# Patient Record
Sex: Female | Born: 1935 | Race: White | Hispanic: No | Smoking: Former smoker
Health system: Southern US, Community
[De-identification: ages and names within clinical notes are randomized; demographics above are authoritative.]

## PROBLEM LIST (undated history)

## (undated) DIAGNOSIS — J302 Other seasonal allergic rhinitis: Secondary | ICD-10-CM

## (undated) DIAGNOSIS — Z8619 Personal history of other infectious and parasitic diseases: Secondary | ICD-10-CM

## (undated) DIAGNOSIS — J449 Chronic obstructive pulmonary disease, unspecified: Secondary | ICD-10-CM

## (undated) DIAGNOSIS — K5790 Diverticulosis of intestine, part unspecified, without perforation or abscess without bleeding: Secondary | ICD-10-CM

## (undated) DIAGNOSIS — J90 Pleural effusion, not elsewhere classified: Secondary | ICD-10-CM

## (undated) DIAGNOSIS — L4050 Arthropathic psoriasis, unspecified: Secondary | ICD-10-CM

## (undated) DIAGNOSIS — F32A Depression, unspecified: Secondary | ICD-10-CM

## (undated) DIAGNOSIS — F329 Major depressive disorder, single episode, unspecified: Secondary | ICD-10-CM

## (undated) DIAGNOSIS — Z9981 Dependence on supplemental oxygen: Secondary | ICD-10-CM

## (undated) DIAGNOSIS — K222 Esophageal obstruction: Secondary | ICD-10-CM

## (undated) DIAGNOSIS — I5032 Chronic diastolic (congestive) heart failure: Secondary | ICD-10-CM

## (undated) DIAGNOSIS — G620 Drug-induced polyneuropathy: Secondary | ICD-10-CM

## (undated) DIAGNOSIS — Z1379 Encounter for other screening for genetic and chromosomal anomalies: Secondary | ICD-10-CM

## (undated) DIAGNOSIS — T451X5A Adverse effect of antineoplastic and immunosuppressive drugs, initial encounter: Secondary | ICD-10-CM

## (undated) DIAGNOSIS — K449 Diaphragmatic hernia without obstruction or gangrene: Secondary | ICD-10-CM

## (undated) DIAGNOSIS — Z800 Family history of malignant neoplasm of digestive organs: Secondary | ICD-10-CM

## (undated) DIAGNOSIS — K52832 Lymphocytic colitis: Secondary | ICD-10-CM

## (undated) DIAGNOSIS — E039 Hypothyroidism, unspecified: Secondary | ICD-10-CM

## (undated) DIAGNOSIS — M797 Fibromyalgia: Secondary | ICD-10-CM

## (undated) DIAGNOSIS — F419 Anxiety disorder, unspecified: Secondary | ICD-10-CM

## (undated) DIAGNOSIS — M810 Age-related osteoporosis without current pathological fracture: Secondary | ICD-10-CM

## (undated) DIAGNOSIS — R180 Malignant ascites: Secondary | ICD-10-CM

## (undated) DIAGNOSIS — I480 Paroxysmal atrial fibrillation: Secondary | ICD-10-CM

## (undated) DIAGNOSIS — D500 Iron deficiency anemia secondary to blood loss (chronic): Secondary | ICD-10-CM

## (undated) DIAGNOSIS — M25559 Pain in unspecified hip: Secondary | ICD-10-CM

## (undated) DIAGNOSIS — K529 Noninfective gastroenteritis and colitis, unspecified: Secondary | ICD-10-CM

## (undated) DIAGNOSIS — K219 Gastro-esophageal reflux disease without esophagitis: Secondary | ICD-10-CM

## (undated) DIAGNOSIS — Z8719 Personal history of other diseases of the digestive system: Secondary | ICD-10-CM

## (undated) DIAGNOSIS — K589 Irritable bowel syndrome without diarrhea: Secondary | ICD-10-CM

## (undated) DIAGNOSIS — C569 Malignant neoplasm of unspecified ovary: Secondary | ICD-10-CM

## (undated) HISTORY — PX: KNEE ARTHROSCOPY W/ LATERAL RELEASE: SHX1873

## (undated) HISTORY — DX: Irritable bowel syndrome, unspecified: K58.9

## (undated) HISTORY — DX: Major depressive disorder, single episode, unspecified: F32.9

## (undated) HISTORY — DX: Encounter for other screening for genetic and chromosomal anomalies: Z13.79

## (undated) HISTORY — DX: Malignant neoplasm of unspecified ovary: C56.9

## (undated) HISTORY — PX: LAPAROSCOPIC CHOLECYSTECTOMY: SUR755

## (undated) HISTORY — DX: Depression, unspecified: F32.A

## (undated) HISTORY — PX: COLONOSCOPY: SHX174

## (undated) HISTORY — DX: Esophageal obstruction: K22.2

## (undated) HISTORY — DX: Gastro-esophageal reflux disease without esophagitis: K21.9

## (undated) HISTORY — DX: Chronic obstructive pulmonary disease, unspecified: J44.9

## (undated) HISTORY — PX: OTHER SURGICAL HISTORY: SHX169

## (undated) HISTORY — DX: Fibromyalgia: M79.7

## (undated) HISTORY — DX: Age-related osteoporosis without current pathological fracture: M81.0

## (undated) HISTORY — DX: Arthropathic psoriasis, unspecified: L40.50

## (undated) HISTORY — DX: Diverticulosis of intestine, part unspecified, without perforation or abscess without bleeding: K57.90

## (undated) HISTORY — DX: Personal history of other diseases of the digestive system: Z87.19

## (undated) HISTORY — PX: EXAM UNDER ANESTHESIA WITH MANIPULATION OF KNEE: SHX5816

## (undated) HISTORY — DX: Paroxysmal atrial fibrillation: I48.0

## (undated) HISTORY — PX: TOTAL KNEE ARTHROPLASTY: SHX125

## (undated) HISTORY — DX: Family history of malignant neoplasm of digestive organs: Z80.0

## (undated) HISTORY — DX: Hypothyroidism, unspecified: E03.9

## (undated) HISTORY — DX: Personal history of other infectious and parasitic diseases: Z86.19

## (undated) HISTORY — DX: Pain in unspecified hip: M25.559

## (undated) HISTORY — DX: Lymphocytic colitis: K52.832

## (undated) HISTORY — DX: Diaphragmatic hernia without obstruction or gangrene: K44.9

---

## 1987-06-09 ENCOUNTER — Encounter: Payer: Self-pay | Admitting: Internal Medicine

## 1998-12-20 ENCOUNTER — Encounter: Payer: Self-pay | Admitting: Internal Medicine

## 2000-08-23 ENCOUNTER — Encounter: Payer: Self-pay | Admitting: Internal Medicine

## 2000-09-27 ENCOUNTER — Encounter: Payer: Self-pay | Admitting: Internal Medicine

## 2001-11-10 ENCOUNTER — Ambulatory Visit (HOSPITAL_COMMUNITY): Admission: RE | Admit: 2001-11-10 | Discharge: 2001-11-10 | Payer: Self-pay | Admitting: Internal Medicine

## 2001-11-10 ENCOUNTER — Encounter: Payer: Self-pay | Admitting: Internal Medicine

## 2002-03-06 ENCOUNTER — Ambulatory Visit (HOSPITAL_COMMUNITY): Admission: RE | Admit: 2002-03-06 | Discharge: 2002-03-06 | Payer: Self-pay | Admitting: Internal Medicine

## 2002-03-06 ENCOUNTER — Encounter: Payer: Self-pay | Admitting: Internal Medicine

## 2002-06-18 ENCOUNTER — Encounter: Admission: RE | Admit: 2002-06-18 | Discharge: 2002-06-18 | Payer: Self-pay | Admitting: Internal Medicine

## 2002-06-18 ENCOUNTER — Encounter: Payer: Self-pay | Admitting: Internal Medicine

## 2002-10-11 ENCOUNTER — Emergency Department (HOSPITAL_COMMUNITY): Admission: EM | Admit: 2002-10-11 | Discharge: 2002-10-11 | Payer: Self-pay | Admitting: Emergency Medicine

## 2002-10-11 ENCOUNTER — Encounter: Payer: Self-pay | Admitting: Emergency Medicine

## 2002-11-26 ENCOUNTER — Ambulatory Visit (HOSPITAL_COMMUNITY): Admission: RE | Admit: 2002-11-26 | Discharge: 2002-11-26 | Payer: Self-pay | Admitting: Internal Medicine

## 2002-11-26 ENCOUNTER — Encounter: Payer: Self-pay | Admitting: Internal Medicine

## 2003-09-08 ENCOUNTER — Inpatient Hospital Stay (HOSPITAL_COMMUNITY): Admission: RE | Admit: 2003-09-08 | Discharge: 2003-09-14 | Payer: Self-pay | Admitting: Orthopedic Surgery

## 2003-09-27 ENCOUNTER — Encounter (HOSPITAL_COMMUNITY): Admission: RE | Admit: 2003-09-27 | Discharge: 2003-10-27 | Payer: Self-pay | Admitting: Orthopedic Surgery

## 2003-10-11 ENCOUNTER — Encounter (HOSPITAL_COMMUNITY): Admission: RE | Admit: 2003-10-11 | Discharge: 2003-11-10 | Payer: Self-pay | Admitting: Orthopedic Surgery

## 2003-12-10 ENCOUNTER — Ambulatory Visit (HOSPITAL_COMMUNITY): Admission: RE | Admit: 2003-12-10 | Discharge: 2003-12-10 | Payer: Self-pay | Admitting: Internal Medicine

## 2003-12-20 ENCOUNTER — Ambulatory Visit (HOSPITAL_BASED_OUTPATIENT_CLINIC_OR_DEPARTMENT_OTHER): Admission: RE | Admit: 2003-12-20 | Discharge: 2003-12-20 | Payer: Self-pay | Admitting: Orthopedic Surgery

## 2004-02-28 ENCOUNTER — Encounter (INDEPENDENT_AMBULATORY_CARE_PROVIDER_SITE_OTHER): Payer: Self-pay | Admitting: *Deleted

## 2004-02-28 ENCOUNTER — Ambulatory Visit (HOSPITAL_COMMUNITY): Admission: RE | Admit: 2004-02-28 | Discharge: 2004-02-28 | Payer: Self-pay | Admitting: Internal Medicine

## 2004-03-24 ENCOUNTER — Ambulatory Visit (HOSPITAL_COMMUNITY): Admission: RE | Admit: 2004-03-24 | Discharge: 2004-03-24 | Payer: Self-pay | Admitting: Internal Medicine

## 2004-04-03 ENCOUNTER — Encounter: Admission: RE | Admit: 2004-04-03 | Discharge: 2004-04-03 | Payer: Self-pay | Admitting: Orthopedic Surgery

## 2004-08-30 ENCOUNTER — Ambulatory Visit (HOSPITAL_COMMUNITY): Admission: RE | Admit: 2004-08-30 | Discharge: 2004-08-30 | Payer: Self-pay | Admitting: Internal Medicine

## 2004-08-30 ENCOUNTER — Ambulatory Visit: Payer: Self-pay | Admitting: Cardiology

## 2004-11-07 ENCOUNTER — Ambulatory Visit (HOSPITAL_COMMUNITY): Admission: RE | Admit: 2004-11-07 | Discharge: 2004-11-07 | Payer: Self-pay | Admitting: Internal Medicine

## 2004-12-11 ENCOUNTER — Ambulatory Visit (HOSPITAL_COMMUNITY): Admission: RE | Admit: 2004-12-11 | Discharge: 2004-12-11 | Payer: Self-pay | Admitting: Internal Medicine

## 2005-01-25 ENCOUNTER — Ambulatory Visit (HOSPITAL_COMMUNITY): Admission: RE | Admit: 2005-01-25 | Discharge: 2005-01-25 | Payer: Self-pay | Admitting: Internal Medicine

## 2005-07-25 ENCOUNTER — Encounter: Admission: RE | Admit: 2005-07-25 | Discharge: 2005-07-25 | Payer: Self-pay | Admitting: Orthopedic Surgery

## 2005-08-28 ENCOUNTER — Ambulatory Visit (HOSPITAL_COMMUNITY): Admission: RE | Admit: 2005-08-28 | Discharge: 2005-08-28 | Payer: Self-pay | Admitting: Orthopedic Surgery

## 2005-09-03 ENCOUNTER — Ambulatory Visit (HOSPITAL_COMMUNITY): Admission: RE | Admit: 2005-09-03 | Discharge: 2005-09-03 | Payer: Self-pay | Admitting: Orthopedic Surgery

## 2005-09-03 ENCOUNTER — Ambulatory Visit (HOSPITAL_BASED_OUTPATIENT_CLINIC_OR_DEPARTMENT_OTHER): Admission: RE | Admit: 2005-09-03 | Discharge: 2005-09-03 | Payer: Self-pay | Admitting: Orthopedic Surgery

## 2006-04-08 ENCOUNTER — Ambulatory Visit (HOSPITAL_COMMUNITY): Admission: RE | Admit: 2006-04-08 | Discharge: 2006-04-08 | Payer: Self-pay | Admitting: Internal Medicine

## 2006-07-03 ENCOUNTER — Ambulatory Visit (HOSPITAL_COMMUNITY): Admission: RE | Admit: 2006-07-03 | Discharge: 2006-07-03 | Payer: Self-pay | Admitting: Internal Medicine

## 2006-10-09 ENCOUNTER — Ambulatory Visit (HOSPITAL_COMMUNITY): Admission: RE | Admit: 2006-10-09 | Discharge: 2006-10-09 | Payer: Self-pay | Admitting: Internal Medicine

## 2007-02-10 ENCOUNTER — Ambulatory Visit (HOSPITAL_COMMUNITY): Admission: RE | Admit: 2007-02-10 | Discharge: 2007-02-10 | Payer: Self-pay | Admitting: Internal Medicine

## 2007-04-18 ENCOUNTER — Ambulatory Visit (HOSPITAL_COMMUNITY): Admission: RE | Admit: 2007-04-18 | Discharge: 2007-04-18 | Payer: Self-pay | Admitting: Internal Medicine

## 2007-04-29 ENCOUNTER — Encounter: Admission: RE | Admit: 2007-04-29 | Discharge: 2007-04-29 | Payer: Self-pay | Admitting: Internal Medicine

## 2008-02-11 ENCOUNTER — Ambulatory Visit (HOSPITAL_COMMUNITY): Admission: RE | Admit: 2008-02-11 | Discharge: 2008-02-11 | Payer: Self-pay | Admitting: Internal Medicine

## 2008-04-07 ENCOUNTER — Ambulatory Visit (HOSPITAL_COMMUNITY): Admission: RE | Admit: 2008-04-07 | Discharge: 2008-04-07 | Payer: Self-pay | Admitting: Internal Medicine

## 2008-04-20 ENCOUNTER — Ambulatory Visit (HOSPITAL_COMMUNITY): Admission: RE | Admit: 2008-04-20 | Discharge: 2008-04-20 | Payer: Self-pay | Admitting: Internal Medicine

## 2008-04-20 ENCOUNTER — Encounter: Payer: Self-pay | Admitting: Emergency Medicine

## 2008-04-21 ENCOUNTER — Ambulatory Visit (HOSPITAL_COMMUNITY): Admission: RE | Admit: 2008-04-21 | Discharge: 2008-04-21 | Payer: Self-pay | Admitting: Internal Medicine

## 2009-02-24 ENCOUNTER — Ambulatory Visit (HOSPITAL_COMMUNITY): Admission: RE | Admit: 2009-02-24 | Discharge: 2009-02-24 | Payer: Self-pay | Admitting: Internal Medicine

## 2009-02-24 ENCOUNTER — Encounter: Payer: Self-pay | Admitting: Internal Medicine

## 2009-05-18 ENCOUNTER — Ambulatory Visit (HOSPITAL_COMMUNITY): Admission: RE | Admit: 2009-05-18 | Discharge: 2009-05-18 | Payer: Self-pay | Admitting: Internal Medicine

## 2009-05-19 ENCOUNTER — Ambulatory Visit (HOSPITAL_COMMUNITY): Admission: RE | Admit: 2009-05-19 | Discharge: 2009-05-19 | Payer: Self-pay | Admitting: Internal Medicine

## 2009-10-13 ENCOUNTER — Encounter (INDEPENDENT_AMBULATORY_CARE_PROVIDER_SITE_OTHER): Payer: Self-pay | Admitting: *Deleted

## 2009-11-22 ENCOUNTER — Ambulatory Visit (HOSPITAL_COMMUNITY): Admission: RE | Admit: 2009-11-22 | Discharge: 2009-11-22 | Payer: Self-pay | Admitting: Internal Medicine

## 2009-12-28 ENCOUNTER — Ambulatory Visit: Payer: Self-pay | Admitting: Emergency Medicine

## 2009-12-28 DIAGNOSIS — M129 Arthropathy, unspecified: Secondary | ICD-10-CM

## 2009-12-28 DIAGNOSIS — J449 Chronic obstructive pulmonary disease, unspecified: Secondary | ICD-10-CM | POA: Insufficient documentation

## 2009-12-28 DIAGNOSIS — E039 Hypothyroidism, unspecified: Secondary | ICD-10-CM

## 2010-01-02 ENCOUNTER — Ambulatory Visit: Payer: Self-pay | Admitting: Internal Medicine

## 2010-01-02 DIAGNOSIS — R197 Diarrhea, unspecified: Secondary | ICD-10-CM

## 2010-01-02 DIAGNOSIS — R1084 Generalized abdominal pain: Secondary | ICD-10-CM | POA: Insufficient documentation

## 2010-01-02 DIAGNOSIS — Z860100 Personal history of colon polyps, unspecified: Secondary | ICD-10-CM | POA: Insufficient documentation

## 2010-01-11 ENCOUNTER — Ambulatory Visit: Payer: Self-pay | Admitting: Internal Medicine

## 2010-01-12 ENCOUNTER — Telehealth: Payer: Self-pay | Admitting: Internal Medicine

## 2010-01-16 ENCOUNTER — Encounter: Payer: Self-pay | Admitting: Internal Medicine

## 2010-01-17 ENCOUNTER — Ambulatory Visit: Payer: Self-pay | Admitting: Emergency Medicine

## 2010-01-18 ENCOUNTER — Encounter: Payer: Self-pay | Admitting: Emergency Medicine

## 2010-01-30 ENCOUNTER — Telehealth: Payer: Self-pay | Admitting: Internal Medicine

## 2010-01-31 ENCOUNTER — Ambulatory Visit: Payer: Self-pay | Admitting: Emergency Medicine

## 2010-02-20 ENCOUNTER — Ambulatory Visit: Payer: Self-pay | Admitting: Internal Medicine

## 2010-02-20 DIAGNOSIS — K5289 Other specified noninfective gastroenteritis and colitis: Secondary | ICD-10-CM

## 2010-04-17 ENCOUNTER — Ambulatory Visit: Payer: Self-pay | Admitting: Internal Medicine

## 2010-05-23 ENCOUNTER — Ambulatory Visit (HOSPITAL_COMMUNITY): Admission: RE | Admit: 2010-05-23 | Discharge: 2010-05-23 | Payer: Self-pay | Admitting: Internal Medicine

## 2010-06-19 ENCOUNTER — Telehealth: Payer: Self-pay | Admitting: Internal Medicine

## 2010-07-13 ENCOUNTER — Ambulatory Visit: Payer: Self-pay | Admitting: Internal Medicine

## 2010-08-09 ENCOUNTER — Ambulatory Visit: Payer: Self-pay | Admitting: Emergency Medicine

## 2010-08-16 ENCOUNTER — Ambulatory Visit: Payer: Self-pay | Admitting: Internal Medicine

## 2010-08-18 ENCOUNTER — Telehealth: Payer: Self-pay | Admitting: Internal Medicine

## 2010-09-19 ENCOUNTER — Ambulatory Visit (HOSPITAL_COMMUNITY)
Admission: RE | Admit: 2010-09-19 | Discharge: 2010-09-19 | Payer: Self-pay | Source: Home / Self Care | Attending: Orthopedic Surgery | Admitting: Orthopedic Surgery

## 2010-10-17 ENCOUNTER — Ambulatory Visit
Admission: RE | Admit: 2010-10-17 | Discharge: 2010-10-17 | Payer: Self-pay | Source: Home / Self Care | Attending: Internal Medicine | Admitting: Internal Medicine

## 2010-10-17 ENCOUNTER — Ambulatory Visit
Admission: RE | Admit: 2010-10-17 | Discharge: 2010-10-17 | Payer: Self-pay | Source: Home / Self Care | Attending: Emergency Medicine | Admitting: Emergency Medicine

## 2010-10-29 ENCOUNTER — Encounter: Payer: Self-pay | Admitting: Internal Medicine

## 2010-10-29 ENCOUNTER — Encounter: Payer: Self-pay | Admitting: Orthopedic Surgery

## 2010-11-09 NOTE — Assessment & Plan Note (Signed)
Summary: LOOSE STOOLS   History of Present Illness Visit Type: Initial Visit Primary GI MD: Scarlette Shorts MD Primary Provider: Asencion Noble, MD Chief Complaint: Loose stools x 2 months History of Present Illness:   77 year old white female with hypothyroidism, psoriatic arthritis, COPD, and irritable bowel syndrome. She presents herself today as a new patient regarding problems with chronic diarrhea. She has been under the GI care with Dr. Hildred Laser for over 15 years. I have reviewed greater than 30 pages of outside records. The patient reports problems with chronic diarrhea for greater than 10 years. She underwent evaluation with Dr. Laural Golden in 2001. This included flexible sigmoidoscopy with biopsies. Interestingly, biopsies were said to show "chronic colitis, nonspecific (lymphoplasmacytic) without evidence of thickened basement membrane". She was treated as IBS and apparently responded. However, she reports to me chronic stable symptoms with a 4-5 loose stools daily. Most troubling his recent difficulties with incontinence. She does have nocturnal loose stools. She reports 20 pound weight loss though this has been intentional. She denies having formed stools or going a day without stools. No mucus or blood. Some intermittent generalized lower abdominal discomfort which she has had for years. She tells me that she had tried dicyclomine for a few months without improvement. She is on no other medications. She denies using antidiarrheals. She has had multiple complete colonoscopies about every 5 years dating back to at least 1995. In March of 2000 she did have a tubular adenoma. Colonoscopies in 2005 and again in May of 2010 were negative for neoplasia. She does report having had antibiotic exposure about 6 weeks ago. However, no change in bowel symptoms thereafter. She denies new medications.   GI Review of Systems    Reports abdominal pain and  bloating.     Location of  Abdominal pain: lower abdomen.    Denies acid reflux, belching, chest pain, dysphagia with liquids, dysphagia with solids, heartburn, loss of appetite, nausea, vomiting, vomiting blood, weight loss, and  weight gain.      Reports diarrhea, fecal incontinence, and  irritable bowel syndrome.     Denies anal fissure, black tarry stools, change in bowel habit, constipation, diverticulosis, heme positive stool, hemorrhoids, jaundice, light color stool, liver problems, rectal bleeding, and  rectal pain. Preventive Screening-Counseling & Management      Drug Use:  no.      Current Medications (verified): 1)  Xopenex Hfa 45 Mcg/act Aero (Levalbuterol Tartrate) .... As Needed 2)  Spiriva Handihaler 18 Mcg Caps (Tiotropium Bromide Monohydrate) .... Once Daily 3)  Synthroid 125 Mcg Tabs (Levothyroxine Sodium) .... Once Daily 4)  Calcium 600 1500 Mg Tabs (Calcium Carbonate) .... 2 Tabs Once Daily 5)  Vitamin D .... Once Daily 6)  Multivitamins  Tabs (Multiple Vitamin) .... Once Daily  Allergies (verified): 1)  ! Codeine  Past History:  Past Medical History: Reviewed history from 12/30/2009 and no changes required. hypothyroidism psoriatic arthritis, formerly on MTX for 2 yrs, stopped in early 2010 ? irritable bowel syndrome (to see Dr Henrene Pastor) Hemorrhoids Diverticulosis Rectal Polyp GERD Family Hx of Colon Cancer Uterine Polyp Arthritis COPD  Past Surgical History: Reviewed history from 12/30/2009 and no changes required. left knee replacement 2004 Uterine Polyp Removed  Family History: Reviewed history from 12/28/2009 and no changes required. father-pacemaker colon cancer-mother lung cancer-father  Social History: Patient states former smoker.  Quit in 1982.  1ppd x 105yr divorced 1 daughter Retired  Alcohol Use - no Illicit Drug Use - no Drug Use:  no  Review of Systems       The patient complains of arthritis/joint pain.  The patient denies allergy/sinus, anemia, anxiety-new, back pain, blood in  urine, breast changes/lumps, change in vision, confusion, cough, coughing up blood, depression-new, fainting, fatigue, fever, headaches-new, hearing problems, heart murmur, heart rhythm changes, itching, menstrual pain, muscle pains/cramps, night sweats, nosebleeds, pregnancy symptoms, shortness of breath, skin rash, sleeping problems, sore throat, swelling of feet/legs, swollen lymph glands, thirst - excessive , urination - excessive , urination changes/pain, urine leakage, vision changes, and voice change.    Vital Signs:  Patient profile:   77 year old female Height:      64 inches Weight:      171.25 pounds BMI:     29.50 Pulse rate:   88 / minute Pulse rhythm:   regular BP sitting:   120 / 56  (left arm) Cuff size:   regular  Vitals Entered By: June McMurray Williams Deborra Medina) (January 02, 2010 1:40 PM)  Physical Exam  General:  Well developed, well nourished, no acute distress. Head:  Normocephalic and atraumatic. Eyes:  PERRLA, no icterus. Ears:  Normal auditory acuity. Nose:  No deformity, discharge,  or lesions. Mouth:  No deformity or lesions, Neck:  Supple; no masses or thyromegaly. Lungs:  Clear throughout to auscultation. Heart:  Regular rate and rhythm; no murmurs, rubs,  or bruits. Abdomen:  Soft, nontender and nondistended. No masses, hepatosplenomegaly or hernias noted. Normal bowel sounds. Rectal:  deferred until colonoscopy Msk:  Symmetrical with no gross deformities. Normal posture. Pulses:  Normal pulses noted. Extremities:  No clubbing, cyanosis, edema or deformities noted. Neurologic:  Alert and  oriented x4;  grossly normal neurologically. Skin:  Intact without significant lesions or rashes. Cervical Nodes:  No significant cervical adenopathy. Axillary Nodes:  No significant axillary adenopathy. Inguinal Nodes:  No significant inguinal adenopathy. Psych:  Alert and cooperative. Normal mood and affect.   Impression & Recommendations:  Problem # 1:  DIARRHEA  (ICD-787.91) Chronic diarrhea as described. The patient carries a diagnosis of irritable bowel syndrome. Of interest, biopsies in 2000 suggesting lymphocytic colitis. As well, nocturnal symptoms mitigating against irritable bowel.  Plan: #1. Colonoscopy with biopsies. The nature of the procedure as well as the risks, benefits, and alternatives have been reviewed. She understood and agreed to proceed. #2. Movi prep prescribed. The patient instructed on its use. #3. Imodium one to 2 daily p.r.n. diarrhea  Problem # 2:  ABDOMINAL PAIN -GENERALIZED (ICD-789.07) lower abdominal discomfort likely related to colonic spasm regardless of cause. Will hopefully improve one the underlying cause of diarrhea elucidated and treated  Problem # 3:  FAMILY HX COLON CANCER (ICD-V16.0) in mother, advanced age  Problem # 21:  PERSONAL HX COLONIC POLYPS (ICD-V12.72) documented tubular adenoma in 2000. No recurrent neoplasia in 2005 or 2010.Marland Kitchen Routine surveillance in 5 years.  Other Orders: Colonoscopy (Colon)  Patient Instructions: 1)  Colonoscopy [place] 01/05/10 2:30 pm arrive at 1:30 pm 2)  Colonoscopy and Flexible Sigmoidoscopy brochure given.  3)  Movi prep instructions given to patient and Rx. sent to your pharmacy for you to pick up. 4)  The medication list was reviewed and reconciled.  All changed / newly prescribed medications were explained.  A complete medication list was provided to the patient / caregiver. 5)  copy printed and given to patient.  Makenley Shimp [facility]  January 02, 2010 2:33 PM 6)  Copy: Dr. Asencion Noble Prescriptions: MOVIPREP 100 GM  SOLR (PEG-KCL-NACL-NASULF-NA ASC-C) As  per prep instructions.  #1 x 0   Entered by:   Randye Lobo NCMA   Authorized by:   [name] MD   Signed by:   Mahayla Haddaway NCMA on 01/02/2010   Method used:   Electronically to        [facility] (retail)       [address]       [place], [place]  [address]       Ph:  [phone]       Fax: [phone]   RxID:   [id number]

## 2010-11-09 NOTE — Letter (Signed)
Summary: [facility] Instructions  [facility]  [address] [place], [place] [address]   Phone: [phone]  Fax: [phone]       [name]    Jan 30, 1933    MRN: [id number]        Procedure Day /Date:THURSDAY, 01/05/10     Arrival Time:1:30 PM       Procedure Time:2:30 PM     Location of Procedure:                    [facility] (4th Floor)                        PREPARATION FOR COLONOSCOPY WITH MOVIPREP   Starting NOW do not eat nuts, seeds, popcorn, corn, beans, peas,  salads, or any raw vegetables.  Do not take any fiber supplements (e.g. Metamucil, Citrucel, and Benefiber).  THE DAY BEFORE YOUR PROCEDURE         DATE:01/04/10  ONG:[id number]  1.  Drink clear liquids the entire day-NO SOLID FOOD  2.  Do not drink anything colored red or purple.  Avoid juices with pulp.  No orange juice.  3.  Drink at least 64 oz. (8 glasses) of fluid/clear liquids during the day to prevent dehydration and help the prep work efficiently.  CLEAR LIQUIDS INCLUDE: Water Jello Ice Popsicles Tea (sugar ok, no milk/cream) Powdered fruit flavored drinks Coffee (sugar ok, no milk/cream) Gatorade Juice: apple, white grape, white cranberry  Lemonade Clear bullion, consomm, broth Carbonated beverages (any kind) Strained chicken noodle soup Hard Candy                             4.  In the morning, mix first dose of MoviPrep solution:    Empty 1 Pouch A and 1 Pouch B into the disposable container    Add lukewarm drinking water to the top line of the container. Mix to dissolve    Refrigerate (mixed solution should be used within 24 hrs)  5.  Begin drinking the prep at 5:00 p.m. The MoviPrep container is divided by 4 marks.   Every 15 minutes drink the solution down to the next mark (approximately 8 oz) until the full liter is complete.   6.  Follow completed prep with 16 oz of clear liquid of your choice (Nothing red or purple).  Continue to drink  clear liquids until bedtime.  7.  Before going to bed, mix second dose of MoviPrep solution:    Empty 1 Pouch A and 1 Pouch B into the disposable container    Add lukewarm drinking water to the top line of the container. Mix to dissolve    Refrigerate  THE DAY OF YOUR PROCEDURE      DATE: 01/05/10 GMW:[id number]  Beginning at 9:30 a.m. (5 hours before procedure):         1. Every 15 minutes, drink the solution down to the next mark (approx 8 oz) until the full liter is complete.  2. Follow completed prep with 16 oz. of clear liquid of your choice.    3. You may drink clear liquids until 12:30 PM (2 HOURS BEFORE PROCEDURE).   MEDICATION INSTRUCTIONS  Unless otherwise instructed, you should take regular prescription medications with a small sip of water   as early as possible the morning of your procedure.         OTHER INSTRUCTIONS  You will need a responsible adult at least 75 years of age to accompany you and drive you home.   This person must remain in the waiting room during your procedure.  Wear loose fitting clothing that is easily removed.  Leave jewelry and other valuables at home.  However, you may wish to bring a book to read or  an iPod/MP3 player to listen to music as you wait for your procedure to start.  Remove all body piercing jewelry and leave at home.  Total time from sign-in until discharge is approximately 2-3 hours.  You should go home directly after your procedure and rest.  You can resume normal activities the  day after your procedure.  The day of your procedure you should not:   Drive   Make legal decisions   Operate machinery   Drink alcohol   Return to work  You will receive specific instructions about eating, activities and medications before you leave.    The above instructions have been reviewed and explained to me by   _______________________    I fully understand and can verbalize these instructions  _____________________________ Date _________

## 2010-11-09 NOTE — Letter (Signed)
Summary: New Patient letter  [facility]  [address] [place], [place] [address]   Phone: [phone]  Fax: [phone]       10/13/2009 MRN: [id number]  [name] [address] [place], [place]  [address]  Dear [name],  Welcome to the Gastroenterology Division at [facility].    You are scheduled to see Dr. Marina Goodell on 11-23-09 at 9:30a.m. on the 3rd floor at [facility], [address]  We ask that you try to arrive at our office 15 minutes prior to your appointment time to allow for check-in.  We would like you to complete the enclosed self-administered evaluation form prior to your visit and bring it with you on the day of your appointment.  We will review it with you.  Also, please bring a complete list of all your medications or, if you prefer, bring the medication bottles and we will list them.  Please bring your insurance card so that we may make a copy of it.  If your insurance requires a referral to see a specialist, please bring your referral form from your primary care physician.  Co-payments are due at the time of your visit and may be paid by cash, check or credit card.     Your office visit will consist of a consult with your physician (includes a physical exam), any laboratory testing he/she may order, scheduling of any necessary diagnostic testing (e.g. x-ray, ultrasound, CT-scan), and scheduling of a procedure (e.g. Endoscopy, Colonoscopy) if required.  Please allow enough time on your schedule to allow for any/all of these possibilities.    If you cannot keep your appointment, please call [phone] to cancel or reschedule prior to your appointment date.  This allows [place] the opportunity to schedule an appointment for another patient in need of care.  If you do not cancel or reschedule by 5 p.m. the business day prior to your appointment date, you will be charged a $50.00 late cancellation/no-show fee.    Thank you for choosing  [facility] for your medical needs.  We appreciate the opportunity to care for you.  Please visit [place] at our website  to learn more about our practice.                     Sincerely,                                                             The Gastroenterology Division

## 2010-11-09 NOTE — Assessment & Plan Note (Signed)
Summary: COPD   Visit Type:  Follow-up Copy to:  n/a Primary Provider/Referring Provider:  Asencion Noble, MD  CC:  COPD...patient states her breathing has improved on Symbicort.  History of Present Illness: 77 yo former smoker (20 pk-yrs), hx of lymphocytic colitis/IBS on prednisone, hypothyroidism, psoriatic arthritis formerly on MTX, OA s/p knee replacement. Presents for evaluation of slowly progressive dyspnea. Bothers her with walking, climbing stairs. Also mentions frequent bouts of bronchitis with apparent associated bronchospasm. Had an acute bronchitis in 2/11, treated with pred + abx + SABA (xopenex due to tachycardia with albuterol). Then started on Spiriva. Now better but still SOB with exertion.   ROV 01/31/10 -- returns for f/u and after PFTs. Feels that she has benefitted from the Spiriva, her exertional symptoms and nighttime symptoms are much improved. Has had PFTs as below.   ROV 08/09/10 -- f/u for COPD (moderate AFL on spiro w no BD response). She was on Spiriva and felt that it helped her breathing, but it bothered her UA - made it dry, hoarse, throat burning. No allergy symptoms. She has been taking the xopenex. C/o some soreness on tongue, ? thrush.   ROV 10/17/10 -- moderate COPD, had been off pred for IBS/colitis since our last visit, just restarted by Dr Henrene Pastor. She doesn't think her breathing worsened off the pred. Tried Symbicort but thout the Spiriva helped her breathing more. She didn't have any side effects from the Symbicort. She is interested in retrying the Spiriva to see if she can tolerate. No flares or exacerbations since last visit. Rare xopenex use.   Current Medications (verified): 1)  Xopenex Hfa 45 Mcg/act Aero (Levalbuterol Tartrate) .... As Needed 2)  Synthroid 112 Mcg Tabs (Levothyroxine Sodium) .... Take One By Mouth Once Daily 3)  Calcium 600 1500 Mg Tabs (Calcium Carbonate) .... 2 Tabs Once Daily 4)  Vitamin D 400 Unit Tabs (Cholecalciferol) .... Take One  By Mouth Once Daily 5)  Multivitamins  Tabs (Multiple Vitamin) .... Once Daily 6)  Symbicort 80-4.5 Mcg/act Aero (Budesonide-Formoterol Fumarate) .... 2 Puffs Two Times A Day 7)  Prednisone 20 Mg Tabs (Prednisone) .... 2 By Mouth Daily For Ibs 8)  Lomotil 2.5-0.025 Mg Tabs (Diphenoxylate-Atropine) .Marland Kitchen.. 1-2 By Mouth Three Times A Day As Needed  Allergies: 1)  ! Codeine  Vital Signs:  Patient profile:   77 year old female Height:      64 inches (162.56 cm) Weight:      185.25 pounds (84.20 kg) BMI:     31.91 O2 Sat:      94 % on Room air Temp:     97.9 degrees F (36.61 degrees C) oral Pulse rate:   90 / minute BP sitting:   140 / 70  (right arm) Cuff size:   regular  Vitals Entered By: Francesca Jewett CMA (October 17, 2010 1:48 PM)  O2 Sat at Rest %:  94 O2 Flow:  Room air CC: COPD...patient states her breathing has improved on Symbicort Comments Medications reviewed with patient Francesca Jewett CMA  October 17, 2010 1:48 PM   Physical Exam  General:  normal appearance and healthy appearing.   Head:  normocephalic and atraumatic Eyes:  conjunctiva and sclera clear Nose:  no deformity, discharge, inflammation, or lesions Mouth:  no deformity or lesions, no evidence of thrush Neck:  no masses, thyromegaly, or abnormal cervical nodes Lungs:  no wheezing, clear Heart:  regular rate and rhythm, S1, S2 without murmurs, rubs, gallops, or clicks Abdomen:  not examined Msk:  no deformity or scoliosis noted with normal posture Extremities:  no edema, no clubbing Neurologic:  non-focal Skin:  intact without lesions or rashes Psych:  alert and cooperative; normal mood and affect; normal attention span and concentration   Impression & Recommendations:  Problem # 1:  COPD (ICD-496) We will stop Symbicort and retry Spiriva once daily.  Rinse and gargle after using the Spiriva.  If you have throat irritation on the Spiriva, please go back to the Symbicort 2 puffs two times a day  Use  xopenex as needed  Call our office to let us know which medication we are going to stay with Follow up with Dr Lamonte Sakai in 6 monthys or as needed   Medications Added to Medication List This Visit: 1)  Prednisone 20 Mg Tabs (Prednisone) .... 2 by mouth daily for ibs  Other Orders: Est. Patient Level IV ([address])  Patient Instructions: 1)  We will stop Symbicort and retry Spiriva once daily.  2)  Rinse and gargle after using the Spiriva.  3)  If you have throat irritation on the Spiriva, please go back to the Symbicort 2 puffs two times a day  4)  Use xopenex as needed  5)  Call our office to let us know which medication we are going to stay with 6)  Follow up with Dr Lamonte Sakai in 6 monthys or as needed

## 2010-11-09 NOTE — Letter (Signed)
Summary: [facility]  [facility]   Imported By: Maryln Gottron 01/06/2010 11:20:36  _____________________________________________________________________  External Attachment:    Type:   Image     Comment:   External Document

## 2010-11-09 NOTE — Miscellaneous (Signed)
Summary: Entocort order  Clinical Lists Changes  Medications: Added new medication of ENTOCORT EC 3 MG XR24H-CAP (BUDESONIDE) Take 3 capsules together every morning - Signed Rx of ENTOCORT EC 3 MG XR24H-CAP (BUDESONIDE) Take 3 capsules together every morning;  #90 x 11;  Signed;  Entered by: Teryl Lucy RN;  Authorized by: Hilarie Fredrickson MD;  Method used: Electronically to [facility]*, [address] [place], [place], [place], [place]  [address], Ph: [phone], Fax: [phone]    Prescriptions: ENTOCORT EC 3 MG XR24H-CAP (BUDESONIDE) Take 3 capsules together every morning  #90 x 11   Entered by:   Teryl Lucy RN   Authorized by:   Hilarie Fredrickson MD   Signed by:   Teryl Lucy RN on 01/16/2010   Method used:   Electronically to        [facility]* (retail)       [address]       [place], [place]  [address]       Ph: [phone]       Fax: [phone]   RxID:   [id number]

## 2010-11-09 NOTE — Assessment & Plan Note (Signed)
Summary: dyspnea, COPD   Visit Type:  Initial Consult Primary Provider/Referring Provider:  Dr. Asencion Noble  CC:  Pulmonary Consult for SOB with activity.  .  History of Present Illness: 77 yo former smoker (20 pk-yrs), hx of hypothyroidism, psoriatic arthritis formerly on MTX, OA s/p knee replacement. Presents for evaluation of slowly progressive dyspnea. Bothers her with walking, climbing stairs. Also mentions frequent bouts of bronchitis with apparent associated bronchospasm. Had an acute bronchitis in 2/11, treated with pred + abx + SABA (xopenex due to tachycardia with albuterol). Then started on Spiriva. Now better but still SOB with exertion.   Preventive Screening-Counseling & Management  Alcohol-Tobacco     Smoking Status: quit  Current Medications (verified): 1)  Xopenex Hfa 45 Mcg/act Aero (Levalbuterol Tartrate) .... As Needed 2)  Spiriva Handihaler 18 Mcg Caps (Tiotropium Bromide Monohydrate) .... Once Daily 3)  Synthroid 125 Mcg Tabs (Levothyroxine Sodium) .... Once Daily 4)  Calcium 600 1500 Mg Tabs (Calcium Carbonate) .... 2 Tabs Once Daily 5)  Vitamin D .... Once Daily 6)  Multivitamins  Tabs (Multiple Vitamin) .... Once Daily  Allergies (verified): 1)  ! Codeine  Past History:  Past Medical History: hypothyroidism psoriatic arthritis, formerly on MTX for 2 yrs, stopped in early 2010 ? irritable bowel syndrome (to see Dr Henrene Pastor)  Past Surgical History: left knee replacement 2004  Family History: father-pacemaker colon cancer-mother lung cancer-father  Social History: Patient states former smoker.  Quit in 1982.  1ppd x 38yr divorced 1 daughter Retired  Smoking Status:  quit  Review of Systems       The patient complains of shortness of breath with activity, non-productive cough, and joint stiffness or pain.  The patient denies shortness of breath at rest, productive cough, coughing up blood, chest pain, irregular heartbeats, acid heartburn,  indigestion, loss of appetite, weight change, abdominal pain, difficulty swallowing, sore throat, tooth/dental problems, headaches, nasal congestion/difficulty breathing through nose, sneezing, itching, ear ache, anxiety, depression, hand/feet swelling, rash, change in color of mucus, and fever.    Vital Signs:  Patient profile:   77year old female Height:      64 inches Weight:      173 pounds BMI:     29.80 O2 Sat:      99 % on Room air Temp:     97.4 degrees F oral Pulse rate:   99 / minute BP sitting:   132 / 76  (right arm) Cuff size:   regular  Vitals Entered By: CRaymondo BandRN (December 28, 2009 2:01 PM)  O2 Flow:  Room air CC: Pulmonary Consult for SOB with activity.   Comments Medications reviewed with patient Daytime contact number verified with patient. CRaymondo BandRN  December 28, 2009 2:01 PM    Physical Exam  General:  normal appearance and healthy appearing.   Head:  normocephalic and atraumatic Eyes:  conjunctiva and sclera clear Nose:  no deformity, discharge, inflammation, or lesions Mouth:  no deformity or lesions Neck:  no masses, thyromegaly, or abnormal cervical nodes Lungs:  distant, few LLL insp crackles that clear with deep insp, no wheeze during normal resp cycle but she does wheeze on a forced exp Heart:  regular rate and rhythm, S1, S2 without murmurs, rubs, gallops, or clicks Abdomen:  not examined Msk:  no deformity or scoliosis noted with normal posture Extremities:  no edema, no clubbing Neurologic:  non-focal Skin:  intact without lesions or rashes Psych:  alert and cooperative; normal mood  and affect; normal attention span and concentration   Impression & Recommendations:  Problem # 1:  COPD (ICD-496) PFT's from 2009 now available - shows moderate to severe AFL without BD response. FEV1 1.11 (51% pred). CXR is reassuring (11/22/09) and I doubt this is related to MTX.  - continue Spiriva - as needed xopenex - walking oximetry - repeat  PFT - f/u next available  Medications Added to Medication List This Visit: 1)  Xopenex Hfa 45 Mcg/act Aero (Levalbuterol tartrate) .... As needed 2)  Spiriva Handihaler 18 Mcg Caps (Tiotropium bromide monohydrate) .... Once daily 3)  Synthroid 125 Mcg Tabs (Levothyroxine sodium) .... Once daily 4)  Calcium 600 1500 Mg Tabs (Calcium carbonate) .... 2 tabs once daily 5)  Vitamin D  .... Once daily 6)  Multivitamins Tabs (Multiple vitamin) .... Once daily  Other Orders: Consultation Level IV (81859)  Patient Instructions: 1)  Continue your Spiriva once daily for now 2)  Use xopenex 2 puffs up to every 4 hours if needed for shortness of breath.  3)  We will obtain pulmonary function testing and compare this to your study from 2009. 4)  Walking oximetry today showed that you do not need to wear oxygen. 5)  Follow up with Dr Lamonte Sakai with full PFTs, next available visit.    Immunization History:  Influenza Immunization History:    Influenza:  historical (07/08/2009)  Pneumovax Immunization History:    Pneumovax:  historical (07/08/2008)   Appended Document: dyspnea, COPD Ambulatory Pulse Oximetry  Resting; HR__90___    02 Sat__95___  Lap1 (185 feet)   HR__118___   02 Sat__94___ Lap2 (185 feet)   HR__122___   02 Sat__93___    Lap3 (185 feet)   HR__125___   02 Sat__94___  _X__Test Completed without Difficulty ___Test Stopped due to:  Parke Poisson CNA  December 28, 2009 3:12 PM    Clinical Lists Changes  Orders: Added new Service order of Pulse Oximetry, Ambulatory [phone]) - Signed

## 2010-11-09 NOTE — Assessment & Plan Note (Signed)
Summary: post colon f/u - lymphocytic colitis   History of Present Illness Visit Type: Follow-up Visit Primary GI MD: [name] MD Primary Provider: [name], MD Chief Complaint: diarrhea, patient states that she has acute issues for 2-3 days then some relief and then acute diarrhea returns History of Present Illness:   77 year old female with hypothyroidism, psoriatic arthritis, COPD, and irritable bowel syndrome. She was evaluated initially on January 02, 2010 for diarrhea. She subsequently underwent complete colonoscopy with random colon biopsies April 6. Biopsies revealed lymphocytic colitis. She was subsequently started on Entocort 9 mg daily. She presents today for followup. The patient continues with intermittent loose stools. However, her problem has improved since initiating Entocort. She reports less frequency, less urgency, and no incontinence. She is tolerating the medication well. She also uses Imodium infrequently. No new problems or issues. Chronic medical problems are stable. She does report a rare episode of heartburn with dietary indiscretion.   GI Review of Systems    Reports bloating and  heartburn.      Denies abdominal pain, acid reflux, belching, chest pain, dysphagia with liquids, dysphagia with solids, loss of appetite, nausea, vomiting, vomiting blood, weight loss, and  weight gain.      Reports diarrhea.     Denies anal fissure, black tarry stools, change in bowel habit, constipation, diverticulosis, fecal incontinence, heme positive stool, hemorrhoids, irritable bowel syndrome, jaundice, light color stool, liver problems, rectal bleeding, and  rectal pain.    Current Medications (verified): 1)  Xopenex Hfa 45 Mcg/act Aero (Levalbuterol Tartrate) .... As Needed 2)  Spiriva Handihaler 18 Mcg Caps (Tiotropium Bromide Monohydrate) .... Once Daily 3)  Synthroid 125 Mcg Tabs (Levothyroxine Sodium) .... Once Daily 4)  Calcium 600 1500 Mg Tabs (Calcium Carbonate) .... 2  Tabs Once Daily 5)  Vitamin D .... Once Daily 6)  Multivitamins  Tabs (Multiple Vitamin) .... Once Daily 7)  Entocort Ec 3 Mg Xr24h-Cap (Budesonide) .... Take 3 Capsules Together Every Morning  Allergies (verified): 1)  ! Codeine  Past History:  Past Medical History: Reviewed history from 12/30/2009 and no changes required. hypothyroidism psoriatic arthritis, formerly on MTX for 2 yrs, stopped in early 2010 ? irritable bowel syndrome (to see Dr Marina Goodell) Hemorrhoids Diverticulosis Rectal Polyp GERD Family Hx of Colon Cancer Uterine Polyp Arthritis COPD  Past Surgical History: Reviewed history from 12/30/2009 and no changes required. left knee replacement 2004 Uterine Polyp Removed  Family History: Reviewed history from 12/28/2009 and no changes required. father-pacemaker colon cancer-mother lung cancer-father  Social History: Reviewed history from 01/02/2010 and no changes required. Patient states former smoker.  Quit in 1982.  1ppd x 39yrs divorced 1 daughter Retired  Alcohol Use - no Illicit Drug Use - no  Review of Systems       The patient complains of arthritis/joint pain, shortness of breath, sleeping problems, sore throat, and voice change.  The patient denies allergy/sinus, anemia, anxiety-new, back pain, blood in urine, breast changes/lumps, change in vision, confusion, cough, coughing up blood, depression-new, fainting, fatigue, fever, headaches-new, hearing problems, heart murmur, heart rhythm changes, itching, menstrual pain, muscle pains/cramps, night sweats, nosebleeds, pregnancy symptoms, skin rash, swelling of feet/legs, swollen lymph glands, thirst - excessive , urination - excessive , urination changes/pain, urine leakage, and vision changes.    Vital Signs:  Patient profile:   77 year old female Height:      64 inches Weight:      171.13 pounds BMI:     29.48 Pulse rate:  80 / minute Pulse rhythm:   regular BP sitting:   112 / 60  (left  arm) Cuff size:   regular  Vitals Entered By: June McMurray CMA Duncan Dull) (Feb 20, 2010 8:46 AM)  Physical Exam  General:  Well developed, well nourished, no acute distress. Head:  Normocephalic and atraumatic. Eyes:  PERRLA, no icterus. Lungs:  Clear throughout to auscultation. Heart:  Regular rate and rhythm; no murmurs, rubs,  or bruits. Abdomen:  Soft, nontender and nondistended. No masses, hepatosplenomegaly or hernias noted. Normal bowel sounds. Pulses:  Normal pulses noted. Extremities:  no edema Neurologic:  alert and oriented Skin:  no jaundice Psych:  Alert and cooperative. Normal mood and affect.   Impression & Recommendations:  Problem # 1:  LYMPHOCYTIC COLITIS (ICD-558.9) lymphocytic colitis as a cause for chronic diarrhea. Some improvement since initiating Entocort.  Plan: #1. Continue Entocort 9 mg daily #2. Prescribed Lomotil one p.o. t.i.d. p.r.n. #3. Routine office followup in 8 weeks  Problem # 2:  PERSONAL HX COLONIC POLYPS (ICD-V12.72) routine followup do 2016  Problem # 3:  DIARRHEA (ICD-787.91) please see #1 above  Patient Instructions: 1)  Lomotil #90 1 by mouth three times a day as needed 2)  Please schedule a follow-up appointment in 8 weeks.  3)  The medication list was reviewed and reconciled.  All changed / newly prescribed medications were explained.  A complete medication list was provided to the patient / caregiver. 4)  Copy: Dr. [name] Prescriptions: LOMOTIL 2.5-0.025 MG TABS (DIPHENOXYLATE-ATROPINE) take 1 by mouth three times a day as needed  #90 x 6   Entered by:   Milford Cage NCMA   Authorized by:   Hilarie Fredrickson MD   Signed by:   Makesha Belitz NCMA on 02/20/2010   Method used:   Print then Give to Patient   RxID:   [id number]

## 2010-11-09 NOTE — Assessment & Plan Note (Signed)
Summary: COPD   Visit Type:  Follow-up Primary Provider/Referring Provider:  Asencion Noble, MD  CC:  ROV and SOB better since started spirivia not SOB when lying down.  History of Present Illness: 77 yo former smoker (20 pk-yrs), hx of hypothyroidism, psoriatic arthritis formerly on MTX, OA s/p knee replacement. Presents for evaluation of slowly progressive dyspnea. Bothers her with walking, climbing stairs. Also mentions frequent bouts of bronchitis with apparent associated bronchospasm. Had an acute bronchitis in 2/11, treated with pred + abx + SABA (xopenex due to tachycardia with albuterol). Then started on Spiriva. Now better but still SOB with exertion.   ROV 01/31/10 -- returns for f/u and after PFTs. Feels that she has benefitted from the Spiriva, her exertional symptoms and nighttime symptoms are much improved. Has had PFTs as below.   Current Medications (verified): 1)  Xopenex Hfa 45 Mcg/act Aero (Levalbuterol Tartrate) .... As Needed 2)  Spiriva Handihaler 18 Mcg Caps (Tiotropium Bromide Monohydrate) .... Once Daily 3)  Synthroid 125 Mcg Tabs (Levothyroxine Sodium) .... Once Daily 4)  Calcium 600 1500 Mg Tabs (Calcium Carbonate) .... 2 Tabs Once Daily 5)  Vitamin D .... Once Daily 6)  Multivitamins  Tabs (Multiple Vitamin) .... Once Daily 7)  Entocort Ec 3 Mg Xr24h-Cap (Budesonide) .... Take 3 Capsules Together Every Morning  Allergies (verified): 1)  ! Codeine  Vital Signs:  Patient profile:   77 year old female Height:      64 inches Weight:      173.38 pounds O2 Sat:      95 % on Room air Temp:     97.9 degrees F oral Pulse rate:   97 / minute BP sitting:   132 / 62  (right arm) Cuff size:   regular  O2 Flow:  Room air CC: ROV, SOB better since started spirivia not SOB when lying down Comments Meds and allergies updated   Physical Exam  General:  normal appearance and healthy appearing.   Head:  normocephalic and atraumatic Eyes:  conjunctiva and sclera  clear Nose:  no deformity, discharge, inflammation, or lesions Mouth:  no deformity or lesions Neck:  no masses, thyromegaly, or abnormal cervical nodes Lungs:  distant, few LLL insp crackles that clear with deep insp, no wheeze during normal resp cycle but she does wheeze on a forced exp Heart:  regular rate and rhythm, S1, S2 without murmurs, rubs, gallops, or clicks Abdomen:  not examined Msk:  no deformity or scoliosis noted with normal posture Extremities:  no edema, no clubbing Neurologic:  non-focal Skin:  intact without lesions or rashes Psych:  alert and cooperative; normal mood and affect; normal attention span and concentration   Impression & Recommendations:  Problem # 1:  COPD (ICD-496) Much improved on Spiriva. Do not believe we need to add to the regimen at this time.   Other Orders: Est. Patient Level IV (45809) Prescription Created Electronically [phone])  Patient Instructions: 1)  Please continue your Spiriva once daily  2)  Use Xopenex as needed  3)  Follow up with Dr Lamonte Sakai in a year or as needed.  Prescriptions: SPIRIVA HANDIHALER 18 MCG CAPS (TIOTROPIUM BROMIDE MONOHYDRATE) once daily  #30 x 11   Entered and Authorized by:   Collene Gobble MD   Signed by:   Collene Gobble MD on 01/31/2010   Method used:   Electronically to        [facility] (retail)       [address]       [place], [place]  [address]       Ph: [phone]       Fax: [phone]   RxID:   [id number]

## 2010-11-09 NOTE — Letter (Signed)
Summary: Patient Notice- Colon Biospy Results  [facility]  [address] [place], [place] [address]   Phone: [phone]  Fax: [phone]        January 16, 2010 MRN: [id number]    [name] [address] [place], [place]  [address]    Dear Ms. Najarro,  The biopsies taken during your recent colonoscopy REVEALED LYMPHOCYTIC COLITIS. THIS MAY EXPLAIN YOUR PROBLEMS WITH DIARRHEA.  Additional information/recommendations:  __MY NURSE WILL CALL YOU REGARDING TREATMENT WITH ENTOCORT (BUDESONIDE)  __Please call [phone] to schedule a return visit to review      your condition.  __You should have a repeat colonoscopy,because of a history of polyps, examination in 5 years.    Please call us if you are having persistent problems or have questions about your condition that have not been fully answered at this time.  Sincerely,  Hilarie Fredrickson MD   This letter has been electronically signed by your physician.  Appended Document: Patient Notice- Colon Biospy Results letter mailed 4.11.11

## 2010-11-09 NOTE — Progress Notes (Signed)
Summary: Prednisone ?'s  Phone Note Call from Patient Call back at Home Phone [phone]   Caller: Patient Call For: Dr. Marina Goodell Reason for Call: Talk to Nurse Summary of Call: Prednisone ?'s Initial call taken by: Vallarie Mare,  August 18, 2010 9:18 AM  Follow-up for Phone Call        Pt. was confused on how to taper the Prednisone.  Directions given was 20 mg 1 by mouth x 2 weeks, then decrease to 10 mg by mouth once daily x 2 weeks, then stop.  F/U with you in 2 months.  Pt. was thinking you told her to keep taking the 10 mg until she comes back to see you in 2 months.  I told her I would ask you to be sure and give her a call back.  I went over directions you gave in note but she insisted I ask you.  Please advise.  Thanks. Follow-up by: Cyleigh Massaro NCMA,  August 18, 2010 11:16 AM  Additional Follow-up for Phone Call Additional follow up Details #1::        YES. AS YOU OUTLINED AND IS OUTLINED IN MY NOTE. THANK HER FOR CHECKING TO BE SURE  Called patient and notified.  thanked her for checking to be sure.  Advised her to call if she has any problems after stopping the Prednisone and prior to her ROV with Dr. Marina Goodell.  She agreed.  Kristiann Noyce [facility]  August 18, 2010 11:30 AM  Additional Follow-up by: Hilarie Fredrickson MD,  August 18, 2010 11:26 AM

## 2010-11-09 NOTE — Progress Notes (Signed)
Summary: Triage / diarrhea ? entocort  Phone Note Call from Patient Call back at Home Phone [phone]   Caller: Patient Call For: Dr. Marina Goodell Reason for Call: Talk to Nurse Summary of Call: pt said the Entocort is not working. Did not take it over the weekend. It is causing gas, bloating and diarrhea Initial call taken by: Karna Christmas,  January 30, 2010 10:01 AM  Follow-up for Phone Call        Had decrease in stools after starting Entocort on the 12th. but last Thursday night started having 3-4 stools in 24 hrs. with urgency and lot of gas but no pain so she stopped the Entocort this week end and started Immodium and had  2 stools Sun. and one so far today. No blood or mucus seen. Follow-up by: Teryl Lucy RN,  January 30, 2010 10:36 AM  Additional Follow-up for Phone Call Additional follow up Details #1::        Needs to stay on Entocort as prescribed. ok to use immodium as well Additional Follow-up by: Hilarie Fredrickson MD,  January 30, 2010 11:45 AM    Additional Follow-up for Phone Call Additional follow up Details #2::    Pt. notified of Dr.Ellard Nan's orders. Follow-up by: Teryl Lucy RN,  January 30, 2010 12:31 PM

## 2010-11-09 NOTE — Progress Notes (Signed)
Summary: Med ?  Phone Note Call from Patient Call back at Home Phone [phone]   Caller: Patient Call For: Dr Henrene Pastor Reason for Call: Talk to Nurse Details for Reason: Med Question Summary of Call: Pt does not want to take Entocort anymore, states it is giving her headaches and nausea. She has an appt 07/13/2010 but wants to know if she can come off of it now. Initial call taken by: Cora Daniels,  June 19, 2010 11:00 AM  Follow-up for Phone Call        Says she has had headaches, nausea and reflux for sometime but symptoms have increased  over the past month.Says Entocort has not helped loose stools as she still has 3-5/day with discomfort and urgency prior to stool.Denies seeing any blood or mucus in stool. Follow-up by: Abel Presto RN,  June 19, 2010 11:22 AM  Additional Follow-up for Phone Call Additional follow up Details #1::        okay to stop Entocort. Continue antidiarrheals p.r.n.Marland Kitchen Keep followup appointment in October Additional Follow-up by: [name] MD,  June 19, 2010 11:40 AM    Additional Follow-up for Phone Call Additional follow up Details #2::    Pt. ntfd. of Dr.Perry's orders. Follow-up by: Abel Presto RN,  June 19, 2010 11:50 AM

## 2010-11-09 NOTE — Procedures (Signed)
Summary: EGD/[facility]   Imported By: Laural Benes 01/06/2010 11:22:27  _____________________________________________________________________  External Attachment:    Type:   Image     Comment:   External Document

## 2010-11-09 NOTE — Op Note (Signed)
Summary: Colonoscopy-Dr. Karilyn Cota  NAME:  [name], [name]                        ACCOUNT NO.:  [id number]   MEDICAL RECORD NO.:  [id number]                   PATIENT TYPE:  AMB   LOCATION:  DAY                                  FACILITY:  [facility]   PHYSICIAN:  Lionel December, M.D.                 DATE OF BIRTH:  09-Apr-1933   DATE OF PROCEDURE:  02/28/2004  DATE OF DISCHARGE:                                 OPERATIVE REPORT   PROCEDURE:  Total colonoscopy.   INDICATIONS FOR PROCEDURE:  [name] is a 77 year old Caucasian American  female who is undergoing high risk screening colonoscopy.  Family history is  positive for colon carcinoma in her mother who initially had it in her 54s,  and she had a second colonic _________ in her 89s.  Her last colonoscopy was  in March of 2000 with removal of a small polyp which was hypoplastic.  She  has a chronic nonbloody diarrhea which has been worked up in the past and  felt to be due to IBS.  The procedure and risks were reviewed with the  patient, and informed consent was obtained.   PREOPERATIVE MEDICATIONS:  Demerol 50 mg IV, Versed 4 mg IV.   FINDINGS:  The procedure was performed in the endoscopy suite.  The  patient's vital signs and O2 saturations were monitored during the procedure  and remained stable.  The patient was placed in the left lateral recumbent  position and rectal examination performed.  No abnormality noted on external  or digital exam.  The Olympus videoscope was placed into the rectum and  advanced into the region of the sigmoid colon and beyond.  The preparation  was satisfactory.  The scope was passed into the cecum which was identified  by the appendiceal orifice and ileocecal valve.  There was a small  submucosal lipoma across from the ileocecal valve which was left alone.  As  the scope was withdrawn, the colonic mucosa was carefully examined and was  normal throughout.  The rectal mucosa similarly was normal.  The scope  was  retroflexed to examine the anorectal junction, and moderate-size hemorrhoids  were noted below the dentate line.  The endoscope was straightened and  withdrawn.  The patient tolerated the procedure well.   FINAL DIAGNOSIS:  Small cecal lipoma and external hemorrhoids.  Otherwise  normal colonoscopy.   RECOMMENDATIONS:  1. High fiber diet.  2. The patient was advised to take Benefiber 4 g everyday.  3. Levbid one-half to one tablet p.o. q.a.m.  Prescription given for 30 with     five refills.  4. She should consider her next screening exam in five years from now.      ___________________________________________  Hildred Laser, M.D.   NR/MEDQ  D:  02/28/2004  T:  02/28/2004  Job:  [id number]   cc:   Paula Compton. Willey Blade, M.D.  [address]  [place]  [place] [address]  Fax: [phone]

## 2010-11-09 NOTE — Assessment & Plan Note (Signed)
Summary: 8-WEEK F/U APPT...lymphocytic colitis   History of Present Illness Visit Type: Follow-up Visit Primary GI MD: Scarlette Shorts MD Primary Provider: Asencion Noble, MD Chief Complaint: Patient following up on her lymphocytic colitis, she states that she is still having 3 BM per day. She does state that her lower abdominal pain is much better.  History of Present Illness:   77 year old female with hypothyroidism, psoriatic arthritis, COPD, irritable bowel syndrome, and recently diagnosed lymphocytic colitis in April 2011. She has been on Entocort 9 mg daily as well as Lomotil one t.i.d. She states that the lower abdominal pressure or discomfort that she was having, is gone. She also reports no further problems with incontinence. However, she continues to complain her bowels are not normal. Typically, she is awoken with the urge to defecate at 3 or 4 AM. She goes 2 or 3 times. She will one other time during the day. Stools are not formed. She has been compliant with her medication. No other issues were problems.   GI Review of Systems      Denies abdominal pain, acid reflux, belching, bloating, chest pain, dysphagia with liquids, dysphagia with solids, heartburn, loss of appetite, nausea, vomiting, vomiting blood, weight loss, and  weight gain.      Reports diarrhea.     Denies anal fissure, black tarry stools, change in bowel habit, constipation, diverticulosis, fecal incontinence, heme positive stool, hemorrhoids, irritable bowel syndrome, jaundice, light color stool, liver problems, rectal bleeding, and  rectal pain.    Current Medications (verified): 1)  Xopenex Hfa 45 Mcg/act Aero (Levalbuterol Tartrate) .... As Needed 2)  Spiriva Handihaler 18 Mcg Caps (Tiotropium Bromide Monohydrate) .... Once Daily 3)  Synthroid 112 Mcg Tabs (Levothyroxine Sodium) .... Take One By Mouth Once Daily 4)  Calcium 600 1500 Mg Tabs (Calcium Carbonate) .... 2 Tabs Once Daily 5)  Vitamin D 400 Unit Tabs  (Cholecalciferol) .... Take One By Mouth Once Daily 6)  Multivitamins  Tabs (Multiple Vitamin) .... Once Daily 7)  Entocort Ec 3 Mg Xr24h-Cap (Budesonide) .... Take 3 Capsules Together Every Morning 8)  Lomotil 2.5-0.025 Mg Tabs (Diphenoxylate-Atropine) .... Take 1 By Mouth Three Times A Day As Needed  Allergies (verified): 1)  ! Codeine  Past History:  Past Medical History: hypothyroidism psoriatic arthritis, formerly on MTX for 2 yrs, stopped in early 2010 ? irritable bowel syndrome (to see Dr Henrene Pastor) Hemorrhoids Diverticulosis Rectal Polyp GERD Family Hx of Colon Cancer Uterine Polyp Arthritis COPD lymphocytic colitis  Past Surgical History: Reviewed history from 12/30/2009 and no changes required. left knee replacement 2004 Uterine Polyp Removed  Family History: Reviewed history from 12/28/2009 and no changes required. father-pacemaker colon cancer-mother lung cancer-father  Social History: Reviewed history from 01/02/2010 and no changes required. Patient states former smoker.  Quit in 1982.  1ppd x 87yr divorced 1 daughter Retired  Alcohol Use - no Illicit Drug Use - no  Review of Systems       The patient complains of shortness of breath, sleeping problems, and voice change.  The patient denies allergy/sinus, anemia, anxiety-new, arthritis/joint pain, back pain, blood in urine, breast changes/lumps, change in vision, confusion, cough, coughing up blood, depression-new, fainting, fatigue, fever, headaches-new, hearing problems, heart murmur, heart rhythm changes, itching, menstrual pain, muscle pains/cramps, night sweats, nosebleeds, pregnancy symptoms, skin rash, sore throat, swelling of feet/legs, swollen lymph glands, thirst - excessive , urination - excessive , urination changes/pain, urine leakage, and vision changes.    Vital Signs:  Patient profile:  77 year old female Height:      64 inches Weight:      175.8 pounds BMI:     30.29 Pulse rate:   80 /  minute Pulse rhythm:   regular BP sitting:   112 / 54  (right arm) Cuff size:   regular  Vitals Entered By: Bernita Buffy CMA Deborra Medina) (April 17, 2010 9:53 AM)  Physical Exam  General:  Well developed, well nourished, no acute distress. Head:  Normocephalic and atraumatic. Eyes:  anicteric Mouth:  no thrush Lungs:  clear Heart:  regular Abdomen:  Soft, nontender and nondistended. No masses, hepatosplenomegaly or hernias noted. Normal bowel sounds. Pulses:  Normal pulses noted. Extremities:  no edema Neurologic:  alert and oriented Skin:  no jaundice Psych:  Alert and cooperative. Normal mood and affect.   Impression & Recommendations:  Problem # 1:  LYMPHOCYTIC COLITIS (ICD-558.9) . The patient is better in terms of resolution of abdominal discomfort and no further incontinence. Still with loose stools as described. This point she should continue on current therapies.  Plan: #1. Continue Entocort 9 mg daily #2. Continue Lomotil one t.i.d. p.r.n. #3. Routine office followup in 3 months  Problem # 2:  DIARRHEA (ICD-787.91) see above  Problem # 3:  PERSONAL HX COLONIC POLYPS (ICD-V12.72) surveillance up-to-date. Due for followup 2016  Patient Instructions: 1)  Refill Entocort #270 3 by mouth once daily x 3 RFs. printed and given to patient. 2)  Please schedule a follow-up appointment in 3 months. 3)  The medication list was reviewed and reconciled.  All changed / newly prescribed medications were explained.  A complete medication list was provided to the patient / caregiver. 4)  Copy: Dr. Asencion Noble Prescriptions: ENTOCORT EC 3 MG XR24H-CAP (BUDESONIDE) Take 3 capsules together every morning  #270 x 3   Entered by:   Randye Lobo NCMA   Authorized by:   [name] MD   Signed by:   Neidra Girvan NCMA on 04/17/2010   Method used:   Print then Give to Patient   RxID:   [id number]

## 2010-11-09 NOTE — Assessment & Plan Note (Signed)
Summary: COPD   Visit Type:  Follow-up Copy to:  n/a Primary Provider/Referring Provider:  [name], MD  CC:  COPD...patient stopped Spiriva 3-4 weeks ago due to hoarseness...she also c/o increased SOB x2 weeks when lying down at night...occasional wheezing.  History of Present Illness: 77 yo former smoker (20 pk-yrs), hx of lymphocytic colitis/IBS on prednisone, hypothyroidism, psoriatic arthritis formerly on MTX, OA s/p knee replacement. Presents for evaluation of slowly progressive dyspnea. Bothers her with walking, climbing stairs. Also mentions frequent bouts of bronchitis with apparent associated bronchospasm. Had an acute bronchitis in 2/11, treated with pred + abx + SABA (xopenex due to tachycardia with albuterol). Then started on Spiriva. Now better but still SOB with exertion.   ROV 01/31/10 -- returns for f/u and after PFTs. Feels that she has benefitted from the Spiriva, her exertional symptoms and nighttime symptoms are much improved. Has had PFTs as below.   ROV 08/09/10 -- f/u for COPD (moderate AFL on spiro w no BD response). She was on Spiriva and felt that it helped her breathing, but it bothered her UA - made it dry, hoarse, throat burning. No allergy symptoms. She has been taking the xopenex. C/o some soreness on tongue, ? thrush.   Current Medications (verified): 1)  Xopenex Hfa 45 Mcg/act Aero (Levalbuterol Tartrate) .... As Needed 2)  Synthroid 112 Mcg Tabs (Levothyroxine Sodium) .... Take One By Mouth Once Daily 3)  Calcium 600 1500 Mg Tabs (Calcium Carbonate) .... 2 Tabs Once Daily 4)  Vitamin D 400 Unit Tabs (Cholecalciferol) .... Take One By Mouth Once Daily 5)  Multivitamins  Tabs (Multiple Vitamin) .... Once Daily 6)  Lomotil 2.5-0.025 Mg Tabs (Diphenoxylate-Atropine) .... Take 1 By Mouth Three Times A Day As Needed 7)  Prednisone 20 Mg Tabs (Prednisone) .... Once Daily For Ibs  Allergies (verified): 1)  ! Codeine  Vital Signs:  Patient profile:   77 year old  female Height:      64 inches (162.56 cm) Weight:      184 pounds (83.64 kg) BMI:     31.70 O2 Sat:      94 % on Room air Temp:     98.3 degrees F (36.83 degrees C) oral Pulse rate:   96 / minute BP sitting:   124 / 70  (right arm) Cuff size:   regular  Vitals Entered By: Michel Bickers CMA (August 09, 2010 4:28 PM)  O2 Sat at Rest %:  94 O2 Flow:  Room air CC: COPD...patient stopped Spiriva 3-4 weeks ago due to hoarseness...she also c/o increased SOB x2 weeks when lying down at night...occasional wheezing Comments Medications reviewed with patient Michel Bickers [facility]  August 09, 2010 4:29 PM   Physical Exam  General:  normal appearance and healthy appearing.   Head:  normocephalic and atraumatic Eyes:  conjunctiva and sclera clear Nose:  no deformity, discharge, inflammation, or lesions Mouth:  no deformity or lesions, no evidence of thrush Neck:  no masses, thyromegaly, or abnormal cervical nodes Lungs:  no wheezing, clear Heart:  regular rate and rhythm, S1, S2 without murmurs, rubs, gallops, or clicks Abdomen:  not examined Msk:  no deformity or scoliosis noted with normal posture Extremities:  no edema, no clubbing Neurologic:  non-focal Skin:  intact without lesions or rashes Psych:  alert and cooperative; normal mood and affect; normal attention span and concentration   Medications Added to Medication List This Visit: 1)  Prednisone 20 Mg Tabs (Prednisone) .... Once  daily for ibs 2)  Symbicort 80-4.5 Mcg/act Aero (Budesonide-formoterol fumarate) .... 2 puffs two times a day  Other Orders: Est. Patient Level IV (29528)  Patient Instructions: 1)  Stop Spiriva 2)  We will start Symbicort 80/4.55mcg, 2 puffs two times a day  3)  Rinse your mouth out after you use the Symbicort 4)  Use your xopenex as needed  5)  Follow up with Dr Delton Coombes in 3 months to assess your status on the new medication Prescriptions: SYMBICORT 80-4.5 MCG/ACT AERO (BUDESONIDE-FORMOTEROL FUMARATE)  2 puffs two times a day  #1 x 11   Entered and Authorized by:   Leslye Peer MD   Signed by:   Leslye Peer MD on 08/09/2010   Method used:   Electronically to        [facility] (retail)       [address]       [place], [place]  [address]       Ph: [phone]       Fax: [phone]   RxID:   [id number]    Immunization History:  Influenza Immunization History:    Influenza:  historical (07/08/2010)

## 2010-11-09 NOTE — Miscellaneous (Signed)
Summary: Orders Update pft charges  Clinical Lists Changes  Orders: Added new Service order of Carbon Monoxide diffusing w/capacity (94720) - Signed Added new Service order of Lung Volumes (94240) - Signed Added new Service order of Spirometry (Pre & Post) (94060) - Signed 

## 2010-11-09 NOTE — Progress Notes (Signed)
Summary: 2wk fu appt  Phone Note Call from Patient Call back at Home Phone [phone]   Call For: Dr Marina Goodell Summary of Call: Had a procedure yesterday and was told she needed to follow up in 2wks. Initial call taken by: Leanor Kail [facility],  January 12, 2010 9:44 AM  Follow-up for Phone Call        Given rov appt. Follow-up by: Teryl Lucy RN,  January 12, 2010 10:04 AM

## 2010-11-09 NOTE — Procedures (Signed)
Summary: Flexible Sigmoidoscopy/[facility]  Flexible Sigmoidoscopy/[facility]   Imported By: Maryln Gottron 01/06/2010 11:24:27  _____________________________________________________________________  External Attachment:    Type:   Image     Comment:   External Document

## 2010-11-09 NOTE — Op Note (Signed)
Summary: colonoscopy-Dr. Karilyn Cota  [name], TROSPER NO.:  [id number]      MEDICAL RECORD NO.:  [id number]          PATIENT TYPE:  AMB      LOCATION:  DAY                           FACILITY:  [facility]      PHYSICIAN:  Lionel December, M.D.    DATE OF BIRTH:  [birth year]      DATE OF PROCEDURE:  02/24/2009   DATE OF DISCHARGE:  02/24/2009                                  OPERATIVE REPORT      PROCEDURE:  Colonoscopy.      INDICATION:  Kadyn is a 76 year old Caucasian female who was   undergoing surveillance colonoscopy.  She has a history of colonic   polyps and furthermore family history is positive for colon carcinoma in   her mother who had initially around 71 and then had second primary at 17   years later and died of metastatic disease.  She is not having any GI   symptoms.      Procedure and risks were reviewed with the patient, and informed consent   was obtained.      MEDICATIONS FOR CONSCIOUS SEDATION:  Demerol 25 mg IV, Versed 4 mg IV.      FINDINGS:  Procedure performed in endoscopy suite.  The patient's vital   signs and O2 sats were monitored during the procedure and remained   stable.  The patient was placed in left lateral recumbent position and   rectal examination performed.  No abnormality noted on external or   digital exam.  Pentax videoscope was placed through the rectum and   advanced under vision into sigmoid colon beyond.  Preparation was   excellent.  Scope was passed into the cecum, which was identified by   ileocecal valve and appendiceal orifice.  As the scope withdrawn,   colonic mucosa was carefully examined.  No polyp, tumor masses, or   diverticular changes were noted.  Rectal mucosa similarly was normal.   Scope was retroflexed to examine anorectal junction, and small   hemorrhoids were noted below the dentate line.  Endoscope was   straightened and withdrawn.  The patient tolerated the procedure well.      The withdrawal time  was 7 minutes.      FINAL DIAGNOSIS:  Normal colonoscopy, except small external hemorrhoids.      RECOMMENDATION:  Standard instructions given.      She should concern next exam in 5 years from now.               Lionel December, M.D.   Electronically Signed            NR/MEDQ  D:  02/24/2009  T:  02/25/2009  Job:  [id number]      cc:   Kingsley Callander. Ouida Sills, MD   Fax: [phone]

## 2010-11-09 NOTE — Assessment & Plan Note (Signed)
Summary: Followup-lymphocytic colitis   History of Present Illness Visit Type: Follow-up Visit Primary GI MD: Scarlette Shorts MD Primary Jacques Fife: Asencion Noble, MD Requesting Yusef Lamp: n/a Chief Complaint: Patient here to discuss next step after discontinuing Entocort. She states she had headache, nausea and severe reflux while on medication but this has since subsided after d/cing the Entocort. She is still having lower abdomional cramping as well as diarrhea.  History of Present Illness:   77 year old female with hypothyroidism, psoriatic arthritis, COPD, irritable bowel syndrome, and lymphocytic colitis diagnosed April 2011. She was last evaluated in the office April 17, 2010. At that time she was using a combination of Entocort 9 mg daily and Lomotil one t.i.d. She did experience some improvement in her symptoms, though her bowel habits were not normal. She contacted the office a couple weeks ago complaining that Entocort resulted in headaches and heartburn. She has been off that drug since then. Currently describes having about 4 bowel movements per day which are watery. Generally several times in the morning and then after meals. Not so much in the evening. Continues with Lomotil one t.i.d. No incontinent episodes. No bleeding. Upon questioning, she has had prednisone in the past without any difficulties. She is interested about therapies for microscopic colitis.   GI Review of Systems    Reports abdominal pain, acid reflux, and  nausea.     Location of  Abdominal pain: lower abdomen.    Denies belching, bloating, chest pain, dysphagia with liquids, dysphagia with solids, heartburn, loss of appetite, vomiting, vomiting blood, weight loss, and  weight gain.      Reports diarrhea.     Denies anal fissure, black tarry stools, change in bowel habit, constipation, diverticulosis, fecal incontinence, heme positive stool, hemorrhoids, irritable bowel syndrome, jaundice, light color stool, liver problems,  rectal bleeding, and  rectal pain. Preventive Screening-Counseling & Management  Caffeine-Diet-Exercise     Does Patient Exercise: no    Current Medications (verified): 1)  Xopenex Hfa 45 Mcg/act Aero (Levalbuterol Tartrate) .... As Needed 2)  Spiriva Handihaler 18 Mcg Caps (Tiotropium Bromide Monohydrate) .... Once Daily 3)  Synthroid 112 Mcg Tabs (Levothyroxine Sodium) .... Take One By Mouth Once Daily 4)  Calcium 600 1500 Mg Tabs (Calcium Carbonate) .... 2 Tabs Once Daily 5)  Vitamin D 400 Unit Tabs (Cholecalciferol) .... Take One By Mouth Once Daily 6)  Multivitamins  Tabs (Multiple Vitamin) .... Once Daily 7)  Lomotil 2.5-0.025 Mg Tabs (Diphenoxylate-Atropine) .... Take 1 By Mouth Three Times A Day As Needed  Allergies (verified): 1)  ! Codeine  Past History:  Past Medical History: Reviewed history from 04/17/2010 and no changes required. hypothyroidism psoriatic arthritis, formerly on MTX for 2 yrs, stopped in early 2010 ? irritable bowel syndrome (to see Dr Henrene Pastor) Hemorrhoids Diverticulosis Rectal Polyp GERD Family Hx of Colon Cancer Uterine Polyp Arthritis COPD lymphocytic colitis  Past Surgical History: left knee replacement 2004 Uterine Polypectomy  Family History: father-pacemaker colon cancer-mother lung cancer-father Family History of Heart Disease: Father  Social History: Patient states former smoker.  Quit in 1982.  1ppd x 15yr divorced 1 daughter Retired  Alcohol Use - very rare Illicit Drug Use - no Patient does not get regular exercise.  Does Patient Exercise:  no  Review of Systems       The patient complains of fatigue and muscle pains/cramps.  The patient denies allergy/sinus, anemia, anxiety-new, arthritis/joint pain, back pain, blood in urine, breast changes/lumps, change in vision, confusion, cough, coughing up  blood, depression-new, fainting, fever, headaches-new, hearing problems, heart murmur, heart rhythm changes, itching,  menstrual pain, night sweats, nosebleeds, pregnancy symptoms, shortness of breath, skin rash, sleeping problems, sore throat, swelling of feet/legs, swollen lymph glands, thirst - excessive , urination - excessive , urination changes/pain, urine leakage, vision changes, and voice change.    Vital Signs:  Patient profile:   77 year old female Height:      64 inches Weight:      178.13 pounds BMI:     30.69 BSA:     1.86 Pulse rate:   76 / minute Pulse rhythm:   regular BP sitting:   100 / 60  (right arm)  Vitals Entered By: Madlyn Frankel CMA Deborra Medina) (July 13, 2010 10:55 AM)  Physical Exam  General:  Well developed, well nourished, no acute distress. Head:  Normocephalic and atraumatic. Eyes:  PERRLA, no icterus. Mouth:  No deformity or lesions, dentition normal. Lungs:  Clear throughout to auscultation. Heart:  Regular rate and rhythm; no murmurs, rubs,  or bruits. Abdomen:  Soft, nontender and nondistended. No masses, hepatosplenomegaly or hernias noted. Normal bowel sounds. Pulses:  Normal pulses noted. Extremities:  no edema Neurologic:  Alert and  oriented x4 Skin:  Intact without significant lesions or rashes. Psych:  Alert and cooperative. Normal mood and affect.   Impression & Recommendations:  Problem # 1:  COLITIS (ICD-558.9) lymphocytic colitis. Ongoing symptoms. Apparent intolerance to Entocort. Not clear however the that therapy was overwhelmingly helpful. Discussed multiple other treatment options. Have agreed on the following.  Plan: #1. Prednisone 40 mg daily for one week then 30 mg daily for one week then 20 mg daily until followup. She is aware of potential side effects. #2. May increase Lomotil use as much as 6 or 8 per day if needed #3. Office followup in about 4 weeks. We can consider other therapies such as Questran if the above unhelpful  Patient Instructions: 1)  Prednisone Rx. #100 sent to your pharmacy. 2)  Take as directed. 3)  Written  instructions given to patient. 4)  Please schedule a follow-up appointment in 4 to 6 weeks.  5)  Copy sent to : Asencion Noble, MD 6)  The medication list was reviewed and reconciled.  All changed / newly prescribed medications were explained.  A complete medication list was provided to the patient / caregiver. Prescriptions: PREDNISONE 20 MG TABS (PREDNISONE) take as directed  #100 x 0   Entered by:   Randye Lobo NCMA   Authorized by:   [name] MD   Signed by:   Tekla Malachowski NCMA on 07/13/2010   Method used:   Electronically to        [facility] (retail)       [address]       [place], [place]  [address]       Ph: [phone]       Fax: [phone]   RxID:   [id number]

## 2010-11-09 NOTE — Assessment & Plan Note (Signed)
Summary: Lymphocytic colitis (followup)   History of Present Illness Visit Type: follow up Primary GI MD: [name] MD Primary Provider: [name], MD Requesting Provider: n/a Chief Complaint: f/u lymphocytic colitis, pt is feeling much better since taking prednisone. Pt states she still has intermittant abd cramping with diarrhea. Pt states her bowels are not formed but are not watery loose like they were.  History of Present Illness:   77 year old female with hypothyroidism, psoriatic arthritis, COPD, irritable bowel syndrome, and lymphocytic colitis diagnosed April 2011. She was last evaluated in the office July 13, 2010 for ongoing problems with lower abdominal cramping and diarrhea. She did not tolerate Entocort. At the time of her last visit she was placed on prednisone 40 mg daily which has been tapered to his current dose of 20 mg daily. She reports marked improvement of her symptoms on prednisone. Particularly the 40 and 30 mg dosages. Maybe not quite as well at 20 mg a still 80% better than baseline. She takes about 4 Lomotil per day. No medication side effects were to report with either drug. We again discussed the potential long-term side effects of steroids. She is aware, stating that her sister needed chronic steroids for her sarcoidosis.   GI Review of Systems      Denies abdominal pain, acid reflux, belching, bloating, chest pain, dysphagia with liquids, dysphagia with solids, heartburn, loss of appetite, nausea, vomiting, vomiting blood, weight loss, and  weight gain.      Reports diarrhea.     Denies anal fissure, black tarry stools, change in bowel habit, constipation, diverticulosis, fecal incontinence, heme positive stool, hemorrhoids, irritable bowel syndrome, jaundice, light color stool, liver problems, rectal bleeding, and  rectal pain.    Current Medications (verified): 1)  Xopenex Hfa 45 Mcg/act Aero (Levalbuterol Tartrate) .... As Needed 2)  Synthroid 112 Mcg Tabs  (Levothyroxine Sodium) .... Take One By Mouth Once Daily 3)  Calcium 600 1500 Mg Tabs (Calcium Carbonate) .... 2 Tabs Once Daily 4)  Vitamin D 400 Unit Tabs (Cholecalciferol) .... Take One By Mouth Once Daily 5)  Multivitamins  Tabs (Multiple Vitamin) .... Once Daily 6)  Lomotil 2.5-0.025 Mg Tabs (Diphenoxylate-Atropine) .... Take 1 By Mouth Three Times A Day As Needed 7)  Prednisone 20 Mg Tabs (Prednisone) .... Once Daily For Ibs 8)  Symbicort 80-4.5 Mcg/act Aero (Budesonide-Formoterol Fumarate) .... 2 Puffs Two Times A Day  Allergies (verified): 1)  ! Codeine  Past History:  Past Medical History: Reviewed history from 04/17/2010 and no changes required. hypothyroidism psoriatic arthritis, formerly on MTX for 2 yrs, stopped in early 2010 ? irritable bowel syndrome (to see Dr Marina Goodell) Hemorrhoids Diverticulosis Rectal Polyp GERD Family Hx of Colon Cancer Uterine Polyp Arthritis COPD lymphocytic colitis  Past Surgical History: Reviewed history from 07/13/2010 and no changes required. left knee replacement 2004 Uterine Polypectomy  Family History: Reviewed history from 07/13/2010 and no changes required. father-pacemaker colon cancer-mother lung cancer-father Family History of Heart Disease: Father  Social History: Reviewed history from 07/13/2010 and no changes required. Patient states former smoker.  Quit in 1982.  1ppd x 8yrs divorced 1 daughter Retired  Alcohol Use - very rare Illicit Drug Use - no Patient does not get regular exercise.   Review of Systems  The patient denies allergy/sinus, anemia, anxiety-new, arthritis/joint pain, back pain, blood in urine, breast changes/lumps, change in vision, confusion, cough, coughing up blood, depression-new, fainting, fatigue, fever, headaches-new, hearing problems, heart murmur, heart rhythm changes, itching, menstrual pain, muscle  pains/cramps, night sweats, nosebleeds, pregnancy symptoms, shortness of breath, skin  rash, sleeping problems, sore throat, swelling of feet/legs, swollen lymph glands, thirst - excessive , urination - excessive , urination changes/pain, urine leakage, vision changes, and voice change.    Vital Signs:  Patient profile:   77 year old female Height:      64 inches Weight:      180.25 pounds BMI:     31.05 Pulse rate:   100 / minute Pulse rhythm:   regular BP sitting:   124 / 54  (left arm) Cuff size:   regular  Vitals Entered By: Christie Nottingham CMA Duncan Dull) (August 16, 2010 11:15 AM)  Physical Exam  General:  Well developed, well nourished, no acute distress. Head:  Normocephalic and atraumatic. Mouth:  No deformity or lesions. No thrush Abdomen:  Soft, nontender and nondistended. No masses, hepatosplenomegaly or hernias noted. Normal bowel sounds. Pulses:  Normal pulses noted. Extremities:  no edema Neurologic:  alert and oriented Skin:  Intact without significant lesions or rashes. Psych:  Alert and cooperative. Normal mood and affect.   Impression & Recommendations:  Problem # 1:  COLITIS (ICD-558.9) lymphocytic colitis. Significant improvement after initiation of prednisone.  Plan: #1. Continue prednisone 20 mg daily for an additional 2 weeks, then decrease to 10 mg daily for 2 weeks, then stop #2. Continue Lomotil p.r.n. #3. Routine office followup in 2 months. Contact the office in the interim for any questions or problems.  Patient Instructions: 1)  Taper down on prednisone 20mg  x 2 weeks, reduce to 10mg  x 2 weeks then stop. 2)  Please schedule a follow-up appointment in 2 months.  3)  Copy sent to : [name], MD 4)  The medication list was reviewed and reconciled.  All changed / newly prescribed medications were explained.  A complete medication list was provided to the patient / caregiver.

## 2010-11-09 NOTE — Assessment & Plan Note (Signed)
Summary: Followup- lymphocytic colitis   History of Present Illness Visit Type: follow up Primary GI MD: [name] MD Primary Provider: [name], MD Requesting Provider: n/a Chief Complaint: 2 month follow-up colitis, pt states she has been out of Lomotil the last several days and she has had a increase in loose diarrhea. Pt denies any abd cramping or pain. History of Present Illness:   77 year old female with hypothyroidism, psoriatic arthritis, COPD, irritable bowel syndrome, and lymphocytic colitis diagnosed April 2011. She was last evaluated in the office August 16, 2010 for ongoing management of her lymphocytic colitis. She did not respond to Entocort therapy. She was subsequently placed on prednisone. This resulted in marked improvement in symptoms. At the time of her last visit she was to continue prednisone 20 mg daily for 2 weeks then 10 mg daily for 2 weeks, then stop. As well Lomotil p.r.n. She did notice that her problems with diarrhea and urgency worsened with prednisone dosage below 20 mg. As well significant problems over the past week as she ran out of Lomotil. Currently describing multiple an urgent bowel movements, though not as bad as her initial presentation. She went to bathroom 4-5 times already this morning. She tolerated her prednisone well. They have discussed issues regarding long-term use of prednisone. She is aware.   GI Review of Systems      Denies abdominal pain, acid reflux, belching, bloating, chest pain, dysphagia with liquids, dysphagia with solids, heartburn, loss of appetite, nausea, vomiting, vomiting blood, weight loss, and  weight gain.      Reports diarrhea.     Denies anal fissure, black tarry stools, change in bowel habit, constipation, diverticulosis, fecal incontinence, heme positive stool, hemorrhoids, irritable bowel syndrome, jaundice, light color stool, liver problems, rectal bleeding, and  rectal pain.    Current Medications (verified): 1)   Xopenex Hfa 45 Mcg/act Aero (Levalbuterol Tartrate) .... As Needed 2)  Synthroid 112 Mcg Tabs (Levothyroxine Sodium) .... Take One By Mouth Once Daily 3)  Calcium 600 1500 Mg Tabs (Calcium Carbonate) .... 2 Tabs Once Daily 4)  Vitamin D 400 Unit Tabs (Cholecalciferol) .... Take One By Mouth Once Daily 5)  Multivitamins  Tabs (Multiple Vitamin) .... Once Daily 6)  Symbicort 80-4.5 Mcg/act Aero (Budesonide-Formoterol Fumarate) .... 2 Puffs Two Times A Day  Allergies (verified): 1)  ! Codeine  Past History:  Past Medical History: Reviewed history from 04/17/2010 and no changes required. hypothyroidism psoriatic arthritis, formerly on MTX for 2 yrs, stopped in early 2010 ? irritable bowel syndrome (to see Dr Marina Goodell) Hemorrhoids Diverticulosis Rectal Polyp GERD Family Hx of Colon Cancer Uterine Polyp Arthritis COPD lymphocytic colitis  Past Surgical History: Reviewed history from 07/13/2010 and no changes required. left knee replacement 2004 Uterine Polypectomy  Family History: Reviewed history from 07/13/2010 and no changes required. father-pacemaker colon cancer-mother lung cancer-father Family History of Heart Disease: Father  Social History: Reviewed history from 07/13/2010 and no changes required. Patient states former smoker.  Quit in 1982.  1ppd x 57yrs divorced 1 daughter Retired  Alcohol Use - very rare Illicit Drug Use - no Patient does not get regular exercise.   Review of Systems  The patient denies allergy/sinus, anemia, anxiety-new, arthritis/joint pain, back pain, blood in urine, breast changes/lumps, change in vision, confusion, cough, coughing up blood, depression-new, fainting, fatigue, fever, headaches-new, hearing problems, heart murmur, heart rhythm changes, itching, menstrual pain, muscle pains/cramps, night sweats, nosebleeds, pregnancy symptoms, shortness of breath, skin rash, sleeping problems, sore throat, swelling  of feet/legs, swollen lymph  glands, thirst - excessive , urination - excessive , urination changes/pain, urine leakage, vision changes, and voice change.    Vital Signs:  Patient profile:   77 year old female Height:      64 inches Weight:      183.13 pounds BMI:     31.55 Pulse rate:   100 / minute Pulse rhythm:   regular BP sitting:   126 / 64  (right arm) Cuff size:   regular  Vitals Entered By: Christie Nottingham CMA Duncan Dull) (October 17, 2010 11:16 AM)  Physical Exam  General:  Well developed, well nourished, no acute distress. Head:  Normocephalic and atraumatic. Eyes:  PERRLA, no icterus. Mouth:  No deformity or lesions. No thrush Lungs:  Clear throughout to auscultation. Heart:  Regular rate and rhythm; no murmurs, rubs,  or bruits. Abdomen:  Soft,obese, nontender and nondistended. No masses, hepatosplenomegaly or hernias noted. Normal bowel sounds. Pulses:  Normal pulses noted. Extremities:  no edema Neurologic:  alert and oriented Skin:  Intact without significant lesions or rashes. Psych:  Alert and cooperative. Normal mood and affect.   Impression & Recommendations:  Problem # 1:  COLITIS (ICD-558.9) lymphocytic colitis. Seems to have relapse off prednisone and Lomotil. Discuss treatment options.  Plan: #1. Reinitiated prednisone 40 mg daily for 2 weeks, then 30 mg daily for 2 weeks, then 20 mg daily until followup. Represcribed #2. Represcribed Lomotil 1-2 p.o. t.i.d. p.r.n. #3. Office followup in 3 months. Contact the office in the interim for any questions or problems.  Problem # 2:  PERSONAL HX COLONIC POLYPS (ICD-V12.72) Assessment: Comment Only  Problem # 3:  FAMILY HX COLON CANCER (ICD-V16.0) Assessment: Comment Only  Problem # 4:  DIARRHEA (ICD-787.91) please see #1 above  Patient Instructions: 1)  Prednisone  40 mg x 2 weeks, then 2)  30 mg x 2 weeks, then 3)  20 mg daily until you follow up with Dr. Marina Goodell in 3 months. 4)  Please schedule a follow-up appointment in 3  months. 5)  Lomotil #100 take 1-2 by mouth three times a day as needed  6)  Copy sent to : [name], MD 7)  The medication list was reviewed and reconciled.  All changed / newly prescribed medications were explained.  A complete medication list was provided to the patient / caregiver. Prescriptions: LOMOTIL 2.5-0.025 MG TABS (DIPHENOXYLATE-ATROPINE) 1-2 by mouth three times a day as needed  #100 x 3   Entered by:   Milford Cage NCMA   Authorized by:   Hilarie Fredrickson MD   Signed by:   Loella Hickle NCMA on 10/17/2010   Method used:   Print then Give to Patient   RxID:   [id number] PREDNISONE 20 MG TABS (PREDNISONE) take as directed  #100 x 1   Entered by:   Milford Cage NCMA   Authorized by:   Hilarie Fredrickson MD   Signed by:   Aurelie Dicenzo NCMA on 10/17/2010   Method used:   Electronically to        [facility] (retail)       [address]       [place], [place]  [address]       Ph: [phone]       Fax: [phone]   RxID:   [id number]

## 2010-11-09 NOTE — Miscellaneous (Signed)
Summary: PFT   Pulmonary Function Test Date: 01/17/2010 Height (in.): 47 Gender: Female  Pre-Spirometry FVC    Value: 2.54 L/min   Pred: 2.62 L/min     % Pred: 97 % FEV1    Value: 1.19 L     Pred: 1.82 L     % Pred: 65 % FEV1/FVC  Value: 47 %     Pred: 71 %    FEF 25-75  Value: 0.37 L/min   Pred: 2.11 L/min     % Pred: 18 %  Post-Spirometry FVC    Value: 2.51 L/min   Pred: 2.62 L/min     % Pred: 96 % FEV1    Value: 1.26 L     Pred: 1.82 L     % Pred: 69 % FEV1/FVC  Value: 50 %     Pred: 71 %    FEF 25-75  Value: 0.40 L/min   Pred: 2.11 L/min     % Pred: 19 %  Lung Volumes TLC    Value: 5.13 L   % Pred: 112 % RV    Value: 2.60 L   % Pred: 137 % DLCO    Value: 12.8 %   % Pred: 64 % DLCO/VA  Value: 3.88 %   % Pred: 115 %  Comments: Moderate AFL, no response to BD. Hyperinflated volumes. Decreased DLCO that corrects for alveolar volume. RSB Clinical Lists Changes  Observations: Added new observation of PFT COMMENTS: Moderate AFL, no response to BD. Hyperinflated volumes. Decreased DLCO that corrects for alveolar volume. RSB (01/18/2010 17:02) Added new observation of DLCO/VA%EXP: 115 % (01/18/2010 17:02) Added new observation of DLCO/VA: 3.88 % (01/18/2010 17:02) Added new observation of DLCO % EXPEC: 64 % (01/18/2010 17:02) Added new observation of DLCO: 12.8 % (01/18/2010 17:02) Added new observation of RV % EXPECT: 137 % (01/18/2010 17:02) Added new observation of RV: 2.60 L (01/18/2010 17:02) Added new observation of TLC % EXPECT: 112 % (01/18/2010 17:02) Added new observation of TLC: 5.13 L (01/18/2010 17:02) Added new observation of FEF2575%EXPS: 19 % (01/18/2010 17:02) Added new observation of PSTFEF25/75P: 2.11  (01/18/2010 17:02) Added new observation of PSTFEF25/75%: 0.40 L/min (01/18/2010 17:02) Added new observation of FEV1FVCPRDPS: 71 % (01/18/2010 17:02) Added new observation of PSTFEV1/FVC: 50 % (01/18/2010 17:02) Added new observation of POSTFEV1%PRD: 69 %  (01/18/2010 17:02) Added new observation of FEV1PRDPST: 1.82 L (01/18/2010 17:02) Added new observation of POST FEV1: 1.26 L/min (01/18/2010 17:02) Added new observation of POST FVC%EXP: 96 % (01/18/2010 17:02) Added new observation of FVCPRDPST: 2.62 L/min (01/18/2010 17:02) Added new observation of POST FVC: 2.51 L (01/18/2010 17:02) Added new observation of FEF % EXPEC: 18 % (01/18/2010 17:02) Added new observation of FEF25-75%PRE: 2.11 L/min (01/18/2010 17:02) Added new observation of FEF 25-75%: 0.37 L/min (01/18/2010 17:02) Added new observation of FEV1/FVC PRE: 71 % (01/18/2010 17:02) Added new observation of FEV1/FVC: 47 % (01/18/2010 17:02) Added new observation of FEV1 % EXP: 65 % (01/18/2010 17:02) Added new observation of FEV1 PREDICT: 1.82 L (01/18/2010 17:02) Added new observation of FEV1: 1.19 L (01/18/2010 17:02) Added new observation of FVC % EXPECT: 97 % (01/18/2010 17:02) Added new observation of FVC PREDICT: 2.62 L (01/18/2010 17:02) Added new observation of FVC: 2.54 L (01/18/2010 17:02) Added new observation of PFT HEIGHT: 63  (01/18/2010 17:02) Added new observation of PFT DATE: 01/17/2010  (01/18/2010 17:02)

## 2010-11-09 NOTE — Procedures (Signed)
Summary: Colonoscopy  Patient: [name] Note: All result statuses are Final unless otherwise noted.  Tests: (1) Colonoscopy (COL)   COL Colonoscopy           DONE     [facility]     [address]     [place], [place]  [address]           COLONOSCOPY PROCEDURE REPORT           PATIENT:  [name], [name]  MR#:  [id number]     BIRTHDATE:  07-17-1933, 76 yrs. old  GENDER:  female     ENDOSCOPIST:  Wilhemina Bonito. Eda Keys, MD     REF. BY:  Office - Self     PROCEDURE DATE:  01/11/2010     PROCEDURE:  Colonoscopy with biopsy     ASA CLASS:  Class II     INDICATIONS:  unexplained diarrhea... Hx adenoma in 2000 w/ neg     f/u exams 2005 and 2010 (all elsewhere)     MEDICATIONS:   Fentanyl 50 mcg IV, Versed 5 mg IV           DESCRIPTION OF PROCEDURE:   After the risks benefits and     alternatives of the procedure were thoroughly explained, informed     consent was obtained.  Digital rectal exam was performed and     revealed no abnormalities.   The LB [id number] endoscope     was introduced through the anus and advanced to the cecum, which     was identified by both the appendix and ileocecal valve, without     limitations.Time to cecum = 4:07 min. The quality of the prep was     excellent, using MoviPrep.  The instrument was then slowly     withdrawn (time = 7:20 min) as the colon was fully examined.     <<PROCEDUREIMAGES>>           FINDINGS:  Complete examination.The terminal ileum appeared     normal. The colonic mucosa was normal. Moderate diverticulosis was     found in the left colon.  This was otherwise a normal examination     of the colon. Random colon biopsies taken to r/o microscopic     colitis.  Retroflexed views in the rectum revealed internal     hemorrhoids.    The scope was then withdrawn from the patient and     the procedure completed.           COMPLICATIONS:  None     ENDOSCOPIC IMPRESSION:     1) Normal terminal ileum     2) Moderate  diverticulosis in the left colon     3) Otherwise normal examination - s/p random colon bx     4) Internal hemorrhoids           RECOMMENDATIONS:     1) Await pathology results     2) call office next 1-3 days to schedule followup visit in a few     weeks     3) Follow up colonoscopy in 5 years (due to a personal hx     adenomatous polyps)           ______________________________     Wilhemina Bonito. Eda Keys, MD           CC:  [name], MD; The Patient           n.     eSIGNED:  Wilhemina Bonito. Eda Keys at 01/11/2010 03:13 PM           Valla Leaver, [id number]  Note: An exclamation mark (!) indicates a result that was not dispersed into the flowsheet. Document Creation Date: 01/11/2010 3:14 PM _______________________________________________________________________  (1) Order result status: Final Collection or observation date-time: 01/11/2010 14:58 Requested date-time:  Receipt date-time:  Reported date-time:  Referring Physician:   Ordering Physician: Fransico Setters [phone]) Specimen Source:  Source: Launa Grill Order Number: [id number] Lab site:   Appended Document: Colonoscopy RECALL COLONOSCOPY IN 5 YEARS. WILL DISCUSS BX RESULTS WITH PATIENT IN OFFICE  Appended Document: Colonoscopy CHERYL, PLEASE CALL PATIENT A LET HER KNOW THAT SHE HAS LYMPHOCYTIC COLITIS AND START ENTCORT 9MG  DAILY. FOLLOW UP O.V. IN 4 WEEKS  Appended Document: Colonoscopy recall in 5 yrs/01-2015     Procedures Next Due Date:    Colonoscopy: 01/2015  Appended Document: Colonoscopy Pt. notified of DR.Pearly Bartosik's orders.

## 2010-12-02 ENCOUNTER — Inpatient Hospital Stay (HOSPITAL_COMMUNITY)
Admission: EM | Admit: 2010-12-02 | Discharge: 2010-12-06 | DRG: 418 | Disposition: A | Discharge status: Home / Self Care | Payer: Medicare Other | Attending: General Surgery | Admitting: General Surgery

## 2010-12-02 ENCOUNTER — Emergency Department (HOSPITAL_COMMUNITY): Payer: Medicare Other

## 2010-12-02 DIAGNOSIS — K8000 Calculus of gallbladder with acute cholecystitis without obstruction: Principal | ICD-10-CM | POA: Diagnosis present

## 2010-12-02 DIAGNOSIS — N390 Urinary tract infection, site not specified: Secondary | ICD-10-CM | POA: Diagnosis present

## 2010-12-02 DIAGNOSIS — I10 Essential (primary) hypertension: Secondary | ICD-10-CM | POA: Diagnosis present

## 2010-12-02 DIAGNOSIS — J4489 Other specified chronic obstructive pulmonary disease: Secondary | ICD-10-CM | POA: Diagnosis present

## 2010-12-02 DIAGNOSIS — J449 Chronic obstructive pulmonary disease, unspecified: Secondary | ICD-10-CM | POA: Diagnosis present

## 2010-12-02 DIAGNOSIS — E039 Hypothyroidism, unspecified: Secondary | ICD-10-CM | POA: Diagnosis present

## 2010-12-02 LAB — POCT CARDIAC MARKERS
Myoglobin, poc: 63.2 ng/mL (ref 12–200)
Troponin i, poc: <0.05 ng/mL (ref 0.00–0.09)

## 2010-12-02 LAB — CBC
MCH: 30.9 pg (ref 26.0–34.0)
MCV: 93.5 fL (ref 78.0–100.0)
Platelets: 182 10*3/uL (ref 150–400)
RBC: 4.75 MIL/uL (ref 3.87–5.11)
RDW: 13.3 % (ref 11.5–15.5)
WBC: 19.7 10*3/uL — ABNORMAL HIGH (ref 4.0–10.5)

## 2010-12-02 LAB — COMPREHENSIVE METABOLIC PANEL
AST: 16 U/L (ref 0–37)
Albumin: 3.5 g/dL (ref 3.5–5.2)
BUN: 12 mg/dL (ref 6–23)
Chloride: 99 mEq/L (ref 96–112)
Creatinine, Ser: 0.99 mg/dL (ref 0.4–1.2)
GFR calc Af Amer: >60 mL/min (ref >60)
Potassium: 4 mEq/L (ref 3.5–5.1)
Total Protein: 6.4 g/dL (ref 6.0–8.3)

## 2010-12-02 LAB — DIFFERENTIAL
Basophils Relative: 0 % (ref 0–1)
Eosinophils Absolute: 0 10*3/uL (ref 0.0–0.7)
Eosinophils Relative: 0 % (ref 0–5)
Lymphs Abs: 0.9 10*3/uL (ref 0.7–4.0)
Neutrophils Relative %: 89 % — ABNORMAL HIGH (ref 43–77)

## 2010-12-02 LAB — URINALYSIS, ROUTINE W REFLEX MICROSCOPIC
Nitrite: NEGATIVE
Protein, ur: NEGATIVE mg/dL
Urobilinogen, UA: 0.2 mg/dL (ref 0.0–1.0)

## 2010-12-02 LAB — URINE MICROSCOPIC-ADD ON

## 2010-12-02 MED ORDER — IOHEXOL 300 MG/ML  SOLN
100.0000 mL | Freq: Once | INTRAMUSCULAR | Status: AC | PRN
  Administered 2010-12-02: 100 mL via INTRAVENOUS

## 2010-12-03 LAB — CBC
Hemoglobin: 13.6 g/dL (ref 12.0–15.0)
MCHC: 33.8 g/dL (ref 30.0–36.0)
RDW: 13.5 % (ref 11.5–15.5)
WBC: 20 10*3/uL — ABNORMAL HIGH (ref 4.0–10.5)

## 2010-12-03 LAB — DIFFERENTIAL
Basophils Absolute: 0 10*3/uL (ref 0.0–0.1)
Basophils Relative: 0 % (ref 0–1)
Monocytes Absolute: 1.5 10*3/uL — ABNORMAL HIGH (ref 0.1–1.0)
Neutro Abs: 17.1 10*3/uL — ABNORMAL HIGH (ref 1.7–7.7)

## 2010-12-03 LAB — COMPREHENSIVE METABOLIC PANEL
Albumin: 2.8 g/dL — ABNORMAL LOW (ref 3.5–5.2)
Alkaline Phosphatase: 33 U/L — ABNORMAL LOW (ref 39–117)
BUN: 8 mg/dL (ref 6–23)
CO2: 24 mEq/L (ref 19–32)
Chloride: 100 mEq/L (ref 96–112)
Creatinine, Ser: 0.94 mg/dL (ref 0.4–1.2)
GFR calc non Af Amer: 58 mL/min — ABNORMAL LOW (ref >60)
Glucose, Bld: 120 mg/dL — ABNORMAL HIGH (ref 70–99)
Potassium: 3.8 mEq/L (ref 3.5–5.1)
Total Bilirubin: 2.4 mg/dL — ABNORMAL HIGH (ref 0.3–1.2)

## 2010-12-03 LAB — BILIRUBIN, DIRECT: Bilirubin, Direct: 0.4 mg/dL — ABNORMAL HIGH (ref 0.0–0.3)

## 2010-12-04 ENCOUNTER — Other Ambulatory Visit: Payer: Self-pay | Admitting: General Surgery

## 2010-12-04 LAB — DIFFERENTIAL
Basophils Relative: 0 % (ref 0–1)
Eosinophils Absolute: 0 10*3/uL (ref 0.0–0.7)
Lymphs Abs: 1 10*3/uL (ref 0.7–4.0)
Neutro Abs: 14.8 10*3/uL — ABNORMAL HIGH (ref 1.7–7.7)
Neutrophils Relative %: 87 % — ABNORMAL HIGH (ref 43–77)

## 2010-12-04 LAB — HEPATIC FUNCTION PANEL
ALT: 14 U/L (ref 0–35)
AST: 13 U/L (ref 0–37)
Albumin: 2.5 g/dL — ABNORMAL LOW (ref 3.5–5.2)
Alkaline Phosphatase: 42 U/L (ref 39–117)
Total Protein: 5.6 g/dL — ABNORMAL LOW (ref 6.0–8.3)

## 2010-12-04 LAB — CBC
Platelets: 159 10*3/uL (ref 150–400)
RBC: 4.18 MIL/uL (ref 3.87–5.11)
WBC: 17 10*3/uL — ABNORMAL HIGH (ref 4.0–10.5)

## 2010-12-04 LAB — BASIC METABOLIC PANEL
Chloride: 102 mEq/L (ref 96–112)
GFR calc Af Amer: >60 mL/min (ref >60)
Potassium: 4.1 mEq/L (ref 3.5–5.1)

## 2010-12-05 LAB — DIFFERENTIAL
Eosinophils Absolute: 0 10*3/uL (ref 0.0–0.7)
Lymphocytes Relative: 4 % — ABNORMAL LOW (ref 12–46)
Lymphs Abs: 0.5 10*3/uL — ABNORMAL LOW (ref 0.7–4.0)
Neutro Abs: 11.5 10*3/uL — ABNORMAL HIGH (ref 1.7–7.7)
Neutrophils Relative %: 93 % — ABNORMAL HIGH (ref 43–77)

## 2010-12-05 LAB — COMPREHENSIVE METABOLIC PANEL
CO2: 26 mEq/L (ref 19–32)
Calcium: 8 mg/dL — ABNORMAL LOW (ref 8.4–10.5)
Creatinine, Ser: 0.85 mg/dL (ref 0.4–1.2)
GFR calc non Af Amer: >60 mL/min (ref >60)
Glucose, Bld: 155 mg/dL — ABNORMAL HIGH (ref 70–99)
Total Protein: 5.6 g/dL — ABNORMAL LOW (ref 6.0–8.3)

## 2010-12-05 LAB — CBC
HCT: 38.7 % (ref 36.0–46.0)
Hemoglobin: 12.7 g/dL (ref 12.0–15.0)
MCV: 93.3 fL (ref 78.0–100.0)
Platelets: 160 10*3/uL (ref 150–400)
RBC: 4.15 MIL/uL (ref 3.87–5.11)
WBC: 12.4 10*3/uL — ABNORMAL HIGH (ref 4.0–10.5)

## 2010-12-06 LAB — CBC
HCT: 36.3 % (ref 36.0–46.0)
MCV: 92.8 fL (ref 78.0–100.0)
RDW: 13.3 % (ref 11.5–15.5)
WBC: 13.3 10*3/uL — ABNORMAL HIGH (ref 4.0–10.5)

## 2010-12-06 LAB — COMPREHENSIVE METABOLIC PANEL
Alkaline Phosphatase: 38 U/L — ABNORMAL LOW (ref 39–117)
BUN: 11 mg/dL (ref 6–23)
Chloride: 105 mEq/L (ref 96–112)
Glucose, Bld: 179 mg/dL — ABNORMAL HIGH (ref 70–99)
Potassium: 4.1 mEq/L (ref 3.5–5.1)
Total Bilirubin: 0.7 mg/dL (ref 0.3–1.2)
Total Protein: 5.4 g/dL — ABNORMAL LOW (ref 6.0–8.3)

## 2010-12-06 LAB — DIFFERENTIAL
Eosinophils Relative: 0 % (ref 0–5)
Lymphocytes Relative: 6 % — ABNORMAL LOW (ref 12–46)
Lymphs Abs: 0.8 10*3/uL (ref 0.7–4.0)
Monocytes Relative: 6 % (ref 3–12)

## 2010-12-15 NOTE — Op Note (Signed)
NAMEBRAILYN, KILLION NO.:  [id number]  MEDICAL RECORD NO.:  [id number]           PATIENT TYPE:  LOCATION:                                 FACILITY:  PHYSICIAN:  Tilford Pillar, MD      DATE OF BIRTH:  1933-08-21  DATE OF PROCEDURE:  12/04/2010 DATE OF DISCHARGE:                              OPERATIVE REPORT   PREOPERATIVE DIAGNOSES:  Acute cholecystitis.  POSTOPERATIVE DIAGNOSIS:  Gangrenous cholecystitis.  PROCEDURE:  Laparoscopic cholecystectomy.  SURGEON:  Tilford Pillar, MD.  ANESTHESIA:  General endotracheal, local anesthetic 0.5% Sensorcaine plain.  SPECIMEN:  Gallbladder.  ESTIMATED BLOOD LOSS:  Less than 100 mL.  INDICATIONS:  The patient is a 77 year old female who presented to [facility] with right upper abdominal pain, evaluation was consistent for acute cholecystitis.  She was started initially on IV antibiotics and bowel rest and with improvement and adequate resuscitation.  It wasdiscussed with patient to proceed to the operating room.  Risks, benefits, alternatives of the laparoscopic, possible open cholecystectomy were discussed at length with the patient including but not limited to bleeding, infection, bile leak, small bowel injury, common bile duct injury as well as possibility of intraoperative cardiac and pulmonary events were discussed at length with the patient.  Her questions and concerns were addressed.  The patient was consented for the planned procedure.  PROCEDURE IN DETAIL:  The patient was taken to the operating room, was placed in the supine position on the operating room table, at which time the general anesthetic was administered.  Once the patient was asleep, she was endotracheally intubated by Anesthesia.  At this point, her abdomen was prepped with DuraPrep solution and draped in standard fashion.  A stab incision was created supraumbilically with an 11-blade scalpel, additional dissection down to the  subcutaneous tissues was carried out using a Kocher clamp which was utilized to grasp the anterior abdominal fascia anteriorly.  A Veress needle was inserted. Saline drop test was utilized to confirm intraperitoneal placement. Then, sufficient pneumoperitoneum was obtained, the 11-mm trocar was inserted over the laparoscope allowing visualization of the trocar entering into the peritoneal cavity.  At this time, the inner cannula was removed.  The laparoscope was reinserted.  There was no evidence of any trocar or Veress needle placement injury.  At this time, the remaining trocars were placed with 11-mm trocar in the epigastrium, 5-mm trocar in the midline between the two 11-mm trocars and the 5-mm trocars placed in the right lateral abdominal wall.  The patient was placed in a reverse Trendelenburg left lateral decubitus position.  The omentum was fixed to the right anterior aspect of the right lobe of the liver.  This was sharply taken down with a combination of electrocautery and sharp surgical dissection.  The gallbladder was identified and was grasped with multiple omental adhesions were noted onto the gallbladder.  The gallbladder was tense and had to be drained with a wet needle in order to adequately grasp the gallbladder anteriorly.  At this time, I began the dissection stripping the inflammatory phlegmon off the gallbladder.  During this dissection, the body of  the gallbladder was grasped with a regular grasper and during mobilization a cholecystotomy was created.  During the dissection, some bile spillage was encountered, this was copiously controlled and irrigated.  At this time, I was able to identify the infundibulum.  The cystic duct was identified.  Two EndoClip were placed proximally, one distally and the cystic artery was divided between the two most distal clips.  Similarly the cystic artery was identified.  Two EndoClip were placed proximally, one distally and the  cystic artery was divided between the two most distal clips.  At this time, electrocautery was utilized to dissect the gallbladder free from the gallbladder fossa. Once it was free, it was placed into an EndoCatch bag, placed up and over the right lobe of the liver.  At this time, the gallbladder fossa was irrigated with copious amounts of sterile saline.  There was no evidence any trocar or any ongoing bleeding or bile leak.  I did opt to place a piece of Surgicel into the gallbladder fossa for assistance of hemostasis.  At this time, I turned my attention to closure.  Using Endoclose suture passing device, a 2-0 Vicryl suture was passed through both the 11-mm trocar sites.  With these sutures in place, the gallbladder was retrieved and removed through the umbilical trocar site in an intact EndoCatch bag.  At this time, the pneumoperitoneum was evacuated.  The trocars were removed.  The Vicryl sutures were secured. The local anesthetic was instilled.  A 4-0 Monocryl was utilized to reapproximate the skin edges of all 4 trocar sites.  The skin was washed and dried with moist dry towel.  Benzoin was applied around the incision.  Half-inch Steri-Strips were placed.  Drapes were removed. The patient was allowed to come out of the general anesthetic and the patient was transferred back to regular hospital bed in stable condition.  At the conclusion of procedure, all instrument, sponge, and needle counts were correct.  The patient tolerated the procedure extremely well.     Tilford Pillar, MD     BZ/MEDQ  D:  12/04/2010  T:  12/05/2010  Job:  [id number]  Electronically Signed by Tilford Pillar MD on 12/14/2010 09:31:58 PM

## 2010-12-15 NOTE — H&P (Signed)
NAMECARYS, [name] NO.:  [id number]  MEDICAL RECORD NO.:  [id number]           PATIENT TYPE:  I  LOCATION:  A304                          FACILITY:  [facility]  PHYSICIAN:  Tilford Pillar, MD      DATE OF BIRTH:  [birth year]  DATE OF ADMISSION:  12/02/2010 DATE OF DISCHARGE:  LH                             HISTORY & PHYSICAL   CHIEF COMPLAINT:  Right upper quadrant abdominal pain.  HISTORY OF PRESENT ILLNESS:  The patient is a 77 year old female with a history of increasing right upper quadrant abdominal pain.  This started relatively acutely with some radiation to the back.  Pain was worse with movement.  She has had similar, but not as significant symptomatology in the past.  She denied any fevers, but has had some chills.  She had associated nausea and one episode of emesis, which was nonbloody.  She has not noticed any change in bowel movements.  No melena.  No hematochezia.  She has had no history of jaundice.  PAST MEDICAL HISTORY:  Hypertension, osteoarthritis, hypothyroidism, colitis for she has been recently treated with on prednisone.  PAST SURGICAL HISTORY:  She has had previous left total knee replacement.  She has also had a apparent fibroid surgery via a vaginal approach per the patient.  She has had no additional abdominal surgeries.  MEDICATIONS:  Xopenex, prednisone which she has been on for the last several months, pantoprazole, Lomotil, Spiriva and Synthroid.  ALLERGIES:  CODEINE, DEXTRAN, COLIN, VICODIN, which cause itching.  SOCIAL HISTORY:  No tobacco.  No alcohol.  No recreational drug abuse.  FAMILY HISTORY:  Noncontributory although she does have a one family member with a history of cholecystitis.  REVIEW OF SYSTEMS:  CONSTITUTIONAL:  Unremarkable.  EYES:  Unremarkable. EARS, NOSE AND THROAT:  Unremarkable.  RESPIRATORY:  Unremarkable. CARDIOVASCULAR:  Unremarkable.  GASTROINTESTINAL:  As per HPI. GENITOURINARY:  Unremarkable.   MUSCULOSKELETAL:  Arthralgias of the joints.  SKIN, ENDOCRINE, AND NEURO:  Are all unremarkable.  PHYSICAL EXAMINATION:  VITAL SIGNS:  Temperature 99.7, heart rate 92, respirations 16, blood pressure 120/64.  She is 95% O2 saturation on room air. GENERAL:  She is not in any acute distress.  She is alert and oriented x3. HEENT:  Scalp, no deformities or masses.  Eyes:  Pupils equal, round, reactive.  Extraocular movements are intact.  No conjunctival pallor. No scleral icterus.  Oral mucosa pink.  No occlusion. NECK:  Trachea is midline.  No cervical lymphadenopathy. PULMONARY:  Unlabored respiration.  She is clear to auscultation bilaterally. CARDIOVASCULAR:  Regular rate and rhythm.  No murmurs or gallops. ABDOMEN:  Decreased bowel sounds.  Abdomen is soft, obese.  She does have significant right upper quadrant abdominal pain and positive Murphy sign.  No diffuse peritoneal signs.  No hernias or masses. EXTREMITIES:  Warm and dry.  PERTINENT LABORATORY DATA AND RADIOGRAPHIC STUDIES:  CBC white blood cell count 20, hemoglobin 13.6, hematocrit 40.2, platelets 152.  Basic metabolic panel sodium 133, potassium 3.8, chloride 100, bicarb 24, BUN 8, creatinine 0.94, blood glucose 120, total bilirubin is elevated to 0.4, direct bilirubin  is 0.4.  UA demonstrates positive bacteria, positive leukocytes and blood.  CT of the abdomen and pelvis demonstrates distended gallbladder with pericholecystic fluid and stones within the gallbladder.  No biliary tree dilatation is noted.  ASSESSMENT AND PLAN:  Acute cholecystitis and urinary tract infection. At this time, plan will be to admit the patient, continue IV fluid, continue antibiotics and continue bowel rest.  At this time the risks, benefits, alternatives of surgical intervention were discussed at length with the patient including both laparoscopic and open procedures. Risks, benefits and alternatives including, but not limited risk  of bleeding, infection, bile leak, small-bowel injury, injury as well as possibility of intraoperative pulmonary events were discussed with the patient.  At this time, the patient will be continued on the conservative management with planned surgical intervention in the next 24-48 hours pending her progression.  Additionally, I did discuss with the patient due to her utilization of prednisone and she will need a stress dose of prednisone following her operation.     Tilford Pillar, MD     BZ/MEDQ  D:  12/04/2010  T:  12/04/2010  Job:  [id number]  Electronically Signed by Tilford Pillar MD on 12/14/2010 09:31:52 PM

## 2011-01-09 ENCOUNTER — Other Ambulatory Visit (HOSPITAL_COMMUNITY): Payer: Self-pay | Admitting: Internal Medicine

## 2011-01-09 DIAGNOSIS — Z139 Encounter for screening, unspecified: Secondary | ICD-10-CM

## 2011-01-11 ENCOUNTER — Ambulatory Visit (HOSPITAL_COMMUNITY)
Admission: RE | Admit: 2011-01-11 | Discharge: 2011-01-11 | Disposition: A | Discharge status: Home / Self Care | Payer: Medicare Other | Source: Ambulatory Visit | Attending: Internal Medicine | Admitting: Internal Medicine

## 2011-01-11 DIAGNOSIS — M818 Other osteoporosis without current pathological fracture: Secondary | ICD-10-CM | POA: Insufficient documentation

## 2011-01-11 DIAGNOSIS — Z139 Encounter for screening, unspecified: Secondary | ICD-10-CM

## 2011-02-12 NOTE — Discharge Summary (Signed)
  [name], [name]              ACCOUNT NO.:  [id number]  MEDICAL RECORD NO.:  [id number]           PATIENT TYPE:  I  LOCATION:  A301                          FACILITY:  [facility]  PHYSICIAN:  Tilford Pillar, MD      DATE OF BIRTH:  Oct 03, 1933  DATE OF ADMISSION:  12/02/2010 DATE OF DISCHARGE:  02/29/2012LH                              DISCHARGE SUMMARY   ADMISSION DIAGNOSIS:  Acute cholecystitis.  DISCHARGE DIAGNOSES: 1. Status post laparoscopic cholecystectomy for gangrenous     cholecystitis. 2. Hypertension. 3. Osteoarthritis. 4. Hyperthyroidism. 5. Recent history of colitis.  DISPOSITION:  Home.  BRIEF HISTORY AND PHYSICAL:  Please see admission history and physical for complete H and P.  The patient is a 77 year old female who presented to [facility] with right upper quadrant abdominal pain and back pain.  Workup was consistent for acute cholecystitis and she was admitted for continued management and intervention.  HOSPITAL COURSE:  The patient was admitted on December 02, 2010.  She was kept in n.p.o. status and was started on antibiotics.  Her symptomatology did improved and she was resuscitated adequately, and on December 04, 2010, she was taken to the operating room for a laparoscopic cholecystectomy.  At that time, it was noted that the patient did have gangrene of the gallbladder, although the procedure was able to be done laparoscopically.  She tolerated the procedure extremely well, spent a brief period in the postanesthetic care unit and then was transferred back to a surgical floor.  She continued to progress well. Her pain was controlled.  She was tolerating regular diet, and on December 06, 2010, the patient was discharged to home.  DISCHARGE INSTRUCTIONS:  The patient was continued with diet.  She is to increase activity as tolerated.  She is not to lift anything greater than 20 pounds for the next 4 weeks.  She is return to see me in the office  in 2-3 weeks.  She is to call should she have any questions, problems, or concerns.  DISCHARGE MEDICATIONS:  Please see the discharge reconciliation sheet for all discharge medications.     Tilford Pillar, MD     BZ/MEDQ  D:  02/09/2011  T:  02/09/2011  Job:  [id number]  Electronically Signed by Tilford Pillar MD on 02/12/2011 08:54:12 AM

## 2011-02-14 ENCOUNTER — Encounter: Payer: Self-pay | Admitting: Emergency Medicine

## 2011-02-15 ENCOUNTER — Ambulatory Visit (INDEPENDENT_AMBULATORY_CARE_PROVIDER_SITE_OTHER): Payer: Medicare Other | Admitting: Internal Medicine

## 2011-02-15 ENCOUNTER — Encounter: Payer: Self-pay | Admitting: Internal Medicine

## 2011-02-15 ENCOUNTER — Encounter: Payer: Self-pay | Admitting: Emergency Medicine

## 2011-02-15 ENCOUNTER — Ambulatory Visit (INDEPENDENT_AMBULATORY_CARE_PROVIDER_SITE_OTHER): Payer: Medicare Other | Admitting: Emergency Medicine

## 2011-02-15 ENCOUNTER — Telehealth: Payer: Self-pay | Admitting: Emergency Medicine

## 2011-02-15 VITALS — BP 118/64 | HR 76 | Ht 64.0 in | Wt 182.0 lb

## 2011-02-15 DIAGNOSIS — R1084 Generalized abdominal pain: Secondary | ICD-10-CM

## 2011-02-15 DIAGNOSIS — K5289 Other specified noninfective gastroenteritis and colitis: Secondary | ICD-10-CM

## 2011-02-15 DIAGNOSIS — J309 Allergic rhinitis, unspecified: Secondary | ICD-10-CM

## 2011-02-15 DIAGNOSIS — J302 Other seasonal allergic rhinitis: Secondary | ICD-10-CM

## 2011-02-15 DIAGNOSIS — K52839 Microscopic colitis, unspecified: Secondary | ICD-10-CM

## 2011-02-15 DIAGNOSIS — J449 Chronic obstructive pulmonary disease, unspecified: Secondary | ICD-10-CM

## 2011-02-15 MED ORDER — OMEPRAZOLE 20 MG PO CPDR: 20.0000 mg | DELAYED_RELEASE_CAPSULE | Freq: Every day | ORAL | Status: DC

## 2011-02-15 MED ORDER — DIPHENOXYLATE-ATROPINE 2.5-0.025 MG PO TABS: 1.0000 | ORAL_TABLET | Freq: Three times a day (TID) | ORAL | Status: DC | PRN

## 2011-02-15 NOTE — Patient Instructions (Signed)
Rx for your Lomotil printed and given to you.  You can have refills as needed per Dr. Marina Goodell. Call us if you need Refills. Taper your Prednisone. Take 15 mg daily x 1 week Then 10 mg daily x 1 week Then 5 mg daily x 1 week. Then Stop Follow-up in 2-3 months

## 2011-02-15 NOTE — Assessment & Plan Note (Signed)
Add NSW to zyrtec

## 2011-02-15 NOTE — Progress Notes (Signed)
  Subjective:    Patient ID: [name], female    DOB: 06-Jul-1933, 77 y.o.   MRN: [id number]  HPI 77 yo former smoker (20 pk-yrs), hx of lymphocytic colitis/IBS on prednisone, hypothyroidism, psoriatic arthritis formerly on MTX, OA s/p knee replacement. Presents for evaluation of slowly progressive dyspnea. Bothers her with walking, climbing stairs. Also mentions frequent bouts of bronchitis with apparent associated bronchospasm. Had an acute bronchitis in 2/11, treated with pred + abx + SABA (xopenex due to tachycardia with albuterol). Then started on Spiriva. Now better but still SOB with exertion.   ROV 01/31/10 -- returns for f/u and after PFTs. Feels that she has benefitted from the Spiriva, her exertional symptoms and nighttime symptoms are much improved. Has had PFTs as below.   ROV 08/09/10 -- f/u for COPD (moderate AFL on spiro w no BD response). She was on Spiriva and felt that it helped her breathing, but it bothered her UA - made it dry, hoarse, throat burning. No allergy symptoms. She has been taking the xopenex. C/o some soreness on tongue, ? thrush.   ROV 10/17/10 -- moderate COPD, had been off pred for IBS/colitis since our last visit, just restarted by Dr Henrene Pastor. She doesn't think her breathing worsened off the pred. Tried Symbicort but thout the Spiriva helped her breathing more. She didn't have any side effects from the Symbicort. She is interested in retrying the Spiriva to see if she can tolerate. No flares or exacerbations since last visit. Rare xopenex use.  ROV 02/15/11 -- returns for COPD. We have tried both Symbicort and Spiriva. Last time we went back on Spiriva  - she believes that it has helped, but she is having some hoarse voice. Believes that it is due to Spiriva + a worsening of her allergies. Started on zyrtec in March with some relief but still bothering her. Since last visit Prednisone has been restarted. Had emergency sgy for cholecystitis in February. Having GERD sx  at night when she lays down.    Review of Systems As above    Objective:   Physical Exam Gen: Pleasant, overwt, in no distress,  normal affect  ENT: No lesions,  mouth clear,  oropharynx clear, no postnasal drip  Neck: No JVD, no TMG, no carotid bruits  Lungs: No use of accessory muscles, no dullness to percussion, clear without rales or rhonchi  Cardiovascular: RRR, heart sounds normal, no murmur or gallops, no peripheral edema  Musculoskeletal: No deformities, no cyanosis or clubbing  Neuro: alert, non focal  Skin: Warm, no lesions or rashes           Assessment & Plan:

## 2011-02-15 NOTE — Assessment & Plan Note (Signed)
Continue spiriva + xopenex prn

## 2011-02-15 NOTE — Patient Instructions (Signed)
Please continue your Spiriva every day Continue zyrtec daily Start doing nasal saline washes every day Start omeprazole 68m daily; discuss this medication with Dr PHenrene PastorContinue to use your Xopenex inhaler as needed.  Follow up with Dr BLamonte Sakaiin 3 months or sooner if you have any problems.

## 2011-02-15 NOTE — Telephone Encounter (Signed)
Spoke with pt and notified that rx for omeprazole was just sent to pharm.  Pt verbalized understanding.

## 2011-02-15 NOTE — Assessment & Plan Note (Signed)
Having reflux sx and hoarse voice, will try PPI and follow sx. Asked her to inform Dr Henrene Pastor that we were adding omeprazole

## 2011-02-15 NOTE — Progress Notes (Signed)
HISTORY OF PRESENT ILLNESS:  [name] is a 77 y.o. female with multiple medical problems as listed below. She is followed in this office for biopsy proven lymphocytic colitis diagnosed in April 2011. She did not respond to Entocort. She subsequently responded to course of prednisone. Lomotil has been used when necessary. She was last seen in January of 2012. At that time off steroids and off Lomotil with significant diarrhea. She was interested in being retreated with steroids. We discussed the pros and cons as well as options outside of steroids. She was instructed to start prednisone 40 mg daily for 2 weeks then taper by 10 mg every 2 weeks until reaching 20 mg dosage. She was to stay on 20 mg daily until her followup in 3 months. In the interim, she was admitted to [facility] with acute gangrenous cholecystitis for which she underwent surgery for the last week of February. Currently, she is on 20 mg of prednisone daily and Lomotil 3-4 daily. On this regimen she averages 3 bowel movements per day. These are described as loose or watery. No formed stools. No new complaints or issues. Her weight is unchanged from the last visit. She sees a pulmonary specialist, and tells me that she was placed on empiric PPI therapy for hoarseness, possibly due to GERD. No GERD symptoms classically such as pyrosis, regurgitation, or dysphagia.  REVIEW OF SYSTEMS:  All non-GI ROS negative except for sinus and allergy, arthritis, shortness of breath with exertion.  Past Medical History  Diagnosis Date  . Hypothyroidism   . Psoriatic arthritis   . IBS (irritable bowel syndrome)   . Hemorrhoids   . Diverticulosis   . Rectal polyp   . GERD (gastroesophageal reflux disease)   . Family hx of colon cancer   . Uterine polyp   . Arthritis   . COPD (chronic obstructive pulmonary disease)   . Lymphocytic colitis     Past Surgical History  Procedure Date  . Knee surgery     lt.  Marland Kitchen Uterine polypectomy    . Cholecystectomy Dec 04, 2010    Social History [name]  reports that she quit smoking about 30 years ago. Her smoking use included Cigarettes. She has a 20 pack-year smoking history. She has never used smokeless tobacco. She reports that she drinks alcohol. She reports that she does not use illicit drugs.  family history includes Colon cancer in her mother; Heart disease in her father; and Lung cancer in her father.  Allergies  Allergen Reactions  . Codeine Itching       PHYSICAL EXAMINATION:  Vital signs: BP 118/64  Pulse 76  Ht 5\' 4"  (1.626 m)  Wt 182 lb (82.555 kg)  BMI 31.24 kg/m2 General: Well-developed, well-nourished, no acute distress HEENT: Sclerae are anicteric, conjunctiva pink. Oral mucosa intact Lungs: Clear Heart: Regular Abdomen: soft, nontender, nondistended, no obvious ascites, no peritoneal signs, normal bowel sounds. No organomegaly. Extremities: No edema Psychiatric: alert and oriented x3. Cooperative    ASSESSMENT:  #1. Lymphocytic colitis. Ongoing symptoms on 20 mg of prednisone and Lomotil. She wishes to come off prednisone and try Lomotil as a solo agent. #2. Hoarseness, question GERD #3. Status post recent cholecystectomy  PLAN:  #1. Taper prednisone by 5 mg every week until off #2. Continue Lomotil when necessary #3. Okay for empiric trial of PPI regarding hoarseness. Would limit to 8 weeks #4. Routine office followup in 2-3 months. Could consider Questran therapy if bowel habits remain  problematic

## 2011-02-20 NOTE — Op Note (Signed)
NAMECALIANNA, KIM              ACCOUNT NO.:  [id number]   MEDICAL RECORD NO.:  [id number]          PATIENT TYPE:  AMB   LOCATION:  DAY                           FACILITY:  [facility]   PHYSICIAN:  Hildred Laser, M.D.    DATE OF BIRTH:  09/02/1933   DATE OF PROCEDURE:  02/24/2009  DATE OF DISCHARGE:  02/24/2009                               OPERATIVE REPORT   PROCEDURE:  Colonoscopy.   INDICATION:  [name] is a 77 year old Caucasian female who was  undergoing surveillance colonoscopy.  She has a history of colonic  polyps and furthermore family history is positive for colon carcinoma in  her mother who had initially around 57 and then had second primary at 60  years later and died of metastatic disease.  She is not having any GI  symptoms.   Procedure and risks were reviewed with the patient, and informed consent  was obtained.   MEDICATIONS FOR CONSCIOUS SEDATION:  Demerol 25 mg IV, Versed 4 mg IV.   FINDINGS:  Procedure performed in endoscopy suite.  The patient's vital  signs and O2 sats were monitored during the procedure and remained  stable.  The patient was placed in left lateral recumbent position and  rectal examination performed.  No abnormality noted on external or  digital exam.  Pentax videoscope was placed through the rectum and  advanced under vision into sigmoid colon beyond.  Preparation was  excellent.  Scope was passed into the cecum, which was identified by  ileocecal valve and appendiceal orifice.  As the scope withdrawn,  colonic mucosa was carefully examined.  No polyp, tumor masses, or  diverticular changes were noted.  Rectal mucosa similarly was normal.  Scope was retroflexed to examine anorectal junction, and small  hemorrhoids were noted below the dentate line.  Endoscope was  straightened and withdrawn.  The patient tolerated the procedure well.   The withdrawal time was 7 minutes.   FINAL DIAGNOSIS:  Normal colonoscopy, except small external  hemorrhoids.   RECOMMENDATION:  Standard instructions given.   She should concern next exam in 5 years from now.      Hildred Laser, M.D.  Electronically Signed     NR/MEDQ  D:  02/24/2009  T:  02/25/2009  Job:  [id number]   cc:   Paula Compton. Willey Blade, MD  Fax: [phone]

## 2011-02-20 NOTE — Procedures (Signed)
[name], [name]              ACCOUNT NO.:  [id number]   MEDICAL RECORD NO.:  [id number]          PATIENT TYPE:  OUT   LOCATION:  RESP                          FACILITY:  [facility]   PHYSICIAN:  Edward L. Luan Pulling, M.D.DATE OF BIRTH:  1933/02/18   DATE OF PROCEDURE:  DATE OF DISCHARGE:                            PULMONARY FUNCTION TEST   1. Spirometry shows a moderate ventilatory defect with evidence of      airflow obstruction.  2. Lung volumes are normal.  3. DLCO is mildly reduced.  4. There is no significant bronchodilator effect.  5. This is consistent with COPD for asthma.      Edward L. Luan Pulling, M.D.  Electronically Signed     ELH/MEDQ  D:  04/21/2008  T:  04/21/2008  Job:  [id number]   cc:   Paula Compton. Willey Blade, MD  Fax: [phone]

## 2011-02-23 NOTE — Op Note (Signed)
NAME:  [name], [name]                        ACCOUNT NO.:  [id number]   MEDICAL RECORD NO.:  [id number]                   PATIENT TYPE:  INP   LOCATION:  5034                                 FACILITY:  [facility]   PHYSICIAN:  Audree Camel. Noemi Chapel, M.D.              DATE OF BIRTH:  22-Nov-1932   DATE OF PROCEDURE:  09/08/2003  DATE OF DISCHARGE:                                 OPERATIVE REPORT   PREOPERATIVE DIAGNOSIS:  Left knee degenerative joint disease.   POSTOPERATIVE DIAGNOSIS:  Left knee degenerative joint disease.   OPERATION PERFORMED:  1. Left total knee replacement using [employer]ics Scorpio total knee system     with #7 cemented femoral component, #7 cemented tibial component with 12     mm polyethylene flexed tibial spacer and 26 mm polyethylene cemented     patella.  2. Left knee lateral retinacular release.   SURGEON:  Audree Camel. Noemi Chapel, M.D.   ASSISTANT:  Matthew Saras, P.A.   ANESTHESIA:  General.   OPERATIVE TIME:  One hour and 20 minutes.   COMPLICATIONS:  None.   DESCRIPTION OF PROCEDURE:  [name] was brought to the operating room on  September 08, 2003 and placed on the operating table in supine position.  After an adequate level of general anesthesia was obtained, she had a Foley  catheter placed under sterile conditions and received Ancef 1 g IV  preoperatively for prophylaxis.  The knee was examined under anesthesia.  Range of motion 0 to 130 degrees.  Knee stable to ligamentous exam with  normal patella tracking.  Left leg was prepped using Sterile DuraPrep and  draped using sterile technique.  The leg was exsanguinated and a thigh  tourniquet elevated 350 mmHg.  Initially through her previous incision, a  new 15 mm incision was made over the patella.  The underlying subcutaneous  tissues were incised with skin incision.  A median arthrotomy was performed  revealing an excessive amount of normal-appearing joint fluid.  The  articular surfaces were  inspected.  She had grade 3 and 4 changes of the  patellofemoral joint.  She had 50% grade 2 and 3 changes on the femoral  condyles both medial and lateral and because of this, I elected to perform a  total knee replacement instead of a patellofemoral replacement. The medial  and lateral meniscal remnants were removed as well as the anterior cruciate  ligament.  Osteophytes were removed off the femoral condyles and tibial  plateau.  An intramedullary drill was drilled up the femoral canal for  placement of the distal femoral cutting jig which was placed in the  appropriate amount of rotation and the distal 10 mm cut was made.  The  distal femur was incised.  A #7 was found to be the appropriate size and a  #7 cutting jig was placed and then these cuts were made.  After this was  done, the proximal  tibial was exposed.  The tibial spines were removed with  an oscillating saw.  Intramedullary drill drilled down the tibial canal for  placement of the proximal tibial cutting jig which was placed in the  appropriate amount of rotation and a proximal 6 mm cut was made.  After this  was done, the Scorpio PCL cutter was placed back on the distal femur and  these cuts were made.  At this point the #7 femoral trial was placed.  The  #7 tibial base plate trial was placed and with a 12 mm polyethylene spacer,  there was found to be adequate restoration of normal alignment, excellent  stability through a full range of motion.  Tibial base plate was then marked  for rotation and the keel cut was made.  The patella was then evaluated.  She had a large medial spur which was resected.  26 mm size was noted and  found to be the appropriate size and a recessed 10 mm x 26 mm cut was made  and three locking holes were placed.  After this was done, all of the trial  components were removed.  The knee was then jet lavage irrigated with 3 L of  saline solution.  The proximal tibia was exposed and a #7 tibial  baseplate  with cement backing was hammered into position with an excellent fit with  excess cement being removed from around the edges.  A #7 femoral component  with cement backing was hammered into position also with an excellent fit  with excess cement being removed from around the edges.  The 12 mm  polyethylene flexed tibial spacer was then locked on the tibial baseplate,  knee taken through a range of motion 0 to 120 degrees with excellent  stability.  The 26 mm cement backed patella was then locked into its  recessed hole and held there with a clamp.  After the cement hardened, the  patellofemoral tracking was evaluated.  There was still some lateral  patellofemoral tightness and thus a lateral retinacular release was carried  out decompressing the patellofemoral joint and improving patellar tracking  to normal.  At this point it was felt that all of the components were of  excellent size, fit and stability.  The arthrotomy was closed with #1  Ethibond suture over two medium Hemovac drains. The subcutaneous tissues  were closed with 0 and 2-0 Vicryl.  The skin closed with skin staples.  Sterile dressings were applied.  The Hemovac injected with 0.25% Marcaine  and epinephrine and clamped.  Tourniquet was released and the patient  awakened and taken to recovery room in stable condition.  Sponge and needle  counts were correct times two at the end of this case.                                               Robert A. Noemi Chapel, M.D.    RAW/MEDQ  D:  09/08/2003  T:  09/08/2003  Job:  [id number]

## 2011-02-23 NOTE — Discharge Summary (Signed)
NAME:  FRONA, YOST                        ACCOUNT NO.:  [id number]   MEDICAL RECORD NO.:  [id number]                   PATIENT TYPE:  INP   LOCATION:  5034                                 FACILITY:  [facility]   PHYSICIAN:  Audree Camel. Noemi Chapel, M.D.              DATE OF BIRTH:  1932/10/12   DATE OF ADMISSION:  09/08/2003  DATE OF DISCHARGE:  09/14/2003                                 DISCHARGE SUMMARY   ADMISSION DIAGNOSES:  1. End-stage degenerative joint disease, left knee.  2. Hypothyroidism.  3. Degenerative disk disease of the lumbar spine.   DISCHARGE DIAGNOSES:  1. End-stage degenerative joint disease, left knee.  2. Post-traumatic degenerative joint disease, left knee.  3. Postoperative blood loss anemia.  4. Chronic obstructive pulmonary disease.  5. Hypothyroidism.  6. Hypotension.   HISTORY OF PRESENT ILLNESS:  The patient is a 77 year old white female with  a history of patella fracture in 1997.  She had an open reduction internal  fixation done at that time.  Now she has developed post-traumatic  degenerative joint disease.  She has pain at night, pain with rest,  unrelieved by anti-inflammatories, debriding arthroscopy, or Cortisone  injections or __________ injections.  She understands the risks, benefits,  and possible complications of a left total knee replacement and is without  question.   PROCEDURES:  On September 08, 2003, the patient underwent a left total knee  replacement by Dr. Noemi Chapel.  She tolerated the procedure well.  She had a  femoral nerve block done postoperatively by anesthesia.   HOSPITAL COURSE:  She was admitted postoperatively for pain control, DVT  prophylaxis, and physical therapy.  On postoperative day #1, vital signs  were stable, she was afebrile, her hemoglobin was 10.4, her BMET was within  normal limits.  PT and OT were started.  On postoperative day #2, the  patient progressed well in physical therapy, hemoglobin was 8.9, pulse was  101.  She was afebrile, surgical wound was well approximated.  On  postoperative day #3, the patient was doing well.  She had an episode of  hypotension that she was seen by the hospitalist for.  This has since  resolved.  Her vital signs have been stable with a blood pressure of 129/55.  On postoperative day #4, hemoglobin was 8.2.  The patient had some shortness  of breath and hypotension with her hemoglobin at 8.2.  Therefore, was  transfused 2 units of packed red blood cells.  On postoperative day #5, the  patient had difficulty with shortness of breath with activity.  Her  hemoglobin was 12.5.  Her TSH was low, so her thyroxine dose was reduced.  Her EKG was normal.  On postoperative day #6, vital signs were stable.  The  patient was feeling significantly better.  She does not want to go to rehab.  She would like to be discharged to home.  She was discharged to  home in  stable condition on Percocet one to two q.4-6h. p.r.n. pain, Coumadin 7.5 mg  daily until otherwise instructed, albuterol  inhaler two puffs q.4h. as needed.  She has been instructed to keep her  wound clean and dry.  She is weightbearing as tolerated.  On a regular diet.  She will call with increased pain, increased drainage, increased redness.  She will follow up with Dr. Noemi Chapel on September 21, 2003.      Kirstin Shepperson, P.A.                  Robert A. Noemi Chapel, M.D.    KS/MEDQ  D:  10/16/2003  T:  10/17/2003  Job:  [id number]

## 2011-02-23 NOTE — Op Note (Signed)
NAMESHALAN, NEAULT              ACCOUNT NO.:  [id number]   MEDICAL RECORD NO.:  [id number]          PATIENT TYPE:  AMB   LOCATION:  [place]                          FACILITY:  [facility]   PHYSICIAN:  Robert A. Noemi Chapel, M.D. DATE OF BIRTH:  Aug 24, 1933   DATE OF PROCEDURE:  09/03/2005  DATE OF DISCHARGE:                                 OPERATIVE REPORT   PREOPERATIVE DIAGNOSIS:  Left knee arthrofibrosis with loose body, status  post total knee replacement - two and a half years postoperative.   POSTOPERATIVE DIAGNOSIS:  Left knee arthrofibrosis with loose body, status  post total knee replacement - two and a half years postoperative.   PROCEDURE:  1.  Left knee examination under anesthesia followed by manipulation.  2.  Left knee arthroscopic lysis of adhesions.  3.  Left knee loose body excision.  4.  Left knee lateral retinacular release.   SURGEON:  Audree Camel. Noemi Chapel, M.D.   ASSISTANT:  Alfredo Martinez, P.A.   ANESTHESIA:  General.   OPERATIVE TIME:  45 minutes.   COMPLICATIONS:  None.   INDICATIONS FOR PROCEDURE:  Ms. Kuwahara is a 77 year old woman who is two and  a half years status post left total knee replacement.  She has had  persistent pain in the knee despite aggressive conservative care and has had  both loosening of the component and infection completely ruled out by  preoperative testing and is now to undergo arthroscopy with lysis of  adhesions due to her persistent pain.   DESCRIPTION OF PROCEDURE:  Ms. Creegan was brought to the operating room on  September 03, 2005 and placed on the operating table in the supine position.  She received Ancef 1 g IV preoperatively for prophylaxis.   Her left knee was examined.  Initial range of motion was from 0 to 120  degrees.  The knee was stable.  The ligament was examined with slight  lateral patellar tracking.  She had the knee sterilely injected with 0.25%  Marcaine with epinephrine.  The left leg was then prepped using  sterile  DuraPrep and draped using sterile technique.   Originally through an anterolateral portal, the arthroscope with the pump  attached was placed into an anteromedial portal and the arthroscopic probe  was placed.  On initial inspection of the medial compartment, the components  were well-seated and well-fitting and well-positioned.  The femoral  component well-seated as well as the tibial component and the tibial  polyethylene.  There were significant adhesions and scar tissue in the  medial gutter which was thoroughly debrided, and synovial bleeders were  cauterized.  There were significant adhesions in the notch, and these were  thoroughly debrided as well, and small bleeders were cauterized.  There was  a 5 x 7-mm loose body in the notch, and this was removed.  The lateral  compartment also showed significant adhesions and scar tissue, and this was  thoroughly debrided.  The synovial bleeders were cauterized as well.  The  patellar component was found to be intact and well-positioned.  There was  mild lateral patellar tracking.  There was  also excessive bone formation  over the lateral aspect of the patella, and a lateral retinacular release  was carried out with an ArthroCare wand without any excessive bleeding  noted.  Also,  6-mm bur was used to remove some of the excess bone laterally  lateral to the patellar component such that it did not impinge anymore on  the lateral femoral condyle component.  The medial and lateral gutters  showed significant adhesions and scarring, and this was thoroughly debrided  as well.  After this was done, flexion improved to 135 degrees.  Extension  remained at full, with normal patellar tracking.  At this point, it was felt  that all pathology had been satisfactorily addressed.  The instruments were  removed.  The portals were closed with 3-0 nylon suture and injected with  0.25% Marcaine with epinephrine and 4 mg of morphine.  Sterile  dressings  were applied.   The patient was awakened and taken to the recovery room in stable condition.   FOLLOWUP CARE:  Ms. Douglass will be followed as an outpatient on ___mepregan  fortis and Darvocet for pain with early physical therapy.  I will see her  back in the office in a week for sutures out and followup.      Robert A. Noemi Chapel, M.D.  Electronically Signed     RAW/MEDQ  D:  09/03/2005  T:  09/03/2005  Job:  [id number]

## 2011-02-23 NOTE — Op Note (Signed)
NAME:  [name], FARAONE                        ACCOUNT NO.:  [id number]   MEDICAL RECORD NO.:  [id number]                   PATIENT TYPE:  AMB   LOCATION:  [place]                                  FACILITY:  [facility]   PHYSICIAN:  Robert A. Noemi Chapel, M.D.              DATE OF BIRTH:  1933-03-21   DATE OF PROCEDURE:  12/20/2003  DATE OF DISCHARGE:                                 OPERATIVE REPORT   PREOPERATIVE DIAGNOSIS:  Left knee arthrofibrosis, status post total knee  replacement.   POSTOPERATIVE DIAGNOSIS:  Left knee arthrofibrosis, status post total knee  replacement.   PROCEDURE:  Left knee examination under anesthesia, followed by manipulation  and injection.   SURGEON:  Audree Camel. Noemi Chapel, M.D.   ANESTHESIA:  General.   OPERATIVE TIME:  Five minutes.   COMPLICATIONS:  None.   INDICATION FOR PROCEDURE:  Ms. Yeagle is a 77 year old woman who is three  months post left total knee replacement and who has had persistent  significant losses of motion with arthrofibrosis post knee replacement.  She  is now to undergo manipulation and injection.   DESCRIPTION:  Ms. Regan was brought to the operating room on December 20, 2003, placed on the operating table in supine position.  After an adequate  level of general anesthesia was obtained, her left knee was examined.  Range  of motion -3 to 95 degrees.  A gentle manipulation was carried out, breaking  up soft adhesions and improving flexion to 130, extension to -2.  The knee  was stable and patella tracked normally.  The knee was then sterilely  injected with 40 mg of Depo-Medrol and 10 mL of 0.25% Marcaine with  epinephrine.  The patient then awakened and taken to the recovery room in  stable condition.   FOLLOW-UP CARE:  Ms. Scinto will be followed as an outpatient on Percocet  and Mobic.  See her back in the office for sutures out and for a recheck and  follow-up.  Begin early aggressive physical therapy.                       Robert A. Noemi Chapel, M.D.    RAW/MEDQ  D:  12/20/2003  T:  12/20/2003  Job:  [id number]

## 2011-02-23 NOTE — Op Note (Signed)
NAME:  Laura, [name]                        ACCOUNT NO.:  [id number]   MEDICAL RECORD NO.:  [id number]                   PATIENT TYPE:  AMB   LOCATION:  [facility]                                  FACILITY:  [facility]   PHYSICIAN:  Hildred Laser, M.D.                 DATE OF BIRTH:  11/20/1932   DATE OF PROCEDURE:  02/28/2004  DATE OF DISCHARGE:                                 OPERATIVE REPORT   PROCEDURE:  Total colonoscopy.   INDICATIONS FOR PROCEDURE:  [name] is a 77 year old Caucasian American  female who is undergoing high risk screening colonoscopy.  Family history is  positive for colon carcinoma in her mother who initially had it in her 22s,  and she had a second colonic _________ in her 55s.  Her last colonoscopy was  in March of 2000 with removal of a small polyp which was hypoplastic.  She  has a chronic nonbloody diarrhea which has been worked up in the past and  felt to be due to IBS.  The procedure and risks were reviewed with the  patient, and informed consent was obtained.   PREOPERATIVE MEDICATIONS:  Demerol 50 mg IV, Versed 4 mg IV.   FINDINGS:  The procedure was performed in the endoscopy suite.  The  patient's vital signs and O2 saturations were monitored during the procedure  and remained stable.  The patient was placed in the left lateral recumbent  position and rectal examination performed.  No abnormality noted on external  or digital exam.  The Olympus videoscope was placed into the rectum and  advanced into the region of the sigmoid colon and beyond.  The preparation  was satisfactory.  The scope was passed into the cecum which was identified  by the appendiceal orifice and ileocecal valve.  There was a small  submucosal lipoma across from the ileocecal valve which was left alone.  As  the scope was withdrawn, the colonic mucosa was carefully examined and was  normal throughout.  The rectal mucosa similarly was normal.  The scope was  retroflexed to examine the  anorectal junction, and moderate-size hemorrhoids  were noted below the dentate line.  The endoscope was straightened and  withdrawn.  The patient tolerated the procedure well.   FINAL DIAGNOSIS:  Small cecal lipoma and external hemorrhoids.  Otherwise  normal colonoscopy.   RECOMMENDATIONS:  1. High fiber diet.  2. The patient was advised to take Benefiber 4 g everyday.  3. Levbid one-half to one tablet p.o. q.a.m.  Prescription given for 30 with     five refills.  4. She should consider her next screening exam in five years from now.      ___________________________________________  Hildred Laser, M.D.   NR/MEDQ  D:  02/28/2004  T:  02/28/2004  Job:  [id number]   cc:   Paula Compton. Willey Blade, M.D.  [address]  [place]  [place] [address]  Fax: [phone]

## 2011-02-23 NOTE — Consult Note (Signed)
NAME:  [name], [name]                        ACCOUNT NO.:  [id number]   MEDICAL RECORD NO.:  [id number]                   PATIENT TYPE:  INP   LOCATION:  3570                                 FACILITY:  [facility]   PHYSICIAN:  Cherene Altes, M.D.            DATE OF BIRTH:  Oct 13, 1932   DATE OF CONSULTATION:  09/09/2003  DATE OF DISCHARGE:                                   CONSULTATION   REASON FOR CONSULTATION:  Hypotension.   ATTENDING REQUESTING CONSULTATION:  Physician assistant with Dr. Noemi Chapel.   HISTORY OF PRESENT ILLNESS:  [name] is a 77 year old female who  suffered injury to her patella on the left multiple years ago.  With that,  she has had progressive degenerative disease of the left knee.  She was  admitted with end-stage degenerative joint disease of the left knee and  ultimately underwent total knee replacement per Dr. Noemi Chapel on September 08, 2003.  In the postoperative period, she has done relatively well.  She has  been using her PCA sparingly for pain control.  On the evening following her  surgery, she began to develop some hypotension with blood pressures dropping  into the 90s. This was accompanied by heart rate in the 70s to 80s.  The  patient did not appear to be in extremis per evaluation by nurses and  physician assistant.  I was called at that time to assist with care.  After  discussing the case with nursing, I gave orders to give IV fluid and then  follow up this morning.  At this time, the patient is sedated with Phenergan  and I am unable to obtain a full history.   REVIEW OF SYSTEMS:  Unable to be obtained secondary to sedation with  Phenergan.   PAST MEDICAL HISTORY:  1. End-stage degenerative joint disease of the left knee.  2. Hypothyroidism.  3. Degenerative disk disease of the lumbosacral spine.  4. Excision of vaginal polyps in 1980.   OUTPATIENT MEDICATIONS:  1. Darvocet p.r.n.  2. Synthroid 0.125 mg p.o. daily.  3. Nexium  p.r.n.   ALLERGIES:  Chart reports allergies to:  1. BEXTRA.  2. VICODIN.  3. CODEINE.  4. TALWIN.   FAMILY HISTORY:  Noncontributory to this consultation.   SOCIAL HISTORY:  Occasional alcohol use with remote tobacco abuse that was  stopped in 1982.   PHYSICAL EXAMINATION:  VITAL SIGNS:  Temperature 99.9, blood pressure  139/49, heart rate 96, respiratory rate 18, O2 saturation 95% on room air.  GENERAL:  Well-developed, well-nourished female in no acute respiratory  distress resting comfortably in hospital bed.  LUNGS:  Clear to auscultation bilaterally without wheeze or rhonchi.  CARDIOVASCULAR:  Regular  rate and rhythm without murmurs, gallops or rub.  Normal S1, S2.  No significant jugular venous distention.  NECK:  No thyromegaly.  No appreciable lymphadenopathy.  ABDOMEN:  Mildly obese.  Bowel sounds positive.  Soft.  Nondistended.  EXTREMITIES:  No significant cyanosis, clubbing or edema appreciable at this  time with no appreciable calf erythema or enlargement.  NEUROLOGIC:  Cranial nerves II-XII grossly intact are intact bilaterally on  passive exam, but full exam is unable to be accomplished because of  significant sedation.   DATA REVIEW:  BUN 6, creatinine 0.8,  potassium 3.9, sodium 135, hemoglobin  10.4 at this time, but was 15.3 prior to admission with an MCV of 91. TSH is  low at 0.26.  Urinalysis is unremarkable.  Chest x-ray reveals no acute  distress.  PTT is 37.  INR is 1.2.   IMPRESSION:  1. Hypotension.  Per review of the old records, the patient has a history of     hypotension as a response to anesthesia of any kind with prior surgeries.     The episode that she experienced last night does not appear to have been     severe.  Blood pressures never dropped below the 90s and there was no     significant tachycardia with this.  With simple hydration, the patient's     blood pressure has returned to a normal level today with systolics in the     694-854  range.  At this time,  I favor decreasing her IV fluid back to a     maintenance of approximately 100 mL an hour.  We will continue to follow     her blood pressure, but I doubt that any further intervention will be     needed.  2. Hypothyroidism.  The patient is on Synthroid at the above listed dose.     TSH is markedly depressed at 0.26.  Target range for this patient would     be approximately 1.0.  She quite likely will need decrease of her     Synthroid, but under the period of acute stress such as having undergone     orthopedic surgery I would hesitate to decrease her dose at this point.     I would recommend recheck of TSH in approximately four weeks.  I will     explain this to her when she is more alert.  3. Postoperative anemia.  The patient presented with hemoglobin of 15.3.  In     the postoperative period rechecks have dropped to 10.4. MCV is 91.  I     feel that this likely represents a combination of IV fluid resuscitation     causing delusion as well as intraoperative loss.  We will simply follow     CBC.  There is no indication for transfusion unless the patient's     hemoglobin should drop below 8.0.  4. Prophylaxis.  I would continue DVT prophylaxis as you are.  There are no     clinical signs or symptoms at this time that would suggest that the     patient's hypotension was related to a massive pulmonary embolism.  At     this time, I do not feel that rule out of pulmonary embolism is required.     We will watch saturations and vital signs and if the patient should     experience further hypotension then scanning for pulmonary embolism would     be considered.   Thank you for your consultation on this pleasant patient.  I will be happy  to follow along with you.  Cherene Altes, M.D.    JTM/MEDQ  D:  09/09/2003  T:  09/09/2003  Job:  [id number]

## 2011-03-01 ENCOUNTER — Other Ambulatory Visit: Payer: Self-pay | Admitting: Dermatology

## 2011-04-13 ENCOUNTER — Other Ambulatory Visit: Payer: Self-pay | Admitting: Internal Medicine

## 2011-04-13 MED ORDER — DIPHENOXYLATE-ATROPINE 2.5-0.025 MG PO TABS: 1.0000 | ORAL_TABLET | Freq: Three times a day (TID) | ORAL | Status: DC | PRN

## 2011-04-13 NOTE — Telephone Encounter (Signed)
Can patient have refill of her Lomotil and she wants quantity increased.  Please advise, thanks.

## 2011-04-13 NOTE — Telephone Encounter (Signed)
Rx printed and faxed to pharmacy. 

## 2011-04-13 NOTE — Telephone Encounter (Signed)
Okay to refill? 

## 2011-04-16 ENCOUNTER — Telehealth: Payer: Self-pay | Admitting: Internal Medicine

## 2011-05-29 ENCOUNTER — Other Ambulatory Visit: Payer: Self-pay | Admitting: Internal Medicine

## 2011-05-29 NOTE — Telephone Encounter (Signed)
Left message for pt to call back  °

## 2011-05-30 NOTE — Telephone Encounter (Signed)
Lomotil prescription refilled X 2, called to [facility].

## 2011-06-19 ENCOUNTER — Ambulatory Visit (INDEPENDENT_AMBULATORY_CARE_PROVIDER_SITE_OTHER): Payer: Medicare Other | Admitting: Internal Medicine

## 2011-06-19 ENCOUNTER — Ambulatory Visit (INDEPENDENT_AMBULATORY_CARE_PROVIDER_SITE_OTHER): Payer: Medicare Other | Admitting: Emergency Medicine

## 2011-06-19 ENCOUNTER — Encounter: Payer: Self-pay | Admitting: Internal Medicine

## 2011-06-19 ENCOUNTER — Ambulatory Visit: Payer: Medicare Other | Admitting: Internal Medicine

## 2011-06-19 ENCOUNTER — Encounter: Payer: Self-pay | Admitting: Emergency Medicine

## 2011-06-19 VITALS — BP 100/62 | HR 80 | Ht 65.0 in | Wt 184.6 lb

## 2011-06-19 DIAGNOSIS — J449 Chronic obstructive pulmonary disease, unspecified: Secondary | ICD-10-CM

## 2011-06-19 DIAGNOSIS — K52832 Lymphocytic colitis: Secondary | ICD-10-CM

## 2011-06-19 DIAGNOSIS — R197 Diarrhea, unspecified: Secondary | ICD-10-CM

## 2011-06-19 DIAGNOSIS — K5289 Other specified noninfective gastroenteritis and colitis: Secondary | ICD-10-CM

## 2011-06-19 MED ORDER — DIPHENOXYLATE-ATROPINE 2.5-0.025 MG PO TABS: 1.0000 | ORAL_TABLET | Freq: Three times a day (TID) | ORAL | Status: DC | PRN

## 2011-06-19 NOTE — Patient Instructions (Signed)
Please continue your Spiriva daily Use albuterol 2 puffs as needed for shortness of breath Continue to gargle after you use the Spiriva Follow up with Dr Lamonte Sakai in 6 months

## 2011-06-19 NOTE — Patient Instructions (Signed)
Refill Lomotil/Rx. Printed and given to you to take to your pharmacy.

## 2011-06-19 NOTE — Assessment & Plan Note (Addendum)
-   continue spiriva and continue gargling after taking - SABA prn, change xopenex to albuterol - rov 6 months

## 2011-06-19 NOTE — Progress Notes (Signed)
HISTORY OF PRESENT ILLNESS:  [name] is a 77 y.o. female with multiple medical problems as listed below who presents today for followup. She is followed in this office for biopsy proven lymphocytic colitis diagnosed in April of 2011. She was last evaluated 02/15/2011 regarding management of the same. See that dictation. At that time, she also complained of hoarseness with question of GERD. Since that visit, she has successfully tapered off prednisone. She was on omeprazole transiently but has discontinued. Her hoarseness has resolved. Currently, she is taking 5-6 Lomotil daily with good control bowel habits. She is pleased. She reports 2-3 soft bowel movements daily. No abdominal pain, bleeding, or other issues. No appreciable medication side effect. She requests a 90 day refill of her medication.  REVIEW OF SYSTEMS:  All non-GI ROS negative.  Past Medical History  Diagnosis Date  . Hypothyroidism   . Psoriatic arthritis   . IBS (irritable bowel syndrome)   . Hemorrhoids   . Diverticulosis   . Rectal polyp   . GERD (gastroesophageal reflux disease)   . Family hx of colon cancer   . Uterine polyp   . Arthritis   . COPD (chronic obstructive pulmonary disease)   . Lymphocytic colitis     Past Surgical History  Procedure Date  . Knee surgery     lt.  Marland Kitchen Uterine polypectomy   . Cholecystectomy Dec 04, 2010    Social History [name]  reports that she quit smoking about 30 years ago. Her smoking use included Cigarettes. She has a 20 pack-year smoking history. She has never used smokeless tobacco. She reports that she drinks alcohol. She reports that she does not use illicit drugs.  family history includes Colon cancer in her mother; Heart disease in her father; and Lung cancer in her father.  Allergies  Allergen Reactions  . Codeine Itching       PHYSICAL EXAMINATION: Vital signs: BP 100/62  Pulse 80  Ht 5\' 5"  (1.651 m)  Wt 184 lb 9.6 oz (83.734 kg)  BMI  30.72 kg/m2 General: Well-developed, well-nourished, no acute distress HEENT: Sclerae are anicteric, conjunctiva pink. Oral mucosa intact Lungs: Clear Heart: Regular Abdomen: soft, nontender, nondistended, no obvious ascites, no peritoneal signs, normal bowel sounds. No organomegaly. Extremities: No edema Psychiatric: alert and oriented x3. Cooperative    ASSESSMENT:  #1. Lymphocytic colitis. Currently managed with Lomotil as a solo agent   PLAN:  #1. Continue with Lomotil as needed to manage diarrhea #2. Routine GI followup in one year. Sooner for questions or problems.

## 2011-06-19 NOTE — Progress Notes (Signed)
  Subjective:    Patient ID: [name], female    DOB: 15-Jun-1933, 77 y.o.   MRN: [id number]  HPI 77 yo former smoker (20 pk-yrs), hx of lymphocytic colitis/IBS on prednisone, hypothyroidism, psoriatic arthritis formerly on MTX, OA s/p knee replacement. Presents for evaluation of slowly progressive dyspnea. Bothers her with walking, climbing stairs. Also mentions frequent bouts of bronchitis with apparent associated bronchospasm. Had an acute bronchitis in 2/11, treated with pred + abx + SABA (xopenex due to tachycardia with albuterol). Then started on Spiriva. Now better but still SOB with exertion.   ROV 01/31/10 -- returns for f/u and after PFTs. Feels that she has benefitted from the Spiriva, her exertional symptoms and nighttime symptoms are much improved. Has had PFTs as below.   ROV 08/09/10 -- f/u for COPD (moderate AFL on spiro w no BD response). She was on Spiriva and felt that it helped her breathing, but it bothered her UA - made it dry, hoarse, throat burning. No allergy symptoms. She has been taking the xopenex. C/o some soreness on tongue, ? thrush.   ROV 10/17/10 -- moderate COPD, had been off pred for IBS/colitis since our last visit, just restarted by Dr Henrene Pastor. She doesn't think her breathing worsened off the pred. Tried Symbicort but thout the Spiriva helped her breathing more. She didn't have any side effects from the Symbicort. She is interested in retrying the Spiriva to see if she can tolerate. No flares or exacerbations since last visit. Rare xopenex use.  ROV 02/15/11 -- returns for COPD. We have tried both Symbicort and Spiriva. Last time we went back on Spiriva  - she believes that it has helped, but she is having some hoarse voice. Believes that it is due to Spiriva + a worsening of her allergies. Started on zyrtec in March with some relief but still bothering her. Since last visit Prednisone has been restarted. Had emergency sgy for cholecystitis in February. Having GERD sx  at night when she lays down.   ROV 06/19/11 -- follow up for COPD. She is tolerating the spiriva. Her prednisone was weaned to off in June, hasn't been restarted, she has f/u w Dr Henrene Pastor today. She rarely uses xopenex. No flares since last visit. She does still have some exertional sob.    Review of Systems As above    Objective:   Physical Exam Gen: Pleasant, overwt, in no distress,  normal affect  ENT: No lesions,  mouth clear,  oropharynx clear, no postnasal drip  Neck: No JVD, no TMG, no carotid bruits  Lungs: No use of accessory muscles, no dullness to percussion, clear without rales or rhonchi  Cardiovascular: RRR, heart sounds normal, no murmur or gallops, no peripheral edema  Musculoskeletal: No deformities, no cyanosis or clubbing  Neuro: alert, non focal  Skin: Warm, no lesions or rashes   Assessment & Plan:  COPD - continue spiriva and continue gargling after taking - SABA prn, change xopenex to albuterol - rov 6 months

## 2011-06-29 ENCOUNTER — Other Ambulatory Visit (HOSPITAL_COMMUNITY): Payer: Self-pay | Admitting: Internal Medicine

## 2011-06-29 DIAGNOSIS — Z139 Encounter for screening, unspecified: Secondary | ICD-10-CM

## 2011-07-02 ENCOUNTER — Ambulatory Visit (HOSPITAL_COMMUNITY)
Admission: RE | Admit: 2011-07-02 | Discharge: 2011-07-02 | Disposition: A | Discharge status: Home / Self Care | Payer: Medicare Other | Source: Ambulatory Visit | Attending: Internal Medicine | Admitting: Internal Medicine

## 2011-07-02 DIAGNOSIS — Z1231 Encounter for screening mammogram for malignant neoplasm of breast: Secondary | ICD-10-CM | POA: Insufficient documentation

## 2011-07-02 DIAGNOSIS — Z139 Encounter for screening, unspecified: Secondary | ICD-10-CM

## 2011-08-13 NOTE — Telephone Encounter (Signed)
done

## 2011-10-09 DIAGNOSIS — K222 Esophageal obstruction: Secondary | ICD-10-CM

## 2011-10-09 HISTORY — DX: Esophageal obstruction: K22.2

## 2011-12-18 ENCOUNTER — Ambulatory Visit (INDEPENDENT_AMBULATORY_CARE_PROVIDER_SITE_OTHER): Payer: Medicare Other | Admitting: Emergency Medicine

## 2011-12-18 ENCOUNTER — Encounter: Payer: Self-pay | Admitting: Emergency Medicine

## 2011-12-18 DIAGNOSIS — J302 Other seasonal allergic rhinitis: Secondary | ICD-10-CM

## 2011-12-18 DIAGNOSIS — J309 Allergic rhinitis, unspecified: Secondary | ICD-10-CM

## 2011-12-18 DIAGNOSIS — J449 Chronic obstructive pulmonary disease, unspecified: Secondary | ICD-10-CM

## 2011-12-18 MED ORDER — TIOTROPIUM BROMIDE MONOHYDRATE 18 MCG IN CAPS: 18.0000 ug | ORAL_CAPSULE | Freq: Every day | RESPIRATORY_TRACT | Status: DC

## 2011-12-18 NOTE — Assessment & Plan Note (Signed)
Try decongestants that contain chlorpheniramine or brompheniramine

## 2011-12-18 NOTE — Progress Notes (Signed)
  Subjective:    Patient ID: [name], female    DOB: 09/04/1933, 78 y.o.   MRN: [id number]  HPI 78 yo former smoker (20 pk-yrs), hx of lymphocytic colitis/IBS on prednisone, hypothyroidism, psoriatic arthritis formerly on MTX, OA s/p knee replacement. Presents for evaluation of slowly progressive dyspnea. Bothers her with walking, climbing stairs. Also mentions frequent bouts of bronchitis with apparent associated bronchospasm. Had an acute bronchitis in 2/11, treated with pred + abx + SABA (xopenex due to tachycardia with albuterol). Then started on Spiriva. Now better but still SOB with exertion.   ROV 01/31/10 -- returns for f/u and after PFTs. Feels that she has benefitted from the Spiriva, her exertional symptoms and nighttime symptoms are much improved. Has had PFTs as below.   ROV 08/09/10 -- f/u for COPD (moderate AFL on spiro w no BD response). She was on Spiriva and felt that it helped her breathing, but it bothered her UA - made it dry, hoarse, throat burning. No allergy symptoms. She has been taking the xopenex. C/o some soreness on tongue, ? thrush.   ROV 10/17/10 -- moderate COPD, had been off pred for IBS/colitis since our last visit, just restarted by Dr Henrene Pastor. She doesn't think her breathing worsened off the pred. Tried Symbicort but thout the Spiriva helped her breathing more. She didn't have any side effects from the Symbicort. She is interested in retrying the Spiriva to see if she can tolerate. No flares or exacerbations since last visit. Rare xopenex use.  ROV 02/15/11 -- returns for COPD. We have tried both Symbicort and Spiriva. Last time we went back on Spiriva  - she believes that it has helped, but she is having some hoarse voice. Believes that it is due to Spiriva + a worsening of her allergies. Started on zyrtec in March with some relief but still bothering her. Since last visit Prednisone has been restarted. Had emergency sgy for cholecystitis in February. Having GERD sx  at night when she lays down.   ROV 06/19/11 -- follow up for COPD. She is tolerating the spiriva. Her prednisone was weaned to off in June, hasn't been restarted, she has f/u w Dr Henrene Pastor today. She rarely uses xopenex. No flares since last visit. She does still have some exertional sob.   ROV 12/18/11 -- COPD, also w hx of IBS/colitis followed by Dr Henrene Pastor. Currently on Spiriva, has xopenex to use prn - uses rarely. Still gets SOB with walking or with stairs. She thinks Spiriva helps but it also can make her throat hoarse. Not on anti-inflammatory meds at this time.    Review of Systems As above    Objective:   Physical Exam Filed Vitals:   12/18/11 1408  BP: 114/72  Pulse: 97  Temp: 98 F (36.7 C)    Gen: Pleasant, overwt, in no distress,  normal affect  ENT: No lesions,  mouth clear,  oropharynx clear, no postnasal drip  Neck: No JVD, no TMG, no carotid bruits  Lungs: No use of accessory muscles, no dullness to percussion, clear without rales or rhonchi  Cardiovascular: RRR, heart sounds normal, no murmur or gallops, no peripheral edema  Musculoskeletal: No deformities, no cyanosis or clubbing  Neuro: alert, non focal  Skin: Warm, no lesions or rashes   Assessment & Plan:  Allergic rhinitis, seasonal Try decongestants that contain chlorpheniramine or brompheniramine  COPD Spiriva + SABA prn

## 2011-12-18 NOTE — Patient Instructions (Signed)
Please continue your Spiriva every day - we will give you a 3 month prescription to mail to the pharmacy Use xopenex 2 puffs if needed for shortness of breath Try using decongestants that contain either brompheniramine or chlorpheniramine.  Follow with Dr Lamonte Sakai in 6 months

## 2011-12-18 NOTE — Assessment & Plan Note (Signed)
Spiriva + SABA prn  

## 2012-02-03 ENCOUNTER — Other Ambulatory Visit: Payer: Self-pay | Admitting: Orthopedic Surgery

## 2012-02-03 MED ORDER — DEXAMETHASONE SODIUM PHOSPHATE 10 MG/ML IJ SOLN: 10.0000 mg | Freq: Once | INTRAMUSCULAR | Status: DC

## 2012-02-03 MED ORDER — BUPIVACAINE 0.25 % ON-Q PUMP SINGLE CATH 300ML: 300.0000 mL | INJECTION | Status: DC

## 2012-02-13 ENCOUNTER — Telehealth: Payer: Self-pay | Admitting: Internal Medicine

## 2012-02-13 NOTE — Telephone Encounter (Signed)
Pt states she has been having problems with pain in her stomach in the center right below her belly button. Pt states she wants to be seen sooner than her appt with Dr. Henrene Pastor. Pt scheduled to see Tye Savoy NP tomorrow at 3pm. Pt aware of appt date and time.

## 2012-02-14 ENCOUNTER — Encounter: Payer: Self-pay | Admitting: Nurse Practitioner

## 2012-02-14 ENCOUNTER — Ambulatory Visit (INDEPENDENT_AMBULATORY_CARE_PROVIDER_SITE_OTHER)
Admission: RE | Admit: 2012-02-14 | Discharge: 2012-02-14 | Disposition: A | Discharge status: Home / Self Care | Payer: Medicare Other | Source: Ambulatory Visit | Attending: Nurse Practitioner | Admitting: Nurse Practitioner

## 2012-02-14 ENCOUNTER — Other Ambulatory Visit (INDEPENDENT_AMBULATORY_CARE_PROVIDER_SITE_OTHER): Payer: Medicare Other

## 2012-02-14 ENCOUNTER — Ambulatory Visit (INDEPENDENT_AMBULATORY_CARE_PROVIDER_SITE_OTHER): Payer: Medicare Other | Admitting: Nurse Practitioner

## 2012-02-14 VITALS — BP 128/64 | HR 80 | Ht 65.0 in | Wt 175.0 lb

## 2012-02-14 DIAGNOSIS — R109 Unspecified abdominal pain: Secondary | ICD-10-CM

## 2012-02-14 DIAGNOSIS — R143 Flatulence: Secondary | ICD-10-CM

## 2012-02-14 DIAGNOSIS — R1010 Upper abdominal pain, unspecified: Secondary | ICD-10-CM

## 2012-02-14 DIAGNOSIS — R140 Abdominal distension (gaseous): Secondary | ICD-10-CM

## 2012-02-14 DIAGNOSIS — R141 Gas pain: Secondary | ICD-10-CM

## 2012-02-14 LAB — COMPREHENSIVE METABOLIC PANEL
ALT: 16 U/L (ref 0–35)
AST: 19 U/L (ref 0–37)
Albumin: 3.8 g/dL (ref 3.5–5.2)
CO2: 31 mEq/L (ref 19–32)
Calcium: 9 mg/dL (ref 8.4–10.5)
Chloride: 102 mEq/L (ref 96–112)
GFR: 72.91 mL/min (ref >60.00)
Potassium: 5 mEq/L (ref 3.5–5.1)
Sodium: 141 mEq/L (ref 135–145)
Total Protein: 7 g/dL (ref 6.0–8.3)

## 2012-02-14 LAB — CBC WITH DIFFERENTIAL/PLATELET
Basophils Absolute: 0.1 10*3/uL (ref 0.0–0.1)
Eosinophils Absolute: 0.2 10*3/uL (ref 0.0–0.7)
Hemoglobin: 14.9 g/dL (ref 12.0–15.0)
Lymphocytes Relative: 22.4 % (ref 12.0–46.0)
MCHC: 33.8 g/dL (ref 30.0–36.0)
Monocytes Relative: 10 % (ref 3.0–12.0)
Neutro Abs: 4.7 10*3/uL (ref 1.4–7.7)
Neutrophils Relative %: 63.4 % (ref 43.0–77.0)
RDW: 14.1 % (ref 11.5–14.6)

## 2012-02-14 MED ORDER — OMEPRAZOLE 20 MG PO CPDR: 20.0000 mg | DELAYED_RELEASE_CAPSULE | Freq: Two times a day (BID) | ORAL | Status: DC

## 2012-02-14 NOTE — Patient Instructions (Signed)
Please go to the basement level to have your labs drawn.  Also go to our Radiology X-Ray department, basement level for a Flat & Upright.  We have given you samples of Prilosec OTC 20 mg.  Take 1 capsule before breakfast and dinner.

## 2012-02-14 NOTE — Progress Notes (Signed)
  [name] [id number] Feb 07, 1933   HISTORY OR PRESENT ILLNESS :  [name] is a 78 y.o. female with multiple medical problems as listed below. She is followed by Dr. Henrene Pastor for history of GERD and also lymphocytic colitis diagnosed in April 2011. At the time of her last visit September 2012 she was doing well on Lomotil.   Patient presents today with a 2-3 week history of bloating and mid to upper abdominal pain, worse after meals. Pain doesn't radiate, she has no associated nausea. No NSAID use. Patient had a cholecystectomy Feb 2012 for acute cholecystitis.    Current Medications, Allergies, Past Medical History, Past Surgical History, Family History and Social History were reviewed in [employer] record.   PHYSICAL EXAMINATION : General:  Well developed  female in no acute distress Head: Normocephalic and atraumatic Eyes:  sclerae anicteric,conjunctive pink. Ears: Normal auditory acuity Neck: Supple, no masses.  Lungs: Clear throughout to auscultation Heart: Regular rate and rhythm; no murmurs heard Abdomen: Soft, nondistended, nontender. No masses or hepatomegaly noted. Normal bowel sounds Rectal: not done Musculoskeletal: Symmetrical with no gross deformities  Skin: No lesions on visible extremities Extremities: No edema or deformities noted Neurological: Oriented, grossly nonfocal Cervical Nodes:  No significant cervical adenopathy Psychological:  Alert and cooperative. Normal mood and affect  ASSESSMENT AND PLAN :  Two to three week history of abdominal bloating and mid to upper abdominal pain.  Her abdominal exam is benign. For further evaluation will obtain some CMET, CBC, lipase and some abdominal films. Will try a PPI. I will call her with test results. Depending on her clinical course she may need further imaging (CTscan) and / or EGD.

## 2012-02-15 ENCOUNTER — Encounter: Payer: Self-pay | Admitting: Nurse Practitioner

## 2012-02-15 NOTE — Progress Notes (Signed)
Agree with Ms. Guenther's assessment and plan. Gatha Mayer, MD, [facility] - please let her know that labs and x-ray were all ok. Ask her to call back with an update in 2 weeks - i.e. Is she better on the Prilosec as directed.  If worsening before then she should call back sooner

## 2012-02-25 NOTE — Progress Notes (Signed)
Great!

## 2012-03-06 ENCOUNTER — Other Ambulatory Visit: Payer: Self-pay

## 2012-03-12 ENCOUNTER — Ambulatory Visit (INDEPENDENT_AMBULATORY_CARE_PROVIDER_SITE_OTHER): Payer: Medicare Other | Admitting: Internal Medicine

## 2012-03-12 ENCOUNTER — Encounter: Payer: Self-pay | Admitting: Internal Medicine

## 2012-03-12 VITALS — BP 100/50 | HR 92 | Ht 65.0 in | Wt 175.5 lb

## 2012-03-12 DIAGNOSIS — R1084 Generalized abdominal pain: Secondary | ICD-10-CM

## 2012-03-12 DIAGNOSIS — K52832 Lymphocytic colitis: Secondary | ICD-10-CM

## 2012-03-12 DIAGNOSIS — K5289 Other specified noninfective gastroenteritis and colitis: Secondary | ICD-10-CM

## 2012-03-12 DIAGNOSIS — R143 Flatulence: Secondary | ICD-10-CM

## 2012-03-12 DIAGNOSIS — R140 Abdominal distension (gaseous): Secondary | ICD-10-CM

## 2012-03-12 DIAGNOSIS — Z86010 Personal history of colonic polyps: Secondary | ICD-10-CM

## 2012-03-12 NOTE — Patient Instructions (Signed)
You have been scheduled for an endoscopy with propofol. Please follow written instructions given to you at your visit today.

## 2012-03-12 NOTE — Progress Notes (Signed)
HISTORY OF PRESENT ILLNESS:  [name] is a 78 y.o. female with multiple medical problems who is followed in this office for GERD, colon cancer screening, and a history of microscopic colitis. She was seen by our nurse practitioner on 02/14/2012 regarding mid to upper abdominal discomfort and bloating. See that dictation. Abdominal films (reviewed) were unremarkable. As well, laboratories (reviewed) were normal including CBC, and comprehensive metabolic panel. She was placed on Prilosec samples which he took for a few weeks. She states that this seemed to help. She still has difficulties with abdominal "swelling" which is worse at night. As well intermittent nausea. No problems with belching, flatus, or heartburn. He reports that her appetite has been better recently. Bowel habits are without change. Rarely needing antidiarrheals. She is status post cholecystectomy in February 2012. Her abdominal discomfort is sometimes exacerbated by meals. Her last colonoscopy was performed in April 2011. Microscopic colitis diagnosed at that time. She is anticipating knee replacement surgery this summer.  REVIEW OF SYSTEMS:  All non-GI ROS negative except for arthritis and shortness of breath  Past Medical History  Diagnosis Date  . Hypothyroidism   . Psoriatic arthritis   . IBS (irritable bowel syndrome)   . Hemorrhoids   . Diverticulosis   . Rectal polyp   . GERD (gastroesophageal reflux disease)   . Family hx of colon cancer   . Uterine polyp   . Arthritis   . COPD (chronic obstructive pulmonary disease)   . Lymphocytic colitis     Past Surgical History  Procedure Date  . Knee surgery     lt.  Marland Kitchen Uterine polypectomy   . Cholecystectomy Dec 04, 2010    Social History [name]  reports that she quit smoking about 31 years ago. Her smoking use included Cigarettes. She has a 20 pack-year smoking history. She has never used smokeless tobacco. She reports that she drinks alcohol. She  reports that she does not use illicit drugs.  family history includes Colon cancer in her mother; Heart disease in her father; and Lung cancer in her father.  Allergies  Allergen Reactions  . Codeine Itching       PHYSICAL EXAMINATION: Vital signs: BP 100/50  Pulse 92  Ht 5' 5"  (1.651 m)  Wt 175 lb 8 oz (79.606 kg)  BMI 29.20 kg/m2 General: Well-developed, well-nourished, no acute distress HEENT: Sclerae are anicteric, conjunctiva pink. Oral mucosa intact Lungs: Clear Heart: Regular Abdomen: soft,obese, nontender, nondistended, no obvious ascites, no peritoneal signs, normal bowel sounds. No organomegaly. Extremities: No edema Psychiatric: alert and oriented x3. Cooperative      ASSESSMENT:  #1. Abdominal discomfort with bloating. Ongoing. Only Somewhat improved with PPI. Now off PPI. Needs further workup #2. History of adenomatous colon polyps. #3. Lymphocytic colitis. Doing well currently. She uses Lomotil when necessary     PLAN:  #1. Diagnostic upper endoscopy to evaluate pain/discomfort.The nature of the procedure, as well as the risks, benefits, and alternatives were carefully and thoroughly reviewed with the patient. Ample time for discussion and questions allowed. The patient understood, was satisfied, and agreed to proceed.  #2Faythe Ghee to use PPI on demand if hopeful and not otherwise indicated based on endoscopy results #3. Continue with Lomotil when necessary for her lymphocytic colitis #4. Surveillance colonoscopy around April 2016

## 2012-04-01 ENCOUNTER — Ambulatory Visit (AMBULATORY_SURGERY_CENTER): Payer: Medicare Other | Admitting: Internal Medicine

## 2012-04-01 ENCOUNTER — Encounter: Payer: Self-pay | Admitting: Internal Medicine

## 2012-04-01 VITALS — BP 112/60 | HR 73 | Temp 97.5°F | Resp 14 | Ht 65.0 in | Wt 175.0 lb

## 2012-04-01 DIAGNOSIS — K219 Gastro-esophageal reflux disease without esophagitis: Secondary | ICD-10-CM

## 2012-04-01 DIAGNOSIS — R1084 Generalized abdominal pain: Secondary | ICD-10-CM

## 2012-04-01 MED ORDER — SODIUM CHLORIDE 0.9 % IV SOLN: 500.0000 mL | INTRAVENOUS | Status: DC

## 2012-04-01 NOTE — Progress Notes (Signed)
Patient did not experience any of the following events: a burn prior to discharge; a fall within the facility; wrong site/side/patient/procedure/implant event; or a hospital transfer or hospital admission upon discharge from the facility. (G8907) Patient did not have preoperative order for IV antibiotic SSI prophylaxis. (G8918)  

## 2012-04-01 NOTE — Op Note (Signed)
[facility]. [place], [place]  [id number]  ENDOSCOPY PROCEDURE REPORT  PATIENT:  [name], [name]  MR#:  [id number] BIRTHDATE:  1933-06-28, 82 yrs. old  GENDER:  female  ENDOSCOPIST:  [name]. Geri Seminole, MD Referred by:  Office  PROCEDURE DATE:  04/01/2012 PROCEDURE:  EGD, diagnostic 86754 ASA CLASS:  Class II INDICATIONS:  abdominal pain  MEDICATIONS:   MAC sedation, administered by CRNA, propofol (Diprivan) 120 mg IV TOPICAL ANESTHETIC:  none  DESCRIPTION OF PROCEDURE:   After the risks benefits and alternatives of the procedure were thoroughly explained, informed consent was obtained.  The LB GIF-H180 [id number] endoscope was introduced through the mouth and advanced to the third portion of the duodenum, without limitations.  The instrument was slowly withdrawn as the mucosa was fully examined. <<PROCEDUREIMAGES>>  A large caliber Schatzki's ring was found in the distal esophagus. Otherwise normal esophagus.  The stomach was entered and closely examined. The antrum, angularis, and lesser curvature were well visualized, including a retroflexed view of the cardia and fundus. The stomach wall was normally distensable. The scope passed easily through the pylorus into the duodenum.  The duodenal bulb was normal in appearance, as was the postbulbar duodenum. Retroflexed views revealed a small hiatal hernia.    The scope was then withdrawn from the patient and the procedure completed.  COMPLICATIONS:  None  ENDOSCOPIC IMPRESSION: 1) Schatzki's ring in the distal esophagus 2) Otherwise normal esophagus 3) Normal stomach 4) Normal duodenum 5) A small hiatal hernia 6) GERD  RECOMMENDATIONS: 1) Anti-reflux regimen to be followed 2) OK TO USE PRILOSEC OTC 20 MG DAILY, ON DEMAND, AS NEEDED FOR GERD SYMPTOMS  ______________________________ [name]. Geri Seminole, MD  CC:  Asencion Noble, MD;  The Patient  n. eSIGNED:   [name]. Geri Seminole at 04/01/2012 12:08  PM  Raynelle Bring, [phone]

## 2012-04-01 NOTE — Patient Instructions (Signed)
YOU HAD AN ENDOSCOPIC PROCEDURE TODAY AT THE [facility]: Refer to the procedure report that was given to you for any specific questions about what was found during the examination.  If the procedure report does not answer your questions, please call your gastroenterologist to clarify.  If you requested that your care partner not be given the details of your procedure findings, then the procedure report has been included in a sealed envelope for you to review at your convenience later.  YOU SHOULD EXPECT: Some feelings of bloating in the abdomen. Passage of more gas than usual.  Walking can help get rid of the air that was put into your GI tract during the procedure and reduce the bloating. If you had a lower endoscopy (such as a colonoscopy or flexible sigmoidoscopy) you may notice spotting of blood in your stool or on the toilet paper. If you underwent a bowel prep for your procedure, then you may not have a normal bowel movement for a few days.  DIET: Your first meal following the procedure should be a light meal and then it is ok to progress to your normal diet.  A half-sandwich or bowl of soup is an example of a good first meal.  Heavy or fried foods are harder to digest and may make you feel nauseous or bloated.  Likewise meals heavy in dairy and vegetables can cause extra gas to form and this can also increase the bloating.  Drink plenty of fluids but you should avoid alcoholic beverages for 24 hours.  ACTIVITY: Your care partner should take you home directly after the procedure.  You should plan to take it easy, moving slowly for the rest of the day.  You can resume normal activity the day after the procedure however you should NOT DRIVE or use heavy machinery for 24 hours (because of the sedation medicines used during the test).    SYMPTOMS TO REPORT IMMEDIATELY: A gastroenterologist can be reached at any hour.  During normal business hours, 8:30 AM to 5:00 PM Monday through Friday,  call [phone].  After hours and on weekends, please call the GI answering service at [phone] who will take a message and have the physician on call contact you.   Following lower endoscopy (colonoscopy or flexible sigmoidoscopy):  Excessive amounts of blood in the stool  Significant tenderness or worsening of abdominal pains  Swelling of the abdomen that is new, acute  Fever of 100F or higher  Following upper endoscopy (EGD)  Vomiting of blood or coffee ground material  New chest pain or pain under the shoulder blades  Painful or persistently difficult swallowing  New shortness of breath  Fever of 100F or higher  Black, tarry-looking stools  FOLLOW UP: If any biopsies were taken you will be contacted by phone or by letter within the next 1-3 weeks.  Call your gastroenterologist if you have not heard about the biopsies in 3 weeks.  Our staff will call the home number listed on your records the next business day following your procedure to check on you and address any questions or concerns that you may have at that time regarding the information given to you following your procedure. This is a courtesy call and so if there is no answer at the home number and we have not heard from you through the emergency physician on call, we will assume that you have returned to your regular daily activities without incident.  SIGNATURES/CONFIDENTIALITY: You and/or your care   partner have signed paperwork which will be entered into your electronic medical record.  These signatures attest to the fact that that the information above on your After Visit Summary has been reviewed and is understood.  Full responsibility of the confidentiality of this discharge information lies with you and/or your care-partner.  

## 2012-04-02 ENCOUNTER — Telehealth: Payer: Self-pay

## 2012-04-02 NOTE — Telephone Encounter (Signed)
  Follow up Call-  Call back number 04/01/2012  Post procedure Call Back phone  # [phone]  Permission to leave phone message Yes     Patient questions:  Do you have a fever, pain , or abdominal swelling? no Pain Score  0 *  Have you tolerated food without any problems? yes  Have you been able to return to your normal activities? yes  Do you have any questions about your discharge instructions: Diet   no Medications  no Follow up visit  no  Do you have questions or concerns about your Care? no  Actions: * If pain score is 4 or above: No action needed, pain <4.

## 2012-05-05 ENCOUNTER — Other Ambulatory Visit (HOSPITAL_COMMUNITY): Payer: Medicare Other

## 2012-06-17 ENCOUNTER — Ambulatory Visit (INDEPENDENT_AMBULATORY_CARE_PROVIDER_SITE_OTHER): Payer: Medicare Other | Admitting: Emergency Medicine

## 2012-06-17 ENCOUNTER — Encounter: Payer: Self-pay | Admitting: Emergency Medicine

## 2012-06-17 VITALS — BP 116/66 | HR 100 | Ht 65.0 in | Wt 178.4 lb

## 2012-06-17 DIAGNOSIS — J302 Other seasonal allergic rhinitis: Secondary | ICD-10-CM

## 2012-06-17 DIAGNOSIS — J449 Chronic obstructive pulmonary disease, unspecified: Secondary | ICD-10-CM

## 2012-06-17 DIAGNOSIS — J4489 Other specified chronic obstructive pulmonary disease: Secondary | ICD-10-CM

## 2012-06-17 DIAGNOSIS — J309 Allergic rhinitis, unspecified: Secondary | ICD-10-CM

## 2012-06-17 MED ORDER — CETIRIZINE HCL 10 MG PO TABS: 10.0000 mg | ORAL_TABLET | Freq: Every day | ORAL | Status: DC

## 2012-06-17 NOTE — Assessment & Plan Note (Signed)
Restart zyrtec Trial qnasl and if effective call in either qnasl or fluticasone chlorpheniramine or brompheniramine

## 2012-06-17 NOTE — Progress Notes (Signed)
Subjective:    Patient ID: [name], female    DOB: 12-19-1932, 78 y.o.   MRN: [id number]  HPI 78 yo former smoker (20 pk-yrs), hx of lymphocytic colitis/IBS on prednisone, hypothyroidism, psoriatic arthritis formerly on MTX, OA s/p knee replacement. Presents for evaluation of slowly progressive dyspnea. Bothers her with walking, climbing stairs. Also mentions frequent bouts of bronchitis with apparent associated bronchospasm. Had an acute bronchitis in 2/11, treated with pred + abx + SABA (xopenex due to tachycardia with albuterol). Then started on Spiriva. Now better but still SOB with exertion.   ROV 01/31/10 -- returns for f/u and after PFTs. Feels that she has benefitted from the Spiriva, her exertional symptoms and nighttime symptoms are much improved. Has had PFTs as below.   ROV 08/09/10 -- f/u for COPD (moderate AFL on spiro w no BD response). She was on Spiriva and felt that it helped her breathing, but it bothered her UA - made it dry, hoarse, throat burning. No allergy symptoms. She has been taking the xopenex. C/o some soreness on tongue, ? thrush.   ROV 10/17/10 -- moderate COPD, had been off pred for IBS/colitis since our last visit, just restarted by Dr Henrene Pastor. She doesn't think her breathing worsened off the pred. Tried Symbicort but thout the Spiriva helped her breathing more. She didn't have any side effects from the Symbicort. She is interested in retrying the Spiriva to see if she can tolerate. No flares or exacerbations since last visit. Rare xopenex use.  ROV 02/15/11 -- returns for COPD. We have tried both Symbicort and Spiriva. Last time we went back on Spiriva  - she believes that it has helped, but she is having some hoarse voice. Believes that it is due to Spiriva + a worsening of her allergies. Started on zyrtec in March with some relief but still bothering her. Since last visit Prednisone has been restarted. Had emergency sgy for cholecystitis in February. Having GERD sx  at night when she lays down.   ROV 06/19/11 -- follow up for COPD. She is tolerating the spiriva. Her prednisone was weaned to off in June, hasn't been restarted, she has f/u w Dr Henrene Pastor today. She rarely uses xopenex. No flares since last visit. She does still have some exertional sob.   ROV 12/18/11 -- COPD, also w hx of IBS/colitis followed by Dr Henrene Pastor. Currently on Spiriva, has xopenex to use prn - uses rarely. Still gets SOB with walking or with stairs. She thinks Spiriva helps but it also can make her throat hoarse. Not on anti-inflammatory meds at this time.   ROV 06/17/12 -- COPD, also w hx of IBS/colitis followed by Dr Henrene Pastor. She is using Spiriva. She has had more trouble with allergies and associated cough over the last several months. Not currently on zyrtec. She did benefit from chlorpheniramine. Rare SABA use.    Review of Systems As above    Objective:   Physical Exam Filed Vitals:   06/17/12 1343  BP: 116/66  Pulse: 100   Gen: Pleasant, overwt, in no distress,  normal affect  ENT: No lesions,  mouth clear,  oropharynx clear, no postnasal drip  Neck: No JVD, no TMG, no carotid bruits  Lungs: No use of accessory muscles, no dullness to percussion, clear without rales or rhonchi  Cardiovascular: RRR, heart sounds normal, no murmur or gallops, no peripheral edema  Musculoskeletal: No deformities, no cyanosis or clubbing  Neuro: alert, non focal  Skin: Warm, no lesions or  rashes   Assessment & Plan:  COPD Spiriva + xopenex prn Flu shot   Allergic rhinitis, seasonal Restart zyrtec Trial qnasl and if effective call in either qnasl or fluticasone chlorpheniramine or brompheniramine

## 2012-06-17 NOTE — Patient Instructions (Addendum)
Continue your Spiriva Use xopenex 2 puffs as needed for shortness of breath Restart your zyrtec daily Start using Qnasl 2 sprays each nostril daily. If this sample helps you we can call the medication (or a generic version of this medication) in to your pharmacy Use chlorpheniramine or brompheniramine (over-the-counter) as needed for your nasal drainage.  Follow with Dr Lamonte Sakai in 6 months or sooner if you have any problems

## 2012-06-17 NOTE — Assessment & Plan Note (Signed)
Spiriva + xopenex prn Flu shot

## 2012-06-23 ENCOUNTER — Other Ambulatory Visit (HOSPITAL_COMMUNITY): Payer: Self-pay | Admitting: Internal Medicine

## 2012-06-23 DIAGNOSIS — Z139 Encounter for screening, unspecified: Secondary | ICD-10-CM

## 2012-07-03 ENCOUNTER — Ambulatory Visit (HOSPITAL_COMMUNITY)
Admission: RE | Admit: 2012-07-03 | Discharge: 2012-07-03 | Disposition: A | Discharge status: Home / Self Care | Payer: Medicare Other | Source: Ambulatory Visit | Attending: Internal Medicine | Admitting: Internal Medicine

## 2012-07-03 DIAGNOSIS — Z1231 Encounter for screening mammogram for malignant neoplasm of breast: Secondary | ICD-10-CM | POA: Insufficient documentation

## 2012-07-03 DIAGNOSIS — Z139 Encounter for screening, unspecified: Secondary | ICD-10-CM

## 2012-07-09 ENCOUNTER — Other Ambulatory Visit: Payer: Self-pay | Admitting: Orthopedic Surgery

## 2012-07-09 MED ORDER — DEXAMETHASONE SODIUM PHOSPHATE 10 MG/ML IJ SOLN: 10.0000 mg | Freq: Once | INTRAMUSCULAR | Status: DC

## 2012-07-09 MED ORDER — BUPIVACAINE 0.25 % ON-Q PUMP SINGLE CATH 300ML: 300.0000 mL | INJECTION | Status: DC

## 2012-07-09 NOTE — Progress Notes (Signed)
Preoperative surgical orders have been place into the [facility] hospital system for [name] on 07/09/2012, 4:20 PM  by Mickel Crow for surgery on 08/06/12.  Preop Total Knee orders including Bupivacaine On-Q pump, IV Tylenol, and IV Decadron as long as there are no contraindications to the above medications. Arlee Muslim, PA-C

## 2012-07-10 ENCOUNTER — Telehealth: Payer: Self-pay | Admitting: Emergency Medicine

## 2012-07-10 NOTE — Telephone Encounter (Signed)
[email or web address].Samuella Rasool, CMA  

## 2012-07-10 NOTE — Telephone Encounter (Signed)
Pt c/o runny nose and sinus drainage that is clear and thinks this is due to Spiriva. She said she read up on side effects and these things were listed. She takes zyrteC AND saline nasal spray at times. She does not feel bad and does not cough very much. She is asking for recs. RB is off for a few days so we will send to our doc of the day, Dr. Maple Hudson. Pls advise. Allergies  Allergen Reactions  . Codeine Itching

## 2012-07-10 NOTE — Telephone Encounter (Signed)
Pt returned call. Kathleen W Perdue  

## 2012-07-10 NOTE — Telephone Encounter (Signed)
[email or web address] x1 

## 2012-07-10 NOTE — Telephone Encounter (Signed)
Per CY-doubt this is Spiriva which usually drying. But okay to leave it off over the weekend to see what happens.

## 2012-07-11 NOTE — Telephone Encounter (Signed)
Pt is aware of Dr. Vicenta Dunning and will stop the Spiriva over the weekend to see if her sxs improve. She will call back next week to give an update.

## 2012-07-11 NOTE — Telephone Encounter (Signed)
Pt returned call. [name]  

## 2012-07-25 ENCOUNTER — Encounter (HOSPITAL_COMMUNITY): Payer: Self-pay | Admitting: Pharmacy Technician

## 2012-07-28 ENCOUNTER — Ambulatory Visit (INDEPENDENT_AMBULATORY_CARE_PROVIDER_SITE_OTHER): Payer: Medicare Other | Admitting: Adult Health

## 2012-07-28 ENCOUNTER — Encounter: Payer: Self-pay | Admitting: Adult Health

## 2012-07-28 VITALS — BP 116/64 | HR 79 | Temp 97.2°F | Ht 65.0 in | Wt 182.0 lb

## 2012-07-28 DIAGNOSIS — J449 Chronic obstructive pulmonary disease, unspecified: Secondary | ICD-10-CM

## 2012-07-28 MED ORDER — CEFDINIR 300 MG PO CAPS: 300.0000 mg | ORAL_CAPSULE | Freq: Two times a day (BID) | ORAL | Status: DC

## 2012-07-28 MED ORDER — LEVALBUTEROL HCL 0.63 MG/3ML IN NEBU
0.6300 mg | INHALATION_SOLUTION | Freq: Once | RESPIRATORY_TRACT | Status: AC
  Administered 2012-07-28: 0.63 mg via RESPIRATORY_TRACT

## 2012-07-28 NOTE — Assessment & Plan Note (Signed)
Mild Flare with URI  xopenex neb in office   Plan Omnicef 300mg  Twice daily  For 7 days -take w/ food  Mucinex DM Twice daily  As needed  Cough/congestion  Fluids and rest  Saline nasal rinses As needed   Please contact office for sooner follow up if symptoms do not improve or worsen or seek emergency care

## 2012-07-28 NOTE — Addendum Note (Signed)
Addended by: Boone Master E on: 07/28/2012 05:33 PM   Modules accepted: Orders

## 2012-07-28 NOTE — Progress Notes (Signed)
Subjective:    Patient ID: [name], female    DOB: 02-22-1933, 78 y.o.   MRN: [id number]  HPI 78 yo former smoker (20 pk-yrs), hx of lymphocytic colitis/IBS on prednisone, hypothyroidism, psoriatic arthritis formerly on MTX, OA s/p knee replacement. Presents for evaluation of slowly progressive dyspnea. Bothers her with walking, climbing stairs. Also mentions frequent bouts of bronchitis with apparent associated bronchospasm. Had an acute bronchitis in 2/11, treated with pred + abx + SABA (xopenex due to tachycardia with albuterol). Then started on Spiriva. Now better but still SOB with exertion.   ROV 01/31/10 -- returns for f/u and after PFTs. Feels that she has benefitted from the Spiriva, her exertional symptoms and nighttime symptoms are much improved. Has had PFTs as below.   ROV 08/09/10 -- f/u for COPD (moderate AFL on spiro w no BD response). She was on Spiriva and felt that it helped her breathing, but it bothered her UA - made it dry, hoarse, throat burning. No allergy symptoms. She has been taking the xopenex. C/o some soreness on tongue, ? thrush.   ROV 10/17/10 -- moderate COPD, had been off pred for IBS/colitis since our last visit, just restarted by Dr Marina Goodell. She doesn't think her breathing worsened off the pred. Tried Symbicort but thout the Spiriva helped her breathing more. She didn't have any side effects from the Symbicort. She is interested in retrying the Spiriva to see if she can tolerate. No flares or exacerbations since last visit. Rare xopenex use.  ROV 02/15/11 -- returns for COPD. We have tried both Symbicort and Spiriva. Last time we went back on Spiriva  - she believes that it has helped, but she is having some hoarse voice. Believes that it is due to Spiriva + a worsening of her allergies. Started on zyrtec in March with some relief but still bothering her. Since last visit Prednisone has been restarted. Had emergency sgy for cholecystitis in February. Having GERD sx  at night when she lays down.   ROV 06/19/11 -- follow up for COPD. She is tolerating the spiriva. Her prednisone was weaned to off in June, hasn't been restarted, she has f/u w Dr Marina Goodell today. She rarely uses xopenex. No flares since last visit. She does still have some exertional sob.   ROV 12/18/11 -- COPD, also w hx of IBS/colitis followed by Dr Marina Goodell. Currently on Spiriva, has xopenex to use prn - uses rarely. Still gets SOB with walking or with stairs. She thinks Spiriva helps but it also can make her throat hoarse. Not on anti-inflammatory meds at this time.   ROV 06/17/12 -- COPD, also w hx of IBS/colitis followed by Dr Marina Goodell. She is using Spiriva. She has had more trouble with allergies and associated cough over the last several months. Not currently on zyrtec. She did benefit from chlorpheniramine. Rare SABA use.   07/28/2012 Acute OV  Complains of dry cough, chest congestion, increased SOB, wheezing, tightness in chest, head congestion w/ clear drainage, PND x4 days .  Has upcoming surgery next week - redo of knee replacement w/ ortho No hemoptysis or chest pain. No edema.  No recent abx use.     Review of Systems Constitutional:   No  weight loss, night sweats,  Fevers, chills,  +fatigue, or  lassitude.  HEENT:   No headaches,  Difficulty swallowing,  Tooth/dental problems, or  Sore throat,                No sneezing,  itching, ear ache,  +nasal congestion, post nasal drip,   CV:  No chest pain,  Orthopnea, PND, swelling in lower extremities, anasarca, dizziness, palpitations, syncope.   GI  No heartburn, indigestion, abdominal pain, nausea, vomiting, diarrhea, change in bowel habits, loss of appetite, bloody stools.   Resp:   No coughing up of blood.    No chest wall deformity  Skin: no rash or lesions.  GU: no dysuria, change in color of urine, no urgency or frequency.  No flank pain, no hematuria   MS:  No joint pain or swelling.  No decreased range of motion.  No back  pain.  Psych:  No change in mood or affect. No depression or anxiety.  No memory loss.        Objective:   Physical Exam  Gen: Pleasant, overwt, in no distress,  normal affect  ENT: No lesions,  mouth clear,  oropharynx clear, no postnasal drip  Neck: No JVD, no TMG, no carotid bruits  Lungs: No use of accessory muscles, no dullness to percussion, coarse BS   Cardiovascular: RRR, heart sounds normal, no murmur or gallops, no peripheral edema  Musculoskeletal: No deformities, no cyanosis or clubbing  Neuro: alert, non focal  Skin: Warm, no lesions or rashes   Assessment & Plan:

## 2012-07-28 NOTE — Patient Instructions (Addendum)
Omnicef 370m Twice daily  For 7 days -take w/ food  Mucinex DM Twice daily  As needed  Cough/congestion  Fluids and rest  Saline nasal rinses As needed   Please contact office for sooner follow up if symptoms do not improve or worsen or seek emergency care

## 2012-07-31 ENCOUNTER — Ambulatory Visit (HOSPITAL_COMMUNITY)
Admission: RE | Admit: 2012-07-31 | Discharge: 2012-07-31 | Disposition: A | Discharge status: Home / Self Care | Payer: Medicare Other | Source: Ambulatory Visit | Attending: Orthopedic Surgery | Admitting: Orthopedic Surgery

## 2012-07-31 ENCOUNTER — Encounter (HOSPITAL_COMMUNITY): Payer: Self-pay

## 2012-07-31 ENCOUNTER — Encounter (HOSPITAL_COMMUNITY)
Admission: RE | Admit: 2012-07-31 | Discharge: 2012-07-31 | Disposition: A | Discharge status: Home / Self Care | Payer: Medicare Other | Source: Ambulatory Visit | Attending: Orthopedic Surgery | Admitting: Orthopedic Surgery

## 2012-07-31 DIAGNOSIS — J4489 Other specified chronic obstructive pulmonary disease: Secondary | ICD-10-CM | POA: Insufficient documentation

## 2012-07-31 DIAGNOSIS — Z01818 Encounter for other preprocedural examination: Secondary | ICD-10-CM | POA: Insufficient documentation

## 2012-07-31 DIAGNOSIS — Z01812 Encounter for preprocedural laboratory examination: Secondary | ICD-10-CM | POA: Insufficient documentation

## 2012-07-31 DIAGNOSIS — J449 Chronic obstructive pulmonary disease, unspecified: Secondary | ICD-10-CM | POA: Insufficient documentation

## 2012-07-31 LAB — PROTIME-INR
INR: 0.98 (ref 0.00–1.49)
Prothrombin Time: 12.9 seconds (ref 11.6–15.2)

## 2012-07-31 LAB — COMPREHENSIVE METABOLIC PANEL
Alkaline Phosphatase: 81 U/L (ref 39–117)
BUN: 14 mg/dL (ref 6–23)
Chloride: 99 mEq/L (ref 96–112)
Creatinine, Ser: 0.86 mg/dL (ref 0.50–1.10)
GFR calc Af Amer: 74 mL/min — ABNORMAL LOW (ref >90)
Glucose, Bld: 115 mg/dL — ABNORMAL HIGH (ref 70–99)
Potassium: 5.5 mEq/L — ABNORMAL HIGH (ref 3.5–5.1)
Total Bilirubin: 0.6 mg/dL (ref 0.3–1.2)

## 2012-07-31 LAB — URINALYSIS, ROUTINE W REFLEX MICROSCOPIC
Bilirubin Urine: NEGATIVE
Ketones, ur: NEGATIVE mg/dL
Protein, ur: NEGATIVE mg/dL
Urobilinogen, UA: 0.2 mg/dL (ref 0.0–1.0)

## 2012-07-31 LAB — CBC
HCT: 45.1 % (ref 36.0–46.0)
Hemoglobin: 15.4 g/dL — ABNORMAL HIGH (ref 12.0–15.0)
MCHC: 34.1 g/dL (ref 30.0–36.0)
MCV: 90 fL (ref 78.0–100.0)
WBC: 7.9 10*3/uL (ref 4.0–10.5)

## 2012-07-31 LAB — URINE MICROSCOPIC-ADD ON

## 2012-07-31 LAB — APTT: aPTT: 30 seconds (ref 24–37)

## 2012-07-31 MED ORDER — CHLORHEXIDINE GLUCONATE 4 % EX LIQD: 60.0000 mL | Freq: Once | CUTANEOUS | Status: DC

## 2012-07-31 NOTE — Patient Instructions (Addendum)
[name]  07/31/2012                           YOUR PROCEDURE IS SCHEDULED ON:  08/06/12 AT 7:15 AM               PLEASE REPORT TO SHORT STAY CENTER AT : 5:15 AM               CALL THIS NUMBER IF ANY PROBLEMS THE DAY OF SURGERY :               [phone]                      REMEMBER:   Do not eat food or drink liquids AFTER MIDNIGHT    Take these medicines the morning of surgery with A SIP OF WATER:  CEFDINIR / LOMOTIL / LEVOTHYROXINE / OMEPRAZOLE / SPIRIVIA / XOPENEX INHALER IF NEEDED   Do not wear jewelry, make-up   Do not wear lotions, powders, or perfumes.   Do not shave legs or underarms 12 hrs. before surgery (men may shave face)  Do not bring valuables to the hospital.  Contacts, dentures or bridgework may not be worn into surgery.  Leave suitcase in the car. After surgery it may be brought to your room.  For patients admitted to the hospital more than one night, checkout time is 11:00                          The day of discharge.   Patients discharged the day of surgery will not be allowed to drive home                             If going home same day of surgery, must have someone stay with you first                           24 hrs at home and arrange for some one to drive you home from hospital.    Special Instructions:   Please read over the following fact sheets that you were given:               1. MRSA  INFORMATION                      2.  PREPARING FOR SURGERY SHEET               3. INCENTIVE SPIROMETER                                              X_____________________________________________________________________

## 2012-07-31 NOTE — Progress Notes (Signed)
Abnormal CMET and UA faxed to Dr. Lequita Halt 07/31/12

## 2012-08-04 ENCOUNTER — Encounter: Payer: Self-pay | Admitting: Adult Health

## 2012-08-04 ENCOUNTER — Ambulatory Visit (INDEPENDENT_AMBULATORY_CARE_PROVIDER_SITE_OTHER): Payer: Medicare Other | Admitting: Adult Health

## 2012-08-04 VITALS — BP 110/66 | HR 91 | Temp 97.5°F | Ht 65.0 in | Wt 180.6 lb

## 2012-08-04 DIAGNOSIS — J449 Chronic obstructive pulmonary disease, unspecified: Secondary | ICD-10-CM

## 2012-08-04 MED ORDER — CEFDINIR 300 MG PO CAPS: 300.0000 mg | ORAL_CAPSULE | Freq: Two times a day (BID) | ORAL | Status: DC

## 2012-08-04 MED ORDER — FLUCONAZOLE 150 MG PO TABS: 150.0000 mg | ORAL_TABLET | Freq: Once | ORAL | Status: DC

## 2012-08-04 NOTE — Progress Notes (Signed)
Subjective:    Patient ID: [name], female    DOB: [birth year], 78 y.o.   MRN: [id number]  HPI 78 yo former smoker (20 pk-yrs), hx of lymphocytic colitis/IBS on prednisone, hypothyroidism, psoriatic arthritis formerly on MTX, OA s/p knee replacement. Presents for evaluation of slowly progressive dyspnea. Bothers her with walking, climbing stairs. Also mentions frequent bouts of bronchitis with apparent associated bronchospasm. Had an acute bronchitis in 2/11, treated with pred + abx + SABA (xopenex due to tachycardia with albuterol). Then started on Spiriva. Now better but still SOB with exertion.   ROV 01/31/10 -- returns for f/u and after PFTs. Feels that she has benefitted from the Spiriva, her exertional symptoms and nighttime symptoms are much improved. Has had PFTs as below.   ROV 08/09/10 -- f/u for COPD (moderate AFL on spiro w no BD response). She was on Spiriva and felt that it helped her breathing, but it bothered her UA - made it dry, hoarse, throat burning. No allergy symptoms. She has been taking the xopenex. C/o some soreness on tongue, ? thrush.   ROV 10/17/10 -- moderate COPD, had been off pred for IBS/colitis since our last visit, just restarted by Dr Marina Goodell. She doesn't think her breathing worsened off the pred. Tried Symbicort but thout the Spiriva helped her breathing more. She didn't have any side effects from the Symbicort. She is interested in retrying the Spiriva to see if she can tolerate. No flares or exacerbations since last visit. Rare xopenex use.  ROV 02/15/11 -- returns for COPD. We have tried both Symbicort and Spiriva. Last time we went back on Spiriva  - she believes that it has helped, but she is having some hoarse voice. Believes that it is due to Spiriva + a worsening of her allergies. Started on zyrtec in March with some relief but still bothering her. Since last visit Prednisone has been restarted. Had emergency sgy for cholecystitis in February. Having GERD sx  at night when she lays down.   ROV 06/19/11 -- follow up for COPD. She is tolerating the spiriva. Her prednisone was weaned to off in June, hasn't been restarted, she has f/u w Dr Marina Goodell today. She rarely uses xopenex. No flares since last visit. She does still have some exertional sob.   ROV 12/18/11 -- COPD, also w hx of IBS/colitis followed by Dr Marina Goodell. Currently on Spiriva, has xopenex to use prn - uses rarely. Still gets SOB with walking or with stairs. She thinks Spiriva helps but it also can make her throat hoarse. Not on anti-inflammatory meds at this time.   ROV 06/17/12 -- COPD, also w hx of IBS/colitis followed by Dr Marina Goodell. She is using Spiriva. She has had more trouble with allergies and associated cough over the last several months. Not currently on zyrtec. She did benefit from chlorpheniramine. Rare SABA use.   07/28/2012 Acute OV  Complains of dry cough, chest congestion, increased SOB, wheezing, tightness in chest, head congestion w/ clear drainage, PND x4 days .  Has upcoming surgery next week - redo of knee replacement w/ ortho No hemoptysis or chest pain. No edema.  No recent abx use.  >>Omnicef x 7 d   08/04/2012 Acute OV  Patient returns for persistent cough and congestion. The patient was seen 1 week ago for bronchitis and treated with 7 days of Omnicef. She has upcoming surgery on October 30 for a knee replacement She states that she is feeling much better but still has some  sinus drainage, congestion, and cough. She denies any discolored mucus or fever or chest pain, shortness of breath, or edema CXR last week w/ no acute process    Review of Systems Constitutional:   No  weight loss, night sweats,  Fevers, chills,  +fatigue, or  lassitude.  HEENT:   No headaches,  Difficulty swallowing,  Tooth/dental problems, or  Sore throat,                No sneezing, itching, ear ache,  +nasal congestion, post nasal drip,   CV:  No chest pain,  Orthopnea, PND, swelling in  lower extremities, anasarca, dizziness, palpitations, syncope.   GI  No heartburn, indigestion, abdominal pain, nausea, vomiting, diarrhea, change in bowel habits, loss of appetite, bloody stools.   Resp:   No coughing up of blood.    No chest wall deformity  Skin: no rash or lesions.  GU: no dysuria, change in color of urine, no urgency or frequency.  No flank pain, no hematuria   MS:  No joint pain or swelling.  No decreased range of motion.  No back pain.  Psych:  No change in mood or affect. No depression or anxiety.  No memory loss.        Objective:   Physical Exam  Gen: Pleasant, overwt, in no distress,  normal affect  ENT: No lesions,  mouth clear,  oropharynx clear, no postnasal drip  Neck: No JVD, no TMG, no carotid bruits  Lungs: No use of accessory muscles, no dullness to percussion, CTA   Cardiovascular: RRR, heart sounds normal, no murmur or gallops, no peripheral edema  Musculoskeletal: No deformities, no cyanosis or clubbing  Neuro: alert, non focal  Skin: Warm, no lesions or rashes  CXR -07/31/12 NAD   Assessment & Plan:

## 2012-08-04 NOTE — Assessment & Plan Note (Signed)
Slow to miild COPD Flare  w/ URI  CXR 10/24 w/out acute changes   Plan  Extend Omnicef 300mg  for an additional 2 days  Mucinex DM Twice daily  As needed  Cough/congestion  Fluids and rest  Begin Nasonex 2 puffs Twice daily  Until sample is gone  Saline nasal rinses As needed   Fluids and rest  Please contact office for sooner follow up if symptoms do not improve or worsen or seek emergency care

## 2012-08-04 NOTE — Patient Instructions (Addendum)
Extend Omnicef 300mg  for an additional 2 days  Mucinex DM Twice daily  As needed  Cough/congestion  Fluids and rest  Begin Nasonex 2 puffs Twice daily  Until sample is gone  Saline nasal rinses As needed   Fluids and rest  Please contact office for sooner follow up if symptoms do not improve or worsen or seek emergency care

## 2012-08-04 NOTE — Progress Notes (Signed)
UA and BMET faxed to Dr. Lequita Halt on 07/31/12

## 2012-08-05 ENCOUNTER — Other Ambulatory Visit: Payer: Self-pay | Admitting: Orthopedic Surgery

## 2012-08-05 NOTE — H&P (Signed)
[name]  DOB: July 31, 1933 Divorced / Language: English / Race: White Female  Date of Admission:  08/06/2012  Chief Complaint:  Unstable left knee  History of Present Illness The patient is a 78 year old female who comes in for a preoperative History and Physical. The patient is scheduled for a left left total knee revision to be performed by Dr. Dione Plover. Aluisio, MD on 05/07/2012. The patient is being followed for their left painful total knee replacement. They are now 8 year(s) out from surgery. Symptoms reported today include: pain, swelling, instability, pain with weightbearing and difficulty ambulating. The patient feels that they are doing poorly and report their pain level to be moderate (pain is constant). Current treatment includes: NSAIDs (celebrex). The following medication has been used for pain control: antiinflammatory medication (celebrex). The knee continues to get worse. It is very unstable. When she wears the brace, it does help some, but not eliminating all of her discomfort in laxity. She is ready to procedd with the revision surgery. They have been treated conservatively in the past for the above stated problem and despite conservative measures, they continue to have progressive pain and severe functional limitations and dysfunction. They have failed non-operative management. It is felt that they would benefit from undergoing total joint replacement. Risks and benefits of the procedure have been discussed with the patient and they elect to proceed with surgery. There are no active contraindications to surgery such as ongoing infection or rapidly progressive neurological disease.      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 09/07/2010 Itching.   Family History Cancer. mother and father Mother. Deceased, Colon Cancer. age 26 Father. Deceased, Lung Cancer. age 58;  Pacemaker   Social History Tobacco / smoke exposure. no Tobacco use. former smoker; smoke(d) 3 or more pack(s) per day Previously in rehab. no Children. 1 Current work status. retired Engineer, agricultural (Currently). no Alcohol use. current drinker; drinks wine; only occasionally per week Marital status. divorced Number of flights of stairs before winded. 2-3 Pain Contract. no Exercise. Exercises daily; does other Illicit drug use. no Living situation. live alone Post-Surgical Plans. Plans to look into SNF - [facility]. Advance Directives. Healthcare POA   Medication History Levothyroxine Sodium (112MCG Tablet, Oral) Active. Diphenoxylate-Atropine (2.5-0.025MG Tablet, Oral) Active. ALPRAZolam (0.5MG Tablet, Oral) Active. Spiriva HandiHaler (18MCG Capsule, Inhalation) Active. Xopenex HFA (45MCG/ACT Aerosol, Inhalation) Active.   Past Surgical History Colon Polyp Removal - Colonoscopy Gallbladder Surgery. Date: 11/2009. laporoscopic Total Knee Replacement. Date: 09/2003. left Arthroscopy of Knee. left   Past Medical History Chronic Obstructive Lung Disease Hypothyroidism Irritable bowel syndrome Impaired Vision. wears glasses Shingles Bronchitis. Past History Pneumonia. Past History Hiatal Hernia Osteopenia Measles Mumps Rubella Psoriasis   Review of Systems General:Not Present- Chills, Fever, Night Sweats, Fatigue, Weight Gain, Weight Loss and Memory Loss. Skin:Not Present- Hives, Itching, Rash, Eczema and Lesions. HEENT:Not Present- Tinnitus, Headache, Double Vision, Visual Loss, Hearing Loss and Dentures. Respiratory:Present- Shortness of breath with exertion. Not Present- Shortness of breath at rest, Allergies, Coughing up blood and Chronic Cough. Cardiovascular:Not Present- Chest Pain, Racing/skipping heartbeats, Difficulty Breathing Lying Down, Murmur, Swelling and Palpitations. Gastrointestinal:Present- Heartburn and  Diarrhea. Not Present- Bloody Stool, Abdominal Pain, Vomiting, Nausea, Constipation, Difficulty Swallowing, Jaundice and Loss of appetitie. Female Genitourinary:Not Present- Blood in Urine, Urinary frequency, Weak urinary stream, Discharge, Flank Pain, Incontinence, Painful Urination, Urgency, Urinary Retention and Urinating at Night. Musculoskeletal:Present- Joint Pain and Back  Pain. Not Present- Muscle Weakness, Muscle Pain, Joint Swelling, Morning Stiffness and Spasms. Neurological:Not Present- Tremor, Dizziness, Blackout spells, Paralysis, Difficulty with balance and Weakness. Psychiatric:Not Present- Insomnia.   Vitals Weight: 173 lb Height: 65 in Body Surface Area: 1.9 m Body Mass Index: 28.79 kg/m Pulse: 72 (Regular) Resp.: 12 (Unlabored) BP: 124/62 (Sitting, Right Arm, Standard)    Physical Exam The physical exam findings are as follows:  Note: Pateint is a 78 year old female with continued knee pain and instability.   General Mental Status - Alert, cooperative and good historian. General Appearance- pleasant. Not in acute distress. Orientation- Oriented X3. Build & Nutrition- Well nourished and Well developed.   Head and Neck Head- normocephalic, atraumatic . Neck Global Assessment- supple. no bruit auscultated on the right and no bruit auscultated on the left.   Eye Pupil- Bilateral- Regular and Round. Note: wears glasses Motion- Bilateral- EOMI.   ENMT partial lower denture  Chest and Lung Exam Auscultation: Breath sounds:- clear at anterior chest wall and - clear at posterior chest wall. Adventitious sounds:- No Adventitious sounds.   Cardiovascular Auscultation:Rhythm- Regular rate and rhythm. Heart Sounds- S1 WNL and S2 WNL. Murmurs & Other Heart Sounds:Auscultation of the heart reveals - No Murmurs.   Abdomen Palpation/Percussion:Tenderness- Abdomen is non-tender to palpation. Rigidity (guarding)-  Abdomen is soft. Auscultation:Auscultation of the abdomen reveals - Bowel sounds normal.   Female Genitourinary Not done, not pertinent to present illness  Musculoskeletal She is alert and oriented in no apparent distress. Her left hip can be flexed to 110, rotated in 20, out 30, abducted 30 without discomfort. Her left knee with the brace off shows gross varus/valgus laxity. Her range of motion is about 5 degrees of hyperextension to about 120 degrees of flexion. She also has AP laxity and 90 degrees of flexion.  RADIOGRAPHS: The knee components appear to be good position, but again she has the gross laxity clinically.  Assessment & Plan S/P total knee replacement (V43.65)  Instability of knee joint, left (694.50)  Note: Patient is for a left total knee revision by Dr. Wynelle Link.  Plan is to look into rehab - [place]  PCP - Dr. Asencion Noble  Signed electronically by Jeneen Montgomery, PA-C

## 2012-08-06 ENCOUNTER — Inpatient Hospital Stay (HOSPITAL_COMMUNITY)
Admission: RE | Admit: 2012-08-06 | Discharge: 2012-08-09 | DRG: 467 | Disposition: A | Discharge status: Skilled Nursing Facility | Payer: Medicare Other | Source: Ambulatory Visit | Attending: Orthopedic Surgery | Admitting: Orthopedic Surgery

## 2012-08-06 ENCOUNTER — Encounter (HOSPITAL_COMMUNITY)
Admission: RE | Disposition: A | Discharge status: Skilled Nursing Facility | Payer: Self-pay | Source: Ambulatory Visit | Attending: Orthopedic Surgery

## 2012-08-06 ENCOUNTER — Ambulatory Visit (HOSPITAL_COMMUNITY): Payer: Medicare Other | Admitting: Anesthesiology

## 2012-08-06 ENCOUNTER — Encounter (HOSPITAL_COMMUNITY): Payer: Self-pay | Admitting: *Deleted

## 2012-08-06 ENCOUNTER — Encounter (HOSPITAL_COMMUNITY): Payer: Self-pay | Admitting: Anesthesiology

## 2012-08-06 DIAGNOSIS — J449 Chronic obstructive pulmonary disease, unspecified: Secondary | ICD-10-CM | POA: Diagnosis present

## 2012-08-06 DIAGNOSIS — D62 Acute posthemorrhagic anemia: Secondary | ICD-10-CM

## 2012-08-06 DIAGNOSIS — Z96659 Presence of unspecified artificial knee joint: Secondary | ICD-10-CM

## 2012-08-06 DIAGNOSIS — K219 Gastro-esophageal reflux disease without esophagitis: Secondary | ICD-10-CM | POA: Diagnosis present

## 2012-08-06 DIAGNOSIS — Y831 Surgical operation with implant of artificial internal device as the cause of abnormal reaction of the patient, or of later complication, without mention of misadventure at the time of the procedure: Secondary | ICD-10-CM | POA: Diagnosis present

## 2012-08-06 DIAGNOSIS — J4489 Other specified chronic obstructive pulmonary disease: Secondary | ICD-10-CM | POA: Diagnosis present

## 2012-08-06 DIAGNOSIS — E039 Hypothyroidism, unspecified: Secondary | ICD-10-CM | POA: Diagnosis present

## 2012-08-06 DIAGNOSIS — T84028A Dislocation of other internal joint prosthesis, initial encounter: Secondary | ICD-10-CM

## 2012-08-06 DIAGNOSIS — T84029A Dislocation of unspecified internal joint prosthesis, initial encounter: Principal | ICD-10-CM | POA: Diagnosis present

## 2012-08-06 HISTORY — PX: TOTAL KNEE REVISION: SHX996

## 2012-08-06 LAB — TYPE AND SCREEN
ABO/RH(D): O POS
Antibody Screen: NEGATIVE

## 2012-08-06 LAB — ABO/RH: ABO/RH(D): O POS

## 2012-08-06 SURGERY — TOTAL KNEE REVISION
Anesthesia: Monitor Anesthesia Care | Site: Knee | Laterality: Left | Wound class: Clean

## 2012-08-06 MED ORDER — LEVOTHYROXINE SODIUM 112 MCG PO TABS
112.0000 ug | ORAL_TABLET | Freq: Every day | ORAL | Status: DC
  Administered 2012-08-07 – 2012-08-09 (×3): 112 ug via ORAL
  Filled 2012-08-06 (×4): qty 1

## 2012-08-06 MED ORDER — BUPIVACAINE 0.25 % ON-Q PUMP SINGLE CATH 300ML
INJECTION | Status: DC | PRN
  Administered 2012-08-06: 300 mL

## 2012-08-06 MED ORDER — METOCLOPRAMIDE HCL 5 MG/ML IJ SOLN: 5.0000 mg | Freq: Three times a day (TID) | INTRAMUSCULAR | Status: DC | PRN

## 2012-08-06 MED ORDER — METHYLCELLULOSE 1 % OP SOLN
1.0000 [drp] | Freq: Two times a day (BID) | OPHTHALMIC | Status: DC | PRN
Start: 2012-08-06 — End: 2012-08-06

## 2012-08-06 MED ORDER — OXYCODONE HCL 5 MG/5ML PO SOLN
5.0000 mg | Freq: Once | ORAL | Status: DC | PRN
  Filled 2012-08-06: qty 5

## 2012-08-06 MED ORDER — OXYCODONE HCL 5 MG PO TABS: 5.0000 mg | ORAL_TABLET | Freq: Once | ORAL | Status: DC | PRN

## 2012-08-06 MED ORDER — MORPHINE SULFATE 10 MG/ML IJ SOLN
INTRAMUSCULAR | Status: AC
  Filled 2012-08-06: qty 1

## 2012-08-06 MED ORDER — MEPERIDINE HCL 50 MG/ML IJ SOLN: 6.2500 mg | INTRAMUSCULAR | Status: DC | PRN

## 2012-08-06 MED ORDER — METOCLOPRAMIDE HCL 10 MG PO TABS: 5.0000 mg | ORAL_TABLET | Freq: Three times a day (TID) | ORAL | Status: DC | PRN

## 2012-08-06 MED ORDER — PHENOL 1.4 % MT LIQD: 1.0000 | OROMUCOSAL | Status: DC | PRN

## 2012-08-06 MED ORDER — DOCUSATE SODIUM 100 MG PO CAPS
100.0000 mg | ORAL_CAPSULE | Freq: Two times a day (BID) | ORAL | Status: DC
  Administered 2012-08-06 – 2012-08-07 (×3): 100 mg via ORAL

## 2012-08-06 MED ORDER — ACETAMINOPHEN 10 MG/ML IV SOLN
1000.0000 mg | Freq: Four times a day (QID) | INTRAVENOUS | Status: AC
  Administered 2012-08-06 – 2012-08-07 (×4): 1000 mg via INTRAVENOUS
  Filled 2012-08-06 (×4): qty 100

## 2012-08-06 MED ORDER — FLEET ENEMA 7-19 GM/118ML RE ENEM: 1.0000 | ENEMA | Freq: Once | RECTAL | Status: AC | PRN

## 2012-08-06 MED ORDER — SODIUM CHLORIDE 0.9 % IV SOLN: INTRAVENOUS | Status: DC

## 2012-08-06 MED ORDER — LACTATED RINGERS IV SOLN
INTRAVENOUS | Status: DC | PRN
  Administered 2012-08-06 (×2): via INTRAVENOUS

## 2012-08-06 MED ORDER — DEXTROSE 5 % IV SOLN
3.0000 g | INTRAVENOUS | Status: AC
  Administered 2012-08-06: 2 g via INTRAVENOUS

## 2012-08-06 MED ORDER — CEFAZOLIN SODIUM-DEXTROSE 2-3 GM-% IV SOLR
INTRAVENOUS | Status: AC
  Filled 2012-08-06: qty 50

## 2012-08-06 MED ORDER — PROPOFOL 10 MG/ML IV BOLUS
INTRAVENOUS | Status: DC | PRN
  Administered 2012-08-06 (×2): 20 mg via INTRAVENOUS

## 2012-08-06 MED ORDER — DIPHENOXYLATE-ATROPINE 2.5-0.025 MG PO TABS: 1.0000 | ORAL_TABLET | Freq: Three times a day (TID) | ORAL | Status: DC | PRN

## 2012-08-06 MED ORDER — TIOTROPIUM BROMIDE MONOHYDRATE 18 MCG IN CAPS
18.0000 ug | ORAL_CAPSULE | Freq: Every day | RESPIRATORY_TRACT | Status: DC
  Administered 2012-08-07 – 2012-08-08 (×2): 18 ug via RESPIRATORY_TRACT
  Filled 2012-08-06: qty 5

## 2012-08-06 MED ORDER — SODIUM CHLORIDE 0.9 % IV SOLN
INTRAVENOUS | Status: DC
  Administered 2012-08-06: 1000 mL via INTRAVENOUS
  Administered 2012-08-07: 02:00:00 via INTRAVENOUS

## 2012-08-06 MED ORDER — ACETAMINOPHEN 325 MG PO TABS
650.0000 mg | ORAL_TABLET | Freq: Four times a day (QID) | ORAL | Status: DC | PRN
  Administered 2012-08-08 (×2): 650 mg via ORAL
  Filled 2012-08-06 (×2): qty 2

## 2012-08-06 MED ORDER — TRAMADOL HCL 50 MG PO TABS: 50.0000 mg | ORAL_TABLET | Freq: Four times a day (QID) | ORAL | Status: DC | PRN

## 2012-08-06 MED ORDER — MENTHOL 3 MG MT LOZG: 1.0000 | LOZENGE | OROMUCOSAL | Status: DC | PRN

## 2012-08-06 MED ORDER — LORATADINE 10 MG PO TABS
10.0000 mg | ORAL_TABLET | Freq: Every day | ORAL | Status: DC
  Administered 2012-08-06 – 2012-08-09 (×4): 10 mg via ORAL
  Filled 2012-08-06 (×4): qty 1

## 2012-08-06 MED ORDER — HYDROMORPHONE HCL PF 1 MG/ML IJ SOLN
INTRAMUSCULAR | Status: AC
  Filled 2012-08-06: qty 1

## 2012-08-06 MED ORDER — METHOCARBAMOL 500 MG PO TABS
500.0000 mg | ORAL_TABLET | Freq: Four times a day (QID) | ORAL | Status: DC | PRN
  Administered 2012-08-06 – 2012-08-09 (×3): 500 mg via ORAL
  Filled 2012-08-06 (×3): qty 1

## 2012-08-06 MED ORDER — CEFAZOLIN SODIUM 1-5 GM-% IV SOLN
1.0000 g | Freq: Four times a day (QID) | INTRAVENOUS | Status: AC
  Administered 2012-08-06 (×2): 1 g via INTRAVENOUS
  Filled 2012-08-06 (×3): qty 50

## 2012-08-06 MED ORDER — ACETAMINOPHEN 650 MG RE SUPP: 650.0000 mg | Freq: Four times a day (QID) | RECTAL | Status: DC | PRN

## 2012-08-06 MED ORDER — LEVALBUTEROL TARTRATE 45 MCG/ACT IN AERO: 1.0000 | INHALATION_SPRAY | RESPIRATORY_TRACT | Status: DC | PRN

## 2012-08-06 MED ORDER — BISACODYL 10 MG RE SUPP: 10.0000 mg | Freq: Every day | RECTAL | Status: DC | PRN

## 2012-08-06 MED ORDER — PROPOFOL INFUSION 10 MG/ML OPTIME
INTRAVENOUS | Status: DC | PRN
  Administered 2012-08-06: 50 ug/kg/min via INTRAVENOUS

## 2012-08-06 MED ORDER — 0.9 % SODIUM CHLORIDE (POUR BTL) OPTIME
TOPICAL | Status: DC | PRN
  Administered 2012-08-06: 1000 mL

## 2012-08-06 MED ORDER — ACETAMINOPHEN 10 MG/ML IV SOLN
INTRAVENOUS | Status: AC
  Filled 2012-08-06: qty 100

## 2012-08-06 MED ORDER — ACETAMINOPHEN 10 MG/ML IV SOLN: 1000.0000 mg | Freq: Once | INTRAVENOUS | Status: DC | PRN

## 2012-08-06 MED ORDER — DEXAMETHASONE 6 MG PO TABS
10.0000 mg | ORAL_TABLET | Freq: Once | ORAL | Status: AC
  Administered 2012-08-07: 10 mg via ORAL
  Filled 2012-08-06: qty 1

## 2012-08-06 MED ORDER — HYDROMORPHONE HCL PF 1 MG/ML IJ SOLN
0.2500 mg | INTRAMUSCULAR | Status: DC | PRN
  Administered 2012-08-06 (×5): 0.5 mg via INTRAVENOUS

## 2012-08-06 MED ORDER — PHENYLEPHRINE HCL 10 MG/ML IJ SOLN
INTRAMUSCULAR | Status: DC | PRN
  Administered 2012-08-06 (×4): 40 ug via INTRAVENOUS

## 2012-08-06 MED ORDER — POLYVINYL ALCOHOL 1.4 % OP SOLN: 1.0000 [drp] | Freq: Two times a day (BID) | OPHTHALMIC | Status: DC | PRN

## 2012-08-06 MED ORDER — POLYETHYLENE GLYCOL 3350 17 G PO PACK: 17.0000 g | PACK | Freq: Every day | ORAL | Status: DC | PRN

## 2012-08-06 MED ORDER — ACETAMINOPHEN 10 MG/ML IV SOLN
1000.0000 mg | Freq: Once | INTRAVENOUS | Status: DC
Start: 2012-08-06 — End: 2012-08-06

## 2012-08-06 MED ORDER — HYDROMORPHONE HCL PF 1 MG/ML IJ SOLN
INTRAMUSCULAR | Status: AC
  Filled 2012-08-06: qty 2

## 2012-08-06 MED ORDER — FENTANYL CITRATE 0.05 MG/ML IJ SOLN
INTRAMUSCULAR | Status: DC | PRN
  Administered 2012-08-06: 50 ug via INTRAVENOUS

## 2012-08-06 MED ORDER — OXYCODONE HCL 5 MG PO TABS
5.0000 mg | ORAL_TABLET | ORAL | Status: DC | PRN
  Administered 2012-08-06 – 2012-08-07 (×4): 10 mg via ORAL
  Administered 2012-08-07: 5 mg via ORAL
  Administered 2012-08-07 – 2012-08-08 (×3): 10 mg via ORAL
  Administered 2012-08-08: 5 mg via ORAL
  Administered 2012-08-08 – 2012-08-09 (×3): 10 mg via ORAL
  Filled 2012-08-06 (×4): qty 2
  Filled 2012-08-06: qty 1
  Filled 2012-08-06 (×7): qty 2

## 2012-08-06 MED ORDER — METHOCARBAMOL 100 MG/ML IJ SOLN
500.0000 mg | Freq: Four times a day (QID) | INTRAVENOUS | Status: DC | PRN
  Administered 2012-08-06: 500 mg via INTRAVENOUS
  Filled 2012-08-06 (×2): qty 5

## 2012-08-06 MED ORDER — LACTATED RINGERS IV SOLN: INTRAVENOUS | Status: DC

## 2012-08-06 MED ORDER — PROMETHAZINE HCL 25 MG/ML IJ SOLN: 6.2500 mg | INTRAMUSCULAR | Status: DC | PRN

## 2012-08-06 MED ORDER — BUPIVACAINE 0.25 % ON-Q PUMP SINGLE CATH 300ML
INJECTION | Status: AC
  Filled 2012-08-06: qty 300

## 2012-08-06 MED ORDER — DIPHENHYDRAMINE HCL 12.5 MG/5ML PO ELIX: 12.5000 mg | ORAL_SOLUTION | ORAL | Status: DC | PRN

## 2012-08-06 MED ORDER — SODIUM CHLORIDE 0.9 % IR SOLN
Status: DC | PRN
  Administered 2012-08-06: 1000 mL

## 2012-08-06 MED ORDER — ALPRAZOLAM 0.25 MG PO TABS: 0.2500 mg | ORAL_TABLET | Freq: Every evening | ORAL | Status: DC | PRN

## 2012-08-06 MED ORDER — ACETAMINOPHEN 10 MG/ML IV SOLN
INTRAVENOUS | Status: DC | PRN
  Administered 2012-08-06: 1000 mg via INTRAVENOUS

## 2012-08-06 MED ORDER — BUPIVACAINE HCL (PF) 0.75 % IJ SOLN
INTRAMUSCULAR | Status: DC | PRN
  Administered 2012-08-06: 12 mg

## 2012-08-06 MED ORDER — MORPHINE SULFATE 10 MG/ML IJ SOLN: 1.0000 mg | INTRAMUSCULAR | Status: DC | PRN

## 2012-08-06 MED ORDER — DEXAMETHASONE SODIUM PHOSPHATE 10 MG/ML IJ SOLN
10.0000 mg | Freq: Once | INTRAMUSCULAR | Status: AC
  Filled 2012-08-06: qty 1

## 2012-08-06 MED ORDER — RIVAROXABAN 10 MG PO TABS
10.0000 mg | ORAL_TABLET | Freq: Every day | ORAL | Status: DC
  Administered 2012-08-07 – 2012-08-09 (×3): 10 mg via ORAL
  Filled 2012-08-06 (×4): qty 1

## 2012-08-06 MED ORDER — ONDANSETRON HCL 4 MG PO TABS: 4.0000 mg | ORAL_TABLET | Freq: Four times a day (QID) | ORAL | Status: DC | PRN

## 2012-08-06 MED ORDER — BUPIVACAINE ON-Q PAIN PUMP (FOR ORDER SET NO CHG)
INJECTION | Status: DC
  Filled 2012-08-06: qty 1

## 2012-08-06 MED ORDER — ONDANSETRON HCL 4 MG/2ML IJ SOLN: 4.0000 mg | Freq: Four times a day (QID) | INTRAMUSCULAR | Status: DC | PRN

## 2012-08-06 MED ORDER — PANTOPRAZOLE SODIUM 40 MG PO TBEC
40.0000 mg | DELAYED_RELEASE_TABLET | Freq: Every day | ORAL | Status: DC
  Filled 2012-08-06: qty 1

## 2012-08-06 SURGICAL SUPPLY — 74 items
ADAPTER BOLT FEMORAL +2/-2 (Knees) ×1 IMPLANT
ADPR FEM +2/-2 OFST BOLT (Knees) ×1 IMPLANT
ADPR FEM 5D STRL KN PFC SGM (Orthopedic Implant) ×1 IMPLANT
AUG FEM SZ2.5 4STRL LF KN LT (Knees) ×1 IMPLANT
AUG FEM SZ2.5 8 STRL LF KN LT (Knees) ×1 IMPLANT
AUG TIB SZ2.5 10 REV STP WDG (Knees) ×2 IMPLANT
AUGMENT DIST PFC 4MM (Knees) IMPLANT
BAG SPEC THK2 15X12 ZIP CLS (MISCELLANEOUS) ×1
BAG ZIPLOCK 12X15 (MISCELLANEOUS) ×2 IMPLANT
BANDAGE ELASTIC 6 VELCRO ST LF (GAUZE/BANDAGES/DRESSINGS) ×2 IMPLANT
BANDAGE ESMARK 6X9 LF (GAUZE/BANDAGES/DRESSINGS) ×1 IMPLANT
BLADE SAG 18X100X1.27 (BLADE) ×2 IMPLANT
BLADE SAW SGTL 11.0X1.19X90.0M (BLADE) ×2 IMPLANT
BNDG CMPR 9X6 STRL LF SNTH (GAUZE/BANDAGES/DRESSINGS) ×1
BNDG ESMARK 6X9 LF (GAUZE/BANDAGES/DRESSINGS) ×2
BONE CEMENT GENTAMICIN (Cement) ×6 IMPLANT
CATH KIT ON-Q SILVERSOAK 5 (CATHETERS) ×1 IMPLANT
CATH KIT ON-Q SILVERSOAK 5IN (CATHETERS) ×2 IMPLANT
CEMENT RESTRICTOR DEPUY SZ 4 (Cement) ×1 IMPLANT
CLOTH BEACON ORANGE TIMEOUT ST (SAFETY) ×2 IMPLANT
CONT SPECI 4OZ STER CLIK (MISCELLANEOUS) ×2 IMPLANT
CUFF TOURN SGL QUICK 34 (TOURNIQUET CUFF) ×2
CUFF TRNQT CYL 34X4X40X1 (TOURNIQUET CUFF) ×1 IMPLANT
DISAL AUG PFC 4MM (Knees) ×2 IMPLANT
DRAPE EXTREMITY T 121X128X90 (DRAPE) ×2 IMPLANT
DRAPE POUCH INSTRU U-SHP 10X18 (DRAPES) ×2 IMPLANT
DRAPE U-SHAPE 47X51 STRL (DRAPES) ×2 IMPLANT
DRSG ADAPTIC 3X8 NADH LF (GAUZE/BANDAGES/DRESSINGS) ×2 IMPLANT
DRSG PAD ABDOMINAL 8X10 ST (GAUZE/BANDAGES/DRESSINGS) ×1 IMPLANT
DURAPREP 26ML APPLICATOR (WOUND CARE) ×2 IMPLANT
ELECT REM PT RETURN 9FT ADLT (ELECTROSURGICAL) ×2
ELECTRODE REM PT RTRN 9FT ADLT (ELECTROSURGICAL) ×1 IMPLANT
EVACUATOR 1/8 PVC DRAIN (DRAIN) ×2 IMPLANT
FACESHIELD LNG OPTICON STERILE (SAFETY) ×10 IMPLANT
FEMORAL ADAPTER (Orthopedic Implant) ×1 IMPLANT
FEMORAL PFC TC3 (Orthopedic Implant) ×1 IMPLANT
GLOVE BIO SURGEON STRL SZ8 (GLOVE) ×2 IMPLANT
GLOVE BIOGEL PI IND STRL 8 (GLOVE) ×2 IMPLANT
GLOVE BIOGEL PI INDICATOR 8 (GLOVE) ×2
GLOVE ECLIPSE 8.0 STRL XLNG CF (GLOVE) ×2 IMPLANT
GLOVE SURG SS PI 6.5 STRL IVOR (GLOVE) ×4 IMPLANT
GOWN STRL NON-REIN LRG LVL3 (GOWN DISPOSABLE) ×4 IMPLANT
GOWN STRL REIN XL XLG (GOWN DISPOSABLE) ×2 IMPLANT
HANDPIECE INTERPULSE COAX TIP (DISPOSABLE) ×2
IMMOBILIZER KNEE 20 (SOFTGOODS) ×2
IMMOBILIZER KNEE 20 THIGH 36 (SOFTGOODS) ×1 IMPLANT
INSERT TC3 RP TIBIAL  2.5 22.5 ×1 IMPLANT
KIT BASIN OR (CUSTOM PROCEDURE TRAY) ×2 IMPLANT
MANIFOLD NEPTUNE II (INSTRUMENTS) ×2 IMPLANT
NS IRRIG 1000ML POUR BTL (IV SOLUTION) ×2 IMPLANT
PACK TOTAL JOINT (CUSTOM PROCEDURE TRAY) ×2 IMPLANT
PAD ABD 7.5X8 STRL (GAUZE/BANDAGES/DRESSINGS) ×2 IMPLANT
PADDING CAST COTTON 6X4 STRL (CAST SUPPLIES) ×4 IMPLANT
POSITIONER SURGICAL ARM (MISCELLANEOUS) ×2 IMPLANT
POST AUG PFC SIGMA 8MM 2.5 LT (Knees) ×1 IMPLANT
SET HNDPC FAN SPRY TIP SCT (DISPOSABLE) ×1 IMPLANT
SPONGE GAUZE 4X4 12PLY (GAUZE/BANDAGES/DRESSINGS) ×2 IMPLANT
STAPLER VISISTAT 35W (STAPLE) ×2 IMPLANT
STEM TIBIA PFC 13X30MM (Stem) ×1 IMPLANT
STEM UNIVERSAL REVISION 75X16 (Stem) ×1 IMPLANT
SUCTION FRAZIER 12FR DISP (SUCTIONS) ×2 IMPLANT
SUT VIC AB 2-0 CT1 27 (SUTURE) ×6
SUT VIC AB 2-0 CT1 TAPERPNT 27 (SUTURE) ×3 IMPLANT
SUT VLOC 180 0 24IN GS25 (SUTURE) ×1 IMPLANT
SWAB COLLECTION DEVICE MRSA (MISCELLANEOUS) ×1 IMPLANT
TOWEL OR 17X26 10 PK STRL BLUE (TOWEL DISPOSABLE) ×4 IMPLANT
TOWER CARTRIDGE SMART MIX (DISPOSABLE) ×2 IMPLANT
TRAY FOLEY CATH 14FRSI W/METER (CATHETERS) ×2 IMPLANT
TRAY REVISION SZ 2.5 (Knees) ×1 IMPLANT
TRAY SLEEVE CEM ML (Knees) ×1 IMPLANT
TUBE ANAEROBIC SPECIMEN COL (MISCELLANEOUS) ×1 IMPLANT
WATER STERILE IRR 1500ML POUR (IV SOLUTION) ×3 IMPLANT
WEDGE STEP 2.5 10MM (Knees) ×2 IMPLANT
WRAP KNEE MAXI GEL POST OP (GAUZE/BANDAGES/DRESSINGS) ×3 IMPLANT

## 2012-08-06 NOTE — Progress Notes (Signed)
Pt has recently had bronchitis and sinus infection. Has been taking antibiotics. Lungs clear this AM.

## 2012-08-06 NOTE — Evaluation (Signed)
Physical Therapy Evaluation Patient Details Name: [name] MRN: [id number] DOB: Jan 08, 1933 Today's Date: 08/06/2012 Time: 4098-1191 PT Time Calculation (min): 18 min  PT Assessment / Plan / Recommendation Clinical Impression  Pt s/p L knee revision.  Pt would benefit from acute PT services in order to improve independence with transfers and ambulation by increasing strength and ROM of L LE to prepare for d/c to SNF since pt lives alone.    PT Assessment  Patient needs continued PT services    Follow Up Recommendations  Post acute inpatient    Does the patient have the potential to tolerate intense rehabilitation   No, Recommend SNF  Barriers to Discharge        Equipment Recommendations  None recommended by PT    Recommendations for Other Services     Frequency 7X/week    Precautions / Restrictions Precautions Precautions: Knee Required Braces or Orthoses: Knee Immobilizer - Left Restrictions Weight Bearing Restrictions: No LLE Weight Bearing: Weight bearing as tolerated   Pertinent Vitals/Pain 4/10 L knee, premedicated, ice applied, repositioned      Mobility  Bed Mobility Bed Mobility: Supine to Sit Supine to Sit: 4: Min assist Details for Bed Mobility Assistance: verbal cues for technique, assist for L LE Transfers Transfers: Stand to Sit;Sit to Stand Sit to Stand: 4: Min guard;With upper extremity assist;From bed;From elevated surface Stand to Sit: 4: Min guard;With upper extremity assist;To chair/3-in-1 Details for Transfer Assistance: verbal cues for safe technique Ambulation/Gait Ambulation/Gait Assistance: 4: Min guard Ambulation Distance (Feet): 44 Feet Assistive device: Rolling walker Ambulation/Gait Assistance Details: verbal cues for sequence, RW distance Gait Pattern: Step-to pattern;Decreased stance time - left;Antalgic Gait velocity: decreased    Shoulder Instructions     Exercises     PT Diagnosis: Difficulty walking;Acute pain    PT Problem List: Decreased strength;Decreased range of motion;Decreased mobility;Pain PT Treatment Interventions: DME instruction;Gait training;Patient/family education;Functional mobility training;Therapeutic activities;Therapeutic exercise   PT Goals Acute Rehab PT Goals PT Goal Formulation: With patient Time For Goal Achievement: 08/13/12 Potential to Achieve Goals: Good Pt will go Supine/Side to Sit: with modified independence PT Goal: Supine/Side to Sit - Progress: Goal set today Pt will go Sit to Stand: with modified independence PT Goal: Sit to Stand - Progress: Goal set today Pt will go Stand to Sit: with modified independence PT Goal: Stand to Sit - Progress: Goal set today Pt will Ambulate: 51 - 150 feet;with modified independence;with least restrictive assistive device PT Goal: Ambulate - Progress: Goal set today Pt will Perform Home Exercise Program: with supervision, verbal cues required/provided PT Goal: Perform Home Exercise Program - Progress: Goal set today  Visit Information  Last PT Received On: 08/06/12 Assistance Needed: +1    Subjective Data  Subjective: "jump on your broom and fly away"  (joking with someone in hallway)   Prior Functioning  Home Living Lives With: Alone Available Help at Discharge: Skilled Nursing Facility Home Adaptive Equipment: Bedside commode/3-in-1;Straight cane;Walker - rolling Prior Function Level of Independence: Independent with assistive device(s) Communication Communication: No difficulties    Cognition  Overall Cognitive Status: Appears within functional limits for tasks assessed/performed Arousal/Alertness: Awake/alert Orientation Level: Appears intact for tasks assessed Behavior During Session: [facility] for tasks performed    Extremity/Trunk Assessment Right Upper Extremity Assessment RUE ROM/Strength/Tone: [facility] for tasks assessed Left Upper Extremity Assessment LUE ROM/Strength/Tone: [facility] for tasks assessed Right Lower  Extremity Assessment RLE ROM/Strength/Tone: [facility] for tasks assessed Left Lower Extremity Assessment LLE ROM/Strength/Tone: Deficits LLE  ROM/Strength/Tone Deficits: unable to perform SLR, maintained KI   Balance    End of Session PT - End of Session Equipment Utilized During Treatment: Left knee immobilizer Activity Tolerance: Patient limited by pain;Patient limited by fatigue Patient left: in chair;with call bell/phone within reach;with family/visitor present CPM Left Knee CPM Left Knee: Off (at 1500) Left Knee Flexion (Degrees): 40  Left Knee Extension (Degrees): 10   GP     Francys Bolin,KATHrine E 08/06/2012, 4:03 PM Pager: [phone]

## 2012-08-06 NOTE — Anesthesia Postprocedure Evaluation (Signed)
Anesthesia Post Note  Patient: [name]  Procedure(s) Performed: Procedure(s) (LRB): TOTAL KNEE REVISION (Left)  Anesthesia type: Spinal  Patient location: PACU  Post pain: Pain level controlled  Post assessment: Post-op Vital signs reviewed  Last Vitals: BP 120/68  Pulse 59  Temp 34.4 C (Oral)  Resp 15  SpO2 100%  Post vital signs: Reviewed  Level of consciousness: sedated  Complications: No apparent anesthesia complications

## 2012-08-06 NOTE — Anesthesia Procedure Notes (Signed)
Spinal  Patient location during procedure: OR Start time: 08/06/2012 7:18 AM End time: 08/06/2012 7:28 AM Staffing Anesthesiologist: Gaylan Gerold CRNA/Resident: Paris Lore Performed by: anesthesiologist and resident/CRNA  Preanesthetic Checklist Completed: patient identified, site marked, surgical consent, pre-op evaluation, timeout performed, IV checked, risks and benefits discussed and monitors and equipment checked Spinal Block Patient position: sitting Prep: Betadine Patient monitoring: heart rate, continuous pulse ox and blood pressure Location: L3-4 Injection technique: single-shot Needle Needle type: Spinocan  Needle gauge: 22 G Needle length: 9 cm Assessment Sensory level: T4 Additional Notes Expiration date of kit checked and confirmed. Patient tolerated procedure well, without complications. First attempt midline with 22g L2-L3 per CRNA [facility] without success.  MD. Renold Don at L3_L4 X 1 with clear CSF return easy fluid neg hemo.  T4 Level on exam pre procedure. Patient tolerated well.

## 2012-08-06 NOTE — Transfer of Care (Signed)
Immediate Anesthesia Transfer of Care Note  Patient: [name]  Procedure(s) Performed: Procedure(s) (LRB): TOTAL KNEE REVISION (Left)  Patient Location: PACU  Anesthesia Type: Regional  Level of Consciousness: sedated, patient cooperative and responds to stimulaton  Airway & Oxygen Therapy: Patient Spontanous Breathing and Patient connected to face mask oxgen  Post-op Assessment: Report given to PACU RN and Post -op Vital signs reviewed and stable  Post vital signs: Reviewed and stable  Complications: No apparent anesthesia complications Level T10 on exam and release to PACU

## 2012-08-06 NOTE — Anesthesia Preprocedure Evaluation (Addendum)
Anesthesia Evaluation  Patient identified by MRN, date of birth, ID band Patient awake    Reviewed: Allergy & Precautions, H&P , NPO status , Patient's Chart, lab work & pertinent test results  Airway Mallampati: II TM Distance: >3 FB Neck ROM: Full    Dental  (+) Dental Advisory Given and Teeth Intact   Pulmonary COPD breath sounds clear to auscultation  Pulmonary exam normal       Cardiovascular - CAD Rhythm:Regular Rate:Normal     Neuro/Psych negative neurological ROS  negative psych ROS   GI/Hepatic Neg liver ROS, GERD-  Medicated,  Endo/Other  Hypothyroidism   Renal/GU negative Renal ROS     Musculoskeletal  (+) Arthritis -, Osteoarthritis,    Abdominal   Peds  Hematology negative hematology ROS (+)   Anesthesia Other Findings   Reproductive/Obstetrics                         Anesthesia Physical Anesthesia Plan  ASA: II  Anesthesia Plan: Spinal and MAC   Post-op Pain Management:    Induction: Intravenous  Airway Management Planned: Simple Face Mask  Additional Equipment:   Intra-op Plan:   Post-operative Plan:   Informed Consent: I have reviewed the patients History and Physical, chart, labs and discussed the procedure including the risks, benefits and alternatives for the proposed anesthesia with the patient or authorized representative who has indicated his/her understanding and acceptance.   Dental advisory given  Plan Discussed with: CRNA  Anesthesia Plan Comments:         Anesthesia Quick Evaluation

## 2012-08-06 NOTE — Progress Notes (Signed)
Utilization review completed.  

## 2012-08-06 NOTE — Brief Op Note (Signed)
08/06/2012  8:51 AM  PATIENT:  Foy Guadal[place]ara Friedli  78 y.o. female  PRE-OPERATIVE DIAGNOSIS:  Unstable Left Total Knee Arthroplasty  POST-OPERATIVE DIAGNOSIS:  Unstable Left Total Knee Arthroplasty  PROCEDURE:  Procedure(s) (LRB) with comments: TOTAL KNEE REVISION (Left) - Left Total Knee Arthroplasty Revision  SURGEON:  Surgeon(s) and Role:    * [name], MD - Primary  PHYSICIAN ASSISTANT:   ASSISTANTS: Molli Barrows, PA-C   ANESTHESIA:   spinal  EBL:  Total I/O In: 2000 [I.V.:2000] Out: 100 [Blood:100]  BLOOD ADMINISTERED:none  DRAINS: (Medium) Hemovact drain(s) in the left knee with  Suction Open   LOCAL MEDICATIONS USED:  OTHER On-Q marcaine pain pump  COUNTS:  YES  TOURNIQUET:  Up 28 @ 300 mmHG, down 8 minutes; up 19 minutes @300  mmHG DICTATION: .Other Dictation: Dictation Number [id number]  PLAN OF CARE: Admit to inpatient   PATIENT DISPOSITION:  PACU - hemodynamically stable.

## 2012-08-06 NOTE — H&P (View-Only) (Signed)
[name]  DOB: 11/21/1932 Divorced / Language: English / Race: White Female  Date of Admission:  08/06/2012  Chief Complaint:  Unstable left knee  History of Present Illness The patient is a 78 year old female who comes in for a preoperative History and Physical. The patient is scheduled for a left left total knee revision to be performed by Dr. Frank V. Aluisio, MD on 05/07/2012. The patient is being followed for their left painful total knee replacement. They are now 8 year(s) out from surgery. Symptoms reported today include: pain, swelling, instability, pain with weightbearing and difficulty ambulating. The patient feels that they are doing poorly and report their pain level to be moderate (pain is constant). Current treatment includes: NSAIDs (celebrex). The following medication has been used for pain control: antiinflammatory medication (celebrex). The knee continues to get worse. It is very unstable. When she wears the brace, it does help some, but not eliminating all of her discomfort in laxity. She is ready to procedd with the revision surgery. They have been treated conservatively in the past for the above stated problem and despite conservative measures, they continue to have progressive pain and severe functional limitations and dysfunction. They have failed non-operative management. It is felt that they would benefit from undergoing total joint replacement. Risks and benefits of the procedure have been discussed with the patient and they elect to proceed with surgery. There are no active contraindications to surgery such as ongoing infection or rapidly progressive neurological disease.      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 09/07/2010 Itching.   Family History Cancer. mother and father Mother. Deceased, Colon Cancer. age 80 Father. Deceased, Lung Cancer. age 89;  Pacemaker   Social History Tobacco / smoke exposure. no Tobacco use. former smoker; smoke(d) 3 or more pack(s) per day Previously in rehab. no Children. 1 Current work status. retired Drug/Alcohol Rehab (Currently). no Alcohol use. current drinker; drinks wine; only occasionally per week Marital status. divorced Number of flights of stairs before winded. 2-3 Pain Contract. no Exercise. Exercises daily; does other Illicit drug use. no Living situation. live alone Post-Surgical Plans. Plans to look into SNF - [facility]. Advance Directives. Healthcare POA   Medication History Levothyroxine Sodium (112MCG Tablet, Oral) Active. Diphenoxylate-Atropine (2.5-0.025MG Tablet, Oral) Active. ALPRAZolam (0.5MG Tablet, Oral) Active. Spiriva HandiHaler (18MCG Capsule, Inhalation) Active. Xopenex HFA (45MCG/ACT Aerosol, Inhalation) Active.   Past Surgical History Colon Polyp Removal - Colonoscopy Gallbladder Surgery. Date: 11/2009. laporoscopic Total Knee Replacement. Date: 09/2003. left Arthroscopy of Knee. left   Past Medical History Chronic Obstructive Lung Disease Hypothyroidism Irritable bowel syndrome Impaired Vision. wears glasses Shingles Bronchitis. Past History Pneumonia. Past History Hiatal Hernia Osteopenia Measles Mumps Rubella Psoriasis   Review of Systems General:Not Present- Chills, Fever, Night Sweats, Fatigue, Weight Gain, Weight Loss and Memory Loss. Skin:Not Present- Hives, Itching, Rash, Eczema and Lesions. HEENT:Not Present- Tinnitus, Headache, Double Vision, Visual Loss, Hearing Loss and Dentures. Respiratory:Present- Shortness of breath with exertion. Not Present- Shortness of breath at rest, Allergies, Coughing up blood and Chronic Cough. Cardiovascular:Not Present- Chest Pain, Racing/skipping heartbeats, Difficulty Breathing Lying Down, Murmur, Swelling and Palpitations. Gastrointestinal:Present- Heartburn and  Diarrhea. Not Present- Bloody Stool, Abdominal Pain, Vomiting, Nausea, Constipation, Difficulty Swallowing, Jaundice and Loss of appetitie. Female Genitourinary:Not Present- Blood in Urine, Urinary frequency, Weak urinary stream, Discharge, Flank Pain, Incontinence, Painful Urination, Urgency, Urinary Retention and Urinating at Night. Musculoskeletal:Present- Joint Pain and Back   Pain. Not Present- Muscle Weakness, Muscle Pain, Joint Swelling, Morning Stiffness and Spasms. Neurological:Not Present- Tremor, Dizziness, Blackout spells, Paralysis, Difficulty with balance and Weakness. Psychiatric:Not Present- Insomnia.   Vitals Weight: 173 lb Height: 65 in Body Surface Area: 1.9 m Body Mass Index: 28.79 kg/m Pulse: 72 (Regular) Resp.: 12 (Unlabored) BP: 124/62 (Sitting, Right Arm, Standard)    Physical Exam The physical exam findings are as follows:  Note: Pateint is a 78 year old female with continued knee pain and instability.   General Mental Status - Alert, cooperative and good historian. General Appearance- pleasant. Not in acute distress. Orientation- Oriented X3. Build & Nutrition- Well nourished and Well developed.   Head and Neck Head- normocephalic, atraumatic . Neck Global Assessment- supple. no bruit auscultated on the right and no bruit auscultated on the left.   Eye Pupil- Bilateral- Regular and Round. Note: wears glasses Motion- Bilateral- EOMI.   ENMT partial lower denture  Chest and Lung Exam Auscultation: Breath sounds:- clear at anterior chest wall and - clear at posterior chest wall. Adventitious sounds:- No Adventitious sounds.   Cardiovascular Auscultation:Rhythm- Regular rate and rhythm. Heart Sounds- S1 WNL and S2 WNL. Murmurs & Other Heart Sounds:Auscultation of the heart reveals - No Murmurs.   Abdomen Palpation/Percussion:Tenderness- Abdomen is non-tender to palpation. Rigidity (guarding)-  Abdomen is soft. Auscultation:Auscultation of the abdomen reveals - Bowel sounds normal.   Female Genitourinary Not done, not pertinent to present illness  Musculoskeletal She is alert and oriented in no apparent distress. Her left hip can be flexed to 110, rotated in 20, out 30, abducted 30 without discomfort. Her left knee with the brace off shows gross varus/valgus laxity. Her range of motion is about 5 degrees of hyperextension to about 120 degrees of flexion. She also has AP laxity and 90 degrees of flexion.  RADIOGRAPHS: The knee components appear to be good position, but again she has the gross laxity clinically.  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 Aluisio.  Plan is to look into rehab - [facility]  PCP - Dr. Roy Fagan  Signed electronically by DREW L Daine Croker, PA-C  

## 2012-08-06 NOTE — Interval H&P Note (Signed)
History and Physical Interval Note:  08/06/2012 7:04 AM  [name]  has presented today for surgery, with the diagnosis of Unstable Left Total Knee Arthroplasty  The various methods of treatment have been discussed with the patient and family. After consideration of risks, benefits and other options for treatment, the patient has consented to  Procedure(s) (LRB) with comments: TOTAL KNEE REVISION (Left) - Left Total Knee Arthroplasty Revision as a surgical intervention .  The patient's history has been reviewed, patient examined, no change in status, stable for surgery.  I have reviewed the patient's chart and labs.  Questions were answered to the patient's satisfaction.     [name]

## 2012-08-07 ENCOUNTER — Encounter (HOSPITAL_COMMUNITY): Payer: Self-pay | Admitting: Orthopedic Surgery

## 2012-08-07 LAB — BASIC METABOLIC PANEL
BUN: 11 mg/dL (ref 6–23)
CO2: 26 mEq/L (ref 19–32)
Calcium: 7.8 mg/dL — ABNORMAL LOW (ref 8.4–10.5)
Chloride: 103 mEq/L (ref 96–112)
Creatinine, Ser: 0.72 mg/dL (ref 0.50–1.10)
Glucose, Bld: 157 mg/dL — ABNORMAL HIGH (ref 70–99)

## 2012-08-07 LAB — CBC
HCT: 30 % — ABNORMAL LOW (ref 36.0–46.0)
Hemoglobin: 10.2 g/dL — ABNORMAL LOW (ref 12.0–15.0)
MCH: 30.4 pg (ref 26.0–34.0)
MCV: 89.3 fL (ref 78.0–100.0)
RBC: 3.36 MIL/uL — ABNORMAL LOW (ref 3.87–5.11)
WBC: 12.9 10*3/uL — ABNORMAL HIGH (ref 4.0–10.5)

## 2012-08-07 MED ORDER — NON FORMULARY: 20.0000 mg | Freq: Two times a day (BID) | Status: DC

## 2012-08-07 MED ORDER — OMEPRAZOLE 20 MG PO CPDR
20.0000 mg | DELAYED_RELEASE_CAPSULE | Freq: Two times a day (BID) | ORAL | Status: DC
  Administered 2012-08-07 – 2012-08-09 (×4): 20 mg via ORAL
  Filled 2012-08-07 (×7): qty 1

## 2012-08-07 NOTE — Progress Notes (Signed)
Physical Therapy Treatment Patient Details Name: ARLYNNE MASTRIANO MRN: [id number] DOB: 1933/07/28 Today's Date: 08/07/2012 Time: 4098-1191 PT Time Calculation (min): 29 min  PT Assessment / Plan / Recommendation Comments on Treatment Session  Pt ambulated in hallway and performed exercises.  Pt also assisted to bathroom to void.    Follow Up Recommendations  Post acute inpatient     Does the patient have the potential to tolerate intense rehabilitation  No, Recommend SNF  Barriers to Discharge        Equipment Recommendations  None recommended by PT    Recommendations for Other Services    Frequency     Plan Discharge plan remains appropriate;Frequency remains appropriate    Precautions / Restrictions Precautions Precautions: Knee Required Braces or Orthoses: Knee Immobilizer - Left Restrictions Weight Bearing Restrictions: Yes LLE Weight Bearing: Weight bearing as tolerated   Pertinent Vitals/Pain 2/10 L knee, ice applied, premedicated    Mobility  Bed Mobility Bed Mobility: Supine to Sit Supine to Sit: 4: Min assist;HOB elevated Details for Bed Mobility Assistance: verbal cues for technique, assist for L LE Transfers Transfers: Stand to Sit;Sit to Stand Sit to Stand: 4: Min guard;With upper extremity assist;From bed;From elevated surface;From chair/3-in-1 Stand to Sit: 4: Min guard;With upper extremity assist;To chair/3-in-1 Details for Transfer Assistance: verbal cues for safe technique Ambulation/Gait Ambulation/Gait Assistance: 4: Min guard Ambulation Distance (Feet): 120 Feet Assistive device: Rolling walker Ambulation/Gait Assistance Details: verbal cues for safe RW distance Gait Pattern: Step-to pattern;Decreased stance time - left;Antalgic Gait velocity: decreased    Exercises Total Joint Exercises Ankle Circles/Pumps: AROM;Both;20 reps Quad Sets: AROM;Both;20 reps Gluteal Sets: AROM;Both;20 reps Short Arc Quad: AROM;Left;15 reps Heel Slides:  AAROM;Left;15 reps Hip ABduction/ADduction: AROM;Left;15 reps Straight Leg Raises: AROM;Left;10 reps Goniometric ROM: L knee AAROM -8-80* supine with heel slides   PT Diagnosis:    PT Problem List:   PT Treatment Interventions:     PT Goals Acute Rehab PT Goals PT Goal: Supine/Side to Sit - Progress: Progressing toward goal PT Goal: Sit to Stand - Progress: Progressing toward goal PT Goal: Stand to Sit - Progress: Progressing toward goal PT Goal: Ambulate - Progress: Progressing toward goal PT Goal: Perform Home Exercise Program - Progress: Progressing toward goal  Visit Information  Last PT Received On: 08/07/12 Assistance Needed: +1    Subjective Data  Subjective: I need to use the restroom first please.   Cognition  Overall Cognitive Status: Appears within functional limits for tasks assessed/performed    Balance     End of Session PT - End of Session Equipment Utilized During Treatment: Left knee immobilizer Activity Tolerance: Patient tolerated treatment well Patient left: in chair;with call bell/phone within reach;with family/visitor present CPM Left Knee CPM Left Knee: Off   GP     Leslee Haueter,KATHrine E 08/07/2012, 12:12 PM Pager: [phone]

## 2012-08-07 NOTE — Progress Notes (Signed)
OT Cancellation Note  Patient Details Name: [name] MRN: [id number] DOB: 02/19/1933   Cancelled Treatment:    Reason Eval/Treat Not Completed: Other (comment) (OT screen) Note pt is a knee revision with plan for SNF. Will defer OT to SNF for any needs.  Lennox Laity [phone] 08/07/2012, 9:47 AM

## 2012-08-07 NOTE — Progress Notes (Signed)
Clinical Social Work Department BRIEF PSYCHOSOCIAL ASSESSMENT 08/07/2012  Patient:  [name], [name]     Account Number:  [id number]     Admit date:  08/06/2012  Clinical Social Worker:  Candie Chroman  Date/Time:  08/07/2012 12:58 PM  Referred by:  Physician  Date Referred:  08/07/2012 Referred for  SNF Placement   Other Referral:   Interview type:  Patient Other interview type:    PSYCHOSOCIAL DATA Living Status:  ALONE Admitted from facility:   Level of care:   Primary support name:  [name] Primary support relationship to patient:  CHILD, ADULT Degree of support available:   unclear    CURRENT CONCERNS Current Concerns  Post-Acute Placement   Other Concerns:    SOCIAL WORK ASSESSMENT / PLAN Pt is a 78 yr old female living at home prior to hospitalization. CSW met with pt to assist with d/c planning . Pt is hoping to have ST SNF placement at [facility] following hospital d/c. CSW has initiated SNF search and will provide bed offers as received.[facility] contacted and expects to have availability for this pt.   Assessment/plan status:  Psychosocial Support/Ongoing Assessment of Needs Other assessment/ plan:   Information/referral to community resources:   None needed at this time.    PATIENT'S/FAMILY'S RESPONSE TO PLAN OF CARE: Pt would like ST Rehab at [facility]. Family members have been there for rehab and were very pleased with care.    Cori Razor LCSW [phone]

## 2012-08-07 NOTE — Progress Notes (Signed)
Clinical Social Work Department CLINICAL SOCIAL WORK PLACEMENT NOTE 08/07/2012  Patient:  [name], [name]  Account Number:  [id number] Admit date:  08/06/2012  Clinical Social Worker:  Cori Razor, LCSW  Date/time:  08/07/2012 01:06 PM  Clinical Social Work is seeking post-discharge placement for this patient at the following level of care:   SKILLED NURSING   (*CSW will update this form in Epic as items are completed)     Patient/family provided with [facility] Department of Clinical Social Work's list of facilities offering this level of care within the geographic area requested by the patient (or if unable, by the patient's family).    Patient/family informed of their freedom to choose among providers that offer the needed level of care, that participate in Medicare, Medicaid or managed care program needed by the patient, have an available bed and are willing to accept the patient.    Patient/family informed of [facility]' ownership interest in [facility], as well as of the fact that they are under no obligation to receive care at this facility.  PASARR submitted to EDS on 08/07/2012 PASARR number received from EDS on 08/07/2012  FL2 transmitted to all facilities in geographic area requested by pt/family on  08/07/2012 FL2 transmitted to all facilities within larger geographic area on   Patient informed that his/her managed care company has contracts with or will negotiate with  certain facilities, including the following:     Patient/family informed of bed offers received:   Patient chooses bed at  Physician recommends and patient chooses bed at    Patient to be transferred to  on   Patient to be transferred to facility by   The following physician request were entered in Epic:   Additional Comments:  Cori Razor LCSW [phone]

## 2012-08-07 NOTE — Progress Notes (Signed)
Physical Therapy Treatment Note   08/07/12 1500  PT Visit Information  Last PT Received On 08/07/12  Assistance Needed +1  PT Time Calculation  PT Start Time 1411  PT Stop Time 1435  PT Time Calculation (min) 24 min  Subjective Data  Subjective I'm all ready to go.  Precautions  Precautions Knee  Precaution Comments able to perform SLRs  Restrictions  LLE Weight Bearing WBAT  Cognition  Overall Cognitive Status Appears within functional limits for tasks assessed/performed  Bed Mobility  Bed Mobility Supine to Sit;Sit to Supine  Supine to Sit 5: Supervision  Sit to Supine 5: Supervision  Details for Bed Mobility Assistance increased time, able to move L LE onto/off of bed without support  Transfers  Transfers Stand to Sit;Sit to Stand  Sit to Stand 4: Min guard;With upper extremity assist;From bed  Stand to Sit 4: Min guard;With upper extremity assist;To bed  Details for Transfer Assistance verbal cues for safe technique  Ambulation/Gait  Ambulation/Gait Assistance 4: Min guard  Ambulation Distance (Feet) 120 Feet  Assistive device Rolling walker  Ambulation/Gait Assistance Details pt doing well with RW   Gait Pattern Step-to pattern;Decreased stance time - left;Antalgic  Gait velocity decreased  Total Joint Exercises  Ankle Circles/Pumps AROM;Both;10 reps  Quad Sets AROM;Both;10 reps  Gluteal Sets AROM;Both;10 reps  Towel Squeeze AROM;Both;10 reps  Short Arc Quad AROM;Left;10 reps  Heel Slides AROM;Left;10 reps;Seated  Hip ABduction/ADduction AROM;Left;10 reps  Straight Leg Raises 10 reps;AROM;Left  PT - End of Session  Activity Tolerance Patient tolerated treatment well  Patient left in bed;with call bell/phone within reach;with family/visitor present  PT - Assessment/Plan  Comments on Treatment Session Pt ambulated again and performed exercises.  Pt progressing very well.  PT Plan Discharge plan remains appropriate;Frequency remains appropriate  Follow Up  Recommendations Post acute inpatient  Does the patient have the potential to tolerate intense rehabilitation? No, Recommend SNF  Equipment Recommended None recommended by PT  Acute Rehab PT Goals  PT Goal: Supine/Side to Sit - Progress Progressing toward goal  PT Goal: Sit to Stand - Progress Progressing toward goal  PT Goal: Stand to Sit - Progress Progressing toward goal  PT Goal: Ambulate - Progress Progressing toward goal  PT Goal: Perform Home Exercise Program - Progress Progressing toward goal  PT General Charges  $$ ACUTE PT VISIT 1 Procedure  PT Treatments  $Gait Training 8-22 mins  $Therapeutic Exercise 8-22 mins    Pain 3/10 L knee, premedicated for therapy, ice applied  Zenovia Jarred, PT Pager: [phone]

## 2012-08-07 NOTE — Op Note (Signed)
NAMELEXANI, CORONA NO.:  [id number]  MEDICAL RECORD NO.:  [id number]  LOCATION:  47                         FACILITY:  [facility]  PHYSICIAN:  Gaynelle Arabian, M.D.    DATE OF BIRTH:  [birth year]  DATE OF PROCEDURE:  08/06/2012 DATE OF DISCHARGE:                              OPERATIVE REPORT   PREOPERATIVE DIAGNOSIS:  Unstable left total knee arthroplasty.  POSTOPERATIVE DIAGNOSIS:  Unstable left total knee arthroplasty.  PROCEDURE:  Left total knee arthroplasty revision.  SURGEON:  Gaynelle Arabian, M.D.  ASSISTANT:  Judith Part. Chabon, P.A.  ANESTHESIA:  Spinal.  ESTIMATED BLOOD LOSS:  Minimal.  DRAINS:  Hemovac x1.  TOURNIQUET TIME:  28 minutes at 300 mmHg, down 8 minutes, up additional 19 minutes at 300 mmHg.  COMPLICATIONS:  None.  CONDITION:  Stable to recovery.  BRIEF CLINICAL NOTE:  Ms. Heatherington is a 78 year old female who has a significantly painful unstable left total knee arthroplasty.  Workup was negative for infection.  She did better when she was wearing a brace confirming that the pain was due to the instability.  The knee was grossly unstable and she presents now for revision total knee arthroplasty.  PROCEDURE IN DETAIL:  After successful administration of spinal anesthetic, tourniquet was placed high on the left thigh and her left lower extremity was prepped and draped in the usual sterile fashion. Extremity was wrapped in Esmarch.  The knee was flexed and tourniquet was inflated to 300 mmHg.  Exam under anesthesia shows that she has gross varus-valgus and anterior-posterior laxity.  The skin was cut with a 10-blade for the subcutaneous tissue to the level of the extensor mechanism.  Fresh blade was used to make a medial parapatellar arthrotomy.  No evidence of any abnormal fluid in the joint.  Soft tissue on the proximal medial tibia was subperiosteally elevated to the joint line with the knife and into the semimembranosus bursa with a  Cobb elevator.  Soft tissue laterally was elevated with attention being paid to avoid the patellar tendon on tibial tubercle.  Patella was everted and knee flexed to 90 degrees.  The tibia was completely dislocated forward from the femur even with the polyethylene intact.  Polyethylene was removed from the tibial tray.  Excellent exposure was obtained.  The retractors were placed circumferentially around the tibia.  An oscillating saw was used to disrupt the interface between the tibial component and bone.  The tibia was easily removed, in fact was loose. Cement was then removed from the proximal tibia and tibial canal.  The canal was thoroughly irrigated and then reamed up to 13 mm. Extramedullary tibial alignment guide was placed, referencing proximally at the medial aspect to the tibial tubercle and distally along the second metatarsal axis and tibial crest.  Block was pinned to remove 2 mm off the cut bone surface to create a flat level bony surface.  We then prepped with a 28-mm sleeve for rotational stability.  The size 2.5 was the most appropriate tibial component and proximal tibia was prepared to modular drill and then a keel punch.  A size 2.5 MBT revision tray with a 29 sleeve.  I then removed the femoral  component by disrupting the interface between the component and bone.  There was minimal of any bone loss.  We placed a starter reamer into the femoral canal and thoroughly irrigated the canal.  I reamed with the 16 mm, which had an excellent press-fit.  The reamer was left intact to serve as our intramedullary guide.  Distal femoral cutting block was placed.  She had very large flexion and extension gap, so I made the cut a barely skipping bone medial and having go up in a +4 position lateral.  I decided to build down distally and placed 4-mm augment medial and 8-mm augment lateral.  The 2.5 was most appropriate femoral size and the cutting block was placed, rotation marked  at the epicondylar axis confirmed by creating a rectangular flexion gap at 90 degrees.  The block was placed in a +2 position, which would effectively raise the stem and lower the trochlear flange down to the anterior cortex of the femur.  The anterior and posterior chamfer cuts were made.  Intercondylar block was placed and cuts were made for the TC3.  Femoral trial was placed, this was a size 2.5 TC3 with a 16 x 75 stem extension and a +2 position 5 degrees valgus and then an 8-mm distal lateral augment, 4-mm distal medial augment.  We also placed a 10- mm augments on the medial and lateral tibial tray.  With those intact and a 20-mm insert, full extension was achieved with a tiny bit of play. The patella was everted and the patellar component was intact, both covered with soft tissue.  Due to patelloplasty was uncovered, the patellar component was not worn.  It tracks normally, so we left it intact.  Tourniquet was then released.  Initial tourniquet time of 28 minutes, it was held down for 8 minutes.  There was minimal bleeding, which was stopped with electrocautery.  The components were then assembled on the back table.  All tourniquets were down.  After 8 minutes, we rewrapped the leg in Esmarch and reinflated the tourniquet to 300 mmHg.  Trial components were removed.  Cement restrictor size 4 was placed into the tibial canal with the appropriate depth.  The bone was then thoroughly irrigated with pulsatile lavage.  Three batches of gentamicin impregnated cements were then mixed.  Once ready for implantation, it was injected into the tibial canal and tibial components cemented into place, impacted and extruded, cement removed.  The size of the tibial component once again, 2.5 MBT revision tray with a 29 sleeve, 13 x 30 stem extension and 10-mm medial and lateral augments.  Femoral side was cemented distally with a press-fit stem.  The size of the femoral component, size 2.5 TC3  femur with a 16 x 75 stem extension in a +2 position, 5-degree left valgus with an 8-mm distal lateral augment, 4-mm distal medial augment.  We placed a 22.5-mm TC3 trial insert.  Full extension was achieved with excellent varus-valgus and anterior- posterior balance throughout full range of motion.  These held in full extension.  When the cement was fully hardened, then the permanent 22.5- mm TC3 rotating platform insert was placed in the tibial tray. Reduction was again performed with excellent stability.  She hyperextends in 20 degrees preop and she was at full extension now with no hyperextension.  The wound was then copiously irrigated with saline solution and the arthrotomy was closed over Hemovac drain with a running #1 V-Loc suture.  Second tourniquet time was released at 19  minutes.  The wound was copiously irrigated with saline solution. Again, subcu was closed with interrupted 2-0 Vicryl, skin with staples. Catheter for Marcaine, pain pumps placed, and pumps initiated.  Incision was cleaned and dried and a bulky sterile dressing applied.  She was awakened and transported to recovery in stable condition.     Gaynelle Arabian, M.D.     FA/MEDQ  D:  08/06/2012  T:  08/07/2012  Job:  [id number]

## 2012-08-07 NOTE — Progress Notes (Signed)
   Subjective: 1 Day Post-Op Procedure(s) (LRB): TOTAL KNEE REVISION (Left) Patient reports pain as mild.   Patient seen in rounds with Dr. Lequita Halt. Patient already got up and walked yesterday, the day of surgery. Patient is well, and has had no acute complaints or problems We will start therapy today.  Plan is to go Skilled nursing facility after hospital stay.  Objective: Vital signs in last 24 hours: Temp:  [94 F (34.4 C)-98.6 F (37 C)] 98.6 F (37 C) (10/31 0550) Pulse Rate:  [59-80] 67  (10/31 0550) Resp:  [11-18] 16  (10/31 0752) BP: (90-128)/(45-68) 106/48 mmHg (10/31 0550) SpO2:  [96 %-100 %] 99 % (10/31 0752) Weight:  [81.647 kg (180 lb)] 81.647 kg (180 lb) (10/30 1312)  Intake/Output from previous day:  Intake/Output Summary (Last 24 hours) at 08/07/12 0915 Last data filed at 08/07/12 0857  Gross per 24 hour  Intake   2230 ml  Output   2295 ml  Net    -65 ml    Intake/Output this shift: Total I/O In: 240 [P.O.:240] Out: -   Labs:  Basename 08/07/12 0435  HGB 10.2*    Basename 08/07/12 0435  WBC 12.9*  RBC 3.36*  HCT 30.0*  PLT 166    Basename 08/07/12 0435  NA 136  K 4.3  CL 103  CO2 26  BUN 11  CREATININE 0.72  GLUCOSE 157*  CALCIUM 7.8*   No results found for this basename: LABPT:2,INR:2 in the last 72 hours  EXAM General - Patient is Alert, Appropriate and Oriented Extremity - Neurovascular intact Sensation intact distally Dorsiflexion/Plantar flexion intact Dressing - dressing C/D/I Motor Function - intact, moving foot and toes well on exam.  Hemovac pulled without difficulty.  Past Medical History  Diagnosis Date  . Hypothyroidism   . Psoriatic arthritis   . IBS (irritable bowel syndrome)   . Hemorrhoids   . Diverticulosis   . Rectal polyp   . GERD (gastroesophageal reflux disease)   . Family hx of colon cancer   . Uterine polyp   . Arthritis   . COPD (chronic obstructive pulmonary disease)   . Lymphocytic colitis      Assessment/Plan: 1 Day Post-Op Procedure(s) (LRB): TOTAL KNEE REVISION (Left) Principal Problem:  *Instability of prosthetic knee  Estimated Body mass index is 29.95 kg/(m^2) as calculated from the following:   Height as of this encounter: 5\' 5" (1.651 m).   Weight as of this encounter: 180 lb(81.647 kg). Advance diet Up with therapy Plan for discharge tomorrow Discharge home with home health  DVT Prophylaxis - Xarelto Weight-Bearing as tolerated to left leg No vaccines. D/C O2 and Pulse OX and try on Room Air  Darril Patriarca 08/07/2012, 9:15 AM

## 2012-08-07 NOTE — Care Management Note (Signed)
    Page 1 of 2   08/07/2012     3:09:56 PM   CARE MANAGEMENT NOTE 08/07/2012  Patient:  ZOXWRU,[id number] S   Account Number:  [id number]  Date Initiated:  08/07/2012  Documentation initiated by:  Colleen Can  Subjective/Objective Assessment:   dx unstable left total knee; revision total knee     Action/Plan:   snf rehab   Anticipated DC Date:  08/09/2012   Anticipated DC Plan:  SKILLED NURSING FACILITY  In-house referral  Clinical Social Worker      DC Planning Services  CM consult      [facility] Choice  NA   Choice offered to / List presented to:  NA   DME arranged  NA      DME agency  NA     HH arranged  NA      HH agency  NA   Status of service:  Completed, signed off Medicare Important Message given?  NA - LOS <3 / Initial given by admissions (If response is "NO", the following Medicare IM given date fields will be blank) Date Medicare IM given:   Date Additional Medicare IM given:    Discharge Disposition:    Per UR Regulation:    If discussed at Long Length of Stay Meetings, dates discussed:    Comments:  08/07/2012 Damaris Schooner RN CCM [phone] CSW REFERRAL; CM SIGNING OFF.

## 2012-08-08 DIAGNOSIS — D62 Acute posthemorrhagic anemia: Secondary | ICD-10-CM

## 2012-08-08 LAB — BASIC METABOLIC PANEL
BUN: 12 mg/dL (ref 6–23)
Calcium: 8.2 mg/dL — ABNORMAL LOW (ref 8.4–10.5)
Chloride: 104 mEq/L (ref 96–112)
Creatinine, Ser: 0.67 mg/dL (ref 0.50–1.10)
GFR calc Af Amer: >90 mL/min (ref >90)

## 2012-08-08 LAB — CBC
HCT: 29.7 % — ABNORMAL LOW (ref 36.0–46.0)
MCH: 30.1 pg (ref 26.0–34.0)
MCHC: 33.3 g/dL (ref 30.0–36.0)
MCV: 90.3 fL (ref 78.0–100.0)
Platelets: 161 10*3/uL (ref 150–400)
RDW: 13.7 % (ref 11.5–15.5)
WBC: 12.9 10*3/uL — ABNORMAL HIGH (ref 4.0–10.5)

## 2012-08-08 MED ORDER — POLYETHYLENE GLYCOL 3350 17 G PO PACK: 17.0000 g | PACK | Freq: Every day | ORAL | Status: DC | PRN

## 2012-08-08 MED ORDER — OXYCODONE HCL 5 MG PO TABS: 5.0000 mg | ORAL_TABLET | ORAL | Status: DC | PRN

## 2012-08-08 MED ORDER — RIVAROXABAN 10 MG PO TABS: 10.0000 mg | ORAL_TABLET | Freq: Every day | ORAL | Status: DC

## 2012-08-08 MED ORDER — DSS 100 MG PO CAPS: 100.0000 mg | ORAL_CAPSULE | Freq: Two times a day (BID) | ORAL | Status: DC

## 2012-08-08 MED ORDER — TRAMADOL HCL 50 MG PO TABS: 50.0000 mg | ORAL_TABLET | Freq: Four times a day (QID) | ORAL | Status: DC | PRN

## 2012-08-08 MED ORDER — ONDANSETRON HCL 4 MG PO TABS: 4.0000 mg | ORAL_TABLET | Freq: Four times a day (QID) | ORAL | Status: DC | PRN

## 2012-08-08 MED ORDER — BISACODYL 10 MG RE SUPP: 10.0000 mg | Freq: Every day | RECTAL | Status: DC | PRN

## 2012-08-08 MED ORDER — ACETAMINOPHEN 325 MG PO TABS: 650.0000 mg | ORAL_TABLET | Freq: Four times a day (QID) | ORAL | Status: DC | PRN

## 2012-08-08 MED ORDER — METHOCARBAMOL 500 MG PO TABS: 500.0000 mg | ORAL_TABLET | Freq: Four times a day (QID) | ORAL | Status: DC | PRN

## 2012-08-08 NOTE — Progress Notes (Signed)
CSW assisting with d/c planning. If stable, pt will be d/c to [facility] Saturday. Tentative d/c summary has been sent to [facility] and CSW has confirmed it has been received. Week end CSW will assist with d/c planning to SNF.  Cori Razor LCSW [phone]

## 2012-08-08 NOTE — Discharge Summary (Signed)
Physician Discharge Summary   Patient ID: [name] MRN: [id number] DOB/AGE: 16-Mar-1933 78 y.o.  Admit date: 08/06/2012 Discharge date: Tentative Date of Discharge - Saturday 08/09/2012  Primary Diagnosis: Unstable left total knee arthroplasty.  Admission Diagnoses:  Past Medical History  Diagnosis Date  . Hypothyroidism   . Psoriatic arthritis   . IBS (irritable bowel syndrome)   . Hemorrhoids   . Diverticulosis   . Rectal polyp   . GERD (gastroesophageal reflux disease)   . Family hx of colon cancer   . Uterine polyp   . Arthritis   . COPD (chronic obstructive pulmonary disease)   . Lymphocytic colitis    Discharge Diagnoses:   Principal Problem:  *Instability of prosthetic knee Active Problems:  Postop Acute blood loss anemia  Estimated Body mass index is 29.95 kg/(m^2) as calculated from the following:   Height as of this encounter: 5\' 5" (1.651 m).   Weight as of this encounter: 180 lb(81.647 kg).  Classification of overweight in adults according to BMI (WHO, 1998)   Procedure:  Procedure(s) (LRB): TOTAL KNEE REVISION (Left)   Consults: None  HPI: Ms. Kolker is a 78 year old female who has a  significantly painful unstable left total knee arthroplasty. Workup was  negative for infection. She did better when she was wearing a brace  confirming that the pain was due to the instability. The knee was  grossly unstable and she presents now for revision total knee  arthroplasty.  Laboratory Data: Admission on 08/06/2012  Component Date Value Range Status  . ABO/RH(D) 08/06/2012 O POS   Final  . Antibody Screen 08/06/2012 NEG   Final  . Sample Expiration 08/06/2012 08/09/2012   Final  . ABO/RH(D) 08/06/2012 O POS   Final  . WBC 08/07/2012 12.9* 4.0 - 10.5 K/uL Final  . RBC 08/07/2012 3.36* 3.87 - 5.11 MIL/uL Final  . Hemoglobin 08/07/2012 10.2* 12.0 - 15.0 g/dL Final  . HCT 82/95/6213 30.0* 36.0 - 46.0 % Final  . MCV 08/07/2012 89.3  78.0 - 100.0 fL  Final  . MCH 08/07/2012 30.4  26.0 - 34.0 pg Final  . MCHC 08/07/2012 34.0  30.0 - 36.0 g/dL Final  . RDW 08/65/7846 13.3  11.5 - 15.5 % Final  . Platelets 08/07/2012 166  150 - 400 K/uL Final  . Sodium 08/07/2012 136  135 - 145 mEq/L Final  . Potassium 08/07/2012 4.3  3.5 - 5.1 mEq/L Final  . Chloride 08/07/2012 103  96 - 112 mEq/L Final  . CO2 08/07/2012 26  19 - 32 mEq/L Final  . Glucose, Bld 08/07/2012 157* 70 - 99 mg/dL Final  . BUN 96/29/5284 11  6 - 23 mg/dL Final  . Creatinine, Ser 08/07/2012 0.72  0.50 - 1.10 mg/dL Final  . Calcium 13/24/4010 7.8* 8.4 - 10.5 mg/dL Final  . GFR calc non Af Amer 08/07/2012 81* >90 mL/min Final  . GFR calc Af Amer 08/07/2012 >90  >90 mL/min Final   Comment:                                 The eGFR has been calculated                          using the CKD EPI equation.  This calculation has not been                          validated in all clinical                          situations.                          eGFR's persistently                          <90 mL/min signify                          possible Chronic Kidney Disease.  . WBC 08/08/2012 12.9* 4.0 - 10.5 K/uL Final  . RBC 08/08/2012 3.29* 3.87 - 5.11 MIL/uL Final  . Hemoglobin 08/08/2012 9.9* 12.0 - 15.0 g/dL Final  . HCT 62/95/2841 29.7* 36.0 - 46.0 % Final  . MCV 08/08/2012 90.3  78.0 - 100.0 fL Final  . MCH 08/08/2012 30.1  26.0 - 34.0 pg Final  . MCHC 08/08/2012 33.3  30.0 - 36.0 g/dL Final  . RDW 32/44/0102 13.7  11.5 - 15.5 % Final  . Platelets 08/08/2012 161  150 - 400 K/uL Final  . Sodium 08/08/2012 137  135 - 145 mEq/L Final  . Potassium 08/08/2012 3.9  3.5 - 5.1 mEq/L Final  . Chloride 08/08/2012 104  96 - 112 mEq/L Final  . CO2 08/08/2012 26  19 - 32 mEq/L Final  . Glucose, Bld 08/08/2012 172* 70 - 99 mg/dL Final  . BUN 72/53/6644 12  6 - 23 mg/dL Final  . Creatinine, Ser 08/08/2012 0.67  0.50 - 1.10 mg/dL Final  . Calcium 03/47/4259 8.2* 8.4  - 10.5 mg/dL Final  . GFR calc non Af Amer 08/08/2012 83* >90 mL/min Final  . GFR calc Af Amer 08/08/2012 >90  >90 mL/min Final   Comment:                                 The eGFR has been calculated                          using the CKD EPI equation.                          This calculation has not been                          validated in all clinical                          situations.                          eGFR's persistently                          <90 mL/min signify                          possible Chronic Kidney Disease.  Hospital Outpatient Visit on 07/31/2012  Component Date  Value Range Status  . aPTT 07/31/2012 30  24 - 37 seconds Final  . WBC 07/31/2012 7.9  4.0 - 10.5 K/uL Final  . RBC 07/31/2012 5.01  3.87 - 5.11 MIL/uL Final  . Hemoglobin 07/31/2012 15.4* 12.0 - 15.0 g/dL Final  . HCT 16/07/9603 45.1  36.0 - 46.0 % Final  . MCV 07/31/2012 90.0  78.0 - 100.0 fL Final  . MCH 07/31/2012 30.7  26.0 - 34.0 pg Final  . MCHC 07/31/2012 34.1  30.0 - 36.0 g/dL Final  . RDW 54/06/8118 13.1  11.5 - 15.5 % Final  . Platelets 07/31/2012 223  150 - 400 K/uL Final  . Sodium 07/31/2012 135  135 - 145 mEq/L Final  . Potassium 07/31/2012 5.5* 3.5 - 5.1 mEq/L Final  . Chloride 07/31/2012 99  96 - 112 mEq/L Final  . CO2 07/31/2012 29  19 - 32 mEq/L Final  . Glucose, Bld 07/31/2012 115* 70 - 99 mg/dL Final  . BUN 14/78/2956 14  6 - 23 mg/dL Final  . Creatinine, Ser 07/31/2012 0.86  0.50 - 1.10 mg/dL Final  . Calcium 21/30/8657 9.3  8.4 - 10.5 mg/dL Final  . Total Protein 07/31/2012 7.8  6.0 - 8.3 g/dL Final  . Albumin 84/69/6295 3.9  3.5 - 5.2 g/dL Final  . AST 28/41/3244 21  0 - 37 U/L Final  . ALT 07/31/2012 14  0 - 35 U/L Final  . Alkaline Phosphatase 07/31/2012 81  39 - 117 U/L Final  . Total Bilirubin 07/31/2012 0.6  0.3 - 1.2 mg/dL Final  . GFR calc non Af Amer 07/31/2012 64* >90 mL/min Final  . GFR calc Af Amer 07/31/2012 74* >90 mL/min Final   Comment:                                  The eGFR has been calculated                          using the CKD EPI equation.                          This calculation has not been                          validated in all clinical                          situations.                          eGFR's persistently                          <90 mL/min signify                          possible Chronic Kidney Disease.  Marland Kitchen Prothrombin Time 07/31/2012 12.9  11.6 - 15.2 seconds Final  . INR 07/31/2012 0.98  0.00 - 1.49 Final  . Color, Urine 07/31/2012 YELLOW  YELLOW Final  . APPearance 07/31/2012 CLEAR  CLEAR Final  . Specific Gravity, Urine 07/31/2012 1.022  1.005 - 1.030 Final  . pH 07/31/2012 5.5  5.0 - 8.0 Final  . Glucose, UA 07/31/2012 NEGATIVE  NEGATIVE mg/dL Final  . Hgb urine dipstick 07/31/2012 SMALL* NEGATIVE Final  . Bilirubin Urine 07/31/2012 NEGATIVE  NEGATIVE Final  . Ketones, ur 07/31/2012 NEGATIVE  NEGATIVE mg/dL Final  . Protein, ur 16/07/9603 NEGATIVE  NEGATIVE mg/dL Final  . Urobilinogen, UA 07/31/2012 0.2  0.0 - 1.0 mg/dL Final  . Nitrite 54/06/8118 NEGATIVE  NEGATIVE Final  . Leukocytes, UA 07/31/2012 NEGATIVE  NEGATIVE Final  . MRSA, PCR 07/31/2012 NEGATIVE  NEGATIVE Final  . Staphylococcus aureus 07/31/2012 NEGATIVE  NEGATIVE Final   Comment:                                 The Xpert SA Assay (FDA                          approved for NASAL specimens                          in patients over 88 years of age),                          is one component of                          a comprehensive surveillance                          program.  Test performance has                          been validated by [employer] for patients greater                          than or equal to 55 year old.                          It is not intended                          to diagnose infection nor to                          guide or monitor treatment.  . Squamous Epithelial / LPF  07/31/2012 RARE  RARE Final  . WBC, UA 07/31/2012 0-2  <3 WBC/hpf Final  . RBC / HPF 07/31/2012 0-2  <3 RBC/hpf Final     X-Rays:Dg Chest 2 View  07/31/2012  *RADIOLOGY REPORT*  Clinical Data: Preop  CHEST - 2 VIEW  Comparison: 11/22/2009  Findings: Cardiomediastinal silhouette is stable.  Hyperinflation again noted.  Stable mild degenerative changes thoracic spine.  No acute infiltrate or pulmonary edema.  IMPRESSION: No active disease.  Hyperinflation again noted.   Original Report Authenticated By: Natasha Mead, M.D.     EKG:No orders found for this or any previous visit.   Hospital Course:  TELISA OHLSEN is a 78 y.o. who was admitted to [facility]. They were brought to the operating room on 08/06/2012 and underwent Procedure(s): TOTAL KNEE  REVISION.  Patient tolerated the procedure well and was later transferred to the recovery room and then to the orthopaedic floor for postoperative care.  They were given PO and IV analgesics for pain control following their surgery.  They were given 24 hours of postoperative antibiotics of  Anti-infectives     Start     Dose/Rate Route Frequency Ordered Stop   08/06/12 1500   ceFAZolin (ANCEF) IVPB 1 g/50 mL premix        1 g 100 mL/hr over 30 Minutes Intravenous Every 6 hours 08/06/12 1317 08/06/12 2216   08/06/12 0517   ceFAZolin (ANCEF) 3 g in dextrose 5 % 50 mL IVPB        3 g 160 mL/hr over 30 Minutes Intravenous 60 min pre-op 08/06/12 0517 08/06/12 0706         and started on DVT prophylaxis in the form of Xarelto.   PT and OT were ordered for total joint protocol.  Discharge planning consulted to help with postop disposition and equipment needs. She was doing well following surgery and actually walked on the day of surgery down the hall  Patient had a good night on the evening of surgery and started to get up OOB with therapy again on day one walking 120 feet.  Hemovac drain was pulled without difficulty.  Continued to work with  therapy into day two and progressed well.  Dressing was changed on day two and the incision was healing well.  Patient was seen in rounds and was planning to go to the SNF the next day, Saturday.  Arrangements were being made for the patient to go to [facility].  The patient will be evaluated by the weekend coverage staff, and as long as she is doing well, the will be transferred over on Saturday 08/09/2012.   Discharge Medications: Prior to Admission medications   Medication Sig Start Date End Date Taking? Authorizing Provider  ALPRAZolam Prudy Feeler) 0.5 MG tablet Take 0.25-0.5 mg by mouth at bedtime as needed. Sleep 02/15/12  Yes Historical Provider, MD  cetirizine (ZYRTEC) 10 MG tablet Take 10 mg by mouth daily. 06/17/12 06/17/13 Yes Leslye Peer, MD  diphenoxylate-atropine (LOMOTIL) 2.5-0.025 MG per tablet Take 1-2 tablets by mouth 3 (three) times daily as needed. diarrhea 06/19/11  Yes Hilarie Fredrickson, MD  levalbuterol [facility] HFA) 45 MCG/ACT inhaler Inhale 1-2 puffs into the lungs every 4 (four) hours as needed. Wheezing   Yes Historical Provider, MD  levothyroxine (SYNTHROID, LEVOTHROID) 112 MCG tablet Take 112 mcg by mouth daily before breakfast.    Yes Historical Provider, MD  methylcellulose (ARTIFICIAL TEARS) 1 % ophthalmic solution Place 1 drop into both eyes 2 (two) times daily as needed. Dry eyes   Yes Historical Provider, MD  omeprazole (PRILOSEC) 20 MG capsule Take 20 mg by mouth 2 (two) times daily. 02/14/12 02/13/13 Yes Meredith Pel, NP  tiotropium (SPIRIVA) 18 MCG inhalation capsule Place 18 mcg into inhaler and inhale daily. 12/18/11 12/17/12 Yes Leslye Peer, MD  acetaminophen (TYLENOL) 325 MG tablet Take 2 tablets (650 mg total) by mouth every 6 (six) hours as needed (or Fever >/= 101). 08/08/12   Emma Schupp Julien Girt, PA  betamethasone dipropionate (DIPROLENE) 0.05 % ointment Apply 1 application topically 2 (two) times daily.    Historical Provider, MD  bisacodyl (DULCOLAX) 10 MG  suppository Place 1 suppository (10 mg total) rectally daily as needed. 08/08/12   Charday Capetillo, PA  docusate sodium 100 MG CAPS Take  100 mg by mouth 2 (two) times daily. 08/08/12   Alakai Macbride Julien Girt, PA  methocarbamol (ROBAXIN) 500 MG tablet Take 1 tablet (500 mg total) by mouth every 6 (six) hours as needed. 08/08/12   Cyann Venti, PA  ondansetron (ZOFRAN) 4 MG tablet Take 1 tablet (4 mg total) by mouth every 6 (six) hours as needed for nausea. 08/08/12   Darlena Koval, PA  oxyCODONE (OXY IR/ROXICODONE) 5 MG immediate release tablet Take 1-2 tablets (5-10 mg total) by mouth every 3 (three) hours as needed. 08/08/12   Jaimen Melone, PA  polyethylene glycol (MIRALAX / GLYCOLAX) packet Take 17 g by mouth daily as needed. 08/08/12   Lilianna Case Julien Girt, PA  rivaroxaban (XARELTO) 10 MG TABS tablet Take 1 tablet (10 mg total) by mouth daily with breakfast. Take Xarelto for two and a half more weeks, then discontinue Xarelto. 08/08/12   Naijah Lacek Julien Girt, PA  traMADol (ULTRAM) 50 MG tablet Take 1-2 tablets (50-100 mg total) by mouth every 6 (six) hours as needed (mild pain). 08/08/12   Devynne Sturdivant Julien Girt, PA    Diet: Regular diet Activity:WBAT Follow-up:in 2 weeks Disposition - Skilled nursing facility Discharged Condition: Pending at time of summary.  Plan for Saturday if patient doing well.   Discharge Orders    Future Orders Please Complete By Expires   Diet general      Call MD / Call 911      Comments:   If you experience chest pain or shortness of breath, CALL 911 and be transported to the hospital emergency room.  If you develope a fever above 101 F, pus (white drainage) or increased drainage or redness at the wound, or calf pain, call your surgeon's office.   Discharge instructions      Comments:   Pick up stool softner and laxative for home. Do not submerge incision under water. May shower. Continue to use ice for pain and swelling from surgery.  Take Xarelto  for two and a half more weeks, then discontinue Xarelto.  When discharged from the skilled rehab facility, please have the facility set up the patient's Home Health Physical Therapy prior to being released.  Also provide the patient with their medications at time of release from the facility to include their pain medication, the muscle relaxants, and their blood thinner medication.  If the patient is still at the rehab facility at time of follow up appointment, please also assist the patient in arranging follow up appointment in our office and any transportation needs.   Constipation Prevention      Comments:   Drink plenty of fluids.  Prune juice may be helpful.  You may use a stool softener, such as Colace (over the counter) 100 mg twice a day.  Use MiraLax (over the counter) for constipation as needed.   Increase activity slowly as tolerated      Patient may shower      Comments:   You may shower without a dressing once there is no drainage.  Do not wash over the wound.  If drainage remains, do not shower until drainage stops.   Weight bearing as tolerated      Driving restrictions      Comments:   No driving until released by the physician.   Lifting restrictions      Comments:   No lifting until released by the physician.   TED hose      Comments:   Use stockings (TED hose) for 3 weeks on both leg(s).  You may remove them at night for sleeping.   Change dressing      Comments:   Change dressing daily with sterile 4 x 4 inch gauze dressing and apply TED hose. Do not submerge the incision under water.   Do not put a pillow under the knee. Place it under the heel.      Do not sit on low chairs, stoools or toilet seats, as it may be difficult to get up from low surfaces          Medication List     As of 08/08/2012  8:56 AM    STOP taking these medications         calcium carbonate 500 MG chewable tablet   Commonly known as: TUMS - dosed in mg elemental calcium      cefdinir 300  MG capsule   Commonly known as: OMNICEF      fluconazole 150 MG tablet   Commonly known as: DIFLUCAN      TAKE these medications         acetaminophen 325 MG tablet   Commonly known as: TYLENOL   Take 2 tablets (650 mg total) by mouth every 6 (six) hours as needed (or Fever >/= 101).      ALPRAZolam 0.5 MG tablet   Commonly known as: XANAX   Take 0.25-0.5 mg by mouth at bedtime as needed. Sleep      betamethasone dipropionate 0.05 % ointment   Commonly known as: DIPROLENE   Apply 1 application topically 2 (two) times daily.      bisacodyl 10 MG suppository   Commonly known as: DULCOLAX   Place 1 suppository (10 mg total) rectally daily as needed.      cetirizine 10 MG tablet   Commonly known as: ZYRTEC   Take 10 mg by mouth daily.      diphenoxylate-atropine 2.5-0.025 MG per tablet   Commonly known as: LOMOTIL   Take 1-2 tablets by mouth 3 (three) times daily as needed. diarrhea      DSS 100 MG Caps   Take 100 mg by mouth 2 (two) times daily.      levothyroxine 112 MCG tablet   Commonly known as: SYNTHROID, LEVOTHROID   Take 112 mcg by mouth daily before breakfast.      methocarbamol 500 MG tablet   Commonly known as: ROBAXIN   Take 1 tablet (500 mg total) by mouth every 6 (six) hours as needed.      methylcellulose 1 % ophthalmic solution   Commonly known as: ARTIFICIAL TEARS   Place 1 drop into both eyes 2 (two) times daily as needed. Dry eyes      omeprazole 20 MG capsule   Commonly known as: PRILOSEC   Take 20 mg by mouth 2 (two) times daily.      ondansetron 4 MG tablet   Commonly known as: ZOFRAN   Take 1 tablet (4 mg total) by mouth every 6 (six) hours as needed for nausea.      oxyCODONE 5 MG immediate release tablet   Commonly known as: Oxy IR/ROXICODONE   Take 1-2 tablets (5-10 mg total) by mouth every 3 (three) hours as needed.      polyethylene glycol packet   Commonly known as: MIRALAX / GLYCOLAX   Take 17 g by mouth daily as needed.       rivaroxaban 10 MG Tabs tablet   Commonly known as: XARELTO   Take 1 tablet (10 mg  total) by mouth daily with breakfast. Take Xarelto for two and a half more weeks, then discontinue Xarelto.      tiotropium 18 MCG inhalation capsule   Commonly known as: SPIRIVA   Place 18 mcg into inhaler and inhale daily.      traMADol 50 MG tablet   Commonly known as: ULTRAM   Take 1-2 tablets (50-100 mg total) by mouth every 6 (six) hours as needed (mild pain).      XOPENEX HFA 45 MCG/ACT inhaler   Generic drug: levalbuterol   Inhale 1-2 puffs into the lungs every 4 (four) hours as needed. Wheezing           Follow-up Information    Follow up with Loanne Drilling, MD. Schedule an appointment as soon as possible for a visit in 2 weeks.   Contact information:   [address] [place] [address] [phone]          Signed: Patrica Duel 08/08/2012, 8:56 AM

## 2012-08-08 NOTE — Progress Notes (Signed)
   Subjective: 2 Days Post-Op Procedure(s) (LRB): TOTAL KNEE REVISION (Left) Patient reports pain as mild.   Patient seen in rounds with Dr. Lequita Halt. Patient is well, and has had no acute complaints or problems Plan is to go Skilled nursing facility after hospital stay.  Objective: Vital signs in last 24 hours: Temp:  [97.6 F (36.4 C)-98.1 F (36.7 C)] 98.1 F (36.7 C) (11/01 0528) Pulse Rate:  [66-86] 86  (11/01 0528) Resp:  [12-18] 16  (11/01 0528) BP: (103-110)/(50-64) 106/60 mmHg (11/01 0528) SpO2:  [92 %-97 %] 95 % (11/01 0528)  Intake/Output from previous day:  Intake/Output Summary (Last 24 hours) at 08/08/12 0847 Last data filed at 08/08/12 0500  Gross per 24 hour  Intake    420 ml  Output   3150 ml  Net  -2730 ml    Intake/Output this shift:    Labs:  Basename 08/08/12 0420 08/07/12 0435  HGB 9.9* 10.2*    Basename 08/08/12 0420 08/07/12 0435  WBC 12.9* 12.9*  RBC 3.29* 3.36*  HCT 29.7* 30.0*  PLT 161 166    Basename 08/08/12 0420 08/07/12 0435  NA 137 136  K 3.9 4.3  CL 104 103  CO2 26 26  BUN 12 11  CREATININE 0.67 0.72  GLUCOSE 172* 157*  CALCIUM 8.2* 7.8*   No results found for this basename: LABPT:2,INR:2 in the last 72 hours  EXAM General - Patient is Alert, Appropriate and Oriented Extremity - Neurovascular intact Sensation intact distally Dorsiflexion/Plantar flexion intact No cellulitis present Dressing/Incision - clean, dry, no drainage, healing Motor Function - intact, moving foot and toes well on exam.   Past Medical History  Diagnosis Date  . Hypothyroidism   . Psoriatic arthritis   . IBS (irritable bowel syndrome)   . Hemorrhoids   . Diverticulosis   . Rectal polyp   . GERD (gastroesophageal reflux disease)   . Family hx of colon cancer   . Uterine polyp   . Arthritis   . COPD (chronic obstructive pulmonary disease)   . Lymphocytic colitis     Assessment/Plan: 2 Days Post-Op Procedure(s) (LRB): TOTAL KNEE  REVISION (Left) Principal Problem:  *Instability of prosthetic knee Active Problems:  Postop Acute blood loss anemia  Estimated Body mass index is 29.95 kg/(m^2) as calculated from the following:   Height as of this encounter: 5\' 5" (1.651 m).   Weight as of this encounter: 180 lb(81.647 kg). Up with therapy Plan for discharge tomorrow Discharge to SNF  DVT Prophylaxis - Xarelto Weight-Bearing as tolerated to left leg  [name] 08/08/2012, 8:47 AM

## 2012-08-08 NOTE — Progress Notes (Signed)
Physical Therapy Treatment Note   08/08/12 1500  PT Visit Information  Last PT Received On 08/08/12  Assistance Needed +1  PT Time Calculation  PT Start Time 1443  PT Stop Time 1454  PT Time Calculation (min) 11 min  Subjective Data  Subjective I'm a little slower this afternoon for some reason.  Precautions  Precautions Knee  Restrictions  LLE Weight Bearing WBAT  Cognition  Overall Cognitive Status Appears within functional limits for tasks assessed/performed  Bed Mobility  Bed Mobility Supine to Sit;Sit to Supine  Supine to Sit 4: Min assist  Sit to Supine 4: Min assist  Details for Bed Mobility Assistance increased time, pt with trouble lifting L LE this afternoon due to pain so assisted  Transfers  Transfers Stand to Sit;Sit to Stand  Sit to Stand 5: Supervision;With upper extremity assist;From bed  Stand to Sit 5: Supervision;With upper extremity assist;To bed  Details for Transfer Assistance no cues required  Ambulation/Gait  Ambulation/Gait Assistance 5: Supervision  Ambulation Distance (Feet) 120 Feet  Assistive device Rolling walker  Ambulation/Gait Assistance Details pt moving a little slower this afternoon, verbal cues for looking upright  Gait Pattern Step-to pattern;Decreased stance time - left;Antalgic  Gait velocity decreased  PT - End of Session  Activity Tolerance Patient tolerated treatment well  Patient left in bed;with call bell/phone within reach  PT - Assessment/Plan  Comments on Treatment Session Pt premedicated just prior to therapy and reporting 4/10 pain in L knee.  Pt ambulated in hallway and then ortho tech into room to apply CPM.  PT Plan Discharge plan remains appropriate;Frequency remains appropriate  Follow Up Recommendations Post acute inpatient  Does the patient have the potential to tolerate intense rehabilitation? No, Recommend SNF  Equipment Recommended None recommended by PT  Acute Rehab PT Goals  PT Goal: Supine/Side to Sit -  Progress Progressing toward goal  PT Goal: Sit to Stand - Progress Progressing toward goal  PT Goal: Stand to Sit - Progress Progressing toward goal  PT Goal: Ambulate - Progress Progressing toward goal  PT General Charges  $$ ACUTE PT VISIT 1 Procedure  PT Treatments  $Gait Training 8-22 mins    Zenovia Jarred, PT Pager: [phone]

## 2012-08-08 NOTE — Progress Notes (Signed)
Physical Therapy Treatment Patient Details Name: SHAWNDA MAUNEY MRN: [id number] DOB: [birth year] Today's Date: 08/08/2012 Time: 4098-1191 PT Time Calculation (min): 27 min  PT Assessment / Plan / Recommendation Comments on Treatment Session  Pt doing well with mobility and performed exercises.  Pt to d/c to SNF likely today.    Follow Up Recommendations  Post acute inpatient     Does the patient have the potential to tolerate intense rehabilitation  No, Recommend SNF  Barriers to Discharge        Equipment Recommendations  None recommended by PT    Recommendations for Other Services    Frequency     Plan Discharge plan remains appropriate;Frequency remains appropriate    Precautions / Restrictions Precautions Precautions: Knee Restrictions LLE Weight Bearing: Weight bearing as tolerated   Pertinent Vitals/Pain Pt reports no pain but only sore L knee, ice applied    Mobility  Transfers Transfers: Stand to Sit;Sit to Stand Sit to Stand: 5: Supervision;With upper extremity assist;From chair/3-in-1 Stand to Sit: 5: Supervision;With upper extremity assist;To chair/3-in-1 Details for Transfer Assistance: no cues required Ambulation/Gait Ambulation/Gait Assistance: 5: Supervision Ambulation Distance (Feet): 160 Feet Assistive device: Rolling walker Ambulation/Gait Assistance Details: verbal cue for posture Gait Pattern: Step-to pattern;Decreased stance time - left;Antalgic Gait velocity: decreased    Exercises Total Joint Exercises Ankle Circles/Pumps: AROM;Both;10 reps Quad Sets: AROM;Both;10 reps Gluteal Sets: AROM;Both;10 reps Towel Squeeze: AROM;Both;10 reps Short Arc Quad: AROM;Left;10 reps Heel Slides: AROM;Left;10 reps;Seated Hip ABduction/ADduction: AROM;Left;10 reps Straight Leg Raises: 10 reps;AROM;Left   PT Diagnosis:    PT Problem List:   PT Treatment Interventions:     PT Goals Acute Rehab PT Goals PT Goal: Sit to Stand - Progress: Progressing  toward goal PT Goal: Stand to Sit - Progress: Progressing toward goal PT Goal: Ambulate - Progress: Progressing toward goal PT Goal: Perform Home Exercise Program - Progress: Progressing toward goal  Visit Information  Last PT Received On: 08/08/12 Assistance Needed: +1    Subjective Data  Subjective: My sister brought me that light up pumpkin.   Cognition  Overall Cognitive Status: Appears within functional limits for tasks assessed/performed    Balance  Balance Balance Assessed: Yes Dynamic Standing Balance Dynamic Standing - Balance Support: Left upper extremity supported;During functional activity Dynamic Standing - Level of Assistance: 5: Stand by assistance Dynamic Standing - Comments: pt stood at sink to brush teeth  End of Session PT - End of Session Activity Tolerance: Patient tolerated treatment well Patient left: in chair;with call bell/phone within reach   GP     [facility] 08/08/2012, 11:51 AM Pager: [phone]

## 2012-08-09 LAB — CBC
HCT: 30.6 % — ABNORMAL LOW (ref 36.0–46.0)
Hemoglobin: 10.2 g/dL — ABNORMAL LOW (ref 12.0–15.0)
MCHC: 33.3 g/dL (ref 30.0–36.0)
MCV: 90 fL (ref 78.0–100.0)
RDW: 13.8 % (ref 11.5–15.5)
WBC: 11.9 10*3/uL — ABNORMAL HIGH (ref 4.0–10.5)

## 2012-08-09 MED ORDER — RIVAROXABAN 10 MG PO TABS: 10.0000 mg | ORAL_TABLET | Freq: Every day | ORAL | Status: DC

## 2012-08-09 MED ORDER — OXYCODONE HCL 5 MG PO TABS: 5.0000 mg | ORAL_TABLET | ORAL | Status: DC | PRN

## 2012-08-09 MED ORDER — METHOCARBAMOL 500 MG PO TABS: 500.0000 mg | ORAL_TABLET | Freq: Four times a day (QID) | ORAL | Status: DC | PRN

## 2012-08-09 NOTE — Progress Notes (Signed)
Discharged from floor via w/c, family with pt & to take to [facility]. No changes in assessment. Trevion Hoben, [employer]

## 2012-08-09 NOTE — Progress Notes (Signed)
Pt to be d/c today to [facility].   Pt and family agreeable. Confirmed plans with facility.  Plan transfer via sister .   Leron Croak, LCSWA [employer] [phone]

## 2012-08-09 NOTE — Progress Notes (Addendum)
Physical Therapy Treatment Patient Details Name: SHAWNE BULOW MRN: [id number] DOB: [birth year] Today's Date: 08/09/2012 Time: 1191-4782 PT Time Calculation (min): 25 min  PT Assessment / Plan / Recommendation Comments on Treatment Session  Plans to d/c SNF. Pt feeling a little fatigued today.     Follow Up Recommendations  Post acute inpatient     Does the patient have the potential to tolerate intense rehabilitation  No, Recommend SNF  Barriers to Discharge        Equipment Recommendations  None recommended by PT    Recommendations for Other Services    Frequency 7X/week   Plan Discharge plan remains appropriate    Precautions / Restrictions Precautions Precautions: Knee Precaution Comments: KI discontinued on 08/07/12 Restrictions Weight Bearing Restrictions: No LLE Weight Bearing: Weight bearing as tolerated   Pertinent Vitals/Pain BP: 114/66, 112 bpm, 96% RA    Mobility  Bed Mobility Bed Mobility: Supine to Sit Supine to Sit: 4: Min assist Details for Bed Mobility Assistance: Assist for L LE off bed.  Transfers Transfers: Sit to Stand;Stand to Sit Sit to Stand: 4: Min guard;From bed Stand to Sit: 4: Min guard;To chair/3-in-1;With armrests Details for Transfer Assistance: VCs safety, technique, hand placement.  Ambulation/Gait Ambulation Distance (Feet): 75 Feet Assistive device: Rolling walker Ambulation/Gait Assistance Details: VCs safety. Slow gait speed. Pt c/o feeling like she was gonna "give out"/"hot".  Decreased distance this session.  Gait Pattern: Step-to pattern;Antalgic    Exercises Total Joint Exercises Ankle Circles/Pumps: AROM;Both;10 reps;Supine Quad Sets: AROM;Both;10 reps;Supine Short Arc Quad: AROM;Left;10 reps;Supine Heel Slides: AROM;Left;10 reps;Supine Hip ABduction/ADduction: AROM;Left;10 reps;Supine Straight Leg Raises: AAROM;Left;10 reps;Supine   PT Diagnosis:    PT Problem List:   PT Treatment Interventions:     PT  Goals Acute Rehab PT Goals Pt will go Supine/Side to Sit: with modified independence PT Goal: Supine/Side to Sit - Progress: Progressing toward goal Pt will go Sit to Stand: with modified independence PT Goal: Sit to Stand - Progress: Progressing toward goal Pt will go Stand to Sit: with modified independence PT Goal: Stand to Sit - Progress: Progressing toward goal Pt will Ambulate: 51 - 150 feet;with modified independence;with least restrictive assistive device PT Goal: Ambulate - Progress: Progressing toward goal Pt will Perform Home Exercise Program: with supervision, verbal cues required/provided PT Goal: Perform Home Exercise Program - Progress: Progressing toward goal  Visit Information  Last PT Received On: 08/09/12 Assistance Needed: +1    Subjective Data  Subjective: "I feel Iike I just wanna give out" Patient Stated Goal: Rehab today   Cognition  Overall Cognitive Status: Appears within functional limits for tasks assessed/performed Arousal/Alertness: Awake/alert Orientation Level: Appears intact for tasks assessed Behavior During Session: [facility] for tasks performed    Balance     End of Session PT - End of Session Activity Tolerance: Patient tolerated treatment well Patient left: in chair;with call bell/phone within reach CPM Left Knee CPM Left Knee: Off   GP     Rebeca Alert [facility] 08/09/2012, 10:59 AM [phone]

## 2012-08-09 NOTE — Plan of Care (Signed)
Problem: Discharge Progression Outcomes Goal: Negotiates stairs Outcome: Not Applicable Date Met:  08/09/12 SNF

## 2012-08-09 NOTE — Plan of Care (Signed)
Problem: Discharge Progression Outcomes Goal: Other Discharge Outcomes/Goals Outcome: Completed/Met Date Met:  08/09/12 SNF

## 2012-08-09 NOTE — Progress Notes (Signed)
Subjective: Doing very well today. Waiting on transfer.   Objective: Vital signs in last 24 hours: Temp:  [98 F (36.7 C)-99.6 F (37.6 C)] 98.4 F (36.9 C) (11/02 0440) Pulse Rate:  [84-92] 84  (11/02 0440) Resp:  [16] 16  (11/02 0440) BP: (105-123)/(57-71) 116/65 mmHg (11/02 0440) SpO2:  [94 %-96 %] 95 % (11/02 0440)  Intake/Output from previous day: 11/01 0701 - 11/02 0700 In: 700 [P.O.:700] Out: 750 [Urine:750] Intake/Output this shift: Total I/O In: 120 [P.O.:120] Out: -    Basename 08/09/12 0527 08/08/12 0420 08/07/12 0435  HGB 10.2* 9.9* 10.2*    Basename 08/09/12 0527 08/08/12 0420  WBC 11.9* 12.9*  RBC 3.40* 3.29*  HCT 30.6* 29.7*  PLT 143* 161    Basename 08/08/12 0420 08/07/12 0435  NA 137 136  K 3.9 4.3  CL 104 103  CO2 26 26  BUN 12 11  CREATININE 0.67 0.72  GLUCOSE 172* 157*  CALCIUM 8.2* 7.8*   No results found for this basename: LABPT:2,INR:2 in the last 72 hours  Neurologically intact  Assessment/Plan: SNF today.   Kealey Kemmer A 08/09/2012, 9:03 AM

## 2012-08-11 NOTE — Progress Notes (Signed)
Clinical Social Work Department CLINICAL SOCIAL WORK PLACEMENT NOTE 08/11/2012  Patient:  [name], [name]  Account Number:  [id number] Admit date:  08/06/2012  Clinical Social Worker:  Cori Razor, LCSW  Date/time:  08/07/2012 01:06 PM  Clinical Social Work is seeking post-discharge placement for this patient at the following level of care:   SKILLED NURSING   (*CSW will update this form in Epic as items are completed)     Patient/family provided with [facility] Department of Clinical Social Work's list of facilities offering this level of care within the geographic area requested by the patient (or if unable, by the patient's family).    Patient/family informed of their freedom to choose among providers that offer the needed level of care, that participate in Medicare, Medicaid or managed care program needed by the patient, have an available bed and are willing to accept the patient.    Patient/family informed of [facility]' ownership interest in [facility], as well as of the fact that they are under no obligation to receive care at this facility.  PASARR submitted to EDS on 08/07/2012 PASARR number received from EDS on 08/07/2012  FL2 transmitted to all facilities in geographic area requested by pt/family on  08/07/2012 FL2 transmitted to all facilities within larger geographic area on   Patient informed that his/her managed care company has contracts with or will negotiate with  certain facilities, including the following:     Patient/family informed of bed offers received:  08/08/2012 Patient chooses bed at [facility] Physician recommends and patient chooses bed at    Patient to be transferred to [facility] on  08/09/2012 Patient to be transferred to facility by EMS  The following physician request were entered in Epic:   Additional Comments:  Cori Razor LCSW [phone]

## 2012-09-12 ENCOUNTER — Encounter (HOSPITAL_COMMUNITY): Payer: Self-pay | Admitting: *Deleted

## 2012-09-12 ENCOUNTER — Emergency Department (HOSPITAL_COMMUNITY)
Admission: EM | Admit: 2012-09-12 | Discharge: 2012-09-13 | Disposition: A | Discharge status: Home / Self Care | Payer: Medicare Other | Attending: Emergency Medicine | Admitting: Emergency Medicine

## 2012-09-12 DIAGNOSIS — N840 Polyp of corpus uteri: Secondary | ICD-10-CM | POA: Insufficient documentation

## 2012-09-12 DIAGNOSIS — J449 Chronic obstructive pulmonary disease, unspecified: Secondary | ICD-10-CM | POA: Insufficient documentation

## 2012-09-12 DIAGNOSIS — Z8719 Personal history of other diseases of the digestive system: Secondary | ICD-10-CM | POA: Insufficient documentation

## 2012-09-12 DIAGNOSIS — K219 Gastro-esophageal reflux disease without esophagitis: Secondary | ICD-10-CM

## 2012-09-12 DIAGNOSIS — Z79899 Other long term (current) drug therapy: Secondary | ICD-10-CM | POA: Insufficient documentation

## 2012-09-12 DIAGNOSIS — K620 Anal polyp: Secondary | ICD-10-CM | POA: Insufficient documentation

## 2012-09-12 DIAGNOSIS — R0789 Other chest pain: Secondary | ICD-10-CM | POA: Insufficient documentation

## 2012-09-12 DIAGNOSIS — Z9889 Other specified postprocedural states: Secondary | ICD-10-CM | POA: Insufficient documentation

## 2012-09-12 DIAGNOSIS — K621 Rectal polyp: Secondary | ICD-10-CM | POA: Insufficient documentation

## 2012-09-12 DIAGNOSIS — K589 Irritable bowel syndrome without diarrhea: Secondary | ICD-10-CM | POA: Insufficient documentation

## 2012-09-12 DIAGNOSIS — Z8739 Personal history of other diseases of the musculoskeletal system and connective tissue: Secondary | ICD-10-CM | POA: Insufficient documentation

## 2012-09-12 DIAGNOSIS — E079 Disorder of thyroid, unspecified: Secondary | ICD-10-CM | POA: Insufficient documentation

## 2012-09-12 DIAGNOSIS — R110 Nausea: Secondary | ICD-10-CM | POA: Insufficient documentation

## 2012-09-12 DIAGNOSIS — J4489 Other specified chronic obstructive pulmonary disease: Secondary | ICD-10-CM | POA: Insufficient documentation

## 2012-09-12 DIAGNOSIS — Z87891 Personal history of nicotine dependence: Secondary | ICD-10-CM | POA: Insufficient documentation

## 2012-09-12 DIAGNOSIS — L4050 Arthropathic psoriasis, unspecified: Secondary | ICD-10-CM | POA: Insufficient documentation

## 2012-09-12 LAB — CBC WITH DIFFERENTIAL/PLATELET
Basophils Relative: 1 % (ref 0–1)
Eosinophils Absolute: 0.2 10*3/uL (ref 0.0–0.7)
Eosinophils Relative: 2 % (ref 0–5)
Hemoglobin: 13.3 g/dL (ref 12.0–15.0)
Lymphs Abs: 1.1 10*3/uL (ref 0.7–4.0)
MCH: 30 pg (ref 26.0–34.0)
MCHC: 32.4 g/dL (ref 30.0–36.0)
MCV: 92.6 fL (ref 78.0–100.0)
Monocytes Relative: 8 % (ref 3–12)
Neutrophils Relative %: 78 % — ABNORMAL HIGH (ref 43–77)
RBC: 4.44 MIL/uL (ref 3.87–5.11)

## 2012-09-12 LAB — BASIC METABOLIC PANEL
BUN: 5 mg/dL — ABNORMAL LOW (ref 6–23)
Calcium: 9 mg/dL (ref 8.4–10.5)
Creatinine, Ser: 0.85 mg/dL (ref 0.50–1.10)
GFR calc Af Amer: 75 mL/min — ABNORMAL LOW (ref >90)
GFR calc non Af Amer: 64 mL/min — ABNORMAL LOW (ref >90)
Glucose, Bld: 102 mg/dL — ABNORMAL HIGH (ref 70–99)

## 2012-09-12 MED ORDER — ALUM & MAG HYDROXIDE-SIMETH 200-200-20 MG/5ML PO SUSP
30.0000 mL | Freq: Once | ORAL | Status: AC
  Administered 2012-09-12: 30 mL via ORAL
  Filled 2012-09-12: qty 30

## 2012-09-12 MED ORDER — ONDANSETRON 4 MG PO TBDP
4.0000 mg | ORAL_TABLET | Freq: Once | ORAL | Status: AC
  Administered 2012-09-12: 4 mg via ORAL
  Filled 2012-09-12: qty 1

## 2012-09-12 NOTE — ED Provider Notes (Signed)
History     CSN: [id number]  Arrival date & time 09/12/12  4098   First MD Initiated Contact with Patient 09/12/12 [phone]      Chief Complaint  Patient presents with  . Chest Pain  . Nausea    (Consider location/radiation/quality/duration/timing/severity/associated sxs/prior treatment) HPI TABETHA HARAWAY is a 78 y.o. female with a past medical history significant for COPD as well as a left total knee replacement on October 30 at this year, she presents with sweating, nausea stenosis, and a heaviness in her chest this didn't constant since she woke this morning at 6:00. At its worst the pain was 3-4/10 was at the center of her chest, do not radiate, she says she woke up in a puddle of sweat but has not had any diaphoresis since. She has COPD and has chronic shortness of breath. She is still nauseous but has only some "discomfort" at this time.   Patient has no increased leg swelling in the operated leg, or shortness of breath is at baseline. She's never had a blood clot in her legs or lungs. She was previously taking a blood thinner postoperatively no longer on Xarelto.  Past Medical History  Diagnosis Date  . Hypothyroidism   . Psoriatic arthritis   . IBS (irritable bowel syndrome)   . Hemorrhoids   . Diverticulosis   . Rectal polyp   . GERD (gastroesophageal reflux disease)   . Family hx of colon cancer   . Uterine polyp   . Arthritis   . COPD (chronic obstructive pulmonary disease)   . Lymphocytic colitis     Past Surgical History  Procedure Date  . Knee surgery     lt.  Marland Kitchen Uterine polypectomy   . Cholecystectomy Dec 04, 2010  . Total knee revision 08/06/2012    Procedure: TOTAL KNEE REVISION;  Surgeon: Loanne Drilling, MD;  Location: [facility];  Service: Orthopedics;  Laterality: Left;  Left Total Knee Arthroplasty Revision    Family History  Problem Relation Age of Onset  . Heart disease Father     pacemaker  . Colon cancer Mother   . Lung cancer Father      History  Substance Use Topics  . Smoking status: Former Smoker -- 1.0 packs/day for 20 years    Types: Cigarettes    Quit date: 10/08/1980  . Smokeless tobacco: Never Used  . Alcohol Use: Yes     Comment: rarely    OB History    Grav Para Term Preterm Abortions TAB SAB Ect Mult Living                  Review of Systems At least 10pt or greater review of systems completed and are negative except where specified in the HPI.  Allergies  Codeine  Home Medications   Current Outpatient Rx  Name  Route  Sig  Dispense  Refill  . ALPRAZOLAM 0.5 MG PO TABS   Oral   Take 0.25-0.5 mg by mouth at bedtime as needed. Sleep         . DIPHENOXYLATE-ATROPINE 2.5-0.025 MG PO TABS   Oral   Take 1-2 tablets by mouth 3 (three) times daily as needed. diarrhea         . ESCITALOPRAM OXALATE 10 MG PO TABS   Oral   Take 5 mg by mouth daily. For six days and then take 10 mg daily. Started Sep 08, 2012         . LEVOTHYROXINE SODIUM  112 MCG PO TABS   Oral   Take 112 mcg by mouth daily before breakfast.          . TIOTROPIUM BROMIDE MONOHYDRATE 18 MCG IN CAPS   Inhalation   Place 18 mcg into inhaler and inhale daily.         . ACETAMINOPHEN 325 MG PO TABS   Oral   Take 2 tablets (650 mg total) by mouth every 6 (six) hours as needed (or Fever >/= 101).   60 tablet   0   . BETAMETHASONE DIPROPIONATE 0.05 % EX OINT   Topical   Apply 1 application topically 2 (two) times daily.         Marland Kitchen BISACODYL 10 MG RE SUPP   Rectal   Place 1 suppository (10 mg total) rectally daily as needed.   12 suppository   0   . CETIRIZINE HCL 10 MG PO TABS   Oral   Take 10 mg by mouth daily.         . DSS 100 MG PO CAPS   Oral   Take 100 mg by mouth 2 (two) times daily.   60 capsule   0   . LEVALBUTEROL TARTRATE 45 MCG/ACT IN AERO   Inhalation   Inhale 1-2 puffs into the lungs every 4 (four) hours as needed. Wheezing         . METHOCARBAMOL 500 MG PO TABS   Oral   Take 1  tablet (500 mg total) by mouth every 6 (six) hours as needed.   80 tablet   0   . METHYLCELLULOSE 1 % OP SOLN   Both Eyes   Place 1 drop into both eyes 2 (two) times daily as needed. Dry eyes         . OMEPRAZOLE 20 MG PO CPDR   Oral   Take 20 mg by mouth 2 (two) times daily.         Marland Kitchen ONDANSETRON HCL 4 MG PO TABS   Oral   Take 1 tablet (4 mg total) by mouth every 6 (six) hours as needed for nausea.   40 tablet   0   . OXYCODONE HCL 5 MG PO TABS   Oral   Take 1-2 tablets (5-10 mg total) by mouth every 3 (three) hours as needed.   80 tablet   0   . POLYETHYLENE GLYCOL 3350 PO PACK   Oral   Take 17 g by mouth daily as needed.   14 each   0   . RIVAROXABAN 10 MG PO TABS   Oral   Take 1 tablet (10 mg total) by mouth daily with breakfast. Take Xarelto for two and a half more weeks, then discontinue Xarelto.   18 tablet   0   . TRAMADOL HCL 50 MG PO TABS   Oral   Take 1-2 tablets (50-100 mg total) by mouth every 6 (six) hours as needed (mild pain).   60 tablet   0     BP 122/77  Pulse 87  Temp 98.2 F (36.8 C) (Oral)  Resp 16  SpO2 99%  Physical Exam  Nursing notes reviewed.  Electronic medical record reviewed. VITAL SIGNS:   Filed Vitals:   09/12/12 0803 09/12/12 0805  BP:  122/77  Pulse: 87   Temp: 98.2 F (36.8 C)   TempSrc: Oral   Resp: 16   SpO2: 99%    CONSTITUTIONAL: Awake, oriented, appears non-toxic HENT: Atraumatic, normocephalic, oral mucosa pink  and moist, airway patent. Nares patent without drainage. External ears normal. EYES: Conjunctiva clear, EOMI, PERRLA NECK: Trachea midline, non-tender, supple CARDIOVASCULAR: Normal heart rate, Normal rhythm, No murmurs, rubs, gallops PULMONARY/CHEST: Clear to auscultation, no rhonchi, wheezes, or rales. Symmetrical breath sounds. Non-tender. ABDOMINAL: Non-distended, soft, non-tender - no rebound or guarding.  BS normal. NEUROLOGIC: Non-focal, moving all four extremities, no gross sensory or  motor deficits. EXTREMITIES: No clubbing, cyanosis, or edema. Healing midline incision on the left knee, clean dry intact. No tenderness to palpation in either calf or in the deep vein systems of either leg. Leg does not appear more swollen than the other. SKIN: Warm, Dry, No erythema, No rash  ED Course  Procedures (including critical care time)  Date: 09/12/2012  Rate: 90  Rhythm: normal sinus rhythm  QRS Axis: Right  Intervals: normal  ST/T Wave abnormalities: normal  Conduction Disutrbances: none  Narrative Interpretation: Right axis, no ST or T wave abnormalities suggestive of acute ischemia or infarction.   Date: 09/12/2012  Rate: 78  Rhythm: normal sinus rhythm  QRS Axis: normal  Intervals: normal  ST/T Wave abnormalities: normal  Conduction Disutrbances: none  Narrative Interpretation: unremarkable   Labs Reviewed  CBC WITH DIFFERENTIAL - Abnormal; Notable for the following:    Neutrophils Relative 78 (*)     All other components within normal limits  BASIC METABOLIC PANEL - Abnormal; Notable for the following:    Sodium 134 (*)     Glucose, Bld 102 (*)     BUN 5 (*)     GFR calc non Af Amer 64 (*)     GFR calc Af Amer 75 (*)     All other components within normal limits  TROPONIN I  TROPONIN I   No results found.   1. Atypical chest pain   2. GERD (gastroesophageal reflux disease)       MDM  JESSCIA IMM is a 78 y.o. female presenting with chest heaviness. Patient's initial EKG is remarkable for a rightward axis. This may also be some the placement. There is no evidence on the patient's EKG of lateral infarction. Obtain cardiac markers, chest x-ray, basic labs. We'll treat the patient's nausea.  Patient is completely pain-free at this time, she has no nausea, no chest heaviness with Mylanta.   Patient secondary EKG is normal - no right axis likely lead placement error with first EKG and second troponin is within normal limits.  She has a new  appointment with Dr. Shirlee Latch to be evaluated in the cardiology clinic, discussed with Trish from [facility] cardiology and patient will go in next week on Wednesday for an early appointment with Dr. Shirlee Latch.  Further history, the patient became alarmed today mostly because of her physical therapist had been concerned with her elevated heart rate at about 120 when she is doing physical therapy. I reassured the patient is quite normal to have some intermittent tachycardia when exercising as long as it's not associated with any chest pain, nausea vomiting, shortness of breath or any other symptoms consistent with acute coronary syndromes. I do not think patient has acute coronary syndrome, think her pain is coming from her stomach, and treated this with Mylanta, and instructions to take Mylanta as needed at home. She's also previously been given Prilosec at home and told to take this on a when necessary basis, will have her restart that as well.  Likewise even though the patient has had recent surgery a do not think she has  a PE, well score is once to placing her in a low-risk category. No further testing required for PE.  I explained the diagnosis and have given explicit precautions to return to the ER including severe chest pain, shortness of or any other new or worsening symptoms. The patient understands and accepts the medical plan as it's been dictated and I have answered their questions. Discharge instructions concerning home care and prescriptions have been given.  The patient is STABLE and is discharged to home in good condition.         Jones Skene, MD 09/12/12 1709

## 2012-09-12 NOTE — ED Notes (Signed)
To ED for eval of chest heaviness, weakness, and nausea. Woke 'soaking wet' this am. Recent knee replacement. No sob.

## 2012-09-16 ENCOUNTER — Ambulatory Visit (HOSPITAL_COMMUNITY)
Admission: RE | Admit: 2012-09-16 | Discharge: 2012-09-16 | Disposition: A | Discharge status: Home / Self Care | Payer: Medicare Other | Source: Ambulatory Visit | Attending: Orthopedic Surgery | Admitting: Orthopedic Surgery

## 2012-09-16 DIAGNOSIS — M25569 Pain in unspecified knee: Secondary | ICD-10-CM | POA: Insufficient documentation

## 2012-09-16 DIAGNOSIS — M6281 Muscle weakness (generalized): Secondary | ICD-10-CM | POA: Insufficient documentation

## 2012-09-16 DIAGNOSIS — R269 Unspecified abnormalities of gait and mobility: Secondary | ICD-10-CM | POA: Insufficient documentation

## 2012-09-16 DIAGNOSIS — R262 Difficulty in walking, not elsewhere classified: Secondary | ICD-10-CM | POA: Insufficient documentation

## 2012-09-16 DIAGNOSIS — IMO0001 Reserved for inherently not codable concepts without codable children: Secondary | ICD-10-CM | POA: Insufficient documentation

## 2012-09-16 NOTE — Evaluation (Addendum)
Physical Therapy Evaluation  Patient Details  Name: [name] MRN: [id number] Date of Birth: 1933/02/11  Today's Date: 09/16/2012 Time: 1015-1105 PT Time Calculation (min): 50 min Charges: 1 eval, 15' TE Visit#: 1  of 8   Re-eval: 10/16/12 Assessment Diagnosis: L TKR Surgical Date: 08/06/12 Next MD Visit: Dr. Despina Hick - January 16th Prior Therapy: SNF and HHPT  Authorization: Medicare  Authorization Time Period:    Authorization Visit#: 1  of 10    Past Medical History:  Past Medical History  Diagnosis Date  . Hypothyroidism   . Psoriatic arthritis   . IBS (irritable bowel syndrome)   . Hemorrhoids   . Diverticulosis   . Rectal polyp   . GERD (gastroesophageal reflux disease)   . Family hx of colon cancer   . Uterine polyp   . Arthritis   . COPD (chronic obstructive pulmonary disease)   . Lymphocytic colitis    Past Surgical History:  Past Surgical History  Procedure Date  . Knee surgery     lt.  Marland Kitchen Uterine polypectomy   . Cholecystectomy Dec 04, 2010  . Total knee revision 08/06/2012    Procedure: TOTAL KNEE REVISION;  Surgeon: Loanne Drilling, MD;  Location: [facility];  Service: Orthopedics;  Laterality: Left;  Left Total Knee Arthroplasty Revision   Subjective Symptoms/Limitations Pertinent History: Pt is referred to PT s/p L TKA on 08/06/12 which was a revision from a surgery 4 years ago secondary to significant pain with her original TKR.  She was discharged to [facility] for continued rehab and had 2 weeks of HHPT.  She presents today with complaints of fear of independent ambulation.  She is currently walking during the day with a SPC and at night with a RW, also complaints of increased swelling.  How long can you sit comfortably?: 10-15 minutes How long can you stand comfortably?: 10 minutes in her kitchen  How long can you walk comfortably?: 5 minutes Patient Stated Goals: "I want to be able to walk by myself and go shopping." Pain  Assessment Currently in Pain?: Yes Pain Score:   2 (taking 2 pain medication. Range: 0-8/10) Pain Location: Knee Pain Orientation: Left Pain Type: Acute pain;Surgical pain Pain Relieving Factors: pain medication, ice Effect of Pain on Daily Activities: unable to ambulate independently.  Prior Functioning  Home Living Lives With: Alone Additional Comments: Need to use railing to get inside house.  Prior Function Driving: Yes Comments: Enjoys gardening, planting flowers, shopping, enjoys being around her grandchildren and great grandchildren.   Cognition/Observation Observation/Other Assessments Observations: sits comfortably in the chair  Sensation/Coordination/Flexibility/Functional Tests Functional Tests Functional Tests: 30 sec STS: 3x complete  Assessment LLE AROM (degrees) Left Knee Extension: 9  Left Knee Flexion: 111  LLE PROM (degrees) Left Knee Extension: 4 Left Knee Flexion: 111 LLE Strength Left Hip Flexion: 3+/5 Left Hip Extension: 3-/5 Left Hip ABduction: 4/5 Left Knee Flexion: 4/5 Left Knee Extension: 4/5 Palpation Palpation: increased pain and tenderness to patellar region, especially with inferior and superior mobility.    Mobility/Balance  Ambulation/Gait Ambulation/Gait: Yes Assistive device: Rolling walker Gait Pattern: Decreased hip/knee flexion - left;Left foot flat Static Standing Balance Single Leg Stance - Right Leg: 5  Single Leg Stance - Left Leg: 5  Tandem Stance - Right Leg: 8  Tandem Stance - Left Leg: 5  Rhomberg - Eyes Opened: 10  Rhomberg - Eyes Closed: 10    Exercise/Treatments Patellar mobilizations - demonstration and education  Supine Quad Sets:  Left;10 reps Straight Leg Raises: Left;10 reps Sidelying Hip ABduction: Left;10 reps Prone  Hamstring Curl: 10 reps Hip Extension: Left;10 reps Other Prone Exercises: TKE x10  Physical Therapy Assessment and Plan PT Assessment and Plan Clinical Impression Statement: Pt is a  78 year old female referred to PT s/p L knee TKA revision  Pt will benefit from skilled therapeutic intervention in order to improve on the following deficits: Abnormal gait;Decreased balance;Decreased range of motion;Decreased strength;Pain;Decreased activity tolerance Rehab Potential: Good PT Frequency: Min 2X/week PT Duration: 4 weeks PT Treatment/Interventions: Gait training;Stair training;Functional mobility training;Therapeutic exercise;Balance training;Patient/family education;Neuromuscular re-education;Manual techniques PT Plan: Encourage appropratie gait mechanics, improved functional knee extension, squats, heel/toe raises, knee flexion, standing TKE, stool scoots, tandem stance, stance on step, SLS, mat activities to improve strength.  Progress focus to improving confidence with independent ambulation    Goals Home Exercise Program Pt will Perform Home Exercise Program: Independently PT Goal: Perform Home Exercise Program - Progress: Goal set today PT Short Term Goals Time to Complete Short Term Goals: 2 weeks PT Short Term Goal 1: Pt will improve LE strength by 1 muscle grade.  PT Short Term Goal 2: Pt will improve static standing balance and demonstrate 30 sec L SLS on solid surface.  PT Short Term Goal 3: Pt will report pain less than 3/10 with therapy sessions.  PT Short Term Goal 4: Pt will improve L knee AROM: 4-115 degrees.  PT Long Term Goals Time to Complete Long Term Goals: 4 weeks PT Long Term Goal 1: Pt will improve her ABC to 80% for improved percieved functional ability.  PT Long Term Goal 2: Pt will improve LE strength in order to ambulate independently outdoors for 1 hour to conitnue with shopping activities.  Long Term Goal 3: Pt will improve LE functional strength to [facility] in order to complete 5 STS in 30 seconds.   Problem List Patient Active Problem List  Diagnosis  . HYPOTHYROIDISM  . COPD  . COLITIS  . ARTHRITIS  . DIARRHEA  . ABDOMINAL PAIN -GENERALIZED   . PERSONAL HX COLONIC POLYPS  . Allergic rhinitis, seasonal  . Bloating  . Upper abdominal pain  . Instability of prosthetic knee  . Postop Acute blood loss anemia  . Difficulty in walking  . Muscle weakness (generalized)    PT Plan of Care PT Home Exercise Plan: see scanned report PT Patient Instructions: continue with HEP 2x/day Consulted and Agree with Plan of Care: Patient  GP Functional Assessment Tool Used: ABC: 52.5% Functional Limitation: Mobility: Walking and moving around Mobility: Walking and Moving Around Current Status ([id number]): At least 40 percent but less than 60 percent impaired, limited or restricted Mobility: Walking and Moving Around Goal Status [phone]): At least 1 percent but less than 20 percent impaired, limited or restricted  Daman Steffenhagen, PT 09/16/2012, 12:20 PM  Physician Documentation Your signature is required to indicate approval of the treatment plan as stated above.  Please sign and either send electronically or make a copy of this report for your files and return this physician signed original.   Please mark one 1.__approve of plan  2. ___approve of plan with the following conditions.   ______________________________  _____________________ Physician Signature                                                                                                             Date

## 2012-09-17 ENCOUNTER — Ambulatory Visit (INDEPENDENT_AMBULATORY_CARE_PROVIDER_SITE_OTHER): Payer: Medicare Other | Admitting: Cardiology

## 2012-09-17 ENCOUNTER — Ambulatory Visit: Payer: Medicare Other | Admitting: Cardiology

## 2012-09-17 ENCOUNTER — Encounter: Payer: Self-pay | Admitting: Cardiology

## 2012-09-17 VITALS — BP 138/66 | HR 87 | Ht 65.0 in | Wt 176.8 lb

## 2012-09-17 DIAGNOSIS — R079 Chest pain, unspecified: Secondary | ICD-10-CM

## 2012-09-17 DIAGNOSIS — R000 Tachycardia, unspecified: Secondary | ICD-10-CM | POA: Insufficient documentation

## 2012-09-17 NOTE — Progress Notes (Signed)
Patient ID: [name], female   DOB: April 26, 1933, 78 y.o.   MRN: [id number] PCP: Dr. Willey Blade  78 yo with history of psoriatic arthritis, lymphocytic colitis, and COPD presents for cardiology evaluation. Patient was in the ER on 12/6 for chest pressure.  She woke up that morning with chest pressure, nausea, and diaphoresis.  This lasted for about 2 hours.  Workup in the ER was negative.  Symptoms gradually subsided.  She has not had symptoms like this before or since that episode.     She also has been noted to have sinus tachycardia.  Her HR will rise to the 130s with minimal exercise when she works with PT (recent knee replacement).  At rest today, HR is in the 80s.  No lightheaded spells, no syncope.  She is now walking with a cane.  She does fairly well on flat ground but gets short of breath climbing a flight of steps.    ECG: NSR, normal  Labs (12/13): TnI negative x 2, TSH normal, LDL 59  PMH: 1. Lymphocytic colitis 2. Hypothyroidism 3. Psoriatic arthritis with h/o MTX use.  4. OA s/p TKR 5. COPD with h/o exacerbations.  Prior smoker.  6. GERD with hiatal hernia 7. CCY 8. Depression  SH: Former smoker, quit in 1982, lives alone.   FH: Father with PCM, brother with MI.   ROS: All systems reviewed and negative except as per HPI.   Current Outpatient Prescriptions  Medication Sig Dispense Refill  . ALPRAZolam (XANAX) 0.5 MG tablet Take 0.25-0.5 mg by mouth at bedtime as needed. Sleep      . betamethasone dipropionate (DIPROLENE) 0.05 % ointment Apply 1 application topically 2 (two) times daily.      . diphenoxylate-atropine (LOMOTIL) 2.5-0.025 MG per tablet Take 1-2 tablets by mouth 3 (three) times daily as needed. diarrhea      . escitalopram (LEXAPRO) 10 MG tablet Take 5 mg by mouth daily. For six days and then take 10 mg daily. Started Sep 08, 2012      . levalbuterol (XOPENEX HFA) 45 MCG/ACT inhaler Inhale 1-2 puffs into the lungs every 4 (four) hours as needed. Wheezing       . levothyroxine (SYNTHROID, LEVOTHROID) 112 MCG tablet Take 112 mcg by mouth daily before breakfast.       . methocarbamol (ROBAXIN) 500 MG tablet Take 1 tablet (500 mg total) by mouth every 6 (six) hours as needed.  80 tablet  0  . methylcellulose (ARTIFICIAL TEARS) 1 % ophthalmic solution Place 1 drop into both eyes 2 (two) times daily as needed. Dry eyes      . oxyCODONE (OXY IR/ROXICODONE) 5 MG immediate release tablet Take 1-2 tablets (5-10 mg total) by mouth every 3 (three) hours as needed.  80 tablet  0  . tiotropium (SPIRIVA) 18 MCG inhalation capsule Place 18 mcg into inhaler and inhale daily.      . traMADol (ULTRAM) 50 MG tablet Take 1-2 tablets (50-100 mg total) by mouth every 6 (six) hours as needed (mild pain).  60 tablet  0  . acetaminophen (TYLENOL) 325 MG tablet Take 2 tablets (650 mg total) by mouth every 6 (six) hours as needed (or Fever >/= 101).  60 tablet  0  . [DISCONTINUED] Calcium Carbonate (CALCIUM 600) 1500 MG TABS Take 2 tablets by mouth daily.          BP 138/66  Pulse 87  Ht 5' 5"  (1.651 m)  Wt 176 lb 12.8 oz (80.196  kg)  BMI 29.42 kg/m2 General: NAD Neck: No JVD, no thyromegaly or thyroid nodule.  Lungs: Prolonged respiratory phase CV: Nondisplaced PMI.  Heart regular S1/S2, no S3/S4, no murmur.  No peripheral edema.  No carotid bruit.  Normal pedal pulses.  Abdomen: Soft, nontender, no hepatosplenomegaly, no distention.  Skin: Intact without lesions or rashes.  Neurologic: Alert and oriented x 3.  Psych: Normal affect. Extremities: No clubbing or cyanosis.  HEENT: Normal.   Assessment/Plan: 1. Chest pain: Atypical with one prolonged episode at rest on 12/6.  Risk factors include family history (brother with MI) and prior smoking.  I will set her up for a Lexiscan myoview to risk stratify.  2. Tachycardia: I suspect that Ms Mccarney's sinus tachycardia is a normal response to exercise, even minimal, in a deconditioned patient.  HR is appropriate at rest, in  the 89s.    [name] 09/17/2012

## 2012-09-17 NOTE — Patient Instructions (Addendum)
Your physician has requested that you have a lexiscan myoview. For further information please visit [email or web address]. Please follow instruction sheet, as given.  Your physician recommends that you schedule a follow-up appointment in: 1 month with Dr Aundra Dubin.

## 2012-09-18 ENCOUNTER — Ambulatory Visit (HOSPITAL_COMMUNITY)
Admission: RE | Admit: 2012-09-18 | Discharge: 2012-09-18 | Disposition: A | Discharge status: Home / Self Care | Payer: Medicare Other | Source: Ambulatory Visit | Attending: Orthopedic Surgery | Admitting: Orthopedic Surgery

## 2012-09-18 NOTE — Progress Notes (Signed)
Physical Therapy Treatment Patient Details  Name: [name] MRN: [id number] Date of Birth: 1933/02/23  Today's Date: 09/18/2012 Time: 1303-1355 PT Time Calculation (min): 52 min Visit#: 2  of 8   Re-eval: 10/16/12 Authorization: Medicare  Authorization Visit#: 2  of 10    Subjective: Symptoms/Limitations Symptoms: Pt states her knee is hurting quite a bit today;  more so than original visit.  States it was really sore yesterday. Pain Assessment Currently in Pain?: Yes Pain Score:   4   Exercise/Treatments Aerobic Stationary Bike: 6' @ 3.0, seat 9 Standing Heel Raises: 10 reps;Limitations Heel Raises Limitations: toeraises 10 reps Knee Flexion: 10 reps;Left Supine Quad Sets: 15 reps;Left Short Arc Quad Sets: 15 reps Straight Leg Raises: 3 sets;5 reps;Left;Limitations Straight Leg Raises Limitations: 3# Sidelying Hip ABduction: 15 reps;Left;Limitations Hip ABduction Limitations: 3# Prone  Hamstring Curl: 15 reps;Limitations Hamstring Curl Limitations: 3# Hip Extension: 15 reps;Left Other Prone Exercises: TKE x10      Physical Therapy Assessment and Plan PT Assessment and Plan Clinical Impression Statement: Limited WB exercises added today due to c/o discomfort.  Added new mat exercises per POC without diffiuculty or increase in pain.  Added MFR to anterior knee to help decrease scar tissue and adhesions.  Pt continues to use QC, though she admits to amb. without AD some inside her home.  Would like to begin all ambulation without using AD.   PT Plan: Encourage appropratie gait mechanics, improved functional knee extension.  Add squats, standing TKE, stool scoots, tandem stance, stance on step, SLS, mat activities to improve strength.  Encourage gt. without AD and Progress to outdoor ambulation without AD     Problem List Patient Active Problem List  Diagnosis  . HYPOTHYROIDISM  . COPD  . COLITIS  . ARTHRITIS  . DIARRHEA  . ABDOMINAL PAIN -GENERALIZED  .  PERSONAL HX COLONIC POLYPS  . Allergic rhinitis, seasonal  . Bloating  . Upper abdominal pain  . Instability of prosthetic knee  . Postop Acute blood loss anemia  . Difficulty in walking  . Muscle weakness (generalized)  . Tachycardia  . Chest pain    PT - End of Session Activity Tolerance: Patient tolerated treatment well General Behavior During Session: [facility] for tasks performed Cognition: [facility] for tasks performed   [name], PTA/CLT 09/18/2012, 2:20 PM

## 2012-09-22 ENCOUNTER — Encounter: Payer: Self-pay | Admitting: Cardiology

## 2012-09-23 ENCOUNTER — Ambulatory Visit (HOSPITAL_COMMUNITY): Payer: Medicare Other | Attending: Cardiology | Admitting: Radiology

## 2012-09-23 ENCOUNTER — Ambulatory Visit (HOSPITAL_COMMUNITY)
Admission: RE | Admit: 2012-09-23 | Discharge: 2012-09-23 | Disposition: A | Discharge status: Home / Self Care | Payer: Medicare Other | Source: Ambulatory Visit | Attending: Orthopedic Surgery | Admitting: Orthopedic Surgery

## 2012-09-23 VITALS — BP 113/51 | HR 69 | Ht 65.0 in | Wt 174.0 lb

## 2012-09-23 DIAGNOSIS — R000 Tachycardia, unspecified: Secondary | ICD-10-CM | POA: Insufficient documentation

## 2012-09-23 DIAGNOSIS — R0789 Other chest pain: Secondary | ICD-10-CM | POA: Insufficient documentation

## 2012-09-23 DIAGNOSIS — R079 Chest pain, unspecified: Secondary | ICD-10-CM

## 2012-09-23 DIAGNOSIS — R0989 Other specified symptoms and signs involving the circulatory and respiratory systems: Secondary | ICD-10-CM | POA: Insufficient documentation

## 2012-09-23 DIAGNOSIS — R0609 Other forms of dyspnea: Secondary | ICD-10-CM | POA: Insufficient documentation

## 2012-09-23 DIAGNOSIS — R0602 Shortness of breath: Secondary | ICD-10-CM

## 2012-09-23 HISTORY — PX: CARDIOVASCULAR STRESS TEST: SHX262

## 2012-09-23 MED ORDER — TECHNETIUM TC 99M SESTAMIBI GENERIC - CARDIOLITE
11.0000 | Freq: Once | INTRAVENOUS | Status: AC | PRN
  Administered 2012-09-23: 11 via INTRAVENOUS

## 2012-09-23 MED ORDER — TECHNETIUM TC 99M SESTAMIBI GENERIC - CARDIOLITE
33.0000 | Freq: Once | INTRAVENOUS | Status: AC | PRN
  Administered 2012-09-23: 33 via INTRAVENOUS

## 2012-09-23 MED ORDER — REGADENOSON 0.4 MG/5ML IV SOLN
0.4000 mg | Freq: Once | INTRAVENOUS | Status: AC
  Administered 2012-09-23: 0.4 mg via INTRAVENOUS

## 2012-09-23 NOTE — Progress Notes (Signed)
Physical Therapy Treatment Patient Details  Name: [name] MRN: [id number] Date of Birth: October 20, 1932  Today's Date: 09/23/2012 Time: 4098-1191 PT Time Calculation (min): 53 min  Visit#: 3  of 8   Re-eval: 10/16/12  Charge: Gait 20', therex 23', ice 10'  Authorization: Medicare  Authorization Time Period:    Authorization Visit#: 3  of 10    Subjective: Symptoms/Limitations Symptoms: Pt stated she was sore following last session but feeling better today, pain scale 3/10.   Pain Assessment Currently in Pain?: Yes Pain Score:   3 Pain Location: Knee Pain Orientation: Left  Objective:  Exercise/Treatments Aerobic Stationary Bike: 6' @ 3.0, seat 9-->10 Standing Heel Raises: 10 reps;Limitations Heel Raises Limitations: toeraises 10 reps Knee Flexion: Left;15 reps Terminal Knee Extension: Left;10 reps;Theraband Theraband Level (Terminal Knee Extension): Level 4 (Blue) Functional Squat: 10 reps SLS: R 46", L 41" max of 3 Gait Training: Gait training with no AD x 20' with cueing for knee ext with heel strike and knee flexion when on toe, equalize then increase stride length for normalized gait mechanics to reduce limp Other Standing Knee Exercises: tandem stance 2x 30", tandem stance with foot on 4 in 2x 30" Seated Stool Scoot - Round Trips: unable this session per back pain with sitting on stool- try again next session Time with all mat activities- resume next session  Modalities Modalities: Cryotherapy Cryotherapy Number Minutes Cryotherapy: 10 Minutes Cryotherapy Location: Knee Type of Cryotherapy: Ice pack  Physical Therapy Assessment and Plan PT Assessment and Plan Clinical Impression Statement: Began standing exercises for improved gait mechanics and functional strengthening exercises.  Gait training complete with no AD with cueing for knee extension with heel strike and flexion when toe off and to equalize/increase stride length for more normalized gait.  Pt  with no LOB episodes during gait training with no AD.  Pt did require multimodal cueing for proper form with functional squats to reduce stress on knees but able to demonstrate appropriate form following cues.  Min assistance required with balance activities, great improvements following cueing for spatial awareness with SLS R 46" and L 41".  PT Plan: Continue with current POC, encourage appropriate gait mechanics without AD and progress to outdoor ambulation with no AD.  Continue focusing on functional knee extension.for gait mechanics and overall functional strengthening.      Goals    Problem List Patient Active Problem List  Diagnosis  . HYPOTHYROIDISM  . COPD  . COLITIS  . ARTHRITIS  . DIARRHEA  . ABDOMINAL PAIN -GENERALIZED  . PERSONAL HX COLONIC POLYPS  . Allergic rhinitis, seasonal  . Bloating  . Upper abdominal pain  . Instability of prosthetic knee  . Postop Acute blood loss anemia  . Difficulty in walking  . Muscle weakness (generalized)  . Tachycardia  . Chest pain    PT - End of Session Activity Tolerance: Patient tolerated treatment well General Behavior During Session: [facility] for tasks performed Cognition: [facility] for tasks performed  GP    Juel Burrow 09/23/2012, 6:25 PM

## 2012-09-23 NOTE — Progress Notes (Addendum)
[facility] [address] [place], [place] [address] [phone]    Cardiology Nuclear Med Study  [name] is a 78 y.o. female     MRN : [id number]     DOB: [birth year]  Procedure Date: 09/23/2012  Nuclear Med Background Indication for Stress Test:  Evaluation for Ischemia and [facility]: 09/12/12 with Chest Pressure, Negative Enzymes History:  No previous documented CAD Cardiac Risk Factors: Family History - CAD and History of Smoking  Symptoms:  Chest Pressure.  (last episode of chest discomfort: none since discharge), Diaphoresis, DOE, Nausea and Rapid HR   Nuclear Pre-Procedure Caffeine/Decaff Intake:  None NPO After: 8:00pm   Lungs:  Clear. O2 Sat: 95% on room air. IV 0.9% NS with Angio Cath:  22g  IV Site: L Antecubital  IV Started by:  Perrin Maltese, EMT-P  Chest Size (in):  40 Cup Size: DDD  Height: 5' 5"  (1.651 m)  Weight:  174 lb (78.926 kg)  BMI:  Body mass index is 28.96 kg/(m^2). Tech Comments:  n/a    Nuclear Med Study 1 or 2 day study: 1 day  Stress Test Type:  Lexiscan  Reading MD: Mertie Moores, MD  Order Authorizing Provider:  [name], MD  Resting Radionuclide: Technetium 77mSestamibi  Resting Radionuclide Dose: 11.0 mCi   Stress Radionuclide:  Technetium 927mestamibi  Stress Radionuclide Dose: 33.0 mCi           Stress Protocol Rest HR: 69 Stress HR: 100  Rest BP: 113/51 Stress BP: 119/60  Exercise Time (min): n/a METS: n/a   Predicted Max HR: 143 bpm % Max HR: 69.93 bpm Rate Pressure Product: 11900    Dose of Adenosine (mg):  n/a Dose of Lexiscan: 0.4 mg  Dose of Atropine (mg): n/a Dose of Dobutamine: n/a mcg/kg/min (at max HR)  Stress Test Technologist: ShLetta MoynahanCMA-N  Nuclear Technologist:  StCharlton AmorCNMT     Rest Procedure:  Myocardial perfusion imaging was performed at rest 45 minutes following the intravenous administration of Technetium 9970mstamibi.  Rest ECG: NSR -  Normal EKG  Stress Procedure:  The patient received IV Lexiscan 0.4 mg over 15-seconds.  Technetium 61m29mtamibi injected at 30-seconds.  She did c/o chest tightness with Lexiscan.  Quantitative spect images were obtained after a 45 minute delay.  Stress ECG: No significant change from baseline ECG  QPS Raw Data Images:  Normal; no motion artifact; normal heart/lung ratio. Stress Images:  There is mild apical thinning with normal uptake in other regions. Rest Images:  There is mild apical thinning with normal uptake in other regions. Subtraction (SDS):  No evidence of ischemia. Transient Ischemic Dilatation (Normal <1.22):  1.16 Lung/Heart Ratio (Normal <0.45):  0.31  Quantitative Gated Spect Images QGS EDV:  50 ml QGS ESV:  12 ml  Impression Exercise Capacity:  Lexiscan with no exercise. BP Response:  Normal blood pressure response. Clinical Symptoms:  No significant symptoms noted. ECG Impression:  No significant ST segment change suggestive of ischemia. Comparison with Prior Nuclear Study: No previous nuclear study performed  Overall Impression:  Low risk stress nuclear study.   There is apical thinning but no evidence of ischemia.  I think this is artifact.  The apex contracts vigorously.  LV Ejection Fraction: 75%.  LV Wall Motion:  NL LV Function; NL Wall Motion.    PhilThayer Headings.,Brooke BonitoD, FACCOverton Brooks Va [facility] Center ([place]17/2013, 4:49 PM Office - [phone]  a normal study.  No evidence for ischemia.   [name] 09/24/2012   .

## 2012-09-24 NOTE — Progress Notes (Signed)
Pt.notified

## 2012-09-25 ENCOUNTER — Ambulatory Visit (HOSPITAL_COMMUNITY): Payer: Medicare Other

## 2012-09-26 ENCOUNTER — Ambulatory Visit (HOSPITAL_COMMUNITY)
Admission: RE | Admit: 2012-09-26 | Discharge: 2012-09-26 | Disposition: A | Discharge status: Home / Self Care | Payer: Medicare Other | Source: Ambulatory Visit | Attending: Orthopedic Surgery | Admitting: Orthopedic Surgery

## 2012-09-26 NOTE — Progress Notes (Signed)
Physical Therapy Treatment Patient Details  Name: [name] MRN: [id number] Date of Birth: 1933-02-21  Today's Date: 09/26/2012 Time: 1191-4782 PT Time Calculation (min): 51 min Charges: 31' TE, 10' Manual, 1 ice Visit#: 4  of 8   Re-eval: 10/16/12    Authorization: Medicare  Authorization Time Period:    Authorization Visit#: 4  of 10    Subjective: Symptoms/Limitations Symptoms: Pt reports that she went grocery shopping and christmas shopping yesterday and her knee is very sore.   Precautions/Restrictions     Exercise/Treatments Aerobic Stationary Bike: 6' @ 3.0, seat 7 Standing Lateral Step Up: Left;10 reps;Hand Hold: 1;Step Height: 4" Forward Step Up: Left;10 reps;Hand Hold: 1;Step Height: 4" Rocker Board: 2 minutes Other Standing Knee Exercises: Tandem Gait, Retro Gait, Heel and Toe Walking 1 RT each Other Standing Knee Exercises: Standing on Step 3x30 sec BLE  Modalities Modalities: Cryotherapy Manual Therapy Manual Therapy: Joint mobilization Joint Mobilization: Patellar and fibular head Grade I-III to decrease pain and improve ROM w/STM after x10 minutes Cryotherapy Number Minutes Cryotherapy: 10 Minutes Cryotherapy Location: Knee  Physical Therapy Assessment and Plan PT Assessment and Plan Clinical Impression Statement: Pt continues to improve strength and ROM and is doing remarakably on her balance.  She is limited today by moderate pain and continues to require cueing for proper gait mechanics.  Added activities to improve dynamic balance and improve gait mechanics.  PT Plan: Continue with current POC, encourage appropriate gait mechanics without AD and progress to outdoor ambulation with no AD.  Continue focusing on functional knee extension.for gait mechanics and overall functional strengthening.      Goals Home Exercise Program Pt will Perform Home Exercise Program: Independently PT Goal: Perform Home Exercise Program - Progress: Progressing  toward goal PT Short Term Goals Time to Complete Short Term Goals: 2 weeks PT Short Term Goal 1: Pt will improve LE strength by 1 muscle grade.  PT Short Term Goal 1 - Progress: Progressing toward goal PT Short Term Goal 2: Pt will improve static standing balance and demonstrate 30 sec L SLS on solid surface.  PT Short Term Goal 2 - Progress: Met PT Short Term Goal 3: Pt will report pain less than 3/10 with therapy sessions.  PT Short Term Goal 3 - Progress: Progressing toward goal PT Short Term Goal 4: Pt will improve L knee AROM: 4-115 degrees.  PT Short Term Goal 4 - Progress: Progressing toward goal PT Long Term Goals Time to Complete Long Term Goals: 4 weeks PT Long Term Goal 1: Pt will improve her ABC to 80% for improved percieved functional ability.  PT Long Term Goal 2: Pt will improve LE strength in order to ambulate independently outdoors for 1 hour to conitnue with shopping activities.  Long Term Goal 3: Pt will improve LE functional strength to [facility] in order to complete 5 STS in 30 seconds.   Problem List Patient Active Problem List  Diagnosis  . HYPOTHYROIDISM  . COPD  . COLITIS  . ARTHRITIS  . DIARRHEA  . ABDOMINAL PAIN -GENERALIZED  . PERSONAL HX COLONIC POLYPS  . Allergic rhinitis, seasonal  . Bloating  . Upper abdominal pain  . Instability of prosthetic knee  . Postop Acute blood loss anemia  . Difficulty in walking  . Muscle weakness (generalized)  . Tachycardia  . Chest pain    PT - End of Session Activity Tolerance: Patient tolerated treatment well General Behavior During Session: [facility] for tasks performed Cognition: [facility] for tasks  performed  GP    Canda Podgorski 09/26/2012, 11:11 AM

## 2012-09-30 ENCOUNTER — Ambulatory Visit (HOSPITAL_COMMUNITY)
Admission: RE | Admit: 2012-09-30 | Discharge: 2012-09-30 | Disposition: A | Discharge status: Home / Self Care | Payer: Medicare Other | Source: Ambulatory Visit | Attending: Orthopedic Surgery | Admitting: Orthopedic Surgery

## 2012-09-30 NOTE — Progress Notes (Signed)
Physical Therapy Treatment Patient Details  Name: [name] MRN: [id number] Date of Birth: March 13, 1933  Today's Date: 09/30/2012 Time: 1027-2536 PT Time Calculation (min): 54 min  Visit#: 5  of 8   Re-eval: 10/16/12  Charge: gait 10', therex 26', manual 8', ice 10   Authorization: Medicare  Authorization Time Period:    Authorization Visit#: 5  of 10    Subjective: Symptoms/Limitations Symptoms: Pt reports she has been walking to mailbox and grocery shopping with no AD, does leave cane in car for safety but reports no falls while ambulating no AD.  Knee mainly sore today, pain scale 1-2/10.   Pain Assessment Currently in Pain?: Yes Pain Score:   2 Pain Location: Knee Pain Orientation: Left  Objective:   Exercise/Treatments Aerobic Stationary Bike: 6' @ 3.0, seat 7 Machines for Strengthening Cybex Knee Extension: 2PL BLE 15reps  Cybex Knee Flexion: 3.5 Pl 15x  Cybex Leg Press: 3.5 PL 15x PF/DF and leg press 15x  Standing Lateral Step Up: Left;15 reps;Hand Hold: 0;Step Height: 4" Forward Step Up: Left;15 reps;Hand Hold: 0;Step Height: 4" Gait Training: Gait training with no AD x 200' with cueing for knee ext with heel strike and knee flexion when on toe, equalize then increase stride length for normalized gait mechanics to reduce limp Other Standing Knee Exercises: Tandem Gait, Retro Gait, Heel and Toe Walking 1 RT each Other Standing Knee Exercises: Standing on Step 3x30 tandem stance weight on LLE Seated Other Seated Knee Exercises: 5 STS with no HHA   Modalities Modalities: Cryotherapy Manual Therapy Manual Therapy: Joint mobilization Joint Mobilization: Patellar and fibular head Grade I-III to decrease pain and improve ROM w/STM after Cryotherapy Number Minutes Cryotherapy: 10 Minutes Cryotherapy Location: Knee Type of Cryotherapy: Ice pack  Physical Therapy Assessment and Plan PT Assessment and Plan Clinical Impression Statement: Progressed therex for  overall LE strengthening and exercises to improve knee extension with gait mechanics.  Pt with most difficulty with STS no HHA due to quad and glut weakness.  Pt stated pain reduced at end of session following manual joint mobs and ice. PT Plan: Continue with current POC, encourage appropriate gait mechanics without AD and progress to outdoor ambulation with no AD. Continue focusing on functional knee extension.for gait mechanics and overall functional strengthening.     Goals    Problem List Patient Active Problem List  Diagnosis  . HYPOTHYROIDISM  . COPD  . COLITIS  . ARTHRITIS  . DIARRHEA  . ABDOMINAL PAIN -GENERALIZED  . PERSONAL HX COLONIC POLYPS  . Allergic rhinitis, seasonal  . Bloating  . Upper abdominal pain  . Instability of prosthetic knee  . Postop Acute blood loss anemia  . Difficulty in walking  . Muscle weakness (generalized)  . Tachycardia  . Chest pain    PT - End of Session Activity Tolerance: Patient tolerated treatment well General Behavior During Session: [facility] for tasks performed Cognition: [facility] for tasks performed  GP    Juel Burrow 09/30/2012, 12:47 PM

## 2012-10-02 ENCOUNTER — Ambulatory Visit (HOSPITAL_COMMUNITY): Payer: Medicare Other

## 2012-10-03 ENCOUNTER — Ambulatory Visit (HOSPITAL_COMMUNITY)
Admission: RE | Admit: 2012-10-03 | Discharge: 2012-10-03 | Disposition: A | Discharge status: Home / Self Care | Payer: Medicare Other | Source: Ambulatory Visit | Attending: Orthopedic Surgery | Admitting: Orthopedic Surgery

## 2012-10-03 NOTE — Progress Notes (Signed)
Physical Therapy Treatment Patient Details  Name: [name] MRN: [id number] Date of Birth: 12-Jun-1933  Today's Date: 10/03/2012 Time: 2956-2130 PT Time Calculation (min): 73 min  Visit#: 6  of 8   Re-eval: 10/16/12  Charge: Gait 15', therex 38', manual 8', ice 10'  Authorization: Medicare  Authorization Time Period:    Authorization Visit#: 6  of 10    Subjective: Symptoms/Limitations Symptoms: Pt with big smile walking into dept, stated she felt like last session was good, pt surprised knee was not sore following all the activities and beginning the weight machines.  Feeling stronger.  Reported soreness posterior medail region pain scale 4-5/10 today. Pain Assessment Currently in Pain?: Yes Pain Score:   4 Pain Location: Knee Pain Orientation: Left;Posterior;Medial  Objective:   Exercise/Treatments Aerobic Stationary Bike: 8' @ 4.0, seat 7 for activity tolerance and strengthening Machines for Strengthening Cybex Knee Extension: 2PL BLE 15reps  Cybex Knee Flexion: 3.5 Pl 15x  Cybex Leg Press: 3.5 PL 15x  Standing Terminal Knee Extension: Left;15 reps;Theraband Theraband Level (Terminal Knee Extension): Level 4 (Blue) Lateral Step Up: Left;15 reps;Hand Hold: 0;Step Height: 4" Forward Step Up: Left;15 reps;Hand Hold: 0;Step Height: 4" Step Down: Left;10 reps;Hand Hold: 0;Step Height: 2" Functional Squat: 15 reps SLS: R 13", L 7"  Gait Training: Gait training with no AD x 200' with cueing for knee ext with heel strike and knee flexion when on toe, equalize then increase stride length for normalized gait mechanics to reduce limp Other Standing Knee Exercises: Tandem Gait, Retro Gait, Heel and Toe Walking 2 RT each Other Standing Knee Exercises: Standing on Step 3x30 tandem stance weight on LLE Seated Other Seated Knee Exercises: 10 STS no HHA with L LE back   Modalities Modalities: Cryotherapy Manual Therapy Manual Therapy: Joint mobilization Joint  Mobilization: Patellar and fibular head Grade I-III to decrease pain and improve ROM Cryotherapy Number Minutes Cryotherapy: 10 Minutes Cryotherapy Location: Knee Type of Cryotherapy: Ice pack  Physical Therapy Assessment and Plan PT Assessment and Plan Clinical Impression Statement: Pt improving gait mechanics with less cueing for heel to toe with knee extension.  Increased activity tolerance this session, pt required no rest breaks between exercises.  Added standing TKE for quad strengthening, pt able to demonstrate proper mechanics following cues.  Pt stated joint mobs and ice helped to reduce pain at end of session. PT Plan: Continue with current POC, encourage appropriate gait mechanics without AD and progress to outdoor ambulation with no AD. Continue focusing on functional knee extension.for gait mechanics and overall functional strengthening.  Change aerobic next session to TM for gait training or elliptical for increased demand and safety to begin riding hers at home.      Goals    Problem List Patient Active Problem List  Diagnosis  . HYPOTHYROIDISM  . COPD  . COLITIS  . ARTHRITIS  . DIARRHEA  . ABDOMINAL PAIN -GENERALIZED  . PERSONAL HX COLONIC POLYPS  . Allergic rhinitis, seasonal  . Bloating  . Upper abdominal pain  . Instability of prosthetic knee  . Postop Acute blood loss anemia  . Difficulty in walking  . Muscle weakness (generalized)  . Tachycardia  . Chest pain    PT - End of Session Activity Tolerance: Patient tolerated treatment well General Behavior During Session: [facility] for tasks performed Cognition: [facility] for tasks performed  GP    Juel Burrow 10/03/2012, 4:34 PM

## 2012-10-07 ENCOUNTER — Ambulatory Visit: Payer: Medicare Other | Admitting: Cardiology

## 2012-10-07 ENCOUNTER — Ambulatory Visit (HOSPITAL_COMMUNITY)
Admission: RE | Admit: 2012-10-07 | Discharge: 2012-10-07 | Disposition: A | Discharge status: Home / Self Care | Payer: Medicare Other | Source: Ambulatory Visit

## 2012-10-07 DIAGNOSIS — M6281 Muscle weakness (generalized): Secondary | ICD-10-CM | POA: Diagnosis present

## 2012-10-07 DIAGNOSIS — M25569 Pain in unspecified knee: Secondary | ICD-10-CM | POA: Insufficient documentation

## 2012-10-07 DIAGNOSIS — Z96659 Presence of unspecified artificial knee joint: Secondary | ICD-10-CM | POA: Insufficient documentation

## 2012-10-07 DIAGNOSIS — R262 Difficulty in walking, not elsewhere classified: Secondary | ICD-10-CM | POA: Diagnosis present

## 2012-10-07 DIAGNOSIS — M25669 Stiffness of unspecified knee, not elsewhere classified: Secondary | ICD-10-CM | POA: Insufficient documentation

## 2012-10-07 NOTE — Progress Notes (Signed)
Physical Therapy Treatment Patient Details  Name: [name] MRN: [id number] Date of Birth: 1933/07/07  Today's Date: 10/07/2012 Time: 6789-3810 PT Time Calculation (min): 44 min  Visit#: 7  of 8   Re-eval: 10/16/12 Assessment Diagnosis: L TKR Surgical Date: 08/06/12 Next MD Visit: Dr. Maureen Ralphs - January 16th Prior Therapy: SNF and HHPT Charge: gait 8', therex 26', ice 10'   Authorization: Medicare  Authorization Time Period:    Authorization Visit#: 7  of 10    Subjective: Symptoms/Limitations Symptoms: Pt stated she is having lots of difficulty sleeping at night, not related to pain. Pain Assessment Currently in Pain?: No/denies  Objective:   Exercise/Treatments Stretches Gastroc Stretch: 3 reps;30 seconds Aerobic Tread Mill: Gait training x 8' @ .55 cyc/sec Standing Terminal Knee Extension: Left;15 reps;Theraband Theraband Level (Terminal Knee Extension): Level 4 (Blue) Lateral Step Up: Left;15 reps;Hand Hold: 0;Step Height: 4" Forward Step Up: Left;15 reps;Hand Hold: 0;Step Height: 4" Step Down: Left;10 reps;Hand Hold: 0;Step Height: 2" SLS: R 12", L 8" Other Standing Knee Exercises: Tandem stance 3x 30", tandem gait x 1, Retro gait 1 RT, side stepping withx 1RT Seated Other Seated Knee Exercises: heel roll out 10x 10" Other Seated Knee Exercises: 10 STS no HHA with L LE back   Modalities Modalities: Cryotherapy Cryotherapy Number Minutes Cryotherapy: 10 Minutes Cryotherapy Location: Knee Type of Cryotherapy: Ice pack  Physical Therapy Assessment and Plan PT Assessment and Plan Clinical Impression Statement: Began gait trainer on TM for improved gait mechanics, pt able to demonstrate improved gait mechanics through session with min cueing for heel to toe with knee extension.  Added glut and hip strengthening exercises to improve gait mechanics as well, pt able to demonstrate with good form following cues.   PT Plan: Re-eval next session.    Goals    Problem List Patient Active Problem List  Diagnosis  . HYPOTHYROIDISM  . COPD  . COLITIS  . ARTHRITIS  . DIARRHEA  . ABDOMINAL PAIN -GENERALIZED  . PERSONAL HX COLONIC POLYPS  . Allergic rhinitis, seasonal  . Bloating  . Upper abdominal pain  . Instability of prosthetic knee  . Postop Acute blood loss anemia  . Difficulty in walking  . Muscle weakness (generalized)  . Tachycardia  . Chest pain    PT - End of Session Activity Tolerance: Patient tolerated treatment well General Behavior During Session: [facility] for tasks performed Cognition: [facility] for tasks performed  GP    Aldona Lento 10/07/2012, 2:28 PM

## 2012-10-09 ENCOUNTER — Ambulatory Visit (HOSPITAL_COMMUNITY): Payer: Medicare Other | Admitting: Physical Therapy

## 2012-10-10 ENCOUNTER — Ambulatory Visit (HOSPITAL_COMMUNITY)
Admission: RE | Admit: 2012-10-10 | Discharge: 2012-10-10 | Disposition: A | Discharge status: Home / Self Care | Payer: Medicare Other | Source: Ambulatory Visit | Attending: Orthopedic Surgery | Admitting: Orthopedic Surgery

## 2012-10-10 DIAGNOSIS — IMO0001 Reserved for inherently not codable concepts without codable children: Secondary | ICD-10-CM | POA: Insufficient documentation

## 2012-10-10 DIAGNOSIS — M25569 Pain in unspecified knee: Secondary | ICD-10-CM | POA: Insufficient documentation

## 2012-10-10 DIAGNOSIS — M6281 Muscle weakness (generalized): Secondary | ICD-10-CM | POA: Insufficient documentation

## 2012-10-10 DIAGNOSIS — R269 Unspecified abnormalities of gait and mobility: Secondary | ICD-10-CM | POA: Insufficient documentation

## 2012-10-10 NOTE — Progress Notes (Signed)
Physical Therapy Re-evaluation/treatment  Patient Details  Name: [name] MRN: [id number] Date of Birth: Jul 25, 1933  Today's Date: 10/10/2012 Time: 1110-1215 PT Time Calculation (min): 65 min  Visit#: 8  of 16   Re-eval: 11/07/12 Assessment Diagnosis: L TKR Surgical Date: 08/06/12 Next MD Visit: Dr. Despina Hick - January 16th Prior Therapy: SNF and HHPT Charge: MMT x 1, ROM Measurement x 1, Self care x 8 min (ABC, discussion with importance on HEP), therex 38', ice 10'  Authorization: Medicare  Authorization Time Period: G-code complete 8th visit with re-eval  Authorization Visit#: 8  of 18    Subjective Symptoms/Limitations Symptoms: Patient complains of knee being uncomfortable with activity until knee "warms up".  How long can you sit comfortably?: able to sit for a 2 1/2 hour movie comfortably How long can you stand comfortably?: 20 minutes How long can you walk comfortably?: 20 minutes Pain Assessment Currently in Pain?: No/denies  Objective:   Assessment LLE AROM (degrees) Left Knee Extension: 3  (was 9) Left Knee Flexion: 120  (was 111) LLE PROM (degrees) Left Knee Extension: 1 (was 4) Left Knee Flexion: 122 (was 111) LLE Strength Left Hip Flexion: 4/5 (was 3+/5 ) Left Hip Extension: 3+/5 (was 3-/5) Left Hip ABduction: 4/5 (was 4/5) Left Knee Flexion: 4/5 (was 4/5) Left Knee Extension:  (4+/5 was 4/5)  Exercise/Treatments Stretches Active Hamstring Stretch: 3 reps;30 seconds Gastroc Stretch: 3 reps;30 seconds Aerobic Tread Mill: Gait training x 10' @ .55 cyc/sec Machines for Strengthening Cybex Knee Extension: 2PL BLE 15reps  Cybex Knee Flexion: 3.5 Pl 15x  Cybex Leg Press: 3.5 PL 15x  Seated Other Seated Knee Exercises: 10 STS no HHA with L LE back   Modalities Modalities: Cryotherapy Manual Therapy Manual Therapy: Joint mobilization Joint Mobilization: Patellar and fibular head Grade I-III to decrease pain and improve ROM   Cryotherapy Number Minutes Cryotherapy: 10 Minutes Cryotherapy Location: Knee Type of Cryotherapy: Ice pack  Physical Therapy Assessment and Plan PT Assessment and Plan Clinical Impression Statement: Re-eval complete. Mrs. Hobday has had 8 OPPT sessions over 4 weeks with the following findings: Pt has met 2/4 STG and 1/3 LTGs with increased functional strrength, pt able to demonstrate 5 STS with no HHA in 21.5", static balance has improved though still does require assistance for LOB episodes, AROM has imporoved to 3-120 and pt has improved her perceived functional abilities to 75% confidence score from the ABC scale with ability to sit through a movie comfortably and walk for 20 minutes without increased pain PT Plan: Recommend continues OPPT for 4 more weeks to improve functional strength, gait mechanics and balance.    Goals  Goals Home Exercise Program Pt will Perform Home Exercise Program: Independently PT Goal: Perform Home Exercise Program - Progress: Met PT Short Term Goals Time to Complete Short Term Goals: 2 weeks PT Short Term Goal 1: Pt will improve LE strength by 1 muscle grade.  PT Short Term Goal 1 - Progress: Progressing toward goal PT Short Term Goal 2: Pt will improve static standing balance and demonstrate 30 sec L SLS on solid surface.  PT Short Term Goal 2 - Progress: Progressing toward goal PT Short Term Goal 3: Pt will report pain less than 3/10 with therapy sessions.  PT Short Term Goal 3 - Progress: Met PT Short Term Goal 4: Pt will improve L knee AROM: 4-115 degrees.  PT Short Term Goal 4 - Progress: Met (AROM: 3-120) PT Long Term Goals Time to Complete Long Term  Goals: 4 weeks PT Long Term Goal 1: Pt will improve her ABC to 80% for improved percieved functional ability.  PT Long Term Goal 1 - Progress: Progressing toward goal (ABC 75%) PT Long Term Goal 2: Pt will improve LE strength in order to ambulate independently outdoors for 1 hour to conitnue with  shopping activities.  PT Long Term Goal 2 - Progress: Progressing toward goal Long Term Goal 3: Pt will improve LE functional strength to [facility] in order to complete 5 STS in 30 seconds.  Long Term Goal 3 Progress: Met (5 STS 21.5 no HHA)   Problem List Patient Active Problem List  Diagnosis  . HYPOTHYROIDISM  . COPD  . COLITIS  . ARTHRITIS  . DIARRHEA  . ABDOMINAL PAIN -GENERALIZED  . PERSONAL HX COLONIC POLYPS  . Allergic rhinitis, seasonal  . Bloating  . Upper abdominal pain  . Instability of prosthetic knee  . Postop Acute blood loss anemia  . Difficulty in walking  . Muscle weakness (generalized)  . Tachycardia  . Chest pain  . Total knee replacement status  . Knee pain  . Knee stiffness    PT - End of Session Activity Tolerance: Patient tolerated treatment well General Behavior During Session: [facility] for tasks performed Cognition: [facility] for tasks performed  GP Functional Assessment Tool Used: ABC 75% (was 52.5% on 09/16/2012) Functional Limitation: Mobility: Walking and moving around Mobility: Walking and Moving Around Current Status [phone]): At least 20 percent but less than 40 percent impaired, limited or restricted Mobility: Walking and Moving Around Goal Status [phone]): At least 1 percent but less than 20 percent impaired, limited or restricted  Juel Burrow 10/10/2012, 7:45 PM

## 2012-10-14 ENCOUNTER — Ambulatory Visit (HOSPITAL_COMMUNITY)
Admission: RE | Admit: 2012-10-14 | Discharge: 2012-10-14 | Disposition: A | Discharge status: Home / Self Care | Payer: Medicare Other | Source: Ambulatory Visit | Attending: Orthopedic Surgery | Admitting: Orthopedic Surgery

## 2012-10-14 NOTE — Progress Notes (Addendum)
Physical Therapy Treatment Patient Details  Name: YEIMY BRABANT MRN: [id number] Date of Birth: Jan 14, 1933  Today's Date: 10/14/2012 Time: 4696-2952 PT Time Calculation (min): 57 min Charges: 23' NMR, 20' TE, 10' Manual  Visit#: 9  of 16   Re-eval: 11/07/12    Authorization: Medicare  Authorization Time Period: G-code complete 8th visit with re-eval   Authorization Visit#: 8  of 18    Subjective: Symptoms/Limitations Symptoms: I feel like my knee gets really sore every time we do something new.  I have started going on my elliptical at home and it is going pretty well.  Pain Assessment Currently in Pain?: No/denies  Exercise/Treatments Aerobic Tread Mill: Gait training x 10' @ .66-.80 cyc/sec w/TC and VC to decrease L pelvic posterior rotation Standing Forward Step Up: Left;10 reps;Hand Hold: 0;Step Height: 4" Rocker Board: 2 minutes SLS: 3x30 sec hold Other Standing Knee Exercises: hip hikes 4 inch step x10 Supine Straight Leg Raises: 15 reps Straight Leg Raises Limitations: 2# Sidelying Hip ABduction: Left;2 sets;10 reps;Limitations Hip ABduction Limitations: 2# Prone  Hamstring Curl: 20 reps;Limitations Hamstring Curl Limitations: 4# Hip Extension: Left;15 reps;Limitations Hip Extension Limitations: 2# Other Prone Exercises: TKE x10 5 sec hold Standing Step Over Hurdles / Cones: 6 and 12 inch w/R and L leg lead 2 RT each w/min A  Numbers 1-15: Balance Beam;2 reps     Modalities Modalities: Cryotherapy Manual Therapy Manual Therapy: Joint mobilization Joint Mobilization: Patellar and fibular head Grade I-IV to decrease pain and improve ROM  Cryotherapy Number Minutes Cryotherapy: 5 Minutes Cryotherapy Location: Knee Type of Cryotherapy: Ice pack  Physical Therapy Assessment and Plan PT Assessment and Plan Clinical Impression Statement: Treatment consisted of improving overall confidence with balance and encouraging appropriate gait mechanics.  Pt needs  encourage to ambulate on TM w/o UE A and reports increased fatigue when walking at normal range for her age.   Pt encouraged to continue with HEP 2-3x a day and focus on hip abd strength in order to ambualte with approrpaite mechanics.  PT Plan: Continue to improve confidence with balance and improve LE strength in order to reach functional goals.     Goals    Problem List Patient Active Problem List  Diagnosis  . HYPOTHYROIDISM  . COPD  . COLITIS  . ARTHRITIS  . DIARRHEA  . ABDOMINAL PAIN -GENERALIZED  . PERSONAL HX COLONIC POLYPS  . Allergic rhinitis, seasonal  . Bloating  . Upper abdominal pain  . Instability of prosthetic knee  . Postop Acute blood loss anemia  . Difficulty in walking  . Muscle weakness (generalized)  . Tachycardia  . Chest pain  . Total knee replacement status  . Knee pain  . Knee stiffness    PT Plan of Care PT Patient Instructions: discussed importance of hip abd strength to help with balance and appr Consulted and Agree with Plan of Care: Patient  GP    Bessie Boyte, PT 10/14/2012, 3:29 PM

## 2012-10-17 ENCOUNTER — Ambulatory Visit (HOSPITAL_COMMUNITY)
Admission: RE | Admit: 2012-10-17 | Discharge: 2012-10-17 | Disposition: A | Discharge status: Home / Self Care | Payer: Medicare Other | Source: Ambulatory Visit | Attending: Orthopedic Surgery | Admitting: Orthopedic Surgery

## 2012-10-17 NOTE — Progress Notes (Signed)
Physical Therapy Treatment Patient Details  Name: [name] MRN: [id number] Date of Birth: 1933-04-03  Today's Date: 10/17/2012 Time: 4098-1191 PT Time Calculation (min): 55 min  Visit#: 10  of 16   Re-eval: 11/07/12  Charge: Gait 10', therex 23', NMR 12' ice 10'  Authorization: Medicare  Authorization Time Period: G-code complete 8th visit with re-eval  Authorization Visit#: 10  of 18    Subjective: Symptoms/Limitations Symptoms: I feel like the weather is part of the reason I have been in so much pain.  I was at the insurance place following riding on my elliptical for 15 minutes and for the first time ever I felt like my knee was going to give out,  Also I noticed how weak my legs actually are, went to my daugther's house and tried to climb her 6 steps into the house the normal way and noticed how difficult it was from my weak leg. Pain Assessment Currently in Pain?: Yes Pain Score:   5 Pain Location: Knee Pain Orientation: Left  Objective:   Exercise/Treatments Aerobic Tread Mill: Gait training x 10' @ .66-.80 cyc/sec w/TC and VC to decrease L pelvic posterior rotation Standing Lateral Step Up: Left;10 reps;Hand Hold: 0;Step Height: 4";Step Height: 2" Forward Step Up: Left;10 reps;Hand Hold: 1;Step Height: 6" Step Down: Left;10 reps;Hand Hold: 1;Step Height: 4" Rocker Board: 3 minutes;Limitations Rocker Board Limitations: R/L Other Standing Knee Exercises: hip hikes 4 inch step x10 Other Standing Knee Exercises: Hurdle 6 and 12 in step up with L leading over 12 in and R leading over 12 2RT   Modalities Modalities: Cryotherapy Cryotherapy Number Minutes Cryotherapy: 10 Minutes Cryotherapy Location: Knee Type of Cryotherapy: Ice pack  Physical Therapy Assessment and Plan PT Assessment and Plan Clinical Impression Statement: Session focus on improving functional strength and balance to increase overall confidence with functional activities and improve gait  mechanics.  Able to increase step up height for more normal step height, pt able to complete with 1 HHA.  Pt stated pain free at end of session. PT Plan: Continue to improve confidence with balance and improve LE strength in order to reach functional goals    Goals    Problem List Patient Active Problem List  Diagnosis  . HYPOTHYROIDISM  . COPD  . COLITIS  . ARTHRITIS  . DIARRHEA  . ABDOMINAL PAIN -GENERALIZED  . PERSONAL HX COLONIC POLYPS  . Allergic rhinitis, seasonal  . Bloating  . Upper abdominal pain  . Instability of prosthetic knee  . Postop Acute blood loss anemia  . Difficulty in walking  . Muscle weakness (generalized)  . Tachycardia  . Chest pain  . Total knee replacement status  . Knee pain  . Knee stiffness    PT - End of Session Activity Tolerance: Patient tolerated treatment well General Behavior During Session: [facility] for tasks performed Cognition: [facility] for tasks performed  GP    Juel Burrow 10/17/2012, 3:37 PM

## 2012-10-21 ENCOUNTER — Ambulatory Visit (INDEPENDENT_AMBULATORY_CARE_PROVIDER_SITE_OTHER): Payer: Medicare Other | Admitting: Cardiology

## 2012-10-21 ENCOUNTER — Ambulatory Visit (HOSPITAL_COMMUNITY)
Admission: RE | Admit: 2012-10-21 | Discharge: 2012-10-21 | Disposition: A | Discharge status: Home / Self Care | Payer: Medicare Other | Source: Ambulatory Visit | Attending: Orthopedic Surgery | Admitting: Orthopedic Surgery

## 2012-10-21 ENCOUNTER — Encounter: Payer: Self-pay | Admitting: Cardiology

## 2012-10-21 VITALS — BP 124/70 | HR 86 | Ht 65.0 in | Wt 176.0 lb

## 2012-10-21 DIAGNOSIS — R079 Chest pain, unspecified: Secondary | ICD-10-CM

## 2012-10-21 DIAGNOSIS — R000 Tachycardia, unspecified: Secondary | ICD-10-CM

## 2012-10-21 NOTE — Progress Notes (Signed)
Patient ID: [name], female   DOB: 1932-12-03, 79 y.o.   MRN: [id number] PCP: Dr. Willey Blade  79 yo with history of psoriatic arthritis, lymphocytic colitis, and COPD presented initially for cardiology evaluation of chest pain. Patient was in the ER on 12/6 for chest pressure.  She woke up that morning with chest pressure, nausea, and diaphoresis.  This lasted for about 2 hours.  Workup in the ER was negative.  Symptoms gradually subsided.  She has not had symptoms like this before or since that episode.     She also has been noted to have sinus tachycardia.  Her HR will rise to the 130s with minimal exercise when she works with PT (recent knee replacement).  At rest today, HR is 86.  No lightheaded spells, no syncope.  She does fairly well on flat ground but gets short of breath climbing a flight of steps.    We did a Lexiscan Myoview last month that showed normal EF and no evidence for ischemia or infarction.   ECG: NSR, normal  Labs (12/13): TnI negative x 2, TSH normal, LDL 59  PMH: 1. Lymphocytic colitis 2. Hypothyroidism 3. Psoriatic arthritis with h/o MTX use.  4. OA s/p TKR 5. COPD with h/o exacerbations.  Prior smoker.  6. GERD with hiatal hernia 7. CCY 8. Depression 9. Chest pain: Lexiscan myoview (12/13) with EF 75%, apical thinning but no ischemia or infarction.   SH: Former smoker, quit in 1982, lives alone.   FH: Father with PCM, brother with MI.   Current Outpatient Prescriptions  Medication Sig Dispense Refill  . acetaminophen (TYLENOL) 325 MG tablet Take 2 tablets (650 mg total) by mouth every 6 (six) hours as needed (or Fever >/= 101).  60 tablet  0  . ALPRAZolam (XANAX) 0.5 MG tablet Take 0.25-0.5 mg by mouth at bedtime as needed. Sleep      . betamethasone dipropionate (DIPROLENE) 0.05 % ointment Apply 1 application topically 2 (two) times daily.      . diphenoxylate-atropine (LOMOTIL) 2.5-0.025 MG per tablet Take 1-2 tablets by mouth 3 (three) times daily as  needed. diarrhea      . levalbuterol (XOPENEX HFA) 45 MCG/ACT inhaler Inhale 1-2 puffs into the lungs every 4 (four) hours as needed. Wheezing      . levothyroxine (SYNTHROID, LEVOTHROID) 112 MCG tablet Take 112 mcg by mouth daily before breakfast.       . methocarbamol (ROBAXIN) 500 MG tablet Take 1 tablet (500 mg total) by mouth every 6 (six) hours as needed.  80 tablet  0  . methylcellulose (ARTIFICIAL TEARS) 1 % ophthalmic solution Place 1 drop into both eyes 2 (two) times daily as needed. Dry eyes      . oxyCODONE (OXY IR/ROXICODONE) 5 MG immediate release tablet Take 1-2 tablets (5-10 mg total) by mouth every 3 (three) hours as needed.  80 tablet  0  . tiotropium (SPIRIVA) 18 MCG inhalation capsule Place 18 mcg into inhaler and inhale daily.      . traMADol (ULTRAM) 50 MG tablet Take 1-2 tablets (50-100 mg total) by mouth every 6 (six) hours as needed (mild pain).  60 tablet  0  . [DISCONTINUED] Calcium Carbonate (CALCIUM 600) 1500 MG TABS Take 2 tablets by mouth daily.          BP 124/70  Pulse 86  Ht 5' 5"  (1.651 m)  Wt 176 lb (79.833 kg)  BMI 29.29 kg/m2  SpO2 97% General: NAD Neck: No JVD,  no thyromegaly or thyroid nodule.  Lungs: Prolonged respiratory phase CV: Nondisplaced PMI.  Heart regular S1/S2, no S3/S4, no murmur.  No peripheral edema.  No carotid bruit.  Normal pedal pulses.  Abdomen: Soft, nontender, no hepatosplenomegaly, no distention.   Neurologic: Alert and oriented x 3.  Psych: Normal affect. Extremities: No clubbing or cyanosis.   Assessment/Plan: 1. Chest pain: Atypical with one prolonged episode at rest on 12/6.  No further chest pain.  Risk factors include family history (brother with MI) and prior smoking.  Lexiscan myoview was normal in 12/13.  no further workup needed at this time.  2. Tachycardia: I suspect that Ms [place]'s occasional sinus tachycardia is a normal response to exercise, even minimal, in a deconditioned patient.  HR is appropriate at rest,  in the 80s.    [name] 10/21/2012

## 2012-10-21 NOTE — Progress Notes (Signed)
Physical Therapy Treatment Patient Details  Name: [name] MRN: [id number] Date of Birth: 1933-08-19  Today's Date: 10/21/2012 Time: 4098-1191 PT Time Calculation (min): 39 min  Visit#: 11  of 16   Re-eval: 11/07/12 Charges: Gait x 10' Therex x 18' MMT x 1 ROMM X 1   Authorization: Medicare  Authorization Time Period: G-code complete 8th visit with re-eval  Authorization Visit#: 11  of 18    Subjective: Symptoms/Limitations Symptoms: Pt states that transition from sit to stand is not as painful as it once was. Pain Assessment Currently in Pain?: Yes Pain Score:   2 Pain Location: Knee Pain Orientation: Left   Objective: LLE AROM (degrees) Left Knee Extension: 3  (was 3) Left Knee Flexion: 124  (was 120) LLE PROM (degrees) Left Knee Extension: 0 (was ) Left Knee Flexion: 125 (was 122) LLE Strength Left Hip Flexion: 4/5 (was4/5) Left Hip Extension: 4/5 (was 3+/5) Left Hip ABduction:  (4+/5 was 4/5) Left Knee Flexion: 5/5 (was 4/5) Left Knee Extension: 5/5 (was 4+/5)  Exercise/Treatments Aerobic Tread Mill: Gait training x 10' @ .66-.80 cyc/sec w/TC and VC to decrease L pelvic posterior rotation Standing Lateral Step Up: Left;10 reps;Step Height: 4";Step Height: 2";Hand Hold: 2 Forward Step Up: Left;10 reps;Hand Hold: 1;Step Height: 6" Step Down: Left;10 reps;Hand Hold: 1;Step Height: 4" Rocker Board: 3 minutes;Limitations Rocker Board Limitations: R/L  Physical Therapy Assessment and Plan PT Assessment and Plan Clinical Impression Statement: Pt requires multimodal cueing for proper form with step ups. Pt presents with improvements in strength and ROM. Progress note sent to MD prior to appointment. Pt declines ice and states that she will ice her knee once she gets home. PT Plan: Continue to improve confidence with balance and improve LE strength in order to reach functional goals.     Problem List Patient Active Problem List  Diagnosis  . HYPOTHYROIDISM   . COPD  . COLITIS  . ARTHRITIS  . DIARRHEA  . ABDOMINAL PAIN -GENERALIZED  . PERSONAL HX COLONIC POLYPS  . Allergic rhinitis, seasonal  . Bloating  . Upper abdominal pain  . Instability of prosthetic knee  . Postop Acute blood loss anemia  . Difficulty in walking  . Muscle weakness (generalized)  . Tachycardia  . Chest pain  . Total knee replacement status  . Knee pain  . Knee stiffness    PT - End of Session Activity Tolerance: Patient tolerated treatment well General Behavior During Session: [facility] for tasks performed Cognition: [facility] for tasks performed  Seth Bake, PTA 10/21/2012, 3:33 PM

## 2012-10-24 ENCOUNTER — Ambulatory Visit (HOSPITAL_COMMUNITY): Payer: Medicare Other

## 2012-10-27 ENCOUNTER — Ambulatory Visit (HOSPITAL_COMMUNITY)
Admission: RE | Admit: 2012-10-27 | Discharge: 2012-10-27 | Disposition: A | Discharge status: Home / Self Care | Payer: Medicare Other | Source: Ambulatory Visit | Attending: Internal Medicine | Admitting: Internal Medicine

## 2012-10-27 ENCOUNTER — Other Ambulatory Visit (HOSPITAL_COMMUNITY): Payer: Self-pay | Admitting: Internal Medicine

## 2012-10-27 DIAGNOSIS — M25476 Effusion, unspecified foot: Secondary | ICD-10-CM | POA: Insufficient documentation

## 2012-10-27 DIAGNOSIS — M79672 Pain in left foot: Secondary | ICD-10-CM

## 2012-10-27 DIAGNOSIS — M25579 Pain in unspecified ankle and joints of unspecified foot: Secondary | ICD-10-CM | POA: Insufficient documentation

## 2012-10-27 DIAGNOSIS — M7730 Calcaneal spur, unspecified foot: Secondary | ICD-10-CM | POA: Insufficient documentation

## 2012-10-27 DIAGNOSIS — M25473 Effusion, unspecified ankle: Secondary | ICD-10-CM | POA: Insufficient documentation

## 2012-10-28 ENCOUNTER — Ambulatory Visit (HOSPITAL_COMMUNITY): Payer: Medicare Other

## 2012-10-31 ENCOUNTER — Ambulatory Visit (HOSPITAL_COMMUNITY): Payer: Medicare Other

## 2012-11-04 ENCOUNTER — Ambulatory Visit (HOSPITAL_COMMUNITY): Payer: Medicare Other

## 2012-11-07 ENCOUNTER — Ambulatory Visit (HOSPITAL_COMMUNITY): Payer: Medicare Other | Admitting: Physical Therapy

## 2012-12-25 ENCOUNTER — Encounter: Payer: Self-pay | Admitting: Emergency Medicine

## 2012-12-25 ENCOUNTER — Ambulatory Visit (INDEPENDENT_AMBULATORY_CARE_PROVIDER_SITE_OTHER): Payer: Medicare Other | Admitting: Emergency Medicine

## 2012-12-25 VITALS — BP 118/72 | HR 76 | Temp 98.7°F | Ht 65.0 in | Wt 177.2 lb

## 2012-12-25 DIAGNOSIS — J449 Chronic obstructive pulmonary disease, unspecified: Secondary | ICD-10-CM

## 2012-12-25 MED ORDER — TIOTROPIUM BROMIDE MONOHYDRATE 18 MCG IN CAPS: 18.0000 ug | ORAL_CAPSULE | Freq: Every day | RESPIRATORY_TRACT | Status: DC

## 2012-12-25 NOTE — Patient Instructions (Addendum)
Please contineu your Spiriva daily Start Breo once a day We will perform walking oximetry today Follow with Dr Lamonte Sakai in 6 weeks or sooner if you have any problems

## 2012-12-25 NOTE — Progress Notes (Signed)
Subjective:    Patient ID: [name], female    DOB: 10/10/1932, 79 y.o.   MRN: [id number] HPI 79 yo former smoker (20 pk-yrs), hx of lymphocytic colitis/IBS on prednisone, hypothyroidism, psoriatic arthritis formerly on MTX, OA s/p knee replacement. Presents for evaluation of slowly progressive dyspnea. Bothers her with walking, climbing stairs. Also mentions frequent bouts of bronchitis with apparent associated bronchospasm. Had an acute bronchitis in 2/11, treated with pred + abx + SABA (xopenex due to tachycardia with albuterol). Then started on Spiriva. Now better but still SOB with exertion.   ROV 01/31/10 -- returns for f/u and after PFTs. Feels that she has benefitted from the Spiriva, her exertional symptoms and nighttime symptoms are much improved. Has had PFTs as below.   ROV 08/09/10 -- f/u for COPD (moderate AFL on spiro w no BD response). She was on Spiriva and felt that it helped her breathing, but it bothered her UA - made it dry, hoarse, throat burning. No allergy symptoms. She has been taking the xopenex. C/o some soreness on tongue, ? thrush.   ROV 10/17/10 -- moderate COPD, had been off pred for IBS/colitis since our last visit, just restarted by Dr Henrene Pastor. She doesn't think her breathing worsened off the pred. Tried Symbicort but thout the Spiriva helped her breathing more. She didn't have any side effects from the Symbicort. She is interested in retrying the Spiriva to see if she can tolerate. No flares or exacerbations since last visit. Rare xopenex use.  ROV 02/15/11 -- returns for COPD. We have tried both Symbicort and Spiriva. Last time we went back on Spiriva  - she believes that it has helped, but she is having some hoarse voice. Believes that it is due to Spiriva + a worsening of her allergies. Started on zyrtec in March with some relief but still bothering her. Since last visit Prednisone has been restarted. Had emergency sgy for cholecystitis in February. Having GERD sx  at night when she lays down.   ROV 06/19/11 -- follow up for COPD. She is tolerating the spiriva. Her prednisone was weaned to off in June, hasn't been restarted, she has f/u w Dr Henrene Pastor today. She rarely uses xopenex. No flares since last visit. She does still have some exertional sob.   ROV 12/18/11 -- COPD, also w hx of IBS/colitis followed by Dr Henrene Pastor. Currently on Spiriva, has xopenex to use prn - uses rarely. Still gets SOB with walking or with stairs. She thinks Spiriva helps but it also can make her throat hoarse. Not on anti-inflammatory meds at this time.   ROV 06/17/12 -- COPD, also w hx of IBS/colitis followed by Dr Henrene Pastor. She is using Spiriva. She has had more trouble with allergies and associated cough over the last several months. Not currently on zyrtec. She did benefit from chlorpheniramine. Rare SABA use.   07/28/2012 Acute OV  Complains of dry cough, chest congestion, increased SOB, wheezing, tightness in chest, head congestion w/ clear drainage, PND x4 days .  Has upcoming surgery next week - redo of knee replacement w/ ortho No hemoptysis or chest pain. No edema.  No recent abx use.  >>Omnicef x 7 d   Acute OV 08/04/12 --  Patient returns for persistent cough and congestion. The patient was seen 1 week ago for bronchitis and treated with 7 days of Omnicef. She has upcoming surgery on October 30 for a knee replacement She states that she is feeling much better but still has some  sinus drainage, congestion, and cough. She denies any discolored mucus or fever or chest pain, shortness of breath, or edema CXR last week w/ no acute process    ROV 12/25/12 -- COPD, also w hx of IBS/colitis followed by Dr Henrene Pastor. She returns for regular f/u. Treated in Oct 13 for AE as above. She is starting gto have allergy sx for last 2 weeks, bad at night. She is back on her zyrtec. She believes that her breathing is a bit more limiting than a year ago, SOB w exertion and with lifting objects. She  has tried symbicort before.    PULMONARY FUNCTON TEST 01/18/2010  FVC 2.54  FEV1 1.19  FEV1/FVC 46.9  FVC  % Predicted 97  FEV % Predicted 65  FeF 25-75 .37  FeF 25-75 % Predicted 2.11      Objective:   Physical Exam Filed Vitals:   12/25/12 1523  BP: 118/72  Pulse: 76  Temp: 98.7 F (37.1 C)   Gen: Pleasant, overwt, in no distress,  normal affect  ENT: No lesions,  mouth clear,  oropharynx clear, no postnasal drip  Neck: No JVD, no TMG, no carotid bruits  Lungs: No use of accessory muscles, no dullness to percussion, CTA   Cardiovascular: RRR, heart sounds normal, no murmur or gallops, no peripheral edema  Musculoskeletal: No deformities, no cyanosis or clubbing  Neuro: alert, non focal  Skin: Warm, no lesions or rashes   Assessment & Plan:  COPD Seems to be worse over the last year, would like to add LABA to Spiriva - refill spiriva - trial Breo qd - walking oximetry - rov 6 weeks

## 2012-12-25 NOTE — Assessment & Plan Note (Signed)
Seems to be worse over the last year, would like to add LABA to Spiriva - refill spiriva - trial Breo qd - walking oximetry - rov 6 weeks

## 2013-02-05 ENCOUNTER — Encounter: Payer: Self-pay | Admitting: Emergency Medicine

## 2013-02-05 ENCOUNTER — Ambulatory Visit (INDEPENDENT_AMBULATORY_CARE_PROVIDER_SITE_OTHER): Payer: Medicare Other | Admitting: Emergency Medicine

## 2013-02-05 VITALS — BP 114/68 | HR 94 | Temp 97.4°F | Ht 65.0 in | Wt 179.6 lb

## 2013-02-05 DIAGNOSIS — J449 Chronic obstructive pulmonary disease, unspecified: Secondary | ICD-10-CM

## 2013-02-05 DIAGNOSIS — J309 Allergic rhinitis, unspecified: Secondary | ICD-10-CM

## 2013-02-05 DIAGNOSIS — J302 Other seasonal allergic rhinitis: Secondary | ICD-10-CM

## 2013-02-05 MED ORDER — FLUTICASONE PROPIONATE 50 MCG/ACT NA SUSP: 2.0000 | Freq: Two times a day (BID) | NASAL | Status: DC

## 2013-02-05 NOTE — Assessment & Plan Note (Signed)
-   restarted zyrtec  - will start fluticasone bid - chlortrimeton

## 2013-02-05 NOTE — Addendum Note (Signed)
Addended by: Collene Gobble on: 02/05/2013 01:52 PM   Modules accepted: Orders

## 2013-02-05 NOTE — Assessment & Plan Note (Signed)
-   stay on spiriva  - stop Breo - xopenex prn

## 2013-02-05 NOTE — Patient Instructions (Addendum)
Please stop Breo for now. We may retry it at some point in the future Continue spiriva Use xopenex as needed Use your zyrtec daily and chlorpheniramine (OTC) as needed for congestion Restart fluticasone nasal spray, 2 sprays each nostril twice a day Follow with Dr Lamonte Sakai in 6 months or sooner if you have any problems

## 2013-02-05 NOTE — Progress Notes (Signed)
Subjective:    Patient ID: [name], female    DOB: October 28, 1932, 79 y.o.   MRN: [id number] HPI 79 yo former smoker (20 pk-yrs), hx of lymphocytic colitis/IBS on prednisone, hypothyroidism, psoriatic arthritis formerly on MTX, OA s/p knee replacement. Presents for evaluation of slowly progressive dyspnea. Bothers her with walking, climbing stairs. Also mentions frequent bouts of bronchitis with apparent associated bronchospasm. Had an acute bronchitis in 2/11, treated with pred + abx + SABA (xopenex due to tachycardia with albuterol). Then started on Spiriva. Now better but still SOB with exertion.   ROV 01/31/10 -- returns for f/u and after PFTs. Feels that she has benefitted from the Spiriva, her exertional symptoms and nighttime symptoms are much improved. Has had PFTs as below.   ROV 08/09/10 -- f/u for COPD (moderate AFL on spiro w no BD response). She was on Spiriva and felt that it helped her breathing, but it bothered her UA - made it dry, hoarse, throat burning. No allergy symptoms. She has been taking the xopenex. C/o some soreness on tongue, ? thrush.   ROV 10/17/10 -- moderate COPD, had been off pred for IBS/colitis since our last visit, just restarted by Dr [name]. She doesn't think her breathing worsened off the pred. Tried Symbicort but thout the Spiriva helped her breathing more. She didn't have any side effects from the Symbicort. She is interested in retrying the Spiriva to see if she can tolerate. No flares or exacerbations since last visit. Rare xopenex use.  ROV 02/15/11 -- returns for COPD. We have tried both Symbicort and Spiriva. Last time we went back on Spiriva  - she believes that it has helped, but she is having some hoarse voice. Believes that it is due to Spiriva + a worsening of her allergies. Started on zyrtec in March with some relief but still bothering her. Since last visit Prednisone has been restarted. Had emergency sgy for cholecystitis in February. Having GERD sx  at night when she lays down.   ROV 06/19/11 -- follow up for COPD. She is tolerating the spiriva. Her prednisone was weaned to off in June, hasn't been restarted, she has f/u w Dr [name] today. She rarely uses xopenex. No flares since last visit. She does still have some exertional sob.   ROV 12/18/11 -- COPD, also w hx of IBS/colitis followed by Dr [name]. Currently on Spiriva, has xopenex to use prn - uses rarely. Still gets SOB with walking or with stairs. She thinks Spiriva helps but it also can make her throat hoarse. Not on anti-inflammatory meds at this time.   ROV 06/17/12 -- COPD, also w hx of IBS/colitis followed by Dr [name]. She is using Spiriva. She has had more trouble with allergies and associated cough over the last several months. Not currently on zyrtec. She did benefit from chlorpheniramine. Rare SABA use.   07/28/2012 Acute OV  Complains of dry cough, chest congestion, increased SOB, wheezing, tightness in chest, head congestion w/ clear drainage, PND x4 days .  Has upcoming surgery next week - redo of knee replacement w/ ortho No hemoptysis or chest pain. No edema.  No recent abx use.  >>Omnicef x 7 d   Acute OV 08/04/12 --  Patient returns for persistent cough and congestion. The patient was seen 1 week ago for bronchitis and treated with 7 days of Omnicef. She has upcoming surgery on October 30 for a knee replacement She states that she is feeling much better but still has some  sinus drainage, congestion, and cough. She denies any discolored mucus or fever or chest pain, shortness of breath, or edema CXR last week w/ no acute process    ROV 12/25/12 -- COPD, also w hx of IBS/colitis followed by Dr [name]. She returns for regular f/u. Treated in Oct 13 for AE as above. She is starting gto have allergy sx for last 2 weeks, bad at night. She is back on her zyrtec. She believes that her breathing is a bit more limiting than a year ago, SOB w exertion and with lifting objects. She  has tried symbicort before.    PULMONARY FUNCTON TEST 01/18/2010  FVC 2.54  FEV1 1.19  FEV1/FVC 46.9  FVC  % Predicted 97  FEV % Predicted 65  FeF 25-75 .37  FeF 25-75 % Predicted 2.11      ROV 02/05/13 -- COPD, also w hx of IBS/colitis, allergic rhinitis w cough. Last time we trialed Breo qd in addition to Spiriva. She isn't sure that the Memory Dance has been beneficial. Her breathing is similar. She has had congestion and allergic sx for the last few days. She restarted zyrtec 5-6 days ago. She has chlor-trimeton but hasn't used yet.   Objective:   Physical Exam Filed Vitals:   02/05/13 1338  BP: 114/68  Pulse: 94  Temp: 97.4 F (36.3 C)   Gen: Pleasant, overwt, in no distress,  normal affect  ENT: No lesions,  mouth clear,  oropharynx clear, no postnasal drip  Neck: No JVD, no TMG, no carotid bruits  Lungs: No use of accessory muscles, no dullness to percussion, CTA   Cardiovascular: RRR, heart sounds normal, no murmur or gallops, no peripheral edema  Musculoskeletal: No deformities, no cyanosis or clubbing  Neuro: alert, non focal  Skin: Warm, no lesions or rashes   Assessment & Plan:  Allergic rhinitis, seasonal - restarted zyrtec  - will start fluticasone bid - chlortrimeton    COPD - stay on spiriva  - stop Breo - xopenex prn

## 2013-02-20 ENCOUNTER — Ambulatory Visit (INDEPENDENT_AMBULATORY_CARE_PROVIDER_SITE_OTHER): Payer: Medicare Other | Admitting: Emergency Medicine

## 2013-02-20 ENCOUNTER — Encounter: Payer: Self-pay | Admitting: Emergency Medicine

## 2013-02-20 VITALS — BP 112/62 | HR 78 | Ht 65.0 in | Wt 179.8 lb

## 2013-02-20 DIAGNOSIS — J449 Chronic obstructive pulmonary disease, unspecified: Secondary | ICD-10-CM

## 2013-02-20 DIAGNOSIS — J302 Other seasonal allergic rhinitis: Secondary | ICD-10-CM

## 2013-02-20 DIAGNOSIS — J309 Allergic rhinitis, unspecified: Secondary | ICD-10-CM

## 2013-02-20 MED ORDER — AZELASTINE HCL 0.1 % NA SOLN: 2.0000 | Freq: Two times a day (BID) | NASAL | Status: DC

## 2013-02-20 MED ORDER — LEVALBUTEROL TARTRATE 45 MCG/ACT IN AERO: 1.0000 | INHALATION_SPRAY | RESPIRATORY_TRACT | Status: DC | PRN

## 2013-02-20 NOTE — Patient Instructions (Addendum)
Please continue your zyrtec and fluticasone nasal spray Start astelin nasal spray, 2 sprays 2 -3 times a day.  Please continue Spiriva Use xopenex as needed for shortness of breath or chest tightness.  Start start omeprazole 27m twice a day for 1 week, then go to once a day.  Follow with Dr BLamonte Sakaiin 4-6 weeks

## 2013-02-20 NOTE — Progress Notes (Signed)
Subjective:    Patient ID: [name], female    DOB: Mar 31, 1933, 79 y.o.   MRN: [id number] HPI 78 yo former smoker (20 pk-yrs), hx of lymphocytic colitis/IBS on prednisone, hypothyroidism, psoriatic arthritis formerly on MTX, OA s/p knee replacement. Presents for evaluation of slowly progressive dyspnea. Bothers her with walking, climbing stairs. Also mentions frequent bouts of bronchitis with apparent associated bronchospasm. Had an acute bronchitis in 2/11, treated with pred + abx + SABA (xopenex due to tachycardia with albuterol). Then started on Spiriva. Now better but still SOB with exertion.   ROV 01/31/10 -- returns for f/u and after PFTs. Feels that she has benefitted from the Spiriva, her exertional symptoms and nighttime symptoms are much improved. Has had PFTs as below.   ROV 08/09/10 -- f/u for COPD (moderate AFL on spiro w no BD response). She was on Spiriva and felt that it helped her breathing, but it bothered her UA - made it dry, hoarse, throat burning. No allergy symptoms. She has been taking the xopenex. C/o some soreness on tongue, ? thrush.   ROV 10/17/10 -- moderate COPD, had been off pred for IBS/colitis since our last visit, just restarted by Dr Henrene Pastor. She doesn't think her breathing worsened off the pred. Tried Symbicort but thout the Spiriva helped her breathing more. She didn't have any side effects from the Symbicort. She is interested in retrying the Spiriva to see if she can tolerate. No flares or exacerbations since last visit. Rare xopenex use.  ROV 02/15/11 -- returns for COPD. We have tried both Symbicort and Spiriva. Last time we went back on Spiriva  - she believes that it has helped, but she is having some hoarse voice. Believes that it is due to Spiriva + a worsening of her allergies. Started on zyrtec in March with some relief but still bothering her. Since last visit Prednisone has been restarted. Had emergency sgy for cholecystitis in February. Having GERD sx  at night when she lays down.   ROV 06/19/11 -- follow up for COPD. She is tolerating the spiriva. Her prednisone was weaned to off in June, hasn't been restarted, she has f/u w Dr Henrene Pastor today. She rarely uses xopenex. No flares since last visit. She does still have some exertional sob.   ROV 12/18/11 -- COPD, also w hx of IBS/colitis followed by Dr Henrene Pastor. Currently on Spiriva, has xopenex to use prn - uses rarely. Still gets SOB with walking or with stairs. She thinks Spiriva helps but it also can make her throat hoarse. Not on anti-inflammatory meds at this time.   ROV 06/17/12 -- COPD, also w hx of IBS/colitis followed by Dr Henrene Pastor. She is using Spiriva. She has had more trouble with allergies and associated cough over the last several months. Not currently on zyrtec. She did benefit from chlorpheniramine. Rare SABA use.   07/28/2012 Acute OV  Complains of dry cough, chest congestion, increased SOB, wheezing, tightness in chest, head congestion w/ clear drainage, PND x4 days .  Has upcoming surgery next week - redo of knee replacement w/ ortho No hemoptysis or chest pain. No edema.  No recent abx use.  >>Omnicef x 7 d   Acute OV 08/04/12 --  Patient returns for persistent cough and congestion. The patient was seen 1 week ago for bronchitis and treated with 7 days of Omnicef. She has upcoming surgery on October 30 for a knee replacement She states that she is feeling much better but still has some  sinus drainage, congestion, and cough. She denies any discolored mucus or fever or chest pain, shortness of breath, or edema CXR last week w/ no acute process    ROV 12/25/12 -- COPD, also w hx of IBS/colitis followed by Dr Henrene Pastor. She returns for regular f/u. Treated in Oct 13 for AE as above. She is starting gto have allergy sx for last 2 weeks, bad at night. She is back on her zyrtec. She believes that her breathing is a bit more limiting than a year ago, SOB w exertion and with lifting objects. She  has tried symbicort before.    PULMONARY FUNCTON TEST 01/18/2010  FVC 2.54  FEV1 1.19  FEV1/FVC 46.9  FVC  % Predicted 97  FEV % Predicted 65  FeF 25-75 .37  FeF 25-75 % Predicted 2.11      ROV 02/05/13 -- COPD, also w hx of IBS/colitis, allergic rhinitis w cough. Last time we trialed Breo qd in addition to Spiriva. She isn't sure that the Memory Dance has been beneficial. Her breathing is similar. She has had congestion and allergic sx for the last few days. She restarted zyrtec 5-6 days ago. She has chlor-trimeton but hasn't used yet.   ROV 02/20/13 -- COPD, also w hx of IBS/colitis, allergic rhinitis w cough. Last time added fluticasone to zyrtec, asked her to try chlorpheniramine. Stopped Breo (no effect), continued Spiriva. She is having chest tightness "heaviness" that can go to her back. Some exertional dyspnea. The tightness in her chest has been of/on for a year. Her chest congestion is better, clear nasal drainage.    Objective:   Physical Exam Filed Vitals:   02/20/13 1604  BP: 112/62  Pulse: 78   Gen: Pleasant, overwt, in no distress,  normal affect  ENT: No lesions,  mouth clear,  oropharynx clear, no postnasal drip  Neck: No JVD, no TMG, no carotid bruits  Lungs: No use of accessory muscles, no dullness to percussion, CTA   Cardiovascular: RRR, heart sounds normal, no murmur or gallops, no peripheral edema  Musculoskeletal: No deformities, no cyanosis or clubbing  Neuro: alert, non focal  Skin: Warm, no lesions or rashes   Assessment & Plan:  COPD Suspect that her chest heaviness and tightness is not related to her COPD as it is intermittent, does not respond to BD's. I am suspicious that she may be feeling GERD - would like to empirically treat.  - continue spiriva  - refill xopenex prn and instructed her when to use it - consider repeat PFT or change to a different BD in the future if we aren't improving  Allergic rhinitis, seasonal - contineu zyrtec - Continue  fluticasone - start astelin

## 2013-02-20 NOTE — Assessment & Plan Note (Signed)
Suspect that her chest heaviness and tightness is not related to her COPD as it is intermittent, does not respond to BD's. I am suspicious that she may be feeling GERD - would like to empirically treat.  - continue spiriva  - refill xopenex prn and instructed her when to use it - consider repeat PFT or change to a different BD in the future if we aren't improving

## 2013-02-20 NOTE — Assessment & Plan Note (Signed)
-   contineu zyrtec - Continue fluticasone - start astelin

## 2013-03-20 ENCOUNTER — Encounter: Payer: Self-pay | Admitting: Emergency Medicine

## 2013-03-20 ENCOUNTER — Ambulatory Visit (INDEPENDENT_AMBULATORY_CARE_PROVIDER_SITE_OTHER): Payer: Medicare Other | Admitting: Emergency Medicine

## 2013-03-20 VITALS — BP 102/58 | HR 80 | Temp 97.9°F | Ht 65.0 in | Wt 177.8 lb

## 2013-03-20 DIAGNOSIS — J4489 Other specified chronic obstructive pulmonary disease: Secondary | ICD-10-CM

## 2013-03-20 DIAGNOSIS — R05 Cough: Secondary | ICD-10-CM | POA: Insufficient documentation

## 2013-03-20 DIAGNOSIS — J449 Chronic obstructive pulmonary disease, unspecified: Secondary | ICD-10-CM

## 2013-03-20 DIAGNOSIS — R059 Cough, unspecified: Secondary | ICD-10-CM

## 2013-03-20 MED ORDER — OMEPRAZOLE 20 MG PO CPDR: 20.0000 mg | DELAYED_RELEASE_CAPSULE | Freq: Every day | ORAL | Status: DC

## 2013-03-20 NOTE — Assessment & Plan Note (Signed)
She has benefited - would like to peel off some meds.  - will change astelin to prn, continue zyrtec and fluticasone  - continue omeprazole  . rov 6

## 2013-03-20 NOTE — Progress Notes (Signed)
Subjective:    Patient ID: [name], female    DOB: [birth year], 79 y.o.   MRN: [id number] HPI 79 yo former smoker (20 pk-yrs), hx of lymphocytic colitis/IBS on prednisone, hypothyroidism, psoriatic arthritis formerly on MTX, OA s/p knee replacement. Presents for evaluation of slowly progressive dyspnea. Bothers her with walking, climbing stairs. Also mentions frequent bouts of bronchitis with apparent associated bronchospasm. Had an acute bronchitis in 2/11, treated with pred + abx + SABA (xopenex due to tachycardia with albuterol). Then started on Spiriva. Now better but still SOB with exertion.   ROV 01/31/10 -- returns for f/u and after PFTs. Feels that she has benefitted from the Spiriva, her exertional symptoms and nighttime symptoms are much improved. Has had PFTs as below.   ROV 08/09/10 -- f/u for COPD (moderate AFL on spiro w no BD response). She was on Spiriva and felt that it helped her breathing, but it bothered her UA - made it dry, hoarse, throat burning. No allergy symptoms. She has been taking the xopenex. C/o some soreness on tongue, ? thrush.   ROV 10/17/10 -- moderate COPD, had been off pred for IBS/colitis since our last visit, just restarted by Dr Henrene Pastor. She doesn't think her breathing worsened off the pred. Tried Symbicort but thout the Spiriva helped her breathing more. She didn't have any side effects from the Symbicort. She is interested in retrying the Spiriva to see if she can tolerate. No flares or exacerbations since last visit. Rare xopenex use.  ROV 02/15/11 -- returns for COPD. We have tried both Symbicort and Spiriva. Last time we went back on Spiriva  - she believes that it has helped, but she is having some hoarse voice. Believes that it is due to Spiriva + a worsening of her allergies. Started on zyrtec in March with some relief but still bothering her. Since last visit Prednisone has been restarted. Had emergency sgy for cholecystitis in February. Having GERD sx  at night when she lays down.   ROV 06/19/11 -- follow up for COPD. She is tolerating the spiriva. Her prednisone was weaned to off in June, hasn't been restarted, she has f/u w Dr Henrene Pastor today. She rarely uses xopenex. No flares since last visit. She does still have some exertional sob.   ROV 12/18/11 -- COPD, also w hx of IBS/colitis followed by Dr Henrene Pastor. Currently on Spiriva, has xopenex to use prn - uses rarely. Still gets SOB with walking or with stairs. She thinks Spiriva helps but it also can make her throat hoarse. Not on anti-inflammatory meds at this time.   ROV 06/17/12 -- COPD, also w hx of IBS/colitis followed by Dr Henrene Pastor. She is using Spiriva. She has had more trouble with allergies and associated cough over the last several months. Not currently on zyrtec. She did benefit from chlorpheniramine. Rare SABA use.   07/28/2012 Acute OV  Complains of dry cough, chest congestion, increased SOB, wheezing, tightness in chest, head congestion w/ clear drainage, PND x4 days .  Has upcoming surgery next week - redo of knee replacement w/ ortho No hemoptysis or chest pain. No edema.  No recent abx use.  >>Omnicef x 7 d   Acute OV 08/04/12 --  Patient returns for persistent cough and congestion. The patient was seen 1 week ago for bronchitis and treated with 7 days of Omnicef. She has upcoming surgery on October 30 for a knee replacement She states that she is feeling much better but still has some  sinus drainage, congestion, and cough. She denies any discolored mucus or fever or chest pain, shortness of breath, or edema CXR last week w/ no acute process    ROV 12/25/12 -- COPD, also w hx of IBS/colitis followed by Dr Henrene Pastor. She returns for regular f/u. Treated in Oct 13 for AE as above. She is starting gto have allergy sx for last 2 weeks, bad at night. She is back on her zyrtec. She believes that her breathing is a bit more limiting than a year ago, SOB w exertion and with lifting objects. She  has tried symbicort before.    PULMONARY FUNCTON TEST 01/18/2010  FVC 2.54  FEV1 1.19  FEV1/FVC 46.9  FVC  % Predicted 97  FEV % Predicted 65  FeF 25-75 .37  FeF 25-75 % Predicted 2.11      ROV 02/05/13 -- COPD, also w hx of IBS/colitis, allergic rhinitis w cough. Last time we trialed Breo qd in addition to Spiriva. She isn't sure that the Memory Dance has been beneficial. Her breathing is similar. She has had congestion and allergic sx for the last few days. She restarted zyrtec 5-6 days ago. She has chlor-trimeton but hasn't used yet.   ROV 02/20/13 -- COPD, also w hx of IBS/colitis, allergic rhinitis w cough. Last time added fluticasone to zyrtec, asked her to try chlorpheniramine. Stopped Breo (no effect), continued Spiriva. She is having chest tightness "heaviness" that can go to her back. Some exertional dyspnea. The tightness in her chest has been of/on for a year. Her chest congestion is better, clear nasal drainage.   ROV 03/20/13 -- COPD, also w hx of IBS/colitis, allergic rhinitis w cough. She was having chest discomfort and cough last time - added omeprazole, also added astelin to fluticasone and zyrtec. She is much better. She took the astelin, took omeprazole.    Objective:   Physical Exam Filed Vitals:   03/20/13 1332  BP: 102/58  Pulse: 80  Temp: 97.9 F (36.6 C)   Gen: Pleasant, overwt, in no distress,  normal affect  ENT: No lesions,  mouth clear,  oropharynx clear, no postnasal drip  Neck: No JVD, no TMG, no carotid bruits  Lungs: No use of accessory muscles, no dullness to percussion, CTA   Cardiovascular: RRR, heart sounds normal, no murmur or gallops, no peripheral edema  Musculoskeletal: No deformities, no cyanosis or clubbing  Neuro: alert, non focal  Skin: Warm, no lesions or rashes   Assessment & Plan:  Cough She has benefited - would like to peel off some meds.  - will change astelin to prn, continue zyrtec and fluticasone  - continue omeprazole  . rov  6    COPD - continue Spiriva  - saba prn.

## 2013-03-20 NOTE — Assessment & Plan Note (Signed)
-   continue Spiriva  - saba prn.

## 2013-03-20 NOTE — Patient Instructions (Addendum)
Please continue your zyrtec and fluticasone nasal spray You may keep your astelin nasal spray available to use as needed Continue spiriva daily Continue your omeprazole 31m daily Follow with Dr BLamonte Sakaiin 6 months or sooner if you have any problems

## 2013-04-10 ENCOUNTER — Encounter (HOSPITAL_COMMUNITY): Payer: Self-pay | Admitting: *Deleted

## 2013-04-10 ENCOUNTER — Emergency Department (HOSPITAL_COMMUNITY)
Admission: EM | Admit: 2013-04-10 | Discharge: 2013-04-10 | Disposition: A | Discharge status: Home / Self Care | Payer: Medicare Other | Attending: Emergency Medicine | Admitting: Emergency Medicine

## 2013-04-10 ENCOUNTER — Other Ambulatory Visit: Payer: Self-pay

## 2013-04-10 ENCOUNTER — Emergency Department (HOSPITAL_COMMUNITY): Payer: Medicare Other

## 2013-04-10 DIAGNOSIS — K219 Gastro-esophageal reflux disease without esophagitis: Secondary | ICD-10-CM | POA: Insufficient documentation

## 2013-04-10 DIAGNOSIS — J449 Chronic obstructive pulmonary disease, unspecified: Secondary | ICD-10-CM | POA: Insufficient documentation

## 2013-04-10 DIAGNOSIS — IMO0002 Reserved for concepts with insufficient information to code with codable children: Secondary | ICD-10-CM | POA: Insufficient documentation

## 2013-04-10 DIAGNOSIS — Z79899 Other long term (current) drug therapy: Secondary | ICD-10-CM | POA: Insufficient documentation

## 2013-04-10 DIAGNOSIS — E039 Hypothyroidism, unspecified: Secondary | ICD-10-CM | POA: Insufficient documentation

## 2013-04-10 DIAGNOSIS — Z872 Personal history of diseases of the skin and subcutaneous tissue: Secondary | ICD-10-CM | POA: Insufficient documentation

## 2013-04-10 DIAGNOSIS — I4891 Unspecified atrial fibrillation: Secondary | ICD-10-CM

## 2013-04-10 DIAGNOSIS — J4489 Other specified chronic obstructive pulmonary disease: Secondary | ICD-10-CM | POA: Insufficient documentation

## 2013-04-10 DIAGNOSIS — Z8739 Personal history of other diseases of the musculoskeletal system and connective tissue: Secondary | ICD-10-CM | POA: Insufficient documentation

## 2013-04-10 DIAGNOSIS — Z87891 Personal history of nicotine dependence: Secondary | ICD-10-CM | POA: Insufficient documentation

## 2013-04-10 DIAGNOSIS — Z8679 Personal history of other diseases of the circulatory system: Secondary | ICD-10-CM | POA: Insufficient documentation

## 2013-04-10 DIAGNOSIS — Z8719 Personal history of other diseases of the digestive system: Secondary | ICD-10-CM | POA: Insufficient documentation

## 2013-04-10 LAB — COMPREHENSIVE METABOLIC PANEL
ALT: 10 U/L (ref 0–35)
AST: 13 U/L (ref 0–37)
Alkaline Phosphatase: 70 U/L (ref 39–117)
CO2: 28 mEq/L (ref 19–32)
Chloride: 102 mEq/L (ref 96–112)
GFR calc Af Amer: 73 mL/min — ABNORMAL LOW (ref >90)
GFR calc non Af Amer: 63 mL/min — ABNORMAL LOW (ref >90)
Glucose, Bld: 117 mg/dL — ABNORMAL HIGH (ref 70–99)
Potassium: 4.1 mEq/L (ref 3.5–5.1)
Sodium: 137 mEq/L (ref 135–145)
Total Bilirubin: 0.4 mg/dL (ref 0.3–1.2)

## 2013-04-10 LAB — CBC WITH DIFFERENTIAL/PLATELET
Basophils Absolute: 0 10*3/uL (ref 0.0–0.1)
Eosinophils Relative: 2 % (ref 0–5)
Lymphocytes Relative: 24 % (ref 12–46)
Lymphs Abs: 2.2 10*3/uL (ref 0.7–4.0)
MCV: 89 fL (ref 78.0–100.0)
Neutro Abs: 5.9 10*3/uL (ref 1.7–7.7)
Neutrophils Relative %: 63 % (ref 43–77)
Platelets: 236 10*3/uL (ref 150–400)
RBC: 5.09 MIL/uL (ref 3.87–5.11)
WBC: 9.3 10*3/uL (ref 4.0–10.5)

## 2013-04-10 MED ORDER — DILTIAZEM HCL 25 MG/5ML IV SOLN: 10.0000 mg | Freq: Once | INTRAVENOUS | Status: DC

## 2013-04-10 MED ORDER — DILTIAZEM HCL 100 MG IV SOLR
5.0000 mg/h | INTRAVENOUS | Status: DC
  Filled 2013-04-10: qty 100

## 2013-04-10 NOTE — ED Notes (Signed)
Pt states was eating & felt like her heart was racing. Family states rate 156

## 2013-04-10 NOTE — ED Provider Notes (Addendum)
History    CSN: [id number] Arrival date & time 04/10/13  2036  First MD Initiated Contact with Patient 04/10/13 2052     Chief Complaint  Patient presents with  . Palpitations   (Consider location/radiation/quality/duration/timing/severity/associated sxs/prior Treatment) Patient is a 79 y.o. female presenting with palpitations. The history is provided by the patient (the pt stated she noticed palpitaions around 6 pm tonight.  no chest pain.  mild sob).  Palpitations Palpitations quality:  Irregular Onset quality:  Sudden Timing:  Constant Progression:  Unchanged Chronicity:  New Context: not anxiety   Relieved by:  Nothing Associated symptoms: no back pain, no chest pain and no cough    Past Medical History  Diagnosis Date  . Hypothyroidism   . Psoriatic arthritis   . IBS (irritable bowel syndrome)   . Hemorrhoids   . Diverticulosis   . Rectal polyp   . GERD (gastroesophageal reflux disease)   . Family hx of colon cancer   . Uterine polyp   . Arthritis   . COPD (chronic obstructive pulmonary disease)   . Lymphocytic colitis    Past Surgical History  Procedure Laterality Date  . Knee surgery      lt.  Marland Kitchen Uterine polypectomy    . Cholecystectomy  Dec 04, 2010  . Total knee revision  08/06/2012    Procedure: TOTAL KNEE REVISION;  Surgeon: Loanne Drilling, MD;  Location: [facility];  Service: Orthopedics;  Laterality: Left;  Left Total Knee Arthroplasty Revision   Family History  Problem Relation Age of Onset  . Heart disease Father     pacemaker  . Colon cancer Mother   . Lung cancer Father    History  Substance Use Topics  . Smoking status: Former Smoker -- 1.00 packs/day for 20 years    Types: Cigarettes    Quit date: 10/08/1980  . Smokeless tobacco: Never Used  . Alcohol Use: Yes     Comment: rarely   OB History   Grav Para Term Preterm Abortions TAB SAB Ect Mult Living                 Review of Systems  Constitutional: Negative for appetite change and  fatigue.  HENT: Negative for congestion, sinus pressure and ear discharge.   Eyes: Negative for discharge.  Respiratory: Negative for cough.   Cardiovascular: Positive for palpitations. Negative for chest pain.  Gastrointestinal: Negative for abdominal pain and diarrhea.  Genitourinary: Negative for frequency and hematuria.  Musculoskeletal: Negative for back pain.  Skin: Negative for rash.  Neurological: Negative for seizures and headaches.  Psychiatric/Behavioral: Negative for hallucinations.    Allergies  Codeine  Home Medications   Current Outpatient Rx  Name  Route  Sig  Dispense  Refill  . acetaminophen (TYLENOL) 325 MG tablet   Oral   Take 2 tablets (650 mg total) by mouth every 6 (six) hours as needed (or Fever >/= 101).   60 tablet   0   . ALPRAZolam (XANAX) 0.5 MG tablet   Oral   Take 0.25-0.5 mg by mouth at bedtime as needed. Sleep         . azelastine (ASTELIN) 137 MCG/SPRAY nasal spray   Nasal   Place 2 sprays into the nose 2 (two) times daily. Use in each nostril as directed   30 mL   12   . betamethasone dipropionate (DIPROLENE) 0.05 % ointment   Topical   Apply 1 application topically as needed.          Marland Kitchen  cetirizine (ZYRTEC) 10 MG tablet   Oral   Take 10 mg by mouth daily.         . chlorpheniramine (CHLOR-TRIMETON) 4 MG tablet   Oral   Take 4 mg by mouth 2 (two) times daily as needed for allergies.         . diphenoxylate-atropine (LOMOTIL) 2.5-0.025 MG per tablet   Oral   Take 1-2 tablets by mouth 3 (three) times daily as needed. diarrhea         . fluticasone (FLONASE) 50 MCG/ACT nasal spray   Nasal   Place 2 sprays into the nose 2 (two) times daily.   16 g   12   . levalbuterol (XOPENEX HFA) 45 MCG/ACT inhaler   Inhalation   Inhale 1-2 puffs into the lungs every 4 (four) hours as needed for wheezing or shortness of breath. Wheezing   1 Inhaler   11   . levothyroxine (SYNTHROID, LEVOTHROID) 112 MCG tablet   Oral   Take  125 mcg by mouth daily before breakfast.          . methylcellulose (ARTIFICIAL TEARS) 1 % ophthalmic solution   Both Eyes   Place 1 drop into both eyes 2 (two) times daily as needed. Dry eyes         . omeprazole (PRILOSEC) 20 MG capsule   Oral   Take 1 capsule (20 mg total) by mouth daily.   30 capsule   11   . tiotropium (SPIRIVA) 18 MCG inhalation capsule   Inhalation   Place 1 capsule (18 mcg total) into inhaler and inhale daily.   90 capsule   3    BP 148/112  Pulse 147  Temp(Src) 97.8 F (36.6 C) (Oral)  Resp 20  Ht 5\' 5"  (1.651 m)  Wt 169 lb (76.658 kg)  BMI 28.12 kg/m2  SpO2 96% Physical Exam  Constitutional: She is oriented to person, place, and time. She appears well-developed.  HENT:  Head: Normocephalic.  Eyes: Conjunctivae and EOM are normal. No scleral icterus.  Neck: Neck supple. No thyromegaly present.  Cardiovascular: Exam reveals no gallop and no friction rub.   No murmur heard. Irregular rapid rate  Pulmonary/Chest: No stridor. She has no wheezes. She has no rales. She exhibits no tenderness.  Abdominal: She exhibits no distension. There is no tenderness. There is no rebound.  Musculoskeletal: Normal range of motion. She exhibits no edema.  Lymphadenopathy:    She has no cervical adenopathy.  Neurological: She is oriented to person, place, and time. Coordination normal.  Skin: No rash noted. No erythema.  Psychiatric: She has a normal mood and affect. Her behavior is normal.    ED Course  Procedures (including critical care time) Labs Reviewed  CBC WITH DIFFERENTIAL  COMPREHENSIVE METABOLIC PANEL  TROPONIN I   No results found. No diagnosis found. edekg  Date: 04/10/2013  Rate: 147  Rhyth*m: atrial fibrillation  QRS Axis: normal  Intervals: normal  ST/T Wave abnormalities: normal  Conduction Disutrbances:none  Narrative Interpretation:   Old EKG Reviewed: changes noted   Date: 04/10/2013  Rate: 107  Rhythm: sinus  tachycardia  QRS Axis: normal  Intervals: normal  ST/T Wave abnormalities: normal  Conduction Disutrbances:none  Narrative Interpretation:   Old EKG Reviewed: changes noted  \ MDM   Pt was in rapid afib initially in the ed.  She converted to sinus tach at 104 before she was given any cardiazem.  Pt to follow up with her md dr.  fagan Benny Lennert, MD 04/10/13 2103  Benny Lennert, MD 04/10/13 2138  Benny Lennert, MD 04/10/13 [phone]

## 2013-04-10 NOTE — ED Notes (Signed)
Pt alert & oriented x4, stable gait. Patient given discharge instructions, paperwork & prescription(s). Patient  instructed to stop at the registration desk to finish any additional paperwork. Patient verbalized understanding. Pt left department w/ no further questions. 

## 2013-04-10 NOTE — ED Notes (Signed)
MD order to hold on Cardizem for present since rate changed and patient is in NSR / Sinus Tach rate 102

## 2013-04-21 ENCOUNTER — Other Ambulatory Visit (HOSPITAL_COMMUNITY): Payer: Medicare Other

## 2013-04-21 ENCOUNTER — Ambulatory Visit: Payer: Medicare Other | Admitting: *Deleted

## 2013-04-21 DIAGNOSIS — I4891 Unspecified atrial fibrillation: Secondary | ICD-10-CM

## 2013-04-21 NOTE — Patient Instructions (Addendum)
Your physician has recommended that you wear an event monitor. Event monitors are medical devices that record the heart's electrical activity. Doctors most often [place] these monitors to diagnose arrhythmias. Arrhythmias are problems with the speed or rhythm of the heartbeat. The monitor is a small, portable device. You can wear one while you do your normal daily activities. This is usually used to diagnose what is causing palpitations/syncope (passing out).FOR 14 DAYS DR HAWKINS WILL CONTACT YOU WITH THE RESULTS ONCE COMPLETED

## 2013-04-24 ENCOUNTER — Telehealth: Payer: Self-pay | Admitting: *Deleted

## 2013-04-24 ENCOUNTER — Ambulatory Visit (HOSPITAL_COMMUNITY)
Admission: RE | Admit: 2013-04-24 | Discharge: 2013-04-24 | Disposition: A | Discharge status: Home / Self Care | Payer: Medicare Other | Source: Ambulatory Visit | Attending: Pulmonary Disease | Admitting: Pulmonary Disease

## 2013-04-24 DIAGNOSIS — R079 Chest pain, unspecified: Secondary | ICD-10-CM | POA: Insufficient documentation

## 2013-04-24 DIAGNOSIS — J4489 Other specified chronic obstructive pulmonary disease: Secondary | ICD-10-CM | POA: Insufficient documentation

## 2013-04-24 DIAGNOSIS — J449 Chronic obstructive pulmonary disease, unspecified: Secondary | ICD-10-CM | POA: Insufficient documentation

## 2013-04-24 DIAGNOSIS — I4891 Unspecified atrial fibrillation: Secondary | ICD-10-CM | POA: Insufficient documentation

## 2013-04-24 NOTE — Progress Notes (Signed)
*  PRELIMINARY RESULTS* Echocardiogram 2D Echocardiogram has been performed.  [name] 04/24/2013, 1:18 PM

## 2013-04-24 NOTE — Telephone Encounter (Signed)
PT  Is calling because event monitor woke her up at 1:00am going dead. She states that it will not charge has been hooked up since then and still will not turn on. She has read the instructions and it is not doing anything they say it should when it is hooked and charging.

## 2013-04-24 NOTE — Telephone Encounter (Signed)
Called pt asked if she could come to our office around 3:15. Pt agreed.

## 2013-04-25 ENCOUNTER — Encounter (HOSPITAL_COMMUNITY): Payer: Self-pay

## 2013-04-25 ENCOUNTER — Emergency Department (HOSPITAL_COMMUNITY)
Admission: EM | Admit: 2013-04-25 | Discharge: 2013-04-25 | Disposition: A | Discharge status: Home / Self Care | Payer: Medicare Other | Attending: Emergency Medicine | Admitting: Emergency Medicine

## 2013-04-25 DIAGNOSIS — R002 Palpitations: Secondary | ICD-10-CM | POA: Insufficient documentation

## 2013-04-25 DIAGNOSIS — I4892 Unspecified atrial flutter: Secondary | ICD-10-CM | POA: Insufficient documentation

## 2013-04-25 DIAGNOSIS — Z872 Personal history of diseases of the skin and subcutaneous tissue: Secondary | ICD-10-CM | POA: Insufficient documentation

## 2013-04-25 DIAGNOSIS — R197 Diarrhea, unspecified: Secondary | ICD-10-CM | POA: Insufficient documentation

## 2013-04-25 DIAGNOSIS — Z79899 Other long term (current) drug therapy: Secondary | ICD-10-CM | POA: Insufficient documentation

## 2013-04-25 DIAGNOSIS — Z87891 Personal history of nicotine dependence: Secondary | ICD-10-CM | POA: Insufficient documentation

## 2013-04-25 DIAGNOSIS — J441 Chronic obstructive pulmonary disease with (acute) exacerbation: Secondary | ICD-10-CM | POA: Insufficient documentation

## 2013-04-25 DIAGNOSIS — R0789 Other chest pain: Secondary | ICD-10-CM | POA: Insufficient documentation

## 2013-04-25 DIAGNOSIS — K219 Gastro-esophageal reflux disease without esophagitis: Secondary | ICD-10-CM | POA: Insufficient documentation

## 2013-04-25 DIAGNOSIS — Z8679 Personal history of other diseases of the circulatory system: Secondary | ICD-10-CM | POA: Insufficient documentation

## 2013-04-25 DIAGNOSIS — E039 Hypothyroidism, unspecified: Secondary | ICD-10-CM | POA: Insufficient documentation

## 2013-04-25 DIAGNOSIS — Z8719 Personal history of other diseases of the digestive system: Secondary | ICD-10-CM | POA: Insufficient documentation

## 2013-04-25 DIAGNOSIS — Z8742 Personal history of other diseases of the female genital tract: Secondary | ICD-10-CM | POA: Insufficient documentation

## 2013-04-25 LAB — CBC WITH DIFFERENTIAL/PLATELET
Basophils Absolute: 0.1 10*3/uL (ref 0.0–0.1)
Basophils Relative: 1 % (ref 0–1)
Eosinophils Absolute: 0.2 10*3/uL (ref 0.0–0.7)
Hemoglobin: 15.5 g/dL — ABNORMAL HIGH (ref 12.0–15.0)
MCH: 29.9 pg (ref 26.0–34.0)
MCHC: 33.3 g/dL (ref 30.0–36.0)
Monocytes Absolute: 0.7 10*3/uL (ref 0.1–1.0)
Monocytes Relative: 10 % (ref 3–12)
Neutro Abs: 4.2 10*3/uL (ref 1.7–7.7)
Neutrophils Relative %: 62 % (ref 43–77)
RDW: 13.3 % (ref 11.5–15.5)

## 2013-04-25 LAB — BASIC METABOLIC PANEL
BUN: 13 mg/dL (ref 6–23)
Creatinine, Ser: 0.79 mg/dL (ref 0.50–1.10)
GFR calc Af Amer: >90 mL/min (ref >90)
GFR calc non Af Amer: 78 mL/min — ABNORMAL LOW (ref >90)
Glucose, Bld: 117 mg/dL — ABNORMAL HIGH (ref 70–99)

## 2013-04-25 MED ORDER — METOPROLOL TARTRATE 25 MG PO TABS: 25.0000 mg | ORAL_TABLET | ORAL | Status: DC | PRN

## 2013-04-25 MED ORDER — DILTIAZEM HCL 100 MG IV SOLR
5.0000 mg/h | INTRAVENOUS | Status: DC
  Administered 2013-04-25: 5 mg/h via INTRAVENOUS
  Filled 2013-04-25: qty 100

## 2013-04-25 MED ORDER — ASPIRIN 325 MG PO TABS
325.0000 mg | ORAL_TABLET | Freq: Once | ORAL | Status: AC
  Administered 2013-04-25: 325 mg via ORAL
  Filled 2013-04-25: qty 1

## 2013-04-25 MED ORDER — ASPIRIN EC 325 MG PO TBEC: 325.0000 mg | DELAYED_RELEASE_TABLET | Freq: Every day | ORAL | Status: DC

## 2013-04-25 NOTE — ED Provider Notes (Signed)
History    CSN: [id number] Arrival date & time 04/25/13  0340  First MD Initiated Contact with Patient 04/25/13 [phone]     Chief Complaint  Patient presents with  . Tachycardia    Patient is a 79 y.o. female presenting with palpitations. The history is provided by the patient.  Palpitations Palpitations quality:  Irregular Onset quality:  Sudden Timing:  Constant Progression:  Worsening Chronicity:  Recurrent Relieved by:  Nothing Worsened by:  Nothing tried Associated symptoms: chest pressure and shortness of breath   Associated symptoms: no cough, no syncope, no vomiting and no weakness   pt reports she woke up with palpitations. She had been well prior to going to bed She reports mild SOB due to palpitations No syncope She had this recently but it resolved.  She has been wearing outpatient monitor and just had echo performed Past Medical History  Diagnosis Date  . Hypothyroidism   . Psoriatic arthritis   . IBS (irritable bowel syndrome)   . Hemorrhoids   . Diverticulosis   . Rectal polyp   . GERD (gastroesophageal reflux disease)   . Family hx of colon cancer   . Uterine polyp   . Arthritis   . COPD (chronic obstructive pulmonary disease)   . Lymphocytic colitis    Past Surgical History  Procedure Laterality Date  . Knee surgery      lt.  Marland Kitchen Uterine polypectomy    . Cholecystectomy  Dec 04, 2010  . Total knee revision  08/06/2012    Procedure: TOTAL KNEE REVISION;  Surgeon: Loanne Drilling, MD;  Location: [facility];  Service: Orthopedics;  Laterality: Left;  Left Total Knee Arthroplasty Revision   Family History  Problem Relation Age of Onset  . Heart disease Father     pacemaker  . Colon cancer Mother   . Lung cancer Father    History  Substance Use Topics  . Smoking status: Former Smoker -- 1.00 packs/day for 20 years    Types: Cigarettes    Quit date: 10/08/1980  . Smokeless tobacco: Never Used  . Alcohol Use: Yes     Comment: rarely   OB History   Grav Para Term Preterm Abortions TAB SAB Ect Mult Living                 Review of Systems  Constitutional: Negative for fever.  Respiratory: Positive for shortness of breath. Negative for cough.   Cardiovascular: Positive for palpitations. Negative for syncope.  Gastrointestinal: Positive for diarrhea. Negative for vomiting.       Chronic diarrhea   Neurological: Negative for weakness.  All other systems reviewed and are negative.    Allergies  Codeine  Home Medications   Current Outpatient Rx  Name  Route  Sig  Dispense  Refill  . acetaminophen (TYLENOL) 325 MG tablet   Oral   Take 2 tablets (650 mg total) by mouth every 6 (six) hours as needed (or Fever >/= 101).   60 tablet   0   . ALPRAZolam (XANAX) 0.5 MG tablet   Oral   Take 0.25-0.5 mg by mouth at bedtime as needed. Sleep         . betamethasone dipropionate (DIPROLENE) 0.05 % ointment   Topical   Apply 1 application topically daily as needed.          . cetirizine (ZYRTEC) 10 MG tablet   Oral   Take 10 mg by mouth daily.         Marland Kitchen  diphenoxylate-atropine (LOMOTIL) 2.5-0.025 MG per tablet   Oral   Take 1-2 tablets by mouth 3 (three) times daily as needed. diarrhea         . levalbuterol (XOPENEX HFA) 45 MCG/ACT inhaler   Inhalation   Inhale 1-2 puffs into the lungs every 4 (four) hours as needed for wheezing or shortness of breath. Wheezing   1 Inhaler   11   . levothyroxine (SYNTHROID, LEVOTHROID) 125 MCG tablet   Oral   Take 125 mcg by mouth every morning.         . methylcellulose (ARTIFICIAL TEARS) 1 % ophthalmic solution   Both Eyes   Place 1 drop into both eyes 2 (two) times daily as needed. Dry eyes         . omeprazole (PRILOSEC) 20 MG capsule   Oral   Take 1 capsule (20 mg total) by mouth daily.   30 capsule   11   . tiotropium (SPIRIVA) 18 MCG inhalation capsule   Inhalation   Place 1 capsule (18 mcg total) into inhaler and inhale daily.   90 capsule   3   Physical  Exam BP 135/53  Pulse 153  Temp(Src) 98 F (36.7 C) (Oral)  Resp 18  Ht 5\' 5"  (1.651 m)  Wt 170 lb (77.111 kg)  BMI 28.29 kg/m2  SpO2 97%  CONSTITUTIONAL: Well developed/well nourished HEAD: Normocephalic/atraumatic EYES: EOMI/PERRL ENMT: Mucous membranes moist NECK: supple no meningeal signs SPINE:entire spine nontender CV: tachycardic, irregular.  No loud murmurs noted LUNGS: Lungs are clear to auscultation bilaterally, no apparent distress ABDOMEN: soft, nontender, no rebound or guarding GU:no cva tenderness NEURO: Pt is awake/alert, moves all extremitiesx4 EXTREMITIES: pulses normal, full ROM SKIN: warm, color normal PSYCH: no abnormalities of mood noted  ED Course  Procedures (including critical care time) Labs Reviewed  BASIC METABOLIC PANEL  CBC WITH DIFFERENTIAL  TROPONIN I   4:07 AM Pt appears to be in aflutter She had this on previous ED visit, but it resolved spontaneously Will start cardizem drip 4:38 AM It appears her a-flutter has resolved I have asked nursing to stop the cardizem 5:28 AM I spoke to on call cardiology dr whitlock He recommends lopressor 25mg  PO PRN for palpitations Also recommends ASA 325mg  PO daily and see cardiology soon in followup  Pt requesting d/c home HR has improved and is now in sinus rhythm.  No distress noted We discussed strict return precautions and need for cardiology followup MDM  Nursing notes including past medical history and social history reviewed and considered in documentation Labs/vital reviewed and considered Previous records reviewed and considered - ED visit from 07/04 reviewed     Date: 04/25/2013  Rate: 147  Rhythm: atrial flutter  QRS Axis: normal  Intervals: normal  ST/T Wave abnormalities: nonspecific ST changes  Conduction Disutrbances:none  Narrative Interpretation:   Old EKG Reviewed: changes noted    Date: 04/25/2013 0427am  Rate: 98  Rhythm: normal sinus rhythm  QRS Axis: normal   Intervals: normal  ST/T Wave abnormalities: nonspecific ST changes  Conduction Disutrbances:none  Narrative Interpretation:   Old EKG Reviewed: changes noted No longer in aflutter      Joya Gaskins, MD 04/25/13 [phone]

## 2013-04-25 NOTE — ED Notes (Signed)
Pt heart rate has slowed and appears to have converted to NSR. Dr Netta Cedars aware of same, and diltiazem drip dc'd at this time

## 2013-04-25 NOTE — ED Notes (Signed)
Pt seen here earlier in the week for same, states she started feeling like heart was racing again this am. Pt denies pain or other complaints

## 2013-05-05 ENCOUNTER — Other Ambulatory Visit: Payer: Self-pay

## 2013-05-06 ENCOUNTER — Encounter: Payer: Medicare Other | Admitting: Cardiology

## 2013-05-07 ENCOUNTER — Ambulatory Visit (INDEPENDENT_AMBULATORY_CARE_PROVIDER_SITE_OTHER): Payer: Medicare Other | Admitting: Cardiology

## 2013-05-07 ENCOUNTER — Ambulatory Visit (INDEPENDENT_AMBULATORY_CARE_PROVIDER_SITE_OTHER)
Admission: RE | Admit: 2013-05-07 | Discharge: 2013-05-07 | Disposition: A | Discharge status: Home / Self Care | Payer: Medicare Other | Source: Ambulatory Visit | Attending: Cardiology | Admitting: Cardiology

## 2013-05-07 ENCOUNTER — Encounter: Payer: Self-pay | Admitting: Cardiology

## 2013-05-07 VITALS — BP 110/80 | HR 77 | Ht 65.0 in | Wt 173.0 lb

## 2013-05-07 DIAGNOSIS — R42 Dizziness and giddiness: Secondary | ICD-10-CM

## 2013-05-07 DIAGNOSIS — I4891 Unspecified atrial fibrillation: Secondary | ICD-10-CM

## 2013-05-07 DIAGNOSIS — R079 Chest pain, unspecified: Secondary | ICD-10-CM

## 2013-05-07 MED ORDER — DRONEDARONE HCL 400 MG PO TABS: 400.0000 mg | ORAL_TABLET | Freq: Two times a day (BID) | ORAL | Status: DC

## 2013-05-07 MED ORDER — DILTIAZEM HCL ER COATED BEADS 120 MG PO CP24: 120.0000 mg | ORAL_CAPSULE | Freq: Every day | ORAL | Status: DC

## 2013-05-07 NOTE — Patient Instructions (Addendum)
Start Multaq (dronedarone) 400mg  two times a day. TAKE THIS WITH FOOD.  Start diltiazem CD 120mg  daily.  You have been referred to CT for a non contrast CT of your head. TODAY.  Your physician recommends that you schedule a follow-up appointment in: 1 month with Dr Shirlee Latch. This is scheduled for Monday August 25,2014 at 3:45PM.

## 2013-05-07 NOTE — Progress Notes (Signed)
Patient ID: [name], female   DOB: Apr 07, 1933, 79 y.o.   MRN: [id number] PCP: Dr. Willey Blade  79 yo with history of psoriatic arthritis, lymphocytic colitis, and COPD returns for cardiology followup.  I evaluated her initially for sinus tachycardia.  She had a normal Lexiscan Myoview in 7/13 for atypcial chest pain.     Since I last saw her, she went to the ER twice earlier this month.  Both times, she was in atrial fibrillation with rapid response.  Both times, she converted back to NSR in the ER.  She was started on Xarelto by her PCP.  Echo this month showed normal EF with mild LVH and normal RV.  Since her last ER visit, she has had no more tachypalpitations.  She is chronically short of breath after walking 100-200 feet from COPD.  Her main complaint today is poor balance.  She feels like her equilibrium is off.  She is not lightheaded.    ECG: NSR, normal  Labs (12/13): TnI negative x 2, TSH normal, LDL 59 Labs (7/14): K 3.5, creatinine 0.79  PMH: 1. Lymphocytic colitis 2. Hypothyroidism 3. Psoriatic arthritis with h/o MTX use.  4. OA s/p TKR 5. COPD with h/o exacerbations.  Prior smoker.  6. GERD with hiatal hernia 7. CCY 8. Depression 9. Chest pain: Lexiscan myoview (12/13) with EF 75%, apical thinning but no ischemia or infarction.  10. Paroxysmal atrial fibrillation  SH: Former smoker, quit in 1982, lives alone.   FH: Father with PCM, brother with MI.   ROS: All systems reviewed and negative except as per HPI.   Current Outpatient Prescriptions  Medication Sig Dispense Refill  . acetaminophen (TYLENOL) 325 MG tablet Take 2 tablets (650 mg total) by mouth every 6 (six) hours as needed (or Fever >/= 101).  60 tablet  0  . ALPRAZolam (XANAX) 0.5 MG tablet Take 0.25-0.5 mg by mouth at bedtime as needed. Sleep      . aspirin EC 325 MG tablet Take 1 tablet (325 mg total) by mouth daily.  21 tablet  0  . betamethasone dipropionate (DIPROLENE) 0.05 % ointment Apply 1  application topically daily as needed.       . cetirizine (ZYRTEC) 10 MG tablet Take 10 mg by mouth daily.      . diphenoxylate-atropine (LOMOTIL) 2.5-0.025 MG per tablet Take 1-2 tablets by mouth 3 (three) times daily as needed. diarrhea      . levalbuterol (XOPENEX HFA) 45 MCG/ACT inhaler Inhale 1-2 puffs into the lungs every 4 (four) hours as needed for wheezing or shortness of breath. Wheezing  1 Inhaler  11  . levothyroxine (SYNTHROID, LEVOTHROID) 125 MCG tablet Take 125 mcg by mouth every morning.      . methylcellulose (ARTIFICIAL TEARS) 1 % ophthalmic solution Place 1 drop into both eyes 2 (two) times daily as needed. Dry eyes      . metoprolol (LOPRESSOR) 25 MG tablet Take 1 tablet (25 mg total) by mouth as needed (use one tablet PO if you have any palpitations.  If you do not feel improved within 30 minutes return to the Emergency Department).  14 tablet  0  . omeprazole (PRILOSEC) 20 MG capsule Take 1 capsule (20 mg total) by mouth daily.  30 capsule  11  . tiotropium (SPIRIVA) 18 MCG inhalation capsule Place 1 capsule (18 mcg total) into inhaler and inhale daily.  90 capsule  3  . XARELTO 20 MG TABS Take 1 tablet by mouth daily.      Marland Kitchen  diltiazem (CARDIZEM CD) 120 MG 24 hr capsule Take 1 capsule (120 mg total) by mouth daily.  30 capsule  3  . dronedarone (MULTAQ) 400 MG tablet Take 1 tablet (400 mg total) by mouth 2 (two) times daily with a meal.  60 tablet  3  . [DISCONTINUED] Calcium Carbonate (CALCIUM 600) 1500 MG TABS Take 2 tablets by mouth daily.         No current facility-administered medications for this visit.    BP 110/80  Pulse 77  Ht 5' 5"  (1.651 m)  Wt 78.472 kg (173 lb)  BMI 28.79 kg/m2  SpO2 98% General: NAD Neck: No JVD, no thyromegaly or thyroid nodule.  Lungs: Prolonged respiratory phase CV: Nondisplaced PMI.  Heart regular S1/S2, no S3/S4, no murmur.  No peripheral edema.  No carotid bruit.  Normal pedal pulses.  Abdomen: Soft, nontender, no  hepatosplenomegaly, no distention.   Neurologic: Alert and oriented x 3.  Psych: Normal affect. Extremities: No clubbing or cyanosis.   Assessment/Plan: 1. Chest pain: Atypical with one prolonged episode at rest in 12/13.  No further chest pain.  Risk factors include family history (brother with MI) and prior smoking.  Lexiscan myoview was normal in 12/13.  no further workup needed at this time.  2. Atrial fibrillation: Paroxysmal atrial fibrillation.  She was in the ER twice this month with atrial fibrillation that converted back to NSR.  She is in NSR today.  She is very symptomatic when she goes into atrial fibrillation.   - Start diltiazem CD 120 mg daily.  - Dronedarone 400 mg bid to maintain NSR (take with food).  This hopefully will keep her out of the ER. - Continue Xarelto, stop ASA.   3. Disequilibrium: Possible that this is vertigo due to labyrinthitis, etc.  However, given recent diagnosis of atrial fibrillation, I think that we need to rule out CVA.  I will get a head CT without contrast.  Followup in 1 month with ECG  [name] 05/07/2013

## 2013-05-08 ENCOUNTER — Other Ambulatory Visit (HOSPITAL_COMMUNITY): Payer: Self-pay | Admitting: Internal Medicine

## 2013-05-08 ENCOUNTER — Other Ambulatory Visit: Payer: Self-pay | Admitting: *Deleted

## 2013-05-08 ENCOUNTER — Ambulatory Visit (HOSPITAL_COMMUNITY)
Admission: RE | Admit: 2013-05-08 | Discharge: 2013-05-08 | Disposition: A | Discharge status: Home / Self Care | Payer: Medicare Other | Source: Ambulatory Visit | Attending: Internal Medicine | Admitting: Internal Medicine

## 2013-05-08 DIAGNOSIS — M542 Cervicalgia: Secondary | ICD-10-CM

## 2013-05-08 DIAGNOSIS — I4891 Unspecified atrial fibrillation: Secondary | ICD-10-CM

## 2013-05-08 DIAGNOSIS — M47812 Spondylosis without myelopathy or radiculopathy, cervical region: Secondary | ICD-10-CM | POA: Insufficient documentation

## 2013-05-25 ENCOUNTER — Encounter: Payer: Medicare Other | Admitting: Cardiology

## 2013-05-26 ENCOUNTER — Other Ambulatory Visit: Payer: Self-pay | Admitting: *Deleted

## 2013-05-26 MED ORDER — AZELASTINE HCL 0.15 % NA SOLN: 2.0000 | Freq: Two times a day (BID) | NASAL | Status: DC

## 2013-06-01 ENCOUNTER — Encounter: Payer: Self-pay | Admitting: Cardiology

## 2013-06-01 ENCOUNTER — Ambulatory Visit (INDEPENDENT_AMBULATORY_CARE_PROVIDER_SITE_OTHER): Payer: Medicare Other | Admitting: Cardiology

## 2013-06-01 VITALS — BP 124/50 | HR 76 | Ht 65.0 in | Wt 176.4 lb

## 2013-06-01 DIAGNOSIS — I4891 Unspecified atrial fibrillation: Secondary | ICD-10-CM

## 2013-06-01 DIAGNOSIS — R42 Dizziness and giddiness: Secondary | ICD-10-CM

## 2013-06-01 MED ORDER — DILTIAZEM HCL ER COATED BEADS 120 MG PO CP24: 120.0000 mg | ORAL_CAPSULE | Freq: Every day | ORAL | Status: DC

## 2013-06-01 MED ORDER — DRONEDARONE HCL 400 MG PO TABS: 400.0000 mg | ORAL_TABLET | Freq: Two times a day (BID) | ORAL | Status: DC

## 2013-06-01 NOTE — Patient Instructions (Addendum)
Schedule an appointment in [facility] in the next week or so since you are on Xarelto now.   Your physician recommends that you schedule a follow-up appointment in: 6 months with Dr Shirlee Latch.   Your physician recommends that you return for lab work in: 6 months when you see Dr McLean--CBCd/BMET.

## 2013-06-02 NOTE — Progress Notes (Signed)
Patient ID: [name], female   DOB: 11/27/1932, 79 y.o.   MRN: [id number] PCP: Dr. Willey Blade  79 yo with history of psoriatic arthritis, lymphocytic colitis, and COPD returns for cardiology followup.  I evaluated her initially for sinus tachycardia.  She had a normal Lexiscan Myoview in 7/13 for atypical chest pain.     Since last appointment, she has been doing well.  No tachypalpitations.  No chest pain.  She is chronically short of breath after walking 100-200 feet from COPD.  She still has some vertigo-type symptoms at times but denies lightheadedness or syncope.  CT head after last visit showed no evidence for CVA. She is in NSR today.    Labs (12/13): TnI negative x 2, TSH normal, LDL 59 Labs (7/14): K 3.5, creatinine 0.79  PMH: 1. Lymphocytic colitis 2. Hypothyroidism 3. Psoriatic arthritis with h/o MTX use.  4. OA s/p TKR 5. COPD with h/o exacerbations.  Prior smoker.  6. GERD with hiatal hernia 7. CCY 8. Depression 9. Chest pain: Lexiscan myoview (12/13) with EF 75%, apical thinning but no ischemia or infarction.  10. Paroxysmal atrial fibrillation  SH: Former smoker, quit in 1982, lives alone.   FH: Father with PCM, brother with MI.    Current Outpatient Prescriptions  Medication Sig Dispense Refill  . acetaminophen (TYLENOL) 325 MG tablet Take 2 tablets (650 mg total) by mouth every 6 (six) hours as needed (or Fever >/= 101).  60 tablet  0  . ALPRAZolam (XANAX) 0.5 MG tablet Take 0.25-0.5 mg by mouth at bedtime as needed. Sleep      . Azelastine HCl 0.15 % SOLN Place 2 sprays into the nose 2 (two) times daily.  30 mL  3  . betamethasone dipropionate (DIPROLENE) 0.05 % ointment Apply 1 application topically daily as needed.       . cetirizine (ZYRTEC) 10 MG tablet Take 10 mg by mouth daily.      Marland Kitchen diltiazem (CARDIZEM CD) 120 MG 24 hr capsule Take 1 capsule (120 mg total) by mouth daily.  90 capsule  3  . diphenoxylate-atropine (LOMOTIL) 2.5-0.025 MG per tablet Take 1-2  tablets by mouth 3 (three) times daily as needed. diarrhea      . dronedarone (MULTAQ) 400 MG tablet Take 1 tablet (400 mg total) by mouth 2 (two) times daily with a meal.  180 tablet  3  . levalbuterol (XOPENEX HFA) 45 MCG/ACT inhaler Inhale 1-2 puffs into the lungs every 4 (four) hours as needed for wheezing or shortness of breath. Wheezing  1 Inhaler  11  . levothyroxine (SYNTHROID, LEVOTHROID) 125 MCG tablet Take 125 mcg by mouth every morning.      . methylcellulose (ARTIFICIAL TEARS) 1 % ophthalmic solution Place 1 drop into both eyes 2 (two) times daily as needed. Dry eyes      . metoprolol (LOPRESSOR) 25 MG tablet Take 1 tablet (25 mg total) by mouth as needed (use one tablet PO if you have any palpitations.  If you do not feel improved within 30 minutes return to the Emergency Department).  14 tablet  0  . omeprazole (PRILOSEC) 20 MG capsule Take 1 capsule (20 mg total) by mouth daily.  30 capsule  11  . tiotropium (SPIRIVA) 18 MCG inhalation capsule Place 1 capsule (18 mcg total) into inhaler and inhale daily.  90 capsule  3  . XARELTO 20 MG TABS Take 1 tablet by mouth daily.      . [DISCONTINUED] Calcium Carbonate (  CALCIUM 600) 1500 MG TABS Take 2 tablets by mouth daily.         No current facility-administered medications for this visit.    BP 124/50  Pulse 76  Ht 5' 5"  (1.651 m)  Wt 80.015 kg (176 lb 6.4 oz)  BMI 29.35 kg/m2  SpO2 95% General: NAD Neck: No JVD, no thyromegaly or thyroid nodule.  Lungs: Prolonged respiratory phase CV: Nondisplaced PMI.  Heart regular S1/S2, no S3/S4, no murmur.  No peripheral edema.  No carotid bruit.  Normal pedal pulses.  Abdomen: Soft, nontender, no hepatosplenomegaly, no distention.   Neurologic: Alert and oriented x 3.  Psych: Normal affect. Extremities: No clubbing or cyanosis.   Assessment/Plan: 1. Chest pain: Atypical with one prolonged episode at rest in 12/13.  No further chest pain.  Risk factors include family history (brother  with MI) and prior smoking.  Lexiscan myoview was normal in 12/13.  no further workup needed at this time.  2. Atrial fibrillation: Paroxysmal atrial fibrillation, quite symptomatic (ER visits).  She is in NSR today on dronedarone, diltiazem CD, and Xarelto.  She will continue this regimen.  She has not had the symptoms of atrial fibrillation since starting on dronedarone.  Will check CBC and BMET today given Xarelto use.  [name] 06/02/2013

## 2013-06-04 ENCOUNTER — Telehealth: Payer: Self-pay | Admitting: Cardiology

## 2013-06-04 NOTE — Telephone Encounter (Signed)
I do not think that amiodarone would be a good long-term medication for her.  Have her get monthly supplies if she can afford that and we can see if this can be appealed (send message to [place]).

## 2013-06-04 NOTE — Telephone Encounter (Signed)
Amiodarone has a lot more side effects.  Would prefer Multaq.  Can she get it month by month and can we get her in an assistance program (copay card, etc)? Do we have samples?

## 2013-06-04 NOTE — Telephone Encounter (Signed)
Spoke with patient who states that 90 day supply of Multaq would cost > $400.  [employer] Rx plan sent patient letter suggesting Amiodarone in the place of Multaq.  Patient would like to know what Dr. Shirlee Latch wants her to do.  I advised patient that I am routing message to Dr. Shirlee Latch for advice. Patient verbalized understanding and agreement with plan.

## 2013-06-04 NOTE — Telephone Encounter (Signed)
New Prob    Pt is requesting an alternative for MULTAQ due to high cost.

## 2013-06-04 NOTE — Telephone Encounter (Signed)
We do not have samples of Multaq or discount cards. Spoke with Weston Brass PharmD and she advised that patient would not be eligible for an assistance program since she has insurance. Will send message back to Dr.McLean for advice.

## 2013-06-05 NOTE — Telephone Encounter (Signed)
F/up  Pt following up on medication// pt states she only has enough medication for 2 more days.

## 2013-06-05 NOTE — Telephone Encounter (Signed)
Spoke with patient and explained that Dr. Shirlee Latch does not want patient to be on Amiodarone.  I asked patient if she could get one month supply of Multaq until [place] can appeal to [employer].  Patient states she can afford one month supply and will get that filled and wait to hear from  [place].  I advised that I am routing message to Katina Dung, RN for follow up next week.  Patient verbalized understanding and agreement with plan of care.

## 2013-06-09 ENCOUNTER — Other Ambulatory Visit (INDEPENDENT_AMBULATORY_CARE_PROVIDER_SITE_OTHER): Payer: Medicare Other

## 2013-06-09 ENCOUNTER — Ambulatory Visit (INDEPENDENT_AMBULATORY_CARE_PROVIDER_SITE_OTHER): Payer: Medicare Other

## 2013-06-09 DIAGNOSIS — I4891 Unspecified atrial fibrillation: Secondary | ICD-10-CM

## 2013-06-09 LAB — BASIC METABOLIC PANEL
CO2: 23 mEq/L (ref 19–32)
Chloride: 104 mEq/L (ref 96–112)
Glucose, Bld: 100 mg/dL — ABNORMAL HIGH (ref 70–99)
Potassium: 5 mEq/L (ref 3.5–5.1)
Sodium: 132 mEq/L — ABNORMAL LOW (ref 135–145)

## 2013-06-09 LAB — CBC WITH DIFFERENTIAL/PLATELET
Eosinophils Relative: 4.7 % (ref 0.0–5.0)
HCT: 41.7 % (ref 36.0–46.0)
Lymphocytes Relative: 19.5 % (ref 12.0–46.0)
Lymphs Abs: 1.3 10*3/uL (ref 0.7–4.0)
Monocytes Relative: 9.7 % (ref 3.0–12.0)
Neutrophils Relative %: 65.8 % (ref 43.0–77.0)
Platelets: 220 10*3/uL (ref 150.0–400.0)
WBC: 6.9 10*3/uL (ref 4.5–10.5)

## 2013-06-09 NOTE — Telephone Encounter (Signed)
I spoke with Florentina Addison at [phone] [facility] prescription customer service. She states pt's coverage limit is $2850-$4550. Pt has used $2672 of her coverage limit for the year. Once she reaches $4550 she will only be responsible for 5% of cost of med, for Multaq about $20. [employer] will not grant an exception for medication, but suggest alternatives (amiodarone). I spoke with pt. She is aware Dr Shirlee Latch does not recommend amiodarone for Multaq. She states she will continue Mutlaq.

## 2013-06-09 NOTE — Patient Instructions (Addendum)
A full discussion of the nature of anticoagulants has been carried out.  A benefit/risk analysis has been presented to the patient, so that they understand the justification for choosing anticoagulation with Xarelto at this time.  The need for compliance is stressed.  Pt is aware to take the medication once daily with the largest meal of the day.  Side effects of potential bleeding are discussed, including unusual colored urine or stools, coughing up blood or coffee ground emesis, nose bleeds or serious fall or head trauma.  Discussed signs and symptoms of stroke. The patient should avoid any OTC items containing aspirin or ibuprofen.  Avoid alcohol consumption.   Call if any signs of abnormal bleeding.  Discussed financial obligations and resolved any difficulty in obtaining medication.  Next lab test test in 6 months.    

## 2013-06-09 NOTE — Progress Notes (Signed)
Pt was started on Xarelto 38m QD for atrial fibrillation on April 25, 2013 by Dr MAundra Dubin    Reviewed patients medication list.  Pt is not currently on any combined P-gp and strong CYP3A4 inhibitors/inducers (ketoconazole, traconazole, ritonavir, carbamazepine, phenytoin, rifampin, St. John's wort).  Reviewed labs.  SCr 1.0 , Weight 80kg, CrCl-58.6 .  Dose appropriate based on CrCl.   Hgb and HCT Within Normal Limits 14.4/41.7 on 06/10/13. Follow-up in 6 months. Pt states she is to follow-up with Dr MAundra Dubinin 6 months will make Xarelto f/u at the same time.

## 2013-06-09 NOTE — Telephone Encounter (Signed)
Spoke with patient. Pt states she got a 30 day prescription for Multaq from [facility] for $45. She states future prescriptions will put her in the doughnut hole and it will cost more than $400 for 90 days. She has asked me to call [facility] customer service or pharmacy [phone] ID # [phone].

## 2013-06-12 ENCOUNTER — Other Ambulatory Visit: Payer: Self-pay | Admitting: Neurology

## 2013-06-12 ENCOUNTER — Encounter: Payer: Self-pay | Admitting: Neurology

## 2013-06-12 ENCOUNTER — Ambulatory Visit (INDEPENDENT_AMBULATORY_CARE_PROVIDER_SITE_OTHER): Payer: Medicare Other | Admitting: Neurology

## 2013-06-12 VITALS — BP 122/62 | HR 76 | Ht 65.0 in | Wt 177.0 lb

## 2013-06-12 DIAGNOSIS — R269 Unspecified abnormalities of gait and mobility: Secondary | ICD-10-CM

## 2013-06-12 DIAGNOSIS — M47812 Spondylosis without myelopathy or radiculopathy, cervical region: Secondary | ICD-10-CM

## 2013-06-12 DIAGNOSIS — E559 Vitamin D deficiency, unspecified: Secondary | ICD-10-CM

## 2013-06-12 DIAGNOSIS — R2681 Unsteadiness on feet: Secondary | ICD-10-CM

## 2013-06-12 DIAGNOSIS — G609 Hereditary and idiopathic neuropathy, unspecified: Secondary | ICD-10-CM

## 2013-06-12 NOTE — Patient Instructions (Signed)
Overall you are doing fairly well but I do want to suggest a few things today:   Remember to drink plenty of fluid, eat healthy meals and do not skip any meals. Try to eat protein with a every meal and eat a healthy snack such as fruit or nuts in between meals. Try to keep a regular sleep-wake schedule and try to exercise daily, particularly in the form of walking, 20-30 minutes a day, if you can.   As far as diagnostic testing: I would like to get a MRI of your head and cervical spine. I would also like to get some blood work to check for neuropathy  I would like you to follow up with physical therapy to work on your walking and to help prevent falls.  I would like to see you back once the workup is completed, sooner if we need to. Please call us with any interim questions, concerns, problems, updates or refill requests.   Please also call us for any test results so we can go over those with you on the phone.  My clinical assistant and will answer any of your questions and relay your messages to me and also relay most of my messages to you.   Our phone number is [phone]. We also have an after hours call service for urgent matters and there is a physician on-call for urgent questions. For any emergencies you know to call 911 or go to the nearest emergency room

## 2013-06-12 NOTE — Progress Notes (Signed)
[facility]  Provider:  Dr Hosie Poisson Referring Provider: [name], MD Primary Care Physician:  [name], MD  CC:  Neck and head pain  HPI:  [name] is a 79 y.o. female here as a referral from Dr. Ouida Sills for gait instability  She reports the symptoms began a few months ago, she reports it did not start acutely. Describes a sensation of feeling off balance, feeling she is going to tip over, most often tipping forward. Describes difficulty does feel unsteady on her feet. Denies any vertigo or dizziness. Denies difficulty sensing where her feet on the ground. She reports is difficult to describe but she just feels unsteady. She notes it is worse in open spaces, such as outdoors where she does not have something to hold on. Has begun to walk with a cane for assistance. Denies having had any falls, but reports she has had some close calls. Has altered her gait and not walking as much. Her daughter notes she is taking smaller more cautious status. Denies any noticeable shuffling steps. Denies any slowing down of her walking, slowing down of movements, increased stiffness. Does have chronic cervical neck pain which she feels is getting worse. Denies any change in her vision, no lightheadedness upon standing, no graying out of her vision, no palpitations with these events. Was recently diagnosed with atrial flutter and started on anticoagulation.  Has history of bilateral knee replacement.    No prior history of symptoms, no prior stroke or TIA, no focal weakness or sensory changes. No family history of any neural degenerative process.  Review of Systems: Out of a complete 14 system review, the patient complains of only the following symptoms, and all other reviewed systems are negative. For shortness of breath diarrhea dizziness psoriasis  History   Social History  . Marital Status: Single    Spouse Name: N/A    Number of Children: 1  . Years of Education: 12    Occupational History  . retired    Social History Main Topics  . Smoking status: Former Smoker -- 1.00 packs/day for 20 years    Types: Cigarettes    Quit date: 10/08/1980  . Smokeless tobacco: Never Used  . Alcohol Use: Yes     Comment: rarely  . Drug Use: No  . Sexual Activity: Not on file   Other Topics Concern  . Not on file   Social History Narrative   Patient lives at home by herself.    Patient is retired.    Patient has 12 th grade education.           Family History  Problem Relation Age of Onset  . Heart disease Father     pacemaker  . Lung cancer Father   . Colon cancer Mother     Past Medical History  Diagnosis Date  . Hypothyroidism   . Psoriatic arthritis   . IBS (irritable bowel syndrome)   . Hemorrhoids   . Diverticulosis   . Rectal polyp   . GERD (gastroesophageal reflux disease)   . Family hx of colon cancer   . Uterine polyp   . Arthritis   . COPD (chronic obstructive pulmonary disease)   . Lymphocytic colitis     Past Surgical History  Procedure Laterality Date  . Knee surgery      lt.  Marland Kitchen Uterine polypectomy    . Cholecystectomy  Dec 04, 2010  . Total knee revision  08/06/2012    Procedure: TOTAL KNEE  REVISION;  Surgeon: Loanne Drilling, MD;  Location: [facility];  Service: Orthopedics;  Laterality: Left;  Left Total Knee Arthroplasty Revision    Current Outpatient Prescriptions  Medication Sig Dispense Refill  . acetaminophen (TYLENOL) 325 MG tablet Take 2 tablets (650 mg total) by mouth every 6 (six) hours as needed (or Fever >/= 101).  60 tablet  0  . ALPRAZolam (XANAX) 0.5 MG tablet Take 0.25-0.5 mg by mouth at bedtime as needed. Sleep      . Azelastine HCl 0.15 % SOLN Place 2 sprays into the nose 2 (two) times daily.  30 mL  3  . betamethasone dipropionate (DIPROLENE) 0.05 % ointment Apply 1 application topically daily as needed.       . cetirizine (ZYRTEC) 10 MG tablet Take 10 mg by mouth daily.      Marland Kitchen diltiazem (CARDIZEM CD)  120 MG 24 hr capsule Take 1 capsule (120 mg total) by mouth daily.  90 capsule  3  . diphenoxylate-atropine (LOMOTIL) 2.5-0.025 MG per tablet Take 1-2 tablets by mouth 3 (three) times daily as needed. diarrhea      . dronedarone (MULTAQ) 400 MG tablet Take 1 tablet (400 mg total) by mouth 2 (two) times daily with a meal.  180 tablet  3  . levalbuterol (XOPENEX HFA) 45 MCG/ACT inhaler Inhale 1-2 puffs into the lungs every 4 (four) hours as needed for wheezing or shortness of breath. Wheezing  1 Inhaler  11  . levothyroxine (SYNTHROID, LEVOTHROID) 125 MCG tablet Take 125 mcg by mouth every morning.      . methylcellulose (ARTIFICIAL TEARS) 1 % ophthalmic solution Place 1 drop into both eyes 2 (two) times daily as needed. Dry eyes      . metoprolol (LOPRESSOR) 25 MG tablet Take 1 tablet (25 mg total) by mouth as needed (use one tablet PO if you have any palpitations.  If you do not feel improved within 30 minutes return to the Emergency Department).  14 tablet  0  . omeprazole (PRILOSEC) 20 MG capsule Take 1 capsule (20 mg total) by mouth daily.  30 capsule  11  . tiotropium (SPIRIVA) 18 MCG inhalation capsule Place 1 capsule (18 mcg total) into inhaler and inhale daily.  90 capsule  3  . XARELTO 20 MG TABS Take 1 tablet by mouth daily.      . [DISCONTINUED] Calcium Carbonate (CALCIUM 600) 1500 MG TABS Take 2 tablets by mouth daily.         No current facility-administered medications for this visit.    Allergies as of 06/12/2013 - Review Complete 06/12/2013  Allergen Reaction Noted  . Codeine Itching     Vitals: BP 122/62  Pulse 76  Ht 5\' 5"  (1.651 m)  Wt 177 lb (80.287 kg)  BMI 29.45 kg/m2 Last Weight:  Wt Readings from Last 1 Encounters:  06/12/13 177 lb (80.287 kg)   Last Height:   Ht Readings from Last 1 Encounters:  06/12/13 5\' 5"  (1.651 m)     Physical exam: Exam: Gen: NAD, conversant Eyes: anicteric sclerae, moist conjunctivae HENT: Atraumati Neck: Trachea midline;  supple,  Lungs: CTA, no wheezing, rales, rhonic                          CV: RRR, no MRG Abdomen: Soft, non-tender;  Extremities: No peripheral edema  Skin: Normal temperature, no rash,  Psych: Appropriate affect, pleasant  Neuro: MS: AA&Ox3, appropriately interactive, normal affect  Speech: fluent w/o paraphasic error  Memory: good recent and remote recall  CN: PERRL, EOMI no nystagmus, no ptosis, sensation intact to LT V1-V3 bilat, face symmetric, no weakness, hearing grossly intact, palate elevates symmetrically, shoulder shrug 5/5 bilat,  tongue protrudes midline, no fasiculations noted.  Motor: normal bulk and tone Strength: 5/5  In all extremities  Coord: rapid alternating and point-to-point (FNF, HTS) movements intact.  Reflexes: symmetrical, bilat downgoing toes  Sens: LT intact in all extremities, decreased vibration to midshin bilat LE, normal PP, temp and proprioception  Gait: upright, slightly wide based, slow, unsteady, unable to tandem, wobbles but does not fall with Rhomberg   Assessment:  After physical and neurologic examination, review of laboratory studies, imaging, neurophysiology testing and pre-existing records, assessment will be reviewed on the problem list.  Plan:  Treatment plan and additional workup will be reviewed under Problem List.   Ms Wagoner is a pleasant 79 year old woman sent for initial evaluation of gait instability for the past few months. She notes difficulty with walking, feeling unsteady on her feet, there has been no falls but she feels is in close calls. Has begun to walk with the assistance of a cane. Physical exam is pertinent for mild unsteadiness and gait, slightly wide based and were unable to tandem, wobbles with Romberg but does not fall. No signs of orthostatic hypotension on blood pressure check. Patient denies any vertigo. Unclear etiology symptoms. Differential would include an ischemic process, degenerative spinal cord  process versus possible peripheral neuropathy.  1)Gait disorder  -MRI brain and C spine -lab work for possible peripheral neuropathy -referral to PT for gait training -follow up when workup complete

## 2013-06-13 LAB — VITAMIN B12: Vitamin B-12: 403 pg/mL (ref 211–911)

## 2013-06-13 LAB — HEMOGLOBIN A1C
Hgb A1c MFr Bld: 5.7 % — ABNORMAL HIGH (ref <5.7)
Mean Plasma Glucose: 117 mg/dL — ABNORMAL HIGH (ref <117)

## 2013-06-13 LAB — VITAMIN D 25 HYDROXY (VIT D DEFICIENCY, FRACTURES): Vit D, 25-Hydroxy: 17 ng/mL — ABNORMAL LOW (ref 30–89)

## 2013-06-16 ENCOUNTER — Telehealth: Payer: Self-pay | Admitting: Internal Medicine

## 2013-06-16 LAB — PROTEIN ELECTROPHORESIS, SERUM
Albumin ELP: 53.2 % — ABNORMAL LOW (ref 55.8–66.1)
Alpha-1-Globulin: 7.7 % — ABNORMAL HIGH (ref 2.9–4.9)
Total Protein, Serum Electrophoresis: 6.9 g/dL (ref 6.0–8.3)

## 2013-06-16 MED ORDER — IPRATROPIUM BROMIDE 0.02 % IN SOLN: 500.0000 ug | Freq: Four times a day (QID) | RESPIRATORY_TRACT | Status: DC

## 2013-06-16 NOTE — Telephone Encounter (Signed)
Spoke with patient-- Patient states she would like to try the ipratropium neb QID Says she does not think they would approve her for financial assistance, advised that we could try Patient would like to go ahead with nebulizer Rx has been sent to [facility] in [place] as requested and nothing further needed at this time

## 2013-06-16 NOTE — Telephone Encounter (Signed)
Spoke with patient-- Patient states she is taking Spiriva, Multaq, Xarelto and several other "expensive" medications. Patient says she just cant afford all of these,  Requesting an alternative, less expensive medication to the spiriva Dr. Delton Coombes please advise, thank you!  Last OV: 03/20/13 with 6 month follow up not scheduled at this time

## 2013-06-16 NOTE — Telephone Encounter (Signed)
The only cheaper alternative would be ipratropium nebs. You can check on the cost of this QID and see if she wants to make that change. I do not believe this will be as effective as the spiriva. Can she get financial assistance?

## 2013-06-17 ENCOUNTER — Ambulatory Visit: Payer: Medicare Other | Attending: Neurology

## 2013-06-17 DIAGNOSIS — R279 Unspecified lack of coordination: Secondary | ICD-10-CM | POA: Insufficient documentation

## 2013-06-17 DIAGNOSIS — IMO0001 Reserved for inherently not codable concepts without codable children: Secondary | ICD-10-CM | POA: Insufficient documentation

## 2013-06-17 DIAGNOSIS — R262 Difficulty in walking, not elsewhere classified: Secondary | ICD-10-CM | POA: Insufficient documentation

## 2013-06-18 ENCOUNTER — Telehealth: Payer: Self-pay | Admitting: Emergency Medicine

## 2013-06-18 MED ORDER — IPRATROPIUM BROMIDE 0.02 % IN SOLN: 500.0000 ug | Freq: Four times a day (QID) | RESPIRATORY_TRACT | Status: DC

## 2013-06-18 NOTE — Telephone Encounter (Signed)
I spoke with pt. Confirmed she is needing her ipratropium neb sent to [facility]. I have sent this in and nothing further needed

## 2013-06-19 ENCOUNTER — Telehealth: Payer: Self-pay | Admitting: Emergency Medicine

## 2013-06-19 ENCOUNTER — Ambulatory Visit: Payer: Medicare Other | Admitting: Rehabilitative and Restorative Service Providers"

## 2013-06-19 DIAGNOSIS — J449 Chronic obstructive pulmonary disease, unspecified: Secondary | ICD-10-CM

## 2013-06-19 NOTE — Telephone Encounter (Signed)
I spoke with pt and she is needing her nebulizer machine RX sent to [facility]. She made the mistake yesterday by saying she need the medication. I have placed order.

## 2013-06-23 ENCOUNTER — Ambulatory Visit: Payer: Medicare Other

## 2013-06-25 ENCOUNTER — Ambulatory Visit: Payer: Medicare Other

## 2013-06-26 ENCOUNTER — Ambulatory Visit: Payer: Medicare Other

## 2013-06-29 ENCOUNTER — Telehealth: Payer: Self-pay | Admitting: Cardiology

## 2013-06-29 MED ORDER — WARFARIN SODIUM 5 MG PO TABS: 5.0000 mg | ORAL_TABLET | Freq: Every day | ORAL | Status: DC

## 2013-06-29 NOTE — Telephone Encounter (Signed)
Spoke with pt.  She will switch over to Coumadin 5mg  daily.  Will have her overlap Xarelto and Coumadin x 3 days then continue just Coumadin.  (She will pick up samples at the [facility] office) New Coumadin appt made in [place] for next Monday.

## 2013-06-29 NOTE — Telephone Encounter (Signed)
That would be ok if necessary but would have her consider copay to come in to get INR done.  Can we try to get her copay card, financial assistance, etc? Would Eliquis be less expensive?

## 2013-06-29 NOTE — Telephone Encounter (Signed)
The pt called into the office because she would like to change from Xarelto to Coumadin due to cost of medication. I will forward this information to Dr Aundra Dubin and his nurse to review and contact the pt later this week with follow-up.

## 2013-06-29 NOTE — Telephone Encounter (Signed)
Pt states both Xarelto and Eliquis will cost her more than $400 for a 3 month supply. I confirmed pt has Medicare Part D prescription benefit, according to Xarelto savings card info she is not eligible for savings card.  She states she does not have to pay a copay for INR checks. She is aware I am forwarding this to [facility] to contact her about changing over to coumadin. She took her last Xarelto today. Please call her at [phone].

## 2013-06-30 ENCOUNTER — Ambulatory Visit: Payer: Medicare Other | Admitting: Physical Therapy

## 2013-07-02 ENCOUNTER — Ambulatory Visit
Admission: RE | Admit: 2013-07-02 | Discharge: 2013-07-02 | Disposition: A | Discharge status: Home / Self Care | Payer: Medicare Other | Source: Ambulatory Visit | Attending: Neurology | Admitting: Neurology

## 2013-07-02 ENCOUNTER — Ambulatory Visit: Payer: Medicare Other

## 2013-07-02 DIAGNOSIS — R2681 Unsteadiness on feet: Secondary | ICD-10-CM

## 2013-07-02 DIAGNOSIS — M47812 Spondylosis without myelopathy or radiculopathy, cervical region: Secondary | ICD-10-CM

## 2013-07-02 DIAGNOSIS — R269 Unspecified abnormalities of gait and mobility: Secondary | ICD-10-CM

## 2013-07-06 NOTE — Progress Notes (Signed)
Quick Note:  Spoke to patient and she is scheduled for 07-13-13 at 1015 to discuss results, per Dr. Hosie Poisson. ______

## 2013-07-07 ENCOUNTER — Ambulatory Visit: Payer: Medicare Other

## 2013-07-09 ENCOUNTER — Ambulatory Visit: Payer: Medicare Other | Attending: Neurology | Admitting: Physical Therapy

## 2013-07-09 DIAGNOSIS — R262 Difficulty in walking, not elsewhere classified: Secondary | ICD-10-CM | POA: Insufficient documentation

## 2013-07-09 DIAGNOSIS — IMO0001 Reserved for inherently not codable concepts without codable children: Secondary | ICD-10-CM | POA: Insufficient documentation

## 2013-07-09 DIAGNOSIS — R279 Unspecified lack of coordination: Secondary | ICD-10-CM | POA: Insufficient documentation

## 2013-07-13 ENCOUNTER — Encounter: Payer: Self-pay | Admitting: Neurology

## 2013-07-13 ENCOUNTER — Ambulatory Visit: Payer: Medicare Other | Admitting: Physical Therapy

## 2013-07-13 ENCOUNTER — Ambulatory Visit (INDEPENDENT_AMBULATORY_CARE_PROVIDER_SITE_OTHER): Payer: Medicare Other | Admitting: Neurology

## 2013-07-13 VITALS — BP 105/52 | HR 72 | Ht 65.5 in | Wt 180.0 lb

## 2013-07-13 DIAGNOSIS — R269 Unspecified abnormalities of gait and mobility: Secondary | ICD-10-CM

## 2013-07-13 DIAGNOSIS — M5090 Cervical disc disorder, unspecified, unspecified cervical region: Secondary | ICD-10-CM

## 2013-07-13 DIAGNOSIS — R2681 Unsteadiness on feet: Secondary | ICD-10-CM

## 2013-07-13 NOTE — Progress Notes (Signed)
[facility]  Provider:  Dr Hosie Poisson Referring Provider: [name], MD Primary Care Physician:  [name], MD  CC:  Neck and head pain  HPI:  [name] is a 79 y.o. female here as a follow up of gait instability with last visit on 06/12/2013. At that time had MRI brain/C spine and lab work ordered. Imaging reviewed and MRI C spine pertinent for multi level degenerative changes. Official read below. Lab work also pertinent for decreased Vitamin D and elevated HbA1c. She has also been working with PT.   Continues to have difficulty with gait. Feels unsteady on her feet. Continues to use a cane when walking long distance. Continues to have neck pain, most notable with head rotation. Notes some pain in bilateral shoulder region. No noted weakness, no sensory loss, no change in bowel/bladder.   Per last visit from 06/12/13: Ms Mickelsen is a pleasant 79 year old woman sent for initial evaluation of gait instability for the past few months. She notes difficulty with walking, feeling unsteady on her feet, there has been no falls but she feels is in close calls. Has begun to walk with the assistance of a cane. Physical exam is pertinent for mild unsteadiness and gait, slightly wide based and were unable to tandem, wobbles with Romberg but does not fall. No signs of orthostatic hypotension on blood pressure check. Patient denies any vertigo. Unclear etiology symptoms. Differential would include an ischemic process, degenerative spinal cord process versus possible peripheral neuropathy.  MRI C spine: Abnormal MRI cervical spine (without) demonstrating:  1. At C5-6, C6-7: disc bulging with uncovertebral joint hypertrophy with severe biforaminal foraminal stenosis  2. At C7-T1: facet hypertrophy with moderate-severe biforaminal foraminal stenosis  3. No intrinsic or compressive spinal cord lesions  Review of Systems: Out of a complete 14 system review, the patient complains of only the  following symptoms, and all other reviewed systems are negative. Positive for neck pain and balance difficulty  History   Social History  . Marital Status: Single    Spouse Name: N/A    Number of Children: 1  . Years of Education: 12   Occupational History  . retired    Social History Main Topics  . Smoking status: Former Smoker -- 1.00 packs/day for 20 years    Types: Cigarettes    Quit date: 10/08/1980  . Smokeless tobacco: Never Used  . Alcohol Use: Yes     Comment: rarely  . Drug Use: No  . Sexual Activity: Not on file   Other Topics Concern  . Not on file   Social History Narrative   Patient lives at home by herself.    Patient is retired.    Patient has 12 th grade education.           Family History  Problem Relation Age of Onset  . Heart disease Father     pacemaker  . Lung cancer Father   . Colon cancer Mother     Past Medical History  Diagnosis Date  . Hypothyroidism   . Psoriatic arthritis   . IBS (irritable bowel syndrome)   . Hemorrhoids   . Diverticulosis   . Rectal polyp   . GERD (gastroesophageal reflux disease)   . Family hx of colon cancer   . Uterine polyp   . Arthritis   . COPD (chronic obstructive pulmonary disease)   . Lymphocytic colitis     Past Surgical History  Procedure Laterality Date  . Knee surgery  lt.  . Uterine polypectomy    . Cholecystectomy  Dec 04, 2010  . Total knee revision  08/06/2012    Procedure: TOTAL KNEE REVISION;  Surgeon: Loanne Drilling, MD;  Location: [facility];  Service: Orthopedics;  Laterality: Left;  Left Total Knee Arthroplasty Revision    Current Outpatient Prescriptions  Medication Sig Dispense Refill  . acetaminophen (TYLENOL) 325 MG tablet Take 2 tablets (650 mg total) by mouth every 6 (six) hours as needed (or Fever >/= 101).  60 tablet  0  . ALPRAZolam (XANAX) 0.5 MG tablet Take 0.25-0.5 mg by mouth at bedtime as needed. Sleep      . Azelastine HCl 0.15 % SOLN Place 2 sprays into the  nose 2 (two) times daily.  30 mL  3  . betamethasone dipropionate (DIPROLENE) 0.05 % ointment Apply 1 application topically daily as needed.       . cetirizine (ZYRTEC) 10 MG tablet Take 10 mg by mouth daily.      Marland Kitchen diltiazem (CARDIZEM CD) 120 MG 24 hr capsule Take 1 capsule (120 mg total) by mouth daily.  90 capsule  3  . diphenoxylate-atropine (LOMOTIL) 2.5-0.025 MG per tablet Take 1-2 tablets by mouth 3 (three) times daily as needed. diarrhea      . dronedarone (MULTAQ) 400 MG tablet Take 1 tablet (400 mg total) by mouth 2 (two) times daily with a meal.  180 tablet  3  . ipratropium (ATROVENT) 0.02 % nebulizer solution Take 2.5 mLs (500 mcg total) by nebulization 4 (four) times daily. DX 496  300 mL  12  . levalbuterol (XOPENEX HFA) 45 MCG/ACT inhaler Inhale 1-2 puffs into the lungs every 4 (four) hours as needed for wheezing or shortness of breath. Wheezing  1 Inhaler  11  . levothyroxine (SYNTHROID, LEVOTHROID) 125 MCG tablet Take 125 mcg by mouth every morning.      . methylcellulose (ARTIFICIAL TEARS) 1 % ophthalmic solution Place 1 drop into both eyes 2 (two) times daily as needed. Dry eyes      . metoprolol (LOPRESSOR) 25 MG tablet Take 1 tablet (25 mg total) by mouth as needed (use one tablet PO if you have any palpitations.  If you do not feel improved within 30 minutes return to the Emergency Department).  14 tablet  0  . nitrofurantoin, macrocrystal-monohydrate, (MACROBID) 100 MG capsule       . omeprazole (PRILOSEC) 20 MG capsule Take 1 capsule (20 mg total) by mouth daily.  30 capsule  11  . warfarin (COUMADIN) 5 MG tablet Take 1 tablet (5 mg total) by mouth daily.  30 tablet  1  . XARELTO 20 MG TABS tablet Last day on this med will be 07/14/13      . [DISCONTINUED] Calcium Carbonate (CALCIUM 600) 1500 MG TABS Take 2 tablets by mouth daily.         No current facility-administered medications for this visit.    Allergies as of 07/13/2013 - Review Complete 07/13/2013  Allergen  Reaction Noted  . Codeine Itching     Vitals: BP 105/52  Pulse 72  Ht 5' 5.5" (1.664 m)  Wt 180 lb (81.647 kg)  BMI 29.49 kg/m2 Last Weight:  Wt Readings from Last 1 Encounters:  07/13/13 180 lb (81.647 kg)   Last Height:   Ht Readings from Last 1 Encounters:  07/13/13 5' 5.5" (1.664 m)     Physical exam: Exam: Gen: NAD, conversant Eyes: anicteric sclerae, moist conjunctivae HENT: Atraumati Neck:  Trachea midline; supple,  Lungs: CTA, no wheezing, rales, rhonic                          CV: RRR, no MRG Abdomen: Soft, non-tender;  Extremities: No peripheral edema  Skin: Normal temperature, no rash,  Psych: Appropriate affect, pleasant  Neuro: MS: AA&Ox3, appropriately interactive, normal affect   Speech: fluent w/o paraphasic error  Memory: good recent and remote recall  CN: PERRL, EOMI no nystagmus, no ptosis, sensation intact to LT V1-V3 bilat, face symmetric, no weakness, hearing grossly intact, palate elevates symmetrically, shoulder shrug 5/5 bilat,  tongue protrudes midline, no fasiculations noted.  Motor: normal bulk and tone Strength: 5/5  In all extremities except for mild bilat deltoid 5-/5 weakness  Coord: rapid alternating and point-to-point (FNF, HTS) movements intact.  Reflexes: symmetrical, bilat downgoing toes  Sens: LT intact in all extremities, decreased vibration to midshin bilat LE, normal PP, temp and proprioception  Gait: upright, slightly wide based, slow, unsteady, unable to tandem, wobbles but does not fall with Rhomberg   Assessment:  After physical and neurologic examination, review of laboratory studies, imaging, neurophysiology testing and pre-existing records, assessment will be reviewed on the problem list.  Plan:  Treatment plan and additional workup will be reviewed under Problem List.   Ms Koble is a pleasant 79 year old woman sent for follow up evaluation of gait instability and neck painfor the past few months. Imaging  showed multilevel degenerative changes. Physical exam is pertinent for mild unsteadiness and gait, slightly wide based and were unable to tandem, wobbles with Romberg but does not fall. No signs of orthostatic hypotension on blood pressure check. Patient denies any vertigo.   1)Gait disorder 2)Cervical neck pain  -continue PT -will refer to [facility] for evaluation -follow up when workup complete

## 2013-07-13 NOTE — Patient Instructions (Addendum)
Overall you are doing fairly well but I do want to suggest a few things today:   Please continue with physical therapy.  I placed a referral with neurosurgery. Please follow up with our office after you have this appointment.    Please call us with any interim questions, concerns, problems, updates or refill requests.   My clinical assistant and will answer any of your questions and relay your messages to me and also relay most of my messages to you.   Our phone number is [phone]. We also have an after hours call service for urgent matters and there is a physician on-call for urgent questions. For any emergencies you know to call 911 or go to the nearest emergency room

## 2013-07-15 ENCOUNTER — Ambulatory Visit (INDEPENDENT_AMBULATORY_CARE_PROVIDER_SITE_OTHER): Payer: Medicare Other | Admitting: *Deleted

## 2013-07-15 ENCOUNTER — Ambulatory Visit: Payer: Medicare Other

## 2013-07-15 DIAGNOSIS — I4891 Unspecified atrial fibrillation: Secondary | ICD-10-CM

## 2013-07-17 ENCOUNTER — Ambulatory Visit: Payer: Medicare Other

## 2013-07-20 ENCOUNTER — Ambulatory Visit (INDEPENDENT_AMBULATORY_CARE_PROVIDER_SITE_OTHER): Payer: Medicare Other | Admitting: *Deleted

## 2013-07-20 ENCOUNTER — Ambulatory Visit: Payer: Self-pay | Admitting: Neurology

## 2013-07-20 ENCOUNTER — Telehealth: Payer: Self-pay | Admitting: Pediatrics

## 2013-07-20 DIAGNOSIS — I4891 Unspecified atrial fibrillation: Secondary | ICD-10-CM

## 2013-07-20 LAB — POCT INR: INR: 2.3

## 2013-07-20 NOTE — Telephone Encounter (Signed)
completed

## 2013-07-21 ENCOUNTER — Ambulatory Visit: Payer: Medicare Other

## 2013-07-21 ENCOUNTER — Telehealth: Payer: Self-pay | Admitting: Emergency Medicine

## 2013-07-21 NOTE — Telephone Encounter (Signed)
I have this form. I have filled out this form but I am waiting for RB to sign these forms when he comes back to the office.

## 2013-07-23 NOTE — Telephone Encounter (Signed)
RB signed these forms and I faxed them to [facility]. Pt notified this was done

## 2013-07-24 ENCOUNTER — Ambulatory Visit: Payer: Medicare Other

## 2013-07-28 ENCOUNTER — Ambulatory Visit: Payer: Medicare Other | Admitting: Physical Therapy

## 2013-07-30 ENCOUNTER — Ambulatory Visit: Payer: Medicare Other | Admitting: Physical Therapy

## 2013-07-30 ENCOUNTER — Ambulatory Visit (INDEPENDENT_AMBULATORY_CARE_PROVIDER_SITE_OTHER): Payer: Medicare Other | Admitting: *Deleted

## 2013-07-30 DIAGNOSIS — I4891 Unspecified atrial fibrillation: Secondary | ICD-10-CM

## 2013-07-30 LAB — POCT INR: INR: 3.6

## 2013-08-04 ENCOUNTER — Ambulatory Visit: Payer: Medicare Other | Admitting: Physical Therapy

## 2013-08-06 ENCOUNTER — Ambulatory Visit: Payer: Medicare Other | Admitting: Physical Therapy

## 2013-08-12 ENCOUNTER — Ambulatory Visit (INDEPENDENT_AMBULATORY_CARE_PROVIDER_SITE_OTHER): Payer: Medicare Other | Admitting: *Deleted

## 2013-08-12 DIAGNOSIS — I4891 Unspecified atrial fibrillation: Secondary | ICD-10-CM

## 2013-08-13 ENCOUNTER — Ambulatory Visit (INDEPENDENT_AMBULATORY_CARE_PROVIDER_SITE_OTHER): Payer: Medicare Other | Admitting: Otolaryngology

## 2013-08-13 ENCOUNTER — Other Ambulatory Visit: Payer: Self-pay

## 2013-08-13 DIAGNOSIS — J310 Chronic rhinitis: Secondary | ICD-10-CM

## 2013-08-13 DIAGNOSIS — H9209 Otalgia, unspecified ear: Secondary | ICD-10-CM

## 2013-08-13 DIAGNOSIS — H903 Sensorineural hearing loss, bilateral: Secondary | ICD-10-CM

## 2013-08-13 DIAGNOSIS — R42 Dizziness and giddiness: Secondary | ICD-10-CM

## 2013-08-28 ENCOUNTER — Other Ambulatory Visit: Payer: Self-pay | Admitting: Cardiology

## 2013-09-02 ENCOUNTER — Other Ambulatory Visit: Payer: Self-pay | Admitting: Cardiology

## 2013-09-02 ENCOUNTER — Ambulatory Visit (INDEPENDENT_AMBULATORY_CARE_PROVIDER_SITE_OTHER): Payer: Medicare Other | Admitting: *Deleted

## 2013-09-02 DIAGNOSIS — I4891 Unspecified atrial fibrillation: Secondary | ICD-10-CM

## 2013-09-18 ENCOUNTER — Encounter: Payer: Self-pay | Admitting: Emergency Medicine

## 2013-09-18 ENCOUNTER — Telehealth: Payer: Self-pay | Admitting: Emergency Medicine

## 2013-09-18 ENCOUNTER — Ambulatory Visit (INDEPENDENT_AMBULATORY_CARE_PROVIDER_SITE_OTHER): Payer: Medicare Other | Admitting: Emergency Medicine

## 2013-09-18 VITALS — BP 112/56 | HR 64 | Temp 97.5°F | Ht 65.0 in | Wt 183.0 lb

## 2013-09-18 DIAGNOSIS — J449 Chronic obstructive pulmonary disease, unspecified: Secondary | ICD-10-CM

## 2013-09-18 DIAGNOSIS — J309 Allergic rhinitis, unspecified: Secondary | ICD-10-CM

## 2013-09-18 DIAGNOSIS — J302 Other seasonal allergic rhinitis: Secondary | ICD-10-CM

## 2013-09-18 MED ORDER — AZELASTINE HCL 0.15 % NA SOLN: 2.0000 | Freq: Two times a day (BID) | NASAL | Status: DC

## 2013-09-18 NOTE — Patient Instructions (Signed)
Please continue your ipratropium nebs 4x a day Restart astelin 2 sprays each side 3 times a day.  Fluticasone nasal spray, 2 sprays each side twice a day Restart zyrtec once a day.  Follow with Dr Lamonte Sakai in 3 months or sooner if you have any problems.

## 2013-09-18 NOTE — Progress Notes (Signed)
Subjective:    Patient ID: [name], female    DOB: [birth year], 79 y.o.   MRN: [id number] HPI 79 yo former smoker (20 pk-yrs), hx of lymphocytic colitis/IBS on prednisone, hypothyroidism, psoriatic arthritis formerly on MTX, OA s/p knee replacement. Presents for evaluation of slowly progressive dyspnea. Bothers her with walking, climbing stairs. Also mentions frequent bouts of bronchitis with apparent associated bronchospasm. Had an acute bronchitis in 2/11, treated with pred + abx + SABA (xopenex due to tachycardia with albuterol). Then started on Spiriva. Now better but still SOB with exertion.   ROV 01/31/10 -- returns for f/u and after PFTs. Feels that she has benefitted from the Spiriva, her exertional symptoms and nighttime symptoms are much improved. Has had PFTs as below.   ROV 08/09/10 -- f/u for COPD (moderate AFL on spiro w no BD response). She was on Spiriva and felt that it helped her breathing, but it bothered her UA - made it dry, hoarse, throat burning. No allergy symptoms. She has been taking the xopenex. C/o some soreness on tongue, ? thrush.   ROV 10/17/10 -- moderate COPD, had been off pred for IBS/colitis since our last visit, just restarted by Dr Henrene Pastor. She doesn't think her breathing worsened off the pred. Tried Symbicort but thout the Spiriva helped her breathing more. She didn't have any side effects from the Symbicort. She is interested in retrying the Spiriva to see if she can tolerate. No flares or exacerbations since last visit. Rare xopenex use.  ROV 02/15/11 -- returns for COPD. We have tried both Symbicort and Spiriva. Last time we went back on Spiriva  - she believes that it has helped, but she is having some hoarse voice. Believes that it is due to Spiriva + a worsening of her allergies. Started on zyrtec in March with some relief but still bothering her. Since last visit Prednisone has been restarted. Had emergency sgy for cholecystitis in February. Having GERD sx  at night when she lays down.   ROV 06/19/11 -- follow up for COPD. She is tolerating the spiriva. Her prednisone was weaned to off in June, hasn't been restarted, she has f/u w Dr Henrene Pastor today. She rarely uses xopenex. No flares since last visit. She does still have some exertional sob.   ROV 12/18/11 -- COPD, also w hx of IBS/colitis followed by Dr Henrene Pastor. Currently on Spiriva, has xopenex to use prn - uses rarely. Still gets SOB with walking or with stairs. She thinks Spiriva helps but it also can make her throat hoarse. Not on anti-inflammatory meds at this time.   ROV 06/17/12 -- COPD, also w hx of IBS/colitis followed by Dr Henrene Pastor. She is using Spiriva. She has had more trouble with allergies and associated cough over the last several months. Not currently on zyrtec. She did benefit from chlorpheniramine. Rare SABA use.   07/28/2012 Acute OV  Complains of dry cough, chest congestion, increased SOB, wheezing, tightness in chest, head congestion w/ clear drainage, PND x4 days .  Has upcoming surgery next week - redo of knee replacement w/ ortho No hemoptysis or chest pain. No edema.  No recent abx use.  >>Omnicef x 7 d   Acute OV 08/04/12 --  Patient returns for persistent cough and congestion. The patient was seen 1 week ago for bronchitis and treated with 7 days of Omnicef. She has upcoming surgery on October 30 for a knee replacement She states that she is feeling much better but still has some  sinus drainage, congestion, and cough. She denies any discolored mucus or fever or chest pain, shortness of breath, or edema CXR last week w/ no acute process    ROV 12/25/12 -- COPD, also w hx of IBS/colitis followed by Dr Henrene Pastor. She returns for regular f/u. Treated in Oct 13 for AE as above. She is starting gto have allergy sx for last 2 weeks, bad at night. She is back on her zyrtec. She believes that her breathing is a bit more limiting than a year ago, SOB w exertion and with lifting objects. She  has tried symbicort before.    PULMONARY FUNCTON TEST 01/18/2010  FVC 2.54  FEV1 1.19  FEV1/FVC 46.9  FVC  % Predicted 97  FEV % Predicted 65  FeF 25-75 .37  FeF 25-75 % Predicted 2.11      ROV 02/05/13 -- COPD, also w hx of IBS/colitis, allergic rhinitis w cough. Last time we trialed Breo qd in addition to Spiriva. She isn't sure that the Memory Dance has been beneficial. Her breathing is similar. She has had congestion and allergic sx for the last few days. She restarted zyrtec 5-6 days ago. She has chlor-trimeton but hasn't used yet.   ROV 02/20/13 -- COPD, also w hx of IBS/colitis, allergic rhinitis w cough. Last time added fluticasone to zyrtec, asked her to try chlorpheniramine. Stopped Breo (no effect), continued Spiriva. She is having chest tightness "heaviness" that can go to her back. Some exertional dyspnea. The tightness in her chest has been of/on for a year. Her chest congestion is better, clear nasal drainage.   ROV 03/20/13 -- COPD, also w hx of IBS/colitis, allergic rhinitis w cough. She was having chest discomfort and cough last time - added omeprazole, also added astelin to fluticasone and zyrtec. She is much better. She took the astelin, took omeprazole.   ROV 09/18/13 -- COPD, also w hx of IBS/colitis, allergic rhinitis w cough. She was treated a couple months ago with pred + abx for a possible AE. She is off spiriva, is using ipratropium 3-4x a day. Currently experiencing cough and clear mucous. She is not on fluticasone, astelin or zyrtec. Not on omeprazole   Objective:   Physical Exam Filed Vitals:   09/18/13 0922  BP: 112/56  Pulse: 64  Temp: 97.5 F (36.4 C)  TempSrc: Oral  Height: 5' 5"  (1.651 m)  Weight: 183 lb (83.008 kg)  SpO2: 99%   Gen: Pleasant, overwt, in no distress,  normal affect  ENT: No lesions,  mouth clear,  oropharynx clear, no postnasal drip  Neck: No JVD, no TMG, no carotid bruits  Lungs: No use of accessory muscles, no dullness to percussion,  CTA   Cardiovascular: RRR, heart sounds normal, no murmur or gallops, no peripheral edema  Musculoskeletal: No deformities, no cyanosis or clubbing  Neuro: alert, non focal  Skin: Warm, no lesions or rashes   Assessment & Plan:  COPD No wheeze today, but less well controlled due to her allergies and PND - continue ipratropium nebs - address her allergies now - if not improving then will also restart omeprazole  Allergic rhinitis, seasonal - restart her zyrtec, fluticasone and astelin

## 2013-09-18 NOTE — Addendum Note (Signed)
Addended by: Gwynneth Aliment A on: 09/18/2013 09:51 AM   Modules accepted: Orders

## 2013-09-18 NOTE — Assessment & Plan Note (Signed)
-   restart her zyrtec, fluticasone and astelin

## 2013-09-18 NOTE — Telephone Encounter (Signed)
Pt request refill on astelin.  This o.k per RB.  Sent to pharmacy. Nothing further needed

## 2013-09-18 NOTE — Assessment & Plan Note (Signed)
No wheeze today, but less well controlled due to her allergies and PND - continue ipratropium nebs - address her allergies now - if not improving then will also restart omeprazole

## 2013-10-05 ENCOUNTER — Ambulatory Visit (INDEPENDENT_AMBULATORY_CARE_PROVIDER_SITE_OTHER): Payer: Medicare Other | Admitting: *Deleted

## 2013-10-05 DIAGNOSIS — I4891 Unspecified atrial fibrillation: Secondary | ICD-10-CM

## 2013-11-09 ENCOUNTER — Telehealth: Payer: Self-pay | Admitting: *Deleted

## 2013-11-09 ENCOUNTER — Ambulatory Visit (INDEPENDENT_AMBULATORY_CARE_PROVIDER_SITE_OTHER): Payer: Medicare Other | Admitting: *Deleted

## 2013-11-09 DIAGNOSIS — Z5181 Encounter for therapeutic drug level monitoring: Secondary | ICD-10-CM

## 2013-11-09 DIAGNOSIS — I4891 Unspecified atrial fibrillation: Secondary | ICD-10-CM

## 2013-11-09 LAB — POCT INR: INR: 2

## 2013-11-09 MED ORDER — DRONEDARONE HCL 400 MG PO TABS: 400.0000 mg | ORAL_TABLET | Freq: Two times a day (BID) | ORAL | Status: DC

## 2013-11-09 MED ORDER — WARFARIN SODIUM 5 MG PO TABS: ORAL_TABLET | ORAL | Status: DC

## 2013-11-09 NOTE — Telephone Encounter (Signed)
Confirmed with patient she is off xarelto and back on warfarin.

## 2013-11-25 ENCOUNTER — Other Ambulatory Visit: Payer: Medicare Other

## 2013-11-25 ENCOUNTER — Other Ambulatory Visit: Payer: Self-pay

## 2013-11-25 ENCOUNTER — Ambulatory Visit (INDEPENDENT_AMBULATORY_CARE_PROVIDER_SITE_OTHER): Payer: Medicare Other | Admitting: Cardiology

## 2013-11-25 ENCOUNTER — Encounter: Payer: Self-pay | Admitting: Cardiology

## 2013-11-25 ENCOUNTER — Ambulatory Visit: Payer: Medicare Other | Admitting: Cardiology

## 2013-11-25 ENCOUNTER — Encounter: Payer: Self-pay | Admitting: *Deleted

## 2013-11-25 VITALS — BP 120/60 | HR 83 | Ht 65.0 in | Wt 187.0 lb

## 2013-11-25 DIAGNOSIS — R079 Chest pain, unspecified: Secondary | ICD-10-CM

## 2013-11-25 DIAGNOSIS — I739 Peripheral vascular disease, unspecified: Secondary | ICD-10-CM

## 2013-11-25 DIAGNOSIS — I4891 Unspecified atrial fibrillation: Secondary | ICD-10-CM

## 2013-11-25 MED ORDER — WARFARIN SODIUM 5 MG PO TABS: ORAL_TABLET | ORAL | Status: DC

## 2013-11-25 NOTE — Progress Notes (Signed)
Patient ID: [name], female   DOB: [birth year], 79 y.o.   MRN: [id number] PCP: Dr. Willey Blade  80 yo with history of psoriatic arthritis, lymphocytic colitis, paroxysmal atrial fibrillation, and COPD returns for cardiology followup. She had a normal Lexiscan Myoview in 7/13 for atypical chest pain.   Since her last visit, she has not had tachypalpitations concerning for recurrent atrial fibrillation.  She is on dronedarone, but it has been very expensive for her.  No chest pain.  She is chronically short of breath after walking 100-200 feet from COPD.  She is in NSR today.  She does report that her toes have been turning blue at the tips when she takes off her shoes at night.  She denies pain in her calves or thighs with walking and she does not have foot ulcers.    Labs (12/13): TnI negative x 2, TSH normal, LDL 59 Labs (7/14): K 3.5, creatinine 0.79 Labs (9/14): HCT 41.7  ECG: NSR, normal  PMH: 1. Lymphocytic colitis 2. Hypothyroidism 3. Psoriatic arthritis with h/o MTX use.  4. OA s/p TKR 5. COPD with h/o exacerbations.  Prior smoker.  6. GERD with hiatal hernia 7. CCY 8. Depression 9. Chest pain: Lexiscan myoview (12/13) with EF 75%, apical thinning but no ischemia or infarction.  10. Paroxysmal atrial fibrillation  SH: Former smoker, quit in 1982, lives alone.   FH: Father with PCM, brother with MI.   ROS: All systems reviewed and negative except as per HPI.    Current Outpatient Prescriptions  Medication Sig Dispense Refill  . acetaminophen (TYLENOL) 325 MG tablet Take 2 tablets (650 mg total) by mouth every 6 (six) hours as needed (or Fever >/= 101).  60 tablet  0  . ALPRAZolam (XANAX) 0.5 MG tablet Take 0.25-0.5 mg by mouth at bedtime as needed. Sleep      . Azelastine HCl 0.15 % SOLN Place 2 sprays into the nose 2 (two) times daily.  30 mL  3  . betamethasone dipropionate (DIPROLENE) 0.05 % ointment Apply 1 application topically daily as needed.       . cetirizine  (ZYRTEC) 10 MG tablet Take 10 mg by mouth daily.      Marland Kitchen diltiazem (CARDIZEM CD) 120 MG 24 hr capsule Take 1 capsule (120 mg total) by mouth daily.  90 capsule  3  . diphenoxylate-atropine (LOMOTIL) 2.5-0.025 MG per tablet Take 1-2 tablets by mouth 3 (three) times daily as needed. diarrhea      . dronedarone (MULTAQ) 400 MG tablet Take 1 tablet (400 mg total) by mouth 2 (two) times daily with a meal.  60 tablet  6  . fluticasone (FLONASE) 50 MCG/ACT nasal spray       . ipratropium (ATROVENT) 0.02 % nebulizer solution Take 2.5 mLs (500 mcg total) by nebulization 4 (four) times daily. DX 496  300 mL  12  . levalbuterol (XOPENEX HFA) 45 MCG/ACT inhaler Inhale 1-2 puffs into the lungs every 4 (four) hours as needed for wheezing or shortness of breath. Wheezing  1 Inhaler  11  . levothyroxine (SYNTHROID, LEVOTHROID) 125 MCG tablet Take 125 mcg by mouth every morning.      . methylcellulose (ARTIFICIAL TEARS) 1 % ophthalmic solution Place 1 drop into both eyes 2 (two) times daily as needed. Dry eyes      . nitrofurantoin, macrocrystal-monohydrate, (MACROBID) 100 MG capsule       . omeprazole (PRILOSEC) 20 MG capsule Take 1 capsule (20 mg total) by mouth  daily.  30 capsule  11  . warfarin (COUMADIN) 5 MG tablet Take as directed by coumadin clinic  120 tablet  1  . [DISCONTINUED] Calcium Carbonate (CALCIUM 600) 1500 MG TABS Take 2 tablets by mouth daily.         No current facility-administered medications for this visit.    BP 120/60  Pulse 83  Ht 5' 5"  (1.651 m)  Wt 84.823 kg (187 lb)  BMI 31.12 kg/m2 General: NAD Neck: No JVD, no thyromegaly or thyroid nodule.  Lungs: Prolonged respiratory phase CV: Nondisplaced PMI.  Heart regular S1/S2, no S3/S4, no murmur.  No peripheral edema.  No carotid bruit.  Unable to palpate pedal pulses.  Abdomen: Soft, nontender, no hepatosplenomegaly, no distention.   Neurologic: Alert and oriented x 3.  Psych: Normal affect. Extremities: No clubbing or cyanosis.    Assessment/Plan: 1. Chest pain: Atypical with one prolonged episode at rest in 12/13.  No further chest pain.  Risk factors include family history (brother with MI) and prior smoking.  Lexiscan myoview was normal in 12/13.  no further workup needed at this time.  2. Atrial fibrillation: Paroxysmal atrial fibrillation, quite symptomatic (ER visits).  She is in NSR today on dronedarone, diltiazem CD, and Xarelto.  Since going on dronedarone, she has not had anymore symptomatic atrial fibrillation. - Dronedarone has been expensive for her and she wants to try stopping it.  I will let her try this but told her she ought to restart it if she has recurrent atrial fibrillation symptoms. An alternative would be to use flecainide which should be cheaper.   - Will check CBC and BMET today given Xarelto use. 3. Toe discoloration: She notes a blue coloration to her toes when she takes off her shoes.  No symptoms consistent with claudication.  I am unable to palpate her pedal pulses.  I think that she needs a peripheral vascular evaluation and will arrange for peripheral arterial doppler evaluation.   [name] 11/25/2013

## 2013-11-25 NOTE — Patient Instructions (Signed)
Your physician has requested that you have a lower  extremity arterial duplex. This test is an ultrasound of the arteries in the legs. It looks at arterial blood flow in the legs. Allow one hour for Lower Arterial scans. There are no restrictions or special instructions .  Your physician recommends that you have  lab work today--BMET/CBCd  Your physician wants you to follow-up in: 6 months with Dr Aundra Dubin. (August 2015) You will receive a reminder letter in the mail two months in advance. If you don't receive a letter, please call our office to schedule the follow-up appointment.

## 2013-11-26 LAB — BASIC METABOLIC PANEL
BUN: 15 mg/dL (ref 6–23)
CO2: 27 mEq/L (ref 19–32)
Calcium: 9.3 mg/dL (ref 8.4–10.5)
Chloride: 100 mEq/L (ref 96–112)
Creatinine, Ser: 0.9 mg/dL (ref 0.4–1.2)
GFR: 63.45 mL/min (ref >60.00)
Glucose, Bld: 90 mg/dL (ref 70–99)
POTASSIUM: 4.7 meq/L (ref 3.5–5.1)
SODIUM: 135 meq/L (ref 135–145)

## 2013-11-26 LAB — CBC WITH DIFFERENTIAL/PLATELET
Basophils Absolute: 0 10*3/uL (ref 0.0–0.1)
Basophils Relative: 0.2 % (ref 0.0–3.0)
EOS ABS: 0.3 10*3/uL (ref 0.0–0.7)
EOS PCT: 3.2 % (ref 0.0–5.0)
HCT: 44.6 % (ref 36.0–46.0)
Hemoglobin: 14.9 g/dL (ref 12.0–15.0)
LYMPHS PCT: 22.7 % (ref 12.0–46.0)
Lymphs Abs: 2.2 10*3/uL (ref 0.7–4.0)
MCHC: 33.4 g/dL (ref 30.0–36.0)
MCV: 91.1 fl (ref 78.0–100.0)
MONO ABS: 0.5 10*3/uL (ref 0.1–1.0)
Monocytes Relative: 5.2 % (ref 3.0–12.0)
NEUTROS PCT: 68.7 % (ref 43.0–77.0)
Neutro Abs: 6.7 10*3/uL (ref 1.4–7.7)
PLATELETS: 243 10*3/uL (ref 150.0–400.0)
RBC: 4.9 Mil/uL (ref 3.87–5.11)
RDW: 13.6 % (ref 11.5–14.6)
WBC: 9.8 10*3/uL (ref 4.5–10.5)

## 2013-12-03 ENCOUNTER — Encounter (HOSPITAL_COMMUNITY): Payer: Medicare Other

## 2013-12-07 ENCOUNTER — Ambulatory Visit (HOSPITAL_BASED_OUTPATIENT_CLINIC_OR_DEPARTMENT_OTHER): Payer: Medicare Other

## 2013-12-07 DIAGNOSIS — I739 Peripheral vascular disease, unspecified: Secondary | ICD-10-CM

## 2013-12-07 DIAGNOSIS — I4891 Unspecified atrial fibrillation: Secondary | ICD-10-CM

## 2013-12-08 ENCOUNTER — Inpatient Hospital Stay (HOSPITAL_COMMUNITY)
Admission: EM | Admit: 2013-12-08 | Discharge: 2013-12-14 | DRG: 481 | Disposition: A | Discharge status: Skilled Nursing Facility | Payer: Medicare Other | Attending: Internal Medicine | Admitting: Internal Medicine

## 2013-12-08 ENCOUNTER — Encounter (HOSPITAL_COMMUNITY): Payer: Self-pay | Admitting: Emergency Medicine

## 2013-12-08 ENCOUNTER — Emergency Department (HOSPITAL_COMMUNITY): Payer: Medicare Other

## 2013-12-08 DIAGNOSIS — R079 Chest pain, unspecified: Secondary | ICD-10-CM

## 2013-12-08 DIAGNOSIS — J449 Chronic obstructive pulmonary disease, unspecified: Secondary | ICD-10-CM

## 2013-12-08 DIAGNOSIS — K5289 Other specified noninfective gastroenteritis and colitis: Secondary | ICD-10-CM

## 2013-12-08 DIAGNOSIS — Z7901 Long term (current) use of anticoagulants: Secondary | ICD-10-CM

## 2013-12-08 DIAGNOSIS — R05 Cough: Secondary | ICD-10-CM

## 2013-12-08 DIAGNOSIS — S72009A Fracture of unspecified part of neck of unspecified femur, initial encounter for closed fracture: Secondary | ICD-10-CM

## 2013-12-08 DIAGNOSIS — J302 Other seasonal allergic rhinitis: Secondary | ICD-10-CM

## 2013-12-08 DIAGNOSIS — M129 Arthropathy, unspecified: Secondary | ICD-10-CM

## 2013-12-08 DIAGNOSIS — L4050 Arthropathic psoriasis, unspecified: Secondary | ICD-10-CM | POA: Diagnosis present

## 2013-12-08 DIAGNOSIS — R110 Nausea: Secondary | ICD-10-CM | POA: Diagnosis present

## 2013-12-08 DIAGNOSIS — I739 Peripheral vascular disease, unspecified: Secondary | ICD-10-CM

## 2013-12-08 DIAGNOSIS — R140 Abdominal distension (gaseous): Secondary | ICD-10-CM

## 2013-12-08 DIAGNOSIS — R109 Unspecified abdominal pain: Secondary | ICD-10-CM | POA: Diagnosis present

## 2013-12-08 DIAGNOSIS — Z860100 Personal history of colon polyps, unspecified: Secondary | ICD-10-CM

## 2013-12-08 DIAGNOSIS — R197 Diarrhea, unspecified: Secondary | ICD-10-CM

## 2013-12-08 DIAGNOSIS — M25569 Pain in unspecified knee: Secondary | ICD-10-CM

## 2013-12-08 DIAGNOSIS — R000 Tachycardia, unspecified: Secondary | ICD-10-CM

## 2013-12-08 DIAGNOSIS — M6281 Muscle weakness (generalized): Secondary | ICD-10-CM

## 2013-12-08 DIAGNOSIS — I4891 Unspecified atrial fibrillation: Secondary | ICD-10-CM

## 2013-12-08 DIAGNOSIS — K589 Irritable bowel syndrome without diarrhea: Secondary | ICD-10-CM | POA: Diagnosis present

## 2013-12-08 DIAGNOSIS — Z96659 Presence of unspecified artificial knee joint: Secondary | ICD-10-CM

## 2013-12-08 DIAGNOSIS — S72002A Fracture of unspecified part of neck of left femur, initial encounter for closed fracture: Secondary | ICD-10-CM

## 2013-12-08 DIAGNOSIS — Z800 Family history of malignant neoplasm of digestive organs: Secondary | ICD-10-CM

## 2013-12-08 DIAGNOSIS — K219 Gastro-esophageal reflux disease without esophagitis: Secondary | ICD-10-CM | POA: Diagnosis present

## 2013-12-08 DIAGNOSIS — M25669 Stiffness of unspecified knee, not elsewhere classified: Secondary | ICD-10-CM

## 2013-12-08 DIAGNOSIS — J4489 Other specified chronic obstructive pulmonary disease: Secondary | ICD-10-CM

## 2013-12-08 DIAGNOSIS — Z801 Family history of malignant neoplasm of trachea, bronchus and lung: Secondary | ICD-10-CM

## 2013-12-08 DIAGNOSIS — D62 Acute posthemorrhagic anemia: Secondary | ICD-10-CM

## 2013-12-08 DIAGNOSIS — Z5181 Encounter for therapeutic drug level monitoring: Secondary | ICD-10-CM

## 2013-12-08 DIAGNOSIS — R1010 Upper abdominal pain, unspecified: Secondary | ICD-10-CM

## 2013-12-08 DIAGNOSIS — R262 Difficulty in walking, not elsewhere classified: Secondary | ICD-10-CM

## 2013-12-08 DIAGNOSIS — T84028A Dislocation of other internal joint prosthesis, initial encounter: Secondary | ICD-10-CM

## 2013-12-08 DIAGNOSIS — E039 Hypothyroidism, unspecified: Secondary | ICD-10-CM

## 2013-12-08 DIAGNOSIS — Z87891 Personal history of nicotine dependence: Secondary | ICD-10-CM

## 2013-12-08 DIAGNOSIS — N390 Urinary tract infection, site not specified: Secondary | ICD-10-CM | POA: Diagnosis present

## 2013-12-08 DIAGNOSIS — Z8249 Family history of ischemic heart disease and other diseases of the circulatory system: Secondary | ICD-10-CM

## 2013-12-08 DIAGNOSIS — Z6829 Body mass index (BMI) 29.0-29.9, adult: Secondary | ICD-10-CM

## 2013-12-08 DIAGNOSIS — S72143A Displaced intertrochanteric fracture of unspecified femur, initial encounter for closed fracture: Principal | ICD-10-CM | POA: Diagnosis present

## 2013-12-08 DIAGNOSIS — Y92009 Unspecified place in unspecified non-institutional (private) residence as the place of occurrence of the external cause: Secondary | ICD-10-CM

## 2013-12-08 DIAGNOSIS — R1084 Generalized abdominal pain: Secondary | ICD-10-CM

## 2013-12-08 DIAGNOSIS — R059 Cough, unspecified: Secondary | ICD-10-CM

## 2013-12-08 DIAGNOSIS — W010XXA Fall on same level from slipping, tripping and stumbling without subsequent striking against object, initial encounter: Secondary | ICD-10-CM | POA: Diagnosis present

## 2013-12-08 LAB — CBC WITH DIFFERENTIAL/PLATELET
BASOS ABS: 0 10*3/uL (ref 0.0–0.1)
BASOS PCT: 0 % (ref 0–1)
EOS PCT: 0 % (ref 0–5)
Eosinophils Absolute: 0.1 10*3/uL (ref 0.0–0.7)
HCT: 43.1 % (ref 36.0–46.0)
Hemoglobin: 14.7 g/dL (ref 12.0–15.0)
Lymphocytes Relative: 5 % — ABNORMAL LOW (ref 12–46)
Lymphs Abs: 1 10*3/uL (ref 0.7–4.0)
MCH: 31.1 pg (ref 26.0–34.0)
MCHC: 34.1 g/dL (ref 30.0–36.0)
MCV: 91.3 fL (ref 78.0–100.0)
Monocytes Absolute: 0.8 10*3/uL (ref 0.1–1.0)
Monocytes Relative: 4 % (ref 3–12)
NEUTROS ABS: 18 10*3/uL — AB (ref 1.7–7.7)
Neutrophils Relative %: 90 % — ABNORMAL HIGH (ref 43–77)
Platelets: 203 10*3/uL (ref 150–400)
RBC: 4.72 MIL/uL (ref 3.87–5.11)
RDW: 13.2 % (ref 11.5–15.5)
WBC: 19.9 10*3/uL — ABNORMAL HIGH (ref 4.0–10.5)

## 2013-12-08 LAB — COMPREHENSIVE METABOLIC PANEL
ALBUMIN: 3.6 g/dL (ref 3.5–5.2)
ALT: 16 U/L (ref 0–35)
AST: 18 U/L (ref 0–37)
Alkaline Phosphatase: 59 U/L (ref 39–117)
BUN: 14 mg/dL (ref 6–23)
CALCIUM: 9.2 mg/dL (ref 8.4–10.5)
CHLORIDE: 103 meq/L (ref 96–112)
CO2: 29 mEq/L (ref 19–32)
CREATININE: 0.89 mg/dL (ref 0.50–1.10)
GFR calc Af Amer: 70 mL/min — ABNORMAL LOW (ref >90)
GFR calc non Af Amer: 60 mL/min — ABNORMAL LOW (ref >90)
Glucose, Bld: 117 mg/dL — ABNORMAL HIGH (ref 70–99)
Potassium: 4.5 mEq/L (ref 3.7–5.3)
Sodium: 140 mEq/L (ref 137–147)
Total Bilirubin: 0.4 mg/dL (ref 0.3–1.2)
Total Protein: 7.2 g/dL (ref 6.0–8.3)

## 2013-12-08 LAB — URINALYSIS, ROUTINE W REFLEX MICROSCOPIC
Bilirubin Urine: NEGATIVE
Glucose, UA: NEGATIVE mg/dL
Ketones, ur: NEGATIVE mg/dL
LEUKOCYTES UA: NEGATIVE
NITRITE: NEGATIVE
PH: 7 (ref 5.0–8.0)
Protein, ur: NEGATIVE mg/dL
Specific Gravity, Urine: 1.02 (ref 1.005–1.030)
Urobilinogen, UA: 0.2 mg/dL (ref 0.0–1.0)

## 2013-12-08 LAB — URINE MICROSCOPIC-ADD ON

## 2013-12-08 LAB — PROTIME-INR
INR: 2.12 — ABNORMAL HIGH (ref 0.00–1.49)
Prothrombin Time: 23.1 seconds — ABNORMAL HIGH (ref 11.6–15.2)

## 2013-12-08 MED ORDER — ONDANSETRON HCL 4 MG/2ML IJ SOLN
4.0000 mg | Freq: Four times a day (QID) | INTRAMUSCULAR | Status: DC | PRN
  Administered 2013-12-12 – 2013-12-13 (×3): 4 mg via INTRAVENOUS
  Filled 2013-12-08 (×3): qty 2

## 2013-12-08 MED ORDER — DEXTROSE 5 % IV SOLN
1.0000 g | INTRAVENOUS | Status: DC
  Administered 2013-12-08 – 2013-12-13 (×6): 1 g via INTRAVENOUS
  Filled 2013-12-08 (×7): qty 10

## 2013-12-08 MED ORDER — FLUTICASONE PROPIONATE 50 MCG/ACT NA SUSP
1.0000 | Freq: Every day | NASAL | Status: DC
  Administered 2013-12-08 – 2013-12-14 (×7): 1 via NASAL
  Filled 2013-12-08 (×2): qty 16

## 2013-12-08 MED ORDER — SODIUM CHLORIDE 0.9 % IJ SOLN
3.0000 mL | Freq: Two times a day (BID) | INTRAMUSCULAR | Status: DC
  Administered 2013-12-08 – 2013-12-14 (×8): 3 mL via INTRAVENOUS

## 2013-12-08 MED ORDER — LORATADINE 10 MG PO TABS: 10.0000 mg | ORAL_TABLET | Freq: Every day | ORAL | Status: DC

## 2013-12-08 MED ORDER — HYDROMORPHONE HCL PF 1 MG/ML IJ SOLN
1.0000 mg | Freq: Once | INTRAMUSCULAR | Status: AC
  Administered 2013-12-08: 1 mg via INTRAVENOUS
  Filled 2013-12-08: qty 1

## 2013-12-08 MED ORDER — DIPHENOXYLATE-ATROPINE 2.5-0.025 MG PO TABS
1.0000 | ORAL_TABLET | Freq: Three times a day (TID) | ORAL | Status: DC | PRN
  Administered 2013-12-08 – 2013-12-10 (×5): 1 via ORAL
  Filled 2013-12-08 (×5): qty 1

## 2013-12-08 MED ORDER — ACETAMINOPHEN 325 MG PO TABS: 650.0000 mg | ORAL_TABLET | Freq: Four times a day (QID) | ORAL | Status: DC | PRN

## 2013-12-08 MED ORDER — HYDROMORPHONE HCL PF 1 MG/ML IJ SOLN
1.0000 mg | INTRAMUSCULAR | Status: AC | PRN
  Administered 2013-12-08 – 2013-12-09 (×3): 1 mg via INTRAVENOUS
  Filled 2013-12-08 (×3): qty 1

## 2013-12-08 MED ORDER — LEVOTHYROXINE SODIUM 125 MCG PO TABS
125.0000 ug | ORAL_TABLET | Freq: Every day | ORAL | Status: DC
  Administered 2013-12-09 – 2013-12-14 (×6): 125 ug via ORAL
  Filled 2013-12-08 (×7): qty 1

## 2013-12-08 MED ORDER — DRONEDARONE HCL 400 MG PO TABS: 400.0000 mg | ORAL_TABLET | Freq: Two times a day (BID) | ORAL | Status: DC

## 2013-12-08 MED ORDER — METHYLCELLULOSE 1 % OP SOLN: 1.0000 [drp] | Freq: Two times a day (BID) | OPHTHALMIC | Status: DC | PRN

## 2013-12-08 MED ORDER — POLYVINYL ALCOHOL 1.4 % OP SOLN
1.0000 [drp] | Freq: Two times a day (BID) | OPHTHALMIC | Status: DC | PRN
  Filled 2013-12-08: qty 15

## 2013-12-08 MED ORDER — IPRATROPIUM BROMIDE 0.02 % IN SOLN
500.0000 ug | Freq: Four times a day (QID) | RESPIRATORY_TRACT | Status: DC
  Administered 2013-12-08 – 2013-12-14 (×23): 500 ug via RESPIRATORY_TRACT
  Filled 2013-12-08 (×22): qty 2.5

## 2013-12-08 MED ORDER — ONDANSETRON HCL 4 MG/2ML IJ SOLN
4.0000 mg | Freq: Once | INTRAMUSCULAR | Status: AC
  Administered 2013-12-08: 4 mg via INTRAVENOUS
  Filled 2013-12-08: qty 2

## 2013-12-08 MED ORDER — PANTOPRAZOLE SODIUM 40 MG PO TBEC: 40.0000 mg | DELAYED_RELEASE_TABLET | Freq: Every day | ORAL | Status: DC

## 2013-12-08 MED ORDER — ONDANSETRON HCL 4 MG PO TABS: 4.0000 mg | ORAL_TABLET | Freq: Four times a day (QID) | ORAL | Status: DC | PRN

## 2013-12-08 MED ORDER — ALBUTEROL SULFATE (2.5 MG/3ML) 0.083% IN NEBU: 3.0000 mL | INHALATION_SOLUTION | RESPIRATORY_TRACT | Status: DC | PRN

## 2013-12-08 MED ORDER — ALPRAZOLAM 0.25 MG PO TABS
0.2500 mg | ORAL_TABLET | Freq: Every evening | ORAL | Status: DC | PRN
  Administered 2013-12-13: 0.25 mg via ORAL
  Filled 2013-12-08: qty 1
  Filled 2013-12-08: qty 2

## 2013-12-08 MED ORDER — DILTIAZEM HCL ER COATED BEADS 120 MG PO CP24
120.0000 mg | ORAL_CAPSULE | Freq: Every day | ORAL | Status: DC
  Administered 2013-12-08 – 2013-12-14 (×7): 120 mg via ORAL
  Filled 2013-12-08 (×7): qty 1

## 2013-12-08 MED ORDER — ACETAMINOPHEN 650 MG RE SUPP: 650.0000 mg | Freq: Four times a day (QID) | RECTAL | Status: DC | PRN

## 2013-12-08 MED ORDER — ONDANSETRON HCL 4 MG/2ML IJ SOLN: 4.0000 mg | Freq: Three times a day (TID) | INTRAMUSCULAR | Status: DC | PRN

## 2013-12-08 NOTE — ED Notes (Signed)
Attempted to call report. RN at [facility] in report and unavailable.

## 2013-12-08 NOTE — ED Provider Notes (Signed)
CSN: [id number]     Arrival date & time 12/08/13  1615 History  This chart was scribed for Laura Muskrat, MD by Zettie Pho, ED Scribe. This patient was seen in room APA07/APA07 and the patient's care was started at 4:37 PM.    Chief Complaint  Patient presents with  . Fall   The history is provided by the patient and the EMS personnel. No language interpreter was used.   HPI Comments: KINDAL PONTI is a 80 y.o. female brought in by EMS with a history of psoriatic arthritis who presents to the Emergency Department complaining of a fall that occurred about 2 hours ago when she reports that she tripped on a rug, causing her to land on her left side. Patient is complaining of a constant, sudden-onset pain to the left hip secondary to the incident. She states that she has not been ambulatory secondary to pain. Patient reports some associated nausea. EMS placed an IV and administered 10 mg of morphine PTA. She denies hitting her head, shoulder pain, neck pain, confusion, disorientation, emesis. Patient takes 5 mg coumadin daily. Patient also has a history of hypothyroidism, diverticulosis, GERD, uterine polyp, lymphocytic colitis, and COPD.   Past Medical History  Diagnosis Date  . Hypothyroidism   . Psoriatic arthritis   . IBS (irritable bowel syndrome)   . Hemorrhoids   . Diverticulosis   . Rectal polyp   . GERD (gastroesophageal reflux disease)   . Family hx of colon cancer   . Uterine polyp   . Arthritis   . COPD (chronic obstructive pulmonary disease)   . Lymphocytic colitis    Past Surgical History  Procedure Laterality Date  . Knee surgery      lt.  Marland Kitchen Uterine polypectomy    . Cholecystectomy  Dec 04, 2010  . Total knee revision  08/06/2012    Procedure: TOTAL KNEE REVISION;  Surgeon: [name], MD;  Location: [facility];  Service: Orthopedics;  Laterality: Left;  Left Total Knee Arthroplasty Revision   Family History  Problem Relation Age of Onset  . Heart disease Father      pacemaker  . Lung cancer Father   . Colon cancer Mother    History  Substance Use Topics  . Smoking status: Former Smoker -- 1.00 packs/day for 20 years    Types: Cigarettes    Quit date: 10/08/1980  . Smokeless tobacco: Never Used  . Alcohol Use: Yes     Comment: rarely   OB History   Grav Para Term Preterm Abortions TAB SAB Ect Mult Living                 Review of Systems  Constitutional:       Per HPI, otherwise negative  HENT:       Per HPI, otherwise negative  Respiratory:       Per HPI, otherwise negative  Cardiovascular:       Per HPI, otherwise negative  Gastrointestinal: Positive for nausea. Negative for vomiting.  Endocrine:       Negative aside from HPI  Genitourinary:       Neg aside from HPI   Musculoskeletal: Positive for arthralgias. Negative for neck pain.       Per HPI, otherwise negative  Skin: Negative.   Neurological: Negative for syncope.  Psychiatric/Behavioral: Negative for confusion.      Allergies  Codeine  Home Medications   Current Outpatient Rx  Name  Route  Sig  Dispense  Refill  . acetaminophen (TYLENOL) 325 MG tablet   Oral   Take 2 tablets (650 mg total) by mouth every 6 (six) hours as needed (or Fever >/= 101).   60 tablet   0   . ALPRAZolam (XANAX) 0.5 MG tablet   Oral   Take 0.25-0.5 mg by mouth at bedtime as needed. Sleep         . Azelastine HCl 0.15 % SOLN   Nasal   Place 2 sprays into the nose 2 (two) times daily.   30 mL   3   . betamethasone dipropionate (DIPROLENE) 0.05 % ointment   Topical   Apply 1 application topically daily as needed.          . cetirizine (ZYRTEC) 10 MG tablet   Oral   Take 10 mg by mouth daily.         Marland Kitchen diltiazem (CARDIZEM CD) 120 MG 24 hr capsule   Oral   Take 1 capsule (120 mg total) by mouth daily.   90 capsule   3   . diphenoxylate-atropine (LOMOTIL) 2.5-0.025 MG per tablet   Oral   Take 1-2 tablets by mouth 3 (three) times daily as needed. diarrhea          . dronedarone (MULTAQ) 400 MG tablet   Oral   Take 1 tablet (400 mg total) by mouth 2 (two) times daily with a meal.   60 tablet   6   . fluticasone (FLONASE) 50 MCG/ACT nasal spray               . ipratropium (ATROVENT) 0.02 % nebulizer solution   Nebulization   Take 2.5 mLs (500 mcg total) by nebulization 4 (four) times daily. DX 496   300 mL   12   . levalbuterol (XOPENEX HFA) 45 MCG/ACT inhaler   Inhalation   Inhale 1-2 puffs into the lungs every 4 (four) hours as needed for wheezing or shortness of breath. Wheezing   1 Inhaler   11   . levothyroxine (SYNTHROID, LEVOTHROID) 125 MCG tablet   Oral   Take 125 mcg by mouth every morning.         . methylcellulose (ARTIFICIAL TEARS) 1 % ophthalmic solution   Both Eyes   Place 1 drop into both eyes 2 (two) times daily as needed. Dry eyes         . nitrofurantoin, macrocrystal-monohydrate, (MACROBID) 100 MG capsule               . omeprazole (PRILOSEC) 20 MG capsule   Oral   Take 1 capsule (20 mg total) by mouth daily.   30 capsule   11   . warfarin (COUMADIN) 5 MG tablet      Take as directed by coumadin clinic   120 tablet   1     90 day supply    Triage Vitals: BP 126/60  Pulse 79  Temp(Src) 97.6 F (36.4 C) (Oral)  Resp 18  Ht 5' 5"  (1.651 m)  Wt 178 lb (80.74 kg)  BMI 29.62 kg/m2  SpO2 97%  Physical Exam  Nursing note and vitals reviewed. Constitutional: She is oriented to person, place, and time. She appears well-developed and well-nourished. No distress.  HENT:  Head: Normocephalic and atraumatic.  Eyes: Conjunctivae and EOM are normal.  Cardiovascular: Normal rate and regular rhythm.   Pulmonary/Chest: Effort normal and breath sounds normal. No stridor. No respiratory distress.  Abdominal: She exhibits no distension.  Musculoskeletal: She  exhibits no edema.  Unable to move left hip or knee.   Neurological: She is alert and oriented to person, place, and time. No cranial nerve  deficit.  Distally neurovascularly intact.   Skin: Skin is warm and dry.  Psychiatric: She has a normal mood and affect.    ED Course  BLADDER CATHETERIZATION Date/Time: 12/08/2013 6:41 PM Performed by: [name] Authorized by: [name] Consent: Verbal consent obtained. The procedure was performed in an emergent situation. Risks and benefits: risks, benefits and alternatives were discussed Consent given by: patient Required items: required blood products, implants, devices, and special equipment available Patient identity confirmed: verbally with patient Time out: Immediately prior to procedure a "time out" was called to verify the correct patient, procedure, equipment, support staff and site/side marked as required. Indications: inability to ambulate Local anesthesia used: no Patient sedated: no Preparation: Patient was prepped and draped in the usual sterile fashion. Catheter insertion: temporary indwelling Catheter size: 14 Fr Patient tolerance: Patient tolerated the procedure well with no immediate complications.   (including critical care time)  DIAGNOSTIC STUDIES: Oxygen Saturation is 97% on room air, normal by my interpretation.    COORDINATION OF CARE: 4:41 PM- Ordered an x-ray of the left hip. Will order pain and anti-nausea medication to manage symptoms. Discussed treatment plan with patient at bedside and patient verbalized agreement.     Labs Review Labs Reviewed  CBC WITH DIFFERENTIAL - Abnormal; Notable for the following:    WBC 19.9 (*)    Neutrophils Relative % 90 (*)    Neutro Abs 18.0 (*)    Lymphocytes Relative 5 (*)    All other components within normal limits  COMPREHENSIVE METABOLIC PANEL - Abnormal; Notable for the following:    Glucose, Bld 117 (*)    GFR calc non Af Amer 60 (*)    GFR calc Af Amer 70 (*)    All other components within normal limits  PROTIME-INR - Abnormal; Notable for the following:    Prothrombin Time 23.1 (*)     INR 2.12 (*)    All other components within normal limits  URINALYSIS, ROUTINE W REFLEX MICROSCOPIC   Imaging Review Dg Chest 1 View  12/08/2013   CLINICAL DATA:  Left hip and leg pain.  Fall.  EXAM: CHEST - 1 VIEW  COMPARISON:  DG CHEST 1V PORT dated 04/10/2013; DG CHEST 2 VIEW dated 07/31/2012; DG HIP COMPLETE*L* dated 12/08/2013  FINDINGS: Atherosclerotic aortic arch. Heart size within normal limits. The lungs appear grossly clear. Bony demineralization noted.  IMPRESSION: 1. No acute thoracic findings. 2. Atherosclerotic aortic arch. 3. Bony demineralization.   Electronically Signed   By: Sherryl Barters M.D.   On: 12/08/2013 18:01   Dg Hip Complete Left  12/08/2013   CLINICAL DATA:  Left hip pain after fall.  EXAM: LEFT HIP - COMPLETE 2+ VIEW  COMPARISON:  None.  FINDINGS: Acute left intertrochanteric fracture. Separate lesser trochanteric fragment.  Axial loss of articular space in both hips with mild protrusio. Degenerative spurring of both femoral heads.  IMPRESSION: 1. Acute left hip intertrochanteric fracture. Separate lesser trochanteric fragment. 2. Bilateral hip arthropathy with axial loss of articular space and mild protrusio.   Electronically Signed   By: Sherryl Barters M.D.   On: 12/08/2013 18:02   I reviewed the x-ray myself, agree with the interpretation.  Subsequently discussed patient's case with her orthopedic doctor.  Patient will be transferred to [facility] for definitive management by the orthopedic  team.  Subsequently discussed patient's case with our hospitalist team.  They will facilitate transfer and admission. MDM   I personally performed the services described in this documentation, which was scribed in my presence. The recorded information has been reviewed and is accurate.   This elderly female presents after a mechanical fall with pain in the left hip.  Patient is unable to relate, and on exam has tenderness to palpation with deformity of the left hip.   Patient has x-ray evidence of a left intratrochanteric fracture.  The patient required transfer to another facility for definitive management of the fracture itself.   Laura Muskrat, MD 12/08/13 2110

## 2013-12-08 NOTE — Progress Notes (Signed)
ANTIBIOTIC CONSULT NOTE - INITIAL  Pharmacy Consult for Rocephin Indication: UTI  Allergies  Allergen Reactions  . Codeine Itching    Patient Measurements: Height: 5\' 5"  (165.1 cm) Weight: 178 lb (80.74 kg) IBW/kg (Calculated) : 57  Vital Signs: Temp: 98.8 F (37.1 C) (03/03 2010) Temp src: Oral (03/03 2010) BP: 120/67 mmHg (03/03 2010) Pulse Rate: 96 (03/03 2010) Intake/Output from previous day:   Intake/Output from this shift:    Labs:  Recent Labs  12/08/13 1707  WBC 19.9*  HGB 14.7  PLT 203  CREATININE 0.89   Estimated Creatinine Clearance: 54.7 ml/min (by C-G formula based on Cr of 0.89). No results found for this basename: VANCOTROUGH, VANCOPEAK, VANCORANDOM, GENTTROUGH, GENTPEAK, GENTRANDOM, TOBRATROUGH, TOBRAPEAK, TOBRARND, AMIKACINPEAK, AMIKACINTROU, AMIKACIN,  in the last 72 hours   Microbiology: No results found for this or any previous visit (from the past 720 hour(s)).  Medical History: Past Medical History  Diagnosis Date  . Hypothyroidism   . Psoriatic arthritis   . IBS (irritable bowel syndrome)   . Hemorrhoids   . Diverticulosis   . Rectal polyp   . GERD (gastroesophageal reflux disease)   . Family hx of colon cancer   . Uterine polyp   . Arthritis   . COPD (chronic obstructive pulmonary disease)   . Lymphocytic colitis     Medications:  Scheduled:  . cefTRIAXone (ROCEPHIN)  IV  1 g Intravenous Q24H  . diltiazem  120 mg Oral Daily  . [START ON 12/09/2013] dronedarone  400 mg Oral BID WC  . fluticasone  1 spray Each Nare Daily  . ipratropium  500 mcg Nebulization QID  . [START ON 12/09/2013] levothyroxine  125 mcg Oral q morning - 10a  . loratadine  10 mg Oral Daily  . pantoprazole  40 mg Oral Daily  . sodium chloride  3 mL Intravenous Q12H   Infusions:   Assessment:  80 yr female admitted s/p fall sustaining left hip fracture  Elevated WBC (19.9) with U/A with few bacteria  Rocephin per pharmacy ordered for UTI  Goal of  Therapy:  Eradication of suspected infection  Plan:  Rocephin 1gm IV q24h for UTI.  Pharmacy will sign off as no further doseage adjustment needed for this medication.  Thank you for the consult.  Everette Rank 12/08/2013,8:53 PM

## 2013-12-08 NOTE — ED Notes (Signed)
EMS reports pt tripped over a rug at home and fell.  C/O left hip pain.  EMS started IV and administered 10mg  morphine.  Denies any head, neck, or back pain.

## 2013-12-08 NOTE — H&P (Addendum)
[facility] History and Physical  [name] ZOX:[id number] DOB: 06-Feb-1933 DOA: 12/08/2013  Referring physician:  PCP: Asencion Noble, MD  Specialists:   Chief Complaint: fall, L hip fracture   HPI: [name] is a 80 y.o. female PMH COPD, A fib on AC, hypothyroidism, IBS, colitis presented with mechanical fall and L hip fracture; she tripped on a rug, causing her to land on her left side, denies LOC, no focal neuro weakness, no fever, chills, no chest pain, no SOb, no nausea, vomiting; reports chronic mild diarrhea no change  -ED d/w orthopedics who requested to TF to [facility]   Review of Systems: The patient denies anorexia, fever, weight loss,, vision loss, decreased hearing, hoarseness, chest pain, syncope, dyspnea on exertion, peripheral edema, balance deficits, hemoptysis, abdominal pain, melena, hematochezia, severe indigestion/heartburn, hematuria, incontinence, genital sores, muscle weakness, suspicious skin lesions, transient blindness, difficulty walking, depression, unusual weight change, abnormal bleeding, enlarged lymph nodes, angioedema, and breast masses.    Past Medical History  Diagnosis Date  . Hypothyroidism   . Psoriatic arthritis   . IBS (irritable bowel syndrome)   . Hemorrhoids   . Diverticulosis   . Rectal polyp   . GERD (gastroesophageal reflux disease)   . Family hx of colon cancer   . Uterine polyp   . Arthritis   . COPD (chronic obstructive pulmonary disease)   . Lymphocytic colitis    Past Surgical History  Procedure Laterality Date  . Knee surgery      lt.  Marland Kitchen Uterine polypectomy    . Cholecystectomy  Dec 04, 2010  . Total knee revision  08/06/2012    Procedure: TOTAL KNEE REVISION;  Surgeon: [name], MD;  Location: [facility];  Service: Orthopedics;  Laterality: Left;  Left Total Knee Arthroplasty Revision   Social History:  reports that she quit smoking about 33 years ago. Her smoking use included Cigarettes. She has a 20 pack-year  smoking history. She has never used smokeless tobacco. She reports that she drinks alcohol. She reports that she does not use illicit drugs. Home;  where does patient live--home, ALF, SNF? and with whom if at home? Yes;  Can patient participate in ADLs?  Allergies  Allergen Reactions  . Codeine Itching    Family History  Problem Relation Age of Onset  . Heart disease Father     pacemaker  . Lung cancer Father   . Colon cancer Mother     (be sure to complete)  Prior to Admission medications   Medication Sig Start Date End Date Taking? Authorizing Provider  acetaminophen (TYLENOL) 325 MG tablet Take 2 tablets (650 mg total) by mouth every 6 (six) hours as needed (or Fever >/= 101). 08/08/12   Alexzandrew Dara Lords, PA-C  ALPRAZolam Duanne Moron) 0.5 MG tablet Take 0.25-0.5 mg by mouth at bedtime as needed. Sleep 02/15/12   Historical Provider, MD  Azelastine HCl 0.15 % SOLN Place 2 sprays into the nose 2 (two) times daily. 09/18/13   Collene Gobble, MD  betamethasone dipropionate (DIPROLENE) 0.05 % ointment Apply 1 application topically daily as needed.     Historical Provider, MD  cetirizine (ZYRTEC) 10 MG tablet Take 10 mg by mouth daily.    Historical Provider, MD  diltiazem (CARDIZEM CD) 120 MG 24 hr capsule Take 1 capsule (120 mg total) by mouth daily. 06/01/13   Larey Dresser, MD  diphenoxylate-atropine (LOMOTIL) 2.5-0.025 MG per tablet Take 1-2 tablets by mouth 3 (three) times daily as needed. diarrhea  06/19/11   [name], MD  dronedarone (MULTAQ) 400 MG tablet Take 1 tablet (400 mg total) by mouth 2 (two) times daily with a meal. 11/09/13   Larey Dresser, MD  fluticasone [facility]) 50 MCG/ACT nasal spray  11/20/13   Historical Provider, MD  ipratropium (ATROVENT) 0.02 % nebulizer solution Take 2.5 mLs (500 mcg total) by nebulization 4 (four) times daily. DX 496 06/18/13   Collene Gobble, MD  levalbuterol [facility] HFA) 45 MCG/ACT inhaler Inhale 1-2 puffs into the lungs every 4 (four) hours  as needed for wheezing or shortness of breath. Wheezing 02/20/13   Collene Gobble, MD  levothyroxine (SYNTHROID, LEVOTHROID) 125 MCG tablet Take 125 mcg by mouth every morning.    Historical Provider, MD  methylcellulose (ARTIFICIAL TEARS) 1 % ophthalmic solution Place 1 drop into both eyes 2 (two) times daily as needed. Dry eyes    Historical Provider, MD  nitrofurantoin, macrocrystal-monohydrate, (MACROBID) 100 MG capsule  06/22/13   Historical Provider, MD  omeprazole (PRILOSEC) 20 MG capsule Take 1 capsule (20 mg total) by mouth daily. 03/20/13   Collene Gobble, MD  warfarin (COUMADIN) 5 MG tablet Take as directed by coumadin clinic 11/25/13   Larey Dresser, MD   Physical Exam: Filed Vitals:   12/08/13 1622  BP: 126/60  Pulse: 79  Temp: 97.6 F (36.4 C)  Resp: 18     General:  alert  Eyes: EOM-i, perrla   ENT: no oral ulcers   Neck: supple,   Cardiovascular: s1,s2 rrr  Respiratory: CTA BL  Abdomen: soft, nt, nd   Skin: no rash  Musculoskeletal: no LE edema  Psychiatric: no hallucinations   Neurologic: CN 2-12 intact, motor L LE ROM decreased due to pain   Labs on Admission:  Basic Metabolic Panel:  Recent Labs Lab 12/08/13 1707  NA 140  K 4.5  CL 103  CO2 29  GLUCOSE 117*  BUN 14  CREATININE 0.89  CALCIUM 9.2   Liver Function Tests:  Recent Labs Lab 12/08/13 1707  AST 18  ALT 16  ALKPHOS 59  BILITOT 0.4  PROT 7.2  ALBUMIN 3.6   No results found for this basename: LIPASE, AMYLASE,  in the last 168 hours No results found for this basename: AMMONIA,  in the last 168 hours CBC:  Recent Labs Lab 12/08/13 1707  WBC 19.9*  NEUTROABS 18.0*  HGB 14.7  HCT 43.1  MCV 91.3  PLT 203   Cardiac Enzymes: No results found for this basename: CKTOTAL, CKMB, CKMBINDEX, TROPONINI,  in the last 168 hours  BNP (last 3 results) No results found for this basename: PROBNP,  in the last 8760 hours CBG: No results found for this basename: GLUCAP,  in the  last 168 hours  Radiological Exams on Admission: Dg Chest 1 View  12/08/2013   CLINICAL DATA:  Left hip and leg pain.  Fall.  EXAM: CHEST - 1 VIEW  COMPARISON:  DG CHEST 1V PORT dated 04/10/2013; DG CHEST 2 VIEW dated 07/31/2012; DG HIP COMPLETE*L* dated 12/08/2013  FINDINGS: Atherosclerotic aortic arch. Heart size within normal limits. The lungs appear grossly clear. Bony demineralization noted.  IMPRESSION: 1. No acute thoracic findings. 2. Atherosclerotic aortic arch. 3. Bony demineralization.   Electronically Signed   By: Sherryl Barters M.D.   On: 12/08/2013 18:01   Dg Hip Complete Left  12/08/2013   CLINICAL DATA:  Left hip pain after fall.  EXAM: LEFT HIP - COMPLETE 2+ VIEW  COMPARISON:  None.  FINDINGS: Acute left intertrochanteric fracture. Separate lesser trochanteric fragment.  Axial loss of articular space in both hips with mild protrusio. Degenerative spurring of both femoral heads.  IMPRESSION: 1. Acute left hip intertrochanteric fracture. Separate lesser trochanteric fragment. 2. Bilateral hip arthropathy with axial loss of articular space and mild protrusio.   Electronically Signed   By: Sherryl Barters M.D.   On: 12/08/2013 18:02    EKG: Independently reviewed. Not done   Assessment/Plan Principal Problem:   Fracture of hip, left, closed Active Problems:   HYPOTHYROIDISM   COPD   COLITIS   ARTHRITIS   Atrial fibrillation   81 y.o. female PMH COPD, A fib on AC, hypothyroidism, IBS, colitis (chronic, no change in symptoms) presented with mechanical fall and L hip fracture  1. Fall mechanical with L hip fracture  -ED d/w orthopedics, who requested to TF to [facility] for Dr. Wynelle Link to evaluate  -management defer to orthopedics   2. Leukocytosis of unclear etiology at this time, ? Stress related; afebrile; CXR: no acute findings;  -obtain UA, monitor   3. COPD, stable; cont bronchodilators prn   4. A fib on AC/coumadin; HR controlled; echo (04/2013): LVEF 65% -cont Cardizem; hold  coumadin, resume per ortho    Addendum: UA ? UTI, +leukocytosis; started IV atx; f/u cultures   DVT prophylaxis: INR therapeutic on admission; SCD; reeval in AM   Ortho;  if consultant consulted, please document name and whether formally or informally consulted  Code Status: full (must indicate code status--if unknown or must be presumed, indicate so) Family Communication: d/w patient, her daughter, sister (indicate person spoken with, if applicable, with phone number if by telephone) Disposition Plan: home  (indicate anticipated LOS)  Time spent: [place] [place] [facility] Pager [phone]  If 7PM-7AM, please contact night-coverage [email or web address] Password [facility] 12/08/2013, 6:32 PM

## 2013-12-09 DIAGNOSIS — I739 Peripheral vascular disease, unspecified: Secondary | ICD-10-CM

## 2013-12-09 LAB — PROTIME-INR
INR: 2.17 — ABNORMAL HIGH (ref 0.00–1.49)
Prothrombin Time: 23.5 seconds — ABNORMAL HIGH (ref 11.6–15.2)

## 2013-12-09 LAB — CBC
HEMATOCRIT: 38.9 % (ref 36.0–46.0)
Hemoglobin: 12.8 g/dL (ref 12.0–15.0)
MCH: 30.1 pg (ref 26.0–34.0)
MCHC: 32.9 g/dL (ref 30.0–36.0)
MCV: 91.5 fL (ref 78.0–100.0)
Platelets: 166 10*3/uL (ref 150–400)
RBC: 4.25 MIL/uL (ref 3.87–5.11)
RDW: 13.4 % (ref 11.5–15.5)
WBC: 8.1 10*3/uL (ref 4.0–10.5)

## 2013-12-09 LAB — URINE CULTURE
COLONY COUNT: NO GROWTH
CULTURE: NO GROWTH

## 2013-12-09 MED ORDER — IPRATROPIUM BROMIDE 0.02 % IN SOLN: 0.5000 mg | Freq: Four times a day (QID) | RESPIRATORY_TRACT | Status: DC | PRN

## 2013-12-09 MED ORDER — MORPHINE SULFATE 2 MG/ML IJ SOLN
1.0000 mg | INTRAMUSCULAR | Status: DC | PRN
  Administered 2013-12-09: 1 mg via INTRAVENOUS
  Filled 2013-12-09: qty 1

## 2013-12-09 MED ORDER — OXYCODONE-ACETAMINOPHEN 5-325 MG PO TABS
1.0000 | ORAL_TABLET | ORAL | Status: DC | PRN
  Administered 2013-12-09: 1 via ORAL
  Administered 2013-12-10 – 2013-12-14 (×14): 2 via ORAL
  Filled 2013-12-09 (×8): qty 2
  Filled 2013-12-09: qty 1
  Filled 2013-12-09 (×6): qty 2

## 2013-12-09 MED ORDER — HYDROMORPHONE HCL PF 1 MG/ML IJ SOLN
1.0000 mg | INTRAMUSCULAR | Status: DC | PRN
  Administered 2013-12-09 – 2013-12-13 (×8): 1 mg via INTRAVENOUS
  Filled 2013-12-09 (×8): qty 1

## 2013-12-09 NOTE — Progress Notes (Signed)
Rt went to do neb tx. Pt had lab in her room getting blood. Rt asked pt if she would like her breathing tx. Pt stated she would like to eat breakfast she had not eating since last night. Rt told pt if she would like her neb before 12:00 rounds to let RN know and she would call me.Pt stated o.k. Pt in no distress at this time.  Marland Kitchen

## 2013-12-09 NOTE — Progress Notes (Signed)
[facility] PROGRESS NOTE  [name] XTK:[id number] DOB: 1933-01-09 DOA: 12/08/2013 PCP: Asencion Noble, MD  Brief narrative: 80 year old female with past medical history of COPD, atrial fibrillation on AC, hypothyroidism, left TKA, IBS who presented to [facility] ED 12/08/2013 status post mechanical fall and subsequent left hip fracture. In ED, vitals were stable. Blood work revealed WBC count of 19.9 and INR of 2.12. Her urinalysis was unremarkable and CXR did not reveal acute cardiopulmonary findings. She was found to have left intertrochanteric fracture with plan for surgery tomorrow 12/10/2013. She was additionally started on empiric rocephin for possible UTI.  Assessment and Plan:  Principal Problem:   Fracture of hip, left, closed - management per orhtopedic surgery, plan for surgery in am - continue pain management with dilaudid 1 mg every 3 hours IV PRN severe pain and percocet PO PRN moderate pain Active Problems:   HYPOTHYROIDISM - continue levothyroxine   COPD - stable - may continue atrovent nebulizer QID and as needed every 2 hours   Atrial fibrillation - coumadin on hold in anticipation of surgery in am - rate controlled with cardizem   UTI - on empiric rocephin - follow up urine culture results  Consultants:  Orthopedic surgery   Procedures/Studies: Dg Chest 1 View 12/08/2013   IMPRESSION: 1. No acute thoracic findings. 2. Atherosclerotic aortic arch. 3. Bony demineralization.      Dg Hip Complete Left 12/08/2013    IMPRESSION: 1. Acute left hip intertrochanteric fracture. Separate lesser trochanteric fragment. 2. Bilateral hip arthropathy with axial loss of articular space and mild protrusio.      Antibiotics:  Rocephin 12/08/2013 -->  Code Status: Full Family Communication: Pt at bedside Disposition Plan: Home when medically stable  HPI/Subjective: No events overnight.   Objective: Filed Vitals:   12/08/13 2155 12/09/13 0536 12/09/13 0909 12/09/13 1458  BP:   115/47 104/38 94/44  Pulse: 100 85  77  Temp:  98.5 F (36.9 C)  98.4 F (36.9 C)  TempSrc:  Oral  Oral  Resp: 16 14  18   Height:      Weight:      SpO2: 95% 97%  97%    Intake/Output Summary (Last 24 hours) at 12/09/13 1820 Last data filed at 12/09/13 1300  Gross per 24 hour  Intake    530 ml  Output    550 ml  Net    -20 ml    Exam:   General:  Pt is not in acute distress  Cardiovascular: irregular rhythm, S1/S2 appreciated  Respiratory: Clear to auscultation bilaterally, no wheezing, no crackles, no rhonchi  Abdomen: Soft, non tender, non distended, bowel sounds present, no guarding  Extremities: No edema, pulses DP and PT palpable bilaterally; left TKA  Neuro: Grossly nonfocal  Data Reviewed: Basic Metabolic Panel:  Recent Labs Lab 12/08/13 1707  NA 140  K 4.5  CL 103  CO2 29  GLUCOSE 117*  BUN 14  CREATININE 0.89  CALCIUM 9.2   Liver Function Tests:  Recent Labs Lab 12/08/13 1707  AST 18  ALT 16  ALKPHOS 59  BILITOT 0.4  PROT 7.2  ALBUMIN 3.6   No results found for this basename: LIPASE, AMYLASE,  in the last 168 hours No results found for this basename: AMMONIA,  in the last 168 hours CBC:  Recent Labs Lab 12/08/13 1707 12/09/13 0452  WBC 19.9* 8.1  NEUTROABS 18.0*  --   HGB 14.7 12.8  HCT 43.1 38.9  MCV 91.3 91.5  PLT 203 166   Cardiac Enzymes: No results found for this basename: CKTOTAL, CKMB, CKMBINDEX, TROPONINI,  in the last 168 hours BNP: No components found with this basename: POCBNP,  CBG: No results found for this basename: GLUCAP,  in the last 168 hours  No results found for this or any previous visit (from the past 240 hour(s)).   Scheduled Meds: . cefTRIAXone   1 g Intravenous Q24H  . diltiazem  120 mg Oral Daily  . ipratropium  500 mcg Nebulization QID  . levothyroxine  125 mcg Oral QAC breakfast    Faye Ramsay, MD  [facility] Pager [phone]  If 7PM-7AM, please contact  night-coverage [email or web address] Password [email or web address] 12/09/2013, 6:20 PM   LOS: 1 day

## 2013-12-09 NOTE — Consult Note (Addendum)
Reason for Consult:Left femur fracture Referring Physician: Dr. Vernell Leep is an 80 y.o. female.  HPI: [name] is a 80 yo female who had a fall at home in her kitchen yesterday when she tripped over the edge of a mat. She landed on her left side with immediate left hip pain and inability to get up. She did not have any head trauma or LOC. She did not have any dizziness, lightheadedness, chest pain, syncope, nausea, vomiting, palpitations or other systemic symptoms associated with the fall. Only complaint this AM is left hip pain. Denies numbness or tingling LLE. Comfortable in bed as long as nobody moves her. I know the patient from performing a left TKA on her several years ago  Past Medical History  Diagnosis Date  . Hypothyroidism   . Psoriatic arthritis   . IBS (irritable bowel syndrome)   . Hemorrhoids   . Diverticulosis   . Rectal polyp   . GERD (gastroesophageal reflux disease)   . Family hx of colon cancer   . Uterine polyp   . Arthritis   . COPD (chronic obstructive pulmonary disease)   . Lymphocytic colitis     Past Surgical History  Procedure Laterality Date  . Knee surgery      lt.  Marland Kitchen Uterine polypectomy    . Cholecystectomy  Dec 04, 2010  . Total knee revision  08/06/2012    Procedure: TOTAL KNEE REVISION;  Surgeon: [name], MD;  Location: [facility];  Service: Orthopedics;  Laterality: Left;  Left Total Knee Arthroplasty Revision    Family History  Problem Relation Age of Onset  . Heart disease Father     pacemaker  . Lung cancer Father   . Colon cancer Mother     Social History:  reports that she quit smoking about 33 years ago. Her smoking use included Cigarettes. She has a 20 pack-year smoking history. She has never used smokeless tobacco. She reports that she drinks alcohol. She reports that she does not use illicit drugs.  Allergies:  Allergies  Allergen Reactions  . Codeine Itching    Medications: I have reviewed the patient's  current medications.  Results for orders placed during the hospital encounter of 12/08/13 (from the past 48 hour(s))  CBC WITH DIFFERENTIAL     Status: Abnormal   Collection Time    12/08/13  5:07 PM      Result Value Ref Range   WBC 19.9 (*) 4.0 - 10.5 K/uL   RBC 4.72  3.87 - 5.11 MIL/uL   Hemoglobin 14.7  12.0 - 15.0 g/dL   HCT 43.1  36.0 - 46.0 %   MCV 91.3  78.0 - 100.0 fL   MCH 31.1  26.0 - 34.0 pg   MCHC 34.1  30.0 - 36.0 g/dL   RDW 13.2  11.5 - 15.5 %   Platelets 203  150 - 400 K/uL   Neutrophils Relative % 90 (*) 43 - 77 %   Neutro Abs 18.0 (*) 1.7 - 7.7 K/uL   Lymphocytes Relative 5 (*) 12 - 46 %   Lymphs Abs 1.0  0.7 - 4.0 K/uL   Monocytes Relative 4  3 - 12 %   Monocytes Absolute 0.8  0.1 - 1.0 K/uL   Eosinophils Relative 0  0 - 5 %   Eosinophils Absolute 0.1  0.0 - 0.7 K/uL   Basophils Relative 0  0 - 1 %   Basophils Absolute 0.0  0.0 -  0.1 K/uL  COMPREHENSIVE METABOLIC PANEL     Status: Abnormal   Collection Time    12/08/13  5:07 PM      Result Value Ref Range   Sodium 140  137 - 147 mEq/L   Potassium 4.5  3.7 - 5.3 mEq/L   Chloride 103  96 - 112 mEq/L   CO2 29  19 - 32 mEq/L   Glucose, Bld 117 (*) 70 - 99 mg/dL   BUN 14  6 - 23 mg/dL   Creatinine, Ser 0.89  0.50 - 1.10 mg/dL   Calcium 9.2  8.4 - 10.5 mg/dL   Total Protein 7.2  6.0 - 8.3 g/dL   Albumin 3.6  3.5 - 5.2 g/dL   AST 18  0 - 37 U/L   ALT 16  0 - 35 U/L   Alkaline Phosphatase 59  39 - 117 U/L   Total Bilirubin 0.4  0.3 - 1.2 mg/dL   GFR calc non Af Amer 60 (*) >90 mL/min   GFR calc Af Amer 70 (*) >90 mL/min   Comment: (NOTE)     The eGFR has been calculated using the CKD EPI equation.     This calculation has not been validated in all clinical situations.     eGFR's persistently <90 mL/min signify possible Chronic Kidney     Disease.  PROTIME-INR     Status: Abnormal   Collection Time    12/08/13  5:07 PM      Result Value Ref Range   Prothrombin Time 23.1 (*) 11.6 - 15.2 seconds   INR  2.12 (*) 0.00 - 1.49  URINALYSIS, ROUTINE W REFLEX MICROSCOPIC     Status: Abnormal   Collection Time    12/08/13  6:45 PM      Result Value Ref Range   Color, Urine YELLOW  YELLOW   APPearance CLEAR  CLEAR   Specific Gravity, Urine 1.020  1.005 - 1.030   pH 7.0  5.0 - 8.0   Glucose, UA NEGATIVE  NEGATIVE mg/dL   Hgb urine dipstick TRACE (*) NEGATIVE   Bilirubin Urine NEGATIVE  NEGATIVE   Ketones, ur NEGATIVE  NEGATIVE mg/dL   Protein, ur NEGATIVE  NEGATIVE mg/dL   Urobilinogen, UA 0.2  0.0 - 1.0 mg/dL   Nitrite NEGATIVE  NEGATIVE   Leukocytes, UA NEGATIVE  NEGATIVE  URINE MICROSCOPIC-ADD ON     Status: Abnormal   Collection Time    12/08/13  6:45 PM      Result Value Ref Range   Squamous Epithelial / LPF FEW (*) RARE   WBC, UA 3-6  <3 WBC/hpf   RBC / HPF 3-6  <3 RBC/hpf   Bacteria, UA FEW (*) RARE  CBC     Status: None   Collection Time    12/09/13  4:52 AM      Result Value Ref Range   WBC 8.1  4.0 - 10.5 K/uL   RBC 4.25  3.87 - 5.11 MIL/uL   Hemoglobin 12.8  12.0 - 15.0 g/dL   HCT 38.9  36.0 - 46.0 %   MCV 91.5  78.0 - 100.0 fL   MCH 30.1  26.0 - 34.0 pg   MCHC 32.9  30.0 - 36.0 g/dL   RDW 13.4  11.5 - 15.5 %   Platelets 166  150 - 400 K/uL    Dg Chest 1 View  12/08/2013   CLINICAL DATA:  Left hip and leg pain.  Fall.  EXAM:  CHEST - 1 VIEW  COMPARISON:  DG CHEST 1V PORT dated 04/10/2013; DG CHEST 2 VIEW dated 07/31/2012; DG HIP COMPLETE*L* dated 12/08/2013  FINDINGS: Atherosclerotic aortic arch. Heart size within normal limits. The lungs appear grossly clear. Bony demineralization noted.  IMPRESSION: 1. No acute thoracic findings. 2. Atherosclerotic aortic arch. 3. Bony demineralization.   Electronically Signed   By: Sherryl Barters M.D.   On: 12/08/2013 18:01   Dg Hip Complete Left  12/08/2013   CLINICAL DATA:  Left hip pain after fall.  EXAM: LEFT HIP - COMPLETE 2+ VIEW  COMPARISON:  None.  FINDINGS: Acute left intertrochanteric fracture. Separate lesser trochanteric  fragment.  Axial loss of articular space in both hips with mild protrusio. Degenerative spurring of both femoral heads.  IMPRESSION: 1. Acute left hip intertrochanteric fracture. Separate lesser trochanteric fragment. 2. Bilateral hip arthropathy with axial loss of articular space and mild protrusio.   Electronically Signed   By: Sherryl Barters M.D.   On: 12/08/2013 18:02    Review of Systems  Constitutional: Negative.   HENT: Negative.   Eyes: Negative.   Respiratory: Negative.   Cardiovascular: Negative.   Gastrointestinal: Negative.   Genitourinary: Negative.   Musculoskeletal: Positive for falls and joint pain.  Skin: Negative.   Neurological: Negative.   Endo/Heme/Allergies: Negative.   Psychiatric/Behavioral: Negative.    Blood pressure 115/47, pulse 85, temperature 98.5 F (36.9 C), temperature source Oral, resp. rate 14, height 5' 5" (1.651 m), weight 178 lb (80.74 kg), SpO2 97.00%. Physical Exam Physical Examination: General appearance - alert, well appearing, and in no distress Mental status - alert, oriented to person, place, and time Chest - clear to auscultation, no wheezes, rales or rhonchi, symmetric air entry Heart - normal rate, regular rhythm, normal S1, S2, no murmurs, rubs, clicks or gallops Abdomen - soft, nontender, nondistended, no masses or organomegaly Neurological - alert, oriented, normal speech, no focal findings or movement disorder noted LLE shortened and externally rotated; motor and sensation intact; toes warm; pulses non-palpable   Assessment/Plan: Left intertrochanteric femur fracture- Plan operative fixation with intramedullary nail. Awaiting INR from today. Probably will not do today as INR last night was >2. I have discussed the procedure risks and potential complications with the patient and she elects to proceed with surgery. Will decide on surgery today vs. Tomorrow later this morning.  [name] 12/09/2013, 7:39 AM

## 2013-12-10 DIAGNOSIS — R1084 Generalized abdominal pain: Secondary | ICD-10-CM

## 2013-12-10 DIAGNOSIS — J309 Allergic rhinitis, unspecified: Secondary | ICD-10-CM

## 2013-12-10 DIAGNOSIS — D62 Acute posthemorrhagic anemia: Secondary | ICD-10-CM

## 2013-12-10 LAB — CBC
HEMATOCRIT: 37.6 % (ref 36.0–46.0)
HEMOGLOBIN: 12.1 g/dL (ref 12.0–15.0)
MCH: 29.5 pg (ref 26.0–34.0)
MCHC: 32.2 g/dL (ref 30.0–36.0)
MCV: 91.7 fL (ref 78.0–100.0)
Platelets: 149 10*3/uL — ABNORMAL LOW (ref 150–400)
RBC: 4.1 MIL/uL (ref 3.87–5.11)
RDW: 13.2 % (ref 11.5–15.5)
WBC: 8.2 10*3/uL (ref 4.0–10.5)

## 2013-12-10 LAB — BASIC METABOLIC PANEL
BUN: 13 mg/dL (ref 6–23)
CHLORIDE: 98 meq/L (ref 96–112)
CO2: 27 meq/L (ref 19–32)
Calcium: 8.2 mg/dL — ABNORMAL LOW (ref 8.4–10.5)
Creatinine, Ser: 0.75 mg/dL (ref 0.50–1.10)
GFR calc Af Amer: >90 mL/min (ref >90)
GFR calc non Af Amer: 79 mL/min — ABNORMAL LOW (ref >90)
GLUCOSE: 121 mg/dL — AB (ref 70–99)
Potassium: 4.4 mEq/L (ref 3.7–5.3)
SODIUM: 134 meq/L — AB (ref 137–147)

## 2013-12-10 LAB — TYPE AND SCREEN
ABO/RH(D): O POS
ANTIBODY SCREEN: NEGATIVE

## 2013-12-10 LAB — SURGICAL PCR SCREEN
MRSA, PCR: NEGATIVE
Staphylococcus aureus: NEGATIVE

## 2013-12-10 LAB — PROTIME-INR
INR: 1.57 — AB (ref 0.00–1.49)
PROTHROMBIN TIME: 18.3 s — AB (ref 11.6–15.2)

## 2013-12-10 LAB — HEPARIN LEVEL (UNFRACTIONATED): HEPARIN UNFRACTIONATED: 0.21 [IU]/mL — AB (ref 0.30–0.70)

## 2013-12-10 MED ORDER — DIPHENHYDRAMINE HCL 25 MG PO CAPS
25.0000 mg | ORAL_CAPSULE | Freq: Three times a day (TID) | ORAL | Status: DC | PRN
  Administered 2013-12-10 – 2013-12-11 (×2): 25 mg via ORAL
  Filled 2013-12-10 (×3): qty 1

## 2013-12-10 MED ORDER — HEPARIN (PORCINE) IN NACL 100-0.45 UNIT/ML-% IJ SOLN
1350.0000 [IU]/h | INTRAMUSCULAR | Status: DC
  Administered 2013-12-10: 1100 [IU]/h via INTRAVENOUS
  Filled 2013-12-10 (×2): qty 250

## 2013-12-10 NOTE — Progress Notes (Signed)
ANTICOAGULATION CONSULT NOTE - Initial Consult  Pharmacy Consult for Heparin Indication: atrial fibrillation   Allergies  Allergen Reactions  . Codeine Itching    Patient Measurements: Height: 5\' 5"  (165.1 cm) Weight: 178 lb (80.74 kg) IBW/kg (Calculated) : 57 Heparin Dosing Weight: 75 kg  Vital Signs: Temp: 98.7 F (37.1 C) (03/05 0500) Temp src: Oral (03/05 0500) BP: 129/52 mmHg (03/05 1014) Pulse Rate: 76 (03/05 0500)  Labs:  Recent Labs  12/08/13 1707 12/09/13 0452 12/09/13 0830 12/10/13 0425  HGB 14.7 12.8  --  12.1  HCT 43.1 38.9  --  37.6  PLT 203 166  --  149*  LABPROT 23.1*  --  23.5* 18.3*  INR 2.12*  --  2.17* 1.57*  CREATININE 0.89  --   --  0.75    Estimated Creatinine Clearance: 60.8 ml/min (by C-G formula based on Cr of 0.75).   Medical History: Past Medical History  Diagnosis Date  . Hypothyroidism   . Psoriatic arthritis   . IBS (irritable bowel syndrome)   . Hemorrhoids   . Diverticulosis   . Rectal polyp   . GERD (gastroesophageal reflux disease)   . Family hx of colon cancer   . Uterine polyp   . Arthritis   . COPD (chronic obstructive pulmonary disease)   . Lymphocytic colitis     Medications:  Scheduled:  . cefTRIAXone (ROCEPHIN)  IV  1 g Intravenous Q24H  . diltiazem  120 mg Oral Daily  . fluticasone  1 spray Each Nare Daily  . ipratropium  500 mcg Nebulization QID  . levothyroxine  125 mcg Oral QAC breakfast  . sodium chloride  3 mL Intravenous Q12H   Infusions:    Assessment: 66 yoF admitted 3/3 after falling and found to have L hip fracture.  PMH includes Afib on chronic anticoagulation with warfarin.  Dose was 5mg  daily except 7.5 mg Tue/Thur with last dose taken on 3/2.  INR on admission was therapeutic at 2.12, but warfarin was held for planned surgical intervention.  Pharmacy is now consulted to dose IV Heparin while warfarin is held and INR is subtherapeutic.  INR decreased to 1.57, after holding doses on 3/3 and  3/4  CBC: Hgb 12.1 and Plt 149  SCr 0.75 with CrCl ~ 61 ml/min  Noted plans for surgery on 3/6  Goal of Therapy:  Heparin level 0.3-0.7 units/ml Monitor platelets by anticoagulation protocol: Yes   Plan:   No heparin bolus due to elevated INR  Start heparin IV infusion at 1100 units/hr  Heparin level 8 hours after starting  Daily INR, heparin level, and CBC  Continue to monitor H&H and platelets  Gretta Arab PharmD, BCPS Pager [phone] 12/10/2013 10:52 AM

## 2013-12-10 NOTE — Progress Notes (Signed)
   Subjective: Hospital day - 2 Patient reports pain as mild.   Patient seen in rounds with Dr. Wynelle Link.  Fairly comfortable today.  No complaints.  Plan on surgery tomorrow afternoon. Will order consent for hip fixation. Patient is well, but has had some minor complaints of pain in the hip, requiring pain medications Plan is to go Skilled nursing facility after hospital stay.  Objective: Vital signs in last 24 hours: Temp:  [98.4 F (36.9 C)-98.7 F (37.1 C)] 98.7 F (37.1 C) (03/05 0500) Pulse Rate:  [76-79] 76 (03/05 0500) Resp:  [18] 18 (03/05 0500) BP: (94-115)/(38-70) 115/70 mmHg (03/05 0500) SpO2:  [95 %-97 %] 96 % (03/05 0500)  Intake/Output from previous day:  Intake/Output Summary (Last 24 hours) at 12/10/13 0745 Last data filed at 12/10/13 0700  Gross per 24 hour  Intake   1013 ml  Output    700 ml  Net    313 ml    Intake/Output this shift:    Labs:  Recent Labs  12/08/13 1707 12/09/13 0452 12/10/13 0425  HGB 14.7 12.8 12.1    Recent Labs  12/09/13 0452 12/10/13 0425  WBC 8.1 8.2  RBC 4.25 4.10  HCT 38.9 37.6  PLT 166 149*    Recent Labs  12/08/13 1707 12/10/13 0425  NA 140 134*  K 4.5 4.4  CL 103 98  CO2 29 27  BUN 14 13  CREATININE 0.89 0.75  GLUCOSE 117* 121*  CALCIUM 9.2 8.2*    Recent Labs  12/09/13 0830 12/10/13 0425  INR 2.17* 1.57*    EXAM General - Patient is Alert and Appropriate Extremity - Neurovascular intact Sensation intact distally to left leg Motor Function - intact, moving foot and toes well on exam.   Past Medical History  Diagnosis Date  . Hypothyroidism   . Psoriatic arthritis   . IBS (irritable bowel syndrome)   . Hemorrhoids   . Diverticulosis   . Rectal polyp   . GERD (gastroesophageal reflux disease)   . Family hx of colon cancer   . Uterine polyp   . Arthritis   . COPD (chronic obstructive pulmonary disease)   . Lymphocytic colitis     Assessment/Plan: Hospital day - 2 Principal  Problem:   Fracture of hip, left, closed Active Problems:   HYPOTHYROIDISM   COPD   COLITIS   ARTHRITIS   Atrial fibrillation   Hip fracture  Estimated body mass index is 29.62 kg/(m^2) as calculated from the following:   Height as of this encounter: 5' 5"  (1.651 m).   Weight as of this encounter: 80.74 kg (178 lb).  Continue bed rest today. Allow diet today. Plan for surgery tomorrow afternoon. NPO after MN.  Might allow clear liquids in the AM depending upon time of surgery.  Will check with the OR schedule. Consent for left hip nailing by Dr. Wynelle Link. Social worker for needed SNF placement following hip surgery.  PERKINS, ALEXZANDREW 12/10/2013, 7:45 AM

## 2013-12-10 NOTE — Progress Notes (Signed)
Clinical Social Work Department CLINICAL SOCIAL WORK PLACEMENT NOTE 12/10/2013  Patient:  [name], [name]  Account Number:  [id number] Admit date:  12/08/2013  Clinical Social Worker:  Renold Genta  Date/time:  12/10/2013 03:31 PM  Clinical Social Work is seeking post-discharge placement for this patient at the following level of care:   SKILLED NURSING   (*CSW will update this form in Epic as items are completed)   12/10/2013  Patient/family provided with [place] Department of Clinical Social Work's list of facilities offering this level of care within the geographic area requested by the patient (or if unable, by the patient's family).  12/10/2013  Patient/family informed of their freedom to choose among providers that offer the needed level of care, that participate in Medicare, Medicaid or managed care program needed by the patient, have an available bed and are willing to accept the patient.  12/10/2013  Patient/family informed of [facility]' ownership interest in [facility], as well as of the fact that they are under no obligation to receive care at this facility.  PASARR submitted to EDS on 12/10/2013 PASARR number received from EDS on 12/10/2013  FL2 transmitted to all facilities in geographic area requested by pt/family on  12/10/2013 FL2 transmitted to all facilities within larger geographic area on   Patient informed that his/her managed care company has contracts with or will negotiate with  certain facilities, including the following:     Patient/family informed of bed offers received:  12/10/2013 Patient chooses bed at [facility] Physician recommends and patient chooses bed at    Patient to be transferred to [facility] on   Patient to be transferred to facility by   The following physician request were entered in Epic:   Additional Comments:   Raynaldo Opitz, [place] Worker cell #:  [phone]

## 2013-12-10 NOTE — Progress Notes (Signed)
Patient ID: [name], female   DOB: 01-21-1933, 80 y.o.   MRN: [id number]  [facility] PROGRESS NOTE  Shakeena Kafer Lamar ZDG:[id number] DOB: 1933-05-07 DOA: 12/08/2013 PCP: Asencion Noble, MD  Brief narrative:  80 year old female with past medical history of COPD, atrial fibrillation on AC, hypothyroidism, left TKA, IBS who presented to [facility] ED 12/08/2013 status post mechanical fall and subsequent left hip fracture. In ED, vitals were stable. Blood work revealed WBC count of 19.9 and INR of 2.12. Her urinalysis was unremarkable and CXR did not reveal acute cardiopulmonary findings. She was found to have left intertrochanteric fracture with plan for surgery tomorrow 12/10/2013. She was additionally started on empiric rocephin for possible UTI.   Assessment and Plan:  Principal Problem:  Fracture of hip, left, closed  - management per orhtopedic surgery, plan for surgery tomorrow afternoon  - continue pain management with dilaudid 1 mg every 3 hours IV PRN severe pain and percocet PO PRN moderate pain  Active Problems:  Leukocytosis - secondary to principal problem - no signs of an infectious etiology - now resolved  HYPOTHYROIDISM  - continue levothyroxine  COPD  - stable  - may continue atrovent nebulizer QID and as needed every 2 hours  Atrial fibrillation  - coumadin on hold in anticipation of surgery in am  - rate controlled with cardizem  UTI - on empiric rocephin  - urine culture with no growth   Consultants:  Orthopedic surgery  Procedures/Studies:  Dg Chest 1 View  12/08/2013 No acute thoracic findings.  Atherosclerotic aortic arch. Bony demineralization.  Dg Hip Complete Left 12/08/2013 Acute left hip intertrochanteric fracture. Separate lesser trochanteric fragment. Bilateral hip arthropathy with axial loss of articular space. Antibiotics:  Rocephin 12/08/2013 -->  Code Status: Full  Family Communication: Pt at bedside  Disposition Plan: Home when medically  stable  HPI/Subjective: No events overnight.   Objective: Filed Vitals:   12/10/13 0500 12/10/13 0811 12/10/13 1014 12/10/13 1500  BP: 115/70  129/52 115/54  Pulse: 76   84  Temp: 98.7 F (37.1 C)   98 F (36.7 C)  TempSrc: Oral   Oral  Resp: 18   18  Height:      Weight:      SpO2: 96% 97%  98%    Intake/Output Summary (Last 24 hours) at 12/10/13 1715 Last data filed at 12/10/13 1500  Gross per 24 hour  Intake    773 ml  Output    450 ml  Net    323 ml    Exam:   General:  Pt is alert, follows commands appropriately, not in acute distress  Cardiovascular: Regular rate and rhythm, S1/S2, no murmurs, no rubs, no gallops  Respiratory: Clear to auscultation bilaterally, no wheezing, no crackles, no rhonchi  Abdomen: Soft, non tender, non distended, bowel sounds present, no guarding  Extremities: No edema, pulses DP and PT palpable bilaterally  Neuro: Grossly nonfocal  Data Reviewed: Basic Metabolic Panel:  Recent Labs Lab 12/08/13 1707 12/10/13 0425  NA 140 134*  K 4.5 4.4  CL 103 98  CO2 29 27  GLUCOSE 117* 121*  BUN 14 13  CREATININE 0.89 0.75  CALCIUM 9.2 8.2*   Liver Function Tests:  Recent Labs Lab 12/08/13 1707  AST 18  ALT 16  ALKPHOS 59  BILITOT 0.4  PROT 7.2  ALBUMIN 3.6   CBC:  Recent Labs Lab 12/08/13 1707 12/09/13 0452 12/10/13 0425  WBC 19.9* 8.1 8.2  NEUTROABS 18.0*  --   --  HGB 14.7 12.8 12.1  HCT 43.1 38.9 37.6  MCV 91.3 91.5 91.7  PLT 203 166 149*   Recent Results (from the past 240 hour(s))  URINE CULTURE     Status: None   Collection Time    12/08/13  9:58 PM      Result Value Ref Range Status   Specimen Description URINE, CATHETERIZED   Final   Special Requests NONE   Final   Culture  Setup Time     Final   Value: 12/09/2013 01:14     Performed at [facility]     Final   Value: NO GROWTH     Performed at [facility]   Culture     Final   Value: NO GROWTH      Performed at [facility]   Report Status 12/09/2013 FINAL   Final  SURGICAL PCR SCREEN     Status: None   Collection Time    12/10/13  5:15 AM      Result Value Ref Range Status   MRSA, PCR NEGATIVE  NEGATIVE Final   Staphylococcus aureus NEGATIVE  NEGATIVE Final   Comment:            The Xpert SA Assay (FDA     approved for NASAL specimens     in patients over 31 years of age),     is one component of     a comprehensive surveillance     program.  Test performance has     been validated by [employer] for patients greater     than or equal to 34 year old.     It is not intended     to diagnose infection nor to     guide or monitor treatment.     Scheduled Meds: . cefTRIAXone (ROCEPHIN)  IV  1 g Intravenous Q24H  . diltiazem  120 mg Oral Daily  . fluticasone  1 spray Each Nare Daily  . ipratropium  500 mcg Nebulization QID  . levothyroxine  125 mcg Oral QAC breakfast  . sodium chloride  3 mL Intravenous Q12H   Continuous Infusions: . heparin 1,100 Units/hr (12/10/13 1227)   Faye Ramsay, MD  [facility] Pager [phone]  If 7PM-7AM, please contact night-coverage [email or web address] Password [email or web address] 12/10/2013, 5:15 PM   LOS: 2 days

## 2013-12-10 NOTE — Progress Notes (Addendum)
PHARMACY BRIEF NOTE - Anticoagulation  Consult for:  IV Heparin Indication:  Atrial fibrillation and Warfarin on hold for surgery  With the infusion of 1100 units/hr, the Heparin level drawn at 8 p.m. today is reported as 0.21 units/ml.  This level is below the therapeutic range, 0.3-0.7 units/ml.    The patient's nurse is not aware of any bleeding or interruption in the infusion.  Plan:  Increase the infusion rate to 1350 units/hr.   Discontinue Heparin at 6:00 a.m. 3/6 as ordered for surgery.  MorrisonPh. 12/10/2013 9:10 PM

## 2013-12-10 NOTE — Progress Notes (Signed)
Clinical Social Work Department BRIEF PSYCHOSOCIAL ASSESSMENT 12/10/2013  Patient:  [name], [name]     Account Number:  [id number]     Admit date:  12/08/2013  Clinical Social Worker:  Renold Genta  Date/Time:  12/10/2013 03:27 PM  Referred by:  Physician  Date Referred:  12/10/2013 Referred for  SNF Placement   Other Referral:   Interview type:  Patient Other interview type:    PSYCHOSOCIAL DATA Living Status:  ALONE Admitted from facility:   Level of care:   Primary support name:  [name] (dtr) ph#: [phone] Primary support relationship to patient:  CHILD, ADULT Degree of support available:   good    CURRENT CONCERNS Current Concerns  Post-Acute Placement   Other Concerns:    SOCIAL WORK ASSESSMENT / PLAN CSW received referral for possible SNF placement.   Assessment/plan status:  Information/Referral to [employer] Other assessment/ plan:   Information/referral to community resources:   CSW completed FL2 and faxed information to [facility] - provided bed offers to patient.    PATIENT'S/FAMILY'S RESPONSE TO PLAN OF CARE: Patient informed CSW that she would prefer [facility] as she was there back in October 2013 for a knee replacement and had a good experience.    CSW contacted [facility] to make them aware of patient's preference. Will follow-up Monday for possible discharge.       Raynaldo Opitz, [facility] Clinical Social Worker cell #: [phone]

## 2013-12-11 ENCOUNTER — Inpatient Hospital Stay (HOSPITAL_COMMUNITY): Payer: Medicare Other

## 2013-12-11 ENCOUNTER — Inpatient Hospital Stay (HOSPITAL_COMMUNITY): Payer: Medicare Other | Admitting: Anesthesiology

## 2013-12-11 ENCOUNTER — Encounter (HOSPITAL_COMMUNITY): Payer: Medicare Other | Admitting: Anesthesiology

## 2013-12-11 ENCOUNTER — Encounter (HOSPITAL_COMMUNITY)
Admission: EM | Disposition: A | Discharge status: Skilled Nursing Facility | Payer: Self-pay | Source: Home / Self Care | Attending: Internal Medicine

## 2013-12-11 ENCOUNTER — Encounter (HOSPITAL_COMMUNITY): Payer: Self-pay | Admitting: Registered Nurse

## 2013-12-11 HISTORY — PX: FEMUR IM NAIL: SHX1597

## 2013-12-11 LAB — CBC
HCT: 34.9 % — ABNORMAL LOW (ref 36.0–46.0)
HEMOGLOBIN: 11.6 g/dL — AB (ref 12.0–15.0)
MCH: 30.1 pg (ref 26.0–34.0)
MCHC: 33.2 g/dL (ref 30.0–36.0)
MCV: 90.6 fL (ref 78.0–100.0)
Platelets: 143 10*3/uL — ABNORMAL LOW (ref 150–400)
RBC: 3.85 MIL/uL — ABNORMAL LOW (ref 3.87–5.11)
RDW: 13 % (ref 11.5–15.5)
WBC: 7.8 10*3/uL (ref 4.0–10.5)

## 2013-12-11 LAB — BASIC METABOLIC PANEL
BUN: 9 mg/dL (ref 6–23)
CHLORIDE: 97 meq/L (ref 96–112)
CO2: 29 mEq/L (ref 19–32)
Calcium: 8.1 mg/dL — ABNORMAL LOW (ref 8.4–10.5)
Creatinine, Ser: 0.69 mg/dL (ref 0.50–1.10)
GFR calc Af Amer: >90 mL/min (ref >90)
GFR calc non Af Amer: 81 mL/min — ABNORMAL LOW (ref >90)
GLUCOSE: 121 mg/dL — AB (ref 70–99)
POTASSIUM: 4.5 meq/L (ref 3.7–5.3)
Sodium: 135 mEq/L — ABNORMAL LOW (ref 137–147)

## 2013-12-11 LAB — PROTIME-INR
INR: 1.33 (ref 0.00–1.49)
Prothrombin Time: 16.2 seconds — ABNORMAL HIGH (ref 11.6–15.2)

## 2013-12-11 SURGERY — INSERTION, INTRAMEDULLARY ROD, FEMUR
Anesthesia: General | Site: Hip | Laterality: Left

## 2013-12-11 MED ORDER — ONDANSETRON HCL 4 MG/2ML IJ SOLN
INTRAMUSCULAR | Status: DC | PRN
  Administered 2013-12-11: 4 mg via INTRAVENOUS

## 2013-12-11 MED ORDER — POLYETHYLENE GLYCOL 3350 17 G PO PACK
17.0000 g | PACK | Freq: Every day | ORAL | Status: DC | PRN
  Filled 2013-12-11: qty 1

## 2013-12-11 MED ORDER — EPHEDRINE SULFATE 50 MG/ML IJ SOLN
INTRAMUSCULAR | Status: AC
  Filled 2013-12-11: qty 1

## 2013-12-11 MED ORDER — SODIUM CHLORIDE 0.9 % IJ SOLN
INTRAMUSCULAR | Status: AC
  Filled 2013-12-11: qty 10

## 2013-12-11 MED ORDER — ONDANSETRON HCL 4 MG/2ML IJ SOLN
INTRAMUSCULAR | Status: AC
  Filled 2013-12-11: qty 2

## 2013-12-11 MED ORDER — HYDROMORPHONE HCL PF 1 MG/ML IJ SOLN
INTRAMUSCULAR | Status: AC
  Filled 2013-12-11: qty 1

## 2013-12-11 MED ORDER — 0.9 % SODIUM CHLORIDE (POUR BTL) OPTIME
TOPICAL | Status: DC | PRN
  Administered 2013-12-11: 1000 mL

## 2013-12-11 MED ORDER — ACETAMINOPHEN 325 MG PO TABS: 650.0000 mg | ORAL_TABLET | Freq: Four times a day (QID) | ORAL | Status: DC | PRN

## 2013-12-11 MED ORDER — LACTATED RINGERS IV SOLN
INTRAVENOUS | Status: DC
  Administered 2013-12-11: 1000 mL via INTRAVENOUS

## 2013-12-11 MED ORDER — WARFARIN - PHARMACIST DOSING INPATIENT: Freq: Every day | Status: DC

## 2013-12-11 MED ORDER — FENTANYL CITRATE 0.05 MG/ML IJ SOLN
INTRAMUSCULAR | Status: DC | PRN
  Administered 2013-12-11 (×3): 50 ug via INTRAVENOUS

## 2013-12-11 MED ORDER — LIDOCAINE HCL (CARDIAC) 20 MG/ML IV SOLN
INTRAVENOUS | Status: AC
  Filled 2013-12-11: qty 5

## 2013-12-11 MED ORDER — LACTATED RINGERS IV SOLN: INTRAVENOUS | Status: DC

## 2013-12-11 MED ORDER — PHENOL 1.4 % MT LIQD: 1.0000 | OROMUCOSAL | Status: DC | PRN

## 2013-12-11 MED ORDER — SUCCINYLCHOLINE CHLORIDE 20 MG/ML IJ SOLN
INTRAMUSCULAR | Status: DC | PRN
  Administered 2013-12-11: 100 mg via INTRAVENOUS

## 2013-12-11 MED ORDER — PROPOFOL 10 MG/ML IV BOLUS
INTRAVENOUS | Status: AC
  Filled 2013-12-11: qty 20

## 2013-12-11 MED ORDER — CEFAZOLIN SODIUM-DEXTROSE 2-3 GM-% IV SOLR
2.0000 g | Freq: Once | INTRAVENOUS | Status: AC
  Administered 2013-12-11: 2 g via INTRAVENOUS

## 2013-12-11 MED ORDER — WARFARIN SODIUM 7.5 MG PO TABS
7.5000 mg | ORAL_TABLET | Freq: Once | ORAL | Status: AC
  Administered 2013-12-11: 7.5 mg via ORAL
  Filled 2013-12-11: qty 1

## 2013-12-11 MED ORDER — LIDOCAINE HCL (CARDIAC) 20 MG/ML IV SOLN
INTRAVENOUS | Status: DC | PRN
  Administered 2013-12-11: 80 mg via INTRAVENOUS

## 2013-12-11 MED ORDER — PROPOFOL 10 MG/ML IV BOLUS
INTRAVENOUS | Status: DC | PRN
Start: 2013-12-11 — End: 2013-12-11
  Administered 2013-12-11: 100 mg via INTRAVENOUS

## 2013-12-11 MED ORDER — METHOCARBAMOL 100 MG/ML IJ SOLN
500.0000 mg | Freq: Four times a day (QID) | INTRAMUSCULAR | Status: DC | PRN
  Administered 2013-12-11: 500 mg via INTRAVENOUS
  Filled 2013-12-11 (×2): qty 5

## 2013-12-11 MED ORDER — CEFAZOLIN SODIUM-DEXTROSE 2-3 GM-% IV SOLR
INTRAVENOUS | Status: AC
  Filled 2013-12-11: qty 50

## 2013-12-11 MED ORDER — FLEET ENEMA 7-19 GM/118ML RE ENEM
1.0000 | ENEMA | Freq: Once | RECTAL | Status: AC | PRN
  Filled 2013-12-11: qty 1

## 2013-12-11 MED ORDER — CEFAZOLIN SODIUM-DEXTROSE 2-3 GM-% IV SOLR
2.0000 g | Freq: Four times a day (QID) | INTRAVENOUS | Status: AC
  Administered 2013-12-11 – 2013-12-12 (×2): 2 g via INTRAVENOUS
  Filled 2013-12-11 (×2): qty 50

## 2013-12-11 MED ORDER — HYDROMORPHONE HCL PF 1 MG/ML IJ SOLN
0.2500 mg | INTRAMUSCULAR | Status: DC | PRN
  Administered 2013-12-11 (×3): 0.5 mg via INTRAVENOUS

## 2013-12-11 MED ORDER — FENTANYL CITRATE 0.05 MG/ML IJ SOLN
INTRAMUSCULAR | Status: AC
  Filled 2013-12-11: qty 5

## 2013-12-11 MED ORDER — ACETAMINOPHEN 650 MG RE SUPP
650.0000 mg | Freq: Four times a day (QID) | RECTAL | Status: DC | PRN
  Filled 2013-12-11: qty 1

## 2013-12-11 MED ORDER — MENTHOL 3 MG MT LOZG: 1.0000 | LOZENGE | OROMUCOSAL | Status: DC | PRN

## 2013-12-11 MED ORDER — BISACODYL 10 MG RE SUPP
10.0000 mg | Freq: Every day | RECTAL | Status: DC | PRN
  Filled 2013-12-11: qty 1

## 2013-12-11 MED ORDER — DOCUSATE SODIUM 100 MG PO CAPS
100.0000 mg | ORAL_CAPSULE | Freq: Two times a day (BID) | ORAL | Status: DC
  Administered 2013-12-12 – 2013-12-14 (×5): 100 mg via ORAL
  Filled 2013-12-11 (×8): qty 1

## 2013-12-11 MED ORDER — TRAMADOL HCL 50 MG PO TABS: 50.0000 mg | ORAL_TABLET | Freq: Three times a day (TID) | ORAL | Status: DC | PRN

## 2013-12-11 MED ORDER — SODIUM CHLORIDE 0.9 % IV SOLN
INTRAVENOUS | Status: DC
  Administered 2013-12-11 – 2013-12-13 (×3): via INTRAVENOUS

## 2013-12-11 MED ORDER — METHOCARBAMOL 500 MG PO TABS
500.0000 mg | ORAL_TABLET | Freq: Four times a day (QID) | ORAL | Status: DC | PRN
  Administered 2013-12-13 – 2013-12-14 (×2): 500 mg via ORAL
  Filled 2013-12-11 (×3): qty 1

## 2013-12-11 MED ORDER — ENOXAPARIN SODIUM 40 MG/0.4ML ~~LOC~~ SOLN
40.0000 mg | SUBCUTANEOUS | Status: DC
  Administered 2013-12-12 – 2013-12-14 (×3): 40 mg via SUBCUTANEOUS
  Filled 2013-12-11 (×4): qty 0.4

## 2013-12-11 MED ORDER — TRAMADOL HCL 50 MG PO TABS: 50.0000 mg | ORAL_TABLET | Freq: Four times a day (QID) | ORAL | Status: DC | PRN

## 2013-12-11 SURGICAL SUPPLY — 40 items
BAG SPEC THK2 15X12 ZIP CLS (MISCELLANEOUS)
BAG ZIPLOCK 12X15 (MISCELLANEOUS) ×1 IMPLANT
BANDAGE GAUZE ELAST BULKY 4 IN (GAUZE/BANDAGES/DRESSINGS) ×2 IMPLANT
BIT DRILL CANN LG 4.3MM (BIT) IMPLANT
BNDG COHESIVE 6X5 TAN STRL LF (GAUZE/BANDAGES/DRESSINGS) ×1 IMPLANT
CLOSURE WOUND 1/2 X4 (GAUZE/BANDAGES/DRESSINGS) ×2
DRAPE STERI IOBAN 125X83 (DRAPES) ×3 IMPLANT
DRAPE TABLE BACK 44X90 PK DISP (DRAPES) ×3 IMPLANT
DRILL BIT CANN LG 4.3MM (BIT) ×3
DRSG MEPILEX BORDER 4X4 (GAUZE/BANDAGES/DRESSINGS) ×2 IMPLANT
DRSG MEPILEX BORDER 4X8 (GAUZE/BANDAGES/DRESSINGS) ×2 IMPLANT
DURAPREP 26ML APPLICATOR (WOUND CARE) ×3 IMPLANT
ELECT REM PT RETURN 9FT ADLT (ELECTROSURGICAL) ×3
ELECTRODE REM PT RTRN 9FT ADLT (ELECTROSURGICAL) ×1 IMPLANT
GLOVE BIO SURGEON STRL SZ7.5 (GLOVE) ×5 IMPLANT
GLOVE BIO SURGEON STRL SZ8 (GLOVE) ×6 IMPLANT
GLOVE BIOGEL PI IND STRL 8 (GLOVE) ×1 IMPLANT
GLOVE BIOGEL PI INDICATOR 8 (GLOVE) ×2
GOWN STRL REUS W/TWL LRG LVL3 (GOWN DISPOSABLE) ×3 IMPLANT
GOWN STRL REUS W/TWL XL LVL3 (GOWN DISPOSABLE) ×5 IMPLANT
GUIDEPIN 3.2X17.5 THRD DISP (PIN) ×2 IMPLANT
HIP FRAC NAIL LAG SCR 10.5X100 (Orthopedic Implant) ×2 IMPLANT
KIT BASIN OR (CUSTOM PROCEDURE TRAY) ×3 IMPLANT
MANIFOLD NEPTUNE II (INSTRUMENTS) ×3 IMPLANT
NAIL HIP FRACT 130D 11X180 (Screw) ×2 IMPLANT
NS IRRIG 1000ML POUR BTL (IV SOLUTION) ×3 IMPLANT
PACK GENERAL/GYN (CUSTOM PROCEDURE TRAY) ×3 IMPLANT
PAD ABD 8X10 STRL (GAUZE/BANDAGES/DRESSINGS) ×1 IMPLANT
POSITIONER SURGICAL ARM (MISCELLANEOUS) ×7 IMPLANT
SCREW BONE CORTICAL 5.0X3 (Screw) ×2 IMPLANT
SCREW CANN THRD AFF 10.5X100 (Orthopedic Implant) IMPLANT
SPONGE GAUZE 4X4 12PLY (GAUZE/BANDAGES/DRESSINGS) ×3 IMPLANT
STRIP CLOSURE SKIN 1/2X4 (GAUZE/BANDAGES/DRESSINGS) ×2 IMPLANT
SUT MNCRL AB 4-0 PS2 18 (SUTURE) ×2 IMPLANT
SUT VIC AB 0 CT1 27 (SUTURE) ×3
SUT VIC AB 0 CT1 27XBRD ANTBC (SUTURE) ×1 IMPLANT
SUT VIC AB 2-0 CT1 27 (SUTURE) ×3
SUT VIC AB 2-0 CT1 TAPERPNT 27 (SUTURE) IMPLANT
TOWEL OR 17X26 10 PK STRL BLUE (TOWEL DISPOSABLE) ×6 IMPLANT
TRAY FOLEY CATH 14FRSI W/METER (CATHETERS) IMPLANT

## 2013-12-11 NOTE — H&P (View-Only) (Signed)
Reason for Consult:Left femur fracture Referring Physician: Dr. Buriev  [name] is an 80 y.o. female.  HPI: [name] is a 80 yo female who had a fall at home in her kitchen yesterday when she tripped over the edge of a mat. She landed on her left side with immediate left hip pain and inability to get up. She did not have any head trauma or LOC. She did not have any dizziness, lightheadedness, chest pain, syncope, nausea, vomiting, palpitations or other systemic symptoms associated with the fall. Only complaint this AM is left hip pain. Denies numbness or tingling LLE. Comfortable in bed as long as nobody moves her. I know the patient from performing a left TKA on her several years ago  Past Medical History  Diagnosis Date  . Hypothyroidism   . Psoriatic arthritis   . IBS (irritable bowel syndrome)   . Hemorrhoids   . Diverticulosis   . Rectal polyp   . GERD (gastroesophageal reflux disease)   . Family hx of colon cancer   . Uterine polyp   . Arthritis   . COPD (chronic obstructive pulmonary disease)   . Lymphocytic colitis     Past Surgical History  Procedure Laterality Date  . Knee surgery      lt.  . Uterine polypectomy    . Cholecystectomy  Dec 04, 2010  . Total knee revision  08/06/2012    Procedure: TOTAL KNEE REVISION;  Surgeon: Roshanna Cimino V Khyler Urda, MD;  Location: [facility] ORS;  Service: Orthopedics;  Laterality: Left;  Left Total Knee Arthroplasty Revision    Family History  Problem Relation Age of Onset  . Heart disease Father     pacemaker  . Lung cancer Father   . Colon cancer Mother     Social History:  reports that she quit smoking about 33 years ago. Her smoking use included Cigarettes. She has a 20 pack-year smoking history. She has never used smokeless tobacco. She reports that she drinks alcohol. She reports that she does not use illicit drugs.  Allergies:  Allergies  Allergen Reactions  . Codeine Itching    Medications: I have reviewed the patient's  current medications.  Results for orders placed during the hospital encounter of 12/08/13 (from the past 48 hour(s))  CBC WITH DIFFERENTIAL     Status: Abnormal   Collection Time    12/08/13  5:07 PM      Result Value Ref Range   WBC 19.9 (*) 4.0 - 10.5 K/uL   RBC 4.72  3.87 - 5.11 MIL/uL   Hemoglobin 14.7  12.0 - 15.0 g/dL   HCT 43.1  36.0 - 46.0 %   MCV 91.3  78.0 - 100.0 fL   MCH 31.1  26.0 - 34.0 pg   MCHC 34.1  30.0 - 36.0 g/dL   RDW 13.2  11.5 - 15.5 %   Platelets 203  150 - 400 K/uL   Neutrophils Relative % 90 (*) 43 - 77 %   Neutro Abs 18.0 (*) 1.7 - 7.7 K/uL   Lymphocytes Relative 5 (*) 12 - 46 %   Lymphs Abs 1.0  0.7 - 4.0 K/uL   Monocytes Relative 4  3 - 12 %   Monocytes Absolute 0.8  0.1 - 1.0 K/uL   Eosinophils Relative 0  0 - 5 %   Eosinophils Absolute 0.1  0.0 - 0.7 K/uL   Basophils Relative 0  0 - 1 %   Basophils Absolute 0.0  0.0 -   0.1 K/uL  COMPREHENSIVE METABOLIC PANEL     Status: Abnormal   Collection Time    12/08/13  5:07 PM      Result Value Ref Range   Sodium 140  137 - 147 mEq/L   Potassium 4.5  3.7 - 5.3 mEq/L   Chloride 103  96 - 112 mEq/L   CO2 29  19 - 32 mEq/L   Glucose, Bld 117 (*) 70 - 99 mg/dL   BUN 14  6 - 23 mg/dL   Creatinine, Ser 0.89  0.50 - 1.10 mg/dL   Calcium 9.2  8.4 - 10.5 mg/dL   Total Protein 7.2  6.0 - 8.3 g/dL   Albumin 3.6  3.5 - 5.2 g/dL   AST 18  0 - 37 U/L   ALT 16  0 - 35 U/L   Alkaline Phosphatase 59  39 - 117 U/L   Total Bilirubin 0.4  0.3 - 1.2 mg/dL   GFR calc non Af Amer 60 (*) >90 mL/min   GFR calc Af Amer 70 (*) >90 mL/min   Comment: (NOTE)     The eGFR has been calculated using the CKD EPI equation.     This calculation has not been validated in all clinical situations.     eGFR's persistently <90 mL/min signify possible Chronic Kidney     Disease.  PROTIME-INR     Status: Abnormal   Collection Time    12/08/13  5:07 PM      Result Value Ref Range   Prothrombin Time 23.1 (*) 11.6 - 15.2 seconds   INR  2.12 (*) 0.00 - 1.49  URINALYSIS, ROUTINE W REFLEX MICROSCOPIC     Status: Abnormal   Collection Time    12/08/13  6:45 PM      Result Value Ref Range   Color, Urine YELLOW  YELLOW   APPearance CLEAR  CLEAR   Specific Gravity, Urine 1.020  1.005 - 1.030   pH 7.0  5.0 - 8.0   Glucose, UA NEGATIVE  NEGATIVE mg/dL   Hgb urine dipstick TRACE (*) NEGATIVE   Bilirubin Urine NEGATIVE  NEGATIVE   Ketones, ur NEGATIVE  NEGATIVE mg/dL   Protein, ur NEGATIVE  NEGATIVE mg/dL   Urobilinogen, UA 0.2  0.0 - 1.0 mg/dL   Nitrite NEGATIVE  NEGATIVE   Leukocytes, UA NEGATIVE  NEGATIVE  URINE MICROSCOPIC-ADD ON     Status: Abnormal   Collection Time    12/08/13  6:45 PM      Result Value Ref Range   Squamous Epithelial / LPF FEW (*) RARE   WBC, UA 3-6  <3 WBC/hpf   RBC / HPF 3-6  <3 RBC/hpf   Bacteria, UA FEW (*) RARE  CBC     Status: None   Collection Time    12/09/13  4:52 AM      Result Value Ref Range   WBC 8.1  4.0 - 10.5 K/uL   RBC 4.25  3.87 - 5.11 MIL/uL   Hemoglobin 12.8  12.0 - 15.0 g/dL   HCT 38.9  36.0 - 46.0 %   MCV 91.5  78.0 - 100.0 fL   MCH 30.1  26.0 - 34.0 pg   MCHC 32.9  30.0 - 36.0 g/dL   RDW 13.4  11.5 - 15.5 %   Platelets 166  150 - 400 K/uL    Dg Chest 1 View  12/08/2013   CLINICAL DATA:  Left hip and leg pain.  Fall.  EXAM:   CHEST - 1 VIEW  COMPARISON:  DG CHEST 1V PORT dated 04/10/2013; DG CHEST 2 VIEW dated 07/31/2012; DG HIP COMPLETE*L* dated 12/08/2013  FINDINGS: Atherosclerotic aortic arch. Heart size within normal limits. The lungs appear grossly clear. Bony demineralization noted.  IMPRESSION: 1. No acute thoracic findings. 2. Atherosclerotic aortic arch. 3. Bony demineralization.   Electronically Signed   By: Sherryl Barters M.D.   On: 12/08/2013 18:01   Dg Hip Complete Left  12/08/2013   CLINICAL DATA:  Left hip pain after fall.  EXAM: LEFT HIP - COMPLETE 2+ VIEW  COMPARISON:  None.  FINDINGS: Acute left intertrochanteric fracture. Separate lesser trochanteric  fragment.  Axial loss of articular space in both hips with mild protrusio. Degenerative spurring of both femoral heads.  IMPRESSION: 1. Acute left hip intertrochanteric fracture. Separate lesser trochanteric fragment. 2. Bilateral hip arthropathy with axial loss of articular space and mild protrusio.   Electronically Signed   By: Sherryl Barters M.D.   On: 12/08/2013 18:02    Review of Systems  Constitutional: Negative.   HENT: Negative.   Eyes: Negative.   Respiratory: Negative.   Cardiovascular: Negative.   Gastrointestinal: Negative.   Genitourinary: Negative.   Musculoskeletal: Positive for falls and joint pain.  Skin: Negative.   Neurological: Negative.   Endo/Heme/Allergies: Negative.   Psychiatric/Behavioral: Negative.    Blood pressure 115/47, pulse 85, temperature 98.5 F (36.9 C), temperature source Oral, resp. rate 14, height 5' 5" (1.651 m), weight 178 lb (80.74 kg), SpO2 97.00%. Physical Exam Physical Examination: General appearance - alert, well appearing, and in no distress Mental status - alert, oriented to person, place, and time Chest - clear to auscultation, no wheezes, rales or rhonchi, symmetric air entry Heart - normal rate, regular rhythm, normal S1, S2, no murmurs, rubs, clicks or gallops Abdomen - soft, nontender, nondistended, no masses or organomegaly Neurological - alert, oriented, normal speech, no focal findings or movement disorder noted LLE shortened and externally rotated; motor and sensation intact; toes warm; pulses non-palpable   Assessment/Plan: Left intertrochanteric femur fracture- Plan operative fixation with intramedullary nail. Awaiting INR from today. Probably will not do today as INR last night was >2. I have discussed the procedure risks and potential complications with the patient and she elects to proceed with surgery. Will decide on surgery today vs. Tomorrow later this morning.  [name] 12/09/2013, 7:39 AM

## 2013-12-11 NOTE — Op Note (Signed)
  OPERATIVE REPORT  PREOPERATIVE DIAGNOSIS: Left intertrochanteric femur fracture.   POSTOP DIAGNOSIS: Left intertrochanteric femur fracture.   PROCEDURE: Intramedullary nailing, Left intertrochanteric femur  fracture.  SURGEON: Gaynelle Arabian, M.D.   ASSISTANT: Alexzandrew L. Perkins, P.A.C.   ANESTHESIA:General  Estimated BLOOD LOSS: minimal  DRAINS: None.   COMPLICATIONS:   None  CONDITION: -PACU - hemodynamically stable.    CLINICAL NOTE: [name] is an 80 y.o. female, who had a fall 3 days ago sustaining a displaced  Left intertrochanteric femur fracture. She has been medically cleared and presents for operative fixation.    PROCEDURE IN DETAIL: After successful administration of  General,  the patient was placed on the fracture table with Left lower extremity in a well-padded traction boot,  Right lower extremity in a well-padded leg holder. Under fluoroscopic guidance, the fracture was reduced. The traction was locked in this position. Thigh was prepped  and draped in the usual sterile fashion. The guide pin for the Biomet  Affixus was then passed percutaneously to the tip of the greater  trochanter, was entered into the femoral canal. It was passed into the  canal. The small incision was made and the starter reamer passed over  the guide pin. This was then removed. The nail which was an 11 mm  diameter short trochanteric nail with 130 degrees angle was attached to  the external guide and then passed into the femoral canal, impacted to  the appropriate depth in the canal, then we used the external guide to  place the lag screw. Through the external guide, a guide pin was  passed. Small incision made, and the guide pin was in the center of the  femoral head on the AP and slightly center to posterior on the lateral.  Length was 100 mm. Triple reamer was passed over the guide pin. 100 mm  lag screw was placed. It was then locked down with a locking screw.  Through  the external guide, the distal interlock was placed through the  static hole and this was 34 mm in length with excellent bicortical  purchase. The external guide was then removed. Hardware was in good  position and fracture was well reduced. Wound was copiously irrigated with saline  solution, and  closed deep with interrupted 1 Vicryl, subcu  interrupted 2-0 Vicryl, subcuticular running 4-0 Monocryl. Incision was  cleaned and dried and sterile dressings applied. She was awakened and  transported to recovery in stable condition.   Laura Plover Cailin Gebel, MD    12/11/2013, 4:47 PM

## 2013-12-11 NOTE — Progress Notes (Signed)
Per last night's nurse, Heparin drip was stopped this morning at 0600.

## 2013-12-11 NOTE — Anesthesia Postprocedure Evaluation (Signed)
Anesthesia Post Note  Patient: [name]  Procedure(s) Performed: Procedure(s) (LRB): INTRAMEDULLARY (IM) NAIL FEMORAL (Left)  Anesthesia type: General  Patient location: PACU  Post pain: Pain level controlled  Post assessment: Post-op Vital signs reviewed  Last Vitals:  Filed Vitals:   12/11/13 1727  BP:   Pulse: 90  Temp:   Resp: 15    Post vital signs: Reviewed  Level of consciousness: sedated  Complications: No apparent anesthesia complications

## 2013-12-11 NOTE — Preoperative (Signed)
Beta Blockers   Reason not to administer Beta Blockers:Not Applicable 

## 2013-12-11 NOTE — Anesthesia Preprocedure Evaluation (Addendum)
Anesthesia Evaluation  Patient identified by MRN, date of birth, ID band Patient awake    Reviewed: Allergy & Precautions, H&P , NPO status , Patient's Chart, lab work & pertinent test results  Airway Mallampati: II TM Distance: >3 FB Neck ROM: Full    Dental  (+) Dental Advisory Given, Teeth Intact   Pulmonary COPDformer smoker,  breath sounds clear to auscultation  Pulmonary exam normal       Cardiovascular + Peripheral Vascular Disease - CAD + dysrhythmias Atrial Fibrillation Rhythm:Regular Rate:Normal     Neuro/Psych negative neurological ROS  negative psych ROS   GI/Hepatic Neg liver ROS, GERD-  Medicated,  Endo/Other  Hypothyroidism   Renal/GU negative Renal ROS     Musculoskeletal  (+) Arthritis -, Osteoarthritis,    Abdominal   Peds  Hematology negative hematology ROS (+) anemia ,   Anesthesia Other Findings   Reproductive/Obstetrics                         Anesthesia Physical Anesthesia Plan  ASA: III  Anesthesia Plan: General   Post-op Pain Management:    Induction: Intravenous  Airway Management Planned: Oral ETT  Additional Equipment:   Intra-op Plan:   Post-operative Plan: Extubation in OR  Informed Consent: I have reviewed the patients History and Physical, chart, labs and discussed the procedure including the risks, benefits and alternatives for the proposed anesthesia with the patient or authorized representative who has indicated his/her understanding and acceptance.   Dental advisory given  Plan Discussed with: CRNA  Anesthesia Plan Comments:         Anesthesia Quick Evaluation

## 2013-12-11 NOTE — Interval H&P Note (Signed)
History and Physical Interval Note:  12/11/2013 3:43 PM  [name]  has presented today for surgery, with the diagnosis of left trochantaric fracture  The various methods of treatment have been discussed with the patient and family. After consideration of risks, benefits and other options for treatment, the patient has consented to  Procedure(s): INTRAMEDULLARY (IM) NAIL FEMORAL (Left) as a surgical intervention .  The patient's history has been reviewed, patient examined, no change in status, stable for surgery.  I have reviewed the patient's chart and labs.  Questions were answered to the patient's satisfaction.     [name]

## 2013-12-11 NOTE — Transfer of Care (Signed)
Immediate Anesthesia Transfer of Care Note  Patient: [name]  Procedure(s) Performed: Procedure(s): INTRAMEDULLARY (IM) NAIL FEMORAL (Left)  Patient Location: PACU  Anesthesia Type:General  Level of Consciousness: awake, alert  and oriented  Airway & Oxygen Therapy: Patient Spontanous Breathing and Patient connected to face mask oxygen  Post-op Assessment: Report given to PACU RN, Post -op Vital signs reviewed and stable and Patient moving all extremities  Post vital signs: Reviewed and stable  Complications: No apparent anesthesia complications

## 2013-12-11 NOTE — Progress Notes (Signed)
[place] for Warfarin Indication: atrial fibrillation   Allergies  Allergen Reactions  . Codeine Itching    Patient Measurements: Height: 5\' 5"  (165.1 cm) Weight: 178 lb (80.74 kg) IBW/kg (Calculated) : 57 Heparin Dosing Weight: 75 kg  Vital Signs: Temp: 98 F (36.7 C) (03/06 1828) Temp src: Oral (03/06 1343) BP: 97/63 mmHg (03/06 1828) Pulse Rate: 92 (03/06 1828)  Labs:  Recent Labs  12/09/13 0452 12/09/13 0830 12/10/13 0425 12/10/13 2001 12/11/13 0407  HGB 12.8  --  12.1  --  11.6*  HCT 38.9  --  37.6  --  34.9*  PLT 166  --  149*  --  143*  LABPROT  --  23.5* 18.3*  --  16.2*  INR  --  2.17* 1.57*  --  1.33  HEPARINUNFRC  --   --   --  0.21*  --   CREATININE  --   --  0.75  --  0.69    Estimated Creatinine Clearance: 60.8 ml/min (by C-G formula based on Cr of 0.69).   Medical History: Past Medical History  Diagnosis Date  . Hypothyroidism   . Psoriatic arthritis   . IBS (irritable bowel syndrome)   . Hemorrhoids   . Diverticulosis   . Rectal polyp   . GERD (gastroesophageal reflux disease)   . Family hx of colon cancer   . Uterine polyp   . Arthritis   . COPD (chronic obstructive pulmonary disease)   . Lymphocytic colitis     Medications:  Scheduled:  .  ceFAZolin (ANCEF) IV  2 g Intravenous Q6H  . cefTRIAXone (ROCEPHIN)  IV  1 g Intravenous Q24H  . diltiazem  120 mg Oral Daily  . docusate sodium  100 mg Oral BID  . [START ON 12/12/2013] enoxaparin (LOVENOX) injection  40 mg Subcutaneous Q24H  . fluticasone  1 spray Each Nare Daily  . HYDROmorphone      . HYDROmorphone      . ipratropium  500 mcg Nebulization QID  . levothyroxine  125 mcg Oral QAC breakfast  . sodium chloride  3 mL Intravenous Q12H   Infusions:  . sodium chloride      Assessment: 66 yoF admitted 3/3 after falling and found to have L hip fracture.  PMH includes Afib on chronic anticoagulation with warfarin.  Dose was 5mg  daily except  7.5 mg Tue/Thur with last dose taken on 3/2.  INR on admission was therapeutic at 2.12, but warfarin was held for planned surgical intervention.  Pharmacy consulted for Heparin bridge to surgery.  Now, s/p femur fracture repair and MD resuming warfarin tonight per pharmacy consult with prophylactic Lovenox 40 mg SQ daily starting AM of POD1, until INR >=1.8.  INR 1.33, last warfarin dose 3/2  CBC: Hgb 11.6 and Plt 143 today, pre-op  SCr 0.69 with CrCl ~ 60 ml/min  No drug interactions noted.  Goal of Therapy:  INR 2-3 Monitor platelets by anticoagulation protocol: Yes   Plan:  1.  Warfarin 7.5 mg PO tonight. 2.  Daily INR.  Hershal Coria, PharmD, BCPS Pager: [phone] 12/11/2013 7:13 PM

## 2013-12-11 NOTE — Progress Notes (Signed)
Patient ID: [name], female   DOB: [birth year], 80 y.o.   MRN: [id number]  [facility] PROGRESS NOTE  [name] INO:[id number] DOB: [birth year] DOA: 12/08/2013 PCP: Asencion Noble, MD  Brief narrative:  80 year old female with past medical history of COPD, atrial fibrillation on AC, hypothyroidism, left TKA, IBS who presented to [facility] ED 12/08/2013 status post mechanical fall and subsequent left hip fracture. In ED, vitals were stable. Blood work revealed WBC count of 19.9 and INR of 2.12. Her urinalysis was unremarkable and CXR did not reveal acute cardiopulmonary findings. She was found to have left intertrochanteric fracture with plan for surgery tomorrow 12/10/2013. She was additionally started on empiric rocephin for possible UTI.   Assessment and Plan:  Principal Problem:  Fracture of hip, left, closed  - management per orhtopedic surgery, plan for surgery tomorrow afternoon  - continue pain management with dilaudid 1 mg every 3 hours IV PRN severe pain and percocet PO PRN moderate pain  - plan for hip repair this afternoon  Active Problems:  Leukocytosis  - secondary to principal problem  - no signs of an infectious etiology  - now resolved  HYPOTHYROIDISM  - continue levothyroxine  COPD  - stable  - may continue atrovent nebulizer QID and as needed every 2 hours  Atrial fibrillation  - coumadin on hold in anticipation of surgery in am  - rate controlled with cardizem  UTI - on empiric rocephin  - urine culture with no growth   Consultants:  Orthopedic surgery  Procedures/Studies:  Dg Chest 1 View 12/08/2013 No acute thoracic findings. Atherosclerotic aortic arch. Bony demineralization.  Dg Hip Complete Left 12/08/2013 Acute left hip intertrochanteric fracture. Separate lesser trochanteric fragment. Bilateral hip arthropathy with axial loss of articular space. Antibiotics:  Rocephin 12/08/2013 -->  Code Status: Full  Family Communication: Pt at bedside  Disposition  Plan: Remains inpatient   HPI/Subjective: No events overnight.   Objective: Filed Vitals:   12/10/13 2135 12/11/13 0526 12/11/13 0825 12/11/13 1343  BP: 118/51 119/50  125/47  Pulse: 81 79  62  Temp: 98.5 F (36.9 C) 98.4 F (36.9 C)  98.2 F (36.8 C)  TempSrc: Oral Oral  Oral  Resp: 18 19    Height:      Weight:      SpO2: 96% 95% 98% 94%    Intake/Output Summary (Last 24 hours) at 12/11/13 1413 Last data filed at 12/11/13 1153  Gross per 24 hour  Intake 982.88 ml  Output   1550 ml  Net -567.12 ml    Exam:   General:  Pt is alert, follows commands appropriately, not in acute distress  Cardiovascular: Regular rate and rhythm, S1/S2, no murmurs, no rubs, no gallops  Respiratory: Clear to auscultation bilaterally, no wheezing, no crackles, no rhonchi  Abdomen: Soft, non tender, non distended, bowel sounds present, no guarding  Extremities: No edema, pulses DP and PT palpable bilaterally  Neuro: Grossly nonfocal  Data Reviewed: Basic Metabolic Panel:  Recent Labs Lab 12/08/13 1707 12/10/13 0425 12/11/13 0407  NA 140 134* 135*  K 4.5 4.4 4.5  CL 103 98 97  CO2 29 27 29   GLUCOSE 117* 121* 121*  BUN 14 13 9   CREATININE 0.89 0.75 0.69  CALCIUM 9.2 8.2* 8.1*   Liver Function Tests:  Recent Labs Lab 12/08/13 1707  AST 18  ALT 16  ALKPHOS 59  BILITOT 0.4  PROT 7.2  ALBUMIN 3.6   CBC:  Recent Labs Lab  12/08/13 1707 12/09/13 0452 12/10/13 0425 12/11/13 0407  WBC 19.9* 8.1 8.2 7.8  NEUTROABS 18.0*  --   --   --   HGB 14.7 12.8 12.1 11.6*  HCT 43.1 38.9 37.6 34.9*  MCV 91.3 91.5 91.7 90.6  PLT 203 166 149* 143*   Recent Results (from the past 240 hour(s))  URINE CULTURE     Status: None   Collection Time    12/08/13  9:58 PM      Result Value Ref Range Status   Specimen Description URINE, CATHETERIZED   Final   Special Requests NONE   Final   Culture  Setup Time     Final   Value: 12/09/2013 01:14     Performed at [facility]     Final   Value: NO GROWTH     Performed at [facility]   Culture     Final   Value: NO GROWTH     Performed at [facility]   Report Status 12/09/2013 FINAL   Final  SURGICAL PCR SCREEN     Status: None   Collection Time    12/10/13  5:15 AM      Result Value Ref Range Status   MRSA, PCR NEGATIVE  NEGATIVE Final   Staphylococcus aureus NEGATIVE  NEGATIVE Final   Comment:            The Xpert SA Assay (FDA     approved for NASAL specimens     in patients over 31 years of age),     is one component of     a comprehensive surveillance     program.  Test performance has     been validated by [employer] for patients greater     than or equal to 11 year old.     It is not intended     to diagnose infection nor to     guide or monitor treatment.     Scheduled Meds: . cefTRIAXone (ROCEPHIN)  IV  1 g Intravenous Q24H  . diltiazem  120 mg Oral Daily  . fluticasone  1 spray Each Nare Daily  . ipratropium  500 mcg Nebulization QID  . levothyroxine  125 mcg Oral QAC breakfast  . sodium chloride  3 mL Intravenous Q12H   Continuous Infusions:  Faye Ramsay, MD  [facility] Pager [phone]  If 7PM-7AM, please contact night-coverage [email or web address] Password [email or web address] 12/11/2013, 2:13 PM   LOS: 3 days

## 2013-12-12 LAB — CBC
HCT: 32.7 % — ABNORMAL LOW (ref 36.0–46.0)
Hemoglobin: 11 g/dL — ABNORMAL LOW (ref 12.0–15.0)
MCH: 30.4 pg (ref 26.0–34.0)
MCHC: 33.6 g/dL (ref 30.0–36.0)
MCV: 90.3 fL (ref 78.0–100.0)
PLATELETS: 140 10*3/uL — AB (ref 150–400)
RBC: 3.62 MIL/uL — AB (ref 3.87–5.11)
RDW: 12.8 % (ref 11.5–15.5)
WBC: 6.4 10*3/uL (ref 4.0–10.5)

## 2013-12-12 LAB — PROTIME-INR
INR: 1.21 (ref 0.00–1.49)
Prothrombin Time: 15 seconds (ref 11.6–15.2)

## 2013-12-12 LAB — BASIC METABOLIC PANEL
BUN: 8 mg/dL (ref 6–23)
CALCIUM: 8 mg/dL — AB (ref 8.4–10.5)
CHLORIDE: 97 meq/L (ref 96–112)
CO2: 30 mEq/L (ref 19–32)
CREATININE: 0.7 mg/dL (ref 0.50–1.10)
GFR calc Af Amer: >90 mL/min (ref >90)
GFR calc non Af Amer: 81 mL/min — ABNORMAL LOW (ref >90)
Glucose, Bld: 98 mg/dL (ref 70–99)
Potassium: 4.4 mEq/L (ref 3.7–5.3)
Sodium: 133 mEq/L — ABNORMAL LOW (ref 137–147)

## 2013-12-12 MED ORDER — WARFARIN SODIUM 7.5 MG PO TABS
7.5000 mg | ORAL_TABLET | Freq: Once | ORAL | Status: AC
  Administered 2013-12-12: 7.5 mg via ORAL
  Filled 2013-12-12: qty 1

## 2013-12-12 NOTE — Progress Notes (Signed)
Patient ID: [name], female   DOB: [birth year], 80 y.o.   MRN: [id number]  [facility] PROGRESS NOTE  Karagan Lehr Kalina RKY:[id number] DOB: 1933/05/24 DOA: 12/08/2013 PCP: Asencion Noble, MD   Brief narrative:  80 year old female with past medical history of COPD, atrial fibrillation on AC, hypothyroidism, left TKA, IBS who presented to [facility] ED 12/08/2013 status post mechanical fall and subsequent left hip fracture. In ED, vitals were stable. Blood work revealed WBC count of 19.9 and INR of 2.12. Her urinalysis was unremarkable and CXR did not reveal acute cardiopulmonary findings. She was found to have left intertrochanteric fracture with plan for surgery tomorrow 12/10/2013. She was additionally started on empiric rocephin for possible UTI.   Assessment and Plan:  Principal Problem:  Fracture of hip, left, closed  - s/p intramedullary nailing, Left intertrochanteric femur fracture, post op day #1 - pt is clinically stable and doing well this AM  - continue pain management with dilaudid 1 mg every 3 hours IV PRN severe pain and percocet PO PRN moderate pain  Active Problems:  Leukocytosis  - secondary to principal problem  - no signs of an infectious etiology  HYPOTHYROIDISM  - continue levothyroxine  COPD  - stable  - may continue atrovent nebulizer QID and as needed every 2 hours  Atrial fibrillation  - coumadin on hold in anticipation of surgery in am  - rate controlled with cardizem  UTI - on empiric rocephin  - urine culture with no growth   Consultants:  Orthopedic surgery  Procedures/Studies:  Dg Chest 1 View 12/08/2013 No acute thoracic findings. Atherosclerotic aortic arch. Bony demineralization.  Dg Hip Complete Left 12/08/2013 Acute left hip intertrochanteric fracture. Separate lesser trochanteric fragment. Bilateral hip arthropathy with axial loss of articular space. Antibiotics:  Rocephin 12/08/2013 -->  Code Status: Full  Family Communication: Pt at bedside   Disposition Plan: Remains inpatient, SNF Monday    HPI/Subjective: No events overnight.   Objective: Filed Vitals:   12/12/13 0535 12/12/13 0813 12/12/13 1158 12/12/13 1413  BP: 108/46   118/56  Pulse: 79   90  Temp: 97.9 F (36.6 C)   98.3 F (36.8 C)  TempSrc: Oral   Oral  Resp: 18   18  Height:      Weight:      SpO2: 96% 88% 86% 87%    Intake/Output Summary (Last 24 hours) at 12/12/13 1514 Last data filed at 12/12/13 1300  Gross per 24 hour  Intake 2848.75 ml  Output   1850 ml  Net 998.75 ml    Exam:   General:  Pt is alert, follows commands appropriately, not in acute distress  Cardiovascular: Regular rate and rhythm, S1/S2, no murmurs, no rubs, no gallops  Respiratory: Clear to auscultation bilaterally, no wheezing, no crackles, no rhonchi  Abdomen: Soft, non tender, non distended, bowel sounds present, no guarding  Extremities: No edema, pulses DP and PT palpable bilaterally  Neuro: Grossly nonfocal  Data Reviewed: Basic Metabolic Panel:  Recent Labs Lab 12/08/13 1707 12/10/13 0425 12/11/13 0407 12/12/13 0450  NA 140 134* 135* 133*  K 4.5 4.4 4.5 4.4  CL 103 98 97 97  CO2 29 27 29 30   GLUCOSE 117* 121* 121* 98  BUN 14 13 9 8   CREATININE 0.89 0.75 0.69 0.70  CALCIUM 9.2 8.2* 8.1* 8.0*   Liver Function Tests:  Recent Labs Lab 12/08/13 1707  AST 18  ALT 16  ALKPHOS 59  BILITOT 0.4  PROT  7.2  ALBUMIN 3.6   No results found for this basename: LIPASE, AMYLASE,  in the last 168 hours No results found for this basename: AMMONIA,  in the last 168 hours CBC:  Recent Labs Lab 12/08/13 1707 12/09/13 0452 12/10/13 0425 12/11/13 0407 12/12/13 0450  WBC 19.9* 8.1 8.2 7.8 6.4  NEUTROABS 18.0*  --   --   --   --   HGB 14.7 12.8 12.1 11.6* 11.0*  HCT 43.1 38.9 37.6 34.9* 32.7*  MCV 91.3 91.5 91.7 90.6 90.3  PLT 203 166 149* 143* 140*   Recent Results (from the past 240 hour(s))  URINE CULTURE     Status: None   Collection Time     12/08/13  9:58 PM      Result Value Ref Range Status   Specimen Description URINE, CATHETERIZED   Final   Special Requests NONE   Final   Culture  Setup Time     Final   Value: 12/09/2013 01:14     Performed at [facility]     Final   Value: NO GROWTH     Performed at [facility]   Culture     Final   Value: NO GROWTH     Performed at [facility]   Report Status 12/09/2013 FINAL   Final  SURGICAL PCR SCREEN     Status: None   Collection Time    12/10/13  5:15 AM      Result Value Ref Range Status   MRSA, PCR NEGATIVE  NEGATIVE Final   Staphylococcus aureus NEGATIVE  NEGATIVE Final   Comment:            The Xpert SA Assay (FDA     approved for NASAL specimens     in patients over 65 years of age),     is one component of     a comprehensive surveillance     program.  Test performance has     been validated by [employer] for patients greater     than or equal to 82 year old.     It is not intended     to diagnose infection nor to     guide or monitor treatment.     Scheduled Meds: . cefTRIAXone (ROCEPHIN)  IV  1 g Intravenous Q24H  . diltiazem  120 mg Oral Daily  . docusate sodium  100 mg Oral BID  . enoxaparin (LOVENOX) injection  40 mg Subcutaneous Q24H  . fluticasone  1 spray Each Nare Daily  . ipratropium  500 mcg Nebulization QID  . levothyroxine  125 mcg Oral QAC breakfast  . sodium chloride  3 mL Intravenous Q12H  . warfarin  7.5 mg Oral ONCE-1800  . Warfarin - Pharmacist Dosing Inpatient   Does not apply q1800   Continuous Infusions: . sodium chloride 75 mL/hr at 12/12/13 1036    Faye Ramsay, MD  [place] Pager [phone]  If 7PM-7AM, please contact night-coverage [email or web address] Password [email or web address] 12/12/2013, 3:14 PM   LOS: 4 days

## 2013-12-12 NOTE — Progress Notes (Signed)
OT Cancellation Note  Patient Details Name: [name] MRN: [id number] DOB: May 29, 1933   Cancelled Treatment:    Reason Eval/Treat Not Completed: Other (comment).  Pt is planning snf for rehab.  Will defer OT eval to that venue as it is likely not needed for admission.    Larz Mark 12/12/2013, 10:47 AM Lesle Chris, OTR/L [phone] 12/12/2013

## 2013-12-12 NOTE — Progress Notes (Signed)
UR completed 

## 2013-12-12 NOTE — Progress Notes (Signed)
ANTICOAGULATION CONSULT NOTE - Follow Up  Pharmacy Consult for Warfarin Indication: atrial fibrillation   Allergies  Allergen Reactions  . Codeine Itching    Patient Measurements: Height: 5\' 5"  (165.1 cm) Weight: 178 lb (80.74 kg) IBW/kg (Calculated) : 57 Heparin Dosing Weight: 75 kg  Vital Signs: Temp: 97.9 F (36.6 C) (03/07 0535) Temp src: Oral (03/07 0535) BP: 108/46 mmHg (03/07 0535) Pulse Rate: 79 (03/07 0535)  Labs:  Recent Labs  12/10/13 0425 12/10/13 2001 12/11/13 0407 12/12/13 0450 12/12/13 0825  HGB 12.1  --  11.6* 11.0*  --   HCT 37.6  --  34.9* 32.7*  --   PLT 149*  --  143* 140*  --   LABPROT 18.3*  --  16.2*  --  15.0  INR 1.57*  --  1.33  --  1.21  HEPARINUNFRC  --  0.21*  --   --   --   CREATININE 0.75  --  0.69 0.70  --     Estimated Creatinine Clearance: 60.8 ml/min (by C-G formula based on Cr of 0.7).   Medications:  Scheduled:  . cefTRIAXone (ROCEPHIN)  IV  1 g Intravenous Q24H  . diltiazem  120 mg Oral Daily  . docusate sodium  100 mg Oral BID  . enoxaparin (LOVENOX) injection  40 mg Subcutaneous Q24H  . fluticasone  1 spray Each Nare Daily  . ipratropium  500 mcg Nebulization QID  . levothyroxine  125 mcg Oral QAC breakfast  . sodium chloride  3 mL Intravenous Q12H  . Warfarin - Pharmacist Dosing Inpatient   Does not apply q1800   Infusions:  . sodium chloride 75 mL/hr at 12/11/13 1957    Assessment: 4 yoF admitted 3/3 after falling and found to have L hip fracture.  PMH includes Afib on chronic anticoagulation with warfarin.  Dose was 5mg  daily except 7.5 mg Tue/Thur with last dose taken on 3/2.  INR on admission was therapeutic at 2.12, but warfarin was held for planned surgical intervention.  Pharmacy consulted for Heparin bridge to surgery.  Now, s/p femur fracture repair and MD resuming warfarin tonight per pharmacy consult with prophylactic Lovenox 40 mg SQ daily starting AM of POD1, until INR >=1.8.  INR suptherapeutic as  expected, warfarin 7.5mg  given last night  CBC stable, no bleeding/complications reported.  No drug interactions noted.  Goal of Therapy:  INR 2-3 Monitor platelets by anticoagulation protocol: Yes   Plan:  1.  Repeat Warfarin 7.5 mg PO tonight. 2.  Daily INR. 3.  Continue Lovenox per MD until INR > or = 1.8  Peggyann Juba, PharmD, BCPS Pager: [phone]  12/12/2013 9:31 AM

## 2013-12-12 NOTE — Evaluation (Signed)
Physical Therapy Evaluation Patient Details Name: [name] MRN: [id number] DOB: 1933-05-27 Today's Date: 12/12/2013 Time: 6301-6010 PT Time Calculation (min): 32 min  PT Assessment / Plan / Recommendation History of Present Illness  S/p L hip IM nail after fall on 12/08/13, surgery performed 3/6.  Clinical Impression  Pt became nauseated and dizzy  When sitting. Pt will benefit from PT to address problems. Pt plans  For SNF.    PT Assessment  Patient needs continued PT services    Follow Up Recommendations  SNF    Does the patient have the potential to tolerate intense rehabilitation      Barriers to Discharge        Equipment Recommendations  None recommended by PT    Recommendations for Other Services     Frequency Min 3X/week    Precautions / Restrictions Precautions Precautions: Fall Restrictions Weight Bearing Restrictions: No LLE Weight Bearing: Weight bearing as tolerated   Pertinent Vitals/Pain BP while sitting 124/52 sitting.      Mobility  Bed Mobility Overal bed mobility: Needs Assistance;+2 for physical assistance;+ 2 for safety/equipment Bed Mobility: Supine to Sit;Sit to Supine Supine to sit: Max assist;+2 for physical assistance;+2 for safety/equipment;HOB elevated Sit to supine: Max assist;+2 for physical assistance;+2 for safety/equipment General bed mobility comments: use of bed pad to slide pt in/out of bed. Transfers Overall transfer level: Needs assistance Equipment used: Rolling walker (2 wheeled) Transfers: Sit to/from Stand Sit to Stand: +2 physical assistance;+2 safety/equipment;From elevated surface;Max assist General transfer comment: pt had difficulty getting up from bed due to L leg pain    Exercises     PT Diagnosis: Difficulty walking;Acute pain  PT Problem List: Decreased strength;Decreased activity tolerance;Decreased knowledge of precautions;Decreased safety awareness;Decreased knowledge of use of DME;Decreased range of  motion;Pain PT Treatment Interventions: DME instruction;Gait training;Functional mobility training;Therapeutic activities;Therapeutic exercise;Patient/family education     PT Goals(Current goals can be found in the care plan section) Acute Rehab PT Goals Patient Stated Goal: I want  to try to walk. PT Goal Formulation: With patient Time For Goal Achievement: 12/19/13 Potential to Achieve Goals: Good  Visit Information  Last PT Received On: 12/12/13 Assistance Needed: +2 History of Present Illness: S/p L hip IM nail after fall on 12/08/13, surgery performed 3/6.       Prior Functioning  Home Living Family/patient expects to be discharged to:: [place]: Alone    Cognition  Cognition Arousal/Alertness: Awake/alert Behavior During Therapy: WFL for tasks assessed/performed Overall Cognitive Status: Within Functional Limits for tasks assessed    Extremity/Trunk Assessment Upper Extremity Assessment Upper Extremity Assessment: Defer to OT evaluation Lower Extremity Assessment Lower Extremity Assessment: LLE deficits/detail LLE Deficits / Details: requires assist to move leg  to edge and back to bed   Balance    End of Session PT - End of Session Activity Tolerance: Patient limited by pain Patient left: in bed;with call bell/phone within reach Nurse Communication: Mobility status  GP     Claretha Cooper 12/12/2013, 5:00 PM

## 2013-12-12 NOTE — Progress Notes (Signed)
Pt and family DO NOT want pt to have Foley catheter removed today.  PT came to work with pt this afternoon and she could not ambulate to chair. PT stated they would return in morning to try to get pt up then.  Pt and family would like for Foley to stay in until she feels comfortable transferring to BSC/chair. Will leave Foley in until morning of 12/13/13.

## 2013-12-12 NOTE — Progress Notes (Signed)
Subjective: 1 Day Post-Op Procedure(s) (LRB): INTRAMEDULLARY (IM) NAIL FEMORAL (Left) Patient doing well this AM. Pain well controlled, sitting up in bed eating.  Pt denies N/V/F/C, chest pain, SOB, calf pain, or paresthesia b/l.  Objective: Vital signs in last 24 hours: Temp:  [97.9 F (36.6 C)-99.1 F (37.3 C)] 97.9 F (36.6 C) (03/07 0535) Pulse Rate:  [62-99] 79 (03/07 0535) Resp:  [12-18] 18 (03/07 0535) BP: (97-137)/(44-84) 108/46 mmHg (03/07 0535) SpO2:  [88 %-100 %] 88 % (03/07 0813)  Intake/Output from previous day: 03/06 0701 - 03/07 0700 In: 2608.8 [I.V.:2403.8; IV Piggyback:205] Out: 2150 [Urine:2100; Blood:50] Intake/Output this shift: Total I/O In: 120 [P.O.:120] Out: -    Recent Labs  12/10/13 0425 12/11/13 0407 12/12/13 0450  HGB 12.1 11.6* 11.0*    Recent Labs  12/11/13 0407 12/12/13 0450  WBC 7.8 6.4  RBC 3.85* 3.62*  HCT 34.9* 32.7*  PLT 143* 140*    Recent Labs  12/11/13 0407 12/12/13 0450  NA 135* 133*  K 4.5 4.4  CL 97 97  CO2 29 30  BUN 9 8  CREATININE 0.69 0.70  GLUCOSE 121* 98  CALCIUM 8.1* 8.0*    Recent Labs  12/11/13 0407 12/12/13 0825  INR 1.33 1.21    WD WN female in NAD, A/Ox3, appears stated age.  EOMI, mood and affect normal, respirations unlabored.  On physical exam dressing is C/D/I, in proper placement and good repair.  Compartment soft, negative Homan's sign.  Lower extremities are NVI with good motor control of plantar/dorsiflexion of the great toes and feet b/l.  DP pulses 2+ b/l.   Assessment/Plan: 1 Day Post-Op Procedure(s) (LRB): INTRAMEDULLARY (IM) NAIL FEMORAL (Left) Up with therapy  FLOWERS, CHRISTOPHER S 12/12/2013, 9:20 AM

## 2013-12-13 LAB — BASIC METABOLIC PANEL
BUN: 8 mg/dL (ref 6–23)
CO2: 26 mEq/L (ref 19–32)
CREATININE: 0.62 mg/dL (ref 0.50–1.10)
Calcium: 8.1 mg/dL — ABNORMAL LOW (ref 8.4–10.5)
Chloride: 96 mEq/L (ref 96–112)
GFR calc non Af Amer: 84 mL/min — ABNORMAL LOW (ref >90)
Glucose, Bld: 117 mg/dL — ABNORMAL HIGH (ref 70–99)
Potassium: 4.1 mEq/L (ref 3.7–5.3)
Sodium: 133 mEq/L — ABNORMAL LOW (ref 137–147)

## 2013-12-13 LAB — CBC
HEMATOCRIT: 35 % — AB (ref 36.0–46.0)
Hemoglobin: 11.8 g/dL — ABNORMAL LOW (ref 12.0–15.0)
MCH: 30.5 pg (ref 26.0–34.0)
MCHC: 33.7 g/dL (ref 30.0–36.0)
MCV: 90.4 fL (ref 78.0–100.0)
Platelets: 148 10*3/uL — ABNORMAL LOW (ref 150–400)
RBC: 3.87 MIL/uL (ref 3.87–5.11)
RDW: 12.8 % (ref 11.5–15.5)
WBC: 8.7 10*3/uL (ref 4.0–10.5)

## 2013-12-13 LAB — PROTIME-INR
INR: 1.38 (ref 0.00–1.49)
Prothrombin Time: 16.6 seconds — ABNORMAL HIGH (ref 11.6–15.2)

## 2013-12-13 MED ORDER — OXYCODONE-ACETAMINOPHEN 5-325 MG PO TABS: 1.0000 | ORAL_TABLET | ORAL | Status: DC | PRN

## 2013-12-13 MED ORDER — ALPRAZOLAM 0.5 MG PO TABS: 0.2500 mg | ORAL_TABLET | Freq: Every evening | ORAL | Status: DC | PRN

## 2013-12-13 MED ORDER — SENNOSIDES-DOCUSATE SODIUM 8.6-50 MG PO TABS
1.0000 | ORAL_TABLET | Freq: Two times a day (BID) | ORAL | Status: DC
  Administered 2013-12-13 – 2013-12-14 (×3): 1 via ORAL
  Filled 2013-12-13 (×4): qty 1

## 2013-12-13 MED ORDER — WARFARIN SODIUM 7.5 MG PO TABS
7.5000 mg | ORAL_TABLET | Freq: Once | ORAL | Status: AC
  Administered 2013-12-13: 7.5 mg via ORAL
  Filled 2013-12-13: qty 1

## 2013-12-13 MED ORDER — WARFARIN SODIUM 5 MG PO TABS: 5.0000 mg | ORAL_TABLET | Freq: Every day | ORAL | Status: DC

## 2013-12-13 MED ORDER — TRAMADOL HCL 50 MG PO TABS: 50.0000 mg | ORAL_TABLET | Freq: Three times a day (TID) | ORAL | Status: DC | PRN

## 2013-12-13 NOTE — Progress Notes (Signed)
   Subjective: 2 Days Post-Op Procedure(s) (LRB): INTRAMEDULLARY (IM) NAIL FEMORAL (Left)  Pt c/o moderate groin pain today Denies any numbness or tingling distally Otherwise doing okay Patient reports pain as moderate.  Objective:   VITALS:   Filed Vitals:   12/13/13 0709  BP: 118/45  Pulse: 94  Temp: 99 F (37.2 C)  Resp: 18    Left hip incisions healing well nv intact distally No rashes or edema  LABS  Recent Labs  12/11/13 0407 12/12/13 0450 12/13/13 0548  HGB 11.6* 11.0* 11.8*  HCT 34.9* 32.7* 35.0*  WBC 7.8 6.4 8.7  PLT 143* 140* 148*     Recent Labs  12/11/13 0407 12/12/13 0450 12/13/13 0548  NA 135* 133* 133*  K 4.5 4.4 4.1  BUN 9 8 8   CREATININE 0.69 0.70 0.62  GLUCOSE 121* 98 117*     Assessment/Plan: 2 Days Post-Op Procedure(s) (LRB): INTRAMEDULLARY (IM) NAIL FEMORAL (Left)   PT/OT Pain control Will continue to monitor her groin pain   Brad Brian Kocourek, MPAS, PA-C  12/13/2013, 8:40 AM

## 2013-12-13 NOTE — Progress Notes (Signed)
Writer and pt were told by PT 12/12/2013 early evening that PT would return this morning and get pt to chair, as yesterday evening pt stated that getting up was "unbearable".  At that time (this morning), foley catheter would be removed, as pt does not want to use the bedpan and is worried that her groin pain would be far too painful on a bedpan.  Writer has paged PT X 2 this afternoon, as they have yet to assess pt.  A few moments ago, Probation officer was told by NT that PT was on the floor, and told NT that they would return in morning 12/15/13 to assess pt. Pt and family very upset.

## 2013-12-13 NOTE — Discharge Summary (Signed)
Physician Discharge Summary  [name] [id number] DOB: Nov 09, 1932 DOA: 12/08/2013  PCP: Asencion Noble, MD  Admit date: 12/08/2013 Discharge date: 12/14/2013  Recommendations for Outpatient Follow-up:  1. Pt will need to follow up with PCP in 2-3 weeks post discharge 2. Please obtain BMP to evaluate electrolytes and kidney function 3. Please also check CBC to evaluate Hg and Hct levels  Discharge Diagnoses:   Fracture of hip, left, closed Principal Problem:   Fracture of hip, left, closed Active Problems:   HYPOTHYROIDISM   COPD   COLITIS   ARTHRITIS   Atrial fibrillation   Hip fracture  Discharge Condition: Stable  Diet recommendation: Heart healthy diet discussed in details   Brief narrative:  80 year old female with past medical history of COPD, atrial fibrillation on AC, hypothyroidism, left TKA, IBS who presented to [facility] ED 12/08/2013 status post mechanical fall and subsequent left hip fracture. In ED, vitals were stable. Blood work revealed WBC count of 19.9 and INR of 2.12. Her urinalysis was unremarkable and CXR did not reveal acute cardiopulmonary findings. She was found to have left intertrochanteric fracture with plan for surgery tomorrow 12/10/2013. She was additionally started on empiric rocephin for possible UTI.   Assessment and Plan:  Principal Problem:  Fracture of hip, left, closed  - s/p intramedullary nailing, Left intertrochanteric femur fracture, post op day #2 - pt is clinically stable and doing well this AM but has some groin pain  - continue pain management with dilaudid 1 mg every 3 hours IV PRN severe pain and percocet PO PRN moderate pain  Active Problems:  Leukocytosis  - secondary to principal problem  - no signs of an infectious etiology  - WBC is now WNL HYPOTHYROIDISM  - continue levothyroxine  COPD  - stable  - may continue atrovent nebulizer QID and as needed every 2 hours  Atrial fibrillation  - coumadin upon discharge  - rate controlled  with cardizem  UTI - on empiric rocephin  - urine culture with no growth  - stop date 3/9   Consultants:  Orthopedic surgery  Procedures/Studies:  Dg Chest 1 View 12/08/2013 No acute thoracic findings. Atherosclerotic aortic arch. Bony demineralization.  Dg Hip Complete Left 12/08/2013 Acute left hip intertrochanteric fracture. Separate lesser trochanteric fragment. Bilateral hip arthropathy with axial loss of articular space. Antibiotics:  Rocephin 12/08/2013 --> 3/9  Code Status: Full  Family Communication: Pt at bedside  Disposition Plan: Remains inpatient, SNF Monday    Discharge Exam: Filed Vitals:   12/13/13 1509  BP: 96/46  Pulse: 77  Temp: 98.3 F (36.8 C)  Resp: 20   Filed Vitals:   12/13/13 0700 12/13/13 0709 12/13/13 0840 12/13/13 1509  BP:  118/45  96/46  Pulse:  94  77  Temp:  99 F (37.2 C)  98.3 F (36.8 C)  TempSrc:  Oral  Oral  Resp: 22 18  20   Height:      Weight:      SpO2: 80% 94% 96% 97%    General: Pt is alert, follows commands appropriately, not in acute distress Cardiovascular: Regular rate and rhythm, S1/S2 +, no murmurs, no rubs, no gallops Respiratory: Clear to auscultation bilaterally, no wheezing, no crackles, no rhonchi Abdominal: Soft, non tender, non distended, bowel sounds +, no guarding Extremities: no edema, no cyanosis, pulses palpable bilaterally DP and PT Neuro: Grossly nonfocal  Discharge Instructions   Future Appointments Provider Department Dept Phone   12/14/2013 11:00 AM [facility]  Linna Hoff [phone]   12/21/2013 1:30 PM Collene Gobble, MD [facility] [phone]       Medication List    STOP taking these medications       Vitamin D3 3000 UNITS Tabs      TAKE these medications       acetaminophen 325 MG tablet  Commonly known as:  TYLENOL  Take 2 tablets (650 mg total) by mouth every 6 (six) hours as needed (or Fever >/= 101).     ALPRAZolam 0.5 MG tablet  Commonly known  as:  XANAX  Take 0.5-1 tablets (0.25-0.5 mg total) by mouth at bedtime as needed. Sleep     Azelastine HCl 0.15 % Soln  Place 2 sprays into the nose 2 (two) times daily.     betamethasone dipropionate 0.05 % ointment  Commonly known as:  DIPROLENE  Apply 1 application topically daily as needed.     diltiazem 120 MG 24 hr capsule  Commonly known as:  CARDIZEM CD  Take 1 capsule (120 mg total) by mouth daily.     diphenoxylate-atropine 2.5-0.025 MG per tablet  Commonly known as:  LOMOTIL  Take 1-2 tablets by mouth 3 (three) times daily as needed. diarrhea     fluticasone 50 MCG/ACT nasal spray  Commonly known as:  FLONASE  Place 2 sprays into both nostrils 2 (two) times daily.     ipratropium 0.02 % nebulizer solution  Commonly known as:  ATROVENT  Take 2.5 mLs (500 mcg total) by nebulization 4 (four) times daily. DX 496     levalbuterol 45 MCG/ACT inhaler  Commonly known as:  XOPENEX HFA  Inhale 1-2 puffs into the lungs every 4 (four) hours as needed for wheezing or shortness of breath. Wheezing     levothyroxine 125 MCG tablet  Commonly known as:  SYNTHROID, LEVOTHROID  Take 125 mcg by mouth every morning.     methylcellulose 1 % ophthalmic solution  Commonly known as:  ARTIFICIAL TEARS  Place 1 drop into both eyes 2 (two) times daily as needed. Dry eyes     oxyCODONE-acetaminophen 5-325 MG per tablet  Commonly known as:  PERCOCET/ROXICET  Take 1-2 tablets by mouth every 3 (three) hours as needed for moderate pain.     traMADol 50 MG tablet  Commonly known as:  ULTRAM  Take 1-2 tablets (50-100 mg total) by mouth every 8 (eight) hours as needed for moderate pain.     warfarin 5 MG tablet  Commonly known as:  COUMADIN  Take 1-1.5 tablets (5-7.5 mg total) by mouth daily at 6 PM. 7.5mg  on Tuesday and Thursday           Follow-up Information   Schedule an appointment as soon as possible for a visit with Asencion Noble, MD.   Specialty:  Internal Medicine   Contact  information:   [facility] [address] [place] [address] [phone]        The results of significant diagnostics from this hospitalization (including imaging, microbiology, ancillary and laboratory) are listed below for reference.     Microbiology: Recent Results (from the past 240 hour(s))  URINE CULTURE     Status: None   Collection Time    12/08/13  9:58 PM      Result Value Ref Range Status   Specimen Description URINE, CATHETERIZED   Final   Special Requests NONE   Final   Culture  Setup Time     Final   Value:  12/09/2013 01:14     Performed at [facility]     Final   Value: NO GROWTH     Performed at [facility]   Culture     Final   Value: NO GROWTH     Performed at [facility]   Report Status 12/09/2013 FINAL   Final  SURGICAL PCR SCREEN     Status: None   Collection Time    12/10/13  5:15 AM      Result Value Ref Range Status   MRSA, PCR NEGATIVE  NEGATIVE Final   Staphylococcus aureus NEGATIVE  NEGATIVE Final   Comment:            The Xpert SA Assay (FDA     approved for NASAL specimens     in patients over 63 years of age),     is one component of     a comprehensive surveillance     program.  Test performance has     been validated by [employer] for patients greater     than or equal to 52 year old.     It is not intended     to diagnose infection nor to     guide or monitor treatment.     Labs: Basic Metabolic Panel:  Recent Labs Lab 12/08/13 1707 12/10/13 0425 12/11/13 0407 12/12/13 0450 12/13/13 0548  NA 140 134* 135* 133* 133*  K 4.5 4.4 4.5 4.4 4.1  CL 103 98 97 97 96  CO2 29 27 29 30 26   GLUCOSE 117* 121* 121* 98 117*  BUN 14 13 9 8 8   CREATININE 0.89 0.75 0.69 0.70 0.62  CALCIUM 9.2 8.2* 8.1* 8.0* 8.1*   Liver Function Tests:  Recent Labs Lab 12/08/13 1707  AST 18  ALT 16  ALKPHOS 59  BILITOT 0.4  PROT 7.2  ALBUMIN 3.6   CBC:  Recent Labs Lab  12/08/13 1707 12/09/13 0452 12/10/13 0425 12/11/13 0407 12/12/13 0450 12/13/13 0548  WBC 19.9* 8.1 8.2 7.8 6.4 8.7  NEUTROABS 18.0*  --   --   --   --   --   HGB 14.7 12.8 12.1 11.6* 11.0* 11.8*  HCT 43.1 38.9 37.6 34.9* 32.7* 35.0*  MCV 91.3 91.5 91.7 90.6 90.3 90.4  PLT 203 166 149* 143* 140* 148*   SIGNED: Time coordinating discharge: Over 30 minutes  Faye Ramsay, MD  [facility] 12/13/2013, 3:52 PM Pager [phone]  If 7PM-7AM, please contact night-coverage [email or web address] Password [email or web address]

## 2013-12-13 NOTE — Discharge Instructions (Signed)
Hip Fracture (Upper Femoral Fracture) °You have a hip fracture (break in bone). This is a fracture of the upper part of the big bone (femur, thigh bone) between your hip and knee. If your caregiver feels it is a stable fracture, occasionally it can be treated without surgery. Usually these fractures are unstable. This means that the bones will not heal properly without surgery. Surgery is necessary to hold the bones together in a good position where they will heal well. °DIAGNOSIS °A physical exam can determine if a fracture has occurred. X-ray studies are needed to see what type of fracture is present and to look for other injuries. These studies will help your caregiver determine what the best treatment is for you. If there is more than one option, your caregiver can give you the information needed to help you decide on the treatment. °TREATMENT  °The treatment for an unstable fracture is usually surgery. This means using a screw, nail, or rod to hold the bones in place.  °RISKS AND COMPLICATIONS °All surgery is associated with risks. Sometimes the implant may fail. Other complications of surgery include infection or the bones not healing properly. Sometimes the fracture may damage the blood supply to the head of the femur. That portion of bone may die (osteonecrosis or avascular necrosis). Sometimes to avoid this complication, an implant is used which just replaces the ball of the femur (hemi-arthroplasty or prosthetic replacement). Some of the other risks are: °· Excessive bleeding. °· Infection. °· Dislocation if a hemi-arthroplasty or a total hip was inserted. °· Failure to heal properly resulting in an unstable hip. °· Stiffness of hip following repair. °· On occasion, blood may have to be replaced before or during the procedure °LET YOUR CAREGIVERS KNOW ABOUT: °· Allergies. °· Medications taken including herbs, eye drops, over the counter medications, and creams. °· Use of steroids (by mouth or  creams). °· History of bleeding or blood problems. °· History of serious infection. °· Previous problems with anesthetics or novocaine. °· Possibility of pregnancy, if this applies. °· History of blood clots (thrombophlebitis). °· Previous surgery. °· Other health problems. °BEFORE THE PROCEDURE °Before surgery, an IV (intravenous line connected to your vein) may be started. You will be given an anesthetic (medications and gas to make you sleep) or given medications in your back to make you numb from the waist down (spinal anesthetic). °AFTER THE PROCEDURE °After surgery, you will be taken to the recovery area where a nurse will watch your progress. You may have a catheter (a long, narrow, hollow tube) in your bladder that helps you pass your water. Once you're awake, stable, and taking fluids well, you will be returned to your room. You will receive physical therapy and other care until you are doing well and your caregiver feels it is safe for you to be transferred either to home or to an extended care facility. Your activity level will change as your caregiver determines what is best for you. °· You may resume normal diet and activities as directed or allowed. °· Change dressings if necessary or as directed. °· Only take over-the-counter or prescription medicines for pain, discomfort, or fever as directed by your caregiver. °· You may be placed on blood thinners for 4-6 weeks to prevent blood clots. °SEEK IMMEDIATE MEDICAL CARE IF: °· There is swelling of your calf or leg. °· You have shortness of breath or chest pain. °· There is redness, swelling, or increasing pain in the wound. °· There is   pus coming from wound. °· You have an unexplained oral temperature above 102° F (38.9° C). °· There is a foul (bad) smell coming from the wound or dressing. °· There is a breaking open of the wound (edges not staying together) after sutures or staples have been removed. °· There is a marked increase in pain or shortening of  the leg. °· You have severe pain anywhere in the leg. °· There is any change in color or temperature of your leg below the injury. °MAKE SURE YOU:  °· Understand these instructions. °· Will watch your condition. °· Will get help right away if you are not doing well or get worse. °Document Released: 09/24/2005 Document Revised: 12/17/2011 Document Reviewed: 05/06/2013 °ExitCare® Patient Information ©2014 [employer]. ° °

## 2013-12-13 NOTE — Progress Notes (Signed)
Laura CONSULT NOTE - Follow Up  Pharmacy Consult for [name] Indication: atrial fibrillation   Allergies  Allergen Reactions  . Codeine Itching    Patient Measurements: Height: 5\' 5"  (165.1 cm) Weight: 178 lb (80.74 kg) IBW/kg (Calculated) : 57 Heparin Dosing Weight: 75 kg  Vital Signs: Temp: 99 F (37.2 C) (03/08 0709) Temp src: Oral (03/08 0709) BP: 118/45 mmHg (03/08 0709) Pulse Rate: 94 (03/08 0709)  Labs:  Recent Labs  12/10/13 2001  12/11/13 0407 12/12/13 0450 12/12/13 0825 12/13/13 0548  HGB  --   < > 11.6* 11.0*  --  11.8*  HCT  --   --  34.9* 32.7*  --  35.0*  PLT  --   --  143* 140*  --  148*  LABPROT  --   --  16.2*  --  15.0 16.6*  INR  --   --  1.33  --  1.21 1.38  HEPARINUNFRC 0.21*  --   --   --   --   --   CREATININE  --   --  0.69 0.70  --  0.62  < > = values in this interval not displayed.  Estimated Creatinine Clearance: 60.8 ml/min (by C-G formula based on Cr of 0.62).   Medications:  Scheduled:  . cefTRIAXone (ROCEPHIN)  IV  1 g Intravenous Q24H  . diltiazem  120 mg Oral Daily  . docusate sodium  100 mg Oral BID  . enoxaparin (LOVENOX) injection  40 mg Subcutaneous Q24H  . fluticasone  1 spray Each Nare Daily  . ipratropium  500 mcg Nebulization QID  . levothyroxine  125 mcg Oral QAC breakfast  . sodium chloride  3 mL Intravenous Q12H  . [name] - Pharmacist Dosing Inpatient   Does not apply q1800   Infusions:  . sodium chloride 20 mL/hr at 12/13/13 0600    Assessment: [name] admitted 3/3 after falling and found to have L hip fracture.  PMH includes Afib on chronic Laura with [name].  Dose was 5mg  daily except 7.5 mg Tue/Thur with last dose taken on 3/2.  INR on admission was therapeutic at 2.12, but [name] was held for planned surgical intervention.  Pharmacy consulted for Heparin bridge to surgery.  Now, s/p femur fracture repair and MD resumed [name] 3/6 per pharmacy consult with prophylactic Lovenox 40  mg SQ daily starting AM of POD1, until INR >=1.8.  INR suptherapeutic but responding as expected  CBC stable, no bleeding/complications reported.  No drug interactions noted.  Goal of Therapy:  INR 2-3 Monitor platelets by Laura protocol: Yes   Plan:  1.  Repeat [name] 7.5 mg PO tonight. 2.  Daily INR 3.  Continue Lovenox per MD until INR > or = 1.8  [name], PharmD, BCPS Pager: [phone]  12/13/2013 9:13 AM

## 2013-12-14 ENCOUNTER — Encounter (HOSPITAL_COMMUNITY): Payer: Self-pay | Admitting: Orthopedic Surgery

## 2013-12-14 ENCOUNTER — Encounter: Payer: Self-pay | Admitting: *Deleted

## 2013-12-14 LAB — BASIC METABOLIC PANEL
BUN: 8 mg/dL (ref 6–23)
CALCIUM: 8.4 mg/dL (ref 8.4–10.5)
CHLORIDE: 95 meq/L — AB (ref 96–112)
CO2: 29 mEq/L (ref 19–32)
Creatinine, Ser: 0.6 mg/dL (ref 0.50–1.10)
GFR calc non Af Amer: 85 mL/min — ABNORMAL LOW (ref >90)
Glucose, Bld: 97 mg/dL (ref 70–99)
POTASSIUM: 4.2 meq/L (ref 3.7–5.3)
Sodium: 134 mEq/L — ABNORMAL LOW (ref 137–147)

## 2013-12-14 LAB — CBC
HCT: 34.1 % — ABNORMAL LOW (ref 36.0–46.0)
Hemoglobin: 11.3 g/dL — ABNORMAL LOW (ref 12.0–15.0)
MCH: 30.1 pg (ref 26.0–34.0)
MCHC: 33.1 g/dL (ref 30.0–36.0)
MCV: 90.9 fL (ref 78.0–100.0)
Platelets: 158 10*3/uL (ref 150–400)
RBC: 3.75 MIL/uL — AB (ref 3.87–5.11)
RDW: 12.8 % (ref 11.5–15.5)
WBC: 6.4 10*3/uL (ref 4.0–10.5)

## 2013-12-14 LAB — PROTIME-INR
INR: 1.58 — ABNORMAL HIGH (ref 0.00–1.49)
Prothrombin Time: 18.4 seconds — ABNORMAL HIGH (ref 11.6–15.2)

## 2013-12-14 MED ORDER — ENOXAPARIN SODIUM 40 MG/0.4ML ~~LOC~~ SOLN: 40.0000 mg | SUBCUTANEOUS | Status: DC

## 2013-12-14 NOTE — Progress Notes (Signed)
Patient is set to discharge to [facility] today. Patient & daughter at bedside aware. Discharge packet in [place], [place] aware. PTAR scheduled for 2:45pm pickup.   Clinical Social Work Department CLINICAL SOCIAL WORK PLACEMENT NOTE 12/14/2013  Patient:  [name], [name]  Account Number:  [id number] Admit date:  12/08/2013  Clinical Social Worker:  Renold Genta  Date/time:  12/10/2013 03:31 PM  Clinical Social Work is seeking post-discharge placement for this patient at the following level of care:   SKILLED NURSING   (*CSW will update this form in Epic as items are completed)   12/10/2013  Patient/family provided with [place] Department of Clinical Social Work's list of facilities offering this level of care within the geographic area requested by the patient (or if unable, by the patient's family).  12/10/2013  Patient/family informed of their freedom to choose among providers that offer the needed level of care, that participate in Medicare, Medicaid or managed care program needed by the patient, have an available bed and are willing to accept the patient.  12/10/2013  Patient/family informed of [facility]' ownership interest in [facility], as well as of the fact that they are under no obligation to receive care at this facility.  PASARR submitted to EDS on 12/10/2013 PASARR number received from EDS on 12/10/2013  FL2 transmitted to all facilities in geographic area requested by pt/family on  12/10/2013 FL2 transmitted to all facilities within larger geographic area on   Patient informed that his/her managed care company has contracts with or will negotiate with  certain facilities, including the following:     Patient/family informed of bed offers received:  12/10/2013 Patient chooses bed at [facility] Physician recommends and patient chooses bed at    Patient to be transferred to [facility] on  12/14/2013 Patient to be transferred to facility  by PTAR  The following physician request were entered in Epic:   Additional Comments:   Raynaldo Opitz, [place] Worker cell #: [phone]

## 2013-12-14 NOTE — Progress Notes (Signed)
   Subjective: 3 Days Post-Op Procedure(s) (LRB): INTRAMEDULLARY (IM) NAIL FEMORAL (Left) Patient reports pain as mild.   Patient seen in rounds by Dr. Wynelle Link. Patient is well, but has had some minor complaints of pain in the hip, requiring pain medications Plan is to go Skilled nursing facility after hospital stay.  Objective: Vital signs in last 24 hours: Temp:  [97.8 F (36.6 C)-98.3 F (36.8 C)] 97.8 F (36.6 C) (03/09 0546) Pulse Rate:  [77-89] 77 (03/09 0546) Resp:  [16-20] 16 (03/09 0546) BP: (96-132)/(46-63) 119/47 mmHg (03/09 0546) SpO2:  [96 %-98 %] 98 % (03/09 0546)  Intake/Output from previous day:  Intake/Output Summary (Last 24 hours) at 12/14/13 0748 Last data filed at 12/14/13 0547  Gross per 24 hour  Intake    343 ml  Output    750 ml  Net   -407 ml    Intake/Output this shift:    Labs:  Recent Labs  12/12/13 0450 12/13/13 0548 12/14/13 0425  HGB 11.0* 11.8* 11.3*    Recent Labs  12/13/13 0548 12/14/13 0425  WBC 8.7 6.4  RBC 3.87 3.75*  HCT 35.0* 34.1*  PLT 148* 158    Recent Labs  12/13/13 0548 12/14/13 0425  NA 133* 134*  K 4.1 4.2  CL 96 95*  CO2 26 29  BUN 8 8  CREATININE 0.62 0.60  GLUCOSE 117* 97  CALCIUM 8.1* 8.4    Recent Labs  12/13/13 0548 12/14/13 0425  INR 1.38 1.58*    EXAM General - Patient is Alert and Appropriate Extremity - Neurovascular intact Sensation intact distally Dressing/Incision - clean, dry, no drainage Motor Function - intact, moving foot and toes well on exam.   Past Medical History  Diagnosis Date  . Hypothyroidism   . Psoriatic arthritis   . IBS (irritable bowel syndrome)   . Hemorrhoids   . Diverticulosis   . Rectal polyp   . GERD (gastroesophageal reflux disease)   . Family hx of colon cancer   . Uterine polyp   . Arthritis   . COPD (chronic obstructive pulmonary disease)   . Lymphocytic colitis     Assessment/Plan: 3 Days Post-Op Procedure(s) (LRB): INTRAMEDULLARY (IM)  NAIL FEMORAL (Left) Principal Problem:   Fracture of hip, left, closed Active Problems:   HYPOTHYROIDISM   COPD   COLITIS   ARTHRITIS   Atrial fibrillation   Hip fracture  Estimated body mass index is 29.62 kg/(m^2) as calculated from the following:   Height as of this encounter: 5\' 5"  (1.651 m).   Weight as of this encounter: 80.74 kg (178 lb). Up with therapy Discharge to SNF as per Medicine Service  DVT Prophylaxis - Lovenox and Coumadin Weight Bearing As Tolerated left Leg  [name] 12/14/2013, 7:48 AM

## 2013-12-14 NOTE — Progress Notes (Signed)
Called report to [facility], spoke with Dorthula Perfect, RN. CSW arranged transportation and patient is ready for discharge. J.Zappia,RN

## 2013-12-14 NOTE — Discharge Summary (Signed)
Physician Discharge Summary  [name] MGQ:[id number] DOB: 11-14-1932 DOA: 12/08/2013  PCP: Asencion Noble, MD  Admit date: 12/08/2013 Discharge date: 12/14/2013  Recommendations for Outpatient Follow-up:  1. Pt will need to follow up with PCP in 2-3 weeks post discharge 2. Please obtain BMP to evaluate electrolytes and kidney function 3. Please also check CBC to evaluate Hg and Hct levels 4. Pt discharge on Lovenox and Coumadin for DVT prophylaxis  5. Please check PT/INR in 2 days and readjust the dose of Coumadin as indicated  6.  Discharge Diagnoses: Fracture of hip, left, closed  Principal Problem:  Fracture of hip, left, closed  Active Problems:  HYPOTHYROIDISM  COPD  COLITIS  ARTHRITIS  Atrial fibrillation  Hip fracture   Discharge Condition: Stable  Diet recommendation: Heart healthy diet discussed in details  Brief narrative:  80 year old female with past medical history of COPD, atrial fibrillation on AC, hypothyroidism, left TKA, IBS who presented to [facility] ED 12/08/2013 status post mechanical fall and subsequent left hip fracture. In ED, vitals were stable. Blood work revealed WBC count of 19.9 and INR of 2.12. Her urinalysis was unremarkable and CXR did not reveal acute cardiopulmonary findings. She was found to have left intertrochanteric fracture with plan for surgery tomorrow 12/10/2013. She was additionally started on empiric rocephin for possible UTI.   Assessment and Plan:  Principal Problem:  Fracture of hip, left, closed  - s/p intramedullary nailing, Left intertrochanteric femur fracture, post op day #3 - pt is clinically stable and doing well this AM but has some groin pain  - continue pain management with dilaudid 1 mg every 3 hours IV PRN severe pain and percocet PO PRN moderate pain  Active Problems:  Leukocytosis  - secondary to principal problem  - no signs of an infectious etiology  - WBC is now WNL  HYPOTHYROIDISM  - continue levothyroxine  COPD  - stable   - may continue atrovent nebulizer QID and as needed every 2 hours  Atrial fibrillation  - coumadin upon discharge  - rate controlled with cardizem  UTI - on empiric rocephin  - urine culture with no growth  - stop date 3/9   Consultants:  Orthopedic surgery  Procedures/Studies:  Dg Chest 1 View 12/08/2013 No acute thoracic findings. Atherosclerotic aortic arch. Bony demineralization.  Dg Hip Complete Left 12/08/2013 Acute left hip intertrochanteric fracture. Separate lesser trochanteric fragment. Bilateral hip arthropathy with axial loss of articular space. Antibiotics:  Rocephin 12/08/2013 --> 3/9  Code Status: Full  Family Communication: Pt at bedside  Disposition Plan: Remains inpatient, SNF Monday    Discharge Exam: Filed Vitals:   12/14/13 0546  BP: 119/47  Pulse: 77  Temp: 97.8 F (36.6 C)  Resp: 16   Filed Vitals:   12/14/13 0000 12/14/13 0400 12/14/13 0546 12/14/13 0914  BP:   119/47   Pulse:   77   Temp:   97.8 F (36.6 C)   TempSrc:   Oral   Resp: 16 18 16    Height:      Weight:      SpO2:   98% 88%    General: Pt is alert, follows commands appropriately, not in acute distress Cardiovascular: Regular rate and rhythm, S1/S2 +, no murmurs, no rubs, no gallops Respiratory: Clear to auscultation bilaterally, no wheezing, no crackles, no rhonchi Abdominal: Soft, non tender, non distended, bowel sounds +, no guarding Extremities: no edema, no cyanosis, pulses palpable bilaterally DP and PT Neuro: Grossly nonfocal  Discharge Instructions  Discharge Orders   Future Appointments Provider Department Dept Phone   12/21/2013 1:30 PM Collene Gobble, MD [facility] [phone]   Future Orders Complete By Expires   Diet - low sodium heart healthy  As directed    Increase activity slowly  As directed        Medication List    STOP taking these medications       Vitamin D3 3000 UNITS Tabs      TAKE these medications       acetaminophen 325 MG  tablet  Commonly known as:  TYLENOL  Take 2 tablets (650 mg total) by mouth every 6 (six) hours as needed (or Fever >/= 101).     ALPRAZolam 0.5 MG tablet  Commonly known as:  XANAX  Take 0.5-1 tablets (0.25-0.5 mg total) by mouth at bedtime as needed. Sleep     Azelastine HCl 0.15 % Soln  Place 2 sprays into the nose 2 (two) times daily.     betamethasone dipropionate 0.05 % ointment  Commonly known as:  DIPROLENE  Apply 1 application topically daily as needed.     diltiazem 120 MG 24 hr capsule  Commonly known as:  CARDIZEM CD  Take 1 capsule (120 mg total) by mouth daily.     diphenoxylate-atropine 2.5-0.025 MG per tablet  Commonly known as:  LOMOTIL  Take 1-2 tablets by mouth 3 (three) times daily as needed. diarrhea     enoxaparin 40 MG/0.4ML injection  Commonly known as:  LOVENOX  Inject 0.4 mLs (40 mg total) into the skin daily.     fluticasone 50 MCG/ACT nasal spray  Commonly known as:  FLONASE  Place 2 sprays into both nostrils 2 (two) times daily.     ipratropium 0.02 % nebulizer solution  Commonly known as:  ATROVENT  Take 2.5 mLs (500 mcg total) by nebulization 4 (four) times daily. DX 496     levalbuterol 45 MCG/ACT inhaler  Commonly known as:  XOPENEX HFA  Inhale 1-2 puffs into the lungs every 4 (four) hours as needed for wheezing or shortness of breath. Wheezing     levothyroxine 125 MCG tablet  Commonly known as:  SYNTHROID, LEVOTHROID  Take 125 mcg by mouth every morning.     methylcellulose 1 % ophthalmic solution  Commonly known as:  ARTIFICIAL TEARS  Place 1 drop into both eyes 2 (two) times daily as needed. Dry eyes     oxyCODONE-acetaminophen 5-325 MG per tablet  Commonly known as:  PERCOCET/ROXICET  Take 1-2 tablets by mouth every 3 (three) hours as needed for moderate pain.     traMADol 50 MG tablet  Commonly known as:  ULTRAM  Take 1-2 tablets (50-100 mg total) by mouth every 8 (eight) hours as needed for moderate pain.     warfarin 5 MG  tablet  Commonly known as:  COUMADIN  Take 1-1.5 tablets (5-7.5 mg total) by mouth daily at 6 PM. 7.5mg  on Tuesday and Thursday           Follow-up Information   Schedule an appointment as soon as possible for a visit with Asencion Noble, MD.   Specialty:  Internal Medicine   Contact information:   [facility] [address] [place] [address] [phone]       Schedule an appointment as soon as possible for a visit with [name], MD.   Specialty:  Orthopedic Surgery   Contact information:   [address]  [address]  [place] [address] [phone]        The results of significant diagnostics from this hospitalization (including imaging, microbiology, ancillary and laboratory) are listed below for reference.     Microbiology: Recent Results (from the past 240 hour(s))  URINE CULTURE     Status: None   Collection Time    12/08/13  9:58 PM      Result Value Ref Range Status   Specimen Description URINE, CATHETERIZED   Final   Special Requests NONE   Final   Culture  Setup Time     Final   Value: 12/09/2013 01:14     Performed at [facility]     Final   Value: NO GROWTH     Performed at [facility]   Culture     Final   Value: NO GROWTH     Performed at [facility]   Report Status 12/09/2013 FINAL   Final  SURGICAL PCR SCREEN     Status: None   Collection Time    12/10/13  5:15 AM      Result Value Ref Range Status   MRSA, PCR NEGATIVE  NEGATIVE Final   Staphylococcus aureus NEGATIVE  NEGATIVE Final   Comment:            The Xpert SA Assay (FDA     approved for NASAL specimens     in patients over 29 years of age),     is one component of     a comprehensive surveillance     program.  Test performance has     been validated by [employer] for patients greater     than or equal to 77 year old.     It is not intended     to diagnose infection nor to     guide or monitor treatment.      Labs: Basic Metabolic Panel:  Recent Labs Lab 12/10/13 0425 12/11/13 0407 12/12/13 0450 12/13/13 0548 12/14/13 0425  NA 134* 135* 133* 133* 134*  K 4.4 4.5 4.4 4.1 4.2  CL 98 97 97 96 95*  CO2 27 29 30 26 29   GLUCOSE 121* 121* 98 117* 97  BUN 13 9 8 8 8   CREATININE 0.75 0.69 0.70 0.62 0.60  CALCIUM 8.2* 8.1* 8.0* 8.1* 8.4   Liver Function Tests:  Recent Labs Lab 12/08/13 1707  AST 18  ALT 16  ALKPHOS 59  BILITOT 0.4  PROT 7.2  ALBUMIN 3.6   No results found for this basename: LIPASE, AMYLASE,  in the last 168 hours No results found for this basename: AMMONIA,  in the last 168 hours CBC:  Recent Labs Lab 12/08/13 1707  12/10/13 0425 12/11/13 0407 12/12/13 0450 12/13/13 0548 12/14/13 0425  WBC 19.9*  < > 8.2 7.8 6.4 8.7 6.4  NEUTROABS 18.0*  --   --   --   --   --   --   HGB 14.7  < > 12.1 11.6* 11.0* 11.8* 11.3*  HCT 43.1  < > 37.6 34.9* 32.7* 35.0* 34.1*  MCV 91.3  < > 91.7 90.6 90.3 90.4 90.9  PLT 203  < > 149* 143* 140* 148* 158  < > = values in this interval not displayed. Cardiac Enzymes: No results found for this basename: CKTOTAL, CKMB, CKMBINDEX, TROPONINI,  in the last 168 hours BNP: BNP (last 3 results) No results found for this  basename: PROBNP,  in the last 8760 hours CBG: No results found for this basename: GLUCAP,  in the last 168 hours   SIGNED: Time coordinating discharge: Over 30 minutes  Faye Ramsay, MD  [facility] 12/14/2013, 12:09 PM Pager [phone]  If 7PM-7AM, please contact night-coverage [email or web address] Password [email or web address]

## 2013-12-14 NOTE — Progress Notes (Signed)
Physical Therapy Treatment Patient Details Name: [name] MRN: [id number] DOB: 29-May-1933 Today's Date: 12/14/2013 Time: 6712-4580 PT Time Calculation (min): 25 min  PT Assessment / Plan / Recommendation  History of Present Illness S/p L hip IM nail after fall on 12/08/13, surgery performed 3/6.   PT Comments   Pt able to tolerate mobility better today.  Requested pain meds prior to session and pt able to ambulate 11 feet with assist.  Pt also performed LE exercises in recliner.  Pt plans to d/c to SNF.   Follow Up Recommendations  SNF     Does the patient have the potential to tolerate intense rehabilitation     Barriers to Discharge        Equipment Recommendations  None recommended by PT    Recommendations for Other Services    Frequency Min 3X/week   Progress towards PT Goals Progress towards PT goals: Progressing toward goals  Plan Current plan remains appropriate    Precautions / Restrictions Precautions Precautions: Fall Restrictions LLE Weight Bearing: Weight bearing as tolerated   Pertinent Vitals/Pain Premedicated for therapy, increased in L groin pain with mobility however better with rest, repositioned in recliner, ice pack applied    Mobility  Bed Mobility Supine to sit: Mod assist General bed mobility comments: pt able to self assist L LE with R LE part way and then assist provided for L LE off bed, assist required for trunk support as well Transfers Overall transfer level: Needs assistance Equipment used: Rolling walker (2 wheeled) Transfers: Sit to/from Stand Sit to Stand: +2 physical assistance;+2 safety/equipment;Mod assist General transfer comment: verbal cues for safe technique Ambulation/Gait Ambulation/Gait assistance: Min assist Ambulation Distance (Feet): 11 Feet Assistive device: Rolling walker (2 wheeled) Gait Pattern/deviations: Step-to pattern;Antalgic Gait velocity: decr General Gait Details: verbal cues for sequence, RW distance,  step length, pt reports increase in L groin pain with ambulation, fatigues quickly    Exercises General Exercises - Lower Extremity Ankle Circles/Pumps: AROM;Both;10 reps Quad Sets: AROM;Left;10 reps Short Arc Quad: AROM;Left;10 reps Heel Slides: AAROM;Left;10 reps Hip ABduction/ADduction: AAROM;Left;10 reps   PT Diagnosis:    PT Problem List:   PT Treatment Interventions:     PT Goals (current goals can now be found in the care plan section)    Visit Information  Last PT Received On: 12/14/13 Assistance Needed: +2 (equip) History of Present Illness: S/p L hip IM nail after fall on 12/08/13, surgery performed 3/6.    Subjective Data      Cognition  Cognition Arousal/Alertness: Awake/alert Behavior During Therapy: WFL for tasks assessed/performed Overall Cognitive Status: Within Functional Limits for tasks assessed    Balance     End of Session PT - End of Session Equipment Utilized During Treatment: Gait belt;Oxygen Activity Tolerance: Patient limited by pain;Patient limited by fatigue Patient left: in chair;with call bell/phone within reach;with family/visitor present Nurse Communication: Mobility status   GP     Candi Profit,KATHrine E 12/14/2013, 1:31 PM Carmelia Bake, PT, DPT 12/14/2013 Pager: [phone]

## 2013-12-15 ENCOUNTER — Encounter: Payer: Self-pay | Admitting: *Deleted

## 2013-12-15 ENCOUNTER — Other Ambulatory Visit: Payer: Self-pay | Admitting: *Deleted

## 2013-12-15 MED ORDER — DIPHENOXYLATE-ATROPINE 2.5-0.025 MG PO TABS: ORAL_TABLET | ORAL | Status: DC

## 2013-12-15 NOTE — Telephone Encounter (Signed)
[facility] 

## 2013-12-21 ENCOUNTER — Non-Acute Institutional Stay (SKILLED_NURSING_FACILITY): Payer: Medicare Other | Admitting: Internal Medicine

## 2013-12-21 ENCOUNTER — Ambulatory Visit: Payer: Medicare Other | Admitting: Emergency Medicine

## 2013-12-21 ENCOUNTER — Encounter: Payer: Self-pay | Admitting: Internal Medicine

## 2013-12-21 DIAGNOSIS — I4891 Unspecified atrial fibrillation: Secondary | ICD-10-CM

## 2013-12-21 DIAGNOSIS — J449 Chronic obstructive pulmonary disease, unspecified: Secondary | ICD-10-CM

## 2013-12-21 DIAGNOSIS — S72002A Fracture of unspecified part of neck of left femur, initial encounter for closed fracture: Secondary | ICD-10-CM

## 2013-12-21 DIAGNOSIS — M62838 Other muscle spasm: Secondary | ICD-10-CM

## 2013-12-21 DIAGNOSIS — R600 Localized edema: Secondary | ICD-10-CM

## 2013-12-21 DIAGNOSIS — E039 Hypothyroidism, unspecified: Secondary | ICD-10-CM

## 2013-12-21 DIAGNOSIS — S72009A Fracture of unspecified part of neck of unspecified femur, initial encounter for closed fracture: Secondary | ICD-10-CM

## 2013-12-21 DIAGNOSIS — R609 Edema, unspecified: Secondary | ICD-10-CM

## 2013-12-21 NOTE — Assessment & Plan Note (Signed)
raye controlled on diltizem;coumadin prophylaxis

## 2013-12-21 NOTE — Assessment & Plan Note (Signed)
S/p IM nailing 3/5; lovenox d/c on SNF admit and coumadin started for prophylaxis

## 2013-12-21 NOTE — Assessment & Plan Note (Signed)
Continue 125 mcg replacement

## 2013-12-21 NOTE — Assessment & Plan Note (Signed)
Stable; continue flovent and atrovent; O2 2L to keep sats> 88%

## 2013-12-21 NOTE — Progress Notes (Addendum)
MRN: [id number] Name: [name]  Sex: female Age: 80 y.o. DOB: December 20, 1932  [facility] #: [facility] Facility/Room: 101 Level Of Care: SNF Provider: Inocencio Homes D Emergency Contacts: Extended Emergency Contact Information Primary Emergency Contact: [facility] Address: [address]          [place], [place] [address] Johnnette Litter of [place] Phone: [phone] Mobile Phone: [phone] Relation: Daughter Secondary Emergency Contact: Martin,Lucille Address: [address]          [place], [place] [address] Johnnette Litter of [place] Phone: [phone] Mobile Phone: [phone] Relation: Sister    Allergies: Codeine  Chief Complaint  Patient presents with  . nursing home admission    HPI: Patient is 80 y.o. female who is S/p ORIF of L hip ands who is admitted for OT/PT.  Past Medical History  Diagnosis Date  . Hypothyroidism   . Psoriatic arthritis   . IBS (irritable bowel syndrome)   . Hemorrhoids   . Diverticulosis   . Rectal polyp   . GERD (gastroesophageal reflux disease)   . Family hx of colon cancer   . Uterine polyp   . COPD (chronic obstructive pulmonary disease)   . Lymphocytic colitis   . A-fib   . Arthritis     psoriatic  . History of shingles   . Osteoporosis     hx -osteopenia  . Psoriasis     Past Surgical History  Procedure Laterality Date  . Knee surgery      lt.  Marland Kitchen Uterine polypectomy    . Cholecystectomy  Dec 04, 2010  . Total knee revision  08/06/2012    Procedure: TOTAL KNEE REVISION;  Surgeon: [name], MD;  Location: [facility];  Service: Orthopedics;  Laterality: Left;  Left Total Knee Arthroplasty Revision  . Femur im nail Left 12/11/2013    Procedure: INTRAMEDULLARY (IM) NAIL FEMORAL;  Surgeon: [name], MD;  Location: [facility];  Service: Orthopedics;  Laterality: Left;  . Fracture surgery Left 12/11/2013    hip      Medication List       This list is accurate as of: 12/21/13  8:49 PM.  Always use  your most recent med list.               acetaminophen 325 MG tablet  Commonly known as:  TYLENOL  Take 2 tablets (650 mg total) by mouth every 6 (six) hours as needed (or Fever >/= 101).     ALPRAZolam 0.5 MG tablet  Commonly known as:  XANAX  Take 0.5-1 tablets (0.25-0.5 mg total) by mouth at bedtime as needed. Sleep     Azelastine HCl 0.15 % Soln  Place 2 sprays into the nose 2 (two) times daily. For allergic rhinitis     betamethasone dipropionate 0.05 % ointment  Commonly known as:  DIPROLENE  Apply 1 application topically daily as needed. For skin dermatitis     diltiazem 120 MG 24 hr capsule  Commonly known as:  CARDIZEM CD  Take 120 mg by mouth daily. For HTN     diphenoxylate-atropine 2.5-0.025 MG per tablet  Commonly known as:  LOMOTIL  Take one tablet by mouth three times daily as needed for diarrhea     enoxaparin 40 MG/0.4ML injection  Commonly known as:  LOVENOX  Inject 40 mg into the skin daily. For prevention and treatment of blood clots.     fluticasone 50 MCG/ACT nasal spray  Commonly known as:  FLONASE  Place 2 sprays into both nostrils 2 (two) times daily. For nasal congestion     ipratropium 0.02 % nebulizer solution  Commonly known as:  ATROVENT  - Take 500 mcg by nebulization 4 (four) times daily. DX 496  - For asthma     levalbuterol 45 MCG/ACT inhaler  Commonly known as:  XOPENEX HFA  Inhale 1-2 puffs into the lungs every 4 (four) hours as needed for wheezing or shortness of breath. Wheezing     levothyroxine 125 MCG tablet  Commonly known as:  SYNTHROID, LEVOTHROID  Take 125 mcg by mouth every morning. For thyroid therapy     methylcellulose 1 % ophthalmic solution  Commonly known as:  ARTIFICIAL TEARS  Place 1 drop into both eyes 2 (two) times daily as needed. Dry eyes     oxyCODONE-acetaminophen 5-325 MG per tablet  Commonly known as:  PERCOCET/ROXICET  Take 1-2 tablets by mouth every 3 (three) hours as needed for moderate pain.      traMADol 50 MG tablet  Commonly known as:  ULTRAM  Take 1-2 tablets (50-100 mg total) by mouth every 8 (eight) hours as needed for moderate pain.     warfarin 5 MG tablet  Commonly known as:  COUMADIN  - Take 5-7.5 mg by mouth daily at 6 PM. 7.5mg  on Tuesday and Thursday  - For prevention of new blood clots.        No orders of the defined types were placed in this encounter.    Immunization History  Administered Date(s) Administered  . Influenza Split 07/09/2011, 08/19/2013  . Influenza Whole 07/08/2009, 07/08/2010, 07/10/2012  . Pneumococcal Polysaccharide-23 07/08/2008    History  Substance Use Topics  . Smoking status: Former Smoker -- 1.00 packs/day for 20 years    Types: Cigarettes    Quit date: 10/08/1980  . Smokeless tobacco: Never Used  . Alcohol Use: Yes     Comment: rarely    Family history is noncontributory    Review of Systems  DATA OBTAINED: from patient GENERAL: Feels well no fevers, fatigue, appetite changes SKIN: No itching, rash or wounds EYES: No eye pain, redness, discharge EARS: No earache, tinnitus, change in hearing NOSE: No congestion, drainage or bleeding  MOUTH/THROAT: No mouth or tooth pain, No sore throat, No difficulty chewing or swallowing  RESPIRATORY: No cough, wheezing, SOB CARDIAC: No chest pain, palpitations, lower extremity edema  GI: No abdominal pain, No N/V/D or constipation, No heartburn or reflux  GU: No dysuria, frequency or urgency, or incontinence  MUSCULOSKELETAL:c/o muscle spasms NEUROLOGIC: No headache, dizziness or focal weakness PSYCHIATRIC: No overt anxiety or sadness. Sleeps well. No behavior issue.   Filed Vitals:   12/21/13 2040  BP: 150/70  Pulse: 100  Temp: 97.2 F (36.2 C)  Resp: 20    Physical Exam  GENERAL APPEARANCE: Alert, conversant. Appropriately groomed. No acute distress.  SKIN: No diaphoresis rash, or wounds HEAD: Normocephalic, atraumatic  EYES: Conjunctiva/lids clear. Pupils round,  reactive. EOMs intact.  EARS: External exam WNL, canals clear. Hearing grossly normal.  NOSE: No deformity or discharge.  MOUTH/THROAT: Lips w/o lesions  RESPIRATORY: Breathing is even, unlabored. Lung sounds are clear   CARDIOVASCULAR: Heart RRR no murmurs, rubs or gallops. 1/2+ peripheral edema.  GASTROINTESTINAL: Abdomen is soft, non-tender, not distended w/ normal bowel sounds GENITOURINARY: Bladder non tender, not distended  MUSCULOSKELETAL: No abnormal joints or musculature NEUROLOGIC: Oriented X3. Cranial nerves 2-12 grossly intact. Moves all extremities no tremor. PSYCHIATRIC: Mood and affect appropriate  to situation, no behavioral issues  Patient Active Problem List   Diagnosis Date Noted  . Fracture of hip, left, closed 12/08/2013  . Hip fracture 12/08/2013  . PAD (peripheral artery disease) 11/25/2013  . Encounter for therapeutic drug monitoring 11/09/2013  . Atrial fibrillation 05/07/2013  . Cough 03/20/2013  . Total knee replacement status 10/07/2012  . Knee pain 10/07/2012  . Knee stiffness 10/07/2012  . Tachycardia 09/17/2012  . Chest pain 09/17/2012  . Difficulty in walking 09/16/2012  . Muscle weakness (generalized) 09/16/2012  . Postop Acute blood loss anemia 08/08/2012  . Instability of prosthetic knee 08/06/2012  . Bloating 02/14/2012  . Upper abdominal pain 02/14/2012  . Allergic rhinitis, seasonal 02/15/2011  . COLITIS 02/20/2010  . DIARRHEA 01/02/2010  . ABDOMINAL PAIN -GENERALIZED 01/02/2010  . PERSONAL HX COLONIC POLYPS 01/02/2010  . HYPOTHYROIDISM 12/28/2009  . COPD 12/28/2009  . ARTHRITIS 12/28/2009    CBC    Component Value Date/Time   WBC 6.4 12/14/2013 0425   RBC 3.75* 12/14/2013 0425   HGB 11.3* 12/14/2013 0425   HCT 34.1* 12/14/2013 0425   PLT 158 12/14/2013 0425   MCV 90.9 12/14/2013 0425   LYMPHSABS 1.0 12/08/2013 1707   MONOABS 0.8 12/08/2013 1707   EOSABS 0.1 12/08/2013 1707   BASOSABS 0.0 12/08/2013 1707    CMP     Component Value Date/Time    NA 134* 12/14/2013 0425   K 4.2 12/14/2013 0425   CL 95* 12/14/2013 0425   CO2 29 12/14/2013 0425   GLUCOSE 97 12/14/2013 0425   BUN 8 12/14/2013 0425   CREATININE 0.60 12/14/2013 0425   CALCIUM 8.4 12/14/2013 0425   PROT 7.2 12/08/2013 1707   ALBUMIN 3.6 12/08/2013 1707   AST 18 12/08/2013 1707   ALT 16 12/08/2013 1707   ALKPHOS 59 12/08/2013 1707   BILITOT 0.4 12/08/2013 1707   GFRNONAA 85* 12/14/2013 0425   GFRAA >90 12/14/2013 0425    Assessment and Plan  Fracture of hip, left, closed S/p IM nailing 3/5; lovenox d/c on SNF admit and coumadin started for prophylaxis  HYPOTHYROIDISM Continue 125 mcg replacement  COPD Stable; continue flovent and atrovent; O2 2L to keep sats> 88%  Atrial fibrillation raye controlled on diltizem;coumadin prophylaxis  PEDAL EDEMA - TED hose  MUSCLE SPASMS- start robaxin 500 mg TID  Hennie Duos, MD

## 2014-01-06 ENCOUNTER — Non-Acute Institutional Stay (SKILLED_NURSING_FACILITY): Payer: Medicare Other | Admitting: Internal Medicine

## 2014-01-06 ENCOUNTER — Encounter: Payer: Self-pay | Admitting: Internal Medicine

## 2014-01-06 DIAGNOSIS — I4891 Unspecified atrial fibrillation: Secondary | ICD-10-CM

## 2014-01-06 DIAGNOSIS — I739 Peripheral vascular disease, unspecified: Secondary | ICD-10-CM

## 2014-01-06 DIAGNOSIS — J449 Chronic obstructive pulmonary disease, unspecified: Secondary | ICD-10-CM

## 2014-01-06 DIAGNOSIS — E039 Hypothyroidism, unspecified: Secondary | ICD-10-CM

## 2014-01-06 DIAGNOSIS — S72009A Fracture of unspecified part of neck of unspecified femur, initial encounter for closed fracture: Secondary | ICD-10-CM

## 2014-01-06 DIAGNOSIS — S72002A Fracture of unspecified part of neck of left femur, initial encounter for closed fracture: Secondary | ICD-10-CM

## 2014-01-06 NOTE — Progress Notes (Signed)
MRN: [id number] Name: [name]  Sex: female Age: 80 y.o. DOB: May 23, 1933  [place] #: [place] Facility/Room: 101 Level Of Care: SNF Provider: Inocencio Homes D Emergency Contacts: Extended Emergency Contact Information Primary Emergency Contact: [facility] Address: [address]          [place], [place] [address] Johnnette Litter of [place] Phone: [phone] Mobile Phone: [phone] Relation: Daughter Secondary Emergency Contact: Martin,Lucille Address: [address]          [place], [place] [address] Johnnette Litter of [place] Phone: [phone] Mobile Phone: [phone] Relation: Sister  Code Status:   Allergies: Codeine  Chief Complaint  Patient presents with  . Discharge Note    HPI: Patient is 80 y.o. female who had L hip surgery, here for OT/PT who is ready to be discharged.  Past Medical History  Diagnosis Date  . Hypothyroidism   . Psoriatic arthritis   . IBS (irritable bowel syndrome)   . Hemorrhoids   . Diverticulosis   . Rectal polyp   . GERD (gastroesophageal reflux disease)   . Family hx of colon cancer   . Uterine polyp   . COPD (chronic obstructive pulmonary disease)   . Lymphocytic colitis   . A-fib   . Arthritis     psoriatic  . History of shingles   . Osteoporosis     hx -osteopenia  . Psoriasis     Past Surgical History  Procedure Laterality Date  . Knee surgery      lt.  Marland Kitchen Uterine polypectomy    . Cholecystectomy  Dec 04, 2010  . Total knee revision  08/06/2012    Procedure: TOTAL KNEE REVISION;  Surgeon: [name], MD;  Location: [facility];  Service: Orthopedics;  Laterality: Left;  Left Total Knee Arthroplasty Revision  . Femur im nail Left 12/11/2013    Procedure: INTRAMEDULLARY (IM) NAIL FEMORAL;  Surgeon: [name], MD;  Location: [facility];  Service: Orthopedics;  Laterality: Left;  . Fracture surgery Left 12/11/2013    hip      Medication List       This list is accurate as of: 01/06/14 11:17 AM.   Always use your most recent med list.               acetaminophen 325 MG tablet  Commonly known as:  TYLENOL  Take 2 tablets (650 mg total) by mouth every 6 (six) hours as needed (or Fever >/= 101).     ALPRAZolam 0.5 MG tablet  Commonly known as:  XANAX  Take 0.5-1 tablets (0.25-0.5 mg total) by mouth at bedtime as needed. Sleep     Azelastine HCl 0.15 % Soln  Place 2 sprays into the nose 2 (two) times daily. For allergic rhinitis     diltiazem 120 MG 24 hr capsule  Commonly known as:  CARDIZEM CD  Take 120 mg by mouth daily. For HTN     diphenoxylate-atropine 2.5-0.025 MG per tablet  Commonly known as:  LOMOTIL  Take one tablet by mouth three times daily as needed for diarrhea     fluticasone 50 MCG/ACT nasal spray  Commonly known as:  FLONASE  Place 2 sprays into both nostrils 2 (two) times daily. For nasal congestion     ipratropium 0.02 % nebulizer solution  Commonly known as:  ATROVENT  - Take 500 mcg by nebulization 4 (four) times daily. DX 496  - For asthma     levalbuterol 45 MCG/ACT inhaler  Commonly known as:  XOPENEX HFA  Inhale 1-2 puffs into the lungs every 4 (four) hours as needed for wheezing or shortness of breath. Wheezing     levothyroxine 125 MCG tablet  Commonly known as:  SYNTHROID, LEVOTHROID  Take 125 mcg by mouth every morning. For thyroid therapy     methocarbamol 500 MG tablet  Commonly known as:  ROBAXIN  Take 500 mg by mouth 3 (three) times daily.     methylcellulose 1 % ophthalmic solution  Commonly known as:  ARTIFICIAL TEARS  Place 1 drop into both eyes 2 (two) times daily as needed. Dry eyes     oxyCODONE-acetaminophen 5-325 MG per tablet  Commonly known as:  PERCOCET/ROXICET  Take 1-2 tablets by mouth every 3 (three) hours as needed for moderate pain.     warfarin 5 MG tablet  Commonly known as:  COUMADIN  Take 5.5 mg by mouth daily at 6 PM. As of 4/1 pt is on 5.5 mg daily-INR 4/3        Meds ordered this encounter   Medications  . methocarbamol (ROBAXIN) 500 MG tablet    Sig: Take 500 mg by mouth 3 (three) times daily.    Immunization History  Administered Date(s) Administered  . Influenza Split 07/09/2011, 08/19/2013  . Influenza Whole 07/08/2009, 07/08/2010, 07/10/2012  . Pneumococcal Polysaccharide-23 07/08/2008    History  Substance Use Topics  . Smoking status: Former Smoker -- 1.00 packs/day for 20 years    Types: Cigarettes    Quit date: 10/08/1980  . Smokeless tobacco: Never Used  . Alcohol Use: Yes     Comment: rarely    Filed Vitals:   01/06/14 1028  BP: 138/76  Pulse: 91  Temp: 99.4 F (37.4 C)  Resp: 23    Physical Exam  GENERAL APPEARANCE: Alert, conversant. Appropriately groomed. No acute distress.  HEENT: Unremarkable. RESPIRATORY: Breathing is even, unlabored. Lung sounds are clear   CARDIOVASCULAR: Heart RRR no murmurs, rubs or gallops. 1+ L peripheral edema.  GASTROINTESTINAL: Abdomen is soft, non-tender, not distended w/ normal bowel sounds.  NEUROLOGIC: Cranial nerves 2-12 grossly intact. Moves all extremities no tremor.  Patient Active Problem List   Diagnosis Date Noted  . Fracture of hip, left, closed 12/08/2013  . Hip fracture 12/08/2013  . PAD (peripheral artery disease) 11/25/2013  . Encounter for therapeutic drug monitoring 11/09/2013  . Atrial fibrillation 05/07/2013  . Cough 03/20/2013  . Total knee replacement status 10/07/2012  . Knee pain 10/07/2012  . Knee stiffness 10/07/2012  . Tachycardia 09/17/2012  . Chest pain 09/17/2012  . Difficulty in walking 09/16/2012  . Muscle weakness (generalized) 09/16/2012  . Postop Acute blood loss anemia 08/08/2012  . Instability of prosthetic knee 08/06/2012  . Bloating 02/14/2012  . Upper abdominal pain 02/14/2012  . Allergic rhinitis, seasonal 02/15/2011  . COLITIS 02/20/2010  . DIARRHEA 01/02/2010  . ABDOMINAL PAIN -GENERALIZED 01/02/2010  . PERSONAL HX COLONIC POLYPS 01/02/2010  .  HYPOTHYROIDISM 12/28/2009  . COPD 12/28/2009  . ARTHRITIS 12/28/2009    CBC    Component Value Date/Time   WBC 6.4 12/14/2013 0425   RBC 3.75* 12/14/2013 0425   HGB 11.3* 12/14/2013 0425   HCT 34.1* 12/14/2013 0425   PLT 158 12/14/2013 0425   MCV 90.9 12/14/2013 0425   LYMPHSABS 1.0 12/08/2013 1707   MONOABS 0.8 12/08/2013 1707   EOSABS 0.1 12/08/2013 1707   BASOSABS 0.0 12/08/2013 1707    CMP     Component Value Date/Time  NA 134* 12/14/2013 0425   K 4.2 12/14/2013 0425   CL 95* 12/14/2013 0425   CO2 29 12/14/2013 0425   GLUCOSE 97 12/14/2013 0425   BUN 8 12/14/2013 0425   CREATININE 0.60 12/14/2013 0425   CALCIUM 8.4 12/14/2013 0425   PROT 7.2 12/08/2013 1707   ALBUMIN 3.6 12/08/2013 1707   AST 18 12/08/2013 1707   ALT 16 12/08/2013 1707   ALKPHOS 59 12/08/2013 1707   BILITOT 0.4 12/08/2013 1707   GFRNONAA 85* 12/14/2013 0425   GFRAA >90 12/14/2013 0425    Assessment and Plan  Patient is being discharged to home in stable and improved condition  Hennie Duos, MD

## 2014-01-27 ENCOUNTER — Ambulatory Visit (INDEPENDENT_AMBULATORY_CARE_PROVIDER_SITE_OTHER): Payer: Medicare Other | Admitting: Emergency Medicine

## 2014-01-27 ENCOUNTER — Encounter: Payer: Self-pay | Admitting: Emergency Medicine

## 2014-01-27 VITALS — BP 132/64 | HR 91 | Ht 65.0 in | Wt 181.0 lb

## 2014-01-27 DIAGNOSIS — J449 Chronic obstructive pulmonary disease, unspecified: Secondary | ICD-10-CM

## 2014-01-27 NOTE — Assessment & Plan Note (Signed)
Stop your atrovent nebs.  Start using Anoro once a day  Use xopenex 2 puffs if needed  Follow with Dr Lamonte Sakai in 1 month to review status on the new medication.

## 2014-01-27 NOTE — Patient Instructions (Signed)
Stop your atrovent nebs.  Start using Anoro once a day  Use xopenex 2 puffs if needed  Follow with Dr Lamonte Sakai in 1 month to review status on the new medication.

## 2014-01-27 NOTE — Progress Notes (Signed)
Subjective:    Patient ID: [name], female    DOB: 1932-12-10, 80 y.o.   MRN: [id number] HPI 80 yo former smoker (20 pk-yrs), hx of lymphocytic colitis/IBS on prednisone, hypothyroidism, psoriatic arthritis formerly on MTX, OA s/p knee replacement. Presents for evaluation of slowly progressive dyspnea. Bothers her with walking, climbing stairs. Also mentions frequent bouts of bronchitis with apparent associated bronchospasm. Had an acute bronchitis in 2/11, treated with pred + abx + SABA (xopenex due to tachycardia with albuterol). Then started on Spiriva. Now better but still SOB with exertion.   ROV 01/31/10 -- returns for f/u and after PFTs. Feels that she has benefitted from the Spiriva, her exertional symptoms and nighttime symptoms are much improved. Has had PFTs as below.   ROV 08/09/10 -- f/u for COPD (moderate AFL on spiro w no BD response). She was on Spiriva and felt that it helped her breathing, but it bothered her UA - made it dry, hoarse, throat burning. No allergy symptoms. She has been taking the xopenex. C/o some soreness on tongue, ? thrush.   ROV 10/17/10 -- moderate COPD, had been off pred for IBS/colitis since our last visit, just restarted by Dr Henrene Pastor. She doesn't think her breathing worsened off the pred. Tried Symbicort but thout the Spiriva helped her breathing more. She didn't have any side effects from the Symbicort. She is interested in retrying the Spiriva to see if she can tolerate. No flares or exacerbations since last visit. Rare xopenex use.  ROV 02/15/11 -- returns for COPD. We have tried both Symbicort and Spiriva. Last time we went back on Spiriva  - she believes that it has helped, but she is having some hoarse voice. Believes that it is due to Spiriva + a worsening of her allergies. Started on zyrtec in March with some relief but still bothering her. Since last visit Prednisone has been restarted. Had emergency sgy for cholecystitis in February. Having GERD sx  at night when she lays down.   ROV 06/19/11 -- follow up for COPD. She is tolerating the spiriva. Her prednisone was weaned to off in June, hasn't been restarted, she has f/u w Dr Henrene Pastor today. She rarely uses xopenex. No flares since last visit. She does still have some exertional sob.   ROV 12/18/11 -- COPD, also w hx of IBS/colitis followed by Dr Henrene Pastor. Currently on Spiriva, has xopenex to use prn - uses rarely. Still gets SOB with walking or with stairs. She thinks Spiriva helps but it also can make her throat hoarse. Not on anti-inflammatory meds at this time.   ROV 06/17/12 -- COPD, also w hx of IBS/colitis followed by Dr Henrene Pastor. She is using Spiriva. She has had more trouble with allergies and associated cough over the last several months. Not currently on zyrtec. She did benefit from chlorpheniramine. Rare SABA use.   07/28/2012 Acute OV  Complains of dry cough, chest congestion, increased SOB, wheezing, tightness in chest, head congestion w/ clear drainage, PND x4 days .  Has upcoming surgery next week - redo of knee replacement w/ ortho No hemoptysis or chest pain. No edema.  No recent abx use.  >>Omnicef x 7 d   Acute OV 08/04/12 --  Patient returns for persistent cough and congestion. The patient was seen 1 week ago for bronchitis and treated with 7 days of Omnicef. She has upcoming surgery on October 30 for a knee replacement She states that she is feeling much better but still has some  sinus drainage, congestion, and cough. She denies any discolored mucus or fever or chest pain, shortness of breath, or edema CXR last week w/ no acute process    ROV 12/25/12 -- COPD, also w hx of IBS/colitis followed by Dr Henrene Pastor. She returns for regular f/u. Treated in Oct 13 for AE as above. She is starting gto have allergy sx for last 2 weeks, bad at night. She is back on her zyrtec. She believes that her breathing is a bit more limiting than a year ago, SOB w exertion and with lifting objects. She  has tried symbicort before.    PULMONARY FUNCTON TEST 01/18/2010  FVC 2.54  FEV1 1.19  FEV1/FVC 46.9  FVC  % Predicted 97  FEV % Predicted 65  FeF 25-75 .37  FeF 25-75 % Predicted 2.11      ROV 02/05/13 -- COPD, also w hx of IBS/colitis, allergic rhinitis w cough. Last time we trialed Breo qd in addition to Spiriva. She isn't sure that the Memory Dance has been beneficial. Her breathing is similar. She has had congestion and allergic sx for the last few days. She restarted zyrtec 5-6 days ago. She has chlor-trimeton but hasn't used yet.   ROV 02/20/13 -- COPD, also w hx of IBS/colitis, allergic rhinitis w cough. Last time added fluticasone to zyrtec, asked her to try chlorpheniramine. Stopped Breo (no effect), continued Spiriva. She is having chest tightness "heaviness" that can go to her back. Some exertional dyspnea. The tightness in her chest has been of/on for a year. Her chest congestion is better, clear nasal drainage.   ROV 03/20/13 -- COPD, also w hx of IBS/colitis, allergic rhinitis w cough. She was having chest discomfort and cough last time - added omeprazole, also added astelin to fluticasone and zyrtec. She is much better. She took the astelin, took omeprazole.   ROV 09/18/13 -- COPD, also w hx of IBS/colitis, allergic rhinitis w cough. She was treated a couple months ago with pred + abx for a possible AE. She is off spiriva, is using ipratropium 3-4x a day. Currently experiencing cough and clear mucous. She is not on fluticasone, astelin or zyrtec. Not on omeprazole  ROV 01/07/14 -- COPD, also w hx of IBS/colitis, allergic rhinitis w cough. Unfortunately since last time she fractured her L hip and it was repaired, did rehab and completed. Now back home. She was just started on pred for lower back pain by Dr Alusio's office.  She is doing nasal steroid and atrovent nasal spray.  She is on atrovent qid, xopenex prn.    Objective:   Physical Exam Filed Vitals:   01/27/14 1204  BP: 132/64   Pulse: 91  Height: 5' 5"  (1.651 m)  Weight: 181 lb (82.101 kg)  SpO2: 93%   Gen: Pleasant, overwt, in no distress,  normal affect  ENT: No lesions,  mouth clear,  oropharynx clear, no postnasal drip  Neck: No JVD, no TMG, no carotid bruits  Lungs: No use of accessory muscles, slight wheeze on forced expiration.   Cardiovascular: RRR, heart sounds normal, no murmur or gallops, no peripheral edema  Musculoskeletal: No deformities, no cyanosis or clubbing  Neuro: alert, non focal  Skin: Warm, no lesions or rashes   Assessment & Plan:  COPD Stop your atrovent nebs.  Start using Anoro once a day  Use xopenex 2 puffs if needed  Follow with Dr Lamonte Sakai in 1 month to review status on the new medication.

## 2014-01-29 ENCOUNTER — Telehealth: Payer: Self-pay | Admitting: *Deleted

## 2014-01-29 NOTE — Telephone Encounter (Signed)
Jenny Reichmann from [facility] called.  She discharged pt today.  They have been seeing pt since D/C from [facility] [place] and have been calling INR's to Dr Willey Blade. 4/8  INR 2.3 on 5mg  daily except 7.5mg  on Tuesdays and Thursdays   Pt was started on Prednisone taper. 4/15  INR 3.5  On 5mg  daily except 7.5mg  on Tuesdays and Thursdays.  Dose was decrease to 5mg  daily by Dr Willey Blade 4/23  INR 3.6  On 5mg  daily  Coumadin decreased to 2.5mg  daily by Dr Willey Blade  Has finished prednisone taper.  Dr Willey Blade turned coumadin management back over to [place].  She was made an INR appt on Thursday 4/30 @ 10:50am.  Jenny Reichmann notified pt.

## 2014-02-01 ENCOUNTER — Encounter (INDEPENDENT_AMBULATORY_CARE_PROVIDER_SITE_OTHER): Payer: Self-pay | Admitting: *Deleted

## 2014-02-04 ENCOUNTER — Ambulatory Visit (INDEPENDENT_AMBULATORY_CARE_PROVIDER_SITE_OTHER): Payer: Medicare Other | Admitting: *Deleted

## 2014-02-04 DIAGNOSIS — I4891 Unspecified atrial fibrillation: Secondary | ICD-10-CM

## 2014-02-04 DIAGNOSIS — Z5181 Encounter for therapeutic drug level monitoring: Secondary | ICD-10-CM

## 2014-02-04 LAB — POCT INR: INR: 1.5

## 2014-02-11 ENCOUNTER — Ambulatory Visit (INDEPENDENT_AMBULATORY_CARE_PROVIDER_SITE_OTHER): Payer: Medicare Other | Admitting: *Deleted

## 2014-02-11 DIAGNOSIS — Z5181 Encounter for therapeutic drug level monitoring: Secondary | ICD-10-CM

## 2014-02-11 DIAGNOSIS — I4891 Unspecified atrial fibrillation: Secondary | ICD-10-CM

## 2014-02-11 LAB — POCT INR: INR: 2.8

## 2014-02-24 ENCOUNTER — Ambulatory Visit (INDEPENDENT_AMBULATORY_CARE_PROVIDER_SITE_OTHER): Payer: Medicare Other | Admitting: *Deleted

## 2014-02-24 DIAGNOSIS — I4891 Unspecified atrial fibrillation: Secondary | ICD-10-CM

## 2014-02-24 DIAGNOSIS — Z5181 Encounter for therapeutic drug level monitoring: Secondary | ICD-10-CM

## 2014-02-24 LAB — POCT INR: INR: 3.2

## 2014-02-26 ENCOUNTER — Ambulatory Visit (INDEPENDENT_AMBULATORY_CARE_PROVIDER_SITE_OTHER): Payer: Medicare Other | Admitting: Emergency Medicine

## 2014-02-26 ENCOUNTER — Encounter: Payer: Self-pay | Admitting: Emergency Medicine

## 2014-02-26 VITALS — BP 122/86 | HR 100 | Ht 65.0 in | Wt 188.0 lb

## 2014-02-26 DIAGNOSIS — J4489 Other specified chronic obstructive pulmonary disease: Secondary | ICD-10-CM

## 2014-02-26 DIAGNOSIS — J449 Chronic obstructive pulmonary disease, unspecified: Secondary | ICD-10-CM

## 2014-02-26 MED ORDER — UMECLIDINIUM-VILANTEROL 62.5-25 MCG/INH IN AEPB: 1.0000 | INHALATION_SPRAY | Freq: Every day | RESPIRATORY_TRACT | Status: DC

## 2014-02-26 NOTE — Assessment & Plan Note (Signed)
Significant improvement on the Anoro. Will continue this + xopenex prn. rov in 6 months

## 2014-02-26 NOTE — Patient Instructions (Signed)
Please continue your Anoro once a day Use xopenex 2 puffs if needed for shortness of breath.  Follow with Dr Lamonte Sakai in 6 months or sooner if you have any problems

## 2014-02-26 NOTE — Progress Notes (Signed)
Subjective:    Patient ID: [name], female    DOB: 03/03/1933, 80 y.o.   MRN: [id number] HPI 80 yo former smoker (20 pk-yrs), hx of lymphocytic colitis/IBS on prednisone, hypothyroidism, psoriatic arthritis formerly on MTX, OA s/p knee replacement. Presents for evaluation of slowly progressive dyspnea. Bothers her with walking, climbing stairs. Also mentions frequent bouts of bronchitis with apparent associated bronchospasm. Had an acute bronchitis in 2/11, treated with pred + abx + SABA (xopenex due to tachycardia with albuterol). Then started on Spiriva. Now better but still SOB with exertion.   ROV 01/31/10 -- returns for f/u and after PFTs. Feels that she has benefitted from the Spiriva, her exertional symptoms and nighttime symptoms are much improved. Has had PFTs as below.   ROV 08/09/10 -- f/u for COPD (moderate AFL on spiro w no BD response). She was on Spiriva and felt that it helped her breathing, but it bothered her UA - made it dry, hoarse, throat burning. No allergy symptoms. She has been taking the xopenex. C/o some soreness on tongue, ? thrush.   ROV 10/17/10 -- moderate COPD, had been off pred for IBS/colitis since our last visit, just restarted by Dr Henrene Pastor. She doesn't think her breathing worsened off the pred. Tried Symbicort but thout the Spiriva helped her breathing more. She didn't have any side effects from the Symbicort. She is interested in retrying the Spiriva to see if she can tolerate. No flares or exacerbations since last visit. Rare xopenex use.  ROV 02/15/11 -- returns for COPD. We have tried both Symbicort and Spiriva. Last time we went back on Spiriva  - she believes that it has helped, but she is having some hoarse voice. Believes that it is due to Spiriva + a worsening of her allergies. Started on zyrtec in March with some relief but still bothering her. Since last visit Prednisone has been restarted. Had emergency sgy for cholecystitis in February. Having GERD sx  at night when she lays down.   ROV 06/19/11 -- follow up for COPD. She is tolerating the spiriva. Her prednisone was weaned to off in June, hasn't been restarted, she has f/u w Dr Henrene Pastor today. She rarely uses xopenex. No flares since last visit. She does still have some exertional sob.   ROV 12/18/11 -- COPD, also w hx of IBS/colitis followed by Dr Henrene Pastor. Currently on Spiriva, has xopenex to use prn - uses rarely. Still gets SOB with walking or with stairs. She thinks Spiriva helps but it also can make her throat hoarse. Not on anti-inflammatory meds at this time.   ROV 06/17/12 -- COPD, also w hx of IBS/colitis followed by Dr Henrene Pastor. She is using Spiriva. She has had more trouble with allergies and associated cough over the last several months. Not currently on zyrtec. She did benefit from chlorpheniramine. Rare SABA use.   07/28/2012 Acute OV  Complains of dry cough, chest congestion, increased SOB, wheezing, tightness in chest, head congestion w/ clear drainage, PND x4 days .  Has upcoming surgery next week - redo of knee replacement w/ ortho No hemoptysis or chest pain. No edema.  No recent abx use.  >>Omnicef x 7 d   Acute OV 08/04/12 --  Patient returns for persistent cough and congestion. The patient was seen 1 week ago for bronchitis and treated with 7 days of Omnicef. She has upcoming surgery on October 30 for a knee replacement She states that she is feeling much better but still has some  sinus drainage, congestion, and cough. She denies any discolored mucus or fever or chest pain, shortness of breath, or edema CXR last week w/ no acute process    ROV 12/25/12 -- COPD, also w hx of IBS/colitis followed by Dr Henrene Pastor. She returns for regular f/u. Treated in Oct 13 for AE as above. She is starting gto have allergy sx for last 2 weeks, bad at night. She is back on her zyrtec. She believes that her breathing is a bit more limiting than a year ago, SOB w exertion and with lifting objects. She  has tried symbicort before.    PULMONARY FUNCTON TEST 01/18/2010  FVC 2.54  FEV1 1.19  FEV1/FVC 46.9  FVC  % Predicted 97  FEV % Predicted 65  FeF 25-75 .37  FeF 25-75 % Predicted 2.11      ROV 02/05/13 -- COPD, also w hx of IBS/colitis, allergic rhinitis w cough. Last time we trialed Breo qd in addition to Spiriva. She isn't sure that the Memory Dance has been beneficial. Her breathing is similar. She has had congestion and allergic sx for the last few days. She restarted zyrtec 5-6 days ago. She has chlor-trimeton but hasn't used yet.   ROV 02/20/13 -- COPD, also w hx of IBS/colitis, allergic rhinitis w cough. Last time added fluticasone to zyrtec, asked her to try chlorpheniramine. Stopped Breo (no effect), continued Spiriva. She is having chest tightness "heaviness" that can go to her back. Some exertional dyspnea. The tightness in her chest has been of/on for a year. Her chest congestion is better, clear nasal drainage.   ROV 03/20/13 -- COPD, also w hx of IBS/colitis, allergic rhinitis w cough. She was having chest discomfort and cough last time - added omeprazole, also added astelin to fluticasone and zyrtec. She is much better. She took the astelin, took omeprazole.   ROV 09/18/13 -- COPD, also w hx of IBS/colitis, allergic rhinitis w cough. She was treated a couple months ago with pred + abx for a possible AE. She is off spiriva, is using ipratropium 3-4x a day. Currently experiencing cough and clear mucous. She is not on fluticasone, astelin or zyrtec. Not on omeprazole  ROV 01/07/14 -- COPD, also w hx of IBS/colitis, allergic rhinitis w cough. Unfortunately since last time she fractured her L hip and it was repaired, did rehab and completed. Now back home. She was just started on pred for lower back pain by Dr Alusio's office.  She is doing nasal steroid and atrovent nasal spray.  She is on atrovent qid, xopenex prn.   ROV 02/26/14 -- COPD, also w hx of IBS/colitis, allergic rhinitis w cough. Last  time we stopped atrovent nebs and started Anoro. Her breathing has benefited significantly. No coughing or wheezing. Uses xopenex about once every several weeks.    Objective:   Physical Exam Filed Vitals:   02/26/14 1057  BP: 122/86  Pulse: 100  Height: 5' 5"  (1.651 m)  Weight: 188 lb (85.276 kg)  SpO2: 95%   Gen: Pleasant, overwt, in no distress,  normal affect  ENT: No lesions,  mouth clear,  oropharynx clear, no postnasal drip  Neck: No JVD, no TMG, no carotid bruits  Lungs: No use of accessory muscles, slight wheeze on forced expiration.   Cardiovascular: RRR, heart sounds normal, no murmur or gallops, no peripheral edema  Musculoskeletal: No deformities, no cyanosis or clubbing  Neuro: alert, non focal  Skin: Warm, no lesions or rashes   Assessment & Plan:  COPD Significant improvement on the Anoro. Will continue this + xopenex prn. rov in 6 months

## 2014-03-08 ENCOUNTER — Telehealth: Payer: Self-pay | Admitting: Emergency Medicine

## 2014-03-08 DIAGNOSIS — J449 Chronic obstructive pulmonary disease, unspecified: Secondary | ICD-10-CM

## 2014-03-08 MED ORDER — UMECLIDINIUM-VILANTEROL 62.5-25 MCG/INH IN AEPB: 1.0000 | INHALATION_SPRAY | Freq: Every day | RESPIRATORY_TRACT | Status: DC

## 2014-03-08 NOTE — Telephone Encounter (Signed)
Called spoke with pt. [facility] and also a sample. I have done so on both. Nothing further needed

## 2014-03-17 ENCOUNTER — Ambulatory Visit (INDEPENDENT_AMBULATORY_CARE_PROVIDER_SITE_OTHER): Payer: Medicare Other | Admitting: *Deleted

## 2014-03-17 DIAGNOSIS — I4891 Unspecified atrial fibrillation: Secondary | ICD-10-CM

## 2014-03-17 DIAGNOSIS — Z5181 Encounter for therapeutic drug level monitoring: Secondary | ICD-10-CM

## 2014-03-17 LAB — POCT INR: INR: 3

## 2014-03-30 ENCOUNTER — Other Ambulatory Visit (HOSPITAL_COMMUNITY): Payer: Self-pay | Admitting: Internal Medicine

## 2014-03-30 DIAGNOSIS — M8580 Other specified disorders of bone density and structure, unspecified site: Secondary | ICD-10-CM

## 2014-04-01 ENCOUNTER — Ambulatory Visit (HOSPITAL_COMMUNITY)
Admission: RE | Admit: 2014-04-01 | Discharge: 2014-04-01 | Disposition: A | Discharge status: Home / Self Care | Payer: Medicare Other | Source: Ambulatory Visit | Attending: Internal Medicine | Admitting: Internal Medicine

## 2014-04-01 DIAGNOSIS — M8580 Other specified disorders of bone density and structure, unspecified site: Secondary | ICD-10-CM

## 2014-04-01 DIAGNOSIS — M949 Disorder of cartilage, unspecified: Principal | ICD-10-CM

## 2014-04-01 DIAGNOSIS — M899 Disorder of bone, unspecified: Secondary | ICD-10-CM | POA: Insufficient documentation

## 2014-04-13 ENCOUNTER — Other Ambulatory Visit: Payer: Self-pay

## 2014-04-14 ENCOUNTER — Ambulatory Visit (INDEPENDENT_AMBULATORY_CARE_PROVIDER_SITE_OTHER): Payer: Medicare Other | Admitting: *Deleted

## 2014-04-14 DIAGNOSIS — I4891 Unspecified atrial fibrillation: Secondary | ICD-10-CM

## 2014-04-14 DIAGNOSIS — Z5181 Encounter for therapeutic drug level monitoring: Secondary | ICD-10-CM

## 2014-04-14 LAB — POCT INR: INR: 2

## 2014-04-19 ENCOUNTER — Telehealth: Payer: Self-pay | Admitting: Cardiology

## 2014-04-19 NOTE — Telephone Encounter (Signed)
May stop warfarin and start Xarelto when INR < 3.

## 2014-04-19 NOTE — Telephone Encounter (Signed)
Pt advised OK to change from coumadin to Xarelto when INR <3.   Pt states she has about 10 days of coumadin left and that I am going to forward this message to [place] to help coordinate this change.

## 2014-04-19 NOTE — Telephone Encounter (Signed)
Spoke with pt.  Appt made to transition pt over.Marland Kitchen

## 2014-04-19 NOTE — Telephone Encounter (Signed)
New message     Pt want to return to xarelto---she is tired of coumadin

## 2014-04-19 NOTE — Telephone Encounter (Signed)
Pt requesting to change back to Xarelto from coumadin. She states she initially changed to coumadin from Xarelto because Xarelto was too expensive and she was on Multaq at that time.   She is no longer on Multaq and would like to change back to Xarelto from coumadin because of convenience.  I will forward to Dr Aundra Dubin for review.

## 2014-04-21 ENCOUNTER — Ambulatory Visit (INDEPENDENT_AMBULATORY_CARE_PROVIDER_SITE_OTHER): Payer: Medicare Other | Admitting: *Deleted

## 2014-04-21 DIAGNOSIS — I4891 Unspecified atrial fibrillation: Secondary | ICD-10-CM

## 2014-04-21 DIAGNOSIS — Z5181 Encounter for therapeutic drug level monitoring: Secondary | ICD-10-CM

## 2014-04-21 LAB — POCT INR: INR: 2.2

## 2014-04-21 MED ORDER — RIVAROXABAN 20 MG PO TABS: 20.0000 mg | ORAL_TABLET | Freq: Every day | ORAL | Status: DC

## 2014-04-21 NOTE — Patient Instructions (Addendum)
Pt was restarted on Xarelto for atrial fib on 04/21/14.    Reviewed patients medication list.  Pt is not currently on any combined P-gp and strong CYP3A4 inhibitors/inducers (ketoconazole, traconazole, ritonavir, carbamazepine, phenytoin, rifampin, St. John's wort).  Reviewed labs 05/03/14 :  SCr 0.86 Weight 188  CrCl 71.41 .  Dose is appropriate based on CrCl.  On 03/22/14: Hgb and HCT was 15.6/45.7  A full discussion of the nature of anticoagulants has been carried out.  A benefit/risk analysis has been presented to the patient, so that they understand the justification for choosing anticoagulation with Xarelto at this time.  The need for compliance is stressed.  Pt is aware to take the medication once daily with the largest meal of the day.  Side effects of potential bleeding are discussed, including unusual colored urine or stools, coughing up blood or coffee ground emesis, nose bleeds or serious fall or head trauma.  Discussed signs and symptoms of stroke. The patient should avoid any OTC items containing aspirin or ibuprofen.  Avoid alcohol consumption.   Call if any signs of abnormal bleeding.  Discussed financial obligations and resolved any difficulty in obtaining medication.  Next lab test test in 1 months.

## 2014-05-03 ENCOUNTER — Encounter (HOSPITAL_COMMUNITY)
Admission: RE | Admit: 2014-05-03 | Discharge: 2014-05-03 | Disposition: A | Discharge status: Home / Self Care | Payer: Medicare Other | Source: Ambulatory Visit | Attending: Internal Medicine | Admitting: Internal Medicine

## 2014-05-03 DIAGNOSIS — M810 Age-related osteoporosis without current pathological fracture: Secondary | ICD-10-CM | POA: Insufficient documentation

## 2014-05-03 DIAGNOSIS — M84453A Pathological fracture, unspecified femur, initial encounter for fracture: Secondary | ICD-10-CM | POA: Diagnosis not present

## 2014-05-03 LAB — COMPREHENSIVE METABOLIC PANEL
ALBUMIN: 3.8 g/dL (ref 3.5–5.2)
ALK PHOS: 77 U/L (ref 39–117)
ALT: 15 U/L (ref 0–35)
ANION GAP: 11 (ref 5–15)
AST: 16 U/L (ref 0–37)
BUN: 11 mg/dL (ref 6–23)
CO2: 28 mEq/L (ref 19–32)
Calcium: 9.5 mg/dL (ref 8.4–10.5)
Chloride: 100 mEq/L (ref 96–112)
Creatinine, Ser: 0.86 mg/dL (ref 0.50–1.10)
GFR calc non Af Amer: 63 mL/min — ABNORMAL LOW (ref >90)
GFR, EST AFRICAN AMERICAN: 73 mL/min — AB (ref >90)
Glucose, Bld: 113 mg/dL — ABNORMAL HIGH (ref 70–99)
POTASSIUM: 4.6 meq/L (ref 3.7–5.3)
SODIUM: 139 meq/L (ref 137–147)
Total Bilirubin: 0.7 mg/dL (ref 0.3–1.2)
Total Protein: 7.4 g/dL (ref 6.0–8.3)

## 2014-05-03 MED ORDER — DENOSUMAB 60 MG/ML ~~LOC~~ SOLN
60.0000 mg | Freq: Once | SUBCUTANEOUS | Status: AC
  Administered 2014-05-03: 60 mg via SUBCUTANEOUS
  Filled 2014-05-03: qty 1

## 2014-05-03 NOTE — Progress Notes (Addendum)
Pt arrived for 1st dose prolia. Information sheet given to pt regarding med. Tolerated Hays injection well. Labs faxed to Dr Willey Blade.

## 2014-05-26 ENCOUNTER — Ambulatory Visit (INDEPENDENT_AMBULATORY_CARE_PROVIDER_SITE_OTHER): Payer: Medicare Other | Admitting: *Deleted

## 2014-05-26 DIAGNOSIS — I4891 Unspecified atrial fibrillation: Secondary | ICD-10-CM

## 2014-05-26 DIAGNOSIS — Z5181 Encounter for therapeutic drug level monitoring: Secondary | ICD-10-CM

## 2014-05-26 NOTE — Patient Instructions (Addendum)
Pt here for 1 month Xarelto follow-up:  Pt was restarted on Xarelto for atrial fib on 04/21/14.   Reviewed patients medication list. Pt is not currently on any combined P-gp and strong CYP3A4 inhibitors/inducers (ketoconazole, traconazole, ritonavir, carbamazepine, phenytoin, rifampin, St. John's wort). Reviewed labs 06/09/14 : SCr 1.0  Weight 186 CrCl 60.76. Dose is appropriate based on CrCl.  Hgb and HCT: 14.5/43.4  A full discussion of the nature of anticoagulants has been carried out. A benefit/risk analysis has been presented to the patient, so that they understand the justification for choosing anticoagulation with Xarelto at this time. The need for compliance is stressed. Pt is aware to take the medication once daily with the largest meal of the day. Side effects of potential bleeding are discussed, including unusual colored urine or stools, coughing up blood or coffee ground emesis, nose bleeds or serious fall or head trauma. Discussed signs and symptoms of stroke. The patient should avoid any OTC items containing aspirin or ibuprofen. Avoid alcohol consumption. Call if any signs of abnormal bleeding. Discussed financial obligations and resolved any difficulty in obtaining medication. Next lab test test in 6 months.

## 2014-06-08 ENCOUNTER — Encounter: Payer: Self-pay | Admitting: *Deleted

## 2014-06-09 ENCOUNTER — Ambulatory Visit (INDEPENDENT_AMBULATORY_CARE_PROVIDER_SITE_OTHER): Payer: Medicare Other | Admitting: Cardiology

## 2014-06-09 ENCOUNTER — Encounter: Payer: Self-pay | Admitting: Cardiology

## 2014-06-09 VITALS — BP 110/60 | HR 98 | Ht 65.0 in | Wt 178.0 lb

## 2014-06-09 DIAGNOSIS — I4891 Unspecified atrial fibrillation: Secondary | ICD-10-CM

## 2014-06-09 DIAGNOSIS — Z5181 Encounter for therapeutic drug level monitoring: Secondary | ICD-10-CM

## 2014-06-09 DIAGNOSIS — I480 Paroxysmal atrial fibrillation: Secondary | ICD-10-CM

## 2014-06-09 LAB — CBC WITH DIFFERENTIAL/PLATELET
BASOS PCT: 0.4 % (ref 0.0–3.0)
Basophils Absolute: 0 10*3/uL (ref 0.0–0.1)
Eosinophils Absolute: 0.1 10*3/uL (ref 0.0–0.7)
Eosinophils Relative: 1.5 % (ref 0.0–5.0)
HEMATOCRIT: 43.4 % (ref 36.0–46.0)
HEMOGLOBIN: 14.5 g/dL (ref 12.0–15.0)
LYMPHS ABS: 1.9 10*3/uL (ref 0.7–4.0)
LYMPHS PCT: 19.6 % (ref 12.0–46.0)
MCHC: 33.4 g/dL (ref 30.0–36.0)
MCV: 88.3 fl (ref 78.0–100.0)
MONOS PCT: 7.1 % (ref 3.0–12.0)
Monocytes Absolute: 0.7 10*3/uL (ref 0.1–1.0)
Neutro Abs: 6.9 10*3/uL (ref 1.4–7.7)
Neutrophils Relative %: 71.4 % (ref 43.0–77.0)
Platelets: 249 10*3/uL (ref 150.0–400.0)
RBC: 4.92 Mil/uL (ref 3.87–5.11)
RDW: 14.4 % (ref 11.5–15.5)
WBC: 9.7 10*3/uL (ref 4.0–10.5)

## 2014-06-09 LAB — BASIC METABOLIC PANEL
BUN: 20 mg/dL (ref 6–23)
CALCIUM: 9.1 mg/dL (ref 8.4–10.5)
CHLORIDE: 102 meq/L (ref 96–112)
CO2: 27 meq/L (ref 19–32)
CREATININE: 1 mg/dL (ref 0.4–1.2)
GFR: 59.57 mL/min — ABNORMAL LOW (ref >60.00)
GLUCOSE: 92 mg/dL (ref 70–99)
Potassium: 4.1 mEq/L (ref 3.5–5.1)
Sodium: 136 mEq/L (ref 135–145)

## 2014-06-09 NOTE — Patient Instructions (Signed)
Your physician recommends that you have  lab work today--CBCd  Your physician wants you to follow-up in: 6 months with Dr Aundra Dubin. (March 2016). You will receive a reminder letter in the mail two months in advance. If you don't receive a letter, please call our office to schedule the follow-up appointment.

## 2014-06-10 NOTE — Progress Notes (Signed)
Patient ID: [name], female   DOB: 10-05-1933, 80 y.o.   MRN: [id number] PCP: Dr. Willey Blade  80 yo with history of psoriatic arthritis, lymphocytic colitis, paroxysmal atrial fibrillation, and COPD returns for cardiology followup. She had a normal Lexiscan Myoview in 7/13 for atypical chest pain.   Since her last visit, she has not had tachypalpitations concerning for recurrent atrial fibrillation.  She is no longer taking dronedarone due to expense.  No chest pain.  She is chronically short of breath after walking 100-200 feet from COPD.  She is in NSR today.  She had a mechanical fall in 3/15 and fractured her left hip.  She had intramedullary nailing.  Since then, she has been walking with a cane and has had no further falls. She is taking Xarelto with no BRBPR or melena.   Labs (12/13): TnI negative x 2, TSH normal, LDL 59 Labs (7/14): K 3.5, creatinine 0.79 Labs (9/14): HCT 41.7 Labs (7/15): K 4.6, creatinine 0.86, hgb 11.3  ECG: NSR, low volts  PMH: 1. Lymphocytic colitis 2. Hypothyroidism 3. Psoriatic arthritis with h/o MTX use.  4. OA s/p TKR 5. COPD with h/o exacerbations.  Prior smoker.  6. GERD with hiatal hernia 7. CCY 8. Depression 9. Chest pain: Lexiscan myoview (12/13) with EF 75%, apical thinning but no ischemia or infarction.  10. Paroxysmal atrial fibrillation 11. Hip fracture 3/15 s/p repair 12. Peripheral arterial dopplers 3/15: Normal ABIs.   SH: Former smoker, quit in 1982, lives alone.   FH: Father with PCM, brother with MI.   ROS: All systems reviewed and negative except as per HPI.    Current Outpatient Prescriptions  Medication Sig Dispense Refill  . acetaminophen (TYLENOL) 325 MG tablet Take 2 tablets (650 mg total) by mouth every 6 (six) hours as needed (or Fever >/= 101).  60 tablet  0  . ALPRAZolam (XANAX) 0.5 MG tablet Take 0.5-1 tablets (0.25-0.5 mg total) by mouth at bedtime as needed. Sleep  30 tablet  0  . Azelastine HCl 0.15 % SOLN Place 2  sprays into the nose 2 (two) times daily. For allergic rhinitis      . clobetasol cream (TEMOVATE) 0.05 %       . diltiazem (CARDIZEM CD) 120 MG 24 hr capsule Take 120 mg by mouth daily. For HTN      . diphenoxylate-atropine (LOMOTIL) 2.5-0.025 MG per tablet Take one tablet by mouth three times daily as needed for diarrhea  90 tablet  5  . fluticasone (FLONASE) 50 MCG/ACT nasal spray Place 2 sprays into both nostrils 2 (two) times daily. For nasal congestion      . gabapentin (NEURONTIN) 100 MG capsule 4 (four) times daily.       Marland Kitchen levalbuterol (XOPENEX HFA) 45 MCG/ACT inhaler Inhale 1-2 puffs into the lungs as needed for wheezing or shortness of breath. Wheezing      . levothyroxine (SYNTHROID, LEVOTHROID) 125 MCG tablet Take 125 mcg by mouth every morning. For thyroid therapy      . methylcellulose (ARTIFICIAL TEARS) 1 % ophthalmic solution Place 1 drop into both eyes 2 (two) times daily as needed. Dry eyes      . oxyCODONE-acetaminophen (PERCOCET/ROXICET) 5-325 MG per tablet Take 1-2 tablets by mouth every 3 (three) hours as needed for moderate pain.  30 tablet  0  . rivaroxaban (XARELTO) 20 MG TABS tablet Take 1 tablet (20 mg total) by mouth daily with supper.  90 tablet  3  . Umeclidinium-Vilanterol (  ANORO ELLIPTA) 62.5-25 MCG/INH AEPB Inhale 1 puff into the lungs daily.  90 each  3  . [DISCONTINUED] Calcium Carbonate (CALCIUM 600) 1500 MG TABS Take 2 tablets by mouth daily.         No current facility-administered medications for this visit.    BP 110/60  Pulse 98  Ht 5' 5"  (1.651 m)  Wt 178 lb (80.74 kg)  BMI 29.62 kg/m2 General: NAD Neck: No JVD, no thyromegaly or thyroid nodule.  Lungs: Prolonged respiratory phase CV: Nondisplaced PMI.  Heart regular S1/S2, no S3/S4, no murmur.  No peripheral edema.  No carotid bruit.  Unable to palpate pedal pulses.  Abdomen: Soft, nontender, no hepatosplenomegaly, no distention.   Neurologic: Alert and oriented x 3.  Psych: Normal  affect. Extremities: No clubbing or cyanosis.   Assessment/Plan: 1. Chest pain: Atypical with one prolonged episode at rest in 12/13.  No further chest pain.  Risk factors include family history (brother with MI) and prior smoking.  Lexiscan myoview was normal in 12/13.    2. Atrial fibrillation: Paroxysmal atrial fibrillation, quite symptomatic when it occurs (ER visits).  She is in NSR today on diltiazem CD and Xarelto.  She is now off dronedarone due to expense but has had no tachypalpitations.  - If she has symptomatic recurrence of atrial fibrillation, flecainide would be an option for rhythm control but would need ischemic workup.  - Will check CBC today given Xarelto use.  [name] 06/10/2014

## 2014-07-23 ENCOUNTER — Other Ambulatory Visit (HOSPITAL_COMMUNITY): Payer: Self-pay | Admitting: Internal Medicine

## 2014-07-23 DIAGNOSIS — M545 Low back pain, unspecified: Secondary | ICD-10-CM

## 2014-07-25 ENCOUNTER — Other Ambulatory Visit: Payer: Self-pay | Admitting: Emergency Medicine

## 2014-07-28 ENCOUNTER — Other Ambulatory Visit: Payer: Self-pay | Admitting: Cardiology

## 2014-07-28 ENCOUNTER — Ambulatory Visit (HOSPITAL_COMMUNITY)
Admission: RE | Admit: 2014-07-28 | Discharge: 2014-07-28 | Disposition: A | Discharge status: Home / Self Care | Payer: Medicare Other | Source: Ambulatory Visit | Attending: Internal Medicine | Admitting: Internal Medicine

## 2014-07-28 DIAGNOSIS — M545 Low back pain, unspecified: Secondary | ICD-10-CM

## 2014-07-28 DIAGNOSIS — M47816 Spondylosis without myelopathy or radiculopathy, lumbar region: Secondary | ICD-10-CM | POA: Diagnosis not present

## 2014-07-28 DIAGNOSIS — M79662 Pain in left lower leg: Secondary | ICD-10-CM | POA: Insufficient documentation

## 2014-07-28 DIAGNOSIS — R262 Difficulty in walking, not elsewhere classified: Secondary | ICD-10-CM | POA: Diagnosis not present

## 2014-07-28 DIAGNOSIS — M5127 Other intervertebral disc displacement, lumbosacral region: Secondary | ICD-10-CM | POA: Diagnosis not present

## 2014-07-28 DIAGNOSIS — M4806 Spinal stenosis, lumbar region: Secondary | ICD-10-CM | POA: Diagnosis not present

## 2014-08-03 ENCOUNTER — Telehealth: Payer: Self-pay | Admitting: *Deleted

## 2014-08-03 NOTE — Telephone Encounter (Signed)
Given 4 bottles xarelto 20 mg  Pt aware,will pick up tomorrow

## 2014-08-03 NOTE — Telephone Encounter (Signed)
xeralto 20 mg samples

## 2014-08-26 ENCOUNTER — Ambulatory Visit (INDEPENDENT_AMBULATORY_CARE_PROVIDER_SITE_OTHER): Payer: Medicare Other | Admitting: Emergency Medicine

## 2014-08-26 ENCOUNTER — Encounter: Payer: Self-pay | Admitting: Emergency Medicine

## 2014-08-26 VITALS — BP 122/64 | HR 90 | Ht 65.0 in | Wt 180.0 lb

## 2014-08-26 DIAGNOSIS — J449 Chronic obstructive pulmonary disease, unspecified: Secondary | ICD-10-CM

## 2014-08-26 NOTE — Patient Instructions (Signed)
Please continue Anoro as you have been taking it Try using Xopenex 2 puffs if needed for changes in your breathing. I would like to see if this helps when you're having problems Call our office if you need a new Xopenex prescription Follow with Dr Lamonte Sakai in 6 months or sooner if you have any problems

## 2014-08-26 NOTE — Progress Notes (Signed)
Subjective:    Patient ID: [name], female    DOB: January 07, 1933, 80 y.o.   MRN: [id number] HPI 80 yo former smoker (20 pk-yrs), hx of lymphocytic colitis/IBS on prednisone, hypothyroidism, psoriatic arthritis formerly on MTX, OA s/p knee replacement. Presents for evaluation of slowly progressive dyspnea. Bothers her with walking, climbing stairs. Also mentions frequent bouts of bronchitis with apparent associated bronchospasm. Had an acute bronchitis in 2/11, treated with pred + abx + SABA (xopenex due to tachycardia with albuterol). Then started on Spiriva. Now better but still SOB with exertion.   ROV 01/31/10 -- returns for f/u and after PFTs. Feels that she has benefitted from the Spiriva, her exertional symptoms and nighttime symptoms are much improved. Has had PFTs as below.   ROV 08/09/10 -- f/u for COPD (moderate AFL on spiro w no BD response). She was on Spiriva and felt that it helped her breathing, but it bothered her UA - made it dry, hoarse, throat burning. No allergy symptoms. She has been taking the xopenex. C/o some soreness on tongue, ? thrush.   ROV 10/17/10 -- moderate COPD, had been off pred for IBS/colitis since our last visit, just restarted by Dr Henrene Pastor. She doesn't think her breathing worsened off the pred. Tried Symbicort but thout the Spiriva helped her breathing more. She didn't have any side effects from the Symbicort. She is interested in retrying the Spiriva to see if she can tolerate. No flares or exacerbations since last visit. Rare xopenex use.  ROV 02/15/11 -- returns for COPD. We have tried both Symbicort and Spiriva. Last time we went back on Spiriva  - she believes that it has helped, but she is having some hoarse voice. Believes that it is due to Spiriva + a worsening of her allergies. Started on zyrtec in March with some relief but still bothering her. Since last visit Prednisone has been restarted. Had emergency sgy for cholecystitis in February. Having GERD sx  at night when she lays down.   ROV 06/19/11 -- follow up for COPD. She is tolerating the spiriva. Her prednisone was weaned to off in June, hasn't been restarted, she has f/u w Dr Henrene Pastor today. She rarely uses xopenex. No flares since last visit. She does still have some exertional sob.   ROV 12/18/11 -- COPD, also w hx of IBS/colitis followed by Dr Henrene Pastor. Currently on Spiriva, has xopenex to use prn - uses rarely. Still gets SOB with walking or with stairs. She thinks Spiriva helps but it also can make her throat hoarse. Not on anti-inflammatory meds at this time.   ROV 06/17/12 -- COPD, also w hx of IBS/colitis followed by Dr Henrene Pastor. She is using Spiriva. She has had more trouble with allergies and associated cough over the last several months. Not currently on zyrtec. She did benefit from chlorpheniramine. Rare SABA use.   07/28/2012 Acute OV  Complains of dry cough, chest congestion, increased SOB, wheezing, tightness in chest, head congestion w/ clear drainage, PND x4 days .  Has upcoming surgery next week - redo of knee replacement w/ ortho No hemoptysis or chest pain. No edema.  No recent abx use.  >>Omnicef x 7 d   Acute OV 08/04/12 --  Patient returns for persistent cough and congestion. The patient was seen 1 week ago for bronchitis and treated with 7 days of Omnicef. She has upcoming surgery on October 30 for a knee replacement She states that she is feeling much better but still has some  sinus drainage, congestion, and cough. She denies any discolored mucus or fever or chest pain, shortness of breath, or edema CXR last week w/ no acute process    ROV 12/25/12 -- COPD, also w hx of IBS/colitis followed by Dr Henrene Pastor. She returns for regular f/u. Treated in Oct 13 for AE as above. She is starting gto have allergy sx for last 2 weeks, bad at night. She is back on her zyrtec. She believes that her breathing is a bit more limiting than a year ago, SOB w exertion and with lifting objects. She  has tried symbicort before.    PULMONARY FUNCTON TEST 01/18/2010  FVC 2.54  FEV1 1.19  FEV1/FVC 46.9  FVC  % Predicted 97  FEV % Predicted 65  FeF 25-75 .37  FeF 25-75 % Predicted 2.11      ROV 02/05/13 -- COPD, also w hx of IBS/colitis, allergic rhinitis w cough. Last time we trialed Breo qd in addition to Spiriva. She isn't sure that the Memory Dance has been beneficial. Her breathing is similar. She has had congestion and allergic sx for the last few days. She restarted zyrtec 5-6 days ago. She has chlor-trimeton but hasn't used yet.   ROV 02/20/13 -- COPD, also w hx of IBS/colitis, allergic rhinitis w cough. Last time added fluticasone to zyrtec, asked her to try chlorpheniramine. Stopped Breo (no effect), continued Spiriva. She is having chest tightness "heaviness" that can go to her back. Some exertional dyspnea. The tightness in her chest has been of/on for a year. Her chest congestion is better, clear nasal drainage.   ROV 03/20/13 -- COPD, also w hx of IBS/colitis, allergic rhinitis w cough. She was having chest discomfort and cough last time - added omeprazole, also added astelin to fluticasone and zyrtec. She is much better. She took the astelin, took omeprazole.   ROV 09/18/13 -- COPD, also w hx of IBS/colitis, allergic rhinitis w cough. She was treated a couple months ago with pred + abx for a possible AE. She is off spiriva, is using ipratropium 3-4x a day. Currently experiencing cough and clear mucous. She is not on fluticasone, astelin or zyrtec. Not on omeprazole  ROV 01/07/14 -- COPD, also w hx of IBS/colitis, allergic rhinitis w cough. Unfortunately since last time she fractured her L hip and it was repaired, did rehab and completed. Now back home. She was just started on pred for lower back pain by Dr Alusio's office.  She is doing nasal steroid and atrovent nasal spray.  She is on atrovent qid, xopenex prn.   ROV 02/26/14 -- COPD, also w hx of IBS/colitis, allergic rhinitis w cough. Last  time we stopped atrovent nebs and started Anoro. Her breathing has benefited significantly. No coughing or wheezing. Uses xopenex about once every several weeks.   ROV 08/26/14 -- follow-up visit for COPD, rhinitis with cough. She describes some increased SOB beginning about 2 weeks ago, some mid chest tightness. Only happens with activity. She was started on doxy recently for an eye infection - hasn't improved / changed her breathing. No wheeze today, no cough. Rarely uses xopenex.    Objective:   Physical Exam Filed Vitals:   08/26/14 1455  BP: 122/64  Pulse: 90  Height: 5' 5"  (1.651 m)  Weight: 180 lb (81.647 kg)  SpO2: 96%   Gen: Pleasant, overwt, in no distress,  normal affect  ENT: No lesions,  mouth clear,  oropharynx clear, no postnasal drip  Neck: No JVD, no TMG,  no carotid bruits  Lungs: No use of accessory muscles, no wheezes  Cardiovascular: RRR, heart sounds normal, no murmur or gallops, no peripheral edema  Musculoskeletal: No deformities, no cyanosis or clubbing  Neuro: alert, non focal  Skin: Warm, no lesions or rashes   Assessment & Plan:  COPD (chronic obstructive pulmonary disease) Overall doing well although for the last 2 weeks she has had some increased dyspnea. Not appear to have an acute exacerbation and her rhinitis has been bothersome but stable. I have asked her to try using her Xopenex when necessary to see if this will change her breathing and give [place] some insight into whether this is related to obstructive disease.. Flu shot and pneumonia vaccination are up-to-date

## 2014-08-26 NOTE — Assessment & Plan Note (Signed)
Overall doing well although for the last 2 weeks she has had some increased dyspnea. Not appear to have an acute exacerbation and her rhinitis has been bothersome but stable. I have asked her to try using her Xopenex when necessary to see if this will change her breathing and give [place] some insight into whether this is related to obstructive disease.. Flu shot and pneumonia vaccination are up-to-date

## 2014-11-01 ENCOUNTER — Encounter (HOSPITAL_COMMUNITY): Payer: Medicare Other

## 2014-11-01 ENCOUNTER — Encounter (HOSPITAL_COMMUNITY)
Admission: RE | Admit: 2014-11-01 | Discharge: 2014-11-01 | Disposition: A | Discharge status: Home / Self Care | Payer: Medicare Other | Source: Ambulatory Visit | Attending: Internal Medicine | Admitting: Internal Medicine

## 2014-11-03 ENCOUNTER — Other Ambulatory Visit: Payer: Self-pay

## 2014-11-03 MED ORDER — DILTIAZEM HCL ER COATED BEADS 120 MG PO CP24: 120.0000 mg | ORAL_CAPSULE | Freq: Every day | ORAL | Status: DC

## 2014-11-15 ENCOUNTER — Encounter (HOSPITAL_COMMUNITY)
Admission: RE | Admit: 2014-11-15 | Discharge: 2014-11-15 | Disposition: A | Discharge status: Home / Self Care | Payer: Medicare Other | Source: Ambulatory Visit | Attending: Internal Medicine | Admitting: Internal Medicine

## 2014-11-15 DIAGNOSIS — M84453A Pathological fracture, unspecified femur, initial encounter for fracture: Secondary | ICD-10-CM | POA: Insufficient documentation

## 2014-11-15 DIAGNOSIS — M810 Age-related osteoporosis without current pathological fracture: Secondary | ICD-10-CM | POA: Insufficient documentation

## 2014-11-15 LAB — COMPREHENSIVE METABOLIC PANEL
ALT: 15 U/L (ref 0–35)
AST: 18 U/L (ref 0–37)
Albumin: 3.9 g/dL (ref 3.5–5.2)
Alkaline Phosphatase: 54 U/L (ref 39–117)
Anion gap: 4 — ABNORMAL LOW (ref 5–15)
BILIRUBIN TOTAL: 1.1 mg/dL (ref 0.3–1.2)
BUN: 18 mg/dL (ref 6–23)
CO2: 28 mmol/L (ref 19–32)
CREATININE: 0.94 mg/dL (ref 0.50–1.10)
Calcium: 8.9 mg/dL (ref 8.4–10.5)
Chloride: 106 mmol/L (ref 96–112)
GFR calc non Af Amer: 56 mL/min — ABNORMAL LOW (ref >90)
GFR, EST AFRICAN AMERICAN: 65 mL/min — AB (ref >90)
GLUCOSE: 101 mg/dL — AB (ref 70–99)
Potassium: 4.2 mmol/L (ref 3.5–5.1)
Sodium: 138 mmol/L (ref 135–145)
Total Protein: 7.4 g/dL (ref 6.0–8.3)

## 2014-11-15 LAB — MAGNESIUM: MAGNESIUM: 2 mg/dL (ref 1.5–2.5)

## 2014-11-15 LAB — PHOSPHORUS: Phosphorus: 3.7 mg/dL (ref 2.3–4.6)

## 2014-11-15 MED ORDER — DENOSUMAB 60 MG/ML ~~LOC~~ SOLN
60.0000 mg | Freq: Once | SUBCUTANEOUS | Status: AC
  Administered 2014-11-15: 60 mg via SUBCUTANEOUS
  Filled 2014-11-15: qty 1

## 2014-11-15 NOTE — Progress Notes (Signed)
Results for [name], [name] (MRN [id number]) as of 11/15/2014 13:53  Ref. Range 11/15/2014 12:28  Sodium Latest Range: 135-145 mmol/L 138  Potassium Latest Range: 3.5-5.1 mmol/L 4.2  Chloride Latest Range: 96-112 mmol/L 106  CO2 Latest Range: 19-32 mmol/L 28  BUN Latest Range: 6-23 mg/dL 18  Creatinine Latest Range: 0.50-1.10 mg/dL 0.94  Calcium Latest Range: 8.4-10.5 mg/dL 8.9  GFR calc non Af Amer Latest Range: >90 mL/min 56 (L)  GFR calc Af Amer Latest Range: >90 mL/min 65 (L)  Glucose Latest Range: 70-99 mg/dL 101 (H)  Anion gap Latest Range: 5-15  4 (L)  Phosphorus Latest Range: 2.3-4.6 mg/dL 3.7  Magnesium Latest Range: 1.5-2.5 mg/dL 2.0  Alkaline Phosphatase Latest Range: 39-117 U/L 54  Albumin Latest Range: 3.5-5.2 g/dL 3.9  AST Latest Range: 0-37 U/L 18  ALT Latest Range: 0-35 U/L 15  Total Protein Latest Range: 6.0-8.3 g/dL 7.4  Total Bilirubin Latest Range: 0.3-1.2 mg/dL 1.1

## 2014-11-17 ENCOUNTER — Other Ambulatory Visit: Payer: Self-pay

## 2014-11-17 ENCOUNTER — Telehealth: Payer: Self-pay | Admitting: *Deleted

## 2014-11-17 DIAGNOSIS — I480 Paroxysmal atrial fibrillation: Secondary | ICD-10-CM

## 2014-11-17 MED ORDER — RIVAROXABAN 20 MG PO TABS: 20.0000 mg | ORAL_TABLET | Freq: Every day | ORAL | Status: DC

## 2014-11-17 NOTE — Telephone Encounter (Signed)
Pt is due for 6 month Xarelto follow up.  Lab orders printed out and left at the front desk for her to pick up per her request.  [facility] for pt to make an appt to see me back 3 -7 days after she has lab work done to review labs.

## 2014-11-17 NOTE — Telephone Encounter (Signed)
Pt wants to speak to Edrick Oh and needs lab orders

## 2014-12-04 LAB — CBC
HEMATOCRIT: 45 % (ref 36.0–46.0)
HEMOGLOBIN: 14.9 g/dL (ref 12.0–15.0)
MCH: 30.4 pg (ref 26.0–34.0)
MCHC: 33.1 g/dL (ref 30.0–36.0)
MCV: 91.8 fL (ref 78.0–100.0)
MPV: 10.7 fL (ref 8.6–12.4)
PLATELETS: 245 10*3/uL (ref 150–400)
RBC: 4.9 MIL/uL (ref 3.87–5.11)
RDW: 14.5 % (ref 11.5–15.5)
WBC: 8.3 10*3/uL (ref 4.0–10.5)

## 2014-12-04 LAB — BASIC METABOLIC PANEL
BUN: 14 mg/dL (ref 6–23)
CHLORIDE: 102 meq/L (ref 96–112)
CO2: 27 mEq/L (ref 19–32)
Calcium: 8.9 mg/dL (ref 8.4–10.5)
Creat: 0.77 mg/dL (ref 0.50–1.10)
GLUCOSE: 80 mg/dL (ref 70–99)
POTASSIUM: 4.7 meq/L (ref 3.5–5.3)
Sodium: 137 mEq/L (ref 135–145)

## 2014-12-08 ENCOUNTER — Ambulatory Visit (INDEPENDENT_AMBULATORY_CARE_PROVIDER_SITE_OTHER): Payer: Medicare Other | Admitting: Cardiology

## 2014-12-08 ENCOUNTER — Encounter: Payer: Self-pay | Admitting: Cardiology

## 2014-12-08 VITALS — BP 110/64 | HR 89 | Ht 65.0 in | Wt 183.0 lb

## 2014-12-08 DIAGNOSIS — I480 Paroxysmal atrial fibrillation: Secondary | ICD-10-CM

## 2014-12-08 NOTE — Patient Instructions (Signed)
Your physician wants you to follow-up in: 6 months with Dr Aundra Dubin. (September 2016).  You will receive a reminder letter in the mail two months in advance. If you don't receive a letter, please call our office to schedule the follow-up appointment.

## 2014-12-08 NOTE — Progress Notes (Signed)
Patient ID: [name], female   DOB: 06/02/1933, 81 y.o.   MRN: [id number] PCP: Dr. Willey Blade  81 yo with history of psoriatic arthritis, lymphocytic colitis, paroxysmal atrial fibrillation, and COPD returns for cardiology followup. She had a normal Lexiscan Myoview in 7/13 for atypical chest pain.   Since her last visit, she has not had tachypalpitations concerning for recurrent atrial fibrillation.  She is no longer taking dronedarone due to expense.  No chest pain.  She is chronically short of breath after walking 100-200 feet from COPD.  She is in NSR today.  She had a mechanical fall in 3/15 and fractured her left hip.  She had intramedullary nailing.  Since then, she has been walking with a cane for stability and has had no further falls. She is taking Xarelto with no BRBPR or melena. Low back pain leads to some limitation.   Labs (12/13): TnI negative x 2, TSH normal, LDL 59 Labs (7/14): K 3.5, creatinine 0.79 Labs (9/14): HCT 41.7 Labs (7/15): K 4.6, creatinine 0.86, hgb 11.3 Labs (2/16): K 4.7, creatinine 0.77, HCT 45  ECG: NSR, low volts  PMH: 1. Lymphocytic colitis 2. Hypothyroidism 3. Psoriatic arthritis with h/o MTX use.  4. OA s/p TKR 5. COPD with h/o exacerbations.  Prior smoker.  6. GERD with hiatal hernia 7. CCY 8. Depression 9. Chest pain: Lexiscan myoview (12/13) with EF 75%, apical thinning but no ischemia or infarction.  10. Paroxysmal atrial fibrillation 11. Hip fracture 3/15 s/p repair 12. Peripheral arterial dopplers 3/15: Normal ABIs.  13. Low back pain  SH: Former smoker, quit in 1982, lives alone.   FH: Father with PCM, brother with MI.   ROS: All systems reviewed and negative except as per HPI.    Current Outpatient Prescriptions  Medication Sig Dispense Refill  . acetaminophen (TYLENOL) 325 MG tablet Take 2 tablets (650 mg total) by mouth every 6 (six) hours as needed (or Fever >/= 101). 60 tablet 0  . ALPRAZolam (XANAX) 0.5 MG tablet Take 0.5-1  tablets (0.25-0.5 mg total) by mouth at bedtime as needed. Sleep 30 tablet 0  . azelastine (ASTELIN) 0.1 % nasal spray PLACE 2 SPRAYS IN EACH NOSTRIL TWICE DAILY 30 mL 0  . clobetasol cream (TEMOVATE) 0.05 %     . diltiazem (CARTIA XT) 120 MG 24 hr capsule Take 1 capsule (120 mg total) by mouth daily. 90 capsule 3  . diphenoxylate-atropine (LOMOTIL) 2.5-0.025 MG per tablet Take one tablet by mouth three times daily as needed for diarrhea 90 tablet 5  . doxycycline (VIBRAMYCIN) 100 MG capsule Take 100 mg by mouth 2 (two) times daily.    . fluticasone (FLONASE) 50 MCG/ACT nasal spray Place 2 sprays into both nostrils 2 (two) times daily. For nasal congestion    . gabapentin (NEURONTIN) 100 MG capsule 4 (four) times daily.     Marland Kitchen levalbuterol (XOPENEX HFA) 45 MCG/ACT inhaler Inhale 1-2 puffs into the lungs as needed for wheezing or shortness of breath. Wheezing    . levothyroxine (SYNTHROID, LEVOTHROID) 125 MCG tablet Take 125 mcg by mouth every morning. For thyroid therapy    . methylcellulose (ARTIFICIAL TEARS) 1 % ophthalmic solution Place 1 drop into both eyes 2 (two) times daily as needed. Dry eyes    . oxyCODONE-acetaminophen (PERCOCET/ROXICET) 5-325 MG per tablet Take 1-2 tablets by mouth every 3 (three) hours as needed for moderate pain. 30 tablet 0  . rivaroxaban (XARELTO) 20 MG TABS tablet Take 1 tablet (20 mg  total) by mouth daily with supper. 90 tablet 1  . Umeclidinium-Vilanterol (ANORO ELLIPTA) 62.5-25 MCG/INH AEPB Inhale 1 puff into the lungs daily. 90 each 3  . [DISCONTINUED] Calcium Carbonate (CALCIUM 600) 1500 MG TABS Take 2 tablets by mouth daily.       No current facility-administered medications for this visit.    BP 110/64 mmHg  Pulse 89  Ht 5' 5"  (1.651 m)  Wt 183 lb (83.008 kg)  BMI 30.45 kg/m2 General: NAD Neck: No JVD, no thyromegaly or thyroid nodule.  Lungs: Prolonged respiratory phase CV: Nondisplaced PMI.  Heart regular S1/S2, no S3/S4, no murmur.  No peripheral  edema.  No carotid bruit.  Unable to palpate pedal pulses.  Abdomen: Soft, nontender, no hepatosplenomegaly, no distention.   Neurologic: Alert and oriented x 3.  Psych: Normal affect. Extremities: No clubbing or cyanosis.   Assessment/Plan: 1. Atrial fibrillation: Paroxysmal atrial fibrillation, quite symptomatic when it occurs (ER visits).  She is in NSR today on diltiazem CD and Xarelto.  She is now off dronedarone due to expense but has had no tachypalpitations.  - If she has symptomatic recurrence of atrial fibrillation, flecainide would be an option for rhythm control but would need ischemic workup.  2. COPD: Patient has mild stable dyspnea likely due to COPD.   [name] 12/08/2014

## 2015-01-06 ENCOUNTER — Telehealth: Payer: Self-pay | Admitting: Emergency Medicine

## 2015-01-06 DIAGNOSIS — J449 Chronic obstructive pulmonary disease, unspecified: Secondary | ICD-10-CM

## 2015-01-06 MED ORDER — UMECLIDINIUM-VILANTEROL 62.5-25 MCG/INH IN AEPB: 1.0000 | INHALATION_SPRAY | Freq: Every day | RESPIRATORY_TRACT | Status: DC

## 2015-01-06 NOTE — Telephone Encounter (Signed)
lmtcb x1 

## 2015-01-06 NOTE — Telephone Encounter (Signed)
Rx has been sent in. Pt was made aware. ROV has been scheduled for her 51 month ROV. Nothing further was needed.

## 2015-01-12 ENCOUNTER — Encounter: Payer: Self-pay | Admitting: Internal Medicine

## 2015-02-17 ENCOUNTER — Ambulatory Visit (INDEPENDENT_AMBULATORY_CARE_PROVIDER_SITE_OTHER): Payer: Medicare Other | Admitting: Emergency Medicine

## 2015-02-17 ENCOUNTER — Encounter: Payer: Self-pay | Admitting: Emergency Medicine

## 2015-02-17 VITALS — BP 120/84 | HR 84 | Wt 174.0 lb

## 2015-02-17 DIAGNOSIS — J302 Other seasonal allergic rhinitis: Secondary | ICD-10-CM | POA: Diagnosis not present

## 2015-02-17 DIAGNOSIS — J449 Chronic obstructive pulmonary disease, unspecified: Secondary | ICD-10-CM

## 2015-02-17 MED ORDER — TIOTROPIUM BROMIDE MONOHYDRATE 2.5 MCG/ACT IN AERS: 1.0000 | INHALATION_SPRAY | RESPIRATORY_TRACT | Status: DC

## 2015-02-17 MED ORDER — TIOTROPIUM BROMIDE MONOHYDRATE 2.5 MCG/ACT IN AERS: 2.0000 | INHALATION_SPRAY | Freq: Every day | RESPIRATORY_TRACT | Status: DC

## 2015-02-17 NOTE — Assessment & Plan Note (Signed)
She has been having sores and ulcerations in her nose that likely relate to her nasal sprays. She is on fluticasone and Astelin. I suspect that the Astelin is irritating her mucosa and we will hold this for now. She will continue the fluticasone and see if her side effects improve, hopefully without any loss of control of her rhinitis

## 2015-02-17 NOTE — Addendum Note (Signed)
Addended by: Mathis Bud on: 02/17/2015 12:48 PM   Modules accepted: Orders

## 2015-02-17 NOTE — Assessment & Plan Note (Signed)
She is having more mouth dryness, lip dryness and chapping, since changing to anoro. We will do a trial of switching back to Spiriva Respimat to see if she has fewer side effects.

## 2015-02-17 NOTE — Progress Notes (Signed)
Subjective:    Patient ID: [name], female    DOB: 1933-08-18, 81 y.o.   MRN: [id number] HPI 81 yo former smoker (20 pk-yrs), hx of lymphocytic colitis/IBS on prednisone, hypothyroidism, psoriatic arthritis formerly on MTX, OA s/p knee replacement. Presents for evaluation of slowly progressive dyspnea. Bothers her with walking, climbing stairs. Also mentions frequent bouts of bronchitis with apparent associated bronchospasm. Had an acute bronchitis in 2/11, treated with pred + abx + SABA (xopenex due to tachycardia with albuterol). Then started on Spiriva. Now better but still SOB with exertion.   ROV 01/31/10 -- returns for f/u and after PFTs. Feels that she has benefitted from the Spiriva, her exertional symptoms and nighttime symptoms are much improved. Has had PFTs as below.   ROV 08/09/10 -- f/u for COPD (moderate AFL on spiro w no BD response). She was on Spiriva and felt that it helped her breathing, but it bothered her UA - made it dry, hoarse, throat burning. No allergy symptoms. She has been taking the xopenex. C/o some soreness on tongue, ? thrush.    PULMONARY FUNCTON TEST 01/18/2010  FVC 2.54  FEV1 1.19  FEV1/FVC 46.9  FVC  % Predicted 97  FEV % Predicted 65  FeF 25-75 .37  FeF 25-75 % Predicted 2.11    ROV 01/07/14 -- COPD, also w hx of IBS/colitis, allergic rhinitis w cough. Unfortunately since last time she fractured her L hip and it was repaired, did rehab and completed. Now back home. She was just started on pred for lower back pain by Dr Alusio's office.  She is doing nasal steroid and atrovent nasal spray.  She is on atrovent qid, xopenex prn.   ROV 02/26/14 -- COPD, also w hx of IBS/colitis, allergic rhinitis w cough. Last time we stopped atrovent nebs and started Anoro. Her breathing has benefited significantly. No coughing or wheezing. Uses xopenex about once every several weeks.   ROV 08/26/14 -- follow-up visit for COPD, rhinitis with cough. She describes some  increased SOB beginning about 2 weeks ago, some mid chest tightness. Only happens with activity. She was started on doxy recently for an eye infection - hasn't improved / changed her breathing. No wheeze today, no cough. Rarely uses xopenex.   ROV 02/17/15 -- follow-up visit for COPD, chronic rhinitis associated with cough. She has had trouble with the pollen season - head congestion, throat irritation. She has done fairly well w regard to cough and breathing. She has had some sores in her nose and dry lips. She is on astelin and fluticasone, having some ulcerations in the nose. She wonders if anoro is drying her mouth / lips.    Objective:   Physical Exam Filed Vitals:   02/17/15 1101 02/17/15 1102  BP:  120/84  Pulse:  84  Weight: 174 lb (78.926 kg)   SpO2:  95%   Gen: Pleasant, overwt, in no distress,  normal affect  ENT: No lesions,  mouth clear,  oropharynx clear, no postnasal drip  Neck: No JVD, no TMG, no carotid bruits  Lungs: No use of accessory muscles, no wheezes  Cardiovascular: RRR, heart sounds normal, no murmur or gallops, no peripheral edema  Musculoskeletal: No deformities, no cyanosis or clubbing  Neuro: alert, non focal  Skin: Warm, no lesions or rashes   Assessment & Plan:  COPD (chronic obstructive pulmonary disease) She is having more mouth dryness, lip dryness and chapping, since changing to anoro. We will do a trial of switching back  to Spiriva Respimat to see if she has fewer side effects.     Allergic rhinitis, seasonal She has been having sores and ulcerations in her nose that likely relate to her nasal sprays. She is on fluticasone and Astelin. I suspect that the Astelin is irritating her mucosa and we will hold this for now. She will continue the fluticasone and see if her side effects improve, hopefully without any loss of control of her rhinitis

## 2015-02-17 NOTE — Addendum Note (Signed)
Addended by: Desmond Dike C on: 02/17/2015 11:40 AM   Modules accepted: Orders

## 2015-02-17 NOTE — Patient Instructions (Addendum)
We will stop Anoro for now, go back to Spiriva once a day to see if this helps you mouth dryness.  Stop your astelin  Continue to use flonase 2 sprays each side daily Follow with Dr Lamonte Sakai in 3 months or sooner if you have any problems.

## 2015-03-10 ENCOUNTER — Telehealth: Payer: Self-pay | Admitting: Emergency Medicine

## 2015-03-10 MED ORDER — TIOTROPIUM BROMIDE MONOHYDRATE 2.5 MCG/ACT IN AERS: 2.0000 | INHALATION_SPRAY | Freq: Every day | RESPIRATORY_TRACT | Status: DC

## 2015-03-10 NOTE — Telephone Encounter (Signed)
Rx has been sent in. Pt is aware. Nothing further was needed. 

## 2015-03-15 ENCOUNTER — Ambulatory Visit: Payer: Medicare Other | Admitting: Internal Medicine

## 2015-04-01 ENCOUNTER — Ambulatory Visit (INDEPENDENT_AMBULATORY_CARE_PROVIDER_SITE_OTHER): Payer: Medicare Other | Admitting: Internal Medicine

## 2015-04-01 ENCOUNTER — Encounter: Payer: Self-pay | Admitting: Internal Medicine

## 2015-04-01 VITALS — BP 112/60 | HR 88 | Ht 65.0 in | Wt 178.4 lb

## 2015-04-01 DIAGNOSIS — Z86010 Personal history of colonic polyps: Secondary | ICD-10-CM

## 2015-04-01 DIAGNOSIS — K5289 Other specified noninfective gastroenteritis and colitis: Secondary | ICD-10-CM | POA: Diagnosis not present

## 2015-04-01 DIAGNOSIS — K52832 Lymphocytic colitis: Secondary | ICD-10-CM

## 2015-04-01 DIAGNOSIS — R197 Diarrhea, unspecified: Secondary | ICD-10-CM | POA: Diagnosis not present

## 2015-04-01 NOTE — Patient Instructions (Signed)
Please follow up as needed 

## 2015-04-01 NOTE — Progress Notes (Signed)
HISTORY OF PRESENT ILLNESS:  [name] is a 81 y.o. female with multiple medical problems as listed below including atrial fibrillation for which she is on chronic anticoagulation therapy. She has a history of lymphocytic colitis which she treats with Lomotil. Cannot afford Entocort. She also has a history of adenomatous colon polyp remotely (2000). With negative follow-up colonoscopies in 2005 in 2010 (elsewhere). Colonoscopy year in 2011 to evaluate diarrhea refilled microscopic colitis (lymphocytic version). No polyps. On xeralto for about 2 years now. She continues with diarrhea. Wonders about alternative therapies. GI review of systems is otherwise negative. No abdominal pain, change in bowel habits, or bleeding. No family history of colon cancer.  REVIEW OF SYSTEMS:  All non-GI ROS negative except for ice and allergy trouble, arthritis, back pain, itching, irregular heart rate, skin rash  Past Medical History  Diagnosis Date  . Hypothyroidism   . Psoriatic arthritis   . IBS (irritable bowel syndrome)   . Hemorrhoids   . Diverticulosis   . Rectal polyp   . GERD (gastroesophageal reflux disease)   . Family hx of colon cancer   . Uterine polyp   . COPD (chronic obstructive pulmonary disease)   . Lymphocytic colitis   . A-fib   . Arthritis     psoriatic  . History of shingles   . Osteoporosis     hx -osteopenia  . Psoriasis   . Hiatal hernia     Past Surgical History  Procedure Laterality Date  . Knee surgery      lt.  Marland Kitchen Uterine polypectomy    . Cholecystectomy  Dec 04, 2010  . Total knee revision  08/06/2012    Procedure: TOTAL KNEE REVISION;  Surgeon: [name], MD;  Location: [facility];  Service: Orthopedics;  Laterality: Left;  Left Total Knee Arthroplasty Revision  . Femur im nail Left 12/11/2013    Procedure: INTRAMEDULLARY (IM) NAIL FEMORAL;  Surgeon: [name], MD;  Location: [facility];  Service: Orthopedics;  Laterality: Left;  . Fracture surgery Left  12/11/2013    hip    Social History [name]  reports that she quit smoking about 34 years ago. Her smoking use included Cigarettes. She has a 20 pack-year smoking history. She has never used smokeless tobacco. She reports that she drinks alcohol. She reports that she does not use illicit drugs.  family history includes Colon cancer in her maternal grandfather and mother; Heart disease in her father; Lung cancer in her father.  Allergies  Allergen Reactions  . Codeine Itching       PHYSICAL EXAMINATION: Vital signs: BP 112/60 mmHg  Pulse 88  Ht 5' 5"  (1.651 m)  Wt 178 lb 6 oz (80.91 kg)  BMI 29.68 kg/m2 General: Well-developed, well-nourished, no acute distress Abdomen: Not examined Psychiatric: alert and oriented x3. Cooperative   ASSESSMENT:  #1. Remote history of adenomatous colon polyp. Multiple subsequent negative examinations including 5 years ago. Currently 81 years of age with multiple medical problems. Discussed with her the most current guidelines (2012) regarding surveillance. Colonoscopy is not indicated. She has aged out. She is pleased. #2. Lymphocytic colitis. Ongoing. Discuss treatment options. She will stick with Lomotil  15 minutes spent with the patient today. Greater than 50% of the time used for counseling

## 2015-04-04 ENCOUNTER — Other Ambulatory Visit (HOSPITAL_COMMUNITY): Payer: Self-pay | Admitting: Internal Medicine

## 2015-04-04 DIAGNOSIS — Z1231 Encounter for screening mammogram for malignant neoplasm of breast: Secondary | ICD-10-CM

## 2015-04-22 ENCOUNTER — Ambulatory Visit (HOSPITAL_COMMUNITY): Payer: Medicare Other

## 2015-04-22 ENCOUNTER — Telehealth: Payer: Self-pay | Admitting: Pulmonary Disease

## 2015-04-22 ENCOUNTER — Ambulatory Visit (INDEPENDENT_AMBULATORY_CARE_PROVIDER_SITE_OTHER): Payer: Medicare Other | Admitting: Pulmonary Disease

## 2015-04-22 ENCOUNTER — Encounter: Payer: Self-pay | Admitting: Pulmonary Disease

## 2015-04-22 ENCOUNTER — Ambulatory Visit: Payer: Medicare Other | Admitting: Pulmonary Disease

## 2015-04-22 VITALS — BP 140/80 | HR 96 | Temp 97.8°F | Wt 174.6 lb

## 2015-04-22 DIAGNOSIS — J441 Chronic obstructive pulmonary disease with (acute) exacerbation: Secondary | ICD-10-CM

## 2015-04-22 DIAGNOSIS — I4891 Unspecified atrial fibrillation: Secondary | ICD-10-CM

## 2015-04-22 DIAGNOSIS — Z5181 Encounter for therapeutic drug level monitoring: Secondary | ICD-10-CM

## 2015-04-22 DIAGNOSIS — I480 Paroxysmal atrial fibrillation: Secondary | ICD-10-CM | POA: Diagnosis not present

## 2015-04-22 MED ORDER — METHYLPREDNISOLONE 4 MG PO TBPK: ORAL_TABLET | ORAL | Status: DC

## 2015-04-22 MED ORDER — METHYLPREDNISOLONE ACETATE 80 MG/ML IJ SUSP
80.0000 mg | Freq: Once | INTRAMUSCULAR | Status: AC
  Administered 2015-04-22: 80 mg via INTRAMUSCULAR

## 2015-04-22 MED ORDER — LEVALBUTEROL TARTRATE 45 MCG/ACT IN AERO: 1.0000 | INHALATION_SPRAY | RESPIRATORY_TRACT | Status: DC | PRN

## 2015-04-22 NOTE — Telephone Encounter (Signed)
error 

## 2015-04-22 NOTE — Patient Instructions (Signed)
Today we updated your med list in our EP[facility] system...    Continue your current medications the same...  For your COPD exacerbation>    Finish the ZPak from [facility]...    We gave you a Depo shot & MEDROL Dosepak for the bronchial tube inflammation...  Continue the Spiriva and Xopenex as you are doing...  Call for any questions.Marland KitchenMarland Kitchen

## 2015-04-23 ENCOUNTER — Encounter: Payer: Self-pay | Admitting: Pulmonary Disease

## 2015-04-23 NOTE — Progress Notes (Signed)
Subjective:     Patient ID: [name], female   DOB: [birth year], 81 y.o.   MRN: [id number]  HPI 81 yo former smoker (20 pk-yrs), hx of lymphocytic colitis/IBS on prednisone, hypothyroidism, psoriatic arthritis formerly on MTX, OA s/p knee replacement. Presents for evaluation of slowly progressive dyspnea. Bothers her with walking, climbing stairs. Also mentions frequent bouts of bronchitis with apparent associated bronchospasm. Had an acute bronchitis in 2/11, treated with pred + abx + SABA (xopenex due to tachycardia with albuterol). Then started on Spiriva. Now better but still SOB with exertion.   ROV 01/31/10 -- returns for f/u and after PFTs. Feels that she has benefitted from the Spiriva, her exertional symptoms and nighttime symptoms are much improved. Has had PFTs as below.   ROV 08/09/10 -- f/u for COPD (moderate AFL on spiro w no BD response). She was on Spiriva and felt that it helped her breathing, but it bothered her UA - made it dry, hoarse, throat burning. No allergy symptoms. She has been taking the xopenex. C/o some soreness on tongue, ? thrush.    PULMONARY FUNCTON TEST 01/18/2010  FVC 2.54  FEV1 1.19  FEV1/FVC 46.9  FVC  % Predicted 97  FEV % Predicted 65  FeF 25-75 .37  FeF 25-75 % Predicted 2.11    ROV 01/07/14 -- COPD, also w hx of IBS/colitis, allergic rhinitis w cough. Unfortunately since last time she fractured her L hip and it was repaired, did rehab and completed. Now back home. She was just started on pred for lower back pain by Dr Alusio's office.  She is doing nasal steroid and atrovent nasal spray.  She is on atrovent qid, xopenex prn.   ROV 02/26/14 -- COPD, also w hx of IBS/colitis, allergic rhinitis w cough. Last time we stopped atrovent nebs and started Anoro. Her breathing has benefited significantly. No coughing or wheezing. Uses xopenex about once every several weeks.   ROV 08/26/14 -- follow-up visit for COPD, rhinitis with cough. She describes some  increased SOB beginning about 2 weeks ago, some mid chest tightness. Only happens with activity. She was started on doxy recently for an eye infection - hasn't improved / changed her breathing. No wheeze today, no cough. Rarely uses xopenex.   ROV 02/17/15 -- follow-up visit for COPD, chronic rhinitis associated with cough. She has had trouble with the pollen season - head congestion, throat irritation. She has done fairly well w regard to cough and breathing. She has had some sores in her nose and dry lips. She is on astelin and fluticasone, having some ulcerations in the nose. She wonders if anoro is drying her mouth / lips.   ~  April 22, 2015:  Add-on appt w/ SN>  Presents w/ 1 wk hx sore throat, cough, congestion, wheezing, and increased SOB; assoc w/ low grade temp & went to see her PCP, DrFagin w/ Rx for ZPak and cough syrup; improved but not resolved and SOB/ wheezing persist... Known COPD, ex-smoker, GOLD Stage 2 disease w/ exac once or twice a yr;  On Spiriva Respimat 2sp/d, Xopenex-HFA prn (ave 1-2sp per month;  Intol Anoro w/ sores in her nose that resolved off this inhaler;  Last CXR in [facility] was 12/2013 showingnorm heart size, atherosclerotic ao, clear lungs, osteopenia...  EXAM shows AFeb, VSS, O2sat=92% on RA;  HEENT- neg, mallampati1;  Chest- decr BS w/ few rhonchi at bases & mild end-exp wheezing;  Heart- RR w/o m/r/g;  Abd- soft, neg;  Ext-  w/o c/c/e...  IMP/PLAN>>  [name] has a COPD exac partially treated w/ ZPak, but persistent congestion, rhonchi, wheezing from airway inflammation; we discussed treatment w/ Dep80, Medrol Dosepak, continue inhalers, etc...   Past Medical History  Diagnosis Date  . Hypothyroidism   . Psoriatic arthritis   . IBS (irritable bowel syndrome)   . Hemorrhoids   . Diverticulosis   . Rectal polyp   . GERD (gastroesophageal reflux disease)   . Family hx of colon cancer   . Uterine polyp   . COPD (chronic obstructive pulmonary disease)   . Lymphocytic  colitis   . A-fib   . Arthritis     psoriatic  . History of shingles   . Osteoporosis     hx -osteopenia  . Psoriasis   . Hiatal hernia     Past Surgical History  Procedure Laterality Date  . Knee surgery      lt.  Marland Kitchen Uterine polypectomy    . Cholecystectomy  Dec 04, 2010  . Total knee revision  08/06/2012    Procedure: TOTAL KNEE REVISION;  Surgeon: [name], MD;  Location: [facility];  Service: Orthopedics;  Laterality: Left;  Left Total Knee Arthroplasty Revision  . Femur im nail Left 12/11/2013    Procedure: INTRAMEDULLARY (IM) NAIL FEMORAL;  Surgeon: [name], MD;  Location: [facility];  Service: Orthopedics;  Laterality: Left;  . Fracture surgery Left 12/11/2013    hip    Outpatient Encounter Prescriptions as of 04/22/2015  Medication Sig  . acetaminophen (TYLENOL) 325 MG tablet Take 2 tablets (650 mg total) by mouth every 6 (six) hours as needed (or Fever >/= 101).  Marland Kitchen ALPRAZolam (XANAX) 0.5 MG tablet Take 0.5-1 tablets (0.25-0.5 mg total) by mouth at bedtime as needed. Sleep  . clobetasol cream (TEMOVATE) 0.05 %   . diltiazem (CARTIA XT) 120 MG 24 hr capsule Take 1 capsule (120 mg total) by mouth daily.  . diphenoxylate-atropine (LOMOTIL) 2.5-0.025 MG per tablet Take one tablet by mouth three times daily as needed for diarrhea  . fluticasone (FLONASE) 50 MCG/ACT nasal spray Place 2 sprays into both nostrils 2 (two) times daily. For nasal congestion  . gabapentin (NEURONTIN) 100 MG capsule 4 (four) times daily.   Marland Kitchen levalbuterol (XOPENEX HFA) 45 MCG/ACT inhaler Inhale 1-2 puffs into the lungs as needed for wheezing or shortness of breath. Wheezing  . levothyroxine (SYNTHROID, LEVOTHROID) 125 MCG tablet Take 125 mcg by mouth every morning. For thyroid therapy  . methylcellulose (ARTIFICIAL TEARS) 1 % ophthalmic solution Place 1 drop into both eyes 2 (two) times daily as needed. Dry eyes  . rivaroxaban (XARELTO) 20 MG TABS tablet Take 1 tablet (20 mg total) by mouth daily  with supper.  . Tiotropium Bromide Monohydrate (SPIRIVA RESPIMAT) 2.5 MCG/ACT AERS Inhale 2 puffs into the lungs daily.  Marland Kitchen  levalbuterol (XOPENEX HFA) 45 MCG/ACT inhaler Inhale 1-2 puffs into the lungs as needed for wheezing or shortness of breath. Wheezing    Allergies  Allergen Reactions  . Codeine Itching    Current Medications, Allergies, Past Medical History, Past Surgical History, Family History, and Social History were reviewed in [employer] record.   Review of Systems            All symptoms NEG except where BOLDED >>  Constitutional:  F/C/S, fatigue, anorexia, unexpected weight change. HEENT:  HA, visual changes, hearing loss, earache, nasal symptoms, sore throat, mouth sores, hoarseness. Resp:  cough, sputum, hemoptysis; SOB,  tightness, wheezing. Cardio:  CP, palpit, DOE, orthopnea, edema. GI:  N/V/D/C, blood in stool; reflux, abd pain, distention, gas. GU:  dysuria, freq, urgency, hematuria, flank pain, voiding difficulty. MS:  joint pain, swelling, tenderness, decr ROM; neck pain, back pain, etc. Neuro:  HA, tremors, seizures, dizziness, syncope, weakness, numbness, gait abn. Skin:  suspicious lesions or skin rash. Heme:  adenopathy, bruising, bleeding. Psyche:  confusion, agitation, sleep disturbance, hallucinations, anxiety, depression suicidal.   Objective:   Physical Exam    Filed Vitals:   04/22/15 1354  BP: 140/80  Pulse: 96  Temp: 97.8 F (36.6 C)  TempSrc: Oral  Weight: 174 lb 9.6 oz (79.198 kg)  SpO2: 92%   Gen: Pleasant, overwt, in no distress,  normal affect  ENT: No lesions,  mouth clear,  oropharynx clear, no postnasal drip  Neck: No JVD, no TMG, no carotid bruits  Lungs: No use of accessory muscles, +exp wheezing/ bibasilar rhonchi, no rales or consolidation.  Cardiovascular: RRR, heart sounds normal, no murmur or gallops, no peripheral edema  Musculoskeletal: No deformities, no cyanosis or clubbing  Neuro: alert,  non focal  Skin: Warm, no lesions or rashes   Assessment:      IMP >>  GOLD Stage 2 COPD w/ acute exacerbation  PLAN >>  [name] has a COPD exac partially treated w/ ZPak, but persistent congestion, rhonchi, wheezing from airway inflammation; we discussed treatment w/ Dep80, Medrol Dosepak, continue inhalers, etc...     Plan:     Patient's Medications  New Prescriptions   METHYLPREDNISOLONE (MEDROL DOSEPAK) 4 MG TBPK TABLET    Use as directed  Previous Medications   ACETAMINOPHEN (TYLENOL) 325 MG TABLET    Take 2 tablets (650 mg total) by mouth every 6 (six) hours as needed (or Fever >/= 101).   ALPRAZOLAM (XANAX) 0.5 MG TABLET    Take 0.5-1 tablets (0.25-0.5 mg total) by mouth at bedtime as needed. Sleep   CLOBETASOL CREAM (TEMOVATE) 0.05 %       DILTIAZEM (CARTIA XT) 120 MG 24 HR CAPSULE    Take 1 capsule (120 mg total) by mouth daily.   DIPHENOXYLATE-ATROPINE (LOMOTIL) 2.5-0.025 MG PER TABLET    Take one tablet by mouth three times daily as needed for diarrhea   FLUTICASONE (FLONASE) 50 MCG/ACT NASAL SPRAY    Place 2 sprays into both nostrils 2 (two) times daily. For nasal congestion   GABAPENTIN (NEURONTIN) 100 MG CAPSULE    4 (four) times daily.    LEVOTHYROXINE (SYNTHROID, LEVOTHROID) 125 MCG TABLET    Take 125 mcg by mouth every morning. For thyroid therapy   METHYLCELLULOSE (ARTIFICIAL TEARS) 1 % OPHTHALMIC SOLUTION    Place 1 drop into both eyes 2 (two) times daily as needed. Dry eyes   RIVAROXABAN (XARELTO) 20 MG TABS TABLET    Take 1 tablet (20 mg total) by mouth daily with supper.   TIOTROPIUM BROMIDE MONOHYDRATE (SPIRIVA RESPIMAT) 2.5 MCG/ACT AERS    Inhale 2 puffs into the lungs daily.  Modified Medications   Modified Medication Previous Medication   LEVALBUTEROL (XOPENEX HFA) 45 MCG/ACT INHALER levalbuterol (XOPENEX HFA) 45 MCG/ACT inhaler      Inhale 1-2 puffs into the lungs as needed for wheezing or shortness of breath. Wheezing    Inhale 1-2 puffs into the lungs as  needed for wheezing or shortness of breath. Wheezing  Discontinued Medications   No medications on file

## 2015-04-26 ENCOUNTER — Telehealth: Payer: Self-pay | Admitting: *Deleted

## 2015-04-26 NOTE — Telephone Encounter (Signed)
Received PA request from covermymeds for Xopenex. Pt does not have a Part D on file. Called patient to get information on ins coverage. Patient has [employer] Id # [id number]. I was not able to complete PA with this information. Pt has hx of A-fib and is not able to take Albuterol=Palpitations. I will have to get more information.

## 2015-04-27 ENCOUNTER — Telehealth: Payer: Self-pay | Admitting: *Deleted

## 2015-04-27 ENCOUNTER — Ambulatory Visit (HOSPITAL_COMMUNITY)
Admission: RE | Admit: 2015-04-27 | Discharge: 2015-04-27 | Disposition: A | Discharge status: Home / Self Care | Payer: Medicare Other | Source: Ambulatory Visit | Attending: Internal Medicine | Admitting: Internal Medicine

## 2015-04-27 DIAGNOSIS — Z1231 Encounter for screening mammogram for malignant neoplasm of breast: Secondary | ICD-10-CM | POA: Diagnosis not present

## 2015-04-27 NOTE — Telephone Encounter (Addendum)
Approved 10/06/2014-10-08-2015 Ref# [id number] Member ID [id number]  Spent 27 mins working on this referral  We don't have patient Id Info on file Continential Ins is life ins.

## 2015-04-27 NOTE — Telephone Encounter (Signed)
Called [employer] to finish PA for Xopenex HFA Xopenex has been approved thru the end of year. A letter will be mailed out to patient and Pharmacy will be contacted.

## 2015-04-29 ENCOUNTER — Other Ambulatory Visit: Payer: Self-pay | Admitting: Internal Medicine

## 2015-04-29 ENCOUNTER — Encounter: Payer: Self-pay | Admitting: Cardiology

## 2015-04-29 DIAGNOSIS — R928 Other abnormal and inconclusive findings on diagnostic imaging of breast: Secondary | ICD-10-CM

## 2015-05-17 ENCOUNTER — Other Ambulatory Visit: Payer: Self-pay | Admitting: Internal Medicine

## 2015-05-17 ENCOUNTER — Ambulatory Visit (HOSPITAL_COMMUNITY)
Admission: RE | Admit: 2015-05-17 | Discharge: 2015-05-17 | Disposition: A | Discharge status: Home / Self Care | Payer: Medicare Other | Source: Ambulatory Visit | Attending: Internal Medicine | Admitting: Internal Medicine

## 2015-05-17 ENCOUNTER — Encounter (HOSPITAL_COMMUNITY): Payer: Medicare Other

## 2015-05-17 ENCOUNTER — Ambulatory Visit: Payer: Self-pay | Admitting: *Deleted

## 2015-05-17 DIAGNOSIS — Z5181 Encounter for therapeutic drug level monitoring: Secondary | ICD-10-CM

## 2015-05-17 DIAGNOSIS — I4891 Unspecified atrial fibrillation: Secondary | ICD-10-CM

## 2015-05-17 DIAGNOSIS — R921 Mammographic calcification found on diagnostic imaging of breast: Secondary | ICD-10-CM | POA: Insufficient documentation

## 2015-05-17 DIAGNOSIS — R928 Other abnormal and inconclusive findings on diagnostic imaging of breast: Secondary | ICD-10-CM

## 2015-05-18 ENCOUNTER — Encounter (HOSPITAL_COMMUNITY)
Admission: RE | Admit: 2015-05-18 | Discharge: 2015-05-18 | Disposition: A | Discharge status: Home / Self Care | Payer: Medicare Other | Source: Ambulatory Visit | Attending: Internal Medicine | Admitting: Internal Medicine

## 2015-05-18 DIAGNOSIS — M810 Age-related osteoporosis without current pathological fracture: Secondary | ICD-10-CM | POA: Diagnosis not present

## 2015-05-18 MED ORDER — DENOSUMAB 60 MG/ML ~~LOC~~ SOLN
60.0000 mg | Freq: Once | SUBCUTANEOUS | Status: AC
  Administered 2015-05-18: 60 mg via SUBCUTANEOUS
  Filled 2015-05-18: qty 1

## 2015-05-24 ENCOUNTER — Other Ambulatory Visit: Payer: Self-pay | Admitting: Cardiology

## 2015-05-27 ENCOUNTER — Ambulatory Visit
Admission: RE | Admit: 2015-05-27 | Discharge: 2015-05-27 | Disposition: A | Discharge status: Home / Self Care | Payer: Medicare Other | Source: Ambulatory Visit | Attending: Internal Medicine | Admitting: Internal Medicine

## 2015-05-27 ENCOUNTER — Other Ambulatory Visit: Payer: Self-pay | Admitting: Internal Medicine

## 2015-05-27 DIAGNOSIS — R921 Mammographic calcification found on diagnostic imaging of breast: Secondary | ICD-10-CM

## 2015-05-30 ENCOUNTER — Ambulatory Visit (INDEPENDENT_AMBULATORY_CARE_PROVIDER_SITE_OTHER): Payer: Medicare Other | Admitting: Emergency Medicine

## 2015-05-30 ENCOUNTER — Encounter: Payer: Self-pay | Admitting: Emergency Medicine

## 2015-05-30 VITALS — BP 126/70 | HR 105 | Ht 65.0 in | Wt 176.0 lb

## 2015-05-30 DIAGNOSIS — J441 Chronic obstructive pulmonary disease with (acute) exacerbation: Secondary | ICD-10-CM | POA: Diagnosis not present

## 2015-05-30 DIAGNOSIS — J302 Other seasonal allergic rhinitis: Secondary | ICD-10-CM

## 2015-05-30 NOTE — Assessment & Plan Note (Signed)
With recent exacerbation. Improved and now back to baseline. We will continue spiriva for now. In retrospect she believes that her nasal dryness was due to astelin instead of our trial of anoro. Given this info, I believe we could consider retrying anoro in the future if we believe she needs more than spiriva.

## 2015-05-30 NOTE — Patient Instructions (Signed)
Please continue your Spiriva Please continue fluticasone (Flonase) nasal spray as you have been taking it Try using Atrovent 0.03% nasal spray, 2 sprays each nostril 2 to 3 times a day if needed for nasal congestion and drainage.  Follow with Dr Lamonte Sakai in 6 months or sooner if you have any problems

## 2015-05-30 NOTE — Assessment & Plan Note (Signed)
Continue fluticasone. She didn't tolerate astelin dur to soreness and dryness. Will try atrovent NS to see if more successful

## 2015-05-30 NOTE — Progress Notes (Signed)
Subjective:    Patient ID: [name], female    DOB: 1932/12/14, 81 y.o.   MRN: [id number] HPI 81 yo former smoker (20 pk-yrs), hx of lymphocytic colitis/IBS on prednisone, hypothyroidism, psoriatic arthritis formerly on MTX, OA s/p knee replacement. Presents for evaluation of slowly progressive dyspnea. Bothers her with walking, climbing stairs. Also mentions frequent bouts of bronchitis with apparent associated bronchospasm. Had an acute bronchitis in 2/11, treated with pred + abx + SABA (xopenex due to tachycardia with albuterol). Then started on Spiriva. Now better but still SOB with exertion.   ROV 01/31/10 -- returns for f/u and after PFTs. Feels that she has benefitted from the Spiriva, her exertional symptoms and nighttime symptoms are much improved. Has had PFTs as below.   ROV 08/09/10 -- f/u for COPD (moderate AFL on spiro w no BD response). She was on Spiriva and felt that it helped her breathing, but it bothered her UA - made it dry, hoarse, throat burning. No allergy symptoms. She has been taking the xopenex. C/o some soreness on tongue, ? thrush.    PULMONARY FUNCTON TEST 01/18/2010  FVC 2.54  FEV1 1.19  FEV1/FVC 46.9  FVC  % Predicted 97  FEV % Predicted 65  FeF 25-75 .37  FeF 25-75 % Predicted 2.11    ROV 01/07/14 -- COPD, also w hx of IBS/colitis, allergic rhinitis w cough. Unfortunately since last time she fractured her L hip and it was repaired, did rehab and completed. Now back home. She was just started on pred for lower back pain by Dr Alusio's office.  She is doing nasal steroid and atrovent nasal spray.  She is on atrovent qid, xopenex prn.   ROV 02/26/14 -- COPD, also w hx of IBS/colitis, allergic rhinitis w cough. Last time we stopped atrovent nebs and started Anoro. Her breathing has benefited significantly. No coughing or wheezing. Uses xopenex about once every several weeks.   ROV 08/26/14 -- follow-up visit for COPD, rhinitis with cough. She describes some  increased SOB beginning about 2 weeks ago, some mid chest tightness. Only happens with activity. She was started on doxy recently for an eye infection - hasn't improved / changed her breathing. No wheeze today, no cough. Rarely uses xopenex.   ROV 02/17/15 -- follow-up visit for COPD, chronic rhinitis associated with cough. She has had trouble with the pollen season - head congestion, throat irritation. She has done fairly well w regard to cough and breathing. She has had some sores in her nose and dry lips. She is on astelin and fluticasone, having some ulcerations in the nose. She wonders if anoro is drying her mouth / lips.   ROV 05/30/15 -- hx of COPD, chronic rhinitis, cough, allergic rhinitis. She was seen by Dr Lenna Gilford 04/22/15 for an apparent acute exacerbation with wheeze and dyspnea. Returns today telling me that she improved quickly over about 2-3 days. She is on spiriva respimat, has xopenex available - hasn't needed it since her recent flare.  She is still having congestion, using flonase bid. We have tried azelastine in the past, ? Related to nasal sores and dryness.    Objective:   Physical Exam Filed Vitals:   05/30/15 1413  BP: 126/70  Pulse: 105  Height: 5' 5"  (1.651 m)  Weight: 176 lb (79.833 kg)  SpO2: 98%   Gen: Pleasant, overwt, in no distress,  normal affect  ENT: No lesions,  mouth clear,  oropharynx clear, no postnasal drip  Neck: No JVD,  no TMG, no carotid bruits  Lungs: No use of accessory muscles, no wheezes  Cardiovascular: RRR, heart sounds normal, no murmur or gallops, no peripheral edema  Musculoskeletal: No deformities, no cyanosis or clubbing  Neuro: alert, non focal  Skin: Warm, no lesions or rashes   Assessment & Plan:  COPD (chronic obstructive pulmonary disease) With recent exacerbation. Improved and now back to baseline. We will continue spiriva for now. In retrospect she believes that her nasal dryness was due to astelin instead of our trial of  anoro. Given this info, I believe we could consider retrying anoro in the future if we believe she needs more than spiriva.   Allergic rhinitis, seasonal Continue fluticasone. She didn't tolerate astelin dur to soreness and dryness. Will try atrovent NS to see if more successful

## 2015-05-31 ENCOUNTER — Telehealth: Payer: Self-pay | Admitting: Emergency Medicine

## 2015-05-31 MED ORDER — IPRATROPIUM BROMIDE 0.03 % NA SOLN: NASAL | Status: DC

## 2015-05-31 NOTE — Telephone Encounter (Signed)
Patient states that she saw Dr. Lamonte Sakai yesterday and her Atrovent nasal spray was not submitted to pharmacy. Rx sent. Patient notified. Nothing further needed.

## 2015-06-22 ENCOUNTER — Ambulatory Visit (INDEPENDENT_AMBULATORY_CARE_PROVIDER_SITE_OTHER): Payer: Medicare Other | Admitting: *Deleted

## 2015-06-22 DIAGNOSIS — I4891 Unspecified atrial fibrillation: Secondary | ICD-10-CM | POA: Diagnosis not present

## 2015-06-22 NOTE — Progress Notes (Signed)
Pt was restarted on Xarelto 20mg  daily for atrial fib on 04/21/14.   Pt denies any adverse effects taking Xarelto.  She has not had any bleeding, excessive bruising or GI Upset.  She is in the donut hole right now with a 3 month supply costing her $400.00+.  Reviewed patients medication list. Pt is not currently on any combined P-gp and strong CYP3A4 inhibitors/inducers (ketoconazole, traconazole, ritonavir, carbamazepine, phenytoin, rifampin, St. John's wort). Reviewed labs from  04/01/15 : SCr 0.88 Weight 176  CrCl 64.26. Dose is appropriate based on CrCl. Hgb and HCT: 15.0/43.4  A full discussion of the nature of anticoagulants has been carried out. A benefit/risk analysis has been presented to the patient, so that they understand the justification for choosing anticoagulation with Xarelto at this time. The need for compliance is stressed. Pt is aware to take the medication once daily with the largest meal of the day. Side effects of potential bleeding are discussed, including unusual colored urine or stools, coughing up blood or coffee ground emesis, nose bleeds or serious fall or head trauma. Discussed signs and symptoms of stroke. The patient should avoid any OTC items containing aspirin or ibuprofen. Avoid alcohol consumption. Call if any signs of abnormal bleeding. Discussed financial obligations and resolved any difficulty in obtaining medication. Next lab test in 6 months.   Put in recall.

## 2015-08-22 ENCOUNTER — Telehealth: Payer: Self-pay | Admitting: Cardiology

## 2015-08-22 NOTE — Telephone Encounter (Signed)
[place] notified, will fax to Dr Ramos/Deanna.

## 2015-08-22 NOTE — Telephone Encounter (Signed)
New message      Request for surgical clearance:  What type of surgery is being performed?  Back injection 1. When is this surgery scheduled? 09-13-15  Are there any medications that need to be held prior to surgery and how long? Hold xarelto 3 days prior 2. Name of physician performing surgery?  Dr Nelva Bush 3. What is your office phone and fax number? [phone] 4.

## 2015-08-22 NOTE — Telephone Encounter (Signed)
OK to hold Xarelto as described.

## 2015-08-23 ENCOUNTER — Encounter: Payer: Self-pay | Admitting: *Deleted

## 2015-08-23 ENCOUNTER — Encounter: Payer: Self-pay | Admitting: Cardiology

## 2015-08-23 ENCOUNTER — Ambulatory Visit (INDEPENDENT_AMBULATORY_CARE_PROVIDER_SITE_OTHER): Payer: Medicare Other | Admitting: Cardiology

## 2015-08-23 VITALS — BP 130/64 | HR 81 | Ht 65.0 in | Wt 183.1 lb

## 2015-08-23 DIAGNOSIS — R5382 Chronic fatigue, unspecified: Secondary | ICD-10-CM

## 2015-08-23 DIAGNOSIS — R000 Tachycardia, unspecified: Secondary | ICD-10-CM

## 2015-08-23 DIAGNOSIS — I480 Paroxysmal atrial fibrillation: Secondary | ICD-10-CM

## 2015-08-23 DIAGNOSIS — R609 Edema, unspecified: Secondary | ICD-10-CM

## 2015-08-23 DIAGNOSIS — I5022 Chronic systolic (congestive) heart failure: Secondary | ICD-10-CM

## 2015-08-23 DIAGNOSIS — R0602 Shortness of breath: Secondary | ICD-10-CM | POA: Diagnosis not present

## 2015-08-23 DIAGNOSIS — R600 Localized edema: Secondary | ICD-10-CM | POA: Insufficient documentation

## 2015-08-23 DIAGNOSIS — I739 Peripheral vascular disease, unspecified: Secondary | ICD-10-CM

## 2015-08-23 DIAGNOSIS — J438 Other emphysema: Secondary | ICD-10-CM

## 2015-08-23 MED ORDER — FUROSEMIDE 20 MG PO TABS: 20.0000 mg | ORAL_TABLET | Freq: Every day | ORAL | Status: DC

## 2015-08-23 MED ORDER — POTASSIUM CHLORIDE ER 10 MEQ PO TBCR: 10.0000 meq | EXTENDED_RELEASE_TABLET | Freq: Every day | ORAL | Status: DC

## 2015-08-23 NOTE — Patient Instructions (Addendum)
Medication Instructions:  Start lasix (furosemide) 38m daily.  Start KCL (potassium) 10 mEq daily  Labwork: BNP/CBCd today BMET/BNP in about 10 days.  Testing/Procedures: Your physician has requested that you have an echocardiogram. Echocardiography is a painless test that uses sound waves to create images of your heart. It provides your doctor with information about the size and shape of your heart and how well your heart's chambers and valves are working. This procedure takes approximately one hour. There are no restrictions for this procedure.    Follow-Up: Your physician recommends that you schedule a follow-up appointment in: 3 months with Dr MAundra Dubin    Any Other Special Instructions Will Be Listed Below (If Applicable). Use compression stockings to help the swelling in your feet and legs. Put them on in the morning and take them off at night. I have given you a prescription for these.      Low-Sodium Eating Plan Sodium raises blood pressure and causes water to be held in the body. Getting less sodium from food will help lower your blood pressure, reduce any swelling, and protect your heart, liver, and kidneys. We get sodium by adding salt (sodium chloride) to food. Most of our sodium comes from canned, boxed, and frozen foods. Restaurant foods, fast foods, and pizza are also very high in sodium. Even if you take medicine to lower your blood pressure or to reduce fluid in your body, getting less sodium from your food is important. WHAT IS MY PLAN? Most people should limit their sodium intake to 2,300 mg a day. Your health care provider recommends that you limit your sodium intake to __________ a day.  WHAT DO I NEED TO KNOW ABOUT THIS EATING PLAN? For the low-sodium eating plan, you will follow these general guidelines:  Choose foods with a % Daily Value for sodium of less than 5% (as listed on the food label).   Use salt-free seasonings or herbs instead of table salt or sea  salt.   Check with your health care provider or pharmacist before using salt substitutes.   Eat fresh foods.  Eat more vegetables and fruits.  Limit canned vegetables. If you do use them, rinse them well to decrease the sodium.   Limit cheese to 1 oz (28 g) per day.   Eat lower-sodium products, often labeled as "lower sodium" or "no salt added."  Avoid foods that contain monosodium glutamate (MSG). MSG is sometimes added to [place] and some canned foods.  Check food labels (Nutrition Facts labels) on foods to learn how much sodium is in one serving.  Eat more home-cooked food and less restaurant, buffet, and fast food.  When eating at a restaurant, ask that your food be prepared with less salt, or no salt if possible.  HOW DO I READ FOOD LABELS FOR SODIUM INFORMATION? The Nutrition Facts label lists the amount of sodium in one serving of the food. If you eat more than one serving, you must multiply the listed amount of sodium by the number of servings. Food labels may also identify foods as:  Sodium free--Less than 5 mg in a serving.  Very low sodium--35 mg or less in a serving.  Low sodium--140 mg or less in a serving.  Light in sodium--50% less sodium in a serving. For example, if a food that usually has 300 mg of sodium is changed to become light in sodium, it will have 150 mg of sodium.  Reduced sodium--25% less sodium in a serving. For example, if  a food that usually has 400 mg of sodium is changed to reduced sodium, it will have 300 mg of sodium. WHAT FOODS CAN I EAT? Grains Low-sodium cereals, including oats, puffed wheat and rice, and shredded wheat cereals. Low-sodium crackers. Unsalted rice and pasta. Lower-sodium bread.  Vegetables Frozen or fresh vegetables. Low-sodium or reduced-sodium canned vegetables. Low-sodium or reduced-sodium tomato sauce and paste. Low-sodium or reduced-sodium tomato and vegetable juices.  Fruits Fresh, frozen, and canned  fruit. Fruit juice.  Meat and Other Protein Products Low-sodium canned tuna and salmon. Fresh or frozen meat, poultry, seafood, and fish. Lamb. Unsalted nuts. Dried beans, peas, and lentils without added salt. Unsalted canned beans. Homemade soups without salt. Eggs.  Dairy Milk. Soy milk. Ricotta cheese. Low-sodium or reduced-sodium cheeses. Yogurt.  Condiments Fresh and dried herbs and spices. Salt-free seasonings. Onion and garlic powders. Low-sodium varieties of mustard and ketchup. Fresh or refrigerated horseradish. Lemon juice.  Fats and Oils Reduced-sodium salad dressings. Unsalted butter.  Other Unsalted popcorn and pretzels.  The items listed above may not be a complete list of recommended foods or beverages. Contact your dietitian for more options. WHAT FOODS ARE NOT RECOMMENDED? Grains Instant hot cereals. Bread stuffing, pancake, and biscuit mixes. Croutons. Seasoned rice or pasta mixes. Noodle soup cups. Boxed or frozen macaroni and cheese. Self-rising flour. Regular salted crackers. Vegetables Regular canned vegetables. Regular canned tomato sauce and paste. Regular tomato and vegetable juices. Frozen vegetables in sauces. Salted [place] fries. Olives. Angie Fava. Relishes. Sauerkraut. Salsa. Meat and Other Protein Products Salted, canned, smoked, spiced, or pickled meats, seafood, or fish. Bacon, ham, sausage, hot dogs, corned beef, chipped beef, and packaged luncheon meats. Salt pork. Jerky. Pickled herring. Anchovies, regular canned tuna, and sardines. Salted nuts. Dairy Processed cheese and cheese spreads. Cheese curds. Blue cheese and cottage cheese. Buttermilk.  Condiments Onion and garlic salt, seasoned salt, table salt, and sea salt. Canned and packaged gravies. Worcestershire sauce. Tartar sauce. Barbecue sauce. Teriyaki sauce. Soy sauce, including reduced sodium. Steak sauce. Fish sauce. Oyster sauce. Cocktail sauce. Horseradish that you find on the shelf.  Regular ketchup and mustard. Meat flavorings and tenderizers. Bouillon cubes. Hot sauce. Tabasco sauce. Marinades. Taco seasonings. Relishes. Fats and Oils Regular salad dressings. Salted butter. Margarine. Ghee. Bacon fat.  Other Potato and tortilla chips. Corn chips and puffs. Salted popcorn and pretzels. Canned or dried soups. Pizza. Frozen entrees and pot pies.  The items listed above may not be a complete list of foods and beverages to avoid. Contact your dietitian for more information.   This information is not intended to replace advice given to you by your health care provider. Make sure you discuss any questions you have with your health care provider.   Document Released: 03/16/2002 Document Revised: 10/15/2014 Document Reviewed: 07/29/2013 Elsevier Interactive Patient Education [employer].     If you need a refill on your cardiac medications before your next appointment, please call your pharmacy.

## 2015-08-23 NOTE — Progress Notes (Signed)
Patient ID: [name], female   DOB: [birth year], 81 y.o.   MRN: [id number] PCP: Dr. Willey Blade  81 yo with history of psoriatic arthritis, lymphocytic colitis, paroxysmal atrial fibrillation, and COPD returns for cardiology followup. She had a normal Lexiscan Myoview in 7/13 for atypical chest pain.   Since her last visit, she has not had tachypalpitations concerning for recurrent atrial fibrillation.  She is in NSR today.  No chest pain.  She is chronically short of breath.  This has been gradually worsening.  She can walk about 1/2 block before stopping and gets short of breath with steps.  She follows with pulmonary for COPD.  She is additionally reporting significant lower extremity edema that is new.  She is taking Xarelto with no BRBPR or melena. Low back pain leads to some limitation.   Labs (12/13): TnI negative x 2, TSH normal, LDL 59 Labs (7/14): K 3.5, creatinine 0.79 Labs (9/14): HCT 41.7 Labs (7/15): K 4.6, creatinine 0.86, hgb 11.3 Labs (2/16): K 4.7, creatinine 0.77, HCT 45 Labs (6/16): K 4.3, creatinine 0.88, LDL 114, hgb 15  ECG: NSR, normal  PMH: 1. Lymphocytic colitis 2. Hypothyroidism 3. Psoriatic arthritis with h/o MTX use.  4. OA s/p TKR 5. COPD with h/o exacerbations.  Prior smoker.  6. GERD with hiatal hernia 7. CCY 8. Depression 9. Chest pain: Lexiscan myoview (12/13) with EF 75%, apical thinning but no ischemia or infarction.  10. Paroxysmal atrial fibrillation 11. Hip fracture 3/15 s/p repair 12. Peripheral arterial dopplers 3/15: Normal ABIs.  13. Low back pain  SH: Former smoker, quit in 1982, lives alone.   FH: Father with PCM, brother with MI.   ROS: All systems reviewed and negative except as per HPI.    Current Outpatient Prescriptions  Medication Sig Dispense Refill  . acetaminophen (TYLENOL) 325 MG tablet Take 2 tablets (650 mg total) by mouth every 6 (six) hours as needed (or Fever >/= 101). 60 tablet 0  . ALPRAZolam (XANAX) 0.5 MG tablet  Take 0.5-1 tablets (0.25-0.5 mg total) by mouth at bedtime as needed. Sleep 30 tablet 0  . clobetasol cream (TEMOVATE) 0.05 %     . diltiazem (CARTIA XT) 120 MG 24 hr capsule Take 1 capsule (120 mg total) by mouth daily. 90 capsule 3  . diphenoxylate-atropine (LOMOTIL) 2.5-0.025 MG per tablet Take one tablet by mouth three times daily as needed for diarrhea 90 tablet 5  . fluticasone (FLONASE) 50 MCG/ACT nasal spray Place 2 sprays into both nostrils 2 (two) times daily. For nasal congestion    . gabapentin (NEURONTIN) 100 MG capsule Take 100 mg by mouth 3 (three) times daily.     Marland Kitchen ipratropium (ATROVENT) 0.03 % nasal spray 2 sprays each nostril, 2-3 times daily for congestion 30 mL 12  . levalbuterol (XOPENEX HFA) 45 MCG/ACT inhaler Inhale 1-2 puffs into the lungs as needed for wheezing or shortness of breath. Wheezing 1 Inhaler 2  . levothyroxine (SYNTHROID, LEVOTHROID) 125 MCG tablet Take 125 mcg by mouth every morning. For thyroid therapy    . methylcellulose (ARTIFICIAL TEARS) 1 % ophthalmic solution Place 1 drop into both eyes 2 (two) times daily as needed. Dry eyes    . methylPREDNISolone (MEDROL DOSEPAK) 4 MG TBPK tablet Take by mouth once.  0  . Tiotropium Bromide Monohydrate (SPIRIVA RESPIMAT) 2.5 MCG/ACT AERS Inhale 2 puffs into the lungs daily. 1 Inhaler 5  . XARELTO 20 MG TABS tablet TAKE 1 TABLET BY MOUTH EVERY DAY WITH  SUPPER 90 tablet 2  . furosemide (LASIX) 20 MG tablet Take 1 tablet (20 mg total) by mouth daily. 30 tablet 6  . potassium chloride (K-DUR) 10 MEQ tablet Take 1 tablet (10 mEq total) by mouth daily. 30 tablet 6  . [DISCONTINUED] Calcium Carbonate (CALCIUM 600) 1500 MG TABS Take 2 tablets by mouth daily.       No current facility-administered medications for this visit.    BP 130/64 mmHg  Pulse 81  Ht 5' 5"  (1.651 m)  Wt 183 lb 1.9 oz (83.063 kg)  BMI 30.47 kg/m2 General: NAD Neck: JVP 8 cm, no thyromegaly or thyroid nodule.  Lungs: Prolonged respiratory  phase CV: Nondisplaced PMI.  Heart regular S1/S2, no S3/S4, no murmur.  1+ edema 1/2 up lower legs bilaterally.  No carotid bruit.  Unable to palpate pedal pulses.  Abdomen: Soft, nontender, no hepatosplenomegaly, no distention.   Neurologic: Alert and oriented x 3.  Psych: Normal affect. Extremities: No clubbing or cyanosis.   Assessment/Plan: 1. Atrial fibrillation: Paroxysmal atrial fibrillation, quite symptomatic when it occurs (ER visits).  She is in NSR today on diltiazem CD and Xarelto.  She is now off dronedarone due to expense but has had no tachypalpitations.  - If she has symptomatic recurrence of atrial fibrillation, flecainide would be an option for rhythm control but would need ischemic workup.  - CBC today 2. COPD: Patient has chronic dyspnea that has been worsening.  I suspect that COPD plays a role.  3. Chronic diastolic CHF: Patient has signiificant lower extremity edema but not marked JVD.  It is possible that this is venous insufficiency.  Diltiazem may play a role in the swelling but she has been on it for a long time prior to this without problem.  Given her COPD, I am concerned for pulmonary hypertension and RV failure.  - Cut back on sodium intake.  - Lasix 20 mg daily with KCl 10 daily.  BMET/BNP in 10 days.  - She will wear graded compression stockings during the day. - Echo to assess RV and PA pressure.   Followup in 3 months.   [name] 08/23/2015

## 2015-08-24 LAB — CBC WITH DIFFERENTIAL/PLATELET
Basophils Absolute: 0 10*3/uL (ref 0.0–0.1)
Basophils Relative: 0 % (ref 0–1)
EOS ABS: 0 10*3/uL (ref 0.0–0.7)
Eosinophils Relative: 0 % (ref 0–5)
HCT: 46.2 % — ABNORMAL HIGH (ref 36.0–46.0)
Hemoglobin: 15.8 g/dL — ABNORMAL HIGH (ref 12.0–15.0)
LYMPHS ABS: 1.6 10*3/uL (ref 0.7–4.0)
Lymphocytes Relative: 11 % — ABNORMAL LOW (ref 12–46)
MCH: 31.2 pg (ref 26.0–34.0)
MCHC: 34.2 g/dL (ref 30.0–36.0)
MCV: 91.3 fL (ref 78.0–100.0)
MONO ABS: 1.7 10*3/uL — AB (ref 0.1–1.0)
MONOS PCT: 12 % (ref 3–12)
MPV: 10.8 fL (ref 8.6–12.4)
NEUTROS PCT: 77 % (ref 43–77)
Neutro Abs: 11 10*3/uL — ABNORMAL HIGH (ref 1.7–7.7)
PLATELETS: 340 10*3/uL (ref 150–400)
RBC: 5.06 MIL/uL (ref 3.87–5.11)
RDW: 13.3 % (ref 11.5–15.5)
WBC: 14.3 10*3/uL — ABNORMAL HIGH (ref 4.0–10.5)

## 2015-08-24 LAB — BRAIN NATRIURETIC PEPTIDE: BRAIN NATRIURETIC PEPTIDE: 92.1 pg/mL (ref 0.0–100.0)

## 2015-09-05 ENCOUNTER — Other Ambulatory Visit: Payer: Self-pay

## 2015-09-05 ENCOUNTER — Other Ambulatory Visit (INDEPENDENT_AMBULATORY_CARE_PROVIDER_SITE_OTHER): Payer: Medicare Other | Admitting: *Deleted

## 2015-09-05 ENCOUNTER — Ambulatory Visit (HOSPITAL_COMMUNITY): Payer: Medicare Other | Attending: Internal Medicine

## 2015-09-05 ENCOUNTER — Telehealth: Payer: Self-pay | Admitting: Cardiology

## 2015-09-05 DIAGNOSIS — Z87891 Personal history of nicotine dependence: Secondary | ICD-10-CM | POA: Insufficient documentation

## 2015-09-05 DIAGNOSIS — I5189 Other ill-defined heart diseases: Secondary | ICD-10-CM | POA: Diagnosis not present

## 2015-09-05 DIAGNOSIS — J449 Chronic obstructive pulmonary disease, unspecified: Secondary | ICD-10-CM | POA: Diagnosis not present

## 2015-09-05 DIAGNOSIS — I480 Paroxysmal atrial fibrillation: Secondary | ICD-10-CM

## 2015-09-05 DIAGNOSIS — R000 Tachycardia, unspecified: Secondary | ICD-10-CM

## 2015-09-05 DIAGNOSIS — I509 Heart failure, unspecified: Secondary | ICD-10-CM | POA: Diagnosis not present

## 2015-09-05 DIAGNOSIS — I071 Rheumatic tricuspid insufficiency: Secondary | ICD-10-CM | POA: Diagnosis not present

## 2015-09-05 DIAGNOSIS — I517 Cardiomegaly: Secondary | ICD-10-CM | POA: Diagnosis not present

## 2015-09-05 DIAGNOSIS — I5022 Chronic systolic (congestive) heart failure: Secondary | ICD-10-CM

## 2015-09-05 DIAGNOSIS — R0602 Shortness of breath: Secondary | ICD-10-CM | POA: Diagnosis not present

## 2015-09-05 DIAGNOSIS — R5382 Chronic fatigue, unspecified: Secondary | ICD-10-CM

## 2015-09-05 DIAGNOSIS — I4891 Unspecified atrial fibrillation: Secondary | ICD-10-CM | POA: Insufficient documentation

## 2015-09-05 LAB — BASIC METABOLIC PANEL
BUN: 17 mg/dL (ref 7–25)
CO2: 24 mmol/L (ref 20–31)
CREATININE: 0.89 mg/dL — AB (ref 0.60–0.88)
Calcium: 9 mg/dL (ref 8.6–10.4)
Chloride: 100 mmol/L (ref 98–110)
Glucose, Bld: 91 mg/dL (ref 65–99)
Potassium: 4.1 mmol/L (ref 3.5–5.3)
Sodium: 138 mmol/L (ref 135–146)

## 2015-09-05 NOTE — Telephone Encounter (Signed)
Walk in pt form-pt needs rx for knee high compression-gave to [employer] L

## 2015-09-06 LAB — BRAIN NATRIURETIC PEPTIDE: Brain Natriuretic Peptide: 51.2 pg/mL (ref 0.0–100.0)

## 2015-09-07 NOTE — Telephone Encounter (Signed)
Prescription mailed to pt.

## 2015-09-09 ENCOUNTER — Telehealth: Payer: Self-pay | Admitting: Cardiology

## 2015-09-09 NOTE — Telephone Encounter (Signed)
New message  Request for surgical clearance:  1. What type of surgery is being performed? Back injection   2. When is this surgery scheduled? 09/13/2015  3. Are there any medications that need to be held prior to surgery and how long? Xarelto 5 days   4. Name of physician performing surgery? Dr. Nelva Bush with [facility]  5. Comments: Pt called states that [facility] says that they have faxed over a clearance. She wanted to be sure the office has received it. Please call back to discuss.

## 2015-09-09 NOTE — Telephone Encounter (Signed)
Called patient back.  Informed that this was handled on 08-22-15.

## 2015-09-23 ENCOUNTER — Other Ambulatory Visit: Payer: Self-pay | Admitting: Emergency Medicine

## 2015-09-23 ENCOUNTER — Telehealth: Payer: Self-pay | Admitting: Emergency Medicine

## 2015-09-23 NOTE — Telephone Encounter (Signed)
Called spoke with pt. Made aware refill was sent in Earlier. Nothing further needed

## 2015-10-05 ENCOUNTER — Encounter: Payer: Self-pay | Admitting: *Deleted

## 2015-10-21 ENCOUNTER — Encounter: Payer: Self-pay | Admitting: Internal Medicine

## 2015-11-07 ENCOUNTER — Other Ambulatory Visit: Payer: Self-pay | Admitting: Cardiology

## 2015-11-18 ENCOUNTER — Encounter (HOSPITAL_COMMUNITY): Admission: RE | Admit: 2015-11-18 | Payer: Medicare Other | Source: Ambulatory Visit

## 2015-11-18 ENCOUNTER — Encounter (HOSPITAL_COMMUNITY)
Admission: RE | Admit: 2015-11-18 | Discharge: 2015-11-18 | Disposition: A | Discharge status: Home / Self Care | Payer: Medicare Other | Source: Ambulatory Visit | Attending: Internal Medicine | Admitting: Internal Medicine

## 2015-11-18 DIAGNOSIS — M810 Age-related osteoporosis without current pathological fracture: Secondary | ICD-10-CM | POA: Diagnosis not present

## 2015-11-18 MED ORDER — DENOSUMAB 60 MG/ML ~~LOC~~ SOLN
60.0000 mg | Freq: Once | SUBCUTANEOUS | Status: AC
  Administered 2015-11-18: 60 mg via SUBCUTANEOUS
  Filled 2015-11-18: qty 1

## 2015-11-28 ENCOUNTER — Encounter: Payer: Self-pay | Admitting: Emergency Medicine

## 2015-11-28 ENCOUNTER — Ambulatory Visit (INDEPENDENT_AMBULATORY_CARE_PROVIDER_SITE_OTHER): Payer: Medicare Other | Admitting: Emergency Medicine

## 2015-11-28 VITALS — BP 132/68 | HR 99 | Ht 65.0 in | Wt 183.0 lb

## 2015-11-28 DIAGNOSIS — J438 Other emphysema: Secondary | ICD-10-CM

## 2015-11-28 DIAGNOSIS — J302 Other seasonal allergic rhinitis: Secondary | ICD-10-CM

## 2015-11-28 MED ORDER — IPRATROPIUM BROMIDE 0.03 % NA SOLN
NASAL | Status: DC
Start: 2015-11-28 — End: 2016-01-24

## 2015-11-28 NOTE — Progress Notes (Signed)
Subjective:    Patient ID: [name], female    DOB: 08-31-1933, 82 y.o.   MRN: [id number] HPI 82 yo former smoker (20 pk-yrs), hx of lymphocytic colitis/IBS on prednisone, hypothyroidism, psoriatic arthritis formerly on MTX, OA s/p knee replacement. Presents for evaluation of slowly progressive dyspnea. Bothers her with walking, climbing stairs. Also mentions frequent bouts of bronchitis with apparent associated bronchospasm. Had an acute bronchitis in 2/11, treated with pred + abx + SABA (xopenex due to tachycardia with albuterol). Then started on Spiriva. Now better but still SOB with exertion.   ROV 01/31/10 -- returns for f/u and after PFTs. Feels that she has benefitted from the Spiriva, her exertional symptoms and nighttime symptoms are much improved. Has had PFTs as below.   ROV 08/09/10 -- f/u for COPD (moderate AFL on spiro w no BD response). She was on Spiriva and felt that it helped her breathing, but it bothered her UA - made it dry, hoarse, throat burning. No allergy symptoms. She has been taking the xopenex. C/o some soreness on tongue, ? thrush.    PULMONARY FUNCTON TEST 01/18/2010  FVC 2.54  FEV1 1.19  FEV1/FVC 46.9  FVC  % Predicted 97  FEV % Predicted 65  FeF 25-75 .37  FeF 25-75 % Predicted 2.11    ROV 01/07/14 -- COPD, also w hx of IBS/colitis, allergic rhinitis w cough. Unfortunately since last time she fractured her L hip and it was repaired, did rehab and completed. Now back home. She was just started on pred for lower back pain by [name]'s office.  She is doing nasal steroid and atrovent nasal spray.  She is on atrovent qid, xopenex prn.   ROV 02/26/14 -- COPD, also w hx of IBS/colitis, allergic rhinitis w cough. Last time we stopped atrovent nebs and started Anoro. Her breathing has benefited significantly. No coughing or wheezing. Uses xopenex about once every several weeks.   ROV 08/26/14 -- follow-up visit for COPD, rhinitis with cough. She describes some  increased SOB beginning about 2 weeks ago, some mid chest tightness. Only happens with activity. She was started on doxy recently for an eye infection - hasn't improved / changed her breathing. No wheeze today, no cough. Rarely uses xopenex.   ROV 02/17/15 -- follow-up visit for COPD, chronic rhinitis associated with cough. She has had trouble with the pollen season - head congestion, throat irritation. She has done fairly well w regard to cough and breathing. She has had some sores in her nose and dry lips. She is on astelin and fluticasone, having some ulcerations in the nose. She wonders if anoro is drying her mouth / lips.   ROV 05/30/15 -- hx of COPD, chronic rhinitis, cough, allergic rhinitis. She was seen by [name] 04/22/15 for an apparent acute exacerbation with wheeze and dyspnea. Returns today telling me that she improved quickly over about 2-3 days. She is on spiriva respimat, has xopenex available - hasn't needed it since her recent flare.  She is still having congestion, using flonase bid. We have tried azelastine in the past, ? Related to nasal sores and dryness.   ROV 11/28/15 -- patient has a history of COPD, chronic rhinitis, year-round allergies and chronic cough. She has a history of psoriatic arthritis and has used methotrexate in the past. Last seen 6 months ago. Since that time she had more difficulty during the Fall. She had to use her xopenex more frequently at the time. At our last visit we  tried starting Atrovent nasal spray for her refractory congestion and nasal drip. She tells me that she is now doing well. She is on spiriva respimat, having dry mouth and sore tongue on this medications.  In retrospect she would like to retry Anoro, doesn't believe it was responsible for her prior side effects as we had suspected.    Objective:   Physical Exam Filed Vitals:   11/28/15 1355  BP: 132/68  Pulse: 99  Height: 5' 5"  (1.651 m)  Weight: 183 lb (83.008 kg)  SpO2: 92%   Gen:  Pleasant, overwt, in no distress,  normal affect  ENT: No lesions,  mouth clear,  oropharynx clear, no postnasal drip  Neck: No JVD, no TMG, no carotid bruits  Lungs: No use of accessory muscles, no wheezes  Cardiovascular: RRR, heart sounds normal, no murmur or gallops, no peripheral edema  Musculoskeletal: No deformities, no cyanosis or clubbing  Neuro: alert, non focal  Skin: Warm, no lesions or rashes   Assessment & Plan:  COPD (chronic obstructive pulmonary disease) We will try changing your Spiriva to Anoro again - take one inhalation daily Call our office to let us know if you want to continue Anoro (versus going back to Spiriva) Keep xopenex available to use as needed. We will perform a prior a prior authorization when it is time to refill it.  Follow with [name] in 6 months or sooner if you have any problems  Allergic rhinitis, seasonal Continue fluticasone nasal spray daily We will refill ipratropium nasal spray - use 2 sprays each side up to 2-3x a day if needed for nasal drainage.

## 2015-11-28 NOTE — Patient Instructions (Addendum)
We will try changing your Spiriva to Anoro again - take one inhalation daily Call our office to let us know if you want to continue Anoro (versus going back to Spiriva) Keep xopenex available to use as needed. We will perform a prior a prior authorization when it is time to refill it.  Continue fluticasone nasal spray daily We will refill ipratropium nasal spray - use 2 sprays each side up to 2-3x a day if needed for nasal drainage.  Follow with [name] in 6 months or sooner if you have any problems

## 2015-11-28 NOTE — Assessment & Plan Note (Signed)
Continue fluticasone nasal spray daily We will refill ipratropium nasal spray - use 2 sprays each side up to 2-3x a day if needed for nasal drainage.

## 2015-11-28 NOTE — Addendum Note (Signed)
Addended by: Virl Cagey on: 11/28/2015 03:03 PM   Modules accepted: Orders

## 2015-11-28 NOTE — Assessment & Plan Note (Signed)
We will try changing your Spiriva to Anoro again - take one inhalation daily Call our office to let us know if you want to continue Anoro (versus going back to Spiriva) Keep xopenex available to use as needed. We will perform a prior a prior authorization when it is time to refill it.  Follow with Dr Lamonte Sakai in 6 months or sooner if you have any problems

## 2015-11-30 ENCOUNTER — Telehealth: Payer: Self-pay | Admitting: Emergency Medicine

## 2015-11-30 NOTE — Telephone Encounter (Signed)
Received fax for PA for ipratropium nasal spray from [facility] at in [place].Hulen Skains and spoke with Ty with medicare at [phone] in regards to PA for ipratropium nasal spray she states that no PA was needed. I then called [facility] to confirm spoke with josh he stated that it had already been taken care of and the cost of the medication was now $2. Nothing further needed.

## 2015-12-02 ENCOUNTER — Ambulatory Visit (INDEPENDENT_AMBULATORY_CARE_PROVIDER_SITE_OTHER): Payer: Medicare Other | Admitting: Cardiology

## 2015-12-02 ENCOUNTER — Encounter: Payer: Self-pay | Admitting: Cardiology

## 2015-12-02 VITALS — BP 124/70 | HR 105 | Ht 65.0 in | Wt 181.0 lb

## 2015-12-02 DIAGNOSIS — J438 Other emphysema: Secondary | ICD-10-CM

## 2015-12-02 DIAGNOSIS — I480 Paroxysmal atrial fibrillation: Secondary | ICD-10-CM

## 2015-12-02 LAB — CBC WITH DIFFERENTIAL/PLATELET
BASOS ABS: 0 10*3/uL (ref 0.0–0.1)
Basophils Relative: 0 % (ref 0–1)
Eosinophils Absolute: 0.2 10*3/uL (ref 0.0–0.7)
Eosinophils Relative: 2 % (ref 0–5)
HEMATOCRIT: 44.4 % (ref 36.0–46.0)
Hemoglobin: 14.9 g/dL (ref 12.0–15.0)
LYMPHS ABS: 1.5 10*3/uL (ref 0.7–4.0)
LYMPHS PCT: 18 % (ref 12–46)
MCH: 29.8 pg (ref 26.0–34.0)
MCHC: 33.6 g/dL (ref 30.0–36.0)
MCV: 88.8 fL (ref 78.0–100.0)
MPV: 10.8 fL (ref 8.6–12.4)
Monocytes Absolute: 0.7 10*3/uL (ref 0.1–1.0)
Monocytes Relative: 9 % (ref 3–12)
NEUTROS ABS: 5.9 10*3/uL (ref 1.7–7.7)
NEUTROS PCT: 71 % (ref 43–77)
Platelets: 249 10*3/uL (ref 150–400)
RBC: 5 MIL/uL (ref 3.87–5.11)
RDW: 14.1 % (ref 11.5–15.5)
WBC: 8.3 10*3/uL (ref 4.0–10.5)

## 2015-12-02 LAB — BASIC METABOLIC PANEL
BUN: 20 mg/dL (ref 7–25)
CHLORIDE: 99 mmol/L (ref 98–110)
CO2: 25 mmol/L (ref 20–31)
Calcium: 8.5 mg/dL — ABNORMAL LOW (ref 8.6–10.4)
Creat: 0.88 mg/dL (ref 0.60–0.88)
Glucose, Bld: 108 mg/dL — ABNORMAL HIGH (ref 65–99)
Potassium: 4.1 mmol/L (ref 3.5–5.3)
Sodium: 136 mmol/L (ref 135–146)

## 2015-12-02 NOTE — Patient Instructions (Signed)
Medication Instructions:  Your physician recommends that you continue on your current medications as directed. Please refer to the Current Medication list given to you today.   Labwork: TODAY - basic metabolic panel, CBC   Testing/Procedures: None Ordered  Follow-Up: Your physician wants you to follow-up in: 6 months with Dr. Aundra Dubin.  You will receive a reminder letter in the mail two months in advance. If you don't receive a letter, please call our office to schedule the follow-up appointment.   If you need a refill on your cardiac medications before your next appointment, please call your pharmacy.   Thank you for choosing [facility]! Christen Bame, RN [phone]

## 2015-12-04 NOTE — Progress Notes (Signed)
Patient ID: [name], female   DOB: [birth year], 82 y.o.   MRN: [id number] PCP: Dr. Willey Blade  82 yo with history of psoriatic arthritis, lymphocytic colitis, paroxysmal atrial fibrillation, and COPD returns for cardiology followup. She had a normal Lexiscan Myoview in 7/13 for atypical chest pain.   Since her last visit, she has not had tachypalpitations concerning for recurrent atrial fibrillation.  She is in NSR today.  No chest pain.  She is chronically short of breath.  She can walk about 1/2 block before stopping and gets short of breath with steps, this has not changed.  She follows with pulmonary for COPD.  She is taking Xarelto with no BRBPR or melena. Low back pain leads to some limitation.   Labs (12/13): TnI negative x 2, TSH normal, LDL 59 Labs (7/14): K 3.5, creatinine 0.79 Labs (9/14): HCT 41.7 Labs (7/15): K 4.6, creatinine 0.86, hgb 11.3 Labs (2/16): K 4.7, creatinine 0.77, HCT 45 Labs (6/16): K 4.3, creatinine 0.88, LDL 114, hgb 15 Labs (11/16): BNP 51, creatinine 0.89  ECG: NSR, rightward axis  PMH: 1. Lymphocytic colitis 2. Hypothyroidism 3. Psoriatic arthritis with h/o MTX use.  4. OA s/p TKR 5. COPD with h/o exacerbations.  Prior smoker.  6. GERD with hiatal hernia 7. CCY 8. Depression 9. Chest pain: Lexiscan myoview (12/13) with EF 75%, apical thinning but no ischemia or infarction.  10. Paroxysmal atrial fibrillation 11. Hip fracture 3/15 s/p repair 12. Peripheral arterial dopplers 3/15: Normal ABIs.  13. Low back pain 14. Echo (11/16) with EF 60-65%, mild LVH, normal RV size/systolic function.   SH: Former smoker, quit in 1982, lives alone.   FH: Father with PCM, brother with MI.   ROS: All systems reviewed and negative except as per HPI.    Current Outpatient Prescriptions  Medication Sig Dispense Refill  . acetaminophen (TYLENOL) 325 MG tablet Take 2 tablets (650 mg total) by mouth every 6 (six) hours as needed (or Fever >/= 101). 60 tablet 0  .  ALPRAZolam (XANAX) 0.5 MG tablet Take 0.5-1 tablets (0.25-0.5 mg total) by mouth at bedtime as needed. Sleep 30 tablet 0  . CARTIA XT 120 MG 24 hr capsule TAKE 1 CAPSULE BY MOUTH EVERY DAY 90 capsule 0  . clobetasol cream (TEMOVATE) 0.05 %     . diphenoxylate-atropine (LOMOTIL) 2.5-0.025 MG per tablet Take one tablet by mouth three times daily as needed for diarrhea 90 tablet 5  . fluticasone (FLONASE) 50 MCG/ACT nasal spray Place 2 sprays into both nostrils 2 (two) times daily. For nasal congestion    . furosemide (LASIX) 20 MG tablet Take 1 tablet (20 mg total) by mouth daily. 30 tablet 6  . gabapentin (NEURONTIN) 100 MG capsule Take 100 mg by mouth 3 (three) times daily.     Marland Kitchen ipratropium (ATROVENT) 0.03 % nasal spray 2 sprays each nostril, 2-3 times daily for congestion 30 mL 6  . levalbuterol (XOPENEX HFA) 45 MCG/ACT inhaler Inhale 1-2 puffs into the lungs as needed for wheezing or shortness of breath. Wheezing 1 Inhaler 2  . levothyroxine (SYNTHROID, LEVOTHROID) 125 MCG tablet Take 125 mcg by mouth every morning. For thyroid therapy    . methylcellulose (ARTIFICIAL TEARS) 1 % ophthalmic solution Place 1 drop into both eyes 2 (two) times daily as needed. Dry eyes    . potassium chloride (K-DUR) 10 MEQ tablet Take 1 tablet (10 mEq total) by mouth daily. 30 tablet 6  . SPIRIVA RESPIMAT 2.5 MCG/ACT AERS INHALE  2 PUFFS BY MOUTH DAILY 4 g 5  . XARELTO 20 MG TABS tablet TAKE 1 TABLET BY MOUTH EVERY DAY WITH SUPPER 90 tablet 2  . [DISCONTINUED] Calcium Carbonate (CALCIUM 600) 1500 MG TABS Take 2 tablets by mouth daily.       No current facility-administered medications for this visit.    BP 124/70 mmHg  Pulse 105  Ht 5' 5"  (1.651 m)  Wt 181 lb (82.101 kg)  BMI 30.12 kg/m2 General: NAD Neck: JVP 7 cm, no thyromegaly or thyroid nodule.  Lungs: Prolonged respiratory phase CV: Nondisplaced PMI.  Heart regular S1/S2, no S3/S4, no murmur.  No edema.  No carotid bruit.  Unable to palpate pedal  pulses.  Abdomen: Soft, nontender, no hepatosplenomegaly, no distention.   Neurologic: Alert and oriented x 3.  Psych: Normal affect. Extremities: No clubbing or cyanosis.   Assessment/Plan: 1. Atrial fibrillation: Paroxysmal atrial fibrillation, quite symptomatic when it occurs (ER visits).  She is in NSR today on diltiazem CD and Xarelto.  She is now off dronedarone due to expense but has had no recent tachypalpitations.  - If she has symptomatic recurrence of atrial fibrillation, flecainide would be an option for rhythm control but would need ischemic workup.  - CBC/BMET today 2. COPD: Chronic dyspnea is likely due primarily to COPD.  Echo in 11/16 was unremarkable. 3. Chronic diastolic CHF: No volume overload on exam.  She is on a low dose of Lasix.   Followup in 6 months.   [name] 12/04/2015

## 2015-12-06 ENCOUNTER — Telehealth: Payer: Self-pay | Admitting: Cardiology

## 2015-12-06 NOTE — Telephone Encounter (Signed)
Informed pt of lab results. Pt verbalized understanding.

## 2015-12-06 NOTE — Telephone Encounter (Signed)
New message     Pt is calling for lab results

## 2015-12-19 ENCOUNTER — Telehealth: Payer: Self-pay | Admitting: Emergency Medicine

## 2015-12-19 MED ORDER — UMECLIDINIUM-VILANTEROL 62.5-25 MCG/INH IN AEPB: 1.0000 | INHALATION_SPRAY | Freq: Every day | RESPIRATORY_TRACT | Status: DC

## 2015-12-19 NOTE — Telephone Encounter (Signed)
Spoke with the pt to verify the msg  She is doing well with Anoro and wants rx called in  Rx was sent to pharm  Nothing further needed

## 2015-12-23 ENCOUNTER — Encounter (HOSPITAL_COMMUNITY): Payer: Self-pay | Admitting: Psychiatry

## 2015-12-23 ENCOUNTER — Ambulatory Visit (INDEPENDENT_AMBULATORY_CARE_PROVIDER_SITE_OTHER): Payer: Medicare Other | Admitting: Psychiatry

## 2015-12-23 VITALS — BP 111/59 | HR 88 | Ht 65.0 in | Wt 184.6 lb

## 2015-12-23 DIAGNOSIS — F32A Depression, unspecified: Secondary | ICD-10-CM

## 2015-12-23 DIAGNOSIS — F329 Major depressive disorder, single episode, unspecified: Secondary | ICD-10-CM

## 2015-12-23 MED ORDER — DULOXETINE HCL 30 MG PO CPEP: ORAL_CAPSULE | ORAL | Status: DC

## 2015-12-23 NOTE — Progress Notes (Signed)
Psychiatric Initial Adult Assessment   Patient Identification: [name] MRN:  [id number] Date of Evaluation:  12/23/2015 Referral Source: Dr. Willey Blade Chief Complaint:   Chief Complaint    Depression; Anxiety; Establish Care     Visit Diagnosis:    ICD-9-CM ICD-10-CM   1. Depression 311 F32.9    Diagnosis:   Patient Active Problem List   Diagnosis Date Noted  . Depression [F32.9] 12/23/2015  . Peripheral edema [R60.9] 08/23/2015  . Fracture of hip, left, closed ([place]) [S72.002A] 12/08/2013  . Hip fracture ([place]) [S72.009A] 12/08/2013  . PAD (peripheral artery disease) ([place]) [I73.9] 11/25/2013  . Encounter for therapeutic drug monitoring [Z51.81] 11/09/2013  . Atrial fibrillation ([place]) [I48.91] 05/07/2013  . Cough [R05] 03/20/2013  . Total knee replacement status [Z96.659] 10/07/2012  . Knee pain [M25.569] 10/07/2012  . Knee stiffness [M25.669] 10/07/2012  . Tachycardia [R00.0] 09/17/2012  . Chest pain [R07.9] 09/17/2012  . Difficulty in walking(719.7) [R26.2] 09/16/2012  . Muscle weakness (generalized) [M62.81] 09/16/2012  . Postop Acute blood loss anemia [D62] 08/08/2012  . Instability of prosthetic knee ([place]) [Q76.195K, Z96.659] 08/06/2012  . Bloating [R14.0] 02/14/2012  . Upper abdominal pain [R10.10] 02/14/2012  . Allergic rhinitis, seasonal [J30.2] 02/15/2011  . COLITIS [K52.89] 02/20/2010  . DIARRHEA [R19.7] 01/02/2010  . ABDOMINAL PAIN -GENERALIZED [R10.84] 01/02/2010  . PERSONAL HX COLONIC POLYPS [Z86.010] 01/02/2010  . HYPOTHYROIDISM [E03.9] 12/28/2009  . COPD (chronic obstructive pulmonary disease) ([place]) [J44.9] 12/28/2009  . ARTHRITIS [M12.9] 12/28/2009   History of Present Illness:  This patient is an 82 year old divorced white female who lives alone in [place]. She used to work for the [employer] but has been retired for 20 years. She has one daughter one granddaughter and 3 great-grandchildren.  The patient was referred by her primary  physician, Dr. Willey Blade, for further assessment and treatment of depression.  The patient states that she is in somewhat depressed for a few years but it's been worse since March 2015. At that time she fell and broke her hip and required surgery. She had fallen previously in 1997 and fractured her knee. She has had 2 knee replacements. The patient got divorced when she was about 82 years old and always has been very independent. 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  Over recent years she has had more medical disabilities such as atrial fibrillation, the knee replacements hip fracture and COPD. She is no longer able to do the things she used to do such as the lawn and yard work in the house cleaning. She has had to hire people to do this. She states that she feels "useless" and like a burden to other people. She still driving gets her own groceries cooks her own meals is active in church and spends time with family. However she spends much of her time alone.  The patient endorses low mood anhedonia feeling like crying interrupted sleep. She is in chronic pain with her hip and knee. She's had some injections but is very reluctant to use pain medicine and only uses tramadol periodically. Gabapentin has helped to some degree. She denies being suicidal but sometimes feels like she is a burden to others and it would be better if she was not here. She denies psychotic symptoms. She is somewhat anxious but does not have overt panic attacks. She uses low-dose Xanax when she cannot sleep. She does not have any history of substance abuse and  has no history of prior psychiatric treatment or therapy Elements:  Location:  Global. Quality:  Worsening. Severity:  Moderate to severe. Timing:  Daily. Duration:  2 years. Context:  Declining health and chronic pain. Associated Signs/Symptoms: Depression Symptoms:  depressed mood, anhedonia, psychomotor  retardation, fatigue, feelings of worthlessness/guilt, anxiety, loss of energy/fatigue, disturbed sleep,  Anxiety Symptoms:  Excessive Worry,   Past Medical History:  Past Medical History  Diagnosis Date  . Hypothyroidism   . Psoriatic arthritis ([place])   . IBS (irritable bowel syndrome)   . Hemorrhoids   . Diverticulosis   . Rectal polyp   . GERD (gastroesophageal reflux disease)   . Family hx of colon cancer   . Uterine polyp   . COPD (chronic obstructive pulmonary disease) ([place])   . Lymphocytic colitis   . A-fib ([place])   . Arthritis     psoriatic  . History of shingles   . Osteoporosis     hx -osteopenia  . Psoriasis   . Hiatal hernia   . Depression     Past Surgical History  Procedure Laterality Date  . Knee surgery      lt.  Marland Kitchen Uterine polypectomy    . Cholecystectomy  Dec 04, 2010  . Total knee revision  08/06/2012    Procedure: TOTAL KNEE REVISION;  Surgeon: [name], MD;  Location: [facility];  Service: Orthopedics;  Laterality: Left;  Left Total Knee Arthroplasty Revision  . Femur im nail Left 12/11/2013    Procedure: INTRAMEDULLARY (IM) NAIL FEMORAL;  Surgeon: [name], MD;  Location: [facility];  Service: Orthopedics;  Laterality: Left;  . Fracture surgery Left 12/11/2013    hip   Family History:  Family History  Problem Relation Age of Onset  . Heart disease Father     pacemaker  . Lung cancer Father   . Colon cancer Mother   . Colon cancer Maternal Grandfather   . Depression Sister    Social History:   Social History   Social History  . Marital Status: Divorced    Spouse Name: N/A  . Number of Children: 1  . Years of Education: 12   Occupational History  . retired    Social History Main Topics  . Smoking status: Former Smoker -- 1.00 packs/day for 20 years    Types: Cigarettes    Quit date: 10/08/1980  . Smokeless tobacco: Never Used  . Alcohol Use: Yes     Comment: rarely, 12-23-15 rarely  . Drug Use: No  . Sexual Activity: Not  Asked   Other Topics Concern  . None   Social History Narrative   Patient lives at home by herself.    Patient is retired.    Patient has 12 th grade education.          Additional Social History: The patient grew up in [place] with both parents and 4 siblings. [place]. She denies any history of abuse or trauma in childhood. 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 She's been married twice but denies any history of abuse or trauma during her marriages. She has one daughter one granddaughter and 3 great grandchildren whom she sees frequently. She has been retired for 20 years  Musculoskeletal: Strength & Muscle Tone: decreased [place]: unsteady Patient leans: N/A  Psychiatric Specialty Exam: HPI  Review of Systems  Constitutional: Positive for malaise/fatigue.  Respiratory: Positive for shortness of breath.   Musculoskeletal: Positive for back pain, joint pain and falls.  Psychiatric/Behavioral: Positive for depression. The patient has insomnia.     Blood pressure 111/59, pulse 88, height 5' 5"  (1.651 m), weight 184 lb 9.6 oz (83.734 kg), SpO2 93 %.Body mass index is 30.72 kg/(m^2).  General Appearance: Casual, Neat and Well Groomed  Eye Contact:  Good  Speech:  Clear and Coherent  Volume:  Decreased  Mood:  Depressed and Worthless  Affect:  Constricted, Depressed and Tearful  Thought Process:  Goal Directed  Orientation:  Full (Time, Place, and Person)  Thought Content:  Rumination  Suicidal Thoughts:  No  Homicidal Thoughts:  No  Memory:  Immediate;   Good Recent;   Good Remote;   Good  Judgement:  Good  Insight:  Fair  Psychomotor Activity:  Decreased  Concentration:  Fair  Recall:  Good  Fund of Knowledge:Good  Language: Good  Akathisia:  No  Handed:  Right  AIMS (if indicated):    Assets:  Communication Skills Desire for Improvement Resilience Social Support   ADL's:  Intact  Cognition: WNL  Sleep:  poor   Is the patient at risk to self?  No. Has the patient been a risk to self in the past 6 months?  No. Has the patient been a risk to self within the distant past?  No. Is the patient a risk to others?  No. Has the patient been a risk to others in the past 6 months?  No. Has the patient been a risk to others within the distant past?  No.  Allergies:   Allergies  Allergen Reactions  . Codeine Itching   Current Medications: Current Outpatient Prescriptions  Medication Sig Dispense Refill  . acetaminophen (TYLENOL) 325 MG tablet Take 2 tablets (650 mg total) by mouth every 6 (six) hours as needed (or Fever >/= 101). 60 tablet 0  . ALPRAZolam (XANAX) 0.5 MG tablet Take 0.5-1 tablets (0.25-0.5 mg total) by mouth at bedtime as needed. Sleep 30 tablet 0  . CARTIA XT 120 MG 24 hr capsule TAKE 1 CAPSULE BY MOUTH EVERY DAY 90 capsule 0  . clobetasol cream (TEMOVATE) 0.05 % as needed.     . diphenoxylate-atropine (LOMOTIL) 2.5-0.025 MG per tablet Take one tablet by mouth three times daily as needed for diarrhea 90 tablet 5  . fluticasone (FLONASE) 50 MCG/ACT nasal spray Place 2 sprays into both nostrils 2 (two) times daily. For nasal congestion    . furosemide (LASIX) 20 MG tablet Take 1 tablet (20 mg total) by mouth daily. 30 tablet 6  . gabapentin (NEURONTIN) 100 MG capsule Take 100 mg by mouth 2 (two) times daily.     Marland Kitchen ipratropium (ATROVENT) 0.03 % nasal spray 2 sprays each nostril, 2-3 times daily for congestion 30 mL 6  . levalbuterol (XOPENEX HFA) 45 MCG/ACT inhaler Inhale 1-2 puffs into the lungs as needed for wheezing or shortness of breath. Wheezing 1 Inhaler 2  . levothyroxine (SYNTHROID, LEVOTHROID) 125 MCG tablet Take 125 mcg by mouth every morning. For thyroid therapy    . methylcellulose (ARTIFICIAL TEARS) 1 % ophthalmic solution Place 1 drop into both eyes 2 (two) times daily as needed. Dry eyes    . potassium chloride (K-DUR) 10 MEQ  tablet Take 1 tablet (10 mEq total) by mouth daily. 30 tablet 6  . umeclidinium-vilanterol (ANORO ELLIPTA) 62.5-25 MCG/INH AEPB Inhale 1 puff into the lungs daily. 60 each 5  .  XARELTO 20 MG TABS tablet TAKE 1 TABLET BY MOUTH EVERY DAY WITH SUPPER 90 tablet 2  . DULoxetine (CYMBALTA) 30 MG capsule Take one tablet daily for two weeks, then increase to one tablet twice a day 60 capsule 2  . traMADol (ULTRAM) 50 MG tablet     . [DISCONTINUED] Calcium Carbonate (CALCIUM 600) 1500 MG TABS Take 2 tablets by mouth daily.       No current facility-administered medications for this visit.    Previous Psychotropic Medications: Yes   Substance Abuse History in the last 12 months:  No.  Consequences of Substance Abuse: NA  Medical Decision Making:  Review of Psycho-Social Stressors (1), Review or order clinical lab tests (1), Review and summation of old records (2), Established Problem, Worsening (2), Review of Medication Regimen & Side Effects (2) and Review of New Medication or Change in Dosage (2)  Treatment Plan Summary: Medication management   This patient is an 82 year old white female who has a fairly recent history of depression subsequent to decline in her health status and chronic pain. I urged her to use the pain management physician at her orthopedic office to see if anything more can be done for the pain as this is making her depression worse and she agrees. She will also start Cymbalta at 30 mg daily for 2 weeks then advance to 60 mg daily for treatment of depression and chronic pain. She also agrees to counseling here. She'll return in 4 weeks    [place], [facility] 3/17/201711:24 AM

## 2016-01-13 ENCOUNTER — Telehealth: Payer: Self-pay | Admitting: Cardiology

## 2016-01-13 NOTE — Telephone Encounter (Signed)
New message      Request for surgical clearance:  1. What type of surgery is being performed? Back injection  When is this surgery scheduled? 02-01-16 Are there any medications that need to be held prior to surgery and how long? Hold xarelto 3 days prior Name of physician performing surgery?  Dr Nelva Bush 2. What is your office phone and fax number?  [phone]

## 2016-01-13 NOTE — Telephone Encounter (Signed)
I will forward to Dr McLean for review 

## 2016-01-13 NOTE — Telephone Encounter (Signed)
Pt advised per Dr Aundra Dubin okay to hold Xarelto 3 days prior to back injection.

## 2016-01-13 NOTE — Telephone Encounter (Signed)
I will fax this to Dr Nelva Bush.

## 2016-01-13 NOTE — Telephone Encounter (Signed)
Ok to hold Xarelto x 3 days.

## 2016-01-18 ENCOUNTER — Telehealth (HOSPITAL_COMMUNITY): Payer: Self-pay | Admitting: *Deleted

## 2016-01-18 ENCOUNTER — Ambulatory Visit (INDEPENDENT_AMBULATORY_CARE_PROVIDER_SITE_OTHER): Payer: Medicare Other | Admitting: Psychiatry

## 2016-01-18 ENCOUNTER — Encounter (HOSPITAL_COMMUNITY): Payer: Self-pay | Admitting: Psychiatry

## 2016-01-18 DIAGNOSIS — F329 Major depressive disorder, single episode, unspecified: Secondary | ICD-10-CM

## 2016-01-18 DIAGNOSIS — F32A Depression, unspecified: Secondary | ICD-10-CM
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
## 2016-01-18 NOTE — Telephone Encounter (Signed)
Pt is here today to see another doctor. Per pt, since she started the increased dose of there Cymbalta, she's been itching more. Per pt she would like to know what to do. [phone]

## 2016-01-18 NOTE — Telephone Encounter (Signed)
Per pt she cant see anything she stated she feels like she should go back to the 30 mg QD.

## 2016-01-18 NOTE — Telephone Encounter (Signed)
lmtcb

## 2016-01-18 NOTE — Patient Instructions (Signed)
Discussed orallly

## 2016-01-18 NOTE — Telephone Encounter (Signed)
Does she have a rash? If its really bothering her she can go back to 30 mg daily

## 2016-01-19 ENCOUNTER — Encounter (HOSPITAL_COMMUNITY): Payer: Self-pay | Admitting: Psychiatry

## 2016-01-19 ENCOUNTER — Ambulatory Visit (INDEPENDENT_AMBULATORY_CARE_PROVIDER_SITE_OTHER): Payer: Medicare Other | Admitting: Psychiatry

## 2016-01-19 VITALS — BP 108/67 | HR 85 | Ht 65.0 in | Wt 182.6 lb

## 2016-01-19 DIAGNOSIS — F32A Depression, unspecified: Secondary | ICD-10-CM

## 2016-01-19 DIAGNOSIS — F329 Major depressive disorder, single episode, unspecified: Secondary | ICD-10-CM | POA: Diagnosis not present

## 2016-01-19 MED ORDER — ESCITALOPRAM OXALATE 10 MG PO TABS: 10.0000 mg | ORAL_TABLET | Freq: Every day | ORAL | Status: DC

## 2016-01-19 NOTE — Progress Notes (Signed)
Patient ID: [name], female   DOB: 04/01/1933, 82 y.o.   MRN: [id number]   Psychiatric Adult follow-up  Patient Identification: [name] MRN:  [id number] Date of Evaluation:  01/19/2016 Referral Source: Dr. Willey Blade Chief Complaint:   Chief Complaint    Depression; Anxiety; Follow-up     Visit Diagnosis:    ICD-9-CM ICD-10-CM   1. Depression 311 F32.9    Diagnosis:   Patient Active Problem List   Diagnosis Date Noted  . Depression [F32.9] 12/23/2015  . Peripheral edema [R60.9] 08/23/2015  . Fracture of hip, left, closed ([place]) [S72.002A] 12/08/2013  . Hip fracture ([place]) [S72.009A] 12/08/2013  . PAD (peripheral artery disease) ([place]) [I73.9] 11/25/2013  . Encounter for therapeutic drug monitoring [Z51.81] 11/09/2013  . Atrial fibrillation ([place]) [I48.91] 05/07/2013  . Cough [R05] 03/20/2013  . Total knee replacement status [Z96.659] 10/07/2012  . Knee pain [M25.569] 10/07/2012  . Knee stiffness [M25.669] 10/07/2012  . Tachycardia [R00.0] 09/17/2012  . Chest pain [R07.9] 09/17/2012  . Difficulty in walking(719.7) [R26.2] 09/16/2012  . Muscle weakness (generalized) [M62.81] 09/16/2012  . Postop Acute blood loss anemia [D62] 08/08/2012  . 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 Bloating [R14.0] 02/14/2012  . Upper abdominal pain [R10.10] 02/14/2012  . Allergic rhinitis, seasonal [J30.2] 02/15/2011  . COLITIS [K52.89] 02/20/2010  . DIARRHEA [R19.7] 01/02/2010  . ABDOMINAL PAIN -GENERALIZED [R10.84] 01/02/2010  . PERSONAL HX COLONIC POLYPS [Z86.010] 01/02/2010  . HYPOTHYROIDISM [E03.9] 12/28/2009  . COPD (chronic obstructive pulmonary disease) ([place]) [J44.9] 12/28/2009  . ARTHRITIS [M12.9] 12/28/2009   History of Present Illness:  This patient is an 82 year old divorced white female who lives alone in [place]. She used to work for the [employer] but has been retired for 20 years. She has one daughter one granddaughter and  3 great-grandchildren.  The patient was referred by her primary physician, Dr. Willey Blade, for further assessment and treatment of depression.  The patient states that she is in somewhat depressed for a few years but it's been worse since March 2015. At that time she fell and broke her hip and required surgery. She had fallen previously in 1997 and fractured her knee. She has had 2 knee replacements. The patient got divorced when she was about 82 years old she was about 82 years old and always has been very independent. 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  Over recent years she has had more medical disabilities such as atrial fibrillation, the knee replacements hip fracture and COPD. She is no longer able to do the things she used to do such as the lawn and yard work in the house cleaning. She has had to hire people to do this. She states that she feels "useless" and like a burden to other people. She still driving gets her own groceries cooks her own meals is active in church and spends time with family. However she spends much of her time alone.  The patient endorses low mood anhedonia feeling like crying interrupted sleep. She is in chronic pain with her hip and knee. She's had some injections but is very reluctant to use pain medicine and only uses tramadol periodically. Gabapentin has helped to some degree. She denies being suicidal but sometimes feels like she is a burden to others and it would be better if she was not here. She denies psychotic symptoms. She is somewhat anxious but does not have overt panic attacks. She uses low-dose  Xanax when she cannot sleep. She does not have any history of substance abuse and has no history of prior psychiatric treatment or therapy  The patient was started on Cymbalta and returns in 80 weeks. She called yesterday and said she couldn't stop itching. I suggested she cut the Cymbalta back but as soon as she took the 30 mg pill  this morning she was itching terribly. She does not have a rash. Her mood has gotten better on the Cymbalta so she would like to stay on some form of an antidepressant. I suggested we try Lexapro to low-dose and she agrees Elements:  Location:  Global. Quality:  Worsening. Severity:  Moderate to severe. Timing:  Daily. Duration:  2 years. Context:  Declining health and chronic pain. Associated Signs/Symptoms: Depression Symptoms:  depressed mood, anhedonia, psychomotor retardation, fatigue, feelings of worthlessness/guilt, anxiety, loss of energy/fatigue, disturbed sleep,  Anxiety Symptoms:  Excessive Worry,   Past Medical History:  Past Medical History  Diagnosis Date  . Hypothyroidism   . Psoriatic arthritis ([place])   . IBS (irritable bowel syndrome)   . Hemorrhoids   . Diverticulosis   . Rectal polyp   . GERD (gastroesophageal reflux disease)   . Family hx of colon cancer   . Uterine polyp   . COPD (chronic obstructive pulmonary disease) ([place])   . Lymphocytic colitis   . A-fib ([place])   . Arthritis     psoriatic  . History of shingles   . Osteoporosis     hx -osteopenia  . Psoriasis   . Hiatal hernia   . Depression     Past Surgical History  Procedure Laterality Date  . Knee surgery      lt.  Marland Kitchen Uterine polypectomy    . Cholecystectomy  Dec 04, 2010  . Total knee revision  08/06/2012    Procedure: TOTAL KNEE REVISION;  Surgeon: [name], MD;  Location: [facility];  Service: Orthopedics;  Laterality: Left;  Left Total Knee Arthroplasty Revision  . Femur im nail Left 12/11/2013    Procedure: INTRAMEDULLARY (IM) NAIL FEMORAL;  Surgeon: [name], MD;  Location: [facility];  Service: Orthopedics;  Laterality: Left;  . Fracture surgery Left 12/11/2013    hip   Family History:  Family History  Problem Relation Age of Onset  . Heart disease Father     pacemaker  . Lung cancer Father   . Colon cancer Mother   . Colon cancer Maternal Grandfather   . Depression  Sister    Social History:   Social History   Social History  . Marital Status: Divorced    Spouse Name: N/A  . Number of Children: 1  . Years of Education: 12   Occupational History  . retired    Social History Main Topics  . Smoking status: Former Smoker -- 1.00 packs/day for 20 years    Types: Cigarettes    Quit date: 10/08/1980  . Smokeless tobacco: Never Used  . Alcohol Use: Yes     Comment: rarely, 12-23-15 rarely  . Drug Use: No  . Sexual Activity: Not Asked   Other Topics Concern  . None   Social History Narrative   Patient lives at home by herself.    Patient is retired.    Patient has 12 th grade education.          Additional Social History: The patient grew up in [place] with both parents and 4 siblings. She [place]. She denies any history of abuse or trauma in childhood. 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 She's been married twice but denies any history of abuse or trauma during her marriages. She has one daughter one granddaughter and 3 great grandchildren whom she sees frequently. She has been retired for 20 years  Musculoskeletal: Strength & Muscle Tone: decreased [place]: unsteady Patient leans: N/A  Psychiatric Specialty Exam: Depression        Associated symptoms include insomnia.  Past medical history includes anxiety.   Anxiety Symptoms include insomnia and shortness of breath.      Review of Systems  Constitutional: Positive for malaise/fatigue.  Respiratory: Positive for shortness of breath.   Musculoskeletal: Positive for back pain, joint pain and falls.  Psychiatric/Behavioral: Positive for depression. The patient has insomnia.     Blood pressure 108/67, pulse 85, height 5' 5"  (1.651 m), weight 182 lb 9.6 oz (82.827 kg), SpO2 92 %.Body mass index is 30.39 kg/(m^2).  General Appearance: Casual, Neat and Well Groomed  Eye Contact:  Good   Speech:  Clear and Coherent  Volume:  Decreased  Mood:  Improved   Affect: Brighter   Thought Process:  Goal Directed  Orientation:  Full (Time, Place, and Person)  Thought Content:  Rumination  Suicidal Thoughts:  No  Homicidal Thoughts:  No  Memory:  Immediate;   Good Recent;   Good Remote;   Good  Judgement:  Good  Insight:  Fair  Psychomotor Activity:  Decreased  Concentration:  Fair  Recall:  Good  Fund of Knowledge:Good  Language: Good  Akathisia:  No  Handed:  Right  AIMS (if indicated):    Assets:  Communication Skills Desire for Improvement Resilience Social Support  ADL's:  Intact  Cognition: WNL  Sleep:  poor   Is the patient at risk to self?  No. Has the patient been a risk to self in the past 6 months?  No. Has the patient been a risk to self within the distant past?  No. Is the patient a risk to others?  No. Has the patient been a risk to others in the past 6 months?  No. Has the patient been a risk to others within the distant past?  No.  Allergies:   Allergies  Allergen Reactions  . Codeine Itching  . Cymbalta [Duloxetine Hcl] Itching   Current Medications: Current Outpatient Prescriptions  Medication Sig Dispense Refill  . acetaminophen (TYLENOL) 325 MG tablet Take 2 tablets (650 mg total) by mouth every 6 (six) hours as needed (or Fever >/= 101). 60 tablet 0  . ALPRAZolam (XANAX) 0.5 MG tablet Take 0.5-1 tablets (0.25-0.5 mg total) by mouth at bedtime as needed. Sleep 30 tablet 0  . CARTIA XT 120 MG 24 hr capsule TAKE 1 CAPSULE BY MOUTH EVERY DAY 90 capsule 0  . clobetasol cream (TEMOVATE) 0.05 % as needed.     . diphenoxylate-atropine (LOMOTIL) 2.5-0.025 MG per tablet Take one tablet by mouth three times daily as needed for diarrhea 90 tablet 5  . fluticasone (FLONASE) 50 MCG/ACT nasal spray Place 2 sprays into both nostrils 2 (two) times daily. For nasal congestion    . furosemide (LASIX) 20 MG tablet Take 1 tablet (20 mg total) by mouth  daily. 30 tablet 6  . gabapentin (NEURONTIN) 100 MG capsule Take 100 mg by mouth 2 (two) times daily.     Marland Kitchen  ipratropium (ATROVENT) 0.03 % nasal spray 2 sprays each nostril, 2-3 times daily for congestion 30 mL 6  . levalbuterol (XOPENEX HFA) 45 MCG/ACT inhaler Inhale 1-2 puffs into the lungs as needed for wheezing or shortness of breath. Wheezing 1 Inhaler 2  . levothyroxine (SYNTHROID, LEVOTHROID) 125 MCG tablet Take 125 mcg by mouth every morning. For thyroid therapy    . methylcellulose (ARTIFICIAL TEARS) 1 % ophthalmic solution Place 1 drop into both eyes 2 (two) times daily as needed. Dry eyes    . potassium chloride (K-DUR) 10 MEQ tablet Take 1 tablet (10 mEq total) by mouth daily. 30 tablet 6  . traMADol (ULTRAM) 50 MG tablet Take 50 mg by mouth every 6 (six) hours as needed (for pain).     Marland Kitchen umeclidinium-vilanterol (ANORO ELLIPTA) 62.5-25 MCG/INH AEPB Inhale 1 puff into the lungs daily. 60 each 5  . XARELTO 20 MG TABS tablet TAKE 1 TABLET BY MOUTH EVERY DAY WITH SUPPER 90 tablet 2  . escitalopram (LEXAPRO) 10 MG tablet Take 1 tablet (10 mg total) by mouth daily. 30 tablet 2  . [DISCONTINUED] Calcium Carbonate (CALCIUM 600) 1500 MG TABS Take 2 tablets by mouth daily.       No current facility-administered medications for this visit.    Previous Psychotropic Medications: Yes   Substance Abuse History in the last 12 months:  No.  Consequences of Substance Abuse: NA  Medical Decision Making:  Review of Psycho-Social Stressors (1), Review or order clinical lab tests (1), Review and summation of old records (2), Established Problem, Worsening (2), Review of Medication Regimen & Side Effects (2) and Review of New Medication or Change in Dosage (2)  Treatment Plan Summary: Medication management   The patient will discontinue Cymbalta and start Lexapro 10 mg daily. She'll continue low-dose Xanax as needed for anxiety. She'll continue her therapy and return to see me in 4 weeks    ROSS,  [address] [facility] 4/13/20174:28 PM

## 2016-01-24 ENCOUNTER — Encounter: Payer: Self-pay | Admitting: Internal Medicine

## 2016-01-24 ENCOUNTER — Other Ambulatory Visit (INDEPENDENT_AMBULATORY_CARE_PROVIDER_SITE_OTHER): Payer: Medicare Other

## 2016-01-24 ENCOUNTER — Ambulatory Visit (INDEPENDENT_AMBULATORY_CARE_PROVIDER_SITE_OTHER): Payer: Medicare Other | Admitting: Internal Medicine

## 2016-01-24 VITALS — BP 122/64 | HR 88 | Ht 63.75 in | Wt 181.2 lb

## 2016-01-24 DIAGNOSIS — R159 Full incontinence of feces: Secondary | ICD-10-CM

## 2016-01-24 DIAGNOSIS — R109 Unspecified abdominal pain: Secondary | ICD-10-CM

## 2016-01-24 DIAGNOSIS — R197 Diarrhea, unspecified: Secondary | ICD-10-CM

## 2016-01-24 DIAGNOSIS — K52832 Lymphocytic colitis: Secondary | ICD-10-CM

## 2016-01-24 LAB — CREATININE, SERUM: Creatinine, Ser: 0.92 mg/dL (ref 0.40–1.20)

## 2016-01-24 LAB — BUN: BUN: 15 mg/dL (ref 6–23)

## 2016-01-24 MED ORDER — PREDNISONE 20 MG PO TABS: 20.0000 mg | ORAL_TABLET | Freq: Every day | ORAL | Status: DC

## 2016-01-24 NOTE — Patient Instructions (Addendum)
We have sent the following medications to your pharmacy for you to pick up at your convenience: Prednisone 20 mg daily  Please follow up with Dr Henrene Pastor on Tuesday, 03/06/16 @ 9 am.   Your physician has requested that you go to the basement for the following lab work before leaving today: BUN, Creatinine  ______________________________________________________________________________________________________________________________________ [name] have been scheduled for a CT scan of the abdomen and pelvis at [facility] ([address]---this is in the same building as Press [facility]).   You are scheduled on Thursday, 01/26/16  at 1:30 pm. You should arrive 15 minutes prior to your appointment time for registration. Please follow the written instructions below on the day of your exam:  WARNING: IF YOU ARE ALLERGIC TO IODINE/X-RAY DYE, PLEASE NOTIFY RADIOLOGY IMMEDIATELY AT [phone]! YOU WILL BE GIVEN A 13 HOUR PREMEDICATION PREP.  1) Do not eat or drink anything after 9:30 am (4 hours prior to your test) 2) You have been given 2 bottles of oral contrast to drink. The solution may taste better if refrigerated, but do NOT add ice or any other liquid to this solution. Shake well before drinking.    Drink 1 bottle of contrast @ 11:30 am (2 hours prior to your exam)  Drink 1 bottle of contrast @ 12:30 pm (1 hour prior to your exam)  You may take any medications as prescribed with a small amount of water except for the following: Metformin, Glucophage, Glucovance, Avandamet, Riomet, Fortamet, Actoplus Met, Janumet, Glumetza or Metaglip. The above medications must be held the day of the exam AND 48 hours after the exam.  The purpose of you drinking the oral contrast is to aid in the visualization of your intestinal tract. The contrast solution may cause some diarrhea. Before your exam is started, you will be given a small amount of fluid to drink. Depending on your individual set of  symptoms, you may also receive an intravenous injection of x-ray contrast/dye. Plan on being at [name] for 30 minutes or long, depending on the type of exam you are having performed.  If you have any questions regarding your exam or if you need to reschedule, you may call the CT department at [phone] between the hours of 8:00 am and 5:00 pm, Monday-Friday.  ________________________________________________________________________  If you are age 80 or older, your body mass index should be between 23-30. Your Body mass index is 31.37 kg/(m^2). If this is out of the aforementioned range listed, please consider follow up with your Primary Care Provider.  If you are age 80 or younger, your body mass index should be between 19-25. Your Body mass index is 31.37 kg/(m^2). If this is out of the aformentioned range listed, please consider follow up with your Primary Care Provider.

## 2016-01-24 NOTE — Progress Notes (Signed)
HISTORY OF PRESENT ILLNESS:  [name] is a 82 y.o. female with multiple medical problems as listed below including chronic atrial fibrillation for which she is on chronic anticoagulation therapy. She has a history of lymphocytic colitis which has been treated most recently with Lomotil. Cannot afford budesonide. Apparently treated remotely with prednisone which she tolerated and it helped. Last evaluated in the office 04/01/2015. See that dictation. She wanted to continue with Lomotil. She presents today with her daughter. First chief complaint is that of ongoing somewhat worsening diarrhea. She takes up to 6 Lomotil per day. She is having significant urgency and episodes of incontinence. No weight loss. No bleeding. No fevers. Next, she reports 6 month history of vague mid abdominal discomfort without exacerbating or relieving factors. She has noticed that her abdominal girth has increased, which concerns her. Her last upper endoscopy was June 2013 with incidental esophageal ring. Her last complete colonoscopy April 2011.  REVIEW OF SYSTEMS:  All non-GI ROS negative except for sinus and allergy, arthritis, back pain, irregular heart rate, hearing problems, sleeping problems, intermittent ankle edema, shortness of breath  Past Medical History  Diagnosis Date  . Hypothyroidism   . Psoriatic arthritis ([place])   . IBS (irritable bowel syndrome)   . Hemorrhoids   . Diverticulosis   . Rectal polyp   . GERD (gastroesophageal reflux disease)   . Family hx of colon cancer   . Uterine polyp   . COPD (chronic obstructive pulmonary disease) ([place])   . Lymphocytic colitis   . A-fib ([facility])   . Arthritis     psoriatic  . History of shingles   . Osteoporosis     hx -osteopenia  . Psoriasis   . Hiatal hernia   . Depression   . Schatzki's ring     Past Surgical History  Procedure Laterality Date  . Knee surgery      lt.  Marland Kitchen Uterine polypectomy    . Cholecystectomy  Dec 04, 2010  . Total knee  revision  08/06/2012    Procedure: TOTAL KNEE REVISION;  Surgeon: [name], MD;  Location: [facility];  Service: Orthopedics;  Laterality: Left;  Left Total Knee Arthroplasty Revision  . Femur im nail Left 12/11/2013    Procedure: INTRAMEDULLARY (IM) NAIL FEMORAL;  Surgeon: [name], MD;  Location: [facility];  Service: Orthopedics;  Laterality: Left;  . Fracture surgery Left 12/11/2013    hip    Social History [name]  reports that she quit smoking about 35 years ago. Her smoking use included Cigarettes. She has a 20 pack-year smoking history. She has never used smokeless tobacco. She reports that she drinks alcohol. She reports that she does not use illicit drugs.  family history includes Colon cancer in her maternal grandfather and mother; Depression in her sister; Heart disease in her father; Lung cancer in her father.  Allergies  Allergen Reactions  . Codeine Itching  . Cymbalta [Duloxetine Hcl] Itching       PHYSICAL EXAMINATION: Vital signs: BP 122/64 mmHg  Pulse 88  Ht 5' 3.75" (1.619 m)  Wt 181 lb 4 oz (82.214 kg)  BMI 31.37 kg/m2  Constitutional: generally well-appearing, no acute distress Psychiatric: alert and oriented x3, cooperative Eyes: extraocular movements intact, anicteric, conjunctiva pink Mouth: oral pharynx moist, no lesions Neck: supple no lymphadenopathy Cardiovascular: heart regular rate and rhythm, no murmur Lungs: clear to auscultation bilaterally Abdomen: soft, obese, mild midabdominal discomfort without obvious mass though slightly full  omitted, nondistended, no obvious ascites, no peritoneal signs, normal bowel sounds, no organomegaly Rectal: Omitted Extremities: no clubbing cyanosis or significant lower extremity edema bilaterally (trace only) Skin: no lesions on visible extremities Neuro: No focal deficits. Normal DTRs.  ASSESSMENT:  #1. Diarrhea. Secondary to microscopic colitis #2. Incontinence. Secondary to the same #3.  Abdominal fullness with vague abdominal discomfort. Needs further evaluation given her sister nature   PLAN:  #1. Again discussed multiple treatment options for microscopic colitis #2. Initiate prednisone 20 mg daily given inability to afford budesonide #3. Schedule CT scan of the abdomen and pelvis to evaluate abdominal discomfort and fullness, worsening #4. Continue to use Lomotil #5. Recommended protective undergarments #6. Office follow-up 4 weeks. Contact the office in the interim for questions or problems  40 minutes spent face-to-face with the patient and her daughter. Greater than 50% a time use for counseling regarding her GI complaints as listed above and current recommendations

## 2016-01-26 ENCOUNTER — Ambulatory Visit (INDEPENDENT_AMBULATORY_CARE_PROVIDER_SITE_OTHER)
Admission: RE | Admit: 2016-01-26 | Discharge: 2016-01-26 | Disposition: A | Discharge status: Home / Self Care | Payer: Medicare Other | Source: Ambulatory Visit | Attending: Internal Medicine | Admitting: Internal Medicine

## 2016-01-26 DIAGNOSIS — R109 Unspecified abdominal pain: Secondary | ICD-10-CM

## 2016-01-26 MED ORDER — IOPAMIDOL (ISOVUE-300) INJECTION 61%
100.0000 mL | Freq: Once | INTRAVENOUS | Status: AC | PRN
  Administered 2016-01-26: 100 mL via INTRAVENOUS

## 2016-02-02 ENCOUNTER — Ambulatory Visit (HOSPITAL_COMMUNITY): Payer: Self-pay | Admitting: Psychiatry

## 2016-02-02 ENCOUNTER — Other Ambulatory Visit: Payer: Self-pay | Admitting: Cardiovascular Disease

## 2016-02-06 ENCOUNTER — Telehealth: Payer: Self-pay | Admitting: Internal Medicine

## 2016-02-06 MED ORDER — BUDESONIDE 3 MG PO CPEP: 9.0000 mg | ORAL_CAPSULE | Freq: Every day | ORAL | Status: DC

## 2016-02-06 NOTE — Telephone Encounter (Signed)
Pt aware and script sent to pharmacy.

## 2016-02-06 NOTE — Telephone Encounter (Signed)
Okay. Budesonide or Entocort or Uceris 9 mg daily

## 2016-02-06 NOTE — Telephone Encounter (Signed)
Pt states she was prescribed prednisone at visit on 01/24/16 due to the cost of budesonide. Pt states the side effects are really bothering her, inability to sleep, reflux, and irritability. Pt would like to try the budesonide. Please advise.

## 2016-02-15 ENCOUNTER — Telehealth (HOSPITAL_COMMUNITY): Payer: Self-pay | Admitting: *Deleted

## 2016-02-15 NOTE — Telephone Encounter (Signed)
Per pt, she never did start any of the medications that Dr. Harrington Challenger put her on. Per pt, she still want Dr. Harrington Challenger to keep her chart open and after she find out her results for her CAT Scan she might want to come back. Per pt, she picked up medications from pharmacy but never started it.

## 2016-02-15 NOTE — Telephone Encounter (Signed)
phone call from patient to cancel appointments with Dr. Harrington Challenger and Vickii Chafe, due to medical issues.   She did not wish to reschedule, she has to take care of her medical issue.

## 2016-02-15 NOTE — Telephone Encounter (Signed)
Is she going to follow up with pcp for her lexapro?

## 2016-02-16 ENCOUNTER — Ambulatory Visit (HOSPITAL_COMMUNITY): Payer: Self-pay | Admitting: Psychiatry

## 2016-02-16 NOTE — Telephone Encounter (Signed)
ok 

## 2016-02-17 ENCOUNTER — Ambulatory Visit (HOSPITAL_COMMUNITY): Payer: Self-pay | Admitting: Psychiatry

## 2016-03-06 ENCOUNTER — Ambulatory Visit (INDEPENDENT_AMBULATORY_CARE_PROVIDER_SITE_OTHER): Payer: Medicare Other | Admitting: Internal Medicine

## 2016-03-06 ENCOUNTER — Encounter: Payer: Self-pay | Admitting: Internal Medicine

## 2016-03-06 VITALS — BP 106/60 | HR 78 | Ht 64.0 in | Wt 181.0 lb

## 2016-03-06 DIAGNOSIS — R197 Diarrhea, unspecified: Secondary | ICD-10-CM

## 2016-03-06 NOTE — Progress Notes (Signed)
HISTORY OF PRESENT ILLNESS:  [name] is a 82 y.o. female who presents for follow-up regarding management of her microscopic colitis. Last evaluated 01/24/2016. See that dictation. She is accompanied today by her daughter [name]. She was unable to afford budesonide. Thus started on prednisone 20 mg daily. Has been taking this sporadically as this has made her feel jittery. No problems with prednisone in the past. Overall she has had improvement in her bowels. Using Lomotil. Less incontinence. Still with multiple bowel movements per day. No new issues.  REVIEW OF SYSTEMS:  All non-GI ROS negative upon review  Past Medical History  Diagnosis Date  . Hypothyroidism   . Psoriatic arthritis ([place])   . IBS (irritable bowel syndrome)   . Hemorrhoids   . Diverticulosis   . Rectal polyp   . GERD (gastroesophageal reflux disease)   . Family hx of colon cancer   . Uterine polyp   . COPD (chronic obstructive pulmonary disease) ([place])   . Lymphocytic colitis   . A-fib ([place])     with flutter  . Arthritis     psoriatic  . History of shingles   . Osteoporosis     hx -osteopenia  . Psoriasis   . Hiatal hernia   . Depression   . Schatzki's ring   . Fibromyalgia     Past Surgical History  Procedure Laterality Date  . Knee surgery Left     x 2  . Uterine polypectomy    . Cholecystectomy  Dec 04, 2010  . Total knee revision  08/06/2012    Procedure: TOTAL KNEE REVISION;  Surgeon: [name], MD;  Location: [facility];  Service: Orthopedics;  Laterality: Left;  Left Total Knee Arthroplasty Revision  . Femur im nail Left 12/11/2013    Procedure: INTRAMEDULLARY (IM) NAIL FEMORAL;  Surgeon: [name], MD;  Location: [facility];  Service: Orthopedics;  Laterality: Left;  . Fracture surgery Left 12/11/2013    hip    Social History UGOCHI HENZLER  reports that she quit smoking about 35 years ago. Her smoking use included Cigarettes. She has a 20 pack-year smoking history. She has never  used smokeless tobacco. She reports that she drinks alcohol. She reports that she does not use illicit drugs.  family history includes Colon cancer in her maternal grandfather and mother; Depression in her sister; Heart disease in her father; Lung cancer in her father.  Allergies  Allergen Reactions  . Codeine Itching  . Cymbalta [Duloxetine Hcl] Itching       PHYSICAL EXAMINATION: Vital signs: BP 106/60 mmHg  Pulse 78  Ht 5' 4"  (1.626 m)  Wt 181 lb (82.101 kg)  BMI 31.05 kg/m2 General: Well-developed, well-nourished, no acute distress HEENT: Sclerae are anicteric, Abdomen: Not reexamined Psychiatric: alert and oriented x3. Cooperative   ASSESSMENT:  1. Microscopic colitis with resultant diarrhea. Improved with sporadic prednisone and Lomotil 2. Incontinence. Secondary to the same. Improved   PLAN:  1. Try lower-dose prednisone 10 mg daily. If intolerant, try 5 mg daily. If intolerant, stop 2. Continue Lomotil 3. Continue protective undergarments 4. Office follow-up 2 months  15 minutes spent face-to-face with the patient. Greater than 50% a time use for counseling of her microscopic colitis and answering questions from her and her daughter.

## 2016-03-06 NOTE — Patient Instructions (Signed)
Please follow up in 2 months. 

## 2016-04-20 ENCOUNTER — Other Ambulatory Visit: Payer: Self-pay | Admitting: Cardiology

## 2016-05-17 ENCOUNTER — Encounter (HOSPITAL_COMMUNITY)
Admission: RE | Admit: 2016-05-17 | Discharge: 2016-05-17 | Disposition: A | Discharge status: Home / Self Care | Payer: Medicare Other | Source: Ambulatory Visit | Attending: Internal Medicine | Admitting: Internal Medicine

## 2016-05-17 ENCOUNTER — Encounter (HOSPITAL_COMMUNITY): Payer: Self-pay

## 2016-05-17 DIAGNOSIS — M810 Age-related osteoporosis without current pathological fracture: Secondary | ICD-10-CM | POA: Insufficient documentation

## 2016-05-17 MED ORDER — DENOSUMAB 60 MG/ML ~~LOC~~ SOLN
60.0000 mg | Freq: Once | SUBCUTANEOUS | Status: AC
  Administered 2016-05-17: 60 mg via SUBCUTANEOUS
  Filled 2016-05-17: qty 1

## 2016-05-23 ENCOUNTER — Encounter (INDEPENDENT_AMBULATORY_CARE_PROVIDER_SITE_OTHER): Payer: Self-pay

## 2016-05-23 ENCOUNTER — Telehealth: Payer: Self-pay | Admitting: Internal Medicine

## 2016-05-23 ENCOUNTER — Ambulatory Visit (INDEPENDENT_AMBULATORY_CARE_PROVIDER_SITE_OTHER): Payer: Medicare Other | Admitting: Internal Medicine

## 2016-05-23 ENCOUNTER — Encounter: Payer: Self-pay | Admitting: Internal Medicine

## 2016-05-23 VITALS — BP 120/68 | HR 100 | Ht 63.75 in | Wt 183.5 lb

## 2016-05-23 DIAGNOSIS — R159 Full incontinence of feces: Secondary | ICD-10-CM

## 2016-05-23 DIAGNOSIS — R197 Diarrhea, unspecified: Secondary | ICD-10-CM | POA: Diagnosis not present

## 2016-05-23 DIAGNOSIS — K219 Gastro-esophageal reflux disease without esophagitis: Secondary | ICD-10-CM

## 2016-05-23 DIAGNOSIS — K52838 Other microscopic colitis: Secondary | ICD-10-CM

## 2016-05-23 MED ORDER — CHOLESTYRAMINE 4 G PO PACK: 4.0000 g | PACK | Freq: Two times a day (BID) | ORAL | Status: DC

## 2016-05-23 MED ORDER — DIPHENOXYLATE-ATROPINE 2.5-0.025 MG PO TABS: 1.0000 | ORAL_TABLET | Freq: Four times a day (QID) | ORAL | 0 refills | Status: DC | PRN

## 2016-05-23 NOTE — Patient Instructions (Addendum)
We have sent the following medications to your pharmacy for you to pick up at your convenience:  Questran  Continue to take Lomotil   Please follow up on 08/21/2016 at 10:00am

## 2016-05-23 NOTE — Progress Notes (Signed)
HISTORY OF PRESENT ILLNESS:  [name] is a 82 y.o. female with multiple medical problems as listed below who is followed in this office for GERD and symptomatic microscopic colitis. She was last evaluated 03/06/2016. See that dictation. She could not afford budesonide. He was started on prednisone 20 mg daily but did not tolerate. Prednisone was decreased 10 mg daily. This was tolerated but not effective. Since her last visit she has been using Lomotil 4 daily. This has resulted in improvement but still with significant symptoms. She has 3-4 loose bowel movements per day. Incontinent episodes are less. She is seeing gynecology regarding a right adnexal mass. No new complaints  REVIEW OF SYSTEMS:  All non-GI ROS negative except for arthritis, back pain, muscle cramps, shortness of breath, skin rash clubbing cyanosis or  Past Medical History:  Diagnosis Date  . A-fib ([place])    with flutter  . Arthritis    psoriatic  . COPD (chronic obstructive pulmonary disease) ([place])   . Depression   . Diverticulosis   . Family hx of colon cancer   . Fibromyalgia   . GERD (gastroesophageal reflux disease)   . Hemorrhoids   . Hiatal hernia   . History of shingles   . Hypothyroidism   . IBS (irritable bowel syndrome)   . Lymphocytic colitis   . Osteoporosis    hx -osteopenia  . Psoriasis   . Psoriatic arthritis ([place])   . Rectal polyp   . Schatzki's ring   . Uterine polyp     Past Surgical History:  Procedure Laterality Date  . CHOLECYSTECTOMY  Dec 04, 2010  . FEMUR IM NAIL Left 12/11/2013   Procedure: INTRAMEDULLARY (IM) NAIL FEMORAL;  Surgeon: [name], MD;  Location: [facility];  Service: Orthopedics;  Laterality: Left;  . FRACTURE SURGERY Left 12/11/2013   hip  . KNEE SURGERY Left    x 2  . TOTAL KNEE REVISION  08/06/2012   Procedure: TOTAL KNEE REVISION;  Surgeon: [name], MD;  Location: [facility];  Service: Orthopedics;  Laterality: Left;  Left Total Knee Arthroplasty  Revision  . uterine polypectomy      Social History KRISSI LUSKY  reports that she quit smoking about 35 years ago. Her smoking use included Cigarettes. She has a 20.00 pack-year smoking history. She has never used smokeless tobacco. She reports that she drinks alcohol. She reports that she does not use drugs.  family history includes Colon cancer in her maternal grandfather and mother; Depression in her sister; Heart disease in her father; Lung cancer in her father.  Allergies  Allergen Reactions  . Codeine Itching  . Cymbalta [Duloxetine Hcl] Itching       PHYSICAL EXAMINATION: Vital signs: BP 120/68   Pulse 100   Ht 5' 3.75" (1.619 m)   Wt 183 lb 8 oz (83.2 kg)   BMI 31.75 kg/m  General: Well-developed, well-nourished, no acute distress HEENT: Sclerae are anicteric, conjunctiva pink. Oral mucosa intact Lungs: Clear Heart: Regular Abdomen: soft, nontender, nondistended, no obvious ascites, no peritoneal signs, normal bowel sounds. No organomegaly. Extremities: No edema Psychiatric: alert and oriented x3. Cooperative     ASSESSMENT:  #1. Microscopic colitis with resultant diarrhea. Ongoing. Improved with Lomotil though persistent. #2. Mild fecal incontinence secondary to the same #3. GERD. No longer on PPI  PLAN:  #1. Continue Lomotil. Refilled #2. Try Questran 4 g twice a day. Not within 2 hours of other medications. #3. We will try to  see if the pharmaceutical company can get her samples of budesonide to try to see if this effective #4. Continue management of right adnexal mass with gynecology #5. Routine GI follow-up 3 months. Contact the office in the interim for any questions or problems  25 minutes was spent face-to-face with the patient. Greater than 50% a time use for counseling regarding her microscopic colitis and diarrhea. Treatment plans as outlined as well as follow-up

## 2016-05-24 MED ORDER — CHOLESTYRAMINE 4 G PO PACK: 4.0000 g | PACK | Freq: Two times a day (BID) | ORAL | 11 refills | Status: DC

## 2016-05-24 MED ORDER — DIPHENOXYLATE-ATROPINE 2.5-0.025 MG PO TABS: 1.0000 | ORAL_TABLET | Freq: Four times a day (QID) | ORAL | 3 refills | Status: DC

## 2016-05-24 NOTE — Telephone Encounter (Signed)
Pt needs her med resent to pharmacy and she said she needs more than a 7day supply of her Lomotil

## 2016-05-24 NOTE — Telephone Encounter (Signed)
Resent Questran and Lomotil

## 2016-05-25 ENCOUNTER — Other Ambulatory Visit: Payer: Self-pay | Admitting: Obstetrics & Gynecology

## 2016-05-25 DIAGNOSIS — N949 Unspecified condition associated with female genital organs and menstrual cycle: Secondary | ICD-10-CM

## 2016-05-25 DIAGNOSIS — R971 Elevated cancer antigen 125 [CA 125]: Secondary | ICD-10-CM

## 2016-05-28 ENCOUNTER — Encounter: Payer: Self-pay | Admitting: Emergency Medicine

## 2016-05-28 ENCOUNTER — Ambulatory Visit (INDEPENDENT_AMBULATORY_CARE_PROVIDER_SITE_OTHER): Payer: Medicare Other | Admitting: Emergency Medicine

## 2016-05-28 ENCOUNTER — Encounter (INDEPENDENT_AMBULATORY_CARE_PROVIDER_SITE_OTHER): Payer: Self-pay

## 2016-05-28 DIAGNOSIS — J438 Other emphysema: Secondary | ICD-10-CM

## 2016-05-28 MED ORDER — BUDESONIDE-FORMOTEROL FUMARATE 160-4.5 MCG/ACT IN AERO: 2.0000 | INHALATION_SPRAY | Freq: Two times a day (BID) | RESPIRATORY_TRACT | 0 refills | Status: DC

## 2016-05-28 NOTE — Assessment & Plan Note (Signed)
We will stop Anoro. She appears to have difficulty with powdered formulation of this and also Spiriva. She may have tolerated and benefited from [place]. She may have had some numbness of her lips with this medication.. At this point I like to try her on a long-acting beta agonist to see if this helps her more. We will do a trial of this. Also we need to change her Xopenex to albuterol.

## 2016-05-28 NOTE — Addendum Note (Signed)
Addended by: Desmond Dike C on: 05/28/2016 12:07 PM   Modules accepted: Orders

## 2016-05-28 NOTE — Progress Notes (Signed)
  Subjective:    Patient ID: [name], female    DOB: 1933-08-24, 82 y.o.   MRN: [id number] HPI  ROV 05/28/16 -- Patient follows up today for her history of COPD, chronic rhinitis, chronic cough. Also with a history of psoriatic arthritis And former methotrexate use. At our last visit we decided to retry Anoro to see if she would benefit. She has noticed some increase in cough w the medication. She is not sure that it is helping her breathing very much.  She coughed with spiriva handihaler also. Tolerated spiriva respimat. Has been on xopenex in past, no longer paid for.     Objective:   Physical Exam Vitals:   05/28/16 1117  BP: 132/72  BP Location: Left Arm  Cuff Size: Normal  Pulse: 93  SpO2: 95%  Weight: 184 lb (83.5 kg)  Height: 5' 4"  (1.626 m)   Gen: Pleasant, overwt, in no distress,  normal affect  ENT: No lesions,  mouth clear,  oropharynx clear, no postnasal drip  Neck: No JVD, no TMG, no carotid bruits  Lungs: No use of accessory muscles, no wheezes  Cardiovascular: RRR, heart sounds normal, no murmur or gallops, no peripheral edema  Musculoskeletal: No deformities, no cyanosis or clubbing  Neuro: alert, non focal  Skin: Warm, no lesions or rashes   Assessment & Plan:  COPD (chronic obstructive pulmonary disease) We will stop Anoro. She appears to have difficulty with powdered formulation of this and also Spiriva. She may have tolerated and benefited from [place]. She may have had some numbness of her lips with this medication.. At this point I like to try her on a long-acting beta agonist to see if this helps her more. We will do a trial of this. Also we need to change her Xopenex to albuterol.   Baltazar Apo, MD, PhD 05/28/2016, 11:41 AM [facility] [phone] or if no answer [phone]

## 2016-05-28 NOTE — Patient Instructions (Addendum)
We will try Symbicort 2 puffs twice a day. Rinse and gargle after using.  Stop Anoro We will stop xopenex. Start albuterol 2 puffs up to every 4 hours if needed for shortness of breath.  Get the flu shot in the Fall.  Follow with Dr Lamonte Sakai in 2 months to assess your status on the new medication.

## 2016-05-30 ENCOUNTER — Ambulatory Visit
Admission: RE | Admit: 2016-05-30 | Discharge: 2016-05-30 | Disposition: A | Discharge status: Home / Self Care | Payer: Medicare Other | Source: Ambulatory Visit | Attending: Obstetrics & Gynecology | Admitting: Obstetrics & Gynecology

## 2016-05-30 DIAGNOSIS — N949 Unspecified condition associated with female genital organs and menstrual cycle: Secondary | ICD-10-CM

## 2016-05-30 DIAGNOSIS — R971 Elevated cancer antigen 125 [CA 125]: Secondary | ICD-10-CM

## 2016-05-30 MED ORDER — IOPAMIDOL (ISOVUE-300) INJECTION 61%
100.0000 mL | Freq: Once | INTRAVENOUS | Status: AC | PRN
  Administered 2016-05-30: 100 mL via INTRAVENOUS

## 2016-05-31 ENCOUNTER — Telehealth: Payer: Self-pay | Admitting: Cardiology

## 2016-05-31 NOTE — Telephone Encounter (Signed)
I have scheduled pt to see Dr Aundra Dubin 06/15/16 at 1:15PM, left message to call back for pt to let her know.

## 2016-05-31 NOTE — Telephone Encounter (Signed)
New message       Pt states that doctor told her that she needed to be seen in August and I offered December per the recall. Pt states that the dr told her if that happened again to contact [facility] to work her in. Please call.

## 2016-06-01 ENCOUNTER — Telehealth: Payer: Self-pay | Admitting: Emergency Medicine

## 2016-06-01 MED ORDER — ALBUTEROL SULFATE HFA 108 (90 BASE) MCG/ACT IN AERS: 2.0000 | INHALATION_SPRAY | Freq: Four times a day (QID) | RESPIRATORY_TRACT | 6 refills | Status: DC | PRN

## 2016-06-01 NOTE — Telephone Encounter (Signed)
Called and spoke with pt and she is aware of rx for the albuterol has been sent to her pharmacy.  Pt stated that this was changed at her last ov and was never called in for her.  This has been done and nothing further is needed.

## 2016-06-05 NOTE — Telephone Encounter (Signed)
Pt aware of appt time with Dr Aundra Dubin

## 2016-06-07 ENCOUNTER — Telehealth: Payer: Self-pay | Admitting: Internal Medicine

## 2016-06-07 MED ORDER — DIPHENOXYLATE-ATROPINE 2.5-0.025 MG PO TABS: 1.0000 | ORAL_TABLET | Freq: Four times a day (QID) | ORAL | 1 refills | Status: DC

## 2016-06-07 NOTE — Telephone Encounter (Signed)
Sent 90 day supply of Lomotil

## 2016-06-13 ENCOUNTER — Encounter: Payer: Self-pay | Admitting: Cardiology

## 2016-06-15 ENCOUNTER — Ambulatory Visit (INDEPENDENT_AMBULATORY_CARE_PROVIDER_SITE_OTHER): Payer: Medicare Other | Admitting: Cardiology

## 2016-06-15 ENCOUNTER — Encounter: Payer: Self-pay | Admitting: Cardiology

## 2016-06-15 ENCOUNTER — Encounter (INDEPENDENT_AMBULATORY_CARE_PROVIDER_SITE_OTHER): Payer: Self-pay

## 2016-06-15 VITALS — BP 122/64 | HR 84 | Ht 64.0 in | Wt 183.0 lb

## 2016-06-15 DIAGNOSIS — I480 Paroxysmal atrial fibrillation: Secondary | ICD-10-CM

## 2016-06-15 DIAGNOSIS — J438 Other emphysema: Secondary | ICD-10-CM | POA: Diagnosis not present

## 2016-06-15 LAB — CBC
HEMATOCRIT: 43.1 % (ref 35.0–45.0)
Hemoglobin: 14.2 g/dL (ref 11.7–15.5)
MCH: 29.5 pg (ref 27.0–33.0)
MCHC: 32.9 g/dL (ref 32.0–36.0)
MCV: 89.4 fL (ref 80.0–100.0)
MPV: 11.1 fL (ref 7.5–12.5)
Platelets: 255 10*3/uL (ref 140–400)
RBC: 4.82 MIL/uL (ref 3.80–5.10)
RDW: 13.9 % (ref 11.0–15.0)
WBC: 10.1 10*3/uL (ref 3.8–10.8)

## 2016-06-15 LAB — BASIC METABOLIC PANEL
BUN: 19 mg/dL (ref 7–25)
CO2: 25 mmol/L (ref 20–31)
CREATININE: 0.91 mg/dL — AB (ref 0.60–0.88)
Calcium: 9.2 mg/dL (ref 8.6–10.4)
Chloride: 98 mmol/L (ref 98–110)
Glucose, Bld: 81 mg/dL (ref 65–99)
Potassium: 4.1 mmol/L (ref 3.5–5.3)
Sodium: 134 mmol/L — ABNORMAL LOW (ref 135–146)

## 2016-06-15 NOTE — Patient Instructions (Signed)
Medication Instructions:  Your physician recommends that you continue on your current medications as directed. Please refer to the Current Medication list given to you today.   Labwork: Lab work to be done today--BMP, CBC  Testing/Procedures: none  Follow-Up: Your physician wants you to follow-up in: 6 months with Dr. Domenic Polite.  You will receive a reminder letter in the mail two months in advance. If you don't receive a letter, please call our office to schedule the follow-up appointment.   Any Other Special Instructions Will Be Listed Below (If Applicable).     If you need a refill on your cardiac medications before your next appointment, please call your pharmacy.

## 2016-06-16 NOTE — Progress Notes (Signed)
Patient ID: [name], female   DOB: [birth year], 82 y.o.   MRN: [id number] PCP: Dr. Willey Blade  82 yo with history of psoriatic arthritis, lymphocytic colitis, paroxysmal atrial fibrillation, and COPD returns for cardiology followup. She had a normal Lexiscan Myoview in 7/13 for atypical chest pain.   Since her last visit, she has not had tachypalpitations concerning for recurrent atrial fibrillation.  She is in NSR today.  No chest pain.  She is chronically short of breath.  She can walk about 1/2 block before stopping and gets short of breath with steps, this has not changed.  She follows with pulmonary for COPD.  She is taking Xarelto with no BRBPR or melena.   Labs (12/13): TnI negative x 2, TSH normal, LDL 59 Labs (7/14): K 3.5, creatinine 0.79 Labs (9/14): HCT 41.7 Labs (7/15): K 4.6, creatinine 0.86, hgb 11.3 Labs (2/16): K 4.7, creatinine 0.77, HCT 45 Labs (6/16): K 4.3, creatinine 0.88, LDL 114, hgb 15 Labs (11/16): BNP 51, creatinine 0.89 Labs (2/17): HCT 44.4 Labs (4/17): creatinine 0.9  ECG: NSR, rightward axis  PMH: 1. Lymphocytic colitis 2. Hypothyroidism 3. Psoriatic arthritis with h/o MTX use.  4. OA s/p TKR 5. COPD with h/o exacerbations.  Prior smoker.  6. GERD with hiatal hernia 7. CCY 8. Depression 9. Chest pain: Lexiscan myoview (12/13) with EF 75%, apical thinning but no ischemia or infarction.  10. Paroxysmal atrial fibrillation 11. Hip fracture 3/15 s/p repair 12. Peripheral arterial dopplers 3/15: Normal ABIs.  13. Low back pain 14. Echo (11/16) with EF 60-65%, mild LVH, normal RV size/systolic function.   SH: Former smoker, quit in 1982, lives alone.   FH: Father with PCM, brother with MI.   ROS: All systems reviewed and negative except as per HPI.    Current Outpatient Prescriptions  Medication Sig Dispense Refill  . acetaminophen (TYLENOL) 325 MG tablet Take 2 tablets (650 mg total) by mouth every 6 (six) hours as needed (or Fever >/= 101). 60  tablet 0  . albuterol (PROVENTIL HFA;VENTOLIN HFA) 108 (90 Base) MCG/ACT inhaler Inhale 2 puffs into the lungs every 6 (six) hours as needed for wheezing or shortness of breath. 1 Inhaler 6  . ALPRAZolam (XANAX) 0.5 MG tablet Take 0.5-1 tablets (0.25-0.5 mg total) by mouth at bedtime as needed. Sleep 30 tablet 0  . budesonide-formoterol (SYMBICORT) 160-4.5 MCG/ACT inhaler Inhale 2 puffs into the lungs 2 (two) times daily. 2 Inhaler 0  . CARTIA XT 120 MG 24 hr capsule TAKE 1 CAPSULE BY MOUTH EVERY DAY 90 capsule 3  . cholestyramine (QUESTRAN) 4 g packet Take 1 packet (4 g total) by mouth 2 (two) times daily. 60 packet 11  . clobetasol cream (TEMOVATE) 0.05 % as needed.     . diphenoxylate-atropine (LOMOTIL) 2.5-0.025 MG tablet Take 1 tablet by mouth 4 (four) times daily. 360 tablet 1  . fluticasone (FLONASE) 50 MCG/ACT nasal spray Place 2 sprays into both nostrils 2 (two) times daily. For nasal congestion    . gabapentin (NEURONTIN) 100 MG capsule Take 300 mg by mouth at bedtime.     Marland Kitchen levothyroxine (SYNTHROID, LEVOTHROID) 125 MCG tablet Take 125 mcg by mouth every morning. For thyroid therapy    . methylcellulose (ARTIFICIAL TEARS) 1 % ophthalmic solution Place 1 drop into both eyes 2 (two) times daily as needed. Dry eyes    . traMADol (ULTRAM) 50 MG tablet Take 50 mg by mouth every 6 (six) hours as needed (for pain).     Marland Kitchen  XARELTO 20 MG TABS tablet TAKE 1 TABLET BY MOUTH EVERY DAY WITH SUPPER 90 tablet 2   Current Facility-Administered Medications  Medication Dose Route Frequency Provider Last Rate Last Dose  . cholestyramine (QUESTRAN) packet 4 g  4 g Oral BID [name], MD        BP 122/64   Pulse 84   Ht 5' 4"  (1.626 m)   Wt 183 lb (83 kg)   BMI 31.41 kg/m  General: NAD Neck: JVP 7 cm, no thyromegaly or thyroid nodule.  Lungs: Prolonged respiratory phase CV: Nondisplaced PMI.  Heart regular S1/S2, no S3/S4, no murmur.  No edema.  No carotid bruit.  Unable to palpate pedal pulses.   Abdomen: Soft, nontender, no hepatosplenomegaly, no distention.   Neurologic: Alert and oriented x 3.  Psych: Normal affect. Extremities: No clubbing or cyanosis.   Assessment/Plan: 1. Atrial fibrillation: Paroxysmal atrial fibrillation, quite symptomatic when it occurs (ER visits).  She is in NSR today on diltiazem CD and Xarelto.  She is now off dronedarone due to expense but has had no recent tachypalpitations.  - If she has symptomatic recurrence of atrial fibrillation, flecainide would be an option for rhythm control but would need ischemic workup.  - CBC/BMET today 2. COPD: Chronic dyspnea is likely due primarily to COPD.  Echo in 11/16 was unremarkable. 3. Chronic diastolic CHF: No volume overload on exam.  She no longer takes Lasix.    Followup in 6 months.  Given my transition to CHF clinic, she will followup in [place] with Dr Domenic Polite.   [name] 06/16/2016

## 2016-06-17 ENCOUNTER — Other Ambulatory Visit: Payer: Self-pay | Admitting: Emergency Medicine

## 2016-06-18 ENCOUNTER — Telehealth: Payer: Self-pay | Admitting: Cardiology

## 2016-06-18 NOTE — Telephone Encounter (Signed)
Samples of Xarelto 45m / tg

## 2016-06-19 NOTE — Telephone Encounter (Signed)
3 bottles xarelto 20 mg lot 16MG 053  Exp 9/19 given

## 2016-06-22 ENCOUNTER — Other Ambulatory Visit (HOSPITAL_COMMUNITY): Payer: Self-pay | Admitting: Internal Medicine

## 2016-06-22 DIAGNOSIS — Z1231 Encounter for screening mammogram for malignant neoplasm of breast: Secondary | ICD-10-CM

## 2016-06-25 ENCOUNTER — Ambulatory Visit (HOSPITAL_COMMUNITY)
Admission: RE | Admit: 2016-06-25 | Discharge: 2016-06-25 | Disposition: A | Discharge status: Home / Self Care | Payer: Medicare Other | Source: Ambulatory Visit | Attending: Internal Medicine | Admitting: Internal Medicine

## 2016-06-25 DIAGNOSIS — Z1231 Encounter for screening mammogram for malignant neoplasm of breast: Secondary | ICD-10-CM | POA: Diagnosis present

## 2016-06-29 ENCOUNTER — Telehealth: Payer: Self-pay | Admitting: Emergency Medicine

## 2016-06-29 NOTE — Telephone Encounter (Signed)
Spoke with pt and she c/o increased dry cough and runny nose since yesterday. Pt denies wheeze/ShOB/chest tightness or f/n/v. Pt thinks she may have bronchitis.  RB is out of office. TP - Please advise. Thanks!

## 2016-06-29 NOTE — Telephone Encounter (Signed)
Spoke with pt,aware of recs.  Nothing further needed.  

## 2016-06-29 NOTE — Telephone Encounter (Signed)
Please try delsym 2 tsp Twice daily  As needed  Cough  Saline nasal rinses for nose.As needed   May try claritin 25m daily for drainage As needed   Please contact office for sooner follow up if symptoms do not improve or worsen or seek emergency care

## 2016-07-05 ENCOUNTER — Telehealth: Payer: Self-pay | Admitting: Emergency Medicine

## 2016-07-05 MED ORDER — BUDESONIDE-FORMOTEROL FUMARATE 160-4.5 MCG/ACT IN AERO: 2.0000 | INHALATION_SPRAY | Freq: Two times a day (BID) | RESPIRATORY_TRACT | 3 refills | Status: DC

## 2016-07-05 NOTE — Telephone Encounter (Signed)
Called spoke with pt. Aware no symbicort samples. Nothing further needed

## 2016-07-06 ENCOUNTER — Telehealth: Payer: Self-pay | Admitting: Emergency Medicine

## 2016-07-06 MED ORDER — FLUTICASONE-SALMETEROL 45-21 MCG/ACT IN AERO: 2.0000 | INHALATION_SPRAY | Freq: Two times a day (BID) | RESPIRATORY_TRACT | 3 refills | Status: DC

## 2016-07-06 NOTE — Telephone Encounter (Signed)
[employer]. Symbicort 160 mg is non formulary. Alternatives are spiriva, combivent resp, breo, incruse, anoro, advair diskus/HFA, servent diskus, striverdi.  Please advise RB thanks

## 2016-07-06 NOTE — Telephone Encounter (Signed)
Pt returned call Discussed RB recommendations to change Symbicort to Advair 45/21.  Pt okay with this and voiced her understanding.  She feels confident she will know how to use this as the directions are the same as Symbicort.  Rx sent to verified pharmacy Symbicort removed from med list Nothing further needed; will sign off

## 2016-07-06 NOTE — Telephone Encounter (Signed)
LMOMTCB x 1 

## 2016-07-06 NOTE — Telephone Encounter (Signed)
We are trying to avoid a powdered inhaler - best choice would be the Advair HFA 45/38mg, 2 puffs twice a day

## 2016-07-24 ENCOUNTER — Telehealth: Payer: Self-pay | Admitting: Emergency Medicine

## 2016-07-24 MED ORDER — MOMETASONE FURO-FORMOTEROL FUM 200-5 MCG/ACT IN AERO: 2.0000 | INHALATION_SPRAY | Freq: Two times a day (BID) | RESPIRATORY_TRACT | 0 refills | Status: DC

## 2016-07-24 NOTE — Telephone Encounter (Signed)
dulera 200

## 2016-07-24 NOTE — Telephone Encounter (Signed)
I agree she needs to be changed. Depending on her formulary I believe we need to either change to [facility] (if it is on formulary) or we should do a PA for Symbicort deeming Advair a treatment failure.

## 2016-07-24 NOTE — Telephone Encounter (Signed)
Spoke with pt, aware of recs.  Sample placed up front for patient.  She will pick this up on Thursday.  Nothing further needed.

## 2016-07-24 NOTE — Telephone Encounter (Signed)
Spoke with pt. States Advair is making her feel bad. At her last OV, RB started her on Symbicort but her insurance would not cover this and we had to change her to Advair. Reports increased SOB and coughing. Cough is producing clear mucus. States that Symbicort made a real difference in her breathing and wants to go back on this.  RB - please advise. Thanks.

## 2016-07-24 NOTE — Telephone Encounter (Signed)
RB please advise what strength of the dulera would you want the pt to be on and directions.  thanks

## 2016-08-06 ENCOUNTER — Ambulatory Visit (INDEPENDENT_AMBULATORY_CARE_PROVIDER_SITE_OTHER): Payer: Medicare Other | Admitting: Emergency Medicine

## 2016-08-06 ENCOUNTER — Encounter: Payer: Self-pay | Admitting: Emergency Medicine

## 2016-08-06 DIAGNOSIS — J301 Allergic rhinitis due to pollen: Secondary | ICD-10-CM | POA: Diagnosis not present

## 2016-08-06 DIAGNOSIS — J438 Other emphysema: Secondary | ICD-10-CM

## 2016-08-06 MED ORDER — IPRATROPIUM BROMIDE 0.03 % NA SOLN: 2.0000 | Freq: Two times a day (BID) | NASAL | 5 refills | Status: DC

## 2016-08-06 NOTE — Progress Notes (Signed)
  Subjective:    Patient ID: [name], female    DOB: 09/16/1933, 82 y.o.   MRN: [id number] HPI  ROV 05/28/16 -- Patient follows up today for her history of COPD, chronic rhinitis, chronic cough. Also with a history of psoriatic arthritis And former methotrexate use. At our last visit we decided to retry Anoro to see if she would benefit. She has noticed some increase in cough w the medication. She is not sure that it is helping her breathing very much.  She coughed with spiriva handihaler also. Tolerated spiriva respimat. Has been on xopenex in past, no longer paid for.   ROV 08/06/16 -- This follow-up visit for patient with a history of COPD, chronic rhinitis, chronic cough and psoriatic arthritis. She has been on methotrexate in the past but not currently. Has tried multiple bronchodilators but has had side effects particularly side effect of cough. We stopped Advair, started symbicort that was then substituted with Dulera. She feels that the sym bicort worked better for her - opened her up better. Throat irritation is better. She is still having chronic rhinitis, more that normal, more cough than normal. She is on flonase qd and astelin.  She uses albuterol less than before. Helps her breathing.    Objective:   Physical Exam Vitals:   08/06/16 1138  BP: 110/60  BP Location: Left Arm  Cuff Size: Normal  Pulse: 72  SpO2: 96%  Weight: 182 lb (82.6 kg)  Height: 5' 4"  (1.626 m)   Gen: Pleasant, overwt, in no distress,  normal affect  ENT: No lesions,  mouth clear,  oropharynx clear, no postnasal drip  Neck: No JVD, no TMG, no carotid bruits  Lungs: No use of accessory muscles, no wheezes  Cardiovascular: RRR, heart sounds normal, no murmur or gallops, no peripheral edema  Musculoskeletal: No deformities, no cyanosis or clubbing  Neuro: alert, non focal  Skin: Warm, no lesions or rashes   Assessment & Plan:  COPD (chronic obstructive pulmonary disease)  Continue Dulera  twice a day  Keep albuterol available to use as needed for shortness of breath.  Follow with Dr Lamonte Sakai in 3 months or sooner if you have any problems  Allergic rhinitis, seasonal Increase your flonase to 2 sprays each nostril twice a day  Stop astelin for now Start using ipratropium (Atrovent) 2 sprays each nostril up to 2 times a day if needed for nasal drainage and congestion.   Baltazar Apo, MD, PhD 08/06/2016, 12:00 PM [facility] [phone] or if no answer [phone]

## 2016-08-06 NOTE — Assessment & Plan Note (Signed)
  Continue Dulera twice a day  Keep albuterol available to use as needed for shortness of breath.  Follow with Dr Lamonte Sakai in 3 months or sooner if you have any problems

## 2016-08-06 NOTE — Addendum Note (Signed)
Addended by: Desmond Dike C on: 08/06/2016 12:04 PM   Modules accepted: Orders

## 2016-08-06 NOTE — Patient Instructions (Addendum)
Increase your flonase to 2 sprays each nostril twice a day  Stop astelin for now Start using ipratropium (Atrovent) 2 sprays each nostril up to 2 times a day if needed for nasal drainage and congestion.  Continue Dulera twice a day  Keep albuterol available to use as needed for shortness of breath.  Follow with Dr Lamonte Sakai in 3 months or sooner if you have any problems.

## 2016-08-06 NOTE — Assessment & Plan Note (Signed)
Increase your flonase to 2 sprays each nostril twice a day  Stop astelin for now Start using ipratropium (Atrovent) 2 sprays each nostril up to 2 times a day if needed for nasal drainage and congestion.

## 2016-08-13 ENCOUNTER — Telehealth: Payer: Self-pay | Admitting: Emergency Medicine

## 2016-08-13 MED ORDER — MOMETASONE FURO-FORMOTEROL FUM 200-5 MCG/ACT IN AERO: 2.0000 | INHALATION_SPRAY | Freq: Two times a day (BID) | RESPIRATORY_TRACT | 5 refills | Status: DC

## 2016-08-13 NOTE — Telephone Encounter (Signed)
Left message for patient stating Rx request for Ruthe Mannan has been sent to [facility] [place]. Nothing more needed at this time.

## 2016-08-15 ENCOUNTER — Telehealth: Payer: Self-pay | Admitting: Emergency Medicine

## 2016-08-15 NOTE — Telephone Encounter (Signed)
[facility] and spoke with a Occupational psychologist. PA needs to be called at [phone]. Pt's ID: [id number] A. Called [employer] and spoke with Joneen Caraway. PA has been initiated. PA has been approved through 10/07/16.  Pt is aware of this information. [facility] is also aware of this information. Nothing further was needed.

## 2016-08-17 DIAGNOSIS — R971 Elevated cancer antigen 125 [CA 125]: Secondary | ICD-10-CM

## 2016-08-17 DIAGNOSIS — N83201 Unspecified ovarian cyst, right side: Secondary | ICD-10-CM

## 2016-08-21 ENCOUNTER — Encounter: Payer: Self-pay | Admitting: Internal Medicine

## 2016-08-21 ENCOUNTER — Ambulatory Visit (INDEPENDENT_AMBULATORY_CARE_PROVIDER_SITE_OTHER): Payer: Medicare Other | Admitting: Internal Medicine

## 2016-08-21 VITALS — BP 110/62 | HR 80 | Ht 63.75 in | Wt 183.0 lb

## 2016-08-21 DIAGNOSIS — R159 Full incontinence of feces: Secondary | ICD-10-CM | POA: Diagnosis not present

## 2016-08-21 DIAGNOSIS — K52838 Other microscopic colitis: Secondary | ICD-10-CM

## 2016-08-21 DIAGNOSIS — R197 Diarrhea, unspecified: Secondary | ICD-10-CM

## 2016-08-21 MED ORDER — DIPHENOXYLATE-ATROPINE 2.5-0.025 MG PO TABS: 1.0000 | ORAL_TABLET | Freq: Four times a day (QID) | ORAL | 1 refills | Status: DC

## 2016-08-21 MED ORDER — CHOLESTYRAMINE 4 G PO PACK: 4.0000 g | PACK | Freq: Two times a day (BID) | ORAL | 11 refills | Status: DC

## 2016-08-21 NOTE — Progress Notes (Signed)
HISTORY OF PRESENT ILLNESS:  [name] is a 82 y.o. female with multiple medical problems as listed below who is followed in this office for GERD and symptomatic microscopic colitis. She could not afford budesonide and therefore did not start the medication. She was intolerant to low-dose prednisone. Lomotil 4 times daily improved symptoms but unsatisfactorily. Last evaluated 05/23/2016. Questran 4 g twice daily started as directed. Follow-up at this time arranged. She did undergo contrast-enhanced CT scan of the pelvis and abdomen 05/30/2016 to evaluate right adnexal lesion. No significant abnormalities found. Stable 4 cm benign-appearing right adnexal cystic lesion compared to studies eating back 2005. Managed by her GYN. Patient reports significant improvement in her bowel habits. She continues to take Lomotil 4 times daily and has been compliant with Questran therapy for the most part. Currently describes 2 bowel movements per day which are not formed. However issues with urgency and incontinence have improved significantly. We were able to obtain limited numbers of budesonide (Uceris) samples for her to try. No new complaints  REVIEW OF SYSTEMS:  All non-GI ROS negative except for arthritis, back pain, hearing problems, skin rash, shortness of breath  Past Medical History:  Diagnosis Date  . A-fib ([place])    with flutter  . Arthritis    psoriatic  . COPD (chronic obstructive pulmonary disease) ([place])   . Depression   . Diverticulosis   . Family hx of colon cancer   . Fibromyalgia   . GERD (gastroesophageal reflux disease)   . Hemorrhoids   . Hiatal hernia   . History of shingles   . Hypothyroidism   . IBS (irritable bowel syndrome)   . Lymphocytic colitis   . Osteoporosis    hx -osteopenia  . Psoriasis   . Psoriatic arthritis ([place])   . Rectal polyp   . Schatzki's ring   . Uterine polyp     Past Surgical History:  Procedure Laterality Date  . CHOLECYSTECTOMY  Dec 04, 2010   . FEMUR IM NAIL Left 12/11/2013   Procedure: INTRAMEDULLARY (IM) NAIL FEMORAL;  Surgeon: [name], MD;  Location: [facility];  Service: Orthopedics;  Laterality: Left;  . FRACTURE SURGERY Left 12/11/2013   hip  . KNEE SURGERY Left    x 2  . TOTAL KNEE REVISION  08/06/2012   Procedure: TOTAL KNEE REVISION;  Surgeon: [name], MD;  Location: [facility];  Service: Orthopedics;  Laterality: Left;  Left Total Knee Arthroplasty Revision  . uterine polypectomy      Social History [name]  reports that she quit smoking about 35 years ago. Her smoking use included Cigarettes. She has a 20.00 pack-year smoking history. She has never used smokeless tobacco. She reports that she drinks alcohol. She reports that she does not use drugs.  family history includes Colon cancer in her maternal grandfather and mother; Depression in her sister; Heart disease in her father; Lung cancer in her father.  Allergies  Allergen Reactions  . Codeine Itching  . Cymbalta [Duloxetine Hcl] Itching       PHYSICAL EXAMINATION: Vital signs: BP 110/62   Pulse 80   Ht 5' 3.75" (1.619 m)   Wt 183 lb (83 kg)   BMI 31.66 kg/m  General: Well-developed, well-nourished, no acute distress HEENT: Sclerae are anicteric, conjunctiva pink. Oral mucosa intact Lungs: Clear Heart: Regular Abdomen: soft, nontender, nondistended, no obvious ascites, no peritoneal signs, normal bowel sounds. No organomegaly. Extremities: No cyanosis or edema Psychiatric: alert and oriented  x3. Cooperative  ASSESSMENT:  #1. Microscopic colitis. Significant but Incomplete improvement with symptomatic therapies  PLAN:  #1. Refill Lomotil #2. Refill Questran #3. 18 days of budesonide 9 mg samples provided for her to try. Take 1 daily #4. Office follow-up 3 months  15 minutes spent face-to-face with the patient. The overwhelming majority of the time use for counseling regarding management of her microscopic colitis, discussing  short-term trial of budesonide, and reviewing CT scan findings per her request

## 2016-08-21 NOTE — Patient Instructions (Signed)
We have sent the following medications to your pharmacy for you to pick up at your convenience:  Lomotil, Questran  You have been given some samples of Uceris to try.  Take one a day.\  Please follow up in 3 months

## 2016-09-26 ENCOUNTER — Telehealth: Payer: Self-pay | Admitting: Cardiology

## 2016-09-26 NOTE — Telephone Encounter (Signed)
Pt is needing to have Cataract surgery and is wanting to know what to do about her blood thinner.   She'd also like some samples of Xarelto 25m, she's in the doughnut [place].

## 2016-09-27 NOTE — Telephone Encounter (Signed)
Called pt., no answer. Left message for pt to return call.  

## 2016-09-28 NOTE — Telephone Encounter (Signed)
Pt returned phone call. Will give xarelto smaples, asked pt to have eye dr. Joylene Igo [place] a clearance for dr to sign.

## 2016-10-04 ENCOUNTER — Telehealth: Payer: Self-pay | Admitting: Cardiology

## 2016-10-04 ENCOUNTER — Telehealth: Payer: Self-pay | Admitting: Emergency Medicine

## 2016-10-04 NOTE — Telephone Encounter (Signed)
Request for surgical clearance:  1. What type of surgery is being performed? Cataract  2. When is this surgery scheduled? 10-16-16   3. Are there any medications that need to be held prior to surgery and how long?Can pt hold her Xarelto 3 days prior to surgery?   4. Name of physician performing surgery? Dr Shon Hough  5. What is your office phone and fax number? [phone] and fax number is [phone]

## 2016-10-04 NOTE — Telephone Encounter (Signed)
Pt is scheduled for cataract surgery on 10/16/16.  Is pt ok to stop Xarelto 3 days prior to surgery?  Please advise

## 2016-10-04 NOTE — Telephone Encounter (Signed)
Fax sent to Dr. Elder Negus office.

## 2016-10-04 NOTE — Telephone Encounter (Signed)
May hold Xarelto 3 days prior to surgery.

## 2016-10-05 NOTE — Telephone Encounter (Signed)
RB please advise. thanks

## 2016-10-08 HISTORY — PX: CATARACT EXTRACTION W/ INTRAOCULAR LENS  IMPLANT, BILATERAL: SHX1307

## 2016-10-10 NOTE — Telephone Encounter (Signed)
Yes that is OK, then restart 2-3 days after as long as ok w the surgeon

## 2016-10-10 NOTE — Telephone Encounter (Signed)
Spoke with Angela Nevin at [facility]. States that she received the info she needed from Dr. Aundra Dubin. Nothing further was needed.

## 2016-10-15 ENCOUNTER — Telehealth: Payer: Self-pay | Admitting: Emergency Medicine

## 2016-10-15 NOTE — Telephone Encounter (Signed)
Attempted to call pt. Line rang busy x2. Will try back.

## 2016-10-16 MED ORDER — BUDESONIDE-FORMOTEROL FUMARATE 160-4.5 MCG/ACT IN AERO: 2.0000 | INHALATION_SPRAY | Freq: Two times a day (BID) | RESPIRATORY_TRACT | 3 refills | Status: DC

## 2016-10-16 NOTE — Telephone Encounter (Signed)
Spoke with pt. She is wanting to have Dulera switched back to Symbicort. Her new insurance will now cover Symbicort.  RB - please advise. Thanks.

## 2016-10-16 NOTE — Telephone Encounter (Signed)
This change is OK with me. 160/4.49mg, 2 puffs twice a day

## 2016-10-16 NOTE — Telephone Encounter (Signed)
Rx sent to preferred pharmacy. Pt aware and voiced her understanding. Nothing further needed.  

## 2016-10-19 ENCOUNTER — Ambulatory Visit: Payer: Self-pay | Admitting: Physician Assistant

## 2016-11-06 ENCOUNTER — Ambulatory Visit: Payer: Self-pay | Admitting: Emergency Medicine

## 2016-11-19 ENCOUNTER — Encounter (HOSPITAL_COMMUNITY): Payer: Self-pay

## 2016-11-19 ENCOUNTER — Encounter (HOSPITAL_COMMUNITY)
Admission: RE | Admit: 2016-11-19 | Discharge: 2016-11-19 | Disposition: A | Discharge status: Home / Self Care | Payer: Medicare Other | Source: Ambulatory Visit | Attending: Internal Medicine | Admitting: Internal Medicine

## 2016-11-19 DIAGNOSIS — M810 Age-related osteoporosis without current pathological fracture: Secondary | ICD-10-CM | POA: Diagnosis present

## 2016-11-19 MED ORDER — DENOSUMAB 60 MG/ML ~~LOC~~ SOLN
60.0000 mg | Freq: Once | SUBCUTANEOUS | Status: AC
  Administered 2016-11-19: 60 mg via SUBCUTANEOUS
  Filled 2016-11-19: qty 1

## 2016-11-22 ENCOUNTER — Encounter: Payer: Self-pay | Admitting: Emergency Medicine

## 2016-11-22 ENCOUNTER — Ambulatory Visit (INDEPENDENT_AMBULATORY_CARE_PROVIDER_SITE_OTHER): Payer: Medicare Other | Admitting: Emergency Medicine

## 2016-11-22 ENCOUNTER — Ambulatory Visit (INDEPENDENT_AMBULATORY_CARE_PROVIDER_SITE_OTHER)
Admission: RE | Admit: 2016-11-22 | Discharge: 2016-11-22 | Disposition: A | Discharge status: Home / Self Care | Payer: Medicare Other | Source: Ambulatory Visit | Attending: Emergency Medicine | Admitting: Emergency Medicine

## 2016-11-22 DIAGNOSIS — J438 Other emphysema: Secondary | ICD-10-CM | POA: Diagnosis not present

## 2016-11-22 MED ORDER — PREDNISONE 10 MG PO TABS: ORAL_TABLET | ORAL | 0 refills | Status: DC

## 2016-11-22 NOTE — Progress Notes (Signed)
  Subjective:    Patient ID: [name], female    DOB: [birth year], 83 y.o.   MRN: [id number] HPI ROV 05/28/16 -- Patient follows up today for her history of COPD, chronic rhinitis, chronic cough. Also with a history of psoriatic arthritis And former methotrexate use. At our last visit we decided to retry Anoro to see if she would benefit. She has noticed some increase in cough w the medication. She is not sure that it is helping her breathing very much.  She coughed with spiriva handihaler also. Tolerated spiriva respimat. Has been on xopenex in past, no longer paid for.   ROV 08/06/16 -- This follow-up visit for patient with a history of COPD, chronic rhinitis, chronic cough and psoriatic arthritis. She has been on methotrexate in the past but not currently. Has tried multiple bronchodilators but has had side effects particularly side effect of cough. We stopped Advair, started symbicort that was then substituted with Dulera. She feels that the sym bicort worked better for her - opened her up better. Throat irritation is better. She is still having chronic rhinitis, more that normal, more cough than normal. She is on flonase qd and astelin.  She uses albuterol less than before. Helps her breathing.  ROV 11/22/16 -- This is a follow-up visit for patient with a history of COPD psoriatic arthritis on methotrexate in the past (not currently), chronic rhinitis and chronic cough. She reports that she has been dealing with a URI since about 9 days ago. She was treated with empiric doxy, also w tamiflu. Still coughing up clear to white. She has more wheeze. She had to stop symbicort, restarted it in January when her insurance changed. Remains on flonase and atrovent Ns bid she uses albuterol about once a day (more than usual).     Objective:   Physical Exam Vitals:   11/22/16 1053  BP: 120/64  BP Location: Left Arm  Cuff Size: Normal  Pulse: 89  SpO2: 96%  Weight: 181 lb 3.2 oz (82.2 kg)  Height:  5' 6"  (1.676 m)   Gen: Pleasant, overwt, in no distress,  normal affect  ENT: No lesions,  mouth clear,  oropharynx clear, no postnasal drip  Neck: No JVD, no TMG, no carotid bruits  Lungs: No use of accessory muscles, no wheezes  Cardiovascular: RRR, heart sounds normal, no murmur or gallops, no peripheral edema  Musculoskeletal: No deformities, no cyanosis or clubbing  Neuro: alert, non focal  Skin: Warm, no lesions or rashes   Assessment & Plan:  COPD (chronic obstructive pulmonary disease) With a mild exacerbation in setting recent URI. She has been treated with doxy and no indication of bronchitis currently but she does have some wheeze, harsh cough.   CXR today Please continue your Symbicort 2 puffs twice a day Take prednisone as directed until completely gone.  Finish Tamiflu as planned.   Follow with Dr Lamonte Sakai in 4 months or sooner if you have any problems.  Allergic rhinitis, seasonal Continue your flonase twice a day Continue your atrovent nasal spray, 2 sprays each nostril 2-3x a day.  Baltazar Apo, MD, PhD 11/22/2016, 11:18 AM [facility] [phone] or if no answer [phone]

## 2016-11-22 NOTE — Assessment & Plan Note (Signed)
With a mild exacerbation in setting recent URI. She has been treated with doxy and no indication of bronchitis currently but she does have some wheeze, harsh cough.   CXR today Please continue your Symbicort 2 puffs twice a day Take prednisone as directed until completely gone.  Finish Tamiflu as planned.   Follow with Dr Lamonte Sakai in 4 months or sooner if you have any problems.

## 2016-11-22 NOTE — Assessment & Plan Note (Signed)
Continue your flonase twice a day Continue your atrovent nasal spray, 2 sprays each nostril 2-3x a day.

## 2016-11-22 NOTE — Patient Instructions (Signed)
CXR today Please continue your Symbicort 2 puffs twice a day Take prednisone as directed until completely gone.  Finish Tamiflu as planned.  Continue your flonase twice a day Continue your atrovent nasal spray, 2 sprays each nostril 2-3x a day.  Follow with Dr Lamonte Sakai in 4 months or sooner if you have any problems.

## 2016-11-29 ENCOUNTER — Ambulatory Visit: Admit: 2016-11-29 | Payer: Medicare Other | Admitting: Orthopedic Surgery

## 2016-11-29 SURGERY — INSERTION, SPINAL CORD STIMULATOR, LUMBAR
Anesthesia: General

## 2016-12-12 ENCOUNTER — Encounter: Payer: Self-pay | Admitting: Cardiology

## 2016-12-12 NOTE — Progress Notes (Signed)
Cardiology Office Note  Date: 12/13/2016   ID: [name], DOB Oct 02, 1933, MRN [id number]  PCP: Asencion Noble, MD  Evaluating Cardiologist: Rozann Lesches, MD   Chief Complaint  Patient presents with  . PAF    History of Present Illness: [name] is an 83 y.o. female former patient of Dr. Aundra Dubin, last seen in the office back in September 2017. This is our first meeting today, she is establishing follow-up in the August clinic. I reviewed her history and updated the chart. She is here today with her daughter.  Prior cardiac workup includes low risk Lexiscan Myoview in December 2013 revealing no ischemia with LVEF 75%. She underwent a more recent echocardiogram in 2016 that revealed LVEF 60-65% with mild diastolic dysfunction.  She does not report any significant palpitations, has not actually felt any for a few years now. She is on Xarelto for stroke prophylaxis and also Cartia XT 120 mg daily. Last blood work was in September 2017.  She does tell me that her stools recently looked a little darker than normal. She has had no hematochezia.  Past Medical History:  Diagnosis Date  . COPD (chronic obstructive pulmonary disease) ([place])   . Depression   . Diverticulosis   . Family hx of colon cancer   . Fibromyalgia   . GERD (gastroesophageal reflux disease)   . Hemorrhoids   . Hiatal hernia   . History of shingles   . Hypothyroidism   . IBS (irritable bowel syndrome)   . Lymphocytic colitis   . Osteoporosis   . Paroxysmal atrial fibrillation (H[facility])   . Psoriatic arthritis ([place])   . Rectal polyp   . Schatzki's ring   . Uterine polyp     Past Surgical History:  Procedure Laterality Date  . CHOLECYSTECTOMY  Dec 04, 2010  . FEMUR IM NAIL Left 12/11/2013   Procedure: INTRAMEDULLARY (IM) NAIL FEMORAL;  Surgeon: [name], MD;  Location: [facility];  Service: Orthopedics;  Laterality: Left;  . FRACTURE SURGERY Left 12/11/2013   hip  . KNEE SURGERY Left    x 2    . TOTAL KNEE REVISION  08/06/2012   Procedure: TOTAL KNEE REVISION;  Surgeon: [name], MD;  Location: [facility];  Service: Orthopedics;  Laterality: Left;  Left Total Knee Arthroplasty Revision  . Uterine polypectomy      Current Outpatient Prescriptions  Medication Sig Dispense Refill  . acetaminophen (TYLENOL) 325 MG tablet Take 2 tablets (650 mg total) by mouth every 6 (six) hours as needed (or Fever >/= 101). 60 tablet 0  . albuterol (PROVENTIL HFA;VENTOLIN HFA) 108 (90 Base) MCG/ACT inhaler Inhale 2 puffs into the lungs every 6 (six) hours as needed for wheezing or shortness of breath. 1 Inhaler 6  . ALPRAZolam (XANAX) 0.5 MG tablet Take 0.5-1 tablets (0.25-0.5 mg total) by mouth at bedtime as needed. Sleep 30 tablet 0  . CARTIA XT 120 MG 24 hr capsule TAKE 1 CAPSULE BY MOUTH EVERY DAY 90 capsule 3  . cholestyramine (QUESTRAN) 4 g packet Take 1 packet (4 g total) by mouth 2 (two) times daily. 60 packet 11  . clobetasol cream (TEMOVATE) 0.05 % as needed.     . diphenoxylate-atropine (LOMOTIL) 2.5-0.025 MG tablet Take 1 tablet by mouth 4 (four) times daily. 360 tablet 1  . fluticasone (FLONASE) 50 MCG/ACT nasal spray Place 2 sprays into both nostrils 2 (two) times daily. For nasal congestion    . ipratropium (ATROVENT) 0.03 %  nasal spray Place 2 sprays into both nostrils 2 (two) times daily. 30 mL 5  . levothyroxine (SYNTHROID, LEVOTHROID) 125 MCG tablet Take 125 mcg by mouth every morning. For thyroid therapy    . methylcellulose (ARTIFICIAL TEARS) 1 % ophthalmic solution Place 1 drop into both eyes 2 (two) times daily as needed. Dry eyes    . predniSONE (DELTASONE) 10 MG tablet 4 tabs X 3 days, 3 tabs X 3 days, 2 tabs X 3 days, 1 tabs, 3 days then stop 30 tablet 0  . pregabalin (LYRICA) 50 MG capsule Take 50 mg by mouth at bedtime.    . traMADol (ULTRAM) 50 MG tablet Take 50 mg by mouth every 6 (six) hours as needed (for pain).     Alveda Reasons 20 MG TABS tablet TAKE 1 TABLET BY MOUTH  EVERY DAY WITH SUPPER 90 tablet 2  . budesonide-formoterol (SYMBICORT) 160-4.5 MCG/ACT inhaler Inhale 2 puffs into the lungs 2 (two) times daily. 3 Inhaler 3   Current Facility-Administered Medications  Medication Dose Route Frequency Provider Last Rate Last Dose  . cholestyramine (QUESTRAN) packet 4 g  4 g Oral BID [name], MD       Allergies:  Codeine and Cymbalta [duloxetine hcl]   Social History: The patient  reports that she quit smoking about 36 years ago. Her smoking use included Cigarettes. She has a 20.00 pack-year smoking history. She has never used smokeless tobacco. She reports that she drinks alcohol. She reports that she does not use drugs.   ROS:  Please see the history of present illness. Otherwise, complete review of systems is positive for recent back injection for pain control - Xarelto held for procedure.  All other systems are reviewed and negative.   Physical Exam: VS:  BP 138/74   Pulse (!) 101   Ht 5' 4"  (1.626 m)   Wt 178 lb (80.7 kg)   SpO2 99%   BMI 30.55 kg/m , BMI Body mass index is 30.55 kg/m.  Wt Readings from Last 3 Encounters:  12/13/16 178 lb (80.7 kg)  11/22/16 181 lb 3.2 oz (82.2 kg)  08/21/16 183 lb (83 kg)    General: Elderly woman, appears comfortable at rest. HEENT: Conjunctiva and lids normal, oropharynx clear. Neck: Supple, no elevated JVP or carotid bruits, no thyromegaly. Lungs: Clear to auscultation, nonlabored breathing at rest. Cardiac: Regular rate and rhythm, no S3 or significant systolic murmur, no pericardial rub. Abdomen: Soft, nontender, bowel sounds present, no guarding or rebound. Extremities: No pitting edema, distal pulses 2+. Skin: Warm and dry. Musculoskeletal: No kyphosis. Neuropsychiatric: Alert and oriented x3, affect grossly appropriate.  ECG: I personally reviewed the tracing from 12/02/2015 which showed normal sinus rhythm with low voltage.  Recent Labwork: 06/15/2016: BUN 19; Creat 0.91; Hemoglobin 14.2;  Platelets 255; Potassium 4.1; Sodium 134   Other Studies Reviewed Today:  Echocardiogram 09/05/2015: Study Conclusions  - Left ventricle: The cavity size was normal. Wall thickness was   increased in a pattern of mild LVH. Systolic function was normal.   The estimated ejection fraction was in the range of 60% to 65%.   Wall motion was normal; there were no regional wall motion   abnormalities. Doppler parameters are consistent with abnormal   left ventricular relaxation (grade 1 diastolic dysfunction). The   E/e&' ratio is between 8-15, suggesting indeterminate LV filling   pressure. - Left atrium: The atrium was normal in size. - Tricuspid valve: There was trivial regurgitation. - Pulmonary arteries:  PA peak pressure: 23 mm Hg (S). - Inferior vena cava: The vessel was normal in size. The   respirophasic diameter changes were in the normal range (>= 50%),   consistent with normal central venous pressure.  Impressions:  - Compared to a prior study in 2014, there are few changes. EF is   60-45%, diastolic dysfunction and normal biatrial size.  Assessment and Plan:  1. Paroxysmal atrial fibrillation, no obvious recurrences on current regimen in the last few years. Plan to continue Cardia XT and Xarelto. Check CBC and BMET. If hemoglobin drifting down may need to have GI evaluation.  2. History of COPD, recent episode of bronchitis. She follows with Dr. Willey Blade.  3. History of diastolic heart failure although not requiring long-term diuretics. LVEF 60-65% by last echocardiogram. Continue observation for now.  Current medicines were reviewed with the patient today.   Orders Placed This Encounter  Procedures  . CBC  . Basic Metabolic Panel (BMET)    Disposition: Follow-up in 6 months.  Signed, Satira Sark, MD, [facility] 12/13/2016 11:30 AM    [place] [facility] [place]. [address] [place], [place] [address] Phone: [phone]; Fax: [phone]

## 2016-12-13 ENCOUNTER — Encounter: Payer: Self-pay | Admitting: Cardiology

## 2016-12-13 ENCOUNTER — Ambulatory Visit (INDEPENDENT_AMBULATORY_CARE_PROVIDER_SITE_OTHER): Payer: Medicare Other | Admitting: Cardiology

## 2016-12-13 ENCOUNTER — Other Ambulatory Visit (HOSPITAL_COMMUNITY)
Admission: RE | Admit: 2016-12-13 | Discharge: 2016-12-13 | Disposition: A | Discharge status: Home / Self Care | Payer: Medicare Other | Source: Ambulatory Visit | Attending: Cardiology | Admitting: Cardiology

## 2016-12-13 VITALS — BP 138/74 | HR 101 | Ht 64.0 in | Wt 178.0 lb

## 2016-12-13 DIAGNOSIS — I480 Paroxysmal atrial fibrillation: Secondary | ICD-10-CM | POA: Diagnosis present

## 2016-12-13 DIAGNOSIS — J449 Chronic obstructive pulmonary disease, unspecified: Secondary | ICD-10-CM | POA: Diagnosis not present

## 2016-12-13 DIAGNOSIS — I5032 Chronic diastolic (congestive) heart failure: Secondary | ICD-10-CM | POA: Diagnosis not present

## 2016-12-13 LAB — BASIC METABOLIC PANEL
ANION GAP: 8 (ref 5–15)
BUN: 14 mg/dL (ref 6–20)
CALCIUM: 8.8 mg/dL — AB (ref 8.9–10.3)
CO2: 28 mmol/L (ref 22–32)
Chloride: 100 mmol/L — ABNORMAL LOW (ref 101–111)
Creatinine, Ser: 0.76 mg/dL (ref 0.44–1.00)
GLUCOSE: 99 mg/dL (ref 65–99)
Potassium: 3.8 mmol/L (ref 3.5–5.1)
Sodium: 136 mmol/L (ref 135–145)

## 2016-12-13 LAB — CBC
HCT: 45.4 % (ref 36.0–46.0)
Hemoglobin: 15.2 g/dL — ABNORMAL HIGH (ref 12.0–15.0)
MCH: 30.5 pg (ref 26.0–34.0)
MCHC: 33.5 g/dL (ref 30.0–36.0)
MCV: 91 fL (ref 78.0–100.0)
PLATELETS: 290 10*3/uL (ref 150–400)
RBC: 4.99 MIL/uL (ref 3.87–5.11)
RDW: 13.9 % (ref 11.5–15.5)
WBC: 17.2 10*3/uL — AB (ref 4.0–10.5)

## 2016-12-13 NOTE — Patient Instructions (Signed)
Your physician wants you to follow-up in: 6 months with Dr Ferne Reus will receive a reminder letter in the mail two months in advance. If you don't receive a letter, please call our office to schedule the follow-up appointment.    Your physician recommends that you continue on your current medications as directed. Please refer to the Current Medication list given to you today.     Labs TODAY: cbc,bmet    If you need a refill on your cardiac medications before your next appointment, please call your pharmacy.     Thank you for choosing [facility] !

## 2017-01-18 ENCOUNTER — Encounter: Payer: Self-pay | Admitting: Gynecologic Oncology

## 2017-01-18 ENCOUNTER — Ambulatory Visit: Payer: Medicare Other | Attending: Gynecologic Oncology | Admitting: Gynecologic Oncology

## 2017-01-18 VITALS — BP 132/70 | HR 102 | Temp 97.8°F | Resp 18 | Ht 64.0 in | Wt 173.4 lb

## 2017-01-18 DIAGNOSIS — Z7901 Long term (current) use of anticoagulants: Secondary | ICD-10-CM | POA: Diagnosis not present

## 2017-01-18 DIAGNOSIS — Z888 Allergy status to other drugs, medicaments and biological substances status: Secondary | ICD-10-CM | POA: Insufficient documentation

## 2017-01-18 DIAGNOSIS — Z87891 Personal history of nicotine dependence: Secondary | ICD-10-CM | POA: Diagnosis not present

## 2017-01-18 DIAGNOSIS — Z885 Allergy status to narcotic agent status: Secondary | ICD-10-CM | POA: Diagnosis not present

## 2017-01-18 DIAGNOSIS — E039 Hypothyroidism, unspecified: Secondary | ICD-10-CM | POA: Insufficient documentation

## 2017-01-18 DIAGNOSIS — Z7952 Long term (current) use of systemic steroids: Secondary | ICD-10-CM

## 2017-01-18 DIAGNOSIS — Z7951 Long term (current) use of inhaled steroids: Secondary | ICD-10-CM | POA: Diagnosis not present

## 2017-01-18 DIAGNOSIS — K5190 Ulcerative colitis, unspecified, without complications: Secondary | ICD-10-CM | POA: Diagnosis not present

## 2017-01-18 DIAGNOSIS — I480 Paroxysmal atrial fibrillation: Secondary | ICD-10-CM | POA: Insufficient documentation

## 2017-01-18 DIAGNOSIS — E669 Obesity, unspecified: Secondary | ICD-10-CM | POA: Diagnosis not present

## 2017-01-18 DIAGNOSIS — G8929 Other chronic pain: Secondary | ICD-10-CM | POA: Diagnosis not present

## 2017-01-18 DIAGNOSIS — M797 Fibromyalgia: Secondary | ICD-10-CM | POA: Insufficient documentation

## 2017-01-18 DIAGNOSIS — Z801 Family history of malignant neoplasm of trachea, bronchus and lung: Secondary | ICD-10-CM | POA: Insufficient documentation

## 2017-01-18 DIAGNOSIS — R971 Elevated cancer antigen 125 [CA 125]: Secondary | ICD-10-CM | POA: Diagnosis not present

## 2017-01-18 DIAGNOSIS — Z6829 Body mass index (BMI) 29.0-29.9, adult: Secondary | ICD-10-CM | POA: Insufficient documentation

## 2017-01-18 DIAGNOSIS — I4891 Unspecified atrial fibrillation: Secondary | ICD-10-CM

## 2017-01-18 DIAGNOSIS — K529 Noninfective gastroenteritis and colitis, unspecified: Secondary | ICD-10-CM

## 2017-01-18 DIAGNOSIS — Z800 Family history of malignant neoplasm of digestive organs: Secondary | ICD-10-CM | POA: Diagnosis not present

## 2017-01-18 DIAGNOSIS — Z8249 Family history of ischemic heart disease and other diseases of the circulatory system: Secondary | ICD-10-CM | POA: Insufficient documentation

## 2017-01-18 DIAGNOSIS — J449 Chronic obstructive pulmonary disease, unspecified: Secondary | ICD-10-CM | POA: Diagnosis not present

## 2017-01-18 DIAGNOSIS — N83291 Other ovarian cyst, right side: Secondary | ICD-10-CM | POA: Insufficient documentation

## 2017-01-18 DIAGNOSIS — N83201 Unspecified ovarian cyst, right side: Secondary | ICD-10-CM | POA: Diagnosis not present

## 2017-01-18 DIAGNOSIS — K219 Gastro-esophageal reflux disease without esophagitis: Secondary | ICD-10-CM | POA: Insufficient documentation

## 2017-01-18 DIAGNOSIS — Z79899 Other long term (current) drug therapy: Secondary | ICD-10-CM | POA: Diagnosis not present

## 2017-01-18 DIAGNOSIS — M549 Dorsalgia, unspecified: Secondary | ICD-10-CM | POA: Insufficient documentation

## 2017-01-18 DIAGNOSIS — F329 Major depressive disorder, single episode, unspecified: Secondary | ICD-10-CM | POA: Insufficient documentation

## 2017-01-18 NOTE — Progress Notes (Signed)
Consult Note: Gyn-Onc  Consult was requested by Dr. Lynnette Caffey for the evaluation of [name] 83 y.o. female  CC:  Chief Complaint  Patient presents with  . Elevated CA-125  Right ovarian cyst  Assessment/Plan:  [name]  is a 83 y.o.  year old with a 4cm simple right ovarian cyst in the setting of an elevated CA 125 and multiple medical comorbidities including obesity, ulcerative colitis, chronic back pain (on steroids), atrial fibrillation on xarelto.  I reviewed her CT images with the patient and her daughter. I discussed that I felt strongly that the cyst was benign as it has remained very stable in dimensions over 6 years of imaging and is simple in appearance. I do recommend repeating the CT imaging in light of the recent elevation to confirm that there has been no change since August, 2017. If stable, I would recommend no further imaging or surveillance CA 125 checks as these are likely being falsely elevated by her underlying colitis. She might also benefit from a CEA to assess for possible occult colon cancer in light of her history of ulcerative colitis (she was told she had "aged out of further colonoscopies").   If the imaging shows changes in the right ovary or extra ovarian disease, I would recommend surgical evaluation.   HPI: [name] is an 83 year old woman who is seen in consultation at the request of Dr Lynnette Caffey for a 4cm right ovarian cyst and elevated CA 125.  The cyst was first detected on imaging (CT) in 2012 and was approximately 4cm. It was again seen on a CT scan in August, 2017 and was stable in dimension, and described as likely being benign.   Her CA 125 in May 2017 was 45. It was 62 in August, 2017.   She was scheduled for a repeat CA 125 in March, 13, 2018 this was elevated to 133.5.  The patient denies abdominal bloating or early satiety. She has no vaginal bleeding.  Her major complaint is back pain which has been very bad in the past  6 months and she has been regularly on steroid tapers (most recently completed 2 weeks ago). She has received multiple joint injections for this also.  She takes Xarelto for atrial fibrillation. She has ulcerative colitis. Her bouts have been less prominent lately, likely secondary to prednisone use for her back.  Current Meds:  Outpatient Encounter Prescriptions as of 81/13/2018  Medication Sig  . acetaminophen (TYLENOL) 325 MG tablet Take 2 tablets (650 mg total) by mouth every 6 (six) hours as needed (or Fever >/= 101).  Marland Kitchen albuterol (PROVENTIL HFA;VENTOLIN HFA) 108 (90 Base) MCG/ACT inhaler Inhale 2 puffs into the lungs every 6 (six) hours as needed for wheezing or shortness of breath.  . ALPRAZolam (XANAX) 0.5 MG tablet Take 0.5-1 tablets (0.25-0.5 mg total) by mouth at bedtime as needed. Sleep  . CARTIA XT 120 MG 24 hr capsule TAKE 1 CAPSULE BY MOUTH EVERY DAY  . cholestyramine (QUESTRAN) 4 g packet Take 1 packet (4 g total) by mouth 2 (two) times daily.  . diphenoxylate-atropine (LOMOTIL) 2.5-0.025 MG tablet Take 1 tablet by mouth 4 (four) times daily.  . fluticasone (FLONASE) 50 MCG/ACT nasal spray Place 2 sprays into both nostrils 2 (two) times daily. For nasal congestion  . ipratropium (ATROVENT) 0.03 % nasal spray Place 2 sprays into both nostrils 2 (two) times daily.  Marland Kitchen levothyroxine (SYNTHROID, LEVOTHROID) 125 MCG tablet Take 125 mcg by mouth  every morning. For thyroid therapy  . methylcellulose (ARTIFICIAL TEARS) 1 % ophthalmic solution Place 1 drop into both eyes 2 (two) times daily as needed. Dry eyes  . pregabalin (LYRICA) 50 MG capsule Take 50 mg by mouth at bedtime.  . traMADol (ULTRAM) 50 MG tablet Take 50 mg by mouth every 6 (six) hours as needed (for pain).   Alveda Reasons 20 MG TABS tablet TAKE 1 TABLET BY MOUTH EVERY DAY WITH SUPPER  . budesonide-formoterol (SYMBICORT) 160-4.5 MCG/ACT inhaler Inhale 2 puffs into the lungs 2 (two) times daily.  . [DISCONTINUED] clobetasol cream  (TEMOVATE) 0.05 % as needed.   . [DISCONTINUED] predniSONE (DELTASONE) 10 MG tablet 4 tabs X 3 days, 3 tabs X 3 days, 2 tabs X 3 days, 1 tabs, 3 days then stop   Facility-Administered Encounter Medications as of 01/18/2017  Medication  . cholestyramine (QUESTRAN) packet 4 g    Allergy:  Allergies  Allergen Reactions  . Codeine Itching  . Cymbalta [Duloxetine Hcl] Itching    Social Hx:   Social History   Social History  . Marital status: Divorced    Spouse name: N/A  . Number of children: 1  . Years of education: 60   Occupational History  . retired Retired   Social History Main Topics  . Smoking status: Former Smoker    Packs/day: 1.00    Years: 20.00    Types: Cigarettes    Quit date: 10/08/1980  . Smokeless tobacco: Never Used  . Alcohol use Yes     Comment: rarely, 12-23-15 rarely  . Drug use: No  . Sexual activity: Not on file   Other Topics Concern  . Not on file   Social History Narrative   Patient lives at home by herself.    Patient is retired.    Patient has 12 th grade education.           Past Surgical Hx:  Past Surgical History:  Procedure Laterality Date  . CHOLECYSTECTOMY  Dec 04, 2010  . FEMUR IM NAIL Left 12/11/2013   Procedure: INTRAMEDULLARY (IM) NAIL FEMORAL;  Surgeon: [name], MD;  Location: [facility];  Service: Orthopedics;  Laterality: Left;  . FRACTURE SURGERY Left 12/11/2013   hip  . KNEE SURGERY Left    x 2  . TOTAL KNEE REVISION  08/06/2012   Procedure: TOTAL KNEE REVISION;  Surgeon: [name], MD;  Location: [facility];  Service: Orthopedics;  Laterality: Left;  Left Total Knee Arthroplasty Revision  . Uterine polypectomy      Past Medical Hx:  Past Medical History:  Diagnosis Date  . COPD (chronic obstructive pulmonary disease) ([place])   . Depression   . Diverticulosis   . Family hx of colon cancer   . Fibromyalgia   . GERD (gastroesophageal reflux disease)   . Hemorrhoids   . Hiatal hernia   . History of shingles    . Hypothyroidism   . IBS (irritable bowel syndrome)   . Lymphocytic colitis   . Osteoporosis   . Paroxysmal atrial fibrillation (H[facility])   . Psoriatic arthritis ([place])   . Rectal polyp   . Schatzki's ring   . Uterine polyp     Past Gynecological History:   No LMP recorded. Patient is postmenopausal.  Family Hx:  Family History  Problem Relation Age of Onset  . Heart disease Father     pacemaker  . Lung cancer Father     hx of smoker  .  Colon cancer Mother     dx in her mid 54's  . Colon cancer Maternal Grandfather   . Depression Sister     Review of Systems:  Constitutional  Feels well,    ENT Normal appearing ears and nares bilaterally Skin/Breast  No rash, sores, jaundice, itching, dryness Cardiovascular  No chest pain, shortness of breath, or edema  Pulmonary  No cough or wheeze.  Gastro Intestinal  No nausea, vomitting, or diarrhoea. No bright red blood per rectum, no abdominal pain, change in bowel movement, or constipation.  Genito Urinary  No frequency, urgency, dysuria, no bleeding Musculo Skeletal  + back pain Neurologic  No weakness, numbness, change in gait,  Psychology  No depression, anxiety, insomnia.   Vitals:  Blood pressure 132/70, pulse (!) 102, temperature 97.8 F (36.6 C), temperature source Oral, resp. rate 18, height 5' 4"  (1.626 m), weight 173 lb 6.4 oz (78.7 kg), SpO2 96 %.  Physical Exam: WD in NAD Neck  Supple NROM, without any enlargements.  Lymph Node Survey No cervical supraclavicular or inguinal adenopathy Cardiovascular  Pulse normal rate, regularity and rhythm. S1 and S2 normal.  Lungs  Clear to auscultation bilateraly, without wheezes/crackles/rhonchi. Good air movement.  Skin  No rash/lesions/breakdown  Psychiatry  Alert and oriented to person, place, and time  Abdomen  Normoactive bowel sounds, abdomen soft, non-tender and obese without evidence of hernia.  Back No CVA tenderness Genito Urinary  Vulva/vagina:  Normal external female genitalia.  No lesions. No discharge or bleeding.  Bladder/urethra:  No lesions or masses, well supported bladder  Vagina: normal  Cervix: Normal appearing, no lesions.  Uterus:  Small, mobile, no parametrial involvement or nodularity.  Adnexa: no palpable masses. Rectal  deferred Extremities  No bilateral cyanosis, clubbing or edema.   Donaciano Eva, MD  01/18/2017, 12:49 PM

## 2017-01-18 NOTE — Patient Instructions (Signed)
Dr. Denman George ordered a CT of your abdomen and pelvis. You will be called with those results.  No follow up currently needed with Dr. Denman George.

## 2017-01-25 ENCOUNTER — Telehealth: Payer: Self-pay | Admitting: *Deleted

## 2017-01-25 NOTE — Telephone Encounter (Signed)
Late entry----today at 11:15 am  I have called central scheduling to schedule the CT scan. I have given May 3rd at [place], arrive at 8:45am. Patient to drink first bottle on contrast at 7am, and second bottle at 8am. Patient needs labs prior to the scan

## 2017-01-25 NOTE — Telephone Encounter (Signed)
Patient called regarding her scan appt. I have given her the date/time of May 3rd and arrive at 8:45am.  Patient instructions given to drink first bottle on contrast at 7am and second bottle at 8am.  Patient will need lab work prior to the scan (BUN/creatine)

## 2017-01-27 ENCOUNTER — Other Ambulatory Visit: Payer: Self-pay | Admitting: Cardiology

## 2017-01-31 ENCOUNTER — Telehealth: Payer: Self-pay

## 2017-01-31 NOTE — Telephone Encounter (Signed)
Told [name] that she will need to go to [facility] on 02-07-17 at 0830 and the labs will be drawn prior to CT Scan.Pt verbalized understanding.  Labs in epic and ordered stat.

## 2017-02-07 ENCOUNTER — Other Ambulatory Visit (HOSPITAL_COMMUNITY)
Admission: RE | Admit: 2017-02-07 | Discharge: 2017-02-07 | Disposition: A | Discharge status: Home / Self Care | Payer: Medicare Other | Source: Ambulatory Visit | Attending: Gynecologic Oncology | Admitting: Gynecologic Oncology

## 2017-02-07 ENCOUNTER — Ambulatory Visit (HOSPITAL_COMMUNITY)
Admission: RE | Admit: 2017-02-07 | Discharge: 2017-02-07 | Disposition: A | Discharge status: Home / Self Care | Payer: Medicare Other | Source: Ambulatory Visit | Attending: Gynecologic Oncology | Admitting: Gynecologic Oncology

## 2017-02-07 DIAGNOSIS — I700 Atherosclerosis of aorta: Secondary | ICD-10-CM | POA: Diagnosis not present

## 2017-02-07 DIAGNOSIS — N83201 Unspecified ovarian cyst, right side: Secondary | ICD-10-CM | POA: Insufficient documentation

## 2017-02-07 DIAGNOSIS — K5730 Diverticulosis of large intestine without perforation or abscess without bleeding: Secondary | ICD-10-CM | POA: Insufficient documentation

## 2017-02-07 DIAGNOSIS — R971 Elevated cancer antigen 125 [CA 125]: Secondary | ICD-10-CM

## 2017-02-07 DIAGNOSIS — M4856XA Collapsed vertebra, not elsewhere classified, lumbar region, initial encounter for fracture: Secondary | ICD-10-CM | POA: Insufficient documentation

## 2017-02-07 LAB — COMPREHENSIVE METABOLIC PANEL
ALBUMIN: 3.5 g/dL (ref 3.5–5.0)
ALK PHOS: 44 U/L (ref 38–126)
ALT: 16 U/L (ref 14–54)
ANION GAP: 6 (ref 5–15)
AST: 19 U/L (ref 15–41)
BILIRUBIN TOTAL: 0.6 mg/dL (ref 0.3–1.2)
BUN: 7 mg/dL (ref 6–20)
CALCIUM: 8.4 mg/dL — AB (ref 8.9–10.3)
CO2: 27 mmol/L (ref 22–32)
Chloride: 103 mmol/L (ref 101–111)
Creatinine, Ser: 0.67 mg/dL (ref 0.44–1.00)
GFR calc Af Amer: >60 mL/min (ref >60)
GFR calc non Af Amer: >60 mL/min (ref >60)
Glucose, Bld: 107 mg/dL — ABNORMAL HIGH (ref 65–99)
POTASSIUM: 4 mmol/L (ref 3.5–5.1)
Sodium: 136 mmol/L (ref 135–145)
TOTAL PROTEIN: 7 g/dL (ref 6.5–8.1)

## 2017-02-07 LAB — POCT I-STAT CREATININE: Creatinine, Ser: 0.8 mg/dL (ref 0.44–1.00)

## 2017-02-07 MED ORDER — IOPAMIDOL (ISOVUE-300) INJECTION 61%
100.0000 mL | Freq: Once | INTRAVENOUS | Status: AC | PRN
  Administered 2017-02-07: 100 mL via INTRAVENOUS

## 2017-02-08 ENCOUNTER — Telehealth: Payer: Self-pay

## 2017-02-08 NOTE — Telephone Encounter (Signed)
LM for pt to call back for results of scan.

## 2017-02-08 NOTE — Telephone Encounter (Signed)
-----   Message from Everitt Amber, MD sent at 02/07/2017  4:13 PM EDT ----- Would you mind letting Ms Eastburn know that her CT shows that the cyst is stable and therefore I am not recommending further treatment or future scans to monitor it.  Everitt Amber

## 2017-02-08 NOTE — Telephone Encounter (Signed)
Told Ms Knodel the results of the CT scan as noted below by Dr. Denman George.

## 2017-02-22 ENCOUNTER — Telehealth: Payer: Self-pay | Admitting: Emergency Medicine

## 2017-02-22 NOTE — Telephone Encounter (Signed)
Called and spoke with pt and she stated that she is in the donut hole and she stated that the anoro is cheaper than the symbicort and would like to change to the anoro.  RB please advise. Thanks   Allergies  Allergen Reactions  . Codeine Itching  . Cymbalta [Duloxetine Hcl] Itching

## 2017-02-24 NOTE — Telephone Encounter (Signed)
I am ok with change to Anoro

## 2017-02-25 MED ORDER — UMECLIDINIUM-VILANTEROL 62.5-25 MCG/INH IN AEPB: 1.0000 | INHALATION_SPRAY | Freq: Every day | RESPIRATORY_TRACT | 6 refills | Status: DC

## 2017-02-25 NOTE — Telephone Encounter (Signed)
[phone] pt calling back

## 2017-02-25 NOTE — Telephone Encounter (Signed)
Left detailed message on confirmed voicemail that Anoro has been sent to [place]. Nothing further needed.

## 2017-04-01 ENCOUNTER — Encounter: Payer: Self-pay | Admitting: Emergency Medicine

## 2017-04-01 ENCOUNTER — Ambulatory Visit (INDEPENDENT_AMBULATORY_CARE_PROVIDER_SITE_OTHER): Payer: Medicare Other | Admitting: Emergency Medicine

## 2017-04-01 DIAGNOSIS — J301 Allergic rhinitis due to pollen: Secondary | ICD-10-CM | POA: Diagnosis not present

## 2017-04-01 DIAGNOSIS — J438 Other emphysema: Secondary | ICD-10-CM

## 2017-04-01 NOTE — Assessment & Plan Note (Signed)
Please complete your current supply of Anoro. We will then change back to Symbicort 2 puffs twice a day. Remember to rinse and gargle after using. Continue albuterol as needed Flu shot in the Fall Follow with Dr Lamonte Sakai in 6 months or sooner if you have any problems

## 2017-04-01 NOTE — Assessment & Plan Note (Signed)
Currently using atrovent NS and nasal steroid prn.

## 2017-04-01 NOTE — Progress Notes (Signed)
Subjective:    Patient ID: [name], female    DOB: August 21, 1933, 83 y.o.   MRN: [id number] HPI ROV 05/28/16 -- Patient follows up today for her history of COPD, chronic rhinitis, chronic cough. Also with a history of psoriatic arthritis And former methotrexate use. At our last visit we decided to retry Anoro to see if she would benefit. She has noticed some increase in cough w the medication. She is not sure that it is helping her breathing very much.  She coughed with spiriva handihaler also. Tolerated spiriva respimat. Has been on xopenex in past, no longer paid for.   ROV 08/06/16 -- This follow-up visit for patient with a history of COPD, chronic rhinitis, chronic cough and psoriatic arthritis. She has been on methotrexate in the past but not currently. Has tried multiple bronchodilators but has had side effects particularly side effect of cough. We stopped Advair, started symbicort that was then substituted with Dulera. She feels that the sym bicort worked better for her - opened her up better. Throat irritation is better. She is still having chronic rhinitis, more that normal, more cough than normal. She is on flonase qd and astelin.  She uses albuterol less than before. Helps her breathing.  ROV 11/22/16 -- This is a follow-up visit for patient with a history of COPD psoriatic arthritis on methotrexate in the past (not currently), chronic rhinitis and chronic cough. She reports that she has been dealing with a URI since about 9 days ago. She was treated with empiric doxy, also w tamiflu. Still coughing up clear to white. She has more wheeze. She had to stop symbicort, restarted it in January when her insurance changed. Remains on flonase and atrovent Ns bid she uses albuterol about once a day (more than usual).   ROV 04/01/17 -- Patient has a history of COPD, psoriatic arthritis previously on methotrexate, chronic rhinitis, chronic cough. She reports that she has been having trouble with back  pain, DDD. Has been quite limited since last visit due to this. Her breathing has been fairly stable - she has been using Anoro, but believes that the Symbicort is better, so she wants to change back. She is using albuterol rarely. She is not using nasal steroid or Atrovent NS right now.  Objective:   Physical Exam Vitals:   04/01/17 1115 04/01/17 1116  BP:  124/74  Pulse:  95  SpO2:  96%  Weight: 179 lb (81.2 kg)   Height: 5' 4"  (1.626 m)    Gen: Pleasant, overwt, in no distress,  normal affect  ENT: No lesions,  mouth clear,  oropharynx clear, no postnasal drip  Neck: No JVD, no TMG, no carotid bruits  Lungs: No use of accessory muscles, no wheezes  Cardiovascular: RRR, heart sounds normal, no murmur or gallops, no peripheral edema  Musculoskeletal: No deformities, no cyanosis or clubbing  Neuro: alert, non focal  Skin: Warm, no lesions or rashes   Assessment & Plan:  COPD (chronic obstructive pulmonary disease) Please complete your current supply of Anoro. We will then change back to Symbicort 2 puffs twice a day. Remember to rinse and gargle after using. Continue albuterol as needed Flu shot in the Fall Follow with Dr Lamonte Sakai in 6 months or sooner if you have any problems  Allergic rhinitis, seasonal Currently using atrovent NS and nasal steroid prn.   Baltazar Apo, MD, PhD 04/01/2017, 11:35 AM [facility] [phone] or if no answer [phone]

## 2017-04-01 NOTE — Patient Instructions (Signed)
Please complete your current supply of Anoro. We will then change back to Symbicort 2 puffs twice a day. Remember to rinse and gargle after using. Continue albuterol as needed Flu shot in the Fall Follow with Dr Lamonte Sakai in 6 months or sooner if you have any problems

## 2017-04-11 ENCOUNTER — Other Ambulatory Visit: Payer: Self-pay | Admitting: Internal Medicine

## 2017-04-20 ENCOUNTER — Other Ambulatory Visit: Payer: Self-pay | Admitting: Cardiology

## 2017-04-22 ENCOUNTER — Other Ambulatory Visit: Payer: Self-pay | Admitting: Cardiology

## 2017-05-03 ENCOUNTER — Other Ambulatory Visit: Payer: Self-pay | Admitting: *Deleted

## 2017-05-03 MED ORDER — DILTIAZEM HCL ER COATED BEADS 120 MG PO CP24: ORAL_CAPSULE | ORAL | 3 refills | Status: DC

## 2017-05-20 ENCOUNTER — Encounter (HOSPITAL_COMMUNITY)
Admission: RE | Admit: 2017-05-20 | Discharge: 2017-05-20 | Disposition: A | Discharge status: Home / Self Care | Payer: Medicare Other | Source: Ambulatory Visit | Attending: Internal Medicine | Admitting: Internal Medicine

## 2017-05-20 DIAGNOSIS — M810 Age-related osteoporosis without current pathological fracture: Secondary | ICD-10-CM | POA: Diagnosis present

## 2017-05-20 MED ORDER — DENOSUMAB 60 MG/ML ~~LOC~~ SOLN
60.0000 mg | Freq: Once | SUBCUTANEOUS | Status: AC
  Administered 2017-05-20: 60 mg via SUBCUTANEOUS
  Filled 2017-05-20: qty 1

## 2017-06-17 NOTE — Progress Notes (Signed)
Cardiology Office Note  Date: 06/18/2017   ID: [name], DOB 1933-01-18, MRN [id number]  PCP: Asencion Noble, MD  Primary Cardiologist: Rozann Lesches, MD   Chief Complaint  Patient presents with  . Atrial Fibrillation    History of Present Illness: [name] is an 83 y.o. female that I met back in March. She presents for a routine follow-up visit. Since last encounter she has not had any significant palpitations. She reports compliance with her medications.  Follow-up lab work from March is outlined below.  I personally reviewed her recent tracing from 05/20/2017 which showed normal sinus rhythm. This was obtained during an office visit with Dr. Willey Blade.  She continues on Cardizem CD and Xarelto. Reports no bleeding problems.  Past Medical History:  Diagnosis Date  . COPD (chronic obstructive pulmonary disease) ([place])   . Depression   . Diverticulosis   . Family hx of colon cancer   . Fibromyalgia   . GERD (gastroesophageal reflux disease)   . Hemorrhoids   . Hiatal hernia   . History of shingles   . Hypothyroidism   . IBS (irritable bowel syndrome)   . Lymphocytic colitis   . Osteoporosis   . Paroxysmal atrial fibrillation ([facility])   . Psoriatic arthritis ([place])   . Rectal polyp   . Schatzki's ring   . Uterine polyp     Past Surgical History:  Procedure Laterality Date  . CHOLECYSTECTOMY  Dec 04, 2010  . FEMUR IM NAIL Left 12/11/2013   Procedure: INTRAMEDULLARY (IM) NAIL FEMORAL;  Surgeon: [name], MD;  Location: [facility];  Service: Orthopedics;  Laterality: Left;  . FRACTURE SURGERY Left 12/11/2013   hip  . KNEE SURGERY Left    x 2  . TOTAL KNEE REVISION  08/06/2012   Procedure: TOTAL KNEE REVISION;  Surgeon: [name], MD;  Location: [facility];  Service: Orthopedics;  Laterality: Left;  Left Total Knee Arthroplasty Revision  . Uterine polypectomy      Current Outpatient Prescriptions  Medication Sig Dispense Refill  . acetaminophen  (TYLENOL) 325 MG tablet Take 2 tablets (650 mg total) by mouth every 6 (six) hours as needed (or Fever >/= 101). 60 tablet 0  . albuterol (PROVENTIL HFA;VENTOLIN HFA) 108 (90 Base) MCG/ACT inhaler Inhale 2 puffs into the lungs every 6 (six) hours as needed for wheezing or shortness of breath. 1 Inhaler 6  . ALPRAZolam (XANAX) 0.5 MG tablet Take 0.5-1 tablets (0.25-0.5 mg total) by mouth at bedtime as needed. Sleep 30 tablet 0  . cholestyramine (QUESTRAN) 4 g packet Take 1 packet (4 g total) by mouth 2 (two) times daily. 60 packet 11  . diltiazem (CARTIA XT) 120 MG 24 hr capsule TAKE 1 CAPSULE BY MOUTH EVERY DAY 90 capsule 3  . diphenoxylate-atropine (LOMOTIL) 2.5-0.025 MG tablet Take 1 tablet by mouth 4 (four) times daily. 360 tablet 1  . fluticasone (FLONASE) 50 MCG/ACT nasal spray Place 2 sprays into both nostrils 2 (two) times daily. For nasal congestion    . HYDROmorphone (DILAUDID) 4 MG tablet Take 4 mg by mouth 3 (three) times daily.    Marland Kitchen ipratropium (ATROVENT) 0.03 % nasal spray Place 2 sprays into both nostrils 2 (two) times daily. 30 mL 5  . levothyroxine (SYNTHROID, LEVOTHROID) 125 MCG tablet Take 125 mcg by mouth every morning. For thyroid therapy    . methotrexate (50 MG/ML) 1 g injection Inject 250 mg into the vein once a week.    Marland Kitchen  methylcellulose (ARTIFICIAL TEARS) 1 % ophthalmic solution Place 1 drop into both eyes 2 (two) times daily as needed. Dry eyes    . umeclidinium-vilanterol (ANORO ELLIPTA) 62.5-25 MCG/INH AEPB Inhale 1 puff into the lungs daily. 60 each 6  . XARELTO 20 MG TABS tablet TAKE 1 TABLET BY MOUTH EVERY DAY WITH SUPPER 90 tablet 0   Current Facility-Administered Medications  Medication Dose Route Frequency Provider Last Rate Last Dose  . cholestyramine (QUESTRAN) packet 4 g  4 g Oral BID [name], MD       Allergies:  Codeine   Social History: The patient  reports that she quit smoking about 36 years ago. Her smoking use included Cigarettes. She has a 20.00  pack-year smoking history. She has never used smokeless tobacco. She reports that she drinks alcohol. She reports that she does not use drugs.   ROS:  Please see the history of present illness. Otherwise, complete review of systems is positive for chronic back pain, using a walker.  All other systems are reviewed and negative.   Physical Exam: VS:  BP 138/76   Pulse 100   Ht 5' 4" (1.626 m)   Wt 181 lb (82.1 kg)   SpO2 97%   BMI 31.07 kg/m , BMI Body mass index is 31.07 kg/m.  Wt Readings from Last 3 Encounters:  06/18/17 181 lb (82.1 kg)  05/20/17 174 lb (78.9 kg)  04/01/17 179 lb (81.2 kg)    General: Elderly woman, appears comfortable at rest. HEENT: Conjunctiva and lids normal, oropharynx clear. Neck: Supple, no elevated JVP or carotid bruits, no thyromegaly. Lungs: Clear to auscultation, nonlabored breathing at rest. Cardiac: Regular rate and rhythm, no S3 or significant systolic murmur, no pericardial rub. Abdomen: Soft, nontender, bowel sounds present, no guarding or rebound. Extremities: No pitting edema, distal pulses 2+. Skin: Warm and dry. Musculoskeletal: No kyphosis. Neuropsychiatric: Alert and oriented x3, affect grossly appropriate.  ECG: I personally reviewed the tracing from 12/02/2015 which showed normal sinus rhythm with low voltage.  Recent Labwork: 12/13/2016: Hemoglobin 15.2; Platelets 290 02/07/2017: ALT 16; AST 19; BUN 7; Creatinine, Ser 0.67; Potassium 4.0; Sodium 136   Other Studies Reviewed Today:  Echocardiogram 09/05/2015: Study Conclusions  - Left ventricle: The cavity size was normal. Wall thickness was increased in a pattern of mild LVH. Systolic function was normal. The estimated ejection fraction was in the range of 60% to 65%. Wall motion was normal; there were no regional wall motion abnormalities. Doppler parameters are consistent with abnormal left ventricular relaxation (grade 1 diastolic dysfunction). The E/e&' ratio is  between 8-15, suggesting indeterminate LV filling pressure. - Left atrium: The atrium was normal in size. - Tricuspid valve: There was trivial regurgitation. - Pulmonary arteries: PA peak pressure: 23 mm Hg (S). - Inferior vena cava: The vessel was normal in size. The respirophasic diameter changes were in the normal range (>= 50%), consistent with normal central venous pressure.  Impressions:  - Compared to a prior study in 2014, there are few changes. EF is 99-83%, diastolic dysfunction and normal biatrial size.  Assessment and Plan:  1. Paroxysmal atrial fibrillation, symptomatically stable on current regimen which includes Xarelto. She reports no bleeding problems. Obtain CBC and BMET for her next visit.  2. History of diastolic heart failure, clinically stable without evidence of fluid overload. She is not on a standing diuretic.  Current medicines were reviewed with the patient today.   Orders Placed This Encounter  Procedures  . EKG  12-Lead    Disposition: Follow-up in 6 months.  Signed, Satira Sark, MD, [facility] 06/18/2017 2:28 PM    [place] [facility] [address], [place], [place] [address] Phone: [phone]; Fax: [phone]

## 2017-06-18 ENCOUNTER — Encounter: Payer: Self-pay | Admitting: Cardiology

## 2017-06-18 ENCOUNTER — Ambulatory Visit (INDEPENDENT_AMBULATORY_CARE_PROVIDER_SITE_OTHER): Payer: Medicare Other | Admitting: Cardiology

## 2017-06-18 VITALS — BP 138/76 | HR 100 | Ht 64.0 in | Wt 181.0 lb

## 2017-06-18 DIAGNOSIS — I5032 Chronic diastolic (congestive) heart failure: Secondary | ICD-10-CM

## 2017-06-18 DIAGNOSIS — I480 Paroxysmal atrial fibrillation: Secondary | ICD-10-CM

## 2017-06-18 NOTE — Patient Instructions (Addendum)
Your physician wants you to follow-up in: 6 months with Dr Ferne Reus will receive a reminder letter in the mail two months in advance. If you don't receive a letter, please call our office to schedule the follow-up appointment.    Your physician recommends that you continue on your current medications as directed. Please refer to the Current Medication list given to you today.    If you need a refill on your cardiac medications before your next appointment, please call your pharmacy.    Please get lab work JUST BEFORE next visit : CBC,BMET   Xarelto 20 mg samples #6 bottles, lot [id number], exp 02/21    Thank you for choosing [facility] !

## 2017-06-19 ENCOUNTER — Telehealth: Payer: Self-pay | Admitting: Emergency Medicine

## 2017-06-19 MED ORDER — ALBUTEROL SULFATE HFA 108 (90 BASE) MCG/ACT IN AERS: 2.0000 | INHALATION_SPRAY | Freq: Four times a day (QID) | RESPIRATORY_TRACT | 3 refills | Status: DC | PRN

## 2017-06-19 NOTE — Telephone Encounter (Signed)
Called pt to let her know ProAir was sent to her pharmacy in [place]. Nothing further is needed.

## 2017-06-24 ENCOUNTER — Encounter (HOSPITAL_COMMUNITY): Payer: Self-pay | Admitting: Emergency Medicine

## 2017-06-24 ENCOUNTER — Emergency Department (HOSPITAL_COMMUNITY)
Admission: EM | Admit: 2017-06-24 | Discharge: 2017-06-24 | Disposition: A | Discharge status: Home / Self Care | Payer: Medicare Other | Attending: Emergency Medicine | Admitting: Emergency Medicine

## 2017-06-24 ENCOUNTER — Emergency Department (HOSPITAL_COMMUNITY): Payer: Medicare Other

## 2017-06-24 DIAGNOSIS — R1013 Epigastric pain: Secondary | ICD-10-CM | POA: Diagnosis not present

## 2017-06-24 DIAGNOSIS — Z79899 Other long term (current) drug therapy: Secondary | ICD-10-CM | POA: Diagnosis not present

## 2017-06-24 DIAGNOSIS — Z87891 Personal history of nicotine dependence: Secondary | ICD-10-CM | POA: Diagnosis not present

## 2017-06-24 DIAGNOSIS — Z7901 Long term (current) use of anticoagulants: Secondary | ICD-10-CM | POA: Diagnosis not present

## 2017-06-24 DIAGNOSIS — N838 Other noninflammatory disorders of ovary, fallopian tube and broad ligament: Secondary | ICD-10-CM

## 2017-06-24 DIAGNOSIS — R1084 Generalized abdominal pain: Secondary | ICD-10-CM | POA: Diagnosis present

## 2017-06-24 DIAGNOSIS — J449 Chronic obstructive pulmonary disease, unspecified: Secondary | ICD-10-CM | POA: Diagnosis not present

## 2017-06-24 DIAGNOSIS — E039 Hypothyroidism, unspecified: Secondary | ICD-10-CM | POA: Insufficient documentation

## 2017-06-24 LAB — URINALYSIS, ROUTINE W REFLEX MICROSCOPIC
GLUCOSE, UA: NEGATIVE mg/dL
Nitrite: NEGATIVE
PH: 6 (ref 5.0–8.0)
PROTEIN: 100 mg/dL — AB
Specific Gravity, Urine: >1.03 — ABNORMAL HIGH (ref 1.005–1.030)

## 2017-06-24 LAB — CBC
HCT: 43.9 % (ref 36.0–46.0)
Hemoglobin: 14.4 g/dL (ref 12.0–15.0)
MCH: 28.7 pg (ref 26.0–34.0)
MCHC: 32.8 g/dL (ref 30.0–36.0)
MCV: 87.6 fL (ref 78.0–100.0)
PLATELETS: 445 10*3/uL — AB (ref 150–400)
RBC: 5.01 MIL/uL (ref 3.87–5.11)
RDW: 14.6 % (ref 11.5–15.5)
WBC: 9.7 10*3/uL (ref 4.0–10.5)

## 2017-06-24 LAB — COMPREHENSIVE METABOLIC PANEL
ALBUMIN: 3.1 g/dL — AB (ref 3.5–5.0)
ALT: 11 U/L — AB (ref 14–54)
AST: 18 U/L (ref 15–41)
Alkaline Phosphatase: 41 U/L (ref 38–126)
Anion gap: 8 (ref 5–15)
BILIRUBIN TOTAL: 0.6 mg/dL (ref 0.3–1.2)
BUN: 8 mg/dL (ref 6–20)
CHLORIDE: 103 mmol/L (ref 101–111)
CO2: 27 mmol/L (ref 22–32)
CREATININE: 0.75 mg/dL (ref 0.44–1.00)
Calcium: 8.5 mg/dL — ABNORMAL LOW (ref 8.9–10.3)
GFR calc Af Amer: >60 mL/min (ref >60)
GLUCOSE: 135 mg/dL — AB (ref 65–99)
Potassium: 4.4 mmol/L (ref 3.5–5.1)
Sodium: 138 mmol/L (ref 135–145)
TOTAL PROTEIN: 7.2 g/dL (ref 6.5–8.1)

## 2017-06-24 LAB — URINALYSIS, MICROSCOPIC (REFLEX)

## 2017-06-24 LAB — LIPASE, BLOOD: Lipase: 32 U/L (ref 11–51)

## 2017-06-24 MED ORDER — IOPAMIDOL (ISOVUE-300) INJECTION 61%
100.0000 mL | Freq: Once | INTRAVENOUS | Status: AC | PRN
  Administered 2017-06-24: 100 mL via INTRAVENOUS

## 2017-06-24 NOTE — Discharge Instructions (Signed)
Call [phone] tomorrow am to see when you can come in for the ultrasound

## 2017-06-24 NOTE — ED Notes (Signed)
Pt alert & oriented x4, stable gait. Patient given discharge instructions, paperwork & prescription(s). Patient verbalized understanding. Pt left department in wheelchair escorted by staff. Pt left w/ no further questions. 

## 2017-06-24 NOTE — ED Provider Notes (Signed)
[facility] DEPT Provider Note   CSN: [id number] Arrival date & time: 06/24/17  1528     History   Chief Complaint Chief Complaint  Patient presents with  . Abdominal Pain    HPI [name] is a 83 y.o. female.  Patient patient was sent over here by her family doctor to get a CT of the abdomen because she's been having cramping.   The history is provided by the patient.  Abdominal Pain   This is a new problem. The current episode started more than 2 days ago. The problem occurs constantly. The pain is associated with an unknown factor. The pain is located in the epigastric region. The quality of the pain is aching. Pertinent negatives include diarrhea, frequency, hematuria and headaches.    Past Medical History:  Diagnosis Date  . COPD (chronic obstructive pulmonary disease) ([place])   . Depression   . Diverticulosis   . Family hx of colon cancer   . Fibromyalgia   . GERD (gastroesophageal reflux disease)   . Hemorrhoids   . Hiatal hernia   . History of shingles   . Hypothyroidism   . IBS (irritable bowel syndrome)   . Lymphocytic colitis   . Osteoporosis   . Paroxysmal atrial fibrillation ([facility])   . Psoriatic arthritis ([place])   . Rectal polyp   . Schatzki's ring   . Uterine polyp     Patient Active Problem List   Diagnosis Date Noted  . Right ovarian cyst 01/18/2017  . Elevated CA-125 01/18/2017  . Depression 12/23/2015  . Peripheral edema 08/23/2015  . Fracture of hip, left, closed ([place]) 12/08/2013  . Hip fracture ([place]) 12/08/2013  . PAD (peripheral artery disease) ([place]) 11/25/2013  . Encounter for therapeutic drug monitoring 11/09/2013  . Atrial fibrillation ([place]) 05/07/2013  . Cough 03/20/2013  . Total knee replacement status 10/07/2012  . Knee pain 10/07/2012  . Knee stiffness 10/07/2012  . Tachycardia 09/17/2012  . Chest pain 09/17/2012  . Difficulty in walking(719.7) 09/16/2012  . Muscle weakness (generalized) 09/16/2012  . Postop Acute  blood loss anemia 08/08/2012  . 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 Bloating 02/14/2012  . Upper abdominal pain 02/14/2012  . Allergic rhinitis, seasonal 02/15/2011  . COLITIS 02/20/2010  . DIARRHEA 01/02/2010  . ABDOMINAL PAIN -GENERALIZED 01/02/2010  . PERSONAL HX COLONIC POLYPS 01/02/2010  . HYPOTHYROIDISM 12/28/2009  . COPD (chronic obstructive pulmonary disease) ([place]) 12/28/2009  . ARTHRITIS 12/28/2009    Past Surgical History:  Procedure Laterality Date  . CHOLECYSTECTOMY  Dec 04, 2010  . FEMUR IM NAIL Left 12/11/2013   Procedure: INTRAMEDULLARY (IM) NAIL FEMORAL;  Surgeon: [name], MD;  Location: [facility];  Service: Orthopedics;  Laterality: Left;  . FRACTURE SURGERY Left 12/11/2013   hip  . KNEE SURGERY Left    x 2  . TOTAL KNEE REVISION  08/06/2012   Procedure: TOTAL KNEE REVISION;  Surgeon: [name], MD;  Location: [facility];  Service: Orthopedics;  Laterality: Left;  Left Total Knee Arthroplasty Revision  . Uterine polypectomy      OB History    No data available       Home Medications    Prior to Admission medications   Medication Sig Start Date End Date Taking? Authorizing Provider  albuterol (PROAIR HFA) 108 (90 Base) MCG/ACT inhaler Inhale 2 puffs into the lungs every 6 (six) hours as needed for wheezing or shortness of breath. 06/19/17  Yes Collene Gobble, MD  ALPRAZolam (XANAX) 0.25 MG tablet Take 0.25 mg by mouth at bedtime as needed for anxiety.   Yes [provider]  budesonide-formoterol (SYMBICORT) 160-4.5 MCG/ACT inhaler Inhale 2 puffs into the lungs 2 (two) times daily.   Yes [provider]  diltiazem (CARTIA XT) 120 MG 24 hr capsule TAKE 1 CAPSULE BY MOUTH EVERY DAY 05/03/17  Yes Satira Sark, MD  diphenoxylate-atropine (LOMOTIL) 2.5-0.025 MG tablet Take 1 tablet by mouth 4 (four) times daily. 08/21/16  Yes [name], MD  escitalopram (LEXAPRO) 10 MG tablet Take 10 mg by mouth daily.   Yes [provider]  fluticasone (FLONASE) 50 MCG/ACT nasal spray Place 2 sprays into both nostrils 2 (two) times daily. For nasal congestion 11/20/13  Yes [provider]  HYDROmorphone (DILAUDID) 4 MG tablet Take 4 mg by mouth 3 (three) times daily.   Yes [provider]  levothyroxine (SYNTHROID, LEVOTHROID) 137 MCG tablet Take 137 mcg by mouth every morning. For thyroid therapy   Yes [provider]  methotrexate (50 MG/ML) 1 g injection Inject 250 mg into the vein every Tuesday.    Yes [provider]  methylcellulose (ARTIFICIAL TEARS) 1 % ophthalmic solution Place 1 drop into both eyes 2 (two) times daily as needed. Dry eyes   Yes [provider]  XARELTO 20 MG TABS tablet TAKE 1 TABLET BY MOUTH EVERY DAY WITH SUPPER 04/23/17  Yes Satira Sark, MD    Family History Family History  Problem Relation Age of Onset  . Heart disease Father        pacemaker  . Lung cancer Father        hx of smoker  . Colon cancer Mother        dx in her mid 13's  . Colon cancer Maternal Grandfather   . Depression Sister     Social History Social History  Substance Use Topics  . Smoking status: Former Smoker    Packs/day: 1.00    Years: 20.00    Types: Cigarettes    Quit date: 10/08/1980  . Smokeless tobacco: Never Used  . Alcohol use Yes     Comment: rarely, 12-23-15 rarely     Allergies   Codeine   Review of Systems Review of Systems  Constitutional: Negative for appetite change and fatigue.  HENT: Negative for congestion, ear discharge and sinus pressure.   Eyes: Negative for discharge.  Respiratory: Negative for cough.   Cardiovascular: Negative for chest pain.  Gastrointestinal: Positive for abdominal pain. Negative for diarrhea.  Genitourinary: Negative for frequency and hematuria.  Musculoskeletal: Negative for back pain.  Skin: Negative for rash.  Neurological: Negative for seizures and headaches.  Psychiatric/Behavioral: Negative  for hallucinations.     Physical Exam Updated Vital Signs BP (!) 142/65 (BP Location: Right Arm)   Pulse (!) 101   Temp 98.9 F (37.2 C) (Oral)   Resp 20   Ht 5\' 4"  (1.626 m)   Wt 80.7 kg (178 lb)   SpO2 95%   BMI 30.55 kg/m   Physical Exam  Constitutional: She is oriented to person, place, and time. She appears well-developed.  HENT:  Head: Normocephalic.  Eyes: Conjunctivae and EOM are normal. No scleral icterus.  Neck: Neck supple. No thyromegaly present.  Cardiovascular: Normal rate and regular rhythm.  Exam reveals no gallop and no friction rub.   No murmur heard. Pulmonary/Chest: No stridor. She has no wheezes. She has no rales. She exhibits no tenderness.  Abdominal: She exhibits no distension. There is tenderness. There is no rebound.  Musculoskeletal: Normal range of motion. She exhibits no edema.  Lymphadenopathy:    She has no cervical adenopathy.  Neurological: She is oriented to person, place, and time. She exhibits normal muscle tone. Coordination normal.  Skin: No rash noted. No erythema.  Psychiatric: She has a normal mood and affect. Her behavior is normal.     ED Treatments / Results  Labs (all labs ordered are listed, but only abnormal results are displayed) Labs Reviewed  COMPREHENSIVE METABOLIC PANEL - Abnormal; Notable for the following:       Result Value   Glucose, Bld 135 (*)    Calcium 8.5 (*)    Albumin 3.1 (*)    ALT 11 (*)    All other components within normal limits  CBC - Abnormal; Notable for the following:    Platelets 445 (*)    All other components within normal limits  URINALYSIS, ROUTINE W REFLEX MICROSCOPIC - Abnormal; Notable for the following:    Specific Gravity, Urine >1.030 (*)    Hgb urine dipstick SMALL (*)    Bilirubin Urine SMALL (*)    Ketones, ur TRACE (*)    Protein, ur 100 (*)    Leukocytes, UA TRACE (*)    All other components within normal limits  URINALYSIS, MICROSCOPIC (REFLEX) - Abnormal; Notable for the  following:    Bacteria, UA RARE (*)    Squamous Epithelial / LPF 0-5 (*)    All other components within normal limits  LIPASE, BLOOD    EKG  EKG Interpretation None       Radiology Ct Abdomen Pelvis W Contrast  Result Date: 06/24/2017 CLINICAL DATA:  Abdominal pain with distention and nausea EXAM: CT ABDOMEN AND PELVIS WITH CONTRAST TECHNIQUE: Multidetector CT imaging of the abdomen and pelvis was performed using the standard protocol following bolus administration of intravenous contrast. CONTRAST:  159mL ISOVUE-300 IOPAMIDOL (ISOVUE-300) INJECTION 61% COMPARISON:  02/07/2017 FINDINGS: Lower chest: Small right-sided pleural effusion. No acute consolidation at the lung bases. Normal heart size. Hepatobiliary: No focal hepatic abnormality. Surgical absence of the gallbladder. No biliary dilatation. Pancreas: Unremarkable. No pancreatic ductal dilatation or surrounding inflammatory changes. Spleen: Stable 1.8 cm low-density lesion in the posterior spleen since 2017. Tiny hypodensity anterior spleen, also unchanged. Adrenals/Urinary Tract: Adrenal glands are within normal limits. Kidneys show no hydronephrosis. The bladder is nearly empty. Stomach/Bowel: Stomach is nonenlarged. No dilated small bowel. No colon wall thickening. Appendix is not well seen but no right lower quadrant inflammation. Vascular/Lymphatic: Aortic atherosclerosis. No aneurysmal dilatation. Enlarging upper abdominal/retrocaval lymph node, now measuring 14 mm. Multiple small periaortic lymph nodes, slight increase in size compared to prior. Reproductive: Uterus is atrophic. Re- demonstrated 3.8 cm right adnexal cyst. Increasing soft tissue density in the left at adnexa, now measuring 2.9 cm, with possible posterior cystic component measuring 19 mm. Other: Negative for free air. Development of moderate abdominal and pelvic ascites. Diffuse nodular infiltration of the omentum and anterior mesentery. Musculoskeletal: Degenerative  changes of the spine, most notable at L4-L5 and L5-S1. Status post left femoral intramedullary rod. IMPRESSION: 1. Interim finding of moderate ascites within the abdomen and pelvis with additional finding of diffuse nodular infiltration of the omentum and anterior mesenteric fat, the appearance would be consistent with peritoneal carcinomatosis/metastatic disease. Increasing retroperitoneal and upper abdominal adenopathy. 2. Re- demonstrated 3.8 cm cyst in the right adnexa. Enlarging soft tissue density in the left adnexa now with  possible cystic component posteriorly. In light of the above findings, concern is for ovarian neoplasm. Correlation with pelvic ultrasound recommended. 3. Small right-sided pleural effusion, new since prior study 4. Stable hypodense splenic lesions since 2017. Electronically Signed   By: Donavan Foil M.D.   On: 06/24/2017 22:00    Procedures Procedures (including critical care time)  Medications Ordered in ED Medications  iopamidol (ISOVUE-300) 61 % injection 100 mL (100 mLs Intravenous Contrast Given 06/24/17 2128)     Initial Impression / Assessment and Plan / ED Course  I have reviewed the triage vital signs and the nursing notes.  Pertinent labs & imaging results that were available during my care of the patient were reviewed by me and considered in my medical decision making (see chart for details).     Patient CT scan shows what looks like peritoneal carcinomatosis and she will get an ultrasound of the pelvis tomorrow a.m. After the ultrasound the emergency department physician will try to make arrangements for follow-up with the appropriate M.D. The patient's family doctor is Dr. Willey Blade  Final Clinical Impressions(s) / ED Diagnoses   Final diagnoses:  Epigastric pain    New Prescriptions New Prescriptions   No medications on file     Milton Ferguson, MD 06/24/17 2247

## 2017-06-24 NOTE — ED Triage Notes (Signed)
Patient sent to ED for CT of stomach by Dr. Willey Blade. Abdominal pain and distention x 2 weeks. States nausea. Denies vomiting.

## 2017-06-25 ENCOUNTER — Ambulatory Visit (HOSPITAL_COMMUNITY)
Admission: RE | Admit: 2017-06-25 | Discharge: 2017-06-25 | Disposition: A | Discharge status: Home / Self Care | Payer: Medicare Other | Source: Ambulatory Visit | Attending: Emergency Medicine | Admitting: Emergency Medicine

## 2017-06-25 ENCOUNTER — Other Ambulatory Visit (HOSPITAL_COMMUNITY): Payer: Self-pay | Admitting: Emergency Medicine

## 2017-06-25 DIAGNOSIS — R188 Other ascites: Secondary | ICD-10-CM | POA: Insufficient documentation

## 2017-06-25 DIAGNOSIS — N839 Noninflammatory disorder of ovary, fallopian tube and broad ligament, unspecified: Secondary | ICD-10-CM | POA: Insufficient documentation

## 2017-06-25 DIAGNOSIS — N83201 Unspecified ovarian cyst, right side: Secondary | ICD-10-CM | POA: Insufficient documentation

## 2017-06-25 DIAGNOSIS — N838 Other noninflammatory disorders of ovary, fallopian tube and broad ligament: Secondary | ICD-10-CM

## 2017-06-30 ENCOUNTER — Observation Stay (HOSPITAL_COMMUNITY)
Admission: EM | Admit: 2017-06-30 | Discharge: 2017-07-01 | Disposition: A | Discharge status: Home / Self Care | Payer: Medicare Other | Attending: Internal Medicine | Admitting: Internal Medicine

## 2017-06-30 ENCOUNTER — Emergency Department (HOSPITAL_COMMUNITY): Payer: Medicare Other

## 2017-06-30 ENCOUNTER — Encounter (HOSPITAL_COMMUNITY): Payer: Self-pay | Admitting: Emergency Medicine

## 2017-06-30 DIAGNOSIS — I482 Chronic atrial fibrillation: Secondary | ICD-10-CM | POA: Diagnosis not present

## 2017-06-30 DIAGNOSIS — I739 Peripheral vascular disease, unspecified: Secondary | ICD-10-CM | POA: Diagnosis not present

## 2017-06-30 DIAGNOSIS — K219 Gastro-esophageal reflux disease without esophagitis: Secondary | ICD-10-CM | POA: Insufficient documentation

## 2017-06-30 DIAGNOSIS — E039 Hypothyroidism, unspecified: Secondary | ICD-10-CM | POA: Insufficient documentation

## 2017-06-30 DIAGNOSIS — I700 Atherosclerosis of aorta: Secondary | ICD-10-CM | POA: Insufficient documentation

## 2017-06-30 DIAGNOSIS — Z79899 Other long term (current) drug therapy: Secondary | ICD-10-CM | POA: Diagnosis not present

## 2017-06-30 DIAGNOSIS — R180 Malignant ascites: Secondary | ICD-10-CM | POA: Diagnosis present

## 2017-06-30 DIAGNOSIS — R1010 Upper abdominal pain, unspecified: Secondary | ICD-10-CM | POA: Diagnosis present

## 2017-06-30 DIAGNOSIS — R0602 Shortness of breath: Secondary | ICD-10-CM | POA: Diagnosis not present

## 2017-06-30 DIAGNOSIS — R188 Other ascites: Secondary | ICD-10-CM | POA: Diagnosis present

## 2017-06-30 DIAGNOSIS — J449 Chronic obstructive pulmonary disease, unspecified: Secondary | ICD-10-CM | POA: Insufficient documentation

## 2017-06-30 DIAGNOSIS — C786 Secondary malignant neoplasm of retroperitoneum and peritoneum: Principal | ICD-10-CM | POA: Insufficient documentation

## 2017-06-30 DIAGNOSIS — N83201 Unspecified ovarian cyst, right side: Secondary | ICD-10-CM

## 2017-06-30 DIAGNOSIS — Z87891 Personal history of nicotine dependence: Secondary | ICD-10-CM | POA: Insufficient documentation

## 2017-06-30 DIAGNOSIS — K52838 Other microscopic colitis: Secondary | ICD-10-CM

## 2017-06-30 DIAGNOSIS — Z7901 Long term (current) use of anticoagulants: Secondary | ICD-10-CM | POA: Diagnosis not present

## 2017-06-30 DIAGNOSIS — J90 Pleural effusion, not elsewhere classified: Secondary | ICD-10-CM | POA: Insufficient documentation

## 2017-06-30 DIAGNOSIS — R197 Diarrhea, unspecified: Secondary | ICD-10-CM

## 2017-06-30 DIAGNOSIS — M797 Fibromyalgia: Secondary | ICD-10-CM | POA: Insufficient documentation

## 2017-06-30 DIAGNOSIS — M1990 Unspecified osteoarthritis, unspecified site: Secondary | ICD-10-CM | POA: Insufficient documentation

## 2017-06-30 DIAGNOSIS — Z96652 Presence of left artificial knee joint: Secondary | ICD-10-CM | POA: Insufficient documentation

## 2017-06-30 DIAGNOSIS — F329 Major depressive disorder, single episode, unspecified: Secondary | ICD-10-CM | POA: Diagnosis not present

## 2017-06-30 DIAGNOSIS — Z8619 Personal history of other infectious and parasitic diseases: Secondary | ICD-10-CM | POA: Diagnosis not present

## 2017-06-30 LAB — COMPREHENSIVE METABOLIC PANEL
ALK PHOS: 42 U/L (ref 38–126)
ALT: 12 U/L — ABNORMAL LOW (ref 14–54)
ANION GAP: 11 (ref 5–15)
AST: 19 U/L (ref 15–41)
Albumin: 3 g/dL — ABNORMAL LOW (ref 3.5–5.0)
BUN: 10 mg/dL (ref 6–20)
CALCIUM: 8.1 mg/dL — AB (ref 8.9–10.3)
CO2: 22 mmol/L (ref 22–32)
Chloride: 100 mmol/L — ABNORMAL LOW (ref 101–111)
Creatinine, Ser: 0.79 mg/dL (ref 0.44–1.00)
GFR calc non Af Amer: >60 mL/min (ref >60)
Glucose, Bld: 103 mg/dL — ABNORMAL HIGH (ref 65–99)
Potassium: 3.8 mmol/L (ref 3.5–5.1)
SODIUM: 133 mmol/L — AB (ref 135–145)
TOTAL PROTEIN: 7.1 g/dL (ref 6.5–8.1)
Total Bilirubin: 0.8 mg/dL (ref 0.3–1.2)

## 2017-06-30 LAB — CBC
HCT: 42.9 % (ref 36.0–46.0)
HEMOGLOBIN: 14.4 g/dL (ref 12.0–15.0)
MCH: 28.7 pg (ref 26.0–34.0)
MCHC: 33.6 g/dL (ref 30.0–36.0)
MCV: 85.5 fL (ref 78.0–100.0)
Platelets: 401 10*3/uL — ABNORMAL HIGH (ref 150–400)
RBC: 5.02 MIL/uL (ref 3.87–5.11)
RDW: 14.5 % (ref 11.5–15.5)
WBC: 12.5 10*3/uL — ABNORMAL HIGH (ref 4.0–10.5)

## 2017-06-30 LAB — POCT I-STAT TROPONIN I: TROPONIN I, POC: 0 ng/mL (ref 0.00–0.08)

## 2017-06-30 LAB — URINALYSIS, ROUTINE W REFLEX MICROSCOPIC
Glucose, UA: 100 mg/dL — AB
Ketones, ur: 40 mg/dL — AB
Leukocytes, UA: NEGATIVE
NITRITE: NEGATIVE
Protein, ur: 100 mg/dL — AB
pH: 6 (ref 5.0–8.0)

## 2017-06-30 LAB — LIPASE, BLOOD: Lipase: 21 U/L (ref 11–51)

## 2017-06-30 MED ORDER — DIPHENOXYLATE-ATROPINE 2.5-0.025 MG PO TABS
1.0000 | ORAL_TABLET | Freq: Four times a day (QID) | ORAL | Status: DC | PRN
  Administered 2017-06-30 – 2017-07-01 (×2): 1 via ORAL
  Filled 2017-06-30 (×2): qty 1

## 2017-06-30 MED ORDER — ESCITALOPRAM OXALATE 10 MG PO TABS: 10.0000 mg | ORAL_TABLET | Freq: Every day | ORAL | Status: DC

## 2017-06-30 MED ORDER — DILTIAZEM HCL ER COATED BEADS 120 MG PO CP24
120.0000 mg | ORAL_CAPSULE | Freq: Every day | ORAL | Status: DC
  Administered 2017-06-30: 120 mg via ORAL
  Filled 2017-06-30 (×2): qty 1

## 2017-06-30 MED ORDER — ALBUTEROL SULFATE HFA 108 (90 BASE) MCG/ACT IN AERS
2.0000 | INHALATION_SPRAY | Freq: Four times a day (QID) | RESPIRATORY_TRACT | Status: DC | PRN
Start: 2017-06-30 — End: 2017-06-30

## 2017-06-30 MED ORDER — HYDROMORPHONE HCL 2 MG PO TABS: 4.0000 mg | ORAL_TABLET | Freq: Three times a day (TID) | ORAL | Status: DC

## 2017-06-30 MED ORDER — LEVOTHYROXINE SODIUM 137 MCG PO TABS
137.0000 ug | ORAL_TABLET | Freq: Every day | ORAL | Status: DC
  Administered 2017-07-01: 137 ug via ORAL
  Filled 2017-06-30: qty 1

## 2017-06-30 MED ORDER — FLUTICASONE PROPIONATE 50 MCG/ACT NA SUSP
2.0000 | Freq: Two times a day (BID) | NASAL | Status: DC
  Administered 2017-06-30 – 2017-07-01 (×2): 2 via NASAL
  Filled 2017-06-30: qty 16

## 2017-06-30 MED ORDER — CHOLESTYRAMINE 4 G PO PACK
4.0000 g | PACK | Freq: Two times a day (BID) | ORAL | Status: DC
  Filled 2017-06-30: qty 1

## 2017-06-30 MED ORDER — KETOROLAC TROMETHAMINE 15 MG/ML IJ SOLN: 15.0000 mg | Freq: Four times a day (QID) | INTRAMUSCULAR | Status: DC | PRN

## 2017-06-30 MED ORDER — ACETAMINOPHEN 650 MG RE SUPP: 650.0000 mg | Freq: Four times a day (QID) | RECTAL | Status: DC | PRN

## 2017-06-30 MED ORDER — ALPRAZOLAM 0.25 MG PO TABS
0.2500 mg | ORAL_TABLET | Freq: Every evening | ORAL | Status: DC | PRN
  Administered 2017-07-01: 0.25 mg via ORAL
  Filled 2017-06-30: qty 1

## 2017-06-30 MED ORDER — ALBUTEROL SULFATE (2.5 MG/3ML) 0.083% IN NEBU: 2.5000 mg | INHALATION_SOLUTION | Freq: Four times a day (QID) | RESPIRATORY_TRACT | Status: DC | PRN

## 2017-06-30 MED ORDER — ACETAMINOPHEN 325 MG PO TABS: 650.0000 mg | ORAL_TABLET | Freq: Four times a day (QID) | ORAL | Status: DC | PRN

## 2017-06-30 MED ORDER — AZELASTINE HCL 0.1 % NA SOLN
2.0000 | Freq: Two times a day (BID) | NASAL | Status: DC
  Administered 2017-06-30 – 2017-07-01 (×2): 2 via NASAL
  Filled 2017-06-30: qty 30

## 2017-06-30 MED ORDER — SODIUM CHLORIDE 0.9% FLUSH
3.0000 mL | Freq: Two times a day (BID) | INTRAVENOUS | Status: DC
  Administered 2017-06-30 – 2017-07-01 (×2): 3 mL via INTRAVENOUS

## 2017-06-30 MED ORDER — MOMETASONE FURO-FORMOTEROL FUM 200-5 MCG/ACT IN AERO
2.0000 | INHALATION_SPRAY | Freq: Two times a day (BID) | RESPIRATORY_TRACT | Status: DC
  Administered 2017-07-01: 2 via RESPIRATORY_TRACT
  Filled 2017-06-30: qty 8.8

## 2017-06-30 NOTE — H&P (Addendum)
History and Physical    [name] EXH:[id number] DOB: 14-Dec-1932 DOA: 06/30/2017  PCP: Asencion Noble, MD  Patient coming from: Home  Chief Complaint: Short of breath, abdominal distention  HPI: [name] is a 83 y.o. female with medical history significant of ovarian cyst followed by coronal, COPD, fibromyalgia, hypothyroidism, paroxysmal atrial fibrillation on chronic anticoagulation who initially presented to the emergency department on 06/24/2017 with increased abdominal distention. Abdominal imaging was notable for new large volume ascites with a 2.9 cm simple appearing cyst in the right ovary. Left ovary appeared grossly unremarkable. On CT, there was enlarging soft tissue density in the left adnexa with possible cystic component. There were concerns for possible ovarian neoplasm. There were also stable hypodense splenic lesions noted.  ED Course: In emergency department, patient noted to have continued abdominal distention. O2 sats remained stable on room air. Given symptomatic nature of patient's abdominal distention, hospital service consulted for consideration for admission.  Review of Systems:  Review of Systems  Constitutional: Negative for chills, diaphoresis, fever and weight loss.  HENT: Negative for congestion, ear discharge, ear pain and tinnitus.   Eyes: Negative for double vision, photophobia and pain.  Respiratory: Positive for shortness of breath. Negative for hemoptysis and sputum production.   Cardiovascular: Negative for palpitations, orthopnea and claudication.  Gastrointestinal: Positive for abdominal pain. Negative for blood in stool, nausea and vomiting.  Genitourinary: Negative for flank pain, frequency and hematuria.  Musculoskeletal: Negative for back pain, joint pain and neck pain.  Neurological: Negative for tingling, tremors, sensory change and loss of consciousness.  Psychiatric/Behavioral: Negative for hallucinations. The patient is not  nervous/anxious and does not have insomnia.     Past Medical History:  Diagnosis Date  . COPD (chronic obstructive pulmonary disease) ([place])   . Depression   . Diverticulosis   . Family hx of colon cancer   . Fibromyalgia   . GERD (gastroesophageal reflux disease)   . Hemorrhoids   . Hiatal hernia   . History of shingles   . Hypothyroidism   . IBS (irritable bowel syndrome)   . Lymphocytic colitis   . Osteoporosis   . Paroxysmal atrial fibrillation ([facility])   . Psoriatic arthritis ([place])   . Rectal polyp   . Schatzki's ring   . Uterine polyp     Past Surgical History:  Procedure Laterality Date  . CHOLECYSTECTOMY  Dec 04, 2010  . FEMUR IM NAIL Left 12/11/2013   Procedure: INTRAMEDULLARY (IM) NAIL FEMORAL;  Surgeon: [name], MD;  Location: [facility];  Service: Orthopedics;  Laterality: Left;  . FRACTURE SURGERY Left 12/11/2013   hip  . KNEE SURGERY Left    x 2  . TOTAL KNEE REVISION  08/06/2012   Procedure: TOTAL KNEE REVISION;  Surgeon: [name], MD;  Location: [facility];  Service: Orthopedics;  Laterality: Left;  Left Total Knee Arthroplasty Revision  . Uterine polypectomy       reports that she quit smoking about 36 years ago. Her smoking use included Cigarettes. She has a 20.00 pack-year smoking history. She has never used smokeless tobacco. She reports that she drinks alcohol. She reports that she does not use drugs.  Allergies  Allergen Reactions  . Codeine Itching    Family History  Problem Relation Age of Onset  . Heart disease Father        pacemaker  . Lung cancer Father        hx of smoker  . Colon cancer  Mother        dx in her mid 37's  . Colon cancer Maternal Grandfather   . Depression Sister     Prior to Admission medications   Medication Sig Start Date End Date Taking? Authorizing Provider  albuterol (PROAIR HFA) 108 (90 Base) MCG/ACT inhaler Inhale 2 puffs into the lungs every 6 (six) hours as needed for wheezing or shortness of breath.  06/19/17  Yes Collene Gobble, MD  ALPRAZolam Duanne Moron) 0.25 MG tablet Take 0.25 mg by mouth at bedtime as needed for anxiety.   Yes [provider]  azelastine (ASTELIN) 0.1 % nasal spray Place 2 sprays into both nostrils 2 (two) times daily. Use in each nostril as directed   Yes [provider]  budesonide-formoterol (SYMBICORT) 160-4.5 MCG/ACT inhaler Inhale 2 puffs into the lungs 2 (two) times daily.   Yes [provider]  Cholecalciferol (VITAMIN D) 2000 units CAPS Take 2,000 Units by mouth daily.   Yes [provider]  diltiazem (CARTIA XT) 120 MG 24 hr capsule TAKE 1 CAPSULE BY MOUTH EVERY DAY 05/03/17  Yes Satira Sark, MD  diphenoxylate-atropine (LOMOTIL) 2.5-0.025 MG tablet Take 1 tablet by mouth 4 (four) times daily. Patient taking differently: Take 1 tablet by mouth 4 (four) times daily as needed for diarrhea or loose stools.  08/21/16  Yes [name], MD  fluticasone [facility]) 50 MCG/ACT nasal spray Place 2 sprays into both nostrils 2 (two) times daily. For nasal congestion 11/20/13  Yes [provider]  folic acid (FOLVITE) 1 MG tablet Take 1 mg by mouth daily.   Yes [provider]  HYDROmorphone (DILAUDID) 4 MG tablet Take 4 mg by mouth 3 (three) times daily.   Yes [provider]  levothyroxine (SYNTHROID, LEVOTHROID) 137 MCG tablet Take 137 mcg by mouth every morning. For thyroid therapy   Yes [provider]  MAGNESIUM GLYCINATE PLUS PO Take 400 mg by mouth daily.   Yes [provider]  methotrexate (50 MG/ML) 1 g injection Inject 250 mg into the vein. Every Wednesday   Yes [provider]  methylcellulose (ARTIFICIAL TEARS) 1 % ophthalmic solution Place 1 drop into both eyes 2 (two) times daily as needed. Dry eyes   Yes [provider]  XARELTO 20 MG TABS tablet TAKE 1 TABLET BY MOUTH EVERY DAY WITH SUPPER 04/23/17  Yes Satira Sark, MD  escitalopram (LEXAPRO) 10 MG tablet  Take 10 mg by mouth daily.    [provider]    Physical Exam: Vitals:   06/30/17 0921 06/30/17 1335 06/30/17 1550  BP: 129/76 120/65 135/65  Pulse: (!) 104 100 (!) 101  Resp: 16 16 16   Temp: 98.3 F (36.8 C)    SpO2: 95% 97% 96%    Constitutional: NAD, calm, comfortable Vitals:   06/30/17 0921 06/30/17 1335 06/30/17 1550  BP: 129/76 120/65 135/65  Pulse: (!) 104 100 (!) 101  Resp: 16 16 16   Temp: 98.3 F (36.8 C)    SpO2: 95% 97% 96%   Eyes: PERRL, lids and conjunctivae normal ENMT: Mucous membranes are moist. Posterior pharynx clear of any exudate or lesions.Normal dentition.  Neck: normal, supple, no masses, no thyromegaly Respiratory: clear to auscultation bilaterally, no wheezing, no crackles. Normal respiratory effort. No accessory muscle use.  Cardiovascular: Regular rhythm, tachycardic, s1, s2 Abdomen: ascitic, distended, pos BS Musculoskeletal: no clubbing / cyanosis. No joint deformity upper and lower extremities. Good ROM, no contractures. Normal muscle tone.  Skin: no rashes, lesions, ulcers. No induration Neurologic: CN 2-12 grossly intact. Sensation intact, Strength 5/5 in all 4.  Psychiatric: Normal judgment and insight. Alert and oriented x 3. Normal mood.    Labs on Admission: I have personally reviewed following labs and imaging studies  CBC:  Recent Labs Lab 06/24/17 1955 06/30/17 1235  WBC 9.7 12.5*  HGB 14.4 14.4  HCT 43.9 42.9  MCV 87.6 85.5  PLT 445* 945*   Basic Metabolic Panel:  Recent Labs Lab 06/24/17 1955 06/30/17 1235  NA 138 133*  K 4.4 3.8  CL 103 100*  CO2 27 22  GLUCOSE 135* 103*  BUN 8 10  CREATININE 0.75 0.79  CALCIUM 8.5* 8.1*   GFR: Estimated Creatinine Clearance: 55.7 mL/min (by C-G formula based on SCr of 0.79 mg/dL). Liver Function Tests:  Recent Labs Lab 06/24/17 1955 06/30/17 1235  AST 18 19  ALT 11* 12*  ALKPHOS 41 42  BILITOT 0.6 0.8  PROT 7.2 7.1  ALBUMIN 3.1* 3.0*    Recent  Labs Lab 06/24/17 1955 06/30/17 1235  LIPASE 32 21   No results for input(s): AMMONIA in the last 168 hours. Coagulation Profile: No results for input(s): INR, PROTIME in the last 168 hours. Cardiac Enzymes: No results for input(s): CKTOTAL, CKMB, CKMBINDEX, TROPONINI in the last 168 hours. BNP (last 3 results) No results for input(s): PROBNP in the last 8760 hours. HbA1C: No results for input(s): HGBA1C in the last 72 hours. CBG: No results for input(s): GLUCAP in the last 168 hours. Lipid Profile: No results for input(s): CHOL, HDL, LDLCALC, TRIG, CHOLHDL, LDLDIRECT in the last 72 hours. Thyroid Function Tests: No results for input(s): TSH, T4TOTAL, FREET4, T3FREE, THYROIDAB in the last 72 hours. Anemia Panel: No results for input(s): VITAMINB12, FOLATE, FERRITIN, TIBC, IRON, RETICCTPCT in the last 72 hours. Urine analysis:    Component Value Date/Time   COLORURINE YELLOW 06/30/2017 1235   APPEARANCEUR HAZY (A) 06/30/2017 1235   LABSPEC >1.030 (H) 06/30/2017 1235   PHURINE 6.0 06/30/2017 1235   GLUCOSEU 100 (A) 06/30/2017 1235   HGBUR MODERATE (A) 06/30/2017 1235   BILIRUBINUR MODERATE (A) 06/30/2017 1235   KETONESUR 40 (A) 06/30/2017 1235   PROTEINUR 100 (A) 06/30/2017 1235   UROBILINOGEN 0.2 12/08/2013 1845   NITRITE NEGATIVE 06/30/2017 1235   LEUKOCYTESUR NEGATIVE 06/30/2017 1235   Sepsis Labs: !!!!!!!!!!!!!!!!!!!!!!!!!!!!!!!!!!!!!!!!!!!! @LABRCNTIP (procalcitonin:4,lacticidven:4) )No results found for this or any previous visit (from the past 240 hour(s)).   Radiological Exams on Admission: Dg Chest 2 View  Result Date: 06/30/2017 CLINICAL DATA:  83 year old female with shortness of breath, vomiting and abdominal distention for 1 week. Peritoneal carcinomatosis diagnosed recently on CT. EXAM: CHEST  2 VIEW COMPARISON:  CT Abdomen and Pelvis 06/24/2017 and earlier. FINDINGS: Upright AP and lateral views of the chest. Small bilateral pleural effusions. No pneumothorax  or pulmonary edema. No pulmonary nodule or consolidation. Normal cardiac size and mediastinal contours. Calcified aortic atherosclerosis. Osteopenia. No acute osseous abnormality identified. No pneumoperitoneum identified. Visible bowel gas pattern is stable from the recent CT. IMPRESSION: 1. Small pleural effusions probably not significantly changed from the recent CT 06/24/2017. No other acute cardiopulmonary abnormality. 2. Stable visible bowel gas pattern.  No abdominal free air. Electronically Signed   By: Genevie Ann M.D.   On: 06/30/2017 10:20    EKG: Independently reviewed. Sinus tach  Assessment/Plan Principal Problem:   Ascites Active Problems:   Upper abdominal pain   Atrial fibrillation (HCC)   Right ovarian  cyst   1. Ascites 1. Noted on CT from 9/17 2. Chart reviewed. Patient known to Gyn-Onc, Dr. Denman George, for hx of ovarian cyst 3. Patient reason symptomatic with subjective shortness of breath. Currently on minimal O2 support. 4. Will request ultrasound guided paracentesis. Send fluid for cytology, cell count, culture. 2. Abd pain 1. Suspect secondary to above ascites 2. Continue with analgesics as needed 3. SOB 1. Subjective shortness of breath noted, patient with appropriate O2 sats on minimal O2 support. 2. Anticipate improvement in SOB of breath with paracentesis. 4. Afib, chronic 1. Presently stable, heart rate in the low 100s. 2. We'll continue patient on telemetry, continue home medications. 3. Will add PRN labetalol with hold parameters 4. We'll continues her alto per home regimen 5. Hold xarelto given plans for paracentesis 5. Chronic anticoagulation 1. Per above, will plan to hold xarelto pending paracentesis 2. Would resume anticoagulation when OK with radiology 6. Hx ovarian cyst 1. Followed by gynecology as per above. Recommend close follow-up with Gyn-Onc on hospital discharge.  DVT prophylaxis: SCD's Code Status: Full Family Communication: Pt in room   Disposition Plan: Possible d/c home in 24-48hrs  Consults called:  Admission status: Observation status as will likely require less than 2 midnight stay to tap pateint's ascites   [name], Orpah Melter MD [facility] Pager [phone]  If 7PM-7AM, please contact night-coverage [email or web address] Password [facility]  06/30/2017, 4:29 PM

## 2017-06-30 NOTE — ED Notes (Signed)
Call report to Maudie Mercury [phone]@ [phone].

## 2017-06-30 NOTE — ED Provider Notes (Signed)
[facility] DEPT Provider Note   CSN: [id number] Arrival date & time: 06/30/17  0849     History   Chief Complaint Chief Complaint  Patient presents with  . Shortness of Breath  . Emesis    HPI [name] is a 83 y.o. female with a history of COPD, microscopic colitis, , and right ovarian cysts presents today for gradually worsening shortness of breath, and abdominal swelling. She was seen on 06/24/17 at [facility] for epigastric pain. CT was obtained which shows diffuse nodular infiltration of the omentum and anterior mesentery, multiple small periaortic lymph nodes, and enlarging upper abdominal/retrocaval lymph node, "consistent with peritoneal carcinomatosis/metastatic disease."  She reports that over the past two days she has had a significantly worsened condition, feels short of breath with any exertion, feels like her abdomen is "pushing up" on her chest. She reports dry heaving last night and today, feels like she can't drink water with out getting nauseous.    She has a cyst in the right adnexa that has been present and appears unchanged on imaging since 2012.  She has previously seen [facility] Dr. Denman George who reports cyst is stable as of 02/07/17 and recommended no further monitoring.  She had a pelvic ultrasound which showed a simple cyst on the right ovary on 06/25/17.        HPI  Past Medical History:  Diagnosis Date  . COPD (chronic obstructive pulmonary disease) ([place])   . Depression   . Diverticulosis   . Family hx of colon cancer   . Fibromyalgia   . GERD (gastroesophageal reflux disease)   . Hemorrhoids   . Hiatal hernia   . History of shingles   . Hypothyroidism   . IBS (irritable bowel syndrome)   . Lymphocytic colitis   . Osteoporosis   . Paroxysmal atrial fibrillation ([facility])   . Psoriatic arthritis ([place])   . Rectal polyp   . Schatzki's ring   . Uterine polyp     Patient Active Problem List   Diagnosis Date Noted  . Right ovarian cyst 01/18/2017    . Elevated CA-125 01/18/2017  . Depression 12/23/2015  . Peripheral edema 08/23/2015  . Fracture of hip, left, closed ([place]) 12/08/2013  . Hip fracture ([place]) 12/08/2013  . PAD (peripheral artery disease) ([place]) 11/25/2013  . Encounter for therapeutic drug monitoring 11/09/2013  . Atrial fibrillation ([place]) 05/07/2013  . Cough 03/20/2013  . Total knee replacement status 10/07/2012  . Knee pain 10/07/2012  . Knee stiffness 10/07/2012  . Tachycardia 09/17/2012  . Chest pain 09/17/2012  . Difficulty in walking(719.7) 09/16/2012  . Muscle weakness (generalized) 09/16/2012  . Postop Acute blood loss anemia 08/08/2012  . Instability of prosthetic knee ([place]) 08/06/2012  . Bloating 02/14/2012  . Upper abdominal pain 02/14/2012  . Allergic rhinitis, seasonal 02/15/2011  . COLITIS 02/20/2010  . DIARRHEA 01/02/2010  . ABDOMINAL PAIN -GENERALIZED 01/02/2010  . PERSONAL HX COLONIC POLYPS 01/02/2010  . HYPOTHYROIDISM 12/28/2009  . COPD (chronic obstructive pulmonary disease) ([place]) 12/28/2009  . ARTHRITIS 12/28/2009    Past Surgical History:  Procedure Laterality Date  . CHOLECYSTECTOMY  Dec 04, 2010  . FEMUR IM NAIL Left 12/11/2013   Procedure: INTRAMEDULLARY (IM) NAIL FEMORAL;  Surgeon: [name], MD;  Location: [facility] ORS;  Service: Orthopedics;  Laterality: Left;  . FRACTURE SURGERY Left 12/11/2013   hip  . KNEE SURGERY Left    x 2  . TOTAL KNEE REVISION  08/06/2012   Procedure: TOTAL KNEE  REVISION;  Surgeon: [name], MD;  Location: [facility] ORS;  Service: Orthopedics;  Laterality: Left;  Left Total Knee Arthroplasty Revision  . Uterine polypectomy      OB History    No data available       Home Medications    Prior to Admission medications   Medication Sig Start Date End Date Taking? Authorizing Provider  albuterol (PROAIR HFA) 108 (90 Base) MCG/ACT inhaler Inhale 2 puffs into the lungs every 6 (six) hours as needed for wheezing or shortness of breath. 06/19/17  Yes Collene Gobble, MD  ALPRAZolam Duanne Moron) 0.25 MG tablet Take 0.25 mg by mouth at bedtime as needed for anxiety.   Yes [provider]  azelastine (ASTELIN) 0.1 % nasal spray Place 2 sprays into both nostrils 2 (two) times daily. Use in each nostril as directed   Yes [provider]  budesonide-formoterol (SYMBICORT) 160-4.5 MCG/ACT inhaler Inhale 2 puffs into the lungs 2 (two) times daily.   Yes [provider]  Cholecalciferol (VITAMIN D) 2000 units CAPS Take 2,000 Units by mouth daily.   Yes [provider]  diltiazem (CARTIA XT) 120 MG 24 hr capsule TAKE 1 CAPSULE BY MOUTH EVERY DAY 05/03/17  Yes Satira Sark, MD  diphenoxylate-atropine (LOMOTIL) 2.5-0.025 MG tablet Take 1 tablet by mouth 4 (four) times daily. Patient taking differently: Take 1 tablet by mouth 4 (four) times daily as needed for diarrhea or loose stools.  08/21/16  Yes [name], MD  fluticasone [facility]) 50 MCG/ACT nasal spray Place 2 sprays into both nostrils 2 (two) times daily. For nasal congestion 11/20/13  Yes [provider]  folic acid (FOLVITE) 1 MG tablet Take 1 mg by mouth daily.   Yes [provider]  HYDROmorphone (DILAUDID) 4 MG tablet Take 4 mg by mouth 3 (three) times daily.   Yes [provider]  levothyroxine (SYNTHROID, LEVOTHROID) 137 MCG tablet Take 137 mcg by mouth every morning. For thyroid therapy   Yes [provider]  MAGNESIUM GLYCINATE PLUS PO Take 400 mg by mouth daily.   Yes [provider]  methotrexate (50 MG/ML) 1 g injection Inject 250 mg into the vein. Every Wednesday   Yes [provider]  methylcellulose (ARTIFICIAL TEARS) 1 % ophthalmic solution Place 1 drop into both eyes 2 (two) times daily as needed. Dry eyes   Yes [provider]  XARELTO 20 MG TABS tablet TAKE 1 TABLET BY MOUTH EVERY DAY WITH SUPPER 04/23/17  Yes Satira Sark, MD  escitalopram (LEXAPRO) 10 MG tablet Take 10 mg by mouth  daily.    [provider]    Family History Family History  Problem Relation Age of Onset  . Heart disease Father        pacemaker  . Lung cancer Father        hx of smoker  . Colon cancer Mother        dx in her mid 68's  . Colon cancer Maternal Grandfather   . Depression Sister     Social History Social History  Substance Use Topics  . Smoking status: Former Smoker    Packs/day: 1.00    Years: 20.00    Types: Cigarettes    Quit date: 10/08/1980  . Smokeless tobacco: Never Used  . Alcohol use Yes     Comment: rarely, 12-23-15 rarely     Allergies   Codeine   Review of Systems Review of Systems  Constitutional:  Positive for appetite change, fatigue and unexpected weight change (elevated). Negative for chills and fever.  HENT: Negative for ear pain and sore throat.   Eyes: Negative for pain and visual disturbance.  Respiratory: Positive for shortness of breath. Negative for cough.   Cardiovascular: Negative for chest pain and palpitations.  Gastrointestinal: Positive for abdominal distention, abdominal pain and nausea. Negative for constipation, diarrhea and vomiting (Dry heaving).  Genitourinary: Negative for difficulty urinating, dysuria, frequency, hematuria, pelvic pain, vaginal bleeding, vaginal discharge and vaginal pain.  Musculoskeletal: Negative for arthralgias, back pain and neck pain.  Skin: Negative for color change and rash.  Neurological: Negative for seizures, syncope and headaches.  All other systems reviewed and are negative.    Physical Exam Updated Vital Signs BP 135/65 (BP Location: Right Arm)   Pulse (!) 101   Temp 98.3 F (36.8 C)   Resp 16   SpO2 96%   Physical Exam  Constitutional: She is oriented to person, place, and time. She appears well-developed and well-nourished. No distress.  HENT:  Head: Normocephalic and atraumatic.  Mouth/Throat: Oropharynx is clear and moist. No oropharyngeal exudate.  Eyes: Pupils are equal,  round, and reactive to light. Conjunctivae are normal. Right eye exhibits no discharge. Left eye exhibits no discharge. No scleral icterus.  Neck: Normal range of motion. Neck supple. No JVD present.  Cardiovascular: Normal rate, regular rhythm, normal heart sounds and intact distal pulses.  Exam reveals no friction rub.   No murmur heard. Pulmonary/Chest: Effort normal and breath sounds normal. No stridor. No respiratory distress. She has no wheezes. She has no rales.  Abdominal: Soft. Bowel sounds are normal. She exhibits distension and ascites. There is generalized tenderness. There is no rigidity and no guarding.  Musculoskeletal: She exhibits no edema or deformity.  Neurological: She is alert and oriented to person, place, and time. She exhibits normal muscle tone.  Skin: Skin is warm and dry. She is not diaphoretic.  Psychiatric: She has a normal mood and affect. Her behavior is normal.  Nursing note and vitals reviewed.    ED Treatments / Results  Labs (all labs ordered are listed, but only abnormal results are displayed) Labs Reviewed  COMPREHENSIVE METABOLIC PANEL - Abnormal; Notable for the following:       Result Value   Sodium 133 (*)    Chloride 100 (*)    Glucose, Bld 103 (*)    Calcium 8.1 (*)    Albumin 3.0 (*)    ALT 12 (*)    All other components within normal limits  CBC - Abnormal; Notable for the following:    WBC 12.5 (*)    Platelets 401 (*)    All other components within normal limits  URINALYSIS, ROUTINE W REFLEX MICROSCOPIC - Abnormal; Notable for the following:    APPearance HAZY (*)    Specific Gravity, Urine >1.030 (*)    Glucose, UA 100 (*)    Hgb urine dipstick MODERATE (*)    Bilirubin Urine MODERATE (*)    Ketones, ur 40 (*)    Protein, ur 100 (*)    All other components within normal limits  LIPASE, BLOOD  COMPREHENSIVE METABOLIC PANEL  CBC  I-STAT TROPONIN, ED  POCT I-STAT TROPONIN I  I-STAT TROPONIN, ED    EKG  EKG  Interpretation  Date/Time:  Sunday June 30 2017 09:27:46 EDT Ventricular Rate:  106 PR Interval:    QRS Duration: 88 QT Interval:  327 QTC Calculation: 435 R Axis:  134 Text Interpretation:  Sinus tachycardia Low voltage with right axis deviation Baseline wander in lead(s) II III aVR aVF V2 SINCE LAST TRACING HEART RATE HAS INCREASED Confirmed by Orlie Dakin [phone]) on 06/30/2017 4:11:30 PM       Radiology Dg Chest 2 View  Result Date: 06/30/2017 CLINICAL DATA:  83 year old female with shortness of breath, vomiting and abdominal distention for 1 week. Peritoneal carcinomatosis diagnosed recently on CT. EXAM: CHEST  2 VIEW COMPARISON:  CT Abdomen and Pelvis 06/24/2017 and earlier. FINDINGS: Upright AP and lateral views of the chest. Small bilateral pleural effusions. No pneumothorax or pulmonary edema. No pulmonary nodule or consolidation. Normal cardiac size and mediastinal contours. Calcified aortic atherosclerosis. Osteopenia. No acute osseous abnormality identified. No pneumoperitoneum identified. Visible bowel gas pattern is stable from the recent CT. IMPRESSION: 1. Small pleural effusions probably not significantly changed from the recent CT 06/24/2017. No other acute cardiopulmonary abnormality. 2. Stable visible bowel gas pattern.  No abdominal free air. Electronically Signed   By: Genevie Ann M.D.   On: 06/30/2017 10:20    Procedures Procedures (including critical care time)  Medications Ordered in ED Medications - No data to display   Initial Impression / Assessment and Plan / ED Course  I have reviewed the triage vital signs and the nursing notes.  Pertinent labs & imaging results that were available during my care of the patient were reviewed by me and considered in my medical decision making (see chart for details).  Clinical Course as of Jun 30 2324  Sun Jun 30, 2017  1618 Spoke with Dr. Clementeen Graham who will come see the patient and admit.   [EH]    Clinical Course  User Index [EH] Ollen Gross   Foy [place] Matney presents for evaluation of worsening ascites with shortness of breath. She has recently been worked up for abdominal bloating and found to have CT findings consistent with a malignant process.  She has not been having signs and symptoms of constitutional infection.  She is currently unable to get around at home secondary to shortness of breath.  Other causes for her SOB were considered, low suspicion for ACS given normal trop, low suspicion for pneumonia based on CXR.  I feel that the most likely cause of her SOB is secondary to the large amount of ascites present on physical exam and documented on imaging recently.  I feel that patient needs an admission for work up of her new onset ascites, possible malignant process, and a diagnositc and therapeutic paracentesis.   This patient was seen as a shared visit with Dr. Winfred Leeds who evaluated the patient and agreed with my plan.  I consulted hospitalist for admission who agreed to admit patient.    The patient appears reasonably stabilized for admission considering the current resources, flow, and capabilities available in the ED at this time, and I doubt any other [facility] requiring further screening and/or treatment in the ED prior to admission.   Final Clinical Impressions(s) / ED Diagnoses   Final diagnoses:  Shortness of breath  Other ascites    New Prescriptions New Prescriptions   No medications on file     Ollen Gross 06/30/17 2326    Orlie Dakin, MD 07/01/17 1356

## 2017-06-30 NOTE — ED Provider Notes (Signed)
Patient with progressively worsening shortness of breath over the past 5 days. Also with vague abdominal discomfort. He had CT scan of abdomen 06/24/2017 consistent with moderate ascites concern for neoplasm. Patient presently in no respiratory distress. Abdomen is distended, nontender   Orlie Dakin, MD 06/30/17 1610

## 2017-06-30 NOTE — ED Triage Notes (Signed)
Pt reports she has had SOB, emesis, and abd distention for the past week. Pt was seen at an ED several days ago and had CT done which showed tumors.  Pt reports she is feeling worse since last visit. Pt reports she feels SOB due to abd distention.

## 2017-07-01 ENCOUNTER — Observation Stay (HOSPITAL_COMMUNITY): Payer: Medicare Other

## 2017-07-01 DIAGNOSIS — R180 Malignant ascites: Secondary | ICD-10-CM

## 2017-07-01 DIAGNOSIS — I482 Chronic atrial fibrillation: Secondary | ICD-10-CM

## 2017-07-01 DIAGNOSIS — C786 Secondary malignant neoplasm of retroperitoneum and peritoneum: Secondary | ICD-10-CM | POA: Diagnosis not present

## 2017-07-01 LAB — BODY FLUID CELL COUNT WITH DIFFERENTIAL
Eos, Fluid: 0 %
Lymphs, Fluid: 62 %
Monocyte-Macrophage-Serous Fluid: 30 % — ABNORMAL LOW (ref 50–90)
NEUTROPHIL FLUID: 8 % (ref 0–25)
WBC FLUID: 1752 uL — AB (ref 0–1000)

## 2017-07-01 LAB — COMPREHENSIVE METABOLIC PANEL
ALBUMIN: 2.4 g/dL — AB (ref 3.5–5.0)
ALT: 11 U/L — AB (ref 14–54)
AST: 20 U/L (ref 15–41)
Alkaline Phosphatase: 34 U/L — ABNORMAL LOW (ref 38–126)
Anion gap: 8 (ref 5–15)
BILIRUBIN TOTAL: 0.9 mg/dL (ref 0.3–1.2)
BUN: 9 mg/dL (ref 6–20)
CHLORIDE: 100 mmol/L — AB (ref 101–111)
CO2: 25 mmol/L (ref 22–32)
Calcium: 7.6 mg/dL — ABNORMAL LOW (ref 8.9–10.3)
Creatinine, Ser: 0.74 mg/dL (ref 0.44–1.00)
GFR calc Af Amer: >60 mL/min (ref >60)
GFR calc non Af Amer: >60 mL/min (ref >60)
GLUCOSE: 93 mg/dL (ref 65–99)
POTASSIUM: 3.9 mmol/L (ref 3.5–5.1)
Sodium: 133 mmol/L — ABNORMAL LOW (ref 135–145)
TOTAL PROTEIN: 5.9 g/dL — AB (ref 6.5–8.1)

## 2017-07-01 LAB — GRAM STAIN

## 2017-07-01 LAB — CBC
HEMATOCRIT: 38.1 % (ref 36.0–46.0)
Hemoglobin: 12.9 g/dL (ref 12.0–15.0)
MCH: 28.4 pg (ref 26.0–34.0)
MCHC: 33.9 g/dL (ref 30.0–36.0)
MCV: 83.9 fL (ref 78.0–100.0)
Platelets: 336 10*3/uL (ref 150–400)
RBC: 4.54 MIL/uL (ref 3.87–5.11)
RDW: 14.5 % (ref 11.5–15.5)
WBC: 10 10*3/uL (ref 4.0–10.5)

## 2017-07-01 LAB — ALBUMIN, PLEURAL OR PERITONEAL FLUID: Albumin, Fluid: 2.2 g/dL

## 2017-07-01 MED ORDER — LIDOCAINE HCL 2 % IJ SOLN
INTRAMUSCULAR | Status: AC
  Filled 2017-07-01: qty 10

## 2017-07-01 MED ORDER — FUROSEMIDE 20 MG PO TABS: 20.0000 mg | ORAL_TABLET | ORAL | 0 refills | Status: DC

## 2017-07-01 NOTE — Procedures (Signed)
Ultrasound-guided diagnostic and therapeutic paracentesis performed yielding 2.5 liters of turbid, light brown fluid. No immediate complications. A portion of the fluid was submitted to the lab for preordered studies.

## 2017-07-01 NOTE — Discharge Summary (Signed)
Physician Discharge Summary  [name] NWG:[id number] DOB: [birth year] DOA: 06/30/2017  PCP: Asencion Noble, MD  Admit date: 06/30/2017 Discharge date: 07/01/2017  Time spent: >35 minutes  Recommendations for Outpatient Follow-up:  GYN oncology next week PCP in 3-5 days   Discharge Diagnoses:  Principal Problem:   Ascites Active Problems:   Upper abdominal pain   Atrial fibrillation (HCC)   Right ovarian cyst   Ascites, malignant   Discharge Condition: stable   Diet recommendation: low sodium   Filed Weights   06/30/17 1820  Weight: 82.3 kg (181 lb 7 oz)    History of present illness:  83 y.o. female with medical history significant of ovarian cyst followed by coronal, COPD, fibromyalgia, hypothyroidism, paroxysmal atrial fibrillation on chronic anticoagulation who initially presented to the emergency department on 06/24/2017 with increased abdominal distention. Abdominal imaging was notable for new large volume ascites with a 2.9 cm simple appearing cyst in the right ovary. Left ovary appeared grossly unremarkable. On CT, there was enlarging soft tissue density in the left adnexa with possible cystic component. There were concerns for possible ovarian neoplasm. There were also stable hypodense splenic lesions noted.  ED Course: In emergency department, patient noted to have continued abdominal distention. O2 sats remained stable on room air. Given symptomatic nature of patient's abdominal distention, hospital service consulted for consideration for admission.  Hospital Course:   1. Ascites. Noted on CT from 9/17. Chart reviewed. Patient known to Gyn-Onc, Dr. Denman George, for hx of ovarian cyst -underwent ultrasound guided paracentesis, pend labs, cytology. D/w patient, her family. She plans to f/u with Dr. Denman George next week with test results to discuss the management plan. Patient is already on lasix, recommended to cont every other day and f/u with PCP in 3-5 days   2. Abd pain.  suspect secondary to above ascites. Resolved  3. SOB. Resolved. Subjective shortness of breath noted, current >92% in RA 4. Afib, chronic. Presently stable, heart rate in the low 100s. Resume home regimen  5. Hx ovarian cyst. Followed by gynecology as per above. Recommend close follow-up with Gyn-Onc on hospital discharge 5-7 days.  Procedures:  paracentesis  (i.e. Studies not automatically included, echos, thoracentesis, etc; not x-rays)  Consultations:  IR  Discharge Exam: Vitals:   07/01/17 1122 07/01/17 1316  BP: 98/73   Pulse:    Resp:    Temp:    SpO2:  96%    General: alert. No distress  Cardiovascular: s1,s2 rrr Respiratory: CTA BL  Discharge Instructions  Discharge Instructions    Diet - low sodium heart healthy    Complete by:  As directed    Discharge instructions    Complete by:  As directed    Please follow up with GYN oncology in 5-7 days   Increase activity slowly    Complete by:  As directed      Allergies as of 07/01/2017      Reactions   Codeine Itching      Medication List    STOP taking these medications   HYDROmorphone 4 MG tablet Commonly known as:  DILAUDID   methotrexate 1 g injection Commonly known as:  50 mg/ml     TAKE these medications   albuterol 108 (90 Base) MCG/ACT inhaler Commonly known as:  PROAIR HFA Inhale 2 puffs into the lungs every 6 (six) hours as needed for wheezing or shortness of breath.   ALPRAZolam 0.25 MG tablet Commonly known as:  XANAX Take 0.25 mg by mouth  at bedtime as needed for anxiety.   azelastine 0.1 % nasal spray Commonly known as:  ASTELIN Place 2 sprays into both nostrils 2 (two) times daily. Use in each nostril as directed   budesonide-formoterol 160-4.5 MCG/ACT inhaler Commonly known as:  SYMBICORT Inhale 2 puffs into the lungs 2 (two) times daily.   diltiazem 120 MG 24 hr capsule Commonly known as:  CARTIA XT TAKE 1 CAPSULE BY MOUTH EVERY DAY   diphenoxylate-atropine 2.5-0.025 MG  tablet Commonly known as:  LOMOTIL Take 1 tablet by mouth 4 (four) times daily. What changed:  when to take this  reasons to take this   fluticasone 50 MCG/ACT nasal spray Commonly known as:  FLONASE Place 2 sprays into both nostrils 2 (two) times daily. For nasal congestion   folic acid 1 MG tablet Commonly known as:  FOLVITE Take 1 mg by mouth daily.   furosemide 20 MG tablet Commonly known as:  LASIX Take 1 tablet (20 mg total) by mouth every other day.   levothyroxine 137 MCG tablet Commonly known as:  SYNTHROID, LEVOTHROID Take 137 mcg by mouth every morning. For thyroid therapy   MAGNESIUM GLYCINATE PLUS PO Take 400 mg by mouth daily.   methylcellulose 1 % ophthalmic solution Commonly known as:  ARTIFICIAL TEARS Place 1 drop into both eyes 2 (two) times daily as needed. Dry eyes   Vitamin D 2000 units Caps Take 2,000 Units by mouth daily.   XARELTO 20 MG Tabs tablet Generic drug:  rivaroxaban TAKE 1 TABLET BY MOUTH EVERY DAY WITH SUPPER            Discharge Care Instructions        Start     Ordered   07/01/17 0000  Increase activity slowly     07/01/17 1321   07/01/17 0000  Diet - low sodium heart healthy     07/01/17 1321   07/01/17 0000  Discharge instructions    Comments:  Please follow up with GYN oncology in 5-7 days   07/01/17 1321   07/01/17 0000  furosemide (LASIX) 20 MG tablet  Every other day     07/01/17 1321     Allergies  Allergen Reactions  . Codeine Itching      The results of significant diagnostics from this hospitalization (including imaging, microbiology, ancillary and laboratory) are listed below for reference.    Significant Diagnostic Studies: Dg Chest 2 View  Result Date: 06/30/2017 CLINICAL DATA:  83 year old female with shortness of breath, vomiting and abdominal distention for 1 week. Peritoneal carcinomatosis diagnosed recently on CT. EXAM: CHEST  2 VIEW COMPARISON:  CT Abdomen and Pelvis 06/24/2017 and earlier.  FINDINGS: Upright AP and lateral views of the chest. Small bilateral pleural effusions. No pneumothorax or pulmonary edema. No pulmonary nodule or consolidation. Normal cardiac size and mediastinal contours. Calcified aortic atherosclerosis. Osteopenia. No acute osseous abnormality identified. No pneumoperitoneum identified. Visible bowel gas pattern is stable from the recent CT. IMPRESSION: 1. Small pleural effusions probably not significantly changed from the recent CT 06/24/2017. No other acute cardiopulmonary abnormality. 2. Stable visible bowel gas pattern.  No abdominal free air. Electronically Signed   By: Genevie Ann M.D.   On: 06/30/2017 10:20   US Pelvis Transvanginal Non-ob (tv Only)  Result Date: 06/25/2017 CLINICAL DATA:  Ovarian mass on CT EXAM: TRANSABDOMINAL AND TRANSVAGINAL ULTRASOUND OF PELVIS TECHNIQUE: Both transabdominal and transvaginal ultrasound examinations of the pelvis were performed. Transabdominal technique was performed for global imaging of the  pelvis including uterus, ovaries, adnexal regions, and pelvic cul-de-sac. It was necessary to proceed with endovaginal exam following the transabdominal exam to visualize the uterus, endometrium, ovaries and adnexa . COMPARISON:  CT 06/24/2017 FINDINGS: Uterus Measurements: 4.7 x 2.7 x 2.7 cm. No fibroids or other mass visualized. Endometrium Thickness: 2 mm in thickness.  No focal abnormality. Right ovary Measurements: 3.5 x 3.8 x 3.3 cm. 2.9 cm simple appearing cyst in the right ovary. Left ovary Measurements: 2.3 x 3.0 x 2.6 cm, difficult to visualize. No visible focal mass. Other findings Large amount of ascites noted in the pelvis. IMPRESSION: 2.9 cm simple appearing cyst in the right ovary. Left ovary grossly unremarkable. Large volume ascites in the pelvis. Electronically Signed   By: Rolm Baptise M.D.   On: 06/25/2017 12:09   US Pelvis (transabdominal Only)  Result Date: 06/25/2017 CLINICAL DATA:  Ovarian mass on CT EXAM:  TRANSABDOMINAL AND TRANSVAGINAL ULTRASOUND OF PELVIS TECHNIQUE: Both transabdominal and transvaginal ultrasound examinations of the pelvis were performed. Transabdominal technique was performed for global imaging of the pelvis including uterus, ovaries, adnexal regions, and pelvic cul-de-sac. It was necessary to proceed with endovaginal exam following the transabdominal exam to visualize the uterus, endometrium, ovaries and adnexa . COMPARISON:  CT 06/24/2017 FINDINGS: Uterus Measurements: 4.7 x 2.7 x 2.7 cm. No fibroids or other mass visualized. Endometrium Thickness: 2 mm in thickness.  No focal abnormality. Right ovary Measurements: 3.5 x 3.8 x 3.3 cm. 2.9 cm simple appearing cyst in the right ovary. Left ovary Measurements: 2.3 x 3.0 x 2.6 cm, difficult to visualize. No visible focal mass. Other findings Large amount of ascites noted in the pelvis. IMPRESSION: 2.9 cm simple appearing cyst in the right ovary. Left ovary grossly unremarkable. Large volume ascites in the pelvis. Electronically Signed   By: Rolm Baptise M.D.   On: 06/25/2017 12:09   Ct Abdomen Pelvis W Contrast  Result Date: 06/24/2017 CLINICAL DATA:  Abdominal pain with distention and nausea EXAM: CT ABDOMEN AND PELVIS WITH CONTRAST TECHNIQUE: Multidetector CT imaging of the abdomen and pelvis was performed using the standard protocol following bolus administration of intravenous contrast. CONTRAST:  171mL ISOVUE-300 IOPAMIDOL (ISOVUE-300) INJECTION 61% COMPARISON:  02/07/2017 FINDINGS: Lower chest: Small right-sided pleural effusion. No acute consolidation at the lung bases. Normal heart size. Hepatobiliary: No focal hepatic abnormality. Surgical absence of the gallbladder. No biliary dilatation. Pancreas: Unremarkable. No pancreatic ductal dilatation or surrounding inflammatory changes. Spleen: Stable 1.8 cm low-density lesion in the posterior spleen since 2017. Tiny hypodensity anterior spleen, also unchanged. Adrenals/Urinary Tract: Adrenal  glands are within normal limits. Kidneys show no hydronephrosis. The bladder is nearly empty. Stomach/Bowel: Stomach is nonenlarged. No dilated small bowel. No colon wall thickening. Appendix is not well seen but no right lower quadrant inflammation. Vascular/Lymphatic: Aortic atherosclerosis. No aneurysmal dilatation. Enlarging upper abdominal/retrocaval lymph node, now measuring 14 mm. Multiple small periaortic lymph nodes, slight increase in size compared to prior. Reproductive: Uterus is atrophic. Re- demonstrated 3.8 cm right adnexal cyst. Increasing soft tissue density in the left at adnexa, now measuring 2.9 cm, with possible posterior cystic component measuring 19 mm. Other: Negative for free air. Development of moderate abdominal and pelvic ascites. Diffuse nodular infiltration of the omentum and anterior mesentery. Musculoskeletal: Degenerative changes of the spine, most notable at L4-L5 and L5-S1. Status post left femoral intramedullary rod. IMPRESSION: 1. Interim finding of moderate ascites within the abdomen and pelvis with additional finding of diffuse nodular infiltration of the omentum and anterior  mesenteric fat, the appearance would be consistent with peritoneal carcinomatosis/metastatic disease. Increasing retroperitoneal and upper abdominal adenopathy. 2. Re- demonstrated 3.8 cm cyst in the right adnexa. Enlarging soft tissue density in the left adnexa now with possible cystic component posteriorly. In light of the above findings, concern is for ovarian neoplasm. Correlation with pelvic ultrasound recommended. 3. Small right-sided pleural effusion, new since prior study 4. Stable hypodense splenic lesions since 2017. Electronically Signed   By: Donavan Foil M.D.   On: 06/24/2017 22:00   US Paracentesis  Result Date: 07/01/2017 INDICATION: Omental nodularity, abdominal pain, ascites, retroperitoneal adenopathy. Request made for diagnostic and therapeutic paracentesis. EXAM: ULTRASOUND GUIDED  DIAGNOSTIC AND THERAPEUTIC PARACENTESIS MEDICATIONS: None. COMPLICATIONS: None immediate. PROCEDURE: Informed written consent was obtained from the patient after a discussion of the risks, benefits and alternatives to treatment. A timeout was performed prior to the initiation of the procedure. Initial ultrasound scanning demonstrates a small to moderate amount of ascites within the left lower abdominal quadrant. The left lower abdomen was prepped and draped in the usual sterile fashion. 2% lidocaine was used for local anesthesia. Following this, a Yueh catheter was introduced. An ultrasound image was saved for documentation purposes. The paracentesis was performed. The catheter was removed and a dressing was applied. The patient tolerated the procedure well without immediate post procedural complication. FINDINGS: A total of approximately 2.5 liters of turbid, light brown fluid was removed. Samples were sent to the laboratory as requested by the clinical team. IMPRESSION: Successful ultrasound-guided diagnostic and therapeutic paracentesis yielding 2.5 liters of peritoneal fluid. Read by: Rowe Robert, PA-C Electronically Signed   By: Markus Daft M.D.   On: 07/01/2017 11:49    Microbiology: No results found for this or any previous visit (from the past 240 hour(s)).   Labs: Basic Metabolic Panel:  Recent Labs Lab 06/24/17 1955 06/30/17 1235 07/01/17 0430  NA 138 133* 133*  K 4.4 3.8 3.9  CL 103 100* 100*  CO2 27 22 25   GLUCOSE 135* 103* 93  BUN 8 10 9   CREATININE 0.75 0.79 0.74  CALCIUM 8.5* 8.1* 7.6*   Liver Function Tests:  Recent Labs Lab 06/24/17 1955 06/30/17 1235 07/01/17 0430  AST 18 19 20   ALT 11* 12* 11*  ALKPHOS 41 42 34*  BILITOT 0.6 0.8 0.9  PROT 7.2 7.1 5.9*  ALBUMIN 3.1* 3.0* 2.4*    Recent Labs Lab 06/24/17 1955 06/30/17 1235  LIPASE 32 21   No results for input(s): AMMONIA in the last 168 hours. CBC:  Recent Labs Lab 06/24/17 1955 06/30/17 1235  07/01/17 0430  WBC 9.7 12.5* 10.0  HGB 14.4 14.4 12.9  HCT 43.9 42.9 38.1  MCV 87.6 85.5 83.9  PLT 445* 401* 336   Cardiac Enzymes: No results for input(s): CKTOTAL, CKMB, CKMBINDEX, TROPONINI in the last 168 hours. BNP: BNP (last 3 results) No results for input(s): BNP in the last 8760 hours.  ProBNP (last 3 results) No results for input(s): PROBNP in the last 8760 hours.  CBG: No results for input(s): GLUCAP in the last 168 hours.     SignedKinnie Feil  [facility] 07/01/2017, 1:21 PM

## 2017-07-02 ENCOUNTER — Telehealth: Payer: Self-pay | Admitting: *Deleted

## 2017-07-02 ENCOUNTER — Telehealth: Payer: Self-pay | Admitting: Emergency Medicine

## 2017-07-02 MED ORDER — BUDESONIDE-FORMOTEROL FUMARATE 160-4.5 MCG/ACT IN AERO: 2.0000 | INHALATION_SPRAY | Freq: Two times a day (BID) | RESPIRATORY_TRACT | 3 refills | Status: DC

## 2017-07-02 NOTE — Telephone Encounter (Signed)
Scheduled an appt with the daughter for October 9th at 12pm.

## 2017-07-02 NOTE — Telephone Encounter (Signed)
Sent in Rx to [facility] in [place]. Pt notified and nothinf further is needed.

## 2017-07-06 LAB — CULTURE, BODY FLUID-BOTTLE: CULTURE: NO GROWTH

## 2017-07-06 LAB — CULTURE, BODY FLUID W GRAM STAIN -BOTTLE

## 2017-07-07 ENCOUNTER — Inpatient Hospital Stay (HOSPITAL_COMMUNITY)
Admission: EM | Admit: 2017-07-07 | Discharge: 2017-07-10 | DRG: 844 | Disposition: A | Discharge status: Home / Self Care | Payer: Medicare Other | Attending: Internal Medicine | Admitting: Internal Medicine

## 2017-07-07 ENCOUNTER — Other Ambulatory Visit: Payer: Self-pay

## 2017-07-07 ENCOUNTER — Emergency Department (HOSPITAL_COMMUNITY): Payer: Medicare Other

## 2017-07-07 ENCOUNTER — Encounter (HOSPITAL_COMMUNITY): Payer: Self-pay

## 2017-07-07 DIAGNOSIS — C801 Malignant (primary) neoplasm, unspecified: Principal | ICD-10-CM | POA: Diagnosis present

## 2017-07-07 DIAGNOSIS — R188 Other ascites: Secondary | ICD-10-CM | POA: Diagnosis present

## 2017-07-07 DIAGNOSIS — M810 Age-related osteoporosis without current pathological fracture: Secondary | ICD-10-CM | POA: Diagnosis present

## 2017-07-07 DIAGNOSIS — Z7901 Long term (current) use of anticoagulants: Secondary | ICD-10-CM | POA: Diagnosis not present

## 2017-07-07 DIAGNOSIS — Z96652 Presence of left artificial knee joint: Secondary | ICD-10-CM | POA: Diagnosis present

## 2017-07-07 DIAGNOSIS — M797 Fibromyalgia: Secondary | ICD-10-CM | POA: Diagnosis present

## 2017-07-07 DIAGNOSIS — Z885 Allergy status to narcotic agent status: Secondary | ICD-10-CM | POA: Diagnosis not present

## 2017-07-07 DIAGNOSIS — I739 Peripheral vascular disease, unspecified: Secondary | ICD-10-CM | POA: Diagnosis present

## 2017-07-07 DIAGNOSIS — Z9049 Acquired absence of other specified parts of digestive tract: Secondary | ICD-10-CM | POA: Diagnosis not present

## 2017-07-07 DIAGNOSIS — Z818 Family history of other mental and behavioral disorders: Secondary | ICD-10-CM

## 2017-07-07 DIAGNOSIS — Z801 Family history of malignant neoplasm of trachea, bronchus and lung: Secondary | ICD-10-CM

## 2017-07-07 DIAGNOSIS — R402142 Coma scale, eyes open, spontaneous, at arrival to emergency department: Secondary | ICD-10-CM | POA: Diagnosis present

## 2017-07-07 DIAGNOSIS — Z23 Encounter for immunization: Secondary | ICD-10-CM | POA: Diagnosis present

## 2017-07-07 DIAGNOSIS — L270 Generalized skin eruption due to drugs and medicaments taken internally: Secondary | ICD-10-CM | POA: Diagnosis not present

## 2017-07-07 DIAGNOSIS — K5790 Diverticulosis of intestine, part unspecified, without perforation or abscess without bleeding: Secondary | ICD-10-CM | POA: Diagnosis present

## 2017-07-07 DIAGNOSIS — K219 Gastro-esophageal reflux disease without esophagitis: Secondary | ICD-10-CM | POA: Diagnosis present

## 2017-07-07 DIAGNOSIS — F419 Anxiety disorder, unspecified: Secondary | ICD-10-CM | POA: Diagnosis present

## 2017-07-07 DIAGNOSIS — R180 Malignant ascites: Secondary | ICD-10-CM | POA: Diagnosis present

## 2017-07-07 DIAGNOSIS — Z800 Family history of malignant neoplasm of digestive organs: Secondary | ICD-10-CM | POA: Diagnosis not present

## 2017-07-07 DIAGNOSIS — J440 Chronic obstructive pulmonary disease with acute lower respiratory infection: Secondary | ICD-10-CM | POA: Diagnosis present

## 2017-07-07 DIAGNOSIS — I480 Paroxysmal atrial fibrillation: Secondary | ICD-10-CM | POA: Diagnosis present

## 2017-07-07 DIAGNOSIS — J189 Pneumonia, unspecified organism: Secondary | ICD-10-CM | POA: Diagnosis present

## 2017-07-07 DIAGNOSIS — Z8249 Family history of ischemic heart disease and other diseases of the circulatory system: Secondary | ICD-10-CM | POA: Diagnosis not present

## 2017-07-07 DIAGNOSIS — R402252 Coma scale, best verbal response, oriented, at arrival to emergency department: Secondary | ICD-10-CM | POA: Diagnosis present

## 2017-07-07 DIAGNOSIS — Z7951 Long term (current) use of inhaled steroids: Secondary | ICD-10-CM

## 2017-07-07 DIAGNOSIS — T368X5A Adverse effect of other systemic antibiotics, initial encounter: Secondary | ICD-10-CM | POA: Diagnosis not present

## 2017-07-07 DIAGNOSIS — Z87891 Personal history of nicotine dependence: Secondary | ICD-10-CM

## 2017-07-07 DIAGNOSIS — Z86010 Personal history of colonic polyps: Secondary | ICD-10-CM

## 2017-07-07 DIAGNOSIS — E039 Hypothyroidism, unspecified: Secondary | ICD-10-CM | POA: Diagnosis present

## 2017-07-07 DIAGNOSIS — R402362 Coma scale, best motor response, obeys commands, at arrival to emergency department: Secondary | ICD-10-CM | POA: Diagnosis present

## 2017-07-07 DIAGNOSIS — L4050 Arthropathic psoriasis, unspecified: Secondary | ICD-10-CM | POA: Diagnosis present

## 2017-07-07 LAB — CBC WITH DIFFERENTIAL/PLATELET
BASOS ABS: 0.1 10*3/uL (ref 0.0–0.1)
BASOS PCT: 0 %
Eosinophils Absolute: 0.1 10*3/uL (ref 0.0–0.7)
Eosinophils Relative: 1 %
HCT: 43.8 % (ref 36.0–46.0)
Hemoglobin: 14.8 g/dL (ref 12.0–15.0)
Lymphocytes Relative: 8 %
Lymphs Abs: 1 10*3/uL (ref 0.7–4.0)
MCH: 29.1 pg (ref 26.0–34.0)
MCHC: 33.8 g/dL (ref 30.0–36.0)
MCV: 86.2 fL (ref 78.0–100.0)
MONO ABS: 1 10*3/uL (ref 0.1–1.0)
MONOS PCT: 9 %
Neutro Abs: 9.4 10*3/uL — ABNORMAL HIGH (ref 1.7–7.7)
Neutrophils Relative %: 82 %
PLATELETS: 377 10*3/uL (ref 150–400)
RBC: 5.08 MIL/uL (ref 3.87–5.11)
RDW: 14.7 % (ref 11.5–15.5)
WBC: 11.5 10*3/uL — ABNORMAL HIGH (ref 4.0–10.5)

## 2017-07-07 LAB — COMPREHENSIVE METABOLIC PANEL
ALBUMIN: 2.8 g/dL — AB (ref 3.5–5.0)
ALT: 15 U/L (ref 14–54)
ANION GAP: 9 (ref 5–15)
AST: 22 U/L (ref 15–41)
Alkaline Phosphatase: 46 U/L (ref 38–126)
BUN: 7 mg/dL (ref 6–20)
CHLORIDE: 101 mmol/L (ref 101–111)
CO2: 25 mmol/L (ref 22–32)
Calcium: 7.9 mg/dL — ABNORMAL LOW (ref 8.9–10.3)
Creatinine, Ser: 0.82 mg/dL (ref 0.44–1.00)
GFR calc Af Amer: >60 mL/min (ref >60)
GLUCOSE: 106 mg/dL — AB (ref 65–99)
POTASSIUM: 3.7 mmol/L (ref 3.5–5.1)
Sodium: 135 mmol/L (ref 135–145)
TOTAL PROTEIN: 6.2 g/dL — AB (ref 6.5–8.1)
Total Bilirubin: 0.8 mg/dL (ref 0.3–1.2)

## 2017-07-07 LAB — LIPASE, BLOOD: LIPASE: 18 U/L (ref 11–51)

## 2017-07-07 LAB — TROPONIN I: Troponin I: <0.03 ng/mL (ref <0.03)

## 2017-07-07 LAB — BRAIN NATRIURETIC PEPTIDE: B Natriuretic Peptide: 42.9 pg/mL (ref 0.0–100.0)

## 2017-07-07 MED ORDER — ORAL CARE MOUTH RINSE
15.0000 mL | Freq: Two times a day (BID) | OROMUCOSAL | Status: DC
  Administered 2017-07-07 – 2017-07-09 (×4): 15 mL via OROMUCOSAL

## 2017-07-07 MED ORDER — VITAMIN D 1000 UNITS PO TABS
2000.0000 [IU] | ORAL_TABLET | Freq: Every day | ORAL | Status: DC
Start: 2017-07-07 — End: 2017-07-10
  Administered 2017-07-07 – 2017-07-10 (×4): 2000 [IU] via ORAL
  Filled 2017-07-07 (×4): qty 2

## 2017-07-07 MED ORDER — ALBUTEROL SULFATE (2.5 MG/3ML) 0.083% IN NEBU
2.5000 mg | INHALATION_SOLUTION | Freq: Four times a day (QID) | RESPIRATORY_TRACT | Status: DC | PRN
  Administered 2017-07-07 – 2017-07-09 (×3): 2.5 mg via RESPIRATORY_TRACT
  Filled 2017-07-07 (×3): qty 3

## 2017-07-07 MED ORDER — DILTIAZEM HCL ER COATED BEADS 120 MG PO CP24
120.0000 mg | ORAL_CAPSULE | Freq: Every day | ORAL | Status: DC
  Administered 2017-07-07: 120 mg via ORAL
  Filled 2017-07-07 (×3): qty 1

## 2017-07-07 MED ORDER — DEXTROSE 5 % IV SOLN
1.0000 g | Freq: Three times a day (TID) | INTRAVENOUS | Status: DC
  Administered 2017-07-07 – 2017-07-09 (×6): 1 g via INTRAVENOUS
  Filled 2017-07-07 (×7): qty 1

## 2017-07-07 MED ORDER — PROCHLORPERAZINE MALEATE 10 MG PO TABS: 10.0000 mg | ORAL_TABLET | Freq: Four times a day (QID) | ORAL | Status: DC | PRN

## 2017-07-07 MED ORDER — INFLUENZA VAC SPLIT HIGH-DOSE 0.5 ML IM SUSY
0.5000 mL | PREFILLED_SYRINGE | INTRAMUSCULAR | Status: AC
  Administered 2017-07-09: 0.5 mL via INTRAMUSCULAR
  Filled 2017-07-07: qty 0.5

## 2017-07-07 MED ORDER — FLUTICASONE PROPIONATE 50 MCG/ACT NA SUSP
2.0000 | Freq: Two times a day (BID) | NASAL | Status: DC
  Administered 2017-07-08 – 2017-07-10 (×5): 2 via NASAL
  Filled 2017-07-07: qty 16

## 2017-07-07 MED ORDER — POLYVINYL ALCOHOL 1.4 % OP SOLN
1.0000 [drp] | Freq: Two times a day (BID) | OPHTHALMIC | Status: DC | PRN
  Filled 2017-07-07: qty 15

## 2017-07-07 MED ORDER — METHYLCELLULOSE 1 % OP SOLN: 1.0000 [drp] | Freq: Two times a day (BID) | OPHTHALMIC | Status: DC | PRN

## 2017-07-07 MED ORDER — MOMETASONE FURO-FORMOTEROL FUM 200-5 MCG/ACT IN AERO
2.0000 | INHALATION_SPRAY | Freq: Two times a day (BID) | RESPIRATORY_TRACT | Status: DC
  Administered 2017-07-08 – 2017-07-10 (×5): 2 via RESPIRATORY_TRACT
  Filled 2017-07-07: qty 8.8

## 2017-07-07 MED ORDER — DIPHENOXYLATE-ATROPINE 2.5-0.025 MG/5ML PO LIQD: 10.0000 mL | Freq: Four times a day (QID) | ORAL | Status: DC | PRN

## 2017-07-07 MED ORDER — DIPHENHYDRAMINE HCL 50 MG/ML IJ SOLN
25.0000 mg | Freq: Once | INTRAMUSCULAR | Status: AC
  Administered 2017-07-07: 25 mg via INTRAVENOUS
  Filled 2017-07-07: qty 1

## 2017-07-07 MED ORDER — LEVOTHYROXINE SODIUM 137 MCG PO TABS: 137.0000 ug | ORAL_TABLET | Freq: Every morning | ORAL | Status: DC

## 2017-07-07 MED ORDER — FUROSEMIDE 20 MG PO TABS
20.0000 mg | ORAL_TABLET | ORAL | Status: DC
  Administered 2017-07-08 – 2017-07-10 (×2): 20 mg via ORAL
  Filled 2017-07-07 (×2): qty 1

## 2017-07-07 MED ORDER — SODIUM CHLORIDE 0.9 % IV SOLN
INTRAVENOUS | Status: DC
  Administered 2017-07-07 (×2): via INTRAVENOUS

## 2017-07-07 MED ORDER — LEVOTHYROXINE SODIUM 25 MCG PO TABS
137.0000 ug | ORAL_TABLET | Freq: Every day | ORAL | Status: DC
  Administered 2017-07-08 – 2017-07-10 (×3): 137 ug via ORAL
  Filled 2017-07-07 (×3): qty 1

## 2017-07-07 MED ORDER — VANCOMYCIN HCL IN DEXTROSE 1-5 GM/200ML-% IV SOLN
1000.0000 mg | Freq: Once | INTRAVENOUS | Status: AC
  Administered 2017-07-07: 1000 mg via INTRAVENOUS
  Filled 2017-07-07: qty 200

## 2017-07-07 MED ORDER — ONDANSETRON HCL 4 MG PO TABS: 4.0000 mg | ORAL_TABLET | Freq: Four times a day (QID) | ORAL | Status: DC | PRN

## 2017-07-07 MED ORDER — ONDANSETRON HCL 4 MG/2ML IJ SOLN
4.0000 mg | Freq: Four times a day (QID) | INTRAMUSCULAR | Status: DC | PRN
  Administered 2017-07-08: 4 mg via INTRAVENOUS
  Filled 2017-07-07: qty 2

## 2017-07-07 MED ORDER — ALPRAZOLAM 0.25 MG PO TABS
0.2500 mg | ORAL_TABLET | Freq: Every evening | ORAL | Status: DC | PRN
  Administered 2017-07-07 – 2017-07-09 (×3): 0.25 mg via ORAL
  Filled 2017-07-07 (×3): qty 1

## 2017-07-07 MED ORDER — DEXTROSE 5 % IV SOLN
2.0000 g | Freq: Once | INTRAVENOUS | Status: AC
  Administered 2017-07-07: 2 g via INTRAVENOUS
  Filled 2017-07-07: qty 2

## 2017-07-07 MED ORDER — AZELASTINE HCL 0.1 % NA SOLN
2.0000 | Freq: Two times a day (BID) | NASAL | Status: DC
  Administered 2017-07-08 – 2017-07-10 (×5): 2 via NASAL
  Filled 2017-07-07: qty 30

## 2017-07-07 NOTE — H&P (Signed)
History and Physical    [name] FUX:[id number] DOB: 1933-08-03 DOA: 07/07/2017  Referring MD/NP/PA: Dr. Zenia Resides   PCP: Asencion Noble, MD   Patient coming from: home   Chief Complaint: abd pain   HPI: [name] is a 83 y.o. female with medical history significant of COPD, fibromyalgia, hypothyroidism, paroxysmal a-fib on chronic AC, recently discharged on 9/24 (was hospitalized for abd distension, ascites and had paracentesis done, CT imaging studies worrisome for ovarian neoplasm), now presented to [facility] ED with main concern of progressively worsening abd distension since the discharge and associated dyspnea that is present at rest and with exertion. Patient denies any fevers, chills, has intermittent non productive cough, denies chest pain.   In ED, pt was hemodynamically stable, VSS, physical exam notable for significant abd distension. CXR worrisome for developing PNA. Pt started on Vanc and Zosyn, soon after administration of Vancomycin started to develop rash all over her face. Vancomycin was stopped. [facility] asked to admit for further evaluation.   Assessment and plan: Abd ascites  - recently had paracentesis done and path report notable for malignant cells - suspect this is ovarian etiology but pt and family says they were never officially told there was any cancer - pt is scheduled to see Dr. Denman George on follow up but pt has not seen her yet - will admit to telemetry unit - IR consulted for paracentesis - will need to page GYN ONC in AM for consultation  - supportive care for now - analgesia as needed   Dyspnea - suspect this is related to severe abd ascites - ? Of bibasilar PNA on CXR but not too convincing - there is a mild leukocytosis but now fevers - pt not tolerant to vanc and I think it is reasonable to stop it - will continue Maxipime empirically - sputum studies requested  - narrow down ABX as clinically indicated   A-fib - on Xarelto but will hold for now  until paracentesis done - continue Cardizem   Hypothyroidism - continue synthroid   Review of Systems:  Constitutional: Negative for fever, chills, diaphoresis HENT: Negative for ear pain, nosebleeds, congestion, facial swelling, rhinorrhea, neck pain, neck stiffness and ear discharge.   Eyes: Negative for pain, discharge, redness, itching and visual disturbance.  Respiratory: Negative for cough, choking, chest tightness, wheezing and stridor.   Cardiovascular: Negative for chest pain, palpitations and leg swelling.  Gastrointestinal: Positive for abdominal distention.  Genitourinary: Negative for dysuria, urgency, frequency, hematuria, flank pain, decreased urine volume, difficulty urinating and dyspareunia.  Musculoskeletal: Negative for back pain, joint swelling, arthralgias and gait problem.  Neurological: Negative for dizziness, tremors, seizures, syncope, facial asymmetry, speech difficulty Hematological: Negative for adenopathy. Does not bruise/bleed easily.  Psychiatric/Behavioral: Negative for hallucinations, behavioral problems, confusion, dysphoric mood, decreased concentration and agitation.   Past Medical History:  Diagnosis Date  . COPD (chronic obstructive pulmonary disease) ([place])   . Depression   . Diverticulosis   . Family hx of colon cancer   . Fibromyalgia   . GERD (gastroesophageal reflux disease)   . Hemorrhoids   . Hiatal hernia   . History of shingles   . Hypothyroidism   . IBS (irritable bowel syndrome)   . Lymphocytic colitis   . Osteoporosis   . Paroxysmal atrial fibrillation (HCC)   . Psoriatic arthritis ([place])   . Rectal polyp   . Schatzki's ring   . Uterine polyp     Past Surgical History:  Procedure Laterality Date  .  CHOLECYSTECTOMY  Dec 04, 2010  . FEMUR IM NAIL Left 12/11/2013   Procedure: INTRAMEDULLARY (IM) NAIL FEMORAL;  Surgeon: [name], MD;  Location: [facility];  Service: Orthopedics;  Laterality: Left;  . FRACTURE SURGERY Left  12/11/2013   hip  . KNEE SURGERY Left    x 2  . TOTAL KNEE REVISION  08/06/2012   Procedure: TOTAL KNEE REVISION;  Surgeon: [name], MD;  Location: [facility];  Service: Orthopedics;  Laterality: Left;  Left Total Knee Arthroplasty Revision  . Uterine polypectomy     Social Hx:  reports that she quit smoking about 36 years ago. Her smoking use included Cigarettes. She has a 20.00 pack-year smoking history. She has never used smokeless tobacco. She reports that she drinks alcohol. She reports that she does not use drugs.  Allergies  Allergen Reactions  . Codeine Itching    Family History  Problem Relation Age of Onset  . Heart disease Father        pacemaker  . Lung cancer Father        hx of smoker  . Colon cancer Mother        dx in her mid 40's  . Colon cancer Maternal Grandfather   . Depression Sister     Medication Sig  albuterol (PROAIR HFA) 108  Inhale 2 puffs every 6 hrs as needed   ALPRAZolam (XANAX) 0.25 MG tablet Take 0.25 mg by mouth at bedtime as needed  budesonide 160-4.5 MCG/ACT inhaler Inhale 2 puffs into the lungs 2 (two) times daily.  diltiazem 120 MG 24 hr capsule TAKE 1 CAPSULE BY MOUTH EVERY DAY  furosemide (LASIX) 20 MG tablet Take 1 tablet (20 mg total) by mouth every other day.  levothyroxine 137 MCG tablet Take 137 mcg by mouth every morning.   MAGNESIUM GLYCINATE PLUS PO Take 400 mg by mouth daily.  XARELTO 20 MG TABS tablet TAKE 1 TABLET BY MOUTH EVERY DAY     Physical Exam: Vitals:   07/07/17 1300 07/07/17 1334 07/07/17 1430 07/07/17 1500  BP: (!) 129/57 (!) 162/70 (!) 127/59 (!) 126/58  Pulse: (!) 102 97 (!) 106 (!) 105  Resp: 12 (!) 21 15 (!) 21  Temp:      TempSrc:      SpO2: 98% 97% 97% 97%    Constitutional: NAD, calm, comfortable Vitals:   07/07/17 1300 07/07/17 1334 07/07/17 1430 07/07/17 1500  BP: (!) 129/57 (!) 162/70 (!) 127/59 (!) 126/58  Pulse: (!) 102 97 (!) 106 (!) 105  Resp: 12 (!) 21 15 (!) 21  Temp:      TempSrc:       SpO2: 98% 97% 97% 97%   Eyes: PERRL, lids and conjunctivae normal ENMT: Mucous membranes are moist. Posterior pharynx clear of any exudate or lesions.Normal dentition.  Neck: normal, supple, no masses, no thyromegaly Respiratory: clear to auscultation bilaterally, diminished breath sounds at bases  Cardiovascular: tachycardia, no murmurs / rubs / gallops. No extremity edema. 2+ pedal pulses. No carotid bruits.  Abdomen: distended and with fluid shift, no tenderness, no masses palpated.  Musculoskeletal: no clubbing / cyanosis. No joint deformity upper and lower extremities. Good ROM, no contractures.  Skin: no rashes, lesions, ulcers. No induration Neurologic: CN 2-12 grossly intact. Sensation intact, DTR normal. Strength 5/5 in all 4.  Psychiatric: Normal judgment and insight. Alert and oriented x 3. Normal mood.   Labs on Admission: I have personally reviewed following labs and imaging studies  CBC:  Recent Labs Lab 07/01/17 0430 07/07/17 1057  WBC 10.0 11.5*  NEUTROABS  --  9.4*  HGB 12.9 14.8  HCT 38.1 43.8  MCV 83.9 86.2  PLT 336 092   Basic Metabolic Panel:  Recent Labs Lab 07/01/17 0430 07/07/17 1057  NA 133* 135  K 3.9 3.7  CL 100* 101  CO2 25 25  GLUCOSE 93 106*  BUN 9 7  CREATININE 0.74 0.82  CALCIUM 7.6* 7.9*   Liver Function Tests:  Recent Labs Lab 07/01/17 0430 07/07/17 1057  AST 20 22  ALT 11* 15  ALKPHOS 34* 46  BILITOT 0.9 0.8  PROT 5.9* 6.2*  ALBUMIN 2.4* 2.8*    Recent Labs Lab 07/07/17 1057  LIPASE 18   Cardiac Enzymes:  Recent Labs Lab 07/07/17 1057  TROPONINI <0.03   Urine analysis:    Component Value Date/Time   COLORURINE YELLOW 06/30/2017 1235   APPEARANCEUR HAZY (A) 06/30/2017 1235   LABSPEC >1.030 (H) 06/30/2017 1235   PHURINE 6.0 06/30/2017 1235   GLUCOSEU 100 (A) 06/30/2017 1235   HGBUR MODERATE (A) 06/30/2017 1235   BILIRUBINUR MODERATE (A) 06/30/2017 1235   KETONESUR 40 (A) 06/30/2017 1235   PROTEINUR  100 (A) 06/30/2017 1235   UROBILINOGEN 0.2 12/08/2013 1845   NITRITE NEGATIVE 06/30/2017 1235   LEUKOCYTESUR NEGATIVE 06/30/2017 1235   Recent Results (from the past 240 hour(s))  Culture, body fluid-bottle     Status: None   Collection Time: 07/01/17 11:44 AM  Result Value Ref Range Status   Specimen Description FLUID PERITONEAL  Final   Special Requests NONE  Final   Culture   Final    NO GROWTH 5 DAYS Performed at [facility], [place] [address] [place], [place] [address]    Report Status 07/06/2017 FINAL  Final  Gram stain     Status: None   Collection Time: 07/01/17 11:44 AM  Result Value Ref Range Status   Specimen Description FLUID PERITONEAL  Final   Special Requests NONE  Final   Gram Stain   Final    RARE WBC PRESENT,BOTH PMN AND MONONUCLEAR NO ORGANISMS SEEN Performed at [facility], [place] [address] [place], [place] [address]    Report Status 07/01/2017 FINAL  Final     Radiological Exams on Admission: Dg Chest Port 1 View  Result Date: 07/07/2017 CLINICAL DATA:  Shortness of breath. EXAM: PORTABLE CHEST 1 VIEW COMPARISON:  06/30/2017 FINDINGS: The cardiomediastinal silhouette is unchanged and within normal limits. Aortic atherosclerosis is noted. There are small bilateral pleural effusions which appear larger than on the prior study, although this could be due to differences in patient positioning. Patchy opacity is present in the lung bases. No pneumothorax is identified. No acute osseous abnormality is seen. IMPRESSION: Small pleural effusions, likely increased in size with patchy bibasilar atelectasis or pneumonia. Electronically Signed   By: Logan Bores M.D.   On: 07/07/2017 11:24   EKG: pending   DVT prophylaxis: Xarelto on hold as pt needs paracentesis  Code Status: Full  Family Communication: Pt and family updated at bedside Disposition Plan: admit to tele unit  Consults called: IR Admission status: Inpatient   Faye Ramsay MD [facility] Pager [phone]  If 7PM-7AM, please contact night-coverage [email or web address] Password [email or web address]  07/07/2017, 4:24 PM

## 2017-07-07 NOTE — ED Triage Notes (Signed)
Patient present to ed with c/of respiratory distress, sob and chest pressure. Patient abdomen is distended. Pt was recently seen her in ed on 06/30/17.

## 2017-07-07 NOTE — Progress Notes (Signed)
Pharmacy Antibiotic Note  [name] is a 83 y.o. female admitted on 07/07/2017 with pneumonia.  Pharmacy has been consulted for cefepime dosing.  Patient had apparent reaction to vancomycin at end of infusion with dose given in ED. CXR with atelectasis vs pneumonia.  Patient recently admitted x 2 days for abd pain/new ascites  Plan:  Cefepime 1gm IV q8h     Temp (24hrs), Avg:97.9 F (36.6 C), Min:97.9 F (36.6 C), Max:97.9 F (36.6 C)   Recent Labs Lab 07/01/17 0430 07/07/17 1057  WBC 10.0 11.5*  CREATININE 0.74 0.82    Estimated Creatinine Clearance: 54.9 mL/min (by C-G formula based on SCr of 0.82 mg/dL).    Allergies  Allergen Reactions  . Codeine Itching    Antimicrobials this admission: 9/30 vanco x1 9/30 cefepime >>  Dose adjustments this admission:   Microbiology results: 9/30 BCx:  9/30 strep Ag:  9/30 sputum:   9/24 peritoneal fluid: NG  Thank you for allowing pharmacy to be a part of this patient's care.  Doreene Eland, PharmD, BCPS.   Pager: [phone] 07/07/2017 5:02 PM

## 2017-07-07 NOTE — Progress Notes (Signed)
A consult was received from an ED physician for Vancomycin and Cefepime per pharmacy dosing.  The patient's profile has been reviewed for ht/wt/allergies/indication/available labs.   A one time order has been placed for Vancomycin 1gm IV and Cefepime 2gm IV.  Further antibiotics/pharmacy consults should be ordered by admitting physician if indicated.                       Thank you,  Leone Haven, PharmD 07/07/17 @ 12:45

## 2017-07-07 NOTE — ED Notes (Signed)
Call report to [facility] [phone] at [phone]

## 2017-07-07 NOTE — ED Notes (Addendum)
Patient had reaction to the vancomycin at the end of the infusion. Pt only had about 15-20 cc left in the bag. Infusion is stop and doctor Doyle Askew made aware and given verbal order of benadryl 25 mg iv to be given to the patient.

## 2017-07-07 NOTE — ED Provider Notes (Signed)
Day DEPT Provider Note   CSN: [id number] Arrival date & time: 07/07/17  7681     History   Chief Complaint No chief complaint on file.   HPI [name] is a 83 y.o. female.  83 year old female presents with increased abdominal distention causing her to be dyspneic. Has been diagnosed recently with ovarian cancer and was admitted to the hospital for similar symptoms and required a paracentesis. That was done last week and since that time the fluid has began to reaccumulate. No fever, cough or congestion. Symptoms are better with lying flat. No pedal edema. No anginal type chest pain.No black or bloody stools. No treatment use prior to arrival      Past Medical History:  Diagnosis Date  . COPD (chronic obstructive pulmonary disease) ([place])   . Depression   . Diverticulosis   . Family hx of colon cancer   . Fibromyalgia   . GERD (gastroesophageal reflux disease)   . Hemorrhoids   . Hiatal hernia   . History of shingles   . Hypothyroidism   . IBS (irritable bowel syndrome)   . Lymphocytic colitis   . Osteoporosis   . Paroxysmal atrial fibrillation ([facility])   . Psoriatic arthritis ([place])   . Rectal polyp   . Schatzki's ring   . Uterine polyp     Patient Active Problem List   Diagnosis Date Noted  . Ascites 06/30/2017  . Ascites, malignant 06/30/2017  . Right ovarian cyst 01/18/2017  . Elevated CA-125 01/18/2017  . Depression 12/23/2015  . Peripheral edema 08/23/2015  . Fracture of hip, left, closed ([place]) 12/08/2013  . Hip fracture ([place]) 12/08/2013  . PAD (peripheral artery disease) ([place]) 11/25/2013  . Encounter for therapeutic drug monitoring 11/09/2013  . Atrial fibrillation ([place]) 05/07/2013  . Cough 03/20/2013  . Total knee replacement status 10/07/2012  . Knee pain 10/07/2012  . Knee stiffness 10/07/2012  . Tachycardia 09/17/2012  . Chest pain 09/17/2012  . Difficulty in walking(719.7) 09/16/2012  . Muscle weakness (generalized) 09/16/2012  .  Postop Acute blood loss anemia 08/08/2012  . Instability of prosthetic knee ([place]) 08/06/2012  . Bloating 02/14/2012  . Upper abdominal pain 02/14/2012  . Allergic rhinitis, seasonal 02/15/2011  . COLITIS 02/20/2010  . DIARRHEA 01/02/2010  . ABDOMINAL PAIN -GENERALIZED 01/02/2010  . PERSONAL HX COLONIC POLYPS 01/02/2010  . HYPOTHYROIDISM 12/28/2009  . COPD (chronic obstructive pulmonary disease) ([place]) 12/28/2009  . ARTHRITIS 12/28/2009    Past Surgical History:  Procedure Laterality Date  . CHOLECYSTECTOMY  Dec 04, 2010  . FEMUR IM NAIL Left 12/11/2013   Procedure: INTRAMEDULLARY (IM) NAIL FEMORAL;  Surgeon: [name], MD;  Location: [facility];  Service: Orthopedics;  Laterality: Left;  . FRACTURE SURGERY Left 12/11/2013   hip  . KNEE SURGERY Left    x 2  . TOTAL KNEE REVISION  08/06/2012   Procedure: TOTAL KNEE REVISION;  Surgeon: [name], MD;  Location: [facility];  Service: Orthopedics;  Laterality: Left;  Left Total Knee Arthroplasty Revision  . Uterine polypectomy      OB History    No data available       Home Medications    Prior to Admission medications   Medication Sig Start Date End Date Taking? Authorizing Provider  albuterol (PROAIR HFA) 108 (90 Base) MCG/ACT inhaler Inhale 2 puffs into the lungs every 6 (six) hours as needed for wheezing or shortness of breath. 06/19/17   Collene Gobble, MD  ALPRAZolam Duanne Moron) 0.25  MG tablet Take 0.25 mg by mouth at bedtime as needed for anxiety.    [provider]  azelastine (ASTELIN) 0.1 % nasal spray Place 2 sprays into both nostrils 2 (two) times daily. Use in each nostril as directed    [provider]  budesonide-formoterol (SYMBICORT) 160-4.5 MCG/ACT inhaler Inhale 2 puffs into the lungs 2 (two) times daily. 07/02/17   Collene Gobble, MD  Cholecalciferol (VITAMIN D) 2000 units CAPS Take 2,000 Units by mouth daily.    [provider]  diltiazem (CARTIA XT) 120 MG 24 hr capsule TAKE 1  CAPSULE BY MOUTH EVERY DAY 05/03/17   Satira Sark, MD  diphenoxylate-atropine (LOMOTIL) 2.5-0.025 MG tablet Take 1 tablet by mouth 4 (four) times daily. Patient taking differently: Take 1 tablet by mouth 4 (four) times daily as needed for diarrhea or loose stools.  08/21/16   [name], MD  fluticasone [facility]) 50 MCG/ACT nasal spray Place 2 sprays into both nostrils 2 (two) times daily. For nasal congestion 11/20/13   [provider]  folic acid (FOLVITE) 1 MG tablet Take 1 mg by mouth daily.    [provider]  furosemide (LASIX) 20 MG tablet Take 1 tablet (20 mg total) by mouth every other day. 07/01/17 07/01/18  Kinnie Feil, MD  levothyroxine (SYNTHROID, LEVOTHROID) 137 MCG tablet Take 137 mcg by mouth every morning. For thyroid therapy    [provider]  MAGNESIUM GLYCINATE PLUS PO Take 400 mg by mouth daily.    [provider]  methylcellulose (ARTIFICIAL TEARS) 1 % ophthalmic solution Place 1 drop into both eyes 2 (two) times daily as needed. Dry eyes    [provider]  XARELTO 20 MG TABS tablet TAKE 1 TABLET BY MOUTH EVERY DAY WITH SUPPER 04/23/17   Satira Sark, MD    Family History Family History  Problem Relation Age of Onset  . Heart disease Father        pacemaker  . Lung cancer Father        hx of smoker  . Colon cancer Mother        dx in her mid 14's  . Colon cancer Maternal Grandfather   . Depression Sister     Social History Social History  Substance Use Topics  . Smoking status: Former Smoker    Packs/day: 1.00    Years: 20.00    Types: Cigarettes    Quit date: 10/08/1980  . Smokeless tobacco: Never Used  . Alcohol use Yes     Comment: rarely, 12-23-15 rarely     Allergies   Codeine   Review of Systems Review of Systems  All other systems reviewed and are negative.    Physical Exam Updated Vital Signs BP (!) 142/73 (BP Location: Left Arm)   Pulse 99   Temp 97.9 F (36.6 C) (Oral)    Resp (!) 22   SpO2 99%   Physical Exam  Constitutional: She is oriented to person, place, and time. She appears well-developed and well-nourished.  Non-toxic appearance. No distress.  HENT:  Head: Normocephalic and atraumatic.  Eyes: Pupils are equal, round, and reactive to light. Conjunctivae, EOM and lids are normal.  Neck: Normal range of motion. Neck supple. No tracheal deviation present. No thyroid mass present.  Cardiovascular: Normal rate, regular rhythm and normal heart sounds.  Exam reveals no gallop.   No murmur heard. Pulmonary/Chest: Effort normal and breath sounds normal. No stridor. No respiratory distress. She has  no decreased breath sounds. She has no wheezes. She has no rhonchi. She has no rales.  Abdominal: Soft. Normal appearance and bowel sounds are normal. She exhibits distension and fluid wave. There is tenderness in the epigastric area. There is no rebound and no CVA tenderness.  Musculoskeletal: Normal range of motion. She exhibits no edema or tenderness.  Neurological: She is alert and oriented to person, place, and time. She has normal strength. No cranial nerve deficit or sensory deficit. GCS eye subscore is 4. GCS verbal subscore is 5. GCS motor subscore is 6.  Skin: Skin is warm and dry. No abrasion and no rash noted.  Psychiatric: She has a normal mood and affect. Her speech is normal and behavior is normal.  Nursing note and vitals reviewed.    ED Treatments / Results  Labs (all labs ordered are listed, but only abnormal results are displayed) Labs Reviewed  CBC WITH DIFFERENTIAL/PLATELET  COMPREHENSIVE METABOLIC PANEL  BRAIN NATRIURETIC PEPTIDE  TROPONIN I    EKG  EKG Interpretation None       Radiology No results found.  Procedures Procedures (including critical care time)  Medications Ordered in ED Medications  0.9 %  sodium chloride infusion (not administered)     Initial Impression / Assessment and Plan / ED Course  I have  reviewed the triage vital signs and the nursing notes.  Pertinent labs & imaging results that were available during my care of the patient were reviewed by me and considered in my medical decision making (see chart for details).     Patient's x-ray is concerning for pneumonia. She has had increased cough. Patient placed on antibiotics for presumed hcap Will consult hospitalist for admission  Final Clinical Impressions(s) / ED Diagnoses   Final diagnoses:  None    New Prescriptions New Prescriptions   No medications on file     Lacretia Leigh, MD 07/07/17 1258

## 2017-07-08 ENCOUNTER — Inpatient Hospital Stay (HOSPITAL_COMMUNITY): Payer: Medicare Other

## 2017-07-08 DIAGNOSIS — R180 Malignant ascites: Secondary | ICD-10-CM

## 2017-07-08 LAB — STREP PNEUMONIAE URINARY ANTIGEN: STREP PNEUMO URINARY ANTIGEN: NEGATIVE

## 2017-07-08 LAB — COMPREHENSIVE METABOLIC PANEL
ALT: 11 U/L — AB (ref 14–54)
AST: 17 U/L (ref 15–41)
Albumin: 2.4 g/dL — ABNORMAL LOW (ref 3.5–5.0)
Alkaline Phosphatase: 37 U/L — ABNORMAL LOW (ref 38–126)
Anion gap: 6 (ref 5–15)
BUN: 8 mg/dL (ref 6–20)
CHLORIDE: 103 mmol/L (ref 101–111)
CO2: 25 mmol/L (ref 22–32)
CREATININE: 0.68 mg/dL (ref 0.44–1.00)
Calcium: 7.9 mg/dL — ABNORMAL LOW (ref 8.9–10.3)
Glucose, Bld: 108 mg/dL — ABNORMAL HIGH (ref 65–99)
Potassium: 3.9 mmol/L (ref 3.5–5.1)
Sodium: 134 mmol/L — ABNORMAL LOW (ref 135–145)
TOTAL PROTEIN: 5.3 g/dL — AB (ref 6.5–8.1)
Total Bilirubin: 0.5 mg/dL (ref 0.3–1.2)

## 2017-07-08 LAB — CBC
HCT: 39.8 % (ref 36.0–46.0)
Hemoglobin: 13.1 g/dL (ref 12.0–15.0)
MCH: 28 pg (ref 26.0–34.0)
MCHC: 32.9 g/dL (ref 30.0–36.0)
MCV: 85 fL (ref 78.0–100.0)
PLATELETS: 335 10*3/uL (ref 150–400)
RBC: 4.68 MIL/uL (ref 3.87–5.11)
RDW: 14.7 % (ref 11.5–15.5)
WBC: 10.6 10*3/uL — AB (ref 4.0–10.5)

## 2017-07-08 MED ORDER — DILTIAZEM HCL ER COATED BEADS 120 MG PO CP24
120.0000 mg | ORAL_CAPSULE | Freq: Every day | ORAL | Status: DC
  Administered 2017-07-08 – 2017-07-09 (×2): 120 mg via ORAL
  Filled 2017-07-08 (×2): qty 1

## 2017-07-08 MED ORDER — RIVAROXABAN 20 MG PO TABS
20.0000 mg | ORAL_TABLET | Freq: Every day | ORAL | Status: DC
  Administered 2017-07-08 – 2017-07-09 (×2): 20 mg via ORAL
  Filled 2017-07-08 (×2): qty 1

## 2017-07-08 MED ORDER — ALUM & MAG HYDROXIDE-SIMETH 200-200-20 MG/5ML PO SUSP: 15.0000 mL | ORAL | Status: DC | PRN

## 2017-07-08 MED ORDER — LIDOCAINE HCL 2 % IJ SOLN
INTRAMUSCULAR | Status: AC
  Filled 2017-07-08: qty 10

## 2017-07-08 MED ORDER — PANTOPRAZOLE SODIUM 40 MG PO TBEC
40.0000 mg | DELAYED_RELEASE_TABLET | Freq: Every day | ORAL | Status: DC
  Administered 2017-07-08 – 2017-07-10 (×3): 40 mg via ORAL
  Filled 2017-07-08 (×3): qty 1

## 2017-07-08 NOTE — Procedures (Signed)
Ultrasound-guided  therapeutic paracentesis performed yielding 2.7 liters of turbid, light brown fluid. No immediate complications.

## 2017-07-08 NOTE — Progress Notes (Addendum)
Patient ID: [name], female   DOB: [birth year], 83 y.o.   MRN: [id number]  PROGRESS NOTE    [name]  CXK:[id number] DOB: 1933-01-05 DOA: 07/07/2017  PCP: Asencion Noble, MD   Brief Narrative:  83 y.o. female with medical history significant of COPD, fibromyalgia, hypothyroidism, paroxysmal a-fib on chronic AC, recently discharged on 9/24 (was hospitalized for abd distension, ascites and had paracentesis done, CT imaging studies worrisome for ovarian neoplasm), now presented to [facility] ED with main concern of progressively worsening abd distension since the discharge and associated dyspnea that is present at rest and with exertion.   In ED, pt was hemodynamically stable, VSS, physical exam notable for significant abd distension. CXR worrisome for developing PNA. Pt started on Vanc and Zosyn, soon after administration of Vancomycin started to develop rash all over her face. Vancomycin was stopped.    Assessment & Plan:   Abdominal ascites  - Has had paracentesis recently and pathology report notable for malignant cells - Patient was supposed to see Dr. Denman George on outpatient basis - Paracentesis planned for today - Continue supportive care  Dyspnea - Secondary to abdominal ascites - Possible pneumonia on chest x-ray but patient did not have any fever or cough - Patient did not tolerate vancomycin for this was stopped shortly after it was administered 07/07/2017 - Continue cefepime empirically - Follow sputum cultures  A-fib, paroxysmal - CHA2DS2-VASc Score is 3 - Rate controlled with Cardizem - Resume xarelto after paracentesis   Hypothyroidism - Continue Synthroid  DVT prophylaxis: SCD's Code Status: full code  Family Communication: No family at the bedside Disposition Plan: Plan for paracentesis today   Consultants:   None   Procedures:   Plan for paracentesis today   Antimicrobials:   Vanco  9/30  Cefepime 9/30 -->   Subjective: Feels short of  breath.  Objective: Vitals:   07/07/17 2021 07/07/17 2246 07/08/17 0701 07/08/17 0908  BP: (!) 143/75  131/70   Pulse: (!) 107  (!) 101   Resp: 20  18   Temp: 98 F (36.7 C)  97.8 F (36.6 C)   TempSrc: Oral  Oral   SpO2: 97% 97% 100% 97%  Weight: 82.9 kg (182 lb 12.2 oz)  83.2 kg (183 lb 6.8 oz)   Height: 5' 4"  (1.626 m)       Intake/Output Summary (Last 24 hours) at 07/08/17 1014 Last data filed at 07/08/17 0200  Gross per 24 hour  Intake           399.17 ml  Output                0 ml  Net           399.17 ml   Filed Weights   07/07/17 2021 07/08/17 0701  Weight: 82.9 kg (182 lb 12.2 oz) 83.2 kg (183 lb 6.8 oz)    Examination:  General exam: Appears calm and comfortable  Respiratory system: Diminished breath sounds, no wheezing Cardiovascular system: S1 & S2 heard, RRR. No JVD, murmurs, rubs, gallops or clicks. No pedal edema. Gastrointestinal system: Abdomen is softly distended, (+) fluid wave  Central nervous system: Alert and oriented. No focal neurological deficits. Extremities: Symmetric 5 x 5 power. Skin: No rashes, lesions or ulcers Psychiatry: Judgement and insight appear normal. Mood & affect appropriate.   Data Reviewed: I have personally reviewed following labs and imaging studies  CBC:  Recent Labs Lab 07/07/17 1057 07/08/17 0441  WBC 11.5* 10.6*  NEUTROABS 9.4*  --   HGB 14.8 13.1  HCT 43.8 39.8  MCV 86.2 85.0  PLT 377 093   Basic Metabolic Panel:  Recent Labs Lab 07/07/17 1057 07/08/17 0441  NA 135 134*  K 3.7 3.9  CL 101 103  CO2 25 25  GLUCOSE 106* 108*  BUN 7 8  CREATININE 0.82 0.68  CALCIUM 7.9* 7.9*   GFR: Estimated Creatinine Clearance: 56.6 mL/min (by C-G formula based on SCr of 0.68 mg/dL). Liver Function Tests:  Recent Labs Lab 07/07/17 1057 07/08/17 0441  AST 22 17  ALT 15 11*  ALKPHOS 46 37*  BILITOT 0.8 0.5  PROT 6.2* 5.3*  ALBUMIN 2.8* 2.4*    Recent Labs Lab 07/07/17 1057  LIPASE 18   No results  for input(s): AMMONIA in the last 168 hours. Coagulation Profile: No results for input(s): INR, PROTIME in the last 168 hours. Cardiac Enzymes:  Recent Labs Lab 07/07/17 1057  TROPONINI <0.03   BNP (last 3 results) No results for input(s): PROBNP in the last 8760 hours. HbA1C: No results for input(s): HGBA1C in the last 72 hours. CBG: No results for input(s): GLUCAP in the last 168 hours. Lipid Profile: No results for input(s): CHOL, HDL, LDLCALC, TRIG, CHOLHDL, LDLDIRECT in the last 72 hours. Thyroid Function Tests: No results for input(s): TSH, T4TOTAL, FREET4, T3FREE, THYROIDAB in the last 72 hours. Anemia Panel: No results for input(s): VITAMINB12, FOLATE, FERRITIN, TIBC, IRON, RETICCTPCT in the last 72 hours. Urine analysis:    Component Value Date/Time   COLORURINE YELLOW 06/30/2017 1235   APPEARANCEUR HAZY (A) 06/30/2017 1235   LABSPEC >1.030 (H) 06/30/2017 1235   PHURINE 6.0 06/30/2017 1235   GLUCOSEU 100 (A) 06/30/2017 1235   HGBUR MODERATE (A) 06/30/2017 1235   BILIRUBINUR MODERATE (A) 06/30/2017 1235   KETONESUR 40 (A) 06/30/2017 1235   PROTEINUR 100 (A) 06/30/2017 1235   UROBILINOGEN 0.2 12/08/2013 1845   NITRITE NEGATIVE 06/30/2017 1235   LEUKOCYTESUR NEGATIVE 06/30/2017 1235   Sepsis Labs: @LABRCNTIP (procalcitonin:4,lacticidven:4)   Recent Results (from the past 240 hour(s))  Culture, body fluid-bottle     Status: None   Collection Time: 07/01/17 11:44 AM  Result Value Ref Range Status   Specimen Description FLUID PERITONEAL  Final   Special Requests NONE  Final   Culture   Final    NO GROWTH 5 DAYS Performed at [facility], [place] [address], [place], [place] [address]    Report Status 07/06/2017 FINAL  Final  Gram stain     Status: None   Collection Time: 07/01/17 11:44 AM  Result Value Ref Range Status   Specimen Description FLUID PERITONEAL  Final   Special Requests NONE  Final   Gram Stain   Final    RARE WBC PRESENT,BOTH PMN AND  MONONUCLEAR NO ORGANISMS SEEN Performed at [facility], [place] [address] [place], [place] [address]    Report Status 07/01/2017 FINAL  Final      Radiology Studies: Dg Chest Port 1 View Result Date: 07/07/2017 Small pleural effusions, likely increased in size with patchy bibasilar atelectasis or pneumonia.     Scheduled Meds: . azelastine  2 spray Each Nare BID  . cholecalciferol  2,000 Units Oral Daily  . diltiazem  120 mg Oral Daily  . fluticasone  2 spray Each Nare BID  . furosemide  20 mg Oral QODAY  . levothyroxine  137 mcg Oral QAC breakfast  . mometasone-formoterol  2 puff Inhalation BID  Continuous Infusions: . sodium chloride 10 mL/hr at 07/07/17 2025  . ceFEPime (MAXIPIME) IV Stopped (07/08/17 8250)     LOS: 1 day    Time spent: 25 minutes  Greater than 50% of the time spent on counseling and coordinating the care.   Leisa Lenz, MD [facility] Pager [phone]  If 7PM-7AM, please contact night-coverage [email or web address] Password [email or web address] 07/08/2017, 10:14 AM

## 2017-07-09 LAB — CBC
HCT: 39.1 % (ref 36.0–46.0)
Hemoglobin: 13 g/dL (ref 12.0–15.0)
MCH: 28.8 pg (ref 26.0–34.0)
MCHC: 33.2 g/dL (ref 30.0–36.0)
MCV: 86.7 fL (ref 78.0–100.0)
PLATELETS: 294 10*3/uL (ref 150–400)
RBC: 4.51 MIL/uL (ref 3.87–5.11)
RDW: 14.9 % (ref 11.5–15.5)
WBC: 8.2 10*3/uL (ref 4.0–10.5)

## 2017-07-09 LAB — BASIC METABOLIC PANEL
ANION GAP: 6 (ref 5–15)
BUN: 9 mg/dL (ref 6–20)
CALCIUM: 7.5 mg/dL — AB (ref 8.9–10.3)
CO2: 27 mmol/L (ref 22–32)
Chloride: 103 mmol/L (ref 101–111)
Creatinine, Ser: 0.81 mg/dL (ref 0.44–1.00)
GFR calc Af Amer: >60 mL/min (ref >60)
GLUCOSE: 101 mg/dL — AB (ref 65–99)
Potassium: 3.4 mmol/L — ABNORMAL LOW (ref 3.5–5.1)
SODIUM: 136 mmol/L (ref 135–145)

## 2017-07-09 NOTE — Progress Notes (Signed)
SATURATION QUALIFICATIONS: (This note is used to comply with regulatory documentation for home oxygen)  Patient Saturations on Room Air at Rest = 95%  Patient Saturations on Room Air while Ambulating = 84%  Patient Saturations on 2 Liters of oxygen while Ambulating = 88%  Please briefly explain why patient needs home oxygen: Neb treatments and lasix tried and failed.

## 2017-07-09 NOTE — Discharge Instructions (Signed)
Ascites °Ascites is a collection of excess fluid in the abdomen. Ascites can range from mild to severe. It can get worse without treatment. °What are the causes? °Possible causes include: °· Cirrhosis. This is the most common cause of ascites. °· Infection or inflammation in the abdomen. °· Cancer in the abdomen. °· Heart failure. °· Kidney disease. °· Inflammation of the pancreas. °· Clots in the veins of the liver. ° °What are the signs or symptoms? °Signs and symptoms may include: °· A feeling of fullness in your abdomen. This is common. °· An increase in the size of your abdomen or your waist. °· Swelling in your legs. °· Swelling of the scrotum in men. °· Difficulty breathing. °· Abdominal pain. °· Sudden weight gain. ° °If the condition is mild, you may not have symptoms. °How is this diagnosed? °To make a diagnosis, your health care provider will: °· Ask about your medical history. °· Perform a physical exam. °· Order imaging tests, such as an ultrasound or CT scan of your abdomen. ° °How is this treated? °Treatment depends on the cause of the ascites. It may include: °· Taking a pill to make you urinate. This is called a water pill (diuretic pill). °· Strictly reducing your salt (sodium) intake. Salt can cause extra fluid to be kept in the body, and this makes ascites worse. °· Having a procedure to remove fluid from your abdomen (paracentesis). °· Having a procedure to transfer fluid from your abdomen into a vein. °· Having a procedure that connects two of the major veins within your liver and relieves pressure on your liver (TIPS procedure). ° °Ascites may go away or improve with treatment of the condition that caused it. °Follow these instructions at home: °· Keep track of your weight. To do this, weigh yourself at the same time every day and record your weight. °· Keep track of how much you drink and any changes in the amount you urinate. °· Follow any instructions that your health care provider gives  you about how much to drink. °· Try not to eat salty (high-sodium) foods. °· Take medicines only as directed by your health care provider. °· Keep all follow-up visits as directed by your health care provider. This is important. °· Report any changes in your health to your health care provider, especially if you develop new symptoms or your symptoms get worse. °Contact a health care provider if: °· Your gain more than 3 pounds in 3 days. °· Your abdominal size or your waist size increases. °· You have new swelling in your legs. °· The swelling in your legs gets worse. °Get help right away if: °· You develop a fever. °· You develop confusion. °· You develop new or worsening difficulty breathing. °· You develop new or worsening abdominal pain. °· You develop new or worsening swelling in the scrotum (in men). °This information is not intended to replace advice given to you by your health care provider. Make sure you discuss any questions you have with your health care provider. °Document Released: 09/24/2005 Document Revised: 02/01/2016 Document Reviewed: 04/23/2014 °Elsevier Interactive Patient Education © 2018 [employer] ° °

## 2017-07-09 NOTE — Discharge Summary (Signed)
Physician Discharge Summary  HOLY BATTENFIELD QIO:[id number] DOB: Mar 03, 1933 DOA: 07/07/2017  PCP: Asencion Noble, MD  Admit date: 07/07/2017 Discharge date: 07/09/2017  Recommendations for Outpatient Follow-up:  1. IR to arrange outpt follow up this Friday to reassess if pt would need another tap  Discharge Diagnoses:  Active Problems:   PNA (pneumonia)    Discharge Condition: stable   Diet recommendation: as tolerated   History of present illness:  83 y.o.femalewith medical history significant of COPD, fibromyalgia, hypothyroidism, paroxysmal a-fib on chronic AC, recently discharged on 9/24 (was hospitalized for abd distension, ascites and had paracentesis done, CT imaging studies worrisome for ovarian neoplasm), now presented to [facility] ED with main concern of progressively worsening abd distension since the discharge and associated dyspnea that is present at rest and with exertion.   In ED, pt was hemodynamically stable, VSS, physical exam notable for significant abd distension. CXR worrisome for developing PNA. Pt started on Vanc and Zosyn, soon after administration of Vancomycin started to develop rash all over her face. Vancomycin was stopped.   Hospital Course:  Abdominal ascites, malignant  - Has had paracentesis recently and pathology report notable for malignant cells - Patient was supposed to see Dr. Denman George on outpatient basis - S/P paracentesis 10/1 with 2/7 L fluid removed - Shortness of breath resolved   Dyspnea - Secondary to abdominal ascites - Possible pneumonia on chest x-ray but patient did not have any fever or cough - Patient did not tolerate vancomycin for this was stopped shortly after it was administered 07/07/2017 - Stop cefepime, shortness of breath better with paracentesis so PNA less likely   A-fib, paroxysmal - CHA2DS2-VASc Score is 3 - Continue Cardizem  - Continue xarelto   Hypothyroidism - Continue Synthroid  DVT prophylaxis: SCD's Code  Status: full code  Family Communication: daughter at the bedside     Consultants:   None   Procedures:   Paracentesis 10/1   Antimicrobials:   Vanco  9/30  Cefepime 9/30 --> 10/2   Signed:  Leisa Lenz, MD  [facility] 07/09/2017, 12:59 PM  Pager #: [phone]  Time spent in minutes: more than 30 minutes   Discharge Exam: Vitals:   07/09/17 0705 07/09/17 0744  BP: (!) 110/53   Pulse: 80   Resp: 20   Temp: 98.3 F (36.8 C)   SpO2: 96% 98%   Vitals:   07/08/17 1939 07/08/17 2220 07/09/17 0705 07/09/17 0744  BP:  127/61 (!) 110/53   Pulse:  99 80   Resp:  18 20   Temp:  98.8 F (37.1 C) 98.3 F (36.8 C)   TempSrc:  Oral Oral   SpO2: 95% 96% 96% 98%  Weight:   80.7 kg (177 lb 14.6 oz)   Height:        General: Pt is alert, follows commands appropriately, not in acute distress Cardiovascular: Regular rate and rhythm, S1/S2 +, no murmurs Respiratory: Clear to auscultation bilaterally, no wheezing, no crackles, no rhonchi Abdominal: Soft, distended, (+) BS Extremities: no edema, no cyanosis, pulses palpable bilaterally DP and PT Neuro: Grossly nonfocal  Discharge Instructions  Discharge Instructions    Call MD for:  persistant nausea and vomiting    Complete by:  As directed    Call MD for:  redness, tenderness, or signs of infection (pain, swelling, redness, odor or green/yellow discharge around incision site)    Complete by:  As directed    Call MD for:  severe uncontrolled pain  Complete by:  As directed    Diet - low sodium heart healthy    Complete by:  As directed    Discharge instructions    Complete by:  As directed    Interventional radiology to schedule outpatient paracentesis prior to weekend.   Increase activity slowly    Complete by:  As directed      Allergies as of 07/09/2017      Reactions   Codeine Itching      Medication List    TAKE these medications   albuterol 108 (90 Base) MCG/ACT inhaler Commonly  known as:  PROAIR HFA Inhale 2 puffs into the lungs every 6 (six) hours as needed for wheezing or shortness of breath.   ALPRAZolam 0.25 MG tablet Commonly known as:  XANAX Take 0.25 mg by mouth at bedtime as needed for anxiety.   azelastine 0.1 % nasal spray Commonly known as:  ASTELIN Place 2 sprays into both nostrils 2 (two) times daily. Use in each nostril as directed   budesonide-formoterol 160-4.5 MCG/ACT inhaler Commonly known as:  SYMBICORT Inhale 2 puffs into the lungs 2 (two) times daily.   diltiazem 120 MG 24 hr capsule Commonly known as:  CARTIA XT TAKE 1 CAPSULE BY MOUTH EVERY DAY   diphenoxylate-atropine 2.5-0.025 MG tablet Commonly known as:  LOMOTIL Take 1 tablet by mouth 4 (four) times daily. What changed:  when to take this  reasons to take this   fluticasone 50 MCG/ACT nasal spray Commonly known as:  FLONASE Place 2 sprays into both nostrils 2 (two) times daily. For nasal congestion   furosemide 20 MG tablet Commonly known as:  LASIX Take 1 tablet (20 mg total) by mouth every other day.   levothyroxine 137 MCG tablet Commonly known as:  SYNTHROID, LEVOTHROID Take 137 mcg by mouth every morning. For thyroid therapy   MAGNESIUM GLYCINATE PLUS PO Take 400 mg by mouth daily.   methylcellulose 1 % ophthalmic solution Commonly known as:  ARTIFICIAL TEARS Place 1 drop into both eyes 2 (two) times daily as needed. Dry eyes   prochlorperazine 10 MG tablet Commonly known as:  COMPAZINE Take 10 mg by mouth every 6 (six) hours as needed for nausea.   Vitamin D 2000 units Caps Take 2,000 Units by mouth daily.   XARELTO 20 MG Tabs tablet Generic drug:  rivaroxaban TAKE 1 TABLET BY MOUTH EVERY DAY WITH SUPPER      Follow-up Information    Asencion Noble, MD. Schedule an appointment as soon as possible for a visit in 1 week(s).   Specialty:  Internal Medicine Contact information: [address] [place] [address] [phone]             The results of significant diagnostics from this hospitalization (including imaging, microbiology, ancillary and laboratory) are listed below for reference.    Significant Diagnostic Studies: Dg Chest 2 View  Result Date: 06/30/2017 CLINICAL DATA:  83 year old female with shortness of breath, vomiting and abdominal distention for 1 week. Peritoneal carcinomatosis diagnosed recently on CT. EXAM: CHEST  2 VIEW COMPARISON:  CT Abdomen and Pelvis 06/24/2017 and earlier. FINDINGS: Upright AP and lateral views of the chest. Small bilateral pleural effusions. No pneumothorax or pulmonary edema. No pulmonary nodule or consolidation. Normal cardiac size and mediastinal contours. Calcified aortic atherosclerosis. Osteopenia. No acute osseous abnormality identified. No pneumoperitoneum identified. Visible bowel gas pattern is stable from the recent CT. IMPRESSION: 1. Small pleural effusions probably not significantly changed from the recent  CT 06/24/2017. No other acute cardiopulmonary abnormality. 2. Stable visible bowel gas pattern.  No abdominal free air. Electronically Signed   By: Genevie Ann M.D.   On: 06/30/2017 10:20   US Pelvis Transvanginal Non-ob (tv Only)  Result Date: 06/25/2017 CLINICAL DATA:  Ovarian mass on CT EXAM: TRANSABDOMINAL AND TRANSVAGINAL ULTRASOUND OF PELVIS TECHNIQUE: Both transabdominal and transvaginal ultrasound examinations of the pelvis were performed. Transabdominal technique was performed for global imaging of the pelvis including uterus, ovaries, adnexal regions, and pelvic cul-de-sac. It was necessary to proceed with endovaginal exam following the transabdominal exam to visualize the uterus, endometrium, ovaries and adnexa . COMPARISON:  CT 06/24/2017 FINDINGS: Uterus Measurements: 4.7 x 2.7 x 2.7 cm. No fibroids or other mass visualized. Endometrium Thickness: 2 mm in thickness.  No focal abnormality. Right ovary Measurements: 3.5 x 3.8 x 3.3 cm. 2.9 cm simple appearing cyst in  the right ovary. Left ovary Measurements: 2.3 x 3.0 x 2.6 cm, difficult to visualize. No visible focal mass. Other findings Large amount of ascites noted in the pelvis. IMPRESSION: 2.9 cm simple appearing cyst in the right ovary. Left ovary grossly unremarkable. Large volume ascites in the pelvis. Electronically Signed   By: Rolm Baptise M.D.   On: 06/25/2017 12:09   US Pelvis (transabdominal Only)  Result Date: 06/25/2017 CLINICAL DATA:  Ovarian mass on CT EXAM: TRANSABDOMINAL AND TRANSVAGINAL ULTRASOUND OF PELVIS TECHNIQUE: Both transabdominal and transvaginal ultrasound examinations of the pelvis were performed. Transabdominal technique was performed for global imaging of the pelvis including uterus, ovaries, adnexal regions, and pelvic cul-de-sac. It was necessary to proceed with endovaginal exam following the transabdominal exam to visualize the uterus, endometrium, ovaries and adnexa . COMPARISON:  CT 06/24/2017 FINDINGS: Uterus Measurements: 4.7 x 2.7 x 2.7 cm. No fibroids or other mass visualized. Endometrium Thickness: 2 mm in thickness.  No focal abnormality. Right ovary Measurements: 3.5 x 3.8 x 3.3 cm. 2.9 cm simple appearing cyst in the right ovary. Left ovary Measurements: 2.3 x 3.0 x 2.6 cm, difficult to visualize. No visible focal mass. Other findings Large amount of ascites noted in the pelvis. IMPRESSION: 2.9 cm simple appearing cyst in the right ovary. Left ovary grossly unremarkable. Large volume ascites in the pelvis. Electronically Signed   By: Rolm Baptise M.D.   On: 06/25/2017 12:09   Ct Abdomen Pelvis W Contrast  Result Date: 06/24/2017 CLINICAL DATA:  Abdominal pain with distention and nausea EXAM: CT ABDOMEN AND PELVIS WITH CONTRAST TECHNIQUE: Multidetector CT imaging of the abdomen and pelvis was performed using the standard protocol following bolus administration of intravenous contrast. CONTRAST:  190mL ISOVUE-300 IOPAMIDOL (ISOVUE-300) INJECTION 61% COMPARISON:  02/07/2017  FINDINGS: Lower chest: Small right-sided pleural effusion. No acute consolidation at the lung bases. Normal heart size. Hepatobiliary: No focal hepatic abnormality. Surgical absence of the gallbladder. No biliary dilatation. Pancreas: Unremarkable. No pancreatic ductal dilatation or surrounding inflammatory changes. Spleen: Stable 1.8 cm low-density lesion in the posterior spleen since 2017. Tiny hypodensity anterior spleen, also unchanged. Adrenals/Urinary Tract: Adrenal glands are within normal limits. Kidneys show no hydronephrosis. The bladder is nearly empty. Stomach/Bowel: Stomach is nonenlarged. No dilated small bowel. No colon wall thickening. Appendix is not well seen but no right lower quadrant inflammation. Vascular/Lymphatic: Aortic atherosclerosis. No aneurysmal dilatation. Enlarging upper abdominal/retrocaval lymph node, now measuring 14 mm. Multiple small periaortic lymph nodes, slight increase in size compared to prior. Reproductive: Uterus is atrophic. Re- demonstrated 3.8 cm right adnexal cyst. Increasing soft tissue density in  the left at adnexa, now measuring 2.9 cm, with possible posterior cystic component measuring 19 mm. Other: Negative for free air. Development of moderate abdominal and pelvic ascites. Diffuse nodular infiltration of the omentum and anterior mesentery. Musculoskeletal: Degenerative changes of the spine, most notable at L4-L5 and L5-S1. Status post left femoral intramedullary rod. IMPRESSION: 1. Interim finding of moderate ascites within the abdomen and pelvis with additional finding of diffuse nodular infiltration of the omentum and anterior mesenteric fat, the appearance would be consistent with peritoneal carcinomatosis/metastatic disease. Increasing retroperitoneal and upper abdominal adenopathy. 2. Re- demonstrated 3.8 cm cyst in the right adnexa. Enlarging soft tissue density in the left adnexa now with possible cystic component posteriorly. In light of the above  findings, concern is for ovarian neoplasm. Correlation with pelvic ultrasound recommended. 3. Small right-sided pleural effusion, new since prior study 4. Stable hypodense splenic lesions since 2017. Electronically Signed   By: Donavan Foil M.D.   On: 06/24/2017 22:00   US Paracentesis  Result Date: 07/08/2017 INDICATION: Patient with history of omental nodularity, abdominal pain, recurrent malignant ascites; request made for therapeutic paracentesis. EXAM: ULTRASOUND GUIDED THERAPEUTIC PARACENTESIS MEDICATIONS: None. COMPLICATIONS: None immediate. PROCEDURE: Informed written consent was obtained from the patient after a discussion of the risks, benefits and alternatives to treatment. A timeout was performed prior to the initiation of the procedure. Initial ultrasound scanning demonstrates a small to moderate amount of ascites within the right lower abdominal quadrant. The right lower abdomen was prepped and draped in the usual sterile fashion. 2% lidocaine was used for local anesthesia. Following this, a Yueh catheter was introduced. An ultrasound image was saved for documentation purposes. The paracentesis was performed. The catheter was removed and a dressing was applied. The patient tolerated the procedure well without immediate post procedural complication. FINDINGS: A total of approximately 2.7 liters of turbid, light brown fluid was removed. IMPRESSION: Successful ultrasound-guided therapeutic paracentesis yielding 2.7 liters liters of peritoneal fluid. Read by: Rowe Robert, PA-C Electronically Signed   By: Jerilynn Mages.  Shick M.D.   On: 07/08/2017 14:00   US Paracentesis  Result Date: 07/01/2017 INDICATION: Omental nodularity, abdominal pain, ascites, retroperitoneal adenopathy. Request made for diagnostic and therapeutic paracentesis. EXAM: ULTRASOUND GUIDED DIAGNOSTIC AND THERAPEUTIC PARACENTESIS MEDICATIONS: None. COMPLICATIONS: None immediate. PROCEDURE: Informed written consent was obtained from the  patient after a discussion of the risks, benefits and alternatives to treatment. A timeout was performed prior to the initiation of the procedure. Initial ultrasound scanning demonstrates a small to moderate amount of ascites within the left lower abdominal quadrant. The left lower abdomen was prepped and draped in the usual sterile fashion. 2% lidocaine was used for local anesthesia. Following this, a Yueh catheter was introduced. An ultrasound image was saved for documentation purposes. The paracentesis was performed. The catheter was removed and a dressing was applied. The patient tolerated the procedure well without immediate post procedural complication. FINDINGS: A total of approximately 2.5 liters of turbid, light brown fluid was removed. Samples were sent to the laboratory as requested by the clinical team. IMPRESSION: Successful ultrasound-guided diagnostic and therapeutic paracentesis yielding 2.5 liters of peritoneal fluid. Read by: Rowe Robert, PA-C Electronically Signed   By: Markus Daft M.D.   On: 07/01/2017 11:49   Dg Chest Port 1 View  Result Date: 07/07/2017 CLINICAL DATA:  Shortness of breath. EXAM: PORTABLE CHEST 1 VIEW COMPARISON:  06/30/2017 FINDINGS: The cardiomediastinal silhouette is unchanged and within normal limits. Aortic atherosclerosis is noted. There are small bilateral pleural effusions  which appear larger than on the prior study, although this could be due to differences in patient positioning. Patchy opacity is present in the lung bases. No pneumothorax is identified. No acute osseous abnormality is seen. IMPRESSION: Small pleural effusions, likely increased in size with patchy bibasilar atelectasis or pneumonia. Electronically Signed   By: Logan Bores M.D.   On: 07/07/2017 11:24    Microbiology: Recent Results (from the past 240 hour(s))  Culture, body fluid-bottle     Status: None   Collection Time: 07/01/17 11:44 AM  Result Value Ref Range Status   Specimen  Description FLUID PERITONEAL  Final   Special Requests NONE  Final   Culture   Final    NO GROWTH 5 DAYS Performed at [facility], [address], [place], [place] [address]    Report Status 07/06/2017 FINAL  Final  Gram stain     Status: None   Collection Time: 07/01/17 11:44 AM  Result Value Ref Range Status   Specimen Description FLUID PERITONEAL  Final   Special Requests NONE  Final   Gram Stain   Final    RARE WBC PRESENT,BOTH PMN AND MONONUCLEAR NO ORGANISMS SEEN Performed at [facility], [place] [address], [place], [place] [address]    Report Status 07/01/2017 FINAL  Final  Culture, blood (routine x 2) Call MD if unable to obtain prior to antibiotics being given     Status: None (Preliminary result)   Collection Time: 07/07/17  4:48 PM  Result Value Ref Range Status   Specimen Description LEFT ANTECUBITAL  Final   Special Requests   Final    BOTTLES DRAWN AEROBIC AND ANAEROBIC Blood Culture adequate volume   Culture   Final    NO GROWTH < 24 HOURS Performed at [facility], [place] [address] [place], [place] [address]    Report Status PENDING  Incomplete  Culture, blood (routine x 2) Call MD if unable to obtain prior to antibiotics being given     Status: None (Preliminary result)   Collection Time: 07/07/17  4:53 PM  Result Value Ref Range Status   Specimen Description BLOOD RIGHT HAND  Final   Special Requests IN PEDIATRIC BOTTLE Blood Culture adequate volume  Final   Culture   Final    NO GROWTH < 24 HOURS Performed at [facility], [place] [address] [place], [place] [address]    Report Status PENDING  Incomplete     Labs: Basic Metabolic Panel:  Recent Labs Lab 07/07/17 1057 07/08/17 0441 07/09/17 0436  NA 135 134* 136  K 3.7 3.9 3.4*  CL 101 103 103  CO2 25 25 27   GLUCOSE 106* 108* 101*  BUN 7 8 9   CREATININE 0.82 0.68 0.81  CALCIUM 7.9* 7.9* 7.5*   Liver Function Tests:  Recent Labs Lab 07/07/17 1057 07/08/17 0441  AST 22  17  ALT 15 11*  ALKPHOS 46 37*  BILITOT 0.8 0.5  PROT 6.2* 5.3*  ALBUMIN 2.8* 2.4*    Recent Labs Lab 07/07/17 1057  LIPASE 18   No results for input(s): AMMONIA in the last 168 hours. CBC:  Recent Labs Lab 07/07/17 1057 07/08/17 0441 07/09/17 0436  WBC 11.5* 10.6* 8.2  NEUTROABS 9.4*  --   --   HGB 14.8 13.1 13.0  HCT 43.8 39.8 39.1  MCV 86.2 85.0 86.7  PLT 377 335 294   Cardiac Enzymes:  Recent Labs Lab 07/07/17 1057  TROPONINI <  0.03   BNP: BNP (last 3 results)  Recent Labs  07/07/17 1057  BNP 42.9    ProBNP (last 3 results) No results for input(s): PROBNP in the last 8760 hours.  CBG: No results for input(s): GLUCAP in the last 168 hours.

## 2017-07-10 MED ORDER — GUAIFENESIN-DM 100-10 MG/5ML PO SYRP: 5.0000 mL | ORAL_SOLUTION | ORAL | 0 refills | Status: DC | PRN

## 2017-07-10 MED ORDER — GUAIFENESIN-DM 100-10 MG/5ML PO SYRP: 5.0000 mL | ORAL_SOLUTION | ORAL | Status: DC | PRN

## 2017-07-10 NOTE — Care Management Note (Signed)
Case Management Note  Patient Details  Name: [name] MRN: [id number] Date of Birth: December 03, 1932  Subjective/Objective:                    Action/Plan:   Expected Discharge Date:  07/10/17               Expected Discharge Plan:  [place]  In-House Referral:     Discharge planning Services  CM Consult  Post Acute Care Choice:    Choice offered to:     DME Arranged:  Oxygen DME Agency:  [place] Arranged:  RN, PT, NA [place] Agency:  [place]  Status of Service:  Completed, signed off  If discussed at [place] of Stay Meetings, dates discussed:    Additional CommentsPurcell Mouton, RN 07/10/2017, 1:17 PM

## 2017-07-10 NOTE — Progress Notes (Signed)
Please see d/c summary note from 10/2.  No new changes.  Oxygen is delivered and pt ready for d/c home.    Hosie Poisson, MD [phone]

## 2017-07-10 NOTE — Progress Notes (Signed)
Pt selected [facility] for HH needs. Referral given to in house rep.  

## 2017-07-12 ENCOUNTER — Ambulatory Visit (HOSPITAL_COMMUNITY)
Admission: RE | Admit: 2017-07-12 | Discharge: 2017-07-12 | Disposition: A | Discharge status: Home / Self Care | Payer: Medicare Other | Source: Ambulatory Visit | Attending: Internal Medicine | Admitting: Internal Medicine

## 2017-07-12 ENCOUNTER — Encounter (HOSPITAL_COMMUNITY): Payer: Self-pay | Admitting: Radiology

## 2017-07-12 DIAGNOSIS — R188 Other ascites: Secondary | ICD-10-CM | POA: Diagnosis not present

## 2017-07-12 DIAGNOSIS — R180 Malignant ascites: Secondary | ICD-10-CM

## 2017-07-12 HISTORY — PX: IR PARACENTESIS: IMG2679

## 2017-07-12 LAB — CULTURE, BLOOD (ROUTINE X 2)
CULTURE: NO GROWTH
Culture: NO GROWTH
SPECIAL REQUESTS: ADEQUATE
SPECIAL REQUESTS: ADEQUATE

## 2017-07-12 MED ORDER — LIDOCAINE HCL 1 % IJ SOLN
INTRAMUSCULAR | Status: AC
  Filled 2017-07-12: qty 20

## 2017-07-12 MED ORDER — LIDOCAINE HCL 1 % IJ SOLN
INTRAMUSCULAR | Status: DC | PRN
  Administered 2017-07-12: 8 mL

## 2017-07-12 NOTE — Procedures (Signed)
PROCEDURE SUMMARY:  Successful US guided paracentesis from RLQ.  Yielded 1450 mL of cloudy yellow fluid.  No immediate complications.  Pt tolerated well.   Specimen was not requested to be sent for labs.  Ascencion Dike PA-C 07/12/2017 10:40 AM

## 2017-07-16 ENCOUNTER — Encounter: Payer: Self-pay | Admitting: Gynecologic Oncology

## 2017-07-16 ENCOUNTER — Ambulatory Visit (HOSPITAL_BASED_OUTPATIENT_CLINIC_OR_DEPARTMENT_OTHER): Payer: Medicare Other | Admitting: Gynecologic Oncology

## 2017-07-16 VITALS — BP 117/52 | HR 84 | Temp 98.0°F | Resp 20 | Wt 178.3 lb

## 2017-07-16 DIAGNOSIS — C801 Malignant (primary) neoplasm, unspecified: Secondary | ICD-10-CM | POA: Diagnosis not present

## 2017-07-16 DIAGNOSIS — E669 Obesity, unspecified: Secondary | ICD-10-CM | POA: Insufficient documentation

## 2017-07-16 DIAGNOSIS — Z87891 Personal history of nicotine dependence: Secondary | ICD-10-CM | POA: Insufficient documentation

## 2017-07-16 DIAGNOSIS — C800 Disseminated malignant neoplasm, unspecified: Secondary | ICD-10-CM | POA: Insufficient documentation

## 2017-07-16 DIAGNOSIS — F329 Major depressive disorder, single episode, unspecified: Secondary | ICD-10-CM | POA: Insufficient documentation

## 2017-07-16 DIAGNOSIS — K5190 Ulcerative colitis, unspecified, without complications: Secondary | ICD-10-CM | POA: Insufficient documentation

## 2017-07-16 DIAGNOSIS — I480 Paroxysmal atrial fibrillation: Secondary | ICD-10-CM | POA: Insufficient documentation

## 2017-07-16 DIAGNOSIS — J449 Chronic obstructive pulmonary disease, unspecified: Secondary | ICD-10-CM

## 2017-07-16 DIAGNOSIS — R971 Elevated cancer antigen 125 [CA 125]: Secondary | ICD-10-CM

## 2017-07-16 DIAGNOSIS — E039 Hypothyroidism, unspecified: Secondary | ICD-10-CM

## 2017-07-16 DIAGNOSIS — M549 Dorsalgia, unspecified: Secondary | ICD-10-CM | POA: Insufficient documentation

## 2017-07-16 DIAGNOSIS — R0602 Shortness of breath: Secondary | ICD-10-CM | POA: Insufficient documentation

## 2017-07-16 DIAGNOSIS — N839 Noninflammatory disorder of ovary, fallopian tube and broad ligament, unspecified: Secondary | ICD-10-CM | POA: Diagnosis not present

## 2017-07-16 DIAGNOSIS — G8929 Other chronic pain: Secondary | ICD-10-CM

## 2017-07-16 DIAGNOSIS — C786 Secondary malignant neoplasm of retroperitoneum and peritoneum: Secondary | ICD-10-CM | POA: Diagnosis not present

## 2017-07-16 DIAGNOSIS — N83201 Unspecified ovarian cyst, right side: Secondary | ICD-10-CM

## 2017-07-16 DIAGNOSIS — K219 Gastro-esophageal reflux disease without esophagitis: Secondary | ICD-10-CM

## 2017-07-16 DIAGNOSIS — Z8619 Personal history of other infectious and parasitic diseases: Secondary | ICD-10-CM

## 2017-07-16 DIAGNOSIS — K449 Diaphragmatic hernia without obstruction or gangrene: Secondary | ICD-10-CM | POA: Insufficient documentation

## 2017-07-16 DIAGNOSIS — Z79899 Other long term (current) drug therapy: Secondary | ICD-10-CM | POA: Insufficient documentation

## 2017-07-16 DIAGNOSIS — R188 Other ascites: Secondary | ICD-10-CM | POA: Diagnosis not present

## 2017-07-16 DIAGNOSIS — Z7901 Long term (current) use of anticoagulants: Secondary | ICD-10-CM | POA: Insufficient documentation

## 2017-07-16 DIAGNOSIS — C569 Malignant neoplasm of unspecified ovary: Secondary | ICD-10-CM

## 2017-07-16 NOTE — Patient Instructions (Signed)
Plan on having a paracentesis to drain some fluid from your abdomen.  We will also plan for you to meet with Dr. Heath Lark, Medical Oncologist, to discuss and plan for chemotherapy.   We will plan on seeing you after three cycles of chemotherapy for consideration of surgery at that time. Please call for any questions or concerns.

## 2017-07-16 NOTE — Progress Notes (Signed)
Consult Note: Gyn-Onc  Consult was requested by Dr. Lynnette Caffey for the evaluation of Tyreka Henneke Baehr 83 y.o. female  CC:  Chief Complaint  Patient presents with  . Right ovarian cyst  Right ovarian cyst  Assessment/Plan:  Ms. TABBATHA BORDELON  is a 83 y.o.  year old with a 4cm simple right ovarian cyst , left ovarian thickening, ascites and elevated CA 125 with carcinomatosis and positive pelvic paraentesis for adencarcinoma (in the setting of multiple medical comorbidities including obesity, ulcerative colitis, chronic back pain (on steroids), atrial fibrillation on xarelto).  I reviewed her CT images with the patient and her daughter.  I discussed that the right ovarian cyst remains stable and benign appearing however the left ovary is now prominent and this cancer either represents a left ovarian cancer, or a primary peritoneal cancer. It is stage IIIC by virtue of the upper abdominal disease present.  Given her advanced age and multiple serious medical comorbidities, she is not a good candidate for upfront debulking surgery. Instead I am recommending neoadjuvant chemotherapy with carb/tax x 3 cycles followed by reimaging and reconsidration for surgery at that time.  The patient is very symptomatic from ascites and we will set up therapeutic paracentesis later this week as she will likely not begin chemotherapy before next week. We have facilitated consultation with Dr Alvy Bimler. She will also need consultation with genetics.   HPI: Velda Wendt is an 83 year old woman who is seen in consultation at the request of Dr Lynnette Caffey for a 4cm right ovarian cyst and elevated CA 125.  The cyst was first detected on imaging (CT) in 2012 and was approximately 4cm. It was again seen on a CT scan in August, 2017 and was stable in dimension, and described as likely being benign.   Her CA 125 in May 2017 was 45. It was 75 in August, 2017.   She was scheduled for a repeat CA 125 in March, 13, 2018 this  was elevated to 133.5.  The patient denied abdominal bloating or early satiety. She had no vaginal bleeding.  Her major complaint is back pain which has been very bad in the past 6 months and she has been regularly on steroid tapers (most recently completed 2 weeks ago). She has received multiple joint injections for this also.  She takes Xarelto for atrial fibrillation. She has ulcerative colitis. Her bouts have been less prominent lately, likely secondary to prednisone use for her back.  Interval Hx: CT scan on 02/07/17 was ordered due to the rising CA 125. It showed a normal appearance of uterus. A simple appearing cystic lesion is again seen in the right adnexa which measures 3.8 x 3.7cm. This remains stable since 2012, consistent with benign etiology. No other masses or inflammatory process identified.  Given her serious medical comorbidities, surgery was not performed for what appeared to be consistent with a benign lesion as the risk were felt to outweigh the benefits.  In August, 2018 she began experiencing increasing abdominal distension and discomfort. On 07/07/17 she was admitted to [facility] for abdominal distension and SOB. CT imaging showed Interim finding of moderate ascites within the abdomen and pelvis with additional finding of diffuse nodular infiltration of the omentum and anterior mesenteric fat, the appearance would be consistent with peritoneal carcinomatosis/metastatic disease. Increasing retroperitoneal and upper abdominal adenopathy. Re- demonstrated 3.8 cm cyst in the right adnexa. Enlarging soft tissue density in the left adnexa now with possible cystic component posteriorly. In light  of the above findings, concern is for ovarian neoplasm. Correlation with pelvic ultrasound recommended. Small right-sided pleural effusion, new since prior study Stable hypodense splenic lesions since 2017.  US guided paracentesis was performed on 07/01/17 which showed adenocarcinoma,  favor gynecologic primary.  She has had multiple paracenteses since that time because her ascites continues to accumulate and is symptomatic making it difficult to breath. She now requires O2 supplementation.  Current Meds:  Outpatient Encounter Prescriptions as of 07/16/2017  Medication Sig  . albuterol (PROAIR HFA) 108 (90 Base) MCG/ACT inhaler Inhale 2 puffs into the lungs every 6 (six) hours as needed for wheezing or shortness of breath.  . ALPRAZolam (XANAX) 0.25 MG tablet Take 0.25 mg by mouth at bedtime as needed for anxiety.  Marland Kitchen azelastine (ASTELIN) 0.1 % nasal spray Place 2 sprays into both nostrils 2 (two) times daily. Use in each nostril as directed  . budesonide-formoterol (SYMBICORT) 160-4.5 MCG/ACT inhaler Inhale 2 puffs into the lungs 2 (two) times daily.  . Cholecalciferol (VITAMIN D) 2000 units CAPS Take 2,000 Units by mouth daily.  Marland Kitchen diltiazem (CARTIA XT) 120 MG 24 hr capsule TAKE 1 CAPSULE BY MOUTH EVERY DAY  . diphenoxylate-atropine (LOMOTIL) 2.5-0.025 MG tablet Take 1 tablet by mouth 4 (four) times daily. (Patient taking differently: Take 1 tablet by mouth 4 (four) times daily as needed for diarrhea or loose stools. )  . fluticasone (FLONASE) 50 MCG/ACT nasal spray Place 2 sprays into both nostrils 2 (two) times daily. For nasal congestion  . furosemide (LASIX) 20 MG tablet Take 1 tablet (20 mg total) by mouth every other day.  Marland Kitchen guaiFENesin-dextromethorphan (ROBITUSSIN DM) 100-10 MG/5ML syrup Take 5 mLs by mouth every 4 (four) hours as needed for cough.  . levothyroxine (SYNTHROID, LEVOTHROID) 137 MCG tablet Take 137 mcg by mouth every morning. For thyroid therapy  . MAGNESIUM GLYCINATE PLUS PO Take 400 mg by mouth daily.  . methylcellulose (ARTIFICIAL TEARS) 1 % ophthalmic solution Place 1 drop into both eyes 2 (two) times daily as needed. Dry eyes  . prochlorperazine (COMPAZINE) 10 MG tablet Take 10 mg by mouth every 6 (six) hours as needed for nausea.  Alveda Reasons 20 MG TABS  tablet TAKE 1 TABLET BY MOUTH EVERY DAY WITH SUPPER   No facility-administered encounter medications on file as of 07/16/2017.     Allergy:  Allergies  Allergen Reactions  . Codeine Itching    Social Hx:   Social History   Social History  . Marital status: Divorced    Spouse name: N/A  . Number of children: 1  . Years of education: 74   Occupational History  . retired Retired   Social History Main Topics  . Smoking status: Former Smoker    Packs/day: 1.00    Years: 20.00    Types: Cigarettes    Quit date: 10/08/1980  . Smokeless tobacco: Never Used  . Alcohol use Yes     Comment: rarely, 12-23-15 rarely  . Drug use: No  . Sexual activity: Not Currently   Other Topics Concern  . Not on file   Social History Narrative   Patient lives at home by herself.    Patient is retired.    Patient has 12 th grade education.           Past Surgical Hx:  Past Surgical History:  Procedure Laterality Date  . CHOLECYSTECTOMY  Dec 04, 2010  . FEMUR IM NAIL Left 12/11/2013   Procedure: INTRAMEDULLARY (IM) NAIL FEMORAL;  Surgeon: [name], MD;  Location: [facility];  Service: Orthopedics;  Laterality: Left;  . FRACTURE SURGERY Left 12/11/2013   hip  . IR PARACENTESIS  07/12/2017  . KNEE SURGERY Left    x 2  . TOTAL KNEE REVISION  08/06/2012   Procedure: TOTAL KNEE REVISION;  Surgeon: [name], MD;  Location: [facility];  Service: Orthopedics;  Laterality: Left;  Left Total Knee Arthroplasty Revision  . Uterine polypectomy      Past Medical Hx:  Past Medical History:  Diagnosis Date  . COPD (chronic obstructive pulmonary disease) ([place])   . Depression   . Diverticulosis   . Family hx of colon cancer   . Fibromyalgia   . GERD (gastroesophageal reflux disease)   . Hemorrhoids   . Hiatal hernia   . History of shingles   . Hypothyroidism   . IBS (irritable bowel syndrome)   . Lymphocytic colitis   . Osteoporosis   . Paroxysmal atrial fibrillation ([facility])   . Psoriatic  arthritis ([place])   . Rectal polyp   . Schatzki's ring   . Uterine polyp     Past Gynecological History:   No LMP recorded. Patient is postmenopausal.  Family Hx:  Family History  Problem Relation Age of Onset  . Heart disease Father        pacemaker  . Lung cancer Father        hx of smoker  . Colon cancer Mother        dx in her mid 44's  . Colon cancer Maternal Grandfather   . Depression Sister     Review of Systems:  Constitutional  Feels well,    ENT Normal appearing ears and nares bilaterally Skin/Breast  No rash, sores, jaundice, itching, dryness Cardiovascular  +shortness of breath Pulmonary  Needs O2 Gastro Intestinal  No nausea, vomitting, or diarrhoea. No bright red blood per rectum, no change in bowel movement, or constipation. + distension of abdomen. Occasional pains. Genito Urinary  No frequency, urgency, dysuria, no bleeding Musculo Skeletal  + back pain Neurologic  No weakness, numbness, change in gait,  Psychology  No depression, anxiety, insomnia.   Vitals:  Blood pressure (!) 117/52, pulse 84, temperature 98 F (36.7 C), temperature source Oral, resp. rate 20, weight 178 lb 4.8 oz (80.9 kg), SpO2 98 %.  Physical Exam: WD in NAD, on O2 Neck  Supple NROM, without any enlargements.  Lymph Node Survey No cervical supraclavicular or inguinal adenopathy Cardiovascular  Pulse normal rate, regularity and rhythm. S1 and S2 normal.  Lungs: Decreased BS bibasally Skin  No rash/lesions/breakdown  Psychiatry  Alert and oriented to person, place, and time  Abdomen  Normoactive bowel sounds, abdomen soft, non-tender and obese without evidence of hernia. Distended, dull to percuss with ascites. Back No CVA tenderness Genito Urinary  Vulva/vagina: Normal external female genitalia.  No lesions. No discharge or bleeding.  Bladder/urethra:  No lesions or masses, well supported bladder  Vagina: normal  Cervix: Normal appearing, no lesions.  Uterus:   Small, mobile, no parametrial involvement or nodularity.  Adnexa: no palpable masses. Rectal  deferred Extremities  No bilateral cyanosis, clubbing or edema.   Donaciano Eva, MD  07/16/2017, 6:00 PM

## 2017-07-17 ENCOUNTER — Telehealth: Payer: Self-pay | Admitting: *Deleted

## 2017-07-17 NOTE — Telephone Encounter (Signed)
Per staff message from Dr. Alvy Bimler, scheduled the patient for a new patient appt on Thursday October 11th at 12:30pm. Patient contacted and aware of the appt.

## 2017-07-18 ENCOUNTER — Encounter (HOSPITAL_COMMUNITY): Payer: Self-pay | Admitting: *Deleted

## 2017-07-18 ENCOUNTER — Encounter: Payer: Medicare Other | Admitting: Hematology and Oncology

## 2017-07-18 ENCOUNTER — Inpatient Hospital Stay (HOSPITAL_COMMUNITY)
Admission: AD | Admit: 2017-07-18 | Discharge: 2017-07-24 | DRG: 754 | Disposition: A | Discharge status: Skilled Nursing Facility | Payer: Medicare Other | Source: Ambulatory Visit | Attending: Hematology and Oncology | Admitting: Hematology and Oncology

## 2017-07-18 ENCOUNTER — Encounter: Payer: Self-pay | Admitting: Hematology and Oncology

## 2017-07-18 VITALS — BP 131/58 | HR 97 | Temp 97.6°F | Resp 24 | Ht 64.0 in | Wt 181.1 lb

## 2017-07-18 DIAGNOSIS — Z5111 Encounter for antineoplastic chemotherapy: Secondary | ICD-10-CM

## 2017-07-18 DIAGNOSIS — T451X5A Adverse effect of antineoplastic and immunosuppressive drugs, initial encounter: Secondary | ICD-10-CM | POA: Diagnosis present

## 2017-07-18 DIAGNOSIS — R180 Malignant ascites: Secondary | ICD-10-CM | POA: Diagnosis present

## 2017-07-18 DIAGNOSIS — G4701 Insomnia due to medical condition: Secondary | ICD-10-CM

## 2017-07-18 DIAGNOSIS — M797 Fibromyalgia: Secondary | ICD-10-CM | POA: Diagnosis present

## 2017-07-18 DIAGNOSIS — E039 Hypothyroidism, unspecified: Secondary | ICD-10-CM | POA: Diagnosis present

## 2017-07-18 DIAGNOSIS — C561 Malignant neoplasm of right ovary: Secondary | ICD-10-CM

## 2017-07-18 DIAGNOSIS — C569 Malignant neoplasm of unspecified ovary: Secondary | ICD-10-CM | POA: Diagnosis not present

## 2017-07-18 DIAGNOSIS — K5900 Constipation, unspecified: Secondary | ICD-10-CM | POA: Diagnosis present

## 2017-07-18 DIAGNOSIS — J9690 Respiratory failure, unspecified, unspecified whether with hypoxia or hypercapnia: Secondary | ICD-10-CM | POA: Diagnosis present

## 2017-07-18 DIAGNOSIS — C562 Malignant neoplasm of left ovary: Secondary | ICD-10-CM | POA: Diagnosis present

## 2017-07-18 DIAGNOSIS — R5383 Other fatigue: Secondary | ICD-10-CM | POA: Diagnosis present

## 2017-07-18 DIAGNOSIS — R05 Cough: Secondary | ICD-10-CM | POA: Diagnosis not present

## 2017-07-18 DIAGNOSIS — J90 Pleural effusion, not elsewhere classified: Secondary | ICD-10-CM | POA: Diagnosis present

## 2017-07-18 DIAGNOSIS — R112 Nausea with vomiting, unspecified: Secondary | ICD-10-CM | POA: Diagnosis present

## 2017-07-18 DIAGNOSIS — K5909 Other constipation: Secondary | ICD-10-CM

## 2017-07-18 DIAGNOSIS — M898X9 Other specified disorders of bone, unspecified site: Secondary | ICD-10-CM

## 2017-07-18 DIAGNOSIS — R110 Nausea: Secondary | ICD-10-CM

## 2017-07-18 DIAGNOSIS — G4700 Insomnia, unspecified: Secondary | ICD-10-CM | POA: Diagnosis present

## 2017-07-18 DIAGNOSIS — R188 Other ascites: Secondary | ICD-10-CM

## 2017-07-18 DIAGNOSIS — D61810 Antineoplastic chemotherapy induced pancytopenia: Secondary | ICD-10-CM | POA: Diagnosis present

## 2017-07-18 DIAGNOSIS — Z87891 Personal history of nicotine dependence: Secondary | ICD-10-CM | POA: Diagnosis not present

## 2017-07-18 DIAGNOSIS — Z96652 Presence of left artificial knee joint: Secondary | ICD-10-CM | POA: Diagnosis present

## 2017-07-18 DIAGNOSIS — J441 Chronic obstructive pulmonary disease with (acute) exacerbation: Secondary | ICD-10-CM | POA: Diagnosis present

## 2017-07-18 DIAGNOSIS — Z7982 Long term (current) use of aspirin: Secondary | ICD-10-CM

## 2017-07-18 DIAGNOSIS — R531 Weakness: Secondary | ICD-10-CM

## 2017-07-18 DIAGNOSIS — I4891 Unspecified atrial fibrillation: Secondary | ICD-10-CM | POA: Diagnosis not present

## 2017-07-18 DIAGNOSIS — I482 Chronic atrial fibrillation, unspecified: Secondary | ICD-10-CM

## 2017-07-18 DIAGNOSIS — R140 Abdominal distension (gaseous): Secondary | ICD-10-CM | POA: Diagnosis present

## 2017-07-18 DIAGNOSIS — T380X5A Adverse effect of glucocorticoids and synthetic analogues, initial encounter: Secondary | ICD-10-CM | POA: Diagnosis not present

## 2017-07-18 DIAGNOSIS — R5381 Other malaise: Secondary | ICD-10-CM | POA: Diagnosis not present

## 2017-07-18 DIAGNOSIS — R1084 Generalized abdominal pain: Secondary | ICD-10-CM | POA: Diagnosis present

## 2017-07-18 DIAGNOSIS — M6281 Muscle weakness (generalized): Secondary | ICD-10-CM

## 2017-07-18 DIAGNOSIS — R059 Cough, unspecified: Secondary | ICD-10-CM

## 2017-07-18 DIAGNOSIS — Z79899 Other long term (current) drug therapy: Secondary | ICD-10-CM

## 2017-07-18 DIAGNOSIS — R109 Unspecified abdominal pain: Secondary | ICD-10-CM

## 2017-07-18 DIAGNOSIS — J449 Chronic obstructive pulmonary disease, unspecified: Secondary | ICD-10-CM | POA: Diagnosis present

## 2017-07-18 DIAGNOSIS — K219 Gastro-esophageal reflux disease without esophagitis: Secondary | ICD-10-CM | POA: Diagnosis present

## 2017-07-18 DIAGNOSIS — L4050 Arthropathic psoriasis, unspecified: Secondary | ICD-10-CM | POA: Diagnosis present

## 2017-07-18 DIAGNOSIS — J438 Other emphysema: Secondary | ICD-10-CM

## 2017-07-18 DIAGNOSIS — M810 Age-related osteoporosis without current pathological fracture: Secondary | ICD-10-CM | POA: Diagnosis present

## 2017-07-18 DIAGNOSIS — R197 Diarrhea, unspecified: Secondary | ICD-10-CM | POA: Diagnosis present

## 2017-07-18 DIAGNOSIS — K591 Functional diarrhea: Secondary | ICD-10-CM

## 2017-07-18 DIAGNOSIS — R058 Other specified cough: Secondary | ICD-10-CM

## 2017-07-18 DIAGNOSIS — R1010 Upper abdominal pain, unspecified: Secondary | ICD-10-CM | POA: Diagnosis present

## 2017-07-18 LAB — COMPREHENSIVE METABOLIC PANEL
ALBUMIN: 2.5 g/dL — AB (ref 3.5–5.0)
ALK PHOS: 64 U/L (ref 38–126)
ALT: 25 U/L (ref 14–54)
ANION GAP: 8 (ref 5–15)
AST: 24 U/L (ref 15–41)
BILIRUBIN TOTAL: 0.4 mg/dL (ref 0.3–1.2)
BUN: 12 mg/dL (ref 6–20)
CALCIUM: 8 mg/dL — AB (ref 8.9–10.3)
CO2: 27 mmol/L (ref 22–32)
CREATININE: 0.85 mg/dL (ref 0.44–1.00)
Chloride: 101 mmol/L (ref 101–111)
GFR calc non Af Amer: >60 mL/min (ref >60)
GLUCOSE: 148 mg/dL — AB (ref 65–99)
Potassium: 3.6 mmol/L (ref 3.5–5.1)
Sodium: 136 mmol/L (ref 135–145)
TOTAL PROTEIN: 5.8 g/dL — AB (ref 6.5–8.1)

## 2017-07-18 LAB — CBC WITH DIFFERENTIAL/PLATELET
BASOS ABS: 0 10*3/uL (ref 0.0–0.1)
BASOS PCT: 0 %
EOS ABS: 0.2 10*3/uL (ref 0.0–0.7)
Eosinophils Relative: 2 %
HEMATOCRIT: 43.5 % (ref 36.0–46.0)
Hemoglobin: 14.3 g/dL (ref 12.0–15.0)
Lymphocytes Relative: 13 %
Lymphs Abs: 1.7 10*3/uL (ref 0.7–4.0)
MCH: 29.1 pg (ref 26.0–34.0)
MCHC: 32.9 g/dL (ref 30.0–36.0)
MCV: 88.4 fL (ref 78.0–100.0)
MONO ABS: 1.1 10*3/uL — AB (ref 0.1–1.0)
Monocytes Relative: 9 %
NEUTROS ABS: 9.7 10*3/uL — AB (ref 1.7–7.7)
Neutrophils Relative %: 76 %
PLATELETS: 272 10*3/uL (ref 150–400)
RBC: 4.92 MIL/uL (ref 3.87–5.11)
RDW: 15.3 % (ref 11.5–15.5)
WBC: 12.7 10*3/uL — ABNORMAL HIGH (ref 4.0–10.5)

## 2017-07-18 LAB — PHOSPHORUS: Phosphorus: 4.1 mg/dL (ref 2.5–4.6)

## 2017-07-18 LAB — MAGNESIUM: Magnesium: 1.9 mg/dL (ref 1.7–2.4)

## 2017-07-18 MED ORDER — ENOXAPARIN SODIUM 40 MG/0.4ML ~~LOC~~ SOLN
40.0000 mg | SUBCUTANEOUS | Status: DC
  Administered 2017-07-18 – 2017-07-20 (×3): 40 mg via SUBCUTANEOUS
  Filled 2017-07-18 (×3): qty 0.4

## 2017-07-18 MED ORDER — ONDANSETRON 4 MG PO TBDP: 4.0000 mg | ORAL_TABLET | Freq: Three times a day (TID) | ORAL | Status: DC | PRN

## 2017-07-18 MED ORDER — AZELASTINE HCL 0.1 % NA SOLN
2.0000 | Freq: Two times a day (BID) | NASAL | Status: DC
  Administered 2017-07-18 – 2017-07-24 (×11): 2 via NASAL
  Filled 2017-07-18: qty 30

## 2017-07-18 MED ORDER — DEXAMETHASONE 4 MG PO TABS
20.0000 mg | ORAL_TABLET | Freq: Once | ORAL | Status: AC
  Administered 2017-07-19: 20 mg via ORAL
  Filled 2017-07-18: qty 5

## 2017-07-18 MED ORDER — ONDANSETRON HCL 4 MG/2ML IJ SOLN: 4.0000 mg | Freq: Three times a day (TID) | INTRAMUSCULAR | Status: DC | PRN

## 2017-07-18 MED ORDER — PROCHLORPERAZINE MALEATE 10 MG PO TABS: 10.0000 mg | ORAL_TABLET | Freq: Four times a day (QID) | ORAL | Status: DC | PRN

## 2017-07-18 MED ORDER — MOMETASONE FURO-FORMOTEROL FUM 200-5 MCG/ACT IN AERO
2.0000 | INHALATION_SPRAY | Freq: Two times a day (BID) | RESPIRATORY_TRACT | Status: DC
  Administered 2017-07-18 – 2017-07-24 (×12): 2 via RESPIRATORY_TRACT
  Filled 2017-07-18: qty 8.8

## 2017-07-18 MED ORDER — VITAMIN D 1000 UNITS PO TABS
2000.0000 [IU] | ORAL_TABLET | Freq: Every day | ORAL | Status: DC
  Administered 2017-07-19 – 2017-07-24 (×6): 2000 [IU] via ORAL
  Filled 2017-07-18 (×7): qty 2

## 2017-07-18 MED ORDER — ACETAMINOPHEN 325 MG PO TABS
650.0000 mg | ORAL_TABLET | ORAL | Status: DC | PRN
  Administered 2017-07-23 (×2): 650 mg via ORAL
  Filled 2017-07-18 (×2): qty 2

## 2017-07-18 MED ORDER — GUAIFENESIN-DM 100-10 MG/5ML PO SYRP
5.0000 mL | ORAL_SOLUTION | ORAL | Status: DC | PRN
  Administered 2017-07-19 – 2017-07-21 (×4): 5 mL via ORAL
  Filled 2017-07-18 (×4): qty 10

## 2017-07-18 MED ORDER — DILTIAZEM HCL ER COATED BEADS 120 MG PO CP24
120.0000 mg | ORAL_CAPSULE | Freq: Every day | ORAL | Status: DC
  Administered 2017-07-18 – 2017-07-23 (×6): 120 mg via ORAL
  Filled 2017-07-18 (×7): qty 1

## 2017-07-18 MED ORDER — DIPHENOXYLATE-ATROPINE 2.5-0.025 MG PO TABS
1.0000 | ORAL_TABLET | Freq: Four times a day (QID) | ORAL | Status: DC
  Administered 2017-07-18 – 2017-07-24 (×16): 1 via ORAL
  Filled 2017-07-18 (×18): qty 1

## 2017-07-18 MED ORDER — POLYVINYL ALCOHOL 1.4 % OP SOLN: 1.0000 [drp] | OPHTHALMIC | Status: DC | PRN

## 2017-07-18 MED ORDER — DEXAMETHASONE 4 MG PO TABS
20.0000 mg | ORAL_TABLET | Freq: Once | ORAL | Status: AC
  Administered 2017-07-18: 20 mg via ORAL
  Filled 2017-07-18: qty 5

## 2017-07-18 MED ORDER — FLUTICASONE PROPIONATE 50 MCG/ACT NA SUSP
2.0000 | Freq: Two times a day (BID) | NASAL | Status: DC
  Administered 2017-07-18 – 2017-07-24 (×11): 2 via NASAL
  Filled 2017-07-18: qty 16

## 2017-07-18 MED ORDER — ZOLPIDEM TARTRATE 5 MG PO TABS
5.0000 mg | ORAL_TABLET | Freq: Every evening | ORAL | Status: DC | PRN
  Administered 2017-07-23: 5 mg via ORAL
  Filled 2017-07-18: qty 1

## 2017-07-18 MED ORDER — ALBUTEROL SULFATE (2.5 MG/3ML) 0.083% IN NEBU
2.5000 mg | INHALATION_SOLUTION | Freq: Four times a day (QID) | RESPIRATORY_TRACT | Status: DC | PRN
  Administered 2017-07-20 – 2017-07-21 (×2): 2.5 mg via RESPIRATORY_TRACT
  Filled 2017-07-18 (×2): qty 3

## 2017-07-18 MED ORDER — SODIUM CHLORIDE 0.9 % IV SOLN
8.0000 mg | Freq: Three times a day (TID) | INTRAVENOUS | Status: DC | PRN
  Filled 2017-07-18: qty 4

## 2017-07-18 MED ORDER — METHYLCELLULOSE 1 % OP SOLN: 1.0000 [drp] | Freq: Two times a day (BID) | OPHTHALMIC | Status: DC | PRN

## 2017-07-18 MED ORDER — LEVOTHYROXINE SODIUM 25 MCG PO TABS
137.0000 ug | ORAL_TABLET | Freq: Every day | ORAL | Status: DC
  Administered 2017-07-19 – 2017-07-24 (×6): 137 ug via ORAL
  Filled 2017-07-18 (×6): qty 1

## 2017-07-18 MED ORDER — ONDANSETRON HCL 4 MG PO TABS: 4.0000 mg | ORAL_TABLET | Freq: Three times a day (TID) | ORAL | Status: DC | PRN

## 2017-07-18 MED ORDER — ALBUTEROL SULFATE HFA 108 (90 BASE) MCG/ACT IN AERS: 2.0000 | INHALATION_SPRAY | Freq: Four times a day (QID) | RESPIRATORY_TRACT | Status: DC | PRN

## 2017-07-18 MED ORDER — ASPIRIN EC 81 MG PO TBEC
81.0000 mg | DELAYED_RELEASE_TABLET | Freq: Every day | ORAL | Status: DC
  Administered 2017-07-18 – 2017-07-22 (×5): 81 mg via ORAL
  Filled 2017-07-18 (×5): qty 1

## 2017-07-18 MED ORDER — SENNOSIDES-DOCUSATE SODIUM 8.6-50 MG PO TABS: 1.0000 | ORAL_TABLET | Freq: Every evening | ORAL | Status: DC | PRN

## 2017-07-18 MED ORDER — ALPRAZOLAM 0.25 MG PO TABS
0.2500 mg | ORAL_TABLET | Freq: Every evening | ORAL | Status: DC | PRN
  Administered 2017-07-18 – 2017-07-23 (×5): 0.25 mg via ORAL
  Filled 2017-07-18 (×5): qty 1

## 2017-07-18 NOTE — Progress Notes (Signed)
This encounter was created in error - please disregard.

## 2017-07-18 NOTE — H&P (Signed)
[place] ADMISSION NOTE  Patient Care Team: Asencion Noble, MD as PCP - General (Internal Medicine)  CHIEF COMPLAINTS/PURPOSE OF ADMISSION Locally advanced ovarian cancer, complicated by various multiple comorbidities, admitted for symptom management and to expedite treatment  HISTORY OF PRESENTING ILLNESS:  [name] 83 y.o. female is admitted for symptom management and inpatient chemotherapy  She was seen in the outpatient clinic after urgent request for evaluation and treatment for locally advanced ovarian cancer. She is accompanied by her daughter, [name] today I have reviewed her records extensively. The patient was known to have abnormal adnexal mass noted on imaging study for some time She was followed by her gynecologist Serial tumor markers last year has been mildly elevated. The patient was placed on active surveillance Approximately 4-6 months ago, she had progressive abdominal distention, bloating, progressive shortness of breath, reduced appetite, early satiety and nausea She had extensive evaluation and recurrent admission over the past month.  Summary of oncologic history as follows:   Ovarian CA, right ([place])   02/18/2016 Tumor Marker    Patient's tumor was tested for the following markers: CA125 Results of the tumor marker test revealed 45      05/22/2016 Tumor Marker    Patient's tumor was tested for the following markers: CA125 Results of the tumor marker test revealed 53      05/22/2016 Imaging    Outside pelvic US showed 4.1 cm adnexa mass      06/24/2017 Imaging    Ct abdomen and pelvis:  1. Interim finding of moderate ascites within the abdomen and pelvis with additional finding of diffuse nodular infiltration of the omentum and anterior mesenteric fat, the appearance would be consistent with peritoneal carcinomatosis/metastatic disease. Increasing retroperitoneal and upper abdominal adenopathy. 2. Re- demonstrated 3.8 cm cyst in the  right adnexa. Enlarging soft tissue density in the left adnexa now with possible cystic component posteriorly. In light of the above findings, concern is for ovarian neoplasm. Correlation with pelvic ultrasound recommended. 3. Small right-sided pleural effusion, new since prior study 4. Stable hypodense splenic lesions since 2017.       06/25/2017 Imaging    US pelvis: 2.9 cm simple appearing cyst in the right ovary. Left ovary grossly unremarkable. Large volume ascites in the pelvis      07/01/2017 Pathology Results    PERITONEAL/ASCITIC FLUID(SPECIMEN 1 OF 1 COLLECTED 07/01/17): - POORLY DIFFERENTIATED CARCINOMA; SEE COMMENT Source Peritoneal/Ascitic Fluid, (specimen 1 of 1 collected 07/01/17) Gross Specimen: Received is/are 1000 cc's of brownish fluid. (BS:bs) Prepared: # Smears: 0 # Concentration Technique Slides (i.e. ThinPrep): 1 # Cell Block: 1 Additional Studies: Also received Hematology slide - [id number]. Comment The tumor cells are positive for cytokeratin 7 and Pax-8 but negative for cytokeratin 20, CDX-2, GATA-3, Napsin-A and TTF-1. Based on the immunoprofile a gynecology primary is favored      07/01/2017 Procedure    Successful ultrasound-guided diagnostic and therapeutic paracentesis yielding 2.5 liters of peritoneal fluid      07/08/2017 Procedure    Successful ultrasound-guided therapeutic paracentesis yielding 2.7 liters liters of peritoneal fluid      07/12/2017 Procedure    Successful ultrasound-guided paracentesis yielding 1450 mL of peritoneal fluid      The patient is very debilitated.  She has acute on chronic respiratory failure secondary to COPD.  Acute exacerbation of her respiratory failure was related to difficulties taking a deep breath due to abdominal swelling She had at least 3 separate diagnostic and therapeutic paracentesis  lately.  She is scheduled for another paracentesis tomorrow She denies vaginal bleeding She have intermittent abdominal pain  that comes and goes She has early satiety She has unspecified weight loss Her weight is inaccurate due to weight gain from ascites She had chronic nausea but no vomiting  The patient had history of symptomatic microscopic colitis.  She is taking Lomotil on a regular basis and she has intermittent doses of steroid through the GI service She takes chronic anticoagulation therapy with Xarelto due to atrial fibrillation She denies history of stroke The patient denies any recent signs or symptoms of bleeding such as spontaneous epistaxis, hematuria or hematochezia. She had recent abnormal chest x-ray and was told she might have pneumonia but she is currently not on antibiotics She have chronic cough secondary to COPD, generally nonproductive She denies recent fever or chills She had problem with Insomnia She is very debilitated She lives alone.  She is at high risk of fall Her daughter cannot take care of her due to her disabled child at home. She has progressive weakness.   MEDICAL HISTORY:  Past Medical History:  Diagnosis Date  . COPD (chronic obstructive pulmonary disease) ([place])   . Depression   . Diverticulosis   . Family hx of colon cancer   . Fibromyalgia   . GERD (gastroesophageal reflux disease)   . Hemorrhoids   . Hiatal hernia   . History of shingles   . Hypothyroidism   . IBS (irritable bowel syndrome)   . Lymphocytic colitis   . Osteoporosis   . Paroxysmal atrial fibrillation ([facility])   . Psoriatic arthritis ([place])   . Rectal polyp   . Schatzki's ring   . Uterine polyp     SURGICAL HISTORY: Past Surgical History:  Procedure Laterality Date  . CHOLECYSTECTOMY  Dec 04, 2010  . COLONOSCOPY    . FEMUR IM NAIL Left 12/11/2013   Procedure: INTRAMEDULLARY (IM) NAIL FEMORAL;  Surgeon: [name], MD;  Location: [facility];  Service: Orthopedics;  Laterality: Left;  . FRACTURE SURGERY Left 12/11/2013   hip  . IR PARACENTESIS  07/12/2017  . KNEE SURGERY Left    x 2  .  TOTAL KNEE REVISION  08/06/2012   Procedure: TOTAL KNEE REVISION;  Surgeon: [name], MD;  Location: [facility];  Service: Orthopedics;  Laterality: Left;  Left Total Knee Arthroplasty Revision  . Uterine polypectomy      SOCIAL HISTORY: Social History   Social History  . Marital status: Divorced    Spouse name: N/A  . Number of children: 1  . Years of education: 62   Occupational History  . retired Retired   Social History Main Topics  . Smoking status: Former Smoker    Packs/day: 1.00    Years: 20.00    Types: Cigarettes    Quit date: 10/08/1980  . Smokeless tobacco: Never Used  . Alcohol use Yes     Comment: rarely, 12-23-15 rarely  . Drug use: No  . Sexual activity: Not Currently   Other Topics Concern  . Not on file   Social History Narrative   Patient lives at home by herself.    Patient is retired.    Patient has 12 th grade education.           FAMILY HISTORY: Family History  Problem Relation Age of Onset  . Heart disease Father        pacemaker  . Lung cancer Father  hx of smoker  . Colon cancer Mother        dx in her mid 9's  . Colon cancer Maternal Grandfather   . Depression Sister     ALLERGIES:  is allergic to codeine.  MEDICATIONS:  Current Facility-Administered Medications  Medication Dose Route Frequency Provider Last Rate Last Dose  . acetaminophen (TYLENOL) tablet 650 mg  650 mg Oral Q4H PRN Gila Lauf, MD      . albuterol (PROVENTIL HFA;VENTOLIN HFA) 108 (90 Base) MCG/ACT inhaler 2 puff  2 puff Inhalation Q6H PRN Alvy Bimler,  [place], MD      . ALPRAZolam Duanne Moron) tablet 0.25 mg  0.25 mg Oral QHS PRN Alvy Bimler, Letia Guidry, MD      . aspirin EC tablet 81 mg  81 mg Oral Daily Kiven Vangilder, MD      . azelastine (ASTELIN) 0.1 % nasal spray 2 spray  2 spray Each Nare BID Alvy Bimler, Angelik Walls, MD      . diltiazem (CARDIZEM CD) 24 hr capsule 120 mg  120 mg Oral Daily Asiyah Pineau, MD      . diphenoxylate-atropine (LOMOTIL) 2.5-0.025 MG per tablet 1 tablet  1  tablet Oral QID , Timberlynn Kizziah, MD      . enoxaparin (LOVENOX) injection 40 mg  40 mg Subcutaneous Q24H , Lizzet Hendley, MD      . fluticasone (FLONASE) 50 MCG/ACT nasal spray 2 spray  2 spray Each Nare BID , Jasper Ruminski, MD      . guaiFENesin-dextromethorphan (ROBITUSSIN DM) 100-10 MG/5ML syrup 5 mL  5 mL Oral Q4H PRN Clotile Whittington, MD      . levothyroxine (SYNTHROID, LEVOTHROID) tablet 137 mcg  137 mcg Oral q morning - 10a Laraine Samet, MD      . methylcellulose (ARTIFICIAL TEARS) 1 % ophthalmic solution 1 drop  1 drop Both Eyes BID PRN Alvy Bimler, Kayla Deshaies, MD      . mometasone-formoterol (DULERA) 200-5 MCG/ACT inhaler 2 puff  2 puff Inhalation BID Alvy Bimler, Justun Anaya, MD      . ondansetron (ZOFRAN) tablet 4-8 mg  4-8 mg Oral Q8H PRN Alvy Bimler, Yuriel Lopezmartinez, MD       Or  . ondansetron (ZOFRAN-ODT) disintegrating tablet 4-8 mg  4-8 mg Oral Q8H PRN Alvy Bimler, , MD       Or  . ondansetron (ZOFRAN) injection 4 mg  4 mg Intravenous Q8H PRN Kennadee Walthour, MD       Or  . ondansetron (ZOFRAN) 8 mg in sodium chloride 0.9 % 50 mL IVPB  8 mg Intravenous Q8H PRN Alvy Bimler, , MD      . prochlorperazine (COMPAZINE) tablet 10 mg  10 mg Oral Q6H PRN Alvy Bimler, , MD      . senna-docusate (Senokot-S) tablet 1 tablet  1 tablet Oral QHS PRN Heath Lark, MD      . Vitamin D CAPS 2,000 Units  2,000 Units Oral Daily Fayelynn Distel, MD        REVIEW OF SYSTEMS:   Constitutional: Denies fevers, chills or abnormal night sweats Eyes: Denies blurriness of vision, double vision or watery eyes Ears, nose, mouth, throat, and face: Denies mucositis or sore throat Cardiovascular: Denies palpitation, chest discomfort or lower extremity swelling Skin: Denies abnormal skin rashes Lymphatics: Denies new lymphadenopathy or easy bruising Behavioral/Psych: Mood is stable, no new changes  All other systems were reviewed with the patient and are negative.  PHYSICAL EXAMINATION: ECOG PERFORMANCE STATUS: 2 - Symptomatic, <50% confined to bed Weight 181.1 Blood pressure  131/58 Heart rate 97 Respiratory  rate 24 Temperature 97.72F Oxygen saturation 98% on 2 L continuous oxygen via nasal cannula GENERAL:alert, no distress and comfortable.  She is sitting on the wheelchair.  She has oxygen via nasal cannula She looks very debilitated and elderly SKIN: skin color, texture, turgor are normal, no rashes or significant lesions EYES: normal, conjunctiva are pink and non-injected, sclera clear OROPHARYNX:no exudate, no erythema and lips, buccal mucosa, and tongue normal  NECK: supple, thyroid normal size, non-tender, without nodularity LYMPH:  no palpable lymphadenopathy in the cervical, axillary or inguinal LUNGS: clear to auscultation and percussion with increased breathing effort HEART: regular rate & rhythm and no murmurs and no lower extremity edema ABDOMEN:abdomen soft, grossly distended with ascites, nontender with normal bowel sounds Musculoskeletal:no cyanosis of digits and no clubbing  PSYCH: alert & oriented x 3 with fluent speech NEURO: She appears debilitated with generalized weakness  LABORATORY DATA:  I have reviewed the data as listed Lab Results  Component Value Date   WBC 8.2 07/09/2017   HGB 13.0 07/09/2017   HCT 39.1 07/09/2017   MCV 86.7 07/09/2017   PLT 294 07/09/2017    Recent Labs  07/01/17 0430 07/07/17 1057 07/08/17 0441 07/09/17 0436  NA 133* 135 134* 136  K 3.9 3.7 3.9 3.4*  CL 100* 101 103 103  CO2 25 25 25 27   GLUCOSE 93 106* 108* 101*  BUN 9 7 8 9   CREATININE 0.74 0.82 0.68 0.81  CALCIUM 7.6* 7.9* 7.9* 7.5*  GFRNONAA >60 >60 >60 >60  GFRAA >60 >60 >60 >60  PROT 5.9* 6.2* 5.3*  --   ALBUMIN 2.4* 2.8* 2.4*  --   AST 20 22 17   --   ALT 11* 15 11*  --   ALKPHOS 34* 46 37*  --   BILITOT 0.9 0.8 0.5  --     RADIOGRAPHIC STUDIES: I have personally reviewed the radiological images as listed and agreed with the findings in the report. Dg Chest 2 View  Result Date: 06/30/2017 CLINICAL DATA:  83 year old female  with shortness of breath, vomiting and abdominal distention for 1 week. Peritoneal carcinomatosis diagnosed recently on CT. EXAM: CHEST  2 VIEW COMPARISON:  CT Abdomen and Pelvis 06/24/2017 and earlier. FINDINGS: Upright AP and lateral views of the chest. Small bilateral pleural effusions. No pneumothorax or pulmonary edema. No pulmonary nodule or consolidation. Normal cardiac size and mediastinal contours. Calcified aortic atherosclerosis. Osteopenia. No acute osseous abnormality identified. No pneumoperitoneum identified. Visible bowel gas pattern is stable from the recent CT. IMPRESSION: 1. Small pleural effusions probably not significantly changed from the recent CT 06/24/2017. No other acute cardiopulmonary abnormality. 2. Stable visible bowel gas pattern.  No abdominal free air. Electronically Signed   By: Genevie Ann M.D.   On: 06/30/2017 10:20   US Pelvis Transvanginal Non-ob (tv Only)  Result Date: 06/25/2017 CLINICAL DATA:  Ovarian mass on CT EXAM: TRANSABDOMINAL AND TRANSVAGINAL ULTRASOUND OF PELVIS TECHNIQUE: Both transabdominal and transvaginal ultrasound examinations of the pelvis were performed. Transabdominal technique was performed for global imaging of the pelvis including uterus, ovaries, adnexal regions, and pelvic cul-de-sac. It was necessary to proceed with endovaginal exam following the transabdominal exam to visualize the uterus, endometrium, ovaries and adnexa . COMPARISON:  CT 06/24/2017 FINDINGS: Uterus Measurements: 4.7 x 2.7 x 2.7 cm. No fibroids or other mass visualized. Endometrium Thickness: 2 mm in thickness.  No focal abnormality. Right ovary Measurements: 3.5 x 3.8 x 3.3 cm. 2.9 cm simple appearing cyst in the right ovary.  Left ovary Measurements: 2.3 x 3.0 x 2.6 cm, difficult to visualize. No visible focal mass. Other findings Large amount of ascites noted in the pelvis. IMPRESSION: 2.9 cm simple appearing cyst in the right ovary. Left ovary grossly unremarkable. Large volume  ascites in the pelvis. Electronically Signed   By: Rolm Baptise M.D.   On: 06/25/2017 12:09   US Pelvis (transabdominal Only)  Result Date: 06/25/2017 CLINICAL DATA:  Ovarian mass on CT EXAM: TRANSABDOMINAL AND TRANSVAGINAL ULTRASOUND OF PELVIS TECHNIQUE: Both transabdominal and transvaginal ultrasound examinations of the pelvis were performed. Transabdominal technique was performed for global imaging of the pelvis including uterus, ovaries, adnexal regions, and pelvic cul-de-sac. It was necessary to proceed with endovaginal exam following the transabdominal exam to visualize the uterus, endometrium, ovaries and adnexa . COMPARISON:  CT 06/24/2017 FINDINGS: Uterus Measurements: 4.7 x 2.7 x 2.7 cm. No fibroids or other mass visualized. Endometrium Thickness: 2 mm in thickness.  No focal abnormality. Right ovary Measurements: 3.5 x 3.8 x 3.3 cm. 2.9 cm simple appearing cyst in the right ovary. Left ovary Measurements: 2.3 x 3.0 x 2.6 cm, difficult to visualize. No visible focal mass. Other findings Large amount of ascites noted in the pelvis. IMPRESSION: 2.9 cm simple appearing cyst in the right ovary. Left ovary grossly unremarkable. Large volume ascites in the pelvis. Electronically Signed   By: Rolm Baptise M.D.   On: 06/25/2017 12:09   Ct Abdomen Pelvis W Contrast  Result Date: 06/24/2017 CLINICAL DATA:  Abdominal pain with distention and nausea EXAM: CT ABDOMEN AND PELVIS WITH CONTRAST TECHNIQUE: Multidetector CT imaging of the abdomen and pelvis was performed using the standard protocol following bolus administration of intravenous contrast. CONTRAST:  133m ISOVUE-300 IOPAMIDOL (ISOVUE-300) INJECTION 61% COMPARISON:  02/07/2017 FINDINGS: Lower chest: Small right-sided pleural effusion. No acute consolidation at the lung bases. Normal heart size. Hepatobiliary: No focal hepatic abnormality. Surgical absence of the gallbladder. No biliary dilatation. Pancreas: Unremarkable. No pancreatic ductal dilatation  or surrounding inflammatory changes. Spleen: Stable 1.8 cm low-density lesion in the posterior spleen since 2017. Tiny hypodensity anterior spleen, also unchanged. Adrenals/Urinary Tract: Adrenal glands are within normal limits. Kidneys show no hydronephrosis. The bladder is nearly empty. Stomach/Bowel: Stomach is nonenlarged. No dilated small bowel. No colon wall thickening. Appendix is not well seen but no right lower quadrant inflammation. Vascular/Lymphatic: Aortic atherosclerosis. No aneurysmal dilatation. Enlarging upper abdominal/retrocaval lymph node, now measuring 14 mm. Multiple small periaortic lymph nodes, slight increase in size compared to prior. Reproductive: Uterus is atrophic. Re- demonstrated 3.8 cm right adnexal cyst. Increasing soft tissue density in the left at adnexa, now measuring 2.9 cm, with possible posterior cystic component measuring 19 mm. Other: Negative for free air. Development of moderate abdominal and pelvic ascites. Diffuse nodular infiltration of the omentum and anterior mesentery. Musculoskeletal: Degenerative changes of the spine, most notable at L4-L5 and L5-S1. Status post left femoral intramedullary rod. IMPRESSION: 1. Interim finding of moderate ascites within the abdomen and pelvis with additional finding of diffuse nodular infiltration of the omentum and anterior mesenteric fat, the appearance would be consistent with peritoneal carcinomatosis/metastatic disease. Increasing retroperitoneal and upper abdominal adenopathy. 2. Re- demonstrated 3.8 cm cyst in the right adnexa. Enlarging soft tissue density in the left adnexa now with possible cystic component posteriorly. In light of the above findings, concern is for ovarian neoplasm. Correlation with pelvic ultrasound recommended. 3. Small right-sided pleural effusion, new since prior study 4. Stable hypodense splenic lesions since 2017. Electronically Signed  By: Donavan Foil M.D.   On: 06/24/2017 22:00   US  Paracentesis  Result Date: 07/08/2017 INDICATION: Patient with history of omental nodularity, abdominal pain, recurrent malignant ascites; request made for therapeutic paracentesis. EXAM: ULTRASOUND GUIDED THERAPEUTIC PARACENTESIS MEDICATIONS: None. COMPLICATIONS: None immediate. PROCEDURE: Informed written consent was obtained from the patient after a discussion of the risks, benefits and alternatives to treatment. A timeout was performed prior to the initiation of the procedure. Initial ultrasound scanning demonstrates a small to moderate amount of ascites within the right lower abdominal quadrant. The right lower abdomen was prepped and draped in the usual sterile fashion. 2% lidocaine was used for local anesthesia. Following this, a Yueh catheter was introduced. An ultrasound image was saved for documentation purposes. The paracentesis was performed. The catheter was removed and a dressing was applied. The patient tolerated the procedure well without immediate post procedural complication. FINDINGS: A total of approximately 2.7 liters of turbid, light brown fluid was removed. IMPRESSION: Successful ultrasound-guided therapeutic paracentesis yielding 2.7 liters liters of peritoneal fluid. Read by: Rowe Robert, PA-C Electronically Signed   By: Jerilynn Mages.  Shick M.D.   On: 07/08/2017 14:00   US Paracentesis  Result Date: 07/01/2017 INDICATION: Omental nodularity, abdominal pain, ascites, retroperitoneal adenopathy. Request made for diagnostic and therapeutic paracentesis. EXAM: ULTRASOUND GUIDED DIAGNOSTIC AND THERAPEUTIC PARACENTESIS MEDICATIONS: None. COMPLICATIONS: None immediate. PROCEDURE: Informed written consent was obtained from the patient after a discussion of the risks, benefits and alternatives to treatment. A timeout was performed prior to the initiation of the procedure. Initial ultrasound scanning demonstrates a small to moderate amount of ascites within the left lower abdominal quadrant. The left  lower abdomen was prepped and draped in the usual sterile fashion. 2% lidocaine was used for local anesthesia. Following this, a Yueh catheter was introduced. An ultrasound image was saved for documentation purposes. The paracentesis was performed. The catheter was removed and a dressing was applied. The patient tolerated the procedure well without immediate post procedural complication. FINDINGS: A total of approximately 2.5 liters of turbid, light brown fluid was removed. Samples were sent to the laboratory as requested by the clinical team. IMPRESSION: Successful ultrasound-guided diagnostic and therapeutic paracentesis yielding 2.5 liters of peritoneal fluid. Read by: Rowe Robert, PA-C Electronically Signed   By: Markus Daft M.D.   On: 07/01/2017 11:49   Dg Chest Port 1 View  Result Date: 07/07/2017 CLINICAL DATA:  Shortness of breath. EXAM: PORTABLE CHEST 1 VIEW COMPARISON:  06/30/2017 FINDINGS: The cardiomediastinal silhouette is unchanged and within normal limits. Aortic atherosclerosis is noted. There are small bilateral pleural effusions which appear larger than on the prior study, although this could be due to differences in patient positioning. Patchy opacity is present in the lung bases. No pneumothorax is identified. No acute osseous abnormality is seen. IMPRESSION: Small pleural effusions, likely increased in size with patchy bibasilar atelectasis or pneumonia. Electronically Signed   By: Logan Bores M.D.   On: 07/07/2017 11:24   Ir Paracentesis  Result Date: 07/12/2017 INDICATION: Recurrent ascites suspicious for carcinomatosis. Request therapeutic paracentesis EXAM: ULTRASOUND GUIDED RIGHT LOWER QUADRANT PARACENTESIS MEDICATIONS: None. COMPLICATIONS: None immediate. PROCEDURE: Informed written consent was obtained from the patient after a discussion of the risks, benefits and alternatives to treatment. A timeout was performed prior to the initiation of the procedure. Initial ultrasound  scanning demonstrates a large amount of ascites within the right lower abdominal quadrant. The right lower abdomen was prepped and draped in the usual sterile fashion. 1% lidocaine with  epinephrine was used for local anesthesia. Following this, a 19 gauge, 7-cm, Yueh catheter was introduced. An ultrasound image was saved for documentation purposes. The paracentesis was performed. The catheter was removed and a dressing was applied. The patient tolerated the procedure well without immediate post procedural complication. FINDINGS: A total of approximately 1450 mL of cloudy yellow fluid was removed. IMPRESSION: Successful ultrasound-guided paracentesis yielding 1450 mL of peritoneal fluid. Read by: Ascencion Dike PA-C Electronically Signed   By: Jerilynn Mages.  Shick M.D.   On: 07/12/2017 10:41    ASSESSMENT & PLAN:   Locally advanced ovarian cancer The patient has locally advanced ovarian cancer She is recommended neoadjuvant chemotherapy due to her general debility We discussed the role of neoadjuvant chemotherapy approach I am very concerned given her debility and weakness that she will not be able to tolerate outpatient chemotherapy I recommend admission to the hospital for aggressive medical management and first dose inpatient chemotherapy We reviewed the NCCN guidelines We discussed the role of chemotherapy. The intent is of curative intent.  We discussed some of the risks, benefits, side-effects of carboplatin & Taxol. Treatment is intravenous, every 3 weeks x 6 cycles  Some of the short term side-effects included, though not limited to, including weight loss, life threatening infections, risk of allergic reactions, need for transfusions of blood products, nausea, vomiting, change in bowel habits, loss of hair, admission to hospital for various reasons, and risks of death.   Long term side-effects are also discussed including risks of infertility, permanent damage to nerve function, hearing loss, chronic  fatigue, kidney damage with possibility needing hemodialysis, and rare secondary malignancy including bone marrow disorders.  The patient is aware that the response rates discussed earlier is not guaranteed.  After a long discussion, patient made an informed decision to proceed with the prescribed plan of care.  We discussed premedication with dexamethasone before chemotherapy. I will get chemo education class while she is hospitalized I will recommend port placement in the future but we will proceed with treatment using peripheral venous access tomorrow I will draw baseline tumor marker today She would benefit from prophylactic G-CSF support after chemotherapy  Acute on chronic COPD exacerbation She has mild respiratory failure due to progressive abdominal distention causing difficulty taking in deep breaths She will continue oxygen therapy and inhalers I do not believe the patient have a new infection I will repeat chest x-ray while she is hospitalized for further assessment  Malignant ascites She will benefit from therapeutic paracentesis tomorrow  Chronic atrial fibrillation I will hold Xarelto in anticipation for invasive procedures I will switch her to 81 mg aspirin for now  Chronic nausea This is secondary to untreated cancer She will continue antiemetics  Insomnia According to her, it is due to difficulties with breathing secondary to ascites I would give her anxiolytics while admitted  DVT prophylaxis I will put her on Lovenox  Generalized weakness and debility This is due to untreated cancer I will consult physical therapy for assessment  Poor social circumstances She lives alone The patient is at high risk of fall, infection and risk of readmission to the hospital She has very poor social circumstances I recommend social worker consult to look for skilled nursing facility I think she would benefit from active rehabilitation and skilled nursing facility over the  next 3 months until she completes neoadjuvant chemotherapy and surgery if possible  CODE STATUS We discussed goals of care We discussed CODE STATUS The patient desire full code I recommend  consulting social worker for advanced directive documentation She appointed her daughter, [name] as her healthcare power of attorney  Discharge planning She will likely be hospitalized over the weekend Hopefully, we can find her a skilled nursing facility next week  All questions were answered. The patient knows to call the clinic with any problems, questions or concerns. I spent 60 minutes counseling the patient face to face. The total time spent in the appointment was 80 minutes and more than 50% was on counseling.     Heath Lark, MD 07/18/2017 2:24 PM

## 2017-07-18 NOTE — Progress Notes (Signed)
START ON PATHWAY REGIMEN - Ovarian     A cycle is every 21 days:     Paclitaxel      Carboplatin   **Always confirm dose/schedule in your pharmacy ordering system**    Patient Characteristics: Newly Diagnosed, Neoadjuvant Therapy AJCC T Category: T3 AJCC N Category: N1 AJCC M Category: M1 Therapeutic Status: Newly Diagnosed, Neoadjuvant Therapy AJCC 8 Stage Grouping: IV Intent of Therapy: Curative Intent, Discussed with Patient

## 2017-07-18 NOTE — Progress Notes (Signed)
IR aware of request for PAC.  We will plan on Monday given scheduling.   [name] E 3:54 PM 07/18/2017

## 2017-07-19 ENCOUNTER — Inpatient Hospital Stay (HOSPITAL_COMMUNITY): Payer: Medicare Other

## 2017-07-19 ENCOUNTER — Ambulatory Visit (HOSPITAL_COMMUNITY): Admission: RE | Admit: 2017-07-19 | Payer: Medicare Other | Source: Ambulatory Visit

## 2017-07-19 DIAGNOSIS — Z5111 Encounter for antineoplastic chemotherapy: Secondary | ICD-10-CM

## 2017-07-19 DIAGNOSIS — K5909 Other constipation: Secondary | ICD-10-CM

## 2017-07-19 LAB — CA 125: Cancer Antigen (CA) 125: 1941 U/mL — ABNORMAL HIGH (ref 0.0–38.1)

## 2017-07-19 MED ORDER — SODIUM CHLORIDE 0.9% FLUSH
3.0000 mL | INTRAVENOUS | Status: DC | PRN
  Administered 2017-07-23: 3 mL via INTRAVENOUS
  Filled 2017-07-19: qty 3

## 2017-07-19 MED ORDER — HEPARIN SOD (PORK) LOCK FLUSH 100 UNIT/ML IV SOLN
250.0000 [IU] | Freq: Once | INTRAVENOUS | Status: DC | PRN
  Filled 2017-07-19: qty 5

## 2017-07-19 MED ORDER — PALONOSETRON HCL INJECTION 0.25 MG/5ML
0.2500 mg | Freq: Once | INTRAVENOUS | Status: AC
  Administered 2017-07-19: 0.25 mg via INTRAVENOUS
  Filled 2017-07-19: qty 5

## 2017-07-19 MED ORDER — METHYLPREDNISOLONE SODIUM SUCC 125 MG IJ SOLR: 125.0000 mg | Freq: Once | INTRAMUSCULAR | Status: DC | PRN

## 2017-07-19 MED ORDER — DIPHENHYDRAMINE HCL 50 MG/ML IJ SOLN: 50.0000 mg | Freq: Once | INTRAMUSCULAR | Status: DC | PRN

## 2017-07-19 MED ORDER — HEPARIN SOD (PORK) LOCK FLUSH 100 UNIT/ML IV SOLN
500.0000 [IU] | Freq: Once | INTRAVENOUS | Status: AC | PRN
  Administered 2017-07-24: 500 [IU]

## 2017-07-19 MED ORDER — EPINEPHRINE PF 1 MG/10ML IJ SOSY: 0.2500 mg | PREFILLED_SYRINGE | Freq: Once | INTRAMUSCULAR | Status: DC | PRN

## 2017-07-19 MED ORDER — ALBUTEROL SULFATE (2.5 MG/3ML) 0.083% IN NEBU: 2.5000 mg | INHALATION_SOLUTION | Freq: Once | RESPIRATORY_TRACT | Status: DC | PRN

## 2017-07-19 MED ORDER — ALTEPLASE 2 MG IJ SOLR
2.0000 mg | Freq: Once | INTRAMUSCULAR | Status: DC | PRN
  Filled 2017-07-19: qty 2

## 2017-07-19 MED ORDER — EPINEPHRINE PF 1 MG/ML IJ SOLN
0.5000 mg | Freq: Once | INTRAMUSCULAR | Status: DC | PRN
  Filled 2017-07-19: qty 1

## 2017-07-19 MED ORDER — SODIUM CHLORIDE 0.9 % IV SOLN: Freq: Once | INTRAVENOUS | Status: DC | PRN

## 2017-07-19 MED ORDER — CARBOPLATIN CHEMO INJECTION 600 MG/60ML
487.2000 mg | Freq: Once | INTRAVENOUS | Status: AC
  Administered 2017-07-19: 490 mg via INTRAVENOUS
  Filled 2017-07-19: qty 45

## 2017-07-19 MED ORDER — PACLITAXEL CHEMO INJECTION 300 MG/50ML
175.0000 mg/m2 | Freq: Once | INTRAVENOUS | Status: AC
  Administered 2017-07-19: 336 mg via INTRAVENOUS
  Filled 2017-07-19: qty 50

## 2017-07-19 MED ORDER — LIDOCAINE HCL 2 % IJ SOLN
INTRAMUSCULAR | Status: AC
  Filled 2017-07-19: qty 10

## 2017-07-19 MED ORDER — DIPHENHYDRAMINE HCL 50 MG/ML IJ SOLN
50.0000 mg | Freq: Once | INTRAMUSCULAR | Status: AC
  Administered 2017-07-19: 50 mg via INTRAVENOUS
  Filled 2017-07-19: qty 1

## 2017-07-19 MED ORDER — COLD PACK MISC ONCOLOGY
1.0000 | Freq: Once | Status: AC | PRN
  Filled 2017-07-19: qty 1

## 2017-07-19 MED ORDER — FAMOTIDINE IN NACL 20-0.9 MG/50ML-% IV SOLN
20.0000 mg | Freq: Once | INTRAVENOUS | Status: AC
  Administered 2017-07-19: 20 mg via INTRAVENOUS
  Filled 2017-07-19: qty 50

## 2017-07-19 MED ORDER — FAMOTIDINE IN NACL 20-0.9 MG/50ML-% IV SOLN
20.0000 mg | Freq: Once | INTRAVENOUS | Status: DC | PRN
  Filled 2017-07-19: qty 50

## 2017-07-19 MED ORDER — SODIUM CHLORIDE 0.9 % IV SOLN
Freq: Once | INTRAVENOUS | Status: AC
  Administered 2017-07-19: 11:00:00 via INTRAVENOUS

## 2017-07-19 MED ORDER — DIPHENHYDRAMINE HCL 50 MG/ML IJ SOLN: 25.0000 mg | Freq: Once | INTRAMUSCULAR | Status: DC | PRN

## 2017-07-19 MED ORDER — SODIUM CHLORIDE 0.9% FLUSH
10.0000 mL | INTRAVENOUS | Status: DC | PRN
  Administered 2017-07-23: 10 mL
  Filled 2017-07-19: qty 10

## 2017-07-19 MED ORDER — FOSAPREPITANT DIMEGLUMINE INJECTION 150 MG
Freq: Once | INTRAVENOUS | Status: AC
  Administered 2017-07-19: 13:00:00 via INTRAVENOUS
  Filled 2017-07-19: qty 5

## 2017-07-19 NOTE — Progress Notes (Signed)
Pt tolerated carboplatin infusion well. IV site still intact with no signs of irritation.

## 2017-07-19 NOTE — Progress Notes (Signed)
Pt tolerated taxol infusion well, VSS. IV site clean, dry, and intact with no signs of irritation.

## 2017-07-19 NOTE — Progress Notes (Signed)
[name]   DOB:1933-06-19   EX#:[id number]    Subjective: She feels well.  She continues to have intermittent abdominal pain, stable No recent nausea & vomiting She sleeps poorly last night likely due to corticosteroid side effects She denies cough  Objective:  Vitals:   07/19/17 0444 07/19/17 0812  BP: 118/82   Pulse: 98   Resp: 18   Temp: 98.2 F (36.8 C)   SpO2: 99% 97%     Intake/Output Summary (Last 24 hours) at 07/19/17 0904 Last data filed at 07/19/17 0445  Gross per 24 hour  Intake              360 ml  Output                0 ml  Net              360 ml    GENERAL:alert, no distress and comfortable SKIN: skin color, texture, turgor are normal, no rashes or significant lesions EYES: normal, Conjunctiva are pink and non-injected, sclera clear OROPHARYNX:no exudate, no erythema and lips, buccal mucosa, and tongue normal  NECK: supple, thyroid normal size, non-tender, without nodularity LYMPH:  no palpable lymphadenopathy in the cervical, axillary or inguinal LUNGS: clear to auscultation and percussion with normal breathing effort HEART: regular rate & rhythm and no murmurs and no lower extremity edema ABDOMEN:abdomen soft, non-tender and normal bowel sounds Musculoskeletal:no cyanosis of digits and no clubbing  NEURO: alert & oriented x 3 with fluent speech, no focal motor/sensory deficits   Labs:  Lab Results  Component Value Date   WBC 12.7 (H) 07/18/2017   HGB 14.3 07/18/2017   HCT 43.5 07/18/2017   MCV 88.4 07/18/2017   PLT 272 07/18/2017   NEUTROABS 9.7 (H) 07/18/2017    Lab Results  Component Value Date   NA 136 07/18/2017   K 3.6 07/18/2017   CL 101 07/18/2017   CO2 27 07/18/2017    Assessment & Plan:   Locally advanced ovarian cancer The patient has locally advanced ovarian cancer She is recommended neoadjuvant chemotherapy due to her general debility We discussed the role of neoadjuvant chemotherapy approach I am very concerned given  her debility and weakness that she will not be able to tolerate outpatient chemotherapy I recommend admission to the hospital for aggressive medical management and first dose inpatient chemotherapy I have given her premedication Plan would be for first dose carboplatin and Taxol today, to be given in the outpatient setting after discharge She would benefit from prophylactic G-CSF support after chemotherapy  Acute on chronic COPD exacerbation She has mild respiratory failure due to progressive abdominal distention causing difficulty taking in deep breaths She will continue oxygen therapy and inhalers I do not believe the patient have a new infection I will repeat chest x-ray after paracentesis for further assessment  Malignant ascites She will benefit from therapeutic paracentesis today  Chronic atrial fibrillation I will hold Xarelto in anticipation for invasive procedures I will switch her to 81 mg aspirin for now Plan to resume Xarelto next week after port placement  Chronic nausea This is secondary to untreated cancer She will continue antiemetics  Insomnia According to her, it is due to difficulties with breathing secondary to ascites I would give her anxiolytics while admitted  DVT prophylaxis I will put her on Lovenox  Generalized weakness and debility This is due to untreated cancer I will consult physical therapy for assessment  Poor social circumstances She lives alone  The patient is at high risk of fall, infection and risk of readmission to the hospital She has very poor social circumstances I recommend social worker consult to look for skilled nursing facility I think she would benefit from active rehabilitation and skilled nursing facility over the next 3 months until she completes neoadjuvant chemotherapy and surgery if possible  CODE STATUS We discussed goals of care We discussed CODE STATUS The patient desire full code I recommend consulting  social worker for advanced directive documentation She appointed her daughter, [name] as her healthcare power of attorney  Discharge planning She will likely be hospitalized over the weekend Hopefully, we can find her a skilled nursing facility next week  Heath Lark, MD 07/19/2017  9:04 AM

## 2017-07-19 NOTE — Progress Notes (Signed)
Referring Physician(s): Gorsuch,N  Supervising Physician: Sandi Mariscal  Patient Status:  [name] - In-pt  Chief Complaint:  Ovarian cancer  Subjective: Patient familiar to IR service from prior paracenteses, last on 10/5 yielding 1.5 L. She has a history of ovarian cancer with recurrent malignant ascites, COPD, paroxysmal atrial fibrillation, irritable bowel syndrome and psoriatic arthritis. She has had weight loss, intermittent abdominal pain, some dyspnea with exertion with chronic cough, intermittent nausea and some fatigue /weakness.Request now received for additional therapeutic paracentesis as well as Port-A-Cath placement for chemotherapy. Past Medical History:  Diagnosis Date  . COPD (chronic obstructive pulmonary disease) ([place])   . Depression   . Diverticulosis   . Family hx of colon cancer   . Fibromyalgia   . GERD (gastroesophageal reflux disease)   . Hemorrhoids   . Hiatal hernia   . History of shingles   . Hypothyroidism   . IBS (irritable bowel syndrome)   . Lymphocytic colitis   . Osteoporosis   . Paroxysmal atrial fibrillation ([facility])   . Psoriatic arthritis ([place])   . Rectal polyp   . Schatzki's ring   . Uterine polyp    Past Surgical History:  Procedure Laterality Date  . CHOLECYSTECTOMY  Dec 04, 2010  . COLONOSCOPY    . FEMUR IM NAIL Left 12/11/2013   Procedure: INTRAMEDULLARY (IM) NAIL FEMORAL;  Surgeon: [name], MD;  Location: [facility];  Service: Orthopedics;  Laterality: Left;  . FRACTURE SURGERY Left 12/11/2013   hip  . IR PARACENTESIS  07/12/2017  . KNEE SURGERY Left    x 2  . TOTAL KNEE REVISION  08/06/2012   Procedure: TOTAL KNEE REVISION;  Surgeon: [name], MD;  Location: [facility];  Service: Orthopedics;  Laterality: Left;  Left Total Knee Arthroplasty Revision  . Uterine polypectomy       Allergies: Codeine  Medications: Prior to Admission medications   Medication Sig Start Date End Date Taking? Authorizing Provider    albuterol (PROAIR HFA) 108 (90 Base) MCG/ACT inhaler Inhale 2 puffs into the lungs every 6 (six) hours as needed for wheezing or shortness of breath. 06/19/17  Yes Collene Gobble, MD  ALPRAZolam Duanne Moron) 0.25 MG tablet Take 0.25 mg by mouth at bedtime as needed for anxiety.   Yes [provider]  azelastine (ASTELIN) 0.1 % nasal spray Place 2 sprays into both nostrils 2 (two) times daily. Use in each nostril as directed   Yes [provider]  budesonide-formoterol (SYMBICORT) 160-4.5 MCG/ACT inhaler Inhale 2 puffs into the lungs 2 (two) times daily. 07/02/17  Yes Collene Gobble, MD  Cholecalciferol (VITAMIN D) 2000 units CAPS Take 2,000 Units by mouth daily.   Yes [provider]  diltiazem (CARTIA XT) 120 MG 24 hr capsule TAKE 1 CAPSULE BY MOUTH EVERY DAY 05/03/17  Yes Satira Sark, MD  diphenoxylate-atropine (LOMOTIL) 2.5-0.025 MG tablet Take 1 tablet by mouth 4 (four) times daily. Patient taking differently: Take 1 tablet by mouth 4 (four) times daily as needed for diarrhea or loose stools.  08/21/16  Yes [name], MD  fluticasone [facility]) 50 MCG/ACT nasal spray Place 2 sprays into both nostrils 2 (two) times daily. For nasal congestion 11/20/13  Yes [provider]  furosemide (LASIX) 20 MG tablet Take 1 tablet (20 mg total) by mouth every other day. 07/01/17 07/01/18 Yes Buriev, Arie Sabina, MD  levothyroxine (SYNTHROID, LEVOTHROID) 137 MCG tablet Take 137 mcg by mouth every morning. For thyroid  therapy   Yes [provider]  MAGNESIUM GLYCINATE PLUS PO Take 400 mg by mouth daily.   Yes [provider]  methylcellulose (ARTIFICIAL TEARS) 1 % ophthalmic solution Place 1 drop into both eyes 2 (two) times daily as needed. Dry eyes   Yes [provider]  prochlorperazine (COMPAZINE) 10 MG tablet Take 10 mg by mouth every 6 (six) hours as needed for nausea. 07/05/17  Yes [provider]  XARELTO 20 MG TABS tablet TAKE 1  TABLET BY MOUTH EVERY DAY WITH SUPPER 04/23/17  Yes Satira Sark, MD  guaiFENesin-dextromethorphan (ROBITUSSIN DM) 100-10 MG/5ML syrup Take 5 mLs by mouth every 4 (four) hours as needed for cough. 07/10/17   Hosie Poisson, MD     Vital Signs: BP 118/82 (BP Location: Right Arm)   Pulse 98   Temp 98.2 F (36.8 C) (Oral)   Resp 18   Ht 5' 4"  (1.626 m)   Wt 178 lb (80.7 kg)   SpO2 97%   BMI 30.55 kg/m   Physical Exam awake, alert. Chest with slightly diminished breath sounds right base, left clear. Heart with regular rate and rhythm. Abdomen slightly distended, soft, positive bowel sounds, mild generalized tenderness. No lower extremity edema.  Imaging: No results found.  Labs:  CBC:  Recent Labs  07/07/17 1057 07/08/17 0441 07/09/17 0436 07/18/17 1421  WBC 11.5* 10.6* 8.2 12.7*  HGB 14.8 13.1 13.0 14.3  HCT 43.8 39.8 39.1 43.5  PLT 377 335 294 272    COAGS: No results for input(s): INR, APTT in the last 8760 hours.  BMP:  Recent Labs  07/07/17 1057 07/08/17 0441 07/09/17 0436 07/18/17 1421  NA 135 134* 136 136  K 3.7 3.9 3.4* 3.6  CL 101 103 103 101  CO2 25 25 27 27   GLUCOSE 106* 108* 101* 148*  BUN 7 8 9 12   CALCIUM 7.9* 7.9* 7.5* 8.0*  CREATININE 0.82 0.68 0.81 0.85  GFRNONAA >60 >60 >60 >60  GFRAA >60 >60 >60 >60    LIVER FUNCTION TESTS:  Recent Labs  07/01/17 0430 07/07/17 1057 07/08/17 0441 07/18/17 1421  BILITOT 0.9 0.8 0.5 0.4  AST 20 22 17 24   ALT 11* 15 11* 25  ALKPHOS 34* 46 37* 64  PROT 5.9* 6.2* 5.3* 5.8*  ALBUMIN 2.4* 2.8* 2.4* 2.5*    Assessment and Plan: Patient with history of ovarian cancer with recurrent malignant ascites; scheduled for paracentesis today and Port-A-Cath placement on 10/15 for chemotherapy.Risks and benefits discussed with the patient including, but not limited to bleeding, infection, pneumothorax, or fibrin sheath development and need for additional procedures.All of the patient's questions were  answered, patient is agreeable to proceed.Consent signed and in chart.     Electronically Signed: D. Rowe Robert, PA-C 07/19/2017, 9:31 AM   I spent a total of 25 minutes at the the patient's bedside AND on the patient's [name] floor or unit, greater than 50% of which was counseling/coordinating care for Port-A-Cath placement    Patient ID: [name], female   DOB: 02/19/1933, 83 y.o.   MRN: [id number]

## 2017-07-19 NOTE — NC FL2 (Signed)
[place] LEVEL OF CARE SCREENING TOOL     IDENTIFICATION  Patient Name: [name] Birthdate: Nov 06, 1932 Sex: female Admission Date (Current Location): 07/18/2017  [facility] and [place] Number:  Herbalist and Address:  [facility],  [place] [address], [place]      Provider Number: [id number]  Attending Physician Name and Address:  Heath Lark, MD  Relative Name and Phone Number:       Current Level of Care: Hospital Recommended Level of Care: [place] Prior Approval Number:    Date Approved/Denied:   PASRR Number: [id number]  Discharge Plan: SNF    Current Diagnoses: Patient Active Problem List   Diagnosis Date Noted  . Ovarian CA, right ([place]) 07/18/2017  . PNA (pneumonia) 07/07/2017  . Ascites 06/30/2017  . Ascites, malignant 06/30/2017  . Right ovarian cyst 01/18/2017  . Elevated CA-125 01/18/2017  . Depression 12/23/2015  . Peripheral edema 08/23/2015  . Fracture of hip, left, closed ([place]) 12/08/2013  . Hip fracture ([place]) 12/08/2013  . PAD (peripheral artery disease) ([place]) 11/25/2013  . Encounter for therapeutic drug monitoring 11/09/2013  . Atrial fibrillation ([place]) 05/07/2013  . Cough 03/20/2013  . Total knee replacement status 10/07/2012  . Knee pain 10/07/2012  . Knee stiffness 10/07/2012  . Tachycardia 09/17/2012  . Chest pain 09/17/2012  . Difficulty in walking(719.7) 09/16/2012  . Muscle weakness (generalized) 09/16/2012  . Postop Acute blood loss anemia 08/08/2012  . 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 Bloating 02/14/2012  . Upper abdominal pain 02/14/2012  . Allergic rhinitis, seasonal 02/15/2011  . COLITIS 02/20/2010  . Diarrhea 01/02/2010  . ABDOMINAL PAIN -GENERALIZED 01/02/2010  . PERSONAL HX COLONIC POLYPS 01/02/2010  . HYPOTHYROIDISM 12/28/2009  . COPD (chronic obstructive pulmonary disease) ([place]) 12/28/2009  . ARTHRITIS 12/28/2009    Orientation  RESPIRATION BLADDER Height & Weight     Self, Time, Situation, Place  O2 (2L) Continent Weight: 178 lb (80.7 kg) Height:  5\' 4"  (162.6 cm)  BEHAVIORAL SYMPTOMS/MOOD NEUROLOGICAL BOWEL NUTRITION STATUS      Continent Diet (regular diet)  AMBULATORY STATUS COMMUNICATION OF NEEDS Skin   Limited Assist Verbally Normal                       Personal Care Assistance Level of Assistance  Bathing, Feeding, Dressing Bathing Assistance: Limited assistance Feeding assistance: Independent Dressing Assistance: Limited assistance     Functional Limitations Info  Sight, Hearing, Speech Sight Info: Adequate Hearing Info: Adequate Speech Info: Adequate    SPECIAL CARE FACTORS FREQUENCY  PT (By licensed PT), OT (By licensed OT)     PT Frequency: 5x OT Frequency: 5x            Contractures Contractures Info: Not present    Additional Factors Info  Code Status, Allergies Code Status Info: full code Allergies Info: codeine           Current Medications (07/19/2017):  This is the current hospital active medication list Current Facility-Administered Medications  Medication Dose Route Frequency Provider Last Rate Last Dose  . 0.9 %  sodium chloride infusion   Intravenous Once PRN Alvy Bimler, Ni, MD      . acetaminophen (TYLENOL) tablet 650 mg  650 mg Oral Q4H PRN Gorsuch, Ni, MD      . albuterol (PROVENTIL) (2.5 MG/3ML) 0.083% nebulizer solution 2.5 mg  2.5 mg Nebulization Q6H PRN Heath Lark, MD      .  albuterol (PROVENTIL) (2.5 MG/3ML) 0.083% nebulizer solution 2.5 mg  2.5 mg Nebulization Once PRN Alvy Bimler, Ni, MD      . ALPRAZolam Duanne Moron) tablet 0.25 mg  0.25 mg Oral QHS PRN Alvy Bimler, Ni, MD   0.25 mg at 07/18/17 2137  . alteplase (CATHFLO ACTIVASE) injection 2 mg  2 mg Intracatheter Once PRN Heath Lark, MD      . aspirin EC tablet 81 mg  81 mg Oral Daily Gorsuch, Ni, MD   81 mg at 07/19/17 1030  . azelastine (ASTELIN) 0.1 % nasal spray 2 spray  2 spray Each Nare BID Heath Lark,  MD   2 spray at 07/18/17 2138  . CARBOplatin (PARAPLATIN) 490 mg in sodium chloride 0.9 % 250 mL chemo infusion  490 mg Intravenous Once Alvy Bimler, Ni, MD      . cholecalciferol (VITAMIN D) tablet 2,000 Units  2,000 Units Oral Daily Alvy Bimler, Ni, MD   2,000 Units at 07/19/17 1030  . Cold Pack 1 packet  1 packet Topical Once PRN Alvy Bimler, Ni, MD      . diltiazem (CARDIZEM CD) 24 hr capsule 120 mg  120 mg Oral Daily Alvy Bimler, Ni, MD   120 mg at 07/18/17 1749  . diphenhydrAMINE (BENADRYL) injection 25 mg  25 mg Intravenous Once PRN Alvy Bimler, Ni, MD      . diphenhydrAMINE (BENADRYL) injection 50 mg  50 mg Intravenous Once Gorsuch, Ni, MD      . diphenhydrAMINE (BENADRYL) injection 50 mg  50 mg Intravenous Once PRN Alvy Bimler, Ni, MD      . diphenoxylate-atropine (LOMOTIL) 2.5-0.025 MG per tablet 1 tablet  1 tablet Oral QID Heath Lark, MD   1 tablet at 07/19/17 1030  . enoxaparin (LOVENOX) injection 40 mg  40 mg Subcutaneous Q24H Alvy Bimler, Ni, MD   40 mg at 07/18/17 2137  . EPINEPHrine (ADRENALIN) 0.5 mg  0.5 mg Subcutaneous Once PRN Alvy Bimler, Ni, MD      . EPINEPHrine (ADRENALIN) 0.5 mg  0.5 mg Subcutaneous Once PRN Alvy Bimler, Ni, MD      . EPINEPHrine (ADRENALIN) 1 MG/10ML injection 0.25 mg  0.25 mg Intravenous Once PRN Alvy Bimler, Ni, MD      . EPINEPHrine (ADRENALIN) 1 MG/10ML injection 0.25 mg  0.25 mg Intravenous Once PRN Alvy Bimler, Ni, MD      . famotidine (PEPCID) IVPB 20 mg premix  20 mg Intravenous Once Alvy Bimler, Ni, MD 200 mL/hr at 07/19/17 1246 20 mg at 07/19/17 1246  . famotidine (PEPCID) IVPB 20 mg premix  20 mg Intravenous Once PRN Alvy Bimler, Ni, MD      . fluticasone (FLONASE) 50 MCG/ACT nasal spray 2 spray  2 spray Each Nare BID Heath Lark, MD   2 spray at 07/18/17 2138  . fosaprepitant (EMEND) 150 mg, dexamethasone (DECADRON) 12 mg in sodium chloride 0.9 % 145 mL IVPB   Intravenous Once Gorsuch, Ni, MD      . guaiFENesin-dextromethorphan (ROBITUSSIN DM) 100-10 MG/5ML syrup 5 mL  5 mL Oral Q4H PRN  Gorsuch, Ni, MD      . heparin lock flush 100 unit/mL  500 Units Intracatheter Once PRN Alvy Bimler, Ni, MD      . heparin lock flush 100 unit/mL  250 Units Intracatheter Once PRN Alvy Bimler, Ni, MD      . levothyroxine (SYNTHROID, LEVOTHROID) tablet 137 mcg  137 mcg Oral QAC breakfast Heath Lark, MD   137 mcg at 07/19/17 0845  . methylPREDNISolone sodium succinate (SOLU-MEDROL) 125 mg/2 mL injection 125 mg  125 mg Intravenous Once PRN Heath Lark, MD      . mometasone-formoterol (DULERA) 200-5 MCG/ACT inhaler 2 puff  2 puff Inhalation BID Heath Lark, MD   2 puff at 07/19/17 0812  . ondansetron (ZOFRAN) tablet 4-8 mg  4-8 mg Oral Q8H PRN Alvy Bimler, Ni, MD       Or  . ondansetron (ZOFRAN-ODT) disintegrating tablet 4-8 mg  4-8 mg Oral Q8H PRN Alvy Bimler, Ni, MD       Or  . ondansetron (ZOFRAN) injection 4 mg  4 mg Intravenous Q8H PRN Gorsuch, Ni, MD       Or  . ondansetron (ZOFRAN) 8 mg in sodium chloride 0.9 % 50 mL IVPB  8 mg Intravenous Q8H PRN Gorsuch, Ni, MD      . PACLitaxel (TAXOL) 336 mg in dextrose 5 % 500 mL chemo infusion (> 80mg /m2)  175 mg/m2 (Treatment Plan Recorded) Intravenous Once Alvy Bimler, Ni, MD      . palonosetron (ALOXI) injection 0.25 mg  0.25 mg Intravenous Once Alvy Bimler, Ni, MD      . polyvinyl alcohol (LIQUIFILM TEARS) 1.4 % ophthalmic solution 1 drop  1 drop Both Eyes PRN Alvy Bimler, Ni, MD      . prochlorperazine (COMPAZINE) tablet 10 mg  10 mg Oral Q6H PRN Alvy Bimler, Ni, MD      . senna-docusate (Senokot-S) tablet 1 tablet  1 tablet Oral QHS PRN Gorsuch, Ni, MD      . sodium chloride flush (NS) 0.9 % injection 10 mL  10 mL Intracatheter PRN Gorsuch, Ni, MD      . sodium chloride flush (NS) 0.9 % injection 3 mL  3 mL Intravenous PRN Gorsuch, Ni, MD      . zolpidem (AMBIEN) tablet 5 mg  5 mg Oral QHS PRN Heath Lark, MD         Discharge Medications: Please see discharge summary for a list of discharge medications.  Relevant Imaging Results:  Relevant Lab Results:   Additional  Information SS# [id number]. Pt has chemotherapy appointments at [facility], [place]

## 2017-07-19 NOTE — Evaluation (Signed)
Physical Therapy Evaluation Patient Details Name: [name] MRN: [id number] DOB: [birth year] Today's Date: 07/19/2017   History of Present Illness  83 yo female adm with ascites, resp distress; history of ovarian cancer with recurrent malignant ascites, COPD, paroxysmal atrial fibrillation, irritable bowel syndrome and psoriatic arthritis  Clinical Impression  Pt admitted with above diagnosis. Pt currently with functional limitations due to the deficits listed below (see PT Problem List).  Pt will benefit from skilled PT to increase their independence and safety with mobility to allow discharge to the venue listed below.  Pt fatigues quickly, with decr balance and is at risk for falls, 2 recent admissions; will benefit from SNF post acute; pt is very motivated     Follow Up Recommendations SNF    Equipment Recommendations  None recommended by PT    Recommendations for Other Services       Precautions / Restrictions Precautions Precautions: Fall Precaution Comments: O2 at home x1wk prior to this adm      Mobility  Bed Mobility Overal bed mobility: Needs Assistance Bed Mobility: Supine to Sit     Supine to sit: Supervision;HOB elevated     General bed mobility comments: incr time, heavy use of rail, requests HOB up d/t difficulty breathing when lying flat  Transfers Overall transfer level: Needs assistance Equipment used: Rolling walker (2 wheeled) Transfers: Sit to/from Stand Sit to Stand: Min guard         General transfer comment: cues for safety  Ambulation/Gait Ambulation/Gait assistance: Min guard Ambulation Distance (Feet): 12 Feet (in room) Assistive device: Rolling walker (2 wheeled) Gait Pattern/deviations: Step-through pattern;Decreased stride length     General Gait Details: cues for posture and RW position; fatigues quickly requiring sitting rest, VSS with 2L O2 throughout  Stairs            Wheelchair Mobility    Modified Rankin  (Stroke Patients Only)       Balance Overall balance assessment: Needs assistance   Sitting balance-Leahy Scale: Good     Standing balance support: Single extremity supported Standing balance-Leahy Scale: Poor Standing balance comment: unsteady without at least unilateral UE support                             Pertinent Vitals/Pain Pain Assessment: No/denies pain    Home Living Family/patient expects to be discharged to:: Skilled nursing facility Living Arrangements: Alone                    Prior Function Level of Independence: Independent with assistive device(s)         Comments: amb with RW at baseline in and outside of home     Hand Dominance        Extremity/Trunk Assessment   Upper Extremity Assessment Upper Extremity Assessment: Overall WFL for tasks assessed    Lower Extremity Assessment Lower Extremity Assessment: Generalized weakness (LEs fatigue rapidly )       Communication   Communication: No difficulties  Cognition Arousal/Alertness: Awake/alert Behavior During Therapy: WFL for tasks assessed/performed Overall Cognitive Status: Within Functional Limits for tasks assessed                                        General Comments      Exercises     Assessment/Plan    PT Assessment  Patient needs continued PT services  PT Problem List Decreased activity tolerance;Decreased balance;Decreased mobility;Decreased knowledge of use of DME       PT Treatment Interventions DME instruction;Gait training;Functional mobility training;Therapeutic activities;Therapeutic exercise    PT Goals (Current goals can be found in the Care Plan section)  Acute Rehab PT Goals Patient Stated Goal: get a lot stronger and have confidence to go out shopping (without fear of falling) PT Goal Formulation: With patient Time For Goal Achievement: 07/26/17 Potential to Achieve Goals: Good    Frequency Min 3X/week   Barriers to  discharge        Co-evaluation               AM-PAC PT "6 Clicks" Daily Activity  Outcome Measure Difficulty turning over in bed (including adjusting bedclothes, sheets and blankets)?: Unable Difficulty moving from lying on back to sitting on the side of the bed? : Unable Difficulty sitting down on and standing up from a chair with arms (e.g., wheelchair, bedside commode, etc,.)?: Unable Help needed moving to and from a bed to chair (including a wheelchair)?: A Little Help needed walking in hospital room?: A Little Help needed climbing 3-5 steps with a railing? : A Lot 6 Click Score: 11    End of Session Equipment Utilized During Treatment: Gait belt Activity Tolerance: Patient tolerated treatment well Patient left: in chair;with call bell/phone within reach   PT Visit Diagnosis: Unsteadiness on feet (R26.81);Difficulty in walking, not elsewhere classified (R26.2)    Time: 5625-6389 PT Time Calculation (min) (ACUTE ONLY): 24 min   Charges:   PT Evaluation $PT Eval Low Complexity: 1 Low PT Treatments $Gait Training: 8-22 mins   PT G Codes:          Marney Treloar August 02, 2017, 10:23 AM

## 2017-07-19 NOTE — Progress Notes (Signed)
Patient ID: [name], female   DOB: 17-Nov-1932, 83 y.o.   MRN: [id number] Patient presented to ultrasound dept  today for therapeutic paracentesis. On limited ultrasound of abdomen in all 4 quadrants there is only a small amount of ascites present. Paracentesis was not performed. Patient notified.

## 2017-07-19 NOTE — Progress Notes (Signed)
Chemotherapy dosages and calculations verified with Adline Peals, RN

## 2017-07-19 NOTE — Progress Notes (Signed)
Consent completed with MD, chemotherapy education completed with patient and daughter. All concerns addressed and questions answered.

## 2017-07-19 NOTE — Progress Notes (Signed)
Chemotherapy dosages and calculations verified with Adline Peals, RN.

## 2017-07-19 NOTE — Clinical Social Work Note (Signed)
Clinical Social Work Assessment  Patient Details  Name: [name] MRN: [id number] Date of Birth: April 14, 1933  Date of referral:  07/19/17               Reason for consult:  Facility Placement                Permission sought to share information with:  Family Supports Permission granted to share information::  Yes, Verbal Permission Granted  Name::     daughter [name]  Agency::     Relationship::     Contact Information:     Housing/Transportation Living arrangements for the past 2 months:  Single Family Home Source of Information:  Adult Children Patient Interpreter Needed:  None Criminal Activity/Legal Involvement Pertinent to Current Situation/Hospitalization:  No - Comment as needed Significant Relationships:  Adult Children, Other Family Members, Friend Lives with:  Self Do you feel safe going back to the place where you live?  Yes Need for family participation in patient care:  No (Coment)  Care giving concerns:  Pt from home where she resides alone with much support from daughter who lives nearby and [facility] PT/OT/CNA. At baseline she transfers independently, uses walker to ambulate short distances around the house, can prepare food but "gets very tired standing that long."  Pt beginning round 1 of planned 6 rounds of chemotherapy per daughter. Daughter feels at current level of care she cannot assist pt as much as she will need at DC from hospital.    Social Worker assessment / plan:  CSW consulted to assist with SNF placement. Met with pt's daughter while pt out of room receiving xray. Pt advised daughter can guide decisions. Discussed pt's care needs with daughter, including PT recommendation for SNF for ST rehab. Daughter and pt in agreement with this and state they have been anticipating pt would need this as she has been "getting weaker.''  Pt is receiving chemotherapy, first round in hospital as first of 6 rounds every 21 days per daughter. Plan to continue  this in [facility] after DC from hospital. For this reason pt/daughter request SNF placement in [place] to be near [facility]. Daughter states she will transport pt for appointments. Reports pt was in [place] for rehab following hip/knee surgeries about 3 years ago and they would be happy to admit there again if available. Open to other facilities as well. Agreed to have CSW make referrals and follow up with both pt and daughter with bed offers.   Discussed with daughter that facility choices may be limited as chemotherapy is sometimes seen as incongruent with being able to participate in rehabilitation. Daughter very understanding and will consider options as they arise.   Daughter notes pt has long term care insurance policy which could eventually be payor source for SNF if pt needs LTC going forward. At this point hoping for ST rehab still.  Completed FL2, made referrals to area SNFs, and will follow up with bed offers.   Employment status:  Retired Forensic scientist:  [employer] PT Recommendations:  [place] / Referral to community resources:  [place]  Patient/Family's Response to care:  Appreciative and engaged in care  Patient/Family's Understanding of and Emotional Response to Diagnosis, Current Treatment, and Prognosis:  Daughter demonstrates thorough understanding of prognosis and plan. Able to provide good history of her mother's health and care needs.  Pt not present after beginning of assessment but per daughter shows understanding of plan and wishes to remain  her own decision maker with help from daughter.   Emotional Assessment Appearance:  Appears stated age Attitude/Demeanor/Rapport:   (UTA) Affect (typically observed):   (UTA) Orientation:  Oriented to Self, Oriented to  Time, Oriented to Situation, Oriented to Place (per staff) Alcohol / Substance use:  Not Applicable Psych involvement (Current and /or in the community):  No  (Comment)  Discharge Needs  Concerns to be addressed:  Discharge Planning Concerns Readmission within the last 30 days:  No Current discharge risk:  Dependent with Mobility, Lives alone Barriers to Discharge:  Continued Medical Work up   [employer], LCSW 07/19/2017, 1:00 PM  [phone]

## 2017-07-20 DIAGNOSIS — J449 Chronic obstructive pulmonary disease, unspecified: Secondary | ICD-10-CM

## 2017-07-20 DIAGNOSIS — D61810 Antineoplastic chemotherapy induced pancytopenia: Secondary | ICD-10-CM

## 2017-07-20 DIAGNOSIS — I4891 Unspecified atrial fibrillation: Secondary | ICD-10-CM

## 2017-07-20 DIAGNOSIS — T451X5A Adverse effect of antineoplastic and immunosuppressive drugs, initial encounter: Secondary | ICD-10-CM

## 2017-07-20 MED ORDER — TBO-FILGRASTIM 480 MCG/0.8ML ~~LOC~~ SOSY
480.0000 ug | PREFILLED_SYRINGE | Freq: Every day | SUBCUTANEOUS | Status: DC
  Administered 2017-07-20 – 2017-07-21 (×2): 480 ug via SUBCUTANEOUS
  Filled 2017-07-20 (×3): qty 0.8

## 2017-07-20 NOTE — Progress Notes (Signed)
HEMATOLOGY-ONCOLOGY PROGRESS NOTE  SUBJECTIVE: Completed chemotherapy with carboplatin and Taxol yesterday Tolerated the treatment fairly well. Her major issues or lower extremity weakness  OBJECTIVE: REVIEW OF SYSTEMS:   Constitutional: Denies fevers, chills or abnormal weight loss Eyes: Denies blurriness of vision Ears, nose, mouth, throat, and face: Denies mucositis or sore throat Respiratory: Chronic shortness of breath Cardiovascular: Denies palpitation, chest discomfort Gastrointestinal:  Denies nausea, heartburn or change in bowel habits Skin: Denies abnormal skin rashes Lymphatics: Denies new lymphadenopathy or easy bruising Neurological: Lower extremity weakness patient is afraid of falling so she is not leaving the bed, physical therapy is working on Behavioral/Psych: Mood is stable, no new changes  Extremities: No lower extremity edema  All other systems were reviewed with the patient and are negative.  I have reviewed the past medical history, past surgical history, social history and family history with the patient and they are unchanged from previous note.   PHYSICAL EXAMINATION: ECOG PERFORMANCE STATUS: 1 - Symptomatic but completely ambulatory  Vitals:   07/19/17 2038 07/20/17 0448  BP:  113/78  Pulse:  73  Resp:  18  Temp:  98.2 F (36.8 C)  SpO2: 94% 96%   Filed Weights   07/18/17 1809  Weight: 178 lb (80.7 kg)    GENERAL:alert, no distress and comfortable SKIN: skin color, texture, turgor are normal, no rashes or significant lesions EYES: normal, Conjunctiva are pink and non-injected, sclera clear OROPHARYNX:no exudate, no erythema and lips, buccal mucosa, and tongue normal  NECK: supple, thyroid normal size, non-tender, without nodularity LYMPH:  no palpable lymphadenopathy in the cervical, axillary or inguinal LUNGS: Diminished breath sounds at the bases HEART: Atrial fibrillation ABDOMEN:abdomen soft, non-tender and normal bowel  sounds Musculoskeletal:no cyanosis of digits and no clubbing  NEURO: alert & oriented x 3 with fluent speech, Lower extremity weakness  LABORATORY DATA:  I have reviewed the data as listed CMP Latest Ref Rng & Units 07/18/2017 07/09/2017 07/08/2017  Glucose 65 - 99 mg/dL 148(H) 101(H) 108(H)  BUN 6 - 20 mg/dL 12 9 8   Creatinine 0.44 - 1.00 mg/dL 0.85 0.81 0.68  Sodium 135 - 145 mmol/L 136 136 134(L)  Potassium 3.5 - 5.1 mmol/L 3.6 3.4(L) 3.9  Chloride 101 - 111 mmol/L 101 103 103  CO2 22 - 32 mmol/L 27 27 25   Calcium 8.9 - 10.3 mg/dL 8.0(L) 7.5(L) 7.9(L)  Total Protein 6.5 - 8.1 g/dL 5.8(L) - 5.3(L)  Total Bilirubin 0.3 - 1.2 mg/dL 0.4 - 0.5  Alkaline Phos 38 - 126 U/L 64 - 37(L)  AST 15 - 41 U/L 24 - 17  ALT 14 - 54 U/L 25 - 11(L)    Lab Results  Component Value Date   WBC 12.7 (H) 07/18/2017   HGB 14.3 07/18/2017   HCT 43.5 07/18/2017   MCV 88.4 07/18/2017   PLT 272 07/18/2017   NEUTROABS 9.7 (H) 07/18/2017    ASSESSMENT AND PLAN: 1. Locally advanced ovarian cancer: Currently on neoadjuvant chemotherapy. She will receive daily Neupogen injections starting today. 2. acute and chronic COPD exacerbation: 3. Chronic A. Fib 4. DVT prophylaxis on Lovenox Plan is to discharge her to skilled nursing facility

## 2017-07-21 ENCOUNTER — Inpatient Hospital Stay (HOSPITAL_COMMUNITY): Payer: Medicare Other

## 2017-07-21 DIAGNOSIS — R05 Cough: Secondary | ICD-10-CM

## 2017-07-21 MED ORDER — CEFAZOLIN SODIUM-DEXTROSE 2-4 GM/100ML-% IV SOLN: 2.0000 g | INTRAVENOUS | Status: DC

## 2017-07-21 MED ORDER — ENOXAPARIN SODIUM 40 MG/0.4ML ~~LOC~~ SOLN
40.0000 mg | SUBCUTANEOUS | Status: DC
  Administered 2017-07-22: 40 mg via SUBCUTANEOUS
  Filled 2017-07-21: qty 0.4

## 2017-07-21 NOTE — Progress Notes (Signed)
  HEMATOLOGY-ONCOLOGY PROGRESS NOTE  SUBJECTIVE: Complains of a whitish phlegm in sputum. Leg weakness. S/P Carbo-Taxol Waiting for SNF placement Yesterday she felt well but today shes feeling weak.  OBJECTIVE: REVIEW OF SYSTEMS:   Constitutional: Denies fevers, chills or abnormal weight loss Eyes: Denies blurriness of vision Ears, nose, mouth, throat, and face: Denies mucositis or sore throat Respiratory: C/O cough with whitish sputum Cardiovascular: Denies palpitation, chest discomfort Gastrointestinal:  Denies nausea, heartburn or change in bowel habits Skin: Denies abnormal skin rashes Lymphatics: Denies new lymphadenopathy or easy bruising Neurological:Bil LE weaknesses Behavioral/Psych: Mood is stable, no new changes  Extremities: No lower extremity edema All other systems were reviewed with the patient and are negative.   PHYSICAL EXAMINATION: ECOG PERFORMANCE STATUS: 2 - Symptomatic, <50% confined to bed  Vitals:   07/21/17 0530 07/21/17 0938  BP: (!) 121/54   Pulse: 77   Resp: 16   Temp: 97.6 F (36.4 C)   SpO2: 97% 97%   Filed Weights   07/18/17 1809  Weight: 178 lb (80.7 kg)    GENERAL:alert, no distress and comfortable SKIN: skin color, texture, turgor are normal, no rashes or significant lesions EYES: normal, Conjunctiva are pink and non-injected, sclera clear OROPHARYNX:no exudate, no erythema and lips, buccal mucosa, and tongue normal  NECK: supple, thyroid normal size, non-tender, without nodularity LYMPH:  no palpable lymphadenopathy in the cervical, axillary or inguinal LUNGS: clear to auscultation HEART: A.Fib ABDOMEN:abdomen soft, non-tender and normal bowel sounds Musculoskeletal:no cyanosis of digits and no clubbing  NEURO: alert & oriented x 3 with fluent speech, Gen LE weakness  LABORATORY DATA:  I have reviewed the data as listed CMP Latest Ref Rng & Units 07/18/2017 07/09/2017 07/08/2017  Glucose 65 - 99 mg/dL 148(H) 101(H) 108(H)  BUN  6 - 20 mg/dL 12 9 8   Creatinine 0.44 - 1.00 mg/dL 0.85 0.81 0.68  Sodium 135 - 145 mmol/L 136 136 134(L)  Potassium 3.5 - 5.1 mmol/L 3.6 3.4(L) 3.9  Chloride 101 - 111 mmol/L 101 103 103  CO2 22 - 32 mmol/L 27 27 25   Calcium 8.9 - 10.3 mg/dL 8.0(L) 7.5(L) 7.9(L)  Total Protein 6.5 - 8.1 g/dL 5.8(L) - 5.3(L)  Total Bilirubin 0.3 - 1.2 mg/dL 0.4 - 0.5  Alkaline Phos 38 - 126 U/L 64 - 37(L)  AST 15 - 41 U/L 24 - 17  ALT 14 - 54 U/L 25 - 11(L)    Lab Results  Component Value Date   WBC 12.7 (H) 07/18/2017   HGB 14.3 07/18/2017   HCT 43.5 07/18/2017   MCV 88.4 07/18/2017   PLT 272 07/18/2017   NEUTROABS 9.7 (H) 07/18/2017    ASSESSMENT AND PLAN: 1. Locally advanced ovarian cancer: S/O neo-adj carbo-taxol. Feeling more tired today 2. Cough with sputum whitish: Will get chest Xray 3. COPD exac: No wheezing. 4. Chronic A'Fib Her IV is out and shes planning to get a port tomorrow (per nursing) She needs PT. Dr.Gorsuch will see tomorow

## 2017-07-22 ENCOUNTER — Inpatient Hospital Stay (HOSPITAL_COMMUNITY): Payer: Medicare Other

## 2017-07-22 ENCOUNTER — Encounter (HOSPITAL_COMMUNITY): Payer: Self-pay | Admitting: Interventional Radiology

## 2017-07-22 HISTORY — PX: IR US GUIDE VASC ACCESS RIGHT: IMG2390

## 2017-07-22 HISTORY — PX: IR FLUORO GUIDE PORT INSERTION RIGHT: IMG5741

## 2017-07-22 LAB — CBC WITH DIFFERENTIAL/PLATELET
BASOS ABS: 0 10*3/uL (ref 0.0–0.1)
Basophils Relative: 0 %
Eosinophils Absolute: 0 10*3/uL (ref 0.0–0.7)
Eosinophils Relative: 0 %
HEMATOCRIT: 44 % (ref 36.0–46.0)
HEMOGLOBIN: 14.4 g/dL (ref 12.0–15.0)
LYMPHS PCT: 2 %
Lymphs Abs: 0.9 10*3/uL (ref 0.7–4.0)
MCH: 28.8 pg (ref 26.0–34.0)
MCHC: 32.7 g/dL (ref 30.0–36.0)
MCV: 88 fL (ref 78.0–100.0)
MONOS PCT: 0 %
Monocytes Absolute: 0 10*3/uL — ABNORMAL LOW (ref 0.1–1.0)
NEUTROS PCT: 98 %
Neutro Abs: 42.1 10*3/uL — ABNORMAL HIGH (ref 1.7–7.7)
Platelets: 196 10*3/uL (ref 150–400)
RBC: 5 MIL/uL (ref 3.87–5.11)
RDW: 15.6 % — ABNORMAL HIGH (ref 11.5–15.5)
WBC: 43 10*3/uL — ABNORMAL HIGH (ref 4.0–10.5)

## 2017-07-22 LAB — PROTIME-INR
INR: 0.9
PROTHROMBIN TIME: 12 s (ref 11.4–15.2)

## 2017-07-22 MED ORDER — SENNOSIDES-DOCUSATE SODIUM 8.6-50 MG PO TABS
2.0000 | ORAL_TABLET | Freq: Two times a day (BID) | ORAL | Status: DC
  Administered 2017-07-22 – 2017-07-23 (×3): 2 via ORAL
  Filled 2017-07-22 (×4): qty 2

## 2017-07-22 MED ORDER — CEFAZOLIN SODIUM-DEXTROSE 2-4 GM/100ML-% IV SOLN
INTRAVENOUS | Status: AC
  Filled 2017-07-22: qty 100

## 2017-07-22 MED ORDER — POLYETHYLENE GLYCOL 3350 17 G PO PACK
17.0000 g | PACK | Freq: Every day | ORAL | Status: DC
  Administered 2017-07-22 – 2017-07-23 (×2): 17 g via ORAL
  Filled 2017-07-22 (×2): qty 1

## 2017-07-22 MED ORDER — FENTANYL CITRATE (PF) 100 MCG/2ML IJ SOLN
INTRAMUSCULAR | Status: AC | PRN
  Administered 2017-07-22 (×2): 50 ug via INTRAVENOUS

## 2017-07-22 MED ORDER — FENTANYL CITRATE (PF) 100 MCG/2ML IJ SOLN
INTRAMUSCULAR | Status: AC
  Filled 2017-07-22: qty 4

## 2017-07-22 MED ORDER — MIDAZOLAM HCL 2 MG/2ML IJ SOLN
INTRAMUSCULAR | Status: AC
  Filled 2017-07-22: qty 4

## 2017-07-22 MED ORDER — LIDOCAINE-EPINEPHRINE (PF) 2 %-1:200000 IJ SOLN
INTRAMUSCULAR | Status: AC
  Filled 2017-07-22: qty 20

## 2017-07-22 MED ORDER — CEFAZOLIN SODIUM-DEXTROSE 2-4 GM/100ML-% IV SOLN
2.0000 g | INTRAVENOUS | Status: AC
  Administered 2017-07-22: 2 g via INTRAVENOUS

## 2017-07-22 MED ORDER — LIDOCAINE-EPINEPHRINE (PF) 2 %-1:200000 IJ SOLN
INTRAMUSCULAR | Status: AC | PRN
  Administered 2017-07-22: 20 mL

## 2017-07-22 MED ORDER — MIDAZOLAM HCL 2 MG/2ML IJ SOLN
INTRAMUSCULAR | Status: AC | PRN
  Administered 2017-07-22 (×2): 1 mg via INTRAVENOUS

## 2017-07-22 NOTE — Progress Notes (Signed)
[name]   DOB:[birth year]   JK#:[id number]    Subjective: She tolerated chemotherapy well.  She complain of fatigue after treatment, likely due to steroid withdrawal She is constipated, no bowel movement in 3 days Denies new cough.  Objective:  Vitals:   07/21/17 2131 07/22/17 0536  BP:  121/62  Pulse:  88  Resp:  16  Temp:  98.4 F (36.9 C)  SpO2: 98% 98%     Intake/Output Summary (Last 24 hours) at 07/22/17 0754 Last data filed at 07/21/17 1840  Gross per 24 hour  Intake              480 ml  Output              725 ml  Net             -245 ml    GENERAL:alert, no distress and comfortable SKIN: skin color, texture, turgor are normal, no rashes or significant lesions EYES: normal, Conjunctiva are pink and non-injected, sclera clear OROPHARYNX:no exudate, no erythema and lips, buccal mucosa, and tongue normal  NECK: supple, thyroid normal size, non-tender, without nodularity LYMPH:  no palpable lymphadenopathy in the cervical, axillary or inguinal LUNGS: Reduced breath sounds on the right lung base with normal breathing effort.  She has oxygen via nasal cannula HEART: regular rate & rhythm and no murmurs and no lower extremity edema ABDOMEN:abdomen soft, non-tender and normal bowel sounds Musculoskeletal:no cyanosis of digits and no clubbing  NEURO: alert & oriented x 3 with fluent speech, no focal motor/sensory deficits   Labs:  Lab Results  Component Value Date   WBC 12.7 (H) 07/18/2017   HGB 14.3 07/18/2017   HCT 43.5 07/18/2017   MCV 88.4 07/18/2017   PLT 272 07/18/2017   NEUTROABS 9.7 (H) 07/18/2017    Lab Results  Component Value Date   NA 136 07/18/2017   K 3.6 07/18/2017   CL 101 07/18/2017   CO2 27 07/18/2017    Studies:  Dg Chest 2 View  Result Date: 07/21/2017 CLINICAL DATA:  Productive cough. History of ovarian carcinoma with current chemotherapy. COPD exacerbation. EXAM: CHEST  2 VIEW COMPARISON:  07/19/2017 FINDINGS: The heart size and  mediastinal contours are within normal limits. Increased opacity at the right lung base likely mostly relates to pleural fluid. However, there may be a component of pneumonia present. Small left pleural effusion present. The visualized skeletal structures are unremarkable. IMPRESSION: Increased opacity at the right lung base since the prior radiograph 2 days ago. This is felt to mostly relate to a moderate pleural effusion. However, component of pneumonia may be present. A small left pleural effusion remains present. Electronically Signed   By: Aletta Edouard M.D.   On: 07/21/2017 12:28    Assessment & Plan:   Locally advanced ovarian cancer The patient has locally advanced ovarian cancer She is recommended neoadjuvant chemotherapy due to her general debility She had received her first dose of carboplatin and Taxol on July 19, 2017. She is receiving G-CSF support daily I recommend we continue until she is discharged  Acute on chronic COPD exacerbation She has mild respiratory failure due to progressive abdominal distention causing difficulty takingin deep breaths She will continue oxygen therapy and inhalers I do not believe the patient have a new infection Chest x-ray show no evidence of pneumonia but evidence of small pleural effusion.  Continue conservative management and incentive spirometry  Malignant ascites Ultrasound did not detect significant fluid for paracentesis on  July 19, 2017. Hopefully, with chemotherapy, malignant ascites will not recur  Chronic atrial fibrillation I will hold Xarelto in anticipation for invasive procedures Plan to resume Xarelto next week after port placement  Chronic nausea, resolved This is secondary to untreated cancer She will continue antiemetics  Severe constipation I will start her on MiraLAX and Senokot  Insomnia According to her, it is due to difficulties with breathing secondary to ascites I would give her anxiolytics while  admitted  DVT prophylaxis I will put her on Lovenox  Generalized weakness and debility This is due to untreated cancer I will consult physical therapy for assessment  Poor social circumstances She lives alone The patient is at high risk of fall, infection and risk of readmission to the hospital She has very poor social circumstances I recommend social worker consult to look for skilled nursing facility I think she would benefit from active rehabilitation and skilled nursing facility over the next 3 months until she completes neoadjuvant chemotherapy and surgery if possible  CODE STATUS We discussed goals of care We discussed CODE STATUS The patient desire full code I recommend consulting social worker for advanced directive documentation She appointed her daughter, [name] as her healthcare power of attorney  Discharge planning Awaiting skilled nursing facility placement  Laura Lark, MD 07/22/2017  7:54 AM

## 2017-07-22 NOTE — Clinical Social Work Placement (Addendum)
10:37 AM Discussed with daughter bed offers. Hopeful for first choice: [facility].  Patient does have other offers, however daughter [name] not willing to explore unless necessary. If no bed available at time of DC, will discuss other options if needed.  CLINICAL SOCIAL WORK PLACEMENT  NOTE  Date:  07/22/2017  Patient Details  Name: [name] MRN: [id number] Date of Birth: October 10, 1932  Clinical Social Work is seeking post-discharge placement for this patient at the [place] level of care (*CSW will initial, date and re-position this form in  chart as items are completed):  Yes   Patient/family provided with [facility] Work Department's list of facilities offering this level of care within the geographic area requested by the patient (or if unable, by the patient's family).  Yes   Patient/family informed of their freedom to choose among providers that offer the needed level of care, that participate in Medicare, Medicaid or managed care program needed by the patient, have an available bed and are willing to accept the patient.  Yes   Patient/family informed of [facility]'s ownership interest in [facility] and [facility], as well as of the fact that they are under no obligation to receive care at these facilities.  PASRR submitted to EDS on       PASRR number received on       Existing PASRR number confirmed on 07/22/17     FL2 transmitted to all facilities in geographic area requested by pt/family on 07/22/17     FL2 transmitted to all facilities within larger geographic area on       Patient informed that his/her managed care company has contracts with or will negotiate with certain facilities, including the following:        Yes   Patient/family informed of bed offers received.  Patient chooses bed at [facility]     Physician recommends and patient chooses bed at [facility]    Patient to be transferred to   on  .  Patient  to be transferred to facility by       Patient family notified on   of transfer.  Name of family member notified:        PHYSICIAN Please sign FL2     Additional Comment:    _______________________________________________ Lilly [place], LCSW 07/22/2017, 10:37 AM

## 2017-07-23 DIAGNOSIS — R5381 Other malaise: Secondary | ICD-10-CM

## 2017-07-23 LAB — CBC WITH DIFFERENTIAL/PLATELET
BASOS ABS: 0 10*3/uL (ref 0.0–0.1)
Basophils Relative: 0 %
EOS PCT: 1 %
Eosinophils Absolute: 0.3 10*3/uL (ref 0.0–0.7)
HCT: 41.7 % (ref 36.0–46.0)
Hemoglobin: 13.7 g/dL (ref 12.0–15.0)
Lymphocytes Relative: 4 %
Lymphs Abs: 1 10*3/uL (ref 0.7–4.0)
MCH: 29.1 pg (ref 26.0–34.0)
MCHC: 32.9 g/dL (ref 30.0–36.0)
MCV: 88.7 fL (ref 78.0–100.0)
MONO ABS: 0 10*3/uL — AB (ref 0.1–1.0)
Monocytes Relative: 0 %
NEUTROS PCT: 95 %
Neutro Abs: 24 10*3/uL — ABNORMAL HIGH (ref 1.7–7.7)
Platelets: 179 10*3/uL (ref 150–400)
RBC: 4.7 MIL/uL (ref 3.87–5.11)
RDW: 15.3 % (ref 11.5–15.5)
WBC: 25.3 10*3/uL — AB (ref 4.0–10.5)

## 2017-07-23 LAB — COMPREHENSIVE METABOLIC PANEL
ALBUMIN: 2.3 g/dL — AB (ref 3.5–5.0)
ALK PHOS: 60 U/L (ref 38–126)
ALT: 23 U/L (ref 14–54)
AST: 28 U/L (ref 15–41)
Anion gap: 6 (ref 5–15)
BILIRUBIN TOTAL: 0.9 mg/dL (ref 0.3–1.2)
BUN: 15 mg/dL (ref 6–20)
CALCIUM: 7.5 mg/dL — AB (ref 8.9–10.3)
CO2: 29 mmol/L (ref 22–32)
Chloride: 99 mmol/L — ABNORMAL LOW (ref 101–111)
Creatinine, Ser: 0.54 mg/dL (ref 0.44–1.00)
GFR calc Af Amer: >60 mL/min (ref >60)
GFR calc non Af Amer: >60 mL/min (ref >60)
GLUCOSE: 97 mg/dL (ref 65–99)
POTASSIUM: 4.5 mmol/L (ref 3.5–5.1)
Sodium: 134 mmol/L — ABNORMAL LOW (ref 135–145)
TOTAL PROTEIN: 4.9 g/dL — AB (ref 6.5–8.1)

## 2017-07-23 MED ORDER — APIXABAN 2.5 MG PO TABS: 2.5000 mg | ORAL_TABLET | Freq: Two times a day (BID) | ORAL | Status: DC

## 2017-07-23 MED ORDER — MORPHINE SULFATE 2 MG/ML IJ SOLN: 2.0000 mg | INTRAMUSCULAR | Status: DC | PRN

## 2017-07-23 MED ORDER — OXYCODONE HCL 5 MG PO TABS
5.0000 mg | ORAL_TABLET | ORAL | Status: DC | PRN
  Administered 2017-07-23 – 2017-07-24 (×4): 5 mg via ORAL
  Filled 2017-07-23 (×3): qty 1

## 2017-07-23 MED ORDER — LORATADINE 10 MG PO TABS
10.0000 mg | ORAL_TABLET | Freq: Every day | ORAL | Status: DC
  Administered 2017-07-23 – 2017-07-24 (×2): 10 mg via ORAL
  Filled 2017-07-23 (×2): qty 1

## 2017-07-23 MED ORDER — MORPHINE SULFATE (PF) 4 MG/ML IV SOLN
2.0000 mg | INTRAVENOUS | Status: DC | PRN
  Administered 2017-07-23 – 2017-07-24 (×3): 4 mg via INTRAVENOUS
  Filled 2017-07-23 (×3): qty 1

## 2017-07-23 MED ORDER — RIVAROXABAN 20 MG PO TABS
20.0000 mg | ORAL_TABLET | Freq: Every day | ORAL | Status: DC
  Administered 2017-07-23: 20 mg via ORAL
  Filled 2017-07-23: qty 1

## 2017-07-23 NOTE — Care Management Important Message (Signed)
Important Message  Patient Details  Name: LATIANA TOMEI MRN: [id number] Date of Birth: 1933-05-09   Medicare Important Message Given:  Yes    Kerin Salen 07/23/2017, 12:23 [place] Message  Patient Details  Name: ROXANNA MCEVER MRN: [id number] Date of Birth: [birth year]   Medicare Important Message Given:  Yes    Kerin Salen 07/23/2017, 12:23 PM

## 2017-07-23 NOTE — Progress Notes (Signed)
[name]   DOB:[birth year]   DJ#:[id number]    Subjective: She had uneventful port placement.  She had small amount of bowel movement but remained constipated somewhat.  She have some achiness in her arms and legs, likely due to recent G-CSF.  No recent cough  Objective:  Vitals:   07/22/17 2017 07/23/17 0517  BP: 134/67 (!) 140/56  Pulse: 96 97  Resp: 18 19  Temp: 98 F (36.7 C) (!) 97.4 F (36.3 C)  SpO2: 95% 95%     Intake/Output Summary (Last 24 hours) at 07/23/17 0751 Last data filed at 07/22/17 2115  Gross per 24 hour  Intake              460 ml  Output                0 ml  Net              460 ml    GENERAL:alert, no distress and comfortable SKIN: skin color, texture, turgor are normal, no rashes or significant lesions EYES: normal, Conjunctiva are pink and non-injected, sclera clear OROPHARYNX:no exudate, no erythema and lips, buccal mucosa, and tongue normal  NECK: supple, thyroid normal size, non-tender, without nodularity LYMPH:  no palpable lymphadenopathy in the cervical, axillary or inguinal LUNGS: clear to auscultation and percussion with normal breathing effort HEART: regular rate & rhythm and no murmurs and no lower extremity edema ABDOMEN:abdomen soft, non-tender and normal bowel sounds Musculoskeletal:no cyanosis of digits and no clubbing  NEURO: alert & oriented x 3 with fluent speech, no focal motor/sensory deficits   Labs:  Lab Results  Component Value Date   WBC 25.3 (H) 07/23/2017   HGB 13.7 07/23/2017   HCT 41.7 07/23/2017   MCV 88.7 07/23/2017   PLT 179 07/23/2017   NEUTROABS 24.0 (H) 07/23/2017    Lab Results  Component Value Date   NA 134 (L) 07/23/2017   K 4.5 07/23/2017   CL 99 (L) 07/23/2017   CO2 29 07/23/2017    Studies:  Dg Chest 2 View  Result Date: 07/21/2017 CLINICAL DATA:  Productive cough. History of ovarian carcinoma with current chemotherapy. COPD exacerbation. EXAM: CHEST  2 VIEW COMPARISON:  07/19/2017  FINDINGS: The heart size and mediastinal contours are within normal limits. Increased opacity at the right lung base likely mostly relates to pleural fluid. However, there may be a component of pneumonia present. Small left pleural effusion present. The visualized skeletal structures are unremarkable. IMPRESSION: Increased opacity at the right lung base since the prior radiograph 2 days ago. This is felt to mostly relate to a moderate pleural effusion. However, component of pneumonia may be present. A small left pleural effusion remains present. Electronically Signed   By: Aletta Edouard M.D.   On: 07/21/2017 12:28   Ir US Guide Vasc Access Right  Result Date: 07/22/2017 INDICATION: History of ovarian cancer, in need of durable intravenous access for chemotherapy administration. EXAM: IMPLANTED PORT A CATH PLACEMENT WITH ULTRASOUND AND FLUOROSCOPIC GUIDANCE COMPARISON:  None. MEDICATIONS: Ancef 2 gm IV; The antibiotic was administered within an appropriate time interval prior to skin puncture. ANESTHESIA/SEDATION: Moderate (conscious) sedation was employed during this procedure. A total of Versed 2 mg and Fentanyl 100 mcg was administered intravenously. Moderate Sedation Time: 21 minutes. The patient's level of consciousness and vital signs were monitored continuously by radiology nursing throughout the procedure under my direct supervision. CONTRAST:  None FLUOROSCOPY TIME:  18 seconds (9 mGy) COMPLICATIONS: None immediate.  PROCEDURE: The procedure, risks, benefits, and alternatives were explained to the patient. Questions regarding the procedure were encouraged and answered. The patient understands and consents to the procedure. The right neck and chest were prepped with chlorhexidine in a sterile fashion, and a sterile drape was applied covering the operative field. Maximum barrier sterile technique with sterile gowns and gloves were used for the procedure. A timeout was performed prior to the initiation of  the procedure. Local anesthesia was provided with 1% lidocaine with epinephrine. After creating a small venotomy incision, a micropuncture kit was utilized to access the internal jugular vein. Real-time ultrasound guidance was utilized for vascular access including the acquisition of a permanent ultrasound image documenting patency of the accessed vessel. The microwire was utilized to measure appropriate catheter length. A subcutaneous port pocket was then created along the upper chest wall utilizing a combination of sharp and blunt dissection. The pocket was irrigated with sterile saline. A single lumen ISP power injectable port was chosen for placement. The 8 Fr catheter was tunneled from the port pocket site to the venotomy incision. The port was placed in the pocket. The external catheter was trimmed to appropriate length. At the venotomy, an 8 Fr peel-away sheath was placed over a guidewire under fluoroscopic guidance. The catheter was then placed through the sheath and the sheath was removed. Final catheter positioning was confirmed and documented with a fluoroscopic spot radiograph. The port was accessed with a Huber needle, aspirated and flushed. The venotomy site was closed with an interrupted 4-0 Vicryl suture. The port pocket incision was closed with interrupted 2-0 Vicryl suture and the skin was opposed with a running subcuticular 4-0 Vicryl suture. Dermabond and Steri-strips were applied to both incisions. The Port a Catheter was left accessed for in-hospital utilization. Dressings were placed. The patient tolerated the procedure well without immediate post procedural complication. FINDINGS: After catheter placement, the tip lies within the superior cavoatrial junction. The catheter aspirates and flushes normally and is ready for immediate use. IMPRESSION: Successful placement of a right internal jugular approach power injectable Port-A-Cath. The Port a Catheter was left accessed for potential immediate  in-hospital utilization. Electronically Signed   By: Sandi Mariscal M.D.   On: 07/22/2017 14:05   Ir Fluoro Guide Port Insertion Right  Result Date: 07/22/2017 INDICATION: History of ovarian cancer, in need of durable intravenous access for chemotherapy administration. EXAM: IMPLANTED PORT A CATH PLACEMENT WITH ULTRASOUND AND FLUOROSCOPIC GUIDANCE COMPARISON:  None. MEDICATIONS: Ancef 2 gm IV; The antibiotic was administered within an appropriate time interval prior to skin puncture. ANESTHESIA/SEDATION: Moderate (conscious) sedation was employed during this procedure. A total of Versed 2 mg and Fentanyl 100 mcg was administered intravenously. Moderate Sedation Time: 21 minutes. The patient's level of consciousness and vital signs were monitored continuously by radiology nursing throughout the procedure under my direct supervision. CONTRAST:  None FLUOROSCOPY TIME:  18 seconds (9 mGy) COMPLICATIONS: None immediate. PROCEDURE: The procedure, risks, benefits, and alternatives were explained to the patient. Questions regarding the procedure were encouraged and answered. The patient understands and consents to the procedure. The right neck and chest were prepped with chlorhexidine in a sterile fashion, and a sterile drape was applied covering the operative field. Maximum barrier sterile technique with sterile gowns and gloves were used for the procedure. A timeout was performed prior to the initiation of the procedure. Local anesthesia was provided with 1% lidocaine with epinephrine. After creating a small venotomy incision, a micropuncture kit was utilized to access  the internal jugular vein. Real-time ultrasound guidance was utilized for vascular access including the acquisition of a permanent ultrasound image documenting patency of the accessed vessel. The microwire was utilized to measure appropriate catheter length. A subcutaneous port pocket was then created along the upper chest wall utilizing a combination of  sharp and blunt dissection. The pocket was irrigated with sterile saline. A single lumen ISP power injectable port was chosen for placement. The 8 Fr catheter was tunneled from the port pocket site to the venotomy incision. The port was placed in the pocket. The external catheter was trimmed to appropriate length. At the venotomy, an 8 Fr peel-away sheath was placed over a guidewire under fluoroscopic guidance. The catheter was then placed through the sheath and the sheath was removed. Final catheter positioning was confirmed and documented with a fluoroscopic spot radiograph. The port was accessed with a Huber needle, aspirated and flushed. The venotomy site was closed with an interrupted 4-0 Vicryl suture. The port pocket incision was closed with interrupted 2-0 Vicryl suture and the skin was opposed with a running subcuticular 4-0 Vicryl suture. Dermabond and Steri-strips were applied to both incisions. The Port a Catheter was left accessed for in-hospital utilization. Dressings were placed. The patient tolerated the procedure well without immediate post procedural complication. FINDINGS: After catheter placement, the tip lies within the superior cavoatrial junction. The catheter aspirates and flushes normally and is ready for immediate use. IMPRESSION: Successful placement of a right internal jugular approach power injectable Port-A-Cath. The Port a Catheter was left accessed for potential immediate in-hospital utilization. Electronically Signed   By: Sandi Mariscal M.D.   On: 07/22/2017 14:05    Assessment & Plan:   Locally advanced ovarian cancer The patient has locally advanced ovarian cancer She is recommended neoadjuvant chemotherapy due to her general debility She had received her first dose of carboplatin and Taxol on July 19, 2017. Her white count is high.  Will stop G-CSF  Acute on chronic COPD exacerbation She has mild respiratory failure due to progressive abdominal distention causing  difficulty takingin deep breaths She will continue oxygen therapy and inhalers I do not believe the patient have a new infection Chest x-ray show no evidence of pneumonia but evidence of small pleural effusion.  Continue conservative management and incentive spirometry  Malignant ascites Ultrasound did not detect significant fluid for paracentesis on July 19, 2017. Hopefully, with chemotherapy, malignant ascites will not recur  Chronic atrial fibrillation We will stop aspirin and Lovenox, resume Xarelto  Chronic nausea, resolved This is secondary to untreated cancer She will continue antiemetics  Severe constipation I will start her on MiraLAX and Senokot Even though she had small bowel movement today, we will continue the same for now  Insomnia According to her, it is due to difficulties with breathing secondary to ascites I would give her anxiolytics while admitted  DVT prophylaxis We will start Xarelto as above  Generalized weakness and debility This is due to untreated cancer I will consult physical therapy for assessment  Poor social circumstances She lives alone The patient is at high risk of fall, infection and risk of readmission to the hospital She has very poor social circumstances I recommend social worker consult to look for skilled nursing facility I think she would benefit from active rehabilitation and skilled nursing facility over the next 3 months until she completes neoadjuvant chemotherapy and surgery if possible  CODE STATUS We discussed goals of care We discussed CODE STATUS The patient desire  full code I recommend consulting social worker for advanced directive documentation She appointed her daughter, [name] as her healthcare power of attorney  Discharge planning Awaiting skilled nursing facility placement  Heath Lark, MD 07/23/2017  7:51 AM

## 2017-07-23 NOTE — Progress Notes (Addendum)
Physical Therapy Treatment Patient Details Name: [name] MRN: [id number] DOB: [birth year] Today's Date: 07/23/2017    History of Present Illness 83 yo female adm with ascites, resp distress; history of ovarian cancer with recurrent malignant ascites, COPD, paroxysmal atrial fibrillation, irritable bowel syndrome and psoriatic arthritis    PT Comments    Pt incr tolerance to activity, able to amb greater distance this session(O2 sats 99% on 2L, HR 94%); continue to recommend SNF post acute to incr pt IND and decr risk of falls prior to return home; will continue to follow  Follow Up Recommendations  SNF     Equipment Recommendations  None recommended by PT    Recommendations for Other Services       Precautions / Restrictions Precautions Precautions: Fall Precaution Comments: O2 at home x1wk prior to this adm Restrictions Weight Bearing Restrictions: No    Mobility  Bed Mobility Overal bed mobility: Needs Assistance Bed Mobility: Supine to Sit     Supine to sit: Supervision;HOB elevated     General bed mobility comments: incr time, heavy use of rail; pt does not tolerate lying flat  Transfers Overall transfer level: Needs assistance Equipment used: Rolling walker (2 wheeled) Transfers: Sit to/from Stand Sit to Stand: Min guard         General transfer comment: cues for hand placement  Ambulation/Gait Ambulation/Gait assistance: Min guard Ambulation Distance (Feet): 85 Feet (10' to bathroom) Assistive device: Rolling walker (2 wheeled) Gait Pattern/deviations: Step-through pattern;Decreased stride length     General Gait Details: cues for posture/trunk extension and RW position from self   Stairs            Wheelchair Mobility    Modified Rankin (Stroke Patients Only)       Balance                                            Cognition Arousal/Alertness: Awake/alert Behavior During Therapy: WFL for tasks  assessed/performed Overall Cognitive Status: Within Functional Limits for tasks assessed                                        Exercises General Exercises - Lower Extremity Ankle Circles/Pumps: AROM;Both;10 reps Other Exercises Other Exercises: encouraged AROM all joints UE/LE d/t pain in all joints from meds    General Comments        Pertinent Vitals/Pain Pain Assessment: Faces Faces Pain Scale: Hurts little more Pain Location: all joints (d/t meds-Neulasta) Pain Descriptors / Indicators: Aching Pain Intervention(s): Monitored during session;Repositioned    Home Living                      Prior Function            PT Goals (current goals can now be found in the care plan section) Acute Rehab PT Goals Patient Stated Goal: get a lot stronger and have confidence to go out shopping (without fear of falling) PT Goal Formulation: With patient Time For Goal Achievement: 07/26/17 Potential to Achieve Goals: Good Progress towards PT goals: Progressing toward goals    Frequency    Min 3X/week      PT Plan Current plan remains appropriate    Co-evaluation  AM-PAC PT "6 Clicks" Daily Activity  Outcome Measure  Difficulty turning over in bed (including adjusting bedclothes, sheets and blankets)?: Unable Difficulty moving from lying on back to sitting on the side of the bed? : Unable Difficulty sitting down on and standing up from a chair with arms (e.g., wheelchair, bedside commode, etc,.)?: Unable Help needed moving to and from a bed to chair (including a wheelchair)?: A Little Help needed walking in hospital room?: A Little Help needed climbing 3-5 steps with a railing? : A Little 6 Click Score: 12    End of Session Equipment Utilized During Treatment: Gait belt;Oxygen Activity Tolerance: Patient tolerated treatment well Patient left: in chair;with call bell/phone within reach;with family/visitor present   PT Visit  Diagnosis: Unsteadiness on feet (R26.81);Difficulty in walking, not elsewhere classified (R26.2)     Time: 4076-8088 PT Time Calculation (min) (ACUTE ONLY): 18 min  Charges:  $Gait Training: 8-22 mins                    G CodesKenyon Ana, PT Pager: [phone] 07/23/2017    Kenyon Ana 07/23/2017, 12:16 PM

## 2017-07-23 NOTE — Progress Notes (Signed)
LCSW following for disposition:  SNF at discharge   Patient has a bed at [facility] (07/24/17) per admissions at [place] with disposition once patient medically stable for discharge  Plan SNF.  Bed available 10/17  Lane Hacker, MSW Clinical Social Work: System Wide Float Coverage for :  [phone]

## 2017-07-23 NOTE — Consult Note (Signed)
   [facility] CM Inpatient Consult   07/23/2017  ZHURI KRASS [birth year] [id number]    Patient screened for potential [facility] Care Management services.   Chart reviewed. Noted Ms. Tenny's discharge plan is for SNF.  No identifiable [facility] Care Management needs at this time.    Marthenia Rolling, MSN-Ed, RN,BSN [facility] Liaison [phone]

## 2017-07-24 ENCOUNTER — Telehealth: Payer: Self-pay | Admitting: *Deleted

## 2017-07-24 ENCOUNTER — Other Ambulatory Visit: Payer: Self-pay | Admitting: Hematology and Oncology

## 2017-07-24 MED ORDER — LOPERAMIDE HCL 2 MG PO CAPS: 4.0000 mg | ORAL_CAPSULE | Freq: Three times a day (TID) | ORAL | 0 refills | Status: DC

## 2017-07-24 MED ORDER — LORATADINE 10 MG PO TABS: 10.0000 mg | ORAL_TABLET | Freq: Every day | ORAL | 0 refills | Status: DC

## 2017-07-24 MED ORDER — SENNOSIDES-DOCUSATE SODIUM 8.6-50 MG PO TABS: 1.0000 | ORAL_TABLET | Freq: Every day | ORAL | 0 refills | Status: DC

## 2017-07-24 MED ORDER — LIP MEDEX EX OINT
TOPICAL_OINTMENT | CUTANEOUS | Status: AC
  Administered 2017-07-24: 10:00:00
  Filled 2017-07-24: qty 7

## 2017-07-24 MED ORDER — LIP MEDEX EX OINT
TOPICAL_OINTMENT | CUTANEOUS | Status: AC
  Filled 2017-07-24: qty 7

## 2017-07-24 MED ORDER — ALPRAZOLAM 0.25 MG PO TABS: 0.2500 mg | ORAL_TABLET | Freq: Every evening | ORAL | 0 refills | Status: DC | PRN

## 2017-07-24 MED ORDER — LOPERAMIDE HCL 2 MG PO CAPS
4.0000 mg | ORAL_CAPSULE | Freq: Three times a day (TID) | ORAL | Status: DC
  Administered 2017-07-24: 4 mg via ORAL
  Filled 2017-07-24: qty 2

## 2017-07-24 NOTE — Telephone Encounter (Signed)
-----   Message from Heath Lark, MD sent at 07/24/2017  8:15 AM EDT ----- Regarding: call daughter [name] She will likely be DC today to a SNF. I need to schedule chemo class, labs, see me next week and inj appt on 10/23. Her next chemo would be due on 11/2  Her daughter, [name] cares for a disabled child so have limited time availability. Can you call her and inquire when she can bring the patient here on 10/23? Let me know and I will place scheduling msg  Thanks

## 2017-07-24 NOTE — Telephone Encounter (Signed)
Spoke with [name]. Is OK to come on 10/23 and chemo of 11/2.   Message to scheduler

## 2017-07-24 NOTE — Progress Notes (Signed)
Physical Therapy Treatment Patient Details Name: [name] MRN: [id number] DOB: 1933-10-01 Today's Date: 07/24/2017    History of Present Illness 83 yo female adm with ascites, resp distress; history of ovarian cancer with recurrent malignant ascites, COPD, paroxysmal atrial fibrillation, irritable bowel syndrome and psoriatic arthritis    PT Comments    Pt progressing with mobility but continues to move slowly and fatigue easily.  This am, pt ambulated on RA and maintained SaO2 at 94%.   Follow Up Recommendations  SNF     Equipment Recommendations  None recommended by PT    Recommendations for Other Services       Precautions / Restrictions Precautions Precautions: Fall Precaution Comments: O2 at home x1wk prior to this adm Restrictions Weight Bearing Restrictions: No    Mobility  Bed Mobility               General bed mobility comments: Pt OOB and requests back to chair  Transfers Overall transfer level: Needs assistance Equipment used: Rolling walker (2 wheeled) Transfers: Sit to/from Stand Sit to Stand: Min guard         General transfer comment: cues for hand placement  Ambulation/Gait Ambulation/Gait assistance: Min guard Ambulation Distance (Feet): 175 Feet Assistive device: Rolling walker (2 wheeled) Gait Pattern/deviations: Step-through pattern;Decreased stride length Gait velocity: decr   General Gait Details: Increased time with cues for posture/trunk extension and RW position from self   Stairs            Wheelchair Mobility    Modified Rankin (Stroke Patients Only)       Balance Overall balance assessment: Needs assistance Sitting-balance support: No upper extremity supported;Feet supported Sitting balance-Leahy Scale: Good     Standing balance support: Single extremity supported Standing balance-Leahy Scale: Poor Standing balance comment: unsteady without at least unilateral UE support                             Cognition Arousal/Alertness: Awake/alert Behavior During Therapy: WFL for tasks assessed/performed Overall Cognitive Status: Within Functional Limits for tasks assessed                                        Exercises      General Comments        Pertinent Vitals/Pain      Home Living                      Prior Function            PT Goals (current goals can now be found in the care plan section) Acute Rehab PT Goals Patient Stated Goal: get a lot stronger and have confidence to go out shopping (without fear of falling) PT Goal Formulation: With patient Time For Goal Achievement: 07/26/17 Potential to Achieve Goals: Good Progress towards PT goals: Progressing toward goals    Frequency    Min 3X/week      PT Plan Current plan remains appropriate    Co-evaluation              AM-PAC PT "6 Clicks" Daily Activity  Outcome Measure  Difficulty turning over in bed (including adjusting bedclothes, sheets and blankets)?: Unable Difficulty moving from lying on back to sitting on the side of the bed? : Unable Difficulty sitting down on and standing up from  a chair with arms (e.g., wheelchair, bedside commode, etc,.)?: Unable Help needed moving to and from a bed to chair (including a wheelchair)?: A Little Help needed walking in hospital room?: A Little Help needed climbing 3-5 steps with a railing? : A Little 6 Click Score: 12    End of Session Equipment Utilized During Treatment: Gait belt Activity Tolerance: Patient tolerated treatment well Patient left: in chair;with call bell/phone within reach;with family/visitor present Nurse Communication: Mobility status;Other (comment) (SaO2 with ambulation 94%+) PT Visit Diagnosis: Unsteadiness on feet (R26.81);Difficulty in walking, not elsewhere classified (R26.2)     Time: 8867-7373 PT Time Calculation (min) (ACUTE ONLY): 12 min  Charges:  $Gait Training: 8-22 mins                     G Codes:       Pg [phone]    Clessie Karras 07/24/2017, 12:25 PM

## 2017-07-24 NOTE — Discharge Summary (Signed)
Physician Discharge Summary  Patient ID: [name] MRN: [id number] DOB/AGE: [birth year] 83 y.o.  Admit date: 07/18/2017 Discharge date: 07/24/2017  Primary Care Physician:  Asencion Noble, MD   Discharge Diagnoses:    Present on Admission: . Ovarian CA, right ([place]) . COPD (chronic obstructive pulmonary disease) ([place]) . ABDOMINAL PAIN -GENERALIZED . Diarrhea . Bloating . Upper abdominal pain . Muscle weakness (generalized) . Atrial fibrillation ([place]) . Ascites, malignant   Discharge Medications:  Allergies as of 07/24/2017      Reactions   Codeine Itching      Medication List    STOP taking these medications   furosemide 20 MG tablet Commonly known as:  LASIX     TAKE these medications   albuterol 108 (90 Base) MCG/ACT inhaler Commonly known as:  PROAIR HFA Inhale 2 puffs into the lungs every 6 (six) hours as needed for wheezing or shortness of breath.   ALPRAZolam 0.25 MG tablet Commonly known as:  XANAX Take 0.25 mg by mouth at bedtime as needed for anxiety.   azelastine 0.1 % nasal spray Commonly known as:  ASTELIN Place 2 sprays into both nostrils 2 (two) times daily. Use in each nostril as directed   budesonide-formoterol 160-4.5 MCG/ACT inhaler Commonly known as:  SYMBICORT Inhale 2 puffs into the lungs 2 (two) times daily.   diltiazem 120 MG 24 hr capsule Commonly known as:  CARTIA XT TAKE 1 CAPSULE BY MOUTH EVERY DAY   diphenoxylate-atropine 2.5-0.025 MG tablet Commonly known as:  LOMOTIL Take 1 tablet by mouth 4 (four) times daily. What changed:  when to take this  reasons to take this   fluticasone 50 MCG/ACT nasal spray Commonly known as:  FLONASE Place 2 sprays into both nostrils 2 (two) times daily. For nasal congestion   guaiFENesin-dextromethorphan 100-10 MG/5ML syrup Commonly known as:  ROBITUSSIN DM Take 5 mLs by mouth every 4 (four) hours as needed for cough.   levothyroxine 137 MCG tablet Commonly known as:   SYNTHROID, LEVOTHROID Take 137 mcg by mouth every morning. For thyroid therapy   loratadine 10 MG tablet Commonly known as:  CLARITIN Take 1 tablet (10 mg total) by mouth daily.   MAGNESIUM GLYCINATE PLUS PO Take 400 mg by mouth daily.   methylcellulose 1 % ophthalmic solution Commonly known as:  ARTIFICIAL TEARS Place 1 drop into both eyes 2 (two) times daily as needed. Dry eyes   prochlorperazine 10 MG tablet Commonly known as:  COMPAZINE Take 10 mg by mouth every 6 (six) hours as needed for nausea.   senna-docusate 8.6-50 MG tablet Commonly known as:  Senokot-S Take 1 tablet by mouth at bedtime.   Vitamin D 2000 units Caps Take 2,000 Units by mouth daily.   XARELTO 20 MG Tabs tablet Generic drug:  rivaroxaban TAKE 1 TABLET BY MOUTH EVERY DAY WITH SUPPER       Disposition and Follow-up:   Significant Diagnostic Studies:  Dg Chest 2 View  Result Date: 07/21/2017 CLINICAL DATA:  Productive cough. History of ovarian carcinoma with current chemotherapy. COPD exacerbation. EXAM: CHEST  2 VIEW COMPARISON:  07/19/2017 FINDINGS: The heart size and mediastinal contours are within normal limits. Increased opacity at the right lung base likely mostly relates to pleural fluid. However, there may be a component of pneumonia present. Small left pleural effusion present. The visualized skeletal structures are unremarkable. IMPRESSION: Increased opacity at the right lung base since the prior radiograph 2 days ago. This is felt to mostly relate  to a moderate pleural effusion. However, component of pneumonia may be present. A small left pleural effusion remains present. Electronically Signed   By: Aletta Edouard M.D.   On: 07/21/2017 12:28   Dg Chest 2 View  Result Date: 07/19/2017 CLINICAL DATA:  Cough EXAM: CHEST  2 VIEW COMPARISON:  07/07/2017 FINDINGS: Small bilateral pleural effusions. Right basilar atelectasis. Heart is normal size. No effusions or acute bony abnormality.  IMPRESSION: Small effusions.  Right base atelectasis. Electronically Signed   By: Rolm Baptise M.D.   On: 07/19/2017 12:00   Dg Chest 2 View  Result Date: 06/30/2017 CLINICAL DATA:  83 year old female with shortness of breath, vomiting and abdominal distention for 1 week. Peritoneal carcinomatosis diagnosed recently on CT. EXAM: CHEST  2 VIEW COMPARISON:  CT Abdomen and Pelvis 06/24/2017 and earlier. FINDINGS: Upright AP and lateral views of the chest. Small bilateral pleural effusions. No pneumothorax or pulmonary edema. No pulmonary nodule or consolidation. Normal cardiac size and mediastinal contours. Calcified aortic atherosclerosis. Osteopenia. No acute osseous abnormality identified. No pneumoperitoneum identified. Visible bowel gas pattern is stable from the recent CT. IMPRESSION: 1. Small pleural effusions probably not significantly changed from the recent CT 06/24/2017. No other acute cardiopulmonary abnormality. 2. Stable visible bowel gas pattern.  No abdominal free air. Electronically Signed   By: Genevie Ann M.D.   On: 06/30/2017 10:20   US Pelvis Transvanginal Non-ob (tv Only)  Result Date: 06/25/2017 CLINICAL DATA:  Ovarian mass on CT EXAM: TRANSABDOMINAL AND TRANSVAGINAL ULTRASOUND OF PELVIS TECHNIQUE: Both transabdominal and transvaginal ultrasound examinations of the pelvis were performed. Transabdominal technique was performed for global imaging of the pelvis including uterus, ovaries, adnexal regions, and pelvic cul-de-sac. It was necessary to proceed with endovaginal exam following the transabdominal exam to visualize the uterus, endometrium, ovaries and adnexa . COMPARISON:  CT 06/24/2017 FINDINGS: Uterus Measurements: 4.7 x 2.7 x 2.7 cm. No fibroids or other mass visualized. Endometrium Thickness: 2 mm in thickness.  No focal abnormality. Right ovary Measurements: 3.5 x 3.8 x 3.3 cm. 2.9 cm simple appearing cyst in the right ovary. Left ovary Measurements: 2.3 x 3.0 x 2.6 cm, difficult to  visualize. No visible focal mass. Other findings Large amount of ascites noted in the pelvis. IMPRESSION: 2.9 cm simple appearing cyst in the right ovary. Left ovary grossly unremarkable. Large volume ascites in the pelvis. Electronically Signed   By: Rolm Baptise M.D.   On: 06/25/2017 12:09   US Pelvis (transabdominal Only)  Result Date: 06/25/2017 CLINICAL DATA:  Ovarian mass on CT EXAM: TRANSABDOMINAL AND TRANSVAGINAL ULTRASOUND OF PELVIS TECHNIQUE: Both transabdominal and transvaginal ultrasound examinations of the pelvis were performed. Transabdominal technique was performed for global imaging of the pelvis including uterus, ovaries, adnexal regions, and pelvic cul-de-sac. It was necessary to proceed with endovaginal exam following the transabdominal exam to visualize the uterus, endometrium, ovaries and adnexa . COMPARISON:  CT 06/24/2017 FINDINGS: Uterus Measurements: 4.7 x 2.7 x 2.7 cm. No fibroids or other mass visualized. Endometrium Thickness: 2 mm in thickness.  No focal abnormality. Right ovary Measurements: 3.5 x 3.8 x 3.3 cm. 2.9 cm simple appearing cyst in the right ovary. Left ovary Measurements: 2.3 x 3.0 x 2.6 cm, difficult to visualize. No visible focal mass. Other findings Large amount of ascites noted in the pelvis. IMPRESSION: 2.9 cm simple appearing cyst in the right ovary. Left ovary grossly unremarkable. Large volume ascites in the pelvis. Electronically Signed   By: Rolm Baptise M.D.  On: 06/25/2017 12:09   Ct Abdomen Pelvis W Contrast  Result Date: 06/24/2017 CLINICAL DATA:  Abdominal pain with distention and nausea EXAM: CT ABDOMEN AND PELVIS WITH CONTRAST TECHNIQUE: Multidetector CT imaging of the abdomen and pelvis was performed using the standard protocol following bolus administration of intravenous contrast. CONTRAST:  117m ISOVUE-300 IOPAMIDOL (ISOVUE-300) INJECTION 61% COMPARISON:  02/07/2017 FINDINGS: Lower chest: Small right-sided pleural effusion. No acute  consolidation at the lung bases. Normal heart size. Hepatobiliary: No focal hepatic abnormality. Surgical absence of the gallbladder. No biliary dilatation. Pancreas: Unremarkable. No pancreatic ductal dilatation or surrounding inflammatory changes. Spleen: Stable 1.8 cm low-density lesion in the posterior spleen since 2017. Tiny hypodensity anterior spleen, also unchanged. Adrenals/Urinary Tract: Adrenal glands are within normal limits. Kidneys show no hydronephrosis. The bladder is nearly empty. Stomach/Bowel: Stomach is nonenlarged. No dilated small bowel. No colon wall thickening. Appendix is not well seen but no right lower quadrant inflammation. Vascular/Lymphatic: Aortic atherosclerosis. No aneurysmal dilatation. Enlarging upper abdominal/retrocaval lymph node, now measuring 14 mm. Multiple small periaortic lymph nodes, slight increase in size compared to prior. Reproductive: Uterus is atrophic. Re- demonstrated 3.8 cm right adnexal cyst. Increasing soft tissue density in the left at adnexa, now measuring 2.9 cm, with possible posterior cystic component measuring 19 mm. Other: Negative for free air. Development of moderate abdominal and pelvic ascites. Diffuse nodular infiltration of the omentum and anterior mesentery. Musculoskeletal: Degenerative changes of the spine, most notable at L4-L5 and L5-S1. Status post left femoral intramedullary rod. IMPRESSION: 1. Interim finding of moderate ascites within the abdomen and pelvis with additional finding of diffuse nodular infiltration of the omentum and anterior mesenteric fat, the appearance would be consistent with peritoneal carcinomatosis/metastatic disease. Increasing retroperitoneal and upper abdominal adenopathy. 2. Re- demonstrated 3.8 cm cyst in the right adnexa. Enlarging soft tissue density in the left adnexa now with possible cystic component posteriorly. In light of the above findings, concern is for ovarian neoplasm. Correlation with pelvic  ultrasound recommended. 3. Small right-sided pleural effusion, new since prior study 4. Stable hypodense splenic lesions since 2017. Electronically Signed   By: KDonavan FoilM.D.   On: 06/24/2017 22:00   [place]Abdomen Limited  Result Date: 07/19/2017 CLINICAL DATA:  83year old female with suspected ascites. Paracentesis is requested. EXAM: LIMITED ABDOMEN ULTRASOUND FOR ASCITES TECHNIQUE: Limited ultrasound survey for ascites was performed in all four abdominal quadrants. COMPARISON:  Paracentesis performed 07/08/2017 FINDINGS: Ultrasound interrogation of the abdomen demonstrates trace ascites. There is insufficient fluid for safe paracentesis at this time. IMPRESSION: Trace ascites.  Paracentesis was deferred. Electronically Signed   By: HJacqulynn CadetM.D.   On: 07/19/2017 13:33   [place]acentesis  Result Date: 07/08/2017 INDICATION: Patient with history of omental nodularity, abdominal pain, recurrent malignant ascites; request made for therapeutic paracentesis. EXAM: ULTRASOUND GUIDED THERAPEUTIC PARACENTESIS MEDICATIONS: None. COMPLICATIONS: None immediate. PROCEDURE: Informed written consent was obtained from the patient after a discussion of the risks, benefits and alternatives to treatment. A timeout was performed prior to the initiation of the procedure. Initial ultrasound scanning demonstrates a small to moderate amount of ascites within the right lower abdominal quadrant. The right lower abdomen was prepped and draped in the usual sterile fashion. 2% lidocaine was used for local anesthesia. Following this, a Yueh catheter was introduced. An ultrasound image was saved for documentation purposes. The paracentesis was performed. The catheter was removed and a dressing was applied. The patient tolerated the procedure well without immediate post procedural complication. FINDINGS: A total  of approximately 2.7 liters of turbid, light brown fluid was removed. IMPRESSION: Successful ultrasound-guided  therapeutic paracentesis yielding 2.7 liters liters of peritoneal fluid. Read by: Rowe Robert, PA-C Electronically Signed   By: Jerilynn Mages.  Shick M.D.   On: 07/08/2017 14:00   US Paracentesis  Result Date: 07/01/2017 INDICATION: Omental nodularity, abdominal pain, ascites, retroperitoneal adenopathy. Request made for diagnostic and therapeutic paracentesis. EXAM: ULTRASOUND GUIDED DIAGNOSTIC AND THERAPEUTIC PARACENTESIS MEDICATIONS: None. COMPLICATIONS: None immediate. PROCEDURE: Informed written consent was obtained from the patient after a discussion of the risks, benefits and alternatives to treatment. A timeout was performed prior to the initiation of the procedure. Initial ultrasound scanning demonstrates a small to moderate amount of ascites within the left lower abdominal quadrant. The left lower abdomen was prepped and draped in the usual sterile fashion. 2% lidocaine was used for local anesthesia. Following this, a Yueh catheter was introduced. An ultrasound image was saved for documentation purposes. The paracentesis was performed. The catheter was removed and a dressing was applied. The patient tolerated the procedure well without immediate post procedural complication. FINDINGS: A total of approximately 2.5 liters of turbid, light brown fluid was removed. Samples were sent to the laboratory as requested by the clinical team. IMPRESSION: Successful ultrasound-guided diagnostic and therapeutic paracentesis yielding 2.5 liters of peritoneal fluid. Read by: Rowe Robert, PA-C Electronically Signed   By: Markus Daft M.D.   On: 07/01/2017 11:49   Ir US Guide Vasc Access Right  Result Date: 07/22/2017 INDICATION: History of ovarian cancer, in need of durable intravenous access for chemotherapy administration. EXAM: IMPLANTED PORT A CATH PLACEMENT WITH ULTRASOUND AND FLUOROSCOPIC GUIDANCE COMPARISON:  None. MEDICATIONS: Ancef 2 gm IV; The antibiotic was administered within an appropriate time interval prior to  skin puncture. ANESTHESIA/SEDATION: Moderate (conscious) sedation was employed during this procedure. A total of Versed 2 mg and Fentanyl 100 mcg was administered intravenously. Moderate Sedation Time: 21 minutes. The patient's level of consciousness and vital signs were monitored continuously by radiology nursing throughout the procedure under my direct supervision. CONTRAST:  None FLUOROSCOPY TIME:  18 seconds (9 mGy) COMPLICATIONS: None immediate. PROCEDURE: The procedure, risks, benefits, and alternatives were explained to the patient. Questions regarding the procedure were encouraged and answered. The patient understands and consents to the procedure. The right neck and chest were prepped with chlorhexidine in a sterile fashion, and a sterile drape was applied covering the operative field. Maximum barrier sterile technique with sterile gowns and gloves were used for the procedure. A timeout was performed prior to the initiation of the procedure. Local anesthesia was provided with 1% lidocaine with epinephrine. After creating a small venotomy incision, a micropuncture kit was utilized to access the internal jugular vein. Real-time ultrasound guidance was utilized for vascular access including the acquisition of a permanent ultrasound image documenting patency of the accessed vessel. The microwire was utilized to measure appropriate catheter length. A subcutaneous port pocket was then created along the upper chest wall utilizing a combination of sharp and blunt dissection. The pocket was irrigated with sterile saline. A single lumen ISP power injectable port was chosen for placement. The 8 Fr catheter was tunneled from the port pocket site to the venotomy incision. The port was placed in the pocket. The external catheter was trimmed to appropriate length. At the venotomy, an 8 Fr peel-away sheath was placed over a guidewire under fluoroscopic guidance. The catheter was then placed through the sheath and the  sheath was removed. Final catheter positioning was confirmed and  documented with a fluoroscopic spot radiograph. The port was accessed with a Huber needle, aspirated and flushed. The venotomy site was closed with an interrupted 4-0 Vicryl suture. The port pocket incision was closed with interrupted 2-0 Vicryl suture and the skin was opposed with a running subcuticular 4-0 Vicryl suture. Dermabond and Steri-strips were applied to both incisions. The Port a Catheter was left accessed for in-hospital utilization. Dressings were placed. The patient tolerated the procedure well without immediate post procedural complication. FINDINGS: After catheter placement, the tip lies within the superior cavoatrial junction. The catheter aspirates and flushes normally and is ready for immediate use. IMPRESSION: Successful placement of a right internal jugular approach power injectable Port-A-Cath. The Port a Catheter was left accessed for potential immediate in-hospital utilization. Electronically Signed   By: Sandi Mariscal M.D.   On: 07/22/2017 14:05   Dg Chest Port 1 View  Result Date: 07/07/2017 CLINICAL DATA:  Shortness of breath. EXAM: PORTABLE CHEST 1 VIEW COMPARISON:  06/30/2017 FINDINGS: The cardiomediastinal silhouette is unchanged and within normal limits. Aortic atherosclerosis is noted. There are small bilateral pleural effusions which appear larger than on the prior study, although this could be due to differences in patient positioning. Patchy opacity is present in the lung bases. No pneumothorax is identified. No acute osseous abnormality is seen. IMPRESSION: Small pleural effusions, likely increased in size with patchy bibasilar atelectasis or pneumonia. Electronically Signed   By: Logan Bores M.D.   On: 07/07/2017 11:24   Ir Fluoro Guide Port Insertion Right  Result Date: 07/22/2017 INDICATION: History of ovarian cancer, in need of durable intravenous access for chemotherapy administration. EXAM: IMPLANTED  PORT A CATH PLACEMENT WITH ULTRASOUND AND FLUOROSCOPIC GUIDANCE COMPARISON:  None. MEDICATIONS: Ancef 2 gm IV; The antibiotic was administered within an appropriate time interval prior to skin puncture. ANESTHESIA/SEDATION: Moderate (conscious) sedation was employed during this procedure. A total of Versed 2 mg and Fentanyl 100 mcg was administered intravenously. Moderate Sedation Time: 21 minutes. The patient's level of consciousness and vital signs were monitored continuously by radiology nursing throughout the procedure under my direct supervision. CONTRAST:  None FLUOROSCOPY TIME:  18 seconds (9 mGy) COMPLICATIONS: None immediate. PROCEDURE: The procedure, risks, benefits, and alternatives were explained to the patient. Questions regarding the procedure were encouraged and answered. The patient understands and consents to the procedure. The right neck and chest were prepped with chlorhexidine in a sterile fashion, and a sterile drape was applied covering the operative field. Maximum barrier sterile technique with sterile gowns and gloves were used for the procedure. A timeout was performed prior to the initiation of the procedure. Local anesthesia was provided with 1% lidocaine with epinephrine. After creating a small venotomy incision, a micropuncture kit was utilized to access the internal jugular vein. Real-time ultrasound guidance was utilized for vascular access including the acquisition of a permanent ultrasound image documenting patency of the accessed vessel. The microwire was utilized to measure appropriate catheter length. A subcutaneous port pocket was then created along the upper chest wall utilizing a combination of sharp and blunt dissection. The pocket was irrigated with sterile saline. A single lumen ISP power injectable port was chosen for placement. The 8 Fr catheter was tunneled from the port pocket site to the venotomy incision. The port was placed in the pocket. The external catheter was  trimmed to appropriate length. At the venotomy, an 8 Fr peel-away sheath was placed over a guidewire under fluoroscopic guidance. The catheter was then placed through the sheath  and the sheath was removed. Final catheter positioning was confirmed and documented with a fluoroscopic spot radiograph. The port was accessed with a Huber needle, aspirated and flushed. The venotomy site was closed with an interrupted 4-0 Vicryl suture. The port pocket incision was closed with interrupted 2-0 Vicryl suture and the skin was opposed with a running subcuticular 4-0 Vicryl suture. Dermabond and Steri-strips were applied to both incisions. The Port a Catheter was left accessed for in-hospital utilization. Dressings were placed. The patient tolerated the procedure well without immediate post procedural complication. FINDINGS: After catheter placement, the tip lies within the superior cavoatrial junction. The catheter aspirates and flushes normally and is ready for immediate use. IMPRESSION: Successful placement of a right internal jugular approach power injectable Port-A-Cath. The Port a Catheter was left accessed for potential immediate in-hospital utilization. Electronically Signed   By: Sandi Mariscal M.D.   On: 07/22/2017 14:05   Ir Paracentesis  Result Date: 07/12/2017 INDICATION: Recurrent ascites suspicious for carcinomatosis. Request therapeutic paracentesis EXAM: ULTRASOUND GUIDED RIGHT LOWER QUADRANT PARACENTESIS MEDICATIONS: None. COMPLICATIONS: None immediate. PROCEDURE: Informed written consent was obtained from the patient after a discussion of the risks, benefits and alternatives to treatment. A timeout was performed prior to the initiation of the procedure. Initial ultrasound scanning demonstrates a large amount of ascites within the right lower abdominal quadrant. The right lower abdomen was prepped and draped in the usual sterile fashion. 1% lidocaine with epinephrine was used for local anesthesia. Following  this, a 19 gauge, 7-cm, Yueh catheter was introduced. An ultrasound image was saved for documentation purposes. The paracentesis was performed. The catheter was removed and a dressing was applied. The patient tolerated the procedure well without immediate post procedural complication. FINDINGS: A total of approximately 1450 mL of cloudy yellow fluid was removed. IMPRESSION: Successful ultrasound-guided paracentesis yielding 1450 mL of peritoneal fluid. Read by: Ascencion Dike PA-C Electronically Signed   By: Jerilynn Mages.  Shick M.D.   On: 07/12/2017 10:41    Discharge Laboratory Values: Lab Results  Component Value Date   WBC 25.3 (H) 07/23/2017   HGB 13.7 07/23/2017   HCT 41.7 07/23/2017   MCV 88.7 07/23/2017   PLT 179 07/23/2017   Lab Results  Component Value Date   NA 134 (L) 07/23/2017   K 4.5 07/23/2017   CL 99 (L) 07/23/2017   CO2 29 07/23/2017    Brief H and P: For complete details please refer to admission H and P, but in brief, the patient was admitted to the hospital due to profound weakness from untreated cancer.  Locally advanced ovarian cancer The patient has locally advanced ovarian cancer She is recommended neoadjuvant chemotherapy due to her general debility She had received her first dose of carboplatin and Taxol on July 19, 2017.  Acute on chronic COPD exacerbation She has mild respiratory failure due to progressive abdominal distention causing difficulty takingin deep breaths She will continue oxygen therapy and inhalers Chest x-ray show no evidence of pneumonia but evidence of small pleural effusion. Continue conservative management and incentive spirometry  Malignant ascites Ultrasound did not detect significant fluid for paracentesis on July 19, 2017. Hopefully, with chemotherapy, malignant ascites will not recur  Chronic atrial fibrillation Resume Xarelto  Chronic nausea, resolved This is secondary to untreated cancer She will continue antiemetics as  needed  Severe constipation, resolved It resolved on MiraLAX and Senokot She will be discharged on Senokot daily  Generalized weakness and debility This is due to untreated cancer Physical therapy was  consulted  Poor social circumstances She lives alone The patient is at high risk of fall, infection and risk of readmission to the hospital She has very poor social circumstances I recommend social worker consult to look for skilled nursing facility I think she would benefit from active rehabilitation and skilled nursing facility over the next 3 months until she completes neoadjuvant chemotherapy and surgery if possible  CODE STATUS We discussed goals of care We discussed CODE STATUS The patient desire full code I recommend consulting social worker for advanced directive documentation She appointed her daughter, [name] as her healthcare power of attorney  Discharge planning Awaiting skilled nursing facility placement Physical Exam at Discharge: BP 115/79 (BP Location: Left Arm)   Pulse 82   Temp 97.9 F (36.6 C) (Oral)   Resp 17   Ht 5' 4"  (1.626 m)   Wt 178 lb (80.7 kg)   SpO2 96%   BMI 30.55 kg/m  GENERAL:alert, no distress and comfortable SKIN: skin color, texture, turgor are normal, no rashes or significant lesions EYES: normal, Conjunctiva are pink and non-injected, sclera clear OROPHARYNX:no exudate, no erythema and lips, buccal mucosa, and tongue normal  NECK: supple, thyroid normal size, non-tender, without nodularity LYMPH:  no palpable lymphadenopathy in the cervical, axillary or inguinal LUNGS: clear to auscultation and percussion with normal breathing effort HEART: regular rate & rhythm and no murmurs and no lower extremity edema ABDOMEN:abdomen soft, non-tender and normal bowel sounds Musculoskeletal:no cyanosis of digits and no clubbing  NEURO: alert & oriented x 3 with fluent speech, no focal motor/sensory deficits  Hospital Course:  Active  Problems:   Ovarian CA, right (HCC)   COPD (chronic obstructive pulmonary disease) (HCC)   Diarrhea   ABDOMINAL PAIN -GENERALIZED   Bloating   Upper abdominal pain   Muscle weakness (generalized)   Atrial fibrillation (HCC)   Ascites, malignant   Antineoplastic chemotherapy induced pancytopenia (HCC)   Diet:  Regular  Activity:  As tolerated  Condition at Discharge:   stable Spent 35 minutes on discharge Signed: Dr. Heath Lark [phone]  07/24/2017, 8:04 AM

## 2017-07-24 NOTE — Progress Notes (Signed)
Pt has had loose watery stool this am. Prn given. I spoke to Tammy Dr Calton Dach nurse and gave her this message. This makes 8 stiils in 24 hrs

## 2017-07-24 NOTE — Progress Notes (Signed)
Care of the steri strips at the [facility] site were discussed with Ms Hitchcock. She is being  d/c"d to a SNF . Her daughter is taking her. She verbalized understanding of the d/c summary. The AVS was not printed because the printers were not working.Ms Ziemann verbalized her appreciation of the "great" care she received here.

## 2017-07-24 NOTE — Progress Notes (Addendum)
CSW following to assist with transition to SNF at [place]. Was set to go to [facility] today at [place] however [facility] received word from [facility] this morning that their DC did not happen as scheduled therefore there are no beds until Friday 07/26/17. Informed pt and her daughter. Daughter en route to hospital, once arrived will deliberate over other SNF bed offers. Wants to make visits prior to pt choosing.  CSW reached out to the facilities which made bed offers to inquire as to bed availability today and will update daughter as responses are received.  Sharren Bridge, MSW, LCSW Clinical Social Work 07/24/2017 [phone]  Pt/daughter choose [facility] for SNF.  Confirmed private room available at facility. Provided all information via the [facility]. Facility requested daughter complete admission paperwork 2pm. Informed daughter- she is also transporting her mother via private vehicle. CSW left voicemail with admissions to inquire if both transport and paperwork could be completed at 2pm.   Report # will be [phone], bed number to be assigned this afternoon.  Will continue assisting with transition to [facility] at [place] this afternoon.

## 2017-07-26 ENCOUNTER — Telehealth: Payer: Self-pay | Admitting: Hematology and Oncology

## 2017-07-26 NOTE — Telephone Encounter (Signed)
Scheduled appt per 10/17 sch message - left message with apt date and time and sent reminder letter in the mail.

## 2017-07-30 ENCOUNTER — Other Ambulatory Visit: Payer: Self-pay

## 2017-07-30 ENCOUNTER — Encounter: Payer: Self-pay | Admitting: *Deleted

## 2017-07-30 ENCOUNTER — Ambulatory Visit: Payer: Medicare Other

## 2017-07-30 ENCOUNTER — Other Ambulatory Visit (HOSPITAL_BASED_OUTPATIENT_CLINIC_OR_DEPARTMENT_OTHER): Payer: Medicare Other

## 2017-07-30 ENCOUNTER — Ambulatory Visit: Payer: Self-pay | Admitting: Hematology and Oncology

## 2017-07-30 ENCOUNTER — Encounter: Payer: Self-pay | Admitting: Hematology and Oncology

## 2017-07-30 ENCOUNTER — Ambulatory Visit (HOSPITAL_BASED_OUTPATIENT_CLINIC_OR_DEPARTMENT_OTHER): Payer: Medicare Other | Admitting: Hematology and Oncology

## 2017-07-30 ENCOUNTER — Ambulatory Visit: Payer: Self-pay

## 2017-07-30 ENCOUNTER — Other Ambulatory Visit: Payer: Medicare Other

## 2017-07-30 VITALS — BP 117/59 | HR 93 | Temp 98.0°F | Resp 18 | Ht 64.0 in | Wt 180.0 lb

## 2017-07-30 DIAGNOSIS — C569 Malignant neoplasm of unspecified ovary: Secondary | ICD-10-CM

## 2017-07-30 DIAGNOSIS — R609 Edema, unspecified: Secondary | ICD-10-CM

## 2017-07-30 DIAGNOSIS — J449 Chronic obstructive pulmonary disease, unspecified: Secondary | ICD-10-CM

## 2017-07-30 DIAGNOSIS — R600 Localized edema: Secondary | ICD-10-CM

## 2017-07-30 DIAGNOSIS — I4891 Unspecified atrial fibrillation: Secondary | ICD-10-CM | POA: Diagnosis not present

## 2017-07-30 DIAGNOSIS — J438 Other emphysema: Secondary | ICD-10-CM

## 2017-07-30 DIAGNOSIS — R531 Weakness: Secondary | ICD-10-CM

## 2017-07-30 DIAGNOSIS — C561 Malignant neoplasm of right ovary: Secondary | ICD-10-CM

## 2017-07-30 DIAGNOSIS — R180 Malignant ascites: Secondary | ICD-10-CM

## 2017-07-30 DIAGNOSIS — I482 Chronic atrial fibrillation, unspecified: Secondary | ICD-10-CM

## 2017-07-30 DIAGNOSIS — E440 Moderate protein-calorie malnutrition: Secondary | ICD-10-CM | POA: Diagnosis not present

## 2017-07-30 LAB — CBC WITH DIFFERENTIAL/PLATELET
BASO%: 0.5 % (ref 0.0–2.0)
BASOS ABS: 0.1 10*3/uL (ref 0.0–0.1)
EOS ABS: 0.1 10*3/uL (ref 0.0–0.5)
EOS%: 0.7 % (ref 0.0–7.0)
HCT: 41.2 % (ref 34.8–46.6)
HEMOGLOBIN: 13.2 g/dL (ref 11.6–15.9)
LYMPH%: 8 % — AB (ref 14.0–49.7)
MCH: 28.3 pg (ref 25.1–34.0)
MCHC: 32 g/dL (ref 31.5–36.0)
MCV: 88.4 fL (ref 79.5–101.0)
MONO#: 0.9 10*3/uL (ref 0.1–0.9)
MONO%: 8.1 % (ref 0.0–14.0)
NEUT#: 8.8 10*3/uL — ABNORMAL HIGH (ref 1.5–6.5)
NEUT%: 82.7 % — AB (ref 38.4–76.8)
Platelets: 221 10*3/uL (ref 145–400)
RBC: 4.66 10*6/uL (ref 3.70–5.45)
RDW: 15.9 % — AB (ref 11.2–14.5)
WBC: 10.7 10*3/uL — ABNORMAL HIGH (ref 3.9–10.3)
lymph#: 0.9 10*3/uL (ref 0.9–3.3)

## 2017-07-30 LAB — COMPREHENSIVE METABOLIC PANEL
ALBUMIN: 2.4 g/dL — AB (ref 3.5–5.0)
ALK PHOS: 60 U/L (ref 40–150)
ALT: 31 U/L (ref 0–55)
AST: 32 U/L (ref 5–34)
Anion Gap: 7 mEq/L (ref 3–11)
BUN: 6.8 mg/dL — ABNORMAL LOW (ref 7.0–26.0)
CHLORIDE: 99 meq/L (ref 98–109)
CO2: 27 mEq/L (ref 22–29)
Calcium: 7.8 mg/dL — ABNORMAL LOW (ref 8.4–10.4)
Creatinine: 0.7 mg/dL (ref 0.6–1.1)
GLUCOSE: 91 mg/dL (ref 70–140)
POTASSIUM: 4.3 meq/L (ref 3.5–5.1)
SODIUM: 134 meq/L — AB (ref 136–145)
Total Bilirubin: 0.31 mg/dL (ref 0.20–1.20)
Total Protein: 5.6 g/dL — ABNORMAL LOW (ref 6.4–8.3)

## 2017-07-30 MED ORDER — LIDOCAINE-PRILOCAINE 2.5-2.5 % EX CREA: 1.0000 "application " | TOPICAL_CREAM | CUTANEOUS | 6 refills | Status: DC | PRN

## 2017-07-30 MED ORDER — DEXAMETHASONE 4 MG PO TABS: ORAL_TABLET | ORAL | 1 refills | Status: DC

## 2017-07-30 NOTE — Assessment & Plan Note (Signed)
She has active COPD with dyspnea at rest We discussed conservative management with oxygen, inhalers and incentive spirometry I recommend consideration for CXR today but she declined

## 2017-07-30 NOTE — Assessment & Plan Note (Signed)
She has low protein status Likely due to eating habits and active cancer I encourage her to eat and add nutritional supplements as tolerated

## 2017-07-30 NOTE — Progress Notes (Signed)
[facility] OFFICE PROGRESS NOTE  Patient Care Team: Asencion Noble, MD as PCP - General (Internal Medicine)  SUMMARY OF ONCOLOGIC HISTORY:   Ovarian CA, right ([place])   02/18/2016 Tumor Marker    Patient's tumor was tested for the following markers: CA125 Results of the tumor marker test revealed 45      05/22/2016 Tumor Marker    Patient's tumor was tested for the following markers: CA125 Results of the tumor marker test revealed 53      05/22/2016 Imaging    Outside pelvic US showed 4.1 cm adnexa mass      06/24/2017 Imaging    Ct abdomen and pelvis:  1. Interim finding of moderate ascites within the abdomen and pelvis with additional finding of diffuse nodular infiltration of the omentum and anterior mesenteric fat, the appearance would be consistent with peritoneal carcinomatosis/metastatic disease. Increasing retroperitoneal and upper abdominal adenopathy. 2. Re- demonstrated 3.8 cm cyst in the right adnexa. Enlarging soft tissue density in the left adnexa now with possible cystic component posteriorly. In light of the above findings, concern is for ovarian neoplasm. Correlation with pelvic ultrasound recommended. 3. Small right-sided pleural effusion, new since prior study 4. Stable hypodense splenic lesions since 2017.       06/25/2017 Imaging    US pelvis: 2.9 cm simple appearing cyst in the right ovary. Left ovary grossly unremarkable. Large volume ascites in the pelvis      06/30/2017 - 07/01/2017 Hospital Admission    She was admitted for evaluation of abdominal pain and ascites      07/01/2017 Pathology Results    PERITONEAL/ASCITIC FLUID(SPECIMEN 1 OF 1 COLLECTED 07/01/17): - POORLY DIFFERENTIATED CARCINOMA; SEE COMMENT Source Peritoneal/Ascitic Fluid, (specimen 1 of 1 collected 07/01/17) Gross Specimen: Received is/are 1000 cc's of brownish fluid. (BS:bs) Prepared: # Smears: 0 # Concentration Technique Slides (i.e. ThinPrep): 1 # Cell Block:  1 Additional Studies: Also received Hematology slide - [id number]. Comment The tumor cells are positive for cytokeratin 7 and Pax-8 but negative for cytokeratin 20, CDX-2, GATA-3, Napsin-A and TTF-1. Based on the immunoprofile a gynecology primary is favored      07/01/2017 Procedure    Successful ultrasound-guided diagnostic and therapeutic paracentesis yielding 2.5 liters of peritoneal fluid      07/07/2017 - 07/09/2017 Hospital Admission    She was admitted for management of malignant ascites      07/08/2017 Procedure    Successful ultrasound-guided therapeutic paracentesis yielding 2.7 liters liters of peritoneal fluid      07/12/2017 Procedure    Successful ultrasound-guided paracentesis yielding 1450 mL of peritoneal fluid      07/18/2017 - 07/24/2017 Hospital Admission    She was admitted for expedited treatment      07/18/2017 Tumor Marker    Patient's tumor was tested for the following markers: CA125 Results of the tumor marker test revealed 1941      07/19/2017 -  Chemotherapy    The patient had palonosetron (ALOXI) injection 0.25 mg, 0.25 mg, Intravenous,  Once, 1 of 6 cycles  pegfilgrastim (NEULASTA) injection 6 mg, 6 mg, Subcutaneous,  Once, 1 of 6 cycles  CARBOplatin (PARAPLATIN) 490 mg in sodium chloride 0.9 % 250 mL chemo infusion, 490 mg (100 % of original dose 487.2 mg), Intravenous,  Once, 1 of 6 cycles Dose modification:   (original dose 487.2 mg, Cycle 1)  PACLitaxel (TAXOL) 336 mg in dextrose 5 % 500 mL chemo infusion (> 14m/m2), 175 mg/m2 = 336 mg, Intravenous,  Once, 1 of 6 cycles  fosaprepitant (EMEND) 150 mg, dexamethasone (DECADRON) 12 mg in sodium chloride 0.9 % 145 mL IVPB, , Intravenous,  Once, 1 of 6 cycles  for chemotherapy treatment.         INTERVAL HISTORY: Please see below for problem oriented charting. She returns for follow-up She is slowly improving  Appetite remained poor She denies nausea or vomiting She has mild loose BM due to  her chronic colitis The patient denies any recent signs or symptoms of bleeding such as spontaneous epistaxis, hematuria or hematochezia. She has mild dyspnea on exertion and mild cough No neuropathy She noticed mild leg swelling  REVIEW OF SYSTEMS:   Constitutional: Denies fevers, chills or abnormal weight loss Eyes: Denies blurriness of vision Ears, nose, mouth, throat, and face: Denies mucositis or sore throat Cardiovascular: Denies palpitation, chest discomfort  Skin: Denies abnormal skin rashes Lymphatics: Denies new lymphadenopathy or easy bruising Neurological:Denies numbness, tingling or new weaknesses Behavioral/Psych: Mood is stable, no new changes  All other systems were reviewed with the patient and are negative.  I have reviewed the past medical history, past surgical history, social history and family history with the patient and they are unchanged from previous note.  ALLERGIES:  is allergic to codeine.  MEDICATIONS:  Current Outpatient Prescriptions  Medication Sig Dispense Refill  . albuterol (PROAIR HFA) 108 (90 Base) MCG/ACT inhaler Inhale 2 puffs into the lungs every 6 (six) hours as needed for wheezing or shortness of breath. 1 Inhaler 3  . ALPRAZolam (XANAX) 0.25 MG tablet Take 1 tablet (0.25 mg total) by mouth at bedtime as needed for anxiety. 30 tablet 0  . azelastine (ASTELIN) 0.1 % nasal spray Place 2 sprays into both nostrils 2 (two) times daily. Use in each nostril as directed    . budesonide-formoterol (SYMBICORT) 160-4.5 MCG/ACT inhaler Inhale 2 puffs into the lungs 2 (two) times daily. 1 Inhaler 3  . Cholecalciferol (VITAMIN D) 2000 units CAPS Take 2,000 Units by mouth daily.    Marland Kitchen dexamethasone (DECADRON) 4 MG tablet Take 5 tablets at 9 pm the day before chemo and to take 5 tabs at 6 am the day of chemo, every 3 weeks 30 tablet 1  . diltiazem (CARTIA XT) 120 MG 24 hr capsule TAKE 1 CAPSULE BY MOUTH EVERY DAY 90 capsule 3  . diphenoxylate-atropine  (LOMOTIL) 2.5-0.025 MG tablet Take 1 tablet by mouth 4 (four) times daily. (Patient taking differently: Take 1 tablet by mouth 4 (four) times daily as needed for diarrhea or loose stools. ) 360 tablet 1  . fluticasone (FLONASE) 50 MCG/ACT nasal spray Place 2 sprays into both nostrils 2 (two) times daily. For nasal congestion    . guaiFENesin-dextromethorphan (ROBITUSSIN DM) 100-10 MG/5ML syrup Take 5 mLs by mouth every 4 (four) hours as needed for cough. 118 mL 0  . levothyroxine (SYNTHROID, LEVOTHROID) 137 MCG tablet Take 137 mcg by mouth every morning. For thyroid therapy    . lidocaine-prilocaine (EMLA) cream Apply 1 application topically as needed. 30 g 6  . loperamide (IMODIUM) 2 MG capsule Take 2 capsules (4 mg total) by mouth 3 (three) times daily. 30 capsule 0  . loratadine (CLARITIN) 10 MG tablet Take 1 tablet (10 mg total) by mouth daily. 30 tablet 0  . MAGNESIUM GLYCINATE PLUS PO Take 400 mg by mouth daily.    . methylcellulose (ARTIFICIAL TEARS) 1 % ophthalmic solution Place 1 drop into both eyes 2 (two) times daily as needed. Dry eyes    .  prochlorperazine (COMPAZINE) 10 MG tablet Take 10 mg by mouth every 6 (six) hours as needed for nausea.  1  . senna-docusate (SENOKOT-S) 8.6-50 MG tablet Take 1 tablet by mouth at bedtime. 30 tablet 0  . XARELTO 20 MG TABS tablet TAKE 1 TABLET BY MOUTH EVERY DAY WITH SUPPER 90 tablet 0   No current facility-administered medications for this visit.     PHYSICAL EXAMINATION: ECOG PERFORMANCE STATUS: 2 - Symptomatic, <50% confined to bed  Vitals:   07/30/17 1325  BP: (!) 117/59  Pulse: 93  Resp: 18  Temp: 98 F (36.7 C)  SpO2: 95%   Filed Weights   07/30/17 1325  Weight: 180 lb (81.6 kg)    GENERAL:alert, no distress and comfortable SKIN: skin color, texture, turgor are normal, no rashes or significant lesions EYES: normal, Conjunctiva are pink and non-injected, sclera clear OROPHARYNX:no exudate, no erythema and lips, buccal mucosa,  and tongue normal  NECK: supple, thyroid normal size, non-tender, without nodularity LYMPH:  no palpable lymphadenopathy in the cervical, axillary or inguinal LUNGS: mild crackles on both lung bases with normal breathing effort HEART: regular rate & rhythm and no murmurs with mild bilateral lower extremity edema ABDOMEN:abdomen soft, non-tender and normal bowel sounds Musculoskeletal:no cyanosis of digits and no clubbing  NEURO: alert & oriented x 3 with fluent speech, no focal motor/sensory deficits  LABORATORY DATA:  I have reviewed the data as listed    Component Value Date/Time   NA 134 (L) 07/30/2017 1113   K 4.3 07/30/2017 1113   CL 99 (L) 07/23/2017 0536   CO2 27 07/30/2017 1113   GLUCOSE 91 07/30/2017 1113   BUN 6.8 (L) 07/30/2017 1113   CREATININE 0.7 07/30/2017 1113   CALCIUM 7.8 (L) 07/30/2017 1113   PROT 5.6 (L) 07/30/2017 1113   ALBUMIN 2.4 (L) 07/30/2017 1113   AST 32 07/30/2017 1113   ALT 31 07/30/2017 1113   ALKPHOS 60 07/30/2017 1113   BILITOT 0.31 07/30/2017 1113   GFRNONAA >60 07/23/2017 0536   GFRAA >60 07/23/2017 0536    No results found for: SPEP, UPEP  Lab Results  Component Value Date   WBC 10.7 (H) 07/30/2017   NEUTROABS 8.8 (H) 07/30/2017   HGB 13.2 07/30/2017   HCT 41.2 07/30/2017   MCV 88.4 07/30/2017   PLT 221 07/30/2017      Chemistry      Component Value Date/Time   NA 134 (L) 07/30/2017 1113   K 4.3 07/30/2017 1113   CL 99 (L) 07/23/2017 0536   CO2 27 07/30/2017 1113   BUN 6.8 (L) 07/30/2017 1113   CREATININE 0.7 07/30/2017 1113      Component Value Date/Time   CALCIUM 7.8 (L) 07/30/2017 1113   ALKPHOS 60 07/30/2017 1113   AST 32 07/30/2017 1113   ALT 31 07/30/2017 1113   BILITOT 0.31 07/30/2017 1113       RADIOGRAPHIC STUDIES: I have personally reviewed the radiological images as listed and agreed with the findings in the report. Dg Chest 2 View  Result Date: 07/21/2017 CLINICAL DATA:  Productive cough. History of  ovarian carcinoma with current chemotherapy. COPD exacerbation. EXAM: CHEST  2 VIEW COMPARISON:  07/19/2017 FINDINGS: The heart size and mediastinal contours are within normal limits. Increased opacity at the right lung base likely mostly relates to pleural fluid. However, there may be a component of pneumonia present. Small left pleural effusion present. The visualized skeletal structures are unremarkable. IMPRESSION: Increased opacity at the right lung  base since the prior radiograph 2 days ago. This is felt to mostly relate to a moderate pleural effusion. However, component of pneumonia may be present. A small left pleural effusion remains present. Electronically Signed   By: Aletta Edouard M.D.   On: 07/21/2017 12:28   Dg Chest 2 View  Result Date: 07/19/2017 CLINICAL DATA:  Cough EXAM: CHEST  2 VIEW COMPARISON:  07/07/2017 FINDINGS: Small bilateral pleural effusions. Right basilar atelectasis. Heart is normal size. No effusions or acute bony abnormality. IMPRESSION: Small effusions.  Right base atelectasis. Electronically Signed   By: Rolm Baptise M.D.   On: 07/19/2017 12:00   US Abdomen Limited  Result Date: 07/19/2017 CLINICAL DATA:  83 year old female with suspected ascites. Paracentesis is requested. EXAM: LIMITED ABDOMEN ULTRASOUND FOR ASCITES TECHNIQUE: Limited ultrasound survey for ascites was performed in all four abdominal quadrants. COMPARISON:  Paracentesis performed 07/08/2017 FINDINGS: Ultrasound interrogation of the abdomen demonstrates trace ascites. There is insufficient fluid for safe paracentesis at this time. IMPRESSION: Trace ascites.  Paracentesis was deferred. Electronically Signed   By: Jacqulynn Cadet M.D.   On: 07/19/2017 13:33   US Paracentesis  Result Date: 07/08/2017 INDICATION: Patient with history of omental nodularity, abdominal pain, recurrent malignant ascites; request made for therapeutic paracentesis. EXAM: ULTRASOUND GUIDED THERAPEUTIC PARACENTESIS  MEDICATIONS: None. COMPLICATIONS: None immediate. PROCEDURE: Informed written consent was obtained from the patient after a discussion of the risks, benefits and alternatives to treatment. A timeout was performed prior to the initiation of the procedure. Initial ultrasound scanning demonstrates a small to moderate amount of ascites within the right lower abdominal quadrant. The right lower abdomen was prepped and draped in the usual sterile fashion. 2% lidocaine was used for local anesthesia. Following this, a Yueh catheter was introduced. An ultrasound image was saved for documentation purposes. The paracentesis was performed. The catheter was removed and a dressing was applied. The patient tolerated the procedure well without immediate post procedural complication. FINDINGS: A total of approximately 2.7 liters of turbid, light brown fluid was removed. IMPRESSION: Successful ultrasound-guided therapeutic paracentesis yielding 2.7 liters liters of peritoneal fluid. Read by: Rowe Robert, PA-C Electronically Signed   By: Jerilynn Mages.  Shick M.D.   On: 07/08/2017 14:00   US Paracentesis  Result Date: 07/01/2017 INDICATION: Omental nodularity, abdominal pain, ascites, retroperitoneal adenopathy. Request made for diagnostic and therapeutic paracentesis. EXAM: ULTRASOUND GUIDED DIAGNOSTIC AND THERAPEUTIC PARACENTESIS MEDICATIONS: None. COMPLICATIONS: None immediate. PROCEDURE: Informed written consent was obtained from the patient after a discussion of the risks, benefits and alternatives to treatment. A timeout was performed prior to the initiation of the procedure. Initial ultrasound scanning demonstrates a small to moderate amount of ascites within the left lower abdominal quadrant. The left lower abdomen was prepped and draped in the usual sterile fashion. 2% lidocaine was used for local anesthesia. Following this, a Yueh catheter was introduced. An ultrasound image was saved for documentation purposes. The paracentesis  was performed. The catheter was removed and a dressing was applied. The patient tolerated the procedure well without immediate post procedural complication. FINDINGS: A total of approximately 2.5 liters of turbid, light brown fluid was removed. Samples were sent to the laboratory as requested by the clinical team. IMPRESSION: Successful ultrasound-guided diagnostic and therapeutic paracentesis yielding 2.5 liters of peritoneal fluid. Read by: Rowe Robert, PA-C Electronically Signed   By: Markus Daft M.D.   On: 07/01/2017 11:49   Ir US Guide Vasc Access Right  Result Date: 07/22/2017 INDICATION: History of ovarian cancer, in need  of durable intravenous access for chemotherapy administration. EXAM: IMPLANTED PORT A CATH PLACEMENT WITH ULTRASOUND AND FLUOROSCOPIC GUIDANCE COMPARISON:  None. MEDICATIONS: Ancef 2 gm IV; The antibiotic was administered within an appropriate time interval prior to skin puncture. ANESTHESIA/SEDATION: Moderate (conscious) sedation was employed during this procedure. A total of Versed 2 mg and Fentanyl 100 mcg was administered intravenously. Moderate Sedation Time: 21 minutes. The patient's level of consciousness and vital signs were monitored continuously by radiology nursing throughout the procedure under my direct supervision. CONTRAST:  None FLUOROSCOPY TIME:  18 seconds (9 mGy) COMPLICATIONS: None immediate. PROCEDURE: The procedure, risks, benefits, and alternatives were explained to the patient. Questions regarding the procedure were encouraged and answered. The patient understands and consents to the procedure. The right neck and chest were prepped with chlorhexidine in a sterile fashion, and a sterile drape was applied covering the operative field. Maximum barrier sterile technique with sterile gowns and gloves were used for the procedure. A timeout was performed prior to the initiation of the procedure. Local anesthesia was provided with 1% lidocaine with epinephrine. After  creating a small venotomy incision, a micropuncture kit was utilized to access the internal jugular vein. Real-time ultrasound guidance was utilized for vascular access including the acquisition of a permanent ultrasound image documenting patency of the accessed vessel. The microwire was utilized to measure appropriate catheter length. A subcutaneous port pocket was then created along the upper chest wall utilizing a combination of sharp and blunt dissection. The pocket was irrigated with sterile saline. A single lumen ISP power injectable port was chosen for placement. The 8 Fr catheter was tunneled from the port pocket site to the venotomy incision. The port was placed in the pocket. The external catheter was trimmed to appropriate length. At the venotomy, an 8 Fr peel-away sheath was placed over a guidewire under fluoroscopic guidance. The catheter was then placed through the sheath and the sheath was removed. Final catheter positioning was confirmed and documented with a fluoroscopic spot radiograph. The port was accessed with a Huber needle, aspirated and flushed. The venotomy site was closed with an interrupted 4-0 Vicryl suture. The port pocket incision was closed with interrupted 2-0 Vicryl suture and the skin was opposed with a running subcuticular 4-0 Vicryl suture. Dermabond and Steri-strips were applied to both incisions. The Port a Catheter was left accessed for in-hospital utilization. Dressings were placed. The patient tolerated the procedure well without immediate post procedural complication. FINDINGS: After catheter placement, the tip lies within the superior cavoatrial junction. The catheter aspirates and flushes normally and is ready for immediate use. IMPRESSION: Successful placement of a right internal jugular approach power injectable Port-A-Cath. The Port a Catheter was left accessed for potential immediate in-hospital utilization. Electronically Signed   By: Sandi Mariscal M.D.   On: 07/22/2017  14:05   Dg Chest Port 1 View  Result Date: 07/07/2017 CLINICAL DATA:  Shortness of breath. EXAM: PORTABLE CHEST 1 VIEW COMPARISON:  06/30/2017 FINDINGS: The cardiomediastinal silhouette is unchanged and within normal limits. Aortic atherosclerosis is noted. There are small bilateral pleural effusions which appear larger than on the prior study, although this could be due to differences in patient positioning. Patchy opacity is present in the lung bases. No pneumothorax is identified. No acute osseous abnormality is seen. IMPRESSION: Small pleural effusions, likely increased in size with patchy bibasilar atelectasis or pneumonia. Electronically Signed   By: Logan Bores M.D.   On: 07/07/2017 11:24   Ir Fluoro Guide Port Insertion Right  Result Date: 07/22/2017 INDICATION: History of ovarian cancer, in need of durable intravenous access for chemotherapy administration. EXAM: IMPLANTED PORT A CATH PLACEMENT WITH ULTRASOUND AND FLUOROSCOPIC GUIDANCE COMPARISON:  None. MEDICATIONS: Ancef 2 gm IV; The antibiotic was administered within an appropriate time interval prior to skin puncture. ANESTHESIA/SEDATION: Moderate (conscious) sedation was employed during this procedure. A total of Versed 2 mg and Fentanyl 100 mcg was administered intravenously. Moderate Sedation Time: 21 minutes. The patient's level of consciousness and vital signs were monitored continuously by radiology nursing throughout the procedure under my direct supervision. CONTRAST:  None FLUOROSCOPY TIME:  18 seconds (9 mGy) COMPLICATIONS: None immediate. PROCEDURE: The procedure, risks, benefits, and alternatives were explained to the patient. Questions regarding the procedure were encouraged and answered. The patient understands and consents to the procedure. The right neck and chest were prepped with chlorhexidine in a sterile fashion, and a sterile drape was applied covering the operative field. Maximum barrier sterile technique with sterile  gowns and gloves were used for the procedure. A timeout was performed prior to the initiation of the procedure. Local anesthesia was provided with 1% lidocaine with epinephrine. After creating a small venotomy incision, a micropuncture kit was utilized to access the internal jugular vein. Real-time ultrasound guidance was utilized for vascular access including the acquisition of a permanent ultrasound image documenting patency of the accessed vessel. The microwire was utilized to measure appropriate catheter length. A subcutaneous port pocket was then created along the upper chest wall utilizing a combination of sharp and blunt dissection. The pocket was irrigated with sterile saline. A single lumen ISP power injectable port was chosen for placement. The 8 Fr catheter was tunneled from the port pocket site to the venotomy incision. The port was placed in the pocket. The external catheter was trimmed to appropriate length. At the venotomy, an 8 Fr peel-away sheath was placed over a guidewire under fluoroscopic guidance. The catheter was then placed through the sheath and the sheath was removed. Final catheter positioning was confirmed and documented with a fluoroscopic spot radiograph. The port was accessed with a Huber needle, aspirated and flushed. The venotomy site was closed with an interrupted 4-0 Vicryl suture. The port pocket incision was closed with interrupted 2-0 Vicryl suture and the skin was opposed with a running subcuticular 4-0 Vicryl suture. Dermabond and Steri-strips were applied to both incisions. The Port a Catheter was left accessed for in-hospital utilization. Dressings were placed. The patient tolerated the procedure well without immediate post procedural complication. FINDINGS: After catheter placement, the tip lies within the superior cavoatrial junction. The catheter aspirates and flushes normally and is ready for immediate use. IMPRESSION: Successful placement of a right internal jugular  approach power injectable Port-A-Cath. The Port a Catheter was left accessed for potential immediate in-hospital utilization. Electronically Signed   By: Sandi Mariscal M.D.   On: 07/22/2017 14:05   Ir Paracentesis  Result Date: 07/12/2017 INDICATION: Recurrent ascites suspicious for carcinomatosis. Request therapeutic paracentesis EXAM: ULTRASOUND GUIDED RIGHT LOWER QUADRANT PARACENTESIS MEDICATIONS: None. COMPLICATIONS: None immediate. PROCEDURE: Informed written consent was obtained from the patient after a discussion of the risks, benefits and alternatives to treatment. A timeout was performed prior to the initiation of the procedure. Initial ultrasound scanning demonstrates a large amount of ascites within the right lower abdominal quadrant. The right lower abdomen was prepped and draped in the usual sterile fashion. 1% lidocaine with epinephrine was used for local anesthesia. Following this, a 19 gauge, 7-cm, Yueh catheter was introduced.  An ultrasound image was saved for documentation purposes. The paracentesis was performed. The catheter was removed and a dressing was applied. The patient tolerated the procedure well without immediate post procedural complication. FINDINGS: A total of approximately 1450 mL of cloudy yellow fluid was removed. IMPRESSION: Successful ultrasound-guided paracentesis yielding 1450 mL of peritoneal fluid. Read by: Ascencion Dike PA-C Electronically Signed   By: Jerilynn Mages.  Shick M.D.   On: 07/12/2017 10:41    ASSESSMENT & PLAN:  Ovarian CA, right ([facility]) She tolerated cycle 1 of chemo well Continue supportive care We may consider omitting GCSF support due to recent severe bone pain I will see her next week for cycle 2 of chemo   Protein-calorie malnutrition, moderate ([place]) She has low protein status Likely due to eating habits and active cancer I encourage her to eat and add nutritional supplements as tolerated  COPD (chronic obstructive pulmonary disease) ([place]) She has  active COPD with dyspnea at rest We discussed conservative management with oxygen, inhalers and incentive spirometry I recommend consideration for CXR today but she declined  Atrial fibrillation ([place]) She is rate controlled and on anticoagulation therapy Continue medical management  Generalized weakness She is slowly improving with physical therapy and rehab I encourage her to continue active PT as much as tolerated  Bilateral leg edema This is likely due to low protein state I do not recommend high dose diuretics    Orders Placed This Encounter  Procedures  . DG Chest 2 View    Standing Status:   Future    Standing Expiration Date:   09/03/2018    Order Specific Question:   Reason for exam:    Answer:   SOB, cough, COPD    Order Specific Question:   Preferred imaging location?    Answer:   [facility] [place]   All questions were answered. The patient knows to call the clinic with any problems, questions or concerns. No barriers to learning was detected. I spent 25 minutes counseling the patient face to face. The total time spent in the appointment was 40 minutes and more than 50% was on counseling and review of test results     Heath Lark, MD 07/30/2017 8:05 PM

## 2017-07-30 NOTE — Assessment & Plan Note (Signed)
She tolerated cycle 1 of chemo well Continue supportive care We may consider omitting GCSF support due to recent severe bone pain I will see her next week for cycle 2 of chemo

## 2017-07-30 NOTE — Assessment & Plan Note (Signed)
She is slowly improving with physical therapy and rehab I encourage her to continue active PT as much as tolerated

## 2017-07-30 NOTE — Assessment & Plan Note (Signed)
She is rate controlled and on anticoagulation therapy Continue medical management

## 2017-07-30 NOTE — Assessment & Plan Note (Signed)
This is likely due to low protein state I do not recommend high dose diuretics

## 2017-07-31 ENCOUNTER — Telehealth: Payer: Self-pay | Admitting: *Deleted

## 2017-07-31 ENCOUNTER — Telehealth: Payer: Self-pay | Admitting: Hematology and Oncology

## 2017-07-31 NOTE — Telephone Encounter (Signed)
Per 10/23 - no los at checkout

## 2017-07-31 NOTE — Telephone Encounter (Signed)
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  Dexamethasone reads to be given at 9:00 pm there day before chemotherapy and at 6:00 am the day of every three weeks.  Will she receive chemotherapy every three weeks?  Is her next appointment August 09, 2017?"  Confirmed chemotherapy regimen is a 21-day cycle.  Yes, chemotherapy has been scheduled for November 2nd therefore on August 08, 2017 at 9:00 pm give five (5) Dexamethasone 4 mg tablets and repeat at 6:00 am on 08-09-2017.  Denies any further questions.

## 2017-08-05 ENCOUNTER — Telehealth: Payer: Self-pay

## 2017-08-05 ENCOUNTER — Other Ambulatory Visit: Payer: Self-pay | Admitting: Hematology and Oncology

## 2017-08-05 DIAGNOSIS — R180 Malignant ascites: Secondary | ICD-10-CM

## 2017-08-05 DIAGNOSIS — C561 Malignant neoplasm of right ovary: Secondary | ICD-10-CM

## 2017-08-05 NOTE — Telephone Encounter (Signed)
Daughter called and left another message. Her mother is SOB with movement and back to using oxygen again. Daughter thinks she needs another tap.

## 2017-08-05 NOTE — Telephone Encounter (Signed)
Called daughter back, she would like Dr. Alvy Bimler to order a paracentesis, she thinks it is fluid in her Mom's belly. Instructed her to get nursing home to evaluate for breathing problems or go to ER and be evaluated if needed.

## 2017-08-05 NOTE — Telephone Encounter (Signed)
Called radiology scheduling, they will call daughter.

## 2017-08-05 NOTE — Telephone Encounter (Signed)
Daughter, [name] called and left message. Patient is at [facility], she had chest xray Friday and it did not show pneumonia. Having trouble with her breathing, she does not think it is the COPD. Daughter thinks that it is fluid, because belly is distended.

## 2017-08-05 NOTE — Telephone Encounter (Signed)
The order is placed urgent for tomorrow Please call

## 2017-08-06 ENCOUNTER — Ambulatory Visit (HOSPITAL_COMMUNITY)
Admission: RE | Admit: 2017-08-06 | Discharge: 2017-08-06 | Disposition: A | Discharge status: Home / Self Care | Payer: Medicare Other | Source: Ambulatory Visit | Attending: Hematology and Oncology | Admitting: Hematology and Oncology

## 2017-08-06 DIAGNOSIS — C561 Malignant neoplasm of right ovary: Secondary | ICD-10-CM | POA: Insufficient documentation

## 2017-08-06 DIAGNOSIS — R180 Malignant ascites: Secondary | ICD-10-CM | POA: Diagnosis present

## 2017-08-06 MED ORDER — LIDOCAINE HCL 2 % IJ SOLN
INTRAMUSCULAR | Status: AC
  Filled 2017-08-06: qty 10

## 2017-08-06 NOTE — Procedures (Signed)
Ultrasound-guided therapeutic paracentesis performed yielding 2.6 liters of chylous/milky colored fluid. No immediate complications.

## 2017-08-09 ENCOUNTER — Encounter: Payer: Self-pay | Admitting: Hematology and Oncology

## 2017-08-09 ENCOUNTER — Ambulatory Visit: Payer: Medicare Other

## 2017-08-09 ENCOUNTER — Telehealth: Payer: Self-pay | Admitting: Hematology and Oncology

## 2017-08-09 ENCOUNTER — Ambulatory Visit (HOSPITAL_BASED_OUTPATIENT_CLINIC_OR_DEPARTMENT_OTHER): Payer: Medicare Other

## 2017-08-09 ENCOUNTER — Ambulatory Visit: Payer: Self-pay

## 2017-08-09 ENCOUNTER — Ambulatory Visit (HOSPITAL_BASED_OUTPATIENT_CLINIC_OR_DEPARTMENT_OTHER): Payer: Medicare Other | Admitting: Hematology and Oncology

## 2017-08-09 ENCOUNTER — Other Ambulatory Visit: Payer: Self-pay

## 2017-08-09 VITALS — BP 131/55 | HR 104 | Temp 97.8°F | Resp 18 | Ht 64.0 in | Wt 191.5 lb

## 2017-08-09 DIAGNOSIS — R609 Edema, unspecified: Secondary | ICD-10-CM | POA: Diagnosis not present

## 2017-08-09 DIAGNOSIS — C561 Malignant neoplasm of right ovary: Secondary | ICD-10-CM

## 2017-08-09 DIAGNOSIS — Z5111 Encounter for antineoplastic chemotherapy: Secondary | ICD-10-CM | POA: Diagnosis present

## 2017-08-09 DIAGNOSIS — J441 Chronic obstructive pulmonary disease with (acute) exacerbation: Secondary | ICD-10-CM

## 2017-08-09 DIAGNOSIS — J438 Other emphysema: Secondary | ICD-10-CM

## 2017-08-09 DIAGNOSIS — I4891 Unspecified atrial fibrillation: Secondary | ICD-10-CM | POA: Diagnosis not present

## 2017-08-09 DIAGNOSIS — R600 Localized edema: Secondary | ICD-10-CM

## 2017-08-09 DIAGNOSIS — I482 Chronic atrial fibrillation, unspecified: Secondary | ICD-10-CM

## 2017-08-09 LAB — COMPREHENSIVE METABOLIC PANEL
ALBUMIN: 2.2 g/dL — AB (ref 3.5–5.0)
ALK PHOS: 54 U/L (ref 40–150)
ALT: 15 U/L (ref 0–55)
AST: 18 U/L (ref 5–34)
Anion Gap: 6 mEq/L (ref 3–11)
BUN: 9.6 mg/dL (ref 7.0–26.0)
CHLORIDE: 100 meq/L (ref 98–109)
CO2: 30 meq/L — AB (ref 22–29)
Calcium: 8 mg/dL — ABNORMAL LOW (ref 8.4–10.4)
Creatinine: 0.7 mg/dL (ref 0.6–1.1)
EGFR: >60 mL/min/{1.73_m2} (ref >60)
GLUCOSE: 225 mg/dL — AB (ref 70–140)
POTASSIUM: 4 meq/L (ref 3.5–5.1)
Sodium: 136 mEq/L (ref 136–145)
TOTAL PROTEIN: 5.8 g/dL — AB (ref 6.4–8.3)
Total Bilirubin: 0.34 mg/dL (ref 0.20–1.20)

## 2017-08-09 LAB — CBC WITH DIFFERENTIAL/PLATELET
BASO%: 0.3 % (ref 0.0–2.0)
BASOS ABS: 0 10*3/uL (ref 0.0–0.1)
EOS ABS: 0 10*3/uL (ref 0.0–0.5)
EOS%: 0 % (ref 0.0–7.0)
HCT: 37.1 % (ref 34.8–46.6)
HEMOGLOBIN: 12.2 g/dL (ref 11.6–15.9)
LYMPH%: 5.1 % — AB (ref 14.0–49.7)
MCH: 28.8 pg (ref 25.1–34.0)
MCHC: 32.8 g/dL (ref 31.5–36.0)
MCV: 87.8 fL (ref 79.5–101.0)
MONO#: 0.1 10*3/uL (ref 0.1–0.9)
MONO%: 0.7 % (ref 0.0–14.0)
NEUT%: 93.9 % — ABNORMAL HIGH (ref 38.4–76.8)
NEUTROS ABS: 8.5 10*3/uL — AB (ref 1.5–6.5)
PLATELETS: 234 10*3/uL (ref 145–400)
RBC: 4.23 10*6/uL (ref 3.70–5.45)
RDW: 16.7 % — ABNORMAL HIGH (ref 11.2–14.5)
WBC: 9 10*3/uL (ref 3.9–10.3)
lymph#: 0.5 10*3/uL — ABNORMAL LOW (ref 0.9–3.3)

## 2017-08-09 MED ORDER — IPRATROPIUM-ALBUTEROL 0.5-2.5 (3) MG/3ML IN SOLN: 3.0000 mL | Freq: Four times a day (QID) | RESPIRATORY_TRACT | Status: DC

## 2017-08-09 MED ORDER — HEPARIN SOD (PORK) LOCK FLUSH 100 UNIT/ML IV SOLN
500.0000 [IU] | Freq: Once | INTRAVENOUS | Status: AC | PRN
  Administered 2017-08-09: 500 [IU]
  Filled 2017-08-09: qty 5

## 2017-08-09 MED ORDER — SODIUM CHLORIDE 0.9% FLUSH
10.0000 mL | Freq: Once | INTRAVENOUS | Status: AC
  Administered 2017-08-09: 10 mL
  Filled 2017-08-09: qty 10

## 2017-08-09 MED ORDER — SODIUM CHLORIDE 0.9 % IV SOLN
487.2000 mg | Freq: Once | INTRAVENOUS | Status: AC
  Administered 2017-08-09: 490 mg via INTRAVENOUS
  Filled 2017-08-09: qty 49

## 2017-08-09 MED ORDER — PALONOSETRON HCL INJECTION 0.25 MG/5ML
0.2500 mg | Freq: Once | INTRAVENOUS | Status: AC
  Administered 2017-08-09: 0.25 mg via INTRAVENOUS

## 2017-08-09 MED ORDER — DIPHENHYDRAMINE HCL 50 MG/ML IJ SOLN
50.0000 mg | Freq: Once | INTRAMUSCULAR | Status: AC
Start: 2017-08-09 — End: 2017-08-09
  Administered 2017-08-09: 50 mg via INTRAVENOUS

## 2017-08-09 MED ORDER — DIPHENHYDRAMINE HCL 50 MG/ML IJ SOLN
INTRAMUSCULAR | Status: AC
  Filled 2017-08-09: qty 1

## 2017-08-09 MED ORDER — ALBUTEROL SULFATE (2.5 MG/3ML) 0.083% IN NEBU
INHALATION_SOLUTION | RESPIRATORY_TRACT | Status: AC
  Filled 2017-08-09: qty 3

## 2017-08-09 MED ORDER — IPRATROPIUM BROMIDE 0.02 % IN SOLN
0.5000 mg | Freq: Once | RESPIRATORY_TRACT | Status: AC
  Administered 2017-08-09: 0.5 mg via RESPIRATORY_TRACT
  Filled 2017-08-09: qty 2.5

## 2017-08-09 MED ORDER — SODIUM CHLORIDE 0.9% FLUSH
10.0000 mL | INTRAVENOUS | Status: DC | PRN
  Administered 2017-08-09: 10 mL
  Filled 2017-08-09: qty 10

## 2017-08-09 MED ORDER — SODIUM CHLORIDE 0.9 % IV SOLN
Freq: Once | INTRAVENOUS | Status: AC
  Administered 2017-08-09: 11:00:00 via INTRAVENOUS

## 2017-08-09 MED ORDER — FAMOTIDINE IN NACL 20-0.9 MG/50ML-% IV SOLN
INTRAVENOUS | Status: AC
  Filled 2017-08-09: qty 50

## 2017-08-09 MED ORDER — PALONOSETRON HCL INJECTION 0.25 MG/5ML
INTRAVENOUS | Status: AC
  Filled 2017-08-09: qty 5

## 2017-08-09 MED ORDER — SODIUM CHLORIDE 0.9 % IV SOLN
Freq: Once | INTRAVENOUS | Status: AC
  Administered 2017-08-09: 11:00:00 via INTRAVENOUS
  Filled 2017-08-09: qty 5

## 2017-08-09 MED ORDER — FAMOTIDINE IN NACL 20-0.9 MG/50ML-% IV SOLN
20.0000 mg | Freq: Once | INTRAVENOUS | Status: AC
  Administered 2017-08-09: 20 mg via INTRAVENOUS

## 2017-08-09 MED ORDER — ALBUTEROL SULFATE (2.5 MG/3ML) 0.083% IN NEBU
2.5000 mg | INHALATION_SOLUTION | Freq: Once | RESPIRATORY_TRACT | Status: AC
  Administered 2017-08-09: 2.5 mg via RESPIRATORY_TRACT
  Filled 2017-08-09: qty 3

## 2017-08-09 MED ORDER — SODIUM CHLORIDE 0.9 % IV SOLN
175.0000 mg/m2 | Freq: Once | INTRAVENOUS | Status: AC
  Administered 2017-08-09: 336 mg via INTRAVENOUS
  Filled 2017-08-09: qty 56

## 2017-08-09 NOTE — Assessment & Plan Note (Signed)
This is likely due to low protein state I do not recommend high dose diuretics

## 2017-08-09 NOTE — Assessment & Plan Note (Signed)
She is rate controlled and on anticoagulation therapy Continue medical management

## 2017-08-09 NOTE — Assessment & Plan Note (Signed)
She has diffuse wheezes secondary to COPD exacerbation I will prescribed nebulizer treatment during treatment chemo I would also add prednisone therapy for 5 days next week I recommend her daughter to contact her pulmonologist to get an appointment to be seen soon to optimize COPD management

## 2017-08-09 NOTE — Telephone Encounter (Signed)
Gave avs and calendar for November

## 2017-08-09 NOTE — Assessment & Plan Note (Addendum)
The patient appears to be frail She had recent COPD exacerbation but I do not feel strongly that I have to reschedule her treatment I have asked the nursing staff to draw tumor marker If tumor marker is still grossly elevated, I might want to repeat CT scan sooner If tumor marker is improved, I plan to hold until after 3 cycles of chemotherapy before restaging Due to concern for bone pain, I plan to see her back next week for further evaluation and repeat blood work I will reduce a dose of G-CSF and to give it only for 2 days next week

## 2017-08-09 NOTE — Progress Notes (Signed)
[facility] OFFICE PROGRESS NOTE  Patient Care Team: Asencion Noble, MD as PCP - General (Internal Medicine)  SUMMARY OF ONCOLOGIC HISTORY:   Ovarian CA, right ([place])   02/18/2016 Tumor Marker    Patient's tumor was tested for the following markers: CA125 Results of the tumor marker test revealed 45      05/22/2016 Tumor Marker    Patient's tumor was tested for the following markers: CA125 Results of the tumor marker test revealed 53      05/22/2016 Imaging    Outside pelvic US showed 4.1 cm adnexa mass      06/24/2017 Imaging    Ct abdomen and pelvis:  1. Interim finding of moderate ascites within the abdomen and pelvis with additional finding of diffuse nodular infiltration of the omentum and anterior mesenteric fat, the appearance would be consistent with peritoneal carcinomatosis/metastatic disease. Increasing retroperitoneal and upper abdominal adenopathy. 2. Re- demonstrated 3.8 cm cyst in the right adnexa. Enlarging soft tissue density in the left adnexa now with possible cystic component posteriorly. In light of the above findings, concern is for ovarian neoplasm. Correlation with pelvic ultrasound recommended. 3. Small right-sided pleural effusion, new since prior study 4. Stable hypodense splenic lesions since 2017.       06/25/2017 Imaging    US pelvis: 2.9 cm simple appearing cyst in the right ovary. Left ovary grossly unremarkable. Large volume ascites in the pelvis      06/30/2017 - 07/01/2017 Hospital Admission    She was admitted for evaluation of abdominal pain and ascites      07/01/2017 Pathology Results    PERITONEAL/ASCITIC FLUID(SPECIMEN 1 OF 1 COLLECTED 07/01/17): - POORLY DIFFERENTIATED CARCINOMA; SEE COMMENT Source Peritoneal/Ascitic Fluid, (specimen 1 of 1 collected 07/01/17) Gross Specimen: Received is/are 1000 cc's of brownish fluid. (BS:bs) Prepared: # Smears: 0 # Concentration Technique Slides (i.e. ThinPrep): 1 # Cell Block:  1 Additional Studies: Also received Hematology slide - [id number]. Comment The tumor cells are positive for cytokeratin 7 and Pax-8 but negative for cytokeratin 20, CDX-2, GATA-3, Napsin-A and TTF-1. Based on the immunoprofile a gynecology primary is favored      07/01/2017 Procedure    Successful ultrasound-guided diagnostic and therapeutic paracentesis yielding 2.5 liters of peritoneal fluid      07/07/2017 - 07/09/2017 Hospital Admission    She was admitted for management of malignant ascites      07/08/2017 Procedure    Successful ultrasound-guided therapeutic paracentesis yielding 2.7 liters liters of peritoneal fluid      07/12/2017 Procedure    Successful ultrasound-guided paracentesis yielding 1450 mL of peritoneal fluid      07/18/2017 - 07/24/2017 Hospital Admission    She was admitted for expedited treatment      07/18/2017 Tumor Marker    Patient's tumor was tested for the following markers: CA125 Results of the tumor marker test revealed 1941      07/19/2017 -  Chemotherapy    The patient had palonosetron (ALOXI) injection 0.25 mg, 0.25 mg, Intravenous,  Once, 1 of 6 cycles  pegfilgrastim (NEULASTA) injection 6 mg, 6 mg, Subcutaneous,  Once, 1 of 6 cycles  CARBOplatin (PARAPLATIN) 490 mg in sodium chloride 0.9 % 250 mL chemo infusion, 490 mg (100 % of original dose 487.2 mg), Intravenous,  Once, 1 of 6 cycles Dose modification:   (original dose 487.2 mg, Cycle 1)  PACLitaxel (TAXOL) 336 mg in dextrose 5 % 500 mL chemo infusion (> 14m/m2), 175 mg/m2 = 336 mg, Intravenous,  Once, 1 of 6 cycles  fosaprepitant (EMEND) 150 mg, dexamethasone (DECADRON) 12 mg in sodium chloride 0.9 % 145 mL IVPB, , Intravenous,  Once, 1 of 6 cycles  for chemotherapy treatment.        08/06/2017 Procedure    Successful ultrasound-guided therapeutic paracentesis yielding 2.6 liters of peritoneal fluid.       INTERVAL HISTORY: Please see below for problem oriented charting. She  returns with her daughter for cycle 2 of chemotherapy She had recent repeat paracentesis Despite paracentesis, her breathing status has not improved She denies cough Her appetite level is fair She denies nausea She denies constipation.  She uses stool softener on a regular basis She has not done much rehab due to significant shortness of breath She denies peripheral neuropathy She is concerned about bone pain with G-CSF support  REVIEW OF SYSTEMS:   Constitutional: Denies fevers, chills or abnormal weight loss Eyes: Denies blurriness of vision Ears, nose, mouth, throat, and face: Denies mucositis or sore throat Respiratory: Denies cough, dyspnea or wheezes Cardiovascular: Denies palpitation, chest discomfort or lower extremity swelling Gastrointestinal:  Denies nausea, heartburn or change in bowel habits Skin: Denies abnormal skin rashes Lymphatics: Denies new lymphadenopathy or easy bruising Neurological:Denies numbness, tingling or new weaknesses Behavioral/Psych: Mood is stable, no new changes  All other systems were reviewed with the patient and are negative.  I have reviewed the past medical history, past surgical history, social history and family history with the patient and they are unchanged from previous note.  ALLERGIES:  is allergic to codeine.  MEDICATIONS:  Current Outpatient Prescriptions  Medication Sig Dispense Refill  . albuterol (PROAIR HFA) 108 (90 Base) MCG/ACT inhaler Inhale 2 puffs into the lungs every 6 (six) hours as needed for wheezing or shortness of breath. 1 Inhaler 3  . ALPRAZolam (XANAX) 0.25 MG tablet Take 1 tablet (0.25 mg total) by mouth at bedtime as needed for anxiety. 30 tablet 0  . azelastine (ASTELIN) 0.1 % nasal spray Place 2 sprays into both nostrils 2 (two) times daily. Use in each nostril as directed    . budesonide-formoterol (SYMBICORT) 160-4.5 MCG/ACT inhaler Inhale 2 puffs into the lungs 2 (two) times daily. 1 Inhaler 3  .  Cholecalciferol (VITAMIN D) 2000 units CAPS Take 2,000 Units by mouth daily.    Marland Kitchen dexamethasone (DECADRON) 4 MG tablet Take 5 tablets at 9 pm the day before chemo and to take 5 tabs at 6 am the day of chemo, every 3 weeks 30 tablet 1  . diltiazem (CARTIA XT) 120 MG 24 hr capsule TAKE 1 CAPSULE BY MOUTH EVERY DAY 90 capsule 3  . diphenoxylate-atropine (LOMOTIL) 2.5-0.025 MG tablet Take 1 tablet by mouth 4 (four) times daily. (Patient taking differently: Take 1 tablet by mouth 4 (four) times daily as needed for diarrhea or loose stools. ) 360 tablet 1  . fluticasone (FLONASE) 50 MCG/ACT nasal spray Place 2 sprays into both nostrils 2 (two) times daily. For nasal congestion    . guaiFENesin-dextromethorphan (ROBITUSSIN DM) 100-10 MG/5ML syrup Take 5 mLs by mouth every 4 (four) hours as needed for cough. 118 mL 0  . levothyroxine (SYNTHROID, LEVOTHROID) 137 MCG tablet Take 137 mcg by mouth every morning. For thyroid therapy    . lidocaine-prilocaine (EMLA) cream Apply 1 application topically as needed. 30 g 6  . loperamide (IMODIUM) 2 MG capsule Take 2 capsules (4 mg total) by mouth 3 (three) times daily. 30 capsule 0  . loratadine (CLARITIN) 10 MG  tablet Take 1 tablet (10 mg total) by mouth daily. 30 tablet 0  . MAGNESIUM GLYCINATE PLUS PO Take 400 mg by mouth daily.    . methylcellulose (ARTIFICIAL TEARS) 1 % ophthalmic solution Place 1 drop into both eyes 2 (two) times daily as needed. Dry eyes    . prochlorperazine (COMPAZINE) 10 MG tablet Take 10 mg by mouth every 6 (six) hours as needed for nausea.  1  . senna-docusate (SENOKOT-S) 8.6-50 MG tablet Take 1 tablet by mouth at bedtime. 30 tablet 0  . XARELTO 20 MG TABS tablet TAKE 1 TABLET BY MOUTH EVERY DAY WITH SUPPER 90 tablet 0   No current facility-administered medications for this visit.    Facility-Administered Medications Ordered in Other Visits  Medication Dose Route Frequency Provider Last Rate Last Dose  . CARBOplatin (PARAPLATIN) 490 mg  in sodium chloride 0.9 % 250 mL chemo infusion  490 mg Intravenous Once Alvy Bimler, Yury Schaus, MD      . fosaprepitant (EMEND) 150 mg, dexamethasone (DECADRON) 12 mg in sodium chloride 0.9 % 145 mL IVPB   Intravenous Once Heath Lark, MD 454 mL/hr at 08/09/17 1059    . heparin lock flush 100 unit/mL  500 Units Intracatheter Once PRN Alvy Bimler, Eithan Beagle, MD      . PACLitaxel (TAXOL) 336 mg in sodium chloride 0.9 % 500 mL chemo infusion (> 60m/m2)  175 mg/m2 (Treatment Plan Recorded) Intravenous Once Braiden Presutti, MD      . sodium chloride flush (NS) 0.9 % injection 10 mL  10 mL Intracatheter PRN GAlvy Bimler Elisah Parmer, MD        PHYSICAL EXAMINATION: ECOG PERFORMANCE STATUS: 2 - Symptomatic, <50% confined to bed  Vitals:   08/09/17 0921  BP: (!) 131/55  Pulse: (!) 104  Resp: 18  Temp: 97.8 F (36.6 C)  SpO2: 97%   Filed Weights   08/09/17 0921  Weight: 191 lb 8 oz (86.9 kg)    GENERAL:alert, no distress and comfortable.  She has oxygen delivered via nasal cannula SKIN: skin color, texture, turgor are normal, no rashes or significant lesions EYES: normal, Conjunctiva are pink and non-injected, sclera clear OROPHARYNX:no exudate, no erythema and lips, buccal mucosa, and tongue normal  NECK: supple, thyroid normal size, non-tender, without nodularity LYMPH:  no palpable lymphadenopathy in the cervical, axillary or inguinal LUNGS: Diffuse wheezes noted.  Mildly increased breathing effort HEART: regular rate & rhythm and no murmurs with moderate bilateral lower extremity edema ABDOMEN:abdomen soft, non-tender and normal bowel sounds Musculoskeletal:no cyanosis of digits and no clubbing  NEURO: alert & oriented x 3 with fluent speech, no focal motor/sensory deficits  LABORATORY DATA:  I have reviewed the data as listed    Component Value Date/Time   NA 136 08/09/2017 0858   K 4.0 08/09/2017 0858   CL 99 (L) 07/23/2017 0536   CO2 30 (H) 08/09/2017 0858   GLUCOSE 225 (H) 08/09/2017 0858   BUN 9.6 08/09/2017  0858   CREATININE 0.7 08/09/2017 0858   CALCIUM 8.0 (L) 08/09/2017 0858   PROT 5.8 (L) 08/09/2017 0858   ALBUMIN 2.2 (L) 08/09/2017 0858   AST 18 08/09/2017 0858   ALT 15 08/09/2017 0858   ALKPHOS 54 08/09/2017 0858   BILITOT 0.34 08/09/2017 0858   GFRNONAA >60 07/23/2017 0536   GFRAA >60 07/23/2017 0536    No results found for: SPEP, UPEP  Lab Results  Component Value Date   WBC 9.0 08/09/2017   NEUTROABS 8.5 (H) 08/09/2017   HGB 12.2  08/09/2017   HCT 37.1 08/09/2017   MCV 87.8 08/09/2017   PLT 234 08/09/2017      Chemistry      Component Value Date/Time   NA 136 08/09/2017 0858   K 4.0 08/09/2017 0858   CL 99 (L) 07/23/2017 0536   CO2 30 (H) 08/09/2017 0858   BUN 9.6 08/09/2017 0858   CREATININE 0.7 08/09/2017 0858      Component Value Date/Time   CALCIUM 8.0 (L) 08/09/2017 0858   ALKPHOS 54 08/09/2017 0858   AST 18 08/09/2017 0858   ALT 15 08/09/2017 0858   BILITOT 0.34 08/09/2017 0858       RADIOGRAPHIC STUDIES: I have personally reviewed the radiological images as listed and agreed with the findings in the report. Dg Chest 2 View  Result Date: 07/21/2017 CLINICAL DATA:  Productive cough. History of ovarian carcinoma with current chemotherapy. COPD exacerbation. EXAM: CHEST  2 VIEW COMPARISON:  07/19/2017 FINDINGS: The heart size and mediastinal contours are within normal limits. Increased opacity at the right lung base likely mostly relates to pleural fluid. However, there may be a component of pneumonia present. Small left pleural effusion present. The visualized skeletal structures are unremarkable. IMPRESSION: Increased opacity at the right lung base since the prior radiograph 2 days ago. This is felt to mostly relate to a moderate pleural effusion. However, component of pneumonia may be present. A small left pleural effusion remains present. Electronically Signed   By: Aletta Edouard M.D.   On: 07/21/2017 12:28   Dg Chest 2 View  Result Date:  07/19/2017 CLINICAL DATA:  Cough EXAM: CHEST  2 VIEW COMPARISON:  07/07/2017 FINDINGS: Small bilateral pleural effusions. Right basilar atelectasis. Heart is normal size. No effusions or acute bony abnormality. IMPRESSION: Small effusions.  Right base atelectasis. Electronically Signed   By: Rolm Baptise M.D.   On: 07/19/2017 12:00   US Abdomen Limited  Result Date: 07/19/2017 CLINICAL DATA:  83 year old female with suspected ascites. Paracentesis is requested. EXAM: LIMITED ABDOMEN ULTRASOUND FOR ASCITES TECHNIQUE: Limited ultrasound survey for ascites was performed in all four abdominal quadrants. COMPARISON:  Paracentesis performed 07/08/2017 FINDINGS: Ultrasound interrogation of the abdomen demonstrates trace ascites. There is insufficient fluid for safe paracentesis at this time. IMPRESSION: Trace ascites.  Paracentesis was deferred. Electronically Signed   By: Jacqulynn Cadet M.D.   On: 07/19/2017 13:33   US Paracentesis  Result Date: 08/06/2017 INDICATION: Ovarian cancer with recurrent malignant ascites; request made for therapeutic paracentesis. EXAM: ULTRASOUND GUIDED THERAPEUTIC PARACENTESIS MEDICATIONS: None. COMPLICATIONS: None immediate. PROCEDURE: Informed written consent was obtained from the patient after a discussion of the risks, benefits and alternatives to treatment. A timeout was performed prior to the initiation of the procedure. Initial ultrasound scanning demonstrates a small to moderate amount of ascites within the right lower abdominal quadrant. The right lower abdomen was prepped and draped in the usual sterile fashion. 2% lidocaine was used for local anesthesia. Following this, a Safe-T-Centesis catheter was introduced. An ultrasound image was saved for documentation purposes. The paracentesis was performed. The catheter was removed and a dressing was applied. The patient tolerated the procedure well without immediate post procedural complication. FINDINGS: A total of  approximately 2.6 liters of chylous/milky colored fluid was removed. IMPRESSION: Successful ultrasound-guided therapeutic paracentesis yielding 2.6 liters of peritoneal fluid. Read by: Rowe Robert, PA-C Electronically Signed   By: Marybelle Killings M.D.   On: 08/06/2017 12:43   Ir US Guide Vasc Access Right  Result Date: 07/22/2017 INDICATION:  History of ovarian cancer, in need of durable intravenous access for chemotherapy administration. EXAM: IMPLANTED PORT A CATH PLACEMENT WITH ULTRASOUND AND FLUOROSCOPIC GUIDANCE COMPARISON:  None. MEDICATIONS: Ancef 2 gm IV; The antibiotic was administered within an appropriate time interval prior to skin puncture. ANESTHESIA/SEDATION: Moderate (conscious) sedation was employed during this procedure. A total of Versed 2 mg and Fentanyl 100 mcg was administered intravenously. Moderate Sedation Time: 21 minutes. The patient's level of consciousness and vital signs were monitored continuously by radiology nursing throughout the procedure under my direct supervision. CONTRAST:  None FLUOROSCOPY TIME:  18 seconds (9 mGy) COMPLICATIONS: None immediate. PROCEDURE: The procedure, risks, benefits, and alternatives were explained to the patient. Questions regarding the procedure were encouraged and answered. The patient understands and consents to the procedure. The right neck and chest were prepped with chlorhexidine in a sterile fashion, and a sterile drape was applied covering the operative field. Maximum barrier sterile technique with sterile gowns and gloves were used for the procedure. A timeout was performed prior to the initiation of the procedure. Local anesthesia was provided with 1% lidocaine with epinephrine. After creating a small venotomy incision, a micropuncture kit was utilized to access the internal jugular vein. Real-time ultrasound guidance was utilized for vascular access including the acquisition of a permanent ultrasound image documenting patency of the accessed  vessel. The microwire was utilized to measure appropriate catheter length. A subcutaneous port pocket was then created along the upper chest wall utilizing a combination of sharp and blunt dissection. The pocket was irrigated with sterile saline. A single lumen ISP power injectable port was chosen for placement. The 8 Fr catheter was tunneled from the port pocket site to the venotomy incision. The port was placed in the pocket. The external catheter was trimmed to appropriate length. At the venotomy, an 8 Fr peel-away sheath was placed over a guidewire under fluoroscopic guidance. The catheter was then placed through the sheath and the sheath was removed. Final catheter positioning was confirmed and documented with a fluoroscopic spot radiograph. The port was accessed with a Huber needle, aspirated and flushed. The venotomy site was closed with an interrupted 4-0 Vicryl suture. The port pocket incision was closed with interrupted 2-0 Vicryl suture and the skin was opposed with a running subcuticular 4-0 Vicryl suture. Dermabond and Steri-strips were applied to both incisions. The Port a Catheter was left accessed for in-hospital utilization. Dressings were placed. The patient tolerated the procedure well without immediate post procedural complication. FINDINGS: After catheter placement, the tip lies within the superior cavoatrial junction. The catheter aspirates and flushes normally and is ready for immediate use. IMPRESSION: Successful placement of a right internal jugular approach power injectable Port-A-Cath. The Port a Catheter was left accessed for potential immediate in-hospital utilization. Electronically Signed   By: Sandi Mariscal M.D.   On: 07/22/2017 14:05   Ir Fluoro Guide Port Insertion Right  Result Date: 07/22/2017 INDICATION: History of ovarian cancer, in need of durable intravenous access for chemotherapy administration. EXAM: IMPLANTED PORT A CATH PLACEMENT WITH ULTRASOUND AND FLUOROSCOPIC  GUIDANCE COMPARISON:  None. MEDICATIONS: Ancef 2 gm IV; The antibiotic was administered within an appropriate time interval prior to skin puncture. ANESTHESIA/SEDATION: Moderate (conscious) sedation was employed during this procedure. A total of Versed 2 mg and Fentanyl 100 mcg was administered intravenously. Moderate Sedation Time: 21 minutes. The patient's level of consciousness and vital signs were monitored continuously by radiology nursing throughout the procedure under my direct supervision. CONTRAST:  None FLUOROSCOPY TIME:  18 seconds (9 mGy) COMPLICATIONS: None immediate. PROCEDURE: The procedure, risks, benefits, and alternatives were explained to the patient. Questions regarding the procedure were encouraged and answered. The patient understands and consents to the procedure. The right neck and chest were prepped with chlorhexidine in a sterile fashion, and a sterile drape was applied covering the operative field. Maximum barrier sterile technique with sterile gowns and gloves were used for the procedure. A timeout was performed prior to the initiation of the procedure. Local anesthesia was provided with 1% lidocaine with epinephrine. After creating a small venotomy incision, a micropuncture kit was utilized to access the internal jugular vein. Real-time ultrasound guidance was utilized for vascular access including the acquisition of a permanent ultrasound image documenting patency of the accessed vessel. The microwire was utilized to measure appropriate catheter length. A subcutaneous port pocket was then created along the upper chest wall utilizing a combination of sharp and blunt dissection. The pocket was irrigated with sterile saline. A single lumen ISP power injectable port was chosen for placement. The 8 Fr catheter was tunneled from the port pocket site to the venotomy incision. The port was placed in the pocket. The external catheter was trimmed to appropriate length. At the venotomy, an 8 Fr  peel-away sheath was placed over a guidewire under fluoroscopic guidance. The catheter was then placed through the sheath and the sheath was removed. Final catheter positioning was confirmed and documented with a fluoroscopic spot radiograph. The port was accessed with a Huber needle, aspirated and flushed. The venotomy site was closed with an interrupted 4-0 Vicryl suture. The port pocket incision was closed with interrupted 2-0 Vicryl suture and the skin was opposed with a running subcuticular 4-0 Vicryl suture. Dermabond and Steri-strips were applied to both incisions. The Port a Catheter was left accessed for in-hospital utilization. Dressings were placed. The patient tolerated the procedure well without immediate post procedural complication. FINDINGS: After catheter placement, the tip lies within the superior cavoatrial junction. The catheter aspirates and flushes normally and is ready for immediate use. IMPRESSION: Successful placement of a right internal jugular approach power injectable Port-A-Cath. The Port a Catheter was left accessed for potential immediate in-hospital utilization. Electronically Signed   By: Sandi Mariscal M.D.   On: 07/22/2017 14:05   Ir Paracentesis  Result Date: 07/12/2017 INDICATION: Recurrent ascites suspicious for carcinomatosis. Request therapeutic paracentesis EXAM: ULTRASOUND GUIDED RIGHT LOWER QUADRANT PARACENTESIS MEDICATIONS: None. COMPLICATIONS: None immediate. PROCEDURE: Informed written consent was obtained from the patient after a discussion of the risks, benefits and alternatives to treatment. A timeout was performed prior to the initiation of the procedure. Initial ultrasound scanning demonstrates a large amount of ascites within the right lower abdominal quadrant. The right lower abdomen was prepped and draped in the usual sterile fashion. 1% lidocaine with epinephrine was used for local anesthesia. Following this, a 19 gauge, 7-cm, Yueh catheter was introduced. An  ultrasound image was saved for documentation purposes. The paracentesis was performed. The catheter was removed and a dressing was applied. The patient tolerated the procedure well without immediate post procedural complication. FINDINGS: A total of approximately 1450 mL of cloudy yellow fluid was removed. IMPRESSION: Successful ultrasound-guided paracentesis yielding 1450 mL of peritoneal fluid. Read by: Ascencion Dike PA-C Electronically Signed   By: Jerilynn Mages.  Shick M.D.   On: 07/12/2017 10:41    ASSESSMENT & PLAN:  Ovarian CA, right ([place]) The patient appears to be frail She had recent COPD exacerbation but I do not feel strongly that  I have to reschedule her treatment I have asked the nursing staff to draw tumor marker If tumor marker is still grossly elevated, I might want to repeat CT scan sooner If tumor marker is improved, I plan to hold until after 3 cycles of chemotherapy before restaging Due to concern for bone pain, I plan to see her back next week for further evaluation and repeat blood work I will reduce a dose of G-CSF and to give it only for 2 days next week  COPD (chronic obstructive pulmonary disease) ([place]) She has diffuse wheezes secondary to COPD exacerbation I will prescribed nebulizer treatment during treatment chemo I would also add prednisone therapy for 5 days next week I recommend her daughter to contact her pulmonologist to get an appointment to be seen soon to optimize COPD management  Bilateral leg edema This is likely due to low protein state I do not recommend high dose diuretics   Atrial fibrillation ([place]) She is rate controlled and on anticoagulation therapy Continue medical management   Orders Placed This Encounter  Procedures  . CA 125    Standing Status:   Standing    Number of Occurrences:   9    Standing Expiration Date:   08/09/2018   All questions were answered. The patient knows to call the clinic with any problems, questions or concerns. No  barriers to learning was detected. I spent 30 minutes counseling the patient face to face. The total time spent in the appointment was 40 minutes and more than 50% was on counseling and review of test results     Heath Lark, MD 08/09/2017 11:07 AM

## 2017-08-09 NOTE — Patient Instructions (Signed)
   [facility] Discharge Instructions for Patients Receiving Chemotherapy  Today you received the following chemotherapy agents Taxol and Carboplatin   To help prevent nausea and vomiting after your treatment, we encourage you to take your nausea medication as directed.    If you develop nausea and vomiting that is not controlled by your nausea medication, call the clinic.   BELOW ARE SYMPTOMS THAT SHOULD BE REPORTED IMMEDIATELY:  *FEVER GREATER THAN 100.5 F  *CHILLS WITH OR WITHOUT FEVER  NAUSEA AND VOMITING THAT IS NOT CONTROLLED WITH YOUR NAUSEA MEDICATION  *UNUSUAL SHORTNESS OF BREATH  *UNUSUAL BRUISING OR BLEEDING  TENDERNESS IN MOUTH AND THROAT WITH OR WITHOUT PRESENCE OF ULCERS  *URINARY PROBLEMS  *BOWEL PROBLEMS  UNUSUAL RASH Items with * indicate a potential emergency and should be followed up as soon as possible.  Feel free to call the clinic should you have any questions or concerns. The clinic phone number is [phone].  Please show the CHEMO ALERT CARD at check-in to the Emergency Department and triage nurse.   

## 2017-08-10 LAB — CA 125: Cancer Antigen (CA) 125: 1665 U/mL — ABNORMAL HIGH (ref 0.0–38.1)

## 2017-08-12 ENCOUNTER — Encounter: Payer: Self-pay | Admitting: Pulmonary Disease

## 2017-08-12 ENCOUNTER — Ambulatory Visit (INDEPENDENT_AMBULATORY_CARE_PROVIDER_SITE_OTHER): Payer: Medicare Other | Admitting: Pulmonary Disease

## 2017-08-12 ENCOUNTER — Telehealth: Payer: Self-pay | Admitting: *Deleted

## 2017-08-12 ENCOUNTER — Ambulatory Visit (INDEPENDENT_AMBULATORY_CARE_PROVIDER_SITE_OTHER)
Admission: RE | Admit: 2017-08-12 | Discharge: 2017-08-12 | Disposition: A | Discharge status: Home / Self Care | Payer: Medicare Other | Source: Ambulatory Visit | Attending: Pulmonary Disease | Admitting: Pulmonary Disease

## 2017-08-12 ENCOUNTER — Other Ambulatory Visit (INDEPENDENT_AMBULATORY_CARE_PROVIDER_SITE_OTHER): Payer: Medicare Other

## 2017-08-12 VITALS — BP 110/68 | HR 101 | Ht 64.0 in

## 2017-08-12 DIAGNOSIS — R0600 Dyspnea, unspecified: Secondary | ICD-10-CM

## 2017-08-12 DIAGNOSIS — R600 Localized edema: Secondary | ICD-10-CM

## 2017-08-12 DIAGNOSIS — J449 Chronic obstructive pulmonary disease, unspecified: Secondary | ICD-10-CM | POA: Diagnosis not present

## 2017-08-12 LAB — BASIC METABOLIC PANEL
BUN: 12 mg/dL (ref 6–23)
CALCIUM: 7.9 mg/dL — AB (ref 8.4–10.5)
CO2: 35 meq/L — AB (ref 19–32)
CREATININE: 0.56 mg/dL (ref 0.40–1.20)
Chloride: 94 mEq/L — ABNORMAL LOW (ref 96–112)
GFR: 110.08 mL/min (ref >60.00)
GLUCOSE: 114 mg/dL — AB (ref 70–99)
Potassium: 3.1 mEq/L — ABNORMAL LOW (ref 3.5–5.1)
SODIUM: 135 meq/L (ref 135–145)

## 2017-08-12 MED ORDER — FUROSEMIDE 40 MG PO TABS: ORAL_TABLET | ORAL | 5 refills | Status: DC

## 2017-08-12 NOTE — Telephone Encounter (Signed)
-----   Message from Heath Lark, MD sent at 08/12/2017  8:31 AM EST ----- Regarding: CA125 Pls let her/daughter CA125 is improving, plan to continue cycle 3 first before repeat CT ----- Message ----- From: Interface, Lab In Three Zero One Sent: 08/09/2017   9:21 AM To: Heath Lark, MD

## 2017-08-12 NOTE — Progress Notes (Signed)
Subjective:    Patient ID: [name], female    DOB: Feb 11, 1933, 83 y.o.   MRN: [id number]  Synopsis: Patient of Dr. Lamonte Sakai who has allergic rhinitis, COPD and a distant history of psoriatic arthritis with prior methotrexate use  HPI Chief Complaint  Patient presents with  . Acute Visit    [facility] pt being treated for emphysema c/o increased SOB X2 wks.  pt recently dx'ed with PNA, had over 2L of fluid drawn off.     [name] has been receiving treatment for ovarian cancer lately.  She notices that she is holding onto a lot of fluid lately and has been having a harder time with shortness of breath with participating in physical therapy.  She hsa noticed this for 2 weeks, it is getting worse.  She had "a touch of pneumonia" while she was hospitalized a few weeks ago and had a cough then.  She can only walk a few feet without getting short of breath.  She can't lay flat due to dyspnea, this is a new problem.  She is currently in rehab.    She doesn't have any known CHF, though she has afib.    No recent fever, some chills.  She hasn't started the prednisone yet. Though she took decadron on Thursday and Friday which helped her breathing.  Today she feels worse and her breathing is worse.  She says her weight is up.   She is taking lasix daily, but she doesn't go wihtin an hour of taking the pill.      Past Medical History:  Diagnosis Date  . COPD (chronic obstructive pulmonary disease) ([place])   . Depression   . Diverticulosis   . Family hx of colon cancer   . Fibromyalgia   . GERD (gastroesophageal reflux disease)   . Hemorrhoids   . Hiatal hernia   . History of shingles   . Hypothyroidism   . IBS (irritable bowel syndrome)   . Lymphocytic colitis   . Osteoporosis   . Paroxysmal atrial fibrillation ([facility])   . Psoriatic arthritis ([place])   . Rectal polyp   . Schatzki's ring   . Uterine polyp       Review of Systems  Constitutional: Positive for fatigue. Negative for chills  and fever.  HENT: Negative for postnasal drip, rhinorrhea and sinus pain.   Respiratory: Positive for shortness of breath. Negative for cough and wheezing.   Cardiovascular: Positive for leg swelling. Negative for chest pain and palpitations.       Objective:   Physical Exam Vitals:   08/12/17 1030  BP: 110/68  Pulse: (!) 101  SpO2: 97%   Gen: chronically ill appearing, no acute distress, in wheelchair HENT: NCAT, OP clear, neck supple without masses Eyes: PERRL, EOMi Lymph: no cervical lymphadenopathy PULM: No wheezing, crackles in the bases of her lungs CV: RRR, no mgr, no JVD GI: BS+, soft, nontender, no hsm Derm: no rash or skin breakdown, massive edema in the legs bilaterally MSK: normal bulk and tone Neuro: A&Ox4, CN II-XII intact, strength 5/5 in all 4 extremities Psyche: normal mood and affect    CBC    Component Value Date/Time   WBC 9.0 08/09/2017 0858   WBC 25.3 (H) 07/23/2017 0536   RBC 4.23 08/09/2017 0858   RBC 4.70 07/23/2017 0536   HGB 12.2 08/09/2017 0858   HCT 37.1 08/09/2017 0858   PLT 234 08/09/2017 0858   MCV 87.8 08/09/2017 0858   MCH 28.8 08/09/2017  0858   MCH 29.1 07/23/2017 0536   MCHC 32.8 08/09/2017 0858   MCHC 32.9 07/23/2017 0536   RDW 16.7 (H) 08/09/2017 0858   LYMPHSABS 0.5 (L) 08/09/2017 0858   MONOABS 0.1 08/09/2017 0858   EOSABS 0.0 08/09/2017 0858   BASOSABS 0.0 08/09/2017 0858   Records from her visit with Dr. Alvy Bimler reviewed, she is being treated for ovarian cancer, she was treated for a COPD exacerbation with prednisone.  October 15th 2018 chest x-ray images independently reviewed showing a right sided pleural effusion with atelectasis.  Records from her last visit with Dr. Lamonte Sakai reviewed where Anoro was changed to Symbicort    Assessment & Plan:   Dyspnea, unspecified type - Plan: DG Chest 2 View, Pro b natriuretic peptide, Basic Metabolic Panel (BMET), DME Other see comment  Bilateral leg edema  COPD with chronic  bronchitis and emphysema ([place])  Discussion: [name] presents today with worsening shortness of breath.  Based on her physical exam I think this is directly related to volume overload, it is not clear to me if she has CHF or not, notably she had a normal echocardiogram in 2016.  I agree that the volume retention may be secondary to her low albumin value, but we will check a brain natruretic peptide today to see if there is evidence of cardiac involvement.  Further, based on her recent chest x-ray I do worry about the possibility of a pleural effusion.  Her exam is somewhat reassuring that she does not have this today.  Finally, I do not hear wheezing so I doubt she is having a COPD exacerbation, I worry mostly about pulmonary edema.  Plan: COPD: At this time it is not clear to me that you are having an exacerbation, perhaps the Decadron helped Continue taking Symbicort 2 puffs twice a day I would not take the prednisone this week  Shortness of breath: I think this is directly related to all the extra fluid you are holding: I do worry about a pleural effusion so we will get a chest x-ray today. Take Lasix 40 mg twice a day, 8 AM and 2 PM We will check lab work today to see which her kidney function and brain natruretic peptide levels are doing, depending on these results she may need to have an echocardiogram  We will see you back later this week, Thursday afternoon    Current Outpatient Medications:  .  albuterol (PROAIR HFA) 108 (90 Base) MCG/ACT inhaler, Inhale 2 puffs into the lungs every 6 (six) hours as needed for wheezing or shortness of breath., Disp: 1 Inhaler, Rfl: 3 .  ALPRAZolam (XANAX) 0.25 MG tablet, Take 1 tablet (0.25 mg total) by mouth at bedtime as needed for anxiety., Disp: 30 tablet, Rfl: 0 .  azelastine (ASTELIN) 0.1 % nasal spray, Place 2 sprays into both nostrils 2 (two) times daily. Use in each nostril as directed, Disp: , Rfl:  .  budesonide-formoterol (SYMBICORT)  160-4.5 MCG/ACT inhaler, Inhale 2 puffs into the lungs 2 (two) times daily., Disp: 1 Inhaler, Rfl: 3 .  Cholecalciferol (VITAMIN D) 2000 units CAPS, Take 2,000 Units by mouth daily., Disp: , Rfl:  .  dexamethasone (DECADRON) 4 MG tablet, Take 5 tablets at 9 pm the day before chemo and to take 5 tabs at 6 am the day of chemo, every 3 weeks, Disp: 30 tablet, Rfl: 1 .  diltiazem (CARTIA XT) 120 MG 24 hr capsule, TAKE 1 CAPSULE BY MOUTH EVERY DAY, Disp: 90 capsule, Rfl:  3 .  diphenoxylate-atropine (LOMOTIL) 2.5-0.025 MG tablet, Take 1 tablet by mouth 4 (four) times daily. (Patient taking differently: Take 1 tablet by mouth 4 (four) times daily as needed for diarrhea or loose stools. ), Disp: 360 tablet, Rfl: 1 .  fluticasone (FLONASE) 50 MCG/ACT nasal spray, Place 2 sprays into both nostrils 2 (two) times daily. For nasal congestion, Disp: , Rfl:  .  levothyroxine (SYNTHROID, LEVOTHROID) 137 MCG tablet, Take 137 mcg by mouth every morning. For thyroid therapy, Disp: , Rfl:  .  lidocaine-prilocaine (EMLA) cream, Apply 1 application topically as needed., Disp: 30 g, Rfl: 6 .  loperamide (IMODIUM) 2 MG capsule, Take 2 capsules (4 mg total) by mouth 3 (three) times daily., Disp: 30 capsule, Rfl: 0 .  MAGNESIUM GLYCINATE PLUS PO, Take 400 mg by mouth daily., Disp: , Rfl:  .  methylcellulose (ARTIFICIAL TEARS) 1 % ophthalmic solution, Place 1 drop into both eyes 2 (two) times daily as needed. Dry eyes, Disp: , Rfl:  .  prochlorperazine (COMPAZINE) 10 MG tablet, Take 10 mg by mouth every 6 (six) hours as needed for nausea., Disp: , Rfl: 1 .  senna-docusate (SENOKOT-S) 8.6-50 MG tablet, Take 1 tablet by mouth at bedtime., Disp: 30 tablet, Rfl: 0 .  XARELTO 20 MG TABS tablet, TAKE 1 TABLET BY MOUTH EVERY DAY WITH SUPPER, Disp: 90 tablet, Rfl: 0 .  furosemide (LASIX) 40 MG tablet, Take 1 tab twice daily take at 8am and 2pm., Disp: 60 tablet, Rfl: 5

## 2017-08-12 NOTE — Telephone Encounter (Signed)
Notified of message below

## 2017-08-12 NOTE — Patient Instructions (Signed)
COPD: At this time it is not clear to me that you are having an exacerbation, perhaps the Decadron helped Continue taking Symbicort 2 puffs twice a day I would not take the prednisone this week  Shortness of breath: I think this is directly related to all the extra fluid you are holding: I do worry about a pleural effusion so we will get a chest x-ray today. Take Lasix 40 mg twice a day, 8 AM and 2 PM We will check lab work today to see which her kidney function and brain natruretic peptide levels are doing, depending on these results she may need to have an echocardiogram  We will see you back later this week, Thursday afternoon

## 2017-08-14 LAB — PRO B NATRIURETIC PEPTIDE: NT-Pro BNP: 837 pg/mL — ABNORMAL HIGH (ref 0–738)

## 2017-08-15 ENCOUNTER — Other Ambulatory Visit: Payer: Self-pay

## 2017-08-15 ENCOUNTER — Ambulatory Visit (HOSPITAL_BASED_OUTPATIENT_CLINIC_OR_DEPARTMENT_OTHER): Payer: Medicare Other

## 2017-08-15 ENCOUNTER — Encounter: Payer: Self-pay | Admitting: Hematology and Oncology

## 2017-08-15 ENCOUNTER — Telehealth: Payer: Self-pay | Admitting: Pulmonary Disease

## 2017-08-15 ENCOUNTER — Other Ambulatory Visit (HOSPITAL_BASED_OUTPATIENT_CLINIC_OR_DEPARTMENT_OTHER): Payer: Medicare Other

## 2017-08-15 ENCOUNTER — Ambulatory Visit (HOSPITAL_BASED_OUTPATIENT_CLINIC_OR_DEPARTMENT_OTHER): Payer: Medicare Other | Admitting: Hematology and Oncology

## 2017-08-15 ENCOUNTER — Telehealth: Payer: Self-pay

## 2017-08-15 ENCOUNTER — Ambulatory Visit: Payer: Self-pay | Admitting: Adult Health

## 2017-08-15 DIAGNOSIS — C561 Malignant neoplasm of right ovary: Secondary | ICD-10-CM

## 2017-08-15 DIAGNOSIS — E440 Moderate protein-calorie malnutrition: Secondary | ICD-10-CM

## 2017-08-15 DIAGNOSIS — J449 Chronic obstructive pulmonary disease, unspecified: Secondary | ICD-10-CM

## 2017-08-15 DIAGNOSIS — R609 Edema, unspecified: Secondary | ICD-10-CM | POA: Diagnosis not present

## 2017-08-15 DIAGNOSIS — R0600 Dyspnea, unspecified: Secondary | ICD-10-CM

## 2017-08-15 DIAGNOSIS — L03116 Cellulitis of left lower limb: Secondary | ICD-10-CM | POA: Diagnosis not present

## 2017-08-15 DIAGNOSIS — Z5189 Encounter for other specified aftercare: Secondary | ICD-10-CM | POA: Diagnosis not present

## 2017-08-15 DIAGNOSIS — F329 Major depressive disorder, single episode, unspecified: Secondary | ICD-10-CM

## 2017-08-15 DIAGNOSIS — E876 Hypokalemia: Secondary | ICD-10-CM

## 2017-08-15 DIAGNOSIS — R600 Localized edema: Secondary | ICD-10-CM

## 2017-08-15 DIAGNOSIS — I482 Chronic atrial fibrillation, unspecified: Secondary | ICD-10-CM

## 2017-08-15 DIAGNOSIS — M7989 Other specified soft tissue disorders: Secondary | ICD-10-CM

## 2017-08-15 DIAGNOSIS — L03115 Cellulitis of right lower limb: Secondary | ICD-10-CM | POA: Diagnosis not present

## 2017-08-15 DIAGNOSIS — T50905A Adverse effect of unspecified drugs, medicaments and biological substances, initial encounter: Secondary | ICD-10-CM

## 2017-08-15 DIAGNOSIS — I4891 Unspecified atrial fibrillation: Secondary | ICD-10-CM | POA: Diagnosis not present

## 2017-08-15 DIAGNOSIS — D61818 Other pancytopenia: Secondary | ICD-10-CM | POA: Diagnosis not present

## 2017-08-15 DIAGNOSIS — J438 Other emphysema: Secondary | ICD-10-CM

## 2017-08-15 LAB — COMPREHENSIVE METABOLIC PANEL
ALK PHOS: 48 U/L (ref 40–150)
ALT: 25 U/L (ref 0–55)
ANION GAP: 11 meq/L (ref 3–11)
AST: 25 U/L (ref 5–34)
Albumin: 2.6 g/dL — ABNORMAL LOW (ref 3.5–5.0)
BILIRUBIN TOTAL: 0.85 mg/dL (ref 0.20–1.20)
BUN: 9.1 mg/dL (ref 7.0–26.0)
CO2: 33 meq/L — AB (ref 22–29)
Calcium: 7.7 mg/dL — ABNORMAL LOW (ref 8.4–10.4)
Chloride: 90 mEq/L — ABNORMAL LOW (ref 98–109)
Creatinine: 0.7 mg/dL (ref 0.6–1.1)
Glucose: 115 mg/dl (ref 70–140)
Potassium: 2.6 mEq/L — CL (ref 3.5–5.1)
Sodium: 134 mEq/L — ABNORMAL LOW (ref 136–145)
TOTAL PROTEIN: 6.1 g/dL — AB (ref 6.4–8.3)

## 2017-08-15 LAB — CBC WITH DIFFERENTIAL/PLATELET
BASO%: 0.4 % (ref 0.0–2.0)
Basophils Absolute: 0 10*3/uL (ref 0.0–0.1)
EOS%: 4.4 % (ref 0.0–7.0)
Eosinophils Absolute: 0.1 10*3/uL (ref 0.0–0.5)
HCT: 35.8 % (ref 34.8–46.6)
HEMOGLOBIN: 11.3 g/dL — AB (ref 11.6–15.9)
LYMPH#: 0.5 10*3/uL — AB (ref 0.9–3.3)
LYMPH%: 16.4 % (ref 14.0–49.7)
MCH: 28.5 pg (ref 25.1–34.0)
MCHC: 31.6 g/dL (ref 31.5–36.0)
MCV: 90.4 fL (ref 79.5–101.0)
MONO#: 0 10*3/uL — ABNORMAL LOW (ref 0.1–0.9)
MONO%: 1.1 % (ref 0.0–14.0)
NEUT#: 2.1 10*3/uL (ref 1.5–6.5)
NEUT%: 77.7 % — ABNORMAL HIGH (ref 38.4–76.8)
Platelets: 208 10*3/uL (ref 145–400)
RBC: 3.96 10*6/uL (ref 3.70–5.45)
RDW: 15.6 % — AB (ref 11.2–14.5)
WBC: 2.7 10*3/uL — ABNORMAL LOW (ref 3.9–10.3)

## 2017-08-15 MED ORDER — CEPHALEXIN 500 MG PO CAPS: 500.0000 mg | ORAL_CAPSULE | Freq: Three times a day (TID) | ORAL | 0 refills | Status: DC

## 2017-08-15 MED ORDER — TBO-FILGRASTIM 300 MCG/0.5ML ~~LOC~~ SOSY
300.0000 ug | PREFILLED_SYRINGE | Freq: Once | SUBCUTANEOUS | Status: AC
  Administered 2017-08-15: 300 ug via SUBCUTANEOUS
  Filled 2017-08-15: qty 0.5

## 2017-08-15 MED ORDER — POTASSIUM CHLORIDE CRYS ER 20 MEQ PO TBCR: 20.0000 meq | EXTENDED_RELEASE_TABLET | Freq: Two times a day (BID) | ORAL | 0 refills | Status: DC

## 2017-08-15 NOTE — Telephone Encounter (Signed)
Apt for 08/16/17 has been canceled.  [name] is aware of BQ below message and voiced her understanding.  Nothing further needed.

## 2017-08-15 NOTE — Assessment & Plan Note (Signed)
The patient has positive response to treatment However, she is frail with multiple comorbidities Both the patient and her daughter are very sad, ruminating on the fact that she was diagnosed at stage III disease She was upset that her surveillance screening was discontinued prior to the diagnosis of ovarian cancer At this point, I recommend we continue aggressive supportive care and move forward with treatment She will receive G-CSF support today and tomorrow  I have encouraged her daughter to call me next week for update on her progress I will see her again with the following week for supportive care

## 2017-08-15 NOTE — Telephone Encounter (Signed)
OK, but if she gets worse they need to call back

## 2017-08-15 NOTE — Assessment & Plan Note (Signed)
She has significant leg edema due to low protein status, poor mobility and evidence of fluid overload She was seen by pulmonologist recently ProBNP level was high and she was started on Lasix therapy I recommend she continues to same I also recommend referring her back to cardiologist for further management and she agreed

## 2017-08-15 NOTE — Assessment & Plan Note (Signed)
She has chronic COPD, oxygen dependent She willfollow closely with pulmonologist for further management

## 2017-08-15 NOTE — Patient Instructions (Signed)
Tbo-Filgrastim injection What is this medicine? TBO-FILGRASTIM (T B O fil GRA stim) is a granulocyte colony-stimulating factor that stimulates the growth of neutrophils, a type of white blood cell important in the body's fight against infection. It is used to reduce the incidence of fever and infection in patients with certain types of cancer who are receiving chemotherapy that affects the bone marrow. This medicine may be used for other purposes; ask your health care provider or pharmacist if you have questions. COMMON BRAND NAME(S): Granix What should I tell my health care provider before I take this medicine? They need to know if you have any of these conditions: -bone scan or tests planned -kidney disease -sickle cell anemia -an unusual or allergic reaction to tbo-filgrastim, filgrastim, pegfilgrastim, other medicines, foods, dyes, or preservatives -pregnant or trying to get pregnant -breast-feeding How should I use this medicine? This medicine is for injection under the skin. If you get this medicine at home, you will be taught how to prepare and give this medicine. Refer to the Instructions for Use that come with your medication packaging. Use exactly as directed. Take your medicine at regular intervals. Do not take your medicine more often than directed. It is important that you put your used needles and syringes in a special sharps container. Do not put them in a trash can. If you do not have a sharps container, call your pharmacist or healthcare provider to get one. Talk to your pediatrician regarding the use of this medicine in children. Special care may be needed. Overdosage: If you think you have taken too much of this medicine contact a poison control center or emergency room at once. NOTE: This medicine is only for you. Do not share this medicine with others. What if I miss a dose? It is important not to miss your dose. Call your doctor or health care professional if you miss a  dose. What may interact with this medicine? This medicine may interact with the following medications: -medicines that may cause a release of neutrophils, such as lithium This list may not describe all possible interactions. Give your health care provider a list of all the medicines, herbs, non-prescription drugs, or dietary supplements you use. Also tell them if you smoke, drink alcohol, or use illegal drugs. Some items may interact with your medicine. What should I watch for while using this medicine? You may need blood work done while you are taking this medicine. What side effects may I notice from receiving this medicine? Side effects that you should report to your doctor or health care professional as soon as possible: -allergic reactions like skin rash, itching or hives, swelling of the face, lips, or tongue -blood in the urine -dark urine -dizziness -fast heartbeat -feeling faint -shortness of breath or breathing problems -signs and symptoms of infection like fever or chills; cough; or sore throat -signs and symptoms of kidney injury like trouble passing urine or change in the amount of urine -stomach or side pain, or pain at the shoulder -sweating -swelling of the legs, ankles, or abdomen -tiredness Side effects that usually do not require medical attention (report to your doctor or health care professional if they continue or are bothersome): -bone pain -headache -muscle pain -vomiting This list may not describe all possible side effects. Call your doctor for medical advice about side effects. You may report side effects to FDA at 1-800-FDA-1088. Where should I keep my medicine? Keep out of the reach of children. Store in a refrigerator between   2 and 8 degrees C (36 and 46 degrees F). Keep in carton to protect from light. Throw away this medicine if it is left out of the refrigerator for more than 5 consecutive days. Throw away any unused medicine after the expiration  date. NOTE: This sheet is a summary. It may not cover all possible information. If you have questions about this medicine, talk to your doctor, pharmacist, or health care provider.  2018 [employer]vier/Gold Standard (2015-11-14 19:07:04)  

## 2017-08-15 NOTE — Assessment & Plan Note (Signed)
She has acquired pancytopenia She needs growth factor support She does not need blood transfusion

## 2017-08-15 NOTE — Assessment & Plan Note (Signed)
She is rate controlled and on anticoagulation therapy Continue medical management

## 2017-08-15 NOTE — Assessment & Plan Note (Signed)
She has signs of poor circulation and venous congestion I am concerned about new onset of cellulitis I recommend antibiotic treatment for at least a week I have taken pictures today for monitoring I encouraged her daughter to call me next week for progress

## 2017-08-15 NOTE — Assessment & Plan Note (Signed)
She has poor oral intake and anorexia I encouraged nutritional supplement and gave her prescription for boost along with daily meals

## 2017-08-15 NOTE — Progress Notes (Signed)
[facility] OFFICE PROGRESS NOTE  Patient Care Team: Asencion Noble, MD as PCP - General (Internal Medicine)  SUMMARY OF ONCOLOGIC HISTORY:   Ovarian CA, right ([place])   02/18/2016 Tumor Marker    Patient's tumor was tested for the following markers: CA125 Results of the tumor marker test revealed 45      05/22/2016 Tumor Marker    Patient's tumor was tested for the following markers: CA125 Results of the tumor marker test revealed 53      05/22/2016 Imaging    Outside pelvic US showed 4.1 cm adnexa mass      06/24/2017 Imaging    Ct abdomen and pelvis:  1. Interim finding of moderate ascites within the abdomen and pelvis with additional finding of diffuse nodular infiltration of the omentum and anterior mesenteric fat, the appearance would be consistent with peritoneal carcinomatosis/metastatic disease. Increasing retroperitoneal and upper abdominal adenopathy. 2. Re- demonstrated 3.8 cm cyst in the right adnexa. Enlarging soft tissue density in the left adnexa now with possible cystic component posteriorly. In light of the above findings, concern is for ovarian neoplasm. Correlation with pelvic ultrasound recommended. 3. Small right-sided pleural effusion, new since prior study 4. Stable hypodense splenic lesions since 2017.       06/25/2017 Imaging    US pelvis: 2.9 cm simple appearing cyst in the right ovary. Left ovary grossly unremarkable. Large volume ascites in the pelvis      06/30/2017 - 07/01/2017 Hospital Admission    She was admitted for evaluation of abdominal pain and ascites      07/01/2017 Pathology Results    PERITONEAL/ASCITIC FLUID(SPECIMEN 1 OF 1 COLLECTED 07/01/17): - POORLY DIFFERENTIATED CARCINOMA; SEE COMMENT Source Peritoneal/Ascitic Fluid, (specimen 1 of 1 collected 07/01/17) Gross Specimen: Received is/are 1000 cc's of brownish fluid. (BS:bs) Prepared: # Smears: 0 # Concentration Technique Slides (i.e. ThinPrep): 1 # Cell Block:  1 Additional Studies: Also received Hematology slide - [id number]. Comment The tumor cells are positive for cytokeratin 7 and Pax-8 but negative for cytokeratin 20, CDX-2, GATA-3, Napsin-A and TTF-1. Based on the immunoprofile a gynecology primary is favored      07/01/2017 Procedure    Successful ultrasound-guided diagnostic and therapeutic paracentesis yielding 2.5 liters of peritoneal fluid      07/07/2017 - 07/09/2017 Hospital Admission    She was admitted for management of malignant ascites      07/08/2017 Procedure    Successful ultrasound-guided therapeutic paracentesis yielding 2.7 liters liters of peritoneal fluid      07/12/2017 Procedure    Successful ultrasound-guided paracentesis yielding 1450 mL of peritoneal fluid      07/18/2017 - 07/24/2017 Hospital Admission    She was admitted for expedited treatment      07/18/2017 Tumor Marker    Patient's tumor was tested for the following markers: CA125 Results of the tumor marker test revealed 1941      07/19/2017 -  Chemotherapy    The patient had palonosetron (ALOXI) injection 0.25 mg, 0.25 mg, Intravenous,  Once, 1 of 6 cycles  pegfilgrastim (NEULASTA) injection 6 mg, 6 mg, Subcutaneous,  Once, 1 of 6 cycles  CARBOplatin (PARAPLATIN) 490 mg in sodium chloride 0.9 % 250 mL chemo infusion, 490 mg (100 % of original dose 487.2 mg), Intravenous,  Once, 1 of 6 cycles Dose modification:   (original dose 487.2 mg, Cycle 1)  PACLitaxel (TAXOL) 336 mg in dextrose 5 % 500 mL chemo infusion (> 14m/m2), 175 mg/m2 = 336 mg, Intravenous,  Once, 1 of 6 cycles  fosaprepitant (EMEND) 150 mg, dexamethasone (DECADRON) 12 mg in sodium chloride 0.9 % 145 mL IVPB, , Intravenous,  Once, 1 of 6 cycles  for chemotherapy treatment.        08/06/2017 Procedure    Successful ultrasound-guided therapeutic paracentesis yielding 2.6 liters of peritoneal fluid.      08/09/2017 Tumor Marker    Patient's tumor was tested for the following markers:  CA125 Results of the tumor marker test revealed 1665       INTERVAL HISTORY: Please see below for problem oriented charting. She returns with her daughter for further follow-up Since her recent paracentesis, she denies recurrence of abdominal distention She was started on short course of prednisone therapy last week for COPD exacerbation She was seen by pulmonologist who felt that there is a component of congestive heart failure contributing to shortness of breath in which she was put on diuretic therapy She tolerated Lasix well except for hypokalemia She denies significant urine output despite high-dose Lasix Shortness of breath is stable She denies cough Her appetite is poor She denies nausea or vomiting Her daughter felt that that is a component of anxiety contributing to sensation of shortness of breath Her daughter is concerned with bilateral skin changes on the lower extremity with associated edema  REVIEW OF SYSTEMS:   Constitutional: Denies fevers, chills or abnormal weight loss Eyes: Denies blurriness of vision Ears, nose, mouth, throat, and face: Denies mucositis or sore throat Cardiovascular: Denies palpitation, chest discomfort  Gastrointestinal:  Denies nausea, heartburn or change in bowel habits Lymphatics: Denies new lymphadenopathy  Neurological:Denies numbness, tingling or new weaknesses Behavioral/Psych: Mood is stable, no new changes  All other systems were reviewed with the patient and are negative.  I have reviewed the past medical history, past surgical history, social history and family history with the patient and they are unchanged from previous note.  ALLERGIES:  is allergic to codeine.  MEDICATIONS:  Current Outpatient Medications  Medication Sig Dispense Refill  . albuterol (PROAIR HFA) 108 (90 Base) MCG/ACT inhaler Inhale 2 puffs into the lungs every 6 (six) hours as needed for wheezing or shortness of breath. 1 Inhaler 3  . ALPRAZolam (XANAX) 0.25  MG tablet Take 1 tablet (0.25 mg total) by mouth at bedtime as needed for anxiety. 30 tablet 0  . azelastine (ASTELIN) 0.1 % nasal spray Place 2 sprays into both nostrils 2 (two) times daily. Use in each nostril as directed    . budesonide-formoterol (SYMBICORT) 160-4.5 MCG/ACT inhaler Inhale 2 puffs into the lungs 2 (two) times daily. 1 Inhaler 3  . cephALEXin (KEFLEX) 500 MG capsule Take 1 capsule (500 mg total) 3 (three) times daily by mouth. 21 capsule 0  . Cholecalciferol (VITAMIN D) 2000 units CAPS Take 2,000 Units by mouth daily.    Marland Kitchen dexamethasone (DECADRON) 4 MG tablet Take 5 tablets at 9 pm the day before chemo and to take 5 tabs at 6 am the day of chemo, every 3 weeks 30 tablet 1  . diltiazem (CARTIA XT) 120 MG 24 hr capsule TAKE 1 CAPSULE BY MOUTH EVERY DAY 90 capsule 3  . diphenoxylate-atropine (LOMOTIL) 2.5-0.025 MG tablet Take 1 tablet by mouth 4 (four) times daily. (Patient taking differently: Take 1 tablet by mouth 4 (four) times daily as needed for diarrhea or loose stools. ) 360 tablet 1  . fluticasone (FLONASE) 50 MCG/ACT nasal spray Place 2 sprays into both nostrils 2 (two) times daily. For nasal congestion    .  furosemide (LASIX) 40 MG tablet Take 1 tab twice daily take at 8am and 2pm. 60 tablet 5  . levothyroxine (SYNTHROID, LEVOTHROID) 137 MCG tablet Take 137 mcg by mouth every morning. For thyroid therapy    . lidocaine-prilocaine (EMLA) cream Apply 1 application topically as needed. 30 g 6  . loperamide (IMODIUM) 2 MG capsule Take 2 capsules (4 mg total) by mouth 3 (three) times daily. 30 capsule 0  . MAGNESIUM GLYCINATE PLUS PO Take 400 mg by mouth daily.    . methylcellulose (ARTIFICIAL TEARS) 1 % ophthalmic solution Place 1 drop into both eyes 2 (two) times daily as needed. Dry eyes    . potassium chloride SA (K-DUR,KLOR-CON) 20 MEQ tablet Take 1 tablet (20 mEq total) 2 (two) times daily by mouth. 30 tablet 0  . prochlorperazine (COMPAZINE) 10 MG tablet Take 10 mg by  mouth every 6 (six) hours as needed for nausea.  1  . senna-docusate (SENOKOT-S) 8.6-50 MG tablet Take 1 tablet by mouth at bedtime. 30 tablet 0  . XARELTO 20 MG TABS tablet TAKE 1 TABLET BY MOUTH EVERY DAY WITH SUPPER 90 tablet 0   No current facility-administered medications for this visit.     PHYSICAL EXAMINATION: ECOG PERFORMANCE STATUS: 2 - Symptomatic, <50% confined to bed  Vitals:   08/15/17 1026  BP: 125/60  Pulse: (!) 102  Resp: 20  Temp: 98.5 F (36.9 C)  SpO2: 100%   Filed Weights   08/15/17 1026  Weight: 179 lb (81.2 kg)    GENERAL:alert, no distress and comfortable SKIN: Noted bilateral skin changes suspicious for early cellulitis EYES: normal, Conjunctiva are pink and non-injected, sclera clear OROPHARYNX:no exudate, no erythema and lips, buccal mucosa, and tongue normal  NECK: supple, thyroid normal size, non-tender, without nodularity LYMPH:  no palpable lymphadenopathy in the cervical, axillary or inguinal LUNGS: clear to auscultation and percussion with normal breathing effort.  Her breathing effort is less compared to last visit HEART: regular rate & rhythm and no murmurs with moderate bilateral lower extremity edema ABDOMEN:abdomen soft, non-tender and normal bowel sounds Musculoskeletal:no cyanosis of digits and no clubbing  NEURO: alert & oriented x 3 with fluent speech, no focal motor/sensory deficits  LABORATORY DATA:  I have reviewed the data as listed    Component Value Date/Time   NA 134 (L) 08/15/2017 0935   K 2.6 (LL) 08/15/2017 0935   CL 94 (L) 08/12/2017 1137   CO2 33 (H) 08/15/2017 0935   GLUCOSE 115 08/15/2017 0935   BUN 9.1 08/15/2017 0935   CREATININE 0.7 08/15/2017 0935   CALCIUM 7.7 (L) 08/15/2017 0935   PROT 6.1 (L) 08/15/2017 0935   ALBUMIN 2.6 (L) 08/15/2017 0935   AST 25 08/15/2017 0935   ALT 25 08/15/2017 0935   ALKPHOS 48 08/15/2017 0935   BILITOT 0.85 08/15/2017 0935   GFRNONAA >60 07/23/2017 0536   GFRAA >60  07/23/2017 0536    No results found for: SPEP, UPEP  Lab Results  Component Value Date   WBC 2.7 (L) 08/15/2017   NEUTROABS 2.1 08/15/2017   HGB 11.3 (L) 08/15/2017   HCT 35.8 08/15/2017   MCV 90.4 08/15/2017   PLT 208 08/15/2017      Chemistry      Component Value Date/Time   NA 134 (L) 08/15/2017 0935   K 2.6 (LL) 08/15/2017 0935   CL 94 (L) 08/12/2017 1137   CO2 33 (H) 08/15/2017 0935   BUN 9.1 08/15/2017 0935   CREATININE 0.7  08/15/2017 0935      Component Value Date/Time   CALCIUM 7.7 (L) 08/15/2017 0935   ALKPHOS 48 08/15/2017 0935   AST 25 08/15/2017 0935   ALT 25 08/15/2017 0935   BILITOT 0.85 08/15/2017 0935         RADIOGRAPHIC STUDIES: I have personally reviewed the radiological images as listed and agreed with the findings in the report. Dg Chest 2 View  Result Date: 08/12/2017 CLINICAL DATA:  Shortness of Breath EXAM: CHEST  2 VIEW COMPARISON:  July 21, 2017 FINDINGS: Pleural effusions bilaterally are significantly smaller. Small pleural effusions remain with bibasilar atelectasis present. Lungs elsewhere clear. Heart size and pulmonary vascularity are normal. There is aortic atherosclerosis. No adenopathy. Port-A-Cath tip is at the cavoatrial junction. No pneumothorax. There is degenerative change in the lower thoracic spine. Bones are osteoporotic. IMPRESSION: Small pleural effusions remain with bibasilar atelectasis. Pleural effusions are considerably smaller compared to most recent prior study. Lungs elsewhere clear. Heart size within normal limits. There is aortic atherosclerosis. Bones osteoporotic. Aortic Atherosclerosis (ICD10-I70.0). Electronically Signed   By: Lowella Grip III M.D.   On: 08/12/2017 17:24   Dg Chest 2 View  Result Date: 07/21/2017 CLINICAL DATA:  Productive cough. History of ovarian carcinoma with current chemotherapy. COPD exacerbation. EXAM: CHEST  2 VIEW COMPARISON:  07/19/2017 FINDINGS: The heart size and mediastinal  contours are within normal limits. Increased opacity at the right lung base likely mostly relates to pleural fluid. However, there may be a component of pneumonia present. Small left pleural effusion present. The visualized skeletal structures are unremarkable. IMPRESSION: Increased opacity at the right lung base since the prior radiograph 2 days ago. This is felt to mostly relate to a moderate pleural effusion. However, component of pneumonia may be present. A small left pleural effusion remains present. Electronically Signed   By: Aletta Edouard M.D.   On: 07/21/2017 12:28   Dg Chest 2 View  Result Date: 07/19/2017 CLINICAL DATA:  Cough EXAM: CHEST  2 VIEW COMPARISON:  07/07/2017 FINDINGS: Small bilateral pleural effusions. Right basilar atelectasis. Heart is normal size. No effusions or acute bony abnormality. IMPRESSION: Small effusions.  Right base atelectasis. Electronically Signed   By: Rolm Baptise M.D.   On: 07/19/2017 12:00   US Abdomen Limited  Result Date: 07/19/2017 CLINICAL DATA:  83 year old female with suspected ascites. Paracentesis is requested. EXAM: LIMITED ABDOMEN ULTRASOUND FOR ASCITES TECHNIQUE: Limited ultrasound survey for ascites was performed in all four abdominal quadrants. COMPARISON:  Paracentesis performed 07/08/2017 FINDINGS: Ultrasound interrogation of the abdomen demonstrates trace ascites. There is insufficient fluid for safe paracentesis at this time. IMPRESSION: Trace ascites.  Paracentesis was deferred. Electronically Signed   By: Jacqulynn Cadet M.D.   On: 07/19/2017 13:33   US Paracentesis  Result Date: 08/06/2017 INDICATION: Ovarian cancer with recurrent malignant ascites; request made for therapeutic paracentesis. EXAM: ULTRASOUND GUIDED THERAPEUTIC PARACENTESIS MEDICATIONS: None. COMPLICATIONS: None immediate. PROCEDURE: Informed written consent was obtained from the patient after a discussion of the risks, benefits and alternatives to treatment. A timeout  was performed prior to the initiation of the procedure. Initial ultrasound scanning demonstrates a small to moderate amount of ascites within the right lower abdominal quadrant. The right lower abdomen was prepped and draped in the usual sterile fashion. 2% lidocaine was used for local anesthesia. Following this, a Safe-T-Centesis catheter was introduced. An ultrasound image was saved for documentation purposes. The paracentesis was performed. The catheter was removed and a dressing was applied. The patient  tolerated the procedure well without immediate post procedural complication. FINDINGS: A total of approximately 2.6 liters of chylous/milky colored fluid was removed. IMPRESSION: Successful ultrasound-guided therapeutic paracentesis yielding 2.6 liters of peritoneal fluid. Read by: Rowe Robert, PA-C Electronically Signed   By: Marybelle Killings M.D.   On: 08/06/2017 12:43   Ir US Guide Vasc Access Right  Result Date: 07/22/2017 INDICATION: History of ovarian cancer, in need of durable intravenous access for chemotherapy administration. EXAM: IMPLANTED PORT A CATH PLACEMENT WITH ULTRASOUND AND FLUOROSCOPIC GUIDANCE COMPARISON:  None. MEDICATIONS: Ancef 2 gm IV; The antibiotic was administered within an appropriate time interval prior to skin puncture. ANESTHESIA/SEDATION: Moderate (conscious) sedation was employed during this procedure. A total of Versed 2 mg and Fentanyl 100 mcg was administered intravenously. Moderate Sedation Time: 21 minutes. The patient's level of consciousness and vital signs were monitored continuously by radiology nursing throughout the procedure under my direct supervision. CONTRAST:  None FLUOROSCOPY TIME:  18 seconds (9 mGy) COMPLICATIONS: None immediate. PROCEDURE: The procedure, risks, benefits, and alternatives were explained to the patient. Questions regarding the procedure were encouraged and answered. The patient understands and consents to the procedure. The right neck and chest  were prepped with chlorhexidine in a sterile fashion, and a sterile drape was applied covering the operative field. Maximum barrier sterile technique with sterile gowns and gloves were used for the procedure. A timeout was performed prior to the initiation of the procedure. Local anesthesia was provided with 1% lidocaine with epinephrine. After creating a small venotomy incision, a micropuncture kit was utilized to access the internal jugular vein. Real-time ultrasound guidance was utilized for vascular access including the acquisition of a permanent ultrasound image documenting patency of the accessed vessel. The microwire was utilized to measure appropriate catheter length. A subcutaneous port pocket was then created along the upper chest wall utilizing a combination of sharp and blunt dissection. The pocket was irrigated with sterile saline. A single lumen ISP power injectable port was chosen for placement. The 8 Fr catheter was tunneled from the port pocket site to the venotomy incision. The port was placed in the pocket. The external catheter was trimmed to appropriate length. At the venotomy, an 8 Fr peel-away sheath was placed over a guidewire under fluoroscopic guidance. The catheter was then placed through the sheath and the sheath was removed. Final catheter positioning was confirmed and documented with a fluoroscopic spot radiograph. The port was accessed with a Huber needle, aspirated and flushed. The venotomy site was closed with an interrupted 4-0 Vicryl suture. The port pocket incision was closed with interrupted 2-0 Vicryl suture and the skin was opposed with a running subcuticular 4-0 Vicryl suture. Dermabond and Steri-strips were applied to both incisions. The Port a Catheter was left accessed for in-hospital utilization. Dressings were placed. The patient tolerated the procedure well without immediate post procedural complication. FINDINGS: After catheter placement, the tip lies within the  superior cavoatrial junction. The catheter aspirates and flushes normally and is ready for immediate use. IMPRESSION: Successful placement of a right internal jugular approach power injectable Port-A-Cath. The Port a Catheter was left accessed for potential immediate in-hospital utilization. Electronically Signed   By: Sandi Mariscal M.D.   On: 07/22/2017 14:05   Ir Fluoro Guide Port Insertion Right  Result Date: 07/22/2017 INDICATION: History of ovarian cancer, in need of durable intravenous access for chemotherapy administration. EXAM: IMPLANTED PORT A CATH PLACEMENT WITH ULTRASOUND AND FLUOROSCOPIC GUIDANCE COMPARISON:  None. MEDICATIONS: Ancef 2 gm IV; The  antibiotic was administered within an appropriate time interval prior to skin puncture. ANESTHESIA/SEDATION: Moderate (conscious) sedation was employed during this procedure. A total of Versed 2 mg and Fentanyl 100 mcg was administered intravenously. Moderate Sedation Time: 21 minutes. The patient's level of consciousness and vital signs were monitored continuously by radiology nursing throughout the procedure under my direct supervision. CONTRAST:  None FLUOROSCOPY TIME:  18 seconds (9 mGy) COMPLICATIONS: None immediate. PROCEDURE: The procedure, risks, benefits, and alternatives were explained to the patient. Questions regarding the procedure were encouraged and answered. The patient understands and consents to the procedure. The right neck and chest were prepped with chlorhexidine in a sterile fashion, and a sterile drape was applied covering the operative field. Maximum barrier sterile technique with sterile gowns and gloves were used for the procedure. A timeout was performed prior to the initiation of the procedure. Local anesthesia was provided with 1% lidocaine with epinephrine. After creating a small venotomy incision, a micropuncture kit was utilized to access the internal jugular vein. Real-time ultrasound guidance was utilized for vascular access  including the acquisition of a permanent ultrasound image documenting patency of the accessed vessel. The microwire was utilized to measure appropriate catheter length. A subcutaneous port pocket was then created along the upper chest wall utilizing a combination of sharp and blunt dissection. The pocket was irrigated with sterile saline. A single lumen ISP power injectable port was chosen for placement. The 8 Fr catheter was tunneled from the port pocket site to the venotomy incision. The port was placed in the pocket. The external catheter was trimmed to appropriate length. At the venotomy, an 8 Fr peel-away sheath was placed over a guidewire under fluoroscopic guidance. The catheter was then placed through the sheath and the sheath was removed. Final catheter positioning was confirmed and documented with a fluoroscopic spot radiograph. The port was accessed with a Huber needle, aspirated and flushed. The venotomy site was closed with an interrupted 4-0 Vicryl suture. The port pocket incision was closed with interrupted 2-0 Vicryl suture and the skin was opposed with a running subcuticular 4-0 Vicryl suture. Dermabond and Steri-strips were applied to both incisions. The Port a Catheter was left accessed for in-hospital utilization. Dressings were placed. The patient tolerated the procedure well without immediate post procedural complication. FINDINGS: After catheter placement, the tip lies within the superior cavoatrial junction. The catheter aspirates and flushes normally and is ready for immediate use. IMPRESSION: Successful placement of a right internal jugular approach power injectable Port-A-Cath. The Port a Catheter was left accessed for potential immediate in-hospital utilization. Electronically Signed   By: Sandi Mariscal M.D.   On: 07/22/2017 14:05    ASSESSMENT & PLAN:  Ovarian CA, right ([place]) The patient has positive response to treatment However, she is frail with multiple comorbidities Both the  patient and her daughter are very sad, ruminating on the fact that she was diagnosed at stage III disease She was upset that her surveillance screening was discontinued prior to the diagnosis of ovarian cancer At this point, I recommend we continue aggressive supportive care and move forward with treatment She will receive G-CSF support today and tomorrow  I have encouraged her daughter to call me next week for update on her progress I will see her again with the following week for supportive care  Pancytopenia, acquired [facility]) She has acquired pancytopenia She needs growth factor support She does not need blood transfusion  Bilateral leg edema She has significant leg edema due to low  protein status, poor mobility and evidence of fluid overload She was seen by pulmonologist recently ProBNP level was high and she was started on Lasix therapy I recommend she continues to same I also recommend referring her back to cardiologist for further management and she agreed  Atrial fibrillation ([place]) She is rate controlled and on anticoagulation therapy Continue medical management  Bilateral cellulitis of lower leg She has signs of poor circulation and venous congestion I am concerned about new onset of cellulitis I recommend antibiotic treatment for at least a week I have taken pictures today for monitoring I encouraged her daughter to call me next week for progress  COPD (chronic obstructive pulmonary disease) ([place]) She has chronic COPD, oxygen dependent She willfollow closely with pulmonologist for further management  Protein-calorie malnutrition, moderate ([place]) She has poor oral intake and anorexia I encouraged nutritional supplement and gave her prescription for boost along with daily meals   Depression She has mild anxiety & depression due to her clinical situation I gave her prescription, per patient request to increase frequency of Xanax to take as needed for anxiety, that has  contributed to her sensation of shortness of breath I warned her about risk of sedation and falls  Drug-induced hypokalemia I gave her potassium replacement therapy   No orders of the defined types were placed in this encounter.  All questions were answered. The patient knows to call the clinic with any problems, questions or concerns. No barriers to learning was detected. I spent 40 minutes counseling the patient face to face. The total time spent in the appointment was 55 minutes and more than 50% was on counseling and review of test results     Heath Lark, MD 08/15/2017 2:08 PM

## 2017-08-15 NOTE — Assessment & Plan Note (Signed)
I gave her potassium replacement therapy

## 2017-08-15 NOTE — Assessment & Plan Note (Signed)
She has mild anxiety & depression due to her clinical situation I gave her prescription, per patient request to increase frequency of Xanax to take as needed for anxiety, that has contributed to her sensation of shortness of breath I warned her about risk of sedation and falls

## 2017-08-15 NOTE — Progress Notes (Signed)
Met w/ pt to introduce myself as her Financial Resource Specialist.  Pt has 2 insurances so copay assistance isn't needed.  I informed her of the [facility] and [employer] grants, went over what they cover and gave her the income requirement.  Unfortunately pt is over qualified for those grants but I gave her an application for the [employer].  She will bring the application along with the required docs needed for processing to me and I will email it to Jennifer.  She has my card for any questions or concerns she may have in the future.  °

## 2017-08-15 NOTE — Telephone Encounter (Signed)
Patient Instructions    COPD: At this time it is not clear to me that you are having an exacerbation, perhaps the Decadron helped Continue taking Symbicort 2 puffs twice a day I would not take the prednisone this week  Shortness of breath: I think this is directly related to all the extra fluid you are holding: I do worry about a pleural effusion so we will get a chest x-ray today. Take Lasix 40 mg twice a day, 8 AM and 2 PM We will check lab work today to see which her kidney function and brain natruretic peptide levels are doing, depending on these results she may need to have an echocardiogram  We will see you back later this week, Thursday afternoon    Notes recorded by Juanito Doom, MD on 08/14/2017 at 5:24 PM EST A, Please let the patient know these were abnormal, suggestive of heart failure so I want her to have an echocardiogram for leg swelling and dyspnea. Thanks, B   Spoke with pt's daughter [name], who wants to know if pt should still see TP tomorrow, as pt is aware of lab results. [name] states pt's symptoms are baseline.    BQ please advise. Thanks.

## 2017-08-15 NOTE — Telephone Encounter (Signed)
Called office to get appt to see cardiologist, has seen Dr, Aundra Dubin in the past. Dr. Aundra Dubin no longer in the office.  Office will call patients daughter and give appt for Monday at 2:20.

## 2017-08-16 ENCOUNTER — Ambulatory Visit: Payer: Self-pay | Admitting: Adult Health

## 2017-08-16 ENCOUNTER — Ambulatory Visit (HOSPITAL_BASED_OUTPATIENT_CLINIC_OR_DEPARTMENT_OTHER): Payer: Medicare Other

## 2017-08-16 DIAGNOSIS — Z5189 Encounter for other specified aftercare: Secondary | ICD-10-CM | POA: Diagnosis not present

## 2017-08-16 DIAGNOSIS — C561 Malignant neoplasm of right ovary: Secondary | ICD-10-CM

## 2017-08-16 LAB — CA 125: CANCER ANTIGEN (CA) 125: 937.9 U/mL — AB (ref 0.0–38.1)

## 2017-08-16 MED ORDER — TBO-FILGRASTIM 300 MCG/0.5ML ~~LOC~~ SOSY
300.0000 ug | PREFILLED_SYRINGE | Freq: Once | SUBCUTANEOUS | Status: AC
  Administered 2017-08-16: 300 ug via SUBCUTANEOUS
  Filled 2017-08-16: qty 0.5

## 2017-08-16 NOTE — Patient Instructions (Signed)
Tbo-Filgrastim injection What is this medicine? TBO-FILGRASTIM (T B O fil GRA stim) is a granulocyte colony-stimulating factor that stimulates the growth of neutrophils, a type of white blood cell important in the body's fight against infection. It is used to reduce the incidence of fever and infection in patients with certain types of cancer who are receiving chemotherapy that affects the bone marrow. This medicine may be used for other purposes; ask your health care provider or pharmacist if you have questions. COMMON BRAND NAME(S): Granix What should I tell my health care provider before I take this medicine? They need to know if you have any of these conditions: -bone scan or tests planned -kidney disease -sickle cell anemia -an unusual or allergic reaction to tbo-filgrastim, filgrastim, pegfilgrastim, other medicines, foods, dyes, or preservatives -pregnant or trying to get pregnant -breast-feeding How should I use this medicine? This medicine is for injection under the skin. If you get this medicine at home, you will be taught how to prepare and give this medicine. Refer to the Instructions for Use that come with your medication packaging. Use exactly as directed. Take your medicine at regular intervals. Do not take your medicine more often than directed. It is important that you put your used needles and syringes in a special sharps container. Do not put them in a trash can. If you do not have a sharps container, call your pharmacist or healthcare provider to get one. Talk to your pediatrician regarding the use of this medicine in children. Special care may be needed. Overdosage: If you think you have taken too much of this medicine contact a poison control center or emergency room at once. NOTE: This medicine is only for you. Do not share this medicine with others. What if I miss a dose? It is important not to miss your dose. Call your doctor or health care professional if you miss a  dose. What may interact with this medicine? This medicine may interact with the following medications: -medicines that may cause a release of neutrophils, such as lithium This list may not describe all possible interactions. Give your health care provider a list of all the medicines, herbs, non-prescription drugs, or dietary supplements you use. Also tell them if you smoke, drink alcohol, or use illegal drugs. Some items may interact with your medicine. What should I watch for while using this medicine? You may need blood work done while you are taking this medicine. What side effects may I notice from receiving this medicine? Side effects that you should report to your doctor or health care professional as soon as possible: -allergic reactions like skin rash, itching or hives, swelling of the face, lips, or tongue -blood in the urine -dark urine -dizziness -fast heartbeat -feeling faint -shortness of breath or breathing problems -signs and symptoms of infection like fever or chills; cough; or sore throat -signs and symptoms of kidney injury like trouble passing urine or change in the amount of urine -stomach or side pain, or pain at the shoulder -sweating -swelling of the legs, ankles, or abdomen -tiredness Side effects that usually do not require medical attention (report to your doctor or health care professional if they continue or are bothersome): -bone pain -headache -muscle pain -vomiting This list may not describe all possible side effects. Call your doctor for medical advice about side effects. You may report side effects to FDA at 1-800-FDA-1088. Where should I keep my medicine? Keep out of the reach of children. Store in a refrigerator between   2 and 8 degrees C (36 and 46 degrees F). Keep in carton to protect from light. Throw away this medicine if it is left out of the refrigerator for more than 5 consecutive days. Throw away any unused medicine after the expiration  date. NOTE: This sheet is a summary. It may not cover all possible information. If you have questions about this medicine, talk to your doctor, pharmacist, or health care provider.  2018 [employer]vier/Gold Standard (2015-11-14 19:07:04)  

## 2017-08-19 ENCOUNTER — Ambulatory Visit (INDEPENDENT_AMBULATORY_CARE_PROVIDER_SITE_OTHER): Payer: Medicare Other | Admitting: Internal Medicine

## 2017-08-19 ENCOUNTER — Encounter: Payer: Self-pay | Admitting: Internal Medicine

## 2017-08-19 VITALS — BP 120/64 | HR 104 | Ht 64.0 in | Wt 182.0 lb

## 2017-08-19 DIAGNOSIS — I480 Paroxysmal atrial fibrillation: Secondary | ICD-10-CM | POA: Diagnosis not present

## 2017-08-19 DIAGNOSIS — I5033 Acute on chronic diastolic (congestive) heart failure: Secondary | ICD-10-CM

## 2017-08-19 NOTE — Progress Notes (Signed)
Follow-up Outpatient Visit Date: 08/19/2017  Primary Care Provider: Asencion Noble, MD [address] [place] [address]  Chief Complaint: Heart failure  HPI:  [name] is a 83 y.o. year-old female with history of proximal atrial fibrillation, chronic diastolic heart failure, peripheral vascular disease, and recently diagnosed ovarian cancer (currently undergoing chemotherapy), who presents for follow-up of atrial fibrillation. She was last seen in our practice by Dr. Domenic Polite in September. At that time, she was doing well. She now resides in a nursing home in [place] due to deconditioning and is unable to travel to [place] to follow-up with Dr. Domenic Polite.  Today, [name] is concerned about worsening shortness of breath and leg swelling The symptoms first began about a month ago but have become significantly worse over the last 2 weeks. Labs drawn by Dr Pennie Banter 1-2 weeks ago were notable for an elevated NT-proBNP, leading to a diagnosis of congestive heart failure. About a week ago, [name]'s furosemide was also increased from 20 mg daily to 40 mg BID. She has noticed improvement in her leg swelling, though considerable edema remains. Her shortness of breath and fatigue are stable.She denies chest pain, palpitations, and lightheadedness.  [name] has completed 2 cycles of chemotherapy She remains compliant with her medications, including diltiazem and rivaroxaban. She has not experienced any bleeding. She is currently on a course of cephalexin for possible bilateral calf cellulitis. She occasionally adds salt to her food and is not sure about the salt content of the meals that she has been receiving at her rehab facility.  --------------------------------------------------------------------------------------------------  Past Medical History:  Diagnosis Date  . COPD (chronic obstructive pulmonary disease) ([facility])   . Depression   . Diverticulosis   . Family hx of colon  cancer   . Fibromyalgia   . GERD (gastroesophageal reflux disease)   . Hemorrhoids   . Hiatal hernia   . History of shingles   . Hypothyroidism   . IBS (irritable bowel syndrome)   . Lymphocytic colitis   . Osteoporosis   . Ovarian cancer ([facility])   . Paroxysmal atrial fibrillation ([facility])   . Psoriatic arthritis ([facility])   . Rectal polyp   . Schatzki's ring   . Uterine polyp    Past Surgical History:  Procedure Laterality Date  . CHOLECYSTECTOMY  Dec 04, 2010  . COLONOSCOPY    . FRACTURE SURGERY Left 12/11/2013   hip  . IR FLUORO GUIDE PORT INSERTION RIGHT  07/22/2017  . IR PARACENTESIS  07/12/2017  . IR US GUIDE VASC ACCESS RIGHT  07/22/2017  . KNEE SURGERY Left    x 2  . Uterine polypectomy      Current Meds  Medication Sig  . albuterol (PROAIR HFA) 108 (90 Base) MCG/ACT inhaler Inhale 2 puffs into the lungs every 6 (six) hours as needed for wheezing or shortness of breath.  . ALPRAZolam (XANAX) 0.25 MG tablet Take 1 tablet (0.25 mg total) by mouth at bedtime as needed for anxiety.  Marland Kitchen azelastine (ASTELIN) 0.1 % nasal spray Place 2 sprays into both nostrils 2 (two) times daily. Use in each nostril as directed  . budesonide-formoterol (SYMBICORT) 160-4.5 MCG/ACT inhaler Inhale 2 puffs into the lungs 2 (two) times daily.  . cephALEXin (KEFLEX) 500 MG capsule Take 1 capsule (500 mg total) 3 (three) times daily by mouth.  . Cholecalciferol (VITAMIN D) 2000 units CAPS Take 2,000 Units by mouth daily.  Marland Kitchen dexamethasone (DECADRON) 4 MG tablet Take 5 tablets at 9  pm the day before chemo and to take 5 tabs at 6 am the day of chemo, every 3 weeks  . diltiazem (CARTIA XT) 120 MG 24 hr capsule TAKE 1 CAPSULE BY MOUTH EVERY DAY  . diphenoxylate-atropine (LOMOTIL) 2.5-0.025 MG tablet Take 1 tablet by mouth 4 (four) times daily. (Patient taking differently: Take 1 tablet by mouth 4 (four) times daily as needed for diarrhea or loose stools. )  . fluticasone (FLONASE) 50 MCG/ACT nasal spray Place 2  sprays into both nostrils 2 (two) times daily. For nasal congestion  . furosemide (LASIX) 40 MG tablet Take 1 tab twice daily take at 8am and 2pm.  . levothyroxine (SYNTHROID, LEVOTHROID) 137 MCG tablet Take 137 mcg by mouth every morning. For thyroid therapy  . lidocaine-prilocaine (EMLA) cream Apply 1 application topically as needed.  . loperamide (IMODIUM) 2 MG capsule Take 2 capsules (4 mg total) by mouth 3 (three) times daily.  Marland Kitchen MAGNESIUM GLYCINATE PLUS PO Take 400 mg by mouth daily.  . methylcellulose (ARTIFICIAL TEARS) 1 % ophthalmic solution Place 1 drop into both eyes 2 (two) times daily as needed. Dry eyes  . potassium chloride SA (K-DUR,KLOR-CON) 20 MEQ tablet Take 1 tablet (20 mEq total) 2 (two) times daily by mouth.  . prochlorperazine (COMPAZINE) 10 MG tablet Take 10 mg by mouth every 6 (six) hours as needed for nausea.  Marland Kitchen senna-docusate (SENOKOT-S) 8.6-50 MG tablet Take 1 tablet by mouth at bedtime.  Alveda Reasons 20 MG TABS tablet TAKE 1 TABLET BY MOUTH EVERY DAY WITH SUPPER    Allergies: Codeine  Social History   Socioeconomic History  . Marital status: Divorced    Spouse name: Not on file  . Number of children: 1  . Years of education: 73  . Highest education level: Not on file  Social Needs  . Financial resource strain: Not on file  . Food insecurity - worry: Not on file  . Food insecurity - inability: Not on file  . Transportation needs - medical: Not on file  . Transportation needs - non-medical: Not on file  Occupational History  . Occupation: retired    Fish farm manager: RETIRED  Tobacco Use  . Smoking status: Former Smoker    Packs/day: 1.00    Years: 20.00    Pack years: 20.00    Types: Cigarettes    Last attempt to quit: 10/08/1980    Years since quitting: 36.8  . Smokeless tobacco: Never Used  Substance and Sexual Activity  . Alcohol use: Yes    Comment: rarely, 12-23-15 rarely  . Drug use: No  . Sexual activity: Not Currently  Other Topics Concern  . Not  on file  Social History Narrative   Patient lives at home by herself.    Patient is retired.    Patient has 12 th grade education.           Family History  Problem Relation Age of Onset  . Heart disease Father        pacemaker  . Lung cancer Father        hx of smoker  . Colon cancer Mother        dx in her mid 37's  . Colon cancer Maternal Grandfather   . Depression Sister     Review of Systems: Review of Systems  Constitutional: Positive for malaise/fatigue.  HENT: Negative.   Eyes: Negative.   Respiratory: Positive for cough, shortness of breath and wheezing.   Cardiovascular: Positive for orthopnea and  leg swelling. Negative for chest pain and palpitations.  Gastrointestinal: Positive for abdominal pain.  Genitourinary: Negative.   Musculoskeletal: Positive for myalgias.  Skin: Positive for rash.  Neurological: Negative.   Endo/Heme/Allergies: Bruises/bleeds easily.  Psychiatric/Behavioral: Negative.    --------------------------------------------------------------------------------------------------  Physical Exam: BP 120/64   Pulse (!) 104   Ht 5' 4"  (1.626 m)   Wt 182 lb (82.6 kg)   SpO2 92%   BMI 31.24 kg/m   General:  Obese, chronically ill appearing woman, seated in a wheelchair. She is accompanied by her daughte HEENT: Pale conjunctiva noted without scleral icterus. Moist mucous membranes.  OP clear. Neck: Supple without lymphadenopathy or thyromegaly. JVP ~8 cm with positive HJR. Lungs: Normal work of breathing. Mild diminished breath sounds throughout without wheezes or crackles. Heart: Regular rate and rhythm without murmurs, rubs, or gallops. Non-displaced PMI. Abd: Bowel sounds present. Soft, NT/ND without hepatosplenomegaly Ext: 2-3+ pitting edema to the proximal calves  bilaterally Skin: Warm and dry. Erythema noted in distal half of both calves  EKG:  Normal sinus rhythm (HR 100 bpm) with rightward axis and low voltage.  Lab Results    Component Value Date   WBC 2.7 (L) 08/15/2017   HGB 11.3 (L) 08/15/2017   HCT 35.8 08/15/2017   MCV 90.4 08/15/2017   PLT 208 08/15/2017    Lab Results  Component Value Date   NA 134 (L) 08/15/2017   K 2.6 (LL) 08/15/2017   CL 94 (L) 08/12/2017   CO2 33 (H) 08/15/2017   BUN 9.1 08/15/2017   CREATININE 0.7 08/15/2017   GLUCOSE 115 08/15/2017   ALT 25 08/15/2017    No results found for: CHOL, HDL, LDLCALC, LDLDIRECT, TRIG, CHOLHDL  --------------------------------------------------------------------------------------------------  ASSESSMENT AND PLAN: Acute on chronic diastolic heart failure Ms. Simington is significant volume overloaded on exam today, consistent with her symptoms of heart failure. She previously had a normal LVEF, with repeat echo scheduled for tomorrow. Her weight has come down 9 pounds over the last 10 days. I therefore think it is reasonable to continue her current escalated dose of furosemide (40 mg BID). However, further uptitration may be necessary in the future. If her LVEF has declined, we will need to consider discontinuation of diltiazem and initiate evidence-based heart failure therapy (i.e. Beta-blocker, ACEI/ARB/ANRI, and aldosterone antagonist). I have a lows suspicion for DVT/PE causing the patient's shortness of breath and leg swelling, given bilateral nature of the edema and continued therapeutic anticoagulation with rivaroxaban due to paroxysmal atrial fibrillation.  Paroxysmal atrial fibrillation EKG today shows borderline sinus tachycardia. Ms. Ransom denies symptoms of atrial fibrillation. We will continue diltiazem for now as well as rivaroxaban for stroke prophylaxis.  Follow-up: Return to see me or APP in 3-4 weeks.  Nelva Bush, MD 08/19/2017 2:45 PM

## 2017-08-19 NOTE — Patient Instructions (Signed)
Medication Instructions:  Your physician recommends that you continue on your current medications as directed. Please refer to the Current Medication list given to you today.   Your physician recommends that you weigh, daily, at the same time every day, and in the same amount of clothing. Please record your daily weights.  If you gain more than 2 pounds in 24 hours or you gain more than 5 pounds in 1 week, take Lasix (furosemide) 80 mg in the morning and 40 mg in the afternoon that day only, then go back to your usual Lasix dose.    Labwork: None   Testing/Procedures: None  Follow-Up: Your physician recommends that you schedule a follow-up appointment in: 3-4 weeks with Dr End or and APP.     Any Other Special Instructions Will Be Listed Below (If Applicable).  Dr End recommends that you limit your sodium/salt intake to 3 grams (3000 mg ) per day.   If you need a refill on your cardiac medications before your next appointment, please call your pharmacy.

## 2017-08-20 ENCOUNTER — Ambulatory Visit (HOSPITAL_COMMUNITY): Payer: No Typology Code available for payment source | Attending: Cardiology

## 2017-08-20 ENCOUNTER — Telehealth: Payer: Self-pay | Admitting: *Deleted

## 2017-08-20 ENCOUNTER — Other Ambulatory Visit: Payer: Self-pay

## 2017-08-20 DIAGNOSIS — I5030 Unspecified diastolic (congestive) heart failure: Secondary | ICD-10-CM | POA: Diagnosis not present

## 2017-08-20 DIAGNOSIS — M7989 Other specified soft tissue disorders: Secondary | ICD-10-CM | POA: Diagnosis present

## 2017-08-20 DIAGNOSIS — R0600 Dyspnea, unspecified: Secondary | ICD-10-CM

## 2017-08-20 HISTORY — PX: TRANSTHORACIC ECHOCARDIOGRAM: SHX275

## 2017-08-20 NOTE — Telephone Encounter (Signed)
Thanks for the update

## 2017-08-20 NOTE — Telephone Encounter (Signed)
Daughter called to let Dr Alvy Bimler know [name] saw cardiologist yesterday. Swelling in legs has gone down, feet still swollen, continues to have SOB- but cardiologist was not concerned.

## 2017-08-27 ENCOUNTER — Other Ambulatory Visit (HOSPITAL_BASED_OUTPATIENT_CLINIC_OR_DEPARTMENT_OTHER): Payer: Medicare Other

## 2017-08-27 ENCOUNTER — Telehealth: Payer: Self-pay | Admitting: Hematology and Oncology

## 2017-08-27 ENCOUNTER — Ambulatory Visit (HOSPITAL_BASED_OUTPATIENT_CLINIC_OR_DEPARTMENT_OTHER): Payer: Medicare Other | Admitting: Hematology and Oncology

## 2017-08-27 ENCOUNTER — Encounter: Payer: Self-pay | Admitting: Hematology and Oncology

## 2017-08-27 VITALS — BP 124/65 | HR 96 | Temp 97.7°F | Resp 16 | Ht 64.0 in | Wt 173.5 lb

## 2017-08-27 DIAGNOSIS — R609 Edema, unspecified: Secondary | ICD-10-CM

## 2017-08-27 DIAGNOSIS — C561 Malignant neoplasm of right ovary: Secondary | ICD-10-CM

## 2017-08-27 DIAGNOSIS — R600 Localized edema: Secondary | ICD-10-CM

## 2017-08-27 DIAGNOSIS — D61818 Other pancytopenia: Secondary | ICD-10-CM | POA: Diagnosis not present

## 2017-08-27 DIAGNOSIS — F419 Anxiety disorder, unspecified: Secondary | ICD-10-CM | POA: Diagnosis not present

## 2017-08-27 DIAGNOSIS — E440 Moderate protein-calorie malnutrition: Secondary | ICD-10-CM

## 2017-08-27 LAB — COMPREHENSIVE METABOLIC PANEL
ALBUMIN: 2.9 g/dL — AB (ref 3.5–5.0)
ALK PHOS: 52 U/L (ref 40–150)
ALT: 20 U/L (ref 0–55)
ANION GAP: 8 meq/L (ref 3–11)
AST: 22 U/L (ref 5–34)
BUN: 12.1 mg/dL (ref 7.0–26.0)
CALCIUM: 8.2 mg/dL — AB (ref 8.4–10.4)
CHLORIDE: 97 meq/L — AB (ref 98–109)
CO2: 31 mEq/L — ABNORMAL HIGH (ref 22–29)
Creatinine: 0.8 mg/dL (ref 0.6–1.1)
EGFR: >60 mL/min/{1.73_m2} (ref >60)
Glucose: 126 mg/dl (ref 70–140)
POTASSIUM: 3.5 meq/L (ref 3.5–5.1)
Sodium: 135 mEq/L — ABNORMAL LOW (ref 136–145)
Total Bilirubin: 0.43 mg/dL (ref 0.20–1.20)
Total Protein: 6.6 g/dL (ref 6.4–8.3)

## 2017-08-27 LAB — CBC WITH DIFFERENTIAL/PLATELET
BASO%: 1.1 % (ref 0.0–2.0)
Basophils Absolute: 0.1 10*3/uL (ref 0.0–0.1)
EOS%: 0.5 % (ref 0.0–7.0)
Eosinophils Absolute: 0 10*3/uL (ref 0.0–0.5)
HCT: 34.2 % — ABNORMAL LOW (ref 34.8–46.6)
HGB: 10.9 g/dL — ABNORMAL LOW (ref 11.6–15.9)
LYMPH%: 23.2 % (ref 14.0–49.7)
MCH: 29.3 pg (ref 25.1–34.0)
MCHC: 31.9 g/dL (ref 31.5–36.0)
MCV: 91.9 fL (ref 79.5–101.0)
MONO#: 0.6 10*3/uL (ref 0.1–0.9)
MONO%: 11.3 % (ref 0.0–14.0)
NEUT#: 3.6 10*3/uL (ref 1.5–6.5)
NEUT%: 63.9 % (ref 38.4–76.8)
PLATELETS: 131 10*3/uL — AB (ref 145–400)
RBC: 3.72 10*6/uL (ref 3.70–5.45)
RDW: 16.9 % — ABNORMAL HIGH (ref 11.2–14.5)
WBC: 5.6 10*3/uL (ref 3.9–10.3)
lymph#: 1.3 10*3/uL (ref 0.9–3.3)

## 2017-08-27 LAB — TECHNOLOGIST REVIEW

## 2017-08-27 MED ORDER — ALPRAZOLAM 0.25 MG PO TABS: 0.2500 mg | ORAL_TABLET | Freq: Every evening | ORAL | 0 refills | Status: DC | PRN

## 2017-08-27 MED ORDER — LORAZEPAM 1 MG PO TABS
0.5000 mg | ORAL_TABLET | Freq: Once | ORAL | Status: AC
  Administered 2017-08-27: 0.5 mg via ORAL

## 2017-08-27 MED ORDER — LORAZEPAM 1 MG PO TABS
ORAL_TABLET | ORAL | Status: AC
Start: 2017-08-27 — End: 2017-08-27
  Filled 2017-08-27: qty 1

## 2017-08-27 NOTE — Assessment & Plan Note (Signed)
She has poor oral intake and anorexia I encouraged nutritional supplement and gave her prescription for boost along with daily meals This is improving

## 2017-08-27 NOTE — Assessment & Plan Note (Signed)
The patient has positive response to treatment However, she is frail with multiple co-morbidities I recommend proceed with chemotherapy as scheduled on Friday She will come back next week for G-CSF support After cycle 3 of chemo, I plan to repeat CT scan to assess response to treatment Given her frail status, I think she might need several more cycles of chemotherapy before the plan for interim debulking surgery

## 2017-08-27 NOTE — Telephone Encounter (Signed)
Gave avs and calendar for for November and December

## 2017-08-27 NOTE — Progress Notes (Signed)
[facility] PROGRESS NOTE  Patient Care Team: Asencion Noble, MD as PCP - General (Internal Medicine)  SUMMARY OF ONCOLOGIC HISTORY:   Ovarian CA, right ([place])   02/18/2016 Tumor Marker    Patient's tumor was tested for the following markers: CA125 Results of the tumor marker test revealed 45      05/22/2016 Tumor Marker    Patient's tumor was tested for the following markers: CA125 Results of the tumor marker test revealed 53      05/22/2016 Imaging    Outside pelvic US showed 4.1 cm adnexa mass      06/24/2017 Imaging    Ct abdomen and pelvis:  1. Interim finding of moderate ascites within the abdomen and pelvis with additional finding of diffuse nodular infiltration of the omentum and anterior mesenteric fat, the appearance would be consistent with peritoneal carcinomatosis/metastatic disease. Increasing retroperitoneal and upper abdominal adenopathy. 2. Re- demonstrated 3.8 cm cyst in the right adnexa. Enlarging soft tissue density in the left adnexa now with possible cystic component posteriorly. In light of the above findings, concern is for ovarian neoplasm. Correlation with pelvic ultrasound recommended. 3. Small right-sided pleural effusion, new since prior study 4. Stable hypodense splenic lesions since 2017.       06/25/2017 Imaging    US pelvis: 2.9 cm simple appearing cyst in the right ovary. Left ovary grossly unremarkable. Large volume ascites in the pelvis      06/30/2017 - 07/01/2017 Hospital Admission    She was admitted for evaluation of abdominal pain and ascites      07/01/2017 Pathology Results    PERITONEAL/ASCITIC FLUID(SPECIMEN 1 OF 1 COLLECTED 07/01/17): - POORLY DIFFERENTIATED CARCINOMA; SEE COMMENT Source Peritoneal/Ascitic Fluid, (specimen 1 of 1 collected 07/01/17) Gross Specimen: Received is/are 1000 cc's of brownish fluid. (BS:bs) Prepared: # Smears: 0 # Concentration Technique Slides (i.e. ThinPrep): 1 # Cell Block:  1 Additional Studies: Also received Hematology slide - [id number]. Comment The tumor cells are positive for cytokeratin 7 and Pax-8 but negative for cytokeratin 20, CDX-2, GATA-3, Napsin-A and TTF-1. Based on the immunoprofile a gynecology primary is favored      07/01/2017 Procedure    Successful ultrasound-guided diagnostic and therapeutic paracentesis yielding 2.5 liters of peritoneal fluid      07/07/2017 - 07/09/2017 Hospital Admission    She was admitted for management of malignant ascites      07/08/2017 Procedure    Successful ultrasound-guided therapeutic paracentesis yielding 2.7 liters liters of peritoneal fluid      07/12/2017 Procedure    Successful ultrasound-guided paracentesis yielding 1450 mL of peritoneal fluid      07/18/2017 - 07/24/2017 Hospital Admission    She was admitted for expedited treatment      07/18/2017 Tumor Marker    Patient's tumor was tested for the following markers: CA125 Results of the tumor marker test revealed 1941      07/19/2017 -  Chemotherapy    The patient had palonosetron (ALOXI) injection 0.25 mg, 0.25 mg, Intravenous,  Once, 1 of 6 cycles  pegfilgrastim (NEULASTA) injection 6 mg, 6 mg, Subcutaneous,  Once, 1 of 6 cycles  CARBOplatin (PARAPLATIN) 490 mg in sodium chloride 0.9 % 250 mL chemo infusion, 490 mg (100 % of original dose 487.2 mg), Intravenous,  Once, 1 of 6 cycles Dose modification:   (original dose 487.2 mg, Cycle 1)  PACLitaxel (TAXOL) 336 mg in dextrose 5 % 500 mL chemo infusion (> 73m/m2), 175 mg/m2 = 336 mg, Intravenous,  Once, 1 of 6 cycles  fosaprepitant (EMEND) 150 mg, dexamethasone (DECADRON) 12 mg in sodium chloride 0.9 % 145 mL IVPB, , Intravenous,  Once, 1 of 6 cycles  for chemotherapy treatment.        08/06/2017 Procedure    Successful ultrasound-guided therapeutic paracentesis yielding 2.6 liters of peritoneal fluid.      08/09/2017 Tumor Marker    Patient's tumor was tested for the following markers:  CA125 Results of the tumor marker test revealed 1665      08/15/2017 Tumor Marker    Patient's tumor was tested for the following markers: CA125 Results of the tumor marker test revealed 937.9      08/20/2017 Imaging    ECHO: Normal LV size with EF 60-65%. Normal RV size and systolic function. No significant valvular abnormalities.       INTERVAL HISTORY: Please see below for problem oriented charting. She returns to be seen prior to cycle 3 of chemotherapy Overall, she is improving She has lost almost 9 pounds of fluid weight His shortness of breath has improved She is getting stronger with physical therapy and rehabilitation She denies problems with peripheral neuropathy No recent nausea or vomiting She continues to have mild anxiety, stable on Xanax and she requested prescription refills  REVIEW OF SYSTEMS:   Constitutional: Denies fevers, chills or abnormal weight loss Eyes: Denies blurriness of vision Ears, nose, mouth, throat, and face: Denies mucositis or sore throat Respiratory: Denies cough, dyspnea or wheezes Cardiovascular: Denies palpitation, chest discomfort or lower extremity swelling Gastrointestinal:  Denies nausea, heartburn or change in bowel habits Skin: Denies abnormal skin rashes Lymphatics: Denies new lymphadenopathy or easy bruising Neurological:Denies numbness, tingling or new weaknesses Behavioral/Psych: Mood is stable, no new changes  All other systems were reviewed with the patient and are negative.  I have reviewed the past medical history, past surgical history, social history and family history with the patient and they are unchanged from previous note.  ALLERGIES:  is allergic to codeine.  MEDICATIONS:  Current Outpatient Medications  Medication Sig Dispense Refill  . albuterol (PROAIR HFA) 108 (90 Base) MCG/ACT inhaler Inhale 2 puffs into the lungs every 6 (six) hours as needed for wheezing or shortness of breath. 1 Inhaler 3  . ALPRAZolam  (XANAX) 0.25 MG tablet Take 1 tablet (0.25 mg total) by mouth at bedtime as needed for anxiety. 60 tablet 0  . azelastine (ASTELIN) 0.1 % nasal spray Place 2 sprays into both nostrils 2 (two) times daily. Use in each nostril as directed    . budesonide-formoterol (SYMBICORT) 160-4.5 MCG/ACT inhaler Inhale 2 puffs into the lungs 2 (two) times daily. 1 Inhaler 3  . cephALEXin (KEFLEX) 500 MG capsule Take 1 capsule (500 mg total) 3 (three) times daily by mouth. 21 capsule 0  . Cholecalciferol (VITAMIN D) 2000 units CAPS Take 2,000 Units by mouth daily.    Marland Kitchen dexamethasone (DECADRON) 4 MG tablet Take 5 tablets at 9 pm the day before chemo and to take 5 tabs at 6 am the day of chemo, every 3 weeks 30 tablet 1  . diltiazem (CARTIA XT) 120 MG 24 hr capsule TAKE 1 CAPSULE BY MOUTH EVERY DAY 90 capsule 3  . diphenoxylate-atropine (LOMOTIL) 2.5-0.025 MG tablet Take 1 tablet by mouth 4 (four) times daily. (Patient taking differently: Take 1 tablet by mouth 4 (four) times daily as needed for diarrhea or loose stools. ) 360 tablet 1  . fluticasone (FLONASE) 50 MCG/ACT nasal spray Place 2 sprays  into both nostrils 2 (two) times daily. For nasal congestion    . furosemide (LASIX) 40 MG tablet Take 1 tab twice daily take at 8am and 2pm. 60 tablet 5  . levothyroxine (SYNTHROID, LEVOTHROID) 137 MCG tablet Take 137 mcg by mouth every morning. For thyroid therapy    . lidocaine-prilocaine (EMLA) cream Apply 1 application topically as needed. 30 g 6  . loperamide (IMODIUM) 2 MG capsule Take 2 capsules (4 mg total) by mouth 3 (three) times daily. 30 capsule 0  . MAGNESIUM GLYCINATE PLUS PO Take 400 mg by mouth daily.    . methylcellulose (ARTIFICIAL TEARS) 1 % ophthalmic solution Place 1 drop into both eyes 2 (two) times daily as needed. Dry eyes    . potassium chloride SA (K-DUR,KLOR-CON) 20 MEQ tablet Take 1 tablet (20 mEq total) 2 (two) times daily by mouth. 30 tablet 0  . prochlorperazine (COMPAZINE) 10 MG tablet Take  10 mg by mouth every 6 (six) hours as needed for nausea.  1  . senna-docusate (SENOKOT-S) 8.6-50 MG tablet Take 1 tablet by mouth at bedtime. 30 tablet 0  . XARELTO 20 MG TABS tablet TAKE 1 TABLET BY MOUTH EVERY DAY WITH SUPPER 90 tablet 0   No current facility-administered medications for this visit.     PHYSICAL EXAMINATION: ECOG PERFORMANCE STATUS: 2 - Symptomatic, <50% confined to bed  Vitals:   08/27/17 0950  BP: 124/65  Pulse: 96  Resp: 16  Temp: 97.7 F (36.5 C)  SpO2: 100%   Filed Weights   08/27/17 0950  Weight: 173 lb 8 oz (78.7 kg)    GENERAL:alert, no distress and comfortable.  She is sitting on the wheelchair with oxygen delivered via nasal cannula SKIN: skin color, texture, turgor are normal, no rashes or significant lesions EYES: normal, Conjunctiva are pink and non-injected, sclera clear OROPHARYNX:no exudate, no erythema and lips, buccal mucosa, and tongue normal  NECK: supple, thyroid normal size, non-tender, without nodularity LYMPH:  no palpable lymphadenopathy in the cervical, axillary or inguinal LUNGS: clear to auscultation and percussion with normal breathing effort HEART: regular rate & rhythm and no murmurs with moderate bilateral lower extremity edema ABDOMEN:abdomen soft, non-tender and normal bowel sounds Musculoskeletal:no cyanosis of digits and no clubbing  NEURO: alert & oriented x 3 with fluent speech, no focal motor/sensory deficits  LABORATORY DATA:  I have reviewed the data as listed    Component Value Date/Time   NA 135 (L) 08/27/2017 0917   K 3.5 08/27/2017 0917   CL 94 (L) 08/12/2017 1137   CO2 31 (H) 08/27/2017 0917   GLUCOSE 126 08/27/2017 0917   BUN 12.1 08/27/2017 0917   CREATININE 0.8 08/27/2017 0917   CALCIUM 8.2 (L) 08/27/2017 0917   PROT 6.6 08/27/2017 0917   ALBUMIN 2.9 (L) 08/27/2017 0917   AST 22 08/27/2017 0917   ALT 20 08/27/2017 0917   ALKPHOS 52 08/27/2017 0917   BILITOT 0.43 08/27/2017 0917   GFRNONAA >60  07/23/2017 0536   GFRAA >60 07/23/2017 0536    No results found for: SPEP, UPEP  Lab Results  Component Value Date   WBC 5.6 08/27/2017   NEUTROABS 3.6 08/27/2017   HGB 10.9 (L) 08/27/2017   HCT 34.2 (L) 08/27/2017   MCV 91.9 08/27/2017   PLT 131 (L) 08/27/2017      Chemistry      Component Value Date/Time   NA 135 (L) 08/27/2017 0917   K 3.5 08/27/2017 0917   CL 94 (L)  08/12/2017 1137   CO2 31 (H) 08/27/2017 0917   BUN 12.1 08/27/2017 0917   CREATININE 0.8 08/27/2017 0917      Component Value Date/Time   CALCIUM 8.2 (L) 08/27/2017 0917   ALKPHOS 52 08/27/2017 0917   AST 22 08/27/2017 0917   ALT 20 08/27/2017 0917   BILITOT 0.43 08/27/2017 0917       RADIOGRAPHIC STUDIES: I have personally reviewed the radiological images as listed and agreed with the findings in the report. Dg Chest 2 View  Result Date: 08/12/2017 CLINICAL DATA:  Shortness of Breath EXAM: CHEST  2 VIEW COMPARISON:  July 21, 2017 FINDINGS: Pleural effusions bilaterally are significantly smaller. Small pleural effusions remain with bibasilar atelectasis present. Lungs elsewhere clear. Heart size and pulmonary vascularity are normal. There is aortic atherosclerosis. No adenopathy. Port-A-Cath tip is at the cavoatrial junction. No pneumothorax. There is degenerative change in the lower thoracic spine. Bones are osteoporotic. IMPRESSION: Small pleural effusions remain with bibasilar atelectasis. Pleural effusions are considerably smaller compared to most recent prior study. Lungs elsewhere clear. Heart size within normal limits. There is aortic atherosclerosis. Bones osteoporotic. Aortic Atherosclerosis (ICD10-I70.0). Electronically Signed   By: Lowella Grip III M.D.   On: 08/12/2017 17:24   US Paracentesis  Result Date: 08/06/2017 INDICATION: Ovarian cancer with recurrent malignant ascites; request made for therapeutic paracentesis. EXAM: ULTRASOUND GUIDED THERAPEUTIC PARACENTESIS MEDICATIONS: None.  COMPLICATIONS: None immediate. PROCEDURE: Informed written consent was obtained from the patient after a discussion of the risks, benefits and alternatives to treatment. A timeout was performed prior to the initiation of the procedure. Initial ultrasound scanning demonstrates a small to moderate amount of ascites within the right lower abdominal quadrant. The right lower abdomen was prepped and draped in the usual sterile fashion. 2% lidocaine was used for local anesthesia. Following this, a Safe-T-Centesis catheter was introduced. An ultrasound image was saved for documentation purposes. The paracentesis was performed. The catheter was removed and a dressing was applied. The patient tolerated the procedure well without immediate post procedural complication. FINDINGS: A total of approximately 2.6 liters of chylous/milky colored fluid was removed. IMPRESSION: Successful ultrasound-guided therapeutic paracentesis yielding 2.6 liters of peritoneal fluid. Read by: Rowe Robert, PA-C Electronically Signed   By: Marybelle Killings M.D.   On: 08/06/2017 12:43    ASSESSMENT & PLAN:  Ovarian CA, right ([place]) The patient has positive response to treatment However, she is frail with multiple co-morbidities I recommend proceed with chemotherapy as scheduled on Friday She will come back next week for G-CSF support After cycle 3 of chemo, I plan to repeat CT scan to assess response to treatment Given her frail status, I think she might need several more cycles of chemotherapy before the plan for interim debulking surgery  Pancytopenia, acquired ([place]) She has acquired pancytopenia She does not need blood transfusion She will continue G-CSF support for 2 days after each cycle of treatment  Protein-calorie malnutrition, moderate ([place]) She has poor oral intake and anorexia I encouraged nutritional supplement and gave her prescription for boost along with daily meals This is improving  Bilateral leg edema She has lost  9 pounds of weight due to diuretic therapy Overall, she is improving She would continue diuretic therapy as directed   Orders Placed This Encounter  Procedures  . CT CHEST W CONTRAST    Standing Status:   Future    Standing Expiration Date:   08/27/2018    Order Specific Question:   If indicated for the ordered  procedure, I authorize the administration of contrast media per Radiology protocol    Answer:   Yes    Order Specific Question:   Preferred imaging location?    Answer:   [facility]    Order Specific Question:   Radiology Contrast Protocol - do NOT remove file path    Answer:   [email or web address]  . CT ABDOMEN PELVIS W CONTRAST    Standing Status:   Future    Standing Expiration Date:   08/27/2018    Order Specific Question:   If indicated for the ordered procedure, I authorize the administration of contrast media per Radiology protocol    Answer:   Yes    Order Specific Question:   Preferred imaging location?    Answer:   [facility]    Order Specific Question:   Radiology Contrast Protocol - do NOT remove file path    Answer:   [email or web address]   All questions were answered. The patient knows to call the clinic with any problems, questions or concerns. No barriers to learning was detected. I spent 25 minutes counseling the patient face to face. The total time spent in the appointment was 30 minutes and more than 50% was on counseling and review of test results     Heath Lark, MD 08/27/2017 5:27 PM

## 2017-08-27 NOTE — Telephone Encounter (Signed)
08/27/2017 @ 10:10 am called [phone] and left message for patient that the Attending Physician statement was filled out and successfully faxed to [facility] @ [phone] on 08/21/2017

## 2017-08-27 NOTE — Assessment & Plan Note (Signed)
She has lost 9 pounds of weight due to diuretic therapy Overall, she is improving She would continue diuretic therapy as directed

## 2017-08-27 NOTE — Assessment & Plan Note (Signed)
She has acquired pancytopenia She does not need blood transfusion She will continue G-CSF support for 2 days after each cycle of treatment

## 2017-08-30 ENCOUNTER — Ambulatory Visit (HOSPITAL_BASED_OUTPATIENT_CLINIC_OR_DEPARTMENT_OTHER): Payer: Medicare Other

## 2017-08-30 VITALS — BP 116/61 | HR 100 | Temp 97.9°F | Resp 20

## 2017-08-30 DIAGNOSIS — Z5111 Encounter for antineoplastic chemotherapy: Secondary | ICD-10-CM

## 2017-08-30 DIAGNOSIS — C561 Malignant neoplasm of right ovary: Secondary | ICD-10-CM | POA: Diagnosis not present

## 2017-08-30 MED ORDER — FOSAPREPITANT DIMEGLUMINE INJECTION 150 MG
Freq: Once | INTRAVENOUS | Status: AC
  Administered 2017-08-30: 10:00:00 via INTRAVENOUS
  Filled 2017-08-30: qty 5

## 2017-08-30 MED ORDER — SODIUM CHLORIDE 0.9% FLUSH
10.0000 mL | INTRAVENOUS | Status: DC | PRN
  Administered 2017-08-30: 10 mL
  Filled 2017-08-30: qty 10

## 2017-08-30 MED ORDER — HEPARIN SOD (PORK) LOCK FLUSH 100 UNIT/ML IV SOLN
500.0000 [IU] | Freq: Once | INTRAVENOUS | Status: AC | PRN
  Administered 2017-08-30: 500 [IU]
  Filled 2017-08-30: qty 5

## 2017-08-30 MED ORDER — FAMOTIDINE IN NACL 20-0.9 MG/50ML-% IV SOLN
INTRAVENOUS | Status: AC
  Filled 2017-08-30: qty 50

## 2017-08-30 MED ORDER — DIPHENHYDRAMINE HCL 50 MG/ML IJ SOLN
50.0000 mg | Freq: Once | INTRAMUSCULAR | Status: AC
  Administered 2017-08-30: 50 mg via INTRAVENOUS

## 2017-08-30 MED ORDER — FAMOTIDINE IN NACL 20-0.9 MG/50ML-% IV SOLN
20.0000 mg | Freq: Once | INTRAVENOUS | Status: AC
  Administered 2017-08-30: 20 mg via INTRAVENOUS

## 2017-08-30 MED ORDER — SODIUM CHLORIDE 0.9 % IV SOLN
487.2000 mg | Freq: Once | INTRAVENOUS | Status: AC
  Administered 2017-08-30: 490 mg via INTRAVENOUS
  Filled 2017-08-30: qty 49

## 2017-08-30 MED ORDER — DIPHENHYDRAMINE HCL 50 MG/ML IJ SOLN
INTRAMUSCULAR | Status: AC
  Filled 2017-08-30: qty 1

## 2017-08-30 MED ORDER — PALONOSETRON HCL INJECTION 0.25 MG/5ML
INTRAVENOUS | Status: AC
  Filled 2017-08-30: qty 5

## 2017-08-30 MED ORDER — PALONOSETRON HCL INJECTION 0.25 MG/5ML
0.2500 mg | Freq: Once | INTRAVENOUS | Status: AC
  Administered 2017-08-30: 0.25 mg via INTRAVENOUS

## 2017-08-30 MED ORDER — SODIUM CHLORIDE 0.9 % IV SOLN
Freq: Once | INTRAVENOUS | Status: AC
  Administered 2017-08-30: 09:00:00 via INTRAVENOUS

## 2017-08-30 MED ORDER — SODIUM CHLORIDE 0.9 % IV SOLN
175.0000 mg/m2 | Freq: Once | INTRAVENOUS | Status: AC
  Administered 2017-08-30: 336 mg via INTRAVENOUS
  Filled 2017-08-30: qty 56

## 2017-08-30 NOTE — Patient Instructions (Signed)
   [facility] Discharge Instructions for Patients Receiving Chemotherapy  Today you received the following chemotherapy agents Taxol and Carboplatin   To help prevent nausea and vomiting after your treatment, we encourage you to take your nausea medication as directed.    If you develop nausea and vomiting that is not controlled by your nausea medication, call the clinic.   BELOW ARE SYMPTOMS THAT SHOULD BE REPORTED IMMEDIATELY:  *FEVER GREATER THAN 100.5 F  *CHILLS WITH OR WITHOUT FEVER  NAUSEA AND VOMITING THAT IS NOT CONTROLLED WITH YOUR NAUSEA MEDICATION  *UNUSUAL SHORTNESS OF BREATH  *UNUSUAL BRUISING OR BLEEDING  TENDERNESS IN MOUTH AND THROAT WITH OR WITHOUT PRESENCE OF ULCERS  *URINARY PROBLEMS  *BOWEL PROBLEMS  UNUSUAL RASH Items with * indicate a potential emergency and should be followed up as soon as possible.  Feel free to call the clinic should you have any questions or concerns. The clinic phone number is [phone].  Please show the CHEMO ALERT CARD at check-in to the Emergency Department and triage nurse.   

## 2017-09-02 ENCOUNTER — Ambulatory Visit (HOSPITAL_BASED_OUTPATIENT_CLINIC_OR_DEPARTMENT_OTHER): Payer: Medicare Other

## 2017-09-02 VITALS — BP 101/51 | HR 99 | Temp 97.8°F | Resp 20

## 2017-09-02 DIAGNOSIS — Z5189 Encounter for other specified aftercare: Secondary | ICD-10-CM

## 2017-09-02 DIAGNOSIS — C561 Malignant neoplasm of right ovary: Secondary | ICD-10-CM | POA: Diagnosis present

## 2017-09-02 MED ORDER — TBO-FILGRASTIM 300 MCG/0.5ML ~~LOC~~ SOSY
300.0000 ug | PREFILLED_SYRINGE | Freq: Once | SUBCUTANEOUS | Status: AC
  Administered 2017-09-02: 300 ug via SUBCUTANEOUS
  Filled 2017-09-02: qty 0.5

## 2017-09-03 ENCOUNTER — Ambulatory Visit (HOSPITAL_BASED_OUTPATIENT_CLINIC_OR_DEPARTMENT_OTHER): Payer: PRIVATE HEALTH INSURANCE

## 2017-09-03 VITALS — BP 108/51 | HR 104 | Temp 98.0°F | Resp 18

## 2017-09-03 DIAGNOSIS — Z5189 Encounter for other specified aftercare: Secondary | ICD-10-CM

## 2017-09-03 DIAGNOSIS — C561 Malignant neoplasm of right ovary: Secondary | ICD-10-CM | POA: Diagnosis not present

## 2017-09-03 MED ORDER — TBO-FILGRASTIM 300 MCG/0.5ML ~~LOC~~ SOSY
300.0000 ug | PREFILLED_SYRINGE | Freq: Once | SUBCUTANEOUS | Status: AC
  Administered 2017-09-03: 300 ug via SUBCUTANEOUS
  Filled 2017-09-03: qty 0.5

## 2017-09-09 ENCOUNTER — Encounter: Payer: Self-pay | Admitting: Physician Assistant

## 2017-09-09 ENCOUNTER — Ambulatory Visit (INDEPENDENT_AMBULATORY_CARE_PROVIDER_SITE_OTHER): Payer: Medicare Other | Admitting: Physician Assistant

## 2017-09-09 VITALS — BP 110/52 | HR 69 | Ht 64.0 in | Wt 164.8 lb

## 2017-09-09 DIAGNOSIS — I5033 Acute on chronic diastolic (congestive) heart failure: Secondary | ICD-10-CM

## 2017-09-09 DIAGNOSIS — I480 Paroxysmal atrial fibrillation: Secondary | ICD-10-CM

## 2017-09-09 DIAGNOSIS — J438 Other emphysema: Secondary | ICD-10-CM | POA: Diagnosis not present

## 2017-09-09 DIAGNOSIS — I739 Peripheral vascular disease, unspecified: Secondary | ICD-10-CM

## 2017-09-09 MED ORDER — FUROSEMIDE 40 MG PO TABS: ORAL_TABLET | ORAL | 3 refills | Status: DC

## 2017-09-09 MED ORDER — POTASSIUM CHLORIDE CRYS ER 20 MEQ PO TBCR: EXTENDED_RELEASE_TABLET | ORAL | 3 refills | Status: DC

## 2017-09-09 NOTE — Progress Notes (Signed)
Cardiology Office Note    Date:  09/09/2017   ID:  [name], DOB July 31, 1933, MRN [id number]  PCP:  Asencion Noble, MD  Cardiologist: Dr. Saunders Revel  Chief Complaint  Patient presents with  . Follow-up    History of Present Illness:  [name] is a 83 y.o. female with history of proximal atrial fibrillation, chronic diastolic heart failure, peripheral vascular disease, and recently diagnosed ovarian cancer (currently undergoing chemotherapy).  Last saw Dr. Saunders Revel 08/19/17 with acute on chronic diastolic CHF after 2 cycles of chemo.Lasix was increased and weight was down 9 lbs. 2 D echo 08/20/17 with normal LV function.   Patient comes in today accompanied by her daughter.  She has lost an additional 18 pounds.  She is feeling much better.  She is not using her oxygen as much.  She still very weak from chemo.  She is hoping to leave the nursing home by Christmas.     Past Medical History:  Diagnosis Date  . COPD (chronic obstructive pulmonary disease) ([place])   . Depression   . Diverticulosis   . Family hx of colon cancer   . Fibromyalgia   . GERD (gastroesophageal reflux disease)   . Hemorrhoids   . Hiatal hernia   . History of shingles   . Hypothyroidism   . IBS (irritable bowel syndrome)   . Lymphocytic colitis   . Osteoporosis   . Ovarian cancer ([place])   . Paroxysmal atrial fibrillation (H[facility])   . Psoriatic arthritis ([place])   . Rectal polyp   . Schatzki's ring   . Uterine polyp     Past Surgical History:  Procedure Laterality Date  . CHOLECYSTECTOMY  Dec 04, 2010  . COLONOSCOPY    . FEMUR IM NAIL Left 12/11/2013   Procedure: INTRAMEDULLARY (IM) NAIL FEMORAL;  Surgeon: [name], MD;  Location: [facility];  Service: Orthopedics;  Laterality: Left;  . FRACTURE SURGERY Left 12/11/2013   hip  . IR FLUORO GUIDE PORT INSERTION RIGHT  07/22/2017  . IR PARACENTESIS  07/12/2017  . IR US GUIDE VASC ACCESS RIGHT  07/22/2017  . KNEE SURGERY Left    x 2  . TOTAL KNEE  REVISION  08/06/2012   Procedure: TOTAL KNEE REVISION;  Surgeon: [name], MD;  Location: [facility];  Service: Orthopedics;  Laterality: Left;  Left Total Knee Arthroplasty Revision  . Uterine polypectomy      Current Medications: Current Meds  Medication Sig  . albuterol (PROAIR HFA) 108 (90 Base) MCG/ACT inhaler Inhale 2 puffs into the lungs every 6 (six) hours as needed for wheezing or shortness of breath.  . ALPRAZolam (XANAX) 0.25 MG tablet Take 1 tablet (0.25 mg total) by mouth at bedtime as needed for anxiety.  Marland Kitchen azelastine (ASTELIN) 0.1 % nasal spray Place 2 sprays into both nostrils 2 (two) times daily. Use in each nostril as directed  . budesonide-formoterol (SYMBICORT) 160-4.5 MCG/ACT inhaler Inhale 2 puffs into the lungs 2 (two) times daily.  . cephALEXin (KEFLEX) 500 MG capsule Take 1 capsule (500 mg total) 3 (three) times daily by mouth.  . Cholecalciferol (VITAMIN D) 2000 units CAPS Take 2,000 Units by mouth daily.  Marland Kitchen dexamethasone (DECADRON) 4 MG tablet Take 5 tablets at 9 pm the day before chemo and to take 5 tabs at 6 am the day of chemo, every 3 weeks  . diltiazem (CARTIA XT) 120 MG 24 hr capsule TAKE 1 CAPSULE BY MOUTH EVERY DAY  .  diphenoxylate-atropine (LOMOTIL) 2.5-0.025 MG tablet Take 1 tablet by mouth 4 (four) times daily. (Patient taking differently: Take 1 tablet by mouth 4 (four) times daily as needed for diarrhea or loose stools. )  . fluticasone (FLONASE) 50 MCG/ACT nasal spray Place 2 sprays into both nostrils 2 (two) times daily. For nasal congestion  . levothyroxine (SYNTHROID, LEVOTHROID) 137 MCG tablet Take 137 mcg by mouth every morning. For thyroid therapy  . lidocaine-prilocaine (EMLA) cream Apply 1 application topically as needed.  . loperamide (IMODIUM) 2 MG capsule Take 2 capsules (4 mg total) by mouth 3 (three) times daily.  . methylcellulose (ARTIFICIAL TEARS) 1 % ophthalmic solution Place 1 drop into both eyes 2 (two) times daily as needed. Dry  eyes  . prochlorperazine (COMPAZINE) 10 MG tablet Take 10 mg by mouth every 6 (six) hours as needed for nausea.  Marland Kitchen senna-docusate (SENOKOT-S) 8.6-50 MG tablet Take 1 tablet by mouth at bedtime.  Alveda Reasons 20 MG TABS tablet TAKE 1 TABLET BY MOUTH EVERY DAY WITH SUPPER  . [DISCONTINUED] furosemide (LASIX) 40 MG tablet Take 1 tab twice daily take at 8am and 2pm.  . [DISCONTINUED] potassium chloride SA (K-DUR,KLOR-CON) 20 MEQ tablet Take 1 tablet (20 mEq total) 2 (two) times daily by mouth.     Allergies:   Codeine   Social History   Socioeconomic History  . Marital status: Divorced    Spouse name: None  . Number of children: 1  . Years of education: 57  . Highest education level: None  Social Needs  . Financial resource strain: None  . Food insecurity - worry: None  . Food insecurity - inability: None  . Transportation needs - medical: None  . Transportation needs - non-medical: None  Occupational History  . Occupation: retired    Fish farm manager: RETIRED  Tobacco Use  . Smoking status: Former Smoker    Packs/day: 1.00    Years: 20.00    Pack years: 20.00    Types: Cigarettes    Last attempt to quit: 10/08/1980    Years since quitting: 36.9  . Smokeless tobacco: Never Used  Substance and Sexual Activity  . Alcohol use: Yes    Comment: rarely, 12-23-15 rarely  . Drug use: No  . Sexual activity: Not Currently  Other Topics Concern  . None  Social History Narrative   Patient lives at home by herself.    Patient is retired.    Patient has 12 th grade education.            Family History:  The patient's family history includes Colon cancer in her maternal grandfather and mother; Depression in her sister; Heart disease in her father; Lung cancer in her father.   ROS:   Please see the history of present illness.    Review of Systems  Constitution: Positive for weakness and malaise/fatigue.  HENT: Negative.   Eyes: Negative.   Cardiovascular: Positive for dyspnea on exertion and  palpitations.  Respiratory: Negative.   Hematologic/Lymphatic: Negative.   Musculoskeletal: Negative.  Negative for joint pain.  Gastrointestinal: Positive for nausea and vomiting.  Genitourinary: Negative.   Neurological: Positive for loss of balance.  Psychiatric/Behavioral: The patient is nervous/anxious.    All other systems reviewed and are negative.   PHYSICAL EXAM:   VS:  BP (!) 110/52   Pulse 69   Ht 5\' 4"  (1.626 m)   Wt 164 lb 12.8 oz (74.8 kg)   SpO2 96%   BMI 28.29 kg/m  Physical Exam  GEN: Well nourished, well developed, in no acute distress  Neck: no JVD, carotid bruits, or masses Cardiac:RRR; 2/6 systolic murmur the left sternal border Respiratory: Decreased breath sounds but clear to auscultation bilaterally, normal work of breathing GI: soft, nontender, nondistended, + BS Ext: without cyanosis, clubbing, or edema, Good distal pulses bilaterally Neuro:  Alert and Oriented x 3 Psych: euthymic mood, full affect  Wt Readings from Last 3 Encounters:  09/09/17 164 lb 12.8 oz (74.8 kg)  08/27/17 173 lb 8 oz (78.7 kg)  08/19/17 182 lb (82.6 kg)      Studies/Labs Reviewed:   EKG:  EKG is not ordered today.    Recent Labs: 07/07/2017: B Natriuretic Peptide 42.9 07/18/2017: Magnesium 1.9 08/12/2017: NT-Pro BNP 837 08/27/2017: ALT 20; BUN 12.1; Creatinine 0.8; HGB 10.9; Platelets 131; Potassium 3.5; Sodium 135   Lipid Panel No results found for: CHOL, TRIG, HDL, CHOLHDL, VLDL, LDLCALC, LDLDIRECT  Additional studies/ records that were reviewed today include:  2Decho 08/20/17 Study Conclusions   - Left ventricle: The cavity size was normal. Wall thickness was   normal. Systolic function was normal. The estimated ejection   fraction was in the range of 60% to 65%. Wall motion was normal;   there were no regional wall motion abnormalities. Doppler   parameters are consistent with abnormal left ventricular   relaxation (grade 1 diastolic dysfunction). - Aortic  valve: There was no stenosis. There was trivial   regurgitation. - Mitral valve: Mildly calcified annulus. There was no significant   regurgitation. - Right ventricle: The cavity size was normal. Systolic function   was normal. - Tricuspid valve: Peak RV-RA gradient (S): 27 mm Hg. - Pulmonary arteries: PA peak pressure: 30 mm Hg (S). - Inferior vena cava: The vessel was normal in size. The   respirophasic diameter changes were in the normal range (= 50%),   consistent with normal central venous pressure.   Impressions:   - Normal LV size with EF 60-65%. Normal RV size and systolic   function. No significant valvular abnormalities.       ASSESSMENT:    1. Acute on chronic diastolic heart failure (HCC)   2. Paroxysmal atrial fibrillation ([place])   3. PAD (peripheral artery disease) (HCC)      PLAN:  In order of problems listed above:  Acute on chronic diastolic CHF has resolved with over 27 pound weight loss.  Patient has no edema on exam today.  Will attempt to reduce Lasix to 40 mg once daily potassium 20 mEq daily.  Take extra Lasix and potassium if weight gain of 2 or 3 pounds overnight.  She is not eating well with chemo and I am afraid she may get dehydrated.  PAF on Xarelto  PAD stable without complaints  COPD on oxygen    Medication Adjustments/Labs and Tests Ordered: Current medicines are reviewed at length with the patient today.  Concerns regarding medicines are outlined above.  Medication changes, Labs and Tests ordered today are listed in the Patient Instructions below. Patient Instructions  Medication Instructions:  Your physician has recommended you make the following change in your medication:  1.  REDUCE the Lasix to 40 mg daily 2.  REDUCE the Potassium to 20 meq daily YOU MAY TAKE AN EXTRA LASIX AND POTASSIUM IF YOU NOTICE A WEIGHT INCREASE OF 2-3 LBS OVERNIGHT.   Labwork: None ordered  Testing/Procedures: None ordered  Follow-Up: Your  physician recommends that you schedule a follow-up appointment in: 6  WEEKS WITH DR. END  Any Other Special Instructions Will Be Listed Below (If Applicable).  Low-Sodium Eating Plan Sodium, which is an element that makes up salt, helps you maintain a healthy balance of fluids in your body. Too much sodium can increase your blood pressure and cause fluid and waste to be held in your body. Your health care provider or dietitian may recommend following this plan if you have high blood pressure (hypertension), kidney disease, liver disease, or heart failure. Eating less sodium can help lower your blood pressure, reduce swelling, and protect your heart, liver, and kidneys. What are tips for following this plan? General guidelines  Most people on this plan should limit their sodium intake to 1,500-2,000 mg (milligrams) of sodium each day. Reading food labels  The Nutrition Facts label lists the amount of sodium in one serving of the food. If you eat more than one serving, you must multiply the listed amount of sodium by the number of servings.  Choose foods with less than 140 mg of sodium per serving.  Avoid foods with 300 mg of sodium or more per serving. Shopping  Look for lower-sodium products, often labeled as "low-sodium" or "no salt added."  Always check the sodium content even if foods are labeled as "unsalted" or "no salt added".  Buy fresh foods. ? Avoid canned foods and premade or frozen meals. ? Avoid canned, cured, or processed meats  Buy breads that have less than 80 mg of sodium per slice. Cooking  Eat more home-cooked food and less restaurant, buffet, and fast food.  Avoid adding salt when cooking. Use salt-free seasonings or herbs instead of table salt or sea salt. Check with your health care provider or pharmacist before using salt substitutes.  Cook with plant-based oils, such as canola, sunflower, or olive oil. Meal planning  When eating at a restaurant, ask that  your food be prepared with less salt or no salt, if possible.  Avoid foods that contain MSG (monosodium glutamate). MSG is sometimes added to [place] food, bouillon, and some canned foods. What foods are recommended? The items listed may not be a complete list. Talk with your dietitian about what dietary choices are best for you. Grains Low-sodium cereals, including oats, puffed wheat and rice, and shredded wheat. Low-sodium crackers. Unsalted rice. Unsalted pasta. Low-sodium bread. Whole-grain breads and whole-grain pasta. Vegetables Fresh or frozen vegetables. "No salt added" canned vegetables. "No salt added" tomato sauce and paste. Low-sodium or reduced-sodium tomato and vegetable juice. Fruits Fresh, frozen, or canned fruit. Fruit juice. Meats and other protein foods Fresh or frozen (no salt added) meat, poultry, seafood, and fish. Low-sodium canned tuna and salmon. Unsalted nuts. Dried peas, beans, and lentils without added salt. Unsalted canned beans. Eggs. Unsalted nut butters. Dairy Milk. Soy milk. Cheese that is naturally low in sodium, such as ricotta cheese, fresh mozzarella, or Swiss cheese Low-sodium or reduced-sodium cheese. Cream cheese. Yogurt. Fats and oils Unsalted butter. Unsalted margarine with no trans fat. Vegetable oils such as canola or olive oils. Seasonings and other foods Fresh and dried herbs and spices. Salt-free seasonings. Low-sodium mustard and ketchup. Sodium-free salad dressing. Sodium-free light mayonnaise. Fresh or refrigerated horseradish. Lemon juice. Vinegar. Homemade, reduced-sodium, or low-sodium soups. Unsalted popcorn and pretzels. Low-salt or salt-free chips. What foods are not recommended? The items listed may not be a complete list. Talk with your dietitian about what dietary choices are best for you. Grains Instant hot cereals. Bread stuffing, pancake, and biscuit mixes. Croutons. Seasoned  rice or pasta mixes. Noodle soup cups. Boxed or frozen  macaroni and cheese. Regular salted crackers. Self-rising flour. Vegetables Sauerkraut, pickled vegetables, and relishes. Olives. [place] fries. Onion rings. Regular canned vegetables (not low-sodium or reduced-sodium). Regular canned tomato sauce and paste (not low-sodium or reduced-sodium). Regular tomato and vegetable juice (not low-sodium or reduced-sodium). Frozen vegetables in sauces. Meats and other protein foods Meat or fish that is salted, canned, smoked, spiced, or pickled. Bacon, ham, sausage, hotdogs, corned beef, chipped beef, packaged lunch meats, salt pork, jerky, pickled herring, anchovies, regular canned tuna, sardines, salted nuts. Dairy Processed cheese and cheese spreads. Cheese curds. Blue cheese. Feta cheese. String cheese. Regular cottage cheese. Buttermilk. Canned milk. Fats and oils Salted butter. Regular margarine. Ghee. Bacon fat. Seasonings and other foods Onion salt, garlic salt, seasoned salt, table salt, and sea salt. Canned and packaged gravies. Worcestershire sauce. Tartar sauce. Barbecue sauce. Teriyaki sauce. Soy sauce, including reduced-sodium. Steak sauce. Fish sauce. Oyster sauce. Cocktail sauce. Horseradish that you find on the shelf. Regular ketchup and mustard. Meat flavorings and tenderizers. Bouillon cubes. Hot sauce and Tabasco sauce. Premade or packaged marinades. Premade or packaged taco seasonings. Relishes. Regular salad dressings. Salsa. Potato and tortilla chips. Corn chips and puffs. Salted popcorn and pretzels. Canned or dried soups. Pizza. Frozen entrees and pot pies. Summary  Eating less sodium can help lower your blood pressure, reduce swelling, and protect your heart, liver, and kidneys.  Most people on this plan should limit their sodium intake to 1,500-2,000 mg (milligrams) of sodium each day.  Canned, boxed, and frozen foods are high in sodium. Restaurant foods, fast foods, and pizza are also very high in sodium. You also get sodium by adding  salt to food.  Try to cook at home, eat more fresh fruits and vegetables, and eat less fast food, canned, processed, or prepared foods. This information is not intended to replace advice given to you by your health care provider. Make sure you discuss any questions you have with your health care provider. Document Released: 03/16/2002 Document Revised: 09/17/2016 Document Reviewed: 09/17/2016 Elsevier Interactive Patient Education  2017 [employer].     If you need a refill on your cardiac medications before your next appointment, please call your pharmacy.      Sumner Boast, PA-C  09/09/2017 11:07 AM    [facility] [address], [place], [place]  [address] Phone: [phone]; Fax: [phone]

## 2017-09-09 NOTE — Patient Instructions (Addendum)
Medication Instructions:  Your physician has recommended you make the following change in your medication:  1.  REDUCE the Lasix to 40 mg daily 2.  REDUCE the Potassium to 20 meq daily YOU MAY TAKE AN EXTRA LASIX AND POTASSIUM IF YOU NOTICE A WEIGHT INCREASE OF 2-3 LBS OVERNIGHT.   Labwork: None ordered  Testing/Procedures: None ordered  Follow-Up: Your physician recommends that you schedule a follow-up appointment in: [place] DR. END  Any Other Special Instructions Will Be Listed Below (If Applicable).  Low-Sodium Eating Plan Sodium, which is an element that makes up salt, helps you maintain a healthy balance of fluids in your body. Too much sodium can increase your blood pressure and cause fluid and waste to be held in your body. Your health care provider or dietitian may recommend following this plan if you have high blood pressure (hypertension), kidney disease, liver disease, or heart failure. Eating less sodium can help lower your blood pressure, reduce swelling, and protect your heart, liver, and kidneys. What are tips for following this plan? General guidelines  Most people on this plan should limit their sodium intake to 1,500-2,000 mg (milligrams) of sodium each day. Reading food labels  The Nutrition Facts label lists the amount of sodium in one serving of the food. If you eat more than one serving, you must multiply the listed amount of sodium by the number of servings.  Choose foods with less than 140 mg of sodium per serving.  Avoid foods with 300 mg of sodium or more per serving. Shopping  Look for lower-sodium products, often labeled as "low-sodium" or "no salt added."  Always check the sodium content even if foods are labeled as "unsalted" or "no salt added".  Buy fresh foods. ? Avoid canned foods and premade or frozen meals. ? Avoid canned, cured, or processed meats  Buy breads that have less than 80 mg of sodium per slice. Cooking  Eat more  home-cooked food and less restaurant, buffet, and fast food.  Avoid adding salt when cooking. Use salt-free seasonings or herbs instead of table salt or sea salt. Check with your health care provider or pharmacist before using salt substitutes.  Cook with plant-based oils, such as canola, sunflower, or olive oil. Meal planning  When eating at a restaurant, ask that your food be prepared with less salt or no salt, if possible.  Avoid foods that contain MSG (monosodium glutamate). MSG is sometimes added to [place] food, bouillon, and some canned foods. What foods are recommended? The items listed may not be a complete list. Talk with your dietitian about what dietary choices are best for you. Grains Low-sodium cereals, including oats, puffed wheat and rice, and shredded wheat. Low-sodium crackers. Unsalted rice. Unsalted pasta. Low-sodium bread. Whole-grain breads and whole-grain pasta. Vegetables Fresh or frozen vegetables. "No salt added" canned vegetables. "No salt added" tomato sauce and paste. Low-sodium or reduced-sodium tomato and vegetable juice. Fruits Fresh, frozen, or canned fruit. Fruit juice. Meats and other protein foods Fresh or frozen (no salt added) meat, poultry, seafood, and fish. Low-sodium canned tuna and salmon. Unsalted nuts. Dried peas, beans, and lentils without added salt. Unsalted canned beans. Eggs. Unsalted nut butters. Dairy Milk. Soy milk. Cheese that is naturally low in sodium, such as ricotta cheese, fresh mozzarella, or Swiss cheese Low-sodium or reduced-sodium cheese. Cream cheese. Yogurt. Fats and oils Unsalted butter. Unsalted margarine with no trans fat. Vegetable oils such as canola or olive oils. Seasonings and other foods Fresh and  dried herbs and spices. Salt-free seasonings. Low-sodium mustard and ketchup. Sodium-free salad dressing. Sodium-free light mayonnaise. Fresh or refrigerated horseradish. Lemon juice. Vinegar. Homemade, reduced-sodium, or  low-sodium soups. Unsalted popcorn and pretzels. Low-salt or salt-free chips. What foods are not recommended? The items listed may not be a complete list. Talk with your dietitian about what dietary choices are best for you. Grains Instant hot cereals. Bread stuffing, pancake, and biscuit mixes. Croutons. Seasoned rice or pasta mixes. Noodle soup cups. Boxed or frozen macaroni and cheese. Regular salted crackers. Self-rising flour. Vegetables Sauerkraut, pickled vegetables, and relishes. Olives. [place] fries. Onion rings. Regular canned vegetables (not low-sodium or reduced-sodium). Regular canned tomato sauce and paste (not low-sodium or reduced-sodium). Regular tomato and vegetable juice (not low-sodium or reduced-sodium). Frozen vegetables in sauces. Meats and other protein foods Meat or fish that is salted, canned, smoked, spiced, or pickled. Bacon, ham, sausage, hotdogs, corned beef, chipped beef, packaged lunch meats, salt pork, jerky, pickled herring, anchovies, regular canned tuna, sardines, salted nuts. Dairy Processed cheese and cheese spreads. Cheese curds. Blue cheese. Feta cheese. String cheese. Regular cottage cheese. Buttermilk. Canned milk. Fats and oils Salted butter. Regular margarine. Ghee. Bacon fat. Seasonings and other foods Onion salt, garlic salt, seasoned salt, table salt, and sea salt. Canned and packaged gravies. Worcestershire sauce. Tartar sauce. Barbecue sauce. Teriyaki sauce. Soy sauce, including reduced-sodium. Steak sauce. Fish sauce. Oyster sauce. Cocktail sauce. Horseradish that you find on the shelf. Regular ketchup and mustard. Meat flavorings and tenderizers. Bouillon cubes. Hot sauce and Tabasco sauce. Premade or packaged marinades. Premade or packaged taco seasonings. Relishes. Regular salad dressings. Salsa. Potato and tortilla chips. Corn chips and puffs. Salted popcorn and pretzels. Canned or dried soups. Pizza. Frozen entrees and pot pies. Summary  Eating  less sodium can help lower your blood pressure, reduce swelling, and protect your heart, liver, and kidneys.  Most people on this plan should limit their sodium intake to 1,500-2,000 mg (milligrams) of sodium each day.  Canned, boxed, and frozen foods are high in sodium. Restaurant foods, fast foods, and pizza are also very high in sodium. You also get sodium by adding salt to food.  Try to cook at home, eat more fresh fruits and vegetables, and eat less fast food, canned, processed, or prepared foods. This information is not intended to replace advice given to you by your health care provider. Make sure you discuss any questions you have with your health care provider. Document Released: 03/16/2002 Document Revised: 09/17/2016 Document Reviewed: 09/17/2016 Elsevier Interactive Patient Education  2017 [employer].     If you need a refill on your cardiac medications before your next appointment, please call your pharmacy.

## 2017-09-18 ENCOUNTER — Other Ambulatory Visit (HOSPITAL_BASED_OUTPATIENT_CLINIC_OR_DEPARTMENT_OTHER): Payer: Medicare Other

## 2017-09-18 ENCOUNTER — Ambulatory Visit: Payer: Medicare Other

## 2017-09-18 ENCOUNTER — Encounter (HOSPITAL_COMMUNITY): Payer: Self-pay

## 2017-09-18 ENCOUNTER — Other Ambulatory Visit: Payer: Self-pay | Admitting: Hematology and Oncology

## 2017-09-18 ENCOUNTER — Ambulatory Visit (HOSPITAL_COMMUNITY)
Admission: RE | Admit: 2017-09-18 | Discharge: 2017-09-18 | Disposition: A | Discharge status: Home / Self Care | Payer: Medicare Other | Source: Ambulatory Visit | Attending: Hematology and Oncology | Admitting: Hematology and Oncology

## 2017-09-18 DIAGNOSIS — C561 Malignant neoplasm of right ovary: Secondary | ICD-10-CM | POA: Diagnosis present

## 2017-09-18 DIAGNOSIS — J90 Pleural effusion, not elsewhere classified: Secondary | ICD-10-CM | POA: Diagnosis not present

## 2017-09-18 LAB — CBC WITH DIFFERENTIAL/PLATELET
BASO%: 0.4 % (ref 0.0–2.0)
BASOS ABS: 0 10*3/uL (ref 0.0–0.1)
EOS%: 0.4 % (ref 0.0–7.0)
Eosinophils Absolute: 0 10*3/uL (ref 0.0–0.5)
HEMATOCRIT: 33.7 % — AB (ref 34.8–46.6)
HGB: 11.1 g/dL — ABNORMAL LOW (ref 11.6–15.9)
LYMPH%: 22.9 % (ref 14.0–49.7)
MCH: 30.5 pg (ref 25.1–34.0)
MCHC: 32.9 g/dL (ref 31.5–36.0)
MCV: 92.6 fL (ref 79.5–101.0)
MONO#: 1 10*3/uL — ABNORMAL HIGH (ref 0.1–0.9)
MONO%: 14.8 % — AB (ref 0.0–14.0)
NEUT#: 4.2 10*3/uL (ref 1.5–6.5)
NEUT%: 61.5 % (ref 38.4–76.8)
Platelets: 151 10*3/uL (ref 145–400)
RBC: 3.64 10*6/uL — AB (ref 3.70–5.45)
RDW: 17.4 % — ABNORMAL HIGH (ref 11.2–14.5)
WBC: 6.8 10*3/uL (ref 3.9–10.3)
lymph#: 1.6 10*3/uL (ref 0.9–3.3)

## 2017-09-18 LAB — COMPREHENSIVE METABOLIC PANEL
ALBUMIN: 3.3 g/dL — AB (ref 3.5–5.0)
ALK PHOS: 48 U/L (ref 40–150)
ALT: 15 U/L (ref 0–55)
AST: 19 U/L (ref 5–34)
Anion Gap: 11 mEq/L (ref 3–11)
BILIRUBIN TOTAL: 0.46 mg/dL (ref 0.20–1.20)
BUN: 12.5 mg/dL (ref 7.0–26.0)
CO2: 27 meq/L (ref 22–29)
CREATININE: 0.8 mg/dL (ref 0.6–1.1)
Calcium: 9.4 mg/dL (ref 8.4–10.4)
Chloride: 97 mEq/L — ABNORMAL LOW (ref 98–109)
GLUCOSE: 90 mg/dL (ref 70–140)
Potassium: 3.8 mEq/L (ref 3.5–5.1)
SODIUM: 136 meq/L (ref 136–145)
TOTAL PROTEIN: 6.9 g/dL (ref 6.4–8.3)

## 2017-09-18 MED ORDER — IOPAMIDOL (ISOVUE-300) INJECTION 61%
100.0000 mL | Freq: Once | INTRAVENOUS | Status: AC | PRN
  Administered 2017-09-18: 100 mL via INTRAVENOUS

## 2017-09-18 MED ORDER — HEPARIN SOD (PORK) LOCK FLUSH 100 UNIT/ML IV SOLN
500.0000 [IU] | Freq: Once | INTRAVENOUS | Status: AC
  Administered 2017-09-18: 500 [IU] via INTRAVENOUS

## 2017-09-18 MED ORDER — HEPARIN SOD (PORK) LOCK FLUSH 100 UNIT/ML IV SOLN
INTRAVENOUS | Status: AC
  Filled 2017-09-18: qty 5

## 2017-09-18 MED ORDER — IOPAMIDOL (ISOVUE-300) INJECTION 61%
INTRAVENOUS | Status: AC
  Filled 2017-09-18: qty 100

## 2017-09-20 ENCOUNTER — Ambulatory Visit (HOSPITAL_BASED_OUTPATIENT_CLINIC_OR_DEPARTMENT_OTHER): Payer: Medicare Other

## 2017-09-20 ENCOUNTER — Telehealth: Payer: Self-pay | Admitting: Hematology and Oncology

## 2017-09-20 ENCOUNTER — Encounter: Payer: Self-pay | Admitting: Hematology and Oncology

## 2017-09-20 ENCOUNTER — Ambulatory Visit (HOSPITAL_BASED_OUTPATIENT_CLINIC_OR_DEPARTMENT_OTHER): Payer: Medicare Other | Admitting: Hematology and Oncology

## 2017-09-20 VITALS — HR 94

## 2017-09-20 DIAGNOSIS — C561 Malignant neoplasm of right ovary: Secondary | ICD-10-CM

## 2017-09-20 DIAGNOSIS — J90 Pleural effusion, not elsewhere classified: Secondary | ICD-10-CM | POA: Insufficient documentation

## 2017-09-20 DIAGNOSIS — R531 Weakness: Secondary | ICD-10-CM | POA: Diagnosis not present

## 2017-09-20 DIAGNOSIS — J438 Other emphysema: Secondary | ICD-10-CM

## 2017-09-20 DIAGNOSIS — Z5111 Encounter for antineoplastic chemotherapy: Secondary | ICD-10-CM | POA: Diagnosis present

## 2017-09-20 DIAGNOSIS — J449 Chronic obstructive pulmonary disease, unspecified: Secondary | ICD-10-CM

## 2017-09-20 LAB — CBC WITH DIFFERENTIAL/PLATELET
BASO%: 0.2 % (ref 0.0–2.0)
BASOS ABS: 0 10*3/uL (ref 0.0–0.1)
EOS%: 0 % (ref 0.0–7.0)
Eosinophils Absolute: 0 10*3/uL (ref 0.0–0.5)
HCT: 33.7 % — ABNORMAL LOW (ref 34.8–46.6)
HEMOGLOBIN: 11.2 g/dL — AB (ref 11.6–15.9)
LYMPH%: 12.2 % — ABNORMAL LOW (ref 14.0–49.7)
MCH: 30.7 pg (ref 25.1–34.0)
MCHC: 33.2 g/dL (ref 31.5–36.0)
MCV: 92.3 fL (ref 79.5–101.0)
MONO#: 0.1 10*3/uL (ref 0.1–0.9)
MONO%: 2 % (ref 0.0–14.0)
NEUT%: 85.6 % — ABNORMAL HIGH (ref 38.4–76.8)
NEUTROS ABS: 4.8 10*3/uL (ref 1.5–6.5)
Platelets: 180 10*3/uL (ref 145–400)
RBC: 3.65 10*6/uL — ABNORMAL LOW (ref 3.70–5.45)
RDW: 17.3 % — AB (ref 11.2–14.5)
WBC: 5.6 10*3/uL (ref 3.9–10.3)
lymph#: 0.7 10*3/uL — ABNORMAL LOW (ref 0.9–3.3)

## 2017-09-20 LAB — COMPREHENSIVE METABOLIC PANEL
ALBUMIN: 3.4 g/dL — AB (ref 3.5–5.0)
ALK PHOS: 50 U/L (ref 40–150)
ALT: 17 U/L (ref 0–55)
AST: 18 U/L (ref 5–34)
Anion Gap: 12 mEq/L — ABNORMAL HIGH (ref 3–11)
BUN: 13.4 mg/dL (ref 7.0–26.0)
CO2: 24 mEq/L (ref 22–29)
Calcium: 9.3 mg/dL (ref 8.4–10.4)
Chloride: 99 mEq/L (ref 98–109)
Creatinine: 0.9 mg/dL (ref 0.6–1.1)
EGFR: >60 mL/min/{1.73_m2} (ref >60)
GLUCOSE: 242 mg/dL — AB (ref 70–140)
POTASSIUM: 4 meq/L (ref 3.5–5.1)
SODIUM: 135 meq/L — AB (ref 136–145)
Total Bilirubin: 0.41 mg/dL (ref 0.20–1.20)
Total Protein: 7.2 g/dL (ref 6.4–8.3)

## 2017-09-20 MED ORDER — PALONOSETRON HCL INJECTION 0.25 MG/5ML
INTRAVENOUS | Status: AC
  Filled 2017-09-20: qty 5

## 2017-09-20 MED ORDER — HEPARIN SOD (PORK) LOCK FLUSH 100 UNIT/ML IV SOLN
500.0000 [IU] | Freq: Once | INTRAVENOUS | Status: AC | PRN
  Administered 2017-09-20: 500 [IU]
  Filled 2017-09-20: qty 5

## 2017-09-20 MED ORDER — PALONOSETRON HCL INJECTION 0.25 MG/5ML
0.2500 mg | Freq: Once | INTRAVENOUS | Status: AC
  Administered 2017-09-20: 0.25 mg via INTRAVENOUS

## 2017-09-20 MED ORDER — FOSAPREPITANT DIMEGLUMINE INJECTION 150 MG
Freq: Once | INTRAVENOUS | Status: AC
  Administered 2017-09-20: 11:00:00 via INTRAVENOUS
  Filled 2017-09-20: qty 5

## 2017-09-20 MED ORDER — FAMOTIDINE IN NACL 20-0.9 MG/50ML-% IV SOLN
20.0000 mg | Freq: Once | INTRAVENOUS | Status: AC
  Administered 2017-09-20: 20 mg via INTRAVENOUS

## 2017-09-20 MED ORDER — FAMOTIDINE IN NACL 20-0.9 MG/50ML-% IV SOLN
INTRAVENOUS | Status: AC
  Filled 2017-09-20: qty 50

## 2017-09-20 MED ORDER — SODIUM CHLORIDE 0.9 % IV SOLN
487.2000 mg | Freq: Once | INTRAVENOUS | Status: AC
  Administered 2017-09-20: 490 mg via INTRAVENOUS
  Filled 2017-09-20: qty 49

## 2017-09-20 MED ORDER — SODIUM CHLORIDE 0.9% FLUSH
10.0000 mL | INTRAVENOUS | Status: DC | PRN
  Administered 2017-09-20: 10 mL
  Filled 2017-09-20: qty 10

## 2017-09-20 MED ORDER — DIPHENHYDRAMINE HCL 50 MG/ML IJ SOLN
50.0000 mg | Freq: Once | INTRAMUSCULAR | Status: AC
  Administered 2017-09-20: 50 mg via INTRAVENOUS

## 2017-09-20 MED ORDER — DIPHENHYDRAMINE HCL 50 MG/ML IJ SOLN
INTRAMUSCULAR | Status: AC
  Filled 2017-09-20: qty 1

## 2017-09-20 MED ORDER — SODIUM CHLORIDE 0.9 % IV SOLN
Freq: Once | INTRAVENOUS | Status: AC
  Administered 2017-09-20: 11:00:00 via INTRAVENOUS

## 2017-09-20 MED ORDER — SODIUM CHLORIDE 0.9 % IV SOLN
175.0000 mg/m2 | Freq: Once | INTRAVENOUS | Status: AC
  Administered 2017-09-20: 336 mg via INTRAVENOUS
  Filled 2017-09-20: qty 56

## 2017-09-20 NOTE — Telephone Encounter (Signed)
Scheduled appt per 12/14 los - Gave patient AVS and calender per los.

## 2017-09-20 NOTE — Assessment & Plan Note (Signed)
She is doing very well chemotherapy without major side effects CT scan showed positive response to treatment Tumor markers is improving Recommend GYN consultation to determine whether interval debulking surgery is appropriate I will tentatively schedule cycle 5 of chemotherapy in January if the decision is not to proceed with surgery

## 2017-09-20 NOTE — Patient Instructions (Signed)
[facility] °Discharge Instructions for Patients Receiving Chemotherapy ° °Today you received the following chemotherapy agents Taxol; Carboplatin ° °To help prevent nausea and vomiting after your treatment, we encourage you to take your nausea medication as directed °  °If you develop nausea and vomiting that is not controlled by your nausea medication, call the clinic.  ° °BELOW ARE SYMPTOMS THAT SHOULD BE REPORTED IMMEDIATELY: °· *FEVER GREATER THAN 100.5 F °· *CHILLS WITH OR WITHOUT FEVER °· NAUSEA AND VOMITING THAT IS NOT CONTROLLED WITH YOUR NAUSEA MEDICATION °· *UNUSUAL SHORTNESS OF BREATH °· *UNUSUAL BRUISING OR BLEEDING °· TENDERNESS IN MOUTH AND THROAT WITH OR WITHOUT PRESENCE OF ULCERS °· *URINARY PROBLEMS °· *BOWEL PROBLEMS °· UNUSUAL RASH °Items with * indicate a potential emergency and should be followed up as soon as possible. ° °Feel free to call the clinic should you have any questions or concerns. The clinic phone number is [phone]. ° °Please show the CHEMO ALERT CARD at check-in to the Emergency Department and triage nurse. ° ° °

## 2017-09-20 NOTE — Assessment & Plan Note (Signed)
COPD stable She will continue inhalers as directed and oxygen as needed

## 2017-09-20 NOTE — Progress Notes (Signed)
[place] OFFICE PROGRESS NOTE  Patient Care Team: Asencion Noble, MD as PCP - General (Internal Medicine)  SUMMARY OF ONCOLOGIC HISTORY:   Ovarian CA, right ([place])   02/18/2016 Tumor Marker    Patient's tumor was tested for the following markers: CA125 Results of the tumor marker test revealed 45      05/22/2016 Tumor Marker    Patient's tumor was tested for the following markers: CA125 Results of the tumor marker test revealed 53      05/22/2016 Imaging    Outside pelvic US showed 4.1 cm adnexa mass      06/24/2017 Imaging    Ct abdomen and pelvis:  1. Interim finding of moderate ascites within the abdomen and pelvis with additional finding of diffuse nodular infiltration of the omentum and anterior mesenteric fat, the appearance would be consistent with peritoneal carcinomatosis/metastatic disease. Increasing retroperitoneal and upper abdominal adenopathy. 2. Re- demonstrated 3.8 cm cyst in the right adnexa. Enlarging soft tissue density in the left adnexa now with possible cystic component posteriorly. In light of the above findings, concern is for ovarian neoplasm. Correlation with pelvic ultrasound recommended. 3. Small right-sided pleural effusion, new since prior study 4. Stable hypodense splenic lesions since 2017.       06/25/2017 Imaging    US pelvis: 2.9 cm simple appearing cyst in the right ovary. Left ovary grossly unremarkable. Large volume ascites in the pelvis      06/30/2017 - 07/01/2017 Hospital Admission    She was admitted for evaluation of abdominal pain and ascites      07/01/2017 Pathology Results    PERITONEAL/ASCITIC FLUID(SPECIMEN 1 OF 1 COLLECTED 07/01/17): - POORLY DIFFERENTIATED CARCINOMA; SEE COMMENT Source Peritoneal/Ascitic Fluid, (specimen 1 of 1 collected 07/01/17) Gross Specimen: Received is/are 1000 cc's of brownish fluid. (BS:bs) Prepared: # Smears: 0 # Concentration Technique Slides (i.e. ThinPrep): 1 # Cell Block:  1 Additional Studies: Also received Hematology slide - [id number]. Comment The tumor cells are positive for cytokeratin 7 and Pax-8 but negative for cytokeratin 20, CDX-2, GATA-3, Napsin-A and TTF-1. Based on the immunoprofile a gynecology primary is favored      07/01/2017 Procedure    Successful ultrasound-guided diagnostic and therapeutic paracentesis yielding 2.5 liters of peritoneal fluid      07/07/2017 - 07/09/2017 Hospital Admission    She was admitted for management of malignant ascites      07/08/2017 Procedure    Successful ultrasound-guided therapeutic paracentesis yielding 2.7 liters liters of peritoneal fluid      07/12/2017 Procedure    Successful ultrasound-guided paracentesis yielding 1450 mL of peritoneal fluid      07/18/2017 - 07/24/2017 Hospital Admission    She was admitted for expedited treatment      07/18/2017 Tumor Marker    Patient's tumor was tested for the following markers: CA125 Results of the tumor marker test revealed 1941      07/19/2017 -  Chemotherapy    The patient had palonosetron (ALOXI) injection 0.25 mg, 0.25 mg, Intravenous,  Once, 1 of 6 cycles  pegfilgrastim (NEULASTA) injection 6 mg, 6 mg, Subcutaneous,  Once, 1 of 6 cycles  CARBOplatin (PARAPLATIN) 490 mg in sodium chloride 0.9 % 250 mL chemo infusion, 490 mg (100 % of original dose 487.2 mg), Intravenous,  Once, 1 of 6 cycles Dose modification:   (original dose 487.2 mg, Cycle 1)  PACLitaxel (TAXOL) 336 mg in dextrose 5 % 500 mL chemo infusion (> 35m/m2), 175 mg/m2 = 336 mg, Intravenous,  Once, 1 of 6 cycles  fosaprepitant (EMEND) 150 mg, dexamethasone (DECADRON) 12 mg in sodium chloride 0.9 % 145 mL IVPB, , Intravenous,  Once, 1 of 6 cycles  for chemotherapy treatment.        08/06/2017 Procedure    Successful ultrasound-guided therapeutic paracentesis yielding 2.6 liters of peritoneal fluid.      08/09/2017 Tumor Marker    Patient's tumor was tested for the following markers:  CA125 Results of the tumor marker test revealed 1665      08/15/2017 Tumor Marker    Patient's tumor was tested for the following markers: CA125 Results of the tumor marker test revealed 937.9      08/20/2017 Imaging    ECHO: Normal LV size with EF 60-65%. Normal RV size and systolic function. No significant valvular abnormalities.      09/18/2017 Imaging    Chest Impression:  1. No evidence thoracic metastasis. 2. Interval increase and RIGHT pleural effusion.  Abdomen / Pelvis Impression:  1. Interval decrease in intraperitoneal free fluid. 2. Interval decrease in omental nodularity in the LEFT ventral peritoneal space. 3. Interval decrease in nodularity associated with the LEFT ovary. 4. Cystic portion of the RIGHT ovary is increased mildly in size.       INTERVAL HISTORY: Please see below for problem oriented charting. She returns for cycle 4 of chemotherapy She is doing well She denies peripheral neuropathy No recent nausea or vomiting  Her appetite is good Denies constipation She is getting stronger The plan from skilled nursing facility is for discharge home next week She denies cough, chest pain or shortness of breath  REVIEW OF SYSTEMS:   Constitutional: Denies fevers, chills or abnormal weight loss Eyes: Denies blurriness of vision Ears, nose, mouth, throat, and face: Denies mucositis or sore throat Respiratory: Denies cough, dyspnea or wheezes Cardiovascular: Denies palpitation, chest discomfort or lower extremity swelling Gastrointestinal:  Denies nausea, heartburn or change in bowel habits Skin: Denies abnormal skin rashes Lymphatics: Denies new lymphadenopathy or easy bruising Neurological:Denies numbness, tingling or new weaknesses Behavioral/Psych: Mood is stable, no new changes  All other systems were reviewed with the patient and are negative.  I have reviewed the past medical history, past surgical history, social history and family history with  the patient and they are unchanged from previous note.  ALLERGIES:  is allergic to codeine.  MEDICATIONS:  Current Outpatient Medications  Medication Sig Dispense Refill  . albuterol (PROAIR HFA) 108 (90 Base) MCG/ACT inhaler Inhale 2 puffs into the lungs every 6 (six) hours as needed for wheezing or shortness of breath. 1 Inhaler 3  . ALPRAZolam (XANAX) 0.25 MG tablet Take 1 tablet (0.25 mg total) by mouth at bedtime as needed for anxiety. 60 tablet 0  . azelastine (ASTELIN) 0.1 % nasal spray Place 2 sprays into both nostrils 2 (two) times daily. Use in each nostril as directed    . budesonide-formoterol (SYMBICORT) 160-4.5 MCG/ACT inhaler Inhale 2 puffs into the lungs 2 (two) times daily. 1 Inhaler 3  . cephALEXin (KEFLEX) 500 MG capsule Take 1 capsule (500 mg total) 3 (three) times daily by mouth. 21 capsule 0  . Cholecalciferol (VITAMIN D) 2000 units CAPS Take 2,000 Units by mouth daily.    Marland Kitchen dexamethasone (DECADRON) 4 MG tablet Take 5 tablets at 9 pm the day before chemo and to take 5 tabs at 6 am the day of chemo, every 3 weeks 30 tablet 1  . diltiazem (CARTIA XT) 120 MG 24 hr capsule  TAKE 1 CAPSULE BY MOUTH EVERY DAY 90 capsule 3  . diphenoxylate-atropine (LOMOTIL) 2.5-0.025 MG tablet Take 1 tablet by mouth 4 (four) times daily. (Patient taking differently: Take 1 tablet by mouth 4 (four) times daily as needed for diarrhea or loose stools. ) 360 tablet 1  . fluticasone (FLONASE) 50 MCG/ACT nasal spray Place 2 sprays into both nostrils 2 (two) times daily. For nasal congestion    . furosemide (LASIX) 40 MG tablet TAKE 1 TABLET BY MOUTH DAILY AND 1 TABLET AS NEEDED FOR WEIGHT INCREASE OF 2-3 LBS IN 24 HRS 60 tablet 3  . levothyroxine (SYNTHROID, LEVOTHROID) 137 MCG tablet Take 137 mcg by mouth every morning. For thyroid therapy    . lidocaine-prilocaine (EMLA) cream Apply 1 application topically as needed. 30 g 6  . loperamide (IMODIUM) 2 MG capsule Take 2 capsules (4 mg total) by mouth 3  (three) times daily. 30 capsule 0  . methylcellulose (ARTIFICIAL TEARS) 1 % ophthalmic solution Place 1 drop into both eyes 2 (two) times daily as needed. Dry eyes    . potassium chloride SA (K-DUR,KLOR-CON) 20 MEQ tablet TAKE 1 TABLET BY MOUTH DAILY AND 1 TABLET AS NEEDED WITH EXTRA LASIX DOSE 60 tablet 3  . prochlorperazine (COMPAZINE) 10 MG tablet Take 10 mg by mouth every 6 (six) hours as needed for nausea.  1  . senna-docusate (SENOKOT-S) 8.6-50 MG tablet Take 1 tablet by mouth at bedtime. 30 tablet 0  . XARELTO 20 MG TABS tablet TAKE 1 TABLET BY MOUTH EVERY DAY WITH SUPPER 90 tablet 0   No current facility-administered medications for this visit.    Facility-Administered Medications Ordered in Other Visits  Medication Dose Route Frequency Provider Last Rate Last Dose  . sodium chloride flush (NS) 0.9 % injection 10 mL  10 mL Intracatheter PRN Alvy Bimler, Jakeria Caissie, MD   10 mL at 09/20/17 1636    PHYSICAL EXAMINATION: ECOG PERFORMANCE STATUS: 1 - Symptomatic but completely ambulatory  Vitals:   09/20/17 0919  BP: 130/62  Pulse: (!) 104  Resp: 17  Temp: 97.7 F (36.5 C)  SpO2: 100%   Filed Weights   09/20/17 0919  Weight: 168 lb (76.2 kg)    GENERAL:alert, no distress and comfortable.  She is examined sitting on the wheelchair SKIN: skin color, texture, turgor are normal, no rashes or significant lesions EYES: normal, Conjunctiva are pink and non-injected, sclera clear OROPHARYNX:no exudate, no erythema and lips, buccal mucosa, and tongue normal  NECK: supple, thyroid normal size, non-tender, without nodularity LYMPH:  no palpable lymphadenopathy in the cervical, axillary or inguinal LUNGS: She has reduced breath sounds minimally on the right lung base with normal breathing effort HEART: regular rate & rhythm and no murmurs and no lower extremity edema ABDOMEN:abdomen soft, non-tender and normal bowel sounds Musculoskeletal:no cyanosis of digits and no clubbing  NEURO: alert &  oriented x 3 with fluent speech, no focal motor/sensory deficits  LABORATORY DATA:  I have reviewed the data as listed    Component Value Date/Time   NA 135 (L) 09/20/2017 1011   K 4.0 09/20/2017 1011   CL 94 (L) 08/12/2017 1137   CO2 24 09/20/2017 1011   GLUCOSE 242 (H) 09/20/2017 1011   BUN 13.4 09/20/2017 1011   CREATININE 0.9 09/20/2017 1011   CALCIUM 9.3 09/20/2017 1011   PROT 7.2 09/20/2017 1011   ALBUMIN 3.4 (L) 09/20/2017 1011   AST 18 09/20/2017 1011   ALT 17 09/20/2017 1011   ALKPHOS 50 09/20/2017  1011   BILITOT 0.41 09/20/2017 1011   GFRNONAA >60 07/23/2017 0536   GFRAA >60 07/23/2017 0536    No results found for: SPEP, UPEP  Lab Results  Component Value Date   WBC 5.6 09/20/2017   NEUTROABS 4.8 09/20/2017   HGB 11.2 (L) 09/20/2017   HCT 33.7 (L) 09/20/2017   MCV 92.3 09/20/2017   PLT 180 09/20/2017      Chemistry      Component Value Date/Time   NA 135 (L) 09/20/2017 1011   K 4.0 09/20/2017 1011   CL 94 (L) 08/12/2017 1137   CO2 24 09/20/2017 1011   BUN 13.4 09/20/2017 1011   CREATININE 0.9 09/20/2017 1011      Component Value Date/Time   CALCIUM 9.3 09/20/2017 1011   ALKPHOS 50 09/20/2017 1011   AST 18 09/20/2017 1011   ALT 17 09/20/2017 1011   BILITOT 0.41 09/20/2017 1011       RADIOGRAPHIC STUDIES: I have reviewed imaging study with her and her daughter I have personally reviewed the radiological images as listed and agreed with the findings in the report. Ct Chest W Contrast  Result Date: 09/18/2017 CLINICAL DATA:  Ovarian carcinoma diagnosed 2018. Chemotherapy in progress. Surgery anticipated. EXAM: CT CHEST, ABDOMEN, AND PELVIS WITH CONTRAST TECHNIQUE: Multidetector CT imaging of the chest, abdomen and pelvis was performed following the standard protocol during bolus administration of intravenous contrast. CONTRAST:  168m ISOVUE-300 IOPAMIDOL (ISOVUE-300) INJECTION 61% COMPARISON:  CT 06/24/2017 FINDINGS: CT CHEST FINDINGS  Cardiovascular: Coronary artery calcification and aortic atherosclerotic calcification. Mediastinum/Nodes: No axillary supraclavicular adenopathy no mediastinal adenopathy. Port in the RIGHT chest with tip in distal SVC. Lungs/Pleura: Moderate RIGHT pleural effusion. No suspicious pulmonary nodules. Musculoskeletal: No aggressive osseous lesion. CT ABDOMEN AND PELVIS FINDINGS Hepatobiliary: No focal hepatic lesion. Postcholecystectomy. No biliary dilatation. Pancreas: Pancreas is normal. No ductal dilatation. No pancreatic inflammation. Spleen: Multi small hypodense lesions in the spleen all are unchanged from prior and favored benign cysts or hemangiomas. Adrenals/urinary tract: Adrenal glands are normal. Kidneys, ureters bladder normal. Stomach/Bowel: Stomach, small-bowel cecum normal. Appendix not identified. Colon and rectosigmoid colon normal. Vascular/Lymphatic: Abdominal aorta is normal caliber with atherosclerotic calcification. There is no retroperitoneal or periportal lymphadenopathy. No pelvic lymphadenopathy. Reproductive: Post hysterectomy. Cystic lesion of the RIGHT ovary measures 4.3 cm compared with 3.7 cm. Cyst si simple fluid attenuation (image 101, series 2. Decrease nodularity of the LEFT ovary which now appears normal. Other: There is omental thickening in the LEFT ventral peritoneal space which is decreased in prominence compared to prior. (Image 85, series 2). No new omental or peritoneal nodularity. Musculoskeletal: No aggressive osseous lesion. IMPRESSION: Chest Impression: 1. No evidence thoracic metastasis. 2. Interval increase and RIGHT pleural effusion. Abdomen / Pelvis Impression: 1. Interval decrease in intraperitoneal free fluid. 2. Interval decrease in omental nodularity in the LEFT ventral peritoneal space. 3. Interval decrease in nodularity associated with the LEFT ovary. 4. Cystic portion of the RIGHT ovary is increased mildly in size. Electronically Signed   By: SSuzy Bouchard M.D.   On: 09/18/2017 15:45   Ct Abdomen Pelvis W Contrast  Result Date: 09/18/2017 CLINICAL DATA:  Ovarian carcinoma diagnosed 2018. Chemotherapy in progress. Surgery anticipated. EXAM: CT CHEST, ABDOMEN, AND PELVIS WITH CONTRAST TECHNIQUE: Multidetector CT imaging of the chest, abdomen and pelvis was performed following the standard protocol during bolus administration of intravenous contrast. CONTRAST:  1078mISOVUE-300 IOPAMIDOL (ISOVUE-300) INJECTION 61% COMPARISON:  CT 06/24/2017 FINDINGS: CT CHEST FINDINGS Cardiovascular: Coronary artery  calcification and aortic atherosclerotic calcification. Mediastinum/Nodes: No axillary supraclavicular adenopathy no mediastinal adenopathy. Port in the RIGHT chest with tip in distal SVC. Lungs/Pleura: Moderate RIGHT pleural effusion. No suspicious pulmonary nodules. Musculoskeletal: No aggressive osseous lesion. CT ABDOMEN AND PELVIS FINDINGS Hepatobiliary: No focal hepatic lesion. Postcholecystectomy. No biliary dilatation. Pancreas: Pancreas is normal. No ductal dilatation. No pancreatic inflammation. Spleen: Multi small hypodense lesions in the spleen all are unchanged from prior and favored benign cysts or hemangiomas. Adrenals/urinary tract: Adrenal glands are normal. Kidneys, ureters bladder normal. Stomach/Bowel: Stomach, small-bowel cecum normal. Appendix not identified. Colon and rectosigmoid colon normal. Vascular/Lymphatic: Abdominal aorta is normal caliber with atherosclerotic calcification. There is no retroperitoneal or periportal lymphadenopathy. No pelvic lymphadenopathy. Reproductive: Post hysterectomy. Cystic lesion of the RIGHT ovary measures 4.3 cm compared with 3.7 cm. Cyst si simple fluid attenuation (image 101, series 2. Decrease nodularity of the LEFT ovary which now appears normal. Other: There is omental thickening in the LEFT ventral peritoneal space which is decreased in prominence compared to prior. (Image 85, series 2). No new omental or  peritoneal nodularity. Musculoskeletal: No aggressive osseous lesion. IMPRESSION: Chest Impression: 1. No evidence thoracic metastasis. 2. Interval increase and RIGHT pleural effusion. Abdomen / Pelvis Impression: 1. Interval decrease in intraperitoneal free fluid. 2. Interval decrease in omental nodularity in the LEFT ventral peritoneal space. 3. Interval decrease in nodularity associated with the LEFT ovary. 4. Cystic portion of the RIGHT ovary is increased mildly in size. Electronically Signed   By: Suzy Bouchard M.D.   On: 09/18/2017 15:45    ASSESSMENT & PLAN:  Ovarian CA, right ([place]) She is doing very well chemotherapy without major side effects CT scan showed positive response to treatment Tumor markers is improving Recommend GYN consultation to determine whether interval debulking surgery is appropriate I will tentatively schedule cycle 5 of chemotherapy in January if the decision is not to proceed with surgery  Pleural effusion on right She is not symptomatic from pleural effusion I recommend observation only I do not believe she need thoracentesis  COPD (chronic obstructive pulmonary disease) (HCC) COPD stable She will continue inhalers as directed and oxygen as needed  Generalized weakness Overall, she is improving She will be discharged from skilled nursing facility next week Recommend home physical therapy after discharge and I will consult advanced home care service for nursing visit, symptom management and physical therapy assessment at home   Orders Placed This Encounter  Procedures  . Ambulatory referral to Home Health    Referral Priority:   Routine    Referral Type:   Home Health Care    Referral Reason:   Specialty Services Required    Requested Specialty:   [place]    Number of Visits Requested:   1   All questions were answered. The patient knows to call the clinic with any problems, questions or concerns. No barriers to learning was  detected. I spent 30 minutes counseling the patient face to face. The total time spent in the appointment was 40 minutes and more than 50% was on counseling and review of test results     Heath Lark, MD 09/20/2017 5:04 PM

## 2017-09-20 NOTE — Assessment & Plan Note (Signed)
She is not symptomatic from pleural effusion I recommend observation only I do not believe she need thoracentesis

## 2017-09-20 NOTE — Assessment & Plan Note (Signed)
Overall, she is improving She will be discharged from skilled nursing facility next week Recommend home physical therapy after discharge and I will consult advanced home care service for nursing visit, symptom management and physical therapy assessment at home

## 2017-09-21 LAB — CA 125: Cancer Antigen (CA) 125: 347 U/mL — ABNORMAL HIGH (ref 0.0–38.1)

## 2017-09-23 ENCOUNTER — Ambulatory Visit (HOSPITAL_BASED_OUTPATIENT_CLINIC_OR_DEPARTMENT_OTHER): Payer: Medicare Other

## 2017-09-23 ENCOUNTER — Telehealth: Payer: Self-pay | Admitting: *Deleted

## 2017-09-23 VITALS — BP 108/60 | HR 100 | Temp 98.6°F | Resp 20

## 2017-09-23 DIAGNOSIS — C561 Malignant neoplasm of right ovary: Secondary | ICD-10-CM

## 2017-09-23 DIAGNOSIS — Z5189 Encounter for other specified aftercare: Secondary | ICD-10-CM | POA: Diagnosis not present

## 2017-09-23 MED ORDER — TBO-FILGRASTIM 300 MCG/0.5ML ~~LOC~~ SOSY
PREFILLED_SYRINGE | SUBCUTANEOUS | Status: AC
  Filled 2017-09-23: qty 0.5

## 2017-09-23 MED ORDER — TBO-FILGRASTIM 300 MCG/0.5ML ~~LOC~~ SOSY
300.0000 ug | PREFILLED_SYRINGE | Freq: Once | SUBCUTANEOUS | Status: AC
  Administered 2017-09-23: 300 ug via SUBCUTANEOUS

## 2017-09-23 NOTE — Telephone Encounter (Signed)
Notified of message below

## 2017-09-23 NOTE — Telephone Encounter (Signed)
-----   Message from Heath Lark, MD sent at 09/23/2017  6:49 AM EST ----- Regarding: CA-125 PLs call her/daughter with test results ----- Message ----- From: Interface, Lab In Three Zero One Sent: 09/20/2017  10:51 AM To: Heath Lark, MD

## 2017-09-23 NOTE — Patient Instructions (Signed)
Tbo-Filgrastim injection What is this medicine? TBO-FILGRASTIM (T B O fil GRA stim) is a granulocyte colony-stimulating factor that stimulates the growth of neutrophils, a type of white blood cell important in the body's fight against infection. It is used to reduce the incidence of fever and infection in patients with certain types of cancer who are receiving chemotherapy that affects the bone marrow. This medicine may be used for other purposes; ask your health care provider or pharmacist if you have questions. COMMON BRAND NAME(S): Granix What should I tell my health care provider before I take this medicine? They need to know if you have any of these conditions: -bone scan or tests planned -kidney disease -sickle cell anemia -an unusual or allergic reaction to tbo-filgrastim, filgrastim, pegfilgrastim, other medicines, foods, dyes, or preservatives -pregnant or trying to get pregnant -breast-feeding How should I use this medicine? This medicine is for injection under the skin. If you get this medicine at home, you will be taught how to prepare and give this medicine. Refer to the Instructions for Use that come with your medication packaging. Use exactly as directed. Take your medicine at regular intervals. Do not take your medicine more often than directed. It is important that you put your used needles and syringes in a special sharps container. Do not put them in a trash can. If you do not have a sharps container, call your pharmacist or healthcare provider to get one. Talk to your pediatrician regarding the use of this medicine in children. Special care may be needed. Overdosage: If you think you have taken too much of this medicine contact a poison control center or emergency room at once. NOTE: This medicine is only for you. Do not share this medicine with others. What if I miss a dose? It is important not to miss your dose. Call your doctor or health care professional if you miss a  dose. What may interact with this medicine? This medicine may interact with the following medications: -medicines that may cause a release of neutrophils, such as lithium This list may not describe all possible interactions. Give your health care provider a list of all the medicines, herbs, non-prescription drugs, or dietary supplements you use. Also tell them if you smoke, drink alcohol, or use illegal drugs. Some items may interact with your medicine. What should I watch for while using this medicine? You may need blood work done while you are taking this medicine. What side effects may I notice from receiving this medicine? Side effects that you should report to your doctor or health care professional as soon as possible: -allergic reactions like skin rash, itching or hives, swelling of the face, lips, or tongue -blood in the urine -dark urine -dizziness -fast heartbeat -feeling faint -shortness of breath or breathing problems -signs and symptoms of infection like fever or chills; cough; or sore throat -signs and symptoms of kidney injury like trouble passing urine or change in the amount of urine -stomach or side pain, or pain at the shoulder -sweating -swelling of the legs, ankles, or abdomen -tiredness Side effects that usually do not require medical attention (report to your doctor or health care professional if they continue or are bothersome): -bone pain -headache -muscle pain -vomiting This list may not describe all possible side effects. Call your doctor for medical advice about side effects. You may report side effects to FDA at 1-800-FDA-1088. Where should I keep my medicine? Keep out of the reach of children. Store in a refrigerator between   2 and 8 degrees C (36 and 46 degrees F). Keep in carton to protect from light. Throw away this medicine if it is left out of the refrigerator for more than 5 consecutive days. Throw away any unused medicine after the expiration  date. NOTE: This sheet is a summary. It may not cover all possible information. If you have questions about this medicine, talk to your doctor, pharmacist, or health care provider.  2018 [employer]vier/Gold Standard (2015-11-14 19:07:04)  

## 2017-09-24 ENCOUNTER — Ambulatory Visit (HOSPITAL_BASED_OUTPATIENT_CLINIC_OR_DEPARTMENT_OTHER): Payer: Medicare Other

## 2017-09-24 ENCOUNTER — Ambulatory Visit (HOSPITAL_BASED_OUTPATIENT_CLINIC_OR_DEPARTMENT_OTHER): Payer: Medicare Other | Admitting: Medical

## 2017-09-24 ENCOUNTER — Other Ambulatory Visit: Payer: Self-pay | Admitting: *Deleted

## 2017-09-24 ENCOUNTER — Ambulatory Visit (HOSPITAL_COMMUNITY)
Admission: RE | Admit: 2017-09-24 | Discharge: 2017-09-24 | Disposition: A | Discharge status: Home / Self Care | Payer: No Typology Code available for payment source | Source: Ambulatory Visit | Attending: Medical | Admitting: Medical

## 2017-09-24 VITALS — BP 129/64 | HR 18 | Temp 97.9°F | Wt 167.6 lb

## 2017-09-24 VITALS — BP 117/50 | HR 99 | Temp 98.4°F | Resp 18

## 2017-09-24 DIAGNOSIS — R05 Cough: Secondary | ICD-10-CM | POA: Diagnosis present

## 2017-09-24 DIAGNOSIS — G893 Neoplasm related pain (acute) (chronic): Secondary | ICD-10-CM | POA: Diagnosis not present

## 2017-09-24 DIAGNOSIS — J90 Pleural effusion, not elsewhere classified: Secondary | ICD-10-CM | POA: Insufficient documentation

## 2017-09-24 DIAGNOSIS — R6883 Chills (without fever): Secondary | ICD-10-CM

## 2017-09-24 DIAGNOSIS — R197 Diarrhea, unspecified: Secondary | ICD-10-CM

## 2017-09-24 DIAGNOSIS — Z5189 Encounter for other specified aftercare: Secondary | ICD-10-CM

## 2017-09-24 DIAGNOSIS — R059 Cough, unspecified: Secondary | ICD-10-CM

## 2017-09-24 DIAGNOSIS — C786 Secondary malignant neoplasm of retroperitoneum and peritoneum: Secondary | ICD-10-CM | POA: Diagnosis not present

## 2017-09-24 DIAGNOSIS — R918 Other nonspecific abnormal finding of lung field: Secondary | ICD-10-CM | POA: Insufficient documentation

## 2017-09-24 DIAGNOSIS — R630 Anorexia: Secondary | ICD-10-CM | POA: Diagnosis not present

## 2017-09-24 DIAGNOSIS — C561 Malignant neoplasm of right ovary: Secondary | ICD-10-CM | POA: Diagnosis present

## 2017-09-24 DIAGNOSIS — R110 Nausea: Secondary | ICD-10-CM | POA: Diagnosis not present

## 2017-09-24 LAB — CBC WITH DIFFERENTIAL/PLATELET
BASO%: 0.2 % (ref 0.0–2.0)
Basophils Absolute: 0 10*3/uL (ref 0.0–0.1)
EOS%: 0.5 % (ref 0.0–7.0)
Eosinophils Absolute: 0.1 10*3/uL (ref 0.0–0.5)
HEMATOCRIT: 32.8 % — AB (ref 34.8–46.6)
HGB: 10.8 g/dL — ABNORMAL LOW (ref 11.6–15.9)
LYMPH#: 0.9 10*3/uL (ref 0.9–3.3)
LYMPH%: 4.7 % — ABNORMAL LOW (ref 14.0–49.7)
MCH: 30.6 pg (ref 25.1–34.0)
MCHC: 32.8 g/dL (ref 31.5–36.0)
MCV: 93.2 fL (ref 79.5–101.0)
MONO#: 0.1 10*3/uL (ref 0.1–0.9)
MONO%: 0.6 % (ref 0.0–14.0)
NEUT%: 94 % — AB (ref 38.4–76.8)
NEUTROS ABS: 18.1 10*3/uL — AB (ref 1.5–6.5)
Platelets: 174 10*3/uL (ref 145–400)
RBC: 3.52 10*6/uL — AB (ref 3.70–5.45)
RDW: 18.7 % — ABNORMAL HIGH (ref 11.2–14.5)
WBC: 19.2 10*3/uL — AB (ref 3.9–10.3)

## 2017-09-24 LAB — COMPREHENSIVE METABOLIC PANEL
ALBUMIN: 3.3 g/dL — AB (ref 3.5–5.0)
ALK PHOS: 52 U/L (ref 40–150)
ALT: 12 U/L (ref 0–55)
ANION GAP: 7 meq/L (ref 3–11)
AST: 16 U/L (ref 5–34)
BILIRUBIN TOTAL: 1.13 mg/dL (ref 0.20–1.20)
BUN: 15.3 mg/dL (ref 7.0–26.0)
CALCIUM: 9.1 mg/dL (ref 8.4–10.4)
CO2: 30 mEq/L — ABNORMAL HIGH (ref 22–29)
Chloride: 98 mEq/L (ref 98–109)
Creatinine: 0.8 mg/dL (ref 0.6–1.1)
GLUCOSE: 101 mg/dL (ref 70–140)
Potassium: 3.7 mEq/L (ref 3.5–5.1)
SODIUM: 135 meq/L — AB (ref 136–145)
Total Protein: 6.6 g/dL (ref 6.4–8.3)

## 2017-09-24 LAB — MAGNESIUM: Magnesium: 1.6 mg/dl (ref 1.5–2.5)

## 2017-09-24 MED ORDER — DIPHENOXYLATE-ATROPINE 2.5-0.025 MG PO TABS
2.0000 | ORAL_TABLET | Freq: Once | ORAL | Status: AC
  Administered 2017-09-24: 2 via ORAL

## 2017-09-24 MED ORDER — LEVOFLOXACIN 250 MG PO TABS: 250.0000 mg | ORAL_TABLET | Freq: Every day | ORAL | 0 refills | Status: DC

## 2017-09-24 MED ORDER — TBO-FILGRASTIM 300 MCG/0.5ML ~~LOC~~ SOSY
PREFILLED_SYRINGE | SUBCUTANEOUS | Status: AC
  Filled 2017-09-24: qty 0.5

## 2017-09-24 MED ORDER — TBO-FILGRASTIM 300 MCG/0.5ML ~~LOC~~ SOSY
300.0000 ug | PREFILLED_SYRINGE | Freq: Once | SUBCUTANEOUS | Status: AC
  Administered 2017-09-24: 300 ug via SUBCUTANEOUS

## 2017-09-24 MED ORDER — OXYCODONE HCL 5 MG PO TABS: 5.0000 mg | ORAL_TABLET | Freq: Four times a day (QID) | ORAL | 0 refills | Status: DC | PRN

## 2017-09-24 MED ORDER — DIPHENOXYLATE-ATROPINE 2.5-0.025 MG PO TABS
ORAL_TABLET | ORAL | Status: AC
  Filled 2017-09-24: qty 2

## 2017-09-24 NOTE — Progress Notes (Signed)
[facility] Progress Note   [name] [id number] 1933/05/24 83 y.o.  [name] is managed by Dr. Heath Lark  Actively treated with chemotherapy: yes  Current Therapy: Carboplatin and Taxol  Last Treated: 09/20/2017   Assessment: Plan:    Diarrhea, unspecified type - Plan: diphenoxylate-atropine (LOMOTIL) 2.5-0.025 MG per tablet 2 tablet, Fecal, C-Dff PCR  Cough - Plan: DG Chest 2 View, levofloxacin (LEVAQUIN) 250 MG tablet  Neoplasm related pain - Plan: oxyCODONE (OXY IR/ROXICODONE) 5 MG immediate release tablet   Diarrhea: Patient was given instructions to take back to her rehab facility stating that she can take up to 2 Lomotil 4 times daily as needed for diarrhea.  Productive cough: Patient was referred for chest x-ray.  She was given a prescription for Levaquin 250 mg p.o. once daily for 7 days.  Neoplasm related pain: Patient was given a prescription for oxycodone 5 mg immediate release 1 every 6 hours as needed for pain.  Please see After Visit Summary for patient specific instructions.  Future Appointments  Date Time Provider [place]  10/14/2017  8:45 AM [facility] LAB 2 [facility] None  10/14/2017  9:00 AM [facility] J32 DNS [facility] None  10/14/2017  9:30 AM Heath Lark, MD [facility] None  10/14/2017 10:15 AM [facility] PROCEDURE 1 [facility] None  10/29/2017 10:15 AM [name] T, PA-C [facility]  11/20/2017 11:00 AM [facility] INJECTION ROOM [facility] None  11/20/2017 11:15 AM [facility] INJECTION ROOM [facility] None    Orders Placed This Encounter  Procedures  . Fecal, C-Dff PCR  . DG Chest 2 View       Subjective:   Patient ID:  [name] is a 83 y.o. (DOB [birth year]) female.  Chief Complaint:  Chief Complaint  Patient presents with  . Diarrhea    HPI [name] is an 83 year old female with a diagnosis of an ovarian cancer.  She was noted to have an elevated CA 125 in May 2017.   An ultrasound completed in August 2017 showed a 4.1 cm adnexal mass.  A CT scan completed on 06/24/2017 showed moderate ascites within the abdomen and pelvis with diffuse nodular infiltration of the omentum and anterior mesenteric fat.  The appearance was consistent with a peritoneal carcinomatosis/metastatic disease.  She was also noted to have increasing retroperitoneal and upper abdominal adenopathy.  A 3.8 cm cyst was noted in the right adnexa.  There was also an enlarging soft tissue density in the left adnexa with a possible cystic component.  Small right pleural effusion a small right-sided pleural effusion was noted.  She was ultimately admitted to the hospital in September 2018.  Peritoneal ascites fluid was collected and returned showing a poorly differentiated carcinoma with Emina profile favoring a gynecologic primary.  The patient was begun on chemotherapy on 07/19/2017 with carboplatin and paclitaxel.  CT scans completed on 09/18/2017 showed no evidence of thoracic metastasis with an interval increase in a right pleural effusion.  An interval decrease in intrarectal intraperitoneal free fluid was noted with an interval decrease in omental nodularity in the left ventral peritoneal space and interval decrease in nodularity associated with the left ovary.  The cystic portion of the right ovary had increased in size mildly.  She was last treated with cycle 4-day 1 of carboplatinum and paclitaxel on 09/20/2017.  She reports that she began feeling poorly on Sunday and developed watery diarrhea on Monday.  She is having anorexia.  She also reports having a cough  with thick yellow sputum production.  She was seen by a mid-level provider at the rehab facility where she is staying yesterday.  A chest x-ray was to have been ordered and followed with an antibiotic.  Neither of these were done.  She reports having nausea but no vomiting she is also having chills.  She denies any recent use of antibiotics.  She  describes her diarrhea as being watery and not foul.  She has had no changes in food.  She is taking Lomotil 1 tablet 3 times daily without relief of her symptoms.  She also asked for a refill of her pain medications today.  The patient presents to the office today with her daughter.  Medications: I have reviewed the patient's current medications.  Allergies:  Allergies  Allergen Reactions  . Codeine Itching    Past Medical History:  Diagnosis Date  . COPD (chronic obstructive pulmonary disease) ([place])   . Depression   . Diverticulosis   . Family hx of colon cancer   . Fibromyalgia   . GERD (gastroesophageal reflux disease)   . Hemorrhoids   . Hiatal hernia   . History of shingles   . Hypothyroidism   . IBS (irritable bowel syndrome)   . Lymphocytic colitis   . Osteoporosis   . Ovarian cancer ([place])   . Paroxysmal atrial fibrillation ([facility])   . Psoriatic arthritis ([place])   . Rectal polyp   . Schatzki's ring   . Uterine polyp     Past Surgical History:  Procedure Laterality Date  . CHOLECYSTECTOMY  Dec 04, 2010  . COLONOSCOPY    . FEMUR IM NAIL Left 12/11/2013   Procedure: INTRAMEDULLARY (IM) NAIL FEMORAL;  Surgeon: [name], MD;  Location: [facility];  Service: Orthopedics;  Laterality: Left;  . FRACTURE SURGERY Left 12/11/2013   hip  . IR FLUORO GUIDE PORT INSERTION RIGHT  07/22/2017  . IR PARACENTESIS  07/12/2017  . IR US GUIDE VASC ACCESS RIGHT  07/22/2017  . KNEE SURGERY Left    x 2  . TOTAL KNEE REVISION  08/06/2012   Procedure: TOTAL KNEE REVISION;  Surgeon: [name], MD;  Location: [facility];  Service: Orthopedics;  Laterality: Left;  Left Total Knee Arthroplasty Revision  . Uterine polypectomy      Family History  Problem Relation Age of Onset  . Heart disease Father        pacemaker  . Lung cancer Father        hx of smoker  . Colon cancer Mother        dx in her mid 58's  . Colon cancer Maternal Grandfather   . Depression Sister     Social  History   Socioeconomic History  . Marital status: Divorced    Spouse name: Not on file  . Number of children: 1  . Years of education: 21  . Highest education level: Not on file  Social Needs  . Financial resource strain: Not on file  . Food insecurity - worry: Not on file  . Food insecurity - inability: Not on file  . Transportation needs - medical: Not on file  . Transportation needs - non-medical: Not on file  Occupational History  . Occupation: retired    Fish farm manager: RETIRED  Tobacco Use  . Smoking status: Former Smoker    Packs/day: 1.00    Years: 20.00    Pack years: 20.00    Types: Cigarettes    Last attempt to quit:  10/08/1980    Years since quitting: 36.9  . Smokeless tobacco: Never Used  Substance and Sexual Activity  . Alcohol use: Yes    Comment: rarely, 12-23-15 rarely  . Drug use: No  . Sexual activity: Not Currently  Other Topics Concern  . Not on file  Social History Narrative   Patient lives at home by herself.    Patient is retired.    Patient has 12 th grade education.           Past Medical History, Surgical history, Social history, and Family history were reviewed and updated as appropriate.   Please see review of systems for further details on the patient's review from today.   Review of Systems:  Review of Systems  Constitutional: Positive for chills. Negative for diaphoresis and fever.  HENT: Positive for congestion. Negative for rhinorrhea, sneezing, sore throat and trouble swallowing.   Respiratory: Positive for cough. Negative for choking, chest tightness and shortness of breath.   Cardiovascular: Negative for chest pain.  Gastrointestinal: Positive for diarrhea and nausea. Negative for vomiting.    Objective:   Physical Exam:  BP 129/64 (BP Location: Right Arm, Patient Position: Sitting)   Pulse (!) 18   Temp 97.9 F (36.6 C)   Wt 167 lb 9 oz (76 kg)   SpO2 100%   BMI 28.76 kg/m   ECOG: 1  Physical Exam  Constitutional:  The  patient is an elderly female who is receiving oxygen via nasal cannula.  HENT:  Head: Normocephalic and atraumatic.  Mouth/Throat: Oropharynx is clear and moist. No oropharyngeal exudate.  Cardiovascular: Normal rate and normal heart sounds. Exam reveals no gallop and no friction rub.  No murmur heard. Pulmonary/Chest: Effort normal. No respiratory distress. She has no wheezes. She has no rales.  Decreased breath sounds over the bibasilar areas.  Neurological: She is alert. Coordination (The patient is ambulating with the use of a wheelchair.) abnormal.  Skin: Skin is warm and dry. No rash noted. No erythema.    Lab Review:     Component Value Date/Time   NA 135 (L) 09/24/2017 0945   K 3.7 09/24/2017 0945   CL 94 (L) 08/12/2017 1137   CO2 30 (H) 09/24/2017 0945   GLUCOSE 101 09/24/2017 0945   BUN 15.3 09/24/2017 0945   CREATININE 0.8 09/24/2017 0945   CALCIUM 9.1 09/24/2017 0945   PROT 6.6 09/24/2017 0945   ALBUMIN 3.3 (L) 09/24/2017 0945   AST 16 09/24/2017 0945   ALT 12 09/24/2017 0945   ALKPHOS 52 09/24/2017 0945   BILITOT 1.13 09/24/2017 0945   GFRNONAA >60 07/23/2017 0536   GFRAA >60 07/23/2017 0536       Component Value Date/Time   WBC 19.2 (H) 09/24/2017 0945   WBC 25.3 (H) 07/23/2017 0536   RBC 3.52 (L) 09/24/2017 0945   RBC 4.70 07/23/2017 0536   HGB 10.8 (L) 09/24/2017 0945   HCT 32.8 (L) 09/24/2017 0945   PLT 174 09/24/2017 0945   MCV 93.2 09/24/2017 0945   MCH 30.6 09/24/2017 0945   MCH 29.1 07/23/2017 0536   MCHC 32.8 09/24/2017 0945   MCHC 32.9 07/23/2017 0536   RDW 18.7 (H) 09/24/2017 0945   LYMPHSABS 0.9 09/24/2017 0945   MONOABS 0.1 09/24/2017 0945   EOSABS 0.1 09/24/2017 0945   BASOSABS 0.0 09/24/2017 0945   -------------------------------  Imaging from last 24 hours (if applicable):  Radiology interpretation: Dg Chest 2 View  Result Date: 09/24/2017 CLINICAL DATA:  Cough.  EXAM: CHEST  2 VIEW COMPARISON:  CT 09/18/2017.  Chest x-ray  08/12/2017. FINDINGS: PowerPort catheter with lead tip over the superior vena cava. Heart size normal. No pulmonary venous congestion. Mild bilateral interstitial prominence noted with small bilateral pleural effusions. No pneumothorax. IMPRESSION: 1. PowerPort catheter with lead tip over the superior vena cava. Heart size normal. No pulmonary venous congestion. 2. Mild bilateral interstitial prominence. Mild pneumonitis cannot be excluded. Small bilateral pleural effusions. Electronically Signed   By: Marcello Moores  Register   On: 09/24/2017 12:07   Ct Chest W Contrast  Result Date: 09/18/2017 CLINICAL DATA:  Ovarian carcinoma diagnosed 2018. Chemotherapy in progress. Surgery anticipated. EXAM: CT CHEST, ABDOMEN, AND PELVIS WITH CONTRAST TECHNIQUE: Multidetector CT imaging of the chest, abdomen and pelvis was performed following the standard protocol during bolus administration of intravenous contrast. CONTRAST:  169m ISOVUE-300 IOPAMIDOL (ISOVUE-300) INJECTION 61% COMPARISON:  CT 06/24/2017 FINDINGS: CT CHEST FINDINGS Cardiovascular: Coronary artery calcification and aortic atherosclerotic calcification. Mediastinum/Nodes: No axillary supraclavicular adenopathy no mediastinal adenopathy. Port in the RIGHT chest with tip in distal SVC. Lungs/Pleura: Moderate RIGHT pleural effusion. No suspicious pulmonary nodules. Musculoskeletal: No aggressive osseous lesion. CT ABDOMEN AND PELVIS FINDINGS Hepatobiliary: No focal hepatic lesion. Postcholecystectomy. No biliary dilatation. Pancreas: Pancreas is normal. No ductal dilatation. No pancreatic inflammation. Spleen: Multi small hypodense lesions in the spleen all are unchanged from prior and favored benign cysts or hemangiomas. Adrenals/urinary tract: Adrenal glands are normal. Kidneys, ureters bladder normal. Stomach/Bowel: Stomach, small-bowel cecum normal. Appendix not identified. Colon and rectosigmoid colon normal. Vascular/Lymphatic: Abdominal aorta is normal caliber  with atherosclerotic calcification. There is no retroperitoneal or periportal lymphadenopathy. No pelvic lymphadenopathy. Reproductive: Post hysterectomy. Cystic lesion of the RIGHT ovary measures 4.3 cm compared with 3.7 cm. Cyst si simple fluid attenuation (image 101, series 2. Decrease nodularity of the LEFT ovary which now appears normal. Other: There is omental thickening in the LEFT ventral peritoneal space which is decreased in prominence compared to prior. (Image 85, series 2). No new omental or peritoneal nodularity. Musculoskeletal: No aggressive osseous lesion. IMPRESSION: Chest Impression: 1. No evidence thoracic metastasis. 2. Interval increase and RIGHT pleural effusion. Abdomen / Pelvis Impression: 1. Interval decrease in intraperitoneal free fluid. 2. Interval decrease in omental nodularity in the LEFT ventral peritoneal space. 3. Interval decrease in nodularity associated with the LEFT ovary. 4. Cystic portion of the RIGHT ovary is increased mildly in size. Electronically Signed   By: SSuzy BouchardM.D.   On: 09/18/2017 15:45   Ct Abdomen Pelvis W Contrast  Result Date: 09/18/2017 CLINICAL DATA:  Ovarian carcinoma diagnosed 2018. Chemotherapy in progress. Surgery anticipated. EXAM: CT CHEST, ABDOMEN, AND PELVIS WITH CONTRAST TECHNIQUE: Multidetector CT imaging of the chest, abdomen and pelvis was performed following the standard protocol during bolus administration of intravenous contrast. CONTRAST:  1072mISOVUE-300 IOPAMIDOL (ISOVUE-300) INJECTION 61% COMPARISON:  CT 06/24/2017 FINDINGS: CT CHEST FINDINGS Cardiovascular: Coronary artery calcification and aortic atherosclerotic calcification. Mediastinum/Nodes: No axillary supraclavicular adenopathy no mediastinal adenopathy. Port in the RIGHT chest with tip in distal SVC. Lungs/Pleura: Moderate RIGHT pleural effusion. No suspicious pulmonary nodules. Musculoskeletal: No aggressive osseous lesion. CT ABDOMEN AND PELVIS FINDINGS  Hepatobiliary: No focal hepatic lesion. Postcholecystectomy. No biliary dilatation. Pancreas: Pancreas is normal. No ductal dilatation. No pancreatic inflammation. Spleen: Multi small hypodense lesions in the spleen all are unchanged from prior and favored benign cysts or hemangiomas. Adrenals/urinary tract: Adrenal glands are normal. Kidneys, ureters bladder normal. Stomach/Bowel: Stomach, small-bowel cecum normal. Appendix not identified. Colon and  rectosigmoid colon normal. Vascular/Lymphatic: Abdominal aorta is normal caliber with atherosclerotic calcification. There is no retroperitoneal or periportal lymphadenopathy. No pelvic lymphadenopathy. Reproductive: Post hysterectomy. Cystic lesion of the RIGHT ovary measures 4.3 cm compared with 3.7 cm. Cyst si simple fluid attenuation (image 101, series 2. Decrease nodularity of the LEFT ovary which now appears normal. Other: There is omental thickening in the LEFT ventral peritoneal space which is decreased in prominence compared to prior. (Image 85, series 2). No new omental or peritoneal nodularity. Musculoskeletal: No aggressive osseous lesion. IMPRESSION: Chest Impression: 1. No evidence thoracic metastasis. 2. Interval increase and RIGHT pleural effusion. Abdomen / Pelvis Impression: 1. Interval decrease in intraperitoneal free fluid. 2. Interval decrease in omental nodularity in the LEFT ventral peritoneal space. 3. Interval decrease in nodularity associated with the LEFT ovary. 4. Cystic portion of the RIGHT ovary is increased mildly in size. Electronically Signed   By: Suzy Bouchard M.D.   On: 09/18/2017 15:45

## 2017-09-24 NOTE — Patient Instructions (Signed)
Tbo-Filgrastim injection What is this medicine? TBO-FILGRASTIM (T B O fil GRA stim) is a granulocyte colony-stimulating factor that stimulates the growth of neutrophils, a type of white blood cell important in the body's fight against infection. It is used to reduce the incidence of fever and infection in patients with certain types of cancer who are receiving chemotherapy that affects the bone marrow. This medicine may be used for other purposes; ask your health care provider or pharmacist if you have questions. COMMON BRAND NAME(S): Granix What should I tell my health care provider before I take this medicine? They need to know if you have any of these conditions: -bone scan or tests planned -kidney disease -sickle cell anemia -an unusual or allergic reaction to tbo-filgrastim, filgrastim, pegfilgrastim, other medicines, foods, dyes, or preservatives -pregnant or trying to get pregnant -breast-feeding How should I use this medicine? This medicine is for injection under the skin. If you get this medicine at home, you will be taught how to prepare and give this medicine. Refer to the Instructions for Use that come with your medication packaging. Use exactly as directed. Take your medicine at regular intervals. Do not take your medicine more often than directed. It is important that you put your used needles and syringes in a special sharps container. Do not put them in a trash can. If you do not have a sharps container, call your pharmacist or healthcare provider to get one. Talk to your pediatrician regarding the use of this medicine in children. Special care may be needed. Overdosage: If you think you have taken too much of this medicine contact a poison control center or emergency room at once. NOTE: This medicine is only for you. Do not share this medicine with others. What if I miss a dose? It is important not to miss your dose. Call your doctor or health care professional if you miss a  dose. What may interact with this medicine? This medicine may interact with the following medications: -medicines that may cause a release of neutrophils, such as lithium This list may not describe all possible interactions. Give your health care provider a list of all the medicines, herbs, non-prescription drugs, or dietary supplements you use. Also tell them if you smoke, drink alcohol, or use illegal drugs. Some items may interact with your medicine. What should I watch for while using this medicine? You may need blood work done while you are taking this medicine. What side effects may I notice from receiving this medicine? Side effects that you should report to your doctor or health care professional as soon as possible: -allergic reactions like skin rash, itching or hives, swelling of the face, lips, or tongue -blood in the urine -dark urine -dizziness -fast heartbeat -feeling faint -shortness of breath or breathing problems -signs and symptoms of infection like fever or chills; cough; or sore throat -signs and symptoms of kidney injury like trouble passing urine or change in the amount of urine -stomach or side pain, or pain at the shoulder -sweating -swelling of the legs, ankles, or abdomen -tiredness Side effects that usually do not require medical attention (report to your doctor or health care professional if they continue or are bothersome): -bone pain -headache -muscle pain -vomiting This list may not describe all possible side effects. Call your doctor for medical advice about side effects. You may report side effects to FDA at 1-800-FDA-1088. Where should I keep my medicine? Keep out of the reach of children. Store in a refrigerator between   2 and 8 degrees C (36 and 46 degrees F). Keep in carton to protect from light. Throw away this medicine if it is left out of the refrigerator for more than 5 consecutive days. Throw away any unused medicine after the expiration  date. NOTE: This sheet is a summary. It may not cover all possible information. If you have questions about this medicine, talk to your doctor, pharmacist, or health care provider.  2018 [employer]vier/Gold Standard (2015-11-14 19:07:04)  

## 2017-09-25 ENCOUNTER — Telehealth: Payer: Self-pay | Admitting: Gynecologic Oncology

## 2017-09-25 NOTE — Telephone Encounter (Signed)
Spoke with patient's daughter. Advised her that she is scheduled to meet with Dr. Fermin Schwab on Jan 4 to discuss possible debulking surgery.  Daughter agreeable with the plan.  Advised to call for any questions or concerns.

## 2017-10-11 ENCOUNTER — Ambulatory Visit (HOSPITAL_BASED_OUTPATIENT_CLINIC_OR_DEPARTMENT_OTHER): Payer: Medicare Other

## 2017-10-11 ENCOUNTER — Other Ambulatory Visit: Payer: Self-pay | Admitting: Hematology and Oncology

## 2017-10-11 ENCOUNTER — Ambulatory Visit: Payer: Medicare Other | Attending: Gynecology | Admitting: Gynecology

## 2017-10-11 ENCOUNTER — Encounter: Payer: Self-pay | Admitting: Gynecology

## 2017-10-11 VITALS — BP 124/60 | HR 105 | Temp 97.6°F | Resp 20 | Wt 159.9 lb

## 2017-10-11 DIAGNOSIS — K5190 Ulcerative colitis, unspecified, without complications: Secondary | ICD-10-CM | POA: Diagnosis not present

## 2017-10-11 DIAGNOSIS — L4050 Arthropathic psoriasis, unspecified: Secondary | ICD-10-CM | POA: Diagnosis not present

## 2017-10-11 DIAGNOSIS — K219 Gastro-esophageal reflux disease without esophagitis: Secondary | ICD-10-CM | POA: Diagnosis not present

## 2017-10-11 DIAGNOSIS — J90 Pleural effusion, not elsewhere classified: Secondary | ICD-10-CM | POA: Insufficient documentation

## 2017-10-11 DIAGNOSIS — F329 Major depressive disorder, single episode, unspecified: Secondary | ICD-10-CM | POA: Diagnosis not present

## 2017-10-11 DIAGNOSIS — C569 Malignant neoplasm of unspecified ovary: Secondary | ICD-10-CM | POA: Diagnosis present

## 2017-10-11 DIAGNOSIS — I480 Paroxysmal atrial fibrillation: Secondary | ICD-10-CM | POA: Diagnosis not present

## 2017-10-11 DIAGNOSIS — K589 Irritable bowel syndrome without diarrhea: Secondary | ICD-10-CM | POA: Diagnosis not present

## 2017-10-11 DIAGNOSIS — Z96652 Presence of left artificial knee joint: Secondary | ICD-10-CM | POA: Diagnosis not present

## 2017-10-11 DIAGNOSIS — R300 Dysuria: Secondary | ICD-10-CM | POA: Diagnosis present

## 2017-10-11 DIAGNOSIS — Z7901 Long term (current) use of anticoagulants: Secondary | ICD-10-CM | POA: Diagnosis not present

## 2017-10-11 DIAGNOSIS — C561 Malignant neoplasm of right ovary: Secondary | ICD-10-CM

## 2017-10-11 DIAGNOSIS — R188 Other ascites: Secondary | ICD-10-CM | POA: Insufficient documentation

## 2017-10-11 DIAGNOSIS — Z8619 Personal history of other infectious and parasitic diseases: Secondary | ICD-10-CM | POA: Insufficient documentation

## 2017-10-11 DIAGNOSIS — Z800 Family history of malignant neoplasm of digestive organs: Secondary | ICD-10-CM | POA: Diagnosis not present

## 2017-10-11 DIAGNOSIS — Z9981 Dependence on supplemental oxygen: Secondary | ICD-10-CM | POA: Diagnosis not present

## 2017-10-11 DIAGNOSIS — M797 Fibromyalgia: Secondary | ICD-10-CM | POA: Diagnosis not present

## 2017-10-11 DIAGNOSIS — Z79899 Other long term (current) drug therapy: Secondary | ICD-10-CM | POA: Diagnosis not present

## 2017-10-11 DIAGNOSIS — E039 Hypothyroidism, unspecified: Secondary | ICD-10-CM | POA: Diagnosis not present

## 2017-10-11 DIAGNOSIS — Z87891 Personal history of nicotine dependence: Secondary | ICD-10-CM | POA: Diagnosis not present

## 2017-10-11 DIAGNOSIS — K5790 Diverticulosis of intestine, part unspecified, without perforation or abscess without bleeding: Secondary | ICD-10-CM | POA: Insufficient documentation

## 2017-10-11 DIAGNOSIS — J449 Chronic obstructive pulmonary disease, unspecified: Secondary | ICD-10-CM | POA: Insufficient documentation

## 2017-10-11 LAB — URINALYSIS, MICROSCOPIC - CHCC
BILIRUBIN (URINE): NEGATIVE
COMMENTS:: <2
Glucose: NEGATIVE mg/dL
KETONES: NEGATIVE mg/dL
Leukocyte Esterase: NEGATIVE
NITRITE: NEGATIVE
PH: 6 (ref 4.6–8.0)
Protein: 30 mg/dL
Specific Gravity, Urine: 1.03 (ref 1.003–1.035)
Urobilinogen, UR: 0.2 mg/dL (ref 0.2–1)

## 2017-10-11 MED ORDER — DEXAMETHASONE 4 MG PO TABS: ORAL_TABLET | ORAL | 1 refills | Status: DC

## 2017-10-11 NOTE — Progress Notes (Signed)
Consult Note: Gyn-Onc  Consult was requested by Dr. Lynnette Caffey for the evaluation of [name] 84 y.o. female  CC:  Chief Complaint  Patient presents with  . Malignant neoplasm of ovary, unspecified laterality ([place])     Assessment/Plan: The patient is clearly having a response to current chemotherapy based on the significant fall in her Ca1 25 value as well as considerable improvement of her CT scan findings.  The question as to whether interval debulking would be appropriate at this time is raised.  My concern is her very high risk for surgical complications especially given her poor pulmonary status and poor functional status.  This is reviewed with the patient and her daughter.  The daughter is obviously disappointed that I would not recommend surgery at this time and I explained that the patient is very fragile and that major abdominal surgery could result in significant complications including prolonged intubation, surgical intensive care unit care, and other serious complications.  Therefore I would recommend the patient continue on chemotherapy for at least 2 more cycles and reevaluate at that time.  Hopefully in the meantime she will become stronger and have improvement in her pleural effusion which might result in better pulmonary function.  I did also explained to the patient and her daughter that in some occasions we do not ever go ahead with interval debulking based on a judgment of the patient's overall ability to recover from surgery.   . Interval history: The patient presents with her daughter today for discussion regarding the possibility of pursuing interval surgical debulking.  She has now received 4 cycles of carboplatin and Taxol the last being administered on September 20, 2017.  Throughout her chemotherapy her Ca1 25 is fallen from 1941 units/mL to 347 units/mL.  In addition her CT scan shows essentially resolution of her ascites improvement in her pleural effusion and  reduction in her omental nodularity.  The patient was recently discharged from a skilled nursing facility where she was undergoing rehabilitation.  Currently she is on home oxygen during the day and at night and ambulates using a walker.  She denies any GI or GU or pelvic symptoms.  She is not having any bleeding.  HPI: [name] is an 84 year old woman who was initially seen in consultation at the request of Dr Lynnette Caffey for a 4cm right ovarian cyst and elevated CA 125.  The cyst was first detected on imaging (CT) in 2012 and was approximately 4cm. It was again seen on a CT scan in August, 2017 and was stable in dimension, and described as likely being benign.   Her CA 125 in May 2017 was 45. It was 32 in August, 2017.   She was scheduled for a repeat CA 125 in March, 13, 2018 this was elevated to 133.5.  The patient denied abdominal bloating or early satiety. She had no vaginal bleeding.  Her major complaint is back pain which has been very bad in the past 6 months and she has been regularly on steroid tapers (most recently completed 2 weeks ago). She has received multiple joint injections for this also.  She takes Xarelto for atrial fibrillation. She has ulcerative colitis. Her bouts have been less prominent lately, likely secondary to prednisone use for her back.  CT scan on 02/07/17 was ordered due to the rising CA 125. It showed a normal appearance of uterus. A simple appearing cystic lesion is again seen in the right adnexa which measures 3.8 x 3.7cm. This  remains stable since 2012, consistent with benign etiology. No other masses or inflammatory process identified.  Given her serious medical comorbidities, surgery was not performed for what appeared to be consistent with a benign lesion as the risk were felt to outweigh the benefits.  In August, 2018 she began experiencing increasing abdominal distension and discomfort. On 07/07/17 she was admitted to [facility] for abdominal  distension and SOB. CT imaging showed Interim finding of moderate ascites within the abdomen and pelvis with additional finding of diffuse nodular infiltration of the omentum and anterior mesenteric fat, the appearance would be consistent with peritoneal carcinomatosis/metastatic disease. Increasing retroperitoneal and upper abdominal adenopathy. Re- demonstrated 3.8 cm cyst in the right adnexa. Enlarging soft tissue density in the left adnexa now with possible cystic component posteriorly. In light of the above findings, concern is for ovarian neoplasm. Correlation with pelvic ultrasound recommended. Small right-sided pleural effusion, new since prior study Stable hypodense splenic lesions since 2017.  US guided paracentesis was performed on 07/01/17 which showed adenocarcinoma, favor gynecologic primary.  She has had multiple paracenteses since that time because her ascites continues to accumulate and is symptomatic making it difficult to breath. She now requires O2 supplementation.  Given her multiple comorbidities the patient was treated in a neoadjuvant fashion using carboplatin and Taxol.  Current Meds:  Outpatient Encounter Medications as of 10/11/2017  Medication Sig  . albuterol (PROAIR HFA) 108 (90 Base) MCG/ACT inhaler Inhale 2 puffs into the lungs every 6 (six) hours as needed for wheezing or shortness of breath.  . ALPRAZolam (XANAX) 0.25 MG tablet Take 1 tablet (0.25 mg total) by mouth at bedtime as needed for anxiety.  Marland Kitchen azelastine (ASTELIN) 0.1 % nasal spray Place 2 sprays into both nostrils 2 (two) times daily. Use in each nostril as directed  . budesonide-formoterol (SYMBICORT) 160-4.5 MCG/ACT inhaler Inhale 2 puffs into the lungs 2 (two) times daily.  . Cholecalciferol (VITAMIN D) 2000 units CAPS Take 2,000 Units by mouth daily.  Marland Kitchen dexamethasone (DECADRON) 4 MG tablet Take 5 tablets at 9 pm the day before chemo and to take 5 tabs at 6 am the day of chemo, every 3 weeks  . diltiazem  (CARTIA XT) 120 MG 24 hr capsule TAKE 1 CAPSULE BY MOUTH EVERY DAY  . diphenoxylate-atropine (LOMOTIL) 2.5-0.025 MG tablet Take 1 tablet by mouth 4 (four) times daily. (Patient taking differently: Take 1 tablet by mouth 4 (four) times daily as needed for diarrhea or loose stools. )  . fluticasone (FLONASE) 50 MCG/ACT nasal spray Place 2 sprays into both nostrils 2 (two) times daily. For nasal congestion  . furosemide (LASIX) 40 MG tablet TAKE 1 TABLET BY MOUTH DAILY AND 1 TABLET AS NEEDED FOR WEIGHT INCREASE OF 2-3 LBS IN 24 HRS  . levofloxacin (LEVAQUIN) 250 MG tablet Take 1 tablet (250 mg total) by mouth daily.  Marland Kitchen levothyroxine (SYNTHROID, LEVOTHROID) 137 MCG tablet Take 137 mcg by mouth every morning. For thyroid therapy  . lidocaine-prilocaine (EMLA) cream Apply 1 application topically as needed.  . loperamide (IMODIUM) 2 MG capsule Take 2 capsules (4 mg total) by mouth 3 (three) times daily.  . methylcellulose (ARTIFICIAL TEARS) 1 % ophthalmic solution Place 1 drop into both eyes 2 (two) times daily as needed. Dry eyes  . oxyCODONE (OXY IR/ROXICODONE) 5 MG immediate release tablet Take 1 tablet (5 mg total) by mouth every 6 (six) hours as needed for severe pain.  . potassium chloride SA (K-DUR,KLOR-CON) 20 MEQ tablet TAKE  1 TABLET BY MOUTH DAILY AND 1 TABLET AS NEEDED WITH EXTRA LASIX DOSE  . prochlorperazine (COMPAZINE) 10 MG tablet Take 10 mg by mouth every 6 (six) hours as needed for nausea.  Marland Kitchen senna-docusate (SENOKOT-S) 8.6-50 MG tablet Take 1 tablet by mouth at bedtime.  . sulfamethoxazole-trimethoprim (BACTRIM DS,SEPTRA DS) 800-160 MG tablet TK 1 T PO BID  . XARELTO 20 MG TABS tablet TAKE 1 TABLET BY MOUTH EVERY DAY WITH SUPPER   No facility-administered encounter medications on file as of 10/11/2017.     Allergy:  Allergies  Allergen Reactions  . Codeine Itching    Social Hx:   Social History   Socioeconomic History  . Marital status: Divorced    Spouse name: Not on file  .  Number of children: 1  . Years of education: 76  . Highest education level: Not on file  Social Needs  . Financial resource strain: Not on file  . Food insecurity - worry: Not on file  . Food insecurity - inability: Not on file  . Transportation needs - medical: Not on file  . Transportation needs - non-medical: Not on file  Occupational History  . Occupation: retired    Fish farm manager: RETIRED  Tobacco Use  . Smoking status: Former Smoker    Packs/day: 1.00    Years: 20.00    Pack years: 20.00    Types: Cigarettes    Last attempt to quit: 10/08/1980    Years since quitting: 37.0  . Smokeless tobacco: Never Used  Substance and Sexual Activity  . Alcohol use: Yes    Comment: rarely, 12-23-15 rarely  . Drug use: No  . Sexual activity: Not Currently  Other Topics Concern  . Not on file  Social History Narrative   Patient lives at home by herself.    Patient is retired.    Patient has 12 th grade education.           Past Surgical Hx:  Past Surgical History:  Procedure Laterality Date  . CHOLECYSTECTOMY  Dec 04, 2010  . COLONOSCOPY    . FEMUR IM NAIL Left 12/11/2013   Procedure: INTRAMEDULLARY (IM) NAIL FEMORAL;  Surgeon: [name], MD;  Location: [facility];  Service: Orthopedics;  Laterality: Left;  . FRACTURE SURGERY Left 12/11/2013   hip  . IR FLUORO GUIDE PORT INSERTION RIGHT  07/22/2017  . IR PARACENTESIS  07/12/2017  . IR US GUIDE VASC ACCESS RIGHT  07/22/2017  . KNEE SURGERY Left    x 2  . TOTAL KNEE REVISION  08/06/2012   Procedure: TOTAL KNEE REVISION;  Surgeon: [name], MD;  Location: [facility];  Service: Orthopedics;  Laterality: Left;  Left Total Knee Arthroplasty Revision  . Uterine polypectomy      Past Medical Hx:  Past Medical History:  Diagnosis Date  . COPD (chronic obstructive pulmonary disease) ([place])   . Depression   . Diverticulosis   . Family hx of colon cancer   . Fibromyalgia   . GERD (gastroesophageal reflux disease)   . Hemorrhoids    . Hiatal hernia   . History of shingles   . Hypothyroidism   . IBS (irritable bowel syndrome)   . Lymphocytic colitis   . Osteoporosis   . Ovarian cancer ([place])   . Paroxysmal atrial fibrillation ([facility])   . Psoriatic arthritis ([place])   . Rectal polyp   . Schatzki's ring   . Uterine polyp     Past Gynecological History:  No LMP recorded. Patient is postmenopausal.  Family Hx:  Family History  Problem Relation Age of Onset  . Heart disease Father        pacemaker  . Lung cancer Father        hx of smoker  . Colon cancer Mother        dx in her mid 57's  . Colon cancer Maternal Grandfather   . Depression Sister     Review of Systems:  Constitutional  Feels well,    ENT Normal appearing ears and nares bilaterally Skin/Breast  No rash, sores, jaundice, itching, dryness Cardiovascular  +shortness of breath Pulmonary  Needs O2 Gastro Intestinal  No nausea, vomitting, or diarrhoea. No bright red blood per rectum, no change in bowel movement, or constipation. + distension of abdomen. Occasional pains. Genito Urinary  No frequency, urgency, dysuria, no bleeding Musculo Skeletal  + back pain Neurologic  No weakness, numbness, change in gait,  Psychology  No depression, anxiety, insomnia.   Vitals:  Blood pressure 124/60, pulse (!) 105, temperature 97.6 F (36.4 C), temperature source Oral, resp. rate 20, weight 159 lb 14.4 oz (72.5 kg), SpO2 100 %.  Physical Exam: WD in NAD, on O2 Neck  Supple NROM, without any enlargements.  Lymph Node Survey No cervical supraclavicular or inguinal adenopathy Cardiovascular  Pulse normal rate, regularity and rhythm. S1 and S2 normal.  Lungs: Decreased BS bibasally Skin  No rash/lesions/breakdown  Psychiatry  Alert and oriented to person, place, and time  Abdomen  Normoactive bowel sounds, abdomen soft, non-tender and obese without evidence of hernia. Distended, dull to percuss with ascites. Back No CVA tenderness Genito  Urinary  Vulva/vagina: Normal external female genitalia.  No lesions. No discharge or bleeding.  Bladder/urethra:  No lesions or masses, well supported bladder  Vagina: normal  Cervix: Normal appearing, no lesions.  Uterus:  Small, mobile, no parametrial involvement or nodularity.  Adnexa: no palpable masses. Rectal  deferred Extremities  No bilateral cyanosis, clubbing or edema.   Marti Sleigh, MD  10/11/2017, 10:29 AM

## 2017-10-11 NOTE — Patient Instructions (Addendum)
Plan to have two more cycles of chemotherapy with consideration of surgery at that time.  You will meet with Dr. Skeet Latch at the end of Feb with consideration for possible surgery on March 28.  Please call for any questions or concerns.

## 2017-10-14 ENCOUNTER — Inpatient Hospital Stay: Payer: Medicare Other

## 2017-10-14 ENCOUNTER — Encounter: Payer: Self-pay | Admitting: Hematology and Oncology

## 2017-10-14 ENCOUNTER — Inpatient Hospital Stay (HOSPITAL_BASED_OUTPATIENT_CLINIC_OR_DEPARTMENT_OTHER): Payer: Medicare Other | Admitting: Hematology and Oncology

## 2017-10-14 ENCOUNTER — Telehealth: Payer: Self-pay | Admitting: *Deleted

## 2017-10-14 ENCOUNTER — Inpatient Hospital Stay: Payer: Medicare Other | Attending: Hematology and Oncology

## 2017-10-14 ENCOUNTER — Telehealth: Payer: Self-pay | Admitting: Hematology and Oncology

## 2017-10-14 DIAGNOSIS — R197 Diarrhea, unspecified: Secondary | ICD-10-CM | POA: Insufficient documentation

## 2017-10-14 DIAGNOSIS — Z79899 Other long term (current) drug therapy: Secondary | ICD-10-CM | POA: Diagnosis not present

## 2017-10-14 DIAGNOSIS — C786 Secondary malignant neoplasm of retroperitoneum and peritoneum: Secondary | ICD-10-CM | POA: Diagnosis not present

## 2017-10-14 DIAGNOSIS — C561 Malignant neoplasm of right ovary: Secondary | ICD-10-CM

## 2017-10-14 DIAGNOSIS — R630 Anorexia: Secondary | ICD-10-CM | POA: Diagnosis not present

## 2017-10-14 DIAGNOSIS — D61818 Other pancytopenia: Secondary | ICD-10-CM

## 2017-10-14 DIAGNOSIS — R531 Weakness: Secondary | ICD-10-CM | POA: Insufficient documentation

## 2017-10-14 DIAGNOSIS — I4891 Unspecified atrial fibrillation: Secondary | ICD-10-CM

## 2017-10-14 DIAGNOSIS — R0602 Shortness of breath: Secondary | ICD-10-CM

## 2017-10-14 DIAGNOSIS — J438 Other emphysema: Secondary | ICD-10-CM

## 2017-10-14 DIAGNOSIS — Z7901 Long term (current) use of anticoagulants: Secondary | ICD-10-CM | POA: Insufficient documentation

## 2017-10-14 DIAGNOSIS — F329 Major depressive disorder, single episode, unspecified: Secondary | ICD-10-CM | POA: Insufficient documentation

## 2017-10-14 DIAGNOSIS — I480 Paroxysmal atrial fibrillation: Secondary | ICD-10-CM

## 2017-10-14 DIAGNOSIS — Z7689 Persons encountering health services in other specified circumstances: Secondary | ICD-10-CM

## 2017-10-14 DIAGNOSIS — J90 Pleural effusion, not elsewhere classified: Secondary | ICD-10-CM | POA: Insufficient documentation

## 2017-10-14 DIAGNOSIS — Z5111 Encounter for antineoplastic chemotherapy: Secondary | ICD-10-CM

## 2017-10-14 DIAGNOSIS — R634 Abnormal weight loss: Secondary | ICD-10-CM | POA: Insufficient documentation

## 2017-10-14 DIAGNOSIS — R599 Enlarged lymph nodes, unspecified: Secondary | ICD-10-CM

## 2017-10-14 DIAGNOSIS — J449 Chronic obstructive pulmonary disease, unspecified: Secondary | ICD-10-CM | POA: Insufficient documentation

## 2017-10-14 LAB — CBC WITH DIFFERENTIAL/PLATELET
Abs Granulocyte: 5.1 10*3/uL (ref 1.5–6.5)
BASOS PCT: 0 %
Basophils Absolute: 0 10*3/uL (ref 0.0–0.1)
EOS ABS: 0 10*3/uL (ref 0.0–0.5)
Eosinophils Relative: 0 %
HCT: 33.5 % — ABNORMAL LOW (ref 34.8–46.6)
HEMOGLOBIN: 11.1 g/dL — AB (ref 11.6–15.9)
Lymphocytes Relative: 7 %
Lymphs Abs: 0.4 10*3/uL — ABNORMAL LOW (ref 0.9–3.3)
MCH: 31.5 pg (ref 25.1–34.0)
MCHC: 33.2 g/dL (ref 31.5–36.0)
MCV: 94.8 fL (ref 79.5–101.0)
MONOS PCT: 1 %
Monocytes Absolute: 0.1 10*3/uL (ref 0.1–0.9)
NEUTROS ABS: 5.1 10*3/uL (ref 1.5–6.5)
NEUTROS PCT: 92 %
PLATELETS: 139 10*3/uL — AB (ref 145–400)
RBC: 3.53 MIL/uL — ABNORMAL LOW (ref 3.70–5.45)
RDW: 17.5 % — ABNORMAL HIGH (ref 11.2–16.1)
WBC: 5.6 10*3/uL (ref 3.9–10.3)

## 2017-10-14 LAB — COMPREHENSIVE METABOLIC PANEL
ALK PHOS: 47 U/L (ref 40–150)
ALT: 11 U/L (ref 0–55)
ANION GAP: 7 (ref 3–11)
AST: 13 U/L (ref 5–34)
Albumin: 3.5 g/dL (ref 3.5–5.0)
BILIRUBIN TOTAL: 0.3 mg/dL (ref 0.2–1.2)
BUN: 15 mg/dL (ref 7–26)
CALCIUM: 9.5 mg/dL (ref 8.4–10.4)
CO2: 25 mmol/L (ref 22–29)
Chloride: 101 mmol/L (ref 98–109)
Creatinine, Ser: 1.16 mg/dL — ABNORMAL HIGH (ref 0.60–1.10)
GFR calc non Af Amer: 43 mL/min — ABNORMAL LOW (ref >60)
GFR, EST AFRICAN AMERICAN: 49 mL/min — AB (ref >60)
GLUCOSE: 247 mg/dL — AB (ref 70–140)
Potassium: 4.4 mmol/L (ref 3.3–4.7)
Sodium: 133 mmol/L — ABNORMAL LOW (ref 136–145)
TOTAL PROTEIN: 7.3 g/dL (ref 6.4–8.3)

## 2017-10-14 MED ORDER — ONDANSETRON HCL 8 MG PO TABS: 8.0000 mg | ORAL_TABLET | Freq: Three times a day (TID) | ORAL | 0 refills | Status: DC | PRN

## 2017-10-14 MED ORDER — DIPHENHYDRAMINE HCL 50 MG/ML IJ SOLN
50.0000 mg | Freq: Once | INTRAMUSCULAR | Status: AC
  Administered 2017-10-14: 50 mg via INTRAVENOUS

## 2017-10-14 MED ORDER — SODIUM CHLORIDE 0.9% FLUSH
10.0000 mL | INTRAVENOUS | Status: DC | PRN
  Administered 2017-10-14: 10 mL
  Filled 2017-10-14: qty 10

## 2017-10-14 MED ORDER — DIPHENHYDRAMINE HCL 50 MG/ML IJ SOLN
INTRAMUSCULAR | Status: AC
  Filled 2017-10-14: qty 1

## 2017-10-14 MED ORDER — FAMOTIDINE IN NACL 20-0.9 MG/50ML-% IV SOLN
INTRAVENOUS | Status: AC
  Filled 2017-10-14: qty 50

## 2017-10-14 MED ORDER — SODIUM CHLORIDE 0.9 % IV SOLN
Freq: Once | INTRAVENOUS | Status: AC
  Administered 2017-10-14: 11:00:00 via INTRAVENOUS

## 2017-10-14 MED ORDER — FAMOTIDINE IN NACL 20-0.9 MG/50ML-% IV SOLN
20.0000 mg | Freq: Once | INTRAVENOUS | Status: AC
  Administered 2017-10-14: 20 mg via INTRAVENOUS

## 2017-10-14 MED ORDER — HEPARIN SOD (PORK) LOCK FLUSH 100 UNIT/ML IV SOLN
500.0000 [IU] | Freq: Once | INTRAVENOUS | Status: AC | PRN
  Administered 2017-10-14: 500 [IU]
  Filled 2017-10-14: qty 5

## 2017-10-14 MED ORDER — SODIUM CHLORIDE 0.9 % IV SOLN
441.0000 mg | Freq: Once | INTRAVENOUS | Status: AC
  Administered 2017-10-14: 440 mg via INTRAVENOUS
  Filled 2017-10-14: qty 44

## 2017-10-14 MED ORDER — PACLITAXEL CHEMO INJECTION 300 MG/50ML
175.0000 mg/m2 | Freq: Once | INTRAVENOUS | Status: AC
  Administered 2017-10-14: 336 mg via INTRAVENOUS
  Filled 2017-10-14: qty 56

## 2017-10-14 MED ORDER — SODIUM CHLORIDE 0.9% FLUSH
10.0000 mL | Freq: Once | INTRAVENOUS | Status: AC
  Administered 2017-10-14: 10 mL
  Filled 2017-10-14: qty 10

## 2017-10-14 MED ORDER — PALONOSETRON HCL INJECTION 0.25 MG/5ML
INTRAVENOUS | Status: AC
  Filled 2017-10-14: qty 5

## 2017-10-14 MED ORDER — PALONOSETRON HCL INJECTION 0.25 MG/5ML
0.2500 mg | Freq: Once | INTRAVENOUS | Status: AC
  Administered 2017-10-14: 0.25 mg via INTRAVENOUS

## 2017-10-14 MED ORDER — SODIUM CHLORIDE 0.9 % IV SOLN
Freq: Once | INTRAVENOUS | Status: AC
  Administered 2017-10-14: 11:00:00 via INTRAVENOUS
  Filled 2017-10-14: qty 5

## 2017-10-14 NOTE — Progress Notes (Signed)
[facility] OFFICE PROGRESS NOTE  Patient Care Team: Asencion Noble, MD as PCP - General (Internal Medicine)  SUMMARY OF ONCOLOGIC HISTORY:   Ovarian CA, right ([place])   02/18/2016 Tumor Marker    Patient's tumor was tested for the following markers: CA125 Results of the tumor marker test revealed 45      05/22/2016 Tumor Marker    Patient's tumor was tested for the following markers: CA125 Results of the tumor marker test revealed 53      05/22/2016 Imaging    Outside pelvic US showed 4.1 cm adnexa mass      06/24/2017 Imaging    Ct abdomen and pelvis:  1. Interim finding of moderate ascites within the abdomen and pelvis with additional finding of diffuse nodular infiltration of the omentum and anterior mesenteric fat, the appearance would be consistent with peritoneal carcinomatosis/metastatic disease. Increasing retroperitoneal and upper abdominal adenopathy. 2. Re- demonstrated 3.8 cm cyst in the right adnexa. Enlarging soft tissue density in the left adnexa now with possible cystic component posteriorly. In light of the above findings, concern is for ovarian neoplasm. Correlation with pelvic ultrasound recommended. 3. Small right-sided pleural effusion, new since prior study 4. Stable hypodense splenic lesions since 2017.       06/25/2017 Imaging    US pelvis: 2.9 cm simple appearing cyst in the right ovary. Left ovary grossly unremarkable. Large volume ascites in the pelvis      06/30/2017 - 07/01/2017 Hospital Admission    She was admitted for evaluation of abdominal pain and ascites      07/01/2017 Pathology Results    PERITONEAL/ASCITIC FLUID(SPECIMEN 1 OF 1 COLLECTED 07/01/17): - POORLY DIFFERENTIATED CARCINOMA; SEE COMMENT Source Peritoneal/Ascitic Fluid, (specimen 1 of 1 collected 07/01/17) Gross Specimen: Received is/are 1000 cc's of brownish fluid. (BS:bs) Prepared: # Smears: 0 # Concentration Technique Slides (i.e. ThinPrep): 1 # Cell Block:  1 Additional Studies: Also received Hematology slide - [id number]. Comment The tumor cells are positive for cytokeratin 7 and Pax-8 but negative for cytokeratin 20, CDX-2, GATA-3, Napsin-A and TTF-1. Based on the immunoprofile a gynecology primary is favored      07/01/2017 Procedure    Successful ultrasound-guided diagnostic and therapeutic paracentesis yielding 2.5 liters of peritoneal fluid      07/07/2017 - 07/09/2017 Hospital Admission    She was admitted for management of malignant ascites      07/08/2017 Procedure    Successful ultrasound-guided therapeutic paracentesis yielding 2.7 liters liters of peritoneal fluid      07/12/2017 Procedure    Successful ultrasound-guided paracentesis yielding 1450 mL of peritoneal fluid      07/18/2017 - 07/24/2017 Hospital Admission    She was admitted for expedited treatment      07/18/2017 Tumor Marker    Patient's tumor was tested for the following markers: CA125 Results of the tumor marker test revealed 1941      07/19/2017 -  Chemotherapy    The patient had palonosetron (ALOXI) injection 0.25 mg, 0.25 mg, Intravenous,  Once, 1 of 6 cycles  pegfilgrastim (NEULASTA) injection 6 mg, 6 mg, Subcutaneous,  Once, 1 of 6 cycles  CARBOplatin (PARAPLATIN) 490 mg in sodium chloride 0.9 % 250 mL chemo infusion, 490 mg (100 % of original dose 487.2 mg), Intravenous,  Once, 1 of 6 cycles Dose modification:   (original dose 487.2 mg, Cycle 1)  PACLitaxel (TAXOL) 336 mg in dextrose 5 % 500 mL chemo infusion (> 42m/m2), 175 mg/m2 = 336 mg, Intravenous,  Once, 1 of 6 cycles  fosaprepitant (EMEND) 150 mg, dexamethasone (DECADRON) 12 mg in sodium chloride 0.9 % 145 mL IVPB, , Intravenous,  Once, 1 of 6 cycles  for chemotherapy treatment.        08/06/2017 Procedure    Successful ultrasound-guided therapeutic paracentesis yielding 2.6 liters of peritoneal fluid.      08/09/2017 Tumor Marker    Patient's tumor was tested for the following markers:  CA125 Results of the tumor marker test revealed 1665      08/15/2017 Tumor Marker    Patient's tumor was tested for the following markers: CA125 Results of the tumor marker test revealed 937.9      08/20/2017 Imaging    ECHO: Normal LV size with EF 60-65%. Normal RV size and systolic function. No significant valvular abnormalities.      09/18/2017 Imaging    Chest Impression:  1. No evidence thoracic metastasis. 2. Interval increase and RIGHT pleural effusion.  Abdomen / Pelvis Impression:  1. Interval decrease in intraperitoneal free fluid. 2. Interval decrease in omental nodularity in the LEFT ventral peritoneal space. 3. Interval decrease in nodularity associated with the LEFT ovary. 4. Cystic portion of the RIGHT ovary is increased mildly in size.      09/20/2017 Tumor Marker    Patient's tumor was tested for the following markers: CA125 Results of the tumor marker test revealed 347       INTERVAL HISTORY: Please see below for problem oriented charting. She is here accompanied by her daughter, to be seen prior to cycle 5 of chemotherapy She saw  GYN surgeon last week who felt that she is too weak to undergo debulking surgery She has poor appetite and has lost some weight She have diarrhea for a few days after last cycle of treatment She has generalized weakness and has missed some physical therapy She has shortness of breath on minimal exertion Between the patient and her daughter, they are trying to decide whether she is willing to pay to get long-term care at home She denies recent fever or chills No peripheral neuropathy Denies abdominal bloating  REVIEW OF SYSTEMS:   Constitutional: Denies fevers, chills  Eyes: Denies blurriness of vision Ears, nose, mouth, throat, and face: Denies mucositis or sore throat Cardiovascular: Denies palpitation, chest discomfort or lower extremity swelling Skin: Denies abnormal skin rashes Lymphatics: Denies new lymphadenopathy  or easy bruising Neurological:Denies numbness, tingling  Behavioral/Psych: Mood is stable, no new changes  All other systems were reviewed with the patient and are negative.  I have reviewed the past medical history, past surgical history, social history and family history with the patient and they are unchanged from previous note.  ALLERGIES:  is allergic to codeine.  MEDICATIONS:  Current Outpatient Medications  Medication Sig Dispense Refill  . albuterol (PROAIR HFA) 108 (90 Base) MCG/ACT inhaler Inhale 2 puffs into the lungs every 6 (six) hours as needed for wheezing or shortness of breath. 1 Inhaler 3  . ALPRAZolam (XANAX) 0.25 MG tablet Take 1 tablet (0.25 mg total) by mouth at bedtime as needed for anxiety. 60 tablet 0  . azelastine (ASTELIN) 0.1 % nasal spray Place 2 sprays into both nostrils 2 (two) times daily. Use in each nostril as directed    . budesonide-formoterol (SYMBICORT) 160-4.5 MCG/ACT inhaler Inhale 2 puffs into the lungs 2 (two) times daily. 1 Inhaler 3  . Cholecalciferol (VITAMIN D) 2000 units CAPS Take 2,000 Units by mouth daily.    Marland Kitchen dexamethasone (DECADRON)  4 MG tablet Take 5 tablets at 9 pm the day before chemo and to take 5 tabs at 6 am the day of chemo, every 3 weeks 30 tablet 1  . diltiazem (CARTIA XT) 120 MG 24 hr capsule TAKE 1 CAPSULE BY MOUTH EVERY DAY 90 capsule 3  . diphenoxylate-atropine (LOMOTIL) 2.5-0.025 MG tablet Take 1 tablet by mouth 4 (four) times daily. (Patient taking differently: Take 1 tablet by mouth 4 (four) times daily as needed for diarrhea or loose stools. ) 360 tablet 1  . fluticasone (FLONASE) 50 MCG/ACT nasal spray Place 2 sprays into both nostrils 2 (two) times daily. For nasal congestion    . furosemide (LASIX) 40 MG tablet TAKE 1 TABLET BY MOUTH DAILY AND 1 TABLET AS NEEDED FOR WEIGHT INCREASE OF 2-3 LBS IN 24 HRS 60 tablet 3  . levothyroxine (SYNTHROID, LEVOTHROID) 137 MCG tablet Take 137 mcg by mouth every morning. For thyroid  therapy    . lidocaine-prilocaine (EMLA) cream Apply 1 application topically as needed. 30 g 6  . loperamide (IMODIUM) 2 MG capsule Take 2 capsules (4 mg total) by mouth 3 (three) times daily. 30 capsule 0  . methylcellulose (ARTIFICIAL TEARS) 1 % ophthalmic solution Place 1 drop into both eyes 2 (two) times daily as needed. Dry eyes    . ondansetron (ZOFRAN) 8 MG tablet Take 1 tablet (8 mg total) by mouth every 8 (eight) hours as needed for nausea or vomiting. 60 tablet 0  . oxyCODONE (OXY IR/ROXICODONE) 5 MG immediate release tablet Take 1 tablet (5 mg total) by mouth every 6 (six) hours as needed for severe pain. 60 tablet 0  . potassium chloride SA (K-DUR,KLOR-CON) 20 MEQ tablet TAKE 1 TABLET BY MOUTH DAILY AND 1 TABLET AS NEEDED WITH EXTRA LASIX DOSE 60 tablet 3  . prochlorperazine (COMPAZINE) 10 MG tablet Take 10 mg by mouth every 6 (six) hours as needed for nausea.  1  . senna-docusate (SENOKOT-S) 8.6-50 MG tablet Take 1 tablet by mouth at bedtime. 30 tablet 0  . XARELTO 20 MG TABS tablet TAKE 1 TABLET BY MOUTH EVERY DAY WITH SUPPER 90 tablet 0   No current facility-administered medications for this visit.    Facility-Administered Medications Ordered in Other Visits  Medication Dose Route Frequency Provider Last Rate Last Dose  . CARBOplatin (PARAPLATIN) 440 mg in sodium chloride 0.9 % 250 mL chemo infusion  440 mg Intravenous Once Alvy Bimler, Kiesha Ensey, MD      . heparin lock flush 100 unit/mL  500 Units Intracatheter Once PRN Alvy Bimler, Sarh Kirschenbaum, MD      . PACLitaxel (TAXOL) 336 mg in sodium chloride 0.9 % 500 mL chemo infusion (> 55m/m2)  175 mg/m2 (Treatment Plan Recorded) Intravenous Once GHeath Lark MD 185 mL/hr at 10/14/17 1206 336 mg at 10/14/17 1206  . sodium chloride flush (NS) 0.9 % injection 10 mL  10 mL Intracatheter PRN GAlvy Bimler Bassy Fetterly, MD        PHYSICAL EXAMINATION: ECOG PERFORMANCE STATUS: 2 - Symptomatic, <50% confined to bed  Vitals:   10/14/17 0957  BP: 136/66  Pulse: 95  Resp: 18   SpO2: 99%   Filed Weights   10/14/17 0957  Weight: 161 lb 6.4 oz (73.2 kg)    GENERAL:alert, no distress and comfortable.  She looks debilitated, sitting on the wheelchair with oxygen delivered via nasal cannula SKIN: skin color, texture, turgor are normal, no rashes or significant lesions EYES: normal, Conjunctiva are pink and non-injected, sclera clear OROPHARYNX:no exudate, no  erythema and lips, buccal mucosa, and tongue normal  NECK: supple, thyroid normal size, non-tender, without nodularity LYMPH:  no palpable lymphadenopathy in the cervical, axillary or inguinal LUNGS: clear to auscultation and percussion with normal breathing effort HEART: regular rate & rhythm and no murmurs and no lower extremity edema ABDOMEN:abdomen soft, non-tender and normal bowel sounds Musculoskeletal:no cyanosis of digits and no clubbing  NEURO: alert & oriented x 3 with fluent speech, no focal motor/sensory deficits  LABORATORY DATA:  I have reviewed the data as listed    Component Value Date/Time   NA 133 (L) 10/14/2017 0838   NA 135 (L) 09/24/2017 0945   K 4.4 10/14/2017 0838   K 3.7 09/24/2017 0945   CL 101 10/14/2017 0838   CO2 25 10/14/2017 0838   CO2 30 (H) 09/24/2017 0945   GLUCOSE 247 (H) 10/14/2017 0838   GLUCOSE 101 09/24/2017 0945   BUN 15 10/14/2017 0838   BUN 15.3 09/24/2017 0945   CREATININE 1.16 (H) 10/14/2017 0838   CREATININE 0.8 09/24/2017 0945   CALCIUM 9.5 10/14/2017 0838   CALCIUM 9.1 09/24/2017 0945   PROT 7.3 10/14/2017 0838   PROT 6.6 09/24/2017 0945   ALBUMIN 3.5 10/14/2017 0838   ALBUMIN 3.3 (L) 09/24/2017 0945   AST 13 10/14/2017 0838   AST 16 09/24/2017 0945   ALT 11 10/14/2017 0838   ALT 12 09/24/2017 0945   ALKPHOS 47 10/14/2017 0838   ALKPHOS 52 09/24/2017 0945   BILITOT 0.3 10/14/2017 0838   BILITOT 1.13 09/24/2017 0945   GFRNONAA 43 (L) 10/14/2017 0838   GFRAA 49 (L) 10/14/2017 0838    No results found for: SPEP, UPEP  Lab Results  Component  Value Date   WBC 5.6 10/14/2017   NEUTROABS 5.1 10/14/2017   HGB 11.1 (L) 10/14/2017   HCT 33.5 (L) 10/14/2017   MCV 94.8 10/14/2017   PLT 139 (L) 10/14/2017      Chemistry      Component Value Date/Time   NA 133 (L) 10/14/2017 0838   NA 135 (L) 09/24/2017 0945   K 4.4 10/14/2017 0838   K 3.7 09/24/2017 0945   CL 101 10/14/2017 0838   CO2 25 10/14/2017 0838   CO2 30 (H) 09/24/2017 0945   BUN 15 10/14/2017 0838   BUN 15.3 09/24/2017 0945   CREATININE 1.16 (H) 10/14/2017 0838   CREATININE 0.8 09/24/2017 0945      Component Value Date/Time   CALCIUM 9.5 10/14/2017 0838   CALCIUM 9.1 09/24/2017 0945   ALKPHOS 47 10/14/2017 0838   ALKPHOS 52 09/24/2017 0945   AST 13 10/14/2017 0838   AST 16 09/24/2017 0945   ALT 11 10/14/2017 0838   ALT 12 09/24/2017 0945   BILITOT 0.3 10/14/2017 0838   BILITOT 1.13 09/24/2017 0945       RADIOGRAPHIC STUDIES: I have personally reviewed the radiological images as listed and agreed with the findings in the report. Dg Chest 2 View  Result Date: 09/24/2017 CLINICAL DATA:  Cough. EXAM: CHEST  2 VIEW COMPARISON:  CT 09/18/2017.  Chest x-ray 08/12/2017. FINDINGS: PowerPort catheter with lead tip over the superior vena cava. Heart size normal. No pulmonary venous congestion. Mild bilateral interstitial prominence noted with small bilateral pleural effusions. No pneumothorax. IMPRESSION: 1. PowerPort catheter with lead tip over the superior vena cava. Heart size normal. No pulmonary venous congestion. 2. Mild bilateral interstitial prominence. Mild pneumonitis cannot be excluded. Small bilateral pleural effusions. Electronically Signed   By: Marcello Moores  Register   On: 09/24/2017 12:07   Ct Chest W Contrast  Result Date: 09/18/2017 CLINICAL DATA:  Ovarian carcinoma diagnosed 2018. Chemotherapy in progress. Surgery anticipated. EXAM: CT CHEST, ABDOMEN, AND PELVIS WITH CONTRAST TECHNIQUE: Multidetector CT imaging of the chest, abdomen and pelvis was  performed following the standard protocol during bolus administration of intravenous contrast. CONTRAST:  134m ISOVUE-300 IOPAMIDOL (ISOVUE-300) INJECTION 61% COMPARISON:  CT 06/24/2017 FINDINGS: CT CHEST FINDINGS Cardiovascular: Coronary artery calcification and aortic atherosclerotic calcification. Mediastinum/Nodes: No axillary supraclavicular adenopathy no mediastinal adenopathy. Port in the RIGHT chest with tip in distal SVC. Lungs/Pleura: Moderate RIGHT pleural effusion. No suspicious pulmonary nodules. Musculoskeletal: No aggressive osseous lesion. CT ABDOMEN AND PELVIS FINDINGS Hepatobiliary: No focal hepatic lesion. Postcholecystectomy. No biliary dilatation. Pancreas: Pancreas is normal. No ductal dilatation. No pancreatic inflammation. Spleen: Multi small hypodense lesions in the spleen all are unchanged from prior and favored benign cysts or hemangiomas. Adrenals/urinary tract: Adrenal glands are normal. Kidneys, ureters bladder normal. Stomach/Bowel: Stomach, small-bowel cecum normal. Appendix not identified. Colon and rectosigmoid colon normal. Vascular/Lymphatic: Abdominal aorta is normal caliber with atherosclerotic calcification. There is no retroperitoneal or periportal lymphadenopathy. No pelvic lymphadenopathy. Reproductive: Post hysterectomy. Cystic lesion of the RIGHT ovary measures 4.3 cm compared with 3.7 cm. Cyst si simple fluid attenuation (image 101, series 2. Decrease nodularity of the LEFT ovary which now appears normal. Other: There is omental thickening in the LEFT ventral peritoneal space which is decreased in prominence compared to prior. (Image 85, series 2). No new omental or peritoneal nodularity. Musculoskeletal: No aggressive osseous lesion. IMPRESSION: Chest Impression: 1. No evidence thoracic metastasis. 2. Interval increase and RIGHT pleural effusion. Abdomen / Pelvis Impression: 1. Interval decrease in intraperitoneal free fluid. 2. Interval decrease in omental nodularity  in the LEFT ventral peritoneal space. 3. Interval decrease in nodularity associated with the LEFT ovary. 4. Cystic portion of the RIGHT ovary is increased mildly in size. Electronically Signed   By: SSuzy BouchardM.D.   On: 09/18/2017 15:45   Ct Abdomen Pelvis W Contrast  Result Date: 09/18/2017 CLINICAL DATA:  Ovarian carcinoma diagnosed 2018. Chemotherapy in progress. Surgery anticipated. EXAM: CT CHEST, ABDOMEN, AND PELVIS WITH CONTRAST TECHNIQUE: Multidetector CT imaging of the chest, abdomen and pelvis was performed following the standard protocol during bolus administration of intravenous contrast. CONTRAST:  1036mISOVUE-300 IOPAMIDOL (ISOVUE-300) INJECTION 61% COMPARISON:  CT 06/24/2017 FINDINGS: CT CHEST FINDINGS Cardiovascular: Coronary artery calcification and aortic atherosclerotic calcification. Mediastinum/Nodes: No axillary supraclavicular adenopathy no mediastinal adenopathy. Port in the RIGHT chest with tip in distal SVC. Lungs/Pleura: Moderate RIGHT pleural effusion. No suspicious pulmonary nodules. Musculoskeletal: No aggressive osseous lesion. CT ABDOMEN AND PELVIS FINDINGS Hepatobiliary: No focal hepatic lesion. Postcholecystectomy. No biliary dilatation. Pancreas: Pancreas is normal. No ductal dilatation. No pancreatic inflammation. Spleen: Multi small hypodense lesions in the spleen all are unchanged from prior and favored benign cysts or hemangiomas. Adrenals/urinary tract: Adrenal glands are normal. Kidneys, ureters bladder normal. Stomach/Bowel: Stomach, small-bowel cecum normal. Appendix not identified. Colon and rectosigmoid colon normal. Vascular/Lymphatic: Abdominal aorta is normal caliber with atherosclerotic calcification. There is no retroperitoneal or periportal lymphadenopathy. No pelvic lymphadenopathy. Reproductive: Post hysterectomy. Cystic lesion of the RIGHT ovary measures 4.3 cm compared with 3.7 cm. Cyst si simple fluid attenuation (image 101, series 2. Decrease  nodularity of the LEFT ovary which now appears normal. Other: There is omental thickening in the LEFT ventral peritoneal space which is decreased in prominence compared to prior. (Image 85, series 2). No new omental  or peritoneal nodularity. Musculoskeletal: No aggressive osseous lesion. IMPRESSION: Chest Impression: 1. No evidence thoracic metastasis. 2. Interval increase and RIGHT pleural effusion. Abdomen / Pelvis Impression: 1. Interval decrease in intraperitoneal free fluid. 2. Interval decrease in omental nodularity in the LEFT ventral peritoneal space. 3. Interval decrease in nodularity associated with the LEFT ovary. 4. Cystic portion of the RIGHT ovary is increased mildly in size. Electronically Signed   By: Suzy Bouchard M.D.   On: 09/18/2017 15:45    ASSESSMENT & PLAN:  Ovarian CA, right ([place]) She is doing very well chemotherapy without major side effects CT scan showed positive response to treatment Tumor marker is improving Due to her other comorbidities, the plan is to proceed for total 6 cycles of neoadjuvant chemotherapy before interval debulking surgery The patient did not like the side effects of G-CSF She would like to reduce it to 1 day injection, to be given on day 3 which I think is reasonable, given the fact that she has never developed infectious complications  Atrial fibrillation ([place]) She is rate controlled and on anticoagulation therapy Continue medical management  COPD (chronic obstructive pulmonary disease) ([place]) She has mild worsening shortness of breath since discharge from skilled nursing facility She will continue inhalers as directed and oxygen as needed I recommend return appointment to see her pulmonologist for medical optimization  Pancytopenia, acquired [facility]) She has acquired pancytopenia She does not need blood transfusion She will continue G-CSF support after each cycle of treatment  Generalized weakness She has missed a lot of physical therapy  since discharge from skilled nursing facility I will send referral to outpatient therapy   No orders of the defined types were placed in this encounter.  All questions were answered. The patient knows to call the clinic with any problems, questions or concerns. No barriers to learning was detected. I spent 25 minutes counseling the patient face to face. The total time spent in the appointment was 30 minutes and more than 50% was on counseling and review of test results     Heath Lark, MD 10/14/2017 1:47 PM

## 2017-10-14 NOTE — Assessment & Plan Note (Signed)
She has missed a lot of physical therapy since discharge from skilled nursing facility I will send referral to outpatient therapy

## 2017-10-14 NOTE — Assessment & Plan Note (Signed)
She has mild worsening shortness of breath since discharge from skilled nursing facility She will continue inhalers as directed and oxygen as needed I recommend return appointment to see her pulmonologist for medical optimization

## 2017-10-14 NOTE — Telephone Encounter (Signed)
Prescription faxed to [facility] for PT assessment, evaluation and treatment

## 2017-10-14 NOTE — Telephone Encounter (Signed)
Gave patient avs and calendar with appts per 1/7 los.

## 2017-10-14 NOTE — Patient Instructions (Signed)
[facility] Discharge Instructions for Patients Receiving Chemotherapy  Today you received the following chemotherapy agents Taxol and carboplatin  To help prevent nausea and vomiting after your treatment, we encourage you to take your nausea medication as directed   If you develop nausea and vomiting that is not controlled by your nausea medication, call the clinic.   BELOW ARE SYMPTOMS THAT SHOULD BE REPORTED IMMEDIATELY:  *FEVER GREATER THAN 100.5 F  *CHILLS WITH OR WITHOUT FEVER  NAUSEA AND VOMITING THAT IS NOT CONTROLLED WITH YOUR NAUSEA MEDICATION  *UNUSUAL SHORTNESS OF BREATH  *UNUSUAL BRUISING OR BLEEDING  TENDERNESS IN MOUTH AND THROAT WITH OR WITHOUT PRESENCE OF ULCERS  *URINARY PROBLEMS  *BOWEL PROBLEMS  UNUSUAL RASH Items with * indicate a potential emergency and should be followed up as soon as possible.  Feel free to call the clinic should you have any questions or concerns. The clinic phone number is [phone].  Please show the CHEMO ALERT CARD at check-in to the Emergency Department and triage nurse.   

## 2017-10-14 NOTE — Patient Instructions (Signed)
Implanted Port Home Guide An implanted port is a type of central line that is placed under the skin. Central lines are used to provide IV access when treatment or nutrition needs to be given through a person's veins. Implanted ports are used for long-term IV access. An implanted port may be placed because:  You need IV medicine that would be irritating to the small veins in your hands or arms.  You need long-term IV medicines, such as antibiotics.  You need IV nutrition for a long period.  You need frequent blood draws for lab tests.  You need dialysis.  Implanted ports are usually placed in the chest area, but they can also be placed in the upper arm, the abdomen, or the leg. An implanted port has two main parts:  Reservoir. The reservoir is round and will appear as a small, raised area under your skin. The reservoir is the part where a needle is inserted to give medicines or draw blood.  Catheter. The catheter is a thin, flexible tube that extends from the reservoir. The catheter is placed into a large vein. Medicine that is inserted into the reservoir goes into the catheter and then into the vein.  How will I care for my incision site? Do not get the incision site wet. Bathe or shower as directed by your health care provider. How is my port accessed? Special steps must be taken to access the port:  Before the port is accessed, a numbing cream can be placed on the skin. This helps numb the skin over the port site.  Your health care provider uses a sterile technique to access the port. ? Your health care provider must put on a mask and sterile gloves. ? The skin over your port is cleaned carefully with an antiseptic and allowed to dry. ? The port is gently pinched between sterile gloves, and a needle is inserted into the port.  Only "non-coring" port needles should be used to access the port. Once the port is accessed, a blood return should be checked. This helps ensure that the port  is in the vein and is not clogged.  If your port needs to remain accessed for a constant infusion, a clear (transparent) bandage will be placed over the needle site. The bandage and needle will need to be changed every week, or as directed by your health care provider.  Keep the bandage covering the needle clean and dry. Do not get it wet. Follow your health care provider's instructions on how to take a shower or bath while the port is accessed.  If your port does not need to stay accessed, no bandage is needed over the port.  What is flushing? Flushing helps keep the port from getting clogged. Follow your health care provider's instructions on how and when to flush the port. Ports are usually flushed with saline solution or a medicine called heparin. The need for flushing will depend on how the port is used.  If the port is used for intermittent medicines or blood draws, the port will need to be flushed: ? After medicines have been given. ? After blood has been drawn. ? As part of routine maintenance.  If a constant infusion is running, the port may not need to be flushed.  How long will my port stay implanted? The port can stay in for as long as your health care provider thinks it is needed. When it is time for the port to come out, surgery will be   done to remove it. The procedure is similar to the one performed when the port was put in. When should I seek immediate medical care? When you have an implanted port, you should seek immediate medical care if:  You notice a bad smell coming from the incision site.  You have swelling, redness, or drainage at the incision site.  You have more swelling or pain at the port site or the surrounding area.  You have a fever that is not controlled with medicine.  This information is not intended to replace advice given to you by your health care provider. Make sure you discuss any questions you have with your health care provider. Document  Released: 09/24/2005 Document Revised: 03/01/2016 Document Reviewed: 06/01/2013 Elsevier Interactive Patient Education  2017 Elsevier Inc.  

## 2017-10-14 NOTE — Assessment & Plan Note (Signed)
She is rate controlled and on anticoagulation therapy Continue medical management

## 2017-10-14 NOTE — Assessment & Plan Note (Signed)
She has acquired pancytopenia She does not need blood transfusion She will continue G-CSF support after each cycle of treatment

## 2017-10-14 NOTE — Assessment & Plan Note (Signed)
She is doing very well chemotherapy without major side effects CT scan showed positive response to treatment Tumor marker is improving Due to her other comorbidities, the plan is to proceed for total 6 cycles of neoadjuvant chemotherapy before interval debulking surgery The patient did not like the side effects of G-CSF She would like to reduce it to 1 day injection, to be given on day 3 which I think is reasonable, given the fact that she has never developed infectious complications

## 2017-10-15 LAB — CA 125: Cancer Antigen (CA) 125: 307.4 U/mL — ABNORMAL HIGH (ref 0.0–38.1)

## 2017-10-16 ENCOUNTER — Inpatient Hospital Stay: Payer: Medicare Other

## 2017-10-16 DIAGNOSIS — Z5111 Encounter for antineoplastic chemotherapy: Secondary | ICD-10-CM | POA: Diagnosis not present

## 2017-10-16 DIAGNOSIS — C561 Malignant neoplasm of right ovary: Secondary | ICD-10-CM

## 2017-10-16 LAB — URINE CULTURE

## 2017-10-16 MED ORDER — TBO-FILGRASTIM 300 MCG/0.5ML ~~LOC~~ SOSY
300.0000 ug | PREFILLED_SYRINGE | Freq: Once | SUBCUTANEOUS | Status: AC
  Administered 2017-10-16: 300 ug via SUBCUTANEOUS

## 2017-10-16 NOTE — Patient Instructions (Signed)
Tbo-Filgrastim injection What is this medicine? TBO-FILGRASTIM (T B O fil GRA stim) is a granulocyte colony-stimulating factor that stimulates the growth of neutrophils, a type of white blood cell important in the body's fight against infection. It is used to reduce the incidence of fever and infection in patients with certain types of cancer who are receiving chemotherapy that affects the bone marrow. This medicine may be used for other purposes; ask your health care provider or pharmacist if you have questions. COMMON BRAND NAME(S): Granix What should I tell my health care provider before I take this medicine? They need to know if you have any of these conditions: -bone scan or tests planned -kidney disease -sickle cell anemia -an unusual or allergic reaction to tbo-filgrastim, filgrastim, pegfilgrastim, other medicines, foods, dyes, or preservatives -pregnant or trying to get pregnant -breast-feeding How should I use this medicine? This medicine is for injection under the skin. If you get this medicine at home, you will be taught how to prepare and give this medicine. Refer to the Instructions for Use that come with your medication packaging. Use exactly as directed. Take your medicine at regular intervals. Do not take your medicine more often than directed. It is important that you put your used needles and syringes in a special sharps container. Do not put them in a trash can. If you do not have a sharps container, call your pharmacist or healthcare provider to get one. Talk to your pediatrician regarding the use of this medicine in children. Special care may be needed. Overdosage: If you think you have taken too much of this medicine contact a poison control center or emergency room at once. NOTE: This medicine is only for you. Do not share this medicine with others. What if I miss a dose? It is important not to miss your dose. Call your doctor or health care professional if you miss a  dose. What may interact with this medicine? This medicine may interact with the following medications: -medicines that may cause a release of neutrophils, such as lithium This list may not describe all possible interactions. Give your health care provider a list of all the medicines, herbs, non-prescription drugs, or dietary supplements you use. Also tell them if you smoke, drink alcohol, or use illegal drugs. Some items may interact with your medicine. What should I watch for while using this medicine? You may need blood work done while you are taking this medicine. What side effects may I notice from receiving this medicine? Side effects that you should report to your doctor or health care professional as soon as possible: -allergic reactions like skin rash, itching or hives, swelling of the face, lips, or tongue -blood in the urine -dark urine -dizziness -fast heartbeat -feeling faint -shortness of breath or breathing problems -signs and symptoms of infection like fever or chills; cough; or sore throat -signs and symptoms of kidney injury like trouble passing urine or change in the amount of urine -stomach or side pain, or pain at the shoulder -sweating -swelling of the legs, ankles, or abdomen -tiredness Side effects that usually do not require medical attention (report to your doctor or health care professional if they continue or are bothersome): -bone pain -headache -muscle pain -vomiting This list may not describe all possible side effects. Call your doctor for medical advice about side effects. You may report side effects to FDA at 1-800-FDA-1088. Where should I keep my medicine? Keep out of the reach of children. Store in a refrigerator between   2 and 8 degrees C (36 and 46 degrees F). Keep in carton to protect from light. Throw away this medicine if it is left out of the refrigerator for more than 5 consecutive days. Throw away any unused medicine after the expiration  date. NOTE: This sheet is a summary. It may not cover all possible information. If you have questions about this medicine, talk to your doctor, pharmacist, or health care provider.  2018 [employer]vier/Gold Standard (2015-11-14 19:07:04)  

## 2017-10-17 ENCOUNTER — Telehealth: Payer: Self-pay

## 2017-10-17 ENCOUNTER — Other Ambulatory Visit: Payer: Self-pay

## 2017-10-17 MED ORDER — AMOXICILLIN 500 MG PO TABS: 500.0000 mg | ORAL_TABLET | Freq: Two times a day (BID) | ORAL | 0 refills | Status: AC

## 2017-10-17 MED ORDER — MAGIC MOUTHWASH W/LIDOCAINE: 5.0000 mL | Freq: Four times a day (QID) | ORAL | 0 refills | Status: DC

## 2017-10-17 NOTE — Telephone Encounter (Signed)
Called daughter and told urine culture came back positive. Rx sent to pharmacy.  Daughter said her Mom is complaining of mouth sore soreness. In the past she has taken magic mouth wash.

## 2017-10-17 NOTE — Telephone Encounter (Signed)
Called daughter back. She left earlier message that her Mom was not eating. Magic mouth wash called into pharmacy. Instructed to take Mom to ER to be evaluated if she continue to not eat. Verbalized understanding.

## 2017-10-18 ENCOUNTER — Other Ambulatory Visit: Payer: Self-pay | Admitting: Hematology and Oncology

## 2017-10-18 DIAGNOSIS — C561 Malignant neoplasm of right ovary: Secondary | ICD-10-CM

## 2017-10-18 DIAGNOSIS — M6281 Muscle weakness (generalized): Secondary | ICD-10-CM

## 2017-10-21 ENCOUNTER — Telehealth: Payer: Self-pay

## 2017-10-21 NOTE — Telephone Encounter (Signed)
Nurse called and left message to call her back.  Called back she needed urine culture results for report on patient.   Daughter is requesting that injections be given at [place] to save them from driving to [place].

## 2017-10-22 NOTE — Telephone Encounter (Signed)
Called daughter and told her that [facility] does not have a provider at this time. Verbalized understanding.  Daughter wanted Dr. Alvy Bimler to know that her Mom needs the injection broken up in to 2 days, instead of one day. She was more uncomfortable with the one day injection.

## 2017-10-23 NOTE — Telephone Encounter (Signed)
Her mom requested it only for one day because she says 2 days still hurt so last cycle we tried only 1 day and see if that was adequate

## 2017-10-24 ENCOUNTER — Telehealth: Payer: Self-pay | Admitting: Physician Assistant

## 2017-10-24 NOTE — Telephone Encounter (Signed)
[name]( Daughter) is wanting to know if its ok to wait until 12/03/17 for [name] to be seen in the [place] office w/ Dr. Domenic Polite . Its better for her to go to the [place] office. Please call .   Thanks

## 2017-10-24 NOTE — Telephone Encounter (Signed)
Pt's daughter [name] would like for pt to be seen in [place] with her cardiologist  Dr. Domenic Polite instead of coming here to see scott weaver PA. According to daughter pt was seen in this office, because at the time pt was in a assisted living in [place]. Now pt lives in [place] and is easier for her to go to [facility] cardiology office to be see there by Dr. Domenic Polite. Pt's appointment with Nicki Reaper weaver was canceled. Pt has already an appointment with Dr.  Domenic Polite on 12/03/17 at 11:20 AM. Daughter states that pt is doing well now.

## 2017-10-28 ENCOUNTER — Telehealth (HOSPITAL_COMMUNITY): Payer: Self-pay | Admitting: Specialist

## 2017-10-28 ENCOUNTER — Ambulatory Visit: Payer: Self-pay | Admitting: Physician Assistant

## 2017-10-28 ENCOUNTER — Telehealth (HOSPITAL_COMMUNITY): Payer: Self-pay | Admitting: Internal Medicine

## 2017-10-28 NOTE — Telephone Encounter (Signed)
Pt's daughter called back and confirmed that 10:30 on Wed would be great. NF 10/28/17

## 2017-10-28 NOTE — Telephone Encounter (Signed)
10/28/17  I called daughter [name] to see if I could move patient from 9:45 to 10:30 with Beth so patient wouldn't have to wait for 75mnutes for next therapy

## 2017-10-29 ENCOUNTER — Ambulatory Visit: Payer: Self-pay | Admitting: Physician Assistant

## 2017-10-30 ENCOUNTER — Other Ambulatory Visit: Payer: Self-pay

## 2017-10-30 ENCOUNTER — Ambulatory Visit (HOSPITAL_COMMUNITY): Payer: Medicare Other | Admitting: Physical Therapy

## 2017-10-30 ENCOUNTER — Ambulatory Visit (HOSPITAL_COMMUNITY): Payer: Medicare Other | Attending: Hematology and Oncology | Admitting: Specialist

## 2017-10-30 ENCOUNTER — Encounter (HOSPITAL_COMMUNITY): Payer: Self-pay | Admitting: Physical Therapy

## 2017-10-30 ENCOUNTER — Telehealth: Payer: Self-pay | Admitting: *Deleted

## 2017-10-30 ENCOUNTER — Encounter (HOSPITAL_COMMUNITY): Payer: Self-pay | Admitting: Specialist

## 2017-10-30 DIAGNOSIS — R2689 Other abnormalities of gait and mobility: Secondary | ICD-10-CM | POA: Insufficient documentation

## 2017-10-30 DIAGNOSIS — M6281 Muscle weakness (generalized): Secondary | ICD-10-CM

## 2017-10-30 DIAGNOSIS — M79662 Pain in left lower leg: Secondary | ICD-10-CM

## 2017-10-30 NOTE — Patient Instructions (Signed)
Occupational Therapy Exercises  1. Both arms straight out in front of you, keep your elbows straight do not let them bend, the criss/cross your arms in front of you. 2. Both arms straight out in front of you, keep elbows straight.  Bend your wrist back and make small circles in the air.  Go in one direction the go in the other (Like you are waxing something) 3. Both arms straight out in front of you, keep elbows straight.  Bend your wrists back and move your arm up while the left arm goes down as quick as you can. 4. With your right/left arm straight out in front of you, keep your elbow straight.  Write your name in the air 3-4 times quickly. 5. Put both of your hands on your lap with your palms down, turn your palms up and down as fast as you can. 6. Put both of your hands on your lap keeping your wrists on your lap at all times.  No pat on your lap quickly alternating right/left. 7. Put your hands on your lap and drum your fingers.  Make sure your fingers are moving individually. 8. Play with a deck of cards. (Shuffle, deal cards, turn cards over pick up off table one by one)   Do the above exercises at least 2 times a day, completing each exercise 10 times.

## 2017-10-30 NOTE — Patient Instructions (Signed)
Bridging    Slowly raise buttocks from floor, keeping stomach tight. Repeat _5___ times per set. Do _1___ sets per session. Do _2___ sessions per day.  http://orth.exer.us/1096   Copyright  VHI. All rights reserved.

## 2017-10-30 NOTE — Therapy (Signed)
[place], [place], [address] Phone: [phone]   Fax:  [phone]  Occupational Therapy Evaluation  Patient Details  Name: [name] MRN: [id number] Date of Birth: 08/24/1933 Referring Provider: Dr. Heath Lark   Encounter Date: 10/30/2017  OT End of Session - 10/30/17 1612    Visit Number  1    Number of Visits  8    Date for OT Re-Evaluation  11/29/17    Authorization Type  Medicare Part B    OT Start Time  1030    OT Stop Time  1105    OT Time Calculation (min)  35 min    Activity Tolerance  Patient tolerated treatment well    Behavior During Therapy  [facility] for tasks assessed/performed       Past Medical History:  Diagnosis Date  . COPD (chronic obstructive pulmonary disease) ([place])   . Depression   . Diverticulosis   . Family hx of colon cancer   . Fibromyalgia   . GERD (gastroesophageal reflux disease)   . Hemorrhoids   . Hiatal hernia   . History of shingles   . Hypothyroidism   . IBS (irritable bowel syndrome)   . Lymphocytic colitis   . Osteoporosis   . Ovarian cancer ([place])   . Paroxysmal atrial fibrillation ([facility])   . Psoriatic arthritis ([place])   . Rectal polyp   . Schatzki's ring   . Uterine polyp     Past Surgical History:  Procedure Laterality Date  . CHOLECYSTECTOMY  Dec 04, 2010  . COLONOSCOPY    . FEMUR IM NAIL Left 12/11/2013   Procedure: INTRAMEDULLARY (IM) NAIL FEMORAL;  Surgeon: [name], MD;  Location: [facility];  Service: Orthopedics;  Laterality: Left;  . FRACTURE SURGERY Left 12/11/2013   hip  . IR FLUORO GUIDE PORT INSERTION RIGHT  07/22/2017  . IR PARACENTESIS  07/12/2017  . IR US GUIDE VASC ACCESS RIGHT  07/22/2017  . KNEE SURGERY Left    x 2  . TOTAL KNEE REVISION  08/06/2012   Procedure: TOTAL KNEE REVISION;  Surgeon: [name], MD;  Location: [facility];  Service: Orthopedics;  Laterality: Left;  Left Total Knee Arthroplasty Revision  . Uterine  polypectomy      There were no vitals filed for this visit.  Subjective Assessment - 10/30/17 1602    Subjective   S:  I was getting therapy at [facility] and I was making progress, now I am weak again    Patient is accompained by:  Family member    Pertinent History  [name] has been diagnosed with COPD and is in active treatment for Ovarian Cancer.  She was hospitalized in October and then had short term rehab at [facility] from 10/17-12/21, and recieved OT and PT daily.  Once home she did recieve HH OT and PT, and has felt a decline in her daily function.  She does have an aide that assists her with BADLS approximately 25 hours per week.  [name] would like to improve her independence level so that she can complete her B/IADLs independently. She has been referred to occupational therapy for evaluation and treatment.      Patient Stated Goals  I want to get stronger so that I can have my surgery in March.     Pain Score  2     Pain Location  Other (Comment) overall body pain    Pain Descriptors /  Indicators  Aching    Aggravating Factors   nothing    Pain Relieving Factors  activity    Effect of Pain on Daily Activities  decreased ability to complete ADLs.        [facility] OT Assessment - 10/30/17 1631      Assessment   Medical Diagnosis  Generalized Weakness    Referring Provider  Dr. Heath Lark    Onset Date/Surgical Date  10/18/17    Hand Dominance  Right    Prior Therapy  PT and OT at SNF and [facility]       Precautions   Precautions  Other (comment) active cancer      Restrictions   Weight Bearing Restrictions  No      Balance Screen   Has the patient fallen in the past 6 months  No    Has the patient had a decrease in activity level because of a fear of falling?   No    Is the patient reluctant to leave their home because of a fear of falling?   No      Home  Environment   Family/patient expects to be discharged to:  Private residence    Lives With  Alone has an aide  and family assistance       Prior Function   Level of [place]  Retired    Leisure  cooking      ADL   ADL comments  patient becomes fatigued and takes increased time to complete bathing and dressing activities.  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       Coordination   Gross Motor Movements are Fluid and Coordinated  Yes    Fine Motor Movements are Fluid and Coordinated  Yes      ROM / Strength   AROM / PROM / Strength  AROM;Strength      AROM   Overall AROM Comments  BUE AROM is [facility]      Strength   Overall Strength Comments  BUE strength is 4/5      Hand Function   Right Hand Grip (lbs)  45    Right Hand Lateral Pinch  12 lbs    Right Hand 3 Point Pinch  12 lbs    Left Hand Grip (lbs)  45    Left Hand Lateral Pinch  12 lbs    Left 3 point pinch  12 lbs                      OT Education - 10/30/17 1611    Education provided  Yes    Education Details  patient educated on general upper body exercises     Person(s) Educated  Patient;Caregiver(s);Child(ren)    Methods  Explanation;Demonstration    Comprehension  Verbalized understanding       OT Short Term Goals - 10/30/17  [place] #1   Title  Patient will be educated on a HEP for improved upper body sustained activity tolerance.     Time  4    Period  Weeks    Status  New    Target Date  11/29/17      OT SHORT TERM GOAL #2   Title  Patient will be educated on and  independently apply energy conservation prinicples when completing B/IADLs.     Time  4    Period  Weeks    Status  New      OT SHORT TERM GOAL #3   Title  Patient will independently prepare a meal of cornbread and pintos utilizing energy conservation techniques.     Time  4    Period  Weeks    Status  New      OT SHORT TERM GOAL #4   Title  Patient will improve upper body sustained activity tolerance from fair to good - for increased independence and safety with ADL completion.     Time  4    Period  Weeks    Status  New      OT SHORT TERM GOAL #5   Title  Patient will be able to complete B/ADLs and IADLS at previous level of independence safely and independently.     Time  4    Period  Weeks    Status  New               Plan - 10/30/17 1612    Clinical Impression Statement  A:  Patient is an 84 year old female with past medical history significant for COPD and active ovarian cancer, fibromyalgia, IBS.  Patient has been referred to occupational therapy for evaluation and treatment for general upper body weakness.  Prior to hospitalization, she was independent with B/IADLs and driving.  Currently, she requires assistance with dressing, bathing, and all IADLs.      Occupational Profile and client history currently impacting functional performance  motivation    Occupational performance deficits (Please refer to evaluation for details):  IADL's;Leisure;Social Participation    Rehab Potential  Good    Current Impairments/barriers affecting progress:  medical diagnosis    OT Frequency  2x / week    OT Duration  4 weeks    OT Treatment/Interventions  Self-care/ADL training;Therapeutic exercise;Energy conservation;DME and/or AE instruction;Patient/family education;Therapeutic activities    Plan  P:  Skilled OT intervention to increase upper body sustained activity tolerance in order to return to prior level of function with all desired B/IADLs and improve stamina so that she may  have surgery.  next session: review plan of care, educate patient on energy conservation principles that can be applied to B/IADLs in clinic simulation and at home.      Clinical Decision Making  Limited treatment options, no task modification necessary    OT Home Exercise Plan  1/23:  general upper body     Consulted and Agree with Plan of Care  Patient;Family member/caregiver    Family Member Consulted  daughter       Patient will benefit from skilled therapeutic intervention in order to improve the following deficits and impairments:  Pain, Cardiopulmonary status limiting activity, Decreased activity tolerance, Impaired perceived functional ability  Visit Diagnosis: Muscle weakness (generalized) - Plan: Ot plan of care cert/re-cert    Problem List Patient Active Problem List   Diagnosis Date Noted  .  Pleural effusion on right 09/20/2017  . Acute on chronic diastolic heart failure ([place]) 08/19/2017  . Pancytopenia, acquired ([place]) 08/15/2017  . Drug-induced hypokalemia 08/15/2017  . Protein-calorie malnutrition, moderate ([place]) 07/30/2017  . Generalized weakness 07/30/2017  . Antineoplastic chemotherapy induced pancytopenia ([place]) 07/20/2017  . Ovarian CA, right ([place]) 07/18/2017  . PNA (pneumonia) 07/07/2017  . Ascites 06/30/2017  . Ascites, malignant 06/30/2017  . Right ovarian cyst 01/18/2017  . Elevated CA-125 01/18/2017  . Depression 12/23/2015  . Peripheral edema 08/23/2015  . Fracture of hip, left, closed ([place]) 12/08/2013  . Hip fracture ([place]) 12/08/2013  . PAD (peripheral artery disease) ([place]) 11/25/2013  . Encounter for therapeutic drug monitoring 11/09/2013  . Atrial fibrillation ([place]) 05/07/2013  . Cough 03/20/2013  . Total knee replacement status 10/07/2012  . Knee pain 10/07/2012  . Knee stiffness 10/07/2012  . Tachycardia 09/17/2012  . Chest pain 09/17/2012  . Difficulty in walking(719.7) 09/16/2012  . Muscle weakness (generalized) 09/16/2012  . Postop Acute  blood loss anemia 08/08/2012  . Instability of prosthetic knee ([place]) 08/06/2012  . Bloating 02/14/2012  . Upper abdominal pain 02/14/2012  . Allergic rhinitis, seasonal 02/15/2011  . COLITIS 02/20/2010  . Diarrhea 01/02/2010  . ABDOMINAL PAIN -GENERALIZED 01/02/2010  . PERSONAL HX COLONIC POLYPS 01/02/2010  . HYPOTHYROIDISM 12/28/2009  . COPD (chronic obstructive pulmonary disease) ([place]) 12/28/2009  . ARTHRITIS 12/28/2009    Vangie Bicker, [place], OTR/L [phone]  10/30/2017, 4:35 PM  [facility] [address] [place], [place], [address] Phone: [phone]   Fax:  [phone]  Name: [name] MRN: [id number] Date of Birth: 1933-01-06

## 2017-10-30 NOTE — Therapy (Addendum)
[place], [place], [address] Phone: [phone]   Fax:  [phone]  Physical Therapy Evaluation  Patient Details  Name: [name] MRN: [id number] Date of Birth: [birth year] Referring Provider: Heath Lark   Encounter Date: 10/30/2017  PT End of Session - 10/30/17 1221    Visit Number  1    Number of Visits  18    Date for PT Re-Evaluation  11/30/2017   Authorization Type  Medicare    PT Start Time  1115    PT Stop Time  1201    PT Time Calculation (min)  46 min    Activity Tolerance  Patient limited by fatigue       Past Medical History:  Diagnosis Date  . COPD (chronic obstructive pulmonary disease) ([place])   . Depression   . Diverticulosis   . Family hx of colon cancer   . Fibromyalgia   . GERD (gastroesophageal reflux disease)   . Hemorrhoids   . Hiatal hernia   . History of shingles   . Hypothyroidism   . IBS (irritable bowel syndrome)   . Lymphocytic colitis   . Osteoporosis   . Ovarian cancer ([place])   . Paroxysmal atrial fibrillation (H[facility])   . Psoriatic arthritis ([place])   . Rectal polyp   . Schatzki's ring   . Uterine polyp     Past Surgical History:  Procedure Laterality Date  . CHOLECYSTECTOMY  Dec 04, 2010  . COLONOSCOPY    . FEMUR IM NAIL Left 12/11/2013   Procedure: INTRAMEDULLARY (IM) NAIL FEMORAL;  Surgeon: [name], MD;  Location: [facility];  Service: Orthopedics;  Laterality: Left;  . FRACTURE SURGERY Left 12/11/2013   hip  . IR FLUORO GUIDE PORT INSERTION RIGHT  07/22/2017  . IR PARACENTESIS  07/12/2017  . IR US GUIDE VASC ACCESS RIGHT  07/22/2017  . KNEE SURGERY Left    x 2  . TOTAL KNEE REVISION  08/06/2012   Procedure: TOTAL KNEE REVISION;  Surgeon: [name], MD;  Location: [facility];  Service: Orthopedics;  Laterality: Left;  Left Total Knee Arthroplasty Revision  . Uterine polypectomy      There were no vitals filed for this visit.   Subjective  Assessment - 10/30/17 1118    Subjective  Ovarian cancer; overall feeling weak. [facility] in [place] but discharged; [facility]PT  until last week but didn't feel like she was getting what she needed.    Patient is accompained by:  Family member daughter and aide    How long can you sit comfortably?  not limited    How long can you stand comfortably?   typically doesn't stand has gotten so weak    How long can you walk comfortably?  about 25 feet limited by breathing sometimes    Patient Stated Goals  Would like to come to clinic to build up her strength and endurance    Currently in Pain?  Yes    Pain Score  2  with medications    Pain Location  -- knees wake her at night; hurt all over    Pain Descriptors / Indicators  Throbbing    Aggravating Factors   nothing that can think of     Pain Relieving Factors  medications; walking about helps some too    Effect of Pain on Daily Activities  restricts daily activity         [facility] PT Assessment -  10/30/17 0001      Assessment   Medical Diagnosis  generalized weakness    Referring Provider  Alvy Bimler, Ni    Onset Date/Surgical Date  10/18/17    Hand Dominance  -- Unknown    Next MD Visit  unknown    Prior Therapy  HHPT      Precautions   Precautions  None      Restrictions   Weight Bearing Restrictions  No      Balance Screen   Has the patient fallen in the past 6 months  No    Has the patient had a decrease in activity level because of a fear of falling?   Yes    Is the patient reluctant to leave their home because of a fear of falling?   Yes needs someone with her      [place]  Other (Comment) with aide M-F 8:30-1:30; daughter provides dinner; dtr wkend    Type of [place]  Other (comment)    Home Layout  One level    [place] - 2 wheels;Cane - single point;Bedside commode;Shower seat;Grab bars - tub/shower       Prior Function   Level of Independence  Independent with household mobility with device    Vocation  Retired      Consulting civil engineer   Right/Left Hip  Right;Left    Right Hip Flexion  4-/5    Right Hip Extension  3/5 in side-lying    Right Hip ABduction  4-/5    Left Hip Flexion  4-/5    Left Hip Extension  -- in side-lying    Left Hip ABduction  3+/5    Right/Left Knee  Right;Left    Right Knee Flexion  4+/5    Right Knee Extension  3/5 in side-lying    Left Knee Flexion  4-/5    Left Knee Extension  3/5 in side-lying    Right/Left Ankle  Right;Left    Right Ankle Dorsiflexion  4/5    Left Ankle Dorsiflexion  4-/5      Ambulation/Gait   Ambulation/Gait  Yes    Assistive device  Rolling walker    Gait Pattern  Antalgic LLE    Ambulation Surface  Level    Gait Comments  3 MWT 144 ft      Balance   Balance Assessed  Yes    Balance comment  SLS - Rt - 5 sec; Lt 3 sec      Functional Gait  Assessment   Gait assessed   --             Objective measurements completed on examination: See above findings.              PT Education - 10/30/17 1220    Education provided  Yes    Education Details  supine bridging and walking laps within home environment    Person(s) Educated  Patient;Caregiver(s);Child(ren)    Methods  Explanation    Comprehension  Verbalized understanding       PT Short Term Goals - 10/30/17 1231      PT SHORT TERM GOAL #1   Title  Patient will ambulated >250 feet with rolling walker during the 3 MWT to improve her ability to ambulate further within her home before needing to rest.    Baseline  144 ft  Time  3    Period  Weeks    Status  New    Target Date  11/20/17      PT SHORT TERM GOAL #2   Title  Patient with tolerate the standing position with upper extremity support for 5 minutes before needing to rest to improve her ability to perform standing functional activities and ADLs within her home environment.    Time  3    Period  Weeks     Status  New      PT SHORT TERM GOAL #3   Title  Patient will increase MMT grades by 1/2 grade to show improvement in her strength and stability to perform functional activities.    Time  3    Period  Weeks    Status  New      PT SHORT TERM GOAL #4   Title  Patient will be able to stand on either foot for 10 seconds to exhibit improved balance and decreased risk for falls.    Time  3    Period  Weeks    Status  New        PT Long Term Goals - 10/30/17 1236      PT LONG TERM GOAL #1   Title  Patient will ambulated >500 feet with rolling walker during the 3 MWT to improve her ability to ambulate further  in community to allow to her go enjoy going out for a meal.    Time  6    Period  Weeks    Status  New    Target Date  12/14/17      PT LONG TERM GOAL #2   Title  Patient with tolerate the standing position with upper extremity support for 10 minutes before needing to rest to improve her ability to stand and have a conversation with a friend or relative.    Time  6    Period  Weeks    Status  New      PT LONG TERM GOAL #3   Title  Patient will increase MMT grades by 1 grade to show continued improvement in her strength and stability to perform functional activities at home independently.    Time  6    Period  Weeks    Status  New      PT LONG TERM GOAL #4   Title  Patient will be able to stand on either foot for 15 seconds to exhibit improved balance and further decreased risk for falls.    Time  6    Period  Weeks    Status  New             Plan - 10/30/17 1224    Clinical Impression Statement  Pt is a 84 year old female with ovarian cancer who exhibits generalized weakness, decreased strength, decreased activity tolerance, increased pain in LLE, decreased endurance, decreased balance, and increased risk for falls. Patient would benefit from skilled physical therapy to improved the afore mentioned deficits. Patient fatigue appears to be limited by muscle diminished  endurance as her O2 sats were at 97% post 3 Minute Walk Test..    Clinical Presentation  Stable    Clinical Decision Making  Low    Rehab Potential  Good    PT Frequency  3x / week    PT Duration  6 weeks    PT Treatment/Interventions  ADLs/Self Care Home Management;Gait training;Stair training;Functional mobility training;Therapeutic activities;Patient/family education;Balance training;Neuromuscular re-education;Therapeutic  exercise;Manual techniques;Energy conservation    PT Next Visit Plan  Pt limited by endurance in supine as well as weight bearing activities; allow rest time; mixed standing and supine strengthening exercises for hips/gluts    PT Home Exercise Plan  intial - lap walking at home; supine bridges    Consulted and Agree with Plan of Care  Patient;Family member/caregiver;Other (Comment) aide       Patient will benefit from skilled therapeutic intervention in order to improve the following deficits and impairments:  Abnormal gait, Pain, Decreased mobility, Decreased coordination, Decreased activity tolerance, Decreased endurance, Decreased strength, Decreased balance, Difficulty walking  Visit Diagnosis: Muscle weakness (generalized) - Plan: PT plan of care cert/re-cert  Pain in left lower leg - Plan: PT plan of care cert/re-cert  Other abnormalities of gait and mobility     Problem List Patient Active Problem List   Diagnosis Date Noted  . Pleural effusion on right 09/20/2017  . Acute on chronic diastolic heart failure ([place]) 08/19/2017  . Pancytopenia, acquired ([place]) 08/15/2017  . Drug-induced hypokalemia 08/15/2017  . Protein-calorie malnutrition, moderate ([place]) 07/30/2017  . Generalized weakness 07/30/2017  . Antineoplastic chemotherapy induced pancytopenia ([place]) 07/20/2017  . Ovarian CA, right ([place]) 07/18/2017  . PNA (pneumonia) 07/07/2017  . Ascites 06/30/2017  . Ascites, malignant 06/30/2017  . Right ovarian cyst 01/18/2017  . Elevated CA-125 01/18/2017   . Depression 12/23/2015  . Peripheral edema 08/23/2015  . Fracture of hip, left, closed ([place]) 12/08/2013  . Hip fracture ([place]) 12/08/2013  . PAD (peripheral artery disease) ([place]) 11/25/2013  . Encounter for therapeutic drug monitoring 11/09/2013  . Atrial fibrillation ([place]) 05/07/2013  . Cough 03/20/2013  . Total knee replacement status 10/07/2012  . Knee pain 10/07/2012  . Knee stiffness 10/07/2012  . Tachycardia 09/17/2012  . Chest pain 09/17/2012  . Difficulty in walking(719.7) 09/16/2012  . Muscle weakness (generalized) 09/16/2012  . Postop Acute blood loss anemia 08/08/2012  . Instability of prosthetic knee ([place]) 08/06/2012  . Bloating 02/14/2012  . Upper abdominal pain 02/14/2012  . Allergic rhinitis, seasonal 02/15/2011  . COLITIS 02/20/2010  . Diarrhea 01/02/2010  . ABDOMINAL PAIN -GENERALIZED 01/02/2010  . PERSONAL HX COLONIC POLYPS 01/02/2010  . HYPOTHYROIDISM 12/28/2009  . COPD (chronic obstructive pulmonary disease) ([place]) 12/28/2009  . ARTHRITIS 12/28/2009    Rayetta Humphrey, PT CLT [phone] 10/30/2017, 12:54 PM  [facility] [address] [place], [place], [address] Phone: [phone]   Fax:  [phone]  Name: [name] MRN: [id number] Date of Birth: [birth year]

## 2017-10-30 NOTE — Telephone Encounter (Signed)
Should be OK to proceed

## 2017-10-30 NOTE — Telephone Encounter (Signed)
Daughter left a message wanting to know if Mrs Andy should continue prolia injections

## 2017-10-30 NOTE — Telephone Encounter (Signed)
Notified of message below

## 2017-10-31 ENCOUNTER — Encounter (HOSPITAL_COMMUNITY): Payer: Self-pay

## 2017-10-31 ENCOUNTER — Other Ambulatory Visit: Payer: Self-pay

## 2017-10-31 ENCOUNTER — Ambulatory Visit (HOSPITAL_COMMUNITY): Payer: Medicare Other

## 2017-10-31 DIAGNOSIS — M6281 Muscle weakness (generalized): Secondary | ICD-10-CM | POA: Diagnosis not present

## 2017-10-31 DIAGNOSIS — R2689 Other abnormalities of gait and mobility: Secondary | ICD-10-CM

## 2017-10-31 DIAGNOSIS — M79662 Pain in left lower leg: Secondary | ICD-10-CM

## 2017-10-31 NOTE — Therapy (Signed)
[place], [place], [address] Phone: [phone]   Fax:  [phone]  Physical Therapy Treatment  Patient Details  Name: [name] MRN: [id number] Date of Birth: 1932/11/19 Referring Provider: Dr. Heath Lark   Encounter Date: 10/31/2017  PT End of Session - 10/31/17 1412    Visit Number  2    Number of Visits  18    Date for PT Re-Evaluation  11/05/17    Authorization Type  Medicare    PT Start Time  1350    PT Stop Time  1429    PT Time Calculation (min)  39 min    Activity Tolerance  Patient limited by fatigue;Patient tolerated treatment well    Behavior During Therapy  [facility] for tasks assessed/performed       Past Medical History:  Diagnosis Date  . COPD (chronic obstructive pulmonary disease) ([place])   . Depression   . Diverticulosis   . Family hx of colon cancer   . Fibromyalgia   . GERD (gastroesophageal reflux disease)   . Hemorrhoids   . Hiatal hernia   . History of shingles   . Hypothyroidism   . IBS (irritable bowel syndrome)   . Lymphocytic colitis   . Osteoporosis   . Ovarian cancer ([place])   . Paroxysmal atrial fibrillation ([facility])   . Psoriatic arthritis ([place])   . Rectal polyp   . Schatzki's ring   . Uterine polyp     Past Surgical History:  Procedure Laterality Date  . CHOLECYSTECTOMY  Dec 04, 2010  . COLONOSCOPY    . FEMUR IM NAIL Left 12/11/2013   Procedure: INTRAMEDULLARY (IM) NAIL FEMORAL;  Surgeon: [name], MD;  [facility];  Service: Orthopedics;  Laterality: Left;  . FRACTURE SURGERY Left 12/11/2013   hip  . IR FLUORO GUIDE PORT INSERTION RIGHT  07/22/2017  . IR PARACENTESIS  07/12/2017  . IR US GUIDE VASC ACCESS RIGHT  07/22/2017  . KNEE SURGERY Left    x 2  . TOTAL KNEE REVISION  08/06/2012   Procedure: TOTAL KNEE REVISION;  Surgeon: [name], MD;  [facility];  Service: Orthopedics;  Laterality: Left;  Left Total Knee Arthroplasty Revision  .  Uterine polypectomy      There were no vitals filed for this visit.  Subjective Assessment - 10/31/17 1416    Subjective  Patient is a little tired but feels ok overall. She just came from the salon where she had a pedicure done to clean up her feet. She states seh just wants to get stronger so that she doesn't feel as tired and can walk farther.    How long can you sit comfortably?  not limited    How long can you stand comfortably?   typically doesn't stand has gotten so weak    How long can you walk comfortably?  about 25 feet limited by breathing sometimes    Patient Stated Goals  Would like to come to clinic to build up her strength and endurance    Currently in Pain?  No/denies       [facility] Adult PT Treatment/Exercise - 10/31/17 0001      Ambulation/Gait   Ambulation/Gait  Yes    Assistive device  Rolling walker    Gait Comments  2 bouts 60 feet, slow gait with reported SOB at end      Therapeutic Activites    Therapeutic Activities  Other Therapeutic  Activities    Other Therapeutic Activities  Pursed lip breathing , 1x 10 reps and then throughout remainder of session to prevent/decrease SOB      Exercises   Exercises  Knee/Hip      Knee/Hip Exercises: Seated   Sit to Sand  2 sets;5 reps;without UE support cues for forward trunk lean and breathing      Knee/Hip Exercises: Supine   Bridges  Both;2 sets;10 reps;Limitations    Bridges Limitations  tactile cue to prevent hip IR      Knee/Hip Exercises: Sidelying   Clams  2x 10 reps BLE         PT Education - 10/31/17 1411    Education provided  Yes    Education Details  Reviewed eval and goals with patient. Initiated and educated on exercises throughout session. Educated on purseed lip breathing to perform during exercises.     Person(s) Educated  Patient    Methods  Explanation;Demonstration    Comprehension  Verbalized understanding;Returned demonstration       PT Short Term Goals - 10/30/17 1231      PT SHORT  TERM GOAL #1   Title  Patient will ambulated >250 feet with rolling walker during the 3 MWT to improve her ability to ambulate further within her home before needing to rest.    Baseline  144 ft    Time  3    Period  Weeks    Status  New    Target Date  11/20/17      PT SHORT TERM GOAL #2   Title  Patient with tolerate the standing position with upper extremity support for 5 minutes before needing to rest to improve her ability to perform standing functional activities and ADLs within her home environment.    Time  3    Period  Weeks    Status  New      PT SHORT TERM GOAL #3   Title  Patient will increase MMT grades by 1/2 grade to show improvement in her strength and stability to perform functional activities.    Time  3    Period  Weeks    Status  New      PT SHORT TERM GOAL #4   Title  Patient will be able to stand on either foot for 10 seconds to exhibit improved balance and decreased risk for falls.    Time  3    Period  Weeks    Status  New        PT Long Term Goals - 10/30/17 1236      PT LONG TERM GOAL #1   Title  Patient will ambulated >500 feet with rolling walker during the 3 MWT to improve her ability to ambulate further  in community to allow to her go enjoy going out for a meal.    Time  6    Period  Weeks    Status  New    Target Date  12/14/17      PT LONG TERM GOAL #2   Title  Patient with tolerate the standing position with upper extremity support for 10 minutes before needing to rest to improve her ability to stand and have a conversation with a friend or relative.    Time  6    Period  Weeks    Status  New      PT LONG TERM GOAL #3   Title  Patient will increase MMT grades by 1  grade to show continued improvement in her strength and stability to perform functional activities at home independently.    Time  6    Period  Weeks    Status  New      PT LONG TERM GOAL #4   Title  Patient will be able to stand on either foot for 15 seconds to exhibit  improved balance and further decreased risk for falls.    Time  6    Period  Weeks    Status  New         Plan - 10/31/17 1421    Clinical Impression Statement  Patient initiated first treatment session for PT today. She performed functional sit to stands for strengthening with cues to facilitate forward trunk lean and performed proximal hip strenghtening. She continues to be limited by faituge throughout session and requires frequent/extended rest breaks.  She was educated on pursed lip breathing to decrease SOB throughout exercises. She will continue to benefit from skilled PT services to address current impairments and progress activity tolerance.     Rehab Potential  Good    PT Frequency  3x / week    PT Duration  6 weeks    PT Treatment/Interventions  ADLs/Self Care Home Management;Gait training;Stair training;Functional mobility training;Therapeutic activities;Patient/family education;Balance training;Neuromuscular re-education;Therapeutic exercise;Manual techniques;Energy conservation    PT Next Visit Plan  Pt limited by endurance in supine as well as weight bearing activities; allow rest time; mixed standing and supine strengthening exercises for hips/gluts. NuStep for BLE strengthening and cardiovascular endurance.    PT Home Exercise Plan  intial - lap walking at home; supine bridges    Consulted and Agree with Plan of Care  Patient;Family member/caregiver;Other (Comment) aide       Patient will benefit from skilled therapeutic intervention in order to improve the following deficits and impairments:  Abnormal gait, Pain, Decreased mobility, Decreased coordination, Decreased activity tolerance, Decreased endurance, Decreased strength, Decreased balance, Difficulty walking  Visit Diagnosis: Muscle weakness (generalized)  Pain in left lower leg  Other abnormalities of gait and mobility     Problem List Patient Active Problem List   Diagnosis Date Noted  . Pleural effusion  on right 09/20/2017  . Acute on chronic diastolic heart failure ([place]) 08/19/2017  . Pancytopenia, acquired ([place]) 08/15/2017  . Drug-induced hypokalemia 08/15/2017  . Protein-calorie malnutrition, moderate ([place]) 07/30/2017  . Generalized weakness 07/30/2017  . Antineoplastic chemotherapy induced pancytopenia ([place]) 07/20/2017  . Ovarian CA, right ([place]) 07/18/2017  . PNA (pneumonia) 07/07/2017  . Ascites 06/30/2017  . Ascites, malignant 06/30/2017  . Right ovarian cyst 01/18/2017  . Elevated CA-125 01/18/2017  . Depression 12/23/2015  . Peripheral edema 08/23/2015  . Fracture of hip, left, closed ([place]) 12/08/2013  . Hip fracture ([place]) 12/08/2013  . PAD (peripheral artery disease) ([place]) 11/25/2013  . Encounter for therapeutic drug monitoring 11/09/2013  . Atrial fibrillation ([place]) 05/07/2013  . Cough 03/20/2013  . Total knee replacement status 10/07/2012  . Knee pain 10/07/2012  . Knee stiffness 10/07/2012  . Tachycardia 09/17/2012  . Chest pain 09/17/2012  . Difficulty in walking(719.7) 09/16/2012  . Muscle weakness (generalized) 09/16/2012  . Postop Acute blood loss anemia 08/08/2012  . Instability of prosthetic knee ([place]) 08/06/2012  . Bloating 02/14/2012  . Upper abdominal pain 02/14/2012  . Allergic rhinitis, seasonal 02/15/2011  . COLITIS 02/20/2010  . Diarrhea 01/02/2010  . ABDOMINAL PAIN -GENERALIZED 01/02/2010  . PERSONAL HX COLONIC POLYPS 01/02/2010  . HYPOTHYROIDISM 12/28/2009  . COPD (  chronic obstructive pulmonary disease) ([place]) 12/28/2009  . ARTHRITIS 12/28/2009    Kipp Brood, PT, DPT Physical Therapist with [facility]  10/31/2017 2:35 PM    [facility] [address] [place], [place], [address] Phone: [phone]   Fax:  [phone]  Name: [name] MRN: [id number] Date of Birth: 21-Feb-1933

## 2017-11-04 ENCOUNTER — Inpatient Hospital Stay (HOSPITAL_BASED_OUTPATIENT_CLINIC_OR_DEPARTMENT_OTHER): Payer: Medicare Other | Admitting: Hematology and Oncology

## 2017-11-04 ENCOUNTER — Inpatient Hospital Stay: Payer: Medicare Other

## 2017-11-04 ENCOUNTER — Encounter: Payer: Self-pay | Admitting: Hematology and Oncology

## 2017-11-04 VITALS — BP 133/65 | HR 95 | Temp 97.8°F | Resp 18 | Ht 64.0 in | Wt 163.6 lb

## 2017-11-04 DIAGNOSIS — Z7689 Persons encountering health services in other specified circumstances: Secondary | ICD-10-CM | POA: Diagnosis not present

## 2017-11-04 DIAGNOSIS — J90 Pleural effusion, not elsewhere classified: Secondary | ICD-10-CM | POA: Diagnosis not present

## 2017-11-04 DIAGNOSIS — Z79899 Other long term (current) drug therapy: Secondary | ICD-10-CM

## 2017-11-04 DIAGNOSIS — C561 Malignant neoplasm of right ovary: Secondary | ICD-10-CM

## 2017-11-04 DIAGNOSIS — F329 Major depressive disorder, single episode, unspecified: Secondary | ICD-10-CM

## 2017-11-04 DIAGNOSIS — R599 Enlarged lymph nodes, unspecified: Secondary | ICD-10-CM

## 2017-11-04 DIAGNOSIS — R531 Weakness: Secondary | ICD-10-CM

## 2017-11-04 DIAGNOSIS — D61818 Other pancytopenia: Secondary | ICD-10-CM

## 2017-11-04 DIAGNOSIS — Z5111 Encounter for antineoplastic chemotherapy: Secondary | ICD-10-CM | POA: Diagnosis not present

## 2017-11-04 DIAGNOSIS — C786 Secondary malignant neoplasm of retroperitoneum and peritoneum: Secondary | ICD-10-CM

## 2017-11-04 DIAGNOSIS — I4891 Unspecified atrial fibrillation: Secondary | ICD-10-CM

## 2017-11-04 DIAGNOSIS — R0602 Shortness of breath: Secondary | ICD-10-CM

## 2017-11-04 DIAGNOSIS — R630 Anorexia: Secondary | ICD-10-CM

## 2017-11-04 DIAGNOSIS — Z7901 Long term (current) use of anticoagulants: Secondary | ICD-10-CM

## 2017-11-04 DIAGNOSIS — R634 Abnormal weight loss: Secondary | ICD-10-CM

## 2017-11-04 DIAGNOSIS — R197 Diarrhea, unspecified: Secondary | ICD-10-CM

## 2017-11-04 DIAGNOSIS — J449 Chronic obstructive pulmonary disease, unspecified: Secondary | ICD-10-CM

## 2017-11-04 DIAGNOSIS — G893 Neoplasm related pain (acute) (chronic): Secondary | ICD-10-CM

## 2017-11-04 DIAGNOSIS — J438 Other emphysema: Secondary | ICD-10-CM

## 2017-11-04 LAB — CBC WITH DIFFERENTIAL/PLATELET
BASOS ABS: 0 10*3/uL (ref 0.0–0.1)
BASOS PCT: 0 %
EOS PCT: 0 %
Eosinophils Absolute: 0 10*3/uL (ref 0.0–0.5)
HCT: 32.3 % — ABNORMAL LOW (ref 34.8–46.6)
Hemoglobin: 11 g/dL — ABNORMAL LOW (ref 11.6–15.9)
Lymphocytes Relative: 8 %
Lymphs Abs: 0.4 10*3/uL — ABNORMAL LOW (ref 0.9–3.3)
MCH: 32.3 pg (ref 25.1–34.0)
MCHC: 34 g/dL (ref 31.5–36.0)
MCV: 95 fL (ref 79.5–101.0)
MONO ABS: 0.1 10*3/uL (ref 0.1–0.9)
Monocytes Relative: 1 %
NEUTROS ABS: 4.7 10*3/uL (ref 1.5–6.5)
Neutrophils Relative %: 91 %
PLATELETS: 101 10*3/uL — AB (ref 145–400)
RBC: 3.4 MIL/uL — ABNORMAL LOW (ref 3.70–5.45)
RDW: 15.6 % (ref 11.2–16.1)
WBC: 5.2 10*3/uL (ref 3.9–10.3)

## 2017-11-04 LAB — COMPREHENSIVE METABOLIC PANEL
ALBUMIN: 3.3 g/dL — AB (ref 3.5–5.0)
ALK PHOS: 61 U/L (ref 40–150)
ALT: 9 U/L (ref 0–55)
ANION GAP: 9 (ref 3–11)
AST: 11 U/L (ref 5–34)
BILIRUBIN TOTAL: 0.3 mg/dL (ref 0.2–1.2)
BUN: 11 mg/dL (ref 7–26)
CALCIUM: 9.2 mg/dL (ref 8.4–10.4)
CO2: 27 mmol/L (ref 22–29)
Chloride: 100 mmol/L (ref 98–109)
Creatinine, Ser: 0.83 mg/dL (ref 0.60–1.10)
GFR calc Af Amer: >60 mL/min (ref >60)
GLUCOSE: 227 mg/dL — AB (ref 70–140)
Potassium: 4.1 mmol/L (ref 3.3–4.7)
Sodium: 136 mmol/L (ref 136–145)
TOTAL PROTEIN: 7 g/dL (ref 6.4–8.3)

## 2017-11-04 MED ORDER — SODIUM CHLORIDE 0.9% FLUSH
10.0000 mL | Freq: Once | INTRAVENOUS | Status: AC
  Administered 2017-11-04: 10 mL
  Filled 2017-11-04: qty 10

## 2017-11-04 MED ORDER — DIPHENHYDRAMINE HCL 50 MG/ML IJ SOLN
50.0000 mg | Freq: Once | INTRAMUSCULAR | Status: AC
  Administered 2017-11-04: 50 mg via INTRAVENOUS

## 2017-11-04 MED ORDER — SODIUM CHLORIDE 0.9 % IV SOLN
Freq: Once | INTRAVENOUS | Status: AC
  Administered 2017-11-04: 12:00:00 via INTRAVENOUS
  Filled 2017-11-04: qty 5

## 2017-11-04 MED ORDER — SODIUM CHLORIDE 0.9 % IV SOLN
440.0000 mg | Freq: Once | INTRAVENOUS | Status: AC
  Administered 2017-11-04: 440 mg via INTRAVENOUS
  Filled 2017-11-04: qty 44

## 2017-11-04 MED ORDER — SODIUM CHLORIDE 0.9 % IV SOLN
175.0000 mg/m2 | Freq: Once | INTRAVENOUS | Status: AC
  Administered 2017-11-04: 336 mg via INTRAVENOUS
  Filled 2017-11-04: qty 56

## 2017-11-04 MED ORDER — HEPARIN SOD (PORK) LOCK FLUSH 100 UNIT/ML IV SOLN
500.0000 [IU] | Freq: Once | INTRAVENOUS | Status: AC | PRN
  Administered 2017-11-04: 500 [IU]
  Filled 2017-11-04: qty 5

## 2017-11-04 MED ORDER — FUROSEMIDE 20 MG PO TABS: 20.0000 mg | ORAL_TABLET | Freq: Every day | ORAL | 9 refills | Status: DC

## 2017-11-04 MED ORDER — SODIUM CHLORIDE 0.9 % IV SOLN
Freq: Once | INTRAVENOUS | Status: AC
  Administered 2017-11-04: 12:00:00 via INTRAVENOUS

## 2017-11-04 MED ORDER — OXYCODONE HCL 10 MG PO TABS: 10.0000 mg | ORAL_TABLET | ORAL | 0 refills | Status: DC | PRN

## 2017-11-04 MED ORDER — SODIUM CHLORIDE 0.9% FLUSH
10.0000 mL | INTRAVENOUS | Status: DC | PRN
  Administered 2017-11-04: 10 mL
  Filled 2017-11-04: qty 10

## 2017-11-04 MED ORDER — PALONOSETRON HCL INJECTION 0.25 MG/5ML
0.2500 mg | Freq: Once | INTRAVENOUS | Status: AC
  Administered 2017-11-04: 0.25 mg via INTRAVENOUS

## 2017-11-04 MED ORDER — FAMOTIDINE IN NACL 20-0.9 MG/50ML-% IV SOLN
20.0000 mg | Freq: Once | INTRAVENOUS | Status: AC
  Administered 2017-11-04: 20 mg via INTRAVENOUS

## 2017-11-04 MED ORDER — MIRTAZAPINE 15 MG PO TABS: 15.0000 mg | ORAL_TABLET | Freq: Every day | ORAL | 1 refills | Status: DC

## 2017-11-04 NOTE — Assessment & Plan Note (Addendum)
She has mild worsening shortness of breath since discharge from skilled nursing facility She will continue inhalers as directed and oxygen as needed There is a component of fluid overload.  I refilled her prescription of Lasix She also have mild persistent pleural effusion Plan to repeat CT scan of the chest next month and consider thoracentesis if needed I also recommend low-dose steroid for a few days after treatment

## 2017-11-04 NOTE — Assessment & Plan Note (Signed)
We had a long discussion about plan of care She is very frail We will complete cycle 6 of treatment today Plan to repeat CT imaging in the month before her next GYN oncology appointment She will continue PT and rehab in the meantime

## 2017-11-04 NOTE — Assessment & Plan Note (Signed)
She is frail She has lost a lot of weight We will adjust her chemotherapy dose accordingly She does not need blood transfusion

## 2017-11-04 NOTE — Assessment & Plan Note (Signed)
She will continue physical therapy and occupational therapy as outpatient

## 2017-11-04 NOTE — Assessment & Plan Note (Signed)
Has depression but denies suicidal ideation I will start her on Remeron

## 2017-11-04 NOTE — Progress Notes (Signed)
[facility] PROGRESS NOTE  Patient Care Team: Asencion Noble, MD as PCP - General (Internal Medicine)  SUMMARY OF ONCOLOGIC HISTORY:   Ovarian CA, right ([place])   02/18/2016 Tumor Marker    Patient's tumor was tested for the following markers: CA125 Results of the tumor marker test revealed 45      05/22/2016 Tumor Marker    Patient's tumor was tested for the following markers: CA125 Results of the tumor marker test revealed 53      05/22/2016 Imaging    Outside pelvic US showed 4.1 cm adnexa mass      06/24/2017 Imaging    Ct abdomen and pelvis:  1. Interim finding of moderate ascites within the abdomen and pelvis with additional finding of diffuse nodular infiltration of the omentum and anterior mesenteric fat, the appearance would be consistent with peritoneal carcinomatosis/metastatic disease. Increasing retroperitoneal and upper abdominal adenopathy. 2. Re- demonstrated 3.8 cm cyst in the right adnexa. Enlarging soft tissue density in the left adnexa now with possible cystic component posteriorly. In light of the above findings, concern is for ovarian neoplasm. Correlation with pelvic ultrasound recommended. 3. Small right-sided pleural effusion, new since prior study 4. Stable hypodense splenic lesions since 2017.       06/25/2017 Imaging    US pelvis: 2.9 cm simple appearing cyst in the right ovary. Left ovary grossly unremarkable. Large volume ascites in the pelvis      06/30/2017 - 07/01/2017 Hospital Admission    She was admitted for evaluation of abdominal pain and ascites      07/01/2017 Pathology Results    PERITONEAL/ASCITIC FLUID(SPECIMEN 1 OF 1 COLLECTED 07/01/17): - POORLY DIFFERENTIATED CARCINOMA; SEE COMMENT Source Peritoneal/Ascitic Fluid, (specimen 1 of 1 collected 07/01/17) Gross Specimen: Received is/are 1000 cc's of brownish fluid. (BS:bs) Prepared: # Smears: 0 # Concentration Technique Slides (i.e. ThinPrep): 1 # Cell Block:  1 Additional Studies: Also received Hematology slide - [id number]. Comment The tumor cells are positive for cytokeratin 7 and Pax-8 but negative for cytokeratin 20, CDX-2, GATA-3, Napsin-A and TTF-1. Based on the immunoprofile a gynecology primary is favored      07/01/2017 Procedure    Successful ultrasound-guided diagnostic and therapeutic paracentesis yielding 2.5 liters of peritoneal fluid      07/07/2017 - 07/09/2017 Hospital Admission    She was admitted for management of malignant ascites      07/08/2017 Procedure    Successful ultrasound-guided therapeutic paracentesis yielding 2.7 liters liters of peritoneal fluid      07/12/2017 Procedure    Successful ultrasound-guided paracentesis yielding 1450 mL of peritoneal fluid      07/18/2017 - 07/24/2017 Hospital Admission    She was admitted for expedited treatment      07/18/2017 Tumor Marker    Patient's tumor was tested for the following markers: CA125 Results of the tumor marker test revealed 1941      07/19/2017 -  Chemotherapy    The patient had palonosetron (ALOXI) injection 0.25 mg, 0.25 mg, Intravenous,  Once, 1 of 6 cycles  pegfilgrastim (NEULASTA) injection 6 mg, 6 mg, Subcutaneous,  Once, 1 of 6 cycles  CARBOplatin (PARAPLATIN) 490 mg in sodium chloride 0.9 % 250 mL chemo infusion, 490 mg (100 % of original dose 487.2 mg), Intravenous,  Once, 1 of 6 cycles Dose modification:   (original dose 487.2 mg, Cycle 1)  PACLitaxel (TAXOL) 336 mg in dextrose 5 % 500 mL chemo infusion (> 9m/m2), 175 mg/m2 = 336 mg, Intravenous,  Once, 1 of 6 cycles  fosaprepitant (EMEND) 150 mg, dexamethasone (DECADRON) 12 mg in sodium chloride 0.9 % 145 mL IVPB, , Intravenous,  Once, 1 of 6 cycles  for chemotherapy treatment.        08/06/2017 Procedure    Successful ultrasound-guided therapeutic paracentesis yielding 2.6 liters of peritoneal fluid.      08/09/2017 Tumor Marker    Patient's tumor was tested for the following markers:  CA125 Results of the tumor marker test revealed 1665      08/15/2017 Tumor Marker    Patient's tumor was tested for the following markers: CA125 Results of the tumor marker test revealed 937.9      08/20/2017 Imaging    ECHO: Normal LV size with EF 60-65%. Normal RV size and systolic function. No significant valvular abnormalities.      09/18/2017 Imaging    Chest Impression:  1. No evidence thoracic metastasis. 2. Interval increase and RIGHT pleural effusion.  Abdomen / Pelvis Impression:  1. Interval decrease in intraperitoneal free fluid. 2. Interval decrease in omental nodularity in the LEFT ventral peritoneal space. 3. Interval decrease in nodularity associated with the LEFT ovary. 4. Cystic portion of the RIGHT ovary is increased mildly in size.      09/20/2017 Tumor Marker    Patient's tumor was tested for the following markers: CA125 Results of the tumor marker test revealed 347      10/14/2017 Tumor Marker    Patient's tumor was tested for the following markers: CA125 Results of the tumor marker test revealed 307.4       INTERVAL HISTORY: Please see below for problem oriented charting. She returns for cycle #6 of chemotherapy She admits to feeling depressed She denies suicidal ideation She continues to have weakness but denies falls She started having physical therapy 3 times a week and occupational therapy twice a week She has severe bone pain after recent G-CSF injection She continues to have shortness of breath on minimal exertion She requires oxygen therapy continuously She continues taking Lasix daily She had chronic diarrhea but with recent constipation She is regulating her bowel habits with laxatives  REVIEW OF SYSTEMS:   Constitutional: Denies fevers, chills or abnormal weight loss Eyes: Denies blurriness of vision Ears, nose, mouth, throat, and face: Denies mucositis or sore throat Cardiovascular: Denies palpitation, chest discomfort or lower  extremity swelling Skin: Denies abnormal skin rashes Lymphatics: Denies new lymphadenopathy or easy bruising Behavioral/Psych: Mood is stable, no new changes  All other systems were reviewed with the patient and are negative.  I have reviewed the past medical history, past surgical history, social history and family history with the patient and they are unchanged from previous note.  ALLERGIES:  is allergic to codeine.  MEDICATIONS:  Current Outpatient Medications  Medication Sig Dispense Refill  . albuterol (PROAIR HFA) 108 (90 Base) MCG/ACT inhaler Inhale 2 puffs into the lungs every 6 (six) hours as needed for wheezing or shortness of breath. 1 Inhaler 3  . ALPRAZolam (XANAX) 0.25 MG tablet Take 1 tablet (0.25 mg total) by mouth at bedtime as needed for anxiety. 60 tablet 0  . azelastine (ASTELIN) 0.1 % nasal spray Place 2 sprays into both nostrils 2 (two) times daily. Use in each nostril as directed    . budesonide-formoterol (SYMBICORT) 160-4.5 MCG/ACT inhaler Inhale 2 puffs into the lungs 2 (two) times daily. 1 Inhaler 3  . Cholecalciferol (VITAMIN D) 2000 units CAPS Take 2,000 Units by mouth daily.    Marland Kitchen  diltiazem (CARTIA XT) 120 MG 24 hr capsule TAKE 1 CAPSULE BY MOUTH EVERY DAY 90 capsule 3  . diphenoxylate-atropine (LOMOTIL) 2.5-0.025 MG tablet Take 1 tablet by mouth 4 (four) times daily. (Patient taking differently: Take 1 tablet by mouth 4 (four) times daily as needed for diarrhea or loose stools. ) 360 tablet 1  . fluticasone (FLONASE) 50 MCG/ACT nasal spray Place 2 sprays into both nostrils 2 (two) times daily. For nasal congestion    . furosemide (LASIX) 20 MG tablet Take 1 tablet (20 mg total) by mouth daily. 90 tablet 9  . levothyroxine (SYNTHROID, LEVOTHROID) 137 MCG tablet Take 137 mcg by mouth every morning. For thyroid therapy    . lidocaine-prilocaine (EMLA) cream Apply 1 application topically as needed. 30 g 6  . loperamide (IMODIUM) 2 MG capsule Take 2 capsules (4 mg  total) by mouth 3 (three) times daily. 30 capsule 0  . magic mouthwash w/lidocaine SOLN Take 5 mLs by mouth 4 (four) times daily. 5 ml QID swish and spit 240 mL 0  . methylcellulose (ARTIFICIAL TEARS) 1 % ophthalmic solution Place 1 drop into both eyes 2 (two) times daily as needed. Dry eyes    . mirtazapine (REMERON) 15 MG tablet Take 1 tablet (15 mg total) by mouth at bedtime. 30 tablet 1  . ondansetron (ZOFRAN) 8 MG tablet Take 1 tablet (8 mg total) by mouth every 8 (eight) hours as needed for nausea or vomiting. 60 tablet 0  . oxyCODONE 10 MG TABS Take 1 tablet (10 mg total) by mouth every 4 (four) hours as needed for severe pain. 60 tablet 0  . prochlorperazine (COMPAZINE) 10 MG tablet Take 10 mg by mouth every 6 (six) hours as needed for nausea.  1  . senna-docusate (SENOKOT-S) 8.6-50 MG tablet Take 1 tablet by mouth at bedtime. 30 tablet 0  . XARELTO 20 MG TABS tablet TAKE 1 TABLET BY MOUTH EVERY DAY WITH SUPPER 90 tablet 0   No current facility-administered medications for this visit.    Facility-Administered Medications Ordered in Other Visits  Medication Dose Route Frequency Provider Last Rate Last Dose  . CARBOplatin (PARAPLATIN) 440 mg in sodium chloride 0.9 % 250 mL chemo infusion  440 mg Intravenous Once Alvy Bimler, Chisom Aust, MD      . famotidine (PEPCID) IVPB 20 mg premix  20 mg Intravenous Once Alvy Bimler, Raizy Auzenne, MD      . fosaprepitant (EMEND) 150 mg, dexamethasone (DECADRON) 12 mg in sodium chloride 0.9 % 145 mL IVPB   Intravenous Once Alvy Bimler, Micky Overturf, MD      . heparin lock flush 100 unit/mL  500 Units Intracatheter Once PRN Alvy Bimler, Tammatha Cobb, MD      . PACLitaxel (TAXOL) 336 mg in sodium chloride 0.9 % 500 mL chemo infusion (> 18m/m2)  175 mg/m2 (Treatment Plan Recorded) Intravenous Once Macintyre Alexa, MD      . sodium chloride flush (NS) 0.9 % injection 10 mL  10 mL Intracatheter PRN GAlvy Bimler Cashawn Yanko, MD        PHYSICAL EXAMINATION: ECOG PERFORMANCE STATUS: 2 - Symptomatic, <50% confined to  bed  Vitals:   11/04/17 0935  BP: 133/65  Pulse: 95  Resp: 18  Temp: 97.8 F (36.6 C)  SpO2: 99%   Filed Weights   11/04/17 0935  Weight: 163 lb 9.6 oz (74.2 kg)    GENERAL:alert, no distress and comfortable.  She looks frail, with oxygen delivered via nasal cannula, sitting on the wheelchair SKIN: skin color, texture, turgor are  normal, no rashes or significant lesions EYES: normal, Conjunctiva are pink and non-injected, sclera clear OROPHARYNX:no exudate, no erythema and lips, buccal mucosa, and tongue normal  NECK: supple, thyroid normal size, non-tender, without nodularity LYMPH:  no palpable lymphadenopathy in the cervical, axillary or inguinal LUNGS: Reduced breath sound on the right lung base with increased breathing effort HEART: regular rate & rhythm and no murmurs and no lower extremity edema ABDOMEN:abdomen soft, non-tender and normal bowel sounds Musculoskeletal:no cyanosis of digits and no clubbing  NEURO: alert & oriented x 3 with fluent speech, no focal motor/sensory deficits  LABORATORY DATA:  I have reviewed the data as listed    Component Value Date/Time   NA 136 11/04/2017 0853   NA 135 (L) 09/24/2017 0945   K 4.1 11/04/2017 0853   K 3.7 09/24/2017 0945   CL 100 11/04/2017 0853   CO2 27 11/04/2017 0853   CO2 30 (H) 09/24/2017 0945   GLUCOSE 227 (H) 11/04/2017 0853   GLUCOSE 101 09/24/2017 0945   BUN 11 11/04/2017 0853   BUN 15.3 09/24/2017 0945   CREATININE 0.83 11/04/2017 0853   CREATININE 0.8 09/24/2017 0945   CALCIUM 9.2 11/04/2017 0853   CALCIUM 9.1 09/24/2017 0945   PROT 7.0 11/04/2017 0853   PROT 6.6 09/24/2017 0945   ALBUMIN 3.3 (L) 11/04/2017 0853   ALBUMIN 3.3 (L) 09/24/2017 0945   AST 11 11/04/2017 0853   AST 16 09/24/2017 0945   ALT 9 11/04/2017 0853   ALT 12 09/24/2017 0945   ALKPHOS 61 11/04/2017 0853   ALKPHOS 52 09/24/2017 0945   BILITOT 0.3 11/04/2017 0853   BILITOT 1.13 09/24/2017 0945   GFRNONAA >60 11/04/2017 0853    GFRAA >60 11/04/2017 0853    No results found for: SPEP, UPEP  Lab Results  Component Value Date   WBC 5.2 11/04/2017   NEUTROABS 4.7 11/04/2017   HGB 11.0 (L) 11/04/2017   HCT 32.3 (L) 11/04/2017   MCV 95.0 11/04/2017   PLT 101 (L) 11/04/2017      Chemistry      Component Value Date/Time   NA 136 11/04/2017 0853   NA 135 (L) 09/24/2017 0945   K 4.1 11/04/2017 0853   K 3.7 09/24/2017 0945   CL 100 11/04/2017 0853   CO2 27 11/04/2017 0853   CO2 30 (H) 09/24/2017 0945   BUN 11 11/04/2017 0853   BUN 15.3 09/24/2017 0945   CREATININE 0.83 11/04/2017 0853   CREATININE 0.8 09/24/2017 0945      Component Value Date/Time   CALCIUM 9.2 11/04/2017 0853   CALCIUM 9.1 09/24/2017 0945   ALKPHOS 61 11/04/2017 0853   ALKPHOS 52 09/24/2017 0945   AST 11 11/04/2017 0853   AST 16 09/24/2017 0945   ALT 9 11/04/2017 0853   ALT 12 09/24/2017 0945   BILITOT 0.3 11/04/2017 0853   BILITOT 1.13 09/24/2017 0945      ASSESSMENT & PLAN:  Ovarian CA, right ([facility]) We had a long discussion about plan of care She is very frail We will complete cycle 6 of treatment today Plan to repeat CT imaging in the month before her next GYN oncology appointment She will continue PT and rehab in the meantime  Pancytopenia, acquired ([place]) She is frail She has lost a lot of weight We will adjust her chemotherapy dose accordingly She does not need blood transfusion  COPD (chronic obstructive pulmonary disease) ([place]) She has mild worsening shortness of breath since discharge from skilled nursing facility  She will continue inhalers as directed and oxygen as needed There is a component of fluid overload.  I refilled her prescription of Lasix She also have mild persistent pleural effusion Plan to repeat CT scan of the chest next month and consider thoracentesis if needed I also recommend low-dose steroid for a few days after treatment  Depression Has depression but denies suicidal ideation I will start  her on Remeron  Generalized weakness She will continue physical therapy and occupational therapy as outpatient  Pleural effusion on right She has mild persistent pleural effusion Plan to repeat CT scan as above   Orders Placed This Encounter  Procedures  . CT ABDOMEN PELVIS W CONTRAST    Standing Status:   Future    Standing Expiration Date:   11/04/2018    Order Specific Question:   If indicated for the ordered procedure, I authorize the administration of contrast media per Radiology protocol    Answer:   Yes    Order Specific Question:   Preferred imaging location?    Answer:   [facility]    Order Specific Question:   Radiology Contrast Protocol - do NOT remove file path    Answer:   \\[email or web address]  . CT CHEST W CONTRAST    Standing Status:   Future    Standing Expiration Date:   11/04/2018    Order Specific Question:   If indicated for the ordered procedure, I authorize the administration of contrast media per Radiology protocol    Answer:   Yes    Order Specific Question:   Preferred imaging location?    Answer:   [facility]    Order Specific Question:   Radiology Contrast Protocol - do NOT remove file path    Answer:   \\[email or web address]   All questions were answered. The patient knows to call the clinic with any problems, questions or concerns. No barriers to learning was detected. I spent 30 minutes counseling the patient face to face. The total time spent in the appointment was 40 minutes and more than 50% was on counseling and review of test results     Heath Lark, MD 11/04/2017 11:39 AM

## 2017-11-04 NOTE — Assessment & Plan Note (Signed)
She has mild persistent pleural effusion Plan to repeat CT scan as above

## 2017-11-04 NOTE — Patient Instructions (Signed)
   [facility] Discharge Instructions for Patients Receiving Chemotherapy  Today you received the following chemotherapy agents Taxol and Carboplatin   To help prevent nausea and vomiting after your treatment, we encourage you to take your nausea medication as directed.    If you develop nausea and vomiting that is not controlled by your nausea medication, call the clinic.   BELOW ARE SYMPTOMS THAT SHOULD BE REPORTED IMMEDIATELY:  *FEVER GREATER THAN 100.5 F  *CHILLS WITH OR WITHOUT FEVER  NAUSEA AND VOMITING THAT IS NOT CONTROLLED WITH YOUR NAUSEA MEDICATION  *UNUSUAL SHORTNESS OF BREATH  *UNUSUAL BRUISING OR BLEEDING  TENDERNESS IN MOUTH AND THROAT WITH OR WITHOUT PRESENCE OF ULCERS  *URINARY PROBLEMS  *BOWEL PROBLEMS  UNUSUAL RASH Items with * indicate a potential emergency and should be followed up as soon as possible.  Feel free to call the clinic should you have any questions or concerns. The clinic phone number is [phone].  Please show the CHEMO ALERT CARD at check-in to the Emergency Department and triage nurse.   

## 2017-11-05 ENCOUNTER — Telehealth: Payer: Self-pay | Admitting: *Deleted

## 2017-11-05 ENCOUNTER — Ambulatory Visit (HOSPITAL_COMMUNITY): Payer: Medicare Other

## 2017-11-05 ENCOUNTER — Telehealth (HOSPITAL_COMMUNITY): Payer: Self-pay

## 2017-11-05 ENCOUNTER — Telehealth: Payer: Self-pay | Admitting: Hematology and Oncology

## 2017-11-05 DIAGNOSIS — M79662 Pain in left lower leg: Secondary | ICD-10-CM | POA: Diagnosis present

## 2017-11-05 DIAGNOSIS — R2689 Other abnormalities of gait and mobility: Secondary | ICD-10-CM

## 2017-11-05 DIAGNOSIS — M6281 Muscle weakness (generalized): Secondary | ICD-10-CM

## 2017-11-05 LAB — CA 125: Cancer Antigen (CA) 125: 262.5 U/mL — ABNORMAL HIGH (ref 0.0–38.1)

## 2017-11-05 NOTE — Therapy (Signed)
[place], [place], [address] Phone: [phone]   Fax:  [phone]  Physical Therapy Treatment  Patient Details  Name: [name] MRN: [id number] Date of Birth: [birth year] Referring Provider: Dr. Heath Lark   Encounter Date: 11/05/2017  PT End of Session - 11/05/17 1108    Visit Number  3    Number of Visits  18    Date for PT Re-Evaluation  11/05/17    Authorization Type  Medicare    PT Start Time  1040    PT Stop Time  1120    PT Time Calculation (min)  40 min    Equipment Utilized During Treatment  Oxygen    Activity Tolerance  Patient tolerated treatment well;Treatment limited secondary to medical complications (Comment) limited by breathlessness, VSS    Behavior During Therapy  [facility] for tasks assessed/performed       Past Medical History:  Diagnosis Date  . COPD (chronic obstructive pulmonary disease) ([place])   . Depression   . Diverticulosis   . Family hx of colon cancer   . Fibromyalgia   . GERD (gastroesophageal reflux disease)   . Hemorrhoids   . Hiatal hernia   . History of shingles   . Hypothyroidism   . IBS (irritable bowel syndrome)   . Lymphocytic colitis   . Osteoporosis   . Ovarian cancer ([place])   . Paroxysmal atrial fibrillation ([facility])   . Psoriatic arthritis ([place])   . Rectal polyp   . Schatzki's ring   . Uterine polyp     Past Surgical History:  Procedure Laterality Date  . CHOLECYSTECTOMY  Dec 04, 2010  . COLONOSCOPY    . FEMUR IM NAIL Left 12/11/2013   Procedure: INTRAMEDULLARY (IM) NAIL FEMORAL;  Surgeon: [name], MD;  Location: [facility];  Service: Orthopedics;  Laterality: Left;  . FRACTURE SURGERY Left 12/11/2013   hip  . IR FLUORO GUIDE PORT INSERTION RIGHT  07/22/2017  . IR PARACENTESIS  07/12/2017  . IR US GUIDE VASC ACCESS RIGHT  07/22/2017  . KNEE SURGERY Left    x 2  . TOTAL KNEE REVISION  08/06/2012   Procedure: TOTAL KNEE REVISION;  Surgeon: [name], MD;  Location: [facility];  Service: Orthopedics;  Laterality: Left;  Left Total Knee Arthroplasty Revision  . Uterine polypectomy      There were no vitals filed for this visit.  Subjective Assessment - 11/05/17 1044    Subjective  Pt reports she is doign ok today. She had chemo all day yesterday from 8-5. She reports continued bila thip pain which is her baseline, suspected related to psoriatic arthritis.     Currently in Pain?  Yes    Pain Score  4     Pain Location  -- posterio buttocks and into legs bilat     Pain Orientation  -- bilat        High Intensity Interval Training    Activity Flow Rate SpO2 in % HR in BPM Nustep SPM NuStep Resistance  Resting 2L/min 98 89 N/A N/A   s/p 5 min WU  2L/min 96 83 45 Level 1  HIIT #1 2L/min   76 Level 5 x30sec  HIIT #2 2L/min   85 Level 5 x30sec  HIIT #3 2L/min   79 Level 5 x30sec  s/p third interval 2L/min 93 103 35 Level 1  3 minute recovery s/p third interval 2L/min 97 99 42 Level  0  *5-minutes easy warm-up with vital monitored; High Intensity Intervals for 30 secs, pt cued to move at maximal speed and effort, with 90sec easy recovery. Vitals taken at end.       [facility] Adult PT Treatment/Exercise - 11/05/17 0001      Ambulation/Gait   Ambulation Distance (Feet)  30 Feet    Assistive device  Rolling walker    Gait velocity  0.67ms    Gait Comments  on 2LPM O2      Exercises   Exercises  Knee/Hip      Knee/Hip Exercises: Seated   Sit to Sand  2 sets;5 reps;without UE support cues for forward trunk lean and breathing      Knee/Hip Exercises: Supine   Bridges Limitations  -- *unable to get to for time             PT Education - 11/05/17 1108    Education provided  No       PT Short Term Goals - 10/30/17 1231      PT SHORT TERM GOAL #1   Title  Patient will ambulated >250 feet with rolling walker during the 3 MWT to improve her ability to ambulate further within her home before needing to rest.    Baseline   144 ft    Time  3    Period  Weeks    Status  New    Target Date  11/20/17      PT SHORT TERM GOAL #2   Title  Patient with tolerate the standing position with upper extremity support for 5 minutes before needing to rest to improve her ability to perform standing functional activities and ADLs within her home environment.    Time  3    Period  Weeks    Status  New      PT SHORT TERM GOAL #3   Title  Patient will increase MMT grades by 1/2 grade to show improvement in her strength and stability to perform functional activities.    Time  3    Period  Weeks    Status  New      PT SHORT TERM GOAL #4   Title  Patient will be able to stand on either foot for 10 seconds to exhibit improved balance and decreased risk for falls.    Time  3    Period  Weeks    Status  New        PT Long Term Goals - 10/30/17 1236      PT LONG TERM GOAL #1   Title  Patient will ambulated >500 feet with rolling walker during the 3 MWT to improve her ability to ambulate further  in community to allow to her go enjoy going out for a meal.    Time  6    Period  Weeks    Status  New    Target Date  12/14/17      PT LONG TERM GOAL #2   Title  Patient with tolerate the standing position with upper extremity support for 10 minutes before needing to rest to improve her ability to stand and have a conversation with a friend or relative.    Time  6    Period  Weeks    Status  New      PT LONG TERM GOAL #3   Title  Patient will increase MMT grades by 1 grade to show continued improvement in her strength and stability to  perform functional activities at home independently.    Time  6    Period  Weeks    Status  New      PT LONG TERM GOAL #4   Title  Patient will be able to stand on either foot for 15 seconds to exhibit improved balance and further decreased risk for falls.    Time  6    Period  Weeks    Status  New            Plan - 11/05/17 1110    Clinical Impression Statement  Integrated high  intensity interval training on NuStep this session, vitals monitored for patient safety. Good response overall, only some mild SOB durign third interval. VSS. Pt reports Nustep helped with hip pain as well. Pt asks for more HEP items as she has not had any yet and she is issue STS and bridging. pt making good progress toward goals overall thus far.     History and Personal Factors relevant to plan of care:  chonic baseline O2;     Rehab Potential  Good    PT Frequency  3x / week    PT Duration  6 weeks    PT Treatment/Interventions  ADLs/Self Care Home Management;Gait training;Stair training;Functional mobility training;Therapeutic activities;Patient/family education;Balance training;Neuromuscular re-education;Therapeutic exercise;Manual techniques;Energy conservation    PT Next Visit Plan  Pt limited tolerance to activity and moderate DOE; limit time in supine as well as weight bearing; allow rest time; mixed standing and supine strengthening exercises for hips/gluts. NuStep for BLE strengthening and cardiovascular endurance.    PT Home Exercise Plan  intial - lap walking at home; supine bridges    Consulted and Agree with Plan of Care  Patient       Patient will benefit from skilled therapeutic intervention in order to improve the following deficits and impairments:  Abnormal gait, Pain, Decreased mobility, Decreased coordination, Decreased activity tolerance, Decreased endurance, Decreased strength, Decreased balance, Difficulty walking  Visit Diagnosis: Muscle weakness (generalized)  Pain in left lower leg  Other abnormalities of gait and mobility     Problem List Patient Active Problem List   Diagnosis Date Noted  . Pleural effusion on right 09/20/2017  . Acute on chronic diastolic heart failure ([place]) 08/19/2017  . Pancytopenia, acquired ([place]) 08/15/2017  . Protein-calorie malnutrition, moderate ([place]) 07/30/2017  . Generalized weakness 07/30/2017  . Antineoplastic chemotherapy  induced pancytopenia ([place]) 07/20/2017  . Ovarian CA, right ([place]) 07/18/2017  . PNA (pneumonia) 07/07/2017  . Ascites 06/30/2017  . Ascites, malignant 06/30/2017  . Right ovarian cyst 01/18/2017  . Elevated CA-125 01/18/2017  . Depression 12/23/2015  . Peripheral edema 08/23/2015  . Fracture of hip, left, closed ([place]) 12/08/2013  . Hip fracture ([place]) 12/08/2013  . PAD (peripheral artery disease) ([place]) 11/25/2013  . Encounter for therapeutic drug monitoring 11/09/2013  . Atrial fibrillation ([place]) 05/07/2013  . Cough 03/20/2013  . Total knee replacement status 10/07/2012  . Knee pain 10/07/2012  . Knee stiffness 10/07/2012  . Tachycardia 09/17/2012  . Chest pain 09/17/2012  . Difficulty in walking(719.7) 09/16/2012  . Muscle weakness (generalized) 09/16/2012  . Postop Acute blood loss anemia 08/08/2012  . Instability of prosthetic knee ([place]) 08/06/2012  . Bloating 02/14/2012  . Upper abdominal pain 02/14/2012  . Allergic rhinitis, seasonal 02/15/2011  . COLITIS 02/20/2010  . Diarrhea 01/02/2010  . ABDOMINAL PAIN -GENERALIZED 01/02/2010  . PERSONAL HX COLONIC POLYPS 01/02/2010  . HYPOTHYROIDISM 12/28/2009  . COPD (chronic obstructive pulmonary  disease) ([place]) 12/28/2009  . ARTHRITIS 12/28/2009   11:19 AM, 11/05/17 Etta Grandchild, PT, DPT Physical Therapist at [facility] [phone] (office)      Etta Grandchild 11/05/2017, 11:18 AM  [place] [address] [place], [place], [address] Phone: [phone]   Fax:  [phone]  Name: [name] MRN: [id number] Date of Birth: 1932/12/25

## 2017-11-05 NOTE — Telephone Encounter (Signed)
Return for no new orders per 1/28 los.

## 2017-11-05 NOTE — Telephone Encounter (Signed)
Notified of message below

## 2017-11-05 NOTE — Telephone Encounter (Signed)
-----   Message from Heath Lark, MD sent at 11/05/2017  9:28 AM EST ----- Regarding: tumor marker Let her know CA-125 is better ----- Message ----- From: Interface, Lab In [place] Sent: 11/04/2017   9:28 AM To: Heath Lark, MD

## 2017-11-05 NOTE — Telephone Encounter (Signed)
Print new schedule-pt requested to change all 9am apptments in the future. NF 11/04/17

## 2017-11-06 ENCOUNTER — Ambulatory Visit: Payer: Self-pay | Admitting: Physician Assistant

## 2017-11-06 ENCOUNTER — Inpatient Hospital Stay: Payer: Medicare Other

## 2017-11-06 VITALS — BP 131/54 | HR 80 | Temp 98.0°F | Resp 18

## 2017-11-06 DIAGNOSIS — C561 Malignant neoplasm of right ovary: Secondary | ICD-10-CM

## 2017-11-06 MED ORDER — TBO-FILGRASTIM 300 MCG/0.5ML ~~LOC~~ SOSY
300.0000 ug | PREFILLED_SYRINGE | Freq: Once | SUBCUTANEOUS | Status: AC
  Administered 2017-11-06: 300 ug via SUBCUTANEOUS

## 2017-11-06 MED ORDER — TBO-FILGRASTIM 300 MCG/0.5ML ~~LOC~~ SOSY
PREFILLED_SYRINGE | SUBCUTANEOUS | Status: AC
  Filled 2017-11-06: qty 0.5

## 2017-11-08 ENCOUNTER — Inpatient Hospital Stay (HOSPITAL_COMMUNITY)
Admission: EM | Admit: 2017-11-08 | Discharge: 2017-11-15 | DRG: 194 | Disposition: A | Discharge status: Skilled Nursing Facility | Payer: Medicare Other | Attending: Internal Medicine | Admitting: Internal Medicine

## 2017-11-08 ENCOUNTER — Ambulatory Visit (HOSPITAL_COMMUNITY): Payer: Medicare Other | Admitting: Physical Therapy

## 2017-11-08 ENCOUNTER — Other Ambulatory Visit: Payer: Self-pay

## 2017-11-08 ENCOUNTER — Inpatient Hospital Stay (HOSPITAL_COMMUNITY): Payer: Medicare Other

## 2017-11-08 ENCOUNTER — Telehealth (HOSPITAL_COMMUNITY): Payer: Self-pay | Admitting: Internal Medicine

## 2017-11-08 ENCOUNTER — Encounter (HOSPITAL_COMMUNITY): Payer: Self-pay | Admitting: Specialist

## 2017-11-08 ENCOUNTER — Ambulatory Visit (HOSPITAL_COMMUNITY): Payer: Medicare Other | Admitting: Specialist

## 2017-11-08 ENCOUNTER — Emergency Department (HOSPITAL_COMMUNITY): Payer: Medicare Other

## 2017-11-08 ENCOUNTER — Encounter (HOSPITAL_COMMUNITY): Payer: Self-pay | Admitting: Emergency Medicine

## 2017-11-08 DIAGNOSIS — Z87891 Personal history of nicotine dependence: Secondary | ICD-10-CM

## 2017-11-08 DIAGNOSIS — Z7901 Long term (current) use of anticoagulants: Secondary | ICD-10-CM

## 2017-11-08 DIAGNOSIS — J154 Pneumonia due to other streptococci: Secondary | ICD-10-CM | POA: Diagnosis present

## 2017-11-08 DIAGNOSIS — Z885 Allergy status to narcotic agent status: Secondary | ICD-10-CM

## 2017-11-08 DIAGNOSIS — K580 Irritable bowel syndrome with diarrhea: Secondary | ICD-10-CM | POA: Diagnosis present

## 2017-11-08 DIAGNOSIS — E119 Type 2 diabetes mellitus without complications: Secondary | ICD-10-CM | POA: Diagnosis present

## 2017-11-08 DIAGNOSIS — J9811 Atelectasis: Secondary | ICD-10-CM | POA: Diagnosis present

## 2017-11-08 DIAGNOSIS — E876 Hypokalemia: Secondary | ICD-10-CM | POA: Diagnosis present

## 2017-11-08 DIAGNOSIS — F419 Anxiety disorder, unspecified: Secondary | ICD-10-CM | POA: Diagnosis present

## 2017-11-08 DIAGNOSIS — J440 Chronic obstructive pulmonary disease with acute lower respiratory infection: Secondary | ICD-10-CM | POA: Diagnosis present

## 2017-11-08 DIAGNOSIS — Z9221 Personal history of antineoplastic chemotherapy: Secondary | ICD-10-CM | POA: Diagnosis not present

## 2017-11-08 DIAGNOSIS — J90 Pleural effusion, not elsewhere classified: Secondary | ICD-10-CM

## 2017-11-08 DIAGNOSIS — Z79899 Other long term (current) drug therapy: Secondary | ICD-10-CM

## 2017-11-08 DIAGNOSIS — F329 Major depressive disorder, single episode, unspecified: Secondary | ICD-10-CM | POA: Diagnosis present

## 2017-11-08 DIAGNOSIS — Z7951 Long term (current) use of inhaled steroids: Secondary | ICD-10-CM

## 2017-11-08 DIAGNOSIS — I4891 Unspecified atrial fibrillation: Secondary | ICD-10-CM | POA: Diagnosis not present

## 2017-11-08 DIAGNOSIS — K5900 Constipation, unspecified: Secondary | ICD-10-CM | POA: Diagnosis not present

## 2017-11-08 DIAGNOSIS — C569 Malignant neoplasm of unspecified ovary: Secondary | ICD-10-CM | POA: Diagnosis present

## 2017-11-08 DIAGNOSIS — J181 Lobar pneumonia, unspecified organism: Secondary | ICD-10-CM | POA: Diagnosis not present

## 2017-11-08 DIAGNOSIS — D638 Anemia in other chronic diseases classified elsewhere: Secondary | ICD-10-CM | POA: Diagnosis present

## 2017-11-08 DIAGNOSIS — I481 Persistent atrial fibrillation: Secondary | ICD-10-CM | POA: Diagnosis present

## 2017-11-08 DIAGNOSIS — Y95 Nosocomial condition: Secondary | ICD-10-CM | POA: Diagnosis present

## 2017-11-08 DIAGNOSIS — J449 Chronic obstructive pulmonary disease, unspecified: Secondary | ICD-10-CM | POA: Diagnosis present

## 2017-11-08 DIAGNOSIS — R0600 Dyspnea, unspecified: Secondary | ICD-10-CM

## 2017-11-08 DIAGNOSIS — K219 Gastro-esophageal reflux disease without esophagitis: Secondary | ICD-10-CM | POA: Diagnosis present

## 2017-11-08 DIAGNOSIS — J918 Pleural effusion in other conditions classified elsewhere: Secondary | ICD-10-CM | POA: Diagnosis present

## 2017-11-08 DIAGNOSIS — I499 Cardiac arrhythmia, unspecified: Secondary | ICD-10-CM | POA: Diagnosis present

## 2017-11-08 DIAGNOSIS — L4050 Arthropathic psoriasis, unspecified: Secondary | ICD-10-CM | POA: Diagnosis present

## 2017-11-08 DIAGNOSIS — B9729 Other coronavirus as the cause of diseases classified elsewhere: Secondary | ICD-10-CM | POA: Diagnosis not present

## 2017-11-08 DIAGNOSIS — J441 Chronic obstructive pulmonary disease with (acute) exacerbation: Secondary | ICD-10-CM | POA: Diagnosis not present

## 2017-11-08 DIAGNOSIS — R531 Weakness: Secondary | ICD-10-CM | POA: Diagnosis not present

## 2017-11-08 DIAGNOSIS — C561 Malignant neoplasm of right ovary: Secondary | ICD-10-CM | POA: Diagnosis not present

## 2017-11-08 DIAGNOSIS — K5909 Other constipation: Secondary | ICD-10-CM

## 2017-11-08 DIAGNOSIS — I480 Paroxysmal atrial fibrillation: Secondary | ICD-10-CM | POA: Diagnosis present

## 2017-11-08 DIAGNOSIS — B59 Pneumocystosis: Secondary | ICD-10-CM | POA: Diagnosis not present

## 2017-11-08 DIAGNOSIS — D61818 Other pancytopenia: Secondary | ICD-10-CM | POA: Diagnosis present

## 2017-11-08 DIAGNOSIS — Z96652 Presence of left artificial knee joint: Secondary | ICD-10-CM | POA: Diagnosis present

## 2017-11-08 DIAGNOSIS — D709 Neutropenia, unspecified: Secondary | ICD-10-CM | POA: Diagnosis not present

## 2017-11-08 DIAGNOSIS — R5381 Other malaise: Secondary | ICD-10-CM | POA: Diagnosis not present

## 2017-11-08 DIAGNOSIS — E8770 Fluid overload, unspecified: Secondary | ICD-10-CM | POA: Diagnosis present

## 2017-11-08 DIAGNOSIS — J189 Pneumonia, unspecified organism: Secondary | ICD-10-CM | POA: Diagnosis present

## 2017-11-08 DIAGNOSIS — C786 Secondary malignant neoplasm of retroperitoneum and peritoneum: Secondary | ICD-10-CM | POA: Diagnosis not present

## 2017-11-08 DIAGNOSIS — R634 Abnormal weight loss: Secondary | ICD-10-CM | POA: Diagnosis not present

## 2017-11-08 DIAGNOSIS — D701 Agranulocytosis secondary to cancer chemotherapy: Secondary | ICD-10-CM | POA: Diagnosis not present

## 2017-11-08 DIAGNOSIS — E039 Hypothyroidism, unspecified: Secondary | ICD-10-CM | POA: Diagnosis present

## 2017-11-08 DIAGNOSIS — G893 Neoplasm related pain (acute) (chronic): Secondary | ICD-10-CM

## 2017-11-08 DIAGNOSIS — Z9889 Other specified postprocedural states: Secondary | ICD-10-CM | POA: Diagnosis not present

## 2017-11-08 DIAGNOSIS — J431 Panlobular emphysema: Secondary | ICD-10-CM | POA: Diagnosis not present

## 2017-11-08 DIAGNOSIS — Z79890 Hormone replacement therapy (postmenopausal): Secondary | ICD-10-CM

## 2017-11-08 DIAGNOSIS — I5032 Chronic diastolic (congestive) heart failure: Secondary | ICD-10-CM | POA: Diagnosis present

## 2017-11-08 DIAGNOSIS — R0602 Shortness of breath: Secondary | ICD-10-CM | POA: Diagnosis not present

## 2017-11-08 LAB — BASIC METABOLIC PANEL
Anion gap: 9 (ref 5–15)
Anion gap: 6 (ref 5–15)
BUN: 22 mg/dL — AB (ref 6–20)
BUN: 18 mg/dL (ref 6–20)
CHLORIDE: 97 mmol/L — AB (ref 101–111)
CHLORIDE: 104 mmol/L (ref 101–111)
CO2: 32 mmol/L (ref 22–32)
CO2: 28 mmol/L (ref 22–32)
CREATININE: 0.43 mg/dL — AB (ref 0.44–1.00)
Calcium: 8.8 mg/dL — ABNORMAL LOW (ref 8.9–10.3)
Calcium: 8.1 mg/dL — ABNORMAL LOW (ref 8.9–10.3)
Creatinine, Ser: 0.64 mg/dL (ref 0.44–1.00)
GFR calc Af Amer: >60 mL/min (ref >60)
GFR calc Af Amer: >60 mL/min (ref >60)
GFR calc non Af Amer: >60 mL/min (ref >60)
Glucose, Bld: 132 mg/dL — ABNORMAL HIGH (ref 65–99)
Glucose, Bld: 138 mg/dL — ABNORMAL HIGH (ref 65–99)
Potassium: 3 mmol/L — ABNORMAL LOW (ref 3.5–5.1)
Potassium: 3.9 mmol/L (ref 3.5–5.1)
SODIUM: 138 mmol/L (ref 135–145)
Sodium: 138 mmol/L (ref 135–145)

## 2017-11-08 LAB — CBC WITH DIFFERENTIAL/PLATELET
Basophils Absolute: 0 10*3/uL (ref 0.0–0.1)
Basophils Relative: 0 %
Eosinophils Absolute: 0 10*3/uL (ref 0.0–0.7)
Eosinophils Relative: 0 %
HCT: 32.7 % — ABNORMAL LOW (ref 36.0–46.0)
Hemoglobin: 11 g/dL — ABNORMAL LOW (ref 12.0–15.0)
Lymphocytes Relative: 5 %
Lymphs Abs: 1.2 10*3/uL (ref 0.7–4.0)
MCH: 32.5 pg (ref 26.0–34.0)
MCHC: 33.6 g/dL (ref 30.0–36.0)
MCV: 96.7 fL (ref 78.0–100.0)
Monocytes Absolute: 0 10*3/uL — ABNORMAL LOW (ref 0.1–1.0)
Monocytes Relative: 0 %
Neutro Abs: 20.1 10*3/uL — ABNORMAL HIGH (ref 1.7–7.7)
Neutrophils Relative %: 95 %
Platelets: 121 10*3/uL — ABNORMAL LOW (ref 150–400)
RBC: 3.38 MIL/uL — ABNORMAL LOW (ref 3.87–5.11)
RDW: 14.6 % (ref 11.5–15.5)
WBC: 21.4 10*3/uL — ABNORMAL HIGH (ref 4.0–10.5)

## 2017-11-08 LAB — I-STAT CG4 LACTIC ACID, ED
LACTIC ACID, VENOUS: 0.72 mmol/L (ref 0.5–1.9)
Lactic Acid, Venous: 1.94 mmol/L — ABNORMAL HIGH (ref 0.5–1.9)

## 2017-11-08 LAB — TSH: TSH: 0.323 u[IU]/mL — AB (ref 0.350–4.500)

## 2017-11-08 LAB — PROCALCITONIN: PROCALCITONIN: 0.31 ng/mL

## 2017-11-08 LAB — MAGNESIUM: Magnesium: 1.5 mg/dL — ABNORMAL LOW (ref 1.7–2.4)

## 2017-11-08 MED ORDER — IOPAMIDOL (ISOVUE-370) INJECTION 76%
INTRAVENOUS | Status: AC
  Administered 2017-11-08: 100 mL via INTRAVENOUS
  Filled 2017-11-08: qty 100

## 2017-11-08 MED ORDER — DILTIAZEM HCL 30 MG PO TABS
30.0000 mg | ORAL_TABLET | Freq: Three times a day (TID) | ORAL | Status: DC
  Administered 2017-11-08 – 2017-11-11 (×9): 30 mg via ORAL
  Filled 2017-11-08 (×12): qty 1

## 2017-11-08 MED ORDER — LEVALBUTEROL HCL 0.63 MG/3ML IN NEBU: 0.6300 mg | INHALATION_SOLUTION | Freq: Four times a day (QID) | RESPIRATORY_TRACT | Status: DC | PRN

## 2017-11-08 MED ORDER — ALBUTEROL SULFATE HFA 108 (90 BASE) MCG/ACT IN AERS: 2.0000 | INHALATION_SPRAY | Freq: Four times a day (QID) | RESPIRATORY_TRACT | Status: DC | PRN

## 2017-11-08 MED ORDER — DILTIAZEM LOAD VIA INFUSION
10.0000 mg | Freq: Once | INTRAVENOUS | Status: DC
  Filled 2017-11-08: qty 10

## 2017-11-08 MED ORDER — ALPRAZOLAM 0.25 MG PO TABS
0.2500 mg | ORAL_TABLET | Freq: Every evening | ORAL | Status: DC | PRN
  Administered 2017-11-10 – 2017-11-12 (×3): 0.25 mg via ORAL
  Filled 2017-11-08 (×3): qty 1

## 2017-11-08 MED ORDER — SODIUM CHLORIDE 0.9 % IV BOLUS (SEPSIS)
500.0000 mL | Freq: Once | INTRAVENOUS | Status: AC
Start: 2017-11-08 — End: 2017-11-08
  Administered 2017-11-08: 500 mL via INTRAVENOUS

## 2017-11-08 MED ORDER — MOMETASONE FURO-FORMOTEROL FUM 200-5 MCG/ACT IN AERO
2.0000 | INHALATION_SPRAY | Freq: Two times a day (BID) | RESPIRATORY_TRACT | Status: DC
  Administered 2017-11-08 – 2017-11-15 (×14): 2 via RESPIRATORY_TRACT
  Filled 2017-11-08: qty 8.8

## 2017-11-08 MED ORDER — MAGNESIUM SULFATE 2 GM/50ML IV SOLN
2.0000 g | Freq: Once | INTRAVENOUS | Status: AC
  Administered 2017-11-08: 2 g via INTRAVENOUS
  Filled 2017-11-08: qty 50

## 2017-11-08 MED ORDER — POTASSIUM CHLORIDE CRYS ER 20 MEQ PO TBCR
40.0000 meq | EXTENDED_RELEASE_TABLET | Freq: Once | ORAL | Status: AC
  Administered 2017-11-08: 40 meq via ORAL
  Filled 2017-11-08: qty 2

## 2017-11-08 MED ORDER — FLUTICASONE PROPIONATE 50 MCG/ACT NA SUSP
2.0000 | Freq: Two times a day (BID) | NASAL | Status: DC
  Administered 2017-11-08 – 2017-11-14 (×7): 2 via NASAL
  Filled 2017-11-08: qty 16

## 2017-11-08 MED ORDER — ONDANSETRON HCL 4 MG/2ML IJ SOLN: 4.0000 mg | Freq: Four times a day (QID) | INTRAMUSCULAR | Status: DC | PRN

## 2017-11-08 MED ORDER — DILTIAZEM HCL 25 MG/5ML IV SOLN
10.0000 mg | Freq: Once | INTRAVENOUS | Status: DC
  Filled 2017-11-08: qty 5

## 2017-11-08 MED ORDER — RIVAROXABAN 20 MG PO TABS
20.0000 mg | ORAL_TABLET | Freq: Every day | ORAL | Status: DC
  Administered 2017-11-08 – 2017-11-10 (×3): 20 mg via ORAL
  Filled 2017-11-08 (×3): qty 1

## 2017-11-08 MED ORDER — IPRATROPIUM-ALBUTEROL 0.5-2.5 (3) MG/3ML IN SOLN
3.0000 mL | Freq: Four times a day (QID) | RESPIRATORY_TRACT | Status: DC
  Administered 2017-11-09 (×4): 3 mL via RESPIRATORY_TRACT
  Filled 2017-11-08 (×4): qty 3

## 2017-11-08 MED ORDER — POTASSIUM CHLORIDE 10 MEQ/100ML IV SOLN
10.0000 meq | INTRAVENOUS | Status: DC
  Administered 2017-11-08: 10 meq via INTRAVENOUS
  Filled 2017-11-08 (×2): qty 100

## 2017-11-08 MED ORDER — LEVOTHYROXINE SODIUM 25 MCG PO TABS
137.0000 ug | ORAL_TABLET | Freq: Every day | ORAL | Status: DC
  Administered 2017-11-09 – 2017-11-15 (×7): 137 ug via ORAL
  Filled 2017-11-08 (×8): qty 1

## 2017-11-08 MED ORDER — MIRTAZAPINE 15 MG PO TABS
15.0000 mg | ORAL_TABLET | Freq: Every day | ORAL | Status: DC
  Administered 2017-11-08 – 2017-11-14 (×7): 15 mg via ORAL
  Filled 2017-11-08 (×7): qty 1

## 2017-11-08 MED ORDER — SODIUM CHLORIDE 0.9 % IV BOLUS (SEPSIS)
500.0000 mL | Freq: Once | INTRAVENOUS | Status: AC
  Administered 2017-11-08: 500 mL via INTRAVENOUS

## 2017-11-08 MED ORDER — DILTIAZEM HCL-DEXTROSE 100-5 MG/100ML-% IV SOLN (PREMIX): 5.0000 mg/h | INTRAVENOUS | Status: DC

## 2017-11-08 MED ORDER — ALBUTEROL SULFATE (2.5 MG/3ML) 0.083% IN NEBU: 2.5000 mg | INHALATION_SOLUTION | Freq: Four times a day (QID) | RESPIRATORY_TRACT | Status: DC | PRN

## 2017-11-08 MED ORDER — SODIUM CHLORIDE 0.9 % IV BOLUS (SEPSIS)
1000.0000 mL | Freq: Once | INTRAVENOUS | Status: AC
  Administered 2017-11-08: 1000 mL via INTRAVENOUS

## 2017-11-08 MED ORDER — DEXTROSE 5 % IV SOLN
2.0000 g | Freq: Two times a day (BID) | INTRAVENOUS | Status: DC
  Administered 2017-11-08 – 2017-11-11 (×6): 2 g via INTRAVENOUS
  Filled 2017-11-08 (×6): qty 2

## 2017-11-08 MED ORDER — OXYCODONE HCL 5 MG PO TABS
10.0000 mg | ORAL_TABLET | ORAL | Status: DC | PRN
  Administered 2017-11-08 – 2017-11-09 (×2): 10 mg via ORAL
  Filled 2017-11-08 (×2): qty 2

## 2017-11-08 MED ORDER — DEXTROSE 5 % IV SOLN
2.0000 g | Freq: Once | INTRAVENOUS | Status: AC
  Administered 2017-11-08: 2 g via INTRAVENOUS
  Filled 2017-11-08: qty 2

## 2017-11-08 MED ORDER — ENSURE ENLIVE PO LIQD
237.0000 mL | Freq: Two times a day (BID) | ORAL | Status: DC
  Administered 2017-11-09 – 2017-11-15 (×8): 237 mL via ORAL

## 2017-11-08 MED ORDER — VANCOMYCIN HCL IN DEXTROSE 750-5 MG/150ML-% IV SOLN
750.0000 mg | INTRAVENOUS | Status: DC
  Administered 2017-11-09 – 2017-11-10 (×2): 750 mg via INTRAVENOUS
  Filled 2017-11-08 (×3): qty 150

## 2017-11-08 MED ORDER — VANCOMYCIN HCL IN DEXTROSE 1-5 GM/200ML-% IV SOLN
1000.0000 mg | Freq: Once | INTRAVENOUS | Status: AC
  Administered 2017-11-08: 1000 mg via INTRAVENOUS
  Filled 2017-11-08: qty 200

## 2017-11-08 NOTE — H&P (Addendum)
[facility] History and Physical  [name] YNW:[id number] DOB: 1932-11-14 DOA: 11/08/2017  Referring physician:   PCP: Asencion Noble, MD   Chief Complaint  tachycardia  HPI:   84 year old female with a history of ovarian ca , status post chemotherapy on Monday this week, presents today with chief complaint of heart racing, she has a known history of A. fib and is on anticoagulation with Xarelto. Patient felt her heart racing at around 7:30 this morning. When questioned she states that she has had pretty much every known side effect from her chemotherapy.. She's been dizzy, lightheaded, weak, she's had some diarrhea this week. Her dyspnea is at its baseline. She has had a nonproductive cough. She denies any pleuritic chest pain. She's  denies any fevers, chills. ED course BP 102/69 (BP Location: Right Arm)   Pulse (!) 169   Temp 97.9 F (36.6 C) (Oral)   Resp 16   SpO2 98%  White blood cell count 21.4, EKG shows atrial fibrillation with rapid ventricular response rate of 169. Chest x-ray shows possible right lower lobe infiltrate and worsening pleural effusion, started on vancomycin and cefepime for possibility of pneumonia in the right lower lobe, she does not appear to be septic. She is being admitted to stepdown for atrial fibrillation with rapid ventricular response and she is being started on a Cardizem drip      Review of Systems: negative for the following  Constitutional: Denies fever, chills, diaphoresis, appetite change and fatigue.  HEENT: Denies photophobia, eye pain, redness, hearing loss, ear pain, congestion, sore throat, rhinorrhea, sneezing, mouth sores, trouble swallowing, neck pain, neck stiffness and tinnitus.  Respiratory: Positive for cough and shortness of breath Cardiovascular: Denies chest pain, positive for palpitations and  denies leg swelling.  Gastrointestinal: Denies nausea, vomiting, abdominal pain, diarrhea, constipation, blood in stool and  abdominal distention.  Genitourinary: Denies dysuria, urgency, frequency, hematuria, flank pain and difficulty urinating.  Musculoskeletal: Denies myalgias, back pain, joint swelling, arthralgias and gait problem.  Skin: Denies pallor, rash and wound.  Neurological: Denies dizziness, seizures, syncope, weakness, light-headedness, numbness and headaches.  Hematological: Denies adenopathy. Easy bruising, personal or family bleeding history  Psychiatric/Behavioral: Denies suicidal ideation, mood changes, confusion, nervousness, sleep disturbance and agitation       Past Medical History:  Diagnosis Date  . COPD (chronic obstructive pulmonary disease) ([place])   . Depression   . Diverticulosis   . Family hx of colon cancer   . Fibromyalgia   . GERD (gastroesophageal reflux disease)   . Hemorrhoids   . Hiatal hernia   . History of shingles   . Hypothyroidism   . IBS (irritable bowel syndrome)   . Lymphocytic colitis   . Osteoporosis   . Ovarian cancer ([place])   . Paroxysmal atrial fibrillation ([facility])   . Psoriatic arthritis ([place])   . Rectal polyp   . Schatzki's ring   . Uterine polyp      Past Surgical History:  Procedure Laterality Date  . CHOLECYSTECTOMY  Dec 04, 2010  . COLONOSCOPY    . FEMUR IM NAIL Left 12/11/2013   Procedure: INTRAMEDULLARY (IM) NAIL FEMORAL;  Surgeon: [name], MD;  Location: [facility];  Service: Orthopedics;  Laterality: Left;  . FRACTURE SURGERY Left 12/11/2013   hip  . IR FLUORO GUIDE PORT INSERTION RIGHT  07/22/2017  . IR PARACENTESIS  07/12/2017  . IR US GUIDE VASC ACCESS RIGHT  07/22/2017  . KNEE SURGERY Left    x 2  .  TOTAL KNEE REVISION  08/06/2012   Procedure: TOTAL KNEE REVISION;  Surgeon: [name], MD;  Location: [facility];  Service: Orthopedics;  Laterality: Left;  Left Total Knee Arthroplasty Revision  . Uterine polypectomy        Social History:  reports that she quit smoking about 37 years ago. Her smoking use included  cigarettes. She has a 20.00 pack-year smoking history. she has never used smokeless tobacco. She reports that she drinks alcohol. She reports that she does not use drugs.    Allergies  Allergen Reactions  . Codeine Itching    Family History  Problem Relation Age of Onset  . Heart disease Father        pacemaker  . Lung cancer Father        hx of smoker  . Colon cancer Mother        dx in her mid 81's  . Colon cancer Maternal Grandfather   . Depression Sister         Prior to Admission medications   Medication Sig Start Date End Date Taking? Authorizing Provider  albuterol (PROAIR HFA) 108 (90 Base) MCG/ACT inhaler Inhale 2 puffs into the lungs every 6 (six) hours as needed for wheezing or shortness of breath. 06/19/17  Yes Collene Gobble, MD  ALPRAZolam Duanne Moron) 0.25 MG tablet Take 1 tablet (0.25 mg total) by mouth at bedtime as needed for anxiety. 08/27/17  Yes Gorsuch, Ni, MD  azelastine (ASTELIN) 0.1 % nasal spray Place 2 sprays into both nostrils 2 (two) times daily as needed for allergies.    Yes [provider]  budesonide-formoterol (SYMBICORT) 160-4.5 MCG/ACT inhaler Inhale 2 puffs into the lungs 2 (two) times daily. 07/02/17  Yes Collene Gobble, MD  Cholecalciferol (VITAMIN D) 2000 units CAPS Take 2,000 Units by mouth daily.   Yes [provider]  diltiazem (CARTIA XT) 120 MG 24 hr capsule TAKE 1 CAPSULE BY MOUTH EVERY DAY 05/03/17  Yes Satira Sark, MD  diphenoxylate-atropine (LOMOTIL) 2.5-0.025 MG tablet Take 1 tablet by mouth 4 (four) times daily. Patient taking differently: Take 1 tablet by mouth 4 (four) times daily as needed for diarrhea or loose stools.  08/21/16  Yes [name], MD  fluticasone [facility]) 50 MCG/ACT nasal spray Place 2 sprays into both nostrils 2 (two) times daily. For nasal congestion 11/20/13  Yes [provider]  furosemide (LASIX) 20 MG tablet Take 1 tablet (20 mg total) by mouth daily. 11/04/17  Yes Gorsuch, Ni, MD   levothyroxine (SYNTHROID, LEVOTHROID) 137 MCG tablet Take 137 mcg by mouth every morning. For thyroid therapy   Yes [provider]  lidocaine-prilocaine (EMLA) cream Apply 1 application topically as needed. 07/30/17  Yes Gorsuch, Ni, MD  loperamide (IMODIUM) 2 MG capsule Take 2 capsules (4 mg total) by mouth 3 (three) times daily. Patient taking differently: Take 4 mg by mouth as needed for diarrhea or loose stools.  07/24/17  Yes Gorsuch, Ernst Spell, MD  magic mouthwash w/lidocaine SOLN Take 5 mLs by mouth 4 (four) times daily. 5 ml QID swish and spit Patient taking differently: Take 5 mLs by mouth 4 (four) times daily as needed for mouth pain. 5 ml QID swish and spit 10/17/17  Yes Gorsuch, Ni, MD  methylcellulose (ARTIFICIAL TEARS) 1 % ophthalmic solution Place 1 drop into both eyes 2 (two) times daily as needed (for dry eyes).    Yes [provider]  mirtazapine (REMERON) 15 MG tablet Take 1 tablet (  15 mg total) by mouth at bedtime. 11/04/17  Yes Gorsuch, Ni, MD  ondansetron (ZOFRAN) 8 MG tablet Take 1 tablet (8 mg total) by mouth every 8 (eight) hours as needed for nausea or vomiting. 10/14/17  Yes Gorsuch, Ni, MD  oxyCODONE 10 MG TABS Take 1 tablet (10 mg total) by mouth every 4 (four) hours as needed for severe pain. 11/04/17  Yes Gorsuch, Ni, MD  prochlorperazine (COMPAZINE) 10 MG tablet Take 10 mg by mouth every 6 (six) hours as needed for nausea. 07/05/17  Yes [provider]  senna-docusate (SENOKOT-S) 8.6-50 MG tablet Take 1 tablet by mouth at bedtime. 07/24/17  Yes Gorsuch, Ni, MD  XARELTO 20 MG TABS tablet TAKE 1 TABLET BY MOUTH EVERY DAY WITH SUPPER 04/23/17  Yes Satira Sark, MD  Calcium Carbonate (CALCIUM 600) 1500 MG TABS Take 2 tablets by mouth daily.    12/18/11  [provider]     Physical Exam: Vitals:   11/08/17 1037 11/08/17 1045 11/08/17 1155 11/08/17 1215  BP: 98/63 116/61 131/66 127/67  Pulse: (!) 109 (!) 110 (!) 122 (!) 127  Resp: 17 13  19 17   Temp:      TempSrc:      SpO2: 99% 98% 99% 98%        Vitals:   11/08/17 1037 11/08/17 1045 11/08/17 1155 11/08/17 1215  BP: 98/63 116/61 131/66 127/67  Pulse: (!) 109 (!) 110 (!) 122 (!) 127  Resp: 17 13 19 17   Temp:      TempSrc:      SpO2: 99% 98% 99% 98%   Constitutional: NAD, calm, comfortable Eyes: PERRL, lids and conjunctivae normal ENMT: Mucous membranes are moist. Posterior pharynx clear of any exudate or lesions.Normal dentition.  Neck: normal, supple, no masses, no thyromegaly Respiratory: Decreased breath sounds at the right base to auscultation , no wheezing, no crackles. Normal respiratory effort. No accessory muscle use.  Cardiovascular: Irregularly irregular, no murmurs / rubs / gallops. No extremity edema. 2+ pedal pulses. No carotid bruits.  Abdomen: no tenderness, no masses palpated. No hepatosplenomegaly. Bowel sounds positive.  Musculoskeletal: no clubbing / cyanosis. No joint deformity upper and lower extremities. Good ROM, no contractures. Normal muscle tone.  Skin: no rashes, lesions, ulcers. No induration Neurologic: CN 2-12 grossly intact. Sensation intact, DTR normal. Strength 5/5 in all 4.  Psychiatric: Normal judgment and insight. Alert and oriented x 3. Normal mood.     Labs on Admission: I have personally reviewed following labs and imaging studies  CBC: Recent Labs  Lab 11/04/17 0853 11/08/17 1005  WBC 5.2 21.4*  NEUTROABS 4.7 20.1*  HGB 11.0* 11.0*  HCT 32.3* 32.7*  MCV 95.0 96.7  PLT 101* 121*    Basic Metabolic Panel: Recent Labs  Lab 11/04/17 0853 11/08/17 1005  NA 136 138  K 4.1 3.0*  CL 100 97*  CO2 27 32  GLUCOSE 227* 132*  BUN 11 22*  CREATININE 0.83 0.64  CALCIUM 9.2 8.8*    GFR: Estimated Creatinine Clearance: 53.5 mL/min (by C-G formula based on SCr of 0.64 mg/dL).  Liver Function Tests: Recent Labs  Lab 11/04/17 0853  AST 11  ALT 9  ALKPHOS 61  BILITOT 0.3  PROT 7.0  ALBUMIN 3.3*   No results  for input(s): LIPASE, AMYLASE in the last 168 hours. No results for input(s): AMMONIA in the last 168 hours.  Coagulation Profile: No results for input(s): INR, PROTIME in the last 168 hours. No results for  input(s): DDIMER in the last 72 hours.  Cardiac Enzymes: No results for input(s): CKTOTAL, CKMB, CKMBINDEX, TROPONINI in the last 168 hours.  BNP (last 3 results) Recent Labs    08/12/17 1137  PROBNP 837*    HbA1C: No results for input(s): HGBA1C in the last 72 hours. Lab Results  Component Value Date   HGBA1C 5.7 (H) 06/12/2013     CBG: No results for input(s): GLUCAP in the last 168 hours.  Lipid Profile: No results for input(s): CHOL, HDL, LDLCALC, TRIG, CHOLHDL, LDLDIRECT in the last 72 hours.  Thyroid Function Tests: No results for input(s): TSH, T4TOTAL, FREET4, T3FREE, THYROIDAB in the last 72 hours.  Anemia Panel: No results for input(s): VITAMINB12, FOLATE, FERRITIN, TIBC, IRON, RETICCTPCT in the last 72 hours.  Urine analysis:    Component Value Date/Time   COLORURINE YELLOW 06/30/2017 1235   APPEARANCEUR HAZY (A) 06/30/2017 1235   LABSPEC 1.030 10/11/2017 1125   PHURINE 6.0 10/11/2017 1125   PHURINE 6.0 06/30/2017 1235   GLUCOSEU Negative 10/11/2017 1125   HGBUR Small 10/11/2017 1125   HGBUR MODERATE (A) 06/30/2017 1235   BILIRUBINUR Negative 10/11/2017 1125   KETONESUR Negative 10/11/2017 1125   KETONESUR 40 (A) 06/30/2017 1235   PROTEINUR 30 10/11/2017 1125   PROTEINUR 100 (A) 06/30/2017 1235   UROBILINOGEN 0.2 10/11/2017 1125   NITRITE Negative 10/11/2017 1125   NITRITE NEGATIVE 06/30/2017 1235   LEUKOCYTESUR Negative 10/11/2017 1125    Sepsis Labs: @LABRCNTIP (procalcitonin:4,lacticidven:4) )No results found for this or any previous visit (from the past 240 hour(s)).       Radiological Exams on Admission: Dg Chest 2 View  Result Date: 11/08/2017 CLINICAL DATA:  Tachycardia. EXAM: CHEST  2 VIEW COMPARISON:  09/24/2017. FINDINGS:  PowerPort catheter noted with tip over the right atrium. Heart size normal. Atelectatic changes right mid and lower lung. Possible right base infiltrate. Follow-up exam suggested demonstrate clearing. Small right pleural effusion. No pneumothorax. Diffuse thoracic spine osteopenia degenerative change. IMPRESSION: 1.  PowerPort catheter noted with tip over the right atrium. 2. Right mid lung and right base atelectatic changes. Right base infiltrate cannot be excluded. Small right pleural effusion. Follow-up chest x-rays to demonstrate clearing suggested. Electronically Signed   By: Marcello Moores  Register   On: 11/08/2017 10:29   Dg Chest 2 View  Result Date: 11/08/2017 CLINICAL DATA:  Tachycardia. EXAM: CHEST  2 VIEW COMPARISON:  09/24/2017. FINDINGS: PowerPort catheter noted with tip over the right atrium. Heart size normal. Atelectatic changes right mid and lower lung. Possible right base infiltrate. Follow-up exam suggested demonstrate clearing. Small right pleural effusion. No pneumothorax. Diffuse thoracic spine osteopenia degenerative change. IMPRESSION: 1.  PowerPort catheter noted with tip over the right atrium. 2. Right mid lung and right base atelectatic changes. Right base infiltrate cannot be excluded. Small right pleural effusion. Follow-up chest x-rays to demonstrate clearing suggested. Electronically Signed   By: Marcello Moores  Register   On: 11/08/2017 10:29      EKG: Independently reviewed.  Atrial fibrillation with rapid ventricular response  Assessment/Plan Principal Problem: Healthcare associated pneumonia/acute on chronic COPD exacerbation/possible right lower lobe pneumonia/right-sided pleural effusion Patient will be started antibiotics for HCAP Supportive therapy with nebulizer treatments, oxygen Will start patient on low-dose IV steroids Continue Dulera, Flonase CT chest to evaluate the size of right pleural effusion and also to assess for right lower lobe infiltrate Check  pro-calcitonin, check MRSA PCR, follow results of blood culture, RVP Does not appear to be septic Oxygen requirements at  baseline  Atrial fibrillation with rapid ventricular response Patient  Placed on Cardizem drip but soon converted to NSR  Patient is anticoagulated on Xarelto Check TSH Replete potassium Check magnesium Resume PO cardizem    [place] and cancer Patient undergoing chemotherapy, last treatment was 1/28  Depression Denies suicidal ideation or plan continue Remeron    DVT prophylaxis:  Xarelto   CODE STATUS full code       consults called: None  Family Communication: Admission, patients condition and plan of care including tests being ordered have been discussed with the patient  who indicates understanding and agree with the plan and Code Status  Admission status: inpatient  , stepdown  Disposition plan: Further plan will depend as patient's clinical course evolves and further radiologic and laboratory data become available. Likely home when stable   At the time of admission, it appears that the appropriate admission status for this patient is INPATIENT .Thisis judged to be reasonable and necessary in order to provide the required intensity of service to ensure the patient's safetygiven thepresenting symptoms, physical exam findings, and initial radiographic and laboratory data in the context of their chronic comorbidities.   Reyne Dumas MD [facility] Pager [phone]  If 7PM-7AM, please contact night-coverage [email or web address] Password [email or web address]  11/08/2017, 12:30 PM

## 2017-11-08 NOTE — ED Notes (Signed)
ED TO INPATIENT HANDOFF REPORT  Name/Age/Gender [name] 84 y.o. female  Code Status Code Status History    Date Active Date Inactive Code Status Order ID Comments User Context   07/18/2017 14:05 07/24/2017 17:17 Full Code [id number]  Heath Lark, MD Inpatient   07/07/2017 20:23 07/10/2017 17:49 Full Code [id number]  Theodis Blaze, MD ED   06/30/2017 17:09 07/01/2017 17:14 Full Code [id number]  Donne Hazel, MD ED   12/11/2013 18:42 12/14/2013 18:15 Full Code [id number]  [name], MD Inpatient   12/08/2013 20:38 12/11/2013 18:42 Full Code [id number]  Kinnie Feil, MD Inpatient   08/06/2012 13:17 08/09/2012 14:22 Full Code [id number]  Denton Meek, RN Inpatient    Advance Directive Documentation     Most Recent Value  Type of Advance Directive  Healthcare Power of Attorney, Living will  Pre-existing out of facility DNR order (yellow form or pink MOST form)  No data  "MOST" Form in Place?  No data      Home/SNF/Other Home  Chief Complaint fast heart beat  Level of Care/Admitting Diagnosis ED Disposition    ED Disposition Condition Comment   Admit  Hospital Area: [place] [[id number]]  Level of Care: Telemetry [5]  Admit to tele based on following criteria: Complex arrhythmia (Bradycardia/Tachycardia)  Diagnosis: HCAP (healthcare-associated pneumonia) [253664]  Admitting Physician: Rise Patience [phone]  Attending Physician: Rise Patience [phone]  Estimated length of stay: past midnight tomorrow  Certification:: I certify this patient will need inpatient services for at least 2 midnights  PT Class (Do Not Modify): Inpatient [101]  PT Acc Code (Do Not Modify): Private [1]       Medical History Past Medical History:  Diagnosis Date  . COPD (chronic obstructive pulmonary disease) ([place])   . Depression   . Diverticulosis   . Family hx of colon cancer   . Fibromyalgia   . GERD (gastroesophageal reflux disease)   . Hemorrhoids   .  Hiatal hernia   . History of shingles   . Hypothyroidism   . IBS (irritable bowel syndrome)   . Lymphocytic colitis   . Osteoporosis   . Ovarian cancer ([place])   . Paroxysmal atrial fibrillation ([facility])   . Psoriatic arthritis ([place])   . Rectal polyp   . Schatzki's ring   . Uterine polyp     Allergies Allergies  Allergen Reactions  . Codeine Itching    IV Location/Drains/Wounds Patient Lines/Drains/Airways Status   Active Line/Drains/Airways    Name:   Placement date:   Placement time:   Site:   Days:   Implanted Port 07/22/17 Right Chest   07/22/17    1319    Chest   109          Labs/Imaging Results for orders placed or performed during the hospital encounter of 11/08/17 (from the past 48 hour(s))  Basic metabolic panel     Status: Abnormal   Collection Time: 11/08/17 10:05 AM  Result Value Ref Range   Sodium 138 135 - 145 mmol/L   Potassium 3.0 (L) 3.5 - 5.1 mmol/L   Chloride 97 (L) 101 - 111 mmol/L   CO2 32 22 - 32 mmol/L   Glucose, Bld 132 (H) 65 - 99 mg/dL   BUN 22 (H) 6 - 20 mg/dL   Creatinine, Ser 0.64 0.44 - 1.00 mg/dL   Calcium 8.8 (L) 8.9 - 10.3 mg/dL   GFR calc non Af Amer >60 >60 mL/min  GFR calc Af Amer >60 >60 mL/min    Comment: (NOTE) The eGFR has been calculated using the CKD EPI equation. This calculation has not been validated in all clinical situations. eGFR's persistently <60 mL/min signify possible Chronic Kidney Disease.    Anion gap 9 5 - 15    Comment: Performed at [facility], [place] [address] [place], [place] [address]  CBC with Differential     Status: Abnormal   Collection Time: 11/08/17 10:05 AM  Result Value Ref Range   WBC 21.4 (H) 4.0 - 10.5 K/uL   RBC 3.38 (L) 3.87 - 5.11 MIL/uL   Hemoglobin 11.0 (L) 12.0 - 15.0 g/dL   HCT 32.7 (L) 36.0 - 46.0 %   MCV 96.7 78.0 - 100.0 fL   MCH 32.5 26.0 - 34.0 pg   MCHC 33.6 30.0 - 36.0 g/dL   RDW 14.6 11.5 - 15.5 %   Platelets 121 (L) 150 - 400 K/uL   Neutrophils  Relative % 95 %   Neutro Abs 20.1 (H) 1.7 - 7.7 K/uL   Lymphocytes Relative 5 %   Lymphs Abs 1.2 0.7 - 4.0 K/uL   Monocytes Relative 0 %   Monocytes Absolute 0.0 (L) 0.1 - 1.0 K/uL   Eosinophils Relative 0 %   Eosinophils Absolute 0.0 0.0 - 0.7 K/uL   Basophils Relative 0 %   Basophils Absolute 0.0 0.0 - 0.1 K/uL    Comment: Performed at [facility], [place] [address] [place], [place] [address]  Procalcitonin - Baseline     Status: None   Collection Time: 11/08/17 10:05 AM  Result Value Ref Range   Procalcitonin 0.31 ng/mL    Comment:        Interpretation: PCT (Procalcitonin) <= 0.5 ng/mL: Systemic infection (sepsis) is not likely. Local bacterial infection is possible. (NOTE)       Sepsis PCT Algorithm           Lower Respiratory Tract                                      Infection PCT Algorithm    ----------------------------     ----------------------------         PCT < 0.25 ng/mL                PCT < 0.10 ng/mL         Strongly encourage             Strongly discourage   discontinuation of antibiotics    initiation of antibiotics    ----------------------------     -----------------------------       PCT 0.25 - 0.50 ng/mL            PCT 0.10 - 0.25 ng/mL               OR       >80% decrease in PCT            Discourage initiation of                                            antibiotics      Encourage discontinuation           of antibiotics    ----------------------------     -----------------------------  PCT >= 0.50 ng/mL              PCT 0.26 - 0.50 ng/mL               AND        <80% decrease in PCT             Encourage initiation of                                             antibiotics       Encourage continuation           of antibiotics    ----------------------------     -----------------------------        PCT >= 0.50 ng/mL                  PCT > 0.50 ng/mL               AND         increase in PCT                  Strongly  encourage                                      initiation of antibiotics    Strongly encourage escalation           of antibiotics                                     -----------------------------                                           PCT <= 0.25 ng/mL                                                 OR                                        > 80% decrease in PCT                                     Discontinue / Do not initiate                                             antibiotics Performed at [facility] [address] [place], [place] [address]   Magnesium     Status: Abnormal   Collection Time: 11/08/17 10:05 AM  Result Value Ref Range   Magnesium 1.5 (L) 1.7 - 2.4 mg/dL    Comment: Performed at [facility], [place] [address] [place], [place] [address]  I-Stat CG4 Lactic Acid,  ED  (not at  [facility])     Status: Abnormal   Collection Time: 11/08/17 11:21 AM  Result Value Ref Range   Lactic Acid, Venous 1.94 (H) 0.5 - 1.9 mmol/L  I-Stat CG4 Lactic Acid, ED  (not at  [facility])     Status: None   Collection Time: 11/08/17  1:56 PM  Result Value Ref Range   Lactic Acid, Venous 0.72 0.5 - 1.9 mmol/L  Basic metabolic panel     Status: Abnormal   Collection Time: 11/08/17  4:49 PM  Result Value Ref Range   Sodium 138 135 - 145 mmol/L   Potassium 3.9 3.5 - 5.1 mmol/L    Comment: DELTA CHECK NOTED NO VISIBLE HEMOLYSIS    Chloride 104 101 - 111 mmol/L   CO2 28 22 - 32 mmol/L   Glucose, Bld 138 (H) 65 - 99 mg/dL   BUN 18 6 - 20 mg/dL   Creatinine, Ser 0.43 (L) 0.44 - 1.00 mg/dL   Calcium 8.1 (L) 8.9 - 10.3 mg/dL   GFR calc non Af Amer >60 >60 mL/min   GFR calc Af Amer >60 >60 mL/min    Comment: (NOTE) The eGFR has been calculated using the CKD EPI equation. This calculation has not been validated in all clinical situations. eGFR's persistently <60 mL/min signify possible Chronic Kidney Disease.    Anion gap 6 5 - 15    Comment: Performed at  [facility], [place] [address] [place], [place] [address]   Dg Chest 2 View  Result Date: 11/08/2017 CLINICAL DATA:  Tachycardia. EXAM: CHEST  2 VIEW COMPARISON:  09/24/2017. FINDINGS: PowerPort catheter noted with tip over the right atrium. Heart size normal. Atelectatic changes right mid and lower lung. Possible right base infiltrate. Follow-up exam suggested demonstrate clearing. Small right pleural effusion. No pneumothorax. Diffuse thoracic spine osteopenia degenerative change. IMPRESSION: 1.  PowerPort catheter noted with tip over the right atrium. 2. Right mid lung and right base atelectatic changes. Right base infiltrate cannot be excluded. Small right pleural effusion. Follow-up chest x-rays to demonstrate clearing suggested. Electronically Signed   By: Marcello Moores  Register   On: 11/08/2017 10:29   Ct Angio Chest Pe W Or Wo Contrast  Result Date: 11/08/2017 CLINICAL DATA:  Patient is undergoing chemotherapy for ovarian cancer. No history of atrial fibrillation. Weakness, dizziness, productive cough, pleuritic chest pain. EXAM: CT ANGIOGRAPHY CHEST WITH CONTRAST TECHNIQUE: Multidetector CT imaging of the chest was performed using the standard protocol during bolus administration of intravenous contrast. Multiplanar CT image reconstructions and MIPs were obtained to evaluate the vascular anatomy. CONTRAST:  100 cc Isovue 370 intravenously. COMPARISON:  09/18/2017 FINDINGS: Cardiovascular: Satisfactory opacification of the pulmonary arteries to the segmental level. No evidence of pulmonary embolism. Normal heart size. No pericardial effusion. Injectable port terminates within the right atrium. Calcific atherosclerotic disease of the aorta and coronary arteries, mild. Mediastinum/Nodes: No enlarged mediastinal, hilar, or axillary lymph nodes. Thyroid gland, trachea, and esophagus demonstrate no significant findings. Lungs/Pleura: Enlarged water density right pleural effusion. Right middle  lobe airspace consolidation versus segmental atelectasis. Platelike atelectasis in the right lower lobe. Minimal atelectatic changes in the left lower lobe and lingula. Upper Abdomen: No acute abnormality. Musculoskeletal: No chest wall abnormality. No acute or significant osseous findings. Review of the MIP images confirms the above findings. IMPRESSION: Airspace consolidation versus right medial segmental atelectasis of the right middle lobe. Interval increase in the size of right pleural effusion. Platelike areas of atelectasis in the  bilateral lower lobes and lingula. Electronically Signed   By: Fidela Salisbury M.D.   On: 11/08/2017 15:16    Pending Labs Unresulted Labs (From admission, onward)   Start     Ordered   11/09/17 0500  Procalcitonin  Daily,   R     11/08/17 1201   11/09/17 0500  CBC  Tomorrow morning,   R     11/08/17 1237   11/09/17 0500  Comprehensive metabolic panel  Tomorrow morning,   R     11/08/17 1237   11/08/17 1240  TSH  Add-on,   R     11/08/17 1239   11/08/17 1201  MRSA PCR Screening  Once,   R     11/08/17 1201   11/08/17 1200  Respiratory Panel by PCR  (Respiratory virus panel)  Once,   R     11/08/17 1159   11/08/17 1037  Blood Culture (routine x 2)  BLOOD CULTURE X 2,   STAT     11/08/17 1036      Vitals/Pain Today's Vitals   11/08/17 1915 11/08/17 1930 11/08/17 1945 11/08/17 2017  BP: 129/64 (!) 130/59 (!) 127/56 (!) 125/57  Pulse: 81 81 75 73  Resp: 10 16 17 20   Temp:      TempSrc:      SpO2: 99% 98% 99% 99%    Isolation Precautions Droplet precaution  Medications Medications  ondansetron (ZOFRAN) injection 4 mg (not administered)  levalbuterol (XOPENEX) nebulizer solution 0.63 mg (not administered)  ceFEPIme (MAXIPIME) 2 g in dextrose 5 % 50 mL IVPB (not administered)  vancomycin (VANCOCIN) IVPB 750 mg/150 ml premix (not administered)  diltiazem (CARDIZEM) tablet 30 mg (30 mg Oral Given 11/08/17 1654)  sodium chloride 0.9 % bolus 1,000  mL (0 mLs Intravenous Stopped 11/08/17 1226)  ceFEPIme (MAXIPIME) 2 g in dextrose 5 % 50 mL IVPB (0 g Intravenous Stopped 11/08/17 1214)  vancomycin (VANCOCIN) IVPB 1000 mg/200 mL premix (0 mg Intravenous Stopped 11/08/17 1336)  potassium chloride SA (K-DUR,KLOR-CON) CR tablet 40 mEq (40 mEq Oral Given 11/08/17 1121)  sodium chloride 0.9 % bolus 500 mL (0 mLs Intravenous Stopped 11/08/17 1336)  potassium chloride SA (K-DUR,KLOR-CON) CR tablet 40 mEq (40 mEq Oral Given 11/08/17 1655)  magnesium sulfate IVPB 2 g 50 mL (0 g Intravenous Stopped 11/08/17 1641)  sodium chloride 0.9 % bolus 500 mL (0 mLs Intravenous Stopped 11/08/17 1641)  iopamidol (ISOVUE-370) 76 % injection (100 mLs Intravenous Contrast Given 11/08/17 1449)    Mobility non-ambulatory

## 2017-11-08 NOTE — ED Notes (Signed)
ED TO INPATIENT HANDOFF REPORT  Name/Age/Gender [name] 84 y.o. female  Code Status Code Status History    Date Active Date Inactive Code Status Order ID Comments User Context   07/18/2017 14:05 07/24/2017 17:17 Full Code [id number]  Heath Lark, MD Inpatient   07/07/2017 20:23 07/10/2017 17:49 Full Code [id number]  Theodis Blaze, MD ED   06/30/2017 17:09 07/01/2017 17:14 Full Code [id number]  Donne Hazel, MD ED   12/11/2013 18:42 12/14/2013 18:15 Full Code [id number]  [name], MD Inpatient   12/08/2013 20:38 12/11/2013 18:42 Full Code [id number]  Kinnie Feil, MD Inpatient   08/06/2012 13:17 08/09/2012 14:22 Full Code [id number]  Denton Meek, RN Inpatient    Advance Directive Documentation     Most Recent Value  Type of Advance Directive  Healthcare Power of Attorney, Living will  Pre-existing out of facility DNR order (yellow form or pink MOST form)  No data  "MOST" Form in Place?  No data      Home/SNF/Other Home  Chief Complaint fast heart beat  Level of Care/Admitting Diagnosis ED Disposition    ED Disposition Condition [facility]: [place] [[id number]]  Level of Care: Stepdown [14]  Admit to SDU based on following criteria: Hemodynamic compromise or significant risk of instability:  Patient requiring short term acute titration and management of vasoactive drips, and invasive monitoring (i.e., CVP and Arterial line).  Diagnosis: Atrial fibrillation with RVR [facility]) [[id number]]  Admitting Physician: Reyne Dumas [[id number]]  Attending Physician: Reyne Dumas [[id number]]  Estimated length of stay: past midnight tomorrow  Certification:: I certify this patient will need inpatient services for at least 2 midnights  PT Class (Do Not Modify): Inpatient [101]  PT Acc Code (Do Not Modify): Private [1]       Medical History Past Medical History:  Diagnosis Date  . COPD (chronic obstructive pulmonary disease) ([place])   . Depression   .  Diverticulosis   . Family hx of colon cancer   . Fibromyalgia   . GERD (gastroesophageal reflux disease)   . Hemorrhoids   . Hiatal hernia   . History of shingles   . Hypothyroidism   . IBS (irritable bowel syndrome)   . Lymphocytic colitis   . Osteoporosis   . Ovarian cancer ([place])   . Paroxysmal atrial fibrillation ([facility])   . Psoriatic arthritis ([place])   . Rectal polyp   . Schatzki's ring   . Uterine polyp     Allergies Allergies  Allergen Reactions  . Codeine Itching    IV Location/Drains/Wounds Patient Lines/Drains/Airways Status   Active Line/Drains/Airways    Name:   Placement date:   Placement time:   Site:   Days:   Implanted Port 07/22/17 Right Chest   07/22/17    1319    Chest   109          Labs/Imaging Results for orders placed or performed during the hospital encounter of 11/08/17 (from the past 48 hour(s))  Basic metabolic panel     Status: Abnormal   Collection Time: 11/08/17 10:05 AM  Result Value Ref Range   Sodium 138 135 - 145 mmol/L   Potassium 3.0 (L) 3.5 - 5.1 mmol/L   Chloride 97 (L) 101 - 111 mmol/L   CO2 32 22 - 32 mmol/L   Glucose, Bld 132 (H) 65 - 99 mg/dL   BUN 22 (H) 6 - 20 mg/dL   Creatinine, Ser 0.64  0.44 - 1.00 mg/dL   Calcium 8.8 (L) 8.9 - 10.3 mg/dL   GFR calc non Af Amer >60 >60 mL/min   GFR calc Af Amer >60 >60 mL/min    Comment: (NOTE) The eGFR has been calculated using the CKD EPI equation. This calculation has not been validated in all clinical situations. eGFR's persistently <60 mL/min signify possible Chronic Kidney Disease.    Anion gap 9 5 - 15    Comment: Performed at [facility], [place] [address], [place], [place] [address]  CBC with Differential     Status: Abnormal   Collection Time: 11/08/17 10:05 AM  Result Value Ref Range   WBC 21.4 (H) 4.0 - 10.5 K/uL   RBC 3.38 (L) 3.87 - 5.11 MIL/uL   Hemoglobin 11.0 (L) 12.0 - 15.0 g/dL   HCT 32.7 (L) 36.0 - 46.0 %   MCV 96.7 78.0 - 100.0 fL   MCH  32.5 26.0 - 34.0 pg   MCHC 33.6 30.0 - 36.0 g/dL   RDW 14.6 11.5 - 15.5 %   Platelets 121 (L) 150 - 400 K/uL   Neutrophils Relative % 95 %   Neutro Abs 20.1 (H) 1.7 - 7.7 K/uL   Lymphocytes Relative 5 %   Lymphs Abs 1.2 0.7 - 4.0 K/uL   Monocytes Relative 0 %   Monocytes Absolute 0.0 (L) 0.1 - 1.0 K/uL   Eosinophils Relative 0 %   Eosinophils Absolute 0.0 0.0 - 0.7 K/uL   Basophils Relative 0 %   Basophils Absolute 0.0 0.0 - 0.1 K/uL    Comment: Performed at [facility], [place] [address] [place], [place] [address]  Procalcitonin - Baseline     Status: None   Collection Time: 11/08/17 10:05 AM  Result Value Ref Range   Procalcitonin 0.31 ng/mL    Comment:        Interpretation: PCT (Procalcitonin) <= 0.5 ng/mL: Systemic infection (sepsis) is not likely. Local bacterial infection is possible. (NOTE)       Sepsis PCT Algorithm           Lower Respiratory Tract                                      Infection PCT Algorithm    ----------------------------     ----------------------------         PCT < 0.25 ng/mL                PCT < 0.10 ng/mL         Strongly encourage             Strongly discourage   discontinuation of antibiotics    initiation of antibiotics    ----------------------------     -----------------------------       PCT 0.25 - 0.50 ng/mL            PCT 0.10 - 0.25 ng/mL               OR       >80% decrease in PCT            Discourage initiation of                                            antibiotics  Encourage discontinuation           of antibiotics    ----------------------------     -----------------------------         PCT >= 0.50 ng/mL              PCT 0.26 - 0.50 ng/mL               AND        <80% decrease in PCT             Encourage initiation of                                             antibiotics       Encourage continuation           of antibiotics    ----------------------------     -----------------------------         PCT >= 0.50 ng/mL                  PCT > 0.50 ng/mL               AND         increase in PCT                  Strongly encourage                                      initiation of antibiotics    Strongly encourage escalation           of antibiotics                                     -----------------------------                                           PCT <= 0.25 ng/mL                                                 OR                                        > 80% decrease in PCT                                     Discontinue / Do not initiate                                             antibiotics Performed at [facility] [address], [place], [place] [address]   Magnesium     Status: Abnormal   Collection Time: 11/08/17 10:05 AM  Result Value Ref Range  Magnesium 1.5 (L) 1.7 - 2.4 mg/dL    Comment: Performed at [facility], [place] [address], [place], [place] [address]  I-Stat CG4 Lactic Acid, ED  (not at  [facility])     Status: Abnormal   Collection Time: 11/08/17 11:21 AM  Result Value Ref Range   Lactic Acid, Venous 1.94 (H) 0.5 - 1.9 mmol/L  I-Stat CG4 Lactic Acid, ED  (not at  [facility])     Status: None   Collection Time: 11/08/17  1:56 PM  Result Value Ref Range   Lactic Acid, Venous 0.72 0.5 - 1.9 mmol/L   Dg Chest 2 View  Result Date: 11/08/2017 CLINICAL DATA:  Tachycardia. EXAM: CHEST  2 VIEW COMPARISON:  09/24/2017. FINDINGS: PowerPort catheter noted with tip over the right atrium. Heart size normal. Atelectatic changes right mid and lower lung. Possible right base infiltrate. Follow-up exam suggested demonstrate clearing. Small right pleural effusion. No pneumothorax. Diffuse thoracic spine osteopenia degenerative change. IMPRESSION: 1.  PowerPort catheter noted with tip over the right atrium. 2. Right mid lung and right base atelectatic changes. Right base infiltrate cannot be excluded. Small right pleural effusion. Follow-up chest  x-rays to demonstrate clearing suggested. Electronically Signed   By: Marcello Moores  Register   On: 11/08/2017 10:29   Ct Angio Chest Pe W Or Wo Contrast  Result Date: 11/08/2017 CLINICAL DATA:  Patient is undergoing chemotherapy for ovarian cancer. No history of atrial fibrillation. Weakness, dizziness, productive cough, pleuritic chest pain. EXAM: CT ANGIOGRAPHY CHEST WITH CONTRAST TECHNIQUE: Multidetector CT imaging of the chest was performed using the standard protocol during bolus administration of intravenous contrast. Multiplanar CT image reconstructions and MIPs were obtained to evaluate the vascular anatomy. CONTRAST:  100 cc Isovue 370 intravenously. COMPARISON:  09/18/2017 FINDINGS: Cardiovascular: Satisfactory opacification of the pulmonary arteries to the segmental level. No evidence of pulmonary embolism. Normal heart size. No pericardial effusion. Injectable port terminates within the right atrium. Calcific atherosclerotic disease of the aorta and coronary arteries, mild. Mediastinum/Nodes: No enlarged mediastinal, hilar, or axillary lymph nodes. Thyroid gland, trachea, and esophagus demonstrate no significant findings. Lungs/Pleura: Enlarged water density right pleural effusion. Right middle lobe airspace consolidation versus segmental atelectasis. Platelike atelectasis in the right lower lobe. Minimal atelectatic changes in the left lower lobe and lingula. Upper Abdomen: No acute abnormality. Musculoskeletal: No chest wall abnormality. No acute or significant osseous findings. Review of the MIP images confirms the above findings. IMPRESSION: Airspace consolidation versus right medial segmental atelectasis of the right middle lobe. Interval increase in the size of right pleural effusion. Platelike areas of atelectasis in the bilateral lower lobes and lingula. Electronically Signed   By: Fidela Salisbury M.D.   On: 11/08/2017 15:16    Pending Labs Unresulted Labs (From admission, onward)   Start      Ordered   11/09/17 0500  Procalcitonin  Daily,   R     11/08/17 1201   11/09/17 0500  CBC  Tomorrow morning,   R     11/08/17 1237   11/09/17 0500  Comprehensive metabolic panel  Tomorrow morning,   R     11/08/17 1237   11/08/17 5456  Basic metabolic panel  Once,   R     11/08/17 1400   11/08/17 1240  TSH  Add-on,   R     11/08/17 1239   11/08/17 1201  MRSA PCR Screening  Once,   R     11/08/17 1201   11/08/17 1200  Respiratory  Panel by PCR  (Respiratory virus panel)  Once,   R     11/08/17 1159   11/08/17 1037  Blood Culture (routine x 2)  BLOOD CULTURE X 2,   STAT     11/08/17 1036      Vitals/Pain Today's Vitals   11/08/17 1612 11/08/17 1615 11/08/17 1630 11/08/17 1645  BP: (!) 125/58 134/64 (!) 126/56 (!) 125/57  Pulse: 80 85 82 77  Resp: _0 Temp:      TempSrc:      SpO2: 99% 100% 99% 98%    Isolation Precautions Droplet precaution  Medications Medications  ondansetron (ZOFRAN) injection 4 mg (not administered)  levalbuterol (XOPENEX) nebulizer solution 0.63 mg (not administered)  ceFEPIme (MAXIPIME) 2 g in dextrose 5 % 50 mL IVPB (not administered)  vancomycin (VANCOCIN) IVPB 750 mg/150 ml premix (not administered)  diltiazem (CARDIZEM) tablet 30 mg (30 mg Oral Given 11/08/17 1654)  sodium chloride 0.9 % bolus 1,000 mL (0 mLs Intravenous Stopped 11/08/17 1226)  ceFEPIme (MAXIPIME) 2 g in dextrose 5 % 50 mL IVPB (0 g Intravenous Stopped 11/08/17 1214)  vancomycin (VANCOCIN) IVPB 1000 mg/200 mL premix (0 mg Intravenous Stopped 11/08/17 1336)  potassium chloride SA (K-DUR,KLOR-CON) CR tablet 40 mEq (40 mEq Oral Given 11/08/17 1121)  sodium chloride 0.9 % bolus 500 mL (0 mLs Intravenous Stopped 11/08/17 1336)  potassium chloride SA (K-DUR,KLOR-CON) CR tablet 40 mEq (40 mEq Oral Given 11/08/17 1655)  magnesium sulfate IVPB 2 g 50 mL (0 g Intravenous Stopped 11/08/17 1641)  sodium chloride 0.9 % bolus 500 mL (0 mLs Intravenous Stopped 11/08/17 1641)  iopamidol (ISOVUE-370)  76 % injection (100 mLs Intravenous Contrast Given 11/08/17 1449)    Mobility walks

## 2017-11-08 NOTE — Telephone Encounter (Signed)
11/08/17  daughter called to cx said she got to her moms house and her heart rate is in the 130s so she is taking her to the ER

## 2017-11-08 NOTE — Progress Notes (Signed)
Pharmacy Antibiotic Note  [name] is a 84 y.o. female with ovarian cancer currently undergoing chemotherapy treatment and hx afib on xarelto PTA, presented to the ED on 11/08/2017 with c/o tachycardia, cough and congestion.  To start vancomycin and cefepime for suspected PNA.  - scr 0.64 (crcl~53, rounded scr to 0.8 for age) - ANC 20.1  Plan: - cefepime 2gm IV q12h - vancomycin 1000 mg IV x1 given in ED, then  750 mg IV q24h for est AUC 433 (goal 400-500) - monitor renal function closely  ________________________________     Temp (24hrs), Avg:97.9 F (36.6 C), Min:97.9 F (36.6 C), Max:97.9 F (36.6 C)  Recent Labs  Lab 11/04/17 0853 11/08/17 1005 11/08/17 1121  WBC 5.2 21.4*  --   CREATININE 0.83 0.64  --   LATICACIDVEN  --   --  1.94*    Estimated Creatinine Clearance: 53.5 mL/min (by C-G formula based on SCr of 0.64 mg/dL).    Allergies  Allergen Reactions  . Codeine Itching    Thank you for allowing pharmacy to be a part of this patient's care.  Lynelle Doctor 11/08/2017 12:28 PM

## 2017-11-08 NOTE — ED Notes (Signed)
With triage pt initial HR noted to be is 70s. While attaching EKG leads HR increases to 160s.

## 2017-11-08 NOTE — ED Provider Notes (Signed)
[facility] DEPT Provider Note   CSN: [id number] Arrival date & time: 11/08/17  3790     History   Chief Complaint Chief Complaint  Patient presents with  . Irregular Heart Beat    HPI [name] is a 84 y.o. female.  HPI  84 year old female with a history of ovarian cancer currently on chemotherapy and paroxysmal atrial fibrillation presents and recurrent A. fib.  She states she felt herself going to A. fib around 730 this morning.  She has been feeling palpitations and fluttering.  She denies dizziness, weakness, chest pain or pressure.  She states that she has chronic shortness of breath and has as needed oxygen but does not feel more short of breath than typical.  She has been having a cough and chest congestion for about 2 days.  Her last chemotherapy was 4 days ago.  She denies any fevers, rhinorrhea, nasal congestion.  She is on Xarelto and Cardizem and has been compliant with these, most recently taking them last night.  Past Medical History:  Diagnosis Date  . COPD (chronic obstructive pulmonary disease) ([place])   . Depression   . Diverticulosis   . Family hx of colon cancer   . Fibromyalgia   . GERD (gastroesophageal reflux disease)   . Hemorrhoids   . Hiatal hernia   . History of shingles   . Hypothyroidism   . IBS (irritable bowel syndrome)   . Lymphocytic colitis   . Osteoporosis   . Ovarian cancer ([place])   . Paroxysmal atrial fibrillation (H[facility])   . Psoriatic arthritis ([place])   . Rectal polyp   . Schatzki's ring   . Uterine polyp     Patient Active Problem List   Diagnosis Date Noted  . Pleural effusion on right 09/20/2017  . Acute on chronic diastolic heart failure ([place]) 08/19/2017  . Pancytopenia, acquired ([place]) 08/15/2017  . Protein-calorie malnutrition, moderate ([place]) 07/30/2017  . Generalized weakness 07/30/2017  . Antineoplastic chemotherapy induced pancytopenia ([place]) 07/20/2017  . Ovarian CA, right ([place])  07/18/2017  . PNA (pneumonia) 07/07/2017  . Ascites 06/30/2017  . Ascites, malignant 06/30/2017  . Right ovarian cyst 01/18/2017  . Elevated CA-125 01/18/2017  . Depression 12/23/2015  . Peripheral edema 08/23/2015  . Fracture of hip, left, closed ([place]) 12/08/2013  . Hip fracture ([place]) 12/08/2013  . PAD (peripheral artery disease) ([place]) 11/25/2013  . Encounter for therapeutic drug monitoring 11/09/2013  . Atrial fibrillation ([place]) 05/07/2013  . Cough 03/20/2013  . Total knee replacement status 10/07/2012  . Knee pain 10/07/2012  . Knee stiffness 10/07/2012  . Tachycardia 09/17/2012  . Chest pain 09/17/2012  . Difficulty in walking(719.7) 09/16/2012  . Muscle weakness (generalized) 09/16/2012  . Postop Acute blood loss anemia 08/08/2012  . Instability of prosthetic knee ([place]) 08/06/2012  . Bloating 02/14/2012  . Upper abdominal pain 02/14/2012  . Allergic rhinitis, seasonal 02/15/2011  . COLITIS 02/20/2010  . Diarrhea 01/02/2010  . ABDOMINAL PAIN -GENERALIZED 01/02/2010  . PERSONAL HX COLONIC POLYPS 01/02/2010  . HYPOTHYROIDISM 12/28/2009  . COPD (chronic obstructive pulmonary disease) ([place]) 12/28/2009  . ARTHRITIS 12/28/2009    Past Surgical History:  Procedure Laterality Date  . CHOLECYSTECTOMY  Dec 04, 2010  . COLONOSCOPY    . FEMUR IM NAIL Left 12/11/2013   Procedure: INTRAMEDULLARY (IM) NAIL FEMORAL;  Surgeon: [name], MD;  Location: [facility] ORS;  Service: Orthopedics;  Laterality: Left;  . FRACTURE SURGERY Left 12/11/2013   hip  . IR  FLUORO GUIDE PORT INSERTION RIGHT  07/22/2017  . IR PARACENTESIS  07/12/2017  . IR US GUIDE VASC ACCESS RIGHT  07/22/2017  . KNEE SURGERY Left    x 2  . TOTAL KNEE REVISION  08/06/2012   Procedure: TOTAL KNEE REVISION;  Surgeon: [name], MD;  Location: [facility];  Service: Orthopedics;  Laterality: Left;  Left Total Knee Arthroplasty Revision  . Uterine polypectomy      OB History    No data available       Home  Medications    Prior to Admission medications   Medication Sig Start Date End Date Taking? Authorizing Provider  albuterol (PROAIR HFA) 108 (90 Base) MCG/ACT inhaler Inhale 2 puffs into the lungs every 6 (six) hours as needed for wheezing or shortness of breath. 06/19/17   Collene Gobble, MD  ALPRAZolam Duanne Moron) 0.25 MG tablet Take 1 tablet (0.25 mg total) by mouth at bedtime as needed for anxiety. 08/27/17   Heath Lark, MD  azelastine (ASTELIN) 0.1 % nasal spray Place 2 sprays into both nostrils 2 (two) times daily. Use in each nostril as directed    [provider]  budesonide-formoterol (SYMBICORT) 160-4.5 MCG/ACT inhaler Inhale 2 puffs into the lungs 2 (two) times daily. 07/02/17   Collene Gobble, MD  Cholecalciferol (VITAMIN D) 2000 units CAPS Take 2,000 Units by mouth daily.    [provider]  diltiazem (CARTIA XT) 120 MG 24 hr capsule TAKE 1 CAPSULE BY MOUTH EVERY DAY 05/03/17   Satira Sark, MD  diphenoxylate-atropine (LOMOTIL) 2.5-0.025 MG tablet Take 1 tablet by mouth 4 (four) times daily. Patient taking differently: Take 1 tablet by mouth 4 (four) times daily as needed for diarrhea or loose stools.  08/21/16   [name], MD  fluticasone [facility]) 50 MCG/ACT nasal spray Place 2 sprays into both nostrils 2 (two) times daily. For nasal congestion 11/20/13   [provider]  furosemide (LASIX) 20 MG tablet Take 1 tablet (20 mg total) by mouth daily. 11/04/17   Heath Lark, MD  levothyroxine (SYNTHROID, LEVOTHROID) 137 MCG tablet Take 137 mcg by mouth every morning. For thyroid therapy    [provider]  lidocaine-prilocaine (EMLA) cream Apply 1 application topically as needed. 07/30/17   Heath Lark, MD  loperamide (IMODIUM) 2 MG capsule Take 2 capsules (4 mg total) by mouth 3 (three) times daily. 07/24/17   Heath Lark, MD  magic mouthwash w/lidocaine SOLN Take 5 mLs by mouth 4 (four) times daily. 5 ml QID swish and spit 10/17/17   Heath Lark, MD    methylcellulose (ARTIFICIAL TEARS) 1 % ophthalmic solution Place 1 drop into both eyes 2 (two) times daily as needed. Dry eyes    [provider]  mirtazapine (REMERON) 15 MG tablet Take 1 tablet (15 mg total) by mouth at bedtime. 11/04/17   Heath Lark, MD  ondansetron (ZOFRAN) 8 MG tablet Take 1 tablet (8 mg total) by mouth every 8 (eight) hours as needed for nausea or vomiting. 10/14/17   Heath Lark, MD  oxyCODONE 10 MG TABS Take 1 tablet (10 mg total) by mouth every 4 (four) hours as needed for severe pain. 11/04/17   Heath Lark, MD  prochlorperazine (COMPAZINE) 10 MG tablet Take 10 mg by mouth every 6 (six) hours as needed for nausea. 07/05/17   [provider]  senna-docusate (SENOKOT-S) 8.6-50 MG tablet Take 1 tablet by mouth at bedtime. 07/24/17   Heath Lark, MD  Alveda Reasons  20 MG TABS tablet TAKE 1 TABLET BY MOUTH EVERY DAY WITH SUPPER 04/23/17   Satira Sark, MD  Calcium Carbonate (CALCIUM 600) 1500 MG TABS Take 2 tablets by mouth daily.    12/18/11  [provider]    Family History Family History  Problem Relation Age of Onset  . Heart disease Father        pacemaker  . Lung cancer Father        hx of smoker  . Colon cancer Mother        dx in her mid 1's  . Colon cancer Maternal Grandfather   . Depression Sister     Social History Social History   Tobacco Use  . Smoking status: Former Smoker    Packs/day: 1.00    Years: 20.00    Pack years: 20.00    Types: Cigarettes    Last attempt to quit: 10/08/1980    Years since quitting: 37.1  . Smokeless tobacco: Never Used  Substance Use Topics  . Alcohol use: Yes    Comment: rarely, 12-23-15 rarely  . Drug use: No     Allergies   Codeine   Review of Systems Review of Systems  Constitutional: Negative for fever.  HENT: Positive for congestion. Negative for rhinorrhea.   Respiratory: Positive for cough and shortness of breath.   Cardiovascular: Negative for chest pain.  Gastrointestinal:  Negative for nausea and vomiting.  All other systems reviewed and are negative.    Physical Exam Updated Vital Signs BP 102/69 (BP Location: Right Arm)   Pulse (!) 169   Temp 97.9 F (36.6 C) (Oral)   Resp 16   SpO2 98%   Physical Exam  Constitutional: She is oriented to person, place, and time. She appears well-developed and well-nourished. No distress.  HENT:  Head: Normocephalic and atraumatic.  Right Ear: External ear normal.  Left Ear: External ear normal.  Nose: Nose normal.  Eyes: Right eye exhibits no discharge. Left eye exhibits no discharge.  Neck: Neck supple.  Cardiovascular: Normal heart sounds. An irregular rhythm present. Tachycardia present.  Pulses:      Radial pulses are 2+ on the right side, and 2+ on the left side.  Pulmonary/Chest: Effort normal. No accessory muscle usage. No tachypnea. She has decreased breath sounds in the right lower field.  Abdominal: Soft. There is no tenderness.  Neurological: She is alert and oriented to person, place, and time.  Skin: Skin is warm and dry. She is not diaphoretic.  Nursing note and vitals reviewed.    ED Treatments / Results  Labs (all labs ordered are listed, but only abnormal results are displayed) Labs Reviewed  CBC WITH DIFFERENTIAL/PLATELET - Abnormal; Notable for the following components:      Result Value   WBC 21.4 (*)    RBC 3.38 (*)    Hemoglobin 11.0 (*)    HCT 32.7 (*)    Platelets 121 (*)    Neutro Abs 20.1 (*)    Monocytes Absolute 0.0 (*)    All other components within normal limits  CULTURE, BLOOD (ROUTINE X 2)  CULTURE, BLOOD (ROUTINE X 2)  BASIC METABOLIC PANEL  I-STAT CG4 LACTIC ACID, ED    EKG  EKG Interpretation  Date/Time:  Friday November 08 2017 09:34:41 EST Ventricular Rate:  169 PR Interval:    QRS Duration: 90 QT Interval:  287 QTC Calculation: 482 R Axis:   78 Text Interpretation:  Atrial fibrillation with rapid V-rate Low  voltage, extremity and precordial leads ST  depression, probably rate related Confirmed by Sherwood Gambler [phone]) on 11/08/2017 9:47:00 AM       EKG Interpretation  Date/Time:  Friday November 08 2017 10:38:00 EST Ventricular Rate:  110 PR Interval:    QRS Duration: 86 QT Interval:  319 QTC Calculation: 432 R Axis:   138 Text Interpretation:  Right and left arm electrode reversal, interpretation assumes no reversal Sinus tachycardia Probable left atrial enlargement Right axis deviation Low voltage, precordial leads afib resolved, now sinus tachycardia ST depression improved Confirmed by Sherwood Gambler [phone]) on 11/08/2017 10:51:37 AM Also confirmed by Sherwood Gambler [phone]), editor Lynder Parents [phone])  on 11/08/2017 11:02:15 AM        Radiology Dg Chest 2 View  Result Date: 11/08/2017 CLINICAL DATA:  Tachycardia. EXAM: CHEST  2 VIEW COMPARISON:  09/24/2017. FINDINGS: PowerPort catheter noted with tip over the right atrium. Heart size normal. Atelectatic changes right mid and lower lung. Possible right base infiltrate. Follow-up exam suggested demonstrate clearing. Small right pleural effusion. No pneumothorax. Diffuse thoracic spine osteopenia degenerative change. IMPRESSION: 1.  PowerPort catheter noted with tip over the right atrium. 2. Right mid lung and right base atelectatic changes. Right base infiltrate cannot be excluded. Small right pleural effusion. Follow-up chest x-rays to demonstrate clearing suggested. Electronically Signed   By: Marcello Moores  Register   On: 11/08/2017 10:29    Procedures .Critical Care Performed by: Sherwood Gambler, MD Authorized by: Sherwood Gambler, MD   Critical care provider statement:    Critical care time (minutes):  35   Critical care time was exclusive of:  Separately billable procedures and treating other patients   Critical care was necessary to treat or prevent imminent or life-threatening deterioration of the following conditions:  Respiratory failure, sepsis, circulatory failure and  cardiac failure   Critical care was time spent personally by me on the following activities:  Development of treatment plan with patient or surrogate, discussions with consultants, evaluation of patient's response to treatment, examination of patient, obtaining history from patient or surrogate, ordering and performing treatments and interventions, ordering and review of laboratory studies, ordering and review of radiographic studies, pulse oximetry, re-evaluation of patient's condition and review of old charts   (including critical care time)  Medications Ordered in ED Medications  vancomycin (VANCOCIN) IVPB 1000 mg/200 mL premix (not administered)  potassium chloride 10 mEq in 100 mL IVPB (not administered)  sodium chloride 0.9 % bolus 500 mL (not administered)  sodium chloride 0.9 % bolus 1,000 mL (1,000 mLs Intravenous New Bag/Given 11/08/17 1130)  ceFEPIme (MAXIPIME) 2 g in dextrose 5 % 50 mL IVPB (2 g Intravenous New Bag/Given 11/08/17 1123)  potassium chloride SA (K-DUR,KLOR-CON) CR tablet 40 mEq (40 mEq Oral Given 11/08/17 1121)     Initial Impression / Assessment and Plan / ED Course  I have reviewed the triage vital signs and the nursing notes.  Pertinent labs & imaging results that were available during my care of the patient were reviewed by me and considered in my medical decision making (see chart for details).     Patient presents with symptomatic atrial fibrillation with RVR.  This resolved prior to treatment with diltiazem.  There are multiple possible causes of her paroxysmal A. fib including mild to moderate hypokalemia of 3.0, and what appears to be pneumonia.  Given her cough and congestion with worsening pleural effusion and likely infiltrate, she will be treated with broad antibiotics given her immunocompromise from chemotherapy  and cancer.  She is not in distress.  She has had a couple soft blood pressures in the 90s but no hypotension.  Her lactic acid is not consistent with  septic shock.  She will be admitted to the hospitalist service for further treatment and care. Dr Allyson Sabal to admit  Final Clinical Impressions(s) / ED Diagnoses   Final diagnoses:  HCAP (healthcare-associated pneumonia)  Pleural effusion on right  Paroxysmal atrial fibrillation with RVR ([place])  Hypokalemia    ED Discharge Orders    None       Sherwood Gambler, MD 11/08/17 1210

## 2017-11-08 NOTE — Telephone Encounter (Signed)
11/08/17  daughter left a message that her mom was being admitted into the hospital

## 2017-11-08 NOTE — ED Triage Notes (Signed)
Pt verbalizes having racing heart this morning; hx of a fib. Pt verbalizes feels better now though. Family member verbalizes noting pt having cough/rattling the past few days. Pt receives chemo for ovarian cancer; last treatment Monday.

## 2017-11-08 NOTE — Progress Notes (Signed)
A consult was received from an ED physician for Vanc & Cefepime per pharmacy dosing.  The patient's profile has been reviewed for ht/wt/allergies/indication/available labs.   A one time order has been placed for Vanc 1gm & Cefepime 2gm.  Further antibiotics/pharmacy consults should be ordered by admitting physician if indicated.                       Thank you, Biagio Borg 11/08/2017  10:52 AM

## 2017-11-08 NOTE — ED Notes (Signed)
Bed: WA18 Expected date:  Expected time:  Means of arrival:  Comments: triage 

## 2017-11-09 ENCOUNTER — Other Ambulatory Visit: Payer: Self-pay

## 2017-11-09 DIAGNOSIS — I4891 Unspecified atrial fibrillation: Secondary | ICD-10-CM

## 2017-11-09 DIAGNOSIS — I480 Paroxysmal atrial fibrillation: Secondary | ICD-10-CM

## 2017-11-09 DIAGNOSIS — E876 Hypokalemia: Secondary | ICD-10-CM

## 2017-11-09 LAB — RESPIRATORY PANEL BY PCR
Adenovirus: NOT DETECTED
BORDETELLA PERTUSSIS-RVPCR: NOT DETECTED
CHLAMYDOPHILA PNEUMONIAE-RVPPCR: NOT DETECTED
CORONAVIRUS 229E-RVPPCR: NOT DETECTED
CORONAVIRUS HKU1-RVPPCR: NOT DETECTED
Coronavirus NL63: NOT DETECTED
Coronavirus OC43: DETECTED — AB
Influenza A: NOT DETECTED
Influenza B: NOT DETECTED
Metapneumovirus: NOT DETECTED
Mycoplasma pneumoniae: NOT DETECTED
Parainfluenza Virus 1: NOT DETECTED
Parainfluenza Virus 2: NOT DETECTED
Parainfluenza Virus 3: NOT DETECTED
Parainfluenza Virus 4: NOT DETECTED
Respiratory Syncytial Virus: NOT DETECTED
Rhinovirus / Enterovirus: NOT DETECTED

## 2017-11-09 LAB — MRSA PCR SCREENING: MRSA BY PCR: NEGATIVE

## 2017-11-09 LAB — COMPREHENSIVE METABOLIC PANEL
ALBUMIN: 3 g/dL — AB (ref 3.5–5.0)
ALK PHOS: 50 U/L (ref 38–126)
ALT: 19 U/L (ref 14–54)
ANION GAP: 6 (ref 5–15)
AST: 18 U/L (ref 15–41)
BUN: 14 mg/dL (ref 6–20)
CALCIUM: 8.4 mg/dL — AB (ref 8.9–10.3)
CO2: 29 mmol/L (ref 22–32)
CREATININE: 0.48 mg/dL (ref 0.44–1.00)
Chloride: 101 mmol/L (ref 101–111)
GFR calc Af Amer: >60 mL/min (ref >60)
GFR calc non Af Amer: >60 mL/min (ref >60)
Glucose, Bld: 108 mg/dL — ABNORMAL HIGH (ref 65–99)
Potassium: 3.9 mmol/L (ref 3.5–5.1)
SODIUM: 136 mmol/L (ref 135–145)
Total Bilirubin: 0.7 mg/dL (ref 0.3–1.2)
Total Protein: 5.8 g/dL — ABNORMAL LOW (ref 6.5–8.1)

## 2017-11-09 LAB — CBC
HCT: 28.1 % — ABNORMAL LOW (ref 36.0–46.0)
HEMOGLOBIN: 9.7 g/dL — AB (ref 12.0–15.0)
MCH: 33.4 pg (ref 26.0–34.0)
MCHC: 34.5 g/dL (ref 30.0–36.0)
MCV: 96.9 fL (ref 78.0–100.0)
Platelets: 91 10*3/uL — ABNORMAL LOW (ref 150–400)
RBC: 2.9 MIL/uL — AB (ref 3.87–5.11)
RDW: 14.6 % (ref 11.5–15.5)
WBC: 9.2 10*3/uL (ref 4.0–10.5)

## 2017-11-09 LAB — PROCALCITONIN: PROCALCITONIN: 0.16 ng/mL

## 2017-11-09 MED ORDER — SODIUM CHLORIDE 0.9% FLUSH
10.0000 mL | Freq: Two times a day (BID) | INTRAVENOUS | Status: DC
  Administered 2017-11-09 – 2017-11-14 (×7): 10 mL

## 2017-11-09 MED ORDER — SODIUM CHLORIDE 0.9% FLUSH
10.0000 mL | INTRAVENOUS | Status: DC | PRN
  Administered 2017-11-15: 10 mL
  Filled 2017-11-09: qty 40

## 2017-11-09 MED ORDER — METHYLPREDNISOLONE SODIUM SUCC 40 MG IJ SOLR
40.0000 mg | Freq: Two times a day (BID) | INTRAMUSCULAR | Status: AC
  Administered 2017-11-09 – 2017-11-11 (×4): 40 mg via INTRAVENOUS
  Filled 2017-11-09 (×4): qty 1

## 2017-11-09 MED ORDER — IPRATROPIUM-ALBUTEROL 0.5-2.5 (3) MG/3ML IN SOLN
3.0000 mL | Freq: Three times a day (TID) | RESPIRATORY_TRACT | Status: DC
  Administered 2017-11-10: 3 mL via RESPIRATORY_TRACT
  Filled 2017-11-09: qty 3

## 2017-11-09 NOTE — Progress Notes (Signed)
[facility] Progress Note  Subjective: no new c/o  Vitals:   11/09/17 0745 11/09/17 1134 11/09/17 1418 11/09/17 1512  BP:   (!) 116/48   Pulse:   81   Resp:      Temp:   98.5 F (36.9 C)   TempSrc:   Oral   SpO2: 98% 98% 99% 97%  Weight:      Height:        Inpatient medications: . diltiazem  30 mg Oral Q8H  . feeding supplement (ENSURE ENLIVE)  237 mL Oral BID BM  . fluticasone  2 spray Each Nare BID  . ipratropium-albuterol  3 mL Nebulization QID  . levothyroxine  137 mcg Oral QAC breakfast  . mirtazapine  15 mg Oral QHS  . mometasone-formoterol  2 puff Inhalation BID  . rivaroxaban  20 mg Oral Q supper  . sodium chloride flush  10-40 mL Intracatheter Q12H   . ceFEPime (MAXIPIME) IV 2 g (11/09/17 1405)  . vancomycin 750 mg (11/09/17 1035)   albuterol, ALPRAZolam, ondansetron (ZOFRAN) IV, oxyCODONE, sodium chloride flush  Exam: Elderly WF, pleasant, no distress No jvd Chest occ scattered crackles R base, L clear  RRR no mrg Abd obese , soft ntnd Ext no edema, no wounds NF, Ox 3   Brief Summary: 84 year old female with a history of ovarian ca , status post chemotherapy on Monday this week, presents today with chief complaint of heart racing, she has a known history of A. fib and is on anticoagulation with Xarelto. Patient felt her heart racing. When questioned she states that she has had pretty much every known side effect from her chemotherapy.. She's been dizzy, lightheaded, weak, she's had some diarrhea this week. Her dyspnea is at its baseline. She has had a nonproductive cough. She denies any pleuritic chest pain. She's  denies any fevers, chills.  In ED HR was 169, BP 102/69.  WBC 21k, EKG w/ afib RVR, CXR RML infiltrate and worsening pleural effusion . CT chest RML pna and large R effusion.  Pt given IV vanc/ cefipime and admitted for PNA w pleural effusion, afib / RVR and hx ovarian cancer on active chemoRx.      Active Problems:  Acute on chronic  COPD exac / RML PNA/ R pleural effusion: - getting IV abx for PNA, wbc coming down - nebs, O2 for COPD exac, and IV steroids low dose - cont dulera - CT chest showed inc'd R pleural effusion, no sign of mass or loculation - does not appear septic - consider thoracentesis   Atrial fibrillation with rapid ventricular response: prob due to acute illness/ pna - pt responded to IV dilt , converted to NSR and was changed to po diltiazem 30 tid; at home was taking cartia XT 120 mg / day. Cont xarelto. Got 2 gm IV MgSO4 for low Mg 1.5.  K+ 3.9, got some IV KCl as well.    Ovarian cancer: - pt receiving active chemoRx, last was 1/28   Depression: - denies suicidal ideation or plan continue Remeron  Hypothyroidism: cont synthroid 137 ug, check tsh  Depression/ anxiety: - cont remeron, xanax prn  COPD: - chronic issue , cont dulera, nebs prn  Code Status: FC DVT Prophylaxis: on xarelto for afib Family Communication: none here Disposition Plan: not sure yet  Kelly Splinter MD [facility] pgr [phone] 11/09/2017, 4:17 PM     Status: inpatient telemetry  Treatments:              -  IV diltiazem -Vanc 2/1 >> -Cefepime 2/1 >>   Procedures: -none  Consults: -none     Recent Labs  Lab 11/08/17 1005 11/08/17 1649 11/09/17 0722  NA 138 138 136  K 3.0* 3.9 3.9  CL 97* 104 101  CO2 32 28 29  GLUCOSE 132* 138* 108*  BUN 22* 18 14  CREATININE 0.64 0.43* 0.48  CALCIUM 8.8* 8.1* 8.4*   Recent Labs  Lab 11/04/17 0853 11/09/17 0722  AST 11 18  ALT 9 19  ALKPHOS 61 50  BILITOT 0.3 0.7  PROT 7.0 5.8*  ALBUMIN 3.3* 3.0*   Recent Labs  Lab 11/04/17 0853 11/08/17 1005 11/09/17 0722  WBC 5.2 21.4* 9.2  NEUTROABS 4.7 20.1*  --   HGB 11.0* 11.0* 9.7*  HCT 32.3* 32.7* 28.1*  MCV 95.0 96.7 96.9  PLT 101* 121* 91*   Iron/TIBC/Ferritin/ %Sat No results found for: IRON, TIBC, FERRITIN, IRONPCTSAT

## 2017-11-10 DIAGNOSIS — J189 Pneumonia, unspecified organism: Secondary | ICD-10-CM

## 2017-11-10 LAB — BASIC METABOLIC PANEL
ANION GAP: 7 (ref 5–15)
BUN: 18 mg/dL (ref 6–20)
CO2: 29 mmol/L (ref 22–32)
Calcium: 8.8 mg/dL — ABNORMAL LOW (ref 8.9–10.3)
Chloride: 98 mmol/L — ABNORMAL LOW (ref 101–111)
Creatinine, Ser: 0.69 mg/dL (ref 0.44–1.00)
GFR calc Af Amer: >60 mL/min (ref >60)
Glucose, Bld: 157 mg/dL — ABNORMAL HIGH (ref 65–99)
POTASSIUM: 5.1 mmol/L (ref 3.5–5.1)
SODIUM: 134 mmol/L — AB (ref 135–145)

## 2017-11-10 LAB — CBC
HCT: 29.2 % — ABNORMAL LOW (ref 36.0–46.0)
Hemoglobin: 9.8 g/dL — ABNORMAL LOW (ref 12.0–15.0)
MCH: 32.7 pg (ref 26.0–34.0)
MCHC: 33.6 g/dL (ref 30.0–36.0)
MCV: 97.3 fL (ref 78.0–100.0)
PLATELETS: 93 10*3/uL — AB (ref 150–400)
RBC: 3 MIL/uL — AB (ref 3.87–5.11)
RDW: 14.3 % (ref 11.5–15.5)
WBC: 3.8 10*3/uL — AB (ref 4.0–10.5)

## 2017-11-10 LAB — TSH: TSH: 0.388 u[IU]/mL (ref 0.350–4.500)

## 2017-11-10 MED ORDER — IPRATROPIUM-ALBUTEROL 0.5-2.5 (3) MG/3ML IN SOLN
3.0000 mL | Freq: Two times a day (BID) | RESPIRATORY_TRACT | Status: DC
  Administered 2017-11-10 – 2017-11-11 (×2): 3 mL via RESPIRATORY_TRACT
  Filled 2017-11-10 (×2): qty 3

## 2017-11-10 MED ORDER — OXYCODONE HCL 5 MG PO TABS
5.0000 mg | ORAL_TABLET | ORAL | Status: DC | PRN
  Administered 2017-11-10: 10 mg via ORAL
  Administered 2017-11-10 – 2017-11-11 (×3): 5 mg via ORAL
  Administered 2017-11-12 – 2017-11-15 (×5): 10 mg via ORAL
  Filled 2017-11-10 (×3): qty 2
  Filled 2017-11-10 (×3): qty 1
  Filled 2017-11-10: qty 2
  Filled 2017-11-10: qty 1
  Filled 2017-11-10: qty 2
  Filled 2017-11-10: qty 1

## 2017-11-10 NOTE — Progress Notes (Signed)
Nutrition Brief Note  Patient identified on the Malnutrition Screening Tool (MST) Report  Patient is eating 100% of meals and denies any issues with appetite. Pt drank 1 Ensure supplement yesterday. Will continue supplement as desired.  Wt Readings from Last 3 Encounters:  11/08/17 164 lb 14.5 oz (74.8 kg)  11/04/17 163 lb 9.6 oz (74.2 kg)  10/14/17 161 lb 6.4 oz (73.2 kg)    Body mass index is 28.31 kg/m. Patient meets criteria for overweight based on current BMI.   Current diet order is Heart Healthy, patient is consuming approximately 100% of meals at this time. Labs and medications reviewed.   No nutrition interventions warranted at this time. If nutrition issues arise, please consult RD.   Clayton Bibles, MS, RD, [place] Dietitian Pager: [phone] After Hours Pager: [phone]

## 2017-11-10 NOTE — Progress Notes (Signed)
Patient ID: [name], female   DOB: Apr 17, 1933, 84 y.o.   MRN: [id number]  PROGRESS NOTE    [name]  NWG:[id number] DOB: [birth year] DOA: 11/08/2017   PCP: Asencion Noble, MD   Outpatient Specialists: [facility]  Brief Narrative:  84 year old female with a history ofovarian ca, status post chemotherapy on Monday this week, presents today with chief complaint of heart racing, she has a known history of A. fib and is on anticoagulation with Xarelto. Patient felt her heart racing. When questioned she states that she has had pretty much every known side effect from her chemotherapy.. She's been dizzy, lightheaded, weak, she's had some diarrhea this week. Her dyspnea is at its baseline. She has had a nonproductive cough. She denies any pleuritic chest pain. She's denies any fevers, chills.  In ED HR was 169, BP 102/69.  WBC 21k, EKG w/ afib RVR, CXR RML infiltrate and worsening pleural effusion . CT chest RML pna and large R effusion.  Pt given IV vanc/ cefipime and admitted for PNA w pleural effusion, afib / RVR and hx ovarian cancer on active chemoRx   Assessment & Plan:   Principal Problem:   PNA (pneumonia) Active Problems:   COPD (chronic obstructive pulmonary disease) (HCC)   Atrial fibrillation (HCC)   Atrial fibrillation with RVR ([place])   HCAP (healthcare-associated pneumonia)  Acute on chronic COPD exac / RML PNA/ R pleural effusion: -Patient still requiring more than 2 L of oxygen with activity. Her baseline is 2 L at home. - getting IV abx for PNA, wbc coming down - nebs, O2 for COPD exac, and IV steroids low dose - cont dulera - CT chest showed inc'd R pleural effusion, no sign of mass or loculation - Patient wants her pulmonologist consulted in the morning - Will order thoracentesis  by IR but patient on xarelto. We will hold.  Atrial fibrillation with rapid ventricular response: prob due to acute illness/ pna - pt responded to IV dilt ,  converted to NSR and was changed to po diltiazem 30 tid; at home was taking cartia XT 120 mg / day. Cont xarelto. Got 2 gm IV MgSO4 for low Mg 1.5.  K+ 3.9, got some IV KCl as well.  -She remains in sinus rhythm at the moment.    Ovarian cancer: - pt receiving active chemoRx, last was 1/28  - Follow-up with oncologist. Possibility of ovarian cancer with corresponding pleural effusion  likely in this patient. Pathology will be sent for Mullerian cells  Depression: - denies suicidal ideation or plan continue Remeron  Hypothyroidism: cont synthroid 137 ug,   Depression/ anxiety: - cont remeron, xanax prn  COPD: - chronic issue , cont dulera, nebs prn    DVT prophylaxis: xarelto, SCD while on hold  Code Status: Full  Family Communication: Daughter and sister  Disposition Plan: To be determine  Consultants:   None yet  Procedures:    CT chest showing significant pleural effusion  Antimicrobials:  -Vanc 2/1 >> -Cefepime 2/1 >>        Subjective: Patient is still having significant shortness of breath and cough. No fever or chills.  Objective: Vitals:   11/10/17 0500 11/10/17 0842 11/10/17 0844 11/10/17 1325  BP: (!) 148/67   (!) 143/52  Pulse: 81   100  Resp: 17     Temp: 97.6 F (36.4 C)   98.6 F (37 C)  TempSrc: Oral   Oral  SpO2: 100% 98% 98% 98%  Weight:      Height:        Intake/Output Summary (Last 24 hours) at 11/10/2017 1637 Last data filed at 11/09/2017 2253 Gross per 24 hour  Intake 150 ml  Output -  Net 150 ml   Filed Weights   11/08/17 2254  Weight: 74.8 kg (164 lb 14.5 oz)    Examination:  General exam: Appears calm and comfortable  Respiratory system: Clear to auscultation. Respiratory effort normal. Cardiovascular system: S1 & S2 heard, RRR. No JVD, murmurs, rubs, gallops or clicks. No pedal edema. Gastrointestinal system: Abdomen is nondistended, soft and nontender. No organomegaly or masses felt. Normal bowel sounds  heard. Central nervous system: Alert and oriented. No focal neurological deficits. Extremities: Symmetric 5 x 5 power. Skin: No rashes, lesions or ulcers Psychiatry: Judgement and insight appear normal. Mood & affect appropriate.     Data Reviewed: I have personally reviewed following labs and imaging studies  CBC: Recent Labs  Lab 11/04/17 0853 11/08/17 1005 11/09/17 0722 11/10/17 0349  WBC 5.2 21.4* 9.2 3.8*  NEUTROABS 4.7 20.1*  --   --   HGB 11.0* 11.0* 9.7* 9.8*  HCT 32.3* 32.7* 28.1* 29.2*  MCV 95.0 96.7 96.9 97.3  PLT 101* 121* 91* 93*   Basic Metabolic Panel: Recent Labs  Lab 11/04/17 0853 11/08/17 1005 11/08/17 1649 11/09/17 0722 11/10/17 0349  NA 136 138 138 136 134*  K 4.1 3.0* 3.9 3.9 5.1  CL 100 97* 104 101 98*  CO2 27 32 28 29 29   GLUCOSE 227* 132* 138* 108* 157*  BUN 11 22* 18 14 18   CREATININE 0.83 0.64 0.43* 0.48 0.69  CALCIUM 9.2 8.8* 8.1* 8.4* 8.8*  MG  --  1.5*  --   --   --    GFR: Estimated Creatinine Clearance: 53.7 mL/min (by C-G formula based on SCr of 0.69 mg/dL). Liver Function Tests: Recent Labs  Lab 11/04/17 0853 11/09/17 0722  AST 11 18  ALT 9 19  ALKPHOS 61 50  BILITOT 0.3 0.7  PROT 7.0 5.8*  ALBUMIN 3.3* 3.0*   No results for input(s): LIPASE, AMYLASE in the last 168 hours. No results for input(s): AMMONIA in the last 168 hours. Coagulation Profile: No results for input(s): INR, PROTIME in the last 168 hours. Cardiac Enzymes: No results for input(s): CKTOTAL, CKMB, CKMBINDEX, TROPONINI in the last 168 hours. BNP (last 3 results) Recent Labs    08/12/17 1137  PROBNP 837*   HbA1C: No results for input(s): HGBA1C in the last 72 hours. CBG: No results for input(s): GLUCAP in the last 168 hours. Lipid Profile: No results for input(s): CHOL, HDL, LDLCALC, TRIG, CHOLHDL, LDLDIRECT in the last 72 hours. Thyroid Function Tests: Recent Labs    11/10/17 0349  TSH 0.388   Anemia Panel: No results for input(s):  VITAMINB12, FOLATE, FERRITIN, TIBC, IRON, RETICCTPCT in the last 72 hours. Urine analysis:    Component Value Date/Time   COLORURINE YELLOW 06/30/2017 1235   APPEARANCEUR HAZY (A) 06/30/2017 1235   LABSPEC 1.030 10/11/2017 1125   PHURINE 6.0 10/11/2017 1125   PHURINE 6.0 06/30/2017 1235   GLUCOSEU Negative 10/11/2017 1125   HGBUR Small 10/11/2017 1125   HGBUR MODERATE (A) 06/30/2017 1235   BILIRUBINUR Negative 10/11/2017 1125   KETONESUR Negative 10/11/2017 1125   KETONESUR 40 (A) 06/30/2017 1235   PROTEINUR 30 10/11/2017 1125   PROTEINUR 100 (A) 06/30/2017 1235   UROBILINOGEN 0.2 10/11/2017 1125   NITRITE Negative 10/11/2017 1125  NITRITE NEGATIVE 06/30/2017 1235   LEUKOCYTESUR Negative 10/11/2017 1125   Sepsis Labs: @LABRCNTIP (procalcitonin:4,lacticidven:4)  ) Recent Results (from the past 240 hour(s))  Blood Culture (routine x 2)     Status: None (Preliminary result)   Collection Time: 11/08/17 11:15 AM  Result Value Ref Range Status   Specimen Description   Final    BLOOD LEFT ANTECUBITAL Performed at [facility] [address], [place], [place] [address]    Special Requests   Final    BOTTLES DRAWN AEROBIC AND ANAEROBIC Blood Culture adequate volume Performed at [facility] [address] [place], [place] [address]    Culture   Final    NO GROWTH 2 DAYS Performed at [facility] [address], [place], [place] [address]    Report Status PENDING  Incomplete  Blood Culture (routine x 2)     Status: None (Preliminary result)   Collection Time: 11/08/17 10:39 PM  Result Value Ref Range Status   Specimen Description   Final    BLOOD RIGHT ARM Performed at [facility] [address], [place], [place] [address]    Special Requests   Final    IN PEDIATRIC BOTTLE Blood Culture adequate volume Performed at [facility] [address] [place], [place] [address]    Culture    Final    NO GROWTH 1 DAY Performed at [facility], [place] [address], [place], [place] [address]    Report Status PENDING  Incomplete  Respiratory Panel by PCR     Status: Abnormal   Collection Time: 11/08/17 11:00 PM  Result Value Ref Range Status   Adenovirus NOT DETECTED NOT DETECTED Final   Coronavirus 229E NOT DETECTED NOT DETECTED Final   Coronavirus HKU1 NOT DETECTED NOT DETECTED Final   Coronavirus NL63 NOT DETECTED NOT DETECTED Final   Coronavirus OC43 DETECTED (A) NOT DETECTED Final   Metapneumovirus NOT DETECTED NOT DETECTED Final   Rhinovirus / Enterovirus NOT DETECTED NOT DETECTED Final   Influenza A NOT DETECTED NOT DETECTED Final   Influenza B NOT DETECTED NOT DETECTED Final   Parainfluenza Virus 1 NOT DETECTED NOT DETECTED Final   Parainfluenza Virus 2 NOT DETECTED NOT DETECTED Final   Parainfluenza Virus 3 NOT DETECTED NOT DETECTED Final   Parainfluenza Virus 4 NOT DETECTED NOT DETECTED Final   Respiratory Syncytial Virus NOT DETECTED NOT DETECTED Final   Bordetella pertussis NOT DETECTED NOT DETECTED Final   Chlamydophila pneumoniae NOT DETECTED NOT DETECTED Final   Mycoplasma pneumoniae NOT DETECTED NOT DETECTED Final    Comment: Performed at [facility], [place] [address] [place], [place] [address]  MRSA PCR Screening     Status: None   Collection Time: 11/08/17 11:00 PM  Result Value Ref Range Status   MRSA by PCR NEGATIVE NEGATIVE Final    Comment:        The GeneXpert MRSA Assay (FDA approved for NASAL specimens only), is one component of a comprehensive MRSA colonization surveillance program. It is not intended to diagnose MRSA infection nor to guide or monitor treatment for MRSA infections. Performed at [facility], [place] [address], [place], [place] [address]          Radiology Studies: No results found.      Scheduled Meds: . diltiazem  30 mg Oral Q8H  . feeding supplement (ENSURE ENLIVE)  237 mL Oral BID  BM  . fluticasone  2 spray Each Nare BID  . ipratropium-albuterol  3 mL Nebulization BID  . levothyroxine  137 mcg Oral QAC breakfast  . methylPREDNISolone (SOLU-MEDROL) injection  40 mg Intravenous Q12H  . mirtazapine  15 mg Oral QHS  . mometasone-formoterol  2 puff Inhalation BID  . rivaroxaban  20 mg Oral Q supper  . sodium chloride flush  10-40 mL Intracatheter Q12H   Continuous Infusions: . ceFEPime (MAXIPIME) IV 2 g (11/10/17 1326)  . vancomycin Stopped (11/10/17 1200)     LOS: 2 days    Time spent: 42 minutes    GARBA,LAWAL, MD [facility] Pager [phone] If 7PM-7AM, please contact night-coverage [email or web address] Password [facility] 11/10/2017, 4:37 PM

## 2017-11-11 ENCOUNTER — Encounter (HOSPITAL_COMMUNITY): Payer: Self-pay

## 2017-11-11 ENCOUNTER — Telehealth (HOSPITAL_COMMUNITY): Payer: Self-pay | Admitting: Internal Medicine

## 2017-11-11 DIAGNOSIS — R0602 Shortness of breath: Secondary | ICD-10-CM

## 2017-11-11 DIAGNOSIS — C561 Malignant neoplasm of right ovary: Secondary | ICD-10-CM

## 2017-11-11 DIAGNOSIS — J441 Chronic obstructive pulmonary disease with (acute) exacerbation: Secondary | ICD-10-CM

## 2017-11-11 DIAGNOSIS — J90 Pleural effusion, not elsewhere classified: Secondary | ICD-10-CM

## 2017-11-11 DIAGNOSIS — B9729 Other coronavirus as the cause of diseases classified elsewhere: Secondary | ICD-10-CM

## 2017-11-11 DIAGNOSIS — C786 Secondary malignant neoplasm of retroperitoneum and peritoneum: Secondary | ICD-10-CM

## 2017-11-11 DIAGNOSIS — R531 Weakness: Secondary | ICD-10-CM

## 2017-11-11 DIAGNOSIS — R634 Abnormal weight loss: Secondary | ICD-10-CM

## 2017-11-11 DIAGNOSIS — E8770 Fluid overload, unspecified: Secondary | ICD-10-CM

## 2017-11-11 DIAGNOSIS — D61818 Other pancytopenia: Secondary | ICD-10-CM

## 2017-11-11 MED ORDER — DILTIAZEM HCL ER COATED BEADS 120 MG PO CP24
120.0000 mg | ORAL_CAPSULE | Freq: Every day | ORAL | Status: DC
  Administered 2017-11-11 – 2017-11-15 (×5): 120 mg via ORAL
  Filled 2017-11-11 (×5): qty 1

## 2017-11-11 MED ORDER — HEPARIN (PORCINE) IN NACL 100-0.45 UNIT/ML-% IJ SOLN
1000.0000 [IU]/h | INTRAMUSCULAR | Status: DC
  Administered 2017-11-11: 1000 [IU]/h via INTRAVENOUS
  Filled 2017-11-11: qty 250

## 2017-11-11 MED ORDER — IPRATROPIUM-ALBUTEROL 0.5-2.5 (3) MG/3ML IN SOLN
3.0000 mL | Freq: Four times a day (QID) | RESPIRATORY_TRACT | Status: DC
  Administered 2017-11-11 – 2017-11-12 (×4): 3 mL via RESPIRATORY_TRACT
  Filled 2017-11-11 (×4): qty 3

## 2017-11-11 MED ORDER — CEFEPIME HCL 1 G IJ SOLR
1.0000 g | Freq: Three times a day (TID) | INTRAMUSCULAR | Status: DC
  Administered 2017-11-11 – 2017-11-12 (×3): 1 g via INTRAVENOUS
  Filled 2017-11-11 (×4): qty 1

## 2017-11-11 MED ORDER — FUROSEMIDE 10 MG/ML IJ SOLN
40.0000 mg | Freq: Once | INTRAMUSCULAR | Status: AC
  Administered 2017-11-11: 40 mg via INTRAVENOUS
  Filled 2017-11-11: qty 4

## 2017-11-11 MED ORDER — POLYETHYLENE GLYCOL 3350 17 G PO PACK
17.0000 g | PACK | Freq: Every day | ORAL | Status: DC
  Administered 2017-11-11 – 2017-11-15 (×5): 17 g via ORAL
  Filled 2017-11-11 (×5): qty 1

## 2017-11-11 MED ORDER — MAGNESIUM SULFATE 2 GM/50ML IV SOLN
2.0000 g | Freq: Once | INTRAVENOUS | Status: AC
  Administered 2017-11-11: 2 g via INTRAVENOUS
  Filled 2017-11-11: qty 50

## 2017-11-11 NOTE — Progress Notes (Signed)
Pharmacy Antibiotic Note  [name] is a 84 y.o. female with ovarian cancer currently undergoing chemotherapy treatment and hx afib on xarelto PTA, presented to the ED on 11/08/2017 with c/o tachycardia, cough and congestion.  Pharmacy consulted to dose vancomycin and cefepime for suspected PNA.  Today, 11/11/2017 Day #4 antibiotics - vancomycin dc'd today Afebrile WBC down 3.8 SCr 0.69, CrCl ~53 ml/min Resp virus panel (+) coronavirus  Plan: - adjust cefepime to 1g IV q8h - monitor renal function closely  ________________________________  Height: 5\' 4"  (162.6 cm) Weight: 164 lb 14.5 oz (74.8 kg) IBW/kg (Calculated) : 54.7  Temp (24hrs), Avg:98.4 F (36.9 C), Min:98 F (36.7 C), Max:98.6 F (37 C)  Recent Labs  Lab 11/08/17 1005 11/08/17 1121 11/08/17 1356 11/08/17 1649 11/09/17 0722 11/10/17 0349  WBC 21.4*  --   --   --  9.2 3.8*  CREATININE 0.64  --   --  0.43* 0.48 0.69  LATICACIDVEN  --  1.94* 0.72  --   --   --     Estimated Creatinine Clearance: 53.7 mL/min (by C-G formula based on SCr of 0.69 mg/dL).    Allergies  Allergen Reactions  . Codeine Itching   Antimicrobials this admission:  2/1 vanc>> 2/4 2/1 cefepime>>  Dose adjustments this admission:  2/4 adjust cefepime from 2g q12h to 1g q8h  Microbiology results:  2/1 BCx: ngtd 2/1 MRSA PCR: neg 2/1 RVP: +coronavirus  Thank you for allowing pharmacy to be a part of this patient's care.  Peggyann Juba, PharmD, BCPS Pager: [phone] 11/11/2017 9:55 AM

## 2017-11-11 NOTE — Consult Note (Signed)
Name: JENE HUQ MRN: [id number] DOB: June 14, 1933    ADMISSION DATE:  11/08/2017 CONSULTATION DATE:  2/4   REFERRING MD :  Auburn Bilberry  CHIEF COMPLAINT:  Pleural effusion   BRIEF PATIENT DESCRIPTION:  84 year old female followed by Dr. Lamonte Sakai for chronic obstructive pulmonary disease also has a significant history of ovarian cancer currently undergoing chemotherapy, atrial fibrillation, and somewhat chronic right pleural effusion ( first identified December 2018).  Admitted 2/1 with acute exacerbation of chronic obstructive pulmonary disease complicated by atrial fibrillation with rapid ventricular response, with some question of community-acquired pneumonia.  Right pleural effusion has increased in size.  Pulmonary asked to evaluate in regards to pleural effusion  SIGNIFICANT EVENTS    STUDIES:  CT chest 2/1: Airspace consolidation versus right medial segmental atelectasis of the right middle lobe. Interval increase in the size of right pleural effusion. Platelike areas of atelectasis in the bilateral lower lobes and lingula.  HISTORY OF PRESENT ILLNESS:   This is an 84 year old female who is followed by Dr. Lamonte Sakai in the outpatient setting for chronic obstructive pulmonary disease.She also has a significant history of ovarian cancer complicated by malignant ascites for which she required ultrasound-guided paracentesis back in October 2018.  She is f/b Dr Alvy Bimler and currently undergoing planned 6 cycles of chemotherapy.  Chemotherapy has been complicated by pancytopenia.  She is also had a significant history of a right pleural effusion this is been followed with serial imaging first identified in December 2018, and treated primarily with diuretics however review of oncology's documentation suggests concern for malignancy.  She was admitted on 2/1 with chief complaint of her heart "racing" she has a known history of atrial fibrillation.  She had denied fever, chills, or chest pain but did have  new onset of productive cough and associated shortness of breath.  CT imaging demonstrated right medial subsegmental atelectasis/airspace disease as well as increase in size of right pleural effusion.  She was admitted with a working diagnosis of community-acquired pneumonia and placed on antibiotics.  Lab work today as demonstrated respiratory viral PCR positive for coronavirus.  Pro-calcitonin has been un- remarkable.  Pulmonary has been asked to evaluate in regards to the chronic pleural effusion given the concern about her acute infection  PAST MEDICAL HISTORY :   has a past medical history of COPD (chronic obstructive pulmonary disease) ([facility]), Depression, Diverticulosis, Family hx of colon cancer, Fibromyalgia, GERD (gastroesophageal reflux disease), Hemorrhoids, Hiatal hernia, History of shingles, Hypothyroidism, IBS (irritable bowel syndrome), Lymphocytic colitis, Osteoporosis, Ovarian cancer ([place]), Paroxysmal atrial fibrillation ([place]), Psoriatic arthritis ([place]), Rectal polyp, Schatzki's ring, and Uterine polyp.  has a past surgical history that includes Knee surgery (Left); Cholecystectomy (Dec 04, 2010); Total knee revision (08/06/2012); Femur IM nail (Left, 12/11/2013); Fracture surgery (Left, 12/11/2013); Uterine polypectomy; IR Paracentesis (07/12/2017); Colonoscopy; IR US Guide Vasc Access Right (07/22/2017); and IR FLUORO GUIDE PORT INSERTION RIGHT (07/22/2017). Prior to Admission medications   Medication Sig Start Date End Date Taking? Authorizing Provider  albuterol (PROAIR HFA) 108 (90 Base) MCG/ACT inhaler Inhale 2 puffs into the lungs every 6 (six) hours as needed for wheezing or shortness of breath. 06/19/17  Yes Collene Gobble, MD  ALPRAZolam Duanne Moron) 0.25 MG tablet Take 1 tablet (0.25 mg total) by mouth at bedtime as needed for anxiety. 08/27/17  Yes Gorsuch, Ni, MD  azelastine (ASTELIN) 0.1 % nasal spray Place 2 sprays into both nostrils 2 (two) times daily as needed for allergies.    Yes  [provider]  budesonide-formoterol (SYMBICORT) 160-4.5 MCG/ACT inhaler Inhale 2 puffs into the lungs 2 (two) times daily. 07/02/17  Yes Collene Gobble, MD  Cholecalciferol (VITAMIN D) 2000 units CAPS Take 2,000 Units by mouth daily.   Yes [provider]  diltiazem (CARTIA XT) 120 MG 24 hr capsule TAKE 1 CAPSULE BY MOUTH EVERY DAY 05/03/17  Yes Satira Sark, MD  diphenoxylate-atropine (LOMOTIL) 2.5-0.025 MG tablet Take 1 tablet by mouth 4 (four) times daily. Patient taking differently: Take 1 tablet by mouth 4 (four) times daily as needed for diarrhea or loose stools.  08/21/16  Yes [name], MD  fluticasone [facility]) 50 MCG/ACT nasal spray Place 2 sprays into both nostrils 2 (two) times daily. For nasal congestion 11/20/13  Yes [provider]  furosemide (LASIX) 20 MG tablet Take 1 tablet (20 mg total) by mouth daily. 11/04/17  Yes Gorsuch, Ni, MD  levothyroxine (SYNTHROID, LEVOTHROID) 137 MCG tablet Take 137 mcg by mouth every morning. For thyroid therapy   Yes [provider]  lidocaine-prilocaine (EMLA) cream Apply 1 application topically as needed. 07/30/17  Yes Gorsuch, Ni, MD  loperamide (IMODIUM) 2 MG capsule Take 2 capsules (4 mg total) by mouth 3 (three) times daily. Patient taking differently: Take 4 mg by mouth as needed for diarrhea or loose stools.  07/24/17  Yes Gorsuch, Ernst Spell, MD  magic mouthwash w/lidocaine SOLN Take 5 mLs by mouth 4 (four) times daily. 5 ml QID swish and spit Patient taking differently: Take 5 mLs by mouth 4 (four) times daily as needed for mouth pain. 5 ml QID swish and spit 10/17/17  Yes Gorsuch, Ni, MD  methylcellulose (ARTIFICIAL TEARS) 1 % ophthalmic solution Place 1 drop into both eyes 2 (two) times daily as needed (for dry eyes).    Yes [provider]  mirtazapine (REMERON) 15 MG tablet Take 1 tablet (15 mg total) by mouth at bedtime. 11/04/17  Yes Gorsuch, Ni, MD  ondansetron (ZOFRAN) 8 MG tablet Take 1  tablet (8 mg total) by mouth every 8 (eight) hours as needed for nausea or vomiting. 10/14/17  Yes Gorsuch, Ni, MD  oxyCODONE 10 MG TABS Take 1 tablet (10 mg total) by mouth every 4 (four) hours as needed for severe pain. 11/04/17  Yes Gorsuch, Ni, MD  prochlorperazine (COMPAZINE) 10 MG tablet Take 10 mg by mouth every 6 (six) hours as needed for nausea. 07/05/17  Yes [provider]  senna-docusate (SENOKOT-S) 8.6-50 MG tablet Take 1 tablet by mouth at bedtime. 07/24/17  Yes Gorsuch, Ni, MD  XARELTO 20 MG TABS tablet TAKE 1 TABLET BY MOUTH EVERY DAY WITH SUPPER 04/23/17  Yes Satira Sark, MD  Calcium Carbonate (CALCIUM 600) 1500 MG TABS Take 2 tablets by mouth daily.    12/18/11  [provider]   Allergies  Allergen Reactions  . Codeine Itching    FAMILY HISTORY:  family history includes Colon cancer in her maternal grandfather and mother; Depression in her sister; Heart disease in her father; Lung cancer in her father. SOCIAL HISTORY:  reports that she quit smoking about 37 years ago. Her smoking use included cigarettes. She has a 20.00 pack-year smoking history. she has never used smokeless tobacco. She reports that she drinks alcohol. She reports that she does not use drugs.  REVIEW OF SYSTEMS:   Constitutional: Negative for fever, chills, weight loss, +fatigue and diaphoresis.  HENT: Negative for hearing loss, ear pain, nosebleeds, +congestion, sore throat, neck pain, tinnitus and ear  discharge.   Eyes: Negative for blurred vision, double vision, photophobia, pain, discharge and redness.  Respiratory:+ cough, -hemoptysis, -sputum production, + shortness of breath, wheezing and stridor.   Cardiovascular: Negative for chest pain, palpitations, orthopnea, claudication, leg swelling and PND.  Gastrointestinal: Negative for heartburn, nausea, vomiting, abdominal pain, diarrhea, constipation, blood in stool and melena.  Genitourinary: Negative for dysuria, urgency,  frequency, hematuria and flank pain.  Musculoskeletal: Negative for myalgias, back pain, joint pain and falls.  Skin: Negative for itching and rash.  Neurological: Negative for dizziness, tingling, tremors, sensory change, speech change, focal weakness, seizures, loss of consciousness, weakness and headaches.  Endo/Heme/Allergies: Negative for environmental allergies and polydipsia. Does not bruise/bleed easily.  SUBJECTIVE:  No current distress VITAL SIGNS: Temp:  [98 F (36.7 C)-98.6 F (37 C)] 98 F (36.7 C) (02/04 0559) Pulse Rate:  [89-100] 89 (02/04 0559) Resp:  [18-19] 18 (02/04 0559) BP: (141-150)/(52-65) 150/65 (02/04 0559) SpO2:  [98 %] 98 % (02/04 0729)  PHYSICAL EXAMINATION: General: This 84 year old white female sitting up in chair she is currently in no acute distress however does appear chronically ill Neuro: Awake oriented no focal deficits HEENT: Normocephalic atraumatic no jugular venous distention mucous membranes are moist Cardiovascular: Irregular irregular atrial fibrillation noted on monitor Lungs: Expiratory wheeze, remarkably diminished on the right no accessory muscle use Abdomen: Soft nontender no organomegaly Musculoskeletal: Warm dry brisk cap refill good strength  Recent Labs  Lab 11/08/17 1649 11/09/17 0722 11/10/17 0349  NA 138 136 134*  K 3.9 3.9 5.1  CL 104 101 98*  CO2 28 29 29   BUN 18 14 18   CREATININE 0.43* 0.48 0.69  GLUCOSE 138* 108* 157*   Recent Labs  Lab 11/08/17 1005 11/09/17 0722 11/10/17 0349  HGB 11.0* 9.7* 9.8*  HCT 32.7* 28.1* 29.2*  WBC 21.4* 9.2 3.8*  PLT 121* 91* 93*   No results found.  ASSESSMENT / PLAN:  Acute exacerbation of chronic obstructive pulmonary disease in setting of coronavirus Possible community-acquired pneumonia  Chronic right pleural effusion Atrial fibrillation with rapid ventricular response Ovarian cancer diabetes Anemia of chronic disease  Discussion 84 year old female here with  acute exacerbation of chronic obstructive pulmonary disease in the setting of coronavirus.  No clear pulmonary infiltrates, chest x-ray more favorable for atelectasis, not convinced that we are actually dealing with a pneumonia.  Pro-calcitonin has been unremarkable.  The right effusion is chronic going on approximately 2 months minimally.  It is unclear if this is a consequence of her underlying cancer or possibly a consequence of cancer related malnutrition or underlying cardiac dysfunction yet to be identified.  Certainly possible for chronic effusion to become acutely infected thus making fluid evaluation once again more important.  Plan Hold Xarelto, heparin per pharmacy Plan for therapeutic and diagnostic thoracentesis on 2/6 Antibiotics, day #4 cefepime, it would be reasonable to stop after 5 days of therapy if procalcitonin remains low  Continue scheduled bronchodilators Slow taper steroids Wean oxygen   Erick Colace ACNP-BC [place] Pager # [phone] OR # [phone] if no answer    11/11/2017, 12:06 PM

## 2017-11-11 NOTE — Telephone Encounter (Signed)
11/11/17  daughter called and cx appt .Marland Kitchen.. patient is still in the hospital and unsure if she will be out by end of the week

## 2017-11-11 NOTE — Progress Notes (Signed)
ANTICOAGULATION CONSULT NOTE - Initial Consult  Pharmacy Consult for Heparin while Xarelto on hold Indication: atrial fibrillation  Allergies  Allergen Reactions  . Codeine Itching    Patient Measurements: Height: 5\' 4"  (162.6 cm) Weight: 164 lb 14.5 oz (74.8 kg) IBW/kg (Calculated) : 54.7 Heparin Dosing Weight: 70.3kg  Vital Signs: Temp: 98 F (36.7 C) (02/04 0559) Temp Source: Oral (02/04 0559) BP: 150/65 (02/04 0559) Pulse Rate: 89 (02/04 0559)  Labs: Recent Labs    11/08/17 1649 11/09/17 0722 11/10/17 0349  HGB  --  9.7* 9.8*  HCT  --  28.1* 29.2*  PLT  --  91* 93*  CREATININE 0.43* 0.48 0.69    Estimated Creatinine Clearance: 53.7 mL/min (by C-G formula based on SCr of 0.69 mg/dL).   Medical History: Past Medical History:  Diagnosis Date  . COPD (chronic obstructive pulmonary disease) ([place])   . Depression   . Diverticulosis   . Family hx of colon cancer   . Fibromyalgia   . GERD (gastroesophageal reflux disease)   . Hemorrhoids   . Hiatal hernia   . History of shingles   . Hypothyroidism   . IBS (irritable bowel syndrome)   . Lymphocytic colitis   . Osteoporosis   . Ovarian cancer ([place])   . Paroxysmal atrial fibrillation (HCC)   . Psoriatic arthritis ([place])   . Rectal polyp   . Schatzki's ring   . Uterine polyp     Medications:  Scheduled:  . diltiazem  30 mg Oral Q8H  . feeding supplement (ENSURE ENLIVE)  237 mL Oral BID BM  . fluticasone  2 spray Each Nare BID  . ipratropium-albuterol  3 mL Nebulization QID  . levothyroxine  137 mcg Oral QAC breakfast  . mirtazapine  15 mg Oral QHS  . mometasone-formoterol  2 puff Inhalation BID  . sodium chloride flush  10-40 mL Intracatheter Q12H   Infusions:  . ceFEPime (MAXIPIME) IV    . magnesium sulfate 1 - 4 g bolus IVPB 2 g (11/11/17 1257)   PRN: albuterol, ALPRAZolam, ondansetron (ZOFRAN) IV, oxyCODONE, sodium chloride flush  Assessment: 84 yo female with ovarian cancer currently  undergoing chemotherapy treatment and hx afib on xarelto PTA, presented to the ED on 11/08/2017 with c/o tachycardia, cough and congestion. Xarelto is now on hold for thoracentesis planned on Thursday and Pharmacy is consulted to dose IV heparin as bridge therapy.  Last dose of Xarelto was given 2/3 at 17:16 so heparin will not need to start until tonight 24hr after.  Goal of Therapy:  Heparin level 0.3-0.7 units/ml Monitor platelets by anticoagulation protocol: Yes   Plan:   At 17:00, begin heparin without a bolus at 1000 units/hr  Check heparin level 8hr after starting heparin  Daily heparin level, CBC (note low platelets s/p chemo)  F/u when to stop heparin prior to thoracentesis on Thursday  Peggyann Juba, PharmD, BCPS Pager: [phone] 11/11/2017,1:02 PM

## 2017-11-11 NOTE — Care Management Important Message (Signed)
Important Message  Patient Details IM Letter given to Kathy/Case Manager to present to the Patient Name: [name] MRN: [id number] Date of Birth: 07/02/1933   Medicare Important Message Given:  Yes    Kerin Salen 11/11/2017, 12:37 [place] Message  Patient Details  Name: [name] MRN: [id number] Date of Birth: [birth year]   Medicare Important Message Given:  Yes    Kerin Salen 11/11/2017, 12:37 PM

## 2017-11-11 NOTE — Care Management Note (Signed)
Case Management Note  Patient Details  Name: [name] MRN: [id number] Date of Birth: Nov 25, 1932  Subjective/Objective:   84 y/o f admitted w/PNA. From home,has private duty aide,has rw,cane,home 02-[facility]-has travel tank. Goes to outpatient PT.                  Action/Plan:d/c plan home.   Expected Discharge Date:  (unknown)               Expected Discharge Plan:  Home/Self Care  In-House Referral:     Discharge planning Services  CM Consult  Post Acute Care Choice:  Durable Medical Equipment(home 02- apothecary,rw,cane) Choice offered to:     DME Arranged:    DME Agency:     HH Arranged:    HH Agency:     Status of Service:  In process, will continue to follow  If discussed at Long Length of Stay Meetings, dates discussed:    Additional Comments:  Dessa Phi, RN 11/11/2017, 3:02 PM

## 2017-11-11 NOTE — Progress Notes (Signed)
[name]   DOB:[birth year]   HO#:[id number]    Her daughter requested the patient to be seen in follow while hospitalized.  Patient is well-known to me.  Summary of oncologic history as follows:   Ovarian CA, right ([place])   02/18/2016 Tumor Marker    Patient's tumor was tested for the following markers: CA125 Results of the tumor marker test revealed 45      05/22/2016 Tumor Marker    Patient's tumor was tested for the following markers: CA125 Results of the tumor marker test revealed 53      05/22/2016 Imaging    Outside pelvic US showed 4.1 cm adnexa mass      06/24/2017 Imaging    Ct abdomen and pelvis:  1. Interim finding of moderate ascites within the abdomen and pelvis with additional finding of diffuse nodular infiltration of the omentum and anterior mesenteric fat, the appearance would be consistent with peritoneal carcinomatosis/metastatic disease. Increasing retroperitoneal and upper abdominal adenopathy. 2. Re- demonstrated 3.8 cm cyst in the right adnexa. Enlarging soft tissue density in the left adnexa now with possible cystic component posteriorly. In light of the above findings, concern is for ovarian neoplasm. Correlation with pelvic ultrasound recommended. 3. Small right-sided pleural effusion, new since prior study 4. Stable hypodense splenic lesions since 2017.       06/25/2017 Imaging    US pelvis: 2.9 cm simple appearing cyst in the right ovary. Left ovary grossly unremarkable. Large volume ascites in the pelvis      06/30/2017 - 07/01/2017 Hospital Admission    She was admitted for evaluation of abdominal pain and ascites      07/01/2017 Pathology Results    PERITONEAL/ASCITIC FLUID(SPECIMEN 1 OF 1 COLLECTED 07/01/17): - POORLY DIFFERENTIATED CARCINOMA; SEE COMMENT Source Peritoneal/Ascitic Fluid, (specimen 1 of 1 collected 07/01/17) Gross Specimen: Received is/are 1000 cc's of brownish fluid. (BS:bs) Prepared: # Smears: 0 # Concentration Technique  Slides (i.e. ThinPrep): 1 # Cell Block: 1 Additional Studies: Also received Hematology slide - [id number]. Comment The tumor cells are positive for cytokeratin 7 and Pax-8 but negative for cytokeratin 20, CDX-2, GATA-3, Napsin-A and TTF-1. Based on the immunoprofile a gynecology primary is favored      07/01/2017 Procedure    Successful ultrasound-guided diagnostic and therapeutic paracentesis yielding 2.5 liters of peritoneal fluid      07/07/2017 - 07/09/2017 Hospital Admission    She was admitted for management of malignant ascites      07/08/2017 Procedure    Successful ultrasound-guided therapeutic paracentesis yielding 2.7 liters liters of peritoneal fluid      07/12/2017 Procedure    Successful ultrasound-guided paracentesis yielding 1450 mL of peritoneal fluid      07/18/2017 - 07/24/2017 Hospital Admission    She was admitted for expedited treatment      07/18/2017 Tumor Marker    Patient's tumor was tested for the following markers: CA125 Results of the tumor marker test revealed 1941      07/19/2017 -  Chemotherapy    The patient had palonosetron (ALOXI) injection 0.25 mg, 0.25 mg, Intravenous,  Once, 1 of 6 cycles  pegfilgrastim (NEULASTA) injection 6 mg, 6 mg, Subcutaneous,  Once, 1 of 6 cycles  CARBOplatin (PARAPLATIN) 490 mg in sodium chloride 0.9 % 250 mL chemo infusion, 490 mg (100 % of original dose 487.2 mg), Intravenous,  Once, 1 of 6 cycles Dose modification:   (original dose 487.2 mg, Cycle 1)  PACLitaxel (TAXOL) 336 mg in dextrose 5 %  500 mL chemo infusion (> 33m/m2), 175 mg/m2 = 336 mg, Intravenous,  Once, 1 of 6 cycles  fosaprepitant (EMEND) 150 mg, dexamethasone (DECADRON) 12 mg in sodium chloride 0.9 % 145 mL IVPB, , Intravenous,  Once, 1 of 6 cycles  for chemotherapy treatment.        08/06/2017 Procedure    Successful ultrasound-guided therapeutic paracentesis yielding 2.6 liters of peritoneal fluid.      08/09/2017 Tumor Marker    Patient's  tumor was tested for the following markers: CA125 Results of the tumor marker test revealed 1665      08/15/2017 Tumor Marker    Patient's tumor was tested for the following markers: CA125 Results of the tumor marker test revealed 937.9      08/20/2017 Imaging    ECHO: Normal LV size with EF 60-65%. Normal RV size and systolic function. No significant valvular abnormalities.      09/18/2017 Imaging    Chest Impression:  1. No evidence thoracic metastasis. 2. Interval increase and RIGHT pleural effusion.  Abdomen / Pelvis Impression:  1. Interval decrease in intraperitoneal free fluid. 2. Interval decrease in omental nodularity in the LEFT ventral peritoneal space. 3. Interval decrease in nodularity associated with the LEFT ovary. 4. Cystic portion of the RIGHT ovary is increased mildly in size.      09/20/2017 Tumor Marker    Patient's tumor was tested for the following markers: CA125 Results of the tumor marker test revealed 347      10/14/2017 Tumor Marker    Patient's tumor was tested for the following markers: CA125 Results of the tumor marker test revealed 307.4      11/04/2017 Tumor Marker    Patient's tumor was tested for the following markers: CA125 Results of the tumor marker test revealed 262.5       Subjective: She has just received treatment last week.  She was hospitalized since 11/08/2017 after presentation with significant shortness of breath.  Imaging study review enlarging pleural effusion with associated possible pneumonia.  She was admitted for broad-spectrum intravenous antibiotics and transitioning of anticoagulation therapy to IV heparin in anticipation for thoracentesis. Since admission, she felt a little better.  She continues to feel weak. Respiratory panel came back positive for coronavirus. The patient denies any recent signs or symptoms of bleeding such as spontaneous epistaxis, hematuria or hematochezia.   Objective:  Vitals:   11/11/17 0729  11/11/17 1322  BP:  (!) 161/69  Pulse:  (!) 103  Resp:    Temp:  98.3 F (36.8 C)  SpO2: 98% 100%     Intake/Output Summary (Last 24 hours) at 11/11/2017 1706 Last data filed at 11/11/2017 1543 Gross per 24 hour  Intake 50 ml  Output 2100 ml  Net -2050 ml    GENERAL:alert, no distress and comfortable.  She looks weak and frail SKIN: skin color, texture, turgor are normal, no rashes or significant lesions EYES: normal, Conjunctiva are pink and non-injected, sclera clear OROPHARYNX:no exudate, no erythema and lips, buccal mucosa, and tongue normal  NECK: supple, thyroid normal size, non-tender, without nodularity LYMPH:  no palpable lymphadenopathy in the cervical, axillary or inguinal LUNGS: Reduced breath sound throughout the right lung with wheezes  hEART: regular rate & rhythm and no murmurs and no lower extremity edema ABDOMEN:abdomen soft, non-tender and normal bowel sounds Musculoskeletal:no cyanosis of digits and no clubbing  NEURO: alert & oriented x 3 with fluent speech, no focal motor/sensory deficits   Labs:  Lab Results  Component Value Date   WBC 3.8 (L) 11/10/2017   HGB 9.8 (L) 11/10/2017   HCT 29.2 (L) 11/10/2017   MCV 97.3 11/10/2017   PLT 93 (L) 11/10/2017   NEUTROABS 20.1 (H) 11/08/2017    Lab Results  Component Value Date   NA 134 (L) 11/10/2017   K 5.1 11/10/2017   CL 98 (L) 11/10/2017   CO2 29 11/10/2017    Assessment & Plan:   Ovarian CA, right (HCC) We had a long discussion about plan of care She is very frail She has recently completed 6 cycles of treatment last week Continue supportive care  Pancytopenia, acquired ([place]) She is frail She has lost a lot of weight She does not need blood transfusion There is no contraindication to remain on antiplatelet agents or anticoagulants as long as the platelet is greater than 50,000.  COPD (chronic obstructive pulmonary disease) (HCC) exacerbation with healthcare associated pneumonia Continue IV  antibiotics, oxygen and nebulizers as needed  Generalized weakness I will consult physical therapy for evaluation. The patient may benefit from skilled nursing facility upon discharge  Pleural effusion on right She has worsening pleural effusion.  Agree with thoracentesis  Fluid overload, SOB The patient had recent fluid overload requiring diuretic therapy I will check BNP tomorrow  Discharge planning She is very weak She might benefit from skilled nursing facility placement upon discharge  Heath Lark, MD 11/11/2017  5:06 PM

## 2017-11-11 NOTE — Progress Notes (Signed)
PROGRESS NOTE  [name]  EXH:[id number] DOB: 09/21/1933 DOA: 11/08/2017 PCP: Asencion Noble, MD  Outpatient Specialists: Pulmonology, [place]. Oncology, Alvy Bimler Brief Narrative: [name] is a 85 y.o. female with a history of AFib on xarelto and ovarian CA undergoing chemotherapy who presented with dyspnea and productive cough found to have possible RML infiltrate and large pleural effusion, RVP demonstrated coronavirus infection. Antibiotics were started and ultimately pulmonology consulted for ongoing respiratory distress. AFib with RVR was present on admission, since improved. Plan is for diagnostic and therapeutic thoracentesis after anticoagulant washout.   Assessment & Plan: Principal Problem:   PNA (pneumonia) Active Problems:   COPD (chronic obstructive pulmonary disease) (HCC)   Atrial fibrillation (HCC)   Atrial fibrillation with RVR (HCC)   HCAP (healthcare-associated pneumonia)  COPD exacerbation with coronaviral bronchitis, and RML effusion:  - Holding xarelto, pulmonology consulted, planning diagnostic and therapeutic thoracentesis later this week.  - Continue steroids - Continue breathing treatments.  - Monitor PCT in AM, DC abx at 5 days if low.  - Taper oxygen as able.  - CXR in AM  AFib with RVR: Improved, converted to NSR.  - Continue cardizem (tapered off diltiazem infusion earlier in admission) - Replace K and Mg as indicated  Ovarian CA: Undergoing chemotherapy, most recently 1/28.  - Will need cytology sent from pleural effusion.  - Dr. Alvy Bimler added to treatment team  Depression: Chronic, stable - Continue remeron, prn xanax  Hypothyroidism: TSH 0.388.  - Continue synthroid  DVT prophylaxis: Heparin infusion (holding xarelto) Code Status: Full Family Communication: Daughter at bedside Disposition Plan: Uncertain  Consultants:   Pulmonology  Procedures:   None  Antimicrobials:  Vancomycin 2/1 - 2/4  Cefepime 2/1 >>     Subjective: Dyspnea is stable. No chest pain. No fever.   Objective: Vitals:   11/10/17 2150 11/11/17 0559 11/11/17 0729 11/11/17 1322  BP: (!) 141/55 (!) 150/65  (!) 161/69  Pulse: 98 89  (!) 103  Resp: 19 18    Temp: 98.6 F (37 C) 98 F (36.7 C)  98.3 F (36.8 C)  TempSrc: Oral Oral  Oral  SpO2: 98% 98% 98% 100%  Weight:      Height:        Intake/Output Summary (Last 24 hours) at 11/11/2017 1351 Last data filed at 11/11/2017 0746 Gross per 24 hour  Intake 50 ml  Output 1500 ml  Net -1450 ml   Filed Weights   11/08/17 2254  Weight: 74.8 kg (164 lb 14.5 oz)    Gen: 84 y.o. female in no distress  Pulm: Non-labored breathing. Very decreased right base/mid zones.  CV: Regular rate and rhythm. No murmur, rub, or gallop. No JVD, no pedal edema. GI: Abdomen soft, non-tender, non-distended, with normoactive bowel sounds. No organomegaly or masses felt. Ext: Warm, no deformities Skin: No rashes, lesions no ulcers Neuro: Alert and oriented. No focal neurological deficits. Psych: Judgement and insight appear normal. Mood & affect appropriate.   Data Reviewed: I have personally reviewed following labs and imaging studies  CBC: Recent Labs  Lab 11/08/17 1005 11/09/17 0722 11/10/17 0349  WBC 21.4* 9.2 3.8*  NEUTROABS 20.1*  --   --   HGB 11.0* 9.7* 9.8*  HCT 32.7* 28.1* 29.2*  MCV 96.7 96.9 97.3  PLT 121* 91* 93*   Basic Metabolic Panel: Recent Labs  Lab 11/08/17 1005 11/08/17 1649 11/09/17 0722 11/10/17 0349  NA 138 138 136 134*  K 3.0* 3.9 3.9 5.1  CL  97* 104 101 98*  CO2 32 28 29 29   GLUCOSE 132* 138* 108* 157*  BUN 22* 18 14 18   CREATININE 0.64 0.43* 0.48 0.69  CALCIUM 8.8* 8.1* 8.4* 8.8*  MG 1.5*  --   --   --    GFR: Estimated Creatinine Clearance: 53.7 mL/min (by C-G formula based on SCr of 0.69 mg/dL). Liver Function Tests: Recent Labs  Lab 11/09/17 0722  AST 18  ALT 19  ALKPHOS 50  BILITOT 0.7  PROT 5.8*  ALBUMIN 3.0*   No results for  input(s): LIPASE, AMYLASE in the last 168 hours. No results for input(s): AMMONIA in the last 168 hours. Coagulation Profile: No results for input(s): INR, PROTIME in the last 168 hours. Cardiac Enzymes: No results for input(s): CKTOTAL, CKMB, CKMBINDEX, TROPONINI in the last 168 hours. BNP (last 3 results) Recent Labs    08/12/17 1137  PROBNP 837*   HbA1C: No results for input(s): HGBA1C in the last 72 hours. CBG: No results for input(s): GLUCAP in the last 168 hours. Lipid Profile: No results for input(s): CHOL, HDL, LDLCALC, TRIG, CHOLHDL, LDLDIRECT in the last 72 hours. Thyroid Function Tests: Recent Labs    11/10/17 0349  TSH 0.388   Anemia Panel: No results for input(s): VITAMINB12, FOLATE, FERRITIN, TIBC, IRON, RETICCTPCT in the last 72 hours. Urine analysis:    Component Value Date/Time   COLORURINE YELLOW 06/30/2017 1235   APPEARANCEUR HAZY (A) 06/30/2017 1235   LABSPEC 1.030 10/11/2017 1125   PHURINE 6.0 10/11/2017 1125   PHURINE 6.0 06/30/2017 1235   GLUCOSEU Negative 10/11/2017 1125   HGBUR Small 10/11/2017 1125   HGBUR MODERATE (A) 06/30/2017 1235   BILIRUBINUR Negative 10/11/2017 1125   KETONESUR Negative 10/11/2017 1125   KETONESUR 40 (A) 06/30/2017 1235   PROTEINUR 30 10/11/2017 1125   PROTEINUR 100 (A) 06/30/2017 1235   UROBILINOGEN 0.2 10/11/2017 1125   NITRITE Negative 10/11/2017 1125   NITRITE NEGATIVE 06/30/2017 1235   LEUKOCYTESUR Negative 10/11/2017 1125   Recent Results (from the past 240 hour(s))  Blood Culture (routine x 2)     Status: None (Preliminary result)   Collection Time: 11/08/17 11:15 AM  Result Value Ref Range Status   Specimen Description   Final    BLOOD LEFT ANTECUBITAL Performed at [facility] [address], [place], [place] [address]    Special Requests   Final    BOTTLES DRAWN AEROBIC AND ANAEROBIC Blood Culture adequate volume Performed at [facility] [address]  [place], [place] [address]    Culture   Final    NO GROWTH 2 DAYS Performed at [facility] [address] [place], [place] [address]    Report Status PENDING  Incomplete  Blood Culture (routine x 2)     Status: None (Preliminary result)   Collection Time: 11/08/17 10:39 PM  Result Value Ref Range Status   Specimen Description   Final    BLOOD RIGHT ARM Performed at [facility] [address] [place], [place] [address]    Special Requests   Final    IN PEDIATRIC BOTTLE Blood Culture adequate volume Performed at [facility] [address] [place],  [address]    Culture   Final    NO GROWTH 1 DAY Performed at [facility], [place] [address] [place],  [address]    Report Status PENDING  Incomplete  Respiratory Panel by PCR  Status: Abnormal   Collection Time: 11/08/17 11:00 PM  Result Value Ref Range Status   Adenovirus NOT DETECTED NOT DETECTED Final   Coronavirus 229E NOT DETECTED NOT DETECTED Final   Coronavirus HKU1 NOT DETECTED NOT DETECTED Final   Coronavirus NL63 NOT DETECTED NOT DETECTED Final   Coronavirus OC43 DETECTED (A) NOT DETECTED Final   Metapneumovirus NOT DETECTED NOT DETECTED Final   Rhinovirus / Enterovirus NOT DETECTED NOT DETECTED Final   Influenza A NOT DETECTED NOT DETECTED Final   Influenza B NOT DETECTED NOT DETECTED Final   Parainfluenza Virus 1 NOT DETECTED NOT DETECTED Final   Parainfluenza Virus 2 NOT DETECTED NOT DETECTED Final   Parainfluenza Virus 3 NOT DETECTED NOT DETECTED Final   Parainfluenza Virus 4 NOT DETECTED NOT DETECTED Final   Respiratory Syncytial Virus NOT DETECTED NOT DETECTED Final   Bordetella pertussis NOT DETECTED NOT DETECTED Final   Chlamydophila pneumoniae NOT DETECTED NOT DETECTED Final   Mycoplasma pneumoniae NOT DETECTED NOT DETECTED Final    Comment: Performed at [facility], [place] [address], [place], [place] [address]  MRSA PCR Screening      Status: None   Collection Time: 11/08/17 11:00 PM  Result Value Ref Range Status   MRSA by PCR NEGATIVE NEGATIVE Final    Comment:        The GeneXpert MRSA Assay (FDA approved for NASAL specimens only), is one component of a comprehensive MRSA colonization surveillance program. It is not intended to diagnose MRSA infection nor to guide or monitor treatment for MRSA infections. Performed at [facility], [place] [address] [place], [place] [address]       Radiology Studies: No results found.  Scheduled Meds: . diltiazem  30 mg Oral Q8H  . feeding supplement (ENSURE ENLIVE)  237 mL Oral BID BM  . fluticasone  2 spray Each Nare BID  . ipratropium-albuterol  3 mL Nebulization QID  . levothyroxine  137 mcg Oral QAC breakfast  . mirtazapine  15 mg Oral QHS  . mometasone-formoterol  2 puff Inhalation BID  . sodium chloride flush  10-40 mL Intracatheter Q12H   Continuous Infusions: . ceFEPime (MAXIPIME) IV    . heparin    . magnesium sulfate 1 - 4 g bolus IVPB 2 g (11/11/17 1257)     LOS: 3 days   Time spent: 25 minutes.  Vance Gather, MD [facility] Pager [phone]  If 7PM-7AM, please contact night-coverage [email or web address] Password [email or web address] 11/11/2017, 1:51 PM

## 2017-11-12 ENCOUNTER — Inpatient Hospital Stay (HOSPITAL_COMMUNITY): Payer: Medicare Other

## 2017-11-12 ENCOUNTER — Telehealth: Payer: Self-pay

## 2017-11-12 ENCOUNTER — Encounter (HOSPITAL_COMMUNITY): Payer: Self-pay

## 2017-11-12 DIAGNOSIS — Z9221 Personal history of antineoplastic chemotherapy: Secondary | ICD-10-CM

## 2017-11-12 DIAGNOSIS — R5381 Other malaise: Secondary | ICD-10-CM

## 2017-11-12 DIAGNOSIS — K5900 Constipation, unspecified: Secondary | ICD-10-CM

## 2017-11-12 DIAGNOSIS — D701 Agranulocytosis secondary to cancer chemotherapy: Secondary | ICD-10-CM

## 2017-11-12 LAB — APTT
APTT: 69 s — AB (ref 24–36)
APTT: 51 s — AB (ref 24–36)
aPTT: 66 seconds — ABNORMAL HIGH (ref 24–36)

## 2017-11-12 LAB — BASIC METABOLIC PANEL
ANION GAP: 9 (ref 5–15)
BUN: 19 mg/dL (ref 6–20)
CALCIUM: 8.7 mg/dL — AB (ref 8.9–10.3)
CO2: 30 mmol/L (ref 22–32)
CREATININE: 0.6 mg/dL (ref 0.44–1.00)
Chloride: 95 mmol/L — ABNORMAL LOW (ref 101–111)
GFR calc non Af Amer: >60 mL/min (ref >60)
Glucose, Bld: 158 mg/dL — ABNORMAL HIGH (ref 65–99)
Potassium: 3.7 mmol/L (ref 3.5–5.1)
Sodium: 134 mmol/L — ABNORMAL LOW (ref 135–145)

## 2017-11-12 LAB — CBC WITH DIFFERENTIAL/PLATELET
BASOS ABS: 0 10*3/uL (ref 0.0–0.1)
BASOS PCT: 0 %
EOS ABS: 0 10*3/uL (ref 0.0–0.7)
Eosinophils Relative: 1 %
HCT: 27.5 % — ABNORMAL LOW (ref 36.0–46.0)
Hemoglobin: 9.3 g/dL — ABNORMAL LOW (ref 12.0–15.0)
Lymphocytes Relative: 36 %
Lymphs Abs: 0.6 10*3/uL — ABNORMAL LOW (ref 0.7–4.0)
MCH: 32.6 pg (ref 26.0–34.0)
MCHC: 33.8 g/dL (ref 30.0–36.0)
MCV: 96.5 fL (ref 78.0–100.0)
Monocytes Absolute: 0.3 10*3/uL (ref 0.1–1.0)
Monocytes Relative: 18 %
NEUTROS ABS: 0.7 10*3/uL — AB (ref 1.7–7.7)
Neutrophils Relative %: 45 %
Platelets: 111 10*3/uL — ABNORMAL LOW (ref 150–400)
RBC: 2.85 MIL/uL — ABNORMAL LOW (ref 3.87–5.11)
RDW: 14 % (ref 11.5–15.5)
WBC: 1.6 10*3/uL — ABNORMAL LOW (ref 4.0–10.5)

## 2017-11-12 LAB — BRAIN NATRIURETIC PEPTIDE: B NATRIURETIC PEPTIDE 5: 76.7 pg/mL (ref 0.0–100.0)

## 2017-11-12 LAB — MAGNESIUM: MAGNESIUM: 1.8 mg/dL (ref 1.7–2.4)

## 2017-11-12 LAB — PROCALCITONIN

## 2017-11-12 LAB — HEPARIN LEVEL (UNFRACTIONATED): HEPARIN UNFRACTIONATED: 0.78 [IU]/mL — AB (ref 0.30–0.70)

## 2017-11-12 MED ORDER — DEXTROSE 5 % IV SOLN
1.0000 g | Freq: Three times a day (TID) | INTRAVENOUS | Status: AC
  Administered 2017-11-12: 1 g via INTRAVENOUS
  Filled 2017-11-12: qty 1

## 2017-11-12 MED ORDER — HEPARIN (PORCINE) IN NACL 100-0.45 UNIT/ML-% IJ SOLN: 1100.0000 [IU]/h | INTRAMUSCULAR | Status: DC

## 2017-11-12 MED ORDER — TBO-FILGRASTIM 300 MCG/0.5ML ~~LOC~~ SOSY
300.0000 ug | PREFILLED_SYRINGE | Freq: Once | SUBCUTANEOUS | Status: AC
  Administered 2017-11-12: 300 ug via SUBCUTANEOUS
  Filled 2017-11-12: qty 0.5

## 2017-11-12 MED ORDER — IPRATROPIUM-ALBUTEROL 0.5-2.5 (3) MG/3ML IN SOLN
3.0000 mL | Freq: Three times a day (TID) | RESPIRATORY_TRACT | Status: DC
  Administered 2017-11-12 – 2017-11-15 (×7): 3 mL via RESPIRATORY_TRACT
  Filled 2017-11-12 (×8): qty 3

## 2017-11-12 MED ORDER — HEPARIN (PORCINE) IN NACL 100-0.45 UNIT/ML-% IJ SOLN: 1250.0000 [IU]/h | INTRAMUSCULAR | Status: DC

## 2017-11-12 MED ORDER — HEPARIN (PORCINE) IN NACL 100-0.45 UNIT/ML-% IJ SOLN
1150.0000 [IU]/h | INTRAMUSCULAR | Status: DC
  Administered 2017-11-12: 1150 [IU]/h via INTRAVENOUS
  Filled 2017-11-12: qty 250

## 2017-11-12 NOTE — Progress Notes (Signed)
[name]   DOB:[birth year]   ID#:[id number]    Subjective: [name] is feeling well today.  Shortness of breath is stable.  [name] denies fever or chills overnight.  [name] remains constipated.  Objective:  Vitals:   11/12/17 0737 11/12/17 0814  BP:  136/61  Pulse:    Resp:    Temp:    SpO2: 96%      Intake/Output Summary (Last 24 hours) at 11/12/2017 0902 Last data filed at 11/12/2017 0600 Gross per 24 hour  Intake 346.94 ml  Output 1200 ml  Net -853.06 ml    GENERAL:alert, no distress and comfortable NEURO: alert & oriented x 3 with fluent speech, no focal motor/sensory deficits   Labs:  Lab Results  Component Value Date   WBC 1.6 (L) 11/12/2017   HGB 9.3 (L) 11/12/2017   HCT 27.5 (L) 11/12/2017   MCV 96.5 11/12/2017   PLT 111 (L) 11/12/2017   NEUTROABS 0.7 (L) 11/12/2017    Lab Results  Component Value Date   NA 134 (L) 11/12/2017   K 3.7 11/12/2017   CL 95 (L) 11/12/2017   CO2 30 11/12/2017    Studies:  Dg Chest Port 1 View  Result Date: 11/12/2017 CLINICAL DATA:  Shortness of breath.  COPD.  Pleural effusion. EXAM: PORTABLE CHEST 1 VIEW COMPARISON:  11/08/2017 plain film and CT. FINDINGS: Right-sided Port-A-Cath terminates at the mid right atrium. Midline trachea. Normal heart size. Atherosclerosis in the transverse aorta. Right-sided pleural effusion is slightly increased. No pneumothorax. Worsened right middle and lower lobe airspace disease. Clear left lung. Underlying hyperinflation. IMPRESSION: Worsened right-sided aeration with increased pleural fluid and mid/lower lung airspace disease. Aortic Atherosclerosis (ICD10-I70.0). Electronically Signed   By: Abigail Miyamoto M.D.   On: 11/12/2017 08:02    Assessment & Plan:  Ovarian CA, right (HCC) We had a long discussion about plan of care [name] is very frail [name] has recently completed 6 cycles of treatment last week Continue supportive care  Pancytopenia, acquired ([place]) [name] is frail [name] has lost a lot of weight [name]  does not need blood transfusion There is no contraindication to remain on antiplatelet agents or anticoagulants as long as the platelet is greater than 50,000. Her neutropenia is due to recent chemotherapy.  I recommend G-CSF support and [name] agreed to proceed  COPD (chronic obstructive pulmonary disease) (HCC) exacerbation with healthcare associated pneumonia Continue IV antibiotics, oxygen and nebulizers as needed  Generalized weakness I will consult physical therapy for evaluation. The patient may benefit from skilled nursing facility upon discharge  Pleural effusion on right [name] has worsening pleural effusion.  Agree with thoracentesis  Fluid overload, SOB The patient had recent fluid overload requiring diuretic therapy BNP is within normal limits  Severe constipation Continue laxatives  Discharge planning [name] is very weak [name] might benefit from skilled nursing facility placement upon discharge     Heath Lark, MD 11/12/2017  9:02 AM

## 2017-11-12 NOTE — Telephone Encounter (Signed)
Pt's dtr, [name], called stating her mother wants to return to rehab after discharge from the hospital.  She is not sure whether Medicare will pay for pt's return.  In Basket msg sent to [employer], clinical SW for their input.

## 2017-11-12 NOTE — Progress Notes (Signed)
PROGRESS NOTE  [name]  ZOX:[id number] DOB: 04/29/1933 DOA: 11/08/2017 PCP: Laura Noble, MD  Outpatient Specialists: Pulmonology, [name]. Oncology, [name] Brief Narrative: [name] is a 84 y.o. female with a history of AFib on xarelto and ovarian CA undergoing chemotherapy who presented with dyspnea and productive cough found to have possible RML infiltrate and large pleural effusion, RVP demonstrated coronavirus infection. Antibiotics were started and ultimately pulmonology consulted for ongoing respiratory distress. AFib with RVR was present on admission, since improved. Plan is for diagnostic and therapeutic thoracentesis after xarelto has been held.  Assessment & Plan: Principal Problem:   PNA (pneumonia) Active Problems:   COPD (chronic obstructive pulmonary disease) (HCC)   Atrial fibrillation (HCC)   Atrial fibrillation with RVR (HCC)   HCAP (healthcare-associated pneumonia)  COPD exacerbation with coronaviral bronchitis, and RML effusion:  - Holding xarelto, pulmonology consulted, planning diagnostic and therapeutic thoracentesis later this week, possibly Thursday. Repeat CXR indicates ongoing effusion.  - Completed course of steroids, no rebound/wheezing noted. - Continue breathing treatments.  - PCT <0.10 on 2/4, will DC antibiotics 2/5 after 5 days Tx.  - Taper oxygen as able.   AFib with RVR: Improved, converted to NSR. BNP 76.7.  - Continue cardizem (tapered off diltiazem infusion earlier in admission) - Replace K and Mg as indicated - Heparin gtt while holding xarelto  Ovarian CA: Undergoing chemotherapy, most recently 1/28.  - Will need cytology sent from pleural effusion.  - Dr. Alvy [name] has seen the patient while admitted.  Pancytopenia with neutropenia:  - Granix ordered   Constipation:  - Bowel regimen ordered  Depression: Chronic, stable - Continue remeron, prn xanax  Hypothyroidism: TSH 0.388.  - Continue synthroid  DVT prophylaxis:  Heparin infusion (holding xarelto) Code Status: Full Family Communication: None at bedside Disposition Plan: SNF per PT. Will consult CSW. Will need to be clinically improved prior to DC.   Consultants:   Pulmonology  Procedures:   None  Antimicrobials:  Vancomycin 2/1 - 2/4  Cefepime 2/1 - 2/5   Subjective: Started to eat a little more. Dyspnea remains stable. No chest pain. No fever.   Objective: Vitals:   11/12/17 0814 11/12/17 1147 11/12/17 1311 11/12/17 1334  BP: 136/61   (!) 114/50  Pulse:   (!) 113 98  Resp:    18  Temp:    98 F (36.7 C)  TempSrc:    Oral  SpO2:  97% 94% 94%  Weight:      Height:        Intake/Output Summary (Last 24 hours) at 11/12/2017 1340 Last data filed at 11/12/2017 0900 Gross per 24 hour  Intake 586.94 ml  Output 1200 ml  Net -613.06 ml   Filed Weights   11/08/17 2254  Weight: 74.8 kg (164 lb 14.5 oz)    Gen: 84 y.o. female in no distress  Pulm: Non-labored breathing. Very decreased right-sided sounds at lower/mid zones. No crackles or wheezes. CV: Regular rate and rhythm. No murmur, rub, or gallop. No JVD, no pedal edema. GI: Abdomen soft, non-tender, non-distended, with normoactive bowel sounds. No organomegaly or masses felt. Ext: Warm, no deformities Skin: No rashes, lesions no ulcers Neuro: Alert and oriented. No focal neurological deficits. Psych: Judgement and insight appear normal. Mood & affect appropriate.   Data Reviewed: I have personally reviewed following labs and imaging studies  CBC: Recent Labs  Lab 11/08/17 1005 11/09/17 0722 11/10/17 0349 11/12/17 0102  WBC 21.4* 9.2 3.8* 1.6*  NEUTROABS 20.1*  --   --  0.7*  HGB 11.0* 9.7* 9.8* 9.3*  HCT 32.7* 28.1* 29.2* 27.5*  MCV 96.7 96.9 97.3 96.5  PLT 121* 91* 93* 364*   Basic Metabolic Panel: Recent Labs  Lab 11/08/17 1005 11/08/17 1649 11/09/17 0722 11/10/17 0349 11/12/17 0050  NA 138 138 136 134* 134*  K 3.0* 3.9 3.9 5.1 3.7  CL 97* 104 101 98*  95*  CO2 32 28 29 29 30   GLUCOSE 132* 138* 108* 157* 158*  BUN 22* 18 14 18 19   CREATININE 0.64 0.43* 0.48 0.69 0.60  CALCIUM 8.8* 8.1* 8.4* 8.8* 8.7*  MG 1.5*  --   --   --  1.8   GFR: Estimated Creatinine Clearance: 53.7 mL/min (by C-G formula based on SCr of 0.6 mg/dL). Liver Function Tests: Recent Labs  Lab 11/09/17 0722  AST 18  ALT 19  ALKPHOS 50  BILITOT 0.7  PROT 5.8*  ALBUMIN 3.0*   No results for input(s): LIPASE, AMYLASE in the last 168 hours. No results for input(s): AMMONIA in the last 168 hours. Coagulation Profile: No results for input(s): INR, PROTIME in the last 168 hours. Cardiac Enzymes: No results for input(s): CKTOTAL, CKMB, CKMBINDEX, TROPONINI in the last 168 hours. BNP (last 3 results) Recent Labs    08/12/17 1137  PROBNP 837*   HbA1C: No results for input(s): HGBA1C in the last 72 hours. CBG: No results for input(s): GLUCAP in the last 168 hours. Lipid Profile: No results for input(s): CHOL, HDL, LDLCALC, TRIG, CHOLHDL, LDLDIRECT in the last 72 hours. Thyroid Function Tests: Recent Labs    11/10/17 0349  TSH 0.388   Anemia Panel: No results for input(s): VITAMINB12, FOLATE, FERRITIN, TIBC, IRON, RETICCTPCT in the last 72 hours. Urine analysis:    Component Value Date/Time   COLORURINE YELLOW 06/30/2017 1235   APPEARANCEUR HAZY (A) 06/30/2017 1235   LABSPEC 1.030 10/11/2017 1125   PHURINE 6.0 10/11/2017 1125   PHURINE 6.0 06/30/2017 1235   GLUCOSEU Negative 10/11/2017 1125   HGBUR Small 10/11/2017 1125   HGBUR MODERATE (A) 06/30/2017 1235   BILIRUBINUR Negative 10/11/2017 1125   KETONESUR Negative 10/11/2017 1125   KETONESUR 40 (A) 06/30/2017 1235   PROTEINUR 30 10/11/2017 1125   PROTEINUR 100 (A) 06/30/2017 1235   UROBILINOGEN 0.2 10/11/2017 1125   NITRITE Negative 10/11/2017 1125   NITRITE NEGATIVE 06/30/2017 1235   LEUKOCYTESUR Negative 10/11/2017 1125   Recent Results (from the past 240 hour(s))  Blood Culture (routine x  2)     Status: None (Preliminary result)   Collection Time: 11/08/17 11:15 AM  Result Value Ref Range Status   Specimen Description   Final    BLOOD LEFT ANTECUBITAL Performed at [facility] [address] [place], [place] [address]    Special Requests   Final    BOTTLES DRAWN AEROBIC AND ANAEROBIC Blood Culture adequate volume Performed at [facility] [address], [place], [place] [address]    Culture   Final    NO GROWTH 3 DAYS Performed at [facility], [place] [address] [place], [place] [address]    Report Status PENDING  Incomplete  Blood Culture (routine x 2)     Status: None (Preliminary result)   Collection Time: 11/08/17 10:39 PM  Result Value Ref Range Status   Specimen Description   Final    BLOOD RIGHT ARM Performed at [facility] [address], [place], [place] [address]    Special Requests  Final    IN PEDIATRIC BOTTLE Blood Culture adequate volume Performed at [facility] [address], [place], [place] [address]    Culture   Final    NO GROWTH 2 DAYS Performed at [facility] [address] [place], [place] [address]    Report Status PENDING  Incomplete  Respiratory Panel by PCR     Status: Abnormal   Collection Time: 11/08/17 11:00 PM  Result Value Ref Range Status   Adenovirus NOT DETECTED NOT DETECTED Final   Coronavirus 229E NOT DETECTED NOT DETECTED Final   Coronavirus HKU1 NOT DETECTED NOT DETECTED Final   Coronavirus NL63 NOT DETECTED NOT DETECTED Final   Coronavirus OC43 DETECTED (A) NOT DETECTED Final   Metapneumovirus NOT DETECTED NOT DETECTED Final   Rhinovirus / Enterovirus NOT DETECTED NOT DETECTED Final   Influenza A NOT DETECTED NOT DETECTED Final   Influenza B NOT DETECTED NOT DETECTED Final   Parainfluenza Virus 1 NOT DETECTED NOT DETECTED Final   Parainfluenza Virus 2 NOT DETECTED NOT DETECTED Final   Parainfluenza Virus 3 NOT DETECTED  NOT DETECTED Final   Parainfluenza Virus 4 NOT DETECTED NOT DETECTED Final   Respiratory Syncytial Virus NOT DETECTED NOT DETECTED Final   Bordetella pertussis NOT DETECTED NOT DETECTED Final   Chlamydophila pneumoniae NOT DETECTED NOT DETECTED Final   Mycoplasma pneumoniae NOT DETECTED NOT DETECTED Final    Comment: Performed at [facility], [place] [address], [place], [place] [address]  MRSA PCR Screening     Status: None   Collection Time: 11/08/17 11:00 PM  Result Value Ref Range Status   MRSA by PCR NEGATIVE NEGATIVE Final    Comment:        The GeneXpert MRSA Assay (FDA approved for NASAL specimens only), is one component of a comprehensive MRSA colonization surveillance program. It is not intended to diagnose MRSA infection nor to guide or monitor treatment for MRSA infections. Performed at [facility], [place] [address] [place], [place] [address]       Radiology Studies: Dg Chest Port 1 View  Result Date: 11/12/2017 CLINICAL DATA:  Shortness of breath.  COPD.  Pleural effusion. EXAM: PORTABLE CHEST 1 VIEW COMPARISON:  11/08/2017 plain film and CT. FINDINGS: Right-sided Port-A-Cath terminates at the mid right atrium. Midline trachea. Normal heart size. Atherosclerosis in the transverse aorta. Right-sided pleural effusion is slightly increased. No pneumothorax. Worsened right middle and lower lobe airspace disease. Clear left lung. Underlying hyperinflation. IMPRESSION: Worsened right-sided aeration with increased pleural fluid and mid/lower lung airspace disease. Aortic Atherosclerosis (ICD10-I70.0). Electronically Signed   By: Abigail Miyamoto M.D.   On: 11/12/2017 08:02    Scheduled Meds: . diltiazem  120 mg Oral Daily  . feeding supplement (ENSURE ENLIVE)  237 mL Oral BID BM  . fluticasone  2 spray Each Nare BID  . ipratropium-albuterol  3 mL Nebulization TID  . levothyroxine  137 mcg Oral QAC breakfast  . mirtazapine  15 mg Oral QHS  .  mometasone-formoterol  2 puff Inhalation BID  . polyethylene glycol  17 g Oral Daily  . sodium chloride flush  10-40 mL Intracatheter Q12H   Continuous Infusions: . ceFEPime (MAXIPIME) IV Stopped (11/12/17 1601)  . heparin 1,150 Units/hr (11/12/17 1129)     LOS: 4 days   Time spent: 25 minutes.  Vance Gather, MD [facility] Pager [phone]  If 7PM-7AM, please contact night-coverage [email or web address] Password [email or web address] 11/12/2017, 1:40 PM

## 2017-11-12 NOTE — Progress Notes (Signed)
ANTICOAGULATION CONSULT NOTE - f/u Consult  Pharmacy Consult for Heparin while Xarelto on hold Indication: atrial fibrillation  Allergies  Allergen Reactions  . Codeine Itching    Patient Measurements: Height: 5\' 4"  (162.6 cm) Weight: 164 lb 14.5 oz (74.8 kg) IBW/kg (Calculated) : 54.7 Heparin Dosing Weight: 70.3kg  Vital Signs: Temp: 98.2 F (36.8 C) (02/04 2210) Temp Source: Oral (02/04 2210) BP: 135/78 (02/04 2210) Pulse Rate: 108 (02/04 2210)  Labs: Recent Labs    11/09/17 0722 11/10/17 0349 11/12/17 0050 11/12/17 0051  HGB 9.7* 9.8*  --   --   HCT 28.1* 29.2*  --   --   PLT 91* 93*  --   --   APTT  --   --   --  51*  HEPARINUNFRC  --   --  0.78*  --   CREATININE 0.48 0.69 0.60  --     Estimated Creatinine Clearance: 53.7 mL/min (by C-G formula based on SCr of 0.6 mg/dL).   Medical History: Past Medical History:  Diagnosis Date  . COPD (chronic obstructive pulmonary disease) ([facility])   . Depression   . Diverticulosis   . Family hx of colon cancer   . Fibromyalgia   . GERD (gastroesophageal reflux disease)   . Hemorrhoids   . Hiatal hernia   . History of shingles   . Hypothyroidism   . IBS (irritable bowel syndrome)   . Lymphocytic colitis   . Osteoporosis   . Ovarian cancer ([facility])   . Paroxysmal atrial fibrillation (HCC)   . Psoriatic arthritis ([facility])   . Rectal polyp   . Schatzki's ring   . Uterine polyp     Medications:  Scheduled:  . diltiazem  120 mg Oral Daily  . feeding supplement (ENSURE ENLIVE)  237 mL Oral BID BM  . fluticasone  2 spray Each Nare BID  . ipratropium-albuterol  3 mL Nebulization QID  . levothyroxine  137 mcg Oral QAC breakfast  . mirtazapine  15 mg Oral QHS  . mometasone-formoterol  2 puff Inhalation BID  . polyethylene glycol  17 g Oral Daily  . sodium chloride flush  10-40 mL Intracatheter Q12H   Infusions:  . ceFEPime (MAXIPIME) IV Stopped (11/11/17 2203)  . heparin     PRN: albuterol, ALPRAZolam, ondansetron  (ZOFRAN) IV, oxyCODONE, sodium chloride flush  Assessment: 84 yo female with ovarian cancer currently undergoing chemotherapy treatment and hx afib on xarelto PTA, presented to the ED on 11/08/2017 with c/o tachycardia, cough and congestion. Xarelto is now on hold for thoracentesis planned on Thursday and Pharmacy is consulted to dose IV heparin as bridge therapy.  Last dose of Xarelto was given 2/3 at 17:16 so heparin will not need to start until tonight 24hr after. Today, 2/5 HL=0.78 and aPtt=51 sec - not correlating, HL likely still affected by xarelto. Aptt slighlty below goal.  Goal of Therapy:  Heparin level 0.3-0.7 units/ml Monitor platelets by anticoagulation protocol: Yes  aptt=66-102 sec  Plan:   Increase heparin drip to 1100 units/hr  Recheck aptt in 8 hours  Daily heparin level, CBC (note low platelets s/p chemo)  F/u when to stop heparin prior to thoracentesis on Thursday   Lawana Pai R 11/12/2017,1:32 AM

## 2017-11-12 NOTE — Evaluation (Signed)
Physical Therapy Evaluation Patient Details Name: KARLEE STAFF MRN: [id number] DOB: 1933/07/05 Today's Date: 11/12/2017   History of Present Illness  84 y.o. female with a history of AFib on xarelto and ovarian CA undergoing chemotherapy who presented with dyspnea and productive cough found to have possible RML infiltrate and large pleural effusion, RVP demonstrated coronavirus infection. Antibiotics were started and ultimately pulmonology consulted for ongoing respiratory distress. AFib with RVR was present on admission, since improved.   Clinical Impression  Pt admitted with above diagnosis. Pt currently with functional limitations due to the deficits listed below (see PT Problem List). Pt ambulated 40' with RW, distance limited by 2/4 dyspnea and fatigue. HR 113, SaO2 94% on room air with ambulation. Pt is deconditioned, so ST-SNF recommended. Pt will benefit from skilled PT to increase their independence and safety with mobility to allow discharge to the venue listed below.       Follow Up Recommendations SNF    Equipment Recommendations  None recommended by PT    Recommendations for Other Services       Precautions / Restrictions Precautions Precautions: Fall Precaution Comments: on home O2 prn, denies h/o falls Restrictions Weight Bearing Restrictions: No      Mobility  Bed Mobility               General bed mobility comments: up in recliner  Transfers Overall transfer level: Needs assistance Equipment used: None Transfers: Sit to/from Stand Sit to Stand: Min guard         General transfer comment: VCs hand placement  Ambulation/Gait Ambulation/Gait assistance: Min guard Ambulation Distance (Feet): 40 Feet Assistive device: Rolling walker (2 wheeled) Gait Pattern/deviations: Step-through pattern;Decreased stride length;Trunk flexed   Gait velocity interpretation: at or above normal speed for age/gender General Gait Details: distance limited by 2/4  dyspnea and fatigue, no LOB, HR 113, SaO2 94% on room air  Stairs            Wheelchair Mobility    Modified Rankin (Stroke Patients Only)       Balance Overall balance assessment: Modified Independent                                           Pertinent Vitals/Pain Pain Assessment: 0-10 Pain Score: 4  Pain Location: back Pain Descriptors / Indicators: Aching Pain Intervention(s): Limited activity within patient's tolerance;Monitored during session(pt declined pain meds)    Home Living Family/patient expects to be discharged to:: Private residence Living Arrangements: Alone Available Help at Discharge: Personal care attendant;Family;Available PRN/intermittently(aide comes 5 hours/day M-F; daughter is "in and out")   Home Access: Stairs to enter Entrance Stairs-Rails: Right Entrance Stairs-Number of Steps: 2 Home Layout: One level Home Equipment: Walker - 2 wheels;Bedside commode;Shower seat;Cane - single point;Adaptive equipment      Prior Function Level of Independence: Needs assistance   Gait / Transfers Assistance Needed: walks with RW  ADL's / Homemaking Assistance Needed: aide assists with bathing/dressing        Hand Dominance        Extremity/Trunk Assessment   Upper Extremity Assessment Upper Extremity Assessment: Defer to OT evaluation    Lower Extremity Assessment Lower Extremity Assessment: Overall WFL for tasks assessed    Cervical / Trunk Assessment Cervical / Trunk Assessment: Normal  Communication   Communication: HOH  Cognition Arousal/Alertness: Awake/alert Behavior During Therapy: WFL for tasks assessed/performed  Overall Cognitive Status: Within Functional Limits for tasks assessed                                        General Comments      Exercises     Assessment/Plan    PT Assessment Patient needs continued PT services  PT Problem List Decreased mobility;Decreased activity  tolerance;Cardiopulmonary status limiting activity       PT Treatment Interventions Gait training;DME instruction;Therapeutic activities;Therapeutic exercise;Patient/family education    PT Goals (Current goals can be found in the Care Plan section)  Acute Rehab PT Goals Patient Stated Goal: wants to go to ST-SNF then home PT Goal Formulation: With patient Time For Goal Achievement: 11/26/17 Potential to Achieve Goals: Good    Frequency Min 3X/week   Barriers to discharge        Co-evaluation               AM-PAC PT "6 Clicks" Daily Activity  Outcome Measure Difficulty turning over in bed (including adjusting bedclothes, sheets and blankets)?: A Little Difficulty moving from lying on back to sitting on the side of the bed? : A Little Difficulty sitting down on and standing up from a chair with arms (e.g., wheelchair, bedside commode, etc,.)?: A Little Help needed moving to and from a bed to chair (including a wheelchair)?: A Little Help needed walking in hospital room?: A Little Help needed climbing 3-5 steps with a railing? : A Lot 6 Click Score: 17    End of Session Equipment Utilized During Treatment: Gait belt Activity Tolerance: Patient tolerated treatment well;Treatment limited secondary to medical complications (Comment)(dyspnea) Patient left: in chair;with call bell/phone within reach Nurse Communication: Mobility status PT Visit Diagnosis: Pain;Difficulty in walking, not elsewhere classified (R26.2) Pain - part of body: (back)    Time: 9450-3888 PT Time Calculation (min) (ACUTE ONLY): 16 min   Charges:   PT Evaluation $PT Eval Low Complexity: 1 Low     PT G CodesPhilomena Doheny 11/12/2017, 1:17 PM [phone]

## 2017-11-12 NOTE — Progress Notes (Signed)
ANTICOAGULATION CONSULT NOTE - f/u Consult  Pharmacy Consult for Heparin while rivaroxaban on hold Indication: atrial fibrillation  Allergies  Allergen Reactions  . Codeine Itching    Patient Measurements: Height: 5\' 4"  (162.6 cm) Weight: 164 lb 14.5 oz (74.8 kg) IBW/kg (Calculated) : 54.7 Heparin Dosing Weight: 70.3kg  Vital Signs: Temp: 98 F (36.7 C) (02/05 1334) Temp Source: Oral (02/05 1334) BP: 114/50 (02/05 1334) Pulse Rate: 98 (02/05 1334)  Labs: Recent Labs    11/10/17 0349 11/12/17 0050 11/12/17 0051 11/12/17 0102 11/12/17 1003 11/12/17 2031  HGB 9.8*  --   --  9.3*  --   --   HCT 29.2*  --   --  27.5*  --   --   PLT 93*  --   --  111*  --   --   APTT  --   --  51*  --  69* 66*  HEPARINUNFRC  --  0.78*  --   --   --   --   CREATININE 0.69 0.60  --   --   --   --     Estimated Creatinine Clearance: 53.7 mL/min (by C-G formula based on SCr of 0.6 mg/dL).  PRN: albuterol, ALPRAZolam, ondansetron (ZOFRAN) IV, oxyCODONE, sodium chloride flush  Assessment: 84 yo female with ovarian cancer currently undergoing chemotherapy treatment and hx afib on xarelto PTA, presented to the ED on 11/08/2017 with c/o tachycardia, cough and congestion. Xarelto is now on hold for thoracentesis planned on Thursday and Pharmacy is consulted to dose IV heparin as bridge therapy.  Last dose of Xarelto was given 2/3 at 17:16 so heparin will not need to start until tonight 24hr after.  Today, 11/12/2017  APTT = 66 sec (low end of goal)  Using aPTT to dose heparin gtt until anti-Xa inhibitor (rivaroxaban) is eliminated and not interfering with Xa level  CBC: Hgb low/stalbe, pltc stable/slightly improved   No bleeding noted  Goal of Therapy:  Heparin level 0.3-0.7 units/ml Monitor platelets by anticoagulation protocol: Yes  aptt=66-102 sec  Plan:   Increase heparin drip since at low end goal, increase to 1250 units/hr  Daily heparin level, CBC (note low platelets s/p  chemo)  F/u when to stop heparin prior to thoracentesis on Thursday  Eudelia Bunch, Pharm.D. [phone] 11/12/2017 9:56 PM

## 2017-11-12 NOTE — Progress Notes (Signed)
ANTICOAGULATION CONSULT NOTE - f/u Consult  Pharmacy Consult for Heparin while rivaroxaban on hold Indication: atrial fibrillation  Allergies  Allergen Reactions  . Codeine Itching    Patient Measurements: Height: 5' 4"  (162.6 cm) Weight: 164 lb 14.5 oz (74.8 kg) IBW/kg (Calculated) : 54.7 Heparin Dosing Weight: 70.3kg  Vital Signs: Temp: 98.1 F (36.7 C) (02/05 0601) Temp Source: Oral (02/05 0601) BP: 136/61 (02/05 0814) Pulse Rate: 92 (02/05 0601)  Labs: Recent Labs    11/10/17 0349 11/12/17 0050 11/12/17 0051 11/12/17 0102 11/12/17 1003  HGB 9.8*  --   --  9.3*  --   HCT 29.2*  --   --  27.5*  --   PLT 93*  --   --  111*  --   APTT  --   --  51*  --  69*  HEPARINUNFRC  --  0.78*  --   --   --   CREATININE 0.69 0.60  --   --   --     Estimated Creatinine Clearance: 53.7 mL/min (by C-G formula based on SCr of 0.6 mg/dL).  PRN: albuterol, ALPRAZolam, ondansetron (ZOFRAN) IV, oxyCODONE, sodium chloride flush  Assessment: 84 yo female with ovarian cancer currently undergoing chemotherapy treatment and hx afib on xarelto PTA, presented to the ED on 11/08/2017 with c/o tachycardia, cough and congestion. Xarelto is now on hold for thoracentesis planned on Thursday and Pharmacy is consulted to dose IV heparin as bridge therapy.  Last dose of Xarelto was given 2/3 at 17:16 so heparin will not need to start until tonight 24hr after.  Today, 11/12/2017  APTT = 69 sec (low end of goal)  Using aPTT to dose heparin gtt until anti-Xa inhibitor (rivaroxaban) is eliminated and not interfering with Xa level  CBC: Hgb low/stalbe, pltc stable/slightly improved   No bleeding noted  Goal of Therapy:  Heparin level 0.3-0.7 units/ml Monitor platelets by anticoagulation protocol: Yes  aptt=66-102 sec  Plan:   Increase heparin drip slightly since at low end goal, increase to 1150 units/hr  Recheck aptt in 8 hours  Daily heparin level, CBC (note low platelets s/p chemo)  F/u  when to stop heparin prior to thoracentesis on Thursday  Doreene Eland, PharmD, BCPS.   Pager: [phone] 11/12/2017 11:21 AM

## 2017-11-13 ENCOUNTER — Encounter (HOSPITAL_COMMUNITY): Payer: Self-pay | Admitting: Physical Therapy

## 2017-11-13 ENCOUNTER — Encounter (HOSPITAL_COMMUNITY): Payer: Self-pay | Admitting: Occupational Therapy

## 2017-11-13 DIAGNOSIS — J431 Panlobular emphysema: Secondary | ICD-10-CM

## 2017-11-13 DIAGNOSIS — C569 Malignant neoplasm of unspecified ovary: Secondary | ICD-10-CM

## 2017-11-13 DIAGNOSIS — D709 Neutropenia, unspecified: Secondary | ICD-10-CM

## 2017-11-13 LAB — CBC WITH DIFFERENTIAL/PLATELET
BASOS ABS: 0 10*3/uL (ref 0.0–0.1)
BASOS PCT: 1 %
EOS PCT: 0 %
Eosinophils Absolute: 0 10*3/uL (ref 0.0–0.7)
HEMATOCRIT: 30 % — AB (ref 36.0–46.0)
HEMOGLOBIN: 10 g/dL — AB (ref 12.0–15.0)
LYMPHS ABS: 1.2 10*3/uL (ref 0.7–4.0)
LYMPHS PCT: 56 %
MCH: 32.4 pg (ref 26.0–34.0)
MCHC: 33.3 g/dL (ref 30.0–36.0)
MCV: 97.1 fL (ref 78.0–100.0)
MONOS PCT: 22 %
Monocytes Absolute: 0.5 10*3/uL (ref 0.1–1.0)
NEUTROS ABS: 0.5 10*3/uL — AB (ref 1.7–7.7)
Neutrophils Relative %: 21 %
Platelets: 106 10*3/uL — ABNORMAL LOW (ref 150–400)
RBC: 3.09 MIL/uL — ABNORMAL LOW (ref 3.87–5.11)
RDW: 14.3 % (ref 11.5–15.5)
WBC: 2.2 10*3/uL — ABNORMAL LOW (ref 4.0–10.5)

## 2017-11-13 LAB — PATHOLOGIST SMEAR REVIEW

## 2017-11-13 LAB — BASIC METABOLIC PANEL
ANION GAP: 7 (ref 5–15)
BUN: 18 mg/dL (ref 6–20)
CHLORIDE: 97 mmol/L — AB (ref 101–111)
CO2: 31 mmol/L (ref 22–32)
Calcium: 8.9 mg/dL (ref 8.9–10.3)
Creatinine, Ser: 0.64 mg/dL (ref 0.44–1.00)
GFR calc Af Amer: >60 mL/min (ref >60)
GLUCOSE: 83 mg/dL (ref 65–99)
POTASSIUM: 4.3 mmol/L (ref 3.5–5.1)
Sodium: 135 mmol/L (ref 135–145)

## 2017-11-13 LAB — CULTURE, BLOOD (ROUTINE X 2)
Culture: NO GROWTH
Special Requests: ADEQUATE

## 2017-11-13 LAB — HEPARIN LEVEL (UNFRACTIONATED): HEPARIN UNFRACTIONATED: 0.92 [IU]/mL — AB (ref 0.30–0.70)

## 2017-11-13 LAB — APTT
APTT: 109 s — AB (ref 24–36)
aPTT: 86 seconds — ABNORMAL HIGH (ref 24–36)

## 2017-11-13 MED ORDER — SENNA 8.6 MG PO TABS
2.0000 | ORAL_TABLET | Freq: Every day | ORAL | Status: DC
  Administered 2017-11-13 – 2017-11-14 (×2): 17.2 mg via ORAL
  Filled 2017-11-13 (×2): qty 2

## 2017-11-13 MED ORDER — HEPARIN (PORCINE) IN NACL 100-0.45 UNIT/ML-% IJ SOLN
1150.0000 [IU]/h | INTRAMUSCULAR | Status: DC
Start: 2017-11-13 — End: 2017-11-14
  Filled 2017-11-13: qty 250

## 2017-11-13 MED ORDER — TBO-FILGRASTIM 300 MCG/0.5ML ~~LOC~~ SOSY
300.0000 ug | PREFILLED_SYRINGE | Freq: Once | SUBCUTANEOUS | Status: AC
  Administered 2017-11-13: 300 ug via SUBCUTANEOUS
  Filled 2017-11-13: qty 0.5

## 2017-11-13 NOTE — NC FL2 (Signed)
[place] LEVEL OF CARE SCREENING TOOL     IDENTIFICATION  Patient Name: [name] Birthdate: 07/29/1933 Sex: female Admission Date (Current Location): 11/08/2017  [facility] and [place] Number:  Engineer, manufacturing systems and Address:  [facility],  [place] [address] [place]      Provider Number: [id number]  Attending Physician Name and Address:  Cristy Folks, MD  Relative Name and Phone Number:       Current Level of Care: Hospital Recommended Level of Care: [place] Prior Approval Number:    Date Approved/Denied:   PASRR Number: [id number]  Discharge Plan: SNF    Current Diagnoses: Patient Active Problem List   Diagnosis Date Noted  . Atrial fibrillation with RVR ([place]) 11/08/2017  . HCAP (healthcare-associated pneumonia) 11/08/2017  . Pleural effusion on right 09/20/2017  . Acute on chronic diastolic heart failure ([place]) 08/19/2017  . Pancytopenia, acquired ([place]) 08/15/2017  . Protein-calorie malnutrition, moderate ([place]) 07/30/2017  . Generalized weakness 07/30/2017  . Antineoplastic chemotherapy induced pancytopenia ([place]) 07/20/2017  . Ovarian CA, right ([place]) 07/18/2017  . PNA (pneumonia) 07/07/2017  . Ascites 06/30/2017  . Ascites, malignant 06/30/2017  . Right ovarian cyst 01/18/2017  . Elevated CA-125 01/18/2017  . Depression 12/23/2015  . Peripheral edema 08/23/2015  . Fracture of hip, left, closed ([place]) 12/08/2013  . Hip fracture ([place]) 12/08/2013  . PAD (peripheral artery disease) ([place]) 11/25/2013  . Encounter for therapeutic drug monitoring 11/09/2013  . Atrial fibrillation ([place]) 05/07/2013  . Cough 03/20/2013  . Total knee replacement status 10/07/2012  . Knee pain 10/07/2012  . Knee stiffness 10/07/2012  . Tachycardia 09/17/2012  . Chest pain 09/17/2012  . Difficulty in walking(719.7) 09/16/2012  . Muscle weakness (generalized) 09/16/2012  . Postop Acute blood loss anemia 08/08/2012  .  Instability of prosthetic knee ([place]) 08/06/2012  . Bloating 02/14/2012  . Upper abdominal pain 02/14/2012  . Allergic rhinitis, seasonal 02/15/2011  . COLITIS 02/20/2010  . Diarrhea 01/02/2010  . ABDOMINAL PAIN -GENERALIZED 01/02/2010  . PERSONAL HX COLONIC POLYPS 01/02/2010  . HYPOTHYROIDISM 12/28/2009  . COPD (chronic obstructive pulmonary disease) ([place]) 12/28/2009  . ARTHRITIS 12/28/2009    Orientation RESPIRATION BLADDER Height & Weight     Self, Situation, Place, Time  O2 Continent Weight: 164 lb 14.5 oz (74.8 kg) Height:  5\' 4"  (162.6 cm)  BEHAVIORAL SYMPTOMS/MOOD NEUROLOGICAL BOWEL NUTRITION STATUS      Continent Diet(see dc summary)  AMBULATORY STATUS COMMUNICATION OF NEEDS Skin   Limited Assist Verbally Normal                       Personal Care Assistance Level of Assistance  Bathing, Feeding, Dressing Bathing Assistance: Maximum assistance Feeding assistance: Independent Dressing Assistance: Maximum assistance     Functional Limitations Info  Sight, Hearing, Speech Sight Info: Adequate Hearing Info: Adequate Speech Info: Adequate    SPECIAL CARE FACTORS FREQUENCY  PT (By licensed PT), OT (By licensed OT)     PT Frequency: 5x/week OT Frequency: 5x/week            Contractures Contractures Info: Not present    Additional Factors Info  Code Status, Allergies Code Status Info: Full Code  Allergies Info: Codeine           Current Medications (11/13/2017):  This is the current hospital active medication list Current Facility-Administered Medications  Medication Dose Route Frequency Provider Last Rate Last Dose  . albuterol (PROVENTIL) (2.5  MG/3ML) 0.083% nebulizer solution 2.5 mg  2.5 mg Nebulization Q6H PRN Rise Patience, MD      . ALPRAZolam Duanne Moron) tablet 0.25 mg  0.25 mg Oral QHS PRN Reyne Dumas, MD   0.25 mg at 11/12/17 2342  . diltiazem (CARDIZEM CD) 24 hr capsule 120 mg  120 mg Oral Daily Patrecia Pour, MD   120 mg at 11/13/17  0958  . feeding supplement (ENSURE ENLIVE) (ENSURE ENLIVE) liquid 237 mL  237 mL Oral BID BM Rise Patience, MD   237 mL at 11/12/17 1417  . fluticasone (FLONASE) 50 MCG/ACT nasal spray 2 spray  2 spray Each Nare BID Reyne Dumas, MD   2 spray at 11/12/17 0816  . heparin ADULT infusion 100 units/mL (25000 units/254mL sodium chloride 0.45%)  1,150 Units/hr Intravenous Continuous Berton Mount, RPH 11.5 mL/hr at 11/13/17 0747 1,150 Units/hr at 11/13/17 0747  . ipratropium-albuterol (DUONEB) 0.5-2.5 (3) MG/3ML nebulizer solution 3 mL  3 mL Nebulization TID Patrecia Pour, MD   3 mL at 11/13/17 0816  . levothyroxine (SYNTHROID, LEVOTHROID) tablet 137 mcg  137 mcg Oral QAC breakfast Reyne Dumas, MD   137 mcg at 11/13/17 0748  . mirtazapine (REMERON) tablet 15 mg  15 mg Oral QHS Reyne Dumas, MD   15 mg at 11/12/17 2342  . mometasone-formoterol (DULERA) 200-5 MCG/ACT inhaler 2 puff  2 puff Inhalation BID Reyne Dumas, MD   2 puff at 11/13/17 0824  . ondansetron (ZOFRAN) injection 4 mg  4 mg Intravenous Q6H PRN Abrol, Ascencion Dike, MD      . oxyCODONE (Oxy IR/ROXICODONE) immediate release tablet 5-10 mg  5-10 mg Oral Q4H PRN Elwyn Reach, MD   10 mg at 11/12/17 2025  . polyethylene glycol (MIRALAX / GLYCOLAX) packet 17 g  17 g Oral Daily Alvy Bimler, Ni, MD   17 g at 11/13/17 0947  . senna (SENOKOT) tablet 17.2 mg  2 tablet Oral QHS Gorsuch, Ni, MD      . sodium chloride flush (NS) 0.9 % injection 10-40 mL  10-40 mL Intracatheter Q12H Roney Jaffe, MD   10 mL at 11/11/17 2134  . sodium chloride flush (NS) 0.9 % injection 10-40 mL  10-40 mL Intracatheter PRN Roney Jaffe, MD      . Tbo-Filgrastim [facility]) injection 300 mcg  300 mcg Subcutaneous ONCE-1800 Heath Lark, MD         Discharge Medications: Please see discharge summary for a list of discharge medications.  Relevant Imaging Results:  Relevant Lab Results:   Additional Information SS# [id number]. Pt has chemotherapy  appointments at [facility], LCSW

## 2017-11-13 NOTE — Progress Notes (Signed)
PROGRESS NOTE    JENNESIS RAMASWAMY  WEX:[id number] DOB: 1932-11-05 DOA: 11/08/2017 PCP: Asencion Noble, MD   Brief Narrative: Ms. Windish is a 84 year old woman with a past medical history relevant for hypothyroidism, atrial fibrillation on anticoagulation, diastolic heart failure, COPD, ovarian cancer status post 6 cycles of platinum based chemotherapy who was admitted with weakness, diarrhea, dyspnea and cough and found to have atrial fibrillation with RVR likely secondary to right lower lobe infiltrate and worsening pleural effusion. She is doing better today and is looking forward to thoracentesis tomorrow.   Assessment & Plan:   Principal Problem:   PNA (pneumonia) Active Problems:   COPD (chronic obstructive pulmonary disease) (HCC)   Atrial fibrillation (HCC)   Atrial fibrillation with RVR (HCC)   HCAP (healthcare-associated pneumonia)   ##) Community-acquired pneumonia likely secondary to Streptococcus pneumonia: She is at high risk for unusual organisms secondary to the recent chemotherapy. -Continue cefepime -Blood cultures from admission are negative  ##) Pleural effusion: Noted to have pleural effusion on the right for some time.  Unclear etiology however the differential includes parapneumonic, malignant, transudative from diastolic heart failure.  Low suspicion for diastolic heart failure as her volume status appears to be euvolemic to slightly low at this time. -Hold oral anticoagulation at this time -Continue heparin drip -Pulmonology consulted, will plan for thoracentesis tomorrow  ##) Persistent atrial fibrillation with rapid ventricular response: Patient is improved and is now off the diltiazem drip -Continue extended release diltiazem 120 mg daily -Heparin drip per above, after thoracentesis will transition to home novel oral anticoagulant  ##) COPD: - Continue to nebs 3 times daily and as needed -Continue Labe/ICS 2 puffs twice a day  ##) Ovarian  cancer: -Continue recombinant filgastrim - Continue oxycodone for pain, alprazolam for anxiety -Continue MiraLAX and Senokot for constipation  ##) Hypothyroidism: -Continue levothyroxine 137 mcg daily  FEN: - fluids: tolerating PO - electrolytes: monitor and supp - nutrition: regular diet  Prophy: heparin gtt  Dispo: pending thoracentesis  FULL CODE  Consultants:   Pulmonology  Oncology  Procedures: CTA 11/08/2017 IMPRESSION: Airspace consolidation versus right medial segmental atelectasis of the right middle lobe.  Antimicrobials:   Cefepime started 11/08/2017   Subjective: Today patient reports feeling better.  She reports that her breathing is improved somewhat.  She denies any fevers cough congestion runny nose nausea vomiting diarrhea.  Objective: Vitals:   11/12/17 2018 11/12/17 2140 11/13/17 0505 11/13/17 0816  BP:  (!) 118/49 (!) 108/59   Pulse:  99 95   Resp:  18 18   Temp:  97.9 F (36.6 C) 97.7 F (36.5 C)   TempSrc:  Oral Oral   SpO2: 94% 96% 99% 93%  Weight:      Height:        Intake/Output Summary (Last 24 hours) at 11/13/2017 1213 Last data filed at 11/12/2017 1900 Gross per 24 hour  Intake 484.94 ml  Output 500 ml  Net -15.06 ml   Filed Weights   11/08/17 2254  Weight: 74.8 kg (164 lb 14.5 oz)    Examination:  General exam: Appears calm and comfortable  Respiratory system: No lung sounds heard at right base of the right middle lobe, scattered crackles, diminished breath sounds on left base, no rhonchi no wheezes Cardiovascular system: Irregularly irregular, no murmurs rubs or gallops Gastrointestinal system: Abdomen is nondistended, soft and nontender. No organomegaly or masses felt. Normal bowel sounds heard. Central nervous system: Alert and oriented. No focal neurological deficits. Extremities: No  lower extremity edema Skin: Port site is clean dry and intact  psychiatry: Judgement and insight appear normal. Mood & affect  appropriate.     Data Reviewed: I have personally reviewed following labs and imaging studies  CBC: Recent Labs  Lab 11/08/17 1005 11/09/17 0722 11/10/17 0349 11/12/17 0102 11/13/17 0551  WBC 21.4* 9.2 3.8* 1.6* 2.2*  NEUTROABS 20.1*  --   --  0.7* 0.5*  HGB 11.0* 9.7* 9.8* 9.3* 10.0*  HCT 32.7* 28.1* 29.2* 27.5* 30.0*  MCV 96.7 96.9 97.3 96.5 97.1  PLT 121* 91* 93* 111* 242*   Basic Metabolic Panel: Recent Labs  Lab 11/08/17 1005 11/08/17 1649 11/09/17 0722 11/10/17 0349 11/12/17 0050 11/13/17 0551  NA 138 138 136 134* 134* 135  K 3.0* 3.9 3.9 5.1 3.7 4.3  CL 97* 104 101 98* 95* 97*  CO2 32 28 29 29 30 31   GLUCOSE 132* 138* 108* 157* 158* 83  BUN 22* 18 14 18 19 18   CREATININE 0.64 0.43* 0.48 0.69 0.60 0.64  CALCIUM 8.8* 8.1* 8.4* 8.8* 8.7* 8.9  MG 1.5*  --   --   --  1.8  --    GFR: Estimated Creatinine Clearance: 53.7 mL/min (by C-G formula based on SCr of 0.64 mg/dL). Liver Function Tests: Recent Labs  Lab 11/09/17 0722  AST 18  ALT 19  ALKPHOS 50  BILITOT 0.7  PROT 5.8*  ALBUMIN 3.0*   No results for input(s): LIPASE, AMYLASE in the last 168 hours. No results for input(s): AMMONIA in the last 168 hours. Coagulation Profile: No results for input(s): INR, PROTIME in the last 168 hours. Cardiac Enzymes: No results for input(s): CKTOTAL, CKMB, CKMBINDEX, TROPONINI in the last 168 hours. BNP (last 3 results) Recent Labs    08/12/17 1137  PROBNP 837*   HbA1C: No results for input(s): HGBA1C in the last 72 hours. CBG: No results for input(s): GLUCAP in the last 168 hours. Lipid Profile: No results for input(s): CHOL, HDL, LDLCALC, TRIG, CHOLHDL, LDLDIRECT in the last 72 hours. Thyroid Function Tests: No results for input(s): TSH, T4TOTAL, FREET4, T3FREE, THYROIDAB in the last 72 hours. Anemia Panel: No results for input(s): VITAMINB12, FOLATE, FERRITIN, TIBC, IRON, RETICCTPCT in the last 72 hours. Sepsis Labs: Recent Labs  Lab 11/08/17 1005  11/08/17 1121 11/08/17 1356 11/09/17 0722 11/12/17 0050  PROCALCITON 0.31  --   --  0.16 <0.10  LATICACIDVEN  --  1.94* 0.72  --   --     Recent Results (from the past 240 hour(s))  Blood Culture (routine x 2)     Status: None (Preliminary result)   Collection Time: 11/08/17 11:15 AM  Result Value Ref Range Status   Specimen Description   Final    BLOOD LEFT ANTECUBITAL Performed at [facility], [place] [address], [place], [place] [address]    Special Requests   Final    BOTTLES DRAWN AEROBIC AND ANAEROBIC Blood Culture adequate volume Performed at [place] [address], [place], [place] [address]    Culture   Final    NO GROWTH 4 DAYS Performed at [facility], [place] [address] [place], [place] [address]    Report Status PENDING  Incomplete  Blood Culture (routine x 2)     Status: None (Preliminary result)   Collection Time: 11/08/17 10:39 PM  Result Value Ref Range Status   Specimen Description   Final    BLOOD RIGHT ARM Performed at [facility]  [place] [address], [place], [place] [address]    Special Requests   Final    IN PEDIATRIC BOTTLE Blood Culture adequate volume Performed at [place] [address], [place], [place] [address]    Culture   Final    NO GROWTH 3 DAYS Performed at [facility], [place] [address] [place], [place] [address]    Report Status PENDING  Incomplete  Respiratory Panel by PCR     Status: Abnormal   Collection Time: 11/08/17 11:00 PM  Result Value Ref Range Status   Adenovirus NOT DETECTED NOT DETECTED Final   Coronavirus 229E NOT DETECTED NOT DETECTED Final   Coronavirus HKU1 NOT DETECTED NOT DETECTED Final   Coronavirus NL63 NOT DETECTED NOT DETECTED Final   Coronavirus OC43 DETECTED (A) NOT DETECTED Final   Metapneumovirus NOT DETECTED NOT DETECTED Final   Rhinovirus / Enterovirus NOT DETECTED NOT DETECTED Final   Influenza A NOT DETECTED  NOT DETECTED Final   Influenza B NOT DETECTED NOT DETECTED Final   Parainfluenza Virus 1 NOT DETECTED NOT DETECTED Final   Parainfluenza Virus 2 NOT DETECTED NOT DETECTED Final   Parainfluenza Virus 3 NOT DETECTED NOT DETECTED Final   Parainfluenza Virus 4 NOT DETECTED NOT DETECTED Final   Respiratory Syncytial Virus NOT DETECTED NOT DETECTED Final   Bordetella pertussis NOT DETECTED NOT DETECTED Final   Chlamydophila pneumoniae NOT DETECTED NOT DETECTED Final   Mycoplasma pneumoniae NOT DETECTED NOT DETECTED Final    Comment: Performed at [facility], [place] [address] [place], [place] [address]  MRSA PCR Screening     Status: None   Collection Time: 11/08/17 11:00 PM  Result Value Ref Range Status   MRSA by PCR NEGATIVE NEGATIVE Final    Comment:        The GeneXpert MRSA Assay (FDA approved for NASAL specimens only), is one component of a comprehensive MRSA colonization surveillance program. It is not intended to diagnose MRSA infection nor to guide or monitor treatment for MRSA infections. Performed at [facility], [place] [address] [place], [place] [address]          Radiology Studies: Dg Chest Port 1 View  Result Date: 11/12/2017 CLINICAL DATA:  Shortness of breath.  COPD.  Pleural effusion. EXAM: PORTABLE CHEST 1 VIEW COMPARISON:  11/08/2017 plain film and CT. FINDINGS: Right-sided Port-A-Cath terminates at the mid right atrium. Midline trachea. Normal heart size. Atherosclerosis in the transverse aorta. Right-sided pleural effusion is slightly increased. No pneumothorax. Worsened right middle and lower lobe airspace disease. Clear left lung. Underlying hyperinflation. IMPRESSION: Worsened right-sided aeration with increased pleural fluid and mid/lower lung airspace disease. Aortic Atherosclerosis (ICD10-I70.0). Electronically Signed   By: Abigail Miyamoto M.D.   On: 11/12/2017 08:02        Scheduled Meds: . diltiazem  120 mg Oral Daily  . feeding  supplement (ENSURE ENLIVE)  237 mL Oral BID BM  . fluticasone  2 spray Each Nare BID  . ipratropium-albuterol  3 mL Nebulization TID  . levothyroxine  137 mcg Oral QAC breakfast  . mirtazapine  15 mg Oral QHS  . mometasone-formoterol  2 puff Inhalation BID  . polyethylene glycol  17 g Oral Daily  . senna  2 tablet Oral QHS  . sodium chloride flush  10-40 mL Intracatheter Q12H  . Tbo-filgastrim (GRANIX) SQ  300 mcg Subcutaneous ONCE-1800   Continuous Infusions: . heparin 1,150 Units/hr (11/13/17 0747)  LOS: 5 days    Time spent: [place], MD [facility]   If 7PM-7AM, please contact night-coverage [email or web address] Password [email or web address] 11/13/2017, 12:13 PM

## 2017-11-13 NOTE — Progress Notes (Signed)
ANTICOAGULATION CONSULT NOTE - f/u Consult  Pharmacy Consult for Heparin while rivaroxaban on hold Indication: atrial fibrillation  Allergies  Allergen Reactions  . Codeine Itching    Patient Measurements: Height: 5\' 4"  (162.6 cm) Weight: 164 lb 14.5 oz (74.8 kg) IBW/kg (Calculated) : 54.7 Heparin Dosing Weight: 70.3kg  Vital Signs: Temp: 97.7 F (36.5 C) (02/06 1500) Temp Source: Oral (02/06 1500) BP: 128/58 (02/06 1500) Pulse Rate: 97 (02/06 1500)  Labs: Recent Labs    11/12/17 0050  11/12/17 0102  11/12/17 2031 11/13/17 0551 11/13/17 1634  HGB  --   --  9.3*  --   --  10.0*  --   HCT  --   --  27.5*  --   --  30.0*  --   PLT  --   --  111*  --   --  106*  --   APTT  --    < >  --    < > 66* 109* 86*  HEPARINUNFRC 0.78*  --   --   --   --  0.92*  --   CREATININE 0.60  --   --   --   --  0.64  --    < > = values in this interval not displayed.    Estimated Creatinine Clearance: 53.7 mL/min (by C-G formula based on SCr of 0.64 mg/dL).  PRN: albuterol, ALPRAZolam, ondansetron (ZOFRAN) IV, oxyCODONE, sodium chloride flush  Assessment: 84 yo female with ovarian cancer currently undergoing chemotherapy treatment and hx afib on xarelto PTA, presented to the ED on 11/08/2017 with c/o tachycardia, cough and congestion. Xarelto is now on hold for thoracentesis planned on Thursday and Pharmacy is consulted to dose IV heparin as bridge therapy.  Last dose of Xarelto was given 2/3 at 17:16 so heparin will not need to start until tonight 24hr after.  Today, 11/13/2017  APTT = 109 sec (SUPRA therapeutic).  Heparin level also elevated but still appears rivaroxaban may be effecting as it is not correlating with aPTT  Using aPTT to dose heparin gtt until anti-Xa inhibitor (rivaroxaban) is eliminated and not interfering with Xa level  CBC: Hgb low/impoved, pltc decreased but stable  No bleeding noted  APTT at 1634 is 86 sec, therapeutic. No bleeding or line issues per RN  Goal  of Therapy:  Heparin level 0.3-0.7 units/ml Monitor platelets by anticoagulation protocol: Yes  aptt=66-102 sec   Plan:   Continue heparin drip at  1150 units/hr  Daily heparin level, CBC, aPTT (note low platelets s/p chemo)  F/u when to stop heparin prior to thoracentesis on Thursday - PCCM to evaluate their schedule 2/7am and decide on time then place order on when to hold heparin gtt   Royetta Asal, PharmD, BCPS Pager [phone] 11/13/2017 5:50 PM

## 2017-11-13 NOTE — Plan of Care (Signed)
VSS, medicated for pain x 1.  Patient signed consent for thoracentesis, thoracentesis tray in room for bedside procedure 11/14/17.  Medicated for generalized pain x 1 this shift with improvement.  Daughter at bedside part of shift.  Patient did sit up in chair for several hours.

## 2017-11-13 NOTE — Progress Notes (Signed)
Physical Therapy Treatment Patient Details Name: [name] MRN: [id number] DOB: [birth year] Today's Date: 11/13/2017    History of Present Illness 84 y.o. female with a history of AFib on xarelto and ovarian CA undergoing chemotherapy who presented with dyspnea and productive cough found to have possible RML infiltrate and large pleural effusion, RVP demonstrated coronavirus infection. Antibiotics were started and ultimately pulmonology consulted for ongoing respiratory distress. AFib with RVR was present on admission, since improved.     PT Comments    Patient received sitting at EOB with nurse present, reporting she really does not feel good today but is willing to attempt to participate in PT this afternoon. Started with seated LE exercises, then proceeded with ambulation in the room with RW, patient self-limiting today and did not achieve distance performed last session. She was left up in the chair and with all needs met, RN aware of status this afternoon.     Follow Up Recommendations  SNF     Equipment Recommendations  None recommended by PT    Recommendations for Other Services       Precautions / Restrictions Precautions Precautions: Fall Precaution Comments: on home O2 prn, denies h/o falls Restrictions Weight Bearing Restrictions: No    Mobility  Bed Mobility               General bed mobility comments: DNT sitting at EOB with nurse   Transfers Overall transfer level: Needs assistance Equipment used: Rolling walker (2 wheeled) Transfers: Sit to/from Stand Sit to Stand: Min guard         General transfer comment: VCs hand placement  Ambulation/Gait Ambulation/Gait assistance: Min guard Ambulation Distance (Feet): 20 Feet Assistive device: Rolling walker (2 wheeled) Gait Pattern/deviations: Step-through pattern;Decreased stride length;Trunk flexed     General Gait Details: distance self-limited today due to fatigue and malaise    Stairs            Wheelchair Mobility    Modified Rankin (Stroke Patients Only)       Balance Overall balance assessment: Modified Independent                                          Cognition Arousal/Alertness: Awake/alert Behavior During Therapy: WFL for tasks assessed/performed;Impulsive Overall Cognitive Status: Within Functional Limits for tasks assessed                                 General Comments: mildly impulsive, cues for safety during session       Exercises General Exercises - Lower Extremity Ankle Circles/Pumps: Both;Seated;10 reps Long Arc Quad: Both;10 reps;Seated Hip ABduction/ADduction: Both;10 reps;Seated Hip Flexion/Marching: Both;10 reps;Seated    General Comments        Pertinent Vitals/Pain Pain Assessment: No/denies pain Pain Score: 0-No pain Pain Location: "I just feet bad today", no reports of pain  Pain Descriptors / Indicators: Aching Pain Intervention(s): Limited activity within patient's tolerance;Monitored during session    Home Living                      Prior Function            PT Goals (current goals can now be found in the care plan section) Acute Rehab PT Goals Patient Stated Goal: wants to go to ST-SNF then home PT  Goal Formulation: With patient Time For Goal Achievement: 11/26/17 Potential to Achieve Goals: Good Progress towards PT goals: Progressing toward goals    Frequency    Min 3X/week      PT Plan Current plan remains appropriate    Co-evaluation              AM-PAC PT "6 Clicks" Daily Activity  Outcome Measure  Difficulty turning over in bed (including adjusting bedclothes, sheets and blankets)?: Unable Difficulty moving from lying on back to sitting on the side of the bed? : Unable Difficulty sitting down on and standing up from a chair with arms (e.g., wheelchair, bedside commode, etc,.)?: Unable Help needed moving to and from a bed to chair (including a  wheelchair)?: A Little Help needed walking in hospital room?: A Little Help needed climbing 3-5 steps with a railing? : A Lot 6 Click Score: 11    End of Session Equipment Utilized During Treatment: Gait belt;Oxygen Activity Tolerance: Patient tolerated treatment well;Patient limited by fatigue(self-limiting this session, states "I just feel bad today" ) Patient left: in chair;with call bell/phone within reach   PT Visit Diagnosis: Pain;Difficulty in walking, not elsewhere classified (R26.2) Pain - part of body: (back )     Time: 8307-4600 PT Time Calculation (min) (ACUTE ONLY): 23 min  Charges:  $Gait Training: 8-22 mins $Therapeutic Exercise: 8-22 mins                    G Codes:       Deniece Ree PT, DPT, [place]   Pager [phone]

## 2017-11-13 NOTE — Progress Notes (Signed)
[name]   DOB:[birth year]   DJ#:[id number]    Subjective: She is still complaining of shortness of breath and weakness.  She had no bowel movement for 4 days.  She denies pain  Objective:  Vitals:   11/13/17 0505 11/13/17 0816  BP: (!) 108/59   Pulse: 95   Resp: 18   Temp: 97.7 F (36.5 C)   SpO2: 99% 93%     Intake/Output Summary (Last 24 hours) at 11/13/2017 1230 Last data filed at 11/12/2017 1900 Gross per 24 hour  Intake 484.94 ml  Output 500 ml  Net -15.06 ml    GENERAL:alert, no distress and comfortable.  She has oxygen via nasal cannula SKIN: skin color, texture, turgor are normal, no rashes or significant lesions EYES: normal, Conjunctiva are pink and non-injected, sclera clear OROPHARYNX:no exudate, no erythema and lips, buccal mucosa, and tongue normal  NECK: supple, thyroid normal size, non-tender, without nodularity LYMPH:  no palpable lymphadenopathy in the cervical, axillary or inguinal LUNGS: Reduced breath sounds over the left lung  hEART: regular rate & rhythm and no murmurs and no lower extremity edema ABDOMEN:abdomen soft, non-tender and normal bowel sounds Musculoskeletal:no cyanosis of digits and no clubbing  NEURO: alert & oriented x 3 with fluent speech, no focal motor/sensory deficits   Labs:  Lab Results  Component Value Date   WBC 2.2 (L) 11/13/2017   HGB 10.0 (L) 11/13/2017   HCT 30.0 (L) 11/13/2017   MCV 97.1 11/13/2017   PLT 106 (L) 11/13/2017   NEUTROABS 0.5 (L) 11/13/2017    Lab Results  Component Value Date   NA 135 11/13/2017   K 4.3 11/13/2017   CL 97 (L) 11/13/2017   CO2 31 11/13/2017    Studies:  Dg Chest Port 1 View  Result Date: 11/12/2017 CLINICAL DATA:  Shortness of breath.  COPD.  Pleural effusion. EXAM: PORTABLE CHEST 1 VIEW COMPARISON:  11/08/2017 plain film and CT. FINDINGS: Right-sided Port-A-Cath terminates at the mid right atrium. Midline trachea. Normal heart size. Atherosclerosis in the transverse aorta.  Right-sided pleural effusion is slightly increased. No pneumothorax. Worsened right middle and lower lobe airspace disease. Clear left lung. Underlying hyperinflation. IMPRESSION: Worsened right-sided aeration with increased pleural fluid and mid/lower lung airspace disease. Aortic Atherosclerosis (ICD10-I70.0). Electronically Signed   By: Abigail Miyamoto M.D.   On: 11/12/2017 08:02    Assessment & Plan:   Ovarian CA, right (HCC) We had a long discussion about plan of care She is very frail She has recentlycompleted 6cyclesof treatment last week Continue supportive care  Pancytopenia, acquired ([place]) She is frail She has lost a lot of weight She does not need blood transfusion There is no contraindication to remain on antiplatelet agents or anticoagulants as long as the platelet is greater than 50,000. Her neutropenia is due to recent chemotherapy.  I recommend G-CSF support and she agreed to proceed.  G-CSF was started on 11/12/2017 with improvement.  I would give her another dose today.  We will continue to monitor her blood counts carefully  COPD (chronic obstructive pulmonary disease) (HCC)exacerbation with healthcare associatedpneumonia Continue IV antibiotics, oxygen and nebulizers as needed  Generalized weakness Appreciate PT consultation The patient may benefit from skilled nursing facility upon discharge  Pleural effusion on right She has worsening pleural effusion. Agree with thoracentesis  Fluid overload, SOB The patient had recent fluid overload requiring diuretic therapy BNP is within normal limits  Severe constipation Continue laxatives  Discharge planning She is very weak She  might benefit from skilled nursing facility placement upon discharge  Heath Lark, MD 11/13/2017  12:30 PM

## 2017-11-13 NOTE — Progress Notes (Signed)
ANTICOAGULATION CONSULT NOTE - f/u Consult  Pharmacy Consult for Heparin while rivaroxaban on hold Indication: atrial fibrillation  Allergies  Allergen Reactions  . Codeine Itching    Patient Measurements: Height: 5' 4"  (162.6 cm) Weight: 164 lb 14.5 oz (74.8 kg) IBW/kg (Calculated) : 54.7 Heparin Dosing Weight: 70.3kg  Vital Signs: Temp: 97.7 F (36.5 C) (02/06 0505) Temp Source: Oral (02/06 0505) BP: 108/59 (02/06 0505) Pulse Rate: 95 (02/06 0505)  Labs: Recent Labs    11/12/17 0050  11/12/17 0102 11/12/17 1003 11/12/17 2031 11/13/17 0551  HGB  --   --  9.3*  --   --  10.0*  HCT  --   --  27.5*  --   --  30.0*  PLT  --   --  111*  --   --  106*  APTT  --    < >  --  69* 66* 109*  HEPARINUNFRC 0.78*  --   --   --   --  0.92*  CREATININE 0.60  --   --   --   --  0.64   < > = values in this interval not displayed.    Estimated Creatinine Clearance: 53.7 mL/min (by C-G formula based on SCr of 0.64 mg/dL).  PRN: albuterol, ALPRAZolam, ondansetron (ZOFRAN) IV, oxyCODONE, sodium chloride flush  Assessment: 84 yo female with ovarian cancer currently undergoing chemotherapy treatment and hx afib on xarelto PTA, presented to the ED on 11/08/2017 with c/o tachycardia, cough and congestion. Xarelto is now on hold for thoracentesis planned on Thursday and Pharmacy is consulted to dose IV heparin as bridge therapy.  Last dose of Xarelto was given 2/3 at 17:16 so heparin will not need to start until tonight 24hr after.  Today, 11/13/2017  APTT = 109 sec (SUPRA therapeutic).  Heparin level also elevated but still appears rivaroxaban may be effecting as it is not correlating with aPTT  Using aPTT to dose heparin gtt until anti-Xa inhibitor (rivaroxaban) is eliminated and not interfering with Xa level  CBC: Hgb low/impoved, pltc decreased but stable  No bleeding noted  Goal of Therapy:  Heparin level 0.3-0.7 units/ml Monitor platelets by anticoagulation protocol: Yes   aptt=66-102 sec   Plan:   decrease heparin drip since above goal, decrease to 1150 units/hr  Daily heparin level, CBC (note low platelets s/p chemo)  F/u when to stop heparin prior to thoracentesis on Thursday - PCCM to evaluate their schedule 2/7am and decide on time then place order on when to hold heparin gtt  Doreene Eland, PharmD, BCPS.   Pager: [phone] 11/13/2017 12:52 PM

## 2017-11-13 NOTE — Clinical Social Work Note (Signed)
Clinical Social Work Assessment  Patient Details  Name: [name] MRN: [id number] Date of Birth: 16-Feb-1933  Date of referral:  11/13/17               Reason for consult:  Facility Placement                Permission sought to share information with:  Facility Sport and exercise psychologist, Family Supports Permission granted to share information::  Yes, Verbal Permission Granted  Name::     [name]  Agency::     Relationship::  Daughter   Contact Information:  [phone]  Housing/Transportation Living arrangements for the past 2 months:  [place] of Information:  Patient, Adult Children Patient Interpreter Needed:  None Criminal Activity/Legal Involvement Pertinent to Current Situation/Hospitalization:  No - Comment as needed Significant Relationships:  Adult Children Lives with:  Self Do you feel safe going back to the place where you live?  (PT recommending SNF) Need for family participation in patient care:  Yes (Comment)  Care giving concerns:  Patient from home alone with caregiver that is present part of the day to assist with ADLs. Patient reported that she was dc from SNF for ST rehab on 12/21.  PT recommending SNF.   Social Worker assessment / plan:  CSW spoke with patient at bedside regarding PT recommendation for SNF. Patient reported that she was recently dc from SNF and is agreeable to return for rehab. Patient reported that she was at [facility] SNF from October 17 - December 21 and prefers to return there. CSW explained patient's medicare coverage for SNF and patient being in co-pay days, CSW agreed to follow up with [facility] to learn more. Patient requested that CSW contact her daughter and provide update.  CSW contacted patient's daughter and discussed PT recommendation for SNF, patient's daughter agreeable with patient's plan to dc to SNF.   CSW completed FL2 and followed up with [facility].  Staff from [facility] reported  that patient has 34 remaining PART A days. CSW will update patient.  CSW will provide bed offers and continue to follow and assist with discharge planning.   Employment status:  Retired Health visitor, Managed Care PT Recommendations:  [place] / Referral to community resources:  [place]  Patient/Family's Response to care:  Patient appreciative of CSW assistance with discharge planning. Patient reported that she is not feeling better at this time. Patient agreeable to SNF for ST rehab.  Patient/Family's Understanding of and Emotional Response to Diagnosis, Current Treatment, and Prognosis: Patient verbalized understanding of of diagnosis and current treatment. Patient reported that she has a lot going on. Patient reported that she is dealing with multiple medical issues, CSW provided active listening. Patient reported that the goal is to get patient stronger to have surgery. Patient presented hopeful and verbalized plan to dc to SNF for rehab.   Emotional Assessment Appearance:  Appears stated age Attitude/Demeanor/Rapport:  Other(Cooperative) Affect (typically observed):  Appropriate Orientation:  Oriented to Self, Oriented to Place, Oriented to  Time, Oriented to Situation Alcohol / Substance use:  Not Applicable Psych involvement (Current and /or in the community):  No (Comment)  Discharge Needs  Concerns to be addressed:  Care Coordination Readmission within the last 30 days:  No Current discharge risk:  Lives alone, Physical Impairment Barriers to Discharge:  Continued Medical Work up   [employer], LCSW 11/13/2017, 2:57 PM

## 2017-11-13 NOTE — Progress Notes (Signed)
   Name: [name] MRN: [id number] DOB: 1933-09-06    ADMISSION DATE:  11/08/2017 CONSULTATION DATE:  2/4   REFERRING MD :  Auburn Bilberry  CHIEF COMPLAINT:  Pleural effusion   BRIEF PATIENT DESCRIPTION:  84 year old female followed by Dr. Lamonte Sakai for chronic obstructive pulmonary disease also has a significant history of ovarian cancer currently undergoing chemotherapy, atrial fibrillation, and somewhat chronic right pleural effusion ( first identified December 2018).  Admitted 2/1 with acute exacerbation of chronic obstructive pulmonary disease complicated by atrial fibrillation with rapid ventricular response, with some question of community-acquired pneumonia.  Right pleural effusion has increased in size.  Pulmonary asked to evaluate in regards to pleural effusion  SIGNIFICANT EVENTS    STUDIES:  CT chest 2/1: Airspace consolidation versus right medial segmental atelectasis of the right middle lobe. Interval increase in the size of right pleural effusion. Platelike areas of atelectasis in the bilateral lower lobes and lingula.  SUBJECTIVE:  Feels a little better but still SOB VITAL SIGNS: Temp:  [97.7 F (36.5 C)-98 F (36.7 C)] 97.7 F (36.5 C) (02/06 0505) Pulse Rate:  [95-113] 95 (02/06 0505) Resp:  [18] 18 (02/06 0505) BP: (108-118)/(49-59) 108/59 (02/06 0505) SpO2:  [93 %-99 %] 93 % (02/06 0816)  PHYSICAL EXAMINATION: General: 84 year old female resting in bed NAD Neuro: A&O, no focal def  HEENT:NCAT no JVD Cardiovascular:irreg irreg Lungs: improved. No wheezing. Decreased on right  Abdomen:soft not tender Musculoskeletal: W&D   Recent Labs  Lab 11/10/17 0349 11/12/17 0050 11/13/17 0551  NA 134* 134* 135  K 5.1 3.7 4.3  CL 98* 95* 97*  CO2 29 30 31   BUN 18 19 18   CREATININE 0.69 0.60 0.64  GLUCOSE 157* 158* 83   Recent Labs  Lab 11/10/17 0349 11/12/17 0102 11/13/17 0551  HGB 9.8* 9.3* 10.0*  HCT 29.2* 27.5* 30.0*  WBC 3.8* 1.6* 2.2*  PLT 93* 111*  106*   Dg Chest Port 1 View  Result Date: 11/12/2017 CLINICAL DATA:  Shortness of breath.  COPD.  Pleural effusion. EXAM: PORTABLE CHEST 1 VIEW COMPARISON:  11/08/2017 plain film and CT. FINDINGS: Right-sided Port-A-Cath terminates at the mid right atrium. Midline trachea. Normal heart size. Atherosclerosis in the transverse aorta. Right-sided pleural effusion is slightly increased. No pneumothorax. Worsened right middle and lower lobe airspace disease. Clear left lung. Underlying hyperinflation. IMPRESSION: Worsened right-sided aeration with increased pleural fluid and mid/lower lung airspace disease. Aortic Atherosclerosis (ICD10-I70.0). Electronically Signed   By: Abigail Miyamoto M.D.   On: 11/12/2017 08:02    ASSESSMENT / PLAN:  Acute exacerbation of chronic obstructive pulmonary disease in setting of coronavirus Possible community-acquired pneumonia  Chronic right pleural effusion Atrial fibrillation with rapid ventricular response Ovarian cancer diabetes Anemia of chronic disease  Discussion The right effusion is chronic going on approximately 2 months minimally.  It is unclear if this is a consequence of her underlying cancer or possibly a consequence of cancer related malnutrition or underlying cardiac dysfunction yet to be identified.  Certainly possible for chronic effusion to become acutely infected thus making fluid evaluation once again more important.  Plan thora on 2/7 Cont BDs Slow taper steroids Completed 5d abx    Erick Colace ACNP-BC [place] Pager # [phone] OR # [phone] if no answer    11/13/2017, 10:31 AM

## 2017-11-14 ENCOUNTER — Inpatient Hospital Stay (HOSPITAL_COMMUNITY): Payer: Medicare Other

## 2017-11-14 ENCOUNTER — Encounter (HOSPITAL_COMMUNITY): Payer: Self-pay

## 2017-11-14 DIAGNOSIS — B59 Pneumocystosis: Secondary | ICD-10-CM

## 2017-11-14 DIAGNOSIS — Z9889 Other specified postprocedural states: Secondary | ICD-10-CM

## 2017-11-14 DIAGNOSIS — I481 Persistent atrial fibrillation: Secondary | ICD-10-CM

## 2017-11-14 LAB — CBC WITH DIFFERENTIAL/PLATELET
BAND NEUTROPHILS: 16 %
Basophils Absolute: 0 10*3/uL (ref 0.0–0.1)
Basophils Relative: 0 %
EOS ABS: 0 10*3/uL (ref 0.0–0.7)
Eosinophils Relative: 0 %
HCT: 31.7 % — ABNORMAL LOW (ref 36.0–46.0)
HEMOGLOBIN: 10.5 g/dL — AB (ref 12.0–15.0)
LYMPHS ABS: 1.4 10*3/uL (ref 0.7–4.0)
LYMPHS PCT: 33 %
MCH: 32.3 pg (ref 26.0–34.0)
MCHC: 33.1 g/dL (ref 30.0–36.0)
MCV: 97.5 fL (ref 78.0–100.0)
Monocytes Absolute: 0.4 10*3/uL (ref 0.1–1.0)
Monocytes Relative: 9 %
Myelocytes: 2 %
Neutro Abs: 2.4 10*3/uL (ref 1.7–7.7)
Neutrophils Relative %: 40 %
Platelets: 108 10*3/uL — ABNORMAL LOW (ref 150–400)
RBC: 3.25 MIL/uL — AB (ref 3.87–5.11)
RDW: 14.4 % (ref 11.5–15.5)
WBC: 4.2 10*3/uL (ref 4.0–10.5)
nRBC: 1 /100 WBC — ABNORMAL HIGH

## 2017-11-14 LAB — BODY FLUID CELL COUNT WITH DIFFERENTIAL
Lymphs, Fluid: 41 %
MONOCYTE-MACROPHAGE-SEROUS FLUID: 52 % (ref 50–90)
Neutrophil Count, Fluid: 7 % (ref 0–25)
Total Nucleated Cell Count, Fluid: 131 cu mm (ref 0–1000)

## 2017-11-14 LAB — BASIC METABOLIC PANEL
Anion gap: 6 (ref 5–15)
CO2: 31 mmol/L (ref 22–32)
Calcium: 9.2 mg/dL (ref 8.9–10.3)
Creatinine, Ser: 0.55 mg/dL (ref 0.44–1.00)
GFR calc Af Amer: >60 mL/min (ref >60)
GFR calc non Af Amer: >60 mL/min (ref >60)
Glucose, Bld: 90 mg/dL (ref 65–99)
Sodium: 135 mmol/L (ref 135–145)

## 2017-11-14 LAB — PROTEIN, PLEURAL OR PERITONEAL FLUID: Total protein, fluid: 3.5 g/dL

## 2017-11-14 LAB — CULTURE, BLOOD (ROUTINE X 2)
CULTURE: NO GROWTH
Special Requests: ADEQUATE

## 2017-11-14 LAB — LACTATE DEHYDROGENASE: LDH: 185 U/L (ref 98–192)

## 2017-11-14 LAB — CHOLESTEROL, TOTAL: Cholesterol: 147 mg/dL (ref 0–200)

## 2017-11-14 LAB — APTT: aPTT: 80 seconds — ABNORMAL HIGH (ref 24–36)

## 2017-11-14 LAB — BASIC METABOLIC PANEL WITH GFR
BUN: 14 mg/dL (ref 6–20)
Chloride: 98 mmol/L — ABNORMAL LOW (ref 101–111)
Potassium: 4.5 mmol/L (ref 3.5–5.1)

## 2017-11-14 LAB — PROTEIN, TOTAL: Total Protein: 5.5 g/dL — ABNORMAL LOW (ref 6.5–8.1)

## 2017-11-14 LAB — LACTATE DEHYDROGENASE, PLEURAL OR PERITONEAL FLUID: LD, Fluid: 72 U/L — ABNORMAL HIGH (ref 3–23)

## 2017-11-14 LAB — HEPARIN LEVEL (UNFRACTIONATED): HEPARIN UNFRACTIONATED: 0.62 [IU]/mL (ref 0.30–0.70)

## 2017-11-14 MED ORDER — HEPARIN (PORCINE) IN NACL 100-0.45 UNIT/ML-% IJ SOLN
1150.0000 [IU]/h | INTRAMUSCULAR | Status: AC
  Administered 2017-11-14: 1150 [IU]/h via INTRAVENOUS
  Filled 2017-11-14: qty 250

## 2017-11-14 MED ORDER — SODIUM CHLORIDE 0.9 % IV BOLUS (SEPSIS)
500.0000 mL | Freq: Once | INTRAVENOUS | Status: AC
  Administered 2017-11-14: 500 mL via INTRAVENOUS

## 2017-11-14 NOTE — Progress Notes (Signed)
   Name: [name] MRN: [id number] DOB: [birth year]    ADMISSION DATE:  11/08/2017 CONSULTATION DATE:  2/4   REFERRING MD :  Auburn Bilberry  CHIEF COMPLAINT:  Pleural effusion   BRIEF PATIENT DESCRIPTION:  84 year old female followed by Dr. Lamonte Sakai for chronic obstructive pulmonary disease also has a significant history of ovarian cancer currently undergoing chemotherapy, atrial fibrillation, and somewhat chronic right pleural effusion ( first identified December 2018).  Admitted 2/1 with acute exacerbation of chronic obstructive pulmonary disease complicated by atrial fibrillation with rapid ventricular response, with some question of community-acquired pneumonia.  Right pleural effusion has increased in size.  Pulmonary asked to evaluate in regards to pleural effusion  SIGNIFICANT EVENTS    STUDIES:  CT chest 2/1: Airspace consolidation versus right medial segmental atelectasis of the right middle lobe. Interval increase in the size of right pleural effusion. Platelike areas of atelectasis in the bilateral lower lobes and lingula.  SUBJECTIVE:  Feeling better Wants to get thora  VITAL SIGNS: Temp:  [97.7 F (36.5 C)-98.7 F (37.1 C)] 98.7 F (37.1 C) (02/07 0511) Pulse Rate:  [97-109] 109 (02/07 0900) Resp:  [18-19] 19 (02/07 0818) BP: (120-128)/(50-89) 120/89 (02/07 0900) SpO2:  [93 %-98 %] 95 % (02/07 0850)  PHYSICAL EXAMINATION: General: 84 year old female. No acute distress Neuro:A&O no focal def  HEENT: NCAT. No JVd Cardiovascular: irreg irreg  Lungs: decreased on right occ rhonchi that cl w/ cough  Abdomen: soft not tender + bowel sounds Musculoskeletal: W&D   Recent Labs  Lab 11/12/17 0050 11/13/17 0551 11/14/17 0536  NA 134* 135 135  K 3.7 4.3 4.5  CL 95* 97* 98*  CO2 30 31 31   BUN 19 18 14   CREATININE 0.60 0.64 0.55  GLUCOSE 158* 83 90   Recent Labs  Lab 11/12/17 0102 11/13/17 0551 11/14/17 0536  HGB 9.3* 10.0* 10.5*  HCT 27.5* 30.0* 31.7*  WBC  1.6* 2.2* 4.2  PLT 111* 106* 108*   No results found.  ASSESSMENT / PLAN:  Acute exacerbation of chronic obstructive pulmonary disease in setting of coronavirus Possible community-acquired pneumonia  Chronic right pleural effusion Atrial fibrillation with rapid ventricular response Ovarian cancer diabetes Anemia of chronic disease  Discussion The right effusion is chronic going on approximately 2 months minimally.  It is unclear if this is a consequence of her underlying cancer or possibly a consequence of cancer related malnutrition or underlying cardiac dysfunction yet to be identified.  Certainly possible for chronic effusion to become acutely infected thus making fluid evaluation once again more important.  Plan F/u thora labs Routine pulm hygienes  Cont BDs Wean oxygen    Erick Colace ACNP-BC [place] Pager # [phone] OR # [phone] if no answer    11/14/2017, 2:22 PM

## 2017-11-14 NOTE — Progress Notes (Signed)
ANTICOAGULATION CONSULT NOTE - f/u Consult  Pharmacy Consult for Heparin while rivaroxaban on hold Indication: atrial fibrillation  Allergies  Allergen Reactions  . Codeine Itching    Patient Measurements: Height: 5\' 4"  (162.6 cm) Weight: 164 lb 14.5 oz (74.8 kg) IBW/kg (Calculated) : 54.7 Heparin Dosing Weight: 70.3kg  Vital Signs: Temp: 98.7 F (37.1 C) (02/07 0511) Temp Source: Oral (02/07 0511) BP: 120/89 (02/07 0900) Pulse Rate: 109 (02/07 0900)  Labs: Recent Labs    11/12/17 0050  11/12/17 0102  11/13/17 0551 11/13/17 1634 11/14/17 0536  HGB  --    < > 9.3*  --  10.0*  --  10.5*  HCT  --   --  27.5*  --  30.0*  --  31.7*  PLT  --   --  111*  --  106*  --  108*  APTT  --    < >  --    < > 109* 86* 80*  HEPARINUNFRC 0.78*  --   --   --  0.92*  --  0.62  CREATININE 0.60  --   --   --  0.64  --  0.55   < > = values in this interval not displayed.    Estimated Creatinine Clearance: 53.7 mL/min (by C-G formula based on SCr of 0.55 mg/dL).  PRN: albuterol, ALPRAZolam, ondansetron (ZOFRAN) IV, oxyCODONE, sodium chloride flush  Assessment: 84 yo female with ovarian cancer currently undergoing chemotherapy treatment and hx afib on xarelto PTA, presented to the ED on 11/08/2017 with c/o tachycardia, cough and congestion. Xarelto is now on hold for thoracentesis planned on Thursday and Pharmacy is consulted to dose IV heparin as bridge therapy.  Last dose of Xarelto was given 2/3 at 17:16 so heparin will not need to start until tonight 24hr after.  Today, 11/14/2017  APTT = 80 sec ( therapeutic).  Heparin level also therapeutic ato 0.62. CBC: Hgb stable, pltc decreased but stable  No bleeding noted  Goal of Therapy:  Heparin level 0.3-0.7 units/ml Monitor platelets by anticoagulation protocol: Yes  aptt=66-102 sec   Plan:   heparin drip at 1150 units/hr was stopped at 0845 by CCM for thoracentesis.   resume heparin drip 1 hr after procedure at 1530 at previous rate  of 1150 units/hr  Check heparin level 8 hrs after drip resumed  Daily heparin level, CBC,(note low platelets s/p chemo) F/u when to resume [place], Pharm.D. [phone] 11/14/2017 11:09 AM

## 2017-11-14 NOTE — Progress Notes (Signed)
PT Cancellation Note  Patient Details Name: [name] MRN: [id number] DOB: [birth year]   Cancelled Treatment:    Reason Eval/Treat Not Completed: Patient at procedure or test/unavailable Patient currently receiving thoracentesis procedure this afternoon, plan to check back later today as able/if schedule allows.    Deniece Ree PT, DPT, CBIS  Supplemental Physical Therapist [facility]   Pager [phone]

## 2017-11-14 NOTE — Procedures (Signed)
Thoracentesis Procedure Note  Pre-operative Diagnosis: right effusion   Post-operative Diagnosis: same  Indications: evaluation of pleural fluid as well as treatment of dyspnea   Procedure Details  Consent: Informed consent was obtained. Risks of the procedure were discussed including: infection, bleeding, pain, pneumothorax.  Under sterile conditions the patient was positioned. Betadine solution and sterile drapes were utilized.  1% buffered lidocaine was used to anesthetize the  rib space which was identified via real time [place]. Fluid was obtained without any difficulties and minimal blood loss.  A dressing was applied to the wound and wound care instructions were provided.   Findings 1300 ml of clear pleural fluid was obtained. A sample was sent to Pathology for cytogenetics, flow, and cell counts, as well as for infection analysis.  Complications:  None; patient tolerated the procedure well.          Condition: stable  Plan A follow up chest x-ray was ordered. Bed Rest for 0 hours. Tylenol 650 mg. for pain.  Erick Colace ACNP-BC [place] Pager # [phone] OR # [phone] if no answer

## 2017-11-14 NOTE — Progress Notes (Addendum)
Physical Therapy Treatment Patient Details Name: [name] MRN: [id number] DOB: Apr 03, 1933 Today's Date: 11/14/2017    History of Present Illness 84 y.o. female with a history of AFib on xarelto and ovarian CA undergoing chemotherapy who presented with dyspnea and productive cough found to have possible RML infiltrate and large pleural effusion, RVP demonstrated coronavirus infection. Antibiotics were started and ultimately pulmonology consulted for ongoing respiratory distress. AFib with RVR was present on admission, since improved.     PT Comments    Spoke to NP regarding PT after thoracentesis; NP reports patient is OK to participate in PT and should be feeling better this afternoon. Patient received in bed, very pleasant and motivated to participate with PT this afternoon; O2 94% on 2LPM O2, HR 84BPM.  She is able to complete functional bed mobility, gait, and transfers with S for safety and line management today, displays considerable improvement in gait speed and mechanics today although gait tolerance and distance does remain limited due to weakness and fatigue. SpO2 90%, HR 100BPM following gait.  She was left up in the chair with all needs met, concerns and questions addressed this afternoon.     Follow Up Recommendations  SNF     Equipment Recommendations  None recommended by PT    Recommendations for Other Services       Precautions / Restrictions Precautions Precautions: Fall Precaution Comments: on home O2 prn, denies h/o falls Restrictions Weight Bearing Restrictions: No    Mobility  Bed Mobility Overal bed mobility: Needs Assistance Bed Mobility: Supine to Sit     Supine to sit: Supervision     General bed mobility comments: S for safety and line management, extended time   Transfers Overall transfer level: Needs assistance Equipment used: Rolling walker (2 wheeled) Transfers: Sit to/from Stand Sit to Stand: Supervision         General transfer  comment: S for safety   Ambulation/Gait Ambulation/Gait assistance: Supervision Ambulation Distance (Feet): 50 Feet Assistive device: Rolling walker (2 wheeled) Gait Pattern/deviations: Step-through pattern;Decreased stride length;Trunk flexed     General Gait Details: gait pattern much improved today, gait speed increased, patient reports feeling much better; spO2 90%, HR 100 at end of gait period on 2LPM O2    Stairs            Wheelchair Mobility    Modified Rankin (Stroke Patients Only)       Balance Overall balance assessment: Modified Independent                                          Cognition Arousal/Alertness: Awake/alert Behavior During Therapy: WFL for tasks assessed/performed Overall Cognitive Status: Within Functional Limits for tasks assessed                                        Exercises      General Comments        Pertinent Vitals/Pain Pain Assessment: No/denies pain Pain Score: 0-No pain Pain Intervention(s): Limited activity within patient's tolerance;Monitored during session;Repositioned    Home Living                      Prior Function            PT Goals (current goals can now  be found in the care plan section) Acute Rehab PT Goals Patient Stated Goal: wants to go to ST-SNF then home PT Goal Formulation: With patient Time For Goal Achievement: 11/26/17 Potential to Achieve Goals: Good Progress towards PT goals: Progressing toward goals    Frequency    Min 3X/week      PT Plan Current plan remains appropriate    Co-evaluation              AM-PAC PT "6 Clicks" Daily Activity  Outcome Measure  Difficulty turning over in bed (including adjusting bedclothes, sheets and blankets)?: None Difficulty moving from lying on back to sitting on the side of the bed? : None Difficulty sitting down on and standing up from a chair with arms (e.g., wheelchair, bedside commode,  etc,.)?: None Help needed moving to and from a bed to chair (including a wheelchair)?: A Little Help needed walking in hospital room?: A Little Help needed climbing 3-5 steps with a railing? : A Little 6 Click Score: 21    End of Session Equipment Utilized During Treatment: Oxygen Activity Tolerance: Patient tolerated treatment well Patient left: in chair;with call bell/phone within reach Nurse Communication: Other (comment)(patient requesting eye drops ) PT Visit Diagnosis: Pain;Difficulty in walking, not elsewhere classified (R26.2) Pain - part of body: (back )     Time: 8403-7543 PT Time Calculation (min) (ACUTE ONLY): 18 min  Charges:  $Gait Training: 8-22 mins                    G Codes:       Deniece Ree PT, DPT, CBIS  Supplemental Physical Therapist [place]   Pager [phone]

## 2017-11-14 NOTE — Progress Notes (Signed)
PROGRESS NOTE    [name]  JOA:[id number] DOB: [birth year] DOA: 11/08/2017 PCP: Asencion Noble, MD   Brief Narrative:   84 year old with past medical history of hypothyroidism, atrial fibrillation on anticoagulation, diastolic heart failure, COPD, ovarian cancer on chemotherapy was admitted for weakness and dyspnea.  She was found to be nature fibrillation with RVR.  She was also noted to pneumonia with worsening pleural effusion.  Currently she is on antibiotics plans for thoracentesis today.  Assessment & Plan:   Principal Problem:   PNA (pneumonia) Active Problems:   COPD (chronic obstructive pulmonary disease) (HCC)   Atrial fibrillation (HCC)   Atrial fibrillation with RVR (HCC)   HCAP (healthcare-associated pneumonia)  Community-acquired pneumonia Right-sided pleural effusion -Due to her immunocompromised state for being on chemotherapy she has been started on cefepime.  We will continue this for now - No growth on cultures -Supplemental oxygen as needed, nebulizer treatments as needed -Unknown etiology of her effusion therefore plans on thoracentesis per pulmonology today.  Currently on heparin drip which will be interrupted for the procedure  Atrial fibrillation with RVR -Currently is better controlled, continue Cardizem -Use Cardizem drip if it becomes necessary - Currently her anticoagulation is on hold due to possible thoracentesis.  In the meantime she is on heparin drip  History of COPD -Nebulizer treatments daily and as needed  Hypothyroidism -Continue Synthroid  Generalized weakness -Physical therapy recommends skilled nursing facility  Right-sided ovarian cancer -Currently she is on outpatient chemotherapy. Dr Alvy Bimler following.   DVT prophylaxis: On heparin drip Code Status: Full code Family Communication: None at bedside Disposition Plan: To be determined  Antimicrobials:   Cefepime   Subjective: Reports she feels a little better today.  No  acute events overnight.  Objective: Vitals:   11/14/17 0511 11/14/17 0818 11/14/17 0850 11/14/17 0900  BP:  (!) 120/50  120/89  Pulse: (!) 102 (!) 102  (!) 109  Resp: 19 19    Temp: 98.7 F (37.1 C)     TempSrc: Oral     SpO2: 98% 93% 95%   Weight:      Height:        Intake/Output Summary (Last 24 hours) at 11/14/2017 1022 Last data filed at 11/14/2017 1017 Gross per 24 hour  Intake 892.5 ml  Output 1000 ml  Net -107.5 ml   Filed Weights   11/08/17 2254  Weight: 74.8 kg (164 lb 14.5 oz)    Examination:  General exam: Appears calm and comfortable  Respiratory system: Diminished breath sounds on the right side.  Right chest wall chemo port in place Cardiovascular system: S1 & S2 heard, RRR. No JVD, murmurs, rubs, gallops or clicks. No pedal edema. Gastrointestinal system: Abdomen is nondistended, soft and nontender. No organomegaly or masses felt. Normal bowel sounds heard. Central nervous system: Alert and oriented. No focal neurological deficits. Extremities: Symmetric 5 x 5 power. Skin: No rashes, lesions or ulcers Psychiatry: Judgement and insight appear normal. Mood & affect appropriate.     Data Reviewed:   CBC: Recent Labs  Lab 11/08/17 1005 11/09/17 0722 11/10/17 0349 11/12/17 0102 11/13/17 0551 11/14/17 0536  WBC 21.4* 9.2 3.8* 1.6* 2.2* 4.2  NEUTROABS 20.1*  --   --  0.7* 0.5* 2.4  HGB 11.0* 9.7* 9.8* 9.3* 10.0* 10.5*  HCT 32.7* 28.1* 29.2* 27.5* 30.0* 31.7*  MCV 96.7 96.9 97.3 96.5 97.1 97.5  PLT 121* 91* 93* 111* 106* 601*   Basic Metabolic Panel: Recent Labs  Lab 11/08/17 1005  11/09/17 0722 11/10/17 0349 11/12/17 0050 11/13/17 0551 11/14/17 0536  NA 138   < > 136 134* 134* 135 135  K 3.0*   < > 3.9 5.1 3.7 4.3 4.5  CL 97*   < > 101 98* 95* 97* 98*  CO2 32   < > 29 29 30 31 31   GLUCOSE 132*   < > 108* 157* 158* 83 90  BUN 22*   < > 14 18 19 18 14   CREATININE 0.64   < > 0.48 0.69 0.60 0.64 0.55  CALCIUM 8.8*   < > 8.4* 8.8* 8.7* 8.9  9.2  MG 1.5*  --   --   --  1.8  --   --    < > = values in this interval not displayed.   GFR: Estimated Creatinine Clearance: 53.7 mL/min (by C-G formula based on SCr of 0.55 mg/dL). Liver Function Tests: Recent Labs  Lab 11/09/17 0722  AST 18  ALT 19  ALKPHOS 50  BILITOT 0.7  PROT 5.8*  ALBUMIN 3.0*   No results for input(s): LIPASE, AMYLASE in the last 168 hours. No results for input(s): AMMONIA in the last 168 hours. Coagulation Profile: No results for input(s): INR, PROTIME in the last 168 hours. Cardiac Enzymes: No results for input(s): CKTOTAL, CKMB, CKMBINDEX, TROPONINI in the last 168 hours. BNP (last 3 results) Recent Labs    08/12/17 1137  PROBNP 837*   HbA1C: No results for input(s): HGBA1C in the last 72 hours. CBG: No results for input(s): GLUCAP in the last 168 hours. Lipid Profile: No results for input(s): CHOL, HDL, LDLCALC, TRIG, CHOLHDL, LDLDIRECT in the last 72 hours. Thyroid Function Tests: No results for input(s): TSH, T4TOTAL, FREET4, T3FREE, THYROIDAB in the last 72 hours. Anemia Panel: No results for input(s): VITAMINB12, FOLATE, FERRITIN, TIBC, IRON, RETICCTPCT in the last 72 hours. Sepsis Labs: Recent Labs  Lab 11/08/17 1005 11/08/17 1121 11/08/17 1356 11/09/17 0722 11/12/17 0050  PROCALCITON 0.31  --   --  0.16 <0.10  LATICACIDVEN  --  1.94* 0.72  --   --     Recent Results (from the past 240 hour(s))  Blood Culture (routine x 2)     Status: None   Collection Time: 11/08/17 11:15 AM  Result Value Ref Range Status   Specimen Description   Final    BLOOD LEFT ANTECUBITAL Performed at [facility], [place] [address], [place], [place] [address]    Special Requests   Final    BOTTLES DRAWN AEROBIC AND ANAEROBIC Blood Culture adequate volume Performed at [place] [address], [place], [place] [address]    Culture   Final    NO GROWTH 5 DAYS Performed at [facility], [place]  [address] [place], [place] [address]    Report Status 11/13/2017 FINAL  Final  Blood Culture (routine x 2)     Status: None (Preliminary result)   Collection Time: 11/08/17 10:39 PM  Result Value Ref Range Status   Specimen Description   Final    BLOOD RIGHT ARM Performed at [facility] [address] [place], [place] [address]    Special Requests   Final    IN PEDIATRIC BOTTLE Blood Culture adequate volume Performed at [facility] [address] [place], [place] [address]    Culture   Final    NO GROWTH 4 DAYS Performed at [facility], [place] [address],  [place], [place] [address]    Report Status PENDING  Incomplete  Respiratory Panel by PCR     Status: Abnormal   Collection Time: 11/08/17 11:00 PM  Result Value Ref Range Status   Adenovirus NOT DETECTED NOT DETECTED Final   Coronavirus 229E NOT DETECTED NOT DETECTED Final   Coronavirus HKU1 NOT DETECTED NOT DETECTED Final   Coronavirus NL63 NOT DETECTED NOT DETECTED Final   Coronavirus OC43 DETECTED (A) NOT DETECTED Final   Metapneumovirus NOT DETECTED NOT DETECTED Final   Rhinovirus / Enterovirus NOT DETECTED NOT DETECTED Final   Influenza A NOT DETECTED NOT DETECTED Final   Influenza B NOT DETECTED NOT DETECTED Final   Parainfluenza Virus 1 NOT DETECTED NOT DETECTED Final   Parainfluenza Virus 2 NOT DETECTED NOT DETECTED Final   Parainfluenza Virus 3 NOT DETECTED NOT DETECTED Final   Parainfluenza Virus 4 NOT DETECTED NOT DETECTED Final   Respiratory Syncytial Virus NOT DETECTED NOT DETECTED Final   Bordetella pertussis NOT DETECTED NOT DETECTED Final   Chlamydophila pneumoniae NOT DETECTED NOT DETECTED Final   Mycoplasma pneumoniae NOT DETECTED NOT DETECTED Final    Comment: Performed at [facility], [place] [address] [place], [place] [address]  MRSA PCR Screening     Status: None   Collection Time: 11/08/17 11:00 PM  Result Value Ref Range Status   MRSA by PCR NEGATIVE  NEGATIVE Final    Comment:        The GeneXpert MRSA Assay (FDA approved for NASAL specimens only), is one component of a comprehensive MRSA colonization surveillance program. It is not intended to diagnose MRSA infection nor to guide or monitor treatment for MRSA infections. Performed at [facility], [place] [address], [place], [place] [address]          Radiology Studies: No results found.      Scheduled Meds: . diltiazem  120 mg Oral Daily  . feeding supplement (ENSURE ENLIVE)  237 mL Oral BID BM  . fluticasone  2 spray Each Nare BID  . ipratropium-albuterol  3 mL Nebulization TID  . levothyroxine  137 mcg Oral QAC breakfast  . mirtazapine  15 mg Oral QHS  . mometasone-formoterol  2 puff Inhalation BID  . polyethylene glycol  17 g Oral Daily  . senna  2 tablet Oral QHS  . sodium chloride flush  10-40 mL Intracatheter Q12H   Continuous Infusions:   LOS: 6 days    Time spent: 30 mins    Tannya Gonet Arsenio Loader, MD [facility] Pager [phone]   If 7PM-7AM, please contact night-coverage [email or web address] Password [facility] 11/14/2017, 10:22 AM

## 2017-11-15 ENCOUNTER — Encounter (HOSPITAL_COMMUNITY): Payer: Self-pay

## 2017-11-15 ENCOUNTER — Telehealth (HOSPITAL_COMMUNITY): Payer: Self-pay | Admitting: Internal Medicine

## 2017-11-15 ENCOUNTER — Encounter (HOSPITAL_COMMUNITY): Payer: Self-pay | Admitting: Physical Therapy

## 2017-11-15 ENCOUNTER — Inpatient Hospital Stay (HOSPITAL_COMMUNITY): Payer: Medicare Other

## 2017-11-15 DIAGNOSIS — J181 Lobar pneumonia, unspecified organism: Secondary | ICD-10-CM

## 2017-11-15 LAB — COMPREHENSIVE METABOLIC PANEL
ALK PHOS: 47 U/L (ref 38–126)
ALT: 15 U/L (ref 14–54)
AST: 15 U/L (ref 15–41)
Albumin: 2.8 g/dL — ABNORMAL LOW (ref 3.5–5.0)
Anion gap: 6 (ref 5–15)
BILIRUBIN TOTAL: 0.5 mg/dL (ref 0.3–1.2)
BUN: 12 mg/dL (ref 6–20)
CALCIUM: 8.9 mg/dL (ref 8.9–10.3)
CO2: 28 mmol/L (ref 22–32)
CREATININE: 0.55 mg/dL (ref 0.44–1.00)
Chloride: 99 mmol/L — ABNORMAL LOW (ref 101–111)
GFR calc Af Amer: >60 mL/min (ref >60)
Glucose, Bld: 98 mg/dL (ref 65–99)
POTASSIUM: 4.6 mmol/L (ref 3.5–5.1)
Sodium: 133 mmol/L — ABNORMAL LOW (ref 135–145)
TOTAL PROTEIN: 5.6 g/dL — AB (ref 6.5–8.1)

## 2017-11-15 LAB — CBC WITH DIFFERENTIAL/PLATELET
BASOS PCT: 0 %
Band Neutrophils: 10 %
Basophils Absolute: 0 10*3/uL (ref 0.0–0.1)
Eosinophils Absolute: 0 10*3/uL (ref 0.0–0.7)
Eosinophils Relative: 0 %
HEMATOCRIT: 28.5 % — AB (ref 36.0–46.0)
Hemoglobin: 9.6 g/dL — ABNORMAL LOW (ref 12.0–15.0)
LYMPHS ABS: 1.5 10*3/uL (ref 0.7–4.0)
Lymphocytes Relative: 14 %
MCH: 32.9 pg (ref 26.0–34.0)
MCHC: 33.7 g/dL (ref 30.0–36.0)
MCV: 97.6 fL (ref 78.0–100.0)
MYELOCYTES: 3 %
Metamyelocytes Relative: 2 %
Monocytes Absolute: 0.9 10*3/uL (ref 0.1–1.0)
Monocytes Relative: 8 %
NEUTROS PCT: 63 %
NRBC: 2 /100{WBCs} — AB
Neutro Abs: 8.3 10*3/uL — ABNORMAL HIGH (ref 1.7–7.7)
Platelets: 111 10*3/uL — ABNORMAL LOW (ref 150–400)
RBC: 2.92 MIL/uL — ABNORMAL LOW (ref 3.87–5.11)
RDW: 14.9 % (ref 11.5–15.5)
WBC: 10.7 10*3/uL — AB (ref 4.0–10.5)

## 2017-11-15 LAB — HEPARIN LEVEL (UNFRACTIONATED)
Heparin Unfractionated: 0.43 IU/mL (ref 0.30–0.70)
Heparin Unfractionated: 0.45 IU/mL (ref 0.30–0.70)

## 2017-11-15 LAB — MAGNESIUM: MAGNESIUM: 1.3 mg/dL — AB (ref 1.7–2.4)

## 2017-11-15 MED ORDER — AMOXICILLIN-POT CLAVULANATE 875-125 MG PO TABS: 1.0000 | ORAL_TABLET | Freq: Two times a day (BID) | ORAL | 0 refills | Status: AC

## 2017-11-15 MED ORDER — RIVAROXABAN 20 MG PO TABS: 20.0000 mg | ORAL_TABLET | Freq: Every day | ORAL | Status: DC

## 2017-11-15 MED ORDER — RIVAROXABAN 20 MG PO TABS
20.0000 mg | ORAL_TABLET | Freq: Every day | ORAL | Status: DC
  Administered 2017-11-15: 20 mg via ORAL
  Filled 2017-11-15: qty 1

## 2017-11-15 MED ORDER — IPRATROPIUM-ALBUTEROL 0.5-2.5 (3) MG/3ML IN SOLN: 3.0000 mL | Freq: Three times a day (TID) | RESPIRATORY_TRACT | Status: DC

## 2017-11-15 MED ORDER — HEPARIN SOD (PORK) LOCK FLUSH 100 UNIT/ML IV SOLN
500.0000 [IU] | INTRAVENOUS | Status: AC | PRN
  Administered 2017-11-15: 500 [IU]

## 2017-11-15 MED ORDER — AMOXICILLIN-POT CLAVULANATE 875-125 MG PO TABS
1.0000 | ORAL_TABLET | Freq: Two times a day (BID) | ORAL | Status: DC
  Administered 2017-11-15: 1 via ORAL
  Filled 2017-11-15: qty 1

## 2017-11-15 MED ORDER — AMOXICILLIN-POT CLAVULANATE 875-125 MG PO TABS: 1.0000 | ORAL_TABLET | Freq: Two times a day (BID) | ORAL | 0 refills | Status: DC

## 2017-11-15 MED ORDER — OXYCODONE HCL 10 MG PO TABS: 10.0000 mg | ORAL_TABLET | ORAL | 0 refills | Status: DC | PRN

## 2017-11-15 MED ORDER — POLYETHYLENE GLYCOL 3350 17 G PO PACK: 17.0000 g | PACK | Freq: Every day | ORAL | 0 refills | Status: DC

## 2017-11-15 MED ORDER — ALPRAZOLAM 0.25 MG PO TABS: 0.2500 mg | ORAL_TABLET | Freq: Every evening | ORAL | 0 refills | Status: DC | PRN

## 2017-11-15 NOTE — Clinical Social Work Placement (Signed)
Patient received and accepted bed offer at [facility]. Facility aware of patient's discharge and confirmed bed offer. PTAR contacted, patient's family notified. Patient's RN can call report to [phone], packet complete. CSW signing off, no other needs identified.  CLINICAL SOCIAL WORK PLACEMENT  NOTE  Date:  11/15/2017  Patient Details  Name: [name] MRN: [id number] Date of Birth: 1933-05-23  Clinical Social Work is seeking post-discharge placement for this patient at the [place] level of care (*CSW will initial, date and re-position this form in  chart as items are completed):  Yes   Patient/family provided with [facility] Work Department's list of facilities offering this level of care within the geographic area requested by the patient (or if unable, by the patient's family).  Yes   Patient/family informed of their freedom to choose among providers that offer the needed level of care, that participate in Medicare, Medicaid or managed care program needed by the patient, have an available bed and are willing to accept the patient.  Yes   Patient/family informed of [facility]'s ownership interest in [facility] and [facility], as well as of the fact that they are under no obligation to receive care at these facilities.  PASRR submitted to EDS on       PASRR number received on       Existing PASRR number confirmed on 11/13/17     FL2 transmitted to all facilities in geographic area requested by pt/family on 11/13/17     FL2 transmitted to all facilities within larger geographic area on       Patient informed that his/her managed care company has contracts with or will negotiate with certain facilities, including the following:        Yes   Patient/family informed of bed offers received.  Patient chooses bed at [facility]     Physician recommends and patient chooses bed at      Patient to be transferred  to [facility] on 11/15/17.  Patient to be transferred to facility by PTAR     Patient family notified on 11/15/17 of transfer.  Name of family member notified:  [name]     PHYSICIAN       Additional Comment:    _______________________________________________ Burnis Medin, LCSW 11/15/2017, 2:56 PM

## 2017-11-15 NOTE — Plan of Care (Signed)
  Clinical Measurements: Respiratory complications will improve 11/15/2017 0224 - Progressing by Ashley Murrain, RN

## 2017-11-15 NOTE — Discharge Summary (Addendum)
Physician Discharge Summary  [name] MBW:[id number] DOB: 1933/03/24 DOA: 11/08/2017  PCP: Asencion Noble, MD  Admit date: 11/08/2017 Discharge date: 11/15/2017  Admitted From: Home Disposition: SNF - [facility]  Recommendations for Outpatient Follow-up:  1. Follow up with PCP in 1-2 weeks 2. Follow up with Onc in 2-4 weeks  3. Take Augmentin for 5 days    CODE STATUS: Full  Diet recommendation: Heart Healthy / Carb Modified / Regular / Dysphagia   Brief/Interim Summary: 84 year old female with a history of hypothyroidism, atrial fibrillation on anticoagulation, diastolic CHF, ovarian cancer on chemotherapy, COPD came to the hospital with complains of worsening shortness of breath and weakness.  She was found to be in atrial fibrillation with RVR which quickly improved.  She was noted to have pneumonia with right-sided pleural effusion for which thoracentesis was performed by pulmonary.  Fluid was sent for further analysis which was negative for any acute pathology and cytology was pending at the time of discharge.  After thoracentesis patient felt much better and was weaned off the oxygen as well.  For thoracentesis anticoagulation was interrupted and was briefly on heparin drip but the following day her Xarelto was resumed. During her stay she was evaluated by physical therapy who recommended skilled nursing facility therefore arrangements were made for her to be transitioned. Patient does not have any complaints today, she states she feels great.  She has reached maximum benefit from an hospital stay therefore she is stable to be discharged with outpatient follow-up.  Discharge Diagnoses:  Principal Problem:   PNA (pneumonia) Active Problems:   COPD (chronic obstructive pulmonary disease) (HCC)   Atrial fibrillation (HCC)   Atrial fibrillation with RVR (HCC)   HCAP (healthcare-associated pneumonia)   S/P thoracentesis  Community-acquired pneumonia, improving is now compromised as  she is on chemotherapy Right-sided pleural effusion, status post thoracentesis 11/14/17 - Cultures remain negative thus far, she is remained afebrile.  Her cefepime will be converted to Augmentin for 5 more days -Continue nebulizer treatments as needed and supplemental oxygen if required -Provide supportive care -Follow with primary care physician in 1-2 weeks  Ovarian cancer -Following Dr. Alvy Bimler who had seen the patient in the hospital.  Continue outpatient chemotherapy  History of atrial fibrillation, rate control -She is currently on oral Cardizem, continue her oral Xarelto.  Heparin will be stopped  History of COPD -Nebulizer treatments as needed  Hypothyroidism -Continue Synthroid  Generalized weakness -Physical therapy has recommended skilled nursing facility  Discharge Instructions   Allergies as of 11/15/2017      Reactions   Codeine Itching      Medication List    TAKE these medications   albuterol 108 (90 Base) MCG/ACT inhaler Commonly known as:  PROAIR HFA Inhale 2 puffs into the lungs every 6 (six) hours as needed for wheezing or shortness of breath.   ALPRAZolam 0.25 MG tablet Commonly known as:  XANAX Take 1 tablet (0.25 mg total) by mouth at bedtime as needed for anxiety.   amoxicillin-clavulanate 875-125 MG tablet Commonly known as:  AUGMENTIN Take 1 tablet by mouth every 12 (twelve) hours for 5 days.   azelastine 0.1 % nasal spray Commonly known as:  ASTELIN Place 2 sprays into both nostrils 2 (two) times daily as needed for allergies.   budesonide-formoterol 160-4.5 MCG/ACT inhaler Commonly known as:  SYMBICORT Inhale 2 puffs into the lungs 2 (two) times daily.   diltiazem 120 MG 24 hr capsule Commonly known as:  CARTIA XT TAKE 1  CAPSULE BY MOUTH EVERY DAY   diphenoxylate-atropine 2.5-0.025 MG tablet Commonly known as:  LOMOTIL Take 1 tablet by mouth 4 (four) times daily. What changed:    when to take this  reasons to take this    fluticasone 50 MCG/ACT nasal spray Commonly known as:  FLONASE Place 2 sprays into both nostrils 2 (two) times daily. For nasal congestion   furosemide 20 MG tablet Commonly known as:  LASIX Take 1 tablet (20 mg total) by mouth daily.   ipratropium-albuterol 0.5-2.5 (3) MG/3ML Soln Commonly known as:  DUONEB Take 3 mLs by nebulization 3 (three) times daily.   levothyroxine 137 MCG tablet Commonly known as:  SYNTHROID, LEVOTHROID Take 137 mcg by mouth every morning. For thyroid therapy   lidocaine-prilocaine cream Commonly known as:  EMLA Apply 1 application topically as needed.   loperamide 2 MG capsule Commonly known as:  IMODIUM Take 2 capsules (4 mg total) by mouth 3 (three) times daily. What changed:    when to take this  reasons to take this   magic mouthwash w/lidocaine Soln Take 5 mLs by mouth 4 (four) times daily. 5 ml QID swish and spit What changed:    when to take this  reasons to take this  additional instructions   methylcellulose 1 % ophthalmic solution Commonly known as:  ARTIFICIAL TEARS Place 1 drop into both eyes 2 (two) times daily as needed (for dry eyes).   mirtazapine 15 MG tablet Commonly known as:  REMERON Take 1 tablet (15 mg total) by mouth at bedtime.   ondansetron 8 MG tablet Commonly known as:  ZOFRAN Take 1 tablet (8 mg total) by mouth every 8 (eight) hours as needed for nausea or vomiting.   Oxycodone HCl 10 MG Tabs Take 1 tablet (10 mg total) by mouth every 4 (four) hours as needed. What changed:  reasons to take this   polyethylene glycol packet Commonly known as:  MIRALAX / GLYCOLAX Take 17 g by mouth daily. Start taking on:  11/16/2017   prochlorperazine 10 MG tablet Commonly known as:  COMPAZINE Take 10 mg by mouth every 6 (six) hours as needed for nausea.   senna-docusate 8.6-50 MG tablet Commonly known as:  Senokot-S Take 1 tablet by mouth at bedtime.   Vitamin D 2000 units Caps Take 2,000 Units by mouth  daily.   XARELTO 20 MG Tabs tablet Generic drug:  rivaroxaban TAKE 1 TABLET BY MOUTH EVERY DAY WITH SUPPER      Follow-up Information    Asencion Noble, MD. Schedule an appointment as soon as possible for a visit in 1 week(s).   Specialty:  Internal Medicine Contact information: [address] [place] [address] [phone]          Allergies  Allergen Reactions  . Codeine Itching    On your next visit with your primary care physician please Get Medicines reviewed and adjusted.   Please request your Prim.MD to go over all Hospital Tests and Procedure/Radiological results at the follow up, please get all Hospital records sent to your Prim MD by signing hospital release before you go home.   If you experience worsening of your admission symptoms, develop shortness of breath, life threatening emergency, suicidal or homicidal thoughts you must seek medical attention immediately by calling 911 or calling your MD immediately  if symptoms less severe.  You Must read complete instructions/literature along with all the possible adverse reactions/side effects for all the Medicines you take and that have  been prescribed to you. Take any new Medicines after you have completely understood and accpet all the possible adverse reactions/side effects.   Do not drive, operate heavy machinery, perform activities at heights, swimming or participation in water activities or provide baby sitting services if your were admitted for syncope or siezures until you have seen by Primary MD or a Neurologist and advised to do so again.  Do not drive when taking Pain medications.    Do not take more than prescribed Pain, Sleep and Anxiety Medications  Special Instructions: If you have smoked or chewed Tobacco  in the last 2 yrs please stop smoking, stop any regular Alcohol  and or any Recreational drug use.  Wear Seat belts while driving.   Please note  You were cared for by a  hospitalist during your hospital stay. If you have any questions about your discharge medications or the care you received while you were in the hospital after you are discharged, you can call the unit and asked to speak with the hospitalist on call if the hospitalist that took care of you is not available. Once you are discharged, your primary care physician will handle any further medical issues. Please note that NO REFILLS for any discharge medications will be authorized once you are discharged, as it is imperative that you return to your primary care physician (or establish a relationship with a primary care physician if you do not have one) for your aftercare needs so that they can reassess your need for medications and monitor your lab values.   Increase activity slowly        Consultations:  Oncology   Pulmonary    Procedures/Studies: Dg Chest 2 View  Result Date: 11/08/2017 CLINICAL DATA:  Tachycardia. EXAM: CHEST  2 VIEW COMPARISON:  09/24/2017. FINDINGS: PowerPort catheter noted with tip over the right atrium. Heart size normal. Atelectatic changes right mid and lower lung. Possible right base infiltrate. Follow-up exam suggested demonstrate clearing. Small right pleural effusion. No pneumothorax. Diffuse thoracic spine osteopenia degenerative change. IMPRESSION: 1.  PowerPort catheter noted with tip over the right atrium. 2. Right mid lung and right base atelectatic changes. Right base infiltrate cannot be excluded. Small right pleural effusion. Follow-up chest x-rays to demonstrate clearing suggested. Electronically Signed   By: Marcello Moores  Register   On: 11/08/2017 10:29   Ct Angio Chest Pe W Or Wo Contrast  Result Date: 11/08/2017 CLINICAL DATA:  Patient is undergoing chemotherapy for ovarian cancer. No history of atrial fibrillation. Weakness, dizziness, productive cough, pleuritic chest pain. EXAM: CT ANGIOGRAPHY CHEST WITH CONTRAST TECHNIQUE: Multidetector CT imaging of the chest was  performed using the standard protocol during bolus administration of intravenous contrast. Multiplanar CT image reconstructions and MIPs were obtained to evaluate the vascular anatomy. CONTRAST:  100 cc Isovue 370 intravenously. COMPARISON:  09/18/2017 FINDINGS: Cardiovascular: Satisfactory opacification of the pulmonary arteries to the segmental level. No evidence of pulmonary embolism. Normal heart size. No pericardial effusion. Injectable port terminates within the right atrium. Calcific atherosclerotic disease of the aorta and coronary arteries, mild. Mediastinum/Nodes: No enlarged mediastinal, hilar, or axillary lymph nodes. Thyroid gland, trachea, and esophagus demonstrate no significant findings. Lungs/Pleura: Enlarged water density right pleural effusion. Right middle lobe airspace consolidation versus segmental atelectasis. Platelike atelectasis in the right lower lobe. Minimal atelectatic changes in the left lower lobe and lingula. Upper Abdomen: No acute abnormality. Musculoskeletal: No chest wall abnormality. No acute or significant osseous findings. Review of the MIP images confirms the above  findings. IMPRESSION: Airspace consolidation versus right medial segmental atelectasis of the right middle lobe. Interval increase in the size of right pleural effusion. Platelike areas of atelectasis in the bilateral lower lobes and lingula. Electronically Signed   By: Fidela Salisbury M.D.   On: 11/08/2017 15:16   Dg Chest Port 1 View  Result Date: 11/15/2017 CLINICAL DATA:  Dyspnea EXAM: PORTABLE CHEST 1 VIEW COMPARISON:  11/14/2017 FINDINGS: Cardiac shadow is within normal limits. Aortic calcifications are again seen. Right chest wall port is again seen and stable. The lungs are well aerated bilaterally. No sizable effusion is seen. Minimal left basilar atelectasis is noted. IMPRESSION: Minimal left basilar atelectasis. Electronically Signed   By: Inez Catalina M.D.   On: 11/15/2017 07:47   Dg Chest Port  1 View  Result Date: 11/14/2017 CLINICAL DATA:  Status post thoracentesis on the right EXAM: PORTABLE CHEST 1 VIEW COMPARISON:  11/12/2017 FINDINGS: Cardiac shadow is within normal limits. Improved aeration in the right lung base is noted. No pneumothorax is seen. The remainder the exam is stable from the prior study. IMPRESSION: No pneumothorax following right thoracentesis. Electronically Signed   By: Inez Catalina M.D.   On: 11/14/2017 15:00   Dg Chest Port 1 View  Result Date: 11/12/2017 CLINICAL DATA:  Shortness of breath.  COPD.  Pleural effusion. EXAM: PORTABLE CHEST 1 VIEW COMPARISON:  11/08/2017 plain film and CT. FINDINGS: Right-sided Port-A-Cath terminates at the mid right atrium. Midline trachea. Normal heart size. Atherosclerosis in the transverse aorta. Right-sided pleural effusion is slightly increased. No pneumothorax. Worsened right middle and lower lobe airspace disease. Clear left lung. Underlying hyperinflation. IMPRESSION: Worsened right-sided aeration with increased pleural fluid and mid/lower lung airspace disease. Aortic Atherosclerosis (ICD10-I70.0). Electronically Signed   By: Abigail Miyamoto M.D.   On: 11/12/2017 08:02      Subjective:   Discharge Exam: Vitals:   11/15/17 0747 11/15/17 0802  BP:  (!) 119/50  Pulse:  95  Resp:    Temp:    SpO2: 95%    Vitals:   11/14/17 1955 11/15/17 0614 11/15/17 0747 11/15/17 0802  BP: (!) 129/52 (!) 113/48  (!) 119/50  Pulse: (!) 101 87  95  Resp: 18 18    Temp: 99.1 F (37.3 C) 98.7 F (37.1 C)    TempSrc: Oral Oral    SpO2: 98% 96% 95%   Weight:      Height:        General: Pt is alert, awake, not in acute distress Cardiovascular: RRR, S1/S2 +, no rubs, no gallops Respiratory: CTA bilaterally with very mild rhonchi at the bases.  Abdominal: Soft, NT, ND, bowel sounds + Extremities: no edema, no cyanosis    The results of significant diagnostics from this hospitalization (including imaging, microbiology, ancillary  and laboratory) are listed below for reference.     Microbiology: Recent Results (from the past 240 hour(s))  Blood Culture (routine x 2)     Status: None   Collection Time: 11/08/17 11:15 AM  Result Value Ref Range Status   Specimen Description   Final    BLOOD LEFT ANTECUBITAL Performed at [facility] [address], [place], [place] [address]    Special Requests   Final    BOTTLES DRAWN AEROBIC AND ANAEROBIC Blood Culture adequate volume Performed at [facility] [address], [place], [place] [address]    Culture   Final    NO GROWTH 5 DAYS Performed at [facility], [place] [address], [place], [place] [address]    Report Status 11/13/2017 FINAL  Final  Blood Culture (routine x 2)     Status: None   Collection Time: 11/08/17 10:39 PM  Result Value Ref Range Status   Specimen Description   Final    BLOOD RIGHT ARM Performed at [facility] [address], [place], [place] [address]    Special Requests   Final    IN PEDIATRIC BOTTLE Blood Culture adequate volume Performed at [facility] [address], [place], [place] [address]    Culture   Final    NO GROWTH 5 DAYS Performed at [facility], [place] [address], [place], [place] [address]    Report Status 11/14/2017 FINAL  Final  Respiratory Panel by PCR     Status: Abnormal   Collection Time: 11/08/17 11:00 PM  Result Value Ref Range Status   Adenovirus NOT DETECTED NOT DETECTED Final   Coronavirus 229E NOT DETECTED NOT DETECTED Final   Coronavirus HKU1 NOT DETECTED NOT DETECTED Final   Coronavirus NL63 NOT DETECTED NOT DETECTED Final   Coronavirus OC43 DETECTED (A) NOT DETECTED Final   Metapneumovirus NOT DETECTED NOT DETECTED Final   Rhinovirus / Enterovirus NOT DETECTED NOT DETECTED Final   Influenza A NOT DETECTED NOT DETECTED Final   Influenza B NOT DETECTED NOT DETECTED Final   Parainfluenza Virus 1 NOT DETECTED NOT  DETECTED Final   Parainfluenza Virus 2 NOT DETECTED NOT DETECTED Final   Parainfluenza Virus 3 NOT DETECTED NOT DETECTED Final   Parainfluenza Virus 4 NOT DETECTED NOT DETECTED Final   Respiratory Syncytial Virus NOT DETECTED NOT DETECTED Final   Bordetella pertussis NOT DETECTED NOT DETECTED Final   Chlamydophila pneumoniae NOT DETECTED NOT DETECTED Final   Mycoplasma pneumoniae NOT DETECTED NOT DETECTED Final    Comment: Performed at [facility], [place] [address], [place], [place] [address]  MRSA PCR Screening     Status: None   Collection Time: 11/08/17 11:00 PM  Result Value Ref Range Status   MRSA by PCR NEGATIVE NEGATIVE Final    Comment:        The GeneXpert MRSA Assay (FDA approved for NASAL specimens only), is one component of a comprehensive MRSA colonization surveillance program. It is not intended to diagnose MRSA infection nor to guide or monitor treatment for MRSA infections. Performed at [facility], [place] [address], [place], [place] [address]   Body fluid culture     Status: None (Preliminary result)   Collection Time: 11/14/17  2:02 PM  Result Value Ref Range Status   Specimen Description   Final    THORACENTESIS Performed at [facility] [address], [place], [place] [address]    Special Requests   Final    NONE Performed at [facility], [place] [address] [place], [place] [address]    Gram Stain NO WBC SEEN NO ORGANISMS SEEN   Final   Culture   Final    NO GROWTH 1 DAY Performed at [facility], [place] [address], [place], [place] [address]    Report Status PENDING  Incomplete     Labs: BNP (last 3 results) Recent Labs    07/07/17 1057 11/12/17 0102  BNP 42.9 11.9   Basic Metabolic Panel: Recent Labs  Lab 11/10/17 0349 11/12/17 0050 11/13/17 0551 11/14/17 0536 11/15/17 0516  NA 134* 134* 135 135 133*  K 5.1 3.7 4.3 4.5 4.6  CL 98* 95* 97* 98* 99*  CO2 29 30 31 31 28    GLUCOSE 157* 158* 83 90 98  BUN 18 19 18 14 12   CREATININE 0.69 0.60 0.64 0.55 0.55  CALCIUM 8.8* 8.7* 8.9 9.2 8.9  MG  --  1.8  --   --  1.3*   Liver Function Tests: Recent Labs  Lab 11/09/17 0722 11/14/17 1413 11/15/17 0516  AST 18  --  15  ALT 19  --  15  ALKPHOS 50  --  47  BILITOT 0.7  --  0.5  PROT 5.8* 5.5* 5.6*  ALBUMIN 3.0*  --  2.8*   No results for input(s): LIPASE, AMYLASE in the last 168 hours. No results for input(s): AMMONIA in the last 168 hours. CBC: Recent Labs  Lab 11/10/17 0349 11/12/17 0102 11/13/17 0551 11/14/17 0536 11/15/17 0516  WBC 3.8* 1.6* 2.2* 4.2 10.7*  NEUTROABS  --  0.7* 0.5* 2.4 8.3*  HGB 9.8* 9.3* 10.0* 10.5* 9.6*  HCT 29.2* 27.5* 30.0* 31.7* 28.5*  MCV 97.3 96.5 97.1 97.5 97.6  PLT 93* 111* 106* 108* 111*   Cardiac Enzymes: No results for input(s): CKTOTAL, CKMB, CKMBINDEX, TROPONINI in the last 168 hours. BNP: Invalid input(s): POCBNP CBG: No results for input(s): GLUCAP in the last 168 hours. D-Dimer No results for input(s): DDIMER in the last 72 hours. Hgb A1c No results for input(s): HGBA1C in the last 72 hours. Lipid Profile Recent Labs    11/14/17 1413  CHOL 147   Thyroid function studies No results for input(s): TSH, T4TOTAL, T3FREE, THYROIDAB in the last 72 hours.  Invalid input(s): FREET3 Anemia work up No results for input(s): VITAMINB12, FOLATE, FERRITIN, TIBC, IRON, RETICCTPCT in the last 72 hours. Urinalysis    Component Value Date/Time   COLORURINE YELLOW 06/30/2017 1235   APPEARANCEUR HAZY (A) 06/30/2017 1235   LABSPEC 1.030 10/11/2017 1125   PHURINE 6.0 10/11/2017 1125   PHURINE 6.0 06/30/2017 1235   GLUCOSEU Negative 10/11/2017 1125   HGBUR Small 10/11/2017 1125   HGBUR MODERATE (A) 06/30/2017 1235   BILIRUBINUR Negative 10/11/2017 1125   KETONESUR Negative 10/11/2017 1125   KETONESUR 40 (A) 06/30/2017 1235   PROTEINUR 30 10/11/2017 1125   PROTEINUR 100 (A) 06/30/2017 1235   UROBILINOGEN  0.2 10/11/2017 1125   NITRITE Negative 10/11/2017 1125   NITRITE NEGATIVE 06/30/2017 1235   LEUKOCYTESUR Negative 10/11/2017 1125   Sepsis Labs Invalid input(s): PROCALCITONIN,  WBC,  LACTICIDVEN Microbiology Recent Results (from the past 240 hour(s))  Blood Culture (routine x 2)     Status: None   Collection Time: 11/08/17 11:15 AM  Result Value Ref Range Status   Specimen Description   Final    BLOOD LEFT ANTECUBITAL Performed at [facility] [address] [place], [place] [address]    Special Requests   Final    BOTTLES DRAWN AEROBIC AND ANAEROBIC Blood Culture adequate volume Performed at [facility] [address], [place], [place] [address]    Culture   Final    NO GROWTH 5 DAYS Performed at [facility], [place] [address], [place], [place] [address]    Report Status 11/13/2017 FINAL  Final  Blood Culture (routine x 2)     Status: None   Collection Time: 11/08/17 10:39 PM  Result Value Ref Range Status   Specimen Description   Final    BLOOD RIGHT ARM Performed at [facility]  [place] [address] [place], [place] [address]    Special Requests   Final    IN PEDIATRIC BOTTLE Blood Culture adequate volume Performed at [facility] [address], [place], [place] [address]    Culture   Final    NO GROWTH 5 DAYS Performed at [facility], [place] [address], [place], [place] [address]    Report Status 11/14/2017 FINAL  Final  Respiratory Panel by PCR     Status: Abnormal   Collection Time: 11/08/17 11:00 PM  Result Value Ref Range Status   Adenovirus NOT DETECTED NOT DETECTED Final   Coronavirus 229E NOT DETECTED NOT DETECTED Final   Coronavirus HKU1 NOT DETECTED NOT DETECTED Final   Coronavirus NL63 NOT DETECTED NOT DETECTED Final   Coronavirus OC43 DETECTED (A) NOT DETECTED Final   Metapneumovirus NOT DETECTED NOT DETECTED Final   Rhinovirus / Enterovirus NOT DETECTED NOT DETECTED  Final   Influenza A NOT DETECTED NOT DETECTED Final   Influenza B NOT DETECTED NOT DETECTED Final   Parainfluenza Virus 1 NOT DETECTED NOT DETECTED Final   Parainfluenza Virus 2 NOT DETECTED NOT DETECTED Final   Parainfluenza Virus 3 NOT DETECTED NOT DETECTED Final   Parainfluenza Virus 4 NOT DETECTED NOT DETECTED Final   Respiratory Syncytial Virus NOT DETECTED NOT DETECTED Final   Bordetella pertussis NOT DETECTED NOT DETECTED Final   Chlamydophila pneumoniae NOT DETECTED NOT DETECTED Final   Mycoplasma pneumoniae NOT DETECTED NOT DETECTED Final    Comment: Performed at [facility], [place] [address], [place], [place] [address]  MRSA PCR Screening     Status: None   Collection Time: 11/08/17 11:00 PM  Result Value Ref Range Status   MRSA by PCR NEGATIVE NEGATIVE Final    Comment:        The GeneXpert MRSA Assay (FDA approved for NASAL specimens only), is one component of a comprehensive MRSA colonization surveillance program. It is not intended to diagnose MRSA infection nor to guide or monitor treatment for MRSA infections. Performed at [facility], [place] [address], [place], [place] [address]   Body fluid culture     Status: None (Preliminary result)   Collection Time: 11/14/17  2:02 PM  Result Value Ref Range Status   Specimen Description   Final    THORACENTESIS Performed at [facility] [address], [place], [place] [address]    Special Requests   Final    NONE Performed at [facility], [place] [address], [place], [place] [address]    Gram Stain NO WBC SEEN NO ORGANISMS SEEN   Final   Culture   Final    NO GROWTH 1 DAY Performed at [facility], [place] [address], [place], [place] [address]    Report Status PENDING  Incomplete     Time coordinating discharge: Over 30 minutes  SIGNED:   Damita Lack, MD  [facility] 11/15/2017, 12:16 PM Pager   If 7PM-7AM, please contact  night-coverage [email or web address] Password [email or web address]

## 2017-11-15 NOTE — Telephone Encounter (Signed)
8/19  daughter called to say patient was being admitted to rehab for 35 days and then would be back for therapy

## 2017-11-15 NOTE — Progress Notes (Addendum)
ANTICOAGULATION CONSULT NOTE - f/u Consult  Pharmacy Consult for Heparin while rivaroxaban on hold Indication: atrial fibrillation  Allergies  Allergen Reactions  . Codeine Itching    Patient Measurements: Height: 5\' 4"  (162.6 cm) Weight: 164 lb 14.5 oz (74.8 kg) IBW/kg (Calculated) : 54.7 Heparin Dosing Weight: 70.3kg  Vital Signs: Temp: 98.7 F (37.1 C) (02/08 0614) Temp Source: Oral (02/08 0614) BP: 119/50 (02/08 0802) Pulse Rate: 95 (02/08 0802)  Labs: Recent Labs    11/13/17 0551 11/13/17 1634 11/14/17 0536 11/14/17 2333 11/15/17 0516 11/15/17 0920  HGB 10.0*  --  10.5*  --  9.6*  --   HCT 30.0*  --  31.7*  --  28.5*  --   PLT 106*  --  108*  --  111*  --   APTT 109* 86* 80*  --   --   --   HEPARINUNFRC 0.92*  --  0.62 0.45  --  0.43  CREATININE 0.64  --  0.55  --  0.55  --     Estimated Creatinine Clearance: 53.7 mL/min (by C-G formula based on SCr of 0.55 mg/dL).  PRN: albuterol, ALPRAZolam, ondansetron (ZOFRAN) IV, oxyCODONE, sodium chloride flush  Assessment: 84 yo female with ovarian cancer currently undergoing chemotherapy treatment and hx afib on xarelto PTA, presented to the ED on 11/08/2017 with c/o tachycardia, cough and congestion. Xarelto is now on hold for thoracentesis planned on Thursday and Pharmacy is consulted to dose IV heparin as bridge therapy.  Last dose of Xarelto was given 2/3 at 17:16 so heparin will not need to start until tonight 24hr after.    11/15/2017   Heparin level remains therapeutic at 0.43 on heparin drip at 1150 units/hr.  Hg 9.5, pltc improved to 111.  ? When to resume xarelto?  Goal of Therapy:  Heparin level 0.3-0.7 units/ml Monitor platelets by anticoagulation protocol: Yes    Plan:   Continue heparin drip at 1150 units/hr  Daily heparin level, CBC,(note low platelets s/p chemo)  F/u when to resume Amorita, Pharm.D. [phone] 11/15/2017 10:35 AM  Addendum: d/w Dr Reesa Chew Transition back to  xarelto today - orders placed in Epic and d/w RN  Eudelia Bunch, Pharm.D. [phone] 11/15/2017 11:00 AM

## 2017-11-15 NOTE — Progress Notes (Signed)
[name]   DOB:1933-01-05   VV#:[id number]    Subjective: She felt better after thoracentesis.  Cough is improving.  Objective:  Vitals:   11/15/17 0747 11/15/17 0802  BP:  (!) 119/50  Pulse:  95  Resp:    Temp:    SpO2: 95%      Intake/Output Summary (Last 24 hours) at 11/15/2017 0819 Last data filed at 11/15/2017 0641 Gross per 24 hour  Intake 1525.03 ml  Output 353 ml  Net 1172.03 ml    GENERAL:alert, no distress and comfortable SKIN: skin color, texture, turgor are normal, no rashes or significant lesions EYES: normal, Conjunctiva are pink and non-injected, sclera clear OROPHARYNX:no exudate, no erythema and lips, buccal mucosa, and tongue normal  NECK: supple, thyroid normal size, non-tender, without nodularity LYMPH:  no palpable lymphadenopathy in the cervical, axillary or inguinal LUNGS: clear to auscultation and percussion with normal breathing effort HEART: regular rate & rhythm and no murmurs and no lower extremity edema ABDOMEN:abdomen soft, non-tender and normal bowel sounds Musculoskeletal:no cyanosis of digits and no clubbing  NEURO: alert & oriented x 3 with fluent speech, no focal motor/sensory deficits   Labs:  Lab Results  Component Value Date   WBC 10.7 (H) 11/15/2017   HGB 9.6 (L) 11/15/2017   HCT 28.5 (L) 11/15/2017   MCV 97.6 11/15/2017   PLT 111 (L) 11/15/2017   NEUTROABS 8.3 (H) 11/15/2017    Lab Results  Component Value Date   NA 133 (L) 11/15/2017   K 4.6 11/15/2017   CL 99 (L) 11/15/2017   CO2 28 11/15/2017    Studies:  Dg Chest Port 1 View  Result Date: 11/15/2017 CLINICAL DATA:  Dyspnea EXAM: PORTABLE CHEST 1 VIEW COMPARISON:  11/14/2017 FINDINGS: Cardiac shadow is within normal limits. Aortic calcifications are again seen. Right chest wall port is again seen and stable. The lungs are well aerated bilaterally. No sizable effusion is seen. Minimal left basilar atelectasis is noted. IMPRESSION: Minimal left basilar atelectasis.  Electronically Signed   By: Inez Catalina M.D.   On: 11/15/2017 07:47   Dg Chest Port 1 View  Result Date: 11/14/2017 CLINICAL DATA:  Status post thoracentesis on the right EXAM: PORTABLE CHEST 1 VIEW COMPARISON:  11/12/2017 FINDINGS: Cardiac shadow is within normal limits. Improved aeration in the right lung base is noted. No pneumothorax is seen. The remainder the exam is stable from the prior study. IMPRESSION: No pneumothorax following right thoracentesis. Electronically Signed   By: Inez Catalina M.D.   On: 11/14/2017 15:00    Assessment & Plan:   Ovarian CA, right (HCC) We had a long discussion about plan of care She is very frail She has recentlycompleted 6cyclesof treatment last week Continue supportive care  Pancytopenia, acquired ([place]) She is frail She has lost a lot of weight She does not need blood transfusion There is no contraindication to remain on antiplatelet agents or anticoagulants as long as the platelet is greater than 50,000. Her neutropenia is due to recent chemotherapy. I recommend G-CSF support and she agreed to proceed.  G-CSF was started on 11/12/2017 with improvement.    COPD (chronic obstructive pulmonary disease) (HCC)exacerbation with healthcare associatedpneumonia Continue IV antibiotics, oxygen and nebulizers as needed  Generalized weakness Appreciate PT consultation The patient may benefit from skilled nursing facility upon discharge  Pleural effusion on right, resolved after thoracentesis 11/14/17  Fluid overload, SOB, improving The patient had recent fluid overload requiring diuretic therapy BNP is within normal limits  Severe constipation, resolved  Discharge planning Awaiting SNF  Heath Lark, MD 11/15/2017  8:19 AM

## 2017-11-15 NOTE — Progress Notes (Signed)
ANTICOAGULATION CONSULT NOTE - f/u Consult  Pharmacy Consult for Heparin while rivaroxaban on hold Indication: atrial fibrillation  Allergies  Allergen Reactions  . Codeine Itching    Patient Measurements: Height: 5\' 4"  (162.6 cm) Weight: 164 lb 14.5 oz (74.8 kg) IBW/kg (Calculated) : 54.7 Heparin Dosing Weight: 70.3kg  Vital Signs: Temp: 99.1 F (37.3 C) (02/07 1955) Temp Source: Oral (02/07 1955) BP: 129/52 (02/07 1955) Pulse Rate: 101 (02/07 1955)  Labs: Recent Labs    11/12/17 0102  11/13/17 0551 11/13/17 1634 11/14/17 0536 11/14/17 2333  HGB 9.3*  --  10.0*  --  10.5*  --   HCT 27.5*  --  30.0*  --  31.7*  --   PLT 111*  --  106*  --  108*  --   APTT  --    < > 109* 86* 80*  --   HEPARINUNFRC  --   --  0.92*  --  0.62 0.45  CREATININE  --   --  0.64  --  0.55  --    < > = values in this interval not displayed.    Estimated Creatinine Clearance: 53.7 mL/min (by C-G formula based on SCr of 0.55 mg/dL).  PRN: albuterol, ALPRAZolam, ondansetron (ZOFRAN) IV, oxyCODONE, sodium chloride flush  Assessment: 84 yo female with ovarian cancer currently undergoing chemotherapy treatment and hx afib on xarelto PTA, presented to the ED on 11/08/2017 with c/o tachycardia, cough and congestion. Xarelto is now on hold for thoracentesis planned on Thursday and Pharmacy is consulted to dose IV heparin as bridge therapy.  Last dose of Xarelto was given 2/3 at 17:16 so heparin will not need to start until tonight 24hr after.   2/7  APTT = 80 sec ( therapeutic).  Heparin level also therapeutic ato 0.62. CBC: Hgb stable, pltc decreased but stable  2333 HL=0.45 at goal, no bleeding or infusion issues per RN.  Goal of Therapy:  Heparin level 0.3-0.7 units/ml Monitor platelets by anticoagulation protocol: Yes    Plan:   Continue heparin drip at 1150 units/hr  Recheck HL in 8 hours  Daily heparin level, CBC,(note low platelets s/p chemo)  F/u when to resume  xarelto   Dorrene German 11/15/2017 12:50 AM

## 2017-11-16 LAB — CHOLESTEROL, BODY FLUID: CHOL FL: 76 mg/dL

## 2017-11-16 LAB — RHEUMATOID FACTORS, FLUID: Rheumatoid Arthritis, Qn/Fluid: NEGATIVE

## 2017-11-18 ENCOUNTER — Encounter (HOSPITAL_COMMUNITY): Payer: Self-pay

## 2017-11-18 ENCOUNTER — Ambulatory Visit (HOSPITAL_COMMUNITY): Payer: Medicare Other

## 2017-11-18 LAB — BODY FLUID CULTURE
CULTURE: NO GROWTH
GRAM STAIN: NONE SEEN

## 2017-11-19 LAB — ADENOSIDE DEAMINASE, PLEURAL FL: ADENOSIDE DEAMINASE, PLEURAL FL: <1.6 U/L (ref 0.0–9.4)

## 2017-11-20 ENCOUNTER — Encounter (HOSPITAL_COMMUNITY): Payer: Self-pay

## 2017-11-20 ENCOUNTER — Ambulatory Visit (HOSPITAL_COMMUNITY): Payer: Self-pay

## 2017-11-20 ENCOUNTER — Other Ambulatory Visit (HOSPITAL_COMMUNITY): Payer: Self-pay

## 2017-11-20 ENCOUNTER — Ambulatory Visit (HOSPITAL_COMMUNITY): Payer: Medicare Other | Admitting: Occupational Therapy

## 2017-11-20 ENCOUNTER — Ambulatory Visit (HOSPITAL_COMMUNITY): Payer: Medicare Other

## 2017-11-21 ENCOUNTER — Telehealth: Payer: Self-pay | Admitting: Emergency Medicine

## 2017-11-21 NOTE — Telephone Encounter (Signed)
Spoke with [name], she is aware of results. Nothing else needed at time of call.

## 2017-11-21 NOTE — Telephone Encounter (Signed)
Called and spoke with pt's daughter, [name] [facility]) who stated she was wanting to know the results of the thoracentesis pt had done.  Dr. Lamonte Sakai, please advise on this.  Thanks!

## 2017-11-21 NOTE — Telephone Encounter (Signed)
Please let her know that her cytology shows some atypical cells, but no overt cancer cells. Also no evidence of infection. This is good news.

## 2017-11-22 ENCOUNTER — Encounter (HOSPITAL_COMMUNITY): Payer: Self-pay

## 2017-11-25 ENCOUNTER — Encounter (HOSPITAL_COMMUNITY): Payer: Self-pay | Admitting: Specialist

## 2017-11-25 ENCOUNTER — Encounter (HOSPITAL_COMMUNITY): Payer: Self-pay | Admitting: Physical Therapy

## 2017-11-27 ENCOUNTER — Ambulatory Visit (HOSPITAL_COMMUNITY): Payer: Medicare Other

## 2017-11-27 ENCOUNTER — Encounter (HOSPITAL_COMMUNITY): Payer: Self-pay | Admitting: Physical Therapy

## 2017-11-28 ENCOUNTER — Encounter (HOSPITAL_COMMUNITY): Payer: Self-pay

## 2017-11-28 ENCOUNTER — Ambulatory Visit (HOSPITAL_COMMUNITY)
Admission: RE | Admit: 2017-11-28 | Discharge: 2017-11-28 | Disposition: A | Discharge status: Home / Self Care | Payer: Medicare Other | Source: Ambulatory Visit | Attending: Hematology and Oncology | Admitting: Hematology and Oncology

## 2017-11-28 DIAGNOSIS — J90 Pleural effusion, not elsewhere classified: Secondary | ICD-10-CM | POA: Insufficient documentation

## 2017-11-28 DIAGNOSIS — I700 Atherosclerosis of aorta: Secondary | ICD-10-CM | POA: Diagnosis not present

## 2017-11-28 DIAGNOSIS — C561 Malignant neoplasm of right ovary: Secondary | ICD-10-CM | POA: Diagnosis present

## 2017-11-28 DIAGNOSIS — K862 Cyst of pancreas: Secondary | ICD-10-CM | POA: Diagnosis not present

## 2017-11-28 MED ORDER — HEPARIN SOD (PORK) LOCK FLUSH 100 UNIT/ML IV SOLN
INTRAVENOUS | Status: AC
  Filled 2017-11-28: qty 5

## 2017-11-28 MED ORDER — HEPARIN SOD (PORK) LOCK FLUSH 100 UNIT/ML IV SOLN
500.0000 [IU] | Freq: Once | INTRAVENOUS | Status: AC
  Administered 2017-11-28: 500 [IU] via INTRAVENOUS

## 2017-11-28 MED ORDER — IOPAMIDOL (ISOVUE-300) INJECTION 61%
INTRAVENOUS | Status: AC
  Administered 2017-11-28: 100 mL via INTRAVENOUS
  Filled 2017-11-28: qty 100

## 2017-11-28 MED ORDER — IOPAMIDOL (ISOVUE-300) INJECTION 61%
100.0000 mL | Freq: Once | INTRAVENOUS | Status: AC | PRN
  Administered 2017-11-28: 100 mL via INTRAVENOUS

## 2017-11-29 ENCOUNTER — Encounter (HOSPITAL_COMMUNITY): Payer: Self-pay | Admitting: Occupational Therapy

## 2017-11-29 ENCOUNTER — Encounter (HOSPITAL_COMMUNITY): Payer: Self-pay | Admitting: Physical Therapy

## 2017-12-02 ENCOUNTER — Encounter (HOSPITAL_COMMUNITY): Payer: Self-pay | Admitting: Physical Therapy

## 2017-12-02 ENCOUNTER — Encounter (HOSPITAL_COMMUNITY): Payer: Self-pay

## 2017-12-02 NOTE — Progress Notes (Signed)
Cardiology Office Note  Date: 12/03/2017   ID: [name], DOB 08-17-1933, MRN [id number]  PCP: Asencion Noble, MD  Primary Cardiologist: Rozann Lesches, MD   Chief Complaint  Patient presents with  . PAF  . Diastolic heart failure    History of Present Illness: [name] is an 84 y.o. female last seen by Ms. Vita Barley in December 2018.  At that time she was showing significant improvement in terms of fluid weight loss on diuretics with acute on chronic diastolic heart failure.  More recently she was admitted to the hospital with pneumonia and recurrent atrial fibrillation, underwent thoracentesis as well.  She is now undergoing rehabilitation at the [facility].  She is here today with a family member for follow-up.  Reports that she is feeling better, no palpitations or chest pain, breathing has improved significantly, although she still is on supplemental oxygen.  We went over her medications which include Cartia XT, Lasix, potassium supplements, and Xarelto.  She does not report any bleeding problems.  I reviewed her most recent lab work from around the time of discharge in early February.  Her weight is down overall.  Echocardiogram from November 2018 revealed LVEF 60-65% range.   She has ovarian cancer and is undergone treatment with chemotherapy, ultimately with plan for surgery.  She reports a pending visit with her surgeon in the near future, may undergo surgery if she is adequately rehabilitated by late March.  Past Medical History:  Diagnosis Date  . COPD (chronic obstructive pulmonary disease) ([place])   . Depression   . Diverticulosis   . Family hx of colon cancer   . Fibromyalgia   . GERD (gastroesophageal reflux disease)   . Hemorrhoids   . Hiatal hernia   . History of shingles   . Hypothyroidism   . IBS (irritable bowel syndrome)   . Lymphocytic colitis   . Osteoporosis   . Ovarian cancer ([place])   . Paroxysmal atrial fibrillation (HCC)   . Psoriatic  arthritis ([employer])   . Rectal polyp   . Schatzki's ring   . Uterine polyp     Past Surgical History:  Procedure Laterality Date  . CHOLECYSTECTOMY  Dec 04, 2010  . COLONOSCOPY    . FEMUR IM NAIL Left 12/11/2013   Procedure: INTRAMEDULLARY (IM) NAIL FEMORAL;  Surgeon: [name], MD;  Location: [facility]S;  Service: Orthopedics;  Laterality: Left;  . FRACTURE SURGERY Left 12/11/2013   hip  . IR FLUORO GUIDE PORT INSERTION RIGHT  07/22/2017  . IR PARACENTESIS  07/12/2017  . IR US GUIDE VASC ACCESS RIGHT  07/22/2017  . KNEE SURGERY Left    x 2  . TOTAL KNEE REVISION  08/06/2012   Procedure: TOTAL KNEE REVISION;  Surgeon: [name], MD;  Location: [facility];  Service: Orthopedics;  Laterality: Left;  Left Total Knee Arthroplasty Revision  . Uterine polypectomy      Current Outpatient Medications  Medication Sig Dispense Refill  . albuterol (PROAIR HFA) 108 (90 Base) MCG/ACT inhaler Inhale 2 puffs into the lungs every 6 (six) hours as needed for wheezing or shortness of breath. 1 Inhaler 3  . ALPRAZolam (XANAX) 0.25 MG tablet Take 1 tablet (0.25 mg total) by mouth at bedtime as needed for anxiety. 5 tablet 0  . azelastine (ASTELIN) 0.1 % nasal spray Place 2 sprays into both nostrils 2 (two) times daily as needed for allergies.     . budesonide-formoterol (SYMBICORT) 160-4.5 MCG/ACT  inhaler Inhale 2 puffs into the lungs 2 (two) times daily. 1 Inhaler 3  . Cholecalciferol (VITAMIN D) 2000 units CAPS Take 2,000 Units by mouth daily.    Marland Kitchen diltiazem (CARTIA XT) 120 MG 24 hr capsule TAKE 1 CAPSULE BY MOUTH EVERY DAY 90 capsule 3  . diphenoxylate-atropine (LOMOTIL) 2.5-0.025 MG tablet Take 1 tablet by mouth 4 (four) times daily. (Patient taking differently: Take 1 tablet by mouth 4 (four) times daily as needed for diarrhea or loose stools. ) 360 tablet 1  . fluticasone (FLONASE) 50 MCG/ACT nasal spray Place 2 sprays into both nostrils 2 (two) times daily. For nasal congestion    . furosemide  (LASIX) 20 MG tablet Take 1 tablet (20 mg total) by mouth daily. 90 tablet 9  . ipratropium-albuterol (DUONEB) 0.5-2.5 (3) MG/3ML SOLN Take 3 mLs by nebulization 3 (three) times daily. 360 mL   . levothyroxine (SYNTHROID, LEVOTHROID) 137 MCG tablet Take 137 mcg by mouth every morning. For thyroid therapy    . lidocaine-prilocaine (EMLA) cream Apply 1 application topically as needed. 30 g 6  . loperamide (IMODIUM) 2 MG capsule Take 2 capsules (4 mg total) by mouth 3 (three) times daily. (Patient taking differently: Take 4 mg by mouth as needed for diarrhea or loose stools. ) 30 capsule 0  . magic mouthwash w/lidocaine SOLN Take 5 mLs by mouth 4 (four) times daily. 5 ml QID swish and spit (Patient taking differently: Take 5 mLs by mouth 4 (four) times daily as needed for mouth pain. 5 ml QID swish and spit) 240 mL 0  . methylcellulose (ARTIFICIAL TEARS) 1 % ophthalmic solution Place 1 drop into both eyes 2 (two) times daily as needed (for dry eyes).     . mirtazapine (REMERON) 15 MG tablet Take 1 tablet (15 mg total) by mouth at bedtime. 30 tablet 1  . ondansetron (ZOFRAN) 8 MG tablet Take 1 tablet (8 mg total) by mouth every 8 (eight) hours as needed for nausea or vomiting. 60 tablet 0  . Oxycodone HCl 10 MG TABS Take 1 tablet (10 mg total) by mouth every 4 (four) hours as needed. 20 tablet 0  . polyethylene glycol (MIRALAX / GLYCOLAX) packet Take 17 g by mouth daily. 14 each 0  . prochlorperazine (COMPAZINE) 10 MG tablet Take 10 mg by mouth every 6 (six) hours as needed for nausea.  1  . senna-docusate (SENOKOT-S) 8.6-50 MG tablet Take 1 tablet by mouth at bedtime. 30 tablet 0  . XARELTO 20 MG TABS tablet TAKE 1 TABLET BY MOUTH EVERY DAY WITH SUPPER 90 tablet 0   No current facility-administered medications for this visit.    Allergies:  Codeine   Social History: The patient  reports that she quit smoking about 37 years ago. Her smoking use included cigarettes. She has a 20.00 pack-year smoking  history. she has never used smokeless tobacco. She reports that she drinks alcohol. She reports that she does not use drugs.   ROS:  Please see the history of present illness. Otherwise, complete review of systems is positive for improving weakness.  All other systems are reviewed and negative.   Physical Exam: VS:  BP 110/60 (BP Location: Right Arm)   Pulse 89   Ht 5' 4"  (1.626 m)   Wt 159 lb (72.1 kg)   SpO2 97%   BMI 27.29 kg/m , BMI Body mass index is 27.29 kg/m.  Wt Readings from Last 3 Encounters:  12/03/17 159 lb (72.1 kg)  11/08/17  164 lb 14.5 oz (74.8 kg)  11/04/17 163 lb 9.6 oz (74.2 kg)    General: Frail-appearing elderly woman in wheelchair wearing oxygen via nasal cannula.  HEENT: Conjunctiva and lids normal, oropharynx clear. Neck: Supple, no elevated JVP or carotid bruits, no thyromegaly. Lungs: Decreased breath sounds at the bases, nonlabored breathing at rest. Cardiac: Regular rate and rhythm, no S3, soft systolic murmur. Abdomen: Soft, nontender, bowel sounds present. Extremities: Trace ankle edema, distal pulses 1-2+. Skin: Warm and dry. Musculoskeletal: Kyphosis noted. Neuropsychiatric: Alert and oriented x3, affect grossly appropriate.  ECG: I personally reviewed the tracing from 11/08/2017 which showed sinus tachycardia, low voltage, possible limb lead reversal.  Recent Labwork: 08/12/2017: NT-Pro BNP 837 11/10/2017: TSH 0.388 11/12/2017: B Natriuretic Peptide 76.7 11/15/2017: ALT 15; AST 15; BUN 12; Creatinine, Ser 0.55; Hemoglobin 9.6; Magnesium 1.3; Platelets 111; Potassium 4.6; Sodium 133     Component Value Date/Time   CHOL 147 11/14/2017 1413    Other Studies Reviewed Today:  Echocardiogram 08/20/2017: Study Conclusions  - Left ventricle: The cavity size was normal. Wall thickness was   normal. Systolic function was normal. The estimated ejection   fraction was in the range of 60% to 65%. Wall motion was normal;   there were no regional wall  motion abnormalities. Doppler   parameters are consistent with abnormal left ventricular   relaxation (grade 1 diastolic dysfunction). - Aortic valve: There was no stenosis. There was trivial   regurgitation. - Mitral valve: Mildly calcified annulus. There was no significant   regurgitation. - Right ventricle: The cavity size was normal. Systolic function   was normal. - Tricuspid valve: Peak RV-RA gradient (S): 27 mm Hg. - Pulmonary arteries: PA peak pressure: 30 mm Hg (S). - Inferior vena cava: The vessel was normal in size. The   respirophasic diameter changes were in the normal range (= 50%),   consistent with normal central venous pressure.  Impressions:  - Normal LV size with EF 60-65%. Normal RV size and systolic   function. No significant valvular abnormalities.  Assessment and Plan:  1.  Paroxysmal atrial fibrillation, currently maintaining sinus rhythm.  Plan is to continue Xarelto for stroke prophylaxis and also Cartia XT.  Recent lab work reviewed.  2.  Chronic diastolic heart failure, fluid status looks to be much improved at this point although she remains prone to exacerbation in light of complex comorbidities.  She is tolerating low-dose Lasix with potassium supplements, follow-up BMET for next visit.  Weight is down about 5 pounds compared to early February.  3.  Ovarian cancer status post chemotherapy and with reported plan for surgical management ultimately.  4.  COPD, follows with Dr. Willey Blade.  Current medicines were reviewed with the patient today.  Disposition: Follow-up in 1 month, can weigh in on surgical clearance at that time.  Signed, Satira Sark, MD, [facility] 12/03/2017 11:14 AM    [facility] [address] [place], [place] [address] Phone: [phone]; Fax: [phone]

## 2017-12-03 ENCOUNTER — Encounter (HOSPITAL_COMMUNITY)
Admission: RE | Admit: 2017-12-03 | Discharge: 2017-12-03 | Disposition: A | Discharge status: Home / Self Care | Payer: No Typology Code available for payment source | Source: Ambulatory Visit | Attending: Internal Medicine | Admitting: Internal Medicine

## 2017-12-03 ENCOUNTER — Ambulatory Visit (INDEPENDENT_AMBULATORY_CARE_PROVIDER_SITE_OTHER): Payer: Medicare Other | Admitting: Cardiology

## 2017-12-03 ENCOUNTER — Encounter: Payer: Self-pay | Admitting: Cardiology

## 2017-12-03 VITALS — BP 110/60 | HR 89 | Ht 64.0 in | Wt 159.0 lb

## 2017-12-03 DIAGNOSIS — I480 Paroxysmal atrial fibrillation: Secondary | ICD-10-CM

## 2017-12-03 DIAGNOSIS — C569 Malignant neoplasm of unspecified ovary: Secondary | ICD-10-CM

## 2017-12-03 DIAGNOSIS — M810 Age-related osteoporosis without current pathological fracture: Secondary | ICD-10-CM | POA: Diagnosis present

## 2017-12-03 DIAGNOSIS — I5032 Chronic diastolic (congestive) heart failure: Secondary | ICD-10-CM | POA: Diagnosis not present

## 2017-12-03 DIAGNOSIS — J449 Chronic obstructive pulmonary disease, unspecified: Secondary | ICD-10-CM | POA: Diagnosis not present

## 2017-12-03 MED ORDER — DENOSUMAB 60 MG/ML ~~LOC~~ SOLN
60.0000 mg | Freq: Once | SUBCUTANEOUS | Status: AC
  Administered 2017-12-03: 60 mg via SUBCUTANEOUS
  Filled 2017-12-03: qty 1

## 2017-12-03 NOTE — Patient Instructions (Signed)
Your physician recommends that you schedule a follow-up appointment in: 1 month with Bernerd Pho  PA-C   Please get lab work : BMET a few days before next visit     Your physician recommends that you continue on your current medications as directed. Please refer to the Current Medication list given to you today..    If you need a refill on your cardiac medications before your next appointment, please call your pharmacy.      No tests ordered today.       Thank you for choosing [facility] !

## 2017-12-04 ENCOUNTER — Encounter (HOSPITAL_COMMUNITY): Payer: Self-pay | Admitting: Physical Therapy

## 2017-12-04 ENCOUNTER — Ambulatory Visit (INDEPENDENT_AMBULATORY_CARE_PROVIDER_SITE_OTHER): Payer: Medicare Other | Admitting: Emergency Medicine

## 2017-12-04 ENCOUNTER — Encounter (HOSPITAL_COMMUNITY): Payer: Self-pay | Admitting: Occupational Therapy

## 2017-12-04 ENCOUNTER — Encounter: Payer: Self-pay | Admitting: Emergency Medicine

## 2017-12-04 DIAGNOSIS — J431 Panlobular emphysema: Secondary | ICD-10-CM | POA: Diagnosis not present

## 2017-12-04 DIAGNOSIS — J90 Pleural effusion, not elsewhere classified: Secondary | ICD-10-CM

## 2017-12-04 NOTE — Assessment & Plan Note (Signed)
She is tolerating Symbicort.  I question whether she may benefit more from a LABA / LAMA.  She has not tolerated powdered medications before, I will try Stiolto.  bevespi be would be an option at some point in the future as well.   Based on her overall clinical status, underlying pulmonary disease, she is at moderately increased risk for major surgery, general anesthesia.  This does not preclude her abdominal surgery if the benefits outweigh these risks.  She does want to proceed and I believe that there is no absolute contraindication..   Please stop Symbicort for now Start Stiolto 2 sprays once a day. If you benefit from this change then we will continue this med Continue your oxygen at 2L/min Your lung disease, heart disease and cancer to increase your risk for surgery and general anesthesia.  Given your overall improvement and your current baseline I do not believe there is anything to preclude you proceeding with your operation as planned. Follow with Dr Lamonte Sakai in 3 months or sooner if you have any problems.

## 2017-12-04 NOTE — Progress Notes (Signed)
Subjective:    Patient ID: RALPHINE HINKS, female    DOB: 10/12/1932, 84 y.o.   MRN: [id number] HPI ROV 05/28/16 -- Patient follows up today for her history of COPD, chronic rhinitis, chronic cough. Also with a history of psoriatic arthritis And former methotrexate use. At our last visit we decided to retry Anoro to see if she would benefit. She has noticed some increase in cough w the medication. She is not sure that it is helping her breathing very much.  She coughed with spiriva handihaler also. Tolerated spiriva respimat. Has been on xopenex in past, no longer paid for.   ROV 08/06/16 -- This follow-up visit for patient with a history of COPD, chronic rhinitis, chronic cough and psoriatic arthritis. She has been on methotrexate in the past but not currently. Has tried multiple bronchodilators but has had side effects particularly side effect of cough. We stopped Advair, started symbicort that was then substituted with Dulera. She feels that the sym bicort worked better for her - opened her up better. Throat irritation is better. She is still having chronic rhinitis, more that normal, more cough than normal. She is on flonase qd and astelin.  She uses albuterol less than before. Helps her breathing.  ROV 11/22/16 -- This is a follow-up visit for patient with a history of COPD psoriatic arthritis on methotrexate in the past (not currently), chronic rhinitis and chronic cough. She reports that she has been dealing with a URI since about 9 days ago. She was treated with empiric doxy, also w tamiflu. Still coughing up clear to white. She has more wheeze. She had to stop symbicort, restarted it in January when her insurance changed. Remains on flonase and atrovent Ns bid she uses albuterol about once a day (more than usual).   ROV 04/01/17 -- Patient has a history of COPD, psoriatic arthritis previously on methotrexate, chronic rhinitis, chronic cough. She reports that she has been having trouble with back  pain, DDD. Has been quite limited since last visit due to this. Her breathing has been fairly stable - she has been using Anoro, but believes that the Symbicort is better, so she wants to change back. She is using albuterol rarely. She is not using nasal steroid or Atrovent NS right now.       ROV 12/04/17 --84 year old woman with a history of psoriatic arthritis (previously on methotrexate), COPD, chronic rhinitis and chronic cough.   Seen her she is been undergoing treatment for ovarian cancer.  She also has a history of hypertension, A fib and newly diagnosed diastolic dysfunction (on xarelto).    She has been admitted twice with dyspnea that responded to diuresis, paracentesis and thoracentesis.  Most recently she had R thoracentesis done on 11/14/17 > atypical cells but no CA cells. Underwent Ct chest 11/28/17 that I reviewed > small B effusions, L > R. Her activity level is improving with rehab. She is being considered for abdominal sgy > TAH / BSO + omentum.   She is on symbicort.   Has duoneb available. She was on duoneb qid for a while but didn't think it was better than symbicort.  Objective:   Physical Exam Vitals:   12/04/17 1447  BP: 116/62  Pulse: 95  SpO2: 95%  Weight: 158 lb 6.4 oz (71.8 kg)  Height: 5' 4"  (1.626 m)   Gen: Pleasant, overwt, in no distress,  normal affect  ENT: No lesions,  mouth clear,  oropharynx clear, no postnasal drip  Neck: No JVD, no TMG, no carotid bruits  Lungs: No use of accessory muscles, no wheezes  Cardiovascular: RRR, heart sounds normal, no murmur or gallops, no peripheral edema  Musculoskeletal: No deformities, no cyanosis or clubbing  Neuro: alert, non focal  Skin: Warm, no lesions or rashes   Assessment & Plan:  COPD (chronic obstructive pulmonary disease) ([place]) She is  tolerating Symbicort.  I question whether she may benefit more from a LABA / LAMA.  She has not tolerated powdered medications before, I will try Stiolto.  bevespi be would be an option at some point in the future as well.   Based on her overall clinical status, underlying pulmonary disease, she is at moderately increased risk for major surgery, general anesthesia.  This does not preclude her abdominal surgery if the benefits outweigh these risks.  She does want to proceed and I believe that there is no absolute contraindication..   Please stop Symbicort for now Start Stiolto 2 sprays once a day. If you benefit from this change then we will continue this med Continue your oxygen at 2L/min Your lung disease, heart disease and cancer to increase your risk for surgery and general anesthesia.  Given your overall improvement and your current baseline I do not believe there is anything to preclude you proceeding with your operation as planned. Follow with Dr Lamonte Sakai in 3 months or sooner if you have any problems.  Pleural effusion on right This does not appear to have significantly reaccumulated based on her CT scan of the chest on 11/28/17.  Suspect it was caused by her overall volume overload, diastolic dysfunction, nutritional status in the setting of her malignancy.  Cytology showed atypical cells but nothing diagnostic for ovarian cancer.  Baltazar Apo, MD, PhD 12/04/2017, 3:29 PM [facility] [phone] or if no answer [phone]

## 2017-12-04 NOTE — Patient Instructions (Signed)
Please stop Symbicort for now Start Stiolto 2 sprays once a day. If you benefit from this change then we will continue this med Continue your oxygen at 2L/min Your lung disease, heart disease and cancer to increase your risk for surgery and general anesthesia.  Given your overall improvement and your current baseline I do not believe there is anything to preclude you proceeding with your operation as planned. Follow with Dr Lamonte Sakai in 3 months or sooner if you have any problems.

## 2017-12-04 NOTE — Assessment & Plan Note (Signed)
This does not appear to have significantly reaccumulated based on her CT scan of the chest on 11/28/17.  Suspect it was caused by her overall volume overload, diastolic dysfunction, nutritional status in the setting of her malignancy.  Cytology showed atypical cells but nothing diagnostic for ovarian cancer.

## 2017-12-05 ENCOUNTER — Telehealth: Payer: Self-pay

## 2017-12-05 ENCOUNTER — Encounter: Payer: Self-pay | Admitting: Gynecologic Oncology

## 2017-12-05 ENCOUNTER — Other Ambulatory Visit: Payer: Self-pay | Admitting: Hematology and Oncology

## 2017-12-05 ENCOUNTER — Inpatient Hospital Stay
Payer: No Typology Code available for payment source | Attending: Hematology and Oncology | Admitting: Gynecologic Oncology

## 2017-12-05 VITALS — BP 127/63 | HR 91 | Temp 98.4°F | Resp 20 | Wt 158.0 lb

## 2017-12-05 DIAGNOSIS — C561 Malignant neoplasm of right ovary: Secondary | ICD-10-CM | POA: Insufficient documentation

## 2017-12-05 DIAGNOSIS — I480 Paroxysmal atrial fibrillation: Secondary | ICD-10-CM | POA: Diagnosis not present

## 2017-12-05 DIAGNOSIS — K449 Diaphragmatic hernia without obstruction or gangrene: Secondary | ICD-10-CM | POA: Insufficient documentation

## 2017-12-05 DIAGNOSIS — R188 Other ascites: Secondary | ICD-10-CM | POA: Insufficient documentation

## 2017-12-05 DIAGNOSIS — J449 Chronic obstructive pulmonary disease, unspecified: Secondary | ICD-10-CM | POA: Diagnosis not present

## 2017-12-05 DIAGNOSIS — M810 Age-related osteoporosis without current pathological fracture: Secondary | ICD-10-CM | POA: Insufficient documentation

## 2017-12-05 DIAGNOSIS — M797 Fibromyalgia: Secondary | ICD-10-CM | POA: Diagnosis not present

## 2017-12-05 DIAGNOSIS — Z801 Family history of malignant neoplasm of trachea, bronchus and lung: Secondary | ICD-10-CM

## 2017-12-05 DIAGNOSIS — Z800 Family history of malignant neoplasm of digestive organs: Secondary | ICD-10-CM

## 2017-12-05 DIAGNOSIS — E039 Hypothyroidism, unspecified: Secondary | ICD-10-CM | POA: Insufficient documentation

## 2017-12-05 DIAGNOSIS — K589 Irritable bowel syndrome without diarrhea: Secondary | ICD-10-CM | POA: Insufficient documentation

## 2017-12-05 DIAGNOSIS — F329 Major depressive disorder, single episode, unspecified: Secondary | ICD-10-CM | POA: Insufficient documentation

## 2017-12-05 DIAGNOSIS — L4050 Arthropathic psoriasis, unspecified: Secondary | ICD-10-CM | POA: Diagnosis not present

## 2017-12-05 DIAGNOSIS — R634 Abnormal weight loss: Secondary | ICD-10-CM | POA: Insufficient documentation

## 2017-12-05 DIAGNOSIS — K219 Gastro-esophageal reflux disease without esophagitis: Secondary | ICD-10-CM | POA: Diagnosis not present

## 2017-12-05 DIAGNOSIS — J90 Pleural effusion, not elsewhere classified: Secondary | ICD-10-CM | POA: Diagnosis not present

## 2017-12-05 DIAGNOSIS — Z87891 Personal history of nicotine dependence: Secondary | ICD-10-CM | POA: Diagnosis not present

## 2017-12-05 DIAGNOSIS — C569 Malignant neoplasm of unspecified ovary: Secondary | ICD-10-CM

## 2017-12-05 DIAGNOSIS — Z9221 Personal history of antineoplastic chemotherapy: Secondary | ICD-10-CM | POA: Diagnosis not present

## 2017-12-05 DIAGNOSIS — R630 Anorexia: Secondary | ICD-10-CM | POA: Diagnosis not present

## 2017-12-05 MED ORDER — DEXAMETHASONE 4 MG PO TABS: 8.0000 mg | ORAL_TABLET | Freq: Every day | ORAL | 1 refills | Status: DC

## 2017-12-05 MED ORDER — PROCHLORPERAZINE MALEATE 10 MG PO TABS
10.0000 mg | ORAL_TABLET | Freq: Four times a day (QID) | ORAL | 1 refills | Status: DC | PRN
Start: 2017-12-05 — End: 2018-02-19

## 2017-12-05 MED ORDER — LIDOCAINE-PRILOCAINE 2.5-2.5 % EX CREA: TOPICAL_CREAM | CUTANEOUS | 3 refills | Status: DC

## 2017-12-05 MED ORDER — ONDANSETRON HCL 8 MG PO TABS: 8.0000 mg | ORAL_TABLET | Freq: Two times a day (BID) | ORAL | 1 refills | Status: DC | PRN

## 2017-12-05 NOTE — Telephone Encounter (Signed)
Daughter called. Dr. Alvy Bimler sent in 4 prescriptions to [facility] today. Her Mom is currently in [place] and rehab and needs orders sent to nursing home. Daughter states that she has those rx's at home.

## 2017-12-05 NOTE — Progress Notes (Signed)
ON PATHWAY REGIMEN - Ovarian  No Change  Continue With Treatment as Ordered.     A cycle is every 21 days:     Paclitaxel      Carboplatin   **Always confirm dose/schedule in your pharmacy ordering system**    Patient Characteristics: Newly Diagnosed, Neoadjuvant Therapy AJCC T Category: T3 AJCC N Category: N1 AJCC M Category: M1 Therapeutic Status: Newly Diagnosed, Neoadjuvant Therapy AJCC 8 Stage Grouping: IV Intent of Therapy: Curative Intent, Discussed with Patient

## 2017-12-05 NOTE — Patient Instructions (Signed)
Plan to follow up with Dr Skeet Latch on March 26 (see appointment below)  And someone will call you with an appointment with Dr Alvy Bimler.

## 2017-12-05 NOTE — Progress Notes (Signed)
GYN ONCOLOGY OFFICE VISIT     [name] 84 y.o. female  CC:  Chief Complaint  Patient presents with  . Malignant neoplasm of ovary, unspecified laterality ([place])     Assessment/Plan: The patient is clearly having a response to current chemotherapy based on the significant fall in her Ca1 25 value as well as considerable improvement of her CT scan findings.  The question as to whether interval debulking would be appropriate at this time is raised.  At this time she is in rehab and is in a wheelchair my concern is her very high risk for surgical complications especially given her poor pulmonary status and poor functional status.  Concern is that surgical intervention at this time even with the improvement in her disease burden would worsen her quality of life.  She reports that the chemotherapy has markedly worsened her quality of life further she has used up her nursing home time and any further time in the nursing home will be at the social and financial expense of the family.  The daughter and [name] understand this concern but remains frustrated and disappointed.  Long discussion was held regarding goal of cure which is unreasonable, and quality of life.  At the termination of this discussion it was determined that we would work to optimize her physical status while improving her quality of life.  Dr. Lottie Rater will present options such as single agent carboplatin or a park inhibitor after genetic testing is continued.    If the disease burden remains stable or decreases and her performance status is improved we will rediscuss surgical intervention.  Follow-up December 31, 2017 and January 16, 2018 35-minute discussion  HPI:     Ovarian CA, right ([place])   02/18/2016 Tumor Marker    Patient's tumor was tested for the following markers: CA125 Results of the tumor marker test revealed 45      05/22/2016 Tumor Marker    Patient's tumor was tested for the following markers:  CA125 Results of the tumor marker test revealed 53      05/22/2016 Imaging    Outside pelvic US showed 4.1 cm adnexa mass      06/24/2017 Imaging    Ct abdomen and pelvis:  1. Interim finding of moderate ascites within the abdomen and pelvis with additional finding of diffuse nodular infiltration of the omentum and anterior mesenteric fat, the appearance would be consistent with peritoneal carcinomatosis/metastatic disease. Increasing retroperitoneal and upper abdominal adenopathy. 2. Re- demonstrated 3.8 cm cyst in the right adnexa. Enlarging soft tissue density in the left adnexa now with possible cystic component posteriorly. In light of the above findings, concern is for ovarian neoplasm. Correlation with pelvic ultrasound recommended. 3. Small right-sided pleural effusion, new since prior study 4. Stable hypodense splenic lesions since 2017.       06/25/2017 Imaging    US pelvis: 2.9 cm simple appearing cyst in the right ovary. Left ovary grossly unremarkable. Large volume ascites in the pelvis      06/30/2017 - 07/01/2017 Hospital Admission    She was admitted for evaluation of abdominal pain and ascites      07/01/2017 Pathology Results    PERITONEAL/ASCITIC FLUID(SPECIMEN 1 OF 1 COLLECTED 07/01/17): - POORLY DIFFERENTIATED CARCINOMA; SEE COMMENT Source Peritoneal/Ascitic Fluid, (specimen 1 of 1 collected 07/01/17) Gross Specimen: Received is/are 1000 cc's of brownish fluid. (BS:bs) Prepared: # Smears: 0 # Concentration Technique Slides (i.e. ThinPrep): 1 # Cell Block: 1 Additional Studies: Also received Hematology  slide - [id number]. Comment The tumor cells are positive for cytokeratin 7 and Pax-8 but negative for cytokeratin 20, CDX-2, GATA-3, Napsin-A and TTF-1. Based on the immunoprofile a gynecology primary is favored      07/01/2017 Procedure    Successful ultrasound-guided diagnostic and therapeutic paracentesis yielding 2.5 liters of peritoneal fluid      07/07/2017 -  07/09/2017 Hospital Admission    She was admitted for management of malignant ascites      07/08/2017 Procedure    Successful ultrasound-guided therapeutic paracentesis yielding 2.7 liters liters of peritoneal fluid      07/12/2017 Procedure    Successful ultrasound-guided paracentesis yielding 1450 mL of peritoneal fluid      07/18/2017 - 07/24/2017 Hospital Admission    She was admitted for expedited treatment      07/18/2017 Tumor Marker    Patient's tumor was tested for the following markers: CA125 Results of the tumor marker test revealed 1941      07/19/2017 -  Chemotherapy    The patient had palonosetron (ALOXI) injection 0.25 mg, 0.25 mg, Intravenous,  Once, 1 of 6 cycles  pegfilgrastim (NEULASTA) injection 6 mg, 6 mg, Subcutaneous,  Once, 1 of 6 cycles  CARBOplatin (PARAPLATIN) 490 mg in sodium chloride 0.9 % 250 mL chemo infusion, 490 mg (100 % of original dose 487.2 mg), Intravenous,  Once, 1 of 6 cycles Dose modification:   (original dose 487.2 mg, Cycle 1)  PACLitaxel (TAXOL) 336 mg in dextrose 5 % 500 mL chemo infusion (> 55m/m2), 175 mg/m2 = 336 mg, Intravenous,  Once, 1 of 6 cycles  fosaprepitant (EMEND) 150 mg, dexamethasone (DECADRON) 12 mg in sodium chloride 0.9 % 145 mL IVPB, , Intravenous,  Once, 1 of 6 cycles  for chemotherapy treatment.        08/06/2017 Procedure    Successful ultrasound-guided therapeutic paracentesis yielding 2.6 liters of peritoneal fluid.      08/09/2017 Tumor Marker    Patient's tumor was tested for the following markers: CA125 Results of the tumor marker test revealed 1665      08/15/2017 Tumor Marker    Patient's tumor was tested for the following markers: CA125 Results of the tumor marker test revealed 937.9      08/20/2017 Imaging    ECHO: Normal LV size with EF 60-65%. Normal RV size and systolic function. No significant valvular abnormalities.      09/18/2017 Imaging    Chest Impression:  1. No evidence thoracic  metastasis. 2. Interval increase and RIGHT pleural effusion.  Abdomen / Pelvis Impression:  1. Interval decrease in intraperitoneal free fluid. 2. Interval decrease in omental nodularity in the LEFT ventral peritoneal space. 3. Interval decrease in nodularity associated with the LEFT ovary. 4. Cystic portion of the RIGHT ovary is increased mildly in size.      09/20/2017 Tumor Marker    Patient's tumor was tested for the following markers: CA125 Results of the tumor marker test revealed 347      10/14/2017 Tumor Marker    Patient's tumor was tested for the following markers: CA125 Results of the tumor marker test revealed 307.4      11/04/2017 Tumor Marker    Patient's tumor was tested for the following markers: CA125 Results of the tumor marker test revealed 262.5      11/28/2017 Imaging    1. Interval decrease in right pleural effusion with resolution of right atelectasis seen previously. 2. New small left pleural effusion, symmetric to the right.  3. No intraperitoneal free fluid on the current study. 4. Continued further decrease in left omental disease, appearing less confluent today than on the prior study. 5. Left ovary remains normal in appearance today and the right adnexal cystic lesion is decreased in size compared to prior study. 6. 14 mm pancreatic cyst is unchanged. Continued attention on follow-up imaging recommended. 7. Aortic Atherosclerois (ICD10-170.0)      Reports very poor quality of life after chemotherapy is presently in rehab and feels much stronger than before.  Reports poor appetite and continued weight loss.  Social Hx:   Social History   Socioeconomic History  . Marital status: Divorced    Spouse name: Not on file  . Number of children: 1  . Years of education: 46  . Highest education level: Not on file  Social Needs  . Financial resource strain: Not on file  . Food insecurity - worry: Not on file  . Food insecurity - inability: Not on file   . Transportation needs - medical: Not on file  . Transportation needs - non-medical: Not on file  Occupational History  . Occupation: retired    Fish farm manager: RETIRED  Tobacco Use  . Smoking status: Former Smoker    Packs/day: 1.00    Years: 20.00    Pack years: 20.00    Types: Cigarettes    Last attempt to quit: 10/08/1980    Years since quitting: 37.1  . Smokeless tobacco: Never Used  Substance and Sexual Activity  . Alcohol use: Yes    Comment: rarely, 12-23-15 rarely  . Drug use: No  . Sexual activity: Not Currently  Other Topics Concern  . Not on file  Social History Narrative   Patient lives at home by herself.    Patient is retired.    Patient has 12 th grade education.           Past Surgical Hx:  Past Surgical History:  Procedure Laterality Date  . CHOLECYSTECTOMY  Dec 04, 2010  . COLONOSCOPY    . FEMUR IM NAIL Left 12/11/2013   Procedure: INTRAMEDULLARY (IM) NAIL FEMORAL;  Surgeon: [name], MD;  Location: [facility];  Service: Orthopedics;  Laterality: Left;  . FRACTURE SURGERY Left 12/11/2013   hip  . IR FLUORO GUIDE PORT INSERTION RIGHT  07/22/2017  . IR PARACENTESIS  07/12/2017  . IR US GUIDE VASC ACCESS RIGHT  07/22/2017  . KNEE SURGERY Left    x 2  . TOTAL KNEE REVISION  08/06/2012   Procedure: TOTAL KNEE REVISION;  Surgeon: [name], MD;  Location: [facility];  Service: Orthopedics;  Laterality: Left;  Left Total Knee Arthroplasty Revision  . Uterine polypectomy      Past Medical Hx:  Past Medical History:  Diagnosis Date  . COPD (chronic obstructive pulmonary disease) ([place])   . Depression   . Diverticulosis   . Family hx of colon cancer   . Fibromyalgia   . GERD (gastroesophageal reflux disease)   . Hemorrhoids   . Hiatal hernia   . History of shingles   . Hypothyroidism   . IBS (irritable bowel syndrome)   . Lymphocytic colitis   . Osteoporosis   . Ovarian cancer ([place])   . Paroxysmal atrial fibrillation ([facility])   . Psoriatic arthritis  ([place])   . Rectal polyp   . Schatzki's ring   . Uterine polyp     Past Gynecological History:   No LMP recorded. Patient is postmenopausal.  Family Hx:  Family History  Problem Relation Age of Onset  . Heart disease Father        pacemaker  . Lung cancer Father        hx of smoker  . Colon cancer Mother        dx in her mid 42's  . Colon cancer Maternal Grandfather   . Depression Sister     Review of Systems:  Constitutional  Feels well,   really Skin/Breast  No rash, sores, jaundice, itching, dryness Cardiovascular  +shortness of breath Pulmonary  Needs O2 Gastro Intestinal  No nausea, vomitting, or diarrhoea. No bright red blood per rectum, no change in bowel movement, or constipation. + distension of abdomen. Occasional pains. Genito Urinary  No frequency, urgency, dysuria, no bleeding Musculo Skeletal  + back pain Neurologic  No weakness, numbness, change in gait,  Psychology  No depression, anxiety, insomnia.   Vitals:  Blood pressure 127/63, pulse 91, temperature 98.4 F (36.9 C), temperature source Oral, resp. rate 20, weight 158 lb (71.7 kg), SpO2 97 %.  Physical Exam: WD in NAD, on O2, seated in a wheelchair Neck  Supple NROM, without any enlargements.  Sclera nonicteric Lymph Node Survey No cervical supraclavicular  adenopathy

## 2017-12-05 NOTE — Telephone Encounter (Signed)
Below message given to Dr. Alvy Bimler. Called daughter back per Dr. Alvy Bimler. Told that those Rx's are not needed and she does not need to pick them up at the pharmacy. Verbalized understanding.

## 2017-12-06 ENCOUNTER — Encounter (HOSPITAL_COMMUNITY): Payer: Self-pay | Admitting: Occupational Therapy

## 2017-12-06 ENCOUNTER — Telehealth: Payer: Self-pay | Admitting: Hematology and Oncology

## 2017-12-06 ENCOUNTER — Encounter (HOSPITAL_COMMUNITY): Payer: Self-pay | Admitting: Physical Therapy

## 2017-12-06 NOTE — Telephone Encounter (Signed)
Spoke to patients regarding upcoming march appointments per 2/28 sch message.

## 2017-12-11 ENCOUNTER — Encounter: Payer: Self-pay | Admitting: Pharmacist

## 2017-12-13 ENCOUNTER — Inpatient Hospital Stay: Payer: Medicare Other | Attending: Hematology and Oncology

## 2017-12-13 ENCOUNTER — Inpatient Hospital Stay: Payer: Medicare Other

## 2017-12-13 ENCOUNTER — Encounter: Payer: Self-pay | Admitting: Hematology and Oncology

## 2017-12-13 ENCOUNTER — Inpatient Hospital Stay (HOSPITAL_BASED_OUTPATIENT_CLINIC_OR_DEPARTMENT_OTHER): Payer: Medicare Other | Admitting: Hematology and Oncology

## 2017-12-13 ENCOUNTER — Ambulatory Visit: Payer: Self-pay | Admitting: Hematology and Oncology

## 2017-12-13 ENCOUNTER — Other Ambulatory Visit: Payer: Self-pay | Admitting: Hematology and Oncology

## 2017-12-13 ENCOUNTER — Other Ambulatory Visit: Payer: Self-pay

## 2017-12-13 ENCOUNTER — Ambulatory Visit: Payer: Self-pay

## 2017-12-13 DIAGNOSIS — Z7901 Long term (current) use of anticoagulants: Secondary | ICD-10-CM

## 2017-12-13 DIAGNOSIS — K862 Cyst of pancreas: Secondary | ICD-10-CM

## 2017-12-13 DIAGNOSIS — Z5111 Encounter for antineoplastic chemotherapy: Secondary | ICD-10-CM | POA: Diagnosis not present

## 2017-12-13 DIAGNOSIS — G620 Drug-induced polyneuropathy: Secondary | ICD-10-CM | POA: Diagnosis not present

## 2017-12-13 DIAGNOSIS — Z79899 Other long term (current) drug therapy: Secondary | ICD-10-CM | POA: Diagnosis not present

## 2017-12-13 DIAGNOSIS — T451X5S Adverse effect of antineoplastic and immunosuppressive drugs, sequela: Secondary | ICD-10-CM | POA: Insufficient documentation

## 2017-12-13 DIAGNOSIS — D696 Thrombocytopenia, unspecified: Secondary | ICD-10-CM | POA: Diagnosis not present

## 2017-12-13 DIAGNOSIS — C561 Malignant neoplasm of right ovary: Secondary | ICD-10-CM

## 2017-12-13 DIAGNOSIS — E441 Mild protein-calorie malnutrition: Secondary | ICD-10-CM | POA: Insufficient documentation

## 2017-12-13 DIAGNOSIS — J431 Panlobular emphysema: Secondary | ICD-10-CM

## 2017-12-13 DIAGNOSIS — D649 Anemia, unspecified: Secondary | ICD-10-CM | POA: Insufficient documentation

## 2017-12-13 DIAGNOSIS — I482 Chronic atrial fibrillation: Secondary | ICD-10-CM | POA: Diagnosis not present

## 2017-12-13 DIAGNOSIS — J449 Chronic obstructive pulmonary disease, unspecified: Secondary | ICD-10-CM | POA: Insufficient documentation

## 2017-12-13 DIAGNOSIS — T451X5A Adverse effect of antineoplastic and immunosuppressive drugs, initial encounter: Secondary | ICD-10-CM

## 2017-12-13 DIAGNOSIS — R531 Weakness: Secondary | ICD-10-CM

## 2017-12-13 DIAGNOSIS — D638 Anemia in other chronic diseases classified elsewhere: Secondary | ICD-10-CM | POA: Diagnosis not present

## 2017-12-13 DIAGNOSIS — J90 Pleural effusion, not elsewhere classified: Secondary | ICD-10-CM | POA: Diagnosis not present

## 2017-12-13 LAB — CMP (CANCER CENTER ONLY)
ALBUMIN: 3.1 g/dL — AB (ref 3.5–5.0)
ALK PHOS: 63 U/L (ref 40–150)
ALT: 9 U/L (ref 0–55)
AST: 13 U/L (ref 5–34)
Anion gap: 9 (ref 3–11)
BUN: 8 mg/dL (ref 7–26)
CALCIUM: 8.5 mg/dL (ref 8.4–10.4)
CO2: 25 mmol/L (ref 22–29)
CREATININE: 0.81 mg/dL (ref 0.60–1.10)
Chloride: 103 mmol/L (ref 98–109)
GFR, Est AFR Am: >60 mL/min (ref >60)
GFR, Estimated: >60 mL/min (ref >60)
GLUCOSE: 135 mg/dL (ref 70–140)
Potassium: 4 mmol/L (ref 3.5–5.1)
SODIUM: 137 mmol/L (ref 136–145)
Total Bilirubin: 0.3 mg/dL (ref 0.2–1.2)
Total Protein: 6.6 g/dL (ref 6.4–8.3)

## 2017-12-13 LAB — CBC WITH DIFFERENTIAL (CANCER CENTER ONLY)
BASOS ABS: 0 10*3/uL (ref 0.0–0.1)
BASOS PCT: 1 %
EOS ABS: 0.1 10*3/uL (ref 0.0–0.5)
Eosinophils Relative: 2 %
HCT: 32.8 % — ABNORMAL LOW (ref 34.8–46.6)
Hemoglobin: 10.9 g/dL — ABNORMAL LOW (ref 11.6–15.9)
LYMPHS ABS: 1.3 10*3/uL (ref 0.9–3.3)
Lymphocytes Relative: 21 %
MCH: 33 pg (ref 25.1–34.0)
MCHC: 33.2 g/dL (ref 31.5–36.0)
MCV: 99.3 fL (ref 79.5–101.0)
Monocytes Absolute: 0.6 10*3/uL (ref 0.1–0.9)
Monocytes Relative: 10 %
NEUTROS PCT: 66 %
Neutro Abs: 4.3 10*3/uL (ref 1.5–6.5)
Platelet Count: 164 10*3/uL (ref 145–400)
RBC: 3.31 MIL/uL — AB (ref 3.70–5.45)
RDW: 14.9 % — ABNORMAL HIGH (ref 11.2–14.5)
WBC: 6.3 10*3/uL (ref 3.9–10.3)

## 2017-12-13 MED ORDER — SODIUM CHLORIDE 0.9 % IV SOLN
Freq: Once | INTRAVENOUS | Status: AC
  Administered 2017-12-13: 15:00:00 via INTRAVENOUS

## 2017-12-13 MED ORDER — FAMOTIDINE 20 MG PO TABS
20.0000 mg | ORAL_TABLET | Freq: Once | ORAL | Status: AC
  Administered 2017-12-13: 20 mg via ORAL

## 2017-12-13 MED ORDER — PALONOSETRON HCL INJECTION 0.25 MG/5ML
0.2500 mg | Freq: Once | INTRAVENOUS | Status: AC
  Administered 2017-12-13: 0.25 mg via INTRAVENOUS

## 2017-12-13 MED ORDER — DIPHENHYDRAMINE HCL 25 MG PO TABS
25.0000 mg | ORAL_TABLET | Freq: Once | ORAL | Status: AC
  Administered 2017-12-13: 25 mg via ORAL
  Filled 2017-12-13: qty 1

## 2017-12-13 MED ORDER — DIPHENHYDRAMINE HCL 25 MG PO CAPS
ORAL_CAPSULE | ORAL | Status: AC
  Filled 2017-12-13: qty 1

## 2017-12-13 MED ORDER — SODIUM CHLORIDE 0.9% FLUSH
10.0000 mL | INTRAVENOUS | Status: DC | PRN
  Administered 2017-12-13: 10 mL
  Filled 2017-12-13: qty 10

## 2017-12-13 MED ORDER — SODIUM CHLORIDE 0.9 % IV SOLN
Freq: Once | INTRAVENOUS | Status: AC
  Administered 2017-12-13: 16:00:00 via INTRAVENOUS
  Filled 2017-12-13: qty 5

## 2017-12-13 MED ORDER — SODIUM CHLORIDE 0.9 % IV SOLN
444.6000 mg | Freq: Once | INTRAVENOUS | Status: AC
  Administered 2017-12-13: 440 mg via INTRAVENOUS
  Filled 2017-12-13: qty 44

## 2017-12-13 MED ORDER — PALONOSETRON HCL INJECTION 0.25 MG/5ML
INTRAVENOUS | Status: AC
  Filled 2017-12-13: qty 5

## 2017-12-13 MED ORDER — HEPARIN SOD (PORK) LOCK FLUSH 100 UNIT/ML IV SOLN
500.0000 [IU] | Freq: Once | INTRAVENOUS | Status: AC | PRN
  Administered 2017-12-13: 500 [IU]
  Filled 2017-12-13: qty 5

## 2017-12-13 MED ORDER — FAMOTIDINE 20 MG PO TABS
ORAL_TABLET | ORAL | Status: AC
  Filled 2017-12-13: qty 1

## 2017-12-13 NOTE — Assessment & Plan Note (Signed)
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

## 2017-12-13 NOTE — Assessment & Plan Note (Signed)
This is likely anemia of chronic disease. The patient denies recent history of bleeding such as epistaxis, hematuria or hematochezia. She is asymptomatic from the anemia. We will observe for now.  She does not require transfusion now.

## 2017-12-13 NOTE — Assessment & Plan Note (Signed)
Overall, she is doing better and is gaining weight She will continue nutritional supplement as needed

## 2017-12-13 NOTE — Patient Instructions (Signed)
[facility] Discharge Instructions for Patients Receiving Chemotherapy  Today you received the following chemotherapy agents Carboplatin  To help prevent nausea and vomiting after your treatment, we encourage you to take your nausea medication as directed   If you develop nausea and vomiting that is not controlled by your nausea medication, call the clinic.   BELOW ARE SYMPTOMS THAT SHOULD BE REPORTED IMMEDIATELY:  *FEVER GREATER THAN 100.5 F  *CHILLS WITH OR WITHOUT FEVER  NAUSEA AND VOMITING THAT IS NOT CONTROLLED WITH YOUR NAUSEA MEDICATION  *UNUSUAL SHORTNESS OF BREATH  *UNUSUAL BRUISING OR BLEEDING  TENDERNESS IN MOUTH AND THROAT WITH OR WITHOUT PRESENCE OF ULCERS  *URINARY PROBLEMS  *BOWEL PROBLEMS  UNUSUAL RASH Items with * indicate a potential emergency and should be followed up as soon as possible.  Feel free to call the clinic should you have any questions or concerns. The clinic phone number is [phone].  Please show the CHEMO ALERT CARD at check-in to the Emergency Department and triage nurse.   

## 2017-12-13 NOTE — Progress Notes (Signed)
[place] OFFICE PROGRESS NOTE  Patient Care Team: Asencion Noble, MD as PCP - General (Internal Medicine) Standley Brooking, LCSW as Social Worker  ASSESSMENT & PLAN:  Ovarian CA, right [facility]) I have reviewed multiple imaging studies with the patient and her daughter I have reviewed recent GYN oncologist recommendation with them Currently, she is recovering from recent hospitalization I reminded her that her cardiovascular risks for surgery remain high given recurrent hospitalization due to her multiple comorbidities The patient still have platinum sensitive disease We discussed the risk, benefits, side effects of continuing chemotherapy with single agent carboplatin only and she agreed to proceed She does not need G-CSF support I will see her back in 3 weeks for further follow-up I plan to repeat imaging study in 3 months for further assessment of response to treatment and will reconsider surgery for debulking surgery if her performance status improves  We discussed the role of chemotherapy. The intent is of curative intent.  We discussed some of the risks, benefits, side-effects of carboplatin  Some of the short term side-effects included, though not limited to, including weight loss, life threatening infections, risk of allergic reactions, need for transfusions of blood products, nausea, vomiting, change in bowel habits, loss of hair, admission to hospital for various reasons, and risks of death.   Long term side-effects are also discussed including risks of infertility, permanent damage to nerve function, hearing loss, chronic fatigue, kidney damage with possibility needing hemodialysis, and rare secondary malignancy including bone marrow disorders.  The patient is aware that the response rates discussed earlier is not guaranteed.  After a long discussion, patient made an informed decision to proceed with the prescribed plan of care.   Patient education material was  dispensed.    Anemia, chronic disease This is likely anemia of chronic disease. The patient denies recent history of bleeding such as epistaxis, hematuria or hematochezia. She is asymptomatic from the anemia. We will observe for now.  She does not require transfusion now.    Mild protein-calorie malnutrition ([place]) Overall, she is doing better and is gaining weight She will continue nutritional supplement as needed  COPD (chronic obstructive pulmonary disease) (HCC) Her shortness of breath is stable. She will continue inhalers as directed and oxygen as needed Recent CT scan showed minimum pleural effusion.  Generalized weakness She will continue physical therapy and occupational therapy  Peripheral neuropathy due to chemotherapy [facility]) She has residual grade 2 neuropathy from prior chemotherapy For this reason, I will not be recommending addition of Taxol   No orders of the defined types were placed in this encounter.   INTERVAL HISTORY: Please see below for problem oriented charting. She returns with her daughter for further follow-up She will be discharged from skilled nursing facility next week Her shortness of breath is much improved She denies recent cough Her appetite is stable and she is gaining weight She denies recent chest pain, shortness of breath or dizziness Denies abdominal swelling or leg swelling She complained of persistent peripheral neuropathy affecting her fingers and toes and she has occasionally dropped objects because of this She denies recent falls No recent infection or COPD exacerbation  SUMMARY OF ONCOLOGIC HISTORY:   Ovarian CA, right ([place])   02/18/2016 Tumor Marker    Patient's tumor was tested for the following markers: CA125 Results of the tumor marker test revealed 45      05/22/2016 Tumor Marker    Patient's tumor was tested for the following markers: CA125  Results of the tumor marker test revealed 53      05/22/2016 Imaging     Outside pelvic US showed 4.1 cm adnexa mass      06/24/2017 Imaging    Ct abdomen and pelvis:  1. Interim finding of moderate ascites within the abdomen and pelvis with additional finding of diffuse nodular infiltration of the omentum and anterior mesenteric fat, the appearance would be consistent with peritoneal carcinomatosis/metastatic disease. Increasing retroperitoneal and upper abdominal adenopathy. 2. Re- demonstrated 3.8 cm cyst in the right adnexa. Enlarging soft tissue density in the left adnexa now with possible cystic component posteriorly. In light of the above findings, concern is for ovarian neoplasm. Correlation with pelvic ultrasound recommended. 3. Small right-sided pleural effusion, new since prior study 4. Stable hypodense splenic lesions since 2017.       06/25/2017 Imaging    US pelvis: 2.9 cm simple appearing cyst in the right ovary. Left ovary grossly unremarkable. Large volume ascites in the pelvis      06/30/2017 - 07/01/2017 Hospital Admission    She was admitted for evaluation of abdominal pain and ascites      07/01/2017 Pathology Results    PERITONEAL/ASCITIC FLUID(SPECIMEN 1 OF 1 COLLECTED 07/01/17): - POORLY DIFFERENTIATED CARCINOMA; SEE COMMENT Source Peritoneal/Ascitic Fluid, (specimen 1 of 1 collected 07/01/17) Gross Specimen: Received is/are 1000 cc's of brownish fluid. (BS:bs) Prepared: # Smears: 0 # Concentration Technique Slides (i.e. ThinPrep): 1 # Cell Block: 1 Additional Studies: Also received Hematology slide - [id number]. Comment The tumor cells are positive for cytokeratin 7 and Pax-8 but negative for cytokeratin 20, CDX-2, GATA-3, Napsin-A and TTF-1. Based on the immunoprofile a gynecology primary is favored      07/01/2017 Procedure    Successful ultrasound-guided diagnostic and therapeutic paracentesis yielding 2.5 liters of peritoneal fluid      07/07/2017 - 07/09/2017 Hospital Admission    She was admitted for management of malignant  ascites      07/08/2017 Procedure    Successful ultrasound-guided therapeutic paracentesis yielding 2.7 liters liters of peritoneal fluid      07/12/2017 Procedure    Successful ultrasound-guided paracentesis yielding 1450 mL of peritoneal fluid      07/18/2017 - 07/24/2017 Hospital Admission    She was admitted for expedited treatment      07/18/2017 Tumor Marker    Patient's tumor was tested for the following markers: CA125 Results of the tumor marker test revealed 1941      07/19/2017 - 11/04/2017 Chemotherapy    The patient had 6 cycles of carboplatin & Taxol for chemotherapy treatment.        08/06/2017 Procedure    Successful ultrasound-guided therapeutic paracentesis yielding 2.6 liters of peritoneal fluid.      08/09/2017 Tumor Marker    Patient's tumor was tested for the following markers: CA125 Results of the tumor marker test revealed 1665      08/15/2017 Tumor Marker    Patient's tumor was tested for the following markers: CA125 Results of the tumor marker test revealed 937.9      08/20/2017 Imaging    ECHO: Normal LV size with EF 60-65%. Normal RV size and systolic function. No significant valvular abnormalities.      09/18/2017 Imaging    Chest Impression:  1. No evidence thoracic metastasis. 2. Interval increase and RIGHT pleural effusion.  Abdomen / Pelvis Impression:  1. Interval decrease in intraperitoneal free fluid. 2. Interval decrease in omental nodularity in the LEFT ventral peritoneal space. 3.  Interval decrease in nodularity associated with the LEFT ovary. 4. Cystic portion of the RIGHT ovary is increased mildly in size.      09/20/2017 Tumor Marker    Patient's tumor was tested for the following markers: CA125 Results of the tumor marker test revealed 347      10/14/2017 Tumor Marker    Patient's tumor was tested for the following markers: CA125 Results of the tumor marker test revealed 307.4      11/04/2017 Tumor Marker     Patient's tumor was tested for the following markers: CA125 Results of the tumor marker test revealed 262.5      11/28/2017 Imaging    1. Interval decrease in right pleural effusion with resolution of right atelectasis seen previously. 2. New small left pleural effusion, symmetric to the right. 3. No intraperitoneal free fluid on the current study. 4. Continued further decrease in left omental disease, appearing less confluent today than on the prior study. 5. Left ovary remains normal in appearance today and the right adnexal cystic lesion is decreased in size compared to prior study. 6. 14 mm pancreatic cyst is unchanged. Continued attention on follow-up imaging recommended. 7. Aortic Atherosclerois (ICD10-170.0)       REVIEW OF SYSTEMS:   Constitutional: Denies fevers, chills or abnormal weight loss Eyes: Denies blurriness of vision Ears, nose, mouth, throat, and face: Denies mucositis or sore throat Respiratory: Denies cough, dyspnea or wheezes Cardiovascular: Denies palpitation, chest discomfort or lower extremity swelling Gastrointestinal:  Denies nausea, heartburn or change in bowel habits Skin: Denies abnormal skin rashes Lymphatics: Denies new lymphadenopathy or easy bruising Behavioral/Psych: Mood is stable, no new changes  All other systems were reviewed with the patient and are negative.  I have reviewed the past medical history, past surgical history, social history and family history with the patient and they are unchanged from previous note.  ALLERGIES:  is allergic to codeine.  MEDICATIONS:  Current Outpatient Medications  Medication Sig Dispense Refill  . albuterol (PROAIR HFA) 108 (90 Base) MCG/ACT inhaler Inhale 2 puffs into the lungs every 6 (six) hours as needed for wheezing or shortness of breath. 1 Inhaler 3  . ALPRAZolam (XANAX) 0.25 MG tablet Take 1 tablet (0.25 mg total) by mouth at bedtime as needed for anxiety. 5 tablet 0  . azelastine (ASTELIN) 0.1 %  nasal spray Place 2 sprays into both nostrils 2 (two) times daily as needed for allergies.     . budesonide-formoterol (SYMBICORT) 160-4.5 MCG/ACT inhaler Inhale 2 puffs into the lungs 2 (two) times daily. 1 Inhaler 3  . Cholecalciferol (VITAMIN D) 2000 units CAPS Take 2,000 Units by mouth daily.    Marland Kitchen diltiazem (CARTIA XT) 120 MG 24 hr capsule TAKE 1 CAPSULE BY MOUTH EVERY DAY 90 capsule 3  . diphenoxylate-atropine (LOMOTIL) 2.5-0.025 MG tablet Take 1 tablet by mouth 4 (four) times daily. (Patient taking differently: Take 1 tablet by mouth 4 (four) times daily as needed for diarrhea or loose stools. ) 360 tablet 1  . fluticasone (FLONASE) 50 MCG/ACT nasal spray Place 2 sprays into both nostrils 2 (two) times daily. For nasal congestion    . furosemide (LASIX) 20 MG tablet Take 1 tablet (20 mg total) by mouth daily. 90 tablet 9  . ipratropium-albuterol (DUONEB) 0.5-2.5 (3) MG/3ML SOLN Take 3 mLs by nebulization 3 (three) times daily. (Patient taking differently: Take 3 mLs by nebulization 3 (three) times daily. As needed) 360 mL   . levothyroxine (SYNTHROID, LEVOTHROID) 137 MCG tablet  Take 137 mcg by mouth every morning. For thyroid therapy    . lidocaine-prilocaine (EMLA) cream Apply 1 application topically as needed. 30 g 6  . lidocaine-prilocaine (EMLA) cream Apply to affected area once 30 g 3  . loperamide (IMODIUM) 2 MG capsule Take 2 capsules (4 mg total) by mouth 3 (three) times daily. (Patient taking differently: Take 4 mg by mouth as needed for diarrhea or loose stools. ) 30 capsule 0  . magic mouthwash w/lidocaine SOLN Take 5 mLs by mouth 4 (four) times daily. 5 ml QID swish and spit (Patient taking differently: Take 5 mLs by mouth 4 (four) times daily as needed for mouth pain. 5 ml QID swish and spit) 240 mL 0  . methylcellulose (ARTIFICIAL TEARS) 1 % ophthalmic solution Place 1 drop into both eyes 2 (two) times daily as needed (for dry eyes).     . mirtazapine (REMERON) 15 MG tablet Take 1  tablet (15 mg total) by mouth at bedtime. 30 tablet 1  . ondansetron (ZOFRAN) 8 MG tablet Take 1 tablet (8 mg total) by mouth every 8 (eight) hours as needed for nausea or vomiting. 60 tablet 0  . ondansetron (ZOFRAN) 8 MG tablet Take 1 tablet (8 mg total) by mouth 2 (two) times daily as needed for refractory nausea / vomiting. Start on day 3 after chemo. 30 tablet 1  . Oxycodone HCl 10 MG TABS Take 1 tablet (10 mg total) by mouth every 4 (four) hours as needed. 20 tablet 0  . polyethylene glycol (MIRALAX / GLYCOLAX) packet Take 17 g by mouth daily. 14 each 0  . prochlorperazine (COMPAZINE) 10 MG tablet Take 10 mg by mouth every 6 (six) hours as needed for nausea.  1  . prochlorperazine (COMPAZINE) 10 MG tablet Take 1 tablet (10 mg total) by mouth every 6 (six) hours as needed (Nausea or vomiting). 30 tablet 1  . senna-docusate (SENOKOT-S) 8.6-50 MG tablet Take 1 tablet by mouth at bedtime. 30 tablet 0  . XARELTO 20 MG TABS tablet TAKE 1 TABLET BY MOUTH EVERY DAY WITH SUPPER 90 tablet 0   No current facility-administered medications for this visit.    Facility-Administered Medications Ordered in Other Visits  Medication Dose Route Frequency Provider Last Rate Last Dose  . CARBOplatin (PARAPLATIN) 440 mg in sodium chloride 0.9 % 250 mL chemo infusion  440 mg Intravenous Once Alvy Bimler, Saniyya Gau, MD      . famotidine (PEPCID) tablet 20 mg  20 mg Oral Once Alvy Bimler, Ertha Nabor, MD      . fosaprepitant (EMEND) 150 mg, dexamethasone (DECADRON) 12 mg in sodium chloride 0.9 % 145 mL IVPB   Intravenous Once Heath Lark, MD 454 mL/hr at 12/13/17 1541    . heparin lock flush 100 unit/mL  500 Units Intracatheter Once PRN Alvy Bimler, Crystallynn Noorani, MD      . sodium chloride flush (NS) 0.9 % injection 10 mL  10 mL Intracatheter PRN Alvy Bimler, Kayron Hicklin, MD        PHYSICAL EXAMINATION: ECOG PERFORMANCE STATUS: 2 - Symptomatic, <50% confined to bed  Vitals:   12/13/17 1414  BP: (!) 142/63  Pulse: 95  Resp: 18  Temp: 98.2 F (36.8 C)  SpO2:  99%   Filed Weights   12/13/17 1414  Weight: 162 lb 3.2 oz (73.6 kg)    GENERAL:alert, no distress and comfortable.  She has oxygen delivered via nasal cannula, sitting on the wheelchair SKIN: skin color, texture, turgor are normal, no rashes or significant lesions EYES:  normal, Conjunctiva are pink and non-injected, sclera clear OROPHARYNX:no exudate, no erythema and lips, buccal mucosa, and tongue normal  NECK: supple, thyroid normal size, non-tender, without nodularity LYMPH:  no palpable lymphadenopathy in the cervical, axillary or inguinal LUNGS: clear to auscultation and percussion with normal breathing effort HEART: regular rate & rhythm and no murmurs and no lower extremity edema ABDOMEN:abdomen soft, non-tender and normal bowel sounds Musculoskeletal:no cyanosis of digits and no clubbing  NEURO: alert & oriented x 3 with fluent speech, no focal motor/sensory deficits  LABORATORY DATA:  I have reviewed the data as listed    Component Value Date/Time   NA 137 12/13/2017 1232   NA 135 (L) 09/24/2017 0945   K 4.0 12/13/2017 1232   K 3.7 09/24/2017 0945   CL 103 12/13/2017 1232   CO2 25 12/13/2017 1232   CO2 30 (H) 09/24/2017 0945   GLUCOSE 135 12/13/2017 1232   GLUCOSE 101 09/24/2017 0945   BUN 8 12/13/2017 1232   BUN 15.3 09/24/2017 0945   CREATININE 0.81 12/13/2017 1232   CREATININE 0.8 09/24/2017 0945   CALCIUM 8.5 12/13/2017 1232   CALCIUM 9.1 09/24/2017 0945   PROT 6.6 12/13/2017 1232   PROT 6.6 09/24/2017 0945   ALBUMIN 3.1 (L) 12/13/2017 1232   ALBUMIN 3.3 (L) 09/24/2017 0945   AST 13 12/13/2017 1232   AST 16 09/24/2017 0945   ALT 9 12/13/2017 1232   ALT 12 09/24/2017 0945   ALKPHOS 63 12/13/2017 1232   ALKPHOS 52 09/24/2017 0945   BILITOT 0.3 12/13/2017 1232   BILITOT 1.13 09/24/2017 0945   GFRNONAA >60 12/13/2017 1232   GFRAA >60 12/13/2017 1232    No results found for: SPEP, UPEP  Lab Results  Component Value Date   WBC 6.3 12/13/2017    NEUTROABS 4.3 12/13/2017   HGB 9.6 (L) 11/15/2017   HCT 32.8 (L) 12/13/2017   MCV 99.3 12/13/2017   PLT 164 12/13/2017      Chemistry      Component Value Date/Time   NA 137 12/13/2017 1232   NA 135 (L) 09/24/2017 0945   K 4.0 12/13/2017 1232   K 3.7 09/24/2017 0945   CL 103 12/13/2017 1232   CO2 25 12/13/2017 1232   CO2 30 (H) 09/24/2017 0945   BUN 8 12/13/2017 1232   BUN 15.3 09/24/2017 0945   CREATININE 0.81 12/13/2017 1232   CREATININE 0.8 09/24/2017 0945      Component Value Date/Time   CALCIUM 8.5 12/13/2017 1232   CALCIUM 9.1 09/24/2017 0945   ALKPHOS 63 12/13/2017 1232   ALKPHOS 52 09/24/2017 0945   AST 13 12/13/2017 1232   AST 16 09/24/2017 0945   ALT 9 12/13/2017 1232   ALT 12 09/24/2017 0945   BILITOT 0.3 12/13/2017 1232   BILITOT 1.13 09/24/2017 0945       RADIOGRAPHIC STUDIES: I have personally reviewed the radiological images as listed and agreed with the findings in the report. Ct Chest W Contrast  Result Date: 11/28/2017 CLINICAL DATA:  Ovarian cancer. EXAM: CT CHEST, ABDOMEN, AND PELVIS WITH CONTRAST TECHNIQUE: Multidetector CT imaging of the chest, abdomen and pelvis was performed following the standard protocol during bolus administration of intravenous contrast. CONTRAST:  100 cc Isovue-300 COMPARISON:  Chest CT 11/08/2017. Chest abdomen and pelvis CT 09/18/2017. FINDINGS: CT CHEST FINDINGS Cardiovascular: The heart size is normal. No pericardial effusion. Coronary artery calcification is evident. Atherosclerotic calcification is noted in the wall of the thoracic aorta. Mediastinum/Nodes: No mediastinal  lymphadenopathy. There is no hilar lymphadenopathy. There is no axillary lymphadenopathy. Lungs/Pleura: Moderate right pleural effusion seen previously has decreased and is now small. Symmetric small left pleural effusion new in the interval. Scattered tiny peripheral pulmonary nodules measuring 4 mm or less in size are noted bilaterally, stable since  09/07/2017. Right lung atelectasis seen 11/08/2017 has largely resolved in the interval. Stable chronic atelectasis or scarring in the lingula with some new mild subsegmental atelectasis noted left lower lobe. No new or enlarging pulmonary nodule or mass identified. No focal airspace consolidation. Musculoskeletal: Bone windows reveal no worrisome lytic or sclerotic osseous lesions. CT ABDOMEN PELVIS FINDINGS Hepatobiliary: No focal abnormality within the liver parenchyma. Gallbladder surgically absent. Stable appearance extrahepatic bile ducts. Pancreas: 14 mm cyst in the body of pancreas is stable. No dilatation of the main pancreatic duct. Spleen: Scattered tiny hypoattenuating lesions in the spleen are stable. Adrenals/Urinary Tract: No adrenal nodule or mass. Right kidney unremarkable. 7 mm hypoattenuating lesion interpolar left kidney is new since 09/07/2017. No evidence for hydroureter. The urinary bladder appears normal for the degree of distention. Stomach/Bowel: Stomach is nondistended. No gastric wall thickening. No evidence of outlet obstruction. Duodenum is normally positioned as is the ligament of Treitz. Duodenal diverticulum evident. No small bowel wall thickening. No small bowel dilatation. The terminal ileum is normal. The appendix is not visualized, but there is no edema or inflammation in the region of the cecum. Diverticular changes are noted in the left colon without evidence of diverticulitis. Vascular/Lymphatic: There is abdominal aortic atherosclerosis without aneurysm. There is no gastrohepatic or hepatoduodenal ligament lymphadenopathy. No intraperitoneal or retroperitoneal lymphadenopathy. No pelvic sidewall lymphadenopathy. Reproductive: Right adnexal cystic lesion measures 3.9 cm maximum diameter today compared to 4.3 cm previously. No left adnexal mass. Other: No intraperitoneal free fluid. Previously described left omental disease has decreased in the interval, appearing less  confluent on today's exam. Musculoskeletal: Surgical hardware noted left femoral neck. Bone windows reveal no worrisome lytic or sclerotic osseous lesions. IMPRESSION: 1. Interval decrease in right pleural effusion with resolution of right atelectasis seen previously. 2. New small left pleural effusion, symmetric to the right. 3. No intraperitoneal free fluid on the current study. 4. Continued further decrease in left omental disease, appearing less confluent today than on the prior study. 5. Left ovary remains normal in appearance today and the right adnexal cystic lesion is decreased in size compared to prior study. 6. 14 mm pancreatic cyst is unchanged. Continued attention on follow-up imaging recommended. 7.  Aortic Atherosclerois (ICD10-170.0) Electronically Signed   By: Misty Stanley M.D.   On: 11/28/2017 13:16   Ct Abdomen Pelvis W Contrast  Result Date: 11/28/2017 CLINICAL DATA:  Ovarian cancer. EXAM: CT CHEST, ABDOMEN, AND PELVIS WITH CONTRAST TECHNIQUE: Multidetector CT imaging of the chest, abdomen and pelvis was performed following the standard protocol during bolus administration of intravenous contrast. CONTRAST:  100 cc Isovue-300 COMPARISON:  Chest CT 11/08/2017. Chest abdomen and pelvis CT 09/18/2017. FINDINGS: CT CHEST FINDINGS Cardiovascular: The heart size is normal. No pericardial effusion. Coronary artery calcification is evident. Atherosclerotic calcification is noted in the wall of the thoracic aorta. Mediastinum/Nodes: No mediastinal lymphadenopathy. There is no hilar lymphadenopathy. There is no axillary lymphadenopathy. Lungs/Pleura: Moderate right pleural effusion seen previously has decreased and is now small. Symmetric small left pleural effusion new in the interval. Scattered tiny peripheral pulmonary nodules measuring 4 mm or less in size are noted bilaterally, stable since 09/07/2017. Right lung atelectasis seen 11/08/2017 has largely resolved  in the interval. Stable chronic  atelectasis or scarring in the lingula with some new mild subsegmental atelectasis noted left lower lobe. No new or enlarging pulmonary nodule or mass identified. No focal airspace consolidation. Musculoskeletal: Bone windows reveal no worrisome lytic or sclerotic osseous lesions. CT ABDOMEN PELVIS FINDINGS Hepatobiliary: No focal abnormality within the liver parenchyma. Gallbladder surgically absent. Stable appearance extrahepatic bile ducts. Pancreas: 14 mm cyst in the body of pancreas is stable. No dilatation of the main pancreatic duct. Spleen: Scattered tiny hypoattenuating lesions in the spleen are stable. Adrenals/Urinary Tract: No adrenal nodule or mass. Right kidney unremarkable. 7 mm hypoattenuating lesion interpolar left kidney is new since 09/07/2017. No evidence for hydroureter. The urinary bladder appears normal for the degree of distention. Stomach/Bowel: Stomach is nondistended. No gastric wall thickening. No evidence of outlet obstruction. Duodenum is normally positioned as is the ligament of Treitz. Duodenal diverticulum evident. No small bowel wall thickening. No small bowel dilatation. The terminal ileum is normal. The appendix is not visualized, but there is no edema or inflammation in the region of the cecum. Diverticular changes are noted in the left colon without evidence of diverticulitis. Vascular/Lymphatic: There is abdominal aortic atherosclerosis without aneurysm. There is no gastrohepatic or hepatoduodenal ligament lymphadenopathy. No intraperitoneal or retroperitoneal lymphadenopathy. No pelvic sidewall lymphadenopathy. Reproductive: Right adnexal cystic lesion measures 3.9 cm maximum diameter today compared to 4.3 cm previously. No left adnexal mass. Other: No intraperitoneal free fluid. Previously described left omental disease has decreased in the interval, appearing less confluent on today's exam. Musculoskeletal: Surgical hardware noted left femoral neck. Bone windows reveal no  worrisome lytic or sclerotic osseous lesions. IMPRESSION: 1. Interval decrease in right pleural effusion with resolution of right atelectasis seen previously. 2. New small left pleural effusion, symmetric to the right. 3. No intraperitoneal free fluid on the current study. 4. Continued further decrease in left omental disease, appearing less confluent today than on the prior study. 5. Left ovary remains normal in appearance today and the right adnexal cystic lesion is decreased in size compared to prior study. 6. 14 mm pancreatic cyst is unchanged. Continued attention on follow-up imaging recommended. 7.  Aortic Atherosclerois (ICD10-170.0) Electronically Signed   By: Misty Stanley M.D.   On: 11/28/2017 13:16   Dg Chest Port 1 View  Result Date: 11/15/2017 CLINICAL DATA:  Dyspnea EXAM: PORTABLE CHEST 1 VIEW COMPARISON:  11/14/2017 FINDINGS: Cardiac shadow is within normal limits. Aortic calcifications are again seen. Right chest wall port is again seen and stable. The lungs are well aerated bilaterally. No sizable effusion is seen. Minimal left basilar atelectasis is noted. IMPRESSION: Minimal left basilar atelectasis. Electronically Signed   By: Inez Catalina M.D.   On: 11/15/2017 07:47   Dg Chest Port 1 View  Result Date: 11/14/2017 CLINICAL DATA:  Status post thoracentesis on the right EXAM: PORTABLE CHEST 1 VIEW COMPARISON:  11/12/2017 FINDINGS: Cardiac shadow is within normal limits. Improved aeration in the right lung base is noted. No pneumothorax is seen. The remainder the exam is stable from the prior study. IMPRESSION: No pneumothorax following right thoracentesis. Electronically Signed   By: Inez Catalina M.D.   On: 11/14/2017 15:00    All questions were answered. The patient knows to call the clinic with any problems, questions or concerns. No barriers to learning was detected.  I spent 25 minutes counseling the patient face to face. The total time spent in the appointment was 40 minutes and  more than 50% was on  counseling and review of test results  Heath Lark, MD 12/13/2017 3:49 PM

## 2017-12-13 NOTE — Assessment & Plan Note (Signed)
She will continue physical therapy and occupational therapy

## 2017-12-13 NOTE — Patient Instructions (Signed)

## 2017-12-13 NOTE — Assessment & Plan Note (Signed)
Her shortness of breath is stable. She will continue inhalers as directed and oxygen as needed Recent CT scan showed minimum pleural effusion.

## 2017-12-13 NOTE — Assessment & Plan Note (Signed)
I have reviewed multiple imaging studies with the patient and her daughter I have reviewed recent GYN oncologist recommendation with them Currently, she is recovering from recent hospitalization I reminded her that her cardiovascular risks for surgery remain high given recurrent hospitalization due to her multiple comorbidities The patient still have platinum sensitive disease We discussed the risk, benefits, side effects of continuing chemotherapy with single agent carboplatin only and she agreed to proceed She does not need G-CSF support I will see her back in 3 weeks for further follow-up I plan to repeat imaging study in 3 months for further assessment of response to treatment and will reconsider surgery for debulking surgery if her performance status improves  We discussed the role of chemotherapy. The intent is of curative intent.  We discussed some of the risks, benefits, side-effects of carboplatin  Some of the short term side-effects included, though not limited to, including weight loss, life threatening infections, risk of allergic reactions, need for transfusions of blood products, nausea, vomiting, change in bowel habits, loss of hair, admission to hospital for various reasons, and risks of death.   Long term side-effects are also discussed including risks of infertility, permanent damage to nerve function, hearing loss, chronic fatigue, kidney damage with possibility needing hemodialysis, and rare secondary malignancy including bone marrow disorders.  The patient is aware that the response rates discussed earlier is not guaranteed.  After a long discussion, patient made an informed decision to proceed with the prescribed plan of care.   Patient education material was dispensed.

## 2017-12-14 LAB — CA 125: CANCER ANTIGEN (CA) 125: 197.7 U/mL — AB (ref 0.0–38.1)

## 2017-12-16 ENCOUNTER — Telehealth: Payer: Self-pay | Admitting: Cardiology

## 2017-12-16 ENCOUNTER — Telehealth: Payer: Self-pay | Admitting: *Deleted

## 2017-12-16 NOTE — Telephone Encounter (Signed)
Reschedule for 3 months.

## 2017-12-16 NOTE — Telephone Encounter (Signed)
3/26 apt with PA-C cancelled by daughter as pt's surgery cancelled.When does patient need to come back ?.I understand she was to have 1 month follow up, please clarify.

## 2017-12-16 NOTE — Telephone Encounter (Signed)
-----   Message from Heath Lark, MD sent at 12/16/2017  8:12 AM EDT ----- Regarding: labs Pls let her daughter know CA-125 is improving ----- Message ----- From: Interface, Lab In [place] Sent: 12/13/2017   1:08 PM To: Heath Lark, MD

## 2017-12-16 NOTE — Telephone Encounter (Signed)
Notified of CA 125.   Daughter states they forgot to show Dr Alvy Bimler, pt has developed brown spots under her fingernails in the last few weeks. Wanted to make Dr Alvy Bimler aware. Discussed taxol and its effects on nail beds.   Also thought appt on 3/26 with Dr Skeet Latch is to be cancelled.

## 2017-12-16 NOTE — Telephone Encounter (Signed)
I cancelled her appt with Dr. Skeet Latch The brown spots are from Taxol

## 2017-12-16 NOTE — Telephone Encounter (Signed)
Patient was scheduled for appt w/ [place] on 3/26 for surgical clearance. Patient's surgery has been cancelled. Daughter is questioning when patient will need to follow up again. / tg

## 2017-12-16 NOTE — Telephone Encounter (Signed)
Daughter notified of f/u apt 03/18/16 @ 1140 am with Dr Domenic Polite

## 2017-12-17 ENCOUNTER — Emergency Department (HOSPITAL_COMMUNITY): Payer: Medicare Other

## 2017-12-17 ENCOUNTER — Emergency Department (HOSPITAL_COMMUNITY)
Admission: EM | Admit: 2017-12-17 | Discharge: 2017-12-17 | Disposition: A | Discharge status: Skilled Nursing Facility | Payer: Medicare Other | Attending: Emergency Medicine | Admitting: Emergency Medicine

## 2017-12-17 ENCOUNTER — Other Ambulatory Visit: Payer: Self-pay

## 2017-12-17 DIAGNOSIS — Z87891 Personal history of nicotine dependence: Secondary | ICD-10-CM | POA: Insufficient documentation

## 2017-12-17 DIAGNOSIS — R000 Tachycardia, unspecified: Secondary | ICD-10-CM | POA: Diagnosis present

## 2017-12-17 DIAGNOSIS — J449 Chronic obstructive pulmonary disease, unspecified: Secondary | ICD-10-CM | POA: Insufficient documentation

## 2017-12-17 DIAGNOSIS — I4891 Unspecified atrial fibrillation: Secondary | ICD-10-CM

## 2017-12-17 DIAGNOSIS — Z79899 Other long term (current) drug therapy: Secondary | ICD-10-CM | POA: Insufficient documentation

## 2017-12-17 DIAGNOSIS — Z8543 Personal history of malignant neoplasm of ovary: Secondary | ICD-10-CM | POA: Diagnosis not present

## 2017-12-17 LAB — BASIC METABOLIC PANEL
Anion gap: 10 (ref 5–15)
BUN: 15 mg/dL (ref 6–20)
CHLORIDE: 99 mmol/L — AB (ref 101–111)
CO2: 26 mmol/L (ref 22–32)
Calcium: 8.3 mg/dL — ABNORMAL LOW (ref 8.9–10.3)
Creatinine, Ser: 0.83 mg/dL (ref 0.44–1.00)
GFR calc Af Amer: >60 mL/min (ref >60)
GFR calc non Af Amer: >60 mL/min (ref >60)
GLUCOSE: 102 mg/dL — AB (ref 65–99)
POTASSIUM: 3.8 mmol/L (ref 3.5–5.1)
Sodium: 135 mmol/L (ref 135–145)

## 2017-12-17 LAB — CBC
HEMATOCRIT: 36.4 % (ref 36.0–46.0)
Hemoglobin: 12.1 g/dL (ref 12.0–15.0)
MCH: 33.4 pg (ref 26.0–34.0)
MCHC: 33.2 g/dL (ref 30.0–36.0)
MCV: 100.6 fL — AB (ref 78.0–100.0)
PLATELETS: 209 10*3/uL (ref 150–400)
RBC: 3.62 MIL/uL — ABNORMAL LOW (ref 3.87–5.11)
RDW: 13.7 % (ref 11.5–15.5)
WBC: 9.2 10*3/uL (ref 4.0–10.5)

## 2017-12-17 LAB — I-STAT TROPONIN, ED: TROPONIN I, POC: 0 ng/mL (ref 0.00–0.08)

## 2017-12-17 MED ORDER — DILTIAZEM LOAD VIA INFUSION
20.0000 mg | Freq: Once | INTRAVENOUS | Status: AC
  Administered 2017-12-17: 20 mg via INTRAVENOUS
  Filled 2017-12-17: qty 20

## 2017-12-17 MED ORDER — SODIUM CHLORIDE 0.9 % IV SOLN
INTRAVENOUS | Status: DC
  Administered 2017-12-17: 19:00:00 via INTRAVENOUS

## 2017-12-17 MED ORDER — DILTIAZEM HCL-DEXTROSE 100-5 MG/100ML-% IV SOLN (PREMIX)
5.0000 mg/h | INTRAVENOUS | Status: DC
  Administered 2017-12-17: 5 mg/h via INTRAVENOUS
  Filled 2017-12-17: qty 100

## 2017-12-17 NOTE — ED Notes (Signed)
12 lead EKG repeated pt condition stable

## 2017-12-17 NOTE — Discharge Instructions (Addendum)
Continue your current medications, contact your cardiologist as we discussed

## 2017-12-17 NOTE — ED Triage Notes (Signed)
Per EMS, pt is coming from [facility] after stating "her heart was racing". This started at appox. 4:45 this evening. Pt has a hx of a fib, ovarian cancer, and COPD. Pt takes xarelto and lasix. Pt AO x4.

## 2017-12-17 NOTE — ED Notes (Signed)
Pt ambulated to BR with assist of 1 staff gait steady upon  return to room HR increased to 107 BPM once allowed to rest HR decreased to 78 BPM pt denies SOB or pain is alert x4 requesting discharge home.

## 2017-12-17 NOTE — ED Notes (Signed)
Bed: ZC58 Expected date:  Expected time:  Means of arrival:  Comments: EMS-AFIB

## 2017-12-17 NOTE — ED Provider Notes (Signed)
[facility] DEPT Provider Note   CSN: [id number] Arrival date & time: 12/17/17  1812     History   Chief Complaint Chief Complaint  Patient presents with  . Tachycardia    HPI [name] is a 84 y.o. female.  HPI Patient presents to the emergency room for evaluation of her heart racing.  Patient has a history of atrial fibrillation.  She noticed this evening that suddenly at about 4:45 her heart started racing.  She is not having trouble with chest pain or shortness of breath with this.  She is currently is at [facility].  Evaluating her for her complaints and noted that she was tachycardic.  EMS was called and she was sent to the emergency room.  Patient has had episodes of rapid atrial fibrillation in the past.  Previously she states it was difficult to control her tachycardia.  She has never had a cardioversion. Past Medical History:  Diagnosis Date  . COPD (chronic obstructive pulmonary disease) ([place])   . Depression   . Diverticulosis   . Family hx of colon cancer   . Fibromyalgia   . GERD (gastroesophageal reflux disease)   . Hemorrhoids   . Hiatal hernia   . History of shingles   . Hypothyroidism   . IBS (irritable bowel syndrome)   . Lymphocytic colitis   . Osteoporosis   . Ovarian cancer ([place])   . Paroxysmal atrial fibrillation ([facility])   . Psoriatic arthritis ([place])   . Rectal polyp   . Schatzki's ring   . Uterine polyp     Patient Active Problem List   Diagnosis Date Noted  . Anemia, chronic disease 12/13/2017  . Mild protein-calorie malnutrition ([place]) 12/13/2017  . 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 S/P thoracentesis   . Atrial fibrillation with RVR ([place]) 11/08/2017  . HCAP (healthcare-associated pneumonia) 11/08/2017  . Pleural effusion on right 09/20/2017  . Acute on chronic diastolic heart failure ([place]) 08/19/2017  . Pancytopenia, acquired ([place]) 08/15/2017  .  Protein-calorie malnutrition, moderate ([place]) 07/30/2017  . Generalized weakness 07/30/2017  . Antineoplastic chemotherapy induced pancytopenia ([place]) 07/20/2017  . Ovarian CA, right ([place]) 07/18/2017  . PNA (pneumonia) 07/07/2017  . Ascites 06/30/2017  . Ascites, malignant 06/30/2017  . Right ovarian cyst 01/18/2017  . Elevated CA-125 01/18/2017  . Depression 12/23/2015  . Peripheral edema 08/23/2015  . Fracture of hip, left, closed ([place]) 12/08/2013  . Hip fracture ([place]) 12/08/2013  . PAD (peripheral artery disease) ([place]) 11/25/2013  . Encounter for therapeutic drug monitoring 11/09/2013  . Atrial fibrillation ([place]) 05/07/2013  . Cough 03/20/2013  . Total knee replacement status 10/07/2012  . Knee pain 10/07/2012  . Knee stiffness 10/07/2012  . Tachycardia 09/17/2012  . Chest pain 09/17/2012  . Difficulty in walking(719.7) 09/16/2012  . Muscle weakness (generalized) 09/16/2012  . Postop Acute blood loss anemia 08/08/2012  . 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 Bloating 02/14/2012  . Upper abdominal pain 02/14/2012  . Allergic rhinitis, seasonal 02/15/2011  . COLITIS 02/20/2010  . Diarrhea 01/02/2010  . ABDOMINAL PAIN -GENERALIZED 01/02/2010  . PERSONAL HX COLONIC POLYPS 01/02/2010  . HYPOTHYROIDISM 12/28/2009  . COPD (chronic obstructive pulmonary disease) ([place]) 12/28/2009  . ARTHRITIS 12/28/2009    Past Surgical History:  Procedure Laterality Date  . CHOLECYSTECTOMY  Dec 04, 2010  . COLONOSCOPY    . FEMUR IM NAIL Left 12/11/2013   Procedure: INTRAMEDULLARY (IM) NAIL FEMORAL;  Surgeon: Dione Plover  Aluisio, MD;  Location: [facility] ORS;  Service: Orthopedics;  Laterality: Left;  . FRACTURE SURGERY Left 12/11/2013   hip  . IR FLUORO GUIDE PORT INSERTION RIGHT  07/22/2017  . IR PARACENTESIS  07/12/2017  . IR US GUIDE VASC ACCESS RIGHT  07/22/2017  . KNEE SURGERY Left    x 2  . TOTAL KNEE REVISION  08/06/2012   Procedure: TOTAL KNEE REVISION;  Surgeon: [name],  MD;  Location: [facility] ORS;  Service: Orthopedics;  Laterality: Left;  Left Total Knee Arthroplasty Revision  . Uterine polypectomy      OB History    No data available       Home Medications    Prior to Admission medications   Medication Sig Start Date End Date Taking? Authorizing Provider  albuterol (PROAIR HFA) 108 (90 Base) MCG/ACT inhaler Inhale 2 puffs into the lungs every 6 (six) hours as needed for wheezing or shortness of breath. 06/19/17  Yes Collene Gobble, MD  ALPRAZolam Duanne Moron) 0.25 MG tablet Take 1 tablet (0.25 mg total) by mouth at bedtime as needed for anxiety. 11/15/17  Yes Amin, Ankit Chirag, MD  azelastine (ASTELIN) 0.1 % nasal spray Place 2 sprays into both nostrils 2 (two) times daily as needed for allergies.    Yes [provider]  Cholecalciferol (VITAMIN D) 2000 units CAPS Take 2,000 Units by mouth daily.   Yes [provider]  diltiazem (DILACOR XR) 180 MG 24 hr capsule Take 180 mg by mouth daily.   Yes [provider]  diphenhydrAMINE (BENADRYL) 25 mg capsule Take 25 mg by mouth every 6 (six) hours as needed for itching.   Yes [provider]  diphenoxylate-atropine (LOMOTIL) 2.5-0.025 MG tablet Take 1 tablet by mouth 4 (four) times daily. Patient taking differently: Take 1 tablet by mouth 4 (four) times daily as needed for diarrhea or loose stools.  08/21/16  Yes [name], MD  fluticasone [facility]) 50 MCG/ACT nasal spray Place 2 sprays into both nostrils 2 (two) times daily. For nasal congestion 11/20/13  Yes [provider]  furosemide (LASIX) 20 MG tablet Take 1 tablet (20 mg total) by mouth daily. 11/04/17  Yes Heath Lark, MD  HYDROcodone-acetaminophen (NORCO/VICODIN) 5-325 MG tablet Take 2 tablets by mouth every 4 (four) hours as needed for moderate pain or severe pain.   Yes [provider]  ipratropium-albuterol (DUONEB) 0.5-2.5 (3) MG/3ML SOLN Take 3 mLs by nebulization 3 (three) times daily. Patient taking  differently: Take 3 mLs by nebulization every 6 (six) hours as needed (cough or SOB). As needed 11/15/17  Yes Amin, Ankit Chirag, MD  levothyroxine (SYNTHROID, LEVOTHROID) 137 MCG tablet Take 137 mcg by mouth every morning. For thyroid therapy   Yes [provider]  lidocaine-prilocaine (EMLA) cream Apply 1 application topically as needed. 07/30/17  Yes Gorsuch, Ni, MD  loperamide (IMODIUM) 2 MG capsule Take 2 capsules (4 mg total) by mouth 3 (three) times daily. 07/24/17  Yes Gorsuch, Ni, MD  mirtazapine (REMERON) 15 MG tablet Take 1 tablet (15 mg total) by mouth at bedtime. 11/04/17  Yes Gorsuch, Ni, MD  nystatin (MYCOSTATIN) 100000 UNIT/ML suspension Use as directed 5 mLs in the mouth or throat 4 (four) times daily. Swish and spit   Yes [provider]  ondansetron (ZOFRAN) 8 MG tablet Take 1 tablet (8 mg total) by mouth every 8 (eight) hours as needed for nausea or vomiting. 10/14/17  Yes Heath Lark, MD  polyethylene glycol (MIRALAX /  GLYCOLAX) packet Take 17 g by mouth daily. 11/16/17  Yes Amin, Jeanella Flattery, MD  polyvinyl alcohol (LIQUIFILM TEARS) 1.4 % ophthalmic solution 1 drop 2 (two) times daily as needed for dry eyes.   Yes [provider]  potassium chloride (K-DUR) 10 MEQ tablet Take 10 mEq by mouth daily.   Yes [provider]  senna-docusate (SENOKOT-S) 8.6-50 MG tablet Take 1 tablet by mouth at bedtime. 07/24/17  Yes Gorsuch, Ni, MD  Tiotropium Bromide-Olodaterol (STIOLTO RESPIMAT IN) Inhale 2 puffs into the lungs daily.   Yes [provider]  XARELTO 20 MG TABS tablet TAKE 1 TABLET BY MOUTH EVERY DAY WITH SUPPER 04/23/17  Yes Satira Sark, MD  budesonide-formoterol [facility]) 160-4.5 MCG/ACT inhaler Inhale 2 puffs into the lungs 2 (two) times daily. Patient not taking: Reported on 12/17/2017 07/02/17   Collene Gobble, MD  diltiazem (CARTIA XT) 120 MG 24 hr capsule TAKE 1 CAPSULE BY MOUTH EVERY DAY Patient not taking: Reported on 12/17/2017  05/03/17   Satira Sark, MD  lidocaine-prilocaine (EMLA) cream Apply to affected area once 12/05/17   Heath Lark, MD  magic mouthwash w/lidocaine SOLN Take 5 mLs by mouth 4 (four) times daily. 5 ml QID swish and spit Patient not taking: Reported on 12/17/2017 10/17/17   Heath Lark, MD  ondansetron (ZOFRAN) 8 MG tablet Take 1 tablet (8 mg total) by mouth 2 (two) times daily as needed for refractory nausea / vomiting. Start on day 3 after chemo. Patient not taking: Reported on 12/17/2017 12/05/17   Heath Lark, MD  Oxycodone HCl 10 MG TABS Take 1 tablet (10 mg total) by mouth every 4 (four) hours as needed. Patient not taking: Reported on 12/17/2017 11/15/17   Damita Lack, MD  prochlorperazine (COMPAZINE) 10 MG tablet Take 1 tablet (10 mg total) by mouth every 6 (six) hours as needed (Nausea or vomiting). Patient not taking: Reported on 12/17/2017 12/05/17   Heath Lark, MD  Calcium Carbonate (CALCIUM 600) 1500 MG TABS Take 2 tablets by mouth daily.    12/18/11  [provider]    Family History Family History  Problem Relation Age of Onset  . Heart disease Father        pacemaker  . Lung cancer Father        hx of smoker  . Colon cancer Mother        dx in her mid 88's  . Colon cancer Maternal Grandfather   . Depression Sister     Social History Social History   Tobacco Use  . Smoking status: Former Smoker    Packs/day: 1.00    Years: 20.00    Pack years: 20.00    Types: Cigarettes    Last attempt to quit: 10/08/1980    Years since quitting: 37.2  . Smokeless tobacco: Never Used  Substance Use Topics  . Alcohol use: Yes    Comment: rarely, 12-23-15 rarely  . Drug use: No     Allergies   Codeine   Review of Systems Review of Systems  All other systems reviewed and are negative.    Physical Exam Updated Vital Signs BP (!) 126/53   Pulse 85   Temp 98 F (36.7 C)   Resp 20   SpO2 99%   Physical Exam  Constitutional: No distress.  HENT:  Head:  Normocephalic and atraumatic.  Right Ear: External ear normal.  Left Ear: External ear normal.  Eyes: Conjunctivae are normal. Right eye exhibits no  discharge. Left eye exhibits no discharge. No scleral icterus.  Neck: Neck supple. No tracheal deviation present.  Cardiovascular: Intact distal pulses. An irregularly irregular rhythm present. Tachycardia present.  Pulmonary/Chest: Effort normal and breath sounds normal. No stridor. No respiratory distress. She has no wheezes. She has no rales.  Abdominal: Soft. Bowel sounds are normal. She exhibits no distension. There is no tenderness. There is no rebound and no guarding.  Musculoskeletal: She exhibits no edema or tenderness.  Neurological: She is alert. She has normal strength. No cranial nerve deficit (no facial droop, extraocular movements intact, no slurred speech) or sensory deficit. She exhibits normal muscle tone. She displays no seizure activity. Coordination normal.  Skin: Skin is warm and dry. No rash noted. She is not diaphoretic.  Psychiatric: She has a normal mood and affect.  Nursing note and vitals reviewed.    ED Treatments / Results  Labs (all labs ordered are listed, but only abnormal results are displayed) Labs Reviewed  BASIC METABOLIC PANEL - Abnormal; Notable for the following components:      Result Value   Chloride 99 (*)    Glucose, Bld 102 (*)    Calcium 8.3 (*)    All other components within normal limits  CBC - Abnormal; Notable for the following components:   RBC 3.62 (*)    MCV 100.6 (*)    All other components within normal limits  I-STAT TROPONIN, ED    EKG  EKG Interpretation  Date/Time:  Tuesday December 17 2017 18:48:49 EDT Ventricular Rate:  168 PR Interval:    QRS Duration: 92 QT Interval:  266 QTC Calculation: 445 R Axis:   102 Text Interpretation:  atrial fibrillation with rapid rate Right axis deviation ST depression, probably rate related Confirmed by Dorie Rank [phone]) on 12/17/2017  6:56:03 PM      Repeat EKG at 20:14 NSR, Rate 83 Low voltage Nl intervals Nl ST T waves  Radiology Dg Chest Portable 1 View  Result Date: 12/17/2017 CLINICAL DATA:  84 year old female with tachycardia beginning about 1645 hours. History of atrial fibrillation and ovarian cancer. EXAM: PORTABLE CHEST 1 VIEW COMPARISON:  restaging CT chest abdomen and pelvis 11/28/2017 and earlier. FINDINGS: Portable AP semi upright view at 1842 hours. Stable right chest Port-A-Cath. Mediastinal contours are stable and within normal limits. Continued bilateral pleural effusions, which appear stable and small. No pneumothorax or pulmonary edema. No definite acute pulmonary opacity. IMPRESSION: 1. Small bilateral pleural effusions appear stable since February. 2. No new cardiopulmonary abnormality. Electronically Signed   By: Genevie Ann M.D.   On: 12/17/2017 19:14    Procedures .Critical Care Performed by: Dorie Rank, MD Authorized by: Dorie Rank, MD   Critical care provider statement:    Critical care time (minutes):  45   Critical care was time spent personally by me on the following activities:  Discussions with consultants, evaluation of patient's response to treatment, examination of patient, ordering and performing treatments and interventions, ordering and review of laboratory studies, ordering and review of radiographic studies, pulse oximetry, re-evaluation of patient's condition, obtaining history from patient or surrogate and review of old charts   (including critical care time)  Medications Ordered in ED Medications  0.9 %  sodium chloride infusion ( Intravenous New Bag/Given 12/17/17 1903)  diltiazem (CARDIZEM) 1 mg/mL load via infusion 20 mg (20 mg Intravenous Bolus from Bag 12/17/17 1904)    And  diltiazem (CARDIZEM) 100 mg in dextrose 5% 118m (1 mg/mL) infusion (0 mg/hr  Intravenous Stopped 12/17/17 2018)     Initial Impression / Assessment and Plan / ED Course  I have reviewed the triage  vital signs and the nursing notes.  Pertinent labs & imaging results that were available during my care of the patient were reviewed by me and considered in my medical decision making (see chart for details).  Clinical Course as of Dec 18 2131  Tue Dec 17, 2017  2006 Pt's appears to be back in NSR on the monitor.  Heart rate is below 100.  Will repeat ECG  [JK]  2124 Pt was monitored for over an hour off cardizem.  Tachycardia has not returned.    [JK]    Clinical Course User Index [JK] Dorie Rank, MD    Pt has history of a fib ([place] Vasc 3).  Presents to the ED with recurrent episode.  Resolved after cardizem in the ED.  Pt is on cardizem and xarelto.    Patient was monitored in the emergency room for an hour.  No recurrent episodes.  She feels well and is ready to go home.  Discussed outpatient follow-up with her cardiologist.  Final Clinical Impressions(s) / ED Diagnoses   Final diagnoses:  Atrial fibrillation with RVR [facility])    ED Discharge Orders    None       Dorie Rank, MD 12/17/17 2134

## 2017-12-18 ENCOUNTER — Other Ambulatory Visit: Payer: Self-pay | Admitting: *Deleted

## 2017-12-18 ENCOUNTER — Encounter: Payer: Self-pay | Admitting: *Deleted

## 2017-12-18 NOTE — Patient Outreach (Signed)
[facility]) Care Management  [facility] Social Work  12/18/2017  Maryln Eastham Don 07-26-1933 [id number]    Encounter Medications:  Outpatient Encounter Medications as of 12/18/2017  Medication Sig Note  . albuterol (PROAIR HFA) 108 (90 Base) MCG/ACT inhaler Inhale 2 puffs into the lungs every 6 (six) hours as needed for wheezing or shortness of breath.   . ALPRAZolam (XANAX) 0.25 MG tablet Take 1 tablet (0.25 mg total) by mouth at bedtime as needed for anxiety. 12/17/2017: End date: 12/24/17  . azelastine (ASTELIN) 0.1 % nasal spray Place 2 sprays into both nostrils 2 (two) times daily as needed for allergies.  12/17/2017: Start date: 11/20/2017  . budesonide-formoterol (SYMBICORT) 160-4.5 MCG/ACT inhaler Inhale 2 puffs into the lungs 2 (two) times daily. (Patient not taking: Reported on 12/17/2017)   . Cholecalciferol (VITAMIN D) 2000 units CAPS Take 2,000 Units by mouth daily.   Marland Kitchen diltiazem (CARTIA XT) 120 MG 24 hr capsule TAKE 1 CAPSULE BY MOUTH EVERY DAY (Patient not taking: Reported on 12/17/2017)   . diltiazem (DILACOR XR) 180 MG 24 hr capsule Take 180 mg by mouth daily.   . diphenhydrAMINE (BENADRYL) 25 mg capsule Take 25 mg by mouth every 6 (six) hours as needed for itching. 12/17/2017: Start date:12/17/2017  . diphenoxylate-atropine (LOMOTIL) 2.5-0.025 MG tablet Take 1 tablet by mouth 4 (four) times daily. (Patient taking differently: Take 1 tablet by mouth 4 (four) times daily as needed for diarrhea or loose stools. )   . fluticasone (FLONASE) 50 MCG/ACT nasal spray Place 2 sprays into both nostrils 2 (two) times daily. For nasal congestion   . furosemide (LASIX) 20 MG tablet Take 1 tablet (20 mg total) by mouth daily.   Marland Kitchen HYDROcodone-acetaminophen (NORCO/VICODIN) 5-325 MG tablet Take 2 tablets by mouth every 4 (four) hours as needed for moderate pain or severe pain. 12/17/2017: Start date:12/03/2017  . ipratropium-albuterol (DUONEB) 0.5-2.5 (3) MG/3ML SOLN Take 3 mLs by  nebulization 3 (three) times daily. (Patient taking differently: Take 3 mLs by nebulization every 6 (six) hours as needed (cough or SOB). As needed) 12/17/2017: Start date:12/02/17  . levothyroxine (SYNTHROID, LEVOTHROID) 137 MCG tablet Take 137 mcg by mouth every morning. For thyroid therapy   . lidocaine-prilocaine (EMLA) cream Apply 1 application topically as needed. 12/17/2017: Start date: 11/20/17  . lidocaine-prilocaine (EMLA) cream Apply to affected area once   . loperamide (IMODIUM) 2 MG capsule Take 2 capsules (4 mg total) by mouth 3 (three) times daily.   . magic mouthwash w/lidocaine SOLN Take 5 mLs by mouth 4 (four) times daily. 5 ml QID swish and spit (Patient not taking: Reported on 12/17/2017)   . mirtazapine (REMERON) 15 MG tablet Take 1 tablet (15 mg total) by mouth at bedtime.   Marland Kitchen nystatin (MYCOSTATIN) 100000 UNIT/ML suspension Use as directed 5 mLs in the mouth or throat 4 (four) times daily. Swish and spit   . ondansetron (ZOFRAN) 8 MG tablet Take 1 tablet (8 mg total) by mouth every 8 (eight) hours as needed for nausea or vomiting.   . ondansetron (ZOFRAN) 8 MG tablet Take 1 tablet (8 mg total) by mouth 2 (two) times daily as needed for refractory nausea / vomiting. Start on day 3 after chemo. (Patient not taking: Reported on 12/17/2017)   . Oxycodone HCl 10 MG TABS Take 1 tablet (10 mg total) by mouth every 4 (four) hours as needed. (Patient not taking: Reported on 12/17/2017)   . polyethylene glycol (MIRALAX / GLYCOLAX) packet Take 17  g by mouth daily.   . polyvinyl alcohol (LIQUIFILM TEARS) 1.4 % ophthalmic solution 1 drop 2 (two) times daily as needed for dry eyes. 12/17/2017: Start date: 11/15/2017  . potassium chloride (K-DUR) 10 MEQ tablet Take 10 mEq by mouth daily.   . prochlorperazine (COMPAZINE) 10 MG tablet Take 1 tablet (10 mg total) by mouth every 6 (six) hours as needed (Nausea or vomiting). (Patient not taking: Reported on 12/17/2017)   . senna-docusate (SENOKOT-S) 8.6-50  MG tablet Take 1 tablet by mouth at bedtime.   . Tiotropium Bromide-Olodaterol (STIOLTO RESPIMAT IN) Inhale 2 puffs into the lungs daily.   Alveda Reasons 20 MG TABS tablet TAKE 1 TABLET BY MOUTH EVERY DAY WITH SUPPER   . [DISCONTINUED] Calcium Carbonate (CALCIUM 600) 1500 MG TABS Take 2 tablets by mouth daily.      No facility-administered encounter medications on file as of 12/18/2017.     Functional Status:  In your present state of health, do you have any difficulty performing the following activities: 12/18/2017 11/08/2017  Hearing? Tempie Donning  Vision? Y N  Difficulty concentrating or making decisions? Y N  Walking or climbing stairs? Y Y  Dressing or bathing? Y Y  Doing errands, shopping? Tempie Donning  Some recent data might be hidden    Fall/Depression Screening:  PHQ 2/9 Scores 12/18/2017  PHQ - 2 Score 4  PHQ- 9 Score 11    Assessment:  CSW had recevied referral from [place] that patient is an 84 year old with past medical history of ovarian cancer on chemo, atrial fibrillation on xarelto, COPD, depression, fibromyalgia. Patient was in the hospital for a right pleural effusion, pneumonia and afib. Patient is on continuous oxygen and has a private duty aide that comes in during the day Monday - Friday and a daughter, [name] who checks in on her as well. Patient was admitted to [facility] on 11/15/17 and plans to discharge home on 12/19/17. Patient states that she feels comfortable with returning home alone and having her daughter checking in and they are trying to see if the same aide, Marliss Coots would be able to come back to her or if she would need a new one. Patient did score 11 on PHQ-9 which shows moderate depression, which she attributes to fatigue and overall feeling down due to chemo. Patient is taking xanax and remeron which she states has been taking for the past few months.   Plan:  CSW will make referral to [facility] for transition of care follow-up once discharged from SNF  (possibly 12/19/17). CSW will follow-up with patient in 2 weeks to ensure smooth transition home.   [facility] CM Care Plan Problem One     Most Recent Value  Care Plan Problem One  Patient at [facility] for short term rehab with plans to return home  Role Documenting the Problem One  Clinical Social Worker  Care Plan for Problem One  Active  [facility] Long Term Goal   Patient plans to return home upon completion of rehab within the next 31 days.   [facility] Long Term Goal Start Date  12/19/17  Interventions for Problem One Long Term Goal  CSW encouraged patient to continue to work with PT and OT while at SNF to increase her strength & endurance.        Raynaldo Opitz, LCSW [facility]  Clinical Social Worker cell #: [phone]

## 2017-12-20 ENCOUNTER — Telehealth: Payer: Self-pay | Admitting: Cardiology

## 2017-12-20 ENCOUNTER — Other Ambulatory Visit: Payer: Self-pay | Admitting: *Deleted

## 2017-12-20 NOTE — Telephone Encounter (Signed)
I will send FYI to [place]

## 2017-12-20 NOTE — Telephone Encounter (Signed)
I spoke with daughter and she is aware of new apt on 01/08/18 at 230 pm with Bernerd Pho PA-C

## 2017-12-20 NOTE — Telephone Encounter (Signed)
I reviewed the chart and recent ECGs.  It looks like she was in an SVT or possibly atypical atrial flutter rather than atrial fibrillation.  For now I would continue with current dose of diltiazem.  We could try to increase this further if needed, but blood pressure may be a limitation.  Alternatively, we might have to consider antiarrhythmic therapy such as amiodarone, but I would prefer not to complicate her medications any further if possible.  Please make sure that she has a follow-up visit scheduled.

## 2017-12-20 NOTE — Addendum Note (Signed)
Addended by: Raynaldo Opitz F on: 12/20/2017 03:45 PM   Modules accepted: Orders

## 2017-12-20 NOTE — Telephone Encounter (Signed)
Pt was seen in the ER at [facility] on Tuesday night for her Afib, pt's daughter [name] was wanting Dr. Domenic Polite to be aware incase there needs to be some med changes. Can be reached @ [phone]

## 2017-12-23 ENCOUNTER — Other Ambulatory Visit: Payer: Self-pay | Admitting: *Deleted

## 2017-12-23 ENCOUNTER — Telehealth: Payer: Self-pay

## 2017-12-23 ENCOUNTER — Encounter (HOSPITAL_COMMUNITY): Payer: Self-pay

## 2017-12-23 ENCOUNTER — Ambulatory Visit (HOSPITAL_COMMUNITY): Payer: Medicare Other

## 2017-12-23 ENCOUNTER — Other Ambulatory Visit: Payer: Self-pay

## 2017-12-23 ENCOUNTER — Ambulatory Visit (HOSPITAL_COMMUNITY): Payer: Medicare Other | Attending: Hematology and Oncology

## 2017-12-23 DIAGNOSIS — M79662 Pain in left lower leg: Secondary | ICD-10-CM | POA: Diagnosis present

## 2017-12-23 DIAGNOSIS — M6281 Muscle weakness (generalized): Secondary | ICD-10-CM | POA: Diagnosis not present

## 2017-12-23 DIAGNOSIS — R2689 Other abnormalities of gait and mobility: Secondary | ICD-10-CM

## 2017-12-23 MED ORDER — DULOXETINE HCL 30 MG PO CPEP: 30.0000 mg | ORAL_CAPSULE | Freq: Every day | ORAL | 0 refills | Status: DC

## 2017-12-23 NOTE — Telephone Encounter (Signed)
Called with below message. Verbalized understanding. She is unable to take Gabapentin due to it causing swelling in the past. Any suggestion for the neuropathy?

## 2017-12-23 NOTE — Telephone Encounter (Signed)
Daughter called and left message. She thinks that her Moms antidepressant is not working. Can the dosage be increased? Her neuropathy seems to be getting worse. Can she take anything besides Gabapentin?

## 2017-12-23 NOTE — Patient Instructions (Signed)
  SUPINE HIP ABDUCTION - ELASTIC BAND CLAMS - CLAMSHELL  Lie down on your back with your knees bent. Place an elastic band around your knees and then draw your knees apart.  Perform 1-2x/day, 2-3 sets of 10-15 reps on each leg

## 2017-12-23 NOTE — Telephone Encounter (Signed)
Called with below message. Rx sent to pharmacy. Verbalized understanding.

## 2017-12-23 NOTE — Telephone Encounter (Signed)
I do not treat depression in general. Most antidepressants can cause other side-effects. I can address this when I see her Gabapentin can be increased to 900 mg TID but constipation, nausea and sedation can happen If she is interested to try, we can increase the dose slowly

## 2017-12-23 NOTE — Telephone Encounter (Signed)
We can try cymbalta. It may also help with depression We can start with 30 mg. Can you call in to her preferred pharmacy?

## 2017-12-23 NOTE — Therapy (Signed)
[place], [place], [address] Phone: [phone]   Fax:  [phone]  Occupational Therapy Treatment  Patient Details  Name: [name] MRN: [id number] Date of Birth: July 16, 1933 Referring Provider: Dr. Heath Lark   Encounter Date: 12/23/2017  OT End of Session - 12/23/17 1506    Visit Number  2    Number of Visits  8    Authorization Type  Medicare Part B    OT Start Time  0626 reassessment and discharge    OT Stop Time  1338    OT Time Calculation (min)  33 min    Activity Tolerance  Patient tolerated treatment well    Behavior During Therapy  [facility] for tasks assessed/performed       Past Medical History:  Diagnosis Date  . COPD (chronic obstructive pulmonary disease) ([place])   . Depression   . Diverticulosis   . Family hx of colon cancer   . Fibromyalgia   . GERD (gastroesophageal reflux disease)   . Hemorrhoids   . Hiatal hernia   . History of shingles   . Hypothyroidism   . IBS (irritable bowel syndrome)   . Lymphocytic colitis   . Osteoporosis   . Ovarian cancer ([place])   . Paroxysmal atrial fibrillation ([facility])   . Psoriatic arthritis ([place])   . Rectal polyp   . Schatzki's ring   . Uterine polyp     Past Surgical History:  Procedure Laterality Date  . CHOLECYSTECTOMY  Dec 04, 2010  . COLONOSCOPY    . FEMUR IM NAIL Left 12/11/2013   Procedure: INTRAMEDULLARY (IM) NAIL FEMORAL;  Surgeon: [name], MD;  Location: [facility] ORS;  Service: Orthopedics;  Laterality: Left;  . FRACTURE SURGERY Left 12/11/2013   hip  . IR FLUORO GUIDE PORT INSERTION RIGHT  07/22/2017  . IR PARACENTESIS  07/12/2017  . IR US GUIDE VASC ACCESS RIGHT  07/22/2017  . KNEE SURGERY Left    x 2  . TOTAL KNEE REVISION  08/06/2012   Procedure: TOTAL KNEE REVISION;  Surgeon: [name], MD;  Location: [facility] ORS;  Service: Orthopedics;  Laterality: Left;  Left Total Knee Arthroplasty Revision  . Uterine polypectomy       There were no vitals filed for this visit.  Subjective Assessment - 12/23/17 1458    Subjective   S: I try to do as much as I can myself.     Patient is accompained by:  Family member caregiver also    Currently in Pain?  No/denies         [facility] OT Assessment - 12/23/17 1311      Assessment   Medical Diagnosis  Generalized Weakness    Referring Provider  Dr. Heath Lark    Prior Therapy  Pt recently discharge from 35 day at SNF ([facility]) for skilled therapy after recent hospitalization.      Precautions   Precautions  Other (comment)    Precaution Comments  active cancer      Prior Function   Level of Independence  Independent      ROM / Strength   AROM / PROM / Strength  AROM;Strength      AROM   Overall AROM Comments  BUE A/ROM is [facility]      Strength   Strength Assessment Site  Shoulder    Right/Left Shoulder  Right;Left    Right Shoulder Flexion  4+/5 previous: 4/5    Right  Shoulder ABduction  4+/5 previous: 4/5    Right Shoulder Internal Rotation  4+/5 previous: 4/5    Right Shoulder External Rotation  4/5 pervious: 4/5    Left Shoulder Flexion  4+/5 previous: 4/5    Left Shoulder ABduction  4+/5 previous: 4/5    Left Shoulder Internal Rotation  4+/5 previous: 4/5    Left Shoulder External Rotation  4/5 previous: 4/5      Hand Function   Right Hand Grip (lbs)  55 previous: 45    Right Hand Lateral Pinch  12 lbs previous: same    Right Hand 3 Point Pinch  12 lbs previous: same    Left Hand Grip (lbs)  50 previous: 45    Left Hand Lateral Pinch  11 lbs previous: 12    Left 3 point pinch  10 lbs previous: 12                       OT Education - 12/23/17 1503    Education provided  Yes    Education Details  Energy conservation techniques, reviewed HEP from evaluation. Discussed therapy goals and patient's wish to just focus on PT.     Person(s) Educated  Patient    Methods  Explanation;Demonstration;Verbal cues;Handout    Comprehension   Returned demonstration;Verbalized understanding       OT Short Term Goals - 12/23/17 1508      OT SHORT TERM GOAL #1   Title  Patient will be educated on a HEP for improved upper body sustained activity tolerance.     Time  4    Period  Weeks    Status  Achieved      OT SHORT TERM GOAL #2   Title  Patient will be educated on and independently apply energy conservation prinicples when completing B/IADLs.     Time  4    Period  Weeks    Status  Achieved      OT SHORT TERM GOAL #3   Title  Patient will independently prepare a meal of cornbread and pintos utilizing energy conservation techniques.     Time  4    Period  Weeks    Status  Not Met      OT SHORT TERM GOAL #4   Title  Patient will improve upper body sustained activity tolerance from fair to good - for increased independence and safety with ADL completion.     Time  4    Period  Weeks    Status  Achieved      OT SHORT TERM GOAL #5   Title  Patient will be able to complete B/ADLs and IADLS at previous level of independence safely and independently.     Time  4    Period  Weeks    Status  Not Met               Plan - 12/23/17 1507    Clinical Impression Statement  A: Patient and daughter present during reassessment as patient was recently in skilled nursing after recent hospitalization. patient and daughter voiced possibly continuing to complete a HEP at home independently so they can focus on PT. At this point, patient has met 3/5 therapy goals and she will continue working on the remainder goals at home.     Plan  P: D/C with HEP     Consulted and Agree with Plan of Care  Patient;Family member/caregiver    Family Member Consulted  daughter       Patient will benefit from skilled therapeutic intervention in order to improve the following deficits and impairments:  Pain, Cardiopulmonary status limiting activity, Decreased activity tolerance, Impaired perceived functional ability  Visit Diagnosis: Muscle  weakness (generalized)    Problem List Patient Active Problem List   Diagnosis Date Noted  . Anemia, chronic disease 12/13/2017  . Mild protein-calorie malnutrition ([place]) 12/13/2017  . Peripheral neuropathy due to chemotherapy ([place]) 12/13/2017  . S/P thoracentesis   . Atrial fibrillation with RVR ([place]) 11/08/2017  . HCAP (healthcare-associated pneumonia) 11/08/2017  . Pleural effusion on right 09/20/2017  . Acute on chronic diastolic heart failure ([place]) 08/19/2017  . Pancytopenia, acquired ([place]) 08/15/2017  . Protein-calorie malnutrition, moderate ([place]) 07/30/2017  . Generalized weakness 07/30/2017  . Antineoplastic chemotherapy induced pancytopenia ([place]) 07/20/2017  . Ovarian CA, right ([place]) 07/18/2017  . PNA (pneumonia) 07/07/2017  . Ascites 06/30/2017  . Ascites, malignant 06/30/2017  . Right ovarian cyst 01/18/2017  . Elevated CA-125 01/18/2017  . Depression 12/23/2015  . Peripheral edema 08/23/2015  . Fracture of hip, left, closed ([place]) 12/08/2013  . Hip fracture ([place]) 12/08/2013  . PAD (peripheral artery disease) ([place]) 11/25/2013  . Encounter for therapeutic drug monitoring 11/09/2013  . Atrial fibrillation ([place]) 05/07/2013  . Cough 03/20/2013  . Total knee replacement status 10/07/2012  . Knee pain 10/07/2012  . Knee stiffness 10/07/2012  . Tachycardia 09/17/2012  . Chest pain 09/17/2012  . Difficulty in walking(719.7) 09/16/2012  . Muscle weakness (generalized) 09/16/2012  . Postop Acute blood loss anemia 08/08/2012  . Instability of prosthetic knee ([place]) 08/06/2012  . Bloating 02/14/2012  . Upper abdominal pain 02/14/2012  . Allergic rhinitis, seasonal 02/15/2011  . COLITIS 02/20/2010  . Diarrhea 01/02/2010  . ABDOMINAL PAIN -GENERALIZED 01/02/2010  . PERSONAL HX COLONIC POLYPS 01/02/2010  . HYPOTHYROIDISM 12/28/2009  . COPD (chronic obstructive pulmonary disease) ([place]) 12/28/2009  . ARTHRITIS 12/28/2009    OCCUPATIONAL THERAPY DISCHARGE  SUMMARY  Visits from Start of Care: 2  Current functional level related to goals / functional outcomes: See above   Remaining deficits: See above   Education / Equipment: See above Plan: Patient agrees to discharge.  Patient goals were partially met. Patient is being discharged due to being pleased with the current functional level.  ?????         Ailene Ravel, OTR/L,CBIS  [phone]  12/23/2017, 3:11 PM  [place] [address] [place], [place], [address] Phone: [phone]   Fax:  [phone]  Name: SEELEY SOUTHGATE MRN: [id number] Date of Birth: 03-Sep-1933

## 2017-12-23 NOTE — Patient Outreach (Addendum)
[facility]) Care Management  12/23/2017  Raylyn Carton Roca [birth year] [id number]   Care coordination  CM received a [facility] CM referral from [facility] SW on 12/20/17 for transition of care Per Referral pt with recent d/c from [facility] Cm called 12/23/17 to home number listed but her daughter donna answered and requested CM to call the actual home number for the pt as [phone].   [name] reports the home number in EPIC is Donna's because of the snf stay. [name] does not want Cm to update the numbers because of their preference.  [name] reports pt is hard of hearing and if she does not answer her home for Cm to try the mobile number listed in Epic for the pt. [name] confirmed this mobile number to be correct   Spoke with Mrs Silverio who confirms [facility] starting this week. Agreed on a home visit this week Confirms she has all the DME, medicines and appointments needed Denies any medical concerns at this time Prefers not to have a follow up appointment with primary MD, Dr Willey Blade at this time States she discussed follow hospitial/snf appointment previously with Dr Willey Blade and Particia Nearing his RN Has CV f/u appointment 01/08/18 with PA at 1430   Plan home visit with patient this week   Joelene Millin L. Lavina Hamman, RN, BSN, [place] Coordinator [phone] week day mobile

## 2017-12-23 NOTE — Patient Instructions (Signed)
[email or web address]://patienteducation.[email or web address].com.cy : Energy Conservation handout  Occupational Therapy Exercises  1. Both arms straight out in front of you, keep your elbows straight do not let them bend, the criss/cross your arms in front of you. 2. Both arms straight out in front of you, keep elbows straight.  Bend your wrist back and make small circles in the air.  Go in one direction the go in the other (Like you are waxing something) 3. Both arms straight out in front of you, keep elbows straight.  Bend your wrists back and move your arm up while the left arm goes down as quick as you can. 4. With your right/left arm straight out in front of you, keep your elbow straight.  Write your name in the air 3-4 times quickly. 5. Put both of your hands on your lap with your palms down, turn your palms up and down as fast as you can. 6. Put both of your hands on your lap keeping your wrists on your lap at all times.  No pat on your lap quickly alternating right/left. 7. Put your hands on your lap and drum your fingers.  Make sure your fingers are moving individually. 8. Play with a deck of cards. (Shuffle, deal cards, turn cards over pick up off table one by one)   Do the above exercises at least 2 times a day, completing each exercise 10 times.

## 2017-12-23 NOTE — Therapy (Signed)
[place], [place], [address] Phone: [phone]   Fax:  [phone]  Physical Therapy Treatment/Reassessment  Patient Details  Name: [name] MRN: [id number] Date of Birth: Jul 30, 1933 Referring Provider: Dr. Heath Lark   Encounter Date: 12/23/2017  PT End of Session - 12/23/17 1350    Visit Number  1    Number of Visits  18    Date for PT Re-Evaluation  02/03/18 mini reassess on 01/13/18    Authorization Type  Medicare    Authorization Time Period  12/23/17 to 02/03/18    PT Start Time  1346    PT Stop Time  1427    PT Time Calculation (min)  41 min    Equipment Utilized During Treatment  Oxygen    Activity Tolerance  Patient tolerated treatment well;Patient limited by fatigue    Behavior During Therapy  [facility] for tasks assessed/performed       Past Medical History:  Diagnosis Date  . COPD (chronic obstructive pulmonary disease) ([place])   . Depression   . Diverticulosis   . Family hx of colon cancer   . Fibromyalgia   . GERD (gastroesophageal reflux disease)   . Hemorrhoids   . Hiatal hernia   . History of shingles   . Hypothyroidism   . IBS (irritable bowel syndrome)   . Lymphocytic colitis   . Osteoporosis   . Ovarian cancer ([place])   . Paroxysmal atrial fibrillation (HCC)   . Psoriatic arthritis ([place])   . Rectal polyp   . Schatzki's ring   . Uterine polyp     Past Surgical History:  Procedure Laterality Date  . CHOLECYSTECTOMY  Dec 04, 2010  . COLONOSCOPY    . FEMUR IM NAIL Left 12/11/2013   Procedure: INTRAMEDULLARY (IM) NAIL FEMORAL;  Surgeon: [name], MD;  Location: [facility];  Service: Orthopedics;  Laterality: Left;  . FRACTURE SURGERY Left 12/11/2013   hip  . IR FLUORO GUIDE PORT INSERTION RIGHT  07/22/2017  . IR PARACENTESIS  07/12/2017  . IR US GUIDE VASC ACCESS RIGHT  07/22/2017  . KNEE SURGERY Left    x 2  . TOTAL KNEE REVISION  08/06/2012   Procedure: TOTAL KNEE REVISION;   Surgeon: [name], MD;  Location: [facility];  Service: Orthopedics;  Laterality: Left;  Left Total Knee Arthroplasty Revision  . Uterine polypectomy      There were no vitals filed for this visit.  Subjective Assessment - 12/23/17 1347    Subjective  Pt states that she is feeling "draggy" today. She has been in rehab for the last 35 days. She states that it went well. She worked on her legs, arms, balance, and on the Nustep every day. She states that she is still having difficulty with her endurance. She said that her standing and walking are limited due to her BLE weakness, but per her and her dtr, this has improved.     Limitations  Walking;Standing    How long can you sit comfortably?  not limited    How long can you stand comfortably?  a few mins    How long can you walk comfortably?  89f or > at the nursing home    Patient Stated Goals  Would like to come to clinic to build up her strength and endurance    Currently in Pain?  No/denies           [facility] -  12/23/17 1352      Assessment   Medical Diagnosis  Generalized Weakness    Referring Provider  Dr. Heath Lark    Hand Dominance  Right    Prior Therapy  pt d/c from SNF on 12/19/17      ROM / Strength   AROM / PROM / Strength  Strength      Strength   Right Hip Flexion  4/5 was 4-    Right Hip Extension  3+/5 was 3    Right Hip ABduction  4/5 was 4-    Left Hip Flexion  4/5 was 4-    Left Hip Extension  2+/5    Left Hip ABduction  3+/5 was 3+    Right Knee Flexion  4/5 was 4+    Right Knee Extension  5/5 was 3    Left Knee Flexion  4/5 was 4-    Left Knee Extension  4/5 was 3    Right Ankle Dorsiflexion  4/5 was 4    Left Ankle Dorsiflexion  4/5 was 4      Ambulation/Gait   Ambulation Distance (Feet)  226 Feet 3MWT    Assistive device  Rolling walker    Gait Comments  with one seated rest break and required last :50 of time for rest; on RA; O2 99% afterwards, HR 110      Balance   Balance  Assessed  Yes      Static Standing Balance   Static Standing - Balance Support  No upper extremity supported    Static Standing Balance -  Activities   Single Leg Stance - Right Leg;Single Leg Stance - Left Leg    Static Standing - Comment/# of Minutes  R: 7 sec, Bil HHA; L: 10 sec bil HHA      Standardized Balance Assessment   Standardized Balance Assessment  Five Times Sit to Stand    Five times sit to stand comments   20.6, chair, no UE            PT Education - 12/23/17 1428    Education provided  Yes    Education Details  reassessment findings; HEP, POC    Person(s) Educated  Patient;Child(ren) dtr    Methods  Explanation;Demonstration;Handout    Comprehension  Verbalized understanding;Returned demonstration       PT Short Term Goals - 12/23/17 1350      PT SHORT TERM GOAL #1   Title  Patient will ambulated >250 feet with rolling walker during the 3 MWT to improve her ability to ambulate further within her home before needing to rest.    Baseline  3/18: 89f with RW    Time  3    Period  Weeks    Status  On-going    Target Date  01/13/18      PT SHORT TERM GOAL #2   Title  Patient with tolerate the standing position with upper extremity support for 5 minutes before needing to rest to improve her ability to perform standing functional activities and ADLs within her home environment.    Baseline  3/18: able to stand 5 mins with no UE support    Time  3    Period  Weeks    Status  Achieved      PT SHORT TERM GOAL #3   Title  Patient will increase MMT grades by 1/2 grade to show improvement in her strength and stability to perform functional activities.    Baseline  3/18: see MMT    Time  3    Period  Weeks    Status  Partially Met      PT SHORT TERM GOAL #4   Title  Patient will be able to stand on either foot for 10 seconds to exhibit improved balance and decreased risk for falls.    Baseline  3/18: R: 7 sec with bil HHA, L: 10 sec with bil HHA    Time  3     Period  Weeks    Status  On-going        PT Long Term Goals - 12/23/17 1351      PT LONG TERM GOAL #1   Title  Patient will ambulated >500 feet with rolling walker during the 3 MWT to improve her ability to ambulate further  in community to allow to her go enjoy going out for a meal.    Baseline  3/18: 49f with RA    Time  6    Period  Weeks    Status  On-going    Target Date  02/03/18      PT LONG TERM GOAL #2   Title  Patient with tolerate the standing position with upper extremity support for 10 minutes before needing to rest to improve her ability to stand and have a conversation with a friend or relative.    Baseline  3/18: able to stand 5 mins with no UE support    Time  6    Period  Weeks    Status  On-going      PT LONG TERM GOAL #3   Title  Patient will increase MMT grades by 1 grade to show continued improvement in her strength and stability to perform functional activities at home independently.    Baseline  3/18: see MMT    Time  6    Period  Weeks    Status  Partially Met      PT LONG TERM GOAL #4   Title  Patient will be able to stand on either foot for 15 seconds to exhibit improved balance and further decreased risk for falls.    Baseline  3/18: R: 7 sec bil HHA, L: 10 sec bil HHA    Time  6    Period  Weeks    Status  On-going      PT LONG TERM GOAL #5   Title  Pt will be able to perform 5xSTS in 12 sec or < with no UE in order to demo improved functional BLE strength and overall balance.    Time  6    Period  Weeks    Status  New      Additional Long Term Goals   Additional Long Term Goals  Yes      PT LONG TERM GOAL #6   Title  Pt will report requiring assistance for ADLs and IADLs at home 25% of the time or < to demonstrate improved overall functional strength, endurance, and balance in order to promote independence at home.    Time  6    Period  Weeks    Status  New    Target Date  02/03/18            Plan - 12/23/17 1437    Clinical  Impression Statement  Pt returns to therapy for the first time since 11/05/17 as she has been in and out of the hospital and was recently in SNF for 35  days; she was d/c on 12/19/17. Pt continues with same complaints as previous OPPT POC stating that her legs are weak and her endurance is deficient. Her overall MMT has improved since her evaluation in January but she is still generally weak, especially in her proximal hip musculature. Pt able to ambulate 269f during 3MWT this date but required remaining 50seconds of test for seated rest break due to fatigue. She also required 20 sec to perform 5xSTS, indicating balance and functional strength deficiencies. Pt needs skilled PT intervention to address these impairments in order to maximize overall strength and promote independence at home. PT updated goals this date.    Rehab Potential  Good    PT Frequency  3x / week    PT Duration  6 weeks    PT Treatment/Interventions  ADLs/Self Care Home Management;Gait training;Stair training;Functional mobility training;Therapeutic activities;Patient/family education;Balance training;Neuromuscular re-education;Therapeutic exercise;Manual techniques;Energy conservation    PT Next Visit Plan  Allow adequate rest breaks for patient; perform mixed standing and supine strengthening exercises for hips/glutes. NuStep for BLE strengthening and cardiovascular endurance.    PT Home Exercise Plan  intial - lap walking at home; supine bridges; 3/18: supine clams with RTB    Consulted and Agree with Plan of Care  Patient       Patient will benefit from skilled therapeutic intervention in order to improve the following deficits and impairments:  Abnormal gait, Pain, Decreased mobility, Decreased coordination, Decreased activity tolerance, Decreased endurance, Decreased strength, Decreased balance, Difficulty walking  Visit Diagnosis: Muscle weakness (generalized) - Plan: PT plan of care cert/re-cert  Pain in left lower leg -  Plan: PT plan of care cert/re-cert  Other abnormalities of gait and mobility - Plan: PT plan of care cert/re-cert     Problem List Patient Active Problem List   Diagnosis Date Noted  . Anemia, chronic disease 12/13/2017  . Mild protein-calorie malnutrition ([facility] 12/13/2017  . Peripheral neuropathy due to chemotherapy ([facility] 12/13/2017  . S/P thoracentesis   . Atrial fibrillation with RVR ([place] 11/08/2017  . HCAP (healthcare-associated pneumonia) 11/08/2017  . Pleural effusion on right 09/20/2017  . Acute on chronic diastolic heart failure ([facility] 08/19/2017  . Pancytopenia, acquired ([facility] 08/15/2017  . Protein-calorie malnutrition, moderate ([place] 07/30/2017  . Generalized weakness 07/30/2017  . Antineoplastic chemotherapy induced pancytopenia ([facility] 07/20/2017  . Ovarian CA, right ([facility] 07/18/2017  . PNA (pneumonia) 07/07/2017  . Ascites 06/30/2017  . Ascites, malignant 06/30/2017  . Right ovarian cyst 01/18/2017  . Elevated CA-125 01/18/2017  . Depression 12/23/2015  . Peripheral edema 08/23/2015  . Fracture of hip, left, closed ([place] 12/08/2013  . Hip fracture (HC[place] 12/08/2013  . PAD (peripheral artery disease) ([place] 11/25/2013  . Encounter for therapeutic drug monitoring 11/09/2013  . Atrial fibrillation ([facility] 05/07/2013  . Cough 03/20/2013  . Total knee replacement status 10/07/2012  . Knee pain 10/07/2012  . Knee stiffness 10/07/2012  . Tachycardia 09/17/2012  . Chest pain 09/17/2012  . Difficulty in walking(719.7) 09/16/2012  . Muscle weakness (generalized) 09/16/2012  . Postop Acute blood loss anemia 08/08/2012  . Instability of prosthetic knee (HD[place] 08/06/2012  . Bloating 02/14/2012  . Upper abdominal pain 02/14/2012  . Allergic rhinitis, seasonal 02/15/2011  . COLITIS 02/20/2010  . Diarrhea 01/02/2010  . ABDOMINAL PAIN -GENERALIZED 01/02/2010  . PERSONAL HX COLONIC POLYPS 01/02/2010  . HYPOTHYROIDISM 12/28/2009  . COPD (chronic obstructive pulmonary disease)  ([place] 12/28/2009  . ARTHRITIS 12/28/2009       BGeraldine SolarPT, DPT  [place], [place], [address] Phone: [phone]   Fax:  [phone]  Name: [name] MRN: [id number] Date of Birth: [birth year]

## 2017-12-25 ENCOUNTER — Ambulatory Visit (HOSPITAL_COMMUNITY): Payer: Medicare Other

## 2017-12-25 ENCOUNTER — Encounter (HOSPITAL_COMMUNITY): Payer: Self-pay

## 2017-12-25 ENCOUNTER — Telehealth (HOSPITAL_COMMUNITY): Payer: Self-pay

## 2017-12-25 DIAGNOSIS — R2689 Other abnormalities of gait and mobility: Secondary | ICD-10-CM

## 2017-12-25 DIAGNOSIS — M6281 Muscle weakness (generalized): Secondary | ICD-10-CM | POA: Diagnosis not present

## 2017-12-25 DIAGNOSIS — M79662 Pain in left lower leg: Secondary | ICD-10-CM

## 2017-12-25 NOTE — Patient Instructions (Signed)
  BRIDGING  While lying on your back with knees bent, tighten your lower abdominals, squeeze your buttocks and then raise your buttocks off the floor/bed as creating a "Bridge" with your body. Hold and then lower yourself and repeat.  Perform 1-2x/day, 2-3 sets of 10-15 reps

## 2017-12-25 NOTE — Telephone Encounter (Signed)
Caregiver cx 3/29 due to patient having Chemo on that afternoon.

## 2017-12-25 NOTE — Therapy (Signed)
[place], [place], [address] Phone: [phone]   Fax:  [phone]  Physical Therapy Treatment  Patient Details  Name: [name] MRN: [id number] Date of Birth: 1932/10/23 Referring Provider: Dr. Heath Lark   Encounter Date: 12/25/2017  PT End of Session - 12/25/17 1300    Visit Number  2    Number of Visits  18    Date for PT Re-Evaluation  02/03/18 mini reassess on 01/13/18    Authorization Type  Medicare    Authorization Time Period  12/23/17 to 02/03/18    PT Start Time  1300    PT Stop Time  1342    PT Time Calculation (min)  42 min    Equipment Utilized During Treatment  Oxygen    Activity Tolerance  Patient tolerated treatment well;Patient limited by fatigue    Behavior During Therapy  [facility] for tasks assessed/performed       Past Medical History:  Diagnosis Date  . COPD (chronic obstructive pulmonary disease) ([place])   . Depression   . Diverticulosis   . Family hx of colon cancer   . Fibromyalgia   . GERD (gastroesophageal reflux disease)   . Hemorrhoids   . Hiatal hernia   . History of shingles   . Hypothyroidism   . IBS (irritable bowel syndrome)   . Lymphocytic colitis   . Osteoporosis   . Ovarian cancer ([place])   . Paroxysmal atrial fibrillation (HCC)   . Psoriatic arthritis ([place])   . Rectal polyp   . Schatzki's ring   . Uterine polyp     Past Surgical History:  Procedure Laterality Date  . CHOLECYSTECTOMY  Dec 04, 2010  . COLONOSCOPY    . FEMUR IM NAIL Left 12/11/2013   Procedure: INTRAMEDULLARY (IM) NAIL FEMORAL;  Surgeon: [name], MD;  [facility];  Service: Orthopedics;  Laterality: Left;  . FRACTURE SURGERY Left 12/11/2013   hip  . IR FLUORO GUIDE PORT INSERTION RIGHT  07/22/2017  . IR PARACENTESIS  07/12/2017  . IR US GUIDE VASC ACCESS RIGHT  07/22/2017  . KNEE SURGERY Left    x 2  . TOTAL KNEE REVISION  08/06/2012   Procedure: TOTAL KNEE REVISION;  Surgeon:  [name], MD;  [facility];  Service: Orthopedics;  Laterality: Left;  Left Total Knee Arthroplasty Revision  . Uterine polypectomy      There were no vitals filed for this visit.  Subjective Assessment - 12/25/17 1301    Subjective  Pt reports that she's a little SOB today. She feels like she holds her breath/tenses up when she's walking and thinks that might be what does it. She report trying to stand for 5 mins straight but her bottom started burning.    Limitations  Walking;Standing    How long can you sit comfortably?  not limited    How long can you stand comfortably?  a few mins    How long can you walk comfortably?  89f or > at the nursing home    Patient Stated Goals  Would like to come to clinic to build up her strength and endurance    Currently in Pain?  No/denies           [facility] Treatment/Exercise - 12/25/17 0001      Knee/Hip Exercises: Standing   Heel Raises  Both;10 reps;Limitations    Heel Raises Limitations  heel and toe; BUE support  Knee Flexion  Both;10 reps      Knee/Hip Exercises: Seated   Long Arc Quad  Both;2 sets;10 reps;Limitations    Long Arc Quad Limitations  2-3" holds    Marching  Both;2 sets;10 reps    Sit to [employer]  10 reps;without UE support      Knee/Hip Exercises: Supine   Short Arc [employer]  Both;2 sets;10 reps;Limitations    Short Arc [employer] Limitations  3" holds    Bridges  Both;2 sets;10 reps    Bridges Limitations  cues for proper technique    Straight Leg Raises  Both;2 sets;10 reps    Other Supine Knee/Hip Exercises  supine clams RTB 2x10 each           PT Education - 12/25/17 1300    Education provided  Yes    Education Details  exercise technique    Person(s) Educated  Patient    Methods  Explanation;Demonstration    Comprehension  Verbalized understanding;Returned demonstration       PT Short Term Goals - 12/23/17 1350      PT SHORT TERM GOAL #1   Title  Patient will ambulated >250 feet  with rolling walker during the 3 MWT to improve her ability to ambulate further within her home before needing to rest.    Baseline  3/18: 89f with RW    Time  3    Period  Weeks    Status  On-going    Target Date  01/13/18      PT SHORT TERM GOAL #2   Title  Patient with tolerate the standing position with upper extremity support for 5 minutes before needing to rest to improve her ability to perform standing functional activities and ADLs within her home environment.    Baseline  3/18: able to stand 5 mins with no UE support    Time  3    Period  Weeks    Status  Achieved      PT SHORT TERM GOAL #3   Title  Patient will increase MMT grades by 1/2 grade to show improvement in her strength and stability to perform functional activities.    Baseline  3/18: see MMT    Time  3    Period  Weeks    Status  Partially Met      PT SHORT TERM GOAL #4   Title  Patient will be able to stand on either foot for 10 seconds to exhibit improved balance and decreased risk for falls.    Baseline  3/18: R: 7 sec with bil HHA, L: 10 sec with bil HHA    Time  3    Period  Weeks    Status  On-going        PT Long Term Goals - 12/23/17 1351      PT LONG TERM GOAL #1   Title  Patient will ambulated >500 feet with rolling walker during the 3 MWT to improve her ability to ambulate further  in community to allow to her go enjoy going out for a meal.    Baseline  3/18: 89fwith RA    Time  6    Period  Weeks    Status  On-going    Target Date  02/03/18      PT LONG TERM GOAL #2   Title  Patient with tolerate the standing position with upper extremity support for 10 minutes before needing to rest to improve her  ability to stand and have a conversation with a friend or relative.    Baseline  3/18: able to stand 5 mins with no UE support    Time  6    Period  Weeks    Status  On-going      PT LONG TERM GOAL #3   Title  Patient will increase MMT grades by 1 grade to show continued improvement in  her strength and stability to perform functional activities at home independently.    Baseline  3/18: see MMT    Time  6    Period  Weeks    Status  Partially Met      PT LONG TERM GOAL #4   Title  Patient will be able to stand on either foot for 15 seconds to exhibit improved balance and further decreased risk for falls.    Baseline  3/18: R: 7 sec bil HHA, L: 10 sec bil HHA    Time  6    Period  Weeks    Status  On-going      PT LONG TERM GOAL #5   Title  Pt will be able to perform 5xSTS in 12 sec or < with no UE in order to demo improved functional BLE strength and overall balance.    Time  6    Period  Weeks    Status  New      Additional Long Term Goals   Additional Long Term Goals  Yes      PT LONG TERM GOAL #6   Title  Pt will report requiring assistance for ADLs and IADLs at home 25% of the time or < to demonstrate improved overall functional strength, endurance, and balance in order to promote independence at home.    Time  6    Period  Weeks    Status  New    Target Date  02/03/18            Plan - 12/25/17 1343    Clinical Impression Statement  Pt presents to therapy with reports of compliance with HEP; states she could only stand for 4.5 mins straight this morning before having to sit due to muscle fatigue. Today's session focused on BLE and functional strengthening. Pt tolerated session well, only reporting muscle fatigue and required a couple rest breaks. Dtr provided O2 tank at beginning of session in case pt needed it but her O2 sats remained in mid-high 90s throughout entire session on RA. HR 117 after supine activities and once seated EOB and was 123 following sit to stands; slowly decreased with seated rest break following each. Generally increased difficulty with exercises throughout with the LLE. Provided pt with new handout with bridges on it as she was given this following evaluation in January but has forgotten to do them. Continue as planned, progressing  as able.    Rehab Potential  Good    PT Frequency  3x / week    PT Duration  6 weeks    PT Treatment/Interventions  ADLs/Self Care Home Management;Gait training;Stair training;Functional mobility training;Therapeutic activities;Patient/family education;Balance training;Neuromuscular re-education;Therapeutic exercise;Manual techniques;Energy conservation    PT Next Visit Plan  Allow adequate rest breaks for patient, monitor O2 and HR; perform mixed standing and supine strengthening exercises for hips/glutes. add HIIT on NuStep for BLE strengthening/endurance and cardiovascular endurance.    PT Home Exercise Plan  intial - lap walking at home; supine bridges; 3/18: supine clams with RTB    Consulted and Agree  with Plan of Care  Patient       Patient will benefit from skilled therapeutic intervention in order to improve the following deficits and impairments:  Abnormal gait, Pain, Decreased mobility, Decreased coordination, Decreased activity tolerance, Decreased endurance, Decreased strength, Decreased balance, Difficulty walking  Visit Diagnosis: Muscle weakness (generalized)  Pain in left lower leg  Other abnormalities of gait and mobility     Problem List Patient Active Problem List   Diagnosis Date Noted  . Anemia, chronic disease 12/13/2017  . Mild protein-calorie malnutrition ([place]) 12/13/2017  . Peripheral neuropathy due to chemotherapy ([place]) 12/13/2017  . S/P thoracentesis   . Atrial fibrillation with RVR ([place]) 11/08/2017  . HCAP (healthcare-associated pneumonia) 11/08/2017  . Pleural effusion on right 09/20/2017  . Acute on chronic diastolic heart failure ([place]) 08/19/2017  . Pancytopenia, acquired ([place]) 08/15/2017  . Protein-calorie malnutrition, moderate ([place]) 07/30/2017  . Generalized weakness 07/30/2017  . Antineoplastic chemotherapy induced pancytopenia ([place]) 07/20/2017  . Ovarian CA, right ([place]) 07/18/2017  . PNA (pneumonia) 07/07/2017  . Ascites 06/30/2017  .  Ascites, malignant 06/30/2017  . Right ovarian cyst 01/18/2017  . Elevated CA-125 01/18/2017  . Depression 12/23/2015  . Peripheral edema 08/23/2015  . Fracture of hip, left, closed ([place]) 12/08/2013  . Hip fracture ([place]) 12/08/2013  . PAD (peripheral artery disease) ([place]) 11/25/2013  . Encounter for therapeutic drug monitoring 11/09/2013  . Atrial fibrillation ([place]) 05/07/2013  . Cough 03/20/2013  . Total knee replacement status 10/07/2012  . Knee pain 10/07/2012  . Knee stiffness 10/07/2012  . Tachycardia 09/17/2012  . Chest pain 09/17/2012  . Difficulty in walking(719.7) 09/16/2012  . Muscle weakness (generalized) 09/16/2012  . Postop Acute blood loss anemia 08/08/2012  . Instability of prosthetic knee ([place]) 08/06/2012  . Bloating 02/14/2012  . Upper abdominal pain 02/14/2012  . Allergic rhinitis, seasonal 02/15/2011  . COLITIS 02/20/2010  . Diarrhea 01/02/2010  . ABDOMINAL PAIN -GENERALIZED 01/02/2010  . PERSONAL HX COLONIC POLYPS 01/02/2010  . HYPOTHYROIDISM 12/28/2009  . COPD (chronic obstructive pulmonary disease) ([place]) 12/28/2009  . ARTHRITIS 12/28/2009      Geraldine Solar PT, DPT  [place] [address] [place], [place], [address] Phone: [phone]   Fax:  [phone]  Name: [name] MRN: [id number] Date of Birth: [birth year]

## 2017-12-27 ENCOUNTER — Encounter (HOSPITAL_COMMUNITY): Payer: Self-pay | Admitting: Occupational Therapy

## 2017-12-27 ENCOUNTER — Ambulatory Visit (HOSPITAL_COMMUNITY): Payer: Medicare Other

## 2017-12-27 ENCOUNTER — Other Ambulatory Visit: Payer: Self-pay | Admitting: *Deleted

## 2017-12-27 ENCOUNTER — Encounter (HOSPITAL_COMMUNITY): Payer: Self-pay

## 2017-12-27 DIAGNOSIS — M79662 Pain in left lower leg: Secondary | ICD-10-CM

## 2017-12-27 DIAGNOSIS — R2689 Other abnormalities of gait and mobility: Secondary | ICD-10-CM

## 2017-12-27 DIAGNOSIS — M6281 Muscle weakness (generalized): Secondary | ICD-10-CM

## 2017-12-27 NOTE — Therapy (Signed)
[place], [place], [address] Phone: [phone]   Fax:  [phone]  Physical Therapy Treatment  Patient Details  Name: [name] MRN: [id number] Date of Birth: 31-Oct-1932 Referring Provider: Dr. Heath Lark   Encounter Date: 12/27/2017  PT End of Session - 12/27/17 1117    Visit Number  3    Number of Visits  18    Date for PT Re-Evaluation  02/03/18 mini reassess on 01/13/18    Authorization Type  Medicare    Authorization Time Period  12/23/17 to 02/03/18    PT Start Time  1115    PT Stop Time  1200    PT Time Calculation (min)  45 min    Equipment Utilized During Treatment  Oxygen    Activity Tolerance  Patient tolerated treatment well;Patient limited by fatigue    Behavior During Therapy  [facility] for tasks assessed/performed       Past Medical History:  Diagnosis Date  . COPD (chronic obstructive pulmonary disease) ([place])   . Depression   . Diverticulosis   . Family hx of colon cancer   . Fibromyalgia   . GERD (gastroesophageal reflux disease)   . Hemorrhoids   . Hiatal hernia   . History of shingles   . Hypothyroidism   . IBS (irritable bowel syndrome)   . Lymphocytic colitis   . Osteoporosis   . Ovarian cancer ([place])   . Paroxysmal atrial fibrillation ([facility])   . Psoriatic arthritis ([place])   . Rectal polyp   . Schatzki's ring   . Uterine polyp     Past Surgical History:  Procedure Laterality Date  . CHOLECYSTECTOMY  Dec 04, 2010  . COLONOSCOPY    . FEMUR IM NAIL Left 12/11/2013   Procedure: INTRAMEDULLARY (IM) NAIL FEMORAL;  Surgeon: [name], MD;  Location: [facility];  Service: Orthopedics;  Laterality: Left;  . FRACTURE SURGERY Left 12/11/2013   hip  . IR FLUORO GUIDE PORT INSERTION RIGHT  07/22/2017  . IR PARACENTESIS  07/12/2017  . IR US GUIDE VASC ACCESS RIGHT  07/22/2017  . KNEE SURGERY Left    x 2  . TOTAL KNEE REVISION  08/06/2012   Procedure: TOTAL KNEE REVISION;  Surgeon:  [name], MD;  Location: [facility];  Service: Orthopedics;  Laterality: Left;  Left Total Knee Arthroplasty Revision  . Uterine polypectomy      There were no vitals filed for this visit.  Subjective Assessment - 12/27/17 1117    Subjective  Pt reports that she was very sore following last session. She said that her legs were burning and very sore. She said that her buttocks are sore.    Limitations  Walking;Standing    How long can you sit comfortably?  not limited    How long can you stand comfortably?  a few mins    How long can you walk comfortably?  225f or > at the nursing home    Patient Stated Goals  Would like to come to clinic to build up her strength and endurance    Currently in Pain?  Yes    Pain Score  4     Pain Location  Buttocks    Pain Orientation  Right;Left    Pain Descriptors / Indicators  Burning    Pain Type  Acute pain    Pain Onset  In the past 7 days    Pain Frequency  Intermittent  [place] [facility] PT Treatment/Exercise - 12/27/17 0001      Knee/Hip Exercises: Stretches   Passive Hamstring Stretch  Both;2 reps;30 seconds;Limitations    Passive Hamstring Stretch Limitations  supine with rope    Piriformis Stretch  Both;2 reps;30 seconds;Limitations    Piriformis Stretch Limitations  supine    Other Knee/Hip Stretches  bil SKTC 2x30" each      Knee/Hip Exercises: Standing   Gait Training  x2 laps around gym with RW (1 seated rest break after 1st lap; LLE weakness noted during gait)      Knee/Hip Exercises: Seated   Long Arc Quad  Both;10 reps    Long [employer] Limitations  2-3" holds    Marching  Both;2 sets;10 reps      Knee/Hip Exercises: Supine   Short Arc Quad Sets  Both;2 sets;10 reps;Limitations    Short Arc [employer] Limitations  3" holds    Bridges  Both;10 reps    Bridges with [employer]  Both;10 reps          PT Short Term Goals - 12/23/17 1350      PT SHORT TERM GOAL #1   Title  Patient will ambulated >250 feet with  rolling walker during the 3 MWT to improve her ability to ambulate further within her home before needing to rest.    Baseline  3/18: 25f with RW    Time  3    Period  Weeks    Status  On-going    Target Date  01/13/18      PT SHORT TERM GOAL #2   Title  Patient with tolerate the standing position with upper extremity support for 5 minutes before needing to rest to improve her ability to perform standing functional activities and ADLs within her home environment.    Baseline  3/18: able to stand 5 mins with no UE support    Time  3    Period  Weeks    Status  Achieved      PT SHORT TERM GOAL #3   Title  Patient will increase MMT grades by 1/2 grade to show improvement in her strength and stability to perform functional activities.    Baseline  3/18: see MMT    Time  3    Period  Weeks    Status  Partially Met      PT SHORT TERM GOAL #4   Title  Patient will be able to stand on either foot for 10 seconds to exhibit improved balance and decreased risk for falls.    Baseline  3/18: R: 7 sec with bil HHA, L: 10 sec with bil HHA    Time  3    Period  Weeks    Status  On-going        PT Long Term Goals - 12/23/17 1351      PT LONG TERM GOAL #1   Title  Patient will ambulated >500 feet with rolling walker during the 3 MWT to improve her ability to ambulate further  in community to allow to her go enjoy going out for a meal.    Baseline  3/18: 24fwith RA    Time  6    Period  Weeks    Status  On-going    Target Date  02/03/18      PT LONG TERM GOAL #2   Title  Patient with tolerate the standing position with upper extremity support for 10 minutes before needing  to rest to improve her ability to stand and have a conversation with a friend or relative.    Baseline  3/18: able to stand 5 mins with no UE support    Time  6    Period  Weeks    Status  On-going      PT LONG TERM GOAL #3   Title  Patient will increase MMT grades by 1 grade to show continued improvement in her  strength and stability to perform functional activities at home independently.    Baseline  3/18: see MMT    Time  6    Period  Weeks    Status  Partially Met      PT LONG TERM GOAL #4   Title  Patient will be able to stand on either foot for 15 seconds to exhibit improved balance and further decreased risk for falls.    Baseline  3/18: R: 7 sec bil HHA, L: 10 sec bil HHA    Time  6    Period  Weeks    Status  On-going      PT LONG TERM GOAL #5   Title  Pt will be able to perform 5xSTS in 12 sec or < with no UE in order to demo improved functional BLE strength and overall balance.    Time  6    Period  Weeks    Status  New      Additional Long Term Goals   Additional Long Term Goals  Yes      PT LONG TERM GOAL #6   Title  Pt will report requiring assistance for ADLs and IADLs at home 25% of the time or < to demonstrate improved overall functional strength, endurance, and balance in order to promote independence at home.    Time  6    Period  Weeks    Status  New    Target Date  02/03/18            Plan - 12/27/17 1159    Clinical Impression Statement  Pt reporting increased BLE soreness following last session, stating that she had to take pain pills to help. Due to increased soreness, did not progress pt's POC this date and added LE stretching at beginning of session to facilitate decreased pain. After stretching, rest of session focused on BLE strengthening. Ended session with 2 laps around gym to promote improved BLE endurance and cardiovascular endurance; pt required 3 mins seated rest break after each lap due to fatigue. Continue as planned, progressing slowly as able, ensuring not to over-work pt in order to decrease severe DOMS so as to maximize pt's overall strength and progression.    Rehab Potential  Good    PT Frequency  3x / week    PT Duration  6 weeks    PT Treatment/Interventions  ADLs/Self Care Home Management;Gait training;Stair training;Functional mobility  training;Therapeutic activities;Patient/family education;Balance training;Neuromuscular re-education;Therapeutic exercise;Manual techniques;Energy conservation    PT Next Visit Plan  Allow adequate rest breaks for patient, monitor O2 and HR; perform mixed standing and supine strengthening exercises for hips/glutes. add HIIT on NuStep for BLE strengthening/endurance and cardiovascular endurance.    PT Home Exercise Plan  intial - lap walking at home; supine bridges; 3/18: supine clams with RTB    Consulted and Agree with Plan of Care  Patient       Patient will benefit from skilled therapeutic intervention in order to improve the following deficits and impairments:  Abnormal  gait, Pain, Decreased mobility, Decreased coordination, Decreased activity tolerance, Decreased endurance, Decreased strength, Decreased balance, Difficulty walking  Visit Diagnosis: Muscle weakness (generalized)  Pain in left lower leg  Other abnormalities of gait and mobility     Problem List Patient Active Problem List   Diagnosis Date Noted  . Anemia, chronic disease 12/13/2017  . Mild protein-calorie malnutrition ([place]) 12/13/2017  . Peripheral neuropathy due to chemotherapy ([place]) 12/13/2017  . S/P thoracentesis   . Atrial fibrillation with RVR ([place]) 11/08/2017  . HCAP (healthcare-associated pneumonia) 11/08/2017  . Pleural effusion on right 09/20/2017  . Acute on chronic diastolic heart failure ([place]) 08/19/2017  . Pancytopenia, acquired ([place]) 08/15/2017  . Protein-calorie malnutrition, moderate ([place]) 07/30/2017  . Generalized weakness 07/30/2017  . Antineoplastic chemotherapy induced pancytopenia ([place]) 07/20/2017  . Ovarian CA, right ([place]) 07/18/2017  . PNA (pneumonia) 07/07/2017  . Ascites 06/30/2017  . Ascites, malignant 06/30/2017  . Right ovarian cyst 01/18/2017  . Elevated CA-125 01/18/2017  . Depression 12/23/2015  . Peripheral edema 08/23/2015  . Fracture of hip, left, closed ([place]) 12/08/2013   . Hip fracture ([place]) 12/08/2013  . PAD (peripheral artery disease) ([place]) 11/25/2013  . Encounter for therapeutic drug monitoring 11/09/2013  . Atrial fibrillation ([place]) 05/07/2013  . Cough 03/20/2013  . Total knee replacement status 10/07/2012  . Knee pain 10/07/2012  . Knee stiffness 10/07/2012  . Tachycardia 09/17/2012  . Chest pain 09/17/2012  . Difficulty in walking(719.7) 09/16/2012  . Muscle weakness (generalized) 09/16/2012  . Postop Acute blood loss anemia 08/08/2012  . Instability of prosthetic knee ([place]) 08/06/2012  . Bloating 02/14/2012  . Upper abdominal pain 02/14/2012  . Allergic rhinitis, seasonal 02/15/2011  . COLITIS 02/20/2010  . Diarrhea 01/02/2010  . ABDOMINAL PAIN -GENERALIZED 01/02/2010  . PERSONAL HX COLONIC POLYPS 01/02/2010  . HYPOTHYROIDISM 12/28/2009  . COPD (chronic obstructive pulmonary disease) ([place]) 12/28/2009  . ARTHRITIS 12/28/2009      Geraldine Solar PT, DPT  [place] [address] [place], [place], [address] Phone: [phone]   Fax:  [phone]  Name: [name] MRN: [id number] Date of Birth: 18-Sep-1933

## 2017-12-27 NOTE — Patient Outreach (Signed)
[facility]) Care Management   12/27/2017  [name] 01/27/1933 [id number]  [name] is an 84 y.o. female  Subjective: see notes below  Objective:   BP 102/62   Pulse (!) 55   Temp 98.4 F (36.9 C) (Oral)   Resp 20   SpO2 98%  Review of Systems  Constitutional: Negative for fever.  HENT: Negative.   Eyes: Negative.   Respiratory: Positive for wheezing. Negative for cough, hemoptysis, sputum production and shortness of breath.   Cardiovascular: Negative.   Gastrointestinal: Negative for constipation and diarrhea.       Hx of colitis   Genitourinary: Negative.   Musculoskeletal: Negative.   Skin: Negative.   Neurological: Negative.   Endo/Heme/Allergies: Bruises/bleeds easily.  Psychiatric/Behavioral: Negative.     Physical Exam  Constitutional: She is oriented to person, place, and time. She appears well-developed and well-nourished.  HENT:  Head: Normocephalic and atraumatic.  Eyes: Pupils are equal, round, and reactive to light. Conjunctivae are normal.  Neck: Normal range of motion. Neck supple.  Cardiovascular: Normal rate and regular rhythm.  Respiratory: Effort normal and breath sounds normal.  GI: Soft. Bowel sounds are normal.  Musculoskeletal: Normal range of motion.  Neurological: She is alert and oriented to person, place, and time.  Skin: Skin is warm and dry.  Psychiatric: She has a normal mood and affect. Her behavior is normal. Judgment and thought content normal.    Encounter Medications:   Outpatient Encounter Medications as of 12/27/2017  Medication Sig Note  . albuterol (PROAIR HFA) 108 (90 Base) MCG/ACT inhaler Inhale 2 puffs into the lungs every 6 (six) hours as needed for wheezing or shortness of breath.   . ALPRAZolam (XANAX) 0.25 MG tablet Take 1 tablet (0.25 mg total) by mouth at bedtime as needed for anxiety. 12/17/2017: End date: 12/24/17  . azelastine (ASTELIN) 0.1 % nasal spray Place 2 sprays into both  nostrils 2 (two) times daily as needed for allergies.  12/17/2017: Start date: 11/20/2017  . budesonide-formoterol (SYMBICORT) 160-4.5 MCG/ACT inhaler Inhale 2 puffs into the lungs 2 (two) times daily. (Patient not taking: Reported on 12/17/2017)   . Cholecalciferol (VITAMIN D) 2000 units CAPS Take 2,000 Units by mouth daily.   Marland Kitchen diltiazem (CARTIA XT) 120 MG 24 hr capsule TAKE 1 CAPSULE BY MOUTH EVERY DAY (Patient not taking: Reported on 12/17/2017)   . diltiazem (DILACOR XR) 180 MG 24 hr capsule Take 180 mg by mouth daily.   . diphenhydrAMINE (BENADRYL) 25 mg capsule Take 25 mg by mouth every 6 (six) hours as needed for itching. 12/17/2017: Start date:12/17/2017  . diphenoxylate-atropine (LOMOTIL) 2.5-0.025 MG tablet Take 1 tablet by mouth 4 (four) times daily. (Patient taking differently: Take 1 tablet by mouth 4 (four) times daily as needed for diarrhea or loose stools. )   . DULoxetine (CYMBALTA) 30 MG capsule Take 1 capsule (30 mg total) by mouth daily.   . fluticasone (FLONASE) 50 MCG/ACT nasal spray Place 2 sprays into both nostrils 2 (two) times daily. For nasal congestion   . furosemide (LASIX) 20 MG tablet Take 1 tablet (20 mg total) by mouth daily.   Marland Kitchen HYDROcodone-acetaminophen (NORCO/VICODIN) 5-325 MG tablet Take 2 tablets by mouth every 4 (four) hours as needed for moderate pain or severe pain. 12/17/2017: Start date:12/03/2017  . ipratropium-albuterol (DUONEB) 0.5-2.5 (3) MG/3ML SOLN Take 3 mLs by nebulization 3 (three) times daily. (Patient taking differently: Take 3 mLs by nebulization every 6 (six) hours as needed (  cough or SOB). As needed) 12/17/2017: Start date:12/02/17  . levothyroxine (SYNTHROID, LEVOTHROID) 137 MCG tablet Take 137 mcg by mouth every morning. For thyroid therapy   . lidocaine-prilocaine (EMLA) cream Apply 1 application topically as needed. 12/17/2017: Start date: 11/20/17  . lidocaine-prilocaine (EMLA) cream Apply to affected area once   . loperamide (IMODIUM) 2 MG capsule  Take 2 capsules (4 mg total) by mouth 3 (three) times daily.   . magic mouthwash w/lidocaine SOLN Take 5 mLs by mouth 4 (four) times daily. 5 ml QID swish and spit (Patient not taking: Reported on 12/17/2017)   . mirtazapine (REMERON) 15 MG tablet Take 1 tablet (15 mg total) by mouth at bedtime.   Marland Kitchen nystatin (MYCOSTATIN) 100000 UNIT/ML suspension Use as directed 5 mLs in the mouth or throat 4 (four) times daily. Swish and spit   . ondansetron (ZOFRAN) 8 MG tablet Take 1 tablet (8 mg total) by mouth every 8 (eight) hours as needed for nausea or vomiting.   . ondansetron (ZOFRAN) 8 MG tablet Take 1 tablet (8 mg total) by mouth 2 (two) times daily as needed for refractory nausea / vomiting. Start on day 3 after chemo. (Patient not taking: Reported on 12/17/2017)   . Oxycodone HCl 10 MG TABS Take 1 tablet (10 mg total) by mouth every 4 (four) hours as needed. (Patient not taking: Reported on 12/17/2017)   . polyethylene glycol (MIRALAX / GLYCOLAX) packet Take 17 g by mouth daily.   . polyvinyl alcohol (LIQUIFILM TEARS) 1.4 % ophthalmic solution 1 drop 2 (two) times daily as needed for dry eyes. 12/17/2017: Start date: 11/15/2017  . potassium chloride (K-DUR) 10 MEQ tablet Take 10 mEq by mouth daily.   . prochlorperazine (COMPAZINE) 10 MG tablet Take 1 tablet (10 mg total) by mouth every 6 (six) hours as needed (Nausea or vomiting). (Patient not taking: Reported on 12/17/2017)   . senna-docusate (SENOKOT-S) 8.6-50 MG tablet Take 1 tablet by mouth at bedtime.   . Tiotropium Bromide-Olodaterol (STIOLTO RESPIMAT IN) Inhale 2 puffs into the lungs daily.   Alveda Reasons 20 MG TABS tablet TAKE 1 TABLET BY MOUTH EVERY DAY WITH SUPPER   . [DISCONTINUED] Calcium Carbonate (CALCIUM 600) 1500 MG TABS Take 2 tablets by mouth daily.      No facility-administered encounter medications on file as of 12/27/2017.     Functional Status:   In your present state of health, do you have any difficulty performing the following  activities: 12/18/2017 11/08/2017  Hearing? Tempie Donning  Vision? Y N  Difficulty concentrating or making decisions? Y N  Walking or climbing stairs? Y Y  Dressing or bathing? Y Y  Doing errands, shopping? Y Y  Some recent data might be hidden    Fall/Depression Screening:    Fall Risk  12/18/2017  Falls in the past year? No  Risk for fall due to : History of fall(s);Impaired balance/gait   PHQ 2/9 Scores 12/18/2017  PHQ - 2 Score 4  PHQ- 9 Score 11    Assessment:   CM met with Mrs Rowe and her daughter today at her home Mrs Childers is off oxygen today and doing well She reports she uses oxygen more at night  Her Daughter works at the hospital and coordinates her medical care.  She has private paid staff with her when her daughter is not present This staff is present during the home visit today Medical care, education and coordination of care services denied after CM reviewed [facility] transition  of care services to Mrs Niziolek and her daughter   Plan:  Case closure per Mrs Boorman and her daughter request Letters to be sent to patient and MD   Joelene Millin L. Lavina Hamman, RN, BSN, [place] Coordinator [phone] week day mobile

## 2017-12-28 IMAGING — US US PARACENTESIS
1 series · 5 of 5 positions shown · non-contrast
Comparison: none

INDICATION: Ovarian cancer with recurrent malignant ascites; request made for
therapeutic paracentesis.

[Series 1: us paracentesis · 0.26mm/px · 5 of 5 slices shown]
[im 1/5]
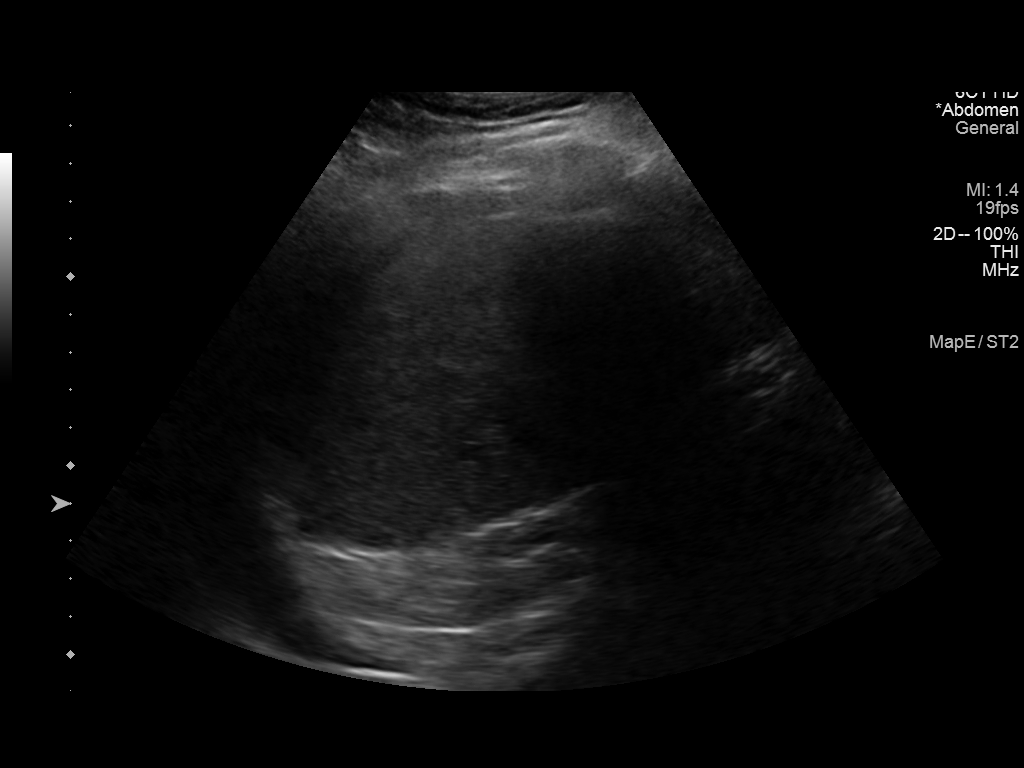
[im 2/5]
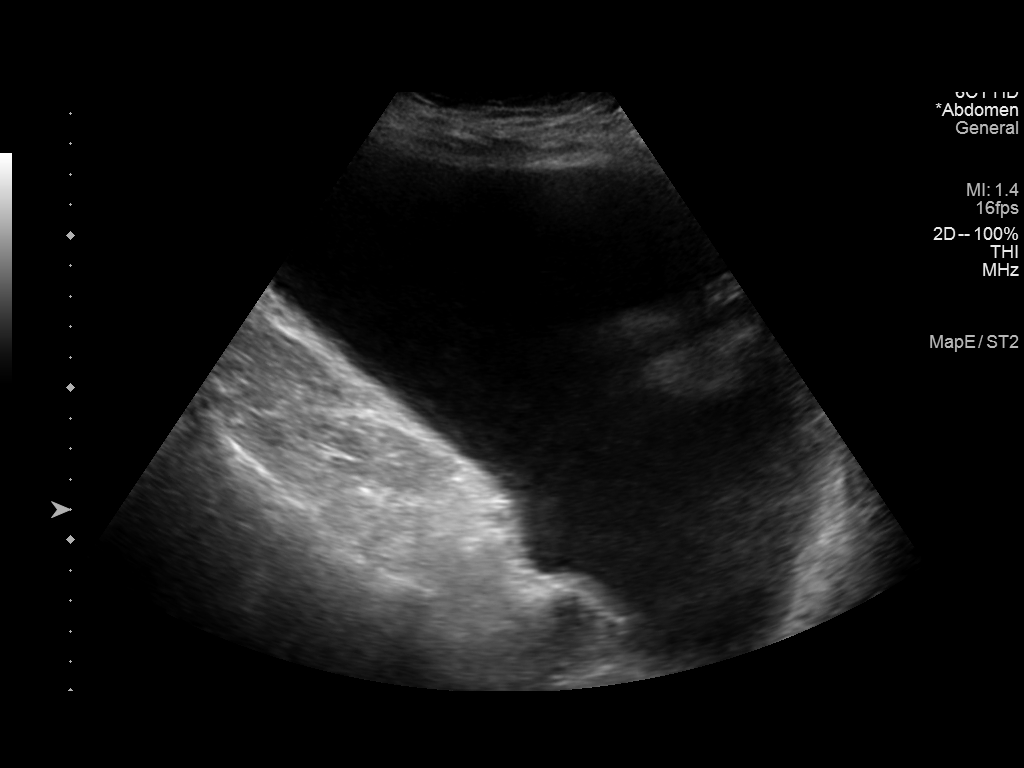
[im 3/5]
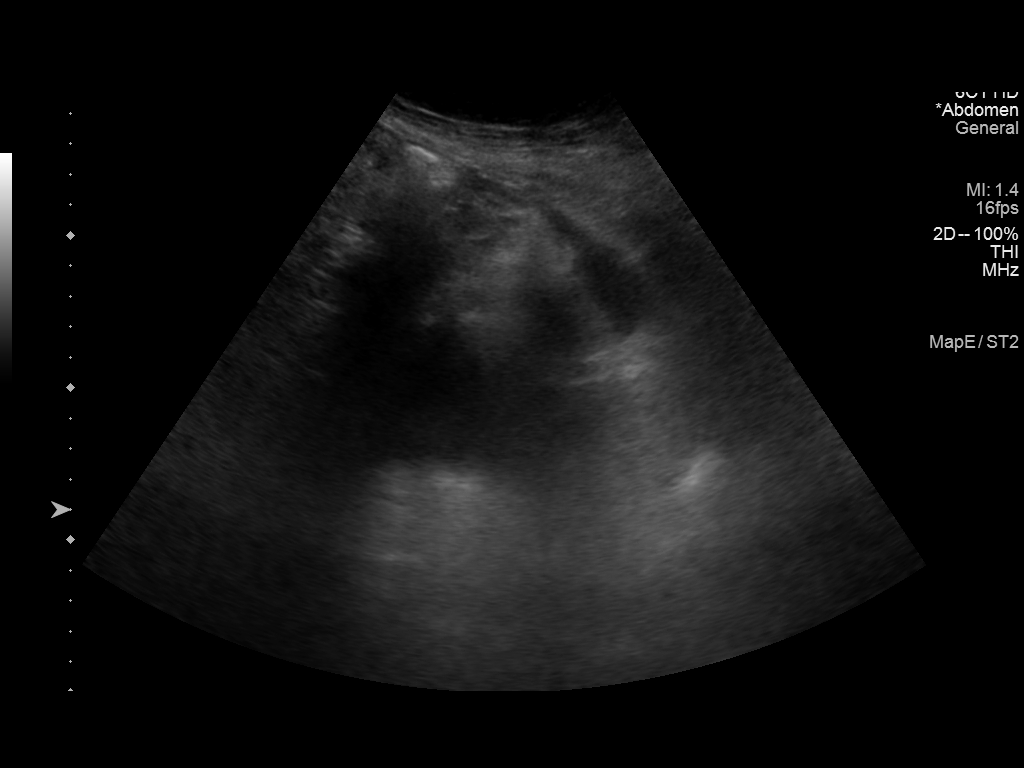
[im 4/5]
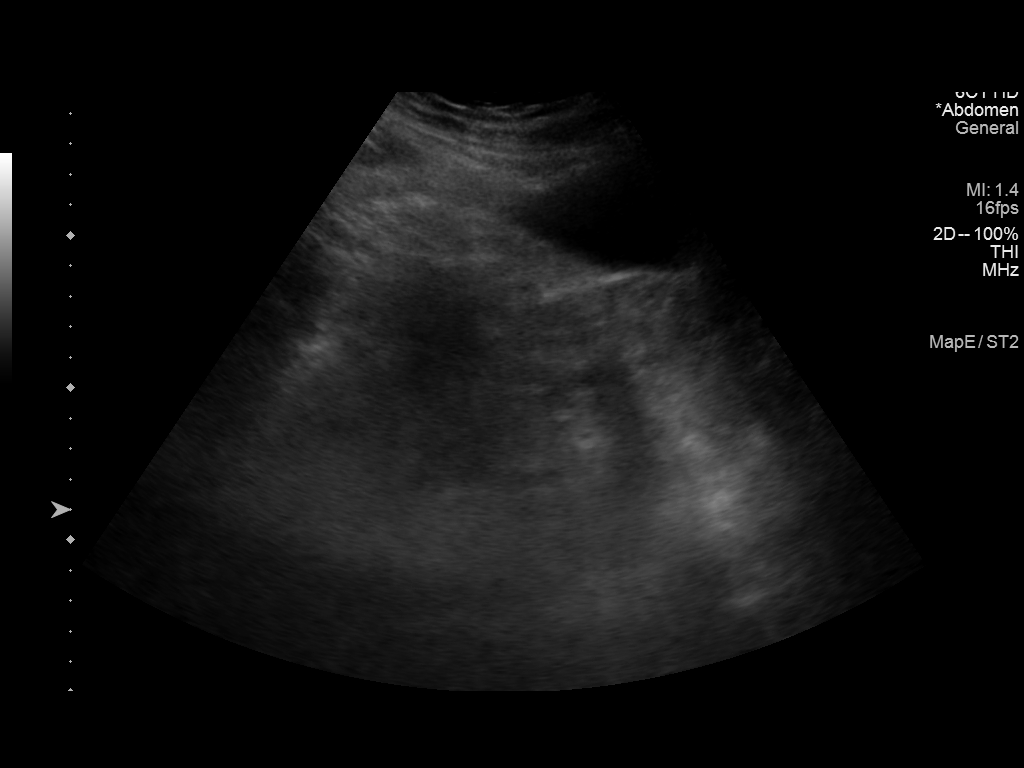
[im 5/5]
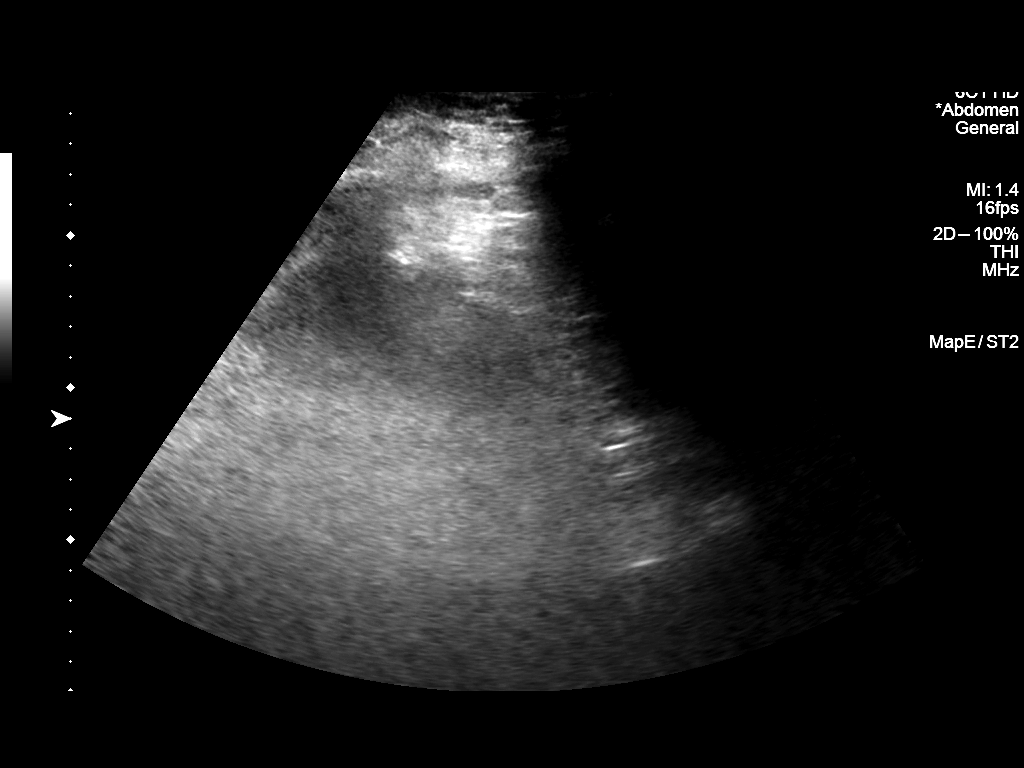

[5 of 5 positions shown; findings below may reference images not displayed]

EXAM:
ULTRASOUND GUIDED THERAPEUTIC PARACENTESIS

MEDICATIONS:
None.

COMPLICATIONS:
None immediate.

PROCEDURE:
Informed written consent was obtained from the patient after a
discussion of the risks, benefits and alternatives to treatment. A
timeout was performed prior to the initiation of the procedure.

Initial ultrasound scanning demonstrates a small to moderate amount
of ascites within the right lower abdominal quadrant. The right
lower abdomen was prepped and draped in the usual sterile fashion.
2% lidocaine was used for local anesthesia.

Following this, a Safe-T-Centesis catheter was introduced. An
ultrasound image was saved for documentation purposes. The
paracentesis was performed. The catheter was removed and a dressing
was applied. The patient tolerated the procedure well without
immediate post procedural complication.
FINDINGS: A total of approximately 2.6 liters of chylous/milky colored fluid
was removed.
IMPRESSION: Successful ultrasound-guided therapeutic paracentesis yielding
liters of peritoneal fluid.

## 2017-12-30 ENCOUNTER — Ambulatory Visit: Payer: Self-pay | Admitting: Gynecology

## 2017-12-30 ENCOUNTER — Ambulatory Visit (HOSPITAL_COMMUNITY): Payer: Medicare Other

## 2017-12-30 ENCOUNTER — Encounter (HOSPITAL_COMMUNITY): Payer: Self-pay

## 2017-12-30 DIAGNOSIS — M6281 Muscle weakness (generalized): Secondary | ICD-10-CM | POA: Diagnosis not present

## 2017-12-30 DIAGNOSIS — M79662 Pain in left lower leg: Secondary | ICD-10-CM

## 2017-12-30 DIAGNOSIS — R2689 Other abnormalities of gait and mobility: Secondary | ICD-10-CM

## 2017-12-30 NOTE — Patient Instructions (Signed)
  Short Arc Quads (SAQs)  While lying face up, place two rolled towels under the knee to lift it about 5 inches off the mat. Bring your toes towards your head (dorsiflexion)then straighten the knee for a hold of 3-5 seconds. Slowly lower your leg back to the mat.   Perform with other exercises, 2-3 sets of 10-15 reps on each leg

## 2017-12-30 NOTE — Therapy (Signed)
[facility] [place], [place], [address] Phone: [phone]   Fax:  [phone]  Physical Therapy Treatment  Patient Details  Name: [name] MRN: [id number] Date of Birth: 06-05-1933 Referring Provider: Dr. Heath Lark   Encounter Date: 12/30/2017  PT End of Session - 12/30/17 1350    Visit Number  4    Number of Visits  18    Date for PT Re-Evaluation  02/03/18 mini reassess on 01/13/18    Authorization Type  Medicare    Authorization Time Period  12/23/17 to 02/03/18    PT Start Time  1345    PT Stop Time  1425    PT Time Calculation (min)  40 min    Equipment Utilized During Treatment  Oxygen    Activity Tolerance  Patient tolerated treatment well;Patient limited by fatigue    Behavior During Therapy  [facility] for tasks assessed/performed       Past Medical History:  Diagnosis Date  . COPD (chronic obstructive pulmonary disease) ([place])   . Depression   . Diverticulosis   . Family hx of colon cancer   . Fibromyalgia   . GERD (gastroesophageal reflux disease)   . Hemorrhoids   . Hiatal hernia   . History of shingles   . Hypothyroidism   . IBS (irritable bowel syndrome)   . Lymphocytic colitis   . Osteoporosis   . Ovarian cancer ([place])   . Paroxysmal atrial fibrillation ([facility])   . Psoriatic arthritis ([place])   . Rectal polyp   . Schatzki's ring   . Uterine polyp     Past Surgical History:  Procedure Laterality Date  . CHOLECYSTECTOMY  Dec 04, 2010  . COLONOSCOPY    . FEMUR IM NAIL Left 12/11/2013   Procedure: INTRAMEDULLARY (IM) NAIL FEMORAL;  Surgeon: [name], MD;  Location: [facility];  Service: Orthopedics;  Laterality: Left;  . FRACTURE SURGERY Left 12/11/2013   hip  . IR FLUORO GUIDE PORT INSERTION RIGHT  07/22/2017  . IR PARACENTESIS  07/12/2017  . IR US GUIDE VASC ACCESS RIGHT  07/22/2017  . KNEE SURGERY Left    x 2  . TOTAL KNEE REVISION  08/06/2012   Procedure: TOTAL KNEE REVISION;  Surgeon:  [name], MD;  [facility];  Service: Orthopedics;  Laterality: Left;  Left Total Knee Arthroplasty Revision  . Uterine polypectomy      There were no vitals filed for this visit.  Subjective Assessment - 12/30/17 1349    Subjective  Pt reports that her legs were sore following last session but not nearly as bad as the other time.     Limitations  Walking;Standing    How long can you sit comfortably?  not limited    How long can you stand comfortably?  a few mins    How long can you walk comfortably?  89f or > at the nursing home    Patient Stated Goals  Would like to come to clinic to build up her strength and endurance    Currently in Pain?  Yes    Pain Score  4     Pain Location  Buttocks    Pain Orientation  Right;Left    Pain Descriptors / Indicators  Burning    Pain Type  Acute pain    Pain Onset  In the past 7 days    Pain Frequency  Intermittent    Aggravating Factors   nothing  Pain Relieving Factors  activity    Effect of Pain on Daily Activities  decreased ability to complete ADLs            [facility] Adult PT Treatment/Exercise - 12/30/17 0001      Knee/Hip Exercises: Standing   Heel Raises  Both;10 reps    Heel Raises Limitations  heel and toe; BUE support    Knee Flexion  Both;2 sets;10 reps    Hip Abduction  Both;2 sets;10 reps    Abduction Limitations  BUE support    Gait Training  x2 laps around gym with RW (1 seated rest break after 1st lap; LLE weakness noted during gait)      Knee/Hip Exercises: Supine   Short Arc Quad Sets  Both;2 sets;10 reps;Limitations    Short Arc Quad Sets Limitations  3" holds    Bridges with [employer]  Both;2 sets;10 reps    Straight Leg Raises  Both;2 sets;10 reps      Knee/Hip Exercises: Sidelying   Clams  BLE 2x10 reps each            PT Education - 12/30/17 1427    Education provided  Yes    Education Details  exercise technique, updated HEP    Person(s) Educated  Patient    Methods   Explanation;Demonstration    Comprehension  Verbalized understanding;Returned demonstration       PT Short Term Goals - 12/23/17 1350      PT SHORT TERM GOAL #1   Title  Patient will ambulated >250 feet with rolling walker during the 3 MWT to improve her ability to ambulate further within her home before needing to rest.    Baseline  3/18: 89f with RW    Time  3    Period  Weeks    Status  On-going    Target Date  01/13/18      PT SHORT TERM GOAL #2   Title  Patient with tolerate the standing position with upper extremity support for 5 minutes before needing to rest to improve her ability to perform standing functional activities and ADLs within her home environment.    Baseline  3/18: able to stand 5 mins with no UE support    Time  3    Period  Weeks    Status  Achieved      PT SHORT TERM GOAL #3   Title  Patient will increase MMT grades by 1/2 grade to show improvement in her strength and stability to perform functional activities.    Baseline  3/18: see MMT    Time  3    Period  Weeks    Status  Partially Met      PT SHORT TERM GOAL #4   Title  Patient will be able to stand on either foot for 10 seconds to exhibit improved balance and decreased risk for falls.    Baseline  3/18: R: 7 sec with bil HHA, L: 10 sec with bil HHA    Time  3    Period  Weeks    Status  On-going        PT Long Term Goals - 12/23/17 1351      PT LONG TERM GOAL #1   Title  Patient will ambulated >500 feet with rolling walker during the 3 MWT to improve her ability to ambulate further  in community to allow to her go enjoy going out for a meal.    Baseline  3/18: 52f with RA    Time  6    Period  Weeks    Status  On-going    Target Date  02/03/18      PT LONG TERM GOAL #2   Title  Patient with tolerate the standing position with upper extremity support for 10 minutes before needing to rest to improve her ability to stand and have a conversation with a friend or relative.    Baseline   3/18: able to stand 5 mins with no UE support    Time  6    Period  Weeks    Status  On-going      PT LONG TERM GOAL #3   Title  Patient will increase MMT grades by 1 grade to show continued improvement in her strength and stability to perform functional activities at home independently.    Baseline  3/18: see MMT    Time  6    Period  Weeks    Status  Partially Met      PT LONG TERM GOAL #4   Title  Patient will be able to stand on either foot for 15 seconds to exhibit improved balance and further decreased risk for falls.    Baseline  3/18: R: 7 sec bil HHA, L: 10 sec bil HHA    Time  6    Period  Weeks    Status  On-going      PT LONG TERM GOAL #5   Title  Pt will be able to perform 5xSTS in 12 sec or < with no UE in order to demo improved functional BLE strength and overall balance.    Time  6    Period  Weeks    Status  New      Additional Long Term Goals   Additional Long Term Goals  Yes      PT LONG TERM GOAL #6   Title  Pt will report requiring assistance for ADLs and IADLs at home 25% of the time or < to demonstrate improved overall functional strength, endurance, and balance in order to promote independence at home.    Time  6    Period  Weeks    Status  New    Target Date  02/03/18            Plan - 12/30/17 1427    Clinical Impression Statement  Pt presents to therapy with improved reports of DOMs following last session. She states that her legs were still sore but not nearly as bad as the time before. Continued with established POC, focusing on functional BLE strength and endurance. Continued with ambulation to promote improved BLE and cardiovascular endurance; she still required seated rest break after 1 lap around gym due to fatigue. Added light strengthening in WB this date (no resistance). Pt reported muscle fatigue at EOS but no pain. Again, educated pt that she should feel some muscle soreness as this is normal, but it shouldn't be extremely painful and  she verbalized understanding. Continue as planned, progressing slowly.    Rehab Potential  Good    PT Frequency  3x / week    PT Duration  6 weeks    PT Treatment/Interventions  ADLs/Self Care Home Management;Gait training;Stair training;Functional mobility training;Therapeutic activities;Patient/family education;Balance training;Neuromuscular re-education;Therapeutic exercise;Manual techniques;Energy conservation    PT Next Visit Plan  Allow adequate rest breaks for patient, monitor O2 and HR; perform mixed standing and supine strengthening exercises for hips/glutes. add HIIT on  NuStep for BLE strengthening/endurance and cardiovascular endurance.    PT Home Exercise Plan  intial - lap walking at home; supine bridges; 3/18: supine clams with RTB; 3/25: SAQ    Consulted and Agree with Plan of Care  Patient       Patient will benefit from skilled therapeutic intervention in order to improve the following deficits and impairments:  Abnormal gait, Pain, Decreased mobility, Decreased coordination, Decreased activity tolerance, Decreased endurance, Decreased strength, Decreased balance, Difficulty walking  Visit Diagnosis: Muscle weakness (generalized)  Pain in left lower leg  Other abnormalities of gait and mobility     Problem List Patient Active Problem List   Diagnosis Date Noted  . Anemia, chronic disease 12/13/2017  . Mild protein-calorie malnutrition ([place]) 12/13/2017  . Peripheral neuropathy due to chemotherapy ([place]) 12/13/2017  . S/P thoracentesis   . Atrial fibrillation with RVR ([place]) 11/08/2017  . HCAP (healthcare-associated pneumonia) 11/08/2017  . Pleural effusion on right 09/20/2017  . Acute on chronic diastolic heart failure ([place]) 08/19/2017  . Pancytopenia, acquired ([place]) 08/15/2017  . Protein-calorie malnutrition, moderate ([place]) 07/30/2017  . Generalized weakness 07/30/2017  . Antineoplastic chemotherapy induced pancytopenia ([place]) 07/20/2017  . Ovarian CA, right ([place])  07/18/2017  . PNA (pneumonia) 07/07/2017  . Ascites 06/30/2017  . Ascites, malignant 06/30/2017  . Right ovarian cyst 01/18/2017  . Elevated CA-125 01/18/2017  . Depression 12/23/2015  . Peripheral edema 08/23/2015  . Fracture of hip, left, closed ([place]) 12/08/2013  . Hip fracture ([place]) 12/08/2013  . PAD (peripheral artery disease) ([place]) 11/25/2013  . Encounter for therapeutic drug monitoring 11/09/2013  . Atrial fibrillation ([place]) 05/07/2013  . Cough 03/20/2013  . Total knee replacement status 10/07/2012  . Knee pain 10/07/2012  . Knee stiffness 10/07/2012  . Tachycardia 09/17/2012  . Chest pain 09/17/2012  . Difficulty in walking(719.7) 09/16/2012  . Muscle weakness (generalized) 09/16/2012  . Postop Acute blood loss anemia 08/08/2012  . Instability of prosthetic knee ([place]) 08/06/2012  . Bloating 02/14/2012  . Upper abdominal pain 02/14/2012  . Allergic rhinitis, seasonal 02/15/2011  . COLITIS 02/20/2010  . Diarrhea 01/02/2010  . ABDOMINAL PAIN -GENERALIZED 01/02/2010  . PERSONAL HX COLONIC POLYPS 01/02/2010  . HYPOTHYROIDISM 12/28/2009  . COPD (chronic obstructive pulmonary disease) ([place]) 12/28/2009  . ARTHRITIS 12/28/2009      Geraldine Solar PT, DPT  [facility] [address] [place], [place], [address] Phone: [phone]   Fax:  [phone]  Name: [name] MRN: [id number] Date of Birth: 1932/12/09

## 2017-12-31 ENCOUNTER — Ambulatory Visit: Payer: Self-pay | Admitting: Student

## 2017-12-31 ENCOUNTER — Ambulatory Visit: Payer: Self-pay | Admitting: Gynecologic Oncology

## 2017-12-31 ENCOUNTER — Telehealth: Payer: Self-pay | Admitting: *Deleted

## 2017-12-31 NOTE — Telephone Encounter (Signed)
Daughter called to see if patient can have teeth cleaned and have a pedicure since she is no longer taking taxol.

## 2017-12-31 NOTE — Telephone Encounter (Signed)
PLs proceed

## 2017-12-31 NOTE — Telephone Encounter (Signed)
Notified that it is OK to have dental cleaning and a pedicure.

## 2018-01-01 ENCOUNTER — Encounter (HOSPITAL_COMMUNITY): Payer: Self-pay | Admitting: Physical Therapy

## 2018-01-01 ENCOUNTER — Encounter (HOSPITAL_COMMUNITY): Payer: Self-pay | Admitting: Occupational Therapy

## 2018-01-01 ENCOUNTER — Ambulatory Visit (HOSPITAL_COMMUNITY): Payer: Medicare Other | Admitting: Physical Therapy

## 2018-01-01 DIAGNOSIS — R2689 Other abnormalities of gait and mobility: Secondary | ICD-10-CM

## 2018-01-01 DIAGNOSIS — M6281 Muscle weakness (generalized): Secondary | ICD-10-CM

## 2018-01-01 DIAGNOSIS — M79662 Pain in left lower leg: Secondary | ICD-10-CM

## 2018-01-01 NOTE — Therapy (Signed)
[place], [place], [address] Phone: [phone]   Fax:  [phone]  Physical Therapy Treatment  Patient Details  Name: [name] MRN: [id number] Date of Birth: Feb 02, 1933 Referring Provider: Dr. Heath Lark   Encounter Date: 01/01/2018  PT End of Session - 01/01/18 1049    Visit Number  5    Number of Visits  18    Date for PT Re-Evaluation  02/03/18 mini reassess on 01/13/18    Authorization Type  Medicare    Authorization Time Period  12/23/17 to 02/03/18    PT Start Time  1030    PT Stop Time  1115    PT Time Calculation (min)  45 min    Equipment Utilized During Treatment     Activity Tolerance  Patient tolerated treatment well;Patient limited by fatigue    Behavior During Therapy  [facility] for tasks assessed/performed       Past Medical History:  Diagnosis Date  . COPD (chronic obstructive pulmonary disease) ([place])   . Depression   . Diverticulosis   . Family hx of colon cancer   . Fibromyalgia   . GERD (gastroesophageal reflux disease)   . Hemorrhoids   . Hiatal hernia   . History of shingles   . Hypothyroidism   . IBS (irritable bowel syndrome)   . Lymphocytic colitis   . Osteoporosis   . Ovarian cancer ([place])   . Paroxysmal atrial fibrillation ([facility])   . Psoriatic arthritis ([place])   . Rectal polyp   . Schatzki's ring   . Uterine polyp     Past Surgical History:  Procedure Laterality Date  . CHOLECYSTECTOMY  Dec 04, 2010  . COLONOSCOPY    . FEMUR IM NAIL Left 12/11/2013   Procedure: INTRAMEDULLARY (IM) NAIL FEMORAL;  Surgeon: [name], MD;  [facility];  Service: Orthopedics;  Laterality: Left;  . FRACTURE SURGERY Left 12/11/2013   hip  . IR FLUORO GUIDE PORT INSERTION RIGHT  07/22/2017  . IR PARACENTESIS  07/12/2017  . IR US GUIDE VASC ACCESS RIGHT  07/22/2017  . KNEE SURGERY Left    x 2  . TOTAL KNEE REVISION  08/06/2012   Procedure: TOTAL KNEE REVISION;  Surgeon: [name], MD;  [facility];  Service: Orthopedics;  Laterality: Left;  Left Total Knee Arthroplasty Revision  . Uterine polypectomy      There were no vitals filed for this visit.  Subjective Assessment - 01/01/18 1025    Subjective  Pt states that she is not as sore as she has been.  She is doing her HEP about everyday.      Limitations  Walking;Standing    How long can you sit comfortably?  not limited    How long can you stand comfortably?  a few mins    How long can you walk comfortably?  89f or > at the nursing home    Patient Stated Goals  Would like to come to clinic to build up her strength and endurance    Currently in Pain?  Yes    Pain Score  4     Pain Location  Buttocks    Pain Orientation  Right;Left    Pain Descriptors / Indicators  Burning    Pain Onset  In the past 7 days    Pain Frequency  Constant  No data recorded       [facility] Adult PT Treatment/Exercise - 01/01/18 0001      Ambulation/Gait   Ambulation/Gait  Yes    Ambulation Distance (Feet)  226 Feet x2    Assistive device  Rolling walker      Exercises   Exercises  Knee/Hip      Knee/Hip Exercises: Aerobic   Nustep  5' level 2      Knee/Hip Exercises: Standing   Heel Raises  Both;10 reps    Knee Flexion  Strengthening;Both;15 reps    Hip Abduction  Stengthening;Both;10 reps    Abduction Limitations  BUE support    Hip Extension  Stengthening;Both;15 reps    Extension Limitations  BUE support     Functional Squat  10 reps    Rocker Board  1 minute    Gait Training  x2 laps around gym with RW (1 seated rest break after 1st lap; LLE weakness noted during gait)      Knee/Hip Exercises: Seated   Long Arc Quad  Both;10 reps    Long Arc Quad Limitations  3#    Other Seated Knee/Hip Exercises  scapular retraction x 15     Marching  10 reps    Sit to [employer]  10 reps               PT Short Term Goals - 12/23/17 1350      PT SHORT TERM GOAL #1   Title   Patient will ambulated >250 feet with rolling walker during the 3 MWT to improve her ability to ambulate further within her home before needing to rest.    Baseline  3/18: 89f with RW    Time  3    Period  Weeks    Status  On-going    Target Date  01/13/18      PT SHORT TERM GOAL #2   Title  Patient with tolerate the standing position with upper extremity support for 5 minutes before needing to rest to improve her ability to perform standing functional activities and ADLs within her home environment.    Baseline  3/18: able to stand 5 mins with no UE support    Time  3    Period  Weeks    Status  Achieved      PT SHORT TERM GOAL #3   Title  Patient will increase MMT grades by 1/2 grade to show improvement in her strength and stability to perform functional activities.    Baseline  3/18: see MMT    Time  3    Period  Weeks    Status  Partially Met      PT SHORT TERM GOAL #4   Title  Patient will be able to stand on either foot for 10 seconds to exhibit improved balance and decreased risk for falls.    Baseline  3/18: R: 7 sec with bil HHA, L: 10 sec with bil HHA    Time  3    Period  Weeks    Status  On-going        PT Long Term Goals - 12/23/17 1351      PT LONG TERM GOAL #1   Title  Patient will ambulated >500 feet with rolling walker during the 3 MWT to improve her ability to ambulate further  in community to allow to her go enjoy going out for a meal.    Baseline  3/18: 89fwith RA  Time  6    Period  Weeks    Status  On-going    Target Date  02/03/18      PT LONG TERM GOAL #2   Title  Patient with tolerate the standing position with upper extremity support for 10 minutes before needing to rest to improve her ability to stand and have a conversation with a friend or relative.    Baseline  3/18: able to stand 5 mins with no UE support    Time  6    Period  Weeks    Status  On-going      PT LONG TERM GOAL #3   Title  Patient will increase MMT grades by 1 grade  to show continued improvement in her strength and stability to perform functional activities at home independently.    Baseline  3/18: see MMT    Time  6    Period  Weeks    Status  Partially Met      PT LONG TERM GOAL #4   Title  Patient will be able to stand on either foot for 15 seconds to exhibit improved balance and further decreased risk for falls.    Baseline  3/18: R: 7 sec bil HHA, L: 10 sec bil HHA    Time  6    Period  Weeks    Status  On-going      PT LONG TERM GOAL #5   Title  Pt will be able to perform 5xSTS in 12 sec or < with no UE in order to demo improved functional BLE strength and overall balance.    Time  6    Period  Weeks    Status  New      Additional Long Term Goals   Additional Long Term Goals  Yes      PT LONG TERM GOAL #6   Title  Pt will report requiring assistance for ADLs and IADLs at home 25% of the time or < to demonstrate improved overall functional strength, endurance, and balance in order to promote independence at home.    Time  6    Period  Weeks    Status  New    Target Date  02/03/18            Plan - 01/01/18 1049    Clinical Impression Statement  Pt needed multiple rest breaks due to HR varying from 110 to 115.  O2 stats did not go below 97.  Added standing hip extension, rockerboard and wt with LAQ.     Rehab Potential  Good    PT Frequency  3x / week    PT Duration  6 weeks    PT Treatment/Interventions  ADLs/Self Care Home Management;Gait training;Stair training;Functional mobility training;Therapeutic activities;Patient/family education;Balance training;Neuromuscular re-education;Therapeutic exercise;Manual techniques;Energy conservation    PT Next Visit Plan  Allow adequate rest breaks for patient, monitor  HR; perform mixed standing and supine strengthening exercises for hips/glutes.     PT Home Exercise Plan  intial - lap walking at home; supine bridges; 3/18: supine clams with RTB; 3/25: SAQ    Consulted and Agree with Plan  of Care  Patient       Patient will benefit from skilled therapeutic intervention in order to improve the following deficits and impairments:  Abnormal gait, Pain, Decreased mobility, Decreased coordination, Decreased activity tolerance, Decreased endurance, Decreased strength, Decreased balance, Difficulty walking  Visit Diagnosis: Muscle weakness (generalized)  Pain in left lower leg  Other abnormalities of gait and mobility     Problem List Patient Active Problem List   Diagnosis Date Noted  . Anemia, chronic disease 12/13/2017  . Mild protein-calorie malnutrition ([place]) 12/13/2017  . Peripheral neuropathy due to chemotherapy ([place]) 12/13/2017  . S/P thoracentesis   . Atrial fibrillation with RVR ([place]) 11/08/2017  . HCAP (healthcare-associated pneumonia) 11/08/2017  . Pleural effusion on right 09/20/2017  . Acute on chronic diastolic heart failure ([place]) 08/19/2017  . Pancytopenia, acquired ([place]) 08/15/2017  . Protein-calorie malnutrition, moderate ([place]) 07/30/2017  . Generalized weakness 07/30/2017  . Antineoplastic chemotherapy induced pancytopenia ([place]) 07/20/2017  . Ovarian CA, right ([place]) 07/18/2017  . PNA (pneumonia) 07/07/2017  . Ascites 06/30/2017  . Ascites, malignant 06/30/2017  . Right ovarian cyst 01/18/2017  . Elevated CA-125 01/18/2017  . Depression 12/23/2015  . Peripheral edema 08/23/2015  . Fracture of hip, left, closed ([place]) 12/08/2013  . Hip fracture ([place]) 12/08/2013  . PAD (peripheral artery disease) ([place]) 11/25/2013  . Encounter for therapeutic drug monitoring 11/09/2013  . Atrial fibrillation ([place]) 05/07/2013  . Cough 03/20/2013  . Total knee replacement status 10/07/2012  . Knee pain 10/07/2012  . Knee stiffness 10/07/2012  . Tachycardia 09/17/2012  . Chest pain 09/17/2012  . Difficulty in walking(719.7) 09/16/2012  . Muscle weakness (generalized) 09/16/2012  . Postop Acute blood loss anemia 08/08/2012  . Instability of prosthetic knee ([place])  08/06/2012  . Bloating 02/14/2012  . Upper abdominal pain 02/14/2012  . Allergic rhinitis, seasonal 02/15/2011  . COLITIS 02/20/2010  . Diarrhea 01/02/2010  . ABDOMINAL PAIN -GENERALIZED 01/02/2010  . PERSONAL HX COLONIC POLYPS 01/02/2010  . HYPOTHYROIDISM 12/28/2009  . COPD (chronic obstructive pulmonary disease) ([place]) 12/28/2009  . ARTHRITIS 12/28/2009   Rayetta Humphrey, PT CLT [phone] 01/01/2018, 11:15 AM  [facility] [address] [place], [place], [address] Phone: [phone]   Fax:  [phone]  Name: [name] MRN: [id number] Date of Birth: 1932/10/13

## 2018-01-02 ENCOUNTER — Other Ambulatory Visit: Payer: Self-pay | Admitting: *Deleted

## 2018-01-03 ENCOUNTER — Telehealth: Payer: Self-pay | Admitting: Hematology and Oncology

## 2018-01-03 ENCOUNTER — Encounter: Payer: Self-pay | Admitting: Hematology and Oncology

## 2018-01-03 ENCOUNTER — Inpatient Hospital Stay: Payer: Medicare Other

## 2018-01-03 ENCOUNTER — Encounter (HOSPITAL_COMMUNITY): Payer: Self-pay

## 2018-01-03 ENCOUNTER — Inpatient Hospital Stay (HOSPITAL_BASED_OUTPATIENT_CLINIC_OR_DEPARTMENT_OTHER): Payer: Medicare Other | Admitting: Hematology and Oncology

## 2018-01-03 ENCOUNTER — Encounter (HOSPITAL_COMMUNITY): Payer: Self-pay | Admitting: Occupational Therapy

## 2018-01-03 DIAGNOSIS — E441 Mild protein-calorie malnutrition: Secondary | ICD-10-CM

## 2018-01-03 DIAGNOSIS — C561 Malignant neoplasm of right ovary: Secondary | ICD-10-CM

## 2018-01-03 DIAGNOSIS — R531 Weakness: Secondary | ICD-10-CM | POA: Diagnosis not present

## 2018-01-03 DIAGNOSIS — K862 Cyst of pancreas: Secondary | ICD-10-CM

## 2018-01-03 DIAGNOSIS — J90 Pleural effusion, not elsewhere classified: Secondary | ICD-10-CM

## 2018-01-03 DIAGNOSIS — Z5111 Encounter for antineoplastic chemotherapy: Secondary | ICD-10-CM | POA: Diagnosis not present

## 2018-01-03 DIAGNOSIS — I482 Chronic atrial fibrillation: Secondary | ICD-10-CM | POA: Diagnosis not present

## 2018-01-03 DIAGNOSIS — Z7901 Long term (current) use of anticoagulants: Secondary | ICD-10-CM

## 2018-01-03 DIAGNOSIS — D696 Thrombocytopenia, unspecified: Secondary | ICD-10-CM

## 2018-01-03 DIAGNOSIS — D649 Anemia, unspecified: Secondary | ICD-10-CM

## 2018-01-03 DIAGNOSIS — Z79899 Other long term (current) drug therapy: Secondary | ICD-10-CM

## 2018-01-03 DIAGNOSIS — J449 Chronic obstructive pulmonary disease, unspecified: Secondary | ICD-10-CM

## 2018-01-03 DIAGNOSIS — J431 Panlobular emphysema: Secondary | ICD-10-CM

## 2018-01-03 DIAGNOSIS — I4819 Other persistent atrial fibrillation: Secondary | ICD-10-CM

## 2018-01-03 LAB — CBC WITH DIFFERENTIAL (CANCER CENTER ONLY)
Basophils Absolute: 0 10*3/uL (ref 0.0–0.1)
Basophils Relative: 0 %
EOS PCT: 1 %
Eosinophils Absolute: 0 10*3/uL (ref 0.0–0.5)
HCT: 32.9 % — ABNORMAL LOW (ref 34.8–46.6)
HEMOGLOBIN: 11.2 g/dL — AB (ref 11.6–15.9)
LYMPHS ABS: 1.5 10*3/uL (ref 0.9–3.3)
LYMPHS PCT: 31 %
MCH: 33.5 pg (ref 25.1–34.0)
MCHC: 34 g/dL (ref 31.5–36.0)
MCV: 98.5 fL (ref 79.5–101.0)
Monocytes Absolute: 0.4 10*3/uL (ref 0.1–0.9)
Monocytes Relative: 8 %
Neutro Abs: 2.8 10*3/uL (ref 1.5–6.5)
Neutrophils Relative %: 60 %
Platelet Count: 49 10*3/uL — ABNORMAL LOW (ref 145–400)
RBC: 3.34 MIL/uL — AB (ref 3.70–5.45)
RDW: 13.7 % (ref 11.2–14.5)
WBC: 4.8 10*3/uL (ref 3.9–10.3)

## 2018-01-03 LAB — CMP (CANCER CENTER ONLY)
ALBUMIN: 3.3 g/dL — AB (ref 3.5–5.0)
ALT: 10 U/L (ref 0–55)
AST: 14 U/L (ref 5–34)
Alkaline Phosphatase: 65 U/L (ref 40–150)
Anion gap: 7 (ref 3–11)
BUN: 12 mg/dL (ref 7–26)
CHLORIDE: 101 mmol/L (ref 98–109)
CO2: 27 mmol/L (ref 22–29)
Calcium: 8.9 mg/dL (ref 8.4–10.4)
Creatinine: 0.9 mg/dL (ref 0.60–1.10)
GFR, EST NON AFRICAN AMERICAN: 58 mL/min — AB (ref >60)
GFR, Est AFR Am: >60 mL/min (ref >60)
Glucose, Bld: 106 mg/dL (ref 70–140)
POTASSIUM: 3.7 mmol/L (ref 3.5–5.1)
SODIUM: 135 mmol/L — AB (ref 136–145)
Total Bilirubin: 0.4 mg/dL (ref 0.2–1.2)
Total Protein: 6.7 g/dL (ref 6.4–8.3)

## 2018-01-03 MED ORDER — HYDROCODONE-ACETAMINOPHEN 10-325 MG PO TABS: 1.0000 | ORAL_TABLET | Freq: Four times a day (QID) | ORAL | 0 refills | Status: DC | PRN

## 2018-01-03 MED ORDER — SODIUM CHLORIDE 0.9% FLUSH
10.0000 mL | Freq: Once | INTRAVENOUS | Status: AC
  Administered 2018-01-03: 10 mL
  Filled 2018-01-03: qty 10

## 2018-01-03 NOTE — Patient Outreach (Signed)
[facility]) Care Management  01/03/2018  Semone Orlov [place] [birth year] [phone]   CSW called & spoke with patient & her daughter, [name] to follow-up on discharge from [facility] where she was for 34 days (2/8 - 12/19/17). Patient reports that she has been doing well and was getting ready to go down to [facility] for a chemo treatment with her daughter transporting her. CSW had called Waymond Cera with [facility] [place] to get patient's name put on the waiting list for Meals on Wheels, though they have a 4 month wait as of now - they had previously had 18 months and perhaps patient will not need to wait even 4 months. Patient reports that she can wait 4 months and that there is no rush as she has some assistance in the home. CSW scheduled next encounter as in-home for CSW to scan in advance directives on 01/07/18.    Raynaldo Opitz, LCSW [facility]  Clinical Social Worker cell #: [phone]

## 2018-01-03 NOTE — Progress Notes (Signed)
[place] OFFICE PROGRESS NOTE  Patient Care Team: Asencion Noble, MD as PCP - General (Internal Medicine) Standley Brooking, LCSW as [facility] [facility] (Licensed Clinical Social Worker) Barbaraann Faster, RN as [place] [facility] Domenic Polite, Aloha Gell, MD as Consulting Physician (Cardiology) Ahmed Prima, Fransisco Hertz, PA-C as Physician Assistant (Physician Assistant) Heath Lark, MD as Consulting Physician (Hematology and Oncology) Collene Gobble, MD as Consulting Physician (Pulmonary Disease)  ASSESSMENT & PLAN:  Ovarian CA, right [facility]) The patient is feeling well She has no signs or symptoms of recurrent pleural effusion or ascites Due to low platelet count, we will reschedule her chemotherapy She will continue single agent carboplatin for a few more cycles with plan to repeat CT imaging in May for possible surgery in the future I plan to reduce the dose of carboplatin to AUC of 5 in the future  Thrombocytopenia [facility]) This is likely due to recent treatment.  I will hold treatment today and reduce future chemotherapy We will reschedule her treatment to April 9 There is no contraindication to remain on antiplatelet agents or anticoagulants as long as the platelet is greater than 50,000.   Mild protein-calorie malnutrition ([place]) She is eating well We will continue nutritional supplement and Remeron for appetite stimulant  COPD (chronic obstructive pulmonary disease) ([place]) She has no recent exacerbation of COPD She will continue inhaler She is off oxygen therapy  Atrial fibrillation ([place]) She has chronic atrial fibrillation She is on anticoagulation therapy We discussed the risk, benefits, side effects of anticoagulation therapy in the setting of thrombocytopenia and recent minor bleeding I recommend the patient to continue treatment and to report any excessive bleeding   No orders of the defined types were placed in this  encounter.   INTERVAL HISTORY: Please see below for problem oriented charting. She returns for chemotherapy and follow-up She is off oxygen therapy Fluid retention is gone She continues low-dose Lasix therapy She denies recent infection She is eating well She denies peripheral neuropathy Overall, she felt her energy levels are improved and back to her baseline She denies recent fall She continues on outpatient rehab She had minor nosebleed on and off the last week or so The nosebleed is brisk but self-limiting She denies hematuria or hematochezia She denies abdominal distention, nausea, vomiting or changes in bowel habits  SUMMARY OF ONCOLOGIC HISTORY:   Ovarian CA, right ([place])   02/18/2016 Tumor Marker    Patient's tumor was tested for the following markers: CA125 Results of the tumor marker test revealed 45      05/22/2016 Tumor Marker    Patient's tumor was tested for the following markers: CA125 Results of the tumor marker test revealed 53      05/22/2016 Imaging    Outside pelvic US showed 4.1 cm adnexa mass      06/24/2017 Imaging    Ct abdomen and pelvis:  1. Interim finding of moderate ascites within the abdomen and pelvis with additional finding of diffuse nodular infiltration of the omentum and anterior mesenteric fat, the appearance would be consistent with peritoneal carcinomatosis/metastatic disease. Increasing retroperitoneal and upper abdominal adenopathy. 2. Re- demonstrated 3.8 cm cyst in the right adnexa. Enlarging soft tissue density in the left adnexa now with possible cystic component posteriorly. In light of the above findings, concern is for ovarian neoplasm. Correlation with pelvic ultrasound recommended. 3. Small right-sided pleural effusion, new since prior study 4. Stable hypodense splenic lesions since 2017.  06/25/2017 Imaging    US pelvis: 2.9 cm simple appearing cyst in the right ovary. Left ovary grossly unremarkable. Large volume  ascites in the pelvis      06/30/2017 - 07/01/2017 Hospital Admission    She was admitted for evaluation of abdominal pain and ascites      07/01/2017 Pathology Results    PERITONEAL/ASCITIC FLUID(SPECIMEN 1 OF 1 COLLECTED 07/01/17): - POORLY DIFFERENTIATED CARCINOMA; SEE COMMENT Source Peritoneal/Ascitic Fluid, (specimen 1 of 1 collected 07/01/17) Gross Specimen: Received is/are 1000 cc's of brownish fluid. (BS:bs) Prepared: # Smears: 0 # Concentration Technique Slides (i.e. ThinPrep): 1 # Cell Block: 1 Additional Studies: Also received Hematology slide - [id number]. Comment The tumor cells are positive for cytokeratin 7 and Pax-8 but negative for cytokeratin 20, CDX-2, GATA-3, Napsin-A and TTF-1. Based on the immunoprofile a gynecology primary is favored      07/01/2017 Procedure    Successful ultrasound-guided diagnostic and therapeutic paracentesis yielding 2.5 liters of peritoneal fluid      07/07/2017 - 07/09/2017 Hospital Admission    She was admitted for management of malignant ascites      07/08/2017 Procedure    Successful ultrasound-guided therapeutic paracentesis yielding 2.7 liters liters of peritoneal fluid      07/12/2017 Procedure    Successful ultrasound-guided paracentesis yielding 1450 mL of peritoneal fluid      07/18/2017 - 07/24/2017 Hospital Admission    She was admitted for expedited treatment      07/18/2017 Tumor Marker    Patient's tumor was tested for the following markers: CA125 Results of the tumor marker test revealed 1941      07/19/2017 - 11/04/2017 Chemotherapy    The patient had 6 cycles of carboplatin & Taxol for chemotherapy treatment.        08/06/2017 Procedure    Successful ultrasound-guided therapeutic paracentesis yielding 2.6 liters of peritoneal fluid.      08/09/2017 Tumor Marker    Patient's tumor was tested for the following markers: CA125 Results of the tumor marker test revealed 1665      08/15/2017 Tumor Marker     Patient's tumor was tested for the following markers: CA125 Results of the tumor marker test revealed 937.9      08/20/2017 Imaging    ECHO: Normal LV size with EF 60-65%. Normal RV size and systolic function. No significant valvular abnormalities.      09/18/2017 Imaging    Chest Impression:  1. No evidence thoracic metastasis. 2. Interval increase and RIGHT pleural effusion.  Abdomen / Pelvis Impression:  1. Interval decrease in intraperitoneal free fluid. 2. Interval decrease in omental nodularity in the LEFT ventral peritoneal space. 3. Interval decrease in nodularity associated with the LEFT ovary. 4. Cystic portion of the RIGHT ovary is increased mildly in size.      09/20/2017 Tumor Marker    Patient's tumor was tested for the following markers: CA125 Results of the tumor marker test revealed 347      10/14/2017 Tumor Marker    Patient's tumor was tested for the following markers: CA125 Results of the tumor marker test revealed 307.4      11/04/2017 Tumor Marker    Patient's tumor was tested for the following markers: CA125 Results of the tumor marker test revealed 262.5      11/28/2017 Imaging    1. Interval decrease in right pleural effusion with resolution of right atelectasis seen previously. 2. New small left pleural effusion, symmetric to the right. 3. No intraperitoneal free  fluid on the current study. 4. Continued further decrease in left omental disease, appearing less confluent today than on the prior study. 5. Left ovary remains normal in appearance today and the right adnexal cystic lesion is decreased in size compared to prior study. 6. 14 mm pancreatic cyst is unchanged. Continued attention on follow-up imaging recommended. 7. Aortic Atherosclerois (ICD10-170.0)      12/13/2017 Tumor Marker    Patient's tumor was tested for the following markers: CA125 Results of the tumor marker test revealed 197.7       REVIEW OF SYSTEMS:   Constitutional:  Denies fevers, chills or abnormal weight loss Eyes: Denies blurriness of vision Ears, nose, mouth, throat, and face: Denies mucositis or sore throat Respiratory: Denies cough, dyspnea or wheezes Cardiovascular: Denies palpitation, chest discomfort or lower extremity swelling Gastrointestinal:  Denies nausea, heartburn or change in bowel habits Skin: Denies abnormal skin rashes Lymphatics: Denies new lymphadenopathy or easy bruising Neurological:Denies numbness, tingling or new weaknesses Behavioral/Psych: Mood is stable, no new changes  All other systems were reviewed with the patient and are negative.  I have reviewed the past medical history, past surgical history, social history and family history with the patient and they are unchanged from previous note.  ALLERGIES:  is allergic to codeine.  MEDICATIONS:  Current Outpatient Medications  Medication Sig Dispense Refill  . albuterol (PROAIR HFA) 108 (90 Base) MCG/ACT inhaler Inhale 2 puffs into the lungs every 6 (six) hours as needed for wheezing or shortness of breath. 1 Inhaler 3  . ALPRAZolam (XANAX) 0.25 MG tablet Take 1 tablet (0.25 mg total) by mouth at bedtime as needed for anxiety. 5 tablet 0  . azelastine (ASTELIN) 0.1 % nasal spray Place 2 sprays into both nostrils 2 (two) times daily as needed for allergies.     . Cholecalciferol (VITAMIN D) 2000 units CAPS Take 2,000 Units by mouth daily.    Marland Kitchen diltiazem (CARTIA XT) 120 MG 24 hr capsule TAKE 1 CAPSULE BY MOUTH EVERY DAY 90 capsule 3  . diphenhydrAMINE (BENADRYL) 25 mg capsule Take 25 mg by mouth every 6 (six) hours as needed for itching.    . diphenoxylate-atropine (LOMOTIL) 2.5-0.025 MG tablet Take 1 tablet by mouth 4 (four) times daily. (Patient taking differently: Take 1 tablet by mouth 4 (four) times daily as needed for diarrhea or loose stools. ) 360 tablet 1  . DULoxetine (CYMBALTA) 30 MG capsule Take 1 capsule (30 mg total) by mouth daily. 30 capsule 0  . fluticasone  (FLONASE) 50 MCG/ACT nasal spray Place 2 sprays into both nostrils 2 (two) times daily. For nasal congestion    . furosemide (LASIX) 20 MG tablet Take 1 tablet (20 mg total) by mouth daily. 90 tablet 9  . HYDROcodone-acetaminophen (NORCO) 10-325 MG tablet Take 1 tablet by mouth every 6 (six) hours as needed. 60 tablet 0  . HYDROcodone-acetaminophen (NORCO/VICODIN) 5-325 MG tablet Take 2 tablets by mouth every 4 (four) hours as needed for moderate pain or severe pain.    Marland Kitchen ipratropium-albuterol (DUONEB) 0.5-2.5 (3) MG/3ML SOLN Take 3 mLs by nebulization 3 (three) times daily. (Patient taking differently: Take 3 mLs by nebulization every 6 (six) hours as needed (cough or SOB). As needed) 360 mL   . levothyroxine (SYNTHROID, LEVOTHROID) 137 MCG tablet Take 137 mcg by mouth every morning. For thyroid therapy    . lidocaine-prilocaine (EMLA) cream Apply 1 application topically as needed. 30 g 6  . loperamide (IMODIUM) 2 MG capsule Take 2 capsules (  4 mg total) by mouth 3 (three) times daily. 30 capsule 0  . mirtazapine (REMERON) 15 MG tablet Take 1 tablet (15 mg total) by mouth at bedtime. 30 tablet 1  . ondansetron (ZOFRAN) 8 MG tablet Take 1 tablet (8 mg total) by mouth every 8 (eight) hours as needed for nausea or vomiting. (Patient not taking: Reported on 12/27/2017) 60 tablet 0  . polyvinyl alcohol (LIQUIFILM TEARS) 1.4 % ophthalmic solution 1 drop 2 (two) times daily as needed for dry eyes.    . potassium chloride (K-DUR) 10 MEQ tablet Take 10 mEq by mouth daily.    . prochlorperazine (COMPAZINE) 10 MG tablet Take 1 tablet (10 mg total) by mouth every 6 (six) hours as needed (Nausea or vomiting). 30 tablet 1  . tiotropium (SPIRIVA) 18 MCG inhalation capsule Place 18 mcg into inhaler and inhale daily.    . Tiotropium Bromide-Olodaterol (STIOLTO RESPIMAT IN) Inhale 2 puffs into the lungs daily.    Alveda Reasons 20 MG TABS tablet TAKE 1 TABLET BY MOUTH EVERY DAY WITH SUPPER 90 tablet 0   No current  facility-administered medications for this visit.     PHYSICAL EXAMINATION: ECOG PERFORMANCE STATUS: 1 - Symptomatic but completely ambulatory  Vitals:   01/03/18 1329  BP: 134/69  Pulse: (!) 106  Resp: 18  Temp: (!) 97.5 F (36.4 C)  SpO2: 98%   Filed Weights   01/03/18 1329  Weight: 162 lb 3.2 oz (73.6 kg)    GENERAL:alert, no distress and comfortable SKIN: skin color, texture, turgor are normal, no rashes or significant lesions EYES: normal, Conjunctiva are pink and non-injected, sclera clear OROPHARYNX:no exudate, no erythema and lips, buccal mucosa, and tongue normal  NECK: supple, thyroid normal size, non-tender, without nodularity LYMPH:  no palpable lymphadenopathy in the cervical, axillary or inguinal LUNGS: clear to auscultation and percussion with normal breathing effort HEART: regular rate & rhythm and no murmurs and no lower extremity edema ABDOMEN:abdomen soft, non-tender and normal bowel sounds Musculoskeletal:no cyanosis of digits and no clubbing  NEURO: alert & oriented x 3 with fluent speech, no focal motor/sensory deficits  LABORATORY DATA:  I have reviewed the data as listed    Component Value Date/Time   NA 135 (L) 01/03/2018 1305   NA 135 (L) 09/24/2017 0945   K 3.7 01/03/2018 1305   K 3.7 09/24/2017 0945   CL 101 01/03/2018 1305   CO2 27 01/03/2018 1305   CO2 30 (H) 09/24/2017 0945   GLUCOSE 106 01/03/2018 1305   GLUCOSE 101 09/24/2017 0945   BUN 12 01/03/2018 1305   BUN 15.3 09/24/2017 0945   CREATININE 0.90 01/03/2018 1305   CREATININE 0.8 09/24/2017 0945   CALCIUM 8.9 01/03/2018 1305   CALCIUM 9.1 09/24/2017 0945   PROT 6.7 01/03/2018 1305   PROT 6.6 09/24/2017 0945   ALBUMIN 3.3 (L) 01/03/2018 1305   ALBUMIN 3.3 (L) 09/24/2017 0945   AST 14 01/03/2018 1305   AST 16 09/24/2017 0945   ALT 10 01/03/2018 1305   ALT 12 09/24/2017 0945   ALKPHOS 65 01/03/2018 1305   ALKPHOS 52 09/24/2017 0945   BILITOT 0.4 01/03/2018 1305   BILITOT  1.13 09/24/2017 0945   GFRNONAA 58 (L) 01/03/2018 1305   GFRAA >60 01/03/2018 1305    No results found for: SPEP, UPEP  Lab Results  Component Value Date   WBC 4.8 01/03/2018   NEUTROABS 2.8 01/03/2018   HGB 12.1 12/17/2017   HCT 32.9 (L) 01/03/2018  MCV 98.5 01/03/2018   PLT 49 (L) 01/03/2018      Chemistry      Component Value Date/Time   NA 135 (L) 01/03/2018 1305   NA 135 (L) 09/24/2017 0945   K 3.7 01/03/2018 1305   K 3.7 09/24/2017 0945   CL 101 01/03/2018 1305   CO2 27 01/03/2018 1305   CO2 30 (H) 09/24/2017 0945   BUN 12 01/03/2018 1305   BUN 15.3 09/24/2017 0945   CREATININE 0.90 01/03/2018 1305   CREATININE 0.8 09/24/2017 0945      Component Value Date/Time   CALCIUM 8.9 01/03/2018 1305   CALCIUM 9.1 09/24/2017 0945   ALKPHOS 65 01/03/2018 1305   ALKPHOS 52 09/24/2017 0945   AST 14 01/03/2018 1305   AST 16 09/24/2017 0945   ALT 10 01/03/2018 1305   ALT 12 09/24/2017 0945   BILITOT 0.4 01/03/2018 1305   BILITOT 1.13 09/24/2017 0945       RADIOGRAPHIC STUDIES: I have personally reviewed the radiological images as listed and agreed with the findings in the report. Dg Chest Portable 1 View  Result Date: 12/17/2017 CLINICAL DATA:  84 year old female with tachycardia beginning about 1645 hours. History of atrial fibrillation and ovarian cancer. EXAM: PORTABLE CHEST 1 VIEW COMPARISON:  restaging CT chest abdomen and pelvis 11/28/2017 and earlier. FINDINGS: Portable AP semi upright view at 1842 hours. Stable right chest Port-A-Cath. Mediastinal contours are stable and within normal limits. Continued bilateral pleural effusions, which appear stable and small. No pneumothorax or pulmonary edema. No definite acute pulmonary opacity. IMPRESSION: 1. Small bilateral pleural effusions appear stable since February. 2. No new cardiopulmonary abnormality. Electronically Signed   By: Genevie Ann M.D.   On: 12/17/2017 19:14    All questions were answered. The patient knows to  call the clinic with any problems, questions or concerns. No barriers to learning was detected.  I spent 30 minutes counseling the patient face to face. The total time spent in the appointment was 40 minutes and more than 50% was on counseling and review of test results  Heath Lark, MD 01/03/2018 2:15 PM

## 2018-01-03 NOTE — Telephone Encounter (Signed)
Gave avs and calendar ° °

## 2018-01-03 NOTE — Assessment & Plan Note (Signed)
This is likely due to recent treatment.  I will hold treatment today and reduce future chemotherapy We will reschedule her treatment to April 9 There is no contraindication to remain on antiplatelet agents or anticoagulants as long as the platelet is greater than 50,000.

## 2018-01-03 NOTE — Assessment & Plan Note (Signed)
She is eating well We will continue nutritional supplement and Remeron for appetite stimulant

## 2018-01-03 NOTE — Assessment & Plan Note (Signed)
She has chronic atrial fibrillation She is on anticoagulation therapy We discussed the risk, benefits, side effects of anticoagulation therapy in the setting of thrombocytopenia and recent minor bleeding I recommend the patient to continue treatment and to report any excessive bleeding

## 2018-01-03 NOTE — Assessment & Plan Note (Signed)
The patient is feeling well She has no signs or symptoms of recurrent pleural effusion or ascites Due to low platelet count, we will reschedule her chemotherapy She will continue single agent carboplatin for a few more cycles with plan to repeat CT imaging in May for possible surgery in the future I plan to reduce the dose of carboplatin to AUC of 5 in the future

## 2018-01-03 NOTE — Assessment & Plan Note (Signed)
She has no recent exacerbation of COPD She will continue inhaler She is off oxygen therapy

## 2018-01-04 LAB — CA 125: CANCER ANTIGEN (CA) 125: 183.1 U/mL — AB (ref 0.0–38.1)

## 2018-01-06 ENCOUNTER — Encounter (HOSPITAL_COMMUNITY): Payer: Self-pay

## 2018-01-06 ENCOUNTER — Ambulatory Visit (HOSPITAL_COMMUNITY): Payer: Medicare Other | Attending: Hematology and Oncology

## 2018-01-06 DIAGNOSIS — R2689 Other abnormalities of gait and mobility: Secondary | ICD-10-CM | POA: Diagnosis present

## 2018-01-06 DIAGNOSIS — M6281 Muscle weakness (generalized): Secondary | ICD-10-CM | POA: Diagnosis present

## 2018-01-06 DIAGNOSIS — M79662 Pain in left lower leg: Secondary | ICD-10-CM

## 2018-01-06 NOTE — Therapy (Signed)
[place], [place], [address] Phone: [phone]   Fax:  [phone]  Physical Therapy Treatment  Patient Details  Name: [name] MRN: [id number] Date of Birth: May 07, 1933 Referring Provider: Dr. Heath Lark   Encounter Date: 01/06/2018  PT End of Session - 01/06/18 1031    Visit Number  6    Number of Visits  18    Date for PT Re-Evaluation  02/03/18 mini reassess on 01/13/18    Authorization Type  Medicare    Authorization Time Period  12/23/17 to 02/03/18    PT Start Time  1031    PT Stop Time  1113    PT Time Calculation (min)  42 min    Equipment Utilized During Treatment  Oxygen    Activity Tolerance  Patient tolerated treatment well;Patient limited by fatigue    Behavior During Therapy  [facility] for tasks assessed/performed       Past Medical History:  Diagnosis Date  . COPD (chronic obstructive pulmonary disease) ([place])   . Depression   . Diverticulosis   . Family hx of colon cancer   . Fibromyalgia   . GERD (gastroesophageal reflux disease)   . Hemorrhoids   . Hiatal hernia   . History of shingles   . Hypothyroidism   . IBS (irritable bowel syndrome)   . Lymphocytic colitis   . Osteoporosis   . Ovarian cancer ([place])   . Paroxysmal atrial fibrillation ([facility])   . Psoriatic arthritis ([place])   . Rectal polyp   . Schatzki's ring   . Uterine polyp     Past Surgical History:  Procedure Laterality Date  . CHOLECYSTECTOMY  Dec 04, 2010  . COLONOSCOPY    . FEMUR IM NAIL Left 12/11/2013   Procedure: INTRAMEDULLARY (IM) NAIL FEMORAL;  Surgeon: [name], MD;  Location: [facility];  Service: Orthopedics;  Laterality: Left;  . FRACTURE SURGERY Left 12/11/2013   hip  . IR FLUORO GUIDE PORT INSERTION RIGHT  07/22/2017  . IR PARACENTESIS  07/12/2017  . IR US GUIDE VASC ACCESS RIGHT  07/22/2017  . KNEE SURGERY Left    x 2  . TOTAL KNEE REVISION  08/06/2012   Procedure: TOTAL KNEE REVISION;  Surgeon:  [name], MD;  Location: [facility];  Service: Orthopedics;  Laterality: Left;  Left Total Knee Arthroplasty Revision  . Uterine polypectomy      There were no vitals filed for this visit.  Subjective Assessment - 01/06/18 1033    Subjective  Pt states that she did not get chemo Friday because her platelets were low. She denies any c/o weakness. Her legs are feeling alright, her bottom still hurts when she walks.     Limitations  Walking;Standing    How long can you sit comfortably?  not limited    How long can you stand comfortably?  a few mins    How long can you walk comfortably?  89f or > at the nursing home    Patient Stated Goals  Would like to come to clinic to build up her strength and endurance    Currently in Pain?  No/denies    Pain Onset  In the past 7 days                [facility] 01[facility] 0001      Knee/Hip Exercises: Aerobic   Nustep  5 mins, L1, seat 8; HIIT (10" fast,  50" normal)      Knee/Hip Exercises: Standing   Heel Raises  Both;10 reps    Heel Raises Limitations  heel and toe    Knee Flexion  Strengthening;Both;15 reps    Lateral Step Up  Both;10 reps;Hand Hold: 2;Step Height: 2"    Forward Step Up  Both;10 reps;Hand Hold: 2;Step Height: 2"    Functional Squat  10 reps    Other Standing Knee Exercises  sidestepping in // bars x3RT (no UE)      Knee/Hip Exercises: Seated   Long Arc Quad  Both;15 reps    Long Arc Quad Limitations  3#    Marching  Both;15 reps    Marching Limitations  3# BLE              PT Short Term Goals - 12/23/17 1350      PT SHORT TERM GOAL #1   Title  Patient will ambulated >250 feet with rolling walker during the 3 MWT to improve her ability to ambulate further within her home before needing to rest.    Baseline  3/18: 89f with RW    Time  3    Period  Weeks    Status  On-going    Target Date  01/13/18      PT SHORT TERM GOAL #2   Title  Patient with tolerate the standing  position with upper extremity support for 5 minutes before needing to rest to improve her ability to perform standing functional activities and ADLs within her home environment.    Baseline  3/18: able to stand 5 mins with no UE support    Time  3    Period  Weeks    Status  Achieved      PT SHORT TERM GOAL #3   Title  Patient will increase MMT grades by 1/2 grade to show improvement in her strength and stability to perform functional activities.    Baseline  3/18: see MMT    Time  3    Period  Weeks    Status  Partially Met      PT SHORT TERM GOAL #4   Title  Patient will be able to stand on either foot for 10 seconds to exhibit improved balance and decreased risk for falls.    Baseline  3/18: R: 7 sec with bil HHA, L: 10 sec with bil HHA    Time  3    Period  Weeks    Status  On-going        PT Long Term Goals - 12/23/17 1351      PT LONG TERM GOAL #1   Title  Patient will ambulated >500 feet with rolling walker during the 3 MWT to improve her ability to ambulate further  in community to allow to her go enjoy going out for a meal.    Baseline  3/18: 89fwith RA    Time  6    Period  Weeks    Status  On-going    Target Date  02/03/18      PT LONG TERM GOAL #2   Title  Patient with tolerate the standing position with upper extremity support for 10 minutes before needing to rest to improve her ability to stand and have a conversation with a friend or relative.    Baseline  3/18: able to stand 5 mins with no UE support    Time  6    Period  Weeks  Status  On-going      PT LONG TERM GOAL #3   Title  Patient will increase MMT grades by 1 grade to show continued improvement in her strength and stability to perform functional activities at home independently.    Baseline  3/18: see MMT    Time  6    Period  Weeks    Status  Partially Met      PT LONG TERM GOAL #4   Title  Patient will be able to stand on either foot for 15 seconds to exhibit improved balance and further  decreased risk for falls.    Baseline  3/18: R: 7 sec bil HHA, L: 10 sec bil HHA    Time  6    Period  Weeks    Status  On-going      PT LONG TERM GOAL #5   Title  Pt will be able to perform 5xSTS in 12 sec or < with no UE in order to demo improved functional BLE strength and overall balance.    Time  6    Period  Weeks    Status  New      Additional Long Term Goals   Additional Long Term Goals  Yes      PT LONG TERM GOAL #6   Title  Pt will report requiring assistance for ADLs and IADLs at home 25% of the time or < to demonstrate improved overall functional strength, endurance, and balance in order to promote independence at home.    Time  6    Period  Weeks    Status  New    Target Date  02/03/18            Plan - 01/06/18 1114    Clinical Impression Statement  Continued with established POC focusing on generalized strengthening and endurance. Pt reporting continued hip/buttock pain which is increased at night; she thinks this is due to her psoriatic arthritis but will continue to monitor in session and modify exercises as appropriate. Min cues required during session for proper exercise technique. Pt's HR max during session was 134bpm, but this decreased to 99bpm after 2-3 mins rest break; HR stayed below 127bpm for remainder of session. Had pt perform HIIT on NuStep this date by having her pedal fast for 10 seconds and then pedal normal for 50 seconds. Pt tolerated entire session well, not reporting any hip pain during session, just muscle fatigue. Continue as planned, progressing as able.    Rehab Potential  Good    PT Frequency  3x / week    PT Duration  6 weeks    PT Treatment/Interventions  ADLs/Self Care Home Management;Gait training;Stair training;Functional mobility training;Therapeutic activities;Patient/family education;Balance training;Neuromuscular re-education;Therapeutic exercise;Manual techniques;Energy conservation    PT Next Visit Plan  Allow adequate rest  breaks for patient, monitor HR; continue stregnthening and endurance activities for hip/glutes strength; continue HIIT on Nustep    PT Home Exercise Plan  intial - lap walking at home; supine bridges; 3/18: supine clams with RTB; 3/25: SAQ    Consulted and Agree with Plan of Care  Patient       Patient will benefit from skilled therapeutic intervention in order to improve the following deficits and impairments:  Abnormal gait, Pain, Decreased mobility, Decreased coordination, Decreased activity tolerance, Decreased endurance, Decreased strength, Decreased balance, Difficulty walking  Visit Diagnosis: Muscle weakness (generalized)  Pain in left lower leg  Other abnormalities of gait and mobility  Problem List Patient Active Problem List   Diagnosis Date Noted  . Thrombocytopenia ([place]) 01/03/2018  . Anemia, chronic disease 12/13/2017  . Mild protein-calorie malnutrition ([place]) 12/13/2017  . Peripheral neuropathy due to chemotherapy ([place]) 12/13/2017  . S/P thoracentesis   . Atrial fibrillation with RVR ([place]) 11/08/2017  . HCAP (healthcare-associated pneumonia) 11/08/2017  . Acute on chronic diastolic heart failure ([place]) 08/19/2017  . Pancytopenia, acquired ([place]) 08/15/2017  . Protein-calorie malnutrition, moderate ([place]) 07/30/2017  . Generalized weakness 07/30/2017  . Antineoplastic chemotherapy induced pancytopenia ([place]) 07/20/2017  . Ovarian CA, right ([place]) 07/18/2017  . PNA (pneumonia) 07/07/2017  . Ascites 06/30/2017  . Ascites, malignant 06/30/2017  . Right ovarian cyst 01/18/2017  . Elevated CA-125 01/18/2017  . Depression 12/23/2015  . Peripheral edema 08/23/2015  . Fracture of hip, left, closed ([place]) 12/08/2013  . Hip fracture ([place]) 12/08/2013  . PAD (peripheral artery disease) ([place]) 11/25/2013  . Encounter for therapeutic drug monitoring 11/09/2013  . Atrial fibrillation ([place]) 05/07/2013  . Cough 03/20/2013  . Total knee replacement status 10/07/2012  . Knee  pain 10/07/2012  . Knee stiffness 10/07/2012  . Tachycardia 09/17/2012  . Chest pain 09/17/2012  . Difficulty in walking(719.7) 09/16/2012  . Muscle weakness (generalized) 09/16/2012  . Postop Acute blood loss anemia 08/08/2012  . Instability of prosthetic knee ([place]) 08/06/2012  . Bloating 02/14/2012  . Upper abdominal pain 02/14/2012  . Allergic rhinitis, seasonal 02/15/2011  . COLITIS 02/20/2010  . Diarrhea 01/02/2010  . ABDOMINAL PAIN -GENERALIZED 01/02/2010  . PERSONAL HX COLONIC POLYPS 01/02/2010  . HYPOTHYROIDISM 12/28/2009  . COPD (chronic obstructive pulmonary disease) ([place]) 12/28/2009  . ARTHRITIS 12/28/2009        Geraldine Solar PT, DPT  [place] [address] [place], [place], [address] Phone: [phone]   Fax:  [phone]  Name: [name] MRN: [id number] Date of Birth: 1933-09-08

## 2018-01-07 ENCOUNTER — Other Ambulatory Visit: Payer: Self-pay | Admitting: *Deleted

## 2018-01-07 NOTE — Patient Outreach (Signed)
[facility]) Care Management  01/07/2018  [name] 05-Nov-1932 [id number]   CSW met with patient & her daughter, [name] at patient's home to follow-up since discharge from [facility]. Patient reports that she has been doing well and provided CSW with her completed/notarized [place] to scan into [employer].   CSW will perform a case closure on patient, as all goals of treatment have been met from social work standpoint and no additional social work needs have been identified at this time. CSW will notify patient's RNCM with [facility], Joellyn Quails of CSW's plans to close patient's case.   CSW will fax an update to patient's Primary Care Physician, Dr. Asencion Noble to ensure that they are aware of CSW's involvement with patient's plan of care. CSW will ensure that patient is aware of RNCM with [facility] continued involvement with patient's care.    Raynaldo Opitz, LCSW [facility]  Clinical Social Worker cell #: [phone]

## 2018-01-07 NOTE — Progress Notes (Signed)
Cardiology Office Note    Date:  01/08/2018   ID:  [name], DOB [birth year], MRN [id number]  PCP:  Asencion Noble, MD  Cardiologist: Rozann Lesches, MD    Chief Complaint  Patient presents with  . Follow-up    recurrent palpitations    History of Present Illness:    [name] is a 84 y.o. female with past medical history of chronic diastolic CHF, PAF (on Xarelto), HTN, COPD, and ovarian cancer who presents to the office today for follow-up from a recent Emergency Department visit.   She was last examined by Dr. Domenic Polite on 12/03/2017 following a recent admission for PNA and recurrent atrial fibrillation with RVR. At the time of her visit, she reported significant improvement in her symptoms and denied any recent chest pain or palpitations.  In the interim, she was evaluated at [facility] ED on 12/17/2017 for tachypalpitations.Noted associated chest discomfort and dyspnea with the episode. Labs showed WBC 9.2, Hgb 12.1, platelet count 209, Na+ 135, K+ 3.8, and creatinine 0.83. Initial troponin was negative. She was noted to be in atypical atrial flutter or possible SVT with heart rate in the 160's upon arrival. She was given IV Cardizem while in the ED and converted back to normal sinus rhythm. Was continued on PTA Cardizem CD 120mg  daily and Xarelto for anticoagulation.   She has been evaluated by Oncology in the interim and her platelet count was noted to be 49K, therefore her chemotherapy session was rescheduled. Per review of their notes, this was not a contraindication to remain on anticoagulation and she was continued on Xarelto.  In talking with the patient today, she reports overall doing well until this morning when she developed recurrent palpitations and says heart rate was variable in the 120's-140's.  Noted associated dyspnea at that time and used her supplemental oxygen. Her heart rate improved throughout the day and she reports overall "feeling back to normal" at  this time. Now in NSR by EKG. She denies any recent chest pain, dyspnea on exertion, orthopnea, PND, or lower extremity edema.  She previously experienced epistaxis around the time of her last oncology visit but notes this has improved. Denies any evidence of active bleeding. She is scheduled for repeat labs and chemotherapy next week.  Past Medical History:  Diagnosis Date  . COPD (chronic obstructive pulmonary disease) ([place])   . Depression   . Diverticulosis   . Family hx of colon cancer   . Fibromyalgia   . GERD (gastroesophageal reflux disease)   . Hemorrhoids   . Hiatal hernia   . History of shingles   . Hypothyroidism   . IBS (irritable bowel syndrome)   . Lymphocytic colitis   . Osteoporosis   . Ovarian cancer ([place])   . Paroxysmal atrial fibrillation (HCC)   . Psoriatic arthritis ([place])   . Rectal polyp   . Schatzki's ring   . Uterine polyp     Past Surgical History:  Procedure Laterality Date  . CHOLECYSTECTOMY  Dec 04, 2010  . COLONOSCOPY    . FEMUR IM NAIL Left 12/11/2013   Procedure: INTRAMEDULLARY (IM) NAIL FEMORAL;  Surgeon: [name], MD;  Location: [facility] ORS;  Service: Orthopedics;  Laterality: Left;  . FRACTURE SURGERY Left 12/11/2013   hip  . IR FLUORO GUIDE PORT INSERTION RIGHT  07/22/2017  . IR PARACENTESIS  07/12/2017  . IR US GUIDE VASC ACCESS RIGHT  07/22/2017  . KNEE SURGERY Left    x  2  . TOTAL KNEE REVISION  08/06/2012   Procedure: TOTAL KNEE REVISION;  Surgeon: [name], MD;  Location: [facility] ORS;  Service: Orthopedics;  Laterality: Left;  Left Total Knee Arthroplasty Revision  . Uterine polypectomy      Current Medications: Outpatient Medications Prior to Visit  Medication Sig Dispense Refill  . HYDROcodone-acetaminophen (NORCO) 10-325 MG tablet Take 1 tablet by mouth every 6 (six) hours as needed.    Marland Kitchen albuterol (PROAIR HFA) 108 (90 Base) MCG/ACT inhaler Inhale 2 puffs into the lungs every 6 (six) hours as needed for wheezing or shortness  of breath. 1 Inhaler 3  . ALPRAZolam (XANAX) 0.25 MG tablet Take 1 tablet (0.25 mg total) by mouth at bedtime as needed for anxiety. 5 tablet 0  . azelastine (ASTELIN) 0.1 % nasal spray Place 2 sprays into both nostrils 2 (two) times daily as needed for allergies.     . Cholecalciferol (VITAMIN D) 2000 units CAPS Take 2,000 Units by mouth daily.    . diphenhydrAMINE (BENADRYL) 25 mg capsule Take 25 mg by mouth every 6 (six) hours as needed for itching.    . diphenoxylate-atropine (LOMOTIL) 2.5-0.025 MG tablet Take 1 tablet by mouth 4 (four) times daily. (Patient taking differently: Take 1 tablet by mouth 4 (four) times daily as needed for diarrhea or loose stools. ) 360 tablet 1  . DULoxetine (CYMBALTA) 30 MG capsule Take 1 capsule (30 mg total) by mouth daily. 30 capsule 0  . fluticasone (FLONASE) 50 MCG/ACT nasal spray Place 2 sprays into both nostrils 2 (two) times daily. For nasal congestion    . furosemide (LASIX) 20 MG tablet Take 1 tablet (20 mg total) by mouth daily. 90 tablet 9  . ipratropium-albuterol (DUONEB) 0.5-2.5 (3) MG/3ML SOLN Take 3 mLs by nebulization 3 (three) times daily. (Patient taking differently: Take 3 mLs by nebulization every 6 (six) hours as needed (cough or SOB). As needed) 360 mL   . levothyroxine (SYNTHROID, LEVOTHROID) 137 MCG tablet Take 137 mcg by mouth every morning. For thyroid therapy    . lidocaine-prilocaine (EMLA) cream Apply 1 application topically as needed. 30 g 6  . loperamide (IMODIUM) 2 MG capsule Take 2 capsules (4 mg total) by mouth 3 (three) times daily. 30 capsule 0  . mirtazapine (REMERON) 15 MG tablet Take 1 tablet (15 mg total) by mouth at bedtime. 30 tablet 1  . ondansetron (ZOFRAN) 8 MG tablet Take 1 tablet (8 mg total) by mouth every 8 (eight) hours as needed for nausea or vomiting. (Patient not taking: Reported on 12/27/2017) 60 tablet 0  . polyvinyl alcohol (LIQUIFILM TEARS) 1.4 % ophthalmic solution 1 drop 2 (two) times daily as needed for dry  eyes.    . potassium chloride (K-DUR) 10 MEQ tablet Take 10 mEq by mouth daily.    . prochlorperazine (COMPAZINE) 10 MG tablet Take 1 tablet (10 mg total) by mouth every 6 (six) hours as needed (Nausea or vomiting). 30 tablet 1  . XARELTO 20 MG TABS tablet TAKE 1 TABLET BY MOUTH EVERY DAY WITH SUPPER 90 tablet 0  . diltiazem (CARTIA XT) 120 MG 24 hr capsule TAKE 1 CAPSULE BY MOUTH EVERY DAY 90 capsule 3  . HYDROcodone-acetaminophen (NORCO) 10-325 MG tablet Take 1 tablet by mouth every 6 (six) hours as needed. 60 tablet 0  . HYDROcodone-acetaminophen (NORCO/VICODIN) 5-325 MG tablet Take 2 tablets by mouth every 4 (four) hours as needed for moderate pain or severe pain.    Marland Kitchen  tiotropium (SPIRIVA) 18 MCG inhalation capsule Place 18 mcg into inhaler and inhale daily.    . Tiotropium Bromide-Olodaterol (STIOLTO RESPIMAT IN) Inhale 2 puffs into the lungs daily.     No facility-administered medications prior to visit.      Allergies:   Codeine   Social History   Socioeconomic History  . Marital status: Divorced    Spouse name: Not on file  . Number of children: 1  . Years of education: 44  . Highest education level: GED or equivalent  Occupational History  . Occupation: retired    Fish farm manager: RETIRED  Social Needs  . Financial resource strain: Not hard at all  . Food insecurity:    Worry: Never true    Inability: Never true  . Transportation needs:    Medical: No    Non-medical: No  Tobacco Use  . Smoking status: Former Smoker    Packs/day: 1.00    Years: 20.00    Pack years: 20.00    Types: Cigarettes    Last attempt to quit: 10/08/1980    Years since quitting: 37.2  . Smokeless tobacco: Never Used  Substance and Sexual Activity  . Alcohol use: Yes    Comment: rarely, 12-23-15 rarely  . Drug use: No  . Sexual activity: Not Currently  Lifestyle  . Physical activity:    Days per week: 0 days    Minutes per session: 0 min  . Stress: Not at all  Relationships  . Social  connections:    Talks on phone: More than three times a week    Gets together: Three times a week    Attends religious service: More than 4 times per year    Active member of club or organization: No    Attends meetings of clubs or organizations: Never    Relationship status: Divorced  Other Topics Concern  . Not on file  Social History Narrative   Patient lives at home by herself.    Patient is retired.    Patient has 12 th grade education.            Family History:  The patient's family history includes Colon cancer in her maternal grandfather and mother; Depression in her sister; Heart disease in her father; Lung cancer in her father.   Review of Systems:   Please see the history of present illness.     General:  No chills, fever, night sweats or weight changes.  Cardiovascular:  No chest pain, dyspnea on exertion, edema, orthopnea,  paroxysmal nocturnal dyspnea. Positive for palpitations.  Dermatological: No rash, lesions/masses Respiratory: No cough, dyspnea Urologic: No hematuria, dysuria Abdominal:   No nausea, vomiting, diarrhea, bright red blood per rectum, melena, or hematemesis Neurologic:  No visual changes, wkns, changes in mental status. All other systems reviewed and are otherwise negative except as noted above.   Physical Exam:    VS:  BP 138/66   Pulse 93   Ht 5\' 4"  (1.626 m)   Wt 161 lb (73 kg)   SpO2 92%   BMI 27.64 kg/m    General: Well developed, elderly Caucasian female appearing in no acute distress. Head: Normocephalic, atraumatic, sclera non-icteric, no xanthomas, nares are without discharge.  Neck: No carotid bruits. JVD not elevated.  Lungs: Respirations regular and unlabored, without wheezes or rales.  Heart: Regular rate and rhythm. No S3 or S4.  No murmur, no rubs, or gallops appreciated. Abdomen: Soft, non-tender, non-distended with normoactive bowel sounds. No hepatomegaly. No  rebound/guarding. No obvious abdominal masses. Msk:  Strength  and tone appear normal for age. No joint deformities or effusions. Extremities: No clubbing or cyanosis. No lower extremity edema.  Distal pedal pulses are 2+ bilaterally. Neuro: Alert and oriented X 3. Moves all extremities spontaneously. No focal deficits noted. Psych:  Responds to questions appropriately with a normal affect. Skin: No rashes or lesions noted  Wt Readings from Last 3 Encounters:  01/08/18 161 lb (73 kg)  01/03/18 162 lb 3.2 oz (73.6 kg)  12/13/17 162 lb 3.2 oz (73.6 kg)     Studies/Labs Reviewed:   EKG:  EKG is ordered today.  The ekg ordered today demonstrates normal sinus rhythm, HR 91, with no acute ST or T wave changes.  Recent Labs: 08/12/2017: NT-Pro BNP 837 11/10/2017: TSH 0.388 11/12/2017: B Natriuretic Peptide 76.7 11/15/2017: Magnesium 1.3 12/17/2017: Hemoglobin 12.1 01/03/2018: ALT 10; BUN 12; Creatinine 0.90; Platelet Count 49; Potassium 3.7; Sodium 135   Lipid Panel    Component Value Date/Time   CHOL 147 11/14/2017 1413    Additional studies/ records that were reviewed today include:   Echocardiogram: 08/2017 Study Conclusions  - Left ventricle: The cavity size was normal. Wall thickness was   normal. Systolic function was normal. The estimated ejection   fraction was in the range of 60% to 65%. Wall motion was normal;   there were no regional wall motion abnormalities. Doppler   parameters are consistent with abnormal left ventricular   relaxation (grade 1 diastolic dysfunction). - Aortic valve: There was no stenosis. There was trivial   regurgitation. - Mitral valve: Mildly calcified annulus. There was no significant   regurgitation. - Right ventricle: The cavity size was normal. Systolic function   was normal. - Tricuspid valve: Peak RV-RA gradient (S): 27 mm Hg. - Pulmonary arteries: PA peak pressure: 30 mm Hg (S). - Inferior vena cava: The vessel was normal in size. The   respirophasic diameter changes were in the normal range (= 50%),    consistent with normal central venous pressure.  Impressions:  - Normal LV size with EF 60-65%. Normal RV size and systolic   function. No significant valvular abnormalities.  Assessment:    1. Paroxysmal atrial fibrillation (HCC)   2. Chronic diastolic heart failure ([place])   3. Essential hypertension   4. Malignant neoplasm of ovary, unspecified laterality ([place])      Plan:   In order of problems listed above:  1. Paroxysmal Atrial Fibrillation - The patient has a known history of paroxysmal atrial fibrillation and was evaluated in the ED for this last month and converted to normal sinus rhythm following administration of Cardizem. EKG at that time was thought to be most consistent with atypical atrial flutter versus SVT. - She did experience recurrent palpitations this morning with HR in the 120's-140's. This has since improved and is currently in the 90's with EKG showing she is back in normal sinus rhythm.  - I spent a significant amount of time discussing with the patient and her daughter options regarding rate versus rhythm control with antiarrhythmic therapy. Recommended initially trying a rate-control strategy given her multiple medical conditions and they were in agreement with this. She currently takes Cardizem CD 120 mg daily. Will increase to 120 mg twice daily (divided dosing to help lessen side-effects with increased dose). If she tolerates this well, can consolidate to 240 mg once daily. Can continue to titrate dosing as BP allows. If she continues to have break-through episodes, may  need to consider the use of Amiodarone but would try to avoid for now given her known COPD.  - continue Xarelto for anticoagulation. Platelet count closely being followed by Hematology.   2. Chronic Diastolic CHF - She denies any recent orthopnea, PND, or lower extremity edema. Did experience dyspnea earlier this morning around the time of her elevated heart rate. Now back to baseline. -  Continue Lasix 20 mg daily.  3. HTN - BP is well controlled at 138/66 during today's visit. - Will continue Cardizem CD with titration of dosing as outlined above.  4. Ovarian Cancer/ Thrombocytopenia - followed by Oncology. Platelet count was significantly low at 49K following her last chemotherapy treatment. She denies any evidence of active bleeding. Scheduled for repeat labs and chemotherapy next week.    Medication Adjustments/Labs and Tests Ordered: Current medicines are reviewed at length with the patient today.  Concerns regarding medicines are outlined above.  Medication changes, Labs and Tests ordered today are listed in the Patient Instructions below. Patient Instructions  Medication Instructions:  Increase cardizem to 120 mg two times daily   Labwork: none  Testing/Procedures: none  Follow-Up: Your physician recommends that you schedule a follow-up appointment in: 6 weeks   Any Other Special Instructions Will Be Listed Below (If Applicable).  If you need a refill on your cardiac medications before your next appointment, please call your pharmacy.    Signed, Erma Heritage, PA-C  01/08/2018 4:52 PM    [facility] [address] [place],  [address] Phone: [phone]

## 2018-01-08 ENCOUNTER — Encounter (HOSPITAL_COMMUNITY): Payer: Self-pay | Admitting: Physical Therapy

## 2018-01-08 ENCOUNTER — Encounter: Payer: Self-pay | Admitting: Student

## 2018-01-08 ENCOUNTER — Telehealth (HOSPITAL_COMMUNITY): Payer: Self-pay | Admitting: Physical Therapy

## 2018-01-08 ENCOUNTER — Ambulatory Visit (INDEPENDENT_AMBULATORY_CARE_PROVIDER_SITE_OTHER): Payer: Medicare Other | Admitting: Student

## 2018-01-08 ENCOUNTER — Encounter (HOSPITAL_COMMUNITY): Payer: Self-pay | Admitting: Occupational Therapy

## 2018-01-08 VITALS — BP 138/66 | HR 93 | Ht 64.0 in | Wt 161.0 lb

## 2018-01-08 DIAGNOSIS — I10 Essential (primary) hypertension: Secondary | ICD-10-CM

## 2018-01-08 DIAGNOSIS — I5032 Chronic diastolic (congestive) heart failure: Secondary | ICD-10-CM

## 2018-01-08 DIAGNOSIS — I480 Paroxysmal atrial fibrillation: Secondary | ICD-10-CM | POA: Diagnosis not present

## 2018-01-08 DIAGNOSIS — C569 Malignant neoplasm of unspecified ovary: Secondary | ICD-10-CM | POA: Diagnosis not present

## 2018-01-08 MED ORDER — DILTIAZEM HCL ER COATED BEADS 120 MG PO CP24: 120.0000 mg | ORAL_CAPSULE | Freq: Two times a day (BID) | ORAL | 3 refills | Status: DC

## 2018-01-08 NOTE — Patient Instructions (Signed)
Medication Instructions:  Increase cardizem to 120 mg two times daily   Labwork: none  Testing/Procedures: none  Follow-Up: Your physician recommends that you schedule a follow-up appointment in: 6 weeks    Any Other Special Instructions Will Be Listed Below (If Applicable).     If you need a refill on your cardiac medications before your next appointment, please call your pharmacy.

## 2018-01-08 NOTE — Telephone Encounter (Signed)
Patient is having some complications this morning and will not be coming to rehab today she is going to see her Cardiologist

## 2018-01-10 ENCOUNTER — Encounter (HOSPITAL_COMMUNITY): Payer: Self-pay | Admitting: Occupational Therapy

## 2018-01-10 ENCOUNTER — Encounter (HOSPITAL_COMMUNITY): Payer: Self-pay

## 2018-01-10 ENCOUNTER — Ambulatory Visit (HOSPITAL_COMMUNITY): Payer: Medicare Other

## 2018-01-10 DIAGNOSIS — R2689 Other abnormalities of gait and mobility: Secondary | ICD-10-CM

## 2018-01-10 DIAGNOSIS — M6281 Muscle weakness (generalized): Secondary | ICD-10-CM

## 2018-01-10 DIAGNOSIS — M79662 Pain in left lower leg: Secondary | ICD-10-CM

## 2018-01-10 NOTE — Patient Instructions (Signed)
Access Code: [email or web address]  URL: [email or web address]  Date: 01/10/2018  Prepared by: Geraldine Solar   Exercises  Standing Hip Abduction - 10 reps - 3 sets - 1x daily - 7x weekly  Seated Long Arc Quad - 10 reps - 3 sets - 1x daily - 7x weekly  Standing Knee Flexion - 10 reps - 3 sets - 1x daily - 7x weekly  Standing Marching - 10 reps - 3 sets - 1x daily - 7x weekly

## 2018-01-10 NOTE — Therapy (Signed)
[place], [place], [address] Phone: [phone]   Fax:  [phone]  Physical Therapy Treatment  Patient Details  Name: [name] MRN: [id number] Date of Birth: 09-Mar-1933 Referring Provider: Dr. Heath Lark   Encounter Date: 01/10/2018  PT End of Session - 01/10/18 1036    Visit Number  7    Number of Visits  18    Date for PT Re-Evaluation  02/03/18 mini reassess on 01/13/18    Authorization Type  Medicare    Authorization Time Period  12/23/17 to 02/03/18    PT Start Time  1033    PT Stop Time  1118    PT Time Calculation (min)  45 min    Equipment Utilized During Treatment  Oxygen    Activity Tolerance  Patient tolerated treatment well;Patient limited by fatigue    Behavior During Therapy  [facility] for tasks assessed/performed       Past Medical History:  Diagnosis Date  . COPD (chronic obstructive pulmonary disease) ([place])   . Depression   . Diverticulosis   . Family hx of colon cancer   . Fibromyalgia   . GERD (gastroesophageal reflux disease)   . Hemorrhoids   . Hiatal hernia   . History of shingles   . Hypothyroidism   . IBS (irritable bowel syndrome)   . Lymphocytic colitis   . Osteoporosis   . Ovarian cancer ([place])   . Paroxysmal atrial fibrillation (HCC)   . Psoriatic arthritis ([place])   . Rectal polyp   . Schatzki's ring   . Uterine polyp     Past Surgical History:  Procedure Laterality Date  . CHOLECYSTECTOMY  Dec 04, 2010  . COLONOSCOPY    . FEMUR IM NAIL Left 12/11/2013   Procedure: INTRAMEDULLARY (IM) NAIL FEMORAL;  Surgeon: [name], MD;  Location: [facility];  Service: Orthopedics;  Laterality: Left;  . FRACTURE SURGERY Left 12/11/2013   hip  . IR FLUORO GUIDE PORT INSERTION RIGHT  07/22/2017  . IR PARACENTESIS  07/12/2017  . IR US GUIDE VASC ACCESS RIGHT  07/22/2017  . KNEE SURGERY Left    x 2  . TOTAL KNEE REVISION  08/06/2012   Procedure: TOTAL KNEE REVISION;  Surgeon:  [name], MD;  Location: [facility];  Service: Orthopedics;  Laterality: Left;  Left Total Knee Arthroplasty Revision  . Uterine polypectomy      There were no vitals filed for this visit.  Subjective Assessment - 01/10/18 1036    Subjective  Pt states that her heart medication was doubled on Monday. Her leg is still giving her some pain but she still thinks that's her psoriatic arthritis.     Limitations  Walking;Standing    How long can you sit comfortably?  not limited    How long can you stand comfortably?  a few mins    How long can you walk comfortably?  89f or > at the nursing home    Patient Stated Goals  Would like to come to clinic to build up her strength and endurance    Currently in Pain?  No/denies    Pain Onset  In the past 7 days            [facility] - [facility] 0001      Knee/Hip Exercises: Aerobic   Nustep  5 mins, L1, seat 8; HIIT (10" fast, 50" normal)      Knee/Hip Exercises:  Standing   Heel Raises  Both;15 reps    Heel Raises Limitations  heel and toe    Knee Flexion  Both;10 reps;Limitations    Knee Flexion Limitations  3#    Hip Abduction  Both;15 reps    Abduction Limitations  RTB, BUE support    Lateral Step Up  Both;10 reps;Hand Hold: 2;Step Height: 4"    Forward Step Up  Both;10 reps;Hand Hold: 2;Step Height: 4"    Other Standing Knee Exercises  sidestepping in // bars x3RT with RTB      Knee/Hip Exercises: Seated   Long Arc Quad  Both;15 reps    Long Arc Quad Limitations  3#    Marching  Both;15 reps    Marching Limitations  3# BLE             PT Education - 01/10/18 1117    Education provided  Yes    Education Details  exercise technique, updated HEP    Person(s) Educated  Patient    Methods  Explanation;Demonstration;Handout    Comprehension  Verbalized understanding;Returned demonstration       PT Short Term Goals - 12/23/17 1350      PT SHORT TERM GOAL #1   Title  Patient will ambulated >250  feet with rolling walker during the 3 MWT to improve her ability to ambulate further within her home before needing to rest.    Baseline  3/18: 26f with RW    Time  3    Period  Weeks    Status  On-going    Target Date  01/13/18      PT SHORT TERM GOAL #2   Title  Patient with tolerate the standing position with upper extremity support for 5 minutes before needing to rest to improve her ability to perform standing functional activities and ADLs within her home environment.    Baseline  3/18: able to stand 5 mins with no UE support    Time  3    Period  Weeks    Status  Achieved      PT SHORT TERM GOAL #3   Title  Patient will increase MMT grades by 1/2 grade to show improvement in her strength and stability to perform functional activities.    Baseline  3/18: see MMT    Time  3    Period  Weeks    Status  Partially Met      PT SHORT TERM GOAL #4   Title  Patient will be able to stand on either foot for 10 seconds to exhibit improved balance and decreased risk for falls.    Baseline  3/18: R: 7 sec with bil HHA, L: 10 sec with bil HHA    Time  3    Period  Weeks    Status  On-going        PT Long Term Goals - 12/23/17 1351      PT LONG TERM GOAL #1   Title  Patient will ambulated >500 feet with rolling walker during the 3 MWT to improve her ability to ambulate further  in community to allow to her go enjoy going out for a meal.    Baseline  3/18: 23fwith RA    Time  6    Period  Weeks    Status  On-going    Target Date  02/03/18      PT LONG TERM GOAL #2   Title  Patient with tolerate the standing  position with upper extremity support for 10 minutes before needing to rest to improve her ability to stand and have a conversation with a friend or relative.    Baseline  3/18: able to stand 5 mins with no UE support    Time  6    Period  Weeks    Status  On-going      PT LONG TERM GOAL #3   Title  Patient will increase MMT grades by 1 grade to show continued improvement  in her strength and stability to perform functional activities at home independently.    Baseline  3/18: see MMT    Time  6    Period  Weeks    Status  Partially Met      PT LONG TERM GOAL #4   Title  Patient will be able to stand on either foot for 15 seconds to exhibit improved balance and further decreased risk for falls.    Baseline  3/18: R: 7 sec bil HHA, L: 10 sec bil HHA    Time  6    Period  Weeks    Status  On-going      PT LONG TERM GOAL #5   Title  Pt will be able to perform 5xSTS in 12 sec or < with no UE in order to demo improved functional BLE strength and overall balance.    Time  6    Period  Weeks    Status  New      Additional Long Term Goals   Additional Long Term Goals  Yes      PT LONG TERM GOAL #6   Title  Pt will report requiring assistance for ADLs and IADLs at home 25% of the time or < to demonstrate improved overall functional strength, endurance, and balance in order to promote independence at home.    Time  6    Period  Weeks    Status  New    Target Date  02/03/18            Plan - 01/10/18 1123    Clinical Impression Statement  Continued with established POC focusing on general and functional strengthening. Min cues for technique required. HR monitored during session and remained below 130bpm; would steadily decrease back to WNL with seated rest break. Ended with HIIT on Nustep again this date. Updated strengthening HEP this date. Continue as planned, progressing as able and initiate balance activities. Pt due for reassessment next visit.    Rehab Potential  Good    PT Frequency  3x / week    PT Duration  6 weeks    PT Treatment/Interventions  ADLs/Self Care Home Management;Gait training;Stair training;Functional mobility training;Therapeutic activities;Patient/family education;Balance training;Neuromuscular re-education;Therapeutic exercise;Manual techniques;Energy conservation    PT Next Visit Plan  reassessment; Allow adequate rest breaks  for patient, monitor HR; continue stregnthening and endurance activities for hip/glutes strength; continue HIIT on Nustep    PT Home Exercise Plan  intial - lap walking at home; supine bridges; 3/18: supine clams with RTB; 3/25: SAQ; 4/5: standing abd, marching, LAQ, knee flexion    Consulted and Agree with Plan of Care  Patient       Patient will benefit from skilled therapeutic intervention in order to improve the following deficits and impairments:  Abnormal gait, Pain, Decreased mobility, Decreased coordination, Decreased activity tolerance, Decreased endurance, Decreased strength, Decreased balance, Difficulty walking  Visit Diagnosis: Muscle weakness (generalized)  Pain in left lower leg  Other abnormalities  of gait and mobility     Problem List Patient Active Problem List   Diagnosis Date Noted  . Thrombocytopenia ([place]) 01/03/2018  . Anemia, chronic disease 12/13/2017  . Mild protein-calorie malnutrition ([place]) 12/13/2017  . Peripheral neuropathy due to chemotherapy ([place]) 12/13/2017  . S/P thoracentesis   . Atrial fibrillation with RVR ([place]) 11/08/2017  . HCAP (healthcare-associated pneumonia) 11/08/2017  . Acute on chronic diastolic heart failure ([place]) 08/19/2017  . Pancytopenia, acquired ([place]) 08/15/2017  . Protein-calorie malnutrition, moderate ([place]) 07/30/2017  . Generalized weakness 07/30/2017  . Antineoplastic chemotherapy induced pancytopenia ([place]) 07/20/2017  . Ovarian CA, right ([place]) 07/18/2017  . PNA (pneumonia) 07/07/2017  . Ascites 06/30/2017  . Ascites, malignant 06/30/2017  . Right ovarian cyst 01/18/2017  . Elevated CA-125 01/18/2017  . Depression 12/23/2015  . Peripheral edema 08/23/2015  . Fracture of hip, left, closed ([place]) 12/08/2013  . Hip fracture ([place]) 12/08/2013  . PAD (peripheral artery disease) ([place]) 11/25/2013  . Encounter for therapeutic drug monitoring 11/09/2013  . Atrial fibrillation ([place]) 05/07/2013  . Cough 03/20/2013  . Total knee  replacement status 10/07/2012  . Knee pain 10/07/2012  . Knee stiffness 10/07/2012  . Tachycardia 09/17/2012  . Chest pain 09/17/2012  . Difficulty in walking(719.7) 09/16/2012  . Muscle weakness (generalized) 09/16/2012  . Postop Acute blood loss anemia 08/08/2012  . Instability of prosthetic knee ([place]) 08/06/2012  . Bloating 02/14/2012  . Upper abdominal pain 02/14/2012  . Allergic rhinitis, seasonal 02/15/2011  . COLITIS 02/20/2010  . Diarrhea 01/02/2010  . ABDOMINAL PAIN -GENERALIZED 01/02/2010  . PERSONAL HX COLONIC POLYPS 01/02/2010  . HYPOTHYROIDISM 12/28/2009  . COPD (chronic obstructive pulmonary disease) ([place]) 12/28/2009  . ARTHRITIS 12/28/2009       Geraldine Solar PT, DPT  [facility] [address] [place], [place], [address] Phone: [phone]   Fax:  [phone]  Name: [name] MRN: [id number] Date of Birth: Jun 26, 1933

## 2018-01-13 ENCOUNTER — Ambulatory Visit (HOSPITAL_COMMUNITY): Payer: Medicare Other

## 2018-01-13 ENCOUNTER — Encounter (HOSPITAL_COMMUNITY): Payer: Self-pay | Admitting: Specialist

## 2018-01-13 DIAGNOSIS — R2689 Other abnormalities of gait and mobility: Secondary | ICD-10-CM

## 2018-01-13 DIAGNOSIS — M6281 Muscle weakness (generalized): Secondary | ICD-10-CM

## 2018-01-13 DIAGNOSIS — M79662 Pain in left lower leg: Secondary | ICD-10-CM

## 2018-01-13 NOTE — Therapy (Signed)
[place], [place], [address] Phone: [phone]   Fax:  [phone]  Physical Therapy Treatment  Patient Details  Name: [name] MRN: [id number] Date of Birth: 12-17-1932 Referring Provider: Dr. Heath Lark   Encounter Date: 01/13/2018  PT End of Session - 01/13/18 1030    Visit Number  8    Number of Visits  18    Date for PT Re-Evaluation  02/03/18 mini reassess on 01/13/18    Authorization Type  Medicare    Authorization Time Period  12/23/17 to 02/03/18    PT Start Time  1029    PT Stop Time  1111    PT Time Calculation (min)  42 min    Equipment Utilized During Treatment  Oxygen    Activity Tolerance  Patient tolerated treatment well;Patient limited by fatigue    Behavior During Therapy  [facility] for tasks assessed/performed       Past Medical History:  Diagnosis Date  . COPD (chronic obstructive pulmonary disease) ([place])   . Depression   . Diverticulosis   . Family hx of colon cancer   . Fibromyalgia   . GERD (gastroesophageal reflux disease)   . Hemorrhoids   . Hiatal hernia   . History of shingles   . Hypothyroidism   . IBS (irritable bowel syndrome)   . Lymphocytic colitis   . Osteoporosis   . Ovarian cancer ([place])   . Paroxysmal atrial fibrillation (H[facility])   . Psoriatic arthritis ([place])   . Rectal polyp   . Schatzki's ring   . Uterine polyp     Past Surgical History:  Procedure Laterality Date  . CHOLECYSTECTOMY  Dec 04, 2010  . COLONOSCOPY    . FEMUR IM NAIL Left 12/11/2013   Procedure: INTRAMEDULLARY (IM) NAIL FEMORAL;  Surgeon: [name], MD;  [facility];  Service: Orthopedics;  Laterality: Left;  . FRACTURE SURGERY Left 12/11/2013   hip  . IR FLUORO GUIDE PORT INSERTION RIGHT  07/22/2017  . IR PARACENTESIS  07/12/2017  . IR US GUIDE VASC ACCESS RIGHT  07/22/2017  . KNEE SURGERY Left    x 2  . TOTAL KNEE REVISION  08/06/2012   Procedure: TOTAL KNEE REVISION;  Surgeon:  [name], MD;  [facility];  Service: Orthopedics;  Laterality: Left;  Left Total Knee Arthroplasty Revision  . Uterine polypectomy      There were no vitals filed for this visit.  Subjective Assessment - 01/13/18 1034    Subjective  Pt reports that she is not feeling good today. She said that her legs are really bothering her today. She states it's more joint pain than muscle pain.     Limitations  Walking;Standing    How long can you sit comfortably?  not limited    How long can you stand comfortably?  a few mins    How long can you walk comfortably?  89f or > at the nursing home    Patient Stated Goals  Would like to come to clinic to build up her strength and endurance    Currently in Pain?  Yes    Pain Score  6     Pain Location  Buttocks    Pain Orientation  Right;Left    Pain Descriptors / Indicators  -- "it just hurts"    Pain Type  Chronic pain    Pain Onset  In the past 7 days  Pain Frequency  Constant    Aggravating Factors   nothing    Pain Relieving Factors  activity    Effect of Pain on Daily Activities  decreased ability to complete ADLs             [facility] Adult PT Treatment/Exercise - 01/13/18 0001      Knee/Hip Exercises: Standing   Hip Flexion  Both;10 reps;Limitations    Hip Flexion Limitations  marching on foam, 1UE support    Lateral Step Up  Both;10 reps;Hand Hold: 2;Step Height: 4"    Forward Step Up  Both;10 reps;Hand Hold: 2;Step Height: 4"    Other Standing Knee Exercises  tandem stance firm 5x10" holds each    Other Standing Knee Exercises  cone taps on foam, 1UE support x5RT      Knee/Hip Exercises: Seated   Long Arc Quad  Both;15 reps    Long Arc Quad Limitations  4#    Sit to [employer]  10 reps;without UE support           PT Education - 01/13/18 1030    Education provided  Yes    Education Details  exercise technique    Person(s) Educated  Patient    Methods  Explanation;Demonstration    Comprehension  Verbalized  understanding;Returned demonstration       PT Short Term Goals - 12/23/17 1350      PT SHORT TERM GOAL #1   Title  Patient will ambulated >250 feet with rolling walker during the 3 MWT to improve her ability to ambulate further within her home before needing to rest.    Baseline  3/18: 89f with RW    Time  3    Period  Weeks    Status  On-going    Target Date  01/13/18      PT SHORT TERM GOAL #2   Title  Patient with tolerate the standing position with upper extremity support for 5 minutes before needing to rest to improve her ability to perform standing functional activities and ADLs within her home environment.    Baseline  3/18: able to stand 5 mins with no UE support    Time  3    Period  Weeks    Status  Achieved      PT SHORT TERM GOAL #3   Title  Patient will increase MMT grades by 1/2 grade to show improvement in her strength and stability to perform functional activities.    Baseline  3/18: see MMT    Time  3    Period  Weeks    Status  Partially Met      PT SHORT TERM GOAL #4   Title  Patient will be able to stand on either foot for 10 seconds to exhibit improved balance and decreased risk for falls.    Baseline  3/18: R: 7 sec with bil HHA, L: 10 sec with bil HHA    Time  3    Period  Weeks    Status  On-going        PT Long Term Goals - 12/23/17 1351      PT LONG TERM GOAL #1   Title  Patient will ambulated >500 feet with rolling walker during the 3 MWT to improve her ability to ambulate further  in community to allow to her go enjoy going out for a meal.    Baseline  3/18: 89fwith RA    Time  6  Period  Weeks    Status  On-going    Target Date  02/03/18      PT LONG TERM GOAL #2   Title  Patient with tolerate the standing position with upper extremity support for 10 minutes before needing to rest to improve her ability to stand and have a conversation with a friend or relative.    Baseline  3/18: able to stand 5 mins with no UE support    Time  6     Period  Weeks    Status  On-going      PT LONG TERM GOAL #3   Title  Patient will increase MMT grades by 1 grade to show continued improvement in her strength and stability to perform functional activities at home independently.    Baseline  3/18: see MMT    Time  6    Period  Weeks    Status  Partially Met      PT LONG TERM GOAL #4   Title  Patient will be able to stand on either foot for 15 seconds to exhibit improved balance and further decreased risk for falls.    Baseline  3/18: R: 7 sec bil HHA, L: 10 sec bil HHA    Time  6    Period  Weeks    Status  On-going      PT LONG TERM GOAL #5   Title  Pt will be able to perform 5xSTS in 12 sec or < with no UE in order to demo improved functional BLE strength and overall balance.    Time  6    Period  Weeks    Status  New      Additional Long Term Goals   Additional Long Term Goals  Yes      PT LONG TERM GOAL #6   Title  Pt will report requiring assistance for ADLs and IADLs at home 25% of the time or < to demonstrate improved overall functional strength, endurance, and balance in order to promote independence at home.    Time  6    Period  Weeks    Status  New    Target Date  02/03/18            Plan - 01/13/18 1113    Clinical Impression Statement  Pt presented to therapy stating that her legs are hurting her today; when asked if it feels more muscular or joint related she stated it felt more joint and related her pain to her psoriatic arthritis. Limited today's session to pt's tolerance. After performing strengthening at beginning of session, switch to more balance activities. Pt challenged with NBOS activities. She required a few rest breaks during session due to fatigue; HR stayed below 127bpm this date. Continue as planned, progressing as able. Held Nustep this date due to fatigue; can resume PRN in future sessions. Will perform reassessment next visit.    Rehab Potential  Good    PT Frequency  3x / week    PT  Duration  6 weeks    PT Treatment/Interventions  ADLs/Self Care Home Management;Gait training;Stair training;Functional mobility training;Therapeutic activities;Patient/family education;Balance training;Neuromuscular re-education;Therapeutic exercise;Manual techniques;Energy conservation    PT Next Visit Plan  perform reassessment; Allow adequate rest breaks for patient, monitor HR; continue stregnthening and endurance activities for hip/glutes strength; continue HIIT on Nustep    PT Home Exercise Plan  intial - lap walking at home; supine bridges; 3/18: supine clams with RTB; 3/25: SAQ; 4/5:  standing abd, marching, LAQ, knee flexion    Consulted and Agree with Plan of Care  Patient       Patient will benefit from skilled therapeutic intervention in order to improve the following deficits and impairments:  Abnormal gait, Pain, Decreased mobility, Decreased coordination, Decreased activity tolerance, Decreased endurance, Decreased strength, Decreased balance, Difficulty walking  Visit Diagnosis: Muscle weakness (generalized)  Pain in left lower leg  Other abnormalities of gait and mobility     Problem List Patient Active Problem List   Diagnosis Date Noted  . Thrombocytopenia ([place]) 01/03/2018  . Anemia, chronic disease 12/13/2017  . Mild protein-calorie malnutrition ([place]) 12/13/2017  . Peripheral neuropathy due to chemotherapy ([place]) 12/13/2017  . S/P thoracentesis   . Atrial fibrillation with RVR ([place]) 11/08/2017  . HCAP (healthcare-associated pneumonia) 11/08/2017  . Acute on chronic diastolic heart failure ([place]) 08/19/2017  . Pancytopenia, acquired ([place]) 08/15/2017  . Protein-calorie malnutrition, moderate ([place]) 07/30/2017  . Generalized weakness 07/30/2017  . Antineoplastic chemotherapy induced pancytopenia ([place]) 07/20/2017  . Ovarian CA, right ([place]) 07/18/2017  . PNA (pneumonia) 07/07/2017  . Ascites 06/30/2017  . Ascites, malignant 06/30/2017  . Right ovarian cyst  01/18/2017  . Elevated CA-125 01/18/2017  . Depression 12/23/2015  . Peripheral edema 08/23/2015  . Fracture of hip, left, closed ([place]) 12/08/2013  . Hip fracture ([place]) 12/08/2013  . PAD (peripheral artery disease) ([place]) 11/25/2013  . Encounter for therapeutic drug monitoring 11/09/2013  . Atrial fibrillation ([place]) 05/07/2013  . Cough 03/20/2013  . Total knee replacement status 10/07/2012  . Knee pain 10/07/2012  . Knee stiffness 10/07/2012  . Tachycardia 09/17/2012  . Chest pain 09/17/2012  . Difficulty in walking(719.7) 09/16/2012  . Muscle weakness (generalized) 09/16/2012  . Postop Acute blood loss anemia 08/08/2012  . Instability of prosthetic knee ([place]) 08/06/2012  . Bloating 02/14/2012  . Upper abdominal pain 02/14/2012  . Allergic rhinitis, seasonal 02/15/2011  . COLITIS 02/20/2010  . Diarrhea 01/02/2010  . ABDOMINAL PAIN -GENERALIZED 01/02/2010  . PERSONAL HX COLONIC POLYPS 01/02/2010  . HYPOTHYROIDISM 12/28/2009  . COPD (chronic obstructive pulmonary disease) ([place]) 12/28/2009  . ARTHRITIS 12/28/2009       Geraldine Solar PT, DPT  [facility] [address] [place], [place], [address] Phone: [phone]   Fax:  [phone]  Name: [name] MRN: [id number] Date of Birth: [birth year]

## 2018-01-14 ENCOUNTER — Inpatient Hospital Stay: Payer: Medicare Other

## 2018-01-14 ENCOUNTER — Inpatient Hospital Stay: Payer: Medicare Other | Attending: Hematology and Oncology

## 2018-01-14 DIAGNOSIS — Z9221 Personal history of antineoplastic chemotherapy: Secondary | ICD-10-CM | POA: Insufficient documentation

## 2018-01-14 DIAGNOSIS — D61818 Other pancytopenia: Secondary | ICD-10-CM | POA: Insufficient documentation

## 2018-01-14 DIAGNOSIS — C561 Malignant neoplasm of right ovary: Secondary | ICD-10-CM

## 2018-01-14 DIAGNOSIS — Z5111 Encounter for antineoplastic chemotherapy: Secondary | ICD-10-CM | POA: Insufficient documentation

## 2018-01-14 DIAGNOSIS — R531 Weakness: Secondary | ICD-10-CM | POA: Insufficient documentation

## 2018-01-14 DIAGNOSIS — Z79899 Other long term (current) drug therapy: Secondary | ICD-10-CM | POA: Insufficient documentation

## 2018-01-14 DIAGNOSIS — J449 Chronic obstructive pulmonary disease, unspecified: Secondary | ICD-10-CM | POA: Diagnosis not present

## 2018-01-14 LAB — CBC WITH DIFFERENTIAL (CANCER CENTER ONLY)
BASOS PCT: 0 %
Basophils Absolute: 0 10*3/uL (ref 0.0–0.1)
EOS ABS: 0.1 10*3/uL (ref 0.0–0.5)
Eosinophils Relative: 1 %
HCT: 33.3 % — ABNORMAL LOW (ref 34.8–46.6)
Hemoglobin: 11.1 g/dL — ABNORMAL LOW (ref 11.6–15.9)
Lymphocytes Relative: 23 %
Lymphs Abs: 1.3 10*3/uL (ref 0.9–3.3)
MCH: 33.1 pg (ref 25.1–34.0)
MCHC: 33.4 g/dL (ref 31.5–36.0)
MCV: 98.9 fL (ref 79.5–101.0)
MONO ABS: 0.6 10*3/uL (ref 0.1–0.9)
MONOS PCT: 11 %
Neutro Abs: 3.5 10*3/uL (ref 1.5–6.5)
Neutrophils Relative %: 65 %
Platelet Count: 187 10*3/uL (ref 145–400)
RBC: 3.37 MIL/uL — ABNORMAL LOW (ref 3.70–5.45)
RDW: 14.6 % — AB (ref 11.2–14.5)
WBC Count: 5.5 10*3/uL (ref 3.9–10.3)

## 2018-01-14 LAB — CMP (CANCER CENTER ONLY)
ALK PHOS: 56 U/L (ref 40–150)
ALT: 12 U/L (ref 0–55)
AST: 16 U/L (ref 5–34)
Albumin: 3.3 g/dL — ABNORMAL LOW (ref 3.5–5.0)
Anion gap: 9 (ref 3–11)
BUN: 12 mg/dL (ref 7–26)
CALCIUM: 8.9 mg/dL (ref 8.4–10.4)
CO2: 26 mmol/L (ref 22–29)
CREATININE: 0.85 mg/dL (ref 0.60–1.10)
Chloride: 102 mmol/L (ref 98–109)
GFR, Est AFR Am: >60 mL/min (ref >60)
GFR, Estimated: >60 mL/min (ref >60)
GLUCOSE: 117 mg/dL (ref 70–140)
Potassium: 3.7 mmol/L (ref 3.5–5.1)
SODIUM: 137 mmol/L (ref 136–145)
Total Bilirubin: 0.3 mg/dL (ref 0.2–1.2)
Total Protein: 6.8 g/dL (ref 6.4–8.3)

## 2018-01-14 MED ORDER — DIPHENHYDRAMINE HCL 25 MG PO TABS
25.0000 mg | ORAL_TABLET | Freq: Once | ORAL | Status: AC
  Administered 2018-01-14: 25 mg via ORAL
  Filled 2018-01-14: qty 1

## 2018-01-14 MED ORDER — FAMOTIDINE 20 MG PO TABS
ORAL_TABLET | ORAL | Status: AC
  Filled 2018-01-14: qty 1

## 2018-01-14 MED ORDER — SODIUM CHLORIDE 0.9 % IV SOLN
Freq: Once | INTRAVENOUS | Status: AC
  Administered 2018-01-14: 15:00:00 via INTRAVENOUS
  Filled 2018-01-14: qty 5

## 2018-01-14 MED ORDER — HEPARIN SOD (PORK) LOCK FLUSH 100 UNIT/ML IV SOLN
500.0000 [IU] | Freq: Once | INTRAVENOUS | Status: AC | PRN
  Administered 2018-01-14: 500 [IU]
  Filled 2018-01-14: qty 5

## 2018-01-14 MED ORDER — FAMOTIDINE 20 MG PO TABS
20.0000 mg | ORAL_TABLET | Freq: Once | ORAL | Status: AC
  Administered 2018-01-14: 20 mg via ORAL

## 2018-01-14 MED ORDER — CARBOPLATIN CHEMO INJECTION 450 MG/45ML
370.5000 mg | Freq: Once | INTRAVENOUS | Status: AC
  Administered 2018-01-14: 370 mg via INTRAVENOUS
  Filled 2018-01-14: qty 37

## 2018-01-14 MED ORDER — PALONOSETRON HCL INJECTION 0.25 MG/5ML
0.2500 mg | Freq: Once | INTRAVENOUS | Status: AC
  Administered 2018-01-14: 0.25 mg via INTRAVENOUS

## 2018-01-14 MED ORDER — PALONOSETRON HCL INJECTION 0.25 MG/5ML
INTRAVENOUS | Status: AC
Start: 2018-01-14 — End: 2018-01-14
  Filled 2018-01-14: qty 5

## 2018-01-14 MED ORDER — DIPHENHYDRAMINE HCL 25 MG PO CAPS
ORAL_CAPSULE | ORAL | Status: AC
  Filled 2018-01-14: qty 1

## 2018-01-14 MED ORDER — SODIUM CHLORIDE 0.9% FLUSH
10.0000 mL | Freq: Once | INTRAVENOUS | Status: AC
  Administered 2018-01-14: 10 mL
  Filled 2018-01-14: qty 10

## 2018-01-14 MED ORDER — SODIUM CHLORIDE 0.9 % IV SOLN
Freq: Once | INTRAVENOUS | Status: AC
  Administered 2018-01-14: 15:00:00 via INTRAVENOUS

## 2018-01-14 MED ORDER — SODIUM CHLORIDE 0.9% FLUSH
10.0000 mL | INTRAVENOUS | Status: DC | PRN
  Administered 2018-01-14: 10 mL
  Filled 2018-01-14: qty 10

## 2018-01-14 NOTE — Patient Instructions (Addendum)
[facility] Discharge Instructions for Patients Receiving Chemotherapy  Today you received the following chemotherapy agents: Carboplatin (Paraplatin).  To help prevent nausea and vomiting after your treatment, we encourage you to take your nausea medication as directed. Received Aloxi during treatment today-->Take Compazine (not Zofran) for the next 3 days as needed. If you develop nausea and vomiting that is not controlled by your nausea medication, call the clinic.   BELOW ARE SYMPTOMS THAT SHOULD BE REPORTED IMMEDIATELY:  *FEVER GREATER THAN 100.5 F  *CHILLS WITH OR WITHOUT FEVER  NAUSEA AND VOMITING THAT IS NOT CONTROLLED WITH YOUR NAUSEA MEDICATION  *UNUSUAL SHORTNESS OF BREATH  *UNUSUAL BRUISING OR BLEEDING  TENDERNESS IN MOUTH AND THROAT WITH OR WITHOUT PRESENCE OF ULCERS  *URINARY PROBLEMS  *BOWEL PROBLEMS  UNUSUAL RASH Items with * indicate a potential emergency and should be followed up as soon as possible.  Feel free to call the clinic should you have any questions or concerns. The clinic phone number is [phone].  Please show the [facility] at check-in to the Emergency Department and triage nurse.

## 2018-01-15 ENCOUNTER — Ambulatory Visit (HOSPITAL_COMMUNITY): Payer: Medicare Other

## 2018-01-15 ENCOUNTER — Encounter (HOSPITAL_COMMUNITY): Payer: Self-pay

## 2018-01-15 DIAGNOSIS — R2689 Other abnormalities of gait and mobility: Secondary | ICD-10-CM

## 2018-01-15 DIAGNOSIS — M79662 Pain in left lower leg: Secondary | ICD-10-CM

## 2018-01-15 DIAGNOSIS — M6281 Muscle weakness (generalized): Secondary | ICD-10-CM

## 2018-01-15 LAB — CA 125: CANCER ANTIGEN (CA) 125: 177.4 U/mL — AB (ref 0.0–38.1)

## 2018-01-15 NOTE — Therapy (Signed)
[place], [place], [address] Phone: [phone]   Fax:  [phone]    Progress Note Reporting Period 12/23/17 to 01/15/18  See note below for Objective Data and Assessment of Progress/Goals.       Physical Therapy Treatment/Reassessment  Patient Details  Name: [name] MRN: [id number] Date of Birth: January 11, 1933 Referring Provider: Dr. Heath Lark   Encounter Date: 01/15/2018  PT End of Session - 01/15/18 1033    Visit Number  9    Number of Visits  18    Date for PT Re-Evaluation  02/03/18    Authorization Type  Medicare    Authorization Time Period  12/23/17 to 02/03/18    PT Start Time  1031    PT Stop Time  1113    PT Time Calculation (min)  42 min    Equipment Utilized During Treatment  Oxygen    Activity Tolerance  Patient tolerated treatment well;Patient limited by fatigue    Behavior During Therapy  [facility] for tasks assessed/performed       Past Medical History:  Diagnosis Date  . COPD (chronic obstructive pulmonary disease) ([place])   . Depression   . Diverticulosis   . Family hx of colon cancer   . Fibromyalgia   . GERD (gastroesophageal reflux disease)   . Hemorrhoids   . Hiatal hernia   . History of shingles   . Hypothyroidism   . IBS (irritable bowel syndrome)   . Lymphocytic colitis   . Osteoporosis   . Ovarian cancer ([place])   . Paroxysmal atrial fibrillation (H[facility])   . Psoriatic arthritis ([place])   . Rectal polyp   . Schatzki's ring   . Uterine polyp     Past Surgical History:  Procedure Laterality Date  . CHOLECYSTECTOMY  Dec 04, 2010  . COLONOSCOPY    . FEMUR IM NAIL Left 12/11/2013   Procedure: INTRAMEDULLARY (IM) NAIL FEMORAL;  Surgeon: [name], MD;  Location: [facility];  Service: Orthopedics;  Laterality: Left;  . FRACTURE SURGERY Left 12/11/2013   hip  . IR FLUORO GUIDE PORT INSERTION RIGHT  07/22/2017  . IR PARACENTESIS  07/12/2017  . IR US GUIDE VASC ACCESS RIGHT   07/22/2017  . KNEE SURGERY Left    x 2  . TOTAL KNEE REVISION  08/06/2012   Procedure: TOTAL KNEE REVISION;  Surgeon: [name], MD;  Location: [facility];  Service: Orthopedics;  Laterality: Left;  Left Total Knee Arthroplasty Revision  . Uterine polypectomy      There were no vitals filed for this visit.  Subjective Assessment - 01/15/18 1034    Subjective  Pt states that she is feeling pretty good today. Her legs are still hurting a little bit, but not too bad.     Limitations  Walking;Standing    How long can you sit comfortably?  not limited    How long can you stand comfortably?  a few mins    How long can you walk comfortably?  230f or > at the nursing home    Patient Stated Goals  Would like to come to clinic to build up her strength and endurance    Currently in Pain?  Yes    Pain Score  3     Pain Location  Buttocks    Pain Orientation  Right;Left    Pain Descriptors / Indicators  Aching    Pain Type  Chronic pain  Pain Onset  In the past 7 days    Pain Frequency  Constant    Aggravating Factors   nothing    Pain Relieving Factors  activity    Effect of Pain on Daily Activities  decreased ability to complete ADLs         [facility] PT Assessment - 01/15/18 0001      Assessment   Medical Diagnosis  Generalized Weakness    Referring Provider  Dr. Heath Lark    Prior Therapy  pt d/c from SNF on 12/19/17      Strength   Right Hip Flexion  4+/5 was 4    Right Hip Extension  4-/5 was 3+    Right Hip ABduction  4/5 was 4    Left Hip Flexion  4+/5 was 4    Left Hip Extension  3+/5 was 2+    Left Hip ABduction  4/5 was 3+    Right Knee Flexion  5/5 was 4    Left Knee Flexion  4+/5 was 4    Left Knee Extension  4+/5 was 4    Right Ankle Dorsiflexion  4+/5 was 4    Left Ankle Dorsiflexion  4/5 was 4      Ambulation/Gait   Ambulation/Gait  Yes    Ambulation Distance (Feet)  346 Feet 3MWT; was 23f RW during 3MWT    Assistive device  Rolling walker    Gait  velocity  0.827m (decreased as buttock pain incresaed)    Gait Comments  1 rest break after 28063fue to buttock pain, not cardiovascular was 1 rest break      Balance   Balance Assessed  Yes      Static Standing Balance   Static Standing - Balance Support  Right upper extremity supported    Static Standing Balance -  Activities   Single Leg Stance - Right Leg;Single Leg Stance - Left Leg    Static Standing - Comment/# of Minutes  R: 20 sec with 1 fingertip assist; L: 7 sec with 1 fingertip assist was 7 sec on R and 10 sec on L with BUE support      Standardized Balance Assessment   Standardized Balance Assessment  Five Times Sit to Stand    Five times sit to stand comments   13.3sec, chair, no UE was 20.6sec, chair, no UE          PT Education - 01/15/18 1115    Education provided  Yes    Education Details  reassessment findings, will continue current POC    Person(s) Educated  Patient    Methods  Explanation    Comprehension  Verbalized understanding       PT Short Term Goals - 01/15/18 1036      PT SHORT TERM GOAL #1   Title  Patient will ambulated >250 feet with rolling walker during the 3 MWT to improve her ability to ambulate further within her home before needing to rest.    Baseline  4/10: 280f40fth RW    Time  3    Period  Weeks    Status  Achieved      PT SHORT TERM GOAL #2   Title  Patient with tolerate the standing position with upper extremity support for 5 minutes before needing to rest to improve her ability to perform standing functional activities and ADLs within her home environment.    Baseline  3/18: able to stand 5 mins with no UE  support    Time  3    Period  Weeks    Status  Achieved      PT SHORT TERM GOAL #3   Title  Patient will increase MMT grades by 1/2 grade to show improvement in her strength and stability to perform functional activities.    Baseline  4/10: see MMT    Time  3    Period  Weeks    Status  Partially Met      PT SHORT  TERM GOAL #4   Title  Patient will be able to stand on either foot for 10 seconds to exhibit improved balance and decreased risk for falls.    Baseline  4/10: 20sec R and 7 sec L with 1 fingertip assist throughout    Time  3    Period  Weeks    Status  On-going        PT Long Term Goals - 01/15/18 1036      PT LONG TERM GOAL #1   Title  Patient will ambulated >500 feet with rolling walker during the 3 MWT to improve her ability to ambulate further  in community to allow to her go enjoy going out for a meal.    Baseline  4/10: 243f straight without a break; 3465ftotal during 3MWT    Time  6    Period  Weeks    Status  On-going      PT LONG TERM GOAL #2   Title  Patient with tolerate the standing position with upper extremity support for 10 minutes before needing to rest to improve her ability to stand and have a conversation with a friend or relative.    Baseline  4/10: able to stand statically for 10 mins this date with no UE support    Time  6    Period  Weeks    Status  Achieved      PT LONG TERM GOAL #3   Title  Patient will increase MMT grades by 1 grade to show continued improvement in her strength and stability to perform functional activities at home independently.    Baseline  4/10: see MMT    Time  6    Period  Weeks    Status  Partially Met      PT LONG TERM GOAL #4   Title  Patient will be able to stand on either foot for 15 seconds to exhibit improved balance and further decreased risk for falls.    Baseline  4/10: 20sec R and 7 sec L with 1 fingertip assist throughout    Time  6    Period  Weeks    Status  On-going      PT LONG TERM GOAL #5   Title  Pt will be able to perform 5xSTS in 12 sec or < with no UE in order to demo improved functional BLE strength and overall balance.    Baseline  4/10: 13.3sec, chair, no UE    Time  6    Period  Weeks    Status  On-going      PT LONG TERM GOAL #6   Title  Pt will report requiring assistance for ADLs and IADLs  at home 25% of the time or < to demonstrate improved overall functional strength, endurance, and balance in order to promote independence at home.    Baseline  4/10: feels that her nurse aide and dtr provide about 25% assistance for ADLs and  IADLs    Time  6    Period  Weeks    Status  Achieved            Plan - 01/15/18 1113    Clinical Impression Statement  PT reassessed pt's goals and outcome measures this date. Pt has made great progress thus far as illustrated above. Her overall MMT and functional strength has improved AEB improvements in isolated MMT testing and improvements in 5xSTS. She was able to ambulate 214f straight this date during 3MWT before requiring a seated rest break due to buttock pain. Overall, pt states that therapy is helping her a great deal. She states that just about everything has improved. She states she's able to get around her house some without her walker, which is something she couldn't do before. She is still deficient in her proximal hip musculature, muscular endurance, balance, and overall gait AEB difficulty balancing on 1 limb without UE support and her need to take rest breaks during 3MWT. Overall, pt has responded well to this current POC and it should be continued as planned, progressing as able going forward    Rehab Potential  Good    PT Frequency  3x / week    PT Duration  6 weeks    PT Treatment/Interventions  ADLs/Self Care Home Management;Gait training;Stair training;Functional mobility training;Therapeutic activities;Patient/family education;Balance training;Neuromuscular re-education;Therapeutic exercise;Manual techniques;Energy conservation    PT Next Visit Plan  cotninue balance; Allow adequate rest breaks for patient, monitor HR; continue stregnthening and endurance activities for hip/glutes strength; continue HIIT on Nustep    PT Home Exercise Plan  intial - lap walking at home; supine bridges; 3/18: supine clams with RTB; 3/25: SAQ; 4/5:  standing abd, marching, LAQ, knee flexion    Consulted and Agree with Plan of Care  Patient       Patient will benefit from skilled therapeutic intervention in order to improve the following deficits and impairments:  Abnormal gait, Pain, Decreased mobility, Decreased coordination, Decreased activity tolerance, Decreased endurance, Decreased strength, Decreased balance, Difficulty walking  Visit Diagnosis: Muscle weakness (generalized)  Pain in left lower leg  Other abnormalities of gait and mobility     Problem List Patient Active Problem List   Diagnosis Date Noted  . Thrombocytopenia ([facility] 01/03/2018  . Anemia, chronic disease 12/13/2017  . Mild protein-calorie malnutrition ([facility] 12/13/2017  . Peripheral neuropathy due to chemotherapy ([facility] 12/13/2017  . S/P thoracentesis   . Atrial fibrillation with RVR ([facility] 11/08/2017  . HCAP (healthcare-associated pneumonia) 11/08/2017  . Acute on chronic diastolic heart failure ([facility] 08/19/2017  . Pancytopenia, acquired ([facility] 08/15/2017  . Protein-calorie malnutrition, moderate ([facility] 07/30/2017  . Generalized weakness 07/30/2017  . Antineoplastic chemotherapy induced pancytopenia ([facility] [place] 07/20/2017  . Ovarian CA, right ([facility] 07/18/2017  . PNA (pneumonia) 07/07/2017  . Ascites 06/30/2017  . Ascites, malignant 06/30/2017  . Right ovarian cyst 01/18/2017  . Elevated CA-125 01/18/2017  . Depression 12/23/2015  . Peripheral edema 08/23/2015  . Fracture of hip, left, closed ([facility] 12/08/2013  . Hip fracture ([facility] 12/08/2013  . PAD (peripheral artery disease) ([facility] 11/25/2013  . Encounter for therapeutic drug monitoring 11/09/2013  . Atrial fibrillation ([facility] 05/07/2013  . Cough 03/20/2013  . Total knee replacement status 10/07/2012  . Knee pain 10/07/2012  . Knee stiffness 10/07/2012  . Tachycardia 09/17/2012  . Chest pain 09/17/2012  . Difficulty in walking(719.7) 09/16/2012  . Muscle weakness (generalized) 09/16/2012  . Postop  Acute blood loss anemia 08/08/2012  . Instability of  prosthetic knee ([place]) 08/06/2012  . Bloating 02/14/2012  . Upper abdominal pain 02/14/2012  . Allergic rhinitis, seasonal 02/15/2011  . COLITIS 02/20/2010  . Diarrhea 01/02/2010  . ABDOMINAL PAIN -GENERALIZED 01/02/2010  . PERSONAL HX COLONIC POLYPS 01/02/2010  . HYPOTHYROIDISM 12/28/2009  . COPD (chronic obstructive pulmonary disease) ([place]) 12/28/2009  . ARTHRITIS 12/28/2009       Geraldine Solar PT, DPT  [facility] [address] [place] [address] [place], [place], [address] Phone: [phone]   Fax:  [phone]  Name: [name] MRN: [id number] Date of Birth: [birth year]

## 2018-01-17 ENCOUNTER — Encounter (HOSPITAL_COMMUNITY): Payer: Self-pay

## 2018-01-17 ENCOUNTER — Ambulatory Visit (HOSPITAL_COMMUNITY): Payer: Medicare Other

## 2018-01-17 DIAGNOSIS — M6281 Muscle weakness (generalized): Secondary | ICD-10-CM | POA: Diagnosis not present

## 2018-01-17 DIAGNOSIS — M79662 Pain in left lower leg: Secondary | ICD-10-CM

## 2018-01-17 DIAGNOSIS — R2689 Other abnormalities of gait and mobility: Secondary | ICD-10-CM

## 2018-01-17 NOTE — Therapy (Signed)
[place], [place], [address] Phone: [phone]   Fax:  [phone]  Physical Therapy Treatment  Patient Details  Name: [name] MRN: [id number] Date of Birth: 25-Feb-1933 Referring Provider: Dr. Heath Lark   Encounter Date: 01/17/2018  PT End of Session - 01/17/18 1029    Visit Number  10    Number of Visits  18    Date for PT Re-Evaluation  02/03/18    Authorization Type  Medicare    Authorization Time Period  12/23/17 to 02/03/18    PT Start Time  1030    PT Stop Time  1059    PT Time Calculation (min)  29 min    Equipment Utilized During Treatment  Oxygen    Activity Tolerance  Patient tolerated treatment well;Patient limited by fatigue    Behavior During Therapy  [facility] for tasks assessed/performed       Past Medical History:  Diagnosis Date  . COPD (chronic obstructive pulmonary disease) ([place])   . Depression   . Diverticulosis   . Family hx of colon cancer   . Fibromyalgia   . GERD (gastroesophageal reflux disease)   . Hemorrhoids   . Hiatal hernia   . History of shingles   . Hypothyroidism   . IBS (irritable bowel syndrome)   . Lymphocytic colitis   . Osteoporosis   . Ovarian cancer ([place])   . Paroxysmal atrial fibrillation (H[facility])   . Psoriatic arthritis ([place])   . Rectal polyp   . Schatzki's ring   . Uterine polyp     Past Surgical History:  Procedure Laterality Date  . CHOLECYSTECTOMY  Dec 04, 2010  . COLONOSCOPY    . FEMUR IM NAIL Left 12/11/2013   Procedure: INTRAMEDULLARY (IM) NAIL FEMORAL;  Surgeon: [name], MD;  [facility];  Service: Orthopedics;  Laterality: Left;  . FRACTURE SURGERY Left 12/11/2013   hip  . IR FLUORO GUIDE PORT INSERTION RIGHT  07/22/2017  . IR PARACENTESIS  07/12/2017  . IR US GUIDE VASC ACCESS RIGHT  07/22/2017  . KNEE SURGERY Left    x 2  . TOTAL KNEE REVISION  08/06/2012   Procedure: TOTAL KNEE REVISION;  Surgeon: [name], MD;   [facility];  Service: Orthopedics;  Laterality: Left;  Left Total Knee Arthroplasty Revision  . Uterine polypectomy      There were no vitals filed for this visit.  Subjective Assessment - 01/17/18 1029    Subjective  Pt states she didn't sleep well last night due to shoulder pain. She also feels jittery right now.     Limitations  Walking;Standing    How long can you sit comfortably?  not limited    How long can you stand comfortably?  a few mins    How long can you walk comfortably?  213f or > at the nursing home    Patient Stated Goals  Would like to come to clinic to build up her strength and endurance    Currently in Pain?  Yes    Pain Score  6     Pain Location  Generalized    Pain Descriptors / Indicators  Aching;Dull    Pain Type  Chronic pain    Pain Onset  In the past 7 days    Pain Frequency  Constant    Aggravating Factors   nothing    Pain Relieving Factors  activity    Effect  of Pain on Daily Activities  decreased ability to complete ADLs            [facility] Adult PT Treatment/Exercise - 01/17/18 0001      Knee/Hip Exercises: Standing   Lateral Step Up  Both;10 reps;Hand Hold: 2;Step Height: 6"    Forward Step Up  Both;10 reps;Hand Hold: 2;Step Height: 6"    Functional Squat  10 reps;Limitations    Functional Squat Limitations  BUE support on // bars    Other Standing Knee Exercises  sidestepping RTB x3RT in // bars      Knee/Hip Exercises: Seated   Sit to Sand  10 reps;without UE support bil 1# weights           PT Education - 01/17/18 1029    Education provided  Yes    Education Details  exercise technique    Person(s) Educated  Patient    Methods  Explanation;Demonstration    Comprehension  Verbalized understanding;Returned demonstration       PT Short Term Goals - 01/15/18 1036      PT SHORT TERM GOAL #1   Title  Patient will ambulated >250 feet with rolling walker during the 3 MWT to improve her ability to ambulate further within her  home before needing to rest.    Baseline  4/10: 245f with RW    Time  3    Period  Weeks    Status  Achieved      PT SHORT TERM GOAL #2   Title  Patient with tolerate the standing position with upper extremity support for 5 minutes before needing to rest to improve her ability to perform standing functional activities and ADLs within her home environment.    Baseline  3/18: able to stand 5 mins with no UE support    Time  3    Period  Weeks    Status  Achieved      PT SHORT TERM GOAL #3   Title  Patient will increase MMT grades by 1/2 grade to show improvement in her strength and stability to perform functional activities.    Baseline  4/10: see MMT    Time  3    Period  Weeks    Status  Partially Met      PT SHORT TERM GOAL #4   Title  Patient will be able to stand on either foot for 10 seconds to exhibit improved balance and decreased risk for falls.    Baseline  4/10: 20sec R and 7 sec L with 1 fingertip assist throughout    Time  3    Period  Weeks    Status  On-going        PT Long Term Goals - 01/15/18 1036      PT LONG TERM GOAL #1   Title  Patient will ambulated >500 feet with rolling walker during the 3 MWT to improve her ability to ambulate further  in community to allow to her go enjoy going out for a meal.    Baseline  4/10: 2847fstraight without a break; 34694fotal during 3MWT    Time  6    Period  Weeks    Status  On-going      PT LONG TERM GOAL #2   Title  Patient with tolerate the standing position with upper extremity support for 10 minutes before needing to rest to improve her ability to stand and have a conversation with a friend or relative.  Baseline  4/10: able to stand statically for 10 mins this date with no UE support    Time  6    Period  Weeks    Status  Achieved      PT LONG TERM GOAL #3   Title  Patient will increase MMT grades by 1 grade to show continued improvement in her strength and stability to perform functional activities at  home independently.    Baseline  4/10: see MMT    Time  6    Period  Weeks    Status  Partially Met      PT LONG TERM GOAL #4   Title  Patient will be able to stand on either foot for 15 seconds to exhibit improved balance and further decreased risk for falls.    Baseline  4/10: 20sec R and 7 sec L with 1 fingertip assist throughout    Time  6    Period  Weeks    Status  On-going      PT LONG TERM GOAL #5   Title  Pt will be able to perform 5xSTS in 12 sec or < with no UE in order to demo improved functional BLE strength and overall balance.    Baseline  4/10: 13.3sec, chair, no UE    Time  6    Period  Weeks    Status  On-going      PT LONG TERM GOAL #6   Title  Pt will report requiring assistance for ADLs and IADLs at home 25% of the time or < to demonstrate improved overall functional strength, endurance, and balance in order to promote independence at home.    Baseline  4/10: feels that her nurse aide and dtr provide about 25% assistance for ADLs and IADLs    Time  6    Period  Weeks    Status  Achieved            Plan - 01/17/18 1104    Clinical Impression Statement  Continued with established POC focusing on gluteal and functional. Pt continues to require frequent rest breaks due to BLE fatigue and SOB; recovery times are improving steadily. Able to progress step height to 6" this date with good tolerance. Pt c/o some dizziness/lightheadedness and some increased jitteriness this date. Vitals WNL as her BP was 139/59 with HR 112. Pt wishing to end session early due to not feeling well. PT encouraged pt to rest and hydrate and to perform her HEP this afternoon if she felt better.    Rehab Potential  Good    PT Frequency  3x / week    PT Duration  6 weeks    PT Treatment/Interventions  ADLs/Self Care Home Management;Gait training;Stair training;Functional mobility training;Therapeutic activities;Patient/family education;Balance training;Neuromuscular re-education;Therapeutic  exercise;Manual techniques;Energy conservation    PT Next Visit Plan  cotninue balance; Allow adequate rest breaks for patient, monitor HR; continue stregnthening and endurance activities for hip/glutes strength; continue HIIT on Nustep    PT Home Exercise Plan  intial - lap walking at home; supine bridges; 3/18: supine clams with RTB; 3/25: SAQ; 4/5: standing abd, marching, LAQ, knee flexion    Consulted and Agree with Plan of Care  Patient       Patient will benefit from skilled therapeutic intervention in order to improve the following deficits and impairments:  Abnormal gait, Pain, Decreased mobility, Decreased coordination, Decreased activity tolerance, Decreased endurance, Decreased strength, Decreased balance, Difficulty walking  Visit Diagnosis: Muscle weakness (generalized)  Pain in left lower leg  Other abnormalities of gait and mobility     Problem List Patient Active Problem List   Diagnosis Date Noted  . Thrombocytopenia ([place]) 01/03/2018  . Anemia, chronic disease 12/13/2017  . Mild protein-calorie malnutrition ([place]) 12/13/2017  . Peripheral neuropathy due to chemotherapy ([place]) 12/13/2017  . S/P thoracentesis   . Atrial fibrillation with RVR ([place]) 11/08/2017  . HCAP (healthcare-associated pneumonia) 11/08/2017  . Acute on chronic diastolic heart failure ([place]) 08/19/2017  . Pancytopenia, acquired ([place]) 08/15/2017  . Protein-calorie malnutrition, moderate ([place]) 07/30/2017  . Generalized weakness 07/30/2017  . Antineoplastic chemotherapy induced pancytopenia ([place]) 07/20/2017  . Ovarian CA, right ([place]) 07/18/2017  . PNA (pneumonia) 07/07/2017  . Ascites 06/30/2017  . Ascites, malignant 06/30/2017  . Right ovarian cyst 01/18/2017  . Elevated CA-125 01/18/2017  . Depression 12/23/2015  . Peripheral edema 08/23/2015  . Fracture of hip, left, closed ([place]) 12/08/2013  . Hip fracture ([place]) 12/08/2013  . PAD (peripheral artery disease) ([place]) 11/25/2013  . Encounter for  therapeutic drug monitoring 11/09/2013  . Atrial fibrillation ([place]) 05/07/2013  . Cough 03/20/2013  . Total knee replacement status 10/07/2012  . Knee pain 10/07/2012  . Knee stiffness 10/07/2012  . Tachycardia 09/17/2012  . Chest pain 09/17/2012  . Difficulty in walking(719.7) 09/16/2012  . Muscle weakness (generalized) 09/16/2012  . Postop Acute blood loss anemia 08/08/2012  . Instability of prosthetic knee ([place]) 08/06/2012  . Bloating 02/14/2012  . Upper abdominal pain 02/14/2012  . Allergic rhinitis, seasonal 02/15/2011  . COLITIS 02/20/2010  . Diarrhea 01/02/2010  . ABDOMINAL PAIN -GENERALIZED 01/02/2010  . PERSONAL HX COLONIC POLYPS 01/02/2010  . HYPOTHYROIDISM 12/28/2009  . COPD (chronic obstructive pulmonary disease) ([place]) 12/28/2009  . ARTHRITIS 12/28/2009      Geraldine Solar PT, DPT  [facility] [address] [place], [place], [address] Phone: [phone]   Fax:  [phone]  Name: JONNAE FONSECA MRN: [id number] Date of Birth: 01/21/1933

## 2018-01-20 ENCOUNTER — Ambulatory Visit (HOSPITAL_COMMUNITY): Payer: Medicare Other

## 2018-01-20 ENCOUNTER — Encounter (HOSPITAL_COMMUNITY): Payer: Self-pay

## 2018-01-20 ENCOUNTER — Telehealth: Payer: Self-pay

## 2018-01-20 DIAGNOSIS — M6281 Muscle weakness (generalized): Secondary | ICD-10-CM

## 2018-01-20 DIAGNOSIS — R2689 Other abnormalities of gait and mobility: Secondary | ICD-10-CM

## 2018-01-20 DIAGNOSIS — M79662 Pain in left lower leg: Secondary | ICD-10-CM

## 2018-01-20 NOTE — Patient Instructions (Signed)
Access Code: [email or web address]  URL: [email or web address]  Date: 01/20/2018  Prepared by: Geraldine Solar   Exercises  Supine Figure 4 Piriformis Stretch - 3-5 reps - 30-60 hold - 1x daily - 7x weekly  Hooklying Single Knee to Chest - 3-5 reps - 30-60 hold - 1x daily - 7x weekly

## 2018-01-20 NOTE — Therapy (Signed)
[place], [place], [address] Phone: [phone]   Fax:  [phone]  Physical Therapy Treatment  Patient Details  Name: [name] MRN: [id number] Date of Birth: 1933/05/05 Referring Provider: Dr. Heath Lark   Encounter Date: 01/20/2018  PT End of Session - 01/20/18 1028    Visit Number  11    Number of Visits  18    Date for PT Re-Evaluation  02/03/18    Authorization Type  Medicare    Authorization Time Period  12/23/17 to 02/03/18    PT Start Time  1028    PT Stop Time  1110    PT Time Calculation (min)  42 min    Equipment Utilized During Treatment  Oxygen    Activity Tolerance  Patient tolerated treatment well;Patient limited by fatigue    Behavior During Therapy  [facility] for tasks assessed/performed       Past Medical History:  Diagnosis Date  . COPD (chronic obstructive pulmonary disease) ([place])   . Depression   . Diverticulosis   . Family hx of colon cancer   . Fibromyalgia   . GERD (gastroesophageal reflux disease)   . Hemorrhoids   . Hiatal hernia   . History of shingles   . Hypothyroidism   . IBS (irritable bowel syndrome)   . Lymphocytic colitis   . Osteoporosis   . Ovarian cancer ([place])   . Paroxysmal atrial fibrillation ([facility])   . Psoriatic arthritis ([place])   . Rectal polyp   . Schatzki's ring   . Uterine polyp     Past Surgical History:  Procedure Laterality Date  . CHOLECYSTECTOMY  Dec 04, 2010  . COLONOSCOPY    . FEMUR IM NAIL Left 12/11/2013   Procedure: INTRAMEDULLARY (IM) NAIL FEMORAL;  Surgeon: [name], MD;  Location: [facility];  Service: Orthopedics;  Laterality: Left;  . FRACTURE SURGERY Left 12/11/2013   hip  . IR FLUORO GUIDE PORT INSERTION RIGHT  07/22/2017  . IR PARACENTESIS  07/12/2017  . IR US GUIDE VASC ACCESS RIGHT  07/22/2017  . KNEE SURGERY Left    x 2  . TOTAL KNEE REVISION  08/06/2012   Procedure: TOTAL KNEE REVISION;  Surgeon: [name], MD;   Location: [facility];  Service: Orthopedics;  Laterality: Left;  Left Total Knee Arthroplasty Revision  . Uterine polypectomy      There were no vitals filed for this visit.  Subjective Assessment - 01/20/18 1029    Subjective  Pt states that her buttocks are still really bothering her. She had to take 2 pain pills. She wakes up every mroning at 3-3:30AM in terrible pain.     Limitations  Walking;Standing    How long can you sit comfortably?  not limited    How long can you stand comfortably?  a few mins    How long can you walk comfortably?  89f or > at the nursing home    Patient Stated Goals  Would like to come to clinic to build up her strength and endurance    Currently in Pain?  Yes    Pain Score  6     Pain Location  Buttocks    Pain Orientation  Right;Left    Pain Descriptors / Indicators  Dull;Aching    Pain Type  Chronic pain    Pain Onset  In the past 7 days    Pain Frequency  Constant    Aggravating  Factors   nothing    Pain Relieving Factors  activity    Effect of Pain on Daily Activities  decreased ability to complete ADLs             [facility] Adult PT Treatment/Exercise - 01/20/18 0001      Knee/Hip Exercises: Stretches   Other Knee/Hip Stretches  bil supine figure 4 2x30" each    Other Knee/Hip Stretches  bil SKTC 2x30" each      Manual Therapy   Manual Therapy  Soft tissue mobilization    Manual therapy comments  completed separate rest of treatment    Soft tissue mobilization  instrument-assisted STM with green ball to bil glute max, glute med, piriformis      Balance Exercises - 01/20/18 1053      Balance Exercises: Standing   Tandem Stance  Foam/compliant surface;Intermittent upper extremity support;5 reps;10 secs BLE    Rockerboard  Anterior/posterior;Lateral 2 mins each    Tandem Gait  Forward;Intermittent upper extremity support;3 reps in // bars           PT Education - 01/20/18 1050    Education provided  Yes    Education Details  feel  buttock pain is muscular in nature and likely due to the strenghtening that has been being performed over the last few weeks; updated HEP to include stretching    Person(s) Educated  Patient    Methods  Explanation;Demonstration;Handout    Comprehension  Verbalized understanding;Returned demonstration       PT Short Term Goals - 01/15/18 1036      PT SHORT TERM GOAL #1   Title  Patient will ambulated >250 feet with rolling walker during the 3 MWT to improve her ability to ambulate further within her home before needing to rest.    Baseline  4/10: 89f with RW    Time  3    Period  Weeks    Status  Achieved      PT SHORT TERM GOAL #2   Title  Patient with tolerate the standing position with upper extremity support for 5 minutes before needing to rest to improve her ability to perform standing functional activities and ADLs within her home environment.    Baseline  3/18: able to stand 5 mins with no UE support    Time  3    Period  Weeks    Status  Achieved      PT SHORT TERM GOAL #3   Title  Patient will increase MMT grades by 1/2 grade to show improvement in her strength and stability to perform functional activities.    Baseline  4/10: see MMT    Time  3    Period  Weeks    Status  Partially Met      PT SHORT TERM GOAL #4   Title  Patient will be able to stand on either foot for 10 seconds to exhibit improved balance and decreased risk for falls.    Baseline  4/10: 20sec R and 7 sec L with 1 fingertip assist throughout    Time  3    Period  Weeks    Status  On-going        PT Long Term Goals - 01/15/18 1036      PT LONG TERM GOAL #1   Title  Patient will ambulated >500 feet with rolling walker during the 3 MWT to improve her ability to ambulate further  in community to allow to her go  enjoy going out for a meal.    Baseline  4/10: 89f straight without a break; 3456ftotal during 3MWT    Time  6    Period  Weeks    Status  On-going      PT LONG TERM GOAL #2    Title  Patient with tolerate the standing position with upper extremity support for 10 minutes before needing to rest to improve her ability to stand and have a conversation with a friend or relative.    Baseline  4/10: able to stand statically for 10 mins this date with no UE support    Time  6    Period  Weeks    Status  Achieved      PT LONG TERM GOAL #3   Title  Patient will increase MMT grades by 1 grade to show continued improvement in her strength and stability to perform functional activities at home independently.    Baseline  4/10: see MMT    Time  6    Period  Weeks    Status  Partially Met      PT LONG TERM GOAL #4   Title  Patient will be able to stand on either foot for 15 seconds to exhibit improved balance and further decreased risk for falls.    Baseline  4/10: 20sec R and 7 sec L with 1 fingertip assist throughout    Time  6    Period  Weeks    Status  On-going      PT LONG TERM GOAL #5   Title  Pt will be able to perform 5xSTS in 12 sec or < with no UE in order to demo improved functional BLE strength and overall balance.    Baseline  4/10: 13.3sec, chair, no UE    Time  6    Period  Weeks    Status  On-going      PT LONG TERM GOAL #6   Title  Pt will report requiring assistance for ADLs and IADLs at home 25% of the time or < to demonstrate improved overall functional strength, endurance, and balance in order to promote independence at home.    Baseline  4/10: feels that her nurse aide and dtr provide about 25% assistance for ADLs and IADLs    Time  6    Period  Weeks    Status  Achieved            Plan - 01/20/18 1110    Clinical Impression Statement  Pt presents to therapy with continued reports of increased buttock pain. PT assessed pt's gluteals and piriformis for restrictions and pt with multiple taut bands, restrictions, and reported recreation of pain to palpation of these areas. Followed manual with gluteal stretching and added to HEP. Feel pt's  c/o pain due to increased amount of strengthening performed over the last few weeks since she returned to therapy; will continue to address restrictions/c/o pain while also continuing to strengthen within tolerance. Rest of session focused on balance and dynamic strengthening activities. Added rockerboard and fwd tandem gait in the // bars and progressed tandem stance to on foam, all with good tolerance. Continued general increased difficulty with LLE throughout all activities. Continue POC as planned, progressing as able. Pt reported her buttock pain was down to 2/10 at EOS, was 6/10 at beginning.     Rehab Potential  Good    PT Frequency  3x / week    PT Duration  6 weeks    PT Treatment/Interventions  ADLs/Self Care Home Management;Gait training;Stair training;Functional mobility training;Therapeutic activities;Patient/family education;Balance training;Neuromuscular re-education;Therapeutic exercise;Manual techniques;Energy conservation    PT Next Visit Plan  cotninue manual for gluteal restrictions; cotninue balance; Allow adequate rest breaks for patient, monitor HR; continue stregnthening and endurance activities for hip/glutes strength; continue HIIT on Nustep    PT Home Exercise Plan  intial - lap walking at home; supine bridges; 3/18: supine clams with RTB; 3/25: SAQ; 4/5: standing abd, marching, LAQ, knee flexion; 4/15: supine figure 4, supine SKTC    Consulted and Agree with Plan of Care  Patient       Patient will benefit from skilled therapeutic intervention in order to improve the following deficits and impairments:  Abnormal gait, Pain, Decreased mobility, Decreased coordination, Decreased activity tolerance, Decreased endurance, Decreased strength, Decreased balance, Difficulty walking  Visit Diagnosis: Muscle weakness (generalized)  Pain in left lower leg  Other abnormalities of gait and mobility     Problem List Patient Active Problem List   Diagnosis Date Noted  .  Thrombocytopenia ([place]) 01/03/2018  . Anemia, chronic disease 12/13/2017  . Mild protein-calorie malnutrition ([place]) 12/13/2017  . Peripheral neuropathy due to chemotherapy ([place]) 12/13/2017  . S/P thoracentesis   . Atrial fibrillation with RVR ([place]) 11/08/2017  . HCAP (healthcare-associated pneumonia) 11/08/2017  . Acute on chronic diastolic heart failure ([place]) 08/19/2017  . Pancytopenia, acquired ([place]) 08/15/2017  . Protein-calorie malnutrition, moderate ([place]) 07/30/2017  . Generalized weakness 07/30/2017  . Antineoplastic chemotherapy induced pancytopenia ([place]) 07/20/2017  . Ovarian CA, right ([place]) 07/18/2017  . PNA (pneumonia) 07/07/2017  . Ascites 06/30/2017  . Ascites, malignant 06/30/2017  . Right ovarian cyst 01/18/2017  . Elevated CA-125 01/18/2017  . Depression 12/23/2015  . Peripheral edema 08/23/2015  . Fracture of hip, left, closed ([place]) 12/08/2013  . Hip fracture ([place]) 12/08/2013  . PAD (peripheral artery disease) ([place]) 11/25/2013  . Encounter for therapeutic drug monitoring 11/09/2013  . Atrial fibrillation ([place]) 05/07/2013  . Cough 03/20/2013  . Total knee replacement status 10/07/2012  . Knee pain 10/07/2012  . Knee stiffness 10/07/2012  . Tachycardia 09/17/2012  . Chest pain 09/17/2012  . Difficulty in walking(719.7) 09/16/2012  . Muscle weakness (generalized) 09/16/2012  . Postop Acute blood loss anemia 08/08/2012  . Instability of prosthetic knee ([place]) 08/06/2012  . Bloating 02/14/2012  . Upper abdominal pain 02/14/2012  . Allergic rhinitis, seasonal 02/15/2011  . COLITIS 02/20/2010  . Diarrhea 01/02/2010  . ABDOMINAL PAIN -GENERALIZED 01/02/2010  . PERSONAL HX COLONIC POLYPS 01/02/2010  . HYPOTHYROIDISM 12/28/2009  . COPD (chronic obstructive pulmonary disease) ([place]) 12/28/2009  . ARTHRITIS 12/28/2009        Geraldine Solar PT, DPT  [facility] [address] [place], [place], [address] Phone: [phone]    Fax:  [phone]  Name: [name] MRN: [id number] Date of Birth: 1933/04/06

## 2018-01-20 NOTE — Telephone Encounter (Signed)
Daughter called and left message to call her.  Called back. Her Mom is having a lot of pain all over. She is taking Hydrocodone 10-325 mg every 6 hours for pain. Friday she left therapy early because she was having so much pain. She went to therapy today, but may leave early. Her daughter is asking can she take the Hydrocodone Rx more often? A different way?

## 2018-01-20 NOTE — Telephone Encounter (Signed)
Called and given below message. Verbalized understanding. She will take the hydrocodone every 4 hours as needed. Instructed to call office if needed.

## 2018-01-20 NOTE — Telephone Encounter (Signed)
It is not clear why she is having so much pain since she is only on carboplatin The next dose bump would be morphine sulfate or she can increase the frequency to every 4 hours as needed If she wants morphine sulfate, let me know

## 2018-01-21 ENCOUNTER — Telehealth: Payer: Self-pay | Admitting: Emergency Medicine

## 2018-01-21 MED ORDER — TIOTROPIUM BROMIDE-OLODATEROL 2.5-2.5 MCG/ACT IN AERS: 2.0000 | INHALATION_SPRAY | Freq: Every day | RESPIRATORY_TRACT | 3 refills | Status: DC

## 2018-01-21 NOTE — Telephone Encounter (Signed)
Called and spoke with patients daughter. She states that the Stiolto has been working well for the patient. They are ready for the prescription since she is almost out of the sample. Per last OV note from [facility] it is ok to send in prescription. Prescription sent in, nothing further needed.

## 2018-01-22 ENCOUNTER — Encounter (HOSPITAL_COMMUNITY): Payer: Self-pay

## 2018-01-22 ENCOUNTER — Ambulatory Visit (HOSPITAL_COMMUNITY): Payer: Medicare Other

## 2018-01-22 DIAGNOSIS — M79662 Pain in left lower leg: Secondary | ICD-10-CM

## 2018-01-22 DIAGNOSIS — R2689 Other abnormalities of gait and mobility: Secondary | ICD-10-CM

## 2018-01-22 DIAGNOSIS — M6281 Muscle weakness (generalized): Secondary | ICD-10-CM | POA: Diagnosis not present

## 2018-01-22 NOTE — Therapy (Signed)
[place], [place], [address] Phone: [phone]   Fax:  [phone]  Physical Therapy Treatment  Patient Details  Name: [name] MRN: [id number] Date of Birth: 04-02-1933 Referring Provider: Dr. Heath Lark   Encounter Date: 01/22/2018  PT End of Session - 01/22/18 1032    Visit Number  12    Number of Visits  18    Date for PT Re-Evaluation  02/03/18    Authorization Type  Medicare    Authorization Time Period  12/23/17 to 02/03/18    PT Start Time  1030    PT Stop Time  1112    PT Time Calculation (min)  42 min    Equipment Utilized During Treatment  Oxygen    Activity Tolerance  Patient tolerated treatment well;Patient limited by fatigue    Behavior During Therapy  [facility] for tasks assessed/performed       Past Medical History:  Diagnosis Date  . COPD (chronic obstructive pulmonary disease) ([place])   . Depression   . Diverticulosis   . Family hx of colon cancer   . Fibromyalgia   . GERD (gastroesophageal reflux disease)   . Hemorrhoids   . Hiatal hernia   . History of shingles   . Hypothyroidism   . IBS (irritable bowel syndrome)   . Lymphocytic colitis   . Osteoporosis   . Ovarian cancer ([place])   . Paroxysmal atrial fibrillation (H[facility])   . Psoriatic arthritis ([place])   . Rectal polyp   . Schatzki's ring   . Uterine polyp     Past Surgical History:  Procedure Laterality Date  . CHOLECYSTECTOMY  Dec 04, 2010  . COLONOSCOPY    . FEMUR IM NAIL Left 12/11/2013   Procedure: INTRAMEDULLARY (IM) NAIL FEMORAL;  Surgeon: [name], MD;  Location: [facility];  Service: Orthopedics;  Laterality: Left;  . FRACTURE SURGERY Left 12/11/2013   hip  . IR FLUORO GUIDE PORT INSERTION RIGHT  07/22/2017  . IR PARACENTESIS  07/12/2017  . IR US GUIDE VASC ACCESS RIGHT  07/22/2017  . KNEE SURGERY Left    x 2  . TOTAL KNEE REVISION  08/06/2012   Procedure: TOTAL KNEE REVISION;  Surgeon: [name], MD;   Location: [facility];  Service: Orthopedics;  Laterality: Left;  Left Total Knee Arthroplasty Revision  . Uterine polypectomy      There were no vitals filed for this visit.  Subjective Assessment - 01/22/18 1032    Subjective  Pt states that her bil hip pain woke her up again last night but she did her stretches that were added on Monday and she states that they helped her pain go away and she was able to fall back asleep without taking her pain meds.     Limitations  Walking;Standing    How long can you sit comfortably?  not limited    How long can you stand comfortably?  a few mins    How long can you walk comfortably?  89f or > at the nursing home    Patient Stated Goals  Would like to come to clinic to build up her strength and endurance    Currently in Pain?  No/denies no real pain, buttocks feel fatigued/"tired"    Pain Onset  In the past 7 days            [facility]      Knee/Hip Exercises:  Supine   Single Leg Bridge  Both;10 reps    Straight Leg Raise with External Rotation  Both;10 reps      Knee/Hip Exercises: Sidelying   Hip ABduction  Both;10 reps      Manual Therapy   Manual Therapy  Soft tissue mobilization    Manual therapy comments  completed separate rest of treatment    Soft tissue mobilization  instrument-assisted STM with green ball to bil glute max, glute med, piriformis      Balance Exercises - 01/22/18 1052      Balance Exercises: Standing   Tandem Stance  Foam/compliant surface;Intermittent upper extremity support;5 reps;10 secs BLE    Rockerboard  Anterior/posterior;Lateral;Intermittent UE support 2 mins each    Tandem Gait  Forward;Intermittent upper extremity support;3 reps in // bars            PT Education - 01/22/18 1109    Education provided  Yes    Education Details  self-soft tissue mob with tennis ball, updated HEP. exercise technique    Person(s) Educated  Patient    Methods   Explanation;Demonstration;Handout    Comprehension  Verbalized understanding;Returned demonstration       PT Short Term Goals - 01/15/18 1036      PT SHORT TERM GOAL #1   Title  Patient will ambulated >250 feet with rolling walker during the 3 MWT to improve her ability to ambulate further within her home before needing to rest.    Baseline  4/10: 213f with RW    Time  3    Period  Weeks    Status  Achieved      PT SHORT TERM GOAL #2   Title  Patient with tolerate the standing position with upper extremity support for 5 minutes before needing to rest to improve her ability to perform standing functional activities and ADLs within her home environment.    Baseline  3/18: able to stand 5 mins with no UE support    Time  3    Period  Weeks    Status  Achieved      PT SHORT TERM GOAL #3   Title  Patient will increase MMT grades by 1/2 grade to show improvement in her strength and stability to perform functional activities.    Baseline  4/10: see MMT    Time  3    Period  Weeks    Status  Partially Met      PT SHORT TERM GOAL #4   Title  Patient will be able to stand on either foot for 10 seconds to exhibit improved balance and decreased risk for falls.    Baseline  4/10: 20sec R and 7 sec L with 1 fingertip assist throughout    Time  3    Period  Weeks    Status  On-going        PT Long Term Goals - 01/15/18 1036      PT LONG TERM GOAL #1   Title  Patient will ambulated >500 feet with rolling walker during the 3 MWT to improve her ability to ambulate further  in community to allow to her go enjoy going out for a meal.    Baseline  4/10: 2861fstraight without a break; 34636fotal during 3MWT    Time  6    Period  Weeks    Status  On-going      PT LONG TERM GOAL #2   Title  Patient with tolerate  the standing position with upper extremity support for 10 minutes before needing to rest to improve her ability to stand and have a conversation with a friend or relative.     Baseline  4/10: able to stand statically for 10 mins this date with no UE support    Time  6    Period  Weeks    Status  Achieved      PT LONG TERM GOAL #3   Title  Patient will increase MMT grades by 1 grade to show continued improvement in her strength and stability to perform functional activities at home independently.    Baseline  4/10: see MMT    Time  6    Period  Weeks    Status  Partially Met      PT LONG TERM GOAL #4   Title  Patient will be able to stand on either foot for 15 seconds to exhibit improved balance and further decreased risk for falls.    Baseline  4/10: 20sec R and 7 sec L with 1 fingertip assist throughout    Time  6    Period  Weeks    Status  On-going      PT LONG TERM GOAL #5   Title  Pt will be able to perform 5xSTS in 12 sec or < with no UE in order to demo improved functional BLE strength and overall balance.    Baseline  4/10: 13.3sec, chair, no UE    Time  6    Period  Weeks    Status  On-going      PT LONG TERM GOAL #6   Title  Pt will report requiring assistance for ADLs and IADLs at home 25% of the time or < to demonstrate improved overall functional strength, endurance, and balance in order to promote independence at home.    Baseline  4/10: feels that her nurse aide and dtr provide about 25% assistance for ADLs and IADLs    Time  6    Period  Weeks    Status  Achieved            Plan - 01/22/18 1111    Clinical Impression Statement  Pt presents to therapy with much improved buttock pain and stated that the stretches provided at last session tremendously helped her pain during the night last night; she did not have to take a pain pill like she usually does, she was able to fall back asleep without them. Began session with manual to further address and decrease soft tissue restrictions in glutes this date and educated pt on how to perform self-soft tissue mobilization at home with a tennis ball. Rest of session focused on gluteal  strengthening and dynamic balance work. Continued with tandem stance and tandem gait this date due to increased challenge with these. Min cues for proper technique and min unsteadiness during balance work noted but no LOB. No pain reported at EOS, just muscle fatigue. HR monitored during session and it stayed below 120bpm. Continue as planned, progressing as able.    Rehab Potential  Good    PT Frequency  3x / week    PT Duration  6 weeks    PT Treatment/Interventions  ADLs/Self Care Home Management;Gait training;Stair training;Functional mobility training;Therapeutic activities;Patient/family education;Balance training;Neuromuscular re-education;Therapeutic exercise;Manual techniques;Energy conservation    PT Next Visit Plan  giat training with SPC; cotninue manual for gluteal restrictions PRN; cotninue balance; Allow adequate rest breaks for patient, monitor HR; continue stregnthening  and endurance activities for hip/glutes strength; continue HIIT on Nustep PRN    PT Home Exercise Plan  intial - lap walking at home; supine bridges; 3/18: supine clams with RTB; 3/25: SAQ; 4/5: standing abd, marching, LAQ, knee flexion; 4/15: supine figure 4, supine SKTC; 4/17: sidelying hip abd    Consulted and Agree with Plan of Care  Patient       Patient will benefit from skilled therapeutic intervention in order to improve the following deficits and impairments:  Abnormal gait, Pain, Decreased mobility, Decreased coordination, Decreased activity tolerance, Decreased endurance, Decreased strength, Decreased balance, Difficulty walking  Visit Diagnosis: Muscle weakness (generalized)  Pain in left lower leg  Other abnormalities of gait and mobility     Problem List Patient Active Problem List   Diagnosis Date Noted  . Thrombocytopenia ([place]) 01/03/2018  . Anemia, chronic disease 12/13/2017  . Mild protein-calorie malnutrition ([place]) 12/13/2017  . Peripheral neuropathy due to chemotherapy ([place]) 12/13/2017   . S/P thoracentesis   . Atrial fibrillation with RVR ([place]) 11/08/2017  . HCAP (healthcare-associated pneumonia) 11/08/2017  . Acute on chronic diastolic heart failure ([place]) 08/19/2017  . Pancytopenia, acquired ([place]) 08/15/2017  . Protein-calorie malnutrition, moderate ([place]) 07/30/2017  . Generalized weakness 07/30/2017  . Antineoplastic chemotherapy induced pancytopenia ([place]) 07/20/2017  . Ovarian CA, right ([place]) 07/18/2017  . PNA (pneumonia) 07/07/2017  . Ascites 06/30/2017  . Ascites, malignant 06/30/2017  . Right ovarian cyst 01/18/2017  . Elevated CA-125 01/18/2017  . Depression 12/23/2015  . Peripheral edema 08/23/2015  . Fracture of hip, left, closed ([place]) 12/08/2013  . Hip fracture ([place]) 12/08/2013  . PAD (peripheral artery disease) ([place]) 11/25/2013  . Encounter for therapeutic drug monitoring 11/09/2013  . Atrial fibrillation ([place]) 05/07/2013  . Cough 03/20/2013  . Total knee replacement status 10/07/2012  . Knee pain 10/07/2012  . Knee stiffness 10/07/2012  . Tachycardia 09/17/2012  . Chest pain 09/17/2012  . Difficulty in walking(719.7) 09/16/2012  . Muscle weakness (generalized) 09/16/2012  . Postop Acute blood loss anemia 08/08/2012  . Instability of prosthetic knee ([place]) 08/06/2012  . Bloating 02/14/2012  . Upper abdominal pain 02/14/2012  . Allergic rhinitis, seasonal 02/15/2011  . COLITIS 02/20/2010  . Diarrhea 01/02/2010  . ABDOMINAL PAIN -GENERALIZED 01/02/2010  . PERSONAL HX COLONIC POLYPS 01/02/2010  . HYPOTHYROIDISM 12/28/2009  . COPD (chronic obstructive pulmonary disease) ([place]) 12/28/2009  . ARTHRITIS 12/28/2009        Geraldine Solar PT, DPT  [facility] [address] [place], [place], [address] Phone: [phone]   Fax:  [phone]  Name: [name] MRN: [id number] Date of Birth: Sep 26, 1933

## 2018-01-23 ENCOUNTER — Other Ambulatory Visit: Payer: Self-pay | Admitting: Hematology and Oncology

## 2018-01-24 ENCOUNTER — Encounter (HOSPITAL_COMMUNITY): Payer: Self-pay

## 2018-01-24 ENCOUNTER — Ambulatory Visit (HOSPITAL_COMMUNITY): Payer: Medicare Other

## 2018-01-24 DIAGNOSIS — R2689 Other abnormalities of gait and mobility: Secondary | ICD-10-CM

## 2018-01-24 DIAGNOSIS — M6281 Muscle weakness (generalized): Secondary | ICD-10-CM

## 2018-01-24 DIAGNOSIS — M79662 Pain in left lower leg: Secondary | ICD-10-CM

## 2018-01-24 NOTE — Therapy (Signed)
[place], [place], [address] Phone: [phone]   Fax:  [phone]  Physical Therapy Treatment  Patient Details  Name: [name] MRN: [id number] Date of Birth: June 11, 1933 Referring Provider: Dr. Heath Lark   Encounter Date: 01/24/2018  PT End of Session - 01/24/18 1042    Visit Number  13    Number of Visits  18    Date for PT Re-Evaluation  02/03/18    Authorization Type  Medicare    Authorization Time Period  12/23/17 to 02/03/18    PT Start Time  1031    PT Stop Time  1113    PT Time Calculation (min)  42 min    Equipment Utilized During Treatment  Oxygen    Activity Tolerance  Patient tolerated treatment well;Patient limited by fatigue    Behavior During Therapy  [facility] for tasks assessed/performed       Past Medical History:  Diagnosis Date  . COPD (chronic obstructive pulmonary disease) ([place])   . Depression   . Diverticulosis   . Family hx of colon cancer   . Fibromyalgia   . GERD (gastroesophageal reflux disease)   . Hemorrhoids   . Hiatal hernia   . History of shingles   . Hypothyroidism   . IBS (irritable bowel syndrome)   . Lymphocytic colitis   . Osteoporosis   . Ovarian cancer ([place])   . Paroxysmal atrial fibrillation ([facility])   . Psoriatic arthritis ([place])   . Rectal polyp   . Schatzki's ring   . Uterine polyp     Past Surgical History:  Procedure Laterality Date  . CHOLECYSTECTOMY  Dec 04, 2010  . COLONOSCOPY    . FEMUR IM NAIL Left 12/11/2013   Procedure: INTRAMEDULLARY (IM) NAIL FEMORAL;  Surgeon: [name], MD;  [facility];  Service: Orthopedics;  Laterality: Left;  . FRACTURE SURGERY Left 12/11/2013   hip  . IR FLUORO GUIDE PORT INSERTION RIGHT  07/22/2017  . IR PARACENTESIS  07/12/2017  . IR US GUIDE VASC ACCESS RIGHT  07/22/2017  . KNEE SURGERY Left    x 2  . TOTAL KNEE REVISION  08/06/2012   Procedure: TOTAL KNEE REVISION;  Surgeon: [name], MD;   [facility];  Service: Orthopedics;  Laterality: Left;  Left Total Knee Arthroplasty Revision  . Uterine polypectomy      There were no vitals filed for this visit.  Subjective Assessment - 01/24/18 1043    Subjective  Pt states she can't figure out how to use the tennis ball to hit the sore spots on her glutes that are higher up.     Limitations  Walking;Standing    How long can you sit comfortably?  not limited    How long can you stand comfortably?  a few mins    How long can you walk comfortably?  89f or > at the nursing home    Patient Stated Goals  Would like to come to clinic to build up her strength and endurance    Currently in Pain?  Yes    Pain Score  4     Pain Location  Hip    Pain Orientation  Right    Pain Descriptors / Indicators  Aching;Dull    Pain Type  Chronic pain    Pain Onset  In the past 7 days    Pain Frequency  Constant    Aggravating Factors  nothing    Pain Relieving Factors  activity    Effect of Pain on Daily Activities  decreased ability to complete ADLs            [facility] Adult PT Treatment/Exercise - 01/24/18 0001      Knee/Hip Exercises: Standing   Wall Squat  10 reps    Gait Training  gait with SPC x243f    Other Standing Knee Exercises  sidestepping on blue line x2RT      Balance Exercises - 01/24/18 1103      Balance Exercises: Standing   Tandem Gait  Forward;Retro;Intermittent upper extremity support;3 reps in // bars    Marching Limitations  marching on foam 10x3" holds    Other Standing Exercises  --           PT Education - 01/24/18 1042    Education provided  Yes    Education Details  gait with SPC, self-soft tissue mobs with tennis ball in standing, exercise technique    Person(s) Educated  Patient    Methods  Explanation;Demonstration    Comprehension  Verbalized understanding;Returned demonstration       PT Short Term Goals - 01/15/18 1036      PT SHORT TERM GOAL #1   Title  Patient will ambulated >250  feet with rolling walker during the 3 MWT to improve her ability to ambulate further within her home before needing to rest.    Baseline  4/10: 2831fwith RW    Time  3    Period  Weeks    Status  Achieved      PT SHORT TERM GOAL #2   Title  Patient with tolerate the standing position with upper extremity support for 5 minutes before needing to rest to improve her ability to perform standing functional activities and ADLs within her home environment.    Baseline  3/18: able to stand 5 mins with no UE support    Time  3    Period  Weeks    Status  Achieved      PT SHORT TERM GOAL #3   Title  Patient will increase MMT grades by 1/2 grade to show improvement in her strength and stability to perform functional activities.    Baseline  4/10: see MMT    Time  3    Period  Weeks    Status  Partially Met      PT SHORT TERM GOAL #4   Title  Patient will be able to stand on either foot for 10 seconds to exhibit improved balance and decreased risk for falls.    Baseline  4/10: 20sec R and 7 sec L with 1 fingertip assist throughout    Time  3    Period  Weeks    Status  On-going        PT Long Term Goals - 01/15/18 1036      PT LONG TERM GOAL #1   Title  Patient will ambulated >500 feet with rolling walker during the 3 MWT to improve her ability to ambulate further  in community to allow to her go enjoy going out for a meal.    Baseline  4/10: 28031ftraight without a break; 346f15ftal during 3MWT    Time  6    Period  Weeks    Status  On-going      PT LONG TERM GOAL #2   Title  Patient with tolerate the standing position with upper extremity  support for 10 minutes before needing to rest to improve her ability to stand and have a conversation with a friend or relative.    Baseline  4/10: able to stand statically for 10 mins this date with no UE support    Time  6    Period  Weeks    Status  Achieved      PT LONG TERM GOAL #3   Title  Patient will increase MMT grades by 1 grade to  show continued improvement in her strength and stability to perform functional activities at home independently.    Baseline  4/10: see MMT    Time  6    Period  Weeks    Status  Partially Met      PT LONG TERM GOAL #4   Title  Patient will be able to stand on either foot for 15 seconds to exhibit improved balance and further decreased risk for falls.    Baseline  4/10: 20sec R and 7 sec L with 1 fingertip assist throughout    Time  6    Period  Weeks    Status  On-going      PT LONG TERM GOAL #5   Title  Pt will be able to perform 5xSTS in 12 sec or < with no UE in order to demo improved functional BLE strength and overall balance.    Baseline  4/10: 13.3sec, chair, no UE    Time  6    Period  Weeks    Status  On-going      PT LONG TERM GOAL #6   Title  Pt will report requiring assistance for ADLs and IADLs at home 25% of the time or < to demonstrate improved overall functional strength, endurance, and balance in order to promote independence at home.    Baseline  4/10: feels that her nurse aide and dtr provide about 25% assistance for ADLs and IADLs    Time  6    Period  Weeks    Status  Achieved            Plan - 01/24/18 1113    Clinical Impression Statement  Began gait training with [place] this date as pt has been very steady with her RW over the last few weeks, and pt states she uses her cane some at home. Pt did well with SPC but required a seated rest break after about 123f due to BLE fatigue. But overall, she did well with it; PT educated pt that due to the [facility], it will take more energy to use it and she verbalized understanding. Able to progress sidestepping to the blue line this date and added mini wall squats for gluteal strengthening. Pt challenged especially with wall slides due to gluteal fatigue. Balance was challenged with marching on foam and progressing to retro tandem gait. She continues to require frequent rest breaks due to SOB and fatigue  but O2 WNL as it was >94% throughout and HR remained below 129bpm.     Rehab Potential  Good    PT Frequency  3x / week    PT Duration  6 weeks    PT Treatment/Interventions  ADLs/Self Care Home Management;Gait training;Stair training;Functional mobility training;Therapeutic activities;Patient/family education;Balance training;Neuromuscular re-education;Therapeutic exercise;Manual techniques;Energy conservation    PT Next Visit Plan  continue gait with SPC for endurance training; cotninue manual for gluteal restrictions PRN; cotninue balance; Allow adequate rest breaks for patient, monitor HR; continue stregnthening and endurance  activities for hip/glutes strength; continue HIIT on Nustep PRN    PT Home Exercise Plan  intial - lap walking at home; supine bridges; 3/18: supine clams with RTB; 3/25: SAQ; 4/5: standing abd, marching, LAQ, knee flexion; 4/15: supine figure 4, supine SKTC; 4/17: sidelying hip abd    Consulted and Agree with Plan of Care  Patient       Patient will benefit from skilled therapeutic intervention in order to improve the following deficits and impairments:  Abnormal gait, Pain, Decreased mobility, Decreased coordination, Decreased activity tolerance, Decreased endurance, Decreased strength, Decreased balance, Difficulty walking  Visit Diagnosis: Muscle weakness (generalized)  Pain in left lower leg  Other abnormalities of gait and mobility     Problem List Patient Active Problem List   Diagnosis Date Noted  . Thrombocytopenia ([place]) 01/03/2018  . Anemia, chronic disease 12/13/2017  . Mild protein-calorie malnutrition ([place]) 12/13/2017  . Peripheral neuropathy due to chemotherapy ([place]) 12/13/2017  . S/P thoracentesis   . Atrial fibrillation with RVR ([place]) 11/08/2017  . HCAP (healthcare-associated pneumonia) 11/08/2017  . Acute on chronic diastolic heart failure ([place]) 08/19/2017  . Pancytopenia, acquired ([place]) 08/15/2017  . Protein-calorie malnutrition, moderate  ([place]) 07/30/2017  . Generalized weakness 07/30/2017  . Antineoplastic chemotherapy induced pancytopenia ([place]) 07/20/2017  . Ovarian CA, right ([place]) 07/18/2017  . PNA (pneumonia) 07/07/2017  . Ascites 06/30/2017  . Ascites, malignant 06/30/2017  . Right ovarian cyst 01/18/2017  . Elevated CA-125 01/18/2017  . Depression 12/23/2015  . Peripheral edema 08/23/2015  . Fracture of hip, left, closed ([place]) 12/08/2013  . Hip fracture ([place]) 12/08/2013  . PAD (peripheral artery disease) ([place]) 11/25/2013  . Encounter for therapeutic drug monitoring 11/09/2013  . Atrial fibrillation ([place]) 05/07/2013  . Cough 03/20/2013  . Total knee replacement status 10/07/2012  . Knee pain 10/07/2012  . Knee stiffness 10/07/2012  . Tachycardia 09/17/2012  . Chest pain 09/17/2012  . Difficulty in walking(719.7) 09/16/2012  . Muscle weakness (generalized) 09/16/2012  . Postop Acute blood loss anemia 08/08/2012  . Instability of prosthetic knee ([place]) 08/06/2012  . Bloating 02/14/2012  . Upper abdominal pain 02/14/2012  . Allergic rhinitis, seasonal 02/15/2011  . COLITIS 02/20/2010  . Diarrhea 01/02/2010  . ABDOMINAL PAIN -GENERALIZED 01/02/2010  . PERSONAL HX COLONIC POLYPS 01/02/2010  . HYPOTHYROIDISM 12/28/2009  . COPD (chronic obstructive pulmonary disease) ([place]) 12/28/2009  . ARTHRITIS 12/28/2009       Geraldine Solar PT, DPT  [facility] [address] [place], [place], [address] Phone: [phone]   Fax:  [phone]  Name: [name] MRN: [id number] Date of Birth: 01-12-1933

## 2018-01-27 ENCOUNTER — Ambulatory Visit (HOSPITAL_COMMUNITY): Payer: Medicare Other

## 2018-01-27 DIAGNOSIS — M6281 Muscle weakness (generalized): Secondary | ICD-10-CM

## 2018-01-27 DIAGNOSIS — R2689 Other abnormalities of gait and mobility: Secondary | ICD-10-CM

## 2018-01-27 DIAGNOSIS — M79662 Pain in left lower leg: Secondary | ICD-10-CM

## 2018-01-27 NOTE — Therapy (Signed)
[facility] [place], [place], [address] Phone: [phone]   Fax:  [phone]  Physical Therapy Treatment  Patient Details  Name: [name] MRN: [id number] Date of Birth: [birth year] Referring Provider: Dr. Heath Lark   Encounter Date: 01/27/2018  PT End of Session - 01/27/18 1033    Visit Number  14    Number of Visits  18    Date for PT Re-Evaluation  02/03/18    Authorization Type  Medicare    Authorization Time Period  12/23/17 to 02/03/18    PT Start Time  1032    PT Stop Time  1115    PT Time Calculation (min)  43 min    Equipment Utilized During Treatment  Oxygen    Activity Tolerance  Patient tolerated treatment well;Patient limited by fatigue    Behavior During Therapy  [facility] for tasks assessed/performed       Past Medical History:  Diagnosis Date  . COPD (chronic obstructive pulmonary disease) ([place])   . Depression   . Diverticulosis   . Family hx of colon cancer   . Fibromyalgia   . GERD (gastroesophageal reflux disease)   . Hemorrhoids   . Hiatal hernia   . History of shingles   . Hypothyroidism   . IBS (irritable bowel syndrome)   . Lymphocytic colitis   . Osteoporosis   . Ovarian cancer ([place])   . Paroxysmal atrial fibrillation (HCC)   . Psoriatic arthritis ([place])   . Rectal polyp   . Schatzki's ring   . Uterine polyp     Past Surgical History:  Procedure Laterality Date  . CHOLECYSTECTOMY  Dec 04, 2010  . COLONOSCOPY    . FEMUR IM NAIL Left 12/11/2013   Procedure: INTRAMEDULLARY (IM) NAIL FEMORAL;  Surgeon: [name], MD;  Location: [facility];  Service: Orthopedics;  Laterality: Left;  . FRACTURE SURGERY Left 12/11/2013   hip  . IR FLUORO GUIDE PORT INSERTION RIGHT  07/22/2017  . IR PARACENTESIS  07/12/2017  . IR US GUIDE VASC ACCESS RIGHT  07/22/2017  . KNEE SURGERY Left    x 2  . TOTAL KNEE REVISION  08/06/2012   Procedure: TOTAL KNEE REVISION;  Surgeon: [name], MD;   Location: [facility];  Service: Orthopedics;  Laterality: Left;  Left Total Knee Arthroplasty Revision  . Uterine polypectomy      There were no vitals filed for this visit.  Subjective Assessment - 01/27/18 1033    Subjective  Pt reports that she had a good Easter. She states that her buttocks are just a little sore today at about 2-3/10. She states that last night was the first night her buttock pain didn't wake her up.    Limitations  Walking;Standing    How long can you sit comfortably?  not limited    How long can you stand comfortably?  a few mins    How long can you walk comfortably?  254f or > at the nursing home    Patient Stated Goals  Would like to come to clinic to build up her strength and endurance    Currently in Pain?  Yes    Pain Score  3     Pain Location  Buttocks    Pain Orientation  Right;Left    Pain Descriptors / Indicators  Aching;Dull    Pain Type  Chronic pain    Pain Onset  In the past 7 days  Pain Frequency  Constant    Aggravating Factors   nothing    Pain Relieving Factors  activity    Effect of Pain on Daily Activities  decreased ability to complete ADLs.            [facility] Adult PT Treatment/Exercise - 01/27/18 0001      Knee/Hip Exercises: Standing   Rocker Board  1 minute;Limitations    Rocker Board Limitations  then attempting to balance in middle x1 mins total for R/L and A/P    Gait Training  gait wtih SPC x123f, x137f cues for increased step length on RLE    Other Standing Knee Exercises  sidestepping on blue line x2RT       Balance Exercises - 01/27/18 1108      Balance Exercises: Standing   SLS  Eyes open;Solid surface;Intermittent upper extremity support;5 reps;10 secs BLE    Tandem Gait  Forward;Retro;Intermittent upper extremity support;3 reps in // bars         PT Education - 01/27/18 1033    Education provided  Yes    Education Details  [facility]  Person(s) Educated  Patient    Methods  Explanation;Demonstration     Comprehension  Verbalized understanding;Returned demonstration       PT Short Term Goals - 01/15/18 1036      PT SHORT TERM GOAL #1   Title  Patient will ambulated >250 feet with rolling walker during the 3 MWT to improve her ability to ambulate further within her home before needing to rest.    Baseline  4/10: 28035fith RW    Time  3    Period  Weeks    Status  Achieved      PT SHORT TERM GOAL #2   Title  Patient with tolerate the standing position with upper extremity support for 5 minutes before needing to rest to improve her ability to perform standing functional activities and ADLs within her home environment.    Baseline  3/18: able to stand 5 mins with no UE support    Time  3    Period  Weeks    Status  Achieved      PT SHORT TERM GOAL #3   Title  Patient will increase MMT grades by 1/2 grade to show improvement in her strength and stability to perform functional activities.    Baseline  4/10: see MMT    Time  3    Period  Weeks    Status  Partially Met      PT SHORT TERM GOAL #4   Title  Patient will be able to stand on either foot for 10 seconds to exhibit improved balance and decreased risk for falls.    Baseline  4/10: 20sec R and 7 sec L with 1 fingertip assist throughout    Time  3    Period  Weeks    Status  On-going        PT Long Term Goals - 01/15/18 1036      PT LONG TERM GOAL #1   Title  Patient will ambulated >500 feet with rolling walker during the 3 MWT to improve her ability to ambulate further  in community to allow to her go enjoy going out for a meal.    Baseline  4/10: 280f21fraight without a break; 346ft67fal during 3MWT    Time  6    Period  Weeks    Status  On-going  PT LONG TERM GOAL #2   Title  Patient with tolerate the standing position with upper extremity support for 10 minutes before needing to rest to improve her ability to stand and have a conversation with a friend or relative.    Baseline  4/10: able to stand  statically for 10 mins this date with no UE support    Time  6    Period  Weeks    Status  Achieved      PT LONG TERM GOAL #3   Title  Patient will increase MMT grades by 1 grade to show continued improvement in her strength and stability to perform functional activities at home independently.    Baseline  4/10: see MMT    Time  6    Period  Weeks    Status  Partially Met      PT LONG TERM GOAL #4   Title  Patient will be able to stand on either foot for 15 seconds to exhibit improved balance and further decreased risk for falls.    Baseline  4/10: 20sec R and 7 sec L with 1 fingertip assist throughout    Time  6    Period  Weeks    Status  On-going      PT LONG TERM GOAL #5   Title  Pt will be able to perform 5xSTS in 12 sec or < with no UE in order to demo improved functional BLE strength and overall balance.    Baseline  4/10: 13.3sec, chair, no UE    Time  6    Period  Weeks    Status  On-going      PT LONG TERM GOAL #6   Title  Pt will report requiring assistance for ADLs and IADLs at home 25% of the time or < to demonstrate improved overall functional strength, endurance, and balance in order to promote independence at home.    Baseline  4/10: feels that her nurse aide and dtr provide about 25% assistance for ADLs and IADLs    Time  6    Period  Weeks    Status  Achieved            Plan - 01/27/18 1113    Clinical Impression Statement  Continued with established POC working on functional strength, gait, and overall balance. She required slightly more seated rest breaks this date due to increased SOB, which she attributes to the pollen/allergies as she was outside for a while yesterday. Pt noted to have decreased step length with RLE so cued pt to correct and she was able to demo understanding. Progressed balance activities to include rockerboard (maintaining balance in middle) and SLS. Continued to monitor pt's HR and O2 during session and her HR remained below 127bpm  with O2 staying around 98%, with it getting down to 93% during first bout of ambulation with SPC, however, it quickly recovered. Continue POC as planned, progressing as able.     Rehab Potential  Good    PT Frequency  3x / week    PT Duration  6 weeks    PT Treatment/Interventions  ADLs/Self Care Home Management;Gait training;Stair training;Functional mobility training;Therapeutic activities;Patient/family education;Balance training;Neuromuscular re-education;Therapeutic exercise;Manual techniques;Energy conservation    PT Next Visit Plan  continue gait with SPC for endurance training; progress balance next session; stregnthening and endurance activities for hip/glutes strength; Allow adequate rest breaks for patient, monitor HR; continue manual for gluteal restrictions PRN    PT Home Exercise  Plan  intial - lap walking at home; supine bridges; 3/18: supine clams with RTB; 3/25: SAQ; 4/5: standing abd, marching, LAQ, knee flexion; 4/15: supine figure 4, supine SKTC; 4/17: sidelying hip abd    Consulted and Agree with Plan of Care  Patient       Patient will benefit from skilled therapeutic intervention in order to improve the following deficits and impairments:  Abnormal gait, Pain, Decreased mobility, Decreased coordination, Decreased activity tolerance, Decreased endurance, Decreased strength, Decreased balance, Difficulty walking  Visit Diagnosis: Muscle weakness (generalized)  Pain in left lower leg  Other abnormalities of gait and mobility     Problem List Patient Active Problem List   Diagnosis Date Noted  . Thrombocytopenia ([place]) 01/03/2018  . Anemia, chronic disease 12/13/2017  . Mild protein-calorie malnutrition ([place]) 12/13/2017  . Peripheral neuropathy due to chemotherapy ([place]) 12/13/2017  . S/P thoracentesis   . Atrial fibrillation with RVR ([place]) 11/08/2017  . HCAP (healthcare-associated pneumonia) 11/08/2017  . Acute on chronic diastolic heart failure ([place]) 08/19/2017  .  Pancytopenia, acquired ([place]) 08/15/2017  . Protein-calorie malnutrition, moderate ([place]) 07/30/2017  . Generalized weakness 07/30/2017  . Antineoplastic chemotherapy induced pancytopenia ([place]) 07/20/2017  . Ovarian CA, right ([place]) 07/18/2017  . PNA (pneumonia) 07/07/2017  . Ascites 06/30/2017  . Ascites, malignant 06/30/2017  . Right ovarian cyst 01/18/2017  . Elevated CA-125 01/18/2017  . Depression 12/23/2015  . Peripheral edema 08/23/2015  . Fracture of hip, left, closed ([place]) 12/08/2013  . Hip fracture ([place]) 12/08/2013  . PAD (peripheral artery disease) ([place]) 11/25/2013  . Encounter for therapeutic drug monitoring 11/09/2013  . Atrial fibrillation ([place]) 05/07/2013  . Cough 03/20/2013  . Total knee replacement status 10/07/2012  . Knee pain 10/07/2012  . Knee stiffness 10/07/2012  . Tachycardia 09/17/2012  . Chest pain 09/17/2012  . Difficulty in walking(719.7) 09/16/2012  . Muscle weakness (generalized) 09/16/2012  . Postop Acute blood loss anemia 08/08/2012  . Instability of prosthetic knee ([place]) 08/06/2012  . Bloating 02/14/2012  . Upper abdominal pain 02/14/2012  . Allergic rhinitis, seasonal 02/15/2011  . COLITIS 02/20/2010  . Diarrhea 01/02/2010  . ABDOMINAL PAIN -GENERALIZED 01/02/2010  . PERSONAL HX COLONIC POLYPS 01/02/2010  . HYPOTHYROIDISM 12/28/2009  . COPD (chronic obstructive pulmonary disease) ([place]) 12/28/2009  . ARTHRITIS 12/28/2009        Geraldine Solar PT, DPT  [facility] [address] [place], [place], [address] Phone: [phone]   Fax:  [phone]  Name: [name] MRN: [id number] Date of Birth: [birth year]

## 2018-01-29 ENCOUNTER — Ambulatory Visit (HOSPITAL_COMMUNITY): Payer: Medicare Other

## 2018-01-29 DIAGNOSIS — M6281 Muscle weakness (generalized): Secondary | ICD-10-CM | POA: Diagnosis not present

## 2018-01-29 DIAGNOSIS — M79662 Pain in left lower leg: Secondary | ICD-10-CM

## 2018-01-29 DIAGNOSIS — R2689 Other abnormalities of gait and mobility: Secondary | ICD-10-CM

## 2018-01-29 NOTE — Therapy (Signed)
[facility] [place], [place], [address] Phone: [phone]   Fax:  [phone]  Physical Therapy Treatment  Patient Details  Name: [name] MRN: [id number] Date of Birth: Jun 13, 1933 Referring Provider: Dr. Heath Lark   Encounter Date: 01/29/2018  PT End of Session - 01/29/18 1030    Visit Number  15    Number of Visits  18    Date for PT Re-Evaluation  02/03/18    Authorization Type  Medicare    Authorization Time Period  12/23/17 to 02/03/18    PT Start Time  1029    PT Stop Time  1111    PT Time Calculation (min)  42 min    Equipment Utilized During Treatment  Oxygen    Activity Tolerance  Patient tolerated treatment well;Patient limited by fatigue    Behavior During Therapy  [facility] for tasks assessed/performed       Past Medical History:  Diagnosis Date  . COPD (chronic obstructive pulmonary disease) ([place])   . Depression   . Diverticulosis   . Family hx of colon cancer   . Fibromyalgia   . GERD (gastroesophageal reflux disease)   . Hemorrhoids   . Hiatal hernia   . History of shingles   . Hypothyroidism   . IBS (irritable bowel syndrome)   . Lymphocytic colitis   . Osteoporosis   . Ovarian cancer ([place])   . Paroxysmal atrial fibrillation (HCC)   . Psoriatic arthritis ([place])   . Rectal polyp   . Schatzki's ring   . Uterine polyp     Past Surgical History:  Procedure Laterality Date  . CHOLECYSTECTOMY  Dec 04, 2010  . COLONOSCOPY    . FEMUR IM NAIL Left 12/11/2013   Procedure: INTRAMEDULLARY (IM) NAIL FEMORAL;  Surgeon: [name], MD;  [facility];  Service: Orthopedics;  Laterality: Left;  . FRACTURE SURGERY Left 12/11/2013   hip  . IR FLUORO GUIDE PORT INSERTION RIGHT  07/22/2017  . IR PARACENTESIS  07/12/2017  . IR US GUIDE VASC ACCESS RIGHT  07/22/2017  . KNEE SURGERY Left    x 2  . TOTAL KNEE REVISION  08/06/2012   Procedure: TOTAL KNEE REVISION;  Surgeon: [name], MD;   [facility];  Service: Orthopedics;  Laterality: Left;  Left Total Knee Arthroplasty Revision  . Uterine polypectomy      There were no vitals filed for this visit.  Subjective Assessment - 01/29/18 1030    Subjective  Pt states that her hip joints and shoulders are sore still. She thinks it's the psoriatic arthritis still acting up.     Limitations  Walking;Standing    How long can you sit comfortably?  not limited    How long can you stand comfortably?  a few mins    How long can you walk comfortably?  254f or > at the nursing home    Patient Stated Goals  Would like to come to clinic to build up her strength and endurance    Currently in Pain?  Yes    Pain Score  4     Pain Location  Buttocks    Pain Orientation  Right;Left    Pain Descriptors / Indicators  Aching;Dull    Pain Type  Chronic pain    Pain Onset  In the past 7 days    Pain Frequency  Constant    Aggravating Factors   nothing  Pain Relieving Factors  activity    Effect of Pain on Daily Activities  decreased ability to complete ADLs            [facility] Adult PT Treatment/Exercise - 01/29/18 0001      Knee/Hip Exercises: Standing   Wall Squat  10 reps    Lunge Walking - Round Trips  wall sits 5x5" holds    Gait Training  gait with SPC x147f straight    Other Standing Knee Exercises  sidestepping blue line with bil 2# ankle weights x2RT       Balance Exercises - 01/29/18 1055      Balance Exercises: Standing   Rockerboard  Anterior/posterior;Lateral;Other time (comment);Other (comment) balancing in middle x2 mins each direction    Tandem Gait  Forward;Intermittent upper extremity support;Foam/compliant surface;3 reps in // bars           PT Education - 01/29/18 1035    Education provided  Yes    Education Details  gait with SPC, exercise technqiue, will reassess enxt visit     Person(s) Educated  Patient    Methods  Explanation;Demonstration    Comprehension  Verbalized  understanding;Returned demonstration       PT Short Term Goals - 01/15/18 1036      PT SHORT TERM GOAL #1   Title  Patient will ambulated >250 feet with rolling walker during the 3 MWT to improve her ability to ambulate further within her home before needing to rest.    Baseline  4/10: 2822fwith RW    Time  3    Period  Weeks    Status  Achieved      PT SHORT TERM GOAL #2   Title  Patient with tolerate the standing position with upper extremity support for 5 minutes before needing to rest to improve her ability to perform standing functional activities and ADLs within her home environment.    Baseline  3/18: able to stand 5 mins with no UE support    Time  3    Period  Weeks    Status  Achieved      PT SHORT TERM GOAL #3   Title  Patient will increase MMT grades by 1/2 grade to show improvement in her strength and stability to perform functional activities.    Baseline  4/10: see MMT    Time  3    Period  Weeks    Status  Partially Met      PT SHORT TERM GOAL #4   Title  Patient will be able to stand on either foot for 10 seconds to exhibit improved balance and decreased risk for falls.    Baseline  4/10: 20sec R and 7 sec L with 1 fingertip assist throughout    Time  3    Period  Weeks    Status  On-going        PT Long Term Goals - 01/15/18 1036      PT LONG TERM GOAL #1   Title  Patient will ambulated >500 feet with rolling walker during the 3 MWT to improve her ability to ambulate further  in community to allow to her go enjoy going out for a meal.    Baseline  4/10: 28074ftraight without a break; 346f66ftal during 3MWT    Time  6    Period  Weeks    Status  On-going      PT LONG TERM GOAL #2   Title  Patient with tolerate the standing position with upper extremity support for 10 minutes before needing to rest to improve her ability to stand and have a conversation with a friend or relative.    Baseline  4/10: able to stand statically for 10 mins this date with  no UE support    Time  6    Period  Weeks    Status  Achieved      PT LONG TERM GOAL #3   Title  Patient will increase MMT grades by 1 grade to show continued improvement in her strength and stability to perform functional activities at home independently.    Baseline  4/10: see MMT    Time  6    Period  Weeks    Status  Partially Met      PT LONG TERM GOAL #4   Title  Patient will be able to stand on either foot for 15 seconds to exhibit improved balance and further decreased risk for falls.    Baseline  4/10: 20sec R and 7 sec L with 1 fingertip assist throughout    Time  6    Period  Weeks    Status  On-going      PT LONG TERM GOAL #5   Title  Pt will be able to perform 5xSTS in 12 sec or < with no UE in order to demo improved functional BLE strength and overall balance.    Baseline  4/10: 13.3sec, chair, no UE    Time  6    Period  Weeks    Status  On-going      PT LONG TERM GOAL #6   Title  Pt will report requiring assistance for ADLs and IADLs at home 25% of the time or < to demonstrate improved overall functional strength, endurance, and balance in order to promote independence at home.    Baseline  4/10: feels that her nurse aide and dtr provide about 25% assistance for ADLs and IADLs    Time  6    Period  Weeks    Status  Achieved            Plan - 01/29/18 1110    Clinical Impression Statement  Continued with established POC focusing on gluteal strengthening, endurance, and overall balance. Able to progress pt to wall sits, sidestepping with weights, and tandem gait on foam this date. No reports of pain, just visible and verbalized BLE fatigue. HR remained below 127bpm and O2 > 93% throughout session. Gait was improved this date, but still slightly deficient. Pt progressing nicely towards goals. She's due for reassessment next visit since she won't be here on Monday 02/03/18.     Rehab Potential  Good    PT Frequency  3x / week    PT Duration  6 weeks    PT  Treatment/Interventions  ADLs/Self Care Home Management;Gait training;Stair training;Functional mobility training;Therapeutic activities;Patient/family education;Balance training;Neuromuscular re-education;Therapeutic exercise;Manual techniques;Energy conservation    PT Next Visit Plan  reassessment; continue gait with SPC for endurance training; progress balance next session; stregnthening and endurance activities for hip/glutes strength; Allow adequate rest breaks for patient, monitor HR; continue manual for gluteal restrictions PRN    PT Home Exercise Plan  intial - lap walking at home; supine bridges; 3/18: supine clams with RTB; 3/25: SAQ; 4/5: standing abd, marching, LAQ, knee flexion; 4/15: supine figure 4, supine SKTC; 4/17: sidelying hip abd    Consulted and Agree with Plan of Care  Patient  Patient will benefit from skilled therapeutic intervention in order to improve the following deficits and impairments:  Abnormal gait, Pain, Decreased mobility, Decreased coordination, Decreased activity tolerance, Decreased endurance, Decreased strength, Decreased balance, Difficulty walking  Visit Diagnosis: Muscle weakness (generalized)  Pain in left lower leg  Other abnormalities of gait and mobility     Problem List Patient Active Problem List   Diagnosis Date Noted  . Thrombocytopenia ([place]) 01/03/2018  . Anemia, chronic disease 12/13/2017  . Mild protein-calorie malnutrition ([place]) 12/13/2017  . Peripheral neuropathy due to chemotherapy ([place]) 12/13/2017  . S/P thoracentesis   . Atrial fibrillation with RVR ([place]) 11/08/2017  . HCAP (healthcare-associated pneumonia) 11/08/2017  . Acute on chronic diastolic heart failure ([place]) 08/19/2017  . Pancytopenia, acquired ([place]) 08/15/2017  . Protein-calorie malnutrition, moderate ([place]) 07/30/2017  . Generalized weakness 07/30/2017  . Antineoplastic chemotherapy induced pancytopenia ([place]) 07/20/2017  . Ovarian CA, right ([place]) 07/18/2017   . PNA (pneumonia) 07/07/2017  . Ascites 06/30/2017  . Ascites, malignant 06/30/2017  . Right ovarian cyst 01/18/2017  . Elevated CA-125 01/18/2017  . Depression 12/23/2015  . Peripheral edema 08/23/2015  . Fracture of hip, left, closed ([place]) 12/08/2013  . Hip fracture ([place]) 12/08/2013  . PAD (peripheral artery disease) ([place]) 11/25/2013  . Encounter for therapeutic drug monitoring 11/09/2013  . Atrial fibrillation ([place]) 05/07/2013  . Cough 03/20/2013  . Total knee replacement status 10/07/2012  . Knee pain 10/07/2012  . Knee stiffness 10/07/2012  . Tachycardia 09/17/2012  . Chest pain 09/17/2012  . Difficulty in walking(719.7) 09/16/2012  . Muscle weakness (generalized) 09/16/2012  . Postop Acute blood loss anemia 08/08/2012  . 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 Bloating 02/14/2012  . Upper abdominal pain 02/14/2012  . Allergic rhinitis, seasonal 02/15/2011  . COLITIS 02/20/2010  . Diarrhea 01/02/2010  . ABDOMINAL PAIN -GENERALIZED 01/02/2010  . PERSONAL HX COLONIC POLYPS 01/02/2010  . HYPOTHYROIDISM 12/28/2009  . COPD (chronic obstructive pulmonary disease) ([place]) 12/28/2009  . ARTHRITIS 12/28/2009        Geraldine Solar PT, DPT  [facility] [address] [place], [place], [address] Phone: [phone]   Fax:  [phone]  Name: [name] MRN: [id number] Date of Birth: [birth year]

## 2018-01-31 ENCOUNTER — Ambulatory Visit (HOSPITAL_COMMUNITY): Payer: Medicare Other

## 2018-01-31 ENCOUNTER — Encounter (HOSPITAL_COMMUNITY): Payer: Self-pay

## 2018-01-31 DIAGNOSIS — M6281 Muscle weakness (generalized): Secondary | ICD-10-CM | POA: Diagnosis not present

## 2018-01-31 DIAGNOSIS — R2689 Other abnormalities of gait and mobility: Secondary | ICD-10-CM

## 2018-01-31 DIAGNOSIS — M79662 Pain in left lower leg: Secondary | ICD-10-CM

## 2018-01-31 NOTE — Therapy (Signed)
[place], [place], [address] Phone: [phone]   Fax:  [phone]    Progress Note Reporting Period 01/15/18 to 01/31/18  See note below for Objective Data and Assessment of Progress/Goals.     Physical Therapy Treatment/Reassessment  Patient Details  Name: [name] MRN: [id number] Date of Birth: [birth year] Referring Provider: Dr. Heath Lark   Encounter Date: 01/31/2018  PT End of Session - 01/31/18 1033    Visit Number  16    Number of Visits  30    Date for PT Re-Evaluation  02/28/18    Authorization Type  Medicare    Authorization Time Period  12/23/17 to 02/03/18; NEW: 01/31/18 to 02/28/18    PT Start Time  1032    PT Stop Time  1110    PT Time Calculation (min)  38 min    Equipment Utilized During Treatment  --    Activity Tolerance  Patient tolerated treatment well;Patient limited by fatigue    Behavior During Therapy  [facility] for tasks assessed/performed       Past Medical History:  Diagnosis Date  . COPD (chronic obstructive pulmonary disease) ([place])   . Depression   . Diverticulosis   . Family hx of colon cancer   . Fibromyalgia   . GERD (gastroesophageal reflux disease)   . Hemorrhoids   . Hiatal hernia   . History of shingles   . Hypothyroidism   . IBS (irritable bowel syndrome)   . Lymphocytic colitis   . Osteoporosis   . Ovarian cancer ([place])   . Paroxysmal atrial fibrillation ([facility])   . Psoriatic arthritis ([place])   . Rectal polyp   . Schatzki's ring   . Uterine polyp     Past Surgical History:  Procedure Laterality Date  . CHOLECYSTECTOMY  Dec 04, 2010  . COLONOSCOPY    . FEMUR IM NAIL Left 12/11/2013   Procedure: INTRAMEDULLARY (IM) NAIL FEMORAL;  Surgeon: [name], MD;  Location: [facility];  Service: Orthopedics;  Laterality: Left;  . FRACTURE SURGERY Left 12/11/2013   hip  . IR FLUORO GUIDE PORT INSERTION RIGHT  07/22/2017  . IR PARACENTESIS  07/12/2017  . IR US GUIDE  VASC ACCESS RIGHT  07/22/2017  . KNEE SURGERY Left    x 2  . TOTAL KNEE REVISION  08/06/2012   Procedure: TOTAL KNEE REVISION;  Surgeon: [name], MD;  Location: [facility];  Service: Orthopedics;  Laterality: Left;  Left Total Knee Arthroplasty Revision  . Uterine polypectomy      There were no vitals filed for this visit.  Subjective Assessment - 01/31/18 1033    Subjective  Pt states that she still woke up with buttock pain and took a pain pill. She states that the stretches still really help her pain.     Limitations  Walking;Standing    How long can you sit comfortably?  not limited    How long can you stand comfortably?  a few mins    How long can you walk comfortably?  89f or > at the nursing home    Patient Stated Goals  Would like to come to clinic to build up her strength and endurance    Currently in Pain?  Yes    Pain Score  2     Pain Location  Buttocks    Pain Orientation  Right;Left    Pain Descriptors / Indicators  Aching;Dull    Pain  Type  Chronic pain    Pain Onset  In the past 7 days    Pain Frequency  Constant    Aggravating Factors   nothing    Pain Relieving Factors  activity    Effect of Pain on Daily Activities  decreased ability to complete ADLs           [facility] PT Assessment - 01/31/18 0001      Assessment   Medical Diagnosis  Generalized Weakness    Referring Provider  Dr. Heath Lark    Prior Therapy  pt d/c from SNF on 12/19/17      Functional Tests   Functional tests  Sit to Stand      Sit to Stand   Comments  30sec chair rise test: 10      Strength   Right Hip Flexion  4+/5 was 4+    Right Hip Extension  4-/5 was 4-    Right Hip ABduction  4+/5 was 4    Left Hip Flexion  4+/5 was 4+    Left Hip Extension  3+/5 was 3+    Left Hip ABduction  4/5 was 4    Left Knee Flexion  4+/5 was 4+    Left Knee Extension  5/5 was 4+    Right Ankle Dorsiflexion  4+/5 was 4+    Left Ankle Dorsiflexion  4+/5 was 4      Ambulation/Gait    Ambulation Distance (Feet)  340 Feet 3MWT    Assistive device  Rolling walker    Gait Comments  no standing rest breaks      Balance   Balance Assessed  Yes      Static Standing Balance   Static Standing - Balance Support  No upper extremity supported    Static Standing Balance -  Activities   Single Leg Stance - Right Leg;Single Leg Stance - Left Leg    Static Standing - Comment/# of Minutes  R: 8 sec, L: 6 sec      Standardized Balance Assessment   Standardized Balance Assessment  Five Times Sit to Stand    Five times sit to stand comments   11.8sec, chair, no UE was 13.3s         PT Short Term Goals - 01/31/18 1034      PT SHORT TERM GOAL #1   Title  Patient will ambulated >250 feet with rolling walker during the 3 MWT to improve her ability to ambulate further within her home before needing to rest.    Baseline  4/26: 89f, no rest breaks    Time  3    Period  Weeks    Status  Achieved      PT SHORT TERM GOAL #2   Title  Patient with tolerate the standing position with upper extremity support for 5 minutes before needing to rest to improve her ability to perform standing functional activities and ADLs within her home environment.    Baseline  3/18: able to stand 5 mins with no UE support    Time  3    Period  Weeks    Status  Achieved      PT SHORT TERM GOAL #3   Title  Patient will increase MMT grades by 1/2 grade to show improvement in her strength and stability to perform functional activities.    Baseline  4/26: has improved by at least 1/2 grade throughout since initial evaluation    Time  3  Period  Weeks    Status  Achieved      PT SHORT TERM GOAL #4   Title  Patient will be able to stand on either foot for 10 seconds to exhibit improved balance and decreased risk for falls.    Baseline  4/26: 8 sec on RLE and 6 sec on LLE with no UE    Time  3    Period  Weeks    Status  On-going        PT Long Term Goals - 01/31/18 1035      PT LONG TERM GOAL #1    Title  Patient will ambulated >500 feet with rolling walker during the 3 MWT to improve her ability to ambulate further  in community to allow to her go enjoy going out for a meal.    Baseline  4/26: 41f, no rest breaks    Time  6    Period  Weeks    Status  On-going      PT LONG TERM GOAL #2   Title  Patient with tolerate the standing position with upper extremity support for 10 minutes before needing to rest to improve her ability to stand and have a conversation with a friend or relative.    Baseline  4/10: able to stand statically for 10 mins this date with no UE support    Time  6    Period  Weeks    Status  Achieved      PT LONG TERM GOAL #3   Title  Patient will increase MMT grades by 1 grade to show continued improvement in her strength and stability to perform functional activities at home independently.    Baseline  4/26: see MMT    Time  6    Period  Weeks    Status  Partially Met      PT LONG TERM GOAL #4   Title  Patient will be able to stand on either foot for 15 seconds to exhibit improved balance and further decreased risk for falls.    Baseline  4/26: 8 sec on RLE and 6 sec on LLE with no UE    Time  6    Period  Weeks    Status  On-going      PT LONG TERM GOAL #5   Title  Pt will be able to perform 5xSTS in 12 sec or < with no UE in order to demo improved functional BLE strength and overall balance.    Baseline  4/26: 11.8 sec, no UE    Time  6    Period  Weeks    Status  Achieved      Additional Long Term Goals   Additional Long Term Goals  Yes      PT LONG TERM GOAL #6   Title  Pt will report requiring assistance for ADLs and IADLs at home 25% of the time or < to demonstrate improved overall functional strength, endurance, and balance in order to promote independence at home.    Baseline  4/10: feels that her nurse aide and dtr provide about 25% assistance for ADLs and IADLs    Time  6    Period  Weeks    Status  Achieved      PT LONG TERM GOAL #7    Title  Pt will be able to perform 13 STS during the 30 sec chair rise test in order to demo improved functional BLE strength and  further decrease risk for falls.    Time  4    Period  Weeks    Status  New    Target Date  02/28/18      PT LONG TERM GOAL #8   Title  Pt will report being able to stand and ambulate in her kitchen for 20 mins without a rest break to allow her to make a meal with greater ease and maximize function and independence at home.     Time  4    Period  Weeks    Status  New    Target Date  02/28/18            Plan - 01/31/18 1131    Clinical Impression Statement  PT performed reassessment this date since this was pt's last scheduled visit and her POC ends on 02/03/18. Pt has made great progress overall towards her goals since her initial evaluation. Her strength, balance, and endurance have all improved AEB improvements in MMT, 5xSTS, and 3MWT. Her MMT has improved by at least 1/2 grade, she performed the 5xSTS in <12 sec, and she was able to ambulate for the entire 3MWT, something she has not yet been able to do. Her balance is still deficient, though it is steadily improving as she was able to stand for 8 and 6 sec on R and L leg respectively without UE support. She is still deficient in her BLE endurance as she was only able to ambulate 343f during the 3MWT; norms for her aged-matched peers is 6473fin 3 minutes. Overall, she has made great progress but would continue to benefit from skilled PT services in order to further address muscular endurance, balance, and functional strength so as to increase independence at home and maximize her ability to access the community with greater ease. Goals were updated this date to allow for more progress to be made while continuing to work towards remaining un-met goals.     Rehab Potential  Good    PT Frequency  3x / week    PT Duration  4 weeks    PT Treatment/Interventions  ADLs/Self Care Home Management;Gait training;Stair  training;Functional mobility training;Therapeutic activities;Patient/family education;Balance training;Neuromuscular re-education;Therapeutic exercise;Manual techniques;Energy conservation    PT Next Visit Plan  continue gait with SPC for endurance training; progress balance next session; stregnthening and endurance activities for hip/glutes strength; Nustep PRN for musculature endurance; Allow adequate rest breaks for patient, monitor HR; continue manual for gluteal restrictions PRN    PT Home Exercise Plan  intial - lap walking at home; supine bridges; 3/18: supine clams with RTB; 3/25: SAQ; 4/5: standing abd, marching, LAQ, knee flexion; 4/15: supine figure 4, supine SKTC; 4/17: sidelying hip abd    Consulted and Agree with Plan of Care  Patient       Patient will benefit from skilled therapeutic intervention in order to improve the following deficits and impairments:  Abnormal gait, Pain, Decreased mobility, Decreased coordination, Decreased activity tolerance, Decreased endurance, Decreased strength, Decreased balance, Difficulty walking  Visit Diagnosis: Muscle weakness (generalized) - Plan: PT plan of care cert/re-cert  Pain in left lower leg - Plan: PT plan of care cert/re-cert  Other abnormalities of gait and mobility - Plan: PT plan of care cert/re-cert     Problem List Patient Active Problem List   Diagnosis Date Noted  . Thrombocytopenia ([facility]03/29/2019  . Anemia, chronic disease 12/13/2017  . Mild protein-calorie malnutrition ([facility]03/05/2018  . Peripheral neuropathy due to chemotherapy ([place]03/05/2018  .  S/P thoracentesis   . Atrial fibrillation with RVR ([place]) 11/08/2017  . HCAP (healthcare-associated pneumonia) 11/08/2017  . Acute on chronic diastolic heart failure ([place]) 08/19/2017  . Pancytopenia, acquired ([place]) 08/15/2017  . Protein-calorie malnutrition, moderate ([place]) 07/30/2017  . Generalized weakness 07/30/2017  . Antineoplastic chemotherapy induced pancytopenia  ([place]) 07/20/2017  . Ovarian CA, right ([place]) 07/18/2017  . PNA (pneumonia) 07/07/2017  . Ascites 06/30/2017  . Ascites, malignant 06/30/2017  . Right ovarian cyst 01/18/2017  . Elevated CA-125 01/18/2017  . Depression 12/23/2015  . Peripheral edema 08/23/2015  . Fracture of hip, left, closed ([place]) 12/08/2013  . Hip fracture ([place]) 12/08/2013  . PAD (peripheral artery disease) ([place]) 11/25/2013  . Encounter for therapeutic drug monitoring 11/09/2013  . Atrial fibrillation ([place]) 05/07/2013  . Cough 03/20/2013  . Total knee replacement status 10/07/2012  . Knee pain 10/07/2012  . Knee stiffness 10/07/2012  . Tachycardia 09/17/2012  . Chest pain 09/17/2012  . Difficulty in walking(719.7) 09/16/2012  . Muscle weakness (generalized) 09/16/2012  . Postop Acute blood loss anemia 08/08/2012  . 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 Bloating 02/14/2012  . Upper abdominal pain 02/14/2012  . Allergic rhinitis, seasonal 02/15/2011  . COLITIS 02/20/2010  . Diarrhea 01/02/2010  . ABDOMINAL PAIN -GENERALIZED 01/02/2010  . PERSONAL HX COLONIC POLYPS 01/02/2010  . HYPOTHYROIDISM 12/28/2009  . COPD (chronic obstructive pulmonary disease) ([place]) 12/28/2009  . ARTHRITIS 12/28/2009         Geraldine Solar PT, DPT  [place] [address] [place], [place], [address] Phone: [phone]   Fax:  [phone]  Name: [name] MRN: [id number] Date of Birth: [birth year]

## 2018-02-03 ENCOUNTER — Encounter (HOSPITAL_COMMUNITY): Payer: Self-pay

## 2018-02-03 ENCOUNTER — Ambulatory Visit (HOSPITAL_COMMUNITY): Payer: Medicare Other

## 2018-02-03 ENCOUNTER — Other Ambulatory Visit: Payer: Self-pay

## 2018-02-03 DIAGNOSIS — M6281 Muscle weakness (generalized): Secondary | ICD-10-CM

## 2018-02-03 DIAGNOSIS — R2689 Other abnormalities of gait and mobility: Secondary | ICD-10-CM

## 2018-02-03 DIAGNOSIS — M79662 Pain in left lower leg: Secondary | ICD-10-CM

## 2018-02-03 NOTE — Therapy (Signed)
[place], [place], [address] Phone: [phone]   Fax:  [phone]  Physical Therapy Treatment  Patient Details  Name: [name] MRN: [id number] Date of Birth: [birth year] Referring Provider: Dr. Heath Lark   Encounter Date: 02/03/2018  PT End of Session - 02/03/18 1327    Visit Number  17    Number of Visits  30    Date for PT Re-Evaluation  02/28/18    Authorization Type  Medicare    Authorization Time Period  12/23/17 to 02/03/18; NEW: 01/31/18 to 02/28/18    PT Start Time  1304    PT Stop Time  1345    PT Time Calculation (min)  41 min    Activity Tolerance  Patient tolerated treatment well;Patient limited by fatigue    Behavior During Therapy  [facility] for tasks assessed/performed       Past Medical History:  Diagnosis Date  . COPD (chronic obstructive pulmonary disease) ([place])   . Depression   . Diverticulosis   . Family hx of colon cancer   . Fibromyalgia   . GERD (gastroesophageal reflux disease)   . Hemorrhoids   . Hiatal hernia   . History of shingles   . Hypothyroidism   . IBS (irritable bowel syndrome)   . Lymphocytic colitis   . Osteoporosis   . Ovarian cancer ([place])   . Paroxysmal atrial fibrillation ([facility])   . Psoriatic arthritis ([place])   . Rectal polyp   . Schatzki's ring   . Uterine polyp     Past Surgical History:  Procedure Laterality Date  . CHOLECYSTECTOMY  Dec 04, 2010  . COLONOSCOPY    . FEMUR IM NAIL Left 12/11/2013   Procedure: INTRAMEDULLARY (IM) NAIL FEMORAL;  Surgeon: [name], MD;  Location: [facility];  Service: Orthopedics;  Laterality: Left;  . FRACTURE SURGERY Left 12/11/2013   hip  . IR FLUORO GUIDE PORT INSERTION RIGHT  07/22/2017  . IR PARACENTESIS  07/12/2017  . IR US GUIDE VASC ACCESS RIGHT  07/22/2017  . KNEE SURGERY Left    x 2  . TOTAL KNEE REVISION  08/06/2012   Procedure: TOTAL KNEE REVISION;  Surgeon: [name], MD;  Location: [facility];   Service: Orthopedics;  Laterality: Left;  Left Total Knee Arthroplasty Revision  . Uterine polypectomy      There were no vitals filed for this visit.  Subjective Assessment - 02/03/18 1444    Subjective  Patient reports she is still having pain in her legs and hips and joints. She believes she has had a flare up of her psoriatic arthritis and reports it is difficult to manage the pain because she can only take certain medications due to being on chemotherapy. She states her pain kept her up at night and she is very tired because she did not sleep well. he has her next chemo treatment tomorrow.     Limitations  Walking;Standing    How long can you sit comfortably?  not limited    How long can you stand comfortably?  a few mins    How long can you walk comfortably?  210f or > at the nursing home    Patient Stated Goals  Would like to come to clinic to build up her strength and endurance    Currently in Pain?  Yes    Pain Score  4     Pain Location  Leg    Pain  Orientation  Posterior;Anterior;Right;Left;Proximal    Pain Descriptors / Indicators  Aching;Dull;Sore    Pain Type  Chronic pain    Pain Onset  Other (comment) pt thinks it is a flare up of psoriatic arthritis    Pain Frequency  Intermittent        [facility] Adult PT Treatment/Exercise - 02/03/18 0001      Ambulation/Gait   Ambulation Distance (Feet)  180 Feet    Assistive device  Rolling walker    Gait Pattern  Step-through pattern;Decreased hip/knee flexion - right;Decreased hip/knee flexion - left;Decreased stride length;Trunk flexed    Ambulation Surface  Level        Balance Exercises - 02/03/18 1312      Balance Exercises: Standing   Tandem Stance  Foam/compliant surface;Intermittent upper extremity support;3 reps;15 secs 3 reps bil LE    Standing, One Foot on a Step  Eyes open;4 inch;3 reps;15 secs 3 reps bil LE    Rockerboard  Anterior/posterior;Lateral;Other time (comment);Other (comment) balancing in middle x2 mins  each direction    Tandem Gait  Forward;Intermittent upper extremity support;Foam/compliant surface;3 reps    Retro Gait  Foam/compliant surface;3 reps;Upper extremity support in // bars    Step Over Hurdles / Cones  3RT in // bars with 2# weight on ankles, lateral stepping over 3x 6" hurdles        PT Education - 02/03/18 1345    Education provided  Yes    Education Details  Educatred on balance strategies for weight shifting AP and latearl to prevent LOB. Educated on pursed lip breathing.    Person(s) Educated  Patient    Methods  Explanation;Verbal cues;Tactile cues    Comprehension  Verbalized understanding;Returned demonstration       PT Short Term Goals - 01/31/18 1034      PT SHORT TERM GOAL #1   Title  Patient will ambulated >250 feet with rolling walker during the 3 MWT to improve her ability to ambulate further within her home before needing to rest.    Baseline  4/26: 89f, no rest breaks    Time  3    Period  Weeks    Status  Achieved      PT SHORT TERM GOAL #2   Title  Patient with tolerate the standing position with upper extremity support for 5 minutes before needing to rest to improve her ability to perform standing functional activities and ADLs within her home environment.    Baseline  3/18: able to stand 5 mins with no UE support    Time  3    Period  Weeks    Status  Achieved      PT SHORT TERM GOAL #3   Title  Patient will increase MMT grades by 1/2 grade to show improvement in her strength and stability to perform functional activities.    Baseline  4/26: has improved by at least 1/2 grade throughout since initial evaluation    Time  3    Period  Weeks    Status  Achieved      PT SHORT TERM GOAL #4   Title  Patient will be able to stand on either foot for 10 seconds to exhibit improved balance and decreased risk for falls.    Baseline  4/26: 8 sec on RLE and 6 sec on LLE with no UE    Time  3    Period  Weeks    Status  On-going  PT Long  Term Goals - 01/31/18 1035      PT LONG TERM GOAL #1   Title  Patient will ambulated >500 feet with rolling walker during the 3 MWT to improve her ability to ambulate further  in community to allow to her go enjoy going out for a meal.    Baseline  4/26: 89f, no rest breaks    Time  6    Period  Weeks    Status  On-going      PT LONG TERM GOAL #2   Title  Patient with tolerate the standing position with upper extremity support for 10 minutes before needing to rest to improve her ability to stand and have a conversation with a friend or relative.    Baseline  4/10: able to stand statically for 10 mins this date with no UE support    Time  6    Period  Weeks    Status  Achieved      PT LONG TERM GOAL #3   Title  Patient will increase MMT grades by 1 grade to show continued improvement in her strength and stability to perform functional activities at home independently.    Baseline  4/26: see MMT    Time  6    Period  Weeks    Status  Partially Met      PT LONG TERM GOAL #4   Title  Patient will be able to stand on either foot for 15 seconds to exhibit improved balance and further decreased risk for falls.    Baseline  4/26: 8 sec on RLE and 6 sec on LLE with no UE    Time  6    Period  Weeks    Status  On-going      PT LONG TERM GOAL #5   Title  Pt will be able to perform 5xSTS in 12 sec or < with no UE in order to demo improved functional BLE strength and overall balance.    Baseline  4/26: 11.8 sec, no UE    Time  6    Period  Weeks    Status  Achieved      Additional Long Term Goals   Additional Long Term Goals  Yes      PT LONG TERM GOAL #6   Title  Pt will report requiring assistance for ADLs and IADLs at home 25% of the time or < to demonstrate improved overall functional strength, endurance, and balance in order to promote independence at home.    Baseline  4/10: feels that her nurse aide and dtr provide about 25% assistance for ADLs and IADLs    Time  6    Period   Weeks    Status  Achieved      PT LONG TERM GOAL #7   Title  Pt will be able to perform 13 STS during the 30 sec chair rise test in order to demo improved functional BLE strength and further decrease risk for falls.    Time  4    Period  Weeks    Status  New    Target Date  02/28/18      PT LONG TERM GOAL #8   Title  Pt will report being able to stand and ambulate in her kitchen for 20 mins without a rest break to allow her to make a meal with greater ease and maximize function and independence at home.     Time  4  Period  Weeks    Status  New    Target Date  02/28/18        Plan - 02/03/18 1328    Clinical Impression Statement  Continued with current POC with a focus on balance training today. Patient reports she did not sleep well last night and was easily fatigued today requiring frequent seated rest breaks due to increased SOB. Patietn required cues throughout gait for increased hip/knee flexion bil and new exercise added today to improve hip strength during balance activity. Patient has greater difficulty with Lt SLS and with Lt foot back in tandem and continues to require cues for weight shifting to maintain midline posture on rocker board. Continued to monitor pt's HR and SpO2 during session and her HR remained below 128bpm with SpO2 between 94-98%. She will continue to benefit from skilled PT services to address endurance and balance impairments and improve functional mobility.    Rehab Potential  Good    PT Frequency  3x / week    PT Duration  4 weeks    PT Treatment/Interventions  ADLs/Self Care Home Management;Gait training;Stair training;Functional mobility training;Therapeutic activities;Patient/family education;Balance training;Neuromuscular re-education;Therapeutic exercise;Manual techniques;Energy conservation    PT Next Visit Plan  continue gait with SPC for endurance training; progress balance next session; stregnthening and endurance activities for hip/glutes strength;  Nustep PRN for musculature endurance; Allow adequate rest breaks for patient, monitor HR; continue manual for gluteal restrictions PRN    PT Home Exercise Plan  intial - lap walking at home; supine bridges; 3/18: supine clams with RTB; 3/25: SAQ; 4/5: standing abd, marching, LAQ, knee flexion; 4/15: supine figure 4, supine SKTC; 4/17: sidelying hip abd    Consulted and Agree with Plan of Care  Patient       Patient will benefit from skilled therapeutic intervention in order to improve the following deficits and impairments:  Abnormal gait, Pain, Decreased mobility, Decreased coordination, Decreased activity tolerance, Decreased endurance, Decreased strength, Decreased balance, Difficulty walking  Visit Diagnosis: Muscle weakness (generalized)  Pain in left lower leg  Other abnormalities of gait and mobility     Problem List Patient Active Problem List   Diagnosis Date Noted  . Thrombocytopenia ([place]) 01/03/2018  . Anemia, chronic disease 12/13/2017  . Mild protein-calorie malnutrition ([place]) 12/13/2017  . Peripheral neuropathy due to chemotherapy ([place]) 12/13/2017  . S/P thoracentesis   . Atrial fibrillation with RVR ([place]) 11/08/2017  . HCAP (healthcare-associated pneumonia) 11/08/2017  . Acute on chronic diastolic heart failure ([place]) 08/19/2017  . Pancytopenia, acquired ([place]) 08/15/2017  . Protein-calorie malnutrition, moderate ([place]) 07/30/2017  . Generalized weakness 07/30/2017  . Antineoplastic chemotherapy induced pancytopenia ([place]) 07/20/2017  . Ovarian CA, right ([place]) 07/18/2017  . PNA (pneumonia) 07/07/2017  . Ascites 06/30/2017  . Ascites, malignant 06/30/2017  . Right ovarian cyst 01/18/2017  . Elevated CA-125 01/18/2017  . Depression 12/23/2015  . Peripheral edema 08/23/2015  . Fracture of hip, left, closed ([place]) 12/08/2013  . Hip fracture ([place]) 12/08/2013  . PAD (peripheral artery disease) ([place]) 11/25/2013  . Encounter for therapeutic drug monitoring 11/09/2013  .  Atrial fibrillation ([place]) 05/07/2013  . Cough 03/20/2013  . Total knee replacement status 10/07/2012  . Knee pain 10/07/2012  . Knee stiffness 10/07/2012  . Tachycardia 09/17/2012  . Chest pain 09/17/2012  . Difficulty in walking(719.7) 09/16/2012  . Muscle weakness (generalized) 09/16/2012  . Postop Acute blood loss anemia 08/08/2012  . Instability of prosthetic knee ([place]) 08/06/2012  . Bloating 02/14/2012  .  Upper abdominal pain 02/14/2012  . Allergic rhinitis, seasonal 02/15/2011  . COLITIS 02/20/2010  . Diarrhea 01/02/2010  . ABDOMINAL PAIN -GENERALIZED 01/02/2010  . PERSONAL HX COLONIC POLYPS 01/02/2010  . HYPOTHYROIDISM 12/28/2009  . COPD (chronic obstructive pulmonary disease) ([place]) 12/28/2009  . ARTHRITIS 12/28/2009    Kipp Brood, PT, DPT Physical Therapist with [facility]  02/03/2018 3:14 PM    [facility] [address] [place], [place], [address] Phone: [phone]   Fax:  [phone]  Name: FLORENCE YEUNG MRN: [id number] Date of Birth: 03/15/1933

## 2018-02-04 ENCOUNTER — Inpatient Hospital Stay: Payer: Medicare Other

## 2018-02-04 ENCOUNTER — Inpatient Hospital Stay (HOSPITAL_BASED_OUTPATIENT_CLINIC_OR_DEPARTMENT_OTHER): Payer: Medicare Other | Admitting: Hematology and Oncology

## 2018-02-04 ENCOUNTER — Encounter: Payer: Self-pay | Admitting: Hematology and Oncology

## 2018-02-04 VITALS — BP 139/73 | HR 103 | Temp 98.4°F | Resp 18 | Ht 64.0 in | Wt 165.8 lb

## 2018-02-04 VITALS — HR 96

## 2018-02-04 DIAGNOSIS — C561 Malignant neoplasm of right ovary: Secondary | ICD-10-CM

## 2018-02-04 DIAGNOSIS — J449 Chronic obstructive pulmonary disease, unspecified: Secondary | ICD-10-CM | POA: Diagnosis not present

## 2018-02-04 DIAGNOSIS — R531 Weakness: Secondary | ICD-10-CM

## 2018-02-04 DIAGNOSIS — D61818 Other pancytopenia: Secondary | ICD-10-CM

## 2018-02-04 DIAGNOSIS — Z79899 Other long term (current) drug therapy: Secondary | ICD-10-CM

## 2018-02-04 DIAGNOSIS — Z5111 Encounter for antineoplastic chemotherapy: Secondary | ICD-10-CM

## 2018-02-04 DIAGNOSIS — J431 Panlobular emphysema: Secondary | ICD-10-CM

## 2018-02-04 DIAGNOSIS — Z9221 Personal history of antineoplastic chemotherapy: Secondary | ICD-10-CM

## 2018-02-04 LAB — CBC WITH DIFFERENTIAL (CANCER CENTER ONLY)
BASOS ABS: 0 10*3/uL (ref 0.0–0.1)
BASOS PCT: 1 %
EOS ABS: 0.1 10*3/uL (ref 0.0–0.5)
Eosinophils Relative: 1 %
HCT: 33 % — ABNORMAL LOW (ref 34.8–46.6)
HEMOGLOBIN: 11.1 g/dL — AB (ref 11.6–15.9)
Lymphocytes Relative: 28 %
Lymphs Abs: 1.3 10*3/uL (ref 0.9–3.3)
MCH: 33.3 pg (ref 25.1–34.0)
MCHC: 33.7 g/dL (ref 31.5–36.0)
MCV: 98.9 fL (ref 79.5–101.0)
Monocytes Absolute: 0.5 10*3/uL (ref 0.1–0.9)
Monocytes Relative: 11 %
NEUTROS ABS: 2.7 10*3/uL (ref 1.5–6.5)
NEUTROS PCT: 59 %
Platelet Count: 87 10*3/uL — ABNORMAL LOW (ref 145–400)
RBC: 3.34 MIL/uL — AB (ref 3.70–5.45)
RDW: 14.8 % — ABNORMAL HIGH (ref 11.2–14.5)
WBC: 4.6 10*3/uL (ref 3.9–10.3)

## 2018-02-04 LAB — CMP (CANCER CENTER ONLY)
ALBUMIN: 3.4 g/dL — AB (ref 3.5–5.0)
ALK PHOS: 58 U/L (ref 40–150)
ALT: 9 U/L (ref 0–55)
ANION GAP: 7 (ref 3–11)
AST: 16 U/L (ref 5–34)
BUN: 14 mg/dL (ref 7–26)
CO2: 27 mmol/L (ref 22–29)
CREATININE: 0.8 mg/dL (ref 0.60–1.10)
Calcium: 9 mg/dL (ref 8.4–10.4)
Chloride: 103 mmol/L (ref 98–109)
GFR, Est AFR Am: >60 mL/min (ref >60)
GFR, Estimated: >60 mL/min (ref >60)
GLUCOSE: 109 mg/dL (ref 70–140)
Potassium: 4.1 mmol/L (ref 3.5–5.1)
SODIUM: 137 mmol/L (ref 136–145)
Total Bilirubin: 0.3 mg/dL (ref 0.2–1.2)
Total Protein: 6.8 g/dL (ref 6.4–8.3)

## 2018-02-04 MED ORDER — FAMOTIDINE IN NACL 20-0.9 MG/50ML-% IV SOLN
INTRAVENOUS | Status: AC
  Filled 2018-02-04: qty 50

## 2018-02-04 MED ORDER — PALONOSETRON HCL INJECTION 0.25 MG/5ML
0.2500 mg | Freq: Once | INTRAVENOUS | Status: AC
  Administered 2018-02-04: 0.25 mg via INTRAVENOUS

## 2018-02-04 MED ORDER — SODIUM CHLORIDE 0.9 % IV SOLN
300.0000 mg | Freq: Once | INTRAVENOUS | Status: AC
  Administered 2018-02-04: 300 mg via INTRAVENOUS
  Filled 2018-02-04: qty 30

## 2018-02-04 MED ORDER — PALONOSETRON HCL INJECTION 0.25 MG/5ML
INTRAVENOUS | Status: AC
  Filled 2018-02-04: qty 5

## 2018-02-04 MED ORDER — SODIUM CHLORIDE 0.9 % IV SOLN
Freq: Once | INTRAVENOUS | Status: AC
  Administered 2018-02-04: 15:00:00 via INTRAVENOUS

## 2018-02-04 MED ORDER — FAMOTIDINE 20 MG PO TABS
ORAL_TABLET | ORAL | Status: AC
  Filled 2018-02-04: qty 1

## 2018-02-04 MED ORDER — FAMOTIDINE 20 MG PO TABS
20.0000 mg | ORAL_TABLET | Freq: Once | ORAL | Status: AC
  Administered 2018-02-04: 20 mg via ORAL

## 2018-02-04 MED ORDER — DIPHENHYDRAMINE HCL 25 MG PO CAPS
ORAL_CAPSULE | ORAL | Status: AC
  Filled 2018-02-04: qty 1

## 2018-02-04 MED ORDER — HYDROCODONE-ACETAMINOPHEN 10-325 MG PO TABS: 1.0000 | ORAL_TABLET | ORAL | 0 refills | Status: DC | PRN

## 2018-02-04 MED ORDER — SODIUM CHLORIDE 0.9% FLUSH
10.0000 mL | Freq: Once | INTRAVENOUS | Status: AC
  Administered 2018-02-04: 10 mL
  Filled 2018-02-04: qty 10

## 2018-02-04 MED ORDER — SODIUM CHLORIDE 0.9 % IV SOLN
Freq: Once | INTRAVENOUS | Status: AC
  Administered 2018-02-04: 16:00:00 via INTRAVENOUS
  Filled 2018-02-04: qty 5

## 2018-02-04 MED ORDER — HEPARIN SOD (PORK) LOCK FLUSH 100 UNIT/ML IV SOLN
500.0000 [IU] | Freq: Once | INTRAVENOUS | Status: AC | PRN
  Administered 2018-02-04: 500 [IU]
  Filled 2018-02-04: qty 5

## 2018-02-04 MED ORDER — DIPHENHYDRAMINE HCL 25 MG PO TABS
25.0000 mg | ORAL_TABLET | Freq: Once | ORAL | Status: AC
  Administered 2018-02-04: 25 mg via ORAL
  Filled 2018-02-04: qty 1

## 2018-02-04 MED ORDER — SODIUM CHLORIDE 0.9% FLUSH
10.0000 mL | INTRAVENOUS | Status: DC | PRN
Start: 2018-02-04 — End: 2018-02-04
  Administered 2018-02-04: 10 mL
  Filled 2018-02-04: qty 10

## 2018-02-04 MED ORDER — MORPHINE SULFATE ER 15 MG PO TBCR: 15.0000 mg | EXTENDED_RELEASE_TABLET | Freq: Two times a day (BID) | ORAL | 0 refills | Status: DC

## 2018-02-04 MED FILL — MORPHINE SULF ER 15 MG TAB: 15 | 30 days supply | Qty: 60 | Fill #0

## 2018-02-04 MED FILL — HYDROCODON-APAP 10-325: 10-325 | 15 days supply | Qty: 90 | Fill #0

## 2018-02-04 NOTE — Assessment & Plan Note (Signed)
She has no recent exacerbation of COPD She will continue inhaler She is off oxygen therapy

## 2018-02-04 NOTE — Progress Notes (Signed)
Okay to treat with low platelets per Dr. Alvy Bimler.

## 2018-02-04 NOTE — Assessment & Plan Note (Signed)
She will continue physical therapy and occupational therapy

## 2018-02-04 NOTE — Patient Instructions (Signed)
[facility] Discharge Instructions for Patients Receiving Chemotherapy  Today you received the following chemotherapy agents Carboplatin  To help prevent nausea and vomiting after your treatment, we encourage you to take your nausea medication as directed   If you develop nausea and vomiting that is not controlled by your nausea medication, call the clinic.   BELOW ARE SYMPTOMS THAT SHOULD BE REPORTED IMMEDIATELY:  *FEVER GREATER THAN 100.5 F  *CHILLS WITH OR WITHOUT FEVER  NAUSEA AND VOMITING THAT IS NOT CONTROLLED WITH YOUR NAUSEA MEDICATION  *UNUSUAL SHORTNESS OF BREATH  *UNUSUAL BRUISING OR BLEEDING  TENDERNESS IN MOUTH AND THROAT WITH OR WITHOUT PRESENCE OF ULCERS  *URINARY PROBLEMS  *BOWEL PROBLEMS  UNUSUAL RASH Items with * indicate a potential emergency and should be followed up as soon as possible.  Feel free to call the clinic should you have any questions or concerns. The clinic phone number is [phone].  Please show the [facility] at check-in to the Emergency Department and triage nurse.

## 2018-02-04 NOTE — Assessment & Plan Note (Signed)
The patient is feeling well She has no signs or symptoms of recurrent pleural effusion or ascites We discussed the risk and benefit of delaying treatment but the patient wants to proceed I plan to reduce the dose of treatment further. I plan to repeat imaging study next month to reassess If she continues to have positive response to treatment, I would recommend interval surgery I will coordinate appointment with GYN oncology service

## 2018-02-04 NOTE — Assessment & Plan Note (Signed)
We discussed the risk and benefit of continuation of treatment versus delaying treatment Ultimately, we agreed to proceed with treatment today with dose adjustment The patient is counseled to watch out for signs and symptoms of bleeding There is no contraindication for her to proceed with her anticoagulation therapy

## 2018-02-04 NOTE — Progress Notes (Signed)
[place] OFFICE PROGRESS NOTE  Patient Care Team: Asencion Noble, MD as PCP - General (Internal Medicine) Satira Sark, MD as PCP - Cardiology (Cardiology) Barbaraann Faster, RN as Marine, Fransisco Hertz, PA-C as Physician Assistant (Physician Assistant) Heath Lark, MD as Consulting Physician (Hematology and Oncology) Collene Gobble, MD as Consulting Physician (Pulmonary Disease)  ASSESSMENT & PLAN:  Ovarian CA, right [facility]) The patient is feeling well She has no signs or symptoms of recurrent pleural effusion or ascites We discussed the risk and benefit of delaying treatment but the patient wants to proceed I plan to reduce the dose of treatment further. I plan to repeat imaging study next month to reassess If she continues to have positive response to treatment, I would recommend interval surgery I will coordinate appointment with GYN oncology service  Pancytopenia, acquired [facility]) We discussed the risk and benefit of continuation of treatment versus delaying treatment Ultimately, we agreed to proceed with treatment today with dose adjustment The patient is counseled to watch out for signs and symptoms of bleeding There is no contraindication for her to proceed with her anticoagulation therapy  COPD (chronic obstructive pulmonary disease) ([facility]) She has no recent exacerbation of COPD She will continue inhaler She is off oxygen therapy  Generalized weakness She will continue physical therapy and occupational therapy   Orders Placed This Encounter  Procedures  . CT ABDOMEN PELVIS W CONTRAST    Standing Status:   Future    Standing Expiration Date:   02/05/2019    Order Specific Question:   If indicated for the ordered procedure, I authorize the administration of contrast media per Radiology protocol    Answer:   Yes    Order Specific Question:   Preferred imaging location?    Answer:   [facility]    Order  Specific Question:   Radiology Contrast Protocol - do NOT remove file path    Answer:   [email or web address]    INTERVAL HISTORY: Please see below for problem oriented charting. She returns for chemotherapy with her daughter She is doing well with outpatient physical therapy and rehabilitation Denies recent hospitalization No recent infection She denies recent cough, shortness of breath or the need for oxygen therapy The patient denies any recent signs or symptoms of bleeding such as spontaneous epistaxis, hematuria or hematochezia. No recent worsening peripheral neuropathy Denies abdominal bloating, nausea or changes in bowel habits SUMMARY OF ONCOLOGIC HISTORY:   Ovarian CA, right ([place])   02/18/2016 Tumor Marker    Patient's tumor was tested for the following markers: CA125 Results of the tumor marker test revealed 45      05/22/2016 Tumor Marker    Patient's tumor was tested for the following markers: CA125 Results of the tumor marker test revealed 53      05/22/2016 Imaging    Outside pelvic US showed 4.1 cm adnexa mass      06/24/2017 Imaging    Ct abdomen and pelvis:  1. Interim finding of moderate ascites within the abdomen and pelvis with additional finding of diffuse nodular infiltration of the omentum and anterior mesenteric fat, the appearance would be consistent with peritoneal carcinomatosis/metastatic disease. Increasing retroperitoneal and upper abdominal adenopathy. 2. Re- demonstrated 3.8 cm cyst in the right adnexa. Enlarging soft tissue density in the left adnexa now with possible cystic component posteriorly. In light of the above findings, concern is for ovarian neoplasm. Correlation with pelvic ultrasound recommended. 3. Small  right-sided pleural effusion, new since prior study 4. Stable hypodense splenic lesions since 2017.       06/25/2017 Imaging    US pelvis: 2.9 cm simple appearing cyst in the right ovary. Left ovary grossly  unremarkable. Large volume ascites in the pelvis      06/30/2017 - 07/01/2017 Hospital Admission    She was admitted for evaluation of abdominal pain and ascites      07/01/2017 Pathology Results    PERITONEAL/ASCITIC FLUID(SPECIMEN 1 OF 1 COLLECTED 07/01/17): - POORLY DIFFERENTIATED CARCINOMA; SEE COMMENT Source Peritoneal/Ascitic Fluid, (specimen 1 of 1 collected 07/01/17) Gross Specimen: Received is/are 1000 cc's of brownish fluid. (BS:bs) Prepared: # Smears: 0 # Concentration Technique Slides (i.e. ThinPrep): 1 # Cell Block: 1 Additional Studies: Also received Hematology slide - [id number]. Comment The tumor cells are positive for cytokeratin 7 and Pax-8 but negative for cytokeratin 20, CDX-2, GATA-3, Napsin-A and TTF-1. Based on the immunoprofile a gynecology primary is favored      07/01/2017 Procedure    Successful ultrasound-guided diagnostic and therapeutic paracentesis yielding 2.5 liters of peritoneal fluid      07/07/2017 - 07/09/2017 Hospital Admission    She was admitted for management of malignant ascites      07/08/2017 Procedure    Successful ultrasound-guided therapeutic paracentesis yielding 2.7 liters liters of peritoneal fluid      07/12/2017 Procedure    Successful ultrasound-guided paracentesis yielding 1450 mL of peritoneal fluid      07/18/2017 - 07/24/2017 Hospital Admission    She was admitted for expedited treatment      07/18/2017 Tumor Marker    Patient's tumor was tested for the following markers: CA125 Results of the tumor marker test revealed 1941      07/19/2017 - 11/04/2017 Chemotherapy    The patient had 6 cycles of carboplatin & Taxol for chemotherapy treatment.        08/06/2017 Procedure    Successful ultrasound-guided therapeutic paracentesis yielding 2.6 liters of peritoneal fluid.      08/09/2017 Tumor Marker    Patient's tumor was tested for the following markers: CA125 Results of the tumor marker test revealed 1665       08/15/2017 Tumor Marker    Patient's tumor was tested for the following markers: CA125 Results of the tumor marker test revealed 937.9      08/20/2017 Imaging    ECHO: Normal LV size with EF 60-65%. Normal RV size and systolic function. No significant valvular abnormalities.      09/18/2017 Imaging    Chest Impression:  1. No evidence thoracic metastasis. 2. Interval increase and RIGHT pleural effusion.  Abdomen / Pelvis Impression:  1. Interval decrease in intraperitoneal free fluid. 2. Interval decrease in omental nodularity in the LEFT ventral peritoneal space. 3. Interval decrease in nodularity associated with the LEFT ovary. 4. Cystic portion of the RIGHT ovary is increased mildly in size.      09/20/2017 Tumor Marker    Patient's tumor was tested for the following markers: CA125 Results of the tumor marker test revealed 347      10/14/2017 Tumor Marker    Patient's tumor was tested for the following markers: CA125 Results of the tumor marker test revealed 307.4      11/04/2017 Tumor Marker    Patient's tumor was tested for the following markers: CA125 Results of the tumor marker test revealed 262.5      11/28/2017 Imaging    1. Interval decrease in right pleural effusion with  resolution of right atelectasis seen previously. 2. New small left pleural effusion, symmetric to the right. 3. No intraperitoneal free fluid on the current study. 4. Continued further decrease in left omental disease, appearing less confluent today than on the prior study. 5. Left ovary remains normal in appearance today and the right adnexal cystic lesion is decreased in size compared to prior study. 6. 14 mm pancreatic cyst is unchanged. Continued attention on follow-up imaging recommended. 7. Aortic Atherosclerois (ICD10-170.0)      12/13/2017 Tumor Marker    Patient's tumor was tested for the following markers: CA125 Results of the tumor marker test revealed 197.7      01/03/2018 Tumor  Marker    Patient's tumor was tested for the following markers: CA125 Results of the tumor marker test revealed 183.1      01/14/2018 Tumor Marker    Patient's tumor was tested for the following markers: CA125 Results of the tumor marker test revealed 177.4       REVIEW OF SYSTEMS:   Constitutional: Denies fevers, chills or abnormal weight loss Eyes: Denies blurriness of vision Ears, nose, mouth, throat, and face: Denies mucositis or sore throat Respiratory: Denies cough, dyspnea or wheezes Cardiovascular: Denies palpitation, chest discomfort or lower extremity swelling Gastrointestinal:  Denies nausea, heartburn or change in bowel habits Skin: Denies abnormal skin rashes Lymphatics: Denies new lymphadenopathy or easy bruising Neurological:Denies numbness, tingling or new weaknesses Behavioral/Psych: Mood is stable, no new changes  All other systems were reviewed with the patient and are negative.  I have reviewed the past medical history, past surgical history, social history and family history with the patient and they are unchanged from previous note.  ALLERGIES:  is allergic to codeine.  MEDICATIONS:  Current Outpatient Medications  Medication Sig Dispense Refill  . albuterol (PROAIR HFA) 108 (90 Base) MCG/ACT inhaler Inhale 2 puffs into the lungs every 6 (six) hours as needed for wheezing or shortness of breath. 1 Inhaler 3  . ALPRAZolam (XANAX) 0.25 MG tablet Take 1 tablet (0.25 mg total) by mouth at bedtime as needed for anxiety. 5 tablet 0  . azelastine (ASTELIN) 0.1 % nasal spray Place 2 sprays into both nostrils 2 (two) times daily as needed for allergies.     . Cholecalciferol (VITAMIN D) 2000 units CAPS Take 2,000 Units by mouth daily.    Marland Kitchen diltiazem (CARTIA XT) 120 MG 24 hr capsule Take 1 capsule (120 mg total) by mouth 2 (two) times daily. TAKE 1 CAPSULE BY MOUTH EVERY DAY 180 capsule 3  . diphenhydrAMINE (BENADRYL) 25 mg capsule Take 25 mg by mouth every 6 (six)  hours as needed for itching.    . diphenoxylate-atropine (LOMOTIL) 2.5-0.025 MG tablet Take 1 tablet by mouth 4 (four) times daily. (Patient taking differently: Take 1 tablet by mouth 4 (four) times daily as needed for diarrhea or loose stools. ) 360 tablet 1  . DULoxetine (CYMBALTA) 30 MG capsule TAKE 1 CAPSULE(30 MG) BY MOUTH DAILY 30 capsule 0  . fluticasone (FLONASE) 50 MCG/ACT nasal spray Place 2 sprays into both nostrils 2 (two) times daily. For nasal congestion    . furosemide (LASIX) 20 MG tablet Take 1 tablet (20 mg total) by mouth daily. 90 tablet 9  . HYDROcodone-acetaminophen (NORCO) 10-325 MG tablet Take 1 tablet by mouth every 4 (four) hours as needed. 90 tablet 0  . ipratropium-albuterol (DUONEB) 0.5-2.5 (3) MG/3ML SOLN Take 3 mLs by nebulization 3 (three) times daily. (Patient taking differently: Take 3 mLs  by nebulization every 6 (six) hours as needed (cough or SOB). As needed) 360 mL   . levothyroxine (SYNTHROID, LEVOTHROID) 137 MCG tablet Take 137 mcg by mouth every morning. For thyroid therapy    . lidocaine-prilocaine (EMLA) cream Apply 1 application topically as needed. 30 g 6  . loperamide (IMODIUM) 2 MG capsule Take 2 capsules (4 mg total) by mouth 3 (three) times daily. 30 capsule 0  . mirtazapine (REMERON) 15 MG tablet Take 1 tablet (15 mg total) by mouth at bedtime. 30 tablet 1  . morphine (MS CONTIN) 15 MG 12 hr tablet Take 1 tablet (15 mg total) by mouth every 12 (twelve) hours. 60 tablet 0  . ondansetron (ZOFRAN) 8 MG tablet Take 1 tablet (8 mg total) by mouth every 8 (eight) hours as needed for nausea or vomiting. (Patient not taking: Reported on 12/27/2017) 60 tablet 0  . polyvinyl alcohol (LIQUIFILM TEARS) 1.4 % ophthalmic solution 1 drop 2 (two) times daily as needed for dry eyes.    . potassium chloride (K-DUR) 10 MEQ tablet Take 10 mEq by mouth daily.    . prochlorperazine (COMPAZINE) 10 MG tablet Take 1 tablet (10 mg total) by mouth every 6 (six) hours as needed  (Nausea or vomiting). 30 tablet 1  . Tiotropium Bromide-Olodaterol (STIOLTO RESPIMAT) 2.5-2.5 MCG/ACT AERS Inhale 2 puffs into the lungs daily. 1 Inhaler 3  . XARELTO 20 MG TABS tablet TAKE 1 TABLET BY MOUTH EVERY DAY WITH SUPPER 90 tablet 0   No current facility-administered medications for this visit.    Facility-Administered Medications Ordered in Other Visits  Medication Dose Route Frequency Provider Last Rate Last Dose  . CARBOplatin (PARAPLATIN) 300 mg in sodium chloride 0.9 % 250 mL chemo infusion  300 mg Intravenous Once Alvy Bimler, Dezmen Alcock, MD      . heparin lock flush 100 unit/mL  500 Units Intracatheter Once PRN Alvy Bimler, Ziyanna Tolin, MD      . sodium chloride flush (NS) 0.9 % injection 10 mL  10 mL Intracatheter PRN Alvy Bimler, Ibraham Levi, MD        PHYSICAL EXAMINATION: ECOG PERFORMANCE STATUS: 1 - Symptomatic but completely ambulatory  Vitals:   02/04/18 1429  BP: 139/73  Pulse: (!) 103  Resp: 18  Temp: 98.4 F (36.9 C)  SpO2: 97%   Filed Weights   02/04/18 1429  Weight: 165 lb 12.8 oz (75.2 kg)    GENERAL:alert, no distress and comfortable SKIN: skin color, texture, turgor are normal, no rashes or significant lesions EYES: normal, Conjunctiva are pink and non-injected, sclera clear OROPHARYNX:no exudate, no erythema and lips, buccal mucosa, and tongue normal  NECK: supple, thyroid normal size, non-tender, without nodularity LYMPH:  no palpable lymphadenopathy in the cervical, axillary or inguinal LUNGS: clear to auscultation and percussion with normal breathing effort HEART: regular rate & rhythm and no murmurs and no lower extremity edema ABDOMEN:abdomen soft, non-tender and normal bowel sounds Musculoskeletal:no cyanosis of digits and no clubbing  NEURO: alert & oriented x 3 with fluent speech, no focal motor/sensory deficits  LABORATORY DATA:  I have reviewed the data as listed    Component Value Date/Time   NA 137 02/04/2018 1238   NA 135 (L) 09/24/2017 0945   K 4.1 02/04/2018  1238   K 3.7 09/24/2017 0945   CL 103 02/04/2018 1238   CO2 27 02/04/2018 1238   CO2 30 (H) 09/24/2017 0945   GLUCOSE 109 02/04/2018 1238   GLUCOSE 101 09/24/2017 0945   BUN 14 02/04/2018 1238  BUN 15.3 09/24/2017 0945   CREATININE 0.80 02/04/2018 1238   CREATININE 0.8 09/24/2017 0945   CALCIUM 9.0 02/04/2018 1238   CALCIUM 9.1 09/24/2017 0945   PROT 6.8 02/04/2018 1238   PROT 6.6 09/24/2017 0945   ALBUMIN 3.4 (L) 02/04/2018 1238   ALBUMIN 3.3 (L) 09/24/2017 0945   AST 16 02/04/2018 1238   AST 16 09/24/2017 0945   ALT 9 02/04/2018 1238   ALT 12 09/24/2017 0945   ALKPHOS 58 02/04/2018 1238   ALKPHOS 52 09/24/2017 0945   BILITOT 0.3 02/04/2018 1238   BILITOT 1.13 09/24/2017 0945   GFRNONAA >60 02/04/2018 1238   GFRAA >60 02/04/2018 1238    No results found for: SPEP, UPEP  Lab Results  Component Value Date   WBC 4.6 02/04/2018   NEUTROABS 2.7 02/04/2018   HGB 11.1 (L) 02/04/2018   HCT 33.0 (L) 02/04/2018   MCV 98.9 02/04/2018   PLT 87 (L) 02/04/2018      Chemistry      Component Value Date/Time   NA 137 02/04/2018 1238   NA 135 (L) 09/24/2017 0945   K 4.1 02/04/2018 1238   K 3.7 09/24/2017 0945   CL 103 02/04/2018 1238   CO2 27 02/04/2018 1238   CO2 30 (H) 09/24/2017 0945   BUN 14 02/04/2018 1238   BUN 15.3 09/24/2017 0945   CREATININE 0.80 02/04/2018 1238   CREATININE 0.8 09/24/2017 0945      Component Value Date/Time   CALCIUM 9.0 02/04/2018 1238   CALCIUM 9.1 09/24/2017 0945   ALKPHOS 58 02/04/2018 1238   ALKPHOS 52 09/24/2017 0945   AST 16 02/04/2018 1238   AST 16 09/24/2017 0945   ALT 9 02/04/2018 1238   ALT 12 09/24/2017 0945   BILITOT 0.3 02/04/2018 1238   BILITOT 1.13 09/24/2017 0945       All questions were answered. The patient knows to call the clinic with any problems, questions or concerns. No barriers to learning was detected.  I spent 20 minutes counseling the patient face to face. The total time spent in the appointment was 30  minutes and more than 50% was on counseling and review of test results  Heath Lark, MD 02/04/2018 4:38 PM

## 2018-02-05 ENCOUNTER — Ambulatory Visit (HOSPITAL_COMMUNITY): Payer: Medicare Other | Attending: Hematology and Oncology

## 2018-02-05 ENCOUNTER — Encounter (HOSPITAL_COMMUNITY): Payer: Self-pay

## 2018-02-05 ENCOUNTER — Telehealth: Payer: Self-pay | Admitting: *Deleted

## 2018-02-05 DIAGNOSIS — M6281 Muscle weakness (generalized): Secondary | ICD-10-CM | POA: Diagnosis not present

## 2018-02-05 DIAGNOSIS — R2689 Other abnormalities of gait and mobility: Secondary | ICD-10-CM | POA: Insufficient documentation

## 2018-02-05 DIAGNOSIS — M79662 Pain in left lower leg: Secondary | ICD-10-CM | POA: Insufficient documentation

## 2018-02-05 LAB — CA 125: Cancer Antigen (CA) 125: 168.5 U/mL — ABNORMAL HIGH (ref 0.0–38.1)

## 2018-02-05 NOTE — Telephone Encounter (Signed)
Called and spoke with the patient's daughter. Gave the appt for May 23rd at 11:45am. Reminded her of the valet service and pelvic exam

## 2018-02-05 NOTE — Therapy (Signed)
[place], [place], [address] Phone: [phone]   Fax:  [phone]  Physical Therapy Treatment  Patient Details  Name: [name] MRN: [id number] Date of Birth: 07/11/1933 Referring Provider: Dr. Heath Lark   Encounter Date: 02/05/2018  PT End of Session - 02/05/18 1208    Visit Number  18    Number of Visits  30    Date for PT Re-Evaluation  02/28/18    Authorization Type  Medicare    Authorization Time Period  12/23/17 to 02/03/18; NEW: 01/31/18 to 02/28/18    PT Start Time  1125    PT Stop Time  1205    PT Time Calculation (min)  40 min    Equipment Utilized During Treatment  Gait belt    Activity Tolerance  Patient tolerated treatment well;Patient limited by fatigue    Behavior During Therapy  [facility] for tasks assessed/performed       Past Medical History:  Diagnosis Date  . COPD (chronic obstructive pulmonary disease) ([place])   . Depression   . Diverticulosis   . Family hx of colon cancer   . Fibromyalgia   . GERD (gastroesophageal reflux disease)   . Hemorrhoids   . Hiatal hernia   . History of shingles   . Hypothyroidism   . IBS (irritable bowel syndrome)   . Lymphocytic colitis   . Osteoporosis   . Ovarian cancer ([place])   . Paroxysmal atrial fibrillation (H[facility])   . Psoriatic arthritis ([place])   . Rectal polyp   . Schatzki's ring   . Uterine polyp     Past Surgical History:  Procedure Laterality Date  . CHOLECYSTECTOMY  Dec 04, 2010  . COLONOSCOPY    . FEMUR IM NAIL Left 12/11/2013   Procedure: INTRAMEDULLARY (IM) NAIL FEMORAL;  Surgeon: [name], MD;  Location: [facility];  Service: Orthopedics;  Laterality: Left;  . FRACTURE SURGERY Left 12/11/2013   hip  . IR FLUORO GUIDE PORT INSERTION RIGHT  07/22/2017  . IR PARACENTESIS  07/12/2017  . IR US GUIDE VASC ACCESS RIGHT  07/22/2017  . KNEE SURGERY Left    x 2  . TOTAL KNEE REVISION  08/06/2012   Procedure: TOTAL KNEE REVISION;   Surgeon: [name], MD;  Location: [facility];  Service: Orthopedics;  Laterality: Left;  Left Total Knee Arthroplasty Revision  . Uterine polypectomy      There were no vitals filed for this visit.  Subjective Assessment - 02/05/18 1125    Subjective  Went to MD yesterday with reports of pain reduces following new medication, able to sleep through the whole night last night.  Reports plans for apt 02/28/18; to discuss surgery.      Patient Stated Goals  Would like to come to clinic to build up her strength and endurance    Currently in Pain?  Yes    Pain Score  3     Pain Location  Hip    Pain Orientation  Left;Right;Lower    Pain Descriptors / Indicators  Dull;Aching    Pain Type  Chronic pain    Pain Frequency  Intermittent    Aggravating Factors   nothing, walking, standing    Pain Relieving Factors  activity    Effect of Pain on Daily Activities  decreased ability to complete ADLs  Balance Exercises - 02/05/18 1149      Balance Exercises: Standing   Tandem Stance  Eyes open;Foam/compliant surface;3 reps;30 secs    SLS  Eyes open;5 reps    Rockerboard  Anterior/posterior;Lateral;Other time (comment);Other (comment) balance in middle for 2 min each direction with minimal HHA    Balance Beam  tandem and retro 3RT inside // bars    Retro Gait  Foam/compliant surface;3 reps;Upper extremity support    Step Over Hurdles / Cones  3RT in // bars with 2# weight on ankles, lateral stepping over 3x 6" hurdles          PT Short Term Goals - 01/31/18 1034      PT SHORT TERM GOAL #1   Title  Patient will ambulated >250 feet with rolling walker during the 3 MWT to improve her ability to ambulate further within her home before needing to rest.    Baseline  4/26: 33f, no rest breaks    Time  3    Period  Weeks    Status  Achieved      PT SHORT TERM GOAL #2   Title  Patient with tolerate the standing position with upper extremity  support for 5 minutes before needing to rest to improve her ability to perform standing functional activities and ADLs within her home environment.    Baseline  3/18: able to stand 5 mins with no UE support    Time  3    Period  Weeks    Status  Achieved      PT SHORT TERM GOAL #3   Title  Patient will increase MMT grades by 1/2 grade to show improvement in her strength and stability to perform functional activities.    Baseline  4/26: has improved by at least 1/2 grade throughout since initial evaluation    Time  3    Period  Weeks    Status  Achieved      PT SHORT TERM GOAL #4   Title  Patient will be able to stand on either foot for 10 seconds to exhibit improved balance and decreased risk for falls.    Baseline  4/26: 8 sec on RLE and 6 sec on LLE with no UE    Time  3    Period  Weeks    Status  On-going        PT Long Term Goals - 01/31/18 1035      PT LONG TERM GOAL #1   Title  Patient will ambulated >500 feet with rolling walker during the 3 MWT to improve her ability to ambulate further  in community to allow to her go enjoy going out for a meal.    Baseline  4/26: [age]f no rest breaks    Time  6    Period  Weeks    Status  On-going      PT LONG TERM GOAL #2   Title  Patient with tolerate the standing position with upper extremity support for 10 minutes before needing to rest to improve her ability to stand and have a conversation with a friend or relative.    Baseline  4/10: able to stand statically for 10 mins this date with no UE support    Time  6    Period  Weeks    Status  Achieved      PT LONG TERM GOAL #3   Title  Patient will increase MMT grades by 1 grade to show continued  improvement in her strength and stability to perform functional activities at home independently.    Baseline  4/26: see MMT    Time  6    Period  Weeks    Status  Partially Met      PT LONG TERM GOAL #4   Title  Patient will be able to stand on either foot for 15 seconds to  exhibit improved balance and further decreased risk for falls.    Baseline  4/26: 8 sec on RLE and 6 sec on LLE with no UE    Time  6    Period  Weeks    Status  On-going      PT LONG TERM GOAL #5   Title  Pt will be able to perform 5xSTS in 12 sec or < with no UE in order to demo improved functional BLE strength and overall balance.    Baseline  4/26: 11.8 sec, no UE    Time  6    Period  Weeks    Status  Achieved      Additional Long Term Goals   Additional Long Term Goals  Yes      PT LONG TERM GOAL #6   Title  Pt will report requiring assistance for ADLs and IADLs at home 25% of the time or < to demonstrate improved overall functional strength, endurance, and balance in order to promote independence at home.    Baseline  4/10: feels that her nurse aide and dtr provide about 25% assistance for ADLs and IADLs    Time  6    Period  Weeks    Status  Achieved      PT LONG TERM GOAL #7   Title  Pt will be able to perform 13 STS during the 30 sec chair rise test in order to demo improved functional BLE strength and further decrease risk for falls.    Time  4    Period  Weeks    Status  New    Target Date  02/28/18      PT LONG TERM GOAL #8   Title  Pt will report being able to stand and ambulate in her kitchen for 20 mins without a rest break to allow her to make a meal with greater ease and maximize function and independence at home.     Time  4    Period  Weeks    Status  New    Target Date  02/28/18            Plan - 02/05/18 1210    Clinical Impression Statement  Pt reports of vast improvements with LE pain following MD visit and new medication, reoprts of improved sleep last night as well.  Session focus on balance training.  Monitored vitals and seated rest breaks given through session per fatigue.  HR range from 99-116 bmp and O2 sat range between 94-98%.  Min A for safety on dynamic surfaces today, cueing to improve spatial awareness to assist with balance.  Pt  expressed she continues to have fear of falling and wishes to reduce AD.  Pt given advanced HEP to work towards balance at home on static surfaces with HHA as needed, pt stated she felt confident to complete at home.      Rehab Potential  Good    PT Frequency  3x / week    PT Duration  4 weeks    PT Treatment/Interventions  ADLs/Self Care Home Management;Gait training;Stair  training;Functional mobility training;Therapeutic activities;Patient/family education;Balance training;Neuromuscular re-education;Therapeutic exercise;Manual techniques;Energy conservation    PT Next Visit Plan  continue gait with SPC for endurance training; progress balance next session; stregnthening and endurance activities for hip/glutes strength; Nustep PRN for musculature endurance; Allow adequate rest breaks for patient, monitor HR; continue manual for gluteal restrictions PRN    PT Home Exercise Plan  intial - lap walking at home; supine bridges; 3/18: supine clams with RTB; 3/25: SAQ; 4/5: standing abd, marching, LAQ, knee flexion; 4/15: supine figure 4, supine SKTC; 4/17: sidelying hip abd; 5/1: tandem stance and SLS       Patient will benefit from skilled therapeutic intervention in order to improve the following deficits and impairments:  Abnormal gait, Pain, Decreased mobility, Decreased coordination, Decreased activity tolerance, Decreased endurance, Decreased strength, Decreased balance, Difficulty walking  Visit Diagnosis: Muscle weakness (generalized)  Pain in left lower leg  Other abnormalities of gait and mobility     Problem List Patient Active Problem List   Diagnosis Date Noted  . Thrombocytopenia ([place]) 01/03/2018  . Anemia, chronic disease 12/13/2017  . Mild protein-calorie malnutrition ([place]) 12/13/2017  . Peripheral neuropathy due to chemotherapy ([place]) 12/13/2017  . S/P thoracentesis   . Atrial fibrillation with RVR ([place]) 11/08/2017  . HCAP (healthcare-associated pneumonia) 11/08/2017  .  Acute on chronic diastolic heart failure ([place]) 08/19/2017  . Pancytopenia, acquired ([place]) 08/15/2017  . Protein-calorie malnutrition, moderate ([place]) 07/30/2017  . Generalized weakness 07/30/2017  . Antineoplastic chemotherapy induced pancytopenia ([place]) 07/20/2017  . Ovarian CA, right ([place]) 07/18/2017  . PNA (pneumonia) 07/07/2017  . Ascites 06/30/2017  . Ascites, malignant 06/30/2017  . Right ovarian cyst 01/18/2017  . Elevated CA-125 01/18/2017  . Depression 12/23/2015  . Peripheral edema 08/23/2015  . Fracture of hip, left, closed ([place]) 12/08/2013  . Hip fracture ([place]) 12/08/2013  . PAD (peripheral artery disease) ([place]) 11/25/2013  . Encounter for therapeutic drug monitoring 11/09/2013  . Atrial fibrillation ([place]) 05/07/2013  . Cough 03/20/2013  . Total knee replacement status 10/07/2012  . Knee pain 10/07/2012  . Knee stiffness 10/07/2012  . Tachycardia 09/17/2012  . Chest pain 09/17/2012  . Difficulty in walking(719.7) 09/16/2012  . Muscle weakness (generalized) 09/16/2012  . Postop Acute blood loss anemia 08/08/2012  . Instability of prosthetic knee ([place]) 08/06/2012  . Bloating 02/14/2012  . Upper abdominal pain 02/14/2012  . Allergic rhinitis, seasonal 02/15/2011  . COLITIS 02/20/2010  . Diarrhea 01/02/2010  . ABDOMINAL PAIN -GENERALIZED 01/02/2010  . PERSONAL HX COLONIC POLYPS 01/02/2010  . HYPOTHYROIDISM 12/28/2009  . COPD (chronic obstructive pulmonary disease) ([place]) 12/28/2009  . ARTHRITIS 12/28/2009   Ihor Austin, [place]; [place]  Aldona Lento 02/05/2018, 12:17 PM  [place], [place], [address] Phone: [phone]   Fax:  [phone]  Name: [name] MRN: [id number] Date of Birth: [birth year]

## 2018-02-05 NOTE — Patient Instructions (Signed)
Tandem Stance    Right foot in front of left, heel touching toe both feet "straight ahead". Stand on Foot Triangle of Support with both feet. Balance in this position 15-30 seconds. Do with left foot in front of right.  Copyright  VHI. All rights reserved.   Single Leg Balance: Eyes Open    Stand on right leg with eyes open. Hold 20 seconds. 5 reps 2_ times per day.  http://ggbe.exer.us/5   Copyright  VHI. All rights reserved.

## 2018-02-07 ENCOUNTER — Encounter

## 2018-02-10 ENCOUNTER — Ambulatory Visit (HOSPITAL_COMMUNITY): Payer: Medicare Other | Admitting: Physical Therapy

## 2018-02-10 ENCOUNTER — Telehealth (HOSPITAL_COMMUNITY): Payer: Self-pay | Admitting: Physical Therapy

## 2018-02-10 NOTE — Telephone Encounter (Signed)
Patient feels bad sometimes after chemo therapy and will not be coming today

## 2018-02-12 ENCOUNTER — Encounter (HOSPITAL_COMMUNITY): Payer: Self-pay

## 2018-02-12 ENCOUNTER — Ambulatory Visit (HOSPITAL_COMMUNITY): Payer: Medicare Other

## 2018-02-12 DIAGNOSIS — M6281 Muscle weakness (generalized): Secondary | ICD-10-CM | POA: Diagnosis not present

## 2018-02-12 DIAGNOSIS — M79662 Pain in left lower leg: Secondary | ICD-10-CM

## 2018-02-12 DIAGNOSIS — R2689 Other abnormalities of gait and mobility: Secondary | ICD-10-CM

## 2018-02-12 NOTE — Therapy (Signed)
[place], [place], [address] Phone: [phone]   Fax:  [phone]  Physical Therapy Treatment  Patient Details  Name: [name] MRN: [id number] Date of Birth: 28-Feb-1933 Referring Provider: Dr. Heath Lark   Encounter Date: 02/12/2018  PT End of Session - 02/12/18 1117    Visit Number  19    Number of Visits  30    Date for PT Re-Evaluation  02/28/18    Authorization Type  Medicare    Authorization Time Period  12/23/17 to 02/03/18; NEW: 01/31/18 to 02/28/18    PT Start Time  1116    PT Stop Time  1204    PT Time Calculation (min)  48 min    Equipment Utilized During Treatment  Gait belt    Activity Tolerance  Patient tolerated treatment well;Patient limited by fatigue    Behavior During Therapy  [facility] for tasks assessed/performed       Past Medical History:  Diagnosis Date  . COPD (chronic obstructive pulmonary disease) ([place])   . Depression   . Diverticulosis   . Family hx of colon cancer   . Fibromyalgia   . GERD (gastroesophageal reflux disease)   . Hemorrhoids   . Hiatal hernia   . History of shingles   . Hypothyroidism   . IBS (irritable bowel syndrome)   . Lymphocytic colitis   . Osteoporosis   . Ovarian cancer ([place])   . Paroxysmal atrial fibrillation ([facility])   . Psoriatic arthritis ([place])   . Rectal polyp   . Schatzki's ring   . Uterine polyp     Past Surgical History:  Procedure Laterality Date  . CHOLECYSTECTOMY  Dec 04, 2010  . COLONOSCOPY    . FEMUR IM NAIL Left 12/11/2013   Procedure: INTRAMEDULLARY (IM) NAIL FEMORAL;  Surgeon: [name], MD;  Location: [facility];  Service: Orthopedics;  Laterality: Left;  . FRACTURE SURGERY Left 12/11/2013   hip  . IR FLUORO GUIDE PORT INSERTION RIGHT  07/22/2017  . IR PARACENTESIS  07/12/2017  . IR US GUIDE VASC ACCESS RIGHT  07/22/2017  . KNEE SURGERY Left    x 2  . TOTAL KNEE REVISION  08/06/2012   Procedure: TOTAL KNEE REVISION;   Surgeon: [name], MD;  Location[facility];  Service: Orthopedics;  Laterality: Left;  Left Total Knee Arthroplasty Revision  . Uterine polypectomy      There were no vitals filed for this visit.  Subjective Assessment - 02/12/18 1117    Subjective  Pt states she has been in pain all over since her last chemo treatment. She has not done her HEP due to that.    Patient Stated Goals  Would like to come to clinic to build up her strength and endurance    Currently in Pain?  Yes    Pain Score  4     Pain Location  Hip    Pain Orientation  Right;Left    Pain Descriptors / Indicators  Aching;Dull    Pain Type  Chronic pain    Pain Onset  More than a month ago    Pain Frequency  Intermittent    Aggravating Factors   nothing, walking, standing    Pain Relieving Factors  activity    Effect of Pain on Daily Activities  decreased ability to complete ADLs            [facility] Adult PT Treatment/Exercise - 02/12/18 0001  Knee/Hip Exercises: Standing   Gait Training  gait with SPC x113ft straight x2RT      Balance Exercises - 02/12/18 1133      Balance Exercises: Standing   Tandem Stance  Eyes open;Foam/compliant surface +OH lifts with 2# weight bar x10 each    SLS  Eyes open;Intermittent upper extremity support;5 reps;10 secs    SLS with Vectors  Solid surface;Intermittent upper extremity assist;5 reps    Numbers 1-15  Foam/compliant surface x4 mins total, bil staggered stance    Marching Limitations  marching on foam 10x3" holds    Other Standing Exercises  cone taps on foam x5RT BLE            PT Education - 02/12/18 1205    Education provided  Yes    Education Details  exercise technique, proper breathing technique    Person(s) Educated  Patient    Methods  Explanation;Demonstration    Comprehension  Verbalized understanding;Returned demonstration       PT Short Term Goals - 01/31/18 1034      PT SHORT TERM GOAL #1   Title  Patient will ambulated >250 feet with  rolling walker during the 3 MWT to improve her ability to ambulate further within her home before needing to rest.    Baseline  4/26: 340ft, no rest breaks    Time  3    Period  Weeks    Status  Achieved      PT SHORT TERM GOAL #2   Title  Patient with tolerate the standing position with upper extremity support for 5 minutes before needing to rest to improve her ability to perform standing functional activities and ADLs within her home environment.    Baseline  3/18: able to stand 5 mins with no UE support    Time  3    Period  Weeks    Status  Achieved      PT SHORT TERM GOAL #3   Title  Patient will increase MMT grades by 1/2 grade to show improvement in her strength and stability to perform functional activities.    Baseline  4/26: has improved by at least 1/2 grade throughout since initial evaluation    Time  3    Period  Weeks    Status  Achieved      PT SHORT TERM GOAL #4   Title  Patient will be able to stand on either foot for 10 seconds to exhibit improved balance and decreased risk for falls.    Baseline  4/26: 8 sec on RLE and 6 sec on LLE with no UE    Time  3    Period  Weeks    Status  On-going        PT Long Term Goals - 01/31/18 1035      PT LONG TERM GOAL #1   Title  Patient will ambulated >500 feet with rolling walker during the 3 MWT to improve her ability to ambulate further  in community to allow to her go enjoy going out for a meal.    Baseline  4/26: 340ft, no rest breaks    Time  6    Period  Weeks    Status  On-going      PT LONG TERM GOAL #2   Title  Patient with tolerate the standing position with upper extremity support for 10 minutes before needing to rest to improve her ability to stand and have a conversation with a friend   or relative.    Baseline  4/10: able to stand statically for 10 mins this date with no UE support    Time  6    Period  Weeks    Status  Achieved      PT LONG TERM GOAL #3   Title  Patient will increase MMT grades by 1  grade to show continued improvement in her strength and stability to perform functional activities at home independently.    Baseline  4/26: see MMT    Time  6    Period  Weeks    Status  Partially Met      PT LONG TERM GOAL #4   Title  Patient will be able to stand on either foot for 15 seconds to exhibit improved balance and further decreased risk for falls.    Baseline  4/26: 8 sec on RLE and 6 sec on LLE with no UE    Time  6    Period  Weeks    Status  On-going      PT LONG TERM GOAL #5   Title  Pt will be able to perform 5xSTS in 12 sec or < with no UE in order to demo improved functional BLE strength and overall balance.    Baseline  4/26: 11.8 sec, no UE    Time  6    Period  Weeks    Status  Achieved      Additional Long Term Goals   Additional Long Term Goals  Yes      PT LONG TERM GOAL #6   Title  Pt will report requiring assistance for ADLs and IADLs at home 25% of the time or < to demonstrate improved overall functional strength, endurance, and balance in order to promote independence at home.    Baseline  4/10: feels that her nurse aide and dtr provide about 25% assistance for ADLs and IADLs    Time  6    Period  Weeks    Status  Achieved      PT LONG TERM GOAL #7   Title  Pt will be able to perform 13 STS during the 30 sec chair rise test in order to demo improved functional BLE strength and further decrease risk for falls.    Time  4    Period  Weeks    Status  New    Target Date  02/28/18      PT LONG TERM GOAL #8   Title  Pt will report being able to stand and ambulate in her kitchen for 20 mins without a rest break to allow her to make a meal with greater ease and maximize function and independence at home.     Time  4    Period  Weeks    Status  New    Target Date  02/28/18            Plan - 02/12/18 1206    Clinical Impression Statement  Continued with established POC working on gait and overall balance. Pt still feeling somewhat under the  weather which slightly limited session. She was challenged with dynamic UE movements on foam in NBOS position. SLS steadily improving so progressed to SLS with vectors; pt challenged with this. HR and O2 remained 122 bpm or < and 92% or > throughout session; only time her O2 dropped to 92% was at EOS when performing balance activity at numbers board in back hallway, but, she quickly recovered to   96% following. Continue as planned, progressing as able.    Rehab Potential  Good    PT Frequency  3x / week    PT Duration  4 weeks    PT Treatment/Interventions  ADLs/Self Care Home Management;Gait training;Stair training;Functional mobility training;Therapeutic activities;Patient/family education;Balance training;Neuromuscular re-education;Therapeutic exercise;Manual techniques;Energy conservation    PT Next Visit Plan  continue gait with SPC for endurance training; continue to progress balance; stregnthening and endurance activities for hip/glutes strength; Nustep PRN for musculature endurance; Allow adequate rest breaks for patient, monitor HR; continue manual for gluteal restrictions PRN    PT Home Exercise Plan  intial - lap walking at home; supine bridges; 3/18: supine clams with RTB; 3/25: SAQ; 4/5: standing abd, marching, LAQ, knee flexion; 4/15: supine figure 4, supine SKTC; 4/17: sidelying hip abd; 5/1: tandem stance and SLS    Consulted and Agree with Plan of Care  Patient       Patient will benefit from skilled therapeutic intervention in order to improve the following deficits and impairments:  Abnormal gait, Pain, Decreased mobility, Decreased coordination, Decreased activity tolerance, Decreased endurance, Decreased strength, Decreased balance, Difficulty walking  Visit Diagnosis: Muscle weakness (generalized)  Pain in left lower leg  Other abnormalities of gait and mobility     Problem List Patient Active Problem List   Diagnosis Date Noted  . Thrombocytopenia ([facility]) 01/03/2018  .  Anemia, chronic disease 12/13/2017  . Mild protein-calorie malnutrition ([facility]) 12/13/2017  . Peripheral neuropathy due to chemotherapy ([facility]) 12/13/2017  . S/P thoracentesis   . Atrial fibrillation with RVR ([facility]) 11/08/2017  . HCAP (healthcare-associated pneumonia) 11/08/2017  . Acute on chronic diastolic heart failure ([facility]) 08/19/2017  . Pancytopenia, acquired ([facility]) 08/15/2017  . Protein-calorie malnutrition, moderate ([facility]) 07/30/2017  . Generalized weakness 07/30/2017  . Antineoplastic chemotherapy induced pancytopenia ([facility]) 07/20/2017  . Ovarian CA, right ([facility]) 07/18/2017  . PNA (pneumonia) 07/07/2017  . Ascites 06/30/2017  . Ascites, malignant 06/30/2017  . Right ovarian cyst 01/18/2017  . Elevated CA-125 01/18/2017  . Depression 12/23/2015  . Peripheral edema 08/23/2015  . Fracture of hip, left, closed ([facility]) 12/08/2013  . Hip fracture ([facility]) 12/08/2013  . PAD (peripheral artery disease) ([facility]) 11/25/2013  . Encounter for therapeutic drug monitoring 11/09/2013  . Atrial fibrillation ([facility]) 05/07/2013  . Cough 03/20/2013  . Total knee replacement status 10/07/2012  . Knee pain 10/07/2012  . Knee stiffness 10/07/2012  . Tachycardia 09/17/2012  . Chest pain 09/17/2012  . Difficulty in walking(719.7) 09/16/2012  . Muscle weakness (generalized) 09/16/2012  . Postop Acute blood loss anemia 08/08/2012  . Instability of prosthetic knee ([facility]) 08/06/2012  . Bloating 02/14/2012  . Upper abdominal pain 02/14/2012  . Allergic rhinitis, seasonal 02/15/2011  . COLITIS 02/20/2010  . Diarrhea 01/02/2010  . ABDOMINAL PAIN -GENERALIZED 01/02/2010  . PERSONAL HX COLONIC POLYPS 01/02/2010  . HYPOTHYROIDISM 12/28/2009  . COPD (chronic obstructive pulmonary disease) ([facility]) 12/28/2009  . ARTHRITIS 12/28/2009       Brooke Powell PT, DPT  [facility] [address] [place], [place], [address] Phone: [phone]   Fax:  [phone]  Name: Jenniferann S  Reasor MRN: [id number] Date of Birth: 06/25/1933   

## 2018-02-14 ENCOUNTER — Ambulatory Visit (HOSPITAL_COMMUNITY): Payer: Medicare Other

## 2018-02-14 DIAGNOSIS — M6281 Muscle weakness (generalized): Secondary | ICD-10-CM | POA: Diagnosis not present

## 2018-02-14 DIAGNOSIS — R2689 Other abnormalities of gait and mobility: Secondary | ICD-10-CM

## 2018-02-14 DIAGNOSIS — M79662 Pain in left lower leg: Secondary | ICD-10-CM

## 2018-02-14 NOTE — Therapy (Signed)
[facility] [place], [place], [address] Phone: [phone]   Fax:  [phone]  Physical Therapy Treatment  Patient Details  Name: [name] MRN: [id number] Date of Birth: 05/01/1933 Referring Provider: Dr. Heath Lark   Encounter Date: 02/14/2018  PT End of Session - 02/14/18 1119    Visit Number  20    Number of Visits  30    Date for PT Re-Evaluation  02/28/18    Authorization Type  Medicare    Authorization Time Period  12/23/17 to 02/03/18; NEW: 01/31/18 to 02/28/18    PT Start Time  1120    PT Stop Time  1200    PT Time Calculation (min)  40 min    Equipment Utilized During Treatment  Gait belt    Activity Tolerance  Patient tolerated treatment well;Patient limited by fatigue    Behavior During Therapy  [facility] for tasks assessed/performed       Past Medical History:  Diagnosis Date  . COPD (chronic obstructive pulmonary disease) ([place])   . Depression   . Diverticulosis   . Family hx of colon cancer   . Fibromyalgia   . GERD (gastroesophageal reflux disease)   . Hemorrhoids   . Hiatal hernia   . History of shingles   . Hypothyroidism   . IBS (irritable bowel syndrome)   . Lymphocytic colitis   . Osteoporosis   . Ovarian cancer ([place])   . Paroxysmal atrial fibrillation (H[facility])   . Psoriatic arthritis ([place])   . Rectal polyp   . Schatzki's ring   . Uterine polyp     Past Surgical History:  Procedure Laterality Date  . CHOLECYSTECTOMY  Dec 04, 2010  . COLONOSCOPY    . FEMUR IM NAIL Left 12/11/2013   Procedure: INTRAMEDULLARY (IM) NAIL FEMORAL;  Surgeon: [name], MD;  Location: [facility];  Service: Orthopedics;  Laterality: Left;  . FRACTURE SURGERY Left 12/11/2013   hip  . IR FLUORO GUIDE PORT INSERTION RIGHT  07/22/2017  . IR PARACENTESIS  07/12/2017  . IR US GUIDE VASC ACCESS RIGHT  07/22/2017  . KNEE SURGERY Left    x 2  . TOTAL KNEE REVISION  08/06/2012   Procedure: TOTAL KNEE REVISION;   Surgeon: [name], MD;  Location: [facility];  Service: Orthopedics;  Laterality: Left;  Left Total Knee Arthroplasty Revision  . Uterine polypectomy      There were no vitals filed for this visit.  Subjective Assessment - 02/14/18 1120    Subjective  Pt reports feeling somewhat better and states that next week should be even better since she will have not had chemo for a week. Generalized soreness all over reported.    Patient Stated Goals  Would like to come to clinic to build up her strength and endurance    Currently in Pain?  Yes    Pain Score  4     Pain Location  Generalized    Pain Descriptors / Indicators  Aching;Dull    Pain Type  Chronic pain    Pain Onset  More than a month ago    Pain Frequency  Intermittent    Aggravating Factors   nothing, walking, standing    Pain Relieving Factors  activity    Effect of Pain on Daily Activities  decreased ability to complete ADLs           [facility] Adult PT Treatment/Exercise - 02/14/18 0001  Knee/Hip Exercises: Standing   Gait Training  gait wtih [facility] x173f straight      Balance Exercises - 02/14/18 1132      Balance Exercises: Standing   Standing Eyes Closed  Narrow base of support (BOS);Foam/compliant surface;5 reps;10 secs intermittent UE support    Tandem Stance  Eyes open;Foam/compliant surface front foot on dyna disc +OH lifts with 2# weight bar x15 bil    Marching Limitations  marching on foam 10x3" holds    Heel Raises Limitations  x20 foam    Toe Raise Limitations  x20 foam    Sit to Stand Time  2x10 mini squats on foam            PT Short Term Goals - 01/31/18 1034      PT SHORT TERM GOAL #1   Title  Patient will ambulated >250 feet with rolling walker during the 3 MWT to improve her ability to ambulate further within her home before needing to rest.    Baseline  4/26: 3475f no rest breaks    Time  3    Period  Weeks    Status  Achieved      PT SHORT TERM GOAL #2   Title  Patient with tolerate  the standing position with upper extremity support for 5 minutes before needing to rest to improve her ability to perform standing functional activities and ADLs within her home environment.    Baseline  3/18: able to stand 5 mins with no UE support    Time  3    Period  Weeks    Status  Achieved      PT SHORT TERM GOAL #3   Title  Patient will increase MMT grades by 1/2 grade to show improvement in her strength and stability to perform functional activities.    Baseline  4/26: has improved by at least 1/2 grade throughout since initial evaluation    Time  3    Period  Weeks    Status  Achieved      PT SHORT TERM GOAL #4   Title  Patient will be able to stand on either foot for 10 seconds to exhibit improved balance and decreased risk for falls.    Baseline  4/26: 8 sec on RLE and 6 sec on LLE with no UE    Time  3    Period  Weeks    Status  On-going        PT Long Term Goals - 01/31/18 1035      PT LONG TERM GOAL #1   Title  Patient will ambulated >500 feet with rolling walker during the 3 MWT to improve her ability to ambulate further  in community to allow to her go enjoy going out for a meal.    Baseline  4/26: 34068fno rest breaks    Time  6    Period  Weeks    Status  On-going      PT LONG TERM GOAL #2   Title  Patient with tolerate the standing position with upper extremity support for 10 minutes before needing to rest to improve her ability to stand and have a conversation with a friend or relative.    Baseline  4/10: able to stand statically for 10 mins this date with no UE support    Time  6    Period  Weeks    Status  Achieved      PT LONG TERM GOAL #3  Title  Patient will increase MMT grades by 1 grade to show continued improvement in her strength and stability to perform functional activities at home independently.    Baseline  4/26: see MMT    Time  6    Period  Weeks    Status  Partially Met      PT LONG TERM GOAL #4   Title  Patient will be able to  stand on either foot for 15 seconds to exhibit improved balance and further decreased risk for falls.    Baseline  4/26: 8 sec on RLE and 6 sec on LLE with no UE    Time  6    Period  Weeks    Status  On-going      PT LONG TERM GOAL #5   Title  Pt will be able to perform 5xSTS in 12 sec or < with no UE in order to demo improved functional BLE strength and overall balance.    Baseline  4/26: 11.8 sec, no UE    Time  6    Period  Weeks    Status  Achieved      Additional Long Term Goals   Additional Long Term Goals  Yes      PT LONG TERM GOAL #6   Title  Pt will report requiring assistance for ADLs and IADLs at home 25% of the time or < to demonstrate improved overall functional strength, endurance, and balance in order to promote independence at home.    Baseline  4/10: feels that her nurse aide and dtr provide about 25% assistance for ADLs and IADLs    Time  6    Period  Weeks    Status  Achieved      PT LONG TERM GOAL #7   Title  Pt will be able to perform 13 STS during the 30 sec chair rise test in order to demo improved functional BLE strength and further decrease risk for falls.    Time  4    Period  Weeks    Status  New    Target Date  02/28/18      PT LONG TERM GOAL #8   Title  Pt will report being able to stand and ambulate in her kitchen for 20 mins without a rest break to allow her to make a meal with greater ease and maximize function and independence at home.     Time  4    Period  Weeks    Status  New    Target Date  02/28/18            Plan - 02/14/18 1200    Clinical Impression Statement  Pt reporting some mild lightheadedness and dizziness upon amb with SPC; BP WNL at 135/68. She was able to amb 124f straight before requiring a break. Rest of session focused on functional strengthening and overall balance. Her O2 sate dropped more so than usual this date throughout session; it got down to 90% after the amb with the cane and dropped to 93% a few times  during exercises. However, she quickly recovered each time with a seated rest break. Her HR stayed below 125bpm the entire session and again, recovered with seated rest break. Pt is overall progressing well towards goals. Continue as planned, progressing as able.     Rehab Potential  Good    PT Frequency  3x / week    PT Duration  4 weeks    PT  Treatment/Interventions  ADLs/Self Care Home Management;Gait training;Stair training;Functional mobility training;Therapeutic activities;Patient/family education;Balance training;Neuromuscular re-education;Therapeutic exercise;Manual techniques;Energy conservation    PT Next Visit Plan  continue gait with SPC for endurance training; continue to progress balance; stregnthening and endurance activities for hip/glutes strength; Allow adequate rest breaks for patient, monitor HR; continue manual for gluteal restrictions PRN    PT Home Exercise Plan  intial - lap walking at home; supine bridges; 3/18: supine clams with RTB; 3/25: SAQ; 4/5: standing abd, marching, LAQ, knee flexion; 4/15: supine figure 4, supine SKTC; 4/17: sidelying hip abd; 5/1: tandem stance and SLS    Consulted and Agree with Plan of Care  Patient       Patient will benefit from skilled therapeutic intervention in order to improve the following deficits and impairments:  Abnormal gait, Pain, Decreased mobility, Decreased coordination, Decreased activity tolerance, Decreased endurance, Decreased strength, Decreased balance, Difficulty walking  Visit Diagnosis: Muscle weakness (generalized)  Pain in left lower leg  Other abnormalities of gait and mobility     Problem List Patient Active Problem List   Diagnosis Date Noted  . Thrombocytopenia ([place]) 01/03/2018  . Anemia, chronic disease 12/13/2017  . Mild protein-calorie malnutrition ([place]) 12/13/2017  . Peripheral neuropathy due to chemotherapy ([place]) 12/13/2017  . S/P thoracentesis   . Atrial fibrillation with RVR ([place]) 11/08/2017  .  HCAP (healthcare-associated pneumonia) 11/08/2017  . Acute on chronic diastolic heart failure ([place]) 08/19/2017  . Pancytopenia, acquired ([place]) 08/15/2017  . Protein-calorie malnutrition, moderate ([place]) 07/30/2017  . Generalized weakness 07/30/2017  . Antineoplastic chemotherapy induced pancytopenia ([place]) 07/20/2017  . Ovarian CA, right ([place]) 07/18/2017  . PNA (pneumonia) 07/07/2017  . Ascites 06/30/2017  . Ascites, malignant 06/30/2017  . Right ovarian cyst 01/18/2017  . Elevated CA-125 01/18/2017  . Depression 12/23/2015  . Peripheral edema 08/23/2015  . Fracture of hip, left, closed ([place]) 12/08/2013  . Hip fracture ([place]) 12/08/2013  . PAD (peripheral artery disease) ([place]) 11/25/2013  . Encounter for therapeutic drug monitoring 11/09/2013  . Atrial fibrillation ([place]) 05/07/2013  . Cough 03/20/2013  . Total knee replacement status 10/07/2012  . Knee pain 10/07/2012  . Knee stiffness 10/07/2012  . Tachycardia 09/17/2012  . Chest pain 09/17/2012  . Difficulty in walking(719.7) 09/16/2012  . Muscle weakness (generalized) 09/16/2012  . Postop Acute blood loss anemia 08/08/2012  . Instability of prosthetic knee ([place]) 08/06/2012  . Bloating 02/14/2012  . Upper abdominal pain 02/14/2012  . Allergic rhinitis, seasonal 02/15/2011  . COLITIS 02/20/2010  . Diarrhea 01/02/2010  . ABDOMINAL PAIN -GENERALIZED 01/02/2010  . PERSONAL HX COLONIC POLYPS 01/02/2010  . HYPOTHYROIDISM 12/28/2009  . COPD (chronic obstructive pulmonary disease) ([place]) 12/28/2009  . ARTHRITIS 12/28/2009        Geraldine Solar PT, DPT  [facility] [address] [place], [place], [address] Phone: [phone]   Fax:  [phone]  Name: STEWART PIMENTA MRN: [id number] Date of Birth: [birth year]

## 2018-02-17 ENCOUNTER — Other Ambulatory Visit: Payer: Self-pay | Admitting: Hematology and Oncology

## 2018-02-17 ENCOUNTER — Ambulatory Visit (HOSPITAL_COMMUNITY): Payer: Medicare Other

## 2018-02-17 ENCOUNTER — Encounter (HOSPITAL_COMMUNITY): Payer: Self-pay

## 2018-02-17 DIAGNOSIS — M6281 Muscle weakness (generalized): Secondary | ICD-10-CM

## 2018-02-17 DIAGNOSIS — R2689 Other abnormalities of gait and mobility: Secondary | ICD-10-CM

## 2018-02-17 DIAGNOSIS — M79662 Pain in left lower leg: Secondary | ICD-10-CM

## 2018-02-17 NOTE — Therapy (Signed)
[facility] [place], [place], [address] Phone: [phone]   Fax:  [phone]  Physical Therapy Treatment  Patient Details  Name: [name] MRN: [id number] Date of Birth: 03-23-1933 Referring Provider: Dr. Heath Lark   Encounter Date: 02/17/2018  PT End of Session - 02/17/18 1135    Visit Number  21    Number of Visits  30    Date for PT Re-Evaluation  02/28/18    Authorization Type  Medicare    Authorization Time Period  12/23/17 to 02/03/18; NEW: 01/31/18 to 02/28/18    PT Start Time  1120    PT Stop Time  1203    PT Time Calculation (min)  43 min    Equipment Utilized During Treatment  Gait belt    Activity Tolerance  Patient tolerated treatment well;Patient limited by fatigue    Behavior During Therapy  [facility] for tasks assessed/performed       Past Medical History:  Diagnosis Date  . COPD (chronic obstructive pulmonary disease) ([place])   . Depression   . Diverticulosis   . Family hx of colon cancer   . Fibromyalgia   . GERD (gastroesophageal reflux disease)   . Hemorrhoids   . Hiatal hernia   . History of shingles   . Hypothyroidism   . IBS (irritable bowel syndrome)   . Lymphocytic colitis   . Osteoporosis   . Ovarian cancer ([place])   . Paroxysmal atrial fibrillation ([facility])   . Psoriatic arthritis ([place])   . Rectal polyp   . Schatzki's ring   . Uterine polyp     Past Surgical History:  Procedure Laterality Date  . CHOLECYSTECTOMY  Dec 04, 2010  . COLONOSCOPY    . FEMUR IM NAIL Left 12/11/2013   Procedure: INTRAMEDULLARY (IM) NAIL FEMORAL;  Surgeon: [name], MD;  [facility];  Service: Orthopedics;  Laterality: Left;  . FRACTURE SURGERY Left 12/11/2013   hip  . IR FLUORO GUIDE PORT INSERTION RIGHT  07/22/2017  . IR PARACENTESIS  07/12/2017  . IR US GUIDE VASC ACCESS RIGHT  07/22/2017  . KNEE SURGERY Left    x 2  . TOTAL KNEE REVISION  08/06/2012   Procedure: TOTAL KNEE REVISION;   Surgeon: [name], MD;  [facility]: [facility];  Service: Orthopedics;  Laterality: Left;  Left Total Knee Arthroplasty Revision  . Uterine polypectomy      There were no vitals filed for this visit.  Subjective Assessment - 02/17/18 1135    Subjective  Pt states she had a good Mother's Day holiday. She woke up with some bil buttock pain again this morning and had to take a pain pill.     Patient Stated Goals  Would like to come to clinic to build up her strength and endurance    Currently in Pain?  Yes    Pain Score  4     Pain Location  Generalized    Pain Descriptors / Indicators  Aching;Dull    Pain Onset  More than a month ago    Pain Frequency  Intermittent    Aggravating Factors   nothing, walking, standing    Pain Relieving Factors  activity    Effect of Pain on Daily Activities  decreased ability to complete ADLs            [facility] Adult PT Treatment/Exercise - 02/17/18 0001      Knee/Hip Exercises: Standing   Forward  Lunges  Both;15 reps    Forward Lunges Limitations  BOSU dome up    Functional Squat  10 reps;5 seconds    Gait Training  gait with SPC x155f straight then [facility]    Balance Exercises - 02/17/18 1146      Balance Exercises: Standing   Tandem Gait  Forward;Foam/compliant surface;2 reps    Retro Gait  2 reps tandem    Sidestepping  Foam/compliant support;1 rep            PT Education - 02/17/18 1136    Education provided  Yes    Education Details  exercise technique, cotniue HEP    Person(s) Educated  Patient    Methods  Demonstration;Explanation    Comprehension  Verbalized understanding;Returned demonstration       PT Short Term Goals - 01/31/18 1034      PT SHORT TERM GOAL #1   Title  Patient will ambulated >250 feet with rolling walker during the 3 MWT to improve her ability to ambulate further within her home before needing to rest.    Baseline  4/26: 34031fno rest breaks    Time  3    Period  Weeks    Status  Achieved       PT SHORT TERM GOAL #2   Title  Patient with tolerate the standing position with upper extremity support for 5 minutes before needing to rest to improve her ability to perform standing functional activities and ADLs within her home environment.    Baseline  3/18: able to stand 5 mins with no UE support    Time  3    Period  Weeks    Status  Achieved      PT SHORT TERM GOAL #3   Title  Patient will increase MMT grades by 1/2 grade to show improvement in her strength and stability to perform functional activities.    Baseline  4/26: has improved by at least 1/2 grade throughout since initial evaluation    Time  3    Period  Weeks    Status  Achieved      PT SHORT TERM GOAL #4   Title  Patient will be able to stand on either foot for 10 seconds to exhibit improved balance and decreased risk for falls.    Baseline  4/26: 8 sec on RLE and 6 sec on LLE with no UE    Time  3    Period  Weeks    Status  On-going        PT Long Term Goals - 01/31/18 1035      PT LONG TERM GOAL #1   Title  Patient will ambulated >500 feet with rolling walker during the 3 MWT to improve her ability to ambulate further  in community to allow to her go enjoy going out for a meal.    Baseline  4/26: [id number] rest breaks    Time  6    Period  Weeks    Status  On-going      PT LONG TERM GOAL #2   Title  Patient with tolerate the standing position with upper extremity support for 10 minutes before needing to rest to improve her ability to stand and have a conversation with a friend or relative.    Baseline  4/10: able to stand statically for 10 mins this date with no UE support    Time  6    Period  Weeks    Status  Achieved      PT LONG TERM GOAL #3   Title  Patient will increase MMT grades by 1 grade to show continued improvement in her strength and stability to perform functional activities at home independently.    Baseline  4/26: see MMT    Time  6    Period  Weeks    Status  Partially Met       PT LONG TERM GOAL #4   Title  Patient will be able to stand on either foot for 15 seconds to exhibit improved balance and further decreased risk for falls.    Baseline  4/26: 8 sec on RLE and 6 sec on LLE with no UE    Time  6    Period  Weeks    Status  On-going      PT LONG TERM GOAL #5   Title  Pt will be able to perform 5xSTS in 12 sec or < with no UE in order to demo improved functional BLE strength and overall balance.    Baseline  4/26: 11.8 sec, no UE    Time  6    Period  Weeks    Status  Achieved      Additional Long Term Goals   Additional Long Term Goals  Yes      PT LONG TERM GOAL #6   Title  Pt will report requiring assistance for ADLs and IADLs at home 25% of the time or < to demonstrate improved overall functional strength, endurance, and balance in order to promote independence at home.    Baseline  4/10: feels that her nurse aide and dtr provide about 25% assistance for ADLs and IADLs    Time  6    Period  Weeks    Status  Achieved      PT LONG TERM GOAL #7   Title  Pt will be able to perform 13 STS during the 30 sec chair rise test in order to demo improved functional BLE strength and further decrease risk for falls.    Time  4    Period  Weeks    Status  New    Target Date  02/28/18      PT LONG TERM GOAL #8   Title  Pt will report being able to stand and ambulate in her kitchen for 20 mins without a rest break to allow her to make a meal with greater ease and maximize function and independence at home.     Time  4    Period  Weeks    Status  New    Target Date  02/28/18            Plan - 02/17/18 1203    Clinical Impression Statement  Continued with established POC focusing on muscular endurance, strength and overall balance. Gait with SPC is steadily improving as she is less unsteady and is able to ambulate almost 1 full lap around clinic without rest break. Progressed balance activities this date with good tolerance, just some increased SOB  which required frequent seated rest breaks. HR and SpO2 remained WFL throughout all session. Pt able to perform dynamic gait/balance activities outside of // bars this date, an improvement from previous sessions. Pt progressing steadily throughout and needs continued skilled PT intervention to address remaining deficits in order to further promote overall strength, balance, and tolerance to functional tasks.     Rehab Potential  Good  PT Frequency  3x / week    PT Duration  4 weeks    PT Treatment/Interventions  ADLs/Self Care Home Management;Gait training;Stair training;Functional mobility training;Therapeutic activities;Patient/family education;Balance training;Neuromuscular re-education;Therapeutic exercise;Manual techniques;Energy conservation    PT Next Visit Plan  continue gait with SPC for endurance training; continue to progress balance; stregnthening and endurance activities for hip/glutes strength; Allow adequate rest breaks for patient, monitor HR; continue manual for gluteal restrictions PRN    PT Home Exercise Plan  intial - lap walking at home; supine bridges; 3/18: supine clams with RTB; 3/25: SAQ; 4/5: standing abd, marching, LAQ, knee flexion; 4/15: supine figure 4, supine SKTC; 4/17: sidelying hip abd; 5/1: tandem stance and SLS    Consulted and Agree with Plan of Care  Patient       Patient will benefit from skilled therapeutic intervention in order to improve the following deficits and impairments:  Abnormal gait, Pain, Decreased mobility, Decreased coordination, Decreased activity tolerance, Decreased endurance, Decreased strength, Decreased balance, Difficulty walking  Visit Diagnosis: Muscle weakness (generalized)  Pain in left lower leg  Other abnormalities of gait and mobility     Problem List Patient Active Problem List   Diagnosis Date Noted  . Thrombocytopenia ([place]) 01/03/2018  . Anemia, chronic disease 12/13/2017  . Mild protein-calorie malnutrition ([place])  12/13/2017  . Peripheral neuropathy due to chemotherapy ([place]) 12/13/2017  . S/P thoracentesis   . Atrial fibrillation with RVR ([place]) 11/08/2017  . HCAP (healthcare-associated pneumonia) 11/08/2017  . Acute on chronic diastolic heart failure ([place]) 08/19/2017  . Pancytopenia, acquired ([place]) 08/15/2017  . Protein-calorie malnutrition, moderate ([place]) 07/30/2017  . Generalized weakness 07/30/2017  . Antineoplastic chemotherapy induced pancytopenia ([place]) 07/20/2017  . Ovarian CA, right ([place]) 07/18/2017  . PNA (pneumonia) 07/07/2017  . Ascites 06/30/2017  . Ascites, malignant 06/30/2017  . Right ovarian cyst 01/18/2017  . Elevated CA-125 01/18/2017  . Depression 12/23/2015  . Peripheral edema 08/23/2015  . Fracture of hip, left, closed ([place]) 12/08/2013  . Hip fracture ([place]) 12/08/2013  . PAD (peripheral artery disease) ([place]) 11/25/2013  . Encounter for therapeutic drug monitoring 11/09/2013  . Atrial fibrillation ([place]) 05/07/2013  . Cough 03/20/2013  . Total knee replacement status 10/07/2012  . Knee pain 10/07/2012  . Knee stiffness 10/07/2012  . Tachycardia 09/17/2012  . Chest pain 09/17/2012  . Difficulty in walking(719.7) 09/16/2012  . Muscle weakness (generalized) 09/16/2012  . Postop Acute blood loss anemia 08/08/2012  . Instability of prosthetic knee ([place]) 08/06/2012  . Bloating 02/14/2012  . Upper abdominal pain 02/14/2012  . Allergic rhinitis, seasonal 02/15/2011  . COLITIS 02/20/2010  . Diarrhea 01/02/2010  . ABDOMINAL PAIN -GENERALIZED 01/02/2010  . PERSONAL HX COLONIC POLYPS 01/02/2010  . HYPOTHYROIDISM 12/28/2009  . COPD (chronic obstructive pulmonary disease) ([place]) 12/28/2009  . ARTHRITIS 12/28/2009       Geraldine Solar PT, DPT  [facility] [address] [place], [place], [address] Phone: [phone]   Fax:  [phone]  Name: MAKENLEY SHIMP MRN: [id number] Date of Birth: [birth year]

## 2018-02-17 NOTE — Progress Notes (Signed)
Cardiology Office Note  Date: 02/19/2018   ID: [name], DOB 07/18/1933, MRN [id number]  PCP: Asencion Noble, MD  Primary Cardiologist: Rozann Lesches, MD   Chief Complaint  Patient presents with  . PAF    History of Present Illness: [name] is an 84 y.o. female last seen by Ms. Strader PA-C in April.  She presents with family member for a follow-up visit.  States that in general her feeling of palpitations has improved on higher dose Cardizem CD, however she continues to run a fairly fast heart rate in sinus rhythm as well.  She is undergoing rehabilitation, working on balance.  States that she also has follow-up with surgical oncology pending.  Last visit Cardizem CD was increased to 120 mg twice daily for better heart rate control of atrial fibrillation.  She was continued on Xarelto.  I reviewed her ECG today which shows sinus tachycardia with low voltage and nonspecific ST-T changes.  Past Medical History:  Diagnosis Date  . COPD (chronic obstructive pulmonary disease) ([place])   . Depression   . Diverticulosis   . Family hx of colon cancer   . Fibromyalgia   . GERD (gastroesophageal reflux disease)   . Hemorrhoids   . Hiatal hernia   . History of shingles   . Hypothyroidism   . IBS (irritable bowel syndrome)   . Lymphocytic colitis   . Osteoporosis   . Ovarian cancer ([place])   . Paroxysmal atrial fibrillation ([facility])   . Psoriatic arthritis ([place])   . Rectal polyp   . Schatzki's ring   . Uterine polyp     Past Surgical History:  Procedure Laterality Date  . CHOLECYSTECTOMY  Dec 04, 2010  . COLONOSCOPY    . FEMUR IM NAIL Left 12/11/2013   Procedure: INTRAMEDULLARY (IM) NAIL FEMORAL;  Surgeon: [name], MD;  Location: [facility];  Service: Orthopedics;  Laterality: Left;  . FRACTURE SURGERY Left 12/11/2013   hip  . IR FLUORO GUIDE PORT INSERTION RIGHT  07/22/2017  . IR PARACENTESIS  07/12/2017  . IR US GUIDE VASC ACCESS RIGHT  07/22/2017  . KNEE  SURGERY Left    x 2  . TOTAL KNEE REVISION  08/06/2012   Procedure: TOTAL KNEE REVISION;  Surgeon: [name], MD;  Location: [facility];  Service: Orthopedics;  Laterality: Left;  Left Total Knee Arthroplasty Revision  . Uterine polypectomy      Current Outpatient Medications  Medication Sig Dispense Refill  . albuterol (PROAIR HFA) 108 (90 Base) MCG/ACT inhaler Inhale 2 puffs into the lungs every 6 (six) hours as needed for wheezing or shortness of breath. 1 Inhaler 3  . ALPRAZolam (XANAX) 0.25 MG tablet Take 1 tablet (0.25 mg total) by mouth at bedtime as needed for anxiety. 5 tablet 0  . azelastine (ASTELIN) 0.1 % nasal spray Place 2 sprays into both nostrils 2 (two) times daily as needed for allergies.     . Cholecalciferol (VITAMIN D) 2000 units CAPS Take 2,000 Units by mouth daily.    Marland Kitchen diltiazem (CARTIA XT) 120 MG 24 hr capsule Take 1 capsule (120 mg total) by mouth 2 (two) times daily. TAKE 1 CAPSULE BY MOUTH EVERY DAY (Patient taking differently: Take 120 mg by mouth 2 (two) times daily. ) 180 capsule 3  . diphenhydrAMINE (BENADRYL) 25 mg capsule Take 25 mg by mouth every 6 (six) hours as needed for itching.    . diphenoxylate-atropine (LOMOTIL) 2.5-0.025 MG tablet Take 1  tablet by mouth 4 (four) times daily. (Patient taking differently: Take 1 tablet by mouth 4 (four) times daily as needed for diarrhea or loose stools. ) 360 tablet 1  . DULoxetine (CYMBALTA) 30 MG capsule TAKE 1 CAPSULE(30 MG) BY MOUTH DAILY 30 capsule 0  . fluticasone (FLONASE) 50 MCG/ACT nasal spray Place 2 sprays into both nostrils 2 (two) times daily. For nasal congestion    . furosemide (LASIX) 20 MG tablet Take 1 tablet (20 mg total) by mouth daily. 90 tablet 9  . HYDROcodone-acetaminophen (NORCO) 10-325 MG tablet Take 1 tablet by mouth every 4 (four) hours as needed. 90 tablet 0  . ipratropium-albuterol (DUONEB) 0.5-2.5 (3) MG/3ML SOLN Take 3 mLs by nebulization 3 (three) times daily. (Patient taking  differently: Take 3 mLs by nebulization every 6 (six) hours as needed (cough or SOB). As needed) 360 mL   . levothyroxine (SYNTHROID, LEVOTHROID) 137 MCG tablet Take 137 mcg by mouth every morning. For thyroid therapy    . lidocaine-prilocaine (EMLA) cream Apply 1 application topically as needed. 30 g 6  . loperamide (IMODIUM) 2 MG capsule Take 2 capsules (4 mg total) by mouth 3 (three) times daily. 30 capsule 0  . mirtazapine (REMERON) 15 MG tablet Take 1 tablet (15 mg total) by mouth at bedtime. 30 tablet 1  . morphine (MS CONTIN) 15 MG 12 hr tablet Take 1 tablet (15 mg total) by mouth every 12 (twelve) hours. 60 tablet 0  . polyvinyl alcohol (LIQUIFILM TEARS) 1.4 % ophthalmic solution 1 drop 2 (two) times daily as needed for dry eyes.    . potassium chloride (K-DUR) 10 MEQ tablet Take 10 mEq by mouth daily.    . Tiotropium Bromide-Olodaterol (STIOLTO RESPIMAT) 2.5-2.5 MCG/ACT AERS Inhale 2 puffs into the lungs daily. 1 Inhaler 3  . XARELTO 20 MG TABS tablet TAKE 1 TABLET BY MOUTH EVERY DAY WITH SUPPER 90 tablet 0   No current facility-administered medications for this visit.    Allergies:  Codeine   Social History: The patient  reports that she quit smoking about 37 years ago. Her smoking use included cigarettes. She has a 20.00 pack-year smoking history. She has never used smokeless tobacco. She reports that she drinks alcohol. She reports that she does not use drugs.   ROS:  Please see the history of present illness. Otherwise, complete review of systems is positive for dyspnea on exertion.  All other systems are reviewed and negative.   Physical Exam: VS:  BP 120/60 (BP Location: Right Arm)   Pulse (!) 106   Wt 167 lb (75.8 kg)   SpO2 97%   BMI 28.67 kg/m , BMI Body mass index is 28.67 kg/m.  Wt Readings from Last 3 Encounters:  02/19/18 167 lb (75.8 kg)  02/04/18 165 lb 12.8 oz (75.2 kg)  01/08/18 161 lb (73 kg)    General: Elderly woman, using a walker. HEENT: Conjunctiva  and lids normal, oropharynx clear. Neck: Supple, no elevated JVP or carotid bruits, no thyromegaly. Lungs: Decreased breath sounds without wheezing, nonlabored breathing at rest. Cardiac: Regular rate and rhythm, no S3, soft systolic murmur. Abdomen: Soft, nontender, bowel sounds present. Extremities: Trace ankle edema, distal pulses 2+. Skin: Warm and dry. Musculoskeletal: No kyphosis. Neuropsychiatric: Alert and oriented x3, affect grossly appropriate.  ECG: I personally reviewed the tracing from 01/08/2018 which showed sinus rhythm with low voltage.  Recent Labwork: 08/12/2017: NT-Pro BNP 837 11/10/2017: TSH 0.388 11/12/2017: B Natriuretic Peptide 76.7 11/15/2017: Magnesium 1.3 02/04/2018: ALT  9; AST 16; BUN 14; Creatinine 0.80; Hemoglobin 11.1; Platelet Count 87; Potassium 4.1; Sodium 137     Component Value Date/Time   CHOL 147 11/14/2017 1413    Other Studies Reviewed Today:  Echocardiogram 08/20/2017: Study Conclusions  - Left ventricle: The cavity size was normal. Wall thickness was   normal. Systolic function was normal. The estimated ejection   fraction was in the range of 60% to 65%. Wall motion was normal;   there were no regional wall motion abnormalities. Doppler   parameters are consistent with abnormal left ventricular   relaxation (grade 1 diastolic dysfunction). - Aortic valve: There was no stenosis. There was trivial   regurgitation.  - Mitral valve: Mildly calcified annulus. There was no significant   regurgitation. - Right ventricle: The cavity size was normal. Systolic function   was normal. - Tricuspid valve: Peak RV-RA gradient (S): 27 mm Hg. - Pulmonary arteries: PA peak pressure: 30 mm Hg (S). - Inferior vena cava: The vessel was normal in size. The   respirophasic diameter changes were in the normal range (= 50%),   consistent with normal central venous pressure.  Impressions:  - Normal LV size with EF 60-65%. Normal RV size and systolic    function. No significant valvular abnormalities.  Assessment and Plan:  1.  Paroxysmal atrial fibrillation, currently in sinus rhythm but tends to run fast heart rates in general in the setting of comorbid illness.  She does feel better in terms of palpitations.  Plan to continue Xarelto for stroke prophylaxis, increase Cardizem CD to 180 mg in the morning and 120 mg in the afternoon.  2.  Chronic diastolic heart failure.  Weight is up about 2 pounds.  Continue current dose of Lasix.  3.  Ovarian cancer status post chemotherapy with pending surgical oncology evaluation.  4.  COPD, reports stable use of inhalers and follow-up with Dr. Willey Blade.  Current medicines were reviewed with the patient today.   Orders Placed This Encounter  Procedures  . EKG 12-Lead    Disposition: Follow-up in 3 months.  Signed, Satira Sark, MD, [facility] 02/19/2018 1:37 PM    [facility] at [facility] [address], [place], [place] [address] Phone: [phone]; Fax: [phone]

## 2018-02-18 ENCOUNTER — Ambulatory Visit: Payer: Self-pay | Admitting: Emergency Medicine

## 2018-02-19 ENCOUNTER — Ambulatory Visit (INDEPENDENT_AMBULATORY_CARE_PROVIDER_SITE_OTHER): Payer: Medicare Other | Admitting: Cardiology

## 2018-02-19 ENCOUNTER — Encounter (HOSPITAL_COMMUNITY): Payer: Self-pay

## 2018-02-19 ENCOUNTER — Ambulatory Visit (HOSPITAL_COMMUNITY): Payer: Medicare Other

## 2018-02-19 ENCOUNTER — Encounter: Payer: Self-pay | Admitting: Cardiology

## 2018-02-19 VITALS — BP 120/60 | HR 106 | Wt 167.0 lb

## 2018-02-19 DIAGNOSIS — M79662 Pain in left lower leg: Secondary | ICD-10-CM

## 2018-02-19 DIAGNOSIS — M6281 Muscle weakness (generalized): Secondary | ICD-10-CM

## 2018-02-19 DIAGNOSIS — I480 Paroxysmal atrial fibrillation: Secondary | ICD-10-CM | POA: Diagnosis not present

## 2018-02-19 DIAGNOSIS — I5032 Chronic diastolic (congestive) heart failure: Secondary | ICD-10-CM | POA: Diagnosis not present

## 2018-02-19 DIAGNOSIS — J449 Chronic obstructive pulmonary disease, unspecified: Secondary | ICD-10-CM | POA: Diagnosis not present

## 2018-02-19 DIAGNOSIS — C569 Malignant neoplasm of unspecified ovary: Secondary | ICD-10-CM

## 2018-02-19 DIAGNOSIS — R2689 Other abnormalities of gait and mobility: Secondary | ICD-10-CM

## 2018-02-19 MED ORDER — DILTIAZEM HCL ER COATED BEADS 180 MG PO CP24: 180.0000 mg | ORAL_CAPSULE | ORAL | 3 refills | Status: DC

## 2018-02-19 MED ORDER — DILTIAZEM HCL ER COATED BEADS 120 MG PO CP24: 120.0000 mg | ORAL_CAPSULE | Freq: Every evening | ORAL | 3 refills | Status: DC

## 2018-02-19 NOTE — Therapy (Signed)
[place], [place], [address] Phone: [phone]   Fax:  [phone]  Physical Therapy Treatment  Patient Details  Name: [name] MRN: [id number] Date of Birth: Jan 10, 1933 Referring Provider: Dr. Heath Lark   Encounter Date: 02/19/2018  PT End of Session - 02/19/18 1125    Visit Number  22    Number of Visits  30    Date for PT Re-Evaluation  02/28/18    Authorization Type  Medicare    Authorization Time Period  12/23/17 to 02/03/18; NEW: 01/31/18 to 02/28/18    PT Start Time  1118    PT Stop Time  1202    PT Time Calculation (min)  44 min    Equipment Utilized During Treatment  Gait belt    Activity Tolerance  Patient tolerated treatment well;Patient limited by fatigue    Behavior During Therapy  [facility] for tasks assessed/performed       Past Medical History:  Diagnosis Date  . COPD (chronic obstructive pulmonary disease) ([place])   . Depression   . Diverticulosis   . Family hx of colon cancer   . Fibromyalgia   . GERD (gastroesophageal reflux disease)   . Hemorrhoids   . Hiatal hernia   . History of shingles   . Hypothyroidism   . IBS (irritable bowel syndrome)   . Lymphocytic colitis   . Osteoporosis   . Ovarian cancer ([place])   . Paroxysmal atrial fibrillation ([facility])   . Psoriatic arthritis ([place])   . Rectal polyp   . Schatzki's ring   . Uterine polyp     Past Surgical History:  Procedure Laterality Date  . CHOLECYSTECTOMY  Dec 04, 2010  . COLONOSCOPY    . FEMUR IM NAIL Left 12/11/2013   Procedure: INTRAMEDULLARY (IM) NAIL FEMORAL;  Surgeon: [name], MD;  [facility];  Service: Orthopedics;  Laterality: Left;  . FRACTURE SURGERY Left 12/11/2013   hip  . IR FLUORO GUIDE PORT INSERTION RIGHT  07/22/2017  . IR PARACENTESIS  07/12/2017  . IR US GUIDE VASC ACCESS RIGHT  07/22/2017  . KNEE SURGERY Left    x 2  . TOTAL KNEE REVISION  08/06/2012   Procedure: TOTAL KNEE REVISION;   Surgeon: [name], MD;  Location: [facility];  Service: Orthopedics;  Laterality: Left;  Left Total Knee Arthroplasty Revision  . Uterine polypectomy      There were no vitals filed for this visit.  Subjective Assessment - 02/19/18 1126    Subjective  Pt states that she continues to wake up with bil hip/buttock pain. She states that she still does her stretches which help her but the pain comes back and she has to take a pain pill.    Patient Stated Goals  Would like to come to clinic to build up her strength and endurance    Currently in Pain?  Yes    Pain Score  4     Pain Location  Buttocks    Pain Orientation  Right;Left    Pain Descriptors / Indicators  Aching;Dull    Pain Type  Chronic pain    Pain Onset  More than a month ago    Pain Frequency  Intermittent    Aggravating Factors   nothing, walking, standing    Pain Relieving Factors  activity    Effect of Pain on Daily Activities  decreased ability to complete ADLs  [place] Adult PT Treatment/Exercise - 02/19/18 0001      Knee/Hip Exercises: Standing   Heel Raises  Both;15 reps    Heel Raises Limitations  heel and toe on foam    Forward Lunges  Both;15 reps    Forward Lunges Limitations  BOSU dome up    Functional Squat  15 reps;5 seconds    Functional Squat Limitations  on foam, chair behind for form    Gait Training  gait with SPC x274f with 1 standing rest break after 192f     Balance Exercises - 02/19/18 1142      Balance Exercises: Standing   Tandem Stance  Eyes open;Foam/compliant surface front foot on bosu, rear foot on airex +palov press RTB x15    Sidestepping  Foam/compliant support;1 rep             PT Short Term Goals - 01/31/18 1034      PT SHORT TERM GOAL #1   Title  Patient will ambulated >250 feet with rolling walker during the 3 MWT to improve her ability to ambulate further within her home before needing to rest.    Baseline  4/26: [id number] rest breaks    Time  3     Period  Weeks    Status  Achieved      PT SHORT TERM GOAL #2   Title  Patient with tolerate the standing position with upper extremity support for 5 minutes before needing to rest to improve her ability to perform standing functional activities and ADLs within her home environment.    Baseline  3/18: able to stand 5 mins with no UE support    Time  3    Period  Weeks    Status  Achieved      PT SHORT TERM GOAL #3   Title  Patient will increase MMT grades by 1/2 grade to show improvement in her strength and stability to perform functional activities.    Baseline  4/26: has improved by at least 1/2 grade throughout since initial evaluation    Time  3    Period  Weeks    Status  Achieved      PT SHORT TERM GOAL #4   Title  Patient will be able to stand on either foot for 10 seconds to exhibit improved balance and decreased risk for falls.    Baseline  4/26: 8 sec on RLE and 6 sec on LLE with no UE    Time  3    Period  Weeks    Status  On-going        PT Long Term Goals - 01/31/18 1035      PT LONG TERM GOAL #1   Title  Patient will ambulated >500 feet with rolling walker during the 3 MWT to improve her ability to ambulate further  in community to allow to her go enjoy going out for a meal.    Baseline  4/26: [id number] rest breaks    Time  6    Period  Weeks    Status  On-going      PT LONG TERM GOAL #2   Title  Patient with tolerate the standing position with upper extremity support for 10 minutes before needing to rest to improve her ability to stand and have a conversation with a friend or relative.    Baseline  4/10: able to stand statically for 10 mins this date with no UE support  Time  6    Period  Weeks    Status  Achieved      PT LONG TERM GOAL #3   Title  Patient will increase MMT grades by 1 grade to show continued improvement in her strength and stability to perform functional activities at home independently.    Baseline  4/26: see MMT    Time  6    Period   Weeks    Status  Partially Met      PT LONG TERM GOAL #4   Title  Patient will be able to stand on either foot for 15 seconds to exhibit improved balance and further decreased risk for falls.    Baseline  4/26: 8 sec on RLE and 6 sec on LLE with no UE    Time  6    Period  Weeks    Status  On-going      PT LONG TERM GOAL #5   Title  Pt will be able to perform 5xSTS in 12 sec or < with no UE in order to demo improved functional BLE strength and overall balance.    Baseline  4/26: 11.8 sec, no UE    Time  6    Period  Weeks    Status  Achieved      Additional Long Term Goals   Additional Long Term Goals  Yes      PT LONG TERM GOAL #6   Title  Pt will report requiring assistance for ADLs and IADLs at home 25% of the time or < to demonstrate improved overall functional strength, endurance, and balance in order to promote independence at home.    Baseline  4/10: feels that her nurse aide and dtr provide about 25% assistance for ADLs and IADLs    Time  6    Period  Weeks    Status  Achieved      PT LONG TERM GOAL #7   Title  Pt will be able to perform 13 STS during the 30 sec chair rise test in order to demo improved functional BLE strength and further decrease risk for falls.    Time  4    Period  Weeks    Status  New    Target Date  02/28/18      PT LONG TERM GOAL #8   Title  Pt will report being able to stand and ambulate in her kitchen for 20 mins without a rest break to allow her to make a meal with greater ease and maximize function and independence at home.     Time  4    Period  Weeks    Status  New    Target Date  02/28/18            Plan - 02/19/18 1202    Clinical Impression Statement  Pt continues to make steady improvements throughout all. Able to ambulate 28f with SPC without sitting down, but did require brief standing rest break this date. Progressed tandem balance to front foot on BOSU and rear foot on foam with palov press. Continues to be challenged  with sidestepping on foam. Cardiovascular endurance is main limitation at this point as her HR got up to 138bpm at its max and she required slightly longer rest breaks to recover; O2 WNL this date.     Rehab Potential  Good    PT Frequency  3x / week    PT Duration  4 weeks  PT Treatment/Interventions  ADLs/Self Care Home Management;Gait training;Stair training;Functional mobility training;Therapeutic activities;Patient/family education;Balance training;Neuromuscular re-education;Therapeutic exercise;Manual techniques;Energy conservation    PT Next Visit Plan  continue gait with SPC for endurance training; add retro gait +head turns; continue to progress balance; stregnthening and endurance activities for hip/glutes strength; Allow adequate rest breaks for patient, monitor HR; continue manual for gluteal restrictions PRN    PT Home Exercise Plan  intial - lap walking at home; supine bridges; 3/18: supine clams with RTB; 3/25: SAQ; 4/5: standing abd, marching, LAQ, knee flexion; 4/15: supine figure 4, supine SKTC; 4/17: sidelying hip abd; 5/1: tandem stance and SLS    Consulted and Agree with Plan of Care  Patient       Patient will benefit from skilled therapeutic intervention in order to improve the following deficits and impairments:  Abnormal gait, Pain, Decreased mobility, Decreased coordination, Decreased activity tolerance, Decreased endurance, Decreased strength, Decreased balance, Difficulty walking  Visit Diagnosis: Muscle weakness (generalized)  Pain in left lower leg  Other abnormalities of gait and mobility     Problem List Patient Active Problem List   Diagnosis Date Noted  . Thrombocytopenia ([place]) 01/03/2018  . Anemia, chronic disease 12/13/2017  . Mild protein-calorie malnutrition ([place]) 12/13/2017  . Peripheral neuropathy due to chemotherapy ([place]) 12/13/2017  . S/P thoracentesis   . Atrial fibrillation with RVR ([place]) 11/08/2017  . HCAP (healthcare-associated  pneumonia) 11/08/2017  . Acute on chronic diastolic heart failure ([place]) 08/19/2017  . Pancytopenia, acquired ([place]) 08/15/2017  . Protein-calorie malnutrition, moderate ([place]) 07/30/2017  . Generalized weakness 07/30/2017  . Antineoplastic chemotherapy induced pancytopenia ([place]) 07/20/2017  . Ovarian CA, right ([place]) 07/18/2017  . PNA (pneumonia) 07/07/2017  . Ascites 06/30/2017  . Ascites, malignant 06/30/2017  . Right ovarian cyst 01/18/2017  . Elevated CA-125 01/18/2017  . Depression 12/23/2015  . Peripheral edema 08/23/2015  . Fracture of hip, left, closed ([place]) 12/08/2013  . Hip fracture ([place]) 12/08/2013  . PAD (peripheral artery disease) ([place]) 11/25/2013  . Encounter for therapeutic drug monitoring 11/09/2013  . Atrial fibrillation ([place]) 05/07/2013  . Cough 03/20/2013  . Total knee replacement status 10/07/2012  . Knee pain 10/07/2012  . Knee stiffness 10/07/2012  . Tachycardia 09/17/2012  . Chest pain 09/17/2012  . Difficulty in walking(719.7) 09/16/2012  . Muscle weakness (generalized) 09/16/2012  . Postop Acute blood loss anemia 08/08/2012  . Instability of prosthetic knee ([place]) 08/06/2012  . Bloating 02/14/2012  . Upper abdominal pain 02/14/2012  . Allergic rhinitis, seasonal 02/15/2011  . COLITIS 02/20/2010  . Diarrhea 01/02/2010  . ABDOMINAL PAIN -GENERALIZED 01/02/2010  . PERSONAL HX COLONIC POLYPS 01/02/2010  . HYPOTHYROIDISM 12/28/2009  . COPD (chronic obstructive pulmonary disease) ([place]) 12/28/2009  . ARTHRITIS 12/28/2009       Geraldine Solar PT, DPT  [facility] [address] [place], [place], [address] Phone: [phone]   Fax:  [phone]  Name: [name] MRN: [id number] Date of Birth: 09-23-1933

## 2018-02-19 NOTE — Patient Instructions (Addendum)
Medication Instructions:   Your physician has recommended you make the following change in your medication:   Increase Diltiazem ER to 180 mg in the morning and 120 mg in the evening.  Continue all other medications the same.  Labwork:  NONE  Testing/Procedures:  NONE  Follow-Up:  Your physician recommends that you schedule a follow-up appointment in: 3 months.  Any Other Special Instructions Will Be Listed Below (If Applicable).  If you need a refill on your cardiac medications before your next appointment, please call your pharmacy.

## 2018-02-21 ENCOUNTER — Ambulatory Visit (HOSPITAL_COMMUNITY): Payer: Medicare Other

## 2018-02-21 ENCOUNTER — Encounter (HOSPITAL_COMMUNITY): Payer: Self-pay

## 2018-02-21 DIAGNOSIS — R2689 Other abnormalities of gait and mobility: Secondary | ICD-10-CM

## 2018-02-21 DIAGNOSIS — M6281 Muscle weakness (generalized): Secondary | ICD-10-CM

## 2018-02-21 DIAGNOSIS — M79662 Pain in left lower leg: Secondary | ICD-10-CM

## 2018-02-21 NOTE — Therapy (Signed)
[place], [place], [address] Phone: [phone]   Fax:  [phone]  Physical Therapy Treatment  Patient Details  Name: [name] MRN: [id number] Date of Birth: [birth year] Referring Provider: Dr. Heath Lark   Encounter Date: 02/21/2018  PT End of Session - 02/21/18 1038    Visit Number  23    Number of Visits  30    Date for PT Re-Evaluation  02/28/18    Authorization Type  Medicare    Authorization Time Period  12/23/17 to 02/03/18; NEW: 01/31/18 to 02/28/18    PT Start Time  1034    PT Stop Time  1114    PT Time Calculation (min)  40 min    Equipment Utilized During Treatment  Gait belt    Activity Tolerance  Patient tolerated treatment well;Patient limited by fatigue    Behavior During Therapy  [facility] for tasks assessed/performed       Past Medical History:  Diagnosis Date  . COPD (chronic obstructive pulmonary disease) ([place])   . Depression   . Diverticulosis   . Family hx of colon cancer   . Fibromyalgia   . GERD (gastroesophageal reflux disease)   . Hemorrhoids   . Hiatal hernia   . History of shingles   . Hypothyroidism   . IBS (irritable bowel syndrome)   . Lymphocytic colitis   . Osteoporosis   . Ovarian cancer ([place])   . Paroxysmal atrial fibrillation (HCC)   . Psoriatic arthritis ([place])   . Rectal polyp   . Schatzki's ring   . Uterine polyp     Past Surgical History:  Procedure Laterality Date  . CHOLECYSTECTOMY  Dec 04, 2010  . COLONOSCOPY    . FEMUR IM NAIL Left 12/11/2013   Procedure: INTRAMEDULLARY (IM) NAIL FEMORAL;  Surgeon: [name], MD;  Location: [facility];  Service: Orthopedics;  Laterality: Left;  . FRACTURE SURGERY Left 12/11/2013   hip  . IR FLUORO GUIDE PORT INSERTION RIGHT  07/22/2017  . IR PARACENTESIS  07/12/2017  . IR US GUIDE VASC ACCESS RIGHT  07/22/2017  . KNEE SURGERY Left    x 2  . TOTAL KNEE REVISION  08/06/2012   Procedure: TOTAL KNEE REVISION;   Surgeon: [name], MD;  Location: [facility];  Service: Orthopedics;  Laterality: Left;  Left Total Knee Arthroplasty Revision  . Uterine polypectomy      There were no vitals filed for this visit.  Subjective Assessment - 02/21/18 1037    Subjective  Pt states that her doctor increased her cardizem to help with her breathing. She states that she is having some inreased SOB this date and thinks it might be related to that, but the increased medication is supposed to help with that.     Patient Stated Goals  Would like to come to clinic to build up her strength and endurance    Currently in Pain?  No/denies    Pain Onset  More than a month ago            [facility] Adult PT Treatment/Exercise - 02/21/18 0001      Knee/Hip Exercises: Standing   Gait Training  gait with SPC x153f with standing 1 rest break and then seated break due to increased HR      Balance Exercises - 02/21/18 1055      Balance Exercises: Standing   Tandem Stance  Eyes closed;Foam/compliant surface;Intermittent upper extremity support;5 reps;10  secs    SLS  Eyes open;Solid surface;Intermittent upper extremity support;5 reps;10 secs    SLS with Vectors  Solid surface;Intermittent upper extremity assist;5 reps           PT Education - 02/21/18 1038    Education provided  Yes    Education Details  exercise technique, proper breathing technique    Person(s) Educated  Patient    Methods  Explanation;Demonstration    Comprehension  Verbalized understanding       PT Short Term Goals - 01/31/18 1034      PT SHORT TERM GOAL #1   Title  Patient will ambulated >250 feet with rolling walker during the 3 MWT to improve her ability to ambulate further within her home before needing to rest.    Baseline  4/26: 320f, no rest breaks    Time  3    Period  Weeks    Status  Achieved      PT SHORT TERM GOAL #2   Title  Patient with tolerate the standing position with upper extremity support for 5 minutes before  needing to rest to improve her ability to perform standing functional activities and ADLs within her home environment.    Baseline  3/18: able to stand 5 mins with no UE support    Time  3    Period  Weeks    Status  Achieved      PT SHORT TERM GOAL #3   Title  Patient will increase MMT grades by 1/2 grade to show improvement in her strength and stability to perform functional activities.    Baseline  4/26: has improved by at least 1/2 grade throughout since initial evaluation    Time  3    Period  Weeks    Status  Achieved      PT SHORT TERM GOAL #4   Title  Patient will be able to stand on either foot for 10 seconds to exhibit improved balance and decreased risk for falls.    Baseline  4/26: 8 sec on RLE and 6 sec on LLE with no UE    Time  3    Period  Weeks    Status  On-going        PT Long Term Goals - 01/31/18 1035      PT LONG TERM GOAL #1   Title  Patient will ambulated >500 feet with rolling walker during the 3 MWT to improve her ability to ambulate further  in community to allow to her go enjoy going out for a meal.    Baseline  4/26: 3458f no rest breaks    Time  6    Period  Weeks    Status  On-going      PT LONG TERM GOAL #2   Title  Patient with tolerate the standing position with upper extremity support for 10 minutes before needing to rest to improve her ability to stand and have a conversation with a friend or relative.    Baseline  4/10: able to stand statically for 10 mins this date with no UE support    Time  6    Period  Weeks    Status  Achieved      PT LONG TERM GOAL #3   Title  Patient will increase MMT grades by 1 grade to show continued improvement in her strength and stability to perform functional activities at home independently.    Baseline  4/26: see MMT  Time  6    Period  Weeks    Status  Partially Met      PT LONG TERM GOAL #4   Title  Patient will be able to stand on either foot for 15 seconds to exhibit improved balance and  further decreased risk for falls.    Baseline  4/26: 8 sec on RLE and 6 sec on LLE with no UE    Time  6    Period  Weeks    Status  On-going      PT LONG TERM GOAL #5   Title  Pt will be able to perform 5xSTS in 12 sec or < with no UE in order to demo improved functional BLE strength and overall balance.    Baseline  4/26: 11.8 sec, no UE    Time  6    Period  Weeks    Status  Achieved      Additional Long Term Goals   Additional Long Term Goals  Yes      PT LONG TERM GOAL #6   Title  Pt will report requiring assistance for ADLs and IADLs at home 25% of the time or < to demonstrate improved overall functional strength, endurance, and balance in order to promote independence at home.    Baseline  4/10: feels that her nurse aide and dtr provide about 25% assistance for ADLs and IADLs    Time  6    Period  Weeks    Status  Achieved      PT LONG TERM GOAL #7   Title  Pt will be able to perform 13 STS during the 30 sec chair rise test in order to demo improved functional BLE strength and further decrease risk for falls.    Time  4    Period  Weeks    Status  New    Target Date  02/28/18      PT LONG TERM GOAL #8   Title  Pt will report being able to stand and ambulate in her kitchen for 20 mins without a rest break to allow her to make a meal with greater ease and maximize function and independence at home.     Time  4    Period  Weeks    Status  New    Target Date  02/28/18            Plan - 02/21/18 1116    Clinical Impression Statement  Session limited this date due to increased reports of SOB. Continued with gait training with SPC for endurance training. Pt only able to ambulate 122f with SPC before requiring standing rest break due to SOB; HR increased to 125 and O2 decreased to 02%. O2 slowly recovered during standing rest break but HR did not so had pt take seated rest break and then her HR decreased to high 90s, low 100s (normal for pt). Due to increased SOB, mainly  performed balance activities in standing and held dynamic gait activities this date so as to not be limited as much by her SOB. She continues to be challenged with balance activities and requires intermittent UE support, but overall, she has progressed nicely. Progressed to tandem stance on foam with EC. Continue as planned, progressing as able within cardiovascular limitations. PT only charged for 2 units this date due to increaed rest break times required today d/t cardiovascular limitations.    Rehab Potential  Good    PT Frequency  3x /  week    PT Duration  4 weeks    PT Treatment/Interventions  ADLs/Self Care Home Management;Gait training;Stair training;Functional mobility training;Therapeutic activities;Patient/family education;Balance training;Neuromuscular re-education;Therapeutic exercise;Manual techniques;Energy conservation    PT Next Visit Plan  continue gait with SPC for endurance training; add retro gait +head turns; continue to progress balance; stregnthening and endurance activities for hip/glutes strength; Allow adequate rest breaks for patient, monitor HR; continue manual for gluteal restrictions PRN    PT Home Exercise Plan  intial - lap walking at home; supine bridges; 3/18: supine clams with RTB; 3/25: SAQ; 4/5: standing abd, marching, LAQ, knee flexion; 4/15: supine figure 4, supine SKTC; 4/17: sidelying hip abd; 5/1: tandem stance and SLS    Consulted and Agree with Plan of Care  Patient       Patient will benefit from skilled therapeutic intervention in order to improve the following deficits and impairments:  Abnormal gait, Pain, Decreased mobility, Decreased coordination, Decreased activity tolerance, Decreased endurance, Decreased strength, Decreased balance, Difficulty walking  Visit Diagnosis: Muscle weakness (generalized)  Pain in left lower leg  Other abnormalities of gait and mobility     Problem List Patient Active Problem List   Diagnosis Date Noted  .  Thrombocytopenia ([place]) 01/03/2018  . Anemia, chronic disease 12/13/2017  . Mild protein-calorie malnutrition ([place]) 12/13/2017  . Peripheral neuropathy due to chemotherapy ([place]) 12/13/2017  . S/P thoracentesis   . Atrial fibrillation with RVR ([place]) 11/08/2017  . HCAP (healthcare-associated pneumonia) 11/08/2017  . Acute on chronic diastolic heart failure ([place]) 08/19/2017  . Pancytopenia, acquired ([place]) 08/15/2017  . Protein-calorie malnutrition, moderate ([place]) 07/30/2017  . Generalized weakness 07/30/2017  . Antineoplastic chemotherapy induced pancytopenia ([place]) 07/20/2017  . Ovarian CA, right ([place]) 07/18/2017  . PNA (pneumonia) 07/07/2017  . Ascites 06/30/2017  . Ascites, malignant 06/30/2017  . Right ovarian cyst 01/18/2017  . Elevated CA-125 01/18/2017  . Depression 12/23/2015  . Peripheral edema 08/23/2015  . Fracture of hip, left, closed ([place]) 12/08/2013  . Hip fracture ([place]) 12/08/2013  . PAD (peripheral artery disease) ([place]) 11/25/2013  . Encounter for therapeutic drug monitoring 11/09/2013  . Atrial fibrillation ([place]) 05/07/2013  . Cough 03/20/2013  . Total knee replacement status 10/07/2012  . Knee pain 10/07/2012  . Knee stiffness 10/07/2012  . Tachycardia 09/17/2012  . Chest pain 09/17/2012  . Difficulty in walking(719.7) 09/16/2012  . Muscle weakness (generalized) 09/16/2012  . Postop Acute blood loss anemia 08/08/2012  . Instability of prosthetic knee ([place]) 08/06/2012  . Bloating 02/14/2012  . Upper abdominal pain 02/14/2012  . Allergic rhinitis, seasonal 02/15/2011  . COLITIS 02/20/2010  . Diarrhea 01/02/2010  . ABDOMINAL PAIN -GENERALIZED 01/02/2010  . PERSONAL HX COLONIC POLYPS 01/02/2010  . HYPOTHYROIDISM 12/28/2009  . COPD (chronic obstructive pulmonary disease) ([place]) 12/28/2009  . ARTHRITIS 12/28/2009       Geraldine Solar PT, DPT  [place] [address] [place], [place], [address] Phone: [phone]    Fax:  [phone]  Name: [name] MRN: [id number] Date of Birth: 1933-04-11

## 2018-02-24 ENCOUNTER — Ambulatory Visit (HOSPITAL_COMMUNITY): Payer: Medicare Other

## 2018-02-24 ENCOUNTER — Encounter (HOSPITAL_COMMUNITY): Payer: Self-pay

## 2018-02-24 DIAGNOSIS — M79662 Pain in left lower leg: Secondary | ICD-10-CM

## 2018-02-24 DIAGNOSIS — R2689 Other abnormalities of gait and mobility: Secondary | ICD-10-CM

## 2018-02-24 DIAGNOSIS — M6281 Muscle weakness (generalized): Secondary | ICD-10-CM | POA: Diagnosis not present

## 2018-02-24 NOTE — Therapy (Signed)
[place], [place], [address] Phone: [phone]   Fax:  [phone]  Physical Therapy Treatment  Patient Details  Name: [name] MRN: [id number] Date of Birth: [birth year] Referring Provider: Dr. Heath Lark   Encounter Date: 02/24/2018  PT End of Session - 02/24/18 1131    Visit Number  24    Number of Visits  30    Date for PT Re-Evaluation  02/28/18    Authorization Type  Medicare    Authorization Time Period  12/23/17 to 02/03/18; NEW: 01/31/18 to 02/28/18    PT Start Time  1122    PT Stop Time  1203    PT Time Calculation (min)  41 min    Equipment Utilized During Treatment  Gait belt    Activity Tolerance  Patient tolerated treatment well;Patient limited by fatigue    Behavior During Therapy  [facility] for tasks assessed/performed       Past Medical History:  Diagnosis Date  . COPD (chronic obstructive pulmonary disease) ([place])   . Depression   . Diverticulosis   . Family hx of colon cancer   . Fibromyalgia   . GERD (gastroesophageal reflux disease)   . Hemorrhoids   . Hiatal hernia   . History of shingles   . Hypothyroidism   . IBS (irritable bowel syndrome)   . Lymphocytic colitis   . Osteoporosis   . Ovarian cancer ([place])   . Paroxysmal atrial fibrillation (H[facility])   . Psoriatic arthritis ([place])   . Rectal polyp   . Schatzki's ring   . Uterine polyp     Past Surgical History:  Procedure Laterality Date  . CHOLECYSTECTOMY  Dec 04, 2010  . COLONOSCOPY    . FEMUR IM NAIL Left 12/11/2013   Procedure: INTRAMEDULLARY (IM) NAIL FEMORAL;  Surgeon: [name], MD;  [facility];  Service: Orthopedics;  Laterality: Left;  . FRACTURE SURGERY Left 12/11/2013   hip  . IR FLUORO GUIDE PORT INSERTION RIGHT  07/22/2017  . IR PARACENTESIS  07/12/2017  . IR US GUIDE VASC ACCESS RIGHT  07/22/2017  . KNEE SURGERY Left    x 2  . TOTAL KNEE REVISION  08/06/2012   Procedure: TOTAL KNEE REVISION;   Surgeon: [name], MD;  Location: [facility];  Service: Orthopedics;  Laterality: Left;  Left Total Knee Arthroplasty Revision  . Uterine polypectomy      There were no vitals filed for this visit.  Subjective Assessment - 02/24/18 1132    Subjective  Pt reports not feeling well at all today. She states that she is hurting all over and just isn't having a good day.     Patient Stated Goals  Would like to come to clinic to build up her strength and endurance    Currently in Pain?  Yes    Pain Score  5     Pain Location  Generalized    Pain Orientation  Right;Left    Pain Descriptors / Indicators  Aching;Dull    Pain Type  Chronic pain    Pain Onset  More than a month ago    Pain Frequency  Intermittent    Aggravating Factors   nothing, walking, standing    Pain Relieving Factors  activity    Effect of Pain on Daily Activities  decreased ability to complete ADLs           [facility] Adult PT Treatment/Exercise - 02/24/18 0001  Knee/Hip Exercises: Standing   Lateral Step Up  Both;15 reps;Hand Hold: 2    Lateral Step Up Limitations  bosu, dome up    Forward Step Up  Both;15 reps    Forward Step Up Limitations  bosu, dome up    Gait Training  gait with SPC x1106f, seated rest break, [facility]    Balance Exercises - 02/24/18 1144      Balance Exercises: Standing   Tandem Stance  Eyes closed;Foam/compliant surface;Intermittent upper extremity support;5 reps;10 secs    Retro Gait  1 rep    Other Standing Exercises  bil tandem stance front foot on bosu and rear foot on airex +palov press with RTB x15 reps each          PT Education - 02/24/18 1132    Education provided  Yes    Education Details  exercise technique, proper breathing, will reassess at end of week    Person(s) Educated  Patient    Methods  Explanation;Demonstration    Comprehension  Verbalized understanding;Returned demonstration       PT Short Term Goals - 01/31/18 1034      PT SHORT TERM  GOAL #1   Title  Patient will ambulated >250 feet with rolling walker during the 3 MWT to improve her ability to ambulate further within her home before needing to rest.    Baseline  4/26: 34043fno rest breaks    Time  3    Period  Weeks    Status  Achieved      PT SHORT TERM GOAL #2   Title  Patient with tolerate the standing position with upper extremity support for 5 minutes before needing to rest to improve her ability to perform standing functional activities and ADLs within her home environment.    Baseline  3/18: able to stand 5 mins with no UE support    Time  3    Period  Weeks    Status  Achieved      PT SHORT TERM GOAL #3   Title  Patient will increase MMT grades by 1/2 grade to show improvement in her strength and stability to perform functional activities.    Baseline  4/26: has improved by at least 1/2 grade throughout since initial evaluation    Time  3    Period  Weeks    Status  Achieved      PT SHORT TERM GOAL #4   Title  Patient will be able to stand on either foot for 10 seconds to exhibit improved balance and decreased risk for falls.    Baseline  4/26: 8 sec on RLE and 6 sec on LLE with no UE    Time  3    Period  Weeks    Status  On-going        PT Long Term Goals - 01/31/18 1035      PT LONG TERM GOAL #1   Title  Patient will ambulated >500 feet with rolling walker during the 3 MWT to improve her ability to ambulate further  in community to allow to her go enjoy going out for a meal.    Baseline  4/26: 340f2fo rest breaks    Time  6    Period  Weeks    Status  On-going      PT LONG TERM GOAL #2   Title  Patient with tolerate the standing position with upper extremity support for 10 minutes  before needing to rest to improve her ability to stand and have a conversation with a friend or relative.    Baseline  4/10: able to stand statically for 10 mins this date with no UE support    Time  6    Period  Weeks    Status  Achieved      PT LONG TERM  GOAL #3   Title  Patient will increase MMT grades by 1 grade to show continued improvement in her strength and stability to perform functional activities at home independently.    Baseline  4/26: see MMT    Time  6    Period  Weeks    Status  Partially Met      PT LONG TERM GOAL #4   Title  Patient will be able to stand on either foot for 15 seconds to exhibit improved balance and further decreased risk for falls.    Baseline  4/26: 8 sec on RLE and 6 sec on LLE with no UE    Time  6    Period  Weeks    Status  On-going      PT LONG TERM GOAL #5   Title  Pt will be able to perform 5xSTS in 12 sec or < with no UE in order to demo improved functional BLE strength and overall balance.    Baseline  4/26: 11.8 sec, no UE    Time  6    Period  Weeks    Status  Achieved      Additional Long Term Goals   Additional Long Term Goals  Yes      PT LONG TERM GOAL #6   Title  Pt will report requiring assistance for ADLs and IADLs at home 25% of the time or < to demonstrate improved overall functional strength, endurance, and balance in order to promote independence at home.    Baseline  4/10: feels that her nurse aide and dtr provide about 25% assistance for ADLs and IADLs    Time  6    Period  Weeks    Status  Achieved      PT LONG TERM GOAL #7   Title  Pt will be able to perform 13 STS during the 30 sec chair rise test in order to demo improved functional BLE strength and further decrease risk for falls.    Time  4    Period  Weeks    Status  New    Target Date  02/28/18      PT LONG TERM GOAL #8   Title  Pt will report being able to stand and ambulate in her kitchen for 20 mins without a rest break to allow her to make a meal with greater ease and maximize function and independence at home.     Time  4    Period  Weeks    Status  New    Target Date  02/28/18            Plan - 02/24/18 1204    Clinical Impression Statement  Continued with established POC focusing on dynamic  balance and functional strength. Pt continues to mainly be limited by deficits in cardiovascular endurance and continues to require seated rest breaks due to SOB; HR remained below 130bpm and recovered with brief short rest break and her O2 stayed >92% and also recovered quickly with small break. Able to progress pt to step ups on BOSU this date. Attempted to  perform retro gait with head turns but pt very unsteady with this as she had 1 LOB which required min A to prevent fall; continued with just retro gait and pt still challenged with this. She reported decreased pain at EOS. Continue as planned, progressing as able.     Rehab Potential  Good    PT Frequency  3x / week    PT Duration  4 weeks    PT Treatment/Interventions  ADLs/Self Care Home Management;Gait training;Stair training;Functional mobility training;Therapeutic activities;Patient/family education;Balance training;Neuromuscular re-education;Therapeutic exercise;Manual techniques;Energy conservation    PT Next Visit Plan  continue gait with SPC for endurance training; continue retro gait; continue to progress balance; stregnthening and endurance activities for hip/glutes strength; Allow adequate rest breaks for patient, monitor HR; continue manual for gluteal restrictions PRN    PT Home Exercise Plan  intial - lap walking at home; supine bridges; 3/18: supine clams with RTB; 3/25: SAQ; 4/5: standing abd, marching, LAQ, knee flexion; 4/15: supine figure 4, supine SKTC; 4/17: sidelying hip abd; 5/1: tandem stance and SLS    Consulted and Agree with Plan of Care  Patient       Patient will benefit from skilled therapeutic intervention in order to improve the following deficits and impairments:  Abnormal gait, Pain, Decreased mobility, Decreased coordination, Decreased activity tolerance, Decreased endurance, Decreased strength, Decreased balance, Difficulty walking  Visit Diagnosis: Muscle weakness (generalized)  Pain in left lower  leg  Other abnormalities of gait and mobility     Problem List Patient Active Problem List   Diagnosis Date Noted  . Thrombocytopenia ([place]) 01/03/2018  . Anemia, chronic disease 12/13/2017  . Mild protein-calorie malnutrition ([place]) 12/13/2017  . Peripheral neuropathy due to chemotherapy ([place]) 12/13/2017  . S/P thoracentesis   . Atrial fibrillation with RVR ([employer]) 11/08/2017  . HCAP (healthcare-associated pneumonia) 11/08/2017  . Acute on chronic diastolic heart failure ([place]) 08/19/2017  . Pancytopenia, acquired ([place]) 08/15/2017  . Protein-calorie malnutrition, moderate ([place]) 07/30/2017  . Generalized weakness 07/30/2017  . Antineoplastic chemotherapy induced pancytopenia ([place]) 07/20/2017  . Ovarian CA, right ([place]) 07/18/2017  . PNA (pneumonia) 07/07/2017  . Ascites 06/30/2017  . Ascites, malignant 06/30/2017  . Right ovarian cyst 01/18/2017  . Elevated CA-125 01/18/2017  . Depression 12/23/2015  . Peripheral edema 08/23/2015  . Fracture of hip, left, closed ([place]) 12/08/2013  . Hip fracture ([place]) 12/08/2013  . PAD (peripheral artery disease) ([place]) 11/25/2013  . Encounter for therapeutic drug monitoring 11/09/2013  . Atrial fibrillation ([place]) 05/07/2013  . Cough 03/20/2013  . Total knee replacement status 10/07/2012  . Knee pain 10/07/2012  . Knee stiffness 10/07/2012  . Tachycardia 09/17/2012  . Chest pain 09/17/2012  . Difficulty in walking(719.7) 09/16/2012  . Muscle weakness (generalized) 09/16/2012  . Postop Acute blood loss anemia 08/08/2012  . Instability of prosthetic knee ([place]) 08/06/2012  . Bloating 02/14/2012  . Upper abdominal pain 02/14/2012  . Allergic rhinitis, seasonal 02/15/2011  . COLITIS 02/20/2010  . Diarrhea 01/02/2010  . ABDOMINAL PAIN -GENERALIZED 01/02/2010  . PERSONAL HX COLONIC POLYPS 01/02/2010  . HYPOTHYROIDISM 12/28/2009  . COPD (chronic obstructive pulmonary disease) ([place]) 12/28/2009  . ARTHRITIS 12/28/2009       Geraldine Solar PT,  DPT  [place] [address] [place], [place], [address] Phone: [phone]   Fax:  [phone]  Name: SHERELLE CASTELLI MRN: [id number] Date of Birth: 1933/08/13

## 2018-02-25 ENCOUNTER — Ambulatory Visit (HOSPITAL_COMMUNITY)
Admission: RE | Admit: 2018-02-25 | Discharge: 2018-02-25 | Disposition: A | Discharge status: Home / Self Care | Payer: Medicare Other | Source: Ambulatory Visit | Attending: Hematology and Oncology | Admitting: Hematology and Oncology

## 2018-02-25 DIAGNOSIS — C786 Secondary malignant neoplasm of retroperitoneum and peritoneum: Secondary | ICD-10-CM | POA: Diagnosis not present

## 2018-02-25 DIAGNOSIS — C561 Malignant neoplasm of right ovary: Secondary | ICD-10-CM | POA: Insufficient documentation

## 2018-02-25 DIAGNOSIS — I700 Atherosclerosis of aorta: Secondary | ICD-10-CM | POA: Diagnosis not present

## 2018-02-25 DIAGNOSIS — J90 Pleural effusion, not elsewhere classified: Secondary | ICD-10-CM | POA: Insufficient documentation

## 2018-02-25 MED ORDER — IOPAMIDOL (ISOVUE-300) INJECTION 61%
100.0000 mL | Freq: Once | INTRAVENOUS | Status: AC | PRN
  Administered 2018-02-25: 100 mL via INTRAVENOUS

## 2018-02-25 MED ORDER — HEPARIN SOD (PORK) LOCK FLUSH 100 UNIT/ML IV SOLN: 500.0000 [IU] | Freq: Once | INTRAVENOUS | Status: DC

## 2018-02-25 MED ORDER — IOPAMIDOL (ISOVUE-300) INJECTION 61%
INTRAVENOUS | Status: AC
  Filled 2018-02-25: qty 100

## 2018-02-26 ENCOUNTER — Ambulatory Visit (HOSPITAL_COMMUNITY): Payer: Medicare Other

## 2018-02-26 ENCOUNTER — Telehealth (HOSPITAL_COMMUNITY): Payer: Self-pay

## 2018-02-26 DIAGNOSIS — M79662 Pain in left lower leg: Secondary | ICD-10-CM

## 2018-02-26 DIAGNOSIS — M6281 Muscle weakness (generalized): Secondary | ICD-10-CM | POA: Diagnosis not present

## 2018-02-26 DIAGNOSIS — R2689 Other abnormalities of gait and mobility: Secondary | ICD-10-CM

## 2018-02-26 NOTE — Therapy (Signed)
[place], [place], [address] Phone: [phone]   Fax:  [phone]  Physical Therapy Treatment  Patient Details  Name: [name] MRN: [id number] Date of Birth: 08-02-1933 Referring Provider: Dr. Heath Lark   Encounter Date: 02/26/2018  PT End of Session - 02/26/18 1126    Visit Number  25    Number of Visits  30    Date for PT Re-Evaluation  02/28/18    Authorization Type  Medicare    Authorization Time Period  12/23/17 to 02/03/18; NEW: 01/31/18 to 02/28/18    PT Start Time  1118    PT Stop Time  1201    PT Time Calculation (min)  43 min    Equipment Utilized During Treatment  Gait belt    Activity Tolerance  Patient tolerated treatment well;Patient limited by fatigue    Behavior During Therapy  [facility] for tasks assessed/performed       Past Medical History:  Diagnosis Date  . COPD (chronic obstructive pulmonary disease) ([place])   . Depression   . Diverticulosis   . Family hx of colon cancer   . Fibromyalgia   . GERD (gastroesophageal reflux disease)   . Hemorrhoids   . Hiatal hernia   . History of shingles   . Hypothyroidism   . IBS (irritable bowel syndrome)   . Lymphocytic colitis   . Osteoporosis   . Ovarian cancer ([place])   . Paroxysmal atrial fibrillation ([facility])   . Psoriatic arthritis ([place])   . Rectal polyp   . Schatzki's ring   . Uterine polyp     Past Surgical History:  Procedure Laterality Date  . CHOLECYSTECTOMY  Dec 04, 2010  . COLONOSCOPY    . FEMUR IM NAIL Left 12/11/2013   Procedure: INTRAMEDULLARY (IM) NAIL FEMORAL;  Surgeon: [name], MD;  Location: [facility];  Service: Orthopedics;  Laterality: Left;  . FRACTURE SURGERY Left 12/11/2013   hip  . IR FLUORO GUIDE PORT INSERTION RIGHT  07/22/2017  . IR PARACENTESIS  07/12/2017  . IR US GUIDE VASC ACCESS RIGHT  07/22/2017  . KNEE SURGERY Left    x 2  . TOTAL KNEE REVISION  08/06/2012   Procedure: TOTAL KNEE REVISION;   Surgeon: [name], MD;  Location: [facility];  Service: Orthopedics;  Laterality: Left;  Left Total Knee Arthroplasty Revision  . Uterine polypectomy      There were no vitals filed for this visit.  Subjective Assessment - 02/26/18 1127    Subjective  Pt states that she hardly slept at all Monday night due to her pain. She doesn't think her pain was increased due to therapy. She states that she is feeling much better today.    Patient Stated Goals  Would like to come to clinic to build up her strength and endurance    Currently in Pain?  Yes    Pain Score  3     Pain Location  Generalized    Pain Onset  More than a month ago    Pain Frequency  Intermittent    Aggravating Factors   nothing, walking, standing    Pain Relieving Factors  activity    Effect of Pain on Daily Activities  decreased ability to complete ADLs              [facility] Adult PT Treatment/Exercise - 02/26/18 0001      Knee/Hip Exercises: Standing   Lateral Step Up  Both;15 reps;Hand Hold: 2    Lateral Step Up Limitations  bosu, dome up    Forward Step Up  Both;15 reps;Hand Hold: 2    Forward Step Up Limitations  bosu, dome up    Gait Training  gait with SPC x272f, seated rest break, then [id number]    Balance Exercises - 02/26/18 1144      Balance Exercises: Standing   Rockerboard  Lateral;Anterior/posterior;EC;Intermittent UE support x2 mins each    Retro Gait  1 rep intermittent UE support           PT Education - 02/26/18 1127    Education provided  Yes    Education Details  exercise technique, proper breathing, will reassess next visit    Person(s) Educated  Patient    Methods  Explanation;Demonstration    Comprehension  Verbalized understanding;Returned demonstration       PT Short Term Goals - 01/31/18 1034      PT SHORT TERM GOAL #1   Title  Patient will ambulated >250 feet with rolling walker during the 3 MWT to improve her ability to ambulate further within her home before  needing to rest.    Baseline  4/26: [id number] rest breaks    Time  3    Period  Weeks    Status  Achieved      PT SHORT TERM GOAL #2   Title  Patient with tolerate the standing position with upper extremity support for 5 minutes before needing to rest to improve her ability to perform standing functional activities and ADLs within her home environment.    Baseline  3/18: able to stand 5 mins with no UE support    Time  3    Period  Weeks    Status  Achieved      PT SHORT TERM GOAL #3   Title  Patient will increase MMT grades by 1/2 grade to show improvement in her strength and stability to perform functional activities.    Baseline  4/26: has improved by at least 1/2 grade throughout since initial evaluation    Time  3    Period  Weeks    Status  Achieved      PT SHORT TERM GOAL #4   Title  Patient will be able to stand on either foot for 10 seconds to exhibit improved balance and decreased risk for falls.    Baseline  4/26: 8 sec on RLE and 6 sec on LLE with no UE    Time  3    Period  Weeks    Status  On-going        PT Long Term Goals - 01/31/18 1035      PT LONG TERM GOAL #1   Title  Patient will ambulated >500 feet with rolling walker during the 3 MWT to improve her ability to ambulate further  in community to allow to her go enjoy going out for a meal.    Baseline  4/26: [id number] rest breaks    Time  6    Period  Weeks    Status  On-going      PT LONG TERM GOAL #2   Title  Patient with tolerate the standing position with upper extremity support for 10 minutes before needing to rest to improve her ability to stand and have a conversation with a friend or relative.    Baseline  4/10: able to stand statically for 10 mins this date  with no UE support    Time  6    Period  Weeks    Status  Achieved      PT LONG TERM GOAL #3   Title  Patient will increase MMT grades by 1 grade to show continued improvement in her strength and stability to perform functional activities  at home independently.    Baseline  4/26: see MMT    Time  6    Period  Weeks    Status  Partially Met      PT LONG TERM GOAL #4   Title  Patient will be able to stand on either foot for 15 seconds to exhibit improved balance and further decreased risk for falls.    Baseline  4/26: 8 sec on RLE and 6 sec on LLE with no UE    Time  6    Period  Weeks    Status  On-going      PT LONG TERM GOAL #5   Title  Pt will be able to perform 5xSTS in 12 sec or < with no UE in order to demo improved functional BLE strength and overall balance.    Baseline  4/26: 11.8 sec, no UE    Time  6    Period  Weeks    Status  Achieved      Additional Long Term Goals   Additional Long Term Goals  Yes      PT LONG TERM GOAL #6   Title  Pt will report requiring assistance for ADLs and IADLs at home 25% of the time or < to demonstrate improved overall functional strength, endurance, and balance in order to promote independence at home.    Baseline  4/10: feels that her nurse aide and dtr provide about 25% assistance for ADLs and IADLs    Time  6    Period  Weeks    Status  Achieved      PT LONG TERM GOAL #7   Title  Pt will be able to perform 13 STS during the 30 sec chair rise test in order to demo improved functional BLE strength and further decrease risk for falls.    Time  4    Period  Weeks    Status  New    Target Date  02/28/18      PT LONG TERM GOAL #8   Title  Pt will report being able to stand and ambulate in her kitchen for 20 mins without a rest break to allow her to make a meal with greater ease and maximize function and independence at home.     Time  4    Period  Weeks    Status  New    Target Date  02/28/18            Plan - 02/26/18 1206    Clinical Impression Statement  Pt able to ambulate 217f straight this date before requiring rest break. Continued with established POC focusing on dynamic balance activities. Pt with difficulty with lateral rockerboard with EC and retro  gait this date. Pt continues to be limited by SOB, not BLE fatigue. PT feels as far as mobility and functional strength, pt is WNL and will likely by d/c at her next visit. PT did educate pt on cardiopulm rehab and its potential benefits to her and educated her to contact her cardiologist or pulmonologist regarding pursuing this referral. Pt due for reassessment and likely d/c next visit due to  progress made.    Rehab Potential  Good    PT Frequency  3x / week    PT Duration  4 weeks    PT Treatment/Interventions  ADLs/Self Care Home Management;Gait training;Stair training;Functional mobility training;Therapeutic activities;Patient/family education;Balance training;Neuromuscular re-education;Therapeutic exercise;Manual techniques;Energy conservation    PT Next Visit Plan  reassess; continue gait with SPC for endurance training; continue retro gait; continue to progress balance; stregnthening and endurance activities for hip/glutes strength; Allow adequate rest breaks for patient, monitor HR; continue manual for gluteal restrictions PRN    PT Home Exercise Plan  intial - lap walking at home; supine bridges; 3/18: supine clams with RTB; 3/25: SAQ; 4/5: standing abd, marching, LAQ, knee flexion; 4/15: supine figure 4, supine SKTC; 4/17: sidelying hip abd; 5/1: tandem stance and SLS    Consulted and Agree with Plan of Care  Patient       Patient will benefit from skilled therapeutic intervention in order to improve the following deficits and impairments:  Abnormal gait, Pain, Decreased mobility, Decreased coordination, Decreased activity tolerance, Decreased endurance, Decreased strength, Decreased balance, Difficulty walking  Visit Diagnosis: Muscle weakness (generalized)  Pain in left lower leg  Other abnormalities of gait and mobility     Problem List Patient Active Problem List   Diagnosis Date Noted  . Thrombocytopenia ([place]) 01/03/2018  . Anemia, chronic disease 12/13/2017  . Mild  protein-calorie malnutrition ([place]) 12/13/2017  . Peripheral neuropathy due to chemotherapy ([place]) 12/13/2017  . S/P thoracentesis   . Atrial fibrillation with RVR ([place]) 11/08/2017  . HCAP (healthcare-associated pneumonia) 11/08/2017  . Acute on chronic diastolic heart failure ([place]) 08/19/2017  . Pancytopenia, acquired ([place]) 08/15/2017  . Protein-calorie malnutrition, moderate ([place]) 07/30/2017  . Generalized weakness 07/30/2017  . Antineoplastic chemotherapy induced pancytopenia ([place]) 07/20/2017  . Ovarian CA, right ([place]) 07/18/2017  . PNA (pneumonia) 07/07/2017  . Ascites 06/30/2017  . Ascites, malignant 06/30/2017  . Right ovarian cyst 01/18/2017  . Elevated CA-125 01/18/2017  . Depression 12/23/2015  . Peripheral edema 08/23/2015  . Fracture of hip, left, closed ([place]) 12/08/2013  . Hip fracture ([place]) 12/08/2013  . PAD (peripheral artery disease) ([place]) 11/25/2013  . Encounter for therapeutic drug monitoring 11/09/2013  . Atrial fibrillation ([place]) 05/07/2013  . Cough 03/20/2013  . Total knee replacement status 10/07/2012  . Knee pain 10/07/2012  . Knee stiffness 10/07/2012  . Tachycardia 09/17/2012  . Chest pain 09/17/2012  . Difficulty in walking(719.7) 09/16/2012  . Muscle weakness (generalized) 09/16/2012  . Postop Acute blood loss anemia 08/08/2012  . Instability of prosthetic knee ([place]) 08/06/2012  . Bloating 02/14/2012  . Upper abdominal pain 02/14/2012  . Allergic rhinitis, seasonal 02/15/2011  . COLITIS 02/20/2010  . Diarrhea 01/02/2010  . ABDOMINAL PAIN -GENERALIZED 01/02/2010  . PERSONAL HX COLONIC POLYPS 01/02/2010  . HYPOTHYROIDISM 12/28/2009  . COPD (chronic obstructive pulmonary disease) ([place]) 12/28/2009  . ARTHRITIS 12/28/2009       Geraldine Solar PT, DPT  [place] [address] [place], [place], [address] Phone: [phone]   Fax:  [phone]  Name: SIAN ROCKERS MRN: [id number] Date of Birth:  07/01/1933

## 2018-02-26 NOTE — Telephone Encounter (Signed)
Pt l/m to cx this apptment - no reason given.

## 2018-02-27 ENCOUNTER — Encounter: Payer: Self-pay | Admitting: Obstetrics

## 2018-02-27 ENCOUNTER — Inpatient Hospital Stay: Payer: Medicare Other | Attending: Hematology and Oncology | Admitting: Obstetrics

## 2018-02-27 VITALS — BP 144/58 | HR 102 | Temp 103.0°F | Resp 18 | Ht 64.0 in | Wt 164.1 lb

## 2018-02-27 DIAGNOSIS — C569 Malignant neoplasm of unspecified ovary: Secondary | ICD-10-CM | POA: Diagnosis present

## 2018-02-27 DIAGNOSIS — Z9221 Personal history of antineoplastic chemotherapy: Secondary | ICD-10-CM | POA: Diagnosis not present

## 2018-02-27 DIAGNOSIS — C786 Secondary malignant neoplasm of retroperitoneum and peritoneum: Secondary | ICD-10-CM | POA: Diagnosis not present

## 2018-02-27 NOTE — Progress Notes (Signed)
Progress Note: Gyn-Onc   CC:  Chief Complaint  Patient presents with  . Malignant neoplasm of ovary, unspecified laterality [facility])    HPI: [name]  is a very nice 84 y.o. P1  . Interval History:  Since her last visit with Dr. Skeet Latch 12/05/17 she was having a difficult time with double-agent chemotherapy; experiencing debilitated state and significant arthralgias. She was changed to single agent Carboplatin receiving the first cycle of that drug 12/13/17 (about 5.5 weeks from her last Carbo/Tax dosing). She has been receiving single agent chemotherapy since 12/13/17. She received Cycle 3 on 02/04/18.  She is being sent today for reconsideration of surgery. Dr. Skeet Latch doesn't have surgery time here until August 2019 so I am being asked to see her so that if surgery is recommended she can undergo that at [facility] in [place].  She is doing fairly well now and is out of the wheelchair, off the oxygen, and lives at home.  She presents with her daughter today.  With regard to her response to therapy there appears to be stabilization of the disease. Her CA125 is hovering in the mid 100 range. Her imaging 02/25/18 agrees with probable stable disease since prior on 11/28/17. The left adnexal mass seen on the 06/2017 scan when she was diagnosed is now by my measurements 1.8x3.5cm; previously 3.5 x 2.8cm. The omental cake, compared to 06/2017 seems improved.   I am concerned about some of the findings: 1. Imaging suggesting the right sided pleural effusion is worsening; while on treatment 2. The near plateauing of the CA125 3. 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 Initial Presentation: She was initially seen in consultation 01/2017 at the request of Dr Lynnette Caffey for a 4cm right ovarian cyst and elevated CA 125.  The cyst was first detected on imaging (CT) in 2012 (6 years prior to initial referral) and was  approximately 4cm. It was again seen on a CT scan in August, 2017 and was stable in dimension, and described by radiology as likely being benign.   Her CA 125 in May 2017 was 45. It was 69 in August, 2017.   She was scheduled for a repeat CA 125 in March, 13, 2018 this was elevated to 133.5.  It was at this time she was referred to [facility].  The patient denied abdominal bloating or early satiety and she had no vaginal bleeding.  Her major complaint was back pain which has been very bad in the past 6 months and she has been regularly on steroid tapers (most recently completed 2 weeks ago). She has received multiple joint injections for this also.  She takes Xarelto for atrial fibrillation. She has ulcerative colitis. Her bouts have been less prominent lately, likely secondary to prednisone use for her back.  CT scan on 02/07/17 was ordered due to the rising CA 125. It showed a normal appearance of uterus. A simple appearing cystic lesion is again seen in the right adnexa which measures 3.8 x 3.7cm. This remains stable since 2012, consistent with benign etiology. No other masses or inflammatory process identified.  Given her serious medical comorbidities, including COPD, surgery was not performed for what appeared to be consistent with a benign lesion as the risk were felt to outweigh the benefits.  In August, 2018 she began experiencing increasing abdominal distension and discomfort.  On 07/07/17 she was admitted to [facility] for  abdominal distension and SOB. CT imaging showed interim finding of moderate ascites within the abdomen and pelvis with additional finding of diffuse nodular infiltration of the omentum and anterior mesenteric fat, the appearance would be consistent with peritoneal carcinomatosis/metastatic disease. Increasing retroperitoneal and upper abdominal adenopathy. Re- demonstrated 3.8 cm cyst in the right adnexa. Enlarging soft tissue density in the left adnexa  now with possible cystic component posteriorly. In light of the above findings, concern is for ovarian neoplasm. Correlation with pelvic ultrasound recommended. Small right-sided pleural effusion, new since prior study Stable hypodense splenic lesions since 2017.  US guided paracentesis was performed on 07/01/17 which showed adenocarcinoma, favor gynecologic primary. She had multiple paracenteses because her ascites continued to accumulate and caused respiratory distress. In addition she had pleural effusions but these were considered too small for thoracentesis.   She initiated neoadjuvant chemotherapy due to her poor performance status. She underwent chemotherapy with Carbo/Taxol x 6 cycles 07/19/17-11/04/17. She saw Dr. Fermin Schwab after Cycle 3 and due to her poor performance status and continued oxygen requirement was felt not to be a surgical candidate at that time.   She then had PNA and was sent to rehab for a bit. She followed up with Dr. Skeet Latch 12/05/17 with initial thought for reconsideration of interval debulking, but due to the setback from the PNA and continued poor pulmonary function it was recommended against surgery at that time.     Measurement of disease:  Recent Labs    07/18/17 1421 08/09/17 0948 08/15/17 0935 09/20/17 1011 10/14/17 0838 11/04/17 0853 12/13/17 1232 01/03/18 1305 01/14/18 1343 02/04/18 1238  CAN125 1,941.0* 1,665.0* 937.9* 347.0* 307.4* 262.5* 197.7* 183.1* 177.4* 168.5*    Carbo/Taxol C1 07/19/2017 --> C6 11/04/2017 changed to C1 single agent carbo 12/13/2017 --> C3 single agent carbo 02/04/2018   Radiology: . Multiple CT scans in the system reviewed by me personally    Oncologic History:      Ovarian CA, right ([place])   02/18/2016 Tumor Marker    Patient's tumor was tested for the following markers: CA125 Results of the tumor marker test revealed 45      05/22/2016 Tumor Marker    Patient's tumor was tested for the following markers:  CA125 Results of the tumor marker test revealed 53      05/22/2016 Imaging    Outside pelvic US showed 4.1 cm adnexa mass      06/24/2017 Imaging    Ct abdomen and pelvis:  1. Interim finding of moderate ascites within the abdomen and pelvis with additional finding of diffuse nodular infiltration of the omentum and anterior mesenteric fat, the appearance would be consistent with peritoneal carcinomatosis/metastatic disease. Increasing retroperitoneal and upper abdominal adenopathy. 2. Re- demonstrated 3.8 cm cyst in the right adnexa. Enlarging soft tissue density in the left adnexa now with possible cystic component posteriorly. In light of the above findings, concern is for ovarian neoplasm. Correlation with pelvic ultrasound recommended. 3. Small right-sided pleural effusion, new since prior study 4. Stable hypodense splenic lesions since 2017.       06/25/2017 Imaging    US pelvis: 2.9 cm simple appearing cyst in the right ovary. Left ovary grossly unremarkable. Large volume ascites in the pelvis      06/30/2017 - 07/01/2017 Hospital Admission    She was admitted for evaluation of abdominal pain and ascites      07/01/2017 Pathology Results    PERITONEAL/ASCITIC FLUID(SPECIMEN 1 OF 1 COLLECTED 07/01/17): - POORLY DIFFERENTIATED CARCINOMA; SEE COMMENT Source  Peritoneal/Ascitic Fluid, (specimen 1 of 1 collected 07/01/17) Gross Specimen: Received is/are 1000 cc's of brownish fluid. (BS:bs) Prepared: # Smears: 0 # Concentration Technique Slides (i.e. ThinPrep): 1 # Cell Block: 1 Additional Studies: Also received Hematology slide - [id number]. Comment The tumor cells are positive for cytokeratin 7 and Pax-8 but negative for cytokeratin 20, CDX-2, GATA-3, Napsin-A and TTF-1. Based on the immunoprofile a gynecology primary is favored      07/01/2017 Procedure    Successful ultrasound-guided diagnostic and therapeutic paracentesis yielding 2.5 liters of peritoneal fluid      07/07/2017 -  07/09/2017 Hospital Admission    She was admitted for management of malignant ascites      07/08/2017 Procedure    Successful ultrasound-guided therapeutic paracentesis yielding 2.7 liters liters of peritoneal fluid      07/12/2017 Procedure    Successful ultrasound-guided paracentesis yielding 1450 mL of peritoneal fluid      07/18/2017 - 07/24/2017 Hospital Admission    She was admitted for expedited treatment      07/18/2017 Tumor Marker    Patient's tumor was tested for the following markers: CA125 Results of the tumor marker test revealed 1941      07/19/2017 - 11/04/2017 Chemotherapy    The patient had 6 cycles of carboplatin & Taxol for chemotherapy treatment.        08/06/2017 Procedure    Successful ultrasound-guided therapeutic paracentesis yielding 2.6 liters of peritoneal fluid.      08/09/2017 Tumor Marker    Patient's tumor was tested for the following markers: CA125 Results of the tumor marker test revealed 1665      08/15/2017 Tumor Marker    Patient's tumor was tested for the following markers: CA125 Results of the tumor marker test revealed 937.9      08/20/2017 Imaging    ECHO: Normal LV size with EF 60-65%. Normal RV size and systolic function. No significant valvular abnormalities.      09/18/2017 Imaging    Chest Impression:  1. No evidence thoracic metastasis. 2. Interval increase and RIGHT pleural effusion.  Abdomen / Pelvis Impression:  1. Interval decrease in intraperitoneal free fluid. 2. Interval decrease in omental nodularity in the LEFT ventral peritoneal space. 3. Interval decrease in nodularity associated with the LEFT ovary. 4. Cystic portion of the RIGHT ovary is increased mildly in size.      09/20/2017 Tumor Marker    Patient's tumor was tested for the following markers: CA125 Results of the tumor marker test revealed 347      10/14/2017 Tumor Marker    Patient's tumor was tested for the following markers: CA125 Results of  the tumor marker test revealed 307.4      11/04/2017 Tumor Marker    Patient's tumor was tested for the following markers: CA125 Results of the tumor marker test revealed 262.5      11/28/2017 Imaging    1. Interval decrease in right pleural effusion with resolution of right atelectasis seen previously. 2. New small left pleural effusion, symmetric to the right. 3. No intraperitoneal free fluid on the current study. 4. Continued further decrease in left omental disease, appearing less confluent today than on the prior study. 5. Left ovary remains normal in appearance today and the right adnexal cystic lesion is decreased in size compared to prior study. 6. 14 mm pancreatic cyst is unchanged. Continued attention on follow-up imaging recommended. 7. Aortic Atherosclerois (ICD10-170.0)      12/13/2017 Tumor Marker    Patient's tumor  was tested for the following markers: CA125 Results of the tumor marker test revealed 197.7      01/03/2018 Tumor Marker    Patient's tumor was tested for the following markers: CA125 Results of the tumor marker test revealed 183.1      01/14/2018 Tumor Marker    Patient's tumor was tested for the following markers: CA125 Results of the tumor marker test revealed 177.4      02/04/2018 Tumor Marker    Patient's tumor was tested for the following markers: CA125 Results of the tumor marker test revealed 168.5      02/25/2018 Imaging    1. Omental carcinomatosis appears qualitatively stable to slightly decreased. Stable mild peritoneal thickening in the paracolic gutters. Stable right adnexal cyst. No ascites. No new or progressive metastatic disease in the abdomen or pelvis. 2. Small dependent right pleural effusion is increased. 3. Cystic pancreatic body lesion is decreased and now subcentimeter in size, suggesting a benign lesion. 4. Aortic Atherosclerosis (ICD10-I70.0).      Current Meds:  Outpatient Encounter Medications as of 02/27/2018  Medication  Sig  . albuterol (PROAIR HFA) 108 (90 Base) MCG/ACT inhaler Inhale 2 puffs into the lungs every 6 (six) hours as needed for wheezing or shortness of breath.  . ALPRAZolam (XANAX) 0.25 MG tablet Take 1 tablet (0.25 mg total) by mouth at bedtime as needed for anxiety.  Marland Kitchen azelastine (ASTELIN) 0.1 % nasal spray Place 2 sprays into both nostrils 2 (two) times daily as needed for allergies.   . Cholecalciferol (VITAMIN D) 2000 units CAPS Take 2,000 Units by mouth daily.  Marland Kitchen diltiazem (CARDIZEM CD) 120 MG 24 hr capsule Take 1 capsule (120 mg total) by mouth every evening.  . diltiazem (CARDIZEM CD) 180 MG 24 hr capsule Take 1 capsule (180 mg total) by mouth every morning.  . diphenhydrAMINE (BENADRYL) 25 mg capsule Take 25 mg by mouth every 6 (six) hours as needed for itching.  . diphenoxylate-atropine (LOMOTIL) 2.5-0.025 MG tablet Take 1 tablet by mouth 4 (four) times daily. (Patient taking differently: Take 1 tablet by mouth 4 (four) times daily as needed for diarrhea or loose stools. )  . DULoxetine (CYMBALTA) 30 MG capsule TAKE 1 CAPSULE(30 MG) BY MOUTH DAILY  . fluticasone (FLONASE) 50 MCG/ACT nasal spray Place 2 sprays into both nostrils 2 (two) times daily. For nasal congestion  . furosemide (LASIX) 20 MG tablet Take 1 tablet (20 mg total) by mouth daily.  Marland Kitchen HYDROcodone-acetaminophen (NORCO) 10-325 MG tablet Take 1 tablet by mouth every 4 (four) hours as needed.  Marland Kitchen ipratropium-albuterol (DUONEB) 0.5-2.5 (3) MG/3ML SOLN Take 3 mLs by nebulization 3 (three) times daily. (Patient taking differently: Take 3 mLs by nebulization every 6 (six) hours as needed (cough or SOB). As needed)  . levothyroxine (SYNTHROID, LEVOTHROID) 137 MCG tablet Take 137 mcg by mouth every morning. For thyroid therapy  . lidocaine-prilocaine (EMLA) cream Apply 1 application topically as needed.  . loperamide (IMODIUM) 2 MG capsule Take 2 capsules (4 mg total) by mouth 3 (three) times daily.  . mirtazapine (REMERON) 15 MG tablet  Take 1 tablet (15 mg total) by mouth at bedtime.  Marland Kitchen morphine (MS CONTIN) 15 MG 12 hr tablet Take 1 tablet (15 mg total) by mouth every 12 (twelve) hours.  . polyvinyl alcohol (LIQUIFILM TEARS) 1.4 % ophthalmic solution 1 drop 2 (two) times daily as needed for dry eyes.  . potassium chloride (K-DUR) 10 MEQ tablet Take 10 mEq by mouth daily.  Marland Kitchen  Tiotropium Bromide-Olodaterol (STIOLTO RESPIMAT) 2.5-2.5 MCG/ACT AERS Inhale 2 puffs into the lungs daily.  Alveda Reasons 20 MG TABS tablet TAKE 1 TABLET BY MOUTH EVERY DAY WITH SUPPER  . [DISCONTINUED] Calcium Carbonate (CALCIUM 600) 1500 MG TABS Take 2 tablets by mouth daily.     No facility-administered encounter medications on file as of 02/27/2018.     Allergy:  Allergies  Allergen Reactions  . Codeine Itching    Social Hx:   Tobacco Use: Former smoker with 20-pack-year quit in 1982 Alcohol Use: Rare Illicit Drug Use: None   Past Surgical Hx:  Past Surgical History:  Procedure Laterality Date  . COLONOSCOPY    . FEMUR IM NAIL Left 12/11/2013   Procedure: INTRAMEDULLARY (IM) NAIL FEMORAL;  Surgeon: [name], MD;  Location: [facility];  Service: Orthopedics;  Laterality: Left;  . FRACTURE SURGERY Left 12/11/2013   hip  . IR FLUORO GUIDE PORT INSERTION RIGHT  07/22/2017  . IR PARACENTESIS  07/12/2017  . IR US GUIDE VASC ACCESS RIGHT  07/22/2017  . KNEE SURGERY Left    x 2  . LAPAROSCOPIC CHOLECYSTECTOMY  Dec 04, 2010  . TOTAL KNEE REVISION  08/06/2012   Procedure: TOTAL KNEE REVISION;  Surgeon: [name], MD;  Location: [facility];  Service: Orthopedics;  Laterality: Left;  Left Total Knee Arthroplasty Revision  . Uterine polypectomy      Past Medical Hx:  Past Medical History:  Diagnosis Date  . COPD (chronic obstructive pulmonary disease) ([place])   . Depression   . Diverticulosis   . Family hx of colon cancer   . Fibromyalgia   . GERD (gastroesophageal reflux disease)   . Hemorrhoids   . Hiatal hernia   . History of rectal  polyps   . History of shingles   . History of uterine polyp   . Hypothyroidism   . IBS (irritable bowel syndrome)   . Lymphocytic colitis   . Osteoporosis   . Ovarian cancer ([place])   . Paroxysmal atrial fibrillation ([facility])   . Psoriatic arthritis ([place])   . Schatzki's ring     Past Gynecological History:   GYNECOLOGIC HISTORY:  No LMP recorded. Patient is postmenopausal. P 1 LMP 40s Contraceptive none HRT none    Family Hx:  Family History  Problem Relation Age of Onset  . Heart disease Father        pacemaker  . Lung cancer Father        hx of smoker  . Colon cancer Mother        dx in her mid 62's  . Colon cancer Maternal Grandfather   . Depression Sister     Review of Systems:  Review of Systems  HENT:   Positive for hearing loss.   Gastrointestinal: Positive for diarrhea.  Musculoskeletal: Positive for arthralgias and back pain.  Neurological: Positive for dizziness and numbness.  All other systems reviewed and are negative.   Vitals:  Blood pressure (!) 144/58, pulse (!) 102, temperature (!) 103 F (39.4 C), temperature source Oral, resp. rate 18, height 5\' 4"  (1.626 m), weight 164 lb 1.6 oz (74.4 kg), SpO2 100 %. Body mass index is 28.17 kg/m.  Physical Exam: ECOG PERFORMANCE STATUS: 1 - Symptomatic but completely ambulatory   General :  Well developed, 84 y.o., female in no apparent distress HEENT:  Normocephalic/atraumatic, symmetric, EOMI, eyelids normal Neck:   Supple, no masses.  Lymphatics:  No cervical/ submandibular/ supraclavicular/ infraclavicular/ inguinal adenopathy Respiratory:  Respirations  unlabored, no use of accessory muscles CV:   Deferred Breast:  Deferred Musculoskeletal: No CVA tenderness, normal muscle strength. Abdomen:  Soft, non-tender and nondistended. No evidence of hernia. No masses. Extremities:  No lymphedema, no erythema, non-tender. Skin:   Normal inspection Neuro/Psych:  No focal motor deficit, no abnormal mental  status. Normal gait. Normal affect. Alert and oriented to person, place, and time  Genito Urinary: deferred today as patient visit extended and daughter emotional during visit.  Last Genito Urinary by Dr. Fermin Schwab 10/2017 Vulva/vagina: Normal external female genitalia.  No lesions. No discharge or bleeding.             Bladder/urethra:  No lesions or masses, well supported bladder             Vagina: normal             Cervix: Normal appearing, no lesions.             Uterus:  Small, mobile, no parametrial involvement or nodularity.             Adnexa: no palpable masses.   Oncologic Summary: 1. 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  Prolonged chemotherapy treatment due to poor performance status see details above    Assessment/Plan: 1. 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 2. I recommended a PET scan to follow-up on any disease outside of the pelvis and better counsel her on the probable surgical procedure o It is already apparent there is omental disease.  The response of disease in this area has been by imaging less than I would hope after so  many cycles of platinum. o We will try to reach out to radiology to see if they agree and what I am seeing is thickening on the diaphragm looking back it imaging from 2012 3. If the PET and radiology feel the diaphragm does not have bulky disease I think surgery reasonable, although even in this scenario I am not sure it will be contributing to her prognosis being improved. 4. She should have genetic testing to help make plans should she become platinum refractory or should we proceed to resection for the maintenance setting.  Isabel Caprice, MD  03/03/2018, 11:02 AM   Cc: Asencion Noble, MD (PCP)

## 2018-02-27 NOTE — Patient Instructions (Signed)
Plan to have a PET scan then follow up in the office with Dr. Gerarda Fraction.  We will also reach out to radiology to review the CT images of the diaphragm.  Please call for any needs or concerns in between that time.

## 2018-02-28 ENCOUNTER — Ambulatory Visit (INDEPENDENT_AMBULATORY_CARE_PROVIDER_SITE_OTHER): Payer: Medicare Other | Admitting: Adult Health

## 2018-02-28 ENCOUNTER — Telehealth: Payer: Self-pay | Admitting: Adult Health

## 2018-02-28 ENCOUNTER — Encounter: Payer: Self-pay | Admitting: Adult Health

## 2018-02-28 ENCOUNTER — Ambulatory Visit (HOSPITAL_COMMUNITY): Payer: Medicare Other

## 2018-02-28 VITALS — BP 148/62 | HR 115 | Ht 65.0 in | Wt 163.2 lb

## 2018-02-28 DIAGNOSIS — J90 Pleural effusion, not elsewhere classified: Secondary | ICD-10-CM | POA: Diagnosis not present

## 2018-02-28 DIAGNOSIS — J42 Unspecified chronic bronchitis: Secondary | ICD-10-CM | POA: Diagnosis not present

## 2018-02-28 NOTE — Progress Notes (Signed)
@Patient  ID: [name], female    DOB: Jul 19, 1933, 84 y.o.   MRN: [id number]  Chief Complaint  Patient presents with  . Follow-up    Referring provider: Asencion Noble, MD  HPI: 84 year old female former smoker followed for COPD, chronic rhinitis and chronic cough.  Past medical history significant for ovarian cancer undergoing active treatment Psoriatic arthritis (previously on methotrexate) A. fib and diastolic heart failure on Xarelto  02/28/2018 Acute OV : Dyspnea  Presents for an acute office visit.  Patient says that she has been having more shortness of breath.  She has minimum cough.  But gets significant dyspnea with minimal activity and has associated tachycardia.  No discolored mucus fever or hemoptysis.  She recently had a CT abdomen and pelvis to follow her ovarian cancer.  And was noted to have reaccumulation of small right pleural effusion.  She is currently following with oncology and GYN oncology for ovarian cancer, she has been very debilitated from chemotherapy.  She has an upcoming PET scan next week.  She has been undergoing physical therapy to help with severe deconditioning She remains on Stiolto. Previously had right pleural effusion in February underwent thoracentesis with one-point liters pulled off.  Cytology showed atypical cells. Patient has had previous paracentesis in the past with positive malignant cells noted..  She is on Xarelto daily for A. Fib.    Allergies  Allergen Reactions  . Codeine Itching    Immunization History  Administered Date(s) Administered  . Influenza Split 07/09/2011, 08/19/2013, 06/26/2014  . Influenza Whole 07/08/2009, 07/08/2010, 07/10/2012  . Influenza, High Dose Seasonal PF 07/09/2017  . Influenza,inj,Quad PF,6+ Mos 07/09/2015, 07/07/2016  . Pneumococcal Conjugate-13 07/26/2014  . Pneumococcal Polysaccharide-23 07/08/2008    Past Medical History:  Diagnosis Date  . COPD (chronic obstructive pulmonary disease)  ([facility])   . Depression   . Diverticulosis   . Family hx of colon cancer   . Fibromyalgia   . GERD (gastroesophageal reflux disease)   . Hemorrhoids   . Hiatal hernia   . History of shingles   . Hypothyroidism   . IBS (irritable bowel syndrome)   . Lymphocytic colitis   . Osteoporosis   . Ovarian cancer ([place])   . Paroxysmal atrial fibrillation ([facility])   . Psoriatic arthritis ([place])   . Rectal polyp   . Schatzki's ring   . Uterine polyp     Tobacco History: Social History   Tobacco Use  Smoking Status Former Smoker  . Packs/day: 1.00  . Years: 20.00  . Pack years: 20.00  . Types: Cigarettes  . Last attempt to quit: 10/08/1980  . Years since quitting: 37.4  Smokeless Tobacco Never Used   Counseling given: Not Answered   Outpatient Encounter Medications as of 02/28/2018  Medication Sig  . albuterol (PROAIR HFA) 108 (90 Base) MCG/ACT inhaler Inhale 2 puffs into the lungs every 6 (six) hours as needed for wheezing or shortness of breath.  . ALPRAZolam (XANAX) 0.25 MG tablet Take 1 tablet (0.25 mg total) by mouth at bedtime as needed for anxiety.  Marland Kitchen azelastine (ASTELIN) 0.1 % nasal spray Place 2 sprays into both nostrils 2 (two) times daily as needed for allergies.   . Cholecalciferol (VITAMIN D) 2000 units CAPS Take 2,000 Units by mouth daily.  Marland Kitchen diltiazem (CARDIZEM CD) 120 MG 24 hr capsule Take 1 capsule (120 mg total) by mouth every evening.  . diltiazem (CARDIZEM CD) 180 MG 24 hr capsule Take 1 capsule (180 mg total)  by mouth every morning.  . diphenhydrAMINE (BENADRYL) 25 mg capsule Take 25 mg by mouth every 6 (six) hours as needed for itching.  . diphenoxylate-atropine (LOMOTIL) 2.5-0.025 MG tablet Take 1 tablet by mouth 4 (four) times daily. (Patient taking differently: Take 1 tablet by mouth 4 (four) times daily as needed for diarrhea or loose stools. )  . DULoxetine (CYMBALTA) 30 MG capsule TAKE 1 CAPSULE(30 MG) BY MOUTH DAILY  . fluticasone (FLONASE) 50 MCG/ACT nasal spray  Place 2 sprays into both nostrils 2 (two) times daily. For nasal congestion  . furosemide (LASIX) 20 MG tablet Take 1 tablet (20 mg total) by mouth daily.  Marland Kitchen HYDROcodone-acetaminophen (NORCO) 10-325 MG tablet Take 1 tablet by mouth every 4 (four) hours as needed.  Marland Kitchen ipratropium-albuterol (DUONEB) 0.5-2.5 (3) MG/3ML SOLN Take 3 mLs by nebulization 3 (three) times daily. (Patient taking differently: Take 3 mLs by nebulization every 6 (six) hours as needed (cough or SOB). As needed)  . levothyroxine (SYNTHROID, LEVOTHROID) 137 MCG tablet Take 137 mcg by mouth every morning. For thyroid therapy  . lidocaine-prilocaine (EMLA) cream Apply 1 application topically as needed.  . loperamide (IMODIUM) 2 MG capsule Take 2 capsules (4 mg total) by mouth 3 (three) times daily.  . mirtazapine (REMERON) 15 MG tablet Take 1 tablet (15 mg total) by mouth at bedtime.  Marland Kitchen morphine (MS CONTIN) 15 MG 12 hr tablet Take 1 tablet (15 mg total) by mouth every 12 (twelve) hours.  . polyvinyl alcohol (LIQUIFILM TEARS) 1.4 % ophthalmic solution 1 drop 2 (two) times daily as needed for dry eyes.  . potassium chloride (K-DUR) 10 MEQ tablet Take 10 mEq by mouth daily.  . Tiotropium Bromide-Olodaterol (STIOLTO RESPIMAT) 2.5-2.5 MCG/ACT AERS Inhale 2 puffs into the lungs daily.  Alveda Reasons 20 MG TABS tablet TAKE 1 TABLET BY MOUTH EVERY DAY WITH SUPPER  . [DISCONTINUED] Calcium Carbonate (CALCIUM 600) 1500 MG TABS Take 2 tablets by mouth daily.     No facility-administered encounter medications on file as of 02/28/2018.      Review of Systems  Constitutional:   No  weight loss, night sweats,  Fevers, chills, fatigue, or  lassitude.  HEENT:   No headaches,  Difficulty swallowing,  Tooth/dental problems, or  Sore throat,                No sneezing, itching, ear ache, nasal congestion, post nasal drip,   CV:  No chest pain,  Orthopnea, PND, swelling in lower extremities, anasarca, dizziness, palpitations, syncope.   GI  No  heartburn, indigestion, abdominal pain, nausea, vomiting, diarrhea, change in bowel habits, loss of appetite, bloody stools.   Resp: No shortness of breath with exertion or at rest.  No excess mucus, no productive cough,  No non-productive cough,  No coughing up of blood.  No change in color of mucus.  No wheezing.  No chest wall deformity  Skin: no rash or lesions.  GU: no dysuria, change in color of urine, no urgency or frequency.  No flank pain, no hematuria   MS:  No joint pain or swelling.  No decreased range of motion.  No back pain.    Physical Exam  BP (!) 148/62 (BP Location: Left Arm, Cuff Size: Normal)   Pulse (!) 115   Ht 5' 5"  (1.651 m)   Wt 163 lb 3.2 oz (74 kg)   SpO2 96%   BMI 27.16 kg/m   GEN: A/Ox3; pleasant , NAD, well nourished  HEENT:  [place]/AT,  EACs-clear, TMs-wnl, NOSE-clear, THROAT-clear, no lesions, no postnasal drip or exudate noted.   NECK:  Supple w/ fair ROM; no JVD; normal carotid impulses w/o bruits; no thyromegaly or nodules palpated; no lymphadenopathy.    RESP  Clear  P & A; w/o, wheezes/ rales/ or rhonchi. no accessory muscle use, no dullness to percussion  CARD:  RRR, no m/r/g, no peripheral edema, pulses intact, no cyanosis or clubbing.  GI:   Soft & nt; nml bowel sounds; no organomegaly or masses detected.   Musco: Warm bil, no deformities or joint swelling noted.   Neuro: alert, no focal deficits noted.    Skin: Warm, no lesions or rashes    Lab Results:  CBC    Component Value Date/Time   WBC 4.6 02/04/2018 1238   WBC 9.2 12/17/2017 1910   RBC 3.34 (L) 02/04/2018 1238   HGB 11.1 (L) 02/04/2018 1238   HGB 10.8 (L) 09/24/2017 0945   HCT 33.0 (L) 02/04/2018 1238   HCT 32.8 (L) 09/24/2017 0945   PLT 87 (L) 02/04/2018 1238   PLT 174 09/24/2017 0945   MCV 98.9 02/04/2018 1238   MCV 93.2 09/24/2017 0945   MCH 33.3 02/04/2018 1238   MCHC 33.7 02/04/2018 1238   RDW 14.8 (H) 02/04/2018 1238   RDW 18.7 (H) 09/24/2017 0945    LYMPHSABS 1.3 02/04/2018 1238   LYMPHSABS 0.9 09/24/2017 0945   MONOABS 0.5 02/04/2018 1238   MONOABS 0.1 09/24/2017 0945   EOSABS 0.1 02/04/2018 1238   EOSABS 0.1 09/24/2017 0945   BASOSABS 0.0 02/04/2018 1238   BASOSABS 0.0 09/24/2017 0945    BMET    Component Value Date/Time   NA 137 02/04/2018 1238   NA 135 (L) 09/24/2017 0945   K 4.1 02/04/2018 1238   K 3.7 09/24/2017 0945   CL 103 02/04/2018 1238   CO2 27 02/04/2018 1238   CO2 30 (H) 09/24/2017 0945   GLUCOSE 109 02/04/2018 1238   GLUCOSE 101 09/24/2017 0945   BUN 14 02/04/2018 1238   BUN 15.3 09/24/2017 0945   CREATININE 0.80 02/04/2018 1238   CREATININE 0.8 09/24/2017 0945   CALCIUM 9.0 02/04/2018 1238   CALCIUM 9.1 09/24/2017 0945   GFRNONAA >60 02/04/2018 1238   GFRAA >60 02/04/2018 1238    BNP    Component Value Date/Time   BNP 76.7 11/12/2017 0102   BNP 51.2 09/05/2015 1118    ProBNP    Component Value Date/Time   PROBNP 837 (H) 08/12/2017 1137    Imaging: Ct Abdomen Pelvis W Contrast  Result Date: 02/25/2018 CLINICAL DATA:  Right ovarian cancer diagnosed 07/01/2017. Patient presents for restaging after interval chemotherapy. EXAM: CT ABDOMEN AND PELVIS WITH CONTRAST TECHNIQUE: Multidetector CT imaging of the abdomen and pelvis was performed using the standard protocol following bolus administration of intravenous contrast. CONTRAST:  122m ISOVUE-300 IOPAMIDOL (ISOVUE-300) INJECTION 61% COMPARISON:  11/28/2017 CT abdomen/pelvis. FINDINGS: Lower chest: Small dependent right pleural effusion, increased with increased mild dependent right lung base atelectasis. Right coronary atherosclerosis. Hepatobiliary: Normal liver size. No liver mass. Cholecystectomy. Bile ducts are stable and within normal post cholecystectomy limits with CBD diameter 8 mm. Pancreas: Cystic 0.9 cm pancreatic body lesion (series 2/image 17), decreased from 1.4 cm. No new pancreatic lesions. No pancreatic duct dilation. Spleen: Normal  size. No mass. Adrenals/Urinary Tract: Normal adrenals. No hydronephrosis. Stable subcentimeter hypodense renal cortical lesions in both kidneys, too small to characterize probably benign. No new renal lesions. Normal bladder. Stomach/Bowel:  Normal non-distended stomach. Normal caliber small bowel with no small bowel wall thickening. Appendix not discretely visualized. No pericecal inflammatory changes. Mild sigmoid diverticulosis. No definite large bowel wall thickening or acute pericolonic fat stranding. Apparent wall thickening in the sigmoid colon and rectum is favored to be due to under distention. Vascular/Lymphatic: Atherosclerotic nonaneurysmal abdominal aorta. Patent portal, splenic, hepatic and renal veins. Retroaortic left renal vein. No pathologically enlarged lymph nodes in the abdomen or pelvis. Reproductive: Right adnexal 4.0 cm cyst (series 2/image 60), previously 3.9 cm, not appreciably changed. No left adnexal mass. Grossly normal atrophic uterus. Other: No pneumoperitoneum. No focal fluid collection. No ascites. Soft tissue caking in the omentum appears qualitatively stable to slightly decreased (series 2/image 54). Stable mild peritoneal thickening in the paracolic gutters. No new focal peritoneal implants. Musculoskeletal: No aggressive appearing focal osseous lesions. Partially visualized fixation hardware in the proximal left femur. Marked lumbar spondylosis. Stable mild to moderate compression deformity of the superior L5 vertebral body. IMPRESSION: 1. Omental carcinomatosis appears qualitatively stable to slightly decreased. Stable mild peritoneal thickening in the paracolic gutters. Stable right adnexal cyst. No ascites. No new or progressive metastatic disease in the abdomen or pelvis. 2. Small dependent right pleural effusion is increased. 3. Cystic pancreatic body lesion is decreased and now subcentimeter in size, suggesting a benign lesion. 4.  Aortic Atherosclerosis (ICD10-I70.0).  Electronically Signed   By: Ilona Sorrel M.D.   On: 02/25/2018 14:16     Assessment & Plan:   Pleural effusion Right pleural effusion recurrent-he is status post thoracentesis February 2019 with 1.3 L pulled off.  Cytology showed atypical cells , CT abdomen was reviewed.  There is a small amount of effusion that possibly could undergo thoracentesis.  We will set her up for a therapeutic thoracentesis with labs for cytology. Would like to do this after her PET scan.   COPD (chronic obstructive pulmonary disease) ([facility]) Increased symptoms suspect is multifactorial with underlying COPD, deconditioning ovarian cancer and reaccumulation of right pleural effusion. We will set patient up for therapeutic thoracentesis after her upcoming PET scan next week. Continue on Stiolto. Refer to pulmonary rehab .   Plan  Patient Instructions  Refer to interventional radiology for therapeutic thoracentesis Refer to pulmonary rehab Continue on Stiolto 2 puffs daily, rinse after use Follow up with Dr. Lamonte Sakai next         Rexene Edison, NP 02/28/2018

## 2018-02-28 NOTE — Assessment & Plan Note (Addendum)
Increased symptoms suspect is multifactorial with underlying COPD, deconditioning ovarian cancer and reaccumulation of right pleural effusion. We will set patient up for therapeutic thoracentesis after her upcoming PET scan next week. Continue on Stiolto. Refer to pulmonary rehab .   Plan  Patient Instructions  Refer to interventional radiology for therapeutic thoracentesis (After PET scan )  Refer to pulmonary rehab Continue on Stiolto 2 puffs daily, rinse after use Follow up with Dr. Lamonte Sakai next month as planned and As needed   Please contact office for sooner follow up if symptoms do not improve or worsen or seek emergency care

## 2018-02-28 NOTE — Telephone Encounter (Signed)
Patient seen today by TP and therapeutic thoracentesis ordered  TP spoke with RB after patient left and it was decided that thoracentesis should wait until AFTER patient's PET scan scheduled for 5.31.19.  Per TP, about 1 week after the PET is okay  Thoracentesis is currently scheduled for 5.29.19  LMOM TCB x1 for patient's daughter [name] (she accompanied pt to appt today and dpr is on file) to provide the number to Central Scheduling for them to reschedule thoracentesis >> [phone]

## 2018-02-28 NOTE — Assessment & Plan Note (Signed)
Right pleural effusion recurrent-he is status post thoracentesis February 2019 with 1.3 L pulled off.  Cytology showed atypical cells , CT abdomen was reviewed.  There is a small amount of effusion that possibly could undergo thoracentesis.  We will set her up for a therapeutic thoracentesis with labs for cytology. Would like to do this after her PET scan.

## 2018-02-28 NOTE — Patient Instructions (Addendum)
Refer to interventional radiology for therapeutic thoracentesis (After PET scan )  Refer to pulmonary rehab Continue on Stiolto 2 puffs daily, rinse after use Follow up with Dr. Lamonte Sakai next month as planned and As needed   Please contact office for sooner follow up if symptoms do not improve or worsen or seek emergency care

## 2018-03-02 ENCOUNTER — Other Ambulatory Visit: Payer: Self-pay | Admitting: Hematology and Oncology

## 2018-03-02 ENCOUNTER — Other Ambulatory Visit: Payer: Self-pay | Admitting: Cardiology

## 2018-03-03 ENCOUNTER — Other Ambulatory Visit: Payer: Self-pay

## 2018-03-03 ENCOUNTER — Encounter: Payer: Self-pay | Admitting: Obstetrics

## 2018-03-03 ENCOUNTER — Encounter (HOSPITAL_COMMUNITY): Payer: Self-pay | Admitting: Emergency Medicine

## 2018-03-03 ENCOUNTER — Inpatient Hospital Stay (HOSPITAL_COMMUNITY)
Admission: EM | Admit: 2018-03-03 | Discharge: 2018-03-07 | DRG: 377 | Disposition: A | Discharge status: Home / Self Care | Payer: Medicare Other | Attending: Internal Medicine | Admitting: Internal Medicine

## 2018-03-03 DIAGNOSIS — Z87891 Personal history of nicotine dependence: Secondary | ICD-10-CM

## 2018-03-03 DIAGNOSIS — Z79890 Hormone replacement therapy (postmenopausal): Secondary | ICD-10-CM

## 2018-03-03 DIAGNOSIS — D638 Anemia in other chronic diseases classified elsewhere: Secondary | ICD-10-CM | POA: Diagnosis present

## 2018-03-03 DIAGNOSIS — K5731 Diverticulosis of large intestine without perforation or abscess with bleeding: Secondary | ICD-10-CM | POA: Diagnosis not present

## 2018-03-03 DIAGNOSIS — I5032 Chronic diastolic (congestive) heart failure: Secondary | ICD-10-CM | POA: Diagnosis present

## 2018-03-03 DIAGNOSIS — C562 Malignant neoplasm of left ovary: Secondary | ICD-10-CM | POA: Diagnosis present

## 2018-03-03 DIAGNOSIS — Z7951 Long term (current) use of inhaled steroids: Secondary | ICD-10-CM

## 2018-03-03 DIAGNOSIS — D61818 Other pancytopenia: Secondary | ICD-10-CM | POA: Diagnosis present

## 2018-03-03 DIAGNOSIS — E876 Hypokalemia: Secondary | ICD-10-CM | POA: Diagnosis not present

## 2018-03-03 DIAGNOSIS — C561 Malignant neoplasm of right ovary: Secondary | ICD-10-CM | POA: Diagnosis present

## 2018-03-03 DIAGNOSIS — M797 Fibromyalgia: Secondary | ICD-10-CM | POA: Diagnosis present

## 2018-03-03 DIAGNOSIS — T451X5A Adverse effect of antineoplastic and immunosuppressive drugs, initial encounter: Secondary | ICD-10-CM | POA: Diagnosis present

## 2018-03-03 DIAGNOSIS — I959 Hypotension, unspecified: Secondary | ICD-10-CM | POA: Diagnosis present

## 2018-03-03 DIAGNOSIS — Z8619 Personal history of other infectious and parasitic diseases: Secondary | ICD-10-CM

## 2018-03-03 DIAGNOSIS — M810 Age-related osteoporosis without current pathological fracture: Secondary | ICD-10-CM | POA: Diagnosis present

## 2018-03-03 DIAGNOSIS — D6959 Other secondary thrombocytopenia: Secondary | ICD-10-CM | POA: Diagnosis present

## 2018-03-03 DIAGNOSIS — Z79891 Long term (current) use of opiate analgesic: Secondary | ICD-10-CM

## 2018-03-03 DIAGNOSIS — R0902 Hypoxemia: Secondary | ICD-10-CM

## 2018-03-03 DIAGNOSIS — K625 Hemorrhage of anus and rectum: Secondary | ICD-10-CM | POA: Diagnosis present

## 2018-03-03 DIAGNOSIS — K219 Gastro-esophageal reflux disease without esophagitis: Secondary | ICD-10-CM | POA: Diagnosis present

## 2018-03-03 DIAGNOSIS — Z885 Allergy status to narcotic agent status: Secondary | ICD-10-CM

## 2018-03-03 DIAGNOSIS — L4050 Arthropathic psoriasis, unspecified: Secondary | ICD-10-CM | POA: Diagnosis present

## 2018-03-03 DIAGNOSIS — E440 Moderate protein-calorie malnutrition: Secondary | ICD-10-CM | POA: Diagnosis present

## 2018-03-03 DIAGNOSIS — D696 Thrombocytopenia, unspecified: Secondary | ICD-10-CM | POA: Diagnosis present

## 2018-03-03 DIAGNOSIS — G620 Drug-induced polyneuropathy: Secondary | ICD-10-CM | POA: Diagnosis present

## 2018-03-03 DIAGNOSIS — Z7901 Long term (current) use of anticoagulants: Secondary | ICD-10-CM

## 2018-03-03 DIAGNOSIS — E039 Hypothyroidism, unspecified: Secondary | ICD-10-CM | POA: Diagnosis present

## 2018-03-03 DIAGNOSIS — I739 Peripheral vascular disease, unspecified: Secondary | ICD-10-CM | POA: Diagnosis present

## 2018-03-03 DIAGNOSIS — Z800 Family history of malignant neoplasm of digestive organs: Secondary | ICD-10-CM

## 2018-03-03 DIAGNOSIS — K922 Gastrointestinal hemorrhage, unspecified: Secondary | ICD-10-CM | POA: Diagnosis present

## 2018-03-03 DIAGNOSIS — D61810 Antineoplastic chemotherapy induced pancytopenia: Secondary | ICD-10-CM | POA: Diagnosis present

## 2018-03-03 DIAGNOSIS — C786 Secondary malignant neoplasm of retroperitoneum and peritoneum: Secondary | ICD-10-CM | POA: Diagnosis present

## 2018-03-03 DIAGNOSIS — Z801 Family history of malignant neoplasm of trachea, bronchus and lung: Secondary | ICD-10-CM

## 2018-03-03 DIAGNOSIS — Z96652 Presence of left artificial knee joint: Secondary | ICD-10-CM | POA: Diagnosis present

## 2018-03-03 DIAGNOSIS — J90 Pleural effusion, not elsewhere classified: Secondary | ICD-10-CM | POA: Diagnosis present

## 2018-03-03 DIAGNOSIS — I480 Paroxysmal atrial fibrillation: Secondary | ICD-10-CM | POA: Diagnosis present

## 2018-03-03 DIAGNOSIS — Z8719 Personal history of other diseases of the digestive system: Secondary | ICD-10-CM

## 2018-03-03 DIAGNOSIS — J449 Chronic obstructive pulmonary disease, unspecified: Secondary | ICD-10-CM | POA: Diagnosis present

## 2018-03-03 DIAGNOSIS — D62 Acute posthemorrhagic anemia: Secondary | ICD-10-CM | POA: Diagnosis present

## 2018-03-03 DIAGNOSIS — Z8249 Family history of ischemic heart disease and other diseases of the circulatory system: Secondary | ICD-10-CM

## 2018-03-03 NOTE — ED Triage Notes (Signed)
Pt reports having rectal bleeding that started today that was bright red in color. Pt reports being on Xarelto for A-Fib. Last chemo treatment 3 weeks ago for ovarian and peritoneal cancer.

## 2018-03-03 NOTE — ED Notes (Signed)
Pt stated she would rather have her blood drawn from her port RN notified

## 2018-03-04 ENCOUNTER — Other Ambulatory Visit: Payer: Self-pay

## 2018-03-04 ENCOUNTER — Telehealth (HOSPITAL_COMMUNITY): Payer: Self-pay | Admitting: Internal Medicine

## 2018-03-04 ENCOUNTER — Encounter (HOSPITAL_COMMUNITY): Payer: Self-pay | Admitting: Internal Medicine

## 2018-03-04 ENCOUNTER — Ambulatory Visit (HOSPITAL_COMMUNITY): Payer: Medicare Other

## 2018-03-04 DIAGNOSIS — C561 Malignant neoplasm of right ovary: Secondary | ICD-10-CM | POA: Diagnosis not present

## 2018-03-04 DIAGNOSIS — D62 Acute posthemorrhagic anemia: Secondary | ICD-10-CM | POA: Diagnosis not present

## 2018-03-04 DIAGNOSIS — K625 Hemorrhage of anus and rectum: Secondary | ICD-10-CM | POA: Diagnosis present

## 2018-03-04 DIAGNOSIS — K922 Gastrointestinal hemorrhage, unspecified: Secondary | ICD-10-CM | POA: Diagnosis not present

## 2018-03-04 DIAGNOSIS — D689 Coagulation defect, unspecified: Secondary | ICD-10-CM | POA: Diagnosis not present

## 2018-03-04 DIAGNOSIS — D638 Anemia in other chronic diseases classified elsewhere: Secondary | ICD-10-CM

## 2018-03-04 LAB — CBC
HCT: 24.3 % — ABNORMAL LOW (ref 36.0–46.0)
HCT: 27.2 % — ABNORMAL LOW (ref 36.0–46.0)
HCT: 27.9 % — ABNORMAL LOW (ref 36.0–46.0)
HEMOGLOBIN: 9.5 g/dL — AB (ref 12.0–15.0)
HEMOGLOBIN: 9.2 g/dL — AB (ref 12.0–15.0)
Hemoglobin: 8.1 g/dL — ABNORMAL LOW (ref 12.0–15.0)
MCH: 32.3 pg (ref 26.0–34.0)
MCH: 32.5 pg (ref 26.0–34.0)
MCH: 33 pg (ref 26.0–34.0)
MCHC: 33.3 g/dL (ref 30.0–36.0)
MCHC: 33.8 g/dL (ref 30.0–36.0)
MCHC: 34.1 g/dL (ref 30.0–36.0)
MCV: 94.9 fL (ref 78.0–100.0)
MCV: 97.5 fL (ref 78.0–100.0)
MCV: 97.6 fL (ref 78.0–100.0)
PLATELETS: 91 10*3/uL — AB (ref 150–400)
PLATELETS: 99 10*3/uL — AB (ref 150–400)
Platelets: 84 10*3/uL — ABNORMAL LOW (ref 150–400)
RBC: 2.79 MIL/uL — ABNORMAL LOW (ref 3.87–5.11)
RBC: 2.49 MIL/uL — AB (ref 3.87–5.11)
RBC: 2.94 MIL/uL — AB (ref 3.87–5.11)
RDW: 13.2 % (ref 11.5–15.5)
RDW: 13.3 % (ref 11.5–15.5)
RDW: 14.9 % (ref 11.5–15.5)
WBC: 7 10*3/uL (ref 4.0–10.5)
WBC: 5.5 10*3/uL (ref 4.0–10.5)
WBC: 5.4 10*3/uL (ref 4.0–10.5)

## 2018-03-04 LAB — COMPREHENSIVE METABOLIC PANEL
ALBUMIN: 3.2 g/dL — AB (ref 3.5–5.0)
ALT: 12 U/L — ABNORMAL LOW (ref 14–54)
ALT: 11 U/L — AB (ref 14–54)
AST: 13 U/L — ABNORMAL LOW (ref 15–41)
AST: 14 U/L — ABNORMAL LOW (ref 15–41)
Albumin: 2.8 g/dL — ABNORMAL LOW (ref 3.5–5.0)
Alkaline Phosphatase: 44 U/L (ref 38–126)
Alkaline Phosphatase: 37 U/L — ABNORMAL LOW (ref 38–126)
Anion gap: 10 (ref 5–15)
Anion gap: 9 (ref 5–15)
BILIRUBIN TOTAL: 0.5 mg/dL (ref 0.3–1.2)
BUN: 19 mg/dL (ref 6–20)
BUN: 16 mg/dL (ref 6–20)
CALCIUM: 7.5 mg/dL — AB (ref 8.9–10.3)
CHLORIDE: 108 mmol/L (ref 101–111)
CHLORIDE: 105 mmol/L (ref 101–111)
CO2: 23 mmol/L (ref 22–32)
CO2: 24 mmol/L (ref 22–32)
CREATININE: 0.83 mg/dL (ref 0.44–1.00)
Calcium: 8 mg/dL — ABNORMAL LOW (ref 8.9–10.3)
Creatinine, Ser: 0.76 mg/dL (ref 0.44–1.00)
GFR calc Af Amer: >60 mL/min (ref >60)
GFR calc non Af Amer: >60 mL/min (ref >60)
Glucose, Bld: 120 mg/dL — ABNORMAL HIGH (ref 65–99)
Glucose, Bld: 123 mg/dL — ABNORMAL HIGH (ref 65–99)
Potassium: 3.4 mmol/L — ABNORMAL LOW (ref 3.5–5.1)
Potassium: 3.1 mmol/L — ABNORMAL LOW (ref 3.5–5.1)
SODIUM: 139 mmol/L (ref 135–145)
Sodium: 140 mmol/L (ref 135–145)
TOTAL PROTEIN: 5.6 g/dL — AB (ref 6.5–8.1)
Total Bilirubin: 0.4 mg/dL (ref 0.3–1.2)
Total Protein: 6.4 g/dL — ABNORMAL LOW (ref 6.5–8.1)

## 2018-03-04 LAB — ABO/RH: ABO/RH(D): O POS

## 2018-03-04 LAB — POC OCCULT BLOOD, ED: FECAL OCCULT BLD: POSITIVE — AB

## 2018-03-04 LAB — PREPARE RBC (CROSSMATCH)

## 2018-03-04 MED ORDER — MIRTAZAPINE 15 MG PO TABS
15.0000 mg | ORAL_TABLET | Freq: Every day | ORAL | Status: DC
  Administered 2018-03-04 – 2018-03-06 (×3): 15 mg via ORAL
  Filled 2018-03-04: qty 1
  Filled 2018-03-04: qty 0.5
  Filled 2018-03-04 (×2): qty 1

## 2018-03-04 MED ORDER — ALBUTEROL SULFATE (2.5 MG/3ML) 0.083% IN NEBU: 3.0000 mL | INHALATION_SOLUTION | Freq: Four times a day (QID) | RESPIRATORY_TRACT | Status: DC | PRN

## 2018-03-04 MED ORDER — ALPRAZOLAM 0.25 MG PO TABS
0.2500 mg | ORAL_TABLET | Freq: Every evening | ORAL | Status: DC | PRN
  Administered 2018-03-05 – 2018-03-06 (×2): 0.25 mg via ORAL
  Filled 2018-03-04 (×2): qty 1

## 2018-03-04 MED ORDER — ONDANSETRON HCL 4 MG/2ML IJ SOLN
4.0000 mg | Freq: Four times a day (QID) | INTRAMUSCULAR | Status: DC | PRN
Start: 2018-03-04 — End: 2018-03-07

## 2018-03-04 MED ORDER — MORPHINE SULFATE ER 15 MG PO TBCR
15.0000 mg | EXTENDED_RELEASE_TABLET | Freq: Two times a day (BID) | ORAL | Status: DC
  Administered 2018-03-04: 15 mg via ORAL
  Filled 2018-03-04 (×5): qty 1

## 2018-03-04 MED ORDER — IPRATROPIUM-ALBUTEROL 0.5-2.5 (3) MG/3ML IN SOLN
3.0000 mL | Freq: Four times a day (QID) | RESPIRATORY_TRACT | Status: DC | PRN
  Administered 2018-03-05: 3 mL via RESPIRATORY_TRACT
  Filled 2018-03-04: qty 3

## 2018-03-04 MED ORDER — DILTIAZEM HCL ER COATED BEADS 120 MG PO CP24
120.0000 mg | ORAL_CAPSULE | Freq: Every evening | ORAL | Status: DC
  Administered 2018-03-04 – 2018-03-06 (×3): 120 mg via ORAL
  Filled 2018-03-04 (×3): qty 1

## 2018-03-04 MED ORDER — SODIUM CHLORIDE 0.9 % IV BOLUS
1000.0000 mL | Freq: Once | INTRAVENOUS | Status: AC
  Administered 2018-03-04: 1000 mL via INTRAVENOUS

## 2018-03-04 MED ORDER — ONDANSETRON HCL 4 MG PO TABS: 4.0000 mg | ORAL_TABLET | Freq: Four times a day (QID) | ORAL | Status: DC | PRN

## 2018-03-04 MED ORDER — LEVOTHYROXINE SODIUM 137 MCG PO TABS
137.0000 ug | ORAL_TABLET | Freq: Every morning | ORAL | Status: DC
  Administered 2018-03-04 – 2018-03-07 (×4): 137 ug via ORAL
  Filled 2018-03-04 (×4): qty 1

## 2018-03-04 MED ORDER — DULOXETINE HCL 30 MG PO CPEP
30.0000 mg | ORAL_CAPSULE | Freq: Every day | ORAL | Status: DC
  Administered 2018-03-04 – 2018-03-07 (×4): 30 mg via ORAL
  Filled 2018-03-04 (×4): qty 1

## 2018-03-04 MED ORDER — ACETAMINOPHEN 325 MG PO TABS
650.0000 mg | ORAL_TABLET | Freq: Once | ORAL | Status: AC
  Administered 2018-03-04: 650 mg via ORAL
  Filled 2018-03-04: qty 2

## 2018-03-04 MED ORDER — SODIUM CHLORIDE 0.9 % IV SOLN
INTRAVENOUS | Status: AC
  Administered 2018-03-04: 06:00:00 via INTRAVENOUS

## 2018-03-04 MED ORDER — ACETAMINOPHEN 325 MG PO TABS: 650.0000 mg | ORAL_TABLET | Freq: Four times a day (QID) | ORAL | Status: DC | PRN

## 2018-03-04 MED ORDER — DILTIAZEM HCL ER COATED BEADS 180 MG PO CP24
180.0000 mg | ORAL_CAPSULE | Freq: Every day | ORAL | Status: DC
  Administered 2018-03-04 – 2018-03-07 (×4): 180 mg via ORAL
  Filled 2018-03-04 (×4): qty 1

## 2018-03-04 MED ORDER — ACETAMINOPHEN 650 MG RE SUPP: 650.0000 mg | Freq: Four times a day (QID) | RECTAL | Status: DC | PRN

## 2018-03-04 MED ORDER — POTASSIUM CHLORIDE 10 MEQ/50ML IV SOLN
10.0000 meq | INTRAVENOUS | Status: AC
  Administered 2018-03-04 (×2): 10 meq via INTRAVENOUS
  Filled 2018-03-04 (×3): qty 50

## 2018-03-04 MED ORDER — SODIUM CHLORIDE 0.9 % IV SOLN
Freq: Once | INTRAVENOUS | Status: AC
  Administered 2018-03-04: 12:00:00 via INTRAVENOUS

## 2018-03-04 MED ORDER — SODIUM CHLORIDE 0.9% FLUSH
10.0000 mL | INTRAVENOUS | Status: DC | PRN
  Administered 2018-03-04 – 2018-03-07 (×5): 10 mL
  Filled 2018-03-04 (×5): qty 40

## 2018-03-04 NOTE — Telephone Encounter (Signed)
Spoke with pt's daughter [name], aware of recs.  Thoracentesis has been cancelled.  Pt is currently admitted for rectal bleeding and concern for diverticulitis per pt's daughter.    TP please advise when thoracentesis needs to be rescheduled, given pt's current admission.  Scheduled for PET on 5/31.

## 2018-03-04 NOTE — ED Provider Notes (Signed)
[facility] DEPT Provider Note   CSN: [id number] Arrival date & time: 03/03/18  2257     History   Chief Complaint Chief Complaint  Patient presents with  . Rectal Bleeding    HPI NAW LASALA is a 84 y.o. female.  HPI  This is an 84 year old female with a history of COPD, ovarian cancer currently on chemotherapy, atrial fibrillation on Xarelto who presents with bright red blood per rectum.  Patient reports 3 bowel movements earlier today with blood "filling the bowl."  She has never had any GI bleeds in the past.  She denies any dizziness.  She reports intermittent crampy diffuse abdominal pain.  Currently she is without abdominal pain.  Last chemotherapy was 4 weeks ago.  She denies any fevers.  Patient has no known history of GI bleeds.  Reports she had a CT scan last week.  Chart reviewed.  CT scan from 1 week with shows mild sigmoid diverticulosis as well as some sigmoid colon thickening.  Past Medical History:  Diagnosis Date  . COPD (chronic obstructive pulmonary disease) ([place])   . Depression   . Diverticulosis   . Family hx of colon cancer   . Fibromyalgia   . GERD (gastroesophageal reflux disease)   . Hemorrhoids   . Hiatal hernia   . History of rectal polyps   . History of shingles   . History of uterine polyp   . Hypothyroidism   . IBS (irritable bowel syndrome)   . Lymphocytic colitis   . Osteoporosis   . Ovarian cancer ([place])   . Paroxysmal atrial fibrillation (HCC)   . Psoriatic arthritis ([place])   . Schatzki's ring     Patient Active Problem List   Diagnosis Date Noted  . Rectal bleeding 03/04/2018  . Thrombocytopenia ([place]) 01/03/2018  . Anemia, chronic disease 12/13/2017  . Mild protein-calorie malnutrition ([place]) 12/13/2017  . Peripheral neuropathy due to chemotherapy ([place]) 12/13/2017  . S/P thoracentesis   . Atrial fibrillation with RVR ([place]) 11/08/2017  . HCAP (healthcare-associated pneumonia) 11/08/2017  .  Pleural effusion 09/20/2017  . Acute on chronic diastolic heart failure ([place]) 08/19/2017  . Pancytopenia, acquired ([place]) 08/15/2017  . Protein-calorie malnutrition, moderate ([place]) 07/30/2017  . Generalized weakness 07/30/2017  . Antineoplastic chemotherapy induced pancytopenia ([place]) 07/20/2017  . Ovarian CA, right ([place]) 07/18/2017  . PNA (pneumonia) 07/07/2017  . Ascites 06/30/2017  . Ascites, malignant 06/30/2017  . Right ovarian cyst 01/18/2017  . Elevated CA-125 01/18/2017  . Depression 12/23/2015  . Peripheral edema 08/23/2015  . Fracture of hip, left, closed ([place]) 12/08/2013  . Hip fracture ([place]) 12/08/2013  . PAD (peripheral artery disease) ([place]) 11/25/2013  . Encounter for therapeutic drug monitoring 11/09/2013  . Atrial fibrillation ([place]) 05/07/2013  . Cough 03/20/2013  . Total knee replacement status 10/07/2012  . Knee pain 10/07/2012  . Knee stiffness 10/07/2012  . Tachycardia 09/17/2012  . Chest pain 09/17/2012  . Difficulty in walking(719.7) 09/16/2012  . Muscle weakness (generalized) 09/16/2012  . Postop Acute blood loss anemia 08/08/2012  . Instability of prosthetic knee ([place]) 08/06/2012  . Bloating 02/14/2012  . Upper abdominal pain 02/14/2012  . Allergic rhinitis, seasonal 02/15/2011  . COLITIS 02/20/2010  . Diarrhea 01/02/2010  . ABDOMINAL PAIN -GENERALIZED 01/02/2010  . PERSONAL HX COLONIC POLYPS 01/02/2010  . HYPOTHYROIDISM 12/28/2009  . COPD (chronic obstructive pulmonary disease) ([place]) 12/28/2009  . ARTHRITIS 12/28/2009    Past Surgical History:  Procedure Laterality Date  . COLONOSCOPY    .  FEMUR IM NAIL Left 12/11/2013   Procedure: INTRAMEDULLARY (IM) NAIL FEMORAL;  Surgeon: [name], MD;  Location: [facility];  Service: Orthopedics;  Laterality: Left;  . FRACTURE SURGERY Left 12/11/2013   hip  . IR FLUORO GUIDE PORT INSERTION RIGHT  07/22/2017  . IR PARACENTESIS  07/12/2017  . IR US GUIDE VASC ACCESS RIGHT  07/22/2017  . KNEE SURGERY Left     x 2  . LAPAROSCOPIC CHOLECYSTECTOMY  Dec 04, 2010  . TOTAL KNEE REVISION  08/06/2012   Procedure: TOTAL KNEE REVISION;  Surgeon: [name], MD;  Location: [facility];  Service: Orthopedics;  Laterality: Left;  Left Total Knee Arthroplasty Revision  . Uterine polypectomy       OB History   None      Home Medications    Prior to Admission medications   Medication Sig Start Date End Date Taking? Authorizing Provider  albuterol (PROAIR HFA) 108 (90 Base) MCG/ACT inhaler Inhale 2 puffs into the lungs every 6 (six) hours as needed for wheezing or shortness of breath. 06/19/17  Yes Collene Gobble, MD  ALPRAZolam Duanne Moron) 0.25 MG tablet Take 1 tablet (0.25 mg total) by mouth at bedtime as needed for anxiety. 11/15/17  Yes Amin, Ankit Chirag, MD  azelastine (ASTELIN) 0.1 % nasal spray Place 2 sprays into both nostrils 2 (two) times daily as needed for allergies.    Yes [provider]  Cholecalciferol (VITAMIN D) 2000 units CAPS Take 2,000 Units by mouth daily.   Yes [provider]  diltiazem (CARDIZEM CD) 120 MG 24 hr capsule Take 1 capsule (120 mg total) by mouth every evening. 02/19/18 05/20/18 Yes Satira Sark, MD  diltiazem (CARDIZEM CD) 180 MG 24 hr capsule Take 1 capsule (180 mg total) by mouth every morning. 02/19/18 05/20/18 Yes Satira Sark, MD  diphenhydrAMINE (BENADRYL) 25 mg capsule Take 25 mg by mouth every 6 (six) hours as needed for itching.   Yes [provider]  diphenoxylate-atropine (LOMOTIL) 2.5-0.025 MG tablet Take 1 tablet by mouth 4 (four) times daily. Patient taking differently: Take 1 tablet by mouth 4 (four) times daily as needed for diarrhea or loose stools.  08/21/16  Yes [name], MD  DULoxetine (CYMBALTA) 30 MG capsule TAKE 1 CAPSULE(30 MG) BY MOUTH DAILY 02/17/18  Yes Gorsuch, Ni, MD  fluticasone (FLONASE) 50 MCG/ACT nasal spray Place 2 sprays into both nostrils 2 (two) times daily. For nasal congestion 11/20/13  Yes [provider]  furosemide (LASIX) 20 MG tablet Take 1 tablet (20 mg total) by mouth daily. 11/04/17  Yes Gorsuch, Ni, MD  HYDROcodone-acetaminophen (NORCO) 10-325 MG tablet Take 1 tablet by mouth every 4 (four) hours as needed. Patient taking differently: Take 1 tablet by mouth every 4 (four) hours as needed for moderate pain.  02/04/18  Yes Gorsuch, Ni, MD  ipratropium-albuterol (DUONEB) 0.5-2.5 (3) MG/3ML SOLN Take 3 mLs by nebulization 3 (three) times daily. Patient taking differently: Take 3 mLs by nebulization every 6 (six) hours as needed (cough or SOB). As needed 11/15/17  Yes Amin, Ankit Chirag, MD  levothyroxine (SYNTHROID, LEVOTHROID) 137 MCG tablet Take 137 mcg by mouth every morning. For thyroid therapy   Yes [provider]  lidocaine-prilocaine (EMLA) cream Apply 1 application topically as needed. Patient taking differently: Apply 1 application topically as needed (port).  07/30/17  Yes Gorsuch, Ni, MD  loperamide (IMODIUM) 2 MG capsule Take 2 capsules (4 mg total) by mouth 3 (three)  times daily. 07/24/17  Yes Gorsuch, Ni, MD  mirtazapine (REMERON) 15 MG tablet Take 1 tablet (15 mg total) by mouth at bedtime. 11/04/17  Yes Gorsuch, Ni, MD  morphine (MS CONTIN) 15 MG 12 hr tablet Take 1 tablet (15 mg total) by mouth every 12 (twelve) hours. 02/04/18  Yes Gorsuch, Ni, MD  polyvinyl alcohol (LIQUIFILM TEARS) 1.4 % ophthalmic solution 1 drop 2 (two) times daily as needed for dry eyes.   Yes [provider]  potassium chloride (K-DUR) 10 MEQ tablet Take 10 mEq by mouth daily.   Yes [provider]  Tiotropium Bromide-Olodaterol (STIOLTO RESPIMAT) 2.5-2.5 MCG/ACT AERS Inhale 2 puffs into the lungs daily. 01/21/18  Yes Collene Gobble, MD  XARELTO 20 MG TABS tablet TAKE 1 TABLET BY MOUTH EVERY DAY WITH SUPPER 04/23/17  Yes Satira Sark, MD  Calcium Carbonate (CALCIUM 600) 1500 MG TABS Take 2 tablets by mouth daily.    12/18/11  [provider]    Family  History Family History  Problem Relation Age of Onset  . Heart disease Father        pacemaker  . Lung cancer Father        hx of smoker  . Colon cancer Mother        dx in her mid 33's  . Colon cancer Maternal Grandfather   . Depression Sister     Social History Social History   Tobacco Use  . Smoking status: Former Smoker    Packs/day: 1.00    Years: 20.00    Pack years: 20.00    Types: Cigarettes    Last attempt to quit: 10/08/1980    Years since quitting: 37.4  . Smokeless tobacco: Never Used  Substance Use Topics  . Alcohol use: Yes    Comment: rarely, 12-23-15 rarely  . Drug use: No     Allergies   Codeine   Review of Systems Review of Systems  Constitutional: Negative for fever.  Respiratory: Negative for shortness of breath.   Cardiovascular: Negative for chest pain.  Gastrointestinal: Positive for abdominal pain, blood in stool and diarrhea. Negative for constipation, nausea and vomiting.  Genitourinary: Negative for dysuria.  Neurological: Negative for dizziness.  All other systems reviewed and are negative.    Physical Exam Updated Vital Signs BP (!) 114/58   Pulse 99   Temp 98 F (36.7 C) (Oral)   Resp 15   Ht 5\' 4"  (1.626 m)   Wt 73.5 kg (162 lb)   SpO2 (!) 88%   BMI 27.81 kg/m   Physical Exam  Constitutional: She is oriented to person, place, and time. She appears well-developed and well-nourished. No distress.  Chronically ill-appearing  HENT:  Head: Normocephalic and atraumatic.  Eyes: Pupils are equal, round, and reactive to light.  Neck: Neck supple.  Cardiovascular: Regular rhythm and normal heart sounds.  Tachycardia  Pulmonary/Chest: Effort normal and breath sounds normal. No respiratory distress. She has no wheezes.  Abdominal: Soft. Bowel sounds are normal. There is tenderness. There is no rebound and no guarding.  Slight left lower quadrant tenderness to palpation, no rebound or guarding  Genitourinary:  Genitourinary  Comments: Gross blood on rectal exam  Musculoskeletal: She exhibits no edema.  Neurological: She is alert and oriented to person, place, and time.  Skin: Skin is warm and dry.  Psychiatric: She has a normal mood and affect.  Nursing note and vitals reviewed.    ED Treatments / Results  Labs (all  labs ordered are listed, but only abnormal results are displayed) Labs Reviewed  COMPREHENSIVE METABOLIC PANEL - Abnormal; Notable for the following components:      Result Value   Potassium 3.4 (*)    Glucose, Bld 120 (*)    Calcium 8.0 (*)    Total Protein 6.4 (*)    Albumin 3.2 (*)    AST 13 (*)    ALT 11 (*)    All other components within normal limits  CBC - Abnormal; Notable for the following components:   RBC 2.79 (*)    Hemoglobin 9.2 (*)    HCT 27.2 (*)    Platelets 99 (*)    All other components within normal limits  POC OCCULT BLOOD, ED - Abnormal; Notable for the following components:   Fecal Occult Bld POSITIVE (*)    All other components within normal limits  TYPE AND SCREEN  ABO/RH    EKG None  Radiology No results found.  Procedures Procedures (including critical care time)  Medications Ordered in ED Medications  sodium chloride 0.9 % bolus 1,000 mL (1,000 mLs Intravenous New Bag/Given 03/04/18 0035)     Initial Impression / Assessment and Plan / ED Course  I have reviewed the triage vital signs and the nursing notes.  Pertinent labs & imaging results that were available during my care of the patient were reviewed by me and considered in my medical decision making (see chart for details).  Clinical Course as of Mar 04 324  Tue Mar 04, 2018  0325 Discussed with Dr. Silverio Decamp.  Patient on list for GI evaluation in the morning.  Keep n.p.o.  Patient is hemodynamically stable.   [CH]    Clinical Course User Index [CH] Dimitri Dsouza, Barbette Hair, MD    Patient presents with reported lower GI bleed.  She is overall hemodynamically stable.  Mild tachycardia on  initial exam.  Patient was given fluids.  She does have evidence of blood on rectal exam.  She has not had any further grossly bloody bowel movements.  Hemoglobin is down to 9 from a baseline of 11.  CT scan reviewed as above.  Given otherwise benign abdominal exam, suspect likely diverticular bleed.  Patient was discussed as above with GI.  Will plan to admit to the hospital for further evaluation.  Patient was discussed with Dr. Hal Hope.  Final Clinical Impressions(s) / ED Diagnoses   Final diagnoses:  Lower GI bleed    ED Discharge Orders    None       Merryl Hacker, MD 03/04/18 [phone]

## 2018-03-04 NOTE — Progress Notes (Signed)
ED TO INPATIENT HANDOFF REPORT  Name/Age/Gender [name] 84 y.o. female  Code Status Code Status History    Date Active Date Inactive Code Status Order ID Comments User Context   07/18/2017 1405 07/24/2017 1717 Full Code [id number]  Heath Lark, MD Inpatient   07/07/2017 2023 07/10/2017 1749 Full Code [id number]  Theodis Blaze, MD ED   06/30/2017 1709 07/01/2017 1714 Full Code [id number]  Donne Hazel, MD ED   12/11/2013 1842 12/14/2013 1815 Full Code [id number]  [name], MD Inpatient   12/08/2013 2038 12/11/2013 1842 Full Code [id number]  Kinnie Feil, MD Inpatient   08/06/2012 1317 08/09/2012 1422 Full Code [id number]  Silk, Steva Colder, RN Inpatient      Home/SNF/Other Home  Chief Complaint Chemo Card; Rectal Bleeding   Level of Care/Admitting Diagnosis ED Disposition    ED Disposition Condition Comment   [place]: [facility] [[id number]]  Level of Care: Telemetry [5]  Admit to tele based on following criteria: Monitor for Ischemic changes  Diagnosis: Rectal bleeding [462703]  Admitting Physician: Rise Patience [phone]  Attending Physician: Rise Patience [phone]  PT Class (Do Not Modify): Observation [104]  PT Acc Code (Do Not Modify): Observation [10022]       Medical History Past Medical History:  Diagnosis Date  . COPD (chronic obstructive pulmonary disease) ([place])   . Depression   . Diverticulosis   . Family hx of colon cancer   . Fibromyalgia   . GERD (gastroesophageal reflux disease)   . Hemorrhoids   . Hiatal hernia   . History of rectal polyps   . History of shingles   . History of uterine polyp   . Hypothyroidism   . IBS (irritable bowel syndrome)   . Lymphocytic colitis   . Osteoporosis   . Ovarian cancer ([place])   . Paroxysmal atrial fibrillation (HCC)   . Psoriatic arthritis ([place])   . Schatzki's ring     Allergies Allergies  Allergen Reactions  . Codeine Itching    IV  Location/Drains/Wounds Patient Lines/Drains/Airways Status   Active Line/Drains/Airways    Name:   Placement date:   Placement time:   Site:   Days:   Implanted Port 07/22/17 Right Chest   07/22/17    1319    Chest   225   Implanted Port 03/04/18 Right Chest   03/04/18    0047    Chest   less than 1          Labs/Imaging Results for orders placed or performed during the hospital encounter of 03/03/18 (from the past 48 hour(s))  POC occult blood, ED     Status: Abnormal   Collection Time: 03/04/18 12:19 AM  Result Value Ref Range   Fecal Occult Bld POSITIVE (A) NEGATIVE  Comprehensive metabolic panel     Status: Abnormal   Collection Time: 03/04/18 12:48 AM  Result Value Ref Range   Sodium 139 135 - 145 mmol/L   Potassium 3.4 (L) 3.5 - 5.1 mmol/L   Chloride 105 101 - 111 mmol/L   CO2 24 22 - 32 mmol/L   Glucose, Bld 120 (H) 65 - 99 mg/dL   BUN 19 6 - 20 mg/dL   Creatinine, Ser 0.83 0.44 - 1.00 mg/dL   Calcium 8.0 (L) 8.9 - 10.3 mg/dL   Total Protein 6.4 (L) 6.5 - 8.1 g/dL   Albumin 3.2 (L) 3.5 - 5.0 g/dL   AST 13 (L) 15 -  41 U/L   ALT 11 (L) 14 - 54 U/L   Alkaline Phosphatase 44 38 - 126 U/L   Total Bilirubin 0.4 0.3 - 1.2 mg/dL   GFR calc non Af Amer >60 >60 mL/min   GFR calc Af Amer >60 >60 mL/min    Comment: (NOTE) The eGFR has been calculated using the CKD EPI equation. This calculation has not been validated in all clinical situations. eGFR's persistently <60 mL/min signify possible Chronic Kidney Disease.    Anion gap 10 5 - 15    Comment: Performed at [facility], [address] [place], [place] [address]  CBC     Status: Abnormal   Collection Time: 03/04/18 12:48 AM  Result Value Ref Range   WBC 7.0 4.0 - 10.5 K/uL   RBC 2.79 (L) 3.87 - 5.11 MIL/uL   Hemoglobin 9.2 (L) 12.0 - 15.0 g/dL   HCT 27.2 (L) 36.0 - 46.0 %   MCV 97.5 78.0 - 100.0 fL   MCH 33.0 26.0 - 34.0 pg   MCHC 33.8 30.0 - 36.0 g/dL   RDW 13.2 11.5 - 15.5 %   Platelets 99  (L) 150 - 400 K/uL    Comment: SPECIMEN CHECKED FOR CLOTS REPEATED TO VERIFY PLATELET COUNT CONFIRMED BY SMEAR Performed at [facility], [address] [place], [place] [address]   Type and screen [facility]     Status: None   Collection Time: 03/04/18 12:48 AM  Result Value Ref Range   ABO/RH(D) O POS    Antibody Screen NEG    Sample Expiration      03/07/2018 Performed at  [facility], [address] [place], [place] [address]    No results found.  Pending Labs Unresulted Labs (From admission, onward)   Start     Ordered   03/04/18 0047  ABO/Rh  Once,   R     03/04/18 0047      Vitals/Pain Today's Vitals   03/04/18 0335 03/04/18 0340 03/04/18 0345 03/04/18 0401  BP:  (!) 91/52  117/61  Pulse: 100 97 (!) 108 (!) 116  Resp:    18  Temp:    98 F (36.7 C)  TempSrc:    Oral  SpO2: (!) 87% (!) 87% 98% 97%  Weight:      Height:      PainSc:        Isolation Precautions No active isolations  Medications Medications  sodium chloride 0.9 % bolus 1,000 mL (0 mLs Intravenous Stopped 03/04/18 0105)    Mobility walks with device  

## 2018-03-04 NOTE — Telephone Encounter (Signed)
lmtcb X1 for daughter [name] to make aware of TP's recs.   Will route back to JJ's box as this was not generated in triage.

## 2018-03-04 NOTE — Telephone Encounter (Signed)
So sorry to hear that . Once she is out of  Hospital let us know. After she gets PET scan we can set up thoracentesis .  Hopefully she will be out in time for Thoracentesis this Friday .

## 2018-03-04 NOTE — Telephone Encounter (Signed)
Pt daughter, [name], returning call. Phone number to [employer] and request to sched Thoracentesis after PET scan provided to pt's daughter. Per [name], pt is in the hospital and may not be able to do PET Scan. Cb is [phone].

## 2018-03-04 NOTE — Telephone Encounter (Signed)
Pt's daughter called back, made aware of recs.  Daughter will call back to reschedule thoracentesis after pt is d/c'ed from hospital.  Nothing further needed at this time.

## 2018-03-04 NOTE — Consult Note (Addendum)
Consultation  Referring Provider:  [facility]/ Hal Hope Primary Care Physician:  Asencion Noble, MD Primary Gastroenterologist:  Dr.Emmry Hinsch Henrene Pastor   Reason for Consultation:  GI Bleed  HPI: [name] is a 84 y.o. female, known to Dr. Scarlette Shorts, with history of lymphocytic colitis, IBS, diverticulosis, and GERD with history of Schatzki's ring. Patient also has history of COPD, congestive heart failure with EF of 60%, atrial fibrillation for which she is on Xarelto, fibromyalgia, and is currently undergoing chemotherapy for ovarian cancer.   Patient was admitted late last evening after acute onset of rectal bleeding with large volume bright red blood which was painless.  She had about 4 episodes prior to coming to the emergency room and a couple of episodes after arrival.  Her last episode of bleeding was about 6 AM this morning, and by report a smaller amount and did contain clots. Patient did take her Xarelto last night. She is being monitored for pancytopenia related to chemotherapy with carboplatin.  Her last treatment was 4 weeks ago with adjusted dose.  Her hemoglobin on 02/04/2018 was 11.1 hematocrit of 33 and platelets of 87,000 Hemoglobin was 9.2 on arrival last evening and down to 8.1 this morning, platelets 91,000. Patient has not had any prior history of GI bleeding. She says she feels okay this morning, just a bit weak.  She has been hemodynamically stable though was hypotensive on arrival to the ER.  Last colonoscopy was done in 2011 with finding of moderate left colon diverticulosis and internal hemorrhoids.  Last EGD 2013 revealed a Schatzki's ring and was otherwise negative.  Last CT abdomen and pelvis was done 02/25/2018, showing mild sigmoid diverticulosis, stable right adnexal lesion 4 cm, no ascites noted, there was omental caking consistent with omental carcinomatosis.  She also has a stable cystic pancreatic body lesion   Past Medical History:  Diagnosis Date    . COPD (chronic obstructive pulmonary disease) ([place])   . Depression   . Diverticulosis   . Family hx of colon cancer   . Fibromyalgia   . GERD (gastroesophageal reflux disease)   . Hemorrhoids   . Hiatal hernia   . History of rectal polyps   . History of shingles   . History of uterine polyp   . Hypothyroidism   . IBS (irritable bowel syndrome)   . Lymphocytic colitis   . Osteoporosis   . Ovarian cancer ([place])   . Paroxysmal atrial fibrillation ([facility])   . Psoriatic arthritis ([place])   . Schatzki's ring     Past Surgical History:  Procedure Laterality Date  . COLONOSCOPY    . FEMUR IM NAIL Left 12/11/2013   Procedure: INTRAMEDULLARY (IM) NAIL FEMORAL;  Surgeon: [name], MD;  Location: [facility];  Service: Orthopedics;  Laterality: Left;  . FRACTURE SURGERY Left 12/11/2013   hip  . IR FLUORO GUIDE PORT INSERTION RIGHT  07/22/2017  . IR PARACENTESIS  07/12/2017  . IR US GUIDE VASC ACCESS RIGHT  07/22/2017  . KNEE SURGERY Left    x 2  . LAPAROSCOPIC CHOLECYSTECTOMY  Dec 04, 2010  . TOTAL KNEE REVISION  08/06/2012   Procedure: TOTAL KNEE REVISION;  Surgeon: [name], MD;  Location: [facility];  Service: Orthopedics;  Laterality: Left;  Left Total Knee Arthroplasty Revision  . Uterine polypectomy      Prior to Admission medications   Medication Sig Start Date End Date Taking? Authorizing Provider  albuterol (PROAIR HFA) 108 (90 Base) MCG/ACT  inhaler Inhale 2 puffs into the lungs every 6 (six) hours as needed for wheezing or shortness of breath. 06/19/17  Yes Collene Gobble, MD  ALPRAZolam Duanne Moron) 0.25 MG tablet Take 1 tablet (0.25 mg total) by mouth at bedtime as needed for anxiety. 11/15/17  Yes Amin, Ankit Chirag, MD  azelastine (ASTELIN) 0.1 % nasal spray Place 2 sprays into both nostrils 2 (two) times daily as needed for allergies.    Yes [provider]  Cholecalciferol (VITAMIN D) 2000 units CAPS Take 2,000 Units by mouth daily.   Yes [provider]   diltiazem (CARDIZEM CD) 120 MG 24 hr capsule Take 1 capsule (120 mg total) by mouth every evening. 02/19/18 05/20/18 Yes Satira Sark, MD  diltiazem (CARDIZEM CD) 180 MG 24 hr capsule Take 1 capsule (180 mg total) by mouth every morning. 02/19/18 05/20/18 Yes Satira Sark, MD  diphenhydrAMINE (BENADRYL) 25 mg capsule Take 25 mg by mouth every 6 (six) hours as needed for itching.   Yes [provider]  diphenoxylate-atropine (LOMOTIL) 2.5-0.025 MG tablet Take 1 tablet by mouth 4 (four) times daily. Patient taking differently: Take 1 tablet by mouth 4 (four) times daily as needed for diarrhea or loose stools.  08/21/16  Yes [name], MD  DULoxetine (CYMBALTA) 30 MG capsule TAKE 1 CAPSULE(30 MG) BY MOUTH DAILY 02/17/18  Yes Gorsuch, Ni, MD  fluticasone (FLONASE) 50 MCG/ACT nasal spray Place 2 sprays into both nostrils 2 (two) times daily. For nasal congestion 11/20/13  Yes [provider]  furosemide (LASIX) 20 MG tablet Take 1 tablet (20 mg total) by mouth daily. 11/04/17  Yes Gorsuch, Ni, MD  HYDROcodone-acetaminophen (NORCO) 10-325 MG tablet Take 1 tablet by mouth every 4 (four) hours as needed. Patient taking differently: Take 1 tablet by mouth every 4 (four) hours as needed for moderate pain.  02/04/18  Yes Gorsuch, Ni, MD  ipratropium-albuterol (DUONEB) 0.5-2.5 (3) MG/3ML SOLN Take 3 mLs by nebulization 3 (three) times daily. Patient taking differently: Take 3 mLs by nebulization every 6 (six) hours as needed (cough or SOB). As needed 11/15/17  Yes Amin, Ankit Chirag, MD  levothyroxine (SYNTHROID, LEVOTHROID) 137 MCG tablet Take 137 mcg by mouth every morning. For thyroid therapy   Yes [provider]  lidocaine-prilocaine (EMLA) cream Apply 1 application topically as needed. Patient taking differently: Apply 1 application topically as needed (port).  07/30/17  Yes Gorsuch, Ni, MD  loperamide (IMODIUM) 2 MG capsule Take 2 capsules (4 mg total) by mouth 3 (three)  times daily. 07/24/17  Yes Gorsuch, Ni, MD  morphine (MS CONTIN) 15 MG 12 hr tablet Take 1 tablet (15 mg total) by mouth every 12 (twelve) hours. 02/04/18  Yes Gorsuch, Ni, MD  polyvinyl alcohol (LIQUIFILM TEARS) 1.4 % ophthalmic solution 1 drop 2 (two) times daily as needed for dry eyes.   Yes [provider]  potassium chloride (K-DUR) 10 MEQ tablet Take 10 mEq by mouth daily.   Yes [provider]  Tiotropium Bromide-Olodaterol (STIOLTO RESPIMAT) 2.5-2.5 MCG/ACT AERS Inhale 2 puffs into the lungs daily. 01/21/18  Yes Collene Gobble, MD  mirtazapine (REMERON) 15 MG tablet TAKE 1 TABLET(15 MG) BY MOUTH AT BEDTIME 03/04/18   Heath Lark, MD  XARELTO 20 MG TABS tablet TAKE 1 TABLET BY MOUTH EVERY DAY WITH SUPPER 03/04/18   Satira Sark, MD  Calcium Carbonate (CALCIUM 600) 1500 MG TABS Take 2 tablets by mouth daily.    12/18/11  [provider]    Current Facility-Administered Medications  Medication Dose Route Frequency Provider Last Rate Last Dose  . 0.9 %  sodium chloride infusion   Intravenous Continuous Rise Patience, MD 100 mL/hr at 03/04/18 0550    . acetaminophen (TYLENOL) tablet 650 mg  650 mg Oral Q6H PRN Rise Patience, MD       Or  . acetaminophen (TYLENOL) suppository 650 mg  650 mg Rectal Q6H PRN Rise Patience, MD      . albuterol (PROVENTIL) (2.5 MG/3ML) 0.083% nebulizer solution 3 mL  3 mL Inhalation Q6H PRN Rise Patience, MD      . ALPRAZolam Duanne Moron) tablet 0.25 mg  0.25 mg Oral QHS PRN Rise Patience, MD      . diltiazem (CARDIZEM CD) 24 hr capsule 120 mg  120 mg Oral QPM Rise Patience, MD      . diltiazem (CARDIZEM CD) 24 hr capsule 180 mg  180 mg Oral Daily Rise Patience, MD      . DULoxetine (CYMBALTA) DR capsule 30 mg  30 mg Oral Daily Rise Patience, MD      . ipratropium-albuterol (DUONEB) 0.5-2.5 (3) MG/3ML nebulizer solution 3 mL  3 mL Nebulization Q6H PRN Rise Patience, MD      .  levothyroxine (SYNTHROID, LEVOTHROID) tablet 137 mcg  137 mcg Oral q morning - 10a Rise Patience, MD   137 mcg at 03/04/18 0831  . mirtazapine (REMERON) tablet 15 mg  15 mg Oral QHS Rise Patience, MD      . morphine (MS CONTIN) 12 hr tablet 15 mg  15 mg Oral Q12H Rise Patience, MD      . ondansetron [facility]) tablet 4 mg  4 mg Oral Q6H PRN Rise Patience, MD       Or  . ondansetron [facility]) injection 4 mg  4 mg Intravenous Q6H PRN Rise Patience, MD      . sodium chloride flush (NS) 0.9 % injection 10-40 mL  10-40 mL Intracatheter PRN Nita Sells, MD        Allergies as of 03/03/2018 - Review Complete 03/03/2018  Allergen Reaction Noted  . Codeine Itching     Family History  Problem Relation Age of Onset  . Heart disease Father        pacemaker  . Lung cancer Father        hx of smoker  . Colon cancer Mother        dx in her mid 37's  . Colon cancer Maternal Grandfather   . Depression Sister     Social History   Socioeconomic History  . Marital status: Divorced    Spouse name: Not on file  . Number of children: 1  . Years of education: 52  . Highest education level: GED or equivalent  Occupational History  . Occupation: retired    Fish farm manager: RETIRED  Social Needs  . Financial resource strain: Not hard at all  . Food insecurity:    Worry: Never true    Inability: Never true  . Transportation needs:    Medical: No    Non-medical: No  Tobacco Use  . Smoking status: Former Smoker    Packs/day: 1.00    Years: 20.00    Pack years: 20.00    Types: Cigarettes    Last attempt to quit: 10/08/1980    Years since quitting: 37.4  . Smokeless tobacco: Never Used  Substance  and Sexual Activity  . Alcohol use: Yes    Comment: rarely, 12-23-15 rarely  . Drug use: No  . Sexual activity: Not Currently  Lifestyle  . Physical activity:    Days per week: 0 days    Minutes per session: 0 min  . Stress: Not at all  Relationships  . Social  connections:    Talks on phone: More than three times a week    Gets together: Three times a week    Attends religious service: More than 4 times per year    Active member of club or organization: No    Attends meetings of clubs or organizations: Never    Relationship status: Divorced  . Intimate partner violence:    Fear of current or ex partner: No    Emotionally abused: No    Physically abused: No    Forced sexual activity: No  Other Topics Concern  . Not on file  Social History Narrative   Patient lives at home by herself.    Patient is retired.    Patient has 12 th grade education.           Review of Systems: Pertinent positive and negative review of systems were noted in the above HPI section.  All other review of systems was otherwise negative.Marland Kitchen  Physical Exam: Vital signs in last 24 hours: Temp:  [98 F (36.7 C)-98.1 F (36.7 C)] 98.1 F (36.7 C) (05/28 0535) Pulse Rate:  [96-140] 104 (05/28 0535) Resp:  [15-18] 16 (05/28 0535) BP: (78-157)/(52-66) 117/66 (05/28 0535) SpO2:  [71 %-100 %] 100 % (05/28 0535) Weight:  [162 lb (73.5 kg)-165 lb 12.6 oz (75.2 kg)] 165 lb 12.6 oz (75.2 kg) (05/28 0538) Last BM Date: 03/04/18 General:   Alert,  Well-developed, well-nourished,elderly WF  pleasant and cooperative in NAD. Pale  Head:  Normocephalic and atraumatic. Eyes:  Sclera clear, no icterus.   Conjunctiva pale. Ears:  Normal auditory acuity. Nose:  No deformity, discharge,  or lesions. Mouth:  No deformity or lesions.   Neck:  Supple; no masses or thyromegaly. Lungs:  Clear throughout to auscultation.   No wheezes, crackles, or rhonchi. Portacath right chest  Heart:  irRegular rate and rhythm; no murmurs, clicks, rubs,  or gallops. Abdomen:  Soft,nontender, BS active,nonpalp mass or hsm.   Rectal:  Deferred - documented bloody stool Msk:  Symmetrical without gross deformities. . Pulses:  Normal pulses noted. Extremities:  Without clubbing or edema. Neurologic:   Alert and  oriented x4;  grossly normal neurologically. Skin:  Intact without significant lesions or rashes.. Psych:  Alert and cooperative. Normal mood and affect.  Intake/Output from previous day: 05/27 0701 - 05/28 0700 In: 1000 [IV Piggyback:1000] Out: -  Intake/Output this shift: No intake/output data recorded.  Lab Results: Recent Labs    03/04/18 0048 03/04/18 0607  WBC 7.0 5.5  HGB 9.2* 8.1*  HCT 27.2* 24.3*  PLT 99* 91*   BMET Recent Labs    03/04/18 0048 03/04/18 0607  NA 139 140  K 3.4* 3.1*  CL 105 108  CO2 24 23  GLUCOSE 120* 123*  BUN 19 16  CREATININE 0.83 0.76  CALCIUM 8.0* 7.5*   LFT Recent Labs    03/04/18 0607  PROT 5.6*  ALBUMIN 2.8*  AST 14*  ALT 12*  ALKPHOS 37*  BILITOT 0.5   PT/INR No results for input(s): LABPROT, INR in the last 72 hours. Hepatitis Panel No results for input(s): HEPBSAG, [place], [place],  HEPBIGM in the last 72 hours.     IMPRESSION:  #82 84 year old white female, chronically anticoagulated with Xarelto and undergoing chemotherapy for ovarian cancer of the right ovary with documented omental carcinomatosis who presents with acute painless lower GI bleed. Patient has previously documented diverticulosis, and most recent CT imaging May 2019 does not show any evidence of bowel involvement with ovarian cancer. Suspect acute diverticular hemorrhage. Patient has been on carboplatin, and has a mild pancytopenia which is being followed.  Carboplatin also associated with increased risk of bleeding  #2 anemia secondary to above #3 history of atrial fibrillation #4 COPD #5.  Congestive heart failure EF 60% #6.  History of microscopic colitis #7.  GERD #8.  Fibromyalgia  PLAN: #1 clear liquid diet #2  bedrest, up to bedside commode with assist #3 Will transfuse 2 units of packed RBCs today, then every 6 hour hemoglobins and transfuse for hemoglobin 8 or less Xarelto is on hold, however patient did take her medication  last night so anticipate 24 to 36-hour washout #4 Will observe today, if continued active hemorrhage or decompensation will proceed to bleeding scan and IR consult for angiogram with embolization. #5 Do not plan colonoscopy at present.  We will follow with you  Amy Esterwood  03/04/2018, 9:52 AM   GI ATTENDING  History, laboratories, x-rays, prior endoscopy reports reviewed. Patient known to me. Unfortunately with advanced ovarian cancer. Being treated. Presents with acute lower GI bleeding consistent with diverticular source in the face of chronic oral anticoagulation. Agree with supportive measures as outlined above. Hopefully the bleeding will stop spontaneously. If not, recommend IR evaluation for possible embolization therapy.  [name]. Geri Seminole., M.D. [facility] Division of Gastroenterology

## 2018-03-04 NOTE — Telephone Encounter (Signed)
03/04/18  daughter left a message to go ahead and cx this appt to since mom will just be getting out of the hospital

## 2018-03-04 NOTE — Progress Notes (Signed)
[facility]ITAL[facility] PROGRESS NOTE  84 year old female ovarian CA (since 2012?)  Followed by both Dr. Alvy Bimler and Dr. Gerarda Fraction (Gyn Onc)--had double agent chemo debilitation changed to single agent carboplatin now currently not on that and has been referred for possible gynecologic surgery--> [See 02/27/2018 progress note from Dr. Gerarda Fraction for full info] Chronic diastolic heart failure, COPD, atrial fibrillation chads score >3, pancytopenia secondary to carboplatin, microscopic colitis Came to the emergency room with BRB per rectum Rest as per Dr. Hal Hope GI is seen the patient recommended conservative management  On exam Comfortable orientable 84 year old female looking about stated age Daughter is at bedside (used to be a respiratory therapist at this facility retired last year) EOMI NCAT, mild pallor, no icterus S1-S2 sinus rhythm holosystolic murmur LLSE 8/8-->TGPQDI No JVD No bruit Abdomen soft nontender no rebound no guarding no surgical scar Lower extremities are soft nontender Neurologically intact finger-nose-finger intact extraocular movements are intact mouth is slightly dry  Plan -supportive management-if large volume per rectal bleeding needs tagged RBC scan otherwise agree with conservative management as delineated by gastroenterologist--would switch to either low-dose heparin without bolus in the next 24 to 48 hours or resume Xarelto as per GI Last hemoglobin 8.1 down from 9.2 would continue checking and if below 7 transfuse and on-call is to order tagged RBC scan if this is the case  Discussed with daughter who understands clearly plan We will give also one run of potassium for hypokalemia 3.1   Verneita Griffes, MD [facility] [phone]

## 2018-03-04 NOTE — H&P (Signed)
History and Physical    [name] PRF:[id number] DOB: [birth year] DOA: 03/03/2018  PCP: Asencion Noble, MD  Patient coming from: Home.  Chief Complaint: Rectal bleeding.  HPI: [name] is a 84 y.o. female with history of ovarian carcinoma, COPD, chronic diastolic CHF, atrial fibrillation hypothyroidism, pancytopenia secondary to chemotherapy and history of microscopic colitis presents to the ER after patient had 4 episodes of rectal bleeding at home.  Patient takes Xarelto for atrial fibrillation.  Patient did take his Xarelto last night after the second episode of GI bleed.  Denies any abdominal pain nausea vomiting.  Denies any loss of consciousness or syncope.  ED Course: In the ER patient initially was mildly hypotensive and tachycardic improved with fluids.  Hemoglobin was around 9.1 dropped from 11 last month.  On exam patient is hemodynamically stable abdomen appears benign.  Anoscopy done in 2011 showed diverticulosis.  CAT scan done 2 weeks ago also showed diverticulosis.  Patient admitted for further management of rectal bleeding likely could be diverticular bleeding in the setting of anticoagulation.  Dr. Silverio Decamp on-call gastroenterology has been consulted.  Review of Systems: As per HPI, rest all negative.   Past Medical History:  Diagnosis Date  . COPD (chronic obstructive pulmonary disease) ([place])   . Depression   . Diverticulosis   . Family hx of colon cancer   . Fibromyalgia   . GERD (gastroesophageal reflux disease)   . Hemorrhoids   . Hiatal hernia   . History of rectal polyps   . History of shingles   . History of uterine polyp   . Hypothyroidism   . IBS (irritable bowel syndrome)   . Lymphocytic colitis   . Osteoporosis   . Ovarian cancer ([place])   . Paroxysmal atrial fibrillation (HCC)   . Psoriatic arthritis ([place])   . Schatzki's ring     Past Surgical History:  Procedure Laterality Date  . COLONOSCOPY    . FEMUR IM NAIL Left 12/11/2013   Procedure: INTRAMEDULLARY (IM) NAIL FEMORAL;  Surgeon: [name], MD;  Location: [facility];  Service: Orthopedics;  Laterality: Left;  . FRACTURE SURGERY Left 12/11/2013   hip  . IR FLUORO GUIDE PORT INSERTION RIGHT  07/22/2017  . IR PARACENTESIS  07/12/2017  . IR US GUIDE VASC ACCESS RIGHT  07/22/2017  . KNEE SURGERY Left    x 2  . LAPAROSCOPIC CHOLECYSTECTOMY  Dec 04, 2010  . TOTAL KNEE REVISION  08/06/2012   Procedure: TOTAL KNEE REVISION;  Surgeon: [name], MD;  Location: [facility];  Service: Orthopedics;  Laterality: Left;  Left Total Knee Arthroplasty Revision  . Uterine polypectomy       reports that she quit smoking about 37 years ago. Her smoking use included cigarettes. She has a 20.00 pack-year smoking history. She has never used smokeless tobacco. She reports that she drinks alcohol. She reports that she does not use drugs.  Allergies  Allergen Reactions  . Codeine Itching    Family History  Problem Relation Age of Onset  . Heart disease Father        pacemaker  . Lung cancer Father        hx of smoker  . Colon cancer Mother        dx in her mid 51's  . Colon cancer Maternal Grandfather   . Depression Sister     Prior to Admission medications   Medication Sig Start Date End Date Taking? Authorizing Provider  albuterol (  PROAIR HFA) 108 (90 Base) MCG/ACT inhaler Inhale 2 puffs into the lungs every 6 (six) hours as needed for wheezing or shortness of breath. 06/19/17  Yes Collene Gobble, MD  ALPRAZolam Duanne Moron) 0.25 MG tablet Take 1 tablet (0.25 mg total) by mouth at bedtime as needed for anxiety. 11/15/17  Yes Amin, Ankit Chirag, MD  azelastine (ASTELIN) 0.1 % nasal spray Place 2 sprays into both nostrils 2 (two) times daily as needed for allergies.    Yes [provider]  Cholecalciferol (VITAMIN D) 2000 units CAPS Take 2,000 Units by mouth daily.   Yes [provider]  diltiazem (CARDIZEM CD) 120 MG 24 hr capsule Take 1 capsule (120 mg total)  by mouth every evening. 02/19/18 05/20/18 Yes Satira Sark, MD  diltiazem (CARDIZEM CD) 180 MG 24 hr capsule Take 1 capsule (180 mg total) by mouth every morning. 02/19/18 05/20/18 Yes Satira Sark, MD  diphenhydrAMINE (BENADRYL) 25 mg capsule Take 25 mg by mouth every 6 (six) hours as needed for itching.   Yes [provider]  diphenoxylate-atropine (LOMOTIL) 2.5-0.025 MG tablet Take 1 tablet by mouth 4 (four) times daily. Patient taking differently: Take 1 tablet by mouth 4 (four) times daily as needed for diarrhea or loose stools.  08/21/16  Yes [name], MD  DULoxetine (CYMBALTA) 30 MG capsule TAKE 1 CAPSULE(30 MG) BY MOUTH DAILY 02/17/18  Yes Gorsuch, Ni, MD  fluticasone (FLONASE) 50 MCG/ACT nasal spray Place 2 sprays into both nostrils 2 (two) times daily. For nasal congestion 11/20/13  Yes [provider]  furosemide (LASIX) 20 MG tablet Take 1 tablet (20 mg total) by mouth daily. 11/04/17  Yes Gorsuch, Ni, MD  HYDROcodone-acetaminophen (NORCO) 10-325 MG tablet Take 1 tablet by mouth every 4 (four) hours as needed. Patient taking differently: Take 1 tablet by mouth every 4 (four) hours as needed for moderate pain.  02/04/18  Yes Gorsuch, Ni, MD  ipratropium-albuterol (DUONEB) 0.5-2.5 (3) MG/3ML SOLN Take 3 mLs by nebulization 3 (three) times daily. Patient taking differently: Take 3 mLs by nebulization every 6 (six) hours as needed (cough or SOB). As needed 11/15/17  Yes Amin, Ankit Chirag, MD  levothyroxine (SYNTHROID, LEVOTHROID) 137 MCG tablet Take 137 mcg by mouth every morning. For thyroid therapy   Yes [provider]  lidocaine-prilocaine (EMLA) cream Apply 1 application topically as needed. Patient taking differently: Apply 1 application topically as needed (port).  07/30/17  Yes Gorsuch, Ni, MD  loperamide (IMODIUM) 2 MG capsule Take 2 capsules (4 mg total) by mouth 3 (three) times daily. 07/24/17  Yes Gorsuch, Ni, MD  mirtazapine (REMERON) 15 MG  tablet Take 1 tablet (15 mg total) by mouth at bedtime. 11/04/17  Yes Gorsuch, Ni, MD  morphine (MS CONTIN) 15 MG 12 hr tablet Take 1 tablet (15 mg total) by mouth every 12 (twelve) hours. 02/04/18  Yes Gorsuch, Ni, MD  polyvinyl alcohol (LIQUIFILM TEARS) 1.4 % ophthalmic solution 1 drop 2 (two) times daily as needed for dry eyes.   Yes [provider]  potassium chloride (K-DUR) 10 MEQ tablet Take 10 mEq by mouth daily.   Yes [provider]  Tiotropium Bromide-Olodaterol (STIOLTO RESPIMAT) 2.5-2.5 MCG/ACT AERS Inhale 2 puffs into the lungs daily. 01/21/18  Yes Collene Gobble, MD  XARELTO 20 MG TABS tablet TAKE 1 TABLET BY MOUTH EVERY DAY WITH SUPPER 04/23/17  Yes Satira Sark, MD  Calcium Carbonate (CALCIUM 600) 1500 MG TABS Take 2 tablets by  mouth daily.    12/18/11  [provider]    Physical Exam: Vitals:   03/04/18 0335 03/04/18 0340 03/04/18 0345 03/04/18 0401  BP:  (!) 91/52  117/61  Pulse: 100 97 (!) 108 (!) 116  Resp:    18  Temp:    98 F (36.7 C)  TempSrc:    Oral  SpO2: (!) 87% (!) 87% 98% 97%  Weight:      Height:          Constitutional: Moderately built and nourished. Vitals:   03/04/18 0335 03/04/18 0340 03/04/18 0345 03/04/18 0401  BP:  (!) 91/52  117/61  Pulse: 100 97 (!) 108 (!) 116  Resp:    18  Temp:    98 F (36.7 C)  TempSrc:    Oral  SpO2: (!) 87% (!) 87% 98% 97%  Weight:      Height:       Eyes: Anicteric no pallor. ENMT: No discharge from the ears eyes nose or mouth. Neck: No mass felt.  No neck rigidity.  No JVD appreciated. Respiratory: No rhonchi or crepitations. Cardiovascular: S1-S2 heard no murmurs appreciated. Abdomen: Soft nontender bowel sounds present. Musculoskeletal: No edema.  No joint effusion. Skin: No rash.  Skin appears warm. Neurologic: Alert awake oriented to time place and person.  Moves all extremities 5 x 5. Psychiatric: Appears normal per normal affect.   Labs on Admission: I have  personally reviewed following labs and imaging studies  CBC: Recent Labs  Lab 03/04/18 0048  WBC 7.0  HGB 9.2*  HCT 27.2*  MCV 97.5  PLT 99*   Basic Metabolic Panel: Recent Labs  Lab 03/04/18 0048  NA 139  K 3.4*  CL 105  CO2 24  GLUCOSE 120*  BUN 19  CREATININE 0.83  CALCIUM 8.0*   GFR: Estimated Creatinine Clearance: 51.3 mL/min (by C-G formula based on SCr of 0.83 mg/dL). Liver Function Tests: Recent Labs  Lab 03/04/18 0048  AST 13*  ALT 11*  ALKPHOS 44  BILITOT 0.4  PROT 6.4*  ALBUMIN 3.2*   No results for input(s): LIPASE, AMYLASE in the last 168 hours. No results for input(s): AMMONIA in the last 168 hours. Coagulation Profile: No results for input(s): INR, PROTIME in the last 168 hours. Cardiac Enzymes: No results for input(s): CKTOTAL, CKMB, CKMBINDEX, TROPONINI in the last 168 hours. BNP (last 3 results) Recent Labs    08/12/17 1137  PROBNP 837*   HbA1C: No results for input(s): HGBA1C in the last 72 hours. CBG: No results for input(s): GLUCAP in the last 168 hours. Lipid Profile: No results for input(s): CHOL, HDL, LDLCALC, TRIG, CHOLHDL, LDLDIRECT in the last 72 hours. Thyroid Function Tests: No results for input(s): TSH, T4TOTAL, FREET4, T3FREE, THYROIDAB in the last 72 hours. Anemia Panel: No results for input(s): VITAMINB12, FOLATE, FERRITIN, TIBC, IRON, RETICCTPCT in the last 72 hours. Urine analysis:    Component Value Date/Time   COLORURINE YELLOW 06/30/2017 1235   APPEARANCEUR HAZY (A) 06/30/2017 1235   LABSPEC 1.030 10/11/2017 1125   PHURINE 6.0 10/11/2017 1125   PHURINE 6.0 06/30/2017 1235   GLUCOSEU Negative 10/11/2017 1125   HGBUR Small 10/11/2017 1125   HGBUR MODERATE (A) 06/30/2017 1235   BILIRUBINUR Negative 10/11/2017 1125   KETONESUR Negative 10/11/2017 1125   KETONESUR 40 (A) 06/30/2017 1235   PROTEINUR 30 10/11/2017 1125   PROTEINUR 100 (A) 06/30/2017 1235   UROBILINOGEN 0.2 10/11/2017 1125   NITRITE Negative  10/11/2017 1125  NITRITE NEGATIVE 06/30/2017 1235   LEUKOCYTESUR Negative 10/11/2017 1125   Sepsis Labs: @LABRCNTIP (procalcitonin:4,lacticidven:4) )No results found for this or any previous visit (from the past 240 hour(s)).   Radiological Exams on Admission: No results found.   Assessment/Plan Principal Problem:   Rectal bleeding Active Problems:   Hypothyroidism   COPD (chronic obstructive pulmonary disease) (HCC)   Atrial fibrillation (HCC)   PAD (peripheral artery disease) (HCC)   Ovarian CA, right (HCC)   Protein-calorie malnutrition, moderate (HCC)   Pancytopenia, acquired (HCC)   Pleural effusion   Anemia, chronic disease   Peripheral neuropathy due to chemotherapy (HCC)   Thrombocytopenia (HCC)   Acute GI bleeding    1. Rectal bleeding -suspect diverticular in origin.  Patient had already taken her Xarelto last night.  We will closely monitor CBC and will keep patient n.p.o. except medication in anticipation of possible procedure.  Continue with gentle hydration. 2. Acute blood loss anemia -follow CBC. 3. Thrombocytopenia likely from chemotherapy -follow CBC. 4. History of paroxysmal atrial fibrillation -holding Xarelto due to acute GI bleed patient agreeable.  On Cardizem.  Closely monitor blood pressure. 5. History of recurrent pleural effusion has required thoracentesis previously.  Presently not hypoxic. 6. History of microscopic colitis previously. 7. Hypothyroidism on Synthroid. 8. COPD not actively wheezing.   DVT prophylaxis: SCDs. Code Status: Full code. Family Communication: Discussed with patient. Disposition Plan: Home. Consults called: Gastroenterologist Dr. Silverio Decamp. Admission status: Observation.   Rise Patience MD [facility] Pager [phone].  If 7PM-7AM, please contact night-coverage [email or web address] Password [email or web address]  03/04/2018, 5:27 AM

## 2018-03-04 NOTE — Telephone Encounter (Signed)
03/04/18  daughter called to cx said that patient was admitted to the hospital last night

## 2018-03-05 ENCOUNTER — Ambulatory Visit (HOSPITAL_COMMUNITY): Admission: RE | Admit: 2018-03-05 | Payer: Medicare Other | Source: Ambulatory Visit

## 2018-03-05 ENCOUNTER — Encounter (HOSPITAL_COMMUNITY): Payer: Self-pay | Admitting: Physical Therapy

## 2018-03-05 DIAGNOSIS — I4891 Unspecified atrial fibrillation: Secondary | ICD-10-CM | POA: Diagnosis not present

## 2018-03-05 DIAGNOSIS — D689 Coagulation defect, unspecified: Secondary | ICD-10-CM | POA: Diagnosis not present

## 2018-03-05 DIAGNOSIS — K922 Gastrointestinal hemorrhage, unspecified: Secondary | ICD-10-CM | POA: Diagnosis not present

## 2018-03-05 DIAGNOSIS — I482 Chronic atrial fibrillation: Secondary | ICD-10-CM | POA: Diagnosis not present

## 2018-03-05 DIAGNOSIS — I739 Peripheral vascular disease, unspecified: Secondary | ICD-10-CM | POA: Diagnosis present

## 2018-03-05 DIAGNOSIS — C561 Malignant neoplasm of right ovary: Secondary | ICD-10-CM | POA: Diagnosis present

## 2018-03-05 DIAGNOSIS — K625 Hemorrhage of anus and rectum: Secondary | ICD-10-CM

## 2018-03-05 DIAGNOSIS — I480 Paroxysmal atrial fibrillation: Secondary | ICD-10-CM

## 2018-03-05 DIAGNOSIS — D638 Anemia in other chronic diseases classified elsewhere: Secondary | ICD-10-CM | POA: Diagnosis present

## 2018-03-05 DIAGNOSIS — D6959 Other secondary thrombocytopenia: Secondary | ICD-10-CM | POA: Diagnosis present

## 2018-03-05 DIAGNOSIS — Z8719 Personal history of other diseases of the digestive system: Secondary | ICD-10-CM | POA: Diagnosis not present

## 2018-03-05 DIAGNOSIS — J431 Panlobular emphysema: Secondary | ICD-10-CM

## 2018-03-05 DIAGNOSIS — E876 Hypokalemia: Secondary | ICD-10-CM | POA: Diagnosis not present

## 2018-03-05 DIAGNOSIS — K5731 Diverticulosis of large intestine without perforation or abscess with bleeding: Secondary | ICD-10-CM | POA: Diagnosis not present

## 2018-03-05 DIAGNOSIS — E039 Hypothyroidism, unspecified: Secondary | ICD-10-CM

## 2018-03-05 DIAGNOSIS — Z7901 Long term (current) use of anticoagulants: Secondary | ICD-10-CM | POA: Diagnosis not present

## 2018-03-05 DIAGNOSIS — D696 Thrombocytopenia, unspecified: Secondary | ICD-10-CM | POA: Diagnosis not present

## 2018-03-05 DIAGNOSIS — D61810 Antineoplastic chemotherapy induced pancytopenia: Secondary | ICD-10-CM | POA: Diagnosis present

## 2018-03-05 DIAGNOSIS — T451X5A Adverse effect of antineoplastic and immunosuppressive drugs, initial encounter: Secondary | ICD-10-CM | POA: Diagnosis present

## 2018-03-05 DIAGNOSIS — M810 Age-related osteoporosis without current pathological fracture: Secondary | ICD-10-CM | POA: Diagnosis present

## 2018-03-05 DIAGNOSIS — C786 Secondary malignant neoplasm of retroperitoneum and peritoneum: Secondary | ICD-10-CM | POA: Diagnosis present

## 2018-03-05 DIAGNOSIS — J90 Pleural effusion, not elsewhere classified: Secondary | ICD-10-CM | POA: Diagnosis not present

## 2018-03-05 DIAGNOSIS — G620 Drug-induced polyneuropathy: Secondary | ICD-10-CM | POA: Diagnosis present

## 2018-03-05 DIAGNOSIS — E440 Moderate protein-calorie malnutrition: Secondary | ICD-10-CM | POA: Diagnosis present

## 2018-03-05 DIAGNOSIS — J449 Chronic obstructive pulmonary disease, unspecified: Secondary | ICD-10-CM | POA: Diagnosis present

## 2018-03-05 DIAGNOSIS — D62 Acute posthemorrhagic anemia: Secondary | ICD-10-CM

## 2018-03-05 DIAGNOSIS — M797 Fibromyalgia: Secondary | ICD-10-CM | POA: Diagnosis present

## 2018-03-05 DIAGNOSIS — K219 Gastro-esophageal reflux disease without esophagitis: Secondary | ICD-10-CM | POA: Diagnosis present

## 2018-03-05 DIAGNOSIS — L4050 Arthropathic psoriasis, unspecified: Secondary | ICD-10-CM | POA: Diagnosis present

## 2018-03-05 DIAGNOSIS — I5032 Chronic diastolic (congestive) heart failure: Secondary | ICD-10-CM | POA: Diagnosis present

## 2018-03-05 DIAGNOSIS — I959 Hypotension, unspecified: Secondary | ICD-10-CM | POA: Diagnosis present

## 2018-03-05 LAB — BPAM RBC
BLOOD PRODUCT EXPIRATION DATE: 201906232359
Blood Product Expiration Date: 201906232359
ISSUE DATE / TIME: 201905281137
ISSUE DATE / TIME: 201905281518
UNIT TYPE AND RH: 5100
Unit Type and Rh: 5100

## 2018-03-05 LAB — TYPE AND SCREEN
ABO/RH(D): O POS
ANTIBODY SCREEN: NEGATIVE
UNIT DIVISION: 0
Unit division: 0

## 2018-03-05 LAB — CBC
HEMATOCRIT: 27.4 % — AB (ref 36.0–46.0)
Hemoglobin: 9.2 g/dL — ABNORMAL LOW (ref 12.0–15.0)
MCH: 32.1 pg (ref 26.0–34.0)
MCHC: 33.6 g/dL (ref 30.0–36.0)
MCV: 95.5 fL (ref 78.0–100.0)
Platelets: 85 10*3/uL — ABNORMAL LOW (ref 150–400)
RBC: 2.87 MIL/uL — AB (ref 3.87–5.11)
RDW: 15 % (ref 11.5–15.5)
WBC: 4.9 10*3/uL (ref 4.0–10.5)

## 2018-03-05 LAB — BASIC METABOLIC PANEL
Anion gap: 7 (ref 5–15)
BUN: 7 mg/dL (ref 6–20)
CO2: 23 mmol/L (ref 22–32)
Calcium: 7.3 mg/dL — ABNORMAL LOW (ref 8.9–10.3)
Chloride: 109 mmol/L (ref 101–111)
Creatinine, Ser: 0.69 mg/dL (ref 0.44–1.00)
GFR calc Af Amer: >60 mL/min (ref >60)
Glucose, Bld: 102 mg/dL — ABNORMAL HIGH (ref 65–99)
POTASSIUM: 3.9 mmol/L (ref 3.5–5.1)
SODIUM: 139 mmol/L (ref 135–145)

## 2018-03-05 LAB — HEMOGLOBIN AND HEMATOCRIT, BLOOD
HCT: 29.2 % — ABNORMAL LOW (ref 36.0–46.0)
Hemoglobin: 9.9 g/dL — ABNORMAL LOW (ref 12.0–15.0)

## 2018-03-05 LAB — GLUCOSE, CAPILLARY
GLUCOSE-CAPILLARY: 114 mg/dL — AB (ref 65–99)
Glucose-Capillary: 100 mg/dL — ABNORMAL HIGH (ref 65–99)

## 2018-03-05 LAB — MAGNESIUM: MAGNESIUM: 1.4 mg/dL — AB (ref 1.7–2.4)

## 2018-03-05 MED ORDER — UMECLIDINIUM BROMIDE 62.5 MCG/INH IN AEPB
1.0000 | INHALATION_SPRAY | Freq: Every day | RESPIRATORY_TRACT | Status: DC
  Administered 2018-03-06 – 2018-03-07 (×2): 1 via RESPIRATORY_TRACT
  Filled 2018-03-05: qty 7

## 2018-03-05 MED ORDER — SODIUM CHLORIDE 0.9 % IV SOLN
INTRAVENOUS | Status: AC
  Administered 2018-03-05 – 2018-03-06 (×2): via INTRAVENOUS

## 2018-03-05 MED ORDER — MAGNESIUM SULFATE 4 GM/100ML IV SOLN
4.0000 g | Freq: Once | INTRAVENOUS | Status: AC
  Administered 2018-03-05: 4 g via INTRAVENOUS
  Filled 2018-03-05: qty 100

## 2018-03-05 MED ORDER — ARFORMOTEROL TARTRATE 15 MCG/2ML IN NEBU
15.0000 ug | INHALATION_SOLUTION | Freq: Two times a day (BID) | RESPIRATORY_TRACT | Status: DC
  Administered 2018-03-05 – 2018-03-07 (×4): 15 ug via RESPIRATORY_TRACT
  Filled 2018-03-05 (×4): qty 2

## 2018-03-05 NOTE — Plan of Care (Signed)
Pt is NPO and is still experiencing bleeding from her rectum

## 2018-03-05 NOTE — Progress Notes (Signed)
Explained Obs status to pt this am however pt was changed to IP. Pt is aware.

## 2018-03-05 NOTE — Progress Notes (Addendum)
Daily Rounding Note  03/05/2018, 10:38 AM  LOS: 0 days   SUBJECTIVE:   1 bloody stool this AM, another about 3.5 hours later.  No abd pain.  Asking if she can have something more to eat, even just mashed potatoes.      Not dizzy or weak.    OBJECTIVE:         Vital signs in last 24 hours:    Temp:  [98 F (36.7 C)-98.6 F (37 C)] 98 F (36.7 C) (05/29 0522) Pulse Rate:  [66-96] 84 (05/29 0522) Resp:  [14-16] 16 (05/29 0522) BP: (104-132)/(45-91) 121/48 (05/29 0522) SpO2:  [97 %-100 %] 97 % (05/29 0522) Last BM Date: 03/04/18 Filed Weights   03/03/18 2308 03/04/18 0538  Weight: 162 lb (73.5 kg) 165 lb 12.6 oz (75.2 kg)   General: looks well   Heart: RRR Chest: diminished BS on left base, O/w clear Abdomen: soft, NT, ND.  Active BS  Extremities: no CCE Neuro/Psych:  Oriented x 3.  No deficits.    Intake/Output from previous day: 05/28 0701 - 05/29 0700 In: 1325 [P.O.:600; I.V.:40; Blood:635; IV Piggyback:50] Out: -   Intake/Output this shift: No intake/output data recorded.  Lab Results: Recent Labs    03/04/18 0607 03/04/18 2324 03/05/18 0352  WBC 5.5 5.4 4.9  HGB 8.1* 9.5* 9.2*  HCT 24.3* 27.9* 27.4*  PLT 91* 84* 85*   BMET Recent Labs    03/04/18 0048 03/04/18 0607  NA 139 140  K 3.4* 3.1*  CL 105 108  CO2 24 23  GLUCOSE 120* 123*  BUN 19 16  CREATININE 0.83 0.76  CALCIUM 8.0* 7.5*   LFT Recent Labs    03/04/18 0048 03/04/18 0607  PROT 6.4* 5.6*  ALBUMIN 3.2* 2.8*  AST 13* 14*  ALT 11* 12*  ALKPHOS 44 37*  BILITOT 0.4 0.5   Scheduled Meds: . diltiazem  120 mg Oral QPM  . diltiazem  180 mg Oral Daily  . DULoxetine  30 mg Oral Daily  . levothyroxine  137 mcg Oral q morning - 10a  . mirtazapine  15 mg Oral QHS  . morphine  15 mg Oral Q12H   Continuous Infusions: . sodium chloride 75 mL/hr at 03/05/18 0936   PRN Meds:.acetaminophen **OR** acetaminophen, albuterol,  ALPRAZolam, ipratropium-albuterol, ondansetron **OR** ondansetron (ZOFRAN) IV, sodium chloride flush  ASSESMENT:   *  Painless LGIB.  Suspect diverticular bleed.   Hx microscopic colitis and diverticulosis on previous colonoscopy.    *  Chronic xarelto for a fib, on hold   *  Ovarian cancer, undergoing chemo.  Omental carcinomatosis stable to slightly decreased per CT of 02/25/18.    *  Decreasing pancreatic cystic lesion per CT, likely benign.    *  Pancytopenia due to carboplatin.   ABL anemia.  S/p 2 U PRBCs on 5/28.  Hgb improved and stable.   Platelets stable.    *   Hypokalemia.      PLAN   *  Full liquid diet.    *  ? Pursue nuclear RBC scan?  Will d/w Dr Henrene Pastor.    *  corrrect potassium per attending MD.    Azucena Freed  03/05/2018, 10:39 AM Phone [phone]  GI ATTENDING  Interval history and data reviewed. Patient personally seen and examined. Daughter in room. Still with some bleeding, though less. Hemoglobin stable. No other complaints. Xarelto washing out. Felt to have diverticular  bleed. Hopefully will stop (or has stopped) spontaneously. No plans for RBC scan at this time but could consider for significant recurrent gross bleeding and if positive proceed to IR. Continue to monitor hemoglobin and stools. Transfuse as needed. Discussed with family and Dr. Grandville Silos.will follow  [name]. Geri Seminole., M.D. [facility] Division of Gastroenterology

## 2018-03-05 NOTE — Progress Notes (Signed)
PROGRESS NOTE    [name]  XBD:[id number] DOB: 08-20-1933 DOA: 03/03/2018 PCP: Asencion Noble, MD    Brief Narrative:  Patient is a 84 year old female history of ovarian carcinoma, COPD, chronic diastolic CHF, A. fib, hypothyroidism, pancytopenia secondary to chemotherapy history of microscopic colitis presented to the ED with rectal bleeding.  Patient on Xarelto for her anticoagulation.  Currently on Xarelto.  On presentation to the ED patient was noted to be mildly hypotensive and tachycardic which improved with IV fluids.  Hemoglobin was 9.1 from 11.  Gastroenterology consulted and are following.   Assessment & Plan:   Principal Problem:   Acute GI bleeding Active Problems:   Hypothyroidism   COPD (chronic obstructive pulmonary disease) (HCC)   Atrial fibrillation (HCC)   PAD (peripheral artery disease) (HCC)   Ovarian CA, right (HCC)   Protein-calorie malnutrition, moderate (HCC)   Pancytopenia, acquired (HCC)   Pleural effusion   Anemia, chronic disease   Peripheral neuropathy due to chemotherapy (HCC)   Thrombocytopenia (HCC)   Rectal bleeding  1 acute GI bleed likely lower GI bleed secondary to diverticular bleed/rectal bleeding Patient with ongoing rectal bleeding had a few episodes overnight.  Patient states 2 episodes this morning of maroon-colored stools with clots noted.  Hemoglobin this morning 9.2 from 9.5 last night.  Check a H&H this afternoon.  Diet has been advanced to full liquid diet by GI.  If worsening bleeding and significant drop in hemoglobin may need a tagged red blood cell scan and angiography with embolectomy per IR.  Contiue to hold Xarelto.  Transfuse for hemoglobin less than 8.  GI following.  2.  Acute blood loss anemia Secondary to problem #1.  Check an anemia panel.  Serial H&H.  Transfuse for hemoglobin less than 8.  3.  Hypothyroidism Continue home dose Synthroid.  4.  Atrial fibrillation CHA2DS2VASC > 3 Continue Cardizem for rate  control.  Anticoagulation on hold secondary to problem #1.  GI to advice when anticoagulation may be resumed.  5.  COPD Stable.  Placed on [place].  Duo nebs as needed.  6.  Thrombocytopenia Secondary to chemotherapy.  Follow.  7.  History of recurrent pleural effusion Currently stable, outpatient follow-up with pulmonary who will reassess, and was set up for thoracentesis if needed.  8.  History of microscopic colitis Per GI.  9.  Hypokalemia/hypomagnesemia Replete.    DVT prophylaxis: SCDs Code Status: Full Family Communication: Updated patient and daughter at bedside. Disposition Plan: Likely home once GI bleed has resolved with no further bleeding and stable hemoglobin and per gastroenterology.   Consultants:   Gastroenterology: Dr. Henrene Pastor 03/04/2018  Procedures:   None  Antimicrobials:   None   Subjective: Patient c/o bloody BM x 2 this morning and 2-3 episodes of marron colored stools last night. No SOB. No CP.  No abdominal pain.  Tolerating full liquids.  Objective: Vitals:   03/04/18 1754 03/04/18 2119 03/05/18 0522 03/05/18 1315  BP: (!) 116/47 (!) 132/59 (!) 121/48 (!) 136/95  Pulse: 79 89 84 (!) 102  Resp: 16 16 16 18   Temp: 98.1 F (36.7 C) 98.3 F (36.8 C) 98 F (36.7 C) 98.3 F (36.8 C)  TempSrc: Oral Oral Oral Oral  SpO2: 97% 98% 97% 98%  Weight:      Height:        Intake/Output Summary (Last 24 hours) at 03/05/2018 1624 Last data filed at 03/05/2018 1526 Gross per 24 hour  Intake 1747.5 ml  Output -  Net 1747.5 ml   Filed Weights   03/03/18 2308 03/04/18 0538  Weight: 73.5 kg (162 lb) 75.2 kg (165 lb 12.6 oz)    Examination:  General exam: Appears calm and comfortable  Respiratory system: Minimal expiratory wheezing.  No rhonchi.  No crackles.  Normal respiratory effort.  Speaking in full sentences.   Cardiovascular system: S1 & S2 heard, RRR. No JVD, murmurs, rubs, gallops or clicks. No pedal edema. Gastrointestinal  system: Abdomen is nondistended, soft and nontender. No organomegaly or masses felt. Normal bowel sounds heard. Central nervous system: Alert and oriented. No focal neurological deficits. Extremities: Symmetric 5 x 5 power. Skin: No rashes, lesions or ulcers Psychiatry: Judgement and insight appear normal. Mood & affect appropriate.     Data Reviewed: I have personally reviewed following labs and imaging studies  CBC: Recent Labs  Lab 03/04/18 0048 03/04/18 0607 03/04/18 2324 03/05/18 0352 03/05/18 1500  WBC 7.0 5.5 5.4 4.9  --   HGB 9.2* 8.1* 9.5* 9.2* 9.9*  HCT 27.2* 24.3* 27.9* 27.4* 29.2*  MCV 97.5 97.6 94.9 95.5  --   PLT 99* 91* 84* 85*  --    Basic Metabolic Panel: Recent Labs  Lab 03/04/18 0048 03/04/18 0607 03/05/18 1005  NA 139 140 139  K 3.4* 3.1* 3.9  CL 105 108 109  CO2 24 23 23   GLUCOSE 120* 123* 102*  BUN 19 16 7   CREATININE 0.83 0.76 0.69  CALCIUM 8.0* 7.5* 7.3*  MG  --   --  1.4*   GFR: Estimated Creatinine Clearance: 53.8 mL/min (by C-G formula based on SCr of 0.69 mg/dL). Liver Function Tests: Recent Labs  Lab 03/04/18 0048 03/04/18 0607  AST 13* 14*  ALT 11* 12*  ALKPHOS 44 37*  BILITOT 0.4 0.5  PROT 6.4* 5.6*  ALBUMIN 3.2* 2.8*   No results for input(s): LIPASE, AMYLASE in the last 168 hours. No results for input(s): AMMONIA in the last 168 hours. Coagulation Profile: No results for input(s): INR, PROTIME in the last 168 hours. Cardiac Enzymes: No results for input(s): CKTOTAL, CKMB, CKMBINDEX, TROPONINI in the last 168 hours. BNP (last 3 results) Recent Labs    08/12/17 1137  PROBNP 837*   HbA1C: No results for input(s): HGBA1C in the last 72 hours. CBG: Recent Labs  Lab 03/05/18 0234 03/05/18 0813  GLUCAP 100* 114*   Lipid Profile: No results for input(s): CHOL, HDL, LDLCALC, TRIG, CHOLHDL, LDLDIRECT in the last 72 hours. Thyroid Function Tests: No results for input(s): TSH, T4TOTAL, FREET4, T3FREE, THYROIDAB in the  last 72 hours. Anemia Panel: No results for input(s): VITAMINB12, FOLATE, FERRITIN, TIBC, IRON, RETICCTPCT in the last 72 hours. Sepsis Labs: No results for input(s): PROCALCITON, LATICACIDVEN in the last 168 hours.  No results found for this or any previous visit (from the past 240 hour(s)).       Radiology Studies: No results found.      Scheduled Meds: . arformoterol  15 mcg Nebulization BID  . diltiazem  120 mg Oral QPM  . diltiazem  180 mg Oral Daily  . DULoxetine  30 mg Oral Daily  . levothyroxine  137 mcg Oral q morning - 10a  . mirtazapine  15 mg Oral QHS  . morphine  15 mg Oral Q12H  . umeclidinium bromide  1 puff Inhalation Daily   Continuous Infusions: . sodium chloride 75 mL/hr at 03/05/18 0936     LOS: 0 days    Time spent: 35  minutes    Irine Seal, MD [facility] Pager [phone]  If 7PM-7AM, please contact night-coverage [email or web address] Password [facility] 03/05/2018, 4:24 PM

## 2018-03-06 ENCOUNTER — Inpatient Hospital Stay (HOSPITAL_COMMUNITY): Payer: Medicare Other

## 2018-03-06 DIAGNOSIS — Z7901 Long term (current) use of anticoagulants: Secondary | ICD-10-CM

## 2018-03-06 DIAGNOSIS — K5731 Diverticulosis of large intestine without perforation or abscess with bleeding: Principal | ICD-10-CM

## 2018-03-06 DIAGNOSIS — I4891 Unspecified atrial fibrillation: Secondary | ICD-10-CM

## 2018-03-06 LAB — HEMOGLOBIN AND HEMATOCRIT, BLOOD
HCT: 24.3 % — ABNORMAL LOW (ref 36.0–46.0)
HCT: 25.1 % — ABNORMAL LOW (ref 36.0–46.0)
HEMOGLOBIN: 8.3 g/dL — AB (ref 12.0–15.0)
Hemoglobin: 8.3 g/dL — ABNORMAL LOW (ref 12.0–15.0)

## 2018-03-06 LAB — BASIC METABOLIC PANEL
Anion gap: 5 (ref 5–15)
BUN: 6 mg/dL (ref 6–20)
CHLORIDE: 111 mmol/L (ref 101–111)
CO2: 25 mmol/L (ref 22–32)
CREATININE: 0.62 mg/dL (ref 0.44–1.00)
Calcium: 7.7 mg/dL — ABNORMAL LOW (ref 8.9–10.3)
GFR calc Af Amer: >60 mL/min (ref >60)
Glucose, Bld: 105 mg/dL — ABNORMAL HIGH (ref 65–99)
Potassium: 3.4 mmol/L — ABNORMAL LOW (ref 3.5–5.1)
SODIUM: 141 mmol/L (ref 135–145)

## 2018-03-06 LAB — MAGNESIUM: MAGNESIUM: 2.2 mg/dL (ref 1.7–2.4)

## 2018-03-06 LAB — GLUCOSE, CAPILLARY: Glucose-Capillary: 111 mg/dL — ABNORMAL HIGH (ref 65–99)

## 2018-03-06 MED ORDER — FUROSEMIDE 20 MG PO TABS
20.0000 mg | ORAL_TABLET | Freq: Every day | ORAL | Status: DC
  Administered 2018-03-06 – 2018-03-07 (×2): 20 mg via ORAL
  Filled 2018-03-06 (×2): qty 1

## 2018-03-06 MED ORDER — POTASSIUM CHLORIDE CRYS ER 20 MEQ PO TBCR
40.0000 meq | EXTENDED_RELEASE_TABLET | Freq: Once | ORAL | Status: AC
  Administered 2018-03-06: 40 meq via ORAL
  Filled 2018-03-06: qty 2

## 2018-03-06 NOTE — Progress Notes (Signed)
PROGRESS NOTE    MIGUELINA FORE  PNP:[id number] DOB: Feb 21, 1933 DOA: 03/03/2018 PCP: Asencion Noble, MD    Brief Narrative:  Patient is a 84 year old female history of ovarian carcinoma, COPD, chronic diastolic CHF, A. fib, hypothyroidism, pancytopenia secondary to chemotherapy history of microscopic colitis presented to the ED with rectal bleeding.  Patient on Xarelto for her anticoagulation.  Currently on Xarelto.  On presentation to the ED patient was noted to be mildly hypotensive and tachycardic which improved with IV fluids.  Hemoglobin was 9.1 from 11.  Gastroenterology consulted and are following.   Assessment & Plan:   Principal Problem:   Acute GI bleeding Active Problems:   Hypothyroidism   COPD (chronic obstructive pulmonary disease) (HCC)   Atrial fibrillation (HCC)   PAD (peripheral artery disease) (HCC)   Ovarian CA, right (HCC)   Protein-calorie malnutrition, moderate (HCC)   Pancytopenia, acquired (HCC)   Pleural effusion   Anemia, chronic disease   Peripheral neuropathy due to chemotherapy (HCC)   Thrombocytopenia (HCC)   Rectal bleeding  1 acute GI bleed likely lower GI bleed secondary to diverticular bleed/rectal bleeding Patient presented with rectal bleeding.  Patient states had brown bowel movement this morning.  Hemoglobin currently at 8.3 from 9.9 last night. Diet has been advanced to a soft diet by GI.  Xarelto on hold.  Transfusion threshold hemoglobin less than 8.  Status post 2 units packed red blood cells 03/04/2018.  Follow H&H.  Per GI.   2.  Acute blood loss anemia Secondary to problem #1.  Hemoglobin currently stable at 8.3. Transfuse for hemoglobin less than 8.  3.  Hypothyroidism Continue Synthroid.   4.  Atrial fibrillation CHA2DS2VASC > 3 Continue Cardizem for rate control.  Anticoagulation on hold secondary to problem #1.  Per GI to defer to PCP or cardiology to assess patient's risk for stroke to determine best timing for resumption of  anticoagulation.   5.  COPD Stable.  Continue Brovana and Ellipta.  Duo nebs as needed.   6.  Thrombocytopenia Secondary to chemotherapy.  Outpatient follow-up.    7.  History of recurrent pleural effusion Currently stable, outpatient follow-up with pulmonary who will reassess, and was set up for thoracentesis if needed.  8.  History of microscopic colitis Per GI.  9.  Hypokalemia/hypomagnesemia Replete.    DVT prophylaxis: SCDs Code Status: Full Family Communication: Updated patient and daughter at bedside. Disposition Plan: Likely home once GI bleed has resolved with no further bleeding and stable hemoglobin and per gastroenterology hopefully in the next 24 hours..   Consultants:   Gastroenterology: Dr. Henrene Pastor 03/04/2018  Procedures:   Transfuse 2 units packed red blood cells 03/04/2018.  Antimicrobials:   None   Subjective: Patient states had normal brown bowel movement today.  Patient feels bleeding has subsided.  Denies any chest pain or shortness of breath.  Diet has been advanced.   Objective: Vitals:   03/05/18 1932 03/05/18 2055 03/06/18 0612 03/06/18 1132  BP:  (!) 137/59 (!) 134/54   Pulse:  (!) 102 (!) 103 87  Resp:  18 18 17   Temp:  98.5 F (36.9 C) 97.9 F (36.6 C)   TempSrc:  Oral Oral   SpO2: 96% 99% 100% 99%  Weight:      Height:        Intake/Output Summary (Last 24 hours) at 03/06/2018 1143 Last data filed at 03/06/2018 1015 Gross per 24 hour  Intake 1712.5 ml  Output -  Net 1712.5 ml  Filed Weights   03/03/18 2308 03/04/18 0538  Weight: 73.5 kg (162 lb) 75.2 kg (165 lb 12.6 oz)    Examination:  General exam: NAD Respiratory system: Lungs clear to auscultation bilaterally.  No wheezes, no crackles, no rhonchi.  Normal respiratory effort.    Cardiovascular system: Regular rate and rhythm no murmurs rubs or gallops.  No JVD.  No lower extremity edema. Gastrointestinal system: Abdomen is soft, nontender, nondistended, positive bowel  sounds.  No rebound.  No guarding.   Central nervous system: Alert and oriented. No focal neurological deficits. Extremities: Symmetric 5 x 5 power. Skin: No rashes, lesions or ulcers Psychiatry: Judgement and insight appear normal. Mood & affect appropriate.     Data Reviewed: I have personally reviewed following labs and imaging studies  CBC: Recent Labs  Lab 03/04/18 0048 03/04/18 0607 03/04/18 2324 03/05/18 0352 03/05/18 1500 03/06/18 0420  WBC 7.0 5.5 5.4 4.9  --   --   HGB 9.2* 8.1* 9.5* 9.2* 9.9* 8.3*  HCT 27.2* 24.3* 27.9* 27.4* 29.2* 24.3*  MCV 97.5 97.6 94.9 95.5  --   --   PLT 99* 91* 84* 85*  --   --    Basic Metabolic Panel: Recent Labs  Lab 03/04/18 0048 03/04/18 0607 03/05/18 1005 03/06/18 0420  NA 139 140 139 141  K 3.4* 3.1* 3.9 3.4*  CL 105 108 109 111  CO2 24 23 23 25   GLUCOSE 120* 123* 102* 105*  BUN 19 16 7 6   CREATININE 0.83 0.76 0.69 0.62  CALCIUM 8.0* 7.5* 7.3* 7.7*  MG  --   --  1.4* 2.2   GFR: Estimated Creatinine Clearance: 53.8 mL/min (by C-G formula based on SCr of 0.62 mg/dL). Liver Function Tests: Recent Labs  Lab 03/04/18 0048 03/04/18 0607  AST 13* 14*  ALT 11* 12*  ALKPHOS 44 37*  BILITOT 0.4 0.5  PROT 6.4* 5.6*  ALBUMIN 3.2* 2.8*   No results for input(s): LIPASE, AMYLASE in the last 168 hours. No results for input(s): AMMONIA in the last 168 hours. Coagulation Profile: No results for input(s): INR, PROTIME in the last 168 hours. Cardiac Enzymes: No results for input(s): CKTOTAL, CKMB, CKMBINDEX, TROPONINI in the last 168 hours. BNP (last 3 results) Recent Labs    08/12/17 1137  PROBNP 837*   HbA1C: No results for input(s): HGBA1C in the last 72 hours. CBG: Recent Labs  Lab 03/05/18 0234 03/05/18 0813 03/06/18 0848  GLUCAP 100* 114* 111*   Lipid Profile: No results for input(s): CHOL, HDL, LDLCALC, TRIG, CHOLHDL, LDLDIRECT in the last 72 hours. Thyroid Function Tests: No results for input(s): TSH,  T4TOTAL, FREET4, T3FREE, THYROIDAB in the last 72 hours. Anemia Panel: No results for input(s): VITAMINB12, FOLATE, FERRITIN, TIBC, IRON, RETICCTPCT in the last 72 hours. Sepsis Labs: No results for input(s): PROCALCITON, LATICACIDVEN in the last 168 hours.  No results found for this or any previous visit (from the past 240 hour(s)).       Radiology Studies: Dg Chest 2 View  Result Date: 03/06/2018 CLINICAL DATA:  Hypoxia EXAM: CHEST - 2 VIEW COMPARISON:  09/24/2017 FINDINGS: Porta catheter with tip at the SVC. Normal heart size. Stable mediastinal contours accounting for leftward rotation. Large lung volumes with trace bilateral pleural fluid. No consolidation or interstitial edema. No acute osseous finding. IMPRESSION: 1. No acute finding when compared to prior. 2. Chronic large lung volumes and trace effusions. Electronically Signed   By: Monte Fantasia M.D.   On: 03/06/2018  10:07        Scheduled Meds: . arformoterol  15 mcg Nebulization BID  . diltiazem  120 mg Oral QPM  . diltiazem  180 mg Oral Daily  . DULoxetine  30 mg Oral Daily  . furosemide  20 mg Oral Daily  . levothyroxine  137 mcg Oral q morning - 10a  . mirtazapine  15 mg Oral QHS  . morphine  15 mg Oral Q12H  . umeclidinium bromide  1 puff Inhalation Daily   Continuous Infusions:    LOS: 1 day    Time spent: 35 minutes    Irine Seal, MD [facility] Pager [phone]  If 7PM-7AM, please contact night-coverage [email or web address] Password [facility] 03/06/2018, 11:43 AM

## 2018-03-06 NOTE — Progress Notes (Addendum)
Patient ID: [name], female   DOB: March 18, 1933, 84 y.o.   MRN: [id number]     Progress Note   Subjective   Feeling pretty good - hungry  Had a normal brown BM this am ! HGB 8.3  Post 2 units this admit   Objective   Vital signs in last 24 hours: Temp:  [97.9 F (36.6 C)-98.5 F (36.9 C)] 97.9 F (36.6 C) (05/30 0612) Pulse Rate:  [102-103] 103 (05/30 0612) Resp:  [18] 18 (05/30 0612) BP: (134-137)/(54-95) 134/54 (05/30 0612) SpO2:  [96 %-100 %] 100 % (05/30 0612) Last BM Date: 03/06/18 General:    Elderly white female in NAD Heart:  Regular rate and rhythm; no murmurs Lungs: Respirations even and unlabored, lungs CTA bilaterally Abdomen:  Soft, nontender and nondistended. Normal bowel sounds. Extremities:  Without edema. Neurologic:  Alert and oriented,  grossly normal neurologically. Psych:  Cooperative. Normal mood and affect.  Intake/Output from previous day: 05/29 0701 - 05/30 0700 In: 1312.5 [P.O.:480; I.V.:732.5; IV Piggyback:100] Out: -  Intake/Output this shift: No intake/output data recorded.  Lab Results: Recent Labs    03/04/18 0607 03/04/18 2324 03/05/18 0352 03/05/18 1500 03/06/18 0420  WBC 5.5 5.4 4.9  --   --   HGB 8.1* 9.5* 9.2* 9.9* 8.3*  HCT 24.3* 27.9* 27.4* 29.2* 24.3*  PLT 91* 84* 85*  --   --    BMET Recent Labs    03/04/18 0607 03/05/18 1005 03/06/18 0420  NA 140 139 141  K 3.1* 3.9 3.4*  CL 108 109 111  CO2 23 23 25   GLUCOSE 123* 102* 105*  BUN 16 7 6   CREATININE 0.76 0.69 0.62  CALCIUM 7.5* 7.3* 7.7*   LFT Recent Labs    03/04/18 0607  PROT 5.6*  ALBUMIN 2.8*  AST 14*  ALT 12*  ALKPHOS 37*  BILITOT 0.5   PT/INR No results for input(s): LABPROT, INR in the last 72 hours.     Assessment / Plan:    #1 84 yo female with ovarian CA with carcinomatosis undergoing chemo with Carboplatin with complication of anemia and thrombocytopenia who was admitted with acute lower GI bleed in setting of  Xarelto  Previously documented Diverticulosis , no prior hx of GI bleeding  Off Xarelto since admit, has required 2 units Prbc's  Normal BM this am - hopefully bleeding has completely resolved   #2 Hx afib  #3 CHF #4 COPD #5 hx lymphocytic colitis  Plan ; Continue serial HGb' s-transfuse for hgb less than 8 OOB, ambulate with help Advance to regular diet today  Possible d/c home in am  Will need to decide on timing of restarting Xarelto - would favor holding 1-2 weeks   Contact  Amy Ester[place], P.A.-C               [phone]      Principal Problem:   Acute GI bleeding Active Problems:   Hypothyroidism   COPD (chronic obstructive pulmonary disease) (HCC)   Atrial fibrillation (HCC)   PAD (peripheral artery disease) ([place])   Ovarian CA, right (HCC)   Protein-calorie malnutrition, moderate (HCC)   Pancytopenia, acquired ([place])   Pleural effusion   Anemia, chronic disease   Peripheral neuropathy due to chemotherapy ([place])   Thrombocytopenia ([place])   Rectal bleeding    LOS: 1 day   Amy Esterwood  03/06/2018, 9:12 AM  GI ATTENDING  Interval history data reviewed. Patient personally seen and examined. Daughter in room. Agree  with interval progress note as outlined. Patient sitting up in chair. More normal-looking bowel movement earlier. Hemoglobin stable. Continue to monitor for recurrent bleeding. Transfuse as needed. Advance diet. Primary service and/or cardiology should assess her risk for stroke noted to guide the best timing for resumption of anticoagulation if clinically important. Discussed with patient and daughter. Hopefully home in early a.m. Hopefully she can be discharged before her outpatient PET scan appointment. We will follow  [name]. Geri Seminole., M.D. [facility] Division of Gastroenterology

## 2018-03-06 NOTE — Progress Notes (Signed)
Patient requesting to finish breakfast at this time. RT will return at later time to administer breathing txs.

## 2018-03-06 NOTE — Progress Notes (Signed)
Assumed care of patient, patient denies pain.  VSS.  Continue current plan of care.

## 2018-03-07 ENCOUNTER — Telehealth (HOSPITAL_COMMUNITY): Payer: Self-pay | Admitting: Internal Medicine

## 2018-03-07 ENCOUNTER — Ambulatory Visit (HOSPITAL_COMMUNITY)
Admission: RE | Admit: 2018-03-07 | Discharge: 2018-03-07 | Disposition: A | Discharge status: Home / Self Care | Payer: Medicare Other | Source: Ambulatory Visit | Attending: Gynecologic Oncology | Admitting: Gynecologic Oncology

## 2018-03-07 ENCOUNTER — Encounter (HOSPITAL_COMMUNITY): Payer: Self-pay

## 2018-03-07 ENCOUNTER — Telehealth: Payer: Self-pay | Admitting: Cardiology

## 2018-03-07 DIAGNOSIS — C569 Malignant neoplasm of unspecified ovary: Secondary | ICD-10-CM

## 2018-03-07 DIAGNOSIS — K5731 Diverticulosis of large intestine without perforation or abscess with bleeding: Secondary | ICD-10-CM

## 2018-03-07 DIAGNOSIS — J90 Pleural effusion, not elsewhere classified: Secondary | ICD-10-CM

## 2018-03-07 DIAGNOSIS — C561 Malignant neoplasm of right ovary: Secondary | ICD-10-CM

## 2018-03-07 DIAGNOSIS — I482 Chronic atrial fibrillation: Secondary | ICD-10-CM

## 2018-03-07 DIAGNOSIS — K922 Gastrointestinal hemorrhage, unspecified: Secondary | ICD-10-CM

## 2018-03-07 LAB — GLUCOSE, CAPILLARY
GLUCOSE-CAPILLARY: 102 mg/dL — AB (ref 65–99)
Glucose-Capillary: 100 mg/dL — ABNORMAL HIGH (ref 65–99)

## 2018-03-07 LAB — BASIC METABOLIC PANEL
Anion gap: 5 (ref 5–15)
BUN: 8 mg/dL (ref 6–20)
CHLORIDE: 110 mmol/L (ref 101–111)
CO2: 26 mmol/L (ref 22–32)
CREATININE: 0.79 mg/dL (ref 0.44–1.00)
Calcium: 7.9 mg/dL — ABNORMAL LOW (ref 8.9–10.3)
GFR calc Af Amer: >60 mL/min (ref >60)
GFR calc non Af Amer: >60 mL/min (ref >60)
Glucose, Bld: 111 mg/dL — ABNORMAL HIGH (ref 65–99)
Potassium: 3.9 mmol/L (ref 3.5–5.1)
Sodium: 141 mmol/L (ref 135–145)

## 2018-03-07 LAB — HEMOGLOBIN AND HEMATOCRIT, BLOOD
HCT: 26.2 % — ABNORMAL LOW (ref 36.0–46.0)
Hemoglobin: 8.9 g/dL — ABNORMAL LOW (ref 12.0–15.0)

## 2018-03-07 MED ORDER — FLUDEOXYGLUCOSE F - 18 (FDG) INJECTION: 7.8000 | Freq: Once | INTRAVENOUS | Status: DC | PRN

## 2018-03-07 MED ORDER — RIVAROXABAN 20 MG PO TABS: ORAL_TABLET | ORAL | 1 refills | Status: DC

## 2018-03-07 NOTE — Telephone Encounter (Signed)
Daughter notified of Dr. Myles Gip recommendations. She voiced understanding.

## 2018-03-07 NOTE — Care Management Note (Signed)
Case Management Note  Patient Details  Name: [name] MRN: [id number] Date of Birth: 08-Sep-1933  Subjective/Objective:                    Action/Plan:Home with no needs.   Expected Discharge Date:  03/07/18               Expected Discharge Plan:  Home/Self Care  In-House Referral:     Discharge planning Services  CM Consult  Post Acute Care Choice:    Choice offered to:     DME Arranged:    DME Agency:     HH Arranged:    HH Agency:     Status of Service:  In process, will continue to follow  If discussed at Long Length of Stay Meetings, dates discussed:    Additional CommentsPurcell Mouton, RN 03/07/2018, 11:02 AM

## 2018-03-07 NOTE — Telephone Encounter (Signed)
I reviewed the recent hospital information including recommendations from GI.  Dr. Henrene Pastor stated that Xarelto should not be started back for the next 2 weeks.  Frankly, with diverticular bleed and likelihood that this could recur, I would not resume Xarelto at this time.  Trying Eliquis might be an option, but this would not necessarily exclude the possibility of recurrent GI bleeding either.  It may be best to discuss the situation further in the office, would try to move her visit up from June 25 of possible.

## 2018-03-07 NOTE — Telephone Encounter (Signed)
03/07/18  patient has been in the hospital this week... appt was cancelled via phone tree

## 2018-03-07 NOTE — Telephone Encounter (Signed)
Pt was admitted for rectal bleeding, they took her off of her Xarelto and she's wanting to know if and when she should start taking it again. She is being d/c today and can be reached @ [phone]

## 2018-03-07 NOTE — Telephone Encounter (Signed)
Returned pt's daughter call. She stated the hospital doctor told her to call Dr. Domenic Polite to see when he wanted pt to restart xarelto. On discharge paper it says to restart on 6/14, but pt's daughter wanted to be sure it was agreed with by Dr. Domenic Polite due to pt's afib. Please advise. Pt has hospital follow-up with Bernerd Pho, Reader on 6/25. Please advise.

## 2018-03-07 NOTE — Telephone Encounter (Signed)
Error

## 2018-03-07 NOTE — Progress Notes (Signed)
Amber, Inpatient RN requested to leave port accessed for PET scan today. Pt stated she will ask radiology team to de-access port after PET.

## 2018-03-07 NOTE — Progress Notes (Signed)
Patient has been discharged. No changes from am assessment. Pt remains a&ox4, ambulatory. Discharged instructions reviewed with pt and daughter. Questions concerns denied. Port a cath left accessed for scheduled PET scan today at 1130 am. IV team will deaccess after scan completed. Provider updated.

## 2018-03-07 NOTE — Progress Notes (Addendum)
Patient ID: [name], female   DOB: 1933-08-14, 84 y.o.   MRN: [id number]      Progress Note   Subjective    Feels fine, no c/o - hoping for discharge today as has PET scan schedule this afternoon  Normal BM again this am   HGB 8.9 - stable , improved    Objective   Vital signs in last 24 hours: Temp:  [98.1 F (36.7 C)-99 F (37.2 C)] 98.7 F (37.1 C) (05/31 0437) Pulse Rate:  [87-106] 102 (05/31 0437) Resp:  [17-20] 18 (05/31 0437) BP: (111-142)/(55-77) 111/77 (05/31 0437) SpO2:  [95 %-99 %] 98 % (05/31 0437) Weight:  [169 lb 5 oz (76.8 kg)] 169 lb 5 oz (76.8 kg) (05/30 1452) Last BM Date: 03/06/18 General:  elderly  white female in NAD Heart:  Regular rate and rhythm; no murmurs Lungs: Respirations even and unlabored, lungs CTA bilaterally, portacath right chest  Abdomen:  Soft, nontender and nondistended. Normal bowel sounds. Extremities:  Without edema. Neurologic:  Alert and oriented,  grossly normal neurologically. Psych:  Cooperative. Normal mood and affect.  Intake/Output from previous day: 05/30 0701 - 05/31 0700 In: 625 [P.O.:400; I.V.:225] Out: 950 [Urine:950] Intake/Output this shift: No intake/output data recorded.  Lab Results: Recent Labs    03/04/18 2324 03/05/18 0352  03/06/18 0420 03/06/18 1702 03/07/18 0455  WBC 5.4 4.9  --   --   --   --   HGB 9.5* 9.2*   < > 8.3* 8.3* 8.9*  HCT 27.9* 27.4*   < > 24.3* 25.1* 26.2*  PLT 84* 85*  --   --   --   --    < > = values in this interval not displayed.   BMET Recent Labs    03/05/18 1005 03/06/18 0420 03/07/18 0455  NA 139 141 141  K 3.9 3.4* 3.9  CL 109 111 110  CO2 23 25 26   GLUCOSE 102* 105* 111*  BUN 7 6 8   CREATININE 0.69 0.62 0.79  CALCIUM 7.3* 7.7* 7.9*   LFT No results for input(s): PROT, ALBUMIN, AST, ALT, ALKPHOS, BILITOT, BILIDIR, IBILI in the last 72 hours. PT/INR No results for input(s): LABPROT, INR in the last 72 hours.  Studies/Results: Dg Chest 2  View  Result Date: 03/06/2018 CLINICAL DATA:  Hypoxia EXAM: CHEST - 2 VIEW COMPARISON:  09/24/2017 FINDINGS: Porta catheter with tip at the SVC. Normal heart size. Stable mediastinal contours accounting for leftward rotation. Large lung volumes with trace bilateral pleural fluid. No consolidation or interstitial edema. No acute osseous finding. IMPRESSION: 1. No acute finding when compared to prior. 2. Chronic large lung volumes and trace effusions. Electronically Signed   By: Monte Fantasia M.D.   On: 03/06/2018 10:07       Assessment / Plan:     #1 84 yo female with Ovarian CA with carcinomatosis  Undergoing chemo admitted with acute self limited GI bleed C/W Diverticular bleed . Pt with known Diverticulosis   She is stable, no active bleeding  In past 30 hours  Ok for discharge  Today -discussed with Dr [place] Silos  No ASA/ Nsaids Hold  Xarelto for 2 weeks   She has a follow up in our office next week 6/5  At 3 pm  With Amy [place] PA-C,- will repeat labs at that time      Contact  Amy [place], P.A.-C               [phone]  Principal Problem:   Acute GI bleeding Active Problems:   Hypothyroidism   COPD (chronic obstructive pulmonary disease) (HCC)   Atrial fibrillation (HCC)   PAD (peripheral artery disease) (HCC)   Ovarian CA, right (HCC)   Protein-calorie malnutrition, moderate (HCC)   Pancytopenia, acquired (HCC)   Pleural effusion   Anemia, chronic disease   Peripheral neuropathy due to chemotherapy (HCC)   Thrombocytopenia (HCC)   Rectal bleeding     LOS: 2 days   Amy Esterwood  03/07/2018, 9:07 AM   GI ATTENDING  Interval history data reviewed. Patient seen and examined. Agree with interval progress note. Ready for discharge. Follow-up as arranged.  [name]. Geri Seminole., M.D. [facility] Division of Gastroenterology

## 2018-03-07 NOTE — Discharge Summary (Signed)
Physician Discharge Summary  [name] QQP:[id number] DOB: 07/19/1933 DOA: 03/03/2018  PCP: Laura Noble, MD  Admit date: 03/03/2018 Discharge date: 03/07/2018  Time spent: 65 minutes  Recommendations for Outpatient Follow-up:  1. Follow-up with Laura Ba, PA gastroenterology on March 12, 2018 at 3 PM for hospital follow-up and repeat labs of a CBC to follow-up on H&H. 2. Follow-up with Laura Noble, MD in 2 weeks.  On follow-up patient's need for Xarelto will need to be reassessed and if patient needs to be started back on his Xarelto could be resumed in 2 weeks post discharge.  Patient will need a CBC as well as a basic metabolic profile done on follow-up.   Discharge Diagnoses:  Principal Problem:   Acute GI bleeding Active Problems:   Hypothyroidism   COPD (chronic obstructive pulmonary disease) (HCC)   Atrial fibrillation (HCC)   PAD (peripheral artery disease) (HCC)   Ovarian CA, right (HCC)   Protein-calorie malnutrition, moderate (HCC)   Pancytopenia, acquired (HCC)   Pleural effusion   Anemia, chronic disease   Peripheral neuropathy due to chemotherapy (HCC)   Thrombocytopenia (HCC)   Rectal bleeding   Lower GI bleed   Discharge Condition: stable and improved  Diet recommendation: Heart healthy  Filed Weights   03/03/18 2308 03/04/18 0538 03/06/18 1452  Weight: 73.5 kg (162 lb) 75.2 kg (165 lb 12.6 oz) 76.8 kg (169 lb 5 oz)    History of present illness:  Per LauraKakrakandy [name] is a 84 y.o. female with history of ovarian carcinoma, COPD, chronic diastolic CHF, atrial fibrillation hypothyroidism, pancytopenia secondary to chemotherapy and history of microscopic colitis presents to the ER after patient had 4 episodes of rectal bleeding at home.  Patient takes Xarelto for atrial fibrillation.  Patient did take her Xarelto the night prior to admission, after the second episode of GI bleed.  Denies any abdominal pain nausea vomiting.  Denied any loss of  consciousness or syncope.  ED Course: In the ER patient initially was mildly hypotensive and tachycardic improved with fluids.  Hemoglobin was around 9.1 dropped from 11 last month.  On exam patient is hemodynamically stable abdomen appears benign.  Anoscopy done in 2011 showed diverticulosis.  CAT scan done 2 weeks ago also showed diverticulosis.  Patient admitted for further management of rectal bleeding likely could be diverticular bleeding in the setting of anticoagulation.  Dr. Silverio [name] on-call gastroenterology has been consulted    Hospital Course:  1 acute GI bleed likely lower GI bleed secondary to diverticular bleed/rectal bleeding in the setting of Xarelto Patient presented with rectal bleeding in the setting of Xarelto.  Patient was monitored placed on telemetry and serial CBCs/H&H obtained.  Patient Xarelto  was held throughout the hospitalization due to patient's acute bleed.  It was felt patient's acute bleed was likely diverticular in nature.  Gastroenterology was consulted who followed the patient throughout the hospitalization.  Patient was transfused 2 units packed red blood cells on 03/04/2018.  Patient's bleed slowed down and patient started to have normal bowel movements.  Patient's hemoglobin stabilized such that by day of discharge hemoglobin was 8.9.  Patient had no active bleeding for over 30 hours prior to discharge.  Patient improved clinically.  Diet was advanced and patient was tolerating a soft diet by day of discharge.  Patient will be discharged in stable and improved condition.   2.  Acute blood loss anemia Secondary to problem #1.    Patient was transfused 2 units of  packed red blood cells during the hospitalization.  Hemoglobin stabilized at 8.9 by day of discharge.  Patient will follow up with GI in the outpatient setting in 1 week at which point in time a hemoglobin will be repeated.    3.  Hypothyroidism Maintained on home regimen of Synthroid.    4.  Atrial  fibrillation CHA2DS2VASC > 3 Patient maintained on her home regimen of Cardizem for rate control.  Anticoagulation was held during the hospitalization secondary to problem #1.  Per GI to defer to PCP or cardiology to assess patient's risk for stroke to determine best timing for resumption of anticoagulation.  Patient Xarelto on discharge will be held for 2 weeks prior to resumption if needed to be restarted per PCP or cardiologist recommendations.  5.  COPD Stable.  Patient maintained on Brovana and Bertram.  Duo nebs as needed.   6.  Thrombocytopenia Secondary to chemotherapy.  Outpatient follow-up.    7.  History of recurrent pleural effusion Stable during the hospitalization.  Chest x-ray which was done had no acute findings with chronic large lung volumes and trace effusions.  Outpatient follow-up with pulmonary who will reassess, and was set up for thoracentesis if needed.  8.  History of microscopic colitis Remained stable.  Outpatient follow-up with GI.    9.  Hypokalemia/hypomagnesemia Likely secondary to GI losses.  Repleted by day of discharge.     Procedures:  Transfused 2 units packed red blood cells 03/04/2018.      Consultations:  Gastroenterology: Dr. Henrene [name] 03/04/2018      Discharge Exam: Vitals:   03/07/18 0437 03/07/18 0906  BP: 111/77   Pulse: (!) 102   Resp: 18   Temp: 98.7 F (37.1 C)   SpO2: 98% 95%    General: NAD Cardiovascular: RRR Respiratory: CTAB  Discharge Instructions   Discharge Instructions    Diet - low sodium heart healthy   Complete by:  As directed    Increase activity slowly   Complete by:  As directed      Allergies as of 03/07/2018      Reactions   Codeine Itching      Medication List    STOP taking these medications   loperamide 2 MG capsule Commonly known as:  IMODIUM     TAKE these medications   albuterol 108 (90 Base) MCG/ACT inhaler Commonly known as:  PROAIR HFA Inhale 2 puffs into the lungs  every 6 (six) hours as needed for wheezing or shortness of breath.   ALPRAZolam 0.25 MG tablet Commonly known as:  XANAX Take 1 tablet (0.25 mg total) by mouth at bedtime as needed for anxiety.   azelastine 0.1 % nasal spray Commonly known as:  ASTELIN Place 2 sprays into both nostrils 2 (two) times daily as needed for allergies.   diltiazem 180 MG 24 hr capsule Commonly known as:  CARDIZEM CD Take 1 capsule (180 mg total) by mouth every morning.   diltiazem 120 MG 24 hr capsule Commonly known as:  CARDIZEM CD Take 1 capsule (120 mg total) by mouth every evening.   diphenhydrAMINE 25 mg capsule Commonly known as:  BENADRYL Take 25 mg by mouth every 6 (six) hours as needed for itching.   diphenoxylate-atropine 2.5-0.025 MG tablet Commonly known as:  LOMOTIL Take 1 tablet by mouth 4 (four) times daily. What changed:    when to take this  reasons to take this   DULoxetine 30 MG capsule Commonly known as:  CYMBALTA TAKE  1 CAPSULE(30 MG) BY MOUTH DAILY   fluticasone 50 MCG/ACT nasal spray Commonly known as:  FLONASE Place 2 sprays into both nostrils 2 (two) times daily. For nasal congestion   furosemide 20 MG tablet Commonly known as:  LASIX Take 1 tablet (20 mg total) by mouth daily.   HYDROcodone-acetaminophen 10-325 MG tablet Commonly known as:  NORCO Take 1 tablet by mouth every 4 (four) hours as needed. What changed:  reasons to take this   ipratropium-albuterol 0.5-2.5 (3) MG/3ML Soln Commonly known as:  DUONEB Take 3 mLs by nebulization 3 (three) times daily. What changed:    when to take this  reasons to take this  additional instructions   levothyroxine 137 MCG tablet Commonly known as:  SYNTHROID, LEVOTHROID Take 137 mcg by mouth every morning. For thyroid therapy   lidocaine-prilocaine cream Commonly known as:  EMLA Apply 1 application topically as needed. What changed:  reasons to take this   mirtazapine 15 MG tablet Commonly known as:   REMERON TAKE 1 TABLET(15 MG) BY MOUTH AT BEDTIME What changed:  See the new instructions.   morphine 15 MG 12 hr tablet Commonly known as:  MS CONTIN Take 1 tablet (15 mg total) by mouth every 12 (twelve) hours.   polyvinyl alcohol 1.4 % ophthalmic solution Commonly known as:  LIQUIFILM TEARS 1 drop 2 (two) times daily as needed for dry eyes.   potassium chloride 10 MEQ tablet Commonly known as:  K-DUR Take 10 mEq by mouth daily.   rivaroxaban 20 MG Tabs tablet Commonly known as:  XARELTO TAKE 1 TABLET BY MOUTH EVERY DAY WITH SUPPER Start taking on:  03/21/2018 What changed:  These instructions start on 03/21/2018. If you are unsure what to do until then, ask your doctor or other care provider.   Tiotropium Bromide-Olodaterol 2.5-2.5 MCG/ACT Aers Commonly known as:  STIOLTO RESPIMAT Inhale 2 puffs into the lungs daily.   Vitamin D 2000 units Caps Take 2,000 Units by mouth daily.      Allergies  Allergen Reactions  . Codeine Itching   Follow-up Information    Esterwood, Amy S, PA-C. Go on 03/12/2018.   Specialty:  Gastroenterology Why:   at 3 pm Contact information: [place] [address] [phone]        Laura Noble, MD. Schedule an appointment as soon as possible for a visit in 2 week(s).   Specialty:  Internal Medicine Why:  f/u in 2 weeks. Contact information: [address] [place] [address] [phone]        Satira Sark, MD .   Specialty:  Cardiology Contact information: [place] [address] [phone]            The results of significant diagnostics from this hospitalization (including imaging, microbiology, ancillary and laboratory) are listed below for reference.    Significant Diagnostic Studies: Dg Chest 2 View  Result Date: 03/06/2018 CLINICAL DATA:  Hypoxia EXAM: CHEST - 2 VIEW COMPARISON:  09/24/2017 FINDINGS: Porta catheter with tip at the SVC. Normal heart size. Stable  mediastinal contours accounting for leftward rotation. Large lung volumes with trace bilateral pleural fluid. No consolidation or interstitial edema. No acute osseous finding. IMPRESSION: 1. No acute finding when compared to prior. 2. Chronic large lung volumes and trace effusions. Electronically Signed   By: Monte Fantasia M.D.   On: 03/06/2018 10:07   Ct Abdomen Pelvis W Contrast  Result Date: 02/25/2018 CLINICAL DATA:  Right ovarian  cancer diagnosed 07/01/2017. Patient presents for restaging after interval chemotherapy. EXAM: CT ABDOMEN AND PELVIS WITH CONTRAST TECHNIQUE: Multidetector CT imaging of the abdomen and pelvis was performed using the standard protocol following bolus administration of intravenous contrast. CONTRAST:  111m ISOVUE-300 IOPAMIDOL (ISOVUE-300) INJECTION 61% COMPARISON:  11/28/2017 CT abdomen/pelvis. FINDINGS: Lower chest: Small dependent right pleural effusion, increased with increased mild dependent right lung base atelectasis. Right coronary atherosclerosis. Hepatobiliary: Normal liver size. No liver mass. Cholecystectomy. Bile ducts are stable and within normal post cholecystectomy limits with CBD diameter 8 mm. Pancreas: Cystic 0.9 cm pancreatic body lesion (series 2/image 17), decreased from 1.4 cm. No new pancreatic lesions. No pancreatic duct dilation. Spleen: Normal size. No mass. Adrenals/Urinary Tract: Normal adrenals. No hydronephrosis. Stable subcentimeter hypodense renal cortical lesions in both kidneys, too small to characterize probably benign. No new renal lesions. Normal bladder. Stomach/Bowel: Normal non-distended stomach. Normal caliber small bowel with no small bowel wall thickening. Appendix not discretely visualized. No pericecal inflammatory changes. Mild sigmoid diverticulosis. No definite large bowel wall thickening or acute pericolonic fat stranding. Apparent wall thickening in the sigmoid colon and rectum is favored to be due to under distention.  Vascular/Lymphatic: Atherosclerotic nonaneurysmal abdominal aorta. Patent portal, splenic, hepatic and renal veins. Retroaortic left renal vein. No pathologically enlarged lymph nodes in the abdomen or pelvis. Reproductive: Right adnexal 4.0 cm cyst (series 2/image 60), previously 3.9 cm, not appreciably changed. No left adnexal mass. Grossly normal atrophic uterus. Other: No pneumoperitoneum. No focal fluid collection. No ascites. Soft tissue caking in the omentum appears qualitatively stable to slightly decreased (series 2/image 54). Stable mild peritoneal thickening in the paracolic gutters. No new focal peritoneal implants. Musculoskeletal: No aggressive appearing focal osseous lesions. Partially visualized fixation hardware in the proximal left femur. Marked lumbar spondylosis. Stable mild to moderate compression deformity of the superior L5 vertebral body. IMPRESSION: 1. Omental carcinomatosis appears qualitatively stable to slightly decreased. Stable mild peritoneal thickening in the paracolic gutters. Stable right adnexal cyst. No ascites. No new or progressive metastatic disease in the abdomen or pelvis. 2. Small dependent right pleural effusion is increased. 3. Cystic pancreatic body lesion is decreased and now subcentimeter in size, suggesting a benign lesion. 4.  Aortic Atherosclerosis (ICD10-I70.0). Electronically Signed   By: JIlona SorrelM.D.   On: 02/25/2018 14:16    Microbiology: No results found for this or any previous visit (from the past 240 hour(s)).   Labs: Basic Metabolic Panel: Recent Labs  Lab 03/04/18 0048 03/04/18 0607 03/05/18 1005 03/06/18 0420 03/07/18 0455  NA 139 140 139 141 141  K 3.4* 3.1* 3.9 3.4* 3.9  CL 105 108 109 111 110  CO2 24 23 23 25 26   GLUCOSE 120* 123* 102* 105* 111*  BUN 19 16 7 6 8   CREATININE 0.83 0.76 0.69 0.62 0.79  CALCIUM 8.0* 7.5* 7.3* 7.7* 7.9*  MG  --   --  1.4* 2.2  --    Liver Function Tests: Recent Labs  Lab 03/04/18 0048  03/04/18 0607  AST 13* 14*  ALT 11* 12*  ALKPHOS 44 37*  BILITOT 0.4 0.5  PROT 6.4* 5.6*  ALBUMIN 3.2* 2.8*   No results for input(s): LIPASE, AMYLASE in the last 168 hours. No results for input(s): AMMONIA in the last 168 hours. CBC: Recent Labs  Lab 03/04/18 0048 03/04/18 0607 03/04/18 2324 03/05/18 0352 03/05/18 1500 03/06/18 0420 03/06/18 1702 03/07/18 0455  WBC 7.0 5.5 5.4 4.9  --   --   --   --  HGB 9.2* 8.1* 9.5* 9.2* 9.9* 8.3* 8.3* 8.9*  HCT 27.2* 24.3* 27.9* 27.4* 29.2* 24.3* 25.1* 26.2*  MCV 97.5 97.6 94.9 95.5  --   --   --   --   PLT 99* 91* 84* 85*  --   --   --   --    Cardiac Enzymes: No results for input(s): CKTOTAL, CKMB, CKMBINDEX, TROPONINI in the last 168 hours. BNP: BNP (last 3 results) Recent Labs    07/07/17 1057 11/12/17 0102  BNP 42.9 76.7    ProBNP (last 3 results) Recent Labs    08/12/17 1137  PROBNP 837*    CBG: Recent Labs  Lab 03/05/18 0234 03/05/18 0813 03/06/18 0848 03/07/18 0751  GLUCAP 100* 114* 111* 100*       Signed:  Irine Seal MD.  [facility] 03/07/2018, 9:29 AM

## 2018-03-10 ENCOUNTER — Encounter: Payer: Self-pay | Admitting: Obstetrics

## 2018-03-10 ENCOUNTER — Encounter: Payer: Self-pay | Admitting: Oncology

## 2018-03-10 ENCOUNTER — Telehealth: Payer: Self-pay | Admitting: Adult Health

## 2018-03-10 ENCOUNTER — Inpatient Hospital Stay: Payer: Medicare Other | Attending: Hematology and Oncology | Admitting: Obstetrics

## 2018-03-10 ENCOUNTER — Other Ambulatory Visit (HOSPITAL_COMMUNITY)
Admission: RE | Admit: 2018-03-10 | Discharge: 2018-03-10 | Disposition: A | Discharge status: Home / Self Care | Payer: Medicare Other | Source: Ambulatory Visit | Attending: Obstetrics | Admitting: Obstetrics

## 2018-03-10 VITALS — BP 137/52 | HR 98 | Temp 98.1°F | Resp 20 | Ht 64.0 in | Wt 161.1 lb

## 2018-03-10 DIAGNOSIS — Z9221 Personal history of antineoplastic chemotherapy: Secondary | ICD-10-CM | POA: Diagnosis not present

## 2018-03-10 DIAGNOSIS — Z79899 Other long term (current) drug therapy: Secondary | ICD-10-CM | POA: Diagnosis not present

## 2018-03-10 DIAGNOSIS — J910 Malignant pleural effusion: Secondary | ICD-10-CM

## 2018-03-10 DIAGNOSIS — Z90722 Acquired absence of ovaries, bilateral: Secondary | ICD-10-CM | POA: Diagnosis not present

## 2018-03-10 DIAGNOSIS — J449 Chronic obstructive pulmonary disease, unspecified: Secondary | ICD-10-CM | POA: Diagnosis not present

## 2018-03-10 DIAGNOSIS — Z90710 Acquired absence of both cervix and uterus: Secondary | ICD-10-CM | POA: Diagnosis not present

## 2018-03-10 DIAGNOSIS — C562 Malignant neoplasm of left ovary: Secondary | ICD-10-CM

## 2018-03-10 DIAGNOSIS — I700 Atherosclerosis of aorta: Secondary | ICD-10-CM | POA: Insufficient documentation

## 2018-03-10 DIAGNOSIS — J90 Pleural effusion, not elsewhere classified: Secondary | ICD-10-CM | POA: Insufficient documentation

## 2018-03-10 DIAGNOSIS — C569 Malignant neoplasm of unspecified ovary: Secondary | ICD-10-CM

## 2018-03-10 NOTE — Telephone Encounter (Signed)
To clarify is it okay to order the PFT since the diagnosis is COPD?

## 2018-03-10 NOTE — Telephone Encounter (Signed)
Will call [name] tomorrow since it is after 5pm.

## 2018-03-10 NOTE — Telephone Encounter (Signed)
Spoke with [name], she states pt is scheduled for thoracentesis at [facility] 03/17/2018 at 845 am.

## 2018-03-10 NOTE — Telephone Encounter (Signed)
Called and spoke with [facility], she states that the referral says she has chronic bronchitis. In the doctors notes it says she has COPD. Per the patients insurance if she has COPD then she will need a PFT.  They are needing to know which diagnosis is correct.    TP please advise thank you.

## 2018-03-10 NOTE — Telephone Encounter (Signed)
The order itself says referral is for COPD Gold 2, not sure why the order is associated with chronic bronchitis diagnosis Okay to reorder the pulm rehab referral and ensure that order is associated with correct diagnosis Thank you!

## 2018-03-10 NOTE — Telephone Encounter (Signed)
So sorry for not seeing that and addressing it earlier  Per TP: there are more pressing issues right now (she has a pleural effusion that needs to be tapped).  We can do a spirometry when she comes back to the office and hold off on pulmonary rehab until then.  Thank you.

## 2018-03-10 NOTE — Patient Instructions (Addendum)
1) We will reach out to your cardiologist to obtain cardiac clearance. 2) We will also arrange for you to have a thoracentesis on June 10 prior to surgery on June 13.  Go to [facility] 1st floor radiology at 8:45am for a 9am appt.                 Preparing for your Surgery  Plan for surgery on March 20, 2018 with Dr. Precious Haws at [place] will be scheduled for an exploratory laparotomy, total abdominal hysterectomy, bilateral salpingo-oophorectomy, omentectomy, debulking.   Pre-operative Testing -You will receive a phone call from presurgical testing at [facility] to arrange for a pre-operative testing appointment before your surgery.  This appointment normally occurs one to two weeks before your scheduled surgery.   -Bring your insurance card, copy of an advanced directive if applicable, medication list  -At that visit, you will be asked to sign a consent for a possible blood transfusion in case a transfusion becomes necessary during surgery.  The need for a blood transfusion is rare but having consent is a necessary part of your care.     -You should not be taking blood thinners or aspirin at least ten days prior to surgery unless instructed by your surgeon.  Day Before Surgery at [place] will be asked to take in a light diet the day before surgery.  Avoid carbonated beverages.  You will be advised to have nothing to eat or drink after midnight the evening before.    Eat a light diet the day before surgery.  Examples including soups, broths, toast, yogurt, mashed potatoes.  Things to avoid include carbonated beverages (fizzy beverages), raw fruits and raw vegetables, or beans.   If your bowels are filled with gas, your surgeon will have difficulty visualizing your pelvic organs which increases your surgical risks.  Your role in recovery Your role is to become active as soon as directed by your doctor, while still giving yourself time to heal.  Rest when you  feel tired. You will be asked to do the following in order to speed your recovery:  - Cough and breathe deeply. This helps toclear and expand your lungs and can prevent pneumonia. You may be given a spirometer to practice deep breathing. A staff member will show you how to use the spirometer. - Do mild physical activity. Walking or moving your legs help your circulation and body functions return to normal. A staff member will help you when you try to walk and will provide you with simple exercises. Do not try to get up or walk alone the first time. - Actively manage your pain. Managing your pain lets you move in comfort. We will ask you to rate your pain on a scale of zero to 10. It is your responsibility to tell your doctor or nurse where and how much you hurt so your pain can be treated.  Special Considerations -If you are diabetic, you may be placed on insulin after surgery to have closer control over your blood sugars to promote healing and recovery.  This does not mean that you will be discharged on insulin.  If applicable, your oral antidiabetics will be resumed when you are tolerating a solid diet.  -Your final pathology results from surgery should be available by the Friday after surgery and the results will be relayed to you when available.  -Dr. Lahoma Crocker is the Surgeon that assists your GYN Oncologist with surgery.  The next day after your surgery you will either see your GYN Oncologist or Dr. Lahoma Crocker.   Blood Transfusion Information WHAT IS A BLOOD TRANSFUSION? A transfusion is the replacement of blood or some of its parts. Blood is made up of multiple cells which provide different functions.  Red blood cells carry oxygen and are used for blood loss replacement.  White blood cells fight against infection.  Platelets control bleeding.  Plasma helps clot blood.  Other blood products are available for specialized needs, such as hemophilia or other clotting  disorders. BEFORE THE TRANSFUSION  Who gives blood for transfusions?   You may be able to donate blood to be used at a later date on yourself (autologous donation).  Relatives can be asked to donate blood. This is generally not any safer than if you have received blood from a stranger. The same precautions are taken to ensure safety when a relative's blood is donated.  Healthy volunteers who are fully evaluated to make sure their blood is safe. This is blood bank blood. Transfusion therapy is the safest it has ever been in the practice of medicine. Before blood is taken from a donor, a complete history is taken to make sure that person has no history of diseases nor engages in risky social behavior (examples are intravenous drug use or sexual activity with multiple partners). The donor's travel history is screened to minimize risk of transmitting infections, such as malaria. The donated blood is tested for signs of infectious diseases, such as HIV and hepatitis. The blood is then tested to be sure it is compatible with you in order to minimize the chance of a transfusion reaction. If you or a relative donates blood, this is often done in anticipation of surgery and is not appropriate for emergency situations. It takes many days to process the donated blood. RISKS AND COMPLICATIONS Although transfusion therapy is very safe and saves many lives, the main dangers of transfusion include:   Getting an infectious disease.  Developing a transfusion reaction. This is an allergic reaction to something in the blood you were given. Every precaution is taken to prevent this. The decision to have a blood transfusion has been considered carefully by your caregiver before blood is given. Blood is not given unless the benefits outweigh the risks.

## 2018-03-10 NOTE — Telephone Encounter (Signed)
Pt's daughter Donna is calling back [phone] 

## 2018-03-10 NOTE — Telephone Encounter (Signed)
Patient seen by TP on 5.24.19 Thoracentesis was ordered to be done after pt's PET scan Since office visit, pt has been hospitalized  PET scan was done on 5.31.19 TP would like to know if patient is ready for the thoracentesis  LMOM TCB x1

## 2018-03-11 ENCOUNTER — Encounter: Payer: Self-pay | Admitting: Obstetrics

## 2018-03-11 NOTE — Telephone Encounter (Signed)
According to discharge she is off Xarelto right now as was admitted for GIB .  Makes sure she is off -verify that with pt/daughter as going for thoracentesis .

## 2018-03-11 NOTE — H&P (View-Only) (Signed)
Progress Note: Gyn-Onc   CC:  Chief Complaint  Patient presents with  . Malignant neoplasm of ovary, unspecified laterality [facility])    HPI: [name]  is a very nice 84 y.o. P1  . Interval History:   She has been receiving single agent chemotherapy since 12/13/17. She received Cycle 3 on 02/04/18.  Met the patient for the first time 02/27/2018.  At that time on my review of the CT imaging I was concerned about disease on the diaphragm when comparing the scans to several that she had back in as far as 2012.  Given the risk for high morbidity surgery and the patient's clinical scenario I recommended we obtain a PET scan to evaluate for residual metastatic disease prior to offering surgical resection.    Since her last visit she did have an admission for rectal bleeding.  She was transfused and seen by GI.  She has follow-up with the GI physicians later this week.  No her history of atrial fibrillation and she was on Xarelto which is now held.    She has appointment with pulmonary June 24.  Her cardiologist is Dr. Domenic Polite who she recently saw.  Her primary care physician is Dr. Willey Blade who she has not seen for some time.    She presents with her daughter today.  With regard to her response to therapy there appears to be stabilization of the disease. Her CA125 is hovering in the mid 100 range. Her imaging 02/25/18 agrees with probable stable disease since prior on 11/28/17. The left adnexal mass seen on the 06/2017 scan when she was diagnosed is now by my measurements 1.8x3.5cm; previously 3.5 x 2.8cm. The omental cake, compared to 06/2017 seems improved.   I am concerned about some of the findings: 1. Imaging suggesting the right sided pleural effusion is worsening; while on treatment --> we previously discussed this.  This is also present and being read as a moderate pleural effusion on the recent PET scan 2. The near plateauing of the CA125 3. What I think is diaphragmatic disease on the  right hemidiaphragm after reviewing several imaging studies dating back to 2012 --> however the PET scan does not show any abnormal uptake in this region   . Initial Presentation: She was initially seen in consultation 01/2017 at the request of Dr Lynnette Caffey for a 4cm right ovarian cyst and elevated CA 125.  The cyst was first detected on imaging (CT) in 2012 (6 years prior to initial referral) and was approximately 4cm. It was again seen on a CT scan in August, 2017 and was stable in dimension, and described by radiology as likely being benign.   Her CA 125 in May 2017 was 45. It was 80 in August, 2017.   She was scheduled for a repeat CA 125 in March, 13, 2018 this was elevated to 133.5.  It was at this time she was referred to [facility].  The patient denied abdominal bloating or early satiety and she had no vaginal bleeding.  Her major complaint was back pain which has been very bad in the past 6 months and she has been regularly on steroid tapers (most recently completed 2 weeks ago). She has received multiple joint injections for this also.  She takes Xarelto for atrial fibrillation. She has ulcerative colitis. Her bouts have been less prominent lately, likely secondary to prednisone use for her back.  CT scan on 02/07/17 was ordered due to the rising CA 125. It showed  a normal appearance of uterus. A simple appearing cystic lesion is again seen in the right adnexa which measures 3.8 x 3.7cm. This remains stable since 2012, consistent with benign etiology. No other masses or inflammatory process identified.  Given her serious medical comorbidities, including COPD, surgery was not performed for what appeared to be consistent with a benign lesion as the risk were felt to outweigh the benefits.  In August, 2018 she began experiencing increasing abdominal distension and discomfort.  On 07/07/17 she was admitted to [facility] for abdominal distension and SOB. CT imaging showed  interim finding of moderate ascites within the abdomen and pelvis with additional finding of diffuse nodular infiltration of the omentum and anterior mesenteric fat, the appearance would be consistent with peritoneal carcinomatosis/metastatic disease. Increasing retroperitoneal and upper abdominal adenopathy. Re- demonstrated 3.8 cm cyst in the right adnexa. Enlarging soft tissue density in the left adnexa now with possible cystic component posteriorly. In light of the above findings, concern is for ovarian neoplasm. Correlation with pelvic ultrasound recommended. Small right-sided pleural effusion, new since prior study Stable hypodense splenic lesions since 2017.  US guided paracentesis was performed on 07/01/17 which showed adenocarcinoma, favor gynecologic primary. She had multiple paracenteses because her ascites continued to accumulate and caused respiratory distress. In addition she had pleural effusions but these were considered too small for thoracentesis.   She initiated neoadjuvant chemotherapy due to her poor performance status. She underwent chemotherapy with Carbo/Taxol x 6 cycles 07/19/17-11/04/17. She saw Dr. Fermin Schwab after Cycle 3 and due to her poor performance status and continued oxygen requirement was felt not to be a surgical candidate at that time.   She then had PNA and was sent to rehab for a bit. She followed up with Dr. Skeet Latch 12/05/17 with initial thought for reconsideration of interval debulking, but due to the setback from the PNA and continued poor pulmonary function it was recommended against surgery at that time. She was having a difficult time with double-agent chemotherapy; experiencing debilitated state and significant arthralgias. She was changed to single agent Carboplatin receiving the first cycle of that drug 12/13/17 (about 5.5 weeks from her last Carbo/Tax dosing).   Measurement of disease:  Recent Labs    07/18/17 1421 08/09/17 0948 08/15/17 0935  09/20/17 1011 10/14/17 0838 11/04/17 0853 12/13/17 1232 01/03/18 1305 01/14/18 1343 02/04/18 1238  CAN125 1,941.0* 1,665.0* 937.9* 347.0* 307.4* 262.5* 197.7* 183.1* 177.4* 168.5*    Carbo/Taxol C1 07/19/2017 --> C6 11/04/2017 changed to C1 single agent carbo 12/13/2017 --> C3 single agent carbo 02/04/2018   Radiology:  Multiple CT scans in the system reviewed by me personally  03/07/2018-PET-moderate sized right pleural effusion, small left pleural effusion.  No hypermetabolic mediastinal or hilar nodes.  Stable hazy interstitial nodularity in the omentum without discrete measurable nodules or hyper mid to botulism.  No evidence of peritoneal surface disease involving the liver.  No hepatic metastatic disease.  No hypermetabolic abdominal or pelvic lymph nodes.  Stable 4 cm simple appearing right adnexal cyst no hypermetabolic in or around that lesion.  Left hip hardware.    Oncologic History:      Ovarian CA, right ([place])   02/18/2016 Tumor Marker    Patient's tumor was tested for the following markers: CA125 Results of the tumor marker test revealed 45      05/22/2016 Tumor Marker    Patient's tumor was tested for the following markers: CA125 Results of the tumor marker test revealed 53  05/22/2016 Imaging    Outside pelvic US showed 4.1 cm adnexa mass      06/24/2017 Imaging    Ct abdomen and pelvis:  1. Interim finding of moderate ascites within the abdomen and pelvis with additional finding of diffuse nodular infiltration of the omentum and anterior mesenteric fat, the appearance would be consistent with peritoneal carcinomatosis/metastatic disease. Increasing retroperitoneal and upper abdominal adenopathy. 2. Re- demonstrated 3.8 cm cyst in the right adnexa. Enlarging soft tissue density in the left adnexa now with possible cystic component posteriorly. In light of the above findings, concern is for ovarian neoplasm. Correlation with pelvic ultrasound recommended. 3.  Small right-sided pleural effusion, new since prior study 4. Stable hypodense splenic lesions since 2017.       06/25/2017 Imaging    US pelvis: 2.9 cm simple appearing cyst in the right ovary. Left ovary grossly unremarkable. Large volume ascites in the pelvis      06/30/2017 - 07/01/2017 Hospital Admission    She was admitted for evaluation of abdominal pain and ascites      07/01/2017 Pathology Results    PERITONEAL/ASCITIC FLUID(SPECIMEN 1 OF 1 COLLECTED 07/01/17): - POORLY DIFFERENTIATED CARCINOMA; SEE COMMENT Source Peritoneal/Ascitic Fluid, (specimen 1 of 1 collected 07/01/17) Gross Specimen: Received is/are 1000 cc's of brownish fluid. (BS:bs) Prepared: # Smears: 0 # Concentration Technique Slides (i.e. ThinPrep): 1 # Cell Block: 1 Additional Studies: Also received Hematology slide - [id number]. Comment The tumor cells are positive for cytokeratin 7 and Pax-8 but negative for cytokeratin 20, CDX-2, GATA-3, Napsin-A and TTF-1. Based on the immunoprofile a gynecology primary is favored      07/01/2017 Procedure    Successful ultrasound-guided diagnostic and therapeutic paracentesis yielding 2.5 liters of peritoneal fluid      07/07/2017 - 07/09/2017 Hospital Admission    She was admitted for management of malignant ascites      07/08/2017 Procedure    Successful ultrasound-guided therapeutic paracentesis yielding 2.7 liters liters of peritoneal fluid      07/12/2017 Procedure    Successful ultrasound-guided paracentesis yielding 1450 mL of peritoneal fluid      07/18/2017 - 07/24/2017 Hospital Admission    She was admitted for expedited treatment      07/18/2017 Tumor Marker    Patient's tumor was tested for the following markers: CA125 Results of the tumor marker test revealed 1941      07/19/2017 - 11/04/2017 Chemotherapy    The patient had 6 cycles of carboplatin & Taxol for chemotherapy treatment.        08/06/2017 Procedure    Successful ultrasound-guided  therapeutic paracentesis yielding 2.6 liters of peritoneal fluid.      08/09/2017 Tumor Marker    Patient's tumor was tested for the following markers: CA125 Results of the tumor marker test revealed 1665      08/15/2017 Tumor Marker    Patient's tumor was tested for the following markers: CA125 Results of the tumor marker test revealed 937.9      08/20/2017 Imaging    ECHO: Normal LV size with EF 60-65%. Normal RV size and systolic function. No significant valvular abnormalities.      09/18/2017 Imaging    Chest Impression:  1. No evidence thoracic metastasis. 2. Interval increase and RIGHT pleural effusion.  Abdomen / Pelvis Impression:  1. Interval decrease in intraperitoneal free fluid. 2. Interval decrease in omental nodularity in the LEFT ventral peritoneal space. 3. Interval decrease in nodularity associated with the LEFT ovary. 4. Cystic portion of  the RIGHT ovary is increased mildly in size.      09/20/2017 Tumor Marker    Patient's tumor was tested for the following markers: CA125 Results of the tumor marker test revealed 347      10/14/2017 Tumor Marker    Patient's tumor was tested for the following markers: CA125 Results of the tumor marker test revealed 307.4      11/04/2017 Tumor Marker    Patient's tumor was tested for the following markers: CA125 Results of the tumor marker test revealed 262.5      11/28/2017 Imaging    1. Interval decrease in right pleural effusion with resolution of right atelectasis seen previously. 2. New small left pleural effusion, symmetric to the right. 3. No intraperitoneal free fluid on the current study. 4. Continued further decrease in left omental disease, appearing less confluent today than on the prior study. 5. Left ovary remains normal in appearance today and the right adnexal cystic lesion is decreased in size compared to prior study. 6. 14 mm pancreatic cyst is unchanged. Continued attention on follow-up imaging  recommended. 7. Aortic Atherosclerois (ICD10-170.0)      12/13/2017 Tumor Marker    Patient's tumor was tested for the following markers: CA125 Results of the tumor marker test revealed 197.7      01/03/2018 Tumor Marker    Patient's tumor was tested for the following markers: CA125 Results of the tumor marker test revealed 183.1      01/14/2018 Tumor Marker    Patient's tumor was tested for the following markers: CA125 Results of the tumor marker test revealed 177.4      02/04/2018 Tumor Marker    Patient's tumor was tested for the following markers: CA125 Results of the tumor marker test revealed 168.5      02/25/2018 Imaging    1. Omental carcinomatosis appears qualitatively stable to slightly decreased. Stable mild peritoneal thickening in the paracolic gutters. Stable right adnexal cyst. No ascites. No new or progressive metastatic disease in the abdomen or pelvis. 2. Small dependent right pleural effusion is increased. 3. Cystic pancreatic body lesion is decreased and now subcentimeter in size, suggesting a benign lesion. 4. Aortic Atherosclerosis (ICD10-I70.0).      Current Meds:  Outpatient Encounter Medications as of 03/10/2018  Medication Sig  . albuterol (PROAIR HFA) 108 (90 Base) MCG/ACT inhaler Inhale 2 puffs into the lungs every 6 (six) hours as needed for wheezing or shortness of breath.  . ALPRAZolam (XANAX) 0.25 MG tablet Take 1 tablet (0.25 mg total) by mouth at bedtime as needed for anxiety.  Marland Kitchen azelastine (ASTELIN) 0.1 % nasal spray Place 2 sprays into both nostrils 2 (two) times daily as needed for allergies.   . Cholecalciferol (VITAMIN D) 2000 units CAPS Take 2,000 Units by mouth daily.  Marland Kitchen diltiazem (CARDIZEM CD) 120 MG 24 hr capsule Take 1 capsule (120 mg total) by mouth every evening.  . diltiazem (CARDIZEM CD) 180 MG 24 hr capsule Take 1 capsule (180 mg total) by mouth every morning.  . diphenhydrAMINE (BENADRYL) 25 mg capsule Take 25 mg by mouth every 6  (six) hours as needed for itching.  . diphenoxylate-atropine (LOMOTIL) 2.5-0.025 MG tablet Take 1 tablet by mouth 4 (four) times daily. (Patient taking differently: Take 1 tablet by mouth 4 (four) times daily as needed for diarrhea or loose stools. )  . DULoxetine (CYMBALTA) 30 MG capsule TAKE 1 CAPSULE(30 MG) BY MOUTH DAILY  . fluticasone (FLONASE) 50 MCG/ACT nasal spray Place 2  sprays into both nostrils 2 (two) times daily. For nasal congestion  . furosemide (LASIX) 20 MG tablet Take 1 tablet (20 mg total) by mouth daily.  Marland Kitchen HYDROcodone-acetaminophen (NORCO) 10-325 MG tablet Take 1 tablet by mouth every 4 (four) hours as needed. (Patient taking differently: Take 1 tablet by mouth every 4 (four) hours as needed for moderate pain. )  . ipratropium-albuterol (DUONEB) 0.5-2.5 (3) MG/3ML SOLN Take 3 mLs by nebulization 3 (three) times daily. (Patient taking differently: Take 3 mLs by nebulization every 6 (six) hours as needed (cough or SOB). As needed)  . levothyroxine (SYNTHROID, LEVOTHROID) 137 MCG tablet Take 137 mcg by mouth every morning. For thyroid therapy  . lidocaine-prilocaine (EMLA) cream Apply 1 application topically as needed. (Patient taking differently: Apply 1 application topically as needed (port). )  . mirtazapine (REMERON) 15 MG tablet TAKE 1 TABLET(15 MG) BY MOUTH AT BEDTIME  . morphine (MS CONTIN) 15 MG 12 hr tablet Take 1 tablet (15 mg total) by mouth every 12 (twelve) hours.  . polyvinyl alcohol (LIQUIFILM TEARS) 1.4 % ophthalmic solution 1 drop 2 (two) times daily as needed for dry eyes.  . potassium chloride (K-DUR) 10 MEQ tablet Take 10 mEq by mouth daily.  . Tiotropium Bromide-Olodaterol (STIOLTO RESPIMAT) 2.5-2.5 MCG/ACT AERS Inhale 2 puffs into the lungs daily.  Derrill Memo ON 03/21/2018] rivaroxaban (XARELTO) 20 MG TABS tablet TAKE 1 TABLET BY MOUTH EVERY DAY WITH SUPPER (Patient not taking: Reported on 03/10/2018)  . [DISCONTINUED] Calcium Carbonate (CALCIUM 600) 1500 MG TABS  Take 2 tablets by mouth daily.     Facility-Administered Encounter Medications as of 03/10/2018  Medication  . fludeoxyglucose F - 18 (FDG) injection 7.8 millicurie    Allergy:  Allergies  Allergen Reactions  . Codeine Itching    Social Hx:   Tobacco Use: Former smoker with 20-pack-year quit in 1982 Alcohol Use: Rare Illicit Drug Use: None   Past Surgical Hx:  Past Surgical History:  Procedure Laterality Date  . COLONOSCOPY    . FEMUR IM NAIL Left 12/11/2013   Procedure: INTRAMEDULLARY (IM) NAIL FEMORAL;  Surgeon: [name], MD;  Location: [facility];  Service: Orthopedics;  Laterality: Left;  . FRACTURE SURGERY Left 12/11/2013   hip  . IR FLUORO GUIDE PORT INSERTION RIGHT  07/22/2017  . IR PARACENTESIS  07/12/2017  . IR US GUIDE VASC ACCESS RIGHT  07/22/2017  . KNEE SURGERY Left    x 2  . LAPAROSCOPIC CHOLECYSTECTOMY  Dec 04, 2010  . TOTAL KNEE REVISION  08/06/2012   Procedure: TOTAL KNEE REVISION;  Surgeon: [name], MD;  Location: [facility];  Service: Orthopedics;  Laterality: Left;  Left Total Knee Arthroplasty Revision  . Uterine polypectomy      Past Medical Hx:  Past Medical History:  Diagnosis Date  . COPD (chronic obstructive pulmonary disease) ([place])   . Depression   . Diverticulosis   . Family hx of colon cancer   . Fibromyalgia   . GERD (gastroesophageal reflux disease)   . Hemorrhoids   . Hiatal hernia   . History of rectal polyps   . History of shingles   . History of uterine polyp   . Hypothyroidism   . IBS (irritable bowel syndrome)   . Lymphocytic colitis   . Osteoporosis   . Ovarian cancer ([place])   . Paroxysmal atrial fibrillation ([facility])   . Psoriatic arthritis ([place])   . Schatzki's ring     Past Gynecological History:  GYNECOLOGIC HISTORY:  No LMP recorded. Patient is postmenopausal. P 1 LMP 40s Contraceptive none HRT none    Family Hx:  Family History  Problem Relation Age of Onset  . Heart disease Father        pacemaker   . Lung cancer Father        hx of smoker  . Colon cancer Mother        dx in her mid 52's  . Colon cancer Maternal Grandfather   . Depression Sister     Review of Systems:  Review of Systems  Constitutional: Positive for fatigue.  Neurological: Positive for numbness.  Psychiatric/Behavioral: Positive for depression.  All other systems reviewed and are negative.   Vitals:  Blood pressure (!) 137/52, pulse 98, temperature 98.1 F (36.7 C), temperature source Oral, resp. rate 20, height '5\' 4"'$  (1.626 m), weight 161 lb 1.6 oz (73.1 kg), SpO2 97 %. Body mass index is 27.65 kg/m.  Physical Exam: ECOG PERFORMANCE STATUS: 1 - Symptomatic but completely ambulatory   General :  Well developed, 84 y.o., female in no apparent distress HEENT:  Normocephalic/atraumatic, symmetric, EOMI, eyelids normal Neck:   Supple, no masses.  Lymphatics:  No cervical/ submandibular/ supraclavicular/ infraclavicular/ inguinal adenopathy Respiratory:  Respirations unlabored, no use of accessory muscles CV:   Deferred Breast:  Deferred Musculoskeletal: No CVA tenderness, normal muscle strength. Abdomen:  Soft, non-tender and nondistended. No evidence of hernia. No masses. Extremities:  No lymphedema, no erythema, non-tender. Skin:   Normal inspection Neuro/Psych:  No focal motor deficit, no abnormal mental status. Normal gait. Normal affect. Alert and oriented to person, place, and time  Genito Urinary: deferred today as patient visit extended and daughter emotional during visit.  Pelvic 03/10/2018 Vulva/vagina: Normal external female genitalia.  No lesions. No discharge or bleeding.             Bladder/urethra:  No lesions or masses, well supported bladder             Vagina: normal             Cervix: Normal appearing, no lesions.             Uterus:  Small, mobile, no parametrial involvement or nodularity.             Adnexa: no palpable masses.  Rectovaginal: No lesions or masses as far as my  distal aspect of the exam.   Oncologic Summary: 1. 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  Prolonged chemotherapy treatment due to poor performance status see details above    Assessment/Plan: 1. Feasibility of surgical resection o From a performance standpoint she seems to have improved performance status and it would be reasonable to proceed with surgical resection o This decision needs to be made taking into consideration the morbidity of the procedure and the goals of treatment I am concerned about some of the findings and we discussed these on her previous visit we reviewed some of the remaining issues today.  Imaging suggesting the right sided pleural effusion is worsening; while on treatment; I recommend we go ahead and have a thoracentesis prior to the procedure  The near plateauing of the CA125  What I think is diaphragmatic disease on the right hemidiaphragm after reviewing several imaging studies dating back to 2012; however her PET scan is not showing any uptake in this region and so hopefully if there is diaphragmatic disease it is not bulky and she would still be optimally resected at  the time of surgery. 2. She has been referred to genetics and has an appointment set up about 3 weeks from now  3. Given the above we did agree on proceeding to laparotomy TAH, BSO and likely omental debulking.  I reviewed the surgical sketch with her today; she was given a copy and one will be placed on the chart. 4. I reviewed the PET personally and in the presence of the patient and her daughter today.   Isabel Caprice, MD  03/11/2018, 9:30 AM   Cc: Asencion Noble, MD (PCP) Rozann Lesches, MD (Cardiology) Scarlette Shorts, MD (Gastroenterology) Baltazar Apo, MD (Pulmonary)

## 2018-03-11 NOTE — Progress Notes (Signed)
Progress Note: Gyn-Onc   CC:  Chief Complaint  Patient presents with  . Malignant neoplasm of ovary, unspecified laterality [facility])    HPI: [name]  is a very nice 84 y.o. P1  . Interval History:   She has been receiving single agent chemotherapy since 12/13/17. She received Cycle 3 on 02/04/18.  Met the patient for the first time 02/27/2018.  At that time on my review of the CT imaging I was concerned about disease on the diaphragm when comparing the scans to several that she had back in as far as 2012.  Given the risk for high morbidity surgery and the patient's clinical scenario I recommended we obtain a PET scan to evaluate for residual metastatic disease prior to offering surgical resection.    Since her last visit she did have an admission for rectal bleeding.  She was transfused and seen by GI.  She has follow-up with the GI physicians later this week.  No her history of atrial fibrillation and she was on Xarelto which is now held.    She has appointment with pulmonary June 24.  Her cardiologist is Dr. Domenic Polite who she recently saw.  Her primary care physician is Dr. Willey Blade who she has not seen for some time.    She presents with her daughter today.  With regard to her response to therapy there appears to be stabilization of the disease. Her CA125 is hovering in the mid 100 range. Her imaging 02/25/18 agrees with probable stable disease since prior on 11/28/17. The left adnexal mass seen on the 06/2017 scan when she was diagnosed is now by my measurements 1.8x3.5cm; previously 3.5 x 2.8cm. The omental cake, compared to 06/2017 seems improved.   I am concerned about some of the findings: 1. Imaging suggesting the right sided pleural effusion is worsening; while on treatment --> we previously discussed this.  This is also present and being read as a moderate pleural effusion on the recent PET scan 2. The near plateauing of the CA125 3. What I think is diaphragmatic disease on the  right hemidiaphragm after reviewing several imaging studies dating back to 2012 --> however the PET scan does not show any abnormal uptake in this region   . Initial Presentation: She was initially seen in consultation 01/2017 at the request of Dr Lynnette Caffey for a 4cm right ovarian cyst and elevated CA 125.  The cyst was first detected on imaging (CT) in 2012 (6 years prior to initial referral) and was approximately 4cm. It was again seen on a CT scan in August, 2017 and was stable in dimension, and described by radiology as likely being benign.   Her CA 125 in May 2017 was 45. It was 80 in August, 2017.   She was scheduled for a repeat CA 125 in March, 13, 2018 this was elevated to 133.5.  It was at this time she was referred to [facility].  The patient denied abdominal bloating or early satiety and she had no vaginal bleeding.  Her major complaint was back pain which has been very bad in the past 6 months and she has been regularly on steroid tapers (most recently completed 2 weeks ago). She has received multiple joint injections for this also.  She takes Xarelto for atrial fibrillation. She has ulcerative colitis. Her bouts have been less prominent lately, likely secondary to prednisone use for her back.  CT scan on 02/07/17 was ordered due to the rising CA 125. It showed  a normal appearance of uterus. A simple appearing cystic lesion is again seen in the right adnexa which measures 3.8 x 3.7cm. This remains stable since 2012, consistent with benign etiology. No other masses or inflammatory process identified.  Given her serious medical comorbidities, including COPD, surgery was not performed for what appeared to be consistent with a benign lesion as the risk were felt to outweigh the benefits.  In August, 2018 she began experiencing increasing abdominal distension and discomfort.  On 07/07/17 she was admitted to [facility] for abdominal distension and SOB. CT imaging showed  interim finding of moderate ascites within the abdomen and pelvis with additional finding of diffuse nodular infiltration of the omentum and anterior mesenteric fat, the appearance would be consistent with peritoneal carcinomatosis/metastatic disease. Increasing retroperitoneal and upper abdominal adenopathy. Re- demonstrated 3.8 cm cyst in the right adnexa. Enlarging soft tissue density in the left adnexa now with possible cystic component posteriorly. In light of the above findings, concern is for ovarian neoplasm. Correlation with pelvic ultrasound recommended. Small right-sided pleural effusion, new since prior study Stable hypodense splenic lesions since 2017.  US guided paracentesis was performed on 07/01/17 which showed adenocarcinoma, favor gynecologic primary. She had multiple paracenteses because her ascites continued to accumulate and caused respiratory distress. In addition she had pleural effusions but these were considered too small for thoracentesis.   She initiated neoadjuvant chemotherapy due to her poor performance status. She underwent chemotherapy with Carbo/Taxol x 6 cycles 07/19/17-11/04/17. She saw Dr. Fermin Schwab after Cycle 3 and due to her poor performance status and continued oxygen requirement was felt not to be a surgical candidate at that time.   She then had PNA and was sent to rehab for a bit. She followed up with Dr. Skeet Latch 12/05/17 with initial thought for reconsideration of interval debulking, but due to the setback from the PNA and continued poor pulmonary function it was recommended against surgery at that time. She was having a difficult time with double-agent chemotherapy; experiencing debilitated state and significant arthralgias. She was changed to single agent Carboplatin receiving the first cycle of that drug 12/13/17 (about 5.5 weeks from her last Carbo/Tax dosing).   Measurement of disease:  Recent Labs    07/18/17 1421 08/09/17 0948 08/15/17 0935  09/20/17 1011 10/14/17 0838 11/04/17 0853 12/13/17 1232 01/03/18 1305 01/14/18 1343 02/04/18 1238  CAN125 1,941.0* 1,665.0* 937.9* 347.0* 307.4* 262.5* 197.7* 183.1* 177.4* 168.5*    Carbo/Taxol C1 07/19/2017 --> C6 11/04/2017 changed to C1 single agent carbo 12/13/2017 --> C3 single agent carbo 02/04/2018   Radiology:  Multiple CT scans in the system reviewed by me personally  03/07/2018-PET-moderate sized right pleural effusion, small left pleural effusion.  No hypermetabolic mediastinal or hilar nodes.  Stable hazy interstitial nodularity in the omentum without discrete measurable nodules or hyper mid to botulism.  No evidence of peritoneal surface disease involving the liver.  No hepatic metastatic disease.  No hypermetabolic abdominal or pelvic lymph nodes.  Stable 4 cm simple appearing right adnexal cyst no hypermetabolic in or around that lesion.  Left hip hardware.    Oncologic History:      Ovarian CA, right ([place])   02/18/2016 Tumor Marker    Patient's tumor was tested for the following markers: CA125 Results of the tumor marker test revealed 45      05/22/2016 Tumor Marker    Patient's tumor was tested for the following markers: CA125 Results of the tumor marker test revealed 53  05/22/2016 Imaging    Outside pelvic US showed 4.1 cm adnexa mass      06/24/2017 Imaging    Ct abdomen and pelvis:  1. Interim finding of moderate ascites within the abdomen and pelvis with additional finding of diffuse nodular infiltration of the omentum and anterior mesenteric fat, the appearance would be consistent with peritoneal carcinomatosis/metastatic disease. Increasing retroperitoneal and upper abdominal adenopathy. 2. Re- demonstrated 3.8 cm cyst in the right adnexa. Enlarging soft tissue density in the left adnexa now with possible cystic component posteriorly. In light of the above findings, concern is for ovarian neoplasm. Correlation with pelvic ultrasound recommended. 3.  Small right-sided pleural effusion, new since prior study 4. Stable hypodense splenic lesions since 2017.       06/25/2017 Imaging    US pelvis: 2.9 cm simple appearing cyst in the right ovary. Left ovary grossly unremarkable. Large volume ascites in the pelvis      06/30/2017 - 07/01/2017 Hospital Admission    She was admitted for evaluation of abdominal pain and ascites      07/01/2017 Pathology Results    PERITONEAL/ASCITIC FLUID(SPECIMEN 1 OF 1 COLLECTED 07/01/17): - POORLY DIFFERENTIATED CARCINOMA; SEE COMMENT Source Peritoneal/Ascitic Fluid, (specimen 1 of 1 collected 07/01/17) Gross Specimen: Received is/are 1000 cc's of brownish fluid. (BS:bs) Prepared: # Smears: 0 # Concentration Technique Slides (i.e. ThinPrep): 1 # Cell Block: 1 Additional Studies: Also received Hematology slide - [id number]. Comment The tumor cells are positive for cytokeratin 7 and Pax-8 but negative for cytokeratin 20, CDX-2, GATA-3, Napsin-A and TTF-1. Based on the immunoprofile a gynecology primary is favored      07/01/2017 Procedure    Successful ultrasound-guided diagnostic and therapeutic paracentesis yielding 2.5 liters of peritoneal fluid      07/07/2017 - 07/09/2017 Hospital Admission    She was admitted for management of malignant ascites      07/08/2017 Procedure    Successful ultrasound-guided therapeutic paracentesis yielding 2.7 liters liters of peritoneal fluid      07/12/2017 Procedure    Successful ultrasound-guided paracentesis yielding 1450 mL of peritoneal fluid      07/18/2017 - 07/24/2017 Hospital Admission    She was admitted for expedited treatment      07/18/2017 Tumor Marker    Patient's tumor was tested for the following markers: CA125 Results of the tumor marker test revealed 1941      07/19/2017 - 11/04/2017 Chemotherapy    The patient had 6 cycles of carboplatin & Taxol for chemotherapy treatment.        08/06/2017 Procedure    Successful ultrasound-guided  therapeutic paracentesis yielding 2.6 liters of peritoneal fluid.      08/09/2017 Tumor Marker    Patient's tumor was tested for the following markers: CA125 Results of the tumor marker test revealed 1665      08/15/2017 Tumor Marker    Patient's tumor was tested for the following markers: CA125 Results of the tumor marker test revealed 937.9      08/20/2017 Imaging    ECHO: Normal LV size with EF 60-65%. Normal RV size and systolic function. No significant valvular abnormalities.      09/18/2017 Imaging    Chest Impression:  1. No evidence thoracic metastasis. 2. Interval increase and RIGHT pleural effusion.  Abdomen / Pelvis Impression:  1. Interval decrease in intraperitoneal free fluid. 2. Interval decrease in omental nodularity in the LEFT ventral peritoneal space. 3. Interval decrease in nodularity associated with the LEFT ovary. 4. Cystic portion of  the RIGHT ovary is increased mildly in size.      09/20/2017 Tumor Marker    Patient's tumor was tested for the following markers: CA125 Results of the tumor marker test revealed 347      10/14/2017 Tumor Marker    Patient's tumor was tested for the following markers: CA125 Results of the tumor marker test revealed 307.4      11/04/2017 Tumor Marker    Patient's tumor was tested for the following markers: CA125 Results of the tumor marker test revealed 262.5      11/28/2017 Imaging    1. Interval decrease in right pleural effusion with resolution of right atelectasis seen previously. 2. New small left pleural effusion, symmetric to the right. 3. No intraperitoneal free fluid on the current study. 4. Continued further decrease in left omental disease, appearing less confluent today than on the prior study. 5. Left ovary remains normal in appearance today and the right adnexal cystic lesion is decreased in size compared to prior study. 6. 14 mm pancreatic cyst is unchanged. Continued attention on follow-up imaging  recommended. 7. Aortic Atherosclerois (ICD10-170.0)      12/13/2017 Tumor Marker    Patient's tumor was tested for the following markers: CA125 Results of the tumor marker test revealed 197.7      01/03/2018 Tumor Marker    Patient's tumor was tested for the following markers: CA125 Results of the tumor marker test revealed 183.1      01/14/2018 Tumor Marker    Patient's tumor was tested for the following markers: CA125 Results of the tumor marker test revealed 177.4      02/04/2018 Tumor Marker    Patient's tumor was tested for the following markers: CA125 Results of the tumor marker test revealed 168.5      02/25/2018 Imaging    1. Omental carcinomatosis appears qualitatively stable to slightly decreased. Stable mild peritoneal thickening in the paracolic gutters. Stable right adnexal cyst. No ascites. No new or progressive metastatic disease in the abdomen or pelvis. 2. Small dependent right pleural effusion is increased. 3. Cystic pancreatic body lesion is decreased and now subcentimeter in size, suggesting a benign lesion. 4. Aortic Atherosclerosis (ICD10-I70.0).      Current Meds:  Outpatient Encounter Medications as of 03/10/2018  Medication Sig  . albuterol (PROAIR HFA) 108 (90 Base) MCG/ACT inhaler Inhale 2 puffs into the lungs every 6 (six) hours as needed for wheezing or shortness of breath.  . ALPRAZolam (XANAX) 0.25 MG tablet Take 1 tablet (0.25 mg total) by mouth at bedtime as needed for anxiety.  Marland Kitchen azelastine (ASTELIN) 0.1 % nasal spray Place 2 sprays into both nostrils 2 (two) times daily as needed for allergies.   . Cholecalciferol (VITAMIN D) 2000 units CAPS Take 2,000 Units by mouth daily.  Marland Kitchen diltiazem (CARDIZEM CD) 120 MG 24 hr capsule Take 1 capsule (120 mg total) by mouth every evening.  . diltiazem (CARDIZEM CD) 180 MG 24 hr capsule Take 1 capsule (180 mg total) by mouth every morning.  . diphenhydrAMINE (BENADRYL) 25 mg capsule Take 25 mg by mouth every 6  (six) hours as needed for itching.  . diphenoxylate-atropine (LOMOTIL) 2.5-0.025 MG tablet Take 1 tablet by mouth 4 (four) times daily. (Patient taking differently: Take 1 tablet by mouth 4 (four) times daily as needed for diarrhea or loose stools. )  . DULoxetine (CYMBALTA) 30 MG capsule TAKE 1 CAPSULE(30 MG) BY MOUTH DAILY  . fluticasone (FLONASE) 50 MCG/ACT nasal spray Place 2  sprays into both nostrils 2 (two) times daily. For nasal congestion  . furosemide (LASIX) 20 MG tablet Take 1 tablet (20 mg total) by mouth daily.  Marland Kitchen HYDROcodone-acetaminophen (NORCO) 10-325 MG tablet Take 1 tablet by mouth every 4 (four) hours as needed. (Patient taking differently: Take 1 tablet by mouth every 4 (four) hours as needed for moderate pain. )  . ipratropium-albuterol (DUONEB) 0.5-2.5 (3) MG/3ML SOLN Take 3 mLs by nebulization 3 (three) times daily. (Patient taking differently: Take 3 mLs by nebulization every 6 (six) hours as needed (cough or SOB). As needed)  . levothyroxine (SYNTHROID, LEVOTHROID) 137 MCG tablet Take 137 mcg by mouth every morning. For thyroid therapy  . lidocaine-prilocaine (EMLA) cream Apply 1 application topically as needed. (Patient taking differently: Apply 1 application topically as needed (port). )  . mirtazapine (REMERON) 15 MG tablet TAKE 1 TABLET(15 MG) BY MOUTH AT BEDTIME  . morphine (MS CONTIN) 15 MG 12 hr tablet Take 1 tablet (15 mg total) by mouth every 12 (twelve) hours.  . polyvinyl alcohol (LIQUIFILM TEARS) 1.4 % ophthalmic solution 1 drop 2 (two) times daily as needed for dry eyes.  . potassium chloride (K-DUR) 10 MEQ tablet Take 10 mEq by mouth daily.  . Tiotropium Bromide-Olodaterol (STIOLTO RESPIMAT) 2.5-2.5 MCG/ACT AERS Inhale 2 puffs into the lungs daily.  Derrill Memo ON 03/21/2018] rivaroxaban (XARELTO) 20 MG TABS tablet TAKE 1 TABLET BY MOUTH EVERY DAY WITH SUPPER (Patient not taking: Reported on 03/10/2018)  . [DISCONTINUED] Calcium Carbonate (CALCIUM 600) 1500 MG TABS  Take 2 tablets by mouth daily.     Facility-Administered Encounter Medications as of 03/10/2018  Medication  . fludeoxyglucose F - 18 (FDG) injection 7.8 millicurie    Allergy:  Allergies  Allergen Reactions  . Codeine Itching    Social Hx:   Tobacco Use: Former smoker with 20-pack-year quit in 1982 Alcohol Use: Rare Illicit Drug Use: None   Past Surgical Hx:  Past Surgical History:  Procedure Laterality Date  . COLONOSCOPY    . FEMUR IM NAIL Left 12/11/2013   Procedure: INTRAMEDULLARY (IM) NAIL FEMORAL;  Surgeon: [name], MD;  Location: [facility];  Service: Orthopedics;  Laterality: Left;  . FRACTURE SURGERY Left 12/11/2013   hip  . IR FLUORO GUIDE PORT INSERTION RIGHT  07/22/2017  . IR PARACENTESIS  07/12/2017  . IR US GUIDE VASC ACCESS RIGHT  07/22/2017  . KNEE SURGERY Left    x 2  . LAPAROSCOPIC CHOLECYSTECTOMY  Dec 04, 2010  . TOTAL KNEE REVISION  08/06/2012   Procedure: TOTAL KNEE REVISION;  Surgeon: [name], MD;  Location: [facility];  Service: Orthopedics;  Laterality: Left;  Left Total Knee Arthroplasty Revision  . Uterine polypectomy      Past Medical Hx:  Past Medical History:  Diagnosis Date  . COPD (chronic obstructive pulmonary disease) ([place])   . Depression   . Diverticulosis   . Family hx of colon cancer   . Fibromyalgia   . GERD (gastroesophageal reflux disease)   . Hemorrhoids   . Hiatal hernia   . History of rectal polyps   . History of shingles   . History of uterine polyp   . Hypothyroidism   . IBS (irritable bowel syndrome)   . Lymphocytic colitis   . Osteoporosis   . Ovarian cancer ([place])   . Paroxysmal atrial fibrillation ([facility])   . Psoriatic arthritis ([place])   . Schatzki's ring     Past Gynecological History:  GYNECOLOGIC HISTORY:  No LMP recorded. Patient is postmenopausal. P 1 LMP 40s Contraceptive none HRT none    Family Hx:  Family History  Problem Relation Age of Onset  . Heart disease Father        pacemaker   . Lung cancer Father        hx of smoker  . Colon cancer Mother        dx in her mid 67's  . Colon cancer Maternal Grandfather   . Depression Sister     Review of Systems:  Review of Systems  Constitutional: Positive for fatigue.  Neurological: Positive for numbness.  Psychiatric/Behavioral: Positive for depression.  All other systems reviewed and are negative.   Vitals:  Blood pressure (!) 137/52, pulse 98, temperature 98.1 F (36.7 C), temperature source Oral, resp. rate 20, height '5\' 4"'$  (1.626 m), weight 161 lb 1.6 oz (73.1 kg), SpO2 97 %. Body mass index is 27.65 kg/m.  Physical Exam: ECOG PERFORMANCE STATUS: 1 - Symptomatic but completely ambulatory   General :  Well developed, 84 y.o., female in no apparent distress HEENT:  Normocephalic/atraumatic, symmetric, EOMI, eyelids normal Neck:   Supple, no masses.  Lymphatics:  No cervical/ submandibular/ supraclavicular/ infraclavicular/ inguinal adenopathy Respiratory:  Respirations unlabored, no use of accessory muscles CV:   Deferred Breast:  Deferred Musculoskeletal: No CVA tenderness, normal muscle strength. Abdomen:  Soft, non-tender and nondistended. No evidence of hernia. No masses. Extremities:  No lymphedema, no erythema, non-tender. Skin:   Normal inspection Neuro/Psych:  No focal motor deficit, no abnormal mental status. Normal gait. Normal affect. Alert and oriented to person, place, and time  Genito Urinary: deferred today as patient visit extended and daughter emotional during visit.  Pelvic 03/10/2018 Vulva/vagina: Normal external female genitalia.  No lesions. No discharge or bleeding.             Bladder/urethra:  No lesions or masses, well supported bladder             Vagina: normal             Cervix: Normal appearing, no lesions.             Uterus:  Small, mobile, no parametrial involvement or nodularity.             Adnexa: no palpable masses.  Rectovaginal: No lesions or masses as far as my  distal aspect of the exam.   Oncologic Summary: 1. 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  Prolonged chemotherapy treatment due to poor performance status see details above    Assessment/Plan: 1. Feasibility of surgical resection o From a performance standpoint she seems to have improved performance status and it would be reasonable to proceed with surgical resection o This decision needs to be made taking into consideration the morbidity of the procedure and the goals of treatment I am concerned about some of the findings and we discussed these on her previous visit we reviewed some of the remaining issues today.  Imaging suggesting the right sided pleural effusion is worsening; while on treatment; I recommend we go ahead and have a thoracentesis prior to the procedure  The near plateauing of the CA125  What I think is diaphragmatic disease on the right hemidiaphragm after reviewing several imaging studies dating back to 2012; however her PET scan is not showing any uptake in this region and so hopefully if there is diaphragmatic disease it is not bulky and she would still be optimally resected at  the time of surgery. 2. She has been referred to genetics and has an appointment set up about 3 weeks from now  3. Given the above we did agree on proceeding to laparotomy TAH, BSO and likely omental debulking.  I reviewed the surgical sketch with her today; she was given a copy and one will be placed on the chart. 4. I reviewed the PET personally and in the presence of the patient and her daughter today.   Isabel Caprice, MD  03/11/2018, 9:30 AM   Cc: Asencion Noble, MD (PCP) Rozann Lesches, MD (Cardiology) Scarlette Shorts, MD (Gastroenterology) Baltazar Apo, MD (Pulmonary)

## 2018-03-11 NOTE — Telephone Encounter (Signed)
Diane called back regarding rehab order. Advised per note that we will hold off on pulmonary rehab until patient is seen. Patient is scheduled with [facility] on 03/31/18 - Diane will notate that rehab will be re-addressed at that time, she needs nothing further, if necessary Diane can be reached at [phone], ask for her when calling she is the manager of that office.-pr

## 2018-03-12 ENCOUNTER — Ambulatory Visit (HOSPITAL_COMMUNITY): Payer: Medicare Other | Attending: Hematology and Oncology | Admitting: Physical Therapy

## 2018-03-12 ENCOUNTER — Other Ambulatory Visit: Payer: Self-pay | Admitting: Hematology and Oncology

## 2018-03-12 ENCOUNTER — Encounter (HOSPITAL_COMMUNITY): Payer: Self-pay | Admitting: Physical Therapy

## 2018-03-12 ENCOUNTER — Encounter: Payer: Self-pay | Admitting: Physician Assistant

## 2018-03-12 ENCOUNTER — Ambulatory Visit (INDEPENDENT_AMBULATORY_CARE_PROVIDER_SITE_OTHER): Payer: Medicare Other | Admitting: Physician Assistant

## 2018-03-12 DIAGNOSIS — Z8719 Personal history of other diseases of the digestive system: Secondary | ICD-10-CM | POA: Insufficient documentation

## 2018-03-12 DIAGNOSIS — R2689 Other abnormalities of gait and mobility: Secondary | ICD-10-CM

## 2018-03-12 DIAGNOSIS — K922 Gastrointestinal hemorrhage, unspecified: Secondary | ICD-10-CM

## 2018-03-12 DIAGNOSIS — M79662 Pain in left lower leg: Secondary | ICD-10-CM | POA: Insufficient documentation

## 2018-03-12 DIAGNOSIS — M6281 Muscle weakness (generalized): Secondary | ICD-10-CM | POA: Insufficient documentation

## 2018-03-12 DIAGNOSIS — C561 Malignant neoplasm of right ovary: Secondary | ICD-10-CM

## 2018-03-12 DIAGNOSIS — D638 Anemia in other chronic diseases classified elsewhere: Secondary | ICD-10-CM

## 2018-03-12 LAB — CYTOLOGY - PAP: Diagnosis: NEGATIVE

## 2018-03-12 MED ORDER — BUDESONIDE ER 9 MG PO TB24: 1.0000 | ORAL_TABLET | Freq: Every day | ORAL | 3 refills | Status: DC

## 2018-03-12 MED ORDER — DICYCLOMINE HCL 10 MG PO CAPS: ORAL_CAPSULE | ORAL | 6 refills | Status: DC

## 2018-03-12 NOTE — Patient Instructions (Addendum)
We sent prescriptions to your pharmacy: [facility] [address] 1. Bentyl ( dicyclomine) 10 mg. 2. Uceris ( Budesonide) 9 mg After you do start the Uceris- call us one month later with a progress report.  Call [phone], choose option 2 and ask for Amy's nurse.

## 2018-03-12 NOTE — Patient Instructions (Signed)
  Repeat 10 Times Hold 1 Second Complete 2 Sets Perform 1 Time(s) a Day HIP EXTENSION - STANDING While standing, balance on one leg and move your other leg in a backward direction. Do not swing the leg. Perform smooth and controlled movements. Keep your trunk stable and without arching during the movement. Use your arms for support if needed for balance and safety.  SIT TO STAND - NO SUPPORT Start by scooting close to the front of the chair. Next, lean forward at your trunk and reach forward with your arms and rise to standing without using your hands to push off from the chair or other object. Use your arms as a counter-balance by reaching forward when in sitting and lower them as you approach standing. Repeat 10 Times Hold 1 Second Complete 2 Sets Perform 1 Time(s) a Day

## 2018-03-12 NOTE — Progress Notes (Signed)
Subjective:    Patient ID: [name], female    DOB: Oct 14, 1932, 84 y.o.   MRN: [id number]  HPI [name] is a very nice 84 year old white female, known to Dr. Henrene Pastor.  She comes in today for post hospital follow-up after recent hospitalization last week with an acute diverticular bleed.  Patient is on chronic anticoagulation with Xarelto for atrial fibrillation.  She is also currently undergoing chemotherapy for ovarian cancer with plans for upcoming surgery.  Other medical problems include lymphocytic colitis, COPD, congestive heart failure with EF of 60% and atrial fibrillation.  Patient was admitted on 03/03/2018 after she had had about 4 episodes of large-volume dark red blood which was painless.  She had been being monitored for mild pancytopenia due to her chemotherapy and hemoglobin prior to hospitalization was 11.1.  Hemoglobin was 9.2 on admission.  She did drift down closer to the 8 range and did require transfusion. Patient last had colonoscopy in 2011 with documented left colon diverticulosis and internal hemorrhoids. She did not undergo repeat colonoscopy during her admission.  Her bleeding stopped after the first 48 hours. Hemoglobin at the time of discharge on 03/07/2018 was 8.9. Decision was made to keep patient off of Xarelto for about 2 weeks. She underwent PET scan on 03/07/2018 which had been previously scheduled to follow-up the metastatic ovarian cancer.  She was noted to have a moderate right effusion, small left effusion and stable hazy and hazy nodularity of the omentum. Patient is done well since discharge and has had no further bleeding.  She says she continues to feel a bit fatigued and is short of breath with much exertion.  She has not had no further dark stools or obvious blood. She has been told to stay off of Xarelto for now as she is scheduled for thoracentesis on Monday, and then according to the patient's daughter she is scheduled for surgery on 70 per Dr.  Gerarda Fraction. She is to have multiple labs done tomorrow occluding iron studies, and is tentatively scheduled for an iron infusion tomorrow as well.  Patient does ask today about any treatment for her lymphocytic colitis.  Apparently she had been given a prescription for budesonide in the past but never did get this filled as it was too expensive. She continues to have 5-6 loose bowel movements on a bad day and at least 2-3 loose bowel movements on a good day.  Generally does not have any cramping or pain but says she never has any formed to her stool and has a lot of difficulty with urgency for bowel movements. She is tried Imodium in the past without much benefit.  She would like to try budesonide.  Review of Systems Pertinent positive and negative review of systems were noted in the above HPI section.  All other review of systems was otherwise negative.  Outpatient Encounter Medications as of 03/12/2018  Medication Sig  . albuterol (PROAIR HFA) 108 (90 Base) MCG/ACT inhaler Inhale 2 puffs into the lungs every 6 (six) hours as needed for wheezing or shortness of breath.  . ALPRAZolam (XANAX) 0.25 MG tablet Take 1 tablet (0.25 mg total) by mouth at bedtime as needed for anxiety.  Marland Kitchen azelastine (ASTELIN) 0.1 % nasal spray Place 2 sprays into both nostrils 2 (two) times daily as needed for allergies.   . Cholecalciferol (VITAMIN D) 2000 units CAPS Take 2,000 Units by mouth daily.  Marland Kitchen diltiazem (CARDIZEM CD) 120 MG 24 hr capsule Take 1 capsule (120 mg total)  by mouth every evening.  . diltiazem (CARDIZEM CD) 180 MG 24 hr capsule Take 1 capsule (180 mg total) by mouth every morning.  . diphenhydrAMINE (BENADRYL) 25 mg capsule Take 25 mg by mouth every 6 (six) hours as needed for itching.  . diphenoxylate-atropine (LOMOTIL) 2.5-0.025 MG tablet Take 1 tablet by mouth 4 (four) times daily. (Patient taking differently: Take 1 tablet by mouth 4 (four) times daily as needed for diarrhea or loose stools. )  .  DULoxetine (CYMBALTA) 30 MG capsule TAKE 1 CAPSULE(30 MG) BY MOUTH DAILY (Patient taking differently: TAKE 1 CAPSULE(30 MG) BY MOUTH DAILY AT NIGHT.)  . fluticasone (FLONASE) 50 MCG/ACT nasal spray Place 2 sprays into both nostrils 2 (two) times daily as needed (FOR NASAL CONGESTION.).   Marland Kitchen furosemide (LASIX) 20 MG tablet Take 1 tablet (20 mg total) by mouth daily.  Marland Kitchen HYDROcodone-acetaminophen (NORCO) 10-325 MG tablet Take 1 tablet by mouth every 4 (four) hours as needed. (Patient taking differently: Take 1 tablet by mouth every 4 (four) hours as needed for moderate pain. )  . ipratropium-albuterol (DUONEB) 0.5-2.5 (3) MG/3ML SOLN Take 3 mLs by nebulization 3 (three) times daily. (Patient taking differently: Take 3 mLs by nebulization every 6 (six) hours as needed (cough or SOB). As needed)  . levothyroxine (SYNTHROID, LEVOTHROID) 137 MCG tablet Take 137 mcg by mouth daily before breakfast. For thyroid therapy  . lidocaine-prilocaine (EMLA) cream Apply 1 application topically as needed. (Patient taking differently: Apply 1 application topically as needed (port). )  . mirtazapine (REMERON) 15 MG tablet TAKE 1 TABLET(15 MG) BY MOUTH AT BEDTIME  . morphine (MS CONTIN) 15 MG 12 hr tablet Take 1 tablet (15 mg total) by mouth every 12 (twelve) hours. (Patient taking differently: Take 15 mg by mouth every 12 (twelve) hours as needed (FOR PAIN.). )  . ondansetron (ZOFRAN) 8 MG tablet Take 8 mg by mouth every 8 (eight) hours as needed for nausea or vomiting.  . polyvinyl alcohol (LIQUIFILM TEARS) 1.4 % ophthalmic solution 1 drop 2 (two) times daily as needed for dry eyes.  . potassium chloride (K-DUR) 10 MEQ tablet Take 10 mEq by mouth daily.  . Tiotropium Bromide-Olodaterol (STIOLTO RESPIMAT) 2.5-2.5 MCG/ACT AERS Inhale 2 puffs into the lungs daily.  . Budesonide ER (UCERIS) 9 MG TB24 Take 1 tablet by mouth daily.  Marland Kitchen dicyclomine (BENTYL) 10 MG capsule Take 1 tab by mouth every morning. May take twice daily as  needed.  Derrill Memo ON 03/21/2018] rivaroxaban (XARELTO) 20 MG TABS tablet TAKE 1 TABLET BY MOUTH EVERY DAY WITH SUPPER (Patient not taking: Reported on 03/10/2018)  . [DISCONTINUED] Calcium Carbonate (CALCIUM 600) 1500 MG TABS Take 2 tablets by mouth daily.     Facility-Administered Encounter Medications as of 03/12/2018  Medication  . fludeoxyglucose F - 18 (FDG) injection 7.8 millicurie   Allergies  Allergen Reactions  . Codeine Itching   Patient Active Problem List   Diagnosis Date Noted  . History of GI diverticular bleed 03/12/2018  . Lower GI bleed   . Diverticulosis of colon with hemorrhage   . Rectal bleeding 03/04/2018  . Acute GI bleeding 03/04/2018  . Thrombocytopenia ([place]) 01/03/2018  . Anemia, chronic disease 12/13/2017  . Mild protein-calorie malnutrition ([place]) 12/13/2017  . Peripheral neuropathy due to chemotherapy ([place]) 12/13/2017  . S/P thoracentesis   . Atrial fibrillation with RVR ([place]) 11/08/2017  . HCAP (healthcare-associated pneumonia) 11/08/2017  . Pleural effusion 09/20/2017  . Acute on chronic diastolic heart failure ([place])  08/19/2017  . Pancytopenia, acquired ([place]) 08/15/2017  . Protein-calorie malnutrition, moderate ([place]) 07/30/2017  . Generalized weakness 07/30/2017  . Antineoplastic chemotherapy induced pancytopenia ([place]) 07/20/2017  . Ovarian CA, right ([place]) 07/18/2017  . PNA (pneumonia) 07/07/2017  . Ascites 06/30/2017  . Ascites, malignant 06/30/2017  . Right ovarian cyst 01/18/2017  . Elevated CA-125 01/18/2017  . Depression 12/23/2015  . Peripheral edema 08/23/2015  . Fracture of hip, left, closed ([place]) 12/08/2013  . Hip fracture ([place]) 12/08/2013  . PAD (peripheral artery disease) ([place]) 11/25/2013  . Encounter for therapeutic drug monitoring 11/09/2013  . Cough 03/20/2013  . Total knee replacement status 10/07/2012  . Knee pain 10/07/2012  . Knee stiffness 10/07/2012  . Tachycardia 09/17/2012  . Chest pain 09/17/2012  . Difficulty in  walking(719.7) 09/16/2012  . Muscle weakness (generalized) 09/16/2012  . Postop Acute blood loss anemia 08/08/2012  . Instability of prosthetic knee ([place]) 08/06/2012  . Bloating 02/14/2012  . Upper abdominal pain 02/14/2012  . Allergic rhinitis, seasonal 02/15/2011  . COLITIS 02/20/2010  . Diarrhea 01/02/2010  . ABDOMINAL PAIN -GENERALIZED 01/02/2010  . PERSONAL HX COLONIC POLYPS 01/02/2010  . Hypothyroidism 12/28/2009  . COPD (chronic obstructive pulmonary disease) ([place]) 12/28/2009  . ARTHRITIS 12/28/2009   Social History   Socioeconomic History  . Marital status: Divorced    Spouse name: Not on file  . Number of children: 1  . Years of education: 7  . Highest education level: GED or equivalent  Occupational History  . Occupation: retired    Fish farm manager: RETIRED  Social Needs  . Financial resource strain: Not hard at all  . Food insecurity:    Worry: Never true    Inability: Never true  . Transportation needs:    Medical: No    Non-medical: No  Tobacco Use  . Smoking status: Former Smoker    Packs/day: 1.00    Years: 20.00    Pack years: 20.00    Types: Cigarettes    Last attempt to quit: 10/08/1980    Years since quitting: 37.4  . Smokeless tobacco: Never Used  Substance and Sexual Activity  . Alcohol use: Yes    Comment: rarely, 12-23-15 rarely  . Drug use: No  . Sexual activity: Not Currently  Lifestyle  . Physical activity:    Days per week: 0 days    Minutes per session: 0 min  . Stress: Not at all  Relationships  . Social connections:    Talks on phone: More than three times a week    Gets together: Three times a week    Attends religious service: More than 4 times per year    Active member of club or organization: No    Attends meetings of clubs or organizations: Never    Relationship status: Divorced  . Intimate partner violence:    Fear of current or ex partner: No    Emotionally abused: No    Physically abused: No    Forced sexual activity: No   Other Topics Concern  . Not on file  Social History Narrative   Patient lives at home by herself.    Patient is retired.    Patient has 12 th grade education.           Ms. Tucci's family history includes Colon cancer in her maternal grandfather and mother; Depression in her sister; Heart disease in her father; Lung cancer in her father.      Objective:    Vitals:   03/12/18  1502  BP: 126/60  Pulse: 72    Physical Exam; well-developed elderly white female in no acute distress, accompanied by her daughter both very pleasant blood pressure 126/60 pulse 72, height 5 foot 4, weight 165, BMI 28.3.  HEENT; nontraumatic normocephalic EOMI PERRLA sclera anicteric, Cardiovascular; irregular rate and rhythm with S1-S2 Pulmonary clear bilaterally somewhat decreased breath sounds on the right.  Abdomen ;soft, nontender bowel sounds are active no definite palpable mass on the Rectal ;exam not done, Extremities ;no clubbing cyanosis or edema skin warm and dry, Neuro psych; alert and oriented, grossly nonfocal mood and affect appropriate       Assessment & Plan:   #73 84 year old white female with metastatic ovarian cancer with carcinomatosis undergoing active treatment and scheduled for surgery on 03/20/2018. Patient also recently diagnosed per PET scan with a moderate right pleural effusion and is scheduled for thoracentesis next week. Seen in the office today in post hospital follow-up after diverticular hemorrhage for which she was hospitalized last week. She was managed conservatively, did not undergo repeat colonoscopy as she has previously documented diverticulosis.  Her bleed had occurred in the setting of Xarelto.  Patient has had no further bleeding since discharge from the hospital and has remained off Xarelto.  #2 anemia multifactorial secondary to diverticular hemorrhage and relative pancytopenia with chemotherapy #3 history of atrial fibrillation #4 chronic anticoagulation-on  Xarelto currently on hold #5 lymphocytic colitis-has not been treated has had persistent mild to moderate symptoms   Plan; patient has multiple labs ordered for tomorrow including CBC.  We will follow-up hemoglobin.  She is being managed by oncology currently, and will be transfused and receive iron infusion as needed prior to her upcoming surgery. From a GI perspective she would be okay to resume Xarelto next week but this is currently on hold for upcoming thoracentesis and surgery. We discussed trial of dicyclomine today 10 mg p.o. every morning and may take a second dose mid afternoon as needed to help control her diarrhea and urgency Will also send a prescription for you serous 9 mg p.o. every morning.  I discussed the possibility that this may be prohibitively expensive and if so to call and we will see if we can help with any sort of prior authorization etc.  I instructed her not to start budesonide until she is recovered from her upcoming surgery.  After she starts budesonide have asked them to call the progress report about 4 weeks later and then can decide on dosing and/or tapering. She will follow-up with Dr. Henrene Pastor or myself on an as-needed basis.  Amy S Esterwood PA-C 03/12/2018   Cc: Asencion Noble, MD

## 2018-03-12 NOTE — Telephone Encounter (Addendum)
Called and spoke to pt. Pt confirmed that she has been off of Xarelto for over a week. I have advised pt to continue holding Xarelto for thoracentesis. Pt voiced her understanding and had no further questions. Nothing further is needed.   Will route to TP as an FYI.

## 2018-03-12 NOTE — Therapy (Signed)
[facility] [address] [place], [place], [address] Phone: [phone]   Fax:  [phone]  Physical Therapy Treatment / Re-assessment / Discharge Summary  Patient Details  Name: [name] MRN: [id number] Date of Birth: Apr 01, 1933 Referring Provider: Dr. Heath Lark   Encounter Date: 03/12/2018   Progress Note Reporting Period 01/31/18 to 03/12/18  See note below for Objective Data and Assessment of Progress/Goals.       PT End of Session - 03/12/18 1505    Visit Number  26    Number of Visits  30    Date for PT Re-Evaluation  02/28/18    Authorization Type  Medicare    Authorization Time Period  12/23/17 to 02/03/18; NEW: 01/31/18 to 02/28/18    PT Start Time  1032    PT Stop Time  1113 Some time unbilled for re-assessment    PT Time Calculation (min)  41 min    Equipment Utilized During Treatment  Gait belt    Activity Tolerance  Patient tolerated treatment well;Patient limited by fatigue    Behavior During Therapy  WFL for tasks assessed/performed       Past Medical History:  Diagnosis Date  . COPD (chronic obstructive pulmonary disease) ([place])   . Depression   . Diverticulosis   . Family hx of colon cancer   . Fibromyalgia   . GERD (gastroesophageal reflux disease)   . Hemorrhoids   . Hiatal hernia   . History of rectal polyps   . History of shingles   . Hypothyroidism   . IBS (irritable bowel syndrome)   . Lymphocytic colitis   . Osteoporosis   . Ovarian cancer ([place])   . Paroxysmal atrial fibrillation ([facility])   . Psoriatic arthritis ([place])   . Schatzki's ring     Past Surgical History:  Procedure Laterality Date  . COLONOSCOPY    . FEMUR IM NAIL Left 12/11/2013   Procedure: INTRAMEDULLARY (IM) NAIL FEMORAL;  Surgeon: [name], MD;  Location: [facility];  Service: Orthopedics;  Laterality: Left;  . FRACTURE SURGERY Left 12/11/2013   hip  . IR FLUORO GUIDE PORT INSERTION RIGHT  07/22/2017  . IR PARACENTESIS   07/12/2017  . IR US GUIDE VASC ACCESS RIGHT  07/22/2017  . KNEE SURGERY Left    x 2  . LAPAROSCOPIC CHOLECYSTECTOMY  Dec 04, 2010  . TOTAL KNEE REVISION  08/06/2012   Procedure: TOTAL KNEE REVISION;  Surgeon: [name], MD;  Location: [facility];  Service: Orthopedics;  Laterality: Left;  Left Total Knee Arthroplasty Revision  . Uterine polypectomy      There were no vitals filed for this visit.  Subjective Assessment - 03/12/18 1101    Subjective  Patient reported that she is having surgery next week. She stated that after she was hospitalized that she has had a decrease in her functional level. Patient stated that she is ready to be done with therapy for now and that she is hoping she does not need to come back following her surgery.     Limitations  Walking;Standing    How long can you sit comfortably?  not limited    How long can you stand comfortably?  10 minutes    How long can you walk comfortably?  200 feet    Currently in Pain?  Yes    Pain Score  5     Pain Location  Back    Pain Orientation  Right;Left  Pain Descriptors / Indicators  Aching    Pain Type  Chronic pain         [facility] PT Assessment - 03/12/18 0001      Assessment   Medical Diagnosis  Generalized Weakness    Referring Provider  Dr. Heath Lark      Cognition   Overall Cognitive Status  Within Functional Limits for tasks assessed      Sit to Stand   Comments  30sec chair rise test: 10      Strength   Right Hip Flexion  4+/5 was 4+/5    Right Hip Extension  4/5 was 4-/5    Right Hip ABduction  5/5 was 4/5 then 4+/5    Left Hip Flexion  4+/5 was 4+/5    Left Hip Extension  3+/5 was 3+/5    Left Hip ABduction  5/5 was 4/5    Right Knee Flexion  5/5 was 4/5 then 5/5    Right Knee Extension  5/5 was 3/5 then 5/5    Left Knee Flexion  5/5 was 4+/5    Left Knee Extension  5/5 was 4+/5 then 5/5    Right Ankle Dorsiflexion  5/5 was 4+/5 then 5/5    Left Ankle Dorsiflexion  5/5 was 4+/5 then 5/5       Ambulation/Gait   Ambulation Distance (Feet)  200 Feet 3MWT seated after 200 feet     Assistive device  Rolling walker    Gait Pattern  Step-through pattern;Decreased hip/knee flexion - right;Decreased hip/knee flexion - left;Decreased stride length;Trunk flexed    Ambulation Surface  Level      Balance   Balance Assessed  Yes      Static Standing Balance   Static Standing - Balance Support  No upper extremity supported    Static Standing Balance -  Activities   Single Leg Stance - Right Leg;Single Leg Stance - Left Leg    Static Standing - Comment/# of Minutes  SLS Rt. 5.95 seconds; SLS Lt. 12 seconds      Standardized Balance Assessment   Five times sit to stand comments   12 seconds no upper extremities                           PT Education - 03/12/18 1505    Education provided  Yes    Education Details  Patient was educated on examination findings, plan for discharge, about importance of continuing previous exercises and activity and on new exercsies for her HEP.     Person(s) Educated  Patient    Methods  Explanation;Demonstration;Handout    Comprehension  Verbalized understanding       PT Short Term Goals - 03/12/18 1507      PT SHORT TERM GOAL #1   Title  Patient will ambulated >250 feet with rolling walker during the 3 MWT to improve her ability to ambulate further within her home before needing to rest.    Baseline  4/26: 385f, no rest breaks; 03/12/18: This sessin patient ambulated 200 feet with rest break     Time  3    Period  Weeks    Status  Achieved Previously achieved, this session no met      PT SHORT TERM GOAL #2   Title  Patient with tolerate the standing position with upper extremity support for 5 minutes before needing to rest to improve her ability to perform standing functional activities and  ADLs within her home environment.    Baseline  3/18: able to stand 5 mins with no UE support; 03/12/18: Patient reported ability to stand for 10  minutes     Time  3    Period  Weeks    Status  Achieved      PT SHORT TERM GOAL #3   Title  Patient will increase MMT grades by 1/2 grade to show improvement in her strength and stability to perform functional activities.    Baseline  4/26: has improved by at least 1/2 grade throughout since initial evaluation    Time  3    Period  Weeks    Status  Achieved      PT SHORT TERM GOAL #4   Title  Patient will be able to stand on either foot for 10 seconds to exhibit improved balance and decreased risk for falls.    Baseline  03/12/18: Rt. 5.95 seconds; Lt. 12 seconds    Time  3    Period  Weeks    Status  Partially Met        PT Long Term Goals - 03/12/18 1510      PT LONG TERM GOAL #1   Title  Patient will ambulated >500 feet with rolling walker during the 3 MWT to improve her ability to ambulate further  in community to allow to her go enjoy going out for a meal.    Baseline  4/26: 331f, no rest breaks; 03/12/18: 200 feet with seated break after 200 feet    Time  6    Period  Weeks    Status  On-going      PT LONG TERM GOAL #2   Title  Patient with tolerate the standing position with upper extremity support for 10 minutes before needing to rest to improve her ability to stand and have a conversation with a friend or relative.    Baseline  4/10: able to stand statically for 10 mins this date with no UE support; 03/12/18: Patient reported she was able to stand for 10 minutes    Time  6    Period  Weeks    Status  Achieved      PT LONG TERM GOAL #3   Title  Patient will increase MMT grades by 1 grade to show continued improvement in her strength and stability to perform functional activities at home independently.    Baseline  03/12/18: see MMT    Time  6    Period  Weeks    Status  Partially Met      PT LONG TERM GOAL #4   Title  Patient will be able to stand on either foot for 15 seconds to exhibit improved balance and further decreased risk for falls.    Baseline  03/12/18: Rt.  5.95 seconds; Lt. 12 seconds    Time  6    Period  Weeks    Status  On-going      PT LONG TERM GOAL #5   Title  Pt will be able to perform 5xSTS in 12 sec or < with no UE in order to demo improved functional BLE strength and overall balance.    Baseline  4/26: 11.8 sec, no UE; 03/12/18: 12 seconds no upper extremities    Time  6    Period  Weeks    Status  Achieved      PT LONG TERM GOAL #6   Title  Pt will report requiring  assistance for ADLs and IADLs at home 25% of the time or < to demonstrate improved overall functional strength, endurance, and balance in order to promote independence at home.    Baseline  4/10: feels that her nurse aide and dtr provide about 25% assistance for ADLs and IADLs    Time  6    Period  Weeks    Status  Achieved      PT LONG TERM GOAL #7   Title  Pt will be able to perform 13 STS during the 30 sec chair rise test in order to demo improved functional BLE strength and further decrease risk for falls.    Baseline  03/12/18: Patient performed 10 sit to stands in 30 seconds    Time  4    Period  Weeks    Status  On-going      PT LONG TERM GOAL #8   Title  Pt will report being able to stand and ambulate in her kitchen for 20 mins without a rest break to allow her to make a meal with greater ease and maximize function and independence at home.     Baseline  03/12/18: Patient reported she is only able to ambulate for 5-6 minutes at this time    Time  4    Period  Weeks    Status  On-going            Plan - 03/12/18 1847    Clinical Impression Statement  This session performed a re-assessment of patient's progress with goals. Therapist monitored patient's SpO2 and it was found to be 97% throughout. Patient partially achieved one additional short term goal as she demonstrated ability to maintain single limb stance on the left lower extremity for 12 seconds. However, patient's ambulation endurance was decreased since last re-assessment which meant that she did  not achieve one of her goals which she had previously achieved, and patient's strength had not significantly increased and had decreased in some areas. Patient did not demonstrate achievement of any additional long term goals at this session. Patient had recently undergone a hospital stay last week which seems to have greatly affected her endurance and strength. Up to the point of her hospitalization, patient had made good progress with therapy and had achieved many of her goals. As patient had previously demonstrated improvements and progress it is suspected that the limitation in her performance at this re-assessment was due to her recent hospitalization. As therapist has provided patient with many exercises to perform independently at home and as patient is scheduled to have a surgery next week, patient is being discharged from therapy at this time as patient can safely perform exercises on her own and will have a change in medical status following surgery. Therapist discussed with the patient how she should continue her HEP and updated her HEP with additional exercises. Discussed with patient that she should speak with her physician following her surgery and decide if she needs further physical therapy at that time and patient acknowledged this. Patient consented to plan and was told to reach out to clinic with any future questions or concerns.     Rehab Potential  Good    PT Frequency  3x / week    PT Duration  4 weeks    PT Treatment/Interventions  ADLs/Self Care Home Management;Gait training;Stair training;Functional mobility training;Therapeutic activities;Patient/family education;Balance training;Neuromuscular re-education;Therapeutic exercise;Manual techniques;Energy conservation    PT Next Visit Plan  Patient is being discharged at this time  PT Home Exercise Plan  intial - lap walking at home; supine bridges; 3/18: supine clams with RTB; 3/25: SAQ; 4/5: standing abd, marching, LAQ, knee flexion;  4/15: supine figure 4, supine SKTC; 4/17: sidelying hip abd; 5/1: tandem stance and SLS; 03/12/18: Standing hip extension with support 2 x 10 each LE 1x/day, STS no support 2 x 10 1x/day     Consulted and Agree with Plan of Care  Patient       Patient will benefit from skilled therapeutic intervention in order to improve the following deficits and impairments:  Abnormal gait, Pain, Decreased mobility, Decreased coordination, Decreased activity tolerance, Decreased endurance, Decreased strength, Decreased balance, Difficulty walking  Visit Diagnosis: Muscle weakness (generalized)  Pain in left lower leg  Other abnormalities of gait and mobility     Problem List Patient Active Problem List   Diagnosis Date Noted  . History of GI diverticular bleed 03/12/2018  . Lower GI bleed   . Diverticulosis of colon with hemorrhage   . Rectal bleeding 03/04/2018  . Acute GI bleeding 03/04/2018  . Thrombocytopenia ([facility]) 01/03/2018  . Anemia, chronic disease 12/13/2017  . Mild protein-calorie malnutrition ([facility]) 12/13/2017  . Peripheral neuropathy due to chemotherapy ([place]) 12/13/2017  . S/P thoracentesis   . Atrial fibrillation with RVR ([facility]) 11/08/2017  . HCAP (healthcare-associated pneumonia) 11/08/2017  . Pleural effusion 09/20/2017  . Acute on chronic diastolic heart failure ([facility] [place]) 08/19/2017  . Pancytopenia, acquired ([place]) 08/15/2017  . Protein-calorie malnutrition, moderate ([place]) 07/30/2017  . Generalized weakness 07/30/2017  . Antineoplastic chemotherapy induced pancytopenia ([place]) 07/20/2017  . Ovarian CA, right ([place]) 07/18/2017  . PNA (pneumonia) 07/07/2017  . Ascites 06/30/2017  . Ascites, malignant 06/30/2017  . Right ovarian cyst 01/18/2017  . Elevated CA-125 01/18/2017  . Depression 12/23/2015  . Peripheral edema 08/23/2015  . Fracture of hip, left, closed ([place]) 12/08/2013  . Hip fracture ([place]) 12/08/2013  . PAD (peripheral artery disease) ([facility]) 11/25/2013  . Encounter for  therapeutic drug monitoring 11/09/2013  . Cough 03/20/2013  . Total knee replacement status 10/07/2012  . Knee pain 10/07/2012  . Knee stiffness 10/07/2012  . Tachycardia 09/17/2012  . Chest pain 09/17/2012  . Difficulty in walking(719.7) 09/16/2012  . Muscle weakness (generalized) 09/16/2012  . Postop Acute blood loss anemia 08/08/2012  . Instability of prosthetic knee ([place]) 08/06/2012  . Bloating 02/14/2012  . Upper abdominal pain 02/14/2012  . Allergic rhinitis, seasonal 02/15/2011  . COLITIS 02/20/2010  . Diarrhea 01/02/2010  . ABDOMINAL PAIN -GENERALIZED 01/02/2010  . PERSONAL HX COLONIC POLYPS 01/02/2010  . Hypothyroidism 12/28/2009  . COPD (chronic obstructive pulmonary disease) ([facility]) 12/28/2009  . ARTHRITIS 12/28/2009   PHYSICAL THERAPY DISCHARGE SUMMARY  Visits from Start of Care: 26  Current functional level related to goals / functional outcomes: See above   Remaining deficits: See above   Education / Equipment: See above  Plan: Patient agrees to discharge.  Patient goals were partially met. Patient is being discharged due to lack of progress.  ?????         And as patient will be having surgery next week which will be a change in her medical status. Patient was educated on discussing with her physician if she should return for further physical therapy following her surgery if her physician believes she needs it.   Clarene Critchley PT, DPT 6:50 PM, 03/12/18 [facility] [address] [place], [place], [address] Phone: [phone]   Fax:  [phone]  Name: CHANCE KARAM MRN: [id number] Date of Birth: 1932-10-15

## 2018-03-13 ENCOUNTER — Inpatient Hospital Stay (HOSPITAL_BASED_OUTPATIENT_CLINIC_OR_DEPARTMENT_OTHER): Payer: Medicare Other | Admitting: Hematology and Oncology

## 2018-03-13 ENCOUNTER — Inpatient Hospital Stay: Payer: Medicare Other

## 2018-03-13 ENCOUNTER — Encounter: Payer: Self-pay | Admitting: Hematology and Oncology

## 2018-03-13 ENCOUNTER — Telehealth: Payer: Self-pay | Admitting: Oncology

## 2018-03-13 VITALS — BP 106/59 | HR 96 | Temp 98.3°F | Resp 17

## 2018-03-13 DIAGNOSIS — Z95828 Presence of other vascular implants and grafts: Secondary | ICD-10-CM

## 2018-03-13 DIAGNOSIS — K922 Gastrointestinal hemorrhage, unspecified: Secondary | ICD-10-CM

## 2018-03-13 DIAGNOSIS — D638 Anemia in other chronic diseases classified elsewhere: Secondary | ICD-10-CM

## 2018-03-13 DIAGNOSIS — J90 Pleural effusion, not elsewhere classified: Secondary | ICD-10-CM | POA: Diagnosis not present

## 2018-03-13 DIAGNOSIS — I700 Atherosclerosis of aorta: Secondary | ICD-10-CM | POA: Diagnosis not present

## 2018-03-13 DIAGNOSIS — C561 Malignant neoplasm of right ovary: Secondary | ICD-10-CM

## 2018-03-13 DIAGNOSIS — Z79899 Other long term (current) drug therapy: Secondary | ICD-10-CM

## 2018-03-13 DIAGNOSIS — Z9221 Personal history of antineoplastic chemotherapy: Secondary | ICD-10-CM | POA: Diagnosis not present

## 2018-03-13 DIAGNOSIS — C562 Malignant neoplasm of left ovary: Secondary | ICD-10-CM | POA: Diagnosis not present

## 2018-03-13 LAB — CMP (CANCER CENTER ONLY)
ALBUMIN: 3.5 g/dL (ref 3.5–5.0)
ALK PHOS: 54 U/L (ref 40–150)
ALT: 7 U/L (ref 0–55)
AST: 14 U/L (ref 5–34)
Anion gap: 8 (ref 3–11)
BUN: 15 mg/dL (ref 7–26)
CALCIUM: 9.2 mg/dL (ref 8.4–10.4)
CO2: 26 mmol/L (ref 22–29)
CREATININE: 1 mg/dL (ref 0.60–1.10)
Chloride: 104 mmol/L (ref 98–109)
GFR, EST AFRICAN AMERICAN: 59 mL/min — AB (ref >60)
GFR, Estimated: 51 mL/min — ABNORMAL LOW (ref >60)
GLUCOSE: 96 mg/dL (ref 70–140)
Potassium: 4.3 mmol/L (ref 3.5–5.1)
SODIUM: 138 mmol/L (ref 136–145)
Total Bilirubin: 0.3 mg/dL (ref 0.2–1.2)
Total Protein: 6.9 g/dL (ref 6.4–8.3)

## 2018-03-13 LAB — SAMPLE TO BLOOD BANK

## 2018-03-13 LAB — CBC WITH DIFFERENTIAL (CANCER CENTER ONLY)
BASOS PCT: 0 %
Basophils Absolute: 0 10*3/uL (ref 0.0–0.1)
EOS ABS: 0.2 10*3/uL (ref 0.0–0.5)
Eosinophils Relative: 3 %
HCT: 31.2 % — ABNORMAL LOW (ref 34.8–46.6)
Hemoglobin: 10.1 g/dL — ABNORMAL LOW (ref 11.6–15.9)
Lymphocytes Relative: 27 %
Lymphs Abs: 1.7 10*3/uL (ref 0.9–3.3)
MCH: 32.3 pg (ref 25.1–34.0)
MCHC: 32.4 g/dL (ref 31.5–36.0)
MCV: 99.7 fL (ref 79.5–101.0)
MONO ABS: 0.7 10*3/uL (ref 0.1–0.9)
MONOS PCT: 10 %
Neutro Abs: 3.8 10*3/uL (ref 1.5–6.5)
Neutrophils Relative %: 60 %
PLATELETS: 163 10*3/uL (ref 145–400)
RBC: 3.13 MIL/uL — ABNORMAL LOW (ref 3.70–5.45)
RDW: 14.4 % (ref 11.2–14.5)
WBC Count: 6.3 10*3/uL (ref 3.9–10.3)

## 2018-03-13 LAB — IRON AND TIBC
IRON: 41 ug/dL (ref 41–142)
SATURATION RATIOS: 14 % — AB (ref 21–57)
TIBC: 301 ug/dL (ref 236–444)
UIBC: 260 ug/dL

## 2018-03-13 LAB — FERRITIN: FERRITIN: 41 ng/mL (ref 9–269)

## 2018-03-13 MED ORDER — SODIUM CHLORIDE 0.9% FLUSH
10.0000 mL | Freq: Once | INTRAVENOUS | Status: AC
  Administered 2018-03-13: 10 mL
  Filled 2018-03-13: qty 10

## 2018-03-13 MED ORDER — FERUMOXYTOL INJECTION 510 MG/17 ML
510.0000 mg | Freq: Once | INTRAVENOUS | Status: AC
  Administered 2018-03-13: 510 mg via INTRAVENOUS
  Filled 2018-03-13: qty 17

## 2018-03-13 MED ORDER — SODIUM CHLORIDE 0.9 % IV SOLN
Freq: Once | INTRAVENOUS | Status: AC
  Administered 2018-03-13: 13:00:00 via INTRAVENOUS

## 2018-03-13 MED ORDER — HEPARIN SOD (PORK) LOCK FLUSH 100 UNIT/ML IV SOLN
500.0000 [IU] | Freq: Once | INTRAVENOUS | Status: AC
  Administered 2018-03-13: 500 [IU]
  Filled 2018-03-13: qty 5

## 2018-03-13 NOTE — Progress Notes (Signed)
Excellent Assessment and plan reviewed

## 2018-03-13 NOTE — Assessment & Plan Note (Signed)
Recent PET CT scan showed excellent response to treatment The patient will undergo surgery next week I plan to see her back within the month after surgery to review surgical pathology results and to discuss whether she would need further treatment or not She agree with the plan of care

## 2018-03-13 NOTE — Assessment & Plan Note (Signed)
She had recurrent GI bleed recently requiring hospitalization and multiple units of blood transfusion Since discontinuation of Xarelto, she denies further bleeding Her hemoglobin has improved but the patient remained iron deficient Due to upcoming surgery, I recommend 1 dose of intravenous iron infusion so that we can have faster blood count recovery in anticipation for surgery The patient have mild shortness of breath which could be contributed by her persistent anemia We discussed the risk, benefits, side effects of iron infusion and she agreed to proceed

## 2018-03-13 NOTE — Progress Notes (Signed)
[name] OFF[facility] PROGRESS NOTE  Patient Care Team: Asencion Noble, MD as PCP - General (Internal Medicine) Satira Sark, MD as PCP - Cardiology (Cardiology) Ahmed Prima, Fransisco Hertz, PA-C as Physician Assistant (Physician Assistant) Heath Lark, MD as Consulting Physician (Hematology and Oncology) Collene Gobble, MD as Consulting Physician (Pulmonary Disease)  ASSESSMENT & PLAN:  Ovarian CA, right [facility]) Recent PET CT scan showed excellent response to treatment The patient will undergo surgery next week I plan to see her back within the month after surgery to review surgical pathology results and to discuss whether she would need further treatment or not She agree with the plan of care  Lower GI bleed She had recurrent GI bleed recently requiring hospitalization and multiple units of blood transfusion Since discontinuation of Xarelto, she denies further bleeding Her hemoglobin has improved but the patient remained iron deficient Due to upcoming surgery, I recommend 1 dose of intravenous iron infusion so that we can have faster blood count recovery in anticipation for surgery The patient have mild shortness of breath which could be contributed by her persistent anemia We discussed the risk, benefits, side effects of iron infusion and she agreed to proceed  Pleural effusion She has recurrent pleural effusion that is contributing to shortness of breath She is scheduled for thoracentesis next week   No orders of the defined types were placed in this encounter.   INTERVAL HISTORY: Please see below for problem oriented charting. She returns for further follow-up with her daughter She feels well since discontinuation of chemotherapy She is getting stronger since recent discharge from the hospital for GI bleed The patient denies any recent signs or symptoms of bleeding such as spontaneous epistaxis, hematuria or hematochezia. She continues to have mild shortness of breath  on exertion.  Denies cough.  Her appetite is good She denies recent nausea or changes in bowel habits.  No abnormal vaginal discharge   SUMMARY OF ONCOLOGIC HISTORY:   Ovarian CA, right ([place])   02/18/2016 Tumor Marker    Patient's tumor was tested for the following markers: CA125 Results of the tumor marker test revealed 45      05/22/2016 Tumor Marker    Patient's tumor was tested for the following markers: CA125 Results of the tumor marker test revealed 53      05/22/2016 Imaging    Outside pelvic US showed 4.1 cm adnexa mass      06/24/2017 Imaging    Ct abdomen and pelvis:  1. Interim finding of moderate ascites within the abdomen and pelvis with additional finding of diffuse nodular infiltration of the omentum and anterior mesenteric fat, the appearance would be consistent with peritoneal carcinomatosis/metastatic disease. Increasing retroperitoneal and upper abdominal adenopathy. 2. Re- demonstrated 3.8 cm cyst in the right adnexa. Enlarging soft tissue density in the left adnexa now with possible cystic component posteriorly. In light of the above findings, concern is for ovarian neoplasm. Correlation with pelvic ultrasound recommended. 3. Small right-sided pleural effusion, new since prior study 4. Stable hypodense splenic lesions since 2017.       06/25/2017 Imaging    US pelvis: 2.9 cm simple appearing cyst in the right ovary. Left ovary grossly unremarkable. Large volume ascites in the pelvis      06/30/2017 - 07/01/2017 Hospital Admission    She was admitted for evaluation of abdominal pain and ascites      07/01/2017 Pathology Results    PERITONEAL/ASCITIC FLUID(SPECIMEN 1 OF 1 COLLECTED 07/01/17): - POORLY DIFFERENTIATED  CARCINOMA; SEE COMMENT Source Peritoneal/Ascitic Fluid, (specimen 1 of 1 collected 07/01/17) Gross Specimen: Received is/are 1000 cc's of brownish fluid. (BS:bs) Prepared: # Smears: 0 # Concentration Technique Slides (i.e. ThinPrep): 1 # Cell  Block: 1 Additional Studies: Also received Hematology slide - [id number]. Comment The tumor cells are positive for cytokeratin 7 and Pax-8 but negative for cytokeratin 20, CDX-2, GATA-3, Napsin-A and TTF-1. Based on the immunoprofile a gynecology primary is favored      07/01/2017 Procedure    Successful ultrasound-guided diagnostic and therapeutic paracentesis yielding 2.5 liters of peritoneal fluid      07/07/2017 - 07/09/2017 Hospital Admission    She was admitted for management of malignant ascites      07/08/2017 Procedure    Successful ultrasound-guided therapeutic paracentesis yielding 2.7 liters liters of peritoneal fluid      07/12/2017 Procedure    Successful ultrasound-guided paracentesis yielding 1450 mL of peritoneal fluid      07/18/2017 - 07/24/2017 Hospital Admission    She was admitted for expedited treatment      07/18/2017 Tumor Marker    Patient's tumor was tested for the following markers: CA125 Results of the tumor marker test revealed 1941      07/19/2017 - 02/04/2018 Chemotherapy    The patient had 6 cycles of carboplatin & Taxol for chemotherapy treatment, followed by 3 more cycles of carboplatin only       08/06/2017 Procedure    Successful ultrasound-guided therapeutic paracentesis yielding 2.6 liters of peritoneal fluid.      08/09/2017 Tumor Marker    Patient's tumor was tested for the following markers: CA125 Results of the tumor marker test revealed 1665      08/15/2017 Tumor Marker    Patient's tumor was tested for the following markers: CA125 Results of the tumor marker test revealed 937.9      08/20/2017 Imaging    ECHO: Normal LV size with EF 60-65%. Normal RV size and systolic function. No significant valvular abnormalities.      09/18/2017 Imaging    Chest Impression:  1. No evidence thoracic metastasis. 2. Interval increase and RIGHT pleural effusion.  Abdomen / Pelvis Impression:  1. Interval decrease in intraperitoneal free  fluid. 2. Interval decrease in omental nodularity in the LEFT ventral peritoneal space. 3. Interval decrease in nodularity associated with the LEFT ovary. 4. Cystic portion of the RIGHT ovary is increased mildly in size.      09/20/2017 Tumor Marker    Patient's tumor was tested for the following markers: CA125 Results of the tumor marker test revealed 347      10/14/2017 Tumor Marker    Patient's tumor was tested for the following markers: CA125 Results of the tumor marker test revealed 307.4      11/04/2017 Tumor Marker    Patient's tumor was tested for the following markers: CA125 Results of the tumor marker test revealed 262.5      11/28/2017 Imaging    1. Interval decrease in right pleural effusion with resolution of right atelectasis seen previously. 2. New small left pleural effusion, symmetric to the right. 3. No intraperitoneal free fluid on the current study. 4. Continued further decrease in left omental disease, appearing less confluent today than on the prior study. 5. Left ovary remains normal in appearance today and the right adnexal cystic lesion is decreased in size compared to prior study. 6. 14 mm pancreatic cyst is unchanged. Continued attention on follow-up imaging recommended. 7. Aortic Atherosclerois (ICD10-170.0)  12/13/2017 Tumor Marker    Patient's tumor was tested for the following markers: CA125 Results of the tumor marker test revealed 197.7      01/03/2018 Tumor Marker    Patient's tumor was tested for the following markers: CA125 Results of the tumor marker test revealed 183.1      01/14/2018 Tumor Marker    Patient's tumor was tested for the following markers: CA125 Results of the tumor marker test revealed 177.4      02/04/2018 Tumor Marker    Patient's tumor was tested for the following markers: CA125 Results of the tumor marker test revealed 168.5      02/25/2018 Imaging    1. Omental carcinomatosis appears qualitatively stable to  slightly decreased. Stable mild peritoneal thickening in the paracolic gutters. Stable right adnexal cyst. No ascites. No new or progressive metastatic disease in the abdomen or pelvis. 2. Small dependent right pleural effusion is increased. 3. Cystic pancreatic body lesion is decreased and now subcentimeter in size, suggesting a benign lesion. 4. Aortic Atherosclerosis (ICD10-I70.0).      03/03/2018 - 03/07/2018 Hospital Admission    She was hospitalized for GI bleed requiring blood transfusions. Xarelto was placed on hold      03/07/2018 PET scan    1. Persistent hazy omental interstitial nodularity but no hypermetabolism or discrete measurable nodules. No abdominal ascites. 2. No findings for metastatic disease involving the chest. 3. Moderate-sized right pleural effusion and small left pleural effusion.        REVIEW OF SYSTEMS:   Constitutional: Denies fevers, chills or abnormal weight loss Eyes: Denies blurriness of vision Ears, nose, mouth, throat, and face: Denies mucositis or sore throat Cardiovascular: Denies palpitation, chest discomfort or lower extremity swelling Gastrointestinal:  Denies nausea, heartburn or change in bowel habits Skin: Denies abnormal skin rashes Lymphatics: Denies new lymphadenopathy or easy bruising Neurological:Denies numbness, tingling or new weaknesses Behavioral/Psych: Mood is stable, no new changes  All other systems were reviewed with the patient and are negative.  I have reviewed the past medical history, past surgical history, social history and family history with the patient and they are unchanged from previous note.  ALLERGIES:  is allergic to codeine.  MEDICATIONS:  Current Outpatient Medications  Medication Sig Dispense Refill  . albuterol (PROAIR HFA) 108 (90 Base) MCG/ACT inhaler Inhale 2 puffs into the lungs every 6 (six) hours as needed for wheezing or shortness of breath. 1 Inhaler 3  . ALPRAZolam (XANAX) 0.25 MG tablet Take 1  tablet (0.25 mg total) by mouth at bedtime as needed for anxiety. 5 tablet 0  . azelastine (ASTELIN) 0.1 % nasal spray Place 2 sprays into both nostrils 2 (two) times daily as needed for allergies.     . Budesonide ER (UCERIS) 9 MG TB24 Take 1 tablet by mouth daily. 30 tablet 3  . Cholecalciferol (VITAMIN D) 2000 units CAPS Take 2,000 Units by mouth daily.    Marland Kitchen dicyclomine (BENTYL) 10 MG capsule Take 1 tab by mouth every morning. May take twice daily as needed. 60 capsule 6  . diltiazem (CARDIZEM CD) 120 MG 24 hr capsule Take 1 capsule (120 mg total) by mouth every evening. 90 capsule 3  . diltiazem (CARDIZEM CD) 180 MG 24 hr capsule Take 1 capsule (180 mg total) by mouth every morning. 90 capsule 3  . diphenhydrAMINE (BENADRYL) 25 mg capsule Take 25 mg by mouth every 6 (six) hours as needed for itching.    . diphenoxylate-atropine (LOMOTIL) 2.5-0.025 MG  tablet Take 1 tablet by mouth 4 (four) times daily. (Patient taking differently: Take 1 tablet by mouth 4 (four) times daily as needed for diarrhea or loose stools. ) 360 tablet 1  . DULoxetine (CYMBALTA) 30 MG capsule TAKE 1 CAPSULE(30 MG) BY MOUTH DAILY (Patient taking differently: TAKE 1 CAPSULE(30 MG) BY MOUTH DAILY AT NIGHT.) 30 capsule 0  . fluticasone (FLONASE) 50 MCG/ACT nasal spray Place 2 sprays into both nostrils 2 (two) times daily as needed (FOR NASAL CONGESTION.).     Marland Kitchen furosemide (LASIX) 20 MG tablet Take 1 tablet (20 mg total) by mouth daily. 90 tablet 9  . HYDROcodone-acetaminophen (NORCO) 10-325 MG tablet Take 1 tablet by mouth every 4 (four) hours as needed. (Patient taking differently: Take 1 tablet by mouth every 4 (four) hours as needed for moderate pain. ) 90 tablet 0  . ipratropium-albuterol (DUONEB) 0.5-2.5 (3) MG/3ML SOLN Take 3 mLs by nebulization 3 (three) times daily. (Patient taking differently: Take 3 mLs by nebulization every 6 (six) hours as needed (cough or SOB). As needed) 360 mL   . levothyroxine (SYNTHROID,  LEVOTHROID) 137 MCG tablet Take 137 mcg by mouth daily before breakfast. For thyroid therapy    . lidocaine-prilocaine (EMLA) cream Apply 1 application topically as needed. (Patient taking differently: Apply 1 application topically as needed (port). ) 30 g 6  . mirtazapine (REMERON) 15 MG tablet TAKE 1 TABLET(15 MG) BY MOUTH AT BEDTIME 30 tablet 0  . morphine (MS CONTIN) 15 MG 12 hr tablet Take 1 tablet (15 mg total) by mouth every 12 (twelve) hours. (Patient taking differently: Take 15 mg by mouth every 12 (twelve) hours as needed (FOR PAIN.). ) 60 tablet 0  . ondansetron (ZOFRAN) 8 MG tablet Take 8 mg by mouth every 8 (eight) hours as needed for nausea or vomiting.    . polyvinyl alcohol (LIQUIFILM TEARS) 1.4 % ophthalmic solution 1 drop 2 (two) times daily as needed for dry eyes.    . potassium chloride (K-DUR) 10 MEQ tablet Take 10 mEq by mouth daily.    Derrill Memo ON 03/21/2018] rivaroxaban (XARELTO) 20 MG TABS tablet TAKE 1 TABLET BY MOUTH EVERY DAY WITH SUPPER (Patient not taking: Reported on 03/10/2018) 90 tablet 1  . Tiotropium Bromide-Olodaterol (STIOLTO RESPIMAT) 2.5-2.5 MCG/ACT AERS Inhale 2 puffs into the lungs daily. 1 Inhaler 3   No current facility-administered medications for this visit.     PHYSICAL EXAMINATION: ECOG PERFORMANCE STATUS: 1 - Symptomatic but completely ambulatory  Vitals:   03/13/18 1010  BP: (!) 123/58  Pulse: 95  Resp: 18  Temp: 98.1 F (36.7 C)  SpO2: 94%   Filed Weights   03/13/18 1010  Weight: 166 lb 4.8 oz (75.4 kg)    GENERAL:alert, no distress and comfortable SKIN: skin color, texture, turgor are normal, no rashes or significant lesions EYES: normal, Conjunctiva are pink and non-injected, sclera clear OROPHARYNX:no exudate, no erythema and lips, buccal mucosa, and tongue normal  NECK: supple, thyroid normal size, non-tender, without nodularity LYMPH:  no palpable lymphadenopathy in the cervical, axillary or inguinal LUNGS: clear to auscultation  and percussion with normal breathing effort HEART: regular rate & rhythm and no murmurs and no lower extremity edema ABDOMEN:abdomen soft, non-tender and normal bowel sounds Musculoskeletal:no cyanosis of digits and no clubbing  NEURO: alert & oriented x 3 with fluent speech, no focal motor/sensory deficits  LABORATORY DATA:  I have reviewed the data as listed    Component Value Date/Time   NA  138 03/13/2018 0940   NA 135 (L) 09/24/2017 0945   K 4.3 03/13/2018 0940   K 3.7 09/24/2017 0945   CL 104 03/13/2018 0940   CO2 26 03/13/2018 0940   CO2 30 (H) 09/24/2017 0945   GLUCOSE 96 03/13/2018 0940   GLUCOSE 101 09/24/2017 0945   BUN 15 03/13/2018 0940   BUN 15.3 09/24/2017 0945   CREATININE 1.00 03/13/2018 0940   CREATININE 0.8 09/24/2017 0945   CALCIUM 9.2 03/13/2018 0940   CALCIUM 9.1 09/24/2017 0945   PROT 6.9 03/13/2018 0940   PROT 6.6 09/24/2017 0945   ALBUMIN 3.5 03/13/2018 0940   ALBUMIN 3.3 (L) 09/24/2017 0945   AST 14 03/13/2018 0940   AST 16 09/24/2017 0945   ALT 7 03/13/2018 0940   ALT 12 09/24/2017 0945   ALKPHOS 54 03/13/2018 0940   ALKPHOS 52 09/24/2017 0945   BILITOT 0.3 03/13/2018 0940   BILITOT 1.13 09/24/2017 0945   GFRNONAA 51 (L) 03/13/2018 0940   GFRAA 59 (L) 03/13/2018 0940    No results found for: SPEP, UPEP  Lab Results  Component Value Date   WBC 6.3 03/13/2018   NEUTROABS 3.8 03/13/2018   HGB 10.1 (L) 03/13/2018   HCT 31.2 (L) 03/13/2018   MCV 99.7 03/13/2018   PLT 163 03/13/2018      Chemistry      Component Value Date/Time   NA 138 03/13/2018 0940   NA 135 (L) 09/24/2017 0945   K 4.3 03/13/2018 0940   K 3.7 09/24/2017 0945   CL 104 03/13/2018 0940   CO2 26 03/13/2018 0940   CO2 30 (H) 09/24/2017 0945   BUN 15 03/13/2018 0940   BUN 15.3 09/24/2017 0945   CREATININE 1.00 03/13/2018 0940   CREATININE 0.8 09/24/2017 0945      Component Value Date/Time   CALCIUM 9.2 03/13/2018 0940   CALCIUM 9.1 09/24/2017 0945   ALKPHOS 54  03/13/2018 0940   ALKPHOS 52 09/24/2017 0945   AST 14 03/13/2018 0940   AST 16 09/24/2017 0945   ALT 7 03/13/2018 0940   ALT 12 09/24/2017 0945   BILITOT 0.3 03/13/2018 0940   BILITOT 1.13 09/24/2017 0945       RADIOGRAPHIC STUDIES: I have personally reviewed the radiological images as listed and agreed with the findings in the report. Dg Chest 2 View  Result Date: 03/06/2018 CLINICAL DATA:  Hypoxia EXAM: CHEST - 2 VIEW COMPARISON:  09/24/2017 FINDINGS: Porta catheter with tip at the SVC. Normal heart size. Stable mediastinal contours accounting for leftward rotation. Large lung volumes with trace bilateral pleural fluid. No consolidation or interstitial edema. No acute osseous finding. IMPRESSION: 1. No acute finding when compared to prior. 2. Chronic large lung volumes and trace effusions. Electronically Signed   By: Monte Fantasia M.D.   On: 03/06/2018 10:07   Ct Abdomen Pelvis W Contrast  Result Date: 02/25/2018 CLINICAL DATA:  Right ovarian cancer diagnosed 07/01/2017. Patient presents for restaging after interval chemotherapy. EXAM: CT ABDOMEN AND PELVIS WITH CONTRAST TECHNIQUE: Multidetector CT imaging of the abdomen and pelvis was performed using the standard protocol following bolus administration of intravenous contrast. CONTRAST:  122m ISOVUE-300 IOPAMIDOL (ISOVUE-300) INJECTION 61% COMPARISON:  11/28/2017 CT abdomen/pelvis. FINDINGS: Lower chest: Small dependent right pleural effusion, increased with increased mild dependent right lung base atelectasis. Right coronary atherosclerosis. Hepatobiliary: Normal liver size. No liver mass. Cholecystectomy. Bile ducts are stable and within normal post cholecystectomy limits with CBD diameter 8 mm. Pancreas: Cystic 0.9 cm  pancreatic body lesion (series 2/image 17), decreased from 1.4 cm. No new pancreatic lesions. No pancreatic duct dilation. Spleen: Normal size. No mass. Adrenals/Urinary Tract: Normal adrenals. No hydronephrosis. Stable  subcentimeter hypodense renal cortical lesions in both kidneys, too small to characterize probably benign. No new renal lesions. Normal bladder. Stomach/Bowel: Normal non-distended stomach. Normal caliber small bowel with no small bowel wall thickening. Appendix not discretely visualized. No pericecal inflammatory changes. Mild sigmoid diverticulosis. No definite large bowel wall thickening or acute pericolonic fat stranding. Apparent wall thickening in the sigmoid colon and rectum is favored to be due to under distention. Vascular/Lymphatic: Atherosclerotic nonaneurysmal abdominal aorta. Patent portal, splenic, hepatic and renal veins. Retroaortic left renal vein. No pathologically enlarged lymph nodes in the abdomen or pelvis. Reproductive: Right adnexal 4.0 cm cyst (series 2/image 60), previously 3.9 cm, not appreciably changed. No left adnexal mass. Grossly normal atrophic uterus. Other: No pneumoperitoneum. No focal fluid collection. No ascites. Soft tissue caking in the omentum appears qualitatively stable to slightly decreased (series 2/image 54). Stable mild peritoneal thickening in the paracolic gutters. No new focal peritoneal implants. Musculoskeletal: No aggressive appearing focal osseous lesions. Partially visualized fixation hardware in the proximal left femur. Marked lumbar spondylosis. Stable mild to moderate compression deformity of the superior L5 vertebral body. IMPRESSION: 1. Omental carcinomatosis appears qualitatively stable to slightly decreased. Stable mild peritoneal thickening in the paracolic gutters. Stable right adnexal cyst. No ascites. No new or progressive metastatic disease in the abdomen or pelvis. 2. Small dependent right pleural effusion is increased. 3. Cystic pancreatic body lesion is decreased and now subcentimeter in size, suggesting a benign lesion. 4.  Aortic Atherosclerosis (ICD10-I70.0). Electronically Signed   By: Ilona Sorrel M.D.   On: 02/25/2018 14:16   Nm Pet Image  Initial (pi) Skull Base To Thigh  Result Date: 03/07/2018 CLINICAL DATA:  Subsequent treatment strategy for metastatic ovarian cancer. EXAM: NUCLEAR MEDICINE PET SKULL BASE TO THIGH TECHNIQUE: 7.8 mCi F-18 FDG was injected intravenously. Full-ring PET imaging was performed from the skull base to thigh after the radiotracer. CT data was obtained and used for attenuation correction and anatomic localization. Fasting blood glucose: 102 mg/dl COMPARISON:  CT scan 11/28/2017 and 02/25/2018 FINDINGS: Mediastinal blood pool activity: SUV max 2.50 NECK: No hypermetabolic lymph nodes in the neck. Diffuse uptake in the thyroid gland likely due to thyroiditis. Incidental CT findings: none CHEST: Moderate-sized right pleural effusion and small left pleural effusion. No worrisome pulmonary lesions and no enlarged or hypermetabolic mediastinal or hilar lymph nodes. Incidental CT findings: none ABDOMEN/PELVIS: Stable hazy interstitial nodularity in the omentum without discrete measurable nodules or hypermetabolism. No evidence of peritoneal surface disease involving the liver. No hepatic metastatic disease. No enlarged or hypermetabolic abdominal/pelvic lymph nodes. Incidental CT findings: Advanced atherosclerotic calcifications involving the abdominal aorta. Status post cholecystectomy.  No biliary dilatation. Upper pole left renal calculus. Stable 4 cm simple appearing right adnexal cyst. No hypermetabolism in or around the lesion. SKELETON: No focal hypermetabolic activity to suggest skeletal metastasis. Incidental CT findings: Left hip hardware with moderate artifact. Diffuse osteoporosis. IMPRESSION: 1. Persistent hazy omental interstitial nodularity but no hypermetabolism or discrete measurable nodules. No abdominal ascites. 2. No findings for metastatic disease involving the chest. 3. Moderate-sized right pleural effusion and small left pleural effusion. Electronically Signed   By: Marijo Sanes M.D.   On: 03/07/2018  15:58    All questions were answered. The patient knows to call the clinic with any problems, questions or concerns. No  barriers to learning was detected.  I spent 15 minutes counseling the patient face to face. The total time spent in the appointment was 20 minutes and more than 50% was on counseling and review of test results  Heath Lark, MD 03/13/2018 12:45 PM

## 2018-03-13 NOTE — Patient Instructions (Signed)

## 2018-03-13 NOTE — Assessment & Plan Note (Signed)
She has recurrent pleural effusion that is contributing to shortness of breath She is scheduled for thoracentesis next week

## 2018-03-13 NOTE — Telephone Encounter (Signed)
Called patient's daughter, [name] and advised her that Alliya's iron came back low.  Asked if she would like an iron infusion to help bring her level up before surgery.  Advised she can have the infusion at 1:30.  [name] said they went back to [place] for lunch but would like the infusion.  They will try to be back by 1:30.

## 2018-03-14 LAB — CA 125: CANCER ANTIGEN (CA) 125: 178.7 U/mL — AB (ref 0.0–38.1)

## 2018-03-17 ENCOUNTER — Ambulatory Visit (HOSPITAL_COMMUNITY)
Admission: RE | Admit: 2018-03-17 | Discharge: 2018-03-17 | Disposition: A | Discharge status: Home / Self Care | Payer: Medicare Other | Source: Ambulatory Visit | Attending: Adult Health | Admitting: Adult Health

## 2018-03-17 ENCOUNTER — Other Ambulatory Visit: Payer: Self-pay

## 2018-03-17 ENCOUNTER — Encounter (HOSPITAL_COMMUNITY)
Admission: RE | Admit: 2018-03-17 | Discharge: 2018-03-17 | Disposition: A | Discharge status: Home / Self Care | Payer: Medicare Other | Source: Ambulatory Visit | Attending: Obstetrics | Admitting: Obstetrics

## 2018-03-17 ENCOUNTER — Encounter (HOSPITAL_COMMUNITY): Payer: Self-pay | Admitting: Physician Assistant

## 2018-03-17 ENCOUNTER — Other Ambulatory Visit: Payer: Self-pay | Admitting: Adult Health

## 2018-03-17 DIAGNOSIS — J42 Unspecified chronic bronchitis: Secondary | ICD-10-CM

## 2018-03-17 DIAGNOSIS — Z9889 Other specified postprocedural states: Secondary | ICD-10-CM | POA: Insufficient documentation

## 2018-03-17 HISTORY — DX: Other seasonal allergic rhinitis: J30.2

## 2018-03-17 HISTORY — DX: Chronic diastolic (congestive) heart failure: I50.32

## 2018-03-17 HISTORY — DX: Drug-induced polyneuropathy: T45.1X5A

## 2018-03-17 HISTORY — DX: Pleural effusion, not elsewhere classified: J90

## 2018-03-17 HISTORY — PX: IR THORACENTESIS ASP PLEURAL SPACE W/IMG GUIDE: IMG5380

## 2018-03-17 HISTORY — DX: Malignant ascites: R18.0

## 2018-03-17 HISTORY — DX: Iron deficiency anemia secondary to blood loss (chronic): D50.0

## 2018-03-17 HISTORY — DX: Drug-induced polyneuropathy: G62.0

## 2018-03-17 HISTORY — DX: Anxiety disorder, unspecified: F41.9

## 2018-03-17 LAB — CBC
HCT: 32.8 % — ABNORMAL LOW (ref 36.0–46.0)
Hemoglobin: 10.8 g/dL — ABNORMAL LOW (ref 12.0–15.0)
MCH: 32.6 pg (ref 26.0–34.0)
MCHC: 32.9 g/dL (ref 30.0–36.0)
MCV: 99.1 fL (ref 78.0–100.0)
PLATELETS: 203 10*3/uL (ref 150–400)
RBC: 3.31 MIL/uL — AB (ref 3.87–5.11)
RDW: 14.1 % (ref 11.5–15.5)
WBC: 7.2 10*3/uL (ref 4.0–10.5)

## 2018-03-17 LAB — URINALYSIS, ROUTINE W REFLEX MICROSCOPIC
Bilirubin Urine: NEGATIVE
Glucose, UA: NEGATIVE mg/dL
Ketones, ur: 5 mg/dL — AB
Nitrite: NEGATIVE
Protein, ur: 30 mg/dL — AB
Specific Gravity, Urine: 1.027 (ref 1.005–1.030)
pH: 5 (ref 5.0–8.0)

## 2018-03-17 LAB — COMPREHENSIVE METABOLIC PANEL
ALT: 14 U/L (ref 14–54)
AST: 22 U/L (ref 15–41)
Albumin: 3.7 g/dL (ref 3.5–5.0)
Alkaline Phosphatase: 47 U/L (ref 38–126)
Anion gap: 11 (ref 5–15)
BUN: 14 mg/dL (ref 6–20)
CHLORIDE: 98 mmol/L — AB (ref 101–111)
CO2: 26 mmol/L (ref 22–32)
CREATININE: 0.93 mg/dL (ref 0.44–1.00)
Calcium: 8.6 mg/dL — ABNORMAL LOW (ref 8.9–10.3)
GFR calc non Af Amer: 56 mL/min — ABNORMAL LOW (ref >60)
Glucose, Bld: 104 mg/dL — ABNORMAL HIGH (ref 65–99)
Potassium: 4.1 mmol/L (ref 3.5–5.1)
SODIUM: 135 mmol/L (ref 135–145)
Total Bilirubin: 0.6 mg/dL (ref 0.3–1.2)
Total Protein: 7.1 g/dL (ref 6.5–8.1)

## 2018-03-17 MED ORDER — LIDOCAINE HCL (PF) 2 % IJ SOLN
INTRAMUSCULAR | Status: AC | PRN
  Administered 2018-03-17: 10 mL

## 2018-03-17 MED ORDER — LIDOCAINE HCL (PF) 2 % IJ SOLN
INTRAMUSCULAR | Status: AC
  Filled 2018-03-17: qty 10

## 2018-03-17 NOTE — Procedures (Signed)
PROCEDURE SUMMARY:  Successful US guided right thoracentesis. Yielded 640 mL of hazy gold fluid. Patient tolerated procedure well. No immediate complications.  Specimen was sent for labs.  Post procedure chest X-ray reveals no pneumothorax  WENDY S BLAIR PA-C 03/17/2018 11:10 AM

## 2018-03-17 NOTE — Patient Instructions (Addendum)
Hazelene Doten Fecteau  03/17/2018   Your procedure is scheduled on:   03-20-18  Report to [facility] Main  Entrance  Report to admitting at     10:00 AM    Call this number if you have problems the morning of surgery [phone]               Eat a light diet the day before surgery.  Examples including soups, broths, toast, yogurt, mashed potatoes.  Things to avoid include carbonated beverages (fizzy beverages), raw fruits and raw vegetables, or beans.   If your bowels are filled with gas, your surgeon will have difficulty visualizing your pelvic organs which increases your surgical risks.Follow a light diet the day before surgery toast soups yogurt and broth. Avoid  raw fruits and  Veggies and carbonated beverages   Remember: Do not eat food :After Midnight.   CLEAR LIQUID DIET   Foods Allowed                                                                     Foods Excluded  Coffee and tea, regular and decaf                             liquids that you cannot  Plain Jell-O in any flavor                                             see through such as: Fruit ices (not with fruit pulp)                                     milk, soups, orange juice  Iced Popsicles                                    All solid food Carbonated beverages, regular and diet                                    Cranberry, grape and apple juices Sports drinks like Gatorade Lightly seasoned clear broth or consume(fat free) Sugar, honey syrup  Sample Menu Breakfast                                Lunch                                     Supper Cranberry juice                    Beef broth  Chicken broth Jell-O                                     Grape juice                           Apple juice Coffee or tea                        Jell-O                                      Popsicle                                                Coffee or tea                         Coffee or tea  _____________________________________________________________________     NO SOLID FOOD AFTER MIDNIGHT THE NIGHT PRIOR TO SURGERY. NOTHING BY MOUTH EXCEPT CLEAR LIQUIDS UNTIL 3 HOURS PRIOR TO [place] SURGERY. PLEASE FINISH ENSURE DRINK PER SURGEON ORDER 3 HOURS PRIOR TO SCHEDULED SURGERY TIME WHICH NEEDS TO BE COMPLETED AT __0900 am______.                Take these medicines the morning of surgery with A SIP OF WATER: inhalers and bring, ms contin, levothyroxine, diltiazem,dicyclomine,symbicort inhaler                                You may not have any metal on your body including hair pins and              piercings  Do not wear jewelry, make-up, lotions, powders or perfumes, deodorant             Do not wear nail polish.  Do not shave  48 hours prior to surgery.                Do not bring valuables to the hospital. [place].  Contacts, dentures or bridgework may not be worn into surgery.  Leave suitcase in the car. After surgery it may be brought to your room.    Special Instructions:  COUGH/ DEEP BREATHING AND LEG EXERCISES              Please read over the following fact sheets you were given: _____________________________________________________________________             [facility] - Preparing for Surgery Before surgery, you can play an important role.  Because skin is not sterile, your skin needs to be as free of germs as possible.  You can reduce the number of germs on your skin by washing with CHG (chlorahexidine gluconate) soap before surgery.  CHG is an antiseptic cleaner which kills germs and bonds with the skin to continue killing germs even after washing. Please DO NOT use if you have an allergy to CHG or antibacterial soaps.  If your skin  becomes reddened/irritated stop using the CHG and inform your nurse when you arrive at Short Stay. Do not shave (including legs and underarms) for at least 48 hours  prior to the first CHG shower.  You may shave your face/neck. Please follow these instructions carefully:  1.  Shower with CHG Soap the night before surgery and the  morning of Surgery.  2.  If you choose to wash your hair, wash your hair first as usual with your  normal  shampoo.  3.  After you shampoo, rinse your hair and body thoroughly to remove the  shampoo.                           4.  Use CHG as you would any other liquid soap.  You can apply chg directly  to the skin and wash                       Gently with a scrungie or clean washcloth.  5.  Apply the CHG Soap to your body ONLY FROM THE NECK DOWN.   Do not use on face/ open                           Wound or open sores. Avoid contact with eyes, ears mouth and genitals (private parts).                       Wash face,  Genitals (private parts) with your normal soap.             6.  Wash thoroughly, paying special attention to the area where your surgery  will be performed.  7.  Thoroughly rinse your body with warm water from the neck down.  8.  DO NOT shower/wash with your normal soap after using and rinsing off  the CHG Soap.                9.  Pat yourself dry with a clean towel.            10.  Wear clean pajamas.            11.  Place clean sheets on your bed the night of your first shower and do not  sleep with pets. Day of Surgery : Do not apply any lotions/deodorants the morning of surgery.  Please wear clean clothes to the hospital/surgery center.  FAILURE TO FOLLOW THESE INSTRUCTIONS MAY RESULT IN THE CANCELLATION OF YOUR SURGERY PATIENT SIGNATURE_________________________________  NURSE SIGNATURE__________________________________  ________________________________________________________________________  WHAT IS A BLOOD TRANSFUSION? Blood Transfusion Information  A transfusion is the replacement of blood or some of its parts. Blood is made up of multiple cells which provide different functions.  Red blood cells carry  oxygen and are used for blood loss replacement.  White blood cells fight against infection.  Platelets control bleeding.  Plasma helps clot blood.  Other blood products are available for specialized needs, such as hemophilia or other clotting disorders. BEFORE THE TRANSFUSION  Who gives blood for transfusions?   Healthy volunteers who are fully evaluated to make sure their blood is safe. This is blood bank blood. Transfusion therapy is the safest it has ever been in the practice of medicine. Before blood is taken from a donor, a complete history is taken to make sure that person has no history of diseases nor engages in risky  social behavior (examples are intravenous drug use or sexual activity with multiple partners). The donor's travel history is screened to minimize risk of transmitting infections, such as malaria. The donated blood is tested for signs of infectious diseases, such as HIV and hepatitis. The blood is then tested to be sure it is compatible with you in order to minimize the chance of a transfusion reaction. If you or a relative donates blood, this is often done in anticipation of surgery and is not appropriate for emergency situations. It takes many days to process the donated blood. RISKS AND COMPLICATIONS Although transfusion therapy is very safe and saves many lives, the main dangers of transfusion include:   Getting an infectious disease.  Developing a transfusion reaction. This is an allergic reaction to something in the blood you were given. Every precaution is taken to prevent this. The decision to have a blood transfusion has been considered carefully by your caregiver before blood is given. Blood is not given unless the benefits outweigh the risks. AFTER THE TRANSFUSION  Right after receiving a blood transfusion, you will usually feel much better and more energetic. This is especially true if your red blood cells have gotten low (anemic). The transfusion raises the  level of the red blood cells which carry oxygen, and this usually causes an energy increase.  The nurse administering the transfusion will monitor you carefully for complications. HOME CARE INSTRUCTIONS  No special instructions are needed after a transfusion. You may find your energy is better. Speak with your caregiver about any limitations on activity for underlying diseases you may have. SEEK MEDICAL CARE IF:   Your condition is not improving after your transfusion.  You develop redness or irritation at the intravenous (IV) site. SEEK IMMEDIATE MEDICAL CARE IF:  Any of the following symptoms occur over the next 12 hours:  Shaking chills.  You have a temperature by mouth above 102 F (38.9 C), not controlled by medicine.  Chest, back, or muscle pain.  People around you feel you are not acting correctly or are confused.  Shortness of breath or difficulty breathing.  Dizziness and fainting.  You get a rash or develop hives.  You have a decrease in urine output.  Your urine turns a dark color or changes to pink, red, or brown. Any of the following symptoms occur over the next 10 days:  You have a temperature by mouth above 102 F (38.9 C), not controlled by medicine.  Shortness of breath.  Weakness after normal activity.  The white part of the eye turns yellow (jaundice).  You have a decrease in the amount of urine or are urinating less often.  Your urine turns a dark color or changes to pink, red, or brown. Document Released: 09/21/2000 Document Revised: 12/17/2011 Document Reviewed: 05/10/2008 ExitCare Patient Information 2014 [place].  _______________________________________________________________________  Incentive Spirometer  An incentive spirometer is a tool that can help keep your lungs clear and active. This tool measures how well you are filling your lungs with each breath. Taking long deep breaths may help reverse or decrease the chance of  developing breathing (pulmonary) problems (especially infection) following:  A long period of time when you are unable to move or be active. BEFORE THE PROCEDURE   If the spirometer includes an indicator to show your best effort, your nurse or respiratory therapist will set it to a desired goal.  If possible, sit up straight or lean slightly forward. Try not to slouch.  Hold the incentive  spirometer in an upright position. INSTRUCTIONS FOR USE  1. Sit on the edge of your bed if possible, or sit up as far as you can in bed or on a chair. 2. Hold the incentive spirometer in an upright position. 3. Breathe out normally. 4. Place the mouthpiece in your mouth and seal your lips tightly around it. 5. Breathe in slowly and as deeply as possible, raising the piston or the ball toward the top of the column. 6. Hold your breath for 3-5 seconds or for as long as possible. Allow the piston or ball to fall to the bottom of the column. 7. Remove the mouthpiece from your mouth and breathe out normally. 8. Rest for a few seconds and repeat Steps 1 through 7 at least 10 times every 1-2 hours when you are awake. Take your time and take a few normal breaths between deep breaths. 9. The spirometer may include an indicator to show your best effort. Use the indicator as a goal to work toward during each repetition. 10. After each set of 10 deep breaths, practice coughing to be sure your lungs are clear. If you have an incision (the cut made at the time of surgery), support your incision when coughing by placing a pillow or rolled up towels firmly against it. Once you are able to get out of bed, walk around indoors and cough well. You may stop using the incentive spirometer when instructed by your caregiver.  RISKS AND COMPLICATIONS  Take your time so you do not get dizzy or light-headed.  If you are in pain, you may need to take or ask for pain medication before doing incentive spirometry. It is harder to take a  deep breath if you are having pain. AFTER USE  Rest and breathe slowly and easily.  It can be helpful to keep track of a log of your progress. Your caregiver can provide you with a simple table to help with this. If you are using the spirometer at home, follow these instructions: [place] IF:   You are having difficultly using the spirometer.  You have trouble using the spirometer as often as instructed.  Your pain medication is not giving enough relief while using the spirometer.  You develop fever of 100.5 F (38.1 C) or higher. SEEK IMMEDIATE MEDICAL CARE IF:   You cough up bloody sputum that had not been present before.  You develop fever of 102 F (38.9 C) or greater.  You develop worsening pain at or near the incision site. MAKE SURE YOU:   Understand these instructions.  Will watch your condition.  Will get help right away if you are not doing well or get worse. Document Released: 02/04/2007 Document Revised: 12/17/2011 Document Reviewed: 04/07/2007 [facility] Patient Information 2014 [place], [place].   ________________________________________________________________________

## 2018-03-18 ENCOUNTER — Ambulatory Visit: Payer: Self-pay | Admitting: Cardiology

## 2018-03-20 ENCOUNTER — Encounter (HOSPITAL_COMMUNITY): Payer: Self-pay | Admitting: Registered Nurse

## 2018-03-20 ENCOUNTER — Inpatient Hospital Stay (HOSPITAL_COMMUNITY): Payer: Medicare Other | Admitting: Registered Nurse

## 2018-03-20 ENCOUNTER — Encounter (HOSPITAL_COMMUNITY)
Admission: RE | Disposition: A | Discharge status: Home / Self Care | Payer: Self-pay | Source: Home / Self Care | Attending: Obstetrics

## 2018-03-20 ENCOUNTER — Inpatient Hospital Stay (HOSPITAL_COMMUNITY)
Admission: RE | Admit: 2018-03-20 | Discharge: 2018-03-23 | DRG: 737 | Disposition: A | Discharge status: Home / Self Care | Payer: Medicare Other | Attending: Obstetrics | Admitting: Obstetrics

## 2018-03-20 DIAGNOSIS — Z7901 Long term (current) use of anticoagulants: Secondary | ICD-10-CM

## 2018-03-20 DIAGNOSIS — Z801 Family history of malignant neoplasm of trachea, bronchus and lung: Secondary | ICD-10-CM

## 2018-03-20 DIAGNOSIS — Z9049 Acquired absence of other specified parts of digestive tract: Secondary | ICD-10-CM

## 2018-03-20 DIAGNOSIS — I5032 Chronic diastolic (congestive) heart failure: Secondary | ICD-10-CM | POA: Diagnosis present

## 2018-03-20 DIAGNOSIS — M810 Age-related osteoporosis without current pathological fracture: Secondary | ICD-10-CM | POA: Diagnosis present

## 2018-03-20 DIAGNOSIS — Z9842 Cataract extraction status, left eye: Secondary | ICD-10-CM | POA: Diagnosis not present

## 2018-03-20 DIAGNOSIS — Z79899 Other long term (current) drug therapy: Secondary | ICD-10-CM

## 2018-03-20 DIAGNOSIS — E039 Hypothyroidism, unspecified: Secondary | ICD-10-CM | POA: Diagnosis present

## 2018-03-20 DIAGNOSIS — R12 Heartburn: Secondary | ICD-10-CM

## 2018-03-20 DIAGNOSIS — I4891 Unspecified atrial fibrillation: Secondary | ICD-10-CM | POA: Diagnosis present

## 2018-03-20 DIAGNOSIS — I480 Paroxysmal atrial fibrillation: Secondary | ICD-10-CM | POA: Diagnosis present

## 2018-03-20 DIAGNOSIS — T461X6A Underdosing of calcium-channel blockers, initial encounter: Secondary | ICD-10-CM | POA: Diagnosis present

## 2018-03-20 DIAGNOSIS — Z87891 Personal history of nicotine dependence: Secondary | ICD-10-CM

## 2018-03-20 DIAGNOSIS — C569 Malignant neoplasm of unspecified ovary: Secondary | ICD-10-CM | POA: Diagnosis present

## 2018-03-20 DIAGNOSIS — K5190 Ulcerative colitis, unspecified, without complications: Secondary | ICD-10-CM | POA: Diagnosis present

## 2018-03-20 DIAGNOSIS — C562 Malignant neoplasm of left ovary: Secondary | ICD-10-CM | POA: Diagnosis present

## 2018-03-20 DIAGNOSIS — Z79890 Hormone replacement therapy (postmenopausal): Secondary | ICD-10-CM | POA: Diagnosis not present

## 2018-03-20 DIAGNOSIS — M1990 Unspecified osteoarthritis, unspecified site: Secondary | ICD-10-CM | POA: Diagnosis present

## 2018-03-20 DIAGNOSIS — K219 Gastro-esophageal reflux disease without esophagitis: Secondary | ICD-10-CM | POA: Diagnosis present

## 2018-03-20 DIAGNOSIS — I739 Peripheral vascular disease, unspecified: Secondary | ICD-10-CM | POA: Diagnosis present

## 2018-03-20 DIAGNOSIS — C786 Secondary malignant neoplasm of retroperitoneum and peritoneum: Secondary | ICD-10-CM | POA: Diagnosis present

## 2018-03-20 DIAGNOSIS — I4892 Unspecified atrial flutter: Secondary | ICD-10-CM | POA: Diagnosis present

## 2018-03-20 DIAGNOSIS — L4050 Arthropathic psoriasis, unspecified: Secondary | ICD-10-CM | POA: Diagnosis present

## 2018-03-20 DIAGNOSIS — Z8249 Family history of ischemic heart disease and other diseases of the circulatory system: Secondary | ICD-10-CM

## 2018-03-20 DIAGNOSIS — Y92009 Unspecified place in unspecified non-institutional (private) residence as the place of occurrence of the external cause: Secondary | ICD-10-CM | POA: Diagnosis not present

## 2018-03-20 DIAGNOSIS — Z800 Family history of malignant neoplasm of digestive organs: Secondary | ICD-10-CM

## 2018-03-20 DIAGNOSIS — Z9841 Cataract extraction status, right eye: Secondary | ICD-10-CM | POA: Diagnosis not present

## 2018-03-20 DIAGNOSIS — J302 Other seasonal allergic rhinitis: Secondary | ICD-10-CM | POA: Diagnosis present

## 2018-03-20 DIAGNOSIS — M797 Fibromyalgia: Secondary | ICD-10-CM | POA: Diagnosis present

## 2018-03-20 DIAGNOSIS — C561 Malignant neoplasm of right ovary: Secondary | ICD-10-CM | POA: Diagnosis present

## 2018-03-20 DIAGNOSIS — I110 Hypertensive heart disease with heart failure: Secondary | ICD-10-CM | POA: Diagnosis present

## 2018-03-20 DIAGNOSIS — Z91138 Patient's unintentional underdosing of medication regimen for other reason: Secondary | ICD-10-CM | POA: Diagnosis not present

## 2018-03-20 DIAGNOSIS — Z885 Allergy status to narcotic agent status: Secondary | ICD-10-CM

## 2018-03-20 DIAGNOSIS — Z972 Presence of dental prosthetic device (complete) (partial): Secondary | ICD-10-CM

## 2018-03-20 DIAGNOSIS — J449 Chronic obstructive pulmonary disease, unspecified: Secondary | ICD-10-CM | POA: Diagnosis present

## 2018-03-20 DIAGNOSIS — Z96652 Presence of left artificial knee joint: Secondary | ICD-10-CM | POA: Diagnosis present

## 2018-03-20 DIAGNOSIS — Z961 Presence of intraocular lens: Secondary | ICD-10-CM | POA: Diagnosis present

## 2018-03-20 HISTORY — PX: DEBULKING: SHX6277

## 2018-03-20 HISTORY — PX: OMENTECTOMY: SHX5985

## 2018-03-20 HISTORY — PX: HYSTERECTOMY ABDOMINAL WITH SALPINGO-OOPHORECTOMY: SHX6792

## 2018-03-20 HISTORY — PX: LAPAROTOMY: SHX154

## 2018-03-20 LAB — TYPE AND SCREEN
ABO/RH(D): O POS
Antibody Screen: NEGATIVE

## 2018-03-20 SURGERY — LAPAROTOMY, EXPLORATORY
Anesthesia: General

## 2018-03-20 MED ORDER — FENTANYL CITRATE (PF) 100 MCG/2ML IJ SOLN
INTRAMUSCULAR | Status: AC
  Filled 2018-03-20: qty 2

## 2018-03-20 MED ORDER — ALPRAZOLAM 0.25 MG PO TABS: 0.2500 mg | ORAL_TABLET | Freq: Every evening | ORAL | Status: DC | PRN

## 2018-03-20 MED ORDER — LIDOCAINE 2% (20 MG/ML) 5 ML SYRINGE
INTRAMUSCULAR | Status: DC | PRN
  Administered 2018-03-20: 80 mg via INTRAVENOUS

## 2018-03-20 MED ORDER — BUPIVACAINE-EPINEPHRINE (PF) 0.25% -1:200000 IJ SOLN
INTRAMUSCULAR | Status: DC | PRN
Start: 2018-03-20 — End: 2018-03-20
  Administered 2018-03-20 (×2): 25 mL via PERINEURAL

## 2018-03-20 MED ORDER — ALBUMIN HUMAN 5 % IV SOLN
INTRAVENOUS | Status: DC | PRN
  Administered 2018-03-20 (×2): via INTRAVENOUS

## 2018-03-20 MED ORDER — ACETAMINOPHEN 500 MG PO TABS
1000.0000 mg | ORAL_TABLET | Freq: Two times a day (BID) | ORAL | Status: DC
  Administered 2018-03-20 – 2018-03-23 (×5): 1000 mg via ORAL
  Filled 2018-03-20 (×6): qty 2

## 2018-03-20 MED ORDER — ONDANSETRON HCL 4 MG/2ML IJ SOLN
INTRAMUSCULAR | Status: DC | PRN
  Administered 2018-03-20: 4 mg via INTRAVENOUS

## 2018-03-20 MED ORDER — ENOXAPARIN SODIUM 40 MG/0.4ML ~~LOC~~ SOLN
40.0000 mg | SUBCUTANEOUS | Status: DC
  Administered 2018-03-21 – 2018-03-23 (×3): 40 mg via SUBCUTANEOUS
  Filled 2018-03-20 (×3): qty 0.4

## 2018-03-20 MED ORDER — GABAPENTIN 300 MG PO CAPS
300.0000 mg | ORAL_CAPSULE | ORAL | Status: AC
  Administered 2018-03-20: 300 mg via ORAL
  Filled 2018-03-20: qty 1

## 2018-03-20 MED ORDER — OXYCODONE HCL 5 MG PO TABS
5.0000 mg | ORAL_TABLET | ORAL | Status: DC | PRN
  Administered 2018-03-21: 5 mg via ORAL
  Filled 2018-03-20: qty 1

## 2018-03-20 MED ORDER — NON FORMULARY: 1.0000 [IU] | Freq: Three times a day (TID) | Status: DC

## 2018-03-20 MED ORDER — DULOXETINE HCL 30 MG PO CPEP
30.0000 mg | ORAL_CAPSULE | Freq: Every day | ORAL | Status: DC
  Administered 2018-03-20 – 2018-03-22 (×3): 30 mg via ORAL
  Filled 2018-03-20 (×3): qty 1

## 2018-03-20 MED ORDER — ENOXAPARIN SODIUM 40 MG/0.4ML ~~LOC~~ SOLN
40.0000 mg | SUBCUTANEOUS | Status: AC
  Administered 2018-03-20: 40 mg via SUBCUTANEOUS
  Filled 2018-03-20: qty 0.4

## 2018-03-20 MED ORDER — ONDANSETRON HCL 4 MG/2ML IJ SOLN
4.0000 mg | Freq: Four times a day (QID) | INTRAMUSCULAR | Status: DC | PRN
  Filled 2018-03-20: qty 2

## 2018-03-20 MED ORDER — DOCUSATE SODIUM 100 MG PO CAPS
100.0000 mg | ORAL_CAPSULE | Freq: Two times a day (BID) | ORAL | Status: DC
  Administered 2018-03-20 – 2018-03-23 (×6): 100 mg via ORAL
  Filled 2018-03-20 (×6): qty 1

## 2018-03-20 MED ORDER — MIDAZOLAM HCL 2 MG/2ML IJ SOLN: 1.0000 mg | INTRAMUSCULAR | Status: DC

## 2018-03-20 MED ORDER — SUGAMMADEX SODIUM 200 MG/2ML IV SOLN
INTRAVENOUS | Status: DC | PRN
  Administered 2018-03-20: 200 mg via INTRAVENOUS

## 2018-03-20 MED ORDER — SODIUM CHLORIDE 0.9 % IV SOLN
2.0000 g | INTRAVENOUS | Status: AC
  Administered 2018-03-20: 2 g via INTRAVENOUS
  Filled 2018-03-20: qty 2

## 2018-03-20 MED ORDER — STERILE WATER FOR IRRIGATION IR SOLN
Status: DC | PRN
  Administered 2018-03-20: 2000 mL

## 2018-03-20 MED ORDER — ONDANSETRON HCL 4 MG/2ML IJ SOLN: 4.0000 mg | Freq: Once | INTRAMUSCULAR | Status: DC | PRN

## 2018-03-20 MED ORDER — FENTANYL CITRATE (PF) 250 MCG/5ML IJ SOLN
INTRAMUSCULAR | Status: AC
  Filled 2018-03-20: qty 5

## 2018-03-20 MED ORDER — ACETAMINOPHEN 500 MG PO TABS
1000.0000 mg | ORAL_TABLET | ORAL | Status: AC
  Administered 2018-03-20: 1000 mg via ORAL
  Filled 2018-03-20: qty 2

## 2018-03-20 MED ORDER — ALBUTEROL SULFATE (2.5 MG/3ML) 0.083% IN NEBU: 3.0000 mL | INHALATION_SOLUTION | Freq: Four times a day (QID) | RESPIRATORY_TRACT | Status: DC | PRN

## 2018-03-20 MED ORDER — KCL IN DEXTROSE-NACL 20-5-0.45 MEQ/L-%-% IV SOLN
INTRAVENOUS | Status: DC
  Administered 2018-03-20: 17:00:00 via INTRAVENOUS
  Filled 2018-03-20: qty 1000

## 2018-03-20 MED ORDER — TIOTROPIUM BROMIDE-OLODATEROL 2.5-2.5 MCG/ACT IN AERS
2.0000 | INHALATION_SPRAY | Freq: Every day | RESPIRATORY_TRACT | Status: DC
  Administered 2018-03-23: 2 via RESPIRATORY_TRACT

## 2018-03-20 MED ORDER — DILTIAZEM HCL ER COATED BEADS 120 MG PO CP24
120.0000 mg | ORAL_CAPSULE | Freq: Every evening | ORAL | Status: DC
  Administered 2018-03-21 – 2018-03-22 (×2): 120 mg via ORAL
  Filled 2018-03-20 (×3): qty 1

## 2018-03-20 MED ORDER — FENTANYL CITRATE (PF) 100 MCG/2ML IJ SOLN
50.0000 ug | INTRAMUSCULAR | Status: DC
  Administered 2018-03-20: 25 ug via INTRAVENOUS

## 2018-03-20 MED ORDER — ONDANSETRON HCL 4 MG PO TABS: 4.0000 mg | ORAL_TABLET | Freq: Four times a day (QID) | ORAL | Status: DC | PRN

## 2018-03-20 MED ORDER — ONDANSETRON HCL 4 MG/2ML IJ SOLN
INTRAMUSCULAR | Status: AC
  Filled 2018-03-20: qty 2

## 2018-03-20 MED ORDER — DEXAMETHASONE SODIUM PHOSPHATE 10 MG/ML IJ SOLN
INTRAMUSCULAR | Status: DC | PRN
  Administered 2018-03-20: 10 mg via INTRAVENOUS

## 2018-03-20 MED ORDER — BUDESONIDE 3 MG PO CPEP
9.0000 mg | ORAL_CAPSULE | Freq: Every day | ORAL | Status: DC
  Administered 2018-03-21 – 2018-03-22 (×2): 9 mg via ORAL
  Filled 2018-03-20 (×3): qty 3

## 2018-03-20 MED ORDER — ROCURONIUM BROMIDE 10 MG/ML (PF) SYRINGE
PREFILLED_SYRINGE | INTRAVENOUS | Status: AC
  Filled 2018-03-20: qty 5

## 2018-03-20 MED ORDER — LIDOCAINE 2% (20 MG/ML) 5 ML SYRINGE
INTRAMUSCULAR | Status: AC
  Filled 2018-03-20: qty 5

## 2018-03-20 MED ORDER — ALBUMIN HUMAN 5 % IV SOLN
INTRAVENOUS | Status: AC
  Filled 2018-03-20: qty 500

## 2018-03-20 MED ORDER — MIDAZOLAM HCL 2 MG/2ML IJ SOLN
INTRAMUSCULAR | Status: AC
  Filled 2018-03-20: qty 2

## 2018-03-20 MED ORDER — FLUTICASONE PROPIONATE 50 MCG/ACT NA SUSP: 2.0000 | Freq: Two times a day (BID) | NASAL | Status: DC | PRN

## 2018-03-20 MED ORDER — TRAMADOL HCL 50 MG PO TABS
100.0000 mg | ORAL_TABLET | Freq: Two times a day (BID) | ORAL | Status: DC | PRN
  Filled 2018-03-20: qty 2

## 2018-03-20 MED ORDER — SODIUM CHLORIDE 0.9 % IR SOLN
Status: DC | PRN
  Administered 2018-03-20: 2000 mL

## 2018-03-20 MED ORDER — CELECOXIB 200 MG PO CAPS
400.0000 mg | ORAL_CAPSULE | ORAL | Status: AC
  Administered 2018-03-20: 400 mg via ORAL
  Filled 2018-03-20: qty 2

## 2018-03-20 MED ORDER — PROPOFOL 10 MG/ML IV BOLUS
INTRAVENOUS | Status: DC | PRN
  Administered 2018-03-20: 100 mg via INTRAVENOUS

## 2018-03-20 MED ORDER — SUGAMMADEX SODIUM 200 MG/2ML IV SOLN
INTRAVENOUS | Status: AC
  Filled 2018-03-20: qty 2

## 2018-03-20 MED ORDER — MIRTAZAPINE 15 MG PO TABS
15.0000 mg | ORAL_TABLET | Freq: Every day | ORAL | Status: DC
  Administered 2018-03-20 – 2018-03-22 (×3): 15 mg via ORAL
  Filled 2018-03-20 (×3): qty 1

## 2018-03-20 MED ORDER — FENTANYL CITRATE (PF) 250 MCG/5ML IJ SOLN
INTRAMUSCULAR | Status: DC | PRN
  Administered 2018-03-20: 50 ug via INTRAVENOUS
  Administered 2018-03-20: 100 ug via INTRAVENOUS

## 2018-03-20 MED ORDER — ENSURE ENLIVE PO LIQD
237.0000 mL | Freq: Two times a day (BID) | ORAL | Status: DC
  Administered 2018-03-23: 237 mL via ORAL

## 2018-03-20 MED ORDER — SCOPOLAMINE 1 MG/3DAYS TD PT72
1.0000 | MEDICATED_PATCH | TRANSDERMAL | Status: DC
  Administered 2018-03-20: 1.5 mg via TRANSDERMAL
  Filled 2018-03-20: qty 1

## 2018-03-20 MED ORDER — PREGABALIN 25 MG PO CAPS
25.0000 mg | ORAL_CAPSULE | Freq: Two times a day (BID) | ORAL | Status: DC
  Administered 2018-03-21 – 2018-03-23 (×5): 25 mg via ORAL
  Filled 2018-03-20 (×5): qty 1

## 2018-03-20 MED ORDER — PROPOFOL 10 MG/ML IV BOLUS
INTRAVENOUS | Status: AC
Start: 2018-03-20 — End: ?
  Filled 2018-03-20: qty 20

## 2018-03-20 MED ORDER — CHEWING GUM (ORBIT) SUGAR FREE
1.0000 | CHEWING_GUM | Freq: Three times a day (TID) | ORAL | Status: DC
  Administered 2018-03-21 – 2018-03-23 (×7): 1 via ORAL
  Filled 2018-03-20: qty 1

## 2018-03-20 MED ORDER — DEXAMETHASONE SODIUM PHOSPHATE 10 MG/ML IJ SOLN
INTRAMUSCULAR | Status: AC
  Filled 2018-03-20: qty 1

## 2018-03-20 MED ORDER — LEVOTHYROXINE SODIUM 25 MCG PO TABS
137.0000 ug | ORAL_TABLET | Freq: Every day | ORAL | Status: DC
  Administered 2018-03-21 – 2018-03-23 (×3): 137 ug via ORAL
  Filled 2018-03-20 (×3): qty 1

## 2018-03-20 MED ORDER — FENTANYL CITRATE (PF) 100 MCG/2ML IJ SOLN
INTRAMUSCULAR | Status: AC
  Administered 2018-03-20: 50 ug
  Filled 2018-03-20: qty 2

## 2018-03-20 MED ORDER — DEXAMETHASONE SODIUM PHOSPHATE 4 MG/ML IJ SOLN: 4.0000 mg | INTRAMUSCULAR | Status: DC

## 2018-03-20 MED ORDER — LACTATED RINGERS IV SOLN
INTRAVENOUS | Status: DC
  Administered 2018-03-20 (×2): via INTRAVENOUS

## 2018-03-20 MED ORDER — ROCURONIUM BROMIDE 10 MG/ML (PF) SYRINGE
PREFILLED_SYRINGE | INTRAVENOUS | Status: DC | PRN
Start: 2018-03-20 — End: 2018-03-20
  Administered 2018-03-20: 20 mg via INTRAVENOUS
  Administered 2018-03-20: 50 mg via INTRAVENOUS
  Administered 2018-03-20: 20 mg via INTRAVENOUS

## 2018-03-20 MED ORDER — FENTANYL CITRATE (PF) 100 MCG/2ML IJ SOLN
25.0000 ug | INTRAMUSCULAR | Status: DC | PRN
  Administered 2018-03-20: 25 ug via INTRAVENOUS

## 2018-03-20 MED ORDER — HYDROMORPHONE HCL 1 MG/ML IJ SOLN
0.5000 mg | INTRAMUSCULAR | Status: DC | PRN
  Administered 2018-03-20 – 2018-03-21 (×4): 0.5 mg via INTRAVENOUS
  Filled 2018-03-20 (×4): qty 0.5

## 2018-03-20 MED ORDER — BUDESONIDE-FORMOTEROL FUMARATE 160-4.5 MCG/ACT IN AERO
2.0000 | INHALATION_SPRAY | Freq: Two times a day (BID) | RESPIRATORY_TRACT | Status: DC
  Administered 2018-03-22 – 2018-03-23 (×2): 2 via RESPIRATORY_TRACT

## 2018-03-20 SURGICAL SUPPLY — 47 items
APL SKNCLS STERI-STRIP NONHPOA (GAUZE/BANDAGES/DRESSINGS) ×2
ATTRACTOMAT 16X20 MAGNETIC DRP (DRAPES) ×4 IMPLANT
BENZOIN TINCTURE PRP APPL 2/3 (GAUZE/BANDAGES/DRESSINGS) ×2 IMPLANT
BLADE EXTENDED COATED 6.5IN (ELECTRODE) ×4 IMPLANT
BRR ADH 6X5 SEPRAFILM 1 SHT (MISCELLANEOUS)
CHLORAPREP W/TINT 26ML (MISCELLANEOUS) ×4 IMPLANT
CLIP VESOCCLUDE LG 6/CT (CLIP) ×4 IMPLANT
CLIP VESOCCLUDE MED 6/CT (CLIP) ×4 IMPLANT
CLOSURE WOUND 1/2 X4 (GAUZE/BANDAGES/DRESSINGS) ×1
CONT SPEC 4OZ CLIKSEAL STRL BL (MISCELLANEOUS) IMPLANT
DRAPE INCISE IOBAN 66X45 STRL (DRAPES) IMPLANT
DRAPE UNDERBUTTOCKS STRL (DRAPE) ×4 IMPLANT
DRAPE WARM FLUID 44X44 (DRAPE) ×4 IMPLANT
DRSG OPSITE POSTOP 4X10 (GAUZE/BANDAGES/DRESSINGS) ×2 IMPLANT
ELECT REM PT RETURN 15FT ADLT (MISCELLANEOUS) ×4 IMPLANT
GAUZE 4X4 16PLY RFD (DISPOSABLE) ×2 IMPLANT
GAUZE SPONGE 4X4 12PLY STRL (GAUZE/BANDAGES/DRESSINGS) ×2 IMPLANT
GLOVE BIOGEL PI IND STRL 7.0 (GLOVE) ×4 IMPLANT
GLOVE BIOGEL PI INDICATOR 7.0 (GLOVE) ×4
GLOVE SURG SS PI 6.5 STRL IVOR (GLOVE) ×8 IMPLANT
GOWN STRL REUS W/ TWL LRG LVL3 (GOWN DISPOSABLE) ×2 IMPLANT
GOWN STRL REUS W/TWL LRG LVL3 (GOWN DISPOSABLE) ×8 IMPLANT
KIT BASIN OR (CUSTOM PROCEDURE TRAY) ×4 IMPLANT
LIGASURE IMPACT 36 18CM CVD LR (INSTRUMENTS) ×4 IMPLANT
NEEDLE HYPO 22GX1.5 SAFETY (NEEDLE) ×8 IMPLANT
NS IRRIG 1000ML POUR BTL (IV SOLUTION) ×4 IMPLANT
PACK GENERAL/GYN (CUSTOM PROCEDURE TRAY) ×4 IMPLANT
RETAINER VISCERA MED (MISCELLANEOUS) ×2 IMPLANT
SEPRAFILM MEMBRANE 5X6 (MISCELLANEOUS) IMPLANT
SHEET LAVH (DRAPES) ×4 IMPLANT
SPONGE LAP 18X18 RF (DISPOSABLE) ×2 IMPLANT
STRIP CLOSURE SKIN 1/2X4 (GAUZE/BANDAGES/DRESSINGS) ×1 IMPLANT
SUCTION POOLE TIP (SUCTIONS) ×2 IMPLANT
SUT MNCRL AB 4-0 PS2 18 (SUTURE) ×14 IMPLANT
SUT PDS AB 1 TP1 96 (SUTURE) ×8 IMPLANT
SUT PLAIN 2 0 XLH (SUTURE) IMPLANT
SUT SILK 2 0 (SUTURE)
SUT SILK 2-0 18XBRD TIE 12 (SUTURE) IMPLANT
SUT VIC AB 0 CT1 18XCR BRD 8 (SUTURE) ×2 IMPLANT
SUT VIC AB 0 CT1 36 (SUTURE) ×32 IMPLANT
SUT VIC AB 0 CT1 8-18 (SUTURE) ×4
SUT VIC AB 4-0 PS2 18 (SUTURE) ×10 IMPLANT
SUT VICRYL 0 TIES 12 18 (SUTURE) ×4 IMPLANT
SYR 30ML LL (SYRINGE) ×8 IMPLANT
TOWEL OR 17X26 10 PK STRL BLUE (TOWEL DISPOSABLE) ×4 IMPLANT
TOWEL OR NON WOVEN STRL DISP B (DISPOSABLE) ×4 IMPLANT
TRAY FOLEY MTR SLVR 14FR STAT (SET/KITS/TRAYS/PACK) ×4 IMPLANT

## 2018-03-20 NOTE — Anesthesia Preprocedure Evaluation (Addendum)
Anesthesia Evaluation  Patient identified by MRN, date of birth, ID band Patient awake    Reviewed: Allergy & Precautions, NPO status , Patient's Chart, lab work & pertinent test results  Airway Mallampati: II  TM Distance: >3 FB Neck ROM: Full    Dental  (+) Dental Advisory Given, Partial Lower   Pulmonary COPD, former smoker,  Pleural effusion  s/p  right thoracentesis, 02-2018 and 03-17-2018   Pulmonary exam normal breath sounds clear to auscultation       Cardiovascular hypertension, Pt. on medications + Peripheral Vascular Disease and +CHF  Normal cardiovascular exam+ dysrhythmias (PAF) Atrial Fibrillation  Rhythm:Regular Rate:Normal  Echo 08/20/17 Normal LV size with EF 60-65%. Normal RV size and systolic function. No significant valvular abnormalities.   Neuro/Psych PSYCHIATRIC DISORDERS Anxiety Depression  Neuromuscular disease    GI/Hepatic Neg liver ROS, hiatal hernia, GERD  ,  Endo/Other  Hypothyroidism   Renal/GU negative Renal ROS     Musculoskeletal  (+) Arthritis , Fibromyalgia -  Abdominal   Peds  Hematology  (+) Blood dyscrasia (Xarelto), anemia ,   Anesthesia Other Findings Day of surgery medications reviewed with the patient.  Reproductive/Obstetrics Ovarian cancer with malignant ascites                            Anesthesia Physical Anesthesia Plan  ASA: IV  Anesthesia Plan: General   Post-op Pain Management:  Regional for Post-op pain   Induction: Intravenous  PONV Risk Score and Plan: 4 or greater and Dexamethasone, Ondansetron and Treatment may vary due to age or medical condition  Airway Management Planned: Oral ETT  Additional Equipment:   Intra-op Plan:   Post-operative Plan: Extubation in OR  Informed Consent: I have reviewed the patients History and Physical, chart, labs and discussed the procedure including the risks, benefits and alternatives for  the proposed anesthesia with the patient or authorized representative who has indicated his/her understanding and acceptance.   Dental advisory given  Plan Discussed with: CRNA, Anesthesiologist and Surgeon  Anesthesia Plan Comments: (Bilateral TAP blocks.)        Anesthesia Quick Evaluation

## 2018-03-20 NOTE — Anesthesia Procedure Notes (Signed)
Procedure Name: Intubation Date/Time: 03/20/2018 12:02 PM Performed by: Talbot Grumbling, CRNA Pre-anesthesia Checklist: Patient identified, Emergency Drugs available, Suction available and Patient being monitored Patient Re-evaluated:Patient Re-evaluated prior to induction Oxygen Delivery Method: Circle system utilized Preoxygenation: Pre-oxygenation with 100% oxygen Induction Type: IV induction Ventilation: Mask ventilation without difficulty Laryngoscope Size: Mac and 3 Grade View: Grade II Tube type: Oral Tube size: 7.5 mm Number of attempts: 1 Airway Equipment and Method: Stylet Placement Confirmation: ETT inserted through vocal cords under direct vision,  positive ETCO2 and breath sounds checked- equal and bilateral Secured at: 23 cm Tube secured with: Tape Dental Injury: Teeth and Oropharynx as per pre-operative assessment

## 2018-03-20 NOTE — Anesthesia Postprocedure Evaluation (Signed)
Anesthesia Post Note  Patient: [name]  Procedure(s) Performed: EXPLORATORY LAPAROTOMY (N/A ) TOTAL HYSTERECTOMY ABDOMINAL WITH BILATERAL  SALPINGO-OOPHORECTOMY (Bilateral ) OMENTECTOMY (N/A ) DEBULKING (N/A )     Patient location during evaluation: PACU Anesthesia Type: General Level of consciousness: awake and alert Pain management: pain level controlled Vital Signs Assessment: post-procedure vital signs reviewed and stable Respiratory status: spontaneous breathing, nonlabored ventilation, respiratory function stable and patient connected to nasal cannula oxygen Cardiovascular status: blood pressure returned to baseline and stable Postop Assessment: no apparent nausea or vomiting Anesthetic complications: no    Last Vitals:  Vitals:   03/20/18 1615 03/20/18 2002  BP: (!) 116/46 (!) 111/47  Pulse: 77 72  Resp: 13 18  Temp: (!) 36.3 C 36.6 C  SpO2: 97% 97%    Last Pain:  Vitals:   03/20/18 2002  TempSrc: Oral  PainSc:                  [name]

## 2018-03-20 NOTE — Op Note (Addendum)
OPERATIVE REPORT Date: 03/20/18  Preoperative Diagnosis:  Platinum sensitive ovarian cancer, s/p neoadjuvant chemotherapy with residual disease.  Postoperative Diagnosis: same.   Procedure(s) Performed:  1. Exploratory laparotomy with total hysterectomy and bilateral salpingo-oophorectomy 2. Infragastic Omentectomy  3. Debulking to <1cm gross residual disease   Surgeon: Mart Piggs, MD  Assistant Surgeon: Lahoma Crocker, M.D. (an MD assistant was necessary for tissue manipulation, management of instrumentation, retraction and positioning due to the complexity of the case and hospital policies).  Anesthesia: GETA and TAP block  Specimens: Uterus Cervix, Bilateral tubes / ovaries and omentum. Mesenteric nodule.  Estimated Blood Loss: 100 mL.    Complications: None.   Operative Findings: Debulked to gross residual disease <1cm; however there is miliary disease in multiple locations including the majority of the abdominal peritoneum (anterior abdominal wall, bilateral gutters), diaphragm (Right>left), majority of small bowel mesentary. Normal appendix. Normal small uterus. Right ovary with a cystic lesion ~3cm, some adhesive disease of right adnexa to rectum/sigmoid. Gross omental disease, which was resected with the omentectomy. Smooth liver surface, but again, diaphragmatic disease noted.  Procedure in Detail:    The patient was taken to the operating room and general endotracheal anesthesia performed. The patient placed in dorsal lithotomy position with arms out. She was prepped and draped in a sterile fashion. Timeout was performed.    A Foley catheter was placed to gravity by me.    A midline vertical incision was made and carried through the subcutaneous tissue to the fascia. The fascial incision was made and extended superiorally. The rectus muscles were separated. The peritoneum was identified and entered. Peritoneal incision was extended longitudinally.  The  abdominal cavity was entered sharply and without incident. A Bookwalter retractor was then placed. A survey of the abdomen and pelvis revealed the above findings.   After packing the bowel into the upper abdomen, the right adnexa was isolated and elevated and adhesiolysis performed to separate it from the rectum and pelvic sidewall. The right pelvic sidewall was opened traveling parallel and lateral to the IP ligament.  The right ureter was visualized and noted to peristalse.  The IP ligament was then isolated, clamped, transected, and ligated.  The adnexa was skeletonized taking down the broad ligament to the uteroovarian ligament. The uteroovarian ligament along with the tubal insertion site was clamped and transected. The specimen was set aside as right tube and ovary for permanent section.  The left retroperitoneal space was opened and the left ureter identified. The IP ligament was then isolated, clamped,  transected, and ligated.  The adnexa was skeletonized taking down the broad ligament to the uteroovarian ligament.   Kelly clamps were placed on both uterine cornua. The round ligaments were identified, suture ligated, and transected with the bovie. The anterior peritoneal reflection was incised and the bladder was dissected off the lower uterine segment. Serial pedicles of the cardinal and utero-sacral ligaments were clamped, cut, and suture ligated with 0-Vicryl. Entrance was made into the vagina and the cervix amputated. The vaginal cuff angles were suture ligated with 0-Vicryl suture. The vaginal cuff was then closed with 0-Vicryl figure of 8 interrupted sutures. The pelvis was copiously irrigated. Hemostasis was observed.  Infragastric omentectomy was performed incorporating what appeared to be the diseased regions in the infragastric portion, and then also including normal omentum in the infracolic portion.   The bowel was run and again miliary disease throughout as noted. One of the larger  mesenteric nodules was removed.  The fascia  was reapproximated with 0 looped PDS using a total of two sutures. The subcutaneous layer was then irrigated copiously.  The subcutaneous layer was then reapproximated with interrupted 4-0 Vicryl in the dermal layer and then running 4-0 Monocryl subcuticular.  The patient tolerated the procedure well. Sponge, lap and needle counts were correct x 2.

## 2018-03-20 NOTE — Anesthesia Procedure Notes (Addendum)
Anesthesia Regional Block: TAP block   Pre-Anesthetic Checklist: ,, timeout performed, Correct Patient, Correct Site, Correct Laterality, Correct Procedure, Correct Position, site marked, Risks and benefits discussed,  Surgical consent,  Pre-op evaluation,  At surgeon's request and post-op pain management  Laterality: Right and Left  Prep: chloraprep       Needles:  Injection technique: Single-shot  Needle Type: Echogenic Needle     Needle Length: 9cm  Needle Gauge: 21     Additional Needles:   Procedures:,,,, ultrasound used (permanent image in chart),,,,  Narrative:  Start time: 03/20/2018 11:40 AM End time: 03/20/2018 11:48 AM Injection made incrementally with aspirations every 5 mL.  Performed by: Personally  Anesthesiologist: [name], MD  Additional Notes: No pain on injection. No increased resistance to injection. Injection made in 5cc increments.  Good needle visualization.  Patient tolerated procedure well.  BILATERAL TAP BLOCKS

## 2018-03-20 NOTE — Progress Notes (Signed)
AssistedDr. Turk with right, left, ultrasound guided, transabdominal plane block. Side rails up, monitors on throughout procedure. See vital signs in flow sheet. Tolerated Procedure well.  

## 2018-03-20 NOTE — Transfer of Care (Signed)
Immediate Anesthesia Transfer of Care Note  Patient: [name]  Procedure(s) Performed: EXPLORATORY LAPAROTOMY (N/A ) TOTAL HYSTERECTOMY ABDOMINAL WITH BILATERAL  SALPINGO-OOPHORECTOMY (Bilateral ) OMENTECTOMY (N/A ) DEBULKING (N/A )  Patient Location: PACU  Anesthesia Type:General  Level of Consciousness: awake, alert  and oriented  Airway & Oxygen Therapy: Patient Spontanous Breathing and Patient connected to face mask oxygen  Post-op Assessment: Report given to RN and Post -op Vital signs reviewed and stable  Post vital signs: Reviewed and stable  Last Vitals:  Vitals Value Taken Time  BP    Temp    Pulse    Resp    SpO2      Last Pain:  Vitals:   03/20/18 1149  TempSrc:   PainSc: 0-No pain         Complications: No apparent anesthesia complications

## 2018-03-20 NOTE — Interval H&P Note (Signed)
History and Physical Interval Note:  03/20/2018 11:06 AM  [name]  has presented today for surgery, with the diagnosis of OVARIAN CANCER  The various methods of treatment have been discussed with the patient and family. After consideration of risks, benefits and other options for treatment, the patient has consented to  Procedure(s): EXPLORATORY LAPAROTOMY (N/A) TOTAL HYSTERECTOMY ABDOMINAL WITH BILATERAL  SALPINGO-OOPHORECTOMY (Bilateral) OMENTECTOMY (N/A) DEBULKING (N/A) as a surgical intervention .  The patient's history has been reviewed, patient examined, no change in status, stable for surgery.  I have reviewed the patient's chart and labs.  Questions were answered to the patient's satisfaction.     Isabel Caprice

## 2018-03-21 ENCOUNTER — Encounter (HOSPITAL_COMMUNITY): Payer: Self-pay | Admitting: Obstetrics

## 2018-03-21 ENCOUNTER — Telehealth: Payer: Self-pay | Admitting: Oncology

## 2018-03-21 ENCOUNTER — Other Ambulatory Visit: Payer: Self-pay

## 2018-03-21 LAB — BASIC METABOLIC PANEL
ANION GAP: 5 (ref 5–15)
BUN: 12 mg/dL (ref 6–20)
CO2: 29 mmol/L (ref 22–32)
Calcium: 8 mg/dL — ABNORMAL LOW (ref 8.9–10.3)
Chloride: 104 mmol/L (ref 101–111)
Creatinine, Ser: 0.81 mg/dL (ref 0.44–1.00)
GFR calc Af Amer: >60 mL/min (ref >60)
GFR calc non Af Amer: >60 mL/min (ref >60)
GLUCOSE: 169 mg/dL — AB (ref 65–99)
Potassium: 4.8 mmol/L (ref 3.5–5.1)
Sodium: 138 mmol/L (ref 135–145)

## 2018-03-21 LAB — CBC
HEMATOCRIT: 28.9 % — AB (ref 36.0–46.0)
Hemoglobin: 9.7 g/dL — ABNORMAL LOW (ref 12.0–15.0)
MCH: 33.1 pg (ref 26.0–34.0)
MCHC: 33.6 g/dL (ref 30.0–36.0)
MCV: 98.6 fL (ref 78.0–100.0)
Platelets: 178 10*3/uL (ref 150–400)
RBC: 2.93 MIL/uL — AB (ref 3.87–5.11)
RDW: 14 % (ref 11.5–15.5)
WBC: 8.5 10*3/uL (ref 4.0–10.5)

## 2018-03-21 MED ORDER — SODIUM CHLORIDE 0.9 % IV SOLN
INTRAVENOUS | Status: DC
  Administered 2018-03-21 (×2): via INTRAVENOUS

## 2018-03-21 MED ORDER — CALCIUM CARBONATE ANTACID 500 MG PO CHEW
1.0000 | CHEWABLE_TABLET | Freq: Three times a day (TID) | ORAL | Status: DC | PRN
  Administered 2018-03-21: 200 mg via ORAL
  Filled 2018-03-21: qty 1

## 2018-03-21 MED ORDER — PANTOPRAZOLE SODIUM 40 MG PO TBEC
40.0000 mg | DELAYED_RELEASE_TABLET | Freq: Every day | ORAL | Status: DC
  Administered 2018-03-21 – 2018-03-23 (×3): 40 mg via ORAL
  Filled 2018-03-21 (×3): qty 1

## 2018-03-21 NOTE — Evaluation (Signed)
Physical Therapy Evaluation Patient Details Name: [name] MRN: [id number] DOB: [birth year] Today's Date: 03/21/2018   History of Present Illness    [name] is a 84 y.o. female with history of ovarian carcinoma, COPD, chronic diastolic CHF, atrial fibrillation hypothyroidism, pancytopenia secondary to chemotherapy and history of microscopic colitis presents to the ER after patient had 4 episodes of rectal bleeding at home. s/p exp lap with TAH  Clinical Impression  Patient evaluated by Physical Therapy with no further acute PT needs identified. All education has been completed and the patient has no further questions. Pt doing well, recommend pt continue to amb with nursing staff and/or family  See below for any follow-up Physical Therapy or equipment needs. PT is signing off. Thank you for this referral.     Follow Up Recommendations No PT follow up(pt/dtr do not feel pt needs HHPT)    Equipment Recommendations  None recommended by PT    Recommendations for Other Services       Precautions / Restrictions        Mobility  Bed Mobility Overal bed mobility: Modified Independent             General bed mobility comments: advised on log roll technique should pt pain incr, not needed today as pain level at zero  Transfers Overall transfer level: Needs assistance Equipment used: Rolling walker (2 wheeled) Transfers: Sit to/from Stand Sit to Stand: Supervision;Modified independent (Device/Increase time)            Ambulation/Gait Ambulation/Gait assistance: Supervision Gait Distance (Feet): 160 Feet Assistive device: Rolling walker (2 wheeled) Gait Pattern/deviations: Step-through pattern     General Gait Details: no LOB noted, stedy gait with useof RW  Stairs            Wheelchair Mobility    Modified Rankin (Stroke Patients Only)       Balance Overall balance assessment: Needs assistance   Sitting balance-Leahy Scale: Good        Standing balance-Leahy Scale: Fair Standing balance comment: at least fair                             Pertinent Vitals/Pain Pain Assessment: (reports still "numb")    Home Living Family/patient expects to be discharged to:: Private residence Living Arrangements: Alone Available Help at Discharge: Personal care attendant;Family;Available PRN/intermittently(aide 5hrs per day 6 days per wk)   Home Access: Stairs to enter Entrance Stairs-Rails: Right Entrance Stairs-Number of Steps: 2 Home Layout: One level Home Equipment: Walker - 2 wheels;Bedside commode;Shower seat;Cane - single point;Adaptive equipment      Prior Function           Comments: amb with RW at baseline in and outside of home     Hand Dominance        Extremity/Trunk Assessment   Upper Extremity Assessment Upper Extremity Assessment: Overall WFL for tasks assessed    Lower Extremity Assessment Lower Extremity Assessment: Overall WFL for tasks assessed       Communication      Cognition Arousal/Alertness: Awake/alert Behavior During Therapy: WFL for tasks assessed/performed Overall Cognitive Status: Within Functional Limits for tasks assessed                                        General Comments      Exercises  Assessment/Plan    PT Assessment All further PT needs can be met in the next venue of care  PT Problem List         PT Treatment Interventions      PT Goals (Current goals can be found in the Care Plan section)  Acute Rehab PT Goals PT Goal Formulation: All assessment and education complete, DC therapy    Frequency     Barriers to discharge        Co-evaluation               AM-PAC PT "6 Clicks" Daily Activity  Outcome Measure Difficulty turning over in bed (including adjusting bedclothes, sheets and blankets)?: None Difficulty moving from lying on back to sitting on the side of the bed? : None Difficulty sitting down on and  standing up from a chair with arms (e.g., wheelchair, bedside commode, etc,.)?: A Little Help needed moving to and from a bed to chair (including a wheelchair)?: A Little Help needed walking in hospital room?: A Little Help needed climbing 3-5 steps with a railing? : A Little 6 Click Score: 20    End of Session Equipment Utilized During Treatment: Gait belt Activity Tolerance: Patient tolerated treatment well Patient left: in chair;with call bell/phone within reach;with chair alarm set;with family/visitor present   PT Visit Diagnosis: Difficulty in walking, not elsewhere classified (R26.2)    Time: 2957-4734 PT Time Calculation (min) (ACUTE ONLY): 23 min   Charges:     PT Treatments $Gait Training: 8-22 mins   PT G CodesKenyon Ana, PT Pager: [phone] 03/21/2018   [facility] 03/21/2018, 1:53 PM

## 2018-03-21 NOTE — Progress Notes (Signed)
1 Day Post-Op Procedure(s) (LRB): EXPLORATORY LAPAROTOMY (N/A) TOTAL HYSTERECTOMY ABDOMINAL WITH BILATERAL  SALPINGO-OOPHORECTOMY (Bilateral) OMENTECTOMY (N/A) DEBULKING (N/A)  1521/1521-01  Past Medical History:  Diagnosis Date  . Anxiety   . Chronic blood loss anemia    03-04-2018 diverticular bleed and rectal bleeding,  transfused 2 units PRBCs 03-08-2018  . COPD (chronic obstructive pulmonary disease) ([place])    pulmologist-  dr byrum  . Depression   . Diastolic CHF, chronic ([place])    followed by cardiology  . Diverticulosis   . Dyspnea on effort   . Family hx of colon cancer   . Fibromyalgia   . GERD (gastroesophageal reflux disease)    03-17-2018 per pt has not had any gerd issues in long time and no meds prn  . Hemorrhoids   . Hiatal hernia   . History of lower GI bleeding 03/04/2018   admission--  dx diverticular bleed and rectal bleed in setting of xarelto-- xarelto stopped and transfused 03-08-2018 2 units PRBCs  . History of rectal polyps   . History of shingles   . Hypothyroidism   . IBS (irritable bowel syndrome)   . Lymphocytic colitis    FOLLOWED BY DR Henrene Pastor  . Malignant ascites    dx at admission 09/ 2018 abdominal s/p  parencentesis 07-01-2017 2.5L,  07-08-2016  2.7L,  07-12-2017  1475ml  . Neuropathy due to chemotherapeutic drug ([place])   . Osteoporosis   . Ovarian cancer [facility]) oncologist-  gorsuch/  dr Gerarda Fraction   chemotherapy 07-19-2017 to 11-04-2017  . Paroxysmal atrial fibrillation ([place])    CARDIOLOGIST-  DR S. MCDOWELL-  first dx'd 07/ 2014---was taking xarelto up until 03-07-2018 stopped due to lower GI bleed  . Pleural effusion    s/p  right thoracentesis, 02-2018 1.3L and 03-17-2018 right thoracentesis 653ml , post cxr no residual effusion  . Psoriatic arthritis ([place])   . Schatzki's ring    S/P  DILATERAL 2013  . Seasonal allergic rhinitis   . Wears glasses   . Wears partial dentures    LOWER RIGHT SIDE     Subjective: Patient reports Pain  controlled with meds, Foley out, no void yet. No N/V. Tolerating sips. No dizziness or CP.    Objective: Vital signs in last 24 hours: Temp:  [97.3 F (36.3 C)-98.2 F (36.8 C)] 98.2 F (36.8 C) (06/14 0450) Pulse Rate:  [72-103] 72 (06/13 2002) Resp:  [4-19] 18 (06/13 2002) BP: (93-143)/(46-89) 93/57 (06/14 0450) SpO2:  [81 %-100 %] 97 % (06/14 0450) Weight:  [166 lb 4.8 oz (75.4 kg)] 166 lb 4.8 oz (75.4 kg) (06/13 1021) Last BM Date: 03/20/18  Intake/Output from previous day: 06/13 0701 - 06/14 0700 In: 2331 [P.O.:120; I.V.:1611; IV Piggyback:600] Out: 685 [Urine:585; Blood:100] No data found.   Physical Examination: General: alert and no distress Resp: clear to auscultation bilaterally Cardio: regular rate and rhythm GI: incision: clean, dry, intact and +hyperactive bowel sounds. mild distension. nontender. no guarding or rebound Extremities: extremities normal, atraumatic, no cyanosis or edema  Labs: WBC/Hgb/Hct/Plts:  8.5/9.7/28.9/178 (06/14 4481) BUN/Cr/glu/ALT/AST/amyl/lip:  12/0.81/--/--/--/--/-- (06/14 8563)  CBC Latest Ref Rng & Units 03/21/2018 03/17/2018 03/13/2018  WBC 4.0 - 10.5 K/uL 8.5 7.2 6.3  Hemoglobin 12.0 - 15.0 g/dL 9.7(L) 10.8(L) 10.1(L)  Hematocrit 36.0 - 46.0 % 28.9(L) 32.8(L) 31.2(L)  Platelets 150 - 400 K/uL 178 203 163   BMP Latest Ref Rng & Units 03/21/2018 03/17/2018 03/13/2018  Glucose 65 - 99 mg/dL 169(H) 104(H) 96  BUN 6 -  20 mg/dL 12 14 15   Creatinine 0.44 - 1.00 mg/dL 0.81 0.93 1.00  Sodium 135 - 145 mmol/L 138 135 138  Potassium 3.5 - 5.1 mmol/L 4.8 4.1 4.3  Chloride 101 - 111 mmol/L 104 98(L) 104  CO2 22 - 32 mmol/L 29 26 26   Calcium 8.9 - 10.3 mg/dL 8.0(L) 8.6(L) 9.2      Assessment/Plan:  84 y.o. s/p Procedure(s): EXPLORATORY LAPAROTOMY TOTAL HYSTERECTOMY ABDOMINAL WITH BILATERAL  SALPINGO-OOPHORECTOMY OMENTECTOMY DEBULKING: stable  Pain:  Pain is controlled on IV and/or oral medications.  Neuro: no issues  Pulm: no  issues  Heme: stable as of POD1  CV:  H/o Afib, on tele. Off anticoag due to recent GI bleed. Monitor.   Prophylaxis: pharmacologic prophylaxis (with any of the following: enoxaparin (Lovenox) 40mg  SQ 2 hours prior to surgery then every day).  GI:    . Tolerating po: Yes    . GI stress ulcer prophylaxis includes: proton pump inhibitor per orders. . Treatment for N/V: zofran .  GU: Marland Kitchen Low urine output but looks to be >30cc/hr.  . Keep IVF at 50cc/hr for now  FEN:  . IVF 50cc/hr . Elec WNL POD1 . Diet advance  Endo: no issues  ID: no issues  Gyn: O[facility], plans for chemo once recovered.   Consults: Phys Tx. If Afib while here consult medicine or cards.  The patient is to be discharged to home with Lovenox x 28 days postop.   LOS: 1 day    Isabel Caprice 03/21/2018, 8:29 AM

## 2018-03-21 NOTE — Telephone Encounter (Signed)
Called [name] and asked if she would be able to bring [name] to have labs done and to see Dr. Alvy Bimler before their appointment with Dr. Lamonte Sakai on 03/31/18.  She said that would be fine.  Advised her we would call back to let her know the appointment time.

## 2018-03-21 NOTE — Progress Notes (Signed)
Incentive spirometry given to patient and patient instructed on use.  Patient diet NPO and complaining of heartburn.  Message left with on call MD via answering service.

## 2018-03-22 ENCOUNTER — Other Ambulatory Visit: Payer: Self-pay

## 2018-03-22 LAB — BASIC METABOLIC PANEL
Anion gap: 3 — ABNORMAL LOW (ref 5–15)
BUN: 15 mg/dL (ref 6–20)
CHLORIDE: 109 mmol/L (ref 101–111)
CO2: 30 mmol/L (ref 22–32)
CREATININE: 0.75 mg/dL (ref 0.44–1.00)
Calcium: 7.9 mg/dL — ABNORMAL LOW (ref 8.9–10.3)
GFR calc non Af Amer: >60 mL/min (ref >60)
Glucose, Bld: 126 mg/dL — ABNORMAL HIGH (ref 65–99)
POTASSIUM: 4.3 mmol/L (ref 3.5–5.1)
SODIUM: 142 mmol/L (ref 135–145)

## 2018-03-22 LAB — MAGNESIUM: MAGNESIUM: 1.7 mg/dL (ref 1.7–2.4)

## 2018-03-22 LAB — HEMOGLOBIN AND HEMATOCRIT, BLOOD
HCT: 26.6 % — ABNORMAL LOW (ref 36.0–46.0)
Hemoglobin: 8.8 g/dL — ABNORMAL LOW (ref 12.0–15.0)

## 2018-03-22 MED ORDER — DILTIAZEM HCL ER COATED BEADS 180 MG PO CP24
180.0000 mg | ORAL_CAPSULE | Freq: Every day | ORAL | Status: DC
  Administered 2018-03-23: 180 mg via ORAL
  Filled 2018-03-22: qty 1

## 2018-03-22 NOTE — Progress Notes (Signed)
2 Days Post-Op Procedure(s) (LRB): EXPLORATORY LAPAROTOMY (N/A) TOTAL HYSTERECTOMY ABDOMINAL WITH BILATERAL  SALPINGO-OOPHORECTOMY (Bilateral) OMENTECTOMY (N/A) DEBULKING (N/A)  Subjective: Patient reports  tolerating PO and + flatus.    Objective: Vital signs in last 24 hours: Temp:  [98 F (36.7 C)-98.7 F (37.1 C)] 98 F (36.7 C) (06/15 0607) Pulse Rate:  [87-101] 88 (06/15 0607) Resp:  [18-20] 18 (06/15 0607) BP: (120-134)/(49-58) 120/51 (06/15 0607) SpO2:  [95 %-97 %] 95 % (06/15 0607) Last BM Date: 03/20/18  Intake/Output from previous day: 06/14 0701 - 06/15 0700 In: 1539.2 [P.O.:480; I.V.:1059.2] Out: 1750 [Urine:1750]  Physical Examination: General: alert Resp: clear to auscultation bilaterally Cardio: regular rate and rhythm GI: soft, non-tender; bowel sounds normal; no masses,  no organomegaly Extremities: extremities normal, atraumatic, no cyanosis or edema and Homans sign is negative, no sign of DVT  Labs: WBC/Hgb/Hct/Plts:  --/8.8/26.6/-- (06/15 2025) BUN/Cr/glu/ALT/AST/amyl/lip:  15/0.75/--/--/--/--/-- (06/15 4270)   Assessment:  84 y.o. s/p Procedure(s): EXPLORATORY LAPAROTOMY TOTAL HYSTERECTOMY ABDOMINAL WITH BILATERAL  SALPINGO-OOPHORECTOMY OMENTECTOMY DEBULKING: progressing well Pain:  Pain is well-controlled on oral medications.  Heme:  Anemia/stable.  Asymptomatic  CV: H/O Afib. HR <110  GI:  Tolerating po: Yes     FEN: Urine output improved. Electrolytes in range  Prophylaxis: pharmacologic prophylaxis (with any of the following: enoxaparin (Lovenox) 56m SQ 2 hours prior to surgery then every day) and intermittent pneumatic compression boots.  Plan: Encourage ambulation Discontinue IV fluids Anticipate discharge to home tomorrow The patient is to be discharged to home.   LOS: 2 days    LLahoma Crocker6/15/2019, 8:36 AM

## 2018-03-22 NOTE — Progress Notes (Signed)
This shift RN notified BY [facility] Telemetry that pt's HR sustaining in the 150's. Went into the room pt is at rest in the bed and asymptomatic. Questioned BP meds pt normally takes and pt explained she takes Cardizem 2x daily, an a.m and p.m dose. reviewd pts chart, while here she has only been taking the p.m dose. Of 120 mg and has been missing the a.m dose of 180 mg. On call, Dr. Delsa Sale notified and order placed for a.m dose of Cardizem. Paged MD again to make aware pt is still sustaining in the 150's and what medication can be given at this time. No new orders at this time. Will continue to monitor pt.

## 2018-03-22 NOTE — Progress Notes (Signed)
Instructed patient on Lovenox administration.  Patient verbalized understanding.

## 2018-03-23 DIAGNOSIS — I4891 Unspecified atrial fibrillation: Secondary | ICD-10-CM

## 2018-03-23 DIAGNOSIS — C569 Malignant neoplasm of unspecified ovary: Secondary | ICD-10-CM

## 2018-03-23 MED ORDER — HYDROCODONE-ACETAMINOPHEN 10-325 MG PO TABS: 1.0000 | ORAL_TABLET | Freq: Four times a day (QID) | ORAL | 0 refills | Status: DC | PRN

## 2018-03-23 MED ORDER — METOPROLOL TARTRATE 5 MG/5ML IV SOLN
5.0000 mg | Freq: Once | INTRAVENOUS | Status: AC
  Administered 2018-03-23: 5 mg via INTRAVENOUS
  Filled 2018-03-23: qty 5

## 2018-03-23 MED ORDER — ENOXAPARIN SODIUM 40 MG/0.4ML ~~LOC~~ SOLN: 40.0000 mg | SUBCUTANEOUS | 0 refills | Status: DC

## 2018-03-23 MED ORDER — METOPROLOL TARTRATE 5 MG/5ML IV SOLN
2.5000 mg | INTRAVENOUS | Status: DC | PRN
  Filled 2018-03-23: qty 5

## 2018-03-23 NOTE — Discharge Instructions (Signed)
Activity: 1. Be up and out of the bed during the day.  Take a nap if needed.  You may walk up steps but be careful and use the hand rail.  Stair climbing will tire you more than you think, you may need to stop part way and rest.   2. No lifting or straining for 6 weeks.  3. No driving for 1-2 weeks.  Do Not drive if you are taking narcotic pain medicine.  4. Shower daily.  Use soap and water on your incision and pat dry; don't rub.   5. No sexual activity and nothing in the vagina for 4 weeks.  Planning for Recovery and Going Home Your Guide to Gynecologic Surgery     In-Hospital Recovery Plan Team Caring for You After Surgery In addition to the nursing staff on the unit, the gynecological surgery team will care for you. This team is led by your surgeon and includes medical students and a physician assistant or nurse practitioner. There will be a physician in the hospital 24 hours a day to tend to your needs. The students report directly to your surgeon, who is the one overseeing all of your care.  Pain Relief After Surgery Your pain will be assessed regularly on a scale from 0 to 10. Pain assessment is  necessary to guide your pain relief. It is essential that you are able to take deep breaths, cough and move. Prevention or early treatment of pain is far more effective than trying to treat severe pain. Therefore, we have devised a specialized regimen to stay ahead of your pain and use almost no narcotics, which can slow down your recovery process. If you have an epidural catheter, you will receive a  constant infusion of pain medication through your epidural. If you need additional pain relief, you will be able to push a button to increase the medication in your epidural. You will also be given acetaminophen and an ibuprofen-like medication to keep your pain under control.  You can always ask for additional pain pills if you are not comfortable. In most cases an  anesthesiologist with expertise in pain management will visit you every day and help design your pain management plan.  One Day After Surgery Focus on drinking and walking. You will start drinking clear liquids after surgery. The intravenous fluids will be stopped, and the catheter may be removed  from your bladder. We expect you to get out of bed, with the nurses' or assistants' help, sit in a chair for six hours and start to move about in the hallways. You will also meet with a case manager to assess your discharge needs, including home nursing. Your physician may order home care to assist with your transition home.  Home nursing visits, which are intermittent, help you get readjusted to home by teaching treatments, monitoring medications, and performing clinical assessment and reporting back to your physician. Other services may include therapy and medical equipment; private duty services are also available. If you are going "home" to a different address upon discharge, please alert [place]. A Home Care Coordinator can visit with you while in the hospital to discuss your options. If you have questions please speak with your case manager. If you need rehabilitation at a facility, a social worker will assist with this. If you need rehabilitation at a facility, a social worker will assist with this. If your procedure was performed in a minimally invasive fashion, you will be discharged to home if your pain  is well controlled and you are tolerating a regular diet.     Two Days After Surgery You will start eating a soft diet and change to a more solid diet as you feel up to it. The catheter from your bladder will be removed, if not already done so. If there is a dressing on your wound, it will be removed. The tubing will be disconnected from your IV. We expect you to be out of bed for the majority of the day and walking at least three times in the hallway, with assistance as needed.  You may be  discharged at this point if it is felt you are ready.   Three Days After Surgery You continue to eat your low residue diet. You may be ready to go home if you are drinking enough to keep yourself hydrated, your pain is well controlled, you are not belching or nauseated, you are passing gas and you are able to get around on your own. However, we will not discharge you from the hospital until we are sure you are ready.  Discharge Discharge time is at 10 a.m. You will need to make arrangements for someone to accompany you home. You will not be released without someone present. Please keep in mind that we strive to get patients discharged as quickly as possible, but there may be delays for a variety of reasons. Complications That May Delay Discharge: ? Nausea and vomiting: It is very common to feel sick after your surgery. We give you medication to reduce this. However, if you do feel sick, you should reduce the amount you are taking by mouth. Small, frequent meals or drinks are best in this  situation. As long as you can drink and keep yourself hydrated, the nausea will likely pass.  Ileus: Following surgery, the bowel can be sluggish, making it difficult for food and gas to pass through the intestines. This is called an ileus. We have designed our care program to do everything possible to reduce the likelihood of an ileus. If you do develop an ileus, it usually only lasts two to three days. However, it may require a small tube down the nose to decompress the stomach. The best way to avoid an ileus is to reduce the amount of narcotic pain medications, get up as much as possible after your surgery, and stimulate the bowel early after surgery with small amounts of food and liquids.  Wound infection: If a wound infection develops, this usually happens three to ten days after surgery.    Urinary retention: This is if you are unable to urinate after the catheter from your bladder is removed.  The catheter may need to be reinserted until you are able to urinate on your own. This can be caused by anesthesia, pain medication and decreased activity.    When you are preparing to go home, you will receive:  Detailed discharge instructions, with information about your operation and medications    All prescriptions for medications you need at home; prescriptions can be filled while you are in the hospital if you would like    You may be prescribed Lovenox. Lovenox is used to reduce the risk of developing a blood clot after surgery. An appointment to see your surgeon or provider one to two weeks after you leave the hospital for follow-up   After Discharge Once you are discharged: Call us at any time if you are worried about your recovery or if you should have any questions. During  regular office hours, (8:30 a.m.-4:30 p.m.), and after hours call [phone].  Call us immediately if:  You have a fever higher than 100.4 degrees.   Your wound is red, more painful or has drainage.    You are nauseated, vomiting or can't keep liquids down.    Your pain is worse and not able to be controlled with the regimen you were sent home with.    If you are bleeding heavily or have a lot of fluid coming from your vagina. If you are on narcotics, the goal is to wean you off of them. If you are running low on supply and need more, call the nurse a few days before you will run out.  It is generally easier to reach someone between 8:30 a.m. - 4:30 p.m., so call early if you think something is not right. A nurse or nurse practitioner is available every day to answer your questions. After hours and on the weekends, the calls go to the resident doctors in the hospital. It may take longer for your phone call to be returned during this time. If you have a true emergency, such as severe abdominal pain, chest pain, shortness of breath or any other acute issues, call 911 and go to the  local emergency room. Have them contact our team once you are stable.  Concerns After Discharge Bowel Function Following Your Surgery Your bowels will take several weeks to settle down and may be unpredictable at first. Your bowel movements may become loose, or you may be constipated. For the vast number of patients, this will get back to normal with time. Make sure you eat nutritious meals, drink plenty of fluids and take regular walks during the first two weeks after your operation. Your Guide to Gynecologic Surgery    Abdominal Pain It is not unusual to suffer gripping pains (colic) during the first week following removal of a portion of your bowel. This pain usually lasts for a few minutes but goes away between spasms. If you have severe pain lasting more than one to two hours or have a fever and feel generally unwell, you should contact us at  the telephone contact numbers listed at the end of this packet. Hysterectomy: You should have pelvic rest for six (6) weeks or as specified by your doctor after surgery. You should have nothing in the vagina (no tampons, douching, intercourse, etc.,) during this time period. If you have some vaginal spotting, this is normal. If you have heavy bleeding or  a lot of fluid from your vagina, this is NOT normal and you should contact your doctor's office or, if after hours, contact the doctor on call.  Diarrhea: Fiber and Imodium (Loperamide) The first step to improving your frequent or loose stools is to bulk up the stool with fiber. Metamucil is the most common type of fiber that is available at any drug store. Start with 1 teaspoon mixed into food, like yogurt or oatmeal, in the morning and evening. Try not to drink any fluid for one hour after you take the fiber. This will allow the fiber to act like a sponge in your intestines, soaking up all the excess water. Continue this for three to five days. You may increase by 1 teaspoon every three  to five days until the desired affect, or you are at 1 tablespoon (3 teaspoons) twice a day. If this doesn't work, you may try over-the-counter Loperamide, which is an antidiarrheal medication. You may take one tablet  in the morning and evening or 30 minutes before you typically have diarrhea. You may take up to eight of these tablets daily. It is best to discuss this with [place] prior to using this medication. If you have continuous diarrhea and abdominal cramping, call [phone].  Foley Catheter Your surgeon may recommend you be discharged home with a foley catheter (bladder catheter) for 1 to 2 weeks. Typically this recommendation will be made for patients undergoing surgery to the lower urinary tract. Before you leave the hospital, your nurse should outfit you with a clip on the inner thigh to secure the catheter to prevent pulling as well as a small bag that can be easily worn on the upper leg under loose fitting pants and skirts. Your nurse will teach you how to exchange the large bag that typically comes with the catheter for the small bag. You may find it convenient to attach the small bag when active during the day and then the large bag when sleeping at night. If there is ever a point when you notice the catheter is not draining urine and youbegin to develop pain behind/above the pubic bone, you should report to the clinic or emergency room immediately as the catheter may be kinked or clogged. Kinking or clogging of the catheter prevents urine from draining from your bladder. Urine will quickly build up in the bladder and can cause severe pain as well as seriously disrupt healing if you have undergone surgery on the lower urinary tract. Additionally, pulling on the catheter can result in displacement of the balloon at the end of the catheter from inside of to outside  of the bladder. This also results in severe pain and can cause bleeding. For this reason, secure the catheter to the clip  on your inner thigh at all times as the clip prevents against pulling.  Wound Care For the first few weeks following surgery, your wound may be slightly red and uncomfortable. You may shower and let the soapy water wash over your incision. Avoid soaking in the tub for one month following surgery or until the wound is well healed. It will take the wound several months to "soften." It is common to have bumpy areas in the wound near the belly  button and at the ends of the incision.  If you have staples, these should be removed when you are seen by your surgeon at the follow-up appointment. You may have a glue-like material on your incision. Do not pick at this. It will come off over time. It is the surgical glue used in surgery to close your incision. You also have sutures inside of you that will dissolve over time  Post-Surgery Diet Attention to good nutrition after surgery is important to your recovery. If you had no dietary restrictions prior to the surgery, you will have no special dietary restrictions after the surgery. However, consuming enough protein, calories, vitamins and minerals is necessary to support healing. Some patients find their appetite is less than normal after surgery. In this case, frequent small meals throughout the day may help. It is not uncommon to lose 10 to 15 pounds after surgery. However, by the fourth to fifth week, your weight loss should stabilize. It is normal that certain foods taste different and certain smells may make you nauseas. Over time, the amount you can comfortably consume will gradually increase. You should try to eat a balanced diet, which includes:  Foods that are soft, moist, and easy to chew and  swallow    Canned or soft-cooked fruits and vegetables   Plenty of soft breads, rice, pasta, potatoes and other starchy foods (lowerfiber  varieties may be tolerated better initially)    High-protein foods and beverages, such as meats,  eggs, milk, cottage cheese  or a supplemental nutrition drink like Boost or Ensure    Drink plenty of fluids-at least 8 to 10 cups per day. This includes water,  fruit juice, Gatorade, teas/coffee and milk. Drinking plenty is especially important if you have loose stools (diarrhea).   Avoid drinking a lot of caffeine, since this may dehydrate you.    Avoid fried, greasy and highly seasoned or spicy foods.    Avoid carbonated beverages in the first couple weeks.    Avoid raw fruits and vegetables.   Hobbies/Activities Walking is encouraged after your surgery. You should plan to undertake regular exercise several times a day and gradually increase this during the four weeks following your operation until you are back to your normal level of activity. You may climb stairs. Don't do any heavy lifting greater than 10 pounds or contact sports for the first month after your surgery. Generally, you can return to hobbies and activities soon after your surgery. This will help you recover. It can take up to two to three months to fully recover. It is not unusual to be fatigued and require an afternoon nap for up to six to eight weeks following surgery. Your body is using this energy to heal your wounds. Set small goals for yourself and try to do a little more each day.  Work It is normal to return to work three to six weeks following your operation. If your job involves heavy manual work, then you should wait six weeks. However, you should check with your employer regarding rules, which may be relevant to your return to work. If you need a return-to-work form for your employer or disability papers, bring them to your follow-up appointment or fax them to our office at [phone].  Driving You may drive when you are off narcotics and pain-free enough to react quickly with your braking foot. For most patients, this occurs at one to four weeks following surgery.   Write down any  questions you may have to ask your care team.  Important Contact Numbers: GYN Oncology Office: [phone]  Diet: 1. Low sodium Heart Healthy Diet is recommended.  2. It is safe to use a laxative if you have difficulty moving your bowels.   Wound Care: 1. Keep clean and dry.  Shower daily.  Reasons to call the Doctor:   Fever - Oral temperature greater than 100.4 degrees Fahrenheit  Foul-smelling vaginal discharge  Difficulty urinating  Nausea and vomiting  Increased pain at the site of the incision that is unrelieved with pain medicine.  Difficulty breathing with or without chest pain  New calf pain especially if only on one side  Sudden, continuing increased vaginal bleeding with or without clots.   Follow-up: 1. See [name] in 4 weeks.  Contacts: For questions or concerns you should contact:  Dr. Precious [name] at [phone]  or at [place]

## 2018-03-23 NOTE — Progress Notes (Signed)
[name] to be D/C'd Home per MD order.  Discussed prescriptions and follow up appointments with the patient. Prescriptions given to patient, medication list explained in detail. Pt verbalized understanding.  Allergies as of 03/23/2018      Reactions   Codeine Itching      Medication List    STOP taking these medications   rivaroxaban 20 MG Tabs tablet Commonly known as:  XARELTO     TAKE these medications   albuterol 108 (90 Base) MCG/ACT inhaler Commonly known as:  PROAIR HFA Inhale 2 puffs into the lungs every 6 (six) hours as needed for wheezing or shortness of breath.   ALPRAZolam 0.25 MG tablet Commonly known as:  XANAX Take 1 tablet (0.25 mg total) by mouth at bedtime as needed for anxiety.   azelastine 0.1 % nasal spray Commonly known as:  ASTELIN Place 2 sprays into both nostrils 2 (two) times daily as needed for allergies.   Budesonide ER 9 MG Tb24 Commonly known as:  UCERIS Take 1 tablet by mouth daily.   budesonide-formoterol 160-4.5 MCG/ACT inhaler Commonly known as:  SYMBICORT Inhale 2 puffs into the lungs 2 (two) times daily.   dicyclomine 10 MG capsule Commonly known as:  BENTYL Take 1 tab by mouth every morning. May take twice daily as needed.   diltiazem 180 MG 24 hr capsule Commonly known as:  CARDIZEM CD Take 1 capsule (180 mg total) by mouth every morning.   diltiazem 120 MG 24 hr capsule Commonly known as:  CARDIZEM CD Take 1 capsule (120 mg total) by mouth every evening.   diphenhydrAMINE 25 mg capsule Commonly known as:  BENADRYL Take 25 mg by mouth every 6 (six) hours as needed for itching.   diphenoxylate-atropine 2.5-0.025 MG tablet Commonly known as:  LOMOTIL Take 1 tablet by mouth 4 (four) times daily. What changed:    when to take this  reasons to take this   DULoxetine 30 MG capsule Commonly known as:  CYMBALTA TAKE 1 CAPSULE(30 MG) BY MOUTH DAILY What changed:  See the new instructions.   enoxaparin 40 MG/0.4ML  injection Commonly known as:  LOVENOX Inject 0.4 mLs (40 mg total) into the skin daily. Start taking on:  03/24/2018   fluticasone 50 MCG/ACT nasal spray Commonly known as:  FLONASE Place 2 sprays into both nostrils 2 (two) times daily as needed (FOR NASAL CONGESTION.).   furosemide 20 MG tablet Commonly known as:  LASIX Take 1 tablet (20 mg total) by mouth daily.   HYDROcodone-acetaminophen 10-325 MG tablet Commonly known as:  NORCO Take 1 tablet by mouth every 4 (four) hours as needed. What changed:  reasons to take this   HYDROcodone-acetaminophen 10-325 MG tablet Commonly known as:  NORCO Take 1 tablet by mouth every 6 (six) hours as needed. What changed:  You were already taking a medication with the same name, and this prescription was added. Make sure you understand how and when to take each.   ipratropium-albuterol 0.5-2.5 (3) MG/3ML Soln Commonly known as:  DUONEB Take 3 mLs by nebulization 3 (three) times daily. What changed:    when to take this  reasons to take this  additional instructions   levothyroxine 137 MCG tablet Commonly known as:  SYNTHROID, LEVOTHROID Take 137 mcg by mouth daily before breakfast. For thyroid therapy   lidocaine-prilocaine cream Commonly known as:  EMLA Apply 1 application topically as needed. What changed:  reasons to take this   mirtazapine 15 MG tablet Commonly  known as:  REMERON TAKE 1 TABLET(15 MG) BY MOUTH AT BEDTIME   morphine 15 MG 12 hr tablet Commonly known as:  MS CONTIN Take 1 tablet (15 mg total) by mouth every 12 (twelve) hours. What changed:    when to take this  reasons to take this   ondansetron 8 MG tablet Commonly known as:  ZOFRAN Take 8 mg by mouth every 8 (eight) hours as needed for nausea or vomiting.   polyvinyl alcohol 1.4 % ophthalmic solution Commonly known as:  LIQUIFILM TEARS 1 drop 2 (two) times daily as needed for dry eyes.   potassium chloride 10 MEQ tablet Commonly known as:   K-DUR Take 10 mEq by mouth daily.   Tiotropium Bromide-Olodaterol 2.5-2.5 MCG/ACT Aers Commonly known as:  STIOLTO RESPIMAT Inhale 2 puffs into the lungs daily.   Vitamin D 2000 units Caps Take 2,000 Units by mouth daily.            Discharge Care Instructions  (From admission, onward)        Start     Ordered   03/23/18 0000  Discharge wound care:    Comments:  Keep clean and dry   03/23/18 0927      Vitals:   03/22/18 1959 03/23/18 0028  BP: (!) 143/63 125/67  Pulse: (!) 102 99  Resp: 20   Temp: 98.6 F (37 C)   SpO2: 95%     Skin clean, dry and intact without evidence of skin break down, no evidence of skin tears noted. IV catheter discontinued intact. Site without signs and symptoms of complications. Dressing and pressure applied. Pt denies pain at this time. No complaints noted.  An After Visit Summary was printed and given to the patient. Patient escorted via [place], and D/C home via private auto.  Haywood Lasso BSN, RN [employer] Phone [phone]

## 2018-03-23 NOTE — Progress Notes (Signed)
[facility]  Patient evaluated this AM. We were consulted for A-flutter. After receiving IV Lopressor, she converted to NSR. She remains in NSR and is on her home dose of Cardizem. Anticoagulation being held due to rectal  bleeding and subsequent surgery. She is stable today and will be discharged by her primary team.   Debbe Odea, MD

## 2018-03-23 NOTE — Consult Note (Signed)
Medical Consultation   [name]  QMG:[id number]  DOB: Oct 20, 1932  DOA: 03/20/2018  PCP: Asencion Noble, MD    Requesting physician: Dr. Delsa Sale  Reason for consultation: A.Flutter RVR   History of Present Illness: [name] is an 84 y.o. female with h/o ovarian cancer, COPD, chronic diastolic CHF, A.Fib not on anticoagulation due to rectal bleeding last month.  Patient just underwent TAH/BSO and debulking for ovarian cancer x2 days ago.  This evening noted to be in A.Fib / flutter RVR with rate between 130 and 160 on monitor.  Dr. Delsa Sale and patient inform me that she missed her AM dose of cardizem, normally takes 180 mg 24h capsule in the AM and 174m 24h capsule in the evening.  She did get her evening dose at 7pm.  Patient is asymptomatic despite HR up to the 160s.  No CP, no SOB, no lightheadedness, etc.   Review of Systems:  ROS As per HPI otherwise 10 point review of systems negative.    Past Medical History: Past Medical History:  Diagnosis Date  . Anxiety   . Chronic blood loss anemia    03-04-2018 diverticular bleed and rectal bleeding,  transfused 2 units PRBCs 03-08-2018  . COPD (chronic obstructive pulmonary disease) ([place]    pulmologist-  dr byrum  . Depression   . Diastolic CHF, chronic ([place]    followed by cardiology  . Diverticulosis   . Dyspnea on effort   . Family hx of colon cancer   . Fibromyalgia   . GERD (gastroesophageal reflux disease)    03-17-2018 per pt has not had any gerd issues in long time and no meds prn  . Hemorrhoids   . Hiatal hernia   . History of lower GI bleeding 03/04/2018   admission--  dx diverticular bleed and rectal bleed in setting of xarelto-- xarelto stopped and transfused 03-08-2018 2 units PRBCs  . History of rectal polyps   . History of shingles   . Hypothyroidism   . IBS (irritable bowel syndrome)   . Lymphocytic colitis    FOLLOWED BY DR PHenrene Pastor . Malignant ascites    dx  at admission 09/ 2018 abdominal s/p  parencentesis 07-01-2017 2.5L,  07-08-2016  2.7L,  07-12-2017  14515m . Neuropathy due to chemotherapeutic drug ([place]  . Osteoporosis   . [facility]  [facility]/  dr phGerarda Fraction chemotherapy 07-19-2017 to 11-04-2017  . Paroxysmal atrial fibrillation ([place]   CARDIOLOGIST-  DR S. MCDOWELL-  first dx'd 07/ 2014---was taking xarelto up until 03-07-2018 stopped due to lower GI bleed  . Pleural effusion    s/p  right thoracentesis, 02-2018 1.3L and 03-17-2018 right thoracentesis 64025m post cxr no residual effusion  . Psoriatic arthritis ([place] . Schatzki's ring    S/P  DILATERAL 2013  . Seasonal allergic rhinitis   . Wears glasses   . Wears partial dentures    LOWER RIGHT SIDE    Past Surgical History: Past Surgical History:  Procedure Laterality Date  . CARDIOVASCULAR STRESS TEST  09/23/2012   Low risk lexiscan nuclear study w/ apical thinning but no evidence of ischemia/  normal LV function and wall motion , ef 75%  . CATARACT EXTRACTION W/ INTRAOCULAR LENS  IMPLANT, BILATERAL  10/2016  . COLONOSCOPY    . DEBULKING N/A 03/20/2018   Procedure: DEBULKING;  Surgeon: PhePrecious Haws  B, MD;  Location: [facility];  Service: Gynecology;  Laterality: N/A;  . EXAM UNDER ANESTHESIA WITH MANIPULATION OF KNEE Left 12-20-2003  dr Noemi Chapel   post TKA  . FEMUR IM NAIL Left 12/11/2013   Procedure: INTRAMEDULLARY (IM) NAIL FEMORAL;  Surgeon: [name], MD;  Location: [facility];  Service: Orthopedics;  Laterality: Left;  . HYSTERECTOMY ABDOMINAL WITH SALPINGO-OOPHORECTOMY Bilateral 03/20/2018   Procedure: TOTAL HYSTERECTOMY ABDOMINAL WITH BILATERAL  SALPINGO-OOPHORECTOMY;  Surgeon: Isabel Caprice, MD;  Location: [facility];  Service: Gynecology;  Laterality: Bilateral;  . IR FLUORO GUIDE PORT INSERTION RIGHT  07/22/2017  . IR PARACENTESIS  07/12/2017  . IR THORACENTESIS ASP PLEURAL SPACE W/IMG GUIDE  03/17/2018  . IR US GUIDE VASC ACCESS RIGHT  07/22/2017    . KNEE ARTHROSCOPY W/ LATERAL RELEASE Left 09-03-2005   dr Noemi Chapel  [facility]   w/  Lysis Adhesions,  excision loose body's  . LAPAROSCOPIC CHOLECYSTECTOMY  12-04-2010  dr zeigler  . LAPAROTOMY N/A 03/20/2018   Procedure: EXPLORATORY LAPAROTOMY;  Surgeon: Isabel Caprice, MD;  [facility]: [facility];  Service: Gynecology;  Laterality: N/A;  . OMENTECTOMY N/A 03/20/2018   Procedure: OMENTECTOMY;  Surgeon: Isabel Caprice, MD;  Location: [facility];  Service: Gynecology;  Laterality: N/A;  . TOTAL KNEE ARTHROPLASTY Left 09-08-2003   dr Noemi Chapel  [facility]  . TOTAL KNEE REVISION  08/06/2012   Procedure: TOTAL KNEE REVISION;  Surgeon: [name], MD;  Location: [facility];  Service: Orthopedics;  Laterality: Left;  Left Total Knee Arthroplasty Revision  . TRANSTHORACIC ECHOCARDIOGRAM  08/20/2017   ef 60-65%,  grade 1 diastolic dysfunction/  trivial AR and TR  . Uterine polypectomy       Allergies:   Allergies  Allergen Reactions  . Codeine Itching     Social History:  reports that she quit smoking about 37 years ago. Her smoking use included cigarettes. She has a 20.00 pack-year smoking history. She has never used smokeless tobacco. She reports that she drinks alcohol. She reports that she does not use drugs.   Family History: Family History  Problem Relation Age of Onset  . Heart disease Father        pacemaker  . Lung cancer Father        hx of smoker  . Colon cancer Mother        dx in her mid 41's  . Colon cancer Maternal Grandfather   . Depression Sister      Physical Exam: Vitals:   03/21/18 2033 03/22/18 0607 03/22/18 1448 03/22/18 1959  BP: (!) 134/58 (!) 120/51 (!) 137/50 (!) 143/63  Pulse: (!) 101 88 98 (!) 102  Resp: 20 18 16 20   Temp: 98.7 F (37.1 C) 98 F (36.7 C) 98 F (36.7 C) 98.6 F (37 C)  TempSrc: Oral Oral Oral Oral  SpO2: 97% 95% 98% 95%  Weight:      Height:        Constitutional: Alert and awake, oriented x3, not in any acute distress. Eyes: PERLA, EOMI,  irises appear normal, anicteric sclera,  ENMT: external ears and nose appear normal            Lips appears normal, oropharynx mucosa, tongue, posterior pharynx appear normal  Neck: neck appears normal, no masses, normal ROM, no thyromegaly, no JVD  CVS: IRR, IRR, tachycardic Respiratory:  clear to auscultation bilaterally, no wheezing, rales or rhonchi. Respiratory effort normal. No accessory muscle use.  Abdomen: soft nontender, nondistended,  normal bowel sounds, no hepatosplenomegaly, no hernias  Musculoskeletal: : no cyanosis, clubbing or edema noted bilaterally Neuro: Cranial nerves II-XII intact, strength, sensation, reflexes Psych: judgement and insight appear normal, stable mood and affect, mental status Skin: no rashes or lesions or ulcers, no induration or nodules   Data reviewed:  I have personally reviewed following labs and imaging studies Labs:  CBC: Recent Labs  Lab 03/17/18 1158 03/21/18 0627 03/22/18 0552  WBC 7.2 8.5  --   HGB 10.8* 9.7* 8.8*  HCT 32.8* 28.9* 26.6*  MCV 99.1 98.6  --   PLT 203 178  --     Basic Metabolic Panel: Recent Labs  Lab 03/17/18 1158 03/21/18 0627 03/22/18 0552  NA 135 138 142  K 4.1 4.8 4.3  CL 98* 104 109  CO2 26 29 30   GLUCOSE 104* 169* 126*  BUN 14 12 15   CREATININE 0.93 0.81 0.75  CALCIUM 8.6* 8.0* 7.9*  MG  --   --  1.7   GFR Estimated Creatinine Clearance: 53.9 mL/min (by C-G formula based on SCr of 0.75 mg/dL). Liver Function Tests: Recent Labs  Lab 03/17/18 1158  AST 22  ALT 14  ALKPHOS 47  BILITOT 0.6  PROT 7.1  ALBUMIN 3.7   No results for input(s): LIPASE, AMYLASE in the last 168 hours. No results for input(s): AMMONIA in the last 168 hours. Coagulation profile No results for input(s): INR, PROTIME in the last 168 hours.  Cardiac Enzymes: No results for input(s): CKTOTAL, CKMB, CKMBINDEX, TROPONINI in the last 168 hours. BNP: Invalid input(s): POCBNP CBG: No results for input(s): GLUCAP in the  last 168 hours. D-Dimer No results for input(s): DDIMER in the last 72 hours. Hgb A1c No results for input(s): HGBA1C in the last 72 hours. Lipid Profile No results for input(s): CHOL, HDL, LDLCALC, TRIG, CHOLHDL, LDLDIRECT in the last 72 hours. Thyroid function studies No results for input(s): TSH, T4TOTAL, T3FREE, THYROIDAB in the last 72 hours.  Invalid input(s): FREET3 Anemia work up No results for input(s): VITAMINB12, FOLATE, FERRITIN, TIBC, IRON, RETICCTPCT in the last 72 hours. Urinalysis    Component Value Date/Time   COLORURINE AMBER (A) 03/17/2018 1158   APPEARANCEUR CLEAR 03/17/2018 1158   LABSPEC 1.027 03/17/2018 1158   LABSPEC 1.030 10/11/2017 1125   PHURINE 5.0 03/17/2018 1158   GLUCOSEU NEGATIVE 03/17/2018 1158   GLUCOSEU Negative 10/11/2017 1125   HGBUR SMALL (A) 03/17/2018 1158   BILIRUBINUR NEGATIVE 03/17/2018 1158   BILIRUBINUR Negative 10/11/2017 1125   KETONESUR 5 (A) 03/17/2018 1158   PROTEINUR 30 (A) 03/17/2018 1158   UROBILINOGEN 0.2 10/11/2017 1125   NITRITE NEGATIVE 03/17/2018 1158   LEUKOCYTESUR SMALL (A) 03/17/2018 1158   LEUKOCYTESUR Negative 10/11/2017 1125     Microbiology No results found for this or any previous visit (from the past 240 hour(s)).     Inpatient Medications:   Scheduled Meds: . acetaminophen  1,000 mg Oral Q12H  . budesonide  9 mg Oral Daily  . budesonide-formoterol  2 puff Inhalation BID  . chewing gum (ORBIT) sugar free  1 Stick Oral TID PC  . diltiazem  120 mg Oral QPM  . diltiazem  180 mg Oral Daily  . docusate sodium  100 mg Oral BID  . DULoxetine  30 mg Oral QHS  . enoxaparin (LOVENOX) injection  40 mg Subcutaneous Q24H  . feeding supplement (ENSURE ENLIVE)  237 mL Oral BID BM  . levothyroxine  137 mcg Oral QAC breakfast  .  metoprolol tartrate  5 mg Intravenous Once  . mirtazapine  15 mg Oral QHS  . pantoprazole  40 mg Oral Daily  . pregabalin  25 mg Oral BID  . Tiotropium Bromide-Olodaterol  2 puff  Inhalation Daily   Continuous Infusions:   Radiological Exams on Admission: No results found.  Impression/Recommendations Principal Problem:   Ovarian cancer ([place]) Active Problems:   Atrial fibrillation with RVR (HCC)  1. Ovarian CA - 1. POD #2 of TAH/BSO and debulking 2. Management per primary team 2. A.fib/flutter RVR - 1. Likely gone into RVR due to missed AMs dose of 134m Cardizem, got evening 1264mdose. 2. Gave lopressor 67m48mV x1 dose here at bedside 1. HR now 110s-120, BP 125/67, patient asymptomatic still 2. Will put in order for PRN lopressor 2.5-67mg10m q4h PRN for tonight, in case HR gets high again.  Hold for SBP < 110. 3. Call if: 1. Patient becomes symptomatic with CP, SOB, lightheadedness, etc. 2. Patient develops low blood pressure 3. Lopressor doesn't work and tachycardia persists. 4. Resume 180mg67mcardizem dose tomorrow morning.   Thank you for this consultation.  [facility] team will follow the patient with you.     GARDNER, JARED M. D.O. [facility] 03/23/2018, 12:19 AM

## 2018-03-23 NOTE — Discharge Summary (Signed)
Physician Discharge Summary  Patient ID: [name] MRN: [id number] DOB/AGE: 1933-01-09 84 y.o.  Admit date: 03/20/2018 Discharge date: 03/23/2018  Admission Diagnoses: Ovarian cancer [facility])  Discharge Diagnoses:  Principal Problem:   Ovarian cancer [facility]) Active Problems:   Atrial fibrillation with RVR [facility])   Discharged Condition: good  Hospital Course: On 03/20/2018, the patient underwent the following: Procedure(s): EXPLORATORY LAPAROTOMY TOTAL HYSTERECTOMY ABDOMINAL WITH BILATERAL  SALPINGO-OOPHORECTOMY OMENTECTOMY DEBULKING.   The postoperative course was remarkable for transient episode of atrial flutter; the Hospitalist Service was consulted.  The rapid heart rate was controlled with IV Metoprolol.  She was discharged to home on postoperative day 3 tolerating a regular diet and meeting all postoperative goals.  Consults: [facility]  Significant Diagnostic Studies: None  Treatments: surgery: see above  Discharge Exam: Blood pressure 125/67, pulse 99, temperature 98.6 F (37 C), temperature source Oral, resp. rate 20, height 5' 4"  (1.626 m), weight 166 lb 4.8 oz (75.4 kg), SpO2 95 %. General appearance: alert Resp: clear to auscultation bilaterally Cardio: regular rate and rhythm GI: soft, non-tender; bowel sounds normal; no masses,  no organomegaly Extremities: extremities normal, atraumatic, no cyanosis or edema and Homans sign is negative, no sign of DVT Incision/Wound: C/D/I  Disposition: Discharge disposition: 01-Home or Self Care       Discharge Instructions    Activity as tolerated - No restrictions   Complete by:  As directed    Call MD for:  extreme fatigue   Complete by:  As directed    Call MD for:  persistant dizziness or light-headedness   Complete by:  As directed    Call MD for:  persistant nausea and vomiting   Complete by:  As directed    Call MD for:  redness, tenderness, or signs of infection (pain, swelling, redness,  odor or green/yellow discharge around incision site)   Complete by:  As directed    Call MD for:  severe uncontrolled pain   Complete by:  As directed    Call MD for:  temperature >100.4   Complete by:  As directed    Diet - low sodium heart healthy   Complete by:  As directed    Discharge wound care:   Complete by:  As directed    Keep clean and dry   Driving Restrictions   Complete by:  As directed    No driving for 1- 2 weeks   Increase activity slowly   Complete by:  As directed    Lifting restrictions   Complete by:  As directed    No lifting > 10 lbs for 6 weeks   May shower / Bathe   Complete by:  As directed    No tub baths for 6 weeks   Sexual Activity Restrictions   Complete by:  As directed    No intercourse for 6 - 8 weeks     Allergies as of 03/23/2018      Reactions   Codeine Itching      Medication List    STOP taking these medications   rivaroxaban 20 MG Tabs tablet Commonly known as:  XARELTO     TAKE these medications   albuterol 108 (90 Base) MCG/ACT inhaler Commonly known as:  PROAIR HFA Inhale 2 puffs into the lungs every 6 (six) hours as needed for wheezing or shortness of breath.   ALPRAZolam 0.25 MG tablet Commonly known as:  XANAX Take 1 tablet (0.25 mg total) by mouth at  bedtime as needed for anxiety.   azelastine 0.1 % nasal spray Commonly known as:  ASTELIN Place 2 sprays into both nostrils 2 (two) times daily as needed for allergies.   Budesonide ER 9 MG Tb24 Commonly known as:  UCERIS Take 1 tablet by mouth daily.   budesonide-formoterol 160-4.5 MCG/ACT inhaler Commonly known as:  SYMBICORT Inhale 2 puffs into the lungs 2 (two) times daily.   dicyclomine 10 MG capsule Commonly known as:  BENTYL Take 1 tab by mouth every morning. May take twice daily as needed.   diltiazem 180 MG 24 hr capsule Commonly known as:  CARDIZEM CD Take 1 capsule (180 mg total) by mouth every morning.   diltiazem 120 MG 24 hr capsule Commonly  known as:  CARDIZEM CD Take 1 capsule (120 mg total) by mouth every evening.   diphenhydrAMINE 25 mg capsule Commonly known as:  BENADRYL Take 25 mg by mouth every 6 (six) hours as needed for itching.   diphenoxylate-atropine 2.5-0.025 MG tablet Commonly known as:  LOMOTIL Take 1 tablet by mouth 4 (four) times daily. What changed:    when to take this  reasons to take this   DULoxetine 30 MG capsule Commonly known as:  CYMBALTA TAKE 1 CAPSULE(30 MG) BY MOUTH DAILY What changed:  See the new instructions.   enoxaparin 40 MG/0.4ML injection Commonly known as:  LOVENOX Inject 0.4 mLs (40 mg total) into the skin daily. Start taking on:  03/24/2018   fluticasone 50 MCG/ACT nasal spray Commonly known as:  FLONASE Place 2 sprays into both nostrils 2 (two) times daily as needed (FOR NASAL CONGESTION.).   furosemide 20 MG tablet Commonly known as:  LASIX Take 1 tablet (20 mg total) by mouth daily.   HYDROcodone-acetaminophen 10-325 MG tablet Commonly known as:  NORCO Take 1 tablet by mouth every 4 (four) hours as needed. What changed:  reasons to take this   HYDROcodone-acetaminophen 10-325 MG tablet Commonly known as:  NORCO Take 1 tablet by mouth every 6 (six) hours as needed. What changed:  You were already taking a medication with the same name, and this prescription was added. Make sure you understand how and when to take each.   ipratropium-albuterol 0.5-2.5 (3) MG/3ML Soln Commonly known as:  DUONEB Take 3 mLs by nebulization 3 (three) times daily. What changed:    when to take this  reasons to take this  additional instructions   levothyroxine 137 MCG tablet Commonly known as:  SYNTHROID, LEVOTHROID Take 137 mcg by mouth daily before breakfast. For thyroid therapy   lidocaine-prilocaine cream Commonly known as:  EMLA Apply 1 application topically as needed. What changed:  reasons to take this   mirtazapine 15 MG tablet Commonly known as:  REMERON TAKE 1  TABLET(15 MG) BY MOUTH AT BEDTIME   morphine 15 MG 12 hr tablet Commonly known as:  MS CONTIN Take 1 tablet (15 mg total) by mouth every 12 (twelve) hours. What changed:    when to take this  reasons to take this   ondansetron 8 MG tablet Commonly known as:  ZOFRAN Take 8 mg by mouth every 8 (eight) hours as needed for nausea or vomiting.   polyvinyl alcohol 1.4 % ophthalmic solution Commonly known as:  LIQUIFILM TEARS 1 drop 2 (two) times daily as needed for dry eyes.   potassium chloride 10 MEQ tablet Commonly known as:  K-DUR Take 10 mEq by mouth daily.   Tiotropium Bromide-Olodaterol 2.5-2.5 MCG/ACT Aers Commonly known as:  STIOLTO RESPIMAT Inhale 2 puffs into the lungs daily.   Vitamin D 2000 units Caps Take 2,000 Units by mouth daily.            Discharge Care Instructions  (From admission, onward)        Start     Ordered   03/23/18 0000  Discharge wound care:    Comments:  Keep clean and dry   03/23/18 8403     Follow-up Information    Isabel Caprice, MD Follow up in 1 month(s).   Specialty:  Gynecologic Oncology Contact information: [place] [address] [phone]           Signed: Lahoma Crocker 03/23/2018, 9:40 AM

## 2018-03-23 NOTE — Progress Notes (Signed)
Dr. Alcario Drought came to assess pt and place new orders for PRN metoprolol. Pts HR decreased  In the 90's and BP WNL. Will continue to monitor.

## 2018-03-24 ENCOUNTER — Telehealth: Payer: Self-pay | Admitting: Oncology

## 2018-03-24 ENCOUNTER — Telehealth: Payer: Self-pay | Admitting: Hematology and Oncology

## 2018-03-24 NOTE — Telephone Encounter (Signed)
Confirmed appointments for 03/31/18 (labs at 9:30, Dr. Alvy Bimler 10:00) with [name].

## 2018-03-24 NOTE — Telephone Encounter (Signed)
Patient scheduled per 6/14 sch message. Pt aware of d&t.

## 2018-03-25 ENCOUNTER — Telehealth: Payer: Self-pay | Admitting: Oncology

## 2018-03-25 ENCOUNTER — Encounter: Payer: Self-pay | Admitting: Hematology and Oncology

## 2018-03-25 NOTE — Telephone Encounter (Signed)
Requested MSI, quantify ER/PR, HRD and BRCA testing on Accession: [id number] with Suanne Marker in [facility].

## 2018-03-26 ENCOUNTER — Telehealth: Payer: Self-pay | Admitting: Oncology

## 2018-03-26 NOTE — Telephone Encounter (Signed)
Called [name] and asked if they would be able to come in at 8 am on Monday 03/31/18 to see the genetic counselor instead of on 04/16/18.  She said that would be fine.  Appointment rescheduled per Roma Kayser.

## 2018-03-27 ENCOUNTER — Other Ambulatory Visit: Payer: Self-pay | Admitting: Hematology and Oncology

## 2018-03-27 DIAGNOSIS — C562 Malignant neoplasm of left ovary: Secondary | ICD-10-CM

## 2018-03-28 ENCOUNTER — Telehealth: Payer: Self-pay

## 2018-03-28 NOTE — Telephone Encounter (Signed)
Returned pt's daughter call regarding patient recently had surgery and thinks she is having some hot flashes, is this normal for an 84 year old?  We have permission to speak with [name] pt's daughter. Notified Joylene John NP- per her, "could have had some residual hormone and removing by her surgery could have caused some hot flashes but these should go away.  Make sure she is not having hot flashes / chills as in a fever."  [name] voiced understanding, denies fever but indicated will watch for that and I instructed her to check pt's temp when she is complaining of hot flash, just to make sure and if fever, call our office.  [name] voiced understanding and no other needs at this time.

## 2018-03-29 ENCOUNTER — Other Ambulatory Visit: Payer: Self-pay | Admitting: Hematology and Oncology

## 2018-03-31 ENCOUNTER — Inpatient Hospital Stay (HOSPITAL_BASED_OUTPATIENT_CLINIC_OR_DEPARTMENT_OTHER): Payer: Medicare Other | Admitting: Hematology and Oncology

## 2018-03-31 ENCOUNTER — Encounter: Payer: Self-pay | Admitting: Hematology and Oncology

## 2018-03-31 ENCOUNTER — Encounter: Payer: Self-pay | Admitting: Emergency Medicine

## 2018-03-31 ENCOUNTER — Inpatient Hospital Stay (HOSPITAL_BASED_OUTPATIENT_CLINIC_OR_DEPARTMENT_OTHER): Payer: Medicare Other | Admitting: Genetic Counselor

## 2018-03-31 ENCOUNTER — Encounter: Payer: Self-pay | Admitting: Genetic Counselor

## 2018-03-31 ENCOUNTER — Ambulatory Visit (INDEPENDENT_AMBULATORY_CARE_PROVIDER_SITE_OTHER)
Admission: RE | Admit: 2018-03-31 | Discharge: 2018-03-31 | Disposition: A | Discharge status: Home / Self Care | Payer: Medicare Other | Source: Ambulatory Visit | Attending: Emergency Medicine | Admitting: Emergency Medicine

## 2018-03-31 ENCOUNTER — Ambulatory Visit (INDEPENDENT_AMBULATORY_CARE_PROVIDER_SITE_OTHER): Payer: Medicare Other | Admitting: Emergency Medicine

## 2018-03-31 ENCOUNTER — Telehealth: Payer: Self-pay | Admitting: Hematology and Oncology

## 2018-03-31 ENCOUNTER — Inpatient Hospital Stay: Payer: Medicare Other

## 2018-03-31 VITALS — BP 128/60 | HR 88 | Temp 98.5°F | Resp 16 | Ht 64.0 in | Wt 162.1 lb

## 2018-03-31 DIAGNOSIS — C562 Malignant neoplasm of left ovary: Secondary | ICD-10-CM

## 2018-03-31 DIAGNOSIS — J90 Pleural effusion, not elsewhere classified: Secondary | ICD-10-CM

## 2018-03-31 DIAGNOSIS — J431 Panlobular emphysema: Secondary | ICD-10-CM

## 2018-03-31 DIAGNOSIS — Z9889 Other specified postprocedural states: Secondary | ICD-10-CM

## 2018-03-31 DIAGNOSIS — J449 Chronic obstructive pulmonary disease, unspecified: Secondary | ICD-10-CM | POA: Diagnosis not present

## 2018-03-31 DIAGNOSIS — I700 Atherosclerosis of aorta: Secondary | ICD-10-CM

## 2018-03-31 DIAGNOSIS — Z9221 Personal history of antineoplastic chemotherapy: Secondary | ICD-10-CM

## 2018-03-31 DIAGNOSIS — Z800 Family history of malignant neoplasm of digestive organs: Secondary | ICD-10-CM | POA: Diagnosis not present

## 2018-03-31 DIAGNOSIS — D638 Anemia in other chronic diseases classified elsewhere: Secondary | ICD-10-CM

## 2018-03-31 DIAGNOSIS — Z79899 Other long term (current) drug therapy: Secondary | ICD-10-CM

## 2018-03-31 DIAGNOSIS — C569 Malignant neoplasm of unspecified ovary: Secondary | ICD-10-CM

## 2018-03-31 DIAGNOSIS — Z90722 Acquired absence of ovaries, bilateral: Secondary | ICD-10-CM | POA: Diagnosis not present

## 2018-03-31 DIAGNOSIS — Z7183 Encounter for nonprocreative genetic counseling: Secondary | ICD-10-CM

## 2018-03-31 DIAGNOSIS — K922 Gastrointestinal hemorrhage, unspecified: Secondary | ICD-10-CM

## 2018-03-31 DIAGNOSIS — C561 Malignant neoplasm of right ovary: Secondary | ICD-10-CM

## 2018-03-31 DIAGNOSIS — Z90710 Acquired absence of both cervix and uterus: Secondary | ICD-10-CM | POA: Diagnosis not present

## 2018-03-31 LAB — CBC WITH DIFFERENTIAL/PLATELET
Basophils Absolute: 0 10*3/uL (ref 0.0–0.1)
Basophils Relative: 0 %
EOS ABS: 0.5 10*3/uL (ref 0.0–0.5)
EOS PCT: 5 %
HCT: 33.3 % — ABNORMAL LOW (ref 34.8–46.6)
Hemoglobin: 10.8 g/dL — ABNORMAL LOW (ref 11.6–15.9)
Lymphocytes Relative: 17 %
Lymphs Abs: 1.5 10*3/uL (ref 0.9–3.3)
MCH: 32.5 pg (ref 25.1–34.0)
MCHC: 32.4 g/dL (ref 31.5–36.0)
MCV: 100.3 fL (ref 79.5–101.0)
MONOS PCT: 11 %
Monocytes Absolute: 1 10*3/uL — ABNORMAL HIGH (ref 0.1–0.9)
NEUTROS PCT: 67 %
Neutro Abs: 5.8 10*3/uL (ref 1.5–6.5)
PLATELETS: 175 10*3/uL (ref 145–400)
RBC: 3.32 MIL/uL — AB (ref 3.70–5.45)
RDW: 13.9 % (ref 11.2–14.5)
WBC: 8.7 10*3/uL (ref 3.9–10.3)

## 2018-03-31 LAB — COMPREHENSIVE METABOLIC PANEL
ALT: 12 U/L (ref 0–55)
AST: 17 U/L (ref 5–34)
Albumin: 3.6 g/dL (ref 3.5–5.0)
Alkaline Phosphatase: 57 U/L (ref 40–150)
Anion gap: 9 (ref 3–11)
BUN: 13 mg/dL (ref 7–26)
CHLORIDE: 100 mmol/L (ref 98–109)
CO2: 29 mmol/L (ref 22–29)
CREATININE: 1.03 mg/dL (ref 0.60–1.10)
Calcium: 9.6 mg/dL (ref 8.4–10.4)
GFR, EST AFRICAN AMERICAN: 57 mL/min — AB (ref >60)
GFR, EST NON AFRICAN AMERICAN: 49 mL/min — AB (ref >60)
Glucose, Bld: 92 mg/dL (ref 70–140)
POTASSIUM: 4.1 mmol/L (ref 3.5–5.1)
SODIUM: 138 mmol/L (ref 136–145)
Total Bilirubin: 0.4 mg/dL (ref 0.2–1.2)
Total Protein: 7 g/dL (ref 6.4–8.3)

## 2018-03-31 LAB — IRON AND TIBC
Iron: 77 ug/dL (ref 41–142)
SATURATION RATIOS: 28 % (ref 21–57)
TIBC: 278 ug/dL (ref 236–444)
UIBC: 201 ug/dL

## 2018-03-31 LAB — SAMPLE TO BLOOD BANK

## 2018-03-31 LAB — FERRITIN: Ferritin: 261 ng/mL (ref 9–269)

## 2018-03-31 MED ORDER — MORPHINE SULFATE ER 15 MG PO TBCR: 15.0000 mg | EXTENDED_RELEASE_TABLET | Freq: Two times a day (BID) | ORAL | 0 refills | Status: DC

## 2018-03-31 MED FILL — MORPHINE SULF ER 15 MG TAB: 15 | 30 days supply | Qty: 60 | Fill #0

## 2018-03-31 NOTE — Progress Notes (Signed)
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[facility]) The patient is aware that is residual disease We have reviewed pathology report extensively She tested strongly positive for estrogen receptor which would make antiestrogen therapy potential option for her in the future I recommend repeat CT imaging a month from surgery to quantify residual disease Ca1 25 is pending Additional molecular testing and genetic testing are pending If repeat CT scan next month show minimum residual disease, I plan to prescribe antiestrogen treatment to spare her from side effects of chemotherapy However, if repeat CT imaging show significant disease burden, she would need to be started back on chemotherapy She verbalized understanding   Anemia, chronic disease She has multifactorial anemia Her blood count is satisfactory today She denies recent bleeding complications  Pleural effusion She has history of recurrent pleural effusion Examination today detected possible recurrence of left pleural effusion I recommend x-ray to be repeated She has appointment to see pulmonologist and I will defer to them for further management  COPD (chronic obstructive pulmonary disease) ([place]) She has no recent COPD exacerbation She has mild worsening shortness of breath since surgery which I suspect is due to recurrence of pleural effusion She has appointment to see pulmonologist today for further management.   Orders Placed This Encounter  Procedures  . CT ABDOMEN PELVIS W CONTRAST    Standing Status:   Future    Standing Expiration Date:    04/01/2019    Order Specific Question:   If indicated for the ordered procedure, I authorize the administration of contrast media per Radiology protocol    Answer:   Yes    Order Specific Question:   Preferred imaging location?    Answer:   [facility]    Order Specific Question:   Radiology Contrast Protocol - do NOT remove file path    Answer:   [email or web address]  . DG Chest 2 View    Standing Status:   Future    Standing Expiration Date:   05/05/2019    Order Specific Question:   Reason for exam:    Answer:   left recurrent pleural effusion, SOB    Order Specific Question:   Preferred imaging location?    Answer:   [facility]    INTERVAL HISTORY: Please see below for problem oriented charting. She returns with her daughter for further management after surgery She complained of mild abdominal bloating and shortness of breath since surgery She denies nausea Bowel habits are stable She felt that her incisions are healing well The patient denies any recent signs or symptoms of bleeding such as spontaneous epistaxis, hematuria or hematochezia. She denies recent cough, dizziness or symptoms of chest pain  SUMMARY OF ONCOLOGIC HISTORY: Oncology History   High grade serous ER 90%, PR 0%     Left ovarian epithelial cancer ([place])   02/18/2016 Tumor Marker    Patient's tumor was tested for the following markers: CA125 Results of the tumor marker test revealed 45      05/22/2016 Tumor Marker    Patient's tumor was tested for the following markers: CA125 Results of the tumor marker test revealed 53  05/22/2016 Imaging    Outside pelvic US showed 4.1 cm adnexa mass      06/24/2017 Imaging    Ct abdomen and pelvis:  1. Interim finding of moderate ascites within the abdomen and pelvis with additional finding of diffuse nodular infiltration of the omentum and anterior mesenteric fat, the appearance would be consistent with peritoneal  carcinomatosis/metastatic disease. Increasing retroperitoneal and upper abdominal adenopathy. 2. Re- demonstrated 3.8 cm cyst in the right adnexa. Enlarging soft tissue density in the left adnexa now with possible cystic component posteriorly. In light of the above findings, concern is for ovarian neoplasm. Correlation with pelvic ultrasound recommended. 3. Small right-sided pleural effusion, new since prior study 4. Stable hypodense splenic lesions since 2017.       06/25/2017 Imaging    US pelvis: 2.9 cm simple appearing cyst in the right ovary. Left ovary grossly unremarkable. Large volume ascites in the pelvis      06/30/2017 - 07/01/2017 Hospital Admission    She was admitted for evaluation of abdominal pain and ascites      07/01/2017 Pathology Results    PERITONEAL/ASCITIC FLUID(SPECIMEN 1 OF 1 COLLECTED 07/01/17): - POORLY DIFFERENTIATED CARCINOMA; SEE COMMENT Source Peritoneal/Ascitic Fluid, (specimen 1 of 1 collected 07/01/17) Gross Specimen: Received is/are 1000 cc's of brownish fluid. (BS:bs) Prepared: # Smears: 0 # Concentration Technique Slides (i.e. ThinPrep): 1 # Cell Block: 1 Additional Studies: Also received Hematology slide - [id number]. Comment The tumor cells are positive for cytokeratin 7 and Pax-8 but negative for cytokeratin 20, CDX-2, GATA-3, Napsin-A and TTF-1. Based on the immunoprofile a gynecology primary is favored      07/01/2017 Procedure    Successful ultrasound-guided diagnostic and therapeutic paracentesis yielding 2.5 liters of peritoneal fluid      07/07/2017 - 07/09/2017 Hospital Admission    She was admitted for management of malignant ascites      07/08/2017 Procedure    Successful ultrasound-guided therapeutic paracentesis yielding 2.7 liters liters of peritoneal fluid      07/12/2017 Procedure    Successful ultrasound-guided paracentesis yielding 1450 mL of peritoneal fluid      07/18/2017 - 07/24/2017 Hospital Admission    She was admitted  for expedited treatment      07/18/2017 Tumor Marker    Patient's tumor was tested for the following markers: CA125 Results of the tumor marker test revealed 1941      07/19/2017 - 02/04/2018 Chemotherapy    The patient had 6 cycles of carboplatin & Taxol for chemotherapy treatment, followed by 3 more cycles of carboplatin only       08/06/2017 Procedure    Successful ultrasound-guided therapeutic paracentesis yielding 2.6 liters of peritoneal fluid.      08/09/2017 Tumor Marker    Patient's tumor was tested for the following markers: CA125 Results of the tumor marker test revealed 1665      08/15/2017 Tumor Marker    Patient's tumor was tested for the following markers: CA125 Results of the tumor marker test revealed 937.9      08/20/2017 Imaging    ECHO: Normal LV size with EF 60-65%. Normal RV size and systolic function. No significant valvular abnormalities.      09/18/2017 Imaging    Chest Impression:  1. No evidence thoracic metastasis. 2. Interval increase and RIGHT pleural effusion.  Abdomen / Pelvis Impression:  1. Interval decrease in intraperitoneal free fluid. 2. Interval decrease in omental nodularity in the LEFT ventral peritoneal space. 3. Interval decrease in nodularity associated with  the LEFT ovary. 4. Cystic portion of the RIGHT ovary is increased mildly in size.      09/20/2017 Tumor Marker    Patient's tumor was tested for the following markers: CA125 Results of the tumor marker test revealed 347      10/14/2017 Tumor Marker    Patient's tumor was tested for the following markers: CA125 Results of the tumor marker test revealed 307.4      11/04/2017 Tumor Marker    Patient's tumor was tested for the following markers: CA125 Results of the tumor marker test revealed 262.5      11/28/2017 Imaging    1. Interval decrease in right pleural effusion with resolution of right atelectasis seen previously. 2. New small left pleural effusion, symmetric  to the right. 3. No intraperitoneal free fluid on the current study. 4. Continued further decrease in left omental disease, appearing less confluent today than on the prior study. 5. Left ovary remains normal in appearance today and the right adnexal cystic lesion is decreased in size compared to prior study. 6. 14 mm pancreatic cyst is unchanged. Continued attention on follow-up imaging recommended. 7. Aortic Atherosclerois (ICD10-170.0)      12/13/2017 Tumor Marker    Patient's tumor was tested for the following markers: CA125 Results of the tumor marker test revealed 197.7      01/03/2018 Tumor Marker    Patient's tumor was tested for the following markers: CA125 Results of the tumor marker test revealed 183.1      01/14/2018 Tumor Marker    Patient's tumor was tested for the following markers: CA125 Results of the tumor marker test revealed 177.4      02/04/2018 Tumor Marker    Patient's tumor was tested for the following markers: CA125 Results of the tumor marker test revealed 168.5      02/25/2018 Imaging    1. Omental carcinomatosis appears qualitatively stable to slightly decreased. Stable mild peritoneal thickening in the paracolic gutters. Stable right adnexal cyst. No ascites. No new or progressive metastatic disease in the abdomen or pelvis. 2. Small dependent right pleural effusion is increased. 3. Cystic pancreatic body lesion is decreased and now subcentimeter in size, suggesting a benign lesion. 4. Aortic Atherosclerosis (ICD10-I70.0).      03/03/2018 - 03/07/2018 Hospital Admission    She was hospitalized for GI bleed requiring blood transfusions. Xarelto was placed on hold      03/07/2018 PET scan    1. Persistent hazy omental interstitial nodularity but no hypermetabolism or discrete measurable nodules. No abdominal ascites. 2. No findings for metastatic disease involving the chest. 3. Moderate-sized right pleural effusion and small left pleural effusion.        03/20/2018 Pathology Results    1. Ovary and fallopian tube, right - OVARY AND FALLOPIAN TUBE INVOLVED BY SEROUS CARCINOMA. - PARATUBAL CYST. 2. Uterus +/- tubes/ovaries, neoplastic, cervix, left ovary and fallopian tube - LEFT OVARY: HIGH GRADE SEROUS CARCINOMA WITH TREATMENT EFFECT, SPANNING 2.5 CM. CARCINOMA INVOLVES OVARIAN SURFACE. SEE ONCOLOGY TABLE. - LEFT FALLOPIAN TUBE: INVOLVED BY SEROUS CARCINOMA. - UTERUS: -ENDOMETRIUM: INACTIVE ENDOMETRIUM. NO HYPERPLASIA OR MALIGNANCY. -MYOMETRIUM: UNREMARKABLE. NO MALIGNANCY. -SEROSA: INVOLVED BY SEROUS CARCINOMA. - CERVIX: ENDOCERVICAL POLYP. NO MALIGNANCY. 3. Omentum, resection for tumor - INVOLVED BY SEROUS CARCINOMA. 4. Soft tissue, biopsy, mesenteric nodule - INVOLVED BY SEROUS CARCINOMA. Microscopic Comment 2. OVARY or FALLOPIAN TUBE or PRIMARY PERITONEUM: Procedure: Total hysterectomy and bilateral salpingo-oophorectomy. Omentectomy. Mesenteric lymph node biopsy. Specimen Integrity: Intact. Tumor Site: Left ovary. Ovarian  Surface Involvement (required only if applicable): Present. Fallopian Tube Surface Involvement (required only if applicable): Present, bilateral. Tumor Size: 2.5 cm. Histologic Type: High grade serous carcinoma. Histologic Grade: High grade. Implants (required for advanced stage serous/seromucinous borderline tumors only): N/A. Other Tissue/ Organ Involvement: Bilateral fallopian tubes, right ovary, uterine serosa, omentum. Largest Extrapelvic Peritoneal Focus (required only if applicable): Microscopic, estimated 0.5 cm (omentum). Peritoneal/Ascitic Fluid: Prior Positive ([id number]). Treatment Effect (required only for high-grade serous carcinomas): Present in left ovary. CRS2. Regional Lymph Nodes: No lymph nodes submitted/identified. Pathologic Stage Classification (pTNM, AJCC 8th Edition): ypT3b, ypNX Representative Tumor Block: 1A, 1B, 28F, 86F. Comment(s): The right ovary has only surface deposits with  a large paratubal cyst. The left ovary has intraparenchymal tumor with associated treatment effect. Thus the tumor location is classified as a left ovarian primary.      03/20/2018 Surgery    Procedure(s) Performed:  1. Exploratory laparotomy with total hysterectomy and bilateral salpingo-oophorectomy 2. Infragastic Omentectomy  3. Debulking to <1cm gross residual disease   Surgeon: Mart Piggs, MD  Specimens: Uterus Cervix, Bilateral tubes / ovaries and omentum. Mesenteric nodule.  Operative Findings: Debulked to gross residual disease <1cm; however there is miliary disease in multiple locations including the majority of the abdominal peritoneum (anterior abdominal wall, bilateral gutters), diaphragm (Right>left), majority of small bowel mesentary. Normal appendix. Normal small uterus. Right ovary with a cystic lesion ~3cm, some adhesive disease of right adnexa to rectum/sigmoid. Gross omental disease, which was resected with the omentectomy. Smooth liver surface, but again, diaphragmatic disease noted.         03/20/2018 Genetic Testing    Patient has genetic testing done for ER/PR. Results revealed patient has ER: 90%, PR 0%.        REVIEW OF SYSTEMS:   Constitutional: Denies fevers, chills or abnormal weight loss Eyes: Denies blurriness of vision Ears, nose, mouth, throat, and face: Denies mucositis or sore throat Cardiovascular: Denies palpitation, chest discomfort or lower extremity swelling Gastrointestinal:  Denies nausea, heartburn or change in bowel habits Skin: Denies abnormal skin rashes Lymphatics: Denies new lymphadenopathy or easy bruising Neurological:Denies numbness, tingling or new weaknesses Behavioral/Psych: Mood is stable, no new changes  All other systems were reviewed with the patient and are negative.  I have reviewed the past medical history, past surgical history, social history and family history with the patient and they are unchanged  from previous note.  ALLERGIES:  is allergic to codeine.  MEDICATIONS:  Current Outpatient Medications  Medication Sig Dispense Refill  . albuterol (PROAIR HFA) 108 (90 Base) MCG/ACT inhaler Inhale 2 puffs into the lungs every 6 (six) hours as needed for wheezing or shortness of breath. 1 Inhaler 3  . ALPRAZolam (XANAX) 0.25 MG tablet Take 1 tablet (0.25 mg total) by mouth at bedtime as needed for anxiety. 5 tablet 0  . azelastine (ASTELIN) 0.1 % nasal spray Place 2 sprays into both nostrils 2 (two) times daily as needed for allergies.     . Budesonide ER (UCERIS) 9 MG TB24 Take 1 tablet by mouth daily. 30 tablet 3  . budesonide-formoterol (SYMBICORT) 160-4.5 MCG/ACT inhaler Inhale 2 puffs into the lungs 2 (two) times daily.    . Cholecalciferol (VITAMIN D) 2000 units CAPS Take 2,000 Units by mouth daily.    Marland Kitchen dicyclomine (BENTYL) 10 MG capsule Take 1 tab by mouth every morning. May take twice daily as needed. 60 capsule 6  . diltiazem (CARDIZEM CD) 120 MG 24 hr capsule Take  1 capsule (120 mg total) by mouth every evening. 90 capsule 3  . diltiazem (CARDIZEM CD) 180 MG 24 hr capsule Take 1 capsule (180 mg total) by mouth every morning. 90 capsule 3  . diphenhydrAMINE (BENADRYL) 25 mg capsule Take 25 mg by mouth every 6 (six) hours as needed for itching.    . diphenoxylate-atropine (LOMOTIL) 2.5-0.025 MG tablet Take 1 tablet by mouth 4 (four) times daily. (Patient taking differently: Take 1 tablet by mouth 4 (four) times daily as needed for diarrhea or loose stools. ) 360 tablet 1  . DULoxetine (CYMBALTA) 30 MG capsule TAKE 1 CAPSULE(30 MG) BY MOUTH DAILY AT NIGHT. 30 capsule 0  . enoxaparin (LOVENOX) 40 MG/0.4ML injection Inject 0.4 mLs (40 mg total) into the skin daily. 25 Syringe 0  . fluticasone (FLONASE) 50 MCG/ACT nasal spray Place 2 sprays into both nostrils 2 (two) times daily as needed (FOR NASAL CONGESTION.).     Marland Kitchen furosemide (LASIX) 20 MG tablet Take 1 tablet (20 mg total) by mouth  daily. 90 tablet 9  . HYDROcodone-acetaminophen (NORCO) 10-325 MG tablet Take 1 tablet by mouth every 6 (six) hours as needed. 20 tablet 0  . ipratropium-albuterol (DUONEB) 0.5-2.5 (3) MG/3ML SOLN Take 3 mLs by nebulization 3 (three) times daily. (Patient taking differently: Take 3 mLs by nebulization every 6 (six) hours as needed (cough or SOB). As needed) 360 mL   . levothyroxine (SYNTHROID, LEVOTHROID) 137 MCG tablet Take 137 mcg by mouth daily before breakfast. For thyroid therapy    . lidocaine-prilocaine (EMLA) cream Apply 1 application topically as needed. (Patient taking differently: Apply 1 application topically as needed (port). ) 30 g 6  . mirtazapine (REMERON) 15 MG tablet TAKE 1 TABLET(15 MG) BY MOUTH AT BEDTIME 30 tablet 0  . morphine (MS CONTIN) 15 MG 12 hr tablet Take 1 tablet (15 mg total) by mouth every 12 (twelve) hours. 60 tablet 0  . ondansetron (ZOFRAN) 8 MG tablet Take 8 mg by mouth every 8 (eight) hours as needed for nausea or vomiting.    . polyvinyl alcohol (LIQUIFILM TEARS) 1.4 % ophthalmic solution 1 drop 2 (two) times daily as needed for dry eyes.    . potassium chloride (K-DUR) 10 MEQ tablet Take 10 mEq by mouth daily.    . Tiotropium Bromide-Olodaterol (STIOLTO RESPIMAT) 2.5-2.5 MCG/ACT AERS Inhale 2 puffs into the lungs daily. 1 Inhaler 3   No current facility-administered medications for this visit.     PHYSICAL EXAMINATION: ECOG PERFORMANCE STATUS: 2 - Symptomatic, <50% confined to bed  Vitals:   03/31/18 0922  BP: 128/60  Pulse: 88  Resp: 16  Temp: 98.5 F (36.9 C)  SpO2: 95%   Filed Weights   03/31/18 0922  Weight: 162 lb 1.6 oz (73.5 kg)    GENERAL:alert, no distress and comfortable.  She is examined sitting on the wheelchair SKIN: skin color, texture, turgor are normal, no rashes or significant lesions EYES: normal, Conjunctiva are pink and non-injected, sclera clear OROPHARYNX:no exudate, no erythema and lips, buccal mucosa, and tongue normal   NECK: supple, thyroid normal size, non-tender, without nodularity LYMPH:  no palpable lymphadenopathy in the cervical, axillary or inguinal LUNGS: Mildly increased respiratory effort.  Reduced breath sound in the left lung base, worrisome for recurrence of pleural effusion HEART: regular rate & rhythm and no murmurs and no lower extremity edema ABDOMEN:abdomen soft, mildly distended  musculoskeletal:no cyanosis of digits and no clubbing  NEURO: alert & oriented x 3 with fluent  speech, no focal motor/sensory deficits  LABORATORY DATA:  I have reviewed the data as listed    Component Value Date/Time   NA 138 03/31/2018 0841   NA 135 (L) 09/24/2017 0945   K 4.1 03/31/2018 0841   K 3.7 09/24/2017 0945   CL 100 03/31/2018 0841   CO2 29 03/31/2018 0841   CO2 30 (H) 09/24/2017 0945   GLUCOSE 92 03/31/2018 0841   GLUCOSE 101 09/24/2017 0945   BUN 13 03/31/2018 0841   BUN 15.3 09/24/2017 0945   CREATININE 1.03 03/31/2018 0841   CREATININE 1.00 03/13/2018 0940   CREATININE 0.8 09/24/2017 0945   CALCIUM 9.6 03/31/2018 0841   CALCIUM 9.1 09/24/2017 0945   PROT 7.0 03/31/2018 0841   PROT 6.6 09/24/2017 0945   ALBUMIN 3.6 03/31/2018 0841   ALBUMIN 3.3 (L) 09/24/2017 0945   AST 17 03/31/2018 0841   AST 14 03/13/2018 0940   AST 16 09/24/2017 0945   ALT 12 03/31/2018 0841   ALT 7 03/13/2018 0940   ALT 12 09/24/2017 0945   ALKPHOS 57 03/31/2018 0841   ALKPHOS 52 09/24/2017 0945   BILITOT 0.4 03/31/2018 0841   BILITOT 0.3 03/13/2018 0940   BILITOT 1.13 09/24/2017 0945   GFRNONAA 49 (L) 03/31/2018 0841   GFRNONAA 51 (L) 03/13/2018 0940   GFRAA 57 (L) 03/31/2018 0841   GFRAA 59 (L) 03/13/2018 0940    No results found for: SPEP, UPEP  Lab Results  Component Value Date   WBC 8.7 03/31/2018   NEUTROABS 5.8 03/31/2018   HGB 10.8 (L) 03/31/2018   HCT 33.3 (L) 03/31/2018   MCV 100.3 03/31/2018   PLT 175 03/31/2018      Chemistry      Component Value Date/Time   NA 138  03/31/2018 0841   NA 135 (L) 09/24/2017 0945   K 4.1 03/31/2018 0841   K 3.7 09/24/2017 0945   CL 100 03/31/2018 0841   CO2 29 03/31/2018 0841   CO2 30 (H) 09/24/2017 0945   BUN 13 03/31/2018 0841   BUN 15.3 09/24/2017 0945   CREATININE 1.03 03/31/2018 0841   CREATININE 1.00 03/13/2018 0940   CREATININE 0.8 09/24/2017 0945      Component Value Date/Time   CALCIUM 9.6 03/31/2018 0841   CALCIUM 9.1 09/24/2017 0945   ALKPHOS 57 03/31/2018 0841   ALKPHOS 52 09/24/2017 0945   AST 17 03/31/2018 0841   AST 14 03/13/2018 0940   AST 16 09/24/2017 0945   ALT 12 03/31/2018 0841   ALT 7 03/13/2018 0940   ALT 12 09/24/2017 0945   BILITOT 0.4 03/31/2018 0841   BILITOT 0.3 03/13/2018 0940   BILITOT 1.13 09/24/2017 0945       RADIOGRAPHIC STUDIES: I have personally reviewed the radiological images as listed and agreed with the findings in the report. Dg Chest 1 View  Result Date: 03/17/2018 CLINICAL DATA:  Status post RIGHT thoracentesis. EXAM: CHEST  1 VIEW COMPARISON:  Chest x-ray dated 03/06/2018. FINDINGS: No appreciable pleural effusion appreciated on today's exam. No pneumothorax seen. Heart size and mediastinal contours are stable. RIGHT chest wall Port-A-Cath is stable in position with tip at the level of the mid/lower SVC. No acute or suspicious osseous finding. IMPRESSION: Status post RIGHT-sided thoracentesis. No residual pleural effusion identified. No pneumothorax seen. Electronically Signed   By: Franki Cabot M.D.   On: 03/17/2018 09:58   Dg Chest 2 View  Result Date: 03/06/2018 CLINICAL DATA:  Hypoxia EXAM: CHEST - 2  VIEW COMPARISON:  09/24/2017 FINDINGS: Porta catheter with tip at the SVC. Normal heart size. Stable mediastinal contours accounting for leftward rotation. Large lung volumes with trace bilateral pleural fluid. No consolidation or interstitial edema. No acute osseous finding. IMPRESSION: 1. No acute finding when compared to prior. 2. Chronic large lung volumes and  trace effusions. Electronically Signed   By: Monte Fantasia M.D.   On: 03/06/2018 10:07   Nm Pet Image Initial (pi) Skull Base To Thigh  Result Date: 03/07/2018 CLINICAL DATA:  Subsequent treatment strategy for metastatic ovarian cancer. EXAM: NUCLEAR MEDICINE PET SKULL BASE TO THIGH TECHNIQUE: 7.8 mCi F-18 FDG was injected intravenously. Full-ring PET imaging was performed from the skull base to thigh after the radiotracer. CT data was obtained and used for attenuation correction and anatomic localization. Fasting blood glucose: 102 mg/dl COMPARISON:  CT scan 11/28/2017 and 02/25/2018 FINDINGS: Mediastinal blood pool activity: SUV max 2.50 NECK: No hypermetabolic lymph nodes in the neck. Diffuse uptake in the thyroid gland likely due to thyroiditis. Incidental CT findings: none CHEST: Moderate-sized right pleural effusion and small left pleural effusion. No worrisome pulmonary lesions and no enlarged or hypermetabolic mediastinal or hilar lymph nodes. Incidental CT findings: none ABDOMEN/PELVIS: Stable hazy interstitial nodularity in the omentum without discrete measurable nodules or hypermetabolism. No evidence of peritoneal surface disease involving the liver. No hepatic metastatic disease. No enlarged or hypermetabolic abdominal/pelvic lymph nodes. Incidental CT findings: Advanced atherosclerotic calcifications involving the abdominal aorta. Status post cholecystectomy.  No biliary dilatation. Upper pole left renal calculus. Stable 4 cm simple appearing right adnexal cyst. No hypermetabolism in or around the lesion. SKELETON: No focal hypermetabolic activity to suggest skeletal metastasis. Incidental CT findings: Left hip hardware with moderate artifact. Diffuse osteoporosis. IMPRESSION: 1. Persistent hazy omental interstitial nodularity but no hypermetabolism or discrete measurable nodules. No abdominal ascites. 2. No findings for metastatic disease involving the chest. 3. Moderate-sized right pleural  effusion and small left pleural effusion. Electronically Signed   By: Marijo Sanes M.D.   On: 03/07/2018 15:58   Ir Thoracentesis Asp Pleural Space W/img Guide  Result Date: 03/17/2018 INDICATION: History of ovarian cancer. Recurrent right pleural effusion. Request for diagnostic and therapeutic thoracentesis. EXAM: ULTRASOUND GUIDED RIGHT THORACENTESIS MEDICATIONS: 1% Lidocaine = 10 mL COMPLICATIONS: None immediate. PROCEDURE: An ultrasound guided thoracentesis was thoroughly discussed with the patient and questions answered. The benefits, risks, alternatives and complications were also discussed. The patient understands and wishes to proceed with the procedure. Written consent was obtained. Ultrasound was performed to localize and mark an adequate pocket of fluid in the right chest. The area was then prepped and draped in the normal sterile fashion. 1% Lidocaine was used for local anesthesia. Under ultrasound guidance a 6 Fr Safe-T-Centesis catheter was introduced. Thoracentesis was performed. The catheter was removed and a dressing applied. FINDINGS: A total of approximately 640 mL of hazy gold fluid was removed. Samples were sent to the laboratory as requested by the clinical team. IMPRESSION: Successful ultrasound guided right thoracentesis yielding 640 mL of pleural fluid. No pneumothorax on post procedure chest X-ray. Read by: Gareth Eagle, PA-C Electronically Signed   By: Aletta Edouard M.D.   On: 03/17/2018 11:10    All questions were answered. The patient knows to call the clinic with any problems, questions or concerns. No barriers to learning was detected.  I spent 25 minutes counseling the patient face to face. The total time spent in the appointment was 30 minutes and more than 50% was  on counseling and review of test results  Heath Lark, MD 03/31/2018 10:27 AM

## 2018-03-31 NOTE — Progress Notes (Signed)
Subjective:    Patient ID: [name], female    DOB: [birth year], 84 y.o.   MRN: [id number] HPI ROV 11/22/16 -- This is a follow-up visit for patient with a history of COPD psoriatic arthritis on methotrexate in the past (not currently), chronic rhinitis and chronic cough. She reports that she has been dealing with a URI since about 9 days ago. She was treated with empiric doxy, also w tamiflu. Still coughing up clear to white. She has more wheeze. She had to stop symbicort, restarted it in January when her insurance changed. Remains on flonase and atrovent Ns bid she uses albuterol about once a day (more than usual).   ROV 04/01/17 -- Patient has a history of COPD, psoriatic arthritis previously on methotrexate, chronic rhinitis, chronic cough. She reports that she has been having trouble with back pain, DDD. Has been quite limited since last visit due to this. Her breathing has been fairly stable - she has been using Anoro, but believes that the Symbicort is better, so she wants to change back. She is using albuterol rarely. She is not using nasal steroid or Atrovent NS right now.       ROV 12/04/17 --84 year old woman with a history of psoriatic arthritis (previously on methotrexate), COPD, chronic rhinitis and chronic cough.   Seen her she is been undergoing treatment for ovarian cancer.  She also has a history of hypertension, A fib and newly diagnosed diastolic dysfunction (on xarelto).    She has been admitted twice with dyspnea that responded to diuresis, paracentesis and thoracentesis.  Most recently she had R thoracentesis done on 11/14/17 > atypical cells but no CA cells. Underwent Ct chest 11/28/17 that I reviewed > small B effusions, L > R. Her activity level is improving with rehab. She is being considered for abdominal sgy > TAH / BSO + omentum.   She is on symbicort.   Has duoneb available. She was on duoneb qid for a while but didn't think it was better than symbicort.        ROV 03/31/18  --follow-up visit for 84 year old woman with a history of COPD, psoriatic arthritis (previous methotrexate), chronic rhinitis and chronic cough.  She also has a history of ovarian cancer for which she is undergoing treatment, hypertension, atrial fibrillation with diastolic dysfunction.  We have followed imaging and thoracentesis cytology for pleural effusion.  She underwent a right thoracentesis  on 03/17/2018, cytology was negative for any evidence of her ovarian cancer. She underwent TAH, and abdominal resection on 6/13 - went well, she is recovering well. She is on Symbicort, rare albuterol use. She is set for a repeat CXR today.  Not on chemo right now, planning to start antihormonal therapy soon.                                                                                                                           Objective:   Physical Exam Vitals:   03/31/18  1055  BP: (!) 110/58  Pulse: 80  SpO2: 95%  Weight: 162 lb (73.5 kg)   Gen: Pleasant, overwt, in no distress,  normal affect  ENT: No lesions,  mouth clear,  oropharynx clear, no postnasal drip  Neck: No JVD, no stridor  Lungs: No use of accessory muscles, no wheezes, slightly decreased R base  Cardiovascular: RRR, heart sounds normal, no murmur or gallops, no peripheral edema  Musculoskeletal: No deformities, no cyanosis or clubbing  Neuro: alert, non focal  Skin: Warm, no lesions or rashes   Assessment & Plan:  COPD (chronic obstructive pulmonary disease) (HCC) Suspect that she would benefit from transition to a laba/lama, she is willing to do this but wants to wait until her existing Symbicort runs out.  We will try Stiolto at that time. Continue albuterol as needed  Pleural effusion In the setting of some left-sided heart disease, diastolic dysfunction but also known metastatic ovarian cancer.  Her cytology from 6/10 was negative.  If the pleural fluid re-accumulates and I believe she needs a repeat thoracentesis  for cytology which will increase our sensitivity for possible malignancy.   Chest x-ray today  Baltazar Apo, MD, PhD 03/31/2018, 11:23 AM [facility] [phone] or if no answer [phone]

## 2018-03-31 NOTE — Telephone Encounter (Signed)
Gave patient/relative avs report and appointments for July. Per relative they will get prep from [facility].

## 2018-03-31 NOTE — Assessment & Plan Note (Signed)
In the setting of some left-sided heart disease, diastolic dysfunction but also known metastatic ovarian cancer.  Her cytology from 6/10 was negative.  If the pleural fluid re-accumulates and I believe she needs a repeat thoracentesis for cytology which will increase our sensitivity for possible malignancy.   Chest x-ray today

## 2018-03-31 NOTE — Assessment & Plan Note (Signed)
She has history of recurrent pleural effusion Examination today detected possible recurrence of left pleural effusion I recommend x-ray to be repeated She has appointment to see pulmonologist and I will defer to them for further management

## 2018-03-31 NOTE — Assessment & Plan Note (Signed)
Suspect that she would benefit from transition to a laba/lama, she is willing to do this but wants to wait until her existing Symbicort runs out.  We will try Stiolto at that time. Continue albuterol as needed

## 2018-03-31 NOTE — Assessment & Plan Note (Addendum)
The patient is aware that is residual disease We have reviewed pathology report extensively She tested strongly positive for estrogen receptor which would make antiestrogen therapy potential option for her in the future I recommend repeat CT imaging a month from surgery to quantify residual disease Ca1 25 is pending Additional molecular testing and genetic testing are pending If repeat CT scan next month show minimum residual disease, I plan to prescribe antiestrogen treatment to spare her from side effects of chemotherapy However, if repeat CT imaging show significant disease burden, she would need to be started back on chemotherapy She verbalized understanding

## 2018-03-31 NOTE — Patient Instructions (Signed)
We will continue Symbicort 2 puffs twice a day until your current supply runs out.  At that point we will transition to the Stiolto 2 puffs once a day to see if you get extra benefit from this. Keep albuterol available to use 2 puffs if needed for shortness of breath, chest tightness, wheezing. Chest x-ray today as planned to assess for any reaccumulation of your right pleural effusion. If the right pleural effusion is coming back in the most likely next step will be for [place] to repeat your thoracentesis to perform cytology. Follow with T Parrett in 2 weeks, with Dr Lamonte Sakai next available.

## 2018-03-31 NOTE — Assessment & Plan Note (Signed)
She has multifactorial anemia Her blood count is satisfactory today She denies recent bleeding complications

## 2018-03-31 NOTE — Assessment & Plan Note (Signed)
She has no recent COPD exacerbation She has mild worsening shortness of breath since surgery which I suspect is due to recurrence of pleural effusion She has appointment to see pulmonologist today for further management.

## 2018-03-31 NOTE — Progress Notes (Signed)
Cardiology Office Note    Date:  04/01/2018   ID:  [name], DOB November 22, 1932, MRN [id number]  PCP:  Asencion Noble, MD  Cardiologist: Rozann Lesches, MD    Chief Complaint  Patient presents with  . Hospitalization Follow-up    History of Present Illness:    [name] is a 84 y.o. female with past medical history of chronic diastolic CHF, PAF (on Xarelto), COPD, and ovarian cancer who presents to the office today for hospital follow-up.  She was last examined by Dr. Domenic Polite on 02/19/2018 and reported that her palpitations had improved since Cardizem CD was further titrated at her previous office visit. Cardizem was further titrated to 180mg  in AM/120mg  in PM. In the interim, she was admitted in 02/2018 for episodes of rectal bleeding with an associated anemia. Xarelto was held at the time of discharge and it was recommended to hold this for at least 2 weeks.  She was again admitted from 03/20/2018 to 03/23/2018 for planned total hysterectomy with bilateral salpingo-oophorectomy. She did experience an episode of atrial flutter in the postoperative setting but converted to normal sinus rhythm with administration of IV Lopressor.  In talking with the patient today, she reports overall doing well since her recent hospitalization and denies any repeat episodes of palpitations. Reports heart rate has overall been well controlled when checked at home and this is at 81 during today's visit. Denies any recent dyspnea on exertion, orthopnea, PND, or lower extremity edema.  She was started on Lovenox (DVT prophylaxis dosing) by her surgeon at the time of hospital discharge and by review of notes was to remain on this for 28 days. She denies any evidence of melena, hematochezia, or hematuria since initiation of this. Repeat labs were obtained on 03/31/2018 and showed that her hemoglobin had improved to 10.8 which is close to her baseline.   Past Medical History:  Diagnosis Date  . Anxiety     . Chronic blood loss anemia    03-04-2018 diverticular bleed and rectal bleeding,  transfused 2 units PRBCs 03-08-2018  . COPD (chronic obstructive pulmonary disease) ([place])    pulmologist-  dr byrum  . Depression   . Diastolic CHF, chronic ([place])    followed by cardiology  . Diverticulosis   . Dyspnea on effort   . Family history of colon cancer   . Family hx of colon cancer   . Fibromyalgia   . GERD (gastroesophageal reflux disease)    03-17-2018 per pt has not had any gerd issues in long time and no meds prn  . Hemorrhoids   . Hiatal hernia   . History of lower GI bleeding 03/04/2018   admission--  dx diverticular bleed and rectal bleed in setting of xarelto-- xarelto stopped and transfused 03-08-2018 2 units PRBCs  . History of rectal polyps   . History of shingles   . Hypothyroidism   . IBS (irritable bowel syndrome)   . Lymphocytic colitis    FOLLOWED BY DR Henrene Pastor  . Malignant ascites    dx at admission 09/ 2018 abdominal s/p  parencentesis 07-01-2017 2.5L,  07-08-2016  2.7L,  07-12-2017  1427ml  . Neuropathy due to chemotherapeutic drug ([place])   . Osteoporosis   . Ovarian cancer [facility]) oncologist-  g[place]/  dr Gerarda Fraction   chemotherapy 07-19-2017 to 11-04-2017  . Paroxysmal atrial fibrillation ([place])    CARDIOLOGIST-  DR S. MCDOWELL-  first dx'd 07/ 2014---was taking xarelto up until 03-07-2018 stopped due to lower  GI bleed  . Pleural effusion    s/p  right thoracentesis, 02-2018 1.3L and 03-17-2018 right thoracentesis 653ml , post cxr no residual effusion  . Psoriatic arthritis ([place])   . Schatzki's ring    S/P  DILATERAL 2013  . Seasonal allergic rhinitis   . Wears glasses   . Wears partial dentures    LOWER RIGHT SIDE    Past Surgical History:  Procedure Laterality Date  . CARDIOVASCULAR STRESS TEST  09/23/2012   Low risk lexiscan nuclear study w/ apical thinning but no evidence of ischemia/  normal LV function and wall motion , ef 75%  . CATARACT EXTRACTION W/  INTRAOCULAR LENS  IMPLANT, BILATERAL  10/2016  . COLONOSCOPY    . DEBULKING N/A 03/20/2018   Procedure: DEBULKING;  Surgeon: Isabel Caprice, MD;  Location: [facility];  Service: Gynecology;  Laterality: N/A;  . EXAM UNDER ANESTHESIA WITH MANIPULATION OF KNEE Left 12-20-2003  dr Noemi Chapel   post TKA  . FEMUR IM NAIL Left 12/11/2013   Procedure: INTRAMEDULLARY (IM) NAIL FEMORAL;  Surgeon: [name], MD;  Location: [facility];  Service: Orthopedics;  Laterality: Left;  . HYSTERECTOMY ABDOMINAL WITH SALPINGO-OOPHORECTOMY Bilateral 03/20/2018   Procedure: TOTAL HYSTERECTOMY ABDOMINAL WITH BILATERAL  SALPINGO-OOPHORECTOMY;  Surgeon: Isabel Caprice, MD;  Location: [facility];  Service: Gynecology;  Laterality: Bilateral;  . IR FLUORO GUIDE PORT INSERTION RIGHT  07/22/2017  . IR PARACENTESIS  07/12/2017  . IR THORACENTESIS ASP PLEURAL SPACE W/IMG GUIDE  03/17/2018  . IR US GUIDE VASC ACCESS RIGHT  07/22/2017  . KNEE ARTHROSCOPY W/ LATERAL RELEASE Left 09-03-2005   dr Noemi Chapel  [facility]   w/  Lysis Adhesions,  excision loose body's  . LAPAROSCOPIC CHOLECYSTECTOMY  12-04-2010  dr zeigler  . LAPAROTOMY N/A 03/20/2018   Procedure: EXPLORATORY LAPAROTOMY;  Surgeon: Isabel Caprice, MD;  Location: [facility];  Service: Gynecology;  Laterality: N/A;  . OMENTECTOMY N/A 03/20/2018   Procedure: OMENTECTOMY;  Surgeon: Isabel Caprice, MD;  Location: [facility];  Service: Gynecology;  Laterality: N/A;  . TOTAL KNEE ARTHROPLASTY Left 09-08-2003   dr Noemi Chapel  [facility]  . TOTAL KNEE REVISION  08/06/2012   Procedure: TOTAL KNEE REVISION;  Surgeon: [name], MD;  Location: [facility];  Service: Orthopedics;  Laterality: Left;  Left Total Knee Arthroplasty Revision  . TRANSTHORACIC ECHOCARDIOGRAM  08/20/2017   ef 60-65%,  grade 1 diastolic dysfunction/  trivial AR and TR  . Uterine polypectomy      Current Medications: Outpatient Medications Prior to Visit  Medication Sig Dispense Refill  . albuterol (PROAIR HFA) 108 (90 Base) MCG/ACT  inhaler Inhale 2 puffs into the lungs every 6 (six) hours as needed for wheezing or shortness of breath. 1 Inhaler 3  . ALPRAZolam (XANAX) 0.25 MG tablet Take 1 tablet (0.25 mg total) by mouth at bedtime as needed for anxiety. 5 tablet 0  . azelastine (ASTELIN) 0.1 % nasal spray Place 2 sprays into both nostrils 2 (two) times daily as needed for allergies.     . budesonide-formoterol (SYMBICORT) 160-4.5 MCG/ACT inhaler Inhale 2 puffs into the lungs 2 (two) times daily.    . Cholecalciferol (VITAMIN D) 2000 units CAPS Take 2,000 Units by mouth daily.    Marland Kitchen dicyclomine (BENTYL) 10 MG capsule Take 1 tab by mouth every morning. May take twice daily as needed. 60 capsule 6  . diltiazem (CARDIZEM CD) 120 MG 24 hr capsule Take 1 capsule (120 mg total) by mouth  every evening. 90 capsule 3  . diltiazem (CARDIZEM CD) 180 MG 24 hr capsule Take 1 capsule (180 mg total) by mouth every morning. 90 capsule 3  . diphenhydrAMINE (BENADRYL) 25 mg capsule Take 25 mg by mouth every 6 (six) hours as needed for itching.    . diphenoxylate-atropine (LOMOTIL) 2.5-0.025 MG tablet Take 1 tablet by mouth 4 (four) times daily. (Patient taking differently: Take 1 tablet by mouth 4 (four) times daily as needed for diarrhea or loose stools. ) 360 tablet 1  . DULoxetine (CYMBALTA) 30 MG capsule TAKE 1 CAPSULE(30 MG) BY MOUTH DAILY AT NIGHT. 30 capsule 0  . enoxaparin (LOVENOX) 40 MG/0.4ML injection Inject 0.4 mLs (40 mg total) into the skin daily. 25 Syringe 0  . fluticasone (FLONASE) 50 MCG/ACT nasal spray Place 2 sprays into both nostrils 2 (two) times daily as needed (FOR NASAL CONGESTION.).     Marland Kitchen furosemide (LASIX) 20 MG tablet Take 1 tablet (20 mg total) by mouth daily. 90 tablet 9  . HYDROcodone-acetaminophen (NORCO) 10-325 MG tablet Take 1 tablet by mouth every 6 (six) hours as needed. 20 tablet 0  . ipratropium-albuterol (DUONEB) 0.5-2.5 (3) MG/3ML SOLN Take 3 mLs by nebulization 3 (three) times daily. (Patient taking  differently: Take 3 mLs by nebulization every 6 (six) hours as needed (cough or SOB). As needed) 360 mL   . levothyroxine (SYNTHROID, LEVOTHROID) 137 MCG tablet Take 137 mcg by mouth daily before breakfast. For thyroid therapy    . lidocaine-prilocaine (EMLA) cream Apply 1 application topically as needed. (Patient taking differently: Apply 1 application topically as needed (port). ) 30 g 6  . mirtazapine (REMERON) 15 MG tablet TAKE 1 TABLET(15 MG) BY MOUTH AT BEDTIME 30 tablet 0  . morphine (MS CONTIN) 15 MG 12 hr tablet Take 1 tablet (15 mg total) by mouth every 12 (twelve) hours. 60 tablet 0  . ondansetron (ZOFRAN) 8 MG tablet Take 8 mg by mouth every 8 (eight) hours as needed for nausea or vomiting.    . polyvinyl alcohol (LIQUIFILM TEARS) 1.4 % ophthalmic solution 1 drop 2 (two) times daily as needed for dry eyes.    . potassium chloride (K-DUR) 10 MEQ tablet Take 10 mEq by mouth daily.    . Tiotropium Bromide-Olodaterol (STIOLTO RESPIMAT) 2.5-2.5 MCG/ACT AERS Inhale 2 puffs into the lungs daily. 1 Inhaler 3   No facility-administered medications prior to visit.      Allergies:   Codeine   Social History   Socioeconomic History  . Marital status: Divorced    Spouse name: Not on file  . Number of children: 1  . Years of education: 82  . Highest education level: GED or equivalent  Occupational History  . Occupation: retired    Fish farm manager: RETIRED  Social Needs  . Financial resource strain: Not hard at all  . Food insecurity:    Worry: Never true    Inability: Never true  . Transportation needs:    Medical: No    Non-medical: No  Tobacco Use  . Smoking status: Former Smoker    Packs/day: 1.00    Years: 20.00    Pack years: 20.00    Types: Cigarettes    Last attempt to quit: 10/08/1980    Years since quitting: 37.5  . Smokeless tobacco: Never Used  Substance and Sexual Activity  . Alcohol use: Yes    Comment: rarely, 12-23-15 rarely  . Drug use: No  . Sexual activity: Not  Currently  Lifestyle  .  Physical activity:    Days per week: 0 days    Minutes per session: 0 min  . Stress: Not at all  Relationships  . Social connections:    Talks on phone: More than three times a week    Gets together: Three times a week    Attends religious service: More than 4 times per year    Active member of club or organization: No    Attends meetings of clubs or organizations: Never    Relationship status: Divorced  Other Topics Concern  . Not on file  Social History Narrative   Patient lives at home by herself.    Patient is retired.    Patient has 12 th grade education.            Family History:  The patient's family history includes Colon cancer in her maternal grandfather; Colon cancer (age of onset: 44) in her mother; Depression in her sister; Heart disease in her father; Leukemia in her maternal aunt; Lung cancer in her father.   Review of Systems:   Please see the history of present illness.     General:  No chills, fever, night sweats or weight changes. Positive for generalized weakness (improving). Cardiovascular:  No chest pain, dyspnea on exertion, edema, orthopnea, palpitations, paroxysmal nocturnal dyspnea. Dermatological: No rash, lesions/masses Respiratory: No cough, dyspnea Urologic: No hematuria, dysuria Abdominal:   No nausea, vomiting, diarrhea, bright red blood per rectum, melena, or hematemesis Neurologic:  No visual changes, wkns, changes in mental status. All other systems reviewed and are otherwise negative except as noted above.   Physical Exam:    VS:  BP 118/64 (BP Location: Left Arm)   Pulse 81   Ht 5\' 4"  (1.626 m)   Wt 164 lb (74.4 kg)   SpO2 97%   BMI 28.15 kg/m    General: Well developed, elderly Caucasian female appearing in no acute distress. Head: Normocephalic, atraumatic, sclera non-icteric, no xanthomas, nares are without discharge.  Neck: No carotid bruits. JVD not elevated.  Lungs: Respirations regular and  unlabored, without wheezes or rales.  Heart: Regular rate and rhythm. No S3 or S4.  No murmur, no rubs, or gallops appreciated. Abdomen: Soft, non-tender, non-distended with normoactive bowel sounds. No hepatomegaly. No rebound/guarding. No obvious abdominal masses. Msk:  Strength and tone appear normal for age. No joint deformities or effusions. Extremities: No clubbing or cyanosis. No lower extremity edema.  Distal pedal pulses are 2+ bilaterally. Neuro: Alert and oriented X 3. Moves all extremities spontaneously. No focal deficits noted. Psych:  Responds to questions appropriately with a normal affect. Skin: No rashes or lesions noted  Wt Readings from Last 3 Encounters:  04/01/18 164 lb (74.4 kg)  03/31/18 162 lb (73.5 kg)  03/31/18 162 lb 1.6 oz (73.5 kg)     Studies/Labs Reviewed:   EKG:  EKG is not ordered today.    Recent Labs: 08/12/2017: NT-Pro BNP 837 11/10/2017: TSH 0.388 11/12/2017: B Natriuretic Peptide 76.7 03/22/2018: Magnesium 1.7 03/31/2018: ALT 12; BUN 13; Creatinine, Ser 1.03; Hemoglobin 10.8; Platelets 175; Potassium 4.1; Sodium 138   Lipid Panel    Component Value Date/Time   CHOL 147 11/14/2017 1413    Additional studies/ records that were reviewed today include:   Echocardiogram: 08/2017 Study Conclusions  - Left ventricle: The cavity size was normal. Wall thickness was   normal. Systolic function was normal. The estimated ejection   fraction was in the range of 60% to 65%. Wall motion  was normal;   there were no regional wall motion abnormalities. Doppler   parameters are consistent with abnormal left ventricular   relaxation (grade 1 diastolic dysfunction). - Aortic valve: There was no stenosis. There was trivial   regurgitation. - Mitral valve: Mildly calcified annulus. There was no significant   regurgitation. - Right ventricle: The cavity size was normal. Systolic function   was normal. - Tricuspid valve: Peak RV-RA gradient (S): 27 mm Hg. -  Pulmonary arteries: PA peak pressure: 30 mm Hg (S). - Inferior vena cava: The vessel was normal in size. The   respirophasic diameter changes were in the normal range (= 50%),   consistent with normal central venous pressure.  Impressions:  - Normal LV size with EF 60-65%. Normal RV size and systolic   function. No significant valvular abnormalities.  Assessment:    1. Paroxysmal atrial fibrillation (H[facility])   2. Chronic diastolic heart failure ([place])   3. Essential hypertension   4. Malignant neoplasm of ovary, unspecified laterality ([place])      Plan:   In order of problems listed above:  1. Paroxysmal Atrial Fibrillation - She did experience an episode of atrial fibrillation during her recent hospitalization but reports her AM dosing of Cardizem had been held. Converted back to normal sinus rhythm with administration of IV Lopressor. Denies any recent palpitations and heart rate remains well controlled. Will continue Cardizem CD 180mg  in AM and 120mg  in PM.  - This patients CHA2DS2-VASc Score and unadjusted Ischemic Stroke Rate (% per year) is equal to 3.2 % stroke rate/year from a score of 3 (Age (4), Female). She had previously been on Xarelto but this was discontinued at the time of her recent diverticular bleed. She denies any repeat episodes of melena or hematochezia since. She has been on Lovenox injections for DVT prophylaxis since her recent surgery. We spent a significant amount of time reviewing the risks and benefits of anticoagulation and at this time she is leaning toward resuming this but would prefer to be on an alternative to Xarelto. We discussed possible initiation of Eliquis and she will discuss this further with her family members and her surgeon at tomorrow's visit (to gather their input on restarting full-dose anticoagulation versus continuing with her current course of DVT prophylaxis Lovenox injections for the next 3 weeks then restarting afterwards to reduce the risk  of bleeding). She will call us with her decision within the coming weeks. If wanting to start Eliquis, she would be on 5 mg twice daily dosing and would need a repeat CBC within 2 to 3 weeks following initiation of this.  2. Chronic Diastolic CHF - She denies any recent dyspnea on exertion, orthopnea, PND, or lower extremity edema. Recent CXR on 03/31/2018 did show evidence of a small right pleural effusion and she had previously underwent a thoracentesis on 03/17/2018. Pulmonology following closely as she may require a repeat thoracentesis with cytology given her ovarian cancer.  - Remains on PO Lasix 20 mg daily. Sodium and fluid restriction reviewed.  3. Ovarian Cancer - being followed by Dr. Alvy Bimler. Recently underwent total hysterectomy with bilateral salpingo-oophorectomy by Dr. Gerarda Fraction. Will forward today's message to her for review as the patient is scheduled for her post-operative visit tomorrow.    Medication Adjustments/Labs and Tests Ordered: Current medicines are reviewed at length with the patient today.  Concerns regarding medicines are outlined above.  Medication changes, Labs and Tests ordered today are listed in the Patient Instructions below. Patient Instructions  Medication Instructions:  Your physician recommends that you continue on your current medications as directed. Please refer to the Current Medication list given to you today.  Labwork: NONE  Testing/Procedures: NONE  Follow-Up: Your physician recommends that you schedule a follow-up appointment in: 2 [place]  Any Other Special Instructions Will Be Listed Below (If Applicable).  If you need a refill on your cardiac medications before your next appointment, please call your pharmacy.    Signed, Erma Heritage, PA-C  04/01/2018 5:39 PM    [facility] [address] [place], [place] [address] Phone: [phone]

## 2018-03-31 NOTE — Progress Notes (Signed)
[facility]      Initial Visit   Patient Name: [name] Patient DOB: [birth year] Patient Age: 84 y.o. Encounter Date: 03/31/2018  Referring Provider: Heath Lark, MD  Primary Care Provider: Asencion Noble, MD  Reason for Visit: Evaluate for hereditary susceptibility to cancer    Assessment and Plan:  . [name]'s history of ovarian cancer at age 89 is not suggestive of a hereditary predisposition to cancer when considering her family history. She meets NCCN criteria for genetic testing due to having ovarian cancer as this may impact treatment decisions.   . Testing is recommended to determine whether she has a pathogenic mutation that will impact her screening and risk-reduction for cancer. A negative result will be reassuring.  . [name] wished to pursue genetic testing and a blood sample will be sent to [facility] for analysis. To assess for germline mutations, [employer]'s MyRisk pane was ordered: APC, ATM, AXIN2, BARD1, BMPR1A, BRCA1, BRCA2, BRIP1, CDH1, CDK4, CDKN2A (p16INK4a and p14ARF), CHEK2, GALNT12, HOXB13, MLH1, MSH2, MSH3, MSH6, MUTYH, NBN, NTHL1, PALB2, PMS2, PTEN, RAD51C, RAD51D, RNF43, RPS20, SMAD4, STK11, TP53. Somatic testing was also requested using [facility] HRD to check the tumor for homologous recombination deficiency (HRD). A tumor with HRD phenotype is more likely to respond to PARP inhibitors. Three biomarkers associated with HRD are included: LOH (loss of heterozygosity), LST (large-scale state transitions), and TAI (telomeric allelic imbalance). Additionally, the BRCA1 and BRCA2 genes in the tumor are also sequenced.  . Results should be available in several weeks, at which point we will contact her and address implications for her as well as address genetic testing for at-risk family members, if needed.     Dr. Alvy Bimler was available for questions concerning this case. Total time spent by me in face-to-face counseling was  approximately 35 minutes.   _____________________________________________________________________   History of Present Illness: [name], a 84 y.o. female, is being seen at the [facility] due to a personal and family history of cancer. She presents to clinic today with her daughter, [name], to discuss the possibility of a hereditary predisposition to cancer and discuss whether genetic testing is warranted.  [name] was diagnosed with ovarian cancer at the age of 89. She received chemotherapy followed by surgery and is to have additional chemotherapy.   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 Interim finding of moderate ascites within the abdomen and pelvis with additional finding of diffuse nodular infiltration of the omentum and anterior mesenteric fat, the appearance would be consistent with peritoneal carcinomatosis/metastatic disease. Increasing retroperitoneal and upper abdominal adenopathy. 2. Re- demonstrated 3.8 cm cyst in the right adnexa. Enlarging soft tissue density in the left adnexa now with possible cystic component posteriorly. In light of the above findings, concern is for ovarian neoplasm. Correlation with pelvic ultrasound recommended. 3. Small right-sided pleural effusion,  new since prior study 4. Stable hypodense splenic lesions since 2017.       06/25/2017 Imaging    US pelvis: 2.9 cm simple appearing cyst in the right ovary. Left ovary grossly unremarkable. Large volume ascites in  the pelvis      06/30/2017 - 07/01/2017 Hospital Admission    She was admitted for evaluation of abdominal pain and ascites      07/01/2017 Pathology Results    PERITONEAL/ASCITIC FLUID(SPECIMEN 1 OF 1 COLLECTED 07/01/17): - POORLY DIFFERENTIATED CARCINOMA; SEE COMMENT Source Peritoneal/Ascitic Fluid, (specimen 1 of 1 collected 07/01/17) Gross Specimen: Received is/are 1000 cc's of brownish fluid. (BS:bs) Prepared: # Smears: 0 # Concentration Technique Slides (i.e. ThinPrep): 1 # Cell Block: 1 Additional Studies: Also received Hematology slide - [id number]. Comment The tumor cells are positive for cytokeratin 7 and Pax-8 but negative for cytokeratin 20, CDX-2, GATA-3, Napsin-A and TTF-1. Based on the immunoprofile a gynecology primary is favored      07/01/2017 Procedure    Successful ultrasound-guided diagnostic and therapeutic paracentesis yielding 2.5 liters of peritoneal fluid      07/07/2017 - 07/09/2017 Hospital Admission    She was admitted for management of malignant ascites      07/08/2017 Procedure    Successful ultrasound-guided therapeutic paracentesis yielding 2.7 liters liters of peritoneal fluid      07/12/2017 Procedure    Successful ultrasound-guided paracentesis yielding 1450 mL of peritoneal fluid      07/18/2017 - 07/24/2017 Hospital Admission    She was admitted for expedited treatment      07/18/2017 Tumor Marker    Patient's tumor was tested for the following markers: CA125 Results of the tumor marker test revealed 1941      07/19/2017 - 02/04/2018 Chemotherapy    The patient had 6 cycles of carboplatin & Taxol for chemotherapy treatment, followed by 3 more cycles of carboplatin only       08/06/2017 Procedure    Successful ultrasound-guided therapeutic paracentesis yielding 2.6 liters of peritoneal fluid.      08/09/2017 Tumor Marker    Patient's tumor was tested for the following markers: CA125 Results of the tumor marker test revealed 1665       08/15/2017 Tumor Marker    Patient's tumor was tested for the following markers: CA125 Results of the tumor marker test revealed 937.9      08/20/2017 Imaging    ECHO: Normal LV size with EF 60-65%. Normal RV size and systolic function. No significant valvular abnormalities.      09/18/2017 Imaging    Chest Impression:  1. No evidence thoracic metastasis. 2. Interval increase and RIGHT pleural effusion.  Abdomen / Pelvis Impression:  1. Interval decrease in intraperitoneal free fluid. 2. Interval decrease in omental nodularity in the LEFT ventral peritoneal space. 3. Interval decrease in nodularity associated with the LEFT ovary. 4. Cystic portion of the RIGHT ovary is increased mildly in size.      09/20/2017 Tumor Marker    Patient's tumor was tested for the following markers: CA125 Results of the tumor marker test revealed 347      10/14/2017 Tumor Marker    Patient's tumor was tested for the following markers: CA125 Results of the tumor marker test revealed 307.4      11/04/2017 Tumor Marker    Patient's tumor was tested for the following markers: CA125 Results of the tumor marker test revealed 262.5      11/28/2017 Imaging    1. Interval decrease in  right pleural effusion with resolution of right atelectasis seen previously. 2. New small left pleural effusion, symmetric to the right. 3. No intraperitoneal free fluid on the current study. 4. Continued further decrease in left omental disease, appearing less confluent today than on the prior study. 5. Left ovary remains normal in appearance today and the right adnexal cystic lesion is decreased in size compared to prior study. 6. 14 mm pancreatic cyst is unchanged. Continued attention on follow-up imaging recommended. 7. Aortic Atherosclerois (ICD10-170.0)      12/13/2017 Tumor Marker    Patient's tumor was tested for the following markers: CA125 Results of the tumor marker test revealed 197.7      01/03/2018 Tumor  Marker    Patient's tumor was tested for the following markers: CA125 Results of the tumor marker test revealed 183.1      01/14/2018 Tumor Marker    Patient's tumor was tested for the following markers: CA125 Results of the tumor marker test revealed 177.4      02/04/2018 Tumor Marker    Patient's tumor was tested for the following markers: CA125 Results of the tumor marker test revealed 168.5      02/25/2018 Imaging    1. Omental carcinomatosis appears qualitatively stable to slightly decreased. Stable mild peritoneal thickening in the paracolic gutters. Stable right adnexal cyst. No ascites. No new or progressive metastatic disease in the abdomen or pelvis. 2. Small dependent right pleural effusion is increased. 3. Cystic pancreatic body lesion is decreased and now subcentimeter in size, suggesting a benign lesion. 4. Aortic Atherosclerosis (ICD10-I70.0).      03/03/2018 - 03/07/2018 Hospital Admission    She was hospitalized for GI bleed requiring blood transfusions. Xarelto was placed on hold      03/07/2018 PET scan    1. Persistent hazy omental interstitial nodularity but no hypermetabolism or discrete measurable nodules. No abdominal ascites. 2. No findings for metastatic disease involving the chest. 3. Moderate-sized right pleural effusion and small left pleural effusion.       03/20/2018 Pathology Results    1. Ovary and fallopian tube, right - OVARY AND FALLOPIAN TUBE INVOLVED BY SEROUS CARCINOMA. - PARATUBAL CYST. 2. Uterus +/- tubes/ovaries, neoplastic, cervix, left ovary and fallopian tube - LEFT OVARY: HIGH GRADE SEROUS CARCINOMA WITH TREATMENT EFFECT, SPANNING 2.5 CM. CARCINOMA INVOLVES OVARIAN SURFACE. SEE ONCOLOGY TABLE. - LEFT FALLOPIAN TUBE: INVOLVED BY SEROUS CARCINOMA. - UTERUS: -ENDOMETRIUM: INACTIVE ENDOMETRIUM. NO HYPERPLASIA OR MALIGNANCY. -MYOMETRIUM: UNREMARKABLE. NO MALIGNANCY. -SEROSA: INVOLVED BY SEROUS CARCINOMA. - CERVIX: ENDOCERVICAL POLYP. NO  MALIGNANCY. 3. Omentum, resection for tumor - INVOLVED BY SEROUS CARCINOMA. 4. Soft tissue, biopsy, mesenteric nodule - INVOLVED BY SEROUS CARCINOMA. Microscopic Comment 2. OVARY or FALLOPIAN TUBE or PRIMARY PERITONEUM: Procedure: Total hysterectomy and bilateral salpingo-oophorectomy. Omentectomy. Mesenteric lymph node biopsy. Specimen Integrity: Intact. Tumor Site: Left ovary. Ovarian Surface Involvement (required only if applicable): Present. Fallopian Tube Surface Involvement (required only if applicable): Present, bilateral. Tumor Size: 2.5 cm. Histologic Type: High grade serous carcinoma. Histologic Grade: High grade. Implants (required for advanced stage serous/seromucinous borderline tumors only): N/A. Other Tissue/ Organ Involvement: Bilateral fallopian tubes, right ovary, uterine serosa, omentum. Largest Extrapelvic Peritoneal Focus (required only if applicable): Microscopic, estimated 0.5 cm (omentum). Peritoneal/Ascitic Fluid: Prior Positive ([id number]). Treatment Effect (required only for high-grade serous carcinomas): Present in left ovary. CRS2. Regional Lymph Nodes: No lymph nodes submitted/identified. Pathologic Stage Classification (pTNM, AJCC 8th Edition): ypT3b, ypNX Representative Tumor Block: 1A, 1B, 69F, 47F. Comment(s): The right ovary has only  surface deposits with a large paratubal cyst. The left ovary has intraparenchymal tumor with associated treatment effect. Thus the tumor location is classified as a left ovarian primary.      03/20/2018 Surgery    Procedure(s) Performed:  1. Exploratory laparotomy with total hysterectomy and bilateral salpingo-oophorectomy 2. Infragastic Omentectomy  3. Debulking to <1cm gross residual disease   Surgeon: Mart Piggs, MD  Specimens: Uterus Cervix, Bilateral tubes / ovaries and omentum. Mesenteric nodule.  Operative Findings: Debulked to gross residual disease <1cm; however there is miliary disease in  multiple locations including the majority of the abdominal peritoneum (anterior abdominal wall, bilateral gutters), diaphragm (Right>left), majority of small bowel mesentary. Normal appendix. Normal small uterus. Right ovary with a cystic lesion ~3cm, some adhesive disease of right adnexa to rectum/sigmoid. Gross omental disease, which was resected with the omentectomy. Smooth liver surface, but again, diaphragmatic disease noted.          Genetic Testing    Patient has genetic testing done for ER/PR. Results revealed patient has ER: 90%, PR 0%.        Past Medical History:  Diagnosis Date  . Anxiety   . Chronic blood loss anemia    03-04-2018 diverticular bleed and rectal bleeding,  transfused 2 units PRBCs 03-08-2018  . COPD (chronic obstructive pulmonary disease) ([place])    pulmologist-  dr byrum  . Depression   . Diastolic CHF, chronic ([place])    followed by cardiology  . Diverticulosis   . Dyspnea on effort   . Family history of colon cancer   . Family hx of colon cancer   . Fibromyalgia   . GERD (gastroesophageal reflux disease)    03-17-2018 per pt has not had any gerd issues in long time and no meds prn  . Hemorrhoids   . Hiatal hernia   . History of lower GI bleeding 03/04/2018   admission--  dx diverticular bleed and rectal bleed in setting of xarelto-- xarelto stopped and transfused 03-08-2018 2 units PRBCs  . History of rectal polyps   . History of shingles   . Hypothyroidism   . IBS (irritable bowel syndrome)   . Lymphocytic colitis    FOLLOWED BY DR Henrene Pastor  . Malignant ascites    dx at admission 09/ 2018 abdominal s/p  parencentesis 07-01-2017 2.5L,  07-08-2016  2.7L,  07-12-2017  1455m  . Neuropathy due to chemotherapeutic drug ([place]   . Osteoporosis   . Ovarian cancer ([facility] oncologist-  gorsuch/  dr pGerarda Fraction  chemotherapy 07-19-2017 to 11-04-2017  . Paroxysmal atrial fibrillation ([place]    CARDIOLOGIST-  DR S. MCDOWELL-  first dx'd 07/ 2014---was taking  xarelto up until 03-07-2018 stopped due to lower GI bleed  . Pleural effusion    s/p  right thoracentesis, 02-2018 1.3L and 03-17-2018 right thoracentesis 6450m, post cxr no residual effusion  . Psoriatic arthritis ([place]  . Schatzki's ring    S/P  DILATERAL 2013  . Seasonal allergic rhinitis   . Wears glasses   . Wears partial dentures    LOWER RIGHT SIDE    Past Surgical History:  Procedure Laterality Date  . CARDIOVASCULAR STRESS TEST  09/23/2012   Low risk lexiscan nuclear study w/ apical thinning but no evidence of ischemia/  normal LV function and wall motion , ef 75%  . CATARACT EXTRACTION W/ INTRAOCULAR LENS  IMPLANT, BILATERAL  10/2016  . COLONOSCOPY    . DEBULKING N/A 03/20/2018   Procedure: DEBULKING;  Surgeon: Isabel Caprice, MD;  Location: [facility];  Service: Gynecology;  Laterality: N/A;  . EXAM UNDER ANESTHESIA WITH MANIPULATION OF KNEE Left 12-20-2003  dr Noemi Chapel   post TKA  . FEMUR IM NAIL Left 12/11/2013   Procedure: INTRAMEDULLARY (IM) NAIL FEMORAL;  Surgeon: [name], MD;  Location: [facility];  Service: Orthopedics;  Laterality: Left;  . HYSTERECTOMY ABDOMINAL WITH SALPINGO-OOPHORECTOMY Bilateral 03/20/2018   Procedure: TOTAL HYSTERECTOMY ABDOMINAL WITH BILATERAL  SALPINGO-OOPHORECTOMY;  Surgeon: Isabel Caprice, MD;  Location: [facility];  Service: Gynecology;  Laterality: Bilateral;  . IR FLUORO GUIDE PORT INSERTION RIGHT  07/22/2017  . IR PARACENTESIS  07/12/2017  . IR THORACENTESIS ASP PLEURAL SPACE W/IMG GUIDE  03/17/2018  . IR US GUIDE VASC ACCESS RIGHT  07/22/2017  . KNEE ARTHROSCOPY W/ LATERAL RELEASE Left 09-03-2005   dr Noemi Chapel  [facility]   w/  Lysis Adhesions,  excision loose body's  . LAPAROSCOPIC CHOLECYSTECTOMY  12-04-2010  dr zeigler  . LAPAROTOMY N/A 03/20/2018   Procedure: EXPLORATORY LAPAROTOMY;  Surgeon: Isabel Caprice, MD;  Location: [facility];  Service: Gynecology;  Laterality: N/A;  . OMENTECTOMY N/A 03/20/2018   Procedure: OMENTECTOMY;  Surgeon:  Isabel Caprice, MD;  Location: [facility];  Service: Gynecology;  Laterality: N/A;  . TOTAL KNEE ARTHROPLASTY Left 09-08-2003   dr Noemi Chapel  [facility]  . TOTAL KNEE REVISION  08/06/2012   Procedure: TOTAL KNEE REVISION;  Surgeon: [name], MD;  Location: [facility];  Service: Orthopedics;  Laterality: Left;  Left Total Knee Arthroplasty Revision  . TRANSTHORACIC ECHOCARDIOGRAM  08/20/2017   ef 60-65%,  grade 1 diastolic dysfunction/  trivial AR and TR  . Uterine polypectomy      Social History   Socioeconomic History  . Marital status: Divorced    Spouse name: Not on file  . Number of children: 1  . Years of education: 75  . Highest education level: GED or equivalent  Occupational History  . Occupation: retired    Fish farm manager: RETIRED  Social Needs  . Financial resource strain: Not hard at all  . Food insecurity:    Worry: Never true    Inability: Never true  . Transportation needs:    Medical: No    Non-medical: No  Tobacco Use  . Smoking status: Former Smoker    Packs/day: 1.00    Years: 20.00    Pack years: 20.00    Types: Cigarettes    Last attempt to quit: 10/08/1980    Years since quitting: 37.5  . Smokeless tobacco: Never Used  Substance and Sexual Activity  . Alcohol use: Yes    Comment: rarely, 12-23-15 rarely  . Drug use: No  . Sexual activity: Not Currently  Lifestyle  . Physical activity:    Days per week: 0 days    Minutes per session: 0 min  . Stress: Not at all  Relationships  . Social connections:    Talks on phone: More than three times a week    Gets together: Three times a week    Attends religious service: More than 4 times per year    Active member of club or organization: No    Attends meetings of clubs or organizations: Never    Relationship status: Divorced  Other Topics Concern  . Not on file  Social History Narrative   Patient lives at home by herself.    Patient is retired.    Patient has 12 th grade education.  Family History:   During the visit, a 4-generation pedigree was obtained. Family tree will be scanned in the Media tab in Epic  Significant diagnoses include the following:  Family History  Problem Relation Age of Onset  . Heart disease Father        pacemaker  . Lung cancer Father        smoker; deceased 95  . Colon cancer Mother 57       2nd rectal ca at 23  . Colon cancer Maternal Grandfather        dx 66s; deceased 25s  . Depression Sister   . Leukemia Maternal Aunt        deceased 67    Additionally, [name] has one daughter (age 27). She has 2 sisters (ages 60 and 50) and a brother (age 4). Another brother died at 46 of an MI. Her mother had a brother and sister. Her father had 3 brothers and 5 sisters.  [name]'s ancestry is Caucasian - NOS. There is no known Jewish ancestry and no consanguinity.  Discussion: We reviewed the characteristics, features and inheritance patterns of hereditary cancer syndromes. We discussed her risk of harboring a mutation in the context of her personal and family history. We discussed the process of genetic testing, insurance coverage and implications of results: positive, negative and variant of unknown significance (VUS) and addressed the differences between germline and somatic testing and how results of each are used.   Ms. Poehlman questions were answered to her satisfaction today and she is welcome to call with any additional questions or concerns. Thank you for the referral and allowing [place] to share in the care of your patient.    Steele Berg, MS, [place] Certified Genetic Counselor phone: [phone] [email or web address]

## 2018-03-31 NOTE — Addendum Note (Signed)
Addended by: Desmond Dike C on: 03/31/2018 11:40 AM   Modules accepted: Orders

## 2018-04-01 ENCOUNTER — Ambulatory Visit (INDEPENDENT_AMBULATORY_CARE_PROVIDER_SITE_OTHER): Payer: Medicare Other | Admitting: Student

## 2018-04-01 ENCOUNTER — Encounter: Payer: Self-pay | Admitting: Student

## 2018-04-01 ENCOUNTER — Telehealth: Payer: Self-pay

## 2018-04-01 VITALS — BP 118/64 | HR 81 | Ht 64.0 in | Wt 164.0 lb

## 2018-04-01 DIAGNOSIS — I5032 Chronic diastolic (congestive) heart failure: Secondary | ICD-10-CM | POA: Diagnosis not present

## 2018-04-01 DIAGNOSIS — C569 Malignant neoplasm of unspecified ovary: Secondary | ICD-10-CM

## 2018-04-01 DIAGNOSIS — I10 Essential (primary) hypertension: Secondary | ICD-10-CM

## 2018-04-01 DIAGNOSIS — I480 Paroxysmal atrial fibrillation: Secondary | ICD-10-CM

## 2018-04-01 LAB — CA 125: CANCER ANTIGEN (CA) 125: 215.8 U/mL — AB (ref 0.0–38.1)

## 2018-04-01 NOTE — Telephone Encounter (Signed)
-----   Message from Ni Gorsuch, MD sent at 04/01/2018  8:26 AM EDT ----- Regarding: CA-125 pls let her know CA-125 is bit elevated but could be due to recent surgery Plan to proceed with CT as discussed ----- Message ----- From: Interface, Lab In [place] Sent: 03/31/2018   9:09 AM To: Ni Gorsuch, MD   

## 2018-04-01 NOTE — Telephone Encounter (Signed)
Na

## 2018-04-01 NOTE — Patient Instructions (Signed)
Medication Instructions:  Your physician recommends that you continue on your current medications as directed. Please refer to the Current Medication list given to you today.  Labwork: NONE  Testing/Procedures: NONE  Follow-Up: Your physician recommends that you schedule a follow-up appointment in: 2 MONTHS WITH DR. MCDOWELL  Any Other Special Instructions Will Be Listed Below (If Applicable).  If you need a refill on your cardiac medications before your next appointment, please call your pharmacy. 

## 2018-04-01 NOTE — Telephone Encounter (Signed)
-----   Message from Heath Lark, MD sent at 04/01/2018  8:26 AM EDT ----- Regarding: CA-125 pls let her know CA-125 is bit elevated but could be due to recent surgery Plan to proceed with CT as discussed ----- Message ----- From: Interface, Lab In [place] Sent: 03/31/2018   9:09 AM To: Heath Lark, MD

## 2018-04-01 NOTE — Telephone Encounter (Signed)
Called and given below message. Verbalized understanding. 

## 2018-04-02 ENCOUNTER — Inpatient Hospital Stay (HOSPITAL_BASED_OUTPATIENT_CLINIC_OR_DEPARTMENT_OTHER): Payer: Medicare Other | Admitting: Obstetrics

## 2018-04-02 ENCOUNTER — Encounter: Payer: Self-pay | Admitting: Obstetrics

## 2018-04-02 VITALS — BP 116/47 | HR 92 | Temp 98.2°F | Resp 18 | Ht 64.0 in | Wt 163.2 lb

## 2018-04-02 DIAGNOSIS — Z90722 Acquired absence of ovaries, bilateral: Secondary | ICD-10-CM

## 2018-04-02 DIAGNOSIS — C569 Malignant neoplasm of unspecified ovary: Secondary | ICD-10-CM

## 2018-04-02 DIAGNOSIS — C562 Malignant neoplasm of left ovary: Secondary | ICD-10-CM | POA: Diagnosis not present

## 2018-04-02 DIAGNOSIS — Z90710 Acquired absence of both cervix and uterus: Secondary | ICD-10-CM

## 2018-04-02 NOTE — Progress Notes (Signed)
Progress Note: Gyn-Onc   CC:  Chief Complaint  Patient presents with  . Malignant neoplasm of ovary, unspecified laterality [facility])    HPI: [name]  is a very nice 84 y.o. P1  . Interval History:   She has been receiving single agent chemotherapy since 12/13/17. She received Cycle 3 on 02/04/18.  Met the patient for the first time 02/27/2018.  At that time on my review of the CT imaging I was concerned about disease on the diaphragm when comparing the scans to several that she had back in as far as 2012.  Given the risk for high morbidity surgery and the patient's clinical scenario I recommended we obtain a PET scan to evaluate for residual metastatic disease prior to offering surgical resection.    Since her last visit she did have an admission for rectal bleeding.  She was transfused and seen by GI.  She has follow-up with the GI physicians later this week.  No her history of atrial fibrillation and she was on Xarelto which is now held.    She has appointment with pulmonary June 24.  Her cardiologist is Dr. Domenic Polite who she recently saw.  Her primary care physician is Dr. Willey Blade who she has not seen for some time.    She presents with her daughter today.  With regard to her response to therapy there appears to be stabilization of the disease. Her CA125 is hovering in the mid 100 range. Her imaging 02/25/18 agrees with probable stable disease since prior on 11/28/17. The left adnexal mass seen on the 06/2017 scan when she was diagnosed is now by my measurements 1.8x3.5cm; previously 3.5 x 2.8cm. The omental cake, compared to 06/2017 seems improved.   I am concerned about some of the findings: 1. Imaging suggesting the right sided pleural effusion is worsening; while on treatment --> we previously discussed this.  This is also present and being read as a moderate pleural effusion on the recent PET scan 2. The near plateauing of the CA125 3. What I think is diaphragmatic disease on the  right hemidiaphragm after reviewing several imaging studies dating back to 2012 --> however the PET scan does not show any abnormal uptake in this region   . Initial Presentation: She was initially seen in consultation 01/2017 at the request of Dr Lynnette Caffey for a 4cm right ovarian cyst and elevated CA 125.  The cyst was first detected on imaging (CT) in 2012 (6 years prior to initial referral) and was approximately 4cm. It was again seen on a CT scan in August, 2017 and was stable in dimension, and described by radiology as likely being benign.   Her CA 125 in May 2017 was 45. It was 80 in August, 2017.   She was scheduled for a repeat CA 125 in March, 13, 2018 this was elevated to 133.5.  It was at this time she was referred to [facility].  The patient denied abdominal bloating or early satiety and she had no vaginal bleeding.  Her major complaint was back pain which has been very bad in the past 6 months and she has been regularly on steroid tapers (most recently completed 2 weeks ago). She has received multiple joint injections for this also.  She takes Xarelto for atrial fibrillation. She has ulcerative colitis. Her bouts have been less prominent lately, likely secondary to prednisone use for her back.  CT scan on 02/07/17 was ordered due to the rising CA 125. It showed  a normal appearance of uterus. A simple appearing cystic lesion is again seen in the right adnexa which measures 3.8 x 3.7cm. This remains stable since 2012, consistent with benign etiology. No other masses or inflammatory process identified.  Given her serious medical comorbidities, including COPD, surgery was not performed for what appeared to be consistent with a benign lesion as the risk were felt to outweigh the benefits.  In August, 2018 she began experiencing increasing abdominal distension and discomfort.  On 07/07/17 she was admitted to [facility] for abdominal distension and SOB. CT imaging showed  interim finding of moderate ascites within the abdomen and pelvis with additional finding of diffuse nodular infiltration of the omentum and anterior mesenteric fat, the appearance would be consistent with peritoneal carcinomatosis/metastatic disease. Increasing retroperitoneal and upper abdominal adenopathy. Re- demonstrated 3.8 cm cyst in the right adnexa. Enlarging soft tissue density in the left adnexa now with possible cystic component posteriorly. In light of the above findings, concern is for ovarian neoplasm. Correlation with pelvic ultrasound recommended. Small right-sided pleural effusion, new since prior study Stable hypodense splenic lesions since 2017.  US guided paracentesis was performed on 07/01/17 which showed adenocarcinoma, favor gynecologic primary. She had multiple paracenteses because her ascites continued to accumulate and caused respiratory distress. In addition she had pleural effusions but these were considered too small for thoracentesis.   She initiated neoadjuvant chemotherapy due to her poor performance status. She underwent chemotherapy with Carbo/Taxol x 6 cycles 07/19/17-11/04/17. She saw Dr. Fermin Schwab after Cycle 3 and due to her poor performance status and continued oxygen requirement was felt not to be a surgical candidate at that time.   She then had PNA and was sent to rehab for a bit. She followed up with Dr. Skeet Latch 12/05/17 with initial thought for reconsideration of interval debulking, but due to the setback from the PNA and continued poor pulmonary function it was recommended against surgery at that time. She was having a difficult time with double-agent chemotherapy; experiencing debilitated state and significant arthralgias. She was changed to single agent Carboplatin receiving the first cycle of that drug 12/13/17 (about 5.5 weeks from her last Carbo/Tax dosing).   Measurement of disease:  Recent Labs    07/18/17 1421 08/09/17 0948 08/15/17 0935  09/20/17 1011 10/14/17 0838 11/04/17 0853 12/13/17 1232 01/03/18 1305 01/14/18 1343 02/04/18 1238 03/13/18 0940 03/31/18 0841  CAN125 1,941.0* 1,665.0* 937.9* 347.0* 307.4* 262.5* 197.7* 183.1* 177.4* 168.5* 178.7* 215.8*    Carbo/Taxol C1 07/19/2017 --> C6 11/04/2017 changed to C1 single agent carbo 12/13/2017 --> C3 single agent carbo 02/04/2018   Radiology:  Multiple CT scans in the system reviewed by me personally  03/07/2018-PET-moderate sized right pleural effusion, small left pleural effusion.  No hypermetabolic mediastinal or hilar nodes.  Stable hazy interstitial nodularity in the omentum without discrete measurable nodules or hyper mid to botulism.  No evidence of peritoneal surface disease involving the liver.  No hepatic metastatic disease.  No hypermetabolic abdominal or pelvic lymph nodes.  Stable 4 cm simple appearing right adnexal cyst no hypermetabolic in or around that lesion.  Left hip hardware.    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 Interim finding of moderate ascites within the abdomen and pelvis with additional finding of diffuse nodular infiltration of the omentum and anterior mesenteric fat, the appearance would be consistent with peritoneal carcinomatosis/metastatic disease. Increasing retroperitoneal and upper abdominal adenopathy. 2. Re- demonstrated 3.8 cm cyst in the right adnexa. Enlarging soft tissue density in the left adnexa now with possible cystic component posteriorly. In  light of the above findings, concern is for ovarian neoplasm. Correlation with pelvic ultrasound recommended. 3. Small right-sided pleural effusion, new since prior study 4. Stable hypodense splenic lesions since 2017.       06/25/2017 Imaging    US pelvis: 2.9 cm simple appearing cyst in the right ovary. Left ovary grossly unremarkable. Large volume ascites in the pelvis      06/30/2017 - 07/01/2017 Hospital Admission    She was admitted for evaluation of abdominal pain and ascites      07/01/2017 Pathology Results    PERITONEAL/ASCITIC FLUID(SPECIMEN 1 OF 1 COLLECTED 07/01/17): - POORLY DIFFERENTIATED CARCINOMA; SEE COMMENT Source Peritoneal/Ascitic Fluid, (specimen 1 of 1 collected 07/01/17) Gross Specimen: Received is/are 1000 cc's of brownish fluid. (BS:bs) Prepared: # Smears: 0 # Concentration Technique Slides (i.e. ThinPrep): 1 # Cell Block: 1 Additional Studies: Also received Hematology slide - [id number]. Comment The tumor cells are positive for cytokeratin 7 and Pax-8 but negative for cytokeratin 20, CDX-2, GATA-3, Napsin-A and TTF-1. Based on the immunoprofile a gynecology primary is favored      07/01/2017 Procedure    Successful ultrasound-guided diagnostic and therapeutic paracentesis yielding 2.5 liters of peritoneal fluid      07/07/2017 - 07/09/2017 Hospital Admission    She was admitted for management of malignant ascites      07/08/2017 Procedure    Successful ultrasound-guided therapeutic paracentesis yielding 2.7 liters liters of peritoneal fluid      07/12/2017 Procedure    Successful ultrasound-guided paracentesis yielding 1450 mL of peritoneal fluid      07/18/2017 - 07/24/2017 Hospital Admission    She was admitted for expedited treatment      07/18/2017 Tumor Marker    Patient's tumor was tested for the following markers: CA125 Results of the tumor marker test revealed 1941      07/19/2017 - 02/04/2018 Chemotherapy    The patient had 6 cycles of  carboplatin & Taxol for chemotherapy treatment, followed by 3 more cycles of carboplatin only       08/06/2017 Procedure    Successful ultrasound-guided therapeutic paracentesis yielding 2.6 liters of peritoneal fluid.      08/09/2017 Tumor Marker    Patient's tumor was tested for the following markers: CA125 Results of the tumor marker test revealed 1665      08/15/2017 Tumor Marker    Patient's tumor was tested for the following markers: CA125 Results of the tumor marker test revealed 937.9      08/20/2017 Imaging    ECHO: Normal LV size with EF 60-65%. Normal RV size and systolic function. No significant valvular abnormalities.      09/18/2017 Imaging    Chest Impression:  1. No evidence thoracic metastasis. 2. Interval increase and RIGHT pleural effusion.  Abdomen / Pelvis Impression:  1. Interval decrease in intraperitoneal  free fluid. 2. Interval decrease in omental nodularity in the LEFT ventral peritoneal space. 3. Interval decrease in nodularity associated with the LEFT ovary. 4. Cystic portion of the RIGHT ovary is increased mildly in size.      09/20/2017 Tumor Marker    Patient's tumor was tested for the following markers: CA125 Results of the tumor marker test revealed 347      10/14/2017 Tumor Marker    Patient's tumor was tested for the following markers: CA125 Results of the tumor marker test revealed 307.4      11/04/2017 Tumor Marker    Patient's tumor was tested for the following markers: CA125 Results of the tumor marker test revealed 262.5      11/28/2017 Imaging    1. Interval decrease in right pleural effusion with resolution of right atelectasis seen previously. 2. New small left pleural effusion, symmetric to the right. 3. No intraperitoneal free fluid on the current study. 4. Continued further decrease in left omental disease, appearing less confluent today than on the prior study. 5. Left ovary remains normal in appearance today and the  right adnexal cystic lesion is decreased in size compared to prior study. 6. 14 mm pancreatic cyst is unchanged. Continued attention on follow-up imaging recommended. 7. Aortic Atherosclerois (ICD10-170.0)      12/13/2017 Tumor Marker    Patient's tumor was tested for the following markers: CA125 Results of the tumor marker test revealed 197.7      01/03/2018 Tumor Marker    Patient's tumor was tested for the following markers: CA125 Results of the tumor marker test revealed 183.1      01/14/2018 Tumor Marker    Patient's tumor was tested for the following markers: CA125 Results of the tumor marker test revealed 177.4      02/04/2018 Tumor Marker    Patient's tumor was tested for the following markers: CA125 Results of the tumor marker test revealed 168.5      02/25/2018 Imaging    1. Omental carcinomatosis appears qualitatively stable to slightly decreased. Stable mild peritoneal thickening in the paracolic gutters. Stable right adnexal cyst. No ascites. No new or progressive metastatic disease in the abdomen or pelvis. 2. Small dependent right pleural effusion is increased. 3. Cystic pancreatic body lesion is decreased and now subcentimeter in size, suggesting a benign lesion. 4. Aortic Atherosclerosis (ICD10-I70.0).      03/03/2018 - 03/07/2018 Hospital Admission    She was hospitalized for GI bleed requiring blood transfusions. Xarelto was placed on hold      03/07/2018 PET scan    1. Persistent hazy omental interstitial nodularity but no hypermetabolism or discrete measurable nodules. No abdominal ascites. 2. No findings for metastatic disease involving the chest. 3. Moderate-sized right pleural effusion and small left pleural effusion.       03/20/2018 Pathology Results    1. Ovary and fallopian tube, right - OVARY AND FALLOPIAN TUBE INVOLVED BY SEROUS CARCINOMA. - PARATUBAL CYST. 2. Uterus +/- tubes/ovaries, neoplastic, cervix, left ovary and fallopian tube - LEFT  OVARY: HIGH GRADE SEROUS CARCINOMA WITH TREATMENT EFFECT, SPANNING 2.5 CM. CARCINOMA INVOLVES OVARIAN SURFACE. SEE ONCOLOGY TABLE. - LEFT FALLOPIAN TUBE: INVOLVED BY SEROUS CARCINOMA. - UTERUS: -ENDOMETRIUM: INACTIVE ENDOMETRIUM. NO HYPERPLASIA OR MALIGNANCY. -MYOMETRIUM: UNREMARKABLE. NO MALIGNANCY. -SEROSA: INVOLVED BY SEROUS CARCINOMA. - CERVIX: ENDOCERVICAL POLYP. NO MALIGNANCY. 3. Omentum, resection for tumor - INVOLVED BY SEROUS CARCINOMA. 4. Soft tissue, biopsy, mesenteric nodule - INVOLVED BY SEROUS CARCINOMA. Microscopic Comment 2. OVARY or FALLOPIAN TUBE or  PRIMARY PERITONEUM: Procedure: Total hysterectomy and bilateral salpingo-oophorectomy. Omentectomy. Mesenteric lymph node biopsy. Specimen Integrity: Intact. Tumor Site: Left ovary. Ovarian Surface Involvement (required only if applicable): Present. Fallopian Tube Surface Involvement (required only if applicable): Present, bilateral. Tumor Size: 2.5 cm. Histologic Type: High grade serous carcinoma. Histologic Grade: High grade. Implants (required for advanced stage serous/seromucinous borderline tumors only): N/A. Other Tissue/ Organ Involvement: Bilateral fallopian tubes, right ovary, uterine serosa, omentum. Largest Extrapelvic Peritoneal Focus (required only if applicable): Microscopic, estimated 0.5 cm (omentum). Peritoneal/Ascitic Fluid: Prior Positive ([id number]). Treatment Effect (required only for high-grade serous carcinomas): Present in left ovary. CRS2. Regional Lymph Nodes: No lymph nodes submitted/identified. Pathologic Stage Classification (pTNM, AJCC 8th Edition): ypT3b, ypNX Representative Tumor Block: 1A, 1B, 37F, 77F. Comment(s): The right ovary has only surface deposits with a large paratubal cyst. The left ovary has intraparenchymal tumor with associated treatment effect. Thus the tumor location is classified as a left ovarian primary.      03/20/2018 Surgery    Procedure(s) Performed:  1.  Exploratory laparotomy with total hysterectomy and bilateral salpingo-oophorectomy 2. Infragastic Omentectomy  3. Debulking to <1cm gross residual disease   Surgeon: Mart Piggs, MD  Specimens: Uterus Cervix, Bilateral tubes / ovaries and omentum. Mesenteric nodule.  Operative Findings: Debulked to gross residual disease <1cm; however there is miliary disease in multiple locations including the majority of the abdominal peritoneum (anterior abdominal wall, bilateral gutters), diaphragm (Right>left), majority of small bowel mesentary. Normal appendix. Normal small uterus. Right ovary with a cystic lesion ~3cm, some adhesive disease of right adnexa to rectum/sigmoid. Gross omental disease, which was resected with the omentectomy. Smooth liver surface, but again, diaphragmatic disease noted.         03/20/2018 Genetic Testing    Patient has genetic testing done for ER/PR. Results revealed patient has ER: 90%, PR 0%.       03/31/2018 Tumor Marker    Patient's tumor was tested for the following markers: CA125 Results of the tumor marker test revealed 215.8      Current Meds:  Outpatient Encounter Medications as of 04/02/2018  Medication Sig  . albuterol (PROAIR HFA) 108 (90 Base) MCG/ACT inhaler Inhale 2 puffs into the lungs every 6 (six) hours as needed for wheezing or shortness of breath.  . ALPRAZolam (XANAX) 0.25 MG tablet Take 1 tablet (0.25 mg total) by mouth at bedtime as needed for anxiety.  Marland Kitchen azelastine (ASTELIN) 0.1 % nasal spray Place 2 sprays into both nostrils 2 (two) times daily as needed for allergies.   . budesonide-formoterol (SYMBICORT) 160-4.5 MCG/ACT inhaler Inhale 2 puffs into the lungs 2 (two) times daily.  . Cholecalciferol (VITAMIN D) 2000 units CAPS Take 2,000 Units by mouth daily.  Marland Kitchen dicyclomine (BENTYL) 10 MG capsule Take 1 tab by mouth every morning. May take twice daily as needed.  . diltiazem (CARDIZEM CD) 120 MG 24 hr capsule Take 1 capsule  (120 mg total) by mouth every evening.  . diltiazem (CARDIZEM CD) 180 MG 24 hr capsule Take 1 capsule (180 mg total) by mouth every morning.  . diphenhydrAMINE (BENADRYL) 25 mg capsule Take 25 mg by mouth every 6 (six) hours as needed for itching.  . diphenoxylate-atropine (LOMOTIL) 2.5-0.025 MG tablet Take 1 tablet by mouth 4 (four) times daily. (Patient taking differently: Take 1 tablet by mouth 4 (four) times daily as needed for diarrhea or loose stools. )  . DULoxetine (CYMBALTA) 30 MG capsule TAKE 1 CAPSULE(30 MG) BY MOUTH DAILY AT NIGHT.  Marland Kitchen  enoxaparin (LOVENOX) 40 MG/0.4ML injection Inject 0.4 mLs (40 mg total) into the skin daily.  . fluticasone (FLONASE) 50 MCG/ACT nasal spray Place 2 sprays into both nostrils 2 (two) times daily as needed (FOR NASAL CONGESTION.).   Marland Kitchen furosemide (LASIX) 20 MG tablet Take 1 tablet (20 mg total) by mouth daily.  Marland Kitchen HYDROcodone-acetaminophen (NORCO) 10-325 MG tablet Take 1 tablet by mouth every 6 (six) hours as needed.  Marland Kitchen ipratropium-albuterol (DUONEB) 0.5-2.5 (3) MG/3ML SOLN Take 3 mLs by nebulization 3 (three) times daily. (Patient taking differently: Take 3 mLs by nebulization every 6 (six) hours as needed (cough or SOB). As needed)  . levothyroxine (SYNTHROID, LEVOTHROID) 137 MCG tablet Take 137 mcg by mouth daily before breakfast. For thyroid therapy  . lidocaine-prilocaine (EMLA) cream Apply 1 application topically as needed. (Patient taking differently: Apply 1 application topically as needed (port). )  . mirtazapine (REMERON) 15 MG tablet TAKE 1 TABLET(15 MG) BY MOUTH AT BEDTIME  . morphine (MS CONTIN) 15 MG 12 hr tablet Take 1 tablet (15 mg total) by mouth every 12 (twelve) hours.  . ondansetron (ZOFRAN) 8 MG tablet Take 8 mg by mouth every 8 (eight) hours as needed for nausea or vomiting.  . polyvinyl alcohol (LIQUIFILM TEARS) 1.4 % ophthalmic solution 1 drop 2 (two) times daily as needed for dry eyes.  . potassium chloride (K-DUR) 10 MEQ tablet Take 10  mEq by mouth daily.  . Tiotropium Bromide-Olodaterol (STIOLTO RESPIMAT) 2.5-2.5 MCG/ACT AERS Inhale 2 puffs into the lungs daily. (Patient not taking: Reported on 04/02/2018)  . [DISCONTINUED] Calcium Carbonate (CALCIUM 600) 1500 MG TABS Take 2 tablets by mouth daily.     No facility-administered encounter medications on file as of 04/02/2018.     Allergy:  Allergies  Allergen Reactions  . Codeine Itching    Social Hx:   Tobacco Use: Former smoker with 20-pack-year quit in 1982 Alcohol Use: Rare Illicit Drug Use: None   Past Surgical Hx:  Past Surgical History:  Procedure Laterality Date  . CARDIOVASCULAR STRESS TEST  09/23/2012   Low risk lexiscan nuclear study w/ apical thinning but no evidence of ischemia/  normal LV function and wall motion , ef 75%  . CATARACT EXTRACTION W/ INTRAOCULAR LENS  IMPLANT, BILATERAL  10/2016  . COLONOSCOPY    . DEBULKING N/A 03/20/2018   Procedure: DEBULKING;  Surgeon: Isabel Caprice, MD;  Location: [facility];  Service: Gynecology;  Laterality: N/A;  . EXAM UNDER ANESTHESIA WITH MANIPULATION OF KNEE Left 12-20-2003  dr Noemi Chapel   post TKA  . FEMUR IM NAIL Left 12/11/2013   Procedure: INTRAMEDULLARY (IM) NAIL FEMORAL;  Surgeon: [name], MD;  Location: [facility];  Service: Orthopedics;  Laterality: Left;  . HYSTERECTOMY ABDOMINAL WITH SALPINGO-OOPHORECTOMY Bilateral 03/20/2018   Procedure: TOTAL HYSTERECTOMY ABDOMINAL WITH BILATERAL  SALPINGO-OOPHORECTOMY;  Surgeon: Isabel Caprice, MD;  Location: [facility];  Service: Gynecology;  Laterality: Bilateral;  . IR FLUORO GUIDE PORT INSERTION RIGHT  07/22/2017  . IR PARACENTESIS  07/12/2017  . IR THORACENTESIS ASP PLEURAL SPACE W/IMG GUIDE  03/17/2018  . IR US GUIDE VASC ACCESS RIGHT  07/22/2017  . KNEE ARTHROSCOPY W/ LATERAL RELEASE Left 09-03-2005   dr Noemi Chapel  [facility]   w/  Lysis Adhesions,  excision loose body's  . LAPAROSCOPIC CHOLECYSTECTOMY  12-04-2010  dr zeigler  . LAPAROTOMY N/A 03/20/2018   Procedure:  EXPLORATORY LAPAROTOMY;  Surgeon: Isabel Caprice, MD;  Location: [facility];  Service: Gynecology;  Laterality: N/A;  .  OMENTECTOMY N/A 03/20/2018   Procedure: OMENTECTOMY;  Surgeon: Isabel Caprice, MD;  Location: [facility];  Service: Gynecology;  Laterality: N/A;  . TOTAL KNEE ARTHROPLASTY Left 09-08-2003   dr Noemi Chapel  [facility]  . TOTAL KNEE REVISION  08/06/2012   Procedure: TOTAL KNEE REVISION;  Surgeon: [name], MD;  Location: [facility];  Service: Orthopedics;  Laterality: Left;  Left Total Knee Arthroplasty Revision  . TRANSTHORACIC ECHOCARDIOGRAM  08/20/2017   ef 60-65%,  grade 1 diastolic dysfunction/  trivial AR and TR  . Uterine polypectomy      Past Medical Hx:  Past Medical History:  Diagnosis Date  . Anxiety   . Chronic blood loss anemia    03-04-2018 diverticular bleed and rectal bleeding,  transfused 2 units PRBCs 03-08-2018  . COPD (chronic obstructive pulmonary disease) ([place])    pulmologist-  dr byrum  . Depression   . Diastolic CHF, chronic ([place])    followed by cardiology  . Diverticulosis   . Dyspnea on effort   . Family history of colon cancer   . Family hx of colon cancer   . Fibromyalgia   . GERD (gastroesophageal reflux disease)    03-17-2018 per pt has not had any gerd issues in long time and no meds prn  . Hemorrhoids   . Hiatal hernia   . History of lower GI bleeding 03/04/2018   admission--  dx diverticular bleed and rectal bleed in setting of xarelto-- xarelto stopped and transfused 03-08-2018 2 units PRBCs  . History of rectal polyps   . History of shingles   . Hypothyroidism   . IBS (irritable bowel syndrome)   . Lymphocytic colitis    FOLLOWED BY DR Henrene Pastor  . Malignant ascites    dx at admission 09/ 2018 abdominal s/p  parencentesis 07-01-2017 2.5L,  07-08-2016  2.7L,  07-12-2017  1433m  . [place] drug [place]   . Osteoporosis   . Ovarian cancer ([facility] oncologist-  g[place]/  dr pGerarda Fraction  chemotherapy 07-19-2017 to 11-04-2017   . [place]oxysmal [place]    CARDIOLOGIST-  DR S. MCDOWELL-  first dx'd 07/ 2014---was taking xarelto up until 03-07-2018 stopped due to lower GI bleed  . Pleural effusion    s/p  right thoracentesis, 02-2018 1.3L and 03-17-2018 right thoracentesis 6425m, post cxr no residual effusion  . [place]iatic [place]  . Schatzki's ring    S/P  DILATERAL 2013  . Seasonal allergic rhinitis   . Wears glasses   . Wears partial dentures    LOWER RIGHT SIDE    Past Gynecological History:   GYNECOLOGIC HISTORY:  No LMP recorded. Patient is postmenopausal. P 1 LMP 40s Contraceptive none HRT none    Family Hx:  Family History  Problem Relation Age of Onset  . Heart disease Father        pacemaker  . Lung cancer Father        smoker; deceased [id number]. Colon cancer Mother [id number]     2nd rectal ca at [id number]. Colon cancer Maternal Grandfather        dx [id number] [id number]. Depression Sister   . Leukemia Maternal Aunt        deceased [phone]  Review of Systems:  Review of Systems  Respiratory: Positive for shortness of breath.   Endocrine: Positive for hot flashes.  All other systems reviewed and are negative.   Vitals:  Blood pressure (!) 116/47, pulse 92, temperature 98.2 F (36.8 C), temperature source Oral, resp. rate 18, height _0  (1.626 m), weight 163 lb 3.2 oz (74 kg), SpO2 97 %. Body mass index is 28.01 kg/m.  Physical Exam: ECOG PERFORMANCE STATUS: 0 - Asymptomatic   General :  Well developed, 84 y.o., female in no apparent distress HEENT:  Normocephalic/atraumatic, symmetric, EOMI, eyelids normal Neck:   No visible masses.  Respiratory:  Respirations unlabored, no use of accessory muscles CV:   Deferred Breast:  Deferred Musculoskeletal: Normal muscle strength. Abdomen:  Wounds CDI.  No visible masses or protrusion Extremities:  No visible edema or deformities Skin:   Normal inspection Neuro/Psych:  No focal motor deficit, no abnormal mental status.  Normal gait. Normal affect. Alert and oriented to person, place, and time        Oncologic Summary: 1. 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  Prolonged chemotherapy treatment due to poor performance status see details above    Assessment/Plan: 1. Management recommendations o As discussed with the patient preoperatively it would be my preference she complete systemic chemotherapy for another 9 weeks (3 cycles) if Dr. Alvy Bimler thinks she will tolerate.  o I advised that there was no residual disease >1cm at the end of her surgery. I suspect the imaging planned in the coming days will not show gross disease. o Without additional systemic treatment though, the surgery will have been futile. 2. She has been referred to genetics already 3. I don't see BRCA testing on the tumor tissue. I will ask Melissa if pathology plans to perform this testing. 4. Return in one month for a vaginal cuff check.  15 minutes of direct face to face counseling time was spent with the patient. This included discussion about prognosis, therapy recommendations including chemotherapy, and postoperative side effects that are beyond the scope of routine postoperative care.   Isabel Caprice, MD  04/02/2018, 5:02 PM   Cc: Asencion Noble, MD (PCP) Heath Lark, MD (Medical Oncology)

## 2018-04-02 NOTE — Patient Instructions (Signed)
1. Return in one month for vaginal healing check 2. Still no heavy lifting, nothing in the vagina

## 2018-04-03 ENCOUNTER — Telehealth: Payer: Self-pay | Admitting: Student

## 2018-04-03 NOTE — Telephone Encounter (Signed)
   She was supposed to have discussed this with the surgeon yesterday. I reviewed her surgeon's note and do not see where she mentioned stopping Lovenox. Did they discuss this with the patient? If they said she can switch from Lovenox to oral anticoagulation, we can either restart Xarelto or initiate Eliquis as at the time of her visit with myself on 6/25, she did not wish to restart Xarelto and wanted to consider an alternative to this.   Signed, Erma Heritage, PA-C 04/03/2018, 11:55 AM Pager: [phone]

## 2018-04-03 NOTE — Telephone Encounter (Signed)
Daughter said they need to know if she can start xarelto  Today, she has not taken lovenox today

## 2018-04-03 NOTE — Telephone Encounter (Signed)
Patient's daughter is calling regarding starting Xarelto / tg

## 2018-04-03 NOTE — Telephone Encounter (Signed)
I spoke with daughter, she states they will restart Xarelto today.

## 2018-04-04 ENCOUNTER — Encounter (HOSPITAL_COMMUNITY): Payer: Self-pay | Admitting: Hematology and Oncology

## 2018-04-07 ENCOUNTER — Ambulatory Visit: Payer: Self-pay | Admitting: Genetic Counselor

## 2018-04-07 ENCOUNTER — Encounter: Payer: Self-pay | Admitting: Genetic Counselor

## 2018-04-07 DIAGNOSIS — Z1379 Encounter for other screening for genetic and chromosomal anomalies: Secondary | ICD-10-CM

## 2018-04-07 HISTORY — DX: Encounter for other screening for genetic and chromosomal anomalies: Z13.79

## 2018-04-07 NOTE — Progress Notes (Addendum)
[facility]       Genetic Test Results    Patient Name: [name] Patient DOB: 1933-08-17 Patient Age: 84 y.o. Encounter Date: 04/07/2018  Referring Provider: Heath Lark, MD  Primary Care Provider: Asencion Noble, MD   [name] was called today to discuss genetic test results. Please see the Genetics note from her visit on 03/31/2018 for a detailed discussion of her personal and family history.  Genetic Testing: At the time of [name] visit, she decided to pursue genetic testing of multiple genes associated with hereditary susceptibility to cancer. Testing included analysis of both germline and somatic tissue. In regards to germline testing, testing did not reveal a pathogenic mutation in any of the genes analyzed. Somatic HRD test results were also negative. There was no genomic instability detected and no BRCA1/BRCA2 mutations in the tumor. A copy of both results will be scanned into Epic under the Media tab.  The germline genes analyzed were the 35 genes on [facility]'s MyRisk panel: APC, ATM, AXIN2, BARD1, BMPR1A, BRCA1, BRCA2, BRIP1, CDH1, CDK4, CDKN2A, CHEK2, EPCAM, GREM1, HOXB13, GALNT12, MLH1, MSH2, MSH3, MSH6, MUTYH, NBN, NTHL1, PALB2, PMS2, POLD1, POLE, PTEN, RAD51C, RAD51D, RNF43, RPS20, SMAD4, STK11, TP53.   A Variant of Uncertain Significance was detected: NTHL1 c.527T>C (p.Ile176Thr). This is still considered a normal result. While at this time, it is unknown if this finding is associated with increased cancer risk, the majority of these variants get reclassified to be inconsequential. Medical management should not be based on this finding. With time, we suspect the lab will determine the significance, if any. If we do learn more about it, we will try to contact [name] to discuss it further. It is important to stay in touch with [place] periodically and keep the address and phone number up to date.  Since the current test is not perfect, it  is possible that there may be a gene mutation that current testing cannot detect, but that chance is small. It is possible that a different genetic factor, which has not yet been discovered or is not on this panel, is responsible for the cancer diagnoses in the family. Again, the likelihood of this is low. No additional testing is recommended at this time for [name].  Cancer Screening: These results suggest that [name]'s cancer was most likely not due to an inherited predisposition. Most cancers happen by chance and this test, along with details of her family history, suggests that her cancer falls into this category. She is recommended to follow the cancer screening guidelines provided by her physician.   Family Members: Family members are at some increased risk of developing cancer, over the general population risk, simply due to the family history. They are recommended to speak with their own providers about appropriate cancer screenings.   Any relative who had cancer at a young age or had a particularly rare cancer may also wish to pursue genetic testing. Genetic counselors can be located in other cities, by visiting the website of the [employer] ([employer]) and Field seismologist for a Dietitian by zip code.   Family members should not pursue testing for the above VUS outside of a research setting as it has no implications for their medical management.  Lastly, cancer genetics is a rapidly advancing field and it is possible that new genetic tests will be appropriate for [name] in  the future. [name] is encouraged to remain in contact with Genetics on an annual basis so we can update her personal and family histories, and let her know of advances in cancer genetics that may benefit the family. [name]'s questions were answered to her satisfaction today, and she knows she is welcome to call anytime with additional questions.      , MS,  CGC Certified Genetic Counselor phone: [phone] 

## 2018-04-14 ENCOUNTER — Other Ambulatory Visit (HOSPITAL_COMMUNITY)
Admission: RE | Admit: 2018-04-14 | Discharge: 2018-04-14 | Disposition: A | Discharge status: Home / Self Care | Payer: Medicare Other | Source: Ambulatory Visit | Attending: Gynecologic Oncology | Admitting: Gynecologic Oncology

## 2018-04-14 DIAGNOSIS — C561 Malignant neoplasm of right ovary: Secondary | ICD-10-CM | POA: Diagnosis present

## 2018-04-16 ENCOUNTER — Other Ambulatory Visit: Payer: Self-pay

## 2018-04-16 ENCOUNTER — Encounter: Payer: Self-pay | Admitting: Genetic Counselor

## 2018-04-17 ENCOUNTER — Other Ambulatory Visit: Payer: Self-pay | Admitting: Hematology and Oncology

## 2018-04-21 ENCOUNTER — Inpatient Hospital Stay: Payer: Medicare Other | Attending: Hematology and Oncology

## 2018-04-21 ENCOUNTER — Ambulatory Visit (HOSPITAL_COMMUNITY)
Admission: RE | Admit: 2018-04-21 | Discharge: 2018-04-21 | Disposition: A | Discharge status: Home / Self Care | Payer: Medicare Other | Source: Ambulatory Visit | Attending: Hematology and Oncology | Admitting: Hematology and Oncology

## 2018-04-21 ENCOUNTER — Inpatient Hospital Stay: Payer: Medicare Other

## 2018-04-21 ENCOUNTER — Encounter (HOSPITAL_COMMUNITY): Payer: Self-pay

## 2018-04-21 DIAGNOSIS — I700 Atherosclerosis of aorta: Secondary | ICD-10-CM | POA: Diagnosis not present

## 2018-04-21 DIAGNOSIS — C562 Malignant neoplasm of left ovary: Secondary | ICD-10-CM

## 2018-04-21 DIAGNOSIS — Z90710 Acquired absence of both cervix and uterus: Secondary | ICD-10-CM | POA: Insufficient documentation

## 2018-04-21 DIAGNOSIS — Z90722 Acquired absence of ovaries, bilateral: Secondary | ICD-10-CM | POA: Insufficient documentation

## 2018-04-21 DIAGNOSIS — C569 Malignant neoplasm of unspecified ovary: Secondary | ICD-10-CM | POA: Insufficient documentation

## 2018-04-21 DIAGNOSIS — J90 Pleural effusion, not elsewhere classified: Secondary | ICD-10-CM | POA: Diagnosis not present

## 2018-04-21 DIAGNOSIS — K869 Disease of pancreas, unspecified: Secondary | ICD-10-CM | POA: Diagnosis not present

## 2018-04-21 LAB — CBC WITH DIFFERENTIAL/PLATELET
BASOS ABS: 0.1 10*3/uL (ref 0.0–0.1)
BASOS PCT: 1 %
Eosinophils Absolute: 0.6 10*3/uL — ABNORMAL HIGH (ref 0.0–0.5)
Eosinophils Relative: 7 %
HEMATOCRIT: 33.9 % — AB (ref 34.8–46.6)
HEMOGLOBIN: 11.6 g/dL (ref 11.6–15.9)
Lymphocytes Relative: 18 %
Lymphs Abs: 1.6 10*3/uL (ref 0.9–3.3)
MCH: 33.5 pg (ref 25.1–34.0)
MCHC: 34.3 g/dL (ref 31.5–36.0)
MCV: 97.5 fL (ref 79.5–101.0)
MONOS PCT: 9 %
Monocytes Absolute: 0.8 10*3/uL (ref 0.1–0.9)
NEUTROS ABS: 5.7 10*3/uL (ref 1.5–6.5)
NEUTROS PCT: 65 %
Platelets: 174 10*3/uL (ref 145–400)
RBC: 3.48 MIL/uL — ABNORMAL LOW (ref 3.70–5.45)
RDW: 12.8 % (ref 11.2–14.5)
WBC: 8.8 10*3/uL (ref 3.9–10.3)

## 2018-04-21 LAB — COMPREHENSIVE METABOLIC PANEL
ALBUMIN: 3.8 g/dL (ref 3.5–5.0)
ALK PHOS: 54 U/L (ref 38–126)
ALT: 11 U/L (ref 0–44)
AST: 14 U/L — AB (ref 15–41)
Anion gap: 8 (ref 5–15)
BILIRUBIN TOTAL: 0.4 mg/dL (ref 0.3–1.2)
BUN: 15 mg/dL (ref 8–23)
CALCIUM: 9.3 mg/dL (ref 8.9–10.3)
CO2: 30 mmol/L (ref 22–32)
CREATININE: 0.9 mg/dL (ref 0.44–1.00)
Chloride: 98 mmol/L (ref 98–111)
GFR calc Af Amer: >60 mL/min (ref >60)
GFR calc non Af Amer: 58 mL/min — ABNORMAL LOW (ref >60)
GLUCOSE: 86 mg/dL (ref 70–99)
Potassium: 4.2 mmol/L (ref 3.5–5.1)
Sodium: 136 mmol/L (ref 135–145)
Total Protein: 7.4 g/dL (ref 6.5–8.1)

## 2018-04-21 LAB — IRON AND TIBC
Iron: 50 ug/dL (ref 41–142)
SATURATION RATIOS: 17 % — AB (ref 21–57)
TIBC: 291 ug/dL (ref 236–444)
UIBC: 241 ug/dL

## 2018-04-21 LAB — FERRITIN: Ferritin: 96 ng/mL (ref 11–307)

## 2018-04-21 MED ORDER — IOPAMIDOL (ISOVUE-300) INJECTION 61%
INTRAVENOUS | Status: AC
  Filled 2018-04-21: qty 100

## 2018-04-21 MED ORDER — SODIUM CHLORIDE 0.9% FLUSH
10.0000 mL | Freq: Once | INTRAVENOUS | Status: AC
  Administered 2018-04-21: 10 mL
  Filled 2018-04-21: qty 10

## 2018-04-21 MED ORDER — IOPAMIDOL (ISOVUE-300) INJECTION 61%
100.0000 mL | Freq: Once | INTRAVENOUS | Status: AC | PRN
  Administered 2018-04-21: 100 mL via INTRAVENOUS

## 2018-04-21 MED ORDER — HEPARIN SOD (PORK) LOCK FLUSH 100 UNIT/ML IV SOLN
500.0000 [IU] | Freq: Once | INTRAVENOUS | Status: AC
  Administered 2018-04-21: 500 [IU] via INTRAVENOUS

## 2018-04-21 MED ORDER — HEPARIN SOD (PORK) LOCK FLUSH 100 UNIT/ML IV SOLN
INTRAVENOUS | Status: AC
  Filled 2018-04-21: qty 5

## 2018-04-21 NOTE — Patient Instructions (Signed)
Implanted Port Home Guide An implanted port is a type of central line that is placed under the skin. Central lines are used to provide IV access when treatment or nutrition needs to be given through a person's veins. Implanted ports are used for long-term IV access. An implanted port may be placed because:  You need IV medicine that would be irritating to the small veins in your hands or arms.  You need long-term IV medicines, such as antibiotics.  You need IV nutrition for a long period.  You need frequent blood draws for lab tests.  You need dialysis.  Implanted ports are usually placed in the chest area, but they can also be placed in the upper arm, the abdomen, or the leg. An implanted port has two main parts:  Reservoir. The reservoir is round and will appear as a small, raised area under your skin. The reservoir is the part where a needle is inserted to give medicines or draw blood.  Catheter. The catheter is a thin, flexible tube that extends from the reservoir. The catheter is placed into a large vein. Medicine that is inserted into the reservoir goes into the catheter and then into the vein.  How will I care for my incision site? Do not get the incision site wet. Bathe or shower as directed by your health care provider. How is my port accessed? Special steps must be taken to access the port:  Before the port is accessed, a numbing cream can be placed on the skin. This helps numb the skin over the port site.  Your health care provider uses a sterile technique to access the port. ? Your health care provider must put on a mask and sterile gloves. ? The skin over your port is cleaned carefully with an antiseptic and allowed to dry. ? The port is gently pinched between sterile gloves, and a needle is inserted into the port.  Only "non-coring" port needles should be used to access the port. Once the port is accessed, a blood return should be checked. This helps ensure that the port  is in the vein and is not clogged.  If your port needs to remain accessed for a constant infusion, a clear (transparent) bandage will be placed over the needle site. The bandage and needle will need to be changed every week, or as directed by your health care provider.  Keep the bandage covering the needle clean and dry. Do not get it wet. Follow your health care provider's instructions on how to take a shower or bath while the port is accessed.  If your port does not need to stay accessed, no bandage is needed over the port.  What is flushing? Flushing helps keep the port from getting clogged. Follow your health care provider's instructions on how and when to flush the port. Ports are usually flushed with saline solution or a medicine called heparin. The need for flushing will depend on how the port is used.  If the port is used for intermittent medicines or blood draws, the port will need to be flushed: ? After medicines have been given. ? After blood has been drawn. ? As part of routine maintenance.  If a constant infusion is running, the port may not need to be flushed.  How long will my port stay implanted? The port can stay in for as long as your health care provider thinks it is needed. When it is time for the port to come out, surgery will be   done to remove it. The procedure is similar to the one performed when the port was put in. When should I seek immediate medical care? When you have an implanted port, you should seek immediate medical care if:  You notice a bad smell coming from the incision site.  You have swelling, redness, or drainage at the incision site.  You have more swelling or pain at the port site or the surrounding area.  You have a fever that is not controlled with medicine.  This information is not intended to replace advice given to you by your health care provider. Make sure you discuss any questions you have with your health care provider. Document  Released: 09/24/2005 Document Revised: 03/01/2016 Document Reviewed: 06/01/2013 Elsevier Interactive Patient Education  2017 Elsevier Inc.  

## 2018-04-22 ENCOUNTER — Telehealth: Payer: Self-pay | Admitting: Hematology and Oncology

## 2018-04-22 ENCOUNTER — Inpatient Hospital Stay (HOSPITAL_BASED_OUTPATIENT_CLINIC_OR_DEPARTMENT_OTHER): Payer: Medicare Other | Admitting: Hematology and Oncology

## 2018-04-22 ENCOUNTER — Encounter: Payer: Self-pay | Admitting: Hematology and Oncology

## 2018-04-22 VITALS — BP 104/44 | HR 81 | Temp 98.1°F | Resp 18 | Ht 64.0 in | Wt 164.1 lb

## 2018-04-22 DIAGNOSIS — Z90722 Acquired absence of ovaries, bilateral: Secondary | ICD-10-CM

## 2018-04-22 DIAGNOSIS — J90 Pleural effusion, not elsewhere classified: Secondary | ICD-10-CM

## 2018-04-22 DIAGNOSIS — C569 Malignant neoplasm of unspecified ovary: Secondary | ICD-10-CM | POA: Diagnosis not present

## 2018-04-22 DIAGNOSIS — Z90710 Acquired absence of both cervix and uterus: Secondary | ICD-10-CM

## 2018-04-22 DIAGNOSIS — M6281 Muscle weakness (generalized): Secondary | ICD-10-CM

## 2018-04-22 DIAGNOSIS — C562 Malignant neoplasm of left ovary: Secondary | ICD-10-CM

## 2018-04-22 DIAGNOSIS — R531 Weakness: Secondary | ICD-10-CM

## 2018-04-22 DIAGNOSIS — L4050 Arthropathic psoriasis, unspecified: Secondary | ICD-10-CM

## 2018-04-22 DIAGNOSIS — T451X5A Adverse effect of antineoplastic and immunosuppressive drugs, initial encounter: Secondary | ICD-10-CM

## 2018-04-22 DIAGNOSIS — G620 Drug-induced polyneuropathy: Secondary | ICD-10-CM

## 2018-04-22 LAB — CA 125: Cancer Antigen (CA) 125: 187.3 U/mL — ABNORMAL HIGH (ref 0.0–38.1)

## 2018-04-22 MED ORDER — MORPHINE SULFATE ER 15 MG PO TBCR: 15.0000 mg | EXTENDED_RELEASE_TABLET | Freq: Two times a day (BID) | ORAL | 0 refills | Status: DC

## 2018-04-22 MED ORDER — LETROZOLE 2.5 MG PO TABS: 2.5000 mg | ORAL_TABLET | Freq: Every day | ORAL | 3 refills | Status: DC

## 2018-04-22 NOTE — Progress Notes (Signed)
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[facility]) I have reviewed imaging study with the patient and her daughter CT scan showed no frank residual disease Her tumor markers have improved since last visit Based on observation from her GYN oncologist, there is no doubt I suspect the patient might still have residual disease albeit may be at an microscopic level She had significant side effects from recent treatment including residual peripheral neuropathy Given strong ER positivity from her tumor cells, I recommend antiestrogen therapy in the next few months in an attempt to spare her from side effects of chemotherapy, along with close surveillance tumor marker monitoring, history and physical examination I plan to repeat CT imaging in 3 months, due around October 2019 Given her significant cardiovascular risk factors, I am trying to reluctant to prescribe bevacizumab The risks, benefits, side effects from antiestrogen therapy I discussed with the patient and her daughter and she agreed to proceed Plan to see her back within the month for further follow-up and supportive care  Peripheral neuropathy due to chemotherapy [facility]) She has residual side effects of neuropathy from prior treatment She will continue Cymbalta  Generalized weakness She has recurrent of generalized weakness since surgery I recommend outpatient physical therapy and rehabilitation and she agreed  Pleural effusion She had mild, persistent pleural effusion but not symptomatic She is no longer using oxygen on a regular basis She has  appointment to see pulmonologist and I would defer to them for further management  Psoriatic arthritis [facility]) She had recent mild flare of psoriatic arthritis She is requesting refill of her prescription pain medicine which helps with her arthritic pain along with her neuropathic pain I refilled her prescription but advised her to contact her rheumatologist  for further evaluation and management of arthritis   Orders Placed This Encounter  Procedures  . Ambulatory referral to Physical Therapy    Referral Priority:   Routine    Referral Type:   Physical Medicine    Referral Reason:   Specialty Services Required    Requested Specialty:   Physical Therapy    Number of Visits Requested:   1    INTERVAL HISTORY: Please see below for problem oriented charting. She returns with her daughter and caregiver for further follow-up Since recent surgery, she complained of generalized weakness/fatigue Her appetite is stable She complained of mild residual abdominal bloating since surgery but denies constipation or nausea Her weight is stable Her shortness of breath has improved and she is no longer using oxygen on a regular basis She complained also of recent flare of arthritis and pain She denies recent diarrhea from her colitis The patient denies any recent signs or symptoms of bleeding such as spontaneous epistaxis, hematuria or hematochezia.  SUMMARY OF ONCOLOGIC HISTORY: Oncology History   High grade serous ER 90%, PR 0% BRCA 1: no loss of expression MMR normal      Left ovarian epithelial cancer ([place])   02/18/2016 Tumor Marker    Patient's tumor was tested for the following markers: CA125 Results of the tumor marker test revealed 45      05/22/2016 Tumor Marker    Patient's tumor was  tested for the following markers: CA125 Results of the tumor marker test revealed 53      05/22/2016 Imaging    Outside pelvic US showed 4.1 cm adnexa mass      06/24/2017 Imaging    Ct abdomen  and pelvis:  1. Interim finding of moderate ascites within the abdomen and pelvis with additional finding of diffuse nodular infiltration of the omentum and anterior mesenteric fat, the appearance would be consistent with peritoneal carcinomatosis/metastatic disease. Increasing retroperitoneal and upper abdominal adenopathy. 2. Re- demonstrated 3.8 cm cyst in the right adnexa. Enlarging soft tissue density in the left adnexa now with possible cystic component posteriorly. In light of the above findings, concern is for ovarian neoplasm. Correlation with pelvic ultrasound recommended. 3. Small right-sided pleural effusion, new since prior study 4. Stable hypodense splenic lesions since 2017.       06/25/2017 Imaging    US pelvis: 2.9 cm simple appearing cyst in the right ovary. Left ovary grossly unremarkable. Large volume ascites in the pelvis      06/30/2017 - 07/01/2017 Hospital Admission    She was admitted for evaluation of abdominal pain and ascites      07/01/2017 Pathology Results    PERITONEAL/ASCITIC FLUID(SPECIMEN 1 OF 1 COLLECTED 07/01/17): - POORLY DIFFERENTIATED CARCINOMA; SEE COMMENT Source Peritoneal/Ascitic Fluid, (specimen 1 of 1 collected 07/01/17) Gross Specimen: Received is/are 1000 cc's of brownish fluid. (BS:bs) Prepared: # Smears: 0 # Concentration Technique Slides (i.e. ThinPrep): 1 # Cell Block: 1 Additional Studies: Also received Hematology slide - [id number]. Comment The tumor cells are positive for cytokeratin 7 and Pax-8 but negative for cytokeratin 20, CDX-2, GATA-3, Napsin-A and TTF-1. Based on the immunoprofile a gynecology primary is favored      07/01/2017 Procedure    Successful ultrasound-guided diagnostic and therapeutic paracentesis yielding 2.5 liters of peritoneal fluid      07/07/2017 - 07/09/2017 Hospital Admission    She was admitted for management of malignant ascites      07/08/2017 Procedure    Successful ultrasound-guided therapeutic  paracentesis yielding 2.7 liters liters of peritoneal fluid      07/12/2017 Procedure    Successful ultrasound-guided paracentesis yielding 1450 mL of peritoneal fluid      07/18/2017 - 07/24/2017 Hospital Admission    She was admitted for expedited treatment      07/18/2017 Tumor Marker    Patient's tumor was tested for the following markers: CA125 Results of the tumor marker test revealed 1941      07/19/2017 - 02/04/2018 Chemotherapy    The patient had 6 cycles of carboplatin & Taxol for chemotherapy treatment, followed by 3 more cycles of carboplatin only       08/06/2017 Procedure    Successful ultrasound-guided therapeutic paracentesis yielding 2.6 liters of peritoneal fluid.      08/09/2017 Tumor Marker    Patient's tumor was tested for the following markers: CA125 Results of the tumor marker test revealed 1665      08/15/2017 Tumor Marker    Patient's tumor was tested for the following markers: CA125 Results of the tumor marker test revealed 937.9      08/20/2017 Imaging    ECHO: Normal LV size with EF 60-65%. Normal RV size and systolic function. No significant valvular abnormalities.      09/18/2017 Imaging    Chest Impression:  1. No evidence thoracic metastasis. 2. Interval increase and RIGHT pleural effusion.  Abdomen / Pelvis Impression:  1. Interval decrease in intraperitoneal free fluid.  2. Interval decrease in omental nodularity in the LEFT ventral peritoneal space. 3. Interval decrease in nodularity associated with the LEFT ovary. 4. Cystic portion of the RIGHT ovary is increased mildly in size.      09/20/2017 Tumor Marker    Patient's tumor was tested for the following markers: CA125 Results of the tumor marker test revealed 347      10/14/2017 Tumor Marker    Patient's tumor was tested for the following markers: CA125 Results of the tumor marker test revealed 307.4      11/04/2017 Tumor Marker    Patient's tumor was tested for the  following markers: CA125 Results of the tumor marker test revealed 262.5      11/28/2017 Imaging    1. Interval decrease in right pleural effusion with resolution of right atelectasis seen previously. 2. New small left pleural effusion, symmetric to the right. 3. No intraperitoneal free fluid on the current study. 4. Continued further decrease in left omental disease, appearing less confluent today than on the prior study. 5. Left ovary remains normal in appearance today and the right adnexal cystic lesion is decreased in size compared to prior study. 6. 14 mm pancreatic cyst is unchanged. Continued attention on follow-up imaging recommended. 7. Aortic Atherosclerois (ICD10-170.0)      12/13/2017 Tumor Marker    Patient's tumor was tested for the following markers: CA125 Results of the tumor marker test revealed 197.7      01/03/2018 Tumor Marker    Patient's tumor was tested for the following markers: CA125 Results of the tumor marker test revealed 183.1      01/14/2018 Tumor Marker    Patient's tumor was tested for the following markers: CA125 Results of the tumor marker test revealed 177.4      02/04/2018 Tumor Marker    Patient's tumor was tested for the following markers: CA125 Results of the tumor marker test revealed 168.5      02/25/2018 Imaging    1. Omental carcinomatosis appears qualitatively stable to slightly decreased. Stable mild peritoneal thickening in the paracolic gutters. Stable right adnexal cyst. No ascites. No new or progressive metastatic disease in the abdomen or pelvis. 2. Small dependent right pleural effusion is increased. 3. Cystic pancreatic body lesion is decreased and now subcentimeter in size, suggesting a benign lesion. 4. Aortic Atherosclerosis (ICD10-I70.0).      03/03/2018 - 03/07/2018 Hospital Admission    She was hospitalized for GI bleed requiring blood transfusions. Xarelto was placed on hold      03/07/2018 PET scan    1. Persistent hazy  omental interstitial nodularity but no hypermetabolism or discrete measurable nodules. No abdominal ascites. 2. No findings for metastatic disease involving the chest. 3. Moderate-sized right pleural effusion and small left pleural effusion.       03/20/2018 Pathology Results    1. Ovary and fallopian tube, right - OVARY AND FALLOPIAN TUBE INVOLVED BY SEROUS CARCINOMA. - PARATUBAL CYST. 2. Uterus +/- tubes/ovaries, neoplastic, cervix, left ovary and fallopian tube - LEFT OVARY: HIGH GRADE SEROUS CARCINOMA WITH TREATMENT EFFECT, SPANNING 2.5 CM. CARCINOMA INVOLVES OVARIAN SURFACE. SEE ONCOLOGY TABLE. - LEFT FALLOPIAN TUBE: INVOLVED BY SEROUS CARCINOMA. - UTERUS: -ENDOMETRIUM: INACTIVE ENDOMETRIUM. NO HYPERPLASIA OR MALIGNANCY. -MYOMETRIUM: UNREMARKABLE. NO MALIGNANCY. -SEROSA: INVOLVED BY SEROUS CARCINOMA. - CERVIX: ENDOCERVICAL POLYP. NO MALIGNANCY. 3. Omentum, resection for tumor - INVOLVED BY SEROUS CARCINOMA. 4. Soft tissue, biopsy, mesenteric nodule - INVOLVED BY SEROUS CARCINOMA. Microscopic Comment 2. OVARY or FALLOPIAN TUBE or PRIMARY PERITONEUM:  Procedure: Total hysterectomy and bilateral salpingo-oophorectomy. Omentectomy. Mesenteric lymph node biopsy. Specimen Integrity: Intact. Tumor Site: Left ovary. Ovarian Surface Involvement (required only if applicable): Present. Fallopian Tube Surface Involvement (required only if applicable): Present, bilateral. Tumor Size: 2.5 cm. Histologic Type: High grade serous carcinoma. Histologic Grade: High grade. Implants (required for advanced stage serous/seromucinous borderline tumors only): N/A. Other Tissue/ Organ Involvement: Bilateral fallopian tubes, right ovary, uterine serosa, omentum. Largest Extrapelvic Peritoneal Focus (required only if applicable): Microscopic, estimated 0.5 cm (omentum). Peritoneal/Ascitic Fluid: Prior Positive ([id number]). Treatment Effect (required only for high-grade serous carcinomas): Present in  left ovary. CRS2. Regional Lymph Nodes: No lymph nodes submitted/identified. Pathologic Stage Classification (pTNM, AJCC 8th Edition): ypT3b, ypNX Representative Tumor Block: 1A, 1B, 55F, 44F. Comment(s): The right ovary has only surface deposits with a large paratubal cyst. The left ovary has intraparenchymal tumor with associated treatment effect. Thus the tumor location is classified as a left ovarian primary.      03/20/2018 Surgery    Procedure(s) Performed:  1. Exploratory laparotomy with total hysterectomy and bilateral salpingo-oophorectomy 2. Infragastic Omentectomy  3. Debulking to <1cm gross residual disease   Surgeon: Mart Piggs, MD  Specimens: Uterus Cervix, Bilateral tubes / ovaries and omentum. Mesenteric nodule.  Operative Findings: Debulked to gross residual disease <1cm; however there is miliary disease in multiple locations including the majority of the abdominal peritoneum (anterior abdominal wall, bilateral gutters), diaphragm (Right>left), majority of small bowel mesentary. Normal appendix. Normal small uterus. Right ovary with a cystic lesion ~3cm, some adhesive disease of right adnexa to rectum/sigmoid. Gross omental disease, which was resected with the omentectomy. Smooth liver surface, but again, diaphragmatic disease noted.         03/20/2018 Genetic Testing    Patient has genetic testing done for ER/PR. Results revealed patient has ER: 90%, PR 0%.       03/31/2018 Tumor Marker    Patient's tumor was tested for the following markers: CA125 Results of the tumor marker test revealed 215.8       Genetic Testing    Patient has genetic testing done for BRCA 1. Results revealed patient has the following: BRCA 1: no loss of expression.       Genetic Testing    Patient has genetic testing done for MMR . Results revealed patient has the following:  MMR: normal      04/21/2018 Imaging    1. No definite findings of residual or recurrent  metastatic disease in the abdomen or pelvis status post interval TAHBSO and omentectomy. Stable minimal thickening in the paracolic gutters without discrete peritoneal nodularity. 2. Trace free fluid in the pelvic cul-de-sac. 3. Stable small dependent right pleural effusion. 4. Subcentimeter pancreatic body cystic lesion is stable to slightly decreased. 5. Aortic Atherosclerosis (ICD10-I70.0).      04/21/2018 Tumor Marker    Patient's tumor was tested for the following markers: CA125 Results of the tumor marker test revealed 187.3       REVIEW OF SYSTEMS:   Constitutional: Denies fevers, chills or abnormal weight loss Eyes: Denies blurriness of vision Ears, nose, mouth, throat, and face: Denies mucositis or sore throat Respiratory: Denies cough, dyspnea or wheezes Cardiovascular: Denies palpitation, chest discomfort or lower extremity swelling Gastrointestinal:  Denies nausea, heartburn or change in bowel habits Lymphatics: Denies new lymphadenopathy or easy bruising Behavioral/Psych: Mood is stable, no new changes  All other systems were reviewed with the patient and are negative.  I have reviewed the past medical history, past  surgical history, social history and family history with the patient and they are unchanged from previous note.  ALLERGIES:  is allergic to codeine.  MEDICATIONS:  Current Outpatient Medications  Medication Sig Dispense Refill  . albuterol (PROAIR HFA) 108 (90 Base) MCG/ACT inhaler Inhale 2 puffs into the lungs every 6 (six) hours as needed for wheezing or shortness of breath. 1 Inhaler 3  . ALPRAZolam (XANAX) 0.25 MG tablet Take 1 tablet (0.25 mg total) by mouth at bedtime as needed for anxiety. 5 tablet 0  . azelastine (ASTELIN) 0.1 % nasal spray Place 2 sprays into both nostrils 2 (two) times daily as needed for allergies.     . Cholecalciferol (VITAMIN D) 2000 units CAPS Take 2,000 Units by mouth daily.    Marland Kitchen dicyclomine (BENTYL) 10 MG capsule Take 1  tab by mouth every morning. May take twice daily as needed. 60 capsule 6  . diltiazem (CARDIZEM CD) 120 MG 24 hr capsule Take 1 capsule (120 mg total) by mouth every evening. 90 capsule 3  . diltiazem (CARDIZEM CD) 180 MG 24 hr capsule Take 1 capsule (180 mg total) by mouth every morning. 90 capsule 3  . diphenhydrAMINE (BENADRYL) 25 mg capsule Take 25 mg by mouth every 6 (six) hours as needed for itching.    . diphenoxylate-atropine (LOMOTIL) 2.5-0.025 MG tablet Take 1 tablet by mouth 4 (four) times daily. (Patient taking differently: Take 1 tablet by mouth 4 (four) times daily as needed for diarrhea or loose stools. ) 360 tablet 1  . DULoxetine (CYMBALTA) 30 MG capsule TAKE 1 CAPSULE(30 MG) BY MOUTH DAILY AT NIGHT. 30 capsule 0  . enoxaparin (LOVENOX) 40 MG/0.4ML injection Inject 0.4 mLs (40 mg total) into the skin daily. 25 Syringe 0  . fluticasone (FLONASE) 50 MCG/ACT nasal spray Place 2 sprays into both nostrils 2 (two) times daily as needed (FOR NASAL CONGESTION.).     Marland Kitchen furosemide (LASIX) 20 MG tablet Take 1 tablet (20 mg total) by mouth daily. 90 tablet 9  . ipratropium-albuterol (DUONEB) 0.5-2.5 (3) MG/3ML SOLN Take 3 mLs by nebulization 3 (three) times daily. (Patient taking differently: Take 3 mLs by nebulization every 6 (six) hours as needed (cough or SOB). As needed) 360 mL   . letrozole (FEMARA) 2.5 MG tablet Take 1 tablet (2.5 mg total) by mouth daily. 30 tablet 3  . levothyroxine (SYNTHROID, LEVOTHROID) 137 MCG tablet Take 137 mcg by mouth daily before breakfast. For thyroid therapy    . lidocaine-prilocaine (EMLA) cream Apply 1 application topically as needed. (Patient taking differently: Apply 1 application topically as needed (port). ) 30 g 6  . mirtazapine (REMERON) 15 MG tablet TAKE 1 TABLET(15 MG) BY MOUTH AT BEDTIME 30 tablet 11  . morphine (MS CONTIN) 15 MG 12 hr tablet Take 1 tablet (15 mg total) by mouth every 12 (twelve) hours. 60 tablet 0  . ondansetron (ZOFRAN) 8 MG tablet  Take 8 mg by mouth every 8 (eight) hours as needed for nausea or vomiting.    . polyvinyl alcohol (LIQUIFILM TEARS) 1.4 % ophthalmic solution 1 drop 2 (two) times daily as needed for dry eyes.    . potassium chloride (K-DUR) 10 MEQ tablet Take 10 mEq by mouth daily.    . Tiotropium Bromide-Olodaterol (STIOLTO RESPIMAT) 2.5-2.5 MCG/ACT AERS Inhale 2 puffs into the lungs daily. (Patient not taking: Reported on 04/02/2018) 1 Inhaler 3   No current facility-administered medications for this visit.     PHYSICAL EXAMINATION: ECOG PERFORMANCE STATUS:  2 - Symptomatic, <50% confined to bed  Vitals:   04/22/18 1027  BP: (!) 104/44  Pulse: 81  Resp: 18  Temp: 98.1 F (36.7 C)  SpO2: 99%   Filed Weights   04/22/18 1027  Weight: 164 lb 1.6 oz (74.4 kg)    GENERAL:alert, no distress and comfortable SKIN: Noted dry skin near her elbows EYES: normal, Conjunctiva are pink and non-injected, sclera clear OROPHARYNX:no exudate, no erythema and lips, buccal mucosa, and tongue normal  NECK: supple, thyroid normal size, non-tender, without nodularity LYMPH:  no palpable lymphadenopathy in the cervical, axillary or inguinal LUNGS: Reduced breath sound on the right lung base with normal breathing effort HEART: regular rate & rhythm and no murmurs and no lower extremity edema ABDOMEN:abdomen soft, non-tender and normal bowel sounds Musculoskeletal:no cyanosis of digits and no clubbing  NEURO: alert & oriented x 3 with fluent speech, no focal motor/sensory deficits  LABORATORY DATA:  I have reviewed the data as listed    Component Value Date/Time   NA 136 04/21/2018 1152   NA 135 (L) 09/24/2017 0945   K 4.2 04/21/2018 1152   K 3.7 09/24/2017 0945   CL 98 04/21/2018 1152   CO2 30 04/21/2018 1152   CO2 30 (H) 09/24/2017 0945   GLUCOSE 86 04/21/2018 1152   GLUCOSE 101 09/24/2017 0945   BUN 15 04/21/2018 1152   BUN 15.3 09/24/2017 0945   CREATININE 0.90 04/21/2018 1152   CREATININE 1.00  03/13/2018 0940   CREATININE 0.8 09/24/2017 0945   CALCIUM 9.3 04/21/2018 1152   CALCIUM 9.1 09/24/2017 0945   PROT 7.4 04/21/2018 1152   PROT 6.6 09/24/2017 0945   ALBUMIN 3.8 04/21/2018 1152   ALBUMIN 3.3 (L) 09/24/2017 0945   AST 14 (L) 04/21/2018 1152   AST 14 03/13/2018 0940   AST 16 09/24/2017 0945   ALT 11 04/21/2018 1152   ALT 7 03/13/2018 0940   ALT 12 09/24/2017 0945   ALKPHOS 54 04/21/2018 1152   ALKPHOS 52 09/24/2017 0945   BILITOT 0.4 04/21/2018 1152   BILITOT 0.3 03/13/2018 0940   BILITOT 1.13 09/24/2017 0945   GFRNONAA 58 (L) 04/21/2018 1152   GFRNONAA 51 (L) 03/13/2018 0940   GFRAA >60 04/21/2018 1152   GFRAA 59 (L) 03/13/2018 0940    No results found for: SPEP, UPEP  Lab Results  Component Value Date   WBC 8.8 04/21/2018   NEUTROABS 5.7 04/21/2018   HGB 11.6 04/21/2018   HCT 33.9 (L) 04/21/2018   MCV 97.5 04/21/2018   PLT 174 04/21/2018      Chemistry      Component Value Date/Time   NA 136 04/21/2018 1152   NA 135 (L) 09/24/2017 0945   K 4.2 04/21/2018 1152   K 3.7 09/24/2017 0945   CL 98 04/21/2018 1152   CO2 30 04/21/2018 1152   CO2 30 (H) 09/24/2017 0945   BUN 15 04/21/2018 1152   BUN 15.3 09/24/2017 0945   CREATININE 0.90 04/21/2018 1152   CREATININE 1.00 03/13/2018 0940   CREATININE 0.8 09/24/2017 0945      Component Value Date/Time   CALCIUM 9.3 04/21/2018 1152   CALCIUM 9.1 09/24/2017 0945   ALKPHOS 54 04/21/2018 1152   ALKPHOS 52 09/24/2017 0945   AST 14 (L) 04/21/2018 1152   AST 14 03/13/2018 0940   AST 16 09/24/2017 0945   ALT 11 04/21/2018 1152   ALT 7 03/13/2018 0940   ALT 12 09/24/2017 0945   BILITOT 0.4  04/21/2018 1152   BILITOT 0.3 03/13/2018 0940   BILITOT 1.13 09/24/2017 0945       RADIOGRAPHIC STUDIES: I have reviewed multiple imaging study with the patient and her daughter I have personally reviewed the radiological images as listed and agreed with the findings in the report. Dg Chest 2 View  Result Date:  03/31/2018 CLINICAL DATA:  Follow-up right pleural effusion and thoracentesis on 17 March 2018. The patient is reporting cough and dyspnea. History of COPD. EXAM: CHEST - 2 VIEW COMPARISON:  Chest x-ray of March 17, 2018 FINDINGS: A small right pleural effusion has reaccumulated. There is no right-sided pneumothorax. There is minimal linear density at the left lung base laterally which appears new. Elsewhere the left lung is clear. The heart and pulmonary vascularity are normal. There is calcification in the wall of the aortic arch. The power port catheter tip projects over the junction of the middle and distal thirds of the SVC. The bony thorax exhibits no acute abnormality. IMPRESSION: Interval reaccumulation of a small right pleural effusion. New subsegmental atelectasis at the left lung base. No CHF or alveolar pneumonia. Thoracic aortic atherosclerosis. Electronically Signed   By: David  Martinique M.D.   On: 03/31/2018 13:36   Ct Abdomen Pelvis W Contrast  Result Date: 04/21/2018 CLINICAL DATA:  Epithelial left ovarian cancer diagnosed October 2018 status post neoadjuvant chemotherapy and TAHBSO with omentectomy and debulking 03/20/2018. Restaging. EXAM: CT ABDOMEN AND PELVIS WITH CONTRAST TECHNIQUE: Multidetector CT imaging of the abdomen and pelvis was performed using the standard protocol following bolus administration of intravenous contrast. CONTRAST:  119m ISOVUE-300 IOPAMIDOL (ISOVUE-300) INJECTION 61% COMPARISON:  03/07/2018 PET-CT.  02/25/2018 CT abdomen/pelvis. FINDINGS: Lower chest: Small dependent right pleural effusion is stable since 03/07/2018. Hepatobiliary: Normal liver size. No liver mass. Cholecystectomy. Bile ducts are stable and within normal post cholecystectomy limits. CBD diameter 7 mm. Pancreas: Pancreatic body 0.8 cm cystic lesion (series 2/image 14), previously 0.9 cm, stable slightly decreased. No new pancreatic lesions. No pancreatic duct dilation. Spleen: Normal size spleen. A few  scattered subcentimeter hypodense lesions in the spleen are too small to characterize and unchanged. No new splenic lesions. Adrenals/Urinary Tract: Normal adrenals. A few tiny subcentimeter hypodense renal cortical lesions in both kidneys are too small to characterize and are unchanged, considered benign. No new renal lesions. No hydronephrosis. Normal bladder. Stomach/Bowel: Normal non-distended stomach. Normal caliber small bowel with no small bowel wall thickening. Normal appendix. Normal large bowel with no diverticulosis, large bowel wall thickening or pericolonic fat stranding. Vascular/Lymphatic: Atherosclerotic nonaneurysmal abdominal aorta. Patent portal, splenic, hepatic and renal veins. No pathologically enlarged lymph nodes in the abdomen or pelvis. Reproductive: Status post interval hysterectomy and bilateral oophorectomy. No mass or fluid collection at vaginal cuff. No adnexal mass. Other: No pneumoperitoneum. No fluid collections. Trace free fluid in the cul-de-sac. Stable minimal thickening in the paracolic gutters. No discrete recurrent peritoneal nodularity. Musculoskeletal: No aggressive appearing focal osseous lesions. Marked lumbar spondylosis. Stable mild L5 vertebral compression deformity. IMPRESSION: 1. No definite findings of residual or recurrent metastatic disease in the abdomen or pelvis status post interval TAHBSO and omentectomy. Stable minimal thickening in the paracolic gutters without discrete peritoneal nodularity. 2. Trace free fluid in the pelvic cul-de-sac. 3. Stable small dependent right pleural effusion. 4. Subcentimeter pancreatic body cystic lesion is stable to slightly decreased. 5.  Aortic Atherosclerosis (ICD10-I70.0). Electronically Signed   By: JIlona SorrelM.D.   On: 04/21/2018 15:16    All questions were answered. The  patient knows to call the clinic with any problems, questions or concerns. No barriers to learning was detected.  I spent 30 minutes counseling the  patient face to face. The total time spent in the appointment was 40 minutes and more than 50% was on counseling and review of test results  Heath Lark, MD 04/22/2018 3:10 PM

## 2018-04-22 NOTE — Assessment & Plan Note (Signed)
She had recent mild flare of psoriatic arthritis She is requesting refill of her prescription pain medicine which helps with her arthritic pain along with her neuropathic pain I refilled her prescription but advised her to contact her rheumatologist  for further evaluation and management of arthritis

## 2018-04-22 NOTE — Assessment & Plan Note (Signed)
She has residual side effects of neuropathy from prior treatment She will continue Cymbalta

## 2018-04-22 NOTE — Telephone Encounter (Signed)
Gave patient avs and calendar of upcoming aug appts.

## 2018-04-22 NOTE — Assessment & Plan Note (Addendum)
I have reviewed imaging study with the patient and her daughter CT scan showed no frank residual disease Her tumor markers have improved since last visit Based on observation from her GYN oncologist, there is no doubt I suspect the patient might still have residual disease albeit may be at an microscopic level She had significant side effects from recent treatment including residual peripheral neuropathy Given strong ER positivity from her tumor cells, I recommend antiestrogen therapy in the next few months in an attempt to spare her from side effects of chemotherapy, along with close surveillance tumor marker monitoring, history and physical examination I plan to repeat CT imaging in 3 months, due around October 2019 Given her significant cardiovascular risk factors, I am trying to reluctant to prescribe bevacizumab The risks, benefits, side effects from antiestrogen therapy I discussed with the patient and her daughter and she agreed to proceed Plan to see her back within the month for further follow-up and supportive care

## 2018-04-22 NOTE — Assessment & Plan Note (Signed)
She has recurrent of generalized weakness since surgery I recommend outpatient physical therapy and rehabilitation and she agreed

## 2018-04-22 NOTE — Assessment & Plan Note (Signed)
She had mild, persistent pleural effusion but not symptomatic She is no longer using oxygen on a regular basis She has appointment to see pulmonologist and I would defer to them for further management

## 2018-04-23 ENCOUNTER — Ambulatory Visit (INDEPENDENT_AMBULATORY_CARE_PROVIDER_SITE_OTHER): Payer: Medicare Other | Admitting: Adult Health

## 2018-04-23 ENCOUNTER — Encounter: Payer: Self-pay | Admitting: Adult Health

## 2018-04-23 DIAGNOSIS — J90 Pleural effusion, not elsewhere classified: Secondary | ICD-10-CM | POA: Diagnosis not present

## 2018-04-23 DIAGNOSIS — J449 Chronic obstructive pulmonary disease, unspecified: Secondary | ICD-10-CM

## 2018-04-23 NOTE — Patient Instructions (Signed)
Continue on Stiolto 2 puffs daily , rinse after use .  Follow up with Dr. Lamonte Sakai  In 4-6 weeks with chest xray .  Please contact office for sooner follow up if symptoms do not improve or worsen or seek emergency care

## 2018-04-23 NOTE — Progress Notes (Signed)
_0  ID: [name], female    DOB: 11-26-1932, 84 y.o.   MRN: [id number]  Chief Complaint  Patient presents with  . Follow-up    COPD    Referring provider: Asencion Noble, MD  HPI: 84 year old female former smoker followed for COPD (FEV1 69%) , chronic rhinitis and chronic cough.  Past medical history significant for ovarian cancer undergoing active treatment Psoriatic arthritis (previously on methotrexate) A. fib and diastolic heart failure on Xarelto GIB in May 2019   04/23/2018 Follow up: COPD , Pleural Effusion  Patient presents for a 3-week follow-up.  Patient has moderate COPD.  Is on Stiolto.  Says that she is doing well with her breathing.  Does get short of breath with heavy activity.  She denies any increased cough or wheezing.  Denies any hemoptysis.  Patient has known underlying ovarian cancer.  She has been undergoing  chemo which she has finished.  Underwent a TAH and abdominal resection on March 20, 2018.  Says that she did exceptionally well.  Patient has had a recurrent right pleural effusion.Had right thoracentesis on March 17, 2018 cytology was negative for any evidence of ovarian cancer.  She underwent a TAH and abdominal resection on June 13. CT abdomen and pelvis done on July 15 showed no definite findings of residual or recurrent metastatic disease.  Stable small dependent right pleural effusion.  Subcentimeter pancreatic body cystic lesion is stable to slightly decreased She denies any increased shortness of breath.  No orthopnea.  Starting physical therapy soon..  Allergies  Allergen Reactions  . Codeine Itching    Immunization History  Administered Date(s) Administered  . Influenza Split 07/09/2011, 08/19/2013, 06/26/2014  . Influenza Whole 07/08/2009, 07/08/2010, 07/10/2012  . Influenza, High Dose Seasonal PF 07/09/2017  . Influenza,inj,Quad PF,6+ Mos 07/09/2015, 07/07/2016  . Pneumococcal Conjugate-13 07/26/2014  . Pneumococcal  Polysaccharide-23 07/08/2008    Past Medical History:  Diagnosis Date  . Anxiety   . Chronic blood loss anemia    03-04-2018 diverticular bleed and rectal bleeding,  transfused 2 units PRBCs 03-08-2018  . COPD (chronic obstructive pulmonary disease) ([place])    pulmologist-  dr byrum  . Depression   . Diastolic CHF, chronic ([place])    followed by cardiology  . Diverticulosis   . Dyspnea on effort   . Family history of colon cancer   . Family hx of colon cancer   . Fibromyalgia   . Genetic testing 04/07/2018   MyRisk (35 genes) @ [facility] - No pathogenic mutations detected  . GERD (gastroesophageal reflux disease)    03-17-2018 per pt has not had any gerd issues in long time and no meds prn  . Hemorrhoids   . Hiatal hernia   . History of lower GI bleeding 03/04/2018   admission--  dx diverticular bleed and rectal bleed in setting of xarelto-- xarelto stopped and transfused 03-08-2018 2 units PRBCs  . History of rectal polyps   . History of shingles   . Hypothyroidism   . IBS (irritable bowel syndrome)   . Lymphocytic colitis    FOLLOWED BY DR Henrene Pastor  . Malignant ascites    dx at admission 09/ 2018 abdominal s/p  parencentesis 07-01-2017 2.5L,  07-08-2016  2.7L,  07-12-2017  1490m  . Neuropathy due to chemotherapeutic drug ([place]   . Osteoporosis   . Ovarian cancer ([facility] oncologist-  [place]/  dr pGerarda Fraction  chemotherapy 07-19-2017 to 11-04-2017  . Paroxysmal atrial fibrillation ([facility])    CARDIOLOGIST-  DR S. MCDOWELL-  first dx'd 07/ 2014---was taking xarelto up until 03-07-2018 stopped due to lower GI bleed  . Pleural effusion    s/p  right thoracentesis, 02-2018 1.3L and 03-17-2018 right thoracentesis 667m , post cxr no residual effusion  . Psoriatic arthritis ([facility]   . Schatzki's ring    S/P  DILATERAL 2013  . Seasonal allergic rhinitis     Tobacco History: Social History   Tobacco Use  Smoking Status Former Smoker  . Packs/day: 1.00  . Years: 20.00  . Pack years: 20.00    . Types: Cigarettes  . Last attempt to quit: 10/08/1980  . Years since quitting: 37.5  Smokeless Tobacco Never Used   Counseling given: Not Answered   Outpatient Medications Prior to Visit  Medication Sig Dispense Refill  . albuterol (PROAIR HFA) 108 (90 Base) MCG/ACT inhaler Inhale 2 puffs into the lungs every 6 (six) hours as needed for wheezing or shortness of breath. 1 Inhaler 3  . ALPRAZolam (XANAX) 0.25 MG tablet Take 1 tablet (0.25 mg total) by mouth at bedtime as needed for anxiety. 5 tablet 0  . azelastine (ASTELIN) 0.1 % nasal spray Place 2 sprays into both nostrils 2 (two) times daily as needed for allergies.     . Cholecalciferol (VITAMIN D) 2000 units CAPS Take 2,000 Units by mouth daily.    .Marland Kitchendicyclomine (BENTYL) 10 MG capsule Take 1 tab by mouth every morning. May take twice daily as needed. 60 capsule 6  . diltiazem (CARDIZEM CD) 120 MG 24 hr capsule Take 1 capsule (120 mg total) by mouth every evening. 90 capsule 3  . diltiazem (CARDIZEM CD) 180 MG 24 hr capsule Take 1 capsule (180 mg total) by mouth every morning. 90 capsule 3  . diphenhydrAMINE (BENADRYL) 25 mg capsule Take 25 mg by mouth every 6 (six) hours as needed for itching.    . diphenoxylate-atropine (LOMOTIL) 2.5-0.025 MG tablet Take 1 tablet by mouth 4 (four) times daily. (Patient taking differently: Take 1 tablet by mouth 4 (four) times daily as needed for diarrhea or loose stools. ) 360 tablet 1  . DULoxetine (CYMBALTA) 30 MG capsule TAKE 1 CAPSULE(30 MG) BY MOUTH DAILY AT NIGHT. 30 capsule 0  . fluticasone (FLONASE) 50 MCG/ACT nasal spray Place 2 sprays into both nostrils 2 (two) times daily as needed (FOR NASAL CONGESTION.).     .Marland Kitchenfurosemide (LASIX) 20 MG tablet Take 1 tablet (20 mg total) by mouth daily. 90 tablet 9  . ipratropium-albuterol (DUONEB) 0.5-2.5 (3) MG/3ML SOLN Take 3 mLs by nebulization 3 (three) times daily. (Patient taking differently: Take 3 mLs by nebulization every 6 (six) hours as needed  (cough or SOB). As needed) 360 mL   . letrozole (FEMARA) 2.5 MG tablet Take 1 tablet (2.5 mg total) by mouth daily. 30 tablet 3  . levothyroxine (SYNTHROID, LEVOTHROID) 137 MCG tablet Take 137 mcg by mouth daily before breakfast. For thyroid therapy    . lidocaine-prilocaine (EMLA) cream Apply 1 application topically as needed. (Patient taking differently: Apply 1 application topically as needed (port). ) 30 g 6  . mirtazapine (REMERON) 15 MG tablet TAKE 1 TABLET(15 MG) BY MOUTH AT BEDTIME 30 tablet 11  . morphine (MS CONTIN) 15 MG 12 hr tablet Take 1 tablet (15 mg total) by mouth every 12 (twelve) hours. 60 tablet 0  . ondansetron (ZOFRAN) 8 MG tablet Take 8 mg by mouth every 8 (eight) hours as needed for nausea or vomiting.    .Marland Kitchen  polyvinyl alcohol (LIQUIFILM TEARS) 1.4 % ophthalmic solution 1 drop 2 (two) times daily as needed for dry eyes.    . potassium chloride (K-DUR) 10 MEQ tablet Take 10 mEq by mouth daily.    . rivaroxaban (XARELTO) 20 MG TABS tablet Take 20 mg by mouth daily with supper.    . Tiotropium Bromide-Olodaterol (STIOLTO RESPIMAT) 2.5-2.5 MCG/ACT AERS Inhale 2 puffs into the lungs daily. 1 Inhaler 3  . enoxaparin (LOVENOX) 40 MG/0.4ML injection Inject 0.4 mLs (40 mg total) into the skin daily. (Patient not taking: Reported on 04/23/2018) 25 Syringe 0   No facility-administered medications prior to visit.      Review of Systems  Constitutional:   No  weight loss, night sweats,  Fevers, chills,  +fatigue, or  lassitude.  HEENT:   No headaches,  Difficulty swallowing,  Tooth/dental problems, or  Sore throat,                No sneezing, itching, ear ache, nasal congestion, post nasal drip,   CV:  No chest pain,  Orthopnea, PND, swelling in lower extremities, anasarca, dizziness, palpitations, syncope.   GI  No heartburn, indigestion, abdominal pain, nausea, vomiting, diarrhea, change in bowel habits, loss of appetite, bloody stools.   Resp:    No excess mucus, no productive  cough,  No non-productive cough,  No coughing up of blood.  No change in color of mucus.  No wheezing.  No chest wall deformity  Skin: no rash or lesions.  GU: no dysuria, change in color of urine, no urgency or frequency.  No flank pain, no hematuria   MS:  No joint pain or swelling.  No decreased range of motion.  No back pain.    Physical Exam  BP 114/60 (BP Location: Left Arm, Cuff Size: Normal)   Pulse 61   Ht _0  (1.626 m)   Wt 164 lb 12.8 oz (74.8 kg)   SpO2 97%   BMI 28.29 kg/m   GEN: A/Ox3; pleasant , NAD, elderly in wc    HEENT:  [place]/AT,  EACs-clear, TMs-wnl, NOSE-clear, THROAT-clear, no lesions, no postnasal drip or exudate noted.   NECK:  Supple w/ fair ROM; no JVD; normal carotid impulses w/o bruits; no thyromegaly or nodules palpated; no lymphadenopathy.    RESP  Clear  P & A; w/o, wheezes/ rales/ or rhonchi. no accessory muscle use, no dullness to percussion  CARD:  RRR, no m/r/g, no peripheral edema, pulses intact, no cyanosis or clubbing.  GI:   Soft & nt; nml bowel sounds; no organomegaly or masses detected.   Musco: Warm bil, no deformities or joint swelling noted.   Neuro: alert, no focal deficits noted.    Skin: Warm, no lesions or rashes    Lab Results:  CBC    Component Value Date/Time   WBC 8.8 04/21/2018 1152   RBC 3.48 (L) 04/21/2018 1152   HGB 11.6 04/21/2018 1152   HGB 10.1 (L) 03/13/2018 0940   HGB 10.8 (L) 09/24/2017 0945   HCT 33.9 (L) 04/21/2018 1152   HCT 32.8 (L) 09/24/2017 0945   PLT 174 04/21/2018 1152   PLT 163 03/13/2018 0940   PLT 174 09/24/2017 0945   MCV 97.5 04/21/2018 1152   MCV 93.2 09/24/2017 0945   MCH 33.5 04/21/2018 1152   MCHC 34.3 04/21/2018 1152   RDW 12.8 04/21/2018 1152   RDW 18.7 (H) 09/24/2017 0945   LYMPHSABS 1.6 04/21/2018 1152   LYMPHSABS 0.9 09/24/2017 0945  MONOABS 0.8 04/21/2018 1152   MONOABS 0.1 09/24/2017 0945   EOSABS 0.6 (H) 04/21/2018 1152   EOSABS 0.1 09/24/2017 0945   BASOSABS 0.1  04/21/2018 1152   BASOSABS 0.0 09/24/2017 0945    BMET    Component Value Date/Time   NA 136 04/21/2018 1152   NA 135 (L) 09/24/2017 0945   K 4.2 04/21/2018 1152   K 3.7 09/24/2017 0945   CL 98 04/21/2018 1152   CO2 30 04/21/2018 1152   CO2 30 (H) 09/24/2017 0945   GLUCOSE 86 04/21/2018 1152   GLUCOSE 101 09/24/2017 0945   BUN 15 04/21/2018 1152   BUN 15.3 09/24/2017 0945   CREATININE 0.90 04/21/2018 1152   CREATININE 1.00 03/13/2018 0940   CREATININE 0.8 09/24/2017 0945   CALCIUM 9.3 04/21/2018 1152   CALCIUM 9.1 09/24/2017 0945   GFRNONAA 58 (L) 04/21/2018 1152   GFRNONAA 51 (L) 03/13/2018 0940   GFRAA >60 04/21/2018 1152   GFRAA 59 (L) 03/13/2018 0940    BNP    Component Value Date/Time   BNP 76.7 11/12/2017 0102   BNP 51.2 09/05/2015 1118    ProBNP    Component Value Date/Time   PROBNP 837 (H) 08/12/2017 1137    Imaging: Dg Chest 2 View  Result Date: 03/31/2018 CLINICAL DATA:  Follow-up right pleural effusion and thoracentesis on 17 March 2018. The patient is reporting cough and dyspnea. History of COPD. EXAM: CHEST - 2 VIEW COMPARISON:  Chest x-ray of March 17, 2018 FINDINGS: A small right pleural effusion has reaccumulated. There is no right-sided pneumothorax. There is minimal linear density at the left lung base laterally which appears new. Elsewhere the left lung is clear. The heart and pulmonary vascularity are normal. There is calcification in the wall of the aortic arch. The power port catheter tip projects over the junction of the middle and distal thirds of the SVC. The bony thorax exhibits no acute abnormality. IMPRESSION: Interval reaccumulation of a small right pleural effusion. New subsegmental atelectasis at the left lung base. No CHF or alveolar pneumonia. Thoracic aortic atherosclerosis. Electronically Signed   By: David  Martinique M.D.   On: 03/31/2018 13:36   Ct Abdomen Pelvis W Contrast  Result Date: 04/21/2018 CLINICAL DATA:  Epithelial left  ovarian cancer diagnosed October 2018 status post neoadjuvant chemotherapy and TAHBSO with omentectomy and debulking 03/20/2018. Restaging. EXAM: CT ABDOMEN AND PELVIS WITH CONTRAST TECHNIQUE: Multidetector CT imaging of the abdomen and pelvis was performed using the standard protocol following bolus administration of intravenous contrast. CONTRAST:  18m ISOVUE-300 IOPAMIDOL (ISOVUE-300) INJECTION 61% COMPARISON:  03/07/2018 PET-CT.  02/25/2018 CT abdomen/pelvis. FINDINGS: Lower chest: Small dependent right pleural effusion is stable since 03/07/2018. Hepatobiliary: Normal liver size. No liver mass. Cholecystectomy. Bile ducts are stable and within normal post cholecystectomy limits. CBD diameter 7 mm. Pancreas: Pancreatic body 0.8 cm cystic lesion (series 2/image 14), previously 0.9 cm, stable slightly decreased. No new pancreatic lesions. No pancreatic duct dilation. Spleen: Normal size spleen. A few scattered subcentimeter hypodense lesions in the spleen are too small to characterize and unchanged. No new splenic lesions. Adrenals/Urinary Tract: Normal adrenals. A few tiny subcentimeter hypodense renal cortical lesions in both kidneys are too small to characterize and are unchanged, considered benign. No new renal lesions. No hydronephrosis. Normal bladder. Stomach/Bowel: Normal non-distended stomach. Normal caliber small bowel with no small bowel wall thickening. Normal appendix. Normal large bowel with no diverticulosis, large bowel wall thickening or pericolonic fat stranding. Vascular/Lymphatic: Atherosclerotic nonaneurysmal abdominal aorta.  Patent portal, splenic, hepatic and renal veins. No pathologically enlarged lymph nodes in the abdomen or pelvis. Reproductive: Status post interval hysterectomy and bilateral oophorectomy. No mass or fluid collection at vaginal cuff. No adnexal mass. Other: No pneumoperitoneum. No fluid collections. Trace free fluid in the cul-de-sac. Stable minimal thickening in the  paracolic gutters. No discrete recurrent peritoneal nodularity. Musculoskeletal: No aggressive appearing focal osseous lesions. Marked lumbar spondylosis. Stable mild L5 vertebral compression deformity. IMPRESSION: 1. No definite findings of residual or recurrent metastatic disease in the abdomen or pelvis status post interval TAHBSO and omentectomy. Stable minimal thickening in the paracolic gutters without discrete peritoneal nodularity. 2. Trace free fluid in the pelvic cul-de-sac. 3. Stable small dependent right pleural effusion. 4. Subcentimeter pancreatic body cystic lesion is stable to slightly decreased. 5.  Aortic Atherosclerosis (ICD10-I70.0). Electronically Signed   By: Ilona Sorrel M.D.   On: 04/21/2018 15:16     Assessment & Plan:   Pleural effusion Recurrent right pleural effusion status post thoracentesis x 2 .  PET scan No findings for metastatic disease involving the chest. Pleural fluid cytology neg for malignant cells 03/2017 .  Patient does have a small reaccumulation in the right pleural space.  She is clinically asymptomatic. Would like to hold off on thoracentesis at this time.  Bring her back in 4 to 6 weeks with a follow-up chest x-ray.  Patient is advised if symptoms of shortness of breath orthopnea develop that she is to call her office for sooner follow-up.   COPD (chronic obstructive pulmonary disease) (HCC) Moderate COPD doing well on Stiolto Continue on current regimen. Physical therapy for deconditioning is a great idea  Plan  Patient Instructions  Continue on Stiolto 2 puffs daily , rinse after use .  Follow up with Dr. Lamonte Sakai  In 4-6 weeks with chest xray .  Please contact office for sooner follow up if symptoms do not improve or worsen or seek emergency care          Rexene Edison, NP 04/23/2018

## 2018-04-23 NOTE — Assessment & Plan Note (Signed)
Recurrent right pleural effusion status post thoracentesis x 2 .  PET scan No findings for metastatic disease involving the chest. Pleural fluid cytology neg for malignant cells 03/2017 .  Patient does have a small reaccumulation in the right pleural space.  She is clinically asymptomatic. Would like to hold off on thoracentesis at this time.  Bring her back in 4 to 6 weeks with a follow-up chest x-ray.  Patient is advised if symptoms of shortness of breath orthopnea develop that she is to call her office for sooner follow-up.

## 2018-04-23 NOTE — Assessment & Plan Note (Signed)
Moderate COPD doing well on Stiolto Continue on current regimen. Physical therapy for deconditioning is a great idea  Plan  Patient Instructions  Continue on Stiolto 2 puffs daily , rinse after use .  Follow up with Dr. Lamonte Sakai  In 4-6 weeks with chest xray .  Please contact office for sooner follow up if symptoms do not improve or worsen or seek emergency care

## 2018-04-28 MED FILL — MORPHINE SULF ER 15 MG TAB: 15 | 30 days supply | Qty: 60 | Fill #0

## 2018-04-29 ENCOUNTER — Telehealth: Payer: Self-pay | Admitting: Hematology and Oncology

## 2018-04-29 ENCOUNTER — Telehealth: Payer: Self-pay

## 2018-04-29 ENCOUNTER — Other Ambulatory Visit: Payer: Self-pay | Admitting: Hematology and Oncology

## 2018-04-29 NOTE — Telephone Encounter (Signed)
I spoke with her rheumatologist in regards to using Biologics to treat her psoriatic arthritis I believe the benefits would outweigh the risk of treatment.

## 2018-04-29 NOTE — Telephone Encounter (Signed)
Dr. Leigh Aurora at [facility] called and left a message. He would like to speak with Dr. Alvy Bimler regarding her care. The number to call back is [phone] at [place].

## 2018-05-02 ENCOUNTER — Ambulatory Visit (INDEPENDENT_AMBULATORY_CARE_PROVIDER_SITE_OTHER)
Admission: RE | Admit: 2018-05-02 | Discharge: 2018-05-02 | Disposition: A | Discharge status: Home / Self Care | Payer: Medicare Other | Source: Ambulatory Visit | Attending: Pulmonary Disease | Admitting: Pulmonary Disease

## 2018-05-02 ENCOUNTER — Ambulatory Visit (INDEPENDENT_AMBULATORY_CARE_PROVIDER_SITE_OTHER): Payer: Medicare Other | Admitting: Pulmonary Disease

## 2018-05-02 ENCOUNTER — Encounter: Payer: Self-pay | Admitting: Pulmonary Disease

## 2018-05-02 VITALS — BP 118/62 | HR 76 | Ht 64.0 in | Wt 167.2 lb

## 2018-05-02 DIAGNOSIS — J90 Pleural effusion, not elsewhere classified: Secondary | ICD-10-CM | POA: Diagnosis not present

## 2018-05-02 DIAGNOSIS — J449 Chronic obstructive pulmonary disease, unspecified: Secondary | ICD-10-CM

## 2018-05-02 NOTE — Assessment & Plan Note (Signed)
Continue Stiolto Continue rescue inhaler Continue neb treatments Chest x-ray today

## 2018-05-02 NOTE — Progress Notes (Signed)
_0  ID: [name], female    DOB: [birth year], 84 y.o.   MRN: [id number]  Chief Complaint  Patient presents with  . Acute Visit    productive cough at times, yellow mucus, using O2 daily for past week    Referring provider: Asencion Noble, MD  HPI: 84 year old female former smoker followed for COPD (FEV1 69%) , chronic rhinitis and chronic cough.  Past medical history significant for ovarian cancer undergoing active treatment Psoriatic arthritis (previously on methotrexate) A. fib and diastolic heart failure on Xarelto GIB in May 2019   Patient of Dr. Lamonte Sakai  Recent [facility] Encounters:   04/23/2018 Follow up: COPD , Pleural Effusion  Patient presents for a 3-week follow-up.  Patient has moderate COPD.  Is on Stiolto.  Says that she is doing well with her breathing.  Does get short of breath with heavy activity.  She denies any increased cough or wheezing.  Denies any hemoptysis. Patient has known underlying ovarian cancer.  She has been undergoing  chemo which she has finished.  Underwent a TAH and abdominal resection on March 20, 2018.  Says that she did exceptionally well. Patient has had a recurrent right pleural effusion.Had right thoracentesis on March 17, 2018 cytology was negative for any evidence of ovarian cancer.  She underwent a TAH and abdominal resection on June 13. CT abdomen and pelvis done on July 15 showed no definite findings of residual or recurrent metastatic disease.  Stable small dependent right pleural effusion.  Subcentimeter pancreatic body cystic lesion is stable to slightly decreased She denies any increased shortness of breath.  No orthopnea. Plan: Hold on thoracentesis at this time, follow-up with our office if symptoms begin, follow-up in 4 to 6 weeks with chest x-ray, continue Stiolto     05/02/18 Acute   84 year old patient seen office today for increased shortness of breath, increased need for oxygen, and worsening shortness of breath  when laying flat.  Patient reports that she is now needing to use 2 L of oxygen continuous to maintain oxygen saturations.  Patient reports that on exertion on room air sats are between 83 and 88.  Patient is satting 98% on 2 L in office today.  Patient denies other respiratory complaints such as hemoptysis, fevers, chest tightness.  Daughter endorses the fact that the patient does occasionally have a wheeze.  Patient is concerned that she potentially is having a COPD exacerbation or that her right pleural effusion is worsening.  Patient is accompanied by daughter today reporting that they are going on vacation in 2 weeks and want to make sure that she is stable.  Allergies  Allergen Reactions  . Codeine Itching    Immunization History  Administered Date(s) Administered  . Influenza Split 07/09/2011, 08/19/2013, 06/26/2014  . Influenza Whole 07/08/2009, 07/08/2010, 07/10/2012  . Influenza, High Dose Seasonal PF 07/09/2017  . Influenza,inj,Quad PF,6+ Mos 07/09/2015, 07/07/2016  . Pneumococcal Conjugate-13 07/26/2014  . Pneumococcal Polysaccharide-23 07/08/2008    Past Medical History:  Diagnosis Date  . Anxiety   . Chronic blood loss anemia    03-04-2018 diverticular bleed and rectal bleeding,  transfused 2 units PRBCs 03-08-2018  . COPD (chronic obstructive pulmonary disease) ([place])    pulmologist-  dr byrum  . Depression   . Diastolic CHF, chronic ([place])    followed by cardiology  . Diverticulosis   . Dyspnea on effort   . Family history of colon cancer   . Family hx of colon cancer   .  Fibromyalgia   . Genetic testing 04/07/2018   MyRisk (35 genes) @ [facility] - No pathogenic mutations detected  . GERD (gastroesophageal reflux disease)    03-17-2018 per pt has not had any gerd issues in long time and no meds prn  . Hemorrhoids   . Hiatal hernia   . History of lower GI bleeding 03/04/2018   admission--  dx diverticular bleed and rectal bleed in setting of xarelto-- xarelto  stopped and transfused 03-08-2018 2 units PRBCs  . History of rectal polyps   . History of shingles   . Hypothyroidism   . IBS (irritable bowel syndrome)   . Lymphocytic colitis    FOLLOWED BY DR Henrene Pastor  . Malignant ascites    dx at admission 09/ 2018 abdominal s/p  parencentesis 07-01-2017 2.5L,  07-08-2016  2.7L,  07-12-2017  1457m  . [place] chemotherapeutic drug ([place]   . Osteoporosis   . Ovarian cancer ([facility] oncologist-  [place]/  dr pGerarda Fraction  chemotherapy 07-19-2017 to 11-04-2017  . [place]ox[place]    CARDIOLOGIST-  DR S. MCDOWELL-  first dx'd 07/ 2014---was taking xarelto up until 03-07-2018 stopped due to lower GI bleed  . Pleural effusion    s/p  right thoracentesis, 02-2018 1.3L and 03-17-2018 right thoracentesis 6449m, post cxr no residual effusion  . Psoriatic arthritis ([facility]  . Schatzki's ring    S/P  DILATERAL 2013  . Seasonal allergic rhinitis     Tobacco History: Social History   Tobacco Use  Smoking Status Former Smoker  . Packs/day: 1.00  . Years: 20.00  . Pack years: 20.00  . Types: Cigarettes  . Last attempt to quit: 10/08/1980  . Years since quitting: 37.5  Smokeless Tobacco Never Used   Counseling given: Yes Continue to not smoke  Outpatient Encounter Medications as of 05/02/2018  Medication Sig  . albuterol (PROAIR HFA) 108 (90 Base) MCG/ACT inhaler Inhale 2 puffs into the lungs every 6 (six) hours as needed for wheezing or shortness of breath.  . ALPRAZolam (XANAX) 0.25 MG tablet Take 1 tablet (0.25 mg total) by mouth at bedtime as needed for anxiety.  . Marland Kitchenzelastine (ASTELIN) 0.1 % nasal spray Place 2 sprays into both nostrils 2 (two) times daily as needed for allergies.   . Cholecalciferol (VITAMIN D) 2000 units CAPS Take 2,000 Units by mouth daily.  . Marland Kitchenicyclomine (BENTYL) 10 MG capsule Take 1 tab by mouth every morning. May take twice daily as needed.  . diltiazem (CARDIZEM CD) 120 MG 24 hr capsule Take 1 capsule (120  mg total) by mouth every evening.  . diltiazem (CARDIZEM CD) 180 MG 24 hr capsule Take 1 capsule (180 mg total) by mouth every morning.  . diphenhydrAMINE (BENADRYL) 25 mg capsule Take 25 mg by mouth every 6 (six) hours as needed for itching.  . diphenoxylate-atropine (LOMOTIL) 2.5-0.025 MG tablet Take 1 tablet by mouth 4 (four) times daily. (Patient taking differently: Take 1 tablet by mouth 4 (four) times daily as needed for diarrhea or loose stools. )  . DULoxetine (CYMBALTA) 30 MG capsule TAKE 1 CAPSULE(30 MG) BY MOUTH DAILY AT NIGHT  . fluticasone (FLONASE) 50 MCG/ACT nasal spray Place 2 sprays into both nostrils 2 (two) times daily as needed (FOR NASAL CONGESTION.).   . Marland Kitchenurosemide (LASIX) 20 MG tablet Take 1 tablet (20 mg total) by mouth daily.  . Marland Kitchenpratropium-albuterol (DUONEB) 0.5-2.5 (3) MG/3ML SOLN Take 3 mLs by nebulization 3 (three) times daily. (Patient taking  differently: Take 3 mLs by nebulization every 6 (six) hours as needed (cough or SOB). As needed)  . letrozole (FEMARA) 2.5 MG tablet Take 1 tablet (2.5 mg total) by mouth daily.  Marland Kitchen levothyroxine (SYNTHROID, LEVOTHROID) 137 MCG tablet Take 137 mcg by mouth daily before breakfast. For thyroid therapy  . lidocaine-prilocaine (EMLA) cream Apply 1 application topically as needed. (Patient taking differently: Apply 1 application topically as needed (port). )  . mirtazapine (REMERON) 15 MG tablet TAKE 1 TABLET(15 MG) BY MOUTH AT BEDTIME  . morphine (MS CONTIN) 15 MG 12 hr tablet Take 1 tablet (15 mg total) by mouth every 12 (twelve) hours.  . ondansetron (ZOFRAN) 8 MG tablet Take 8 mg by mouth every 8 (eight) hours as needed for nausea or vomiting.  . polyvinyl alcohol (LIQUIFILM TEARS) 1.4 % ophthalmic solution 1 drop 2 (two) times daily as needed for dry eyes.  . potassium chloride (K-DUR) 10 MEQ tablet Take 10 mEq by mouth daily.  . rivaroxaban (XARELTO) 20 MG TABS tablet Take 20 mg by mouth daily with supper.  . Tiotropium  Bromide-Olodaterol (STIOLTO RESPIMAT) 2.5-2.5 MCG/ACT AERS Inhale 2 puffs into the lungs daily.  . [DISCONTINUED] Calcium Carbonate (CALCIUM 600) 1500 MG TABS Take 2 tablets by mouth daily.     No facility-administered encounter medications on file as of 05/02/2018.     Review of Systems  Review of Systems  Constitutional: Positive for fatigue. Negative for chills, fever and unexpected weight change.  HENT: Negative for congestion, ear pain, postnasal drip, rhinorrhea, sinus pressure, sinus pain, sneezing and sore throat.   Respiratory: Positive for cough, shortness of breath (especially when lying flat) and wheezing. Negative for chest tightness.   Cardiovascular: Negative for chest pain and palpitations.  Gastrointestinal: Negative for blood in stool, diarrhea, nausea and vomiting.  Genitourinary: Negative for dysuria, frequency and urgency.  Musculoskeletal: Negative for arthralgias.  Skin: Negative for color change.  Allergic/Immunologic: Negative for environmental allergies and food allergies.  Neurological: Negative for dizziness, light-headedness and headaches.  Psychiatric/Behavioral: Negative for dysphoric mood. The patient is not nervous/anxious.       Physical Exam  BP 118/62 (BP Location: Right Arm, Cuff Size: Normal)   Pulse 76   Ht _0  (1.626 m)   Wt 167 lb 3.2 oz (75.8 kg)   SpO2 99%   BMI 28.70 kg/m   Wt Readings from Last 5 Encounters:  05/02/18 167 lb 3.2 oz (75.8 kg)  04/23/18 164 lb 12.8 oz (74.8 kg)  04/22/18 164 lb 1.6 oz (74.4 kg)  04/02/18 163 lb 3.2 oz (74 kg)  04/01/18 164 lb (74.4 kg)     Physical Exam  Constitutional: She is oriented to person, place, and time and well-developed, well-nourished, and in no distress. No distress.  HENT:  Head: Normocephalic and atraumatic.  Right Ear: Hearing, tympanic membrane, external ear and ear canal normal.  Left Ear: Hearing, tympanic membrane, external ear and ear canal normal.  Nose: Nose normal.  Right sinus exhibits no maxillary sinus tenderness and no frontal sinus tenderness. Left sinus exhibits no maxillary sinus tenderness and no frontal sinus tenderness.  Mouth/Throat: Uvula is midline and oropharynx is clear and moist. No oropharyngeal exudate.  Eyes: Pupils are equal, round, and reactive to light.  Neck: Normal range of motion. Neck supple. No JVD present.  Cardiovascular: Normal rate, regular rhythm and normal heart sounds.  Pulmonary/Chest: Effort normal. No accessory muscle usage. No respiratory distress. She has decreased breath sounds in the right  middle field and the right lower field. She has no wheezes. She has no rhonchi.  Abdominal: Soft. Bowel sounds are normal. There is no tenderness.  Musculoskeletal: Normal range of motion. She exhibits no edema.  In wheelchair   Lymphadenopathy:    She has no cervical adenopathy.  Neurological: She is alert and oriented to person, place, and time.  Skin: Skin is warm and dry. She is not diaphoretic. No erythema.  Psychiatric: Mood, memory, affect and judgment normal.  Nursing note and vitals reviewed.    Lab Results:  CBC    Component Value Date/Time   WBC 8.8 04/21/2018 1152   RBC 3.48 (L) 04/21/2018 1152   HGB 11.6 04/21/2018 1152   HGB 10.1 (L) 03/13/2018 0940   HGB 10.8 (L) 09/24/2017 0945   HCT 33.9 (L) 04/21/2018 1152   HCT 32.8 (L) 09/24/2017 0945   PLT 174 04/21/2018 1152   PLT 163 03/13/2018 0940   PLT 174 09/24/2017 0945   MCV 97.5 04/21/2018 1152   MCV 93.2 09/24/2017 0945   MCH 33.5 04/21/2018 1152   MCHC 34.3 04/21/2018 1152   RDW 12.8 04/21/2018 1152   RDW 18.7 (H) 09/24/2017 0945   LYMPHSABS 1.6 04/21/2018 1152   LYMPHSABS 0.9 09/24/2017 0945   MONOABS 0.8 04/21/2018 1152   MONOABS 0.1 09/24/2017 0945   EOSABS 0.6 (H) 04/21/2018 1152   EOSABS 0.1 09/24/2017 0945   BASOSABS 0.1 04/21/2018 1152   BASOSABS 0.0 09/24/2017 0945    BMET    Component Value Date/Time   NA 136 04/21/2018 1152    NA 135 (L) 09/24/2017 0945   K 4.2 04/21/2018 1152   K 3.7 09/24/2017 0945   CL 98 04/21/2018 1152   CO2 30 04/21/2018 1152   CO2 30 (H) 09/24/2017 0945   GLUCOSE 86 04/21/2018 1152   GLUCOSE 101 09/24/2017 0945   BUN 15 04/21/2018 1152   BUN 15.3 09/24/2017 0945   CREATININE 0.90 04/21/2018 1152   CREATININE 1.00 03/13/2018 0940   CREATININE 0.8 09/24/2017 0945   CALCIUM 9.3 04/21/2018 1152   CALCIUM 9.1 09/24/2017 0945   GFRNONAA 58 (L) 04/21/2018 1152   GFRNONAA 51 (L) 03/13/2018 0940   GFRAA >60 04/21/2018 1152   GFRAA 59 (L) 03/13/2018 0940    BNP    Component Value Date/Time   BNP 76.7 11/12/2017 0102   BNP 51.2 09/05/2015 1118    ProBNP    Component Value Date/Time   PROBNP 837 (H) 08/12/2017 1137    Imaging: Dg Chest 2 View  Result Date: 05/02/2018 CLINICAL DATA:  Shortness of breath and cough. Right pleural effusion. EXAM: CHEST - 2 VIEW COMPARISON:  Chest x-ray dated March 31, 2018. FINDINGS: Unchanged right chest wall port catheter with the tip at the cavoatrial junction. The heart size and mediastinal contours are within normal limits. Normal pulmonary vascularity. Atherosclerotic calcification of the aortic arch. The lungs remain hyperinflated with emphysematous changes. Slight interval increase in size of the small right pleural effusion with adjacent right basilar atelectasis. The left lung is clear. No pneumothorax. No acute osseous abnormality. IMPRESSION: 1. Slight interval increase in size of small right pleural effusion. Electronically Signed   By: Titus Dubin M.D.   On: 05/02/2018 16:52   Ct Abdomen Pelvis W Contrast  Result Date: 04/21/2018 CLINICAL DATA:  Epithelial left ovarian cancer diagnosed October 2018 status post neoadjuvant chemotherapy and TAHBSO with omentectomy and debulking 03/20/2018. Restaging. EXAM: CT ABDOMEN AND PELVIS WITH CONTRAST TECHNIQUE: Multidetector CT  imaging of the abdomen and pelvis was performed using the standard  protocol following bolus administration of intravenous contrast. CONTRAST:  122m ISOVUE-300 IOPAMIDOL (ISOVUE-300) INJECTION 61% COMPARISON:  03/07/2018 PET-CT.  02/25/2018 CT abdomen/pelvis. FINDINGS: Lower chest: Small dependent right pleural effusion is stable since 03/07/2018. Hepatobiliary: Normal liver size. No liver mass. Cholecystectomy. Bile ducts are stable and within normal post cholecystectomy limits. CBD diameter 7 mm. Pancreas: Pancreatic body 0.8 cm cystic lesion (series 2/image 14), previously 0.9 cm, stable slightly decreased. No new pancreatic lesions. No pancreatic duct dilation. Spleen: Normal size spleen. A few scattered subcentimeter hypodense lesions in the spleen are too small to characterize and unchanged. No new splenic lesions. Adrenals/Urinary Tract: Normal adrenals. A few tiny subcentimeter hypodense renal cortical lesions in both kidneys are too small to characterize and are unchanged, considered benign. No new renal lesions. No hydronephrosis. Normal bladder. Stomach/Bowel: Normal non-distended stomach. Normal caliber small bowel with no small bowel wall thickening. Normal appendix. Normal large bowel with no diverticulosis, large bowel wall thickening or pericolonic fat stranding. Vascular/Lymphatic: Atherosclerotic nonaneurysmal abdominal aorta. Patent portal, splenic, hepatic and renal veins. No pathologically enlarged lymph nodes in the abdomen or pelvis. Reproductive: Status post interval hysterectomy and bilateral oophorectomy. No mass or fluid collection at vaginal cuff. No adnexal mass. Other: No pneumoperitoneum. No fluid collections. Trace free fluid in the cul-de-sac. Stable minimal thickening in the paracolic gutters. No discrete recurrent peritoneal nodularity. Musculoskeletal: No aggressive appearing focal osseous lesions. Marked lumbar spondylosis. Stable mild L5 vertebral compression deformity. IMPRESSION: 1. No definite findings of residual or recurrent metastatic  disease in the abdomen or pelvis status post interval TAHBSO and omentectomy. Stable minimal thickening in the paracolic gutters without discrete peritoneal nodularity. 2. Trace free fluid in the pelvic cul-de-sac. 3. Stable small dependent right pleural effusion. 4. Subcentimeter pancreatic body cystic lesion is stable to slightly decreased. 5.  Aortic Atherosclerosis (ICD10-I70.0). Electronically Signed   By: JIlona SorrelM.D.   On: 04/21/2018 15:16     Assessment & Plan:   Pleasant 84year old patient seen office today.  I do not believe the patient is having a COPD exacerbation.  Will get chest x-ray as I believe that she potentially may have a increased right pleural effusion.  Given patient's respiratory symptoms as well as exam I think reasonable to proceed forward with thoracentesis if this is the case.  Will get stat chest x-ray today.  Explained to patient that we will try to proceed with outpatient management of her shortness of breath.  If shortness of breath worsens, starts coughing up blood, or struggling to breathe and unable to maintain oxygen saturation she needs to present to the emergency room.  Patient and daughter agree.   COPD (chronic obstructive pulmonary disease) (HCC) Continue Stiolto Continue rescue inhaler Continue neb treatments Chest x-ray today  Pleural effusion Will get chest x-ray today We will consider therapeutic and diagnostic thoracentesis if right effusion is increased        BLauraine Rinne NP 05/02/2018

## 2018-05-02 NOTE — Patient Instructions (Signed)
Stat chest x-ray today  We will contact you with these results  Continue Stiolto inhaler Continue nebulized treatments Continue rescue inhaler   Follow-up with our office if symptoms worsen, have issues breathing   Please contact the office if your symptoms worsen or you have concerns that you are not improving.   Thank you for choosing [facility] for your healthcare, and for allowing [place] to partner with you on your healthcare journey. I am thankful to be able to provide care to you today.   Wyn Quaker FNP-C

## 2018-05-02 NOTE — Progress Notes (Signed)
Contacted patient regarding results of chest x-ray.  Given patient's clinical picture as well as her increased oxygen use okay to proceed forward with diagnostic and therapeutic thoracentesis outpatient to be completed next week.  I have contacted the patient as well as her daughter to update them on the status.  They are aware that they will need to complete this next week.  If symptoms worsen in the meantime the patient will need to present to the emergency room.  Wyn Quaker, FNP

## 2018-05-02 NOTE — Assessment & Plan Note (Addendum)
Will get chest x-ray today We will consider therapeutic and diagnostic thoracentesis if right effusion is increased

## 2018-05-05 ENCOUNTER — Inpatient Hospital Stay (HOSPITAL_BASED_OUTPATIENT_CLINIC_OR_DEPARTMENT_OTHER): Payer: Medicare Other | Admitting: Obstetrics

## 2018-05-05 ENCOUNTER — Ambulatory Visit: Payer: Self-pay | Admitting: Obstetrics

## 2018-05-05 ENCOUNTER — Other Ambulatory Visit: Payer: Self-pay | Admitting: Pulmonary Disease

## 2018-05-05 ENCOUNTER — Encounter: Payer: Self-pay | Admitting: Obstetrics

## 2018-05-05 ENCOUNTER — Telehealth: Payer: Self-pay | Admitting: Emergency Medicine

## 2018-05-05 VITALS — BP 137/52 | HR 92 | Temp 98.3°F | Resp 20 | Ht 64.0 in | Wt 166.2 lb

## 2018-05-05 DIAGNOSIS — J90 Pleural effusion, not elsewhere classified: Secondary | ICD-10-CM

## 2018-05-05 DIAGNOSIS — Z90710 Acquired absence of both cervix and uterus: Secondary | ICD-10-CM

## 2018-05-05 DIAGNOSIS — Z90722 Acquired absence of ovaries, bilateral: Secondary | ICD-10-CM

## 2018-05-05 DIAGNOSIS — C569 Malignant neoplasm of unspecified ovary: Secondary | ICD-10-CM

## 2018-05-05 NOTE — Telephone Encounter (Signed)
Spoke with daughter [name] per [place], she needed to confirm the time and location of the thoracentesis. Also needed clarification of Xarelto, patient is to take xarelto tonight and hold it Tuesday, Wednesday, and Thursday before thoracentesis Friday. No further questions at this time.

## 2018-05-05 NOTE — Patient Instructions (Signed)
Plan to follow up in three months or sooner if needed.  Please call for any questions or concerns.

## 2018-05-06 ENCOUNTER — Ambulatory Visit (HOSPITAL_COMMUNITY): Payer: Self-pay

## 2018-05-07 NOTE — Progress Notes (Signed)
Progress Note: Gyn-Onc   CC:  Chief Complaint  Patient presents with  . Malignant neoplasm of ovary, unspecified laterality [facility])    HPI: [name]  is a very nice 84 y.o. P1  . Interval History:   I did take her for interval debulking which resulted in RD1 disease 03/20/2018 performing Exploratory laparotomy with total hysterectomy and bilateral salpingo-oophorectomy, infragastic omentectomy.  She returns for a vaginal cuff check today.  She has been taken off of chemo (no adjuvant chemo was prescribed) and is now on hormone therapy.  . Initial Presentation: She was initially seen in consultation 01/2017 at the request of Dr Lynnette Caffey for a 4cm right ovarian cyst and elevated CA 125.  The cyst was first detected on imaging (CT) in 2012 (6 years prior to initial referral) and was approximately 4cm. It was again seen on a CT scan in August, 2017 and was stable in dimension, and described by radiology as likely being benign.   Her CA 125 in May 2017 was 45. It was 65 in August, 2017.   She was scheduled for a repeat CA 125 in March, 13, 2018 this was elevated to 133.5.  It was at this time she was referred to [facility].  The patient denied abdominal bloating or early satiety and she had no vaginal bleeding.  Her major complaint was back pain which has been very bad in the past 6 months and she has been regularly on steroid tapers (most recently completed 2 weeks ago). She has received multiple joint injections for this also.  She takes Xarelto for atrial fibrillation. She has ulcerative colitis. Her bouts have been less prominent lately, likely secondary to prednisone use for her back.  CT scan on 02/07/17 was ordered due to the rising CA 125. It showed a normal appearance of uterus. A simple appearing cystic lesion is again seen in the right adnexa which measures 3.8 x 3.7cm. This remains stable since 2012, consistent with benign etiology. No other masses or inflammatory  process identified.  Given her serious medical comorbidities, including COPD, surgery was not performed for what appeared to be consistent with a benign lesion as the risk were felt to outweigh the benefits.  In August, 2018 she began experiencing increasing abdominal distension and discomfort.  On 07/07/17 she was admitted to [facility] for abdominal distension and SOB. CT imaging showed interim finding of moderate ascites within the abdomen and pelvis with additional finding of diffuse nodular infiltration of the omentum and anterior mesenteric fat, the appearance would be consistent with peritoneal carcinomatosis/metastatic disease. Increasing retroperitoneal and upper abdominal adenopathy. Re- demonstrated 3.8 cm cyst in the right adnexa. Enlarging soft tissue density in the left adnexa now with possible cystic component posteriorly. In light of the above findings, concern is for ovarian neoplasm. Correlation with pelvic ultrasound recommended. Small right-sided pleural effusion, new since prior study Stable hypodense splenic lesions since 2017.  US guided paracentesis was performed on 07/01/17 which showed adenocarcinoma, favor gynecologic primary. She had multiple paracenteses because her ascites continued to accumulate and caused respiratory distress. In addition she had pleural effusions but these were considered too small for thoracentesis.   She initiated neoadjuvant chemotherapy due to her poor performance status. She underwent chemotherapy with Carbo/Taxol x 6 cycles 07/19/17-11/04/17. She saw Dr. Fermin Schwab after Cycle 3 and due to her poor performance status and continued oxygen requirement was felt not to be a surgical candidate at that time.   She then had  PNA and was sent to rehab for a bit. She followed up with Dr. Skeet Latch 12/05/17 with initial thought for reconsideration of interval debulking, but due to the setback from the PNA and continued poor pulmonary function it  was recommended against surgery at that time. She was having a difficult time with double-agent chemotherapy; experiencing debilitated state and significant arthralgias. She was changed to single agent Carboplatin (about 5.5 weeks from her last Carbo/Tax dosing).   She had been receiving single agent chemotherapy (Carbo) since 12/13/17. She received Cycle 3 on 02/04/18.  Dr. Gerarda Fraction met the patient for the first time 02/27/2018.  Consideration was given to interval debulking due to improved performance status and platinum sensitivity with continued response (by CA125 and imaging)  She did have an admission for rectal bleeding.  She was transfused and seen by GI.  Has history of atrial fibrillation and she was on Xarelto which was then held  Measurement of disease:  Recent Labs    07/18/17 1421 08/09/17 0948 08/15/17 0935 09/20/17 1011 10/14/17 0838 11/04/17 0853 12/13/17 1232 01/03/18 1305 01/14/18 1343 02/04/18 1238 03/13/18 0940 03/31/18 0841 04/21/18 1152  CAN125 1,941.0* 1,665.0* 937.9* 347.0* 307.4* 262.5* 197.7* 183.1* 177.4* 168.5* 178.7* 215.8* 187.3*    Carbo/Taxol C1 07/19/2017 --> C6 11/04/2017 changed to C1 single agent carbo 12/13/2017 --> C3 single agent carbo 02/04/2018   Radiology:  Multiple CT scans in the system reviewed by me personally  03/07/2018-PET-moderate sized right pleural effusion, small left pleural effusion.  No hypermetabolic mediastinal or hilar nodes.  Stable hazy interstitial nodularity in the omentum without discrete measurable nodules or hyper mid to botulism.  No evidence of peritoneal surface disease involving the liver.  No hepatic metastatic disease.  No hypermetabolic abdominal or pelvic lymph nodes.  Stable 4 cm simple appearing right adnexal cyst no hypermetabolic in or around that lesion.  Left hip hardware.    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 Interim finding of moderate ascites within the abdomen and pelvis with additional finding of diffuse nodular infiltration of the omentum and anterior mesenteric fat, the appearance would be consistent with peritoneal carcinomatosis/metastatic disease. Increasing retroperitoneal and upper abdominal adenopathy. 2. Re- demonstrated 3.8 cm cyst in the right adnexa. Enlarging soft tissue density in the left adnexa now with possible cystic component posteriorly. In light of the above findings, concern is for ovarian neoplasm. Correlation with pelvic ultrasound recommended. 3. Small right-sided pleural effusion, new since prior study 4. Stable hypodense splenic lesions since 2017.       06/25/2017 Imaging    US pelvis: 2.9 cm simple appearing cyst in the right ovary. Left ovary grossly unremarkable. Large volume ascites in the pelvis      06/30/2017 - 07/01/2017 Hospital Admission    She was admitted for evaluation of abdominal pain and ascites  07/01/2017 Pathology Results    PERITONEAL/ASCITIC FLUID(SPECIMEN 1 OF 1 COLLECTED 07/01/17): - POORLY DIFFERENTIATED CARCINOMA; SEE COMMENT Source Peritoneal/Ascitic Fluid, (specimen 1 of 1 collected 07/01/17) Gross Specimen: Received is/are 1000 cc's of brownish fluid. (BS:bs) Prepared: # Smears: 0 # Concentration Technique Slides (i.e. ThinPrep): 1 # Cell Block: 1 Additional Studies: Also received Hematology slide - [id number]. Comment The tumor cells are positive for cytokeratin 7 and Pax-8 but negative for cytokeratin 20,  CDX-2, GATA-3, Napsin-A and TTF-1. Based on the immunoprofile a gynecology primary is favored      07/01/2017 Procedure    Successful ultrasound-guided diagnostic and therapeutic paracentesis yielding 2.5 liters of peritoneal fluid      07/07/2017 - 07/09/2017 Hospital Admission    She was admitted for management of malignant ascites      07/08/2017 Procedure    Successful ultrasound-guided therapeutic paracentesis yielding 2.7 liters liters of peritoneal fluid      07/12/2017 Procedure    Successful ultrasound-guided paracentesis yielding 1450 mL of peritoneal fluid      07/18/2017 - 07/24/2017 Hospital Admission    She was admitted for expedited treatment      07/18/2017 Tumor Marker    Patient's tumor was tested for the following markers: CA125 Results of the tumor marker test revealed 1941      07/19/2017 - 02/04/2018 Chemotherapy    The patient had 6 cycles of carboplatin & Taxol for chemotherapy treatment, followed by 3 more cycles of carboplatin only       08/06/2017 Procedure    Successful ultrasound-guided therapeutic paracentesis yielding 2.6 liters of peritoneal fluid.      08/09/2017 Tumor Marker    Patient's tumor was tested for the following markers: CA125 Results of the tumor marker test revealed 1665      08/15/2017 Tumor Marker    Patient's tumor was tested for the following markers: CA125 Results of the tumor marker test revealed 937.9      08/20/2017 Imaging    ECHO: Normal LV size with EF 60-65%. Normal RV size and systolic function. No significant valvular abnormalities.      09/18/2017 Imaging    Chest Impression:  1. No evidence thoracic metastasis. 2. Interval increase and RIGHT pleural effusion.  Abdomen / Pelvis Impression:  1. Interval decrease in intraperitoneal free fluid. 2. Interval decrease in omental nodularity in the LEFT ventral peritoneal space. 3. Interval decrease in nodularity associated with the LEFT ovary. 4. Cystic  portion of the RIGHT ovary is increased mildly in size.      09/20/2017 Tumor Marker    Patient's tumor was tested for the following markers: CA125 Results of the tumor marker test revealed 347      10/14/2017 Tumor Marker    Patient's tumor was tested for the following markers: CA125 Results of the tumor marker test revealed 307.4      11/04/2017 Tumor Marker    Patient's tumor was tested for the following markers: CA125 Results of the tumor marker test revealed 262.5      11/28/2017 Imaging    1. Interval decrease in right pleural effusion with resolution of right atelectasis seen previously. 2. New small left pleural effusion, symmetric to the right. 3. No intraperitoneal free fluid on the current study. 4. Continued further decrease in left omental disease, appearing less confluent today than on the prior study. 5. Left ovary remains normal in appearance today and the right adnexal cystic lesion is decreased in size compared to prior study. 6. 14 mm  pancreatic cyst is unchanged. Continued attention on follow-up imaging recommended. 7. Aortic Atherosclerois (ICD10-170.0)      12/13/2017 Tumor Marker    Patient's tumor was tested for the following markers: CA125 Results of the tumor marker test revealed 197.7      01/03/2018 Tumor Marker    Patient's tumor was tested for the following markers: CA125 Results of the tumor marker test revealed 183.1      01/14/2018 Tumor Marker    Patient's tumor was tested for the following markers: CA125 Results of the tumor marker test revealed 177.4      02/04/2018 Tumor Marker    Patient's tumor was tested for the following markers: CA125 Results of the tumor marker test revealed 168.5      02/25/2018 Imaging    1. Omental carcinomatosis appears qualitatively stable to slightly decreased. Stable mild peritoneal thickening in the paracolic gutters. Stable right adnexal cyst. No ascites. No new or progressive metastatic disease in the abdomen  or pelvis. 2. Small dependent right pleural effusion is increased. 3. Cystic pancreatic body lesion is decreased and now subcentimeter in size, suggesting a benign lesion. 4. Aortic Atherosclerosis (ICD10-I70.0).      03/03/2018 - 03/07/2018 Hospital Admission    She was hospitalized for GI bleed requiring blood transfusions. Xarelto was placed on hold      03/07/2018 PET scan    1. Persistent hazy omental interstitial nodularity but no hypermetabolism or discrete measurable nodules. No abdominal ascites. 2. No findings for metastatic disease involving the chest. 3. Moderate-sized right pleural effusion and small left pleural effusion.       03/20/2018 Pathology Results    1. Ovary and fallopian tube, right - OVARY AND FALLOPIAN TUBE INVOLVED BY SEROUS CARCINOMA. - PARATUBAL CYST. 2. Uterus +/- tubes/ovaries, neoplastic, cervix, left ovary and fallopian tube - LEFT OVARY: HIGH GRADE SEROUS CARCINOMA WITH TREATMENT EFFECT, SPANNING 2.5 CM. CARCINOMA INVOLVES OVARIAN SURFACE. SEE ONCOLOGY TABLE. - LEFT FALLOPIAN TUBE: INVOLVED BY SEROUS CARCINOMA. - UTERUS: -ENDOMETRIUM: INACTIVE ENDOMETRIUM. NO HYPERPLASIA OR MALIGNANCY. -MYOMETRIUM: UNREMARKABLE. NO MALIGNANCY. -SEROSA: INVOLVED BY SEROUS CARCINOMA. - CERVIX: ENDOCERVICAL POLYP. NO MALIGNANCY. 3. Omentum, resection for tumor - INVOLVED BY SEROUS CARCINOMA. 4. Soft tissue, biopsy, mesenteric nodule - INVOLVED BY SEROUS CARCINOMA. Microscopic Comment 2. OVARY or FALLOPIAN TUBE or PRIMARY PERITONEUM: Procedure: Total hysterectomy and bilateral salpingo-oophorectomy. Omentectomy. Mesenteric lymph node biopsy. Specimen Integrity: Intact. Tumor Site: Left ovary. Ovarian Surface Involvement (required only if applicable): Present. Fallopian Tube Surface Involvement (required only if applicable): Present, bilateral. Tumor Size: 2.5 cm. Histologic Type: High grade serous carcinoma. Histologic Grade: High grade. Implants (required for  advanced stage serous/seromucinous borderline tumors only): N/A. Other Tissue/ Organ Involvement: Bilateral fallopian tubes, right ovary, uterine serosa, omentum. Largest Extrapelvic Peritoneal Focus (required only if applicable): Microscopic, estimated 0.5 cm (omentum). Peritoneal/Ascitic Fluid: Prior Positive ([id number]). Treatment Effect (required only for high-grade serous carcinomas): Present in left ovary. CRS2. Regional Lymph Nodes: No lymph nodes submitted/identified. Pathologic Stage Classification (pTNM, AJCC 8th Edition): ypT3b, ypNX Representative Tumor Block: 1A, 1B, 67F, 77F. Comment(s): The right ovary has only surface deposits with a large paratubal cyst. The left ovary has intraparenchymal tumor with associated treatment effect. Thus the tumor location is classified as a left ovarian primary.      03/20/2018 Surgery    Procedure(s) Performed:  1. Exploratory laparotomy with total hysterectomy and bilateral salpingo-oophorectomy 2. Infragastic Omentectomy  3. Debulking to <1cm gross residual disease   Surgeon: Mart Piggs, MD  Specimens:  Uterus Cervix, Bilateral tubes / ovaries and omentum. Mesenteric nodule.  Operative Findings: Debulked to gross residual disease <1cm; however there is miliary disease in multiple locations including the majority of the abdominal peritoneum (anterior abdominal wall, bilateral gutters), diaphragm (Right>left), majority of small bowel mesentary. Normal appendix. Normal small uterus. Right ovary with a cystic lesion ~3cm, some adhesive disease of right adnexa to rectum/sigmoid. Gross omental disease, which was resected with the omentectomy. Smooth liver surface, but again, diaphragmatic disease noted.         03/20/2018 Genetic Testing    Patient has genetic testing done for ER/PR. Results revealed patient has ER: 90%, PR 0%.       03/31/2018 Tumor Marker    Patient's tumor was tested for the following markers:  CA125 Results of the tumor marker test revealed 215.8       Genetic Testing    Patient has genetic testing done for BRCA 1. Results revealed patient has the following: BRCA 1: no loss of expression.       Genetic Testing    Patient has genetic testing done for MMR . Results revealed patient has the following:  MMR: normal      03/31/2018 Genetic Testing    Patient has genetic testing done for BRCA1/2. Results revealed patient has no actionable mutations. She is found to have [place] genetic change of unknown significance      04/21/2018 Imaging    1. No definite findings of residual or recurrent metastatic disease in the abdomen or pelvis status post interval TAHBSO and omentectomy. Stable minimal thickening in the paracolic gutters without discrete peritoneal nodularity. 2. Trace free fluid in the pelvic cul-de-sac. 3. Stable small dependent right pleural effusion. 4. Subcentimeter pancreatic body cystic lesion is stable to slightly decreased. 5. Aortic Atherosclerosis (ICD10-I70.0).      04/21/2018 Tumor Marker    Patient's tumor was tested for the following markers: CA125 Results of the tumor marker test revealed 187.3      Current Meds:  Outpatient Encounter Medications as of 05/05/2018  Medication Sig  . albuterol (PROAIR HFA) 108 (90 Base) MCG/ACT inhaler Inhale 2 puffs into the lungs every 6 (six) hours as needed for wheezing or shortness of breath.  . ALPRAZolam (XANAX) 0.25 MG tablet Take 1 tablet (0.25 mg total) by mouth at bedtime as needed for anxiety.  Marland Kitchen azelastine (ASTELIN) 0.1 % nasal spray Place 2 sprays into both nostrils 2 (two) times daily as needed for allergies.   . Cholecalciferol (VITAMIN D) 2000 units CAPS Take 2,000 Units by mouth daily.  Marland Kitchen dicyclomine (BENTYL) 10 MG capsule Take 1 tab by mouth every morning. May take twice daily as needed.  . diltiazem (CARDIZEM CD) 120 MG 24 hr capsule Take 1 capsule (120 mg total) by mouth every evening.  . diltiazem  (CARDIZEM CD) 180 MG 24 hr capsule Take 1 capsule (180 mg total) by mouth every morning.  . diphenhydrAMINE (BENADRYL) 25 mg capsule Take 25 mg by mouth every 6 (six) hours as needed for itching.  . diphenoxylate-atropine (LOMOTIL) 2.5-0.025 MG tablet Take 1 tablet by mouth 4 (four) times daily. (Patient taking differently: Take 1 tablet by mouth 4 (four) times daily as needed for diarrhea or loose stools. )  . DULoxetine (CYMBALTA) 30 MG capsule TAKE 1 CAPSULE(30 MG) BY MOUTH DAILY AT NIGHT  . fluticasone (FLONASE) 50 MCG/ACT nasal spray Place 2 sprays into both nostrils 2 (two) times daily as needed (FOR NASAL CONGESTION.).   Marland Kitchen furosemide (  LASIX) 20 MG tablet Take 1 tablet (20 mg total) by mouth daily.  Marland Kitchen letrozole (FEMARA) 2.5 MG tablet Take 1 tablet (2.5 mg total) by mouth daily.  Marland Kitchen levothyroxine (SYNTHROID, LEVOTHROID) 137 MCG tablet Take 137 mcg by mouth daily before breakfast. For thyroid therapy  . lidocaine-prilocaine (EMLA) cream Apply 1 application topically as needed. (Patient taking differently: Apply 1 application topically as needed (port). )  . mirtazapine (REMERON) 15 MG tablet TAKE 1 TABLET(15 MG) BY MOUTH AT BEDTIME  . morphine (MS CONTIN) 15 MG 12 hr tablet Take 1 tablet (15 mg total) by mouth every 12 (twelve) hours.  . ondansetron (ZOFRAN) 8 MG tablet Take 8 mg by mouth every 8 (eight) hours as needed for nausea or vomiting.  . polyvinyl alcohol (LIQUIFILM TEARS) 1.4 % ophthalmic solution 1 drop 2 (two) times daily as needed for dry eyes.  . potassium chloride (K-DUR) 10 MEQ tablet Take 10 mEq by mouth daily.  . rivaroxaban (XARELTO) 20 MG TABS tablet Take 20 mg by mouth daily with supper.  . Tiotropium Bromide-Olodaterol (STIOLTO RESPIMAT) 2.5-2.5 MCG/ACT AERS Inhale 2 puffs into the lungs daily.  . [DISCONTINUED] Calcium Carbonate (CALCIUM 600) 1500 MG TABS Take 2 tablets by mouth daily.    . [DISCONTINUED] ipratropium-albuterol (DUONEB) 0.5-2.5 (3) MG/3ML SOLN Take 3 mLs by  nebulization 3 (three) times daily. (Patient not taking: Reported on 05/05/2018)   No facility-administered encounter medications on file as of 05/05/2018.     Allergy:  Allergies  Allergen Reactions  . Codeine Itching    Social Hx:   Tobacco Use: Former smoker with 20-pack-year quit in 1982 Alcohol Use: Rare Illicit Drug Use: None   Past Surgical Hx:  Past Surgical History:  Procedure Laterality Date  . CARDIOVASCULAR STRESS TEST  09/23/2012   Low risk lexiscan nuclear study w/ apical thinning but no evidence of ischemia/  normal LV function and wall motion , ef 75%  . CATARACT EXTRACTION W/ INTRAOCULAR LENS  IMPLANT, BILATERAL  10/2016  . COLONOSCOPY    . DEBULKING N/A 03/20/2018   Procedure: DEBULKING;  Surgeon: Isabel Caprice, MD;  Location: [facility];  Service: Gynecology;  Laterality: N/A;  . EXAM UNDER ANESTHESIA WITH MANIPULATION OF KNEE Left 12-20-2003  dr Noemi Chapel   post TKA  . FEMUR IM NAIL Left 12/11/2013   Procedure: INTRAMEDULLARY (IM) NAIL FEMORAL;  Surgeon: [name], MD;  Location: [facility];  Service: Orthopedics;  Laterality: Left;  . HYSTERECTOMY ABDOMINAL WITH SALPINGO-OOPHORECTOMY Bilateral 03/20/2018   Procedure: TOTAL HYSTERECTOMY ABDOMINAL WITH BILATERAL  SALPINGO-OOPHORECTOMY;  Surgeon: Isabel Caprice, MD;  Location: [facility];  Service: Gynecology;  Laterality: Bilateral;  . IR FLUORO GUIDE PORT INSERTION RIGHT  07/22/2017  . IR PARACENTESIS  07/12/2017  . IR THORACENTESIS ASP PLEURAL SPACE W/IMG GUIDE  03/17/2018  . IR US GUIDE VASC ACCESS RIGHT  07/22/2017  . KNEE ARTHROSCOPY W/ LATERAL RELEASE Left 09-03-2005   dr Noemi Chapel  [facility]   w/  Lysis Adhesions,  excision loose body's  . LAPAROSCOPIC CHOLECYSTECTOMY  12-04-2010  dr zeigler  . LAPAROTOMY N/A 03/20/2018   Procedure: EXPLORATORY LAPAROTOMY;  Surgeon: Isabel Caprice, MD;  Location: [facility];  Service: Gynecology;  Laterality: N/A;  . OMENTECTOMY N/A 03/20/2018   Procedure: OMENTECTOMY;  Surgeon: Isabel Caprice, MD;  Location: [facility];  Service: Gynecology;  Laterality: N/A;  . TOTAL KNEE ARTHROPLASTY Left 09-08-2003   dr Noemi Chapel  [facility]  . TOTAL KNEE REVISION  08/06/2012  Procedure: TOTAL KNEE REVISION;  Surgeon: [name], MD;  Location: [facility];  Service: Orthopedics;  Laterality: Left;  Left Total Knee Arthroplasty Revision  . TRANSTHORACIC ECHOCARDIOGRAM  08/20/2017   ef 60-65%,  grade 1 diastolic dysfunction/  trivial AR and TR  . Uterine polypectomy      Past Medical Hx:  Past Medical History:  Diagnosis Date  . Anxiety   . Chronic blood loss anemia    03-04-2018 diverticular bleed and rectal bleeding,  transfused 2 units PRBCs 03-08-2018  . COPD (chronic obstructive pulmonary disease) ([place])    pulmologist-  dr byrum  . Depression   . Diastolic CHF, chronic ([place])    followed by cardiology  . Diverticulosis   . Dyspnea on effort   . Family history of colon cancer   . Family hx of colon cancer   . Fibromyalgia   . Genetic testing 04/07/2018   MyRisk (35 genes) @ [facility] - No pathogenic mutations detected  . GERD (gastroesophageal reflux disease)    03-17-2018 per pt has not had any gerd issues in long time and no meds prn  . Hemorrhoids   . Hiatal hernia   . History of lower GI bleeding 03/04/2018   admission--  dx diverticular bleed and rectal bleed in setting of xarelto-- xarelto stopped and transfused 03-08-2018 2 units PRBCs  . History of rectal polyps   . History of shingles   . Hypothyroidism   . IBS (irritable bowel syndrome)   . Lymphocytic colitis    FOLLOWED BY DR Henrene Pastor  . Malignant ascites    dx at admission 09/ 2018 abdominal s/p  parencentesis 07-01-2017 2.5L,  07-08-2016  2.7L,  07-12-2017  1418m  . [place] drug [place]   . Osteoporosis   . Ovarian cancer ([facility] oncologist-  gors[place]/  dr pGerarda Fraction  chemotherapy 07-19-2017 to 11-04-2017  . Paroxysmal atrial fibrillation ([place]    CARDIOLOGIST-  DR S. MCDOWELL-  first dx'd 07/  2014---was taking xarelto up until 03-07-2018 stopped due to lower GI bleed  . Pleural effusion    s/p  right thoracentesis, 02-2018 1.3L and 03-17-2018 right thoracentesis 6491m, post cxr no residual effusion  . [place]iatic arthritis [place]  . Schatzki's ring    S/P  DILATERAL 2013  . Seasonal allergic rhinitis     Past Gynecological History:   GYNECOLOGIC HISTORY:  No LMP recorded. Patient is postmenopausal. P 1 LMP 40s Contraceptive none HRT none    Family Hx:  Family History  Problem Relation Age of Onset  . Heart disease Father        pacemaker  . Lung cancer Father        smoker; deceased [id number]. Colon cancer Mother [id number]     2nd rectal ca at 7842. Colon cancer Maternal Grandfather        dx 7052sdeceased 7041s. Depression Sister   . Leukemia Maternal Aunt        deceased [id number]  Review of Systems:  Review of Systems  HENT:   Positive for mouth sores.   Respiratory: Positive for cough and shortness of breath.   Gastrointestinal: Positive for diarrhea.  Musculoskeletal: Positive for arthralgias and gait problem.  Skin: Positive for itching.  Neurological: Positive for gait problem.  Hematological: Bruises/bleeds easily.  All other systems reviewed and are negative.   Vitals:  Blood pressure (!) 137/52, pulse 92, temperature 98.3 F (36.8  C), temperature source Oral, resp. rate 20, height 5' 4" (1.626 m), weight 166 lb 3.2 oz (75.4 kg), SpO2 97 %. Body mass index is 28.53 kg/m.  Physical Exam:  ECOG PERFORMANCE STATUS: 1 - Symptomatic but completely ambulatory   General :  Well developed, 84 y.o., female in no apparent distress HEENT:  Normocephalic/atraumatic, symmetric, EOMI, eyelids normal Neck:   Supple, no masses.  Lymphatics:  No cervical/ submandibular/ supraclavicular/ infraclavicular/ inguinal adenopathy Respiratory:  Respirations unlabored, no use of accessory muscles CV:   Deferred Breast:  Deferred Musculoskeletal: No CVA tenderness, normal  muscle strength. Abdomen:  Wound CDI. Soft, non-tender and nondistended. No evidence of hernia. No masses. Extremities:  No lymphedema, no erythema, non-tender. Skin:   Normal inspection Neuro/Psych:  No focal motor deficit, no abnormal mental status. Normal gait. Normal affect. Alert and oriented to person, place, and time  Genito Urinary: Vulva: Normal external female genitalia.  Bladder/urethra: Urethral meatus normal in size and location. No lesions or   masses, well supported bladder Speculum exam: Vagina: No lesion, no discharge, no bleeding. Cuff is healing well. Bimanual exam: Cervix/Uterus/Adnexa: Surgically absent  Adnexa: No masses. Rectovaginal:  Deferred   Oncologic Summary: 1. 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  Prolonged chemotherapy treatment due to poor performance status see details above  Assessment/Plan: 1. Followup on BRCA testing of tumor 2. Hormone therapy per Dr. Alvy Bimler. 3. RTC 3 months for continued pelvic exams   Isabel Caprice, MD  05/07/2018, 7:30 PM   Cc: Asencion Noble, MD (PCP) Heath Lark, MD (Medical Oncology)

## 2018-05-08 ENCOUNTER — Other Ambulatory Visit: Payer: Self-pay | Admitting: Pulmonary Disease

## 2018-05-08 ENCOUNTER — Ambulatory Visit (HOSPITAL_COMMUNITY)
Admission: RE | Admit: 2018-05-08 | Discharge: 2018-05-08 | Disposition: A | Discharge status: Home / Self Care | Payer: Medicare Other | Source: Ambulatory Visit | Attending: Pulmonary Disease | Admitting: Pulmonary Disease

## 2018-05-08 ENCOUNTER — Encounter (HOSPITAL_COMMUNITY): Payer: Self-pay | Admitting: Physician Assistant

## 2018-05-08 ENCOUNTER — Inpatient Hospital Stay (HOSPITAL_COMMUNITY): Admission: RE | Admit: 2018-05-08 | Payer: Self-pay | Source: Ambulatory Visit

## 2018-05-08 DIAGNOSIS — I509 Heart failure, unspecified: Secondary | ICD-10-CM | POA: Diagnosis not present

## 2018-05-08 DIAGNOSIS — Z9981 Dependence on supplemental oxygen: Secondary | ICD-10-CM | POA: Diagnosis not present

## 2018-05-08 DIAGNOSIS — J90 Pleural effusion, not elsewhere classified: Secondary | ICD-10-CM

## 2018-05-08 DIAGNOSIS — J449 Chronic obstructive pulmonary disease, unspecified: Secondary | ICD-10-CM | POA: Diagnosis not present

## 2018-05-08 DIAGNOSIS — C569 Malignant neoplasm of unspecified ovary: Secondary | ICD-10-CM | POA: Insufficient documentation

## 2018-05-08 HISTORY — PX: IR THORACENTESIS ASP PLEURAL SPACE W/IMG GUIDE: IMG5380

## 2018-05-08 LAB — GRAM STAIN

## 2018-05-08 LAB — BODY FLUID CELL COUNT WITH DIFFERENTIAL
Eos, Fluid: 0 %
Lymphs, Fluid: 63 %
MONOCYTE-MACROPHAGE-SEROUS FLUID: 34 % — AB (ref 50–90)
Neutrophil Count, Fluid: 3 % (ref 0–25)
WBC FLUID: 1046 uL — AB (ref 0–1000)

## 2018-05-08 LAB — GLUCOSE, PLEURAL OR PERITONEAL FLUID: Glucose, Fluid: 113 mg/dL

## 2018-05-08 LAB — LACTATE DEHYDROGENASE, PLEURAL OR PERITONEAL FLUID: LD FL: 80 U/L — AB (ref 3–23)

## 2018-05-08 LAB — AMYLASE, PLEURAL OR PERITONEAL FLUID: AMYLASE FL: 49 U/L

## 2018-05-08 LAB — ALBUMIN, PLEURAL OR PERITONEAL FLUID: Albumin, Fluid: 2.6 g/dL

## 2018-05-08 LAB — PROTEIN, PLEURAL OR PERITONEAL FLUID: TOTAL PROTEIN, FLUID: 4.1 g/dL

## 2018-05-08 MED ORDER — LIDOCAINE HCL (PF) 2 % IJ SOLN
INTRAMUSCULAR | Status: DC | PRN
  Administered 2018-05-08: 10 mL

## 2018-05-08 NOTE — Procedures (Signed)
PROCEDURE SUMMARY:  Successful image-guided right thoracentesis. Yielded 800 milliliters of amber fluid. Patient tolerated procedure well. No immediate complications.  Specimen was sent for labs. CXR ordered.  Joaquim Nam PA-C 05/08/2018 10:28 AM

## 2018-05-09 ENCOUNTER — Ambulatory Visit (HOSPITAL_COMMUNITY): Admission: RE | Admit: 2018-05-09 | Payer: Self-pay | Source: Ambulatory Visit

## 2018-05-09 LAB — PH, BODY FLUID: PH, BODY FLUID: 7.7

## 2018-05-10 LAB — CHOLESTEROL, BODY FLUID: CHOL FL: 76 mg/dL

## 2018-05-13 LAB — CULTURE, BODY FLUID W GRAM STAIN -BOTTLE

## 2018-05-13 LAB — CULTURE, BODY FLUID-BOTTLE: CULTURE: NO GROWTH

## 2018-05-14 ENCOUNTER — Encounter (HOSPITAL_COMMUNITY): Payer: Self-pay

## 2018-05-14 ENCOUNTER — Ambulatory Visit (HOSPITAL_COMMUNITY): Payer: Medicare Other | Attending: Hematology and Oncology

## 2018-05-14 ENCOUNTER — Telehealth: Payer: Self-pay | Admitting: Pulmonary Disease

## 2018-05-14 DIAGNOSIS — M6281 Muscle weakness (generalized): Secondary | ICD-10-CM | POA: Diagnosis not present

## 2018-05-14 DIAGNOSIS — R2689 Other abnormalities of gait and mobility: Secondary | ICD-10-CM | POA: Insufficient documentation

## 2018-05-14 DIAGNOSIS — R262 Difficulty in walking, not elsewhere classified: Secondary | ICD-10-CM | POA: Insufficient documentation

## 2018-05-14 DIAGNOSIS — R29898 Other symptoms and signs involving the musculoskeletal system: Secondary | ICD-10-CM | POA: Insufficient documentation

## 2018-05-14 DIAGNOSIS — M79662 Pain in left lower leg: Secondary | ICD-10-CM | POA: Insufficient documentation

## 2018-05-14 NOTE — Telephone Encounter (Signed)
Called and spoke with pt's daughter-Donna regarding pt's increase cough Pt has increase productive cough-yellow thick mucus since 05/12/2018 [name] is requesting something to better help pt's cough  Pt is going out of town this weekend, and would like cough to be controlled Routing message to B.Mack to advise

## 2018-05-14 NOTE — Telephone Encounter (Signed)
LMTCB for [name]   In the meantime will ask Aaron Edelman if he can review the labs for thora  Please advise thanks!

## 2018-05-14 NOTE — Telephone Encounter (Signed)
Pleural fluid results are similar to 6 months ago. No malignant cells identified.  Except for a much higher WBC count.  We will continue to monitor.  Keep follow-up with Dr. Lamonte Sakai this month.  I have updated Dr. Lamonte Sakai regarding the case.  Should she have no further changes to plan of care.  Wyn Quaker FNP-C

## 2018-05-14 NOTE — Therapy (Signed)
[place], [place], [address] Phone: [phone]   Fax:  [phone]  Physical Therapy Evaluation  Patient Details  Name: [name] MRN: [id number] Date of Birth: [birth year] Referring Provider: Dr Heath Lark   Encounter Date: 05/14/2018  PT End of Session - 05/14/18 1225    Visit Number  1    Number of Visits  12    Date for PT Re-Evaluation  06/25/18 mini-re-assess 06/04/2018    Authorization Type  Medicare; secondary - Generic Commercial    Authorization Time Period  05/14/18-06/25/2018    Authorization - Visit Number  1    Authorization - Number of Visits  10    PT Start Time  1118    PT Stop Time  1203    PT Time Calculation (min)  45 min    Equipment Utilized During Treatment  Gait belt    Activity Tolerance  Patient tolerated treatment well    Behavior During Therapy  [facility] for tasks assessed/performed       Past Medical History:  Diagnosis Date  . Anxiety   . Chronic blood loss anemia    03-04-2018 diverticular bleed and rectal bleeding,  transfused 2 units PRBCs 03-08-2018  . COPD (chronic obstructive pulmonary disease) ([place])    pulmologist-  dr byrum  . Depression   . Diastolic CHF, chronic ([place])    followed by cardiology  . Diverticulosis   . Dyspnea on effort   . Family history of colon cancer   . Family hx of colon cancer   . Fibromyalgia   . Genetic testing 04/07/2018   MyRisk (35 genes) @ [facility] - No pathogenic mutations detected  . GERD (gastroesophageal reflux disease)    03-17-2018 per pt has not had any gerd issues in long time and no meds prn  . Hemorrhoids   . Hiatal hernia   . History of lower GI bleeding 03/04/2018   admission--  dx diverticular bleed and rectal bleed in setting of xarelto-- xarelto stopped and transfused 03-08-2018 2 units PRBCs  . History of rectal polyps   . History of shingles   . Hypothyroidism   . IBS (irritable bowel syndrome)   . Lymphocytic  colitis    FOLLOWED BY DR Henrene Pastor  . Malignant ascites    dx at admission 09/ 2018 abdominal s/p  parencentesis 07-01-2017 2.5L,  07-08-2016  2.7L,  07-12-2017  1420m  . Neuropathy due to chemotherapeutic drug ([place]   . Osteoporosis   . Ovarian cancer ([facility] oncologist-  gors[place]/  dr pGerarda Fraction  chemotherapy 07-19-2017 to 11-04-2017  . [place]oxysmal atrial fibrillation ([place]    CARDIOLOGIST-  DR S. MCDOWELL-  first dx'd 07/ 2014---was taking xarelto up until 03-07-2018 stopped due to lower GI bleed  . Pleural effusion    s/p  right thoracentesis, 02-2018 1.3L and 03-17-2018 right thoracentesis 6426m, post cxr no residual effusion  . Psoriatic arthritis ([place]  . Schatzki's ring    S/P  DILATERAL 2013  . Seasonal allergic rhinitis     Past Surgical History:  Procedure Laterality Date  . CARDIOVASCULAR STRESS TEST  09/23/2012   Low risk lexiscan nuclear study w/ apical thinning but no evidence of ischemia/  normal LV function and wall motion , ef 75%  . CATARACT EXTRACTION W/ INTRAOCULAR LENS  IMPLANT, BILATERAL  10/2016  . COLONOSCOPY    . DEBULKING N/A 03/20/2018   Procedure: DEBULKING;  Surgeon: PhIsabel CapriceMD;  Location: [facility];  Service: Gynecology;  Laterality: N/A;  . EXAM UNDER ANESTHESIA WITH MANIPULATION OF KNEE Left 12-20-2003  dr Noemi Chapel   post TKA  . FEMUR IM NAIL Left 12/11/2013   Procedure: INTRAMEDULLARY (IM) NAIL FEMORAL;  Surgeon: [name], MD;  Location: [facility];  Service: Orthopedics;  Laterality: Left;  . HYSTERECTOMY ABDOMINAL WITH SALPINGO-OOPHORECTOMY Bilateral 03/20/2018   Procedure: TOTAL HYSTERECTOMY ABDOMINAL WITH BILATERAL  SALPINGO-OOPHORECTOMY;  Surgeon: Isabel Caprice, MD;  Location: [facility];  Service: Gynecology;  Laterality: Bilateral;  . IR FLUORO GUIDE PORT INSERTION RIGHT  07/22/2017  . IR PARACENTESIS  07/12/2017  . IR THORACENTESIS ASP PLEURAL SPACE W/IMG GUIDE  03/17/2018  . IR THORACENTESIS ASP PLEURAL SPACE W/IMG GUIDE  05/08/2018  . IR US  GUIDE VASC ACCESS RIGHT  07/22/2017  . KNEE ARTHROSCOPY W/ LATERAL RELEASE Left 09-03-2005   dr Noemi Chapel  [facility]   w/  Lysis Adhesions,  excision loose body's  . LAPAROSCOPIC CHOLECYSTECTOMY  12-04-2010  dr zeigler  . LAPAROTOMY N/A 03/20/2018   Procedure: EXPLORATORY LAPAROTOMY;  Surgeon: Isabel Caprice, MD;  Location: [facility];  Service: Gynecology;  Laterality: N/A;  . OMENTECTOMY N/A 03/20/2018   Procedure: OMENTECTOMY;  Surgeon: Isabel Caprice, MD;  Location: [facility];  Service: Gynecology;  Laterality: N/A;  . TOTAL KNEE ARTHROPLASTY Left 09-08-2003   dr Noemi Chapel  [facility]  . TOTAL KNEE REVISION  08/06/2012   Procedure: TOTAL KNEE REVISION;  Surgeon: [name], MD;  Location: [facility];  Service: Orthopedics;  Laterality: Left;  Left Total Knee Arthroplasty Revision  . TRANSTHORACIC ECHOCARDIOGRAM  08/20/2017   ef 60-65%,  grade 1 diastolic dysfunction/  trivial AR and TR  . Uterine polypectomy      There were no vitals filed for this visit.   Subjective Assessment - 05/14/18 1122    Subjective  6 weeks ago had surgery to remove ovarian cancer with part of omentum remived as well. In speaking with your oncologist felt more therapy needed to regain PLOF and strength. Has a personal care assistant 5 hours/day    Patient is accompained by:  Family member daughter    Pertinent History  psoriatic arthritis, COPD; currently undergoing anti-estrogen cancer treatments by pill    Limitations  Lifting;Standing;House hold activities;Walking    How long can you sit comfortably?  not limited    How long can you stand comfortably?  2 minutes due to fatigue    How long can you walk comfortably?  4-5 minutes with RW    Patient Stated Goals  Would like to come to clinic to build up her strength and endurance    Currently in Pain?  Yes    Pain Score  5     Pain Location  Hip    Pain Orientation  Posterior    Pain Descriptors / Indicators  Aching    Pain Type  Other (Comment) due to chemo drug side effect     Aggravating Factors   constant pain at some level but location varies due to chemo pill    Pain Relieving Factors  morphine pill extended release    Effect of Pain on Daily Activities  decreased ability to complete ADLs; walk without RW         [facility] PT Assessment - 05/14/18 0001      Assessment   Medical Diagnosis  Generalized Weakness    Referring Provider  Dr Heath Lark    Prior Therapy  Yes  Prior Function   Level of Independence  Needs assistance with homemaking;Independent with household mobility with device      Cognition   Overall Cognitive Status  Within Functional Limits for tasks assessed      Strength   Right Hip Extension  4-/5    Right Hip ABduction  4+/5    Left Hip Extension  3/5    Left Hip ABduction  4+/5    Right Knee Flexion  4+/5    Right Knee Extension  5/5    Left Knee Flexion  4+/5    Left Knee Extension  5/5    Right Ankle Dorsiflexion  4+/5    Left Ankle Dorsiflexion  4+/5      Ambulation/Gait   Ambulation Distance (Feet)  250 Feet 2MWT    Assistive device  Rolling walker    Gait Pattern  Step-through pattern;Decreased hip/knee flexion - left;Decreased stride length;Trunk flexed    Ambulation Surface  Level    Gait Comments  no standing rest breaks      Balance   Balance Assessed  Yes      Static Standing Balance   Static Standing - Balance Support  No upper extremity supported    Static Standing Balance -  Activities   Single Leg Stance - Right Leg;Single Leg Stance - Left Leg    Static Standing - Comment/# of Minutes  SLS Rt 10sec; Lt 4 sec      Standardized Balance Assessment   Standardized Balance Assessment  Five Times Sit to Stand    Five times sit to stand comments   15.53 sec no upper extremity support      Timed Up and Go Test   Normal TUG (seconds)  22.02                Objective measurements completed on examination: See above findings.              PT Education - 05/14/18 1224    Education  provided  Yes    Education Details  Patient was education on importance of HEP and PT goal to continue strengthening, balance and functional ability increase with patient carry over of HEP.    Person(s) Educated  Patient    Methods  Explanation    Comprehension  Verbalized understanding       PT Short Term Goals - 05/14/18 1234      PT SHORT TERM GOAL #1   Title  Patient and caregivers will be independent in initial HEP with routine performance.     Time  3    Period  Weeks    Status  New    Target Date  06/04/18      PT SHORT TERM GOAL #2   Title  Patient with tolerate the standing position with upper extremity support for 5 minutes before needing to rest to improve her ability to perform standing functional activities and ADLs within her home environment.    Time  3    Period  Weeks    Status  New      PT SHORT TERM GOAL #3   Title  Patient will increase MMT grades by 1/2 grade in deficient musculature to show improvement in her strength and stability to perform functional activities.    Time  3    Period  Weeks    Status  New      PT SHORT TERM GOAL #4   Title  Patient will be able to  stand on either foot for 12 seconds to exhibit improved balance and decreased risk for falls.    Time  3    Period  Weeks    Status  New        PT Long Term Goals - 05/14/18 1237      PT LONG TERM GOAL #1   Title  Patient will ambulate >300 feet with LRAD during the 2 MWT to improve her ability to ambulate further in community to allow to her go enjoy going out for a meal.    Time  6    Period  Weeks    Status  New      PT LONG TERM GOAL #2   Title  Patient with tolerate the standing position with upper extremity support for 10 minutes before needing to rest to improve her ability to stand and have a conversation with a friend or relative.    Time  6    Period  Weeks    Status  New      PT LONG TERM GOAL #3   Title  Patient will increase MMT grades by 1 grade in deficent  musculature to show continued improvement in her strength and stability to perform functional activities at home independently.    Time  6    Period  Weeks    Status  New      PT LONG TERM GOAL #4   Title  Patient will be able to stand on either foot for 16 seconds to exhibit improved balance and further decreased risk for falls.    Time  6    Period  Weeks    Status  New      PT LONG TERM GOAL #5   Title  Pt will be able to perform 5xSTS in 12 sec or < with no UE in order to demo improved functional BLE strength and overall balance.    Time  6    Period  Weeks    Status  New      PT LONG TERM GOAL #6   Title  Pt will report requiring assistance for ADLs and IADLs at home 25% of the time or < to demonstrate improved overall functional strength, endurance, and balance in order to promote independence at home.    Time  6    Period  Weeks    Status  New             Plan - 05/14/18 1228    Clinical Impression Statement  Patient is an 84 year old female with ovarian cancer, s/p total hysterectomy 6 weeks ago, who exhibits generalized weakness, decreased strength, decreased activity tolerance, decreased endurance, decreased balance, and increased risk for falls. Patient would benefit from skilled physical therapy to improved the afore mentioned deficits. Patient may benefit from a Rollator walker to increase her functional ability to ambulate in the community and become for functionally involved in her home with greater ability to take rest breaks.    History and Personal Factors relevant to plan of care:  ovarian cancer, COPD, s/p total hysterectomy 6 wks ago, h/o left hip pinning and 2 Lt TKA    Clinical Presentation  Stable    Clinical Presentation due to:  MMT, 2MWT, TUG, SLS, 5xSTS, clinical judgement    Clinical Decision Making  Low    Rehab Potential  Good    PT Frequency  2x / week    PT Duration  6 weeks  PT Treatment/Interventions  ADLs/Self Care Home Management;Gait  training;Stair training;Functional mobility training;Therapeutic activities;Patient/family education;Balance training;Neuromuscular re-education;Therapeutic exercise;Manual techniques;Energy conservation    PT Next Visit Plan  initiate functional weight bearing strengthening and balance exercises, gait training possible with SPC?    PT Home Exercise Plan  --    Consulted and Agree with Plan of Care  Patient       Patient will benefit from skilled therapeutic intervention in order to improve the following deficits and impairments:  Abnormal gait, Pain, Decreased mobility, Decreased coordination, Decreased activity tolerance, Decreased endurance, Decreased strength, Decreased balance, Difficulty walking  Visit Diagnosis: Muscle weakness (generalized) - Plan: PT plan of care cert/re-cert  Other abnormalities of gait and mobility - Plan: PT plan of care cert/re-cert  Other symptoms and signs involving the musculoskeletal system - Plan: PT plan of care cert/re-cert  Difficulty in walking, not elsewhere classified - Plan: PT plan of care cert/re-cert     Problem List Patient Active Problem List   Diagnosis Date Noted  . Psoriatic arthritis ([place]) 04/22/2018  . Genetic testing 04/07/2018  . Family history of colon cancer   . History of GI diverticular bleed 03/12/2018  . Lower GI bleed   . Diverticulosis of colon with hemorrhage   . Rectal bleeding 03/04/2018  . Acute GI bleeding 03/04/2018  . Thrombocytopenia ([place]) 01/03/2018  . Anemia, chronic disease 12/13/2017  . Mild protein-calorie malnutrition ([place]) 12/13/2017  . Peripheral neuropathy due to chemotherapy ([place]) 12/13/2017  . S/P thoracentesis   . Atrial fibrillation with RVR ([place]) 11/08/2017  . HCAP (healthcare-associated pneumonia) 11/08/2017  . Pleural effusion 09/20/2017  . Acute on chronic diastolic heart failure ([place]) 08/19/2017  . Pancytopenia, acquired ([place]) 08/15/2017  . Protein-calorie malnutrition, moderate ([place])  07/30/2017  . Generalized weakness 07/30/2017  . Antineoplastic chemotherapy induced pancytopenia ([place]) 07/20/2017  . Left ovarian epithelial cancer ([place]) 07/18/2017  . PNA (pneumonia) 07/07/2017  . Ascites 06/30/2017  . Ascites, malignant 06/30/2017  . Elevated CA-125 01/18/2017  . Depression 12/23/2015  . Peripheral edema 08/23/2015  . Fracture of hip, left, closed ([place]) 12/08/2013  . Hip fracture ([place]) 12/08/2013  . PAD (peripheral artery disease) ([place]) 11/25/2013  . Encounter for therapeutic drug monitoring 11/09/2013  . Cough 03/20/2013  . Total knee replacement status 10/07/2012  . Knee pain 10/07/2012  . Knee stiffness 10/07/2012  . Tachycardia 09/17/2012  . Chest pain 09/17/2012  . Difficulty in walking(719.7) 09/16/2012  . Muscle weakness (generalized) 09/16/2012  . Postop Acute blood loss anemia 08/08/2012  . Instability of prosthetic knee ([place]) 08/06/2012  . Bloating 02/14/2012  . Upper abdominal pain 02/14/2012  . Allergic rhinitis, seasonal 02/15/2011  . COLITIS 02/20/2010  . Diarrhea 01/02/2010  . ABDOMINAL PAIN -GENERALIZED 01/02/2010  . PERSONAL HX COLONIC POLYPS 01/02/2010  . Hypothyroidism 12/28/2009  . COPD (chronic obstructive pulmonary disease) ([place]) 12/28/2009  . ARTHRITIS 12/28/2009    Nanea Jared 05/14/2018, 12:47 PM  [facility] [address] [place], [place], [address] Phone: [phone]   Fax:  [phone]  Name: [name] MRN: [id number] Date of Birth: [birth year]

## 2018-05-14 NOTE — Telephone Encounter (Signed)
Please inform the patient she can use these options:    Marland Kitchen Medications to use:  o Delsym 2 tsp every 12 hrs for cough (this is available over the counter) o Tessalon 254m Three times a day as needed  cough.   Please place order for tessalon. If symptoms worsen patient is more than willing to present for an appointment.   BWyn QuakerFNP-C

## 2018-05-15 ENCOUNTER — Ambulatory Visit (HOSPITAL_COMMUNITY): Payer: Medicare Other | Admitting: Physical Therapy

## 2018-05-15 DIAGNOSIS — R29898 Other symptoms and signs involving the musculoskeletal system: Secondary | ICD-10-CM

## 2018-05-15 DIAGNOSIS — R262 Difficulty in walking, not elsewhere classified: Secondary | ICD-10-CM

## 2018-05-15 DIAGNOSIS — R2689 Other abnormalities of gait and mobility: Secondary | ICD-10-CM

## 2018-05-15 DIAGNOSIS — M6281 Muscle weakness (generalized): Secondary | ICD-10-CM

## 2018-05-15 DIAGNOSIS — M79662 Pain in left lower leg: Secondary | ICD-10-CM

## 2018-05-15 MED ORDER — BENZONATATE 200 MG PO CAPS: 200.0000 mg | ORAL_CAPSULE | Freq: Three times a day (TID) | ORAL | 1 refills | Status: DC | PRN

## 2018-05-15 NOTE — Patient Instructions (Addendum)
Heel Raise: Bilateral (Standing)    Rise on balls of feet. Repeat _10___ times per set. Do 1____ sets per session. Do ___2_ sessions per day.  http://orth.exer.us/38   Copyright  VHI. All rights reserved.  Functional Quadriceps: Chair Squat   At the kitchen counter  Keeping feet flat on floor, shoulder width apart, squat as low as is comfortable. Use support as necessary. Repeat _10___ times per set. Do 1____ sets per session. Do ___2_ sessions per day.  http://orth.exer.us/736   Copyright  VHI. All rights reserved.  Functional Quadriceps: Sit to Stand    Sit on edge of chair, feet flat on floor. Stand upright, extending knees fully. Repeat _5___ times per set. Do ___1_ sets per session. Do ___2_ sessions per day.  http://orth.exer.us/734   Copyright  VHI. All rights reserved.  Balance: Unilateral   At the kitchen counter.  Attempt to balance on left leg, eyes open. Hold _5-10___ seconds. Repeat _3___ times per set. Do __1__ sets per session. Do __2__ sessions per day. Perform exercise with eyes closed.  http://orth.exer.us/28   Copyright  VHI. All rights reserved.

## 2018-05-15 NOTE — Telephone Encounter (Signed)
Called and spoke with pt regarding [name]'s Recommendations Placed order for rx tessalon pearls 200mg  TID Pt's daughter verbalized understanding, nothing further needed.

## 2018-05-15 NOTE — Therapy (Signed)
[place], [place], [address] Phone: [phone]   Fax:  [phone]  Physical Therapy Treatment  Patient Details  Name: [name] MRN: [id number] Date of Birth: 07-18-1933 Referring Provider: Dr Heath Lark   Encounter Date: 05/15/2018  PT End of Session - 05/15/18 1143    Visit Number  2    Number of Visits  12    Date for PT Re-Evaluation  06/25/18    Authorization Type  Medicare; secondary - Generic Commercial    Authorization Time Period  05/14/18-06/25/2018    Authorization - Visit Number  2    Authorization - Number of Visits  10    PT Start Time  1120    PT Stop Time  1200    PT Time Calculation (min)  40 min    Equipment Utilized During Treatment  Gait belt    Activity Tolerance  Patient tolerated treatment well    Behavior During Therapy  [facility] for tasks assessed/performed       Past Medical History:  Diagnosis Date  . Anxiety   . Chronic blood loss anemia    03-04-2018 diverticular bleed and rectal bleeding,  transfused 2 units PRBCs 03-08-2018  . COPD (chronic obstructive pulmonary disease) ([place])    pulmologist-  dr byrum  . Depression   . Diastolic CHF, chronic ([place])    followed by cardiology  . Diverticulosis   . Dyspnea on effort   . Family history of colon cancer   . Family hx of colon cancer   . Fibromyalgia   . Genetic testing 04/07/2018   MyRisk (35 genes) @ [facility] - No pathogenic mutations detected  . GERD (gastroesophageal reflux disease)    03-17-2018 per pt has not had any gerd issues in long time and no meds prn  . Hemorrhoids   . Hiatal hernia   . History of lower GI bleeding 03/04/2018   admission--  dx diverticular bleed and rectal bleed in setting of xarelto-- xarelto stopped and transfused 03-08-2018 2 units PRBCs  . History of rectal polyps   . History of shingles   . Hypothyroidism   . IBS (irritable bowel syndrome)   . Lymphocytic colitis    FOLLOWED BY DR  Henrene Pastor  . Malignant ascites    dx at admission 09/ 2018 abdominal s/p  parencentesis 07-01-2017 2.5L,  07-08-2016  2.7L,  07-12-2017  1443m  . Neuropathy due to chemotherapeutic drug ([place]   . Osteoporosis   . Ovarian cancer ([facility] oncologist-  gorsuch/  dr pGerarda Fraction  chemotherapy 07-19-2017 to 11-04-2017  . Paroxysmal atrial fibrillation ([place]    CARDIOLOGIST-  DR S. MCDOWELL-  first dx'd 07/ 2014---was taking xarelto up until 03-07-2018 stopped due to lower GI bleed  . Pleural effusion    s/p  right thoracentesis, 02-2018 1.3L and 03-17-2018 right thoracentesis 6449m, post cxr no residual effusion  . Psoriatic arthritis ([place]  . Schatzki's ring    S/P  DILATERAL 2013  . Seasonal allergic rhinitis     Past Surgical History:  Procedure Laterality Date  . CARDIOVASCULAR STRESS TEST  09/23/2012   Low risk lexiscan nuclear study w/ apical thinning but no evidence of ischemia/  normal LV function and wall motion , ef 75%  . CATARACT EXTRACTION W/ INTRAOCULAR LENS  IMPLANT, BILATERAL  10/2016  . COLONOSCOPY    . DEBULKING N/A 03/20/2018   Procedure: DEBULKING;  Surgeon: PhIsabel CapriceMD;  Location:  [facility];  Service: Gynecology;  Laterality: N/A;  . EXAM UNDER ANESTHESIA WITH MANIPULATION OF KNEE Left 12-20-2003  dr Noemi Chapel   post TKA  . FEMUR IM NAIL Left 12/11/2013   Procedure: INTRAMEDULLARY (IM) NAIL FEMORAL;  Surgeon: [name], MD;  Location: [facility];  Service: Orthopedics;  Laterality: Left;  . HYSTERECTOMY ABDOMINAL WITH SALPINGO-OOPHORECTOMY Bilateral 03/20/2018   Procedure: TOTAL HYSTERECTOMY ABDOMINAL WITH BILATERAL  SALPINGO-OOPHORECTOMY;  Surgeon: Isabel Caprice, MD;  Location: [facility];  Service: Gynecology;  Laterality: Bilateral;  . IR FLUORO GUIDE PORT INSERTION RIGHT  07/22/2017  . IR PARACENTESIS  07/12/2017  . IR THORACENTESIS ASP PLEURAL SPACE W/IMG GUIDE  03/17/2018  . IR THORACENTESIS ASP PLEURAL SPACE W/IMG GUIDE  05/08/2018  . IR US GUIDE VASC ACCESS RIGHT   07/22/2017  . KNEE ARTHROSCOPY W/ LATERAL RELEASE Left 09-03-2005   dr Noemi Chapel  [facility]   w/  Lysis Adhesions,  excision loose body's  . LAPAROSCOPIC CHOLECYSTECTOMY  12-04-2010  dr zeigler  . LAPAROTOMY N/A 03/20/2018   Procedure: EXPLORATORY LAPAROTOMY;  Surgeon: Isabel Caprice, MD;  Location: [facility];  Service: Gynecology;  Laterality: N/A;  . OMENTECTOMY N/A 03/20/2018   Procedure: OMENTECTOMY;  Surgeon: Isabel Caprice, MD;  Location: [facility];  Service: Gynecology;  Laterality: N/A;  . TOTAL KNEE ARTHROPLASTY Left 09-08-2003   dr Noemi Chapel  [facility]  . TOTAL KNEE REVISION  08/06/2012   Procedure: TOTAL KNEE REVISION;  Surgeon: [name], MD;  Location: [facility];  Service: Orthopedics;  Laterality: Left;  Left Total Knee Arthroplasty Revision  . TRANSTHORACIC ECHOCARDIOGRAM  08/20/2017   ef 60-65%,  grade 1 diastolic dysfunction/  trivial AR and TR  . Uterine polypectomy      There were no vitals filed for this visit.  Subjective Assessment - 05/15/18 1119    Subjective  PT states that she has was not given any exercises.  She is going to the beach for a week     Patient is accompained by:  Family member    Pertinent History  psoriatic arthritis, COPD; currently undergoing anti-estrogen cancer treatments by pill    Limitations  Lifting;Standing;House hold activities;Walking    How long can you sit comfortably?  not limited    How long can you stand comfortably?  2 minutes due to fatigue    How long can you walk comfortably?  4-5 minutes with RW    Patient Stated Goals  Would like to come to clinic to build up her strength and endurance    Currently in Pain?  Yes    Pain Score  4     Pain Location  Hip    Pain Orientation  Right;Left    Pain Descriptors / Indicators  Aching    Pain Type  Acute pain;Chronic pain    Pain Onset  More than a month ago    Pain Frequency  Constant    Aggravating Factors   not sure but she thinks the hormonal therapy that she is taking is aggrevating it      Pain Relieving Factors  rest     Effect of Pain on Daily Activities  limit                        [facility] Adult PT Treatment/Exercise - 05/15/18 0001      Ambulation/Gait   Ambulation Distance (Feet)  130 Feet    Assistive device  Straight cane  Gait Pattern  Step-through pattern;Decreased hip/knee flexion - left;Decreased stride length;Trunk flexed      Exercises   Exercises  Knee/Hip      Knee/Hip Exercises: Stretches   Other Knee/Hip Stretches  back extension x 5       Knee/Hip Exercises: Machines for Strengthening   Other Machine  bodycraft hip extension 3Pl x 10 reps       Knee/Hip Exercises: Standing   Heel Raises  Both;10 reps    Forward Lunges  Both;5 reps    Functional Squat  10 reps    SLS  5 x each       Knee/Hip Exercises: Seated   Long Arc Quad  Both;10 reps;Weights    Long Arc Quad Weight  4 lbs.    Sit to Sand  10 reps;with UE support      Knee/Hip Exercises: Supine   Bridges               PT Short Term Goals - 05/15/18 1146      PT SHORT TERM GOAL #1   Title  Patient and caregivers will be independent in initial HEP with routine performance.     Time  3    Period  Weeks    Status  On-going      PT SHORT TERM GOAL #2   Title  Patient with tolerate the standing position with upper extremity support for 5 minutes before needing to rest to improve her ability to perform standing functional activities and ADLs within her home environment.    Time  3    Period  Weeks    Status  On-going      PT SHORT TERM GOAL #3   Title  Patient will increase MMT grades by 1/2 grade in deficient musculature to show improvement in her strength and stability to perform functional activities.    Time  3    Period  Weeks    Status  On-going      PT SHORT TERM GOAL #4   Title  Patient will be able to stand on either foot for 12 seconds to exhibit improved balance and decreased risk for falls.    Time  3    Period  Weeks    Status  On-going         PT Long Term Goals - 05/15/18 1146      PT LONG TERM GOAL #1   Title  Patient will ambulate >300 feet with LRAD during the 2 MWT to improve her ability to ambulate further in community to allow to her go enjoy going out for a meal.    Time  6    Period  Weeks    Status  On-going      PT LONG TERM GOAL #2   Title  Patient with tolerate the standing position with upper extremity support for 10 minutes before needing to rest to improve her ability to stand and have a conversation with a friend or relative.    Time  6    Period  Weeks    Status  On-going      PT LONG TERM GOAL #3   Title  Patient will increase MMT grades by 1 grade in deficent musculature to show continued improvement in her strength and stability to perform functional activities at home independently.    Time  6    Period  Weeks    Status  On-going      PT LONG TERM  GOAL #4   Title  Patient will be able to stand on either foot for 16 seconds to exhibit improved balance and further decreased risk for falls.    Time  6    Period  Weeks    Status  On-going      PT LONG TERM GOAL #5   Title  Pt will be able to perform 5xSTS in 12 sec or < with no UE in order to demo improved functional BLE strength and overall balance.    Time  6    Period  Weeks    Status  On-going      PT LONG TERM GOAL #6   Title  Pt will report requiring assistance for ADLs and IADLs at home 25% of the time or < to demonstrate improved overall functional strength, endurance, and balance in order to promote independence at home.    Time  6    Period  Weeks    Status  On-going            Plan - 05/15/18 1144    Clinical Impression Statement  Reviewed pt evaluation.  Gave pt a HEP as she will be gone for the next week at the beach.  PT has hip pain which is aggrevated with exercises needing multiple rest breaks.     Rehab Potential  Good    PT Frequency  2x / week    PT Duration  6 weeks    PT Treatment/Interventions  ADLs/Self  Care Home Management;Gait training;Stair training;Functional mobility training;Therapeutic activities;Patient/family education;Balance training;Neuromuscular re-education;Therapeutic exercise;Manual techniques;Energy conservation    PT Next Visit Plan  Progress closed chain exercises as able; continue balance activity.     Consulted and Agree with Plan of Care  Patient       Patient will benefit from skilled therapeutic intervention in order to improve the following deficits and impairments:  Abnormal gait, Pain, Decreased mobility, Decreased coordination, Decreased activity tolerance, Decreased endurance, Decreased strength, Decreased balance, Difficulty walking  Visit Diagnosis: Muscle weakness (generalized)  Other abnormalities of gait and mobility  Other symptoms and signs involving the musculoskeletal system  Pain in left lower leg  Difficulty in walking, not elsewhere classified     Problem List Patient Active Problem List   Diagnosis Date Noted  . Psoriatic arthritis ([place]) 04/22/2018  . Genetic testing 04/07/2018  . Family history of colon cancer   . History of GI diverticular bleed 03/12/2018  . Lower GI bleed   . Diverticulosis of colon with hemorrhage   . Rectal bleeding 03/04/2018  . Acute GI bleeding 03/04/2018  . Thrombocytopenia ([place]) 01/03/2018  . Anemia, chronic disease 12/13/2017  . Mild protein-calorie malnutrition ([place]) 12/13/2017  . Peripheral neuropathy due to chemotherapy ([place]) 12/13/2017  . S/P thoracentesis   . Atrial fibrillation with RVR ([place]) 11/08/2017  . HCAP (healthcare-associated pneumonia) 11/08/2017  . Pleural effusion 09/20/2017  . Acute on chronic diastolic heart failure ([place]) 08/19/2017  . Pancytopenia, acquired ([place]) 08/15/2017  . Protein-calorie malnutrition, moderate ([place]) 07/30/2017  . Generalized weakness 07/30/2017  . Antineoplastic chemotherapy induced pancytopenia ([place]) 07/20/2017  . Left ovarian epithelial cancer ([place])  07/18/2017  . PNA (pneumonia) 07/07/2017  . Ascites 06/30/2017  . Ascites, malignant 06/30/2017  . Elevated CA-125 01/18/2017  . Depression 12/23/2015  . Peripheral edema 08/23/2015  . Fracture of hip, left, closed ([place]) 12/08/2013  . Hip fracture ([place]) 12/08/2013  . PAD (peripheral artery disease) ([place]) 11/25/2013  . Encounter for therapeutic drug monitoring 11/09/2013  .  Cough 03/20/2013  . Total knee replacement status 10/07/2012  . Knee pain 10/07/2012  . Knee stiffness 10/07/2012  . Tachycardia 09/17/2012  . Chest pain 09/17/2012  . Difficulty in walking(719.7) 09/16/2012  . Muscle weakness (generalized) 09/16/2012  . Postop Acute blood loss anemia 08/08/2012  . Instability of prosthetic knee ([place]) 08/06/2012  . Bloating 02/14/2012  . Upper abdominal pain 02/14/2012  . Allergic rhinitis, seasonal 02/15/2011  . COLITIS 02/20/2010  . Diarrhea 01/02/2010  . ABDOMINAL PAIN -GENERALIZED 01/02/2010  . PERSONAL HX COLONIC POLYPS 01/02/2010  . Hypothyroidism 12/28/2009  . COPD (chronic obstructive pulmonary disease) ([place]) 12/28/2009  . ARTHRITIS 12/28/2009    Rayetta Humphrey, PT CLT [phone] 05/15/2018, 12:04 PM  [place] [address] [place], [place], [address] Phone: [phone]   Fax:  [phone]  Name: [name] MRN: [id number] Date of Birth: 10/26/1932

## 2018-05-22 ENCOUNTER — Ambulatory Visit: Payer: Self-pay | Admitting: Cardiology

## 2018-05-27 ENCOUNTER — Ambulatory Visit (HOSPITAL_COMMUNITY): Payer: Medicare Other | Admitting: Physical Therapy

## 2018-05-27 DIAGNOSIS — R262 Difficulty in walking, not elsewhere classified: Secondary | ICD-10-CM

## 2018-05-27 DIAGNOSIS — M6281 Muscle weakness (generalized): Secondary | ICD-10-CM | POA: Diagnosis not present

## 2018-05-27 DIAGNOSIS — M79662 Pain in left lower leg: Secondary | ICD-10-CM

## 2018-05-27 DIAGNOSIS — R29898 Other symptoms and signs involving the musculoskeletal system: Secondary | ICD-10-CM

## 2018-05-27 DIAGNOSIS — R2689 Other abnormalities of gait and mobility: Secondary | ICD-10-CM

## 2018-05-27 NOTE — Therapy (Signed)
[facility] [place], [place], [address] Phone: [phone]   Fax:  [phone]  Physical Therapy Treatment  Patient Details  Name: [name] MRN: [id number] Date of Birth: 12-09-1932 Referring Provider: Dr Heath Lark   Encounter Date: 05/27/2018  PT End of Session - 05/27/18 1200    Visit Number  3    Number of Visits  12    Date for PT Re-Evaluation  06/25/18   mini-re-assess 06/04/2018   Authorization Type  Medicare; secondary - Generic Commercial    Authorization Time Period  05/14/18-06/25/2018    Authorization - Visit Number  3    Authorization - Number of Visits  10    PT Start Time  1118    PT Stop Time  1202    PT Time Calculation (min)  44 min    Equipment Utilized During Treatment  Gait belt    Activity Tolerance  Patient tolerated treatment well    Behavior During Therapy  [facility] for tasks assessed/performed       Past Medical History:  Diagnosis Date  . Anxiety   . Chronic blood loss anemia    03-04-2018 diverticular bleed and rectal bleeding,  transfused 2 units PRBCs 03-08-2018  . COPD (chronic obstructive pulmonary disease) ([place])    pulmologist-  dr byrum  . Depression   . Diastolic CHF, chronic ([place])    followed by cardiology  . Diverticulosis   . Dyspnea on effort   . Family history of colon cancer   . Family hx of colon cancer   . Fibromyalgia   . Genetic testing 04/07/2018   MyRisk (35 genes) @ [facility] - No pathogenic mutations detected  . GERD (gastroesophageal reflux disease)    03-17-2018 per pt has not had any gerd issues in long time and no meds prn  . Hemorrhoids   . Hiatal hernia   . History of lower GI bleeding 03/04/2018   admission--  dx diverticular bleed and rectal bleed in setting of xarelto-- xarelto stopped and transfused 03-08-2018 2 units PRBCs  . History of rectal polyps   . History of shingles   . Hypothyroidism   . IBS (irritable bowel syndrome)   . Lymphocytic  colitis    FOLLOWED BY DR Henrene Pastor  . Malignant ascites    dx at admission 09/ 2018 abdominal s/p  parencentesis 07-01-2017 2.5L,  07-08-2016  2.7L,  07-12-2017  1430m  . Neuropathy due to chemotherapeutic drug ([place]   . Osteoporosis   . Ovarian cancer ([facility] oncologist-  gors[place]/  dr pGerarda Fraction  chemotherapy 07-19-2017 to 11-04-2017  . Paroxysmal atrial fibrillation ([place]    CARDIOLOGIST-  DR S. MCDOWELL-  first dx'd 07/ 2014---was taking xarelto up until 03-07-2018 stopped due to lower GI bleed  . Pleural effusion    s/p  right thoracentesis, 02-2018 1.3L and 03-17-2018 right thoracentesis 6465m, post cxr no residual effusion  . Psoriatic arthritis ([place]  . Schatzki's ring    S/P  DILATERAL 2013  . Seasonal allergic rhinitis     Past Surgical History:  Procedure Laterality Date  . CARDIOVASCULAR STRESS TEST  09/23/2012   Low risk lexiscan nuclear study w/ apical thinning but no evidence of ischemia/  normal LV function and wall motion , ef 75%  . CATARACT EXTRACTION W/ INTRAOCULAR LENS  IMPLANT, BILATERAL  10/2016  . COLONOSCOPY    . DEBULKING N/A 03/20/2018   Procedure: DEBULKING;  Surgeon: PhIsabel Caprice  MD;  Location: [facility];  Service: Gynecology;  Laterality: N/A;  . EXAM UNDER ANESTHESIA WITH MANIPULATION OF KNEE Left 12-20-2003  dr Noemi Chapel   post TKA  . FEMUR IM NAIL Left 12/11/2013   Procedure: INTRAMEDULLARY (IM) NAIL FEMORAL;  Surgeon: [name], MD;  Location: [facility];  Service: Orthopedics;  Laterality: Left;  . HYSTERECTOMY ABDOMINAL WITH SALPINGO-OOPHORECTOMY Bilateral 03/20/2018   Procedure: TOTAL HYSTERECTOMY ABDOMINAL WITH BILATERAL  SALPINGO-OOPHORECTOMY;  Surgeon: Isabel Caprice, MD;  Location: [facility];  Service: Gynecology;  Laterality: Bilateral;  . IR FLUORO GUIDE PORT INSERTION RIGHT  07/22/2017  . IR PARACENTESIS  07/12/2017  . IR THORACENTESIS ASP PLEURAL SPACE W/IMG GUIDE  03/17/2018  . IR THORACENTESIS ASP PLEURAL SPACE W/IMG GUIDE  05/08/2018  . IR US  GUIDE VASC ACCESS RIGHT  07/22/2017  . KNEE ARTHROSCOPY W/ LATERAL RELEASE Left 09-03-2005   dr Noemi Chapel  [facility]   w/  Lysis Adhesions,  excision loose body's  . LAPAROSCOPIC CHOLECYSTECTOMY  12-04-2010  dr zeigler  . LAPAROTOMY N/A 03/20/2018   Procedure: EXPLORATORY LAPAROTOMY;  Surgeon: Isabel Caprice, MD;  Location: [facility];  Service: Gynecology;  Laterality: N/A;  . OMENTECTOMY N/A 03/20/2018   Procedure: OMENTECTOMY;  Surgeon: Isabel Caprice, MD;  Location: [facility];  Service: Gynecology;  Laterality: N/A;  . TOTAL KNEE ARTHROPLASTY Left 09-08-2003   dr Noemi Chapel  [facility]  . TOTAL KNEE REVISION  08/06/2012   Procedure: TOTAL KNEE REVISION;  Surgeon: [name], MD;  Location: [facility];  Service: Orthopedics;  Laterality: Left;  Left Total Knee Arthroplasty Revision  . TRANSTHORACIC ECHOCARDIOGRAM  08/20/2017   ef 60-65%,  grade 1 diastolic dysfunction/  trivial AR and TR  . Uterine polypectomy      There were no vitals filed for this visit.  Subjective Assessment - 05/27/18 1121    Subjective  Pt states she had a good beach trip and completed her HEP while she was there.  Returns today using her RW.      Currently in Pain?  No/denies                       [facility] Adult PT Treatment/Exercise - 05/27/18 0001      Ambulation/Gait   Ambulation Distance (Feet)  210 Feet    Assistive device  Straight cane    Gait Pattern  Step-through pattern;Decreased hip/knee flexion - left;Decreased stride length;Trunk flexed      Knee/Hip Exercises: Standing   Heel Raises  Both;15 reps    Forward Lunges  Both;10 reps;Limitations    Forward Lunges Limitations  onto 4" box    Functional Squat  10 reps    SLS  5 x each    max of 10" Lt and 18" Rt   Gait Training  gait with SPC x228f max before fatigue due to SOB    Other Standing Knee Exercises  vectors 5X5" each LE with 1 HHA      Knee/Hip Exercises: Seated   Long Arc Quad  Both;Weights;15 reps    Long Arc Quad Weight  4 lbs.     Sit to Sand  10 reps;with UE support               PT Short Term Goals - 05/15/18 1146      PT SHORT TERM GOAL #1   Title  Patient and caregivers will be independent in initial HEP with routine performance.     Time  3    Period  Weeks    Status  On-going      PT SHORT TERM GOAL #2   Title  Patient with tolerate the standing position with upper extremity support for 5 minutes before needing to rest to improve her ability to perform standing functional activities and ADLs within her home environment.    Time  3    Period  Weeks    Status  On-going      PT SHORT TERM GOAL #3   Title  Patient will increase MMT grades by 1/2 grade in deficient musculature to show improvement in her strength and stability to perform functional activities.    Time  3    Period  Weeks    Status  On-going      PT SHORT TERM GOAL #4   Title  Patient will be able to stand on either foot for 12 seconds to exhibit improved balance and decreased risk for falls.    Time  3    Period  Weeks    Status  On-going        PT Long Term Goals - 05/15/18 1146      PT LONG TERM GOAL #1   Title  Patient will ambulate >300 feet with LRAD during the 2 MWT to improve her ability to ambulate further in community to allow to her go enjoy going out for a meal.    Time  6    Period  Weeks    Status  On-going      PT LONG TERM GOAL #2   Title  Patient with tolerate the standing position with upper extremity support for 10 minutes before needing to rest to improve her ability to stand and have a conversation with a friend or relative.    Time  6    Period  Weeks    Status  On-going      PT LONG TERM GOAL #3   Title  Patient will increase MMT grades by 1 grade in deficent musculature to show continued improvement in her strength and stability to perform functional activities at home independently.    Time  6    Period  Weeks    Status  On-going      PT LONG TERM GOAL #4   Title  Patient will be able to  stand on either foot for 16 seconds to exhibit improved balance and further decreased risk for falls.    Time  6    Period  Weeks    Status  On-going      PT LONG TERM GOAL #5   Title  Pt will be able to perform 5xSTS in 12 sec or < with no UE in order to demo improved functional BLE strength and overall balance.    Time  6    Period  Weeks    Status  On-going      PT LONG TERM GOAL #6   Title  Pt will report requiring assistance for ADLs and IADLs at home 25% of the time or < to demonstrate improved overall functional strength, endurance, and balance in order to promote independence at home.    Time  6    Period  Weeks    Status  On-going            Plan - 05/27/18 1200    Clinical Impression Statement  Continued to work on [employer] using SPC.  Pt able to complete without instabiltiy, however  fatigue with need to rest at 215 feet due to shortness of breath.  Worked on LE strengthening and stability exericses in standing.  Pt required 3 additional 1 minute rest breaks during session today due to fatigue.   tandem gait completed 30" with Lt leading, however max of 15 seconds with Rt leading due to weakness in LT.  Added vector stance to help improve stability as well.  pt reported general fatigue at end of session but no pain.      Rehab Potential  Good    PT Frequency  2x / week    PT Duration  6 weeks    PT Treatment/Interventions  ADLs/Self Care Home Management;Gait training;Stair training;Functional mobility training;Therapeutic activities;Patient/family education;Balance training;Neuromuscular re-education;Therapeutic exercise;Manual techniques;Energy conservation    PT Next Visit Plan  Progress closed chain exercises as able; continue balance activity.     Consulted and Agree with Plan of Care  Patient       Patient will benefit from skilled therapeutic intervention in order to improve the following deficits and impairments:  Abnormal gait, Pain, Decreased mobility, Decreased  coordination, Decreased activity tolerance, Decreased endurance, Decreased strength, Decreased balance, Difficulty walking  Visit Diagnosis: Muscle weakness (generalized)  Other abnormalities of gait and mobility  Other symptoms and signs involving the musculoskeletal system  Pain in left lower leg  Difficulty in walking, not elsewhere classified     Problem List Patient Active Problem List   Diagnosis Date Noted  . Psoriatic arthritis ([place]) 04/22/2018  . Genetic testing 04/07/2018  . Family history of colon cancer   . History of GI diverticular bleed 03/12/2018  . Lower GI bleed   . Diverticulosis of colon with hemorrhage   . Rectal bleeding 03/04/2018  . Acute GI bleeding 03/04/2018  . Thrombocytopenia ([place]) 01/03/2018  . Anemia, chronic disease 12/13/2017  . Mild protein-calorie malnutrition ([place]) 12/13/2017  . Peripheral neuropathy due to chemotherapy ([place]) 12/13/2017  . S/P thoracentesis   . Atrial fibrillation with RVR ([place]) 11/08/2017  . HCAP (healthcare-associated pneumonia) 11/08/2017  . Pleural effusion 09/20/2017  . Acute on chronic diastolic heart failure ([place]) 08/19/2017  . Pancytopenia, acquired ([place]) 08/15/2017  . Protein-calorie malnutrition, moderate ([place]) 07/30/2017  . Generalized weakness 07/30/2017  . Antineoplastic chemotherapy induced pancytopenia ([place]) 07/20/2017  . Left ovarian epithelial cancer ([place]) 07/18/2017  . PNA (pneumonia) 07/07/2017  . Ascites 06/30/2017  . Ascites, malignant 06/30/2017  . Elevated CA-125 01/18/2017  . Depression 12/23/2015  . Peripheral edema 08/23/2015  . Fracture of hip, left, closed ([place]) 12/08/2013  . Hip fracture ([place]) 12/08/2013  . PAD (peripheral artery disease) ([place]) 11/25/2013  . Encounter for therapeutic drug monitoring 11/09/2013  . Cough 03/20/2013  . Total knee replacement status 10/07/2012  . Knee pain 10/07/2012  . Knee stiffness 10/07/2012  . Tachycardia 09/17/2012  . Chest pain 09/17/2012  .  Difficulty in walking(719.7) 09/16/2012  . Muscle weakness (generalized) 09/16/2012  . Postop Acute blood loss anemia 08/08/2012  . Instability of prosthetic knee ([place]) 08/06/2012  . Bloating 02/14/2012  . Upper abdominal pain 02/14/2012  . Allergic rhinitis, seasonal 02/15/2011  . COLITIS 02/20/2010  . Diarrhea 01/02/2010  . ABDOMINAL PAIN -GENERALIZED 01/02/2010  . PERSONAL HX COLONIC POLYPS 01/02/2010  . Hypothyroidism 12/28/2009  . COPD (chronic obstructive pulmonary disease) ([place]) 12/28/2009  . ARTHRITIS 12/28/2009    Teena Irani 05/27/2018, 12:07 PM  [facility] [address] [place], [place], [address] Phone: [phone]   Fax:  [phone]  Name: MAGNOLIA MATTILA MRN: [id number] Date of Birth: 1933/02/20

## 2018-05-28 ENCOUNTER — Telehealth: Payer: Self-pay | Admitting: Hematology and Oncology

## 2018-05-28 ENCOUNTER — Encounter: Payer: Self-pay | Admitting: Hematology and Oncology

## 2018-05-28 ENCOUNTER — Inpatient Hospital Stay: Payer: Medicare Other

## 2018-05-28 ENCOUNTER — Inpatient Hospital Stay: Payer: Medicare Other | Attending: Hematology and Oncology

## 2018-05-28 ENCOUNTER — Inpatient Hospital Stay (HOSPITAL_BASED_OUTPATIENT_CLINIC_OR_DEPARTMENT_OTHER): Payer: Medicare Other | Admitting: Hematology and Oncology

## 2018-05-28 ENCOUNTER — Ambulatory Visit: Payer: Self-pay | Admitting: Emergency Medicine

## 2018-05-28 DIAGNOSIS — G8929 Other chronic pain: Secondary | ICD-10-CM

## 2018-05-28 DIAGNOSIS — I700 Atherosclerosis of aorta: Secondary | ICD-10-CM | POA: Insufficient documentation

## 2018-05-28 DIAGNOSIS — Z79899 Other long term (current) drug therapy: Secondary | ICD-10-CM | POA: Diagnosis not present

## 2018-05-28 DIAGNOSIS — C562 Malignant neoplasm of left ovary: Secondary | ICD-10-CM | POA: Diagnosis not present

## 2018-05-28 DIAGNOSIS — K5909 Other constipation: Secondary | ICD-10-CM | POA: Diagnosis not present

## 2018-05-28 DIAGNOSIS — Z9981 Dependence on supplemental oxygen: Secondary | ICD-10-CM

## 2018-05-28 DIAGNOSIS — I509 Heart failure, unspecified: Secondary | ICD-10-CM

## 2018-05-28 DIAGNOSIS — J449 Chronic obstructive pulmonary disease, unspecified: Secondary | ICD-10-CM | POA: Insufficient documentation

## 2018-05-28 DIAGNOSIS — C569 Malignant neoplasm of unspecified ovary: Secondary | ICD-10-CM | POA: Diagnosis present

## 2018-05-28 DIAGNOSIS — J90 Pleural effusion, not elsewhere classified: Secondary | ICD-10-CM | POA: Diagnosis not present

## 2018-05-28 DIAGNOSIS — Z9221 Personal history of antineoplastic chemotherapy: Secondary | ICD-10-CM | POA: Insufficient documentation

## 2018-05-28 DIAGNOSIS — Z7901 Long term (current) use of anticoagulants: Secondary | ICD-10-CM

## 2018-05-28 DIAGNOSIS — L4050 Arthropathic psoriasis, unspecified: Secondary | ICD-10-CM | POA: Diagnosis not present

## 2018-05-28 DIAGNOSIS — N83201 Unspecified ovarian cyst, right side: Secondary | ICD-10-CM

## 2018-05-28 LAB — COMPREHENSIVE METABOLIC PANEL
ALT: 9 U/L (ref 0–44)
AST: 13 U/L — ABNORMAL LOW (ref 15–41)
Albumin: 3.4 g/dL — ABNORMAL LOW (ref 3.5–5.0)
Alkaline Phosphatase: 64 U/L (ref 38–126)
Anion gap: 9 (ref 5–15)
BUN: 15 mg/dL (ref 8–23)
CALCIUM: 9.3 mg/dL (ref 8.9–10.3)
CO2: 30 mmol/L (ref 22–32)
CREATININE: 1.04 mg/dL — AB (ref 0.44–1.00)
Chloride: 98 mmol/L (ref 98–111)
GFR calc non Af Amer: 48 mL/min — ABNORMAL LOW (ref >60)
GFR, EST AFRICAN AMERICAN: 56 mL/min — AB (ref >60)
Glucose, Bld: 98 mg/dL (ref 70–99)
Potassium: 4 mmol/L (ref 3.5–5.1)
Sodium: 137 mmol/L (ref 135–145)
Total Bilirubin: 0.3 mg/dL (ref 0.3–1.2)
Total Protein: 7.4 g/dL (ref 6.5–8.1)

## 2018-05-28 LAB — CBC WITH DIFFERENTIAL/PLATELET
BASOS ABS: 0.1 10*3/uL (ref 0.0–0.1)
BASOS PCT: 1 %
EOS ABS: 0.4 10*3/uL (ref 0.0–0.5)
Eosinophils Relative: 5 %
HCT: 35.7 % (ref 34.8–46.6)
HEMOGLOBIN: 11.9 g/dL (ref 11.6–15.9)
Lymphocytes Relative: 17 %
Lymphs Abs: 1.4 10*3/uL (ref 0.9–3.3)
MCH: 30.5 pg (ref 25.1–34.0)
MCHC: 33.2 g/dL (ref 31.5–36.0)
MCV: 91.9 fL (ref 79.5–101.0)
MONOS PCT: 8 %
Monocytes Absolute: 0.7 10*3/uL (ref 0.1–0.9)
NEUTROS PCT: 69 %
Neutro Abs: 5.7 10*3/uL (ref 1.5–6.5)
Platelets: 197 10*3/uL (ref 145–400)
RBC: 3.89 MIL/uL (ref 3.70–5.45)
RDW: 12.4 % (ref 11.2–14.5)
WBC: 8.2 10*3/uL (ref 3.9–10.3)

## 2018-05-28 MED ORDER — SODIUM CHLORIDE 0.9% FLUSH
10.0000 mL | Freq: Once | INTRAVENOUS | Status: AC
  Administered 2018-05-28: 10 mL
  Filled 2018-05-28: qty 10

## 2018-05-28 MED ORDER — HEPARIN SOD (PORK) LOCK FLUSH 100 UNIT/ML IV SOLN
500.0000 [IU] | Freq: Once | INTRAVENOUS | Status: AC
  Administered 2018-05-28: 500 [IU]
  Filled 2018-05-28: qty 5

## 2018-05-28 MED ORDER — MORPHINE SULFATE ER 15 MG PO TBCR: 15.0000 mg | EXTENDED_RELEASE_TABLET | Freq: Two times a day (BID) | ORAL | 0 refills | Status: DC

## 2018-05-28 MED FILL — MORPHINE SULF ER 15 MG TAB: 15 | 30 days supply | Qty: 60 | Fill #0

## 2018-05-28 NOTE — Telephone Encounter (Signed)
Gave avs and calendar ° °

## 2018-05-28 NOTE — Assessment & Plan Note (Signed)
She had constipation likely secondary to narcotic prescription We discussed importance of laxative therapy

## 2018-05-28 NOTE — Progress Notes (Signed)
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 Repeated fluid cytology was negative for malignancy She has occasional cough and intermittent shortness of breath She has appointment to see pulmonologist and I will defer to them for further management  Psoriatic arthritis [facility]) She has chronic joint pain secondary to psoriatic arthritis She has seen a rheumatologist and was prescribed treatment for this I refilled her prescription pain medicine and warned her about risk of constipation  Other constipation She had constipation likely secondary to narcotic prescription We discussed importance of laxative therapy   No orders of the defined types were placed in this encounter.   INTERVAL HISTORY: Please see below for problem oriented charting. 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 Interim finding of moderate ascites within the abdomen and pelvis with additional finding of diffuse nodular infiltration of the omentum and anterior mesenteric fat, the appearance would be consistent with peritoneal carcinomatosis/metastatic disease. Increasing retroperitoneal and upper abdominal adenopathy. 2. Re- demonstrated 3.8 cm cyst in the right adnexa. Enlarging soft tissue density in the left adnexa now with possible cystic component posteriorly. In light of the above findings, concern is for ovarian neoplasm. Correlation with pelvic ultrasound recommended. 3. Small right-sided pleural effusion, new since prior study 4. Stable hypodense splenic lesions since 2017.     06/25/2017 Imaging    US pelvis: 2.9 cm simple appearing cyst in the right ovary. Left ovary grossly unremarkable. Large volume ascites in the pelvis    06/30/2017 - 07/01/2017 Hospital Admission    She was admitted for evaluation of abdominal pain and ascites    07/01/2017 Pathology Results    PERITONEAL/ASCITIC FLUID(SPECIMEN 1 OF 1 COLLECTED 07/01/17): - POORLY DIFFERENTIATED CARCINOMA; SEE COMMENT Source Peritoneal/Ascitic Fluid, (specimen 1 of 1 collected 07/01/17) Gross Specimen: Received is/are 1000  cc's of brownish fluid. (BS:bs) Prepared: # Smears: 0 # Concentration Technique Slides (i.e. ThinPrep): 1 # Cell  Block: 1 Additional Studies: Also received Hematology slide - [id number]. Comment The tumor cells are positive for cytokeratin 7 and Pax-8 but negative for cytokeratin 20, CDX-2, GATA-3, Napsin-A and TTF-1. Based on the immunoprofile a gynecology primary is favored    07/01/2017 Procedure    Successful ultrasound-guided diagnostic and therapeutic paracentesis yielding 2.5 liters of peritoneal fluid    07/07/2017 - 07/09/2017 Hospital Admission    She was admitted for management of malignant ascites    07/08/2017 Procedure    Successful ultrasound-guided therapeutic paracentesis yielding 2.7 liters liters of peritoneal fluid    07/12/2017 Procedure    Successful ultrasound-guided paracentesis yielding 1450 mL of peritoneal fluid    07/18/2017 - 07/24/2017 Hospital Admission    She was admitted for expedited treatment    07/18/2017 Tumor Marker    Patient's tumor was tested for the following markers: CA125 Results of the tumor marker test revealed 1941    07/19/2017 - 02/04/2018 Chemotherapy    The patient had 6 cycles of carboplatin & Taxol for chemotherapy treatment, followed by 3 more cycles of carboplatin only     08/06/2017 Procedure    Successful ultrasound-guided therapeutic paracentesis yielding 2.6 liters of peritoneal fluid.    08/09/2017 Tumor Marker    Patient's tumor was tested for the following markers: CA125 Results of the tumor marker test revealed 1665    08/15/2017 Tumor Marker    Patient's tumor was tested for the following markers: CA125 Results of the tumor marker test revealed 937.9    08/20/2017 Imaging    ECHO: Normal LV size with EF 60-65%. Normal RV size and systolic function. No significant valvular abnormalities.    09/18/2017 Imaging    Chest Impression:  1. No evidence thoracic metastasis. 2. Interval increase and RIGHT pleural effusion.  Abdomen / Pelvis Impression:  1. Interval decrease in intraperitoneal free fluid. 2. Interval decrease in omental  nodularity in the LEFT ventral peritoneal space. 3. Interval decrease in nodularity associated with the LEFT ovary. 4. Cystic portion of the RIGHT ovary is increased mildly in size.    09/20/2017 Tumor Marker    Patient's tumor was tested for the following markers: CA125 Results of the tumor marker test revealed 347    10/14/2017 Tumor Marker    Patient's tumor was tested for the following markers: CA125 Results of the tumor marker test revealed 307.4    11/04/2017 Tumor Marker    Patient's tumor was tested for the following markers: CA125 Results of the tumor marker test revealed 262.5    11/28/2017 Imaging    1. Interval decrease in right pleural effusion with resolution of right atelectasis seen previously. 2. New small left pleural effusion, symmetric to the right. 3. No intraperitoneal free fluid on the current study. 4. Continued further decrease in left omental disease, appearing less confluent today than on the prior study. 5. Left ovary remains normal in appearance today and the right adnexal cystic lesion is decreased in size compared to prior study. 6. 14 mm pancreatic cyst is unchanged. Continued attention on follow-up imaging recommended. 7. Aortic Atherosclerois (ICD10-170.0)    12/13/2017 Tumor Marker    Patient's tumor was tested for the following markers: CA125 Results of the tumor marker test revealed 197.7    01/03/2018 Tumor Marker    Patient's tumor was tested for the following markers: CA125 Results of the tumor marker test revealed  183.1    01/14/2018 Tumor Marker    Patient's tumor was tested for the following markers: CA125 Results of the tumor marker test revealed 177.4    02/04/2018 Tumor Marker    Patient's tumor was tested for the following markers: CA125 Results of the tumor marker test revealed 168.5    02/25/2018 Imaging    1. Omental carcinomatosis appears qualitatively stable to slightly decreased. Stable mild peritoneal thickening in the paracolic  gutters. Stable right adnexal cyst. No ascites. No new or progressive metastatic disease in the abdomen or pelvis. 2. Small dependent right pleural effusion is increased. 3. Cystic pancreatic body lesion is decreased and now subcentimeter in size, suggesting a benign lesion. 4. Aortic Atherosclerosis (ICD10-I70.0).    03/03/2018 - 03/07/2018 Hospital Admission    She was hospitalized for GI bleed requiring blood transfusions. Xarelto was placed on hold    03/07/2018 PET scan    1. Persistent hazy omental interstitial nodularity but no hypermetabolism or discrete measurable nodules. No abdominal ascites. 2. No findings for metastatic disease involving the chest. 3. Moderate-sized right pleural effusion and small left pleural effusion.     03/20/2018 Pathology Results    1. Ovary and fallopian tube, right - OVARY AND FALLOPIAN TUBE INVOLVED BY SEROUS CARCINOMA. - PARATUBAL CYST. 2. Uterus +/- tubes/ovaries, neoplastic, cervix, left ovary and fallopian tube - LEFT OVARY: HIGH GRADE SEROUS CARCINOMA WITH TREATMENT EFFECT, SPANNING 2.5 CM. CARCINOMA INVOLVES OVARIAN SURFACE. SEE ONCOLOGY TABLE. - LEFT FALLOPIAN TUBE: INVOLVED BY SEROUS CARCINOMA. - UTERUS: -ENDOMETRIUM: INACTIVE ENDOMETRIUM. NO HYPERPLASIA OR MALIGNANCY. -MYOMETRIUM: UNREMARKABLE. NO MALIGNANCY. -SEROSA: INVOLVED BY SEROUS CARCINOMA. - CERVIX: ENDOCERVICAL POLYP. NO MALIGNANCY. 3. Omentum, resection for tumor - INVOLVED BY SEROUS CARCINOMA. 4. Soft tissue, biopsy, mesenteric nodule - INVOLVED BY SEROUS CARCINOMA. Microscopic Comment 2. OVARY or FALLOPIAN TUBE or PRIMARY PERITONEUM: Procedure: Total hysterectomy and bilateral salpingo-oophorectomy. Omentectomy. Mesenteric lymph node biopsy. Specimen Integrity: Intact. Tumor Site: Left ovary. Ovarian Surface Involvement (required only if applicable): Present. Fallopian Tube Surface Involvement (required only if applicable): Present, bilateral. Tumor Size: 2.5  cm. Histologic Type: High grade serous carcinoma. Histologic Grade: High grade. Implants (required for advanced stage serous/seromucinous borderline tumors only): N/A. Other Tissue/ Organ Involvement: Bilateral fallopian tubes, right ovary, uterine serosa, omentum. Largest Extrapelvic Peritoneal Focus (required only if applicable): Microscopic, estimated 0.5 cm (omentum). Peritoneal/Ascitic Fluid: Prior Positive ([id number]). Treatment Effect (required only for high-grade serous carcinomas): Present in left ovary. CRS2. Regional Lymph Nodes: No lymph nodes submitted/identified. Pathologic Stage Classification (pTNM, AJCC 8th Edition): ypT3b, ypNX Representative Tumor Block: 1A, 1B, 23F, 29F. Comment(s): The right ovary has only surface deposits with a large paratubal cyst. The left ovary has intraparenchymal tumor with associated treatment effect. Thus the tumor location is classified as a left ovarian primary.    03/20/2018 Surgery    Procedure(s) Performed:  1. Exploratory laparotomy with total hysterectomy and bilateral salpingo-oophorectomy 2. Infragastic Omentectomy  3. Debulking to <1cm gross residual disease   Surgeon: Mart Piggs, MD  Specimens: Uterus Cervix, Bilateral tubes / ovaries and omentum. Mesenteric nodule.  Operative Findings: Debulked to gross residual disease <1cm; however there is miliary disease in multiple locations including the majority of the abdominal peritoneum (anterior abdominal wall, bilateral gutters), diaphragm (Right>left), majority of small bowel mesentary. Normal appendix. Normal small uterus. Right ovary with a cystic lesion ~3cm, some adhesive disease of right adnexa to rectum/sigmoid. Gross omental disease, which was resected with the omentectomy. Smooth liver surface, but again, diaphragmatic disease noted.  03/20/2018 Genetic Testing    Patient has genetic testing done for ER/PR. Results revealed patient has ER: 90%, PR 0%.      03/31/2018 Tumor Marker    Patient's tumor was tested for the following markers: CA125 Results of the tumor marker test revealed 215.8     Genetic Testing    Patient has genetic testing done for BRCA 1. Results revealed patient has the following: BRCA 1: no loss of expression.     Genetic Testing    Patient has genetic testing done for MMR . Results revealed patient has the following:  MMR: normal    03/31/2018 Genetic Testing    Patient has genetic testing done for BRCA1/2. Results revealed patient has no actionable mutations. She is found to have Greene genetic change of unknown significance    04/21/2018 Imaging    1. No definite findings of residual or recurrent metastatic disease in the abdomen or pelvis status post interval TAHBSO and omentectomy. Stable minimal thickening in the paracolic gutters without discrete peritoneal nodularity. 2. Trace free fluid in the pelvic cul-de-sac. 3. Stable small dependent right pleural effusion. 4. Subcentimeter pancreatic body cystic lesion is stable to slightly decreased. 5. Aortic Atherosclerosis (ICD10-I70.0).    04/21/2018 Tumor Marker    Patient's tumor was tested for the following markers: CA125 Results of the tumor marker test revealed 187.3    04/23/2018 -  Anti-estrogen oral therapy    She is placed on Femara    05/08/2018 Procedure    Successful ultrasound guided right thoracentesis yielding 800 mL of pleural fluid. Fluid cytology is negative for malignancy      REVIEW OF SYSTEMS:   Constitutional: Denies fevers, chills or abnormal weight loss Eyes: Denies blurriness of vision Ears, nose, mouth, throat, and face: Denies mucositis or sore throat Cardiovascular: Denies palpitation, chest discomfort or lower extremity swelling Skin: Denies abnormal skin rashes Lymphatics: Denies new lymphadenopathy or easy bruising Behavioral/Psych: Mood is stable, no new changes  All other systems were reviewed with the patient and are  negative.  I have reviewed the past medical history, past surgical history, social history and family history with the patient and they are unchanged from previous note.  ALLERGIES:  is allergic to codeine.  MEDICATIONS:  Current Outpatient Medications  Medication Sig Dispense Refill  . albuterol (PROAIR HFA) 108 (90 Base) MCG/ACT inhaler Inhale 2 puffs into the lungs every 6 (six) hours as needed for wheezing or shortness of breath. 1 Inhaler 3  . ALPRAZolam (XANAX) 0.25 MG tablet Take 1 tablet (0.25 mg total) by mouth at bedtime as needed for anxiety. 5 tablet 0  . azelastine (ASTELIN) 0.1 % nasal spray Place 2 sprays into both nostrils 2 (two) times daily as needed for allergies.     . Cholecalciferol (VITAMIN D) 2000 units CAPS Take 2,000 Units by mouth daily.    Marland Kitchen dicyclomine (BENTYL) 10 MG capsule Take 1 tab by mouth every morning. May take twice daily as needed. 60 capsule 6  . diltiazem (CARDIZEM CD) 120 MG 24 hr capsule Take 1 capsule (120 mg total) by mouth every evening. 90 capsule 3  . diltiazem (CARDIZEM CD) 180 MG 24 hr capsule Take 1 capsule (180 mg total) by mouth every morning. 90 capsule 3  . diphenhydrAMINE (BENADRYL) 25 mg capsule Take 25 mg by mouth every 6 (six) hours as needed for itching.    . diphenoxylate-atropine (LOMOTIL) 2.5-0.025 MG tablet Take 1 tablet by mouth 4 (four) times daily. (Patient taking  differently: Take 1 tablet by mouth 4 (four) times daily as needed for diarrhea or loose stools. ) 360 tablet 1  . DULoxetine (CYMBALTA) 30 MG capsule TAKE 1 CAPSULE(30 MG) BY MOUTH DAILY AT NIGHT 30 capsule 0  . fluticasone (FLONASE) 50 MCG/ACT nasal spray Place 2 sprays into both nostrils 2 (two) times daily as needed (FOR NASAL CONGESTION.).     Marland Kitchen furosemide (LASIX) 20 MG tablet Take 1 tablet (20 mg total) by mouth daily. 90 tablet 9  . letrozole (FEMARA) 2.5 MG tablet Take 1 tablet (2.5 mg total) by mouth daily. 30 tablet 3  . levothyroxine (SYNTHROID, LEVOTHROID)  137 MCG tablet Take 137 mcg by mouth daily before breakfast. For thyroid therapy    . lidocaine-prilocaine (EMLA) cream Apply 1 application topically as needed. (Patient taking differently: Apply 1 application topically as needed (port). ) 30 g 6  . mirtazapine (REMERON) 15 MG tablet TAKE 1 TABLET(15 MG) BY MOUTH AT BEDTIME 30 tablet 11  . morphine (MS CONTIN) 15 MG 12 hr tablet Take 1 tablet (15 mg total) by mouth every 12 (twelve) hours. 60 tablet 0  . ondansetron (ZOFRAN) 8 MG tablet Take 8 mg by mouth every 8 (eight) hours as needed for nausea or vomiting.    . polyvinyl alcohol (LIQUIFILM TEARS) 1.4 % ophthalmic solution 1 drop 2 (two) times daily as needed for dry eyes.    . potassium chloride (K-DUR) 10 MEQ tablet Take 10 mEq by mouth daily.    . rivaroxaban (XARELTO) 20 MG TABS tablet Take 20 mg by mouth daily with supper.    . Tiotropium Bromide-Olodaterol (STIOLTO RESPIMAT) 2.5-2.5 MCG/ACT AERS Inhale 2 puffs into the lungs daily. 1 Inhaler 3   No current facility-administered medications for this visit.     PHYSICAL EXAMINATION: ECOG PERFORMANCE STATUS: 2 - Symptomatic, <50% confined to bed  Vitals:   05/28/18 1048  BP: (!) 146/65  Pulse: (!) 106  Resp: 18  Temp: 98.1 F (36.7 C)  SpO2: 94%   Filed Weights   05/28/18 1048  Weight: 166 lb 12.8 oz (75.7 kg)    GENERAL:alert, no distress and comfortable SKIN: skin color, texture, turgor are normal, no rashes or significant lesions EYES: normal, Conjunctiva are pink and non-injected, sclera clear OROPHARYNX:no exudate, no erythema and lips, buccal mucosa, and tongue normal  NECK: supple, thyroid normal size, non-tender, without nodularity LYMPH:  no palpable lymphadenopathy in the cervical, axillary or inguinal LUNGS: She has reduced breath sound on the right lung base  hEART: regular rate & rhythm and no murmurs and no lower extremity edema ABDOMEN:abdomen soft, non-tender and normal bowel sounds Musculoskeletal:no  cyanosis of digits and no clubbing  NEURO: alert & oriented x 3 with fluent speech, no focal motor/sensory deficits  LABORATORY DATA:  I have reviewed the data as listed    Component Value Date/Time   NA 137 05/28/2018 1021   NA 135 (L) 09/24/2017 0945   K 4.0 05/28/2018 1021   K 3.7 09/24/2017 0945   CL 98 05/28/2018 1021   CO2 30 05/28/2018 1021   CO2 30 (H) 09/24/2017 0945   GLUCOSE 98 05/28/2018 1021   GLUCOSE 101 09/24/2017 0945   BUN 15 05/28/2018 1021   BUN 15.3 09/24/2017 0945   CREATININE 1.04 (H) 05/28/2018 1021   CREATININE 1.00 03/13/2018 0940   CREATININE 0.8 09/24/2017 0945   CALCIUM 9.3 05/28/2018 1021   CALCIUM 9.1 09/24/2017 0945   PROT 7.4 05/28/2018 1021   PROT  6.6 09/24/2017 0945   ALBUMIN 3.4 (L) 05/28/2018 1021   ALBUMIN 3.3 (L) 09/24/2017 0945   AST 13 (L) 05/28/2018 1021   AST 14 03/13/2018 0940   AST 16 09/24/2017 0945   ALT 9 05/28/2018 1021   ALT 7 03/13/2018 0940   ALT 12 09/24/2017 0945   ALKPHOS 64 05/28/2018 1021   ALKPHOS 52 09/24/2017 0945   BILITOT 0.3 05/28/2018 1021   BILITOT 0.3 03/13/2018 0940   BILITOT 1.13 09/24/2017 0945   GFRNONAA 48 (L) 05/28/2018 1021   GFRNONAA 51 (L) 03/13/2018 0940   GFRAA 56 (L) 05/28/2018 1021   GFRAA 59 (L) 03/13/2018 0940    No results found for: SPEP, UPEP  Lab Results  Component Value Date   WBC 8.2 05/28/2018   NEUTROABS 5.7 05/28/2018   HGB 11.9 05/28/2018   HCT 35.7 05/28/2018   MCV 91.9 05/28/2018   PLT 197 05/28/2018      Chemistry      Component Value Date/Time   NA 137 05/28/2018 1021   NA 135 (L) 09/24/2017 0945   K 4.0 05/28/2018 1021   K 3.7 09/24/2017 0945   CL 98 05/28/2018 1021   CO2 30 05/28/2018 1021   CO2 30 (H) 09/24/2017 0945   BUN 15 05/28/2018 1021   BUN 15.3 09/24/2017 0945   CREATININE 1.04 (H) 05/28/2018 1021   CREATININE 1.00 03/13/2018 0940   CREATININE 0.8 09/24/2017 0945      Component Value Date/Time   CALCIUM 9.3 05/28/2018 1021   CALCIUM 9.1  09/24/2017 0945   ALKPHOS 64 05/28/2018 1021   ALKPHOS 52 09/24/2017 0945   AST 13 (L) 05/28/2018 1021   AST 14 03/13/2018 0940   AST 16 09/24/2017 0945   ALT 9 05/28/2018 1021   ALT 7 03/13/2018 0940   ALT 12 09/24/2017 0945   BILITOT 0.3 05/28/2018 1021   BILITOT 0.3 03/13/2018 0940   BILITOT 1.13 09/24/2017 0945       RADIOGRAPHIC STUDIES: I have personally reviewed the radiological images as listed and agreed with the findings in the report. Dg Chest 1 View  Result Date: 05/08/2018 CLINICAL DATA:  Post thoracentesis EXAM: CHEST  1 VIEW COMPARISON:  05/02/2018 FINDINGS: The right pleural effusion has resolved after thoracentesis. There is no ensuing pneumothorax. Right jugular Port-A-Cath is stable. Subsegmental atelectasis at the lung bases is noted. Normal heart size. IMPRESSION: No pneumothorax post right thoracentesis. Electronically Signed   By: Marybelle Killings M.D.   On: 05/08/2018 11:09   Dg Chest 2 View  Result Date: 05/02/2018 CLINICAL DATA:  Shortness of breath and cough. Right pleural effusion. EXAM: CHEST - 2 VIEW COMPARISON:  Chest x-ray dated March 31, 2018. FINDINGS: Unchanged right chest wall port catheter with the tip at the cavoatrial junction. The heart size and mediastinal contours are within normal limits. Normal pulmonary vascularity. Atherosclerotic calcification of the aortic arch. The lungs remain hyperinflated with emphysematous changes. Slight interval increase in size of the small right pleural effusion with adjacent right basilar atelectasis. The left lung is clear. No pneumothorax. No acute osseous abnormality. IMPRESSION: 1. Slight interval increase in size of small right pleural effusion. Electronically Signed   By: Titus Dubin M.D.   On: 05/02/2018 16:52   Ir Thoracentesis Asp Pleural Space W/img Guide  Result Date: 05/08/2018 INDICATION: Recurrent right sided pleural effusion. History of COPD, oxygen dependent, CHF. Currently being treated for ovarian  cancer. Request for diagnostic and therapeutic thoracentesis. EXAM: ULTRASOUND GUIDED RIGHT  THORACENTESIS MEDICATIONS: 10 mL 2% lidocaine. COMPLICATIONS: None immediate. PROCEDURE: An ultrasound guided thoracentesis was thoroughly discussed with the patient and questions answered. The benefits, risks, alternatives and complications were also discussed. The patient understands and wishes to proceed with the procedure. Written consent was obtained. Ultrasound was performed to localize and mark an adequate pocket of fluid in the right chest. The area was then prepped and draped in the normal sterile fashion. 2% Lidocaine was used for local anesthesia. Under ultrasound guidance a 6 Fr Safe-T-Centesis catheter was introduced. Thoracentesis was performed. The catheter was removed and a dressing applied. FINDINGS: A total of approximately 800 mL of amber fluid was removed. Samples were sent to the laboratory as requested by the clinical team. IMPRESSION: Successful ultrasound guided right thoracentesis yielding 800 mL of pleural fluid. Read by Candiss Norse, PA-C Electronically Signed   By: Markus Daft M.D.   On: 05/08/2018 11:23    All questions were answered. The patient knows to call the clinic with any problems, questions or concerns. No barriers to learning was detected.  I spent 15 minutes counseling the patient face to face. The total time spent in the appointment was 20 minutes and more than 50% was on counseling and review of test results  Heath Lark, MD 05/28/2018 1:40 PM

## 2018-05-28 NOTE — Assessment & Plan Note (Signed)
She has chronic joint pain secondary to psoriatic arthritis She has seen a rheumatologist and was prescribed treatment for this I refilled her prescription pain medicine and warned her about risk of constipation

## 2018-05-28 NOTE — Progress Notes (Signed)
Cardiology Office Note  Date: 05/29/2018   ID: [name], DOB 1933/08/24, MRN [id number]  PCP: Asencion Noble, MD  Primary Cardiologist: Rozann Lesches, MD   Chief Complaint  Patient presents with  . Atrial Fibrillation    History of Present Illness: [name] is a medically complex 84 y.o. female last seen by Ms. Strader PA-C in June. She presents today with family members for a follow-up visit.  From a cardiac perspective she does not report any progressive palpitations, has had no chest pain.  She remains on Lasix and weight has been stable overall.  She also continues to follow-up with Pulmonary for COPD and recurring right pleural effusion that has required thoracentesis.  She has had an intermittent cough but no progressive shortness of breath or orthopnea.  She is also following closely with Oncology.  I reviewed her recent lab work which is outlined below.  He was off Xarelto for a period of time following diverticular bleed, however this was resumed in June based on chart review.  Fortunately, she states that she has been tolerating this well, no obvious bleeding or stool changes.  Her most recent hemoglobin is up to 11.9.  Heart rate control regimen has been with Cardizem CD dosed twice daily.  Past Medical History:  Diagnosis Date  . Anxiety   . Chronic blood loss anemia    03-04-2018 diverticular bleed and rectal bleeding,  transfused 2 units PRBCs 03-08-2018  . COPD (chronic obstructive pulmonary disease) ([place])    pulmologist-  dr byrum  . Depression   . Diastolic CHF, chronic ([place])    followed by cardiology  . Diverticulosis   . Dyspnea on effort   . Family history of colon cancer   . Family hx of colon cancer   . Fibromyalgia   . Genetic testing 04/07/2018   MyRisk (35 genes) @ [facility] - No pathogenic mutations detected  . GERD (gastroesophageal reflux disease)    03-17-2018 per pt has not had any gerd issues in long time and no meds prn  .  Hemorrhoids   . Hiatal hernia   . History of lower GI bleeding 03/04/2018   admission--  dx diverticular bleed and rectal bleed in setting of xarelto-- xarelto stopped and transfused 03-08-2018 2 units PRBCs  . History of rectal polyps   . History of shingles   . Hypothyroidism   . IBS (irritable bowel syndrome)   . Lymphocytic colitis    FOLLOWED BY DR Henrene Pastor  . Malignant ascites    dx at admission 09/ 2018 abdominal s/p  parencentesis 07-01-2017 2.5L,  07-08-2016  2.7L,  07-12-2017  1456m  . Neuropathy due to chemotherapeutic drug ([place]   . Osteoporosis   . Ovarian cancer ([facility] oncologist-  [place]/  dr pGerarda Fraction  chemotherapy 07-19-2017 to 11-04-2017  . Paroxysmal atrial fibrillation ([place]    CARDIOLOGIST-  DR S. Khamarion Bjelland-  first dx'd 07/ 2014---was taking xarelto up until 03-07-2018 stopped due to lower GI bleed  . Pleural effusion    s/p  right thoracentesis, 02-2018 1.3L and 03-17-2018 right thoracentesis 6440m, post cxr no residual effusion  . Psoriatic arthritis ([place]  . Schatzki's ring    S/P  DILATERAL 2013  . Seasonal allergic rhinitis     Past Surgical History:  Procedure Laterality Date  . CARDIOVASCULAR STRESS TEST  09/23/2012   Low risk lexiscan nuclear study w/ apical thinning but no evidence of ischemia/  normal LV function and wall  motion , ef 75%  . CATARACT EXTRACTION W/ INTRAOCULAR LENS  IMPLANT, BILATERAL  10/2016  . COLONOSCOPY    . DEBULKING N/A 03/20/2018   Procedure: DEBULKING;  Surgeon: Isabel Caprice, MD;  Location: [facility];  Service: Gynecology;  Laterality: N/A;  . EXAM UNDER ANESTHESIA WITH MANIPULATION OF KNEE Left 12-20-2003  dr Noemi Chapel   post TKA  . FEMUR IM NAIL Left 12/11/2013   Procedure: INTRAMEDULLARY (IM) NAIL FEMORAL;  Surgeon: [name], MD;  Location: [facility];  Service: Orthopedics;  Laterality: Left;  . HYSTERECTOMY ABDOMINAL WITH SALPINGO-OOPHORECTOMY Bilateral 03/20/2018   Procedure: TOTAL HYSTERECTOMY ABDOMINAL WITH BILATERAL   SALPINGO-OOPHORECTOMY;  Surgeon: Isabel Caprice, MD;  Location: [facility];  Service: Gynecology;  Laterality: Bilateral;  . IR FLUORO GUIDE PORT INSERTION RIGHT  07/22/2017  . IR PARACENTESIS  07/12/2017  . IR THORACENTESIS ASP PLEURAL SPACE W/IMG GUIDE  03/17/2018  . IR THORACENTESIS ASP PLEURAL SPACE W/IMG GUIDE  05/08/2018  . IR US GUIDE VASC ACCESS RIGHT  07/22/2017  . KNEE ARTHROSCOPY W/ LATERAL RELEASE Left 09-03-2005   dr Noemi Chapel  [facility]   w/  Lysis Adhesions,  excision loose body's  . LAPAROSCOPIC CHOLECYSTECTOMY  12-04-2010  dr zeigler  . LAPAROTOMY N/A 03/20/2018   Procedure: EXPLORATORY LAPAROTOMY;  Surgeon: Isabel Caprice, MD;  Location: [facility];  Service: Gynecology;  Laterality: N/A;  . OMENTECTOMY N/A 03/20/2018   Procedure: OMENTECTOMY;  Surgeon: Isabel Caprice, MD;  Location: [facility];  Service: Gynecology;  Laterality: N/A;  . TOTAL KNEE ARTHROPLASTY Left 09-08-2003   dr Noemi Chapel  [facility]  . TOTAL KNEE REVISION  08/06/2012   Procedure: TOTAL KNEE REVISION;  Surgeon: [name], MD;  Location: [facility];  Service: Orthopedics;  Laterality: Left;  Left Total Knee Arthroplasty Revision  . TRANSTHORACIC ECHOCARDIOGRAM  08/20/2017   ef 60-65%,  grade 1 diastolic dysfunction/  trivial AR and TR  . Uterine polypectomy      Current Outpatient Medications  Medication Sig Dispense Refill  . albuterol (PROAIR HFA) 108 (90 Base) MCG/ACT inhaler Inhale 2 puffs into the lungs every 6 (six) hours as needed for wheezing or shortness of breath. 1 Inhaler 3  . ALPRAZolam (XANAX) 0.25 MG tablet Take 1 tablet (0.25 mg total) by mouth at bedtime as needed for anxiety. 5 tablet 0  . azelastine (ASTELIN) 0.1 % nasal spray Place 2 sprays into both nostrils 2 (two) times daily as needed for allergies.     . Cholecalciferol (VITAMIN D) 2000 units CAPS Take 2,000 Units by mouth daily.    Marland Kitchen dicyclomine (BENTYL) 10 MG capsule Take 1 tab by mouth every morning. May take twice daily as needed. 60 capsule 6  .  diltiazem (CARDIZEM CD) 120 MG 24 hr capsule Take 1 capsule (120 mg total) by mouth every evening. 90 capsule 3  . diltiazem (CARDIZEM CD) 180 MG 24 hr capsule Take 1 capsule (180 mg total) by mouth every morning. 90 capsule 3  . diphenhydrAMINE (BENADRYL) 25 mg capsule Take 25 mg by mouth every 6 (six) hours as needed for itching.    . diphenoxylate-atropine (LOMOTIL) 2.5-0.025 MG tablet Take 1 tablet by mouth 4 (four) times daily. (Patient taking differently: Take 1 tablet by mouth 4 (four) times daily as needed for diarrhea or loose stools. ) 360 tablet 1  . DULoxetine (CYMBALTA) 30 MG capsule TAKE 1 CAPSULE(30 MG) BY MOUTH DAILY AT NIGHT 30 capsule 0  . fluticasone (FLONASE) 50 MCG/ACT nasal  spray Place 2 sprays into both nostrils 2 (two) times daily as needed (FOR NASAL CONGESTION.).     Marland Kitchen furosemide (LASIX) 20 MG tablet Take 1 tablet (20 mg total) by mouth daily. 90 tablet 9  . letrozole (FEMARA) 2.5 MG tablet Take 1 tablet (2.5 mg total) by mouth daily. 30 tablet 3  . levothyroxine (SYNTHROID, LEVOTHROID) 137 MCG tablet Take 137 mcg by mouth daily before breakfast. For thyroid therapy    . lidocaine-prilocaine (EMLA) cream Apply 1 application topically as needed. (Patient taking differently: Apply 1 application topically as needed (port). ) 30 g 6  . mirtazapine (REMERON) 15 MG tablet TAKE 1 TABLET(15 MG) BY MOUTH AT BEDTIME 30 tablet 11  . morphine (MS CONTIN) 15 MG 12 hr tablet Take 1 tablet (15 mg total) by mouth every 12 (twelve) hours. 60 tablet 0  . ondansetron (ZOFRAN) 8 MG tablet Take 8 mg by mouth every 8 (eight) hours as needed for nausea or vomiting.    . polyvinyl alcohol (LIQUIFILM TEARS) 1.4 % ophthalmic solution 1 drop 2 (two) times daily as needed for dry eyes.    . potassium chloride (K-DUR) 10 MEQ tablet Take 10 mEq by mouth daily.    . rivaroxaban (XARELTO) 20 MG TABS tablet Take 20 mg by mouth daily with supper.    . Tiotropium Bromide-Olodaterol (STIOLTO RESPIMAT) 2.5-2.5  MCG/ACT AERS Inhale 2 puffs into the lungs daily. 1 Inhaler 3   No current facility-administered medications for this visit.    Allergies:  Codeine   Social History: The patient  reports that she quit smoking about 37 years ago. Her smoking use included cigarettes. She has a 20.00 pack-year smoking history. She has never used smokeless tobacco. She reports that she drinks alcohol. She reports that she does not use drugs.   ROS:  Please see the history of present illness. Otherwise, complete review of systems is positive for chronic fatigue.  All other systems are reviewed and negative.   Physical Exam: VS:  BP 124/64 (BP Location: Right Arm)   Pulse 88   Ht 5' 4"  (1.626 m)   Wt 167 lb (75.8 kg)   SpO2 97%   BMI 28.67 kg/m , BMI Body mass index is 28.67 kg/m.  Wt Readings from Last 3 Encounters:  05/29/18 167 lb (75.8 kg)  05/28/18 166 lb 12.8 oz (75.7 kg)  05/05/18 166 lb 3.2 oz (75.4 kg)    General: Elderly woman, appears comfortable at rest. HEENT: Conjunctiva and lids normal, oropharynx clear. Neck: Supple, no elevated JVP or carotid bruits, no thyromegaly. Lungs: Decreased breath sounds right base, nonlabored breathing at rest. Cardiac: Regular rate and rhythm, no S3, soft systolic murmur. Abdomen: Soft, nontender, bowel sounds present. Extremities: Mild ankle edema, distal pulses 2+. Skin: Warm and dry. Musculoskeletal: No kyphosis. Neuropsychiatric: Alert and oriented x3, affect grossly appropriate.  ECG: I personally reviewed the tracing from 03/22/2018 which showed a narrow complex regular tachycardia read as rapid atrial fibrillation although could be consistent with an atrial tachycardia.  Recent Labwork: 08/12/2017: NT-Pro BNP 837 11/10/2017: TSH 0.388 11/12/2017: B Natriuretic Peptide 76.7 03/22/2018: Magnesium 1.7 05/28/2018: ALT 9; AST 13; BUN 15; Creatinine, Ser 1.04; Hemoglobin 11.9; Platelets 197; Potassium 4.0; Sodium 137     Component Value Date/Time   CHOL  147 11/14/2017 1413    Other Studies Reviewed Today:  Echocardiogram 08/20/2017: Study Conclusions  - Left ventricle: The cavity size was normal. Wall thickness was   normal. Systolic function was normal.  The estimated ejection   fraction was in the range of 60% to 65%. Wall motion was normal;   there were no regional wall motion abnormalities. Doppler   parameters are consistent with abnormal left ventricular   relaxation (grade 1 diastolic dysfunction). - Aortic valve: There was no stenosis. There was trivial   regurgitation. - Mitral valve: Mildly calcified annulus. There was no significant   regurgitation. - Right ventricle: The cavity size was normal. Systolic function   was normal. - Tricuspid valve: Peak RV-RA gradient (S): 27 mm Hg. - Pulmonary arteries: PA peak pressure: 30 mm Hg (S). - Inferior vena cava: The vessel was normal in size. The   respirophasic diameter changes were in the normal range (= 50%),   consistent with normal central venous pressure.  Impressions:  - Normal LV size with EF 60-65%. Normal RV size and systolic   function. No significant valvular abnormalities.  Assessment and Plan:  1.  Paroxysmal atrial fibrillation with CHADSVASC score of 3.  She resumed Xarelto in June and has done well so far with recent hemoglobin up to 11.9.  Still at risk for recurrent GI bleeding.  Other option would be switching to Eliquis, but this may not necessarily reduce her bleeding risk that much more.  For now we will hold the course.  Not report any active palpitations on Cardizem CD at current dose.  2.  COPD and recurrent right pleural effusion followed by Pulmonary.  3.  Ovarian cancer, following closely in the oncology clinic.  She has undergone hysterectomy with BSO.  Current medicines were reviewed with the patient today.   Disposition: Follow-up in 3 months.  Signed, Satira Sark, MD, [facility] 05/29/2018 11:59 AM    [facility] [address] [place], [place] [address] Phone: [phone]; Fax: [phone]

## 2018-05-28 NOTE — Assessment & Plan Note (Signed)
Clinically, she is not symptomatic She tolerated antiestrogen therapy well I will continue see her on a monthly basis with close monitoring of tumor marker I plan to repeat imaging study in October for objective assessment of response to therapy We discussed the role of antiestrogen therapy versus chemotherapy

## 2018-05-28 NOTE — Assessment & Plan Note (Signed)
She has recurrent pleural effusion. Repeated fluid cytology was negative for malignancy She has occasional cough and intermittent shortness of breath She has appointment to see pulmonologist and I will defer to them for further management

## 2018-05-29 ENCOUNTER — Ambulatory Visit (HOSPITAL_COMMUNITY): Payer: Medicare Other | Admitting: Physical Therapy

## 2018-05-29 ENCOUNTER — Encounter: Payer: Self-pay | Admitting: Cardiology

## 2018-05-29 ENCOUNTER — Telehealth: Payer: Self-pay | Admitting: *Deleted

## 2018-05-29 ENCOUNTER — Other Ambulatory Visit: Payer: Self-pay | Admitting: Hematology and Oncology

## 2018-05-29 ENCOUNTER — Ambulatory Visit (INDEPENDENT_AMBULATORY_CARE_PROVIDER_SITE_OTHER): Payer: Medicare Other | Admitting: Cardiology

## 2018-05-29 VITALS — BP 124/64 | HR 88 | Ht 64.0 in | Wt 167.0 lb

## 2018-05-29 DIAGNOSIS — I480 Paroxysmal atrial fibrillation: Secondary | ICD-10-CM | POA: Diagnosis not present

## 2018-05-29 DIAGNOSIS — J449 Chronic obstructive pulmonary disease, unspecified: Secondary | ICD-10-CM | POA: Diagnosis not present

## 2018-05-29 DIAGNOSIS — M6281 Muscle weakness (generalized): Secondary | ICD-10-CM

## 2018-05-29 DIAGNOSIS — M79662 Pain in left lower leg: Secondary | ICD-10-CM

## 2018-05-29 DIAGNOSIS — C569 Malignant neoplasm of unspecified ovary: Secondary | ICD-10-CM

## 2018-05-29 DIAGNOSIS — R262 Difficulty in walking, not elsewhere classified: Secondary | ICD-10-CM

## 2018-05-29 DIAGNOSIS — R2689 Other abnormalities of gait and mobility: Secondary | ICD-10-CM

## 2018-05-29 DIAGNOSIS — R29898 Other symptoms and signs involving the musculoskeletal system: Secondary | ICD-10-CM

## 2018-05-29 LAB — CA 125: Cancer Antigen (CA) 125: 190 U/mL — ABNORMAL HIGH (ref 0.0–38.1)

## 2018-05-29 NOTE — Patient Instructions (Signed)
Your physician wants you to follow-up in: 3 months with Dr.McDowell       Your physician recommends that you continue on your current medications as directed. Please refer to the Current Medication list given to you today.    If you need a refill on your cardiac medications before your next appointment, please call your pharmacy.      No labs or tests today.       Thank you for choosing [facility] !

## 2018-05-29 NOTE — Telephone Encounter (Signed)
-----   Message from Heath Lark, MD sent at 05/29/2018 10:35 AM EDT ----- Regarding: CA-125 Let her know CA-125 is stable ----- Message ----- From: Interface, Lab In [place] Sent: 05/28/2018  10:48 AM EDT To: Heath Lark, MD

## 2018-05-29 NOTE — Telephone Encounter (Signed)
Notified of message below

## 2018-05-29 NOTE — Therapy (Signed)
[place], [place], [address] Phone: [phone]   Fax:  [phone]  Physical Therapy Treatment  Patient Details  Name: [name] MRN: [id number] Date of Birth: [birth year] Referring Provider: Dr Heath Lark   Encounter Date: 05/29/2018  PT End of Session - 05/29/18 1110    Visit Number  4    Number of Visits  12    Date for PT Re-Evaluation  06/25/18   mini-re-assess 06/04/2018   Authorization Type  Medicare; secondary - Generic Commercial    Authorization Time Period  05/14/18-06/25/2018    Authorization - Visit Number  4    Authorization - Number of Visits  10    PT Start Time  1034    PT Stop Time  1115    PT Time Calculation (min)  41 min    Equipment Utilized During Treatment  Gait belt    Activity Tolerance  Patient tolerated treatment well    Behavior During Therapy  [facility] for tasks assessed/performed       Past Medical History:  Diagnosis Date  . Anxiety   . Chronic blood loss anemia    03-04-2018 diverticular bleed and rectal bleeding,  transfused 2 units PRBCs 03-08-2018  . COPD (chronic obstructive pulmonary disease) ([place])    pulmologist-  dr byrum  . Depression   . Diastolic CHF, chronic ([place])    followed by cardiology  . Diverticulosis   . Dyspnea on effort   . Family history of colon cancer   . Family hx of colon cancer   . Fibromyalgia   . Genetic testing 04/07/2018   MyRisk (35 genes) @ [facility] - No pathogenic mutations detected  . GERD (gastroesophageal reflux disease)    03-17-2018 per pt has not had any gerd issues in long time and no meds prn  . Hemorrhoids   . Hiatal hernia   . History of lower GI bleeding 03/04/2018   admission--  dx diverticular bleed and rectal bleed in setting of xarelto-- xarelto stopped and transfused 03-08-2018 2 units PRBCs  . History of rectal polyps   . History of shingles   . Hypothyroidism   . IBS (irritable bowel syndrome)   . Lymphocytic  colitis    FOLLOWED BY DR Henrene Pastor  . Malignant ascites    dx at admission 09/ 2018 abdominal s/p  parencentesis 07-01-2017 2.5L,  07-08-2016  2.7L,  07-12-2017  147m  . Neuropathy due to chemotherapeutic drug ([place]   . Osteoporosis   . Ovarian cancer ([facility] oncologist-  gors[place]/  dr pGerarda Fraction  chemotherapy 07-19-2017 to 11-04-2017  . Paroxysmal atrial fibrillation ([place]    CARDIOLOGIST-  DR S. MCDOWELL-  first dx'd 07/ 2014---was taking xarelto up until 03-07-2018 stopped due to lower GI bleed  . Pleural effusion    s/p  right thoracentesis, 02-2018 1.3L and 03-17-2018 right thoracentesis 646m, post cxr no residual effusion  . Psoriatic arthritis ([place]  . Schatzki's ring    S/P  DILATERAL 2013  . Seasonal allergic rhinitis     Past Surgical History:  Procedure Laterality Date  . CARDIOVASCULAR STRESS TEST  09/23/2012   Low risk lexiscan nuclear study w/ apical thinning but no evidence of ischemia/  normal LV function and wall motion , ef 75%  . CATARACT EXTRACTION W/ INTRAOCULAR LENS  IMPLANT, BILATERAL  10/2016  . COLONOSCOPY    . DEBULKING N/A 03/20/2018   Procedure: DEBULKING;  Surgeon: PhIsabel Caprice  MD;  Location: [facility];  Service: Gynecology;  Laterality: N/A;  . EXAM UNDER ANESTHESIA WITH MANIPULATION OF KNEE Left 12-20-2003  dr Noemi Chapel   post TKA  . FEMUR IM NAIL Left 12/11/2013   Procedure: INTRAMEDULLARY (IM) NAIL FEMORAL;  Surgeon: [name], MD;  Location: [facility];  Service: Orthopedics;  Laterality: Left;  . HYSTERECTOMY ABDOMINAL WITH SALPINGO-OOPHORECTOMY Bilateral 03/20/2018   Procedure: TOTAL HYSTERECTOMY ABDOMINAL WITH BILATERAL  SALPINGO-OOPHORECTOMY;  Surgeon: Isabel Caprice, MD;  Location: [facility];  Service: Gynecology;  Laterality: Bilateral;  . IR FLUORO GUIDE PORT INSERTION RIGHT  07/22/2017  . IR PARACENTESIS  07/12/2017  . IR THORACENTESIS ASP PLEURAL SPACE W/IMG GUIDE  03/17/2018  . IR THORACENTESIS ASP PLEURAL SPACE W/IMG GUIDE  05/08/2018  . IR US  GUIDE VASC ACCESS RIGHT  07/22/2017  . KNEE ARTHROSCOPY W/ LATERAL RELEASE Left 09-03-2005   dr Noemi Chapel  [facility]   w/  Lysis Adhesions,  excision loose body's  . LAPAROSCOPIC CHOLECYSTECTOMY  12-04-2010  dr zeigler  . LAPAROTOMY N/A 03/20/2018   Procedure: EXPLORATORY LAPAROTOMY;  Surgeon: Isabel Caprice, MD;  Location: [facility];  Service: Gynecology;  Laterality: N/A;  . OMENTECTOMY N/A 03/20/2018   Procedure: OMENTECTOMY;  Surgeon: Isabel Caprice, MD;  Location: [facility];  Service: Gynecology;  Laterality: N/A;  . TOTAL KNEE ARTHROPLASTY Left 09-08-2003   dr Noemi Chapel  [facility]  . TOTAL KNEE REVISION  08/06/2012   Procedure: TOTAL KNEE REVISION;  Surgeon: [name], MD;  Location: [facility];  Service: Orthopedics;  Laterality: Left;  Left Total Knee Arthroplasty Revision  . TRANSTHORACIC ECHOCARDIOGRAM  08/20/2017   ef 60-65%,  grade 1 diastolic dysfunction/  trivial AR and TR  . Uterine polypectomy      There were no vitals filed for this visit.  Subjective Assessment - 05/29/18 1039    Subjective  PT comes today using RW.  States her hips hurt but not bad enough to rate.  Pt given signed order for rollator.      Currently in Pain?  No/denies                       [facility] Adult PT Treatment/Exercise - 05/29/18 0001      Ambulation/Gait   Ambulation Distance (Feet)  226 Feet   2 bouts: 215 and 226 with rest between   Assistive device  Straight cane    Gait Pattern  Step-through pattern;Decreased hip/knee flexion - left;Decreased stride length;Trunk flexed      Knee/Hip Exercises: Standing   Heel Raises  Both;20 reps    Heel Raises Limitations  toeraises 15 reps    Forward Lunges  Both;Limitations;15 reps    Forward Lunges Limitations  onto 4" box    Gait Training  gait with SPC x25f, X226 feet max before fatigue due to SOB    Other Standing Knee Exercises  vectors 5X5" each LE with 1 HHA    Other Standing Knee Exercises  tandem on foam with 1# bar flexion 10 reps each       Knee/Hip Exercises: Seated   Sit to Sand  10 reps;with UE support               PT Short Term Goals - 05/15/18 1146      PT SHORT TERM GOAL #1   Title  Patient and caregivers will be independent in initial HEP with routine performance.     Time  3  Period  Weeks    Status  On-going      PT SHORT TERM GOAL #2   Title  Patient with tolerate the standing position with upper extremity support for 5 minutes before needing to rest to improve her ability to perform standing functional activities and ADLs within her home environment.    Time  3    Period  Weeks    Status  On-going      PT SHORT TERM GOAL #3   Title  Patient will increase MMT grades by 1/2 grade in deficient musculature to show improvement in her strength and stability to perform functional activities.    Time  3    Period  Weeks    Status  On-going      PT SHORT TERM GOAL #4   Title  Patient will be able to stand on either foot for 12 seconds to exhibit improved balance and decreased risk for falls.    Time  3    Period  Weeks    Status  On-going        PT Long Term Goals - 05/15/18 1146      PT LONG TERM GOAL #1   Title  Patient will ambulate >300 feet with LRAD during the 2 MWT to improve her ability to ambulate further in community to allow to her go enjoy going out for a meal.    Time  6    Period  Weeks    Status  On-going      PT LONG TERM GOAL #2   Title  Patient with tolerate the standing position with upper extremity support for 10 minutes before needing to rest to improve her ability to stand and have a conversation with a friend or relative.    Time  6    Period  Weeks    Status  On-going      PT LONG TERM GOAL #3   Title  Patient will increase MMT grades by 1 grade in deficent musculature to show continued improvement in her strength and stability to perform functional activities at home independently.    Time  6    Period  Weeks    Status  On-going      PT LONG TERM GOAL #4    Title  Patient will be able to stand on either foot for 16 seconds to exhibit improved balance and further decreased risk for falls.    Time  6    Period  Weeks    Status  On-going      PT LONG TERM GOAL #5   Title  Pt will be able to perform 5xSTS in 12 sec or < with no UE in order to demo improved functional BLE strength and overall balance.    Time  6    Period  Weeks    Status  On-going      PT LONG TERM GOAL #6   Title  Pt will report requiring assistance for ADLs and IADLs at home 25% of the time or < to demonstrate improved overall functional strength, endurance, and balance in order to promote independence at home.    Time  6    Period  Weeks    Status  On-going            Plan - 05/29/18 1111    Clinical Impression Statement  Able to tolerate 2 bouts of ambulation this session with max distance of 226 feet, one full lap, using  SPC.  Pt continues to require multiple short rest breaks due to fatigue/shortness of breath.  Added tandem on foam with 1# bar lifts to work on stability with no LOB but cues to complete task more slowly    Rehab Potential  Good    PT Frequency  2x / week    PT Duration  6 weeks    PT Treatment/Interventions  ADLs/Self Care Home Management;Gait training;Stair training;Functional mobility training;Therapeutic activities;Patient/family education;Balance training;Neuromuscular re-education;Therapeutic exercise;Manual techniques;Energy conservation    PT Next Visit Plan  Progress closed chain exercises as able; continue balance activity. inquire if used her script for a rollator    Consulted and Agree with Plan of Care  Patient       Patient will benefit from skilled therapeutic intervention in order to improve the following deficits and impairments:  Abnormal gait, Pain, Decreased mobility, Decreased coordination, Decreased activity tolerance, Decreased endurance, Decreased strength, Decreased balance, Difficulty walking  Visit Diagnosis: Muscle  weakness (generalized)  Other abnormalities of gait and mobility  Other symptoms and signs involving the musculoskeletal system  Pain in left lower leg  Difficulty in walking, not elsewhere classified     Problem List Patient Active Problem List   Diagnosis Date Noted  . Other constipation 05/28/2018  . Psoriatic arthritis ([place]) 04/22/2018  . Genetic testing 04/07/2018  . Family history of colon cancer   . History of GI diverticular bleed 03/12/2018  . Lower GI bleed   . Diverticulosis of colon with hemorrhage   . Rectal bleeding 03/04/2018  . Acute GI bleeding 03/04/2018  . Thrombocytopenia ([place]) 01/03/2018  . Anemia, chronic disease 12/13/2017  . Mild protein-calorie malnutrition ([place]) 12/13/2017  . Peripheral neuropathy due to chemotherapy ([place]) 12/13/2017  . S/P thoracentesis   . Atrial fibrillation with RVR ([place]) 11/08/2017  . HCAP (healthcare-associated pneumonia) 11/08/2017  . Pleural effusion 09/20/2017  . Acute on chronic diastolic heart failure ([place]) 08/19/2017  . Pancytopenia, acquired ([place]) 08/15/2017  . Protein-calorie malnutrition, moderate ([place]) 07/30/2017  . Generalized weakness 07/30/2017  . Antineoplastic chemotherapy induced pancytopenia ([place]) 07/20/2017  . Left ovarian epithelial cancer ([place]) 07/18/2017  . PNA (pneumonia) 07/07/2017  . Ascites 06/30/2017  . Ascites, malignant 06/30/2017  . Elevated CA-125 01/18/2017  . Depression 12/23/2015  . Peripheral edema 08/23/2015  . Fracture of hip, left, closed ([place]) 12/08/2013  . Hip fracture ([place]) 12/08/2013  . PAD (peripheral artery disease) ([place]) 11/25/2013  . Encounter for therapeutic drug monitoring 11/09/2013  . Cough 03/20/2013  . Total knee replacement status 10/07/2012  . Knee pain 10/07/2012  . Knee stiffness 10/07/2012  . Tachycardia 09/17/2012  . Chest pain 09/17/2012  . Difficulty in walking(719.7) 09/16/2012  . Muscle weakness (generalized) 09/16/2012  . Postop Acute blood loss  anemia 08/08/2012  . Instability of prosthetic knee ([place]) 08/06/2012  . Bloating 02/14/2012  . Upper abdominal pain 02/14/2012  . Allergic rhinitis, seasonal 02/15/2011  . COLITIS 02/20/2010  . Diarrhea 01/02/2010  . ABDOMINAL PAIN -GENERALIZED 01/02/2010  . PERSONAL HX COLONIC POLYPS 01/02/2010  . Hypothyroidism 12/28/2009  . COPD (chronic obstructive pulmonary disease) ([place]) 12/28/2009  . ARTHRITIS 12/28/2009   Teena Irani, PTA/CLT [phone]  Teena Irani 05/29/2018, 11:19 AM  [place], [place], [address] Phone: [phone]   Fax:  [phone]  Name: [name] MRN: [id number] Date of Birth: Feb 19, 1933

## 2018-06-02 ENCOUNTER — Encounter (HOSPITAL_COMMUNITY): Payer: Self-pay

## 2018-06-02 ENCOUNTER — Encounter (HOSPITAL_COMMUNITY)
Admission: RE | Admit: 2018-06-02 | Discharge: 2018-06-02 | Disposition: A | Discharge status: Home / Self Care | Payer: Medicare Other | Source: Ambulatory Visit | Attending: Internal Medicine | Admitting: Internal Medicine

## 2018-06-02 ENCOUNTER — Ambulatory Visit (HOSPITAL_COMMUNITY): Payer: Medicare Other

## 2018-06-02 DIAGNOSIS — M6281 Muscle weakness (generalized): Secondary | ICD-10-CM | POA: Diagnosis not present

## 2018-06-02 DIAGNOSIS — M79662 Pain in left lower leg: Secondary | ICD-10-CM

## 2018-06-02 DIAGNOSIS — M810 Age-related osteoporosis without current pathological fracture: Secondary | ICD-10-CM | POA: Insufficient documentation

## 2018-06-02 DIAGNOSIS — R2689 Other abnormalities of gait and mobility: Secondary | ICD-10-CM

## 2018-06-02 DIAGNOSIS — R29898 Other symptoms and signs involving the musculoskeletal system: Secondary | ICD-10-CM

## 2018-06-02 DIAGNOSIS — R262 Difficulty in walking, not elsewhere classified: Secondary | ICD-10-CM

## 2018-06-02 MED ORDER — DENOSUMAB 60 MG/ML ~~LOC~~ SOSY
60.0000 mg | PREFILLED_SYRINGE | Freq: Once | SUBCUTANEOUS | Status: AC
  Administered 2018-06-02: 60 mg via SUBCUTANEOUS
  Filled 2018-06-02: qty 1

## 2018-06-02 NOTE — Therapy (Signed)
[place], [place], [address] Phone: [phone]   Fax:  [phone]  Physical Therapy Treatment  Patient Details  Name: [name] MRN: [id number] Date of Birth: 04-05-1933 Referring Provider: Dr Heath Lark   Encounter Date: 06/02/2018  PT End of Session - 06/02/18 1307    Visit Number  5    Number of Visits  12    Date for PT Re-Evaluation  06/25/18   mini-re-assess 06/04/2018   Authorization Type  Medicare; secondary - Generic Commercial    Authorization Time Period  05/14/18-06/25/2018    Authorization - Visit Number  5    Authorization - Number of Visits  10    PT Start Time  1300    PT Stop Time  1341    PT Time Calculation (min)  41 min    Equipment Utilized During Treatment  Gait belt    Activity Tolerance  Patient tolerated treatment well    Behavior During Therapy  [facility] for tasks assessed/performed       Past Medical History:  Diagnosis Date  . Anxiety   . Chronic blood loss anemia    03-04-2018 diverticular bleed and rectal bleeding,  transfused 2 units PRBCs 03-08-2018  . COPD (chronic obstructive pulmonary disease) ([place])    pulmologist-  dr byrum  . Depression   . Diastolic CHF, chronic ([place])    followed by cardiology  . Diverticulosis   . Dyspnea on effort   . Family history of colon cancer   . Family hx of colon cancer   . Fibromyalgia   . Genetic testing 04/07/2018   MyRisk (35 genes) @ [facility] - No pathogenic mutations detected  . GERD (gastroesophageal reflux disease)    03-17-2018 per pt has not had any gerd issues in long time and no meds prn  . Hemorrhoids   . Hiatal hernia   . History of lower GI bleeding 03/04/2018   admission--  dx diverticular bleed and rectal bleed in setting of xarelto-- xarelto stopped and transfused 03-08-2018 2 units PRBCs  . History of rectal polyps   . History of shingles   . Hypothyroidism   . IBS (irritable bowel syndrome)   . Lymphocytic  colitis    FOLLOWED BY DR Henrene Pastor  . Malignant ascites    dx at admission 09/ 2018 abdominal s/p  parencentesis 07-01-2017 2.5L,  07-08-2016  2.7L,  07-12-2017  1498m  . Neuropathy due to chemotherapeutic drug ([place]   . Osteoporosis   . Ovarian cancer ([facility] oncologist-  gors[place]/  dr pGerarda Fraction  chemotherapy 07-19-2017 to 11-04-2017  . Paroxysmal atrial fibrillation ([place]    CARDIOLOGIST-  DR S. MCDOWELL-  first dx'd 07/ 2014---was taking xarelto up until 03-07-2018 stopped due to lower GI bleed  . Pleural effusion    s/p  right thoracentesis, 02-2018 1.3L and 03-17-2018 right thoracentesis 6431m, post cxr no residual effusion  . Psoriatic arthritis ([place]  . Schatzki's ring    S/P  DILATERAL 2013  . Seasonal allergic rhinitis     Past Surgical History:  Procedure Laterality Date  . CARDIOVASCULAR STRESS TEST  09/23/2012   Low risk lexiscan nuclear study w/ apical thinning but no evidence of ischemia/  normal LV function and wall motion , ef 75%  . CATARACT EXTRACTION W/ INTRAOCULAR LENS  IMPLANT, BILATERAL  10/2016  . COLONOSCOPY    . DEBULKING N/A 03/20/2018   Procedure: DEBULKING;  Surgeon: PhIsabel Caprice  MD;  Location: [facility];  Service: Gynecology;  Laterality: N/A;  . EXAM UNDER ANESTHESIA WITH MANIPULATION OF KNEE Left 12-20-2003  dr Noemi Chapel   post TKA  . FEMUR IM NAIL Left 12/11/2013   Procedure: INTRAMEDULLARY (IM) NAIL FEMORAL;  Surgeon: [name], MD;  Location: [facility];  Service: Orthopedics;  Laterality: Left;  . HYSTERECTOMY ABDOMINAL WITH SALPINGO-OOPHORECTOMY Bilateral 03/20/2018   Procedure: TOTAL HYSTERECTOMY ABDOMINAL WITH BILATERAL  SALPINGO-OOPHORECTOMY;  Surgeon: Isabel Caprice, MD;  Location: [facility];  Service: Gynecology;  Laterality: Bilateral;  . IR FLUORO GUIDE PORT INSERTION RIGHT  07/22/2017  . IR PARACENTESIS  07/12/2017  . IR THORACENTESIS ASP PLEURAL SPACE W/IMG GUIDE  03/17/2018  . IR THORACENTESIS ASP PLEURAL SPACE W/IMG GUIDE  05/08/2018  . IR US  GUIDE VASC ACCESS RIGHT  07/22/2017  . KNEE ARTHROSCOPY W/ LATERAL RELEASE Left 09-03-2005   dr Noemi Chapel  [facility]   w/  Lysis Adhesions,  excision loose body's  . LAPAROSCOPIC CHOLECYSTECTOMY  12-04-2010  dr zeigler  . LAPAROTOMY N/A 03/20/2018   Procedure: EXPLORATORY LAPAROTOMY;  Surgeon: Isabel Caprice, MD;  [facility];  Service: Gynecology;  Laterality: N/A;  . OMENTECTOMY N/A 03/20/2018   Procedure: OMENTECTOMY;  Surgeon: Isabel Caprice, MD;  [facility];  Service: Gynecology;  Laterality: N/A;  . TOTAL KNEE ARTHROPLASTY Left 09-08-2003   dr Noemi Chapel  [facility]  . TOTAL KNEE REVISION  08/06/2012   Procedure: TOTAL KNEE REVISION;  Surgeon: [name], MD;  Location: [facility];  Service: Orthopedics;  Laterality: Left;  Left Total Knee Arthroplasty Revision  . TRANSTHORACIC ECHOCARDIOGRAM  08/20/2017   ef 60-65%,  grade 1 diastolic dysfunction/  trivial AR and TR  . Uterine polypectomy      There were no vitals filed for this visit.  Subjective Assessment - 06/02/18 1306    Subjective  Pt states that she is hurting all over. She's not sure if it is her psoriatic arthritis injections or her chemo pill causing it because both have similar side effects.    Currently in Pain?  Yes    Pain Score  5     Pain Location  Generalized              [facility] Adult PT Treatment/Exercise - 06/02/18 0001      Exercises   Exercises  Knee/Hip      Knee/Hip Exercises: Standing   Heel Raises  Both;20 reps    Heel Raises Limitations  heel and toe    Hip Flexion  Both;15 reps    Hip Flexion Limitations  1 HHA, 3" holds    Forward Lunges  Both;15 reps    Forward Lunges Limitations  onto 4" box, intermittent UE support    Forward Step Up  Both;10 reps;Step Height: 4"    Walking with Sports Cord  sidestepping RTB in // bars x4RT    Gait Training  gait wtih SPC x171f with 1 standing rest break due to SOB            PT Education - 06/02/18 1338    Education provided  Yes    Education  Details  exercise technique, proper breathing for O2 recovery during breaks    Person(s) Educated  Patient    Methods  Explanation;Demonstration    Comprehension  Verbalized understanding;Returned demonstration       PT Short Term Goals - 05/15/18 1146      PT SHORT TERM GOAL #1  Title  Patient and caregivers will be independent in initial HEP with routine performance.     Time  3    Period  Weeks    Status  On-going      PT SHORT TERM GOAL #2   Title  Patient with tolerate the standing position with upper extremity support for 5 minutes before needing to rest to improve her ability to perform standing functional activities and ADLs within her home environment.    Time  3    Period  Weeks    Status  On-going      PT SHORT TERM GOAL #3   Title  Patient will increase MMT grades by 1/2 grade in deficient musculature to show improvement in her strength and stability to perform functional activities.    Time  3    Period  Weeks    Status  On-going      PT SHORT TERM GOAL #4   Title  Patient will be able to stand on either foot for 12 seconds to exhibit improved balance and decreased risk for falls.    Time  3    Period  Weeks    Status  On-going        PT Long Term Goals - 05/15/18 1146      PT LONG TERM GOAL #1   Title  Patient will ambulate >300 feet with LRAD during the 2 MWT to improve her ability to ambulate further in community to allow to her go enjoy going out for a meal.    Time  6    Period  Weeks    Status  On-going      PT LONG TERM GOAL #2   Title  Patient with tolerate the standing position with upper extremity support for 10 minutes before needing to rest to improve her ability to stand and have a conversation with a friend or relative.    Time  6    Period  Weeks    Status  On-going      PT LONG TERM GOAL #3   Title  Patient will increase MMT grades by 1 grade in deficent musculature to show continued improvement in her strength and stability to perform  functional activities at home independently.    Time  6    Period  Weeks    Status  On-going      PT LONG TERM GOAL #4   Title  Patient will be able to stand on either foot for 16 seconds to exhibit improved balance and further decreased risk for falls.    Time  6    Period  Weeks    Status  On-going      PT LONG TERM GOAL #5   Title  Pt will be able to perform 5xSTS in 12 sec or < with no UE in order to demo improved functional BLE strength and overall balance.    Time  6    Period  Weeks    Status  On-going      PT LONG TERM GOAL #6   Title  Pt will report requiring assistance for ADLs and IADLs at home 25% of the time or < to demonstrate improved overall functional strength, endurance, and balance in order to promote independence at home.    Time  6    Period  Weeks    Status  On-going            Plan - 06/02/18 1339  Clinical Impression Statement  Continued with focus on gait endurance, overall strengthening, and balance this date. Added step ups for BLE strengthening this date with good tolerance. Pt continues to require multiple rest breaks due to SOB; O2 remained typically remained 90% or > throughout session  but did drop to 81% at its lowest point, however, it improved to mid-high 90s after seated breaks. Pt states she sees her pulmonary doctor tomorrow and will hopefully address the potential fluid that has built back up on her lungs. Unable to perform balance work this date due to increased rest breaks required and time constraints. Continue as planned, progressing as able.     Rehab Potential  Good    PT Frequency  2x / week    PT Duration  6 weeks    PT Treatment/Interventions  ADLs/Self Care Home Management;Gait training;Stair training;Functional mobility training;Therapeutic activities;Patient/family education;Balance training;Neuromuscular re-education;Therapeutic exercise;Manual techniques;Energy conservation    PT Next Visit Plan  Progress closed chain  exercises as able; continue balance activity. inquire if used her script for a rollator    Consulted and Agree with Plan of Care  Patient       Patient will benefit from skilled therapeutic intervention in order to improve the following deficits and impairments:  Abnormal gait, Pain, Decreased mobility, Decreased coordination, Decreased activity tolerance, Decreased endurance, Decreased strength, Decreased balance, Difficulty walking  Visit Diagnosis: Muscle weakness (generalized)  Other abnormalities of gait and mobility  Other symptoms and signs involving the musculoskeletal system  Pain in left lower leg  Difficulty in walking, not elsewhere classified     Problem List Patient Active Problem List   Diagnosis Date Noted  . Other constipation 05/28/2018  . Psoriatic arthritis ([place]) 04/22/2018  . Genetic testing 04/07/2018  . Family history of colon cancer   . History of GI diverticular bleed 03/12/2018  . Lower GI bleed   . Diverticulosis of colon with hemorrhage   . Rectal bleeding 03/04/2018  . Acute GI bleeding 03/04/2018  . Thrombocytopenia ([place]) 01/03/2018  . Anemia, chronic disease 12/13/2017  . Mild protein-calorie malnutrition ([place]) 12/13/2017  . Peripheral neuropathy due to chemotherapy ([place]) 12/13/2017  . S/P thoracentesis   . Atrial fibrillation with RVR ([place]) 11/08/2017  . HCAP (healthcare-associated pneumonia) 11/08/2017  . Pleural effusion 09/20/2017  . Acute on chronic diastolic heart failure ([place]) 08/19/2017  . Pancytopenia, acquired ([place]) 08/15/2017  . Protein-calorie malnutrition, moderate ([place]) 07/30/2017  . Generalized weakness 07/30/2017  . Antineoplastic chemotherapy induced pancytopenia ([place]) 07/20/2017  . Left ovarian epithelial cancer ([place]) 07/18/2017  . PNA (pneumonia) 07/07/2017  . Ascites 06/30/2017  . Ascites, malignant 06/30/2017  . Elevated CA-125 01/18/2017  . Depression 12/23/2015  . Peripheral edema 08/23/2015  . Fracture of hip,  left, closed ([place]) 12/08/2013  . Hip fracture ([place]) 12/08/2013  . PAD (peripheral artery disease) ([place]) 11/25/2013  . Encounter for therapeutic drug monitoring 11/09/2013  . Cough 03/20/2013  . Total knee replacement status 10/07/2012  . Knee pain 10/07/2012  . Knee stiffness 10/07/2012  . Tachycardia 09/17/2012  . Chest pain 09/17/2012  . Difficulty in walking(719.7) 09/16/2012  . Muscle weakness (generalized) 09/16/2012  . Postop Acute blood loss anemia 08/08/2012  . Instability of prosthetic knee ([place]) 08/06/2012  . Bloating 02/14/2012  . Upper abdominal pain 02/14/2012  . Allergic rhinitis, seasonal 02/15/2011  . COLITIS 02/20/2010  . Diarrhea 01/02/2010  . ABDOMINAL PAIN -GENERALIZED 01/02/2010  . PERSONAL HX COLONIC POLYPS 01/02/2010  . Hypothyroidism 12/28/2009  . COPD (chronic  obstructive pulmonary disease) ([place]) 12/28/2009  . ARTHRITIS 12/28/2009        Geraldine Solar PT, DPT  [facility] [address] [place], [place], [address] Phone: [phone]   Fax:  [phone]  Name: [name] MRN: [id number] Date of Birth: 27-Jul-1933

## 2018-06-03 ENCOUNTER — Ambulatory Visit (INDEPENDENT_AMBULATORY_CARE_PROVIDER_SITE_OTHER)
Admission: RE | Admit: 2018-06-03 | Discharge: 2018-06-03 | Disposition: A | Discharge status: Home / Self Care | Payer: Medicare Other | Source: Ambulatory Visit | Attending: Emergency Medicine | Admitting: Emergency Medicine

## 2018-06-03 ENCOUNTER — Other Ambulatory Visit: Payer: Self-pay | Admitting: Hematology and Oncology

## 2018-06-03 ENCOUNTER — Encounter: Payer: Self-pay | Admitting: Emergency Medicine

## 2018-06-03 ENCOUNTER — Ambulatory Visit (INDEPENDENT_AMBULATORY_CARE_PROVIDER_SITE_OTHER): Payer: Medicare Other | Admitting: Emergency Medicine

## 2018-06-03 ENCOUNTER — Telehealth: Payer: Self-pay

## 2018-06-03 VITALS — BP 116/60 | HR 99 | Ht 64.0 in | Wt 166.0 lb

## 2018-06-03 DIAGNOSIS — J90 Pleural effusion, not elsewhere classified: Secondary | ICD-10-CM

## 2018-06-03 DIAGNOSIS — J449 Chronic obstructive pulmonary disease, unspecified: Secondary | ICD-10-CM | POA: Diagnosis not present

## 2018-06-03 MED ORDER — MORPHINE SULFATE 15 MG PO TABS: 15.0000 mg | ORAL_TABLET | Freq: Four times a day (QID) | ORAL | 0 refills | Status: AC | PRN

## 2018-06-03 NOTE — Assessment & Plan Note (Signed)
Serial thoracenteses with significant symptomatic relief, improvement in dyspnea and coughing but quick recurrence as her effusion has returned.  The fluid is a very mild exudate, no neutrophils, cytology negative on each occasion.  Suspected sympathetic due to her intra-abdominal process.  I talked to her today about the potential options, VATS pleurodesis versus Pleurx catheter.  I suspect that a Pleurx catheter would be easier on her.  I want to refer her to thoracic surgery to discuss the pros and cons of both.  They would like to see Dr. Servando Snare.  I will make the referral.

## 2018-06-03 NOTE — Assessment & Plan Note (Signed)
Overall appears to be stable on Stiolto.  She does have cough that was significant improved after her thoracentesis but then returned.  Some gray mucus.  No purulence.  No wheezing.  I think we can continue her Stiolto, albuterol as needed.

## 2018-06-03 NOTE — Telephone Encounter (Signed)
Called daughter regarding Hydrocodone refill. [name] does take the the Hydrocodone for breakthrough pain. Daughter will pick up Rx tomorrow. She will take Dr. Alvy Bimler recommendation for IR morphine rather than hydrocodone for breakthrough pain.

## 2018-06-03 NOTE — Progress Notes (Signed)
Subjective:    Patient ID: [name], female    DOB: 31-Aug-1933, 84 y.o.   MRN: [id number] HPI       ROV 03/31/18 --follow-up visit for 84 year old woman with a history of COPD, psoriatic arthritis (previous methotrexate), chronic rhinitis and chronic cough.  She also has a history of ovarian cancer for which she is undergoing treatment, hypertension, atrial fibrillation with diastolic dysfunction.  We have followed imaging and thoracentesis cytology for pleural effusion.  She underwent a right thoracentesis  on 03/17/2018, cytology was negative for any evidence of her ovarian cancer. She underwent TAH, and abdominal resection on 6/13 - went well, she is recovering well. She is on Symbicort, rare albuterol use. She is set for a repeat CXR today.  Not on chemo right now, planning to start antihormonal therapy soon.   ROV 06/03/18 --follow-up visit.  The patient is 84 with COPD, psoriatic arthritis on immunosuppressive therapy, hypertension, atrial fibrillation with diastolic dysfunction, chronic rhinitis, chronic cough.  She has ovarian cancer status post TAH and currently on letrazole, surveillance CT scan planned for October.  She has history of thoracentesis for a recurrent right pleural effusion, last was 05/08/18, cytology negative. Temporary breathing relief. Now her dyspnea and cough have returned.  Remains on stiolto.   She is having generalized pain, worse since I last saw her.                                                                                        Objective:   Physical Exam Vitals:   06/03/18 1119  BP: 116/60  Pulse: 99  SpO2: 93%  Weight: 166 lb (75.3 kg)  Height: 5' 4"  (1.626 m)   Gen: Pleasant, overwt, in no distress,  normal affect  ENT: No lesions,  mouth clear,  oropharynx clear, no postnasal drip  Neck: No JVD, no stridor  Lungs: No use of accessory muscles, no wheezes, decreased BS half way up the R side. Cough on a deep insp  Cardiovascular: RRR, heart  sounds normal, no murmur or gallops, no peripheral edema  Musculoskeletal: No deformities, no cyanosis or clubbing  Neuro: alert, non focal  Skin: Warm, no lesions or rashes   Assessment & Plan:  COPD (chronic obstructive pulmonary disease) ([facility]) Overall appears to be stable on Stiolto.  She does have cough that was significant improved after her thoracentesis but then returned.  Some gray mucus.  No purulence.  No wheezing.  I think we can continue her Stiolto, albuterol as needed.  Pleural effusion Serial thoracenteses with significant symptomatic relief, improvement in dyspnea and coughing but quick recurrence as her effusion has returned.  The fluid is a very mild exudate, no neutrophils, cytology negative on each occasion.  Suspected sympathetic due to her intra-abdominal process.  I talked to her today about the potential options, VATS pleurodesis versus Pleurx catheter.  I suspect that a Pleurx catheter would be easier on her.  I want to refer her to thoracic surgery to discuss the pros and cons of both.  They would like to see [name].  I will make the referral.  Baltazar Apo, MD, PhD 06/03/2018,  11:45 AM [facility] [phone] or if no answer [phone]

## 2018-06-03 NOTE — Telephone Encounter (Signed)
I received this request from surescripts I just refilled her morphine Does she needs this? I typically recommend IR morphine rather than hydrocodone for breakthrough pain

## 2018-06-03 NOTE — Patient Instructions (Signed)
Please continue Stiolto once daily as you have been taking it. Keep albuterol available to use 2 puffs if needed for shortness of breath, chest tightness, wheezing. CXR today We will refer you to see Dr. Servando Snare with thoracic surgery to discuss the pros and cons of either right pleurodesis or right sided Pleurx catheter placement. Follow with Dr Lamonte Sakai in 6 - 8 weeks or sooner if you have any problems

## 2018-06-05 ENCOUNTER — Encounter (HOSPITAL_COMMUNITY): Payer: Self-pay | Admitting: Physical Therapy

## 2018-06-05 ENCOUNTER — Ambulatory Visit (HOSPITAL_COMMUNITY): Payer: Medicare Other | Admitting: Physical Therapy

## 2018-06-05 DIAGNOSIS — R29898 Other symptoms and signs involving the musculoskeletal system: Secondary | ICD-10-CM

## 2018-06-05 DIAGNOSIS — M6281 Muscle weakness (generalized): Secondary | ICD-10-CM

## 2018-06-05 DIAGNOSIS — R262 Difficulty in walking, not elsewhere classified: Secondary | ICD-10-CM

## 2018-06-05 DIAGNOSIS — R2689 Other abnormalities of gait and mobility: Secondary | ICD-10-CM

## 2018-06-05 NOTE — Therapy (Addendum)
[place], [place], [address] Phone: [phone]   Fax:  [phone]  Physical Therapy Treatment / Re-assessment  Patient Details  Name: [name] MRN: [id number] Date of Birth: 12-28-1932 Referring Provider: Dr. Heath Lark   Encounter Date: 06/05/2018   Progress Note Reporting Period 05/14/18 to 06/05/18  See note below for Objective Data and Assessment of Progress/Goals.       PT End of Session - 06/05/18 1121    Visit Number  6    Number of Visits  12    Date for PT Re-Evaluation  06/25/18   mini-re-assess 06/04/2018   Authorization Type  Medicare; secondary - Generic Commercial    Authorization Time Period  05/14/18-06/25/2018    Authorization - Visit Number  0    Authorization - Number of Visits  10    PT Start Time  1115    PT Stop Time  1202    PT Time Calculation (min)  47 min    Equipment Utilized During Treatment  Gait belt    Activity Tolerance  Patient tolerated treatment well    Behavior During Therapy  WFL for tasks assessed/performed       Past Medical History:  Diagnosis Date  . Anxiety   . Chronic blood loss anemia    03-04-2018 diverticular bleed and rectal bleeding,  transfused 2 units PRBCs 03-08-2018  . COPD (chronic obstructive pulmonary disease) ([place])    pulmologist-  dr byrum  . Depression   . Diastolic CHF, chronic ([place])    followed by cardiology  . Diverticulosis   . Dyspnea on effort   . Family history of colon cancer   . Family hx of colon cancer   . Fibromyalgia   . Genetic testing 04/07/2018   MyRisk (35 genes) @ [facility] - No pathogenic mutations detected  . GERD (gastroesophageal reflux disease)    03-17-2018 per pt has not had any gerd issues in long time and no meds prn  . Hemorrhoids   . Hiatal hernia   . History of lower GI bleeding 03/04/2018   admission--  dx diverticular bleed and rectal bleed in setting of xarelto-- xarelto stopped and transfused  03-08-2018 2 units PRBCs  . History of rectal polyps   . History of shingles   . Hypothyroidism   . IBS (irritable bowel syndrome)   . Lymphocytic colitis    FOLLOWED BY DR Henrene Pastor  . Malignant ascites    dx at admission 09/ 2018 abdominal s/p  parencentesis 07-01-2017 2.5L,  07-08-2016  2.7L,  07-12-2017  1454m  . Neuropathy due to chemotherapeutic drug ([facility]   . Osteoporosis   . Ovarian cancer ([facility] oncologist-  gors[place]/  dr pGerarda Fraction  chemotherapy 07-19-2017 to 11-04-2017  . [place]oxysmal atrial fibrillation ([place]    CARDIOLOGIST-  DR S. MCDOWELL-  first dx'd 07/ 2014---was taking xarelto up until 03-07-2018 stopped due to lower GI bleed  . Pleural effusion    s/p  right thoracentesis, 02-2018 1.3L and 03-17-2018 right thoracentesis 6473m, post cxr no residual effusion  . [place]soriatic arthritis ([facility]  . Schatzki's ring    S/P  DILATERAL 2013  . Seasonal allergic rhinitis     Past Surgical History:  Procedure Laterality Date  . CARDIOVASCULAR STRESS TEST  09/23/2012   Low risk lexiscan nuclear study w/ apical thinning but no evidence of ischemia/  normal LV function and wall motion , ef 75%  . CATARACT EXTRACTION  W/ INTRAOCULAR LENS  IMPLANT, BILATERAL  10/2016  . COLONOSCOPY    . DEBULKING N/A 03/20/2018   Procedure: DEBULKING;  Surgeon: Isabel Caprice, MD;  Location: [facility];  Service: Gynecology;  Laterality: N/A;  . EXAM UNDER ANESTHESIA WITH MANIPULATION OF KNEE Left 12-20-2003  dr Noemi Chapel   post TKA  . FEMUR IM NAIL Left 12/11/2013   Procedure: INTRAMEDULLARY (IM) NAIL FEMORAL;  Surgeon: [name], MD;  Location: [facility];  Service: Orthopedics;  Laterality: Left;  . HYSTERECTOMY ABDOMINAL WITH SALPINGO-OOPHORECTOMY Bilateral 03/20/2018   Procedure: TOTAL HYSTERECTOMY ABDOMINAL WITH BILATERAL  SALPINGO-OOPHORECTOMY;  Surgeon: Isabel Caprice, MD;  Location: [facility];  Service: Gynecology;  Laterality: Bilateral;  . IR FLUORO GUIDE PORT INSERTION RIGHT  07/22/2017  . IR  PARACENTESIS  07/12/2017  . IR THORACENTESIS ASP PLEURAL SPACE W/IMG GUIDE  03/17/2018  . IR THORACENTESIS ASP PLEURAL SPACE W/IMG GUIDE  05/08/2018  . IR US GUIDE VASC ACCESS RIGHT  07/22/2017  . KNEE ARTHROSCOPY W/ LATERAL RELEASE Left 09-03-2005   dr Noemi Chapel  [facility]   w/  Lysis Adhesions,  excision loose body's  . LAPAROSCOPIC CHOLECYSTECTOMY  12-04-2010  dr zeigler  . LAPAROTOMY N/A 03/20/2018   Procedure: EXPLORATORY LAPAROTOMY;  Surgeon: Isabel Caprice, MD;  [facility]: [facility];  Service: Gynecology;  Laterality: N/A;  . OMENTECTOMY N/A 03/20/2018   Procedure: OMENTECTOMY;  Surgeon: Isabel Caprice, MD;  Location: [facility];  Service: Gynecology;  Laterality: N/A;  . TOTAL KNEE ARTHROPLASTY Left 09-08-2003   dr Noemi Chapel  [facility]  . TOTAL KNEE REVISION  08/06/2012   Procedure: TOTAL KNEE REVISION;  Surgeon: [name], MD;  Location: [facility];  Service: Orthopedics;  Laterality: Left;  Left Total Knee Arthroplasty Revision  . TRANSTHORACIC ECHOCARDIOGRAM  08/20/2017   ef 60-65%,  grade 1 diastolic dysfunction/  trivial AR and TR  . Uterine polypectomy      There were no vitals filed for this visit.  Subjective Assessment - 06/05/18 1119    Subjective  Patient reported that she is having pain in her low back and buttocks.     How long can you sit comfortably?  not limited    How long can you stand comfortably?  5-6 minutes    How long can you walk comfortably?  5 minutes with RW    Currently in Pain?  Yes    Pain Score  5     Pain Location  Back    Pain Orientation  Right;Left    Pain Descriptors / Indicators  Aching    Pain Type  Chronic pain         [facility] PT Assessment - 06/05/18 0001      Assessment   Medical Diagnosis  Generalized Weakness    Referring Provider  Dr. Heath Lark      Cognition   Overall Cognitive Status  Within Functional Limits for tasks assessed      Strength   Right Hip Flexion  4/5   was 4+/5   Right Hip Extension  4-/5   was 4-/5   Right Hip ABduction   4+/5   was 4+/5   Left Hip Flexion  4+/5    Left Hip Extension  3+/5   was 3/5   Left Hip ABduction  4+/5   was 4+/5   Right Knee Flexion  4+/5   was 4+   Right Knee Extension  5/5   was 5   Left Knee Flexion  4+/5   was 4+   Left Knee Extension  5/5   was 5   Right Ankle Dorsiflexion  4+/5   was 4+/5   Left Ankle Dorsiflexion  4+/5   was 4+/5     Ambulation/Gait   Ambulation Distance (Feet)  228 Feet   2MWT   Assistive device  Rolling walker    Gait Pattern  Step-through pattern;Decreased hip/knee flexion - left;Decreased stride length;Trunk flexed      Static Standing Balance   Static Standing - Balance Support  No upper extremity supported    Static Standing Balance -  Activities   Single Leg Stance - Right Leg;Single Leg Stance - Left Leg    Static Standing - Comment/# of Minutes  SLS Rt: 8 seconds; SLS Lt: 4 seconds      Standardized Balance Assessment   Five times sit to stand comments   15.48 seconds      Timed Up and Go Test   Normal TUG (seconds)  30.08   With rolling walker                  [facility] Adult PT Treatment/Exercise - 06/05/18 0001      Knee/Hip Exercises: Standing   Heel Raises  Both;20 reps    Heel Raises Limitations  heel and toe    Hip Flexion  Both;15 reps    Hip Flexion Limitations  1 HHA, 3" holds    Forward Lunges  Both;15 reps    Forward Lunges Limitations  onto 4" box, intermittent UE support               PT Short Term Goals - 06/05/18 1244      PT SHORT TERM GOAL #1   Title  Patient and caregivers will be independent in initial HEP with routine performance.     Baseline  06/05/18: Patient reported trying to do exercises at home.     Time  3    Period  Weeks    Status  On-going      PT SHORT TERM GOAL #2   Title  Patient with tolerate the standing position with upper extremity support for 5 minutes before needing to rest to improve her ability to perform standing functional activities and ADLs within her home  environment.    Baseline  06/05/18: Patient reported being able to stand for 5-6 minutes.     Time  3    Period  Weeks    Status  Achieved      PT SHORT TERM GOAL #3   Title  Patient will increase MMT grades by 1/2 grade in deficient musculature to show improvement in her strength and stability to perform functional activities.    Baseline  06/05/18: Patient demonstrated improvement in some, but not all muscle groups. See MMT.     Time  3    Period  Weeks    Status  On-going      PT SHORT TERM GOAL #4   Title  Patient will be able to stand on either foot for 12 seconds to exhibit improved balance and decreased risk for falls.    Baseline  06/05/18: 8 seconds on the right, 4 seconds on the left    Time  3    Period  Weeks    Status  On-going        PT Long Term Goals - 06/05/18 1246      PT LONG TERM GOAL #1   Title  Patient will ambulate >300 feet with LRAD during the 2 MWT to improve her ability to ambulate further in community to allow to her go enjoy going out for a meal.    Baseline  06/05/18: Patient ambulated 228 feet during 2MWT with rolling walker this session.     Time  6    Period  Weeks    Status  On-going      PT LONG TERM GOAL #2   Title  Patient with tolerate the standing position with upper extremity support for 10 minutes before needing to rest to improve her ability to stand and have a conversation with a friend or relative.    Baseline  06/05/18: Patient reported ability to stand for 5-6 minutes.     Time  6    Period  Weeks    Status  On-going      PT LONG TERM GOAL #3   Title  Patient will increase MMT grades by 1 grade in deficent musculature to show continued improvement in her strength and stability to perform functional activities at home independently.    Baseline  06/05/18: Patient demonstrated improvement in some, but not all muscle groups. See MMT.     Time  6    Period  Weeks    Status  On-going      PT LONG TERM GOAL #4   Title  Patient will be  able to stand on either foot for 16 seconds to exhibit improved balance and further decreased risk for falls.    Baseline  06/05/18: 8 seconds on the right, 4 seconds on the left    Time  6    Period  Weeks    Status  On-going      PT LONG TERM GOAL #5   Title  Pt will be able to perform 5xSTS in 12 sec or < with no UE in order to demo improved functional BLE strength and overall balance.    Baseline  06/05/18: Patient performed in 15.48 seconds.     Time  6    Period  Weeks    Status  On-going      PT LONG TERM GOAL #6   Title  Pt will report requiring assistance for ADLs and IADLs at home 25% of the time or < to demonstrate improved overall functional strength, endurance, and balance in order to promote independence at home.    Baseline  06/05/18: Patient reported requiring assistance 50% of the time.     Time  6    Period  Weeks    Status  On-going            Plan - 06/05/18 1253    Clinical Impression Statement  This session performed a re-assessment of patient's progress towards goals. Patient has achieved 1 out of 4 short term goals. Patient is still on-going with progress on 6 out of 6 long term goals. Patient still demonstrates deficits in strength, balance, endurance, and overall functional mobility. Monitored patient's oxygen saturation throughout the session with patient's oxygen saturation ranging from 90-97% throughout session. With 90% being following the 2 MWT, and therapist providing rest breaks as needed for patient's oxygen to return to therapeutic range. The remainder of the session, patient performed exercises to improve lower extremity strength. Patient would benefit from continued skilled physical therapy in order to continue addressing deficits and help patient return to prior level of function.     Rehab Potential  Good    PT Frequency  2x / week    PT Duration  6 weeks    PT Treatment/Interventions  ADLs/Self Care Home Management;Gait training;Stair  training;Functional mobility training;Therapeutic activities;Patient/family education;Balance training;Neuromuscular re-education;Therapeutic exercise;Manual techniques;Energy conservation    PT Next Visit Plan  Progress closed chain exercises as able; continue balance activity. inquire if used her script for a rollator    Consulted and Agree with Plan of Care  Patient       Patient will benefit from skilled therapeutic intervention in order to improve the following deficits and impairments:  Abnormal gait, Pain, Decreased mobility, Decreased coordination, Decreased activity tolerance, Decreased endurance, Decreased strength, Decreased balance, Difficulty walking  Visit Diagnosis: Muscle weakness (generalized)  Other abnormalities of gait and mobility  Other symptoms and signs involving the musculoskeletal system  Difficulty in walking, not elsewhere classified     Problem List Patient Active Problem List   Diagnosis Date Noted  . Other constipation 05/28/2018  . Psoriatic arthritis ([place]) 04/22/2018  . Genetic testing 04/07/2018  . Family history of colon cancer   . History of GI diverticular bleed 03/12/2018  . Lower GI bleed   . Diverticulosis of colon with hemorrhage   . Rectal bleeding 03/04/2018  . Acute GI bleeding 03/04/2018  . Thrombocytopenia ([place]) 01/03/2018  . Anemia, chronic disease 12/13/2017  . Mild protein-calorie malnutrition ([place]) 12/13/2017  . Peripheral neuropathy due to chemotherapy ([place]) 12/13/2017  . S/P thoracentesis   . Atrial fibrillation with RVR ([place]) 11/08/2017  . HCAP (healthcare-associated pneumonia) 11/08/2017  . Pleural effusion 09/20/2017  . Acute on chronic diastolic heart failure ([place]) 08/19/2017  . Pancytopenia, acquired ([place]) 08/15/2017  . Protein-calorie malnutrition, moderate ([place]) 07/30/2017  . Generalized weakness 07/30/2017  . Antineoplastic chemotherapy induced pancytopenia ([place]) 07/20/2017  . Left ovarian epithelial cancer ([place])  07/18/2017  . PNA (pneumonia) 07/07/2017  . Ascites 06/30/2017  . Ascites, malignant 06/30/2017  . Elevated CA-125 01/18/2017  . Depression 12/23/2015  . Peripheral edema 08/23/2015  . Fracture of hip, left, closed ([place]) 12/08/2013  . Hip fracture ([place]) 12/08/2013  . PAD (peripheral artery disease) ([place]) 11/25/2013  . Encounter for therapeutic drug monitoring 11/09/2013  . Cough 03/20/2013  . Total knee replacement status 10/07/2012  . Knee pain 10/07/2012  . Knee stiffness 10/07/2012  . Tachycardia 09/17/2012  . Chest pain 09/17/2012  . Difficulty in walking(719.7) 09/16/2012  . Muscle weakness (generalized) 09/16/2012  . Postop Acute blood loss anemia 08/08/2012  . Instability of prosthetic knee ([place]) 08/06/2012  . Bloating 02/14/2012  . Upper abdominal pain 02/14/2012  . Allergic rhinitis, seasonal 02/15/2011  . COLITIS 02/20/2010  . Diarrhea 01/02/2010  . ABDOMINAL PAIN -GENERALIZED 01/02/2010  . PERSONAL HX COLONIC POLYPS 01/02/2010  . Hypothyroidism 12/28/2009  . COPD (chronic obstructive pulmonary disease) ([place]) 12/28/2009  . ARTHRITIS 12/28/2009   Clarene Critchley PT, DPT 12:56 PM, 06/05/18 [facility] [address] [place], [place], [address] Phone: [phone]   Fax:  [phone]  Name: [name] MRN: [id number] Date of Birth: 01-24-1933

## 2018-06-06 MED FILL — MORPHINE SULFATE IR 15 MG T: 15 | 15 days supply | Qty: 60 | Fill #0

## 2018-06-10 ENCOUNTER — Encounter (HOSPITAL_COMMUNITY): Payer: Self-pay

## 2018-06-10 ENCOUNTER — Ambulatory Visit (HOSPITAL_COMMUNITY): Payer: Medicare Other | Attending: Hematology and Oncology

## 2018-06-10 DIAGNOSIS — R2689 Other abnormalities of gait and mobility: Secondary | ICD-10-CM | POA: Diagnosis present

## 2018-06-10 DIAGNOSIS — M6281 Muscle weakness (generalized): Secondary | ICD-10-CM

## 2018-06-10 DIAGNOSIS — R262 Difficulty in walking, not elsewhere classified: Secondary | ICD-10-CM | POA: Insufficient documentation

## 2018-06-10 DIAGNOSIS — R29898 Other symptoms and signs involving the musculoskeletal system: Secondary | ICD-10-CM | POA: Insufficient documentation

## 2018-06-10 NOTE — Therapy (Signed)
[place], [place], [address] Phone: [phone]   Fax:  [phone]  Physical Therapy Treatment  Patient Details  Name: [name] MRN: [id number] Date of Birth: March 19, 1933 Referring Provider: Dr. Heath Lark   Encounter Date: 06/10/2018  PT End of Session - 06/10/18 1031    Visit Number  7    Number of Visits  12    Date for PT Re-Evaluation  06/25/18   mini-re-assess 06/04/2018   Authorization Type  Medicare; secondary - Generic Commercial    Authorization Time Period  05/14/18-06/25/2018    Authorization - Visit Number  1    Authorization - Number of Visits  10    PT Start Time  1030    PT Stop Time  1112    PT Time Calculation (min)  42 min    Equipment Utilized During Treatment  Gait belt    Activity Tolerance  Patient tolerated treatment well    Behavior During Therapy  [facility] for tasks assessed/performed       Past Medical History:  Diagnosis Date  . Anxiety   . Chronic blood loss anemia    03-04-2018 diverticular bleed and rectal bleeding,  transfused 2 units PRBCs 03-08-2018  . COPD (chronic obstructive pulmonary disease) ([place])    pulmologist-  dr byrum  . Depression   . Diastolic CHF, chronic ([place])    followed by cardiology  . Diverticulosis   . Dyspnea on effort   . Family history of colon cancer   . Family hx of colon cancer   . Fibromyalgia   . Genetic testing 04/07/2018   MyRisk (35 genes) @ [facility] - No pathogenic mutations detected  . GERD (gastroesophageal reflux disease)    03-17-2018 per pt has not had any gerd issues in long time and no meds prn  . Hemorrhoids   . Hiatal hernia   . History of lower GI bleeding 03/04/2018   admission--  dx diverticular bleed and rectal bleed in setting of xarelto-- xarelto stopped and transfused 03-08-2018 2 units PRBCs  . History of rectal polyps   . History of shingles   . Hypothyroidism   . IBS (irritable bowel syndrome)   . Lymphocytic  colitis    FOLLOWED BY DR Henrene Pastor  . Malignant ascites    dx at admission 09/ 2018 abdominal s/p  parencentesis 07-01-2017 2.5L,  07-08-2016  2.7L,  07-12-2017  1466m  . Neuropathy due to chemotherapeutic drug ([place]   . Osteoporosis   . Ovarian cancer ([facility] oncologist-  gorsuch/  dr pGerarda Fraction  chemotherapy 07-19-2017 to 11-04-2017  . Paroxysmal atrial fibrillation ([place]    CARDIOLOGIST-  DR S. MCDOWELL-  first dx'd 07/ 2014---was taking xarelto up until 03-07-2018 stopped due to lower GI bleed  . Pleural effusion    s/p  right thoracentesis, 02-2018 1.3L and 03-17-2018 right thoracentesis 643m, post cxr no residual effusion  . Psoriatic arthritis ([place]  . Schatzki's ring    S/P  DILATERAL 2013  . Seasonal allergic rhinitis     Past Surgical History:  Procedure Laterality Date  . CARDIOVASCULAR STRESS TEST  09/23/2012   Low risk lexiscan nuclear study w/ apical thinning but no evidence of ischemia/  normal LV function and wall motion , ef 75%  . CATARACT EXTRACTION W/ INTRAOCULAR LENS  IMPLANT, BILATERAL  10/2016  . COLONOSCOPY    . DEBULKING N/A 03/20/2018   Procedure: DEBULKING;  Surgeon: PhIsabel Caprice  MD;  Location: [facility];  Service: Gynecology;  Laterality: N/A;  . EXAM UNDER ANESTHESIA WITH MANIPULATION OF KNEE Left 12-20-2003  dr Noemi Chapel   post TKA  . FEMUR IM NAIL Left 12/11/2013   Procedure: INTRAMEDULLARY (IM) NAIL FEMORAL;  Surgeon: [name], MD;  Location: [facility];  Service: Orthopedics;  Laterality: Left;  . HYSTERECTOMY ABDOMINAL WITH SALPINGO-OOPHORECTOMY Bilateral 03/20/2018   Procedure: TOTAL HYSTERECTOMY ABDOMINAL WITH BILATERAL  SALPINGO-OOPHORECTOMY;  Surgeon: Isabel Caprice, MD;  Location: [facility];  Service: Gynecology;  Laterality: Bilateral;  . IR FLUORO GUIDE PORT INSERTION RIGHT  07/22/2017  . IR PARACENTESIS  07/12/2017  . IR THORACENTESIS ASP PLEURAL SPACE W/IMG GUIDE  03/17/2018  . IR THORACENTESIS ASP PLEURAL SPACE W/IMG GUIDE  05/08/2018  . IR US  GUIDE VASC ACCESS RIGHT  07/22/2017  . KNEE ARTHROSCOPY W/ LATERAL RELEASE Left 09-03-2005   dr Noemi Chapel  [facility]   w/  Lysis Adhesions,  excision loose body's  . LAPAROSCOPIC CHOLECYSTECTOMY  12-04-2010  dr zeigler  . LAPAROTOMY N/A 03/20/2018   Procedure: EXPLORATORY LAPAROTOMY;  Surgeon: Isabel Caprice, MD;  [facility]: [facility];  Service: Gynecology;  Laterality: N/A;  . OMENTECTOMY N/A 03/20/2018   Procedure: OMENTECTOMY;  Surgeon: Isabel Caprice, MD;  Location: [facility];  Service: Gynecology;  Laterality: N/A;  . TOTAL KNEE ARTHROPLASTY Left 09-08-2003   dr Noemi Chapel  [facility]  . TOTAL KNEE REVISION  08/06/2012   Procedure: TOTAL KNEE REVISION;  Surgeon: [name], MD;  Location: [facility];  Service: Orthopedics;  Laterality: Left;  Left Total Knee Arthroplasty Revision  . TRANSTHORACIC ECHOCARDIOGRAM  08/20/2017   ef 60-65%,  grade 1 diastolic dysfunction/  trivial AR and TR  . Uterine polypectomy      There were no vitals filed for this visit.  Subjective Assessment - 06/10/18 1032    Subjective  Pt reports that she saw her pulmonary doctor last week who is sending her to see a thoracic surgeon and she has that appointment next week. She states that she is still having generalized high pain scale in mm and joints.    How long can you sit comfortably?  not limited    How long can you stand comfortably?  5-6 minutes    How long can you walk comfortably?  5 minutes with RW    Currently in Pain?  Yes    Pain Score  6     Pain Location  Generalized    Pain Descriptors / Indicators  Aching    Pain Type  Chronic pain    Pain Onset  More than a month ago    Pain Frequency  Constant    Aggravating Factors   not sure but she thinks the hormonal therapy that she is taking is aggravating it    Pain Relieving Factors  rest    Effect of Pain on Daily Activities  limits            [facility] Adult PT Treatment/Exercise - 06/10/18 0001      Exercises   Exercises  Knee/Hip      Knee/Hip Exercises:  Standing   Heel Raises  Both;20 reps    Heel Raises Limitations  heel and toe on incline    Forward Step Up  Both;10 reps;Step Height: 4";Hand Hold: 0    Wall Squat  10 reps    Gait Training  gait with SPC x175f with 1 standing rest break due to SOB  Balance Exercises - 06/10/18 1054      Balance Exercises: Standing   SLS with Vectors  Solid surface;Intermittent upper extremity assist;5 reps    Sidestepping  Foam/compliant support;2 reps   in // bars           PT Education - 06/10/18 1039    Education provided  Yes    Education Details  exercise technique, updated HEP    Person(s) Educated  Patient    Methods  Explanation;Demonstration;Handout    Comprehension  Verbalized understanding;Returned demonstration       PT Short Term Goals - 06/05/18 1244      PT SHORT TERM GOAL #1   Title  Patient and caregivers will be independent in initial HEP with routine performance.     Baseline  06/05/18: Patient reported trying to do exercises at home.     Time  3    Period  Weeks    Status  On-going      PT SHORT TERM GOAL #2   Title  Patient with tolerate the standing position with upper extremity support for 5 minutes before needing to rest to improve her ability to perform standing functional activities and ADLs within her home environment.    Baseline  06/05/18: Patient reported being able to stand for 5-6 minutes.     Time  3    Period  Weeks    Status  Achieved      PT SHORT TERM GOAL #3   Title  Patient will increase MMT grades by 1/2 grade in deficient musculature to show improvement in her strength and stability to perform functional activities.    Baseline  06/05/18: Patient demonstrated improvement in some, but not all muscle groups. See MMT.     Time  3    Period  Weeks    Status  On-going      PT SHORT TERM GOAL #4   Title  Patient will be able to stand on either foot for 12 seconds to exhibit improved balance and decreased risk for falls.    Baseline   06/05/18: 8 seconds on the right, 4 seconds on the left    Time  3    Period  Weeks    Status  On-going        PT Long Term Goals - 06/05/18 1246      PT LONG TERM GOAL #1   Title  Patient will ambulate >300 feet with LRAD during the 2 MWT to improve her ability to ambulate further in community to allow to her go enjoy going out for a meal.    Baseline  06/05/18: Patient ambulated 228 feet during 2MWT with rolling walker this session.     Time  6    Period  Weeks    Status  On-going      PT LONG TERM GOAL #2   Title  Patient with tolerate the standing position with upper extremity support for 10 minutes before needing to rest to improve her ability to stand and have a conversation with a friend or relative.    Baseline  06/05/18: Patient reported ability to stand for 5-6 minutes.     Time  6    Period  Weeks    Status  On-going      PT LONG TERM GOAL #3   Title  Patient will increase MMT grades by 1 grade in deficent musculature to show continued improvement in her strength and stability to perform functional activities  at home independently.    Baseline  06/05/18: Patient demonstrated improvement in some, but not all muscle groups. See MMT.     Time  6    Period  Weeks    Status  On-going      PT LONG TERM GOAL #4   Title  Patient will be able to stand on either foot for 16 seconds to exhibit improved balance and further decreased risk for falls.    Baseline  06/05/18: 8 seconds on the right, 4 seconds on the left    Time  6    Period  Weeks    Status  On-going      PT LONG TERM GOAL #5   Title  Pt will be able to perform 5xSTS in 12 sec or < with no UE in order to demo improved functional BLE strength and overall balance.    Baseline  06/05/18: Patient performed in 15.48 seconds.     Time  6    Period  Weeks    Status  On-going      PT LONG TERM GOAL #6   Title  Pt will report requiring assistance for ADLs and IADLs at home 25% of the time or < to demonstrate improved  overall functional strength, endurance, and balance in order to promote independence at home.    Baseline  06/05/18: Patient reported requiring assistance 50% of the time.     Time  6    Period  Weeks    Status  On-going            Plan - 06/10/18 1110    Clinical Impression Statement  Continued with established POC focusing on gait endurance, balance, and functional strengthening. Pt able to perform step ups without HHA this date and able to add wall slides for BLE strengthening and pt challenged with this due to mm fatigue. Pt continues to be challenged with balance activities but able to progress balance activities this date. She required multiple seated rest breaks during therapy due to mm fatigue and SOB. Pt denied any increase in pain at EOS, just fatigue. O2 remained >91% and HR <115 throughout entire session. Continue as planned, progressing as able.     Rehab Potential  Good    PT Frequency  2x / week    PT Duration  6 weeks    PT Treatment/Interventions  ADLs/Self Care Home Management;Gait training;Stair training;Functional mobility training;Therapeutic activities;Patient/family education;Balance training;Neuromuscular re-education;Therapeutic exercise;Manual techniques;Energy conservation    PT Next Visit Plan  add resisted fwd/retro amb at multigym; Progress closed chain exercises as able; continue balance activity. inquire if used her script for a rollator    PT Home Exercise Plan  9/3: wall squats    Consulted and Agree with Plan of Care  Patient       Patient will benefit from skilled therapeutic intervention in order to improve the following deficits and impairments:  Abnormal gait, Pain, Decreased mobility, Decreased coordination, Decreased activity tolerance, Decreased endurance, Decreased strength, Decreased balance, Difficulty walking  Visit Diagnosis: Muscle weakness (generalized)  Other abnormalities of gait and mobility  Other symptoms and signs involving the  musculoskeletal system  Difficulty in walking, not elsewhere classified     Problem List Patient Active Problem List   Diagnosis Date Noted  . Other constipation 05/28/2018  . Psoriatic arthritis ([place]) 04/22/2018  . Genetic testing 04/07/2018  . Family history of colon cancer   . History of GI diverticular bleed 03/12/2018  . Lower GI  bleed   . Diverticulosis of colon with hemorrhage   . Rectal bleeding 03/04/2018  . Acute GI bleeding 03/04/2018  . Thrombocytopenia ([place]) 01/03/2018  . Anemia, chronic disease 12/13/2017  . Mild protein-calorie malnutrition ([place]) 12/13/2017  . Peripheral neuropathy due to chemotherapy ([place]) 12/13/2017  . S/P thoracentesis   . Atrial fibrillation with RVR ([place]) 11/08/2017  . HCAP (healthcare-associated pneumonia) 11/08/2017  . Pleural effusion 09/20/2017  . Acute on chronic diastolic heart failure ([place]) 08/19/2017  . Pancytopenia, acquired ([place]) 08/15/2017  . Protein-calorie malnutrition, moderate ([place]) 07/30/2017  . Generalized weakness 07/30/2017  . Antineoplastic chemotherapy induced pancytopenia ([place]) 07/20/2017  . Left ovarian epithelial cancer ([place]) 07/18/2017  . PNA (pneumonia) 07/07/2017  . Ascites 06/30/2017  . Ascites, malignant 06/30/2017  . Elevated CA-125 01/18/2017  . Depression 12/23/2015  . Peripheral edema 08/23/2015  . Fracture of hip, left, closed ([place]) 12/08/2013  . Hip fracture ([place]) 12/08/2013  . PAD (peripheral artery disease) ([place]) 11/25/2013  . Encounter for therapeutic drug monitoring 11/09/2013  . Cough 03/20/2013  . Total knee replacement status 10/07/2012  . Knee pain 10/07/2012  . Knee stiffness 10/07/2012  . Tachycardia 09/17/2012  . Chest pain 09/17/2012  . Difficulty in walking(719.7) 09/16/2012  . Muscle weakness (generalized) 09/16/2012  . Postop Acute blood loss anemia 08/08/2012  . Instability of prosthetic knee ([place]) 08/06/2012  . Bloating 02/14/2012  . Upper abdominal pain 02/14/2012  .  Allergic rhinitis, seasonal 02/15/2011  . COLITIS 02/20/2010  . Diarrhea 01/02/2010  . ABDOMINAL PAIN -GENERALIZED 01/02/2010  . PERSONAL HX COLONIC POLYPS 01/02/2010  . Hypothyroidism 12/28/2009  . COPD (chronic obstructive pulmonary disease) ([place]) 12/28/2009  . ARTHRITIS 12/28/2009      Geraldine Solar PT, DPT  [facility] [address] [place], [place], [address] Phone: [phone]   Fax:  [phone]  Name: [name] MRN: [id number] Date of Birth: 1933-05-08

## 2018-06-10 NOTE — Patient Instructions (Signed)
Access Code: [email or web address]  URL: [email or web address]  Date: 06/10/2018  Prepared by: [place] - 10 reps - 3 sets - 1x daily - 7x weekly

## 2018-06-12 ENCOUNTER — Ambulatory Visit (HOSPITAL_COMMUNITY): Payer: Medicare Other

## 2018-06-12 ENCOUNTER — Encounter (HOSPITAL_COMMUNITY): Payer: Self-pay

## 2018-06-12 DIAGNOSIS — R262 Difficulty in walking, not elsewhere classified: Secondary | ICD-10-CM

## 2018-06-12 DIAGNOSIS — R29898 Other symptoms and signs involving the musculoskeletal system: Secondary | ICD-10-CM

## 2018-06-12 DIAGNOSIS — M6281 Muscle weakness (generalized): Secondary | ICD-10-CM

## 2018-06-12 DIAGNOSIS — R2689 Other abnormalities of gait and mobility: Secondary | ICD-10-CM

## 2018-06-12 NOTE — Therapy (Signed)
[place], [place], [address] Phone: [phone]   Fax:  [phone]  Physical Therapy Treatment  Patient Details  Name: [name] MRN: [id number] Date of Birth: Oct 24, 1932 Referring Provider: Dr. Heath Lark   Encounter Date: 06/12/2018  PT End of Session - 06/12/18 1125    Visit Number  8    Number of Visits  12    Date for PT Re-Evaluation  06/25/18   mini-re-assess 06/04/2018   Authorization Type  Medicare; secondary - Generic Commercial    Authorization Time Period  05/14/18-06/25/2018    Authorization - Visit Number  2    Authorization - Number of Visits  10    PT Start Time  1120    PT Stop Time  1201    PT Time Calculation (min)  41 min    Equipment Utilized During Treatment  Gait belt    Activity Tolerance  Patient tolerated treatment well    Behavior During Therapy  [facility] for tasks assessed/performed       Past Medical History:  Diagnosis Date  . Anxiety   . Chronic blood loss anemia    03-04-2018 diverticular bleed and rectal bleeding,  transfused 2 units PRBCs 03-08-2018  . COPD (chronic obstructive pulmonary disease) ([place])    pulmologist-  dr byrum  . Depression   . Diastolic CHF, chronic ([place])    followed by cardiology  . Diverticulosis   . Dyspnea on effort   . Family history of colon cancer   . Family hx of colon cancer   . Fibromyalgia   . Genetic testing 04/07/2018   MyRisk (35 genes) @ [facility] - No pathogenic mutations detected  . GERD (gastroesophageal reflux disease)    03-17-2018 per pt has not had any gerd issues in long time and no meds prn  . Hemorrhoids   . Hiatal hernia   . History of lower GI bleeding 03/04/2018   admission--  dx diverticular bleed and rectal bleed in setting of xarelto-- xarelto stopped and transfused 03-08-2018 2 units PRBCs  . History of rectal polyps   . History of shingles   . Hypothyroidism   . IBS (irritable bowel syndrome)   . Lymphocytic  colitis    FOLLOWED BY DR Henrene Pastor  . Malignant ascites    dx at admission 09/ 2018 abdominal s/p  parencentesis 07-01-2017 2.5L,  07-08-2016  2.7L,  07-12-2017  1482m  . Neuropathy due to chemotherapeutic drug ([place]   . Osteoporosis   . Ovarian cancer ([facility] oncologist-  gorsuch/  dr pGerarda Fraction  chemotherapy 07-19-2017 to 11-04-2017  . Paroxysmal atrial fibrillation ([place]    CARDIOLOGIST-  DR S. MCDOWELL-  first dx'd 07/ 2014---was taking xarelto up until 03-07-2018 stopped due to lower GI bleed  . Pleural effusion    s/p  right thoracentesis, 02-2018 1.3L and 03-17-2018 right thoracentesis 6420m, post cxr no residual effusion  . Psoriatic arthritis ([place]  . Schatzki's ring    S/P  DILATERAL 2013  . Seasonal allergic rhinitis     Past Surgical History:  Procedure Laterality Date  . CARDIOVASCULAR STRESS TEST  09/23/2012   Low risk lexiscan nuclear study w/ apical thinning but no evidence of ischemia/  normal LV function and wall motion , ef 75%  . CATARACT EXTRACTION W/ INTRAOCULAR LENS  IMPLANT, BILATERAL  10/2016  . COLONOSCOPY    . DEBULKING N/A 03/20/2018   Procedure: DEBULKING;  Surgeon: PhIsabel Caprice  MD;  Location: [facility];  Service: Gynecology;  Laterality: N/A;  . EXAM UNDER ANESTHESIA WITH MANIPULATION OF KNEE Left 12-20-2003  dr Noemi Chapel   post TKA  . FEMUR IM NAIL Left 12/11/2013   Procedure: INTRAMEDULLARY (IM) NAIL FEMORAL;  Surgeon: [name], MD;  Location: [facility];  Service: Orthopedics;  Laterality: Left;  . HYSTERECTOMY ABDOMINAL WITH SALPINGO-OOPHORECTOMY Bilateral 03/20/2018   Procedure: TOTAL HYSTERECTOMY ABDOMINAL WITH BILATERAL  SALPINGO-OOPHORECTOMY;  Surgeon: Isabel Caprice, MD;  Location: [facility];  Service: Gynecology;  Laterality: Bilateral;  . IR FLUORO GUIDE PORT INSERTION RIGHT  07/22/2017  . IR PARACENTESIS  07/12/2017  . IR THORACENTESIS ASP PLEURAL SPACE W/IMG GUIDE  03/17/2018  . IR THORACENTESIS ASP PLEURAL SPACE W/IMG GUIDE  05/08/2018  . IR US  GUIDE VASC ACCESS RIGHT  07/22/2017  . KNEE ARTHROSCOPY W/ LATERAL RELEASE Left 09-03-2005   dr Noemi Chapel  [facility]   w/  Lysis Adhesions,  excision loose body's  . LAPAROSCOPIC CHOLECYSTECTOMY  12-04-2010  dr zeigler  . LAPAROTOMY N/A 03/20/2018   Procedure: EXPLORATORY LAPAROTOMY;  Surgeon: Isabel Caprice, MD;  [facility]: [facility];  Service: Gynecology;  Laterality: N/A;  . OMENTECTOMY N/A 03/20/2018   Procedure: OMENTECTOMY;  Surgeon: Isabel Caprice, MD;  [facility]: [facility];  Service: Gynecology;  Laterality: N/A;  . TOTAL KNEE ARTHROPLASTY Left 09-08-2003   dr Noemi Chapel  [facility]  . TOTAL KNEE REVISION  08/06/2012   Procedure: TOTAL KNEE REVISION;  Surgeon: [name], MD;  Location: [facility];  Service: Orthopedics;  Laterality: Left;  Left Total Knee Arthroplasty Revision  . TRANSTHORACIC ECHOCARDIOGRAM  08/20/2017   ef 60-65%,  grade 1 diastolic dysfunction/  trivial AR and TR  . Uterine polypectomy      There were no vitals filed for this visit.  Subjective Assessment - 06/12/18 1125    Subjective  Pt reports that she feels a little better than Tuesday but still having some generalized pain.    How long can you sit comfortably?  not limited    How long can you stand comfortably?  5-6 minutes    How long can you walk comfortably?  5 minutes with RW    Currently in Pain?  Yes    Pain Score  4     Pain Location  Generalized    Pain Descriptors / Indicators  Aching    Pain Onset  More than a month ago    Pain Frequency  Constant    Aggravating Factors   not sure but she thinks the hormonal therapy that she is taking is aggravating it    Pain Relieving Factors  rest    Effect of Pain on Daily Activities  limits               [facility] Adult PT Treatment/Exercise - 06/12/18 0001      Exercises   Exercises  Knee/Hip      Knee/Hip Exercises: Machines for Strengthening   Other Machine  fwd resisted walking at multigym 10# x2RT, retro resisted walking at multigym 20# x2RT      Knee/Hip  Exercises: Standing   Hip Abduction  Both;10 reps    Abduction Limitations  RTB    Hip Extension  Both;10 reps    Extension Limitations  diagonals, RTB    Lateral Step Up  Both;10 reps;Step Height: 4";Hand Hold: 1    Forward Step Up  Both;10 reps;Step Height: 4";Hand Hold: 0    Gait Training  gait with SPC x254f no rest breaks needed      Knee/Hip Exercises: Seated   Sit to Sand  10 reps;without UE support   RTB around knees to decrease valgus      Balance Exercises - 06/12/18 1156      Balance Exercises: Standing   Tandem Stance  Eyes open   front foot on 4" step +UE elevation with 3# wt bar x10 each           PT Education - 06/12/18 1200    Education provided  Yes    Education Details  exercise technique, continue HEP    Person(s) Educated  Patient    Methods  Explanation;Demonstration    Comprehension  Verbalized understanding       PT Short Term Goals - 06/05/18 1244      PT SHORT TERM GOAL #1   Title  Patient and caregivers will be independent in initial HEP with routine performance.     Baseline  06/05/18: Patient reported trying to do exercises at home.     Time  3    Period  Weeks    Status  On-going      PT SHORT TERM GOAL #2   Title  Patient with tolerate the standing position with upper extremity support for 5 minutes before needing to rest to improve her ability to perform standing functional activities and ADLs within her home environment.    Baseline  06/05/18: Patient reported being able to stand for 5-6 minutes.     Time  3    Period  Weeks    Status  Achieved      PT SHORT TERM GOAL #3   Title  Patient will increase MMT grades by 1/2 grade in deficient musculature to show improvement in her strength and stability to perform functional activities.    Baseline  06/05/18: Patient demonstrated improvement in some, but not all muscle groups. See MMT.     Time  3    Period  Weeks    Status  On-going      PT SHORT TERM GOAL #4   Title  Patient will  be able to stand on either foot for 12 seconds to exhibit improved balance and decreased risk for falls.    Baseline  06/05/18: 8 seconds on the right, 4 seconds on the left    Time  3    Period  Weeks    Status  On-going        PT Long Term Goals - 06/05/18 1246      PT LONG TERM GOAL #1   Title  Patient will ambulate >300 feet with LRAD during the 2 MWT to improve her ability to ambulate further in community to allow to her go enjoy going out for a meal.    Baseline  06/05/18: Patient ambulated 228 feet during 2MWT with rolling walker this session.     Time  6    Period  Weeks    Status  On-going      PT LONG TERM GOAL #2   Title  Patient with tolerate the standing position with upper extremity support for 10 minutes before needing to rest to improve her ability to stand and have a conversation with a friend or relative.    Baseline  06/05/18: Patient reported ability to stand for 5-6 minutes.     Time  6    Period  Weeks    Status  On-going  PT LONG TERM GOAL #3   Title  Patient will increase MMT grades by 1 grade in deficent musculature to show continued improvement in her strength and stability to perform functional activities at home independently.    Baseline  06/05/18: Patient demonstrated improvement in some, but not all muscle groups. See MMT.     Time  6    Period  Weeks    Status  On-going      PT LONG TERM GOAL #4   Title  Patient will be able to stand on either foot for 16 seconds to exhibit improved balance and further decreased risk for falls.    Baseline  06/05/18: 8 seconds on the right, 4 seconds on the left    Time  6    Period  Weeks    Status  On-going      PT LONG TERM GOAL #5   Title  Pt will be able to perform 5xSTS in 12 sec or < with no UE in order to demo improved functional BLE strength and overall balance.    Baseline  06/05/18: Patient performed in 15.48 seconds.     Time  6    Period  Weeks    Status  On-going      PT LONG TERM GOAL #6    Title  Pt will report requiring assistance for ADLs and IADLs at home 25% of the time or < to demonstrate improved overall functional strength, endurance, and balance in order to promote independence at home.    Baseline  06/05/18: Patient reported requiring assistance 50% of the time.     Time  6    Period  Weeks    Status  On-going            Plan - 06/12/18 1201    Clinical Impression Statement  Pt with slightly improved tolerance to gait with SPC at beginning of therapy. Rest of session focused on functional strengthening and balance. Pt continues to require multiple seated rest breaks due to SOB but O2 and HR remained >90% and <120bpm. Added hip abd and hip diagonals with RTB as well as fwd/retro resisted walking at multigym this date to address gluteal strength deficits in order to maximize functional mobility and gait. Pt denying any pain at EOS, just muscle fatigue.     Rehab Potential  Good    PT Frequency  2x / week    PT Duration  6 weeks    PT Treatment/Interventions  ADLs/Self Care Home Management;Gait training;Stair training;Functional mobility training;Therapeutic activities;Patient/family education;Balance training;Neuromuscular re-education;Therapeutic exercise;Manual techniques;Energy conservation    PT Next Visit Plan  add tandem stance with front foot on dyna disc or bosu next; cotninue to progress closed chain exercises as able; continue balance activity. inquire if used her script for a rollator    PT Home Exercise Plan  9/3: wall squats    Consulted and Agree with Plan of Care  Patient       Patient will benefit from skilled therapeutic intervention in order to improve the following deficits and impairments:  Abnormal gait, Pain, Decreased mobility, Decreased coordination, Decreased activity tolerance, Decreased endurance, Decreased strength, Decreased balance, Difficulty walking  Visit Diagnosis: Muscle weakness (generalized)  Other abnormalities of gait and  mobility  Other symptoms and signs involving the musculoskeletal system  Difficulty in walking, not elsewhere classified     Problem List Patient Active Problem List   Diagnosis Date Noted  . Other constipation 05/28/2018  . Psoriatic arthritis (  [place]) 04/22/2018  . Genetic testing 04/07/2018  . Family history of colon cancer   . History of GI diverticular bleed 03/12/2018  . Lower GI bleed   . Diverticulosis of colon with hemorrhage   . Rectal bleeding 03/04/2018  . Acute GI bleeding 03/04/2018  . Thrombocytopenia ([place]) 01/03/2018  . Anemia, chronic disease 12/13/2017  . Mild protein-calorie malnutrition ([place]) 12/13/2017  . Peripheral neuropathy due to chemotherapy ([place]) 12/13/2017  . S/P thoracentesis   . Atrial fibrillation with RVR ([place]) 11/08/2017  . HCAP (healthcare-associated pneumonia) 11/08/2017  . Pleural effusion 09/20/2017  . Acute on chronic diastolic heart failure ([place]) 08/19/2017  . Pancytopenia, acquired ([place]) 08/15/2017  . Protein-calorie malnutrition, moderate ([place]) 07/30/2017  . Generalized weakness 07/30/2017  . Antineoplastic chemotherapy induced pancytopenia ([place]) 07/20/2017  . Left ovarian epithelial cancer ([place]) 07/18/2017  . PNA (pneumonia) 07/07/2017  . Ascites 06/30/2017  . Ascites, malignant 06/30/2017  . Elevated CA-125 01/18/2017  . Depression 12/23/2015  . Peripheral edema 08/23/2015  . Fracture of hip, left, closed ([place]) 12/08/2013  . Hip fracture ([place]) 12/08/2013  . PAD (peripheral artery disease) ([place]) 11/25/2013  . Encounter for therapeutic drug monitoring 11/09/2013  . Cough 03/20/2013  . Total knee replacement status 10/07/2012  . Knee pain 10/07/2012  . Knee stiffness 10/07/2012  . Tachycardia 09/17/2012  . Chest pain 09/17/2012  . Difficulty in walking(719.7) 09/16/2012  . Muscle weakness (generalized) 09/16/2012  . Postop Acute blood loss anemia 08/08/2012  . Instability of prosthetic knee ([place]) 08/06/2012  . Bloating  02/14/2012  . Upper abdominal pain 02/14/2012  . Allergic rhinitis, seasonal 02/15/2011  . COLITIS 02/20/2010  . Diarrhea 01/02/2010  . ABDOMINAL PAIN -GENERALIZED 01/02/2010  . PERSONAL HX COLONIC POLYPS 01/02/2010  . Hypothyroidism 12/28/2009  . COPD (chronic obstructive pulmonary disease) ([place]) 12/28/2009  . ARTHRITIS 12/28/2009       Geraldine Solar PT, DPT   [place] [address] [place], [place], [address] Phone: [phone]   Fax:  [phone]  Name: [name] MRN: [id number] Date of Birth: 1933/04/14

## 2018-06-16 ENCOUNTER — Other Ambulatory Visit: Payer: Self-pay | Admitting: Cardiothoracic Surgery

## 2018-06-16 ENCOUNTER — Ambulatory Visit (HOSPITAL_COMMUNITY): Payer: Medicare Other

## 2018-06-16 ENCOUNTER — Telehealth (HOSPITAL_COMMUNITY): Payer: Self-pay | Admitting: Internal Medicine

## 2018-06-16 DIAGNOSIS — J449 Chronic obstructive pulmonary disease, unspecified: Secondary | ICD-10-CM

## 2018-06-16 NOTE — Telephone Encounter (Signed)
06/16/18  daughter called to cx today's appt because she said that her mom's A-fib was acting up

## 2018-06-17 ENCOUNTER — Institutional Professional Consult (permissible substitution) (INDEPENDENT_AMBULATORY_CARE_PROVIDER_SITE_OTHER): Payer: Medicare Other | Admitting: Cardiothoracic Surgery

## 2018-06-17 ENCOUNTER — Ambulatory Visit
Admission: RE | Admit: 2018-06-17 | Discharge: 2018-06-17 | Disposition: A | Discharge status: Home / Self Care | Payer: Medicare Other | Source: Ambulatory Visit | Attending: Cardiothoracic Surgery | Admitting: Cardiothoracic Surgery

## 2018-06-17 ENCOUNTER — Encounter: Payer: Self-pay | Admitting: Cardiothoracic Surgery

## 2018-06-17 ENCOUNTER — Other Ambulatory Visit: Payer: Self-pay

## 2018-06-17 VITALS — BP 96/45 | HR 67 | Resp 16 | Ht 64.0 in | Wt 163.0 lb

## 2018-06-17 DIAGNOSIS — J90 Pleural effusion, not elsewhere classified: Secondary | ICD-10-CM | POA: Diagnosis not present

## 2018-06-17 DIAGNOSIS — Z9889 Other specified postprocedural states: Secondary | ICD-10-CM | POA: Diagnosis not present

## 2018-06-17 DIAGNOSIS — J449 Chronic obstructive pulmonary disease, unspecified: Secondary | ICD-10-CM

## 2018-06-17 NOTE — Progress Notes (Signed)
[address]       [place] [address]             [phone]                    Jizel S Frisina Wildwood Medical Record #[id number] Date of Birth: 10/06/1933  Referring: Collene Gobble, MD Primary Care: Asencion Noble, MD Primary Cardiologist: Rozann Lesches, MD  Chief Complaint:    Chief Complaint  Patient presents with  . Pleural Effusion    right...x 3 thoracentesis 6/10/05/08/18 with NEG CYTOLOGY...CT ABD 04/21/18.Marland KitchenMarland Kitchencxr today    History of Present Illness:    Lee-Anne Flicker Peri 84 y.o. female is seen in the office for evaluation for chronic right pleural effusion.  Patient has a history of COPD psoriatic arthritis previously on methotrexate chronic cough.  She has a history of ovarian cancer and had undergone treatment, she has a history of atrial fibrillation and hypertension.  She underwent thoracentesis on the right June 6 followed by the pulmonologist, cytology was negative for ovarian cancer.      Current Activity/ Functional Status:  Patient is independent with mobility/ambulation, transfers, ADL's, IADL's.   Zubrod Score: At the time of surgery this patient's most appropriate activity status/level should be described as: []     0    Normal activity, no symptoms []     1    Restricted in physical strenuous activity but ambulatory, able to do out light work []     2    Ambulatory and capable of self care, unable to do work activities, up and about               >50 % of waking hours                              []     3    Only limited self care, in bed greater than 50% of waking hours []     4    Completely disabled, no self care, confined to bed or chair []     5    Moribund   Past Medical History:  Diagnosis Date  . Anxiety   . Chronic blood loss anemia    03-04-2018 diverticular bleed and rectal bleeding,  transfused 2 units PRBCs 03-08-2018  . COPD (chronic obstructive pulmonary disease) ([place])    pulmologist-  dr byrum  . Depression   .  Diastolic CHF, chronic ([place])    followed by cardiology  . Diverticulosis   . Dyspnea on effort   . Family history of colon cancer   . Family hx of colon cancer   . Fibromyalgia   . Genetic testing 04/07/2018   MyRisk (35 genes) @ [facility] - No pathogenic mutations detected  . GERD (gastroesophageal reflux disease)    03-17-2018 per pt has not had any gerd issues in long time and no meds prn  . Hemorrhoids   . Hiatal hernia   . History of lower GI bleeding 03/04/2018   admission--  dx diverticular bleed and rectal bleed in setting of xarelto-- xarelto stopped and transfused 03-08-2018 2 units PRBCs  . History of rectal polyps   . History of shingles   . Hypothyroidism   . IBS (irritable bowel syndrome)   . Lymphocytic colitis    FOLLOWED BY DR Henrene Pastor  . Malignant ascites    dx at admission 09/ 2018 abdominal s/p  parencentesis 07-01-2017 2.5L,  07-08-2016  2.7L,  07-12-2017  1491m  . Neuropathy due to chemotherapeutic drug ([facility]   . Osteoporosis   . Ovarian cancer ([facility] oncologist-  [place]/  dr pGerarda Fraction  chemotherapy 07-19-2017 to 11-04-2017  . Paroxysmal atrial fibrillation ([facility]    CARDIOLOGIST-  DR S. MCDOWELL-  first dx'd 07/ 2014---was taking xarelto up until 03-07-2018 stopped due to lower GI bleed  . Pleural effusion    s/p  right thoracentesis, 02-2018 1.3L and 03-17-2018 right thoracentesis 6443m, post cxr no residual effusion  . Psoriatic arthritis ([facility]  . Schatzki's ring    S/P  DILATERAL 2013  . Seasonal allergic rhinitis     Past Surgical History:  Procedure Laterality Date  . CARDIOVASCULAR STRESS TEST  09/23/2012   Low risk lexiscan nuclear study w/ apical thinning but no evidence of ischemia/  normal LV function and wall motion , ef 75%  . CATARACT EXTRACTION W/ INTRAOCULAR LENS  IMPLANT, BILATERAL  10/2016  . COLONOSCOPY    . DEBULKING N/A 03/20/2018   Procedure: DEBULKING;  Surgeon: PhIsabel CapriceMD;  Location: [facility];  Service: Gynecology;   Laterality: N/A;  . EXAM UNDER ANESTHESIA WITH MANIPULATION OF KNEE Left 12-20-2003  dr waNoemi [place] post TKA  . FEMUR IM NAIL Left 12/11/2013   Procedure: INTRAMEDULLARY (IM) NAIL FEMORAL;  Surgeon: FrGearlean AlfMD;  Location: [facility];  Service: Orthopedics;  Laterality: Left;  . HYSTERECTOMY ABDOMINAL WITH SALPINGO-OOPHORECTOMY Bilateral 03/20/2018   Procedure: TOTAL HYSTERECTOMY ABDOMINAL WITH BILATERAL  SALPINGO-OOPHORECTOMY;  Surgeon: PhIsabel CapriceMD;  [facility]: [facility];  Service: Gynecology;  Laterality: Bilateral;  . IR FLUORO GUIDE PORT INSERTION RIGHT  07/22/2017  . IR PARACENTESIS  07/12/2017  . IR THORACENTESIS ASP PLEURAL SPACE W/IMG GUIDE  03/17/2018  . IR THORACENTESIS ASP PLEURAL SPACE W/IMG GUIDE  05/08/2018  . IR [place] VASC ACCESS RIGHT  07/22/2017  . KNEE ARTHROSCOPY W/ LATERAL RELEASE Left [facility]03-2005   [facility] w/  Lysis Adhesions,  excision loose body's  . LAPAROSCOPIC CHOLECYSTECTOMY  12-04-2010  dr zeigler  . LAPAROTOMY N/A 03/20/2018   Procedure: EXPLORATORY LAPAROTOMY;  Surgeon: PhIsabel CapriceMD;  [facility];  Service: Gynecology;  Laterality: N/A;  . OMENTECTOMY N/A 03/20/2018   Procedure: OMENTECTOMY;  Surgeon: PhIsabel CapriceMD;  [facility];  Service: Gynecology;  Laterality: N/A;  . TOTAL KNEE ARTHROPLASTY [facility]08-2003   [facility] TOTAL [facility]NEE REVISION  08/06/2012   Procedure: TOTAL KNEE REVISION;  Surgeon: FrGearlean AlfMD;  Location: [facility];  Service: Orthopedics;  Laterality: Left;  Left Total Knee Arthroplasty Revision  . TRANSTHORACIC ECHOCARDIOGRAM  08/20/2017   ef 60-65%,  grade 1 diastolic dysfunction/  trivial AR and TR  . Uterine polypectomy      Family History  Problem Relation Age of Onset  . Heart disease Father        pacemaker  . Lung cancer Father        smoker; deceased 9040. Colon cancer Mother 6527     2nd rectal ca at 7875. Colon cancer Maternal Grandfather        dx 7040sdeceased 7025s.  Depression Sister   . Leukemia Maternal Aunt        deceased 8220   Social History   Tobacco Use  Smoking Status Former Smoker  .  Packs/day: 1.00  . Years: 20.00  . Pack years: 20.00  . Types: Cigarettes  . Last attempt to quit: 10/08/1980  . Years since quitting: 37.7  Smokeless Tobacco Never Used    Social History   Substance and Sexual Activity  Alcohol Use Yes   Comment: rarely, 12-23-15 rarely     Allergies  Allergen Reactions  . Codeine Itching    Current Outpatient Medications  Medication Sig Dispense Refill  . albuterol (PROAIR HFA) 108 (90 Base) MCG/ACT inhaler Inhale 2 puffs into the lungs every 6 (six) hours as needed for wheezing or shortness of breath. 1 Inhaler 3  . ALPRAZolam (XANAX) 0.25 MG tablet Take 1 tablet (0.25 mg total) by mouth at bedtime as needed for anxiety. 5 tablet 0  . azelastine (ASTELIN) 0.1 % nasal spray Place 2 sprays into both nostrils 2 (two) times daily as needed for allergies.     . Cholecalciferol (VITAMIN D) 2000 units CAPS Take 1,000 Units by mouth daily.     Marland Kitchen dicyclomine (BENTYL) 10 MG capsule Take 1 tab by mouth every morning. May take twice daily as needed. 60 capsule 6  . diltiazem (CARDIZEM CD) 120 MG 24 hr capsule Take 1 capsule (120 mg total) by mouth every evening. 90 capsule 3  . diltiazem (CARDIZEM CD) 180 MG 24 hr capsule Take 1 capsule (180 mg total) by mouth every morning. 90 capsule 3  . diphenhydrAMINE (BENADRYL) 25 mg capsule Take 25 mg by mouth every 6 (six) hours as needed for itching.    . diphenoxylate-atropine (LOMOTIL) 2.5-0.025 MG tablet Take 1 tablet by mouth 4 (four) times daily. (Patient taking differently: Take 1 tablet by mouth 4 (four) times daily as needed for diarrhea or loose stools. ) 360 tablet 1  . DULoxetine (CYMBALTA) 30 MG capsule TAKE 1 CAPSULE(30 MG) BY MOUTH DAILY AT NIGHT 30 capsule 0  . fluticasone (FLONASE) 50 MCG/ACT nasal spray Place 2 sprays into both nostrils 2 (two) times daily as needed  (FOR NASAL CONGESTION.).     Marland Kitchen furosemide (LASIX) 20 MG tablet Take 1 tablet (20 mg total) by mouth daily. 90 tablet 9  . letrozole (FEMARA) 2.5 MG tablet Take 1 tablet (2.5 mg total) by mouth daily. 30 tablet 3  . levothyroxine (SYNTHROID, LEVOTHROID) 137 MCG tablet Take 137 mcg by mouth daily before breakfast. For thyroid therapy    . lidocaine-prilocaine (EMLA) cream Apply 1 application topically as needed. (Patient taking differently: Apply 1 application topically as needed (port). ) 30 g 6  . mirtazapine (REMERON) 15 MG tablet TAKE 1 TABLET(15 MG) BY MOUTH AT BEDTIME 30 tablet 11  . morphine (MS CONTIN) 15 MG 12 hr tablet Take 1 tablet (15 mg total) by mouth every 12 (twelve) hours. 60 tablet 0  . ondansetron (ZOFRAN) 8 MG tablet Take 8 mg by mouth every 8 (eight) hours as needed for nausea or vomiting.    . polyvinyl alcohol (LIQUIFILM TEARS) 1.4 % ophthalmic solution 1 drop 2 (two) times daily as needed for dry eyes.    . potassium chloride (K-DUR) 10 MEQ tablet Take 10 mEq by mouth daily.    . rivaroxaban (XARELTO) 20 MG TABS tablet Take 20 mg by mouth daily with supper.    . Tiotropium Bromide-Olodaterol (STIOLTO RESPIMAT) 2.5-2.5 MCG/ACT AERS Inhale 2 puffs into the lungs daily. 1 Inhaler 3   No current facility-administered medications for this visit.     Pertinent items are noted in HPI.   Review of  Systems:     Cardiac Review of Systems: [Y] = yes  or   [ N ] = no   Chest Pain [ n   ]  Resting SOB [ y  ] Exertional SOB  [ y ]  Orthopnea [ n ]   Pedal Edema [ n  ]    Palpitations [ y ] Syncope  [n  ]   Presyncope [n   ]   General Review of Systems: [Y] = yes [  ]=no Constitional: recent weight change [ n ];  Wt loss over the last 3 months [   ] anorexia [  ]; fatigue [  ]; nausea [  ]; night sweats [  ]; fever [  ]; or chills [  ];           Eye : blurred vision [  ]; diplopia [   ]; vision changes [  ];  Amaurosis fugax[  ]; Resp: cough [  ];  wheezing[y  ];  hemoptysis[  ];  shortness of breath[y ]; paroxysmal nocturnal dyspnea[  ]; dyspnea on exertion[y  ]; or orthopnea[  ];  GI:  gallstones[  ], vomiting[  ];  dysphagia[  ]; melena[  ];  hematochezia [  ]; heartburn[  ];   Hx of  Colonoscopy[  ]; GU: kidney stones [  ]; hematuria[  ];   dysuria [  ];  nocturia[  ];  history of     obstruction [  ]; urinary frequency [  ]             Skin: rash, swelling[  ];, hair loss[  ];  peripheral edema[  ];  or itching[  ]; Musculosketetal: myalgias[  ];  joint swelling[  ];  joint erythema[  ];  joint pain[  ];  back pain[  ];  Heme/Lymph: bruising[  ];  bleeding[  ];  anemia[  ];  Neuro: TIA[  ];  headaches[  ];  stroke[  ];  vertigo[  ];  seizures[  ];   paresthesias[  ];  difficulty walking[  ];  Psych:depression[  ]; anxiety[  ];  Endocrine: diabetes[  ];  thyroid dysfunction[  ];  Immunizations: Flu up to date [ y ]; Pneumococcal up to date Blue.Reese  ];  Other:    PHYSICAL EXAMINATION: BP (!) 96/45 (BP Location: Left Arm, Patient Position: Sitting, Cuff Size: Large)   Pulse 67   Resp 16   Ht 5' 4"  (1.626 m)   Wt 163 lb (73.9 kg)   SpO2 94% Comment: ON RA  BMI 27.98 kg/m  General appearance: alert, cooperative and no distress Head: Normocephalic, without obvious abnormality, atraumatic Neck: no adenopathy, no carotid bruit, no JVD, supple, symmetrical, trachea midline and thyroid not enlarged, symmetric, no tenderness/mass/nodules Lymph nodes: Cervical, supraclavicular, and axillary nodes normal. Resp: diminished breath sounds RLL Back: symmetric, no curvature. ROM normal. No CVA tenderness. Cardio: regular rate and rhythm, S1, S2 normal, no murmur, click, rub or gallop GI: soft, non-tender; bowel sounds normal; no masses,  no organomegaly Extremities: extremities normal, atraumatic, no cyanosis or edema and Homans sign is negative, no sign of DVT Neurologic: Grossly normal  Diagnostic Studies & Laboratory data:     Recent Radiology Findings:   Dg Chest 2  View  Result Date: 06/17/2018 CLINICAL DATA:  History of ovarian carcinoma.  Pleural effusion. EXAM: CHEST - 2 VIEW COMPARISON:  June 03, 2018 FINDINGS: Port-A-Cath tip is in the superior vena  cava. No pneumothorax. There is a fairly small right pleural effusion with right base atelectasis. Lungs elsewhere are clear. Heart size and pulmonary vascularity are normal. No adenopathy. There is aortic atherosclerosis. There is degenerative change in the thoracic spine. No blastic or lytic bone lesions are evident. IMPRESSION: Small right pleural effusion with right base atelectasis. No edema or consolidation. No adenopathy evident. There is aortic atherosclerosis. Aortic Atherosclerosis (ICD10-I70.0). Electronically Signed   By: Lowella Grip III M.D.   On: 06/17/2018 15:53   Dg Chest 2 View  Result Date: 06/03/2018 CLINICAL DATA:  History of right pleural effusion, shortness of breath, cough EXAM: CHEST - 2 VIEW COMPARISON:  Portable chest x-ray of 05/08/2018 FINDINGS: The lungs are slightly hyperaerated. There do appear to be small pleural effusions present with mild volume loss at the lung bases. No pneumonia is seen. Mediastinal and hilar contours are unchanged and the heart is within normal limits in size. There are degenerative changes in the mid to lower thoracic spine. Port-A-Cath is present with the tip overlying the lower SVC. IMPRESSION: 1. Suspect small pleural effusions with mild basilar atelectasis. 2. Hyperaeration.  No pneumonia. Electronically Signed   By: Ivar Drape M.D.   On: 06/03/2018 17:06     I have independently reviewed the above radiology studies  and reviewed the findings with the patient.   Recent Lab Findings: Lab Results  Component Value Date   WBC 8.2 05/28/2018   HGB 11.9 05/28/2018   HCT 35.7 05/28/2018   PLT 197 05/28/2018   GLUCOSE 98 05/28/2018   CHOL 147 11/14/2017   ALT 9 05/28/2018   AST 13 (L) 05/28/2018   NA 137 05/28/2018   K 4.0 05/28/2018   CL 98  05/28/2018   CREATININE 1.04 (H) 05/28/2018   BUN 15 05/28/2018   CO2 30 05/28/2018   TSH 0.388 11/10/2017   INR 0.90 07/22/2017   HGBA1C 5.7 (H) 06/12/2013      Assessment / Plan:   Patient with symptomatic recurrent right pleural effusion.  At the request of pulmonary have seen the patient and discussed with her various options including right VATS versus basement of Pleurx catheter.  All previous cytologies have been negative.  She was willing to consider placement of a right Pleurx catheter for drainage.  She noted that previously each time the fluid was drained she did become symptomatically better.  She was not interested in any more invasive procedure.  We will plan to proceed with placement of right Pleurx catheter September 17.      I  Spent 45  minutes with  the patient face to face and greater then 50% of the time was spent in counseling and coordination of care.    Grace Isaac MD      [address] [place] [address] Office [phone]   [place]  06/17/2018 4:21 PM

## 2018-06-18 ENCOUNTER — Other Ambulatory Visit: Payer: Self-pay | Admitting: *Deleted

## 2018-06-18 DIAGNOSIS — J90 Pleural effusion, not elsewhere classified: Secondary | ICD-10-CM

## 2018-06-19 ENCOUNTER — Encounter (HOSPITAL_COMMUNITY): Payer: Self-pay

## 2018-06-19 ENCOUNTER — Ambulatory Visit (HOSPITAL_COMMUNITY): Payer: Medicare Other

## 2018-06-19 DIAGNOSIS — M6281 Muscle weakness (generalized): Secondary | ICD-10-CM

## 2018-06-19 DIAGNOSIS — R2689 Other abnormalities of gait and mobility: Secondary | ICD-10-CM

## 2018-06-19 DIAGNOSIS — R29898 Other symptoms and signs involving the musculoskeletal system: Secondary | ICD-10-CM

## 2018-06-19 DIAGNOSIS — R262 Difficulty in walking, not elsewhere classified: Secondary | ICD-10-CM

## 2018-06-19 NOTE — Therapy (Signed)
[place], [place], [address] Phone: [phone]   Fax:  [phone]  Physical Therapy Treatment  Patient Details  Name: [name] MRN: [id number] Date of Birth: Jun 17, 1933 Referring Provider: Dr. Heath Lark   Encounter Date: 06/19/2018  PT End of Session - 06/19/18 1118    Visit Number  9    Number of Visits  12    Date for PT Re-Evaluation  06/25/18   mini-re-assess 06/04/2018   Authorization Type  Medicare; secondary - Generic Commercial    Authorization Time Period  05/14/18-06/25/2018    Authorization - Visit Number  3    Authorization - Number of Visits  10    PT Start Time  1120    PT Stop Time  1202    PT Time Calculation (min)  42 min    Equipment Utilized During Treatment  Gait belt    Activity Tolerance  Patient tolerated treatment well    Behavior During Therapy  [facility] for tasks assessed/performed       Past Medical History:  Diagnosis Date  . Anxiety   . Chronic blood loss anemia    03-04-2018 diverticular bleed and rectal bleeding,  transfused 2 units PRBCs 03-08-2018  . COPD (chronic obstructive pulmonary disease) ([place])    pulmologist-  dr byrum  . Depression   . Diastolic CHF, chronic ([place])    followed by cardiology  . Diverticulosis   . Dyspnea on effort   . Family history of colon cancer   . Family hx of colon cancer   . Fibromyalgia   . Genetic testing 04/07/2018   MyRisk (35 genes) @ [facility] - No pathogenic mutations detected  . GERD (gastroesophageal reflux disease)    03-17-2018 per pt has not had any gerd issues in long time and no meds prn  . Hemorrhoids   . Hiatal hernia   . History of lower GI bleeding 03/04/2018   admission--  dx diverticular bleed and rectal bleed in setting of xarelto-- xarelto stopped and transfused 03-08-2018 2 units PRBCs  . History of rectal polyps   . History of shingles   . Hypothyroidism   . IBS (irritable bowel syndrome)   . Lymphocytic  colitis    FOLLOWED BY DR Henrene Pastor  . Malignant ascites    dx at admission 09/ 2018 abdominal s/p  parencentesis 07-01-2017 2.5L,  07-08-2016  2.7L,  07-12-2017  1451m  . Neuropathy due to chemotherapeutic drug ([facility]   . Osteoporosis   . Ovarian cancer ([facility] oncologist-  gors[place]/  dr pGerarda Fraction  chemotherapy 07-19-2017 to 11-04-2017  . Paroxysmal atrial fibrillation ([place]    CARDIOLOGIST-  DR S. MCDOWELL-  first dx'd 07/ 2014---was taking xarelto up until 03-07-2018 stopped due to lower GI bleed  . Pleural effusion    s/p  right thoracentesis, 02-2018 1.3L and 03-17-2018 right thoracentesis 6459m, post cxr no residual effusion  . Psoriatic arthritis ([place]  . Schatzki's ring    S/P  DILATERAL 2013  . Seasonal allergic rhinitis     Past Surgical History:  Procedure Laterality Date  . CARDIOVASCULAR STRESS TEST  09/23/2012   Low risk lexiscan nuclear study w/ apical thinning but no evidence of ischemia/  normal LV function and wall motion , ef 75%  . CATARACT EXTRACTION W/ INTRAOCULAR LENS  IMPLANT, BILATERAL  10/2016  . COLONOSCOPY    . DEBULKING N/A 03/20/2018   Procedure: DEBULKING;  Surgeon: PhIsabel Caprice  MD;  Location: [facility];  Service: Gynecology;  Laterality: N/A;  . EXAM UNDER ANESTHESIA WITH MANIPULATION OF KNEE Left 12-20-2003  dr Noemi Chapel   post TKA  . FEMUR IM NAIL Left 12/11/2013   Procedure: INTRAMEDULLARY (IM) NAIL FEMORAL;  Surgeon: [name], MD;  Location: [facility];  Service: Orthopedics;  Laterality: Left;  . HYSTERECTOMY ABDOMINAL WITH SALPINGO-OOPHORECTOMY Bilateral 03/20/2018   Procedure: TOTAL HYSTERECTOMY ABDOMINAL WITH BILATERAL  SALPINGO-OOPHORECTOMY;  Surgeon: Isabel Caprice, MD;  Location: [facility];  Service: Gynecology;  Laterality: Bilateral;  . IR FLUORO GUIDE PORT INSERTION RIGHT  07/22/2017  . IR PARACENTESIS  07/12/2017  . IR THORACENTESIS ASP PLEURAL SPACE W/IMG GUIDE  03/17/2018  . IR THORACENTESIS ASP PLEURAL SPACE W/IMG GUIDE  05/08/2018  . IR US  GUIDE VASC ACCESS RIGHT  07/22/2017  . KNEE ARTHROSCOPY W/ LATERAL RELEASE Left 09-03-2005   dr Noemi Chapel  [facility]   w/  Lysis Adhesions,  excision loose body's  . LAPAROSCOPIC CHOLECYSTECTOMY  12-04-2010  dr zeigler  . LAPAROTOMY N/A 03/20/2018   Procedure: EXPLORATORY LAPAROTOMY;  Surgeon: Isabel Caprice, MD;  [facility]: [facility];  Service: Gynecology;  Laterality: N/A;  . OMENTECTOMY N/A 03/20/2018   Procedure: OMENTECTOMY;  Surgeon: Isabel Caprice, MD;  Location: [facility];  Service: Gynecology;  Laterality: N/A;  . TOTAL KNEE ARTHROPLASTY Left 09-08-2003   dr Noemi Chapel  [facility]  . TOTAL KNEE REVISION  08/06/2012   Procedure: TOTAL KNEE REVISION;  Surgeon: [name], MD;  Location: [facility];  Service: Orthopedics;  Laterality: Left;  Left Total Knee Arthroplasty Revision  . TRANSTHORACIC ECHOCARDIOGRAM  08/20/2017   ef 60-65%,  grade 1 diastolic dysfunction/  trivial AR and TR  . Uterine polypectomy      There were no vitals filed for this visit.  Subjective Assessment - 06/19/18 1118    Subjective  Pt states that her Afib was acting up on her the other day which is why she cancelled her last appointment. She states that her heart rate was "flipping all over the place" and her HR was in the 150-160s. It was down to 65bpm yesterday but back into the 70s before she went to sleep.    How long can you sit comfortably?  not limited    How long can you stand comfortably?  5-6 minutes    How long can you walk comfortably?  5 minutes with RW    Currently in Pain?  Yes    Pain Score  3     Pain Location  Generalized    Pain Descriptors / Indicators  Aching    Pain Type  Chronic pain    Pain Onset  More than a month ago    Pain Frequency  Constant    Aggravating Factors   not sure but thinks the hormonal therapy that she is taking is aggravating it    Pain Relieving Factors  rest    Effect of Pain on Daily Activities  limits             [facility] Adult PT Treatment/Exercise - 06/19/18 0001       Exercises   Exercises  Knee/Hip      Knee/Hip Exercises: Standing   Lateral Step Up  Both;10 reps;Step Height: 4";Hand Hold: 1    Forward Step Up  Both;10 reps;Step Height: 4";Hand Hold: 0    Gait Training  gait with SPC x178f, 1 standing rest break      Balance Exercises -  06/19/18 1145      Balance Exercises: Standing   Tandem Stance  Eyes open;Foam/compliant surface;Intermittent upper extremity support   3x10" holds each; +R/L head turns x10 reps each   Tandem Gait  Forward;Retro;Intermittent upper extremity support;2 reps   in // bars           PT Short Term Goals - 06/05/18 1244      PT SHORT TERM GOAL #1   Title  Patient and caregivers will be independent in initial HEP with routine performance.     Baseline  06/05/18: Patient reported trying to do exercises at home.     Time  3    Period  Weeks    Status  On-going      PT SHORT TERM GOAL #2   Title  Patient with tolerate the standing position with upper extremity support for 5 minutes before needing to rest to improve her ability to perform standing functional activities and ADLs within her home environment.    Baseline  06/05/18: Patient reported being able to stand for 5-6 minutes.     Time  3    Period  Weeks    Status  Achieved      PT SHORT TERM GOAL #3   Title  Patient will increase MMT grades by 1/2 grade in deficient musculature to show improvement in her strength and stability to perform functional activities.    Baseline  06/05/18: Patient demonstrated improvement in some, but not all muscle groups. See MMT.     Time  3    Period  Weeks    Status  On-going      PT SHORT TERM GOAL #4   Title  Patient will be able to stand on either foot for 12 seconds to exhibit improved balance and decreased risk for falls.    Baseline  06/05/18: 8 seconds on the right, 4 seconds on the left    Time  3    Period  Weeks    Status  On-going        PT Long Term Goals - 06/05/18 1246      PT LONG TERM GOAL #1    Title  Patient will ambulate >300 feet with LRAD during the 2 MWT to improve her ability to ambulate further in community to allow to her go enjoy going out for a meal.    Baseline  06/05/18: Patient ambulated 228 feet during 2MWT with rolling walker this session.     Time  6    Period  Weeks    Status  On-going      PT LONG TERM GOAL #2   Title  Patient with tolerate the standing position with upper extremity support for 10 minutes before needing to rest to improve her ability to stand and have a conversation with a friend or relative.    Baseline  06/05/18: Patient reported ability to stand for 5-6 minutes.     Time  6    Period  Weeks    Status  On-going      PT LONG TERM GOAL #3   Title  Patient will increase MMT grades by 1 grade in deficent musculature to show continued improvement in her strength and stability to perform functional activities at home independently.    Baseline  06/05/18: Patient demonstrated improvement in some, but not all muscle groups. See MMT.     Time  6    Period  Weeks    Status  On-going  PT LONG TERM GOAL #4   Title  Patient will be able to stand on either foot for 16 seconds to exhibit improved balance and further decreased risk for falls.    Baseline  06/05/18: 8 seconds on the right, 4 seconds on the left    Time  6    Period  Weeks    Status  On-going      PT LONG TERM GOAL #5   Title  Pt will be able to perform 5xSTS in 12 sec or < with no UE in order to demo improved functional BLE strength and overall balance.    Baseline  06/05/18: Patient performed in 15.48 seconds.     Time  6    Period  Weeks    Status  On-going      PT LONG TERM GOAL #6   Title  Pt will report requiring assistance for ADLs and IADLs at home 25% of the time or < to demonstrate improved overall functional strength, endurance, and balance in order to promote independence at home.    Baseline  06/05/18: Patient reported requiring assistance 50% of the time.     Time  6     Period  Weeks    Status  On-going            Plan - 06/19/18 1205    Clinical Impression Statement  Pt presenting to therapy stating that she cancelled her last appointment due to her afib being "all over the place." She states that by later that evening, it has corrected itself and her HR was back to its normal resting heart rate. At beginning of session, her HR was 92bpm and O2 high 90s without any subjective symptoms. Pt's main limitation today was her SOB and O2 sats as it consistently dropped well below 90% after just about every activity. The lowest it read was 78%, however, after each drop in O2, it would recover into mid-90s after about a 2 min seated rest break. She continues to be challenged with balance activities as illustrated by unsteadiness with tandem stance/gait. Pt set to have outpatient surgery on Tuesday 06/24/18 to place a tube in her R lung to assist with draining the fluid from her lungs. She will call the surgeon's office today to check and see if she will be appropriate to come to therapy following that and let our office know.    Rehab Potential  Good    PT Frequency  2x / week    PT Duration  6 weeks    PT Treatment/Interventions  ADLs/Self Care Home Management;Gait training;Stair training;Functional mobility training;Therapeutic activities;Patient/family education;Balance training;Neuromuscular re-education;Therapeutic exercise;Manual techniques;Energy conservation    PT Next Visit Plan  add tandem stance with front foot on dyna disc or bosu next; cotninue to progress closed chain exercises as able; continue balance activity. inquire if used her script for a rollator    PT Home Exercise Plan  9/3: wall squats    Consulted and Agree with Plan of Care  Patient       Patient will benefit from skilled therapeutic intervention in order to improve the following deficits and impairments:  Abnormal gait, Pain, Decreased mobility, Decreased coordination, Decreased activity  tolerance, Decreased endurance, Decreased strength, Decreased balance, Difficulty walking  Visit Diagnosis: Muscle weakness (generalized)  Other abnormalities of gait and mobility  Other symptoms and signs involving the musculoskeletal system  Difficulty in walking, not elsewhere classified     Problem List Patient Active Problem List  Diagnosis Date Noted  . Other constipation 05/28/2018  . Psoriatic arthritis ([place]) 04/22/2018  . Genetic testing 04/07/2018  . Family history of colon cancer   . History of GI diverticular bleed 03/12/2018  . Lower GI bleed   . Diverticulosis of colon with hemorrhage   . Rectal bleeding 03/04/2018  . Acute GI bleeding 03/04/2018  . Thrombocytopenia ([place]) 01/03/2018  . Anemia, chronic disease 12/13/2017  . Mild protein-calorie malnutrition ([place]) 12/13/2017  . Peripheral neuropathy due to chemotherapy ([place]) 12/13/2017  . S/P thoracentesis   . Atrial fibrillation with RVR ([place]) 11/08/2017  . HCAP (healthcare-associated pneumonia) 11/08/2017  . Pleural effusion 09/20/2017  . Acute on chronic diastolic heart failure ([place]) 08/19/2017  . Pancytopenia, acquired ([place]) 08/15/2017  . Protein-calorie malnutrition, moderate ([place]) 07/30/2017  . Generalized weakness 07/30/2017  . Antineoplastic chemotherapy induced pancytopenia ([place]) 07/20/2017  . Left ovarian epithelial cancer ([place]) 07/18/2017  . PNA (pneumonia) 07/07/2017  . Ascites 06/30/2017  . Ascites, malignant 06/30/2017  . Elevated CA-125 01/18/2017  . Depression 12/23/2015  . Peripheral edema 08/23/2015  . Fracture of hip, left, closed ([place]) 12/08/2013  . Hip fracture ([place]) 12/08/2013  . PAD (peripheral artery disease) ([place]) 11/25/2013  . Encounter for therapeutic drug monitoring 11/09/2013  . Cough 03/20/2013  . Total knee replacement status 10/07/2012  . Knee pain 10/07/2012  . Knee stiffness 10/07/2012  . Tachycardia 09/17/2012  . Chest pain 09/17/2012  . Difficulty in  walking(719.7) 09/16/2012  . Muscle weakness (generalized) 09/16/2012  . Postop Acute blood loss anemia 08/08/2012  . Instability of prosthetic knee ([place]) 08/06/2012  . Bloating 02/14/2012  . Upper abdominal pain 02/14/2012  . Allergic rhinitis, seasonal 02/15/2011  . COLITIS 02/20/2010  . Diarrhea 01/02/2010  . ABDOMINAL PAIN -GENERALIZED 01/02/2010  . PERSONAL HX COLONIC POLYPS 01/02/2010  . Hypothyroidism 12/28/2009  . COPD (chronic obstructive pulmonary disease) ([place]) 12/28/2009  . ARTHRITIS 12/28/2009        Geraldine Solar PT, DPT  [place] [address] [place], [place], [address] Phone: [phone]   Fax:  [phone]  Name: KEELA RUBERT MRN: [id number] Date of Birth: 20-Apr-1933

## 2018-06-20 ENCOUNTER — Encounter

## 2018-06-23 ENCOUNTER — Ambulatory Visit (HOSPITAL_COMMUNITY): Payer: Medicare Other | Admitting: Physical Therapy

## 2018-06-23 ENCOUNTER — Telehealth (HOSPITAL_COMMUNITY): Payer: Self-pay | Admitting: Internal Medicine

## 2018-06-23 ENCOUNTER — Encounter (HOSPITAL_COMMUNITY): Payer: Self-pay

## 2018-06-23 MED ORDER — CEFAZOLIN SODIUM-DEXTROSE 2-4 GM/100ML-% IV SOLN
2.0000 g | INTRAVENOUS | Status: AC
  Administered 2018-06-24: 2 g via INTRAVENOUS
  Filled 2018-06-23: qty 100

## 2018-06-23 NOTE — Telephone Encounter (Signed)
06/23/18  daughter called to cx today said she was just to short of breath and will let us know about the Thursday appointment

## 2018-06-23 NOTE — Anesthesia Preprocedure Evaluation (Addendum)
Anesthesia Evaluation  Patient identified by MRN, date of birth, ID band Patient awake    Reviewed: Allergy & Precautions, NPO status , Patient's Chart, lab work & pertinent test results  Airway Mallampati: II  TM Distance: >3 FB Neck ROM: Full    Dental  (+) Dental Advisory Given, Partial Lower   Pulmonary COPD, former smoker,  Pleural effusion  s/p  right thoracentesis, 02-2018 and 03-17-2018   Pulmonary exam normal breath sounds clear to auscultation       Cardiovascular hypertension, Pt. on medications + Peripheral Vascular Disease and +CHF  Normal cardiovascular exam+ dysrhythmias (PAF) Atrial Fibrillation  Rhythm:Regular Rate:Normal  Echo 08/20/17 Normal LV size with EF 60-65%. Normal RV size and systolic function. No significant valvular abnormalities.   Neuro/Psych PSYCHIATRIC DISORDERS Anxiety Depression  Neuromuscular disease    GI/Hepatic Neg liver ROS, hiatal hernia, GERD  ,  Endo/Other  Hypothyroidism   Renal/GU negative Renal ROS     Musculoskeletal  (+) Arthritis , Fibromyalgia -  Abdominal   Peds  Hematology  (+) Blood dyscrasia (Xarelto), anemia ,   Anesthesia Other Findings Day of surgery medications reviewed with the patient.  Reproductive/Obstetrics Ovarian cancer with malignant ascites                               Lab Results  Component Value Date   WBC 8.2 05/28/2018   HGB 11.9 05/28/2018   HCT 35.7 05/28/2018   MCV 91.9 05/28/2018   PLT 197 05/28/2018   Lab Results  Component Value Date   CREATININE 1.04 (H) 05/28/2018   BUN 15 05/28/2018   NA 137 05/28/2018   K 4.0 05/28/2018   CL 98 05/28/2018   CO2 30 05/28/2018    Anesthesia Physical  Anesthesia Plan  ASA: IV  Anesthesia Plan: MAC   Post-op Pain Management:    Induction: Intravenous  PONV Risk Score and Plan: 2 and Ondansetron, Treatment may vary due to age or medical condition and  Propofol infusion  Airway Management Planned: Natural Airway and Simple Face Mask  Additional Equipment:   Intra-op Plan:   Post-operative Plan:   Informed Consent: I have reviewed the patients History and Physical, chart, labs and discussed the procedure including the risks, benefits and alternatives for the proposed anesthesia with the patient or authorized representative who has indicated his/her understanding and acceptance.   Dental advisory given  Plan Discussed with: CRNA, Anesthesiologist and Surgeon  Anesthesia Plan Comments:        Anesthesia Quick Evaluation

## 2018-06-24 ENCOUNTER — Encounter (HOSPITAL_COMMUNITY)
Admission: RE | Disposition: A | Discharge status: Home / Self Care | Payer: Self-pay | Source: Ambulatory Visit | Attending: Cardiothoracic Surgery

## 2018-06-24 ENCOUNTER — Ambulatory Visit (HOSPITAL_COMMUNITY): Payer: Medicare Other

## 2018-06-24 ENCOUNTER — Ambulatory Visit (HOSPITAL_COMMUNITY): Payer: Medicare Other | Admitting: Certified Registered Nurse Anesthetist

## 2018-06-24 ENCOUNTER — Ambulatory Visit (HOSPITAL_COMMUNITY)
Admission: RE | Admit: 2018-06-24 | Discharge: 2018-06-24 | Disposition: A | Discharge status: Home / Self Care | Payer: Medicare Other | Source: Ambulatory Visit | Attending: Cardiothoracic Surgery | Admitting: Cardiothoracic Surgery

## 2018-06-24 ENCOUNTER — Encounter (HOSPITAL_COMMUNITY): Payer: Self-pay | Admitting: Certified Registered Nurse Anesthetist

## 2018-06-24 ENCOUNTER — Other Ambulatory Visit: Payer: Self-pay

## 2018-06-24 DIAGNOSIS — F419 Anxiety disorder, unspecified: Secondary | ICD-10-CM | POA: Insufficient documentation

## 2018-06-24 DIAGNOSIS — Z8543 Personal history of malignant neoplasm of ovary: Secondary | ICD-10-CM | POA: Insufficient documentation

## 2018-06-24 DIAGNOSIS — Z79899 Other long term (current) drug therapy: Secondary | ICD-10-CM | POA: Insufficient documentation

## 2018-06-24 DIAGNOSIS — Z09 Encounter for follow-up examination after completed treatment for conditions other than malignant neoplasm: Secondary | ICD-10-CM

## 2018-06-24 DIAGNOSIS — J449 Chronic obstructive pulmonary disease, unspecified: Secondary | ICD-10-CM | POA: Insufficient documentation

## 2018-06-24 DIAGNOSIS — I739 Peripheral vascular disease, unspecified: Secondary | ICD-10-CM | POA: Insufficient documentation

## 2018-06-24 DIAGNOSIS — Z79811 Long term (current) use of aromatase inhibitors: Secondary | ICD-10-CM | POA: Insufficient documentation

## 2018-06-24 DIAGNOSIS — E039 Hypothyroidism, unspecified: Secondary | ICD-10-CM | POA: Diagnosis not present

## 2018-06-24 DIAGNOSIS — I110 Hypertensive heart disease with heart failure: Secondary | ICD-10-CM | POA: Insufficient documentation

## 2018-06-24 DIAGNOSIS — J90 Pleural effusion, not elsewhere classified: Secondary | ICD-10-CM | POA: Insufficient documentation

## 2018-06-24 DIAGNOSIS — F17210 Nicotine dependence, cigarettes, uncomplicated: Secondary | ICD-10-CM | POA: Diagnosis not present

## 2018-06-24 DIAGNOSIS — Z96652 Presence of left artificial knee joint: Secondary | ICD-10-CM | POA: Diagnosis not present

## 2018-06-24 DIAGNOSIS — L4050 Arthropathic psoriasis, unspecified: Secondary | ICD-10-CM | POA: Insufficient documentation

## 2018-06-24 DIAGNOSIS — Z01818 Encounter for other preprocedural examination: Secondary | ICD-10-CM

## 2018-06-24 DIAGNOSIS — Z9221 Personal history of antineoplastic chemotherapy: Secondary | ICD-10-CM | POA: Insufficient documentation

## 2018-06-24 DIAGNOSIS — I5032 Chronic diastolic (congestive) heart failure: Secondary | ICD-10-CM | POA: Insufficient documentation

## 2018-06-24 DIAGNOSIS — Z79890 Hormone replacement therapy (postmenopausal): Secondary | ICD-10-CM | POA: Insufficient documentation

## 2018-06-24 DIAGNOSIS — F329 Major depressive disorder, single episode, unspecified: Secondary | ICD-10-CM | POA: Diagnosis not present

## 2018-06-24 HISTORY — PX: CHEST TUBE INSERTION: SHX231

## 2018-06-24 HISTORY — PX: OTHER SURGICAL HISTORY: SHX169

## 2018-06-24 HISTORY — DX: Noninfective gastroenteritis and colitis, unspecified: K52.9

## 2018-06-24 LAB — COMPREHENSIVE METABOLIC PANEL
ALT: 10 U/L (ref 0–44)
AST: 16 U/L (ref 15–41)
Albumin: 3.3 g/dL — ABNORMAL LOW (ref 3.5–5.0)
Alkaline Phosphatase: 52 U/L (ref 38–126)
Anion gap: 9 (ref 5–15)
BUN: 13 mg/dL (ref 8–23)
CO2: 26 mmol/L (ref 22–32)
Calcium: 8.5 mg/dL — ABNORMAL LOW (ref 8.9–10.3)
Chloride: 103 mmol/L (ref 98–111)
Creatinine, Ser: 1.08 mg/dL — ABNORMAL HIGH (ref 0.44–1.00)
GFR calc Af Amer: 53 mL/min — ABNORMAL LOW (ref >60)
GFR calc non Af Amer: 46 mL/min — ABNORMAL LOW (ref >60)
Glucose, Bld: 107 mg/dL — ABNORMAL HIGH (ref 70–99)
Potassium: 3.8 mmol/L (ref 3.5–5.1)
Sodium: 138 mmol/L (ref 135–145)
Total Bilirubin: 0.6 mg/dL (ref 0.3–1.2)
Total Protein: 7 g/dL (ref 6.5–8.1)

## 2018-06-24 LAB — PROTIME-INR
INR: 1.09
Prothrombin Time: 14.1 seconds (ref 11.4–15.2)

## 2018-06-24 LAB — CBC
HCT: 36.9 % (ref 36.0–46.0)
Hemoglobin: 11.7 g/dL — ABNORMAL LOW (ref 12.0–15.0)
MCH: 29.4 pg (ref 26.0–34.0)
MCHC: 31.7 g/dL (ref 30.0–36.0)
MCV: 92.7 fL (ref 78.0–100.0)
Platelets: 206 10*3/uL (ref 150–400)
RBC: 3.98 MIL/uL (ref 3.87–5.11)
RDW: 11.9 % (ref 11.5–15.5)
WBC: 7.7 10*3/uL (ref 4.0–10.5)

## 2018-06-24 LAB — SURGICAL PCR SCREEN
MRSA, PCR: NEGATIVE
Staphylococcus aureus: NEGATIVE

## 2018-06-24 LAB — APTT: aPTT: 32 seconds (ref 24–36)

## 2018-06-24 SURGERY — INSERTION, PLEURAL DRAINAGE CATHETER
Anesthesia: Monitor Anesthesia Care | Laterality: Right

## 2018-06-24 MED ORDER — OXYCODONE HCL 5 MG PO TABS
ORAL_TABLET | ORAL | Status: AC
  Filled 2018-06-24: qty 1

## 2018-06-24 MED ORDER — PHENYLEPHRINE 40 MCG/ML (10ML) SYRINGE FOR IV PUSH (FOR BLOOD PRESSURE SUPPORT)
PREFILLED_SYRINGE | INTRAVENOUS | Status: AC
  Filled 2018-06-24: qty 10

## 2018-06-24 MED ORDER — OXYCODONE HCL 5 MG/5ML PO SOLN: 5.0000 mg | Freq: Once | ORAL | Status: AC | PRN

## 2018-06-24 MED ORDER — 0.9 % SODIUM CHLORIDE (POUR BTL) OPTIME
TOPICAL | Status: DC | PRN
  Administered 2018-06-24: 1000 mL

## 2018-06-24 MED ORDER — PROPOFOL 10 MG/ML IV BOLUS
INTRAVENOUS | Status: DC | PRN
  Administered 2018-06-24: 20 mg via INTRAVENOUS

## 2018-06-24 MED ORDER — OXYCODONE HCL 5 MG PO TABS
5.0000 mg | ORAL_TABLET | Freq: Once | ORAL | Status: AC | PRN
  Administered 2018-06-24: 5 mg via ORAL

## 2018-06-24 MED ORDER — FENTANYL CITRATE (PF) 100 MCG/2ML IJ SOLN
25.0000 ug | INTRAMUSCULAR | Status: DC | PRN
  Administered 2018-06-24: 50 ug via INTRAVENOUS
  Administered 2018-06-24: 25 ug via INTRAVENOUS

## 2018-06-24 MED ORDER — LIDOCAINE 1 % OPTIME INJ - NO CHARGE
INTRAMUSCULAR | Status: DC | PRN
  Administered 2018-06-24: 8 mL

## 2018-06-24 MED ORDER — LACTATED RINGERS IV SOLN
INTRAVENOUS | Status: DC | PRN
  Administered 2018-06-24: 07:00:00 via INTRAVENOUS

## 2018-06-24 MED ORDER — ONDANSETRON HCL 4 MG/2ML IJ SOLN
INTRAMUSCULAR | Status: DC | PRN
  Administered 2018-06-24: 4 mg via INTRAVENOUS

## 2018-06-24 MED ORDER — FENTANYL CITRATE (PF) 100 MCG/2ML IJ SOLN
INTRAMUSCULAR | Status: DC | PRN
  Administered 2018-06-24: 50 ug via INTRAVENOUS

## 2018-06-24 MED ORDER — PROPOFOL 10 MG/ML IV BOLUS
INTRAVENOUS | Status: AC
  Filled 2018-06-24: qty 20

## 2018-06-24 MED ORDER — FENTANYL CITRATE (PF) 100 MCG/2ML IJ SOLN
INTRAMUSCULAR | Status: AC
  Filled 2018-06-24: qty 2

## 2018-06-24 MED ORDER — MUPIROCIN 2 % EX OINT
1.0000 "application " | TOPICAL_OINTMENT | Freq: Once | CUTANEOUS | Status: AC
  Administered 2018-06-24: 1 via TOPICAL
  Filled 2018-06-24: qty 22

## 2018-06-24 MED ORDER — ONDANSETRON HCL 4 MG/2ML IJ SOLN
INTRAMUSCULAR | Status: AC
  Filled 2018-06-24: qty 2

## 2018-06-24 MED ORDER — PROPOFOL 500 MG/50ML IV EMUL
INTRAVENOUS | Status: DC | PRN
  Administered 2018-06-24: 60 ug/kg/min via INTRAVENOUS

## 2018-06-24 MED ORDER — PHENYLEPHRINE HCL 10 MG/ML IJ SOLN
INTRAMUSCULAR | Status: DC | PRN
  Administered 2018-06-24: 80 ug via INTRAVENOUS

## 2018-06-24 MED ORDER — FENTANYL CITRATE (PF) 250 MCG/5ML IJ SOLN
INTRAMUSCULAR | Status: AC
  Filled 2018-06-24: qty 5

## 2018-06-24 SURGICAL SUPPLY — 28 items
ADH SKN CLS APL DERMABOND .7 (GAUZE/BANDAGES/DRESSINGS) ×1
BRUSH SCRUB EZ PLAIN DRY (MISCELLANEOUS) ×4 IMPLANT
CANISTER SUCT 3000ML PPV (MISCELLANEOUS) ×2 IMPLANT
COVER SURGICAL LIGHT HANDLE (MISCELLANEOUS) ×2 IMPLANT
COVER TRANSDUCER ULTRASND GEL (DRAPE) ×2 IMPLANT
DERMABOND ADVANCED (GAUZE/BANDAGES/DRESSINGS) ×1
DERMABOND ADVANCED .7 DNX12 (GAUZE/BANDAGES/DRESSINGS) ×1 IMPLANT
DRAPE C-ARM 42X72 X-RAY (DRAPES) ×2 IMPLANT
DRAPE LAPAROSCOPIC ABDOMINAL (DRAPES) ×2 IMPLANT
GLOVE BIO SURGEON STRL SZ 6.5 (GLOVE) ×5 IMPLANT
GLOVE BIOGEL PI IND STRL 6.5 (GLOVE) IMPLANT
GLOVE BIOGEL PI INDICATOR 6.5 (GLOVE) ×1
GOWN STRL REUS W/ TWL LRG LVL3 (GOWN DISPOSABLE) ×2 IMPLANT
GOWN STRL REUS W/TWL LRG LVL3 (GOWN DISPOSABLE) ×4
KIT BASIN OR (CUSTOM PROCEDURE TRAY) ×2 IMPLANT
KIT PLEURX DRAIN CATH 1000ML (MISCELLANEOUS) ×2 IMPLANT
KIT PLEURX DRAIN CATH 15.5FR (DRAIN) ×2 IMPLANT
KIT TURNOVER KIT B (KITS) ×2 IMPLANT
NS IRRIG 1000ML POUR BTL (IV SOLUTION) ×2 IMPLANT
PACK GENERAL/GYN (CUSTOM PROCEDURE TRAY) ×2 IMPLANT
PAD ARMBOARD 7.5X6 YLW CONV (MISCELLANEOUS) ×4 IMPLANT
SET DRAINAGE LINE (MISCELLANEOUS) IMPLANT
SUT ETHILON 3 0 FSL (SUTURE) ×2 IMPLANT
SUT VIC AB 3-0 X1 27 (SUTURE) ×2 IMPLANT
TOWEL GREEN STERILE (TOWEL DISPOSABLE) ×2 IMPLANT
TOWEL GREEN STERILE FF (TOWEL DISPOSABLE) ×1 IMPLANT
VALVE REPLACEMENT CAP (MISCELLANEOUS) IMPLANT
WATER STERILE IRR 1000ML POUR (IV SOLUTION) ×2 IMPLANT

## 2018-06-24 NOTE — Brief Op Note (Signed)
      [address]       [place] [address]             [phone]      06/24/2018  8:07 AM  PATIENT:  [name]  84 y.o. female  PRE-OPERATIVE DIAGNOSIS:  RIGHT PLEURAL EFFUSION recurrent   POST-OPERATIVE DIAGNOSIS:  RIGHT PLEURAL EFFUSION  PROCEDURE:  Procedure(s): INSERTION PLEURAL DRAINAGE CATHETER (Right)  SURGEON:  Surgeon(s) and Role:    * Grace Isaac, MD - Primary    ANESTHESIA:   MAC  EBL:  1 mL   BLOOD ADMINISTERED:none  DRAINS: right plurix cath   LOCAL MEDICATIONS USED:  LIDOCAINE   SPECIMEN:  Source of Specimen:  right plural fluid  DISPOSITION OF SPECIMEN:  PATHOLOGY  COUNTS:  YES   DICTATION: .Dragon Dictation  PLAN OF CARE: Discharge to home after PACU  PATIENT DISPOSITION:  PACU - hemodynamically stable.   Delay start of Pharmacological VTE agent (>24hrs) due to surgical blood loss or risk of bleeding: yes

## 2018-06-24 NOTE — Transfer of Care (Signed)
Immediate Anesthesia Transfer of Care Note  Patient: [name]  Procedure(s) Performed: INSERTION PLEURAL DRAINAGE CATHETER (Right )  Patient Location: PACU  Anesthesia Type:MAC  Level of Consciousness: awake, alert  and oriented  Airway & Oxygen Therapy: Patient Spontanous Breathing  Post-op Assessment: Report given to RN and Post -op Vital signs reviewed and stable  Post vital signs: Reviewed and stable  Last Vitals:  Vitals Value Taken Time  BP    Temp    Pulse 83 06/24/2018  8:16 AM  Resp 11 06/24/2018  8:16 AM  SpO2 96 % 06/24/2018  8:16 AM  Vitals shown include unvalidated device data.  Last Pain:  Vitals:   06/24/18 0636  TempSrc:   PainSc: 0-No pain         Complications: No apparent anesthesia complications

## 2018-06-24 NOTE — H&P (Signed)
    [address]       [place] [address]             [phone]                    Abiageal S Schembri Carlos Medical Record #[id number] Date of Birth: 10/29/1932  Referring: Byrum, Robert S, MD Primary Care: Fagan, Roy, MD Primary Cardiologist: Samuel McDowell, MD  Chief Complaint:    Chief Complaint  Patient presents with  . Pleural Effusion    right...x 3 thoracentesis 6/10/05/08/18 with NEG CYTOLOGY...CT ABD 04/21/18...cxr today    History of Present Illness:    [name] 84 y.o. female is seen in the office for evaluation for chronic right pleural effusion.  Patient has a history of COPD psoriatic arthritis previously on methotrexate chronic cough.  She has a history of ovarian cancer and had undergone treatment, she has a history of atrial fibrillation and hypertension.  She underwent thoracentesis on the right June 6 followed by the pulmonologist, cytology was negative for ovarian cancer.      Current Activity/ Functional Status:  Patient is independent with mobility/ambulation, transfers, ADL's, IADL's.   Zubrod Score: At the time of surgery this patient's most appropriate activity status/level should be described as: []    0    Normal activity, no symptoms []    1    Restricted in physical strenuous activity but ambulatory, able to do out light work []    2    Ambulatory and capable of self care, unable to do work activities, up and about               >50 % of waking hours                              []    3    Only limited self care, in bed greater than 50% of waking hours []    4    Completely disabled, no self care, confined to bed or chair []    5    Moribund   Past Medical History:  Diagnosis Date  . Anxiety   . Chronic blood loss anemia    03-04-2018 diverticular bleed and rectal bleeding,  transfused 2 units PRBCs 03-08-2018  . Colitis   . COPD (chronic obstructive pulmonary disease) (HCC)    pulmologist-  dr byrum  .  Depression   . Diastolic CHF, chronic (HCC)    followed by cardiology  . Diverticulosis   . Dyspnea on effort   . Dysrhythmia    a-fib  . Family history of colon cancer   . Family hx of colon cancer   . Fibromyalgia   . Genetic testing 04/07/2018   MyRisk (35 genes) @ [facility] - No pathogenic mutations detected  . GERD (gastroesophageal reflux disease)    03-17-2018 per pt has not had any gerd issues in long time and no meds prn  . Hemorrhoids   . Hiatal hernia   . History of lower GI bleeding 03/04/2018   admission--  dx diverticular bleed and rectal bleed in setting of xarelto-- xarelto stopped and transfused 03-08-2018 2 units PRBCs  . History of rectal polyps   . History of shingles   . Hypothyroidism   . IBS (irritable bowel syndrome)   . Lymphocytic colitis    FOLLOWED BY DR PERRY  . Malignant   ascites    dx at admission 09/ 2018 abdominal s/p  parencentesis 07-01-2017 2.5L,  07-08-2016  2.7L,  07-12-2017  1450ml  . Neuropathy due to chemotherapeutic drug (HCC)   . Osteoporosis   . Ovarian cancer (HCC) oncologist-  gorsuch/  dr phelps   chemotherapy 07-19-2017 to 11-04-2017  . Paroxysmal atrial fibrillation (HCC)    CARDIOLOGIST-  DR S. MCDOWELL-  first dx'd 07/ 2014---was taking xarelto up until 03-07-2018 stopped due to lower GI bleed  . Pleural effusion    s/p  right thoracentesis, 02-2018 1.3L and 03-17-2018 right thoracentesis 640ml , post cxr no residual effusion  . Psoriatic arthritis (HCC)   . Schatzki's ring    S/P  DILATERAL 2013  . Seasonal allergic rhinitis     Past Surgical History:  Procedure Laterality Date  . CARDIOVASCULAR STRESS TEST  09/23/2012   Low risk lexiscan nuclear study w/ apical thinning but no evidence of ischemia/  normal LV function and wall motion , ef 75%  . CATARACT EXTRACTION W/ INTRAOCULAR LENS  IMPLANT, BILATERAL  10/2016  . COLONOSCOPY    . DEBULKING N/A 03/20/2018   Procedure: DEBULKING;  Surgeon: Phelps, Shawna B, MD;  Location:  [facility];  Service: Gynecology;  Laterality: N/A;  . EXAM UNDER ANESTHESIA WITH MANIPULATION OF KNEE Left 12-20-2003  dr wainer   post TKA  . FEMUR IM NAIL Left 12/11/2013   Procedure: INTRAMEDULLARY (IM) NAIL FEMORAL;  Surgeon: Frank V Aluisio, MD;  Location: [facility];  Service: Orthopedics;  Laterality: Left;  . HYSTERECTOMY ABDOMINAL WITH SALPINGO-OOPHORECTOMY Bilateral 03/20/2018   Procedure: TOTAL HYSTERECTOMY ABDOMINAL WITH BILATERAL  SALPINGO-OOPHORECTOMY;  Surgeon: Phelps, Shawna B, MD;  Location: [facility];  Service: Gynecology;  Laterality: Bilateral;  . IR FLUORO GUIDE PORT INSERTION RIGHT  07/22/2017  . IR PARACENTESIS  07/12/2017  . IR THORACENTESIS ASP PLEURAL SPACE W/IMG GUIDE  03/17/2018  . IR THORACENTESIS ASP PLEURAL SPACE W/IMG GUIDE  05/08/2018  . IR US GUIDE VASC ACCESS RIGHT  07/22/2017  . KNEE ARTHROSCOPY W/ LATERAL RELEASE Left 09-03-2005   dr wainer  [facility]   w/  Lysis Adhesions,  excision loose body's  . LAPAROSCOPIC CHOLECYSTECTOMY  12-04-2010  dr zeigler  . LAPAROTOMY N/A 03/20/2018   Procedure: EXPLORATORY LAPAROTOMY;  Surgeon: Phelps, Shawna B, MD;  Location: [facility];  Service: Gynecology;  Laterality: N/A;  . OMENTECTOMY N/A 03/20/2018   Procedure: OMENTECTOMY;  Surgeon: Phelps, Shawna B, MD;  Location: [facility];  Service: Gynecology;  Laterality: N/A;  . TOTAL KNEE ARTHROPLASTY Left 09-08-2003   dr wainer  [facility]  . TOTAL KNEE REVISION  08/06/2012   Procedure: TOTAL KNEE REVISION;  Surgeon: Frank V Aluisio, MD;  Location: [facility];  Service: Orthopedics;  Laterality: Left;  Left Total Knee Arthroplasty Revision  . TRANSTHORACIC ECHOCARDIOGRAM  08/20/2017   ef 60-65%,  grade 1 diastolic dysfunction/  trivial AR and TR  . Uterine polypectomy      Family History  Problem Relation Age of Onset  . Heart disease Father        pacemaker  . Lung cancer Father        smoker; deceased 90  . Colon cancer Mother 65       2nd rectal ca at 78  . Colon cancer Maternal Grandfather         dx 70s; deceased 70s  . Depression Sister   . Leukemia Maternal Aunt        deceased 82       Social History   Tobacco Use  Smoking Status Former Smoker  . Packs/day: 1.00  . Years: 20.00  . Pack years: 20.00  . Types: Cigarettes  . Last attempt to quit: 10/08/1980  . Years since quitting: 37.7  Smokeless Tobacco Never Used    Social History   Substance and Sexual Activity  Alcohol Use Yes   Comment: rarely, 12-23-15 rarely     Allergies  Allergen Reactions  . Codeine Itching    Current Facility-Administered Medications  Medication Dose Route Frequency Provider Last Rate Last Dose  . ceFAZolin (ANCEF) IVPB 2g/100 mL premix  2 g Intravenous 30 min Pre-Op ,  B, MD        Pertinent items are noted in HPI.   Review of Systems:     Cardiac Review of Systems: [Y] = yes  or   [ N ] = no   Chest Pain [ n   ]  Resting SOB [ y  ] Exertional SOB  [ y ]  Orthopnea [ n ]   Pedal Edema [ n  ]    Palpitations [ y ] Syncope  [n  ]   Presyncope [n   ]   General Review of Systems: [Y] = yes [  ]=no Constitional: recent weight change [ n ];  Wt loss over the last 3 months [   ] anorexia [  ]; fatigue [  ]; nausea [  ]; night sweats [  ]; fever [  ]; or chills [  ];           Eye : blurred vision [  ]; diplopia [   ]; vision changes [  ];  Amaurosis fugax[  ]; Resp: cough [  ];  wheezing[y  ];  hemoptysis[  ]; shortness of breath[y ]; paroxysmal nocturnal dyspnea[  ]; dyspnea on exertion[y  ]; or orthopnea[  ];  GI:  gallstones[  ], vomiting[  ];  dysphagia[  ]; melena[  ];  hematochezia [  ]; heartburn[  ];   Hx of  Colonoscopy[  ]; GU: kidney stones [  ]; hematuria[  ];   dysuria [  ];  nocturia[  ];  history of     obstruction [  ]; urinary frequency [  ]             Skin: rash, swelling[  ];, hair loss[  ];  peripheral edema[  ];  or itching[  ]; Musculosketetal: myalgias[  ];  joint swelling[  ];  joint erythema[  ];  joint pain[  ];  back pain[  ];  Heme/Lymph:  bruising[  ];  bleeding[  ];  anemia[  ];  Neuro: TIA[  ];  headaches[  ];  stroke[  ];  vertigo[  ];  seizures[  ];   paresthesias[  ];  difficulty walking[  ];  Psych:depression[  ]; anxiety[  ];  Endocrine: diabetes[  ];  thyroid dysfunction[  ];  Immunizations: Flu up to date [ y ]; Pneumococcal up to date [y  ];  Other:    PHYSICAL EXAMINATION: Ht 5' 4" (1.626 m)   Wt 73.9 kg   BMI 27.98 kg/m  General appearance: alert, cooperative and no distress Head: Normocephalic, without obvious abnormality, atraumatic Neck: no adenopathy, no carotid bruit, no JVD, supple, symmetrical, trachea midline and thyroid not enlarged, symmetric, no tenderness/mass/nodules Lymph nodes: Cervical, supraclavicular, and axillary nodes normal. Resp: diminished breath sounds RLL Back: symmetric, no curvature. ROM normal.   No CVA tenderness. Cardio: regular rate and rhythm, S1, S2 normal, no murmur, click, rub or gallop GI: soft, non-tender; bowel sounds normal; no masses,  no organomegaly Extremities: extremities normal, atraumatic, no cyanosis or edema and Homans sign is negative, no sign of DVT Neurologic: Grossly normal  Diagnostic Studies & Laboratory data:     Recent Radiology Findings:   Dg Chest 2 View  Result Date: 06/24/2018 CLINICAL DATA:  Preoperative evaluation for right-sided chest tube for fluid. EXAM: CHEST - 2 VIEW COMPARISON:  Prior radiograph from 06/17/2018. FINDINGS: Cardiac and mediastinal silhouettes are stable in size and contour, and remain within normal limits. Aortic atherosclerosis. Right-sided Port-A-Cath in place with tip overlying the cavoatrial junction. Blunting of the right costophrenic angle suggestive of a small right pleural effusion, slightly decreased from previous. Mild bibasilar subsegmental atelectasis. No focal infiltrates. No pneumothorax. No pulmonary edema. No acute osseous abnormality. Multilevel spurring noted within the visualized spine. IMPRESSION: 1. Mild  blunting of the right costophrenic angle, suggesting small right pleural effusion, slightly decreased relative to 06/17/2018. 2. Mild bibasilar subsegmental atelectasis. 3. Aortic atherosclerosis. Electronically Signed   By: Benjamin  McClintock M.D.   On: 06/24/2018 06:17   Dg Chest 2 View  Result Date: 06/17/2018 CLINICAL DATA:  History of ovarian carcinoma.  Pleural effusion. EXAM: CHEST - 2 VIEW COMPARISON:  June 03, 2018 FINDINGS: Port-A-Cath tip is in the superior vena cava. No pneumothorax. There is a fairly small right pleural effusion with right base atelectasis. Lungs elsewhere are clear. Heart size and pulmonary vascularity are normal. No adenopathy. There is aortic atherosclerosis. There is degenerative change in the thoracic spine. No blastic or lytic bone lesions are evident. IMPRESSION: Small right pleural effusion with right base atelectasis. No edema or consolidation. No adenopathy evident. There is aortic atherosclerosis. Aortic Atherosclerosis (ICD10-I70.0). Electronically Signed   By: William  Woodruff III M.D.   On: 06/17/2018 15:53   Dg Chest 2 View  Result Date: 06/03/2018 CLINICAL DATA:  History of right pleural effusion, shortness of breath, cough EXAM: CHEST - 2 VIEW COMPARISON:  Portable chest x-ray of 05/08/2018 FINDINGS: The lungs are slightly hyperaerated. There do appear to be small pleural effusions present with mild volume loss at the lung bases. No pneumonia is seen. Mediastinal and hilar contours are unchanged and the heart is within normal limits in size. There are degenerative changes in the mid to lower thoracic spine. Port-A-Cath is present with the tip overlying the lower SVC. IMPRESSION: 1. Suspect small pleural effusions with mild basilar atelectasis. 2. Hyperaeration.  No pneumonia. Electronically Signed   By: Paul  Barry M.D.   On: 06/03/2018 17:06     I have independently reviewed the above radiology studies  and reviewed the findings with the patient.    Recent Lab Findings: Lab Results  Component Value Date   WBC 7.7 06/24/2018   HGB 11.7 (L) 06/24/2018   HCT 36.9 06/24/2018   PLT 206 06/24/2018   GLUCOSE 98 05/28/2018   CHOL 147 11/14/2017   ALT 9 05/28/2018   AST 13 (L) 05/28/2018   NA 137 05/28/2018   K 4.0 05/28/2018   CL 98 05/28/2018   CREATININE 1.04 (H) 05/28/2018   BUN 15 05/28/2018   CO2 30 05/28/2018   TSH 0.388 11/10/2017   INR 0.90 07/22/2017   HGBA1C 5.7 (H) 06/12/2013      Assessment / Plan:   Patient with symptomatic recurrent right pleural effusion.  At the request of pulmonary have seen the   patient and discussed with her various options including right VATS versus basement of Pleurx catheter.  All previous cytologies have been negative.  She was willing to consider placement of a right Pleurx catheter for drainage.  She noted that previously each time the fluid was drained she did become symptomatically better.  She was not interested in any more invasive procedure.  We will plan to proceed with placement of right Pleurx catheter .     The goals risks and alternatives of the planned surgical procedure Procedure(s): INSERTION PLEURAL DRAINAGE CATHETER (Right)  have been discussed with the patient in detail. The risks of the procedure including death, infection, stroke, myocardial infarction, bleeding, blood transfusion have all been discussed specifically.  I have quoted Teiana S Boardley a 1 % of perioperative mortality and a complication rate as high as 10 %. The patient's questions have been answered.Hayven S Stotz is willing  to proceed with the planned procedure.   B  MD      [address] [place] [address] Office [phone]   Beeper [phone]  06/24/2018 7:00 AM      

## 2018-06-24 NOTE — Discharge Instructions (Signed)
See information given in office about [facility]

## 2018-06-24 NOTE — Discharge Planning (Signed)
Demetrios Byron J. Zaylin Pistilli, RN, BSN, NCM [phone] Spoke with pt at bedside regarding discharge planning for Home Health Services. Offered pt list of home health agencies to choose from.  Pt chose [facility] to render services. Tiffany of [facility] notified. Patient made aware that [facility] will be in contact in 24-48 hours.  No DME needs identified at this time.  

## 2018-06-24 NOTE — Progress Notes (Signed)
When sleeping, after meds, her o2sat will drop to 86-89, when awake/interactive will rise to >95%

## 2018-06-24 NOTE — Anesthesia Postprocedure Evaluation (Signed)
Anesthesia Post Note  Patient: [name]  Procedure(s) Performed: INSERTION PLEURAL DRAINAGE CATHETER (Right )     Patient location during evaluation: PACU Anesthesia Type: MAC Level of consciousness: awake and alert Pain management: pain level controlled Vital Signs Assessment: post-procedure vital signs reviewed and stable Respiratory status: spontaneous breathing, nonlabored ventilation, respiratory function stable and patient connected to nasal cannula oxygen Cardiovascular status: stable and blood pressure returned to baseline Postop Assessment: no apparent nausea or vomiting Anesthetic complications: no    Last Vitals:  Vitals:   06/24/18 0915 06/24/18 0925  BP: (!) 105/39 (!) 121/51  Pulse: 71 80  Resp: 15   Temp:    SpO2: 93% 91%    Last Pain:  Vitals:   06/24/18 0915  TempSrc:   PainSc: Tyler Deis

## 2018-06-25 ENCOUNTER — Telehealth: Payer: Self-pay

## 2018-06-25 ENCOUNTER — Telehealth (HOSPITAL_COMMUNITY): Payer: Self-pay

## 2018-06-25 ENCOUNTER — Encounter (HOSPITAL_COMMUNITY): Payer: Self-pay | Admitting: Cardiothoracic Surgery

## 2018-06-25 NOTE — Telephone Encounter (Signed)
Spoke with [name], patient's daughter after home health drained patient's PleurX catheter that was inserted yesterday by Dr. Servando Snare.  She stated that [name] had excruciating pain after draining her PleurX today.  She felt the nurse drained her too fast.  She stated that it lasted for 15 minutes after drain was stopped.  I advised her that she needed to slow the drainage down to a comfortable level so pain could be tolerated.  She acknowledged receipt.  Also, Dr. Servando Snare was asked about medication that she could take if needed for the pain.  He advised that she could take IBprofen for a few days if needed to help, but she was to not take it daily for risk of stomach issues.

## 2018-06-25 NOTE — Telephone Encounter (Signed)
Patient's daughter called concerned about her mother stating that she was having muscle spasms that "take her breath away" in her back and shoulder and wanted to know if there was something we could give her.  I advised that we usually do not prescribe any medication after this procedure but there were alternatives she could try, like tylenol.  She is s/p PleurX catheter placement yesterday with Dr. Servando Snare.  She stated that the patient took her prescribed morphine for the pain with no relief.  I advised that she could try alternating hot and cold packs to see if that would help provide relief.  She acknowledged receipt.  She also stated that she has not heard from home health as to have her drain, drained after it being inserted.  I advised patient's daughter that [facility] would be the agency taking over drains and she stated she would give them a call.  I stated if she had any other questions or concerns to give the office a call back, she acknowledged receipt.

## 2018-06-25 NOTE — Telephone Encounter (Signed)
PT called and spoke to [name], Mrs. Mikkelsen's daughter, about pt receiving [facility] services following her outpatient surgery on Tuesday. The dtr did confirm that pt is receiving [facility] to drain the plueral effusion and she is unsure of how long they will be coming. PT explained that pt can't be seen in both [facility] services and outpatient services so PT highly encouraged her to obtain HHPT for her mother in order to maintain her mobility gained in OPPT thus far. At this time, pt will be placed on hold until she is d/c from [facility] services and will be re-evaluated when able.   Geraldine Solar PT, DPT

## 2018-06-26 ENCOUNTER — Ambulatory Visit (HOSPITAL_COMMUNITY): Payer: Medicare Other

## 2018-06-27 NOTE — Op Note (Signed)
NAME: [name], VETTER MEDICAL RECORD IW:[id number] ACCOUNT [id number] DATE OF BIRTH:11/03/1932 FACILITY: [facility] LOCATION: [place], [place]  OPERATIVE REPORT  DATE OF PROCEDURE:  06/24/2018  PREOPERATIVE DIAGNOSIS:  Recurrent right pleural effusion with history of ovarian carcinoma.  POSTOPERATIVE DIAGNOSIS:  Recurrent right pleural effusion with history of ovarian carcinoma.  SURGICAL PROCEDURE:  Placement of right PleurX catheter with ultrasound and fluoroscopic guidance.  SURGEON:  Lanelle Bal, MD  BRIEF HISTORY:  The patient is an 84 year old female followed by Dr. Johny Blamer who is seeing her for recurrent right pleural effusion.  She has had this tapped on several occasions over the past several months.  With each thoracentesis, she notes improvement  in her respiratory status.  Cytologies have been negative for malignancy.  Because of the recurrent nature, the patient was referred for consideration of a PleurX catheter.  Risks and options of surgery were discussed with her and she was agreeable with  proceeding with placement of right PleurX catheter under MAC anesthesia.  DESCRIPTION OF PROCEDURE:  The patient was brought to the operating room with the right side preoperatively marked with the MAC anesthesia.  She was placed in supine position with the right side tilted up slightly.  The right chest was prepped with  Betadine and appropriate timeout was performed.  Then, using the SonoSite ultrasound, we confirmed the location of pleural fluid in the right lower chest.  Lidocaine 1% was infiltrated and a 16 gauge needle with a small catheter was introduced into the  right chest.  There was free flow of pleural fluid.  A guidewire under fluoroscopy guidance was positioned into the right chest cavity with the wire in good position, we then infiltrated additional lidocaine more anteriorly and between the 2 sites, the  PleurX catheter was tunneled.  We then placed  a dilator over the guidewire and then placed a dilator with a peel-away sheath into the right chest under fluoroscopic guidance over the guidewire.  The PleurX catheter was then placed over the guidewire with  the orange introducer and the catheter positioned into the right chest.  The wire and peel away sheath were removed.  We then evacuated approximately 600 mL of straw-colored fluid from the right chest and submitted this to pathology for cytologic  examination.  The catheter was positioned so the cloth sheath was under the skin at the insertion site.  A #2 nylon suture was used to secure the catheter in place and the small puncture site was closed with interrupted 3-0 Vicryl and Dermabond.   Appropriate dressing was placed.  Sponge and needle count was reported as correct.    The patient tolerated procedure with minimal blood loss.  She was then transferred to the recovery room for further postoperative care, having tolerated the procedure without difficulty.    She had previously been on Xarelto.  This will be held tonight and resume tomorrow.  AN/NUANCE  D:06/27/2018 T:06/27/2018 JOB:[id number]

## 2018-06-30 ENCOUNTER — Inpatient Hospital Stay (HOSPITAL_BASED_OUTPATIENT_CLINIC_OR_DEPARTMENT_OTHER): Payer: Medicare Other | Admitting: Hematology and Oncology

## 2018-06-30 ENCOUNTER — Inpatient Hospital Stay: Payer: Medicare Other | Attending: Hematology and Oncology

## 2018-06-30 ENCOUNTER — Inpatient Hospital Stay: Payer: Medicare Other

## 2018-06-30 ENCOUNTER — Encounter: Payer: Self-pay | Admitting: Hematology and Oncology

## 2018-06-30 ENCOUNTER — Telehealth: Payer: Self-pay | Admitting: Hematology and Oncology

## 2018-06-30 VITALS — BP 132/62 | HR 97 | Temp 98.3°F | Resp 18 | Ht 64.0 in | Wt 168.4 lb

## 2018-06-30 DIAGNOSIS — C562 Malignant neoplasm of left ovary: Secondary | ICD-10-CM

## 2018-06-30 DIAGNOSIS — Z9221 Personal history of antineoplastic chemotherapy: Secondary | ICD-10-CM | POA: Diagnosis not present

## 2018-06-30 DIAGNOSIS — M7918 Myalgia, other site: Secondary | ICD-10-CM | POA: Insufficient documentation

## 2018-06-30 DIAGNOSIS — Z7901 Long term (current) use of anticoagulants: Secondary | ICD-10-CM | POA: Diagnosis not present

## 2018-06-30 DIAGNOSIS — Z79811 Long term (current) use of aromatase inhibitors: Secondary | ICD-10-CM | POA: Diagnosis not present

## 2018-06-30 DIAGNOSIS — Z8719 Personal history of other diseases of the digestive system: Secondary | ICD-10-CM | POA: Diagnosis not present

## 2018-06-30 DIAGNOSIS — I700 Atherosclerosis of aorta: Secondary | ICD-10-CM

## 2018-06-30 DIAGNOSIS — R791 Abnormal coagulation profile: Secondary | ICD-10-CM

## 2018-06-30 DIAGNOSIS — E441 Mild protein-calorie malnutrition: Secondary | ICD-10-CM

## 2018-06-30 DIAGNOSIS — J910 Malignant pleural effusion: Secondary | ICD-10-CM | POA: Insufficient documentation

## 2018-06-30 DIAGNOSIS — R609 Edema, unspecified: Secondary | ICD-10-CM | POA: Diagnosis not present

## 2018-06-30 DIAGNOSIS — Z90722 Acquired absence of ovaries, bilateral: Secondary | ICD-10-CM | POA: Insufficient documentation

## 2018-06-30 DIAGNOSIS — Z79899 Other long term (current) drug therapy: Secondary | ICD-10-CM | POA: Insufficient documentation

## 2018-06-30 DIAGNOSIS — R971 Elevated cancer antigen 125 [CA 125]: Secondary | ICD-10-CM | POA: Diagnosis not present

## 2018-06-30 DIAGNOSIS — C569 Malignant neoplasm of unspecified ovary: Secondary | ICD-10-CM | POA: Diagnosis present

## 2018-06-30 DIAGNOSIS — Z90710 Acquired absence of both cervix and uterus: Secondary | ICD-10-CM | POA: Diagnosis not present

## 2018-06-30 DIAGNOSIS — J90 Pleural effusion, not elsewhere classified: Secondary | ICD-10-CM

## 2018-06-30 LAB — COMPREHENSIVE METABOLIC PANEL
ALBUMIN: 3 g/dL — AB (ref 3.5–5.0)
ALT: 6 U/L (ref 0–44)
ANION GAP: 8 (ref 5–15)
AST: 14 U/L — ABNORMAL LOW (ref 15–41)
Alkaline Phosphatase: 59 U/L (ref 38–126)
BILIRUBIN TOTAL: 0.3 mg/dL (ref 0.3–1.2)
BUN: 14 mg/dL (ref 8–23)
CALCIUM: 8.2 mg/dL — AB (ref 8.9–10.3)
CO2: 28 mmol/L (ref 22–32)
Chloride: 103 mmol/L (ref 98–111)
Creatinine, Ser: 1.01 mg/dL — ABNORMAL HIGH (ref 0.44–1.00)
GFR calc Af Amer: 58 mL/min — ABNORMAL LOW (ref >60)
GFR, EST NON AFRICAN AMERICAN: 50 mL/min — AB (ref >60)
GLUCOSE: 102 mg/dL — AB (ref 70–99)
Potassium: 3.9 mmol/L (ref 3.5–5.1)
Sodium: 139 mmol/L (ref 135–145)
TOTAL PROTEIN: 6.7 g/dL (ref 6.5–8.1)

## 2018-06-30 LAB — CBC WITH DIFFERENTIAL/PLATELET
BASOS PCT: 0 %
Basophils Absolute: 0 10*3/uL (ref 0.0–0.1)
Eosinophils Absolute: 0.4 10*3/uL (ref 0.0–0.5)
Eosinophils Relative: 5 %
HEMATOCRIT: 35.2 % (ref 34.8–46.6)
HEMOGLOBIN: 11.3 g/dL — AB (ref 11.6–15.9)
LYMPHS ABS: 1.7 10*3/uL (ref 0.9–3.3)
LYMPHS PCT: 20 %
MCH: 29 pg (ref 25.1–34.0)
MCHC: 32.1 g/dL (ref 31.5–36.0)
MCV: 90.3 fL (ref 79.5–101.0)
MONO ABS: 0.9 10*3/uL (ref 0.1–0.9)
MONOS PCT: 11 %
NEUTROS ABS: 5.2 10*3/uL (ref 1.5–6.5)
NEUTROS PCT: 64 %
Platelets: 198 10*3/uL (ref 145–400)
RBC: 3.9 MIL/uL (ref 3.70–5.45)
RDW: 13 % (ref 11.2–14.5)
WBC: 8.1 10*3/uL (ref 3.9–10.3)

## 2018-06-30 MED ORDER — SODIUM CHLORIDE 0.9% FLUSH
10.0000 mL | Freq: Once | INTRAVENOUS | Status: AC
  Administered 2018-06-30: 10 mL
  Filled 2018-06-30: qty 10

## 2018-06-30 MED ORDER — HEPARIN SOD (PORK) LOCK FLUSH 100 UNIT/ML IV SOLN
250.0000 [IU] | Freq: Once | INTRAVENOUS | Status: AC
  Administered 2018-06-30: 500 [IU]
  Filled 2018-06-30: qty 5

## 2018-06-30 NOTE — Telephone Encounter (Signed)
Gave patient avs and calendar.  Made 10/17 appt @ 2:00 pm per request of NG.

## 2018-07-01 ENCOUNTER — Telehealth: Payer: Self-pay

## 2018-07-01 ENCOUNTER — Other Ambulatory Visit: Payer: Self-pay | Admitting: Hematology and Oncology

## 2018-07-01 LAB — CA 125: Cancer Antigen (CA) 125: 148 U/mL — ABNORMAL HIGH (ref 0.0–38.1)

## 2018-07-01 NOTE — Telephone Encounter (Signed)
-----   Message from Heath Lark, MD sent at 07/01/2018  7:59 AM EDT ----- Regarding: CA-125 Let her know result is better ----- Message ----- From: Interface, Lab In [place] Sent: 06/30/2018  10:41 AM EDT To: Heath Lark, MD

## 2018-07-01 NOTE — Progress Notes (Signed)
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 CT ABDOMEN PELVIS W CONTRAST    Standing Status:   Future    Standing Expiration Date:   07/01/2019    Order Specific Question:   If indicated for the ordered procedure, I authorize the administration of contrast media per Radiology protocol    Answer:   Yes    Order Specific Question:   Preferred imaging location?    Answer:   [facility]    Order Specific Question:   Radiology Contrast Protocol - do NOT remove file path    Answer:   [email or web address]    INTERVAL HISTORY: Please see below for problem oriented charting. She returns with her daughter for further follow-up She had recent Pleurx catheter placement for recurrent pleural effusion Shortness of breath has improved She denies significant hot flashes She has diffuse musculoskeletal pain, well controlled with current prescription pain medicine Denies abdominal bloating,  constipation or nausea.  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 Interim finding of moderate ascites within the abdomen and pelvis with additional finding of diffuse nodular infiltration of the omentum and anterior mesenteric fat, the appearance would be consistent with peritoneal carcinomatosis/metastatic disease. Increasing retroperitoneal and upper abdominal adenopathy. 2. Re- demonstrated 3.8 cm cyst in the right adnexa. Enlarging soft tissue density in the left adnexa now with possible cystic component posteriorly. In light of the above findings, concern is for ovarian neoplasm. Correlation with pelvic ultrasound recommended. 3. Small right-sided pleural effusion, new since prior study 4. Stable hypodense splenic lesions since 2017.     06/25/2017 Imaging    US pelvis: 2.9 cm simple appearing cyst in the right ovary. Left ovary grossly unremarkable. Large volume ascites in the pelvis    06/30/2017 - 07/01/2017 Hospital Admission    She was admitted for evaluation of abdominal pain and ascites    07/01/2017 Pathology Results    PERITONEAL/ASCITIC FLUID(SPECIMEN 1 OF 1 COLLECTED 07/01/17): - POORLY DIFFERENTIATED CARCINOMA; SEE  COMMENT Source Peritoneal/Ascitic Fluid, (specimen 1 of 1 collected 07/01/17) Gross Specimen: Received is/are 1000 cc's of brownish fluid. (BS:bs) Prepared: # Smears: 0 # Concentration Technique Slides (i.e. ThinPrep): 1 # Cell Block:  1 Additional Studies: Also received Hematology slide - [id number]. Comment The tumor cells are positive for cytokeratin 7 and Pax-8 but negative for cytokeratin 20, CDX-2, GATA-3, Napsin-A and TTF-1. Based on the immunoprofile a gynecology primary is favored    07/01/2017 Procedure    Successful ultrasound-guided diagnostic and therapeutic paracentesis yielding 2.5 liters of peritoneal fluid    07/07/2017 - 07/09/2017 Hospital Admission    She was admitted for management of malignant ascites    07/08/2017 Procedure    Successful ultrasound-guided therapeutic paracentesis yielding 2.7 liters liters of peritoneal fluid    07/12/2017 Procedure    Successful ultrasound-guided paracentesis yielding 1450 mL of peritoneal fluid    07/18/2017 - 07/24/2017 Hospital Admission    She was admitted for expedited treatment    07/18/2017 Tumor Marker    Patient's tumor was tested for the following markers: CA125 Results of the tumor marker test revealed 1941    07/19/2017 - 02/04/2018 Chemotherapy    The patient had 6 cycles of carboplatin & Taxol for chemotherapy treatment, followed by 3 more cycles of carboplatin only     08/06/2017 Procedure    Successful ultrasound-guided therapeutic paracentesis yielding 2.6 liters of peritoneal fluid.    08/09/2017 Tumor Marker    Patient's tumor was tested for the following markers: CA125 Results of the tumor marker test revealed 1665    08/15/2017 Tumor Marker    Patient's tumor was tested for the following markers: CA125 Results of the tumor marker test revealed 937.9    08/20/2017 Imaging    ECHO: Normal LV size with EF 60-65%. Normal RV size and systolic function. No significant valvular abnormalities.    09/18/2017 Imaging    Chest Impression:  1. No evidence thoracic metastasis. 2. Interval increase and RIGHT pleural effusion.  Abdomen / Pelvis Impression:  1. Interval decrease in intraperitoneal free fluid. 2. Interval decrease in omental  nodularity in the LEFT ventral peritoneal space. 3. Interval decrease in nodularity associated with the LEFT ovary. 4. Cystic portion of the RIGHT ovary is increased mildly in size.    09/20/2017 Tumor Marker    Patient's tumor was tested for the following markers: CA125 Results of the tumor marker test revealed 347    10/14/2017 Tumor Marker    Patient's tumor was tested for the following markers: CA125 Results of the tumor marker test revealed 307.4    11/04/2017 Tumor Marker    Patient's tumor was tested for the following markers: CA125 Results of the tumor marker test revealed 262.5    11/28/2017 Imaging    1. Interval decrease in right pleural effusion with resolution of right atelectasis seen previously. 2. New small left pleural effusion, symmetric to the right. 3. No intraperitoneal free fluid on the current study. 4. Continued further decrease in left omental disease, appearing less confluent today than on the prior study. 5. Left ovary remains normal in appearance today and the right adnexal cystic lesion is decreased in size compared to prior study. 6. 14 mm pancreatic cyst is unchanged. Continued attention on follow-up imaging recommended. 7. Aortic Atherosclerois (ICD10-170.0)    12/13/2017 Tumor Marker    Patient's tumor was tested for the following markers: CA125 Results of the tumor marker test revealed 197.7    01/03/2018 Tumor Marker    Patient's  tumor was tested for the following markers: CA125 Results of the tumor marker test revealed 183.1    01/14/2018 Tumor Marker    Patient's tumor was tested for the following markers: CA125 Results of the tumor marker test revealed 177.4    02/04/2018 Tumor Marker    Patient's tumor was tested for the following markers: CA125 Results of the tumor marker test revealed 168.5    02/25/2018 Imaging    1. Omental carcinomatosis appears qualitatively stable to slightly decreased. Stable mild peritoneal thickening in the paracolic  gutters. Stable right adnexal cyst. No ascites. No new or progressive metastatic disease in the abdomen or pelvis. 2. Small dependent right pleural effusion is increased. 3. Cystic pancreatic body lesion is decreased and now subcentimeter in size, suggesting a benign lesion. 4. Aortic Atherosclerosis (ICD10-I70.0).    03/03/2018 - 03/07/2018 Hospital Admission    She was hospitalized for GI bleed requiring blood transfusions. Xarelto was placed on hold    03/07/2018 PET scan    1. Persistent hazy omental interstitial nodularity but no hypermetabolism or discrete measurable nodules. No abdominal ascites. 2. No findings for metastatic disease involving the chest. 3. Moderate-sized right pleural effusion and small left pleural effusion.     03/20/2018 Pathology Results    1. Ovary and fallopian tube, right - OVARY AND FALLOPIAN TUBE INVOLVED BY SEROUS CARCINOMA. - PARATUBAL CYST. 2. Uterus +/- tubes/ovaries, neoplastic, cervix, left ovary and fallopian tube - LEFT OVARY: HIGH GRADE SEROUS CARCINOMA WITH TREATMENT EFFECT, SPANNING 2.5 CM. CARCINOMA INVOLVES OVARIAN SURFACE. SEE ONCOLOGY TABLE. - LEFT FALLOPIAN TUBE: INVOLVED BY SEROUS CARCINOMA. - UTERUS: -ENDOMETRIUM: INACTIVE ENDOMETRIUM. NO HYPERPLASIA OR MALIGNANCY. -MYOMETRIUM: UNREMARKABLE. NO MALIGNANCY. -SEROSA: INVOLVED BY SEROUS CARCINOMA. - CERVIX: ENDOCERVICAL POLYP. NO MALIGNANCY. 3. Omentum, resection for tumor - INVOLVED BY SEROUS CARCINOMA. 4. Soft tissue, biopsy, mesenteric nodule - INVOLVED BY SEROUS CARCINOMA. Microscopic Comment 2. OVARY or FALLOPIAN TUBE or PRIMARY PERITONEUM: Procedure: Total hysterectomy and bilateral salpingo-oophorectomy. Omentectomy. Mesenteric lymph node biopsy. Specimen Integrity: Intact. Tumor Site: Left ovary. Ovarian Surface Involvement (required only if applicable): Present. Fallopian Tube Surface Involvement (required only if applicable): Present, bilateral. Tumor Size: 2.5  cm. Histologic Type: High grade serous carcinoma. Histologic Grade: High grade. Implants (required for advanced stage serous/seromucinous borderline tumors only): N/A. Other Tissue/ Organ Involvement: Bilateral fallopian tubes, right ovary, uterine serosa, omentum. Largest Extrapelvic Peritoneal Focus (required only if applicable): Microscopic, estimated 0.5 cm (omentum). Peritoneal/Ascitic Fluid: Prior Positive ([id number]). Treatment Effect (required only for high-grade serous carcinomas): Present in left ovary. CRS2. Regional Lymph Nodes: No lymph nodes submitted/identified. Pathologic Stage Classification (pTNM, AJCC 8th Edition): ypT3b, ypNX Representative Tumor Block: 1A, 1B, 61F, 72F. Comment(s): The right ovary has only surface deposits with a large paratubal cyst. The left ovary has intraparenchymal tumor with associated treatment effect. Thus the tumor location is classified as a left ovarian primary.    03/20/2018 Surgery    Procedure(s) Performed:  1. Exploratory laparotomy with total hysterectomy and bilateral salpingo-oophorectomy 2. Infragastic Omentectomy  3. Debulking to <1cm gross residual disease   Surgeon: Mart Piggs, MD  Specimens: Uterus Cervix, Bilateral tubes / ovaries and omentum. Mesenteric nodule.  Operative Findings: Debulked to gross residual disease <1cm; however there is miliary disease in multiple locations including the majority of the abdominal peritoneum (anterior abdominal wall, bilateral gutters), diaphragm (Right>left), majority of small bowel mesentary. Normal appendix. Normal small uterus. Right ovary with a cystic lesion ~3cm, some adhesive disease of right adnexa to rectum/sigmoid. Gross omental  disease, which was resected with the omentectomy. Smooth liver surface, but again, diaphragmatic disease noted.       03/20/2018 Genetic Testing    Patient has genetic testing done for ER/PR. Results revealed patient has ER: 90%, PR 0%.      03/31/2018 Tumor Marker    Patient's tumor was tested for the following markers: CA125 Results of the tumor marker test revealed 215.8     Genetic Testing    Patient has genetic testing done for BRCA 1. Results revealed patient has the following: BRCA 1: no loss of expression.     Genetic Testing    Patient has genetic testing done for MMR . Results revealed patient has the following:  MMR: normal    03/31/2018 Genetic Testing    Patient has genetic testing done for BRCA1/2. Results revealed patient has no actionable mutations. She is found to have [place] genetic change of unknown significance    04/21/2018 Imaging    1. No definite findings of residual or recurrent metastatic disease in the abdomen or pelvis status post interval TAHBSO and omentectomy. Stable minimal thickening in the paracolic gutters without discrete peritoneal nodularity. 2. Trace free fluid in the pelvic cul-de-sac. 3. Stable small dependent right pleural effusion. 4. Subcentimeter pancreatic body cystic lesion is stable to slightly decreased. 5. Aortic Atherosclerosis (ICD10-I70.0).    04/21/2018 Tumor Marker    Patient's tumor was tested for the following markers: CA125 Results of the tumor marker test revealed 187.3    04/23/2018 -  Anti-estrogen oral therapy    She is placed on Femara    05/08/2018 Procedure    Successful ultrasound guided right thoracentesis yielding 800 mL of pleural fluid. Fluid cytology is negative for malignancy     05/28/2018 Tumor Marker    Patient's tumor was tested for the following markers: CA125 Results of the tumor marker test revealed 190    06/30/2018 Tumor Marker    Patient's tumor was tested for the following markers: CA125 Results of the tumor marker test revealed 148     REVIEW OF SYSTEMS:   Constitutional: Denies fevers, chills or abnormal weight loss Eyes: Denies blurriness of vision Ears, nose, mouth, throat, and face: Denies mucositis or sore  throat Cardiovascular: Denies palpitation, chest discomfort or lower extremity swelling Gastrointestinal:  Denies nausea, heartburn or change in bowel habits Skin: Denies abnormal skin rashes Lymphatics: Denies new lymphadenopathy or easy bruising Neurological:Denies numbness, tingling or new weaknesses Behavioral/Psych: Mood is stable, no new changes  All other systems were reviewed with the patient and are negative.  I have reviewed the past medical history, past surgical history, social history and family history with the patient and they are unchanged from previous note.  ALLERGIES:  is allergic to codeine.  MEDICATIONS:  Current Outpatient Medications  Medication Sig Dispense Refill  . albuterol (PROAIR HFA) 108 (90 Base) MCG/ACT inhaler Inhale 2 puffs into the lungs every 6 (six) hours as needed for wheezing or shortness of breath. 1 Inhaler 3  . ALPRAZolam (XANAX) 0.25 MG tablet Take 1 tablet (0.25 mg total) by mouth at bedtime as needed for anxiety. 5 tablet 0  . azelastine (ASTELIN) 0.1 % nasal spray Place 2 sprays into both nostrils 2 (two) times daily as needed for allergies.     Marland Kitchen betamethasone dipropionate (DIPROLENE) 0.05 % ointment Apply 1 application topically daily as needed (psoriasis).    . Cholecalciferol (VITAMIN D) 2000 units CAPS Take 2,000 Units by mouth daily.     Marland Kitchen  dicyclomine (BENTYL) 10 MG capsule Take 1 tab by mouth every morning. May take twice daily as needed. (Patient taking differently: Take 10 mg by mouth daily. ) 60 capsule 6  . diltiazem (CARDIZEM CD) 120 MG 24 hr capsule Take 1 capsule (120 mg total) by mouth every evening. 90 capsule 3  . diltiazem (CARDIZEM CD) 180 MG 24 hr capsule Take 1 capsule (180 mg total) by mouth every morning. 90 capsule 3  . diphenhydrAMINE (BENADRYL) 25 mg capsule Take 25 mg by mouth daily as needed for itching.     . diphenoxylate-atropine (LOMOTIL) 2.5-0.025 MG tablet Take 1 tablet by mouth 4 (four) times daily. (Patient  taking differently: Take 1 tablet by mouth 4 (four) times daily as needed for diarrhea or loose stools. ) 360 tablet 1  . DULoxetine (CYMBALTA) 30 MG capsule TAKE 1 CAPSULE(30 MG) BY MOUTH DAILY AT NIGHT (Patient taking differently: Take 30 mg by mouth at bedtime. ) 30 capsule 0  . fluticasone (FLONASE) 50 MCG/ACT nasal spray Place 2 sprays into both nostrils 2 (two) times daily as needed (FOR NASAL CONGESTION.).     Marland Kitchen furosemide (LASIX) 20 MG tablet Take 1 tablet (20 mg total) by mouth daily. 90 tablet 9  . letrozole (FEMARA) 2.5 MG tablet Take 1 tablet (2.5 mg total) by mouth daily. 30 tablet 3  . levothyroxine (SYNTHROID, LEVOTHROID) 137 MCG tablet Take 137 mcg by mouth daily before breakfast. For thyroid therapy    . lidocaine-prilocaine (EMLA) cream Apply 1 application topically as needed. (Patient taking differently: Apply 1 application topically as needed (port). ) 30 g 6  . loperamide (IMODIUM A-D) 2 MG tablet Take 2-4 mg by mouth as needed for diarrhea or loose stools.    . mirtazapine (REMERON) 15 MG tablet TAKE 1 TABLET(15 MG) BY MOUTH AT BEDTIME (Patient taking differently: Take 15 mg by mouth at bedtime. ) 30 tablet 11  . morphine (MS CONTIN) 15 MG 12 hr tablet Take 1 tablet (15 mg total) by mouth every 12 (twelve) hours. 60 tablet 0  . morphine (MSIR) 15 MG tablet Take 15 mg by mouth daily as needed for severe pain.    . polyvinyl alcohol (LIQUIFILM TEARS) 1.4 % ophthalmic solution Place 1 drop into both eyes 2 (two) times daily as needed for dry eyes.     . potassium chloride SA (K-DUR,KLOR-CON) 20 MEQ tablet Take 10 mEq by mouth daily.    . rivaroxaban (XARELTO) 20 MG TABS tablet Take 20 mg by mouth daily with supper.    . Tiotropium Bromide-Olodaterol (STIOLTO RESPIMAT) 2.5-2.5 MCG/ACT AERS Inhale 2 puffs into the lungs daily. 1 Inhaler 3  . Ustekinumab (STELARA [place]) Inject 1 Dose into the skin every 3 (three) months.     No current facility-administered medications for this visit.      PHYSICAL EXAMINATION: ECOG PERFORMANCE STATUS: 1 - Symptomatic but completely ambulatory  Vitals:   06/30/18 1109  BP: 132/62  Pulse: 97  Resp: 18  Temp: 98.3 F (36.8 C)  SpO2: 94%   Filed Weights   06/30/18 1109  Weight: 168 lb 6.4 oz (76.4 kg)    GENERAL:alert, no distress and comfortable SKIN: skin color, texture, turgor are normal, no rashes or significant lesions EYES: normal, Conjunctiva are pink and non-injected, sclera clear OROPHARYNX:no exudate, no erythema and lips, buccal mucosa, and tongue normal  NECK: supple, thyroid normal size, non-tender, without nodularity LYMPH:  no palpable lymphadenopathy in the cervical, axillary or inguinal LUNGS: Normal breathing  effort.  Crackles noted on the right lung base HEART: regular rate & rhythm and no murmurs with mild bilateral lower extremity edema ABDOMEN:abdomen soft, non-tender and normal bowel sounds Musculoskeletal:no cyanosis of digits and no clubbing  NEURO: alert & oriented x 3 with fluent speech, no focal motor/sensory deficits  LABORATORY DATA:  I have reviewed the data as listed    Component Value Date/Time   NA 139 06/30/2018 1006   NA 135 (L) 09/24/2017 0945   K 3.9 06/30/2018 1006   K 3.7 09/24/2017 0945   CL 103 06/30/2018 1006   CO2 28 06/30/2018 1006   CO2 30 (H) 09/24/2017 0945   GLUCOSE 102 (H) 06/30/2018 1006   GLUCOSE 101 09/24/2017 0945   BUN 14 06/30/2018 1006   BUN 15.3 09/24/2017 0945   CREATININE 1.01 (H) 06/30/2018 1006   CREATININE 1.00 03/13/2018 0940   CREATININE 0.8 09/24/2017 0945   CALCIUM 8.2 (L) 06/30/2018 1006   CALCIUM 9.1 09/24/2017 0945   PROT 6.7 06/30/2018 1006   PROT 6.6 09/24/2017 0945   ALBUMIN 3.0 (L) 06/30/2018 1006   ALBUMIN 3.3 (L) 09/24/2017 0945   AST 14 (L) 06/30/2018 1006   AST 14 03/13/2018 0940   AST 16 09/24/2017 0945   ALT 6 06/30/2018 1006   ALT 7 03/13/2018 0940   ALT 12 09/24/2017 0945   ALKPHOS 59 06/30/2018 1006   ALKPHOS 52 09/24/2017  0945   BILITOT 0.3 06/30/2018 1006   BILITOT 0.3 03/13/2018 0940   BILITOT 1.13 09/24/2017 0945   GFRNONAA 50 (L) 06/30/2018 1006   GFRNONAA 51 (L) 03/13/2018 0940   GFRAA 58 (L) 06/30/2018 1006   GFRAA 59 (L) 03/13/2018 0940    No results found for: SPEP, UPEP  Lab Results  Component Value Date   WBC 8.1 06/30/2018   NEUTROABS 5.2 06/30/2018   HGB 11.3 (L) 06/30/2018   HCT 35.2 06/30/2018   MCV 90.3 06/30/2018   PLT 198 06/30/2018      Chemistry      Component Value Date/Time   NA 139 06/30/2018 1006   NA 135 (L) 09/24/2017 0945   K 3.9 06/30/2018 1006   K 3.7 09/24/2017 0945   CL 103 06/30/2018 1006   CO2 28 06/30/2018 1006   CO2 30 (H) 09/24/2017 0945   BUN 14 06/30/2018 1006   BUN 15.3 09/24/2017 0945   CREATININE 1.01 (H) 06/30/2018 1006   CREATININE 1.00 03/13/2018 0940   CREATININE 0.8 09/24/2017 0945      Component Value Date/Time   CALCIUM 8.2 (L) 06/30/2018 1006   CALCIUM 9.1 09/24/2017 0945   ALKPHOS 59 06/30/2018 1006   ALKPHOS 52 09/24/2017 0945   AST 14 (L) 06/30/2018 1006   AST 14 03/13/2018 0940   AST 16 09/24/2017 0945   ALT 6 06/30/2018 1006   ALT 7 03/13/2018 0940   ALT 12 09/24/2017 0945   BILITOT 0.3 06/30/2018 1006   BILITOT 0.3 03/13/2018 0940   BILITOT 1.13 09/24/2017 0945       RADIOGRAPHIC STUDIES: I have personally reviewed the radiological images as listed and agreed with the findings in the report. Dg Chest 2 View  Result Date: 06/24/2018 CLINICAL DATA:  Preoperative evaluation for right-sided chest tube for fluid. EXAM: CHEST - 2 VIEW COMPARISON:  Prior radiograph from 06/17/2018. FINDINGS: Cardiac and mediastinal silhouettes are stable in size and contour, and remain within normal limits. Aortic atherosclerosis. Right-sided Port-A-Cath in place with tip overlying the cavoatrial junction. Blunting of  the right costophrenic angle suggestive of a small right pleural effusion, slightly decreased from previous. Mild bibasilar  subsegmental atelectasis. No focal infiltrates. No pneumothorax. No pulmonary edema. No acute osseous abnormality. Multilevel spurring noted within the visualized spine. IMPRESSION: 1. Mild blunting of the right costophrenic angle, suggesting small right pleural effusion, slightly decreased relative to 06/17/2018. 2. Mild bibasilar subsegmental atelectasis. 3. Aortic atherosclerosis. Electronically Signed   By: Jeannine Boga M.D.   On: 06/24/2018 06:17   Dg Chest 2 View  Result Date: 06/17/2018 CLINICAL DATA:  History of ovarian carcinoma.  Pleural effusion. EXAM: CHEST - 2 VIEW COMPARISON:  June 03, 2018 FINDINGS: Port-A-Cath tip is in the superior vena cava. No pneumothorax. There is a fairly small right pleural effusion with right base atelectasis. Lungs elsewhere are clear. Heart size and pulmonary vascularity are normal. No adenopathy. There is aortic atherosclerosis. There is degenerative change in the thoracic spine. No blastic or lytic bone lesions are evident. IMPRESSION: Small right pleural effusion with right base atelectasis. No edema or consolidation. No adenopathy evident. There is aortic atherosclerosis. Aortic Atherosclerosis (ICD10-I70.0). Electronically Signed   By: Lowella Grip III M.D.   On: 06/17/2018 15:53   Dg Chest 2 View  Result Date: 06/03/2018 CLINICAL DATA:  History of right pleural effusion, shortness of breath, cough EXAM: CHEST - 2 VIEW COMPARISON:  Portable chest x-ray of 05/08/2018 FINDINGS: The lungs are slightly hyperaerated. There do appear to be small pleural effusions present with mild volume loss at the lung bases. No pneumonia is seen. Mediastinal and hilar contours are unchanged and the heart is within normal limits in size. There are degenerative changes in the mid to lower thoracic spine. Port-A-Cath is present with the tip overlying the lower SVC. IMPRESSION: 1. Suspect small pleural effusions with mild basilar atelectasis. 2. Hyperaeration.  No  pneumonia. Electronically Signed   By: Ivar Drape M.D.   On: 06/03/2018 17:06   Dg Chest Port 1 View  Result Date: 06/24/2018 CLINICAL DATA:  Right-sided PleurX insertion EXAM: PORTABLE CHEST 1 VIEW COMPARISON:  Earlier today FINDINGS: New right-sided tunneled pleural catheter with tip at the apex. There is trace apical pneumothorax. No pleural fluid is seen. No new lung opacity. Mild atelectasis at the left base. Normal heart size. Porta catheter on the right with tip at the SVC. IMPRESSION: 1. New tunneled pleural catheter on the right with tip at the apex. 2. Trace right apical pneumothorax. Electronically Signed   By: Monte Fantasia M.D.   On: 06/24/2018 09:24   Dg C-arm 1-60 Min-no Report  Result Date: 06/24/2018 Fluoroscopy was utilized by the requesting physician.  No radiographic interpretation.    All questions were answered. The patient knows to call the clinic with any problems, questions or concerns. No barriers to learning was detected.  I spent 15 minutes counseling the patient face to face. The total time spent in the appointment was 20 minutes and more than 50% was on counseling and review of test results  Heath Lark, MD 07/01/2018 8:02 AM

## 2018-07-01 NOTE — Assessment & Plan Note (Signed)
Clinically, she is not symptomatic She tolerated antiestrogen therapy well I will continue see her on a monthly basis with close monitoring of tumor marker I plan to repeat imaging study in October for objective assessment of response to therapy

## 2018-07-01 NOTE — Assessment & Plan Note (Signed)
She underwent recent Pleurx catheter placement and tolerated the procedure well I will defer to CT surgery for further management

## 2018-07-01 NOTE — Assessment & Plan Note (Signed)
She has leg edema and low protein status likely due to recurrent thoracentesis We discussed protein rich diet

## 2018-07-01 NOTE — Telephone Encounter (Signed)
Called and given below message. She verbalized understanding. 

## 2018-07-08 DIAGNOSIS — M25559 Pain in unspecified hip: Secondary | ICD-10-CM

## 2018-07-08 HISTORY — DX: Pain in unspecified hip: M25.559

## 2018-07-14 ENCOUNTER — Telehealth: Payer: Self-pay

## 2018-07-14 NOTE — Telephone Encounter (Signed)
Daughter called and left a message. [name] saw her Rheumatologist one week ago and he increased the Cymbalta to 60 mg at night. Since the increase her pain is better. She has not taken any morphine in 3 days. Daughter is now worried about her Mom having withdrawal symptoms.

## 2018-07-14 NOTE — Telephone Encounter (Signed)
Called and given below message. Daughter verbalized understanding. 

## 2018-07-14 NOTE — Telephone Encounter (Signed)
She can get her mom to take 1/2 of morphine if her mom has withdrawal symptoms

## 2018-07-16 ENCOUNTER — Ambulatory Visit
Admission: RE | Admit: 2018-07-16 | Discharge: 2018-07-16 | Disposition: A | Discharge status: Home / Self Care | Payer: Medicare Other | Source: Ambulatory Visit | Attending: Cardiothoracic Surgery | Admitting: Cardiothoracic Surgery

## 2018-07-16 ENCOUNTER — Other Ambulatory Visit: Payer: Self-pay | Admitting: Cardiothoracic Surgery

## 2018-07-16 ENCOUNTER — Ambulatory Visit (INDEPENDENT_AMBULATORY_CARE_PROVIDER_SITE_OTHER): Payer: Medicare Other | Admitting: Cardiothoracic Surgery

## 2018-07-16 ENCOUNTER — Other Ambulatory Visit: Payer: Self-pay

## 2018-07-16 ENCOUNTER — Encounter: Payer: Self-pay | Admitting: Cardiothoracic Surgery

## 2018-07-16 ENCOUNTER — Telehealth: Payer: Self-pay

## 2018-07-16 VITALS — BP 110/60 | HR 90 | Resp 18 | Ht 64.0 in | Wt 159.7 lb

## 2018-07-16 DIAGNOSIS — J90 Pleural effusion, not elsewhere classified: Secondary | ICD-10-CM | POA: Diagnosis not present

## 2018-07-16 DIAGNOSIS — Z9889 Other specified postprocedural states: Secondary | ICD-10-CM

## 2018-07-16 NOTE — Progress Notes (Signed)
[address]       ,[place] [address]             [phone]      [name] [facility] Medical Record #[id number] Date of Birth: 11/16/1932  Referring: Collene Gobble, MD Primary Care: Asencion Noble, MD Primary Cardiologist: Rozann Lesches, MD   Chief Complaint:   POST OP FOLLOW UP 06/24/2018 PREOPERATIVE DIAGNOSIS:  Recurrent right pleural effusion with history of ovarian carcinoma. POSTOPERATIVE DIAGNOSIS:  Recurrent right pleural effusion with history of ovarian carcinoma. SURGICAL PROCEDURE:  Placement of right PleurX catheter with ultrasound and fluoroscopic guidance  History of Present Illness:     Patient returns office today with a follow-up chest x-ray.  She notes that her overall breathlessness has significantly improved since placement of Pleurx catheter.  The drainage has decreased to between 50 mL 100 mL every other day.  Drainage today was 50 mL     Past Medical History:  Diagnosis Date  . Anxiety   . Chronic blood loss anemia    03-04-2018 diverticular bleed and rectal bleeding,  transfused 2 units PRBCs 03-08-2018  . Colitis   . COPD (chronic obstructive pulmonary disease) ([place])    pulmologist-  dr byrum  . Depression   . Diastolic CHF, chronic ([place])    followed by cardiology  . Diverticulosis   . Dyspnea on effort   . Dysrhythmia    a-fib  . Family history of colon cancer   . Family hx of colon cancer   . Fibromyalgia   . Genetic testing 04/07/2018   MyRisk (35 genes) @ [facility] - No pathogenic mutations detected  . GERD (gastroesophageal reflux disease)    03-17-2018 per pt has not had any gerd issues in long time and no meds prn  . Hemorrhoids   . Hiatal hernia   . History of lower GI bleeding 03/04/2018   admission--  dx diverticular bleed and rectal bleed in setting of xarelto-- xarelto stopped and transfused 03-08-2018 2 units PRBCs  . History of rectal polyps   . History of shingles   . Hypothyroidism   . IBS  (irritable bowel syndrome)   . Lymphocytic colitis    FOLLOWED BY DR Henrene Pastor  . Malignant ascites    dx at admission 09/ 2018 abdominal s/p  parencentesis 07-01-2017 2.5L,  07-08-2016  2.7L,  07-12-2017  147m  . Neuropathy due to chemotherapeutic drug ([facility]   . Osteoporosis   . Ovarian cancer ([facility] oncologist-  [place]/  dr pGerarda Fraction  chemotherapy 07-19-2017 to 11-04-2017  . Paroxysmal atrial fibrillation ([facility]    CARDIOLOGIST-  DR S. MCDOWELL-  first dx'd 07/ 2014---was taking xarelto up until 03-07-2018 stopped due to lower GI bleed  . Pleural effusion    s/p  right thoracentesis, 02-2018 1.3L and 03-17-2018 right thoracentesis 6469m, post cxr no residual effusion  . [place]soriatic arthritis ([facility]  . Schatzki's ring    S/P  DILATERAL 2013  . Seasonal allergic rhinitis      Social History   Tobacco Use  Smoking Status Former Smoker  . Packs/day: 1.00  . Years: 20.00  . Pack years: 20.00  . Types: Cigarettes  . Last attempt to quit: 10/08/1980  . Years since quitting: 37.7  Smokeless Tobacco Never Used    Social History   Substance and Sexual Activity  Alcohol Use Yes   Comment: rarely, 12-23-15 rarely     Allergies  Allergen Reactions  .  Codeine Itching    Current Outpatient Medications  Medication Sig Dispense Refill  . albuterol (PROAIR HFA) 108 (90 Base) MCG/ACT inhaler Inhale 2 puffs into the lungs every 6 (six) hours as needed for wheezing or shortness of breath. 1 Inhaler 3  . ALPRAZolam (XANAX) 0.25 MG tablet Take 1 tablet (0.25 mg total) by mouth at bedtime as needed for anxiety. 5 tablet 0  . azelastine (ASTELIN) 0.1 % nasal spray Place 2 sprays into both nostrils 2 (two) times daily as needed for allergies.     Marland Kitchen betamethasone dipropionate (DIPROLENE) 0.05 % ointment Apply 1 application topically daily as needed (psoriasis).    . Cholecalciferol (VITAMIN D) 2000 units CAPS Take 2,000 Units by mouth daily.     Marland Kitchen dicyclomine (BENTYL) 10 MG capsule Take 1 tab by  mouth every morning. May take twice daily as needed. (Patient taking differently: Take 10 mg by mouth daily. ) 60 capsule 6  . diphenhydrAMINE (BENADRYL) 25 mg capsule Take 25 mg by mouth daily as needed for itching.     . diphenoxylate-atropine (LOMOTIL) 2.5-0.025 MG tablet Take 1 tablet by mouth 4 (four) times daily. (Patient taking differently: Take 1 tablet by mouth 4 (four) times daily as needed for diarrhea or loose stools. ) 360 tablet 1  . DULoxetine (CYMBALTA) 30 MG capsule Take 1 capsule (30 mg total) by mouth at bedtime. (Patient taking differently: Take 60 mg by mouth at bedtime. ) 90 capsule 9  . fluticasone (FLONASE) 50 MCG/ACT nasal spray Place 2 sprays into both nostrils 2 (two) times daily as needed (FOR NASAL CONGESTION.).     Marland Kitchen furosemide (LASIX) 20 MG tablet Take 1 tablet (20 mg total) by mouth daily. 90 tablet 9  . letrozole (FEMARA) 2.5 MG tablet Take 1 tablet (2.5 mg total) by mouth daily. 30 tablet 3  . levothyroxine (SYNTHROID, LEVOTHROID) 137 MCG tablet Take 137 mcg by mouth daily before breakfast. For thyroid therapy    . lidocaine-prilocaine (EMLA) cream Apply 1 application topically as needed. (Patient taking differently: Apply 1 application topically as needed (port). ) 30 g 6  . loperamide (IMODIUM A-D) 2 MG tablet Take 2-4 mg by mouth as needed for diarrhea or loose stools.    . mirtazapine (REMERON) 15 MG tablet TAKE 1 TABLET(15 MG) BY MOUTH AT BEDTIME (Patient taking differently: Take 15 mg by mouth at bedtime. ) 30 tablet 11  . morphine (MS CONTIN) 15 MG 12 hr tablet Take 1 tablet (15 mg total) by mouth every 12 (twelve) hours. 60 tablet 0  . morphine (MSIR) 15 MG tablet Take 15 mg by mouth daily as needed for severe pain.    . polyvinyl alcohol (LIQUIFILM TEARS) 1.4 % ophthalmic solution Place 1 drop into both eyes 2 (two) times daily as needed for dry eyes.     . potassium chloride SA (K-DUR,KLOR-CON) 20 MEQ tablet Take 10 mEq by mouth daily.    . rivaroxaban  (XARELTO) 20 MG TABS tablet Take 20 mg by mouth daily with supper.    . Tiotropium Bromide-Olodaterol (STIOLTO RESPIMAT) 2.5-2.5 MCG/ACT AERS Inhale 2 puffs into the lungs daily. 1 Inhaler 3  . Ustekinumab (STELARA [place]) Inject 1 Dose into the skin every 3 (three) months.    . diltiazem (CARDIZEM CD) 120 MG 24 hr capsule Take 1 capsule (120 mg total) by mouth every evening. 90 capsule 3  . diltiazem (CARDIZEM CD) 180 MG 24 hr capsule Take 1 capsule (180 mg total) by mouth every  morning. 90 capsule 3   No current facility-administered medications for this visit.        Physical Exam: BP 110/60 (BP Location: Left Arm, Patient Position: Sitting, Cuff Size: Normal)   Pulse 90   Resp 18   Ht 5' 4"  (1.626 m)   Wt 159 lb 11.2 oz (72.4 kg)   SpO2 95% Comment: RA  BMI 27.41 kg/m   General appearance: alert, cooperative and appears stated age Neurologic: intact Heart: regular rate and rhythm, S1, S2 normal, no murmur, click, rub or gallop Lungs: clear to auscultation bilaterally Abdomen: soft, non-tender; bowel sounds normal; no masses,  no organomegaly Extremities: extremities normal, atraumatic, no cyanosis or edema and Homans sign is negative, no sign of DVT Wound: The Pleurx catheter site is well-healed, cuff is sealed, no evidence of infection around the tube suture was removed   Diagnostic Studies & Laboratory data:     Recent Radiology Findings:   Dg Chest 2 View  Result Date: 07/16/2018 CLINICAL DATA:  Status post prior right thoracentesis, ovarian cancer EXAM: CHEST - 2 VIEW COMPARISON:  06/24/2018 chest radiograph. FINDINGS: Right internal jugular MediPort terminates at the cavoatrial junction. Right chest tube with tip in the medial upper right pleural space. Stable cardiomediastinal silhouette with normal heart size. Stable tiny right apical pneumothorax. No left pneumothorax. Trace bilateral pleural effusions. No pulmonary edema. Mild bibasilar scarring versus atelectasis.  IMPRESSION: 1. Stable tiny right apical pneumothorax with right chest tube in place. 2. Trace bilateral pleural effusions with mild bibasilar scarring versus atelectasis. Electronically Signed   By: Ilona Sorrel M.D.   On: 07/16/2018 15:26    I have independently reviewed the above radiology studies  and reviewed the findings with the patient.    Recent Lab Findings: Lab Results  Component Value Date   WBC 8.1 06/30/2018   HGB 11.3 (L) 06/30/2018   HCT 35.2 06/30/2018   PLT 198 06/30/2018   GLUCOSE 102 (H) 06/30/2018   CHOL 147 11/14/2017   ALT 6 06/30/2018   AST 14 (L) 06/30/2018   NA 139 06/30/2018   K 3.9 06/30/2018   CL 103 06/30/2018   CREATININE 1.01 (H) 06/30/2018   BUN 14 06/30/2018   CO2 28 06/30/2018   TSH 0.388 11/10/2017   INR 1.09 06/24/2018   HGBA1C 5.7 (H) 06/12/2013      Assessment / Plan:       Patient doing well following placement of right Pleurx catheter, symptomatic much improved, will transition to every 3-day drainage and follow the amount. Plan to see her back in 1 month with a follow-up chest x-ray She has follow-up appointment with oncology and a CT scan in the next week or so    Grace Isaac MD      [address] [place] [address] Office [phone]   Beeper [phone]  07/16/2018 3:47 PM

## 2018-07-16 NOTE — Telephone Encounter (Signed)
Nurse, Levada Dy with [facility] called at [phone], left a message, and given Verbal orders to drain [name]'s PleruX every third day and to give the office a call when she has had 3 consecutive drains less than 50 ml's.  I also let her know that she has been drained in the office today and she will not need to be drained tomorrow because of this.  Will await return call it warranted.

## 2018-07-17 ENCOUNTER — Ambulatory Visit: Payer: Self-pay | Admitting: Cardiothoracic Surgery

## 2018-07-18 ENCOUNTER — Telehealth: Payer: Self-pay | Admitting: Emergency Medicine

## 2018-07-18 DIAGNOSIS — J449 Chronic obstructive pulmonary disease, unspecified: Secondary | ICD-10-CM

## 2018-07-18 NOTE — Telephone Encounter (Signed)
Called and spoke with [name]  Pulmonary [facility]bab needs PFT to be completed since she has medicare A and B  She says it can just be a pre and post spiro  RB- are you okay with ordering this? Do you want just the pre and post or a full? Please advise thanks

## 2018-07-21 ENCOUNTER — Telehealth: Payer: Self-pay | Admitting: Physician Assistant

## 2018-07-21 NOTE — Telephone Encounter (Signed)
Called and spoke with pt's daughter [name] stating to her it would be okay for pt to have PFT and if pt was having any discomfort, we could have her stop.  [name] expressed understanding. Pt has been scheduled for PFT 10/17 at 10:30 prior to [place] with RB.  Nothing further needed.

## 2018-07-21 NOTE — Telephone Encounter (Signed)
Yes Ok to order pre / post-BD spiro

## 2018-07-21 NOTE — Telephone Encounter (Signed)
Got a refill request for Lomotil, originally saw Dr. Henrene Pastor, 08-21-2018.  He prescribed Lomitil.  Saw Amy Esterwood PA on 03-12-2018.  Started patient on Budesonide.  Should we refill the Lomotil?

## 2018-07-21 NOTE — Telephone Encounter (Signed)
Called and spoke with pt's daughter [name] to see if I could get pt scheduled for a PFT prior to the [place] with [place] 10/17 at 11:30  Stated to her we could schedule pt to have PFT at 10:30 followed by the OV at 11:30  Per [name], pt still has a pleur-X cath drain in place and wants to know if it will be okay for pt to have the PFT performed with the drain still in place.  Dr. Lamonte Sakai, please advise on this for pt and pt's daughter [name]. Thanks!

## 2018-07-21 NOTE — Telephone Encounter (Signed)
Patient requesting a refill of medication lomotil sent to [facility]. Pt last seen 6.5.19.

## 2018-07-21 NOTE — Telephone Encounter (Signed)
Yes this should be OK, as long as it doesn't cause the patient any discomfort. If it does we'll stop the test

## 2018-07-22 ENCOUNTER — Inpatient Hospital Stay: Payer: Medicare Other

## 2018-07-22 ENCOUNTER — Other Ambulatory Visit: Payer: Self-pay | Admitting: Emergency Medicine

## 2018-07-22 ENCOUNTER — Encounter (HOSPITAL_COMMUNITY): Payer: Self-pay

## 2018-07-22 ENCOUNTER — Inpatient Hospital Stay: Payer: Medicare Other | Attending: Hematology and Oncology

## 2018-07-22 ENCOUNTER — Ambulatory Visit (HOSPITAL_COMMUNITY)
Admission: RE | Admit: 2018-07-22 | Discharge: 2018-07-22 | Disposition: A | Discharge status: Home / Self Care | Payer: Medicare Other | Source: Ambulatory Visit | Attending: Hematology and Oncology | Admitting: Hematology and Oncology

## 2018-07-22 DIAGNOSIS — Z9221 Personal history of antineoplastic chemotherapy: Secondary | ICD-10-CM | POA: Diagnosis not present

## 2018-07-22 DIAGNOSIS — Z90710 Acquired absence of both cervix and uterus: Secondary | ICD-10-CM | POA: Diagnosis not present

## 2018-07-22 DIAGNOSIS — T451X5A Adverse effect of antineoplastic and immunosuppressive drugs, initial encounter: Secondary | ICD-10-CM | POA: Diagnosis not present

## 2018-07-22 DIAGNOSIS — Z9049 Acquired absence of other specified parts of digestive tract: Secondary | ICD-10-CM | POA: Diagnosis not present

## 2018-07-22 DIAGNOSIS — R188 Other ascites: Secondary | ICD-10-CM | POA: Insufficient documentation

## 2018-07-22 DIAGNOSIS — Z7901 Long term (current) use of anticoagulants: Secondary | ICD-10-CM | POA: Insufficient documentation

## 2018-07-22 DIAGNOSIS — Z90722 Acquired absence of ovaries, bilateral: Secondary | ICD-10-CM | POA: Insufficient documentation

## 2018-07-22 DIAGNOSIS — J90 Pleural effusion, not elsewhere classified: Secondary | ICD-10-CM | POA: Diagnosis not present

## 2018-07-22 DIAGNOSIS — C562 Malignant neoplasm of left ovary: Secondary | ICD-10-CM | POA: Insufficient documentation

## 2018-07-22 DIAGNOSIS — Z79899 Other long term (current) drug therapy: Secondary | ICD-10-CM | POA: Insufficient documentation

## 2018-07-22 DIAGNOSIS — C786 Secondary malignant neoplasm of retroperitoneum and peritoneum: Secondary | ICD-10-CM | POA: Diagnosis not present

## 2018-07-22 DIAGNOSIS — R971 Elevated cancer antigen 125 [CA 125]: Secondary | ICD-10-CM | POA: Diagnosis not present

## 2018-07-22 DIAGNOSIS — I700 Atherosclerosis of aorta: Secondary | ICD-10-CM | POA: Insufficient documentation

## 2018-07-22 DIAGNOSIS — K5730 Diverticulosis of large intestine without perforation or abscess without bleeding: Secondary | ICD-10-CM | POA: Insufficient documentation

## 2018-07-22 DIAGNOSIS — G620 Drug-induced polyneuropathy: Secondary | ICD-10-CM | POA: Insufficient documentation

## 2018-07-22 DIAGNOSIS — N83201 Unspecified ovarian cyst, right side: Secondary | ICD-10-CM | POA: Diagnosis not present

## 2018-07-22 LAB — COMPREHENSIVE METABOLIC PANEL
ALK PHOS: 55 U/L (ref 38–126)
ALT: 10 U/L (ref 0–44)
ANION GAP: 7 (ref 5–15)
AST: 17 U/L (ref 15–41)
Albumin: 3.3 g/dL — ABNORMAL LOW (ref 3.5–5.0)
BILIRUBIN TOTAL: 0.4 mg/dL (ref 0.3–1.2)
BUN: 14 mg/dL (ref 8–23)
CO2: 25 mmol/L (ref 22–32)
Calcium: 8.7 mg/dL — ABNORMAL LOW (ref 8.9–10.3)
Chloride: 104 mmol/L (ref 98–111)
Creatinine, Ser: 0.95 mg/dL (ref 0.44–1.00)
GFR calc Af Amer: >60 mL/min (ref >60)
GFR calc non Af Amer: 54 mL/min — ABNORMAL LOW (ref >60)
GLUCOSE: 119 mg/dL — AB (ref 70–99)
Potassium: 4.2 mmol/L (ref 3.5–5.1)
Sodium: 136 mmol/L (ref 135–145)
TOTAL PROTEIN: 7.4 g/dL (ref 6.5–8.1)

## 2018-07-22 LAB — CBC WITH DIFFERENTIAL/PLATELET
Abs Immature Granulocytes: 0.13 10*3/uL — ABNORMAL HIGH (ref 0.00–0.07)
Basophils Absolute: 0.1 10*3/uL (ref 0.0–0.1)
Basophils Relative: 1 %
EOS PCT: 4 %
Eosinophils Absolute: 0.4 10*3/uL (ref 0.0–0.5)
HCT: 35.9 % — ABNORMAL LOW (ref 36.0–46.0)
HEMOGLOBIN: 11.4 g/dL — AB (ref 12.0–15.0)
Immature Granulocytes: 1 %
LYMPHS ABS: 1.8 10*3/uL (ref 0.7–4.0)
LYMPHS PCT: 19 %
MCH: 27.9 pg (ref 26.0–34.0)
MCHC: 31.8 g/dL (ref 30.0–36.0)
MCV: 88 fL (ref 80.0–100.0)
Monocytes Absolute: 0.9 10*3/uL (ref 0.1–1.0)
Monocytes Relative: 9 %
Neutro Abs: 6.2 10*3/uL (ref 1.7–7.7)
Neutrophils Relative %: 66 %
Platelets: 241 10*3/uL (ref 150–400)
RBC: 4.08 MIL/uL (ref 3.87–5.11)
RDW: 13.4 % (ref 11.5–15.5)
WBC: 9.4 10*3/uL (ref 4.0–10.5)
nRBC: 0 % (ref 0.0–0.2)

## 2018-07-22 MED ORDER — SODIUM CHLORIDE 0.9% FLUSH
10.0000 mL | Freq: Once | INTRAVENOUS | Status: AC
  Administered 2018-07-22: 10 mL
  Filled 2018-07-22: qty 10

## 2018-07-22 MED ORDER — SODIUM CHLORIDE 0.9 % IJ SOLN
INTRAMUSCULAR | Status: AC
  Filled 2018-07-22: qty 50

## 2018-07-22 MED ORDER — IOHEXOL 300 MG/ML  SOLN
100.0000 mL | Freq: Once | INTRAMUSCULAR | Status: AC | PRN
  Administered 2018-07-22: 100 mL via INTRAVENOUS

## 2018-07-22 MED ORDER — HEPARIN SOD (PORK) LOCK FLUSH 100 UNIT/ML IV SOLN
INTRAVENOUS | Status: AC
  Filled 2018-07-22: qty 5

## 2018-07-23 LAB — CA 125: Cancer Antigen (CA) 125: 215 U/mL — ABNORMAL HIGH (ref 0.0–38.1)

## 2018-07-24 ENCOUNTER — Ambulatory Visit (INDEPENDENT_AMBULATORY_CARE_PROVIDER_SITE_OTHER): Payer: Medicare Other | Admitting: Emergency Medicine

## 2018-07-24 ENCOUNTER — Telehealth: Payer: Self-pay | Admitting: *Deleted

## 2018-07-24 ENCOUNTER — Inpatient Hospital Stay (HOSPITAL_BASED_OUTPATIENT_CLINIC_OR_DEPARTMENT_OTHER): Payer: Medicare Other | Admitting: Hematology and Oncology

## 2018-07-24 ENCOUNTER — Encounter: Payer: Self-pay | Admitting: Hematology and Oncology

## 2018-07-24 ENCOUNTER — Telehealth: Payer: Self-pay | Admitting: Hematology and Oncology

## 2018-07-24 ENCOUNTER — Encounter: Payer: Self-pay | Admitting: Emergency Medicine

## 2018-07-24 VITALS — BP 117/42 | HR 97 | Temp 98.0°F | Resp 18 | Ht 64.0 in

## 2018-07-24 DIAGNOSIS — J449 Chronic obstructive pulmonary disease, unspecified: Secondary | ICD-10-CM | POA: Diagnosis not present

## 2018-07-24 DIAGNOSIS — R971 Elevated cancer antigen 125 [CA 125]: Secondary | ICD-10-CM

## 2018-07-24 DIAGNOSIS — C786 Secondary malignant neoplasm of retroperitoneum and peritoneum: Secondary | ICD-10-CM | POA: Diagnosis not present

## 2018-07-24 DIAGNOSIS — J90 Pleural effusion, not elsewhere classified: Secondary | ICD-10-CM

## 2018-07-24 DIAGNOSIS — Z90710 Acquired absence of both cervix and uterus: Secondary | ICD-10-CM

## 2018-07-24 DIAGNOSIS — Z90722 Acquired absence of ovaries, bilateral: Secondary | ICD-10-CM

## 2018-07-24 DIAGNOSIS — T451X5A Adverse effect of antineoplastic and immunosuppressive drugs, initial encounter: Secondary | ICD-10-CM

## 2018-07-24 DIAGNOSIS — Z79899 Other long term (current) drug therapy: Secondary | ICD-10-CM

## 2018-07-24 DIAGNOSIS — Z9221 Personal history of antineoplastic chemotherapy: Secondary | ICD-10-CM

## 2018-07-24 DIAGNOSIS — C562 Malignant neoplasm of left ovary: Secondary | ICD-10-CM | POA: Diagnosis not present

## 2018-07-24 DIAGNOSIS — M129 Arthropathy, unspecified: Secondary | ICD-10-CM

## 2018-07-24 DIAGNOSIS — N83201 Unspecified ovarian cyst, right side: Secondary | ICD-10-CM

## 2018-07-24 DIAGNOSIS — Z9049 Acquired absence of other specified parts of digestive tract: Secondary | ICD-10-CM

## 2018-07-24 DIAGNOSIS — G620 Drug-induced polyneuropathy: Secondary | ICD-10-CM | POA: Diagnosis not present

## 2018-07-24 DIAGNOSIS — Z7901 Long term (current) use of anticoagulants: Secondary | ICD-10-CM

## 2018-07-24 DIAGNOSIS — R188 Other ascites: Secondary | ICD-10-CM

## 2018-07-24 DIAGNOSIS — I700 Atherosclerosis of aorta: Secondary | ICD-10-CM

## 2018-07-24 LAB — PULMONARY FUNCTION TEST
FEF 25-75 POST: 0.43 L/s
FEF 25-75 Pre: 0.39 L/sec
FEF2575-%Change-Post: 10 %
FEF2575-%PRED-POST: 34 %
FEF2575-%PRED-PRE: 31 %
FEV1-%CHANGE-POST: 2 %
FEV1-%PRED-PRE: 51 %
FEV1-%Pred-Post: 52 %
FEV1-POST: 0.96 L
FEV1-Pre: 0.94 L
FEV1FVC-%Change-Post: 1 %
FEV1FVC-%PRED-PRE: 72 %
FEV6-%Change-Post: 0 %
FEV6-%PRED-PRE: 74 %
FEV6-%Pred-Post: 74 %
FEV6-POST: 1.74 L
FEV6-Pre: 1.74 L
FEV6FVC-%CHANGE-POST: -1 %
FEV6FVC-%Pred-Post: 102 %
FEV6FVC-%Pred-Pre: 104 %
FVC-%Change-Post: 1 %
FVC-%PRED-PRE: 71 %
FVC-%Pred-Post: 72 %
FVC-POST: 1.8 L
FVC-PRE: 1.77 L
POST FEV1/FVC RATIO: 54 %
Post FEV6/FVC ratio: 97 %
Pre FEV1/FVC ratio: 53 %
Pre FEV6/FVC Ratio: 98 %

## 2018-07-24 MED ORDER — OXYCODONE HCL 10 MG PO TABS: 10.0000 mg | ORAL_TABLET | Freq: Four times a day (QID) | ORAL | 0 refills | Status: DC | PRN

## 2018-07-24 MED ORDER — DIPHENOXYLATE-ATROPINE 2.5-0.025 MG PO TABS: 1.0000 | ORAL_TABLET | Freq: Four times a day (QID) | ORAL | 4 refills | Status: DC | PRN

## 2018-07-24 NOTE — Progress Notes (Signed)
Patient had pre and post spiro only today per recent phone note request.

## 2018-07-24 NOTE — Assessment & Plan Note (Signed)
She has persistent pleural effusion I will defer to cardiothoracic surgeon for further management

## 2018-07-24 NOTE — Progress Notes (Signed)
Subjective:    Patient ID: [name], female    DOB: [birth year], 84 y.o.   MRN: [id number] HPI       ROV 03/31/18 --follow-up visit for 84 year old woman with a history of COPD, psoriatic arthritis (previous methotrexate), chronic rhinitis and chronic cough.  She also has a history of ovarian cancer for which she is undergoing treatment, hypertension, atrial fibrillation with diastolic dysfunction.  We have followed imaging and thoracentesis cytology for pleural effusion.  She underwent a right thoracentesis  on 03/17/2018, cytology was negative for any evidence of her ovarian cancer. She underwent TAH, and abdominal resection on 6/13 - went well, she is recovering well. She is on Symbicort, rare albuterol use. She is set for a repeat CXR today.  Not on chemo right now, planning to start antihormonal therapy soon.   ROV 06/03/18 --follow-up visit.  The patient is 84 with COPD, psoriatic arthritis on immunosuppressive therapy, hypertension, atrial fibrillation with diastolic dysfunction, chronic rhinitis, chronic cough.  She has ovarian cancer status post TAH and currently on letrazole, surveillance CT scan planned for October.  She has history of thoracentesis for a recurrent right pleural effusion, last was 05/08/18, cytology negative. Temporary breathing relief. Now her dyspnea and cough have returned.  Remains on stiolto.   She is having generalized pain, worse since I last saw her.         ROV 07/24/18 --84 year old woman with a history of COPD, psoriatic arthritis (previous methotrexate), chronic rhinitis and chronic cough, hypertension, atrial fibrillation with diastolic dysfunction.  She has ovarian cancer and is undergoing treatment.  She has a recurrent right pleural effusion (last cytology -05/08/2018).  A Pleurx catheter was placed by Dr. Servando Snare 06/24/2018, cytology again negative.  Draining it about every 3 days, last was 100cc. Her breathing is much improved.   Currently managed on Stiolto.   She is an albuterol rarely.     Remains on letrazole       She is planning to go to pulm rehab.  She had spirometry today, shows severe obstruction without BD response.            Objective:   Physical Exam Vitals:   07/24/18 1143  BP: 116/72  Pulse: 84  SpO2: 96%  Weight: 74.8 kg  Height: 5' 4"  (1.626 m)   Gen: Pleasant, overwt, in no distress,  normal affect  ENT: No lesions,  mouth clear,  oropharynx clear, no postnasal drip  Neck: No JVD, no stridor  Lungs: No use of accessory muscles, no wheezes, decreased at R base - improved compared with last exam  Cardiovascular: RRR, heart sounds normal, no murmur or gallops, no peripheral edema  Musculoskeletal: No deformities, no cyanosis or clubbing  Neuro: alert, non focal  Skin: Warm, no lesions or rashes   Assessment & Plan:  COPD (chronic obstructive pulmonary disease) ([facility]) Continue Stiolto 2 puffs once daily as you have been taking it. Keep albuterol available to use 2 puffs up to every 4 hours if you were to needed for shortness of breath, wheezing, chest tightness. Flu shot up-to-date. Agree with starting pulmonary rehabilitation after you finish physical therapy.  Your pulmonary function testing today is consistent with COPD.  We can provide this data to the pulmonary rehab office.  If you need a new referral let us know and we will make it. Follow with Dr Lamonte Sakai in 6 months or sooner if you have any problems  Pleural effusion Cytology remains negative.  Agree with continuing  to drain your Pleurx catheter every third day.  The frequency may decrease if the amount of fluid is decreasing.  At some point we could even consider having the tube out if there is no more fluid accumulating.  We will discuss with Dr. Alvy Bimler and Dr. Servando Snare going forward.   Baltazar Apo, MD, PhD 07/24/2018, 12:05 PM [facility] [phone] or if no answer [phone]

## 2018-07-24 NOTE — Progress Notes (Signed)
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[name]) I have review her imaging study extensively I am not convinced about mild nodularity seen on her peritoneum The elevated CA-125 could be related to presence of pleural drain for her pleural effusion She is not symptomatic For now, I recommend we continue on antiestrogen therapy Plan to recheck her blood work and CT imaging study again in 2 months and she agreed to proceed  Pleural effusion She has persistent pleural effusion I will defer to cardiothoracic surgeon for further management  Peripheral neuropathy due to chemotherapy [facility]) Her pain is better controlled with Cymbalta She would like to reduce a dose of morphine sulfate I plan to switch her back to oxycodone due to lower dosage   Orders Placed This Encounter  Procedures  . CT ABDOMEN PELVIS W CONTRAST    Standing Status:   Future    Standing Expiration Date:   07/25/2019    Order Specific Question:   If indicated for the ordered procedure, I authorize the administration of contrast media per Radiology protocol    Answer:   Yes    Order Specific Question:   Preferred imaging location?    Answer:   [facility]    Order Specific Question:   Radiology Contrast Protocol - do NOT remove file path    Answer:   [email or web address]    INTERVAL HISTORY: Please see below for problem oriented charting. 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 Interim finding of moderate ascites within the abdomen and pelvis with additional finding of diffuse nodular infiltration of the omentum and anterior mesenteric fat, the appearance would be consistent with peritoneal carcinomatosis/metastatic disease. Increasing retroperitoneal and upper abdominal adenopathy. 2. Re- demonstrated 3.8 cm cyst in the right adnexa. Enlarging soft tissue density in the left adnexa now with possible cystic component posteriorly. In light of the above findings, concern is for ovarian neoplasm. Correlation with pelvic ultrasound recommended. 3. Small right-sided pleural effusion, new since prior study 4. Stable hypodense splenic lesions since 2017.     06/25/2017 Imaging    US pelvis: 2.9 cm simple appearing cyst in the right ovary. Left ovary grossly unremarkable. Large volume ascites in the pelvis  06/30/2017 - 07/01/2017 Hospital Admission    She was admitted for evaluation of abdominal pain and ascites    07/01/2017 Pathology Results    PERITONEAL/ASCITIC FLUID(SPECIMEN 1 OF 1 COLLECTED 07/01/17): - POORLY DIFFERENTIATED CARCINOMA; SEE COMMENT Source Peritoneal/Ascitic Fluid, (specimen 1 of 1 collected 07/01/17) Gross Specimen: Received is/are 1000  cc's of brownish fluid. (BS:bs) Prepared: # Smears: 0 # Concentration Technique Slides (i.e. ThinPrep): 1 # Cell Block: 1 Additional Studies: Also received Hematology slide - [id number]. Comment The tumor cells are positive for cytokeratin 7 and Pax-8 but negative for cytokeratin 20, CDX-2, GATA-3, Napsin-A and TTF-1. Based on the immunoprofile a gynecology primary is favored    07/01/2017 Procedure    Successful ultrasound-guided diagnostic and therapeutic paracentesis yielding 2.5 liters of peritoneal fluid    07/07/2017 - 07/09/2017 Hospital Admission    She was admitted for management of malignant ascites    07/08/2017 Procedure    Successful ultrasound-guided therapeutic paracentesis yielding 2.7 liters liters of peritoneal fluid    07/12/2017 Procedure    Successful ultrasound-guided paracentesis yielding 1450 mL of peritoneal fluid    07/18/2017 - 07/24/2017 Hospital Admission    She was admitted for expedited treatment    07/18/2017 Tumor Marker    Patient's tumor was tested for the following markers: CA125 Results of the tumor marker test revealed 1941    07/19/2017 - 02/04/2018 Chemotherapy    The patient had 6 cycles of carboplatin & Taxol for chemotherapy treatment, followed by 3 more cycles of carboplatin only     08/06/2017 Procedure    Successful ultrasound-guided therapeutic paracentesis yielding 2.6 liters of peritoneal fluid.    08/09/2017 Tumor Marker    Patient's tumor was tested for the following markers: CA125 Results of the tumor marker test revealed 1665    08/15/2017 Tumor Marker    Patient's tumor was tested for the following markers: CA125 Results of the tumor marker test revealed 937.9    08/20/2017 Imaging    ECHO: Normal LV size with EF 60-65%. Normal RV size and systolic function. No significant valvular abnormalities.    09/18/2017 Imaging    Chest Impression:  1. No evidence thoracic metastasis. 2. Interval increase and RIGHT pleural  effusion.  Abdomen / Pelvis Impression:  1. Interval decrease in intraperitoneal free fluid. 2. Interval decrease in omental nodularity in the LEFT ventral peritoneal space. 3. Interval decrease in nodularity associated with the LEFT ovary. 4. Cystic portion of the RIGHT ovary is increased mildly in size.    09/20/2017 Tumor Marker    Patient's tumor was tested for the following markers: CA125 Results of the tumor marker test revealed 347    10/14/2017 Tumor Marker    Patient's tumor was tested for the following markers: CA125 Results of the tumor marker test revealed 307.4    11/04/2017 Tumor Marker    Patient's tumor was tested for the following markers: CA125 Results of the tumor marker test revealed 262.5    11/28/2017 Imaging    1. Interval decrease in right pleural effusion with resolution of right atelectasis seen previously. 2. New small left pleural effusion, symmetric to the right. 3. No intraperitoneal free fluid on the current study. 4. Continued further decrease in left omental disease, appearing less confluent today than on the prior study. 5. Left ovary remains normal in appearance today and the right adnexal cystic lesion is decreased in size compared to prior study. 6. 14 mm pancreatic cyst is unchanged. Continued attention on follow-up imaging recommended. 7.  Aortic Atherosclerois (ICD10-170.0)    12/13/2017 Tumor Marker    Patient's tumor was tested for the following markers: CA125 Results of the tumor marker test revealed 197.7    01/03/2018 Tumor Marker    Patient's tumor was tested for the following markers: CA125 Results of the tumor marker test revealed 183.1    01/14/2018 Tumor Marker    Patient's tumor was tested for the following markers: CA125 Results of the tumor marker test revealed 177.4    02/04/2018 Tumor Marker    Patient's tumor was tested for the following markers: CA125 Results of the tumor marker test revealed 168.5    02/25/2018 Imaging     1. Omental carcinomatosis appears qualitatively stable to slightly decreased. Stable mild peritoneal thickening in the paracolic gutters. Stable right adnexal cyst. No ascites. No new or progressive metastatic disease in the abdomen or pelvis. 2. Small dependent right pleural effusion is increased. 3. Cystic pancreatic body lesion is decreased and now subcentimeter in size, suggesting a benign lesion. 4. Aortic Atherosclerosis (ICD10-I70.0).    03/03/2018 - 03/07/2018 Hospital Admission    She was hospitalized for GI bleed requiring blood transfusions. Xarelto was placed on hold    03/07/2018 PET scan    1. Persistent hazy omental interstitial nodularity but no hypermetabolism or discrete measurable nodules. No abdominal ascites. 2. No findings for metastatic disease involving the chest. 3. Moderate-sized right pleural effusion and small left pleural effusion.     03/20/2018 Pathology Results    1. Ovary and fallopian tube, right - OVARY AND FALLOPIAN TUBE INVOLVED BY SEROUS CARCINOMA. - PARATUBAL CYST. 2. Uterus +/- tubes/ovaries, neoplastic, cervix, left ovary and fallopian tube - LEFT OVARY: HIGH GRADE SEROUS CARCINOMA WITH TREATMENT EFFECT, SPANNING 2.5 CM. CARCINOMA INVOLVES OVARIAN SURFACE. SEE ONCOLOGY TABLE. - LEFT FALLOPIAN TUBE: INVOLVED BY SEROUS CARCINOMA. - UTERUS: -ENDOMETRIUM: INACTIVE ENDOMETRIUM. NO HYPERPLASIA OR MALIGNANCY. -MYOMETRIUM: UNREMARKABLE. NO MALIGNANCY. -SEROSA: INVOLVED BY SEROUS CARCINOMA. - CERVIX: ENDOCERVICAL POLYP. NO MALIGNANCY. 3. Omentum, resection for tumor - INVOLVED BY SEROUS CARCINOMA. 4. Soft tissue, biopsy, mesenteric nodule - INVOLVED BY SEROUS CARCINOMA. Microscopic Comment 2. OVARY or FALLOPIAN TUBE or PRIMARY PERITONEUM: Procedure: Total hysterectomy and bilateral salpingo-oophorectomy. Omentectomy. Mesenteric lymph node biopsy. Specimen Integrity: Intact. Tumor Site: Left ovary. Ovarian Surface Involvement (required only if  applicable): Present. Fallopian Tube Surface Involvement (required only if applicable): Present, bilateral. Tumor Size: 2.5 cm. Histologic Type: High grade serous carcinoma. Histologic Grade: High grade. Implants (required for advanced stage serous/seromucinous borderline tumors only): N/A. Other Tissue/ Organ Involvement: Bilateral fallopian tubes, right ovary, uterine serosa, omentum. Largest Extrapelvic Peritoneal Focus (required only if applicable): Microscopic, estimated 0.5 cm (omentum). Peritoneal/Ascitic Fluid: Prior Positive ([id number]). Treatment Effect (required only for high-grade serous carcinomas): Present in left ovary. CRS2. Regional Lymph Nodes: No lymph nodes submitted/identified. Pathologic Stage Classification (pTNM, AJCC 8th Edition): ypT3b, ypNX Representative Tumor Block: 1A, 1B, 38F, 33F. Comment(s): The right ovary has only surface deposits with a large paratubal cyst. The left ovary has intraparenchymal tumor with associated treatment effect. Thus the tumor location is classified as a left ovarian primary.    03/20/2018 Surgery    Procedure(s) Performed:  1. Exploratory laparotomy with total hysterectomy and bilateral salpingo-oophorectomy 2. Infragastic Omentectomy  3. Debulking to <1cm gross residual disease   Surgeon: Mart Piggs, MD  Specimens: Uterus Cervix, Bilateral tubes / ovaries and omentum. Mesenteric nodule.  Operative Findings: Debulked to gross residual disease <1cm; however there is miliary disease in multiple locations including the  majority of the abdominal peritoneum (anterior abdominal wall, bilateral gutters), diaphragm (Right>left), majority of small bowel mesentary. Normal appendix. Normal small uterus. Right ovary with a cystic lesion ~3cm, some adhesive disease of right adnexa to rectum/sigmoid. Gross omental disease, which was resected with the omentectomy. Smooth liver surface, but again, diaphragmatic disease noted.        03/20/2018 Genetic Testing    Patient has genetic testing done for ER/PR. Results revealed patient has ER: 90%, PR 0%.     03/31/2018 Tumor Marker    Patient's tumor was tested for the following markers: CA125 Results of the tumor marker test revealed 215.8     Genetic Testing    Patient has genetic testing done for BRCA 1. Results revealed patient has the following: BRCA 1: no loss of expression.     Genetic Testing    Patient has genetic testing done for MMR . Results revealed patient has the following:  MMR: normal    03/31/2018 Genetic Testing    Patient has genetic testing done for BRCA1/2. Results revealed patient has no actionable mutations. She is found to have [place] genetic change of unknown significance    04/21/2018 Imaging    1. No definite findings of residual or recurrent metastatic disease in the abdomen or pelvis status post interval TAHBSO and omentectomy. Stable minimal thickening in the paracolic gutters without discrete peritoneal nodularity. 2. Trace free fluid in the pelvic cul-de-sac. 3. Stable small dependent right pleural effusion. 4. Subcentimeter pancreatic body cystic lesion is stable to slightly decreased. 5. Aortic Atherosclerosis (ICD10-I70.0).    04/21/2018 Tumor Marker    Patient's tumor was tested for the following markers: CA125 Results of the tumor marker test revealed 187.3    04/23/2018 -  Anti-estrogen oral therapy    She is placed on Femara    05/08/2018 Procedure    Successful ultrasound guided right thoracentesis yielding 800 mL of pleural fluid. Fluid cytology is negative for malignancy     05/28/2018 Tumor Marker    Patient's tumor was tested for the following markers: CA125 Results of the tumor marker test revealed 190    06/30/2018 Tumor Marker    Patient's tumor was tested for the following markers: CA125 Results of the tumor marker test revealed 148    07/23/2018 Imaging    Status post hysterectomy and bilateral  salpingo-oophorectomy.  Very mild peritoneal thickening/nodularity, equivocal but worrisome for very mild peritoneal disease. Attention on follow-up is suggested.  Small right pleural effusion with indwelling pleural drain.    07/23/2018 Tumor Marker    Patient's tumor was tested for the following markers: CA125 Results of the tumor marker test revealed 215     REVIEW OF SYSTEMS:   Constitutional: Denies fevers, chills or abnormal weight loss Eyes: Denies blurriness of vision Ears, nose, mouth, throat, and face: Denies mucositis or sore throat Respiratory: Denies cough, dyspnea or wheezes Cardiovascular: Denies palpitation, chest discomfort or lower extremity swelling Gastrointestinal:  Denies nausea, heartburn or change in bowel habits Skin: Denies abnormal skin rashes Lymphatics: Denies new lymphadenopathy or easy bruising Neurological:Denies numbness, tingling or new weaknesses Behavioral/Psych: Mood is stable, no new changes  All other systems were reviewed with the patient and are negative.  I have reviewed the past medical history, past surgical history, social history and family history with the patient and they are unchanged from previous note.  ALLERGIES:  is allergic to codeine.  MEDICATIONS:  Current Outpatient Medications  Medication Sig Dispense Refill  . albuterol (PROAIR  HFA) 108 (90 Base) MCG/ACT inhaler Inhale 2 puffs into the lungs every 6 (six) hours as needed for wheezing or shortness of breath. 1 Inhaler 3  . ALPRAZolam (XANAX) 0.25 MG tablet Take 1 tablet (0.25 mg total) by mouth at bedtime as needed for anxiety. 5 tablet 0  . azelastine (ASTELIN) 0.1 % nasal spray Place 2 sprays into both nostrils 2 (two) times daily as needed for allergies.     Marland Kitchen betamethasone dipropionate (DIPROLENE) 0.05 % ointment Apply 1 application topically daily as needed (psoriasis).    . Cholecalciferol (VITAMIN D) 2000 units CAPS Take 2,000 Units by mouth daily.     Marland Kitchen dicyclomine  (BENTYL) 10 MG capsule Take 1 tab by mouth every morning. May take twice daily as needed. (Patient taking differently: Take 10 mg by mouth daily. ) 60 capsule 6  . diltiazem (CARDIZEM CD) 120 MG 24 hr capsule Take 1 capsule (120 mg total) by mouth every evening. 90 capsule 3  . diltiazem (CARDIZEM CD) 180 MG 24 hr capsule Take 1 capsule (180 mg total) by mouth every morning. 90 capsule 3  . diphenhydrAMINE (BENADRYL) 25 mg capsule Take 25 mg by mouth daily as needed for itching.     . diphenoxylate-atropine (LOMOTIL) 2.5-0.025 MG tablet Take 1 tablet by mouth 4 (four) times daily. (Patient taking differently: Take 1 tablet by mouth 4 (four) times daily as needed for diarrhea or loose stools. ) 360 tablet 1  . diphenoxylate-atropine (LOMOTIL) 2.5-0.025 MG tablet Take 1 tablet by mouth 4 (four) times daily as needed for diarrhea or loose stools. 120 tablet 4  . DULoxetine (CYMBALTA) 60 MG capsule Take 60 mg by mouth daily.    . fluticasone (FLONASE) 50 MCG/ACT nasal spray Place 2 sprays into both nostrils 2 (two) times daily as needed (FOR NASAL CONGESTION.).     Marland Kitchen furosemide (LASIX) 20 MG tablet Take 1 tablet (20 mg total) by mouth daily. 90 tablet 9  . letrozole (FEMARA) 2.5 MG tablet Take 1 tablet (2.5 mg total) by mouth daily. 30 tablet 3  . levothyroxine (SYNTHROID, LEVOTHROID) 137 MCG tablet Take 137 mcg by mouth daily before breakfast. For thyroid therapy    . lidocaine-prilocaine (EMLA) cream Apply 1 application topically as needed. (Patient taking differently: Apply 1 application topically as needed (port). ) 30 g 6  . loperamide (IMODIUM A-D) 2 MG tablet Take 2-4 mg by mouth as needed for diarrhea or loose stools.    . mirtazapine (REMERON) 15 MG tablet TAKE 1 TABLET(15 MG) BY MOUTH AT BEDTIME (Patient taking differently: Take 15 mg by mouth at bedtime. ) 30 tablet 11  . oxyCODONE 10 MG TABS Take 1 tablet (10 mg total) by mouth every 6 (six) hours as needed for severe pain. 60 tablet 0  .  polyvinyl alcohol (LIQUIFILM TEARS) 1.4 % ophthalmic solution Place 1 drop into both eyes 2 (two) times daily as needed for dry eyes.     . potassium chloride SA (K-DUR,KLOR-CON) 20 MEQ tablet Take 10 mEq by mouth daily.    . rivaroxaban (XARELTO) 20 MG TABS tablet Take 20 mg by mouth daily with supper.    Marland Kitchen STIOLTO RESPIMAT 2.5-2.5 MCG/ACT AERS INHALE 2 PUFFS INTO THE LUNGS DAILY 4 g 5  . Ustekinumab (STELARA [place]) Inject 1 Dose into the skin every 3 (three) months.     No current facility-administered medications for this visit.     PHYSICAL EXAMINATION: ECOG PERFORMANCE STATUS: 2 - Symptomatic, <50% confined to  bed  Vitals:   07/24/18 1331  BP: (!) 117/42  Pulse: 97  Resp: 18  Temp: 98 F (36.7 C)  SpO2: 99%   Filed Weights    GENERAL:alert, no distress and comfortable SKIN: skin color, texture, turgor are normal, no rashes or significant lesions EYES: normal, Conjunctiva are pink and non-injected, sclera clear OROPHARYNX:no exudate, no erythema and lips, buccal mucosa, and tongue normal  NECK: supple, thyroid normal size, non-tender, without nodularity LYMPH:  no palpable lymphadenopathy in the cervical, axillary or inguinal LUNGS: clear to auscultation and percussion with normal breathing effort HEART: regular rate & rhythm and no murmurs and no lower extremity edema ABDOMEN:abdomen soft, non-tender and normal bowel sounds Musculoskeletal:no cyanosis of digits and no clubbing  NEURO: alert & oriented x 3 with fluent speech, no focal motor/sensory deficits  LABORATORY DATA:  I have reviewed the data as listed    Component Value Date/Time   NA 136 07/22/2018 1042   NA 135 (L) 09/24/2017 0945   K 4.2 07/22/2018 1042   K 3.7 09/24/2017 0945   CL 104 07/22/2018 1042   CO2 25 07/22/2018 1042   CO2 30 (H) 09/24/2017 0945   GLUCOSE 119 (H) 07/22/2018 1042   GLUCOSE 101 09/24/2017 0945   BUN 14 07/22/2018 1042   BUN 15.3 09/24/2017 0945   CREATININE 0.95 07/22/2018 1042    CREATININE 1.00 03/13/2018 0940   CREATININE 0.8 09/24/2017 0945   CALCIUM 8.7 (L) 07/22/2018 1042   CALCIUM 9.1 09/24/2017 0945   PROT 7.4 07/22/2018 1042   PROT 6.6 09/24/2017 0945   ALBUMIN 3.3 (L) 07/22/2018 1042   ALBUMIN 3.3 (L) 09/24/2017 0945   AST 17 07/22/2018 1042   AST 14 03/13/2018 0940   AST 16 09/24/2017 0945   ALT 10 07/22/2018 1042   ALT 7 03/13/2018 0940   ALT 12 09/24/2017 0945   ALKPHOS 55 07/22/2018 1042   ALKPHOS 52 09/24/2017 0945   BILITOT 0.4 07/22/2018 1042   BILITOT 0.3 03/13/2018 0940   BILITOT 1.13 09/24/2017 0945   GFRNONAA 54 (L) 07/22/2018 1042   GFRNONAA 51 (L) 03/13/2018 0940   GFRAA >60 07/22/2018 1042   GFRAA 59 (L) 03/13/2018 0940    No results found for: SPEP, UPEP  Lab Results  Component Value Date   WBC 9.4 07/22/2018   NEUTROABS 6.2 07/22/2018   HGB 11.4 (L) 07/22/2018   HCT 35.9 (L) 07/22/2018   MCV 88.0 07/22/2018   PLT 241 07/22/2018      Chemistry      Component Value Date/Time   NA 136 07/22/2018 1042   NA 135 (L) 09/24/2017 0945   K 4.2 07/22/2018 1042   K 3.7 09/24/2017 0945   CL 104 07/22/2018 1042   CO2 25 07/22/2018 1042   CO2 30 (H) 09/24/2017 0945   BUN 14 07/22/2018 1042   BUN 15.3 09/24/2017 0945   CREATININE 0.95 07/22/2018 1042   CREATININE 1.00 03/13/2018 0940   CREATININE 0.8 09/24/2017 0945      Component Value Date/Time   CALCIUM 8.7 (L) 07/22/2018 1042   CALCIUM 9.1 09/24/2017 0945   ALKPHOS 55 07/22/2018 1042   ALKPHOS 52 09/24/2017 0945   AST 17 07/22/2018 1042   AST 14 03/13/2018 0940   AST 16 09/24/2017 0945   ALT 10 07/22/2018 1042   ALT 7 03/13/2018 0940   ALT 12 09/24/2017 0945   BILITOT 0.4 07/22/2018 1042   BILITOT 0.3 03/13/2018 0940   BILITOT 1.13 09/24/2017  0945       RADIOGRAPHIC STUDIES: I have reviewed imaging study with patient and daughter I have personally reviewed the radiological images as listed and agreed with the findings in the report. Dg Chest 2  View  Result Date: 07/16/2018 CLINICAL DATA:  Status post prior right thoracentesis, ovarian cancer EXAM: CHEST - 2 VIEW COMPARISON:  06/24/2018 chest radiograph. FINDINGS: Right internal jugular MediPort terminates at the cavoatrial junction. Right chest tube with tip in the medial upper right pleural space. Stable cardiomediastinal silhouette with normal heart size. Stable tiny right apical pneumothorax. No left pneumothorax. Trace bilateral pleural effusions. No pulmonary edema. Mild bibasilar scarring versus atelectasis. IMPRESSION: 1. Stable tiny right apical pneumothorax with right chest tube in place. 2. Trace bilateral pleural effusions with mild bibasilar scarring versus atelectasis. Electronically Signed   By: Ilona Sorrel M.D.   On: 07/16/2018 15:26   Ct Abdomen Pelvis W Contrast  Result Date: 07/23/2018 CLINICAL DATA:  Ovarian cancer, diagnosed 07/2015, status post surgery with adjunct chemotherapy. Oral chemotherapy ongoing. Recurrent pleural effusions. EXAM: CT ABDOMEN AND PELVIS WITH CONTRAST TECHNIQUE: Multidetector CT imaging of the abdomen and pelvis was performed using the standard protocol following bolus administration of intravenous contrast. CONTRAST:  125m OMNIPAQUE IOHEXOL 300 MG/ML  SOLN COMPARISON:  04/21/2018 FINDINGS: Lower chest: Trace right pleural effusion with indwelling pleural drain. Hepatobiliary: Liver is within normal limits. Status post cholecystectomy. No intrahepatic ductal dilatation. Dilated common duct, measuring 11 mm, chronic. Pancreas: Within normal limits. Spleen: Within normal limits. Adrenals/Urinary Tract: Adrenal glands within normal limits. Kidneys are within normal limits.  No hydronephrosis. Bladder is underdistended but unremarkable. Stomach/Bowel: Stomach is within normal limits. No evidence of bowel obstruction. Very mild sigmoid diverticulosis. Vascular/Lymphatic: No evidence of abdominal aortic aneurysm. Atherosclerotic calcifications of the  abdominal aorta and branch vessels. No suspicious abdominopelvic lymphadenopathy. Reproductive: Status post hysterectomy and bilateral salpingo-oophorectomy. Other: No abdominopelvic ascites. Mild peritoneal thickening/nodularity on the left (series 2/images 58-59). Mild soft tissue nodularity along the right pelvic cul-de-sac adjacent to the rectum (series 2/image 58). While equivocal, these findings are worrisome for very mild peritoneal disease. Musculoskeletal: Degenerative changes of the visualized thoracolumbar spine. IMPRESSION: Status post hysterectomy and bilateral salpingo-oophorectomy. Very mild peritoneal thickening/nodularity, equivocal but worrisome for very mild peritoneal disease. Attention on follow-up is suggested. Small right pleural effusion with indwelling pleural drain. Electronically Signed   By: SJulian HyM.D.   On: 07/23/2018 08:37    All questions were answered. The patient knows to call the clinic with any problems, questions or concerns. No barriers to learning was detected.  I spent 15 minutes counseling the patient face to face. The total time spent in the appointment was 20 minutes and more than 50% was on counseling and review of test results  NHeath Lark MD 07/24/2018 3:58 PM

## 2018-07-24 NOTE — Assessment & Plan Note (Signed)
I have review her imaging study extensively I am not convinced about mild nodularity seen on her peritoneum The elevated CA-125 could be related to presence of pleural drain for her pleural effusion She is not symptomatic For now, I recommend we continue on antiestrogen therapy Plan to recheck her blood work and CT imaging study again in 2 months and she agreed to proceed

## 2018-07-24 NOTE — Patient Instructions (Addendum)
Agree with continuing to drain your Pleurx catheter every third day.  The frequency may decrease if the amount of fluid is decreasing.  At some point we could even consider having the tube out if there is no more fluid accumulating.  We will discuss with Dr. Alvy Bimler and Dr. Servando Snare going forward.  Continue Stiolto 2 puffs once daily as you have been taking it. Keep albuterol available to use 2 puffs up to every 4 hours if you were to needed for shortness of breath, wheezing, chest tightness. Flu shot up-to-date. Agree with starting pulmonary rehabilitation after you finish physical therapy.  Your pulmonary function testing today is consistent with COPD.  We can provide this data to the pulmonary rehab office.  If you need a new referral let us know and we will make it. Follow with Dr Lamonte Sakai in 6 months or sooner if you have any problems

## 2018-07-24 NOTE — Assessment & Plan Note (Signed)
Continue Stiolto 2 puffs once daily as you have been taking it. Keep albuterol available to use 2 puffs up to every 4 hours if you were to needed for shortness of breath, wheezing, chest tightness. Flu shot up-to-date. Agree with starting pulmonary rehabilitation after you finish physical therapy.  Your pulmonary function testing today is consistent with COPD.  We can provide this data to the pulmonary rehab office.  If you need a new referral let us know and we will make it. Follow with Dr Lamonte Sakai in 6 months or sooner if you have any problems

## 2018-07-24 NOTE — Assessment & Plan Note (Signed)
Her pain is better controlled with Cymbalta She would like to reduce a dose of morphine sulfate I plan to switch her back to oxycodone due to lower dosage

## 2018-07-24 NOTE — Assessment & Plan Note (Signed)
Cytology remains negative.  Agree with continuing to drain your Pleurx catheter every third day.  The frequency may decrease if the amount of fluid is decreasing.  At some point we could even consider having the tube out if there is no more fluid accumulating.  We will discuss with Dr. Alvy Bimler and Dr. Servando Snare going forward.

## 2018-07-24 NOTE — Telephone Encounter (Signed)
Gave patient avs and calendar.  Patient preferred to wait to get contrast from Radiology.  Radiology will call with appt for CT.

## 2018-07-24 NOTE — Telephone Encounter (Signed)
Faxed Lomotil prescription per Nicoletta Ba PA, to [place], [place], [place], today 07-24-2018.

## 2018-07-24 NOTE — Telephone Encounter (Signed)
Per Nicoletta Ba PA, Ok to send Lomotil tablets, 4 tabs daily as needed for cramping, diarrhea. [facility], [place], [place], [place].

## 2018-07-25 ENCOUNTER — Telehealth: Payer: Self-pay

## 2018-07-25 NOTE — Telephone Encounter (Signed)
Patient's daughter contacted the office to get clarification on when to call the office when draining her PleurX catheter.  She was advised to contact the office when [name]'s PleurX drains 50 ml's or less 3 consecutive times.  She acknowledged receipt.

## 2018-07-28 ENCOUNTER — Telehealth: Payer: Self-pay | Admitting: Emergency Medicine

## 2018-07-28 DIAGNOSIS — J431 Panlobular emphysema: Secondary | ICD-10-CM

## 2018-07-28 NOTE — Telephone Encounter (Signed)
Called and spoke with [place] who wanted to know if it was okay to change pt's diagnosis that was originally used for the pulmonary rehab referral.  Per Rosanne Sack, the original code that was used was chronic bronchitis but Shauna Hugh was wanting to know if we could change pt's diagnosis to COPD.  Dr. Lamonte Sakai, please advise on this.thanks!

## 2018-07-30 ENCOUNTER — Encounter (HOSPITAL_COMMUNITY): Payer: Self-pay

## 2018-07-30 ENCOUNTER — Ambulatory Visit (HOSPITAL_COMMUNITY): Payer: Medicare Other | Attending: Hematology and Oncology

## 2018-07-30 ENCOUNTER — Ambulatory Visit: Payer: Self-pay | Admitting: Obstetrics

## 2018-07-30 DIAGNOSIS — R29898 Other symptoms and signs involving the musculoskeletal system: Secondary | ICD-10-CM | POA: Diagnosis present

## 2018-07-30 DIAGNOSIS — R2689 Other abnormalities of gait and mobility: Secondary | ICD-10-CM

## 2018-07-30 DIAGNOSIS — M6281 Muscle weakness (generalized): Secondary | ICD-10-CM | POA: Insufficient documentation

## 2018-07-30 DIAGNOSIS — R262 Difficulty in walking, not elsewhere classified: Secondary | ICD-10-CM | POA: Diagnosis present

## 2018-07-30 NOTE — Therapy (Signed)
[facility] [place], [place], [address] Phone: [phone]   Fax:  [phone]   PHYSICAL THERAPY DISCHARGE SUMMARY  Visits from Start of Care: 10  Current functional level related to goals / functional outcomes: See below   Remaining deficits: See below   Education / Equipment: Continue HEP  Plan: Patient agrees to discharge.  Patient goals were partially met. Patient is being discharged due to being pleased with the current functional level.  ?????     Physical Therapy Treatment  Patient Details  Name: [name] MRN: [id number] Date of Birth: July 22, 1933 Referring Provider (PT): Dr. Heath Lark   Encounter Date: 07/30/2018  PT End of Session - 07/30/18 1302    Visit Number  10    Number of Visits  12    Date for PT Re-Evaluation  06/25/18   mini-re-assess 06/04/2018   Authorization Type  Medicare; secondary - Generic Commercial    Authorization Time Period  05/14/18-06/25/2018    Authorization - Visit Number  4    Authorization - Number of Visits  10    PT Start Time  1300    PT Stop Time  1325    PT Time Calculation (min)  25 min    Equipment Utilized During Treatment  Gait belt    Activity Tolerance  Patient tolerated treatment well    Behavior During Therapy  WFL for tasks assessed/performed       Past Medical History:  Diagnosis Date  . Anxiety   . Chronic blood loss anemia    03-04-2018 diverticular bleed and rectal bleeding,  transfused 2 units PRBCs 03-08-2018  . Colitis   . COPD (chronic obstructive pulmonary disease) ([place])    pulmologist-  dr byrum  . Depression   . Diastolic CHF, chronic ([place])    followed by cardiology  . Diverticulosis   . Dyspnea on effort   . Dysrhythmia    a-fib  . Family history of colon cancer   . Family hx of colon cancer   . Fibromyalgia   . Genetic testing 04/07/2018   MyRisk (35 genes) @ [facility] - No pathogenic mutations detected  . GERD  (gastroesophageal reflux disease)    03-17-2018 per pt has not had any gerd issues in long time and no meds prn  . Hemorrhoids   . Hiatal hernia   . History of lower GI bleeding 03/04/2018   admission--  dx diverticular bleed and rectal bleed in setting of xarelto-- xarelto stopped and transfused 03-08-2018 2 units PRBCs  . History of rectal polyps   . History of shingles   . Hypothyroidism   . IBS (irritable bowel syndrome)   . Lymphocytic colitis    FOLLOWED BY DR Henrene Pastor  . Malignant ascites    dx at admission 09/ 2018 abdominal s/p  parencentesis 07-01-2017 2.5L,  07-08-2016  2.7L,  07-12-2017  1474m  . Neuropathy due to chemotherapeutic drug ([place]   . Osteoporosis   . Ovarian cancer ([facility] oncologist-  gors[place]/  dr pGerarda Fraction  chemotherapy 07-19-2017 to 11-04-2017  . Paroxysmal atrial fibrillation ([place]    CARDIOLOGIST-  DR S. MCDOWELL-  first dx'd 07/ 2014---was taking xarelto up until 03-07-2018 stopped due to lower GI bleed  . Pleural effusion    s/p  right thoracentesis, 02-2018 1.3L and 03-17-2018 right thoracentesis 6454m, post cxr no residual effusion  . Psoriatic arthritis ([place]  . Schatzki's ring    S/P  DILATERAL  2013  . Seasonal allergic rhinitis     Past Surgical History:  Procedure Laterality Date  . CARDIOVASCULAR STRESS TEST  09/23/2012   Low risk lexiscan nuclear study w/ apical thinning but no evidence of ischemia/  normal LV function and wall motion , ef 75%  . CATARACT EXTRACTION W/ INTRAOCULAR LENS  IMPLANT, BILATERAL  10/2016  . CHEST TUBE INSERTION Right 06/24/2018   Procedure: INSERTION PLEURAL DRAINAGE CATHETER;  Surgeon: Grace Isaac, MD;  Location: [facility];  Service: Thoracic;  Laterality: Right;  . COLONOSCOPY    . DEBULKING N/A 03/20/2018   Procedure: DEBULKING;  Surgeon: Isabel Caprice, MD;  [facility];  Service: Gynecology;  Laterality: N/A;  . EXAM UNDER ANESTHESIA WITH MANIPULATION OF KNEE Left 12-20-2003  dr Noemi Chapel   post TKA  .  FEMUR IM NAIL Left 12/11/2013   Procedure: INTRAMEDULLARY (IM) NAIL FEMORAL;  Surgeon: [name], MD;  [facility];  Service: Orthopedics;  Laterality: Left;  . HYSTERECTOMY ABDOMINAL WITH SALPINGO-OOPHORECTOMY Bilateral 03/20/2018   Procedure: TOTAL HYSTERECTOMY ABDOMINAL WITH BILATERAL  SALPINGO-OOPHORECTOMY;  Surgeon: Isabel Caprice, MD;  [facility];  Service: Gynecology;  Laterality: Bilateral;  . IR FLUORO GUIDE PORT INSERTION RIGHT  07/22/2017  . IR PARACENTESIS  07/12/2017  . IR THORACENTESIS ASP PLEURAL SPACE W/IMG GUIDE  03/17/2018  . IR THORACENTESIS ASP PLEURAL SPACE W/IMG GUIDE  05/08/2018  . IR US GUIDE VASC ACCESS RIGHT  07/22/2017  . KNEE ARTHROSCOPY W/ LATERAL RELEASE Left 09-03-2005   dr Noemi Chapel  [facility]   w/  Lysis Adhesions,  excision loose body's  . LAPAROSCOPIC CHOLECYSTECTOMY  12-04-2010  dr zeigler  . LAPAROTOMY N/A 03/20/2018   Procedure: EXPLORATORY LAPAROTOMY;  Surgeon: Isabel Caprice, MD;  [facility];  Service: Gynecology;  Laterality: N/A;  . OMENTECTOMY N/A 03/20/2018   Procedure: OMENTECTOMY;  Surgeon: Isabel Caprice, MD;  [facility];  Service: Gynecology;  Laterality: N/A;  . TOTAL KNEE ARTHROPLASTY Left 09-08-2003   dr Noemi Chapel  [facility]  . TOTAL KNEE REVISION  08/06/2012   Procedure: TOTAL KNEE REVISION;  Surgeon: [name], MD;  [facility];  Service: Orthopedics;  Laterality: Left;  Left Total Knee Arthroplasty Revision  . TRANSTHORACIC ECHOCARDIOGRAM  08/20/2017   ef 60-65%,  grade 1 diastolic dysfunction/  trivial AR and TR  . Uterine polypectomy      There were no vitals filed for this visit.  Subjective Assessment - 07/30/18 1302    Subjective  Pt reports that her pleural cather was placed and she is feeling so much better. Her breathing has significantly improved and she is down to only draining it eveyr 3 days. She was d/c from [facility] services last week. She is to start pulmonary rehab when OPPT services are complete.     How  long can you sit comfortably?  not limited    How long can you stand comfortably?  5-6 minutes    How long can you walk comfortably?  5 minutes with RW    Currently in Pain?  No/denies    Pain Onset  More than a month ago         [facility] PT Assessment - 07/30/18 0001      Assessment   Medical Diagnosis  Generalized Weakness    Referring Provider (PT)  Dr. Heath Lark      Strength   Right Hip Flexion  4/5   was 4   Right Hip  Extension  4-/5   was 4-   Right Hip ABduction  4+/5   was 4+   Left Hip Flexion  5/5   was 4+   Left Hip Extension  4-/5   was 3+   Left Hip ABduction  4+/5   was 4+   Right Knee Flexion  4+/5   was 4+   Left Knee Flexion  4+/5   was 4+   Right Ankle Dorsiflexion  4+/5   was 4+   Left Ankle Dorsiflexion  4+/5   was 4+     Ambulation/Gait   Ambulation Distance (Feet)  256 Feet   3MWT   Assistive device  Rolling walker    Gait Pattern  Step-through pattern      Static Standing Balance   Static Standing - Balance Support  No upper extremity supported    Static Standing Balance -  Activities   Single Leg Stance - Right Leg;Single Leg Stance - Left Leg    Static Standing - Comment/# of Minutes  R: 5.5sec, L: 3.3sec      Standardized Balance Assessment   Five times sit to stand comments   15.5sec            PT Education - 07/30/18 1334    Education provided  Yes    Education Details  continue HEP, discharge plans    Person(s) Educated  Patient;Child(ren)    Methods  Explanation    Comprehension  Verbalized understanding          PT Short Term Goals - 07/30/18 1304      PT SHORT TERM GOAL #1   Title  Patient and caregivers will be independent in initial HEP with routine performance.     Baseline  10/23: compliant    Time  3    Period  Weeks    Status  Achieved      PT SHORT TERM GOAL #2   Title  Patient with tolerate the standing position with upper extremity support for 5 minutes before needing to rest to improve her ability  to perform standing functional activities and ADLs within her home environment.    Baseline  06/05/18: Patient reported being able to stand for 5-6 minutes.     Time  3    Period  Weeks    Status  Achieved      PT SHORT TERM GOAL #3   Title  Patient will increase MMT grades by 1/2 grade in deficient musculature to show improvement in her strength and stability to perform functional activities.    Baseline  10/23: see MMT    Time  3    Period  Weeks    Status  On-going      PT SHORT TERM GOAL #4   Title  Patient will be able to stand on either foot for 12 seconds to exhibit improved balance and decreased risk for falls.    Baseline  10/23: R: 5.5sec, L: 3.3sec    Time  3    Period  Weeks    Status  On-going        PT Long Term Goals - 07/30/18 1304      PT LONG TERM GOAL #1   Title  Patient will ambulate >300 feet with LRAD during the 2 MWT to improve her ability to ambulate further in community to allow to her go enjoy going out for a meal.    Baseline  10/23: Patient ambulated 250 feet during 2MWT  with RW    Time  6    Period  Weeks    Status  On-going      PT LONG TERM GOAL #2   Title  Patient with tolerate the standing position with upper extremity support for 10 minutes before needing to rest to improve her ability to stand and have a conversation with a friend or relative.    Baseline  10/23: 10 mins    Time  6    Period  Weeks    Status  Achieved      PT LONG TERM GOAL #3   Title  Patient will increase MMT grades by 1 grade in deficent musculature to show continued improvement in her strength and stability to perform functional activities at home independently.    Baseline  10/23: see MMT    Time  6    Period  Weeks    Status  Partially Met      PT LONG TERM GOAL #4   Title  Patient will be able to stand on either foot for 16 seconds to exhibit improved balance and further decreased risk for falls.    Baseline  10/23: R: 5.5sec, L: 3.3sec    Time  6    Period   Weeks    Status  On-going      PT LONG TERM GOAL #5   Title  Pt will be able to perform 5xSTS in 12 sec or < with no UE in order to demo improved functional BLE strength and overall balance.    Baseline  10/23: 15.5sec    Time  6    Period  Weeks    Status  On-going      PT LONG TERM GOAL #6   Title  Pt will report requiring assistance for ADLs and IADLs at home 25% of the time or < to demonstrate improved overall functional strength, endurance, and balance in order to promote independence at home.    Baseline  --    Time  6    Period  Weeks    Status  Achieved            Plan - 07/30/18 1328    Clinical Impression Statement  Pt returns to therapy after approximately 6 weeks due to having a pleural catheter placed and having [facility] services for nursing following that; she was d/c from [facility] services last week. PT reassessed pt's goals and outcome measures this date. Pt has either maintained or slightly improved throughout all aspects of functional mobility, strength, gait, and balance. Pt's main limiting factor continues to be her breathing as her function has normalized. Pt feels comfortable and confident with where she's at functionally and feels the pulmonary rehab will be even more beneficial. PT reviewed her current HEP and she verbalized understanding; no additions made as she already has an all-encompassing HEP. Pt does not require anymore skilled PT services at this time and will be d/c to her HEP.    Rehab Potential  Good    PT Frequency  2x / week    PT Duration  6 weeks    PT Treatment/Interventions  ADLs/Self Care Home Management;Gait training;Stair training;Functional mobility training;Therapeutic activities;Patient/family education;Balance training;Neuromuscular re-education;Therapeutic exercise;Manual techniques;Energy conservation    PT Next Visit Plan  d/c to HEP    PT Home Exercise Plan  9/3: wall squats +continuing HEP from prior OPPT services: intial - lap walking at home;  supine bridges; 3/18: supine clams with RTB; 3/25: SAQ;  4/5: standing abd, marching, LAQ, knee flexion; 4/15: supine figure 4, supine SKTC; 4/17: sidelying hip abd; 5/1: tandem stance and SLS; 03/12/18: Standing hip extension with support 2 x 10 each LE 1x/day, STS no support 2 x 10 1x/day      Consulted and Agree with Plan of Care  Patient;Family member/caregiver    Family Member Consulted  dtr       Patient will benefit from skilled therapeutic intervention in order to improve the following deficits and impairments:  Abnormal gait, Pain, Decreased mobility, Decreased coordination, Decreased activity tolerance, Decreased endurance, Decreased strength, Decreased balance, Difficulty walking  Visit Diagnosis: Muscle weakness (generalized) - Plan: PT plan of care cert/re-cert  Other abnormalities of gait and mobility - Plan: PT plan of care cert/re-cert  Other symptoms and signs involving the musculoskeletal system - Plan: PT plan of care cert/re-cert  Difficulty in walking, not elsewhere classified - Plan: PT plan of care cert/re-cert     Problem List Patient Active Problem List   Diagnosis Date Noted  . Other constipation 05/28/2018  . Psoriatic arthritis ([place]) 04/22/2018  . Genetic testing 04/07/2018  . Family history of colon cancer   . History of GI diverticular bleed 03/12/2018  . Lower GI bleed   . Diverticulosis of colon with hemorrhage   . Rectal bleeding 03/04/2018  . Acute GI bleeding 03/04/2018  . Thrombocytopenia ([place]) 01/03/2018  . Anemia, chronic disease 12/13/2017  . Mild protein-calorie malnutrition ([place]) 12/13/2017  . Peripheral neuropathy due to chemotherapy ([place]) 12/13/2017  . S/P thoracentesis   . Atrial fibrillation with RVR ([place]) 11/08/2017  . HCAP (healthcare-associated pneumonia) 11/08/2017  . Pleural effusion 09/20/2017  . Acute on chronic diastolic heart failure ([place]) 08/19/2017  . Pancytopenia, acquired ([place]) 08/15/2017  . Protein-calorie malnutrition,  moderate ([place]) 07/30/2017  . Generalized weakness 07/30/2017  . Antineoplastic chemotherapy induced pancytopenia ([place]) 07/20/2017  . Left ovarian epithelial cancer ([place]) 07/18/2017  . PNA (pneumonia) 07/07/2017  . Ascites 06/30/2017  . Ascites, malignant 06/30/2017  . Elevated CA-125 01/18/2017  . Depression 12/23/2015  . Peripheral edema 08/23/2015  . Fracture of hip, left, closed ([place]) 12/08/2013  . Hip fracture ([place]) 12/08/2013  . PAD (peripheral artery disease) ([place]) 11/25/2013  . Encounter for therapeutic drug monitoring 11/09/2013  . Cough 03/20/2013  . Total knee replacement status 10/07/2012  . Knee pain 10/07/2012  . Knee stiffness 10/07/2012  . Tachycardia 09/17/2012  . Chest pain 09/17/2012  . Difficulty in walking(719.7) 09/16/2012  . Muscle weakness (generalized) 09/16/2012  . Postop Acute blood loss anemia 08/08/2012  . Instability of prosthetic knee ([place]) 08/06/2012  . Bloating 02/14/2012  . Upper abdominal pain 02/14/2012  . Allergic rhinitis, seasonal 02/15/2011  . COLITIS 02/20/2010  . Diarrhea 01/02/2010  . ABDOMINAL PAIN -GENERALIZED 01/02/2010  . PERSONAL HX COLONIC POLYPS 01/02/2010  . Hypothyroidism 12/28/2009  . COPD (chronic obstructive pulmonary disease) ([place]) 12/28/2009  . Arthropathy 12/28/2009        Geraldine Solar PT, DPT  [facility] [address] [place], [place], [address] Phone: [phone]   Fax:  [phone]  Name: [name] MRN: [id number] Date of Birth: 20-Feb-1933

## 2018-08-05 ENCOUNTER — Telehealth: Payer: Self-pay

## 2018-08-05 ENCOUNTER — Encounter: Payer: Self-pay | Admitting: *Deleted

## 2018-08-05 NOTE — Telephone Encounter (Signed)
[name] with [facility] contacted the office about [name]'s diagnosis code for the recent Rx sent for PleurX drainage kits.  J90 dx given for Pleural effusion via voice mail.  Will await a return call if warranted.

## 2018-08-06 NOTE — Telephone Encounter (Signed)
Try using the dx emphysema

## 2018-08-06 NOTE — Telephone Encounter (Signed)
Called pulm rehab and spoke with Diane who reported concern that ov notes do not contain an Emphysema diagnosis - only COPD.  Order to pulmonary rehab re-placed with Emphysema as the primary diagnosis, and a comment of COPD added because per ov note, PFT confirmed COPD diagnosis.  Order placed and received by Diane Nothing further needed; will sign off

## 2018-08-06 NOTE — Telephone Encounter (Signed)
Dr. Lamonte Sakai - please advise. Thanks.

## 2018-08-08 ENCOUNTER — Encounter: Payer: Self-pay | Admitting: Obstetrics

## 2018-08-08 ENCOUNTER — Inpatient Hospital Stay: Payer: Medicare Other | Attending: Hematology and Oncology | Admitting: Obstetrics

## 2018-08-08 VITALS — BP 139/63 | HR 103 | Temp 98.0°F | Resp 20 | Ht 64.0 in | Wt 162.7 lb

## 2018-08-08 DIAGNOSIS — Z90710 Acquired absence of both cervix and uterus: Secondary | ICD-10-CM | POA: Diagnosis not present

## 2018-08-08 DIAGNOSIS — J90 Pleural effusion, not elsewhere classified: Secondary | ICD-10-CM | POA: Diagnosis not present

## 2018-08-08 DIAGNOSIS — C569 Malignant neoplasm of unspecified ovary: Secondary | ICD-10-CM | POA: Diagnosis present

## 2018-08-08 DIAGNOSIS — Z90722 Acquired absence of ovaries, bilateral: Secondary | ICD-10-CM | POA: Diagnosis not present

## 2018-08-08 DIAGNOSIS — Z9221 Personal history of antineoplastic chemotherapy: Secondary | ICD-10-CM | POA: Insufficient documentation

## 2018-08-08 DIAGNOSIS — Z87891 Personal history of nicotine dependence: Secondary | ICD-10-CM | POA: Insufficient documentation

## 2018-08-08 NOTE — Progress Notes (Signed)
Progress Note: Gyn-Onc   CC:  Chief Complaint  Patient presents with  . Malignant neoplasm of ovary, unspecified laterality [facility])   GYN Oncologic Summary 1. Stage X Ovarian Cancer o Neoadjuvant Carbo/Taxol C1 07/19/2017 --> C6 11/04/2017 changed to C1 single agent carbo 12/13/2017 --> C3 single agent carbo 02/04/2018 o 03/20/18 Debulking with TAH/BSO/Omentectomy to R1 disease o Adjuvant hormonal therapy   HPI: [name]  is a very nice 84 y.o. P1  . Interval History:   I last saw her the end of July.   Since that time her pulmonologist sent her to [facility]. She has had recurrent pleural effusions (non-malignant on cytology). Thus a pleurex has been placed in hopes this help prevent reaccumulation of fluid.  It is notable her CA125 elevated, but this was coincidental to the placement of the pleurex.   She is feeling well. She denies loss of appetite, denies weight loss. She is having normal bowel and bladder function. She notes a fold / outpouching in her upper abdomen since surgery. Denies feeling distended/bloating.   . Initial Presentation: She was initially seen in consultation 01/2017 at the request of Dr Lynnette Caffey for a 4cm right ovarian cyst and elevated CA 125.  The cyst was first detected on imaging (CT) in 2012 (6 years prior to initial referral) and was approximately 4cm. It was again seen on a CT scan in August, 2017 and was stable in dimension, and described by radiology as likely being benign.   Her CA 125 in May 2017 was 45. It was 4 in August, 2017.   She was scheduled for a repeat CA 125 in March, 13, 2018 this was elevated to 133.5.  It was at this time she was referred to [facility].  The patient denied abdominal bloating or early satiety and she had no vaginal bleeding.  Her major complaint was back pain which has been very bad in the past 6 months and she has been regularly on steroid tapers (most recently completed 2 weeks ago). She has received  multiple joint injections for this also.  She takes Xarelto for atrial fibrillation. She has ulcerative colitis. Her bouts have been less prominent lately, likely secondary to prednisone use for her back.  CT scan on 02/07/17 was ordered due to the rising CA 125. It showed a normal appearance of uterus. A simple appearing cystic lesion is again seen in the right adnexa which measures 3.8 x 3.7cm. This remains stable since 2012, consistent with benign etiology. No other masses or inflammatory process identified.  Given her serious medical comorbidities, including COPD, surgery was not performed for what appeared to be consistent with a benign lesion as the risk were felt to outweigh the benefits.  In August, 2018 she began experiencing increasing abdominal distension and discomfort.  On 07/07/17 she was admitted to [facility] for abdominal distension and SOB. CT imaging showed interim finding of moderate ascites within the abdomen and pelvis with additional finding of diffuse nodular infiltration of the omentum and anterior mesenteric fat, the appearance would be consistent with peritoneal carcinomatosis/metastatic disease. Increasing retroperitoneal and upper abdominal adenopathy. Re- demonstrated 3.8 cm cyst in the right adnexa. Enlarging soft tissue density in the left adnexa now with possible cystic component posteriorly. In light of the above findings, concern is for ovarian neoplasm. Correlation with pelvic ultrasound recommended. Small right-sided pleural effusion, new since prior study Stable hypodense splenic lesions since 2017.  US guided paracentesis was performed on 07/01/17 which showed  adenocarcinoma, favor gynecologic primary. She had multiple paracenteses because her ascites continued to accumulate and caused respiratory distress. In addition she had pleural effusions but these were considered too small for thoracentesis.   She initiated neoadjuvant chemotherapy due to her poor  performance status. She underwent chemotherapy with Carbo/Taxol x 6 cycles 07/19/17-11/04/17. She saw Dr. Fermin Schwab after Cycle 3 and due to her poor performance status and continued oxygen requirement was felt not to be a surgical candidate at that time.   She then had PNA and was sent to rehab for a bit. She followed up with Dr. Skeet Latch 12/05/17 with initial thought for reconsideration of interval debulking, but due to the setback from the PNA and continued poor pulmonary function it was recommended against surgery at that time. She was having a difficult time with double-agent chemotherapy; experiencing debilitated state and significant arthralgias. She was changed to single agent Carboplatin (about 5.5 weeks from her last Carbo/Tax dosing).   She had been receiving single agent chemotherapy (Carbo) since 12/13/17. She received Cycle 3 on 02/04/18.  Dr. Gerarda Fraction met the patient for the first time 02/27/2018.  Consideration was given to interval debulking due to improved performance status and platinum sensitivity with continued response (by CA125 and imaging)  I did take her for interval debulking which resulted in RD1 disease 03/20/2018 performing Exploratory laparotomy with total hysterectomy and bilateral salpingo-oophorectomy, infragastic omentectomy. She was taken off of chemo (no adjuvant chemo was prescribed) and has continued on hormonal therapy.  She did have an admission for rectal bleeding.  She was transfused and seen by GI.  Has history of atrial fibrillation and she was on Xarelto which was then held  Measurement of disease:  Recent Labs    08/09/17 0948 08/15/17 0935 09/20/17 1011 10/14/17 0838 11/04/17 0853 12/13/17 1232 01/03/18 1305 01/14/18 1343 02/04/18 1238 03/13/18 0940 03/31/18 0841 04/21/18 1152 05/28/18 1021 06/30/18 1006 07/22/18 1042  CAN125 1,665.0* 937.9* 347.0* 307.4* 262.5* 197.7* 183.1* 177.4* 168.5* 178.7* 215.8* 187.3* 190.0* 148.0* 215.0*     Carbo/Taxol C1 07/19/2017 --> C6 11/04/2017 changed to C1 single agent carbo 12/13/2017 --> C3 single agent carbo 02/04/2018   Radiology:  Multiple CT scans in the system reviewed by me personally  03/07/2018-PET-moderate sized right pleural effusion, small left pleural effusion.  No hypermetabolic mediastinal or hilar nodes.  Stable hazy interstitial nodularity in the omentum without discrete measurable nodules or hyper mid to botulism.  No evidence of peritoneal surface disease involving the liver.  No hepatic metastatic disease.  No hypermetabolic abdominal or pelvic lymph nodes.  Stable 4 cm simple appearing right adnexal cyst no hypermetabolic in or around that lesion.  Left hip hardware. Dg Chest 2 View  Result Date: 07/16/2018 CLINICAL DATA:  Status post prior right thoracentesis, ovarian cancer EXAM: CHEST - 2 VIEW COMPARISON:  06/24/2018 chest radiograph. FINDINGS: Right internal jugular MediPort terminates at the cavoatrial junction. Right chest tube with tip in the medial upper right pleural space. Stable cardiomediastinal silhouette with normal heart size. Stable tiny right apical pneumothorax. No left pneumothorax. Trace bilateral pleural effusions. No pulmonary edema. Mild bibasilar scarring versus atelectasis. IMPRESSION: 1. Stable tiny right apical pneumothorax with right chest tube in place. 2. Trace bilateral pleural effusions with mild bibasilar scarring versus atelectasis. Electronically Signed   By: Ilona Sorrel M.D.   On: 07/16/2018 15:26   Dg Chest 2 View  Result Date: 06/24/2018 CLINICAL DATA:  Preoperative evaluation for right-sided chest tube for fluid. EXAM: CHEST - 2 VIEW  COMPARISON:  Prior radiograph from 06/17/2018. FINDINGS: Cardiac and mediastinal silhouettes are stable in size and contour, and remain within normal limits. Aortic atherosclerosis. Right-sided Port-A-Cath in place with tip overlying the cavoatrial junction. Blunting of the right costophrenic angle  suggestive of a small right pleural effusion, slightly decreased from previous. Mild bibasilar subsegmental atelectasis. No focal infiltrates. No pneumothorax. No pulmonary edema. No acute osseous abnormality. Multilevel spurring noted within the visualized spine. IMPRESSION: 1. Mild blunting of the right costophrenic angle, suggesting small right pleural effusion, slightly decreased relative to 06/17/2018. 2. Mild bibasilar subsegmental atelectasis. 3. Aortic atherosclerosis. Electronically Signed   By: Jeannine Boga M.D.   On: 06/24/2018 06:17   Dg Chest 2 View  Result Date: 06/17/2018 CLINICAL DATA:  History of ovarian carcinoma.  Pleural effusion. EXAM: CHEST - 2 VIEW COMPARISON:  June 03, 2018 FINDINGS: Port-A-Cath tip is in the superior vena cava. No pneumothorax. There is a fairly small right pleural effusion with right base atelectasis. Lungs elsewhere are clear. Heart size and pulmonary vascularity are normal. No adenopathy. There is aortic atherosclerosis. There is degenerative change in the thoracic spine. No blastic or lytic bone lesions are evident. IMPRESSION: Small right pleural effusion with right base atelectasis. No edema or consolidation. No adenopathy evident. There is aortic atherosclerosis. Aortic Atherosclerosis (ICD10-I70.0). Electronically Signed   By: Lowella Grip III M.D.   On: 06/17/2018 15:53   Dg Chest 2 View  Result Date: 06/03/2018 CLINICAL DATA:  History of right pleural effusion, shortness of breath, cough EXAM: CHEST - 2 VIEW COMPARISON:  Portable chest x-ray of 05/08/2018 FINDINGS: The lungs are slightly hyperaerated. There do appear to be small pleural effusions present with mild volume loss at the lung bases. No pneumonia is seen. Mediastinal and hilar contours are unchanged and the heart is within normal limits in size. There are degenerative changes in the mid to lower thoracic spine. Port-A-Cath is present with the tip overlying the lower SVC.  IMPRESSION: 1. Suspect small pleural effusions with mild basilar atelectasis. 2. Hyperaeration.  No pneumonia. Electronically Signed   By: Ivar Drape M.D.   On: 06/03/2018 17:06   Ct Abdomen Pelvis W Contrast  Result Date: 07/23/2018 CLINICAL DATA:  Ovarian cancer, diagnosed 07/2015, status post surgery with adjunct chemotherapy. Oral chemotherapy ongoing. Recurrent pleural effusions. EXAM: CT ABDOMEN AND PELVIS WITH CONTRAST TECHNIQUE: Multidetector CT imaging of the abdomen and pelvis was performed using the standard protocol following bolus administration of intravenous contrast. CONTRAST:  151m OMNIPAQUE IOHEXOL 300 MG/ML  SOLN COMPARISON:  04/21/2018 FINDINGS: Lower chest: Trace right pleural effusion with indwelling pleural drain. Hepatobiliary: Liver is within normal limits. Status post cholecystectomy. No intrahepatic ductal dilatation. Dilated common duct, measuring 11 mm, chronic. Pancreas: Within normal limits. Spleen: Within normal limits. Adrenals/Urinary Tract: Adrenal glands within normal limits. Kidneys are within normal limits.  No hydronephrosis. Bladder is underdistended but unremarkable. Stomach/Bowel: Stomach is within normal limits. No evidence of bowel obstruction. Very mild sigmoid diverticulosis. Vascular/Lymphatic: No evidence of abdominal aortic aneurysm. Atherosclerotic calcifications of the abdominal aorta and branch vessels. No suspicious abdominopelvic lymphadenopathy. Reproductive: Status post hysterectomy and bilateral salpingo-oophorectomy. Other: No abdominopelvic ascites. Mild peritoneal thickening/nodularity on the left (series 2/images 58-59). Mild soft tissue nodularity along the right pelvic cul-de-sac adjacent to the rectum (series 2/image 58). While equivocal, these findings are worrisome for very mild peritoneal disease. Musculoskeletal: Degenerative changes of the visualized thoracolumbar spine. IMPRESSION: Status post hysterectomy and bilateral  salpingo-oophorectomy. Very mild peritoneal thickening/nodularity, equivocal but  worrisome for very mild peritoneal disease. Attention on follow-up is suggested. Small right pleural effusion with indwelling pleural drain. Electronically Signed   By: Julian Hy M.D.   On: 07/23/2018 08:37   Dg Chest Port 1 View  Result Date: 06/24/2018 CLINICAL DATA:  Right-sided PleurX insertion EXAM: PORTABLE CHEST 1 VIEW COMPARISON:  Earlier today FINDINGS: New right-sided tunneled pleural catheter with tip at the apex. There is trace apical pneumothorax. No pleural fluid is seen. No new lung opacity. Mild atelectasis at the left base. Normal heart size. Porta catheter on the right with tip at the SVC. IMPRESSION: 1. New tunneled pleural catheter on the right with tip at the apex. 2. Trace right apical pneumothorax. Electronically Signed   By: Monte Fantasia M.D.   On: 06/24/2018 09:24   Dg C-arm 1-60 Min-no Report  Result Date: 06/24/2018 Fluoroscopy was utilized by the requesting physician.  No radiographic interpretation.         Oncologic History:     Current Meds:  Outpatient Encounter Medications as of 08/08/2018  Medication Sig  . albuterol (PROAIR HFA) 108 (90 Base) MCG/ACT inhaler Inhale 2 puffs into the lungs every 6 (six) hours as needed for wheezing or shortness of breath.  . ALPRAZolam (XANAX) 0.25 MG tablet Take 1 tablet (0.25 mg total) by mouth at bedtime as needed for anxiety.  Marland Kitchen azelastine (ASTELIN) 0.1 % nasal spray Place 2 sprays into both nostrils 2 (two) times daily as needed for allergies.   Marland Kitchen betamethasone dipropionate (DIPROLENE) 0.05 % ointment Apply 1 application topically daily as needed (psoriasis).  . Cholecalciferol (VITAMIN D) 2000 units CAPS Take 2,000 Units by mouth daily.   Marland Kitchen dicyclomine (BENTYL) 10 MG capsule Take 1 tab by mouth every morning. May take twice daily as needed. (Patient taking differently: Take 10 mg by mouth daily. )  . diphenhydrAMINE (BENADRYL)  25 mg capsule Take 25 mg by mouth daily as needed for itching.   . diphenoxylate-atropine (LOMOTIL) 2.5-0.025 MG tablet Take 1 tablet by mouth 4 (four) times daily. (Patient taking differently: Take 1 tablet by mouth 4 (four) times daily as needed for diarrhea or loose stools. )  . diphenoxylate-atropine (LOMOTIL) 2.5-0.025 MG tablet Take 1 tablet by mouth 4 (four) times daily as needed for diarrhea or loose stools.  . DULoxetine (CYMBALTA) 60 MG capsule Take 60 mg by mouth daily.  . fluticasone (FLONASE) 50 MCG/ACT nasal spray Place 2 sprays into both nostrils 2 (two) times daily as needed (FOR NASAL CONGESTION.).   Marland Kitchen furosemide (LASIX) 20 MG tablet Take 1 tablet (20 mg total) by mouth daily.  Marland Kitchen letrozole (FEMARA) 2.5 MG tablet Take 1 tablet (2.5 mg total) by mouth daily.  Marland Kitchen levothyroxine (SYNTHROID, LEVOTHROID) 137 MCG tablet Take 137 mcg by mouth daily before breakfast. For thyroid therapy  . lidocaine-prilocaine (EMLA) cream Apply 1 application topically as needed. (Patient taking differently: Apply 1 application topically as needed (port). )  . loperamide (IMODIUM A-D) 2 MG tablet Take 2-4 mg by mouth as needed for diarrhea or loose stools.  . mirtazapine (REMERON) 15 MG tablet TAKE 1 TABLET(15 MG) BY MOUTH AT BEDTIME (Patient taking differently: Take 15 mg by mouth at bedtime. )  . oxyCODONE 10 MG TABS Take 1 tablet (10 mg total) by mouth every 6 (six) hours as needed for severe pain.  . polyvinyl alcohol (LIQUIFILM TEARS) 1.4 % ophthalmic solution Place 1 drop into both eyes 2 (two) times daily as needed for dry eyes.   Marland Kitchen  potassium chloride SA (K-DUR,KLOR-CON) 20 MEQ tablet Take 10 mEq by mouth daily.  . rivaroxaban (XARELTO) 20 MG TABS tablet Take 20 mg by mouth daily with supper.  Marland Kitchen STIOLTO RESPIMAT 2.5-2.5 MCG/ACT AERS INHALE 2 PUFFS INTO THE LUNGS DAILY  . Ustekinumab (STELARA [place]) Inject 1 Dose into the skin every 3 (three) months.  . diltiazem (CARDIZEM CD) 120 MG 24 hr capsule Take 1  capsule (120 mg total) by mouth every evening.  . diltiazem (CARDIZEM CD) 180 MG 24 hr capsule Take 1 capsule (180 mg total) by mouth every morning.  . [DISCONTINUED] Calcium Carbonate (CALCIUM 600) 1500 MG TABS Take 2 tablets by mouth daily.     No facility-administered encounter medications on file as of 08/08/2018.     Allergy:  Allergies  Allergen Reactions  . Codeine Itching    Social Hx:   Tobacco Use: Former smoker with 20-pack-year quit in 1982 Alcohol Use: Rare Illicit Drug Use: None   Past Surgical Hx:  Past Surgical History:  Procedure Laterality Date  . CARDIOVASCULAR STRESS TEST  09/23/2012   Low risk lexiscan nuclear study w/ apical thinning but no evidence of ischemia/  normal LV function and wall motion , ef 75%  . CATARACT EXTRACTION W/ INTRAOCULAR LENS  IMPLANT, BILATERAL  10/2016  . CHEST TUBE INSERTION Right 06/24/2018   Procedure: INSERTION PLEURAL DRAINAGE CATHETER;  Surgeon: Grace Isaac, MD;  Location: [place];  Service: Thoracic;  Laterality: Right;  . COLONOSCOPY    . DEBULKING N/A 03/20/2018   Procedure: DEBULKING;  Surgeon: Isabel Caprice, MD;  Location: [facility];  Service: Gynecology;  Laterality: N/A;  . EXAM UNDER ANESTHESIA WITH MANIPULATION OF KNEE Left 12-20-2003  dr Noemi Chapel   post TKA  . FEMUR IM NAIL Left 12/11/2013   Procedure: INTRAMEDULLARY (IM) NAIL FEMORAL;  Surgeon: [name], MD;  Location: [facility];  Service: Orthopedics;  Laterality: Left;  . HYSTERECTOMY ABDOMINAL WITH SALPINGO-OOPHORECTOMY Bilateral 03/20/2018   Procedure: TOTAL HYSTERECTOMY ABDOMINAL WITH BILATERAL  SALPINGO-OOPHORECTOMY;  Surgeon: Isabel Caprice, MD;  Location: [facility];  Service: Gynecology;  Laterality: Bilateral;  . IR FLUORO GUIDE PORT INSERTION RIGHT  07/22/2017  . IR PARACENTESIS  07/12/2017  . IR THORACENTESIS ASP PLEURAL SPACE W/IMG GUIDE  03/17/2018  . IR THORACENTESIS ASP PLEURAL SPACE W/IMG GUIDE  05/08/2018  . IR US GUIDE VASC ACCESS RIGHT  07/22/2017   . KNEE ARTHROSCOPY W/ LATERAL RELEASE Left 09-03-2005   dr Noemi Chapel  [facility]   w/  Lysis Adhesions,  excision loose body's  . LAPAROSCOPIC CHOLECYSTECTOMY  12-04-2010  dr zeigler  . LAPAROTOMY N/A 03/20/2018   Procedure: EXPLORATORY LAPAROTOMY;  Surgeon: Isabel Caprice, MD;  Location: [facility];  Service: Gynecology;  Laterality: N/A;  . OMENTECTOMY N/A 03/20/2018   Procedure: OMENTECTOMY;  Surgeon: Isabel Caprice, MD;  Location: [facility];  Service: Gynecology;  Laterality: N/A;  . OTHER SURGICAL HISTORY  06/24/2018   Plurex Catheter inserted into the lungs  . TOTAL KNEE ARTHROPLASTY Left 09-08-2003   dr Noemi Chapel  [facility]  . TOTAL KNEE REVISION  08/06/2012   Procedure: TOTAL KNEE REVISION;  Surgeon: [name], MD;  Location: [facility];  Service: Orthopedics;  Laterality: Left;  Left Total Knee Arthroplasty Revision  . TRANSTHORACIC ECHOCARDIOGRAM  08/20/2017   ef 60-65%,  grade 1 diastolic dysfunction/  trivial AR and TR  . Uterine polypectomy      Past Medical Hx:  Past Medical History:  Diagnosis Date  .  Anxiety   . Chronic blood loss anemia    03-04-2018 diverticular bleed and rectal bleeding,  transfused 2 units PRBCs 03-08-2018  . Colitis   . COPD (chronic obstructive pulmonary disease) ([place])    pulmologist-  dr byrum  . Depression   . Diastolic CHF, chronic ([place])    followed by cardiology  . Diverticulosis   . Dyspnea on effort   . Dysrhythmia    a-fib  . Family history of colon cancer   . Family hx of colon cancer   . Fibromyalgia   . Genetic testing 04/07/2018   MyRisk (35 genes) @ [facility] - No pathogenic mutations detected  . GERD (gastroesophageal reflux disease)    03-17-2018 per pt has not had any gerd issues in long time and no meds prn  . Hemorrhoids   . Hiatal hernia   . Hip pain 07/2018   Right Hip Pain  . History of lower GI bleeding 03/04/2018   admission--  dx diverticular bleed and rectal bleed in setting of xarelto-- xarelto stopped and transfused 03-08-2018 2  units PRBCs  . History of rectal polyps   . History of shingles   . Hypothyroidism   . IBS (irritable bowel syndrome)   . Lymphocytic colitis    FOLLOWED BY DR Henrene Pastor  . Malignant ascites    dx at admission 09/ 2018 abdominal s/p  parencentesis 07-01-2017 2.5L,  07-08-2016  2.7L,  07-12-2017  147m  . Neuropathy due to chemotherapeutic drug ([place]   . Osteoporosis   . Ovarian cancer ([facility] oncologist-  [place]/  dr pGerarda Fraction  chemotherapy 07-19-2017 to 11-04-2017  . [place]ox[place]    CARDIOLOGIST-  DR S. MCDOWELL-  first dx'd 07/ 2014---was taking xarelto up until 03-07-2018 stopped due to lower GI bleed  . Pleural effusion    s/p  right thoracentesis, 02-2018 1.3L and 03-17-2018 right thoracentesis 6420m, post cxr no residual effusion  . Psoriatic arthritis ([place]  . Schatzki's ring    S/P  DILATERAL 2013  . Seasonal allergic rhinitis     Past Gynecological History:   GYNECOLOGIC HISTORY:  No LMP recorded. Patient is postmenopausal. P 1 LMP 40s Contraceptive none HRT none    Family Hx:  Family History  Problem Relation Age of Onset  . Heart disease Father        pacemaker  . Lung cancer Father        smoker; deceased [id number]. Colon cancer Mother 6569     2nd rectal ca at 7840. Colon cancer Maternal Grandfather        dx 7036sdeceased 7063s. Depression Sister   . Leukemia Maternal Aunt        deceased [id number]  Review of Systems:  Review of Systems  Musculoskeletal: Positive for arthralgias.  All other systems reviewed and are negative.   Vitals:  Blood pressure 139/63, pulse (!) 103, temperature 98 F (36.7 C), temperature source Oral, resp. rate 20, height 5' 4"  (1.626 m), weight 162 lb 11.2 oz (73.8 kg), SpO2 96 %. Body mass index is 27.93 kg/m.  Physical Exam:  ECOG PERFORMANCE STATUS: 1 - Symptomatic but completely ambulatory   General :  Well developed, [age].o., female in no apparent distress HEENT:  Normocephalic/atraumatic,  symmetric, EOMI, eyelids normal Neck:   Supple, no masses.  Lymphatics:  No cervical/ submandibular/ supraclavicular/ infraclavicular/ inguinal adenopathy Respiratory:  Respirations unlabored, no use of accessory muscles  CV:   Deferred Breast:  Deferred Musculoskeletal: No CVA tenderness, normal muscle strength. Abdomen:  Wound healed. Firm, non-tender and nondistended. No evidence of hernia. No masses. She is guarding on exam Extremities:  No lymphedema, no erythema, non-tender. Skin:   Normal inspection Neuro/Psych:  No focal motor deficit, no abnormal mental status. Normal gait. Normal affect. Alert and oriented to person, place, and time  Genito Urinary: Vulva: Normal external female genitalia.  Bladder/urethra: Urethral meatus normal in size and location. No lesions or   masses, well supported bladder ?bladder fullness Speculum exam: Vagina: No lesion, no discharge, no bleeding. Cuff is healing well. Bimanual exam: Cervix/Uterus/Adnexa: Surgically absent  Adnexa: No masses. Rectovaginal:  Deferred    Assessment Ovarian cancer  Plan: 1. Hormone therapy per Dr. Alvy Bimler. 2. RTC 3 months for continued pelvic exams 3. Pelvic - there is some fullness almost like a full bladder. She denies needing to void. She is guarding on abdominal exam, but no definitive mass palpated. Gave daughter instruction if she starts complaining of bloating/distension to call and I'll order an abdominal sono. 4. Keep plan for CT imaging in December per Dr. Alvy Bimler  Face to face time with patient was 15 minutes. Over 50% of this time was spent on counseling and coordination of care.    Isabel Caprice, MD  08/08/2018, 11:17 AM   Cc: Asencion Noble, MD (PCP) Heath Lark, MD (Medical Oncology)

## 2018-08-08 NOTE — Patient Instructions (Signed)
Return to see Dr. Gerarda Fraction in 3 months for a pelvic

## 2018-08-13 ENCOUNTER — Other Ambulatory Visit: Payer: Self-pay | Admitting: Cardiothoracic Surgery

## 2018-08-13 DIAGNOSIS — J90 Pleural effusion, not elsewhere classified: Secondary | ICD-10-CM

## 2018-08-14 ENCOUNTER — Ambulatory Visit
Admission: RE | Admit: 2018-08-14 | Discharge: 2018-08-14 | Disposition: A | Discharge status: Home / Self Care | Payer: Medicare Other | Source: Ambulatory Visit | Attending: Cardiothoracic Surgery | Admitting: Cardiothoracic Surgery

## 2018-08-14 ENCOUNTER — Encounter: Payer: Self-pay | Admitting: Cardiothoracic Surgery

## 2018-08-14 ENCOUNTER — Ambulatory Visit (INDEPENDENT_AMBULATORY_CARE_PROVIDER_SITE_OTHER): Payer: Medicare Other | Admitting: Cardiothoracic Surgery

## 2018-08-14 ENCOUNTER — Other Ambulatory Visit: Payer: Self-pay

## 2018-08-14 VITALS — BP 130/80 | HR 100 | Resp 16 | Ht 64.0 in | Wt 162.0 lb

## 2018-08-14 DIAGNOSIS — Z9689 Presence of other specified functional implants: Secondary | ICD-10-CM | POA: Diagnosis not present

## 2018-08-14 DIAGNOSIS — J90 Pleural effusion, not elsewhere classified: Secondary | ICD-10-CM

## 2018-08-14 NOTE — Progress Notes (Signed)
[address]       ,[place] [id number]             [phone]      [place] Medical Record #[id number] Date of Birth: 02/04/1933  Referring: Collene Gobble, MD Primary Care: Asencion Noble, MD Primary Cardiologist: Rozann Lesches, MD   Chief Complaint:   POST OP FOLLOW UP 06/24/2018 PREOPERATIVE DIAGNOSIS: Recurrent right pleural effusion with history of ovarian carcinoma. POSTOPERATIVE DIAGNOSIS: Recurrent right pleural effusion with history of ovarian carcinoma. SURGICAL PROCEDURE: Placement of right PleurX catheter with ultrasound and fluoroscopic guidance History of Present Illness:     Patient continues to feel well, she notes that the Pleurx catheter has kept her out of the emergency room.  Previously she was going every 3 to 4 weeks.  The drainage has continually decreased, has become slightly blood-tinged and the amount has dropped to between 50 and 100 mL every 3 days.     Past Medical History:  Diagnosis Date  . Anxiety   . Chronic blood loss anemia    03-04-2018 diverticular bleed and rectal bleeding,  transfused 2 units PRBCs 03-08-2018  . Colitis   . COPD (chronic obstructive pulmonary disease) ([place])    pulmologist-  dr byrum  . Depression   . Diastolic CHF, chronic ([place])    followed by cardiology  . Diverticulosis   . Dyspnea on effort   . Dysrhythmia    a-fib  . Family history of colon cancer   . Family hx of colon cancer   . Fibromyalgia   . Genetic testing 04/07/2018   MyRisk (35 genes) @ [facility] - No pathogenic mutations detected  . GERD (gastroesophageal reflux disease)    03-17-2018 per pt has not had any gerd issues in long time and no meds prn  . Hemorrhoids   . Hiatal hernia   . Hip pain 07/2018   Right Hip Pain  . History of lower GI bleeding 03/04/2018   admission--  dx diverticular bleed and rectal bleed in setting of xarelto-- xarelto stopped and transfused 03-08-2018 2 units PRBCs  . History of  rectal polyps   . History of shingles   . Hypothyroidism   . IBS (irritable bowel syndrome)   . Lymphocytic colitis    FOLLOWED BY DR Henrene Pastor  . Malignant ascites    dx at admission 09/ 2018 abdominal s/p  parencentesis 07-01-2017 2.5L,  07-08-2016  2.7L,  07-12-2017  144m  . Neuropathy due to chemotherapeutic drug ([place]   . Osteoporosis   . Ovarian cancer ([facility] oncologist-  gors[place]/  dr pGerarda Fraction  chemotherapy 07-19-2017 to 11-04-2017  . [place]ox[place]mal [place]    CARDIOLOGIST-  DR S. MCDOWELL-  first dx'd 07/ 2014---was taking xarelto up until 03-07-2018 stopped due to lower GI bleed  . Pleural effusion    s/p  right thoracentesis, 02-2018 1.3L and 03-17-2018 right thoracentesis 642m, post cxr no residual effusion  . [place]atic [place] ([place]  . Schatzki's ring    S/P  DILATERAL 2013  . Seasonal allergic rhinitis      Social History   Tobacco Use  Smoking Status Former Smoker  . Packs/day: 1.00  . Years: 20.00  . Pack years: 20.00  . Types: Cigarettes  . Last attempt to quit: 10/08/1980  . Years since quitting: 37.8  Smokeless Tobacco Never Used    Social History   Substance and Sexual Activity  Alcohol Use  Yes   Comment: rarely, 12-23-15 rarely     Allergies  Allergen Reactions  . Codeine Itching    Current Outpatient Medications  Medication Sig Dispense Refill  . albuterol (PROAIR HFA) 108 (90 Base) MCG/ACT inhaler Inhale 2 puffs into the lungs every 6 (six) hours as needed for wheezing or shortness of breath. 1 Inhaler 3  . ALPRAZolam (XANAX) 0.25 MG tablet Take 1 tablet (0.25 mg total) by mouth at bedtime as needed for anxiety. 5 tablet 0  . azelastine (ASTELIN) 0.1 % nasal spray Place 2 sprays into both nostrils 2 (two) times daily as needed for allergies.     Marland Kitchen betamethasone dipropionate (DIPROLENE) 0.05 % ointment Apply 1 application topically daily as needed (psoriasis).    . Cholecalciferol (VITAMIN D) 2000 units CAPS Take 2,000 Units by  mouth daily.     Marland Kitchen dicyclomine (BENTYL) 10 MG capsule Take 1 tab by mouth every morning. May take twice daily as needed. (Patient taking differently: Take 10 mg by mouth daily. ) 60 capsule 6  . diltiazem (CARDIZEM CD) 120 MG 24 hr capsule Take 1 capsule (120 mg total) by mouth every evening. 90 capsule 3  . diltiazem (CARDIZEM CD) 180 MG 24 hr capsule Take 1 capsule (180 mg total) by mouth every morning. 90 capsule 3  . diphenhydrAMINE (BENADRYL) 25 mg capsule Take 25 mg by mouth daily as needed for itching.     . diphenoxylate-atropine (LOMOTIL) 2.5-0.025 MG tablet Take 1 tablet by mouth 4 (four) times daily. (Patient taking differently: Take 1 tablet by mouth 4 (four) times daily as needed for diarrhea or loose stools. ) 360 tablet 1  . diphenoxylate-atropine (LOMOTIL) 2.5-0.025 MG tablet Take 1 tablet by mouth 4 (four) times daily as needed for diarrhea or loose stools. 120 tablet 4  . DULoxetine (CYMBALTA) 60 MG capsule Take 60 mg by mouth daily.    . fluticasone (FLONASE) 50 MCG/ACT nasal spray Place 2 sprays into both nostrils 2 (two) times daily as needed (FOR NASAL CONGESTION.).     Marland Kitchen furosemide (LASIX) 20 MG tablet Take 1 tablet (20 mg total) by mouth daily. 90 tablet 9  . letrozole (FEMARA) 2.5 MG tablet Take 1 tablet (2.5 mg total) by mouth daily. 30 tablet 3  . levothyroxine (SYNTHROID, LEVOTHROID) 137 MCG tablet Take 137 mcg by mouth daily before breakfast. For thyroid therapy    . lidocaine-prilocaine (EMLA) cream Apply 1 application topically as needed. (Patient taking differently: Apply 1 application topically as needed (port). ) 30 g 6  . loperamide (IMODIUM A-D) 2 MG tablet Take 2-4 mg by mouth as needed for diarrhea or loose stools.    . mirtazapine (REMERON) 15 MG tablet TAKE 1 TABLET(15 MG) BY MOUTH AT BEDTIME (Patient taking differently: Take 15 mg by mouth at bedtime. ) 30 tablet 11  . oxyCODONE 10 MG TABS Take 1 tablet (10 mg total) by mouth every 6 (six) hours as needed for  severe pain. 60 tablet 0  . polyvinyl alcohol (LIQUIFILM TEARS) 1.4 % ophthalmic solution Place 1 drop into both eyes 2 (two) times daily as needed for dry eyes.     . potassium chloride SA (K-DUR,KLOR-CON) 20 MEQ tablet Take 10 mEq by mouth daily.    . rivaroxaban (XARELTO) 20 MG TABS tablet Take 20 mg by mouth daily with supper.    Marland Kitchen STIOLTO RESPIMAT 2.5-2.5 MCG/ACT AERS INHALE 2 PUFFS INTO THE LUNGS DAILY 4 g 5  . Ustekinumab (STELARA Tracy) Inject 1  Dose into the skin every 3 (three) months.     No current facility-administered medications for this visit.        Physical Exam: BP 130/80 (BP Location: Left Arm, Patient Position: Sitting, Cuff Size: Large)   Pulse 100   Resp 16   Ht 5' 4"  (1.626 m)   Wt 162 lb (73.5 kg)   SpO2 97% Comment: RA  BMI 27.81 kg/m   General appearance: alert and cooperative Neurologic: intact Heart: regular rate and rhythm, S1, S2 normal, no murmur, click, rub or gallop Lungs: clear to auscultation bilaterally Abdomen: soft, non-tender; bowel sounds normal; no masses,  no organomegaly Extremities: extremities normal, atraumatic, no cyanosis or edema and Homans sign is negative, no sign of DVT Wound: Pleurx catheter is intact without evidence of infection   Diagnostic Studies & Laboratory data:     Recent Radiology Findings:   Dg Chest 2 View  Result Date: 08/14/2018 CLINICAL DATA:  Follow-up right pleural effusion.  COPD. EXAM: CHEST - 2 VIEW COMPARISON:  07/16/2018 chest radiograph. FINDINGS: Right basilar chest tube terminates in the medial upper right pleural space, unchanged. Right internal jugular MediPort terminates in the lower third of the SVC. Stable cardiomediastinal silhouette with normal heart size. No convincing pneumothorax. No significant pleural effusion. Hyperinflated lungs. No pulmonary edema. No acute consolidative airspace disease. Stable minimal bibasilar scarring versus atelectasis. IMPRESSION: 1. Stable right chest tube with no  appreciable residual pneumothorax. 2. No significant pleural effusion. 3. Stable minimal bibasilar scarring versus atelectasis. 4. Hyperinflated lungs, compatible with the provided history of COPD. Electronically Signed   By: Ilona Sorrel M.D.   On: 08/14/2018 09:40    Dg Chest 2 View  Result Date: 08/14/2018 CLINICAL DATA:  Follow-up right pleural effusion.  COPD. EXAM: CHEST - 2 VIEW COMPARISON:  07/16/2018 chest radiograph. FINDINGS: Right basilar chest tube terminates in the medial upper right pleural space, unchanged. Right internal jugular MediPort terminates in the lower third of the SVC. Stable cardiomediastinal silhouette with normal heart size. No convincing pneumothorax. No significant pleural effusion. Hyperinflated lungs. No pulmonary edema. No acute consolidative airspace disease. Stable minimal bibasilar scarring versus atelectasis. IMPRESSION: 1. Stable right chest tube with no appreciable residual pneumothorax. 2. No significant pleural effusion. 3. Stable minimal bibasilar scarring versus atelectasis. 4. Hyperinflated lungs, compatible with the provided history of COPD. Electronically Signed   By: Ilona Sorrel M.D.   On: 08/14/2018 09:40   DCt Abdomen Pelvis W Contrast  Result Date: 07/23/2018 CLINICAL DATA:  Ovarian cancer, diagnosed 07/2015, status post surgery with adjunct chemotherapy. Oral chemotherapy ongoing. Recurrent pleural effusions. EXAM: CT ABDOMEN AND PELVIS WITH CONTRAST TECHNIQUE: Multidetector CT imaging of the abdomen and pelvis was performed using the standard protocol following bolus administration of intravenous contrast. CONTRAST:  153m OMNIPAQUE IOHEXOL 300 MG/ML  SOLN COMPARISON:  04/21/2018 FINDINGS: Lower chest: Trace right pleural effusion with indwelling pleural drain. Hepatobiliary: Liver is within normal limits. Status post cholecystectomy. No intrahepatic ductal dilatation. Dilated common duct, measuring 11 mm, chronic. Pancreas: Within normal limits.  Spleen: Within normal limits. Adrenals/Urinary Tract: Adrenal glands within normal limits. Kidneys are within normal limits.  No hydronephrosis. Bladder is underdistended but unremarkable. Stomach/Bowel: Stomach is within normal limits. No evidence of bowel obstruction. Very mild sigmoid diverticulosis. Vascular/Lymphatic: No evidence of abdominal aortic aneurysm. Atherosclerotic calcifications of the abdominal aorta and branch vessels. No suspicious abdominopelvic lymphadenopathy. Reproductive: Status post hysterectomy and bilateral salpingo-oophorectomy. Other: No abdominopelvic ascites. Mild peritoneal thickening/nodularity on the left (series  2/images 58-59). Mild soft tissue nodularity along the right pelvic cul-de-sac adjacent to the rectum (series 2/image 58). While equivocal, these findings are worrisome for very mild peritoneal disease. Musculoskeletal: Degenerative changes of the visualized thoracolumbar spine. IMPRESSION: Status post hysterectomy and bilateral salpingo-oophorectomy. Very mild peritoneal thickening/nodularity, equivocal but worrisome for very mild peritoneal disease. Attention on follow-up is suggested. Small right pleural effusion with indwelling pleural drain. Electronically Signed   By: Julian Hy M.D.   On: 07/23/2018 08:37    Recent Lab Findings: Lab Results  Component Value Date   WBC 9.4 07/22/2018   HGB 11.4 (L) 07/22/2018   HCT 35.9 (L) 07/22/2018   PLT 241 07/22/2018   GLUCOSE 119 (H) 07/22/2018   CHOL 147 11/14/2017   ALT 10 07/22/2018   AST 17 07/22/2018   NA 136 07/22/2018   K 4.2 07/22/2018   CL 104 07/22/2018   CREATININE 0.95 07/22/2018   BUN 14 07/22/2018   CO2 25 07/22/2018   TSH 0.388 11/10/2017   INR 1.09 06/24/2018   HGBA1C 5.7 (H) 06/12/2013      Assessment / Plan:      We will plan to continue with drainage at this point decreasing the interval to every 5 days, if the patient continues to have between 50 and 100 mL at each drainage  for 2 more consecutive drainages will consider removal.  She will have an appointment in the office on December 5 otherwise      Grace Isaac MD      [address] [place] [address] Office [phone]   Beeper [phone]  08/14/2018 9:53 AM

## 2018-08-17 ENCOUNTER — Other Ambulatory Visit: Payer: Self-pay | Admitting: Hematology and Oncology

## 2018-08-18 ENCOUNTER — Encounter: Payer: Self-pay | Admitting: Podiatry

## 2018-08-18 ENCOUNTER — Ambulatory Visit (INDEPENDENT_AMBULATORY_CARE_PROVIDER_SITE_OTHER): Payer: Medicare Other | Admitting: Podiatry

## 2018-08-18 VITALS — BP 112/58 | HR 96

## 2018-08-18 DIAGNOSIS — M79675 Pain in left toe(s): Secondary | ICD-10-CM | POA: Diagnosis not present

## 2018-08-18 DIAGNOSIS — B351 Tinea unguium: Secondary | ICD-10-CM | POA: Diagnosis not present

## 2018-08-18 DIAGNOSIS — L03032 Cellulitis of left toe: Secondary | ICD-10-CM

## 2018-08-18 DIAGNOSIS — M79674 Pain in right toe(s): Secondary | ICD-10-CM

## 2018-08-21 ENCOUNTER — Ambulatory Visit: Payer: Self-pay | Admitting: Cardiothoracic Surgery

## 2018-08-21 NOTE — Progress Notes (Signed)
Subjective:   Patient ID: [name], female   DOB: 84 y.o.   MRN: [id number]   HPI Patient presents all my nails bother me and it got infected and the right big toenail and getting some irritation in the corner and the left big toenail and noted some drainage.  Patient states he thinks things have changed since she was on chemo   Review of Systems  All other systems reviewed and are negative.       Objective:  Physical Exam  Constitutional: She appears well-developed and well-nourished.  Cardiovascular: Intact distal pulses.  Pulmonary/Chest: Effort normal.  Musculoskeletal: Normal range of motion.  Neurological: She is alert.  Skin: Skin is warm.  Nursing note and vitals reviewed.   Vascular status found to be intact with diminishment of sharp dull vibratory secondary to probable chemo and neuropathy.  Patient has on the left big toe drainage of the lateral border local with redness and mild necrosis but no proximal edema erythema or drainage noted and was found to have thick yellow brittle nailbeds 1-5 both feet that are painful     Assessment:  Paronychia infection left hallux with mycotic nail infection 1-5 both feet     Plan:  H&P both conditions reviewed and I recommended correction of the nail by removing the corner allowing drainage to occur.  I infiltrated 60 mill grams like Marcaine mixture under sterile conditions I remove the border I removed all proud flesh abscess tissue flush the area and allow channel for drainage and applied sterile dressing.  Instructed on soaks and then debrided nailbeds 1-5 both feet with no iatrogenic bleeding noted and will be seen back for routine care or earlier if symptoms continue left big toe

## 2018-08-27 ENCOUNTER — Encounter (HOSPITAL_COMMUNITY): Payer: Self-pay

## 2018-08-27 ENCOUNTER — Encounter (HOSPITAL_COMMUNITY)
Admission: RE | Admit: 2018-08-27 | Discharge: 2018-08-27 | Disposition: A | Discharge status: Home / Self Care | Payer: Medicare Other | Source: Ambulatory Visit | Attending: Emergency Medicine | Admitting: Emergency Medicine

## 2018-08-27 VITALS — BP 120/50 | HR 92 | Ht 64.0 in | Wt 172.4 lb

## 2018-08-27 DIAGNOSIS — J431 Panlobular emphysema: Secondary | ICD-10-CM | POA: Insufficient documentation

## 2018-08-27 NOTE — Progress Notes (Signed)
Daily Session Note  Patient Details  Name: ARIYEL JEANGILLES MRN: [id number] Date of Birth: 1933-01-08 Referring Provider:    Encounter Date: 08/27/2018  Check In: Session Check In - 08/27/18 0800      Check-In   Supervising physician immediately available to respond to emergencies  See telemetry face sheet for immediately available MD    Location  [facility]    Staff Present  Russella Dar, MS, EP, [facility], Exercise Physiologist;Debra Wynetta Emery, RN, Cory Munch, Exercise Physiologist    Medication changes reported      No    Fall or balance concerns reported     Yes    Comments  Uses a walker    Tobacco Cessation  --   Never Smoked   Warm-up and Cool-down  Performed as group-led instruction    Resistance Training Performed  Yes    VAD Patient?  No    PAD/SET Patient?  No      Pain Assessment   Currently in Pain?  No/denies    Pain Score  0-No pain    Multiple Pain Sites  No       Capillary Blood Glucose: No results found for this or any previous visit (from the past 24 hour(s)).    Social History   Tobacco Use  Smoking Status Former Smoker  . Packs/day: 1.00  . Years: 20.00  . Pack years: 20.00  . Types: Cigarettes  . Last attempt to quit: 10/08/1980  . Years since quitting: 37.9  Smokeless Tobacco Never Used    Goals Met:  Proper associated with RPD/PD & O2 Sat Independence with exercise equipment Exercise tolerated well Personal goals reviewed No report of cardiac concerns or symptoms Strength training completed today  Goals Unmet:  Not Applicable  Comments: Check out: 1015   Dr. Sinda Du is Medical Director for [facility].

## 2018-08-27 NOTE — Progress Notes (Signed)
Pulmonary Individual Treatment Plan  Patient Details  Name: [name] MRN: [id number] Date of Birth: [birth year] Referring Provider:     [place] from 08/27/2018 in [place]  Referring Provider  Byrum       Initial Encounter Date:    CARDIAC REHAB PHASE II ORIENTATION from 08/27/2018 in [place]  Date  08/27/18      Visit Diagnosis: Panlobular emphysema ([place])  Patient's Home Medications on Admission:   Current Outpatient Medications:  .  albuterol (PROAIR HFA) 108 (90 Base) MCG/ACT inhaler, Inhale 2 puffs into the lungs every 6 (six) hours as needed for wheezing or shortness of breath., Disp: 1 Inhaler, Rfl: 3 .  ALPRAZolam (XANAX) 0.25 MG tablet, Take 1 tablet (0.25 mg total) by mouth at bedtime as needed for anxiety., Disp: 5 tablet, Rfl: 0 .  amoxicillin (AMOXIL) 500 MG capsule, TAKE 4 CAPSULES PO 1 HOUR PRIOR TO DENTAL APPOINTMENT, Disp: , Rfl: 1 .  azelastine (ASTELIN) 0.1 % nasal spray, Place 2 sprays into both nostrils 2 (two) times daily as needed for allergies. , Disp: , Rfl:  .  Cholecalciferol (VITAMIN D) 2000 units CAPS, Take 2,000 Units by mouth daily. , Disp: , Rfl:  .  dicyclomine (BENTYL) 10 MG capsule, Take 1 tab by mouth every morning. May take twice daily as needed. (Patient taking differently: Take 10 mg by mouth 2 (two) times daily. ), Disp: 60 capsule, Rfl: 6 .  diltiazem (CARDIZEM CD) 120 MG 24 hr capsule, Take 1 capsule (120 mg total) by mouth every evening. (Patient taking differently: Take 120 mg by mouth every evening. ), Disp: 90 capsule, Rfl: 3 .  diltiazem (CARDIZEM CD) 180 MG 24 hr capsule, Take 1 capsule (180 mg total) by mouth every morning. (Patient taking differently: Take 180 mg by mouth every morning. ), Disp: 90 capsule, Rfl: 3 .  diphenoxylate-atropine (LOMOTIL) 2.5-0.025 MG tablet, Take 1 tablet by mouth 4 (four) times daily. (Patient taking differently: Take 1 tablet  by mouth 4 (four) times daily as needed for diarrhea or loose stools. ), Disp: 360 tablet, Rfl: 1 .  DULoxetine (CYMBALTA) 60 MG capsule, Take 30 mg by mouth daily. , Disp: , Rfl:  .  fluticasone (FLONASE) 50 MCG/ACT nasal spray, Place 2 sprays into both nostrils 2 (two) times daily as needed (FOR NASAL CONGESTION.). , Disp: , Rfl:  .  furosemide (LASIX) 20 MG tablet, Take 1 tablet (20 mg total) by mouth daily., Disp: 90 tablet, Rfl: 9 .  halobetasol (ULTRAVATE) 0.05 % cream, Apply 1 application topically 2 (two) times daily as needed (psoriasis). , Disp: , Rfl:  .  letrozole (FEMARA) 2.5 MG tablet, TAKE 1 TABLET(2.5 MG) BY MOUTH DAILY, Disp: 30 tablet, Rfl: 0 .  levothyroxine (SYNTHROID, LEVOTHROID) 137 MCG tablet, Take 137 mcg by mouth daily before breakfast. For thyroid therapy, Disp: , Rfl:  .  lidocaine-prilocaine (EMLA) cream, Apply 1 application topically as needed. (Patient taking differently: Apply 1 application topically as needed (port). ), Disp: 30 g, Rfl: 6 .  mirtazapine (REMERON) 15 MG tablet, TAKE 1 TABLET(15 MG) BY MOUTH AT BEDTIME (Patient taking differently: Take 15 mg by mouth at bedtime. ), Disp: 30 tablet, Rfl: 11 .  oxyCODONE 10 MG TABS, Take 1 tablet (10 mg total) by mouth every 6 (six) hours as needed for severe pain., Disp: 60 tablet, Rfl: 0 .  polyvinyl alcohol (LIQUIFILM TEARS) 1.4 % ophthalmic solution, Place 1 drop  into both eyes 2 (two) times daily as needed for dry eyes. , Disp: , Rfl:  .  potassium chloride SA (K-DUR,KLOR-CON) 20 MEQ tablet, Take 10 mEq by mouth daily., Disp: , Rfl:  .  rivaroxaban (XARELTO) 20 MG TABS tablet, Take 20 mg by mouth daily with supper., Disp: , Rfl:  .  STIOLTO RESPIMAT 2.5-2.5 MCG/ACT AERS, INHALE 2 PUFFS INTO THE LUNGS DAILY, Disp: 4 g, Rfl: 5 .  betamethasone dipropionate (DIPROLENE) 0.05 % ointment, Apply 1 application topically daily as needed (psoriasis)., Disp: , Rfl:  .  dexamethasone (DECADRON) 4 MG tablet, dexamethasone 4 mg  tablet, Disp: , Rfl:  .  diphenhydrAMINE (BENADRYL) 25 mg capsule, Take 25 mg by mouth daily as needed for itching. , Disp: , Rfl:  .  enoxaparin (LOVENOX) 40 MG/0.4ML injection, enoxaparin 40 mg/0.4 mL subcutaneous syringe  INJECT CONTENTS OF 1 SYRINGE INTO THE SKIN ONCE DAILY, Disp: , Rfl:  .  HYDROcodone-acetaminophen (NORCO) 10-325 MG tablet, hydrocodone 10 mg-acetaminophen 325 mg tablet, Disp: , Rfl:  .  loperamide (IMODIUM A-D) 2 MG tablet, Take 2-4 mg by mouth as needed for diarrhea or loose stools., Disp: , Rfl:  .  morphine (MSIR) 15 MG tablet, morphine 15 mg immediate release tablet, Disp: , Rfl:  .  nystatin (MYCOSTATIN) 100000 UNIT/ML suspension, nystatin 100,000 unit/mL oral suspension, Disp: , Rfl:  .  ondansetron (ZOFRAN) 8 MG tablet, ondansetron HCl 8 mg tablet, Disp: , Rfl:  .  sulfamethoxazole-trimethoprim (BACTRIM DS,SEPTRA DS) 800-160 MG tablet, sulfamethoxazole 800 mg-trimethoprim 160 mg tablet  TK 1 T PO BID, Disp: , Rfl:  .  Ustekinumab (STELARA Bowie), Inject 1 Dose into the skin every 3 (three) months., Disp: , Rfl:   Past Medical History: Past Medical History:  Diagnosis Date  . Anxiety   . Chronic blood loss anemia    03-04-2018 diverticular bleed and rectal bleeding,  transfused 2 units PRBCs 03-08-2018  . Colitis   . COPD (chronic obstructive pulmonary disease) ([place])    pulmologist-  dr byrum  . Depression   . Diastolic CHF, chronic ([place])    followed by cardiology  . Diverticulosis   . Dyspnea on effort   . Dysrhythmia    a-fib  . Family history of colon cancer   . Family hx of colon cancer   . Fibromyalgia   . Genetic testing 04/07/2018   MyRisk (35 genes) @ [facility] - No pathogenic mutations detected  . GERD (gastroesophageal reflux disease)    03-17-2018 per pt has not had any gerd issues in long time and no meds prn  . Hemorrhoids   . Hiatal hernia   . Hip pain 07/2018   Right Hip Pain  . History of lower GI bleeding 03/04/2018   admission--  dx  diverticular bleed and rectal bleed in setting of xarelto-- xarelto stopped and transfused 03-08-2018 2 units PRBCs  . History of rectal polyps   . History of shingles   . Hypothyroidism   . IBS (irritable bowel syndrome)   . Lymphocytic colitis    FOLLOWED BY DR Henrene Pastor  . Malignant ascites    dx at admission 09/ 2018 abdominal s/p  parencentesis 07-01-2017 2.5L,  07-08-2016  2.7L,  07-12-2017  1472m  . Neuropathy due to chemotherapeutic drug ([place]   . Osteoporosis   . Ovarian cancer ([facility] oncologist-  [place]/  dr pGerarda Fraction  chemotherapy 07-19-2017 to 11-04-2017  . Paroxysmal atrial fibrillation ([place]    CARDIOLOGIST-  DR S. MCDOWELL-  first dx'd 07/ 2014---was taking xarelto up until 03-07-2018 stopped due to lower GI bleed  . Pleural effusion    s/p  right thoracentesis, 02-2018 1.3L and 03-17-2018 right thoracentesis 62m , post cxr no residual effusion  . Psoriatic arthritis ([place]   . Schatzki's ring    S/P  DILATERAL 2013  . Seasonal allergic rhinitis     Tobacco Use: Social History   Tobacco Use  Smoking Status Former Smoker  . Packs/day: 1.00  . Years: 20.00  . Pack years: 20.00  . Types: Cigarettes  . Last attempt to quit: 10/08/1980  . Years since quitting: 37.9  Smokeless Tobacco Never Used    Labs: Recent RChemical engineer   Labs for ITP Cardiac and Pulmonary Rehab Latest Ref Rng & Units 06/12/2013 11/14/2017   Cholestrol 0 - 200 mg/dL - 147   Hemoglobin A1c <5.7 % 5.7(H) -      Capillary Blood Glucose: Lab Results  Component Value Date   GLUCAP 102 (H) 03/07/2018   GLUCAP 100 (H) 03/07/2018   GLUCAP 111 (H) 03/06/2018   GLUCAP 114 (H) 03/05/2018   GLUCAP 100 (H) 03/05/2018     Pulmonary Assessment Scores: Pulmonary Assessment Scores    Row Name 08/27/18 1058         ADL UCSD   ADL Phase  Entry     SOB Score total  73     Rest  1     Walk  12     Stairs  5     Bath  3     Dress  4     Shop  5       CAT Score   CAT Score  22        mMRC Score   mMRC Score  3        Pulmonary Function Assessment: Pulmonary Function Assessment - 08/27/18 1047      Pulmonary Function Tests   FVC%  1.77 %    FEV1%  1.84 %    FEV1/FVC Ratio  73      Initial Spirometry Results   FVC%  1.77 %    FEV1%  1.84 %    FEV1/FVC Ratio  73      Post Bronchodilator Spirometry Results   FVC%  72 %    FEV1%  52 %    FEV1/FVC Ratio  54      Breath   Bilateral Breath Sounds  Clear    Shortness of Breath  Yes;Limiting activity       Exercise Target Goals: Exercise Program Goal: Individual exercise prescription set using results from initial 6 min walk test and THRR while considering  patient's activity barriers and safety.   Exercise Prescription Goal: Initial exercise prescription builds to 30-45 minutes a day of aerobic activity, 2-3 days per week.  Home exercise guidelines will be given to patient during program as part of exercise prescription that the participant will acknowledge.  Activity Barriers & Risk Stratification: Activity Barriers & Cardiac Risk Stratification - 08/27/18 1040      Activity Barriers & Cardiac Risk Stratification   Activity Barriers  Right Knee Replacement;Shortness of Breath;Muscular Weakness;Other (comment)    Comments  L hip pain     Cardiac Risk Stratification  Moderate       6 Minute Walk: 6 Minute Walk    Row Name 08/27/18 1038         6 Minute Walk   Phase  Initial     Distance  600 feet     Walk Time  5 minutes     # of Rest Breaks  1 got tired      MPH  1.14     METS  1.87     RPE  13     Perceived Dyspnea   13     VO2 Peak  4.81     Symptoms  Yes (comment)     Comments  5/10 bilateral buttocks pain      Resting HR  92 bpm     Resting BP  120/50     Resting Oxygen Saturation   95 %     Exercise Oxygen Saturation  during 6 min walk  94 %     Max Ex. HR  135 bpm     Max Ex. BP  154/52     2 Minute Post BP  130/54        Oxygen Initial Assessment: Oxygen Initial Assessment  - 08/27/18 1046      Home Oxygen   Home Oxygen Device  None    Sleep Oxygen Prescription  None    Home Exercise Oxygen Prescription  None    Home at Rest Exercise Oxygen Prescription  None      Initial 6 min Walk   Oxygen Used  None      Program Oxygen Prescription   Program Oxygen Prescription  None       Oxygen Re-Evaluation:   Oxygen Discharge (Final Oxygen Re-Evaluation):   Initial Exercise Prescription: Initial Exercise Prescription - 08/27/18 1000      Date of Initial Exercise RX and Referring Provider   Date  08/27/18    Referring Provider  Byrum     Expected Discharge Date  11/27/18      Treadmill   MPH  0.8    Grade  0    Minutes  17    METs  1.61      NuStep   Level  1    SPM  48    Minutes  17    METs  1.9      Prescription Details   Frequency (times per week)  2    Duration  Progress to 30 minutes of continuous aerobic without signs/symptoms of physical distress      Intensity   THRR 40-80% of Max Heartrate  110-119-128    Ratings of Perceived Exertion  11-13    Perceived Dyspnea  0-4      Progression   Progression  Continue to progress workloads to maintain intensity without signs/symptoms of physical distress.      Resistance Training   Training Prescription  Yes    Weight  1    Reps  10-15       Perform Capillary Blood Glucose checks as needed.  Exercise Prescription Changes:   Exercise Comments:   Exercise Goals and Review:  Exercise Goals    Row Name 08/27/18 1042             Exercise Goals   Increase Physical Activity  Yes       Intervention  Provide advice, education, support and counseling about physical activity/exercise needs.;Develop an individualized exercise prescription for aerobic and resistive training based on initial evaluation findings, risk stratification, comorbidities and participant's personal goals.       Expected Outcomes  Short Term: Attend rehab on a regular basis to increase amount of physical  activity.  Increase Strength and Stamina  Yes       Intervention  Provide advice, education, support and counseling about physical activity/exercise needs.;Develop an individualized exercise prescription for aerobic and resistive training based on initial evaluation findings, risk stratification, comorbidities and participant's personal goals.       Expected Outcomes  Short Term: Increase workloads from initial exercise prescription for resistance, speed, and METs.       Able to understand and use rate of perceived exertion (RPE) scale  Yes       Intervention  Provide education and explanation on how to use RPE scale       Expected Outcomes  Short Term: Able to use RPE daily in rehab to express subjective intensity level;Long Term:  Able to use RPE to guide intensity level when exercising independently       Able to understand and use Dyspnea scale  Yes       Intervention  Provide education and explanation on how to use Dyspnea scale       Expected Outcomes  Short Term: Able to use Dyspnea scale daily in rehab to express subjective sense of shortness of breath during exertion;Long Term: Able to use Dyspnea scale to guide intensity level when exercising independently       Knowledge and understanding of Target Heart Rate Range (THRR)  Yes       Intervention  Provide education and explanation of THRR including how the numbers were predicted and where they are located for reference       Expected Outcomes  Short Term: Able to state/look up THRR;Long Term: Able to use THRR to govern intensity when exercising independently;Short Term: Able to use daily as guideline for intensity in rehab       Able to check pulse independently  Yes       Intervention  Provide education and demonstration on how to check pulse in carotid and radial arteries.;Review the importance of being able to check your own pulse for safety during independent exercise       Expected Outcomes  Short Term: Able to explain why pulse  checking is important during independent exercise;Long Term: Able to check pulse independently and accurately       Understanding of Exercise Prescription  Yes       Intervention  Provide education, explanation, and written materials on patient's individual exercise prescription       Expected Outcomes  Short Term: Able to explain program exercise prescription;Long Term: Able to explain home exercise prescription to exercise independently          Exercise Goals Re-Evaluation :   Discharge Exercise Prescription (Final Exercise Prescription Changes):   Nutrition:  Target Goals: Understanding of nutrition guidelines, daily intake of sodium <1569m, cholesterol <2068m calories 30% from fat and 7% or less from saturated fats, daily to have 5 or more servings of fruits and vegetables.  Biometrics: Pre Biometrics - 08/27/18 1043      Pre Biometrics   Height  5' 4"  (1.626 m)    Weight  172 lb 6.4 oz (78.2 kg)    Waist Circumference  41.5 inches    Hip Circumference  44 inches    Waist to Hip Ratio  0.94 %    BMI (Calculated)  29.58    Triceps Skinfold  16 mm    % Body Fat  40.5 %    Grip Strength  17.2 kg    Flexibility  0 in    Single Leg  Stand  13 seconds        Nutrition Therapy Plan and Nutrition Goals: Nutrition Therapy & Goals - 08/27/18 1101      Personal Nutrition Goals   Personal Goal #2  Patient is eating low sodium, and heart healthy diet    Additional Goals?  No       Nutrition Assessments: Nutrition Assessments - 08/27/18 1102      MEDFICTS Scores   Pre Score  33       Nutrition Goals Re-Evaluation:   Nutrition Goals Discharge (Final Nutrition Goals Re-Evaluation):   Psychosocial: Target Goals: Acknowledge presence or absence of significant depression and/or stress, maximize coping skills, provide positive support system. Participant is able to verbalize types and ability to use techniques and skills needed for reducing stress and  depression.  Initial Review & Psychosocial Screening: Initial Psych Review & Screening - 08/27/18 1100      Initial Review   Current issues with  Current Depression   Just a little depressed about her health.     Family Dynamics   Good Support System?  Yes      Barriers   Psychosocial barriers to participate in program  The patient should benefit from training in stress management and relaxation.;Psychosocial barriers identified (see note)   Feeling depressed due to her health     Screening Interventions   Interventions  Encouraged to exercise    Expected Outcomes  Short Term goal: Identification and review with participant of any Quality of Life or Depression concerns found by scoring the questionnaire.;Long Term goal: The participant improves quality of Life and PHQ9 Scores as seen by post scores and/or verbalization of changes       Quality of Life Scores: Quality of Life - 08/27/18 1044      Quality of Life   Select  Quality of Life      Quality of Life Scores   Health/Function Pre  14.8 %    Socioeconomic Pre  22.8 %    Psych/Spiritual Pre  23.14 %    Family Pre  30 %    GLOBAL Pre  20.25 %      Scores of 19 and below usually indicate a poorer quality of life in these areas.  A difference of  2-3 points is a clinically meaningful difference.  A difference of 2-3 points in the total score of the Quality of Life Index has been associated with significant improvement in overall quality of life, self-image, physical symptoms, and general health in studies assessing change in quality of life.   PHQ-9: Recent Review Flowsheet Data    Depression screen [facility] 2/9 08/27/2018 12/18/2017   Decreased Interest 0 1   Down, Depressed, Hopeless 0 3   PHQ - 2 Score 0 4   Altered sleeping 1 3   Tired, decreased energy 2 3   Change in appetite 1 0   Feeling bad or failure about yourself  2 0   Trouble concentrating 1 1   Moving slowly or fidgety/restless 0 0   Suicidal thoughts - 0    PHQ-9 Score 7 11   Difficult doing work/chores Somewhat difficult Somewhat difficult     Interpretation of Total Score  Total Score Depression Severity:  1-4 = Minimal depression, 5-9 = Mild depression, 10-14 = Moderate depression, 15-19 = Moderately severe depression, 20-27 = Severe depression   Psychosocial Evaluation and Intervention: Psychosocial Evaluation - 08/27/18 1101      Psychosocial Evaluation & Interventions  Interventions  Encouraged to exercise with the program and follow exercise prescription;Stress management education    Continue Psychosocial Services   Follow up required by staff       Psychosocial Re-Evaluation:   Psychosocial Discharge (Final Psychosocial Re-Evaluation):    Education: Education Goals: Education classes will be provided on a weekly basis, covering required topics. Participant will state understanding/return demonstration of topics presented.  Learning Barriers/Preferences: Learning Barriers/Preferences - 08/27/18 1005      Learning Barriers/Preferences   Learning Barriers  None    Learning Preferences  Individual Instruction;Group Instruction;Verbal Instruction;Skilled Demonstration       Education Topics: How Lungs Work and Diseases: - Discuss the anatomy of the lungs and diseases that can affect the lungs, such as COPD.   Exercise: -Discuss the importance of exercise, FITT principles of exercise, normal and abnormal responses to exercise, and how to exercise safely.   Environmental Irritants: -Discuss types of environmental irritants and how to limit exposure to environmental irritants.   Meds/Inhalers and oxygen: - Discuss respiratory medications, definition of an inhaler and oxygen, and the proper way to use an inhaler and oxygen.   Energy Saving Techniques: - Discuss methods to conserve energy and decrease shortness of breath when performing activities of daily living.    Bronchial Hygiene / Breathing  Techniques: - Discuss breathing mechanics, pursed-lip breathing technique,  proper posture, effective ways to clear airways, and other functional breathing techniques   Cleaning Equipment: - Provides group verbal and written instruction about the health risks of elevated stress, cause of high stress, and healthy ways to reduce stress.   Nutrition I: Fats: - Discuss the types of cholesterol, what cholesterol does to the body, and how cholesterol levels can be controlled.   Nutrition II: Labels: -Discuss the different components of food labels and how to read food labels.   Respiratory Infections: - Discuss the signs and symptoms of respiratory infections, ways to prevent respiratory infections, and the importance of seeking medical treatment when having a respiratory infection.   Stress I: Signs and Symptoms: - Discuss the causes of stress, how stress may lead to anxiety and depression, and ways to limit stress.   Stress II: Relaxation: -Discuss relaxation techniques to limit stress.   Oxygen for Home/Travel: - Discuss how to prepare for travel when on oxygen and proper ways to transport and store oxygen to ensure safety.   Knowledge Questionnaire Score: Knowledge Questionnaire Score - 08/27/18 1006      Knowledge Questionnaire Score   Pre Score  16/18       Core Components/Risk Factors/Patient Goals at Admission: Personal Goals and Risk Factors at Admission - 08/27/18 1102      Core Components/Risk Factors/Patient Goals on Admission    Weight Management  Weight Maintenance    Personal Goal Other  Yes    Personal Goal  Return to shopping, become more independent and be able to go places by herself.     Intervention  Attend program 2 x week and supplement with at home exercise 3 x week.     Expected Outcomes  Reach personal goals.        Core Components/Risk Factors/Patient Goals Review:    Core Components/Risk Factors/Patient Goals at Discharge (Final Review):     ITP Comments: ITP Comments    Row Name 08/27/18 1043           ITP Comments  [name] is a pleasant 84 year female who has been through physical therapy to get  stronger for pulmonary rehab.           Comments: Patient arrived for 1st visit/orientation/education at 0800. Patient was referred to PR by Dr. Lamonte Sakai due to Panlobular Emphysema (J43.1). During orientation advised patient on arrival and appointment times what to wear, what to do before, during and after exercise. Reviewed attendance and class policy. Talked about inclement weather and class consultation policy. Pt is scheduled to return Pulmonary Rehab on 08/28/18 at 1045. Pt was advised to come to class 15 minutes before class starts. Patient was also given instructions on meeting with the dietician and attending the Family Structure classes. Discussed RPE/Dpysnea scales. Discussed initial THR and how to find their radial and/or carotid pulse. Discussed the initial exercise prescription and how this effects their progress. Pt is eager to get started. Patient participated in warm up stretches followed by light weights and resistance bands. Patient was able to complete 6 minute walk test by doing only 5 minutes. Patient c/o bilateralpain in hedr buttocks 5/10 during walk test. Pain subsided to 0/10 at 2 minute rest. Pain was completely gone at the end of orientation. Patient was measured for the equipment. Discussed equipment safety with patient. Took patient pre-anthropometric measurements. Patient finished visit at 10:15.

## 2018-08-28 ENCOUNTER — Encounter (HOSPITAL_COMMUNITY)
Admission: RE | Admit: 2018-08-28 | Discharge: 2018-08-28 | Disposition: A | Discharge status: Home / Self Care | Payer: Medicare Other | Source: Ambulatory Visit | Attending: Emergency Medicine | Admitting: Emergency Medicine

## 2018-08-28 DIAGNOSIS — J431 Panlobular emphysema: Secondary | ICD-10-CM

## 2018-08-28 NOTE — Progress Notes (Signed)
Daily Session Note  Patient Details  Name: [name] MRN: [id number] Date of Birth: 1933-08-02 Referring Provider:     [place] from 08/27/2018 in [place]  Referring Provider  [place]       Encounter Date: 08/28/2018  Check In: Session Check In - 08/28/18 1045      Check-In   Supervising physician immediately available to respond to emergencies  See telemetry face sheet for immediately available MD    Location  [facility]    Staff Present  Russella Dar, MS, EP, [facility], Exercise Physiologist;Aritzel Krusemark Wynetta Emery, RN, Cory Munch, Exercise Physiologist    Medication changes reported      No    Fall or balance concerns reported     Yes    Comments  Uses a walker    Warm-up and Cool-down  Performed as group-led instruction    Resistance Training Performed  Yes    VAD Patient?  No    PAD/SET Patient?  No      Pain Assessment   Currently in Pain?  No/denies    Pain Score  0-No pain    Multiple Pain Sites  No       Capillary Blood Glucose: No results found for this or any previous visit (from the past 24 hour(s)).    Social History   Tobacco Use  Smoking Status Former Smoker  . Packs/day: 1.00  . Years: 20.00  . Pack years: 20.00  . Types: Cigarettes  . Last attempt to quit: 10/08/1980  . Years since quitting: 37.9  Smokeless Tobacco Never Used    Goals Met:  Proper associated with RPD/PD & O2 Sat Independence with exercise equipment Improved SOB with ADL's Using PLB without cueing & demonstrates good technique Exercise tolerated well No report of cardiac concerns or symptoms Strength training completed today  Goals Unmet:  Not Applicable  Comments: Pt able to follow exercise prescription today without complaint.  Will continue to monitor for progression. Check out 1145.   Dr. Sinda Du is Medical Director for [facility].

## 2018-09-01 ENCOUNTER — Other Ambulatory Visit: Payer: Self-pay | Admitting: Obstetrics

## 2018-09-01 DIAGNOSIS — R1084 Generalized abdominal pain: Secondary | ICD-10-CM

## 2018-09-01 DIAGNOSIS — R140 Abdominal distension (gaseous): Secondary | ICD-10-CM

## 2018-09-01 DIAGNOSIS — C569 Malignant neoplasm of unspecified ovary: Secondary | ICD-10-CM

## 2018-09-02 ENCOUNTER — Other Ambulatory Visit: Payer: Self-pay | Admitting: Gynecologic Oncology

## 2018-09-02 ENCOUNTER — Encounter (HOSPITAL_COMMUNITY): Payer: Medicare Other

## 2018-09-02 ENCOUNTER — Telehealth: Payer: Self-pay | Admitting: *Deleted

## 2018-09-02 DIAGNOSIS — R140 Abdominal distension (gaseous): Secondary | ICD-10-CM

## 2018-09-02 NOTE — Progress Notes (Signed)
Cardiology Office Note  Date: 09/03/2018   ID: [name], DOB 07-05-1933, MRN [id number]  PCP: Asencion Noble, MD  Primary Cardiologist: Rozann Lesches, MD   Chief Complaint  Patient presents with  . Atrial Fibrillation     History of Present Illness: [name] is an 84 y.o. female last seen in August.  She is here today with her daughter for a follow-up visit.  She does not report any significant palpitations or chest pain.  She still has a Pleurx catheter in place with recurring right pleural effusion, also has a history of ascites.  Daughter states that they have been actually getting more fluid out when draining the catheter recently.  I note that her weight is up as well.  States that she has been taking Lasix 20 mg daily with potassium supplement.  I reviewed her most recent lab work as outlined below.  Currently, hemoglobin has been relatively stable.  She does not report any obvious blood in her stools.  I reviewed her heart rate control regimen, she remains on Cardizem CD as noted below.  Past Medical History:  Diagnosis Date  . Anxiety   . Chronic blood loss anemia    03-04-2018 diverticular bleed and rectal bleeding,  transfused 2 units PRBCs 03-08-2018  . Colitis   . COPD (chronic obstructive pulmonary disease) (HCC)    Dr. Lamonte Sakai  . Depression   . Diastolic CHF, chronic ([place])   . Diverticulosis   . Family history of colon cancer   . Fibromyalgia   . Genetic testing 04/07/2018   MyRisk (35 genes) @ [facility] - No pathogenic mutations detected  . GERD (gastroesophageal reflux disease)   . Hemorrhoids   . Hiatal hernia   . Hip pain 07/2018   Right Hip Pain  . History of rectal polyps   . History of shingles   . Hypothyroidism   . IBS (irritable bowel syndrome)   . Lymphocytic colitis    Dr. Henrene Pastor  . Malignant ascites    Admission 06/2017 abdominal s/p parencentesis 07-01-2017 2.5L, 07-08-2016  2.7L, 07-12-2017  1495m  . Neuropathy due to  chemotherapeutic drug ([place]   . Osteoporosis   . [facility]varian [facility] ([facility]    Chemotherapy - Dr. GAlvy Bimler . [place]    Xarelto stopped 03-07-2018 due to lower GI bleed  . Pleural effusion    s/p  right thoracentesis, 02-2018 1.3L and 03-17-2018 right thoracentesis 6474m, post cxr no residual effusion  . [place]soriatic arthritis ([place]  . Schatzki's ring 2013  . Seasonal allergic rhinitis     Past Surgical History:  Procedure Laterality Date  . CARDIOVASCULAR STRESS TEST  09/23/2012   Low risk lexiscan nuclear study w/ apical thinning but no evidence of ischemia/  normal LV function and wall motion , ef 75%  . CATARACT EXTRACTION W/ INTRAOCULAR LENS  IMPLANT, BILATERAL  10/2016  . CHEST TUBE INSERTION Right 06/24/2018   Procedure: INSERTION PLEURAL DRAINAGE CATHETER;  Surgeon: GeGrace IsaacMD;  [place] Service: Thoracic;  Laterality: Right;  . COLONOSCOPY    . DEBULKING N/A 03/20/2018   Procedure: DEBULKING;  Surgeon: PhIsabel CapriceMD;  Location: [facility];  Service: Gynecology;  Laterality: N/A;  . EXAM UNDER ANESTHESIA WITH MANIPULATION OF KNEE Left 12-20-2003  dr waNoemi Chapel post TKA  . FEMUR IM NAIL Left 12/11/2013   Procedure: INTRAMEDULLARY (IM) NAIL FEMORAL;  Surgeon: FrGearlean AlfMD;  Location:  [facility];  Service: Orthopedics;  Laterality: Left;  . HYSTERECTOMY ABDOMINAL WITH SALPINGO-OOPHORECTOMY Bilateral 03/20/2018   Procedure: TOTAL HYSTERECTOMY ABDOMINAL WITH BILATERAL  SALPINGO-OOPHORECTOMY;  Surgeon: Isabel Caprice, MD;  [facility]: [facility];  Service: Gynecology;  Laterality: Bilateral;  . IR FLUORO GUIDE PORT INSERTION RIGHT  07/22/2017  . IR PARACENTESIS  07/12/2017  . IR THORACENTESIS ASP PLEURAL SPACE W/IMG GUIDE  03/17/2018  . IR THORACENTESIS ASP PLEURAL SPACE W/IMG GUIDE  05/08/2018  . IR US GUIDE VASC ACCESS RIGHT  07/22/2017  . KNEE ARTHROSCOPY W/ LATERAL RELEASE Left 09-03-2005   dr Noemi Chapel  [facility]   w/  Lysis Adhesions,  excision  loose body's  . LAPAROSCOPIC CHOLECYSTECTOMY  12-04-2010  dr zeigler  . LAPAROTOMY N/A 03/20/2018   Procedure: EXPLORATORY LAPAROTOMY;  Surgeon: Isabel Caprice, MD;  [facility]: [facility];  Service: Gynecology;  Laterality: N/A;  . OMENTECTOMY N/A 03/20/2018   Procedure: OMENTECTOMY;  Surgeon: Isabel Caprice, MD;  [facility];  Service: Gynecology;  Laterality: N/A;  . OTHER SURGICAL HISTORY  06/24/2018   Plurex Catheter inserted into the lungs  . TOTAL KNEE ARTHROPLASTY Left 09-08-2003   dr Noemi Chapel  [facility]  . TOTAL KNEE REVISION  08/06/2012   Procedure: TOTAL KNEE REVISION;  Surgeon: [name], MD;  [facility]: [facility];  Service: Orthopedics;  Laterality: Left;  Left Total Knee Arthroplasty Revision  . TRANSTHORACIC ECHOCARDIOGRAM  08/20/2017   ef 60-65%,  grade 1 diastolic dysfunction/  trivial AR and TR  . Uterine polypectomy      Current Outpatient Medications  Medication Sig Dispense Refill  . albuterol (PROAIR HFA) 108 (90 Base) MCG/ACT inhaler Inhale 2 puffs into the lungs every 6 (six) hours as needed for wheezing or shortness of breath. 1 Inhaler 3  . ALPRAZolam (XANAX) 0.25 MG tablet Take 1 tablet (0.25 mg total) by mouth at bedtime as needed for anxiety. 5 tablet 0  . amoxicillin (AMOXIL) 500 MG capsule TAKE 4 CAPSULES PO 1 HOUR PRIOR TO DENTAL APPOINTMENT  1  . azelastine (ASTELIN) 0.1 % nasal spray Place 2 sprays into both nostrils 2 (two) times daily as needed for allergies.     . Cholecalciferol (VITAMIN D) 2000 units CAPS Take 2,000 Units by mouth daily.     Marland Kitchen dicyclomine (BENTYL) 10 MG capsule Take 1 tab by mouth every morning. May take twice daily as needed. (Patient taking differently: Take 10 mg by mouth 2 (two) times daily. ) 60 capsule 6  . diphenhydrAMINE (BENADRYL) 25 mg capsule Take 25 mg by mouth daily as needed for itching.     . diphenoxylate-atropine (LOMOTIL) 2.5-0.025 MG tablet Take 1 tablet by mouth 4 (four) times daily. (Patient taking differently: Take 1  tablet by mouth 4 (four) times daily as needed for diarrhea or loose stools. ) 360 tablet 1  . DULoxetine (CYMBALTA) 60 MG capsule Take 30 mg by mouth daily.     . fluticasone (FLONASE) 50 MCG/ACT nasal spray Place 2 sprays into both nostrils 2 (two) times daily as needed (FOR NASAL CONGESTION.).     Marland Kitchen furosemide (LASIX) 20 MG tablet Take 2 tablets (40 mg total) by mouth daily. 180 tablet 3  . halobetasol (ULTRAVATE) 0.05 % cream Apply 1 application topically 2 (two) times daily as needed (psoriasis).     Marland Kitchen letrozole (FEMARA) 2.5 MG tablet TAKE 1 TABLET(2.5 MG) BY MOUTH DAILY 30 tablet 0  . levothyroxine (SYNTHROID, LEVOTHROID) 137 MCG tablet Take 137 mcg by mouth  daily before breakfast. For thyroid therapy    . loperamide (IMODIUM A-D) 2 MG tablet Take 2-4 mg by mouth as needed for diarrhea or loose stools.    . mirtazapine (REMERON) 15 MG tablet TAKE 1 TABLET(15 MG) BY MOUTH AT BEDTIME (Patient taking differently: Take 15 mg by mouth at bedtime. ) 30 tablet 11  . ondansetron (ZOFRAN) 8 MG tablet ondansetron HCl 8 mg tablet    . oxyCODONE 10 MG TABS Take 1 tablet (10 mg total) by mouth every 6 (six) hours as needed for severe pain. 60 tablet 0  . polyvinyl alcohol (LIQUIFILM TEARS) 1.4 % ophthalmic solution Place 1 drop into both eyes 2 (two) times daily as needed for dry eyes.     . potassium chloride SA (K-DUR,KLOR-CON) 20 MEQ tablet Take 10 mEq by mouth daily.    . rivaroxaban (XARELTO) 20 MG TABS tablet Take 20 mg by mouth daily with supper.    Marland Kitchen STIOLTO RESPIMAT 2.5-2.5 MCG/ACT AERS INHALE 2 PUFFS INTO THE LUNGS DAILY 4 g 5  . Ustekinumab (STELARA [place]) Inject 1 Dose into the skin every 3 (three) months.    . diltiazem (CARDIZEM CD) 120 MG 24 hr capsule Take 1 capsule (120 mg total) by mouth every evening. (Patient taking differently: Take 120 mg by mouth every evening. ) 90 capsule 3  . diltiazem (CARDIZEM CD) 180 MG 24 hr capsule Take 1 capsule (180 mg total) by mouth every morning. (Patient  taking differently: Take 180 mg by mouth every morning. ) 90 capsule 3   No current facility-administered medications for this visit.    Allergies:  Codeine   Social History: The patient  reports that she quit smoking about 37 years ago. Her smoking use included cigarettes. She has a 20.00 pack-year smoking history. She has never used smokeless tobacco. She reports that she drinks alcohol. She reports that she does not use drugs.   ROS:  Please see the history of present illness. Otherwise, complete review of systems is positive for chronic pain, fatigue.  All other systems are reviewed and negative.   Physical Exam: VS:  BP 128/74 (BP Location: Right Arm)   Pulse 67   Ht 5' 4"  (1.626 m)   Wt 171 lb (77.6 kg)   SpO2 93%   BMI 29.35 kg/m , BMI Body mass index is 29.35 kg/m.  Wt Readings from Last 3 Encounters:  09/03/18 171 lb (77.6 kg)  08/27/18 172 lb 6.4 oz (78.2 kg)  08/14/18 162 lb (73.5 kg)    General: Elderly woman, appears comfortable at rest. HEENT: Conjunctiva and lids normal, oropharynx clear. Neck: Supple, no elevated JVP or carotid bruits, no thyromegaly. Lungs: Dereased breath sounds right base, nonlabored breathing at rest. Cardiac: Regular rate and rhythm, no S3, soft systolic murmur. Abdomen: Soft, nontender, bowel sounds present. Extremities: Mild ankle edema, distal pulses 2+. Skin: Warm and dry. Musculoskeletal: No kyphosis. Neuropsychiatric: Alert and oriented x3, affect grossly appropriate.  ECG: I personally reviewed the tracing from 03/22/2018 which showed a narrow complex regular tachycardia read as rapid atrial fibrillation although possibly an atrial tachycardia.  Recent Labwork: 11/10/2017: TSH 0.388 11/12/2017: B Natriuretic Peptide 76.7 03/22/2018: Magnesium 1.7 07/22/2018: ALT 10; AST 17; BUN 14; Creatinine, Ser 0.95; Hemoglobin 11.4; Platelets 241; Potassium 4.2; Sodium 136     Component Value Date/Time   CHOL 147 11/14/2017 1413    Other  Studies Reviewed Today:  Echocardiogram 08/20/2017: Study Conclusions  - Left ventricle: The cavity size was normal.  Wall thickness was normal. Systolic function was normal. The estimated ejection fraction was in the range of 60% to 65%. Wall motion was normal; there were no regional wall motion abnormalities. Doppler parameters are consistent with abnormal left ventricular relaxation (grade 1 diastolic dysfunction). - Aortic valve: There was no stenosis. There was trivial regurgitation. - Mitral valve: Mildly calcified annulus. There was no significant regurgitation. - Right ventricle: The cavity size was normal. Systolic function was normal. - Tricuspid valve: Peak RV-RA gradient (S): 27 mm Hg. - Pulmonary arteries: PA peak pressure: 30 mm Hg (S). - Inferior vena cava: The vessel was normal in size. The respirophasic diameter changes were in the normal range (= 50%), consistent with normal central venous pressure.  Impressions:  - Normal LV size with EF 60-65%. Normal RV size and systolic function. No significant valvular abnormalities.  Assessment and Plan:  1.  Paroxysmal atrial fibrillation.  CHADSVASC score is 3.  She continues on Xarelto, no obvious GI bleeding in the interim with stable hemoglobin.  Heart rate control regimen continues with Cardizem CD.  No changes were made today.  2.  Recurrent pleural effusion with Pleurx catheter in place, also ascites.  She has history of ovarian cancer with follow-up ultrasound and CT imaging pending in the near future.  With chronic diastolic heart failure we will increase her Lasix to 40 mg daily as well to see if this is helpful.  Continue potassium supplements.  Follow-up BMET for next visit.   Current medicines were reviewed with the patient today.   Orders Placed This Encounter  Procedures  . Basic Metabolic Panel (BMET)    Disposition: Follow-up in 3 months.  Signed, Satira Sark, MD,  [facility] 09/03/2018 2:09 PM    [facility] at [facility] [address], [place], [place] [address] Phone: [phone]; Fax: [phone]

## 2018-09-02 NOTE — Telephone Encounter (Signed)
Called the daughter and gave the information for the [place] scan on Friday 11/29. Arrive at 9:45 am for a 10 am appt, NPO after midnight

## 2018-09-03 ENCOUNTER — Ambulatory Visit (INDEPENDENT_AMBULATORY_CARE_PROVIDER_SITE_OTHER): Payer: Medicare Other | Admitting: Cardiology

## 2018-09-03 ENCOUNTER — Encounter: Payer: Self-pay | Admitting: Cardiology

## 2018-09-03 ENCOUNTER — Telehealth: Payer: Self-pay

## 2018-09-03 VITALS — BP 128/74 | HR 67 | Ht 64.0 in | Wt 171.0 lb

## 2018-09-03 DIAGNOSIS — I5032 Chronic diastolic (congestive) heart failure: Secondary | ICD-10-CM

## 2018-09-03 DIAGNOSIS — I480 Paroxysmal atrial fibrillation: Secondary | ICD-10-CM | POA: Diagnosis not present

## 2018-09-03 DIAGNOSIS — J90 Pleural effusion, not elsewhere classified: Secondary | ICD-10-CM

## 2018-09-03 MED ORDER — FUROSEMIDE 20 MG PO TABS: 40.0000 mg | ORAL_TABLET | Freq: Every day | ORAL | 3 refills | Status: DC

## 2018-09-03 NOTE — Telephone Encounter (Signed)
Currently draining PleurX every 5 days. Had to drain yesterday which was the 4th day due to shortness of breath. The amount was 170 cc's. She had been draining around 80 cc's every 5 days, the amount has increased. She is scheduled for Abd Ultrasound on 09/05/18 due to abdomen being slightly distended. ordered by oncology. Recommended draining pleurX every other day. Scheduled to see Dr Servando Snare on 09/11/18.

## 2018-09-03 NOTE — Patient Instructions (Signed)
Medication Instructions:  INCREASE LASIX TO 40 MG DAILY   Labwork: JUST BEFORE NEXT VISIT IN 3 MONTHS   Testing/Procedures: NONE  Follow-Up: Your physician recommends that you schedule a follow-up appointment in: 3 MONTHS    Any Other Special Instructions Will Be Listed Below (If Applicable).     If you need a refill on your cardiac medications before your next appointment, please call your pharmacy.

## 2018-09-04 ENCOUNTER — Encounter (HOSPITAL_COMMUNITY): Payer: Medicare Other

## 2018-09-05 ENCOUNTER — Ambulatory Visit (HOSPITAL_COMMUNITY)
Admission: RE | Admit: 2018-09-05 | Discharge: 2018-09-05 | Disposition: A | Discharge status: Home / Self Care | Payer: Medicare Other | Source: Ambulatory Visit | Attending: Gynecologic Oncology | Admitting: Gynecologic Oncology

## 2018-09-05 DIAGNOSIS — R140 Abdominal distension (gaseous): Secondary | ICD-10-CM | POA: Insufficient documentation

## 2018-09-08 ENCOUNTER — Ambulatory Visit (HOSPITAL_COMMUNITY): Payer: Medicare Other

## 2018-09-08 ENCOUNTER — Telehealth: Payer: Self-pay

## 2018-09-08 NOTE — Telephone Encounter (Signed)
-----   Message from Thomos Lemons, NT sent at 09/08/2018  3:21 PM EST -----   ----- Message ----- From: Dorothyann Gibbs, NP Sent: 09/05/2018   2:51 PM EST To: Lovena Le Wheat, NT  Can you have Raquel Sarna or whoever is there with you let her know that her [place] does not show a large volume of ascites so no need for paracentesis at this time. Thanks

## 2018-09-08 NOTE — Telephone Encounter (Signed)
Told the daughter [name] the result of the [place] as noted below by Joylene John, NP. Daughter verbalized understanding.Marland Kitchen

## 2018-09-09 ENCOUNTER — Encounter (HOSPITAL_COMMUNITY)
Admission: RE | Admit: 2018-09-09 | Discharge: 2018-09-09 | Disposition: A | Discharge status: Home / Self Care | Payer: Medicare Other | Source: Ambulatory Visit | Attending: Emergency Medicine | Admitting: Emergency Medicine

## 2018-09-09 DIAGNOSIS — J431 Panlobular emphysema: Secondary | ICD-10-CM | POA: Diagnosis present

## 2018-09-09 NOTE — Progress Notes (Signed)
Daily Session Note  Patient Details  Name: [name] MRN: [id number] Date of Birth: Nov 02, 1932 Referring Provider:     [place] from 08/27/2018 in [place]  Referring Provider  [place]       Encounter Date: 09/09/2018  Check In: Session Check In - 09/09/18 1045      Check-In   Supervising physician immediately available to respond to emergencies  See telemetry face sheet for immediately available MD    Location  [facility]    Staff Present  Russella Dar, MS, EP, [facility], Exercise Physiologist;Marquist Binstock Zachery Conch, Exercise Physiologist    Medication changes reported      No    Fall or balance concerns reported     Yes    Comments  Uses a walker    Warm-up and Cool-down  Performed as group-led instruction    Resistance Training Performed  Yes    VAD Patient?  No    PAD/SET Patient?  No      Pain Assessment   Currently in Pain?  No/denies    Pain Score  0-No pain    Multiple Pain Sites  No       Capillary Blood Glucose: No results found for this or any previous visit (from the past 24 hour(s)).    Social History   Tobacco Use  Smoking Status Former Smoker  . Packs/day: 1.00  . Years: 20.00  . Pack years: 20.00  . Types: Cigarettes  . Last attempt to quit: 10/08/1980  . Years since quitting: 37.9  Smokeless Tobacco Never Used    Goals Met:  Independence with exercise equipment Improved SOB with ADL's Exercise tolerated well No report of cardiac concerns or symptoms Strength training completed today  Goals Unmet:  Not Applicable   Comments: Pt able to follow exercise prescription today without complaint.  Will continue to monitor for progression. Check out 11:45.   Dr. Sinda Du is Medical Director for [facility].

## 2018-09-10 ENCOUNTER — Other Ambulatory Visit: Payer: Self-pay | Admitting: Cardiothoracic Surgery

## 2018-09-10 DIAGNOSIS — J90 Pleural effusion, not elsewhere classified: Secondary | ICD-10-CM

## 2018-09-10 NOTE — Progress Notes (Signed)
Pulmonary Individual Treatment Plan  Patient Details  Name: [name] MRN: [id number] Date of Birth: April 14, 1933 Referring Provider:     [place] from 08/27/2018 in [place]  Referring Provider  [place]       Initial Encounter Date:    CARDIAC REHAB PHASE II ORIENTATION from 08/27/2018 in [place]  Date  08/27/18      Visit Diagnosis: Panlobular emphysema ([place])  Patient's Home Medications on Admission:   Current Outpatient Medications:  .  albuterol (PROAIR HFA) 108 (90 Base) MCG/ACT inhaler, Inhale 2 puffs into the lungs every 6 (six) hours as needed for wheezing or shortness of breath., Disp: 1 Inhaler, Rfl: 3 .  ALPRAZolam (XANAX) 0.25 MG tablet, Take 1 tablet (0.25 mg total) by mouth at bedtime as needed for anxiety., Disp: 5 tablet, Rfl: 0 .  amoxicillin (AMOXIL) 500 MG capsule, TAKE 4 CAPSULES PO 1 HOUR PRIOR TO DENTAL APPOINTMENT, Disp: , Rfl: 1 .  azelastine (ASTELIN) 0.1 % nasal spray, Place 2 sprays into both nostrils 2 (two) times daily as needed for allergies. , Disp: , Rfl:  .  Cholecalciferol (VITAMIN D) 2000 units CAPS, Take 2,000 Units by mouth daily. , Disp: , Rfl:  .  dicyclomine (BENTYL) 10 MG capsule, Take 1 tab by mouth every morning. May take twice daily as needed. (Patient taking differently: Take 10 mg by mouth 2 (two) times daily. ), Disp: 60 capsule, Rfl: 6 .  diltiazem (CARDIZEM CD) 120 MG 24 hr capsule, Take 1 capsule (120 mg total) by mouth every evening. (Patient taking differently: Take 120 mg by mouth every evening. ), Disp: 90 capsule, Rfl: 3 .  diltiazem (CARDIZEM CD) 180 MG 24 hr capsule, Take 1 capsule (180 mg total) by mouth every morning. (Patient taking differently: Take 180 mg by mouth every morning. ), Disp: 90 capsule, Rfl: 3 .  diphenhydrAMINE (BENADRYL) 25 mg capsule, Take 25 mg by mouth daily as needed for itching. , Disp: , Rfl:  .  diphenoxylate-atropine  (LOMOTIL) 2.5-0.025 MG tablet, Take 1 tablet by mouth 4 (four) times daily. (Patient taking differently: Take 1 tablet by mouth 4 (four) times daily as needed for diarrhea or loose stools. ), Disp: 360 tablet, Rfl: 1 .  DULoxetine (CYMBALTA) 60 MG capsule, Take 30 mg by mouth daily. , Disp: , Rfl:  .  fluticasone (FLONASE) 50 MCG/ACT nasal spray, Place 2 sprays into both nostrils 2 (two) times daily as needed (FOR NASAL CONGESTION.). , Disp: , Rfl:  .  furosemide (LASIX) 20 MG tablet, Take 2 tablets (40 mg total) by mouth daily., Disp: 180 tablet, Rfl: 3 .  halobetasol (ULTRAVATE) 0.05 % cream, Apply 1 application topically 2 (two) times daily as needed (psoriasis). , Disp: , Rfl:  .  letrozole (FEMARA) 2.5 MG tablet, TAKE 1 TABLET(2.5 MG) BY MOUTH DAILY, Disp: 30 tablet, Rfl: 0 .  levothyroxine (SYNTHROID, LEVOTHROID) 137 MCG tablet, Take 137 mcg by mouth daily before breakfast. For thyroid therapy, Disp: , Rfl:  .  loperamide (IMODIUM A-D) 2 MG tablet, Take 2-4 mg by mouth as needed for diarrhea or loose stools., Disp: , Rfl:  .  mirtazapine (REMERON) 15 MG tablet, TAKE 1 TABLET(15 MG) BY MOUTH AT BEDTIME (Patient taking differently: Take 15 mg by mouth at bedtime. ), Disp: 30 tablet, Rfl: 11 .  ondansetron (ZOFRAN) 8 MG tablet, ondansetron HCl 8 mg tablet, Disp: , Rfl:  .  oxyCODONE 10 MG TABS,  Take 1 tablet (10 mg total) by mouth every 6 (six) hours as needed for severe pain., Disp: 60 tablet, Rfl: 0 .  polyvinyl alcohol (LIQUIFILM TEARS) 1.4 % ophthalmic solution, Place 1 drop into both eyes 2 (two) times daily as needed for dry eyes. , Disp: , Rfl:  .  potassium chloride SA (K-DUR,KLOR-CON) 20 MEQ tablet, Take 10 mEq by mouth daily., Disp: , Rfl:  .  rivaroxaban (XARELTO) 20 MG TABS tablet, Take 20 mg by mouth daily with supper., Disp: , Rfl:  .  STIOLTO RESPIMAT 2.5-2.5 MCG/ACT AERS, INHALE 2 PUFFS INTO THE LUNGS DAILY, Disp: 4 g, Rfl: 5 .  Ustekinumab (STELARA [place]), Inject 1 Dose into the skin  every 3 (three) months., Disp: , Rfl:   Past Medical History: Past Medical History:  Diagnosis Date  . Anxiety   . Chronic blood loss anemia    03-04-2018 diverticular bleed and rectal bleeding,  transfused 2 units PRBCs 03-08-2018  . Colitis   . COPD (chronic obstructive pulmonary disease) (HCC)    Dr. Lamonte Sakai  . Depression   . Diastolic CHF, chronic ([place])   . Diverticulosis   . Family history of colon cancer   . Fibromyalgia   . Genetic testing 04/07/2018   MyRisk (35 genes) @ [facility] - No pathogenic mutations detected  . GERD (gastroesophageal reflux disease)   . Hemorrhoids   . Hiatal hernia   . Hip pain 07/2018   Right Hip Pain  . History of rectal polyps   . History of shingles   . Hypothyroidism   . IBS (irritable bowel syndrome)   . Lymphocytic colitis    Dr. Henrene Pastor  . Malignant ascites    Admission 06/2017 abdominal s/p parencentesis 07-01-2017 2.5L, 07-08-2016  2.7L, 07-12-2017  1463m  . Neuropathy due to chemotherapeutic drug ([facility]   . Osteoporosis   . Ovarian cancer ([facility]    Chemotherapy - Dr. GAlvy Bimler . [place] atrial [place]    Xarelto stopped 03-07-2018 due to lower GI bleed  . Pleural effusion    s/p  right thoracentesis, 02-2018 1.3L and 03-17-2018 right thoracentesis 641m, post cxr no residual effusion  . Psoriatic arthritis ([facility]  . Schatzki's ring 2013  . Seasonal allergic rhinitis     Tobacco Use: Social History   Tobacco Use  Smoking Status Former Smoker  . Packs/day: 1.00  . Years: 20.00  . Pack years: 20.00  . Types: Cigarettes  . Last attempt to quit: 10/08/1980  . Years since quitting: 37.9  Smokeless Tobacco Never Used    Labs: Recent ReChemical engineer  Labs for ITP Cardiac and Pulmonary Rehab Latest Ref Rng & Units 06/12/2013 11/14/2017   Cholestrol 0 - 200 mg/dL - 147   Hemoglobin A1c <5.7 % 5.7(H) -      Capillary Blood Glucose: Lab Results  Component Value Date   GLUCAP 102 (H) 03/07/2018   GLUCAP 100  (H) 03/07/2018   GLUCAP 111 (H) 03/06/2018   GLUCAP 114 (H) 03/05/2018   GLUCAP 100 (H) 03/05/2018     Pulmonary Assessment Scores: Pulmonary Assessment Scores    Row Name 08/27/18 1058         ADL UCSD   ADL Phase  Entry     SOB Score total  73     Rest  1     Walk  12     Stairs  5     Bath  3  Dress  4     Shop  5       CAT Score   CAT Score  22       mMRC Score   mMRC Score  3        Pulmonary Function Assessment: Pulmonary Function Assessment - 08/27/18 1047      Pulmonary Function Tests   FVC%  1.77 %    FEV1%  1.84 %    FEV1/FVC Ratio  73      Initial Spirometry Results   FVC%  1.77 %    FEV1%  1.84 %    FEV1/FVC Ratio  73      Post Bronchodilator Spirometry Results   FVC%  72 %    FEV1%  52 %    FEV1/FVC Ratio  54      Breath   Bilateral Breath Sounds  Clear    Shortness of Breath  Yes;Limiting activity       Exercise Target Goals: Exercise Program Goal: Individual exercise prescription set using results from initial 6 min walk test and THRR while considering  patient's activity barriers and safety.   Exercise Prescription Goal: Initial exercise prescription builds to 30-45 minutes a day of aerobic activity, 2-3 days per week.  Home exercise guidelines will be given to patient during program as part of exercise prescription that the participant will acknowledge.  Activity Barriers & Risk Stratification: Activity Barriers & Cardiac Risk Stratification - 08/27/18 1040      Activity Barriers & Cardiac Risk Stratification   Activity Barriers  Right Knee Replacement;Shortness of Breath;Muscular Weakness;Other (comment)    Comments  L hip pain     Cardiac Risk Stratification  Moderate       6 Minute Walk: 6 Minute Walk    Row Name 08/27/18 1038         6 Minute Walk   Phase  Initial     Distance  600 feet     Walk Time  5 minutes     # of Rest Breaks  1 got tired      MPH  1.14     METS  1.87     RPE  13     Perceived Dyspnea    13     VO2 Peak  4.81     Symptoms  Yes (comment)     Comments  5/10 bilateral buttocks pain      Resting HR  92 bpm     Resting BP  120/50     Resting Oxygen Saturation   95 %     Exercise Oxygen Saturation  during 6 min walk  94 %     Max Ex. HR  135 bpm     Max Ex. BP  154/52     2 Minute Post BP  130/54        Oxygen Initial Assessment: Oxygen Initial Assessment - 08/27/18 1046      Home Oxygen   Home Oxygen Device  None    Sleep Oxygen Prescription  None    Home Exercise Oxygen Prescription  None    Home at Rest Exercise Oxygen Prescription  None      Initial 6 min Walk   Oxygen Used  None      Program Oxygen Prescription   Program Oxygen Prescription  None       Oxygen Re-Evaluation: Oxygen Re-Evaluation    Row Name 09/10/18 1417  Program Oxygen Prescription   Program Oxygen Prescription  None         Home Oxygen   Home Oxygen Device  None       Sleep Oxygen Prescription  None       Home Exercise Oxygen Prescription  None       Home at Rest Exercise Oxygen Prescription  None       Compliance with Home Oxygen Use  - N/A         Goals/Expected Outcomes   Short Term Goals  To learn and understand importance of monitoring SPO2 with pulse oximeter and demonstrate accurate use of the pulse oximeter.;To learn and understand importance of maintaining oxygen saturations>88%;To learn and demonstrate proper pursed lip breathing techniques or other breathing techniques.       Long  Term Goals  Verbalizes importance of monitoring SPO2 with pulse oximeter and return demonstration;Maintenance of O2 saturations>88%;Exhibits proper breathing techniques, such as pursed lip breathing or other method taught during program session       Comments  Patient is able to demonstrate proper usage of pulse oximeter with return demonstration and proper pursed lip breathing. She is able to verbalize the importance of maintaining her O2 saturation >88%. Will continue to  monitor.        Goals/Expected Outcomes  Patient will continue to meet her short and long goals.           Oxygen Discharge (Final Oxygen Re-Evaluation): Oxygen Re-Evaluation - 09/10/18 1417      Program Oxygen Prescription   Program Oxygen Prescription  None      Home Oxygen   Home Oxygen Device  None    Sleep Oxygen Prescription  None    Home Exercise Oxygen Prescription  None    Home at Rest Exercise Oxygen Prescription  None    Compliance with Home Oxygen Use  --   N/A     Goals/Expected Outcomes   Short Term Goals  To learn and understand importance of monitoring SPO2 with pulse oximeter and demonstrate accurate use of the pulse oximeter.;To learn and understand importance of maintaining oxygen saturations>88%;To learn and demonstrate proper pursed lip breathing techniques or other breathing techniques.    Long  Term Goals  Verbalizes importance of monitoring SPO2 with pulse oximeter and return demonstration;Maintenance of O2 saturations>88%;Exhibits proper breathing techniques, such as pursed lip breathing or other method taught during program session    Comments  Patient is able to demonstrate proper usage of pulse oximeter with return demonstration and proper pursed lip breathing. She is able to verbalize the importance of maintaining her O2 saturation >88%. Will continue to monitor.     Goals/Expected Outcomes  Patient will continue to meet her short and long goals.        Initial Exercise Prescription: Initial Exercise Prescription - 08/27/18 1000      Date of Initial Exercise RX and Referring Provider   Date  08/27/18    Referring Provider  Byrum     Expected Discharge Date  11/27/18      Treadmill   MPH  0.8    Grade  0    Minutes  17    METs  1.61      NuStep   Level  1    SPM  48    Minutes  17    METs  1.9      Prescription Details   Frequency (times per week)  2    Duration  Progress to 30 minutes of continuous aerobic without signs/symptoms of  physical distress      Intensity   THRR 40-80% of Max Heartrate  110-119-128    Ratings of Perceived Exertion  11-13    Perceived Dyspnea  0-4      Progression   Progression  Continue to progress workloads to maintain intensity without signs/symptoms of physical distress.      Resistance Training   Training Prescription  Yes    Weight  1    Reps  10-15       Perform Capillary Blood Glucose checks as needed.  Exercise Prescription Changes:   Exercise Comments:  Exercise Comments    Row Name 09/02/18 1450           Exercise Comments  Patient has attended one full exercise session so far. She tolerated exercise well. We will monitor for progress.           Exercise Goals and Review:  Exercise Goals    Row Name 08/27/18 1042             Exercise Goals   Increase Physical Activity  Yes       Intervention  Provide advice, education, support and counseling about physical activity/exercise needs.;Develop an individualized exercise prescription for aerobic and resistive training based on initial evaluation findings, risk stratification, comorbidities and participant's personal goals.       Expected Outcomes  Short Term: Attend rehab on a regular basis to increase amount of physical activity.       Increase Strength and Stamina  Yes       Intervention  Provide advice, education, support and counseling about physical activity/exercise needs.;Develop an individualized exercise prescription for aerobic and resistive training based on initial evaluation findings, risk stratification, comorbidities and participant's personal goals.       Expected Outcomes  Short Term: Increase workloads from initial exercise prescription for resistance, speed, and METs.       Able to understand and use rate of perceived exertion (RPE) scale  Yes       Intervention  Provide education and explanation on how to use RPE scale       Expected Outcomes  Short Term: Able to use RPE daily in rehab to  express subjective intensity level;Long Term:  Able to use RPE to guide intensity level when exercising independently       Able to understand and use Dyspnea scale  Yes       Intervention  Provide education and explanation on how to use Dyspnea scale       Expected Outcomes  Short Term: Able to use Dyspnea scale daily in rehab to express subjective sense of shortness of breath during exertion;Long Term: Able to use Dyspnea scale to guide intensity level when exercising independently       Knowledge and understanding of Target Heart Rate Range (THRR)  Yes       Intervention  Provide education and explanation of THRR including how the numbers were predicted and where they are located for reference       Expected Outcomes  Short Term: Able to state/look up THRR;Long Term: Able to use THRR to govern intensity when exercising independently;Short Term: Able to use daily as guideline for intensity in rehab       Able to check pulse independently  Yes       Intervention  Provide education and demonstration on how to check pulse in carotid and radial arteries.;Review the  importance of being able to check your own pulse for safety during independent exercise       Expected Outcomes  Short Term: Able to explain why pulse checking is important during independent exercise;Long Term: Able to check pulse independently and accurately       Understanding of Exercise Prescription  Yes       Intervention  Provide education, explanation, and written materials on patient's individual exercise prescription       Expected Outcomes  Short Term: Able to explain program exercise prescription;Long Term: Able to explain home exercise prescription to exercise independently          Exercise Goals Re-Evaluation : Exercise Goals Re-Evaluation    Row Name 09/02/18 1449             Exercise Goal Re-Evaluation   Exercise Goals Review  Increase Physical Activity;Increase Strength and Stamina;Able to understand and use rate  of perceived exertion (RPE) scale;Understanding of Exercise Prescription;Knowledge and understanding of Target Heart Rate Range (THRR);Able to understand and use Dyspnea scale;Able to check pulse independently       Comments  Patient just started pulmonary rehab. She has attended 1 exercise session so far. We will monitor progress.       Expected Outcomes  Increase strength to do more on her own.           Discharge Exercise Prescription (Final Exercise Prescription Changes):   Nutrition:  Target Goals: Understanding of nutrition guidelines, daily intake of sodium <1580m, cholesterol <2085m calories 30% from fat and 7% or less from saturated fats, daily to have 5 or more servings of fruits and vegetables.  Biometrics: Pre Biometrics - 08/27/18 1043      Pre Biometrics   Height  _0  (1.626 m)    Weight  78.2 kg    Waist Circumference  41.5 inches    Hip Circumference  44 inches    Waist to Hip Ratio  0.94 %    BMI (Calculated)  29.58    Triceps Skinfold  16 mm    % Body Fat  40.5 %    Grip Strength  17.2 kg    Flexibility  0 in    Single Leg Stand  13 seconds        Nutrition Therapy Plan and Nutrition Goals: Nutrition Therapy & Goals - 09/10/18 1419      Nutrition Therapy   RD appointment deferred  Yes      Personal Nutrition Goals   Comments  Patient continues to eat a low sodium and heart healthy diet. She is encourage to meet with RD.      Intervention Plan   Intervention  Nutrition handout(s) given to patient.       Nutrition Assessments: Nutrition Assessments - 08/27/18 1102      MEDFICTS Scores   Pre Score  33       Nutrition Goals Re-Evaluation:   Nutrition Goals Discharge (Final Nutrition Goals Re-Evaluation):   Psychosocial: Target Goals: Acknowledge presence or absence of significant depression and/or stress, maximize coping skills, provide positive support system. Participant is able to verbalize types and ability to use techniques and skills  needed for reducing stress and depression.  Initial Review & Psychosocial Screening: Initial Psych Review & Screening - 08/27/18 1100      Initial Review   Current issues with  Current Depression   Just a little depressed about her health.     Family Dynamics   Good Support System?  Yes      Barriers   Psychosocial barriers to participate in program  The patient should benefit from training in stress management and relaxation.;Psychosocial barriers identified (see note)   Feeling depressed due to her health     Screening Interventions   Interventions  Encouraged to exercise    Expected Outcomes  Short Term goal: Identification and review with participant of any Quality of Life or Depression concerns found by scoring the questionnaire.;Long Term goal: The participant improves quality of Life and PHQ9 Scores as seen by post scores and/or verbalization of changes       Quality of Life Scores: Quality of Life - 08/27/18 1044      Quality of Life   Select  Quality of Life      Quality of Life Scores   Health/Function Pre  14.8 %    Socioeconomic Pre  22.8 %    Psych/Spiritual Pre  23.14 %    Family Pre  30 %    GLOBAL Pre  20.25 %      Scores of 19 and below usually indicate a poorer quality of life in these areas.  A difference of  2-3 points is a clinically meaningful difference.  A difference of 2-3 points in the total score of the Quality of Life Index has been associated with significant improvement in overall quality of life, self-image, physical symptoms, and general health in studies assessing change in quality of life.   PHQ-9: Recent Review Flowsheet Data    Depression screen [facility] 2/9 08/27/2018 12/18/2017   Decreased Interest 0 1   Down, Depressed, Hopeless 0 3   PHQ - 2 Score 0 4   Altered sleeping 1 3   Tired, decreased energy 2 3   Change in appetite 1 0   Feeling bad or failure about yourself  2 0   Trouble concentrating 1 1   Moving slowly or  fidgety/restless 0 0   Suicidal thoughts - 0   PHQ-9 Score 7 11   Difficult doing work/chores Somewhat difficult Somewhat difficult     Interpretation of Total Score  Total Score Depression Severity:  1-4 = Minimal depression, 5-9 = Mild depression, 10-14 = Moderate depression, 15-19 = Moderately severe depression, 20-27 = Severe depression   Psychosocial Evaluation and Intervention: Psychosocial Evaluation - 08/27/18 1101      Psychosocial Evaluation & Interventions   Interventions  Encouraged to exercise with the program and follow exercise prescription;Stress management education    Continue Psychosocial Services   Follow up required by staff       Psychosocial Re-Evaluation: Psychosocial Re-Evaluation    [place] Name 09/10/18 1421             Psychosocial Re-Evaluation   Current issues with  Current Depression       Comments  Patient initial QOL score was 20.25 and her PHQ-9 score was 7. She is currently being treated with Cymbalta and Alprazolam. Will continue to monitor for progress.        Expected Outcomes  Patient's exit QOL and PHQ-9 scores will improve and she will have no addtional psychosocial issues identified at discharge.        Interventions  Stress management education;Encouraged to attend Cardiac Rehabilitation for the exercise;Relaxation education       Continue Psychosocial Services   Follow up required by staff          Psychosocial Discharge (Final Psychosocial Re-Evaluation): Psychosocial Re-Evaluation - 09/10/18 1421  Psychosocial Re-Evaluation   Current issues with  Current Depression    Comments  Patient initial QOL score was 20.25 and her PHQ-9 score was 7. She is currently being treated with Cymbalta and Alprazolam. Will continue to monitor for progress.     Expected Outcomes  Patient's exit QOL and PHQ-9 scores will improve and she will have no addtional psychosocial issues identified at discharge.     Interventions  Stress management  education;Encouraged to attend Cardiac Rehabilitation for the exercise;Relaxation education    Continue Psychosocial Services   Follow up required by staff        Education: Education Goals: Education classes will be provided on a weekly basis, covering required topics. Participant will state understanding/return demonstration of topics presented.  Learning Barriers/Preferences: Learning Barriers/Preferences - 08/27/18 1005      Learning Barriers/Preferences   Learning Barriers  None    Learning Preferences  Individual Instruction;Group Instruction;Verbal Instruction;Skilled Demonstration       Education Topics: How Lungs Work and Diseases: - Discuss the anatomy of the lungs and diseases that can affect the lungs, such as COPD.   Exercise: -Discuss the importance of exercise, FITT principles of exercise, normal and abnormal responses to exercise, and how to exercise safely.   Environmental Irritants: -Discuss types of environmental irritants and how to limit exposure to environmental irritants.   Meds/Inhalers and oxygen: - Discuss respiratory medications, definition of an inhaler and oxygen, and the proper way to use an inhaler and oxygen.   PULMONARY REHAB OTHER RESPIRATORY from 08/28/2018 in [place]  Date  08/28/18  Educator  Wynetta Emery      Energy Saving Techniques: - Discuss methods to conserve energy and decrease shortness of breath when performing activities of daily living.    Bronchial Hygiene / Breathing Techniques: - Discuss breathing mechanics, pursed-lip breathing technique,  proper posture, effective ways to clear airways, and other functional breathing techniques   Cleaning Equipment: - Provides group verbal and written instruction about the health risks of elevated stress, cause of high stress, and healthy ways to reduce stress.   Nutrition I: Fats: - Discuss the types of cholesterol, what cholesterol does to the body, and how  cholesterol levels can be controlled.   Nutrition II: Labels: -Discuss the different components of food labels and how to read food labels.   Respiratory Infections: - Discuss the signs and symptoms of respiratory infections, ways to prevent respiratory infections, and the importance of seeking medical treatment when having a respiratory infection.   Stress I: Signs and Symptoms: - Discuss the causes of stress, how stress may lead to anxiety and depression, and ways to limit stress.   Stress II: Relaxation: -Discuss relaxation techniques to limit stress.   Oxygen for Home/Travel: - Discuss how to prepare for travel when on oxygen and proper ways to transport and store oxygen to ensure safety.   Knowledge Questionnaire Score: Knowledge Questionnaire Score - 08/27/18 1006      Knowledge Questionnaire Score   Pre Score  16/18       Core Components/Risk Factors/Patient Goals at Admission: Personal Goals and Risk Factors at Admission - 08/27/18 1102      Core Components/Risk Factors/Patient Goals on Admission    Weight Management  Weight Maintenance    Personal Goal Other  Yes    Personal Goal  Return to shopping, become more independent and be able to go places by herself.     Intervention  Attend program 2 x week and  supplement with at home exercise 3 x week.     Expected Outcomes  Reach personal goals.        Core Components/Risk Factors/Patient Goals Review:  Goals and Risk Factor Review    Row Name 09/10/18 1419             Core Components/Risk Factors/Patient Goals Review   Personal Goals Review  Weight Management/Obesity;Improve shortness of breath with ADL's Be more independent; do her own shopping; do things by herself.        Review  Patient is new to the program. She has completed 3 sessions. Will continue to monitor for progress.        Expected Outcomes  Patient will continue to attend sessions and complete the program meeting her personal goals.            Core Components/Risk Factors/Patient Goals at Discharge (Final Review):  Goals and Risk Factor Review - 09/10/18 1419      Core Components/Risk Factors/Patient Goals Review   Personal Goals Review  Weight Management/Obesity;Improve shortness of breath with ADL's   Be more independent; do her own shopping; do things by herself.    Review  Patient is new to the program. She has completed 3 sessions. Will continue to monitor for progress.     Expected Outcomes  Patient will continue to attend sessions and complete the program meeting her personal goals.        ITP Comments: ITP Comments    Row Name 08/27/18 1043           ITP Comments  Ms. Kramm is a pleasant 84 year female who has been through physical therapy to get stronger for pulmonary rehab.           Comments: ITP REVIEW Pt is making expected progress toward pulmonary rehab goals after completing 3 sessions. Recommend continued exercise, life style modification, education, and utilization of breathing techniques to increase stamina and strength and decrease shortness of breath with exertion.

## 2018-09-11 ENCOUNTER — Encounter: Payer: Self-pay | Admitting: Cardiothoracic Surgery

## 2018-09-11 ENCOUNTER — Encounter (HOSPITAL_COMMUNITY): Payer: Medicare Other

## 2018-09-11 ENCOUNTER — Inpatient Hospital Stay: Payer: Medicare Other | Attending: Hematology and Oncology

## 2018-09-11 ENCOUNTER — Ambulatory Visit (INDEPENDENT_AMBULATORY_CARE_PROVIDER_SITE_OTHER): Payer: Medicare Other | Admitting: Cardiothoracic Surgery

## 2018-09-11 ENCOUNTER — Ambulatory Visit (HOSPITAL_COMMUNITY)
Admission: RE | Admit: 2018-09-11 | Discharge: 2018-09-11 | Disposition: A | Discharge status: Home / Self Care | Payer: Medicare Other | Source: Ambulatory Visit | Attending: Hematology and Oncology | Admitting: Hematology and Oncology

## 2018-09-11 ENCOUNTER — Ambulatory Visit
Admission: RE | Admit: 2018-09-11 | Discharge: 2018-09-11 | Disposition: A | Discharge status: Home / Self Care | Payer: Medicare Other | Source: Ambulatory Visit | Attending: Cardiothoracic Surgery | Admitting: Cardiothoracic Surgery

## 2018-09-11 ENCOUNTER — Inpatient Hospital Stay: Payer: Medicare Other

## 2018-09-11 VITALS — BP 119/56 | HR 82 | Resp 16 | Ht 64.0 in | Wt 171.0 lb

## 2018-09-11 DIAGNOSIS — Z9689 Presence of other specified functional implants: Secondary | ICD-10-CM | POA: Diagnosis not present

## 2018-09-11 DIAGNOSIS — Z79811 Long term (current) use of aromatase inhibitors: Secondary | ICD-10-CM | POA: Diagnosis not present

## 2018-09-11 DIAGNOSIS — R971 Elevated cancer antigen 125 [CA 125]: Secondary | ICD-10-CM | POA: Diagnosis not present

## 2018-09-11 DIAGNOSIS — N189 Chronic kidney disease, unspecified: Secondary | ICD-10-CM | POA: Diagnosis not present

## 2018-09-11 DIAGNOSIS — Z79899 Other long term (current) drug therapy: Secondary | ICD-10-CM | POA: Diagnosis not present

## 2018-09-11 DIAGNOSIS — D638 Anemia in other chronic diseases classified elsewhere: Secondary | ICD-10-CM | POA: Diagnosis not present

## 2018-09-11 DIAGNOSIS — J90 Pleural effusion, not elsewhere classified: Secondary | ICD-10-CM

## 2018-09-11 DIAGNOSIS — M129 Arthropathy, unspecified: Secondary | ICD-10-CM | POA: Diagnosis not present

## 2018-09-11 DIAGNOSIS — C562 Malignant neoplasm of left ovary: Secondary | ICD-10-CM | POA: Insufficient documentation

## 2018-09-11 DIAGNOSIS — K649 Unspecified hemorrhoids: Secondary | ICD-10-CM | POA: Diagnosis not present

## 2018-09-11 DIAGNOSIS — Z5111 Encounter for antineoplastic chemotherapy: Secondary | ICD-10-CM | POA: Diagnosis not present

## 2018-09-11 DIAGNOSIS — I2510 Atherosclerotic heart disease of native coronary artery without angina pectoris: Secondary | ICD-10-CM | POA: Diagnosis not present

## 2018-09-11 DIAGNOSIS — J449 Chronic obstructive pulmonary disease, unspecified: Secondary | ICD-10-CM | POA: Insufficient documentation

## 2018-09-11 DIAGNOSIS — R110 Nausea: Secondary | ICD-10-CM | POA: Diagnosis not present

## 2018-09-11 DIAGNOSIS — I700 Atherosclerosis of aorta: Secondary | ICD-10-CM | POA: Diagnosis not present

## 2018-09-11 DIAGNOSIS — Z7982 Long term (current) use of aspirin: Secondary | ICD-10-CM | POA: Insufficient documentation

## 2018-09-11 DIAGNOSIS — R188 Other ascites: Secondary | ICD-10-CM | POA: Insufficient documentation

## 2018-09-11 DIAGNOSIS — G893 Neoplasm related pain (acute) (chronic): Secondary | ICD-10-CM | POA: Insufficient documentation

## 2018-09-11 DIAGNOSIS — Z90710 Acquired absence of both cervix and uterus: Secondary | ICD-10-CM | POA: Insufficient documentation

## 2018-09-11 DIAGNOSIS — Z90722 Acquired absence of ovaries, bilateral: Secondary | ICD-10-CM | POA: Insufficient documentation

## 2018-09-11 LAB — CBC WITH DIFFERENTIAL/PLATELET
ABS IMMATURE GRANULOCYTES: 0.09 10*3/uL — AB (ref 0.00–0.07)
Basophils Absolute: 0.1 10*3/uL (ref 0.0–0.1)
Basophils Relative: 1 %
Eosinophils Absolute: 0.3 10*3/uL (ref 0.0–0.5)
Eosinophils Relative: 4 %
HCT: 29.7 % — ABNORMAL LOW (ref 36.0–46.0)
HEMOGLOBIN: 9.3 g/dL — AB (ref 12.0–15.0)
Immature Granulocytes: 1 %
LYMPHS ABS: 1.2 10*3/uL (ref 0.7–4.0)
Lymphocytes Relative: 14 %
MCH: 26.1 pg (ref 26.0–34.0)
MCHC: 31.3 g/dL (ref 30.0–36.0)
MCV: 83.4 fL (ref 80.0–100.0)
MONO ABS: 0.9 10*3/uL (ref 0.1–1.0)
MONOS PCT: 11 %
NRBC: 0 % (ref 0.0–0.2)
Neutro Abs: 5.5 10*3/uL (ref 1.7–7.7)
Neutrophils Relative %: 69 %
Platelets: 237 10*3/uL (ref 150–400)
RBC: 3.56 MIL/uL — AB (ref 3.87–5.11)
RDW: 13.6 % (ref 11.5–15.5)
WBC: 8 10*3/uL (ref 4.0–10.5)

## 2018-09-11 LAB — COMPREHENSIVE METABOLIC PANEL
ALK PHOS: 59 U/L (ref 38–126)
ALT: 9 U/L (ref 0–44)
AST: 16 U/L (ref 15–41)
Albumin: 3.1 g/dL — ABNORMAL LOW (ref 3.5–5.0)
Anion gap: 10 (ref 5–15)
BUN: 15 mg/dL (ref 8–23)
CALCIUM: 8.4 mg/dL — AB (ref 8.9–10.3)
CO2: 26 mmol/L (ref 22–32)
CREATININE: 1.25 mg/dL — AB (ref 0.44–1.00)
Chloride: 100 mmol/L (ref 98–111)
GFR calc non Af Amer: 40 mL/min — ABNORMAL LOW (ref >60)
GFR, EST AFRICAN AMERICAN: 46 mL/min — AB (ref >60)
Glucose, Bld: 117 mg/dL — ABNORMAL HIGH (ref 70–99)
Potassium: 3.8 mmol/L (ref 3.5–5.1)
SODIUM: 136 mmol/L (ref 135–145)
Total Bilirubin: <0.2 mg/dL — ABNORMAL LOW (ref 0.3–1.2)
Total Protein: 6.8 g/dL (ref 6.5–8.1)

## 2018-09-11 MED ORDER — HEPARIN SOD (PORK) LOCK FLUSH 100 UNIT/ML IV SOLN
INTRAVENOUS | Status: AC
  Filled 2018-09-11: qty 5

## 2018-09-11 MED ORDER — SODIUM CHLORIDE 0.9% FLUSH
10.0000 mL | Freq: Once | INTRAVENOUS | Status: AC
  Administered 2018-09-11: 10 mL
  Filled 2018-09-11: qty 10

## 2018-09-11 MED ORDER — SODIUM CHLORIDE (PF) 0.9 % IJ SOLN
INTRAMUSCULAR | Status: AC
  Filled 2018-09-11: qty 50

## 2018-09-11 MED ORDER — HEPARIN SOD (PORK) LOCK FLUSH 100 UNIT/ML IV SOLN
500.0000 [IU] | Freq: Once | INTRAVENOUS | Status: AC
  Administered 2018-09-11: 500 [IU] via INTRAVENOUS

## 2018-09-11 MED ORDER — IOHEXOL 300 MG/ML  SOLN
100.0000 mL | Freq: Once | INTRAMUSCULAR | Status: AC | PRN
  Administered 2018-09-11: 100 mL via INTRAVENOUS

## 2018-09-11 NOTE — Progress Notes (Signed)
[address]       [place] [address]             [phone]      [name] Medical Record #[id number] Date of Birth: 01-09-1933  Referring: Collene Gobble, MD Primary Care: Asencion Noble, MD Primary Cardiologist: Rozann Lesches, MD   Chief Complaint:   POST OP FOLLOW UP 06/24/2018 PREOPERATIVE DIAGNOSIS: Recurrent right pleural effusion with history of ovarian carcinoma. POSTOPERATIVE DIAGNOSIS: Recurrent right pleural effusion with history of ovarian carcinoma. SURGICAL PROCEDURE: Placement of right PleurX catheter with ultrasound and fluoroscopic guidance History of Present Illness:     Patient continues to feel well, the drainage has decreased some, but still is around 100 mL every 4 days.    Past Medical History:  Diagnosis Date  . Anxiety   . Chronic blood loss anemia    03-04-2018 diverticular bleed and rectal bleeding,  transfused 2 units PRBCs 03-08-2018  . Colitis   . COPD (chronic obstructive pulmonary disease) (HCC)    Dr. Lamonte Sakai  . Depression   . Diastolic CHF, chronic ([facility])   . Diverticulosis   . Family history of colon cancer   . Fibromyalgia   . Genetic testing 04/07/2018   MyRisk (35 genes) @ [facility] - No pathogenic mutations detected  . GERD (gastroesophageal reflux disease)   . Hemorrhoids   . Hiatal hernia   . Hip pain 07/2018   Right Hip Pain  . History of rectal polyps   . History of shingles   . Hypothyroidism   . IBS (irritable bowel syndrome)   . Lymphocytic colitis    Dr. Henrene Pastor  . Malignant ascites    Admission 06/2017 abdominal s/p parencentesis 07-01-2017 2.5L, 07-08-2016  2.7L, 07-12-2017  1464m  . Neuropathy due to chemotherapeutic drug ([facility]   . Osteoporosis   . Ovarian cancer ([facility]    Chemotherapy - Dr. GAlvy Bimler . Paroxysmal atrial fibrillation ([facility]    Xarelto stopped 03-07-2018 due to lower GI bleed  . Pleural effusion    s/p  right thoracentesis, 02-2018 1.3L and 03-17-2018 right  thoracentesis 6427m, post cxr no residual effusion  . [place]iatic arthritis ([facility]  . Schatzki's ring 2013  . Seasonal allergic rhinitis      Social History   Tobacco Use  Smoking Status Former Smoker  . Packs/day: 1.00  . Years: 20.00  . Pack years: 20.00  . Types: Cigarettes  . Last attempt to quit: 10/08/1980  . Years since quitting: 37.9  Smokeless Tobacco Never Used    Social History   Substance and Sexual Activity  Alcohol Use Yes   Comment: rarely, 12-23-15 rarely     Allergies  Allergen Reactions  . Codeine Itching    Current Outpatient Medications  Medication Sig Dispense Refill  . albuterol (PROAIR HFA) 108 (90 Base) MCG/ACT inhaler Inhale 2 puffs into the lungs every 6 (six) hours as needed for wheezing or shortness of breath. 1 Inhaler 3  . ALPRAZolam (XANAX) 0.25 MG tablet Take 1 tablet (0.25 mg total) by mouth at bedtime as needed for anxiety. 5 tablet 0  . amoxicillin (AMOXIL) 500 MG capsule TAKE 4 CAPSULES PO 1 HOUR PRIOR TO DENTAL APPOINTMENT  1  . azelastine (ASTELIN) 0.1 % nasal spray Place 2 sprays into both nostrils 2 (two) times daily as needed for allergies.     . Cholecalciferol (VITAMIN D) 2000 units CAPS Take 2,000 Units by mouth daily.     .Marland Kitchen  dicyclomine (BENTYL) 10 MG capsule Take 1 tab by mouth every morning. May take twice daily as needed. (Patient taking differently: Take 10 mg by mouth 2 (two) times daily. ) 60 capsule 6  . diltiazem (CARDIZEM CD) 120 MG 24 hr capsule Take 1 capsule (120 mg total) by mouth every evening. (Patient taking differently: Take 120 mg by mouth every evening. ) 90 capsule 3  . diphenhydrAMINE (BENADRYL) 25 mg capsule Take 25 mg by mouth daily as needed for itching.     . diphenoxylate-atropine (LOMOTIL) 2.5-0.025 MG tablet Take 1 tablet by mouth 4 (four) times daily. (Patient taking differently: Take 1 tablet by mouth 4 (four) times daily as needed for diarrhea or loose stools. ) 360 tablet 1  . DULoxetine (CYMBALTA) 60  MG capsule Take 30 mg by mouth daily.     . fluticasone (FLONASE) 50 MCG/ACT nasal spray Place 2 sprays into both nostrils 2 (two) times daily as needed (FOR NASAL CONGESTION.).     Marland Kitchen furosemide (LASIX) 20 MG tablet Take 2 tablets (40 mg total) by mouth daily. 180 tablet 3  . halobetasol (ULTRAVATE) 0.05 % cream Apply 1 application topically 2 (two) times daily as needed (psoriasis).     Marland Kitchen letrozole (FEMARA) 2.5 MG tablet TAKE 1 TABLET(2.5 MG) BY MOUTH DAILY 30 tablet 0  . levothyroxine (SYNTHROID, LEVOTHROID) 137 MCG tablet Take 137 mcg by mouth daily before breakfast. For thyroid therapy    . mirtazapine (REMERON) 15 MG tablet TAKE 1 TABLET(15 MG) BY MOUTH AT BEDTIME (Patient taking differently: Take 15 mg by mouth at bedtime. ) 30 tablet 11  . oxyCODONE 10 MG TABS Take 1 tablet (10 mg total) by mouth every 6 (six) hours as needed for severe pain. 60 tablet 0  . polyvinyl alcohol (LIQUIFILM TEARS) 1.4 % ophthalmic solution Place 1 drop into both eyes 2 (two) times daily as needed for dry eyes.     . potassium chloride SA (K-DUR,KLOR-CON) 20 MEQ tablet Take 10 mEq by mouth daily.    . rivaroxaban (XARELTO) 20 MG TABS tablet Take 20 mg by mouth daily with supper.    Marland Kitchen STIOLTO RESPIMAT 2.5-2.5 MCG/ACT AERS INHALE 2 PUFFS INTO THE LUNGS DAILY 4 g 5  . Ustekinumab (STELARA [place]) Inject 1 Dose into the skin every 3 (three) months.     No current facility-administered medications for this visit.    Facility-Administered Medications Ordered in Other Visits  Medication Dose Route Frequency Provider Last Rate Last Dose  . heparin lock flush 100 UNIT/ML injection           . sodium chloride (PF) 0.9 % injection                Physical Exam: Ht 5' 4"  (1.626 m)   Wt 171 lb (77.6 kg)   BMI 29.35 kg/m  General appearance: alert, cooperative and appears stated age Head: Normocephalic, without obvious abnormality, atraumatic Lymph nodes: Cervical, supraclavicular, and axillary nodes normal. Resp:  clear to auscultation bilaterally Cardio: regular rate and rhythm, S1, S2 normal, no murmur, click, rub or gallop GI: soft, non-tender; bowel sounds normal; no masses,  no organomegaly Extremities: extremities normal, atraumatic, no cyanosis or edema and Homans sign is negative, no sign of DVT Neurologic: Grossly normal Right Pleurx is in place and without signs of infection, drain today in the office approximately 110 mL.  Diagnostic Studies & Laboratory data:     Recent Radiology Findings:   Dg Chest 2 View  Result  Date: 09/11/2018 CLINICAL DATA:  Shortness of breath. Pleural effusion. EXAM: CHEST - 2 VIEW COMPARISON:  08/14/2018 FINDINGS: Right jugular Port-A-Cath remains in place terminating over the lower SVC, unchanged. A right-sided tunneled pleural catheter catheter is unchanged. The cardiomediastinal silhouette is unchanged with normal heart size. Aortic atherosclerosis is noted. The lungs remain hyperinflated with minimal scarring or atelectasis in the lung bases. No acute airspace consolidation or edema is identified. There is likely a trace right apical pneumothorax, and there is at most a trace right pleural effusion. No acute osseous abnormality is identified. IMPRESSION: Trace right apical pneumothorax and at most a trace right pleural effusion with right pleural catheter in place. No evidence of acute airspace disease. Electronically Signed   By: Logan Bores M.D.   On: 09/11/2018 14:35    Dg Chest 2 View  Result Date: 08/14/2018 CLINICAL DATA:  Follow-up right pleural effusion.  COPD. EXAM: CHEST - 2 VIEW COMPARISON:  07/16/2018 chest radiograph. FINDINGS: Right basilar chest tube terminates in the medial upper right pleural space, unchanged. Right internal jugular MediPort terminates in the lower third of the SVC. Stable cardiomediastinal silhouette with normal heart size. No convincing pneumothorax. No significant pleural effusion. Hyperinflated lungs. No pulmonary edema. No acute  consolidative airspace disease. Stable minimal bibasilar scarring versus atelectasis. IMPRESSION: 1. Stable right chest tube with no appreciable residual pneumothorax. 2. No significant pleural effusion. 3. Stable minimal bibasilar scarring versus atelectasis. 4. Hyperinflated lungs, compatible with the provided history of COPD. Electronically Signed   By: Ilona Sorrel M.D.   On: 08/14/2018 09:40   DCt Abdomen Pelvis W Contrast  Result Date: 07/23/2018 CLINICAL DATA:  Ovarian cancer, diagnosed 07/2015, status post surgery with adjunct chemotherapy. Oral chemotherapy ongoing. Recurrent pleural effusions. EXAM: CT ABDOMEN AND PELVIS WITH CONTRAST TECHNIQUE: Multidetector CT imaging of the abdomen and pelvis was performed using the standard protocol following bolus administration of intravenous contrast. CONTRAST:  118m OMNIPAQUE IOHEXOL 300 MG/ML  SOLN COMPARISON:  04/21/2018 FINDINGS: Lower chest: Trace right pleural effusion with indwelling pleural drain. Hepatobiliary: Liver is within normal limits. Status post cholecystectomy. No intrahepatic ductal dilatation. Dilated common duct, measuring 11 mm, chronic. Pancreas: Within normal limits. Spleen: Within normal limits. Adrenals/Urinary Tract: Adrenal glands within normal limits. Kidneys are within normal limits.  No hydronephrosis. Bladder is underdistended but unremarkable. Stomach/Bowel: Stomach is within normal limits. No evidence of bowel obstruction. Very mild sigmoid diverticulosis. Vascular/Lymphatic: No evidence of abdominal aortic aneurysm. Atherosclerotic calcifications of the abdominal aorta and branch vessels. No suspicious abdominopelvic lymphadenopathy. Reproductive: Status post hysterectomy and bilateral salpingo-oophorectomy. Other: No abdominopelvic ascites. Mild peritoneal thickening/nodularity on the left (series 2/images 58-59). Mild soft tissue nodularity along the right pelvic cul-de-sac adjacent to the rectum (series 2/image 58). While  equivocal, these findings are worrisome for very mild peritoneal disease. Musculoskeletal: Degenerative changes of the visualized thoracolumbar spine. IMPRESSION: Status post hysterectomy and bilateral salpingo-oophorectomy. Very mild peritoneal thickening/nodularity, equivocal but worrisome for very mild peritoneal disease. Attention on follow-up is suggested. Small right pleural effusion with indwelling pleural drain. Electronically Signed   By: SJulian HyM.D.   On: 07/23/2018 08:37    Recent Lab Findings: Lab Results  Component Value Date   WBC 8.0 09/11/2018   HGB 9.3 (L) 09/11/2018   HCT 29.7 (L) 09/11/2018   PLT 237 09/11/2018   GLUCOSE 117 (H) 09/11/2018   CHOL 147 11/14/2017   ALT 9 09/11/2018   AST 16 09/11/2018   NA 136 09/11/2018   K 3.8  09/11/2018   CL 100 09/11/2018   CREATININE 1.25 (H) 09/11/2018   BUN 15 09/11/2018   CO2 26 09/11/2018   TSH 0.388 11/10/2017   INR 1.09 06/24/2018   HGBA1C 5.7 (H) 06/12/2013      Assessment / Plan:   Stable functioning of right Pleurx catheter-keeping the effusion from causing her symptoms.  The patient would like to have the catheter removed but it is still draining more than would like to see before removal.  We will see her back in 3 to 4 weeks with a follow-up chest x-ray continue with drainage every 4 days.     Grace Isaac MD      [address] [place] [address] Office [phone]   Beeper [phone]  09/11/2018 2:52 PM

## 2018-09-12 ENCOUNTER — Encounter: Payer: Self-pay | Admitting: Hematology and Oncology

## 2018-09-12 ENCOUNTER — Inpatient Hospital Stay (HOSPITAL_BASED_OUTPATIENT_CLINIC_OR_DEPARTMENT_OTHER): Payer: Medicare Other | Admitting: Hematology and Oncology

## 2018-09-12 ENCOUNTER — Telehealth: Payer: Self-pay | Admitting: Hematology and Oncology

## 2018-09-12 VITALS — BP 140/55 | HR 111 | Temp 98.0°F | Resp 20 | Ht 64.0 in | Wt 172.6 lb

## 2018-09-12 DIAGNOSIS — R188 Other ascites: Secondary | ICD-10-CM

## 2018-09-12 DIAGNOSIS — Z7189 Other specified counseling: Secondary | ICD-10-CM

## 2018-09-12 DIAGNOSIS — J449 Chronic obstructive pulmonary disease, unspecified: Secondary | ICD-10-CM

## 2018-09-12 DIAGNOSIS — Z79811 Long term (current) use of aromatase inhibitors: Secondary | ICD-10-CM

## 2018-09-12 DIAGNOSIS — C562 Malignant neoplasm of left ovary: Secondary | ICD-10-CM

## 2018-09-12 DIAGNOSIS — D638 Anemia in other chronic diseases classified elsewhere: Secondary | ICD-10-CM

## 2018-09-12 DIAGNOSIS — J90 Pleural effusion, not elsewhere classified: Secondary | ICD-10-CM | POA: Diagnosis not present

## 2018-09-12 DIAGNOSIS — I2510 Atherosclerotic heart disease of native coronary artery without angina pectoris: Secondary | ICD-10-CM

## 2018-09-12 DIAGNOSIS — N189 Chronic kidney disease, unspecified: Secondary | ICD-10-CM

## 2018-09-12 DIAGNOSIS — Z90710 Acquired absence of both cervix and uterus: Secondary | ICD-10-CM

## 2018-09-12 DIAGNOSIS — M129 Arthropathy, unspecified: Secondary | ICD-10-CM

## 2018-09-12 DIAGNOSIS — R971 Elevated cancer antigen 125 [CA 125]: Secondary | ICD-10-CM

## 2018-09-12 DIAGNOSIS — Z7982 Long term (current) use of aspirin: Secondary | ICD-10-CM

## 2018-09-12 DIAGNOSIS — G893 Neoplasm related pain (acute) (chronic): Secondary | ICD-10-CM

## 2018-09-12 DIAGNOSIS — Z79899 Other long term (current) drug therapy: Secondary | ICD-10-CM

## 2018-09-12 DIAGNOSIS — Z5111 Encounter for antineoplastic chemotherapy: Secondary | ICD-10-CM | POA: Diagnosis not present

## 2018-09-12 DIAGNOSIS — I700 Atherosclerosis of aorta: Secondary | ICD-10-CM

## 2018-09-12 DIAGNOSIS — Z90722 Acquired absence of ovaries, bilateral: Secondary | ICD-10-CM

## 2018-09-12 LAB — CA 125: CANCER ANTIGEN (CA) 125: 1323 U/mL — AB (ref 0.0–38.1)

## 2018-09-12 MED ORDER — OXYCODONE HCL 10 MG PO TABS: 10.0000 mg | ORAL_TABLET | Freq: Four times a day (QID) | ORAL | 0 refills | Status: DC | PRN

## 2018-09-12 NOTE — Assessment & Plan Note (Signed)
She has multifactorial anemia Her blood count is satisfactory today She denies recent bleeding complications

## 2018-09-12 NOTE — Telephone Encounter (Signed)
Gave patient avs and calendar.   °

## 2018-09-12 NOTE — Progress Notes (Signed)
[place] OFFICE PROGRESS NOTE  Patient Care Team: Asencion Noble, MD as PCP - General (Internal Medicine) Satira Sark, MD as PCP - Cardiology (Cardiology) Ahmed Prima, Fransisco Hertz, PA-C as Physician Assistant (Physician Assistant) Heath Lark, MD as Consulting Physician (Hematology and Oncology) Collene Gobble, MD as Consulting Physician (Pulmonary Disease)  ASSESSMENT & PLAN:  Left ovarian epithelial cancer [facility]) Unfortunately, the patient have clinical signs and symptoms of disease progression along with abnormal imaging study We discussed treatment options Treatment is strictly palliative in nature We discussed potential treatment with single agent carboplatin, pemetrexed or combination treatment with carboplatin and gemcitabine and she elected for combination treatment.  We discussed the role of chemotherapy is of palliative intent, in accordance with NCCN guidelines, based on publication below: Int J Gynecol Cancer. 2005 May-Jun;15 Suppl 1:36-41. Combination therapy with gemcitabine and carboplatin in recurrent ovarian cancer. Pfisterer J1, [place] I, [place] A, Eisenhauer E; AGO-OVAR,; NCIC CTG; EORTC GCG.  Participants with recurrent platinum-sensitive ovarian cancer were randomly assigned to receive either gemcitabine-carboplatin or carboplatin every 21 days. The primary objective was to compare progression-free survival (PFS) between the groups. From September 1999 to April 2002, 356 patients (178 participants received gemcitabine-carboplatin, 178 received carboplatin only) were randomized to treatment. Patients received six cycles of either gemcitabine-carboplatin or carboplatin. With a median follow-up of 17 months, median PFS was 8.6 months for gemcitabine-carboplatin (95% confidence interval [CI] 7.9-9.7 months) and 5.8 months for carboplatin (95% CI 5.2-7.1 months; hazard ratio [HR] 0.72 [95% CI 0.58-0.90; P = 0.0032]). The response rate for the  gemcitabine-carboplatin group was 47.2% (95% CI 39.9-54.5%) and 30.9% for carboplatin group (95% CI 24.1-37.7%; P = 0.0016). The HR for overall survival was 0.96 (95% CI 0.75-1.23; P = 0.7349). Patients treated with gemcitabine-carboplatin reported significantly faster palliation of abdominal symptoms and a significantly improved global quality of life. Gemcitabine-carboplatin treatment significantly improves the PFS of patients with platinum-sensitive recurrent ovarian cancer.  We discussed the role of chemotherapy. The intent is of palliative intent.  We discussed some of the risks, benefits, side-effects of carboplatin & gemcitabine  Some of the short term side-effects included, though not limited to, including weight loss, life threatening infections, risk of allergic reactions, need for transfusions of blood products, nausea, vomiting, change in bowel habits, loss of hair, admission to hospital for various reasons, and risks of death.   Long term side-effects are also discussed including risks of infertility, permanent damage to nerve function, hearing loss, chronic fatigue, kidney damage with possibility needing hemodialysis, and rare secondary malignancy including bone marrow disorders.  The patient is aware that the response rates discussed earlier is not guaranteed.  After a long discussion, patient made an informed decision to proceed with the prescribed plan of care.   Patient education material was dispensed Due to her age, other comorbidities, chronic kidney disease, I plan to reduce the dose of gemcitabine drastically at 50% dose She will get carboplatin on day 1 and gemcitabine on day 1 and 8 I will see her on day 8 of treatment    Anemia, chronic disease She has multifactorial anemia Her blood count is satisfactory today She denies recent bleeding complications  Cancer associated pain She has significant chronic arthritis pain and cancer pain We will continue chronic pain  management along with Cymbalta  Pleural effusion She has persistent pleural effusion that could be related to disease She will continue management by cardiothoracic surgery.  Goals of care, counseling/discussion The patient is aware she  has incurable disease and treatment is strictly palliative. We discussed importance of Advanced Directives and Living will.    Orders Placed This Encounter  Procedures  . Magnesium    Standing Status:   Standing    Number of Occurrences:   9    Standing Expiration Date:   09/13/2019    INTERVAL HISTORY: Please see below for problem oriented charting. She returns with her daughter for further follow-up She has been complaining of chronic intermittent discomfort and frequent bowel movement with liquid stool She continues to have intermittent shortness of breath and has persistent pleural drain present No recent infection, fever or chills She has chronic arthritis pain, stable The patient denies any recent signs or symptoms of bleeding such as spontaneous epistaxis, hematuria or hematochezia.  SUMMARY OF ONCOLOGIC HISTORY: Oncology History   High grade serous ER 90%, PR 0% BRCA 1: no loss of expression MMR normal      Left ovarian epithelial cancer ([place])   02/18/2016 Tumor Marker    Patient's tumor was tested for the following markers: CA125 Results of the tumor marker test revealed 45    05/22/2016 Tumor Marker    Patient's tumor was tested for the following markers: CA125 Results of the tumor marker test revealed 53    05/22/2016 Imaging    Outside pelvic US showed 4.1 cm adnexa mass    06/24/2017 Imaging    Ct abdomen and pelvis:  1. Interim finding of moderate ascites within the abdomen and pelvis with additional finding of diffuse nodular infiltration of the omentum and anterior mesenteric fat, the appearance would be consistent with peritoneal carcinomatosis/metastatic disease. Increasing retroperitoneal and upper abdominal  adenopathy. 2. Re- demonstrated 3.8 cm cyst in the right adnexa. Enlarging soft tissue density in the left adnexa now with possible cystic component posteriorly. In light of the above findings, concern is for ovarian neoplasm. Correlation with pelvic ultrasound recommended. 3. Small right-sided pleural effusion, new since prior study 4. Stable hypodense splenic lesions since 2017.     06/25/2017 Imaging    US pelvis: 2.9 cm simple appearing cyst in the right ovary. Left ovary grossly unremarkable. Large volume ascites in the pelvis    06/30/2017 - 07/01/2017 Hospital Admission    She was admitted for evaluation of abdominal pain and ascites    07/01/2017 Pathology Results    PERITONEAL/ASCITIC FLUID(SPECIMEN 1 OF 1 COLLECTED 07/01/17): - POORLY DIFFERENTIATED CARCINOMA; SEE COMMENT Source Peritoneal/Ascitic Fluid, (specimen 1 of 1 collected 07/01/17) Gross Specimen: Received is/are 1000 cc's of brownish fluid. (BS:bs) Prepared: # Smears: 0 # Concentration Technique Slides (i.e. ThinPrep): 1 # Cell Block: 1 Additional Studies: Also received Hematology slide - [id number]. Comment The tumor cells are positive for cytokeratin 7 and Pax-8 but negative for cytokeratin 20, CDX-2, GATA-3, Napsin-A and TTF-1. Based on the immunoprofile a gynecology primary is favored    07/01/2017 Procedure    Successful ultrasound-guided diagnostic and therapeutic paracentesis yielding 2.5 liters of peritoneal fluid    07/07/2017 - 07/09/2017 Hospital Admission    She was admitted for management of malignant ascites    07/08/2017 Procedure    Successful ultrasound-guided therapeutic paracentesis yielding 2.7 liters liters of peritoneal fluid    07/12/2017 Procedure    Successful ultrasound-guided paracentesis yielding 1450 mL of peritoneal fluid    07/18/2017 - 07/24/2017 Hospital Admission    She was admitted for expedited treatment    07/18/2017 Tumor Marker    Patient's tumor was tested for the following  markers: CA125  Results of the tumor marker test revealed 1941    07/19/2017 - 02/04/2018 Chemotherapy    The patient had 6 cycles of carboplatin & Taxol for chemotherapy treatment, followed by 3 more cycles of carboplatin only     07/19/2017 - 02/04/2018 Chemotherapy    The patient had carboplatin and taxol    08/06/2017 Procedure    Successful ultrasound-guided therapeutic paracentesis yielding 2.6 liters of peritoneal fluid.    08/09/2017 Tumor Marker    Patient's tumor was tested for the following markers: CA125 Results of the tumor marker test revealed 1665    08/15/2017 Tumor Marker    Patient's tumor was tested for the following markers: CA125 Results of the tumor marker test revealed 937.9    08/20/2017 Imaging    ECHO: Normal LV size with EF 60-65%. Normal RV size and systolic function. No significant valvular abnormalities.    09/18/2017 Imaging    Chest Impression:  1. No evidence thoracic metastasis. 2. Interval increase and RIGHT pleural effusion.  Abdomen / Pelvis Impression:  1. Interval decrease in intraperitoneal free fluid. 2. Interval decrease in omental nodularity in the LEFT ventral peritoneal space. 3. Interval decrease in nodularity associated with the LEFT ovary. 4. Cystic portion of the RIGHT ovary is increased mildly in size.    09/20/2017 Tumor Marker    Patient's tumor was tested for the following markers: CA125 Results of the tumor marker test revealed 347    10/14/2017 Tumor Marker    Patient's tumor was tested for the following markers: CA125 Results of the tumor marker test revealed 307.4    11/04/2017 Tumor Marker    Patient's tumor was tested for the following markers: CA125 Results of the tumor marker test revealed 262.5    11/28/2017 Imaging    1. Interval decrease in right pleural effusion with resolution of right atelectasis seen previously. 2. New small left pleural effusion, symmetric to the right. 3. No intraperitoneal free fluid on  the current study. 4. Continued further decrease in left omental disease, appearing less confluent today than on the prior study. 5. Left ovary remains normal in appearance today and the right adnexal cystic lesion is decreased in size compared to prior study. 6. 14 mm pancreatic cyst is unchanged. Continued attention on follow-up imaging recommended. 7. Aortic Atherosclerois (ICD10-170.0)    12/13/2017 Tumor Marker    Patient's tumor was tested for the following markers: CA125 Results of the tumor marker test revealed 197.7    01/03/2018 Tumor Marker    Patient's tumor was tested for the following markers: CA125 Results of the tumor marker test revealed 183.1    01/14/2018 Tumor Marker    Patient's tumor was tested for the following markers: CA125 Results of the tumor marker test revealed 177.4    02/04/2018 Tumor Marker    Patient's tumor was tested for the following markers: CA125 Results of the tumor marker test revealed 168.5    02/25/2018 Imaging    1. Omental carcinomatosis appears qualitatively stable to slightly decreased. Stable mild peritoneal thickening in the paracolic gutters. Stable right adnexal cyst. No ascites. No new or progressive metastatic disease in the abdomen or pelvis. 2. Small dependent right pleural effusion is increased. 3. Cystic pancreatic body lesion is decreased and now subcentimeter in size, suggesting a benign lesion. 4. Aortic Atherosclerosis (ICD10-I70.0).    03/03/2018 - 03/07/2018 Hospital Admission    She was hospitalized for GI bleed requiring blood transfusions. Xarelto was placed on hold    03/07/2018 PET  scan    1. Persistent hazy omental interstitial nodularity but no hypermetabolism or discrete measurable nodules. No abdominal ascites. 2. No findings for metastatic disease involving the chest. 3. Moderate-sized right pleural effusion and small left pleural effusion.     03/20/2018 Pathology Results    1. Ovary and fallopian tube, right -  OVARY AND FALLOPIAN TUBE INVOLVED BY SEROUS CARCINOMA. - PARATUBAL CYST. 2. Uterus +/- tubes/ovaries, neoplastic, cervix, left ovary and fallopian tube - LEFT OVARY: HIGH GRADE SEROUS CARCINOMA WITH TREATMENT EFFECT, SPANNING 2.5 CM. CARCINOMA INVOLVES OVARIAN SURFACE. SEE ONCOLOGY TABLE. - LEFT FALLOPIAN TUBE: INVOLVED BY SEROUS CARCINOMA. - UTERUS: -ENDOMETRIUM: INACTIVE ENDOMETRIUM. NO HYPERPLASIA OR MALIGNANCY. -MYOMETRIUM: UNREMARKABLE. NO MALIGNANCY. -SEROSA: INVOLVED BY SEROUS CARCINOMA. - CERVIX: ENDOCERVICAL POLYP. NO MALIGNANCY. 3. Omentum, resection for tumor - INVOLVED BY SEROUS CARCINOMA. 4. Soft tissue, biopsy, mesenteric nodule - INVOLVED BY SEROUS CARCINOMA. Microscopic Comment 2. OVARY or FALLOPIAN TUBE or PRIMARY PERITONEUM: Procedure: Total hysterectomy and bilateral salpingo-oophorectomy. Omentectomy. Mesenteric lymph node biopsy. Specimen Integrity: Intact. Tumor Site: Left ovary. Ovarian Surface Involvement (required only if applicable): Present. Fallopian Tube Surface Involvement (required only if applicable): Present, bilateral. Tumor Size: 2.5 cm. Histologic Type: High grade serous carcinoma. Histologic Grade: High grade. Implants (required for advanced stage serous/seromucinous borderline tumors only): N/A. Other Tissue/ Organ Involvement: Bilateral fallopian tubes, right ovary, uterine serosa, omentum. Largest Extrapelvic Peritoneal Focus (required only if applicable): Microscopic, estimated 0.5 cm (omentum). Peritoneal/Ascitic Fluid: Prior Positive ([id number]). Treatment Effect (required only for high-grade serous carcinomas): Present in left ovary. CRS2. Regional Lymph Nodes: No lymph nodes submitted/identified. Pathologic Stage Classification (pTNM, AJCC 8th Edition): ypT3b, ypNX Representative Tumor Block: 1A, 1B, 63F, 68F. Comment(s): The right ovary has only surface deposits with a large paratubal cyst. The left ovary has intraparenchymal tumor with  associated treatment effect. Thus the tumor location is classified as a left ovarian primary.    03/20/2018 Surgery    Procedure(s) Performed:  1. Exploratory laparotomy with total hysterectomy and bilateral salpingo-oophorectomy 2. Infragastic Omentectomy  3. Debulking to <1cm gross residual disease   Surgeon: Mart Piggs, MD  Specimens: Uterus Cervix, Bilateral tubes / ovaries and omentum. Mesenteric nodule.  Operative Findings: Debulked to gross residual disease <1cm; however there is miliary disease in multiple locations including the majority of the abdominal peritoneum (anterior abdominal wall, bilateral gutters), diaphragm (Right>left), majority of small bowel mesentary. Normal appendix. Normal small uterus. Right ovary with a cystic lesion ~3cm, some adhesive disease of right adnexa to rectum/sigmoid. Gross omental disease, which was resected with the omentectomy. Smooth liver surface, but again, diaphragmatic disease noted.       03/20/2018 Genetic Testing    Patient has genetic testing done for ER/PR. Results revealed patient has ER: 90%, PR 0%.     03/31/2018 Tumor Marker    Patient's tumor was tested for the following markers: CA125 Results of the tumor marker test revealed 215.8     Genetic Testing    Patient has genetic testing done for BRCA 1. Results revealed patient has the following: BRCA 1: no loss of expression.     Genetic Testing    Patient has genetic testing done for MMR . Results revealed patient has the following:  MMR: normal    03/31/2018 Genetic Testing    Patient has genetic testing done for BRCA1/2. Results revealed patient has no actionable mutations. She is found to have [place] genetic change of unknown significance    04/21/2018 Imaging    1. No  definite findings of residual or recurrent metastatic disease in the abdomen or pelvis status post interval TAHBSO and omentectomy. Stable minimal thickening in the paracolic gutters  without discrete peritoneal nodularity. 2. Trace free fluid in the pelvic cul-de-sac. 3. Stable small dependent right pleural effusion. 4. Subcentimeter pancreatic body cystic lesion is stable to slightly decreased. 5. Aortic Atherosclerosis (ICD10-I70.0).    04/21/2018 Tumor Marker    Patient's tumor was tested for the following markers: CA125 Results of the tumor marker test revealed 187.3    04/23/2018 - 09/12/2018 Anti-estrogen oral therapy    She is placed on Femara    05/08/2018 Procedure    Successful ultrasound guided right thoracentesis yielding 800 mL of pleural fluid. Fluid cytology is negative for malignancy     05/28/2018 Tumor Marker    Patient's tumor was tested for the following markers: CA125 Results of the tumor marker test revealed 190    06/30/2018 Tumor Marker    Patient's tumor was tested for the following markers: CA125 Results of the tumor marker test revealed 148    07/23/2018 Imaging    Status post hysterectomy and bilateral salpingo-oophorectomy.  Very mild peritoneal thickening/nodularity, equivocal but worrisome for very mild peritoneal disease. Attention on follow-up is suggested.  Small right pleural effusion with indwelling pleural drain.    07/23/2018 Tumor Marker    Patient's tumor was tested for the following markers: CA125 Results of the tumor marker test revealed 215    09/11/2018 Tumor Marker    Patient's tumor was tested for the following markers: CA-125. Results of the tumor marker test revealed 1323    09/11/2018 -  Chemotherapy    The patient had palonosetron (ALOXI) injection 0.25 mg, 0.25 mg, Intravenous,  Once, 0 of 4 cycles CARBOplatin (PARAPLATIN) 270 mg in sodium chloride 0.9 % 100 mL chemo infusion, 270 mg (100 % of original dose 268.8 mg), Intravenous,  Once, 0 of 4 cycles Dose modification: 268.8 mg (original dose 268.8 mg, Cycle 1) gemcitabine (GEMZAR) 950 mg in sodium chloride 0.9 % 100 mL chemo infusion, 500 mg/m2 = 950 mg (50  % of original dose 1,000 mg/m2), Intravenous,  Once, 0 of 4 cycles Dose modification: 800 mg/m2 (80 % of original dose 1,000 mg/m2, Cycle 1, Reason: Dose Not Tolerated), 500 mg/m2 (50 % of original dose 1,000 mg/m2, Cycle 1, Reason: Dose Not Tolerated)  for chemotherapy treatment.     09/12/2018 Imaging    1. Mildly progressive ascites with some mild degree of nodularity most appreciable along the left adnexa, right paracolic gutter, right upper quadrant, compatible with peritoneal spread of tumor. 2. Small right pleural effusion with indwelling right pleural catheter again noted. Mild increase in atelectasis anteriorly at the right lung base. 3. Aortic Atherosclerosis (ICD10-I70.0). Coronary atherosclerosis. 4. Several tiny hypodense lesions in the spleen are technically nonspecific, but stable. 5. Air fluid level in the rectum compatible with diarrheal process. 6. Prominent bilateral hip arthropathy. Left hip screw noted.     REVIEW OF SYSTEMS:   Constitutional: Denies fevers, chills or abnormal weight loss Eyes: Denies blurriness of vision Ears, nose, mouth, throat, and face: Denies mucositis or sore throat Respiratory: Denies cough, dyspnea or wheezes Cardiovascular: Denies palpitation, chest discomfort or lower extremity swelling Skin: Denies abnormal skin rashes Lymphatics: Denies new lymphadenopathy or easy bruising Neurological:Denies numbness, tingling or new weaknesses Behavioral/Psych: Mood is stable, no new changes  All other systems were reviewed with the patient and are negative.  I have reviewed the past medical  history, past surgical history, social history and family history with the patient and they are unchanged from previous note.  ALLERGIES:  is allergic to codeine.  MEDICATIONS:  Current Outpatient Medications  Medication Sig Dispense Refill  . albuterol (PROAIR HFA) 108 (90 Base) MCG/ACT inhaler Inhale 2 puffs into the lungs every 6 (six) hours as needed for  wheezing or shortness of breath. 1 Inhaler 3  . ALPRAZolam (XANAX) 0.25 MG tablet Take 1 tablet (0.25 mg total) by mouth at bedtime as needed for anxiety. 5 tablet 0  . amoxicillin (AMOXIL) 500 MG capsule TAKE 4 CAPSULES PO 1 HOUR PRIOR TO DENTAL APPOINTMENT  1  . azelastine (ASTELIN) 0.1 % nasal spray Place 2 sprays into both nostrils 2 (two) times daily as needed for allergies.     . Cholecalciferol (VITAMIN D) 2000 units CAPS Take 2,000 Units by mouth daily.     Marland Kitchen dicyclomine (BENTYL) 10 MG capsule Take 1 tab by mouth every morning. May take twice daily as needed. (Patient taking differently: Take 10 mg by mouth 2 (two) times daily. ) 60 capsule 6  . diltiazem (CARDIZEM CD) 120 MG 24 hr capsule Take 1 capsule (120 mg total) by mouth every evening. (Patient taking differently: Take 120 mg by mouth every evening. ) 90 capsule 3  . diphenhydrAMINE (BENADRYL) 25 mg capsule Take 25 mg by mouth daily as needed for itching.     . diphenoxylate-atropine (LOMOTIL) 2.5-0.025 MG tablet Take 1 tablet by mouth 4 (four) times daily. (Patient taking differently: Take 1 tablet by mouth 4 (four) times daily as needed for diarrhea or loose stools. ) 360 tablet 1  . DULoxetine (CYMBALTA) 60 MG capsule Take 30 mg by mouth daily.     . fluticasone (FLONASE) 50 MCG/ACT nasal spray Place 2 sprays into both nostrils 2 (two) times daily as needed (FOR NASAL CONGESTION.).     Marland Kitchen furosemide (LASIX) 20 MG tablet Take 2 tablets (40 mg total) by mouth daily. 180 tablet 3  . halobetasol (ULTRAVATE) 0.05 % cream Apply 1 application topically 2 (two) times daily as needed (psoriasis).     Marland Kitchen levothyroxine (SYNTHROID, LEVOTHROID) 137 MCG tablet Take 137 mcg by mouth daily before breakfast. For thyroid therapy    . mirtazapine (REMERON) 15 MG tablet TAKE 1 TABLET(15 MG) BY MOUTH AT BEDTIME (Patient taking differently: Take 15 mg by mouth at bedtime. ) 30 tablet 11  . Oxycodone HCl 10 MG TABS Take 1 tablet (10 mg total) by mouth every  6 (six) hours as needed. 60 tablet 0  . polyvinyl alcohol (LIQUIFILM TEARS) 1.4 % ophthalmic solution Place 1 drop into both eyes 2 (two) times daily as needed for dry eyes.     . potassium chloride SA (K-DUR,KLOR-CON) 20 MEQ tablet Take 10 mEq by mouth daily.    . rivaroxaban (XARELTO) 20 MG TABS tablet Take 20 mg by mouth daily with supper.    Marland Kitchen STIOLTO RESPIMAT 2.5-2.5 MCG/ACT AERS INHALE 2 PUFFS INTO THE LUNGS DAILY 4 g 5  . Ustekinumab (STELARA [place]) Inject 1 Dose into the skin every 3 (three) months.     No current facility-administered medications for this visit.     PHYSICAL EXAMINATION: ECOG PERFORMANCE STATUS: 2 - Symptomatic, <50% confined to bed  Vitals:   09/12/18 0922  BP: (!) 140/55  Pulse: (!) 111  Resp: 20  Temp: 98 F (36.7 C)  SpO2: 94%   Filed Weights   09/12/18 0922  Weight: 172 lb  9.6 oz (78.3 kg)    GENERAL:alert, no distress and comfortable SKIN: skin color, texture, turgor are normal, no rashes or significant lesions EYES: normal, Conjunctiva are pink and non-injected, sclera clear OROPHARYNX:no exudate, no erythema and lips, buccal mucosa, and tongue normal  NECK: supple, thyroid normal size, non-tender, without nodularity LYMPH:  no palpable lymphadenopathy in the cervical, axillary or inguinal LUNGS: clear to auscultation and percussion with normal breathing effort HEART: regular rate & rhythm and no murmurs and no lower extremity edema ABDOMEN:abdomen soft, non-tender and normal bowel sounds Musculoskeletal:no cyanosis of digits and no clubbing  NEURO: alert & oriented x 3 with fluent speech, no focal motor/sensory deficits  LABORATORY DATA:  I have reviewed the data as listed    Component Value Date/Time   NA 136 09/11/2018 1009   NA 135 (L) 09/24/2017 0945   K 3.8 09/11/2018 1009   K 3.7 09/24/2017 0945   CL 100 09/11/2018 1009   CO2 26 09/11/2018 1009   CO2 30 (H) 09/24/2017 0945   GLUCOSE 117 (H) 09/11/2018 1009   GLUCOSE 101  09/24/2017 0945   BUN 15 09/11/2018 1009   BUN 15.3 09/24/2017 0945   CREATININE 1.25 (H) 09/11/2018 1009   CREATININE 1.00 03/13/2018 0940   CREATININE 0.8 09/24/2017 0945   CALCIUM 8.4 (L) 09/11/2018 1009   CALCIUM 9.1 09/24/2017 0945   PROT 6.8 09/11/2018 1009   PROT 6.6 09/24/2017 0945   ALBUMIN 3.1 (L) 09/11/2018 1009   ALBUMIN 3.3 (L) 09/24/2017 0945   AST 16 09/11/2018 1009   AST 14 03/13/2018 0940   AST 16 09/24/2017 0945   ALT 9 09/11/2018 1009   ALT 7 03/13/2018 0940   ALT 12 09/24/2017 0945   ALKPHOS 59 09/11/2018 1009   ALKPHOS 52 09/24/2017 0945   BILITOT <0.2 (L) 09/11/2018 1009   BILITOT 0.3 03/13/2018 0940   BILITOT 1.13 09/24/2017 0945   GFRNONAA 40 (L) 09/11/2018 1009   GFRNONAA 51 (L) 03/13/2018 0940   GFRAA 46 (L) 09/11/2018 1009   GFRAA 59 (L) 03/13/2018 0940    No results found for: SPEP, UPEP  Lab Results  Component Value Date   WBC 8.0 09/11/2018   NEUTROABS 5.5 09/11/2018   HGB 9.3 (L) 09/11/2018   HCT 29.7 (L) 09/11/2018   MCV 83.4 09/11/2018   PLT 237 09/11/2018      Chemistry      Component Value Date/Time   NA 136 09/11/2018 1009   NA 135 (L) 09/24/2017 0945   K 3.8 09/11/2018 1009   K 3.7 09/24/2017 0945   CL 100 09/11/2018 1009   CO2 26 09/11/2018 1009   CO2 30 (H) 09/24/2017 0945   BUN 15 09/11/2018 1009   BUN 15.3 09/24/2017 0945   CREATININE 1.25 (H) 09/11/2018 1009   CREATININE 1.00 03/13/2018 0940   CREATININE 0.8 09/24/2017 0945      Component Value Date/Time   CALCIUM 8.4 (L) 09/11/2018 1009   CALCIUM 9.1 09/24/2017 0945   ALKPHOS 59 09/11/2018 1009   ALKPHOS 52 09/24/2017 0945   AST 16 09/11/2018 1009   AST 14 03/13/2018 0940   AST 16 09/24/2017 0945   ALT 9 09/11/2018 1009   ALT 7 03/13/2018 0940   ALT 12 09/24/2017 0945   BILITOT <0.2 (L) 09/11/2018 1009   BILITOT 0.3 03/13/2018 0940   BILITOT 1.13 09/24/2017 0945       RADIOGRAPHIC STUDIES: I have reviewed recent imaging study with the patient and her  daughter I have personally reviewed the radiological images as listed and agreed with the findings in the report. Dg Chest 2 View  Result Date: 09/11/2018 CLINICAL DATA:  Shortness of breath. Pleural effusion. EXAM: CHEST - 2 VIEW COMPARISON:  08/14/2018 FINDINGS: Right jugular Port-A-Cath remains in place terminating over the lower SVC, unchanged. A right-sided tunneled pleural catheter catheter is unchanged. The cardiomediastinal silhouette is unchanged with normal heart size. Aortic atherosclerosis is noted. The lungs remain hyperinflated with minimal scarring or atelectasis in the lung bases. No acute airspace consolidation or edema is identified. There is likely a trace right apical pneumothorax, and there is at most a trace right pleural effusion. No acute osseous abnormality is identified. IMPRESSION: Trace right apical pneumothorax and at most a trace right pleural effusion with right pleural catheter in place. No evidence of acute airspace disease. Electronically Signed   By: Logan Bores M.D.   On: 09/11/2018 14:35   Dg Chest 2 View  Result Date: 08/14/2018 CLINICAL DATA:  Follow-up right pleural effusion.  COPD. EXAM: CHEST - 2 VIEW COMPARISON:  07/16/2018 chest radiograph. FINDINGS: Right basilar chest tube terminates in the medial upper right pleural space, unchanged. Right internal jugular MediPort terminates in the lower third of the SVC. Stable cardiomediastinal silhouette with normal heart size. No convincing pneumothorax. No significant pleural effusion. Hyperinflated lungs. No pulmonary edema. No acute consolidative airspace disease. Stable minimal bibasilar scarring versus atelectasis. IMPRESSION: 1. Stable right chest tube with no appreciable residual pneumothorax. 2. No significant pleural effusion. 3. Stable minimal bibasilar scarring versus atelectasis. 4. Hyperinflated lungs, compatible with the provided history of COPD. Electronically Signed   By: Ilona Sorrel M.D.   On: 08/14/2018  09:40   Ct Abdomen Pelvis W Contrast  Result Date: 09/12/2018 CLINICAL DATA:  Ovarian cancer restaging EXAM: CT ABDOMEN AND PELVIS WITH CONTRAST TECHNIQUE: Multidetector CT imaging of the abdomen and pelvis was performed using the standard protocol following bolus administration of intravenous contrast. CONTRAST:  141m OMNIPAQUE IOHEXOL 300 MG/ML  SOLN COMPARISON:  Multiple exams, including 07/22/2018 FINDINGS: Lower chest: Small right pleural effusion with indwelling pleural catheter subsegmental atelectasis anteriorly right lower lobe and in the right middle lobe. Right coronary artery atherosclerotic calcification. Hepatobiliary: Cholecystectomy. No liver mass observed. Common bile duct 10 mm in diameter, stable. Pancreas: Unremarkable Spleen: Stable but nonspecific 0.7 by 0.6 cm hypodense lesion medially in the spleen on image 14/2. At least 3 additional smaller hypodensities in the spleen are likewise stable nonspecific. Adrenals/Urinary Tract: Unremarkable Stomach/Bowel: Air-fluid level the rectum compatible with diarrheal process. Scattered normal sized loops of small bowel. Vascular/Lymphatic: Aortoiliac atherosclerotic vascular disease. Reproductive: Prior hysterectomy and bilateral salpingo-oophorectomy. Other: Scattered mild ascites. This is increased from prior. Nodularity along the paracolic gutters and along some of the ascites, for example in the right upper quadrant adjacent to the proximal transverse colon on images 27 through 22 of series 5, compatible with peritoneal tumor. This is felt overall be mildly increased from prior. For example a small tumor deposit along the left adnexa measures 8 mm in thickness on image 66/2, previously 5 mm. Musculoskeletal: Left hip screw. 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 Stable superior endplate compression at L5 with grade 1 degenerative retrolisthesis at L4-5. Lower thoracic lumbar spondylosis. Small lax umbilical hernia containing adipose  tissue and a margin of the colon. IMPRESSION: 1. Mildly progressive ascites with some mild degree of nodularity most appreciable along the left adnexa, right paracolic gutter, right upper quadrant, compatible with peritoneal spread  of tumor. 2. Small right pleural effusion with indwelling right pleural catheter again noted. Mild increase in atelectasis anteriorly at the right lung base. 3.  Aortic Atherosclerosis (ICD10-I70.0).  Coronary atherosclerosis. 4. Several tiny hypodense lesions in the spleen are technically nonspecific, but stable. 5. Air fluid level in the rectum compatible with diarrheal process. 6. Prominent bilateral hip arthropathy.  Left hip screw noted. Electronically Signed   By: Van Clines M.D.   On: 09/12/2018 07:13   US Abdomen Limited  Result Date: 09/05/2018 CLINICAL DATA:  Evaluate for ascites. EXAM: ULTRASOUND ABDOMEN LIMITED COMPARISON:  CT 07/22/2018. FINDINGS: No evidence of mass lesion. Trace amount of free fluid noted in the pelvis. No large volume of ascites noted IMPRESSION: Trace amount of free fluid in the pelvis. No large volume of ascites noted. Electronically Signed   By: Marcello Moores  Register   On: 09/05/2018 10:17    All questions were answered. The patient knows to call the clinic with any problems, questions or concerns. No barriers to learning was detected.  I spent 30 minutes counseling the patient face to face. The total time spent in the appointment was 40 minutes and more than 50% was on counseling and review of test results  Heath Lark, MD 09/12/2018 2:14 PM

## 2018-09-12 NOTE — Assessment & Plan Note (Signed)
She has persistent pleural effusion that could be related to disease She will continue management by cardiothoracic surgery.

## 2018-09-12 NOTE — Assessment & Plan Note (Signed)
The patient is aware she has incurable disease and treatment is strictly palliative. We discussed importance of Advanced Directives and Living will.

## 2018-09-12 NOTE — Assessment & Plan Note (Signed)
Unfortunately, the patient have clinical signs and symptoms of disease progression along with abnormal imaging study We discussed treatment options Treatment is strictly palliative in nature We discussed potential treatment with single agent carboplatin, pemetrexed or combination treatment with carboplatin and gemcitabine and she elected for combination treatment.  We discussed the role of chemotherapy is of palliative intent, in accordance with NCCN guidelines, based on publication below: Int J Gynecol Cancer. 2005 May-Jun;15 Suppl 1:36-41. Combination therapy with gemcitabine and carboplatin in recurrent ovarian cancer. Pfisterer J1, [place] I, [place] A, Eisenhauer E; AGO-OVAR,; NCIC CTG; EORTC GCG.  Participants with recurrent platinum-sensitive ovarian cancer were randomly assigned to receive either gemcitabine-carboplatin or carboplatin every 21 days. The primary objective was to compare progression-free survival (PFS) between the groups. From September 1999 to April 2002, 356 patients (178 participants received gemcitabine-carboplatin, 178 received carboplatin only) were randomized to treatment. Patients received six cycles of either gemcitabine-carboplatin or carboplatin. With a median follow-up of 17 months, median PFS was 8.6 months for gemcitabine-carboplatin (95% confidence interval [CI] 7.9-9.7 months) and 5.8 months for carboplatin (95% CI 5.2-7.1 months; hazard ratio [HR] 0.72 [95% CI 0.58-0.90; P = 0.0032]). The response rate for the gemcitabine-carboplatin group was 47.2% (95% CI 39.9-54.5%) and 30.9% for carboplatin group (95% CI 24.1-37.7%; P = 0.0016). The HR for overall survival was 0.96 (95% CI 0.75-1.23; P = 0.7349). Patients treated with gemcitabine-carboplatin reported significantly faster palliation of abdominal symptoms and a significantly improved global quality of life. Gemcitabine-carboplatin treatment significantly improves the PFS of patients with platinum-sensitive  recurrent ovarian cancer.  We discussed the role of chemotherapy. The intent is of palliative intent.  We discussed some of the risks, benefits, side-effects of carboplatin & gemcitabine  Some of the short term side-effects included, though not limited to, including weight loss, life threatening infections, risk of allergic reactions, need for transfusions of blood products, nausea, vomiting, change in bowel habits, loss of hair, admission to hospital for various reasons, and risks of death.   Long term side-effects are also discussed including risks of infertility, permanent damage to nerve function, hearing loss, chronic fatigue, kidney damage with possibility needing hemodialysis, and rare secondary malignancy including bone marrow disorders.  The patient is aware that the response rates discussed earlier is not guaranteed.  After a long discussion, patient made an informed decision to proceed with the prescribed plan of care.   Patient education material was dispensed Due to her age, other comorbidities, chronic kidney disease, I plan to reduce the dose of gemcitabine drastically at 50% dose She will get carboplatin on day 1 and gemcitabine on day 1 and 8 I will see her on day 8 of treatment

## 2018-09-12 NOTE — Progress Notes (Signed)
DISCONTINUE ON PATHWAY REGIMEN - Ovarian     A cycle is every 21 days:     Paclitaxel      Carboplatin   **Always confirm dose/schedule in your pharmacy ordering system**  REASON: Disease Progression PRIOR TREATMENT: [id number]: Carboplatin AUC=6 + Paclitaxel 175 mg/m2 q21 Days x 2-4 Cycles TREATMENT RESPONSE: Progressive Disease (PD)  START OFF PATHWAY REGIMEN - Ovarian   [id number]:Carboplatin AUC=4 D1 + Gemcitabine 800 mg/m2 D1, 8 q21 Days:   A cycle is every 21 days:     Gemcitabine      Carboplatin   **Always confirm dose/schedule in your pharmacy ordering system**  Patient Characteristics: Recurrent or Progressive Disease, Second Line Therapy, Platinum Sensitive and ? 6 Months Since Last Therapy Therapeutic Status: Recurrent or Progressive Disease BRCA Mutation Status: Absent Line of Therapy: Second Line  Intent of Therapy: Non-Curative / Palliative Intent, Discussed with Patient 

## 2018-09-12 NOTE — Assessment & Plan Note (Signed)
She has significant chronic arthritis pain and cancer pain We will continue chronic pain management along with Cymbalta

## 2018-09-16 ENCOUNTER — Encounter (HOSPITAL_COMMUNITY)
Admission: RE | Admit: 2018-09-16 | Discharge: 2018-09-16 | Disposition: A | Discharge status: Home / Self Care | Payer: Medicare Other | Source: Ambulatory Visit | Attending: Emergency Medicine | Admitting: Emergency Medicine

## 2018-09-16 DIAGNOSIS — J431 Panlobular emphysema: Secondary | ICD-10-CM

## 2018-09-16 NOTE — Progress Notes (Signed)
Daily Session Note  Patient Details  Name: [name] MRN: [id number] Date of Birth: 24-Nov-1932 Referring Provider:     [place] from 08/27/2018 in [place]  Referring Provider  [place]       Encounter Date: 09/16/2018  Check In: Session Check In - 09/16/18 1045      Check-In   Supervising physician immediately available to respond to emergencies  See telemetry face sheet for immediately available MD    Location  [facility]    Staff Present  Russella Dar, MS, EP, [facility], Exercise Physiologist;Katlin Ciszewski Zachery Conch, Exercise Physiologist    Medication changes reported      No    Fall or balance concerns reported     Yes    Comments  Uses a walker    Tobacco Cessation  No Change    Warm-up and Cool-down  Performed as group-led instruction    Resistance Training Performed  Yes    VAD Patient?  No    PAD/SET Patient?  No      Pain Assessment   Currently in Pain?  No/denies    Pain Score  0-No pain    Multiple Pain Sites  No       Capillary Blood Glucose: No results found for this or any previous visit (from the past 24 hour(s)).    Social History   Tobacco Use  Smoking Status Former Smoker  . Packs/day: 1.00  . Years: 20.00  . Pack years: 20.00  . Types: Cigarettes  . Last attempt to quit: 10/08/1980  . Years since quitting: 37.9  Smokeless Tobacco Never Used    Goals Met:  Proper associated with RPD/PD & O2 Sat Independence with exercise equipment Using PLB without cueing & demonstrates good technique Exercise tolerated well No report of cardiac concerns or symptoms Strength training completed today  Goals Unmet:  Not Applicable  Comments: Pt able to follow exercise prescription today without complaint.  Will continue to monitor for progression. Check out 1145.   Dr. Sinda Du is Medical Director for [facility].

## 2018-09-18 ENCOUNTER — Encounter (HOSPITAL_COMMUNITY): Admission: RE | Admit: 2018-09-18 | Payer: Medicare Other | Source: Ambulatory Visit

## 2018-09-18 ENCOUNTER — Other Ambulatory Visit: Payer: Self-pay | Admitting: Hematology and Oncology

## 2018-09-22 ENCOUNTER — Inpatient Hospital Stay: Payer: Medicare Other

## 2018-09-22 ENCOUNTER — Inpatient Hospital Stay (HOSPITAL_BASED_OUTPATIENT_CLINIC_OR_DEPARTMENT_OTHER): Payer: Medicare Other | Admitting: Hematology and Oncology

## 2018-09-22 ENCOUNTER — Other Ambulatory Visit: Payer: Self-pay | Admitting: Hematology and Oncology

## 2018-09-22 ENCOUNTER — Encounter: Payer: Self-pay | Admitting: Hematology and Oncology

## 2018-09-22 DIAGNOSIS — C562 Malignant neoplasm of left ovary: Secondary | ICD-10-CM

## 2018-09-22 DIAGNOSIS — G893 Neoplasm related pain (acute) (chronic): Secondary | ICD-10-CM

## 2018-09-22 DIAGNOSIS — M129 Arthropathy, unspecified: Secondary | ICD-10-CM

## 2018-09-22 DIAGNOSIS — R188 Other ascites: Secondary | ICD-10-CM

## 2018-09-22 DIAGNOSIS — Z90722 Acquired absence of ovaries, bilateral: Secondary | ICD-10-CM

## 2018-09-22 DIAGNOSIS — J90 Pleural effusion, not elsewhere classified: Secondary | ICD-10-CM | POA: Diagnosis not present

## 2018-09-22 DIAGNOSIS — D638 Anemia in other chronic diseases classified elsewhere: Secondary | ICD-10-CM

## 2018-09-22 DIAGNOSIS — Z79811 Long term (current) use of aromatase inhibitors: Secondary | ICD-10-CM | POA: Diagnosis not present

## 2018-09-22 DIAGNOSIS — Z90710 Acquired absence of both cervix and uterus: Secondary | ICD-10-CM

## 2018-09-22 DIAGNOSIS — J449 Chronic obstructive pulmonary disease, unspecified: Secondary | ICD-10-CM

## 2018-09-22 DIAGNOSIS — I700 Atherosclerosis of aorta: Secondary | ICD-10-CM

## 2018-09-22 DIAGNOSIS — Z7982 Long term (current) use of aspirin: Secondary | ICD-10-CM

## 2018-09-22 DIAGNOSIS — R971 Elevated cancer antigen 125 [CA 125]: Secondary | ICD-10-CM

## 2018-09-22 DIAGNOSIS — Z79899 Other long term (current) drug therapy: Secondary | ICD-10-CM

## 2018-09-22 DIAGNOSIS — N189 Chronic kidney disease, unspecified: Secondary | ICD-10-CM

## 2018-09-22 DIAGNOSIS — I2510 Atherosclerotic heart disease of native coronary artery without angina pectoris: Secondary | ICD-10-CM

## 2018-09-22 DIAGNOSIS — Z5111 Encounter for antineoplastic chemotherapy: Secondary | ICD-10-CM | POA: Diagnosis not present

## 2018-09-22 LAB — COMPREHENSIVE METABOLIC PANEL
ALT: 9 U/L (ref 0–44)
AST: 15 U/L (ref 15–41)
Albumin: 3.2 g/dL — ABNORMAL LOW (ref 3.5–5.0)
Alkaline Phosphatase: 58 U/L (ref 38–126)
Anion gap: 9 (ref 5–15)
BILIRUBIN TOTAL: 0.3 mg/dL (ref 0.3–1.2)
BUN: 18 mg/dL (ref 8–23)
CO2: 29 mmol/L (ref 22–32)
Calcium: 9.2 mg/dL (ref 8.9–10.3)
Chloride: 97 mmol/L — ABNORMAL LOW (ref 98–111)
Creatinine, Ser: 1.09 mg/dL — ABNORMAL HIGH (ref 0.44–1.00)
GFR calc Af Amer: 54 mL/min — ABNORMAL LOW (ref >60)
GFR calc non Af Amer: 47 mL/min — ABNORMAL LOW (ref >60)
Glucose, Bld: 124 mg/dL — ABNORMAL HIGH (ref 70–99)
POTASSIUM: 3.3 mmol/L — AB (ref 3.5–5.1)
Sodium: 135 mmol/L (ref 135–145)
TOTAL PROTEIN: 7.1 g/dL (ref 6.5–8.1)

## 2018-09-22 LAB — CBC WITH DIFFERENTIAL/PLATELET
Abs Immature Granulocytes: 0.06 10*3/uL (ref 0.00–0.07)
Basophils Absolute: 0 10*3/uL (ref 0.0–0.1)
Basophils Relative: 0 %
Eosinophils Absolute: 0.3 10*3/uL (ref 0.0–0.5)
Eosinophils Relative: 4 %
HCT: 28 % — ABNORMAL LOW (ref 36.0–46.0)
HEMOGLOBIN: 8.8 g/dL — AB (ref 12.0–15.0)
IMMATURE GRANULOCYTES: 1 %
Lymphocytes Relative: 12 %
Lymphs Abs: 1.1 10*3/uL (ref 0.7–4.0)
MCH: 25.8 pg — ABNORMAL LOW (ref 26.0–34.0)
MCHC: 31.4 g/dL (ref 30.0–36.0)
MCV: 82.1 fL (ref 80.0–100.0)
Monocytes Absolute: 0.9 10*3/uL (ref 0.1–1.0)
Monocytes Relative: 10 %
NEUTROS PCT: 73 %
Neutro Abs: 6.6 10*3/uL (ref 1.7–7.7)
Platelets: 255 10*3/uL (ref 150–400)
RBC: 3.41 MIL/uL — ABNORMAL LOW (ref 3.87–5.11)
RDW: 13.5 % (ref 11.5–15.5)
WBC: 8.9 10*3/uL (ref 4.0–10.5)
nRBC: 0 % (ref 0.0–0.2)

## 2018-09-22 LAB — MAGNESIUM: Magnesium: 1.3 mg/dL — CL (ref 1.7–2.4)

## 2018-09-22 MED ORDER — SODIUM CHLORIDE 0.9 % IV SOLN
268.8000 mg | Freq: Once | INTRAVENOUS | Status: AC
  Administered 2018-09-22: 270 mg via INTRAVENOUS
  Filled 2018-09-22: qty 27

## 2018-09-22 MED ORDER — FAMOTIDINE IN NACL 20-0.9 MG/50ML-% IV SOLN
INTRAVENOUS | Status: AC
  Filled 2018-09-22: qty 50

## 2018-09-22 MED ORDER — SODIUM CHLORIDE 0.9 % IV SOLN
500.0000 mg/m2 | Freq: Once | INTRAVENOUS | Status: AC
  Administered 2018-09-22: 950 mg via INTRAVENOUS
  Filled 2018-09-22: qty 24.98

## 2018-09-22 MED ORDER — PALONOSETRON HCL INJECTION 0.25 MG/5ML
0.2500 mg | Freq: Once | INTRAVENOUS | Status: AC
  Administered 2018-09-22: 0.25 mg via INTRAVENOUS

## 2018-09-22 MED ORDER — FAMOTIDINE IN NACL 20-0.9 MG/50ML-% IV SOLN
20.0000 mg | Freq: Once | INTRAVENOUS | Status: AC
  Administered 2018-09-22: 20 mg via INTRAVENOUS

## 2018-09-22 MED ORDER — SODIUM CHLORIDE 0.9% FLUSH
10.0000 mL | INTRAVENOUS | Status: DC | PRN
  Administered 2018-09-22: 10 mL
  Filled 2018-09-22: qty 10

## 2018-09-22 MED ORDER — DIPHENHYDRAMINE HCL 25 MG PO TABS
25.0000 mg | ORAL_TABLET | Freq: Once | ORAL | Status: AC
  Administered 2018-09-22: 25 mg via ORAL
  Filled 2018-09-22: qty 1

## 2018-09-22 MED ORDER — PALONOSETRON HCL INJECTION 0.25 MG/5ML
INTRAVENOUS | Status: AC
  Filled 2018-09-22: qty 5

## 2018-09-22 MED ORDER — SODIUM CHLORIDE 0.9 % IV SOLN
Freq: Once | INTRAVENOUS | Status: AC
  Administered 2018-09-22: 15:00:00 via INTRAVENOUS
  Filled 2018-09-22: qty 5

## 2018-09-22 MED ORDER — DIPHENHYDRAMINE HCL 25 MG PO CAPS
ORAL_CAPSULE | ORAL | Status: AC
  Filled 2018-09-22: qty 1

## 2018-09-22 MED ORDER — SODIUM CHLORIDE 0.9% FLUSH
10.0000 mL | Freq: Once | INTRAVENOUS | Status: AC
  Administered 2018-09-22: 10 mL
  Filled 2018-09-22: qty 10

## 2018-09-22 MED ORDER — SODIUM CHLORIDE 0.9 % IV SOLN
Freq: Once | INTRAVENOUS | Status: AC
  Administered 2018-09-22: 14:00:00 via INTRAVENOUS
  Filled 2018-09-22: qty 250

## 2018-09-22 MED ORDER — HEPARIN SOD (PORK) LOCK FLUSH 100 UNIT/ML IV SOLN
500.0000 [IU] | Freq: Once | INTRAVENOUS | Status: AC | PRN
  Administered 2018-09-22: 500 [IU]
  Filled 2018-09-22: qty 5

## 2018-09-22 NOTE — Progress Notes (Signed)
Pulmonary Individual Treatment Plan  Patient Details  Name: [name] MRN: [id number] Date of Birth: 11/19/1932 Referring Provider:     [place] from 08/27/2018 in [place]  Referring Provider  [place]rum       Initial Encounter Date:    CARDIAC REHAB PHASE II ORIENTATION from 08/27/2018 in [place]  Date  08/27/18      Visit Diagnosis: Panlobular emphysema ([place])  Patient's Home Medications on Admission:   Current Outpatient Medications:  .  albuterol (PROAIR HFA) 108 (90 Base) MCG/ACT inhaler, Inhale 2 puffs into the lungs every 6 (six) hours as needed for wheezing or shortness of breath., Disp: 1 Inhaler, Rfl: 3 .  ALPRAZolam (XANAX) 0.25 MG tablet, Take 1 tablet (0.25 mg total) by mouth at bedtime as needed for anxiety., Disp: 5 tablet, Rfl: 0 .  amoxicillin (AMOXIL) 500 MG capsule, TAKE 4 CAPSULES PO 1 HOUR PRIOR TO DENTAL APPOINTMENT, Disp: , Rfl: 1 .  azelastine (ASTELIN) 0.1 % nasal spray, Place 2 sprays into both nostrils 2 (two) times daily as needed for allergies. , Disp: , Rfl:  .  Cholecalciferol (VITAMIN D) 2000 units CAPS, Take 2,000 Units by mouth daily. , Disp: , Rfl:  .  dicyclomine (BENTYL) 10 MG capsule, Take 1 tab by mouth every morning. May take twice daily as needed. (Patient taking differently: Take 10 mg by mouth 2 (two) times daily. ), Disp: 60 capsule, Rfl: 6 .  diltiazem (DILACOR XR) 180 MG 24 hr capsule, Take 180 mg by mouth every morning., Disp: , Rfl:  .  diphenhydrAMINE (BENADRYL) 25 mg capsule, Take 25 mg by mouth daily as needed for itching. , Disp: , Rfl:  .  diphenoxylate-atropine (LOMOTIL) 2.5-0.025 MG tablet, Take 1 tablet by mouth 4 (four) times daily. (Patient taking differently: Take 1 tablet by mouth 4 (four) times daily as needed for diarrhea or loose stools. ), Disp: 360 tablet, Rfl: 1 .  DULoxetine (CYMBALTA) 60 MG capsule, Take 30 mg by mouth daily. , Disp: , Rfl:   .  fluticasone (FLONASE) 50 MCG/ACT nasal spray, Place 2 sprays into both nostrils 2 (two) times daily as needed (FOR NASAL CONGESTION.). , Disp: , Rfl:  .  furosemide (LASIX) 20 MG tablet, Take 2 tablets (40 mg total) by mouth daily., Disp: 180 tablet, Rfl: 3 .  halobetasol (ULTRAVATE) 0.05 % cream, Apply 1 application topically 2 (two) times daily as needed (psoriasis). , Disp: , Rfl:  .  levothyroxine (SYNTHROID, LEVOTHROID) 137 MCG tablet, Take 137 mcg by mouth daily before breakfast. For thyroid therapy, Disp: , Rfl:  .  Magnesium 400 MG CAPS, Take 400 mg by mouth daily., Disp: , Rfl:  .  mirtazapine (REMERON) 15 MG tablet, TAKE 1 TABLET(15 MG) BY MOUTH AT BEDTIME (Patient taking differently: Take 15 mg by mouth at bedtime. ), Disp: 30 tablet, Rfl: 11 .  Oxycodone HCl 10 MG TABS, Take 1 tablet (10 mg total) by mouth every 6 (six) hours as needed., Disp: 60 tablet, Rfl: 0 .  polyvinyl alcohol (LIQUIFILM TEARS) 1.4 % ophthalmic solution, Place 1 drop into both eyes 2 (two) times daily as needed for dry eyes. , Disp: , Rfl:  .  potassium chloride SA (K-DUR,KLOR-CON) 20 MEQ tablet, Take 10 mEq by mouth daily., Disp: , Rfl:  .  rivaroxaban (XARELTO) 20 MG TABS tablet, Take 20 mg by mouth daily with supper., Disp: , Rfl:  .  STIOLTO RESPIMAT 2.5-2.5  MCG/ACT AERS, INHALE 2 PUFFS INTO THE LUNGS DAILY, Disp: 4 g, Rfl: 5 .  Ustekinumab (STELARA [place]), Inject 1 Dose into the skin every 3 (three) months., Disp: , Rfl:   Past Medical History: Past Medical History:  Diagnosis Date  . Anxiety   . Chronic blood loss anemia    03-04-2018 diverticular bleed and rectal bleeding,  transfused 2 units PRBCs 03-08-2018  . Colitis   . COPD (chronic obstructive pulmonary disease) (HCC)    Dr. Lamonte Sakai  . Depression   . Diastolic CHF, chronic ([place])   . Diverticulosis   . Family history of colon cancer   . Fibromyalgia   . Genetic testing 04/07/2018   MyRisk (35 genes) @ [facility] - No pathogenic mutations detected   . GERD (gastroesophageal reflux disease)   . Hemorrhoids   . Hiatal hernia   . Hip pain 07/2018   Right Hip Pain  . History of rectal polyps   . History of shingles   . Hypothyroidism   . IBS (irritable bowel syndrome)   . Lymphocytic colitis    Dr. Henrene Pastor  . Malignant ascites    Admission 06/2017 abdominal s/p parencentesis 07-01-2017 2.5L, 07-08-2016  2.7L, 07-12-2017  1450m  . [place] chemotherapeutic drug ([place]   . Osteoporosis   . Ovarian cancer ([facility]    Chemotherapy - Dr. GAlvy Bimler . Paroxysmal atrial fibrillation ([place]    Xarelto stopped 03-07-2018 due to lower GI bleed  . Pleural effusion    s/p  right thoracentesis, 02-2018 1.3L and 03-17-2018 right thoracentesis 6435m, post cxr no residual effusion  . Psoriatic arthritis ([place]  . Schatzki's ring 2013  . Seasonal allergic rhinitis     Tobacco Use: Social History   Tobacco Use  Smoking Status Former Smoker  . Packs/day: 1.00  . Years: 20.00  . Pack years: 20.00  . Types: Cigarettes  . Last attempt to quit: 10/08/1980  . Years since quitting: 37.9  Smokeless Tobacco Never Used    Labs: Recent ReChemical engineer  Labs for ITP Cardiac and Pulmonary Rehab Latest Ref Rng & Units 06/12/2013 11/14/2017   Cholestrol 0 - 200 mg/dL - 147   Hemoglobin A1c <5.7 % 5.7(H) -      Capillary Blood Glucose: Lab Results  Component Value Date   GLUCAP 102 (H) 03/07/2018   GLUCAP 100 (H) 03/07/2018   GLUCAP 111 (H) 03/06/2018   GLUCAP 114 (H) 03/05/2018   GLUCAP 100 (H) 03/05/2018     Pulmonary Assessment Scores: Pulmonary Assessment Scores    Row Name 08/27/18 1058         ADL UCSD   ADL Phase  Entry     SOB Score total  73     Rest  1     Walk  12     Stairs  5     Bath  3     Dress  4     Shop  5       CAT Score   CAT Score  22       mMRC Score   mMRC Score  3        Pulmonary Function Assessment: Pulmonary Function Assessment - 08/27/18 1047      Pulmonary Function Tests   FVC%   1.77 %    FEV1%  1.84 %    FEV1/FVC Ratio  73      Initial Spirometry Results   FVC%  1.77 %  FEV1%  1.84 %    FEV1/FVC Ratio  73      Post Bronchodilator Spirometry Results   FVC%  72 %    FEV1%  52 %    FEV1/FVC Ratio  54      Breath   Bilateral Breath Sounds  Clear    Shortness of Breath  Yes;Limiting activity       Exercise Target Goals: Exercise Program Goal: Individual exercise prescription set using results from initial 6 min walk test and THRR while considering  patient's activity barriers and safety.   Exercise Prescription Goal: Initial exercise prescription builds to 30-45 minutes a day of aerobic activity, 2-3 days per week.  Home exercise guidelines will be given to patient during program as part of exercise prescription that the participant will acknowledge.  Activity Barriers & Risk Stratification: Activity Barriers & Cardiac Risk Stratification - 08/27/18 1040      Activity Barriers & Cardiac Risk Stratification   Activity Barriers  Right Knee Replacement;Shortness of Breath;Muscular Weakness;Other (comment)    Comments  L hip pain     Cardiac Risk Stratification  Moderate       6 Minute Walk: 6 Minute Walk    Row Name 08/27/18 1038         6 Minute Walk   Phase  Initial     Distance  600 feet     Walk Time  5 minutes     # of Rest Breaks  1 got tired      MPH  1.14     METS  1.87     RPE  13     Perceived Dyspnea   13     VO2 Peak  4.81     Symptoms  Yes (comment)     Comments  5/10 bilateral buttocks pain      Resting HR  92 bpm     Resting BP  120/50     Resting Oxygen Saturation   95 %     Exercise Oxygen Saturation  during 6 min walk  94 %     Max Ex. HR  135 bpm     Max Ex. BP  154/52     2 Minute Post BP  130/54        Oxygen Initial Assessment: Oxygen Initial Assessment - 08/27/18 1046      Home Oxygen   Home Oxygen Device  None    Sleep Oxygen Prescription  None    Home Exercise Oxygen Prescription  None    Home at  Rest Exercise Oxygen Prescription  None      Initial 6 min Walk   Oxygen Used  None      Program Oxygen Prescription   Program Oxygen Prescription  None       Oxygen Re-Evaluation: Oxygen Re-Evaluation    Row Name 09/10/18 1417             Program Oxygen Prescription   Program Oxygen Prescription  None         Home Oxygen   Home Oxygen Device  None       Sleep Oxygen Prescription  None       Home Exercise Oxygen Prescription  None       Home at Rest Exercise Oxygen Prescription  None       Compliance with Home Oxygen Use  - N/A         Goals/Expected Outcomes   Short Term Goals  To  learn and understand importance of monitoring SPO2 with pulse oximeter and demonstrate accurate use of the pulse oximeter.;To learn and understand importance of maintaining oxygen saturations>88%;To learn and demonstrate proper pursed lip breathing techniques or other breathing techniques.       Long  Term Goals  Verbalizes importance of monitoring SPO2 with pulse oximeter and return demonstration;Maintenance of O2 saturations>88%;Exhibits proper breathing techniques, such as pursed lip breathing or other method taught during program session       Comments  Patient is able to demonstrate proper usage of pulse oximeter with return demonstration and proper pursed lip breathing. She is able to verbalize the importance of maintaining her O2 saturation >88%. Will continue to monitor.        Goals/Expected Outcomes  Patient will continue to meet her short and long goals.           Oxygen Discharge (Final Oxygen Re-Evaluation): Oxygen Re-Evaluation - 09/10/18 1417      Program Oxygen Prescription   Program Oxygen Prescription  None      Home Oxygen   Home Oxygen Device  None    Sleep Oxygen Prescription  None    Home Exercise Oxygen Prescription  None    Home at Rest Exercise Oxygen Prescription  None    Compliance with Home Oxygen Use  --   N/A     Goals/Expected Outcomes   Short Term Goals   To learn and understand importance of monitoring SPO2 with pulse oximeter and demonstrate accurate use of the pulse oximeter.;To learn and understand importance of maintaining oxygen saturations>88%;To learn and demonstrate proper pursed lip breathing techniques or other breathing techniques.    Long  Term Goals  Verbalizes importance of monitoring SPO2 with pulse oximeter and return demonstration;Maintenance of O2 saturations>88%;Exhibits proper breathing techniques, such as pursed lip breathing or other method taught during program session    Comments  Patient is able to demonstrate proper usage of pulse oximeter with return demonstration and proper pursed lip breathing. She is able to verbalize the importance of maintaining her O2 saturation >88%. Will continue to monitor.     Goals/Expected Outcomes  Patient will continue to meet her short and long goals.        Initial Exercise Prescription: Initial Exercise Prescription - 08/27/18 1000      Date of Initial Exercise RX and Referring Provider   Date  08/27/18    Referring Provider  Byrum     Expected Discharge Date  11/27/18      Treadmill   MPH  0.8    Grade  0    Minutes  17    METs  1.61      NuStep   Level  1    SPM  48    Minutes  17    METs  1.9      Prescription Details   Frequency (times per week)  2    Duration  Progress to 30 minutes of continuous aerobic without signs/symptoms of physical distress      Intensity   THRR 40-80% of Max Heartrate  110-119-128    Ratings of Perceived Exertion  11-13    Perceived Dyspnea  0-4      Progression   Progression  Continue to progress workloads to maintain intensity without signs/symptoms of physical distress.      Resistance Training   Training Prescription  Yes    Weight  1    Reps  10-15  Perform Capillary Blood Glucose checks as needed.  Exercise Prescription Changes:  Exercise Prescription Changes    Row Name 09/11/18 0800             Response to  Exercise   Blood Pressure (Admit)  138/60       Blood Pressure (Exercise)  140/58       Blood Pressure (Exit)  110/52       Heart Rate (Admit)  109 bpm       Heart Rate (Exercise)  124 bpm       Heart Rate (Exit)  106 bpm       Oxygen Saturation (Admit)  97 %       Oxygen Saturation (Exercise)  96 %       Oxygen Saturation (Exit)  97 %       Rating of Perceived Exertion (Exercise)  11       Perceived Dyspnea (Exercise)  11       Comments  First two weeks of exercise sessions        Duration  Progress to 30 minutes of  aerobic without signs/symptoms of physical distress       Intensity  THRR New 110-119-128         Progression   Progression  Continue to progress workloads to maintain intensity without signs/symptoms of physical distress.       Average METs  1.6         Resistance Training   Training Prescription  Yes       Weight  1       Reps  10-15         Treadmill   MPH  0.8       Grade  0       Minutes  17       METs  1.61         NuStep   Level  1       SPM  48       Minutes  22       METs  1.6          Exercise Comments:  Exercise Comments    Row Name 09/02/18 1450           Exercise Comments  Patient has attended one full exercise session so far. She tolerated exercise well. We will monitor for progress.           Exercise Goals and Review:  Exercise Goals    Row Name 08/27/18 1042             Exercise Goals   Increase Physical Activity  Yes       Intervention  Provide advice, education, support and counseling about physical activity/exercise needs.;Develop an individualized exercise prescription for aerobic and resistive training based on initial evaluation findings, risk stratification, comorbidities and participant's personal goals.       Expected Outcomes  Short Term: Attend rehab on a regular basis to increase amount of physical activity.       Increase Strength and Stamina  Yes       Intervention  Provide advice, education, support and  counseling about physical activity/exercise needs.;Develop an individualized exercise prescription for aerobic and resistive training based on initial evaluation findings, risk stratification, comorbidities and participant's personal goals.       Expected Outcomes  Short Term: Increase workloads from initial exercise prescription for resistance, speed, and METs.       Able  to understand and use rate of perceived exertion (RPE) scale  Yes       Intervention  Provide education and explanation on how to use RPE scale       Expected Outcomes  Short Term: Able to use RPE daily in rehab to express subjective intensity level;Long Term:  Able to use RPE to guide intensity level when exercising independently       Able to understand and use Dyspnea scale  Yes       Intervention  Provide education and explanation on how to use Dyspnea scale       Expected Outcomes  Short Term: Able to use Dyspnea scale daily in rehab to express subjective sense of shortness of breath during exertion;Long Term: Able to use Dyspnea scale to guide intensity level when exercising independently       Knowledge and understanding of Target Heart Rate Range (THRR)  Yes       Intervention  Provide education and explanation of THRR including how the numbers were predicted and where they are located for reference       Expected Outcomes  Short Term: Able to state/look up THRR;Long Term: Able to use THRR to govern intensity when exercising independently;Short Term: Able to use daily as guideline for intensity in rehab       Able to check pulse independently  Yes       Intervention  Provide education and demonstration on how to check pulse in carotid and radial arteries.;Review the importance of being able to check your own pulse for safety during independent exercise       Expected Outcomes  Short Term: Able to explain why pulse checking is important during independent exercise;Long Term: Able to check pulse independently and accurately        Understanding of Exercise Prescription  Yes       Intervention  Provide education, explanation, and written materials on patient's individual exercise prescription       Expected Outcomes  Short Term: Able to explain program exercise prescription;Long Term: Able to explain home exercise prescription to exercise independently          Exercise Goals Re-Evaluation : Exercise Goals Re-Evaluation    Row Name 09/02/18 1449             Exercise Goal Re-Evaluation   Exercise Goals Review  Increase Physical Activity;Increase Strength and Stamina;Able to understand and use rate of perceived exertion (RPE) scale;Understanding of Exercise Prescription;Knowledge and understanding of Target Heart Rate Range (THRR);Able to understand and use Dyspnea scale;Able to check pulse independently       Comments  Patient just started pulmonary rehab. She has attended 1 exercise session so far. We will monitor progress.       Expected Outcomes  Increase strength to do more on her own.           Discharge Exercise Prescription (Final Exercise Prescription Changes): Exercise Prescription Changes - 09/11/18 0800      Response to Exercise   Blood Pressure (Admit)  138/60    Blood Pressure (Exercise)  140/58    Blood Pressure (Exit)  110/52    Heart Rate (Admit)  109 bpm    Heart Rate (Exercise)  124 bpm    Heart Rate (Exit)  106 bpm    Oxygen Saturation (Admit)  97 %    Oxygen Saturation (Exercise)  96 %    Oxygen Saturation (Exit)  97 %    Rating of Perceived Exertion (  Exercise)  11    Perceived Dyspnea (Exercise)  11    Comments  First two weeks of exercise sessions     Duration  Progress to 30 minutes of  aerobic without signs/symptoms of physical distress    Intensity  THRR New   110-119-128     Progression   Progression  Continue to progress workloads to maintain intensity without signs/symptoms of physical distress.    Average METs  1.6      Resistance Training   Training Prescription  Yes     Weight  1    Reps  10-15      Treadmill   MPH  0.8    Grade  0    Minutes  17    METs  1.61      NuStep   Level  1    SPM  48    Minutes  22    METs  1.6       Nutrition:  Target Goals: Understanding of nutrition guidelines, daily intake of sodium <1523m, cholesterol <2055m calories 30% from fat and 7% or less from saturated fats, daily to have 5 or more servings of fruits and vegetables.  Biometrics: Pre Biometrics - 08/27/18 1043      Pre Biometrics   Height  5' 4"  (1.626 m)    Weight  78.2 kg    Waist Circumference  41.5 inches    Hip Circumference  44 inches    Waist to Hip Ratio  0.94 %    BMI (Calculated)  29.58    Triceps Skinfold  16 mm    % Body Fat  40.5 %    Grip Strength  17.2 kg    Flexibility  0 in    Single Leg Stand  13 seconds        Nutrition Therapy Plan and Nutrition Goals: Nutrition Therapy & Goals - 09/10/18 1419      Nutrition Therapy   RD appointment deferred  Yes      Personal Nutrition Goals   Comments  Patient continues to eat a low sodium and heart healthy diet. She is encourage to meet with RD.      Intervention Plan   Intervention  Nutrition handout(s) given to patient.       Nutrition Assessments: Nutrition Assessments - 08/27/18 1102      MEDFICTS Scores   Pre Score  33       Nutrition Goals Re-Evaluation:   Nutrition Goals Discharge (Final Nutrition Goals Re-Evaluation):   Psychosocial: Target Goals: Acknowledge presence or absence of significant depression and/or stress, maximize coping skills, provide positive support system. Participant is able to verbalize types and ability to use techniques and skills needed for reducing stress and depression.  Initial Review & Psychosocial Screening: Initial Psych Review & Screening - 08/27/18 1100      Initial Review   Current issues with  Current Depression   Just a little depressed about her health.     Family Dynamics   Good Support System?  Yes       Barriers   Psychosocial barriers to participate in program  The patient should benefit from training in stress management and relaxation.;Psychosocial barriers identified (see note)   Feeling depressed due to her health     Screening Interventions   Interventions  Encouraged to exercise    Expected Outcomes  Short Term goal: Identification and review with participant of any Quality of Life or Depression concerns found by scoring  the questionnaire.;Long Term goal: The participant improves quality of Life and PHQ9 Scores as seen by post scores and/or verbalization of changes       Quality of Life Scores: Quality of Life - 08/27/18 1044      Quality of Life   Select  Quality of Life      Quality of Life Scores   Health/Function Pre  14.8 %    Socioeconomic Pre  22.8 %    Psych/Spiritual Pre  23.14 %    Family Pre  30 %    GLOBAL Pre  20.25 %      Scores of 19 and below usually indicate a poorer quality of life in these areas.  A difference of  2-3 points is a clinically meaningful difference.  A difference of 2-3 points in the total score of the Quality of Life Index has been associated with significant improvement in overall quality of life, self-image, physical symptoms, and general health in studies assessing change in quality of life.   PHQ-9: Recent Review Flowsheet Data    Depression screen [facility] 2/9 08/27/2018 12/18/2017   Decreased Interest 0 1   Down, Depressed, Hopeless 0 3   PHQ - 2 Score 0 4   Altered sleeping 1 3   Tired, decreased energy 2 3   Change in appetite 1 0   Feeling bad or failure about yourself  2 0   Trouble concentrating 1 1   Moving slowly or fidgety/restless 0 0   Suicidal thoughts - 0   PHQ-9 Score 7 11   Difficult doing work/chores Somewhat difficult Somewhat difficult     Interpretation of Total Score  Total Score Depression Severity:  1-4 = Minimal depression, 5-9 = Mild depression, 10-14 = Moderate depression, 15-19 = Moderately severe  depression, 20-27 = Severe depression   Psychosocial Evaluation and Intervention: Psychosocial Evaluation - 08/27/18 1101      Psychosocial Evaluation & Interventions   Interventions  Encouraged to exercise with the program and follow exercise prescription;Stress management education    Continue Psychosocial Services   Follow up required by staff       Psychosocial Re-Evaluation: Psychosocial Re-Evaluation    [place] Name 09/10/18 1421             Psychosocial Re-Evaluation   Current issues with  Current Depression       Comments  Patient initial QOL score was 20.25 and her PHQ-9 score was 7. She is currently being treated with Cymbalta and Alprazolam. Will continue to monitor for progress.        Expected Outcomes  Patient's exit QOL and PHQ-9 scores will improve and she will have no addtional psychosocial issues identified at discharge.        Interventions  Stress management education;Encouraged to attend Cardiac Rehabilitation for the exercise;Relaxation education       Continue Psychosocial Services   Follow up required by staff          Psychosocial Discharge (Final Psychosocial Re-Evaluation): Psychosocial Re-Evaluation - 09/10/18 1421      Psychosocial Re-Evaluation   Current issues with  Current Depression    Comments  Patient initial QOL score was 20.25 and her PHQ-9 score was 7. She is currently being treated with Cymbalta and Alprazolam. Will continue to monitor for progress.     Expected Outcomes  Patient's exit QOL and PHQ-9 scores will improve and she will have no addtional psychosocial issues identified at discharge.     Interventions  Stress  management education;Encouraged to attend Cardiac Rehabilitation for the exercise;Relaxation education    Continue Psychosocial Services   Follow up required by staff        Education: Education Goals: Education classes will be provided on a weekly basis, covering required topics. Participant will state understanding/return  demonstration of topics presented.  Learning Barriers/Preferences: Learning Barriers/Preferences - 08/27/18 1005      Learning Barriers/Preferences   Learning Barriers  None    Learning Preferences  Individual Instruction;Group Instruction;Verbal Instruction;Skilled Demonstration       Education Topics: How Lungs Work and Diseases: - Discuss the anatomy of the lungs and diseases that can affect the lungs, such as COPD.   Exercise: -Discuss the importance of exercise, FITT principles of exercise, normal and abnormal responses to exercise, and how to exercise safely.   Environmental Irritants: -Discuss types of environmental irritants and how to limit exposure to environmental irritants.   Meds/Inhalers and oxygen: - Discuss respiratory medications, definition of an inhaler and oxygen, and the proper way to use an inhaler and oxygen.   PULMONARY REHAB OTHER RESPIRATORY from 08/28/2018 in [place]  Date  08/28/18  Educator  Wynetta Emery      Energy Saving Techniques: - Discuss methods to conserve energy and decrease shortness of breath when performing activities of daily living.    Bronchial Hygiene / Breathing Techniques: - Discuss breathing mechanics, pursed-lip breathing technique,  proper posture, effective ways to clear airways, and other functional breathing techniques   Cleaning Equipment: - Provides group verbal and written instruction about the health risks of elevated stress, cause of high stress, and healthy ways to reduce stress.   Nutrition I: Fats: - Discuss the types of cholesterol, what cholesterol does to the body, and how cholesterol levels can be controlled.   Nutrition II: Labels: -Discuss the different components of food labels and how to read food labels.   Respiratory Infections: - Discuss the signs and symptoms of respiratory infections, ways to prevent respiratory infections, and the importance of seeking medical treatment  when having a respiratory infection.   Stress I: Signs and Symptoms: - Discuss the causes of stress, how stress may lead to anxiety and depression, and ways to limit stress.   Stress II: Relaxation: -Discuss relaxation techniques to limit stress.   Oxygen for Home/Travel: - Discuss how to prepare for travel when on oxygen and proper ways to transport and store oxygen to ensure safety.   Knowledge Questionnaire Score: Knowledge Questionnaire Score - 08/27/18 1006      Knowledge Questionnaire Score   Pre Score  16/18       Core Components/Risk Factors/Patient Goals at Admission: Personal Goals and Risk Factors at Admission - 08/27/18 1102      Core Components/Risk Factors/Patient Goals on Admission    Weight Management  Weight Maintenance    Personal Goal Other  Yes    Personal Goal  Return to shopping, become more independent and be able to go places by herself.     Intervention  Attend program 2 x week and supplement with at home exercise 3 x week.     Expected Outcomes  Reach personal goals.        Core Components/Risk Factors/Patient Goals Review:  Goals and Risk Factor Review    Row Name 09/10/18 1419             Core Components/Risk Factors/Patient Goals Review   Personal Goals Review  Weight Management/Obesity;Improve shortness of breath with ADL's Be more  independent; do her own shopping; do things by herself.        Review  Patient is new to the program. She has completed 3 sessions. Will continue to monitor for progress.        Expected Outcomes  Patient will continue to attend sessions and complete the program meeting her personal goals.           Core Components/Risk Factors/Patient Goals at Discharge (Final Review):  Goals and Risk Factor Review - 09/10/18 1419      Core Components/Risk Factors/Patient Goals Review   Personal Goals Review  Weight Management/Obesity;Improve shortness of breath with ADL's   Be more independent; do her own shopping; do  things by herself.    Review  Patient is new to the program. She has completed 3 sessions. Will continue to monitor for progress.     Expected Outcomes  Patient will continue to attend sessions and complete the program meeting her personal goals.        ITP Comments: ITP Comments    Row Name 08/27/18 1043 09/22/18 1335         ITP Comments  Ms. Duch is a pleasant 84 year female who has been through physical therapy to get stronger for pulmonary rehab.   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 She is supposed to start Chemotherapy for treatment soon. MD will be notified.          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 She is supposed to start Chemotherapy for treatment soon. MD will be notified

## 2018-09-22 NOTE — Assessment & Plan Note (Signed)
She has chronic hypomagnesemia likely due to diuretic therapy and chronic diarrhea I recommend magnesium replacement therapy

## 2018-09-22 NOTE — Patient Instructions (Addendum)
[facility] Discharge Instructions for Patients Receiving Chemotherapy  Today you received the following chemotherapy agents: gemcitabine (Gemzar) and carboplatin (Paraplatin).   To help prevent nausea and vomiting after your treatment, we encourage you to take your nausea medication as prescribed by your physician.    If you develop nausea and vomiting that is not controlled by your nausea medication, call the clinic.   BELOW ARE SYMPTOMS THAT SHOULD BE REPORTED IMMEDIATELY:  *FEVER GREATER THAN 100.5 F  *CHILLS WITH OR WITHOUT FEVER  NAUSEA AND VOMITING THAT IS NOT CONTROLLED WITH YOUR NAUSEA MEDICATION  *UNUSUAL SHORTNESS OF BREATH  *UNUSUAL BRUISING OR BLEEDING  TENDERNESS IN MOUTH AND THROAT WITH OR WITHOUT PRESENCE OF ULCERS  *URINARY PROBLEMS  *BOWEL PROBLEMS  UNUSUAL RASH Items with * indicate a potential emergency and should be followed up as soon as possible.  Feel free to call the clinic should you have any questions or concerns. The clinic phone number is [phone].  Please show the [facility] at check-in to the Emergency Department and triage nurse.  Gemcitabine injection What is this medicine? GEMCITABINE (jem SIT a been) is a chemotherapy drug. This medicine is used to treat many types of cancer like breast cancer, lung cancer, pancreatic cancer, and ovarian cancer. This medicine may be used for other purposes; ask your health care provider or pharmacist if you have questions. COMMON BRAND NAME(S): Gemzar What should I tell my health care provider before I take this medicine? They need to know if you have any of these conditions: -blood disorders -infection -kidney disease -liver disease -recent or ongoing radiation therapy -an unusual or allergic reaction to gemcitabine, other chemotherapy, other medicines, foods, dyes, or preservatives -pregnant or trying to get pregnant -breast-feeding How should I use this medicine? This  drug is given as an infusion into a vein. It is administered in a hospital or clinic by a specially trained health care professional. Talk to your pediatrician regarding the use of this medicine in children. Special care may be needed. Overdosage: If you think you have taken too much of this medicine contact a poison control center or emergency room at once. NOTE: This medicine is only for you. Do not share this medicine with others. What if I miss a dose? It is important not to miss your dose. Call your doctor or health care professional if you are unable to keep an appointment. What may interact with this medicine? -medicines to increase blood counts like filgrastim, pegfilgrastim, sargramostim -some other chemotherapy drugs like cisplatin -vaccines Talk to your doctor or health care professional before taking any of these medicines: -acetaminophen -aspirin -ibuprofen -ketoprofen -naproxen This list may not describe all possible interactions. Give your health care provider a list of all the medicines, herbs, non-prescription drugs, or dietary supplements you use. Also tell them if you smoke, drink alcohol, or use illegal drugs. Some items may interact with your medicine. What should I watch for while using this medicine? Visit your doctor for checks on your progress. This drug may make you feel generally unwell. This is not uncommon, as chemotherapy can affect healthy cells as well as cancer cells. Report any side effects. Continue your course of treatment even though you feel ill unless your doctor tells you to stop. In some cases, you may be given additional medicines to help with side effects. Follow all directions for their use. Call your doctor or health care professional for advice if you get a fever, chills or  sore throat, or other symptoms of a cold or flu. Do not treat yourself. This drug decreases your body's ability to fight infections. Try to avoid being around people who are  sick. This medicine may increase your risk to bruise or bleed. Call your doctor or health care professional if you notice any unusual bleeding. Be careful brushing and flossing your teeth or using a toothpick because you may get an infection or bleed more easily. If you have any dental work done, tell your dentist you are receiving this medicine. Avoid taking products that contain aspirin, acetaminophen, ibuprofen, naproxen, or ketoprofen unless instructed by your doctor. These medicines may hide a fever. Women should inform their doctor if they wish to become pregnant or think they might be pregnant. There is a potential for serious side effects to an unborn child. Talk to your health care professional or pharmacist for more information. Do not breast-feed an infant while taking this medicine. What side effects may I notice from receiving this medicine? Side effects that you should report to your doctor or health care professional as soon as possible: -allergic reactions like skin rash, itching or hives, swelling of the face, lips, or tongue -low blood counts - this medicine may decrease the number of white blood cells, red blood cells and platelets. You may be at increased risk for infections and bleeding. -signs of infection - fever or chills, cough, sore throat, pain or difficulty passing urine -signs of decreased platelets or bleeding - bruising, pinpoint red spots on the skin, black, tarry stools, blood in the urine -signs of decreased red blood cells - unusually weak or tired, fainting spells, lightheadedness -breathing problems -chest pain -mouth sores -nausea and vomiting -pain, swelling, redness at site where injected -pain, tingling, numbness in the hands or feet -stomach pain -swelling of ankles, feet, hands -unusual bleeding Side effects that usually do not require medical attention (report to your doctor or health care professional if they continue or are  bothersome): -constipation -diarrhea -hair loss -loss of appetite -stomach upset This list may not describe all possible side effects. Call your doctor for medical advice about side effects. You may report side effects to FDA at 1-800-FDA-1088. Where should I keep my medicine? This drug is given in a hospital or clinic and will not be stored at home. NOTE: This sheet is a summary. It may not cover all possible information. If you have questions about this medicine, talk to your doctor, pharmacist, or health care provider.  2018 [employer]vier/Gold Standard (2008-02-03 18:45:54)  Carboplatin injection What is this medicine? CARBOPLATIN (KAR boe pla tin) is a chemotherapy drug. It targets fast dividing cells, like cancer cells, and causes these cells to die. This medicine is used to treat ovarian cancer and many other cancers. This medicine may be used for other purposes; ask your health care provider or pharmacist if you have questions. COMMON BRAND NAME(S): Paraplatin What should I tell my health care provider before I take this medicine? They need to know if you have any of these conditions: -blood disorders -hearing problems -kidney disease -recent or ongoing radiation therapy -an unusual or allergic reaction to carboplatin, cisplatin, other chemotherapy, other medicines, foods, dyes, or preservatives -pregnant or trying to get pregnant -breast-feeding How should I use this medicine? This drug is usually given as an infusion into a vein. It is administered in a hospital or clinic by a specially trained health care professional. Talk to your pediatrician regarding the use of this medicine in  children. Special care may be needed. Overdosage: If you think you have taken too much of this medicine contact a poison control center or emergency room at once. NOTE: This medicine is only for you. Do not share this medicine with others. What if I miss a dose? It is important not to miss a dose.  Call your doctor or health care professional if you are unable to keep an appointment. What may interact with this medicine? -medicines for seizures -medicines to increase blood counts like filgrastim, pegfilgrastim, sargramostim -some antibiotics like amikacin, gentamicin, neomycin, streptomycin, tobramycin -vaccines Talk to your doctor or health care professional before taking any of these medicines: -acetaminophen -aspirin -ibuprofen -ketoprofen -naproxen This list may not describe all possible interactions. Give your health care provider a list of all the medicines, herbs, non-prescription drugs, or dietary supplements you use. Also tell them if you smoke, drink alcohol, or use illegal drugs. Some items may interact with your medicine. What should I watch for while using this medicine? Your condition will be monitored carefully while you are receiving this medicine. You will need important blood work done while you are taking this medicine. This drug may make you feel generally unwell. This is not uncommon, as chemotherapy can affect healthy cells as well as cancer cells. Report any side effects. Continue your course of treatment even though you feel ill unless your doctor tells you to stop. In some cases, you may be given additional medicines to help with side effects. Follow all directions for their use. Call your doctor or health care professional for advice if you get a fever, chills or sore throat, or other symptoms of a cold or flu. Do not treat yourself. This drug decreases your body's ability to fight infections. Try to avoid being around people who are sick. This medicine may increase your risk to bruise or bleed. Call your doctor or health care professional if you notice any unusual bleeding. Be careful brushing and flossing your teeth or using a toothpick because you may get an infection or bleed more easily. If you have any dental work done, tell your dentist you are receiving this  medicine. Avoid taking products that contain aspirin, acetaminophen, ibuprofen, naproxen, or ketoprofen unless instructed by your doctor. These medicines may hide a fever. Do not become pregnant while taking this medicine. Women should inform their doctor if they wish to become pregnant or think they might be pregnant. There is a potential for serious side effects to an unborn child. Talk to your health care professional or pharmacist for more information. Do not breast-feed an infant while taking this medicine. What side effects may I notice from receiving this medicine? Side effects that you should report to your doctor or health care professional as soon as possible: -allergic reactions like skin rash, itching or hives, swelling of the face, lips, or tongue -signs of infection - fever or chills, cough, sore throat, pain or difficulty passing urine -signs of decreased platelets or bleeding - bruising, pinpoint red spots on the skin, black, tarry stools, nosebleeds -signs of decreased red blood cells - unusually weak or tired, fainting spells, lightheadedness -breathing problems -changes in hearing -changes in vision -chest pain -high blood pressure -low blood counts - This drug may decrease the number of white blood cells, red blood cells and platelets. You may be at increased risk for infections and bleeding. -nausea and vomiting -pain, swelling, redness or irritation at the injection site -pain, tingling, numbness in the hands or feet -  problems with balance, talking, walking -trouble passing urine or change in the amount of urine Side effects that usually do not require medical attention (report to your doctor or health care professional if they continue or are bothersome): -hair loss -loss of appetite -metallic taste in the mouth or changes in taste This list may not describe all possible side effects. Call your doctor for medical advice about side effects. You may report side effects to  FDA at 1-800-FDA-1088. Where should I keep my medicine? This drug is given in a hospital or clinic and will not be stored at home. NOTE: This sheet is a summary. It may not cover all possible information. If you have questions about this medicine, talk to your doctor, pharmacist, or health care provider.  2018 Elsevier/Gold Standard (2007-12-30 14:38:05)

## 2018-09-22 NOTE — Assessment & Plan Note (Signed)
Her cancer pain is stable.  She will continue current prescription pain medicine along with Cymbalta

## 2018-09-22 NOTE — Assessment & Plan Note (Signed)
She has multifactorial anemia Her blood count is satisfactory today She denies recent bleeding complications

## 2018-09-22 NOTE — Progress Notes (Signed)
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  She will continue current prescription pain medicine along with Cymbalta   No orders of the defined types were placed in this encounter.   INTERVAL HISTORY: Please see below for problem oriented charting. She returns with her daughter for further follow-up She feels well She continues to have frequent bowel movement 3-4 times a day Her shortness of breath is stable She complains of fatigue Her pain is well controlled  SUMMARY OF ONCOLOGIC HISTORY: Oncology History   High grade serous ER 90%, PR 0% BRCA 1: no loss of expression MMR normal      Left ovarian epithelial cancer ([place])   02/18/2016 Tumor Marker    Patient's tumor was tested for the following markers: CA125 Results of the tumor marker test revealed 45    05/22/2016 Tumor Marker    Patient's tumor was tested for the following markers: CA125 Results of the tumor marker test revealed 53    05/22/2016 Imaging    Outside pelvic US showed 4.1 cm adnexa mass    06/24/2017 Imaging    Ct abdomen and pelvis:  1. Interim finding of  moderate ascites within the abdomen and pelvis with additional finding of diffuse nodular infiltration of the omentum and anterior mesenteric fat, the appearance would be consistent with peritoneal carcinomatosis/metastatic disease. Increasing retroperitoneal and upper abdominal adenopathy. 2. Re- demonstrated 3.8 cm cyst in the right adnexa. Enlarging soft tissue density in the left adnexa now with possible cystic component posteriorly. In light of the above findings, concern is for ovarian neoplasm. Correlation with pelvic ultrasound recommended. 3. Small right-sided pleural effusion, new since prior study 4. Stable hypodense splenic lesions since 2017.     06/25/2017 Imaging    US pelvis: 2.9 cm simple appearing cyst in the right ovary. Left ovary grossly unremarkable. Large volume ascites in the pelvis    06/30/2017 - 07/01/2017 Hospital Admission    She was admitted for evaluation of abdominal pain and ascites    07/01/2017 Pathology Results    PERITONEAL/ASCITIC FLUID(SPECIMEN 1 OF 1 COLLECTED 07/01/17): - POORLY DIFFERENTIATED CARCINOMA; SEE COMMENT Source Peritoneal/Ascitic Fluid, (specimen 1 of 1 collected 07/01/17) Gross Specimen: Received is/are 1000 cc's of brownish fluid. (BS:bs) Prepared: # Smears: 0 # Concentration Technique Slides (i.e. ThinPrep): 1 # Cell Block: 1 Additional Studies: Also received Hematology slide - [id number]. Comment The tumor cells are positive for cytokeratin 7 and Pax-8 but negative for cytokeratin 20, CDX-2, GATA-3, Napsin-A and TTF-1. Based on the immunoprofile a gynecology primary is favored    07/01/2017 Procedure    Successful ultrasound-guided diagnostic and therapeutic paracentesis yielding 2.5 liters of peritoneal fluid    07/07/2017 - 07/09/2017 Hospital Admission    She was admitted for management of malignant  ascites    07/08/2017 Procedure    Successful ultrasound-guided therapeutic paracentesis yielding 2.7 liters liters of peritoneal fluid     07/12/2017 Procedure    Successful ultrasound-guided paracentesis yielding 1450 mL of peritoneal fluid    07/18/2017 - 07/24/2017 Hospital Admission    She was admitted for expedited treatment    07/18/2017 Tumor Marker    Patient's tumor was tested for the following markers: CA125 Results of the tumor marker test revealed 1941    07/19/2017 - 02/04/2018 Chemotherapy    The patient had 6 cycles of carboplatin & Taxol for chemotherapy treatment, followed by 3 more cycles of carboplatin only     07/19/2017 - 02/04/2018 Chemotherapy    The patient had carboplatin and taxol    08/06/2017 Procedure    Successful ultrasound-guided therapeutic paracentesis yielding 2.6 liters of peritoneal fluid.    08/09/2017 Tumor Marker    Patient's tumor was tested for the following markers: CA125 Results of the tumor marker test revealed 1665    08/15/2017 Tumor Marker    Patient's tumor was tested for the following markers: CA125 Results of the tumor marker test revealed 937.9    08/20/2017 Imaging    ECHO: Normal LV size with EF 60-65%. Normal RV size and systolic function. No significant valvular abnormalities.    09/18/2017 Imaging    Chest Impression:  1. No evidence thoracic metastasis. 2. Interval increase and RIGHT pleural effusion.  Abdomen / Pelvis Impression:  1. Interval decrease in intraperitoneal free fluid. 2. Interval decrease in omental nodularity in the LEFT ventral peritoneal space. 3. Interval decrease in nodularity associated with the LEFT ovary. 4. Cystic portion of the RIGHT ovary is increased mildly in size.    09/20/2017 Tumor Marker    Patient's tumor was tested for the following markers: CA125 Results of the tumor marker test revealed 347    10/14/2017 Tumor Marker    Patient's tumor was tested for the following markers: CA125 Results of the tumor marker test revealed 307.4    11/04/2017 Tumor Marker    Patient's tumor was tested for the following markers:  CA125 Results of the tumor marker test revealed 262.5    11/28/2017 Imaging    1. Interval decrease in right pleural effusion with resolution of right atelectasis seen previously. 2. New small left pleural effusion, symmetric to the right. 3. No intraperitoneal free fluid on the current study. 4. Continued further decrease in left omental disease, appearing less confluent today than on the prior study. 5. Left ovary remains normal in appearance today and the right adnexal cystic lesion is decreased in size compared to prior study. 6. 14 mm pancreatic cyst is unchanged. Continued attention on follow-up imaging recommended. 7. Aortic Atherosclerois (ICD10-170.0)    12/13/2017 Tumor Marker    Patient's tumor was tested for the following markers: CA125 Results of the tumor marker test revealed 197.7    01/03/2018 Tumor Marker    Patient's tumor was tested for the following markers: CA125 Results of the tumor marker test revealed 183.1    01/14/2018 Tumor Marker    Patient's tumor was tested for the following markers: CA125 Results of the tumor marker test revealed 177.4    02/04/2018 Tumor Marker    Patient's tumor was tested for the following markers: CA125 Results of the tumor marker test revealed 168.5    02/25/2018 Imaging    1. Omental carcinomatosis appears qualitatively stable to slightly decreased. Stable mild peritoneal thickening in the paracolic gutters. Stable right  adnexal cyst. No ascites. No new or progressive metastatic disease in the abdomen or pelvis. 2. Small dependent right pleural effusion is increased. 3. Cystic pancreatic body lesion is decreased and now subcentimeter in size, suggesting a benign lesion. 4. Aortic Atherosclerosis (ICD10-I70.0).    03/03/2018 - 03/07/2018 Hospital Admission    She was hospitalized for GI bleed requiring blood transfusions. Xarelto was placed on hold    03/07/2018 PET scan    1. Persistent hazy omental interstitial nodularity but no  hypermetabolism or discrete measurable nodules. No abdominal ascites. 2. No findings for metastatic disease involving the chest. 3. Moderate-sized right pleural effusion and small left pleural effusion.     03/20/2018 Pathology Results    1. Ovary and fallopian tube, right - OVARY AND FALLOPIAN TUBE INVOLVED BY SEROUS CARCINOMA. - PARATUBAL CYST. 2. Uterus +/- tubes/ovaries, neoplastic, cervix, left ovary and fallopian tube - LEFT OVARY: HIGH GRADE SEROUS CARCINOMA WITH TREATMENT EFFECT, SPANNING 2.5 CM. CARCINOMA INVOLVES OVARIAN SURFACE. SEE ONCOLOGY TABLE. - LEFT FALLOPIAN TUBE: INVOLVED BY SEROUS CARCINOMA. - UTERUS: -ENDOMETRIUM: INACTIVE ENDOMETRIUM. NO HYPERPLASIA OR MALIGNANCY. -MYOMETRIUM: UNREMARKABLE. NO MALIGNANCY. -SEROSA: INVOLVED BY SEROUS CARCINOMA. - CERVIX: ENDOCERVICAL POLYP. NO MALIGNANCY. 3. Omentum, resection for tumor - INVOLVED BY SEROUS CARCINOMA. 4. Soft tissue, biopsy, mesenteric nodule - INVOLVED BY SEROUS CARCINOMA. Microscopic Comment 2. OVARY or FALLOPIAN TUBE or PRIMARY PERITONEUM: Procedure: Total hysterectomy and bilateral salpingo-oophorectomy. Omentectomy. Mesenteric lymph node biopsy. Specimen Integrity: Intact. Tumor Site: Left ovary. Ovarian Surface Involvement (required only if applicable): Present. Fallopian Tube Surface Involvement (required only if applicable): Present, bilateral. Tumor Size: 2.5 cm. Histologic Type: High grade serous carcinoma. Histologic Grade: High grade. Implants (required for advanced stage serous/seromucinous borderline tumors only): N/A. Other Tissue/ Organ Involvement: Bilateral fallopian tubes, right ovary, uterine serosa, omentum. Largest Extrapelvic Peritoneal Focus (required only if applicable): Microscopic, estimated 0.5 cm (omentum). Peritoneal/Ascitic Fluid: Prior Positive ([id number]). Treatment Effect (required only for high-grade serous carcinomas): Present in left ovary. CRS2. Regional Lymph Nodes: No  lymph nodes submitted/identified. Pathologic Stage Classification (pTNM, AJCC 8th Edition): ypT3b, ypNX Representative Tumor Block: 1A, 1B, 858F, 58F. Comment(s): The right ovary has only surface deposits with a large paratubal cyst. The left ovary has intraparenchymal tumor with associated treatment effect. Thus the tumor location is classified as a left ovarian primary.    03/20/2018 Surgery    Procedure(s) Performed:  1. Exploratory laparotomy with total hysterectomy and bilateral salpingo-oophorectomy 2. Infragastic Omentectomy  3. Debulking to <1cm gross residual disease   Surgeon: Mart Piggs, MD  Specimens: Uterus Cervix, Bilateral tubes / ovaries and omentum. Mesenteric nodule.  Operative Findings: Debulked to gross residual disease <1cm; however there is miliary disease in multiple locations including the majority of the abdominal peritoneum (anterior abdominal wall, bilateral gutters), diaphragm (Right>left), majority of small bowel mesentary. Normal appendix. Normal small uterus. Right ovary with a cystic lesion ~3cm, some adhesive disease of right adnexa to rectum/sigmoid. Gross omental disease, which was resected with the omentectomy. Smooth liver surface, but again, diaphragmatic disease noted.       03/20/2018 Genetic Testing    Patient has genetic testing done for ER/PR. Results revealed patient has ER: 90%, PR 0%.     03/31/2018 Tumor Marker    Patient's tumor was tested for the following markers: CA125 Results of the tumor marker test revealed 215.8     Genetic Testing    Patient has genetic testing done for BRCA 1. Results revealed patient has the following: BRCA 1: no  loss of expression.     Genetic Testing    Patient has genetic testing done for MMR . Results revealed patient has the following:  MMR: normal    03/31/2018 Genetic Testing    Patient has genetic testing done for BRCA1/2. Results revealed patient has no actionable mutations. She is  found to have [place] genetic change of unknown significance    04/21/2018 Imaging    1. No definite findings of residual or recurrent metastatic disease in the abdomen or pelvis status post interval TAHBSO and omentectomy. Stable minimal thickening in the paracolic gutters without discrete peritoneal nodularity. 2. Trace free fluid in the pelvic cul-de-sac. 3. Stable small dependent right pleural effusion. 4. Subcentimeter pancreatic body cystic lesion is stable to slightly decreased. 5. Aortic Atherosclerosis (ICD10-I70.0).    04/21/2018 Tumor Marker    Patient's tumor was tested for the following markers: CA125 Results of the tumor marker test revealed 187.3    04/23/2018 - 09/12/2018 Anti-estrogen oral therapy    She is placed on Femara    05/08/2018 Procedure    Successful ultrasound guided right thoracentesis yielding 800 mL of pleural fluid. Fluid cytology is negative for malignancy     05/28/2018 Tumor Marker    Patient's tumor was tested for the following markers: CA125 Results of the tumor marker test revealed 190    06/30/2018 Tumor Marker    Patient's tumor was tested for the following markers: CA125 Results of the tumor marker test revealed 148    07/23/2018 Imaging    Status post hysterectomy and bilateral salpingo-oophorectomy.  Very mild peritoneal thickening/nodularity, equivocal but worrisome for very mild peritoneal disease. Attention on follow-up is suggested.  Small right pleural effusion with indwelling pleural drain.    07/23/2018 Tumor Marker    Patient's tumor was tested for the following markers: CA125 Results of the tumor marker test revealed 215    09/11/2018 Tumor Marker    Patient's tumor was tested for the following markers: CA-125. Results of the tumor marker test revealed 1323    09/12/2018 Imaging    1. Mildly progressive ascites with some mild degree of nodularity most appreciable along the left adnexa, right paracolic gutter, right upper quadrant,  compatible with peritoneal spread of tumor. 2. Small right pleural effusion with indwelling right pleural catheter again noted. Mild increase in atelectasis anteriorly at the right lung base. 3. Aortic Atherosclerosis (ICD10-I70.0). Coronary atherosclerosis. 4. Several tiny hypodense lesions in the spleen are technically nonspecific, but stable. 5. Air fluid level in the rectum compatible with diarrheal process. 6. Prominent bilateral hip arthropathy. Left hip screw noted.    09/22/2018 -  Chemotherapy    The patient had palonosetron (ALOXI) injection 0.25 mg, 0.25 mg, Intravenous,  Once, 1 of 4 cycles Administration: 0.25 mg (09/22/2018) CARBOplatin (PARAPLATIN) 270 mg in sodium chloride 0.9 % 250 mL chemo infusion, 270 mg (100 % of original dose 268.8 mg), Intravenous,  Once, 1 of 4 cycles Dose modification: 268.8 mg (original dose 268.8 mg, Cycle 1) Administration: 270 mg (09/22/2018) gemcitabine (GEMZAR) 950 mg in sodium chloride 0.9 % 250 mL chemo infusion, 500 mg/m2 = 950 mg (50 % of original dose 1,000 mg/m2), Intravenous,  Once, 1 of 4 cycles Dose modification: 800 mg/m2 (80 % of original dose 1,000 mg/m2, Cycle 1, Reason: Dose Not Tolerated), 500 mg/m2 (50 % of original dose 1,000 mg/m2, Cycle 1, Reason: Dose Not Tolerated) Administration: 950 mg (09/22/2018) fosaprepitant (EMEND) 150 mg, dexamethasone (DECADRON) 12 mg in sodium chloride 0.9 %  145 mL IVPB, , Intravenous,  Once, 1 of 4 cycles Administration:  (09/22/2018)  for chemotherapy treatment.      REVIEW OF SYSTEMS:   Constitutional: Denies fevers, chills or abnormal weight loss Eyes: Denies blurriness of vision Ears, nose, mouth, throat, and face: Denies mucositis or sore throat Cardiovascular: Denies palpitation, chest discomfort or lower extremity swelling Skin: Denies abnormal skin rashes Lymphatics: Denies new lymphadenopathy or easy bruising Neurological:Denies numbness, tingling or new  weaknesses Behavioral/Psych: Mood is stable, no new changes  All other systems were reviewed with the patient and are negative.  I have reviewed the past medical history, past surgical history, social history and family history with the patient and they are unchanged from previous note.  ALLERGIES:  is allergic to codeine.  MEDICATIONS:  Current Outpatient Medications  Medication Sig Dispense Refill  . diltiazem (DILACOR XR) 180 MG 24 hr capsule Take 180 mg by mouth every morning.    . Magnesium 400 MG CAPS Take 400 mg by mouth daily.    Marland Kitchen albuterol (PROAIR HFA) 108 (90 Base) MCG/ACT inhaler Inhale 2 puffs into the lungs every 6 (six) hours as needed for wheezing or shortness of breath. 1 Inhaler 3  . ALPRAZolam (XANAX) 0.25 MG tablet Take 1 tablet (0.25 mg total) by mouth at bedtime as needed for anxiety. 5 tablet 0  . amoxicillin (AMOXIL) 500 MG capsule TAKE 4 CAPSULES PO 1 HOUR PRIOR TO DENTAL APPOINTMENT  1  . azelastine (ASTELIN) 0.1 % nasal spray Place 2 sprays into both nostrils 2 (two) times daily as needed for allergies.     . Cholecalciferol (VITAMIN D) 2000 units CAPS Take 2,000 Units by mouth daily.     Marland Kitchen dicyclomine (BENTYL) 10 MG capsule Take 1 tab by mouth every morning. May take twice daily as needed. (Patient taking differently: Take 10 mg by mouth 2 (two) times daily. ) 60 capsule 6  . diphenhydrAMINE (BENADRYL) 25 mg capsule Take 25 mg by mouth daily as needed for itching.     . diphenoxylate-atropine (LOMOTIL) 2.5-0.025 MG tablet Take 1 tablet by mouth 4 (four) times daily. (Patient taking differently: Take 1 tablet by mouth 4 (four) times daily as needed for diarrhea or loose stools. ) 360 tablet 1  . DULoxetine (CYMBALTA) 60 MG capsule Take 30 mg by mouth daily.     . fluticasone (FLONASE) 50 MCG/ACT nasal spray Place 2 sprays into both nostrils 2 (two) times daily as needed (FOR NASAL CONGESTION.).     Marland Kitchen furosemide (LASIX) 20 MG tablet Take 2 tablets (40 mg total) by  mouth daily. 180 tablet 3  . halobetasol (ULTRAVATE) 0.05 % cream Apply 1 application topically 2 (two) times daily as needed (psoriasis).     Marland Kitchen levothyroxine (SYNTHROID, LEVOTHROID) 137 MCG tablet Take 137 mcg by mouth daily before breakfast. For thyroid therapy    . mirtazapine (REMERON) 15 MG tablet TAKE 1 TABLET(15 MG) BY MOUTH AT BEDTIME (Patient taking differently: Take 15 mg by mouth at bedtime. ) 30 tablet 11  . Oxycodone HCl 10 MG TABS Take 1 tablet (10 mg total) by mouth every 6 (six) hours as needed. 60 tablet 0  . polyvinyl alcohol (LIQUIFILM TEARS) 1.4 % ophthalmic solution Place 1 drop into both eyes 2 (two) times daily as needed for dry eyes.     . potassium chloride SA (K-DUR,KLOR-CON) 20 MEQ tablet Take 10 mEq by mouth daily.    . rivaroxaban (XARELTO) 20 MG TABS tablet Take 20 mg  by mouth daily with supper.    Marland Kitchen STIOLTO RESPIMAT 2.5-2.5 MCG/ACT AERS INHALE 2 PUFFS INTO THE LUNGS DAILY 4 g 5  . Ustekinumab (STELARA [place]) Inject 1 Dose into the skin every 3 (three) months.     No current facility-administered medications for this visit.    Facility-Administered Medications Ordered in Other Visits  Medication Dose Route Frequency Provider Last Rate Last Dose  . sodium chloride flush (NS) 0.9 % injection 10 mL  10 mL Intracatheter PRN Alvy Bimler, Hasnain Manheim, MD   10 mL at 09/22/18 1655    PHYSICAL EXAMINATION: ECOG PERFORMANCE STATUS: 2 - Symptomatic, <50% confined to bed  Vitals:   09/22/18 1241  BP: (!) 128/48  Pulse: 99  Resp: 18  Temp: 98.1 F (36.7 C)  SpO2: 100%   Filed Weights   09/22/18 1241  Weight: 171 lb 9.6 oz (77.8 kg)    GENERAL:alert, no distress and comfortable SKIN: skin color, texture, turgor are normal, no rashes or significant lesions EYES: normal, Conjunctiva are pink and non-injected, sclera clear OROPHARYNX:no exudate, no erythema and lips, buccal mucosa, and tongue normal  NECK: supple, thyroid normal size, non-tender, without nodularity LYMPH:  no  palpable lymphadenopathy in the cervical, axillary or inguinal LUNGS: clear to auscultation and percussion with normal breathing effort HEART: regular rate & rhythm and no murmurs and no lower extremity edema ABDOMEN:abdomen soft, non-tender and normal bowel sounds Musculoskeletal:no cyanosis of digits and no clubbing  NEURO: alert & oriented x 3 with fluent speech, no focal motor/sensory deficits  LABORATORY DATA:  I have reviewed the data as listed    Component Value Date/Time   NA 135 09/22/2018 1158   NA 135 (L) 09/24/2017 0945   K 3.3 (L) 09/22/2018 1158   K 3.7 09/24/2017 0945   CL 97 (L) 09/22/2018 1158   CO2 29 09/22/2018 1158   CO2 30 (H) 09/24/2017 0945   GLUCOSE 124 (H) 09/22/2018 1158   GLUCOSE 101 09/24/2017 0945   BUN 18 09/22/2018 1158   BUN 15.3 09/24/2017 0945   CREATININE 1.09 (H) 09/22/2018 1158   CREATININE 1.00 03/13/2018 0940   CREATININE 0.8 09/24/2017 0945   CALCIUM 9.2 09/22/2018 1158   CALCIUM 9.1 09/24/2017 0945   PROT 7.1 09/22/2018 1158   PROT 6.6 09/24/2017 0945   ALBUMIN 3.2 (L) 09/22/2018 1158   ALBUMIN 3.3 (L) 09/24/2017 0945   AST 15 09/22/2018 1158   AST 14 03/13/2018 0940   AST 16 09/24/2017 0945   ALT 9 09/22/2018 1158   ALT 7 03/13/2018 0940   ALT 12 09/24/2017 0945   ALKPHOS 58 09/22/2018 1158   ALKPHOS 52 09/24/2017 0945   BILITOT 0.3 09/22/2018 1158   BILITOT 0.3 03/13/2018 0940   BILITOT 1.13 09/24/2017 0945   GFRNONAA 47 (L) 09/22/2018 1158   GFRNONAA 51 (L) 03/13/2018 0940   GFRAA 54 (L) 09/22/2018 1158   GFRAA 59 (L) 03/13/2018 0940    No results found for: SPEP, UPEP  Lab Results  Component Value Date   WBC 8.9 09/22/2018   NEUTROABS 6.6 09/22/2018   HGB 8.8 (L) 09/22/2018   HCT 28.0 (L) 09/22/2018   MCV 82.1 09/22/2018   PLT 255 09/22/2018      Chemistry      Component Value Date/Time   NA 135 09/22/2018 1158   NA 135 (L) 09/24/2017 0945   K 3.3 (L) 09/22/2018 1158   K 3.7 09/24/2017 0945   CL 97 (L)  09/22/2018 1158  CO2 29 09/22/2018 1158   CO2 30 (H) 09/24/2017 0945   BUN 18 09/22/2018 1158   BUN 15.3 09/24/2017 0945   CREATININE 1.09 (H) 09/22/2018 1158   CREATININE 1.00 03/13/2018 0940   CREATININE 0.8 09/24/2017 0945      Component Value Date/Time   CALCIUM 9.2 09/22/2018 1158   CALCIUM 9.1 09/24/2017 0945   ALKPHOS 58 09/22/2018 1158   ALKPHOS 52 09/24/2017 0945   AST 15 09/22/2018 1158   AST 14 03/13/2018 0940   AST 16 09/24/2017 0945   ALT 9 09/22/2018 1158   ALT 7 03/13/2018 0940   ALT 12 09/24/2017 0945   BILITOT 0.3 09/22/2018 1158   BILITOT 0.3 03/13/2018 0940   BILITOT 1.13 09/24/2017 0945       RADIOGRAPHIC STUDIES: I have personally reviewed the radiological images as listed and agreed with the findings in the report. Dg Chest 2 View  Result Date: 09/11/2018 CLINICAL DATA:  Shortness of breath. Pleural effusion. EXAM: CHEST - 2 VIEW COMPARISON:  08/14/2018 FINDINGS: Right jugular Port-A-Cath remains in place terminating over the lower SVC, unchanged. A right-sided tunneled pleural catheter catheter is unchanged. The cardiomediastinal silhouette is unchanged with normal heart size. Aortic atherosclerosis is noted. The lungs remain hyperinflated with minimal scarring or atelectasis in the lung bases. No acute airspace consolidation or edema is identified. There is likely a trace right apical pneumothorax, and there is at most a trace right pleural effusion. No acute osseous abnormality is identified. IMPRESSION: Trace right apical pneumothorax and at most a trace right pleural effusion with right pleural catheter in place. No evidence of acute airspace disease. Electronically Signed   By: Logan Bores M.D.   On: 09/11/2018 14:35   Ct Abdomen Pelvis W Contrast  Result Date: 09/12/2018 CLINICAL DATA:  Ovarian cancer restaging EXAM: CT ABDOMEN AND PELVIS WITH CONTRAST TECHNIQUE: Multidetector CT imaging of the abdomen and pelvis was performed using the standard  protocol following bolus administration of intravenous contrast. CONTRAST:  147m OMNIPAQUE IOHEXOL 300 MG/ML  SOLN COMPARISON:  Multiple exams, including 07/22/2018 FINDINGS: Lower chest: Small right pleural effusion with indwelling pleural catheter subsegmental atelectasis anteriorly right lower lobe and in the right middle lobe. Right coronary artery atherosclerotic calcification. Hepatobiliary: Cholecystectomy. No liver mass observed. Common bile duct 10 mm in diameter, stable. Pancreas: Unremarkable Spleen: Stable but nonspecific 0.7 by 0.6 cm hypodense lesion medially in the spleen on image 14/2. At least 3 additional smaller hypodensities in the spleen are likewise stable nonspecific. Adrenals/Urinary Tract: Unremarkable Stomach/Bowel: Air-fluid level the rectum compatible with diarrheal process. Scattered normal sized loops of small bowel. Vascular/Lymphatic: Aortoiliac atherosclerotic vascular disease. Reproductive: Prior hysterectomy and bilateral salpingo-oophorectomy. Other: Scattered mild ascites. This is increased from prior. Nodularity along the paracolic gutters and along some of the ascites, for example in the right upper quadrant adjacent to the proximal transverse colon on images 27 through 22 of series 5, compatible with peritoneal tumor. This is felt overall be mildly increased from prior. For example a small tumor deposit along the left adnexa measures 8 mm in thickness on image 66/2, previously 5 mm. Musculoskeletal: Left hip screw. 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 Stable superior endplate compression at L5 with grade 1 degenerative retrolisthesis at L4-5. Lower thoracic lumbar spondylosis. Small lax umbilical hernia containing adipose tissue and a margin of the colon. IMPRESSION: 1. Mildly progressive ascites with some mild degree of nodularity most appreciable along the left adnexa, right paracolic gutter, right upper quadrant, compatible with  peritoneal spread of tumor. 2.  Small right pleural effusion with indwelling right pleural catheter again noted. Mild increase in atelectasis anteriorly at the right lung base. 3.  Aortic Atherosclerosis (ICD10-I70.0).  Coronary atherosclerosis. 4. Several tiny hypodense lesions in the spleen are technically nonspecific, but stable. 5. Air fluid level in the rectum compatible with diarrheal process. 6. Prominent bilateral hip arthropathy.  Left hip screw noted. Electronically Signed   By: Van Clines M.D.   On: 09/12/2018 07:13   US Abdomen Limited  Result Date: 09/05/2018 CLINICAL DATA:  Evaluate for ascites. EXAM: ULTRASOUND ABDOMEN LIMITED COMPARISON:  CT 07/22/2018. FINDINGS: No evidence of mass lesion. Trace amount of free fluid noted in the pelvis. No large volume of ascites noted IMPRESSION: Trace amount of free fluid in the pelvis. No large volume of ascites noted. Electronically Signed   By: Marcello Moores  Register   On: 09/05/2018 10:17    All questions were answered. The patient knows to call the clinic with any problems, questions or concerns. No barriers to learning was detected.  I spent 15 minutes counseling the patient face to face. The total time spent in the appointment was 20 minutes and more than 50% was on counseling and review of test results  Heath Lark, MD 09/22/2018 5:13 PM

## 2018-09-22 NOTE — Assessment & Plan Note (Signed)
As previously discussed, she will undergo palliative chemotherapy as scheduled I plan to see her back next week for further follow-up

## 2018-09-23 ENCOUNTER — Telehealth: Payer: Self-pay | Admitting: Hematology and Oncology

## 2018-09-23 ENCOUNTER — Encounter (HOSPITAL_COMMUNITY): Payer: Medicare Other

## 2018-09-23 NOTE — Telephone Encounter (Signed)
Per 12/16 no new orders

## 2018-09-25 ENCOUNTER — Encounter (HOSPITAL_COMMUNITY): Payer: Medicare Other

## 2018-09-26 NOTE — Progress Notes (Signed)
Discharge Progress Report  Patient Details  Name: [name] MRN: [id number] Date of Birth: 06-08-1933 Referring Provider:     [facility] from 08/27/2018 in [place]  Referring Provider  [place]        Number of Visits: 4  Reason for Discharge:  Early Exit:  Due recent diagnosis of Cancer.   Smoking History:  Social History   Tobacco Use  Smoking Status Former Smoker  . Packs/day: 1.00  . Years: 20.00  . Pack years: 20.00  . Types: Cigarettes  . Last attempt to quit: 10/08/1980  . Years since quitting: 37.9  Smokeless Tobacco Never Used    Diagnosis:  Panlobular emphysema ([place])  ADL [facility]: Pulmonary Assessment Scores    Row Name 08/27/18 1058         ADL [facility]   ADL Phase  Entry     SOB Score total  73     Rest  1     Walk  12     Stairs  5     Bath  3     Dress  4     Shop  5       CAT Score   CAT Score  22       mMRC Score   mMRC Score  3        Initial Exercise Prescription: Initial Exercise Prescription - 08/27/18 1000      Date of Initial Exercise RX and Referring Provider   Date  08/27/18    Referring Provider  [facility]     Expected Discharge Date  11/27/18      Treadmill   MPH  0.8    Grade  0    Minutes  17    METs  1.61      NuStep   Level  1    SPM  48    Minutes  17    METs  1.9      Prescription Details   Frequency (times per week)  2    Duration  Progress to 30 minutes of continuous aerobic without signs/symptoms of physical distress      Intensity   THRR 40-80% of Max Heartrate  110-119-128    Ratings of Perceived Exertion  11-13    Perceived Dyspnea  0-4      Progression   Progression  Continue to progress workloads to maintain intensity without signs/symptoms of physical distress.      Resistance Training   Training Prescription  Yes    Weight  1    Reps  10-15       Discharge Exercise Prescription (Final Exercise Prescription Changes): Exercise Prescription  Changes - 09/11/18 0800      Response to Exercise   Blood Pressure (Admit)  138/60    Blood Pressure (Exercise)  140/58    Blood Pressure (Exit)  110/52    Heart Rate (Admit)  109 bpm    Heart Rate (Exercise)  124 bpm    Heart Rate (Exit)  106 bpm    Oxygen Saturation (Admit)  97 %    Oxygen Saturation (Exercise)  96 %    Oxygen Saturation (Exit)  97 %    Rating of Perceived Exertion (Exercise)  11    Perceived Dyspnea (Exercise)  11    Comments  First two weeks of exercise sessions     Duration  Progress to 30 minutes of  aerobic without signs/symptoms of physical  distress    Intensity  THRR New   110-119-128     Progression   Progression  Continue to progress workloads to maintain intensity without signs/symptoms of physical distress.    Average METs  1.6      Resistance Training   Training Prescription  Yes    Weight  1    Reps  10-15      Treadmill   MPH  0.8    Grade  0    Minutes  17    METs  1.61      NuStep   Level  1    SPM  48    Minutes  22    METs  1.6       Functional Capacity: 6 Minute Walk    Row Name 08/27/18 1038         6 Minute Walk   Phase  Initial     Distance  600 feet     Walk Time  5 minutes     # of Rest Breaks  1 got tired      MPH  1.14     METS  1.87     RPE  13     Perceived Dyspnea   13     VO2 Peak  4.81     Symptoms  Yes (comment)     Comments  5/10 bilateral buttocks pain      Resting HR  92 bpm     Resting BP  120/50     Resting Oxygen Saturation   95 %     Exercise Oxygen Saturation  during 6 min walk  94 %     Max Ex. HR  135 bpm     Max Ex. BP  154/52     2 Minute Post BP  130/54        Psychological, QOL, Others - Outcomes: PHQ 2/9: Depression screen [facility] 2/9 08/27/2018 12/18/2017  Decreased Interest 0 1  Down, Depressed, Hopeless 0 3  PHQ - 2 Score 0 4  Altered sleeping 1 3  Tired, decreased energy 2 3  Change in appetite 1 0  Feeling bad or failure about yourself  2 0  Trouble concentrating 1 1   Moving slowly or fidgety/restless 0 0  Suicidal thoughts - 0  PHQ-9 Score 7 11  Difficult doing work/chores Somewhat difficult Somewhat difficult  Some recent data might be hidden    Quality of Life: Quality of Life - 08/27/18 1044      Quality of Life   Select  Quality of Life      Quality of Life Scores   Health/Function Pre  14.8 %    Socioeconomic Pre  22.8 %    Psych/Spiritual Pre  23.14 %    Family Pre  30 %    GLOBAL Pre  20.25 %       Personal Goals: Goals established at orientation with interventions provided to work toward goal. Personal Goals and Risk Factors at Admission - 08/27/18 1102      Core Components/Risk Factors/Patient Goals on Admission    Weight Management  Weight Maintenance    Personal Goal Other  Yes    Personal Goal  Return to shopping, become more independent and be able to go places by herself.     Intervention  Attend program 2 x week and supplement with at home exercise 3 x week.     Expected Outcomes  Reach personal goals.  Personal Goals Discharge: Goals and Risk Factor Review    Row Name 09/10/18 1419             Core Components/Risk Factors/Patient Goals Review   Personal Goals Review  Weight Management/Obesity;Improve shortness of breath with ADL's Be more independent; do her own shopping; do things by herself.        Review  Patient is new to the program. She has completed 3 sessions. Will continue to monitor for progress.        Expected Outcomes  Patient will continue to attend sessions and complete the program meeting her personal goals.           Exercise Goals and Review: Exercise Goals    Row Name 08/27/18 1042             Exercise Goals   Increase Physical Activity  Yes       Intervention  Provide advice, education, support and counseling about physical activity/exercise needs.;Develop an individualized exercise prescription for aerobic and resistive training based on initial evaluation findings, risk  stratification, comorbidities and participant's personal goals.       Expected Outcomes  Short Term: Attend rehab on a regular basis to increase amount of physical activity.       Increase Strength and Stamina  Yes       Intervention  Provide advice, education, support and counseling about physical activity/exercise needs.;Develop an individualized exercise prescription for aerobic and resistive training based on initial evaluation findings, risk stratification, comorbidities and participant's personal goals.       Expected Outcomes  Short Term: Increase workloads from initial exercise prescription for resistance, speed, and METs.       Able to understand and use rate of perceived exertion (RPE) scale  Yes       Intervention  Provide education and explanation on how to use RPE scale       Expected Outcomes  Short Term: Able to use RPE daily in rehab to express subjective intensity level;Long Term:  Able to use RPE to guide intensity level when exercising independently       Able to understand and use Dyspnea scale  Yes       Intervention  Provide education and explanation on how to use Dyspnea scale       Expected Outcomes  Short Term: Able to use Dyspnea scale daily in rehab to express subjective sense of shortness of breath during exertion;Long Term: Able to use Dyspnea scale to guide intensity level when exercising independently       Knowledge and understanding of Target Heart Rate Range (THRR)  Yes       Intervention  Provide education and explanation of THRR including how the numbers were predicted and where they are located for reference       Expected Outcomes  Short Term: Able to state/look up THRR;Long Term: Able to use THRR to govern intensity when exercising independently;Short Term: Able to use daily as guideline for intensity in rehab       Able to check pulse independently  Yes       Intervention  Provide education and demonstration on how to check pulse in carotid and radial  arteries.;Review the importance of being able to check your own pulse for safety during independent exercise       Expected Outcomes  Short Term: Able to explain why pulse checking is important during independent exercise;Long Term: Able to check pulse independently and accurately  Understanding of Exercise Prescription  Yes       Intervention  Provide education, explanation, and written materials on patient's individual exercise prescription       Expected Outcomes  Short Term: Able to explain program exercise prescription;Long Term: Able to explain home exercise prescription to exercise independently          Exercise Goals Re-Evaluation: Exercise Goals Re-Evaluation    Row Name 09/02/18 1449             Exercise Goal Re-Evaluation   Exercise Goals Review  Increase Physical Activity;Increase Strength and Stamina;Able to understand and use rate of perceived exertion (RPE) scale;Understanding of Exercise Prescription;Knowledge and understanding of Target Heart Rate Range (THRR);Able to understand and use Dyspnea scale;Able to check pulse independently       Comments  Patient just started pulmonary rehab. She has attended 1 exercise session so far. We will monitor progress.       Expected Outcomes  Increase strength to do more on her own.           Nutrition & Weight - Outcomes: Pre Biometrics - 08/27/18 1043      Pre Biometrics   Height  5\' 4"  (1.626 m)    Weight  78.2 kg    Waist Circumference  41.5 inches    Hip Circumference  44 inches    Waist to Hip Ratio  0.94 %    BMI (Calculated)  29.58    Triceps Skinfold  16 mm    % Body Fat  40.5 %    Grip Strength  17.2 kg    Flexibility  0 in    Single Leg Stand  13 seconds        Nutrition: Nutrition Therapy & Goals - 09/10/18 1419      Nutrition Therapy   RD appointment deferred  Yes      Personal Nutrition Goals   Comments  Patient continues to eat a low sodium and heart healthy diet. She is encourage to meet with  RD.      Intervention Plan   Intervention  Nutrition handout(s) given to patient.       Nutrition Discharge: Nutrition Assessments - 08/27/18 1102      MEDFICTS Scores   Pre Score  33       Education Questionnaire Score: Knowledge Questionnaire Score - 08/27/18 1006      Knowledge Questionnaire Score   Pre Score  16/18

## 2018-09-29 ENCOUNTER — Inpatient Hospital Stay (HOSPITAL_BASED_OUTPATIENT_CLINIC_OR_DEPARTMENT_OTHER): Payer: Medicare Other | Admitting: Hematology and Oncology

## 2018-09-29 ENCOUNTER — Inpatient Hospital Stay: Payer: Medicare Other

## 2018-09-29 ENCOUNTER — Other Ambulatory Visit: Payer: Self-pay

## 2018-09-29 ENCOUNTER — Telehealth: Payer: Self-pay | Admitting: Hematology and Oncology

## 2018-09-29 ENCOUNTER — Ambulatory Visit: Payer: Self-pay

## 2018-09-29 ENCOUNTER — Ambulatory Visit: Payer: Self-pay | Admitting: Hematology and Oncology

## 2018-09-29 ENCOUNTER — Encounter: Payer: Self-pay | Admitting: Hematology and Oncology

## 2018-09-29 VITALS — BP 117/48 | HR 101 | Temp 98.0°F | Resp 18 | Ht 64.0 in | Wt 171.4 lb

## 2018-09-29 VITALS — BP 119/48 | HR 67 | Temp 98.7°F | Resp 18

## 2018-09-29 DIAGNOSIS — K649 Unspecified hemorrhoids: Secondary | ICD-10-CM

## 2018-09-29 DIAGNOSIS — N189 Chronic kidney disease, unspecified: Secondary | ICD-10-CM

## 2018-09-29 DIAGNOSIS — Z5111 Encounter for antineoplastic chemotherapy: Secondary | ICD-10-CM | POA: Diagnosis not present

## 2018-09-29 DIAGNOSIS — I2510 Atherosclerotic heart disease of native coronary artery without angina pectoris: Secondary | ICD-10-CM

## 2018-09-29 DIAGNOSIS — C562 Malignant neoplasm of left ovary: Secondary | ICD-10-CM

## 2018-09-29 DIAGNOSIS — R188 Other ascites: Secondary | ICD-10-CM

## 2018-09-29 DIAGNOSIS — R971 Elevated cancer antigen 125 [CA 125]: Secondary | ICD-10-CM

## 2018-09-29 DIAGNOSIS — G893 Neoplasm related pain (acute) (chronic): Secondary | ICD-10-CM

## 2018-09-29 DIAGNOSIS — E440 Moderate protein-calorie malnutrition: Secondary | ICD-10-CM

## 2018-09-29 DIAGNOSIS — D638 Anemia in other chronic diseases classified elsewhere: Secondary | ICD-10-CM

## 2018-09-29 DIAGNOSIS — I700 Atherosclerosis of aorta: Secondary | ICD-10-CM

## 2018-09-29 DIAGNOSIS — Z79899 Other long term (current) drug therapy: Secondary | ICD-10-CM

## 2018-09-29 DIAGNOSIS — Z79811 Long term (current) use of aromatase inhibitors: Secondary | ICD-10-CM

## 2018-09-29 DIAGNOSIS — Z90710 Acquired absence of both cervix and uterus: Secondary | ICD-10-CM

## 2018-09-29 DIAGNOSIS — M129 Arthropathy, unspecified: Secondary | ICD-10-CM

## 2018-09-29 DIAGNOSIS — J449 Chronic obstructive pulmonary disease, unspecified: Secondary | ICD-10-CM

## 2018-09-29 DIAGNOSIS — J90 Pleural effusion, not elsewhere classified: Secondary | ICD-10-CM

## 2018-09-29 DIAGNOSIS — R110 Nausea: Secondary | ICD-10-CM

## 2018-09-29 DIAGNOSIS — Z90722 Acquired absence of ovaries, bilateral: Secondary | ICD-10-CM

## 2018-09-29 DIAGNOSIS — Z7982 Long term (current) use of aspirin: Secondary | ICD-10-CM

## 2018-09-29 LAB — SAMPLE TO BLOOD BANK

## 2018-09-29 LAB — CBC WITH DIFFERENTIAL/PLATELET
Abs Immature Granulocytes: 0.11 10*3/uL — ABNORMAL HIGH (ref 0.00–0.07)
Basophils Absolute: 0 10*3/uL (ref 0.0–0.1)
Basophils Relative: 0 %
Eosinophils Absolute: 0.1 10*3/uL (ref 0.0–0.5)
Eosinophils Relative: 2 %
HCT: 23.3 % — ABNORMAL LOW (ref 36.0–46.0)
Hemoglobin: 7.1 g/dL — ABNORMAL LOW (ref 12.0–15.0)
Immature Granulocytes: 2 %
LYMPHS PCT: 20 %
Lymphs Abs: 1.1 10*3/uL (ref 0.7–4.0)
MCH: 25.3 pg — AB (ref 26.0–34.0)
MCHC: 30.5 g/dL (ref 30.0–36.0)
MCV: 82.9 fL (ref 80.0–100.0)
MONO ABS: 0.4 10*3/uL (ref 0.1–1.0)
Monocytes Relative: 7 %
Neutro Abs: 4 10*3/uL (ref 1.7–7.7)
Neutrophils Relative %: 69 %
Platelets: 180 10*3/uL (ref 150–400)
RBC: 2.81 MIL/uL — ABNORMAL LOW (ref 3.87–5.11)
RDW: 14 % (ref 11.5–15.5)
WBC: 5.8 10*3/uL (ref 4.0–10.5)
nRBC: 0 % (ref 0.0–0.2)

## 2018-09-29 LAB — COMPREHENSIVE METABOLIC PANEL
ALT: 15 U/L (ref 0–44)
AST: 21 U/L (ref 15–41)
Albumin: 3 g/dL — ABNORMAL LOW (ref 3.5–5.0)
Alkaline Phosphatase: 58 U/L (ref 38–126)
Anion gap: 9 (ref 5–15)
BUN: 17 mg/dL (ref 8–23)
CO2: 30 mmol/L (ref 22–32)
CREATININE: 0.99 mg/dL (ref 0.44–1.00)
Calcium: 8.7 mg/dL — ABNORMAL LOW (ref 8.9–10.3)
Chloride: 98 mmol/L (ref 98–111)
GFR calc Af Amer: >60 mL/min (ref >60)
GFR calc non Af Amer: 53 mL/min — ABNORMAL LOW (ref >60)
Glucose, Bld: 97 mg/dL (ref 70–99)
Potassium: 4 mmol/L (ref 3.5–5.1)
Sodium: 137 mmol/L (ref 135–145)
Total Bilirubin: <0.2 mg/dL — ABNORMAL LOW (ref 0.3–1.2)
Total Protein: 6.6 g/dL (ref 6.5–8.1)

## 2018-09-29 LAB — PREPARE RBC (CROSSMATCH)

## 2018-09-29 LAB — MAGNESIUM: MAGNESIUM: 1.4 mg/dL — AB (ref 1.7–2.4)

## 2018-09-29 LAB — ABO/RH: ABO/RH(D): O POS

## 2018-09-29 MED ORDER — SODIUM CHLORIDE 0.9% FLUSH
3.0000 mL | INTRAVENOUS | Status: DC | PRN
  Filled 2018-09-29: qty 10

## 2018-09-29 MED ORDER — SODIUM CHLORIDE 0.9% FLUSH
10.0000 mL | Freq: Once | INTRAVENOUS | Status: AC
  Administered 2018-09-29: 10 mL
  Filled 2018-09-29: qty 10

## 2018-09-29 MED ORDER — SODIUM CHLORIDE 0.9% FLUSH
10.0000 mL | INTRAVENOUS | Status: DC | PRN
  Filled 2018-09-29: qty 10

## 2018-09-29 MED ORDER — ACETAMINOPHEN 325 MG PO TABS
ORAL_TABLET | ORAL | Status: AC
  Filled 2018-09-29: qty 2

## 2018-09-29 MED ORDER — PROCHLORPERAZINE MALEATE 10 MG PO TABS: 10.0000 mg | ORAL_TABLET | Freq: Once | ORAL | Status: DC

## 2018-09-29 MED ORDER — SODIUM CHLORIDE 0.9% IV SOLUTION
250.0000 mL | Freq: Once | INTRAVENOUS | Status: AC
  Administered 2018-09-29: 250 mL via INTRAVENOUS
  Filled 2018-09-29: qty 250

## 2018-09-29 MED ORDER — DIPHENHYDRAMINE HCL 25 MG PO CAPS
ORAL_CAPSULE | ORAL | Status: AC
  Filled 2018-09-29: qty 1

## 2018-09-29 MED ORDER — DIPHENHYDRAMINE HCL 25 MG PO CAPS
25.0000 mg | ORAL_CAPSULE | Freq: Once | ORAL | Status: AC
  Administered 2018-09-29: 25 mg via ORAL

## 2018-09-29 MED ORDER — SODIUM CHLORIDE 0.9 % IV SOLN
Freq: Once | INTRAVENOUS | Status: DC
  Filled 2018-09-29: qty 250

## 2018-09-29 MED ORDER — HEPARIN SOD (PORK) LOCK FLUSH 100 UNIT/ML IV SOLN
500.0000 [IU] | Freq: Once | INTRAVENOUS | Status: DC | PRN
  Filled 2018-09-29: qty 5

## 2018-09-29 MED ORDER — SODIUM CHLORIDE 0.9 % IV SOLN: 500.0000 mg/m2 | Freq: Once | INTRAVENOUS | Status: DC

## 2018-09-29 MED ORDER — HEPARIN SOD (PORK) LOCK FLUSH 100 UNIT/ML IV SOLN
250.0000 [IU] | INTRAVENOUS | Status: DC | PRN
  Filled 2018-09-29: qty 5

## 2018-09-29 MED ORDER — HEPARIN SOD (PORK) LOCK FLUSH 100 UNIT/ML IV SOLN
500.0000 [IU] | Freq: Every day | INTRAVENOUS | Status: DC | PRN
  Filled 2018-09-29: qty 5

## 2018-09-29 MED ORDER — ACETAMINOPHEN 325 MG PO TABS
650.0000 mg | ORAL_TABLET | Freq: Once | ORAL | Status: AC
  Administered 2018-09-29: 650 mg via ORAL

## 2018-09-29 NOTE — Assessment & Plan Note (Signed)
She has mild wheezes on physical exam. She will continue her regular inhalers

## 2018-09-29 NOTE — Patient Instructions (Addendum)
Blood Transfusion, Adult, Care After This sheet gives you information about how to care for yourself after your procedure. Your doctor may also give you more specific instructions. If you have problems or questions, contact your doctor. Follow these instructions at home:   Take over-the-counter and prescription medicines only as told by your doctor.  Go back to your normal activities as told by your doctor.  Follow instructions from your doctor about how to take care of the area where an IV tube was put into your vein (insertion site). Make sure you: ? Wash your hands with soap and water before you change your bandage (dressing). If there is no soap and water, use hand sanitizer. ? Change your bandage as told by your doctor.  Check your IV insertion site every day for signs of infection. Check for: ? More redness, swelling, or pain. ? More fluid or blood. ? Warmth. ? Pus or a bad smell. Contact a doctor if:  You have more redness, swelling, or pain around the IV insertion site.  You have more fluid or blood coming from the IV insertion site.  Your IV insertion site feels warm to the touch.  You have pus or a bad smell coming from the IV insertion site.  Your pee (urine) turns pink, red, or brown.  You feel weak after doing your normal activities. Get help right away if:  You have signs of a serious allergic or body defense (immune) system reaction, including: ? Itchiness. ? Hives. ? Trouble breathing. ? Anxiety. ? Pain in your chest or lower back. ? Fever, flushing, and chills. ? Fast pulse. ? Rash. ? Watery poop (diarrhea). ? Throwing up (vomiting). ? Dark pee. ? Serious headache. ? Dizziness. ? Stiff neck. ? Yellow color in your face or the white parts of your eyes (jaundice). Summary  After a blood transfusion, return to your normal activities as told by your doctor.  Every day, check for signs of infection where the IV tube was put into your vein.  Some  signs of infection are warm skin, more redness and pain, more fluid or blood, and pus or a bad smell where the needle went in.  Contact your doctor if you feel weak or have any unusual symptoms. This information is not intended to replace advice given to you by your health care provider. Make sure you discuss any questions you have with your health care provider. Document Released: 10/15/2014 Document Revised: 05/18/2016 Document Reviewed: 05/18/2016 Elsevier Interactive Patient Education  2019 [employer]  

## 2018-09-29 NOTE — Assessment & Plan Note (Signed)
She has chronic hypomagnesemia likely due to diuretic therapy and chronic diarrhea I recommend magnesium replacement therapy She will continue the same for now

## 2018-09-29 NOTE — Assessment & Plan Note (Signed)
She has mild progressive low protein secondary to recurrence of disease She will continue nutritional supplement as tolerated

## 2018-09-29 NOTE — Progress Notes (Signed)
[place] OFFICE PROGRESS NOTE  Patient Care Team: Asencion Noble, MD as PCP - General (Internal Medicine) Satira Sark, MD as PCP - Cardiology (Cardiology) Ahmed Prima, Fransisco Hertz, PA-C as Physician Assistant (Physician Assistant) Heath Lark, MD as Consulting Physician (Hematology and Oncology) Collene Gobble, MD as Consulting Physician (Pulmonary Disease)  ASSESSMENT & PLAN:  Left ovarian epithelial cancer [facility]) Unfortunately, she has developed significant anemia I will cancel her chemotherapy today and will use her treatment time for blood transfusion instead We will omit day 8 for this cycle and start cycle 2 next month  Anemia, chronic disease She has multifactorial anemia, due to anemia chronic disease and mild bleeding and side effects of chemotherapy We discussed some of the risks, benefits, and alternatives of blood transfusions. The patient is symptomatic from anemia and the hemoglobin level is critically low.  Some of the side-effects to be expected including risks of transfusion reactions, chills, infection, syndrome of volume overload and risk of hospitalization from various reasons and the patient is willing to proceed and went ahead to sign consent today. We will proceed with 1 unit of blood  COPD (chronic obstructive pulmonary disease) ([place]) She has mild wheezes on physical exam. She will continue her regular inhalers  Hypomagnesemia She has chronic hypomagnesemia likely due to diuretic therapy and chronic diarrhea I recommend magnesium replacement therapy She will continue the same for now  Protein-calorie malnutrition, moderate ([place]) She has mild progressive low protein secondary to recurrence of disease She will continue nutritional supplement as tolerated   Orders Placed This Encounter  Procedures  . Type and screen    Standing Status:   Future    Number of Occurrences:   1    Standing Expiration Date:   09/30/2019  . Prepare RBC     Standing Status:   Standing    Number of Occurrences:   1    Order Specific Question:   # of Units    Answer:   1 unit    Order Specific Question:   Transfusion Indications    Answer:   Symptomatic Anemia    Order Specific Question:   Special Requirements    Answer:   Irradiated    Order Specific Question:   If emergent release call blood bank    Answer:   [facility] [phone]  . Type and screen    Standing Status:   Future    Standing Expiration Date:   09/29/2019    INTERVAL HISTORY: Please see below for problem oriented charting. She returns for further follow-up and day 8 of chemotherapy Since last time I saw her, she has profound fatigue She denies worsening shortness of breath She has mild intermittent hemorrhoidal bleeding that is self-limiting She has some mild nausea but no vomiting from treatment No worsening peripheral neuropathy  SUMMARY OF ONCOLOGIC HISTORY: Oncology History   High grade serous ER 90%, PR 0% BRCA 1: no loss of expression MMR normal      Left ovarian epithelial cancer ([place])   02/18/2016 Tumor Marker    Patient's tumor was tested for the following markers: CA125 Results of the tumor marker test revealed 45    05/22/2016 Tumor Marker    Patient's tumor was tested for the following markers: CA125 Results of the tumor marker test revealed 53    05/22/2016 Imaging    Outside pelvic US showed 4.1 cm adnexa mass    06/24/2017 Imaging    Ct abdomen and pelvis:  1. Interim finding of moderate ascites within the abdomen and pelvis with additional finding of diffuse nodular infiltration of the omentum and anterior mesenteric fat, the appearance would be consistent with peritoneal carcinomatosis/metastatic disease. Increasing retroperitoneal and upper abdominal adenopathy. 2. Re- demonstrated 3.8 cm cyst in the right adnexa. Enlarging soft tissue density in the left adnexa now with possible cystic component posteriorly. In light of the above findings,  concern is for ovarian neoplasm. Correlation with pelvic ultrasound recommended. 3. Small right-sided pleural effusion, new since prior study 4. Stable hypodense splenic lesions since 2017.     06/25/2017 Imaging    US pelvis: 2.9 cm simple appearing cyst in the right ovary. Left ovary grossly unremarkable. Large volume ascites in the pelvis    06/30/2017 - 07/01/2017 Hospital Admission    She was admitted for evaluation of abdominal pain and ascites    07/01/2017 Pathology Results    PERITONEAL/ASCITIC FLUID(SPECIMEN 1 OF 1 COLLECTED 07/01/17): - POORLY DIFFERENTIATED CARCINOMA; SEE COMMENT Source Peritoneal/Ascitic Fluid, (specimen 1 of 1 collected 07/01/17) Gross Specimen: Received is/are 1000 cc's of brownish fluid. (BS:bs) Prepared: # Smears: 0 # Concentration Technique Slides (i.e. ThinPrep): 1 # Cell Block: 1 Additional Studies: Also received Hematology slide - [id number]. Comment The tumor cells are positive for cytokeratin 7 and Pax-8 but negative for cytokeratin 20, CDX-2, GATA-3, Napsin-A and TTF-1. Based on the immunoprofile a gynecology primary is favored    07/01/2017 Procedure    Successful ultrasound-guided diagnostic and therapeutic paracentesis yielding 2.5 liters of peritoneal fluid    07/07/2017 - 07/09/2017 Hospital Admission    She was admitted for management of malignant ascites    07/08/2017 Procedure    Successful ultrasound-guided therapeutic paracentesis yielding 2.7 liters liters of peritoneal fluid    07/12/2017 Procedure    Successful ultrasound-guided paracentesis yielding 1450 mL of peritoneal fluid    07/18/2017 - 07/24/2017 Hospital Admission    She was admitted for expedited treatment    07/18/2017 Tumor Marker    Patient's tumor was tested for the following markers: CA125 Results of the tumor marker test revealed 1941    07/19/2017 - 02/04/2018 Chemotherapy    The patient had 6 cycles of carboplatin & Taxol for chemotherapy treatment, followed by 3  more cycles of carboplatin only     07/19/2017 - 02/04/2018 Chemotherapy    The patient had carboplatin and taxol    08/06/2017 Procedure    Successful ultrasound-guided therapeutic paracentesis yielding 2.6 liters of peritoneal fluid.    08/09/2017 Tumor Marker    Patient's tumor was tested for the following markers: CA125 Results of the tumor marker test revealed 1665    08/15/2017 Tumor Marker    Patient's tumor was tested for the following markers: CA125 Results of the tumor marker test revealed 937.9    08/20/2017 Imaging    ECHO: Normal LV size with EF 60-65%. Normal RV size and systolic function. No significant valvular abnormalities.    09/18/2017 Imaging    Chest Impression:  1. No evidence thoracic metastasis. 2. Interval increase and RIGHT pleural effusion.  Abdomen / Pelvis Impression:  1. Interval decrease in intraperitoneal free fluid. 2. Interval decrease in omental nodularity in the LEFT ventral peritoneal space. 3. Interval decrease in nodularity associated with the LEFT ovary. 4. Cystic portion of the RIGHT ovary is increased mildly in size.    09/20/2017 Tumor Marker    Patient's tumor was tested for the following markers: CA125 Results of the tumor marker test revealed 347  10/14/2017 Tumor Marker    Patient's tumor was tested for the following markers: CA125 Results of the tumor marker test revealed 307.4    11/04/2017 Tumor Marker    Patient's tumor was tested for the following markers: CA125 Results of the tumor marker test revealed 262.5    11/28/2017 Imaging    1. Interval decrease in right pleural effusion with resolution of right atelectasis seen previously. 2. New small left pleural effusion, symmetric to the right. 3. No intraperitoneal free fluid on the current study. 4. Continued further decrease in left omental disease, appearing less confluent today than on the prior study. 5. Left ovary remains normal in appearance today and the right  adnexal cystic lesion is decreased in size compared to prior study. 6. 14 mm pancreatic cyst is unchanged. Continued attention on follow-up imaging recommended. 7. Aortic Atherosclerois (ICD10-170.0)    12/13/2017 Tumor Marker    Patient's tumor was tested for the following markers: CA125 Results of the tumor marker test revealed 197.7    01/03/2018 Tumor Marker    Patient's tumor was tested for the following markers: CA125 Results of the tumor marker test revealed 183.1    01/14/2018 Tumor Marker    Patient's tumor was tested for the following markers: CA125 Results of the tumor marker test revealed 177.4    02/04/2018 Tumor Marker    Patient's tumor was tested for the following markers: CA125 Results of the tumor marker test revealed 168.5    02/25/2018 Imaging    1. Omental carcinomatosis appears qualitatively stable to slightly decreased. Stable mild peritoneal thickening in the paracolic gutters. Stable right adnexal cyst. No ascites. No new or progressive metastatic disease in the abdomen or pelvis. 2. Small dependent right pleural effusion is increased. 3. Cystic pancreatic body lesion is decreased and now subcentimeter in size, suggesting a benign lesion. 4. Aortic Atherosclerosis (ICD10-I70.0).    03/03/2018 - 03/07/2018 Hospital Admission    She was hospitalized for GI bleed requiring blood transfusions. Xarelto was placed on hold    03/07/2018 PET scan    1. Persistent hazy omental interstitial nodularity but no hypermetabolism or discrete measurable nodules. No abdominal ascites. 2. No findings for metastatic disease involving the chest. 3. Moderate-sized right pleural effusion and small left pleural effusion.     03/20/2018 Pathology Results    1. Ovary and fallopian tube, right - OVARY AND FALLOPIAN TUBE INVOLVED BY SEROUS CARCINOMA. - PARATUBAL CYST. 2. Uterus +/- tubes/ovaries, neoplastic, cervix, left ovary and fallopian tube - LEFT OVARY: HIGH GRADE SEROUS CARCINOMA  WITH TREATMENT EFFECT, SPANNING 2.5 CM. CARCINOMA INVOLVES OVARIAN SURFACE. SEE ONCOLOGY TABLE. - LEFT FALLOPIAN TUBE: INVOLVED BY SEROUS CARCINOMA. - UTERUS: -ENDOMETRIUM: INACTIVE ENDOMETRIUM. NO HYPERPLASIA OR MALIGNANCY. -MYOMETRIUM: UNREMARKABLE. NO MALIGNANCY. -SEROSA: INVOLVED BY SEROUS CARCINOMA. - CERVIX: ENDOCERVICAL POLYP. NO MALIGNANCY. 3. Omentum, resection for tumor - INVOLVED BY SEROUS CARCINOMA. 4. Soft tissue, biopsy, mesenteric nodule - INVOLVED BY SEROUS CARCINOMA. Microscopic Comment 2. OVARY or FALLOPIAN TUBE or PRIMARY PERITONEUM: Procedure: Total hysterectomy and bilateral salpingo-oophorectomy. Omentectomy. Mesenteric lymph node biopsy. Specimen Integrity: Intact. Tumor Site: Left ovary. Ovarian Surface Involvement (required only if applicable): Present. Fallopian Tube Surface Involvement (required only if applicable): Present, bilateral. Tumor Size: 2.5 cm. Histologic Type: High grade serous carcinoma. Histologic Grade: High grade. Implants (required for advanced stage serous/seromucinous borderline tumors only): N/A. Other Tissue/ Organ Involvement: Bilateral fallopian tubes, right ovary, uterine serosa, omentum. Largest Extrapelvic Peritoneal Focus (required only if applicable): Microscopic, estimated 0.5 cm (omentum). Peritoneal/Ascitic Fluid:  Prior Positive ([id number]). Treatment Effect (required only for high-grade serous carcinomas): Present in left ovary. CRS2. Regional Lymph Nodes: No lymph nodes submitted/identified. Pathologic Stage Classification (pTNM, AJCC 8th Edition): ypT3b, ypNX Representative Tumor Block: 1A, 1B, 30F, 21F. Comment(s): The right ovary has only surface deposits with a large paratubal cyst. The left ovary has intraparenchymal tumor with associated treatment effect. Thus the tumor location is classified as a left ovarian primary.    03/20/2018 Surgery    Procedure(s) Performed:  1. Exploratory laparotomy with total hysterectomy  and bilateral salpingo-oophorectomy 2. Infragastic Omentectomy  3. Debulking to <1cm gross residual disease   Surgeon: Mart Piggs, MD  Specimens: Uterus Cervix, Bilateral tubes / ovaries and omentum. Mesenteric nodule.  Operative Findings: Debulked to gross residual disease <1cm; however there is miliary disease in multiple locations including the majority of the abdominal peritoneum (anterior abdominal wall, bilateral gutters), diaphragm (Right>left), majority of small bowel mesentary. Normal appendix. Normal small uterus. Right ovary with a cystic lesion ~3cm, some adhesive disease of right adnexa to rectum/sigmoid. Gross omental disease, which was resected with the omentectomy. Smooth liver surface, but again, diaphragmatic disease noted.       03/20/2018 Genetic Testing    Patient has genetic testing done for ER/PR. Results revealed patient has ER: 90%, PR 0%.     03/31/2018 Tumor Marker    Patient's tumor was tested for the following markers: CA125 Results of the tumor marker test revealed 215.8     Genetic Testing    Patient has genetic testing done for BRCA 1. Results revealed patient has the following: BRCA 1: no loss of expression.     Genetic Testing    Patient has genetic testing done for MMR . Results revealed patient has the following:  MMR: normal    03/31/2018 Genetic Testing    Patient has genetic testing done for BRCA1/2. Results revealed patient has no actionable mutations. She is found to have [place] genetic change of unknown significance    04/21/2018 Imaging    1. No definite findings of residual or recurrent metastatic disease in the abdomen or pelvis status post interval TAHBSO and omentectomy. Stable minimal thickening in the paracolic gutters without discrete peritoneal nodularity. 2. Trace free fluid in the pelvic cul-de-sac. 3. Stable small dependent right pleural effusion. 4. Subcentimeter pancreatic body cystic lesion is stable to  slightly decreased. 5. Aortic Atherosclerosis (ICD10-I70.0).    04/21/2018 Tumor Marker    Patient's tumor was tested for the following markers: CA125 Results of the tumor marker test revealed 187.3    04/23/2018 - 09/12/2018 Anti-estrogen oral therapy    She is placed on Femara    05/08/2018 Procedure    Successful ultrasound guided right thoracentesis yielding 800 mL of pleural fluid. Fluid cytology is negative for malignancy     05/28/2018 Tumor Marker    Patient's tumor was tested for the following markers: CA125 Results of the tumor marker test revealed 190    06/30/2018 Tumor Marker    Patient's tumor was tested for the following markers: CA125 Results of the tumor marker test revealed 148    07/23/2018 Imaging    Status post hysterectomy and bilateral salpingo-oophorectomy.  Very mild peritoneal thickening/nodularity, equivocal but worrisome for very mild peritoneal disease. Attention on follow-up is suggested.  Small right pleural effusion with indwelling pleural drain.    07/23/2018 Tumor Marker    Patient's tumor was tested for the following markers: CA125 Results of the tumor marker test revealed 215  09/11/2018 Tumor Marker    Patient's tumor was tested for the following markers: CA-125. Results of the tumor marker test revealed 1323    09/12/2018 Imaging    1. Mildly progressive ascites with some mild degree of nodularity most appreciable along the left adnexa, right paracolic gutter, right upper quadrant, compatible with peritoneal spread of tumor. 2. Small right pleural effusion with indwelling right pleural catheter again noted. Mild increase in atelectasis anteriorly at the right lung base. 3. Aortic Atherosclerosis (ICD10-I70.0). Coronary atherosclerosis. 4. Several tiny hypodense lesions in the spleen are technically nonspecific, but stable. 5. Air fluid level in the rectum compatible with diarrheal process. 6. Prominent bilateral hip arthropathy. Left hip  screw noted.    09/22/2018 -  Chemotherapy    The patient had carboplatin and gemzar     REVIEW OF SYSTEMS:   Constitutional: Denies fevers, chills or abnormal weight loss Eyes: Denies blurriness of vision Ears, nose, mouth, throat, and face: Denies mucositis or sore throat Cardiovascular: Denies palpitation, chest discomfort or lower extremity swelling Gastrointestinal:  Denies nausea, heartburn or change in bowel habits Skin: Denies abnormal skin rashes Lymphatics: Denies new lymphadenopathy or easy bruising Neurological:Denies numbness, tingling or new weaknesses Behavioral/Psych: Mood is stable, no new changes  All other systems were reviewed with the patient and are negative.  I have reviewed the past medical history, past surgical history, social history and family history with the patient and they are unchanged from previous note.  ALLERGIES:  is allergic to codeine.  MEDICATIONS:  Current Outpatient Medications  Medication Sig Dispense Refill  . diltiazem (TIAZAC) 120 MG 24 hr capsule Take 120 mg by mouth daily. At night time    . albuterol (PROAIR HFA) 108 (90 Base) MCG/ACT inhaler Inhale 2 puffs into the lungs every 6 (six) hours as needed for wheezing or shortness of breath. 1 Inhaler 3  . ALPRAZolam (XANAX) 0.25 MG tablet Take 1 tablet (0.25 mg total) by mouth at bedtime as needed for anxiety. 5 tablet 0  . amoxicillin (AMOXIL) 500 MG capsule TAKE 4 CAPSULES PO 1 HOUR PRIOR TO DENTAL APPOINTMENT  1  . azelastine (ASTELIN) 0.1 % nasal spray Place 2 sprays into both nostrils 2 (two) times daily as needed for allergies.     . Cholecalciferol (VITAMIN D) 2000 units CAPS Take 2,000 Units by mouth daily.     Marland Kitchen dicyclomine (BENTYL) 10 MG capsule Take 1 tab by mouth every morning. May take twice daily as needed. (Patient taking differently: Take 10 mg by mouth 2 (two) times daily. ) 60 capsule 6  . diltiazem (DILACOR XR) 180 MG 24 hr capsule Take 180 mg by mouth every morning.     . diphenhydrAMINE (BENADRYL) 25 mg capsule Take 25 mg by mouth daily as needed for itching.     . diphenoxylate-atropine (LOMOTIL) 2.5-0.025 MG tablet Take 1 tablet by mouth 4 (four) times daily. (Patient taking differently: Take 1 tablet by mouth 4 (four) times daily as needed for diarrhea or loose stools. ) 360 tablet 1  . DULoxetine (CYMBALTA) 60 MG capsule Take 30 mg by mouth daily.     . fluticasone (FLONASE) 50 MCG/ACT nasal spray Place 2 sprays into both nostrils 2 (two) times daily as needed (FOR NASAL CONGESTION.).     Marland Kitchen furosemide (LASIX) 20 MG tablet Take 2 tablets (40 mg total) by mouth daily. 180 tablet 3  . halobetasol (ULTRAVATE) 0.05 % cream Apply 1 application topically 2 (two) times daily as needed (psoriasis).     Marland Kitchen  levothyroxine (SYNTHROID, LEVOTHROID) 137 MCG tablet Take 137 mcg by mouth daily before breakfast. For thyroid therapy    . Magnesium 400 MG CAPS Take 400 mg by mouth daily.    . mirtazapine (REMERON) 15 MG tablet TAKE 1 TABLET(15 MG) BY MOUTH AT BEDTIME (Patient taking differently: Take 15 mg by mouth at bedtime. ) 30 tablet 11  . Oxycodone HCl 10 MG TABS Take 1 tablet (10 mg total) by mouth every 6 (six) hours as needed. 60 tablet 0  . polyvinyl alcohol (LIQUIFILM TEARS) 1.4 % ophthalmic solution Place 1 drop into both eyes 2 (two) times daily as needed for dry eyes.     . potassium chloride SA (K-DUR,KLOR-CON) 20 MEQ tablet Take 10 mEq by mouth daily.    . rivaroxaban (XARELTO) 20 MG TABS tablet Take 20 mg by mouth daily with supper.    Marland Kitchen STIOLTO RESPIMAT 2.5-2.5 MCG/ACT AERS INHALE 2 PUFFS INTO THE LUNGS DAILY 4 g 5  . Ustekinumab (STELARA [place]) Inject 1 Dose into the skin every 3 (three) months.     No current facility-administered medications for this visit.    Facility-Administered Medications Ordered in Other Visits  Medication Dose Route Frequency Provider Last Rate Last Dose  . 0.9 %  sodium chloride infusion   Intravenous Once Alvy Bimler, , MD      .  heparin lock flush 100 unit/mL  500 Units Intracatheter Once PRN Alvy Bimler, , MD      . heparin lock flush 100 unit/mL  250 Units Intracatheter PRN Alvy Bimler, , MD      . heparin lock flush 100 unit/mL  500 Units Intracatheter Daily PRN Alvy Bimler, , MD      . sodium chloride flush (NS) 0.9 % injection 10 mL  10 mL Intracatheter PRN , , MD      . sodium chloride flush (NS) 0.9 % injection 10 mL  10 mL Intracatheter PRN , , MD      . sodium chloride flush (NS) 0.9 % injection 3 mL  3 mL Intracatheter PRN Alvy Bimler, , MD        PHYSICAL EXAMINATION: ECOG PERFORMANCE STATUS: 2 - Symptomatic, <50% confined to bed  Vitals:   09/29/18 1211  BP: (!) 117/48  Pulse: (!) 101  Resp: 18  Temp: 98 F (36.7 C)  SpO2: 93%   Filed Weights   09/29/18 1211  Weight: 171 lb 6.4 oz (77.7 kg)    GENERAL:alert, no distress and comfortable SKIN: skin color is pale, texture, turgor are normal, no rashes or significant lesions EYES: normal, Conjunctiva are pale and non-injected, sclera clear OROPHARYNX:no exudate, no erythema and lips, buccal mucosa, and tongue normal  NECK: supple, thyroid normal size, non-tender, without nodularity LYMPH:  no palpable lymphadenopathy in the cervical, axillary or inguinal LUNGS: She has bilateral expiratory wheezes with normal breathing effort HEART: regular rate & rhythm and no murmurs and no lower extremity edema ABDOMEN:abdomen soft, non-tender and normal bowel sounds Musculoskeletal:no cyanosis of digits and no clubbing  NEURO: alert & oriented x 3 with fluent speech, no focal motor/sensory deficits  LABORATORY DATA:  I have reviewed the data as listed    Component Value Date/Time   NA 137 09/29/2018 1138   NA 135 (L) 09/24/2017 0945   K 4.0 09/29/2018 1138   K 3.7 09/24/2017 0945   CL 98 09/29/2018 1138   CO2 30 09/29/2018 1138   CO2 30 (H) 09/24/2017 0945   GLUCOSE 97 09/29/2018 1138   GLUCOSE 101  09/24/2017 0945   BUN 17 09/29/2018  1138   BUN 15.3 09/24/2017 0945   CREATININE 0.99 09/29/2018 1138   CREATININE 1.00 03/13/2018 0940   CREATININE 0.8 09/24/2017 0945   CALCIUM 8.7 (L) 09/29/2018 1138   CALCIUM 9.1 09/24/2017 0945   PROT 6.6 09/29/2018 1138   PROT 6.6 09/24/2017 0945   ALBUMIN 3.0 (L) 09/29/2018 1138   ALBUMIN 3.3 (L) 09/24/2017 0945   AST 21 09/29/2018 1138   AST 14 03/13/2018 0940   AST 16 09/24/2017 0945   ALT 15 09/29/2018 1138   ALT 7 03/13/2018 0940   ALT 12 09/24/2017 0945   ALKPHOS 58 09/29/2018 1138   ALKPHOS 52 09/24/2017 0945   BILITOT <0.2 (L) 09/29/2018 1138   BILITOT 0.3 03/13/2018 0940   BILITOT 1.13 09/24/2017 0945   GFRNONAA 53 (L) 09/29/2018 1138   GFRNONAA 51 (L) 03/13/2018 0940   GFRAA >60 09/29/2018 1138   GFRAA 59 (L) 03/13/2018 0940    No results found for: SPEP, UPEP  Lab Results  Component Value Date   WBC 5.8 09/29/2018   NEUTROABS 4.0 09/29/2018   HGB 7.1 (L) 09/29/2018   HCT 23.3 (L) 09/29/2018   MCV 82.9 09/29/2018   PLT 180 09/29/2018      Chemistry      Component Value Date/Time   NA 137 09/29/2018 1138   NA 135 (L) 09/24/2017 0945   K 4.0 09/29/2018 1138   K 3.7 09/24/2017 0945   CL 98 09/29/2018 1138   CO2 30 09/29/2018 1138   CO2 30 (H) 09/24/2017 0945   BUN 17 09/29/2018 1138   BUN 15.3 09/24/2017 0945   CREATININE 0.99 09/29/2018 1138   CREATININE 1.00 03/13/2018 0940   CREATININE 0.8 09/24/2017 0945      Component Value Date/Time   CALCIUM 8.7 (L) 09/29/2018 1138   CALCIUM 9.1 09/24/2017 0945   ALKPHOS 58 09/29/2018 1138   ALKPHOS 52 09/24/2017 0945   AST 21 09/29/2018 1138   AST 14 03/13/2018 0940   AST 16 09/24/2017 0945   ALT 15 09/29/2018 1138   ALT 7 03/13/2018 0940   ALT 12 09/24/2017 0945   BILITOT <0.2 (L) 09/29/2018 1138   BILITOT 0.3 03/13/2018 0940   BILITOT 1.13 09/24/2017 0945       RADIOGRAPHIC STUDIES: I have personally reviewed the radiological images as listed and agreed with the findings in the  report. Dg Chest 2 View  Result Date: 09/11/2018 CLINICAL DATA:  Shortness of breath. Pleural effusion. EXAM: CHEST - 2 VIEW COMPARISON:  08/14/2018 FINDINGS: Right jugular Port-A-Cath remains in place terminating over the lower SVC, unchanged. A right-sided tunneled pleural catheter catheter is unchanged. The cardiomediastinal silhouette is unchanged with normal heart size. Aortic atherosclerosis is noted. The lungs remain hyperinflated with minimal scarring or atelectasis in the lung bases. No acute airspace consolidation or edema is identified. There is likely a trace right apical pneumothorax, and there is at most a trace right pleural effusion. No acute osseous abnormality is identified. IMPRESSION: Trace right apical pneumothorax and at most a trace right pleural effusion with right pleural catheter in place. No evidence of acute airspace disease. Electronically Signed   By: Logan Bores M.D.   On: 09/11/2018 14:35   Ct Abdomen Pelvis W Contrast  Result Date: 09/12/2018 CLINICAL DATA:  Ovarian cancer restaging EXAM: CT ABDOMEN AND PELVIS WITH CONTRAST TECHNIQUE: Multidetector CT imaging of the abdomen and pelvis was performed using the standard protocol following bolus administration  of intravenous contrast. CONTRAST:  116m OMNIPAQUE IOHEXOL 300 MG/ML  SOLN COMPARISON:  Multiple exams, including 07/22/2018 FINDINGS: Lower chest: Small right pleural effusion with indwelling pleural catheter subsegmental atelectasis anteriorly right lower lobe and in the right middle lobe. Right coronary artery atherosclerotic calcification. Hepatobiliary: Cholecystectomy. No liver mass observed. Common bile duct 10 mm in diameter, stable. Pancreas: Unremarkable Spleen: Stable but nonspecific 0.7 by 0.6 cm hypodense lesion medially in the spleen on image 14/2. At least 3 additional smaller hypodensities in the spleen are likewise stable nonspecific. Adrenals/Urinary Tract: Unremarkable Stomach/Bowel: Air-fluid level the  rectum compatible with diarrheal process. Scattered normal sized loops of small bowel. Vascular/Lymphatic: Aortoiliac atherosclerotic vascular disease. Reproductive: Prior hysterectomy and bilateral salpingo-oophorectomy. Other: Scattered mild ascites. This is increased from prior. Nodularity along the paracolic gutters and along some of the ascites, for example in the right upper quadrant adjacent to the proximal transverse colon on images 27 through 22 of series 5, compatible with peritoneal tumor. This is felt overall be mildly increased from prior. For example a small tumor deposit along the left adnexa measures 8 mm in thickness on image 66/2, previously 5 mm. Musculoskeletal: Left hip screw. 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 Stable superior endplate compression at L5 with grade 1 degenerative retrolisthesis at L4-5. Lower thoracic lumbar spondylosis. Small lax umbilical hernia containing adipose tissue and a margin of the colon. IMPRESSION: 1. Mildly progressive ascites with some mild degree of nodularity most appreciable along the left adnexa, right paracolic gutter, right upper quadrant, compatible with peritoneal spread of tumor. 2. Small right pleural effusion with indwelling right pleural catheter again noted. Mild increase in atelectasis anteriorly at the right lung base. 3.  Aortic Atherosclerosis (ICD10-I70.0).  Coronary atherosclerosis. 4. Several tiny hypodense lesions in the spleen are technically nonspecific, but stable. 5. Air fluid level in the rectum compatible with diarrheal process. 6. Prominent bilateral hip arthropathy.  Left hip screw noted. Electronically Signed   By: WVan ClinesM.D.   On: 09/12/2018 07:13   [place]domen Limited  Result Date: 09/05/2018 CLINICAL DATA:  Evaluate for ascites. EXAM: ULTRASOUND ABDOMEN LIMITED COMPARISON:  CT 07/22/2018. FINDINGS: No evidence of mass lesion. Trace amount of free fluid noted in the pelvis. No large volume of ascites noted  IMPRESSION: Trace amount of free fluid in the pelvis. No large volume of ascites noted. Electronically Signed   By: TMarcello Moores Register   On: 09/05/2018 10:17    All questions were answered. The patient knows to call the clinic with any problems, questions or concerns. No barriers to learning was detected.  I spent 25 minutes counseling the patient face to face. The total time spent in the appointment was 30 minutes and more than 50% was on counseling and review of test results  NHeath Lark MD 09/29/2018 1:49 PM

## 2018-09-29 NOTE — Assessment & Plan Note (Signed)
Unfortunately, she has developed significant anemia I will cancel her chemotherapy today and will use her treatment time for blood transfusion instead We will omit day 8 for this cycle and start cycle 2 next month

## 2018-09-29 NOTE — Telephone Encounter (Signed)
Gave avs and calendar ° °

## 2018-09-29 NOTE — Assessment & Plan Note (Signed)
She has multifactorial anemia, due to anemia chronic disease and mild bleeding and side effects of chemotherapy We discussed some of the risks, benefits, and alternatives of blood transfusions. The patient is symptomatic from anemia and the hemoglobin level is critically low.  Some of the side-effects to be expected including risks of transfusion reactions, chills, infection, syndrome of volume overload and risk of hospitalization from various reasons and the patient is willing to proceed and went ahead to sign consent today. We will proceed with 1 unit of blood

## 2018-09-30 ENCOUNTER — Encounter (HOSPITAL_COMMUNITY): Payer: Medicare Other

## 2018-09-30 LAB — TYPE AND SCREEN
ABO/RH(D): O POS
Antibody Screen: NEGATIVE
Unit division: 0

## 2018-09-30 LAB — BPAM RBC
Blood Product Expiration Date: 202001202359
ISSUE DATE / TIME: 201912231343
Unit Type and Rh: 5100

## 2018-09-30 LAB — CA 125: Cancer Antigen (CA) 125: 1219 U/mL — ABNORMAL HIGH (ref 0.0–38.1)

## 2018-10-02 ENCOUNTER — Encounter (HOSPITAL_COMMUNITY): Payer: Medicare Other

## 2018-10-03 MED ORDER — DIPHENHYDRAMINE HCL 25 MG PO CAPS
ORAL_CAPSULE | ORAL | Status: AC
  Filled 2018-10-03: qty 2

## 2018-10-03 MED ORDER — ACETAMINOPHEN 325 MG PO TABS
ORAL_TABLET | ORAL | Status: AC
  Filled 2018-10-03: qty 2

## 2018-10-04 ENCOUNTER — Other Ambulatory Visit: Payer: Self-pay | Admitting: Cardiology

## 2018-10-07 ENCOUNTER — Encounter (HOSPITAL_COMMUNITY): Payer: Medicare Other

## 2018-10-09 ENCOUNTER — Telehealth: Payer: Self-pay | Admitting: *Deleted

## 2018-10-09 ENCOUNTER — Encounter (HOSPITAL_COMMUNITY): Payer: Medicare Other

## 2018-10-09 ENCOUNTER — Other Ambulatory Visit: Payer: Self-pay | Admitting: Hematology and Oncology

## 2018-10-09 MED ORDER — DULOXETINE HCL 60 MG PO CPEP: 60.0000 mg | ORAL_CAPSULE | Freq: Every day | ORAL | 3 refills | Status: DC

## 2018-10-09 NOTE — Telephone Encounter (Signed)
Daughter advised refill has been sent

## 2018-10-09 NOTE — Telephone Encounter (Signed)
Patients sister called and left message asking for a refill of the Cymbalta 60 mg tablets sent to [facility] in [place].

## 2018-10-09 NOTE — Telephone Encounter (Signed)
Done

## 2018-10-13 ENCOUNTER — Inpatient Hospital Stay: Payer: Medicare Other

## 2018-10-13 ENCOUNTER — Other Ambulatory Visit: Payer: Self-pay | Admitting: Hematology and Oncology

## 2018-10-13 ENCOUNTER — Telehealth: Payer: Self-pay | Admitting: *Deleted

## 2018-10-13 ENCOUNTER — Telehealth: Payer: Self-pay

## 2018-10-13 ENCOUNTER — Other Ambulatory Visit: Payer: Self-pay | Admitting: Cardiology

## 2018-10-13 ENCOUNTER — Inpatient Hospital Stay (HOSPITAL_BASED_OUTPATIENT_CLINIC_OR_DEPARTMENT_OTHER): Payer: Medicare Other | Admitting: Hematology and Oncology

## 2018-10-13 ENCOUNTER — Inpatient Hospital Stay: Payer: Medicare Other | Attending: Hematology and Oncology

## 2018-10-13 VITALS — BP 101/53 | HR 63 | Temp 98.6°F | Resp 18

## 2018-10-13 DIAGNOSIS — M549 Dorsalgia, unspecified: Secondary | ICD-10-CM | POA: Insufficient documentation

## 2018-10-13 DIAGNOSIS — D61818 Other pancytopenia: Secondary | ICD-10-CM | POA: Insufficient documentation

## 2018-10-13 DIAGNOSIS — D531 Other megaloblastic anemias, not elsewhere classified: Secondary | ICD-10-CM | POA: Diagnosis not present

## 2018-10-13 DIAGNOSIS — D638 Anemia in other chronic diseases classified elsewhere: Secondary | ICD-10-CM

## 2018-10-13 DIAGNOSIS — I482 Chronic atrial fibrillation, unspecified: Secondary | ICD-10-CM

## 2018-10-13 DIAGNOSIS — C562 Malignant neoplasm of left ovary: Secondary | ICD-10-CM

## 2018-10-13 DIAGNOSIS — Z5111 Encounter for antineoplastic chemotherapy: Secondary | ICD-10-CM | POA: Diagnosis present

## 2018-10-13 DIAGNOSIS — Z79899 Other long term (current) drug therapy: Secondary | ICD-10-CM | POA: Insufficient documentation

## 2018-10-13 DIAGNOSIS — G893 Neoplasm related pain (acute) (chronic): Secondary | ICD-10-CM

## 2018-10-13 DIAGNOSIS — N183 Chronic kidney disease, stage 3 unspecified: Secondary | ICD-10-CM

## 2018-10-13 LAB — CBC WITH DIFFERENTIAL/PLATELET
Abs Immature Granulocytes: 0.03 10*3/uL (ref 0.00–0.07)
Basophils Absolute: 0 10*3/uL (ref 0.0–0.1)
Basophils Relative: 0 %
Eosinophils Absolute: 0.1 10*3/uL (ref 0.0–0.5)
Eosinophils Relative: 1 %
HCT: 17.9 % — ABNORMAL LOW (ref 36.0–46.0)
HEMOGLOBIN: 5.3 g/dL — AB (ref 12.0–15.0)
Immature Granulocytes: 1 %
Lymphocytes Relative: 21 %
Lymphs Abs: 0.9 10*3/uL (ref 0.7–4.0)
MCH: 25 pg — ABNORMAL LOW (ref 26.0–34.0)
MCHC: 29.6 g/dL — ABNORMAL LOW (ref 30.0–36.0)
MCV: 84.4 fL (ref 80.0–100.0)
MONOS PCT: 14 %
Monocytes Absolute: 0.6 10*3/uL (ref 0.1–1.0)
Neutro Abs: 2.6 10*3/uL (ref 1.7–7.7)
Neutrophils Relative %: 63 %
Platelets: 191 10*3/uL (ref 150–400)
RBC: 2.12 MIL/uL — ABNORMAL LOW (ref 3.87–5.11)
RDW: 14.6 % (ref 11.5–15.5)
WBC: 4.2 10*3/uL (ref 4.0–10.5)
nRBC: 0 % (ref 0.0–0.2)

## 2018-10-13 LAB — COMPREHENSIVE METABOLIC PANEL
ALK PHOS: 57 U/L (ref 38–126)
ALT: 10 U/L (ref 0–44)
AST: 15 U/L (ref 15–41)
Albumin: 2.9 g/dL — ABNORMAL LOW (ref 3.5–5.0)
Anion gap: 8 (ref 5–15)
BUN: 19 mg/dL (ref 8–23)
CO2: 33 mmol/L — ABNORMAL HIGH (ref 22–32)
Calcium: 8.6 mg/dL — ABNORMAL LOW (ref 8.9–10.3)
Chloride: 95 mmol/L — ABNORMAL LOW (ref 98–111)
Creatinine, Ser: 1.18 mg/dL — ABNORMAL HIGH (ref 0.44–1.00)
GFR calc Af Amer: 49 mL/min — ABNORMAL LOW (ref >60)
GFR calc non Af Amer: 43 mL/min — ABNORMAL LOW (ref >60)
Glucose, Bld: 126 mg/dL — ABNORMAL HIGH (ref 70–99)
Potassium: 3.6 mmol/L (ref 3.5–5.1)
Sodium: 136 mmol/L (ref 135–145)
Total Protein: 6.4 g/dL — ABNORMAL LOW (ref 6.5–8.1)

## 2018-10-13 LAB — PREPARE RBC (CROSSMATCH)

## 2018-10-13 LAB — MAGNESIUM: Magnesium: 1.5 mg/dL — ABNORMAL LOW (ref 1.7–2.4)

## 2018-10-13 LAB — SAMPLE TO BLOOD BANK

## 2018-10-13 MED ORDER — ACETAMINOPHEN 325 MG PO TABS
ORAL_TABLET | ORAL | Status: AC
  Filled 2018-10-13: qty 2

## 2018-10-13 MED ORDER — OXYCODONE HCL 10 MG PO TABS: 10.0000 mg | ORAL_TABLET | Freq: Four times a day (QID) | ORAL | 0 refills | Status: DC | PRN

## 2018-10-13 MED ORDER — SODIUM CHLORIDE 0.9% FLUSH
10.0000 mL | Freq: Once | INTRAVENOUS | Status: AC
  Administered 2018-10-13: 10 mL
  Filled 2018-10-13: qty 10

## 2018-10-13 MED ORDER — HEPARIN SOD (PORK) LOCK FLUSH 100 UNIT/ML IV SOLN
250.0000 [IU] | INTRAVENOUS | Status: DC | PRN
  Filled 2018-10-13: qty 5

## 2018-10-13 MED ORDER — ACETAMINOPHEN 325 MG PO TABS
650.0000 mg | ORAL_TABLET | Freq: Once | ORAL | Status: AC
  Administered 2018-10-13: 650 mg via ORAL

## 2018-10-13 MED ORDER — DIPHENHYDRAMINE HCL 25 MG PO CAPS
ORAL_CAPSULE | ORAL | Status: AC
  Filled 2018-10-13: qty 2

## 2018-10-13 MED ORDER — SODIUM CHLORIDE 0.9% FLUSH
3.0000 mL | INTRAVENOUS | Status: DC | PRN
  Filled 2018-10-13: qty 10

## 2018-10-13 MED ORDER — HEPARIN SOD (PORK) LOCK FLUSH 100 UNIT/ML IV SOLN
500.0000 [IU] | Freq: Once | INTRAVENOUS | Status: AC
  Administered 2018-10-13: 500 [IU]
  Filled 2018-10-13: qty 5

## 2018-10-13 MED ORDER — DIPHENHYDRAMINE HCL 25 MG PO CAPS
25.0000 mg | ORAL_CAPSULE | Freq: Once | ORAL | Status: AC
  Administered 2018-10-13: 25 mg via ORAL

## 2018-10-13 MED ORDER — FUROSEMIDE 40 MG PO TABS: 40.0000 mg | ORAL_TABLET | Freq: Every day | ORAL | 3 refills | Status: DC

## 2018-10-13 MED ORDER — SODIUM CHLORIDE 0.9% IV SOLUTION
250.0000 mL | Freq: Once | INTRAVENOUS | Status: AC
  Administered 2018-10-13: 250 mL via INTRAVENOUS
  Filled 2018-10-13: qty 250

## 2018-10-13 NOTE — Telephone Encounter (Signed)
Daughter, [name] called and left a message. Her Mom has appt later today and is complaining of a lot of pain. She has taken 1 Oxycodone at 6 am and still complaining of a lot of pain in neck and buttocks.  Called back and per Dr. Alvy Bimler. Instructed to take one Oxycodone now along with 650 mg tylenol. Offered a earlier appt today. Daughter declined earlier appt and verbalized understanding.

## 2018-10-13 NOTE — Patient Instructions (Signed)
Blood Transfusion, Adult, Care After This sheet gives you information about how to care for yourself after your procedure. Your doctor may also give you more specific instructions. If you have problems or questions, contact your doctor. Follow these instructions at home:   Take over-the-counter and prescription medicines only as told by your doctor.  Go back to your normal activities as told by your doctor.  Follow instructions from your doctor about how to take care of the area where an IV tube was put into your vein (insertion site). Make sure you: ? Wash your hands with soap and water before you change your bandage (dressing). If there is no soap and water, use hand sanitizer. ? Change your bandage as told by your doctor.  Check your IV insertion site every day for signs of infection. Check for: ? More redness, swelling, or pain. ? More fluid or blood. ? Warmth. ? Pus or a bad smell. Contact a doctor if:  You have more redness, swelling, or pain around the IV insertion site.  You have more fluid or blood coming from the IV insertion site.  Your IV insertion site feels warm to the touch.  You have pus or a bad smell coming from the IV insertion site.  Your pee (urine) turns pink, red, or brown.  You feel weak after doing your normal activities. Get help right away if:  You have signs of a serious allergic or body defense (immune) system reaction, including: ? Itchiness. ? Hives. ? Trouble breathing. ? Anxiety. ? Pain in your chest or lower back. ? Fever, flushing, and chills. ? Fast pulse. ? Rash. ? Watery poop (diarrhea). ? Throwing up (vomiting). ? Dark pee. ? Serious headache. ? Dizziness. ? Stiff neck. ? Yellow color in your face or the white parts of your eyes (jaundice). Summary  After a blood transfusion, return to your normal activities as told by your doctor.  Every day, check for signs of infection where the IV tube was put into your vein.  Some  signs of infection are warm skin, more redness and pain, more fluid or blood, and pus or a bad smell where the needle went in.  Contact your doctor if you feel weak or have any unusual symptoms. This information is not intended to replace advice given to you by your health care provider. Make sure you discuss any questions you have with your health care provider. Document Released: 10/15/2014 Document Revised: 05/18/2016 Document Reviewed: 05/18/2016 Elsevier Interactive Patient Education  2019 [employer]  

## 2018-10-13 NOTE — Telephone Encounter (Signed)
escribed refill 

## 2018-10-13 NOTE — Telephone Encounter (Signed)
Needing refill on furosemide (LASIX) 20 MG tablet [[id number]] Sent to [facility] on [address] Pt has been taking 69m, Dr. MDomenic Politechanged dose. They need Rx sent in for 486m

## 2018-10-13 NOTE — Telephone Encounter (Signed)
[facility] called report of HGB = 5.3.  Scheduled today for provider F/U at 1:00 pm.

## 2018-10-14 ENCOUNTER — Encounter: Payer: Self-pay | Admitting: Hematology and Oncology

## 2018-10-14 ENCOUNTER — Ambulatory Visit: Payer: Self-pay | Admitting: Obstetrics

## 2018-10-14 ENCOUNTER — Inpatient Hospital Stay: Payer: Medicare Other

## 2018-10-14 ENCOUNTER — Telehealth: Payer: Self-pay | Admitting: Hematology and Oncology

## 2018-10-14 ENCOUNTER — Encounter (HOSPITAL_COMMUNITY): Payer: Medicare Other

## 2018-10-14 DIAGNOSIS — N183 Chronic kidney disease, stage 3 unspecified: Secondary | ICD-10-CM | POA: Insufficient documentation

## 2018-10-14 DIAGNOSIS — I482 Chronic atrial fibrillation, unspecified: Secondary | ICD-10-CM | POA: Insufficient documentation

## 2018-10-14 LAB — BPAM RBC
Blood Product Expiration Date: 202002012359
Blood Product Expiration Date: 202002012359
ISSUE DATE / TIME: 202001061422
ISSUE DATE / TIME: 202001061606
Unit Type and Rh: 5100
Unit Type and Rh: 5100

## 2018-10-14 LAB — TYPE AND SCREEN
ABO/RH(D): O POS
Antibody Screen: NEGATIVE
Unit division: 0
Unit division: 0

## 2018-10-14 NOTE — Assessment & Plan Note (Signed)
Unfortunately, she tolerated treatment very poorly with severe pancytopenia from side effects of chemo Her treatment is placed on hold I plan to modify the dose of her chemotherapy further to every other week treatment along with dose reduction She will receive blood transfusion today

## 2018-10-14 NOTE — Progress Notes (Signed)
[facility] OFF[facility] PROGRESS NOTE  Patient Care Team: Asencion Noble, MD as PCP - General (Internal Medicine) Satira Sark, MD as PCP - Cardiology (Cardiology) Ahmed Prima, Fransisco Hertz, PA-C as Physician Assistant (Physician Assistant) Heath Lark, MD as Consulting Physician (Hematology and Oncology) Collene Gobble, MD as Consulting Physician (Pulmonary Disease)  ASSESSMENT & PLAN:  Left ovarian epithelial cancer [facility]) Unfortunately, she tolerated treatment very poorly with severe pancytopenia from side effects of chemo Her treatment is placed on hold I plan to modify the dose of her chemotherapy further to every other week treatment along with dose reduction She will receive blood transfusion today  Anemia, chronic disease She has multifactorial anemia, likely anemia of chronic disease due to chronic renal failure, recent bleeding and side effects of chemotherapy.  We will put her treatment on hold  We discussed some of the risks, benefits, and alternatives of blood transfusions. The patient is symptomatic from anemia and the hemoglobin level is critically low.  Some of the side-effects to be expected including risks of transfusion reactions, chills, infection, syndrome of volume overload and risk of hospitalization from various reasons and the patient is willing to proceed and went ahead to sign consent today.   Cancer associated pain She has mild worsening bone pain secondary to bone marrow expansion from severe anemia I recommend her to continue on pain medication along with Tylenol as needed  CKD (chronic kidney disease) stage 3, GFR 30-59 ml/min (HCC) She has stable chronic kidney disease We will monitor carefully while she is on treatment  Chronic atrial fibrillation She has chronic atrial fibrillation and on chronic anticoagulation therapy I suspect she might have mild GI bleed We have to balance about the risk of bleeding versus the risk of not being on  anticoagulation therapy For now, she will continue on chronic anticoagulation therapy and we will continue aggressive transfusion support as needed   Orders Placed This Encounter  Procedures  . Prepare RBC    Standing Status:   Standing    Number of Occurrences:   1    Order Specific Question:   # of Units    Answer:   2 units    Order Specific Question:   Transfusion Indications    Answer:   Symptomatic Anemia    Order Specific Question:   If emergent release call blood bank    Answer:   Not emergent release  . Type and screen         Standing Status:   Future    Standing Expiration Date:   10/14/2019  . Type and screen    INTERVAL HISTORY: Please see below for problem oriented charting. She returns to receive chemotherapy Since the last time I saw her, she felt bad She has significant worsening back pain She only took on average 2 pain medicine per day over the last few days She noticed some passage of dark stool recently Denies epistaxis or hematuria She has stable chronic shortness of breath Denies recent nausea No worsening neuropathy while on treatment  SUMMARY OF ONCOLOGIC HISTORY: Oncology History   High grade serous ER 90%, PR 0% BRCA 1: no loss of expression MMR normal      Left ovarian epithelial cancer ([place])   02/18/2016 Tumor Marker    Patient's tumor was tested for the following markers: CA125 Results of the tumor marker test revealed 45    05/22/2016 Tumor Marker    Patient's tumor was tested for the following markers:  CA125 Results of the tumor marker test revealed 53    05/22/2016 Imaging    Outside pelvic US showed 4.1 cm adnexa mass    06/24/2017 Imaging    Ct abdomen and pelvis:  1. Interim finding of moderate ascites within the abdomen and pelvis with additional finding of diffuse nodular infiltration of the omentum and anterior mesenteric fat, the appearance would be consistent with peritoneal carcinomatosis/metastatic disease. Increasing  retroperitoneal and upper abdominal adenopathy. 2. Re- demonstrated 3.8 cm cyst in the right adnexa. Enlarging soft tissue density in the left adnexa now with possible cystic component posteriorly. In light of the above findings, concern is for ovarian neoplasm. Correlation with pelvic ultrasound recommended. 3. Small right-sided pleural effusion, new since prior study 4. Stable hypodense splenic lesions since 2017.     06/25/2017 Imaging    US pelvis: 2.9 cm simple appearing cyst in the right ovary. Left ovary grossly unremarkable. Large volume ascites in the pelvis    06/30/2017 - 07/01/2017 Hospital Admission    She was admitted for evaluation of abdominal pain and ascites    07/01/2017 Pathology Results    PERITONEAL/ASCITIC FLUID(SPECIMEN 1 OF 1 COLLECTED 07/01/17): - POORLY DIFFERENTIATED CARCINOMA; SEE COMMENT Source Peritoneal/Ascitic Fluid, (specimen 1 of 1 collected 07/01/17) Gross Specimen: Received is/are 1000 cc's of brownish fluid. (BS:bs) Prepared: # Smears: 0 # Concentration Technique Slides (i.e. ThinPrep): 1 # Cell Block: 1 Additional Studies: Also received Hematology slide - [id number]. Comment The tumor cells are positive for cytokeratin 7 and Pax-8 but negative for cytokeratin 20, CDX-2, GATA-3, Napsin-A and TTF-1. Based on the immunoprofile a gynecology primary is favored    07/01/2017 Procedure    Successful ultrasound-guided diagnostic and therapeutic paracentesis yielding 2.5 liters of peritoneal fluid    07/07/2017 - 07/09/2017 Hospital Admission    She was admitted for management of malignant ascites    07/08/2017 Procedure    Successful ultrasound-guided therapeutic paracentesis yielding 2.7 liters liters of peritoneal fluid    07/12/2017 Procedure    Successful ultrasound-guided paracentesis yielding 1450 mL of peritoneal fluid    07/18/2017 - 07/24/2017 Hospital Admission    She was admitted for expedited treatment    07/18/2017 Tumor Marker    Patient's  tumor was tested for the following markers: CA125 Results of the tumor marker test revealed 1941    07/19/2017 - 02/04/2018 Chemotherapy    The patient had 6 cycles of carboplatin & Taxol for chemotherapy treatment, followed by 3 more cycles of carboplatin only     07/19/2017 - 02/04/2018 Chemotherapy    The patient had carboplatin and taxol    08/06/2017 Procedure    Successful ultrasound-guided therapeutic paracentesis yielding 2.6 liters of peritoneal fluid.    08/09/2017 Tumor Marker    Patient's tumor was tested for the following markers: CA125 Results of the tumor marker test revealed 1665    08/15/2017 Tumor Marker    Patient's tumor was tested for the following markers: CA125 Results of the tumor marker test revealed 937.9    08/20/2017 Imaging    ECHO: Normal LV size with EF 60-65%. Normal RV size and systolic function. No significant valvular abnormalities.    09/18/2017 Imaging    Chest Impression:  1. No evidence thoracic metastasis. 2. Interval increase and RIGHT pleural effusion.  Abdomen / Pelvis Impression:  1. Interval decrease in intraperitoneal free fluid. 2. Interval decrease in omental nodularity in the LEFT ventral peritoneal space. 3. Interval decrease in nodularity associated with the LEFT ovary. 4.  Cystic portion of the RIGHT ovary is increased mildly in size.    09/20/2017 Tumor Marker    Patient's tumor was tested for the following markers: CA125 Results of the tumor marker test revealed 347    10/14/2017 Tumor Marker    Patient's tumor was tested for the following markers: CA125 Results of the tumor marker test revealed 307.4    11/04/2017 Tumor Marker    Patient's tumor was tested for the following markers: CA125 Results of the tumor marker test revealed 262.5    11/28/2017 Imaging    1. Interval decrease in right pleural effusion with resolution of right atelectasis seen previously. 2. New small left pleural effusion, symmetric to the  right. 3. No intraperitoneal free fluid on the current study. 4. Continued further decrease in left omental disease, appearing less confluent today than on the prior study. 5. Left ovary remains normal in appearance today and the right adnexal cystic lesion is decreased in size compared to prior study. 6. 14 mm pancreatic cyst is unchanged. Continued attention on follow-up imaging recommended. 7. Aortic Atherosclerois (ICD10-170.0)    12/13/2017 Tumor Marker    Patient's tumor was tested for the following markers: CA125 Results of the tumor marker test revealed 197.7    01/03/2018 Tumor Marker    Patient's tumor was tested for the following markers: CA125 Results of the tumor marker test revealed 183.1    01/14/2018 Tumor Marker    Patient's tumor was tested for the following markers: CA125 Results of the tumor marker test revealed 177.4    02/04/2018 Tumor Marker    Patient's tumor was tested for the following markers: CA125 Results of the tumor marker test revealed 168.5    02/25/2018 Imaging    1. Omental carcinomatosis appears qualitatively stable to slightly decreased. Stable mild peritoneal thickening in the paracolic gutters. Stable right adnexal cyst. No ascites. No new or progressive metastatic disease in the abdomen or pelvis. 2. Small dependent right pleural effusion is increased. 3. Cystic pancreatic body lesion is decreased and now subcentimeter in size, suggesting a benign lesion. 4. Aortic Atherosclerosis (ICD10-I70.0).    03/03/2018 - 03/07/2018 Hospital Admission    She was hospitalized for GI bleed requiring blood transfusions. Xarelto was placed on hold    03/07/2018 PET scan    1. Persistent hazy omental interstitial nodularity but no hypermetabolism or discrete measurable nodules. No abdominal ascites. 2. No findings for metastatic disease involving the chest. 3. Moderate-sized right pleural effusion and small left pleural effusion.     03/20/2018 Pathology Results     1. Ovary and fallopian tube, right - OVARY AND FALLOPIAN TUBE INVOLVED BY SEROUS CARCINOMA. - PARATUBAL CYST. 2. Uterus +/- tubes/ovaries, neoplastic, cervix, left ovary and fallopian tube - LEFT OVARY: HIGH GRADE SEROUS CARCINOMA WITH TREATMENT EFFECT, SPANNING 2.5 CM. CARCINOMA INVOLVES OVARIAN SURFACE. SEE ONCOLOGY TABLE. - LEFT FALLOPIAN TUBE: INVOLVED BY SEROUS CARCINOMA. - UTERUS: -ENDOMETRIUM: INACTIVE ENDOMETRIUM. NO HYPERPLASIA OR MALIGNANCY. -MYOMETRIUM: UNREMARKABLE. NO MALIGNANCY. -SEROSA: INVOLVED BY SEROUS CARCINOMA. - CERVIX: ENDOCERVICAL POLYP. NO MALIGNANCY. 3. Omentum, resection for tumor - INVOLVED BY SEROUS CARCINOMA. 4. Soft tissue, biopsy, mesenteric nodule - INVOLVED BY SEROUS CARCINOMA. Microscopic Comment 2. OVARY or FALLOPIAN TUBE or PRIMARY PERITONEUM: Procedure: Total hysterectomy and bilateral salpingo-oophorectomy. Omentectomy. Mesenteric lymph node biopsy. Specimen Integrity: Intact. Tumor Site: Left ovary. Ovarian Surface Involvement (required only if applicable): Present. Fallopian Tube Surface Involvement (required only if applicable): Present, bilateral. Tumor Size: 2.5 cm. Histologic Type: High grade serous carcinoma. Histologic  Grade: High grade. Implants (required for advanced stage serous/seromucinous borderline tumors only): N/A. Other Tissue/ Organ Involvement: Bilateral fallopian tubes, right ovary, uterine serosa, omentum. Largest Extrapelvic Peritoneal Focus (required only if applicable): Microscopic, estimated 0.5 cm (omentum). Peritoneal/Ascitic Fluid: Prior Positive ([id number]). Treatment Effect (required only for high-grade serous carcinomas): Present in left ovary. CRS2. Regional Lymph Nodes: No lymph nodes submitted/identified. Pathologic Stage Classification (pTNM, AJCC 8th Edition): ypT3b, ypNX Representative Tumor Block: 1A, 1B, 19F, 55F. Comment(s): The right ovary has only surface deposits with a large paratubal cyst. The  left ovary has intraparenchymal tumor with associated treatment effect. Thus the tumor location is classified as a left ovarian primary.    03/20/2018 Surgery    Procedure(s) Performed:  1. Exploratory laparotomy with total hysterectomy and bilateral salpingo-oophorectomy 2. Infragastic Omentectomy  3. Debulking to <1cm gross residual disease   Surgeon: Mart Piggs, MD  Specimens: Uterus Cervix, Bilateral tubes / ovaries and omentum. Mesenteric nodule.  Operative Findings: Debulked to gross residual disease <1cm; however there is miliary disease in multiple locations including the majority of the abdominal peritoneum (anterior abdominal wall, bilateral gutters), diaphragm (Right>left), majority of small bowel mesentary. Normal appendix. Normal small uterus. Right ovary with a cystic lesion ~3cm, some adhesive disease of right adnexa to rectum/sigmoid. Gross omental disease, which was resected with the omentectomy. Smooth liver surface, but again, diaphragmatic disease noted.       03/20/2018 Genetic Testing    Patient has genetic testing done for ER/PR. Results revealed patient has ER: 90%, PR 0%.     03/31/2018 Tumor Marker    Patient's tumor was tested for the following markers: CA125 Results of the tumor marker test revealed 215.8     Genetic Testing    Patient has genetic testing done for BRCA 1. Results revealed patient has the following: BRCA 1: no loss of expression.     Genetic Testing    Patient has genetic testing done for MMR . Results revealed patient has the following:  MMR: normal    03/31/2018 Genetic Testing    Patient has genetic testing done for BRCA1/2. Results revealed patient has no actionable mutations. She is found to have [place] genetic change of unknown significance    04/21/2018 Imaging    1. No definite findings of residual or recurrent metastatic disease in the abdomen or pelvis status post interval TAHBSO and omentectomy. Stable  minimal thickening in the paracolic gutters without discrete peritoneal nodularity. 2. Trace free fluid in the pelvic cul-de-sac. 3. Stable small dependent right pleural effusion. 4. Subcentimeter pancreatic body cystic lesion is stable to slightly decreased. 5. Aortic Atherosclerosis (ICD10-I70.0).    04/21/2018 Tumor Marker    Patient's tumor was tested for the following markers: CA125 Results of the tumor marker test revealed 187.3    04/23/2018 - 09/12/2018 Anti-estrogen oral therapy    She is placed on Femara    05/08/2018 Procedure    Successful ultrasound guided right thoracentesis yielding 800 mL of pleural fluid. Fluid cytology is negative for malignancy     05/28/2018 Tumor Marker    Patient's tumor was tested for the following markers: CA125 Results of the tumor marker test revealed 190    06/30/2018 Tumor Marker    Patient's tumor was tested for the following markers: CA125 Results of the tumor marker test revealed 148    07/23/2018 Imaging    Status post hysterectomy and bilateral salpingo-oophorectomy.  Very mild peritoneal thickening/nodularity, equivocal but worrisome for very mild peritoneal disease.  Attention on follow-up is suggested.  Small right pleural effusion with indwelling pleural drain.    07/23/2018 Tumor Marker    Patient's tumor was tested for the following markers: CA125 Results of the tumor marker test revealed 215    09/11/2018 Tumor Marker    Patient's tumor was tested for the following markers: CA-125. Results of the tumor marker test revealed 1323    09/12/2018 Imaging    1. Mildly progressive ascites with some mild degree of nodularity most appreciable along the left adnexa, right paracolic gutter, right upper quadrant, compatible with peritoneal spread of tumor. 2. Small right pleural effusion with indwelling right pleural catheter again noted. Mild increase in atelectasis anteriorly at the right lung base. 3. Aortic Atherosclerosis  (ICD10-I70.0). Coronary atherosclerosis. 4. Several tiny hypodense lesions in the spleen are technically nonspecific, but stable. 5. Air fluid level in the rectum compatible with diarrheal process. 6. Prominent bilateral hip arthropathy. Left hip screw noted.    09/22/2018 -  Chemotherapy    The patient had carboplatin and gemzar    09/29/2018 Tumor Marker    Patient's tumor was tested for the following markers: CA125 Results of the tumor marker test revealed 1219    09/29/2018 Adverse Reaction    Cycle 1 day 8 of Gemzar was omitted due to severe anemia     REVIEW OF SYSTEMS:   Constitutional: Denies fevers, chills or abnormal weight loss Eyes: Denies blurriness of vision Ears, nose, mouth, throat, and face: Denies mucositis or sore throat Respiratory: Denies cough, dyspnea or wheezes Cardiovascular: Denies palpitation, chest discomfort or lower extremity swelling Skin: Denies abnormal skin rashes Lymphatics: Denies new lymphadenopathy or easy bruising Neurological:Denies numbness, tingling or new weaknesses Behavioral/Psych: Mood is stable, no new changes  All other systems were reviewed with the patient and are negative.  I have reviewed the past medical history, past surgical history, social history and family history with the patient and they are unchanged from previous note.  ALLERGIES:  is allergic to codeine.  MEDICATIONS:  Current Outpatient Medications  Medication Sig Dispense Refill  . albuterol (PROAIR HFA) 108 (90 Base) MCG/ACT inhaler Inhale 2 puffs into the lungs every 6 (six) hours as needed for wheezing or shortness of breath. 1 Inhaler 3  . ALPRAZolam (XANAX) 0.25 MG tablet Take 1 tablet (0.25 mg total) by mouth at bedtime as needed for anxiety. 5 tablet 0  . amoxicillin (AMOXIL) 500 MG capsule TAKE 4 CAPSULES PO 1 HOUR PRIOR TO DENTAL APPOINTMENT  1  . azelastine (ASTELIN) 0.1 % nasal spray Place 2 sprays into both nostrils 2 (two) times daily as needed for  allergies.     . Cholecalciferol (VITAMIN D) 2000 units CAPS Take 2,000 Units by mouth daily.     Marland Kitchen dicyclomine (BENTYL) 10 MG capsule Take 1 tab by mouth every morning. May take twice daily as needed. (Patient taking differently: Take 10 mg by mouth 2 (two) times daily. ) 60 capsule 6  . diltiazem (DILACOR XR) 180 MG 24 hr capsule Take 180 mg by mouth every morning.    . diltiazem (TIAZAC) 120 MG 24 hr capsule Take 120 mg by mouth daily. At night time    . diphenhydrAMINE (BENADRYL) 25 mg capsule Take 25 mg by mouth daily as needed for itching.     . diphenoxylate-atropine (LOMOTIL) 2.5-0.025 MG tablet Take 1 tablet by mouth 4 (four) times daily. (Patient taking differently: Take 1 tablet by mouth 4 (four) times daily as needed for diarrhea  or loose stools. ) 360 tablet 1  . DULoxetine (CYMBALTA) 60 MG capsule Take 1 capsule (60 mg total) by mouth daily. 30 capsule 3  . fluticasone (FLONASE) 50 MCG/ACT nasal spray Place 2 sprays into both nostrils 2 (two) times daily as needed (FOR NASAL CONGESTION.).     Marland Kitchen furosemide (LASIX) 40 MG tablet Take 1 tablet (40 mg total) by mouth daily. 90 tablet 3  . halobetasol (ULTRAVATE) 0.05 % cream Apply 1 application topically 2 (two) times daily as needed (psoriasis).     Marland Kitchen levothyroxine (SYNTHROID, LEVOTHROID) 137 MCG tablet Take 137 mcg by mouth daily before breakfast. For thyroid therapy    . Magnesium 400 MG CAPS Take 400 mg by mouth daily.    . mirtazapine (REMERON) 15 MG tablet TAKE 1 TABLET(15 MG) BY MOUTH AT BEDTIME (Patient taking differently: Take 15 mg by mouth at bedtime. ) 30 tablet 11  . Oxycodone HCl 10 MG TABS Take 1 tablet (10 mg total) by mouth every 6 (six) hours as needed. 60 tablet 0  . polyvinyl alcohol (LIQUIFILM TEARS) 1.4 % ophthalmic solution Place 1 drop into both eyes 2 (two) times daily as needed for dry eyes.     . potassium chloride SA (K-DUR,KLOR-CON) 20 MEQ tablet Take 10 mEq by mouth daily.    Marland Kitchen STIOLTO RESPIMAT 2.5-2.5 MCG/ACT  AERS INHALE 2 PUFFS INTO THE LUNGS DAILY 4 g 5  . Ustekinumab (STELARA Lebam) Inject 1 Dose into the skin every 3 (three) months.    Alveda Reasons 20 MG TABS tablet TAKE 1 TABLET BY MOUTH EVERY DAY AFTER SUPPER 90 tablet 3   No current facility-administered medications for this visit.     PHYSICAL EXAMINATION: ECOG PERFORMANCE STATUS: 2 - Symptomatic, <50% confined to bed  Vitals:   10/13/18 1336  BP: (!) 123/42  Pulse: 93  Resp: 17  Temp: 97.9 F (36.6 C)  SpO2: 98%   Filed Weights   10/13/18 1336  Weight: 171 lb 6.4 oz (77.7 kg)    GENERAL:alert, no distress and comfortable SKIN: skin color, texture, turgor are normal, no rashes or significant lesions EYES: normal, Conjunctiva are pink and non-injected, sclera clear OROPHARYNX:no exudate, no erythema and lips, buccal mucosa, and tongue normal  NECK: supple, thyroid normal size, non-tender, without nodularity LYMPH:  no palpable lymphadenopathy in the cervical, axillary or inguinal LUNGS: clear to auscultation and percussion with normal breathing effort HEART: regular rate & rhythm and no murmurs and no lower extremity edema ABDOMEN:abdomen soft, non-tender and normal bowel sounds Musculoskeletal:no cyanosis of digits and no clubbing  NEURO: alert & oriented x 3 with fluent speech, no focal motor/sensory deficits  LABORATORY DATA:  I have reviewed the data as listed    Component Value Date/Time   NA 136 10/13/2018 1154   NA 135 (L) 09/24/2017 0945   K 3.6 10/13/2018 1154   K 3.7 09/24/2017 0945   CL 95 (L) 10/13/2018 1154   CO2 33 (H) 10/13/2018 1154   CO2 30 (H) 09/24/2017 0945   GLUCOSE 126 (H) 10/13/2018 1154   GLUCOSE 101 09/24/2017 0945   BUN 19 10/13/2018 1154   BUN 15.3 09/24/2017 0945   CREATININE 1.18 (H) 10/13/2018 1154   CREATININE 1.00 03/13/2018 0940   CREATININE 0.8 09/24/2017 0945   CALCIUM 8.6 (L) 10/13/2018 1154   CALCIUM 9.1 09/24/2017 0945   PROT 6.4 (L) 10/13/2018 1154   PROT 6.6 09/24/2017 0945    ALBUMIN 2.9 (L) 10/13/2018 1154   ALBUMIN  3.3 (L) 09/24/2017 0945   AST 15 10/13/2018 1154   AST 14 03/13/2018 0940   AST 16 09/24/2017 0945   ALT 10 10/13/2018 1154   ALT 7 03/13/2018 0940   ALT 12 09/24/2017 0945   ALKPHOS 57 10/13/2018 1154   ALKPHOS 52 09/24/2017 0945   BILITOT <0.2 (L) 10/13/2018 1154   BILITOT 0.3 03/13/2018 0940   BILITOT 1.13 09/24/2017 0945   GFRNONAA 43 (L) 10/13/2018 1154   GFRNONAA 51 (L) 03/13/2018 0940   GFRAA 49 (L) 10/13/2018 1154   GFRAA 59 (L) 03/13/2018 0940    No results found for: SPEP, UPEP  Lab Results  Component Value Date   WBC 4.2 10/13/2018   NEUTROABS 2.6 10/13/2018   HGB 5.3 (LL) 10/13/2018   HCT 17.9 (L) 10/13/2018   MCV 84.4 10/13/2018   PLT 191 10/13/2018      Chemistry      Component Value Date/Time   NA 136 10/13/2018 1154   NA 135 (L) 09/24/2017 0945   K 3.6 10/13/2018 1154   K 3.7 09/24/2017 0945   CL 95 (L) 10/13/2018 1154   CO2 33 (H) 10/13/2018 1154   CO2 30 (H) 09/24/2017 0945   BUN 19 10/13/2018 1154   BUN 15.3 09/24/2017 0945   CREATININE 1.18 (H) 10/13/2018 1154   CREATININE 1.00 03/13/2018 0940   CREATININE 0.8 09/24/2017 0945      Component Value Date/Time   CALCIUM 8.6 (L) 10/13/2018 1154   CALCIUM 9.1 09/24/2017 0945   ALKPHOS 57 10/13/2018 1154   ALKPHOS 52 09/24/2017 0945   AST 15 10/13/2018 1154   AST 14 03/13/2018 0940   AST 16 09/24/2017 0945   ALT 10 10/13/2018 1154   ALT 7 03/13/2018 0940   ALT 12 09/24/2017 0945   BILITOT <0.2 (L) 10/13/2018 1154   BILITOT 0.3 03/13/2018 0940   BILITOT 1.13 09/24/2017 0945      All questions were answered. The patient knows to call the clinic with any problems, questions or concerns. No barriers to learning was detected.  I spent 30 minutes counseling the patient face to face. The total time spent in the appointment was 40 minutes and more than 50% was on counseling and review of test results  Heath Lark, MD 10/14/2018 2:27 PM

## 2018-10-14 NOTE — Assessment & Plan Note (Signed)
She has multifactorial anemia, likely anemia of chronic disease due to chronic renal failure, recent bleeding and side effects of chemotherapy.  We will put her treatment on hold  We discussed some of the risks, benefits, and alternatives of blood transfusions. The patient is symptomatic from anemia and the hemoglobin level is critically low.  Some of the side-effects to be expected including risks of transfusion reactions, chills, infection, syndrome of volume overload and risk of hospitalization from various reasons and the patient is willing to proceed and went ahead to sign consent today.

## 2018-10-14 NOTE — Assessment & Plan Note (Signed)
She has chronic atrial fibrillation and on chronic anticoagulation therapy I suspect she might have mild GI bleed We have to balance about the risk of bleeding versus the risk of not being on anticoagulation therapy For now, she will continue on chronic anticoagulation therapy and we will continue aggressive transfusion support as needed

## 2018-10-14 NOTE — Assessment & Plan Note (Signed)
She has mild worsening bone pain secondary to bone marrow expansion from severe anemia I recommend her to continue on pain medication along with Tylenol as needed

## 2018-10-14 NOTE — Assessment & Plan Note (Signed)
She has stable chronic kidney disease We will monitor carefully while she is on treatment

## 2018-10-14 NOTE — Telephone Encounter (Signed)
Per 1/6 no los

## 2018-10-16 ENCOUNTER — Encounter (HOSPITAL_COMMUNITY): Payer: Medicare Other

## 2018-10-20 ENCOUNTER — Inpatient Hospital Stay: Payer: Medicare Other

## 2018-10-20 ENCOUNTER — Encounter: Payer: Self-pay | Admitting: Hematology and Oncology

## 2018-10-20 ENCOUNTER — Inpatient Hospital Stay (HOSPITAL_BASED_OUTPATIENT_CLINIC_OR_DEPARTMENT_OTHER): Payer: Medicare Other | Admitting: Hematology and Oncology

## 2018-10-20 ENCOUNTER — Other Ambulatory Visit: Payer: Self-pay | Admitting: Hematology and Oncology

## 2018-10-20 ENCOUNTER — Telehealth: Payer: Self-pay | Admitting: Hematology and Oncology

## 2018-10-20 VITALS — HR 93

## 2018-10-20 DIAGNOSIS — D531 Other megaloblastic anemias, not elsewhere classified: Secondary | ICD-10-CM

## 2018-10-20 DIAGNOSIS — D61818 Other pancytopenia: Secondary | ICD-10-CM | POA: Diagnosis not present

## 2018-10-20 DIAGNOSIS — C562 Malignant neoplasm of left ovary: Secondary | ICD-10-CM

## 2018-10-20 DIAGNOSIS — Z79899 Other long term (current) drug therapy: Secondary | ICD-10-CM

## 2018-10-20 DIAGNOSIS — M549 Dorsalgia, unspecified: Secondary | ICD-10-CM

## 2018-10-20 DIAGNOSIS — D638 Anemia in other chronic diseases classified elsewhere: Secondary | ICD-10-CM

## 2018-10-20 DIAGNOSIS — G893 Neoplasm related pain (acute) (chronic): Secondary | ICD-10-CM

## 2018-10-20 DIAGNOSIS — R531 Weakness: Secondary | ICD-10-CM

## 2018-10-20 DIAGNOSIS — Z5111 Encounter for antineoplastic chemotherapy: Secondary | ICD-10-CM | POA: Diagnosis not present

## 2018-10-20 DIAGNOSIS — I482 Chronic atrial fibrillation, unspecified: Secondary | ICD-10-CM

## 2018-10-20 DIAGNOSIS — N183 Chronic kidney disease, stage 3 (moderate): Secondary | ICD-10-CM

## 2018-10-20 LAB — COMPREHENSIVE METABOLIC PANEL
ALT: 10 U/L (ref 0–44)
ANION GAP: 7 (ref 5–15)
AST: 14 U/L — ABNORMAL LOW (ref 15–41)
Albumin: 3.1 g/dL — ABNORMAL LOW (ref 3.5–5.0)
Alkaline Phosphatase: 64 U/L (ref 38–126)
BUN: 15 mg/dL (ref 8–23)
CO2: 31 mmol/L (ref 22–32)
Calcium: 9 mg/dL (ref 8.9–10.3)
Chloride: 99 mmol/L (ref 98–111)
Creatinine, Ser: 1.03 mg/dL — ABNORMAL HIGH (ref 0.44–1.00)
GFR calc Af Amer: 58 mL/min — ABNORMAL LOW (ref >60)
GFR calc non Af Amer: 50 mL/min — ABNORMAL LOW (ref >60)
GLUCOSE: 116 mg/dL — AB (ref 70–99)
Potassium: 3.5 mmol/L (ref 3.5–5.1)
Sodium: 137 mmol/L (ref 135–145)
Total Bilirubin: 0.3 mg/dL (ref 0.3–1.2)
Total Protein: 6.9 g/dL (ref 6.5–8.1)

## 2018-10-20 LAB — CBC WITH DIFFERENTIAL/PLATELET
Abs Immature Granulocytes: 0.09 10*3/uL — ABNORMAL HIGH (ref 0.00–0.07)
Basophils Absolute: 0 10*3/uL (ref 0.0–0.1)
Basophils Relative: 0 %
Eosinophils Absolute: 0.2 10*3/uL (ref 0.0–0.5)
Eosinophils Relative: 3 %
HCT: 26.4 % — ABNORMAL LOW (ref 36.0–46.0)
Hemoglobin: 8.2 g/dL — ABNORMAL LOW (ref 12.0–15.0)
Immature Granulocytes: 1 %
LYMPHS PCT: 17 %
Lymphs Abs: 1.3 10*3/uL (ref 0.7–4.0)
MCH: 25.9 pg — ABNORMAL LOW (ref 26.0–34.0)
MCHC: 31.1 g/dL (ref 30.0–36.0)
MCV: 83.5 fL (ref 80.0–100.0)
Monocytes Absolute: 1.1 10*3/uL — ABNORMAL HIGH (ref 0.1–1.0)
Monocytes Relative: 13 %
Neutro Abs: 5.1 10*3/uL (ref 1.7–7.7)
Neutrophils Relative %: 66 %
Platelets: 305 10*3/uL (ref 150–400)
RBC: 3.16 MIL/uL — ABNORMAL LOW (ref 3.87–5.11)
RDW: 15.3 % (ref 11.5–15.5)
WBC: 7.8 10*3/uL (ref 4.0–10.5)
nRBC: 0 % (ref 0.0–0.2)

## 2018-10-20 LAB — SAMPLE TO BLOOD BANK

## 2018-10-20 LAB — MAGNESIUM: Magnesium: 1.6 mg/dL — ABNORMAL LOW (ref 1.7–2.4)

## 2018-10-20 MED ORDER — SODIUM CHLORIDE 0.9% FLUSH
10.0000 mL | INTRAVENOUS | Status: DC | PRN
  Filled 2018-10-20: qty 10

## 2018-10-20 MED ORDER — SODIUM CHLORIDE 0.9 % IV SOLN
Freq: Once | INTRAVENOUS | Status: AC
  Administered 2018-10-20: 14:00:00 via INTRAVENOUS
  Filled 2018-10-20: qty 5

## 2018-10-20 MED ORDER — DIPHENHYDRAMINE HCL 25 MG PO TABS
25.0000 mg | ORAL_TABLET | Freq: Once | ORAL | Status: AC
  Administered 2018-10-20: 25 mg via ORAL
  Filled 2018-10-20: qty 1

## 2018-10-20 MED ORDER — PALONOSETRON HCL INJECTION 0.25 MG/5ML
INTRAVENOUS | Status: AC
  Filled 2018-10-20: qty 5

## 2018-10-20 MED ORDER — SODIUM CHLORIDE 0.9% FLUSH
10.0000 mL | Freq: Once | INTRAVENOUS | Status: AC
  Administered 2018-10-20: 10 mL
  Filled 2018-10-20: qty 10

## 2018-10-20 MED ORDER — SODIUM CHLORIDE 0.9 % IV SOLN
400.0000 mg/m2 | Freq: Once | INTRAVENOUS | Status: AC
  Administered 2018-10-20: 760 mg via INTRAVENOUS
  Filled 2018-10-20: qty 19.99

## 2018-10-20 MED ORDER — FAMOTIDINE IN NACL 20-0.9 MG/50ML-% IV SOLN
INTRAVENOUS | Status: AC
  Filled 2018-10-20: qty 50

## 2018-10-20 MED ORDER — HEPARIN SOD (PORK) LOCK FLUSH 100 UNIT/ML IV SOLN
500.0000 [IU] | Freq: Once | INTRAVENOUS | Status: DC | PRN
  Filled 2018-10-20: qty 5

## 2018-10-20 MED ORDER — PALONOSETRON HCL INJECTION 0.25 MG/5ML
0.2500 mg | Freq: Once | INTRAVENOUS | Status: AC
  Administered 2018-10-20: 0.25 mg via INTRAVENOUS

## 2018-10-20 MED ORDER — FAMOTIDINE IN NACL 20-0.9 MG/50ML-% IV SOLN
20.0000 mg | Freq: Once | INTRAVENOUS | Status: AC
  Administered 2018-10-20: 20 mg via INTRAVENOUS

## 2018-10-20 MED ORDER — SODIUM CHLORIDE 0.9 % IV SOLN
152.4000 mg | Freq: Once | INTRAVENOUS | Status: AC
  Administered 2018-10-20: 150 mg via INTRAVENOUS
  Filled 2018-10-20: qty 15

## 2018-10-20 MED ORDER — DIPHENHYDRAMINE HCL 25 MG PO CAPS
ORAL_CAPSULE | ORAL | Status: AC
  Filled 2018-10-20: qty 1

## 2018-10-20 MED ORDER — SODIUM CHLORIDE 0.9 % IV SOLN
Freq: Once | INTRAVENOUS | Status: AC
  Administered 2018-10-20: 14:00:00 via INTRAVENOUS
  Filled 2018-10-20: qty 250

## 2018-10-20 NOTE — Progress Notes (Signed)
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[place]) Her blood count is stable We will resume chemotherapy today We discussed the risk and benefits of continuing combination chemotherapy versus single agent and the patient would like to proceed I have reduced the dose of chemotherapy further and plan to change her treatment to every 2 weeks She will return here every week for transfusion support and blood count monitoring  Anemia, chronic disease She has received blood transfusion recently She denies signs of bleeding She will continue her treatment as schedule and return next week for blood count monitoring and transfusion as needed, to keep hemoglobin greater than 8  Chronic atrial fibrillation She has chronic atrial fibrillation and on chronic anticoagulation therapy For now, she will continue on chronic anticoagulation therapy and we will continue aggressive transfusion support as needed  Generalized weakness She has significant debility and generalized weakness I recommend home physical therapy and rehab referral and she agreed  Hypomagnesemia This is likely due to presence of drain and poor oral intake She will continue oral magnesium therapy   Orders Placed This Encounter  Procedures  . Ambulatory referral to Home Health    Referral Priority:   Routine    Referral Type:   Home Health Care    Referral Reason:   Specialty Services Required    Requested Specialty:   [place]    Number of Visits Requested:   1    INTERVAL HISTORY: Please see below for problem oriented  charting. She returns with her daughter to be seen prior to day 8 of treatment Her chemotherapy was placed on hold last week due to severe anemia She felt stronger since she has received blood transfusion The patient denies any recent signs or symptoms of bleeding such as spontaneous epistaxis, hematuria or hematochezia. Shortness of breath has improved and she does not need round-the-clock oxygen therapy No recent cough She felt weak overall No recent diarrhea.  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 Interim finding of moderate ascites within the abdomen and pelvis with additional finding of diffuse nodular infiltration of the omentum and anterior mesenteric fat, the appearance would be consistent with peritoneal carcinomatosis/metastatic disease. Increasing retroperitoneal and upper abdominal adenopathy. 2. Re- demonstrated 3.8 cm cyst in the right adnexa. Enlarging soft tissue density in the left adnexa now with possible cystic component posteriorly. In light of the above findings, concern is for ovarian neoplasm. Correlation with pelvic ultrasound recommended. 3. Small right-sided pleural effusion, new since prior study 4. Stable hypodense splenic lesions since  2017.     06/25/2017 Imaging    US pelvis: 2.9 cm simple appearing cyst in the right ovary. Left ovary grossly unremarkable. Large volume ascites in the pelvis    06/30/2017 - 07/01/2017 Hospital Admission    She  was admitted for evaluation of abdominal pain and ascites    07/01/2017 Pathology Results    PERITONEAL/ASCITIC FLUID(SPECIMEN 1 OF 1 COLLECTED 07/01/17): - POORLY DIFFERENTIATED CARCINOMA; SEE COMMENT Source Peritoneal/Ascitic Fluid, (specimen 1 of 1 collected 07/01/17) Gross Specimen: Received is/are 1000 cc's of brownish fluid. (BS:bs) Prepared: # Smears: 0 # Concentration Technique Slides (i.e. ThinPrep): 1 # Cell Block: 1 Additional Studies: Also received Hematology slide - [id number]. Comment The tumor cells are positive for cytokeratin 7 and Pax-8 but negative for cytokeratin 20, CDX-2, GATA-3, Napsin-A and TTF-1. Based on the immunoprofile a gynecology primary is favored    07/01/2017 Procedure    Successful ultrasound-guided diagnostic and therapeutic paracentesis yielding 2.5 liters of peritoneal fluid    07/07/2017 - 07/09/2017 Hospital Admission    She was admitted for management of malignant ascites    07/08/2017 Procedure    Successful ultrasound-guided therapeutic paracentesis yielding 2.7 liters liters of peritoneal fluid    07/12/2017 Procedure    Successful ultrasound-guided paracentesis yielding 1450 mL of peritoneal fluid    07/18/2017 - 07/24/2017 Hospital Admission    She was admitted for expedited treatment    07/18/2017 Tumor Marker    Patient's tumor was tested for the following markers: CA125 Results of the tumor marker test revealed 1941    07/19/2017 - 02/04/2018 Chemotherapy    The patient had 6 cycles of carboplatin & Taxol for chemotherapy treatment, followed by 3 more cycles of carboplatin only     07/19/2017 - 02/04/2018 Chemotherapy    The patient had carboplatin and taxol    08/06/2017 Procedure    Successful ultrasound-guided therapeutic paracentesis yielding 2.6 liters of peritoneal fluid.    08/09/2017 Tumor Marker    Patient's tumor was tested for the following markers: CA125 Results of the tumor marker test revealed 1665    08/15/2017 Tumor  Marker    Patient's tumor was tested for the following markers: CA125 Results of the tumor marker test revealed 937.9    08/20/2017 Imaging    ECHO: Normal LV size with EF 60-65%. Normal RV size and systolic function. No significant valvular abnormalities.    09/18/2017 Imaging    Chest Impression:  1. No evidence thoracic metastasis. 2. Interval increase and RIGHT pleural effusion.  Abdomen / Pelvis Impression:  1. Interval decrease in intraperitoneal free fluid. 2. Interval decrease in omental nodularity in the LEFT ventral peritoneal space. 3. Interval decrease in nodularity associated with the LEFT ovary. 4. Cystic portion of the RIGHT ovary is increased mildly in size.    09/20/2017 Tumor Marker    Patient's tumor was tested for the following markers: CA125 Results of the tumor marker test revealed 347    10/14/2017 Tumor Marker    Patient's tumor was tested for the following markers: CA125 Results of the tumor marker test revealed 307.4    11/04/2017 Tumor Marker    Patient's tumor was tested for the following markers: CA125 Results of the tumor marker test revealed 262.5    11/28/2017 Imaging    1. Interval decrease in right pleural effusion with resolution of right atelectasis seen previously. 2. New small left pleural effusion, symmetric to the right. 3. No intraperitoneal free fluid on the current study. 4. Continued further  decrease in left omental disease, appearing less confluent today than on the prior study. 5. Left ovary remains normal in appearance today and the right adnexal cystic lesion is decreased in size compared to prior study. 6. 14 mm pancreatic cyst is unchanged. Continued attention on follow-up imaging recommended. 7. Aortic Atherosclerois (ICD10-170.0)    12/13/2017 Tumor Marker    Patient's tumor was tested for the following markers: CA125 Results of the tumor marker test revealed 197.7    01/03/2018 Tumor Marker    Patient's tumor was tested for  the following markers: CA125 Results of the tumor marker test revealed 183.1    01/14/2018 Tumor Marker    Patient's tumor was tested for the following markers: CA125 Results of the tumor marker test revealed 177.4    02/04/2018 Tumor Marker    Patient's tumor was tested for the following markers: CA125 Results of the tumor marker test revealed 168.5    02/25/2018 Imaging    1. Omental carcinomatosis appears qualitatively stable to slightly decreased. Stable mild peritoneal thickening in the paracolic gutters. Stable right adnexal cyst. No ascites. No new or progressive metastatic disease in the abdomen or pelvis. 2. Small dependent right pleural effusion is increased. 3. Cystic pancreatic body lesion is decreased and now subcentimeter in size, suggesting a benign lesion. 4. Aortic Atherosclerosis (ICD10-I70.0).    03/03/2018 - 03/07/2018 Hospital Admission    She was hospitalized for GI bleed requiring blood transfusions. Xarelto was placed on hold    03/07/2018 PET scan    1. Persistent hazy omental interstitial nodularity but no hypermetabolism or discrete measurable nodules. No abdominal ascites. 2. No findings for metastatic disease involving the chest. 3. Moderate-sized right pleural effusion and small left pleural effusion.     03/20/2018 Pathology Results    1. Ovary and fallopian tube, right - OVARY AND FALLOPIAN TUBE INVOLVED BY SEROUS CARCINOMA. - PARATUBAL CYST. 2. Uterus +/- tubes/ovaries, neoplastic, cervix, left ovary and fallopian tube - LEFT OVARY: HIGH GRADE SEROUS CARCINOMA WITH TREATMENT EFFECT, SPANNING 2.5 CM. CARCINOMA INVOLVES OVARIAN SURFACE. SEE ONCOLOGY TABLE. - LEFT FALLOPIAN TUBE: INVOLVED BY SEROUS CARCINOMA. - UTERUS: -ENDOMETRIUM: INACTIVE ENDOMETRIUM. NO HYPERPLASIA OR MALIGNANCY. -MYOMETRIUM: UNREMARKABLE. NO MALIGNANCY. -SEROSA: INVOLVED BY SEROUS CARCINOMA. - CERVIX: ENDOCERVICAL POLYP. NO MALIGNANCY. 3. Omentum, resection for tumor - INVOLVED BY  SEROUS CARCINOMA. 4. Soft tissue, biopsy, mesenteric nodule - INVOLVED BY SEROUS CARCINOMA. Microscopic Comment 2. OVARY or FALLOPIAN TUBE or PRIMARY PERITONEUM: Procedure: Total hysterectomy and bilateral salpingo-oophorectomy. Omentectomy. Mesenteric lymph node biopsy. Specimen Integrity: Intact. Tumor Site: Left ovary. Ovarian Surface Involvement (required only if applicable): Present. Fallopian Tube Surface Involvement (required only if applicable): Present, bilateral. Tumor Size: 2.5 cm. Histologic Type: High grade serous carcinoma. Histologic Grade: High grade. Implants (required for advanced stage serous/seromucinous borderline tumors only): N/A. Other Tissue/ Organ Involvement: Bilateral fallopian tubes, right ovary, uterine serosa, omentum. Largest Extrapelvic Peritoneal Focus (required only if applicable): Microscopic, estimated 0.5 cm (omentum). Peritoneal/Ascitic Fluid: Prior Positive ([id number]). Treatment Effect (required only for high-grade serous carcinomas): Present in left ovary. CRS2. Regional Lymph Nodes: No lymph nodes submitted/identified. Pathologic Stage Classification (pTNM, AJCC 8th Edition): ypT3b, ypNX Representative Tumor Block: 1A, 1B, 44F, 3F. Comment(s): The right ovary has only surface deposits with a large paratubal cyst. The left ovary has intraparenchymal tumor with associated treatment effect. Thus the tumor location is classified as a left ovarian primary.    03/20/2018 Surgery    Procedure(s) Performed:  1. Exploratory laparotomy with total hysterectomy and bilateral salpingo-oophorectomy  2. Infragastic Omentectomy  3. Debulking to <1cm gross residual disease   Surgeon: Mart Piggs, MD  Specimens: Uterus Cervix, Bilateral tubes / ovaries and omentum. Mesenteric nodule.  Operative Findings: Debulked to gross residual disease <1cm; however there is miliary disease in multiple locations including the majority of the abdominal  peritoneum (anterior abdominal wall, bilateral gutters), diaphragm (Right>left), majority of small bowel mesentary. Normal appendix. Normal small uterus. Right ovary with a cystic lesion ~3cm, some adhesive disease of right adnexa to rectum/sigmoid. Gross omental disease, which was resected with the omentectomy. Smooth liver surface, but again, diaphragmatic disease noted.       03/20/2018 Genetic Testing    Patient has genetic testing done for ER/PR. Results revealed patient has ER: 90%, PR 0%.     03/31/2018 Tumor Marker    Patient's tumor was tested for the following markers: CA125 Results of the tumor marker test revealed 215.8     Genetic Testing    Patient has genetic testing done for BRCA 1. Results revealed patient has the following: BRCA 1: no loss of expression.     Genetic Testing    Patient has genetic testing done for MMR . Results revealed patient has the following:  MMR: normal    03/31/2018 Genetic Testing    Patient has genetic testing done for BRCA1/2. Results revealed patient has no actionable mutations. She is found to have [place] genetic change of unknown significance    04/21/2018 Imaging    1. No definite findings of residual or recurrent metastatic disease in the abdomen or pelvis status post interval TAHBSO and omentectomy. Stable minimal thickening in the paracolic gutters without discrete peritoneal nodularity. 2. Trace free fluid in the pelvic cul-de-sac. 3. Stable small dependent right pleural effusion. 4. Subcentimeter pancreatic body cystic lesion is stable to slightly decreased. 5. Aortic Atherosclerosis (ICD10-I70.0).    04/21/2018 Tumor Marker    Patient's tumor was tested for the following markers: CA125 Results of the tumor marker test revealed 187.3    04/23/2018 - 09/12/2018 Anti-estrogen oral therapy    She is placed on Femara    05/08/2018 Procedure    Successful ultrasound guided right thoracentesis yielding 800 mL of pleural fluid. Fluid  cytology is negative for malignancy     05/28/2018 Tumor Marker    Patient's tumor was tested for the following markers: CA125 Results of the tumor marker test revealed 190    06/30/2018 Tumor Marker    Patient's tumor was tested for the following markers: CA125 Results of the tumor marker test revealed 148    07/23/2018 Imaging    Status post hysterectomy and bilateral salpingo-oophorectomy.  Very mild peritoneal thickening/nodularity, equivocal but worrisome for very mild peritoneal disease. Attention on follow-up is suggested.  Small right pleural effusion with indwelling pleural drain.    07/23/2018 Tumor Marker    Patient's tumor was tested for the following markers: CA125 Results of the tumor marker test revealed 215    09/11/2018 Tumor Marker    Patient's tumor was tested for the following markers: CA-125. Results of the tumor marker test revealed 1323    09/12/2018 Imaging    1. Mildly progressive ascites with some mild degree of nodularity most appreciable along the left adnexa, right paracolic gutter, right upper quadrant, compatible with peritoneal spread of tumor. 2. Small right pleural effusion with indwelling right pleural catheter again noted. Mild increase in atelectasis anteriorly at the right lung base. 3. Aortic Atherosclerosis (ICD10-I70.0). Coronary atherosclerosis. 4. Several tiny  hypodense lesions in the spleen are technically nonspecific, but stable. 5. Air fluid level in the rectum compatible with diarrheal process. 6. Prominent bilateral hip arthropathy. Left hip screw noted.    09/22/2018 -  Chemotherapy    The patient had carboplatin and gemzar    09/29/2018 Tumor Marker    Patient's tumor was tested for the following markers: CA125 Results of the tumor marker test revealed 1219    09/29/2018 Adverse Reaction    Cycle 1 day 8 of Gemzar was omitted due to severe anemia     REVIEW OF SYSTEMS:   Constitutional: Denies fevers, chills or abnormal  weight loss Eyes: Denies blurriness of vision Ears, nose, mouth, throat, and face: Denies mucositis or sore throat Respiratory: Denies cough, dyspnea or wheezes Cardiovascular: Denies palpitation, chest discomfort or lower extremity swelling Gastrointestinal:  Denies nausea, heartburn or change in bowel habits Skin: Denies abnormal skin rashes Lymphatics: Denies new lymphadenopathy or easy bruising Behavioral/Psych: Mood is stable, no new changes  All other systems were reviewed with the patient and are negative.  I have reviewed the past medical history, past surgical history, social history and family history with the patient and they are unchanged from previous note.  ALLERGIES:  is allergic to codeine.  MEDICATIONS:  Current Outpatient Medications  Medication Sig Dispense Refill  . albuterol (PROAIR HFA) 108 (90 Base) MCG/ACT inhaler Inhale 2 puffs into the lungs every 6 (six) hours as needed for wheezing or shortness of breath. 1 Inhaler 3  . ALPRAZolam (XANAX) 0.25 MG tablet Take 1 tablet (0.25 mg total) by mouth at bedtime as needed for anxiety. 5 tablet 0  . amoxicillin (AMOXIL) 500 MG capsule TAKE 4 CAPSULES PO 1 HOUR PRIOR TO DENTAL APPOINTMENT  1  . azelastine (ASTELIN) 0.1 % nasal spray Place 2 sprays into both nostrils 2 (two) times daily as needed for allergies.     . Cholecalciferol (VITAMIN D) 2000 units CAPS Take 2,000 Units by mouth daily.     Marland Kitchen dicyclomine (BENTYL) 10 MG capsule Take 1 tab by mouth every morning. May take twice daily as needed. (Patient taking differently: Take 10 mg by mouth 2 (two) times daily. ) 60 capsule 6  . diltiazem (DILACOR XR) 180 MG 24 hr capsule Take 180 mg by mouth every morning.    . diltiazem (TIAZAC) 120 MG 24 hr capsule Take 120 mg by mouth daily. At night time    . diphenhydrAMINE (BENADRYL) 25 mg capsule Take 25 mg by mouth daily as needed for itching.     . diphenoxylate-atropine (LOMOTIL) 2.5-0.025 MG tablet Take 1 tablet by mouth 4  (four) times daily. (Patient taking differently: Take 1 tablet by mouth 4 (four) times daily as needed for diarrhea or loose stools. ) 360 tablet 1  . DULoxetine (CYMBALTA) 60 MG capsule Take 1 capsule (60 mg total) by mouth daily. 30 capsule 3  . fluticasone (FLONASE) 50 MCG/ACT nasal spray Place 2 sprays into both nostrils 2 (two) times daily as needed (FOR NASAL CONGESTION.).     Marland Kitchen furosemide (LASIX) 40 MG tablet Take 1 tablet (40 mg total) by mouth daily. 90 tablet 3  . halobetasol (ULTRAVATE) 0.05 % cream Apply 1 application topically 2 (two) times daily as needed (psoriasis).     Marland Kitchen levothyroxine (SYNTHROID, LEVOTHROID) 137 MCG tablet Take 137 mcg by mouth daily before breakfast. For thyroid therapy    . Magnesium 400 MG CAPS Take 400 mg by mouth daily.    Marland Kitchen  mirtazapine (REMERON) 15 MG tablet TAKE 1 TABLET(15 MG) BY MOUTH AT BEDTIME (Patient taking differently: Take 15 mg by mouth at bedtime. ) 30 tablet 11  . Oxycodone HCl 10 MG TABS Take 1 tablet (10 mg total) by mouth every 6 (six) hours as needed. 60 tablet 0  . polyvinyl alcohol (LIQUIFILM TEARS) 1.4 % ophthalmic solution Place 1 drop into both eyes 2 (two) times daily as needed for dry eyes.     . potassium chloride SA (K-DUR,KLOR-CON) 20 MEQ tablet Take 10 mEq by mouth daily.    Marland Kitchen STIOLTO RESPIMAT 2.5-2.5 MCG/ACT AERS INHALE 2 PUFFS INTO THE LUNGS DAILY 4 g 5  . Ustekinumab (STELARA [place]) Inject 1 Dose into the skin every 3 (three) months.    Alveda Reasons 20 MG TABS tablet TAKE 1 TABLET BY MOUTH EVERY DAY AFTER SUPPER 90 tablet 3   No current facility-administered medications for this visit.     PHYSICAL EXAMINATION: ECOG PERFORMANCE STATUS: 2 - Symptomatic, <50% confined to bed  Vitals:   10/20/18 1135  BP: (!) 128/42  Pulse: (!) 106  Resp: 18  Temp: 98.1 F (36.7 C)  SpO2: 95%   Filed Weights   10/20/18 1135  Weight: 168 lb 12.8 oz (76.6 kg)    GENERAL:alert, no distress and comfortable.  She looks debilitated, sitting on  the wheelchair SKIN: skin color, texture, turgor are normal, no rashes or significant lesions EYES: normal, Conjunctiva are pink and non-injected, sclera clear OROPHARYNX:no exudate, no erythema and lips, buccal mucosa, and tongue normal  NECK: supple, thyroid normal size, non-tender, without nodularity LYMPH:  no palpable lymphadenopathy in the cervical, axillary or inguinal LUNGS: clear to auscultation and percussion with normal breathing effort HEART: regular rate & rhythm and no murmurs and no lower extremity edema ABDOMEN:abdomen soft, non-tender and normal bowel sounds Musculoskeletal:no cyanosis of digits and no clubbing  NEURO: alert & oriented x 3 with fluent speech, no focal motor/sensory deficits  LABORATORY DATA:  I have reviewed the data as listed    Component Value Date/Time   NA 137 10/20/2018 1147   NA 135 (L) 09/24/2017 0945   K 3.5 10/20/2018 1147   K 3.7 09/24/2017 0945   CL 99 10/20/2018 1147   CO2 31 10/20/2018 1147   CO2 30 (H) 09/24/2017 0945   GLUCOSE 116 (H) 10/20/2018 1147   GLUCOSE 101 09/24/2017 0945   BUN 15 10/20/2018 1147   BUN 15.3 09/24/2017 0945   CREATININE 1.03 (H) 10/20/2018 1147   CREATININE 1.00 03/13/2018 0940   CREATININE 0.8 09/24/2017 0945   CALCIUM 9.0 10/20/2018 1147   CALCIUM 9.1 09/24/2017 0945   PROT 6.9 10/20/2018 1147   PROT 6.6 09/24/2017 0945   ALBUMIN 3.1 (L) 10/20/2018 1147   ALBUMIN 3.3 (L) 09/24/2017 0945   AST 14 (L) 10/20/2018 1147   AST 14 03/13/2018 0940   AST 16 09/24/2017 0945   ALT 10 10/20/2018 1147   ALT 7 03/13/2018 0940   ALT 12 09/24/2017 0945   ALKPHOS 64 10/20/2018 1147   ALKPHOS 52 09/24/2017 0945   BILITOT 0.3 10/20/2018 1147   BILITOT 0.3 03/13/2018 0940   BILITOT 1.13 09/24/2017 0945   GFRNONAA 50 (L) 10/20/2018 1147   GFRNONAA 51 (L) 03/13/2018 0940   GFRAA 58 (L) 10/20/2018 1147   GFRAA 59 (L) 03/13/2018 0940    No results found for: SPEP, UPEP  Lab Results  Component Value Date   WBC  7.8 10/20/2018   NEUTROABS 5.1  10/20/2018   HGB 8.2 (L) 10/20/2018   HCT 26.4 (L) 10/20/2018   MCV 83.5 10/20/2018   PLT 305 10/20/2018      Chemistry      Component Value Date/Time   NA 137 10/20/2018 1147   NA 135 (L) 09/24/2017 0945   K 3.5 10/20/2018 1147   K 3.7 09/24/2017 0945   CL 99 10/20/2018 1147   CO2 31 10/20/2018 1147   CO2 30 (H) 09/24/2017 0945   BUN 15 10/20/2018 1147   BUN 15.3 09/24/2017 0945   CREATININE 1.03 (H) 10/20/2018 1147   CREATININE 1.00 03/13/2018 0940   CREATININE 0.8 09/24/2017 0945      Component Value Date/Time   CALCIUM 9.0 10/20/2018 1147   CALCIUM 9.1 09/24/2017 0945   ALKPHOS 64 10/20/2018 1147   ALKPHOS 52 09/24/2017 0945   AST 14 (L) 10/20/2018 1147   AST 14 03/13/2018 0940   AST 16 09/24/2017 0945   ALT 10 10/20/2018 1147   ALT 7 03/13/2018 0940   ALT 12 09/24/2017 0945   BILITOT 0.3 10/20/2018 1147   BILITOT 0.3 03/13/2018 0940   BILITOT 1.13 09/24/2017 0945       All questions were answered. The patient knows to call the clinic with any problems, questions or concerns. No barriers to learning was detected.  I spent 25 minutes counseling the patient face to face. The total time spent in the appointment was 30 minutes and more than 50% was on counseling and review of test results  Heath Lark, MD 10/20/2018 12:30 PM

## 2018-10-20 NOTE — Assessment & Plan Note (Signed)
She has chronic atrial fibrillation and on chronic anticoagulation therapy For now, she will continue on chronic anticoagulation therapy and we will continue aggressive transfusion support as needed

## 2018-10-20 NOTE — Telephone Encounter (Signed)
Waiting for approval for both days tried to call patient care center they did not answer

## 2018-10-20 NOTE — Assessment & Plan Note (Signed)
Her blood count is stable We will resume chemotherapy today We discussed the risk and benefits of continuing combination chemotherapy versus single agent and the patient would like to proceed I have reduced the dose of chemotherapy further and plan to change her treatment to every 2 weeks She will return here every week for transfusion support and blood count monitoring

## 2018-10-20 NOTE — Assessment & Plan Note (Signed)
She has significant debility and generalized weakness I recommend home physical therapy and rehab referral and she agreed

## 2018-10-20 NOTE — Assessment & Plan Note (Signed)
She has received blood transfusion recently She denies signs of bleeding She will continue her treatment as schedule and return next week for blood count monitoring and transfusion as needed, to keep hemoglobin greater than 8

## 2018-10-20 NOTE — Patient Instructions (Signed)
[facility] Discharge Instructions for Patients Receiving Chemotherapy  Today you received the following chemotherapy agents Gemzar and Carboplatin   To help prevent nausea and vomiting after your treatment, we encourage you to take your nausea medication as directed.    If you develop nausea and vomiting that is not controlled by your nausea medication, call the clinic.   BELOW ARE SYMPTOMS THAT SHOULD BE REPORTED IMMEDIATELY:  *FEVER GREATER THAN 100.5 F  *CHILLS WITH OR WITHOUT FEVER  NAUSEA AND VOMITING THAT IS NOT CONTROLLED WITH YOUR NAUSEA MEDICATION  *UNUSUAL SHORTNESS OF BREATH  *UNUSUAL BRUISING OR BLEEDING  TENDERNESS IN MOUTH AND THROAT WITH OR WITHOUT PRESENCE OF ULCERS  *URINARY PROBLEMS  *BOWEL PROBLEMS  UNUSUAL RASH Items with * indicate a potential emergency and should be followed up as soon as possible.  Feel free to call the clinic should you have any questions or concerns. The clinic phone number is [phone].  Please show the CHEMO ALERT CARD at check-in to the Emergency Department and triage nurse.   

## 2018-10-20 NOTE — Assessment & Plan Note (Signed)
This is likely due to presence of drain and poor oral intake She will continue oral magnesium therapy

## 2018-10-21 ENCOUNTER — Encounter (HOSPITAL_COMMUNITY): Payer: Medicare Other

## 2018-10-21 LAB — CA 125: Cancer Antigen (CA) 125: 402 U/mL — ABNORMAL HIGH (ref 0.0–38.1)

## 2018-10-22 ENCOUNTER — Telehealth: Payer: Self-pay | Admitting: Hematology and Oncology

## 2018-10-22 ENCOUNTER — Other Ambulatory Visit: Payer: Self-pay | Admitting: Cardiothoracic Surgery

## 2018-10-22 DIAGNOSIS — J90 Pleural effusion, not elsewhere classified: Secondary | ICD-10-CM

## 2018-10-22 NOTE — Progress Notes (Signed)
cxr 

## 2018-10-22 NOTE — Telephone Encounter (Signed)
Called regarding 1/21 had to put blood at patient care center

## 2018-10-23 ENCOUNTER — Other Ambulatory Visit: Payer: Self-pay

## 2018-10-23 ENCOUNTER — Encounter (HOSPITAL_COMMUNITY): Payer: Medicare Other

## 2018-10-23 ENCOUNTER — Encounter: Payer: Self-pay | Admitting: Cardiothoracic Surgery

## 2018-10-23 ENCOUNTER — Ambulatory Visit (INDEPENDENT_AMBULATORY_CARE_PROVIDER_SITE_OTHER): Payer: Medicare Other | Admitting: Cardiothoracic Surgery

## 2018-10-23 ENCOUNTER — Ambulatory Visit
Admission: RE | Admit: 2018-10-23 | Discharge: 2018-10-23 | Disposition: A | Discharge status: Home / Self Care | Payer: Medicare Other | Source: Ambulatory Visit | Attending: Cardiothoracic Surgery | Admitting: Cardiothoracic Surgery

## 2018-10-23 VITALS — BP 102/62 | HR 98 | Resp 16 | Ht 64.0 in | Wt 168.0 lb

## 2018-10-23 DIAGNOSIS — J90 Pleural effusion, not elsewhere classified: Secondary | ICD-10-CM | POA: Diagnosis not present

## 2018-10-23 DIAGNOSIS — Z9689 Presence of other specified functional implants: Secondary | ICD-10-CM | POA: Diagnosis not present

## 2018-10-23 NOTE — Progress Notes (Signed)
[address]       [place] [address]             [phone]      A[place] S [facility] Medical Record #[id number] Date of Birth: 04-21-1933  Referring: Collene Gobble, MD Primary Care: Asencion Noble, MD Primary Cardiologist: Rozann Lesches, MD   Chief Complaint:   POST OP FOLLOW UP 06/24/2018 PREOPERATIVE DIAGNOSIS: Recurrent right pleural effusion with history of ovarian carcinoma. POSTOPERATIVE DIAGNOSIS: Recurrent right pleural effusion with history of ovarian carcinoma. SURGICAL PROCEDURE: Placement of right PleurX catheter with ultrasound and fluoroscopic guidance History of Present Illness:     Patient returns to the office today for follow-up after placement of a right Pleurx catheter in September.  She is draining the catheter approximately once every 4 days with between 90 and 190 mL each time.  Respiratory wise she is been asymptomatic.  Continues on chemotherapy, she notes her counts were low last week and had to get 2 units of packed red blood cells.       Past Medical History:  Diagnosis Date  . Anxiety   . Chronic blood loss anemia    03-04-2018 diverticular bleed and rectal bleeding,  transfused 2 units PRBCs 03-08-2018  . Colitis   . COPD (chronic obstructive pulmonary disease) (HCC)    Dr. Lamonte Sakai  . Depression   . Diastolic CHF, chronic ([place])   . Diverticulosis   . Family history of colon cancer   . Fibromyalgia   . Genetic testing 04/07/2018   MyRisk (35 genes) @ [facility] - No pathogenic mutations detected  . GERD (gastroesophageal reflux disease)   . Hemorrhoids   . Hiatal hernia   . Hip pain 07/2018   Right Hip Pain  . History of rectal polyps   . History of shingles   . Hypothyroidism   . IBS (irritable bowel syndrome)   . Lymphocytic colitis    Dr. Henrene Pastor  . Malignant ascites    Admission 06/2017 abdominal s/p parencentesis 07-01-2017 2.5L, 07-08-2016  2.7L, 07-12-2017  1416m  . [place] due to  chemotherapeutic drug ([place]   . Osteoporosis   . Ovarian cancer ([facility]    Chemotherapy - Dr. GAlvy Bimler . Paroxysmal atrial fibrillation ([facility]    Xarelto stopped 03-07-2018 due to lower GI bleed  . Pleural effusion    s/p  right thoracentesis, 02-2018 1.3L and 03-17-2018 right thoracentesis 6462m, post cxr no residual effusion  . [place]soriatic arthritis ([place]  . Schatzki's ring 2013  . Seasonal allergic rhinitis      Social History   Tobacco Use  Smoking Status Former Smoker  . Packs/day: 1.00  . Years: 20.00  . Pack years: 20.00  . Types: Cigarettes  . Last attempt to quit: 10/08/1980  . Years since quitting: 38.0  Smokeless Tobacco Never Used    Social History   Substance and Sexual Activity  Alcohol Use Yes   Comment: rarely, 12-23-15 rarely     Allergies  Allergen Reactions  . Codeine Itching    Current Outpatient Medications  Medication Sig Dispense Refill  . ALPRAZolam (XANAX) 0.25 MG tablet Take 1 tablet (0.25 mg total) by mouth at bedtime as needed for anxiety. 5 tablet 0  . amoxicillin (AMOXIL) 500 MG capsule TAKE 4 CAPSULES PO 1 HOUR PRIOR TO DENTAL APPOINTMENT  1  . azelastine (ASTELIN) 0.1 % nasal spray Place 2 sprays into both nostrils 2 (two) times daily as needed  for allergies.     . Cholecalciferol (VITAMIN D) 2000 units CAPS Take 2,000 Units by mouth daily.     Marland Kitchen dicyclomine (BENTYL) 10 MG capsule Take 1 tab by mouth every morning. May take twice daily as needed. (Patient taking differently: Take 10 mg by mouth 2 (two) times daily. ) 60 capsule 6  . diltiazem (DILACOR XR) 180 MG 24 hr capsule Take 180 mg by mouth every morning.    . diltiazem (TIAZAC) 120 MG 24 hr capsule Take 120 mg by mouth daily. At night time    . diphenhydrAMINE (BENADRYL) 25 mg capsule Take 25 mg by mouth daily as needed for itching.     . diphenoxylate-atropine (LOMOTIL) 2.5-0.025 MG tablet Take 1 tablet by mouth 4 (four) times daily. (Patient taking differently: Take 1 tablet by mouth  4 (four) times daily as needed for diarrhea or loose stools. ) 360 tablet 1  . DULoxetine (CYMBALTA) 60 MG capsule Take 1 capsule (60 mg total) by mouth daily. 30 capsule 3  . fluticasone (FLONASE) 50 MCG/ACT nasal spray Place 2 sprays into both nostrils 2 (two) times daily as needed (FOR NASAL CONGESTION.).     Marland Kitchen furosemide (LASIX) 40 MG tablet Take 1 tablet (40 mg total) by mouth daily. 90 tablet 3  . halobetasol (ULTRAVATE) 0.05 % cream Apply 1 application topically 2 (two) times daily as needed (psoriasis).     Marland Kitchen levothyroxine (SYNTHROID, LEVOTHROID) 137 MCG tablet Take 137 mcg by mouth daily before breakfast. For thyroid therapy    . Magnesium 400 MG CAPS Take 400 mg by mouth daily.    . mirtazapine (REMERON) 15 MG tablet TAKE 1 TABLET(15 MG) BY MOUTH AT BEDTIME (Patient taking differently: Take 15 mg by mouth at bedtime. ) 30 tablet 11  . Oxycodone HCl 10 MG TABS Take 1 tablet (10 mg total) by mouth every 6 (six) hours as needed. 60 tablet 0  . polyvinyl alcohol (LIQUIFILM TEARS) 1.4 % ophthalmic solution Place 1 drop into both eyes 2 (two) times daily as needed for dry eyes.     . potassium chloride SA (K-DUR,KLOR-CON) 20 MEQ tablet Take 10 mEq by mouth daily.    Marland Kitchen STIOLTO RESPIMAT 2.5-2.5 MCG/ACT AERS INHALE 2 PUFFS INTO THE LUNGS DAILY 4 g 5  . Ustekinumab (STELARA [place]) Inject 1 Dose into the skin every 3 (three) months.    Alveda Reasons 20 MG TABS tablet TAKE 1 TABLET BY MOUTH EVERY DAY AFTER SUPPER 90 tablet 3  . albuterol (PROAIR HFA) 108 (90 Base) MCG/ACT inhaler Inhale 2 puffs into the lungs every 6 (six) hours as needed for wheezing or shortness of breath. 1 Inhaler 3   No current facility-administered medications for this visit.        Physical Exam: BP 102/62 (BP Location: Left Arm, Patient Position: Sitting, Cuff Size: Large)   Pulse 98   Resp 16   Ht 5' 4"  (1.626 m)   Wt 168 lb (76.2 kg)   SpO2 97% Comment: RA  BMI 28.84 kg/m  General appearance: alert, cooperative and no  distress Resp: clear to auscultation bilaterally Cardio: regular rate and rhythm, S1, S2 normal, no murmur, click, rub or gallop GI: soft, non-tender; bowel sounds normal; no masses,  no organomegaly Extremities: extremities normal, atraumatic, no cyanosis or edema and Homans sign is negative, no sign of DVT Neurologic: Grossly normal   Diagnostic Studies & Laboratory data:     Recent Radiology Findings: Dg Chest 2 View  Result  Date: 10/23/2018 CLINICAL DATA:  Right pleural drain. History of ovarian cancer. Shortness of breath. EXAM: CHEST - 2 VIEW COMPARISON:  09/11/2018. FINDINGS: PowerPort catheter and right pleural drainage catheter in stable position. Tiny right apical pneumothorax again noted. Mild bibasilar atelectasis and/or scarring again noted. No acute infiltrate. Heart size stable. No acute bony abnormality. IMPRESSION: PowerPort catheter right pleural drainage catheter stable position. Tiny right apical pneumothorax again noted without interim change. Mild bibasilar atelectasis and or scarring. No acute abnormality identified. Electronically Signed   By: Marcello Moores  Register   On: 10/23/2018 12:04    I have independently reviewed the above radiology studies  and reviewed the findings with the patient.    Dg Chest 2 View  Result Date: 09/11/2018 CLINICAL DATA:  Shortness of breath. Pleural effusion. EXAM: CHEST - 2 VIEW COMPARISON:  08/14/2018 FINDINGS: Right jugular Port-A-Cath remains in place terminating over the lower SVC, unchanged. A right-sided tunneled pleural catheter catheter is unchanged. The cardiomediastinal silhouette is unchanged with normal heart size. Aortic atherosclerosis is noted. The lungs remain hyperinflated with minimal scarring or atelectasis in the lung bases. No acute airspace consolidation or edema is identified. There is likely a trace right apical pneumothorax, and there is at most a trace right pleural effusion. No acute osseous abnormality is identified.  IMPRESSION: Trace right apical pneumothorax and at most a trace right pleural effusion with right pleural catheter in place. No evidence of acute airspace disease. Electronically Signed   By: Logan Bores M.D.   On: 09/11/2018 14:35    Dg Chest 2 View  Result Date: 08/14/2018 CLINICAL DATA:  Follow-up right pleural effusion.  COPD. EXAM: CHEST - 2 VIEW COMPARISON:  07/16/2018 chest radiograph. FINDINGS: Right basilar chest tube terminates in the medial upper right pleural space, unchanged. Right internal jugular MediPort terminates in the lower third of the SVC. Stable cardiomediastinal silhouette with normal heart size. No convincing pneumothorax. No significant pleural effusion. Hyperinflated lungs. No pulmonary edema. No acute consolidative airspace disease. Stable minimal bibasilar scarring versus atelectasis. IMPRESSION: 1. Stable right chest tube with no appreciable residual pneumothorax. 2. No significant pleural effusion. 3. Stable minimal bibasilar scarring versus atelectasis. 4. Hyperinflated lungs, compatible with the provided history of COPD. Electronically Signed   By: Ilona Sorrel M.D.   On: 08/14/2018 09:40   DCt Abdomen Pelvis W Contrast  Result Date: 07/23/2018 CLINICAL DATA:  Ovarian cancer, diagnosed 07/2015, status post surgery with adjunct chemotherapy. Oral chemotherapy ongoing. Recurrent pleural effusions. EXAM: CT ABDOMEN AND PELVIS WITH CONTRAST TECHNIQUE: Multidetector CT imaging of the abdomen and pelvis was performed using the standard protocol following bolus administration of intravenous contrast. CONTRAST:  131m OMNIPAQUE IOHEXOL 300 MG/ML  SOLN COMPARISON:  04/21/2018 FINDINGS: Lower chest: Trace right pleural effusion with indwelling pleural drain. Hepatobiliary: Liver is within normal limits. Status post cholecystectomy. No intrahepatic ductal dilatation. Dilated common duct, measuring 11 mm, chronic. Pancreas: Within normal limits. Spleen: Within normal limits.  Adrenals/Urinary Tract: Adrenal glands within normal limits. Kidneys are within normal limits.  No hydronephrosis. Bladder is underdistended but unremarkable. Stomach/Bowel: Stomach is within normal limits. No evidence of bowel obstruction. Very mild sigmoid diverticulosis. Vascular/Lymphatic: No evidence of abdominal aortic aneurysm. Atherosclerotic calcifications of the abdominal aorta and branch vessels. No suspicious abdominopelvic lymphadenopathy. Reproductive: Status post hysterectomy and bilateral salpingo-oophorectomy. Other: No abdominopelvic ascites. Mild peritoneal thickening/nodularity on the left (series 2/images 58-59). Mild soft tissue nodularity along the right pelvic cul-de-sac adjacent to the rectum (series 2/image 58). While equivocal, these  findings are worrisome for very mild peritoneal disease. Musculoskeletal: Degenerative changes of the visualized thoracolumbar spine. IMPRESSION: Status post hysterectomy and bilateral salpingo-oophorectomy. Very mild peritoneal thickening/nodularity, equivocal but worrisome for very mild peritoneal disease. Attention on follow-up is suggested. Small right pleural effusion with indwelling pleural drain. Electronically Signed   By: Julian Hy M.D.   On: 07/23/2018 08:37    Recent Lab Findings: Lab Results  Component Value Date   WBC 7.8 10/20/2018   HGB 8.2 (L) 10/20/2018   HCT 26.4 (L) 10/20/2018   PLT 305 10/20/2018   GLUCOSE 116 (H) 10/20/2018   CHOL 147 11/14/2017   ALT 10 10/20/2018   AST 14 (L) 10/20/2018   NA 137 10/20/2018   K 3.5 10/20/2018   CL 99 10/20/2018   CREATININE 1.03 (H) 10/20/2018   BUN 15 10/20/2018   CO2 31 10/20/2018   TSH 0.388 11/10/2017   INR 1.09 06/24/2018   HGBA1C 5.7 (H) 06/12/2013      Assessment / Plan:   Patient has stable controlled malignant effusion on the right with indwelling Pleurx catheter draining between 90 and 190 mL every 4 days.  There is no sign of infection around the catheter.  I  discussed with the patient possibly removing the catheter versus continued drainage as were doing.  She is very concerned that she will have recurrent effusion if we remove it.  We will continue every 4-day drainage, should the drainage stopped before the catheter become infected will be removed immediately. I will see her back in 6 weeks with a follow-up chest x-ray  Grace Isaac MD      [address] [place] [address] Office [phone]   Beeper [phone]  10/23/2018 12:19 PM

## 2018-10-28 ENCOUNTER — Ambulatory Visit (HOSPITAL_COMMUNITY)
Admission: RE | Admit: 2018-10-28 | Discharge: 2018-10-28 | Disposition: A | Discharge status: Home / Self Care | Payer: Medicare Other | Source: Ambulatory Visit | Attending: Hematology and Oncology | Admitting: Hematology and Oncology

## 2018-10-28 ENCOUNTER — Inpatient Hospital Stay: Payer: Medicare Other

## 2018-10-28 ENCOUNTER — Other Ambulatory Visit: Payer: Self-pay

## 2018-10-28 ENCOUNTER — Encounter (HOSPITAL_COMMUNITY): Payer: Medicare Other

## 2018-10-28 DIAGNOSIS — D638 Anemia in other chronic diseases classified elsewhere: Secondary | ICD-10-CM | POA: Diagnosis present

## 2018-10-28 DIAGNOSIS — C562 Malignant neoplasm of left ovary: Secondary | ICD-10-CM | POA: Diagnosis present

## 2018-10-28 DIAGNOSIS — Z5111 Encounter for antineoplastic chemotherapy: Secondary | ICD-10-CM | POA: Diagnosis not present

## 2018-10-28 LAB — COMPREHENSIVE METABOLIC PANEL
ALBUMIN: 2.9 g/dL — AB (ref 3.5–5.0)
ALT: 12 U/L (ref 0–44)
AST: 14 U/L — ABNORMAL LOW (ref 15–41)
Alkaline Phosphatase: 59 U/L (ref 38–126)
Anion gap: 10 (ref 5–15)
BUN: 24 mg/dL — ABNORMAL HIGH (ref 8–23)
CO2: 29 mmol/L (ref 22–32)
Calcium: 8.7 mg/dL — ABNORMAL LOW (ref 8.9–10.3)
Chloride: 98 mmol/L (ref 98–111)
Creatinine, Ser: 1.11 mg/dL — ABNORMAL HIGH (ref 0.44–1.00)
GFR calc Af Amer: 53 mL/min — ABNORMAL LOW (ref >60)
GFR calc non Af Amer: 46 mL/min — ABNORMAL LOW (ref >60)
Glucose, Bld: 138 mg/dL — ABNORMAL HIGH (ref 70–99)
POTASSIUM: 4.1 mmol/L (ref 3.5–5.1)
SODIUM: 137 mmol/L (ref 135–145)
Total Bilirubin: 0.3 mg/dL (ref 0.3–1.2)
Total Protein: 6.3 g/dL — ABNORMAL LOW (ref 6.5–8.1)

## 2018-10-28 LAB — CBC WITH DIFFERENTIAL/PLATELET
ABS IMMATURE GRANULOCYTES: 0.38 10*3/uL — AB (ref 0.00–0.07)
Basophils Absolute: 0 10*3/uL (ref 0.0–0.1)
Basophils Relative: 0 %
Eosinophils Absolute: 0.2 10*3/uL (ref 0.0–0.5)
Eosinophils Relative: 2 %
HCT: 20 % — ABNORMAL LOW (ref 36.0–46.0)
Hemoglobin: 6 g/dL — CL (ref 12.0–15.0)
Immature Granulocytes: 5 %
Lymphocytes Relative: 16 %
Lymphs Abs: 1.3 10*3/uL (ref 0.7–4.0)
MCH: 25.8 pg — ABNORMAL LOW (ref 26.0–34.0)
MCHC: 30 g/dL (ref 30.0–36.0)
MCV: 85.8 fL (ref 80.0–100.0)
Monocytes Absolute: 0.8 10*3/uL (ref 0.1–1.0)
Monocytes Relative: 10 %
NEUTROS ABS: 5.3 10*3/uL (ref 1.7–7.7)
Neutrophils Relative %: 67 %
Platelets: 176 10*3/uL (ref 150–400)
RBC: 2.33 MIL/uL — ABNORMAL LOW (ref 3.87–5.11)
RDW: 16.2 % — ABNORMAL HIGH (ref 11.5–15.5)
WBC: 7.9 10*3/uL (ref 4.0–10.5)
nRBC: 0 % (ref 0.0–0.2)

## 2018-10-28 LAB — SAMPLE TO BLOOD BANK

## 2018-10-28 LAB — PREPARE RBC (CROSSMATCH)

## 2018-10-28 LAB — MAGNESIUM: Magnesium: 1.7 mg/dL (ref 1.7–2.4)

## 2018-10-28 MED ORDER — ACETAMINOPHEN 325 MG PO TABS
650.0000 mg | ORAL_TABLET | Freq: Once | ORAL | Status: AC
  Administered 2018-10-28: 650 mg via ORAL
  Filled 2018-10-28: qty 2

## 2018-10-28 MED ORDER — SODIUM CHLORIDE 0.9% IV SOLUTION
250.0000 mL | Freq: Once | INTRAVENOUS | Status: AC
  Administered 2018-10-28: 250 mL via INTRAVENOUS

## 2018-10-28 MED ORDER — HEPARIN SOD (PORK) LOCK FLUSH 100 UNIT/ML IV SOLN: 250.0000 [IU] | INTRAVENOUS | Status: DC | PRN

## 2018-10-28 MED ORDER — DIPHENHYDRAMINE HCL 25 MG PO CAPS
25.0000 mg | ORAL_CAPSULE | Freq: Once | ORAL | Status: AC
  Administered 2018-10-28: 25 mg via ORAL
  Filled 2018-10-28: qty 1

## 2018-10-28 MED ORDER — SODIUM CHLORIDE 0.9% FLUSH: 3.0000 mL | INTRAVENOUS | Status: DC | PRN

## 2018-10-28 MED ORDER — HEPARIN SOD (PORK) LOCK FLUSH 100 UNIT/ML IV SOLN
500.0000 [IU] | Freq: Every day | INTRAVENOUS | Status: AC | PRN
  Administered 2018-10-28: 500 [IU]
  Filled 2018-10-28: qty 5

## 2018-10-28 MED ORDER — SODIUM CHLORIDE 0.9% FLUSH
10.0000 mL | INTRAVENOUS | Status: AC | PRN
  Administered 2018-10-28: 10 mL

## 2018-10-28 NOTE — Progress Notes (Signed)
PATIENT CARE CENTER NOTE  Diagnosis: Anemia    Provider: Dr. Alvy Bimler   Procedure: 2 units PRBC's    Note: Patient received 2 units of blood. Pre-medications given per order. Tolerated transfusions well with no adverse reaction. Vital signs stable. Discharge instructions given. Patient alert, oriented and ambulatory to wheelchair at discharge.

## 2018-10-28 NOTE — Addendum Note (Signed)
Addended by: Flo Shanks on: 10/28/2018 08:20 AM   Modules accepted: Orders

## 2018-10-28 NOTE — Discharge Instructions (Signed)
Blood Transfusion, Adult A blood transfusion is a procedure in which you are given blood through an IV tube. You may need this procedure because of:  Illness.  Surgery.  Injury. The blood may come from someone else (a donor). You may also be able to donate blood for yourself (autologous blood donation). The blood given in a transfusion is made up of different types of cells. You may get:  Red blood cells. These carry oxygen to the cells in the body.  White blood cells. These help you fight infections.  Platelets. These help your blood to clot.  Plasma. This is the liquid part of your blood. It helps with fluid imbalances. If you have a clotting disorder, you may also get other types of blood products. What happens before the procedure?  You will have a blood test to find out your blood type. The test also finds out what type of blood your body will accept and matches it to the donor type.  If you are going to have a planned surgery, you may be able to donate your own blood. This may be done in case you need a transfusion.  If you have had an allergic reaction to a transfusion in the past, you may be given medicine to help prevent a reaction. This medicine may be given to you by mouth or through an IV.  You will have your temperature, blood pressure, and pulse checked.  Follow instructions from your doctor about what you cannot eat or drink.  Ask your doctor about: ? Changing or stopping your regular medicines. This is important if you take diabetes medicines or blood thinners. ? Taking medicines such as aspirin and ibuprofen. These medicines can thin your blood. Do not take these medicines before your procedure if your doctor tells you not to. What happens during the procedure?  An IV tube will be put into one of your veins.  The bag of donated blood will be attached to your IV tube. Then, the blood will enter through your vein.  Your temperature, blood pressure, and pulse will  be checked regularly during the procedure. This is done to find early signs of a transfusion reaction.  If you have any signs or symptoms of a reaction, your transfusion will be stopped. You may also be given medicine.  When the transfusion is done, your IV tube will be taken out.  Pressure may be applied to the IV site for a few minutes.  A bandage (dressing) will be put on the IV site. The procedure may vary among doctors and hospitals. What happens after the procedure?  Your temperature, blood pressure, heart rate, breathing rate, and blood oxygen level will be checked often.  Your blood may be tested to see how you are responding to the transfusion.  You may be warmed with fluids or blankets. This is done to keep the temperature of your body normal. Summary  A blood transfusion is a procedure in which you are given blood through an IV tube.  The blood may come from someone else (a donor). You may also be able to donate blood for yourself.  If you have had an allergic reaction to a transfusion in the past, you may be given medicine to help prevent a reaction. This medicine may be given to you by mouth or through an IV tube.  Your temperature, blood pressure, heart rate, breathing rate, and blood oxygen level will be checked often.  Your blood may be tested to see   how you are responding to the transfusion. This information is not intended to replace advice given to you by your health care provider. Make sure you discuss any questions you have with your health care provider. Document Released: 12/21/2008 Document Revised: 05/18/2016 Document Reviewed: 05/18/2016 Elsevier Interactive Patient Education  2019 [employer]  

## 2018-10-29 LAB — TYPE AND SCREEN
ABO/RH(D): O POS
Antibody Screen: NEGATIVE
Unit division: 0
Unit division: 0

## 2018-10-29 LAB — BPAM RBC
Blood Product Expiration Date: 202002132359
Blood Product Expiration Date: 202002182359
ISSUE DATE / TIME: 202001210933
ISSUE DATE / TIME: 202001210933
UNIT TYPE AND RH: 5100
Unit Type and Rh: 5100

## 2018-10-30 ENCOUNTER — Encounter (HOSPITAL_COMMUNITY): Payer: Medicare Other

## 2018-11-04 ENCOUNTER — Inpatient Hospital Stay: Payer: Medicare Other

## 2018-11-04 ENCOUNTER — Inpatient Hospital Stay (HOSPITAL_BASED_OUTPATIENT_CLINIC_OR_DEPARTMENT_OTHER): Payer: Medicare Other | Admitting: Hematology and Oncology

## 2018-11-04 ENCOUNTER — Encounter (HOSPITAL_COMMUNITY): Payer: Medicare Other

## 2018-11-04 ENCOUNTER — Telehealth: Payer: Self-pay | Admitting: Hematology and Oncology

## 2018-11-04 ENCOUNTER — Encounter: Payer: Self-pay | Admitting: Hematology and Oncology

## 2018-11-04 VITALS — BP 126/40 | HR 100 | Temp 97.7°F | Resp 17 | Ht 64.0 in | Wt 173.8 lb

## 2018-11-04 VITALS — BP 109/47 | HR 79 | Temp 98.3°F | Resp 16

## 2018-11-04 DIAGNOSIS — I482 Chronic atrial fibrillation, unspecified: Secondary | ICD-10-CM

## 2018-11-04 DIAGNOSIS — C562 Malignant neoplasm of left ovary: Secondary | ICD-10-CM

## 2018-11-04 DIAGNOSIS — Z5111 Encounter for antineoplastic chemotherapy: Secondary | ICD-10-CM

## 2018-11-04 DIAGNOSIS — G893 Neoplasm related pain (acute) (chronic): Secondary | ICD-10-CM

## 2018-11-04 DIAGNOSIS — E440 Moderate protein-calorie malnutrition: Secondary | ICD-10-CM

## 2018-11-04 DIAGNOSIS — D638 Anemia in other chronic diseases classified elsewhere: Secondary | ICD-10-CM

## 2018-11-04 DIAGNOSIS — D531 Other megaloblastic anemias, not elsewhere classified: Secondary | ICD-10-CM

## 2018-11-04 DIAGNOSIS — J90 Pleural effusion, not elsewhere classified: Secondary | ICD-10-CM

## 2018-11-04 DIAGNOSIS — D61818 Other pancytopenia: Secondary | ICD-10-CM | POA: Diagnosis not present

## 2018-11-04 DIAGNOSIS — N183 Chronic kidney disease, stage 3 (moderate): Secondary | ICD-10-CM

## 2018-11-04 DIAGNOSIS — M549 Dorsalgia, unspecified: Secondary | ICD-10-CM

## 2018-11-04 DIAGNOSIS — Z79899 Other long term (current) drug therapy: Secondary | ICD-10-CM

## 2018-11-04 LAB — CBC WITH DIFFERENTIAL/PLATELET
Abs Immature Granulocytes: 0.11 10*3/uL — ABNORMAL HIGH (ref 0.00–0.07)
Basophils Absolute: 0 10*3/uL (ref 0.0–0.1)
Basophils Relative: 0 %
Eosinophils Absolute: 0.2 10*3/uL (ref 0.0–0.5)
Eosinophils Relative: 3 %
HCT: 24.9 % — ABNORMAL LOW (ref 36.0–46.0)
HEMOGLOBIN: 7.7 g/dL — AB (ref 12.0–15.0)
Immature Granulocytes: 2 %
Lymphocytes Relative: 16 %
Lymphs Abs: 1.1 10*3/uL (ref 0.7–4.0)
MCH: 25.8 pg — ABNORMAL LOW (ref 26.0–34.0)
MCHC: 30.9 g/dL (ref 30.0–36.0)
MCV: 83.6 fL (ref 80.0–100.0)
Monocytes Absolute: 0.8 10*3/uL (ref 0.1–1.0)
Monocytes Relative: 12 %
Neutro Abs: 4.5 10*3/uL (ref 1.7–7.7)
Neutrophils Relative %: 67 %
Platelets: 240 10*3/uL (ref 150–400)
RBC: 2.98 MIL/uL — ABNORMAL LOW (ref 3.87–5.11)
RDW: 16.6 % — ABNORMAL HIGH (ref 11.5–15.5)
WBC: 6.7 10*3/uL (ref 4.0–10.5)
nRBC: 0 % (ref 0.0–0.2)

## 2018-11-04 LAB — COMPREHENSIVE METABOLIC PANEL
ALT: 11 U/L (ref 0–44)
AST: 11 U/L — AB (ref 15–41)
Albumin: 2.9 g/dL — ABNORMAL LOW (ref 3.5–5.0)
Alkaline Phosphatase: 62 U/L (ref 38–126)
Anion gap: 10 (ref 5–15)
BUN: 19 mg/dL (ref 8–23)
CO2: 28 mmol/L (ref 22–32)
Calcium: 8.8 mg/dL — ABNORMAL LOW (ref 8.9–10.3)
Chloride: 100 mmol/L (ref 98–111)
Creatinine, Ser: 1.03 mg/dL — ABNORMAL HIGH (ref 0.44–1.00)
GFR calc Af Amer: 58 mL/min — ABNORMAL LOW (ref >60)
GFR, EST NON AFRICAN AMERICAN: 50 mL/min — AB (ref >60)
Glucose, Bld: 145 mg/dL — ABNORMAL HIGH (ref 70–99)
Potassium: 3.9 mmol/L (ref 3.5–5.1)
Sodium: 138 mmol/L (ref 135–145)
Total Bilirubin: <0.2 mg/dL — ABNORMAL LOW (ref 0.3–1.2)
Total Protein: 6.4 g/dL — ABNORMAL LOW (ref 6.5–8.1)

## 2018-11-04 LAB — SAMPLE TO BLOOD BANK

## 2018-11-04 LAB — PREPARE RBC (CROSSMATCH)

## 2018-11-04 LAB — MAGNESIUM: Magnesium: 1.8 mg/dL (ref 1.7–2.4)

## 2018-11-04 MED ORDER — PROCHLORPERAZINE MALEATE 10 MG PO TABS
ORAL_TABLET | ORAL | Status: AC
  Filled 2018-11-04: qty 1

## 2018-11-04 MED ORDER — SODIUM CHLORIDE 0.9 % IV SOLN
Freq: Once | INTRAVENOUS | Status: AC
  Administered 2018-11-04: 09:00:00 via INTRAVENOUS
  Filled 2018-11-04: qty 250

## 2018-11-04 MED ORDER — SODIUM CHLORIDE 0.9 % IV SOLN
400.0000 mg/m2 | Freq: Once | INTRAVENOUS | Status: AC
  Administered 2018-11-04: 760 mg via INTRAVENOUS
  Filled 2018-11-04: qty 19.99

## 2018-11-04 MED ORDER — DIPHENHYDRAMINE HCL 25 MG PO CAPS
ORAL_CAPSULE | ORAL | Status: AC
  Filled 2018-11-04: qty 1

## 2018-11-04 MED ORDER — PROCHLORPERAZINE MALEATE 10 MG PO TABS
10.0000 mg | ORAL_TABLET | Freq: Once | ORAL | Status: AC
  Administered 2018-11-04: 10 mg via ORAL

## 2018-11-04 MED ORDER — SODIUM CHLORIDE 0.9% FLUSH
10.0000 mL | INTRAVENOUS | Status: DC | PRN
  Filled 2018-11-04: qty 10

## 2018-11-04 MED ORDER — HEPARIN SOD (PORK) LOCK FLUSH 100 UNIT/ML IV SOLN
500.0000 [IU] | Freq: Once | INTRAVENOUS | Status: AC | PRN
  Administered 2018-11-04: 500 [IU]
  Filled 2018-11-04: qty 5

## 2018-11-04 MED ORDER — SODIUM CHLORIDE 0.9% FLUSH
10.0000 mL | Freq: Once | INTRAVENOUS | Status: AC
  Administered 2018-11-04: 10 mL
  Filled 2018-11-04: qty 10

## 2018-11-04 MED ORDER — SODIUM CHLORIDE 0.9% FLUSH
10.0000 mL | INTRAVENOUS | Status: DC | PRN
  Administered 2018-11-04: 10 mL
  Filled 2018-11-04: qty 10

## 2018-11-04 MED ORDER — ACETAMINOPHEN 325 MG PO TABS
ORAL_TABLET | ORAL | Status: AC
  Filled 2018-11-04: qty 2

## 2018-11-04 MED ORDER — ACETAMINOPHEN 325 MG PO TABS
650.0000 mg | ORAL_TABLET | Freq: Once | ORAL | Status: AC
  Administered 2018-11-04: 650 mg via ORAL

## 2018-11-04 MED ORDER — DIPHENHYDRAMINE HCL 25 MG PO CAPS
25.0000 mg | ORAL_CAPSULE | Freq: Once | ORAL | Status: AC
  Administered 2018-11-04: 25 mg via ORAL

## 2018-11-04 NOTE — Progress Notes (Signed)
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  She agree with the plan of care  Anemia, chronic disease She has received blood transfusion recently She denies signs of bleeding She will continue her treatment as schedule and return next week for blood count monitoring and transfusion as needed, to keep hemoglobin greater than 8 We discussed some of the risks, benefits, and alternatives of blood transfusions. The patient is symptomatic from anemia and the hemoglobin level is critically low.  Some of the side-effects to be expected including risks of transfusion reactions, chills, infection, syndrome of volume overload and risk of hospitalization from various reasons and the patient is willing to proceed and went ahead to sign consent today. She will receive 1 unit of blood today  Pleural effusion She has persistent reduced breath sounds on lung bases.  She will continue close monitoring.  Protein-calorie malnutrition, moderate ([place]) She has mild progressive low protein secondary to recurrence of disease She will continue nutritional supplement as tolerated   Orders Placed This Encounter  Procedures  . Type and screen    Standing Status:   Future    Number  of Occurrences:   1    Standing Expiration Date:   11/05/2019  . Prepare RBC    Standing Status:   Standing    Number of Occurrences:   1    Order Specific Question:   # of Units    Answer:   1 unit    Order Specific Question:   Transfusion Indications    Answer:   Symptomatic Anemia    Order Specific Question:   Special Requirements    Answer:   Irradiated    Order Specific Question:   If emergent release call blood bank    Answer:   Not emergent release    INTERVAL HISTORY: Please see below for problem oriented charting. She returns with her daughter for further follow-up She denies significant shortness of breath or dizziness No chest pain Denies ankle swelling No recent cough Her abdominal pain has resolved and she rarely takes pain medicine anymore She has occasional intermittent flare of her inflammatory bowel disease The patient denies any recent signs or symptoms of bleeding such as spontaneous epistaxis, hematuria or hematochezia.   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 Interim finding of moderate ascites within the abdomen and pelvis with additional finding of diffuse nodular infiltration of the omentum and anterior mesenteric fat, the appearance would be consistent with peritoneal carcinomatosis/metastatic disease. Increasing retroperitoneal and upper abdominal adenopathy. 2. Re- demonstrated 3.8 cm cyst in the right adnexa. Enlarging soft tissue  density in the left adnexa now with possible cystic component posteriorly. In light of the above findings, concern is for ovarian neoplasm. Correlation with pelvic ultrasound recommended. 3. Small right-sided pleural effusion, new since prior study 4. Stable hypodense splenic lesions since 2017.     06/25/2017 Imaging    US pelvis: 2.9 cm simple appearing cyst in the right ovary. Left ovary grossly unremarkable. Large volume ascites in the pelvis    06/30/2017 - 07/01/2017 Hospital Admission    She was admitted for evaluation of abdominal pain and ascites    07/01/2017 Pathology Results    PERITONEAL/ASCITIC FLUID(SPECIMEN 1 OF 1 COLLECTED 07/01/17): - POORLY DIFFERENTIATED CARCINOMA; SEE COMMENT Source Peritoneal/Ascitic Fluid, (specimen 1 of 1 collected 07/01/17) Gross Specimen: Received is/are 1000 cc's of brownish fluid. (BS:bs) Prepared: # Smears: 0 # Concentration Technique Slides (i.e. ThinPrep): 1 # Cell Block: 1 Additional Studies: Also received Hematology slide - [id number]. Comment The tumor cells are positive for cytokeratin 7 and Pax-8 but negative for cytokeratin 20, CDX-2, GATA-3, Napsin-A and TTF-1. Based on the immunoprofile a gynecology primary is favored    07/01/2017 Procedure    Successful ultrasound-guided diagnostic and therapeutic paracentesis yielding 2.5 liters of peritoneal fluid    07/07/2017 - 07/09/2017 Hospital Admission    She was admitted for management of malignant ascites    07/08/2017 Procedure    Successful ultrasound-guided therapeutic paracentesis yielding 2.7 liters liters of peritoneal fluid    07/12/2017 Procedure    Successful ultrasound-guided paracentesis yielding 1450 mL of peritoneal fluid    07/18/2017 - 07/24/2017 Hospital Admission    She was admitted for expedited treatment    07/18/2017 Tumor Marker    Patient's tumor was tested for the following markers: CA125 Results of the tumor marker test revealed 1941    07/19/2017 - 02/04/2018  Chemotherapy    The patient had 6 cycles of carboplatin & Taxol for chemotherapy treatment, followed by 3 more cycles of carboplatin only     07/19/2017 - 02/04/2018 Chemotherapy    The patient had carboplatin and taxol    08/06/2017 Procedure    Successful ultrasound-guided therapeutic paracentesis yielding 2.6 liters of peritoneal fluid.    08/09/2017 Tumor Marker    Patient's tumor was tested for the following markers: CA125 Results of the tumor marker test revealed 1665    08/15/2017 Tumor Marker    Patient's tumor was tested for the following markers: CA125 Results of the tumor marker test revealed 937.9    08/20/2017 Imaging    ECHO: Normal LV size with EF 60-65%. Normal RV size and systolic function. No significant valvular abnormalities.    09/18/2017 Imaging    Chest Impression:  1. No evidence thoracic metastasis. 2. Interval increase and RIGHT pleural effusion.  Abdomen / Pelvis Impression:  1. Interval decrease in intraperitoneal free fluid. 2. Interval decrease in omental nodularity in the LEFT ventral peritoneal space. 3. Interval decrease in nodularity associated with the LEFT ovary. 4. Cystic portion of the RIGHT ovary is increased mildly in size.    09/20/2017 Tumor Marker    Patient's tumor was tested for the following markers:  CA125 Results of the tumor marker test revealed 347    10/14/2017 Tumor Marker    Patient's tumor was tested for the following markers: CA125 Results of the tumor marker test revealed 307.4    11/04/2017 Tumor Marker    Patient's tumor was tested for the following markers: CA125 Results of the tumor marker test revealed 262.5    11/28/2017 Imaging    1. Interval decrease in right pleural effusion with resolution of right atelectasis seen previously. 2. New small left pleural effusion, symmetric to the right. 3. No intraperitoneal free fluid on the current study. 4. Continued further decrease in left omental disease, appearing less  confluent today than on the prior study. 5. Left ovary remains normal in appearance today and the right adnexal cystic lesion is decreased in size compared to prior study. 6. 14 mm pancreatic cyst is unchanged. Continued attention on follow-up imaging recommended. 7. Aortic Atherosclerois (ICD10-170.0)    12/13/2017 Tumor Marker    Patient's tumor was tested for the following markers: CA125 Results of the tumor marker test revealed 197.7    01/03/2018 Tumor Marker    Patient's tumor was tested for the following markers: CA125 Results of the tumor marker test revealed 183.1    01/14/2018 Tumor Marker    Patient's tumor was tested for the following markers: CA125 Results of the tumor marker test revealed 177.4    02/04/2018 Tumor Marker    Patient's tumor was tested for the following markers: CA125 Results of the tumor marker test revealed 168.5    02/25/2018 Imaging    1. Omental carcinomatosis appears qualitatively stable to slightly decreased. Stable mild peritoneal thickening in the paracolic gutters. Stable right adnexal cyst. No ascites. No new or progressive metastatic disease in the abdomen or pelvis. 2. Small dependent right pleural effusion is increased. 3. Cystic pancreatic body lesion is decreased and now subcentimeter in size, suggesting a benign lesion. 4. Aortic Atherosclerosis (ICD10-I70.0).    03/03/2018 - 03/07/2018 Hospital Admission    She was hospitalized for GI bleed requiring blood transfusions. Xarelto was placed on hold    03/07/2018 PET scan    1. Persistent hazy omental interstitial nodularity but no hypermetabolism or discrete measurable nodules. No abdominal ascites. 2. No findings for metastatic disease involving the chest. 3. Moderate-sized right pleural effusion and small left pleural effusion.     03/20/2018 Pathology Results    1. Ovary and fallopian tube, right - OVARY AND FALLOPIAN TUBE INVOLVED BY SEROUS CARCINOMA. - PARATUBAL CYST. 2. Uterus +/-  tubes/ovaries, neoplastic, cervix, left ovary and fallopian tube - LEFT OVARY: HIGH GRADE SEROUS CARCINOMA WITH TREATMENT EFFECT, SPANNING 2.5 CM. CARCINOMA INVOLVES OVARIAN SURFACE. SEE ONCOLOGY TABLE. - LEFT FALLOPIAN TUBE: INVOLVED BY SEROUS CARCINOMA. - UTERUS: -ENDOMETRIUM: INACTIVE ENDOMETRIUM. NO HYPERPLASIA OR MALIGNANCY. -MYOMETRIUM: UNREMARKABLE. NO MALIGNANCY. -SEROSA: INVOLVED BY SEROUS CARCINOMA. - CERVIX: ENDOCERVICAL POLYP. NO MALIGNANCY. 3. Omentum, resection for tumor - INVOLVED BY SEROUS CARCINOMA. 4. Soft tissue, biopsy, mesenteric nodule - INVOLVED BY SEROUS CARCINOMA. Microscopic Comment 2. OVARY or FALLOPIAN TUBE or PRIMARY PERITONEUM: Procedure: Total hysterectomy and bilateral salpingo-oophorectomy. Omentectomy. Mesenteric lymph node biopsy. Specimen Integrity: Intact. Tumor Site: Left ovary. Ovarian Surface Involvement (required only if applicable): Present. Fallopian Tube Surface Involvement (required only if applicable): Present, bilateral. Tumor Size: 2.5 cm. Histologic Type: High grade serous carcinoma. Histologic Grade: High grade. Implants (required for advanced stage serous/seromucinous borderline tumors only): N/A. Other Tissue/ Organ Involvement: Bilateral fallopian tubes, right ovary, uterine serosa, omentum. Largest Extrapelvic Peritoneal  Focus (required only if applicable): Microscopic, estimated 0.5 cm (omentum). Peritoneal/Ascitic Fluid: Prior Positive ([id number]). Treatment Effect (required only for high-grade serous carcinomas): Present in left ovary. CRS2. Regional Lymph Nodes: No lymph nodes submitted/identified. Pathologic Stage Classification (pTNM, AJCC 8th Edition): ypT3b, ypNX Representative Tumor Block: 1A, 1B, 16F, 2F. Comment(s): The right ovary has only surface deposits with a large paratubal cyst. The left ovary has intraparenchymal tumor with associated treatment effect. Thus the tumor location is classified as a left ovarian  primary.    03/20/2018 Surgery    Procedure(s) Performed:  1. Exploratory laparotomy with total hysterectomy and bilateral salpingo-oophorectomy 2. Infragastic Omentectomy  3. Debulking to <1cm gross residual disease   Surgeon: Mart Piggs, MD  Specimens: Uterus Cervix, Bilateral tubes / ovaries and omentum. Mesenteric nodule.  Operative Findings: Debulked to gross residual disease <1cm; however there is miliary disease in multiple locations including the majority of the abdominal peritoneum (anterior abdominal wall, bilateral gutters), diaphragm (Right>left), majority of small bowel mesentary. Normal appendix. Normal small uterus. Right ovary with a cystic lesion ~3cm, some adhesive disease of right adnexa to rectum/sigmoid. Gross omental disease, which was resected with the omentectomy. Smooth liver surface, but again, diaphragmatic disease noted.       03/20/2018 Genetic Testing    Patient has genetic testing done for ER/PR. Results revealed patient has ER: 90%, PR 0%.     03/31/2018 Tumor Marker    Patient's tumor was tested for the following markers: CA125 Results of the tumor marker test revealed 215.8     Genetic Testing    Patient has genetic testing done for BRCA 1. Results revealed patient has the following: BRCA 1: no loss of expression.     Genetic Testing    Patient has genetic testing done for MMR . Results revealed patient has the following:  MMR: normal    03/31/2018 Genetic Testing    Patient has genetic testing done for BRCA1/2. Results revealed patient has no actionable mutations. She is found to have [place] genetic change of unknown significance    04/21/2018 Imaging    1. No definite findings of residual or recurrent metastatic disease in the abdomen or pelvis status post interval TAHBSO and omentectomy. Stable minimal thickening in the paracolic gutters without discrete peritoneal nodularity. 2. Trace free fluid in the pelvic cul-de-sac. 3.  Stable small dependent right pleural effusion. 4. Subcentimeter pancreatic body cystic lesion is stable to slightly decreased. 5. Aortic Atherosclerosis (ICD10-I70.0).    04/21/2018 Tumor Marker    Patient's tumor was tested for the following markers: CA125 Results of the tumor marker test revealed 187.3    04/23/2018 - 09/12/2018 Anti-estrogen oral therapy    She is placed on Femara    05/08/2018 Procedure    Successful ultrasound guided right thoracentesis yielding 800 mL of pleural fluid. Fluid cytology is negative for malignancy     05/28/2018 Tumor Marker    Patient's tumor was tested for the following markers: CA125 Results of the tumor marker test revealed 190    06/30/2018 Tumor Marker    Patient's tumor was tested for the following markers: CA125 Results of the tumor marker test revealed 148    07/23/2018 Imaging    Status post hysterectomy and bilateral salpingo-oophorectomy.  Very mild peritoneal thickening/nodularity, equivocal but worrisome for very mild peritoneal disease. Attention on follow-up is suggested.  Small right pleural effusion with indwelling pleural drain.    07/23/2018 Tumor Marker    Patient's tumor was tested for  the following markers: CA125 Results of the tumor marker test revealed 215    09/11/2018 Tumor Marker    Patient's tumor was tested for the following markers: CA-125. Results of the tumor marker test revealed 1323    09/12/2018 Imaging    1. Mildly progressive ascites with some mild degree of nodularity most appreciable along the left adnexa, right paracolic gutter, right upper quadrant, compatible with peritoneal spread of tumor. 2. Small right pleural effusion with indwelling right pleural catheter again noted. Mild increase in atelectasis anteriorly at the right lung base. 3. Aortic Atherosclerosis (ICD10-I70.0). Coronary atherosclerosis. 4. Several tiny hypodense lesions in the spleen are technically nonspecific, but stable. 5. Air  fluid level in the rectum compatible with diarrheal process. 6. Prominent bilateral hip arthropathy. Left hip screw noted.    09/22/2018 -  Chemotherapy    The patient had carboplatin and gemzar    09/29/2018 Tumor Marker    Patient's tumor was tested for the following markers: CA125 Results of the tumor marker test revealed 1219    09/29/2018 Adverse Reaction    Cycle 1 day 8 of Gemzar was omitted due to severe anemia    10/20/2018 Tumor Marker    Patient's tumor was tested for the following markers: CA125 Results of the tumor marker test revealed 402     REVIEW OF SYSTEMS:   Constitutional: Denies fevers, chills or abnormal weight loss Eyes: Denies blurriness of vision Ears, nose, mouth, throat, and face: Denies mucositis or sore throat Respiratory: Denies cough, dyspnea or wheezes Cardiovascular: Denies palpitation, chest discomfort or lower extremity swelling Gastrointestinal:  Denies nausea, heartburn or change in bowel habits Skin: Denies abnormal skin rashes Lymphatics: Denies new lymphadenopathy or easy bruising Neurological:Denies numbness, tingling or new weaknesses Behavioral/Psych: Mood is stable, no new changes  All other systems were reviewed with the patient and are negative.  I have reviewed the past medical history, past surgical history, social history and family history with the patient and they are unchanged from previous note.  ALLERGIES:  is allergic to codeine.  MEDICATIONS:  Current Outpatient Medications  Medication Sig Dispense Refill  . albuterol (PROAIR HFA) 108 (90 Base) MCG/ACT inhaler Inhale 2 puffs into the lungs every 6 (six) hours as needed for wheezing or shortness of breath. 1 Inhaler 3  . ALPRAZolam (XANAX) 0.25 MG tablet Take 1 tablet (0.25 mg total) by mouth at bedtime as needed for anxiety. 5 tablet 0  . amoxicillin (AMOXIL) 500 MG capsule TAKE 4 CAPSULES PO 1 HOUR PRIOR TO DENTAL APPOINTMENT  1  . azelastine (ASTELIN) 0.1 % nasal  spray Place 2 sprays into both nostrils 2 (two) times daily as needed for allergies.     . Cholecalciferol (VITAMIN D) 2000 units CAPS Take 2,000 Units by mouth daily.     Marland Kitchen dicyclomine (BENTYL) 10 MG capsule Take 1 tab by mouth every morning. May take twice daily as needed. (Patient taking differently: Take 10 mg by mouth 2 (two) times daily. ) 60 capsule 6  . diltiazem (DILACOR XR) 180 MG 24 hr capsule Take 180 mg by mouth every morning.    . diltiazem (TIAZAC) 120 MG 24 hr capsule Take 120 mg by mouth daily. At night time    . diphenhydrAMINE (BENADRYL) 25 mg capsule Take 25 mg by mouth daily as needed for itching.     . diphenoxylate-atropine (LOMOTIL) 2.5-0.025 MG tablet Take 1 tablet by mouth 4 (four) times daily. (Patient taking differently: Take 1 tablet by mouth  4 (four) times daily as needed for diarrhea or loose stools. ) 360 tablet 1  . DULoxetine (CYMBALTA) 60 MG capsule Take 1 capsule (60 mg total) by mouth daily. 30 capsule 3  . fluticasone (FLONASE) 50 MCG/ACT nasal spray Place 2 sprays into both nostrils 2 (two) times daily as needed (FOR NASAL CONGESTION.).     Marland Kitchen furosemide (LASIX) 40 MG tablet Take 1 tablet (40 mg total) by mouth daily. 90 tablet 3  . halobetasol (ULTRAVATE) 0.05 % cream Apply 1 application topically 2 (two) times daily as needed (psoriasis).     Marland Kitchen levothyroxine (SYNTHROID, LEVOTHROID) 137 MCG tablet Take 137 mcg by mouth daily before breakfast. For thyroid therapy    . Magnesium 400 MG CAPS Take 400 mg by mouth daily.    . mirtazapine (REMERON) 15 MG tablet TAKE 1 TABLET(15 MG) BY MOUTH AT BEDTIME (Patient taking differently: Take 15 mg by mouth at bedtime. ) 30 tablet 11  . Oxycodone HCl 10 MG TABS Take 1 tablet (10 mg total) by mouth every 6 (six) hours as needed. 60 tablet 0  . polyvinyl alcohol (LIQUIFILM TEARS) 1.4 % ophthalmic solution Place 1 drop into both eyes 2 (two) times daily as needed for dry eyes.     . potassium chloride SA (K-DUR,KLOR-CON) 20 MEQ  tablet Take 10 mEq by mouth daily.    Marland Kitchen STIOLTO RESPIMAT 2.5-2.5 MCG/ACT AERS INHALE 2 PUFFS INTO THE LUNGS DAILY 4 g 5  . Ustekinumab (STELARA [place]) Inject 1 Dose into the skin every 3 (three) months.    Alveda Reasons 20 MG TABS tablet TAKE 1 TABLET BY MOUTH EVERY DAY AFTER SUPPER 90 tablet 3   No current facility-administered medications for this visit.    Facility-Administered Medications Ordered in Other Visits  Medication Dose Route Frequency Provider Last Rate Last Dose  . gemcitabine (GEMZAR) 760 mg in sodium chloride 0.9 % 100 mL chemo infusion  400 mg/m2 (Treatment Plan Recorded) Intravenous Once Alvy Bimler, Tyus Kallam, MD      . heparin lock flush 100 unit/mL  500 Units Intracatheter Once PRN Alvy Bimler, Fayola Meckes, MD      . prochlorperazine (COMPAZINE) tablet 10 mg  10 mg Oral Once Alvy Bimler, Zada Haser, MD      . sodium chloride flush (NS) 0.9 % injection 10 mL  10 mL Intracatheter PRN Alvy Bimler, Hayven Fatima, MD        PHYSICAL EXAMINATION: ECOG PERFORMANCE STATUS: 2 - Symptomatic, <50% confined to bed  Vitals:   11/04/18 0818  BP: (!) 126/40  Pulse: 100  Resp: 17  Temp: 97.7 F (36.5 C)  SpO2: 96%   Filed Weights   11/04/18 0818  Weight: 173 lb 12.8 oz (78.8 kg)    GENERAL:alert, no distress and comfortable SKIN: skin color Is pale, texture, turgor are normal, no rashes or significant lesions EYES: normal, Conjunctiva are pale and non-injected, sclera clear OROPHARYNX:no exudate, no erythema and lips, buccal mucosa, and tongue normal  NECK: supple, thyroid normal size, non-tender, without nodularity LYMPH:  no palpable lymphadenopathy in the cervical, axillary or inguinal LUNGS: She has reduced breath sounds on both lung bases with normal breathing effort HEART: regular rate & rhythm and no murmurs and no lower extremity edema ABDOMEN:abdomen soft, non-tender and normal bowel sounds Musculoskeletal:no cyanosis of digits and no clubbing  NEURO: alert & oriented x 3 with fluent speech, no focal motor/sensory  deficits  LABORATORY DATA:  I have reviewed the data as listed    Component Value Date/Time  NA 138 11/04/2018 0751   NA 135 (L) 09/24/2017 0945   K 3.9 11/04/2018 0751   K 3.7 09/24/2017 0945   CL 100 11/04/2018 0751   CO2 28 11/04/2018 0751   CO2 30 (H) 09/24/2017 0945   GLUCOSE 145 (H) 11/04/2018 0751   GLUCOSE 101 09/24/2017 0945   BUN 19 11/04/2018 0751   BUN 15.3 09/24/2017 0945   CREATININE 1.03 (H) 11/04/2018 0751   CREATININE 1.00 03/13/2018 0940   CREATININE 0.8 09/24/2017 0945   CALCIUM 8.8 (L) 11/04/2018 0751   CALCIUM 9.1 09/24/2017 0945   PROT 6.4 (L) 11/04/2018 0751   PROT 6.6 09/24/2017 0945   ALBUMIN 2.9 (L) 11/04/2018 0751   ALBUMIN 3.3 (L) 09/24/2017 0945   AST 11 (L) 11/04/2018 0751   AST 14 03/13/2018 0940   AST 16 09/24/2017 0945   ALT 11 11/04/2018 0751   ALT 7 03/13/2018 0940   ALT 12 09/24/2017 0945   ALKPHOS 62 11/04/2018 0751   ALKPHOS 52 09/24/2017 0945   BILITOT <0.2 (L) 11/04/2018 0751   BILITOT 0.3 03/13/2018 0940   BILITOT 1.13 09/24/2017 0945   GFRNONAA 50 (L) 11/04/2018 0751   GFRNONAA 51 (L) 03/13/2018 0940   GFRAA 58 (L) 11/04/2018 0751   GFRAA 59 (L) 03/13/2018 0940    No results found for: SPEP, UPEP  Lab Results  Component Value Date   WBC 6.7 11/04/2018   NEUTROABS 4.5 11/04/2018   HGB 7.7 (L) 11/04/2018   HCT 24.9 (L) 11/04/2018   MCV 83.6 11/04/2018   PLT 240 11/04/2018      Chemistry      Component Value Date/Time   NA 138 11/04/2018 0751   NA 135 (L) 09/24/2017 0945   K 3.9 11/04/2018 0751   K 3.7 09/24/2017 0945   CL 100 11/04/2018 0751   CO2 28 11/04/2018 0751   CO2 30 (H) 09/24/2017 0945   BUN 19 11/04/2018 0751   BUN 15.3 09/24/2017 0945   CREATININE 1.03 (H) 11/04/2018 0751   CREATININE 1.00 03/13/2018 0940   CREATININE 0.8 09/24/2017 0945      Component Value Date/Time   CALCIUM 8.8 (L) 11/04/2018 0751   CALCIUM 9.1 09/24/2017 0945   ALKPHOS 62 11/04/2018 0751   ALKPHOS 52 09/24/2017 0945    AST 11 (L) 11/04/2018 0751   AST 14 03/13/2018 0940   AST 16 09/24/2017 0945   ALT 11 11/04/2018 0751   ALT 7 03/13/2018 0940   ALT 12 09/24/2017 0945   BILITOT <0.2 (L) 11/04/2018 0751   BILITOT 0.3 03/13/2018 0940   BILITOT 1.13 09/24/2017 0945       RADIOGRAPHIC STUDIES: I have personally reviewed the radiological images as listed and agreed with the findings in the report. Dg Chest 2 View  Result Date: 10/23/2018 CLINICAL DATA:  Right pleural drain. History of ovarian cancer. Shortness of breath. EXAM: CHEST - 2 VIEW COMPARISON:  09/11/2018. FINDINGS: PowerPort catheter and right pleural drainage catheter in stable position. Tiny right apical pneumothorax again noted. Mild bibasilar atelectasis and/or scarring again noted. No acute infiltrate. Heart size stable. No acute bony abnormality. IMPRESSION: PowerPort catheter right pleural drainage catheter stable position. Tiny right apical pneumothorax again noted without interim change. Mild bibasilar atelectasis and or scarring. No acute abnormality identified. Electronically Signed   By: Marcello Moores  Register   On: 10/23/2018 12:04    All questions were answered. The patient knows to call the clinic with any problems, questions or concerns. No barriers  to learning was detected.  I spent 30 minutes counseling the patient face to face. The total time spent in the appointment was 40 minutes and more than 50% was on counseling and review of test results  Heath Lark, MD 11/04/2018 9:11 AM

## 2018-11-04 NOTE — Assessment & Plan Note (Signed)
She has persistent reduced breath sounds on lung bases.  She will continue close monitoring.

## 2018-11-04 NOTE — Assessment & Plan Note (Signed)
She has received blood transfusion recently She denies signs of bleeding She will continue her treatment as schedule and return next week for blood count monitoring and transfusion as needed, to keep hemoglobin greater than 8 We discussed some of the risks, benefits, and alternatives of blood transfusions. The patient is symptomatic from anemia and the hemoglobin level is critically low.  Some of the side-effects to be expected including risks of transfusion reactions, chills, infection, syndrome of volume overload and risk of hospitalization from various reasons and the patient is willing to proceed and went ahead to sign consent today. She will receive 1 unit of blood today

## 2018-11-04 NOTE — Assessment & Plan Note (Signed)
She has mild progressive low protein secondary to recurrence of disease She will continue nutritional supplement as tolerated

## 2018-11-04 NOTE — Telephone Encounter (Signed)
Gave avs and calendar waiting on patient to cancel 2/24  In order to schedule

## 2018-11-04 NOTE — Assessment & Plan Note (Signed)
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  She agree with the plan of care

## 2018-11-05 LAB — BPAM RBC
Blood Product Expiration Date: 202002252359
ISSUE DATE / TIME: 202001281028
Unit Type and Rh: 5100

## 2018-11-05 LAB — TYPE AND SCREEN
ABO/RH(D): O POS
Antibody Screen: NEGATIVE
Unit division: 0

## 2018-11-06 ENCOUNTER — Encounter (HOSPITAL_COMMUNITY): Payer: Medicare Other

## 2018-11-08 ENCOUNTER — Other Ambulatory Visit: Payer: Self-pay | Admitting: Physician Assistant

## 2018-11-11 ENCOUNTER — Encounter (HOSPITAL_COMMUNITY): Payer: Medicare Other

## 2018-11-13 ENCOUNTER — Inpatient Hospital Stay: Payer: Medicare Other

## 2018-11-13 ENCOUNTER — Encounter (HOSPITAL_COMMUNITY): Payer: Medicare Other

## 2018-11-13 ENCOUNTER — Inpatient Hospital Stay: Payer: Medicare Other | Attending: Hematology and Oncology

## 2018-11-13 DIAGNOSIS — Z5111 Encounter for antineoplastic chemotherapy: Secondary | ICD-10-CM | POA: Diagnosis present

## 2018-11-13 DIAGNOSIS — C562 Malignant neoplasm of left ovary: Secondary | ICD-10-CM

## 2018-11-13 DIAGNOSIS — Z90722 Acquired absence of ovaries, bilateral: Secondary | ICD-10-CM | POA: Insufficient documentation

## 2018-11-13 DIAGNOSIS — Z90710 Acquired absence of both cervix and uterus: Secondary | ICD-10-CM | POA: Diagnosis not present

## 2018-11-13 DIAGNOSIS — G893 Neoplasm related pain (acute) (chronic): Secondary | ICD-10-CM | POA: Insufficient documentation

## 2018-11-13 DIAGNOSIS — I129 Hypertensive chronic kidney disease with stage 1 through stage 4 chronic kidney disease, or unspecified chronic kidney disease: Secondary | ICD-10-CM | POA: Diagnosis not present

## 2018-11-13 DIAGNOSIS — I482 Chronic atrial fibrillation, unspecified: Secondary | ICD-10-CM | POA: Insufficient documentation

## 2018-11-13 DIAGNOSIS — D638 Anemia in other chronic diseases classified elsewhere: Secondary | ICD-10-CM

## 2018-11-13 DIAGNOSIS — D631 Anemia in chronic kidney disease: Secondary | ICD-10-CM | POA: Insufficient documentation

## 2018-11-13 DIAGNOSIS — R188 Other ascites: Secondary | ICD-10-CM | POA: Diagnosis not present

## 2018-11-13 DIAGNOSIS — N183 Chronic kidney disease, stage 3 (moderate): Secondary | ICD-10-CM | POA: Diagnosis not present

## 2018-11-13 DIAGNOSIS — J90 Pleural effusion, not elsewhere classified: Secondary | ICD-10-CM | POA: Diagnosis not present

## 2018-11-13 DIAGNOSIS — Z79899 Other long term (current) drug therapy: Secondary | ICD-10-CM | POA: Insufficient documentation

## 2018-11-13 LAB — CBC WITH DIFFERENTIAL/PLATELET
Abs Immature Granulocytes: 0.11 10*3/uL — ABNORMAL HIGH (ref 0.00–0.07)
Basophils Absolute: 0 10*3/uL (ref 0.0–0.1)
Basophils Relative: 1 %
Eosinophils Absolute: 0.2 10*3/uL (ref 0.0–0.5)
Eosinophils Relative: 4 %
HCT: 29.7 % — ABNORMAL LOW (ref 36.0–46.0)
Hemoglobin: 9.2 g/dL — ABNORMAL LOW (ref 12.0–15.0)
Immature Granulocytes: 2 %
LYMPHS PCT: 18 %
Lymphs Abs: 1.1 10*3/uL (ref 0.7–4.0)
MCH: 26.6 pg (ref 26.0–34.0)
MCHC: 31 g/dL (ref 30.0–36.0)
MCV: 85.8 fL (ref 80.0–100.0)
Monocytes Absolute: 1 10*3/uL (ref 0.1–1.0)
Monocytes Relative: 17 %
Neutro Abs: 3.4 10*3/uL (ref 1.7–7.7)
Neutrophils Relative %: 58 %
Platelets: 165 10*3/uL (ref 150–400)
RBC: 3.46 MIL/uL — ABNORMAL LOW (ref 3.87–5.11)
RDW: 17.2 % — ABNORMAL HIGH (ref 11.5–15.5)
WBC: 5.8 10*3/uL (ref 4.0–10.5)
nRBC: 0 % (ref 0.0–0.2)

## 2018-11-13 LAB — COMPREHENSIVE METABOLIC PANEL
ALT: 16 U/L (ref 0–44)
AST: 18 U/L (ref 15–41)
Albumin: 3.1 g/dL — ABNORMAL LOW (ref 3.5–5.0)
Alkaline Phosphatase: 65 U/L (ref 38–126)
Anion gap: 8 (ref 5–15)
BUN: 14 mg/dL (ref 8–23)
CHLORIDE: 99 mmol/L (ref 98–111)
CO2: 30 mmol/L (ref 22–32)
Calcium: 9.2 mg/dL (ref 8.9–10.3)
Creatinine, Ser: 1.01 mg/dL — ABNORMAL HIGH (ref 0.44–1.00)
GFR calc Af Amer: 60 mL/min — ABNORMAL LOW (ref >60)
GFR calc non Af Amer: 51 mL/min — ABNORMAL LOW (ref >60)
Glucose, Bld: 145 mg/dL — ABNORMAL HIGH (ref 70–99)
Potassium: 3.8 mmol/L (ref 3.5–5.1)
SODIUM: 137 mmol/L (ref 135–145)
Total Bilirubin: 0.4 mg/dL (ref 0.3–1.2)
Total Protein: 6.8 g/dL (ref 6.5–8.1)

## 2018-11-13 LAB — SAMPLE TO BLOOD BANK

## 2018-11-13 LAB — MAGNESIUM: Magnesium: 1.9 mg/dL (ref 1.7–2.4)

## 2018-11-13 MED ORDER — HEPARIN SOD (PORK) LOCK FLUSH 100 UNIT/ML IV SOLN
500.0000 [IU] | Freq: Once | INTRAVENOUS | Status: AC
  Administered 2018-11-13: 500 [IU]
  Filled 2018-11-13: qty 5

## 2018-11-13 MED ORDER — SODIUM CHLORIDE 0.9% FLUSH
10.0000 mL | Freq: Once | INTRAVENOUS | Status: AC
  Administered 2018-11-13: 10 mL
  Filled 2018-11-13: qty 10

## 2018-11-13 MED ORDER — HEPARIN SOD (PORK) LOCK FLUSH 100 UNIT/ML IV SOLN
500.0000 [IU] | Freq: Once | INTRAVENOUS | Status: DC
  Filled 2018-11-13: qty 5

## 2018-11-14 LAB — CA 125: Cancer Antigen (CA) 125: 437 U/mL — ABNORMAL HIGH (ref 0.0–38.1)

## 2018-11-17 ENCOUNTER — Inpatient Hospital Stay: Payer: Medicare Other

## 2018-11-17 ENCOUNTER — Encounter: Payer: Self-pay | Admitting: Hematology and Oncology

## 2018-11-17 ENCOUNTER — Other Ambulatory Visit: Payer: Self-pay | Admitting: Hematology and Oncology

## 2018-11-17 ENCOUNTER — Inpatient Hospital Stay (HOSPITAL_BASED_OUTPATIENT_CLINIC_OR_DEPARTMENT_OTHER): Payer: Medicare Other | Admitting: Hematology and Oncology

## 2018-11-17 ENCOUNTER — Telehealth: Payer: Self-pay | Admitting: Hematology and Oncology

## 2018-11-17 VITALS — BP 123/47 | HR 66 | Temp 98.5°F | Resp 17

## 2018-11-17 DIAGNOSIS — Z90722 Acquired absence of ovaries, bilateral: Secondary | ICD-10-CM

## 2018-11-17 DIAGNOSIS — C562 Malignant neoplasm of left ovary: Secondary | ICD-10-CM

## 2018-11-17 DIAGNOSIS — R188 Other ascites: Secondary | ICD-10-CM

## 2018-11-17 DIAGNOSIS — Z5111 Encounter for antineoplastic chemotherapy: Secondary | ICD-10-CM

## 2018-11-17 DIAGNOSIS — D638 Anemia in other chronic diseases classified elsewhere: Secondary | ICD-10-CM

## 2018-11-17 DIAGNOSIS — N183 Chronic kidney disease, stage 3 unspecified: Secondary | ICD-10-CM

## 2018-11-17 DIAGNOSIS — D631 Anemia in chronic kidney disease: Secondary | ICD-10-CM

## 2018-11-17 DIAGNOSIS — I129 Hypertensive chronic kidney disease with stage 1 through stage 4 chronic kidney disease, or unspecified chronic kidney disease: Secondary | ICD-10-CM | POA: Diagnosis not present

## 2018-11-17 DIAGNOSIS — I482 Chronic atrial fibrillation, unspecified: Secondary | ICD-10-CM

## 2018-11-17 DIAGNOSIS — Z90710 Acquired absence of both cervix and uterus: Secondary | ICD-10-CM

## 2018-11-17 DIAGNOSIS — G893 Neoplasm related pain (acute) (chronic): Secondary | ICD-10-CM

## 2018-11-17 DIAGNOSIS — Z79899 Other long term (current) drug therapy: Secondary | ICD-10-CM

## 2018-11-17 DIAGNOSIS — J90 Pleural effusion, not elsewhere classified: Secondary | ICD-10-CM

## 2018-11-17 LAB — CBC WITH DIFFERENTIAL/PLATELET
Abs Immature Granulocytes: 0.09 10*3/uL — ABNORMAL HIGH (ref 0.00–0.07)
BASOS ABS: 0 10*3/uL (ref 0.0–0.1)
Basophils Relative: 0 %
Eosinophils Absolute: 0.3 10*3/uL (ref 0.0–0.5)
Eosinophils Relative: 4 %
HCT: 27.3 % — ABNORMAL LOW (ref 36.0–46.0)
Hemoglobin: 8.2 g/dL — ABNORMAL LOW (ref 12.0–15.0)
Immature Granulocytes: 1 %
Lymphocytes Relative: 12 %
Lymphs Abs: 0.9 10*3/uL (ref 0.7–4.0)
MCH: 26.1 pg (ref 26.0–34.0)
MCHC: 30 g/dL (ref 30.0–36.0)
MCV: 86.9 fL (ref 80.0–100.0)
Monocytes Absolute: 0.8 10*3/uL (ref 0.1–1.0)
Monocytes Relative: 11 %
NEUTROS ABS: 5.4 10*3/uL (ref 1.7–7.7)
NEUTROS PCT: 72 %
Platelets: 214 10*3/uL (ref 150–400)
RBC: 3.14 MIL/uL — ABNORMAL LOW (ref 3.87–5.11)
RDW: 17.5 % — ABNORMAL HIGH (ref 11.5–15.5)
WBC: 7.5 10*3/uL (ref 4.0–10.5)
nRBC: 0 % (ref 0.0–0.2)

## 2018-11-17 LAB — COMPREHENSIVE METABOLIC PANEL
ALBUMIN: 3 g/dL — AB (ref 3.5–5.0)
ALT: 11 U/L (ref 0–44)
AST: 15 U/L (ref 15–41)
Alkaline Phosphatase: 65 U/L (ref 38–126)
Anion gap: 9 (ref 5–15)
BUN: 17 mg/dL (ref 8–23)
CHLORIDE: 102 mmol/L (ref 98–111)
CO2: 28 mmol/L (ref 22–32)
Calcium: 9.2 mg/dL (ref 8.9–10.3)
Creatinine, Ser: 1.06 mg/dL — ABNORMAL HIGH (ref 0.44–1.00)
GFR calc Af Amer: 56 mL/min — ABNORMAL LOW (ref >60)
GFR calc non Af Amer: 49 mL/min — ABNORMAL LOW (ref >60)
Glucose, Bld: 139 mg/dL — ABNORMAL HIGH (ref 70–99)
Potassium: 3.5 mmol/L (ref 3.5–5.1)
Sodium: 139 mmol/L (ref 135–145)
Total Bilirubin: 0.4 mg/dL (ref 0.3–1.2)
Total Protein: 6.5 g/dL (ref 6.5–8.1)

## 2018-11-17 LAB — MAGNESIUM: Magnesium: 1.5 mg/dL — ABNORMAL LOW (ref 1.7–2.4)

## 2018-11-17 LAB — SAMPLE TO BLOOD BANK

## 2018-11-17 LAB — PREPARE RBC (CROSSMATCH)

## 2018-11-17 MED ORDER — SODIUM CHLORIDE 0.9 % IV SOLN
149.4000 mg | Freq: Once | INTRAVENOUS | Status: AC
  Administered 2018-11-17: 150 mg via INTRAVENOUS
  Filled 2018-11-17: qty 15

## 2018-11-17 MED ORDER — SODIUM CHLORIDE 0.9% FLUSH
10.0000 mL | INTRAVENOUS | Status: DC | PRN
  Administered 2018-11-17: 10 mL
  Filled 2018-11-17: qty 10

## 2018-11-17 MED ORDER — PALONOSETRON HCL INJECTION 0.25 MG/5ML
INTRAVENOUS | Status: AC
  Filled 2018-11-17: qty 5

## 2018-11-17 MED ORDER — SODIUM CHLORIDE 0.9 % IV SOLN
Freq: Once | INTRAVENOUS | Status: DC
  Filled 2018-11-17: qty 250

## 2018-11-17 MED ORDER — DIPHENHYDRAMINE HCL 25 MG PO TABS
25.0000 mg | ORAL_TABLET | Freq: Once | ORAL | Status: AC
  Administered 2018-11-17: 25 mg via ORAL
  Filled 2018-11-17: qty 1

## 2018-11-17 MED ORDER — ACETAMINOPHEN 325 MG PO TABS
650.0000 mg | ORAL_TABLET | Freq: Once | ORAL | Status: AC
  Administered 2018-11-17: 650 mg via ORAL

## 2018-11-17 MED ORDER — FAMOTIDINE IN NACL 20-0.9 MG/50ML-% IV SOLN
INTRAVENOUS | Status: AC
  Filled 2018-11-17: qty 50

## 2018-11-17 MED ORDER — SODIUM CHLORIDE 0.9 % IV SOLN
400.0000 mg/m2 | Freq: Once | INTRAVENOUS | Status: AC
  Administered 2018-11-17: 760 mg via INTRAVENOUS
  Filled 2018-11-17: qty 19.99

## 2018-11-17 MED ORDER — SODIUM CHLORIDE 0.9 % IV SOLN
Freq: Once | INTRAVENOUS | Status: AC
  Administered 2018-11-17: 11:00:00 via INTRAVENOUS
  Filled 2018-11-17: qty 5

## 2018-11-17 MED ORDER — SODIUM CHLORIDE 0.9 % IV SOLN
Freq: Once | INTRAVENOUS | Status: AC
  Administered 2018-11-17: 10:00:00 via INTRAVENOUS
  Filled 2018-11-17: qty 250

## 2018-11-17 MED ORDER — ACETAMINOPHEN 325 MG PO TABS
ORAL_TABLET | ORAL | Status: AC
  Filled 2018-11-17: qty 2

## 2018-11-17 MED ORDER — SODIUM CHLORIDE 0.9% FLUSH
10.0000 mL | Freq: Once | INTRAVENOUS | Status: AC
  Administered 2018-11-17: 10 mL
  Filled 2018-11-17: qty 10

## 2018-11-17 MED ORDER — SODIUM CHLORIDE 0.9% IV SOLUTION
250.0000 mL | Freq: Once | INTRAVENOUS | Status: AC
  Administered 2018-11-17: 250 mL via INTRAVENOUS
  Filled 2018-11-17: qty 250

## 2018-11-17 MED ORDER — FAMOTIDINE IN NACL 20-0.9 MG/50ML-% IV SOLN
20.0000 mg | Freq: Once | INTRAVENOUS | Status: AC
  Administered 2018-11-17: 20 mg via INTRAVENOUS

## 2018-11-17 MED ORDER — PALONOSETRON HCL INJECTION 0.25 MG/5ML
0.2500 mg | Freq: Once | INTRAVENOUS | Status: AC
  Administered 2018-11-17: 0.25 mg via INTRAVENOUS

## 2018-11-17 MED ORDER — DIPHENHYDRAMINE HCL 25 MG PO CAPS
ORAL_CAPSULE | ORAL | Status: AC
  Filled 2018-11-17: qty 1

## 2018-11-17 MED ORDER — HEPARIN SOD (PORK) LOCK FLUSH 100 UNIT/ML IV SOLN
500.0000 [IU] | Freq: Once | INTRAVENOUS | Status: AC | PRN
  Administered 2018-11-17: 500 [IU]
  Filled 2018-11-17: qty 5

## 2018-11-17 NOTE — Assessment & Plan Note (Signed)
She has chronic atrial fibrillation and on chronic anticoagulation therapy For now, she will continue on chronic anticoagulation therapy and we will continue aggressive transfusion support as needed

## 2018-11-17 NOTE — Patient Instructions (Addendum)
[facility] Discharge Instructions for Patients Receiving Chemotherapy  Today you received the following chemotherapy agents :  Gemcitabine, Carboplatin.  To help prevent nausea and vomiting after your treatment, we encourage you to take your nausea medication as prescribed.   If you develop nausea and vomiting that is not controlled by your nausea medication, call the clinic.   BELOW ARE SYMPTOMS THAT SHOULD BE REPORTED IMMEDIATELY:  *FEVER GREATER THAN 100.5 F  *CHILLS WITH OR WITHOUT FEVER  NAUSEA AND VOMITING THAT IS NOT CONTROLLED WITH YOUR NAUSEA MEDICATION  *UNUSUAL SHORTNESS OF BREATH  *UNUSUAL BRUISING OR BLEEDING  TENDERNESS IN MOUTH AND THROAT WITH OR WITHOUT PRESENCE OF ULCERS  *URINARY PROBLEMS  *BOWEL PROBLEMS  UNUSUAL RASH Items with * indicate a potential emergency and should be followed up as soon as possible.  Feel free to call the clinic should you have any questions or concerns. The clinic phone number is [phone].  Please show the [facility] at check-in to the Emergency Department and triage nurse.   Blood Transfusion, Adult, Care After This sheet gives you information about how to care for yourself after your procedure. Your doctor may also give you more specific instructions. If you have problems or questions, contact your doctor. Follow these instructions at home:   Take over-the-counter and prescription medicines only as told by your doctor.  Go back to your normal activities as told by your doctor.  Follow instructions from your doctor about how to take care of the area where an IV tube was put into your vein (insertion site). Make sure you: ? Wash your hands with soap and water before you change your bandage (dressing). If there is no soap and water, use hand sanitizer. ? Change your bandage as told by your doctor.  Check your IV insertion site every day for signs of infection. Check for: ? More redness, swelling,  or pain. ? More fluid or blood. ? Warmth. ? Pus or a bad smell. Contact a doctor if:  You have more redness, swelling, or pain around the IV insertion site.  You have more fluid or blood coming from the IV insertion site.  Your IV insertion site feels warm to the touch.  You have pus or a bad smell coming from the IV insertion site.  Your pee (urine) turns pink, red, or brown.  You feel weak after doing your normal activities. Get help right away if:  You have signs of a serious allergic or body defense (immune) system reaction, including: ? Itchiness. ? Hives. ? Trouble breathing. ? Anxiety. ? Pain in your chest or lower back. ? Fever, flushing, and chills. ? Fast pulse. ? Rash. ? Watery poop (diarrhea). ? Throwing up (vomiting). ? Dark pee. ? Serious headache. ? Dizziness. ? Stiff neck. ? Yellow color in your face or the white parts of your eyes (jaundice). Summary  After a blood transfusion, return to your normal activities as told by your doctor.  Every day, check for signs of infection where the IV tube was put into your vein.  Some signs of infection are warm skin, more redness and pain, more fluid or blood, and pus or a bad smell where the needle went in.  Contact your doctor if you feel weak or have any unusual symptoms. This information is not intended to replace advice given to you by your health care provider. Make sure you discuss any questions you have with your health care provider. Document Released: 10/15/2014  Document Revised: 05/18/2016 Document Reviewed: 05/18/2016 Elsevier Interactive Patient Education  [employer].

## 2018-11-17 NOTE — Assessment & Plan Note (Signed)
She has stable chronic kidney disease We will monitor carefully while she is on treatment

## 2018-11-17 NOTE — Progress Notes (Signed)
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  She agree with the plan of care She will get repeat CT imaging at the end of the month before cycle 4 of chemotherapy  Anemia, chronic disease She has received blood transfusion recently She denies signs of bleeding She will continue her treatment as schedule and return next week for blood count monitoring and transfusion as needed, to keep hemoglobin greater than 8 We discussed some of the risks, benefits, and alternatives of blood transfusions. The patient is symptomatic from anemia and the hemoglobin level is critically low.  Some of the side-effects to be expected including risks of transfusion reactions, chills, infection, syndrome of volume overload and risk of hospitalization from various reasons and the patient is willing to proceed and went ahead to sign consent today. She will receive 1 unit of blood today  Cancer associated pain She will continue taking her pain medicine as prescribed I recommend her to continue on pain medication along with Tylenol as needed  CKD (chronic kidney disease) stage 3, GFR 30-59 ml/min (HCC) She has stable chronic kidney disease We will monitor carefully while she is on  treatment  Chronic atrial fibrillation She has chronic atrial fibrillation and on chronic anticoagulation therapy For now, she will continue on chronic anticoagulation therapy and we will continue aggressive transfusion support as needed   Orders Placed This Encounter  Procedures  . CT ABDOMEN PELVIS W CONTRAST    Standing Status:   Future    Standing Expiration Date:   11/18/2019    Order Specific Question:   If indicated for the ordered procedure, I authorize the administration of contrast media per Radiology protocol    Answer:   Yes    Order Specific Question:   Preferred imaging location?    Answer:   [facility]    Order Specific Question:   Radiology Contrast Protocol - do NOT remove file path    Answer:   [email or web address]  . Care order/instruction    Transfuse Parameters    Standing Status:   Future    Number of Occurrences:   1    Standing Expiration Date:   11/17/2019  . Complete patient signature process for consent form    Standing Status:   Future    Number of Occurrences:   1    Standing Expiration Date:   11/17/2019  . Practitioner attestation of consent    I, the ordering practitioner, attest that I have discussed with the patient the benefits, risks, side effects, alternatives, likelihood of achieving goals and potential problems during recovery for the procedure listed.    Standing Status:   Future    Number of Occurrences:   1    Standing Expiration Date:   11/17/2019    Order Specific Question:   Procedure    Answer:  Blood Product(s)  . Type and screen    Standing Status:   Future    Number of Occurrences:   1    Standing Expiration Date:   11/18/2019  . Prepare RBC    Standing Status:   Standing    Number of Occurrences:   1    Order Specific Question:   # of Units    Answer:   1 unit    Order Specific Question:   Transfusion Indications    Answer:   Symptomatic Anemia    Order Specific Question:   Special Requirements     Answer:   Irradiated    Order Specific Question:   If emergent release call blood bank    Answer:   [facility] [phone]    INTERVAL HISTORY: Please see below for problem oriented charting. She returns with her daughter for further follow-up She feels well Appetite is stable Her chronic pain is stable She denies recent changes in bowel habits No worsening peripheral neuropathy She has intermittent cough but no fever or chills She complains of fatigue The patient denies any recent signs or symptoms of bleeding such as spontaneous epistaxis, hematuria or hematochezia.   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 Interim finding of moderate ascites within the abdomen and pelvis with additional finding of diffuse nodular infiltration of the omentum and anterior mesenteric fat, the appearance would be consistent with peritoneal carcinomatosis/metastatic disease. Increasing retroperitoneal and upper abdominal adenopathy. 2. Re- demonstrated 3.8 cm cyst in the right adnexa. Enlarging soft tissue density in the left adnexa now with possible cystic component posteriorly. In light of the above findings, concern is for ovarian neoplasm. Correlation with pelvic ultrasound recommended. 3. Small right-sided pleural effusion, new since prior study 4. Stable hypodense splenic lesions since 2017.     06/25/2017 Imaging    US pelvis: 2.9 cm simple appearing cyst in the right  ovary. Left ovary grossly unremarkable. Large volume ascites in the pelvis    06/30/2017 - 07/01/2017 Hospital Admission    She was admitted for evaluation of abdominal pain and ascites    07/01/2017 Pathology Results    PERITONEAL/ASCITIC FLUID(SPECIMEN 1 OF 1 COLLECTED 07/01/17): - POORLY DIFFERENTIATED CARCINOMA; SEE COMMENT Source Peritoneal/Ascitic Fluid, (specimen 1 of 1 collected 07/01/17) Gross Specimen: Received is/are 1000 cc's of brownish fluid. (BS:bs) Prepared: # Smears: 0 # Concentration Technique Slides (i.e. ThinPrep): 1 # Cell Block: 1 Additional Studies: Also received Hematology slide - [id number]. Comment The tumor cells are positive for cytokeratin 7 and Pax-8 but negative for cytokeratin 20, CDX-2, GATA-3, Napsin-A and TTF-1. Based on the immunoprofile a gynecology primary is favored    07/01/2017 Procedure    Successful ultrasound-guided diagnostic and therapeutic paracentesis yielding 2.5 liters of peritoneal fluid    07/07/2017 - 07/09/2017 Hospital Admission    She was admitted for management of malignant ascites  07/08/2017 Procedure    Successful ultrasound-guided therapeutic paracentesis yielding 2.7 liters liters of peritoneal fluid    07/12/2017 Procedure    Successful ultrasound-guided paracentesis yielding 1450 mL of peritoneal fluid    07/18/2017 - 07/24/2017 Hospital Admission    She was admitted for expedited treatment    07/18/2017 Tumor Marker    Patient's tumor was tested for the following markers: CA125 Results of the tumor marker test revealed 1941    07/19/2017 - 02/04/2018 Chemotherapy    The patient had 6 cycles of carboplatin & Taxol for chemotherapy treatment, followed by 3 more cycles of carboplatin only     07/19/2017 - 02/04/2018 Chemotherapy    The patient had carboplatin and taxol    08/06/2017 Procedure    Successful ultrasound-guided therapeutic paracentesis yielding 2.6 liters of peritoneal fluid.    08/09/2017 Tumor Marker     Patient's tumor was tested for the following markers: CA125 Results of the tumor marker test revealed 1665    08/15/2017 Tumor Marker    Patient's tumor was tested for the following markers: CA125 Results of the tumor marker test revealed 937.9    08/20/2017 Imaging    ECHO: Normal LV size with EF 60-65%. Normal RV size and systolic function. No significant valvular abnormalities.    09/18/2017 Imaging    Chest Impression:  1. No evidence thoracic metastasis. 2. Interval increase and RIGHT pleural effusion.  Abdomen / Pelvis Impression:  1. Interval decrease in intraperitoneal free fluid. 2. Interval decrease in omental nodularity in the LEFT ventral peritoneal space. 3. Interval decrease in nodularity associated with the LEFT ovary. 4. Cystic portion of the RIGHT ovary is increased mildly in size.    09/20/2017 Tumor Marker    Patient's tumor was tested for the following markers: CA125 Results of the tumor marker test revealed 347    10/14/2017 Tumor Marker    Patient's tumor was tested for the following markers: CA125 Results of the tumor marker test revealed 307.4    11/04/2017 Tumor Marker    Patient's tumor was tested for the following markers: CA125 Results of the tumor marker test revealed 262.5    11/28/2017 Imaging    1. Interval decrease in right pleural effusion with resolution of right atelectasis seen previously. 2. New small left pleural effusion, symmetric to the right. 3. No intraperitoneal free fluid on the current study. 4. Continued further decrease in left omental disease, appearing less confluent today than on the prior study. 5. Left ovary remains normal in appearance today and the right adnexal cystic lesion is decreased in size compared to prior study. 6. 14 mm pancreatic cyst is unchanged. Continued attention on follow-up imaging recommended. 7. Aortic Atherosclerois (ICD10-170.0)    12/13/2017 Tumor Marker    Patient's tumor was tested for the  following markers: CA125 Results of the tumor marker test revealed 197.7    01/03/2018 Tumor Marker    Patient's tumor was tested for the following markers: CA125 Results of the tumor marker test revealed 183.1    01/14/2018 Tumor Marker    Patient's tumor was tested for the following markers: CA125 Results of the tumor marker test revealed 177.4    02/04/2018 Tumor Marker    Patient's tumor was tested for the following markers: CA125 Results of the tumor marker test revealed 168.5    02/25/2018 Imaging    1. Omental carcinomatosis appears qualitatively stable to slightly decreased. Stable mild peritoneal thickening in the paracolic gutters. Stable right adnexal cyst. No ascites.  No new or progressive metastatic disease in the abdomen or pelvis. 2. Small dependent right pleural effusion is increased. 3. Cystic pancreatic body lesion is decreased and now subcentimeter in size, suggesting a benign lesion. 4. Aortic Atherosclerosis (ICD10-I70.0).    03/03/2018 - 03/07/2018 Hospital Admission    She was hospitalized for GI bleed requiring blood transfusions. Xarelto was placed on hold    03/07/2018 PET scan    1. Persistent hazy omental interstitial nodularity but no hypermetabolism or discrete measurable nodules. No abdominal ascites. 2. No findings for metastatic disease involving the chest. 3. Moderate-sized right pleural effusion and small left pleural effusion.     03/20/2018 Pathology Results    1. Ovary and fallopian tube, right - OVARY AND FALLOPIAN TUBE INVOLVED BY SEROUS CARCINOMA. - PARATUBAL CYST. 2. Uterus +/- tubes/ovaries, neoplastic, cervix, left ovary and fallopian tube - LEFT OVARY: HIGH GRADE SEROUS CARCINOMA WITH TREATMENT EFFECT, SPANNING 2.5 CM. CARCINOMA INVOLVES OVARIAN SURFACE. SEE ONCOLOGY TABLE. - LEFT FALLOPIAN TUBE: INVOLVED BY SEROUS CARCINOMA. - UTERUS: -ENDOMETRIUM: INACTIVE ENDOMETRIUM. NO HYPERPLASIA OR MALIGNANCY. -MYOMETRIUM: UNREMARKABLE. NO  MALIGNANCY. -SEROSA: INVOLVED BY SEROUS CARCINOMA. - CERVIX: ENDOCERVICAL POLYP. NO MALIGNANCY. 3. Omentum, resection for tumor - INVOLVED BY SEROUS CARCINOMA. 4. Soft tissue, biopsy, mesenteric nodule - INVOLVED BY SEROUS CARCINOMA. Microscopic Comment 2. OVARY or FALLOPIAN TUBE or PRIMARY PERITONEUM: Procedure: Total hysterectomy and bilateral salpingo-oophorectomy. Omentectomy. Mesenteric lymph node biopsy. Specimen Integrity: Intact. Tumor Site: Left ovary. Ovarian Surface Involvement (required only if applicable): Present. Fallopian Tube Surface Involvement (required only if applicable): Present, bilateral. Tumor Size: 2.5 cm. Histologic Type: High grade serous carcinoma. Histologic Grade: High grade. Implants (required for advanced stage serous/seromucinous borderline tumors only): N/A. Other Tissue/ Organ Involvement: Bilateral fallopian tubes, right ovary, uterine serosa, omentum. Largest Extrapelvic Peritoneal Focus (required only if applicable): Microscopic, estimated 0.5 cm (omentum). Peritoneal/Ascitic Fluid: Prior Positive ([id number]). Treatment Effect (required only for high-grade serous carcinomas): Present in left ovary. CRS2. Regional Lymph Nodes: No lymph nodes submitted/identified. Pathologic Stage Classification (pTNM, AJCC 8th Edition): ypT3b, ypNX Representative Tumor Block: 1A, 1B, 81F, 62F. Comment(s): The right ovary has only surface deposits with a large paratubal cyst. The left ovary has intraparenchymal tumor with associated treatment effect. Thus the tumor location is classified as a left ovarian primary.    03/20/2018 Surgery    Procedure(s) Performed:  1. Exploratory laparotomy with total hysterectomy and bilateral salpingo-oophorectomy 2. Infragastic Omentectomy  3. Debulking to <1cm gross residual disease   Surgeon: Mart Piggs, MD  Specimens: Uterus Cervix, Bilateral tubes / ovaries and omentum. Mesenteric nodule.  Operative  Findings: Debulked to gross residual disease <1cm; however there is miliary disease in multiple locations including the majority of the abdominal peritoneum (anterior abdominal wall, bilateral gutters), diaphragm (Right>left), majority of small bowel mesentary. Normal appendix. Normal small uterus. Right ovary with a cystic lesion ~3cm, some adhesive disease of right adnexa to rectum/sigmoid. Gross omental disease, which was resected with the omentectomy. Smooth liver surface, but again, diaphragmatic disease noted.       03/20/2018 Genetic Testing    Patient has genetic testing done for ER/PR. Results revealed patient has ER: 90%, PR 0%.     03/31/2018 Tumor Marker    Patient's tumor was tested for the following markers: CA125 Results of the tumor marker test revealed 215.8     Genetic Testing    Patient has genetic testing done for BRCA 1. Results revealed patient has the following: BRCA 1: no loss of expression.  Genetic Testing    Patient has genetic testing done for MMR . Results revealed patient has the following:  MMR: normal    03/31/2018 Genetic Testing    Patient has genetic testing done for BRCA1/2. Results revealed patient has no actionable mutations. She is found to have [place] genetic change of unknown significance    04/21/2018 Imaging    1. No definite findings of residual or recurrent metastatic disease in the abdomen or pelvis status post interval TAHBSO and omentectomy. Stable minimal thickening in the paracolic gutters without discrete peritoneal nodularity. 2. Trace free fluid in the pelvic cul-de-sac. 3. Stable small dependent right pleural effusion. 4. Subcentimeter pancreatic body cystic lesion is stable to slightly decreased. 5. Aortic Atherosclerosis (ICD10-I70.0).    04/21/2018 Tumor Marker    Patient's tumor was tested for the following markers: CA125 Results of the tumor marker test revealed 187.3    04/23/2018 - 09/12/2018 Anti-estrogen oral therapy     She is placed on Femara    05/08/2018 Procedure    Successful ultrasound guided right thoracentesis yielding 800 mL of pleural fluid. Fluid cytology is negative for malignancy     05/28/2018 Tumor Marker    Patient's tumor was tested for the following markers: CA125 Results of the tumor marker test revealed 190    06/30/2018 Tumor Marker    Patient's tumor was tested for the following markers: CA125 Results of the tumor marker test revealed 148    07/23/2018 Imaging    Status post hysterectomy and bilateral salpingo-oophorectomy.  Very mild peritoneal thickening/nodularity, equivocal but worrisome for very mild peritoneal disease. Attention on follow-up is suggested.  Small right pleural effusion with indwelling pleural drain.    07/23/2018 Tumor Marker    Patient's tumor was tested for the following markers: CA125 Results of the tumor marker test revealed 215    09/11/2018 Tumor Marker    Patient's tumor was tested for the following markers: CA-125. Results of the tumor marker test revealed 1323    09/12/2018 Imaging    1. Mildly progressive ascites with some mild degree of nodularity most appreciable along the left adnexa, right paracolic gutter, right upper quadrant, compatible with peritoneal spread of tumor. 2. Small right pleural effusion with indwelling right pleural catheter again noted. Mild increase in atelectasis anteriorly at the right lung base. 3. Aortic Atherosclerosis (ICD10-I70.0). Coronary atherosclerosis. 4. Several tiny hypodense lesions in the spleen are technically nonspecific, but stable. 5. Air fluid level in the rectum compatible with diarrheal process. 6. Prominent bilateral hip arthropathy. Left hip screw noted.    09/22/2018 -  Chemotherapy    The patient had carboplatin and gemzar    09/29/2018 Tumor Marker    Patient's tumor was tested for the following markers: CA125 Results of the tumor marker test revealed 1219    09/29/2018 Adverse  Reaction    Cycle 1 day 8 of Gemzar was omitted due to severe anemia    10/20/2018 Tumor Marker    Patient's tumor was tested for the following markers: CA125 Results of the tumor marker test revealed 402    11/13/2018 Tumor Marker    Patient's tumor was tested for the following markers: CA125 Results of the tumor marker test revealed 437     REVIEW OF SYSTEMS:   Constitutional: Denies fevers, chills or abnormal weight loss Eyes: Denies blurriness of vision Ears, nose, mouth, throat, and face: Denies mucositis or sore throat Respiratory: Denies cough, dyspnea or wheezes Cardiovascular: Denies palpitation, chest discomfort or lower  extremity swelling Gastrointestinal:  Denies nausea, heartburn or change in bowel habits Skin: Denies abnormal skin rashes Lymphatics: Denies new lymphadenopathy or easy bruising Neurological:Denies numbness, tingling or new weaknesses Behavioral/Psych: Mood is stable, no new changes  All other systems were reviewed with the patient and are negative.  I have reviewed the past medical history, past surgical history, social history and family history with the patient and they are unchanged from previous note.  ALLERGIES:  is allergic to codeine.  MEDICATIONS:  Current Outpatient Medications  Medication Sig Dispense Refill  . albuterol (PROAIR HFA) 108 (90 Base) MCG/ACT inhaler Inhale 2 puffs into the lungs every 6 (six) hours as needed for wheezing or shortness of breath. 1 Inhaler 3  . ALPRAZolam (XANAX) 0.25 MG tablet Take 1 tablet (0.25 mg total) by mouth at bedtime as needed for anxiety. 5 tablet 0  . amoxicillin (AMOXIL) 500 MG capsule TAKE 4 CAPSULES PO 1 HOUR PRIOR TO DENTAL APPOINTMENT  1  . azelastine (ASTELIN) 0.1 % nasal spray Place 2 sprays into both nostrils 2 (two) times daily as needed for allergies.     . Cholecalciferol (VITAMIN D) 2000 units CAPS Take 2,000 Units by mouth daily.     Marland Kitchen dicyclomine (BENTYL) 10 MG capsule Take 1 tab by mouth  every morning. May take twice daily as needed. (Patient taking differently: Take 10 mg by mouth 2 (two) times daily. ) 60 capsule 6  . diltiazem (DILACOR XR) 180 MG 24 hr capsule Take 180 mg by mouth every morning.    . diltiazem (TIAZAC) 120 MG 24 hr capsule Take 120 mg by mouth daily. At night time    . diphenhydrAMINE (BENADRYL) 25 mg capsule Take 25 mg by mouth daily as needed for itching.     . diphenoxylate-atropine (LOMOTIL) 2.5-0.025 MG tablet Take 1 tablet by mouth 4 (four) times daily. (Patient taking differently: Take 1 tablet by mouth 4 (four) times daily as needed for diarrhea or loose stools. ) 360 tablet 1  . DULoxetine (CYMBALTA) 60 MG capsule Take 1 capsule (60 mg total) by mouth daily. 30 capsule 3  . fluticasone (FLONASE) 50 MCG/ACT nasal spray Place 2 sprays into both nostrils 2 (two) times daily as needed (FOR NASAL CONGESTION.).     Marland Kitchen furosemide (LASIX) 40 MG tablet Take 1 tablet (40 mg total) by mouth daily. 90 tablet 3  . halobetasol (ULTRAVATE) 0.05 % cream Apply 1 application topically 2 (two) times daily as needed (psoriasis).     Marland Kitchen levothyroxine (SYNTHROID, LEVOTHROID) 137 MCG tablet Take 137 mcg by mouth daily before breakfast. For thyroid therapy    . Magnesium 400 MG CAPS Take 400 mg by mouth daily.    . mirtazapine (REMERON) 15 MG tablet TAKE 1 TABLET(15 MG) BY MOUTH AT BEDTIME (Patient taking differently: Take 15 mg by mouth at bedtime. ) 30 tablet 11  . Oxycodone HCl 10 MG TABS Take 1 tablet (10 mg total) by mouth every 6 (six) hours as needed. 60 tablet 0  . polyvinyl alcohol (LIQUIFILM TEARS) 1.4 % ophthalmic solution Place 1 drop into both eyes 2 (two) times daily as needed for dry eyes.     . potassium chloride SA (K-DUR,KLOR-CON) 20 MEQ tablet TAKE 1 TABLET BY MOUTH DAILY AND TAKE 1 ADDITIONAL TABLET WHEN TAKING EXTRA LASIX DOSE 60 tablet 6  . STIOLTO RESPIMAT 2.5-2.5 MCG/ACT AERS INHALE 2 PUFFS INTO THE LUNGS DAILY 4 g 5  . Ustekinumab (STELARA Platea) Inject 1  Dose into  the skin every 3 (three) months.    Alveda Reasons 20 MG TABS tablet TAKE 1 TABLET BY MOUTH EVERY DAY AFTER SUPPER 90 tablet 3   No current facility-administered medications for this visit.    Facility-Administered Medications Ordered in Other Visits  Medication Dose Route Frequency Provider Last Rate Last Dose  . 0.9 %  sodium chloride infusion (Manually program via Guardrails IV Fluids)  250 mL Intravenous Once Hanad Leino, MD      . 0.9 %  sodium chloride infusion   Intravenous Once Alvy Bimler, Wadie Liew, MD      . CARBOplatin (PARAPLATIN) 150 mg in sodium chloride 0.9 % 250 mL chemo infusion  150 mg Intravenous Once Heath Lark, MD 530 mL/hr at 11/17/18 1235 150 mg at 11/17/18 1235  . heparin lock flush 100 unit/mL  500 Units Intracatheter Once PRN Alvy Bimler, Rhylan Gross, MD      . sodium chloride flush (NS) 0.9 % injection 10 mL  10 mL Intracatheter PRN Alvy Bimler, Demaris Bousquet, MD        PHYSICAL EXAMINATION: ECOG PERFORMANCE STATUS: 2 - Symptomatic, <50% confined to bed  Vitals:   11/17/18 0944  BP: (!) 134/44  Pulse: 96  Resp: 18  Temp: 98.1 F (36.7 C)  SpO2: 97%   Filed Weights   11/17/18 0944  Weight: 172 lb 6.4 oz (78.2 kg)    GENERAL:alert, no distress and comfortable. SKIN: skin color, texture, turgor are normal, no rashes or significant lesions EYES: normal, Conjunctiva are pale and non-injected, sclera clear OROPHARYNX:no exudate, no erythema and lips, buccal mucosa, and tongue normal  NECK: supple, thyroid normal size, non-tender, without nodularity LYMPH:  no palpable lymphadenopathy in the cervical, axillary or inguinal LUNGS: clear to auscultation and percussion with normal breathing effort HEART: regular rate & rhythm and no murmurs and no lower extremity edema ABDOMEN:abdomen soft, non-tender and normal bowel sounds Musculoskeletal:no cyanosis of digits and no clubbing  NEURO: alert & oriented x 3 with fluent speech, no focal motor/sensory deficits  LABORATORY DATA:  I have  reviewed the data as listed    Component Value Date/Time   NA 139 11/17/2018 0844   NA 135 (L) 09/24/2017 0945   K 3.5 11/17/2018 0844   K 3.7 09/24/2017 0945   CL 102 11/17/2018 0844   CO2 28 11/17/2018 0844   CO2 30 (H) 09/24/2017 0945   GLUCOSE 139 (H) 11/17/2018 0844   GLUCOSE 101 09/24/2017 0945   BUN 17 11/17/2018 0844   BUN 15.3 09/24/2017 0945   CREATININE 1.06 (H) 11/17/2018 0844   CREATININE 1.00 03/13/2018 0940   CREATININE 0.8 09/24/2017 0945   CALCIUM 9.2 11/17/2018 0844   CALCIUM 9.1 09/24/2017 0945   PROT 6.5 11/17/2018 0844   PROT 6.6 09/24/2017 0945   ALBUMIN 3.0 (L) 11/17/2018 0844   ALBUMIN 3.3 (L) 09/24/2017 0945   AST 15 11/17/2018 0844   AST 14 03/13/2018 0940   AST 16 09/24/2017 0945   ALT 11 11/17/2018 0844   ALT 7 03/13/2018 0940   ALT 12 09/24/2017 0945   ALKPHOS 65 11/17/2018 0844   ALKPHOS 52 09/24/2017 0945   BILITOT 0.4 11/17/2018 0844   BILITOT 0.3 03/13/2018 0940   BILITOT 1.13 09/24/2017 0945   GFRNONAA 49 (L) 11/17/2018 0844   GFRNONAA 51 (L) 03/13/2018 0940   GFRAA 56 (L) 11/17/2018 0844   GFRAA 59 (L) 03/13/2018 0940    No results found for: SPEP, UPEP  Lab Results  Component Value Date   WBC  7.5 11/17/2018   NEUTROABS 5.4 11/17/2018   HGB 8.2 (L) 11/17/2018   HCT 27.3 (L) 11/17/2018   MCV 86.9 11/17/2018   PLT 214 11/17/2018      Chemistry      Component Value Date/Time   NA 139 11/17/2018 0844   NA 135 (L) 09/24/2017 0945   K 3.5 11/17/2018 0844   K 3.7 09/24/2017 0945   CL 102 11/17/2018 0844   CO2 28 11/17/2018 0844   CO2 30 (H) 09/24/2017 0945   BUN 17 11/17/2018 0844   BUN 15.3 09/24/2017 0945   CREATININE 1.06 (H) 11/17/2018 0844   CREATININE 1.00 03/13/2018 0940   CREATININE 0.8 09/24/2017 0945      Component Value Date/Time   CALCIUM 9.2 11/17/2018 0844   CALCIUM 9.1 09/24/2017 0945   ALKPHOS 65 11/17/2018 0844   ALKPHOS 52 09/24/2017 0945   AST 15 11/17/2018 0844   AST 14 03/13/2018 0940   AST 16  09/24/2017 0945   ALT 11 11/17/2018 0844   ALT 7 03/13/2018 0940   ALT 12 09/24/2017 0945   BILITOT 0.4 11/17/2018 0844   BILITOT 0.3 03/13/2018 0940   BILITOT 1.13 09/24/2017 0945       RADIOGRAPHIC STUDIES: I have personally reviewed the radiological images as listed and agreed with the findings in the report. Dg Chest 2 View  Result Date: 10/23/2018 CLINICAL DATA:  Right pleural drain. History of ovarian cancer. Shortness of breath. EXAM: CHEST - 2 VIEW COMPARISON:  09/11/2018. FINDINGS: PowerPort catheter and right pleural drainage catheter in stable position. Tiny right apical pneumothorax again noted. Mild bibasilar atelectasis and/or scarring again noted. No acute infiltrate. Heart size stable. No acute bony abnormality. IMPRESSION: PowerPort catheter right pleural drainage catheter stable position. Tiny right apical pneumothorax again noted without interim change. Mild bibasilar atelectasis and or scarring. No acute abnormality identified. Electronically Signed   By: Marcello Moores  Register   On: 10/23/2018 12:04    All questions were answered. The patient knows to call the clinic with any problems, questions or concerns. No barriers to learning was detected.  I spent 25 minutes counseling the patient face to face. The total time spent in the appointment was 40 minutes and more than 50% was on counseling and review of test results  Heath Lark, MD 11/17/2018 12:52 PM

## 2018-11-17 NOTE — Telephone Encounter (Signed)
Gave avs and calendar ° °

## 2018-11-17 NOTE — Assessment & Plan Note (Signed)
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  She agree with the plan of care She will get repeat CT imaging at the end of the month before cycle 4 of chemotherapy

## 2018-11-17 NOTE — Assessment & Plan Note (Signed)
She has received blood transfusion recently She denies signs of bleeding She will continue her treatment as schedule and return next week for blood count monitoring and transfusion as needed, to keep hemoglobin greater than 8 We discussed some of the risks, benefits, and alternatives of blood transfusions. The patient is symptomatic from anemia and the hemoglobin level is critically low.  Some of the side-effects to be expected including risks of transfusion reactions, chills, infection, syndrome of volume overload and risk of hospitalization from various reasons and the patient is willing to proceed and went ahead to sign consent today. She will receive 1 unit of blood today

## 2018-11-17 NOTE — Assessment & Plan Note (Signed)
She will continue taking her pain medicine as prescribed I recommend her to continue on pain medication along with Tylenol as needed

## 2018-11-18 ENCOUNTER — Encounter (HOSPITAL_COMMUNITY): Payer: Medicare Other

## 2018-11-18 LAB — BPAM RBC
Blood Product Expiration Date: 202003062359
ISSUE DATE / TIME: 202002101324
Unit Type and Rh: 5100

## 2018-11-18 LAB — TYPE AND SCREEN
ABO/RH(D): O POS
Antibody Screen: NEGATIVE
Unit division: 0

## 2018-11-19 ENCOUNTER — Telehealth: Payer: Self-pay

## 2018-11-19 NOTE — Telephone Encounter (Signed)
Daughter called requesting CA 125 results.   Per Dr. Alvy Bimler called back and given results to [facility]. She verbalized understanding.

## 2018-11-20 ENCOUNTER — Encounter (HOSPITAL_COMMUNITY): Payer: Medicare Other

## 2018-11-21 ENCOUNTER — Other Ambulatory Visit: Payer: Self-pay | Admitting: *Deleted

## 2018-11-24 ENCOUNTER — Inpatient Hospital Stay (HOSPITAL_COMMUNITY): Payer: Medicare Other | Attending: Hematology

## 2018-11-24 ENCOUNTER — Other Ambulatory Visit: Payer: Self-pay | Admitting: Hematology and Oncology

## 2018-11-24 ENCOUNTER — Inpatient Hospital Stay: Payer: Medicare Other

## 2018-11-24 DIAGNOSIS — Z5111 Encounter for antineoplastic chemotherapy: Secondary | ICD-10-CM | POA: Diagnosis not present

## 2018-11-24 DIAGNOSIS — C562 Malignant neoplasm of left ovary: Secondary | ICD-10-CM | POA: Diagnosis present

## 2018-11-24 DIAGNOSIS — Z452 Encounter for adjustment and management of vascular access device: Secondary | ICD-10-CM | POA: Insufficient documentation

## 2018-11-24 DIAGNOSIS — D638 Anemia in other chronic diseases classified elsewhere: Secondary | ICD-10-CM

## 2018-11-24 LAB — CBC WITH DIFFERENTIAL/PLATELET
Abs Immature Granulocytes: 0.4 10*3/uL — ABNORMAL HIGH (ref 0.00–0.07)
Band Neutrophils: 1 %
Basophils Absolute: 0 10*3/uL (ref 0.0–0.1)
Basophils Relative: 0 %
Eosinophils Absolute: 0.1 10*3/uL (ref 0.0–0.5)
Eosinophils Relative: 1 %
HEMATOCRIT: 31.5 % — AB (ref 36.0–46.0)
Hemoglobin: 10 g/dL — ABNORMAL LOW (ref 12.0–15.0)
Lymphocytes Relative: 37 %
Lymphs Abs: 2.2 10*3/uL (ref 0.7–4.0)
MCH: 27.6 pg (ref 26.0–34.0)
MCHC: 31.7 g/dL (ref 30.0–36.0)
MCV: 87 fL (ref 80.0–100.0)
Metamyelocytes Relative: 2 %
Monocytes Absolute: 0.2 10*3/uL (ref 0.1–1.0)
Monocytes Relative: 4 %
Myelocytes: 4 %
Neutro Abs: 3.1 10*3/uL (ref 1.7–17.7)
Neutrophils Relative %: 51 %
Platelets: 235 10*3/uL (ref 150–400)
RBC: 3.62 MIL/uL — AB (ref 3.87–5.11)
RDW: 18.1 % — ABNORMAL HIGH (ref 11.5–15.5)
WBC: 5.9 10*3/uL (ref 4.0–10.5)
nRBC: 0 % (ref 0.0–0.2)

## 2018-11-24 LAB — COMPREHENSIVE METABOLIC PANEL
ALT: 19 U/L (ref 0–44)
ANION GAP: 10 (ref 5–15)
AST: 20 U/L (ref 15–41)
Albumin: 3.1 g/dL — ABNORMAL LOW (ref 3.5–5.0)
Alkaline Phosphatase: 68 U/L (ref 38–126)
BUN: 17 mg/dL (ref 8–23)
CO2: 28 mmol/L (ref 22–32)
Calcium: 9 mg/dL (ref 8.9–10.3)
Chloride: 97 mmol/L — ABNORMAL LOW (ref 98–111)
Creatinine, Ser: 1.04 mg/dL — ABNORMAL HIGH (ref 0.44–1.00)
GFR calc Af Amer: 58 mL/min — ABNORMAL LOW (ref >60)
GFR calc non Af Amer: 50 mL/min — ABNORMAL LOW (ref >60)
Glucose, Bld: 151 mg/dL — ABNORMAL HIGH (ref 70–99)
Potassium: 3.7 mmol/L (ref 3.5–5.1)
Sodium: 135 mmol/L (ref 135–145)
Total Bilirubin: 0.2 mg/dL — ABNORMAL LOW (ref 0.3–1.2)
Total Protein: 6.8 g/dL (ref 6.5–8.1)

## 2018-11-24 LAB — MAGNESIUM: Magnesium: 1.7 mg/dL (ref 1.7–2.4)

## 2018-11-24 LAB — SAMPLE TO BLOOD BANK

## 2018-11-24 MED ORDER — HEPARIN SOD (PORK) LOCK FLUSH 100 UNIT/ML IV SOLN
500.0000 [IU] | Freq: Once | INTRAVENOUS | Status: AC
  Administered 2018-11-24: 500 [IU] via INTRAVENOUS

## 2018-11-24 MED ORDER — SODIUM CHLORIDE 0.9% FLUSH
10.0000 mL | Freq: Once | INTRAVENOUS | Status: AC
  Administered 2018-11-24: 10 mL
  Filled 2018-11-24: qty 10

## 2018-11-24 NOTE — Progress Notes (Signed)
Hgb 10 today.  Clarise Cruz, RN for Dr. Alvy Bimler notified.  No blood transfusion needed today.  Explanations given to pt and daughter.

## 2018-11-25 ENCOUNTER — Encounter (HOSPITAL_COMMUNITY): Payer: Medicare Other

## 2018-11-26 ENCOUNTER — Encounter (HOSPITAL_COMMUNITY): Payer: Self-pay | Admitting: Radiology

## 2018-11-26 ENCOUNTER — Ambulatory Visit (HOSPITAL_COMMUNITY)
Admission: RE | Admit: 2018-11-26 | Discharge: 2018-11-26 | Disposition: A | Discharge status: Home / Self Care | Payer: Medicare Other | Source: Ambulatory Visit | Attending: Hematology and Oncology | Admitting: Hematology and Oncology

## 2018-11-26 DIAGNOSIS — C562 Malignant neoplasm of left ovary: Secondary | ICD-10-CM | POA: Diagnosis not present

## 2018-11-26 MED ORDER — HEPARIN SOD (PORK) LOCK FLUSH 100 UNIT/ML IV SOLN
500.0000 [IU] | Freq: Once | INTRAVENOUS | Status: AC
  Administered 2018-11-26: 500 [IU] via INTRAVENOUS

## 2018-11-26 MED ORDER — HEPARIN SOD (PORK) LOCK FLUSH 100 UNIT/ML IV SOLN
INTRAVENOUS | Status: AC
  Filled 2018-11-26: qty 5

## 2018-11-26 MED ORDER — SODIUM CHLORIDE (PF) 0.9 % IJ SOLN
INTRAMUSCULAR | Status: AC
  Filled 2018-11-26: qty 50

## 2018-11-26 MED ORDER — IOHEXOL 300 MG/ML  SOLN
100.0000 mL | Freq: Once | INTRAMUSCULAR | Status: AC | PRN
  Administered 2018-11-26: 100 mL via INTRAVENOUS

## 2018-11-27 ENCOUNTER — Encounter (HOSPITAL_COMMUNITY): Payer: Medicare Other

## 2018-12-01 ENCOUNTER — Encounter: Payer: Self-pay | Admitting: Hematology and Oncology

## 2018-12-01 ENCOUNTER — Inpatient Hospital Stay: Payer: Medicare Other

## 2018-12-01 ENCOUNTER — Other Ambulatory Visit (HOSPITAL_COMMUNITY): Payer: Self-pay

## 2018-12-01 ENCOUNTER — Inpatient Hospital Stay (HOSPITAL_BASED_OUTPATIENT_CLINIC_OR_DEPARTMENT_OTHER): Payer: Medicare Other | Admitting: Hematology and Oncology

## 2018-12-01 VITALS — BP 122/44 | HR 94 | Temp 98.3°F | Resp 18 | Ht 64.0 in | Wt 176.4 lb

## 2018-12-01 VITALS — BP 95/80 | HR 83 | Temp 98.1°F | Resp 17

## 2018-12-01 DIAGNOSIS — G893 Neoplasm related pain (acute) (chronic): Secondary | ICD-10-CM

## 2018-12-01 DIAGNOSIS — J90 Pleural effusion, not elsewhere classified: Secondary | ICD-10-CM

## 2018-12-01 DIAGNOSIS — C562 Malignant neoplasm of left ovary: Secondary | ICD-10-CM | POA: Diagnosis not present

## 2018-12-01 DIAGNOSIS — Z90710 Acquired absence of both cervix and uterus: Secondary | ICD-10-CM

## 2018-12-01 DIAGNOSIS — R188 Other ascites: Secondary | ICD-10-CM

## 2018-12-01 DIAGNOSIS — N183 Chronic kidney disease, stage 3 unspecified: Secondary | ICD-10-CM

## 2018-12-01 DIAGNOSIS — Z79899 Other long term (current) drug therapy: Secondary | ICD-10-CM

## 2018-12-01 DIAGNOSIS — Z5111 Encounter for antineoplastic chemotherapy: Secondary | ICD-10-CM | POA: Diagnosis not present

## 2018-12-01 DIAGNOSIS — D638 Anemia in other chronic diseases classified elsewhere: Secondary | ICD-10-CM

## 2018-12-01 DIAGNOSIS — I129 Hypertensive chronic kidney disease with stage 1 through stage 4 chronic kidney disease, or unspecified chronic kidney disease: Secondary | ICD-10-CM | POA: Diagnosis not present

## 2018-12-01 DIAGNOSIS — Z90722 Acquired absence of ovaries, bilateral: Secondary | ICD-10-CM

## 2018-12-01 DIAGNOSIS — D631 Anemia in chronic kidney disease: Secondary | ICD-10-CM

## 2018-12-01 DIAGNOSIS — I482 Chronic atrial fibrillation, unspecified: Secondary | ICD-10-CM

## 2018-12-01 LAB — CBC WITH DIFFERENTIAL/PLATELET
Abs Immature Granulocytes: 0.11 10*3/uL — ABNORMAL HIGH (ref 0.00–0.07)
BASOS PCT: 0 %
Basophils Absolute: 0 10*3/uL (ref 0.0–0.1)
EOS ABS: 0.1 10*3/uL (ref 0.0–0.5)
Eosinophils Relative: 2 %
HCT: 25.9 % — ABNORMAL LOW (ref 36.0–46.0)
Hemoglobin: 8 g/dL — ABNORMAL LOW (ref 12.0–15.0)
Immature Granulocytes: 1 %
Lymphocytes Relative: 10 %
Lymphs Abs: 0.8 10*3/uL (ref 0.7–4.0)
MCH: 26.7 pg (ref 26.0–34.0)
MCHC: 30.9 g/dL (ref 30.0–36.0)
MCV: 86.3 fL (ref 80.0–100.0)
Monocytes Absolute: 0.9 10*3/uL (ref 0.1–1.0)
Monocytes Relative: 11 %
Neutro Abs: 6.4 10*3/uL (ref 1.7–7.7)
Neutrophils Relative %: 76 %
PLATELETS: 231 10*3/uL (ref 150–400)
RBC: 3 MIL/uL — AB (ref 3.87–5.11)
RDW: 17.9 % — ABNORMAL HIGH (ref 11.5–15.5)
WBC: 8.4 10*3/uL (ref 4.0–10.5)
nRBC: 0 % (ref 0.0–0.2)

## 2018-12-01 LAB — COMPREHENSIVE METABOLIC PANEL
ALT: 11 U/L (ref 0–44)
AST: 16 U/L (ref 15–41)
Albumin: 2.9 g/dL — ABNORMAL LOW (ref 3.5–5.0)
Alkaline Phosphatase: 68 U/L (ref 38–126)
Anion gap: 8 (ref 5–15)
BUN: 20 mg/dL (ref 8–23)
CALCIUM: 8.4 mg/dL — AB (ref 8.9–10.3)
CO2: 28 mmol/L (ref 22–32)
Chloride: 96 mmol/L — ABNORMAL LOW (ref 98–111)
Creatinine, Ser: 1.1 mg/dL — ABNORMAL HIGH (ref 0.44–1.00)
GFR calc Af Amer: 54 mL/min — ABNORMAL LOW (ref >60)
GFR, EST NON AFRICAN AMERICAN: 46 mL/min — AB (ref >60)
Glucose, Bld: 129 mg/dL — ABNORMAL HIGH (ref 70–99)
Potassium: 3.8 mmol/L (ref 3.5–5.1)
Sodium: 132 mmol/L — ABNORMAL LOW (ref 135–145)
Total Bilirubin: 0.3 mg/dL (ref 0.3–1.2)
Total Protein: 6.6 g/dL (ref 6.5–8.1)

## 2018-12-01 LAB — SAMPLE TO BLOOD BANK

## 2018-12-01 LAB — PREPARE RBC (CROSSMATCH)

## 2018-12-01 MED ORDER — SODIUM CHLORIDE 0.9 % IV SOLN
Freq: Once | INTRAVENOUS | Status: AC
  Administered 2018-12-01: 11:00:00 via INTRAVENOUS
  Filled 2018-12-01: qty 250

## 2018-12-01 MED ORDER — SODIUM CHLORIDE 0.9 % IV SOLN
400.0000 mg/m2 | Freq: Once | INTRAVENOUS | Status: AC
  Administered 2018-12-01: 760 mg via INTRAVENOUS
  Filled 2018-12-01: qty 19.99

## 2018-12-01 MED ORDER — DIPHENHYDRAMINE HCL 25 MG PO CAPS
25.0000 mg | ORAL_CAPSULE | Freq: Once | ORAL | Status: AC
  Administered 2018-12-01: 25 mg via ORAL

## 2018-12-01 MED ORDER — ACETAMINOPHEN 325 MG PO TABS
ORAL_TABLET | ORAL | Status: AC
  Filled 2018-12-01: qty 2

## 2018-12-01 MED ORDER — OXYCODONE HCL 10 MG PO TABS: 10.0000 mg | ORAL_TABLET | Freq: Four times a day (QID) | ORAL | 0 refills | Status: DC | PRN

## 2018-12-01 MED ORDER — SODIUM CHLORIDE 0.9% FLUSH
10.0000 mL | INTRAVENOUS | Status: AC | PRN
  Administered 2018-12-01: 10 mL
  Filled 2018-12-01: qty 10

## 2018-12-01 MED ORDER — ACETAMINOPHEN 325 MG PO TABS
650.0000 mg | ORAL_TABLET | Freq: Once | ORAL | Status: AC
  Administered 2018-12-01: 650 mg via ORAL

## 2018-12-01 MED ORDER — PROCHLORPERAZINE MALEATE 10 MG PO TABS
10.0000 mg | ORAL_TABLET | Freq: Once | ORAL | Status: AC
  Administered 2018-12-01: 10 mg via ORAL

## 2018-12-01 MED ORDER — DIPHENHYDRAMINE HCL 25 MG PO CAPS
ORAL_CAPSULE | ORAL | Status: AC
  Filled 2018-12-01: qty 1

## 2018-12-01 MED ORDER — SODIUM CHLORIDE 0.9% FLUSH
10.0000 mL | INTRAVENOUS | Status: DC | PRN
  Filled 2018-12-01: qty 10

## 2018-12-01 MED ORDER — SODIUM CHLORIDE 0.9% IV SOLUTION
250.0000 mL | Freq: Once | INTRAVENOUS | Status: DC
  Filled 2018-12-01: qty 250

## 2018-12-01 MED ORDER — HEPARIN SOD (PORK) LOCK FLUSH 100 UNIT/ML IV SOLN
500.0000 [IU] | Freq: Every day | INTRAVENOUS | Status: AC | PRN
  Administered 2018-12-01: 500 [IU]
  Filled 2018-12-01: qty 5

## 2018-12-01 MED ORDER — HEPARIN SOD (PORK) LOCK FLUSH 100 UNIT/ML IV SOLN
500.0000 [IU] | Freq: Once | INTRAVENOUS | Status: DC | PRN
  Filled 2018-12-01: qty 5

## 2018-12-01 MED ORDER — SODIUM CHLORIDE 0.9% FLUSH
10.0000 mL | Freq: Once | INTRAVENOUS | Status: AC
  Administered 2018-12-01: 10 mL
  Filled 2018-12-01: qty 10

## 2018-12-01 MED ORDER — PROCHLORPERAZINE MALEATE 10 MG PO TABS
ORAL_TABLET | ORAL | Status: AC
  Filled 2018-12-01: qty 1

## 2018-12-01 NOTE — Assessment & Plan Note (Signed)
She has stable chronic kidney disease We will monitor carefully while she is on treatment

## 2018-12-01 NOTE — Assessment & Plan Note (Signed)
I have reviewed imaging studies with the patient and her daughter The patient had positive response to treatment She will continue a few more cycles of therapy She will continue aggressive transfusion support while on treatment We will continue dose adjustment for treatment

## 2018-12-01 NOTE — Progress Notes (Signed)
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[facility]) I have reviewed imaging studies with the patient and her daughter The patient had positive response to treatment She will continue a few more cycles of therapy She will continue aggressive transfusion support while on treatment We will continue dose adjustment for treatment   Anemia, chronic disease She has received blood transfusion recently She denies signs of bleeding She will continue her treatment as schedule and return next week for blood count monitoring and transfusion as needed, to keep hemoglobin greater than 8 We discussed some of the risks, benefits, and alternatives of blood transfusions. The patient is symptomatic from anemia and the hemoglobin level is critically low.  Some of the side-effects to be expected including risks of transfusion reactions, chills, infection, syndrome of volume overload and risk of hospitalization from various reasons and the patient is willing to proceed and went ahead to sign consent today. She will receive 1 unit of blood today  Cancer associated pain She will continue taking her pain medicine as prescribed I recommend her to continue on pain medication along with Tylenol as needed I refilled her prescription pain medicine today  CKD (chronic kidney disease) stage 3, GFR 30-59 ml/min (HCC) She has stable chronic kidney disease We will monitor carefully while she is on treatment   Orders Placed This Encounter  Procedures  . Type and screen    Standing Status:   Future    Number of Occurrences:   1     Standing Expiration Date:   12/02/2019  . Prepare RBC    Standing Status:   Standing    Number of Occurrences:   1    Order Specific Question:   # of Units    Answer:   1 unit    Order Specific Question:   Transfusion Indications    Answer:   Symptomatic Anemia    Order Specific Question:   Special Requirements    Answer:   Irradiated    Order Specific Question:   If emergent release call blood bank    Answer:   Not emergent release    INTERVAL HISTORY: Please see below for problem oriented charting. She returns with her daughter for further follow-up and chemotherapy She feels well Shortness of breath is stable The patient denies any recent signs or symptoms of bleeding such as spontaneous epistaxis, hematuria or hematochezia. Her chronic pain is stable No worsening peripheral neuropathy Denies recent infection, fever or chills No recent cough.  The pleural fluid amount from the Pleurx catheter is less  SUMMARY OF ONCOLOGIC HISTORY: Oncology History   High grade serous ER 90%, PR 0% BRCA 1: no loss of expression MMR normal      Left ovarian epithelial cancer ([employer])   02/18/2016 Tumor Marker    Patient's tumor was tested for the following markers: CA125 Results of the tumor marker test revealed 45    05/22/2016 Tumor Marker    Patient's tumor was tested for the following markers: CA125 Results of the tumor marker test revealed 53    05/22/2016 Imaging    Outside pelvic US showed 4.1 cm adnexa mass  06/24/2017 Imaging    Ct abdomen and pelvis:  1. Interim finding of moderate ascites within the abdomen and pelvis with additional finding of diffuse nodular infiltration of the omentum and anterior mesenteric fat, the appearance would be consistent with peritoneal carcinomatosis/metastatic disease. Increasing retroperitoneal and upper abdominal adenopathy. 2. Re- demonstrated 3.8 cm cyst in the right adnexa. Enlarging soft tissue density in the left adnexa now with possible  cystic component posteriorly. In light of the above findings, concern is for ovarian neoplasm. Correlation with pelvic ultrasound recommended. 3. Small right-sided pleural effusion, new since prior study 4. Stable hypodense splenic lesions since 2017.     06/25/2017 Imaging    US pelvis: 2.9 cm simple appearing cyst in the right ovary. Left ovary grossly unremarkable. Large volume ascites in the pelvis    06/30/2017 - 07/01/2017 Hospital Admission    She was admitted for evaluation of abdominal pain and ascites    07/01/2017 Pathology Results    PERITONEAL/ASCITIC FLUID(SPECIMEN 1 OF 1 COLLECTED 07/01/17): - POORLY DIFFERENTIATED CARCINOMA; SEE COMMENT Source Peritoneal/Ascitic Fluid, (specimen 1 of 1 collected 07/01/17) Gross Specimen: Received is/are 1000 cc's of brownish fluid. (BS:bs) Prepared: # Smears: 0 # Concentration Technique Slides (i.e. ThinPrep): 1 # Cell Block: 1 Additional Studies: Also received Hematology slide - [id number]. Comment The tumor cells are positive for cytokeratin 7 and Pax-8 but negative for cytokeratin 20, CDX-2, GATA-3, Napsin-A and TTF-1. Based on the immunoprofile a gynecology primary is favored    07/01/2017 Procedure    Successful ultrasound-guided diagnostic and therapeutic paracentesis yielding 2.5 liters of peritoneal fluid    07/07/2017 - 07/09/2017 Hospital Admission    She was admitted for management of malignant ascites    07/08/2017 Procedure    Successful ultrasound-guided therapeutic paracentesis yielding 2.7 liters liters of peritoneal fluid    07/12/2017 Procedure    Successful ultrasound-guided paracentesis yielding 1450 mL of peritoneal fluid    07/18/2017 - 07/24/2017 Hospital Admission    She was admitted for expedited treatment    07/18/2017 Tumor Marker    Patient's tumor was tested for the following markers: CA125 Results of the tumor marker test revealed 1941    07/19/2017 - 02/04/2018 Chemotherapy    The patient had 6 cycles of  carboplatin & Taxol for chemotherapy treatment, followed by 3 more cycles of carboplatin only     07/19/2017 - 02/04/2018 Chemotherapy    The patient had carboplatin and taxol    08/06/2017 Procedure    Successful ultrasound-guided therapeutic paracentesis yielding 2.6 liters of peritoneal fluid.    08/09/2017 Tumor Marker    Patient's tumor was tested for the following markers: CA125 Results of the tumor marker test revealed 1665    08/15/2017 Tumor Marker    Patient's tumor was tested for the following markers: CA125 Results of the tumor marker test revealed 937.9    08/20/2017 Imaging    ECHO: Normal LV size with EF 60-65%. Normal RV size and systolic function. No significant valvular abnormalities.    09/18/2017 Imaging    Chest Impression:  1. No evidence thoracic metastasis. 2. Interval increase and RIGHT pleural effusion.  Abdomen / Pelvis Impression:  1. Interval decrease in intraperitoneal free fluid. 2. Interval decrease in omental nodularity in the LEFT ventral peritoneal space. 3. Interval decrease in nodularity associated with the LEFT ovary. 4. Cystic portion of the RIGHT ovary is increased mildly in size.    09/20/2017 Tumor Marker    Patient's tumor was tested for the following markers:  CA125 Results of the tumor marker test revealed 347    10/14/2017 Tumor Marker    Patient's tumor was tested for the following markers: CA125 Results of the tumor marker test revealed 307.4    11/04/2017 Tumor Marker    Patient's tumor was tested for the following markers: CA125 Results of the tumor marker test revealed 262.5    11/28/2017 Imaging    1. Interval decrease in right pleural effusion with resolution of right atelectasis seen previously. 2. New small left pleural effusion, symmetric to the right. 3. No intraperitoneal free fluid on the current study. 4. Continued further decrease in left omental disease, appearing less confluent today than on the prior study. 5.  Left ovary remains normal in appearance today and the right adnexal cystic lesion is decreased in size compared to prior study. 6. 14 mm pancreatic cyst is unchanged. Continued attention on follow-up imaging recommended. 7. Aortic Atherosclerois (ICD10-170.0)    12/13/2017 Tumor Marker    Patient's tumor was tested for the following markers: CA125 Results of the tumor marker test revealed 197.7    01/03/2018 Tumor Marker    Patient's tumor was tested for the following markers: CA125 Results of the tumor marker test revealed 183.1    01/14/2018 Tumor Marker    Patient's tumor was tested for the following markers: CA125 Results of the tumor marker test revealed 177.4    02/04/2018 Tumor Marker    Patient's tumor was tested for the following markers: CA125 Results of the tumor marker test revealed 168.5    02/25/2018 Imaging    1. Omental carcinomatosis appears qualitatively stable to slightly decreased. Stable mild peritoneal thickening in the paracolic gutters. Stable right adnexal cyst. No ascites. No new or progressive metastatic disease in the abdomen or pelvis. 2. Small dependent right pleural effusion is increased. 3. Cystic pancreatic body lesion is decreased and now subcentimeter in size, suggesting a benign lesion. 4. Aortic Atherosclerosis (ICD10-I70.0).    03/03/2018 - 03/07/2018 Hospital Admission    She was hospitalized for GI bleed requiring blood transfusions. Xarelto was placed on hold    03/07/2018 PET scan    1. Persistent hazy omental interstitial nodularity but no hypermetabolism or discrete measurable nodules. No abdominal ascites. 2. No findings for metastatic disease involving the chest. 3. Moderate-sized right pleural effusion and small left pleural effusion.     03/20/2018 Pathology Results    1. Ovary and fallopian tube, right - OVARY AND FALLOPIAN TUBE INVOLVED BY SEROUS CARCINOMA. - PARATUBAL CYST. 2. Uterus +/- tubes/ovaries, neoplastic, cervix, left ovary  and fallopian tube - LEFT OVARY: HIGH GRADE SEROUS CARCINOMA WITH TREATMENT EFFECT, SPANNING 2.5 CM. CARCINOMA INVOLVES OVARIAN SURFACE. SEE ONCOLOGY TABLE. - LEFT FALLOPIAN TUBE: INVOLVED BY SEROUS CARCINOMA. - UTERUS: -ENDOMETRIUM: INACTIVE ENDOMETRIUM. NO HYPERPLASIA OR MALIGNANCY. -MYOMETRIUM: UNREMARKABLE. NO MALIGNANCY. -SEROSA: INVOLVED BY SEROUS CARCINOMA. - CERVIX: ENDOCERVICAL POLYP. NO MALIGNANCY. 3. Omentum, resection for tumor - INVOLVED BY SEROUS CARCINOMA. 4. Soft tissue, biopsy, mesenteric nodule - INVOLVED BY SEROUS CARCINOMA. Microscopic Comment 2. OVARY or FALLOPIAN TUBE or PRIMARY PERITONEUM: Procedure: Total hysterectomy and bilateral salpingo-oophorectomy. Omentectomy. Mesenteric lymph node biopsy. Specimen Integrity: Intact. Tumor Site: Left ovary. Ovarian Surface Involvement (required only if applicable): Present. Fallopian Tube Surface Involvement (required only if applicable): Present, bilateral. Tumor Size: 2.5 cm. Histologic Type: High grade serous carcinoma. Histologic Grade: High grade. Implants (required for advanced stage serous/seromucinous borderline tumors only): N/A. Other Tissue/ Organ Involvement: Bilateral fallopian tubes, right ovary, uterine serosa, omentum. Largest Extrapelvic Peritoneal  Focus (required only if applicable): Microscopic, estimated 0.5 cm (omentum). Peritoneal/Ascitic Fluid: Prior Positive ([id number]). Treatment Effect (required only for high-grade serous carcinomas): Present in left ovary. CRS2. Regional Lymph Nodes: No lymph nodes submitted/identified. Pathologic Stage Classification (pTNM, AJCC 8th Edition): ypT3b, ypNX Representative Tumor Block: 1A, 1B, 29F, 15F. Comment(s): The right ovary has only surface deposits with a large paratubal cyst. The left ovary has intraparenchymal tumor with associated treatment effect. Thus the tumor location is classified as a left ovarian primary.    03/20/2018 Surgery    Procedure(s)  Performed:  1. Exploratory laparotomy with total hysterectomy and bilateral salpingo-oophorectomy 2. Infragastic Omentectomy  3. Debulking to <1cm gross residual disease   Surgeon: Mart Piggs, MD  Specimens: Uterus Cervix, Bilateral tubes / ovaries and omentum. Mesenteric nodule.  Operative Findings: Debulked to gross residual disease <1cm; however there is miliary disease in multiple locations including the majority of the abdominal peritoneum (anterior abdominal wall, bilateral gutters), diaphragm (Right>left), majority of small bowel mesentary. Normal appendix. Normal small uterus. Right ovary with a cystic lesion ~3cm, some adhesive disease of right adnexa to rectum/sigmoid. Gross omental disease, which was resected with the omentectomy. Smooth liver surface, but again, diaphragmatic disease noted.       03/20/2018 Genetic Testing    Patient has genetic testing done for ER/PR. Results revealed patient has ER: 90%, PR 0%.     03/31/2018 Tumor Marker    Patient's tumor was tested for the following markers: CA125 Results of the tumor marker test revealed 215.8     Genetic Testing    Patient has genetic testing done for BRCA 1. Results revealed patient has the following: BRCA 1: no loss of expression.     Genetic Testing    Patient has genetic testing done for MMR . Results revealed patient has the following:  MMR: normal    03/31/2018 Genetic Testing    Patient has genetic testing done for BRCA1/2. Results revealed patient has no actionable mutations. 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 No definite findings of residual or recurrent metastatic disease in the abdomen or pelvis status post interval TAHBSO and omentectomy. Stable minimal thickening in the paracolic gutters without discrete peritoneal nodularity. 2. Trace free fluid in the pelvic cul-de-sac. 3. Stable small dependent right pleural  effusion. 4. Subcentimeter pancreatic body cystic lesion is stable to slightly decreased. 5. Aortic Atherosclerosis (ICD10-I70.0).    04/21/2018 Tumor Marker    Patient's tumor was tested for the following markers: CA125 Results of the tumor marker test revealed 187.3    04/23/2018 - 09/12/2018 Anti-estrogen oral therapy    She is placed on Femara    05/08/2018 Procedure    Successful ultrasound guided right thoracentesis yielding 800 mL of pleural fluid. Fluid cytology is negative for malignancy     05/28/2018 Tumor Marker    Patient's tumor was tested for the following markers: CA125 Results of the tumor marker test revealed 190    06/30/2018 Tumor Marker    Patient's tumor was tested for the following markers: CA125 Results of the tumor marker test revealed 148    07/23/2018 Imaging    Status post hysterectomy and bilateral salpingo-oophorectomy.  Very mild peritoneal thickening/nodularity, equivocal but worrisome for very mild peritoneal disease. Attention on follow-up is suggested.  Small right pleural effusion with indwelling pleural drain.    07/23/2018 Tumor Marker    Patient's tumor was tested for  the following markers: CA125 Results of the tumor marker test revealed 215    09/11/2018 Tumor Marker    Patient's tumor was tested for the following markers: CA-125. Results of the tumor marker test revealed 1323    09/12/2018 Imaging    1. Mildly progressive ascites with some mild degree of nodularity most appreciable along the left adnexa, right paracolic gutter, right upper quadrant, compatible with peritoneal spread of tumor. 2. Small right pleural effusion with indwelling right pleural catheter again noted. Mild increase in atelectasis anteriorly at the right lung base. 3. Aortic Atherosclerosis (ICD10-I70.0). Coronary atherosclerosis. 4. Several tiny hypodense lesions in the spleen are technically nonspecific, but stable. 5. Air fluid level in the rectum compatible with  diarrheal process. 6. Prominent bilateral hip arthropathy. Left hip screw noted.    09/22/2018 -  Chemotherapy    The patient had carboplatin and gemzar    09/29/2018 Tumor Marker    Patient's tumor was tested for the following markers: CA125 Results of the tumor marker test revealed 1219    09/29/2018 Adverse Reaction    Cycle 1 day 8 of Gemzar was omitted due to severe anemia    10/20/2018 Tumor Marker    Patient's tumor was tested for the following markers: CA125 Results of the tumor marker test revealed 402    11/13/2018 Tumor Marker    Patient's tumor was tested for the following markers: CA125 Results of the tumor marker test revealed 437    11/26/2018 Imaging    1. Mild residual peritoneal thickening, overall decreased from 09/11/2018, with resolution of previously seen ascites. 2. Tiny residual right pleural effusion with small bore chest tube in place. 3. Supraumbilical midline ventral hernia has a wide neck and contains unobstructed colon. 4.  Aortic atherosclerosis (ICD10-170.0).      REVIEW OF SYSTEMS:   Constitutional: Denies fevers, chills or abnormal weight loss Eyes: Denies blurriness of vision Ears, nose, mouth, throat, and face: Denies mucositis or sore throat Respiratory: Denies cough, dyspnea or wheezes Cardiovascular: Denies palpitation, chest discomfort or lower extremity swelling Gastrointestinal:  Denies nausea, heartburn or change in bowel habits Skin: Denies abnormal skin rashes Lymphatics: Denies new lymphadenopathy or easy bruising Neurological:Denies numbness, tingling or new weaknesses Behavioral/Psych: Mood is stable, no new changes  All other systems were reviewed with the patient and are negative.  I have reviewed the past medical history, past surgical history, social history and family history with the patient and they are unchanged from previous note.  ALLERGIES:  is allergic to codeine.  MEDICATIONS:  Current Outpatient Medications   Medication Sig Dispense Refill  . albuterol (PROAIR HFA) 108 (90 Base) MCG/ACT inhaler Inhale 2 puffs into the lungs every 6 (six) hours as needed for wheezing or shortness of breath. 1 Inhaler 3  . ALPRAZolam (XANAX) 0.25 MG tablet Take 1 tablet (0.25 mg total) by mouth at bedtime as needed for anxiety. 5 tablet 0  . amoxicillin (AMOXIL) 500 MG capsule TAKE 4 CAPSULES PO 1 HOUR PRIOR TO DENTAL APPOINTMENT  1  . azelastine (ASTELIN) 0.1 % nasal spray Place 2 sprays into both nostrils 2 (two) times daily as needed for allergies.     Marland Kitchen CARTIA XT 120 MG 24 hr capsule     . Cholecalciferol (VITAMIN D) 2000 units CAPS Take 2,000 Units by mouth daily.     Marland Kitchen dicyclomine (BENTYL) 10 MG capsule Take 1 tab by mouth every morning. May take twice daily as needed. (Patient taking differently: Take 10 mg  by mouth 2 (two) times daily. ) 60 capsule 6  . diltiazem (DILACOR XR) 180 MG 24 hr capsule Take 180 mg by mouth every morning.    . diltiazem (TIAZAC) 120 MG 24 hr capsule Take 120 mg by mouth daily. At night time    . diphenhydrAMINE (BENADRYL) 25 mg capsule Take 25 mg by mouth daily as needed for itching.     . diphenoxylate-atropine (LOMOTIL) 2.5-0.025 MG tablet Take 1 tablet by mouth 4 (four) times daily. (Patient taking differently: Take 1 tablet by mouth 4 (four) times daily as needed for diarrhea or loose stools. ) 360 tablet 1  . DULoxetine (CYMBALTA) 60 MG capsule Take 1 capsule (60 mg total) by mouth daily. 30 capsule 3  . fluticasone (FLONASE) 50 MCG/ACT nasal spray Place 2 sprays into both nostrils 2 (two) times daily as needed (FOR NASAL CONGESTION.).     Marland Kitchen furosemide (LASIX) 40 MG tablet Take 1 tablet (40 mg total) by mouth daily. 90 tablet 3  . halobetasol (ULTRAVATE) 0.05 % cream Apply 1 application topically 2 (two) times daily as needed (psoriasis).     Marland Kitchen levothyroxine (SYNTHROID, LEVOTHROID) 137 MCG tablet Take 137 mcg by mouth daily before breakfast. For thyroid therapy    . Magnesium 400  MG CAPS Take 400 mg by mouth daily.    . mirtazapine (REMERON) 15 MG tablet TAKE 1 TABLET(15 MG) BY MOUTH AT BEDTIME (Patient taking differently: Take 15 mg by mouth at bedtime. ) 30 tablet 11  . Oxycodone HCl 10 MG TABS Take 1 tablet (10 mg total) by mouth every 6 (six) hours as needed. 60 tablet 0  . polyvinyl alcohol (LIQUIFILM TEARS) 1.4 % ophthalmic solution Place 1 drop into both eyes 2 (two) times daily as needed for dry eyes.     . potassium chloride SA (K-DUR,KLOR-CON) 20 MEQ tablet TAKE 1 TABLET BY MOUTH DAILY AND TAKE 1 ADDITIONAL TABLET WHEN TAKING EXTRA LASIX DOSE 60 tablet 6  . STIOLTO RESPIMAT 2.5-2.5 MCG/ACT AERS INHALE 2 PUFFS INTO THE LUNGS DAILY 4 g 5  . XARELTO 20 MG TABS tablet TAKE 1 TABLET BY MOUTH EVERY DAY AFTER SUPPER 90 tablet 3   No current facility-administered medications for this visit.    Facility-Administered Medications Ordered in Other Visits  Medication Dose Route Frequency Provider Last Rate Last Dose  . 0.9 %  sodium chloride infusion (Manually program via Guardrails IV Fluids)  250 mL Intravenous Once Alvy Bimler, Tila Millirons, MD      . acetaminophen (TYLENOL) tablet 650 mg  650 mg Oral Once Alvy Bimler, Riad Wagley, MD      . diphenhydrAMINE (BENADRYL) capsule 25 mg  25 mg Oral Once Alvy Bimler, Rikki Smestad, MD      . gemcitabine (GEMZAR) 760 mg in sodium chloride 0.9 % 100 mL chemo infusion  400 mg/m2 (Treatment Plan Recorded) Intravenous Once Alvy Bimler, Terianna Peggs, MD      . heparin lock flush 100 unit/mL  500 Units Intracatheter Once PRN Alvy Bimler, Kacey Dysert, MD      . heparin lock flush 100 unit/mL  500 Units Intracatheter Daily PRN Alvy Bimler, Evon Lopezperez, MD      . prochlorperazine (COMPAZINE) tablet 10 mg  10 mg Oral Once Rosalin Buster, MD      . sodium chloride flush (NS) 0.9 % injection 10 mL  10 mL Intracatheter PRN Tavaras Goody, MD      . sodium chloride flush (NS) 0.9 % injection 10 mL  10 mL Intracatheter PRN Heath Lark, MD  PHYSICAL EXAMINATION: ECOG PERFORMANCE STATUS: 2 - Symptomatic, <50% confined to  bed  Vitals:   12/01/18 1004  BP: (!) 122/44  Pulse: 94  Resp: 18  Temp: 98.3 F (36.8 C)  SpO2: 97%   Filed Weights   12/01/18 1004  Weight: 176 lb 6.4 oz (80 kg)    GENERAL:alert, no distress and comfortable SKIN: skin color, texture, turgor are normal, no rashes or significant lesions EYES: normal, Conjunctiva are pink and non-injected, sclera clear OROPHARYNX:no exudate, no erythema and lips, buccal mucosa, and tongue normal  NECK: supple, thyroid normal size, non-tender, without nodularity LYMPH:  no palpable lymphadenopathy in the cervical, axillary or inguinal LUNGS: clear to auscultation and percussion with normal breathing effort HEART: regular rate & rhythm and no murmurs and no lower extremity edema ABDOMEN:abdomen soft, non-tender and normal bowel sounds Musculoskeletal:no cyanosis of digits and no clubbing  NEURO: alert & oriented x 3 with fluent speech, no focal motor/sensory deficits  LABORATORY DATA:  I have reviewed the data as listed    Component Value Date/Time   NA 132 (L) 12/01/2018 0911   NA 135 (L) 09/24/2017 0945   K 3.8 12/01/2018 0911   K 3.7 09/24/2017 0945   CL 96 (L) 12/01/2018 0911   CO2 28 12/01/2018 0911   CO2 30 (H) 09/24/2017 0945   GLUCOSE 129 (H) 12/01/2018 0911   GLUCOSE 101 09/24/2017 0945   BUN 20 12/01/2018 0911   BUN 15.3 09/24/2017 0945   CREATININE 1.10 (H) 12/01/2018 0911   CREATININE 1.00 03/13/2018 0940   CREATININE 0.8 09/24/2017 0945   CALCIUM 8.4 (L) 12/01/2018 0911   CALCIUM 9.1 09/24/2017 0945   PROT 6.6 12/01/2018 0911   PROT 6.6 09/24/2017 0945   ALBUMIN 2.9 (L) 12/01/2018 0911   ALBUMIN 3.3 (L) 09/24/2017 0945   AST 16 12/01/2018 0911   AST 14 03/13/2018 0940   AST 16 09/24/2017 0945   ALT 11 12/01/2018 0911   ALT 7 03/13/2018 0940   ALT 12 09/24/2017 0945   ALKPHOS 68 12/01/2018 0911   ALKPHOS 52 09/24/2017 0945   BILITOT 0.3 12/01/2018 0911   BILITOT 0.3 03/13/2018 0940   BILITOT 1.13 09/24/2017 0945    GFRNONAA 46 (L) 12/01/2018 0911   GFRNONAA 51 (L) 03/13/2018 0940   GFRAA 54 (L) 12/01/2018 0911   GFRAA 59 (L) 03/13/2018 0940    No results found for: SPEP, UPEP  Lab Results  Component Value Date   WBC 8.4 12/01/2018   NEUTROABS 6.4 12/01/2018   HGB 8.0 (L) 12/01/2018   HCT 25.9 (L) 12/01/2018   MCV 86.3 12/01/2018   PLT 231 12/01/2018      Chemistry      Component Value Date/Time   NA 132 (L) 12/01/2018 0911   NA 135 (L) 09/24/2017 0945   K 3.8 12/01/2018 0911   K 3.7 09/24/2017 0945   CL 96 (L) 12/01/2018 0911   CO2 28 12/01/2018 0911   CO2 30 (H) 09/24/2017 0945   BUN 20 12/01/2018 0911   BUN 15.3 09/24/2017 0945   CREATININE 1.10 (H) 12/01/2018 0911   CREATININE 1.00 03/13/2018 0940   CREATININE 0.8 09/24/2017 0945      Component Value Date/Time   CALCIUM 8.4 (L) 12/01/2018 0911   CALCIUM 9.1 09/24/2017 0945   ALKPHOS 68 12/01/2018 0911   ALKPHOS 52 09/24/2017 0945   AST 16 12/01/2018 0911   AST 14 03/13/2018 0940   AST 16 09/24/2017 0945   ALT 11  12/01/2018 0911   ALT 7 03/13/2018 0940   ALT 12 09/24/2017 0945   BILITOT 0.3 12/01/2018 0911   BILITOT 0.3 03/13/2018 0940   BILITOT 1.13 09/24/2017 0945       RADIOGRAPHIC STUDIES: I have reviewed multiple imaging studies with the patient and her daughter I have personally reviewed the radiological images as listed and agreed with the findings in the report. Ct Abdomen Pelvis W Contrast  Result Date: 11/26/2018 CLINICAL DATA:  Ovarian cancer chemotherapy in progress. EXAM: CT ABDOMEN AND PELVIS WITH CONTRAST TECHNIQUE: Multidetector CT imaging of the abdomen and pelvis was performed using the standard protocol following bolus administration of intravenous contrast. CONTRAST:  149m OMNIPAQUE IOHEXOL 300 MG/ML  SOLN COMPARISON:  09/11/2018 and 09/18/2017. FINDINGS: Lower chest: 3 mm peripheral right lower lobe nodule (series 7, image 10) unchanged from 09/18/2017. Small bore chest tube is seen within a  tiny right pleural effusion. Heart size normal. No pericardial effusion. Distal esophagus is grossly unremarkable. Hepatobiliary: Slight marginal irregularity of the liver may be due to known peritoneal disease. Liver is otherwise unremarkable. Common bile duct dilatation is unchanged and likely related to cholecystectomy. Pancreas: Negative. Spleen: Negative. Adrenals/Urinary Tract: Adrenal glands and right kidney are unremarkable. Subcentimeter low-attenuation lesion in the left kidney is too small to characterize. Ureters are decompressed. Bladder is low in volume. Stomach/Bowel: Stomach and small bowel are unremarkable. Appendix is not readily visualized. Unobstructed colon extends into a wide neck supraumbilical midline ventral hernia. Colon is otherwise unremarkable. Vascular/Lymphatic: Atherosclerotic calcification of the aorta without aneurysm. Retroaortic left renal vein. No pathologically enlarged lymph nodes. Reproductive: Hysterectomy.  No adnexal mass. Other: Slight residual peritoneal thickening along the right and left walls of the abdomen and pelvis, overall improved from 09/11/2018. Ascites has resolved. Musculoskeletal: Postoperative changes in the proximal femur. Degenerative changes spine and. No worrisome lytic or sclerotic lesions. L5 superior endplate compression is unchanged. IMPRESSION: 1. Mild residual peritoneal thickening, overall decreased from 09/11/2018, with resolution of previously seen ascites. 2. Tiny residual right pleural effusion with small bore chest tube in place. 3. Supraumbilical midline ventral hernia has a wide neck and contains unobstructed colon. 4.  Aortic atherosclerosis (ICD10-170.0). Electronically Signed   By: MLorin PicketM.D.   On: 11/26/2018 12:16    All questions were answered. The patient knows to call the clinic with any problems, questions or concerns. No barriers to learning was detected.  I spent 25 minutes counseling the patient face to face. The  total time spent in the appointment was 30 minutes and more than 50% was on counseling and review of test results  NHeath Lark MD 12/01/2018 10:48 AM

## 2018-12-01 NOTE — Assessment & Plan Note (Signed)
She will continue taking her pain medicine as prescribed I recommend her to continue on pain medication along with Tylenol as needed I refilled her prescription pain medicine today

## 2018-12-01 NOTE — Patient Instructions (Signed)
[facility] Discharge Instructions for Patients Receiving Chemotherapy  Today you received the following chemotherapy agents :  Gemcitabine  To help prevent nausea and vomiting after your treatment, we encourage you to take your nausea medication as prescribed.   If you develop nausea and vomiting that is not controlled by your nausea medication, call the clinic.   BELOW ARE SYMPTOMS THAT SHOULD BE REPORTED IMMEDIATELY:  *FEVER GREATER THAN 100.5 F  *CHILLS WITH OR WITHOUT FEVER  NAUSEA AND VOMITING THAT IS NOT CONTROLLED WITH YOUR NAUSEA MEDICATION  *UNUSUAL SHORTNESS OF BREATH  *UNUSUAL BRUISING OR BLEEDING  TENDERNESS IN MOUTH AND THROAT WITH OR WITHOUT PRESENCE OF ULCERS  *URINARY PROBLEMS  *BOWEL PROBLEMS  UNUSUAL RASH Items with * indicate a potential emergency and should be followed up as soon as possible.  Feel free to call the clinic should you have any questions or concerns. The clinic phone number is [phone].  Please show the [facility] at check-in to the Emergency Department and triage nurse.   Blood Transfusion, Adult, Care After This sheet gives you information about how to care for yourself after your procedure. Your doctor may also give you more specific instructions. If you have problems or questions, contact your doctor. Follow these instructions at home:   Take over-the-counter and prescription medicines only as told by your doctor.  Go back to your normal activities as told by your doctor.  Follow instructions from your doctor about how to take care of the area where an IV tube was put into your vein (insertion site). Make sure you: ? Wash your hands with soap and water before you change your bandage (dressing). If there is no soap and water, use hand sanitizer. ? Change your bandage as told by your doctor.  Check your IV insertion site every day for signs of infection. Check for: ? More redness, swelling, or  pain. ? More fluid or blood. ? Warmth. ? Pus or a bad smell. Contact a doctor if:  You have more redness, swelling, or pain around the IV insertion site.  You have more fluid or blood coming from the IV insertion site.  Your IV insertion site feels warm to the touch.  You have pus or a bad smell coming from the IV insertion site.  Your pee (urine) turns pink, red, or brown.  You feel weak after doing your normal activities. Get help right away if:  You have signs of a serious allergic or body defense (immune) system reaction, including: ? Itchiness. ? Hives. ? Trouble breathing. ? Anxiety. ? Pain in your chest or lower back. ? Fever, flushing, and chills. ? Fast pulse. ? Rash. ? Watery poop (diarrhea). ? Throwing up (vomiting). ? Dark pee. ? Serious headache. ? Dizziness. ? Stiff neck. ? Yellow color in your face or the white parts of your eyes (jaundice). Summary  After a blood transfusion, return to your normal activities as told by your doctor.  Every day, check for signs of infection where the IV tube was put into your vein.  Some signs of infection are warm skin, more redness and pain, more fluid or blood, and pus or a bad smell where the needle went in.  Contact your doctor if you feel weak or have any unusual symptoms. This information is not intended to replace advice given to you by your health care provider. Make sure you discuss any questions you have with your health care provider. Document Released: 10/15/2014 Document  Revised: 05/18/2016 Document Reviewed: 05/18/2016 Elsevier Interactive Patient Education  [employer].

## 2018-12-01 NOTE — Assessment & Plan Note (Signed)
She has received blood transfusion recently She denies signs of bleeding She will continue her treatment as schedule and return next week for blood count monitoring and transfusion as needed, to keep hemoglobin greater than 8 We discussed some of the risks, benefits, and alternatives of blood transfusions. The patient is symptomatic from anemia and the hemoglobin level is critically low.  Some of the side-effects to be expected including risks of transfusion reactions, chills, infection, syndrome of volume overload and risk of hospitalization from various reasons and the patient is willing to proceed and went ahead to sign consent today. She will receive 1 unit of blood today

## 2018-12-02 ENCOUNTER — Encounter (HOSPITAL_COMMUNITY): Payer: Medicare Other

## 2018-12-02 LAB — TYPE AND SCREEN
ABO/RH(D): O POS
Antibody Screen: NEGATIVE
Unit division: 0

## 2018-12-02 LAB — BPAM RBC
Blood Product Expiration Date: 202003212359
ISSUE DATE / TIME: 202002241132
Unit Type and Rh: 5100

## 2018-12-04 ENCOUNTER — Encounter (HOSPITAL_COMMUNITY): Payer: Medicare Other

## 2018-12-08 ENCOUNTER — Inpatient Hospital Stay: Payer: Medicare Other

## 2018-12-08 ENCOUNTER — Inpatient Hospital Stay (HOSPITAL_BASED_OUTPATIENT_CLINIC_OR_DEPARTMENT_OTHER): Payer: Medicare Other | Admitting: Hematology and Oncology

## 2018-12-08 ENCOUNTER — Inpatient Hospital Stay: Payer: Medicare Other | Attending: Hematology and Oncology

## 2018-12-08 ENCOUNTER — Encounter: Payer: Self-pay | Admitting: Hematology and Oncology

## 2018-12-08 VITALS — HR 81

## 2018-12-08 DIAGNOSIS — J90 Pleural effusion, not elsewhere classified: Secondary | ICD-10-CM

## 2018-12-08 DIAGNOSIS — C562 Malignant neoplasm of left ovary: Secondary | ICD-10-CM

## 2018-12-08 DIAGNOSIS — R188 Other ascites: Secondary | ICD-10-CM

## 2018-12-08 DIAGNOSIS — Z79899 Other long term (current) drug therapy: Secondary | ICD-10-CM | POA: Diagnosis not present

## 2018-12-08 DIAGNOSIS — Z7901 Long term (current) use of anticoagulants: Secondary | ICD-10-CM

## 2018-12-08 DIAGNOSIS — Z5111 Encounter for antineoplastic chemotherapy: Secondary | ICD-10-CM

## 2018-12-08 DIAGNOSIS — K1231 Oral mucositis (ulcerative) due to antineoplastic therapy: Secondary | ICD-10-CM | POA: Insufficient documentation

## 2018-12-08 DIAGNOSIS — R590 Localized enlarged lymph nodes: Secondary | ICD-10-CM | POA: Insufficient documentation

## 2018-12-08 DIAGNOSIS — D61818 Other pancytopenia: Secondary | ICD-10-CM | POA: Diagnosis not present

## 2018-12-08 DIAGNOSIS — N390 Urinary tract infection, site not specified: Secondary | ICD-10-CM | POA: Insufficient documentation

## 2018-12-08 DIAGNOSIS — I129 Hypertensive chronic kidney disease with stage 1 through stage 4 chronic kidney disease, or unspecified chronic kidney disease: Secondary | ICD-10-CM | POA: Insufficient documentation

## 2018-12-08 DIAGNOSIS — G893 Neoplasm related pain (acute) (chronic): Secondary | ICD-10-CM | POA: Diagnosis not present

## 2018-12-08 DIAGNOSIS — Z79811 Long term (current) use of aromatase inhibitors: Secondary | ICD-10-CM | POA: Diagnosis not present

## 2018-12-08 DIAGNOSIS — D638 Anemia in other chronic diseases classified elsewhere: Secondary | ICD-10-CM

## 2018-12-08 DIAGNOSIS — I700 Atherosclerosis of aorta: Secondary | ICD-10-CM | POA: Diagnosis not present

## 2018-12-08 DIAGNOSIS — Z90710 Acquired absence of both cervix and uterus: Secondary | ICD-10-CM | POA: Diagnosis not present

## 2018-12-08 DIAGNOSIS — J449 Chronic obstructive pulmonary disease, unspecified: Secondary | ICD-10-CM | POA: Insufficient documentation

## 2018-12-08 DIAGNOSIS — K439 Ventral hernia without obstruction or gangrene: Secondary | ICD-10-CM | POA: Insufficient documentation

## 2018-12-08 DIAGNOSIS — Z90722 Acquired absence of ovaries, bilateral: Secondary | ICD-10-CM | POA: Diagnosis not present

## 2018-12-08 DIAGNOSIS — I482 Chronic atrial fibrillation, unspecified: Secondary | ICD-10-CM | POA: Insufficient documentation

## 2018-12-08 DIAGNOSIS — N183 Chronic kidney disease, stage 3 (moderate): Secondary | ICD-10-CM | POA: Diagnosis not present

## 2018-12-08 LAB — CBC WITH DIFFERENTIAL/PLATELET
Abs Immature Granulocytes: 0.17 10*3/uL — ABNORMAL HIGH (ref 0.00–0.07)
Basophils Absolute: 0 10*3/uL (ref 0.0–0.1)
Basophils Relative: 1 %
Eosinophils Absolute: 0.1 10*3/uL (ref 0.0–0.5)
Eosinophils Relative: 2 %
HCT: 28.9 % — ABNORMAL LOW (ref 36.0–46.0)
Hemoglobin: 9.1 g/dL — ABNORMAL LOW (ref 12.0–15.0)
IMMATURE GRANULOCYTES: 4 %
Lymphocytes Relative: 19 %
Lymphs Abs: 0.8 10*3/uL (ref 0.7–4.0)
MCH: 27.7 pg (ref 26.0–34.0)
MCHC: 31.5 g/dL (ref 30.0–36.0)
MCV: 88.1 fL (ref 80.0–100.0)
Monocytes Absolute: 0.8 10*3/uL (ref 0.1–1.0)
Monocytes Relative: 19 %
NRBC: 0 % (ref 0.0–0.2)
Neutro Abs: 2.2 10*3/uL (ref 1.7–7.7)
Neutrophils Relative %: 55 %
Platelets: 210 10*3/uL (ref 150–400)
RBC: 3.28 MIL/uL — AB (ref 3.87–5.11)
RDW: 16.9 % — ABNORMAL HIGH (ref 11.5–15.5)
WBC: 4 10*3/uL (ref 4.0–10.5)

## 2018-12-08 LAB — COMPREHENSIVE METABOLIC PANEL
ALT: 21 U/L (ref 0–44)
AST: 27 U/L (ref 15–41)
Albumin: 2.8 g/dL — ABNORMAL LOW (ref 3.5–5.0)
Alkaline Phosphatase: 68 U/L (ref 38–126)
Anion gap: 9 (ref 5–15)
BUN: 16 mg/dL (ref 8–23)
CO2: 29 mmol/L (ref 22–32)
Calcium: 8.7 mg/dL — ABNORMAL LOW (ref 8.9–10.3)
Chloride: 98 mmol/L (ref 98–111)
Creatinine, Ser: 1.09 mg/dL — ABNORMAL HIGH (ref 0.44–1.00)
GFR calc Af Amer: 54 mL/min — ABNORMAL LOW (ref >60)
GFR calc non Af Amer: 47 mL/min — ABNORMAL LOW (ref >60)
Glucose, Bld: 135 mg/dL — ABNORMAL HIGH (ref 70–99)
Potassium: 4 mmol/L (ref 3.5–5.1)
Sodium: 136 mmol/L (ref 135–145)
TOTAL PROTEIN: 6.7 g/dL (ref 6.5–8.1)
Total Bilirubin: 0.2 mg/dL — ABNORMAL LOW (ref 0.3–1.2)

## 2018-12-08 LAB — SAMPLE TO BLOOD BANK

## 2018-12-08 MED ORDER — SODIUM CHLORIDE 0.9% FLUSH
10.0000 mL | INTRAVENOUS | Status: DC | PRN
  Administered 2018-12-08: 10 mL
  Filled 2018-12-08: qty 10

## 2018-12-08 MED ORDER — HEPARIN SOD (PORK) LOCK FLUSH 100 UNIT/ML IV SOLN
500.0000 [IU] | Freq: Once | INTRAVENOUS | Status: AC | PRN
  Administered 2018-12-08: 500 [IU]
  Filled 2018-12-08: qty 5

## 2018-12-08 MED ORDER — SODIUM CHLORIDE 0.9 % IV SOLN
146.6000 mg | Freq: Once | INTRAVENOUS | Status: AC
  Administered 2018-12-08: 150 mg via INTRAVENOUS
  Filled 2018-12-08: qty 15

## 2018-12-08 MED ORDER — SODIUM CHLORIDE 0.9 % IV SOLN
400.0000 mg/m2 | Freq: Once | INTRAVENOUS | Status: AC
  Administered 2018-12-08: 760 mg via INTRAVENOUS
  Filled 2018-12-08: qty 19.99

## 2018-12-08 MED ORDER — SODIUM CHLORIDE 0.9% FLUSH
10.0000 mL | Freq: Once | INTRAVENOUS | Status: AC
  Administered 2018-12-08: 10 mL
  Filled 2018-12-08: qty 10

## 2018-12-08 MED ORDER — SODIUM CHLORIDE 0.9 % IV SOLN
20.0000 mg | Freq: Once | INTRAVENOUS | Status: AC
  Administered 2018-12-08: 20 mg via INTRAVENOUS
  Filled 2018-12-08: qty 2

## 2018-12-08 MED ORDER — DIPHENHYDRAMINE HCL 25 MG PO TABS
25.0000 mg | ORAL_TABLET | Freq: Once | ORAL | Status: AC
  Administered 2018-12-08: 25 mg via ORAL
  Filled 2018-12-08: qty 1

## 2018-12-08 MED ORDER — PALONOSETRON HCL INJECTION 0.25 MG/5ML
0.2500 mg | Freq: Once | INTRAVENOUS | Status: AC
  Administered 2018-12-08: 0.25 mg via INTRAVENOUS

## 2018-12-08 MED ORDER — SODIUM CHLORIDE 0.9 % IV SOLN
Freq: Once | INTRAVENOUS | Status: AC
  Administered 2018-12-08: 10:00:00 via INTRAVENOUS
  Filled 2018-12-08: qty 250

## 2018-12-08 MED ORDER — SODIUM CHLORIDE 0.9 % IV SOLN
Freq: Once | INTRAVENOUS | Status: AC
  Administered 2018-12-08: 10:00:00 via INTRAVENOUS
  Filled 2018-12-08: qty 5

## 2018-12-08 MED ORDER — DIPHENHYDRAMINE HCL 25 MG PO CAPS
ORAL_CAPSULE | ORAL | Status: AC
  Filled 2018-12-08: qty 1

## 2018-12-08 MED ORDER — PALONOSETRON HCL INJECTION 0.25 MG/5ML
INTRAVENOUS | Status: AC
  Filled 2018-12-08: qty 5

## 2018-12-08 NOTE — Patient Instructions (Signed)
[facility] Discharge Instructions for Patients Receiving Chemotherapy  Today you received the following chemotherapy agents:  Gemzar, Carboplatin  To help prevent nausea and vomiting after your treatment, we encourage you to take your nausea medication as prescribed.   If you develop nausea and vomiting that is not controlled by your nausea medication, call the clinic.   BELOW ARE SYMPTOMS THAT SHOULD BE REPORTED IMMEDIATELY:  *FEVER GREATER THAN 100.5 F  *CHILLS WITH OR WITHOUT FEVER  NAUSEA AND VOMITING THAT IS NOT CONTROLLED WITH YOUR NAUSEA MEDICATION  *UNUSUAL SHORTNESS OF BREATH  *UNUSUAL BRUISING OR BLEEDING  TENDERNESS IN MOUTH AND THROAT WITH OR WITHOUT PRESENCE OF ULCERS  *URINARY PROBLEMS  *BOWEL PROBLEMS  UNUSUAL RASH Items with * indicate a potential emergency and should be followed up as soon as possible.  Feel free to call the clinic should you have any questions or concerns. The clinic phone number is [phone].  Please show the [facility] at check-in to the Emergency Department and triage nurse.

## 2018-12-09 ENCOUNTER — Encounter: Payer: Self-pay | Admitting: Hematology and Oncology

## 2018-12-09 ENCOUNTER — Telehealth: Payer: Self-pay | Admitting: Hematology and Oncology

## 2018-12-09 ENCOUNTER — Encounter (HOSPITAL_COMMUNITY): Payer: Medicare Other

## 2018-12-09 DIAGNOSIS — K1231 Oral mucositis (ulcerative) due to antineoplastic therapy: Secondary | ICD-10-CM | POA: Insufficient documentation

## 2018-12-09 NOTE — Telephone Encounter (Signed)
No los °

## 2018-12-09 NOTE — Assessment & Plan Note (Signed)
She has no signs of blisters on mouth sores I recommend conservative management with baking soda mixed with salt water gargle only. Hopefully, with changes made to her chemotherapy treatment, she will have some breaks from treatment to allow recovery.

## 2018-12-09 NOTE — Assessment & Plan Note (Signed)
She will continue taking her pain medicine as prescribed I recommend her to continue on pain medication along with Tylenol as needed

## 2018-12-09 NOTE — Progress Notes (Signed)
Cardiology Office Note  Date: 12/10/2018   ID: [name], DOB 1932/11/20, MRN [id number]  PCP: Asencion Noble, MD  Primary Cardiologist: Rozann Lesches, MD   Chief Complaint  Patient presents with  . Atrial Fibrillation    History of Present Illness: [name] is an 85 y.o. female last seen in November 2019.  She is here with her daughter for a follow-up visit.  He is recently had a follow-up chemotherapy infusion with Gemzar and Carboplatin.  From a cardiac perspective she reports no palpitations, had no obvious spontaneous bleeding problems on Xarelto.  She does remain chronically anemic in the setting of her comorbidities.  I reviewed recent lab work obtained below.  Lasix was increased to 40 mg daily at the last visit.  She saw Dr. Servando Snare in January for follow-up of Pleurx catheter and right pleural effusion.  Will draining on a regular basis, most recently 125 cc by report.  I am not certain whether she would be a candidate for pleurodesis at this point, but it seems fairly likely that she will have recurring effusions.  Past Medical History:  Diagnosis Date  . Anxiety   . Chronic blood loss anemia    03-04-2018 diverticular bleed and rectal bleeding,  transfused 2 units PRBCs 03-08-2018  . Colitis   . COPD (chronic obstructive pulmonary disease) (HCC)    Dr. Lamonte Sakai  . Depression   . Diastolic CHF, chronic ([place])   . Diverticulosis   . Family history of colon cancer   . Fibromyalgia   . Genetic testing 04/07/2018   MyRisk (35 genes) @ [facility] - No pathogenic mutations detected  . GERD (gastroesophageal reflux disease)   . Hemorrhoids   . Hiatal hernia   . Hip pain 07/2018   Right Hip Pain  . History of rectal polyps   . History of shingles   . Hypothyroidism   . IBS (irritable bowel syndrome)   . Lymphocytic colitis    Dr. Henrene Pastor  . Malignant ascites    Admission 06/2017 abdominal s/p parencentesis 07-01-2017 2.5L, 07-08-2016  2.7L, 07-12-2017  1425m  .  Neuropathy due to chemotherapeutic drug ([facility]   . Osteoporosis   . Ovarian cancer ([facility]    Chemotherapy - Dr. GAlvy Bimler . Paroxysmal atrial fibrillation ([facility]    Xarelto stopped 03-07-2018 due to lower GI bleed  . Pleural effusion    s/p  right thoracentesis, 02-2018 1.3L and 03-17-2018 right thoracentesis 6465m, post cxr no residual effusion  . Psoriatic arthritis ([facility]  . Schatzki's ring 2013  . Seasonal allergic rhinitis     Past Surgical History:  Procedure Laterality Date  . CARDIOVASCULAR STRESS TEST  09/23/2012   Low risk lexiscan nuclear study w/ apical thinning but no evidence of ischemia/  normal LV function and wall motion , ef 75%  . CATARACT EXTRACTION W/ INTRAOCULAR LENS  IMPLANT, BILATERAL  10/2016  . CHEST TUBE INSERTION Right 06/24/2018   Procedure: INSERTION PLEURAL DRAINAGE CATHETER;  Surgeon: GeGrace IsaacMD;  Location: [facility] Service: Thoracic;  Laterality: Right;  . COLONOSCOPY    . DEBULKING N/A 03/20/2018   Procedure: DEBULKING;  Surgeon: PhIsabel CapriceMD;  Location: [facility];  Service: Gynecology;  Laterality: N/A;  . EXAM UNDER ANESTHESIA WITH MANIPULATION OF KNEE Left 12-20-2003  dr waNoemi Chapel post [facility]KA  . FEMUR IM NAIL Left 12/11/2013   Procedure: INTRAMEDULLARY (IM) NAIL FEMORAL;  Surgeon: FrGearlean AlfMD;  Location:  [facility];  Service: Orthopedics;  Laterality: Left;  . HYSTERECTOMY ABDOMINAL WITH SALPINGO-OOPHORECTOMY Bilateral 03/20/2018   Procedure: TOTAL HYSTERECTOMY ABDOMINAL WITH BILATERAL  SALPINGO-OOPHORECTOMY;  Surgeon: Isabel Caprice, MD;  Location: [facility];  Service: Gynecology;  Laterality: Bilateral;  . IR FLUORO GUIDE PORT INSERTION RIGHT  07/22/2017  . IR PARACENTESIS  07/12/2017  . IR THORACENTESIS ASP PLEURAL SPACE W/IMG GUIDE  03/17/2018  . IR THORACENTESIS ASP PLEURAL SPACE W/IMG GUIDE  05/08/2018  . IR US GUIDE VASC ACCESS RIGHT  07/22/2017  . KNEE ARTHROSCOPY W/ LATERAL RELEASE Left 09-03-2005   dr Noemi Chapel  [facility]   w/  Lysis  Adhesions,  excision loose body's  . LAPAROSCOPIC CHOLECYSTECTOMY  12-04-2010  dr zeigler  . LAPAROTOMY N/A 03/20/2018   Procedure: EXPLORATORY LAPAROTOMY;  Surgeon: Isabel Caprice, MD;  Location: [facility];  Service: Gynecology;  Laterality: N/A;  . OMENTECTOMY N/A 03/20/2018   Procedure: OMENTECTOMY;  Surgeon: Isabel Caprice, MD;  Location: [facility];  Service: Gynecology;  Laterality: N/A;  . OTHER SURGICAL HISTORY  06/24/2018   Plurex Catheter inserted into the lungs  . TOTAL KNEE ARTHROPLASTY Left 09-08-2003   dr Noemi Chapel  [facility]  . TOTAL KNEE REVISION  08/06/2012   Procedure: TOTAL KNEE REVISION;  Surgeon: [name], MD;  Location: [facility];  Service: Orthopedics;  Laterality: Left;  Left Total Knee Arthroplasty Revision  . TRANSTHORACIC ECHOCARDIOGRAM  08/20/2017   ef 60-65%,  grade 1 diastolic dysfunction/  trivial AR and TR  . Uterine polypectomy      Current Outpatient Medications  Medication Sig Dispense Refill  . albuterol (PROAIR HFA) 108 (90 Base) MCG/ACT inhaler Inhale 2 puffs into the lungs every 6 (six) hours as needed for wheezing or shortness of breath. 1 Inhaler 3  . ALPRAZolam (XANAX) 0.25 MG tablet Take 1 tablet (0.25 mg total) by mouth at bedtime as needed for anxiety. 5 tablet 0  . amoxicillin (AMOXIL) 500 MG capsule TAKE 4 CAPSULES PO 1 HOUR PRIOR TO DENTAL APPOINTMENT  1  . azelastine (ASTELIN) 0.1 % nasal spray Place 2 sprays into both nostrils 2 (two) times daily as needed for allergies.     Marland Kitchen CARTIA XT 120 MG 24 hr capsule     . Cholecalciferol (VITAMIN D) 2000 units CAPS Take 2,000 Units by mouth daily.     Marland Kitchen dicyclomine (BENTYL) 10 MG capsule Take 1 tab by mouth every morning. May take twice daily as needed. (Patient taking differently: Take 10 mg by mouth 2 (two) times daily. ) 60 capsule 6  . diltiazem (DILACOR XR) 180 MG 24 hr capsule Take 180 mg by mouth every morning.    . diltiazem (TIAZAC) 120 MG 24 hr capsule Take 120 mg by mouth daily. At night time    .  diphenhydrAMINE (BENADRYL) 25 mg capsule Take 25 mg by mouth daily as needed for itching.     . diphenoxylate-atropine (LOMOTIL) 2.5-0.025 MG tablet Take 1 tablet by mouth 4 (four) times daily. (Patient taking differently: Take 1 tablet by mouth 4 (four) times daily as needed for diarrhea or loose stools. ) 360 tablet 1  . DULoxetine (CYMBALTA) 60 MG capsule Take 1 capsule (60 mg total) by mouth daily. 30 capsule 3  . fluticasone (FLONASE) 50 MCG/ACT nasal spray Place 2 sprays into both nostrils 2 (two) times daily as needed (FOR NASAL CONGESTION.).     Marland Kitchen furosemide (LASIX) 40 MG tablet Take 1 tablet (40 mg total) by mouth daily.  90 tablet 3  . halobetasol (ULTRAVATE) 0.05 % cream Apply 1 application topically 2 (two) times daily as needed (psoriasis).     Marland Kitchen levothyroxine (SYNTHROID, LEVOTHROID) 137 MCG tablet Take 137 mcg by mouth daily before breakfast. For thyroid therapy    . Magnesium 400 MG CAPS Take 400 mg by mouth daily.    . mirtazapine (REMERON) 15 MG tablet TAKE 1 TABLET(15 MG) BY MOUTH AT BEDTIME (Patient taking differently: Take 15 mg by mouth at bedtime. ) 30 tablet 11  . Oxycodone HCl 10 MG TABS Take 1 tablet (10 mg total) by mouth every 6 (six) hours as needed. 60 tablet 0  . polyvinyl alcohol (LIQUIFILM TEARS) 1.4 % ophthalmic solution Place 1 drop into both eyes 2 (two) times daily as needed for dry eyes.     . potassium chloride SA (K-DUR,KLOR-CON) 20 MEQ tablet TAKE 1 TABLET BY MOUTH DAILY AND TAKE 1 ADDITIONAL TABLET WHEN TAKING EXTRA LASIX DOSE 60 tablet 6  . STIOLTO RESPIMAT 2.5-2.5 MCG/ACT AERS INHALE 2 PUFFS INTO THE LUNGS DAILY 4 g 5  . XARELTO 20 MG TABS tablet TAKE 1 TABLET BY MOUTH EVERY DAY AFTER SUPPER 90 tablet 3   No current facility-administered medications for this visit.    Allergies:  Codeine   Social History: The patient  reports that she quit smoking about 38 years ago. Her smoking use included cigarettes. She has a 20.00 pack-year smoking history. She has  never used smokeless tobacco. She reports previous alcohol use. She reports that she does not use drugs.   ROS:  Please see the history of present illness. Otherwise, complete review of systems is positive for fatigue, intermittent increased abdominal girth.  All other systems are reviewed and negative.   Physical Exam: VS:  BP (!) 142/54   Pulse 96   Ht 5' 4" (1.626 m)   Wt 177 lb (80.3 kg)   SpO2 96%   BMI 30.38 kg/m , BMI Body mass index is 30.38 kg/m.  Wt Readings from Last 3 Encounters:  12/10/18 177 lb (80.3 kg)  12/08/18 180 lb 6.4 oz (81.8 kg)  12/01/18 176 lb 6.4 oz (80 kg)    General: Elderly woman, appears comfortable at rest. HEENT: Conjunctiva and lids normal, oropharynx clear. Neck: Supple, no elevated JVP or carotid bruits, no thyromegaly. Lungs: Decreased breath sounds with few crackles at the right base, nonlabored breathing at rest. Cardiac: Regular rate and rhythm, no S3, soft systolic murmur. Abdomen: Mildly protuberant, nontender, bowel sounds present. Extremities: No significant lower leg edema, distal pulses 2+. Skin: Warm and dry. Musculoskeletal: No kyphosis. Neuropsychiatric: Alert and oriented x3, affect grossly appropriate.  ECG: I personally reviewed the tracing from 03/22/2018 which showed a narrow complex regular tachycardia rate is rapid atrial fibrillation although possibly an atrial tachycardia.  Recent Labwork: 11/24/2018: Magnesium 1.7 12/08/2018: ALT 21; AST 27; BUN 16; Creatinine, Ser 1.09; Hemoglobin 9.1; Platelets 210; Potassium 4.0; Sodium 136   Other Studies Reviewed Today:  Echocardiogram 08/20/2017: Study Conclusions  - Left ventricle: The cavity size was normal. Wall thickness was normal. Systolic function was normal. The estimated ejection fraction was in the range of 60% to 65%. Wall motion was normal; there were no regional wall motion abnormalities. Doppler parameters are consistent with abnormal left  ventricular relaxation (grade 1 diastolic dysfunction). - Aortic valve: There was no stenosis. There was trivial regurgitation. - Mitral valve: Mildly calcified annulus. There was no significant regurgitation. - Right ventricle: The cavity size was normal.  Systolic function was normal. - Tricuspid valve: Peak RV-RA gradient (S): 27 mm Hg. - Pulmonary arteries: PA peak pressure: 30 mm Hg (S). - Inferior vena cava: The vessel was normal in size. The respirophasic diameter changes were in the normal range (= 50%), consistent with normal central venous pressure.  Impressions:  - Normal LV size with EF 60-65%. Normal RV size and systolic function. No significant valvular abnormalities.  Assessment and Plan:  1.  Paroxysmal atrial fibrillation with CHADSVASC score of 3.  Plan to continue Xarelto at this time, no spontaneous bleeding episodes.  I reviewed her recent lab work.  Heart rate control looks to be adequate on present dose of Cardizem CD.  2.  Ovarian cancer undergoing active chemotherapy.  She continues to follow with Dr. Alvy Bimler.  3.  Recurring right pleural effusion, Pleurx catheter in place.  She continues on Lasix with potassium supplements.  Follow-up with Dr. Servando Snare pending.  Current medicines were reviewed with the patient today.  Disposition: Follow-up in 6 months.  Signed, Satira Sark, MD, [facility] 12/10/2018 11:19 AM    [facility] [place]. [address], [place], [place] [address] Phone: [phone]; Fax: [phone]

## 2018-12-09 NOTE — Assessment & Plan Note (Addendum)
Her recent imaging show positive response to therapy She will continue a few more cycles of therapy She will continue aggressive transfusion support while on treatment We will continue dose adjustment for treatment; due to severe pancytopenia, I recommend her treatment cycle to be 1 week on, 1 week off, for cycle of every 28 days.

## 2018-12-09 NOTE — Progress Notes (Signed)
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  Anemia, chronic disease She has received blood transfusion recently She denies signs of bleeding She will continue her treatment as schedule and return next week for blood count monitoring and transfusion as needed, to keep hemoglobin greater than 8  Cancer associated pain She will continue taking her pain medicine as prescribed I recommend her to continue on pain medication along with Tylenol as needed  Chronic atrial fibrillation She has chronic atrial fibrillation and on chronic anticoagulation therapy For now, she will continue on chronic anticoagulation therapy and we will continue aggressive transfusion support as needed  Mucositis due to chemotherapy She has no signs of blisters on mouth sores I recommend conservative management with baking soda mixed with salt water gargle only. Hopefully, with changes made to her chemotherapy treatment, she will have some breaks from treatment to allow recovery.  Pleural effusion She has persistent reduced breath sounds on lung bases.  She will continue close monitoring.   No orders of the defined types were placed in this encounter.   INTERVAL HISTORY: Please see below for  problem oriented charting. She returns with her daughter for further chemotherapy and follow-up She has developed some mucositis recently that resolved No nausea, vomiting or changes in bowel habits The patient denies any recent signs or symptoms of bleeding such as spontaneous epistaxis, hematuria or hematochezia. She continues to have chronic fatigue and shortness of breath on minimal exertion No worsening neuropathy Pain control is stable.  SUMMARY OF ONCOLOGIC HISTORY: Oncology History   High grade serous ER 90%, PR 0% BRCA 1: no loss of expression MMR normal      Left ovarian epithelial cancer ([place])   02/18/2016 Tumor Marker    Patient's tumor was tested for the following markers: CA125 Results of the tumor marker test revealed 45    05/22/2016 Tumor Marker    Patient's tumor was tested for the following markers: CA125 Results of the tumor marker test revealed 53    05/22/2016 Imaging    Outside pelvic US showed 4.1 cm adnexa mass    06/24/2017 Imaging    Ct abdomen and pelvis:  1. Interim finding of moderate ascites within the abdomen and pelvis with additional finding of diffuse nodular infiltration of the omentum and anterior mesenteric fat, the appearance would be consistent with peritoneal carcinomatosis/metastatic disease. Increasing retroperitoneal and upper abdominal adenopathy. 2. Re- demonstrated 3.8 cm cyst in the right adnexa. Enlarging soft tissue density in the left adnexa now with possible cystic component posteriorly. In light of the above findings, concern is for ovarian neoplasm. Correlation with pelvic ultrasound recommended. 3. Small right-sided pleural effusion, new since prior study 4. Stable hypodense splenic lesions since 2017.     06/25/2017 Imaging    US pelvis: 2.9 cm simple appearing cyst in the right  ovary. Left ovary grossly unremarkable. Large volume ascites in the pelvis    06/30/2017 - 07/01/2017 Hospital Admission    She was admitted for  evaluation of abdominal pain and ascites    07/01/2017 Pathology Results    PERITONEAL/ASCITIC FLUID(SPECIMEN 1 OF 1 COLLECTED 07/01/17): - POORLY DIFFERENTIATED CARCINOMA; SEE COMMENT Source Peritoneal/Ascitic Fluid, (specimen 1 of 1 collected 07/01/17) Gross Specimen: Received is/are 1000 cc's of brownish fluid. (BS:bs) Prepared: # Smears: 0 # Concentration Technique Slides (i.e. ThinPrep): 1 # Cell Block: 1 Additional Studies: Also received Hematology slide - [id number]. Comment The tumor cells are positive for cytokeratin 7 and Pax-8 but negative for cytokeratin 20, CDX-2, GATA-3, Napsin-A and TTF-1. Based on the immunoprofile a gynecology primary is favored    07/01/2017 Procedure    Successful ultrasound-guided diagnostic and therapeutic paracentesis yielding 2.5 liters of peritoneal fluid    07/07/2017 - 07/09/2017 Hospital Admission    She was admitted for management of malignant ascites    07/08/2017 Procedure    Successful ultrasound-guided therapeutic paracentesis yielding 2.7 liters liters of peritoneal fluid    07/12/2017 Procedure    Successful ultrasound-guided paracentesis yielding 1450 mL of peritoneal fluid    07/18/2017 - 07/24/2017 Hospital Admission    She was admitted for expedited treatment    07/18/2017 Tumor Marker    Patient's tumor was tested for the following markers: CA125 Results of the tumor marker test revealed 1941    07/19/2017 - 02/04/2018 Chemotherapy    The patient had 6 cycles of carboplatin & Taxol for chemotherapy treatment, followed by 3 more cycles of carboplatin only     07/19/2017 - 02/04/2018 Chemotherapy    The patient had carboplatin and taxol    08/06/2017 Procedure    Successful ultrasound-guided therapeutic paracentesis yielding 2.6 liters of peritoneal fluid.    08/09/2017 Tumor Marker    Patient's tumor was tested for the following markers: CA125 Results of the tumor marker test revealed 1665    08/15/2017 Tumor Marker     Patient's tumor was tested for the following markers: CA125 Results of the tumor marker test revealed 937.9    08/20/2017 Imaging    ECHO: Normal LV size with EF 60-65%. Normal RV size and systolic function. No significant valvular abnormalities.    09/18/2017 Imaging    Chest Impression:  1. No evidence thoracic metastasis. 2. Interval increase and RIGHT pleural effusion.  Abdomen / Pelvis Impression:  1. Interval decrease in intraperitoneal free fluid. 2. Interval decrease in omental nodularity in the LEFT ventral peritoneal space. 3. Interval decrease in nodularity associated with the LEFT ovary. 4. Cystic portion of the RIGHT ovary is increased mildly in size.    09/20/2017 Tumor Marker    Patient's tumor was tested for the following markers: CA125 Results of the tumor marker test revealed 347    10/14/2017 Tumor Marker    Patient's tumor was tested for the following markers: CA125 Results of the tumor marker test revealed 307.4    11/04/2017 Tumor Marker    Patient's tumor was tested for the following markers: CA125 Results of the tumor marker test revealed 262.5    11/28/2017 Imaging    1. Interval decrease in right pleural effusion with resolution of right atelectasis seen previously. 2. New small left pleural effusion, symmetric to the right. 3. No intraperitoneal free fluid on the current study. 4. Continued further decrease in left omental disease, appearing less confluent today than on the prior study. 5. Left ovary remains normal in  appearance today and the right adnexal cystic lesion is decreased in size compared to prior study. 6. 14 mm pancreatic cyst is unchanged. Continued attention on follow-up imaging recommended. 7. Aortic Atherosclerois (ICD10-170.0)    12/13/2017 Tumor Marker    Patient's tumor was tested for the following markers: CA125 Results of the tumor marker test revealed 197.7    01/03/2018 Tumor Marker    Patient's tumor was tested for the  following markers: CA125 Results of the tumor marker test revealed 183.1    01/14/2018 Tumor Marker    Patient's tumor was tested for the following markers: CA125 Results of the tumor marker test revealed 177.4    02/04/2018 Tumor Marker    Patient's tumor was tested for the following markers: CA125 Results of the tumor marker test revealed 168.5    02/25/2018 Imaging    1. Omental carcinomatosis appears qualitatively stable to slightly decreased. Stable mild peritoneal thickening in the paracolic gutters. Stable right adnexal cyst. No ascites. No new or progressive metastatic disease in the abdomen or pelvis. 2. Small dependent right pleural effusion is increased. 3. Cystic pancreatic body lesion is decreased and now subcentimeter in size, suggesting a benign lesion. 4. Aortic Atherosclerosis (ICD10-I70.0).    03/03/2018 - 03/07/2018 Hospital Admission    She was hospitalized for GI bleed requiring blood transfusions. Xarelto was placed on hold    03/07/2018 PET scan    1. Persistent hazy omental interstitial nodularity but no hypermetabolism or discrete measurable nodules. No abdominal ascites. 2. No findings for metastatic disease involving the chest. 3. Moderate-sized right pleural effusion and small left pleural effusion.     03/20/2018 Pathology Results    1. Ovary and fallopian tube, right - OVARY AND FALLOPIAN TUBE INVOLVED BY SEROUS CARCINOMA. - PARATUBAL CYST. 2. Uterus +/- tubes/ovaries, neoplastic, cervix, left ovary and fallopian tube - LEFT OVARY: HIGH GRADE SEROUS CARCINOMA WITH TREATMENT EFFECT, SPANNING 2.5 CM. CARCINOMA INVOLVES OVARIAN SURFACE. SEE ONCOLOGY TABLE. - LEFT FALLOPIAN TUBE: INVOLVED BY SEROUS CARCINOMA. - UTERUS: -ENDOMETRIUM: INACTIVE ENDOMETRIUM. NO HYPERPLASIA OR MALIGNANCY. -MYOMETRIUM: UNREMARKABLE. NO MALIGNANCY. -SEROSA: INVOLVED BY SEROUS CARCINOMA. - CERVIX: ENDOCERVICAL POLYP. NO MALIGNANCY. 3. Omentum, resection for tumor - INVOLVED BY  SEROUS CARCINOMA. 4. Soft tissue, biopsy, mesenteric nodule - INVOLVED BY SEROUS CARCINOMA. Microscopic Comment 2. OVARY or FALLOPIAN TUBE or PRIMARY PERITONEUM: Procedure: Total hysterectomy and bilateral salpingo-oophorectomy. Omentectomy. Mesenteric lymph node biopsy. Specimen Integrity: Intact. Tumor Site: Left ovary. Ovarian Surface Involvement (required only if applicable): Present. Fallopian Tube Surface Involvement (required only if applicable): Present, bilateral. Tumor Size: 2.5 cm. Histologic Type: High grade serous carcinoma. Histologic Grade: High grade. Implants (required for advanced stage serous/seromucinous borderline tumors only): N/A. Other Tissue/ Organ Involvement: Bilateral fallopian tubes, right ovary, uterine serosa, omentum. Largest Extrapelvic Peritoneal Focus (required only if applicable): Microscopic, estimated 0.5 cm (omentum). Peritoneal/Ascitic Fluid: Prior Positive ([id number]). Treatment Effect (required only for high-grade serous carcinomas): Present in left ovary. CRS2. Regional Lymph Nodes: No lymph nodes submitted/identified. Pathologic Stage Classification (pTNM, AJCC 8th Edition): ypT3b, ypNX Representative Tumor Block: 1A, 1B, 65F, 7F. Comment(s): The right ovary has only surface deposits with a large paratubal cyst. The left ovary has intraparenchymal tumor with associated treatment effect. Thus the tumor location is classified as a left ovarian primary.    03/20/2018 Surgery    Procedure(s) Performed:  1. Exploratory laparotomy with total hysterectomy and bilateral salpingo-oophorectomy 2. Infragastic Omentectomy  3. Debulking to <1cm gross residual disease   Surgeon: Mart Piggs, MD  Specimens: Uterus Cervix, Bilateral tubes / ovaries and omentum. Mesenteric nodule.  Operative Findings: Debulked to gross residual disease <1cm; however there is miliary disease in multiple locations including the majority of the abdominal  peritoneum (anterior abdominal wall, bilateral gutters), diaphragm (Right>left), majority of small bowel mesentary. Normal appendix. Normal small uterus. Right ovary with a cystic lesion ~3cm, some adhesive disease of right adnexa to rectum/sigmoid. Gross omental disease, which was resected with the omentectomy. Smooth liver surface, but again, diaphragmatic disease noted.       03/20/2018 Genetic Testing    Patient has genetic testing done for ER/PR. Results revealed patient has ER: 90%, PR 0%.     03/31/2018 Tumor Marker    Patient's tumor was tested for the following markers: CA125 Results of the tumor marker test revealed 215.8     Genetic Testing    Patient has genetic testing done for BRCA 1. Results revealed patient has the following: BRCA 1: no loss of expression.     Genetic Testing    Patient has genetic testing done for MMR . Results revealed patient has the following:  MMR: normal    03/31/2018 Genetic Testing    Patient has genetic testing done for BRCA1/2. Results revealed patient has no actionable mutations. She is found to have [place] genetic change of unknown significance    04/21/2018 Imaging    1. No definite findings of residual or recurrent metastatic disease in the abdomen or pelvis status post interval TAHBSO and omentectomy. Stable minimal thickening in the paracolic gutters without discrete peritoneal nodularity. 2. Trace free fluid in the pelvic cul-de-sac. 3. Stable small dependent right pleural effusion. 4. Subcentimeter pancreatic body cystic lesion is stable to slightly decreased. 5. Aortic Atherosclerosis (ICD10-I70.0).    04/21/2018 Tumor Marker    Patient's tumor was tested for the following markers: CA125 Results of the tumor marker test revealed 187.3    04/23/2018 - 09/12/2018 Anti-estrogen oral therapy    She is placed on Femara    05/08/2018 Procedure    Successful ultrasound guided right thoracentesis yielding 800 mL of pleural fluid. Fluid  cytology is negative for malignancy     05/28/2018 Tumor Marker    Patient's tumor was tested for the following markers: CA125 Results of the tumor marker test revealed 190    06/30/2018 Tumor Marker    Patient's tumor was tested for the following markers: CA125 Results of the tumor marker test revealed 148    07/23/2018 Imaging    Status post hysterectomy and bilateral salpingo-oophorectomy.  Very mild peritoneal thickening/nodularity, equivocal but worrisome for very mild peritoneal disease. Attention on follow-up is suggested.  Small right pleural effusion with indwelling pleural drain.    07/23/2018 Tumor Marker    Patient's tumor was tested for the following markers: CA125 Results of the tumor marker test revealed 215    09/11/2018 Tumor Marker    Patient's tumor was tested for the following markers: CA-125. Results of the tumor marker test revealed 1323    09/12/2018 Imaging    1. Mildly progressive ascites with some mild degree of nodularity most appreciable along the left adnexa, right paracolic gutter, right upper quadrant, compatible with peritoneal spread of tumor. 2. Small right pleural effusion with indwelling right pleural catheter again noted. Mild increase in atelectasis anteriorly at the right lung base. 3. Aortic Atherosclerosis (ICD10-I70.0). Coronary atherosclerosis. 4. Several tiny hypodense lesions in the spleen are technically nonspecific, but stable. 5. Air fluid level in the rectum compatible with diarrheal  process. 6. Prominent bilateral hip arthropathy. Left hip screw noted.    09/22/2018 -  Chemotherapy    The patient had carboplatin and gemzar    09/29/2018 Tumor Marker    Patient's tumor was tested for the following markers: CA125 Results of the tumor marker test revealed 1219    09/29/2018 Adverse Reaction    Cycle 1 day 8 of Gemzar was omitted due to severe anemia    10/20/2018 Tumor Marker    Patient's tumor was tested for the following  markers: CA125 Results of the tumor marker test revealed 402    11/13/2018 Tumor Marker    Patient's tumor was tested for the following markers: CA125 Results of the tumor marker test revealed 437    11/26/2018 Imaging    1. Mild residual peritoneal thickening, overall decreased from 09/11/2018, with resolution of previously seen ascites. 2. Tiny residual right pleural effusion with small bore chest tube in place. 3. Supraumbilical midline ventral hernia has a wide neck and contains unobstructed colon. 4.  Aortic atherosclerosis (ICD10-170.0).      REVIEW OF SYSTEMS:   Constitutional: Denies fevers, chills or abnormal weight loss Eyes: Denies blurriness of vision Gastrointestinal:  Denies nausea, heartburn or change in bowel habits Skin: Denies abnormal skin rashes Lymphatics: Denies new lymphadenopathy or easy bruising Neurological:Denies numbness, tingling or new weaknesses Behavioral/Psych: Mood is stable, no new changes  All other systems were reviewed with the patient and are negative.  I have reviewed the past medical history, past surgical history, social history and family history with the patient and they are unchanged from previous note.  ALLERGIES:  is allergic to codeine.  MEDICATIONS:  Current Outpatient Medications  Medication Sig Dispense Refill  . albuterol (PROAIR HFA) 108 (90 Base) MCG/ACT inhaler Inhale 2 puffs into the lungs every 6 (six) hours as needed for wheezing or shortness of breath. 1 Inhaler 3  . ALPRAZolam (XANAX) 0.25 MG tablet Take 1 tablet (0.25 mg total) by mouth at bedtime as needed for anxiety. 5 tablet 0  . amoxicillin (AMOXIL) 500 MG capsule TAKE 4 CAPSULES PO 1 HOUR PRIOR TO DENTAL APPOINTMENT  1  . azelastine (ASTELIN) 0.1 % nasal spray Place 2 sprays into both nostrils 2 (two) times daily as needed for allergies.     Marland Kitchen CARTIA XT 120 MG 24 hr capsule     . Cholecalciferol (VITAMIN D) 2000 units CAPS Take 2,000 Units by mouth daily.     Marland Kitchen  dicyclomine (BENTYL) 10 MG capsule Take 1 tab by mouth every morning. May take twice daily as needed. (Patient taking differently: Take 10 mg by mouth 2 (two) times daily. ) 60 capsule 6  . diltiazem (DILACOR XR) 180 MG 24 hr capsule Take 180 mg by mouth every morning.    . diltiazem (TIAZAC) 120 MG 24 hr capsule Take 120 mg by mouth daily. At night time    . diphenhydrAMINE (BENADRYL) 25 mg capsule Take 25 mg by mouth daily as needed for itching.     . diphenoxylate-atropine (LOMOTIL) 2.5-0.025 MG tablet Take 1 tablet by mouth 4 (four) times daily. (Patient taking differently: Take 1 tablet by mouth 4 (four) times daily as needed for diarrhea or loose stools. ) 360 tablet 1  . DULoxetine (CYMBALTA) 60 MG capsule Take 1 capsule (60 mg total) by mouth daily. 30 capsule 3  . fluticasone (FLONASE) 50 MCG/ACT nasal spray Place 2 sprays into both nostrils 2 (two) times daily as needed (FOR NASAL CONGESTION.).     Marland Kitchen  furosemide (LASIX) 40 MG tablet Take 1 tablet (40 mg total) by mouth daily. 90 tablet 3  . halobetasol (ULTRAVATE) 0.05 % cream Apply 1 application topically 2 (two) times daily as needed (psoriasis).     Marland Kitchen levothyroxine (SYNTHROID, LEVOTHROID) 137 MCG tablet Take 137 mcg by mouth daily before breakfast. For thyroid therapy    . Magnesium 400 MG CAPS Take 400 mg by mouth daily.    . mirtazapine (REMERON) 15 MG tablet TAKE 1 TABLET(15 MG) BY MOUTH AT BEDTIME (Patient taking differently: Take 15 mg by mouth at bedtime. ) 30 tablet 11  . Oxycodone HCl 10 MG TABS Take 1 tablet (10 mg total) by mouth every 6 (six) hours as needed. 60 tablet 0  . polyvinyl alcohol (LIQUIFILM TEARS) 1.4 % ophthalmic solution Place 1 drop into both eyes 2 (two) times daily as needed for dry eyes.     . potassium chloride SA (K-DUR,KLOR-CON) 20 MEQ tablet TAKE 1 TABLET BY MOUTH DAILY AND TAKE 1 ADDITIONAL TABLET WHEN TAKING EXTRA LASIX DOSE 60 tablet 6  . STIOLTO RESPIMAT 2.5-2.5 MCG/ACT AERS INHALE 2 PUFFS INTO THE  LUNGS DAILY 4 g 5  . XARELTO 20 MG TABS tablet TAKE 1 TABLET BY MOUTH EVERY DAY AFTER SUPPER 90 tablet 3   No current facility-administered medications for this visit.     PHYSICAL EXAMINATION: ECOG PERFORMANCE STATUS: 2 - Symptomatic, <50% confined to bed  Vitals:   12/08/18 0816  BP: 102/73  Pulse: (!) 104  Resp: 18  Temp: 98.1 F (36.7 C)  SpO2: 93%   Filed Weights   12/08/18 0816  Weight: 180 lb 6.4 oz (81.8 kg)    GENERAL:alert, no distress and comfortable SKIN: skin color, texture, turgor are normal, no rashes or significant lesions EYES: normal, Conjunctiva are pink and non-injected, sclera clear OROPHARYNX:no exudate, no erythema and lips, buccal mucosa, and tongue normal  NECK: supple, thyroid normal size, non-tender, without nodularity LYMPH:  no palpable lymphadenopathy in the cervical, axillary or inguinal LUNGS: She has reduced breath sounds on both lung bases  hEART: regular rate & rhythm and no murmurs and no lower extremity edema ABDOMEN:abdomen soft, non-tender and normal bowel sounds Musculoskeletal:no cyanosis of digits and no clubbing  NEURO: alert & oriented x 3 with fluent speech, no focal motor/sensory deficits  LABORATORY DATA:  I have reviewed the data as listed    Component Value Date/Time   NA 136 12/08/2018 0742   NA 135 (L) 09/24/2017 0945   K 4.0 12/08/2018 0742   K 3.7 09/24/2017 0945   CL 98 12/08/2018 0742   CO2 29 12/08/2018 0742   CO2 30 (H) 09/24/2017 0945   GLUCOSE 135 (H) 12/08/2018 0742   GLUCOSE 101 09/24/2017 0945   BUN 16 12/08/2018 0742   BUN 15.3 09/24/2017 0945   CREATININE 1.09 (H) 12/08/2018 0742   CREATININE 1.00 03/13/2018 0940   CREATININE 0.8 09/24/2017 0945   CALCIUM 8.7 (L) 12/08/2018 0742   CALCIUM 9.1 09/24/2017 0945   PROT 6.7 12/08/2018 0742   PROT 6.6 09/24/2017 0945   ALBUMIN 2.8 (L) 12/08/2018 0742   ALBUMIN 3.3 (L) 09/24/2017 0945   AST 27 12/08/2018 0742   AST 14 03/13/2018 0940   AST 16  09/24/2017 0945   ALT 21 12/08/2018 0742   ALT 7 03/13/2018 0940   ALT 12 09/24/2017 0945   ALKPHOS 68 12/08/2018 0742   ALKPHOS 52 09/24/2017 0945   BILITOT 0.2 (L) 12/08/2018 0742   BILITOT  0.3 03/13/2018 0940   BILITOT 1.13 09/24/2017 0945   GFRNONAA 47 (L) 12/08/2018 0742   GFRNONAA 51 (L) 03/13/2018 0940   GFRAA 54 (L) 12/08/2018 0742   GFRAA 59 (L) 03/13/2018 0940    No results found for: SPEP, UPEP  Lab Results  Component Value Date   WBC 4.0 12/08/2018   NEUTROABS 2.2 12/08/2018   HGB 9.1 (L) 12/08/2018   HCT 28.9 (L) 12/08/2018   MCV 88.1 12/08/2018   PLT 210 12/08/2018      Chemistry      Component Value Date/Time   NA 136 12/08/2018 0742   NA 135 (L) 09/24/2017 0945   K 4.0 12/08/2018 0742   K 3.7 09/24/2017 0945   CL 98 12/08/2018 0742   CO2 29 12/08/2018 0742   CO2 30 (H) 09/24/2017 0945   BUN 16 12/08/2018 0742   BUN 15.3 09/24/2017 0945   CREATININE 1.09 (H) 12/08/2018 0742   CREATININE 1.00 03/13/2018 0940   CREATININE 0.8 09/24/2017 0945      Component Value Date/Time   CALCIUM 8.7 (L) 12/08/2018 0742   CALCIUM 9.1 09/24/2017 0945   ALKPHOS 68 12/08/2018 0742   ALKPHOS 52 09/24/2017 0945   AST 27 12/08/2018 0742   AST 14 03/13/2018 0940   AST 16 09/24/2017 0945   ALT 21 12/08/2018 0742   ALT 7 03/13/2018 0940   ALT 12 09/24/2017 0945   BILITOT 0.2 (L) 12/08/2018 0742   BILITOT 0.3 03/13/2018 0940   BILITOT 1.13 09/24/2017 0945       RADIOGRAPHIC STUDIES: I have personally reviewed the radiological images as listed and agreed with the findings in the report. Ct Abdomen Pelvis W Contrast  Result Date: 11/26/2018 CLINICAL DATA:  Ovarian cancer chemotherapy in progress. EXAM: CT ABDOMEN AND PELVIS WITH CONTRAST TECHNIQUE: Multidetector CT imaging of the abdomen and pelvis was performed using the standard protocol following bolus administration of intravenous contrast. CONTRAST:  140m OMNIPAQUE IOHEXOL 300 MG/ML  SOLN COMPARISON:   09/11/2018 and 09/18/2017. FINDINGS: Lower chest: 3 mm peripheral right lower lobe nodule (series 7, image 10) unchanged from 09/18/2017. Small bore chest tube is seen within a tiny right pleural effusion. Heart size normal. No pericardial effusion. Distal esophagus is grossly unremarkable. Hepatobiliary: Slight marginal irregularity of the liver may be due to known peritoneal disease. Liver is otherwise unremarkable. Common bile duct dilatation is unchanged and likely related to cholecystectomy. Pancreas: Negative. Spleen: Negative. Adrenals/Urinary Tract: Adrenal glands and right kidney are unremarkable. Subcentimeter low-attenuation lesion in the left kidney is too small to characterize. Ureters are decompressed. Bladder is low in volume. Stomach/Bowel: Stomach and small bowel are unremarkable. Appendix is not readily visualized. Unobstructed colon extends into a wide neck supraumbilical midline ventral hernia. Colon is otherwise unremarkable. Vascular/Lymphatic: Atherosclerotic calcification of the aorta without aneurysm. Retroaortic left renal vein. No pathologically enlarged lymph nodes. Reproductive: Hysterectomy.  No adnexal mass. Other: Slight residual peritoneal thickening along the right and left walls of the abdomen and pelvis, overall improved from 09/11/2018. Ascites has resolved. Musculoskeletal: Postoperative changes in the proximal femur. Degenerative changes spine and. No worrisome lytic or sclerotic lesions. L5 superior endplate compression is unchanged. IMPRESSION: 1. Mild residual peritoneal thickening, overall decreased from 09/11/2018, with resolution of previously seen ascites. 2. Tiny residual right pleural effusion with small bore chest tube in place. 3. Supraumbilical midline ventral hernia has a wide neck and contains unobstructed colon. 4.  Aortic atherosclerosis (ICD10-170.0). Electronically Signed   By: MLorin Picket  M.D.   On: 11/26/2018 12:16    All questions were answered. The  patient knows to call the clinic with any problems, questions or concerns. No barriers to learning was detected.  I spent 25 minutes counseling the patient face to face. The total time spent in the appointment was 30 minutes and more than 50% was on counseling and review of test results  Heath Lark, MD 12/09/2018 1:34 PM

## 2018-12-09 NOTE — Assessment & Plan Note (Signed)
She has received blood transfusion recently She denies signs of bleeding She will continue her treatment as schedule and return next week for blood count monitoring and transfusion as needed, to keep hemoglobin greater than 8

## 2018-12-09 NOTE — Assessment & Plan Note (Signed)
She has persistent reduced breath sounds on lung bases.  She will continue close monitoring.

## 2018-12-09 NOTE — Assessment & Plan Note (Signed)
She has chronic atrial fibrillation and on chronic anticoagulation therapy For now, she will continue on chronic anticoagulation therapy and we will continue aggressive transfusion support as needed

## 2018-12-10 ENCOUNTER — Ambulatory Visit (INDEPENDENT_AMBULATORY_CARE_PROVIDER_SITE_OTHER): Payer: Medicare Other | Admitting: Cardiology

## 2018-12-10 ENCOUNTER — Other Ambulatory Visit: Payer: Self-pay | Admitting: Cardiothoracic Surgery

## 2018-12-10 ENCOUNTER — Encounter: Payer: Self-pay | Admitting: Cardiology

## 2018-12-10 VITALS — BP 142/54 | HR 96 | Ht 64.0 in | Wt 177.0 lb

## 2018-12-10 DIAGNOSIS — C569 Malignant neoplasm of unspecified ovary: Secondary | ICD-10-CM

## 2018-12-10 DIAGNOSIS — J90 Pleural effusion, not elsewhere classified: Secondary | ICD-10-CM

## 2018-12-10 DIAGNOSIS — I480 Paroxysmal atrial fibrillation: Secondary | ICD-10-CM

## 2018-12-10 NOTE — Patient Instructions (Signed)

## 2018-12-11 ENCOUNTER — Ambulatory Visit
Admission: RE | Admit: 2018-12-11 | Discharge: 2018-12-11 | Disposition: A | Discharge status: Home / Self Care | Payer: Medicare Other | Source: Ambulatory Visit | Attending: Cardiothoracic Surgery | Admitting: Cardiothoracic Surgery

## 2018-12-11 ENCOUNTER — Ambulatory Visit (INDEPENDENT_AMBULATORY_CARE_PROVIDER_SITE_OTHER): Payer: Medicare Other | Admitting: Cardiothoracic Surgery

## 2018-12-11 ENCOUNTER — Encounter (HOSPITAL_COMMUNITY): Payer: Medicare Other

## 2018-12-11 VITALS — BP 112/66 | HR 92 | Resp 20 | Ht 64.0 in

## 2018-12-11 DIAGNOSIS — J90 Pleural effusion, not elsewhere classified: Secondary | ICD-10-CM | POA: Diagnosis not present

## 2018-12-11 DIAGNOSIS — Z9689 Presence of other specified functional implants: Secondary | ICD-10-CM | POA: Diagnosis not present

## 2018-12-11 NOTE — Progress Notes (Signed)
[address]       [place] [address]             [phone]      Monzerat S Holster [place] Medical Record #[id number] Date of Birth: [birth year]  Referring: Collene Gobble, MD Primary Care: Asencion Noble, MD Primary Cardiologist: Rozann Lesches, MD   Chief Complaint:   POST OP FOLLOW UP 06/24/2018 PREOPERATIVE DIAGNOSIS: Recurrent right pleural effusion with history of ovarian carcinoma. POSTOPERATIVE DIAGNOSIS: Recurrent right pleural effusion with history of ovarian carcinoma. SURGICAL PROCEDURE: Placement of right PleurX catheter with ultrasound and fluoroscopic guidance History of Present Illness:  Patient returns office today for follow-up visit after placement of a Pleurx catheter.  She has had the Pleurx in since September.  Overall she has had success with controlling the effusion.  She continues to drain it every 4 days.  It had decreased as low as 60 mL but then increased back up to 150.      Recent CT of the abdomen was done in follow-up after underlying malignancy     Past Medical History:  Diagnosis Date  . Anxiety   . Chronic blood loss anemia    03-04-2018 diverticular bleed and rectal bleeding,  transfused 2 units PRBCs 03-08-2018  . Colitis   . COPD (chronic obstructive pulmonary disease) (HCC)    Dr. Lamonte Sakai  . Depression   . Diastolic CHF, chronic ([place])   . Diverticulosis   . Family history of colon cancer   . Fibromyalgia   . Genetic testing 04/07/2018   MyRisk (35 genes) @ [facility] - No pathogenic mutations detected  . GERD (gastroesophageal reflux disease)   . Hemorrhoids   . Hiatal hernia   . Hip pain 07/2018   Right Hip Pain  . History of rectal polyps   . History of shingles   . Hypothyroidism   . IBS (irritable bowel syndrome)   . Lymphocytic colitis    Dr. Henrene Pastor  . Malignant ascites    Admission 06/2017 abdominal s/p parencentesis 07-01-2017 2.5L, 07-08-2016  2.7L, 07-12-2017  1438m  . Neuropathy due to  chemotherapeutic drug ([facility]   . Osteoporosis   . Ovarian cancer ([facility]    Chemotherapy - Dr. GAlvy Bimler . [place]ysmal atrial fibrillation ([place]    Xarelto stopped 03-07-2018 due to lower GI bleed  . Pleural effusion    s/p  right thoracentesis, 02-2018 1.3L and 03-17-2018 right thoracentesis 6430m, post cxr no residual effusion  . Psoriatic arthritis ([facility]  . Schatzki's ring 2013  . Seasonal allergic rhinitis      Social History   Tobacco Use  Smoking Status Former Smoker  . Packs/day: 1.00  . Years: 20.00  . Pack years: 20.00  . Types: Cigarettes  . Last attempt to quit: 10/08/1980  . Years since quitting: 38.2  Smokeless Tobacco Never Used    Social History   Substance and Sexual Activity  Alcohol Use Not Currently   Comment: rarely, 12-23-15 rarely     Allergies  Allergen Reactions  . Codeine Itching    Current Outpatient Medications  Medication Sig Dispense Refill  . albuterol (PROAIR HFA) 108 (90 Base) MCG/ACT inhaler Inhale 2 puffs into the lungs every 6 (six) hours as needed for wheezing or shortness of breath. 1 Inhaler 3  . ALPRAZolam (XANAX) 0.25 MG tablet Take 1 tablet (0.25 mg total) by mouth at bedtime as needed for anxiety. 5 tablet 0  . amoxicillin (AMOXIL)  500 MG capsule TAKE 4 CAPSULES PO 1 HOUR PRIOR TO DENTAL APPOINTMENT  1  . azelastine (ASTELIN) 0.1 % nasal spray Place 2 sprays into both nostrils 2 (two) times daily as needed for allergies.     Marland Kitchen CARTIA XT 120 MG 24 hr capsule     . Cholecalciferol (VITAMIN D) 2000 units CAPS Take 2,000 Units by mouth daily.     Marland Kitchen dicyclomine (BENTYL) 10 MG capsule Take 1 tab by mouth every morning. May take twice daily as needed. (Patient taking differently: Take 10 mg by mouth 2 (two) times daily. ) 60 capsule 6  . diltiazem (DILACOR XR) 180 MG 24 hr capsule Take 180 mg by mouth every morning.    . diltiazem (TIAZAC) 120 MG 24 hr capsule Take 120 mg by mouth daily. At night time    . diphenhydrAMINE (BENADRYL) 25 mg  capsule Take 25 mg by mouth daily as needed for itching.     . diphenoxylate-atropine (LOMOTIL) 2.5-0.025 MG tablet Take 1 tablet by mouth 4 (four) times daily. (Patient taking differently: Take 1 tablet by mouth 4 (four) times daily as needed for diarrhea or loose stools. ) 360 tablet 1  . DULoxetine (CYMBALTA) 60 MG capsule Take 1 capsule (60 mg total) by mouth daily. 30 capsule 3  . fluticasone (FLONASE) 50 MCG/ACT nasal spray Place 2 sprays into both nostrils 2 (two) times daily as needed (FOR NASAL CONGESTION.).     Marland Kitchen furosemide (LASIX) 40 MG tablet Take 1 tablet (40 mg total) by mouth daily. 90 tablet 3  . halobetasol (ULTRAVATE) 0.05 % cream Apply 1 application topically 2 (two) times daily as needed (psoriasis).     Marland Kitchen levothyroxine (SYNTHROID, LEVOTHROID) 137 MCG tablet Take 137 mcg by mouth daily before breakfast. For thyroid therapy    . Magnesium 400 MG CAPS Take 400 mg by mouth daily.    . mirtazapine (REMERON) 15 MG tablet TAKE 1 TABLET(15 MG) BY MOUTH AT BEDTIME (Patient taking differently: Take 15 mg by mouth at bedtime. ) 30 tablet 11  . Oxycodone HCl 10 MG TABS Take 1 tablet (10 mg total) by mouth every 6 (six) hours as needed. 60 tablet 0  . polyvinyl alcohol (LIQUIFILM TEARS) 1.4 % ophthalmic solution Place 1 drop into both eyes 2 (two) times daily as needed for dry eyes.     . potassium chloride SA (K-DUR,KLOR-CON) 20 MEQ tablet TAKE 1 TABLET BY MOUTH DAILY AND TAKE 1 ADDITIONAL TABLET WHEN TAKING EXTRA LASIX DOSE 60 tablet 6  . STIOLTO RESPIMAT 2.5-2.5 MCG/ACT AERS INHALE 2 PUFFS INTO THE LUNGS DAILY 4 g 5  . XARELTO 20 MG TABS tablet TAKE 1 TABLET BY MOUTH EVERY DAY AFTER SUPPER 90 tablet 3   No current facility-administered medications for this visit.        Physical Exam: BP 112/66   Pulse 92   Resp 20   Ht _0  (1.626 m)   SpO2 92% Comment: RA  BMI 30.38 kg/m  General appearance: alert, cooperative and no distress Head: Normocephalic, without obvious  abnormality, atraumatic Neck: no adenopathy, no carotid bruit, no JVD, supple, symmetrical, trachea midline and thyroid not enlarged, symmetric, no tenderness/mass/nodules Lymph nodes: Cervical, supraclavicular, and axillary nodes normal. Resp: clear to auscultation bilaterally Cardio: regular rate and rhythm, S1, S2 normal, no murmur, click, rub or gallop GI: soft, non-tender; bowel sounds normal; no masses,  no organomegaly Extremities: extremities normal, atraumatic, no cyanosis or edema and Homans sign is negative, no  sign of DVT Neurologic: Grossly normal    Diagnostic Studies & Laboratory data:     Recent Radiology Findings:  Dg Chest 2 View  Result Date: 12/11/2018 CLINICAL DATA:  Pleural effusion, shortness of breath, cough EXAM: CHEST - 2 VIEW COMPARISON:  10/23/2018 FINDINGS: There is hyperinflation of the lungs compatible with COPD. PleurX catheter in place on the right, stable. No pneumothorax or visible effusions. Heart is normal size. Bibasilar scarring. Right Port-A-Cath is unchanged. IMPRESSION: COPD.  Bibasilar scarring. Right PleurX catheter and Port-A-Cath unchanged. No visible pneumothorax. Electronically Signed   By: Rolm Baptise M.D.   On: 12/11/2018 11:18  I have independently reviewed the above radiology studies  and reviewed the findings with the patient.  Ct Abdomen Pelvis W Contrast  Result Date: 11/26/2018 CLINICAL DATA:  Ovarian cancer chemotherapy in progress. EXAM: CT ABDOMEN AND PELVIS WITH CONTRAST TECHNIQUE: Multidetector CT imaging of the abdomen and pelvis was performed using the standard protocol following bolus administration of intravenous contrast. CONTRAST:  161m OMNIPAQUE IOHEXOL 300 MG/ML  SOLN COMPARISON:  09/11/2018 and 09/18/2017. FINDINGS: Lower chest: 3 mm peripheral right lower lobe nodule (series 7, image 10) unchanged from 09/18/2017. Small bore chest tube is seen within a tiny right pleural effusion. Heart size normal. No pericardial effusion.  Distal esophagus is grossly unremarkable. Hepatobiliary: Slight marginal irregularity of the liver may be due to known peritoneal disease. Liver is otherwise unremarkable. Common bile duct dilatation is unchanged and likely related to cholecystectomy. Pancreas: Negative. Spleen: Negative. Adrenals/Urinary Tract: Adrenal glands and right kidney are unremarkable. Subcentimeter low-attenuation lesion in the left kidney is too small to characterize. Ureters are decompressed. Bladder is low in volume. Stomach/Bowel: Stomach and small bowel are unremarkable. Appendix is not readily visualized. Unobstructed colon extends into a wide neck supraumbilical midline ventral hernia. Colon is otherwise unremarkable. Vascular/Lymphatic: Atherosclerotic calcification of the aorta without aneurysm. Retroaortic left renal vein. No pathologically enlarged lymph nodes. Reproductive: Hysterectomy.  No adnexal mass. Other: Slight residual peritoneal thickening along the right and left walls of the abdomen and pelvis, overall improved from 09/11/2018. Ascites has resolved. Musculoskeletal: Postoperative changes in the proximal femur. Degenerative changes spine and. No worrisome lytic or sclerotic lesions. L5 superior endplate compression is unchanged. IMPRESSION: 1. Mild residual peritoneal thickening, overall decreased from 09/11/2018, with resolution of previously seen ascites. 2. Tiny residual right pleural effusion with small bore chest tube in place. 3. Supraumbilical midline ventral hernia has a wide neck and contains unobstructed colon. 4.  Aortic atherosclerosis (ICD10-170.0). Electronically Signed   By: MLorin PicketM.D.   On: 11/26/2018 12:16    I have independently reviewed the above radiology studies  and reviewed the findings with the patient.    Dg Chest 2 View  Result Date: 09/11/2018 CLINICAL DATA:  Shortness of breath. Pleural effusion. EXAM: CHEST - 2 VIEW COMPARISON:  08/14/2018 FINDINGS: Right jugular  Port-A-Cath remains in place terminating over the lower SVC, unchanged. A right-sided tunneled pleural catheter catheter is unchanged. The cardiomediastinal silhouette is unchanged with normal heart size. Aortic atherosclerosis is noted. The lungs remain hyperinflated with minimal scarring or atelectasis in the lung bases. No acute airspace consolidation or edema is identified. There is likely a trace right apical pneumothorax, and there is at most a trace right pleural effusion. No acute osseous abnormality is identified. IMPRESSION: Trace right apical pneumothorax and at most a trace right pleural effusion with right pleural catheter in place. No evidence of acute airspace disease. Electronically Signed  By: Logan Bores M.D.   On: 09/11/2018 14:35    Dg Chest 2 View  Result Date: 08/14/2018 CLINICAL DATA:  Follow-up right pleural effusion.  COPD. EXAM: CHEST - 2 VIEW COMPARISON:  07/16/2018 chest radiograph. FINDINGS: Right basilar chest tube terminates in the medial upper right pleural space, unchanged. Right internal jugular MediPort terminates in the lower third of the SVC. Stable cardiomediastinal silhouette with normal heart size. No convincing pneumothorax. No significant pleural effusion. Hyperinflated lungs. No pulmonary edema. No acute consolidative airspace disease. Stable minimal bibasilar scarring versus atelectasis. IMPRESSION: 1. Stable right chest tube with no appreciable residual pneumothorax. 2. No significant pleural effusion. 3. Stable minimal bibasilar scarring versus atelectasis. 4. Hyperinflated lungs, compatible with the provided history of COPD. Electronically Signed   By: Ilona Sorrel M.D.   On: 08/14/2018 09:40   DCt Abdomen Pelvis W Contrast  Result Date: 07/23/2018 CLINICAL DATA:  Ovarian cancer, diagnosed 07/2015, status post surgery with adjunct chemotherapy. Oral chemotherapy ongoing. Recurrent pleural effusions. EXAM: CT ABDOMEN AND PELVIS WITH CONTRAST TECHNIQUE:  Multidetector CT imaging of the abdomen and pelvis was performed using the standard protocol following bolus administration of intravenous contrast. CONTRAST:  160m OMNIPAQUE IOHEXOL 300 MG/ML  SOLN COMPARISON:  04/21/2018 FINDINGS: Lower chest: Trace right pleural effusion with indwelling pleural drain. Hepatobiliary: Liver is within normal limits. Status post cholecystectomy. No intrahepatic ductal dilatation. Dilated common duct, measuring 11 mm, chronic. Pancreas: Within normal limits. Spleen: Within normal limits. Adrenals/Urinary Tract: Adrenal glands within normal limits. Kidneys are within normal limits.  No hydronephrosis. Bladder is underdistended but unremarkable. Stomach/Bowel: Stomach is within normal limits. No evidence of bowel obstruction. Very mild sigmoid diverticulosis. Vascular/Lymphatic: No evidence of abdominal aortic aneurysm. Atherosclerotic calcifications of the abdominal aorta and branch vessels. No suspicious abdominopelvic lymphadenopathy. Reproductive: Status post hysterectomy and bilateral salpingo-oophorectomy. Other: No abdominopelvic ascites. Mild peritoneal thickening/nodularity on the left (series 2/images 58-59). Mild soft tissue nodularity along the right pelvic cul-de-sac adjacent to the rectum (series 2/image 58). While equivocal, these findings are worrisome for very mild peritoneal disease. Musculoskeletal: Degenerative changes of the visualized thoracolumbar spine. IMPRESSION: Status post hysterectomy and bilateral salpingo-oophorectomy. Very mild peritoneal thickening/nodularity, equivocal but worrisome for very mild peritoneal disease. Attention on follow-up is suggested. Small right pleural effusion with indwelling pleural drain. Electronically Signed   By: SJulian HyM.D.   On: 07/23/2018 08:37    Recent Lab Findings: Lab Results  Component Value Date   WBC 4.0 12/08/2018   HGB 9.1 (L) 12/08/2018   HCT 28.9 (L) 12/08/2018   PLT 210 12/08/2018   GLUCOSE  135 (H) 12/08/2018   CHOL 147 11/14/2017   ALT 21 12/08/2018   AST 27 12/08/2018   NA 136 12/08/2018   K 4.0 12/08/2018   CL 98 12/08/2018   CREATININE 1.09 (H) 12/08/2018   BUN 16 12/08/2018   CO2 29 12/08/2018   TSH 0.388 11/10/2017   INR 1.09 06/24/2018   HGBA1C 5.7 (H) 06/12/2013      Assessment / Plan:   The current drainage requirements are very close to removal of the Pleurx.  With the slight increase recently will await and see her back in 1 month.  She will continue with drainage once every 4 days and report to uKoreathe amount coming out.  If it remains low consider removal of the Pleurx within the next month.     EGrace IsaacMD      [address]  [place] [phone]  12/11/2018 12:03 PM

## 2018-12-16 ENCOUNTER — Inpatient Hospital Stay: Payer: Medicare Other

## 2018-12-16 ENCOUNTER — Encounter (HOSPITAL_COMMUNITY): Payer: Medicare Other

## 2018-12-16 ENCOUNTER — Other Ambulatory Visit: Payer: Self-pay | Admitting: Hematology and Oncology

## 2018-12-16 VITALS — BP 137/61 | HR 84 | Temp 98.5°F | Resp 16

## 2018-12-16 DIAGNOSIS — C562 Malignant neoplasm of left ovary: Secondary | ICD-10-CM

## 2018-12-16 DIAGNOSIS — D638 Anemia in other chronic diseases classified elsewhere: Secondary | ICD-10-CM

## 2018-12-16 DIAGNOSIS — Z5111 Encounter for antineoplastic chemotherapy: Secondary | ICD-10-CM | POA: Diagnosis not present

## 2018-12-16 LAB — COMPREHENSIVE METABOLIC PANEL
ALT: 21 U/L (ref 0–44)
AST: 20 U/L (ref 15–41)
Albumin: 2.9 g/dL — ABNORMAL LOW (ref 3.5–5.0)
Alkaline Phosphatase: 65 U/L (ref 38–126)
Anion gap: 7 (ref 5–15)
BUN: 18 mg/dL (ref 8–23)
CHLORIDE: 99 mmol/L (ref 98–111)
CO2: 28 mmol/L (ref 22–32)
Calcium: 8.3 mg/dL — ABNORMAL LOW (ref 8.9–10.3)
Creatinine, Ser: 1.11 mg/dL — ABNORMAL HIGH (ref 0.44–1.00)
GFR calc non Af Amer: 46 mL/min — ABNORMAL LOW (ref >60)
GFR, EST AFRICAN AMERICAN: 53 mL/min — AB (ref >60)
Glucose, Bld: 132 mg/dL — ABNORMAL HIGH (ref 70–99)
Potassium: 4.1 mmol/L (ref 3.5–5.1)
Sodium: 134 mmol/L — ABNORMAL LOW (ref 135–145)
Total Bilirubin: 0.2 mg/dL — ABNORMAL LOW (ref 0.3–1.2)
Total Protein: 6.5 g/dL (ref 6.5–8.1)

## 2018-12-16 LAB — CBC WITH DIFFERENTIAL/PLATELET
Abs Immature Granulocytes: 0.21 10*3/uL — ABNORMAL HIGH (ref 0.00–0.07)
Basophils Absolute: 0 10*3/uL (ref 0.0–0.1)
Basophils Relative: 0 %
EOS ABS: 0.1 10*3/uL (ref 0.0–0.5)
Eosinophils Relative: 2 %
HCT: 26.2 % — ABNORMAL LOW (ref 36.0–46.0)
Hemoglobin: 8.1 g/dL — ABNORMAL LOW (ref 12.0–15.0)
Immature Granulocytes: 3 %
Lymphocytes Relative: 17 %
Lymphs Abs: 1.1 10*3/uL (ref 0.7–4.0)
MCH: 27.8 pg (ref 26.0–34.0)
MCHC: 30.9 g/dL (ref 30.0–36.0)
MCV: 90 fL (ref 80.0–100.0)
Monocytes Absolute: 0.9 10*3/uL (ref 0.1–1.0)
Monocytes Relative: 14 %
Neutro Abs: 4.2 10*3/uL (ref 1.7–7.7)
Neutrophils Relative %: 64 %
PLATELETS: 109 10*3/uL — AB (ref 150–400)
RBC: 2.91 MIL/uL — ABNORMAL LOW (ref 3.87–5.11)
RDW: 18.4 % — AB (ref 11.5–15.5)
WBC: 6.5 10*3/uL (ref 4.0–10.5)
nRBC: 0 % (ref 0.0–0.2)

## 2018-12-16 LAB — PREPARE RBC (CROSSMATCH)

## 2018-12-16 LAB — SAMPLE TO BLOOD BANK

## 2018-12-16 MED ORDER — SODIUM CHLORIDE 0.9% FLUSH
10.0000 mL | INTRAVENOUS | Status: DC | PRN
  Administered 2018-12-16: 10 mL
  Filled 2018-12-16: qty 10

## 2018-12-16 MED ORDER — SODIUM CHLORIDE 0.9% FLUSH
10.0000 mL | Freq: Once | INTRAVENOUS | Status: AC
  Administered 2018-12-16: 10 mL
  Filled 2018-12-16: qty 10

## 2018-12-16 MED ORDER — ACETAMINOPHEN 325 MG PO TABS
ORAL_TABLET | ORAL | Status: AC
  Filled 2018-12-16: qty 2

## 2018-12-16 MED ORDER — SODIUM CHLORIDE 0.9 % IV SOLN
400.0000 mg/m2 | Freq: Once | INTRAVENOUS | Status: AC
  Administered 2018-12-16: 760 mg via INTRAVENOUS
  Filled 2018-12-16: qty 19.99

## 2018-12-16 MED ORDER — SODIUM CHLORIDE 0.9 % IV SOLN
Freq: Once | INTRAVENOUS | Status: DC
  Filled 2018-12-16: qty 250

## 2018-12-16 MED ORDER — HEPARIN SOD (PORK) LOCK FLUSH 100 UNIT/ML IV SOLN
500.0000 [IU] | Freq: Once | INTRAVENOUS | Status: AC | PRN
  Administered 2018-12-16: 500 [IU]
  Filled 2018-12-16: qty 5

## 2018-12-16 MED ORDER — DIPHENHYDRAMINE HCL 25 MG PO CAPS
ORAL_CAPSULE | ORAL | Status: AC
  Filled 2018-12-16: qty 2

## 2018-12-16 MED ORDER — DIPHENHYDRAMINE HCL 25 MG PO CAPS
25.0000 mg | ORAL_CAPSULE | Freq: Once | ORAL | Status: AC
  Administered 2018-12-16: 25 mg via ORAL

## 2018-12-16 MED ORDER — PROCHLORPERAZINE MALEATE 10 MG PO TABS
10.0000 mg | ORAL_TABLET | Freq: Once | ORAL | Status: AC
  Administered 2018-12-16: 10 mg via ORAL

## 2018-12-16 MED ORDER — PROCHLORPERAZINE MALEATE 10 MG PO TABS
ORAL_TABLET | ORAL | Status: AC
  Filled 2018-12-16: qty 1

## 2018-12-16 MED ORDER — ACETAMINOPHEN 325 MG PO TABS
650.0000 mg | ORAL_TABLET | Freq: Once | ORAL | Status: AC
  Administered 2018-12-16: 650 mg via ORAL

## 2018-12-16 MED ORDER — SODIUM CHLORIDE 0.9% IV SOLUTION
250.0000 mL | Freq: Once | INTRAVENOUS | Status: AC
  Filled 2018-12-16: qty 250

## 2018-12-16 NOTE — Patient Instructions (Signed)
[facility] Discharge Instructions for Patients Receiving Chemotherapy  Today you received the following chemotherapy agents :  Gemcitabine  To help prevent nausea and vomiting after your treatment, we encourage you to take your nausea medication as prescribed.   If you develop nausea and vomiting that is not controlled by your nausea medication, call the clinic.   BELOW ARE SYMPTOMS THAT SHOULD BE REPORTED IMMEDIATELY:  *FEVER GREATER THAN 100.5 F  *CHILLS WITH OR WITHOUT FEVER  NAUSEA AND VOMITING THAT IS NOT CONTROLLED WITH YOUR NAUSEA MEDICATION  *UNUSUAL SHORTNESS OF BREATH  *UNUSUAL BRUISING OR BLEEDING  TENDERNESS IN MOUTH AND THROAT WITH OR WITHOUT PRESENCE OF ULCERS  *URINARY PROBLEMS  *BOWEL PROBLEMS  UNUSUAL RASH Items with * indicate a potential emergency and should be followed up as soon as possible.  Feel free to call the clinic should you have any questions or concerns. The clinic phone number is [phone].  Please show the [facility] [place] at check-in to the Emergency Department and triage nurse.   Blood Transfusion, Adult, Care After This sheet gives you information about how to care for yourself after your procedure. Your doctor may also give you more specific instructions. If you have problems or questions, contact your doctor. Follow these instructions at home:   Take over-the-counter and prescription medicines only as told by your doctor.  Go back to your normal activities as told by your doctor.  Follow instructions from your doctor about how to take care of the area where an IV tube was put into your vein (insertion site). Make sure you: ? Wash your hands with soap and water before you change your bandage (dressing). If there is no soap and water, use hand sanitizer. ? Change your bandage as told by your doctor.  Check your IV insertion site every day for signs of infection. Check for: ? More redness, swelling, or  pain. ? More fluid or blood. ? Warmth. ? Pus or a bad smell. Contact a doctor if:  You have more redness, swelling, or pain around the IV insertion site.  You have more fluid or blood coming from the IV insertion site.  Your IV insertion site feels warm to the touch.  You have pus or a bad smell coming from the IV insertion site.  Your pee (urine) turns pink, red, or brown.  You feel weak after doing your normal activities. Get help right away if:  You have signs of a serious allergic or body defense (immune) system reaction, including: ? Itchiness. ? Hives. ? Trouble breathing. ? Anxiety. ? Pain in your chest or lower back. ? Fever, flushing, and chills. ? Fast pulse. ? Rash. ? Watery poop (diarrhea). ? Throwing up (vomiting). ? Dark pee. ? Serious headache. ? Dizziness. ? Stiff neck. ? Yellow color in your face or the white parts of your eyes (jaundice). Summary  After a blood transfusion, return to your normal activities as told by your doctor.  Every day, check for signs of infection where the IV tube was put into your vein.  Some signs of infection are warm skin, more redness and pain, more fluid or blood, and pus or a bad smell where the needle went in.  Contact your doctor if you feel weak or have any unusual symptoms. This information is not intended to replace advice given to you by your health care provider. Make sure you discuss any questions you have with your health care provider. Document Released: 10/15/2014 Document  Revised: 05/18/2016 Document Reviewed: 05/18/2016 Elsevier Interactive Patient Education  2019 [employer].  Blood Transfusion, Adult, Care After This sheet gives you information about how to care for yourself after your procedure. Your doctor may also give you more specific instructions. If you have problems or questions, contact your doctor. Follow these instructions at home:   Take over-the-counter and prescription medicines only  as told by your doctor.  Go back to your normal activities as told by your doctor.  Follow instructions from your doctor about how to take care of the area where an IV tube was put into your vein (insertion site). Make sure you: ? Wash your hands with soap and water before you change your bandage (dressing). If there is no soap and water, use hand sanitizer. ? Change your bandage as told by your doctor.  Check your IV insertion site every day for signs of infection. Check for: ? More redness, swelling, or pain. ? More fluid or blood. ? Warmth. ? Pus or a bad smell. Contact a doctor if:  You have more redness, swelling, or pain around the IV insertion site.  You have more fluid or blood coming from the IV insertion site.  Your IV insertion site feels warm to the touch.  You have pus or a bad smell coming from the IV insertion site.  Your pee (urine) turns pink, red, or brown.  You feel weak after doing your normal activities. Get help right away if:  You have signs of a serious allergic or body defense (immune) system reaction, including: ? Itchiness. ? Hives. ? Trouble breathing. ? Anxiety. ? Pain in your chest or lower back. ? Fever, flushing, and chills. ? Fast pulse. ? Rash. ? Watery poop (diarrhea). ? Throwing up (vomiting). ? Dark pee. ? Serious headache. ? Dizziness. ? Stiff neck. ? Yellow color in your face or the white parts of your eyes (jaundice). Summary  After a blood transfusion, return to your normal activities as told by your doctor.  Every day, check for signs of infection where the IV tube was put into your vein.  Some signs of infection are warm skin, more redness and pain, more fluid or blood, and pus or a bad smell where the needle went in.  Contact your doctor if you feel weak or have any unusual symptoms. This information is not intended to replace advice given to you by your health care provider. Make sure you discuss any questions you have  with your health care provider. Document Released: 10/15/2014 Document Revised: 05/18/2016 Document Reviewed: 05/18/2016 Elsevier Interactive Patient Education  [employer].

## 2018-12-17 LAB — BPAM RBC
Blood Product Expiration Date: 202004062359
ISSUE DATE / TIME: 202003101233
Unit Type and Rh: 5100

## 2018-12-17 LAB — TYPE AND SCREEN
ABO/RH(D): O POS
Antibody Screen: NEGATIVE
UNIT DIVISION: 0

## 2018-12-18 ENCOUNTER — Encounter (HOSPITAL_COMMUNITY): Payer: Medicare Other

## 2018-12-19 MED ORDER — ACETAMINOPHEN 325 MG PO TABS
ORAL_TABLET | ORAL | Status: AC
  Filled 2018-12-19: qty 2

## 2018-12-19 MED ORDER — DIPHENHYDRAMINE HCL 25 MG PO CAPS
ORAL_CAPSULE | ORAL | Status: AC
  Filled 2018-12-19: qty 2

## 2018-12-23 ENCOUNTER — Encounter (HOSPITAL_COMMUNITY): Payer: Medicare Other

## 2018-12-23 ENCOUNTER — Inpatient Hospital Stay: Payer: Medicare Other

## 2018-12-23 ENCOUNTER — Other Ambulatory Visit: Payer: Self-pay

## 2018-12-23 DIAGNOSIS — C562 Malignant neoplasm of left ovary: Secondary | ICD-10-CM

## 2018-12-23 DIAGNOSIS — Z5111 Encounter for antineoplastic chemotherapy: Secondary | ICD-10-CM | POA: Diagnosis not present

## 2018-12-23 DIAGNOSIS — D638 Anemia in other chronic diseases classified elsewhere: Secondary | ICD-10-CM

## 2018-12-23 LAB — COMPREHENSIVE METABOLIC PANEL
ALT: 39 U/L (ref 0–44)
ANION GAP: 9 (ref 5–15)
AST: 42 U/L — ABNORMAL HIGH (ref 15–41)
Albumin: 2.9 g/dL — ABNORMAL LOW (ref 3.5–5.0)
Alkaline Phosphatase: 70 U/L (ref 38–126)
BUN: 13 mg/dL (ref 8–23)
CO2: 28 mmol/L (ref 22–32)
Calcium: 8.7 mg/dL — ABNORMAL LOW (ref 8.9–10.3)
Chloride: 97 mmol/L — ABNORMAL LOW (ref 98–111)
Creatinine, Ser: 1.1 mg/dL — ABNORMAL HIGH (ref 0.44–1.00)
GFR calc Af Amer: 54 mL/min — ABNORMAL LOW (ref >60)
GFR calc non Af Amer: 46 mL/min — ABNORMAL LOW (ref >60)
Glucose, Bld: 119 mg/dL — ABNORMAL HIGH (ref 70–99)
Potassium: 4.3 mmol/L (ref 3.5–5.1)
SODIUM: 134 mmol/L — AB (ref 135–145)
Total Bilirubin: 0.3 mg/dL (ref 0.3–1.2)
Total Protein: 6.8 g/dL (ref 6.5–8.1)

## 2018-12-23 LAB — CBC WITH DIFFERENTIAL/PLATELET
Abs Immature Granulocytes: 0.04 10*3/uL (ref 0.00–0.07)
Basophils Absolute: 0 10*3/uL (ref 0.0–0.1)
Basophils Relative: 1 %
EOS ABS: 0 10*3/uL (ref 0.0–0.5)
Eosinophils Relative: 1 %
HEMATOCRIT: 30.7 % — AB (ref 36.0–46.0)
Hemoglobin: 9.6 g/dL — ABNORMAL LOW (ref 12.0–15.0)
Immature Granulocytes: 2 %
Lymphocytes Relative: 23 %
Lymphs Abs: 0.6 10*3/uL — ABNORMAL LOW (ref 0.7–4.0)
MCH: 27.8 pg (ref 26.0–34.0)
MCHC: 31.3 g/dL (ref 30.0–36.0)
MCV: 89 fL (ref 80.0–100.0)
Monocytes Absolute: 0.7 10*3/uL (ref 0.1–1.0)
Monocytes Relative: 27 %
Neutro Abs: 1.3 10*3/uL — ABNORMAL LOW (ref 1.7–7.7)
Neutrophils Relative %: 46 %
Platelets: 183 10*3/uL (ref 150–400)
RBC: 3.45 MIL/uL — ABNORMAL LOW (ref 3.87–5.11)
RDW: 17.5 % — ABNORMAL HIGH (ref 11.5–15.5)
WBC: 2.7 10*3/uL — AB (ref 4.0–10.5)
nRBC: 0 % (ref 0.0–0.2)

## 2018-12-23 LAB — SAMPLE TO BLOOD BANK

## 2018-12-23 MED ORDER — HEPARIN SOD (PORK) LOCK FLUSH 100 UNIT/ML IV SOLN
500.0000 [IU] | Freq: Once | INTRAVENOUS | Status: DC
  Filled 2018-12-23: qty 5

## 2018-12-23 MED ORDER — SODIUM CHLORIDE 0.9% FLUSH
10.0000 mL | Freq: Once | INTRAVENOUS | Status: AC
  Administered 2018-12-23: 10 mL
  Filled 2018-12-23: qty 10

## 2018-12-23 MED ORDER — HEPARIN SOD (PORK) LOCK FLUSH 100 UNIT/ML IV SOLN
500.0000 [IU] | Freq: Once | INTRAVENOUS | Status: AC
  Administered 2018-12-23: 500 [IU]
  Filled 2018-12-23: qty 5

## 2018-12-23 NOTE — Patient Instructions (Signed)

## 2018-12-23 NOTE — Progress Notes (Signed)
Per Dr. Alvy Bimler, okay to hold transfusion of PRBC's due to Hgb and patient is asymptomatic.  Pt aware, voiced understanding.

## 2018-12-25 ENCOUNTER — Encounter (HOSPITAL_COMMUNITY): Payer: Medicare Other

## 2018-12-27 MED ORDER — PEGFILGRASTIM-CBQV 6 MG/0.6ML ~~LOC~~ SOSY
PREFILLED_SYRINGE | SUBCUTANEOUS | Status: AC
  Filled 2018-12-27: qty 0.6

## 2018-12-29 ENCOUNTER — Inpatient Hospital Stay: Payer: Medicare Other

## 2018-12-29 ENCOUNTER — Inpatient Hospital Stay (HOSPITAL_BASED_OUTPATIENT_CLINIC_OR_DEPARTMENT_OTHER): Payer: Medicare Other | Admitting: Hematology and Oncology

## 2018-12-29 ENCOUNTER — Other Ambulatory Visit: Payer: Self-pay

## 2018-12-29 ENCOUNTER — Telehealth: Payer: Self-pay

## 2018-12-29 VITALS — BP 126/54 | HR 105 | Temp 98.1°F | Resp 17 | Ht 64.0 in | Wt 179.6 lb

## 2018-12-29 DIAGNOSIS — C562 Malignant neoplasm of left ovary: Secondary | ICD-10-CM

## 2018-12-29 DIAGNOSIS — Z5111 Encounter for antineoplastic chemotherapy: Secondary | ICD-10-CM

## 2018-12-29 DIAGNOSIS — K439 Ventral hernia without obstruction or gangrene: Secondary | ICD-10-CM

## 2018-12-29 DIAGNOSIS — K1231 Oral mucositis (ulcerative) due to antineoplastic therapy: Secondary | ICD-10-CM

## 2018-12-29 DIAGNOSIS — R188 Other ascites: Secondary | ICD-10-CM

## 2018-12-29 DIAGNOSIS — D638 Anemia in other chronic diseases classified elsewhere: Secondary | ICD-10-CM

## 2018-12-29 DIAGNOSIS — D61818 Other pancytopenia: Secondary | ICD-10-CM

## 2018-12-29 DIAGNOSIS — N390 Urinary tract infection, site not specified: Secondary | ICD-10-CM

## 2018-12-29 DIAGNOSIS — N183 Chronic kidney disease, stage 3 unspecified: Secondary | ICD-10-CM

## 2018-12-29 DIAGNOSIS — I129 Hypertensive chronic kidney disease with stage 1 through stage 4 chronic kidney disease, or unspecified chronic kidney disease: Secondary | ICD-10-CM

## 2018-12-29 DIAGNOSIS — Z79899 Other long term (current) drug therapy: Secondary | ICD-10-CM

## 2018-12-29 DIAGNOSIS — J90 Pleural effusion, not elsewhere classified: Secondary | ICD-10-CM

## 2018-12-29 DIAGNOSIS — R590 Localized enlarged lymph nodes: Secondary | ICD-10-CM

## 2018-12-29 DIAGNOSIS — Z79811 Long term (current) use of aromatase inhibitors: Secondary | ICD-10-CM | POA: Diagnosis not present

## 2018-12-29 DIAGNOSIS — G893 Neoplasm related pain (acute) (chronic): Secondary | ICD-10-CM

## 2018-12-29 DIAGNOSIS — J449 Chronic obstructive pulmonary disease, unspecified: Secondary | ICD-10-CM

## 2018-12-29 DIAGNOSIS — I700 Atherosclerosis of aorta: Secondary | ICD-10-CM

## 2018-12-29 DIAGNOSIS — Z90722 Acquired absence of ovaries, bilateral: Secondary | ICD-10-CM

## 2018-12-29 DIAGNOSIS — Z90710 Acquired absence of both cervix and uterus: Secondary | ICD-10-CM

## 2018-12-29 DIAGNOSIS — I482 Chronic atrial fibrillation, unspecified: Secondary | ICD-10-CM

## 2018-12-29 DIAGNOSIS — Z7901 Long term (current) use of anticoagulants: Secondary | ICD-10-CM

## 2018-12-29 LAB — CBC WITH DIFFERENTIAL/PLATELET
Abs Immature Granulocytes: 0.09 10*3/uL — ABNORMAL HIGH (ref 0.00–0.07)
BASOS PCT: 1 %
Basophils Absolute: 0 10*3/uL (ref 0.0–0.1)
Eosinophils Absolute: 0.1 10*3/uL (ref 0.0–0.5)
Eosinophils Relative: 3 %
HCT: 29.6 % — ABNORMAL LOW (ref 36.0–46.0)
Hemoglobin: 9.1 g/dL — ABNORMAL LOW (ref 12.0–15.0)
Immature Granulocytes: 2 %
Lymphocytes Relative: 32 %
Lymphs Abs: 1.4 10*3/uL (ref 0.7–4.0)
MCH: 26.8 pg (ref 26.0–34.0)
MCHC: 30.7 g/dL (ref 30.0–36.0)
MCV: 87.3 fL (ref 80.0–100.0)
MONOS PCT: 26 %
Monocytes Absolute: 1.2 10*3/uL — ABNORMAL HIGH (ref 0.1–1.0)
Neutro Abs: 1.7 10*3/uL (ref 1.7–7.7)
Neutrophils Relative %: 36 %
Platelets: 219 10*3/uL (ref 150–400)
RBC: 3.39 MIL/uL — ABNORMAL LOW (ref 3.87–5.11)
RDW: 17.7 % — ABNORMAL HIGH (ref 11.5–15.5)
WBC: 4.5 10*3/uL (ref 4.0–10.5)
nRBC: 0 % (ref 0.0–0.2)

## 2018-12-29 LAB — CMP (CANCER CENTER ONLY)
ALT: 21 U/L (ref 0–44)
AST: 26 U/L (ref 15–41)
Albumin: 2.8 g/dL — ABNORMAL LOW (ref 3.5–5.0)
Alkaline Phosphatase: 70 U/L (ref 38–126)
Anion gap: 9 (ref 5–15)
BUN: 15 mg/dL (ref 8–23)
CO2: 27 mmol/L (ref 22–32)
Calcium: 8.6 mg/dL — ABNORMAL LOW (ref 8.9–10.3)
Chloride: 97 mmol/L — ABNORMAL LOW (ref 98–111)
Creatinine: 1.22 mg/dL — ABNORMAL HIGH (ref 0.44–1.00)
GFR, EST AFRICAN AMERICAN: 47 mL/min — AB (ref >60)
GFR, Estimated: 41 mL/min — ABNORMAL LOW (ref >60)
Glucose, Bld: 133 mg/dL — ABNORMAL HIGH (ref 70–99)
Potassium: 4.3 mmol/L (ref 3.5–5.1)
Sodium: 133 mmol/L — ABNORMAL LOW (ref 135–145)
Total Bilirubin: 0.2 mg/dL — ABNORMAL LOW (ref 0.3–1.2)
Total Protein: 6.9 g/dL (ref 6.5–8.1)

## 2018-12-29 LAB — URINALYSIS, COMPLETE (UACMP) WITH MICROSCOPIC
BACTERIA UA: NONE SEEN
Bilirubin Urine: NEGATIVE
Glucose, UA: NEGATIVE mg/dL
Ketones, ur: 5 mg/dL — AB
Nitrite: NEGATIVE
Protein, ur: 30 mg/dL — AB
SPECIFIC GRAVITY, URINE: 1.02 (ref 1.005–1.030)
pH: 6 (ref 5.0–8.0)

## 2018-12-29 LAB — SAMPLE TO BLOOD BANK

## 2018-12-29 MED ORDER — PREDNISONE 20 MG PO TABS: 20.0000 mg | ORAL_TABLET | Freq: Every day | ORAL | 0 refills | Status: DC

## 2018-12-29 MED ORDER — SODIUM CHLORIDE 0.9% FLUSH
10.0000 mL | Freq: Once | INTRAVENOUS | Status: AC
  Administered 2018-12-29: 10 mL
  Filled 2018-12-29: qty 10

## 2018-12-29 NOTE — Telephone Encounter (Signed)
Daughter called and left a message upset that she could not come back with Ms. Tomerlin for appts. She wanted Dr. Alvy Bimler to know that her mother is having increased shortness of breath, nausea this morning, and UTI with fever. She would like a call back after appt with update after visit with Dr. Alvy Bimler.

## 2018-12-30 ENCOUNTER — Telehealth: Payer: Self-pay

## 2018-12-30 ENCOUNTER — Encounter: Payer: Self-pay | Admitting: Hematology and Oncology

## 2018-12-30 ENCOUNTER — Encounter (HOSPITAL_COMMUNITY): Payer: Medicare Other

## 2018-12-30 LAB — URINE CULTURE: Culture: <10000 — AB

## 2018-12-30 NOTE — Telephone Encounter (Signed)
-----   Message from Heath Lark, MD sent at 12/30/2018  9:23 AM EDT ----- Regarding: urine culture Pls tell her tests for UTI is negative Is her breathing better with prednisone?

## 2018-12-30 NOTE — Progress Notes (Signed)
[facility] OFF[facility] PROGRESS NOTE  Patient Care Team: Asencion Noble, MD as PCP - General (Internal Medicine) Satira Sark, MD as PCP - Cardiology (Cardiology) Ahmed Prima, Fransisco Hertz, PA-C as Physician Assistant (Physician Assistant) Heath Lark, MD as Consulting Physician (Hematology and Oncology) Collene Gobble, MD as Consulting Physician (Pulmonary Disease)  ASSESSMENT & PLAN:  Left ovarian epithelial cancer [facility]) She is not feeling well with signs of UTI, low-grade fever and COPD exacerbation We will hold chemotherapy today She does not need transfusion support I will see her next week again for further follow-up  COPD (chronic obstructive pulmonary disease) ([place]) She has diffuse wheezes and signs of COPD exacerbation I recommend low-dose prednisone therapy for week We will hold her treatment.  UTI (urinary tract infection) She thought she have symptoms of UTI Urinalysis and urine culture showed insignificant growth She does not need further antibiotic therapy  CKD (chronic kidney disease) stage 3, GFR 30-59 ml/min (HCC) She has stable chronic kidney disease We will monitor carefully while she is on treatment  Cancer associated pain She will continue taking her pain medicine as prescribed I recommend her to continue on pain medication along with Tylenol as needed   Orders Placed This Encounter  Procedures  . Urine Culture    Standing Status:   Future    Number of Occurrences:   1    Standing Expiration Date:   02/02/2020  . CA 125    Standing Status:   Standing    Number of Occurrences:   9    Standing Expiration Date:   12/29/2019  . Urinalysis, Complete w Microscopic    Standing Status:   Future    Number of Occurrences:   1    Standing Expiration Date:   12/29/2019    INTERVAL HISTORY: Please see below for problem oriented charting. She returns for chemotherapy and follow-up She has not been feeling well She has complaints of urinary frequency,  shortness of breath, cough and low-grade fever of 99 at home. She denies abdominal bloating, changes in bowel habits or nausea She felt weak overall Her pain is stable with current prescription pain medicine  SUMMARY OF ONCOLOGIC HISTORY: Oncology History   High grade serous ER 90%, PR 0% BRCA 1: no loss of expression MMR normal      Left ovarian epithelial cancer ([place])   02/18/2016 Tumor Marker    Patient's tumor was tested for the following markers: CA125 Results of the tumor marker test revealed 45    05/22/2016 Tumor Marker    Patient's tumor was tested for the following markers: CA125 Results of the tumor marker test revealed 53    05/22/2016 Imaging    Outside pelvic US showed 4.1 cm adnexa mass    06/24/2017 Imaging    Ct abdomen and pelvis:  1. Interim finding of moderate ascites within the abdomen and pelvis with additional finding of diffuse nodular infiltration of the omentum and anterior mesenteric fat, the appearance would be consistent with peritoneal carcinomatosis/metastatic disease. Increasing retroperitoneal and upper abdominal adenopathy. 2. Re- demonstrated 3.8 cm cyst in the right adnexa. Enlarging soft tissue density in the left adnexa now with possible cystic component posteriorly. In light of the above findings, concern is for ovarian neoplasm. Correlation with pelvic ultrasound recommended. 3. Small right-sided pleural effusion, new since prior study 4. Stable hypodense splenic lesions since 2017.     06/25/2017 Imaging    US pelvis: 2.9 cm simple appearing cyst in the  right ovary. Left ovary grossly unremarkable. Large volume ascites in the pelvis    06/30/2017 - 07/01/2017 Hospital Admission    She was admitted for evaluation of abdominal pain and ascites    07/01/2017 Pathology Results    PERITONEAL/ASCITIC FLUID(SPECIMEN 1 OF 1 COLLECTED 07/01/17): - POORLY DIFFERENTIATED CARCINOMA; SEE COMMENT Source Peritoneal/Ascitic Fluid, (specimen 1 of 1  collected 07/01/17) Gross Specimen: Received is/are 1000 cc's of brownish fluid. (BS:bs) Prepared: # Smears: 0 # Concentration Technique Slides (i.e. ThinPrep): 1 # Cell Block: 1 Additional Studies: Also received Hematology slide - [id number]. Comment The tumor cells are positive for cytokeratin 7 and Pax-8 but negative for cytokeratin 20, CDX-2, GATA-3, Napsin-A and TTF-1. Based on the immunoprofile a gynecology primary is favored    07/01/2017 Procedure    Successful ultrasound-guided diagnostic and therapeutic paracentesis yielding 2.5 liters of peritoneal fluid    07/07/2017 - 07/09/2017 Hospital Admission    She was admitted for management of malignant ascites    07/08/2017 Procedure    Successful ultrasound-guided therapeutic paracentesis yielding 2.7 liters liters of peritoneal fluid    07/12/2017 Procedure    Successful ultrasound-guided paracentesis yielding 1450 mL of peritoneal fluid    07/18/2017 - 07/24/2017 Hospital Admission    She was admitted for expedited treatment    07/18/2017 Tumor Marker    Patient's tumor was tested for the following markers: CA125 Results of the tumor marker test revealed 1941    07/19/2017 - 02/04/2018 Chemotherapy    The patient had 6 cycles of carboplatin & Taxol for chemotherapy treatment, followed by 3 more cycles of carboplatin only     07/19/2017 - 02/04/2018 Chemotherapy    The patient had carboplatin and taxol    08/06/2017 Procedure    Successful ultrasound-guided therapeutic paracentesis yielding 2.6 liters of peritoneal fluid.    08/09/2017 Tumor Marker    Patient's tumor was tested for the following markers: CA125 Results of the tumor marker test revealed 1665    08/15/2017 Tumor Marker    Patient's tumor was tested for the following markers: CA125 Results of the tumor marker test revealed 937.9    08/20/2017 Imaging    ECHO: Normal LV size with EF 60-65%. Normal RV size and systolic function. No significant valvular  abnormalities.    09/18/2017 Imaging    Chest Impression:  1. No evidence thoracic metastasis. 2. Interval increase and RIGHT pleural effusion.  Abdomen / Pelvis Impression:  1. Interval decrease in intraperitoneal free fluid. 2. Interval decrease in omental nodularity in the LEFT ventral peritoneal space. 3. Interval decrease in nodularity associated with the LEFT ovary. 4. Cystic portion of the RIGHT ovary is increased mildly in size.    09/20/2017 Tumor Marker    Patient's tumor was tested for the following markers: CA125 Results of the tumor marker test revealed 347    10/14/2017 Tumor Marker    Patient's tumor was tested for the following markers: CA125 Results of the tumor marker test revealed 307.4    11/04/2017 Tumor Marker    Patient's tumor was tested for the following markers: CA125 Results of the tumor marker test revealed 262.5    11/28/2017 Imaging    1. Interval decrease in right pleural effusion with resolution of right atelectasis seen previously. 2. New small left pleural effusion, symmetric to the right. 3. No intraperitoneal free fluid on the current study. 4. Continued further decrease in left omental disease, appearing less confluent today than on the prior study. 5. Left ovary remains normal  in appearance today and the right adnexal cystic lesion is decreased in size compared to prior study. 6. 14 mm pancreatic cyst is unchanged. Continued attention on follow-up imaging recommended. 7. Aortic Atherosclerois (ICD10-170.0)    12/13/2017 Tumor Marker    Patient's tumor was tested for the following markers: CA125 Results of the tumor marker test revealed 197.7    01/03/2018 Tumor Marker    Patient's tumor was tested for the following markers: CA125 Results of the tumor marker test revealed 183.1    01/14/2018 Tumor Marker    Patient's tumor was tested for the following markers: CA125 Results of the tumor marker test revealed 177.4    02/04/2018 Tumor Marker     Patient's tumor was tested for the following markers: CA125 Results of the tumor marker test revealed 168.5    02/25/2018 Imaging    1. Omental carcinomatosis appears qualitatively stable to slightly decreased. Stable mild peritoneal thickening in the paracolic gutters. Stable right adnexal cyst. No ascites. No new or progressive metastatic disease in the abdomen or pelvis. 2. Small dependent right pleural effusion is increased. 3. Cystic pancreatic body lesion is decreased and now subcentimeter in size, suggesting a benign lesion. 4. Aortic Atherosclerosis (ICD10-I70.0).    03/03/2018 - 03/07/2018 Hospital Admission    She was hospitalized for GI bleed requiring blood transfusions. Xarelto was placed on hold    03/07/2018 PET scan    1. Persistent hazy omental interstitial nodularity but no hypermetabolism or discrete measurable nodules. No abdominal ascites. 2. No findings for metastatic disease involving the chest. 3. Moderate-sized right pleural effusion and small left pleural effusion.     03/20/2018 Pathology Results    1. Ovary and fallopian tube, right - OVARY AND FALLOPIAN TUBE INVOLVED BY SEROUS CARCINOMA. - PARATUBAL CYST. 2. Uterus +/- tubes/ovaries, neoplastic, cervix, left ovary and fallopian tube - LEFT OVARY: HIGH GRADE SEROUS CARCINOMA WITH TREATMENT EFFECT, SPANNING 2.5 CM. CARCINOMA INVOLVES OVARIAN SURFACE. SEE ONCOLOGY TABLE. - LEFT FALLOPIAN TUBE: INVOLVED BY SEROUS CARCINOMA. - UTERUS: -ENDOMETRIUM: INACTIVE ENDOMETRIUM. NO HYPERPLASIA OR MALIGNANCY. -MYOMETRIUM: UNREMARKABLE. NO MALIGNANCY. -SEROSA: INVOLVED BY SEROUS CARCINOMA. - CERVIX: ENDOCERVICAL POLYP. NO MALIGNANCY. 3. Omentum, resection for tumor - INVOLVED BY SEROUS CARCINOMA. 4. Soft tissue, biopsy, mesenteric nodule - INVOLVED BY SEROUS CARCINOMA. Microscopic Comment 2. OVARY or FALLOPIAN TUBE or PRIMARY PERITONEUM: Procedure: Total hysterectomy and bilateral salpingo-oophorectomy.  Omentectomy. Mesenteric lymph node biopsy. Specimen Integrity: Intact. Tumor Site: Left ovary. Ovarian Surface Involvement (required only if applicable): Present. Fallopian Tube Surface Involvement (required only if applicable): Present, bilateral. Tumor Size: 2.5 cm. Histologic Type: High grade serous carcinoma. Histologic Grade: High grade. Implants (required for advanced stage serous/seromucinous borderline tumors only): N/A. Other Tissue/ Organ Involvement: Bilateral fallopian tubes, right ovary, uterine serosa, omentum. Largest Extrapelvic Peritoneal Focus (required only if applicable): Microscopic, estimated 0.5 cm (omentum). Peritoneal/Ascitic Fluid: Prior Positive ([id number]). Treatment Effect (required only for high-grade serous carcinomas): Present in left ovary. CRS2. Regional Lymph Nodes: No lymph nodes submitted/identified. Pathologic Stage Classification (pTNM, AJCC 8th Edition): ypT3b, ypNX Representative Tumor Block: 1A, 1B, 45F, 35F. Comment(s): The right ovary has only surface deposits with a large paratubal cyst. The left ovary has intraparenchymal tumor with associated treatment effect. Thus the tumor location is classified as a left ovarian primary.    03/20/2018 Surgery    Procedure(s) Performed:  1. Exploratory laparotomy with total hysterectomy and bilateral salpingo-oophorectomy 2. Infragastic Omentectomy  3. Debulking to <1cm gross residual disease   Surgeon: Mart Piggs, MD  Specimens: Uterus Cervix, Bilateral tubes / ovaries and omentum. Mesenteric nodule.  Operative Findings: Debulked to gross residual disease <1cm; however there is miliary disease in multiple locations including the majority of the abdominal peritoneum (anterior abdominal wall, bilateral gutters), diaphragm (Right>left), majority of small bowel mesentary. Normal appendix. Normal small uterus. Right ovary with a cystic lesion ~3cm, some adhesive disease of right adnexa to  rectum/sigmoid. Gross omental disease, which was resected with the omentectomy. Smooth liver surface, but again, diaphragmatic disease noted.       03/20/2018 Genetic Testing    Patient has genetic testing done for ER/PR. Results revealed patient has ER: 90%, PR 0%.     03/31/2018 Tumor Marker    Patient's tumor was tested for the following markers: CA125 Results of the tumor marker test revealed 215.8     Genetic Testing    Patient has genetic testing done for BRCA 1. Results revealed patient has the following: BRCA 1: no loss of expression.     Genetic Testing    Patient has genetic testing done for MMR . Results revealed patient has the following:  MMR: normal    03/31/2018 Genetic Testing    Patient has genetic testing done for BRCA1/2. Results revealed patient has no actionable mutations. She is found to have [place] genetic change of unknown significance    04/21/2018 Imaging    1. No definite findings of residual or recurrent metastatic disease in the abdomen or pelvis status post interval TAHBSO and omentectomy. Stable minimal thickening in the paracolic gutters without discrete peritoneal nodularity. 2. Trace free fluid in the pelvic cul-de-sac. 3. Stable small dependent right pleural effusion. 4. Subcentimeter pancreatic body cystic lesion is stable to slightly decreased. 5. Aortic Atherosclerosis (ICD10-I70.0).    04/21/2018 Tumor Marker    Patient's tumor was tested for the following markers: CA125 Results of the tumor marker test revealed 187.3    04/23/2018 - 09/12/2018 Anti-estrogen oral therapy    She is placed on Femara    05/08/2018 Procedure    Successful ultrasound guided right thoracentesis yielding 800 mL of pleural fluid. Fluid cytology is negative for malignancy     05/28/2018 Tumor Marker    Patient's tumor was tested for the following markers: CA125 Results of the tumor marker test revealed 190    06/30/2018 Tumor Marker    Patient's tumor was tested  for the following markers: CA125 Results of the tumor marker test revealed 148    07/23/2018 Imaging    Status post hysterectomy and bilateral salpingo-oophorectomy.  Very mild peritoneal thickening/nodularity, equivocal but worrisome for very mild peritoneal disease. Attention on follow-up is suggested.  Small right pleural effusion with indwelling pleural drain.    07/23/2018 Tumor Marker    Patient's tumor was tested for the following markers: CA125 Results of the tumor marker test revealed 215    09/11/2018 Tumor Marker    Patient's tumor was tested for the following markers: CA-125. Results of the tumor marker test revealed 1323    09/12/2018 Imaging    1. Mildly progressive ascites with some mild degree of nodularity most appreciable along the left adnexa, right paracolic gutter, right upper quadrant, compatible with peritoneal spread of tumor. 2. Small right pleural effusion with indwelling right pleural catheter again noted. Mild increase in atelectasis anteriorly at the right lung base. 3. Aortic Atherosclerosis (ICD10-I70.0). Coronary atherosclerosis. 4. Several tiny hypodense lesions in the spleen are technically nonspecific, but stable. 5. Air fluid level in the rectum compatible with diarrheal  process. 6. Prominent bilateral hip arthropathy. Left hip screw noted.    09/22/2018 -  Chemotherapy    The patient had carboplatin and gemzar    09/29/2018 Tumor Marker    Patient's tumor was tested for the following markers: CA125 Results of the tumor marker test revealed 1219    09/29/2018 Adverse Reaction    Cycle 1 day 8 of Gemzar was omitted due to severe anemia    10/20/2018 Tumor Marker    Patient's tumor was tested for the following markers: CA125 Results of the tumor marker test revealed 402    11/13/2018 Tumor Marker    Patient's tumor was tested for the following markers: CA125 Results of the tumor marker test revealed 437    11/26/2018 Imaging    1. Mild  residual peritoneal thickening, overall decreased from 09/11/2018, with resolution of previously seen ascites. 2. Tiny residual right pleural effusion with small bore chest tube in place. 3. Supraumbilical midline ventral hernia has a wide neck and contains unobstructed colon. 4.  Aortic atherosclerosis (ICD10-170.0).      REVIEW OF SYSTEMS:   Constitutional: Denies fevers, chills or abnormal weight loss Eyes: Denies blurriness of vision Ears, nose, mouth, throat, and face: Denies mucositis or sore throat Cardiovascular: Denies palpitation, chest discomfort or lower extremity swelling Gastrointestinal:  Denies nausea, heartburn or change in bowel habits Skin: Denies abnormal skin rashes Lymphatics: Denies new lymphadenopathy or easy bruising Behavioral/Psych: Mood is stable, no new changes  All other systems were reviewed with the patient and are negative.  I have reviewed the past medical history, past surgical history, social history and family history with the patient and they are unchanged from previous note.  ALLERGIES:  is allergic to codeine.  MEDICATIONS:  Current Outpatient Medications  Medication Sig Dispense Refill  . sulfamethoxazole-trimethoprim (BACTRIM DS,SEPTRA DS) 800-160 MG tablet Take 1 tablet by mouth 2 (two) times daily.    Marland Kitchen albuterol (PROAIR HFA) 108 (90 Base) MCG/ACT inhaler Inhale 2 puffs into the lungs every 6 (six) hours as needed for wheezing or shortness of breath. 1 Inhaler 3  . ALPRAZolam (XANAX) 0.25 MG tablet Take 1 tablet (0.25 mg total) by mouth at bedtime as needed for anxiety. 5 tablet 0  . amoxicillin (AMOXIL) 500 MG capsule TAKE 4 CAPSULES PO 1 HOUR PRIOR TO DENTAL APPOINTMENT  1  . azelastine (ASTELIN) 0.1 % nasal spray Place 2 sprays into both nostrils 2 (two) times daily as needed for allergies.     Marland Kitchen CARTIA XT 120 MG 24 hr capsule     . Cholecalciferol (VITAMIN D) 2000 units CAPS Take 2,000 Units by mouth daily.     Marland Kitchen dicyclomine (BENTYL)  10 MG capsule Take 1 tab by mouth every morning. May take twice daily as needed. (Patient taking differently: Take 10 mg by mouth 2 (two) times daily. ) 60 capsule 6  . diltiazem (DILACOR XR) 180 MG 24 hr capsule Take 180 mg by mouth every morning.    . diltiazem (TIAZAC) 120 MG 24 hr capsule Take 120 mg by mouth daily. At night time    . diphenhydrAMINE (BENADRYL) 25 mg capsule Take 25 mg by mouth daily as needed for itching.     . diphenoxylate-atropine (LOMOTIL) 2.5-0.025 MG tablet Take 1 tablet by mouth 4 (four) times daily. (Patient taking differently: Take 1 tablet by mouth 4 (four) times daily as needed for diarrhea or loose stools. ) 360 tablet 1  . DULoxetine (CYMBALTA) 60 MG capsule Take 1  capsule (60 mg total) by mouth daily. 30 capsule 3  . fluticasone (FLONASE) 50 MCG/ACT nasal spray Place 2 sprays into both nostrils 2 (two) times daily as needed (FOR NASAL CONGESTION.).     Marland Kitchen furosemide (LASIX) 40 MG tablet Take 1 tablet (40 mg total) by mouth daily. 90 tablet 3  . halobetasol (ULTRAVATE) 0.05 % cream Apply 1 application topically 2 (two) times daily as needed (psoriasis).     Marland Kitchen levothyroxine (SYNTHROID, LEVOTHROID) 137 MCG tablet Take 137 mcg by mouth daily before breakfast. For thyroid therapy    . Magnesium 400 MG CAPS Take 400 mg by mouth daily.    . mirtazapine (REMERON) 15 MG tablet TAKE 1 TABLET(15 MG) BY MOUTH AT BEDTIME (Patient taking differently: Take 15 mg by mouth at bedtime. ) 30 tablet 11  . Oxycodone HCl 10 MG TABS Take 1 tablet (10 mg total) by mouth every 6 (six) hours as needed. 60 tablet 0  . polyvinyl alcohol (LIQUIFILM TEARS) 1.4 % ophthalmic solution Place 1 drop into both eyes 2 (two) times daily as needed for dry eyes.     . potassium chloride SA (K-DUR,KLOR-CON) 20 MEQ tablet TAKE 1 TABLET BY MOUTH DAILY AND TAKE 1 ADDITIONAL TABLET WHEN TAKING EXTRA LASIX DOSE 60 tablet 6  . predniSONE (DELTASONE) 20 MG tablet Take 1 tablet (20 mg total) by mouth daily with  breakfast. 7 tablet 0  . STIOLTO RESPIMAT 2.5-2.5 MCG/ACT AERS INHALE 2 PUFFS INTO THE LUNGS DAILY 4 g 5  . XARELTO 20 MG TABS tablet TAKE 1 TABLET BY MOUTH EVERY DAY AFTER SUPPER 90 tablet 3   No current facility-administered medications for this visit.    Facility-Administered Medications Ordered in Other Visits  Medication Dose Route Frequency Provider Last Rate Last Dose  . 0.9 %  sodium chloride infusion (Manually program via Guardrails IV Fluids)  250 mL Intravenous Once Heath Lark, MD   Stopped at 12/16/18 1500  . 0.9 %  sodium chloride infusion   Intravenous Once Heath Lark, MD   Stopped at 12/16/18 1521  . sodium chloride flush (NS) 0.9 % injection 10 mL  10 mL Intracatheter PRN Alvy Bimler, Blaine Hari, MD   10 mL at 12/16/18 1451    PHYSICAL EXAMINATION: ECOG PERFORMANCE STATUS: 2 - Symptomatic, <50% confined to bed  Vitals:   12/29/18 1001  BP: (!) 126/54  Pulse: (!) 105  Resp: 17  Temp: 98.1 F (36.7 C)  SpO2: 97%   Filed Weights   12/29/18 1001  Weight: 179 lb 9.6 oz (81.5 kg)    GENERAL:alert, no distress and comfortable.  She is ill-appearing SKIN: skin color, texture, turgor are normal, no rashes or significant lesions EYES: normal, Conjunctiva are pink and non-injected, sclera clear OROPHARYNX:no exudate, no erythema and lips, buccal mucosa, and tongue normal  NECK: supple, thyroid normal size, non-tender, without nodularity LYMPH:  no palpable lymphadenopathy in the cervical, axillary or inguinal LUNGS: Diffuse wheezes bilateral lung with increased work of breathing  HEART: regular rate & rhythm and no murmurs and no lower extremity edema ABDOMEN:abdomen soft, non-tender and normal bowel sounds Musculoskeletal:no cyanosis of digits and no clubbing  NEURO: alert & oriented x 3 with fluent speech, no focal motor/sensory deficits  LABORATORY DATA:  I have reviewed the data as listed    Component Value Date/Time   NA 133 (L) 12/29/2018 0900   NA 135 (L) 09/24/2017  0945   K 4.3 12/29/2018 0900   K 3.7 09/24/2017 0945  CL 97 (L) 12/29/2018 0900   CO2 27 12/29/2018 0900   CO2 30 (H) 09/24/2017 0945   GLUCOSE 133 (H) 12/29/2018 0900   GLUCOSE 101 09/24/2017 0945   BUN 15 12/29/2018 0900   BUN 15.3 09/24/2017 0945   CREATININE 1.22 (H) 12/29/2018 0900   CREATININE 0.8 09/24/2017 0945   CALCIUM 8.6 (L) 12/29/2018 0900   CALCIUM 9.1 09/24/2017 0945   PROT 6.9 12/29/2018 0900   PROT 6.6 09/24/2017 0945   ALBUMIN 2.8 (L) 12/29/2018 0900   ALBUMIN 3.3 (L) 09/24/2017 0945   AST 26 12/29/2018 0900   AST 16 09/24/2017 0945   ALT 21 12/29/2018 0900   ALT 12 09/24/2017 0945   ALKPHOS 70 12/29/2018 0900   ALKPHOS 52 09/24/2017 0945   BILITOT 0.2 (L) 12/29/2018 0900   BILITOT 1.13 09/24/2017 0945   GFRNONAA 41 (L) 12/29/2018 0900   GFRAA 47 (L) 12/29/2018 0900    No results found for: SPEP, UPEP  Lab Results  Component Value Date   WBC 4.5 12/29/2018   NEUTROABS 1.7 12/29/2018   HGB 9.1 (L) 12/29/2018   HCT 29.6 (L) 12/29/2018   MCV 87.3 12/29/2018   PLT 219 12/29/2018      Chemistry      Component Value Date/Time   NA 133 (L) 12/29/2018 0900   NA 135 (L) 09/24/2017 0945   K 4.3 12/29/2018 0900   K 3.7 09/24/2017 0945   CL 97 (L) 12/29/2018 0900   CO2 27 12/29/2018 0900   CO2 30 (H) 09/24/2017 0945   BUN 15 12/29/2018 0900   BUN 15.3 09/24/2017 0945   CREATININE 1.22 (H) 12/29/2018 0900   CREATININE 0.8 09/24/2017 0945      Component Value Date/Time   CALCIUM 8.6 (L) 12/29/2018 0900   CALCIUM 9.1 09/24/2017 0945   ALKPHOS 70 12/29/2018 0900   ALKPHOS 52 09/24/2017 0945   AST 26 12/29/2018 0900   AST 16 09/24/2017 0945   ALT 21 12/29/2018 0900   ALT 12 09/24/2017 0945   BILITOT 0.2 (L) 12/29/2018 0900   BILITOT 1.13 09/24/2017 0945       RADIOGRAPHIC STUDIES: I have personally reviewed the radiological images as listed and agreed with the findings in the report. Dg Chest 2 View  Result Date: 12/11/2018 CLINICAL DATA:   Pleural effusion, shortness of breath, cough EXAM: CHEST - 2 VIEW COMPARISON:  10/23/2018 FINDINGS: There is hyperinflation of the lungs compatible with COPD. PleurX catheter in place on the right, stable. No pneumothorax or visible effusions. Heart is normal size. Bibasilar scarring. Right Port-A-Cath is unchanged. IMPRESSION: COPD.  Bibasilar scarring. Right PleurX catheter and Port-A-Cath unchanged. No visible pneumothorax. Electronically Signed   By: Rolm Baptise M.D.   On: 12/11/2018 11:18    All questions were answered. The patient knows to call the clinic with any problems, questions or concerns. No barriers to learning was detected.  I spent 25 minutes counseling the patient face to face. The total time spent in the appointment was 30 minutes and more than 50% was on counseling and review of test results  Heath Lark, MD 12/30/2018 1:15 PM

## 2018-12-30 NOTE — Assessment & Plan Note (Signed)
She is not feeling well with signs of UTI, low-grade fever and COPD exacerbation We will hold chemotherapy today She does not need transfusion support I will see her next week again for further follow-up

## 2018-12-30 NOTE — Assessment & Plan Note (Signed)
She has diffuse wheezes and signs of COPD exacerbation I recommend low-dose prednisone therapy for week We will hold her treatment.

## 2018-12-30 NOTE — Assessment & Plan Note (Signed)
She thought she have symptoms of UTI Urinalysis and urine culture showed insignificant growth She does not need further antibiotic therapy

## 2018-12-30 NOTE — Assessment & Plan Note (Signed)
She has stable chronic kidney disease We will monitor carefully while she is on treatment

## 2018-12-30 NOTE — Telephone Encounter (Signed)
Spoke with dtr by phone and gave results of urinalysis.   Dtr also states pt's breathing is much better. Confirmed appts for 3/30 No other needs at this time

## 2018-12-30 NOTE — Assessment & Plan Note (Signed)
She will continue taking her pain medicine as prescribed I recommend her to continue on pain medication along with Tylenol as needed

## 2019-01-01 ENCOUNTER — Telehealth: Payer: Self-pay | Admitting: Emergency Medicine

## 2019-01-01 ENCOUNTER — Ambulatory Visit: Payer: Self-pay | Admitting: Adult Health

## 2019-01-01 NOTE — Telephone Encounter (Signed)
I placed pt on the schedule for TP to see her at 4:30. I called pt at [phone] to advise but there was no answer. We will try to call her at 4:30 for her televisit. If she calls back just advise her that we will call her at 4:30pm.

## 2019-01-01 NOTE — Telephone Encounter (Signed)
Primary Pulmonologist: Byrum Last office visit and with whom: 07/24/18 with RB What do we see them for (pulmonary problems): COPD/pleural effusion Last OV assessment/plan: Instructions   Agree with continuing to drain your Pleurx catheter every third day.  The frequency may decrease if the amount of fluid is decreasing.  At some point we could even consider having the tube out if there is no more fluid accumulating.  We will discuss with Dr. Alvy Bimler and Dr. Servando Snare going forward.  Continue Stiolto 2 puffs once daily as you have been taking it. Keep albuterol available to use 2 puffs up to every 4 hours if you were to needed for shortness of breath, wheezing, chest tightness. Flu shot up-to-date. Agree with starting pulmonary rehabilitation after you finish physical therapy.  Your pulmonary function testing today is consistent with COPD.  We can provide this data to the pulmonary rehab office.  If you need a new referral let us know and we will make it. Follow with Dr Lamonte Sakai in 6 months or sooner if you have any problems     Was appointment offered to patient (explain)?  Pt wants to know recommendations   Reason for call: Called and spoke with pt's daughter [name] who stated pt has been coughing x1 week but stated her cough has become worse the last couple days. Pt's cough is productive but she does not know what color the phlegm is. Pt did have some SOB a couple days ago but denies any current complaints of chest tightness and also denies any fever.  Pt is currently taking prednisone which was prescribed Monday by oncologist for 7 days.  Pt has not taken any OTC meds.  Pt stated she has not done any recent travel except when she has had to go to [facility] for her chemo treatments and pt has not been around anyone that has been sick.  Pt wants to know recs to help with her symptoms. Tammy, please advise on this for pt. Thanks!

## 2019-01-01 NOTE — Telephone Encounter (Signed)
Please make televisit on my schedule today at 1630.

## 2019-01-01 NOTE — Telephone Encounter (Signed)
I called pt again but there was no answer. I left message for her to call back or schedule a televisit for tomorrow with an APP. Will keep message open until the end of the day.

## 2019-01-02 ENCOUNTER — Ambulatory Visit: Payer: Medicare Other | Admitting: Primary Care

## 2019-01-02 ENCOUNTER — Other Ambulatory Visit: Payer: Self-pay

## 2019-01-02 NOTE — Telephone Encounter (Signed)
Spoke with pt, she states she doesn't want the televisit now because she is feeling better since her oncologist gave her prednisone. Her cough is mostly gone but she will call back if she turns the corner. I advised her that we are open for televisits and thaty she could call us back anytime. Nothing further is needed.

## 2019-01-02 NOTE — Telephone Encounter (Signed)
Pt's daughter [name] is calling back [phone]

## 2019-01-02 NOTE — Telephone Encounter (Signed)
Spoke with pt's daughter, [name]. Pt has been scheduled for a televisit with Beth today at 10:30am. Nothing further was needed.

## 2019-01-05 ENCOUNTER — Other Ambulatory Visit: Payer: Self-pay

## 2019-01-05 ENCOUNTER — Encounter: Payer: Self-pay | Admitting: Hematology and Oncology

## 2019-01-05 ENCOUNTER — Inpatient Hospital Stay (HOSPITAL_BASED_OUTPATIENT_CLINIC_OR_DEPARTMENT_OTHER): Payer: Medicare Other | Admitting: Hematology and Oncology

## 2019-01-05 ENCOUNTER — Inpatient Hospital Stay: Payer: Medicare Other

## 2019-01-05 ENCOUNTER — Telehealth: Payer: Self-pay | Admitting: Physician Assistant

## 2019-01-05 ENCOUNTER — Telehealth: Payer: Self-pay | Admitting: Hematology and Oncology

## 2019-01-05 ENCOUNTER — Ambulatory Visit: Payer: Self-pay

## 2019-01-05 DIAGNOSIS — G893 Neoplasm related pain (acute) (chronic): Secondary | ICD-10-CM

## 2019-01-05 DIAGNOSIS — K439 Ventral hernia without obstruction or gangrene: Secondary | ICD-10-CM

## 2019-01-05 DIAGNOSIS — Z79818 Long term (current) use of other agents affecting estrogen receptors and estrogen levels: Secondary | ICD-10-CM | POA: Diagnosis not present

## 2019-01-05 DIAGNOSIS — R188 Other ascites: Secondary | ICD-10-CM

## 2019-01-05 DIAGNOSIS — Z90710 Acquired absence of both cervix and uterus: Secondary | ICD-10-CM

## 2019-01-05 DIAGNOSIS — N183 Chronic kidney disease, stage 3 (moderate): Secondary | ICD-10-CM

## 2019-01-05 DIAGNOSIS — N390 Urinary tract infection, site not specified: Secondary | ICD-10-CM

## 2019-01-05 DIAGNOSIS — Z5111 Encounter for antineoplastic chemotherapy: Secondary | ICD-10-CM | POA: Diagnosis not present

## 2019-01-05 DIAGNOSIS — I482 Chronic atrial fibrillation, unspecified: Secondary | ICD-10-CM

## 2019-01-05 DIAGNOSIS — J449 Chronic obstructive pulmonary disease, unspecified: Secondary | ICD-10-CM

## 2019-01-05 DIAGNOSIS — R590 Localized enlarged lymph nodes: Secondary | ICD-10-CM

## 2019-01-05 DIAGNOSIS — I129 Hypertensive chronic kidney disease with stage 1 through stage 4 chronic kidney disease, or unspecified chronic kidney disease: Secondary | ICD-10-CM

## 2019-01-05 DIAGNOSIS — Z90722 Acquired absence of ovaries, bilateral: Secondary | ICD-10-CM

## 2019-01-05 DIAGNOSIS — C562 Malignant neoplasm of left ovary: Secondary | ICD-10-CM

## 2019-01-05 DIAGNOSIS — I700 Atherosclerosis of aorta: Secondary | ICD-10-CM

## 2019-01-05 DIAGNOSIS — D638 Anemia in other chronic diseases classified elsewhere: Secondary | ICD-10-CM

## 2019-01-05 DIAGNOSIS — J90 Pleural effusion, not elsewhere classified: Secondary | ICD-10-CM

## 2019-01-05 DIAGNOSIS — Z7901 Long term (current) use of anticoagulants: Secondary | ICD-10-CM

## 2019-01-05 DIAGNOSIS — Z79899 Other long term (current) drug therapy: Secondary | ICD-10-CM

## 2019-01-05 DIAGNOSIS — K1231 Oral mucositis (ulcerative) due to antineoplastic therapy: Secondary | ICD-10-CM

## 2019-01-05 LAB — CBC WITH DIFFERENTIAL/PLATELET
Abs Immature Granulocytes: 1.04 10*3/uL — ABNORMAL HIGH (ref 0.00–0.07)
Basophils Absolute: 0.1 10*3/uL (ref 0.0–0.1)
Basophils Relative: 1 %
EOS PCT: 1 %
Eosinophils Absolute: 0.1 10*3/uL (ref 0.0–0.5)
HCT: 34 % — ABNORMAL LOW (ref 36.0–46.0)
Hemoglobin: 10.6 g/dL — ABNORMAL LOW (ref 12.0–15.0)
Immature Granulocytes: 6 %
Lymphocytes Relative: 15 %
Lymphs Abs: 2.5 10*3/uL (ref 0.7–4.0)
MCH: 26.9 pg (ref 26.0–34.0)
MCHC: 31.2 g/dL (ref 30.0–36.0)
MCV: 86.3 fL (ref 80.0–100.0)
Monocytes Absolute: 1.9 10*3/uL — ABNORMAL HIGH (ref 0.1–1.0)
Monocytes Relative: 12 %
NRBC: 0 % (ref 0.0–0.2)
Neutro Abs: 10.8 10*3/uL — ABNORMAL HIGH (ref 1.7–7.7)
Neutrophils Relative %: 65 %
Platelets: 385 10*3/uL (ref 150–400)
RBC: 3.94 MIL/uL (ref 3.87–5.11)
RDW: 17.2 % — ABNORMAL HIGH (ref 11.5–15.5)
WBC: 16.4 10*3/uL — ABNORMAL HIGH (ref 4.0–10.5)

## 2019-01-05 LAB — COMPREHENSIVE METABOLIC PANEL
ALBUMIN: 3.5 g/dL (ref 3.5–5.0)
ALT: 20 U/L (ref 0–44)
ANION GAP: 9 (ref 5–15)
AST: 18 U/L (ref 15–41)
Alkaline Phosphatase: 75 U/L (ref 38–126)
BUN: 27 mg/dL — AB (ref 8–23)
CO2: 28 mmol/L (ref 22–32)
Calcium: 8.9 mg/dL (ref 8.9–10.3)
Chloride: 99 mmol/L (ref 98–111)
Creatinine, Ser: 1.06 mg/dL — ABNORMAL HIGH (ref 0.44–1.00)
GFR calc Af Amer: 56 mL/min — ABNORMAL LOW (ref >60)
GFR calc non Af Amer: 49 mL/min — ABNORMAL LOW (ref >60)
Glucose, Bld: 86 mg/dL (ref 70–99)
POTASSIUM: 3.6 mmol/L (ref 3.5–5.1)
Sodium: 136 mmol/L (ref 135–145)
Total Bilirubin: 0.3 mg/dL (ref 0.3–1.2)
Total Protein: 7.8 g/dL (ref 6.5–8.1)

## 2019-01-05 LAB — SAMPLE TO BLOOD BANK

## 2019-01-05 MED ORDER — SODIUM CHLORIDE 0.9 % IV SOLN
149.4000 mg | Freq: Once | INTRAVENOUS | Status: AC
  Administered 2019-01-05: 150 mg via INTRAVENOUS
  Filled 2019-01-05: qty 15

## 2019-01-05 MED ORDER — SODIUM CHLORIDE 0.9 % IV SOLN
Freq: Once | INTRAVENOUS | Status: AC
  Administered 2019-01-05: 13:00:00 via INTRAVENOUS
  Filled 2019-01-05: qty 5

## 2019-01-05 MED ORDER — FAMOTIDINE IN NACL 20-0.9 MG/50ML-% IV SOLN: 20.0000 mg | Freq: Once | INTRAVENOUS | Status: DC

## 2019-01-05 MED ORDER — DIPHENHYDRAMINE HCL 25 MG PO TABS
25.0000 mg | ORAL_TABLET | Freq: Once | ORAL | Status: AC
  Administered 2019-01-05: 25 mg via ORAL
  Filled 2019-01-05: qty 1

## 2019-01-05 MED ORDER — PALONOSETRON HCL INJECTION 0.25 MG/5ML
INTRAVENOUS | Status: AC
  Filled 2019-01-05: qty 5

## 2019-01-05 MED ORDER — DICYCLOMINE HCL 10 MG PO CAPS: 10.0000 mg | ORAL_CAPSULE | Freq: Two times a day (BID) | ORAL | 1 refills | Status: DC

## 2019-01-05 MED ORDER — SODIUM CHLORIDE 0.9 % IV SOLN
400.0000 mg/m2 | Freq: Once | INTRAVENOUS | Status: AC
  Administered 2019-01-05: 760 mg via INTRAVENOUS
  Filled 2019-01-05: qty 19.99

## 2019-01-05 MED ORDER — PALONOSETRON HCL INJECTION 0.25 MG/5ML
0.2500 mg | Freq: Once | INTRAVENOUS | Status: AC
  Administered 2019-01-05: 0.25 mg via INTRAVENOUS

## 2019-01-05 MED ORDER — SODIUM CHLORIDE 0.9 % IV SOLN
20.0000 mg | Freq: Once | INTRAVENOUS | Status: AC
  Administered 2019-01-05: 20 mg via INTRAVENOUS
  Filled 2019-01-05: qty 2

## 2019-01-05 MED ORDER — SODIUM CHLORIDE 0.9% FLUSH
10.0000 mL | INTRAVENOUS | Status: DC | PRN
  Administered 2019-01-05: 10 mL
  Filled 2019-01-05: qty 10

## 2019-01-05 MED ORDER — DIPHENHYDRAMINE HCL 25 MG PO CAPS
ORAL_CAPSULE | ORAL | Status: AC
  Filled 2019-01-05: qty 1

## 2019-01-05 MED ORDER — SODIUM CHLORIDE 0.9 % IV SOLN
Freq: Once | INTRAVENOUS | Status: AC
  Administered 2019-01-05: 12:00:00 via INTRAVENOUS
  Filled 2019-01-05: qty 250

## 2019-01-05 MED ORDER — HEPARIN SOD (PORK) LOCK FLUSH 100 UNIT/ML IV SOLN
500.0000 [IU] | Freq: Once | INTRAVENOUS | Status: AC | PRN
  Administered 2019-01-05: 500 [IU]
  Filled 2019-01-05: qty 5

## 2019-01-05 NOTE — Progress Notes (Signed)
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  She will continue close monitoring.   No orders of the defined types were placed in this encounter.   INTERVAL HISTORY: Please see below for problem oriented charting. She returns for chemotherapy and follow-up Since last time I saw her, her symptoms has improved dramatically Her cough and shortness of breath is resolved No further wheezes She had recent pleural fluid drained but not much She denies abdominal bloating, nausea or vomiting The patient denies any recent signs or symptoms of bleeding such as spontaneous epistaxis, hematuria or hematochezia. Denies peripheral neuropathy from treatment  SUMMARY OF ONCOLOGIC HISTORY: Oncology History   High grade serous ER 90%, PR 0% BRCA 1: no loss of expression MMR normal      Left ovarian epithelial cancer ([place])   02/18/2016 Tumor Marker    Patient's tumor was tested for the following markers: CA125 Results of the tumor marker test revealed 45    05/22/2016 Tumor Marker    Patient's tumor was tested for the following markers:  CA125 Results of the tumor marker test revealed 53    05/22/2016 Imaging    Outside pelvic US showed 4.1 cm adnexa mass    06/24/2017 Imaging    Ct abdomen and pelvis:  1. Interim finding of moderate ascites within the abdomen and pelvis with additional finding of diffuse nodular infiltration of the omentum and anterior mesenteric fat, the appearance would be consistent with peritoneal carcinomatosis/metastatic disease. Increasing retroperitoneal and upper abdominal adenopathy. 2. Re- demonstrated 3.8 cm cyst in the right adnexa. Enlarging soft tissue density in the left adnexa now with possible cystic component posteriorly. In light of the above findings, concern is for ovarian neoplasm. Correlation with pelvic ultrasound recommended. 3. Small right-sided pleural effusion, new since prior study 4. Stable hypodense splenic lesions since 2017.     06/25/2017 Imaging    US pelvis: 2.9 cm simple appearing cyst in the right ovary. Left ovary grossly unremarkable. Large volume ascites in the pelvis    06/30/2017 - 07/01/2017 Hospital Admission    She was admitted for evaluation of abdominal pain and ascites    07/01/2017 Pathology Results    PERITONEAL/ASCITIC FLUID(SPECIMEN 1 OF 1 COLLECTED 07/01/17): - POORLY DIFFERENTIATED CARCINOMA; SEE COMMENT Source Peritoneal/Ascitic Fluid, (specimen 1 of 1 collected 07/01/17) Gross Specimen: Received is/are 1000 cc's of brownish fluid. (BS:bs) Prepared: # Smears: 0 # Concentration Technique Slides (i.e. ThinPrep): 1 # Cell Block: 1 Additional Studies: Also received Hematology slide - [id number]. Comment The tumor cells are positive for cytokeratin 7 and Pax-8 but negative for cytokeratin 20, CDX-2, GATA-3, Napsin-A and TTF-1. Based on the immunoprofile a gynecology primary is favored    07/01/2017 Procedure  Successful ultrasound-guided diagnostic and therapeutic paracentesis yielding 2.5 liters of peritoneal fluid    07/07/2017 - 07/09/2017 Hospital  Admission    She was admitted for management of malignant ascites    07/08/2017 Procedure    Successful ultrasound-guided therapeutic paracentesis yielding 2.7 liters liters of peritoneal fluid    07/12/2017 Procedure    Successful ultrasound-guided paracentesis yielding 1450 mL of peritoneal fluid    07/18/2017 - 07/24/2017 Hospital Admission    She was admitted for expedited treatment    07/18/2017 Tumor Marker    Patient's tumor was tested for the following markers: CA125 Results of the tumor marker test revealed 1941    07/19/2017 - 02/04/2018 Chemotherapy    The patient had 6 cycles of carboplatin & Taxol for chemotherapy treatment, followed by 3 more cycles of carboplatin only     07/19/2017 - 02/04/2018 Chemotherapy    The patient had carboplatin and taxol    08/06/2017 Procedure    Successful ultrasound-guided therapeutic paracentesis yielding 2.6 liters of peritoneal fluid.    08/09/2017 Tumor Marker    Patient's tumor was tested for the following markers: CA125 Results of the tumor marker test revealed 1665    08/15/2017 Tumor Marker    Patient's tumor was tested for the following markers: CA125 Results of the tumor marker test revealed 937.9    08/20/2017 Imaging    ECHO: Normal LV size with EF 60-65%. Normal RV size and systolic function. No significant valvular abnormalities.    09/18/2017 Imaging    Chest Impression:  1. No evidence thoracic metastasis. 2. Interval increase and RIGHT pleural effusion.  Abdomen / Pelvis Impression:  1. Interval decrease in intraperitoneal free fluid. 2. Interval decrease in omental nodularity in the LEFT ventral peritoneal space. 3. Interval decrease in nodularity associated with the LEFT ovary. 4. Cystic portion of the RIGHT ovary is increased mildly in size.    09/20/2017 Tumor Marker    Patient's tumor was tested for the following markers: CA125 Results of the tumor marker test revealed 347    10/14/2017 Tumor Marker     Patient's tumor was tested for the following markers: CA125 Results of the tumor marker test revealed 307.4    11/04/2017 Tumor Marker    Patient's tumor was tested for the following markers: CA125 Results of the tumor marker test revealed 262.5    11/28/2017 Imaging    1. Interval decrease in right pleural effusion with resolution of right atelectasis seen previously. 2. New small left pleural effusion, symmetric to the right. 3. No intraperitoneal free fluid on the current study. 4. Continued further decrease in left omental disease, appearing less confluent today than on the prior study. 5. Left ovary remains normal in appearance today and the right adnexal cystic lesion is decreased in size compared to prior study. 6. 14 mm pancreatic cyst is unchanged. Continued attention on follow-up imaging recommended. 7. Aortic Atherosclerois (ICD10-170.0)    12/13/2017 Tumor Marker    Patient's tumor was tested for the following markers: CA125 Results of the tumor marker test revealed 197.7    01/03/2018 Tumor Marker    Patient's tumor was tested for the following markers: CA125 Results of the tumor marker test revealed 183.1    01/14/2018 Tumor Marker    Patient's tumor was tested for the following markers: CA125 Results of the tumor marker test revealed 177.4    02/04/2018 Tumor Marker    Patient's tumor was tested for the following markers: CA125 Results of the tumor marker  test revealed 168.5    02/25/2018 Imaging    1. Omental carcinomatosis appears qualitatively stable to slightly decreased. Stable mild peritoneal thickening in the paracolic gutters. Stable right adnexal cyst. No ascites. No new or progressive metastatic disease in the abdomen or pelvis. 2. Small dependent right pleural effusion is increased. 3. Cystic pancreatic body lesion is decreased and now subcentimeter in size, suggesting a benign lesion. 4. Aortic Atherosclerosis (ICD10-I70.0).    03/03/2018 - 03/07/2018 Hospital  Admission    She was hospitalized for GI bleed requiring blood transfusions. Xarelto was placed on hold    03/07/2018 PET scan    1. Persistent hazy omental interstitial nodularity but no hypermetabolism or discrete measurable nodules. No abdominal ascites. 2. No findings for metastatic disease involving the chest. 3. Moderate-sized right pleural effusion and small left pleural effusion.     03/20/2018 Pathology Results    1. Ovary and fallopian tube, right - OVARY AND FALLOPIAN TUBE INVOLVED BY SEROUS CARCINOMA. - PARATUBAL CYST. 2. Uterus +/- tubes/ovaries, neoplastic, cervix, left ovary and fallopian tube - LEFT OVARY: HIGH GRADE SEROUS CARCINOMA WITH TREATMENT EFFECT, SPANNING 2.5 CM. CARCINOMA INVOLVES OVARIAN SURFACE. SEE ONCOLOGY TABLE. - LEFT FALLOPIAN TUBE: INVOLVED BY SEROUS CARCINOMA. - UTERUS: -ENDOMETRIUM: INACTIVE ENDOMETRIUM. NO HYPERPLASIA OR MALIGNANCY. -MYOMETRIUM: UNREMARKABLE. NO MALIGNANCY. -SEROSA: INVOLVED BY SEROUS CARCINOMA. - CERVIX: ENDOCERVICAL POLYP. NO MALIGNANCY. 3. Omentum, resection for tumor - INVOLVED BY SEROUS CARCINOMA. 4. Soft tissue, biopsy, mesenteric nodule - INVOLVED BY SEROUS CARCINOMA. Microscopic Comment 2. OVARY or FALLOPIAN TUBE or PRIMARY PERITONEUM: Procedure: Total hysterectomy and bilateral salpingo-oophorectomy. Omentectomy. Mesenteric lymph node biopsy. Specimen Integrity: Intact. Tumor Site: Left ovary. Ovarian Surface Involvement (required only if applicable): Present. Fallopian Tube Surface Involvement (required only if applicable): Present, bilateral. Tumor Size: 2.5 cm. Histologic Type: High grade serous carcinoma. Histologic Grade: High grade. Implants (required for advanced stage serous/seromucinous borderline tumors only): N/A. Other Tissue/ Organ Involvement: Bilateral fallopian tubes, right ovary, uterine serosa, omentum. Largest Extrapelvic Peritoneal Focus (required only if applicable): Microscopic, estimated 0.5  cm (omentum). Peritoneal/Ascitic Fluid: Prior Positive ([id number]). Treatment Effect (required only for high-grade serous carcinomas): Present in left ovary. CRS2. Regional Lymph Nodes: No lymph nodes submitted/identified. Pathologic Stage Classification (pTNM, AJCC 8th Edition): ypT3b, ypNX Representative Tumor Block: 1A, 1B, 61F, 89F. Comment(s): The right ovary has only surface deposits with a large paratubal cyst. The left ovary has intraparenchymal tumor with associated treatment effect. Thus the tumor location is classified as a left ovarian primary.    03/20/2018 Surgery    Procedure(s) Performed:  1. Exploratory laparotomy with total hysterectomy and bilateral salpingo-oophorectomy 2. Infragastic Omentectomy  3. Debulking to <1cm gross residual disease   Surgeon: Mart Piggs, MD  Specimens: Uterus Cervix, Bilateral tubes / ovaries and omentum. Mesenteric nodule.  Operative Findings: Debulked to gross residual disease <1cm; however there is miliary disease in multiple locations including the majority of the abdominal peritoneum (anterior abdominal wall, bilateral gutters), diaphragm (Right>left), majority of small bowel mesentary. Normal appendix. Normal small uterus. Right ovary with a cystic lesion ~3cm, some adhesive disease of right adnexa to rectum/sigmoid. Gross omental disease, which was resected with the omentectomy. Smooth liver surface, but again, diaphragmatic disease noted.       03/20/2018 Genetic Testing    Patient has genetic testing done for ER/PR. Results revealed patient has ER: 90%, PR 0%.     03/31/2018 Tumor Marker    Patient's tumor was tested for the following markers: CA125 Results of the tumor  marker test revealed 215.8     Genetic Testing    Patient has genetic testing done for BRCA 1. Results revealed patient has the following: BRCA 1: no loss of expression.     Genetic Testing    Patient has genetic testing done for MMR . Results  revealed patient has the following:  MMR: normal    03/31/2018 Genetic Testing    Patient has genetic testing done for BRCA1/2. Results revealed patient has no actionable mutations. She is found to have [place] genetic change of unknown significance    04/21/2018 Imaging    1. No definite findings of residual or recurrent metastatic disease in the abdomen or pelvis status post interval TAHBSO and omentectomy. Stable minimal thickening in the paracolic gutters without discrete peritoneal nodularity. 2. Trace free fluid in the pelvic cul-de-sac. 3. Stable small dependent right pleural effusion. 4. Subcentimeter pancreatic body cystic lesion is stable to slightly decreased. 5. Aortic Atherosclerosis (ICD10-I70.0).    04/21/2018 Tumor Marker    Patient's tumor was tested for the following markers: CA125 Results of the tumor marker test revealed 187.3    04/23/2018 - 09/12/2018 Anti-estrogen oral therapy    She is placed on Femara    05/08/2018 Procedure    Successful ultrasound guided right thoracentesis yielding 800 mL of pleural fluid. Fluid cytology is negative for malignancy     05/28/2018 Tumor Marker    Patient's tumor was tested for the following markers: CA125 Results of the tumor marker test revealed 190    06/30/2018 Tumor Marker    Patient's tumor was tested for the following markers: CA125 Results of the tumor marker test revealed 148    07/23/2018 Imaging    Status post hysterectomy and bilateral salpingo-oophorectomy.  Very mild peritoneal thickening/nodularity, equivocal but worrisome for very mild peritoneal disease. Attention on follow-up is suggested.  Small right pleural effusion with indwelling pleural drain.    07/23/2018 Tumor Marker    Patient's tumor was tested for the following markers: CA125 Results of the tumor marker test revealed 215    09/11/2018 Tumor Marker    Patient's tumor was tested for the following markers: CA-125. Results of the tumor marker test  revealed 1323    09/12/2018 Imaging    1. Mildly progressive ascites with some mild degree of nodularity most appreciable along the left adnexa, right paracolic gutter, right upper quadrant, compatible with peritoneal spread of tumor. 2. Small right pleural effusion with indwelling right pleural catheter again noted. Mild increase in atelectasis anteriorly at the right lung base. 3. Aortic Atherosclerosis (ICD10-I70.0). Coronary atherosclerosis. 4. Several tiny hypodense lesions in the spleen are technically nonspecific, but stable. 5. Air fluid level in the rectum compatible with diarrheal process. 6. Prominent bilateral hip arthropathy. Left hip screw noted.    09/22/2018 -  Chemotherapy    The patient had carboplatin and gemzar    09/29/2018 Tumor Marker    Patient's tumor was tested for the following markers: CA125 Results of the tumor marker test revealed 1219    09/29/2018 Adverse Reaction    Cycle 1 day 8 of Gemzar was omitted due to severe anemia    10/20/2018 Tumor Marker    Patient's tumor was tested for the following markers: CA125 Results of the tumor marker test revealed 402    11/13/2018 Tumor Marker    Patient's tumor was tested for the following markers: CA125 Results of the tumor marker test revealed 437    11/26/2018 Imaging    1.  Mild residual peritoneal thickening, overall decreased from 09/11/2018, with resolution of previously seen ascites. 2. Tiny residual right pleural effusion with small bore chest tube in place. 3. Supraumbilical midline ventral hernia has a wide neck and contains unobstructed colon. 4.  Aortic atherosclerosis (ICD10-170.0).      REVIEW OF SYSTEMS:   Constitutional: Denies fevers, chills or abnormal weight loss Eyes: Denies blurriness of vision Ears, nose, mouth, throat, and face: Denies mucositis or sore throat Cardiovascular: Denies palpitation, chest discomfort or lower extremity swelling Gastrointestinal:  Denies nausea,  heartburn or change in bowel habits Skin: Denies abnormal skin rashes Lymphatics: Denies new lymphadenopathy or easy bruising Neurological:Denies numbness, tingling or new weaknesses Behavioral/Psych: Mood is stable, no new changes  All other systems were reviewed with the patient and are negative.  I have reviewed the past medical history, past surgical history, social history and family history with the patient and they are unchanged from previous note.  ALLERGIES:  is allergic to codeine.  MEDICATIONS:  Current Outpatient Medications  Medication Sig Dispense Refill  . albuterol (PROAIR HFA) 108 (90 Base) MCG/ACT inhaler Inhale 2 puffs into the lungs every 6 (six) hours as needed for wheezing or shortness of breath. 1 Inhaler 3  . ALPRAZolam (XANAX) 0.25 MG tablet Take 1 tablet (0.25 mg total) by mouth at bedtime as needed for anxiety. 5 tablet 0  . amoxicillin (AMOXIL) 500 MG capsule TAKE 4 CAPSULES PO 1 HOUR PRIOR TO DENTAL APPOINTMENT  1  . azelastine (ASTELIN) 0.1 % nasal spray Place 2 sprays into both nostrils 2 (two) times daily as needed for allergies.     Marland Kitchen CARTIA XT 120 MG 24 hr capsule     . Cholecalciferol (VITAMIN D) 2000 units CAPS Take 2,000 Units by mouth daily.     Marland Kitchen dicyclomine (BENTYL) 10 MG capsule Take 1 tab by mouth every morning. May take twice daily as needed. (Patient taking differently: Take 10 mg by mouth 2 (two) times daily. ) 60 capsule 6  . diltiazem (DILACOR XR) 180 MG 24 hr capsule Take 180 mg by mouth every morning.    . diltiazem (TIAZAC) 120 MG 24 hr capsule Take 120 mg by mouth daily. At night time    . diphenhydrAMINE (BENADRYL) 25 mg capsule Take 25 mg by mouth daily as needed for itching.     . diphenoxylate-atropine (LOMOTIL) 2.5-0.025 MG tablet Take 1 tablet by mouth 4 (four) times daily. (Patient taking differently: Take 1 tablet by mouth 4 (four) times daily as needed for diarrhea or loose stools. ) 360 tablet 1  . DULoxetine (CYMBALTA) 60 MG  capsule Take 1 capsule (60 mg total) by mouth daily. 30 capsule 3  . fluticasone (FLONASE) 50 MCG/ACT nasal spray Place 2 sprays into both nostrils 2 (two) times daily as needed (FOR NASAL CONGESTION.).     Marland Kitchen furosemide (LASIX) 40 MG tablet Take 1 tablet (40 mg total) by mouth daily. 90 tablet 3  . halobetasol (ULTRAVATE) 0.05 % cream Apply 1 application topically 2 (two) times daily as needed (psoriasis).     Marland Kitchen levothyroxine (SYNTHROID, LEVOTHROID) 137 MCG tablet Take 137 mcg by mouth daily before breakfast. For thyroid therapy    . Magnesium 400 MG CAPS Take 400 mg by mouth daily.    . mirtazapine (REMERON) 15 MG tablet TAKE 1 TABLET(15 MG) BY MOUTH AT BEDTIME (Patient taking differently: Take 15 mg by mouth at bedtime. ) 30 tablet 11  . Oxycodone HCl 10 MG TABS Take  1 tablet (10 mg total) by mouth every 6 (six) hours as needed. 60 tablet 0  . polyvinyl alcohol (LIQUIFILM TEARS) 1.4 % ophthalmic solution Place 1 drop into both eyes 2 (two) times daily as needed for dry eyes.     . potassium chloride SA (K-DUR,KLOR-CON) 20 MEQ tablet TAKE 1 TABLET BY MOUTH DAILY AND TAKE 1 ADDITIONAL TABLET WHEN TAKING EXTRA LASIX DOSE 60 tablet 6  . STIOLTO RESPIMAT 2.5-2.5 MCG/ACT AERS INHALE 2 PUFFS INTO THE LUNGS DAILY 4 g 5  . XARELTO 20 MG TABS tablet TAKE 1 TABLET BY MOUTH EVERY DAY AFTER SUPPER 90 tablet 3   No current facility-administered medications for this visit.    Facility-Administered Medications Ordered in Other Visits  Medication Dose Route Frequency Provider Last Rate Last Dose  . 0.9 %  sodium chloride infusion (Manually program via Guardrails IV Fluids)  250 mL Intravenous Once Heath Lark, MD   Stopped at 12/16/18 1500  . 0.9 %  sodium chloride infusion   Intravenous Once Heath Lark, MD   Stopped at 12/16/18 1521  . CARBOplatin (PARAPLATIN) 150 mg in sodium chloride 0.9 % 250 mL chemo infusion  150 mg Intravenous Once Andriea Hasegawa, MD      . heparin lock flush 100 unit/mL  500 Units  Intracatheter Once PRN Alvy Bimler, Tzirel Leonor, MD      . sodium chloride flush (NS) 0.9 % injection 10 mL  10 mL Intracatheter PRN Alvy Bimler, Kingson Lohmeyer, MD   10 mL at 12/16/18 1451  . sodium chloride flush (NS) 0.9 % injection 10 mL  10 mL Intracatheter PRN Alvy Bimler, Ameliya Nicotra, MD        PHYSICAL EXAMINATION: ECOG PERFORMANCE STATUS: 2 - Symptomatic, <50% confined to bed  Vitals:   01/05/19 1134  BP: (!) 132/52  Pulse: 90  Resp: 18  Temp: 98.5 F (36.9 C)  SpO2: 98%   There were no vitals filed for this visit.  GENERAL:alert, no distress and comfortable SKIN: skin color, texture, turgor are normal, no rashes or significant lesions EYES: normal, Conjunctiva are pink and non-injected, sclera clear OROPHARYNX:no exudate, no erythema and lips, buccal mucosa, and tongue normal  NECK: supple, thyroid normal size, non-tender, without nodularity LYMPH:  no palpable lymphadenopathy in the cervical, axillary or inguinal LUNGS: clear to auscultation and percussion with normal breathing effort HEART: regular rate & rhythm and no murmurs and no lower extremity edema ABDOMEN:abdomen soft, non-tender and normal bowel sounds Musculoskeletal:no cyanosis of digits and no clubbing  NEURO: alert & oriented x 3 with fluent speech, no focal motor/sensory deficits  LABORATORY DATA:  I have reviewed the data as listed    Component Value Date/Time   NA 136 01/05/2019 1018   NA 135 (L) 09/24/2017 0945   K 3.6 01/05/2019 1018   K 3.7 09/24/2017 0945   CL 99 01/05/2019 1018   CO2 28 01/05/2019 1018   CO2 30 (H) 09/24/2017 0945   GLUCOSE 86 01/05/2019 1018   GLUCOSE 101 09/24/2017 0945   BUN 27 (H) 01/05/2019 1018   BUN 15.3 09/24/2017 0945   CREATININE 1.06 (H) 01/05/2019 1018   CREATININE 1.22 (H) 12/29/2018 0900   CREATININE 0.8 09/24/2017 0945   CALCIUM 8.9 01/05/2019 1018   CALCIUM 9.1 09/24/2017 0945   PROT 7.8 01/05/2019 1018   PROT 6.6 09/24/2017 0945   ALBUMIN 3.5 01/05/2019 1018   ALBUMIN 3.3 (L) 09/24/2017  0945   AST 18 01/05/2019 1018   AST 26 12/29/2018 0900   AST  16 09/24/2017 0945   ALT 20 01/05/2019 1018   ALT 21 12/29/2018 0900   ALT 12 09/24/2017 0945   ALKPHOS 75 01/05/2019 1018   ALKPHOS 52 09/24/2017 0945   BILITOT 0.3 01/05/2019 1018   BILITOT 0.2 (L) 12/29/2018 0900   BILITOT 1.13 09/24/2017 0945   GFRNONAA 49 (L) 01/05/2019 1018   GFRNONAA 41 (L) 12/29/2018 0900   GFRAA 56 (L) 01/05/2019 1018   GFRAA 47 (L) 12/29/2018 0900    No results found for: SPEP, UPEP  Lab Results  Component Value Date   WBC 16.4 (H) 01/05/2019   NEUTROABS 10.8 (H) 01/05/2019   HGB 10.6 (L) 01/05/2019   HCT 34.0 (L) 01/05/2019   MCV 86.3 01/05/2019   PLT 385 01/05/2019      Chemistry      Component Value Date/Time   NA 136 01/05/2019 1018   NA 135 (L) 09/24/2017 0945   K 3.6 01/05/2019 1018   K 3.7 09/24/2017 0945   CL 99 01/05/2019 1018   CO2 28 01/05/2019 1018   CO2 30 (H) 09/24/2017 0945   BUN 27 (H) 01/05/2019 1018   BUN 15.3 09/24/2017 0945   CREATININE 1.06 (H) 01/05/2019 1018   CREATININE 1.22 (H) 12/29/2018 0900   CREATININE 0.8 09/24/2017 0945      Component Value Date/Time   CALCIUM 8.9 01/05/2019 1018   CALCIUM 9.1 09/24/2017 0945   ALKPHOS 75 01/05/2019 1018   ALKPHOS 52 09/24/2017 0945   AST 18 01/05/2019 1018   AST 26 12/29/2018 0900   AST 16 09/24/2017 0945   ALT 20 01/05/2019 1018   ALT 21 12/29/2018 0900   ALT 12 09/24/2017 0945   BILITOT 0.3 01/05/2019 1018   BILITOT 0.2 (L) 12/29/2018 0900   BILITOT 1.13 09/24/2017 0945       RADIOGRAPHIC STUDIES: I have personally reviewed the radiological images as listed and agreed with the findings in the report. Dg Chest 2 View  Result Date: 12/11/2018 CLINICAL DATA:  Pleural effusion, shortness of breath, cough EXAM: CHEST - 2 VIEW COMPARISON:  10/23/2018 FINDINGS: There is hyperinflation of the lungs compatible with COPD. PleurX catheter in place on the right, stable. No pneumothorax or visible effusions.  Heart is normal size. Bibasilar scarring. Right Port-A-Cath is unchanged. IMPRESSION: COPD.  Bibasilar scarring. Right PleurX catheter and Port-A-Cath unchanged. No visible pneumothorax. Electronically Signed   By: Rolm Baptise M.D.   On: 12/11/2018 11:18    All questions were answered. The patient knows to call the clinic with any problems, questions or concerns. No barriers to learning was detected.  I spent 15 minutes counseling the patient face to face. The total time spent in the appointment was 20 minutes and more than 50% was on counseling and review of test results  Heath Lark, MD 01/05/2019 2:02 PM

## 2019-01-05 NOTE — Assessment & Plan Note (Signed)
She has recovered fully from recent infection She will resume treatment every other week along with transfusion support as needed We will continue tumor marker monitoring and supportive care

## 2019-01-05 NOTE — Telephone Encounter (Signed)
Gave avs and calendar ° °

## 2019-01-05 NOTE — Assessment & Plan Note (Signed)
She has received blood transfusion recently She denies signs of bleeding She will continue her treatment as scheduled and transfusion as needed, to keep hemoglobin greater than 8

## 2019-01-05 NOTE — Telephone Encounter (Signed)
Pt daughter called wanting to speak with the nurse about medication she wanted to see if the dosage can be changed.

## 2019-01-05 NOTE — Assessment & Plan Note (Signed)
She has mild, persistent reduced breath sounds on lung bases.  She will continue close monitoring.

## 2019-01-05 NOTE — Patient Instructions (Signed)
[facility] Discharge Instructions for Patients Receiving Chemotherapy  Today you received the following chemotherapy agents:  Gemzar, Carboplatin  To help prevent nausea and vomiting after your treatment, we encourage you to take your nausea medication as prescribed.   If you develop nausea and vomiting that is not controlled by your nausea medication, call the clinic.   BELOW ARE SYMPTOMS THAT SHOULD BE REPORTED IMMEDIATELY:  *FEVER GREATER THAN 100.5 F  *CHILLS WITH OR WITHOUT FEVER  NAUSEA AND VOMITING THAT IS NOT CONTROLLED WITH YOUR NAUSEA MEDICATION  *UNUSUAL SHORTNESS OF BREATH  *UNUSUAL BRUISING OR BLEEDING  TENDERNESS IN MOUTH AND THROAT WITH OR WITHOUT PRESENCE OF ULCERS  *URINARY PROBLEMS  *BOWEL PROBLEMS  UNUSUAL RASH Items with * indicate a potential emergency and should be followed up as soon as possible.  Feel free to call the clinic should you have any questions or concerns. The clinic phone number is [phone].  Please show the CHEMO ALERT CARD at check-in to the Emergency Department and triage nurse.   

## 2019-01-06 ENCOUNTER — Telehealth: Payer: Self-pay

## 2019-01-06 LAB — CA 125: Cancer Antigen (CA) 125: 929 U/mL — ABNORMAL HIGH (ref 0.0–38.1)

## 2019-01-06 NOTE — Telephone Encounter (Signed)
Spoke with dtr, [name], and gave below msg. Reviewed upcoming appt dates and times.

## 2019-01-06 NOTE — Telephone Encounter (Signed)
-----   Message from Heath Lark, MD sent at 01/06/2019  8:22 AM EDT ----- Regarding: CA-125 Please call her daughter CA-125 is elevated but I think it related to the drain. I suggest we wait for repeat CA-125 next month before repeat CT

## 2019-01-07 ENCOUNTER — Telehealth: Payer: Self-pay | Admitting: Hematology and Oncology

## 2019-01-07 NOTE — Telephone Encounter (Signed)
Per patient's request appt moved from 05/11 to 05/12. Patient is notified.

## 2019-01-13 NOTE — Progress Notes (Unsigned)
[address]       [place] [address]             [phone]      [name] Medical Record #[id number] Date of Birth: 08/20/1933  Referring: Asencion Noble, MD Primary Care: Asencion Noble, MD Primary Cardiologist: Rozann Lesches, MD   Chief Complaint:   POST OP FOLLOW UP 06/24/2018 PREOPERATIVE DIAGNOSIS: Recurrent right pleural effusion with history of ovarian carcinoma. POSTOPERATIVE DIAGNOSIS: Recurrent right pleural effusion with history of ovarian carcinoma. SURGICAL PROCEDURE: Placement of right PleurX catheter with ultrasound and fluoroscopic guidance History of Present Illness:        I contacted [name] remotely due to the limitations of the current COVID pandemic on 01/13/2019  at  2:49 PM  verifying that I was speaking to [name] whose birthday is [birth year].   I discussed limitations of the evaluation and management  of patients remotely without  the benefit of physical exam.  The patient was agreeable with proceeding with a remote/ not face to face visit.  [name] was through her daughter    Patient drainage every 4 days has decreased to just below 100 mL each time  She has had no redness around the site    Recent CT of the abdomen was done in follow-up after underlying malignancy     Past Medical History:  Diagnosis Date   Anxiety    Chronic blood loss anemia    03-04-2018 diverticular bleed and rectal bleeding,  transfused 2 units PRBCs 03-08-2018   Colitis    COPD (chronic obstructive pulmonary disease) (HCC)    Dr. Lamonte Sakai   Depression    Diastolic CHF, chronic ([place])    Diverticulosis    Family history of colon cancer    Fibromyalgia    Genetic testing 04/07/2018   MyRisk (35 genes) @ [facility] - No pathogenic mutations detected   GERD (gastroesophageal reflux disease)    Hemorrhoids    Hiatal hernia    Hip pain 07/2018   Right Hip Pain   History of rectal polyps    History of  shingles    Hypothyroidism    IBS (irritable bowel syndrome)    Lymphocytic colitis    Dr. Henrene Pastor   Malignant ascites    Admission 06/2017 abdominal s/p parencentesis 07-01-2017 2.5L, 07-08-2016  2.7L, 07-12-2017  1425m   Neuropathy due to chemotherapeutic drug ([facility]    Osteoporosis    Ovarian cancer ([facility]    Chemotherapy - Dr. GAlvy Bimler  Paroxysmal atrial fibrillation ([facility] [place]    Xarelto stopped 03-07-2018 due to lower GI bleed   Pleural effusion    s/p  right thoracentesis, 02-2018 1.3L and 03-17-2018 right thoracentesis 6454m, post cxr no residual effusion   Psoriatic arthritis ([facility]   Schatzki's ring 2013   Seasonal allergic rhinitis      Social History   Tobacco Use  Smoking Status Former Smoker   Packs/day: 1.00   Years: 20.00   Pack years: 20.00   Types: Cigarettes   Last attempt to quit: 10/08/1980   Years since quitting: 38.2  Smokeless Tobacco Never Used    Social History   Substance and Sexual Activity  Alcohol Use Not Currently   Comment: rarely, 12-23-15 rarely     Allergies  Allergen Reactions   Codeine Itching    Current Outpatient Medications  Medication Sig Dispense Refill   albuterol (PROAIR HFA) 108 (90 Base) MCG/ACT  inhaler Inhale 2 puffs into the lungs every 6 (six) hours as needed for wheezing or shortness of breath. 1 Inhaler 3   ALPRAZolam (XANAX) 0.25 MG tablet Take 1 tablet (0.25 mg total) by mouth at bedtime as needed for anxiety. 5 tablet 0   amoxicillin (AMOXIL) 500 MG capsule TAKE 4 CAPSULES PO 1 HOUR PRIOR TO DENTAL APPOINTMENT  1   azelastine (ASTELIN) 0.1 % nasal spray Place 2 sprays into both nostrils 2 (two) times daily as needed for allergies.      CARTIA XT 120 MG 24 hr capsule      Cholecalciferol (VITAMIN D) 2000 units CAPS Take 2,000 Units by mouth daily.      dicyclomine (BENTYL) 10 MG capsule Take 1 capsule (10 mg total) by mouth 2 (two) times daily. 60 capsule 1   diltiazem (DILACOR XR) 180 MG 24  hr capsule Take 180 mg by mouth every morning.     diltiazem (TIAZAC) 120 MG 24 hr capsule Take 120 mg by mouth daily. At night time     diphenhydrAMINE (BENADRYL) 25 mg capsule Take 25 mg by mouth daily as needed for itching.      diphenoxylate-atropine (LOMOTIL) 2.5-0.025 MG tablet Take 1 tablet by mouth 4 (four) times daily. (Patient taking differently: Take 1 tablet by mouth 4 (four) times daily as needed for diarrhea or loose stools. ) 360 tablet 1   DULoxetine (CYMBALTA) 60 MG capsule Take 1 capsule (60 mg total) by mouth daily. 30 capsule 3   fluticasone (FLONASE) 50 MCG/ACT nasal spray Place 2 sprays into both nostrils 2 (two) times daily as needed (FOR NASAL CONGESTION.).      furosemide (LASIX) 40 MG tablet Take 1 tablet (40 mg total) by mouth daily. 90 tablet 3   halobetasol (ULTRAVATE) 0.05 % cream Apply 1 application topically 2 (two) times daily as needed (psoriasis).      levothyroxine (SYNTHROID, LEVOTHROID) 137 MCG tablet Take 137 mcg by mouth daily before breakfast. For thyroid therapy     Magnesium 400 MG CAPS Take 400 mg by mouth daily.     mirtazapine (REMERON) 15 MG tablet TAKE 1 TABLET(15 MG) BY MOUTH AT BEDTIME (Patient taking differently: Take 15 mg by mouth at bedtime. ) 30 tablet 11   Oxycodone HCl 10 MG TABS Take 1 tablet (10 mg total) by mouth every 6 (six) hours as needed. 60 tablet 0   polyvinyl alcohol (LIQUIFILM TEARS) 1.4 % ophthalmic solution Place 1 drop into both eyes 2 (two) times daily as needed for dry eyes.      potassium chloride SA (K-DUR,KLOR-CON) 20 MEQ tablet TAKE 1 TABLET BY MOUTH DAILY AND TAKE 1 ADDITIONAL TABLET WHEN TAKING EXTRA LASIX DOSE 60 tablet 6   STIOLTO RESPIMAT 2.5-2.5 MCG/ACT AERS INHALE 2 PUFFS INTO THE LUNGS DAILY 4 g 5   XARELTO 20 MG TABS tablet TAKE 1 TABLET BY MOUTH EVERY DAY AFTER SUPPER 90 tablet 3   No current facility-administered medications for this visit.    Facility-Administered Medications Ordered in Other  Visits  Medication Dose Route Frequency Provider Last Rate Last Dose   0.9 %  sodium chloride infusion (Manually program via Guardrails IV Fluids)  250 mL Intravenous Once Heath Lark, MD   Stopped at 12/16/18 1500   0.9 %  sodium chloride infusion   Intravenous Once Heath Lark, MD   Stopped at 12/16/18 1521   sodium chloride flush (NS) 0.9 % injection 10 mL  10 mL Intracatheter PRN Alvy Bimler,  Ni, MD   10 mL at 12/16/18 1451       Physical Exam:     Diagnostic Studies & Laboratory data:     Recent Radiology Findings: No results found. Dg Chest 2 View  Result Date: 12/11/2018 CLINICAL DATA:  Pleural effusion, shortness of breath, cough EXAM: CHEST - 2 VIEW COMPARISON:  10/23/2018 FINDINGS: There is hyperinflation of the lungs compatible with COPD. PleurX catheter in place on the right, stable. No pneumothorax or visible effusions. Heart is normal size. Bibasilar scarring. Right Port-A-Cath is unchanged. IMPRESSION: COPD.  Bibasilar scarring. Right PleurX catheter and Port-A-Cath unchanged. No visible pneumothorax. Electronically Signed   By: Rolm Baptise M.D.   On: 12/11/2018 11:18  I have independently reviewed the above radiology studies  and reviewed the findings with the patient.  Ct Abdomen Pelvis W Contrast  Result Date: 11/26/2018 CLINICAL DATA:  Ovarian cancer chemotherapy in progress. EXAM: CT ABDOMEN AND PELVIS WITH CONTRAST TECHNIQUE: Multidetector CT imaging of the abdomen and pelvis was performed using the standard protocol following bolus administration of intravenous contrast. CONTRAST:  175m OMNIPAQUE IOHEXOL 300 MG/ML  SOLN COMPARISON:  09/11/2018 and 09/18/2017. FINDINGS: Lower chest: 3 mm peripheral right lower lobe nodule (series 7, image 10) unchanged from 09/18/2017. Small bore chest tube is seen within a tiny right pleural effusion. Heart size normal. No pericardial effusion. Distal esophagus is grossly unremarkable. Hepatobiliary: Slight marginal irregularity of the  liver may be due to known peritoneal disease. Liver is otherwise unremarkable. Common bile duct dilatation is unchanged and likely related to cholecystectomy. Pancreas: Negative. Spleen: Negative. Adrenals/Urinary Tract: Adrenal glands and right kidney are unremarkable. Subcentimeter low-attenuation lesion in the left kidney is too small to characterize. Ureters are decompressed. Bladder is low in volume. Stomach/Bowel: Stomach and small bowel are unremarkable. Appendix is not readily visualized. Unobstructed colon extends into a wide neck supraumbilical midline ventral hernia. Colon is otherwise unremarkable. Vascular/Lymphatic: Atherosclerotic calcification of the aorta without aneurysm. Retroaortic left renal vein. No pathologically enlarged lymph nodes. Reproductive: Hysterectomy.  No adnexal mass. Other: Slight residual peritoneal thickening along the right and left walls of the abdomen and pelvis, overall improved from 09/11/2018. Ascites has resolved. Musculoskeletal: Postoperative changes in the proximal femur. Degenerative changes spine and. No worrisome lytic or sclerotic lesions. L5 superior endplate compression is unchanged. IMPRESSION: 1. Mild residual peritoneal thickening, overall decreased from 09/11/2018, with resolution of previously seen ascites. 2. Tiny residual right pleural effusion with small bore chest tube in place. 3. Supraumbilical midline ventral hernia has a wide neck and contains unobstructed colon. 4.  Aortic atherosclerosis (ICD10-170.0). Electronically Signed   By: MLorin PicketM.D.   On: 11/26/2018 12:16    I have independently reviewed the above radiology studies  and reviewed the findings with the patient.    Dg Chest 2 View  Result Date: 09/11/2018 CLINICAL DATA:  Shortness of breath. Pleural effusion. EXAM: CHEST - 2 VIEW COMPARISON:  08/14/2018 FINDINGS: Right jugular Port-A-Cath remains in place terminating over the lower SVC, unchanged. A right-sided tunneled  pleural catheter catheter is unchanged. The cardiomediastinal silhouette is unchanged with normal heart size. Aortic atherosclerosis is noted. The lungs remain hyperinflated with minimal scarring or atelectasis in the lung bases. No acute airspace consolidation or edema is identified. There is likely a trace right apical pneumothorax, and there is at most a trace right pleural effusion. No acute osseous abnormality is identified. IMPRESSION: Trace right apical pneumothorax and at most a trace right pleural effusion with right  pleural catheter in place. No evidence of acute airspace disease. Electronically Signed   By: Logan Bores M.D.   On: 09/11/2018 14:35    Dg Chest 2 View  Result Date: 08/14/2018 CLINICAL DATA:  Follow-up right pleural effusion.  COPD. EXAM: CHEST - 2 VIEW COMPARISON:  07/16/2018 chest radiograph. FINDINGS: Right basilar chest tube terminates in the medial upper right pleural space, unchanged. Right internal jugular MediPort terminates in the lower third of the SVC. Stable cardiomediastinal silhouette with normal heart size. No convincing pneumothorax. No significant pleural effusion. Hyperinflated lungs. No pulmonary edema. No acute consolidative airspace disease. Stable minimal bibasilar scarring versus atelectasis. IMPRESSION: 1. Stable right chest tube with no appreciable residual pneumothorax. 2. No significant pleural effusion. 3. Stable minimal bibasilar scarring versus atelectasis. 4. Hyperinflated lungs, compatible with the provided history of COPD. Electronically Signed   By: Ilona Sorrel M.D.   On: 08/14/2018 09:40   DCt Abdomen Pelvis W Contrast  Result Date: 07/23/2018 CLINICAL DATA:  Ovarian cancer, diagnosed 07/2015, status post surgery with adjunct chemotherapy. Oral chemotherapy ongoing. Recurrent pleural effusions. EXAM: CT ABDOMEN AND PELVIS WITH CONTRAST TECHNIQUE: Multidetector CT imaging of the abdomen and pelvis was performed using the standard protocol following  bolus administration of intravenous contrast. CONTRAST:  130m OMNIPAQUE IOHEXOL 300 MG/ML  SOLN COMPARISON:  04/21/2018 FINDINGS: Lower chest: Trace right pleural effusion with indwelling pleural drain. Hepatobiliary: Liver is within normal limits. Status post cholecystectomy. No intrahepatic ductal dilatation. Dilated common duct, measuring 11 mm, chronic. Pancreas: Within normal limits. Spleen: Within normal limits. Adrenals/Urinary Tract: Adrenal glands within normal limits. Kidneys are within normal limits.  No hydronephrosis. Bladder is underdistended but unremarkable. Stomach/Bowel: Stomach is within normal limits. No evidence of bowel obstruction. Very mild sigmoid diverticulosis. Vascular/Lymphatic: No evidence of abdominal aortic aneurysm. Atherosclerotic calcifications of the abdominal aorta and branch vessels. No suspicious abdominopelvic lymphadenopathy. Reproductive: Status post hysterectomy and bilateral salpingo-oophorectomy. Other: No abdominopelvic ascites. Mild peritoneal thickening/nodularity on the left (series 2/images 58-59). Mild soft tissue nodularity along the right pelvic cul-de-sac adjacent to the rectum (series 2/image 58). While equivocal, these findings are worrisome for very mild peritoneal disease. Musculoskeletal: Degenerative changes of the visualized thoracolumbar spine. IMPRESSION: Status post hysterectomy and bilateral salpingo-oophorectomy. Very mild peritoneal thickening/nodularity, equivocal but worrisome for very mild peritoneal disease. Attention on follow-up is suggested. Small right pleural effusion with indwelling pleural drain. Electronically Signed   By: SJulian HyM.D.   On: 07/23/2018 08:37    Recent Lab Findings: Lab Results  Component Value Date   WBC 16.4 (H) 01/05/2019   HGB 10.6 (L) 01/05/2019   HCT 34.0 (L) 01/05/2019   PLT 385 01/05/2019   GLUCOSE 86 01/05/2019   CHOL 147 11/14/2017   ALT 20 01/05/2019   AST 18 01/05/2019   NA 136  01/05/2019   K 3.6 01/05/2019   CL 99 01/05/2019   CREATININE 1.06 (H) 01/05/2019   BUN 27 (H) 01/05/2019   CO2 28 01/05/2019   TSH 0.388 11/10/2017   INR 1.09 06/24/2018   HGBA1C 5.7 (H) 06/12/2013      Assessment / Plan:   Continue drainage of Pleurx catheter, discussed with the daughter who is been providing the drainage to convert to once a week.  The catheter is probably ready to come out but will wait 3-4 more weeks draining it once a week until it safe for her to come to the office and/or outpatient to have the Pleurx removed.   We will call  the patient in 3 weeks and arrange for removal depending on how the limitations on elective procedures is going.  Grace Isaac MD      [address] [place] [address] Office [phone]   [facility] [phone]  01/13/2019 2:48 PM

## 2019-01-15 ENCOUNTER — Ambulatory Visit: Payer: Self-pay | Admitting: Cardiothoracic Surgery

## 2019-01-16 ENCOUNTER — Other Ambulatory Visit: Payer: Self-pay | Admitting: Hematology and Oncology

## 2019-01-19 ENCOUNTER — Inpatient Hospital Stay: Payer: Medicare Other | Attending: Hematology and Oncology

## 2019-01-19 ENCOUNTER — Inpatient Hospital Stay: Payer: Medicare Other

## 2019-01-19 ENCOUNTER — Inpatient Hospital Stay (HOSPITAL_BASED_OUTPATIENT_CLINIC_OR_DEPARTMENT_OTHER): Payer: Medicare Other | Admitting: Hematology and Oncology

## 2019-01-19 ENCOUNTER — Encounter: Payer: Self-pay | Admitting: Hematology and Oncology

## 2019-01-19 ENCOUNTER — Other Ambulatory Visit: Payer: Self-pay

## 2019-01-19 DIAGNOSIS — D638 Anemia in other chronic diseases classified elsewhere: Secondary | ICD-10-CM | POA: Insufficient documentation

## 2019-01-19 DIAGNOSIS — Z79899 Other long term (current) drug therapy: Secondary | ICD-10-CM | POA: Insufficient documentation

## 2019-01-19 DIAGNOSIS — Z5111 Encounter for antineoplastic chemotherapy: Secondary | ICD-10-CM

## 2019-01-19 DIAGNOSIS — C786 Secondary malignant neoplasm of retroperitoneum and peritoneum: Secondary | ICD-10-CM | POA: Insufficient documentation

## 2019-01-19 DIAGNOSIS — G893 Neoplasm related pain (acute) (chronic): Secondary | ICD-10-CM | POA: Insufficient documentation

## 2019-01-19 DIAGNOSIS — K862 Cyst of pancreas: Secondary | ICD-10-CM | POA: Diagnosis not present

## 2019-01-19 DIAGNOSIS — K5900 Constipation, unspecified: Secondary | ICD-10-CM | POA: Diagnosis not present

## 2019-01-19 DIAGNOSIS — I700 Atherosclerosis of aorta: Secondary | ICD-10-CM | POA: Insufficient documentation

## 2019-01-19 DIAGNOSIS — Z90722 Acquired absence of ovaries, bilateral: Secondary | ICD-10-CM | POA: Diagnosis not present

## 2019-01-19 DIAGNOSIS — D61818 Other pancytopenia: Secondary | ICD-10-CM | POA: Diagnosis not present

## 2019-01-19 DIAGNOSIS — R971 Elevated cancer antigen 125 [CA 125]: Secondary | ICD-10-CM | POA: Insufficient documentation

## 2019-01-19 DIAGNOSIS — N183 Chronic kidney disease, stage 3 (moderate): Secondary | ICD-10-CM | POA: Diagnosis not present

## 2019-01-19 DIAGNOSIS — Z90710 Acquired absence of both cervix and uterus: Secondary | ICD-10-CM

## 2019-01-19 DIAGNOSIS — J90 Pleural effusion, not elsewhere classified: Secondary | ICD-10-CM | POA: Insufficient documentation

## 2019-01-19 DIAGNOSIS — C562 Malignant neoplasm of left ovary: Secondary | ICD-10-CM

## 2019-01-19 DIAGNOSIS — K5909 Other constipation: Secondary | ICD-10-CM

## 2019-01-19 LAB — COMPREHENSIVE METABOLIC PANEL
ALT: 13 U/L (ref 0–44)
AST: 20 U/L (ref 15–41)
Albumin: 2.8 g/dL — ABNORMAL LOW (ref 3.5–5.0)
Alkaline Phosphatase: 63 U/L (ref 38–126)
Anion gap: 9 (ref 5–15)
BUN: 13 mg/dL (ref 8–23)
CO2: 27 mmol/L (ref 22–32)
Calcium: 8.7 mg/dL — ABNORMAL LOW (ref 8.9–10.3)
Chloride: 99 mmol/L (ref 98–111)
Creatinine, Ser: 1.08 mg/dL — ABNORMAL HIGH (ref 0.44–1.00)
GFR calc Af Amer: 55 mL/min — ABNORMAL LOW (ref >60)
GFR calc non Af Amer: 47 mL/min — ABNORMAL LOW (ref >60)
Glucose, Bld: 129 mg/dL — ABNORMAL HIGH (ref 70–99)
Potassium: 3.8 mmol/L (ref 3.5–5.1)
Sodium: 135 mmol/L (ref 135–145)
Total Bilirubin: 0.2 mg/dL — ABNORMAL LOW (ref 0.3–1.2)
Total Protein: 6.9 g/dL (ref 6.5–8.1)

## 2019-01-19 LAB — SAMPLE TO BLOOD BANK

## 2019-01-19 LAB — CBC WITH DIFFERENTIAL/PLATELET
Abs Immature Granulocytes: 0.07 10*3/uL (ref 0.00–0.07)
Basophils Absolute: 0 10*3/uL (ref 0.0–0.1)
Basophils Relative: 0 %
Eosinophils Absolute: 0.2 10*3/uL (ref 0.0–0.5)
Eosinophils Relative: 4 %
HCT: 29.1 % — ABNORMAL LOW (ref 36.0–46.0)
Hemoglobin: 8.9 g/dL — ABNORMAL LOW (ref 12.0–15.0)
Immature Granulocytes: 1 %
Lymphocytes Relative: 14 %
Lymphs Abs: 0.9 10*3/uL (ref 0.7–4.0)
MCH: 26.3 pg (ref 26.0–34.0)
MCHC: 30.6 g/dL (ref 30.0–36.0)
MCV: 85.8 fL (ref 80.0–100.0)
Monocytes Absolute: 0.7 10*3/uL (ref 0.1–1.0)
Monocytes Relative: 12 %
Neutro Abs: 4.3 10*3/uL (ref 1.7–7.7)
Neutrophils Relative %: 69 %
Platelets: 215 10*3/uL (ref 150–400)
RBC: 3.39 MIL/uL — ABNORMAL LOW (ref 3.87–5.11)
RDW: 17.2 % — ABNORMAL HIGH (ref 11.5–15.5)
WBC: 6.3 10*3/uL (ref 4.0–10.5)
nRBC: 0 % (ref 0.0–0.2)

## 2019-01-19 MED ORDER — SODIUM CHLORIDE 0.9 % IV SOLN
400.0000 mg/m2 | Freq: Once | INTRAVENOUS | Status: AC
  Administered 2019-01-19: 12:00:00 760 mg via INTRAVENOUS
  Filled 2019-01-19: qty 19.99

## 2019-01-19 MED ORDER — PROCHLORPERAZINE MALEATE 10 MG PO TABS
10.0000 mg | ORAL_TABLET | Freq: Once | ORAL | Status: AC
  Administered 2019-01-19: 10 mg via ORAL

## 2019-01-19 MED ORDER — PROCHLORPERAZINE MALEATE 10 MG PO TABS
ORAL_TABLET | ORAL | Status: AC
  Filled 2019-01-19: qty 1

## 2019-01-19 MED ORDER — SODIUM CHLORIDE 0.9% FLUSH
10.0000 mL | Freq: Once | INTRAVENOUS | Status: AC
  Administered 2019-01-19: 10 mL
  Filled 2019-01-19: qty 10

## 2019-01-19 MED ORDER — HEPARIN SOD (PORK) LOCK FLUSH 100 UNIT/ML IV SOLN
500.0000 [IU] | Freq: Once | INTRAVENOUS | Status: AC | PRN
  Administered 2019-01-19: 500 [IU]
  Filled 2019-01-19: qty 5

## 2019-01-19 MED ORDER — SODIUM CHLORIDE 0.9% FLUSH
10.0000 mL | INTRAVENOUS | Status: DC | PRN
  Administered 2019-01-19: 10 mL
  Filled 2019-01-19: qty 10

## 2019-01-19 MED ORDER — SODIUM CHLORIDE 0.9 % IV SOLN
Freq: Once | INTRAVENOUS | Status: AC
  Administered 2019-01-19: 11:00:00 via INTRAVENOUS
  Filled 2019-01-19: qty 250

## 2019-01-19 NOTE — Assessment & Plan Note (Signed)
She has intermittent constipation secondary to side effects of medication I advised her to take stool softener and laxative every time she take pain medicine

## 2019-01-19 NOTE — Patient Instructions (Signed)
[facility] Discharge Instructions for Patients Receiving Chemotherapy  Today you received the following chemotherapy agents:  Gemzar (gemcitibine)  To help prevent nausea and vomiting after your treatment, we encourage you to take your nausea medication as prescribed.   If you develop nausea and vomiting that is not controlled by your nausea medication, call the clinic.   BELOW ARE SYMPTOMS THAT SHOULD BE REPORTED IMMEDIATELY:  *FEVER GREATER THAN 100.5 F  *CHILLS WITH OR WITHOUT FEVER  NAUSEA AND VOMITING THAT IS NOT CONTROLLED WITH YOUR NAUSEA MEDICATION  *UNUSUAL SHORTNESS OF BREATH  *UNUSUAL BRUISING OR BLEEDING  TENDERNESS IN MOUTH AND THROAT WITH OR WITHOUT PRESENCE OF ULCERS  *URINARY PROBLEMS  *BOWEL PROBLEMS  UNUSUAL RASH Items with * indicate a potential emergency and should be followed up as soon as possible.  Feel free to call the clinic should you have any questions or concerns. The clinic phone number is [phone].  Please show the [place] at check-in to the Emergency Department and triage nurse.

## 2019-01-19 NOTE — Progress Notes (Signed)
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  She will continue close monitoring.  Other constipation She has intermittent constipation secondary to side effects of medication I advised her to take stool softener and laxative every time she take pain medicine   No orders of the defined types were placed in this encounter.   INTERVAL HISTORY: Please see below for problem oriented charting. She returns for chemotherapy and follow-up She had recent thoracentesis at home and felt better She has occasional constipation with narcotic prescription Denies nausea or bloating The patient denies any recent signs or symptoms of bleeding such as spontaneous epistaxis, hematuria or hematochezia. 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 Interim finding of moderate ascites within the abdomen and pelvis with additional finding of diffuse nodular infiltration of the omentum and anterior mesenteric fat, the appearance would be consistent with peritoneal carcinomatosis/metastatic disease. Increasing retroperitoneal and upper abdominal adenopathy. 2. Re- demonstrated 3.8 cm cyst in the right adnexa. Enlarging soft tissue density in the left adnexa now with possible cystic component posteriorly. In light of the above findings, concern is for ovarian neoplasm. Correlation with pelvic ultrasound recommended. 3. Small right-sided pleural effusion, new since prior study 4. Stable hypodense splenic lesions since 2017.     06/25/2017 Imaging    US pelvis: 2.9 cm simple appearing cyst in the right ovary. Left ovary grossly unremarkable. Large volume ascites in the pelvis    06/30/2017 - 07/01/2017 Hospital Admission    She was admitted for evaluation of abdominal pain and ascites    07/01/2017 Pathology Results    PERITONEAL/ASCITIC FLUID(SPECIMEN 1 OF 1 COLLECTED 07/01/17): - POORLY DIFFERENTIATED CARCINOMA; SEE COMMENT Source Peritoneal/Ascitic Fluid, (specimen 1 of 1 collected 07/01/17) Gross Specimen: Received is/are 1000 cc's of brownish fluid. (BS:bs) Prepared: # Smears: 0 # Concentration Technique Slides (i.e. ThinPrep): 1 # Cell Block: 1 Additional Studies: Also received Hematology slide -  [id number]. Comment The tumor cells are positive for cytokeratin 7 and Pax-8 but negative for cytokeratin 20, CDX-2, GATA-3, Napsin-A and TTF-1. Based on the  immunoprofile a gynecology primary is favored    07/01/2017 Procedure    Successful ultrasound-guided diagnostic and therapeutic paracentesis yielding 2.5 liters of peritoneal fluid    07/07/2017 - 07/09/2017 Hospital Admission    She was admitted for management of malignant ascites    07/08/2017 Procedure    Successful ultrasound-guided therapeutic paracentesis yielding 2.7 liters liters of peritoneal fluid    07/12/2017 Procedure    Successful ultrasound-guided paracentesis yielding 1450 mL of peritoneal fluid    07/18/2017 - 07/24/2017 Hospital Admission    She was admitted for expedited treatment    07/18/2017 Tumor Marker    Patient's tumor was tested for the following markers: CA125 Results of the tumor marker test revealed 1941    07/19/2017 - 02/04/2018 Chemotherapy    The patient had 6 cycles of carboplatin & Taxol for chemotherapy treatment, followed by 3 more cycles of carboplatin only     07/19/2017 - 02/04/2018 Chemotherapy    The patient had carboplatin and taxol    08/06/2017 Procedure    Successful ultrasound-guided therapeutic paracentesis yielding 2.6 liters of peritoneal fluid.    08/09/2017 Tumor Marker    Patient's tumor was tested for the following markers: CA125 Results of the tumor marker test revealed 1665    08/15/2017 Tumor Marker    Patient's tumor was tested for the following markers: CA125 Results of the tumor marker test revealed 937.9    08/20/2017 Imaging    ECHO: Normal LV size with EF 60-65%. Normal RV size and systolic function. No significant valvular abnormalities.    09/18/2017 Imaging    Chest Impression:  1. No evidence thoracic metastasis. 2. Interval increase and RIGHT pleural effusion.  Abdomen / Pelvis Impression:  1. Interval decrease in intraperitoneal free fluid. 2. Interval decrease in omental nodularity in the LEFT ventral peritoneal space. 3. Interval decrease in nodularity associated with the LEFT ovary. 4. Cystic portion  of the RIGHT ovary is increased mildly in size.    09/20/2017 Tumor Marker    Patient's tumor was tested for the following markers: CA125 Results of the tumor marker test revealed 347    10/14/2017 Tumor Marker    Patient's tumor was tested for the following markers: CA125 Results of the tumor marker test revealed 307.4    11/04/2017 Tumor Marker    Patient's tumor was tested for the following markers: CA125 Results of the tumor marker test revealed 262.5    11/28/2017 Imaging    1. Interval decrease in right pleural effusion with resolution of right atelectasis seen previously. 2. New small left pleural effusion, symmetric to the right. 3. No intraperitoneal free fluid on the current study. 4. Continued further decrease in left omental disease, appearing less confluent today than on the prior study. 5. Left ovary remains normal in appearance today and the right adnexal cystic lesion is decreased in size compared to prior study. 6. 14 mm pancreatic cyst is unchanged. Continued attention on follow-up imaging recommended. 7. Aortic Atherosclerois (ICD10-170.0)    12/13/2017 Tumor Marker    Patient's tumor was tested for the following markers: CA125 Results of the tumor marker test revealed 197.7    01/03/2018 Tumor Marker    Patient's tumor was tested for the following markers: CA125 Results of the tumor marker test revealed 183.1    01/14/2018 Tumor Marker    Patient's  tumor was tested for the following markers: CA125 Results of the tumor marker test revealed 177.4    02/04/2018 Tumor Marker    Patient's tumor was tested for the following markers: CA125 Results of the tumor marker test revealed 168.5    02/25/2018 Imaging    1. Omental carcinomatosis appears qualitatively stable to slightly decreased. Stable mild peritoneal thickening in the paracolic gutters. Stable right adnexal cyst. No ascites. No new or progressive metastatic disease in the abdomen or pelvis. 2. Small dependent  right pleural effusion is increased. 3. Cystic pancreatic body lesion is decreased and now subcentimeter in size, suggesting a benign lesion. 4. Aortic Atherosclerosis (ICD10-I70.0).    03/03/2018 - 03/07/2018 Hospital Admission    She was hospitalized for GI bleed requiring blood transfusions. Xarelto was placed on hold    03/07/2018 PET scan    1. Persistent hazy omental interstitial nodularity but no hypermetabolism or discrete measurable nodules. No abdominal ascites. 2. No findings for metastatic disease involving the chest. 3. Moderate-sized right pleural effusion and small left pleural effusion.     03/20/2018 Pathology Results    1. Ovary and fallopian tube, right - OVARY AND FALLOPIAN TUBE INVOLVED BY SEROUS CARCINOMA. - PARATUBAL CYST. 2. Uterus +/- tubes/ovaries, neoplastic, cervix, left ovary and fallopian tube - LEFT OVARY: HIGH GRADE SEROUS CARCINOMA WITH TREATMENT EFFECT, SPANNING 2.5 CM. CARCINOMA INVOLVES OVARIAN SURFACE. SEE ONCOLOGY TABLE. - LEFT FALLOPIAN TUBE: INVOLVED BY SEROUS CARCINOMA. - UTERUS: -ENDOMETRIUM: INACTIVE ENDOMETRIUM. NO HYPERPLASIA OR MALIGNANCY. -MYOMETRIUM: UNREMARKABLE. NO MALIGNANCY. -SEROSA: INVOLVED BY SEROUS CARCINOMA. - CERVIX: ENDOCERVICAL POLYP. NO MALIGNANCY. 3. Omentum, resection for tumor - INVOLVED BY SEROUS CARCINOMA. 4. Soft tissue, biopsy, mesenteric nodule - INVOLVED BY SEROUS CARCINOMA. Microscopic Comment 2. OVARY or FALLOPIAN TUBE or PRIMARY PERITONEUM: Procedure: Total hysterectomy and bilateral salpingo-oophorectomy. Omentectomy. Mesenteric lymph node biopsy. Specimen Integrity: Intact. Tumor Site: Left ovary. Ovarian Surface Involvement (required only if applicable): Present. Fallopian Tube Surface Involvement (required only if applicable): Present, bilateral. Tumor Size: 2.5 cm. Histologic Type: High grade serous carcinoma. Histologic Grade: High grade. Implants (required for advanced stage serous/seromucinous  borderline tumors only): N/A. Other Tissue/ Organ Involvement: Bilateral fallopian tubes, right ovary, uterine serosa, omentum. Largest Extrapelvic Peritoneal Focus (required only if applicable): Microscopic, estimated 0.5 cm (omentum). Peritoneal/Ascitic Fluid: Prior Positive ([id number]). Treatment Effect (required only for high-grade serous carcinomas): Present in left ovary. CRS2. Regional Lymph Nodes: No lymph nodes submitted/identified. Pathologic Stage Classification (pTNM, AJCC 8th Edition): ypT3b, ypNX Representative Tumor Block: 1A, 1B, 926F, 26F. Comment(s): The right ovary has only surface deposits with a large paratubal cyst. The left ovary has intraparenchymal tumor with associated treatment effect. Thus the tumor location is classified as a left ovarian primary.    03/20/2018 Surgery    Procedure(s) Performed:  1. Exploratory laparotomy with total hysterectomy and bilateral salpingo-oophorectomy 2. Infragastic Omentectomy  3. Debulking to <1cm gross residual disease   Surgeon: Mart Piggs, MD  Specimens: Uterus Cervix, Bilateral tubes / ovaries and omentum. Mesenteric nodule.  Operative Findings: Debulked to gross residual disease <1cm; however there is miliary disease in multiple locations including the majority of the abdominal peritoneum (anterior abdominal wall, bilateral gutters), diaphragm (Right>left), majority of small bowel mesentary. Normal appendix. Normal small uterus. Right ovary with a cystic lesion ~3cm, some adhesive disease of right adnexa to rectum/sigmoid. Gross omental disease, which was resected with the omentectomy. Smooth liver surface, but again, diaphragmatic disease noted.       03/20/2018 Genetic Testing  Patient has genetic testing done for ER/PR. Results revealed patient has ER: 90%, PR 0%.     03/31/2018 Tumor Marker    Patient's tumor was tested for the following markers: CA125 Results of the tumor marker test revealed 215.8      Genetic Testing    Patient has genetic testing done for BRCA 1. Results revealed patient has the following: BRCA 1: no loss of expression.     Genetic Testing    Patient has genetic testing done for MMR . Results revealed patient has the following:  MMR: normal    03/31/2018 Genetic Testing    Patient has genetic testing done for BRCA1/2. Results revealed patient has no actionable mutations. She is found to have [place] genetic change of unknown significance    04/21/2018 Imaging    1. No definite findings of residual or recurrent metastatic disease in the abdomen or pelvis status post interval TAHBSO and omentectomy. Stable minimal thickening in the paracolic gutters without discrete peritoneal nodularity. 2. Trace free fluid in the pelvic cul-de-sac. 3. Stable small dependent right pleural effusion. 4. Subcentimeter pancreatic body cystic lesion is stable to slightly decreased. 5. Aortic Atherosclerosis (ICD10-I70.0).    04/21/2018 Tumor Marker    Patient's tumor was tested for the following markers: CA125 Results of the tumor marker test revealed 187.3    04/23/2018 - 09/12/2018 Anti-estrogen oral therapy    She is placed on Femara    05/08/2018 Procedure    Successful ultrasound guided right thoracentesis yielding 800 mL of pleural fluid. Fluid cytology is negative for malignancy     05/28/2018 Tumor Marker    Patient's tumor was tested for the following markers: CA125 Results of the tumor marker test revealed 190    06/30/2018 Tumor Marker    Patient's tumor was tested for the following markers: CA125 Results of the tumor marker test revealed 148    07/23/2018 Imaging    Status post hysterectomy and bilateral salpingo-oophorectomy.  Very mild peritoneal thickening/nodularity, equivocal but worrisome for very mild peritoneal disease. Attention on follow-up is suggested.  Small right pleural effusion with indwelling pleural drain.    07/23/2018 Tumor Marker    Patient's  tumor was tested for the following markers: CA125 Results of the tumor marker test revealed 215    09/11/2018 Tumor Marker    Patient's tumor was tested for the following markers: CA-125. Results of the tumor marker test revealed 1323    09/12/2018 Imaging    1. Mildly progressive ascites with some mild degree of nodularity most appreciable along the left adnexa, right paracolic gutter, right upper quadrant, compatible with peritoneal spread of tumor. 2. Small right pleural effusion with indwelling right pleural catheter again noted. Mild increase in atelectasis anteriorly at the right lung base. 3. Aortic Atherosclerosis (ICD10-I70.0). Coronary atherosclerosis. 4. Several tiny hypodense lesions in the spleen are technically nonspecific, but stable. 5. Air fluid level in the rectum compatible with diarrheal process. 6. Prominent bilateral hip arthropathy. Left hip screw noted.    09/22/2018 -  Chemotherapy    The patient had carboplatin and gemzar    09/29/2018 Tumor Marker    Patient's tumor was tested for the following markers: CA125 Results of the tumor marker test revealed 1219    09/29/2018 Adverse Reaction    Cycle 1 day 8 of Gemzar was omitted due to severe anemia    10/20/2018 Tumor Marker    Patient's tumor was tested for the following markers: CA125 Results of the tumor marker  test revealed 402    11/13/2018 Tumor Marker    Patient's tumor was tested for the following markers: CA125 Results of the tumor marker test revealed 437    11/26/2018 Imaging    1. Mild residual peritoneal thickening, overall decreased from 09/11/2018, with resolution of previously seen ascites. 2. Tiny residual right pleural effusion with small bore chest tube in place. 3. Supraumbilical midline ventral hernia has a wide neck and contains unobstructed colon. 4.  Aortic atherosclerosis (ICD10-170.0).     01/05/2019 Tumor Marker    Patient's tumor was tested for the following markers:  CA125 Results of the tumor marker test revealed 929     REVIEW OF SYSTEMS:   Constitutional: Denies fevers, chills or abnormal weight loss Eyes: Denies blurriness of vision Ears, nose, mouth, throat, and face: Denies mucositis or sore throat Cardiovascular: Denies palpitation, chest discomfort or lower extremity swelling Skin: Denies abnormal skin rashes Lymphatics: Denies new lymphadenopathy or easy bruising Neurological:Denies numbness, tingling or new weaknesses Behavioral/Psych: Mood is stable, no new changes  All other systems were reviewed with the patient and are negative.  I have reviewed the past medical history, past surgical history, social history and family history with the patient and they are unchanged from previous note.  ALLERGIES:  is allergic to codeine.  MEDICATIONS:  Current Outpatient Medications  Medication Sig Dispense Refill  . albuterol (PROAIR HFA) 108 (90 Base) MCG/ACT inhaler Inhale 2 puffs into the lungs every 6 (six) hours as needed for wheezing or shortness of breath. 1 Inhaler 3  . ALPRAZolam (XANAX) 0.25 MG tablet Take 1 tablet (0.25 mg total) by mouth at bedtime as needed for anxiety. 5 tablet 0  . amoxicillin (AMOXIL) 500 MG capsule TAKE 4 CAPSULES PO 1 HOUR PRIOR TO DENTAL APPOINTMENT  1  . azelastine (ASTELIN) 0.1 % nasal spray Place 2 sprays into both nostrils 2 (two) times daily as needed for allergies.     Marland Kitchen CARTIA XT 120 MG 24 hr capsule     . Cholecalciferol (VITAMIN D) 2000 units CAPS Take 2,000 Units by mouth daily.     Marland Kitchen dicyclomine (BENTYL) 10 MG capsule Take 1 capsule (10 mg total) by mouth 2 (two) times daily. 60 capsule 1  . diltiazem (DILACOR XR) 180 MG 24 hr capsule Take 180 mg by mouth every morning.    . diltiazem (TIAZAC) 120 MG 24 hr capsule Take 120 mg by mouth daily. At night time    . diphenhydrAMINE (BENADRYL) 25 mg capsule Take 25 mg by mouth daily as needed for itching.     . diphenoxylate-atropine (LOMOTIL) 2.5-0.025 MG  tablet Take 1 tablet by mouth 4 (four) times daily. (Patient taking differently: Take 1 tablet by mouth 4 (four) times daily as needed for diarrhea or loose stools. ) 360 tablet 1  . DULoxetine (CYMBALTA) 60 MG capsule Take 1 capsule (60 mg total) by mouth daily. 30 capsule 3  . fluticasone (FLONASE) 50 MCG/ACT nasal spray Place 2 sprays into both nostrils 2 (two) times daily as needed (FOR NASAL CONGESTION.).     Marland Kitchen furosemide (LASIX) 40 MG tablet Take 1 tablet (40 mg total) by mouth daily. 90 tablet 3  . halobetasol (ULTRAVATE) 0.05 % cream Apply 1 application topically 2 (two) times daily as needed (psoriasis).     Marland Kitchen levothyroxine (SYNTHROID, LEVOTHROID) 137 MCG tablet Take 137 mcg by mouth daily before breakfast. For thyroid therapy    . Magnesium 400 MG CAPS Take 400 mg by mouth  daily.    . mirtazapine (REMERON) 15 MG tablet TAKE 1 TABLET(15 MG) BY MOUTH AT BEDTIME (Patient taking differently: Take 15 mg by mouth at bedtime. ) 30 tablet 11  . Oxycodone HCl 10 MG TABS Take 1 tablet (10 mg total) by mouth every 6 (six) hours as needed. 60 tablet 0  . polyvinyl alcohol (LIQUIFILM TEARS) 1.4 % ophthalmic solution Place 1 drop into both eyes 2 (two) times daily as needed for dry eyes.     . potassium chloride SA (K-DUR,KLOR-CON) 20 MEQ tablet TAKE 1 TABLET BY MOUTH DAILY AND TAKE 1 ADDITIONAL TABLET WHEN TAKING EXTRA LASIX DOSE 60 tablet 6  . STIOLTO RESPIMAT 2.5-2.5 MCG/ACT AERS INHALE 2 PUFFS INTO THE LUNGS DAILY 4 g 5  . XARELTO 20 MG TABS tablet TAKE 1 TABLET BY MOUTH EVERY DAY AFTER SUPPER 90 tablet 3   No current facility-administered medications for this visit.    Facility-Administered Medications Ordered in Other Visits  Medication Dose Route Frequency Provider Last Rate Last Dose  . 0.9 %  sodium chloride infusion (Manually program via Guardrails IV Fluids)  250 mL Intravenous Once Heath Lark, MD   Stopped at 12/16/18 1500  . 0.9 %  sodium chloride infusion   Intravenous Once Heath Lark,  MD   Stopped at 12/16/18 1521  . gemcitabine (GEMZAR) 760 mg in sodium chloride 0.9 % 100 mL chemo infusion  400 mg/m2 (Treatment Plan Recorded) Intravenous Once Alvy Bimler, Chrstopher Malenfant, MD      . heparin lock flush 100 unit/mL  500 Units Intracatheter Once PRN Alvy Bimler, Adie Vilar, MD      . prochlorperazine (COMPAZINE) tablet 10 mg  10 mg Oral Once Luretta Everly, MD      . sodium chloride flush (NS) 0.9 % injection 10 mL  10 mL Intracatheter PRN Alvy Bimler, Amaad Byers, MD   10 mL at 12/16/18 1451  . sodium chloride flush (NS) 0.9 % injection 10 mL  10 mL Intracatheter PRN Alvy Bimler, Jakeob Tullis, MD        PHYSICAL EXAMINATION: ECOG PERFORMANCE STATUS: 2 - Symptomatic, <50% confined to bed  Vitals:   01/19/19 1020  BP: (!) 129/58  Pulse: (!) 102  Resp: 16  Temp: 98.6 F (37 C)  SpO2: 98%   Filed Weights   01/19/19 1020  Weight: 181 lb 4.8 oz (82.2 kg)    GENERAL:alert, no distress and comfortable SKIN: skin color, texture, turgor are normal, no rashes or significant lesions EYES: normal, Conjunctiva are pink and non-injected, sclera clear OROPHARYNX:no exudate, no erythema and lips, buccal mucosa, and tongue normal  NECK: supple, thyroid normal size, non-tender, without nodularity LYMPH:  no palpable lymphadenopathy in the cervical, axillary or inguinal LUNGS: Mildly reduced breath sounds in lung bases HEART: regular rate & rhythm and no murmurs and no lower extremity edema ABDOMEN:abdomen soft, non-tender and normal bowel sounds Musculoskeletal:no cyanosis of digits and no clubbing  NEURO: alert & oriented x 3 with fluent speech, no focal motor/sensory deficits  LABORATORY DATA:  I have reviewed the data as listed    Component Value Date/Time   NA 135 01/19/2019 0929   NA 135 (L) 09/24/2017 0945   K 3.8 01/19/2019 0929   K 3.7 09/24/2017 0945   CL 99 01/19/2019 0929   CO2 27 01/19/2019 0929   CO2 30 (H) 09/24/2017 0945   GLUCOSE 129 (H) 01/19/2019 0929   GLUCOSE 101 09/24/2017 0945   BUN 13 01/19/2019 0929    BUN 15.3 09/24/2017 0945   CREATININE  1.08 (H) 01/19/2019 0929   CREATININE 1.22 (H) 12/29/2018 0900   CREATININE 0.8 09/24/2017 0945   CALCIUM 8.7 (L) 01/19/2019 0929   CALCIUM 9.1 09/24/2017 0945   PROT 6.9 01/19/2019 0929   PROT 6.6 09/24/2017 0945   ALBUMIN 2.8 (L) 01/19/2019 0929   ALBUMIN 3.3 (L) 09/24/2017 0945   AST 20 01/19/2019 0929   AST 26 12/29/2018 0900   AST 16 09/24/2017 0945   ALT 13 01/19/2019 0929   ALT 21 12/29/2018 0900   ALT 12 09/24/2017 0945   ALKPHOS 63 01/19/2019 0929   ALKPHOS 52 09/24/2017 0945   BILITOT 0.2 (L) 01/19/2019 0929   BILITOT 0.2 (L) 12/29/2018 0900   BILITOT 1.13 09/24/2017 0945   GFRNONAA 47 (L) 01/19/2019 0929   GFRNONAA 41 (L) 12/29/2018 0900   GFRAA 55 (L) 01/19/2019 0929   GFRAA 47 (L) 12/29/2018 0900    No results found for: SPEP, UPEP  Lab Results  Component Value Date   WBC 6.3 01/19/2019   NEUTROABS 4.3 01/19/2019   HGB 8.9 (L) 01/19/2019   HCT 29.1 (L) 01/19/2019   MCV 85.8 01/19/2019   PLT 215 01/19/2019      Chemistry      Component Value Date/Time   NA 135 01/19/2019 0929   NA 135 (L) 09/24/2017 0945   K 3.8 01/19/2019 0929   K 3.7 09/24/2017 0945   CL 99 01/19/2019 0929   CO2 27 01/19/2019 0929   CO2 30 (H) 09/24/2017 0945   BUN 13 01/19/2019 0929   BUN 15.3 09/24/2017 0945   CREATININE 1.08 (H) 01/19/2019 0929   CREATININE 1.22 (H) 12/29/2018 0900   CREATININE 0.8 09/24/2017 0945      Component Value Date/Time   CALCIUM 8.7 (L) 01/19/2019 0929   CALCIUM 9.1 09/24/2017 0945   ALKPHOS 63 01/19/2019 0929   ALKPHOS 52 09/24/2017 0945   AST 20 01/19/2019 0929   AST 26 12/29/2018 0900   AST 16 09/24/2017 0945   ALT 13 01/19/2019 0929   ALT 21 12/29/2018 0900   ALT 12 09/24/2017 0945   BILITOT 0.2 (L) 01/19/2019 0929   BILITOT 0.2 (L) 12/29/2018 0900   BILITOT 1.13 09/24/2017 0945      All questions were answered. The patient knows to call the clinic with any problems, questions or concerns. No  barriers to learning was detected.  I spent 15 minutes counseling the patient face to face. The total time spent in the appointment was 20 minutes and more than 50% was on counseling and review of test results  Heath Lark, MD 01/19/2019 10:48 AM

## 2019-01-19 NOTE — Assessment & Plan Note (Signed)
She has received blood transfusion recently She denies signs of bleeding She will continue her treatment as scheduled and transfusion as needed, to keep hemoglobin greater than 8

## 2019-01-19 NOTE — Assessment & Plan Note (Signed)
She will continue treatment every other week along with transfusion support as needed We will continue tumor marker monitoring and supportive care Her next imaging study is due next month I will discuss the timing of imaging study with her when I see her back in the next visit in 2 weeks

## 2019-01-19 NOTE — Assessment & Plan Note (Signed)
She has mild, persistent reduced breath sounds on lung bases.  She will continue close monitoring.

## 2019-01-20 ENCOUNTER — Ambulatory Visit: Payer: Self-pay | Admitting: Emergency Medicine

## 2019-01-21 ENCOUNTER — Ambulatory Visit (INDEPENDENT_AMBULATORY_CARE_PROVIDER_SITE_OTHER): Payer: Medicare Other | Admitting: Nurse Practitioner

## 2019-01-21 ENCOUNTER — Telehealth: Payer: Self-pay

## 2019-01-21 ENCOUNTER — Encounter: Payer: Self-pay | Admitting: Nurse Practitioner

## 2019-01-21 ENCOUNTER — Other Ambulatory Visit: Payer: Self-pay

## 2019-01-21 DIAGNOSIS — J441 Chronic obstructive pulmonary disease with (acute) exacerbation: Secondary | ICD-10-CM

## 2019-01-21 MED ORDER — PREDNISONE 10 MG PO TABS: ORAL_TABLET | ORAL | 0 refills | Status: DC

## 2019-01-21 MED ORDER — DOXYCYCLINE HYCLATE 100 MG PO TABS: 100.0000 mg | ORAL_TABLET | Freq: Two times a day (BID) | ORAL | 0 refills | Status: DC

## 2019-01-21 NOTE — Telephone Encounter (Signed)
If patient is feeling significantly worse please go to the ED at [facility]. They will test her if they feel it is indicated. She is at high risk for Covid being that she is taking chemotherapy and has COPD. If she is stable please stay home and self isolate and take medications that I ordered. Thanks.

## 2019-01-21 NOTE — Patient Instructions (Addendum)
Will order prednisone taper Will order doxycycline Continue stiolto Continue albuterol as needed  Follow up: Follow up in 2 weeks tele-visit with NP If symptoms worsen please go to the ED

## 2019-01-21 NOTE — Telephone Encounter (Signed)
Called and given below message to [place]. She verbalized understanding. She will call pulmonologist. [name] is taking care of the tube for the thoracentesis. She drained on Saturday and will drain tube tomorrow.

## 2019-01-21 NOTE — Assessment & Plan Note (Signed)
Discussion:  Patient states that this week she has had increased shortness of breath, wheezing, and chills.  She underwent chemo on Monday.  She took her temperature while on the phone with me today and she does have a low-grade fever of 99.0 F.  Her Pleurx drain was drained on this past Saturday with 175 cc drained.  She is due for it to be drained again tomorrow.  She states that she has been compliant with her Stiolto and has been using albuterol as needed. Patient states that she has been self isolating. I will treat for COPD exacerbation and will give antibiotic.   Patient Instructions  Will order prednisone taper Will order doxycycline Continue stiolto Continue albuterol as needed  Follow up: Follow up in 2 weeks tele-visit with NP If symptoms worsen please go to the ED

## 2019-01-21 NOTE — Telephone Encounter (Signed)
[name] called for her Mom.  After chemo on Monday, when she got home she was SOB and having chills/ not feeling well. Tuesday she felt better. Today she is SOB and using oxygen. Denies fever, cough and chills. A few weeks ago she took prednisone for a COPD exacerbation and felt better after a week.

## 2019-01-21 NOTE — Progress Notes (Signed)
Virtual Visit via Telephone Note  I connected with [name] on 01/21/19 at 11:30 AM EDT by telephone and verified that I am speaking with the correct person using two identifiers.   I discussed the limitations, risks, security and privacy concerns of performing an evaluation and management service by telephone and the availability of in person appointments. I also discussed with the patient that there may be a patient responsible charge related to this service. The patient expressed understanding and agreed to proceed.   History of Present Illness: 85 year old female with COPD, psoriatic arthritis, chronic rhinitis, and chronic cough.  Followed recently after thoracentesis for pleural effusion-patient has Pleurx drain.  Patient is followed by Dr. Lamonte Sakai.  Patient has a tele-visit today for an acute visit.  She states that this week she has had increased shortness of breath, wheezing, and chills.  She underwent chemo on Monday.  She took her temperature while on the phone with me today and she does have a low-grade fever of 99.0 F.  Her Pleurx drain was drained on this past Saturday with 175 cc drained.  She is due for it to be drained again tomorrow.  She states that she has been compliant with her Stiolto and has been using albuterol as needed. Denies n/v/d, hemoptysis, PND, leg swelling.    Observations/Objective:  CXR 12/11/18 - COPD.  Bibasilar scarring. Right PleurX catheter and Port-A-Cath unchanged. No visible pneumothorax.  Assessment and Plan: Discussion:  Patient states that this week she has had increased shortness of breath, wheezing, and chills.  She underwent chemo on Monday.  She took her temperature while on the phone with me today and she does have a low-grade fever of 99.0 F.  Her Pleurx drain was drained on this past Saturday with 175 cc drained.  She is due for it to be drained again tomorrow.  She states that she has been compliant with her Stiolto and has been using  albuterol as needed. Patient states that she has been self isolating. I will treat for COPD exacerbation and will give antibiotic.   Patient Instructions  Will order prednisone taper Will order doxycycline Continue stiolto Continue albuterol as needed    Follow Up Instructions:  Follow up in 2 weeks tele-visit with NP If symptoms worsen please go to the ED    I discussed the assessment and treatment plan with the patient. The patient was provided an opportunity to ask questions and all were answered. The patient agreed with the plan and demonstrated an understanding of the instructions.   The patient was advised to call back or seek an in-person evaluation if the symptoms worsen or if the condition fails to improve as anticipated.  I provided 22 minutes of non-face-to-face time during this encounter.   Fenton Foy, NP

## 2019-01-21 NOTE — Telephone Encounter (Signed)
She needs to call her pulmonologist for assessment I cannot call in prednisone without knowing what we are dealing with Is [facility] still helping with thoracentesis?

## 2019-01-21 NOTE — Telephone Encounter (Signed)
Call returned to patient daughter, she states her mother is now has a fever of 101 (oral), she has taken some tylenol and her temp is now T 99.8 (auxillary). She states she typically runs 97.9. She has only taken one dose of the antibiotic. Her daughter is wanting to know should she be tested for Covid-19. She states she is now feeling hot. She states during her tele-visit she was told to call back if she develops fever  [place] please advise.

## 2019-01-21 NOTE — Telephone Encounter (Signed)
Called and spoke with pt's daughter [name] stating to her the info per [place]. [name] expressed understanding. Nothing further needed.

## 2019-01-26 ENCOUNTER — Telehealth: Payer: Self-pay | Admitting: Cardiology

## 2019-01-26 NOTE — Telephone Encounter (Signed)
Patient felt palpitations in her chest this am, she had not taken her diltiazem yet. Now her HR is in the 70's and she feels fine.I told her to call back if things  Changed, she agrees

## 2019-01-26 NOTE — Telephone Encounter (Signed)
Patient states that HR is in 40's and states that she "doesn't feel right in chest". Please advise. / tg

## 2019-01-27 ENCOUNTER — Other Ambulatory Visit: Payer: Self-pay | Admitting: Hematology and Oncology

## 2019-02-02 ENCOUNTER — Inpatient Hospital Stay: Payer: Medicare Other

## 2019-02-02 ENCOUNTER — Inpatient Hospital Stay (HOSPITAL_BASED_OUTPATIENT_CLINIC_OR_DEPARTMENT_OTHER): Payer: Medicare Other | Admitting: Hematology and Oncology

## 2019-02-02 ENCOUNTER — Encounter: Payer: Self-pay | Admitting: Hematology and Oncology

## 2019-02-02 ENCOUNTER — Other Ambulatory Visit: Payer: Self-pay

## 2019-02-02 ENCOUNTER — Other Ambulatory Visit: Payer: Self-pay | Admitting: Emergency Medicine

## 2019-02-02 DIAGNOSIS — C562 Malignant neoplasm of left ovary: Secondary | ICD-10-CM

## 2019-02-02 DIAGNOSIS — I700 Atherosclerosis of aorta: Secondary | ICD-10-CM

## 2019-02-02 DIAGNOSIS — D638 Anemia in other chronic diseases classified elsewhere: Secondary | ICD-10-CM

## 2019-02-02 DIAGNOSIS — J90 Pleural effusion, not elsewhere classified: Secondary | ICD-10-CM

## 2019-02-02 DIAGNOSIS — K862 Cyst of pancreas: Secondary | ICD-10-CM

## 2019-02-02 DIAGNOSIS — G893 Neoplasm related pain (acute) (chronic): Secondary | ICD-10-CM

## 2019-02-02 DIAGNOSIS — D61818 Other pancytopenia: Secondary | ICD-10-CM | POA: Diagnosis not present

## 2019-02-02 DIAGNOSIS — K5900 Constipation, unspecified: Secondary | ICD-10-CM

## 2019-02-02 DIAGNOSIS — Z90722 Acquired absence of ovaries, bilateral: Secondary | ICD-10-CM

## 2019-02-02 DIAGNOSIS — Z79899 Other long term (current) drug therapy: Secondary | ICD-10-CM

## 2019-02-02 DIAGNOSIS — Z90710 Acquired absence of both cervix and uterus: Secondary | ICD-10-CM

## 2019-02-02 DIAGNOSIS — C786 Secondary malignant neoplasm of retroperitoneum and peritoneum: Secondary | ICD-10-CM

## 2019-02-02 DIAGNOSIS — N183 Chronic kidney disease, stage 3 unspecified: Secondary | ICD-10-CM

## 2019-02-02 DIAGNOSIS — Z5111 Encounter for antineoplastic chemotherapy: Secondary | ICD-10-CM | POA: Diagnosis not present

## 2019-02-02 DIAGNOSIS — R971 Elevated cancer antigen 125 [CA 125]: Secondary | ICD-10-CM

## 2019-02-02 LAB — COMPREHENSIVE METABOLIC PANEL
ALT: 10 U/L (ref 0–44)
AST: 15 U/L (ref 15–41)
Albumin: 2.7 g/dL — ABNORMAL LOW (ref 3.5–5.0)
Alkaline Phosphatase: 68 U/L (ref 38–126)
Anion gap: 9 (ref 5–15)
BUN: 17 mg/dL (ref 8–23)
CO2: 28 mmol/L (ref 22–32)
Calcium: 8.3 mg/dL — ABNORMAL LOW (ref 8.9–10.3)
Chloride: 97 mmol/L — ABNORMAL LOW (ref 98–111)
Creatinine, Ser: 1.14 mg/dL — ABNORMAL HIGH (ref 0.44–1.00)
GFR calc Af Amer: 51 mL/min — ABNORMAL LOW (ref >60)
GFR calc non Af Amer: 44 mL/min — ABNORMAL LOW (ref >60)
Glucose, Bld: 101 mg/dL — ABNORMAL HIGH (ref 70–99)
Potassium: 4.1 mmol/L (ref 3.5–5.1)
Sodium: 134 mmol/L — ABNORMAL LOW (ref 135–145)
Total Bilirubin: 0.3 mg/dL (ref 0.3–1.2)
Total Protein: 6.9 g/dL (ref 6.5–8.1)

## 2019-02-02 LAB — CBC WITH DIFFERENTIAL/PLATELET
Abs Immature Granulocytes: 0.45 10*3/uL — ABNORMAL HIGH (ref 0.00–0.07)
Basophils Absolute: 0.1 10*3/uL (ref 0.0–0.1)
Basophils Relative: 0 %
Eosinophils Absolute: 0.1 10*3/uL (ref 0.0–0.5)
Eosinophils Relative: 1 %
HCT: 28.2 % — ABNORMAL LOW (ref 36.0–46.0)
Hemoglobin: 8.5 g/dL — ABNORMAL LOW (ref 12.0–15.0)
Immature Granulocytes: 4 %
Lymphocytes Relative: 9 %
Lymphs Abs: 1.2 10*3/uL (ref 0.7–4.0)
MCH: 25.1 pg — ABNORMAL LOW (ref 26.0–34.0)
MCHC: 30.1 g/dL (ref 30.0–36.0)
MCV: 83.4 fL (ref 80.0–100.0)
Monocytes Absolute: 1.6 10*3/uL — ABNORMAL HIGH (ref 0.1–1.0)
Monocytes Relative: 12 %
Neutro Abs: 9.6 10*3/uL — ABNORMAL HIGH (ref 1.7–7.7)
Neutrophils Relative %: 74 %
Platelets: 273 10*3/uL (ref 150–400)
RBC: 3.38 MIL/uL — ABNORMAL LOW (ref 3.87–5.11)
RDW: 17.6 % — ABNORMAL HIGH (ref 11.5–15.5)
WBC: 12.9 10*3/uL — ABNORMAL HIGH (ref 4.0–10.5)
nRBC: 0 % (ref 0.0–0.2)

## 2019-02-02 LAB — SAMPLE TO BLOOD BANK

## 2019-02-02 MED ORDER — DIPHENHYDRAMINE HCL 25 MG PO TABS
25.0000 mg | ORAL_TABLET | Freq: Once | ORAL | Status: AC
  Administered 2019-02-02: 12:00:00 25 mg via ORAL
  Filled 2019-02-02: qty 1

## 2019-02-02 MED ORDER — SODIUM CHLORIDE 0.9% FLUSH
10.0000 mL | INTRAVENOUS | Status: DC | PRN
  Administered 2019-02-02: 10 mL
  Filled 2019-02-02: qty 10

## 2019-02-02 MED ORDER — FAMOTIDINE IN NACL 20-0.9 MG/50ML-% IV SOLN
INTRAVENOUS | Status: AC
  Filled 2019-02-02: qty 50

## 2019-02-02 MED ORDER — SODIUM CHLORIDE 0.9 % IV SOLN
400.0000 mg/m2 | Freq: Once | INTRAVENOUS | Status: AC
  Administered 2019-02-02: 14:00:00 760 mg via INTRAVENOUS
  Filled 2019-02-02: qty 19.99

## 2019-02-02 MED ORDER — SODIUM CHLORIDE 0.9% FLUSH
10.0000 mL | Freq: Once | INTRAVENOUS | Status: AC
  Administered 2019-02-02: 10 mL
  Filled 2019-02-02: qty 10

## 2019-02-02 MED ORDER — HEPARIN SOD (PORK) LOCK FLUSH 100 UNIT/ML IV SOLN
500.0000 [IU] | Freq: Once | INTRAVENOUS | Status: AC | PRN
  Administered 2019-02-02: 15:00:00 500 [IU]
  Filled 2019-02-02: qty 5

## 2019-02-02 MED ORDER — DIPHENHYDRAMINE HCL 25 MG PO CAPS
ORAL_CAPSULE | ORAL | Status: AC
  Filled 2019-02-02: qty 1

## 2019-02-02 MED ORDER — PALONOSETRON HCL INJECTION 0.25 MG/5ML
0.2500 mg | Freq: Once | INTRAVENOUS | Status: AC
  Administered 2019-02-02: 12:00:00 0.25 mg via INTRAVENOUS

## 2019-02-02 MED ORDER — SODIUM CHLORIDE 0.9 % IV SOLN
Freq: Once | INTRAVENOUS | Status: AC
  Administered 2019-02-02: 12:00:00 via INTRAVENOUS
  Filled 2019-02-02: qty 250

## 2019-02-02 MED ORDER — FAMOTIDINE IN NACL 20-0.9 MG/50ML-% IV SOLN
20.0000 mg | Freq: Once | INTRAVENOUS | Status: AC
  Administered 2019-02-02: 12:00:00 20 mg via INTRAVENOUS

## 2019-02-02 MED ORDER — SODIUM CHLORIDE 0.9 % IV SOLN
Freq: Once | INTRAVENOUS | Status: AC
  Administered 2019-02-02: 13:00:00 via INTRAVENOUS
  Filled 2019-02-02: qty 5

## 2019-02-02 MED ORDER — SODIUM CHLORIDE 0.9 % IV SOLN
150.0000 mg | Freq: Once | INTRAVENOUS | Status: AC
  Administered 2019-02-02: 15:00:00 150 mg via INTRAVENOUS
  Filled 2019-02-02: qty 15

## 2019-02-02 MED ORDER — PALONOSETRON HCL INJECTION 0.25 MG/5ML
INTRAVENOUS | Status: AC
  Filled 2019-02-02: qty 5

## 2019-02-02 NOTE — Progress Notes (Signed)
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[place]) Overall, she has no signs or symptoms of progression of disease Her recent tumor marker was elevated but that could be inaccurate in the presence of pleural effusion We will look at the tumor marker again tomorrow If her result is worse, we might consider repeat imaging study soon for objective assessment of response to therapy Today, she will proceed with treatment as scheduled  Pancytopenia, acquired ([place]) She has received blood transfusion recently She denies signs of bleeding She will continue her treatment as scheduled and transfusion as needed, to keep hemoglobin greater than 8 She will continue on reduced dose therapy.  CKD (chronic kidney disease) stage 3, GFR 30-59 ml/min (HCC) She has stable chronic kidney disease We will monitor carefully while she is on treatment  Pleural effusion She has persistent, recurrent pleural effusion requiring intermittent thoracentesis The patient does not understand why the tube cannot be removed I tried to explain to her, in the presence of active malignancy, it is likely that she will likely has recurrent pleural effusion requiring intermittent thoracentesis  Cancer associated pain She will continue taking her pain medicine as prescribed I recommend her to continue on pain medication along with Tylenol as needed   No orders of the defined types were placed in this encounter.   INTERVAL HISTORY: Please see below for problem oriented charting. She returns for chemotherapy and follow-up She had recent  shortness of breath that resolved with antibiotics, prednisone and intermittent thoracentesis She denies bloating, changes in bowel habits or nausea Her chronic pain is stable with current prescription oxycodone as needed She has intermittent neck discomfort that comes and goes Denies recent fever or chills The patient denies any recent signs or symptoms of bleeding such as spontaneous epistaxis, hematuria or hematochezia.  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 Interim finding of moderate ascites within the abdomen and pelvis with additional finding of diffuse nodular infiltration of the omentum and anterior mesenteric fat, the appearance would be consistent with peritoneal carcinomatosis/metastatic disease. Increasing retroperitoneal and upper abdominal adenopathy. 2. Re- demonstrated 3.8 cm cyst in the right adnexa. Enlarging soft tissue density in the left adnexa now with possible cystic component posteriorly. In light of the above findings, concern is for ovarian neoplasm. Correlation with pelvic ultrasound recommended. 3. Small right-sided pleural effusion, new since prior study 4. Stable hypodense splenic lesions since 2017.     06/25/2017 Imaging    US pelvis: 2.9 cm simple appearing cyst in the right ovary. Left ovary grossly unremarkable.  Large volume ascites in the pelvis    06/30/2017 - 07/01/2017 Hospital Admission    She was admitted for evaluation of abdominal  pain and ascites    07/01/2017 Pathology Results    PERITONEAL/ASCITIC FLUID(SPECIMEN 1 OF 1 COLLECTED 07/01/17): - POORLY DIFFERENTIATED CARCINOMA; SEE COMMENT Source Peritoneal/Ascitic Fluid, (specimen 1 of 1 collected 07/01/17) Gross Specimen: Received is/are 1000 cc's of brownish fluid. (BS:bs) Prepared: # Smears: 0 # Concentration Technique Slides (i.e. ThinPrep): 1 # Cell Block: 1 Additional Studies: Also received Hematology slide - [id number]. Comment The tumor cells are positive for cytokeratin 7 and Pax-8 but negative for cytokeratin 20, CDX-2, GATA-3, Napsin-A and TTF-1. Based on the immunoprofile a gynecology primary is favored    07/01/2017 Procedure    Successful ultrasound-guided diagnostic and therapeutic paracentesis yielding 2.5 liters of peritoneal fluid    07/07/2017 - 07/09/2017 Hospital Admission    She was admitted for management of malignant ascites    07/08/2017 Procedure    Successful ultrasound-guided therapeutic paracentesis yielding 2.7 liters liters of peritoneal fluid    07/12/2017 Procedure    Successful ultrasound-guided paracentesis yielding 1450 mL of peritoneal fluid    07/18/2017 - 07/24/2017 Hospital Admission    She was admitted for expedited treatment    07/18/2017 Tumor Marker    Patient's tumor was tested for the following markers: CA125 Results of the tumor marker test revealed 1941    07/19/2017 - 02/04/2018 Chemotherapy    The patient had 6 cycles of carboplatin & Taxol for chemotherapy treatment, followed by 3 more cycles of carboplatin only     07/19/2017 - 02/04/2018 Chemotherapy    The patient had carboplatin and taxol    08/06/2017 Procedure    Successful ultrasound-guided therapeutic paracentesis yielding 2.6 liters of peritoneal fluid.    08/09/2017 Tumor Marker    Patient's tumor was tested for the following markers: CA125 Results of the tumor marker test revealed 1665    08/15/2017 Tumor Marker    Patient's tumor was tested for  the following markers: CA125 Results of the tumor marker test revealed 937.9    08/20/2017 Imaging    ECHO: Normal LV size with EF 60-65%. Normal RV size and systolic function. No significant valvular abnormalities.    09/18/2017 Imaging    Chest Impression:  1. No evidence thoracic metastasis. 2. Interval increase and RIGHT pleural effusion.  Abdomen / Pelvis Impression:  1. Interval decrease in intraperitoneal free fluid. 2. Interval decrease in omental nodularity in the LEFT ventral peritoneal space. 3. Interval decrease in nodularity associated with the LEFT ovary. 4. Cystic portion of the RIGHT ovary is increased mildly in size.    09/20/2017 Tumor Marker    Patient's tumor was tested for the following markers: CA125 Results of the tumor marker test revealed 347    10/14/2017 Tumor Marker    Patient's tumor was tested for the following markers: CA125 Results of the tumor marker test revealed 307.4    11/04/2017 Tumor Marker    Patient's tumor was tested for the following markers: CA125 Results of the tumor marker test revealed 262.5    11/28/2017 Imaging    1. Interval decrease in right pleural effusion with resolution of right atelectasis seen previously. 2. New small left pleural effusion, symmetric to the right. 3. No intraperitoneal free fluid on the current study. 4. Continued further decrease in left omental disease, appearing less confluent today than on the prior study. 5. Left ovary remains normal in appearance today and the right  adnexal cystic lesion is decreased in size compared to prior study. 6. 14 mm pancreatic cyst is unchanged. Continued attention on follow-up imaging recommended. 7. Aortic Atherosclerois (ICD10-170.0)    12/13/2017 Tumor Marker    Patient's tumor was tested for the following markers: CA125 Results of the tumor marker test revealed 197.7    01/03/2018 Tumor Marker    Patient's tumor was tested for the following markers: CA125 Results of  the tumor marker test revealed 183.1    01/14/2018 Tumor Marker    Patient's tumor was tested for the following markers: CA125 Results of the tumor marker test revealed 177.4    02/04/2018 Tumor Marker    Patient's tumor was tested for the following markers: CA125 Results of the tumor marker test revealed 168.5    02/25/2018 Imaging    1. Omental carcinomatosis appears qualitatively stable to slightly decreased. Stable mild peritoneal thickening in the paracolic gutters. Stable right adnexal cyst. No ascites. No new or progressive metastatic disease in the abdomen or pelvis. 2. Small dependent right pleural effusion is increased. 3. Cystic pancreatic body lesion is decreased and now subcentimeter in size, suggesting a benign lesion. 4. Aortic Atherosclerosis (ICD10-I70.0).    03/03/2018 - 03/07/2018 Hospital Admission    She was hospitalized for GI bleed requiring blood transfusions. Xarelto was placed on hold    03/07/2018 PET scan    1. Persistent hazy omental interstitial nodularity but no hypermetabolism or discrete measurable nodules. No abdominal ascites. 2. No findings for metastatic disease involving the chest. 3. Moderate-sized right pleural effusion and small left pleural effusion.     03/20/2018 Pathology Results    1. Ovary and fallopian tube, right - OVARY AND FALLOPIAN TUBE INVOLVED BY SEROUS CARCINOMA. - PARATUBAL CYST. 2. Uterus +/- tubes/ovaries, neoplastic, cervix, left ovary and fallopian tube - LEFT OVARY: HIGH GRADE SEROUS CARCINOMA WITH TREATMENT EFFECT, SPANNING 2.5 CM. CARCINOMA INVOLVES OVARIAN SURFACE. SEE ONCOLOGY TABLE. - LEFT FALLOPIAN TUBE: INVOLVED BY SEROUS CARCINOMA. - UTERUS: -ENDOMETRIUM: INACTIVE ENDOMETRIUM. NO HYPERPLASIA OR MALIGNANCY. -MYOMETRIUM: UNREMARKABLE. NO MALIGNANCY. -SEROSA: INVOLVED BY SEROUS CARCINOMA. - CERVIX: ENDOCERVICAL POLYP. NO MALIGNANCY. 3. Omentum, resection for tumor - INVOLVED BY SEROUS CARCINOMA. 4. Soft tissue,  biopsy, mesenteric nodule - INVOLVED BY SEROUS CARCINOMA. Microscopic Comment 2. OVARY or FALLOPIAN TUBE or PRIMARY PERITONEUM: Procedure: Total hysterectomy and bilateral salpingo-oophorectomy. Omentectomy. Mesenteric lymph node biopsy. Specimen Integrity: Intact. Tumor Site: Left ovary. Ovarian Surface Involvement (required only if applicable): Present. Fallopian Tube Surface Involvement (required only if applicable): Present, bilateral. Tumor Size: 2.5 cm. Histologic Type: High grade serous carcinoma. Histologic Grade: High grade. Implants (required for advanced stage serous/seromucinous borderline tumors only): N/A. Other Tissue/ Organ Involvement: Bilateral fallopian tubes, right ovary, uterine serosa, omentum. Largest Extrapelvic Peritoneal Focus (required only if applicable): Microscopic, estimated 0.5 cm (omentum). Peritoneal/Ascitic Fluid: Prior Positive ([id number]). Treatment Effect (required only for high-grade serous carcinomas): Present in left ovary. CRS2. Regional Lymph Nodes: No lymph nodes submitted/identified. Pathologic Stage Classification (pTNM, AJCC 8th Edition): ypT3b, ypNX Representative Tumor Block: 1A, 1B, 39F, 25F. Comment(s): The right ovary has only surface deposits with a large paratubal cyst. The left ovary has intraparenchymal tumor with associated treatment effect. Thus the tumor location is classified as a left ovarian primary.    03/20/2018 Surgery    Procedure(s) Performed:  1. Exploratory laparotomy with total hysterectomy and bilateral salpingo-oophorectomy 2. Infragastic Omentectomy  3. Debulking to <1cm gross residual disease   Surgeon: Mart Piggs, MD  Specimens: Uterus Cervix, Bilateral tubes /  ovaries and omentum. Mesenteric nodule.  Operative Findings: Debulked to gross residual disease <1cm; however there is miliary disease in multiple locations including the majority of the abdominal peritoneum (anterior abdominal wall,  bilateral gutters), diaphragm (Right>left), majority of small bowel mesentary. Normal appendix. Normal small uterus. Right ovary with a cystic lesion ~3cm, some adhesive disease of right adnexa to rectum/sigmoid. Gross omental disease, which was resected with the omentectomy. Smooth liver surface, but again, diaphragmatic disease noted.       03/20/2018 Genetic Testing    Patient has genetic testing done for ER/PR. Results revealed patient has ER: 90%, PR 0%.     03/31/2018 Tumor Marker    Patient's tumor was tested for the following markers: CA125 Results of the tumor marker test revealed 215.8     Genetic Testing    Patient has genetic testing done for BRCA 1. Results revealed patient has the following: BRCA 1: no loss of expression.     Genetic Testing    Patient has genetic testing done for MMR . Results revealed patient has the following:  MMR: normal    03/31/2018 Genetic Testing    Patient has genetic testing done for BRCA1/2. Results revealed patient has no actionable mutations. She is found to have [place] genetic change of unknown significance    04/21/2018 Imaging    1. No definite findings of residual or recurrent metastatic disease in the abdomen or pelvis status post interval TAHBSO and omentectomy. Stable minimal thickening in the paracolic gutters without discrete peritoneal nodularity. 2. Trace free fluid in the pelvic cul-de-sac. 3. Stable small dependent right pleural effusion. 4. Subcentimeter pancreatic body cystic lesion is stable to slightly decreased. 5. Aortic Atherosclerosis (ICD10-I70.0).    04/21/2018 Tumor Marker    Patient's tumor was tested for the following markers: CA125 Results of the tumor marker test revealed 187.3    04/23/2018 - 09/12/2018 Anti-estrogen oral therapy    She is placed on Femara    05/08/2018 Procedure    Successful ultrasound guided right thoracentesis yielding 800 mL of pleural fluid. Fluid cytology is negative for  malignancy     05/28/2018 Tumor Marker    Patient's tumor was tested for the following markers: CA125 Results of the tumor marker test revealed 190    06/30/2018 Tumor Marker    Patient's tumor was tested for the following markers: CA125 Results of the tumor marker test revealed 148    07/23/2018 Imaging    Status post hysterectomy and bilateral salpingo-oophorectomy.  Very mild peritoneal thickening/nodularity, equivocal but worrisome for very mild peritoneal disease. Attention on follow-up is suggested.  Small right pleural effusion with indwelling pleural drain.    07/23/2018 Tumor Marker    Patient's tumor was tested for the following markers: CA125 Results of the tumor marker test revealed 215    09/11/2018 Tumor Marker    Patient's tumor was tested for the following markers: CA-125. Results of the tumor marker test revealed 1323    09/12/2018 Imaging    1. Mildly progressive ascites with some mild degree of nodularity most appreciable along the left adnexa, right paracolic gutter, right upper quadrant, compatible with peritoneal spread of tumor. 2. Small right pleural effusion with indwelling right pleural catheter again noted. Mild increase in atelectasis anteriorly at the right lung base. 3. Aortic Atherosclerosis (ICD10-I70.0). Coronary atherosclerosis. 4. Several tiny hypodense lesions in the spleen are technically nonspecific, but stable. 5. Air fluid level in the rectum compatible with diarrheal process. 6. Prominent bilateral hip arthropathy.  Left hip screw noted.    09/22/2018 -  Chemotherapy    The patient had carboplatin and gemzar    09/29/2018 Tumor Marker    Patient's tumor was tested for the following markers: CA125 Results of the tumor marker test revealed 1219    09/29/2018 Adverse Reaction    Cycle 1 day 8 of Gemzar was omitted due to severe anemia    10/20/2018 Tumor Marker    Patient's tumor was tested for the following markers: CA125 Results of  the tumor marker test revealed 402    11/13/2018 Tumor Marker    Patient's tumor was tested for the following markers: CA125 Results of the tumor marker test revealed 437    11/26/2018 Imaging    1. Mild residual peritoneal thickening, overall decreased from 09/11/2018, with resolution of previously seen ascites. 2. Tiny residual right pleural effusion with small bore chest tube in place. 3. Supraumbilical midline ventral hernia has a wide neck and contains unobstructed colon. 4.  Aortic atherosclerosis (ICD10-170.0).     01/05/2019 Tumor Marker    Patient's tumor was tested for the following markers: CA125 Results of the tumor marker test revealed 929     REVIEW OF SYSTEMS:   Constitutional: Denies fevers, chills or abnormal weight loss Eyes: Denies blurriness of vision Ears, nose, mouth, throat, and face: Denies mucositis or sore throat Cardiovascular: Denies palpitation, chest discomfort or lower extremity swelling Gastrointestinal:  Denies nausea, heartburn or change in bowel habits Skin: Denies abnormal skin rashes Lymphatics: Denies new lymphadenopathy or easy bruising Neurological:Denies numbness, tingling or new weaknesses Behavioral/Psych: Mood is stable, no new changes  All other systems were reviewed with the patient and are negative.  I have reviewed the past medical history, past surgical history, social history and family history with the patient and they are unchanged from previous note.  ALLERGIES:  is allergic to codeine.  MEDICATIONS:  Current Outpatient Medications  Medication Sig Dispense Refill  . albuterol (PROAIR HFA) 108 (90 Base) MCG/ACT inhaler Inhale 2 puffs into the lungs every 6 (six) hours as needed for wheezing or shortness of breath. 1 Inhaler 3  . ALPRAZolam (XANAX) 0.25 MG tablet Take 1 tablet (0.25 mg total) by mouth at bedtime as needed for anxiety. 5 tablet 0  . amoxicillin (AMOXIL) 500 MG capsule TAKE 4 CAPSULES PO 1 HOUR PRIOR TO DENTAL  APPOINTMENT  1  . azelastine (ASTELIN) 0.1 % nasal spray Place 2 sprays into both nostrils 2 (two) times daily as needed for allergies.     Marland Kitchen CARTIA XT 120 MG 24 hr capsule     . Cholecalciferol (VITAMIN D) 2000 units CAPS Take 2,000 Units by mouth daily.     Marland Kitchen dicyclomine (BENTYL) 10 MG capsule Take 1 capsule (10 mg total) by mouth 2 (two) times daily. 60 capsule 1  . diltiazem (DILACOR XR) 180 MG 24 hr capsule Take 180 mg by mouth every morning.    . diltiazem (TIAZAC) 120 MG 24 hr capsule Take 120 mg by mouth daily. At night time    . diphenhydrAMINE (BENADRYL) 25 mg capsule Take 25 mg by mouth daily as needed for itching.     . diphenoxylate-atropine (LOMOTIL) 2.5-0.025 MG tablet Take 1 tablet by mouth 4 (four) times daily. (Patient taking differently: Take 1 tablet by mouth 4 (four) times daily as needed for diarrhea or loose stools. ) 360 tablet 1  . DULoxetine (CYMBALTA) 60 MG capsule TAKE 1 CAPSULE(60 MG) BY MOUTH DAILY 30 capsule 3  .  fluticasone (FLONASE) 50 MCG/ACT nasal spray Place 2 sprays into both nostrils 2 (two) times daily as needed (FOR NASAL CONGESTION.).     Marland Kitchen furosemide (LASIX) 40 MG tablet Take 1 tablet (40 mg total) by mouth daily. 90 tablet 3  . halobetasol (ULTRAVATE) 0.05 % cream Apply 1 application topically 2 (two) times daily as needed (psoriasis).     Marland Kitchen levothyroxine (SYNTHROID, LEVOTHROID) 137 MCG tablet Take 137 mcg by mouth daily before breakfast. For thyroid therapy    . Magnesium 400 MG CAPS Take 400 mg by mouth daily.    . mirtazapine (REMERON) 15 MG tablet TAKE 1 TABLET(15 MG) BY MOUTH AT BEDTIME (Patient taking differently: Take 15 mg by mouth at bedtime. ) 30 tablet 11  . Oxycodone HCl 10 MG TABS Take 1 tablet (10 mg total) by mouth every 6 (six) hours as needed. 60 tablet 0  . polyvinyl alcohol (LIQUIFILM TEARS) 1.4 % ophthalmic solution Place 1 drop into both eyes 2 (two) times daily as needed for dry eyes.     . potassium chloride SA (K-DUR,KLOR-CON) 20  MEQ tablet TAKE 1 TABLET BY MOUTH DAILY AND TAKE 1 ADDITIONAL TABLET WHEN TAKING EXTRA LASIX DOSE 60 tablet 6  . STIOLTO RESPIMAT 2.5-2.5 MCG/ACT AERS INHALE 2 PUFFS INTO THE LUNGS DAILY 1 g 3  . XARELTO 20 MG TABS tablet TAKE 1 TABLET BY MOUTH EVERY DAY AFTER SUPPER 90 tablet 3   No current facility-administered medications for this visit.    Facility-Administered Medications Ordered in Other Visits  Medication Dose Route Frequency Provider Last Rate Last Dose  . 0.9 %  sodium chloride infusion (Manually program via Guardrails IV Fluids)  250 mL Intravenous Once Heath Lark, MD   Stopped at 12/16/18 1500  . 0.9 %  sodium chloride infusion   Intravenous Once Heath Lark, MD   Stopped at 12/16/18 1521  . CARBOplatin (PARAPLATIN) 150 mg in sodium chloride 0.9 % 250 mL chemo infusion  150 mg Intravenous Once Alvy Bimler, Vandy Fong, MD      . gemcitabine (GEMZAR) 760 mg in sodium chloride 0.9 % 250 mL chemo infusion  400 mg/m2 (Treatment Plan Recorded) Intravenous Once Alvy Bimler, Jillayne Witte, MD      . heparin lock flush 100 unit/mL  500 Units Intracatheter Once PRN Alvy Bimler, Elener Custodio, MD      . sodium chloride flush (NS) 0.9 % injection 10 mL  10 mL Intracatheter PRN Alvy Bimler, Myrka Sylva, MD   10 mL at 12/16/18 1451  . sodium chloride flush (NS) 0.9 % injection 10 mL  10 mL Intracatheter PRN Alvy Bimler, Marjoria Mancillas, MD        PHYSICAL EXAMINATION: ECOG PERFORMANCE STATUS: 2 - Symptomatic, <50% confined to bed  Vitals:   02/02/19 1122  BP: (!) 129/51  Pulse: 76  Resp: 18  Temp: 98.8 F (37.1 C)  SpO2: 99%   Filed Weights   02/02/19 1122  Weight: 182 lb 11.2 oz (82.9 kg)    GENERAL:alert, no distress and comfortable SKIN: skin color, texture, turgor are normal, no rashes or significant lesions EYES: normal, Conjunctiva are pink and non-injected, sclera clear OROPHARYNX:no exudate, no erythema and lips, buccal mucosa, and tongue normal  NECK: supple, thyroid normal size, non-tender, without nodularity LYMPH:  no palpable  lymphadenopathy in the cervical, axillary or inguinal LUNGS: clear to auscultation and percussion with normal breathing effort HEART: regular rate & rhythm and no murmurs and no lower extremity edema ABDOMEN:abdomen soft, non-tender and normal bowel sounds Musculoskeletal:no cyanosis of digits and  no clubbing  NEURO: alert & oriented x 3 with fluent speech, no focal motor/sensory deficits  LABORATORY DATA:  I have reviewed the data as listed    Component Value Date/Time   NA 134 (L) 02/02/2019 1015   NA 135 (L) 09/24/2017 0945   K 4.1 02/02/2019 1015   K 3.7 09/24/2017 0945   CL 97 (L) 02/02/2019 1015   CO2 28 02/02/2019 1015   CO2 30 (H) 09/24/2017 0945   GLUCOSE 101 (H) 02/02/2019 1015   GLUCOSE 101 09/24/2017 0945   BUN 17 02/02/2019 1015   BUN 15.3 09/24/2017 0945   CREATININE 1.14 (H) 02/02/2019 1015   CREATININE 1.22 (H) 12/29/2018 0900   CREATININE 0.8 09/24/2017 0945   CALCIUM 8.3 (L) 02/02/2019 1015   CALCIUM 9.1 09/24/2017 0945   PROT 6.9 02/02/2019 1015   PROT 6.6 09/24/2017 0945   ALBUMIN 2.7 (L) 02/02/2019 1015   ALBUMIN 3.3 (L) 09/24/2017 0945   AST 15 02/02/2019 1015   AST 26 12/29/2018 0900   AST 16 09/24/2017 0945   ALT 10 02/02/2019 1015   ALT 21 12/29/2018 0900   ALT 12 09/24/2017 0945   ALKPHOS 68 02/02/2019 1015   ALKPHOS 52 09/24/2017 0945   BILITOT 0.3 02/02/2019 1015   BILITOT 0.2 (L) 12/29/2018 0900   BILITOT 1.13 09/24/2017 0945   GFRNONAA 44 (L) 02/02/2019 1015   GFRNONAA 41 (L) 12/29/2018 0900   GFRAA 51 (L) 02/02/2019 1015   GFRAA 47 (L) 12/29/2018 0900    No results found for: SPEP, UPEP  Lab Results  Component Value Date   WBC 12.9 (H) 02/02/2019   NEUTROABS 9.6 (H) 02/02/2019   HGB 8.5 (L) 02/02/2019   HCT 28.2 (L) 02/02/2019   MCV 83.4 02/02/2019   PLT 273 02/02/2019      Chemistry      Component Value Date/Time   NA 134 (L) 02/02/2019 1015   NA 135 (L) 09/24/2017 0945   K 4.1 02/02/2019 1015   K 3.7 09/24/2017 0945    CL 97 (L) 02/02/2019 1015   CO2 28 02/02/2019 1015   CO2 30 (H) 09/24/2017 0945   BUN 17 02/02/2019 1015   BUN 15.3 09/24/2017 0945   CREATININE 1.14 (H) 02/02/2019 1015   CREATININE 1.22 (H) 12/29/2018 0900   CREATININE 0.8 09/24/2017 0945      Component Value Date/Time   CALCIUM 8.3 (L) 02/02/2019 1015   CALCIUM 9.1 09/24/2017 0945   ALKPHOS 68 02/02/2019 1015   ALKPHOS 52 09/24/2017 0945   AST 15 02/02/2019 1015   AST 26 12/29/2018 0900   AST 16 09/24/2017 0945   ALT 10 02/02/2019 1015   ALT 21 12/29/2018 0900   ALT 12 09/24/2017 0945   BILITOT 0.3 02/02/2019 1015   BILITOT 0.2 (L) 12/29/2018 0900   BILITOT 1.13 09/24/2017 0945      All questions were answered. The patient knows to call the clinic with any problems, questions or concerns. No barriers to learning was detected.  I spent 25 minutes counseling the patient face to face. The total time spent in the appointment was 30 minutes and more than 50% was on counseling and review of test results  Heath Lark, MD 02/02/2019 12:57 PM

## 2019-02-02 NOTE — Assessment & Plan Note (Signed)
Overall, she has no signs or symptoms of progression of disease Her recent tumor marker was elevated but that could be inaccurate in the presence of pleural effusion We will look at the tumor marker again tomorrow If her result is worse, we might consider repeat imaging study soon for objective assessment of response to therapy Today, she will proceed with treatment as scheduled

## 2019-02-02 NOTE — Assessment & Plan Note (Signed)
She has persistent, recurrent pleural effusion requiring intermittent thoracentesis The patient does not understand why the tube cannot be removed I tried to explain to her, in the presence of active malignancy, it is likely that she will likely has recurrent pleural effusion requiring intermittent thoracentesis

## 2019-02-02 NOTE — Assessment & Plan Note (Signed)
She has stable chronic kidney disease We will monitor carefully while she is on treatment

## 2019-02-02 NOTE — Assessment & Plan Note (Signed)
She has received blood transfusion recently She denies signs of bleeding She will continue her treatment as scheduled and transfusion as needed, to keep hemoglobin greater than 8 She will continue on reduced dose therapy.

## 2019-02-02 NOTE — Assessment & Plan Note (Signed)
She will continue taking her pain medicine as prescribed I recommend her to continue on pain medication along with Tylenol as needed

## 2019-02-02 NOTE — Patient Instructions (Signed)
[facility] Discharge Instructions for Patients Receiving Chemotherapy  Today you received the following chemotherapy agents:  Gemzar, Carboplatin  To help prevent nausea and vomiting after your treatment, we encourage you to take your nausea medication as prescribed.   If you develop nausea and vomiting that is not controlled by your nausea medication, call the clinic.   BELOW ARE SYMPTOMS THAT SHOULD BE REPORTED IMMEDIATELY:  *FEVER GREATER THAN 100.5 F  *CHILLS WITH OR WITHOUT FEVER  NAUSEA AND VOMITING THAT IS NOT CONTROLLED WITH YOUR NAUSEA MEDICATION  *UNUSUAL SHORTNESS OF BREATH  *UNUSUAL BRUISING OR BLEEDING  TENDERNESS IN MOUTH AND THROAT WITH OR WITHOUT PRESENCE OF ULCERS  *URINARY PROBLEMS  *BOWEL PROBLEMS  UNUSUAL RASH Items with * indicate a potential emergency and should be followed up as soon as possible.  Feel free to call the clinic should you have any questions or concerns. The clinic phone number is [phone].  Please show the CHEMO ALERT CARD at check-in to the Emergency Department and triage nurse.   

## 2019-02-03 ENCOUNTER — Telehealth: Payer: Self-pay | Admitting: Oncology

## 2019-02-03 ENCOUNTER — Other Ambulatory Visit: Payer: Self-pay | Admitting: Hematology and Oncology

## 2019-02-03 DIAGNOSIS — C562 Malignant neoplasm of left ovary: Secondary | ICD-10-CM

## 2019-02-03 LAB — CA 125: Cancer Antigen (CA) 125: 1198 U/mL — ABNORMAL HIGH (ref 0.0–38.1)

## 2019-02-03 NOTE — Telephone Encounter (Addendum)
Called patient's daughter, [name], and advised her of CT scan scheduled for 02/09/19 at [facility] (arrival at 10:15, NPO 4 hours prior, and drink contrast at 8:30 and 9:30).  [name] verbalized agreement and would like to pick up the contrast at [facility].  She also said Jensyn will need assistance before the scan if she is not able to escort her.    [place] [facility]ology and arranged for [name] to pick up the contrast.  Tawanna Solo back and let her know.

## 2019-02-04 ENCOUNTER — Other Ambulatory Visit: Payer: Self-pay

## 2019-02-04 ENCOUNTER — Encounter: Payer: Self-pay | Admitting: Nurse Practitioner

## 2019-02-04 ENCOUNTER — Ambulatory Visit (INDEPENDENT_AMBULATORY_CARE_PROVIDER_SITE_OTHER): Payer: Medicare Other | Admitting: Nurse Practitioner

## 2019-02-04 DIAGNOSIS — J449 Chronic obstructive pulmonary disease, unspecified: Secondary | ICD-10-CM | POA: Diagnosis not present

## 2019-02-04 NOTE — Progress Notes (Signed)
Virtual Visit via Telephone Note  I connected with [name] on 02/04/19 at 11:30 AM EDT by telephone and verified that I am speaking with the correct person using two identifiers.   I discussed the limitations, risks, security and privacy concerns of performing an evaluation and management service by telephone and the availability of in person appointments. I also discussed with the patient that there may be a patient responsible charge related to this service. The patient expressed understanding and agreed to proceed.   History of Present Illness: 85 year old female with COPD, psoriatic arthritis, chronic rhinitis, and chronic cough.  Followed recently after thoracentesis for pleural effusion-patient has Pleurx drain.  Patient is followed by Dr. Lamonte Sakai.  Patient has a tele-visit today for follow-up.  She was last seen by me on 01/21/2019. She was treated with doxycycline and prednisone.  She has completed these medications and states that she is doing much better.  She underwent chemo Monday and has done well this week.  She states that she has not had a fever in the past week.  He continues to have her Pleurx drained by home health nursing.  She is compliant with Stiolto and has been using albuterol only as needed. Denies f/c/s, n/v/d, hemoptysis, PND, leg swelling.    Observations/Objective:  CXR 12/11/18 - COPD. Bibasilar scarring. Right PleurX catheter and Port-A-Cath unchanged. No visible pneumothorax.    Assessment and Plan: Patient states that she is much improved. She had chemo this week and has tolerated it well.   Patient Instructions  COPD (chronic obstructive pulmonary disease)  Continue Stiolto 2 puffs once daily as you have been taking it. Keep albuterol available to use 2 puffs up to every 4 hours if you were to needed for shortness of breath, wheezing, chest tightness.   Follow Up Instructions:  Follow up with Dr. Lamonte Sakai in 4 months or sooner if needed    I  discussed the assessment and treatment plan with the patient. The patient was provided an opportunity to ask questions and all were answered. The patient agreed with the plan and demonstrated an understanding of the instructions.   The patient was advised to call back or seek an in-person evaluation if the symptoms worsen or if the condition fails to improve as anticipated.  I provided 22 minutes of non-face-to-face time during this encounter.   Fenton Foy, NP

## 2019-02-04 NOTE — Assessment & Plan Note (Signed)
Patient states that she is much improved. She had chemo this week and has tolerated it well.   Patient Instructions  COPD (chronic obstructive pulmonary disease)  Continue Stiolto 2 puffs once daily as you have been taking it. Keep albuterol available to use 2 puffs up to every 4 hours if you were to needed for shortness of breath, wheezing, chest tightness.  Follow up: Follow up with Dr. Lamonte Sakai in 4 months or sooner if needed

## 2019-02-04 NOTE — Patient Instructions (Addendum)
COPD (chronic obstructive pulmonary disease)  Continue Stiolto 2 puffs once daily as you have been taking it. Keep albuterol available to use 2 puffs up to every 4 hours if you were to needed for shortness of breath, wheezing, chest tightness.  Follow up: Follow up with Dr. Lamonte Sakai in 4 months or sooner if needed

## 2019-02-05 ENCOUNTER — Telehealth (INDEPENDENT_AMBULATORY_CARE_PROVIDER_SITE_OTHER): Payer: Medicare Other | Admitting: Cardiothoracic Surgery

## 2019-02-05 ENCOUNTER — Other Ambulatory Visit: Payer: Self-pay

## 2019-02-05 DIAGNOSIS — J90 Pleural effusion, not elsewhere classified: Secondary | ICD-10-CM

## 2019-02-05 NOTE — Progress Notes (Signed)
3 week f/u pleural effusion/ prefers telephone call visit/ [phone], call cell phone./ Spoke with daughter [name]) she has  been draining her PleurX every 4 days. The last 3 amounts were 175 cc's, 50 cc's and 200 cc's She had chemotherapy on Monday this week and is not feeling well. No medication changes

## 2019-02-05 NOTE — Progress Notes (Signed)
[address]       [place] [address]             [phone]    Telehealth Visit     Virtual Visit via Telephone Note   This visit type was conducted due to national recommendations for restrictions regarding the COVID-19 Pandemic (e.g. social distancing) in an effort to limit this patient's exposure and mitigate transmission in our community.  Due to her co-morbid illnesses, this patient is at least at moderate risk for complications without adequate follow up.  This format is felt to be most appropriate for this patient at this time.  The patient did not have access to video technology/had technical difficulties with video requiring transitioning to audio format only (telephone).  All issues noted in this document were discussed and addressed.      [facility] Record #[id number] Date of Birth: 1932-11-23  Referring: Collene Gobble, MD Primary Care: Asencion Noble, MD Primary Cardiologist: Rozann Lesches, MD   Chief Complaint:   POST OP FOLLOW UP 06/24/2018 PREOPERATIVE DIAGNOSIS: Recurrent right pleural effusion with history of ovarian carcinoma. POSTOPERATIVE DIAGNOSIS: Recurrent right pleural effusion with history of ovarian carcinoma. SURGICAL PROCEDURE: Placement of right PleurX catheter with ultrasound and fluoroscopic guidance History of Present Illness:        I contacted [name] remotely due to the limitations of the current COVID pandemic on 02/05/2019  at  12:34 PM  verifying that I was speaking to [name] whose birthday is [birth year].   I discussed limitations of the evaluation and management  of patients remotely without  the benefit of physical exam.  The patient was agreeable with proceeding with a remote/ not face to face visit.  Conversation was through her daughter  The patient continues to drain the Pleurx with assistance of her daughter every 4 days.  The last 3 mounts and increase slightly to 175  mL 50 mL and 200 mL.  She notes today she has no fever or chills, does feel a little achy from her chemo on Monday.  She has no redness around the Pleurx site      Past Medical History:  Diagnosis Date   Anxiety    Chronic blood loss anemia    03-04-2018 diverticular bleed and rectal bleeding,  transfused 2 units PRBCs 03-08-2018   Colitis    COPD (chronic obstructive pulmonary disease) (HCC)    Dr. Lamonte Sakai   Depression    Diastolic CHF, chronic ([place])    Diverticulosis    Family history of colon cancer    Fibromyalgia    Genetic testing 04/07/2018   MyRisk (35 genes) @ [facility] - No pathogenic mutations detected   GERD (gastroesophageal reflux disease)    Hemorrhoids    Hiatal hernia    Hip pain 07/2018   Right Hip Pain   History of rectal polyps    History of shingles    Hypothyroidism    IBS (irritable bowel syndrome)    Lymphocytic colitis    Dr. Henrene Pastor   Malignant ascites    Admission 06/2017 abdominal s/p parencentesis 07-01-2017 2.5L, 07-08-2016  2.7L, 07-12-2017  1426m   Neuropathy due to chemotherapeutic drug ([place]    Osteoporosis    [place]varian cancer [place]    Chemotherapy - Dr. GAlvy Bimler  Paroxysmal atrial fibrillation ([place]    Xarelto stopped 03-07-2018 due to lower GI bleed   Pleural effusion    s/p  right thoracentesis, 02-2018 1.3L and 03-17-2018 right thoracentesis 668m , post cxr no residual effusion   Psoriatic arthritis ([facility]    Schatzki's ring 2013   Seasonal allergic rhinitis      Social History   Tobacco Use  Smoking Status Former Smoker   Packs/day: 1.00   Years: 20.00   Pack years: 20.00   Types: Cigarettes   Last attempt to quit: 10/08/1980   Years since quitting: 38.3  Smokeless Tobacco Never Used    Social History   Substance and Sexual Activity  Alcohol Use Not Currently   Comment: rarely, 12-23-15 rarely     Allergies  Allergen Reactions   Codeine Itching    Current Outpatient Medications    Medication Sig Dispense Refill   albuterol (PROAIR HFA) 108 (90 Base) MCG/ACT inhaler Inhale 2 puffs into the lungs every 6 (six) hours as needed for wheezing or shortness of breath. 1 Inhaler 3   ALPRAZolam (XANAX) 0.25 MG tablet Take 1 tablet (0.25 mg total) by mouth at bedtime as needed for anxiety. 5 tablet 0   amoxicillin (AMOXIL) 500 MG capsule TAKE 4 CAPSULES PO 1 HOUR PRIOR TO DENTAL APPOINTMENT  1   azelastine (ASTELIN) 0.1 % nasal spray Place 2 sprays into both nostrils 2 (two) times daily as needed for allergies.      CARTIA XT 120 MG 24 hr capsule      Cholecalciferol (VITAMIN D) 2000 units CAPS Take 2,000 Units by mouth daily.      dicyclomine (BENTYL) 10 MG capsule Take 1 capsule (10 mg total) by mouth 2 (two) times daily. 60 capsule 1   diltiazem (DILACOR XR) 180 MG 24 hr capsule Take 180 mg by mouth every morning.     diltiazem (TIAZAC) 120 MG 24 hr capsule Take 120 mg by mouth daily. At night time     diphenhydrAMINE (BENADRYL) 25 mg capsule Take 25 mg by mouth daily as needed for itching.      diphenoxylate-atropine (LOMOTIL) 2.5-0.025 MG tablet Take 1 tablet by mouth 4 (four) times daily. (Patient taking differently: Take 1 tablet by mouth 4 (four) times daily as needed for diarrhea or loose stools. ) 360 tablet 1   DULoxetine (CYMBALTA) 60 MG capsule TAKE 1 CAPSULE(60 MG) BY MOUTH DAILY 30 capsule 3   fluticasone (FLONASE) 50 MCG/ACT nasal spray Place 2 sprays into both nostrils 2 (two) times daily as needed (FOR NASAL CONGESTION.).      furosemide (LASIX) 40 MG tablet Take 1 tablet (40 mg total) by mouth daily. 90 tablet 3   halobetasol (ULTRAVATE) 0.05 % cream Apply 1 application topically 2 (two) times daily as needed (psoriasis).      levothyroxine (SYNTHROID, LEVOTHROID) 137 MCG tablet Take 137 mcg by mouth daily before breakfast. For thyroid therapy     Magnesium 400 MG CAPS Take 400 mg by mouth daily.     mirtazapine (REMERON) 15 MG tablet TAKE 1  TABLET(15 MG) BY MOUTH AT BEDTIME (Patient taking differently: Take 15 mg by mouth at bedtime. ) 30 tablet 11   Oxycodone HCl 10 MG TABS Take 1 tablet (10 mg total) by mouth every 6 (six) hours as needed. 60 tablet 0   polyvinyl alcohol (LIQUIFILM TEARS) 1.4 % ophthalmic solution Place 1 drop into both eyes 2 (two) times daily as needed for dry eyes.      potassium chloride SA (K-DUR,KLOR-CON) 20 MEQ tablet TAKE 1 TABLET BY MOUTH DAILY AND TAKE 1 ADDITIONAL TABLET WHEN TAKING EXTRA  LASIX DOSE 60 tablet 6   STIOLTO RESPIMAT 2.5-2.5 MCG/ACT AERS INHALE 2 PUFFS INTO THE LUNGS DAILY 1 g 3   XARELTO 20 MG TABS tablet TAKE 1 TABLET BY MOUTH EVERY DAY AFTER SUPPER 90 tablet 3   No current facility-administered medications for this visit.    Facility-Administered Medications Ordered in Other Visits  Medication Dose Route Frequency Provider Last Rate Last Dose   0.9 %  sodium chloride infusion (Manually program via Guardrails IV Fluids)  250 mL Intravenous Once Alvy Bimler, Ni, MD   Stopped at 12/16/18 1500   0.9 %  sodium chloride infusion   Intravenous Once Heath Lark, MD   Stopped at 12/16/18 1521   sodium chloride flush (NS) 0.9 % injection 10 mL  10 mL Intracatheter PRN Heath Lark, MD   10 mL at 12/16/18 1451       Physical Exam: Virtual visit    Diagnostic Studies & Laboratory data:     Recent Radiology Findings: No results found. Dg Chest 2 View  Result Date: 12/11/2018 CLINICAL DATA:  Pleural effusion, shortness of breath, cough EXAM: CHEST - 2 VIEW COMPARISON:  10/23/2018 FINDINGS: There is hyperinflation of the lungs compatible with COPD. PleurX catheter in place on the right, stable. No pneumothorax or visible effusions. Heart is normal size. Bibasilar scarring. Right Port-A-Cath is unchanged. IMPRESSION: COPD.  Bibasilar scarring. Right PleurX catheter and Port-A-Cath unchanged. No visible pneumothorax. Electronically Signed   By: Rolm Baptise M.D.   On: 12/11/2018 11:18  I  have independently reviewed the above radiology studies  and reviewed the findings with the patient.  Ct Abdomen Pelvis W Contrast  Result Date: 11/26/2018 CLINICAL DATA:  Ovarian cancer chemotherapy in progress. EXAM: CT ABDOMEN AND PELVIS WITH CONTRAST TECHNIQUE: Multidetector CT imaging of the abdomen and pelvis was performed using the standard protocol following bolus administration of intravenous contrast. CONTRAST:  156m OMNIPAQUE IOHEXOL 300 MG/ML  SOLN COMPARISON:  09/11/2018 and 09/18/2017. FINDINGS: Lower chest: 3 mm peripheral right lower lobe nodule (series 7, image 10) unchanged from 09/18/2017. Small bore chest tube is seen within a tiny right pleural effusion. Heart size normal. No pericardial effusion. Distal esophagus is grossly unremarkable. Hepatobiliary: Slight marginal irregularity of the liver may be due to known peritoneal disease. Liver is otherwise unremarkable. Common bile duct dilatation is unchanged and likely related to cholecystectomy. Pancreas: Negative. Spleen: Negative. Adrenals/Urinary Tract: Adrenal glands and right kidney are unremarkable. Subcentimeter low-attenuation lesion in the left kidney is too small to characterize. Ureters are decompressed. Bladder is low in volume. Stomach/Bowel: Stomach and small bowel are unremarkable. Appendix is not readily visualized. Unobstructed colon extends into a wide neck supraumbilical midline ventral hernia. Colon is otherwise unremarkable. Vascular/Lymphatic: Atherosclerotic calcification of the aorta without aneurysm. Retroaortic left renal vein. No pathologically enlarged lymph nodes. Reproductive: Hysterectomy.  No adnexal mass. Other: Slight residual peritoneal thickening along the right and left walls of the abdomen and pelvis, overall improved from 09/11/2018. Ascites has resolved. Musculoskeletal: Postoperative changes in the proximal femur. Degenerative changes spine and. No worrisome lytic or sclerotic lesions. L5 superior  endplate compression is unchanged. IMPRESSION: 1. Mild residual peritoneal thickening, overall decreased from 09/11/2018, with resolution of previously seen ascites. 2. Tiny residual right pleural effusion with small bore chest tube in place. 3. Supraumbilical midline ventral hernia has a wide neck and contains unobstructed colon. 4.  Aortic atherosclerosis (ICD10-170.0). Electronically Signed   By: MLorin PicketM.D.   On: 11/26/2018 12:16    I  have independently reviewed the above radiology studies  and reviewed the findings with the patient.    Dg Chest 2 View  Result Date: 09/11/2018 CLINICAL DATA:  Shortness of breath. Pleural effusion. EXAM: CHEST - 2 VIEW COMPARISON:  08/14/2018 FINDINGS: Right jugular Port-A-Cath remains in place terminating over the lower SVC, unchanged. A right-sided tunneled pleural catheter catheter is unchanged. The cardiomediastinal silhouette is unchanged with normal heart size. Aortic atherosclerosis is noted. The lungs remain hyperinflated with minimal scarring or atelectasis in the lung bases. No acute airspace consolidation or edema is identified. There is likely a trace right apical pneumothorax, and there is at most a trace right pleural effusion. No acute osseous abnormality is identified. IMPRESSION: Trace right apical pneumothorax and at most a trace right pleural effusion with right pleural catheter in place. No evidence of acute airspace disease. Electronically Signed   By: Logan Bores M.D.   On: 09/11/2018 14:35    Dg Chest 2 View  Result Date: 08/14/2018 CLINICAL DATA:  Follow-up right pleural effusion.  COPD. EXAM: CHEST - 2 VIEW COMPARISON:  07/16/2018 chest radiograph. FINDINGS: Right basilar chest tube terminates in the medial upper right pleural space, unchanged. Right internal jugular MediPort terminates in the lower third of the SVC. Stable cardiomediastinal silhouette with normal heart size. No convincing pneumothorax. No significant pleural  effusion. Hyperinflated lungs. No pulmonary edema. No acute consolidative airspace disease. Stable minimal bibasilar scarring versus atelectasis. IMPRESSION: 1. Stable right chest tube with no appreciable residual pneumothorax. 2. No significant pleural effusion. 3. Stable minimal bibasilar scarring versus atelectasis. 4. Hyperinflated lungs, compatible with the provided history of COPD. Electronically Signed   By: Ilona Sorrel M.D.   On: 08/14/2018 09:40   DCt Abdomen Pelvis W Contrast  Result Date: 07/23/2018 CLINICAL DATA:  Ovarian cancer, diagnosed 07/2015, status post surgery with adjunct chemotherapy. Oral chemotherapy ongoing. Recurrent pleural effusions. EXAM: CT ABDOMEN AND PELVIS WITH CONTRAST TECHNIQUE: Multidetector CT imaging of the abdomen and pelvis was performed using the standard protocol following bolus administration of intravenous contrast. CONTRAST:  16m OMNIPAQUE IOHEXOL 300 MG/ML  SOLN COMPARISON:  04/21/2018 FINDINGS: Lower chest: Trace right pleural effusion with indwelling pleural drain. Hepatobiliary: Liver is within normal limits. Status post cholecystectomy. No intrahepatic ductal dilatation. Dilated common duct, measuring 11 mm, chronic. Pancreas: Within normal limits. Spleen: Within normal limits. Adrenals/Urinary Tract: Adrenal glands within normal limits. Kidneys are within normal limits.  No hydronephrosis. Bladder is underdistended but unremarkable. Stomach/Bowel: Stomach is within normal limits. No evidence of bowel obstruction. Very mild sigmoid diverticulosis. Vascular/Lymphatic: No evidence of abdominal aortic aneurysm. Atherosclerotic calcifications of the abdominal aorta and branch vessels. No suspicious abdominopelvic lymphadenopathy. Reproductive: Status post hysterectomy and bilateral salpingo-oophorectomy. Other: No abdominopelvic ascites. Mild peritoneal thickening/nodularity on the left (series 2/images 58-59). Mild soft tissue nodularity along the right pelvic  cul-de-sac adjacent to the rectum (series 2/image 58). While equivocal, these findings are worrisome for very mild peritoneal disease. Musculoskeletal: Degenerative changes of the visualized thoracolumbar spine. IMPRESSION: Status post hysterectomy and bilateral salpingo-oophorectomy. Very mild peritoneal thickening/nodularity, equivocal but worrisome for very mild peritoneal disease. Attention on follow-up is suggested. Small right pleural effusion with indwelling pleural drain. Electronically Signed   By: SJulian HyM.D.   On: 07/23/2018 08:37    Recent Lab Findings: Lab Results  Component Value Date   WBC 12.9 (H) 02/02/2019   HGB 8.5 (L) 02/02/2019   HCT 28.2 (L) 02/02/2019   PLT 273 02/02/2019   GLUCOSE 101 (H)  02/02/2019   CHOL 147 11/14/2017   ALT 10 02/02/2019   AST 15 02/02/2019   NA 134 (L) 02/02/2019   K 4.1 02/02/2019   CL 97 (L) 02/02/2019   CREATININE 1.14 (H) 02/02/2019   BUN 17 02/02/2019   CO2 28 02/02/2019   TSH 0.388 11/10/2017   INR 1.09 06/24/2018   HGBA1C 5.7 (H) 06/12/2013     Assessment / Plan:   Continue drainage of Pleurx catheter, every 4 days as we have been doing. We will plan on removal of the Pleurx when it is consistently less then 100 mL each time. Patient is agreeable with this we will plan to see her back in 3 weeks.    Grace Isaac MD      [place] [place] [address] Office [phone]   Beeper [phone]  02/05/2019 12:34 PM

## 2019-02-06 ENCOUNTER — Telehealth: Payer: Self-pay | Admitting: Hematology and Oncology

## 2019-02-06 NOTE — Telephone Encounter (Signed)
The appointment on 5/5 is to review result of CT I will call [name] during visit if the patient is given a cellphone

## 2019-02-06 NOTE — Telephone Encounter (Signed)
Called [name] and explained that appointment on 02/10/19 is to review the CT scan results and that Dr. Alvy Bimler can call her during the appointment.  Verified that Teighan will have a cell phone with her.    Discussed that there are employees that will meet Pamala Hurry at the entrance to the hospital and will assist her with getting a wheelchair and getting to the radiology waiting room.  [name] asked what Iviona's Ca 125 results were and she was advised of results.  She verbalized agreement and understanding.

## 2019-02-06 NOTE — Telephone Encounter (Signed)
Spoke with patient dtr re 5/4 lab/port prior to ct scan and confirmed 5/5 f/u. Per dtr she was not aware of 5/5 f/u and wants to know if patient should have f/u 5/5 and 5/12.   Also dtr voices concern that patient will need assistance once in office (wheel chair - open/drink contrast) and that she does not like that her mom will have f/u and receive scan results without her being with patient. Daughter requesting a call during f/u visit. Added comments to appointment re patient needing assistance once in office. Message routed to NG.

## 2019-02-09 ENCOUNTER — Inpatient Hospital Stay: Payer: Medicare Other | Attending: Hematology and Oncology

## 2019-02-09 ENCOUNTER — Other Ambulatory Visit: Payer: Self-pay

## 2019-02-09 ENCOUNTER — Ambulatory Visit (HOSPITAL_COMMUNITY)
Admission: RE | Admit: 2019-02-09 | Discharge: 2019-02-09 | Disposition: A | Discharge status: Home / Self Care | Payer: Medicare Other | Source: Ambulatory Visit | Attending: Hematology and Oncology | Admitting: Hematology and Oncology

## 2019-02-09 ENCOUNTER — Encounter (HOSPITAL_COMMUNITY): Payer: Self-pay

## 2019-02-09 ENCOUNTER — Inpatient Hospital Stay: Payer: Medicare Other

## 2019-02-09 DIAGNOSIS — Z79899 Other long term (current) drug therapy: Secondary | ICD-10-CM | POA: Diagnosis not present

## 2019-02-09 DIAGNOSIS — J910 Malignant pleural effusion: Secondary | ICD-10-CM | POA: Diagnosis not present

## 2019-02-09 DIAGNOSIS — J90 Pleural effusion, not elsewhere classified: Secondary | ICD-10-CM | POA: Diagnosis not present

## 2019-02-09 DIAGNOSIS — I700 Atherosclerosis of aorta: Secondary | ICD-10-CM | POA: Diagnosis not present

## 2019-02-09 DIAGNOSIS — C562 Malignant neoplasm of left ovary: Secondary | ICD-10-CM | POA: Diagnosis present

## 2019-02-09 DIAGNOSIS — D61818 Other pancytopenia: Secondary | ICD-10-CM | POA: Insufficient documentation

## 2019-02-09 DIAGNOSIS — G620 Drug-induced polyneuropathy: Secondary | ICD-10-CM | POA: Diagnosis not present

## 2019-02-09 DIAGNOSIS — K56600 Partial intestinal obstruction, unspecified as to cause: Secondary | ICD-10-CM | POA: Diagnosis not present

## 2019-02-09 DIAGNOSIS — Z7901 Long term (current) use of anticoagulants: Secondary | ICD-10-CM | POA: Diagnosis not present

## 2019-02-09 DIAGNOSIS — E440 Moderate protein-calorie malnutrition: Secondary | ICD-10-CM | POA: Diagnosis not present

## 2019-02-09 DIAGNOSIS — T451X5A Adverse effect of antineoplastic and immunosuppressive drugs, initial encounter: Secondary | ICD-10-CM | POA: Insufficient documentation

## 2019-02-09 DIAGNOSIS — D638 Anemia in other chronic diseases classified elsewhere: Secondary | ICD-10-CM

## 2019-02-09 LAB — COMPREHENSIVE METABOLIC PANEL
ALT: 14 U/L (ref 0–44)
AST: 19 U/L (ref 15–41)
Albumin: 2.7 g/dL — ABNORMAL LOW (ref 3.5–5.0)
Alkaline Phosphatase: 58 U/L (ref 38–126)
Anion gap: 7 (ref 5–15)
BUN: 15 mg/dL (ref 8–23)
CO2: 33 mmol/L — ABNORMAL HIGH (ref 22–32)
Calcium: 7.8 mg/dL — ABNORMAL LOW (ref 8.9–10.3)
Chloride: 94 mmol/L — ABNORMAL LOW (ref 98–111)
Creatinine, Ser: 1.08 mg/dL — ABNORMAL HIGH (ref 0.44–1.00)
GFR calc Af Amer: 55 mL/min — ABNORMAL LOW (ref >60)
GFR calc non Af Amer: 47 mL/min — ABNORMAL LOW (ref >60)
Glucose, Bld: 99 mg/dL (ref 70–99)
Potassium: 4.4 mmol/L (ref 3.5–5.1)
Sodium: 134 mmol/L — ABNORMAL LOW (ref 135–145)
Total Bilirubin: 0.3 mg/dL (ref 0.3–1.2)
Total Protein: 7 g/dL (ref 6.5–8.1)

## 2019-02-09 LAB — CBC WITH DIFFERENTIAL/PLATELET
Abs Immature Granulocytes: 0.12 10*3/uL — ABNORMAL HIGH (ref 0.00–0.07)
Basophils Absolute: 0 10*3/uL (ref 0.0–0.1)
Basophils Relative: 1 %
Eosinophils Absolute: 0.1 10*3/uL (ref 0.0–0.5)
Eosinophils Relative: 1 %
HCT: 27.4 % — ABNORMAL LOW (ref 36.0–46.0)
Hemoglobin: 8.3 g/dL — ABNORMAL LOW (ref 12.0–15.0)
Immature Granulocytes: 2 %
Lymphocytes Relative: 21 %
Lymphs Abs: 1 10*3/uL (ref 0.7–4.0)
MCH: 25.4 pg — ABNORMAL LOW (ref 26.0–34.0)
MCHC: 30.3 g/dL (ref 30.0–36.0)
MCV: 83.8 fL (ref 80.0–100.0)
Monocytes Absolute: 0.6 10*3/uL (ref 0.1–1.0)
Monocytes Relative: 12 %
Neutro Abs: 3.1 10*3/uL (ref 1.7–7.7)
Neutrophils Relative %: 63 %
Platelets: 203 10*3/uL (ref 150–400)
RBC: 3.27 MIL/uL — ABNORMAL LOW (ref 3.87–5.11)
RDW: 17.5 % — ABNORMAL HIGH (ref 11.5–15.5)
WBC: 5 10*3/uL (ref 4.0–10.5)
nRBC: 0 % (ref 0.0–0.2)

## 2019-02-09 LAB — SAMPLE TO BLOOD BANK

## 2019-02-09 MED ORDER — SODIUM CHLORIDE (PF) 0.9 % IJ SOLN
INTRAMUSCULAR | Status: AC
  Filled 2019-02-09: qty 50

## 2019-02-09 MED ORDER — IOHEXOL 300 MG/ML  SOLN
75.0000 mL | Freq: Once | INTRAMUSCULAR | Status: AC | PRN
  Administered 2019-02-09: 10:00:00 75 mL via INTRAVENOUS

## 2019-02-09 MED ORDER — SODIUM CHLORIDE 0.9% FLUSH
10.0000 mL | Freq: Once | INTRAVENOUS | Status: AC
  Administered 2019-02-09: 10 mL
  Filled 2019-02-09: qty 10

## 2019-02-09 MED ORDER — HEPARIN SOD (PORK) LOCK FLUSH 100 UNIT/ML IV SOLN
500.0000 [IU] | Freq: Once | INTRAVENOUS | Status: AC
  Administered 2019-02-09: 10:00:00 500 [IU] via INTRAVENOUS

## 2019-02-09 MED ORDER — HEPARIN SOD (PORK) LOCK FLUSH 100 UNIT/ML IV SOLN
INTRAVENOUS | Status: AC
  Administered 2019-02-09: 10:00:00 500 [IU] via INTRAVENOUS
  Filled 2019-02-09: qty 5

## 2019-02-10 ENCOUNTER — Inpatient Hospital Stay: Payer: Medicare Other | Admitting: Hematology and Oncology

## 2019-02-11 ENCOUNTER — Other Ambulatory Visit: Payer: Self-pay

## 2019-02-11 ENCOUNTER — Inpatient Hospital Stay (HOSPITAL_BASED_OUTPATIENT_CLINIC_OR_DEPARTMENT_OTHER): Payer: Medicare Other | Admitting: Hematology and Oncology

## 2019-02-11 DIAGNOSIS — J90 Pleural effusion, not elsewhere classified: Secondary | ICD-10-CM

## 2019-02-11 DIAGNOSIS — C562 Malignant neoplasm of left ovary: Secondary | ICD-10-CM | POA: Diagnosis not present

## 2019-02-11 DIAGNOSIS — D61818 Other pancytopenia: Secondary | ICD-10-CM

## 2019-02-11 DIAGNOSIS — G629 Polyneuropathy, unspecified: Secondary | ICD-10-CM | POA: Diagnosis not present

## 2019-02-11 DIAGNOSIS — G620 Drug-induced polyneuropathy: Secondary | ICD-10-CM | POA: Diagnosis not present

## 2019-02-11 DIAGNOSIS — R180 Malignant ascites: Secondary | ICD-10-CM

## 2019-02-11 DIAGNOSIS — K56600 Partial intestinal obstruction, unspecified as to cause: Secondary | ICD-10-CM

## 2019-02-11 DIAGNOSIS — Z7901 Long term (current) use of anticoagulants: Secondary | ICD-10-CM

## 2019-02-11 DIAGNOSIS — Z7189 Other specified counseling: Secondary | ICD-10-CM

## 2019-02-11 DIAGNOSIS — K5909 Other constipation: Secondary | ICD-10-CM

## 2019-02-11 DIAGNOSIS — J910 Malignant pleural effusion: Secondary | ICD-10-CM

## 2019-02-11 DIAGNOSIS — Z79899 Other long term (current) drug therapy: Secondary | ICD-10-CM

## 2019-02-11 DIAGNOSIS — T451X5A Adverse effect of antineoplastic and immunosuppressive drugs, initial encounter: Secondary | ICD-10-CM

## 2019-02-11 DIAGNOSIS — E440 Moderate protein-calorie malnutrition: Secondary | ICD-10-CM

## 2019-02-12 ENCOUNTER — Encounter: Payer: Self-pay | Admitting: Hematology and Oncology

## 2019-02-12 NOTE — Assessment & Plan Note (Signed)
She had intermittent constipation secondary to peritoneal disease After CT imaging, she had reasonable bowel movement I reviewed imaging studies with the patient I recommend aggressive laxative therapy

## 2019-02-12 NOTE — Assessment & Plan Note (Signed)
She has persistent pancytopenia from recent treatment but does not require blood transfusion We will observe and recheck next week

## 2019-02-12 NOTE — Progress Notes (Signed)
[place] OFFICE PROGRESS NOTE  Patient Care Team: Asencion Noble, MD as PCP - General (Internal Medicine) Satira Sark, MD as PCP - Cardiology (Cardiology) Ahmed Prima, Fransisco Hertz, PA-C as Physician Assistant (Physician Assistant) Heath Lark, MD as Consulting Physician (Hematology and Oncology) Collene Gobble, MD as Consulting Physician (Pulmonary Disease)  ASSESSMENT & PLAN:  Left ovarian epithelial cancer [facility]) 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 The patient have significant multiple comorbidities Her risk for future treatment is very high in the expected benefits are low The patient appears to be interested for more treatment but is hesitant due to risk of side effects including infection, worsening neuropathy and others I reviewed the guidelines with the patient and her daughter She could potentially be treated with weekly Taxol or once every 3 weeks Taxotere The risks, benefits, side effects of those treatment were discussed including risk of infection, neuropathy, hair loss, nausea and others She is undecided I will call her daughter again in 2 days to discuss her final decision.  Pancytopenia, acquired [facility]) She has persistent pancytopenia from recent treatment but does not require blood transfusion We will observe and recheck next week  Peripheral neuropathy due to chemotherapy [facility]) She has residual peripheral neuropathy from prior treatment We discussed the risk of worsening neuropathy if she plans attempt further chemotherapy in the future  Pleural effusion She has mild pleural effusion on CT but not symptomatic Observe only  Protein-calorie malnutrition, moderate (HCC) She has signs of moderate protein calorie malnutrition based on recent blood work She  will continue to increase oral intake as tolerated  Other constipation She had intermittent constipation secondary to peritoneal disease After CT imaging, she had reasonable bowel movement I reviewed imaging studies with the patient I recommend aggressive laxative therapy  Goals of care, counseling/discussion We had numerous goals of care discussion in the past I reviewed poor prognosis with the patient and her daughter Personally, I would not recommend her to pursue further chemotherapy She will have further discussion at home and I will call her again at the end of the week for final decision.  Ascites, malignant She has presence of ascites on her abdomen but I do not believe she would benefit from paracentesis right now.   No orders of the defined types were placed in this encounter.   INTERVAL HISTORY: Please see below for problem oriented charting. She returns to review test results Her daughter, [name], was placed on speaker phone and she was able to review the plan of care Since last time I saw her, she has intermittent constipation. After CT scan, she had large bowel movement She denies nausea Her appetite is poor She denies significant shortness of breath but overall is rather weak The patient denies any recent signs or symptoms of bleeding such as spontaneous epistaxis, hematuria or hematochezia. She felt bloated with abdominal distention  SUMMARY OF ONCOLOGIC HISTORY: Oncology History   High grade serous ER 90%, PR 0% BRCA 1: no loss of expression MMR normal  R1 resection, platinum refractory, progressed on Femara, carboplatin and Gemzar     Left ovarian epithelial cancer ([place])   02/18/2016 Tumor Marker    Patient's tumor was tested for the following markers: CA125 Results of the tumor marker test revealed 45    05/22/2016 Tumor Marker  Patient's tumor was tested for the following markers: CA125 Results of the tumor marker test revealed 53    05/22/2016  Imaging    Outside pelvic US showed 4.1 cm adnexa mass    06/24/2017 Imaging    Ct abdomen and pelvis:  1. Interim finding of moderate ascites within the abdomen and pelvis with additional finding of diffuse nodular infiltration of the omentum and anterior mesenteric fat, the appearance would be consistent with peritoneal carcinomatosis/metastatic disease. Increasing retroperitoneal and upper abdominal adenopathy. 2. Re- demonstrated 3.8 cm cyst in the right adnexa. Enlarging soft tissue density in the left adnexa now with possible cystic component posteriorly. In light of the above findings, concern is for ovarian neoplasm. Correlation with pelvic ultrasound recommended. 3. Small right-sided pleural effusion, new since prior study 4. Stable hypodense splenic lesions since 2017.     06/25/2017 Imaging    US pelvis: 2.9 cm simple appearing cyst in the right ovary. Left ovary grossly unremarkable. Large volume ascites in the pelvis    06/30/2017 - 07/01/2017 Hospital Admission    She was admitted for evaluation of abdominal pain and ascites    07/01/2017 Pathology Results    PERITONEAL/ASCITIC FLUID(SPECIMEN 1 OF 1 COLLECTED 07/01/17): - POORLY DIFFERENTIATED CARCINOMA; SEE COMMENT Source Peritoneal/Ascitic Fluid, (specimen 1 of 1 collected 07/01/17) Gross Specimen: Received is/are 1000 cc's of brownish fluid. (BS:bs) Prepared: # Smears: 0 # Concentration Technique Slides (i.e. ThinPrep): 1 # Cell Block: 1 Additional Studies: Also received Hematology slide - [id number]. Comment The tumor cells are positive for cytokeratin 7 and Pax-8 but negative for cytokeratin 20, CDX-2, GATA-3, Napsin-A and TTF-1. Based on the immunoprofile a gynecology primary is favored    07/01/2017 Procedure    Successful ultrasound-guided diagnostic and therapeutic paracentesis yielding 2.5 liters of peritoneal fluid    07/07/2017 - 07/09/2017 Hospital Admission    She was admitted for management of malignant ascites     07/08/2017 Procedure    Successful ultrasound-guided therapeutic paracentesis yielding 2.7 liters liters of peritoneal fluid    07/12/2017 Procedure    Successful ultrasound-guided paracentesis yielding 1450 mL of peritoneal fluid    07/18/2017 - 07/24/2017 Hospital Admission    She was admitted for expedited treatment    07/18/2017 Tumor Marker    Patient's tumor was tested for the following markers: CA125 Results of the tumor marker test revealed 1941    07/19/2017 - 02/04/2018 Chemotherapy    The patient had 6 cycles of carboplatin & Taxol for chemotherapy treatment, followed by 3 more cycles of carboplatin only     07/19/2017 - 02/04/2018 Chemotherapy    The patient had carboplatin and taxol    08/06/2017 Procedure    Successful ultrasound-guided therapeutic paracentesis yielding 2.6 liters of peritoneal fluid.    08/09/2017 Tumor Marker    Patient's tumor was tested for the following markers: CA125 Results of the tumor marker test revealed 1665    08/15/2017 Tumor Marker    Patient's tumor was tested for the following markers: CA125 Results of the tumor marker test revealed 937.9    08/20/2017 Imaging    ECHO: Normal LV size with EF 60-65%. Normal RV size and systolic function. No significant valvular abnormalities.    09/18/2017 Imaging    Chest Impression:  1. No evidence thoracic metastasis. 2. Interval increase and RIGHT pleural effusion.  Abdomen / Pelvis Impression:  1. Interval decrease in intraperitoneal free fluid. 2. Interval decrease in omental nodularity in the LEFT ventral peritoneal space. 3. Interval decrease  in nodularity associated with the LEFT ovary. 4. Cystic portion of the RIGHT ovary is increased mildly in size.    09/20/2017 Tumor Marker    Patient's tumor was tested for the following markers: CA125 Results of the tumor marker test revealed 347    10/14/2017 Tumor Marker    Patient's tumor was tested for the following markers: CA125 Results  of the tumor marker test revealed 307.4    11/04/2017 Tumor Marker    Patient's tumor was tested for the following markers: CA125 Results of the tumor marker test revealed 262.5    11/28/2017 Imaging    1. Interval decrease in right pleural effusion with resolution of right atelectasis seen previously. 2. New small left pleural effusion, symmetric to the right. 3. No intraperitoneal free fluid on the current study. 4. Continued further decrease in left omental disease, appearing less confluent today than on the prior study. 5. Left ovary remains normal in appearance today and the right adnexal cystic lesion is decreased in size compared to prior study. 6. 14 mm pancreatic cyst is unchanged. Continued attention on follow-up imaging recommended. 7. Aortic Atherosclerois (ICD10-170.0)    12/13/2017 Tumor Marker    Patient's tumor was tested for the following markers: CA125 Results of the tumor marker test revealed 197.7    01/03/2018 Tumor Marker    Patient's tumor was tested for the following markers: CA125 Results of the tumor marker test revealed 183.1    01/14/2018 Tumor Marker    Patient's tumor was tested for the following markers: CA125 Results of the tumor marker test revealed 177.4    02/04/2018 Tumor Marker    Patient's tumor was tested for the following markers: CA125 Results of the tumor marker test revealed 168.5    02/25/2018 Imaging    1. Omental carcinomatosis appears qualitatively stable to slightly decreased. Stable mild peritoneal thickening in the paracolic gutters. Stable right adnexal cyst. No ascites. No new or progressive metastatic disease in the abdomen or pelvis. 2. Small dependent right pleural effusion is increased. 3. Cystic pancreatic body lesion is decreased and now subcentimeter in size, suggesting a benign lesion. 4. Aortic Atherosclerosis (ICD10-I70.0).    03/03/2018 - 03/07/2018 Hospital Admission    She was hospitalized for GI bleed requiring blood  transfusions. Xarelto was placed on hold    03/07/2018 PET scan    1. Persistent hazy omental interstitial nodularity but no hypermetabolism or discrete measurable nodules. No abdominal ascites. 2. No findings for metastatic disease involving the chest. 3. Moderate-sized right pleural effusion and small left pleural effusion.     03/20/2018 Pathology Results    1. Ovary and fallopian tube, right - OVARY AND FALLOPIAN TUBE INVOLVED BY SEROUS CARCINOMA. - PARATUBAL CYST. 2. Uterus +/- tubes/ovaries, neoplastic, cervix, left ovary and fallopian tube - LEFT OVARY: HIGH GRADE SEROUS CARCINOMA WITH TREATMENT EFFECT, SPANNING 2.5 CM. CARCINOMA INVOLVES OVARIAN SURFACE. SEE ONCOLOGY TABLE. - LEFT FALLOPIAN TUBE: INVOLVED BY SEROUS CARCINOMA. - UTERUS: -ENDOMETRIUM: INACTIVE ENDOMETRIUM. NO HYPERPLASIA OR MALIGNANCY. -MYOMETRIUM: UNREMARKABLE. NO MALIGNANCY. -SEROSA: INVOLVED BY SEROUS CARCINOMA. - CERVIX: ENDOCERVICAL POLYP. NO MALIGNANCY. 3. Omentum, resection for tumor - INVOLVED BY SEROUS CARCINOMA. 4. Soft tissue, biopsy, mesenteric nodule - INVOLVED BY SEROUS CARCINOMA. Microscopic Comment 2. OVARY or FALLOPIAN TUBE or PRIMARY PERITONEUM: Procedure: Total hysterectomy and bilateral salpingo-oophorectomy. Omentectomy. Mesenteric lymph node biopsy. Specimen Integrity: Intact. Tumor Site: Left ovary. Ovarian Surface Involvement (required only if applicable): Present. Fallopian Tube Surface Involvement (required only if applicable): Present, bilateral. Tumor Size: 2.5  cm. Histologic Type: High grade serous carcinoma. Histologic Grade: High grade. Implants (required for advanced stage serous/seromucinous borderline tumors only): N/A. Other Tissue/ Organ Involvement: Bilateral fallopian tubes, right ovary, uterine serosa, omentum. Largest Extrapelvic Peritoneal Focus (required only if applicable): Microscopic, estimated 0.5 cm (omentum). Peritoneal/Ascitic Fluid: Prior Positive  ([id number]). Treatment Effect (required only for high-grade serous carcinomas): Present in left ovary. CRS2. Regional Lymph Nodes: No lymph nodes submitted/identified. Pathologic Stage Classification (pTNM, AJCC 8th Edition): ypT3b, ypNX Representative Tumor Block: 1A, 1B, 75F, 2F. Comment(s): The right ovary has only surface deposits with a large paratubal cyst. The left ovary has intraparenchymal tumor with associated treatment effect. Thus the tumor location is classified as a left ovarian primary.    03/20/2018 Surgery    Procedure(s) Performed:  1. Exploratory laparotomy with total hysterectomy and bilateral salpingo-oophorectomy 2. Infragastic Omentectomy  3. Debulking to <1cm gross residual disease   Surgeon: Mart Piggs, MD  Specimens: Uterus Cervix, Bilateral tubes / ovaries and omentum. Mesenteric nodule.  Operative Findings: Debulked to gross residual disease <1cm; however there is miliary disease in multiple locations including the majority of the abdominal peritoneum (anterior abdominal wall, bilateral gutters), diaphragm (Right>left), majority of small bowel mesentary. Normal appendix. Normal small uterus. Right ovary with a cystic lesion ~3cm, some adhesive disease of right adnexa to rectum/sigmoid. Gross omental disease, which was resected with the omentectomy. Smooth liver surface, but again, diaphragmatic disease noted.       03/20/2018 Genetic Testing    Patient has genetic testing done for ER/PR. Results revealed patient has ER: 90%, PR 0%.     03/31/2018 Tumor Marker    Patient's tumor was tested for the following markers: CA125 Results of the tumor marker test revealed 215.8     Genetic Testing    Patient has genetic testing done for BRCA 1. Results revealed patient has the following: BRCA 1: no loss of expression.     Genetic Testing    Patient has genetic testing done for MMR . Results revealed patient has the following:  MMR: normal     03/31/2018 Genetic Testing    Patient has genetic testing done for BRCA1/2. Results revealed patient has no actionable mutations. She is found to have [place] genetic change of unknown significance    04/21/2018 Imaging    1. No definite findings of residual or recurrent metastatic disease in the abdomen or pelvis status post interval TAHBSO and omentectomy. Stable minimal thickening in the paracolic gutters without discrete peritoneal nodularity. 2. Trace free fluid in the pelvic cul-de-sac. 3. Stable small dependent right pleural effusion. 4. Subcentimeter pancreatic body cystic lesion is stable to slightly decreased. 5. Aortic Atherosclerosis (ICD10-I70.0).    04/21/2018 Tumor Marker    Patient's tumor was tested for the following markers: CA125 Results of the tumor marker test revealed 187.3    04/23/2018 - 09/12/2018 Anti-estrogen oral therapy    She is placed on Femara    05/08/2018 Procedure    Successful ultrasound guided right thoracentesis yielding 800 mL of pleural fluid. Fluid cytology is negative for malignancy     05/28/2018 Tumor Marker    Patient's tumor was tested for the following markers: CA125 Results of the tumor marker test revealed 190    06/30/2018 Tumor Marker    Patient's tumor was tested for the following markers: CA125 Results of the tumor marker test revealed 148    07/23/2018 Imaging    Status post hysterectomy and bilateral salpingo-oophorectomy.  Very mild peritoneal thickening/nodularity,  equivocal but worrisome for very mild peritoneal disease. Attention on follow-up is suggested.  Small right pleural effusion with indwelling pleural drain.    07/23/2018 Tumor Marker    Patient's tumor was tested for the following markers: CA125 Results of the tumor marker test revealed 215    09/11/2018 Tumor Marker    Patient's tumor was tested for the following markers: CA-125. Results of the tumor marker test revealed 1323    09/12/2018 Imaging    1. Mildly  progressive ascites with some mild degree of nodularity most appreciable along the left adnexa, right paracolic gutter, right upper quadrant, compatible with peritoneal spread of tumor. 2. Small right pleural effusion with indwelling right pleural catheter again noted. Mild increase in atelectasis anteriorly at the right lung base. 3. Aortic Atherosclerosis (ICD10-I70.0). Coronary atherosclerosis. 4. Several tiny hypodense lesions in the spleen are technically nonspecific, but stable. 5. Air fluid level in the rectum compatible with diarrheal process. 6. Prominent bilateral hip arthropathy. Left hip screw noted.    09/22/2018 - 02/02/2019 Chemotherapy    The patient had carboplatin and gemzar    09/29/2018 Tumor Marker    Patient's tumor was tested for the following markers: CA125 Results of the tumor marker test revealed 1219    09/29/2018 Adverse Reaction    Cycle 1 day 8 of Gemzar was omitted due to severe anemia    10/20/2018 Tumor Marker    Patient's tumor was tested for the following markers: CA125 Results of the tumor marker test revealed 402    11/13/2018 Tumor Marker    Patient's tumor was tested for the following markers: CA125 Results of the tumor marker test revealed 437    11/26/2018 Imaging    1. Mild residual peritoneal thickening, overall decreased from 09/11/2018, with resolution of previously seen ascites. 2. Tiny residual right pleural effusion with small bore chest tube in place. 3. Supraumbilical midline ventral hernia has a wide neck and contains unobstructed colon. 4.  Aortic atherosclerosis (ICD10-170.0).     01/05/2019 Tumor Marker    Patient's tumor was tested for the following markers: CA125 Results of the tumor marker test revealed 929    02/02/2019 Tumor Marker    Patient's tumor was tested for the following markers: CA125 Results of the tumor marker test revealed 1198    02/09/2019 Imaging    1. New mild ascites. Mild peritoneal thickening shows no  significant change, but remains consistent with peritoneal carcinomatosis. 2. Increased tiny bilateral pleural effusions. 3. Partial small bowel obstruction with transition point in the anterior lower abdomen, just deep to the anterior abdominal wall. 4. Stable small ventral abdominal wall hernia containing transverse colon.     REVIEW OF SYSTEMS:   Constitutional: Denies fevers, chills or abnormal weight loss Eyes: Denies blurriness of vision Ears, nose, mouth, throat, and face: Denies mucositis or sore throat Cardiovascular: Denies palpitation, chest discomfort or lower extremity swelling Skin: Denies abnormal skin rashes Lymphatics: Denies new lymphadenopathy or easy bruising Behavioral/Psych: Mood is stable, no new changes  All other systems were reviewed with the patient and are negative.  I have reviewed the past medical history, past surgical history, social history and family history with the patient and they are unchanged from previous note.  ALLERGIES:  is allergic to codeine.  MEDICATIONS:  Current Outpatient Medications  Medication Sig Dispense Refill  . albuterol (PROAIR HFA) 108 (90 Base) MCG/ACT inhaler Inhale 2 puffs into the lungs every 6 (six) hours as needed for wheezing or shortness of  breath. 1 Inhaler 3  . ALPRAZolam (XANAX) 0.25 MG tablet Take 1 tablet (0.25 mg total) by mouth at bedtime as needed for anxiety. 5 tablet 0  . amoxicillin (AMOXIL) 500 MG capsule TAKE 4 CAPSULES PO 1 HOUR PRIOR TO DENTAL APPOINTMENT  1  . azelastine (ASTELIN) 0.1 % nasal spray Place 2 sprays into both nostrils 2 (two) times daily as needed for allergies.     Marland Kitchen CARTIA XT 120 MG 24 hr capsule     . Cholecalciferol (VITAMIN D) 2000 units CAPS Take 2,000 Units by mouth daily.     Marland Kitchen dicyclomine (BENTYL) 10 MG capsule Take 1 capsule (10 mg total) by mouth 2 (two) times daily. 60 capsule 1  . diltiazem (DILACOR XR) 180 MG 24 hr capsule Take 180 mg by mouth every morning.    . diltiazem  (TIAZAC) 120 MG 24 hr capsule Take 120 mg by mouth daily. At night time    . diphenhydrAMINE (BENADRYL) 25 mg capsule Take 25 mg by mouth daily as needed for itching.     . diphenoxylate-atropine (LOMOTIL) 2.5-0.025 MG tablet Take 1 tablet by mouth 4 (four) times daily. (Patient taking differently: Take 1 tablet by mouth 4 (four) times daily as needed for diarrhea or loose stools. ) 360 tablet 1  . DULoxetine (CYMBALTA) 60 MG capsule TAKE 1 CAPSULE(60 MG) BY MOUTH DAILY 30 capsule 3  . fluticasone (FLONASE) 50 MCG/ACT nasal spray Place 2 sprays into both nostrils 2 (two) times daily as needed (FOR NASAL CONGESTION.).     Marland Kitchen furosemide (LASIX) 40 MG tablet Take 1 tablet (40 mg total) by mouth daily. 90 tablet 3  . halobetasol (ULTRAVATE) 0.05 % cream Apply 1 application topically 2 (two) times daily as needed (psoriasis).     Marland Kitchen levothyroxine (SYNTHROID, LEVOTHROID) 137 MCG tablet Take 137 mcg by mouth daily before breakfast. For thyroid therapy    . Magnesium 400 MG CAPS Take 400 mg by mouth daily.    . mirtazapine (REMERON) 15 MG tablet TAKE 1 TABLET(15 MG) BY MOUTH AT BEDTIME (Patient taking differently: Take 15 mg by mouth at bedtime. ) 30 tablet 11  . Oxycodone HCl 10 MG TABS Take 1 tablet (10 mg total) by mouth every 6 (six) hours as needed. 60 tablet 0  . polyvinyl alcohol (LIQUIFILM TEARS) 1.4 % ophthalmic solution Place 1 drop into both eyes 2 (two) times daily as needed for dry eyes.     . potassium chloride SA (K-DUR,KLOR-CON) 20 MEQ tablet TAKE 1 TABLET BY MOUTH DAILY AND TAKE 1 ADDITIONAL TABLET WHEN TAKING EXTRA LASIX DOSE 60 tablet 6  . STIOLTO RESPIMAT 2.5-2.5 MCG/ACT AERS INHALE 2 PUFFS INTO THE LUNGS DAILY 1 g 3  . XARELTO 20 MG TABS tablet TAKE 1 TABLET BY MOUTH EVERY DAY AFTER SUPPER 90 tablet 3   No current facility-administered medications for this visit.    Facility-Administered Medications Ordered in Other Visits  Medication Dose Route Frequency Provider Last Rate Last Dose   . 0.9 %  sodium chloride infusion (Manually program via Guardrails IV Fluids)  250 mL Intravenous Once Heath Lark, MD   Stopped at 12/16/18 1500  . 0.9 %  sodium chloride infusion   Intravenous Once Heath Lark, MD   Stopped at 12/16/18 1521  . sodium chloride flush (NS) 0.9 % injection 10 mL  10 mL Intracatheter PRN Alvy Bimler, Javar Eshbach, MD   10 mL at 12/16/18 1451    PHYSICAL EXAMINATION: ECOG PERFORMANCE STATUS: 2 - Symptomatic, <  50% confined to bed  Vitals:   02/11/19 1120  BP: (!) 126/59  Pulse: 93  Resp: 18  Temp: 98.1 F (36.7 C)  SpO2: 96%   Filed Weights   02/11/19 1120  Weight: 182 lb 6.4 oz (82.7 kg)    GENERAL:alert, no distress and comfortable ABDOMEN:abdomen appears distended Musculoskeletal:no cyanosis of digits and no clubbing  NEURO: alert & oriented x 3 with fluent speech, no focal motor/sensory deficits  LABORATORY DATA:  I have reviewed the data as listed    Component Value Date/Time   NA 134 (L) 02/09/2019 0916   NA 135 (L) 09/24/2017 0945   K 4.4 02/09/2019 0916   K 3.7 09/24/2017 0945   CL 94 (L) 02/09/2019 0916   CO2 33 (H) 02/09/2019 0916   CO2 30 (H) 09/24/2017 0945   GLUCOSE 99 02/09/2019 0916   GLUCOSE 101 09/24/2017 0945   BUN 15 02/09/2019 0916   BUN 15.3 09/24/2017 0945   CREATININE 1.08 (H) 02/09/2019 0916   CREATININE 1.22 (H) 12/29/2018 0900   CREATININE 0.8 09/24/2017 0945   CALCIUM 7.8 (L) 02/09/2019 0916   CALCIUM 9.1 09/24/2017 0945   PROT 7.0 02/09/2019 0916   PROT 6.6 09/24/2017 0945   ALBUMIN 2.7 (L) 02/09/2019 0916   ALBUMIN 3.3 (L) 09/24/2017 0945   AST 19 02/09/2019 0916   AST 26 12/29/2018 0900   AST 16 09/24/2017 0945   ALT 14 02/09/2019 0916   ALT 21 12/29/2018 0900   ALT 12 09/24/2017 0945   ALKPHOS 58 02/09/2019 0916   ALKPHOS 52 09/24/2017 0945   BILITOT 0.3 02/09/2019 0916   BILITOT 0.2 (L) 12/29/2018 0900   BILITOT 1.13 09/24/2017 0945   GFRNONAA 47 (L) 02/09/2019 0916   GFRNONAA 41 (L) 12/29/2018 0900    GFRAA 55 (L) 02/09/2019 0916   GFRAA 47 (L) 12/29/2018 0900    No results found for: SPEP, UPEP  Lab Results  Component Value Date   WBC 5.0 02/09/2019   NEUTROABS 3.1 02/09/2019   HGB 8.3 (L) 02/09/2019   HCT 27.4 (L) 02/09/2019   MCV 83.8 02/09/2019   PLT 203 02/09/2019      Chemistry      Component Value Date/Time   NA 134 (L) 02/09/2019 0916   NA 135 (L) 09/24/2017 0945   K 4.4 02/09/2019 0916   K 3.7 09/24/2017 0945   CL 94 (L) 02/09/2019 0916   CO2 33 (H) 02/09/2019 0916   CO2 30 (H) 09/24/2017 0945   BUN 15 02/09/2019 0916   BUN 15.3 09/24/2017 0945   CREATININE 1.08 (H) 02/09/2019 0916   CREATININE 1.22 (H) 12/29/2018 0900   CREATININE 0.8 09/24/2017 0945      Component Value Date/Time   CALCIUM 7.8 (L) 02/09/2019 0916   CALCIUM 9.1 09/24/2017 0945   ALKPHOS 58 02/09/2019 0916   ALKPHOS 52 09/24/2017 0945   AST 19 02/09/2019 0916   AST 26 12/29/2018 0900   AST 16 09/24/2017 0945   ALT 14 02/09/2019 0916   ALT 21 12/29/2018 0900   ALT 12 09/24/2017 0945   BILITOT 0.3 02/09/2019 0916   BILITOT 0.2 (L) 12/29/2018 0900   BILITOT 1.13 09/24/2017 0945       RADIOGRAPHIC STUDIES: I have reviewed multiple imaging studies with the patient I have personally reviewed the radiological images as listed and agreed with the findings in the report. Ct Abdomen Pelvis W Contrast  Result Date: 02/09/2019 CLINICAL DATA:  Metastatic left ovarian epithelial carcinoma.  Currently undergoing chemotherapy. Restaging. EXAM: CT ABDOMEN AND PELVIS WITH CONTRAST TECHNIQUE: Multidetector CT imaging of the abdomen and pelvis was performed using the standard protocol following bolus administration of intravenous contrast. CONTRAST:  12m OMNIPAQUE IOHEXOL 300 MG/ML  SOLN COMPARISON:  11/26/2018 FINDINGS: Lower Chest: Increased tiny bilateral pleural effusions. Right pleural catheter remains in place. Hepatobiliary: No hepatic masses identified. Prior cholecystectomy. No evidence of  biliary obstruction. Pancreas:  No mass or inflammatory changes. Spleen: Within normal limits in size. Several sub-cm low-attenuation splenic lesions remain stable. Adrenals/Urinary Tract: No masses identified. No evidence of hydronephrosis. Stomach/Bowel: Mild dilatation is proximal and mid small bowel loops is seen with apparent transition point in the anterior lower abdomen, just deep to the anterior abdominal wall. This consistent with a partial small bowel obstruction. Mild diverticulosis is seen involving the sigmoid colon, however there is no evidence of diverticulitis. Vascular/Lymphatic: No pathologically enlarged lymph nodes. No abdominal aortic aneurysm. Aortic atherosclerosis. Reproductive: Prior hysterectomy. Mild ascites is new since previous study. Mild peritoneal thickening in the pelvis and along the paracolic gutters shows no significant change, consistent with peritoneal carcinomatosis. Other: Stable small ventral abdominal wall hernia containing transverse colon. Musculoskeletal:  No suspicious bone lesions identified. IMPRESSION: 1. New mild ascites. Mild peritoneal thickening shows no significant change, but remains consistent with peritoneal carcinomatosis. 2. Increased tiny bilateral pleural effusions. 3. Partial small bowel obstruction with transition point in the anterior lower abdomen, just deep to the anterior abdominal wall. 4. Stable small ventral abdominal wall hernia containing transverse colon. Electronically Signed   By: JEarle GellM.D.   On: 02/09/2019 13:11    All questions were answered. The patient knows to call the clinic with any problems, questions or concerns. No barriers to learning was detected.  I spent 30 minutes counseling the patient face to face. The total time spent in the appointment was 40 minutes and more than 50% was on counseling and review of test results  NHeath Lark MD 02/12/2019 8:35 AM

## 2019-02-12 NOTE — Assessment & Plan Note (Signed)
We had numerous goals of care discussion in the past I reviewed poor prognosis with the patient and her daughter Personally, I would not recommend her to pursue further chemotherapy She will have further discussion at home and I will call her again at the end of the week for final decision.

## 2019-02-12 NOTE — Assessment & Plan Note (Signed)
She has mild pleural effusion on CT but not symptomatic Observe only

## 2019-02-12 NOTE — Assessment & Plan Note (Signed)
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 The patient have significant multiple comorbidities Her risk for future treatment is very high in the expected benefits are low The patient appears to be interested for more treatment but is hesitant due to risk of side effects including infection, worsening neuropathy and others I reviewed the guidelines with the patient and her daughter She could potentially be treated with weekly Taxol or once every 3 weeks Taxotere The risks, benefits, side effects of those treatment were discussed including risk of infection, neuropathy, hair loss, nausea and others She is undecided I will call her daughter again in 2 days to discuss her final decision.

## 2019-02-12 NOTE — Assessment & Plan Note (Signed)
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

## 2019-02-12 NOTE — Assessment & Plan Note (Signed)
She has signs of moderate protein calorie malnutrition based on recent blood work She will continue to increase oral intake as tolerated

## 2019-02-12 NOTE — Assessment & Plan Note (Signed)
She has presence of ascites on her abdomen but I do not believe she would benefit from paracentesis right now.

## 2019-02-13 ENCOUNTER — Telehealth: Payer: Self-pay | Admitting: Hematology and Oncology

## 2019-02-13 ENCOUNTER — Other Ambulatory Visit: Payer: Self-pay | Admitting: Hematology and Oncology

## 2019-02-13 NOTE — Progress Notes (Signed)
DISCONTINUE OFF PATHWAY REGIMEN - Ovarian   [id number]:Carboplatin AUC=4 D1 + Gemcitabine 800 mg/m2 D1, 8 q21 Days:   A cycle is every 21 days:     Gemcitabine      Carboplatin   **Always confirm dose/schedule in your pharmacy ordering system**  REASON: Disease Progression PRIOR TREATMENT: Off Pathway: Carboplatin AUC=4 D1 + Gemcitabine 800 mg/m2 D1, 8 q21 Days TREATMENT RESPONSE: Progressive Disease (PD)  START OFF PATHWAY REGIMEN - Ovarian   [id number]:Paclitaxel 80 mg/m2 Weekly:   Administer weekly:     Paclitaxel   **Always confirm dose/schedule in your pharmacy ordering system**  Patient Characteristics: Recurrent or Progressive Disease, Third Line Therapy, Platinum Resistant or < 6 Months Since Last Platinum Therapy Therapeutic Status: Recurrent or Progressive Disease BRCA Mutation Status: Absent Line of Therapy: Third Line  Intent of Therapy: Non-Curative / Palliative Intent, Discussed with Patient

## 2019-02-13 NOTE — Telephone Encounter (Signed)
I spoke with [name] over the telephone We discussed treatment options The patient would like to continue on palliative chemo despite excessive risk of infection and other complications We discussed options including Taxol versus Taxotere The risk, benefits, side effects were discussed over the telephone and ultimately, she elected to proceed with Taxol Given her history of severe pancytopenia, baseline neuropathy, her age and extensive comorbidities, I recommend Taxol every other week with dose adjustment We will get insurance prior authorization and hopefully we can get her started next week.

## 2019-02-16 ENCOUNTER — Ambulatory Visit: Payer: Self-pay

## 2019-02-16 ENCOUNTER — Telehealth: Payer: Self-pay

## 2019-02-16 ENCOUNTER — Ambulatory Visit: Payer: Self-pay | Admitting: Hematology and Oncology

## 2019-02-16 ENCOUNTER — Other Ambulatory Visit: Payer: Self-pay

## 2019-02-16 NOTE — Telephone Encounter (Signed)
Called and given below message to [place]. She verbalized understanding.  [name] request if possible in the future if appts can be on Monday's. Just FYI.

## 2019-02-16 NOTE — Telephone Encounter (Signed)
-----   Message from Heath Lark, MD sent at 02/16/2019  1:15 PM EDT ----- Regarding: let [name] know she can keep appt tomorrow

## 2019-02-17 ENCOUNTER — Inpatient Hospital Stay: Payer: Medicare Other

## 2019-02-17 ENCOUNTER — Other Ambulatory Visit: Payer: Self-pay

## 2019-02-17 ENCOUNTER — Inpatient Hospital Stay (HOSPITAL_BASED_OUTPATIENT_CLINIC_OR_DEPARTMENT_OTHER): Payer: Medicare Other | Admitting: Hematology and Oncology

## 2019-02-17 ENCOUNTER — Encounter: Payer: Self-pay | Admitting: Hematology and Oncology

## 2019-02-17 DIAGNOSIS — C562 Malignant neoplasm of left ovary: Secondary | ICD-10-CM | POA: Diagnosis not present

## 2019-02-17 DIAGNOSIS — G629 Polyneuropathy, unspecified: Secondary | ICD-10-CM | POA: Diagnosis not present

## 2019-02-17 DIAGNOSIS — G620 Drug-induced polyneuropathy: Secondary | ICD-10-CM

## 2019-02-17 DIAGNOSIS — I700 Atherosclerosis of aorta: Secondary | ICD-10-CM

## 2019-02-17 DIAGNOSIS — J910 Malignant pleural effusion: Secondary | ICD-10-CM

## 2019-02-17 DIAGNOSIS — D638 Anemia in other chronic diseases classified elsewhere: Secondary | ICD-10-CM

## 2019-02-17 DIAGNOSIS — Z7189 Other specified counseling: Secondary | ICD-10-CM

## 2019-02-17 DIAGNOSIS — T451X5A Adverse effect of antineoplastic and immunosuppressive drugs, initial encounter: Secondary | ICD-10-CM

## 2019-02-17 DIAGNOSIS — D61818 Other pancytopenia: Secondary | ICD-10-CM

## 2019-02-17 DIAGNOSIS — E440 Moderate protein-calorie malnutrition: Secondary | ICD-10-CM

## 2019-02-17 DIAGNOSIS — J90 Pleural effusion, not elsewhere classified: Secondary | ICD-10-CM

## 2019-02-17 DIAGNOSIS — Z79899 Other long term (current) drug therapy: Secondary | ICD-10-CM

## 2019-02-17 DIAGNOSIS — K56600 Partial intestinal obstruction, unspecified as to cause: Secondary | ICD-10-CM

## 2019-02-17 DIAGNOSIS — Z7901 Long term (current) use of anticoagulants: Secondary | ICD-10-CM

## 2019-02-17 DIAGNOSIS — R180 Malignant ascites: Secondary | ICD-10-CM

## 2019-02-17 LAB — CBC WITH DIFFERENTIAL/PLATELET
Abs Immature Granulocytes: 0.12 10*3/uL — ABNORMAL HIGH (ref 0.00–0.07)
Basophils Absolute: 0 10*3/uL (ref 0.0–0.1)
Basophils Relative: 0 %
Eosinophils Absolute: 0.2 10*3/uL (ref 0.0–0.5)
Eosinophils Relative: 2 %
HCT: 28 % — ABNORMAL LOW (ref 36.0–46.0)
Hemoglobin: 8.3 g/dL — ABNORMAL LOW (ref 12.0–15.0)
Immature Granulocytes: 2 %
Lymphocytes Relative: 12 %
Lymphs Abs: 0.9 10*3/uL (ref 0.7–4.0)
MCH: 24.8 pg — ABNORMAL LOW (ref 26.0–34.0)
MCHC: 29.6 g/dL — ABNORMAL LOW (ref 30.0–36.0)
MCV: 83.6 fL (ref 80.0–100.0)
Monocytes Absolute: 0.9 10*3/uL (ref 0.1–1.0)
Monocytes Relative: 12 %
Neutro Abs: 5.7 10*3/uL (ref 1.7–7.7)
Neutrophils Relative %: 72 %
Platelets: 340 10*3/uL (ref 150–400)
RBC: 3.35 MIL/uL — ABNORMAL LOW (ref 3.87–5.11)
RDW: 17.3 % — ABNORMAL HIGH (ref 11.5–15.5)
WBC: 7.8 10*3/uL (ref 4.0–10.5)
nRBC: 0 % (ref 0.0–0.2)

## 2019-02-17 LAB — COMPREHENSIVE METABOLIC PANEL
ALT: 14 U/L (ref 0–44)
AST: 19 U/L (ref 15–41)
Albumin: 2.6 g/dL — ABNORMAL LOW (ref 3.5–5.0)
Alkaline Phosphatase: 66 U/L (ref 38–126)
Anion gap: 7 (ref 5–15)
BUN: 17 mg/dL (ref 8–23)
CO2: 29 mmol/L (ref 22–32)
Calcium: 8.7 mg/dL — ABNORMAL LOW (ref 8.9–10.3)
Chloride: 96 mmol/L — ABNORMAL LOW (ref 98–111)
Creatinine, Ser: 1.28 mg/dL — ABNORMAL HIGH (ref 0.44–1.00)
GFR calc Af Amer: 45 mL/min — ABNORMAL LOW (ref >60)
GFR calc non Af Amer: 39 mL/min — ABNORMAL LOW (ref >60)
Glucose, Bld: 113 mg/dL — ABNORMAL HIGH (ref 70–99)
Potassium: 4.1 mmol/L (ref 3.5–5.1)
Sodium: 132 mmol/L — ABNORMAL LOW (ref 135–145)
Total Bilirubin: 0.2 mg/dL — ABNORMAL LOW (ref 0.3–1.2)
Total Protein: 6.7 g/dL (ref 6.5–8.1)

## 2019-02-17 LAB — SAMPLE TO BLOOD BANK

## 2019-02-17 MED ORDER — HEPARIN SOD (PORK) LOCK FLUSH 100 UNIT/ML IV SOLN
500.0000 [IU] | Freq: Once | INTRAVENOUS | Status: AC | PRN
  Administered 2019-02-17: 500 [IU]
  Filled 2019-02-17: qty 5

## 2019-02-17 MED ORDER — DIPHENHYDRAMINE HCL 50 MG/ML IJ SOLN
INTRAMUSCULAR | Status: AC
  Filled 2019-02-17: qty 1

## 2019-02-17 MED ORDER — SODIUM CHLORIDE 0.9% FLUSH
10.0000 mL | Freq: Once | INTRAVENOUS | Status: AC
  Administered 2019-02-17: 10 mL
  Filled 2019-02-17: qty 10

## 2019-02-17 MED ORDER — SODIUM CHLORIDE 0.9 % IV SOLN
20.0000 mg | Freq: Once | INTRAVENOUS | Status: AC
  Administered 2019-02-17: 11:00:00 20 mg via INTRAVENOUS
  Filled 2019-02-17: qty 2

## 2019-02-17 MED ORDER — SODIUM CHLORIDE 0.9 % IV SOLN
64.0000 mg/m2 | Freq: Once | INTRAVENOUS | Status: AC
  Administered 2019-02-17: 126 mg via INTRAVENOUS
  Filled 2019-02-17: qty 21

## 2019-02-17 MED ORDER — SODIUM CHLORIDE 0.9% FLUSH
10.0000 mL | INTRAVENOUS | Status: DC | PRN
  Administered 2019-02-17: 10 mL
  Filled 2019-02-17: qty 10

## 2019-02-17 MED ORDER — DIPHENHYDRAMINE HCL 50 MG/ML IJ SOLN
50.0000 mg | Freq: Once | INTRAMUSCULAR | Status: AC
  Administered 2019-02-17: 50 mg via INTRAVENOUS

## 2019-02-17 MED ORDER — SODIUM CHLORIDE 0.9 % IV SOLN
Freq: Once | INTRAVENOUS | Status: AC
  Administered 2019-02-17: 11:00:00 via INTRAVENOUS
  Filled 2019-02-17: qty 250

## 2019-02-17 MED ORDER — FAMOTIDINE IN NACL 20-0.9 MG/50ML-% IV SOLN
INTRAVENOUS | Status: AC
  Filled 2019-02-17: qty 50

## 2019-02-17 MED ORDER — FAMOTIDINE IN NACL 20-0.9 MG/50ML-% IV SOLN
20.0000 mg | Freq: Once | INTRAVENOUS | Status: AC
  Administered 2019-02-17: 11:00:00 20 mg via INTRAVENOUS

## 2019-02-17 NOTE — Patient Instructions (Signed)
[facility] Discharge Instructions for Patients Receiving Chemotherapy  Today you received the following chemotherapy agents:  Taxol.  To help prevent nausea and vomiting after your treatment, we encourage you to take your nausea medication as directed.   If you develop nausea and vomiting that is not controlled by your nausea medication, call the clinic.   BELOW ARE SYMPTOMS THAT SHOULD BE REPORTED IMMEDIATELY:  *FEVER GREATER THAN 100.5 F  *CHILLS WITH OR WITHOUT FEVER  NAUSEA AND VOMITING THAT IS NOT CONTROLLED WITH YOUR NAUSEA MEDICATION  *UNUSUAL SHORTNESS OF BREATH  *UNUSUAL BRUISING OR BLEEDING  TENDERNESS IN MOUTH AND THROAT WITH OR WITHOUT PRESENCE OF ULCERS  *URINARY PROBLEMS  *BOWEL PROBLEMS  UNUSUAL RASH Items with * indicate a potential emergency and should be followed up as soon as possible.  Feel free to call the clinic should you have any questions or concerns. The clinic phone number is [phone].  Please show the CHEMO ALERT CARD at check-in to the Emergency Department and triage nurse.   

## 2019-02-17 NOTE — Progress Notes (Signed)
[place] OFFICE PROGRESS NOTE  Patient Care Team: Asencion Noble, MD as PCP - General (Internal Medicine) Satira Sark, MD as PCP - Cardiology (Cardiology) Ahmed Prima, Fransisco Hertz, PA-C as Physician Assistant (Physician Assistant) Heath Lark, MD as Consulting Physician (Hematology and Oncology) Collene Gobble, MD as Consulting Physician (Pulmonary Disease)  ASSESSMENT & PLAN:  Left ovarian epithelial cancer [facility]) I have reviewed the plan of care with the patient again She would like to proceed with further palliative chemotherapy We discussed the risk, benefits, side effects of single agent Taxol Due to her poor tolerance of treatment, I recommend reduced dose every other week She agreed with the plan of care  Pancytopenia, acquired [facility]) She has baseline pancytopenia due to repeat treatment with chemotherapy She does not need transfusion support We will proceed with dose modification  Peripheral neuropathy due to chemotherapy [facility]) She has residual peripheral neuropathy from prior treatment We will proceed with dose adjustment  Ascites, malignant She has mild ascites on exam but not symptomatic I do not recommend paracentesis right now  Goals of care, counseling/discussion We had numerous goals of care discussions in the past She understood that treatment goal is palliative She has significant multiple comorbidities and the risks of complications from chemotherapy is high and she would like to proceed with treatment We discussed CODE STATUS She is leaning towards DNR but has not made up her mind We will discuss further next visit   No orders of the defined types were placed in this encounter.   INTERVAL HISTORY: Please see below for problem oriented charting. She returns for further discussion about palliative chemotherapy Since last time I saw her, she has made informed decision to proceed with further palliative chemotherapy Neuropathy does not  bother her too much She continues to have fatigue She has intermittent abdominal cramps that comes and goes but it does not bother her With regular laxatives, she had a regular bowel movement No recent infection, fever or chills Shortness of breath is stable The patient denies any recent signs or symptoms of bleeding such as spontaneous epistaxis, hematuria or hematochezia.  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 Interim finding of moderate ascites within the abdomen and pelvis with additional finding of diffuse nodular infiltration of the omentum and anterior mesenteric fat, the appearance would be consistent with peritoneal carcinomatosis/metastatic disease. Increasing retroperitoneal and upper abdominal adenopathy. 2. Re- demonstrated 3.8 cm cyst in the right adnexa. Enlarging soft tissue density in the left adnexa now with possible cystic component posteriorly. In light of the above findings, concern is for ovarian neoplasm. Correlation with pelvic ultrasound recommended. 3. Small right-sided pleural effusion, new since prior study 4. Stable hypodense splenic lesions since 2017.     06/25/2017 Imaging    US pelvis: 2.9 cm simple appearing cyst  in the right ovary. Left ovary grossly unremarkable. Large volume ascites in the pelvis    06/30/2017 - 07/01/2017 Hospital Admission    She was  admitted for evaluation of abdominal pain and ascites    07/01/2017 Pathology Results    PERITONEAL/ASCITIC FLUID(SPECIMEN 1 OF 1 COLLECTED 07/01/17): - POORLY DIFFERENTIATED CARCINOMA; SEE COMMENT Source Peritoneal/Ascitic Fluid, (specimen 1 of 1 collected 07/01/17) Gross Specimen: Received is/are 1000 cc's of brownish fluid. (BS:bs) Prepared: # Smears: 0 # Concentration Technique Slides (i.e. ThinPrep): 1 # Cell Block: 1 Additional Studies: Also received Hematology slide - [id number]. Comment The tumor cells are positive for cytokeratin 7 and Pax-8 but negative for cytokeratin 20, CDX-2, GATA-3, Napsin-A and TTF-1. Based on the immunoprofile a gynecology primary is favored    07/01/2017 Procedure    Successful ultrasound-guided diagnostic and therapeutic paracentesis yielding 2.5 liters of peritoneal fluid    07/07/2017 - 07/09/2017 Hospital Admission    She was admitted for management of malignant ascites    07/08/2017 Procedure    Successful ultrasound-guided therapeutic paracentesis yielding 2.7 liters liters of peritoneal fluid    07/12/2017 Procedure    Successful ultrasound-guided paracentesis yielding 1450 mL of peritoneal fluid    07/18/2017 - 07/24/2017 Hospital Admission    She was admitted for expedited treatment    07/18/2017 Tumor Marker    Patient's tumor was tested for the following markers: CA125 Results of the tumor marker test revealed 1941    07/19/2017 - 02/04/2018 Chemotherapy    The patient had 6 cycles of carboplatin & Taxol for chemotherapy treatment, followed by 3 more cycles of carboplatin only     07/19/2017 - 02/04/2018 Chemotherapy    The patient had carboplatin and taxol    08/06/2017 Procedure    Successful ultrasound-guided therapeutic paracentesis yielding 2.6 liters of peritoneal fluid.    08/09/2017 Tumor Marker    Patient's tumor was tested for the following markers: CA125 Results of the tumor marker test revealed 1665    08/15/2017 Tumor Marker     Patient's tumor was tested for the following markers: CA125 Results of the tumor marker test revealed 937.9    08/20/2017 Imaging    ECHO: Normal LV size with EF 60-65%. Normal RV size and systolic function. No significant valvular abnormalities.    09/18/2017 Imaging    Chest Impression:  1. No evidence thoracic metastasis. 2. Interval increase and RIGHT pleural effusion.  Abdomen / Pelvis Impression:  1. Interval decrease in intraperitoneal free fluid. 2. Interval decrease in omental nodularity in the LEFT ventral peritoneal space. 3. Interval decrease in nodularity associated with the LEFT ovary. 4. Cystic portion of the RIGHT ovary is increased mildly in size.    09/20/2017 Tumor Marker    Patient's tumor was tested for the following markers: CA125 Results of the tumor marker test revealed 347    10/14/2017 Tumor Marker    Patient's tumor was tested for the following markers: CA125 Results of the tumor marker test revealed 307.4    11/04/2017 Tumor Marker    Patient's tumor was tested for the following markers: CA125 Results of the tumor marker test revealed 262.5    11/28/2017 Imaging    1. Interval decrease in right pleural effusion with resolution of right atelectasis seen previously. 2. New small left pleural effusion, symmetric to the right. 3. No intraperitoneal free fluid on the current study. 4. Continued further decrease in left omental disease, appearing less confluent today than on the prior study. 5. Left ovary  remains normal in appearance today and the right adnexal cystic lesion is decreased in size compared to prior study. 6. 14 mm pancreatic cyst is unchanged. Continued attention on follow-up imaging recommended. 7. Aortic Atherosclerois (ICD10-170.0)    12/13/2017 Tumor Marker    Patient's tumor was tested for the following markers: CA125 Results of the tumor marker test revealed 197.7    01/03/2018 Tumor Marker    Patient's tumor was tested for the  following markers: CA125 Results of the tumor marker test revealed 183.1    01/14/2018 Tumor Marker    Patient's tumor was tested for the following markers: CA125 Results of the tumor marker test revealed 177.4    02/04/2018 Tumor Marker    Patient's tumor was tested for the following markers: CA125 Results of the tumor marker test revealed 168.5    02/25/2018 Imaging    1. Omental carcinomatosis appears qualitatively stable to slightly decreased. Stable mild peritoneal thickening in the paracolic gutters. Stable right adnexal cyst. No ascites. No new or progressive metastatic disease in the abdomen or pelvis. 2. Small dependent right pleural effusion is increased. 3. Cystic pancreatic body lesion is decreased and now subcentimeter in size, suggesting a benign lesion. 4. Aortic Atherosclerosis (ICD10-I70.0).    03/03/2018 - 03/07/2018 Hospital Admission    She was hospitalized for GI bleed requiring blood transfusions. Xarelto was placed on hold    03/07/2018 PET scan    1. Persistent hazy omental interstitial nodularity but no hypermetabolism or discrete measurable nodules. No abdominal ascites. 2. No findings for metastatic disease involving the chest. 3. Moderate-sized right pleural effusion and small left pleural effusion.     03/20/2018 Pathology Results    1. Ovary and fallopian tube, right - OVARY AND FALLOPIAN TUBE INVOLVED BY SEROUS CARCINOMA. - PARATUBAL CYST. 2. Uterus +/- tubes/ovaries, neoplastic, cervix, left ovary and fallopian tube - LEFT OVARY: HIGH GRADE SEROUS CARCINOMA WITH TREATMENT EFFECT, SPANNING 2.5 CM. CARCINOMA INVOLVES OVARIAN SURFACE. SEE ONCOLOGY TABLE. - LEFT FALLOPIAN TUBE: INVOLVED BY SEROUS CARCINOMA. - UTERUS: -ENDOMETRIUM: INACTIVE ENDOMETRIUM. NO HYPERPLASIA OR MALIGNANCY. -MYOMETRIUM: UNREMARKABLE. NO MALIGNANCY. -SEROSA: INVOLVED BY SEROUS CARCINOMA. - CERVIX: ENDOCERVICAL POLYP. NO MALIGNANCY. 3. Omentum, resection for tumor - INVOLVED BY  SEROUS CARCINOMA. 4. Soft tissue, biopsy, mesenteric nodule - INVOLVED BY SEROUS CARCINOMA. Microscopic Comment 2. OVARY or FALLOPIAN TUBE or PRIMARY PERITONEUM: Procedure: Total hysterectomy and bilateral salpingo-oophorectomy. Omentectomy. Mesenteric lymph node biopsy. Specimen Integrity: Intact. Tumor Site: Left ovary. Ovarian Surface Involvement (required only if applicable): Present. Fallopian Tube Surface Involvement (required only if applicable): Present, bilateral. Tumor Size: 2.5 cm. Histologic Type: High grade serous carcinoma. Histologic Grade: High grade. Implants (required for advanced stage serous/seromucinous borderline tumors only): N/A. Other Tissue/ Organ Involvement: Bilateral fallopian tubes, right ovary, uterine serosa, omentum. Largest Extrapelvic Peritoneal Focus (required only if applicable): Microscopic, estimated 0.5 cm (omentum). Peritoneal/Ascitic Fluid: Prior Positive ([id number]). Treatment Effect (required only for high-grade serous carcinomas): Present in left ovary. CRS2. Regional Lymph Nodes: No lymph nodes submitted/identified. Pathologic Stage Classification (pTNM, AJCC 8th Edition): ypT3b, ypNX Representative Tumor Block: 1A, 1B, 75F, 35F. Comment(s): The right ovary has only surface deposits with a large paratubal cyst. The left ovary has intraparenchymal tumor with associated treatment effect. Thus the tumor location is classified as a left ovarian primary.    03/20/2018 Surgery    Procedure(s) Performed:  1. Exploratory laparotomy with total hysterectomy and bilateral salpingo-oophorectomy 2. Infragastic Omentectomy  3. Debulking to <1cm gross residual disease   Surgeon: Fabio Neighbors  Gerarda Fraction, MD  Specimens: Uterus Cervix, Bilateral tubes / ovaries and omentum. Mesenteric nodule.  Operative Findings: Debulked to gross residual disease <1cm; however there is miliary disease in multiple locations including the majority of the abdominal  peritoneum (anterior abdominal wall, bilateral gutters), diaphragm (Right>left), majority of small bowel mesentary. Normal appendix. Normal small uterus. Right ovary with a cystic lesion ~3cm, some adhesive disease of right adnexa to rectum/sigmoid. Gross omental disease, which was resected with the omentectomy. Smooth liver surface, but again, diaphragmatic disease noted.       03/20/2018 Genetic Testing    Patient has genetic testing done for ER/PR. Results revealed patient has ER: 90%, PR 0%.     03/31/2018 Tumor Marker    Patient's tumor was tested for the following markers: CA125 Results of the tumor marker test revealed 215.8     Genetic Testing    Patient has genetic testing done for BRCA 1. Results revealed patient has the following: BRCA 1: no loss of expression.     Genetic Testing    Patient has genetic testing done for MMR . Results revealed patient has the following:  MMR: normal    03/31/2018 Genetic Testing    Patient has genetic testing done for BRCA1/2. Results revealed patient has no actionable mutations. She is found to have [place] genetic change of unknown significance    04/21/2018 Imaging    1. No definite findings of residual or recurrent metastatic disease in the abdomen or pelvis status post interval TAHBSO and omentectomy. Stable minimal thickening in the paracolic gutters without discrete peritoneal nodularity. 2. Trace free fluid in the pelvic cul-de-sac. 3. Stable small dependent right pleural effusion. 4. Subcentimeter pancreatic body cystic lesion is stable to slightly decreased. 5. Aortic Atherosclerosis (ICD10-I70.0).    04/21/2018 Tumor Marker    Patient's tumor was tested for the following markers: CA125 Results of the tumor marker test revealed 187.3    04/23/2018 - 09/12/2018 Anti-estrogen oral therapy    She is placed on Femara    05/08/2018 Procedure    Successful ultrasound guided right thoracentesis yielding 800 mL of pleural fluid. Fluid  cytology is negative for malignancy     05/28/2018 Tumor Marker    Patient's tumor was tested for the following markers: CA125 Results of the tumor marker test revealed 190    06/30/2018 Tumor Marker    Patient's tumor was tested for the following markers: CA125 Results of the tumor marker test revealed 148    07/23/2018 Imaging    Status post hysterectomy and bilateral salpingo-oophorectomy.  Very mild peritoneal thickening/nodularity, equivocal but worrisome for very mild peritoneal disease. Attention on follow-up is suggested.  Small right pleural effusion with indwelling pleural drain.    07/23/2018 Tumor Marker    Patient's tumor was tested for the following markers: CA125 Results of the tumor marker test revealed 215    09/11/2018 Tumor Marker    Patient's tumor was tested for the following markers: CA-125. Results of the tumor marker test revealed 1323    09/12/2018 Imaging    1. Mildly progressive ascites with some mild degree of nodularity most appreciable along the left adnexa, right paracolic gutter, right upper quadrant, compatible with peritoneal spread of tumor. 2. Small right pleural effusion with indwelling right pleural catheter again noted. Mild increase in atelectasis anteriorly at the right lung base. 3. Aortic Atherosclerosis (ICD10-I70.0). Coronary atherosclerosis. 4. Several tiny hypodense lesions in the spleen are technically nonspecific, but stable. 5. Air fluid level in the rectum  compatible with diarrheal process. 6. Prominent bilateral hip arthropathy. Left hip screw noted.    09/22/2018 - 02/02/2019 Chemotherapy    The patient had carboplatin and gemzar    09/29/2018 Tumor Marker    Patient's tumor was tested for the following markers: CA125 Results of the tumor marker test revealed 1219    09/29/2018 Adverse Reaction    Cycle 1 day 8 of Gemzar was omitted due to severe anemia    10/20/2018 Tumor Marker    Patient's tumor was tested for the  following markers: CA125 Results of the tumor marker test revealed 402    11/13/2018 Tumor Marker    Patient's tumor was tested for the following markers: CA125 Results of the tumor marker test revealed 437    11/26/2018 Imaging    1. Mild residual peritoneal thickening, overall decreased from 09/11/2018, with resolution of previously seen ascites. 2. Tiny residual right pleural effusion with small bore chest tube in place. 3. Supraumbilical midline ventral hernia has a wide neck and contains unobstructed colon. 4.  Aortic atherosclerosis (ICD10-170.0).     01/05/2019 Tumor Marker    Patient's tumor was tested for the following markers: CA125 Results of the tumor marker test revealed 929    02/02/2019 Tumor Marker    Patient's tumor was tested for the following markers: CA125 Results of the tumor marker test revealed 1198    02/09/2019 Imaging    1. New mild ascites. Mild peritoneal thickening shows no significant change, but remains consistent with peritoneal carcinomatosis. 2. Increased tiny bilateral pleural effusions. 3. Partial small bowel obstruction with transition point in the anterior lower abdomen, just deep to the anterior abdominal wall. 4. Stable small ventral abdominal wall hernia containing transverse colon.    02/17/2019 -  Chemotherapy    The patient had PACLitaxel (TAXOL) 126 mg in sodium chloride 0.9 % 250 mL chemo infusion (</= 50m/m2), 64 mg/m2 = 126 mg (80 % of original dose 80 mg/m2), Intravenous,  Once, 1 of 12 cycles Dose modification: 64 mg/m2 (80 % of original dose 80 mg/m2, Cycle 1, Reason: Patient Age)  for chemotherapy treatment.      REVIEW OF SYSTEMS:   Constitutional: Denies fevers, chills or abnormal weight loss Eyes: Denies blurriness of vision Ears, nose, mouth, throat, and face: Denies mucositis or sore throat Cardiovascular: Denies palpitation, chest discomfort or lower extremity swelling Gastrointestinal:  Denies nausea, heartburn or change in  bowel habits Skin: Denies abnormal skin rashes Lymphatics: Denies new lymphadenopathy or easy bruising Neurological:Denies numbness, tingling or new weaknesses Behavioral/Psych: Mood is stable, no new changes  All other systems were reviewed with the patient and are negative.  I have reviewed the past medical history, past surgical history, social history and family history with the patient and they are unchanged from previous note.  ALLERGIES:  is allergic to codeine.  MEDICATIONS:  Current Outpatient Medications  Medication Sig Dispense Refill  . albuterol (PROAIR HFA) 108 (90 Base) MCG/ACT inhaler Inhale 2 puffs into the lungs every 6 (six) hours as needed for wheezing or shortness of breath. 1 Inhaler 3  . ALPRAZolam (XANAX) 0.25 MG tablet Take 1 tablet (0.25 mg total) by mouth at bedtime as needed for anxiety. 5 tablet 0  . amoxicillin (AMOXIL) 500 MG capsule TAKE 4 CAPSULES PO 1 HOUR PRIOR TO DENTAL APPOINTMENT  1  . azelastine (ASTELIN) 0.1 % nasal spray Place 2 sprays into both nostrils 2 (two) times daily as needed for allergies.     .Marland Kitchen  CARTIA XT 120 MG 24 hr capsule     . Cholecalciferol (VITAMIN D) 2000 units CAPS Take 2,000 Units by mouth daily.     Marland Kitchen dicyclomine (BENTYL) 10 MG capsule Take 1 capsule (10 mg total) by mouth 2 (two) times daily. 60 capsule 1  . diltiazem (DILACOR XR) 180 MG 24 hr capsule Take 180 mg by mouth every morning.    . diltiazem (TIAZAC) 120 MG 24 hr capsule Take 120 mg by mouth daily. At night time    . diphenhydrAMINE (BENADRYL) 25 mg capsule Take 25 mg by mouth daily as needed for itching.     . diphenoxylate-atropine (LOMOTIL) 2.5-0.025 MG tablet Take 1 tablet by mouth 4 (four) times daily. (Patient taking differently: Take 1 tablet by mouth 4 (four) times daily as needed for diarrhea or loose stools. ) 360 tablet 1  . DULoxetine (CYMBALTA) 60 MG capsule TAKE 1 CAPSULE(60 MG) BY MOUTH DAILY 30 capsule 3  . fluticasone (FLONASE) 50 MCG/ACT nasal spray  Place 2 sprays into both nostrils 2 (two) times daily as needed (FOR NASAL CONGESTION.).     Marland Kitchen furosemide (LASIX) 40 MG tablet Take 1 tablet (40 mg total) by mouth daily. 90 tablet 3  . halobetasol (ULTRAVATE) 0.05 % cream Apply 1 application topically 2 (two) times daily as needed (psoriasis).     Marland Kitchen levothyroxine (SYNTHROID, LEVOTHROID) 137 MCG tablet Take 137 mcg by mouth daily before breakfast. For thyroid therapy    . Magnesium 400 MG CAPS Take 400 mg by mouth daily.    . mirtazapine (REMERON) 15 MG tablet TAKE 1 TABLET(15 MG) BY MOUTH AT BEDTIME (Patient taking differently: Take 15 mg by mouth at bedtime. ) 30 tablet 11  . Oxycodone HCl 10 MG TABS Take 1 tablet (10 mg total) by mouth every 6 (six) hours as needed. 60 tablet 0  . polyvinyl alcohol (LIQUIFILM TEARS) 1.4 % ophthalmic solution Place 1 drop into both eyes 2 (two) times daily as needed for dry eyes.     . potassium chloride SA (K-DUR,KLOR-CON) 20 MEQ tablet TAKE 1 TABLET BY MOUTH DAILY AND TAKE 1 ADDITIONAL TABLET WHEN TAKING EXTRA LASIX DOSE 60 tablet 6  . STIOLTO RESPIMAT 2.5-2.5 MCG/ACT AERS INHALE 2 PUFFS INTO THE LUNGS DAILY 1 g 3  . XARELTO 20 MG TABS tablet TAKE 1 TABLET BY MOUTH EVERY DAY AFTER SUPPER 90 tablet 3   No current facility-administered medications for this visit.    Facility-Administered Medications Ordered in Other Visits  Medication Dose Route Frequency Provider Last Rate Last Dose  . 0.9 %  sodium chloride infusion (Manually program via Guardrails IV Fluids)  250 mL Intravenous Once Heath Lark, MD   Stopped at 12/16/18 1500  . dexamethasone (DECADRON) 20 mg in sodium chloride 0.9 % 50 mL IVPB  20 mg Intravenous Once Alvy Bimler, Moyinoluwa Dawe, MD      . famotidine (PEPCID) IVPB 20 mg premix  20 mg Intravenous Once Alvy Bimler, Essie Gehret, MD 100 mL/hr at 02/17/19 1053 20 mg at 02/17/19 1053  . heparin lock flush 100 unit/mL  500 Units Intracatheter Once PRN Alvy Bimler, Laraina Sulton, MD      . PACLitaxel (TAXOL) 126 mg in sodium chloride 0.9 % 250  mL chemo infusion (</= 68m/m2)  64 mg/m2 (Treatment Plan Recorded) Intravenous Once Letti Towell, MD      . sodium chloride flush (NS) 0.9 % injection 10 mL  10 mL Intracatheter PRN GHeath Lark MD        PHYSICAL EXAMINATION:  ECOG PERFORMANCE STATUS: 2 - Symptomatic, <50% confined to bed  Vitals:   02/17/19 0958  BP: (!) 127/55  Pulse: 72  Resp: 17  Temp: 97.9 F (36.6 C)  SpO2: 99%   Filed Weights   02/17/19 0958  Weight: 185 lb 9.6 oz (84.2 kg)    GENERAL:alert, no distress and comfortable SKIN: skin color, texture, turgor are normal, no rashes or significant lesions EYES: normal, Conjunctiva are pink and non-injected, sclera clear OROPHARYNX:no exudate, no erythema and lips, buccal mucosa, and tongue normal  NECK: supple, thyroid normal size, non-tender, without nodularity LYMPH:  no palpable lymphadenopathy in the cervical, axillary or inguinal LUNGS: clear to auscultation and percussion with normal breathing effort HEART: regular rate & rhythm and no murmurs and no lower extremity edema ABDOMEN:abdomen soft, non-tender and normal bowel sounds. Distended with ascites Musculoskeletal:no cyanosis of digits and no clubbing  NEURO: alert & oriented x 3 with fluent speech, no focal motor/sensory deficits  LABORATORY DATA:  I have reviewed the data as listed    Component Value Date/Time   NA 132 (L) 02/17/2019 0935   NA 135 (L) 09/24/2017 0945   K 4.1 02/17/2019 0935   K 3.7 09/24/2017 0945   CL 96 (L) 02/17/2019 0935   CO2 29 02/17/2019 0935   CO2 30 (H) 09/24/2017 0945   GLUCOSE 113 (H) 02/17/2019 0935   GLUCOSE 101 09/24/2017 0945   BUN 17 02/17/2019 0935   BUN 15.3 09/24/2017 0945   CREATININE 1.28 (H) 02/17/2019 0935   CREATININE 1.22 (H) 12/29/2018 0900   CREATININE 0.8 09/24/2017 0945   CALCIUM 8.7 (L) 02/17/2019 0935   CALCIUM 9.1 09/24/2017 0945   PROT 6.7 02/17/2019 0935   PROT 6.6 09/24/2017 0945   ALBUMIN 2.6 (L) 02/17/2019 0935   ALBUMIN 3.3 (L)  09/24/2017 0945   AST 19 02/17/2019 0935   AST 26 12/29/2018 0900   AST 16 09/24/2017 0945   ALT 14 02/17/2019 0935   ALT 21 12/29/2018 0900   ALT 12 09/24/2017 0945   ALKPHOS 66 02/17/2019 0935   ALKPHOS 52 09/24/2017 0945   BILITOT 0.2 (L) 02/17/2019 0935   BILITOT 0.2 (L) 12/29/2018 0900   BILITOT 1.13 09/24/2017 0945   GFRNONAA 39 (L) 02/17/2019 0935   GFRNONAA 41 (L) 12/29/2018 0900   GFRAA 45 (L) 02/17/2019 0935   GFRAA 47 (L) 12/29/2018 0900    No results found for: SPEP, UPEP  Lab Results  Component Value Date   WBC 7.8 02/17/2019   NEUTROABS 5.7 02/17/2019   HGB 8.3 (L) 02/17/2019   HCT 28.0 (L) 02/17/2019   MCV 83.6 02/17/2019   PLT 340 02/17/2019      Chemistry      Component Value Date/Time   NA 132 (L) 02/17/2019 0935   NA 135 (L) 09/24/2017 0945   K 4.1 02/17/2019 0935   K 3.7 09/24/2017 0945   CL 96 (L) 02/17/2019 0935   CO2 29 02/17/2019 0935   CO2 30 (H) 09/24/2017 0945   BUN 17 02/17/2019 0935   BUN 15.3 09/24/2017 0945   CREATININE 1.28 (H) 02/17/2019 0935   CREATININE 1.22 (H) 12/29/2018 0900   CREATININE 0.8 09/24/2017 0945      Component Value Date/Time   CALCIUM 8.7 (L) 02/17/2019 0935   CALCIUM 9.1 09/24/2017 0945   ALKPHOS 66 02/17/2019 0935   ALKPHOS 52 09/24/2017 0945   AST 19 02/17/2019 0935   AST 26 12/29/2018 0900   AST 16 09/24/2017 0945  ALT 14 02/17/2019 0935   ALT 21 12/29/2018 0900   ALT 12 09/24/2017 0945   BILITOT 0.2 (L) 02/17/2019 0935   BILITOT 0.2 (L) 12/29/2018 0900   BILITOT 1.13 09/24/2017 0945       RADIOGRAPHIC STUDIES: I have personally reviewed the radiological images as listed and agreed with the findings in the report. Ct Abdomen Pelvis W Contrast  Result Date: 02/09/2019 CLINICAL DATA:  Metastatic left ovarian epithelial carcinoma. Currently undergoing chemotherapy. Restaging. EXAM: CT ABDOMEN AND PELVIS WITH CONTRAST TECHNIQUE: Multidetector CT imaging of the abdomen and pelvis was performed using  the standard protocol following bolus administration of intravenous contrast. CONTRAST:  34m OMNIPAQUE IOHEXOL 300 MG/ML  SOLN COMPARISON:  11/26/2018 FINDINGS: Lower Chest: Increased tiny bilateral pleural effusions. Right pleural catheter remains in place. Hepatobiliary: No hepatic masses identified. Prior cholecystectomy. No evidence of biliary obstruction. Pancreas:  No mass or inflammatory changes. Spleen: Within normal limits in size. Several sub-cm low-attenuation splenic lesions remain stable. Adrenals/Urinary Tract: No masses identified. No evidence of hydronephrosis. Stomach/Bowel: Mild dilatation is proximal and mid small bowel loops is seen with apparent transition point in the anterior lower abdomen, just deep to the anterior abdominal wall. This consistent with a partial small bowel obstruction. Mild diverticulosis is seen involving the sigmoid colon, however there is no evidence of diverticulitis. Vascular/Lymphatic: No pathologically enlarged lymph nodes. No abdominal aortic aneurysm. Aortic atherosclerosis. Reproductive: Prior hysterectomy. Mild ascites is new since previous study. Mild peritoneal thickening in the pelvis and along the paracolic gutters shows no significant change, consistent with peritoneal carcinomatosis. Other: Stable small ventral abdominal wall hernia containing transverse colon. Musculoskeletal:  No suspicious bone lesions identified. IMPRESSION: 1. New mild ascites. Mild peritoneal thickening shows no significant change, but remains consistent with peritoneal carcinomatosis. 2. Increased tiny bilateral pleural effusions. 3. Partial small bowel obstruction with transition point in the anterior lower abdomen, just deep to the anterior abdominal wall. 4. Stable small ventral abdominal wall hernia containing transverse colon. Electronically Signed   By: JEarle GellM.D.   On: 02/09/2019 13:11    All questions were answered. The patient knows to call the clinic with any  problems, questions or concerns. No barriers to learning was detected.  I spent 30 minutes counseling the patient face to face. The total time spent in the appointment was 40 minutes and more than 50% was on counseling and review of test results  NHeath Lark MD 02/17/2019 10:55 AM

## 2019-02-17 NOTE — Assessment & Plan Note (Signed)
I have reviewed the plan of care with the patient again She would like to proceed with further palliative chemotherapy We discussed the risk, benefits, side effects of single agent Taxol Due to her poor tolerance of treatment, I recommend reduced dose every other week She agreed with the plan of care

## 2019-02-17 NOTE — Assessment & Plan Note (Signed)
She has baseline pancytopenia due to repeat treatment with chemotherapy She does not need transfusion support We will proceed with dose modification

## 2019-02-17 NOTE — Assessment & Plan Note (Signed)
She has residual peripheral neuropathy from prior treatment We will proceed with dose adjustment

## 2019-02-17 NOTE — Assessment & Plan Note (Signed)
We had numerous goals of care discussions in the past She understood that treatment goal is palliative She has significant multiple comorbidities and the risks of complications from chemotherapy is high and she would like to proceed with treatment We discussed CODE STATUS She is leaning towards DNR but has not made up her mind We will discuss further next visit

## 2019-02-17 NOTE — Assessment & Plan Note (Signed)
She has mild ascites on exam but not symptomatic I do not recommend paracentesis right now

## 2019-02-19 ENCOUNTER — Telehealth: Payer: Self-pay | Admitting: Hematology and Oncology

## 2019-02-19 NOTE — Telephone Encounter (Signed)
Called regarding schedule °

## 2019-02-21 ENCOUNTER — Other Ambulatory Visit: Payer: Self-pay | Admitting: Cardiology

## 2019-02-26 ENCOUNTER — Telehealth: Payer: Self-pay

## 2019-02-26 ENCOUNTER — Other Ambulatory Visit: Payer: Self-pay | Admitting: *Deleted

## 2019-02-26 ENCOUNTER — Other Ambulatory Visit: Payer: Self-pay

## 2019-02-26 ENCOUNTER — Telehealth (INDEPENDENT_AMBULATORY_CARE_PROVIDER_SITE_OTHER): Payer: Medicare Other | Admitting: Cardiothoracic Surgery

## 2019-02-26 DIAGNOSIS — J90 Pleural effusion, not elsewhere classified: Secondary | ICD-10-CM

## 2019-02-26 NOTE — Telephone Encounter (Signed)
Pt dtr called and requested records be faxed to [place] dept for second opinion. Fax#:  [phone] In basket msg sent to HIM dept.

## 2019-02-26 NOTE — Progress Notes (Signed)
Mrs Gabriel's daughter/Donna states that she is draining pleurX every 4 days Last drainage amounts were 140'ccs-02/22/19 100cc's 02/18/19 110cc's 02/14/19 Mrs Pegg is very discouraged about the "pleural effusion not drying up faster".

## 2019-02-26 NOTE — Progress Notes (Signed)
[address]       [place] [address]             [phone]    Telehealth Visit     Virtual Visit via Telephone Note   This visit type was conducted due to national recommendations for restrictions regarding the COVID-19 Pandemic (e.g. social distancing) in an effort to limit this patient's exposure and mitigate transmission in our community.  Due to her co-morbid illnesses, this patient is at least at moderate risk for complications without adequate follow up.  This format is felt to be most appropriate for this patient at this time.  The patient did not have access to video technology/had technical difficulties with video requiring transitioning to audio format only (telephone).  All issues noted in this document were discussed and addressed.      [facility] Record #[id number] Date of Birth: 04/20/1933  Referring: Collene Gobble, MD Primary Care: Asencion Noble, MD Primary Cardiologist: Rozann Lesches, MD   Chief Complaint:   POST OP FOLLOW UP 06/24/2018 PREOPERATIVE DIAGNOSIS: Recurrent right pleural effusion with history of ovarian carcinoma. POSTOPERATIVE DIAGNOSIS: Recurrent right pleural effusion with history of ovarian carcinoma. SURGICAL PROCEDURE: Placement of right PleurX catheter with ultrasound and fluoroscopic guidance History of Present Illness:        I contacted [name] remotely due to the limitations of the current COVID pandemic on 02/26/2019  at  10:42 AM  verifying that I was speaking to RUHI KOPKE whose birthday is July 08, 1933.   I discussed limitations of the evaluation and management  of patients remotely without  the benefit of physical exam.  The patient was agreeable with proceeding with a remote/ not face to face visit.    The patient continues to drain the Pleurx with assistance of her daughter every 4 days.  She notes that this is ranged from 110 to 140 mL every 4 days.  She had an  abdominal CT scan recently, suggesting partial small bowel obstruction, incidentally very small bilateral pleural effusions were noted  She notes today she has no fever or chills  She has no redness around the Pleurx site      Past Medical History:  Diagnosis Date   Anxiety    Chronic blood loss anemia    03-04-2018 diverticular bleed and rectal bleeding,  transfused 2 units PRBCs 03-08-2018   Colitis    COPD (chronic obstructive pulmonary disease) (HCC)    Dr. Lamonte Sakai   Depression    Diastolic CHF, chronic ([place])    Diverticulosis    Family history of colon cancer    Fibromyalgia    Genetic testing 04/07/2018   MyRisk (35 genes) @ [facility] - No pathogenic mutations detected   GERD (gastroesophageal reflux disease)    Hemorrhoids    Hiatal hernia    Hip pain 07/2018   Right Hip Pain   History of rectal polyps    History of shingles    Hypothyroidism    IBS (irritable bowel syndrome)    Lymphocytic colitis    Dr. Henrene Pastor   Malignant ascites    Admission 06/2017 abdominal s/p parencentesis 07-01-2017 2.5L, 07-08-2016  2.7L, 07-12-2017  1459m   Neuropathy due to chemotherapeutic drug ([facility]    Osteoporosis    Ovarian cancer ([facility]    Chemotherapy - Dr. GAlvy Bimler  Paroxysmal atrial fibrillation ([facility]    Xarelto stopped 03-07-2018 due to lower GI bleed  Pleural effusion    s/p  right thoracentesis, 02-2018 1.3L and 03-17-2018 right thoracentesis 655m , post cxr no residual effusion   Psoriatic arthritis ([facility]    Schatzki's ring 2013   Seasonal allergic rhinitis      Social History   Tobacco Use  Smoking Status Former Smoker   Packs/day: 1.00   Years: 20.00   Pack years: 20.00   Types: Cigarettes   Last attempt to quit: 10/08/1980   Years since quitting: 38.4  Smokeless Tobacco Never Used    Social History   Substance and Sexual Activity  Alcohol Use Not Currently   Comment: rarely, 12-23-15 rarely     Allergies  Allergen  Reactions   Codeine Itching    Current Outpatient Medications  Medication Sig Dispense Refill   albuterol (PROAIR HFA) 108 (90 Base) MCG/ACT inhaler Inhale 2 puffs into the lungs every 6 (six) hours as needed for wheezing or shortness of breath. 1 Inhaler 3   ALPRAZolam (XANAX) 0.25 MG tablet Take 1 tablet (0.25 mg total) by mouth at bedtime as needed for anxiety. 5 tablet 0   amoxicillin (AMOXIL) 500 MG capsule TAKE 4 CAPSULES PO 1 HOUR PRIOR TO DENTAL APPOINTMENT  1   azelastine (ASTELIN) 0.1 % nasal spray Place 2 sprays into both nostrils 2 (two) times daily as needed for allergies.      CARTIA XT 120 MG 24 hr capsule      Cholecalciferol (VITAMIN D) 2000 units CAPS Take 2,000 Units by mouth daily.      dicyclomine (BENTYL) 10 MG capsule Take 1 capsule (10 mg total) by mouth 2 (two) times daily. 60 capsule 1   diltiazem (CARDIZEM CD) 180 MG 24 hr capsule TAKE ONE CAPSULE BY MOUTH EVERY MORNING 90 capsule 3   diltiazem (DILACOR XR) 180 MG 24 hr capsule Take 180 mg by mouth every morning.     diltiazem (TIAZAC) 120 MG 24 hr capsule Take 120 mg by mouth daily. At night time     diphenhydrAMINE (BENADRYL) 25 mg capsule Take 25 mg by mouth daily as needed for itching.      diphenoxylate-atropine (LOMOTIL) 2.5-0.025 MG tablet Take 1 tablet by mouth 4 (four) times daily. (Patient taking differently: Take 1 tablet by mouth 4 (four) times daily as needed for diarrhea or loose stools. ) 360 tablet 1   DULoxetine (CYMBALTA) 60 MG capsule TAKE 1 CAPSULE(60 MG) BY MOUTH DAILY 30 capsule 3   fluticasone (FLONASE) 50 MCG/ACT nasal spray Place 2 sprays into both nostrils 2 (two) times daily as needed (FOR NASAL CONGESTION.).      furosemide (LASIX) 40 MG tablet Take 1 tablet (40 mg total) by mouth daily. 90 tablet 3   halobetasol (ULTRAVATE) 0.05 % cream Apply 1 application topically 2 (two) times daily as needed (psoriasis).      levothyroxine (SYNTHROID, LEVOTHROID) 137 MCG tablet Take  137 mcg by mouth daily before breakfast. For thyroid therapy     Magnesium 400 MG CAPS Take 400 mg by mouth daily.     mirtazapine (REMERON) 15 MG tablet TAKE 1 TABLET(15 MG) BY MOUTH AT BEDTIME (Patient taking differently: Take 15 mg by mouth at bedtime. ) 30 tablet 11   Oxycodone HCl 10 MG TABS Take 1 tablet (10 mg total) by mouth every 6 (six) hours as needed. 60 tablet 0   polyvinyl alcohol (LIQUIFILM TEARS) 1.4 % ophthalmic solution Place 1 drop into both eyes 2 (two) times daily as needed for dry eyes.  potassium chloride SA (K-DUR,KLOR-CON) 20 MEQ tablet TAKE 1 TABLET BY MOUTH DAILY AND TAKE 1 ADDITIONAL TABLET WHEN TAKING EXTRA LASIX DOSE 60 tablet 6   STIOLTO RESPIMAT 2.5-2.5 MCG/ACT AERS INHALE 2 PUFFS INTO THE LUNGS DAILY 1 g 3   XARELTO 20 MG TABS tablet TAKE 1 TABLET BY MOUTH EVERY DAY AFTER SUPPER 90 tablet 3   No current facility-administered medications for this visit.    Facility-Administered Medications Ordered in Other Visits  Medication Dose Route Frequency Provider Last Rate Last Dose   0.9 %  sodium chloride infusion (Manually program via Guardrails IV Fluids)  250 mL Intravenous Once Heath Lark, MD   Stopped at 12/16/18 1500       Physical Exam: Virtual visit    Diagnostic Studies & Laboratory data:     Recent Radiology Findings: Ct Abdomen Pelvis W Contrast  Result Date: 02/09/2019 CLINICAL DATA:  Metastatic left ovarian epithelial carcinoma. Currently undergoing chemotherapy. Restaging. EXAM: CT ABDOMEN AND PELVIS WITH CONTRAST TECHNIQUE: Multidetector CT imaging of the abdomen and pelvis was performed using the standard protocol following bolus administration of intravenous contrast. CONTRAST:  66m OMNIPAQUE IOHEXOL 300 MG/ML  SOLN COMPARISON:  11/26/2018 FINDINGS: Lower Chest: Increased tiny bilateral pleural effusions. Right pleural catheter remains in place. Hepatobiliary: No hepatic masses identified. Prior cholecystectomy. No evidence of  biliary obstruction. Pancreas:  No mass or inflammatory changes. Spleen: Within normal limits in size. Several sub-cm low-attenuation splenic lesions remain stable. Adrenals/Urinary Tract: No masses identified. No evidence of hydronephrosis. Stomach/Bowel: Mild dilatation is proximal and mid small bowel loops is seen with apparent transition point in the anterior lower abdomen, just deep to the anterior abdominal wall. This consistent with a partial small bowel obstruction. Mild diverticulosis is seen involving the sigmoid colon, however there is no evidence of diverticulitis. Vascular/Lymphatic: No pathologically enlarged lymph nodes. No abdominal aortic aneurysm. Aortic atherosclerosis. Reproductive: Prior hysterectomy. Mild ascites is new since previous study. Mild peritoneal thickening in the pelvis and along the paracolic gutters shows no significant change, consistent with peritoneal carcinomatosis. Other: Stable small ventral abdominal wall hernia containing transverse colon. Musculoskeletal:  No suspicious bone lesions identified. IMPRESSION: 1. New mild ascites. Mild peritoneal thickening shows no significant change, but remains consistent with peritoneal carcinomatosis. 2. Increased tiny bilateral pleural effusions. 3. Partial small bowel obstruction with transition point in the anterior lower abdomen, just deep to the anterior abdominal wall. 4. Stable small ventral abdominal wall hernia containing transverse colon. Electronically Signed   By: JEarle GellM.D.   On: 02/09/2019 13:11   Dg Chest 2 View  Result Date: 12/11/2018 CLINICAL DATA:  Pleural effusion, shortness of breath, cough EXAM: CHEST - 2 VIEW COMPARISON:  10/23/2018 FINDINGS: There is hyperinflation of the lungs compatible with COPD. PleurX catheter in place on the right, stable. No pneumothorax or visible effusions. Heart is normal size. Bibasilar scarring. Right Port-A-Cath is unchanged. IMPRESSION: COPD.  Bibasilar scarring. Right  PleurX catheter and Port-A-Cath unchanged. No visible pneumothorax. Electronically Signed   By: KRolm BaptiseM.D.   On: 12/11/2018 11:18  I have independently reviewed the above radiology studies  and reviewed the findings with the patient.  Ct Abdomen Pelvis W Contrast  Result Date: 11/26/2018 CLINICAL DATA:  Ovarian cancer chemotherapy in progress. EXAM: CT ABDOMEN AND PELVIS WITH CONTRAST TECHNIQUE: Multidetector CT imaging of the abdomen and pelvis was performed using the standard protocol following bolus administration of intravenous contrast. CONTRAST:  1067mOMNIPAQUE IOHEXOL 300 MG/ML  SOLN COMPARISON:  09/11/2018 and 09/18/2017. FINDINGS: Lower chest: 3 mm peripheral right lower lobe nodule (series 7, image 10) unchanged from 09/18/2017. Small bore chest tube is seen within a tiny right pleural effusion. Heart size normal. No pericardial effusion. Distal esophagus is grossly unremarkable. Hepatobiliary: Slight marginal irregularity of the liver may be due to known peritoneal disease. Liver is otherwise unremarkable. Common bile duct dilatation is unchanged and likely related to cholecystectomy. Pancreas: Negative. Spleen: Negative. Adrenals/Urinary Tract: Adrenal glands and right kidney are unremarkable. Subcentimeter low-attenuation lesion in the left kidney is too small to characterize. Ureters are decompressed. Bladder is low in volume. Stomach/Bowel: Stomach and small bowel are unremarkable. Appendix is not readily visualized. Unobstructed colon extends into a wide neck supraumbilical midline ventral hernia. Colon is otherwise unremarkable. Vascular/Lymphatic: Atherosclerotic calcification of the aorta without aneurysm. Retroaortic left renal vein. No pathologically enlarged lymph nodes. Reproductive: Hysterectomy.  No adnexal mass. Other: Slight residual peritoneal thickening along the right and left walls of the abdomen and pelvis, overall improved from 09/11/2018. Ascites has resolved.  Musculoskeletal: Postoperative changes in the proximal femur. Degenerative changes spine and. No worrisome lytic or sclerotic lesions. L5 superior endplate compression is unchanged. IMPRESSION: 1. Mild residual peritoneal thickening, overall decreased from 09/11/2018, with resolution of previously seen ascites. 2. Tiny residual right pleural effusion with small bore chest tube in place. 3. Supraumbilical midline ventral hernia has a wide neck and contains unobstructed colon. 4.  Aortic atherosclerosis (ICD10-170.0). Electronically Signed   By: Lorin Picket M.D.   On: 11/26/2018 12:16    I have independently reviewed the above radiology studies  and reviewed the findings with the patient.    Dg Chest 2 View  Result Date: 09/11/2018 CLINICAL DATA:  Shortness of breath. Pleural effusion. EXAM: CHEST - 2 VIEW COMPARISON:  08/14/2018 FINDINGS: Right jugular Port-A-Cath remains in place terminating over the lower SVC, unchanged. A right-sided tunneled pleural catheter catheter is unchanged. The cardiomediastinal silhouette is unchanged with normal heart size. Aortic atherosclerosis is noted. The lungs remain hyperinflated with minimal scarring or atelectasis in the lung bases. No acute airspace consolidation or edema is identified. There is likely a trace right apical pneumothorax, and there is at most a trace right pleural effusion. No acute osseous abnormality is identified. IMPRESSION: Trace right apical pneumothorax and at most a trace right pleural effusion with right pleural catheter in place. No evidence of acute airspace disease. Electronically Signed   By: Logan Bores M.D.   On: 09/11/2018 14:35    Dg Chest 2 View  Result Date: 08/14/2018 CLINICAL DATA:  Follow-up right pleural effusion.  COPD. EXAM: CHEST - 2 VIEW COMPARISON:  07/16/2018 chest radiograph. FINDINGS: Right basilar chest tube terminates in the medial upper right pleural space, unchanged. Right internal jugular MediPort terminates in  the lower third of the SVC. Stable cardiomediastinal silhouette with normal heart size. No convincing pneumothorax. No significant pleural effusion. Hyperinflated lungs. No pulmonary edema. No acute consolidative airspace disease. Stable minimal bibasilar scarring versus atelectasis. IMPRESSION: 1. Stable right chest tube with no appreciable residual pneumothorax. 2. No significant pleural effusion. 3. Stable minimal bibasilar scarring versus atelectasis. 4. Hyperinflated lungs, compatible with the provided history of COPD. Electronically Signed   By: Ilona Sorrel M.D.   On: 08/14/2018 09:40   DCt Abdomen Pelvis W Contrast  Result Date: 07/23/2018 CLINICAL DATA:  Ovarian cancer, diagnosed 07/2015, status post surgery with adjunct chemotherapy. Oral chemotherapy ongoing. Recurrent pleural effusions. EXAM: CT ABDOMEN AND PELVIS WITH CONTRAST TECHNIQUE: Multidetector  CT imaging of the abdomen and pelvis was performed using the standard protocol following bolus administration of intravenous contrast. CONTRAST:  119m OMNIPAQUE IOHEXOL 300 MG/ML  SOLN COMPARISON:  04/21/2018 FINDINGS: Lower chest: Trace right pleural effusion with indwelling pleural drain. Hepatobiliary: Liver is within normal limits. Status post cholecystectomy. No intrahepatic ductal dilatation. Dilated common duct, measuring 11 mm, chronic. Pancreas: Within normal limits. Spleen: Within normal limits. Adrenals/Urinary Tract: Adrenal glands within normal limits. Kidneys are within normal limits.  No hydronephrosis. Bladder is underdistended but unremarkable. Stomach/Bowel: Stomach is within normal limits. No evidence of bowel obstruction. Very mild sigmoid diverticulosis. Vascular/Lymphatic: No evidence of abdominal aortic aneurysm. Atherosclerotic calcifications of the abdominal aorta and branch vessels. No suspicious abdominopelvic lymphadenopathy. Reproductive: Status post hysterectomy and bilateral salpingo-oophorectomy. Other: No  abdominopelvic ascites. Mild peritoneal thickening/nodularity on the left (series 2/images 58-59). Mild soft tissue nodularity along the right pelvic cul-de-sac adjacent to the rectum (series 2/image 58). While equivocal, these findings are worrisome for very mild peritoneal disease. Musculoskeletal: Degenerative changes of the visualized thoracolumbar spine. IMPRESSION: Status post hysterectomy and bilateral salpingo-oophorectomy. Very mild peritoneal thickening/nodularity, equivocal but worrisome for very mild peritoneal disease. Attention on follow-up is suggested. Small right pleural effusion with indwelling pleural drain. Electronically Signed   By: SJulian HyM.D.   On: 07/23/2018 08:37    Recent Lab Findings: Lab Results  Component Value Date   WBC 7.8 02/17/2019   HGB 8.3 (L) 02/17/2019   HCT 28.0 (L) 02/17/2019   PLT 340 02/17/2019   GLUCOSE 113 (H) 02/17/2019   CHOL 147 11/14/2017   ALT 14 02/17/2019   AST 19 02/17/2019   NA 132 (L) 02/17/2019   K 4.1 02/17/2019   CL 96 (L) 02/17/2019   CREATININE 1.28 (H) 02/17/2019   BUN 17 02/17/2019   CO2 29 02/17/2019   TSH 0.388 11/10/2017   INR 1.09 06/24/2018   HGBA1C 5.7 (H) 06/12/2013     Assessment / Plan:   With the continued low level of drainage  with a 4-day interval, I discussed with the patient continuing as we are versus removal of the catheter.  She is agreeable with removal and will make these [facility].   We will follow-up after the removal with a chest x-ray 3 to 4 weeks.  EGrace IsaacMD      [address] [place] [id number] [phone]   Beeper [phone] 02/26/2019 10:42 AM

## 2019-03-03 ENCOUNTER — Other Ambulatory Visit: Payer: Self-pay

## 2019-03-03 ENCOUNTER — Ambulatory Visit (INDEPENDENT_AMBULATORY_CARE_PROVIDER_SITE_OTHER): Payer: Medicare Other | Admitting: Physician Assistant

## 2019-03-03 ENCOUNTER — Other Ambulatory Visit: Payer: Self-pay | Admitting: *Deleted

## 2019-03-03 ENCOUNTER — Other Ambulatory Visit: Payer: Self-pay | Admitting: Physician Assistant

## 2019-03-03 VITALS — BP 124/85 | HR 100 | Temp 99.0°F | Resp 18 | Ht 64.0 in | Wt 185.0 lb

## 2019-03-03 DIAGNOSIS — J90 Pleural effusion, not elsewhere classified: Secondary | ICD-10-CM

## 2019-03-03 DIAGNOSIS — Z9689 Presence of other specified functional implants: Secondary | ICD-10-CM

## 2019-03-03 MED ORDER — DICYCLOMINE HCL 10 MG PO CAPS: 10.0000 mg | ORAL_CAPSULE | Freq: Two times a day (BID) | ORAL | 1 refills | Status: DC

## 2019-03-03 NOTE — Progress Notes (Signed)
HPI:  Patient presents today with complaints of bleeding around her Pleur-x catheter site. Her daughter states they had to change the dressing due to the drainage around the chest tube site.  She drained the catheter yesterday and got 175 cc and was not bloody.  She has stopped her Xarelto as scheduled due to scheduled removal of Pleur-x catheter tomorrow.  Current Outpatient Medications  Medication Sig Dispense Refill  . albuterol (PROAIR HFA) 108 (90 Base) MCG/ACT inhaler Inhale 2 puffs into the lungs every 6 (six) hours as needed for wheezing or shortness of breath. 1 Inhaler 3  . ALPRAZolam (XANAX) 0.25 MG tablet Take 1 tablet (0.25 mg total) by mouth at bedtime as needed for anxiety. 5 tablet 0  . amoxicillin (AMOXIL) 500 MG capsule Take 2,000 mg by mouth See admin instructions. Take 4 capsules (2000 mg) by mouth 1 hour prior to dental procedures  1  . azelastine (ASTELIN) 0.1 % nasal spray Place 2 sprays into both nostrils 2 (two) times daily as needed for allergies.     Marland Kitchen CARTIA XT 120 MG 24 hr capsule Take 120 mg by mouth at bedtime.     . Cholecalciferol (VITAMIN D) 2000 units CAPS Take 2,000 Units by mouth daily.     Marland Kitchen dicyclomine (BENTYL) 10 MG capsule Take 1 capsule (10 mg total) by mouth 2 (two) times daily. 60 capsule 1  . diltiazem (CARDIZEM CD) 180 MG 24 hr capsule TAKE ONE CAPSULE BY MOUTH EVERY MORNING (Patient taking differently: Take 180 mg by mouth daily. ) 90 capsule 3  . diphenhydrAMINE (BENADRYL) 25 mg capsule Take 25 mg by mouth every 8 (eight) hours as needed for itching.     . diphenoxylate-atropine (LOMOTIL) 2.5-0.025 MG tablet Take 1 tablet by mouth 4 (four) times daily. (Patient taking differently: Take 1 tablet by mouth 4 (four) times daily as needed for diarrhea or loose stools. ) 360 tablet 1  . DULoxetine (CYMBALTA) 60 MG capsule TAKE 1 CAPSULE(60 MG) BY MOUTH DAILY (Patient taking differently: Take 60 mg by mouth daily. ) 30 capsule 3  . fluticasone (FLONASE) 50  MCG/ACT nasal spray Place 2 sprays into both nostrils 2 (two) times daily as needed (FOR NASAL CONGESTION.).     Marland Kitchen furosemide (LASIX) 40 MG tablet Take 1 tablet (40 mg total) by mouth daily. 90 tablet 3  . halobetasol (ULTRAVATE) 0.05 % cream Apply 1 application topically 2 (two) times daily as needed (psoriasis).     Marland Kitchen levothyroxine (SYNTHROID, LEVOTHROID) 137 MCG tablet Take 137 mcg by mouth daily before breakfast. For thyroid therapy    . Magnesium 400 MG CAPS Take 400 mg by mouth 2 (two) times a day.     . mirtazapine (REMERON) 15 MG tablet TAKE 1 TABLET(15 MG) BY MOUTH AT BEDTIME (Patient taking differently: Take 15 mg by mouth at bedtime. ) 30 tablet 11  . Oxycodone HCl 10 MG TABS Take 1 tablet (10 mg total) by mouth every 6 (six) hours as needed. (Patient taking differently: Take 10 mg by mouth every 6 (six) hours as needed (pain). ) 60 tablet 0  . polyvinyl alcohol (LIQUIFILM TEARS) 1.4 % ophthalmic solution Place 1 drop into both eyes 2 (two) times daily as needed for dry eyes.     . potassium chloride SA (K-DUR,KLOR-CON) 20 MEQ tablet TAKE 1 TABLET BY MOUTH DAILY AND TAKE 1 ADDITIONAL TABLET WHEN TAKING EXTRA LASIX DOSE (Patient taking differently: Take 20 mEq by mouth daily. ) 60 tablet 6  .  STIOLTO RESPIMAT 2.5-2.5 MCG/ACT AERS INHALE 2 PUFFS INTO THE LUNGS DAILY (Patient taking differently: Inhale 2 puffs into the lungs daily. ) 1 g 3  . XARELTO 20 MG TABS tablet TAKE 1 TABLET BY MOUTH EVERY DAY AFTER SUPPER (Patient not taking: High risk med: Anticoagulant.  Crushed Xarelto can be given down a G-tube but NOT a J-Tube.) 90 tablet 3   No current facility-administered medications for this visit.    Facility-Administered Medications Ordered in Other Visits  Medication Dose Route Frequency Provider Last Rate Last Dose  . 0.9 %  sodium chloride infusion (Manually program via Guardrails IV Fluids)  250 mL Intravenous Once Heath Lark, MD   Stopped at 12/16/18 1500    Physical Exam:  BP  124/85 (BP Location: Left Arm, Patient Position: Sitting, Cuff Size: Normal)   Pulse 100   Temp 99 F (37.2 C) (Oral)   Resp 18   Ht 5\' 4"  (1.626 m)   Wt 185 lb (83.9 kg)   SpO2 95% Comment: ON 2L O2  BMI 31.76 kg/m   Gen: no apparent distress, on oxygen Heart: RRR Lungs: CTA bilaterally Pleurx site- there was blood on dressing site, there is a clot around the tubing, but no signs of active bleeding   A/P:  1. Removed clot from around Pleur-x cather... there was no signs of active bleeding. . Pleurx catheter was flushed without difficulty 2. Will remove Pleur-x catheter today, RTC in 1 week with repeat CXR   Ellwood Handler, PA-C [facility] [phone]

## 2019-03-03 NOTE — Telephone Encounter (Signed)
Dicyclomine refilled as pharmacy requested. 

## 2019-03-03 NOTE — Progress Notes (Signed)
[name] [id number] 01-Jul-1933   Pre Procedure Diagnosis- Right sided Recurrent Pleural effusion Post Procedure Diagnosis- Right sided Recurrent Pleural effusion  Procedure: Removal of Right sided Pleur-x catheter  The patient's right side was prepped and draped in sterile fashion.  Approximately 10 ml of 1% lidocaine was instilled around Pleur-x catheter to achieve local anesthesia.  Blunt dissection was performed to remove adhesions around cuff of Pleur-x catheter.  The catheter was removed without difficulty once free.  The incision site was closed with 2 interuppted 3-0 Nylon Sutures.  Patient tolerated procedure without difficulty.   There was no blood loss  Ellwood Handler, PA-C

## 2019-03-04 ENCOUNTER — Encounter (HOSPITAL_COMMUNITY): Admission: RE | Payer: Self-pay | Source: Home / Self Care

## 2019-03-04 ENCOUNTER — Telehealth: Payer: Self-pay | Admitting: Hematology and Oncology

## 2019-03-04 ENCOUNTER — Telehealth: Payer: Self-pay | Admitting: *Deleted

## 2019-03-04 ENCOUNTER — Ambulatory Visit (HOSPITAL_COMMUNITY): Admission: RE | Admit: 2019-03-04 | Payer: Medicare Other | Source: Home / Self Care | Admitting: Cardiothoracic Surgery

## 2019-03-04 SURGERY — REMOVAL, CLOSED DRAINAGE CATHETER SYSTEM, PLEURAL
Anesthesia: Monitor Anesthesia Care | Laterality: Right

## 2019-03-04 NOTE — Telephone Encounter (Signed)
Faxed Genetic testing report to Dr. Leda Quail @ [facility] @ [phone]

## 2019-03-04 NOTE — Telephone Encounter (Signed)
Per request I have faxed records to [facility]

## 2019-03-05 ENCOUNTER — Inpatient Hospital Stay: Payer: Medicare Other

## 2019-03-05 ENCOUNTER — Inpatient Hospital Stay (HOSPITAL_BASED_OUTPATIENT_CLINIC_OR_DEPARTMENT_OTHER): Payer: Medicare Other | Admitting: Hematology and Oncology

## 2019-03-05 ENCOUNTER — Encounter: Payer: Self-pay | Admitting: Hematology and Oncology

## 2019-03-05 ENCOUNTER — Other Ambulatory Visit: Payer: Self-pay

## 2019-03-05 VITALS — BP 138/60 | HR 93 | Temp 98.2°F | Resp 18 | Ht 64.0 in | Wt 189.6 lb

## 2019-03-05 DIAGNOSIS — J910 Malignant pleural effusion: Secondary | ICD-10-CM

## 2019-03-05 DIAGNOSIS — K5900 Constipation, unspecified: Secondary | ICD-10-CM

## 2019-03-05 DIAGNOSIS — C562 Malignant neoplasm of left ovary: Secondary | ICD-10-CM

## 2019-03-05 DIAGNOSIS — D61810 Antineoplastic chemotherapy induced pancytopenia: Secondary | ICD-10-CM

## 2019-03-05 DIAGNOSIS — E440 Moderate protein-calorie malnutrition: Secondary | ICD-10-CM

## 2019-03-05 DIAGNOSIS — G620 Drug-induced polyneuropathy: Secondary | ICD-10-CM

## 2019-03-05 DIAGNOSIS — I482 Chronic atrial fibrillation, unspecified: Secondary | ICD-10-CM

## 2019-03-05 DIAGNOSIS — D638 Anemia in other chronic diseases classified elsewhere: Secondary | ICD-10-CM

## 2019-03-05 DIAGNOSIS — D61818 Other pancytopenia: Secondary | ICD-10-CM

## 2019-03-05 DIAGNOSIS — I700 Atherosclerosis of aorta: Secondary | ICD-10-CM

## 2019-03-05 DIAGNOSIS — T451X5A Adverse effect of antineoplastic and immunosuppressive drugs, initial encounter: Secondary | ICD-10-CM

## 2019-03-05 DIAGNOSIS — Z79899 Other long term (current) drug therapy: Secondary | ICD-10-CM

## 2019-03-05 DIAGNOSIS — Z7901 Long term (current) use of anticoagulants: Secondary | ICD-10-CM

## 2019-03-05 DIAGNOSIS — G893 Neoplasm related pain (acute) (chronic): Secondary | ICD-10-CM

## 2019-03-05 DIAGNOSIS — J90 Pleural effusion, not elsewhere classified: Secondary | ICD-10-CM

## 2019-03-05 LAB — COMPREHENSIVE METABOLIC PANEL
ALT: 14 U/L (ref 0–44)
AST: 21 U/L (ref 15–41)
Albumin: 2.6 g/dL — ABNORMAL LOW (ref 3.5–5.0)
Alkaline Phosphatase: 62 U/L (ref 38–126)
Anion gap: 7 (ref 5–15)
BUN: 16 mg/dL (ref 8–23)
CO2: 31 mmol/L (ref 22–32)
Calcium: 8.2 mg/dL — ABNORMAL LOW (ref 8.9–10.3)
Chloride: 96 mmol/L — ABNORMAL LOW (ref 98–111)
Creatinine, Ser: 1.15 mg/dL — ABNORMAL HIGH (ref 0.44–1.00)
GFR calc Af Amer: 51 mL/min — ABNORMAL LOW (ref >60)
GFR calc non Af Amer: 44 mL/min — ABNORMAL LOW (ref >60)
Glucose, Bld: 132 mg/dL — ABNORMAL HIGH (ref 70–99)
Potassium: 3.7 mmol/L (ref 3.5–5.1)
Sodium: 134 mmol/L — ABNORMAL LOW (ref 135–145)
Total Bilirubin: 0.2 mg/dL — ABNORMAL LOW (ref 0.3–1.2)
Total Protein: 6.7 g/dL (ref 6.5–8.1)

## 2019-03-05 LAB — CBC WITH DIFFERENTIAL/PLATELET
Abs Immature Granulocytes: 0.16 10*3/uL — ABNORMAL HIGH (ref 0.00–0.07)
Basophils Absolute: 0 10*3/uL (ref 0.0–0.1)
Basophils Relative: 0 %
Eosinophils Absolute: 0.1 10*3/uL (ref 0.0–0.5)
Eosinophils Relative: 2 %
HCT: 26.1 % — ABNORMAL LOW (ref 36.0–46.0)
Hemoglobin: 7.6 g/dL — ABNORMAL LOW (ref 12.0–15.0)
Immature Granulocytes: 2 %
Lymphocytes Relative: 14 %
Lymphs Abs: 1.2 10*3/uL (ref 0.7–4.0)
MCH: 23.8 pg — ABNORMAL LOW (ref 26.0–34.0)
MCHC: 29.1 g/dL — ABNORMAL LOW (ref 30.0–36.0)
MCV: 81.8 fL (ref 80.0–100.0)
Monocytes Absolute: 1.1 10*3/uL — ABNORMAL HIGH (ref 0.1–1.0)
Monocytes Relative: 13 %
Neutro Abs: 5.5 10*3/uL (ref 1.7–7.7)
Neutrophils Relative %: 69 %
Platelets: 293 10*3/uL (ref 150–400)
RBC: 3.19 MIL/uL — ABNORMAL LOW (ref 3.87–5.11)
RDW: 17.7 % — ABNORMAL HIGH (ref 11.5–15.5)
WBC: 8.1 10*3/uL (ref 4.0–10.5)
nRBC: 0 % (ref 0.0–0.2)

## 2019-03-05 LAB — PREPARE RBC (CROSSMATCH)

## 2019-03-05 LAB — SAMPLE TO BLOOD BANK

## 2019-03-05 MED ORDER — DIPHENHYDRAMINE HCL 25 MG PO CAPS
25.0000 mg | ORAL_CAPSULE | Freq: Once | ORAL | Status: AC
  Administered 2019-03-05: 10:00:00 25 mg via ORAL

## 2019-03-05 MED ORDER — SODIUM CHLORIDE 0.9% FLUSH
3.0000 mL | INTRAVENOUS | Status: DC | PRN
  Filled 2019-03-05: qty 10

## 2019-03-05 MED ORDER — ACETAMINOPHEN 325 MG PO TABS
650.0000 mg | ORAL_TABLET | Freq: Once | ORAL | Status: AC
  Administered 2019-03-05: 10:00:00 650 mg via ORAL

## 2019-03-05 MED ORDER — SODIUM CHLORIDE 0.9% FLUSH
10.0000 mL | INTRAVENOUS | Status: AC | PRN
  Administered 2019-03-05: 12:00:00 10 mL
  Filled 2019-03-05: qty 10

## 2019-03-05 MED ORDER — HEPARIN SOD (PORK) LOCK FLUSH 100 UNIT/ML IV SOLN
500.0000 [IU] | Freq: Every day | INTRAVENOUS | Status: AC | PRN
  Administered 2019-03-05: 12:00:00 500 [IU]
  Filled 2019-03-05: qty 5

## 2019-03-05 MED ORDER — OXYCODONE HCL 15 MG PO TABS: 15.0000 mg | ORAL_TABLET | ORAL | 0 refills | Status: DC | PRN

## 2019-03-05 MED ORDER — SODIUM CHLORIDE 0.9% FLUSH
10.0000 mL | Freq: Once | INTRAVENOUS | Status: AC
  Administered 2019-03-05: 10 mL
  Filled 2019-03-05: qty 10

## 2019-03-05 MED ORDER — DIPHENHYDRAMINE HCL 25 MG PO CAPS
ORAL_CAPSULE | ORAL | Status: AC
  Filled 2019-03-05: qty 1

## 2019-03-05 MED ORDER — ACETAMINOPHEN 325 MG PO TABS
ORAL_TABLET | ORAL | Status: AC
  Filled 2019-03-05: qty 2

## 2019-03-05 MED ORDER — SODIUM CHLORIDE 0.9% IV SOLUTION
250.0000 mL | Freq: Once | INTRAVENOUS | Status: AC
  Administered 2019-03-05: 250 mL via INTRAVENOUS
  Filled 2019-03-05: qty 250

## 2019-03-05 NOTE — Assessment & Plan Note (Signed)
She is symptomatic from her anemia with shortness of breath and occasional dizziness We will hold her treatment today We discussed some of the risks, benefits, and alternatives of blood transfusions. The patient is symptomatic from anemia and the hemoglobin level is critically low.  Some of the side-effects to be expected including risks of transfusion reactions, chills, infection, syndrome of volume overload and risk of hospitalization from various reasons and the patient is willing to proceed and went ahead to sign consent today. I will give her a unit of blood

## 2019-03-05 NOTE — Patient Instructions (Signed)
Blood Transfusion, Adult, Care After This sheet gives you information about how to care for yourself after your procedure. Your doctor may also give you more specific instructions. If you have problems or questions, contact your doctor. Follow these instructions at home:   Take over-the-counter and prescription medicines only as told by your doctor.  Go back to your normal activities as told by your doctor.  Follow instructions from your doctor about how to take care of the area where an IV tube was put into your vein (insertion site). Make sure you: ? Wash your hands with soap and water before you change your bandage (dressing). If there is no soap and water, use hand sanitizer. ? Change your bandage as told by your doctor.  Check your IV insertion site every day for signs of infection. Check for: ? More redness, swelling, or pain. ? More fluid or blood. ? Warmth. ? Pus or a bad smell. Contact a doctor if:  You have more redness, swelling, or pain around the IV insertion site.  You have more fluid or blood coming from the IV insertion site.  Your IV insertion site feels warm to the touch.  You have pus or a bad smell coming from the IV insertion site.  Your pee (urine) turns pink, red, or brown.  You feel weak after doing your normal activities. Get help right away if:  You have signs of a serious allergic or body defense (immune) system reaction, including: ? Itchiness. ? Hives. ? Trouble breathing. ? Anxiety. ? Pain in your chest or lower back. ? Fever, flushing, and chills. ? Fast pulse. ? Rash. ? Watery poop (diarrhea). ? Throwing up (vomiting). ? Dark pee. ? Serious headache. ? Dizziness. ? Stiff neck. ? Yellow color in your face or the white parts of your eyes (jaundice). Summary  After a blood transfusion, return to your normal activities as told by your doctor.  Every day, check for signs of infection where the IV tube was put into your vein.  Some  signs of infection are warm skin, more redness and pain, more fluid or blood, and pus or a bad smell where the needle went in.  Contact your doctor if you feel weak or have any unusual symptoms. This information is not intended to replace advice given to you by your health care provider. Make sure you discuss any questions you have with your health care provider. Document Released: 10/15/2014 Document Revised: 05/18/2016 Document Reviewed: 05/18/2016 Elsevier Interactive Patient Education  2019 [employer]   Coronavirus (COVID-19) Are you at risk?  Are you at risk for the Coronavirus (COVID-19)?  To be considered HIGH RISK for Coronavirus (COVID-19), you have to meet the following criteria:  . Traveled to [place], [place], [place], [place] or [place]; or in the [place] to [place], [place], [place], or [place]; and have fever, cough, and shortness of breath within the last 2 weeks of travel OR . Been in close contact with a person diagnosed with COVID-19 within the last 2 weeks and have fever, cough, and shortness of breath . IF YOU DO NOT MEET THESE CRITERIA, YOU ARE CONSIDERED LOW RISK FOR COVID-19.  What to do if you are HIGH RISK for COVID-19?  . If you are having a medical emergency, call 911. . Seek medical care right away. Before you go to a doctor's office, urgent care or emergency department, call ahead and tell them about your recent travel, contact with someone diagnosed with   COVID-19, and your symptoms. You should receive instructions from your physician's office regarding next steps of care.  . When you arrive at healthcare provider, tell the healthcare staff immediately you have returned from visiting [place], [place], [place], [place] or [place]; or traveled in the [place] to [place], [place], [place], or [place]; in the last two weeks or you have been in close contact with a person diagnosed with COVID-19 in the last 2 weeks.   . Tell the health care  staff about your symptoms: fever, cough and shortness of breath. . After you have been seen by a medical provider, you will be either: o Tested for (COVID-19) and discharged home on quarantine except to seek medical care if symptoms worsen, and asked to  - Stay home and avoid contact with others until you get your results (4-5 days)  - Avoid travel on public transportation if possible (such as bus, train, or airplane) or o Sent to the Emergency Department by EMS for evaluation, COVID-19 testing, and possible admission depending on your condition and test results.  What to do if you are LOW RISK for COVID-19?  Reduce your risk of any infection by using the same precautions used for avoiding the common cold or flu:  . Wash your hands often with soap and warm water for at least 20 seconds.  If soap and water are not readily available, use an alcohol-based hand sanitizer with at least 60% alcohol.  . If coughing or sneezing, cover your mouth and nose by coughing or sneezing into the elbow areas of your shirt or coat, into a tissue or into your sleeve (not your hands). . Avoid shaking hands with others and consider head nods or verbal greetings only. . Avoid touching your eyes, nose, or mouth with unwashed hands.  . Avoid close contact with people who are sick. . Avoid places or events with large numbers of people in one location, like concerts or sporting events. . Carefully consider travel plans you have or are making. . If you are planning any travel outside or inside the [place], visit the CDC's Travelers' Health webpage for the latest health notices. . If you have some symptoms but not all symptoms, continue to monitor at home and seek medical attention if your symptoms worsen. . If you are having a medical emergency, call 911.   ADDITIONAL HEALTHCARE OPTIONS FOR PATIENTS  [facility] / e-Visit: [email or web address]         [facility]:  [phone]  [facility]: [phone]                   [facility]: [phone]   

## 2019-03-05 NOTE — Assessment & Plan Note (Signed)
Her Pleurx catheter was recently removed She will continue close monitoring Examination today showed satisfactorily breath sounds except for mild wheezes, likely due to her underlying COPD She will continue oxygen therapy

## 2019-03-05 NOTE — Progress Notes (Signed)
[name] OFFICE PROGRESS NOTE  Patient Care Team: Asencion Noble, MD as PCP - General (Internal Medicine) Satira Sark, MD as PCP - Cardiology (Cardiology) Ahmed Prima, Neta Ehlers as Physician Assistant (Physician Assistant) Heath Lark, MD as Consulting Physician (Hematology and Oncology) Collene Gobble, MD as Consulting Physician (Pulmonary Disease)  ASSESSMENT & PLAN:  Left ovarian epithelial cancer [name]) Unfortunately, due to severe anemia, I will put her treatment on hold this weekend will resume treatment as scheduled on June 8 We will transfuse her today due to symptomatic anemia  Anemia, chronic disease She is symptomatic from her anemia with shortness of breath and occasional dizziness We will hold her treatment today We discussed some of the risks, benefits, and alternatives of blood transfusions. The patient is symptomatic from anemia and the hemoglobin level is critically low.  Some of the side-effects to be expected including risks of transfusion reactions, chills, infection, syndrome of volume overload and risk of hospitalization from various reasons and the patient is willing to proceed and went ahead to sign consent today. I will give her a unit of blood  Cancer associated pain She has poorly controlled pain I recommend increasing oxycodone to 50 mg as needed I will assess pain control in her next visit I warned her about risk of nausea, constipation and sedation  Chronic atrial fibrillation She is on anticoagulation therapy She has recent bleeding We will transfuse her today  Pleural effusion Her Pleurx catheter was recently removed She will continue close monitoring Examination today showed satisfactorily breath sounds except for mild wheezes, likely due to her underlying COPD She will continue oxygen therapy   Orders Placed This Encounter  Procedures  . Type and screen    Standing Status:   Future    Number of Occurrences:   1     Standing Expiration Date:   03/04/2020  . Prepare RBC    Standing Status:   Standing    Number of Occurrences:   1    Order Specific Question:   # of Units    Answer:   1 unit    Order Specific Question:   Transfusion Indications    Answer:   Symptomatic Anemia    Order Specific Question:   Special Requirements    Answer:   Irradiated    Order Specific Question:   If emergent release call blood bank    Answer:   Not emergent release    INTERVAL HISTORY: Please see below for problem oriented charting. She returns for chemotherapy and follow-up Her Pleurx catheter was recently removed, complicated by bleeding She complained of mild shortness of breath and dizziness She denies hematuria or hematochezia Denies abdominal bloating or changes in bowel habits She complained of severe joint pain recently  SUMMARY OF ONCOLOGIC HISTORY: Oncology History   High grade serous ER 90%, PR 0% BRCA 1: no loss of expression MMR normal  R1 resection, platinum refractory, progressed on Femara, carboplatin and Gemzar     Left ovarian epithelial cancer ([place])   02/18/2016 Tumor Marker    Patient's tumor was tested for the following markers: CA125 Results of the tumor marker test revealed 45    05/22/2016 Tumor Marker    Patient's tumor was tested for the following markers: CA125 Results of the tumor marker test revealed 53    05/22/2016 Imaging    Outside pelvic US showed 4.1 cm adnexa mass    06/24/2017 Imaging    Ct abdomen and pelvis:  1. Interim finding of moderate ascites within the abdomen and pelvis with additional finding of diffuse nodular infiltration of the omentum and anterior mesenteric fat, the appearance would be consistent with peritoneal carcinomatosis/metastatic disease. Increasing retroperitoneal and upper abdominal adenopathy. 2. Re- demonstrated 3.8 cm cyst in the right adnexa. Enlarging soft tissue density in the left adnexa now with possible cystic component posteriorly. In  light of the above findings, concern is for ovarian neoplasm. Correlation with pelvic ultrasound recommended. 3. Small right-sided pleural effusion, new since prior study 4. Stable hypodense splenic lesions since 2017.     06/25/2017 Imaging    US pelvis: 2.9 cm simple appearing cyst in the right ovary. Left ovary grossly unremarkable. Large volume ascites in the pelvis    06/30/2017 - 07/01/2017 Hospital Admission    She was admitted for evaluation of abdominal pain and ascites    07/01/2017 Pathology Results    PERITONEAL/ASCITIC FLUID(SPECIMEN 1 OF 1 COLLECTED 07/01/17): - POORLY DIFFERENTIATED CARCINOMA; SEE COMMENT Source Peritoneal/Ascitic Fluid, (specimen 1 of 1 collected 07/01/17) Gross Specimen: Received is/are 1000 cc's of brownish fluid. (BS:bs) Prepared: # Smears: 0 # Concentration Technique Slides (i.e. ThinPrep): 1 # Cell Block: 1 Additional Studies: Also received Hematology slide - [id number]. Comment The tumor cells are positive for cytokeratin 7 and Pax-8 but negative for cytokeratin 20, CDX-2, GATA-3, Napsin-A and TTF-1. Based on the immunoprofile a gynecology primary is favored    07/01/2017 Procedure    Successful ultrasound-guided diagnostic and therapeutic paracentesis yielding 2.5 liters of peritoneal fluid    07/07/2017 - 07/09/2017 Hospital Admission    She was admitted for management of malignant ascites    07/08/2017 Procedure    Successful ultrasound-guided therapeutic paracentesis yielding 2.7 liters liters of peritoneal fluid    07/12/2017 Procedure    Successful ultrasound-guided paracentesis yielding 1450 mL of peritoneal fluid    07/18/2017 - 07/24/2017 Hospital Admission    She was admitted for expedited treatment    07/18/2017 Tumor Marker    Patient's tumor was tested for the following markers: CA125 Results of the tumor marker test revealed 1941    07/19/2017 - 02/04/2018 Chemotherapy    The patient had 6 cycles of carboplatin & Taxol for  chemotherapy treatment, followed by 3 more cycles of carboplatin only     07/19/2017 - 02/04/2018 Chemotherapy    The patient had carboplatin and taxol    08/06/2017 Procedure    Successful ultrasound-guided therapeutic paracentesis yielding 2.6 liters of peritoneal fluid.    08/09/2017 Tumor Marker    Patient's tumor was tested for the following markers: CA125 Results of the tumor marker test revealed 1665    08/15/2017 Tumor Marker    Patient's tumor was tested for the following markers: CA125 Results of the tumor marker test revealed 937.9    08/20/2017 Imaging    ECHO: Normal LV size with EF 60-65%. Normal RV size and systolic function. No significant valvular abnormalities.    09/18/2017 Imaging    Chest Impression:  1. No evidence thoracic metastasis. 2. Interval increase and RIGHT pleural effusion.  Abdomen / Pelvis Impression:  1. Interval decrease in intraperitoneal free fluid. 2. Interval decrease in omental nodularity in the LEFT ventral peritoneal space. 3. Interval decrease in nodularity associated with the LEFT ovary. 4. Cystic portion of the RIGHT ovary is increased mildly in size.    09/20/2017 Tumor Marker    Patient's tumor was tested for the following markers: CA125 Results of the tumor marker test revealed 347  10/14/2017 Tumor Marker    Patient's tumor was tested for the following markers: CA125 Results of the tumor marker test revealed 307.4    11/04/2017 Tumor Marker    Patient's tumor was tested for the following markers: CA125 Results of the tumor marker test revealed 262.5    11/28/2017 Imaging    1. Interval decrease in right pleural effusion with resolution of right atelectasis seen previously. 2. New small left pleural effusion, symmetric to the right. 3. No intraperitoneal free fluid on the current study. 4. Continued further decrease in left omental disease, appearing less confluent today than on the prior study. 5. Left ovary remains normal  in appearance today and the right adnexal cystic lesion is decreased in size compared to prior study. 6. 14 mm pancreatic cyst is unchanged. Continued attention on follow-up imaging recommended. 7. Aortic Atherosclerois (ICD10-170.0)    12/13/2017 Tumor Marker    Patient's tumor was tested for the following markers: CA125 Results of the tumor marker test revealed 197.7    01/03/2018 Tumor Marker    Patient's tumor was tested for the following markers: CA125 Results of the tumor marker test revealed 183.1    01/14/2018 Tumor Marker    Patient's tumor was tested for the following markers: CA125 Results of the tumor marker test revealed 177.4    02/04/2018 Tumor Marker    Patient's tumor was tested for the following markers: CA125 Results of the tumor marker test revealed 168.5    02/25/2018 Imaging    1. Omental carcinomatosis appears qualitatively stable to slightly decreased. Stable mild peritoneal thickening in the paracolic gutters. Stable right adnexal cyst. No ascites. No new or progressive metastatic disease in the abdomen or pelvis. 2. Small dependent right pleural effusion is increased. 3. Cystic pancreatic body lesion is decreased and now subcentimeter in size, suggesting a benign lesion. 4. Aortic Atherosclerosis (ICD10-I70.0).    03/03/2018 - 03/07/2018 Hospital Admission    She was hospitalized for GI bleed requiring blood transfusions. Xarelto was placed on hold    03/07/2018 PET scan    1. Persistent hazy omental interstitial nodularity but no hypermetabolism or discrete measurable nodules. No abdominal ascites. 2. No findings for metastatic disease involving the chest. 3. Moderate-sized right pleural effusion and small left pleural effusion.     03/20/2018 Pathology Results    1. Ovary and fallopian tube, right - OVARY AND FALLOPIAN TUBE INVOLVED BY SEROUS CARCINOMA. - PARATUBAL CYST. 2. Uterus +/- tubes/ovaries, neoplastic, cervix, left ovary and fallopian tube - LEFT  OVARY: HIGH GRADE SEROUS CARCINOMA WITH TREATMENT EFFECT, SPANNING 2.5 CM. CARCINOMA INVOLVES OVARIAN SURFACE. SEE ONCOLOGY TABLE. - LEFT FALLOPIAN TUBE: INVOLVED BY SEROUS CARCINOMA. - UTERUS: -ENDOMETRIUM: INACTIVE ENDOMETRIUM. NO HYPERPLASIA OR MALIGNANCY. -MYOMETRIUM: UNREMARKABLE. NO MALIGNANCY. -SEROSA: INVOLVED BY SEROUS CARCINOMA. - CERVIX: ENDOCERVICAL POLYP. NO MALIGNANCY. 3. Omentum, resection for tumor - INVOLVED BY SEROUS CARCINOMA. 4. Soft tissue, biopsy, mesenteric nodule - INVOLVED BY SEROUS CARCINOMA. Microscopic Comment 2. OVARY or FALLOPIAN TUBE or PRIMARY PERITONEUM: Procedure: Total hysterectomy and bilateral salpingo-oophorectomy. Omentectomy. Mesenteric lymph node biopsy. Specimen Integrity: Intact. Tumor Site: Left ovary. Ovarian Surface Involvement (required only if applicable): Present. Fallopian Tube Surface Involvement (required only if applicable): Present, bilateral. Tumor Size: 2.5 cm. Histologic Type: High grade serous carcinoma. Histologic Grade: High grade. Implants (required for advanced stage serous/seromucinous borderline tumors only): N/A. Other Tissue/ Organ Involvement: Bilateral fallopian tubes, right ovary, uterine serosa, omentum. Largest Extrapelvic Peritoneal Focus (required only if applicable): Microscopic, estimated 0.5 cm (omentum). Peritoneal/Ascitic Fluid:  Prior Positive ([id number]). Treatment Effect (required only for high-grade serous carcinomas): Present in left ovary. CRS2. Regional Lymph Nodes: No lymph nodes submitted/identified. Pathologic Stage Classification (pTNM, AJCC 8th Edition): ypT3b, ypNX Representative Tumor Block: 1A, 1B, 87F, 62F. Comment(s): The right ovary has only surface deposits with a large paratubal cyst. The left ovary has intraparenchymal tumor with associated treatment effect. Thus the tumor location is classified as a left ovarian primary.    03/20/2018 Surgery    Procedure(s) Performed:  1. Exploratory  laparotomy with total hysterectomy and bilateral salpingo-oophorectomy 2. Infragastic Omentectomy  3. Debulking to <1cm gross residual disease   Surgeon: Mart Piggs, MD  Specimens: Uterus Cervix, Bilateral tubes / ovaries and omentum. Mesenteric nodule.  Operative Findings: Debulked to gross residual disease <1cm; however there is miliary disease in multiple locations including the majority of the abdominal peritoneum (anterior abdominal wall, bilateral gutters), diaphragm (Right>left), majority of small bowel mesentary. Normal appendix. Normal small uterus. Right ovary with a cystic lesion ~3cm, some adhesive disease of right adnexa to rectum/sigmoid. Gross omental disease, which was resected with the omentectomy. Smooth liver surface, but again, diaphragmatic disease noted.       03/20/2018 Genetic Testing    Patient has genetic testing done for ER/PR. Results revealed patient has ER: 90%, PR 0%.     03/31/2018 Tumor Marker    Patient's tumor was tested for the following markers: CA125 Results of the tumor marker test revealed 215.8     Genetic Testing    Patient has genetic testing done for BRCA 1. Results revealed patient has the following: BRCA 1: no loss of expression.     Genetic Testing    Patient has genetic testing done for MMR . Results revealed patient has the following:  MMR: normal    03/31/2018 Genetic Testing    Patient has genetic testing done for BRCA1/2. Results revealed patient has no actionable mutations. She is found to have [place] genetic change of unknown significance    04/21/2018 Imaging    1. No definite findings of residual or recurrent metastatic disease in the abdomen or pelvis status post interval TAHBSO and omentectomy. Stable minimal thickening in the paracolic gutters without discrete peritoneal nodularity. 2. Trace free fluid in the pelvic cul-de-sac. 3. Stable small dependent right pleural effusion. 4. Subcentimeter pancreatic  body cystic lesion is stable to slightly decreased. 5. Aortic Atherosclerosis (ICD10-I70.0).    04/21/2018 Tumor Marker    Patient's tumor was tested for the following markers: CA125 Results of the tumor marker test revealed 187.3    04/23/2018 - 09/12/2018 Anti-estrogen oral therapy    She is placed on Femara    05/08/2018 Procedure    Successful ultrasound guided right thoracentesis yielding 800 mL of pleural fluid. Fluid cytology is negative for malignancy     05/28/2018 Tumor Marker    Patient's tumor was tested for the following markers: CA125 Results of the tumor marker test revealed 190    06/30/2018 Tumor Marker    Patient's tumor was tested for the following markers: CA125 Results of the tumor marker test revealed 148    07/23/2018 Imaging    Status post hysterectomy and bilateral salpingo-oophorectomy.  Very mild peritoneal thickening/nodularity, equivocal but worrisome for very mild peritoneal disease. Attention on follow-up is suggested.  Small right pleural effusion with indwelling pleural drain.    07/23/2018 Tumor Marker    Patient's tumor was tested for the following markers: CA125 Results of the tumor marker test revealed 215  09/11/2018 Tumor Marker    Patient's tumor was tested for the following markers: CA-125. Results of the tumor marker test revealed 1323    09/12/2018 Imaging    1. Mildly progressive ascites with some mild degree of nodularity most appreciable along the left adnexa, right paracolic gutter, right upper quadrant, compatible with peritoneal spread of tumor. 2. Small right pleural effusion with indwelling right pleural catheter again noted. Mild increase in atelectasis anteriorly at the right lung base. 3. Aortic Atherosclerosis (ICD10-I70.0). Coronary atherosclerosis. 4. Several tiny hypodense lesions in the spleen are technically nonspecific, but stable. 5. Air fluid level in the rectum compatible with diarrheal process. 6. Prominent  bilateral hip arthropathy. Left hip screw noted.    09/22/2018 - 02/02/2019 Chemotherapy    The patient had carboplatin and gemzar    09/29/2018 Tumor Marker    Patient's tumor was tested for the following markers: CA125 Results of the tumor marker test revealed 1219    09/29/2018 Adverse Reaction    Cycle 1 day 8 of Gemzar was omitted due to severe anemia    10/20/2018 Tumor Marker    Patient's tumor was tested for the following markers: CA125 Results of the tumor marker test revealed 402    11/13/2018 Tumor Marker    Patient's tumor was tested for the following markers: CA125 Results of the tumor marker test revealed 437    11/26/2018 Imaging    1. Mild residual peritoneal thickening, overall decreased from 09/11/2018, with resolution of previously seen ascites. 2. Tiny residual right pleural effusion with small bore chest tube in place. 3. Supraumbilical midline ventral hernia has a wide neck and contains unobstructed colon. 4.  Aortic atherosclerosis (ICD10-170.0).     01/05/2019 Tumor Marker    Patient's tumor was tested for the following markers: CA125 Results of the tumor marker test revealed 929    02/02/2019 Tumor Marker    Patient's tumor was tested for the following markers: CA125 Results of the tumor marker test revealed 1198    02/09/2019 Imaging    1. New mild ascites. Mild peritoneal thickening shows no significant change, but remains consistent with peritoneal carcinomatosis. 2. Increased tiny bilateral pleural effusions. 3. Partial small bowel obstruction with transition point in the anterior lower abdomen, just deep to the anterior abdominal wall. 4. Stable small ventral abdominal wall hernia containing transverse colon.    02/17/2019 -  Chemotherapy    The patient had PACLitaxel for chemotherapy treatment.      REVIEW OF SYSTEMS:   Constitutional: Denies fevers, chills or abnormal weight loss Eyes: Denies blurriness of vision Ears, nose, mouth, throat, and  face: Denies mucositis or sore throat Respiratory: Denies cough, dyspnea or wheezes Cardiovascular: Denies palpitation, chest discomfort or lower extremity swelling Gastrointestinal:  Denies nausea, heartburn or change in bowel habits Skin: Denies abnormal skin rashes Lymphatics: Denies new lymphadenopathy or easy bruising Neurological:Denies numbness, tingling or new weaknesses Behavioral/Psych: Mood is stable, no new changes  All other systems were reviewed with the patient and are negative.  I have reviewed the past medical history, past surgical history, social history and family history with the patient and they are unchanged from previous note.  ALLERGIES:  is allergic to codeine.  MEDICATIONS:  Current Outpatient Medications  Medication Sig Dispense Refill  . albuterol (PROAIR HFA) 108 (90 Base) MCG/ACT inhaler Inhale 2 puffs into the lungs every 6 (six) hours as needed for wheezing or shortness of breath. 1 Inhaler 3  . ALPRAZolam (XANAX) 0.25 MG tablet  Take 1 tablet (0.25 mg total) by mouth at bedtime as needed for anxiety. 5 tablet 0  . amoxicillin (AMOXIL) 500 MG capsule Take 2,000 mg by mouth See admin instructions. Take 4 capsules (2000 mg) by mouth 1 hour prior to dental procedures  1  . azelastine (ASTELIN) 0.1 % nasal spray Place 2 sprays into both nostrils 2 (two) times daily as needed for allergies.     Marland Kitchen CARTIA XT 120 MG 24 hr capsule Take 120 mg by mouth at bedtime.     . Cholecalciferol (VITAMIN D) 2000 units CAPS Take 2,000 Units by mouth daily.     Marland Kitchen dicyclomine (BENTYL) 10 MG capsule Take 1 capsule (10 mg total) by mouth 2 (two) times daily. 60 capsule 1  . diltiazem (CARDIZEM CD) 180 MG 24 hr capsule TAKE ONE CAPSULE BY MOUTH EVERY MORNING (Patient taking differently: Take 180 mg by mouth daily. ) 90 capsule 3  . diphenhydrAMINE (BENADRYL) 25 mg capsule Take 25 mg by mouth every 8 (eight) hours as needed for itching.     . diphenoxylate-atropine (LOMOTIL) 2.5-0.025  MG tablet Take 1 tablet by mouth 4 (four) times daily. (Patient taking differently: Take 1 tablet by mouth 4 (four) times daily as needed for diarrhea or loose stools. ) 360 tablet 1  . DULoxetine (CYMBALTA) 60 MG capsule TAKE 1 CAPSULE(60 MG) BY MOUTH DAILY (Patient taking differently: Take 60 mg by mouth daily. ) 30 capsule 3  . fluticasone (FLONASE) 50 MCG/ACT nasal spray Place 2 sprays into both nostrils 2 (two) times daily as needed (FOR NASAL CONGESTION.).     Marland Kitchen furosemide (LASIX) 40 MG tablet Take 1 tablet (40 mg total) by mouth daily. 90 tablet 3  . halobetasol (ULTRAVATE) 0.05 % cream Apply 1 application topically 2 (two) times daily as needed (psoriasis).     Marland Kitchen levothyroxine (SYNTHROID, LEVOTHROID) 137 MCG tablet Take 137 mcg by mouth daily before breakfast. For thyroid therapy    . Magnesium 400 MG CAPS Take 400 mg by mouth 2 (two) times a day.     . mirtazapine (REMERON) 15 MG tablet TAKE 1 TABLET(15 MG) BY MOUTH AT BEDTIME (Patient taking differently: Take 15 mg by mouth at bedtime. ) 30 tablet 11  . oxyCODONE (ROXICODONE) 15 MG immediate release tablet Take 1 tablet (15 mg total) by mouth every 4 (four) hours as needed for pain. 60 tablet 0  . polyvinyl alcohol (LIQUIFILM TEARS) 1.4 % ophthalmic solution Place 1 drop into both eyes 2 (two) times daily as needed for dry eyes.     . potassium chloride SA (K-DUR,KLOR-CON) 20 MEQ tablet TAKE 1 TABLET BY MOUTH DAILY AND TAKE 1 ADDITIONAL TABLET WHEN TAKING EXTRA LASIX DOSE (Patient taking differently: Take 20 mEq by mouth daily. ) 60 tablet 6  . STIOLTO RESPIMAT 2.5-2.5 MCG/ACT AERS INHALE 2 PUFFS INTO THE LUNGS DAILY (Patient taking differently: Inhale 2 puffs into the lungs daily. ) 1 g 3  . XARELTO 20 MG TABS tablet TAKE 1 TABLET BY MOUTH EVERY DAY AFTER SUPPER (Patient not taking: High risk med: Anticoagulant.  Crushed Xarelto can be given down a G-tube but NOT a J-Tube.) 90 tablet 3   No current facility-administered medications for this  visit.    Facility-Administered Medications Ordered in Other Visits  Medication Dose Route Frequency Provider Last Rate Last Dose  . 0.9 %  sodium chloride infusion (Manually program via Guardrails IV Fluids)  250 mL Intravenous Once Heath Lark, MD   Stopped  at 12/16/18 1500  . heparin lock flush 100 unit/mL  500 Units Intracatheter Daily PRN Alvy Bimler, Norvell Caswell, MD      . sodium chloride flush (NS) 0.9 % injection 10 mL  10 mL Intracatheter PRN Rathana Viveros, MD      . sodium chloride flush (NS) 0.9 % injection 3 mL  3 mL Intracatheter PRN Alvy Bimler, Nzinga Ferran, MD        PHYSICAL EXAMINATION: ECOG PERFORMANCE STATUS: 2 - Symptomatic, <50% confined to bed  Vitals:   03/05/19 0838  BP: 138/60  Pulse: 93  Resp: 18  Temp: 98.2 F (36.8 C)  SpO2: 98%   Filed Weights   03/05/19 0838  Weight: 189 lb 9.6 oz (86 kg)    GENERAL:alert, no distress and comfortable SKIN: skin color, texture, turgor are normal, no rashes or significant lesions EYES: normal, Conjunctiva are pink and non-injected, sclera clear OROPHARYNX:no exudate, no erythema and lips, buccal mucosa, and tongue normal  NECK: supple, thyroid normal size, non-tender, without nodularity LYMPH:  no palpable lymphadenopathy in the cervical, axillary or inguinal LUNGS: She has oxygen delivered a new nasal cannula with bilateral wheezes  hEART: regular rate & rhythm and no murmurs and no lower extremity edema ABDOMEN:abdomen soft, non-tender and normal bowel sounds Musculoskeletal:no cyanosis of digits and no clubbing  NEURO: alert & oriented x 3 with fluent speech, no focal motor/sensory deficits  LABORATORY DATA:  I have reviewed the data as listed    Component Value Date/Time   NA 134 (L) 03/05/2019 0802   NA 135 (L) 09/24/2017 0945   K 3.7 03/05/2019 0802   K 3.7 09/24/2017 0945   CL 96 (L) 03/05/2019 0802   CO2 31 03/05/2019 0802   CO2 30 (H) 09/24/2017 0945   GLUCOSE 132 (H) 03/05/2019 0802   GLUCOSE 101 09/24/2017 0945   BUN 16  03/05/2019 0802   BUN 15.3 09/24/2017 0945   CREATININE 1.15 (H) 03/05/2019 0802   CREATININE 1.22 (H) 12/29/2018 0900   CREATININE 0.8 09/24/2017 0945   CALCIUM 8.2 (L) 03/05/2019 0802   CALCIUM 9.1 09/24/2017 0945   PROT 6.7 03/05/2019 0802   PROT 6.6 09/24/2017 0945   ALBUMIN 2.6 (L) 03/05/2019 0802   ALBUMIN 3.3 (L) 09/24/2017 0945   AST 21 03/05/2019 0802   AST 26 12/29/2018 0900   AST 16 09/24/2017 0945   ALT 14 03/05/2019 0802   ALT 21 12/29/2018 0900   ALT 12 09/24/2017 0945   ALKPHOS 62 03/05/2019 0802   ALKPHOS 52 09/24/2017 0945   BILITOT 0.2 (L) 03/05/2019 0802   BILITOT 0.2 (L) 12/29/2018 0900   BILITOT 1.13 09/24/2017 0945   GFRNONAA 44 (L) 03/05/2019 0802   GFRNONAA 41 (L) 12/29/2018 0900   GFRAA 51 (L) 03/05/2019 0802   GFRAA 47 (L) 12/29/2018 0900    No results found for: SPEP, UPEP  Lab Results  Component Value Date   WBC 8.1 03/05/2019   NEUTROABS 5.5 03/05/2019   HGB 7.6 (L) 03/05/2019   HCT 26.1 (L) 03/05/2019   MCV 81.8 03/05/2019   PLT 293 03/05/2019      Chemistry      Component Value Date/Time   NA 134 (L) 03/05/2019 0802   NA 135 (L) 09/24/2017 0945   K 3.7 03/05/2019 0802   K 3.7 09/24/2017 0945   CL 96 (L) 03/05/2019 0802   CO2 31 03/05/2019 0802   CO2 30 (H) 09/24/2017 0945   BUN 16 03/05/2019 0802   BUN 15.3  09/24/2017 0945   CREATININE 1.15 (H) 03/05/2019 0802   CREATININE 1.22 (H) 12/29/2018 0900   CREATININE 0.8 09/24/2017 0945      Component Value Date/Time   CALCIUM 8.2 (L) 03/05/2019 0802   CALCIUM 9.1 09/24/2017 0945   ALKPHOS 62 03/05/2019 0802   ALKPHOS 52 09/24/2017 0945   AST 21 03/05/2019 0802   AST 26 12/29/2018 0900   AST 16 09/24/2017 0945   ALT 14 03/05/2019 0802   ALT 21 12/29/2018 0900   ALT 12 09/24/2017 0945   BILITOT 0.2 (L) 03/05/2019 0802   BILITOT 0.2 (L) 12/29/2018 0900   BILITOT 1.13 09/24/2017 0945       RADIOGRAPHIC STUDIES: I have personally reviewed the radiological images as listed  and agreed with the findings in the report. Ct Abdomen Pelvis W Contrast  Result Date: 02/09/2019 CLINICAL DATA:  Metastatic left ovarian epithelial carcinoma. Currently undergoing chemotherapy. Restaging. EXAM: CT ABDOMEN AND PELVIS WITH CONTRAST TECHNIQUE: Multidetector CT imaging of the abdomen and pelvis was performed using the standard protocol following bolus administration of intravenous contrast. CONTRAST:  55m OMNIPAQUE IOHEXOL 300 MG/ML  SOLN COMPARISON:  11/26/2018 FINDINGS: Lower Chest: Increased tiny bilateral pleural effusions. Right pleural catheter remains in place. Hepatobiliary: No hepatic masses identified. Prior cholecystectomy. No evidence of biliary obstruction. Pancreas:  No mass or inflammatory changes. Spleen: Within normal limits in size. Several sub-cm low-attenuation splenic lesions remain stable. Adrenals/Urinary Tract: No masses identified. No evidence of hydronephrosis. Stomach/Bowel: Mild dilatation is proximal and mid small bowel loops is seen with apparent transition point in the anterior lower abdomen, just deep to the anterior abdominal wall. This consistent with a partial small bowel obstruction. Mild diverticulosis is seen involving the sigmoid colon, however there is no evidence of diverticulitis. Vascular/Lymphatic: No pathologically enlarged lymph nodes. No abdominal aortic aneurysm. Aortic atherosclerosis. Reproductive: Prior hysterectomy. Mild ascites is new since previous study. Mild peritoneal thickening in the pelvis and along the paracolic gutters shows no significant change, consistent with peritoneal carcinomatosis. Other: Stable small ventral abdominal wall hernia containing transverse colon. Musculoskeletal:  No suspicious bone lesions identified. IMPRESSION: 1. New mild ascites. Mild peritoneal thickening shows no significant change, but remains consistent with peritoneal carcinomatosis. 2. Increased tiny bilateral pleural effusions. 3. Partial small bowel  obstruction with transition point in the anterior lower abdomen, just deep to the anterior abdominal wall. 4. Stable small ventral abdominal wall hernia containing transverse colon. Electronically Signed   By: JEarle GellM.D.   On: 02/09/2019 13:11    All questions were answered. The patient knows to call the clinic with any problems, questions or concerns. No barriers to learning was detected.  I spent 25 minutes counseling the patient face to face. The total time spent in the appointment was 30 minutes and more than 50% was on counseling and review of test results  NHeath Lark MD 03/05/2019 12:18 PM

## 2019-03-05 NOTE — Assessment & Plan Note (Signed)
She has poorly controlled pain I recommend increasing oxycodone to 50 mg as needed I will assess pain control in her next visit I warned her about risk of nausea, constipation and sedation

## 2019-03-05 NOTE — Assessment & Plan Note (Signed)
Unfortunately, due to severe anemia, I will put her treatment on hold this weekend will resume treatment as scheduled on June 8 We will transfuse her today due to symptomatic anemia

## 2019-03-05 NOTE — Assessment & Plan Note (Signed)
She is on anticoagulation therapy She has recent bleeding We will transfuse her today

## 2019-03-06 LAB — TYPE AND SCREEN
ABO/RH(D): O POS
Antibody Screen: NEGATIVE
Unit division: 0

## 2019-03-06 LAB — BPAM RBC
Blood Product Expiration Date: 202006232359
ISSUE DATE / TIME: 202005281018
Unit Type and Rh: 5100

## 2019-03-06 LAB — CA 125: Cancer Antigen (CA) 125: 1243 U/mL — ABNORMAL HIGH (ref 0.0–38.1)

## 2019-03-10 ENCOUNTER — Ambulatory Visit: Payer: Medicare Other

## 2019-03-10 ENCOUNTER — Other Ambulatory Visit: Payer: Self-pay | Admitting: Cardiothoracic Surgery

## 2019-03-10 ENCOUNTER — Other Ambulatory Visit: Payer: Self-pay

## 2019-03-10 DIAGNOSIS — J90 Pleural effusion, not elsewhere classified: Secondary | ICD-10-CM

## 2019-03-11 ENCOUNTER — Ambulatory Visit (INDEPENDENT_AMBULATORY_CARE_PROVIDER_SITE_OTHER): Payer: Self-pay

## 2019-03-11 ENCOUNTER — Ambulatory Visit
Admission: RE | Admit: 2019-03-11 | Discharge: 2019-03-11 | Disposition: A | Discharge status: Home / Self Care | Payer: Medicare Other | Source: Ambulatory Visit | Attending: Cardiothoracic Surgery | Admitting: Cardiothoracic Surgery

## 2019-03-11 VITALS — BP 116/69 | HR 80 | Temp 97.6°F | Resp 20

## 2019-03-11 DIAGNOSIS — Z4802 Encounter for removal of sutures: Secondary | ICD-10-CM

## 2019-03-11 DIAGNOSIS — J90 Pleural effusion, not elsewhere classified: Secondary | ICD-10-CM

## 2019-03-11 NOTE — Progress Notes (Signed)
Removed 2 sutures from chest tube incision site, no signs of infection and patient tolerated well. Wayne Gold-PA-C reviewed CXR. patient states that she knows what it feels like when she is filling up with fluid.  She was told to call back if she developed an increased shortness of breath.

## 2019-03-12 ENCOUNTER — Telehealth: Payer: Self-pay | Admitting: *Deleted

## 2019-03-12 ENCOUNTER — Telehealth: Payer: Self-pay

## 2019-03-12 NOTE — Telephone Encounter (Signed)
MR faxed to [facility]nec[facility] Oncology - Release [id number]

## 2019-03-12 NOTE — Telephone Encounter (Signed)
[name] called and left a message. Her Mom has appts for 6/8. She thought scheduling would call and schedule more appts. She is just checking.

## 2019-03-16 ENCOUNTER — Inpatient Hospital Stay: Payer: Medicare Other

## 2019-03-16 ENCOUNTER — Inpatient Hospital Stay (HOSPITAL_BASED_OUTPATIENT_CLINIC_OR_DEPARTMENT_OTHER): Payer: Medicare Other | Admitting: Hematology and Oncology

## 2019-03-16 ENCOUNTER — Other Ambulatory Visit: Payer: Self-pay

## 2019-03-16 ENCOUNTER — Inpatient Hospital Stay: Payer: Medicare Other | Attending: Hematology and Oncology

## 2019-03-16 DIAGNOSIS — M797 Fibromyalgia: Secondary | ICD-10-CM

## 2019-03-16 DIAGNOSIS — Z79899 Other long term (current) drug therapy: Secondary | ICD-10-CM | POA: Diagnosis not present

## 2019-03-16 DIAGNOSIS — J90 Pleural effusion, not elsewhere classified: Secondary | ICD-10-CM | POA: Diagnosis not present

## 2019-03-16 DIAGNOSIS — C562 Malignant neoplasm of left ovary: Secondary | ICD-10-CM

## 2019-03-16 DIAGNOSIS — G893 Neoplasm related pain (acute) (chronic): Secondary | ICD-10-CM

## 2019-03-16 DIAGNOSIS — Z5111 Encounter for antineoplastic chemotherapy: Secondary | ICD-10-CM | POA: Insufficient documentation

## 2019-03-16 DIAGNOSIS — J449 Chronic obstructive pulmonary disease, unspecified: Secondary | ICD-10-CM | POA: Insufficient documentation

## 2019-03-16 DIAGNOSIS — G629 Polyneuropathy, unspecified: Secondary | ICD-10-CM

## 2019-03-16 DIAGNOSIS — Z79811 Long term (current) use of aromatase inhibitors: Secondary | ICD-10-CM | POA: Insufficient documentation

## 2019-03-16 DIAGNOSIS — K5909 Other constipation: Secondary | ICD-10-CM | POA: Insufficient documentation

## 2019-03-16 DIAGNOSIS — D638 Anemia in other chronic diseases classified elsewhere: Secondary | ICD-10-CM

## 2019-03-16 LAB — CBC WITH DIFFERENTIAL/PLATELET
Abs Immature Granulocytes: 0.12 10*3/uL — ABNORMAL HIGH (ref 0.00–0.07)
Basophils Absolute: 0.1 10*3/uL (ref 0.0–0.1)
Basophils Relative: 1 %
Eosinophils Absolute: 0.4 10*3/uL (ref 0.0–0.5)
Eosinophils Relative: 6 %
HCT: 31.9 % — ABNORMAL LOW (ref 36.0–46.0)
Hemoglobin: 9.2 g/dL — ABNORMAL LOW (ref 12.0–15.0)
Immature Granulocytes: 2 %
Lymphocytes Relative: 17 %
Lymphs Abs: 1.4 10*3/uL (ref 0.7–4.0)
MCH: 23.5 pg — ABNORMAL LOW (ref 26.0–34.0)
MCHC: 28.8 g/dL — ABNORMAL LOW (ref 30.0–36.0)
MCV: 81.4 fL (ref 80.0–100.0)
Monocytes Absolute: 1 10*3/uL (ref 0.1–1.0)
Monocytes Relative: 13 %
Neutro Abs: 5 10*3/uL (ref 1.7–7.7)
Neutrophils Relative %: 61 %
Platelets: 293 10*3/uL (ref 150–400)
RBC: 3.92 MIL/uL (ref 3.87–5.11)
RDW: 17.8 % — ABNORMAL HIGH (ref 11.5–15.5)
WBC: 8 10*3/uL (ref 4.0–10.5)
nRBC: 0 % (ref 0.0–0.2)

## 2019-03-16 LAB — SAMPLE TO BLOOD BANK

## 2019-03-16 LAB — COMPREHENSIVE METABOLIC PANEL
ALT: 11 U/L (ref 0–44)
AST: 27 U/L (ref 15–41)
Albumin: 2.8 g/dL — ABNORMAL LOW (ref 3.5–5.0)
Alkaline Phosphatase: 68 U/L (ref 38–126)
Anion gap: 9 (ref 5–15)
BUN: 18 mg/dL (ref 8–23)
CO2: 34 mmol/L — ABNORMAL HIGH (ref 22–32)
Calcium: 8.5 mg/dL — ABNORMAL LOW (ref 8.9–10.3)
Chloride: 93 mmol/L — ABNORMAL LOW (ref 98–111)
Creatinine, Ser: 1.29 mg/dL — ABNORMAL HIGH (ref 0.44–1.00)
GFR calc Af Amer: 44 mL/min — ABNORMAL LOW (ref >60)
GFR calc non Af Amer: 38 mL/min — ABNORMAL LOW (ref >60)
Glucose, Bld: 103 mg/dL — ABNORMAL HIGH (ref 70–99)
Potassium: 3.6 mmol/L (ref 3.5–5.1)
Sodium: 136 mmol/L (ref 135–145)
Total Bilirubin: 0.3 mg/dL (ref 0.3–1.2)
Total Protein: 7.3 g/dL (ref 6.5–8.1)

## 2019-03-16 MED ORDER — SODIUM CHLORIDE 0.9 % IV SOLN
Freq: Once | INTRAVENOUS | Status: AC
  Administered 2019-03-16: 10:00:00 via INTRAVENOUS
  Filled 2019-03-16: qty 250

## 2019-03-16 MED ORDER — SODIUM CHLORIDE 0.9 % IV SOLN
20.0000 mg | Freq: Once | INTRAVENOUS | Status: AC
  Administered 2019-03-16: 20 mg via INTRAVENOUS
  Filled 2019-03-16: qty 20

## 2019-03-16 MED ORDER — DIPHENHYDRAMINE HCL 50 MG/ML IJ SOLN
50.0000 mg | Freq: Once | INTRAMUSCULAR | Status: AC
  Administered 2019-03-16: 50 mg via INTRAVENOUS

## 2019-03-16 MED ORDER — FAMOTIDINE IN NACL 20-0.9 MG/50ML-% IV SOLN
INTRAVENOUS | Status: AC
  Filled 2019-03-16: qty 50

## 2019-03-16 MED ORDER — SODIUM CHLORIDE 0.9 % IV SOLN
64.0000 mg/m2 | Freq: Once | INTRAVENOUS | Status: AC
  Administered 2019-03-16: 126 mg via INTRAVENOUS
  Filled 2019-03-16: qty 21

## 2019-03-16 MED ORDER — DIPHENHYDRAMINE HCL 50 MG/ML IJ SOLN
INTRAMUSCULAR | Status: AC
  Filled 2019-03-16: qty 1

## 2019-03-16 MED ORDER — SODIUM CHLORIDE 0.9% FLUSH
10.0000 mL | INTRAVENOUS | Status: DC | PRN
  Administered 2019-03-16: 10 mL
  Filled 2019-03-16: qty 10

## 2019-03-16 MED ORDER — FAMOTIDINE IN NACL 20-0.9 MG/50ML-% IV SOLN
20.0000 mg | Freq: Once | INTRAVENOUS | Status: AC
  Administered 2019-03-16: 20 mg via INTRAVENOUS

## 2019-03-16 MED ORDER — HEPARIN SOD (PORK) LOCK FLUSH 100 UNIT/ML IV SOLN
500.0000 [IU] | Freq: Once | INTRAVENOUS | Status: AC | PRN
  Administered 2019-03-16: 500 [IU]
  Filled 2019-03-16: qty 5

## 2019-03-16 MED ORDER — SODIUM CHLORIDE 0.9% FLUSH
10.0000 mL | Freq: Once | INTRAVENOUS | Status: AC
  Administered 2019-03-16: 10 mL
  Filled 2019-03-16: qty 10

## 2019-03-16 NOTE — Patient Instructions (Signed)
[facility] Discharge Instructions for Patients Receiving Chemotherapy  Today you received the following chemotherapy agents:  Taxol.  To help prevent nausea and vomiting after your treatment, we encourage you to take your nausea medication as directed.   If you develop nausea and vomiting that is not controlled by your nausea medication, call the clinic.   BELOW ARE SYMPTOMS THAT SHOULD BE REPORTED IMMEDIATELY:  *FEVER GREATER THAN 100.5 F  *CHILLS WITH OR WITHOUT FEVER  NAUSEA AND VOMITING THAT IS NOT CONTROLLED WITH YOUR NAUSEA MEDICATION  *UNUSUAL SHORTNESS OF BREATH  *UNUSUAL BRUISING OR BLEEDING  TENDERNESS IN MOUTH AND THROAT WITH OR WITHOUT PRESENCE OF ULCERS  *URINARY PROBLEMS  *BOWEL PROBLEMS  UNUSUAL RASH Items with * indicate a potential emergency and should be followed up as soon as possible.  Feel free to call the clinic should you have any questions or concerns. The clinic phone number is [phone].  Please show the CHEMO ALERT CARD at check-in to the Emergency Department and triage nurse.   

## 2019-03-16 NOTE — Patient Instructions (Signed)

## 2019-03-17 ENCOUNTER — Encounter: Payer: Self-pay | Admitting: Hematology and Oncology

## 2019-03-17 ENCOUNTER — Telehealth: Payer: Self-pay | Admitting: Hematology and Oncology

## 2019-03-17 NOTE — Assessment & Plan Note (Signed)
Overall, she tolerated chemotherapy poorly with multiple other symptoms due to her significant comorbidities We will resume chemotherapy today I plan to space out her treatment to every other week along with dose adjustment as prescribed The plan would be to give her about 6 doses of chemotherapy before repeat imaging study

## 2019-03-17 NOTE — Progress Notes (Signed)
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[place]) Overall, she tolerated chemotherapy poorly with multiple other symptoms due to her significant comorbidities We will resume chemotherapy today I plan to space out her treatment to every other week along with dose adjustment as prescribed The plan would be to give her about 6 doses of chemotherapy before repeat imaging study  Anemia, chronic disease She is not symptomatic from her anemia today We will transfuse her if hemoglobin is less than 7.5  Cancer associated pain She has multifactorial pain, component of cancer pain and her chronic pain She is only taking 2 doses of oxycodone daily as needed I reminded the patient that she can take it more frequently or to add over-the-counter analgesics as needed  COPD (chronic obstructive pulmonary disease) ([place]) She has mild wheezes on exam She also have reduced breath sounds on lung bases consistent with pleural effusion She will continue her oxygen therapy as directed  Other constipation She has stable chronic constipation We discussed laxative therapy   No orders of the defined types were placed in this encounter.   INTERVAL HISTORY: Please see below for problem oriented charting. She returns today for chemotherapy I also contacted her daughter over the telephone Overall, the patient had multiple symptoms She denies worsening neuropathy Denies abdominal bloating She has chronic constipation and is using laxative She is struggling with chronic pain all over, component of chronic joint  pain/fibromyalgia on top of her cancer associated pain She also have chronic stable shortness of breath with occasional wheezes No recent infection, fever or chills She is wondering about traveling to the beach as well as getting pedicure and manicure for her long nails  SUMMARY OF ONCOLOGIC HISTORY: Oncology History   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 Interim finding of moderate ascites within the abdomen and pelvis with additional finding of diffuse nodular infiltration of the omentum and anterior mesenteric fat, the appearance would be consistent with peritoneal carcinomatosis/metastatic disease. Increasing retroperitoneal and upper abdominal adenopathy. 2. Re- demonstrated 3.8 cm cyst in the right adnexa. Enlarging soft tissue density in the left adnexa now with possible cystic component posteriorly. In light of the above findings, concern is for ovarian neoplasm. Correlation with pelvic ultrasound recommended. 3. Small right-sided pleural effusion, new since prior study 4. Stable hypodense splenic lesions since 2017.     06/25/2017 Imaging    US pelvis: 2.9 cm simple appearing cyst in the right ovary. Left ovary grossly unremarkable. Large volume ascites in the pelvis    06/30/2017 -  07/01/2017 Hospital Admission    She was admitted for evaluation of abdominal pain and ascites    07/01/2017 Pathology Results    PERITONEAL/ASCITIC  FLUID(SPECIMEN 1 OF 1 COLLECTED 07/01/17): - POORLY DIFFERENTIATED CARCINOMA; SEE COMMENT Source Peritoneal/Ascitic Fluid, (specimen 1 of 1 collected 07/01/17) Gross Specimen: Received is/are 1000 cc's of brownish fluid. (BS:bs) Prepared: # Smears: 0 # Concentration Technique Slides (i.e. ThinPrep): 1 # Cell Block: 1 Additional Studies: Also received Hematology slide - [id number]. Comment The tumor cells are positive for cytokeratin 7 and Pax-8 but negative for cytokeratin 20, CDX-2, GATA-3, Napsin-A and TTF-1. Based on the immunoprofile a gynecology primary is favored    07/01/2017 Procedure    Successful ultrasound-guided diagnostic and therapeutic paracentesis yielding 2.5 liters of peritoneal fluid    07/07/2017 - 07/09/2017 Hospital Admission    She was admitted for management of malignant ascites    07/08/2017 Procedure    Successful ultrasound-guided therapeutic paracentesis yielding 2.7 liters liters of peritoneal fluid    07/12/2017 Procedure    Successful ultrasound-guided paracentesis yielding 1450 mL of peritoneal fluid    07/18/2017 - 07/24/2017 Hospital Admission    She was admitted for expedited treatment    07/18/2017 Tumor Marker    Patient's tumor was tested for the following markers: CA125 Results of the tumor marker test revealed 1941    07/19/2017 - 02/04/2018 Chemotherapy    The patient had 6 cycles of carboplatin & Taxol for chemotherapy treatment, followed by 3 more cycles of carboplatin only     07/19/2017 - 02/04/2018 Chemotherapy    The patient had carboplatin and taxol    08/06/2017 Procedure    Successful ultrasound-guided therapeutic paracentesis yielding 2.6 liters of peritoneal fluid.    08/09/2017 Tumor Marker    Patient's tumor was tested for the following markers: CA125 Results of the tumor marker test revealed 1665    08/15/2017 Tumor Marker    Patient's tumor was tested for the following markers: CA125 Results of the tumor marker test revealed  937.9    08/20/2017 Imaging    ECHO: Normal LV size with EF 60-65%. Normal RV size and systolic function. No significant valvular abnormalities.    09/18/2017 Imaging    Chest Impression:  1. No evidence thoracic metastasis. 2. Interval increase and RIGHT pleural effusion.  Abdomen / Pelvis Impression:  1. Interval decrease in intraperitoneal free fluid. 2. Interval decrease in omental nodularity in the LEFT ventral peritoneal space. 3. Interval decrease in nodularity associated with the LEFT ovary. 4. Cystic portion of the RIGHT ovary is increased mildly in size.    09/20/2017 Tumor Marker    Patient's tumor was tested for the following markers: CA125 Results of the tumor marker test revealed 347    10/14/2017 Tumor Marker    Patient's tumor was tested for the following markers: CA125 Results of the tumor marker test revealed 307.4    11/04/2017 Tumor Marker    Patient's tumor was tested for the following markers: CA125 Results of the tumor marker test revealed 262.5    11/28/2017 Imaging    1. Interval decrease in right pleural effusion with resolution of right atelectasis seen previously. 2. New small left pleural effusion, symmetric to the right. 3. No intraperitoneal free fluid on the current study. 4. Continued further decrease in left omental disease, appearing less confluent today than on the prior study. 5. Left ovary remains normal in appearance today and the right adnexal cystic lesion is decreased in size compared to prior study.  6. 14 mm pancreatic cyst is unchanged. Continued attention on follow-up imaging recommended. 7. Aortic Atherosclerois (ICD10-170.0)    12/13/2017 Tumor Marker    Patient's tumor was tested for the following markers: CA125 Results of the tumor marker test revealed 197.7    01/03/2018 Tumor Marker    Patient's tumor was tested for the following markers: CA125 Results of the tumor marker test revealed 183.1    01/14/2018 Tumor Marker     Patient's tumor was tested for the following markers: CA125 Results of the tumor marker test revealed 177.4    02/04/2018 Tumor Marker    Patient's tumor was tested for the following markers: CA125 Results of the tumor marker test revealed 168.5    02/25/2018 Imaging    1. Omental carcinomatosis appears qualitatively stable to slightly decreased. Stable mild peritoneal thickening in the paracolic gutters. Stable right adnexal cyst. No ascites. No new or progressive metastatic disease in the abdomen or pelvis. 2. Small dependent right pleural effusion is increased. 3. Cystic pancreatic body lesion is decreased and now subcentimeter in size, suggesting a benign lesion. 4. Aortic Atherosclerosis (ICD10-I70.0).    03/03/2018 - 03/07/2018 Hospital Admission    She was hospitalized for GI bleed requiring blood transfusions. Xarelto was placed on hold    03/07/2018 PET scan    1. Persistent hazy omental interstitial nodularity but no hypermetabolism or discrete measurable nodules. No abdominal ascites. 2. No findings for metastatic disease involving the chest. 3. Moderate-sized right pleural effusion and small left pleural effusion.     03/20/2018 Pathology Results    1. Ovary and fallopian tube, right - OVARY AND FALLOPIAN TUBE INVOLVED BY SEROUS CARCINOMA. - PARATUBAL CYST. 2. Uterus +/- tubes/ovaries, neoplastic, cervix, left ovary and fallopian tube - LEFT OVARY: HIGH GRADE SEROUS CARCINOMA WITH TREATMENT EFFECT, SPANNING 2.5 CM. CARCINOMA INVOLVES OVARIAN SURFACE. SEE ONCOLOGY TABLE. - LEFT FALLOPIAN TUBE: INVOLVED BY SEROUS CARCINOMA. - UTERUS: -ENDOMETRIUM: INACTIVE ENDOMETRIUM. NO HYPERPLASIA OR MALIGNANCY. -MYOMETRIUM: UNREMARKABLE. NO MALIGNANCY. -SEROSA: INVOLVED BY SEROUS CARCINOMA. - CERVIX: ENDOCERVICAL POLYP. NO MALIGNANCY. 3. Omentum, resection for tumor - INVOLVED BY SEROUS CARCINOMA. 4. Soft tissue, biopsy, mesenteric nodule - INVOLVED BY SEROUS CARCINOMA. Microscopic  Comment 2. OVARY or FALLOPIAN TUBE or PRIMARY PERITONEUM: Procedure: Total hysterectomy and bilateral salpingo-oophorectomy. Omentectomy. Mesenteric lymph node biopsy. Specimen Integrity: Intact. Tumor Site: Left ovary. Ovarian Surface Involvement (required only if applicable): Present. Fallopian Tube Surface Involvement (required only if applicable): Present, bilateral. Tumor Size: 2.5 cm. Histologic Type: High grade serous carcinoma. Histologic Grade: High grade. Implants (required for advanced stage serous/seromucinous borderline tumors only): N/A. Other Tissue/ Organ Involvement: Bilateral fallopian tubes, right ovary, uterine serosa, omentum. Largest Extrapelvic Peritoneal Focus (required only if applicable): Microscopic, estimated 0.5 cm (omentum). Peritoneal/Ascitic Fluid: Prior Positive ([id number]). Treatment Effect (required only for high-grade serous carcinomas): Present in left ovary. CRS2. Regional Lymph Nodes: No lymph nodes submitted/identified. Pathologic Stage Classification (pTNM, AJCC 8th Edition): ypT3b, ypNX Representative Tumor Block: 1A, 1B, 71F, 71F. Comment(s): The right ovary has only surface deposits with a large paratubal cyst. The left ovary has intraparenchymal tumor with associated treatment effect. Thus the tumor location is classified as a left ovarian primary.    03/20/2018 Surgery    Procedure(s) Performed:  1. Exploratory laparotomy with total hysterectomy and bilateral salpingo-oophorectomy 2. Infragastic Omentectomy  3. Debulking to <1cm gross residual disease   Surgeon: Mart Piggs, MD  Specimens: Uterus Cervix, Bilateral tubes / ovaries and omentum. Mesenteric nodule.  Operative Findings: Debulked to  gross residual disease <1cm; however there is miliary disease in multiple locations including the majority of the abdominal peritoneum (anterior abdominal wall, bilateral gutters), diaphragm (Right>left), majority of small bowel  mesentary. Normal appendix. Normal small uterus. Right ovary with a cystic lesion ~3cm, some adhesive disease of right adnexa to rectum/sigmoid. Gross omental disease, which was resected with the omentectomy. Smooth liver surface, but again, diaphragmatic disease noted.       03/20/2018 Genetic Testing    Patient has genetic testing done for ER/PR. Results revealed patient has ER: 90%, PR 0%.     03/31/2018 Tumor Marker    Patient's tumor was tested for the following markers: CA125 Results of the tumor marker test revealed 215.8     Genetic Testing    Patient has genetic testing done for BRCA 1. Results revealed patient has the following: BRCA 1: no loss of expression.     Genetic Testing    Patient has genetic testing done for MMR . Results revealed patient has the following:  MMR: normal    03/31/2018 Genetic Testing    Patient has genetic testing done for BRCA1/2. Results revealed patient has no actionable mutations. She is found to have [place] genetic change of unknown significance    04/21/2018 Imaging    1. No definite findings of residual or recurrent metastatic disease in the abdomen or pelvis status post interval TAHBSO and omentectomy. Stable minimal thickening in the paracolic gutters without discrete peritoneal nodularity. 2. Trace free fluid in the pelvic cul-de-sac. 3. Stable small dependent right pleural effusion. 4. Subcentimeter pancreatic body cystic lesion is stable to slightly decreased. 5. Aortic Atherosclerosis (ICD10-I70.0).    04/21/2018 Tumor Marker    Patient's tumor was tested for the following markers: CA125 Results of the tumor marker test revealed 187.3    04/23/2018 - 09/12/2018 Anti-estrogen oral therapy    She is placed on Femara    05/08/2018 Procedure    Successful ultrasound guided right thoracentesis yielding 800 mL of pleural fluid. Fluid cytology is negative for malignancy     05/28/2018 Tumor Marker    Patient's tumor was tested for the  following markers: CA125 Results of the tumor marker test revealed 190    06/30/2018 Tumor Marker    Patient's tumor was tested for the following markers: CA125 Results of the tumor marker test revealed 148    07/23/2018 Imaging    Status post hysterectomy and bilateral salpingo-oophorectomy.  Very mild peritoneal thickening/nodularity, equivocal but worrisome for very mild peritoneal disease. Attention on follow-up is suggested.  Small right pleural effusion with indwelling pleural drain.    07/23/2018 Tumor Marker    Patient's tumor was tested for the following markers: CA125 Results of the tumor marker test revealed 215    09/11/2018 Tumor Marker    Patient's tumor was tested for the following markers: CA-125. Results of the tumor marker test revealed 1323    09/12/2018 Imaging    1. Mildly progressive ascites with some mild degree of nodularity most appreciable along the left adnexa, right paracolic gutter, right upper quadrant, compatible with peritoneal spread of tumor. 2. Small right pleural effusion with indwelling right pleural catheter again noted. Mild increase in atelectasis anteriorly at the right lung base. 3. Aortic Atherosclerosis (ICD10-I70.0). Coronary atherosclerosis. 4. Several tiny hypodense lesions in the spleen are technically nonspecific, but stable. 5. Air fluid level in the rectum compatible with diarrheal process. 6. Prominent bilateral hip arthropathy. Left hip screw noted.    09/22/2018 - 02/02/2019  Chemotherapy    The patient had carboplatin and gemzar    09/29/2018 Tumor Marker    Patient's tumor was tested for the following markers: CA125 Results of the tumor marker test revealed 1219    09/29/2018 Adverse Reaction    Cycle 1 day 8 of Gemzar was omitted due to severe anemia    10/20/2018 Tumor Marker    Patient's tumor was tested for the following markers: CA125 Results of the tumor marker test revealed 402    11/13/2018 Tumor Marker     Patient's tumor was tested for the following markers: CA125 Results of the tumor marker test revealed 437    11/26/2018 Imaging    1. Mild residual peritoneal thickening, overall decreased from 09/11/2018, with resolution of previously seen ascites. 2. Tiny residual right pleural effusion with small bore chest tube in place. 3. Supraumbilical midline ventral hernia has a wide neck and contains unobstructed colon. 4.  Aortic atherosclerosis (ICD10-170.0).     01/05/2019 Tumor Marker    Patient's tumor was tested for the following markers: CA125 Results of the tumor marker test revealed 929    02/02/2019 Tumor Marker    Patient's tumor was tested for the following markers: CA125 Results of the tumor marker test revealed 1198    02/09/2019 Imaging    1. New mild ascites. Mild peritoneal thickening shows no significant change, but remains consistent with peritoneal carcinomatosis. 2. Increased tiny bilateral pleural effusions. 3. Partial small bowel obstruction with transition point in the anterior lower abdomen, just deep to the anterior abdominal wall. 4. Stable small ventral abdominal wall hernia containing transverse colon.    02/17/2019 -  Chemotherapy    The patient had PACLitaxel for chemotherapy treatment.     03/05/2019 Tumor Marker    Patient's tumor was tested for the following markers: CA125 Results of the tumor marker test revealed 1243    03/11/2019 Imaging    CXR Small bilateral subpulmonic effusions with basilar atelectasis. These are probably slightly larger than were seen in March.     REVIEW OF SYSTEMS:   Constitutional: Denies fevers, chills or abnormal weight loss Eyes: Denies blurriness of vision Ears, nose, mouth, throat, and face: Denies mucositis or sore throat Cardiovascular: Denies palpitation, chest discomfort or lower extremity swelling Skin: Denies abnormal skin rashes Lymphatics: Denies new lymphadenopathy or easy bruising Behavioral/Psych: Mood is  stable, no new changes  All other systems were reviewed with the patient and are negative.  I have reviewed the past medical history, past surgical history, social history and family history with the patient and they are unchanged from previous note.  ALLERGIES:  is allergic to codeine.  MEDICATIONS:  Current Outpatient Medications  Medication Sig Dispense Refill  . albuterol (PROAIR HFA) 108 (90 Base) MCG/ACT inhaler Inhale 2 puffs into the lungs every 6 (six) hours as needed for wheezing or shortness of breath. 1 Inhaler 3  . ALPRAZolam (XANAX) 0.25 MG tablet Take 1 tablet (0.25 mg total) by mouth at bedtime as needed for anxiety. 5 tablet 0  . amoxicillin (AMOXIL) 500 MG capsule Take 2,000 mg by mouth See admin instructions. Take 4 capsules (2000 mg) by mouth 1 hour prior to dental procedures  1  . azelastine (ASTELIN) 0.1 % nasal spray Place 2 sprays into both nostrils 2 (two) times daily as needed for allergies.     Marland Kitchen CARTIA XT 120 MG 24 hr capsule Take 120 mg by mouth at bedtime.     . Cholecalciferol (VITAMIN D)  2000 units CAPS Take 2,000 Units by mouth daily.     Marland Kitchen dicyclomine (BENTYL) 10 MG capsule Take 1 capsule (10 mg total) by mouth 2 (two) times daily. 60 capsule 1  . diltiazem (CARDIZEM CD) 180 MG 24 hr capsule TAKE ONE CAPSULE BY MOUTH EVERY MORNING (Patient taking differently: Take 180 mg by mouth daily. ) 90 capsule 3  . diphenhydrAMINE (BENADRYL) 25 mg capsule Take 25 mg by mouth every 8 (eight) hours as needed for itching.     . diphenoxylate-atropine (LOMOTIL) 2.5-0.025 MG tablet Take 1 tablet by mouth 4 (four) times daily. (Patient taking differently: Take 1 tablet by mouth 4 (four) times daily as needed for diarrhea or loose stools. ) 360 tablet 1  . DULoxetine (CYMBALTA) 60 MG capsule TAKE 1 CAPSULE(60 MG) BY MOUTH DAILY (Patient taking differently: Take 60 mg by mouth daily. ) 30 capsule 3  . fluticasone (FLONASE) 50 MCG/ACT nasal spray Place 2 sprays into both nostrils 2  (two) times daily as needed (FOR NASAL CONGESTION.).     Marland Kitchen furosemide (LASIX) 40 MG tablet Take 1 tablet (40 mg total) by mouth daily. 90 tablet 3  . halobetasol (ULTRAVATE) 0.05 % cream Apply 1 application topically 2 (two) times daily as needed (psoriasis).     Marland Kitchen levothyroxine (SYNTHROID, LEVOTHROID) 137 MCG tablet Take 137 mcg by mouth daily before breakfast. For thyroid therapy    . Magnesium 400 MG CAPS Take 400 mg by mouth 2 (two) times a day.     . mirtazapine (REMERON) 15 MG tablet TAKE 1 TABLET(15 MG) BY MOUTH AT BEDTIME (Patient taking differently: Take 15 mg by mouth at bedtime. ) 30 tablet 11  . oxyCODONE (ROXICODONE) 15 MG immediate release tablet Take 1 tablet (15 mg total) by mouth every 4 (four) hours as needed for pain. 60 tablet 0  . polyvinyl alcohol (LIQUIFILM TEARS) 1.4 % ophthalmic solution Place 1 drop into both eyes 2 (two) times daily as needed for dry eyes.     . potassium chloride SA (K-DUR,KLOR-CON) 20 MEQ tablet TAKE 1 TABLET BY MOUTH DAILY AND TAKE 1 ADDITIONAL TABLET WHEN TAKING EXTRA LASIX DOSE (Patient taking differently: Take 20 mEq by mouth daily. ) 60 tablet 6  . STIOLTO RESPIMAT 2.5-2.5 MCG/ACT AERS INHALE 2 PUFFS INTO THE LUNGS DAILY (Patient taking differently: Inhale 2 puffs into the lungs daily. ) 1 g 3  . XARELTO 20 MG TABS tablet TAKE 1 TABLET BY MOUTH EVERY DAY AFTER SUPPER 90 tablet 3   No current facility-administered medications for this visit.    Facility-Administered Medications Ordered in Other Visits  Medication Dose Route Frequency Provider Last Rate Last Dose  . 0.9 %  sodium chloride infusion (Manually program via Guardrails IV Fluids)  250 mL Intravenous Once Heath Lark, MD   Stopped at 12/16/18 1500    PHYSICAL EXAMINATION: ECOG PERFORMANCE STATUS: 2 - Symptomatic, <50% confined to bed  Vitals:   03/16/19 0855  BP: 127/70  Pulse: 83  Resp: 18  Temp: 98.5 F (36.9 C)  SpO2: 100%   Filed Weights    GENERAL:alert, no distress and  comfortable SKIN: skin color, texture, turgor are normal, no rashes or significant lesions EYES: normal, Conjunctiva are pink and non-injected, sclera clear OROPHARYNX:no exudate, no erythema and lips, buccal mucosa, and tongue normal  NECK: supple, thyroid normal size, non-tender, without nodularity LYMPH:  no palpable lymphadenopathy in the cervical, axillary or inguinal LUNGS: She has reduced breath sounds on both lung bases.  Diffuse wheezes are noted. HEART: regular rate & rhythm and no murmurs and no lower extremity edema ABDOMEN:abdomen soft, non-tender and normal bowel sounds Musculoskeletal:no cyanosis of digits and no clubbing  NEURO: alert & oriented x 3 with fluent speech, no focal motor/sensory deficits  LABORATORY DATA:  I have reviewed the data as listed    Component Value Date/Time   NA 136 03/16/2019 0807   NA 135 (L) 09/24/2017 0945   K 3.6 03/16/2019 0807   K 3.7 09/24/2017 0945   CL 93 (L) 03/16/2019 0807   CO2 34 (H) 03/16/2019 0807   CO2 30 (H) 09/24/2017 0945   GLUCOSE 103 (H) 03/16/2019 0807   GLUCOSE 101 09/24/2017 0945   BUN 18 03/16/2019 0807   BUN 15.3 09/24/2017 0945   CREATININE 1.29 (H) 03/16/2019 0807   CREATININE 1.22 (H) 12/29/2018 0900   CREATININE 0.8 09/24/2017 0945   CALCIUM 8.5 (L) 03/16/2019 0807   CALCIUM 9.1 09/24/2017 0945   PROT 7.3 03/16/2019 0807   PROT 6.6 09/24/2017 0945   ALBUMIN 2.8 (L) 03/16/2019 0807   ALBUMIN 3.3 (L) 09/24/2017 0945   AST 27 03/16/2019 0807   AST 26 12/29/2018 0900   AST 16 09/24/2017 0945   ALT 11 03/16/2019 0807   ALT 21 12/29/2018 0900   ALT 12 09/24/2017 0945   ALKPHOS 68 03/16/2019 0807   ALKPHOS 52 09/24/2017 0945   BILITOT 0.3 03/16/2019 0807   BILITOT 0.2 (L) 12/29/2018 0900   BILITOT 1.13 09/24/2017 0945   GFRNONAA 38 (L) 03/16/2019 0807   GFRNONAA 41 (L) 12/29/2018 0900   GFRAA 44 (L) 03/16/2019 0807   GFRAA 47 (L) 12/29/2018 0900    No results found for: SPEP, UPEP  Lab Results   Component Value Date   WBC 8.0 03/16/2019   NEUTROABS 5.0 03/16/2019   HGB 9.2 (L) 03/16/2019   HCT 31.9 (L) 03/16/2019   MCV 81.4 03/16/2019   PLT 293 03/16/2019      Chemistry      Component Value Date/Time   NA 136 03/16/2019 0807   NA 135 (L) 09/24/2017 0945   K 3.6 03/16/2019 0807   K 3.7 09/24/2017 0945   CL 93 (L) 03/16/2019 0807   CO2 34 (H) 03/16/2019 0807   CO2 30 (H) 09/24/2017 0945   BUN 18 03/16/2019 0807   BUN 15.3 09/24/2017 0945   CREATININE 1.29 (H) 03/16/2019 0807   CREATININE 1.22 (H) 12/29/2018 0900   CREATININE 0.8 09/24/2017 0945      Component Value Date/Time   CALCIUM 8.5 (L) 03/16/2019 0807   CALCIUM 9.1 09/24/2017 0945   ALKPHOS 68 03/16/2019 0807   ALKPHOS 52 09/24/2017 0945   AST 27 03/16/2019 0807   AST 26 12/29/2018 0900   AST 16 09/24/2017 0945   ALT 11 03/16/2019 0807   ALT 21 12/29/2018 0900   ALT 12 09/24/2017 0945   BILITOT 0.3 03/16/2019 0807   BILITOT 0.2 (L) 12/29/2018 0900   BILITOT 1.13 09/24/2017 0945       RADIOGRAPHIC STUDIES: I have personally reviewed the radiological images as listed and agreed with the findings in the report. Dg Chest 2 View  Result Date: 03/11/2019 CLINICAL DATA:  Follow-up pleural effusion EXAM: CHEST - 2 VIEW COMPARISON:  12/11/2018 FINDINGS: Previously seen right chest tubes have been removed. Power port remains in place with its tip in the SVC above the right atrium. There are small bilateral effusions, sub pulmonic, with mild bibasilar atelectasis. Upper lungs  are clear. No acute bone finding. IMPRESSION: Small bilateral subpulmonic effusions with basilar atelectasis. These are probably slightly larger than were seen in March. Electronically Signed   By: Nelson Chimes M.D.   On: 03/11/2019 10:57    All questions were answered. The patient knows to call the clinic with any problems, questions or concerns. No barriers to learning was detected.  I spent 25 minutes counseling the patient face to  face. The total time spent in the appointment was 30 minutes and more than 50% was on counseling and review of test results  Heath Lark, MD 03/17/2019 7:42 AM

## 2019-03-17 NOTE — Telephone Encounter (Signed)
Talk with patient regarding schedule

## 2019-03-17 NOTE — Assessment & Plan Note (Signed)
She has multifactorial pain, component of cancer pain and her chronic pain She is only taking 2 doses of oxycodone daily as needed I reminded the patient that she can take it more frequently or to add over-the-counter analgesics as needed

## 2019-03-17 NOTE — Assessment & Plan Note (Signed)
She has stable chronic constipation We discussed laxative therapy

## 2019-03-17 NOTE — Assessment & Plan Note (Signed)
She is not symptomatic from her anemia today We will transfuse her if hemoglobin is less than 7.5

## 2019-03-17 NOTE — Assessment & Plan Note (Signed)
She has mild wheezes on exam She also have reduced breath sounds on lung bases consistent with pleural effusion She will continue her oxygen therapy as directed

## 2019-03-19 ENCOUNTER — Telehealth: Payer: Self-pay

## 2019-03-19 ENCOUNTER — Telehealth: Payer: Self-pay | Admitting: *Deleted

## 2019-03-19 ENCOUNTER — Other Ambulatory Visit: Payer: Medicare Other

## 2019-03-19 ENCOUNTER — Telehealth: Payer: Self-pay | Admitting: Emergency Medicine

## 2019-03-19 DIAGNOSIS — Z20822 Contact with and (suspected) exposure to covid-19: Secondary | ICD-10-CM

## 2019-03-19 MED ORDER — BENZONATATE 100 MG PO CAPS: 100.0000 mg | ORAL_CAPSULE | Freq: Four times a day (QID) | ORAL | 0 refills | Status: DC | PRN

## 2019-03-19 NOTE — Telephone Encounter (Signed)
Short term, until we get her testing back, I would have her try Tylenol Cold & Flu as directed on the package. Also, if she is having cough, we can order either tessalon perles 136m q6h prn for cough or hycodan 5cc q6h prn cough.

## 2019-03-19 NOTE — Telephone Encounter (Signed)
Called and spoke with pt's daughter [name] who stated that pt has a chronic cough off and on and today. Pt went to [address] [place] today to be tested.  [name] also stated pt also has complaints of sore throat which began today. Pt is also having problems of SOB, and pain under right shoulder blade which [name] states that pt just had a port catheter removed.  Per [facility] test results have not come back and [name] stated she was told that the results could come back within 48-72 hours.  [name] stated that pt is only taking cough drops for the cough. Asked [name] if she believes that pt's throat is sore from drainage and [name] stated that pt said that she does not have any drainage.  Pt is taking all meds as prescribed. Pt has had to use her rescue inhaler twice daily to help with her breathing.  Pt has not had any fever. Pt does have body aches which is due to her fibromyalgia and has chills which [name] states she believes is due to the chemo.  [name] is wanting to know recommendations to help with pt's symptoms. Dr. Lamonte Sakai, please advise on this for pt and daughter [name]. Thank you!

## 2019-03-19 NOTE — Telephone Encounter (Signed)
[phone] scheduled for today at the [facility]. Informed to wear a mask and stay in vehicle.

## 2019-03-19 NOTE — Telephone Encounter (Signed)
[name] called and left a message. [name] started complaining of a sore throat yesterday, that is worse today. She is having chills and her shortness of breath is worse than normal.

## 2019-03-19 NOTE — Telephone Encounter (Signed)
Called and given below message. [name] verbalized understanding. Told [name] if she did not take her Mom to get tested her next treatment would be delayed due having COVID-19 symptoms. She verbalized understanding.

## 2019-03-19 NOTE — Telephone Encounter (Signed)
She needs to go to the ER I do not recommend symptom management clinic to eval

## 2019-03-19 NOTE — Telephone Encounter (Signed)
Spoke with [name] and explained message from Dr. Lamonte Sakai. She agreed and chose to send in Blyn instead of Hycodan. Rx sent to the [facility] in [place]. Nothing further is needed.

## 2019-03-20 LAB — NOVEL CORONAVIRUS, NAA: SARS-CoV-2, NAA: NOT DETECTED

## 2019-03-23 ENCOUNTER — Telehealth: Payer: Self-pay

## 2019-03-23 NOTE — Telephone Encounter (Signed)
Called and spoke with dtr, [name]. She states pt is feeling much better.  SHOB is back to normal  She states they did already notify her pulmonologist and know to f/u if increased [facility] returns. Confirmed appts for 6/22. No other needs at this time.

## 2019-03-23 NOTE — Telephone Encounter (Signed)
-----   Message from Heath Lark, MD sent at 03/23/2019  8:42 AM EDT ----- Regarding: SOB She was SOB last week, neg for COVID testing If she is still SOB, she needs to contact her pulmonologist for eval. It could be recurrent pleural effusion

## 2019-03-30 ENCOUNTER — Other Ambulatory Visit: Payer: Self-pay

## 2019-03-30 ENCOUNTER — Ambulatory Visit (HOSPITAL_COMMUNITY)
Admission: RE | Admit: 2019-03-30 | Discharge: 2019-03-30 | Disposition: A | Discharge status: Home / Self Care | Payer: Medicare Other | Source: Ambulatory Visit | Attending: Hematology and Oncology | Admitting: Hematology and Oncology

## 2019-03-30 ENCOUNTER — Inpatient Hospital Stay: Payer: Medicare Other

## 2019-03-30 ENCOUNTER — Other Ambulatory Visit: Payer: Self-pay | Admitting: Hematology and Oncology

## 2019-03-30 ENCOUNTER — Inpatient Hospital Stay (HOSPITAL_BASED_OUTPATIENT_CLINIC_OR_DEPARTMENT_OTHER): Payer: Medicare Other | Admitting: Hematology and Oncology

## 2019-03-30 ENCOUNTER — Encounter: Payer: Self-pay | Admitting: Hematology and Oncology

## 2019-03-30 VITALS — BP 121/47 | HR 91 | Temp 98.9°F | Resp 18 | Ht 64.0 in

## 2019-03-30 VITALS — BP 134/73 | HR 88 | Temp 98.6°F | Resp 18

## 2019-03-30 DIAGNOSIS — D638 Anemia in other chronic diseases classified elsewhere: Secondary | ICD-10-CM

## 2019-03-30 DIAGNOSIS — Z79811 Long term (current) use of aromatase inhibitors: Secondary | ICD-10-CM

## 2019-03-30 DIAGNOSIS — T451X5A Adverse effect of antineoplastic and immunosuppressive drugs, initial encounter: Secondary | ICD-10-CM

## 2019-03-30 DIAGNOSIS — G629 Polyneuropathy, unspecified: Secondary | ICD-10-CM

## 2019-03-30 DIAGNOSIS — G893 Neoplasm related pain (acute) (chronic): Secondary | ICD-10-CM

## 2019-03-30 DIAGNOSIS — C562 Malignant neoplasm of left ovary: Secondary | ICD-10-CM | POA: Diagnosis not present

## 2019-03-30 DIAGNOSIS — J90 Pleural effusion, not elsewhere classified: Secondary | ICD-10-CM | POA: Diagnosis not present

## 2019-03-30 DIAGNOSIS — Z5111 Encounter for antineoplastic chemotherapy: Secondary | ICD-10-CM | POA: Diagnosis not present

## 2019-03-30 DIAGNOSIS — Z79899 Other long term (current) drug therapy: Secondary | ICD-10-CM

## 2019-03-30 DIAGNOSIS — J449 Chronic obstructive pulmonary disease, unspecified: Secondary | ICD-10-CM

## 2019-03-30 DIAGNOSIS — K5909 Other constipation: Secondary | ICD-10-CM

## 2019-03-30 DIAGNOSIS — D61810 Antineoplastic chemotherapy induced pancytopenia: Secondary | ICD-10-CM

## 2019-03-30 DIAGNOSIS — M797 Fibromyalgia: Secondary | ICD-10-CM

## 2019-03-30 LAB — CBC WITH DIFFERENTIAL/PLATELET
Abs Immature Granulocytes: 0.09 10*3/uL — ABNORMAL HIGH (ref 0.00–0.07)
Basophils Absolute: 0 10*3/uL (ref 0.0–0.1)
Basophils Relative: 1 %
Eosinophils Absolute: 0.3 10*3/uL (ref 0.0–0.5)
Eosinophils Relative: 4 %
HCT: 26 % — ABNORMAL LOW (ref 36.0–46.0)
Hemoglobin: 7.4 g/dL — ABNORMAL LOW (ref 12.0–15.0)
Immature Granulocytes: 1 %
Lymphocytes Relative: 17 %
Lymphs Abs: 1.2 10*3/uL (ref 0.7–4.0)
MCH: 23 pg — ABNORMAL LOW (ref 26.0–34.0)
MCHC: 28.5 g/dL — ABNORMAL LOW (ref 30.0–36.0)
MCV: 80.7 fL (ref 80.0–100.0)
Monocytes Absolute: 0.9 10*3/uL (ref 0.1–1.0)
Monocytes Relative: 13 %
Neutro Abs: 4.8 10*3/uL (ref 1.7–7.7)
Neutrophils Relative %: 64 %
Platelets: 266 10*3/uL (ref 150–400)
RBC: 3.22 MIL/uL — ABNORMAL LOW (ref 3.87–5.11)
RDW: 18 % — ABNORMAL HIGH (ref 11.5–15.5)
WBC: 7.3 10*3/uL (ref 4.0–10.5)
nRBC: 0 % (ref 0.0–0.2)

## 2019-03-30 LAB — SAMPLE TO BLOOD BANK

## 2019-03-30 LAB — COMPREHENSIVE METABOLIC PANEL WITH GFR
ALT: 11 U/L (ref 0–44)
AST: 20 U/L (ref 15–41)
Albumin: 3 g/dL — ABNORMAL LOW (ref 3.5–5.0)
Alkaline Phosphatase: 64 U/L (ref 38–126)
Anion gap: 7 (ref 5–15)
BUN: 18 mg/dL (ref 8–23)
CO2: 33 mmol/L — ABNORMAL HIGH (ref 22–32)
Calcium: 8.6 mg/dL — ABNORMAL LOW (ref 8.9–10.3)
Chloride: 94 mmol/L — ABNORMAL LOW (ref 98–111)
Creatinine, Ser: 1.18 mg/dL — ABNORMAL HIGH (ref 0.44–1.00)
GFR calc Af Amer: 49 mL/min — ABNORMAL LOW
GFR calc non Af Amer: 43 mL/min — ABNORMAL LOW
Glucose, Bld: 109 mg/dL — ABNORMAL HIGH (ref 70–99)
Potassium: 3.9 mmol/L (ref 3.5–5.1)
Sodium: 134 mmol/L — ABNORMAL LOW (ref 135–145)
Total Bilirubin: 0.3 mg/dL (ref 0.3–1.2)
Total Protein: 7.5 g/dL (ref 6.5–8.1)

## 2019-03-30 LAB — PREPARE RBC (CROSSMATCH)

## 2019-03-30 MED ORDER — SODIUM CHLORIDE 0.9% FLUSH
10.0000 mL | Freq: Once | INTRAVENOUS | Status: AC
  Administered 2019-03-30: 10 mL
  Filled 2019-03-30: qty 10

## 2019-03-30 MED ORDER — OXYCODONE HCL 15 MG PO TABS: 15.0000 mg | ORAL_TABLET | ORAL | 0 refills | Status: DC | PRN

## 2019-03-30 MED ORDER — DIPHENHYDRAMINE HCL 25 MG PO CAPS: 25.0000 mg | ORAL_CAPSULE | Freq: Once | ORAL | Status: DC

## 2019-03-30 MED ORDER — SODIUM CHLORIDE 0.9% IV SOLUTION
250.0000 mL | Freq: Once | INTRAVENOUS | Status: AC
  Administered 2019-03-30: 250 mL via INTRAVENOUS
  Filled 2019-03-30: qty 250

## 2019-03-30 MED ORDER — SODIUM CHLORIDE 0.9% FLUSH
10.0000 mL | INTRAVENOUS | Status: DC | PRN
  Administered 2019-03-30: 15:00:00 10 mL
  Filled 2019-03-30: qty 10

## 2019-03-30 MED ORDER — SODIUM CHLORIDE 0.9 % IV SOLN
Freq: Once | INTRAVENOUS | Status: AC
  Administered 2019-03-30: 10:00:00 via INTRAVENOUS
  Filled 2019-03-30: qty 250

## 2019-03-30 MED ORDER — FAMOTIDINE IN NACL 20-0.9 MG/50ML-% IV SOLN
20.0000 mg | Freq: Once | INTRAVENOUS | Status: AC
  Administered 2019-03-30: 20 mg via INTRAVENOUS

## 2019-03-30 MED ORDER — FAMOTIDINE IN NACL 20-0.9 MG/50ML-% IV SOLN
INTRAVENOUS | Status: AC
  Filled 2019-03-30: qty 50

## 2019-03-30 MED ORDER — SODIUM CHLORIDE 0.9 % IV SOLN
20.0000 mg | Freq: Once | INTRAVENOUS | Status: AC
  Administered 2019-03-30: 20 mg via INTRAVENOUS
  Filled 2019-03-30: qty 2

## 2019-03-30 MED ORDER — DIPHENHYDRAMINE HCL 50 MG/ML IJ SOLN
INTRAMUSCULAR | Status: AC
  Filled 2019-03-30: qty 1

## 2019-03-30 MED ORDER — ACETAMINOPHEN 325 MG PO TABS
ORAL_TABLET | ORAL | Status: AC
  Filled 2019-03-30: qty 2

## 2019-03-30 MED ORDER — HEPARIN SOD (PORK) LOCK FLUSH 100 UNIT/ML IV SOLN
500.0000 [IU] | Freq: Once | INTRAVENOUS | Status: AC | PRN
  Administered 2019-03-30: 500 [IU]
  Filled 2019-03-30: qty 5

## 2019-03-30 MED ORDER — DIPHENHYDRAMINE HCL 50 MG/ML IJ SOLN
50.0000 mg | Freq: Once | INTRAMUSCULAR | Status: AC
  Administered 2019-03-30: 50 mg via INTRAVENOUS

## 2019-03-30 MED ORDER — SODIUM CHLORIDE 0.9 % IV SOLN
64.0000 mg/m2 | Freq: Once | INTRAVENOUS | Status: AC
  Administered 2019-03-30: 126 mg via INTRAVENOUS
  Filled 2019-03-30: qty 21

## 2019-03-30 MED ORDER — ACETAMINOPHEN 325 MG PO TABS
650.0000 mg | ORAL_TABLET | Freq: Once | ORAL | Status: AC
  Administered 2019-03-30: 650 mg via ORAL

## 2019-03-30 NOTE — Patient Instructions (Signed)
[facility] Discharge Instructions for Patients Receiving Chemotherapy  Today you received the following chemotherapy agents: Taxol  To help prevent nausea and vomiting after your treatment, we encourage you to take your nausea medication as directed.   If you develop nausea and vomiting that is not controlled by your nausea medication, call the clinic.   BELOW ARE SYMPTOMS THAT SHOULD BE REPORTED IMMEDIATELY:  *FEVER GREATER THAN 100.5 F  *CHILLS WITH OR WITHOUT FEVER  NAUSEA AND VOMITING THAT IS NOT CONTROLLED WITH YOUR NAUSEA MEDICATION  *UNUSUAL SHORTNESS OF BREATH  *UNUSUAL BRUISING OR BLEEDING  TENDERNESS IN MOUTH AND THROAT WITH OR WITHOUT PRESENCE OF ULCERS  *URINARY PROBLEMS  *BOWEL PROBLEMS  UNUSUAL RASH Items with * indicate a potential emergency and should be followed up as soon as possible.  Feel free to call the clinic should you have any questions or concerns. The clinic phone number is [phone].  Please show the [facility] at check-in to the Emergency Department and triage nurse.   Blood Transfusion, Adult, Care After This sheet gives you information about how to care for yourself after your procedure. Your doctor may also give you more specific instructions. If you have problems or questions, contact your doctor. Follow these instructions at home:   Take over-the-counter and prescription medicines only as told by your doctor.  Go back to your normal activities as told by your doctor.  Follow instructions from your doctor about how to take care of the area where an IV tube was put into your vein (insertion site). Make sure you: ? Wash your hands with soap and water before you change your bandage (dressing). If there is no soap and water, use hand sanitizer. ? Change your bandage as told by your doctor.  Check your IV insertion site every day for signs of infection. Check for: ? More redness, swelling, or pain. ? More fluid or  blood. ? Warmth. ? Pus or a bad smell. Contact a doctor if:  You have more redness, swelling, or pain around the IV insertion site.  You have more fluid or blood coming from the IV insertion site.  Your IV insertion site feels warm to the touch.  You have pus or a bad smell coming from the IV insertion site.  Your pee (urine) turns pink, red, or brown.  You feel weak after doing your normal activities. Get help right away if:  You have signs of a serious allergic or body defense (immune) system reaction, including: ? Itchiness. ? Hives. ? Trouble breathing. ? Anxiety. ? Pain in your chest or lower back. ? Fever, flushing, and chills. ? Fast pulse. ? Rash. ? Watery poop (diarrhea). ? Throwing up (vomiting). ? Dark pee. ? Serious headache. ? Dizziness. ? Stiff neck. ? Yellow color in your face or the white parts of your eyes (jaundice). Summary  After a blood transfusion, return to your normal activities as told by your doctor.  Every day, check for signs of infection where the IV tube was put into your vein.  Some signs of infection are warm skin, more redness and pain, more fluid or blood, and pus or a bad smell where the needle went in.  Contact your doctor if you feel weak or have any unusual symptoms. This information is not intended to replace advice given to you by your health care provider. Make sure you discuss any questions you have with your health care provider. Document Released: 10/15/2014 Document Revised: 05/18/2016  Document Reviewed: 05/18/2016 Elsevier Interactive Patient Education  [employer].

## 2019-03-30 NOTE — Assessment & Plan Note (Signed)
Overall, she tolerated chemotherapy poorly with multiple other symptoms due to her significant comorbidities We will continue chemotherapy today I plan to space out her treatment to every other week along with dose adjustment as prescribed The plan would be to give her about 6 doses of chemotherapy before repeat imaging study

## 2019-03-30 NOTE — Assessment & Plan Note (Signed)
We discussed some of the risks, benefits, and alternatives of blood transfusions. The patient is symptomatic from anemia and the hemoglobin level is critically low.  Some of the side-effects to be expected including risks of transfusion reactions, chills, infection, syndrome of volume overload and risk of hospitalization from various reasons and the patient is willing to proceed and went ahead to sign consent today. She will receive a unit of blood along with chemotherapy today

## 2019-03-30 NOTE — Assessment & Plan Note (Signed)
She has multifactorial pain, component of cancer pain and her chronic pain I have refilled her prescription oxycodone to take as needed

## 2019-03-30 NOTE — Assessment & Plan Note (Signed)
She had recent exacerbation of chronic constipation We discussed laxative therapy.

## 2019-03-30 NOTE — Assessment & Plan Note (Signed)
She has reduced breath sounds on exam on both lung bases She will continue oxygen therapy I recommend a chest x-ray today for further evaluation

## 2019-03-30 NOTE — Progress Notes (Signed)
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[name]) Overall, she tolerated chemotherapy poorly with multiple other symptoms due to her significant comorbidities We will continue chemotherapy today I plan to space out her treatment to every other week along with dose adjustment as prescribed The plan would be to give her about 6 doses of chemotherapy before repeat imaging study  Cancer associated pain She has multifactorial pain, component of cancer pain and her chronic pain I have refilled her prescription oxycodone to take as needed   Pleural effusion She has reduced breath sounds on exam on both lung bases She will continue oxygen therapy I recommend a chest x-ray today for further evaluation  Anemia, chronic disease We discussed some of the risks, benefits, and alternatives of blood transfusions. The patient is symptomatic from anemia and the hemoglobin level is critically low.  Some of the side-effects to be expected including risks of transfusion reactions, chills, infection, syndrome of volume overload and risk of hospitalization from various reasons and the patient is willing to proceed and went ahead to sign consent today. She will receive a unit of blood along with chemotherapy today  Other constipation She had recent exacerbation of chronic constipation We discussed laxative therapy.   Orders Placed This Encounter  Procedures  . DG Chest 2 View    Standing Status:   Future    Standing Expiration Date:   05/03/2020    Order Specific Question:   Reason for exam:    Answer:   COPD, pleural  effusion    Order Specific Question:   Preferred imaging location?    Answer:   [place] order/instruction    Transfuse Parameters    Standing Status:   Future    Standing Expiration Date:   03/04/2020  . Prepare RBC    Standing Status:   Standing    Number of Occurrences:   1    Order Specific Question:   # of Units    Answer:   1 unit    Order Specific Question:   Transfusion Indications    Answer:   Symptomatic Anemia    Order Specific Question:   Special Requirements    Answer:   Irradiated    Order Specific Question:   If emergent release call blood bank    Answer:   Not emergent release    INTERVAL HISTORY: Please see below for problem oriented charting. 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 Interim finding of moderate ascites within the abdomen and pelvis with additional finding of diffuse nodular infiltration of the omentum and anterior mesenteric fat, the appearance would be consistent with peritoneal carcinomatosis/metastatic disease. Increasing retroperitoneal and upper abdominal adenopathy. 2.  Re- demonstrated 3.8 cm cyst in the right adnexa. Enlarging soft tissue density in the left adnexa now with possible cystic component posteriorly. In light of the above findings, concern is for ovarian neoplasm. Correlation with pelvic ultrasound recommended. 3. Small right-sided pleural effusion, new since prior study 4. Stable hypodense splenic lesions since 2017.    06/25/2017 Imaging   US pelvis: 2.9 cm simple appearing cyst in the right ovary. Left ovary grossly unremarkable. Large volume ascites in the pelvis   06/30/2017 - 07/01/2017 Hospital Admission   She was admitted for evaluation of abdominal pain and ascites   07/01/2017 Pathology Results   PERITONEAL/ASCITIC FLUID(SPECIMEN 1 OF 1 COLLECTED 07/01/17): - POORLY DIFFERENTIATED CARCINOMA; SEE COMMENT Source Peritoneal/Ascitic Fluid, (specimen 1 of 1 collected 07/01/17) Gross Specimen: Received is/are 1000 cc's of brownish fluid. (BS:bs) Prepared: # Smears: 0 # Concentration Technique Slides (i.e. ThinPrep): 1 # Cell Block: 1 Additional Studies: Also received Hematology slide - [id number]. Comment The tumor cells are positive for cytokeratin 7 and Pax-8 but negative for cytokeratin 20, CDX-2, GATA-3, Napsin-A and TTF-1. Based on the immunoprofile a gynecology primary is favored   07/01/2017 Procedure   Successful ultrasound-guided diagnostic and therapeutic paracentesis yielding 2.5 liters of peritoneal fluid   07/07/2017 - 07/09/2017 Hospital Admission   She was admitted for management of malignant ascites   07/08/2017 Procedure   Successful ultrasound-guided therapeutic paracentesis yielding 2.7 liters liters of peritoneal fluid   07/12/2017 Procedure   Successful ultrasound-guided paracentesis yielding 1450 mL of peritoneal fluid   07/18/2017 - 07/24/2017 Hospital Admission   She was admitted for expedited treatment   07/18/2017 Tumor Marker   Patient's tumor was tested for the following markers: CA125 Results of the tumor  marker test revealed 1941   07/19/2017 - 02/04/2018 Chemotherapy   The patient had 6 cycles of carboplatin & Taxol for chemotherapy treatment, followed by 3 more cycles of carboplatin only    07/19/2017 - 02/04/2018 Chemotherapy   The patient had carboplatin and taxol   08/06/2017 Procedure   Successful ultrasound-guided therapeutic paracentesis yielding 2.6 liters of peritoneal fluid.   08/09/2017 Tumor Marker   Patient's tumor was tested for the following markers: CA125 Results of the tumor marker test revealed 1665   08/15/2017 Tumor Marker   Patient's tumor was tested for the following markers: CA125 Results of the tumor marker test revealed 937.9   08/20/2017 Imaging   ECHO: Normal LV size with EF 60-65%. Normal RV size and systolic function. No significant valvular abnormalities.   09/18/2017 Imaging   Chest Impression:  1. No evidence thoracic metastasis. 2. Interval increase and RIGHT pleural effusion.  Abdomen / Pelvis Impression:  1. Interval decrease in intraperitoneal free fluid. 2. Interval decrease in omental nodularity in the LEFT ventral peritoneal space. 3. Interval decrease in nodularity associated with the LEFT ovary. 4. Cystic portion of the RIGHT ovary is increased mildly in size.   09/20/2017 Tumor Marker   Patient's tumor was tested for the following markers: CA125 Results of the tumor marker test revealed 347   10/14/2017 Tumor Marker  Patient's tumor was tested for the following markers: CA125 Results of the tumor marker test revealed 307.4   11/04/2017 Tumor Marker   Patient's tumor was tested for the following markers: CA125 Results of the tumor marker test revealed 262.5   11/28/2017 Imaging   1. Interval decrease in right pleural effusion with resolution of right atelectasis seen previously. 2. New small left pleural effusion, symmetric to the right. 3. No intraperitoneal free fluid on the current study. 4. Continued further decrease in left  omental disease, appearing less confluent today than on the prior study. 5. Left ovary remains normal in appearance today and the right adnexal cystic lesion is decreased in size compared to prior study. 6. 14 mm pancreatic cyst is unchanged. Continued attention on follow-up imaging recommended. 7. Aortic Atherosclerois (ICD10-170.0)   12/13/2017 Tumor Marker   Patient's tumor was tested for the following markers: CA125 Results of the tumor marker test revealed 197.7   01/03/2018 Tumor Marker   Patient's tumor was tested for the following markers: CA125 Results of the tumor marker test revealed 183.1   01/14/2018 Tumor Marker   Patient's tumor was tested for the following markers: CA125 Results of the tumor marker test revealed 177.4   02/04/2018 Tumor Marker   Patient's tumor was tested for the following markers: CA125 Results of the tumor marker test revealed 168.5   02/25/2018 Imaging   1. Omental carcinomatosis appears qualitatively stable to slightly decreased. Stable mild peritoneal thickening in the paracolic gutters. Stable right adnexal cyst. No ascites. No new or progressive metastatic disease in the abdomen or pelvis. 2. Small dependent right pleural effusion is increased. 3. Cystic pancreatic body lesion is decreased and now subcentimeter in size, suggesting a benign lesion. 4. Aortic Atherosclerosis (ICD10-I70.0).   03/03/2018 - 03/07/2018 Hospital Admission   She was hospitalized for GI bleed requiring blood transfusions. Xarelto was placed on hold   03/07/2018 PET scan   1. Persistent hazy omental interstitial nodularity but no hypermetabolism or discrete measurable nodules. No abdominal ascites. 2. No findings for metastatic disease involving the chest. 3. Moderate-sized right pleural effusion and small left pleural effusion.    03/20/2018 Pathology Results   1. Ovary and fallopian tube, right - OVARY AND FALLOPIAN TUBE INVOLVED BY SEROUS CARCINOMA. - PARATUBAL  CYST. 2. Uterus +/- tubes/ovaries, neoplastic, cervix, left ovary and fallopian tube - LEFT OVARY: HIGH GRADE SEROUS CARCINOMA WITH TREATMENT EFFECT, SPANNING 2.5 CM. CARCINOMA INVOLVES OVARIAN SURFACE. SEE ONCOLOGY TABLE. - LEFT FALLOPIAN TUBE: INVOLVED BY SEROUS CARCINOMA. - UTERUS: -ENDOMETRIUM: INACTIVE ENDOMETRIUM. NO HYPERPLASIA OR MALIGNANCY. -MYOMETRIUM: UNREMARKABLE. NO MALIGNANCY. -SEROSA: INVOLVED BY SEROUS CARCINOMA. - CERVIX: ENDOCERVICAL POLYP. NO MALIGNANCY. 3. Omentum, resection for tumor - INVOLVED BY SEROUS CARCINOMA. 4. Soft tissue, biopsy, mesenteric nodule - INVOLVED BY SEROUS CARCINOMA. Microscopic Comment 2. OVARY or FALLOPIAN TUBE or PRIMARY PERITONEUM: Procedure: Total hysterectomy and bilateral salpingo-oophorectomy. Omentectomy. Mesenteric lymph node biopsy. Specimen Integrity: Intact. Tumor Site: Left ovary. Ovarian Surface Involvement (required only if applicable): Present. Fallopian Tube Surface Involvement (required only if applicable): Present, bilateral. Tumor Size: 2.5 cm. Histologic Type: High grade serous carcinoma. Histologic Grade: High grade. Implants (required for advanced stage serous/seromucinous borderline tumors only): N/A. Other Tissue/ Organ Involvement: Bilateral fallopian tubes, right ovary, uterine serosa, omentum. Largest Extrapelvic Peritoneal Focus (required only if applicable): Microscopic, estimated 0.5 cm (omentum). Peritoneal/Ascitic Fluid: Prior Positive ([id number]). Treatment Effect (required only for high-grade serous carcinomas): Present in left ovary. CRS2. Regional Lymph Nodes: No lymph nodes submitted/identified. Pathologic Stage Classification (  pTNM, AJCC 8th Edition): ypT3b, ypNX Representative Tumor Block: 1A, 1B, 26F, 27F. Comment(s): The right ovary has only surface deposits with a large paratubal cyst. The left ovary has intraparenchymal tumor with associated treatment effect. Thus the tumor location is classified as  a left ovarian primary.   03/20/2018 Surgery   Procedure(s) Performed:  1. Exploratory laparotomy with total hysterectomy and bilateral salpingo-oophorectomy 2. Infragastic Omentectomy  3. Debulking to <1cm gross residual disease   Surgeon: Mart Piggs, MD  Specimens: Uterus Cervix, Bilateral tubes / ovaries and omentum. Mesenteric nodule.  Operative Findings: Debulked to gross residual disease <1cm; however there is miliary disease in multiple locations including the majority of the abdominal peritoneum (anterior abdominal wall, bilateral gutters), diaphragm (Right>left), majority of small bowel mesentary. Normal appendix. Normal small uterus. Right ovary with a cystic lesion ~3cm, some adhesive disease of right adnexa to rectum/sigmoid. Gross omental disease, which was resected with the omentectomy. Smooth liver surface, but again, diaphragmatic disease noted.      03/20/2018 Genetic Testing   Patient has genetic testing done for ER/PR. Results revealed patient has ER: 90%, PR 0%.    03/31/2018 Tumor Marker   Patient's tumor was tested for the following markers: CA125 Results of the tumor marker test revealed 215.8    Genetic Testing   Patient has genetic testing done for BRCA 1. Results revealed patient has the following: BRCA 1: no loss of expression.    Genetic Testing   Patient has genetic testing done for MMR . Results revealed patient has the following:  MMR: normal   03/31/2018 Genetic Testing   Patient has genetic testing done for BRCA1/2. Results revealed patient has no actionable mutations. She is found to have [place] genetic change of unknown significance   04/21/2018 Imaging   1. No definite findings of residual or recurrent metastatic disease in the abdomen or pelvis status post interval TAHBSO and omentectomy. Stable minimal thickening in the paracolic gutters without discrete peritoneal nodularity. 2. Trace free fluid in the pelvic cul-de-sac. 3.  Stable small dependent right pleural effusion. 4. Subcentimeter pancreatic body cystic lesion is stable to slightly decreased. 5. Aortic Atherosclerosis (ICD10-I70.0).   04/21/2018 Tumor Marker   Patient's tumor was tested for the following markers: CA125 Results of the tumor marker test revealed 187.3   04/23/2018 - 09/12/2018 Anti-estrogen oral therapy   She is placed on Femara   05/08/2018 Procedure   Successful ultrasound guided right thoracentesis yielding 800 mL of pleural fluid. Fluid cytology is negative for malignancy    05/28/2018 Tumor Marker   Patient's tumor was tested for the following markers: CA125 Results of the tumor marker test revealed 190   06/30/2018 Tumor Marker   Patient's tumor was tested for the following markers: CA125 Results of the tumor marker test revealed 148   07/23/2018 Imaging   Status post hysterectomy and bilateral salpingo-oophorectomy.  Very mild peritoneal thickening/nodularity, equivocal but worrisome for very mild peritoneal disease. Attention on follow-up is suggested.  Small right pleural effusion with indwelling pleural drain.   07/23/2018 Tumor Marker   Patient's tumor was tested for the following markers: CA125 Results of the tumor marker test revealed 215   09/11/2018 Tumor Marker   Patient's tumor was tested for the following markers: CA-125. Results of the tumor marker test revealed 1323   09/12/2018 Imaging   1. Mildly progressive ascites with some mild degree of nodularity most appreciable along the left adnexa, right paracolic gutter, right upper quadrant, compatible with  peritoneal spread of tumor. 2. Small right pleural effusion with indwelling right pleural catheter again noted. Mild increase in atelectasis anteriorly at the right lung base. 3. Aortic Atherosclerosis (ICD10-I70.0). Coronary atherosclerosis. 4. Several tiny hypodense lesions in the spleen are technically nonspecific, but stable. 5. Air fluid level in the  rectum compatible with diarrheal process. 6. Prominent bilateral hip arthropathy. Left hip screw noted.   09/22/2018 - 02/02/2019 Chemotherapy   The patient had carboplatin and gemzar   09/29/2018 Tumor Marker   Patient's tumor was tested for the following markers: CA125 Results of the tumor marker test revealed 1219   09/29/2018 Adverse Reaction   Cycle 1 day 8 of Gemzar was omitted due to severe anemia   10/20/2018 Tumor Marker   Patient's tumor was tested for the following markers: CA125 Results of the tumor marker test revealed 402   11/13/2018 Tumor Marker   Patient's tumor was tested for the following markers: CA125 Results of the tumor marker test revealed 437   11/26/2018 Imaging   1. Mild residual peritoneal thickening, overall decreased from 09/11/2018, with resolution of previously seen ascites. 2. Tiny residual right pleural effusion with small bore chest tube in place. 3. Supraumbilical midline ventral hernia has a wide neck and contains unobstructed colon. 4.  Aortic atherosclerosis (ICD10-170.0).    01/05/2019 Tumor Marker   Patient's tumor was tested for the following markers: CA125 Results of the tumor marker test revealed 929   02/02/2019 Tumor Marker   Patient's tumor was tested for the following markers: CA125 Results of the tumor marker test revealed 1198   02/09/2019 Imaging   1. New mild ascites. Mild peritoneal thickening shows no significant change, but remains consistent with peritoneal carcinomatosis. 2. Increased tiny bilateral pleural effusions. 3. Partial small bowel obstruction with transition point in the anterior lower abdomen, just deep to the anterior abdominal wall. 4. Stable small ventral abdominal wall hernia containing transverse colon.   02/17/2019 -  Chemotherapy   The patient had PACLitaxel for chemotherapy treatment.    03/05/2019 Tumor Marker   Patient's tumor was tested for the following markers: CA125 Results of the tumor marker test  revealed 1243   03/11/2019 Imaging   CXR Small bilateral subpulmonic effusions with basilar atelectasis. These are probably slightly larger than were seen in March.     REVIEW OF SYSTEMS:   Constitutional: Denies fevers, chills or abnormal weight loss Eyes: Denies blurriness of vision Ears, nose, mouth, throat, and face: Denies mucositis or sore throat Respiratory: Denies cough, dyspnea or wheezes Cardiovascular: Denies palpitation, chest discomfort or lower extremity swelling Gastrointestinal:  Denies nausea, heartburn or change in bowel habits Skin: Denies abnormal skin rashes Lymphatics: Denies new lymphadenopathy or easy bruising Neurological:Denies numbness, tingling or new weaknesses Behavioral/Psych: Mood is stable, no new changes  All other systems were reviewed with the patient and are negative.  I have reviewed the past medical history, past surgical history, social history and family history with the patient and they are unchanged from previous note.  ALLERGIES:  is allergic to codeine.  MEDICATIONS:  Current Outpatient Medications  Medication Sig Dispense Refill  . albuterol (PROAIR HFA) 108 (90 Base) MCG/ACT inhaler Inhale 2 puffs into the lungs every 6 (six) hours as needed for wheezing or shortness of breath. 1 Inhaler 3  . ALPRAZolam (XANAX) 0.25 MG tablet Take 1 tablet (0.25 mg total) by mouth at bedtime as needed for anxiety. 5 tablet 0  . amoxicillin (AMOXIL) 500 MG capsule Take 2,000 mg  by mouth See admin instructions. Take 4 capsules (2000 mg) by mouth 1 hour prior to dental procedures  1  . azelastine (ASTELIN) 0.1 % nasal spray Place 2 sprays into both nostrils 2 (two) times daily as needed for allergies.     . benzonatate (TESSALON) 100 MG capsule Take 1 capsule (100 mg total) by mouth every 6 (six) hours as needed for cough. 30 capsule 0  . CARTIA XT 120 MG 24 hr capsule Take 120 mg by mouth at bedtime.     . Cholecalciferol (VITAMIN D) 2000 units CAPS Take  2,000 Units by mouth daily.     Marland Kitchen dicyclomine (BENTYL) 10 MG capsule Take 1 capsule (10 mg total) by mouth 2 (two) times daily. 60 capsule 1  . diltiazem (CARDIZEM CD) 180 MG 24 hr capsule TAKE ONE CAPSULE BY MOUTH EVERY MORNING (Patient taking differently: Take 180 mg by mouth daily. ) 90 capsule 3  . diphenhydrAMINE (BENADRYL) 25 mg capsule Take 25 mg by mouth every 8 (eight) hours as needed for itching.     . diphenoxylate-atropine (LOMOTIL) 2.5-0.025 MG tablet Take 1 tablet by mouth 4 (four) times daily. (Patient taking differently: Take 1 tablet by mouth 4 (four) times daily as needed for diarrhea or loose stools. ) 360 tablet 1  . DULoxetine (CYMBALTA) 60 MG capsule TAKE 1 CAPSULE(60 MG) BY MOUTH DAILY (Patient taking differently: Take 60 mg by mouth daily. ) 30 capsule 3  . fluticasone (FLONASE) 50 MCG/ACT nasal spray Place 2 sprays into both nostrils 2 (two) times daily as needed (FOR NASAL CONGESTION.).     Marland Kitchen furosemide (LASIX) 40 MG tablet Take 1 tablet (40 mg total) by mouth daily. 90 tablet 3  . halobetasol (ULTRAVATE) 0.05 % cream Apply 1 application topically 2 (two) times daily as needed (psoriasis).     Marland Kitchen levothyroxine (SYNTHROID, LEVOTHROID) 137 MCG tablet Take 137 mcg by mouth daily before breakfast. For thyroid therapy    . Magnesium 400 MG CAPS Take 400 mg by mouth 2 (two) times a day.     . mirtazapine (REMERON) 15 MG tablet TAKE 1 TABLET(15 MG) BY MOUTH AT BEDTIME (Patient taking differently: Take 15 mg by mouth at bedtime. ) 30 tablet 11  . oxyCODONE (ROXICODONE) 15 MG immediate release tablet Take 1 tablet (15 mg total) by mouth every 4 (four) hours as needed for pain. 60 tablet 0  . polyvinyl alcohol (LIQUIFILM TEARS) 1.4 % ophthalmic solution Place 1 drop into both eyes 2 (two) times daily as needed for dry eyes.     . potassium chloride SA (K-DUR,KLOR-CON) 20 MEQ tablet TAKE 1 TABLET BY MOUTH DAILY AND TAKE 1 ADDITIONAL TABLET WHEN TAKING EXTRA LASIX DOSE (Patient taking  differently: Take 20 mEq by mouth daily. ) 60 tablet 6  . STIOLTO RESPIMAT 2.5-2.5 MCG/ACT AERS INHALE 2 PUFFS INTO THE LUNGS DAILY (Patient taking differently: Inhale 2 puffs into the lungs daily. ) 1 g 3  . XARELTO 20 MG TABS tablet TAKE 1 TABLET BY MOUTH EVERY DAY AFTER SUPPER 90 tablet 3   No current facility-administered medications for this visit.    Facility-Administered Medications Ordered in Other Visits  Medication Dose Route Frequency Provider Last Rate Last Dose  . 0.9 %  sodium chloride infusion (Manually program via Guardrails IV Fluids)  250 mL Intravenous Once Heath Lark, MD   Stopped at 12/16/18 1500  . 0.9 %  sodium chloride infusion (Manually program via Guardrails IV Fluids)  250 mL Intravenous  Once Heath Lark, MD      . acetaminophen (TYLENOL) tablet 650 mg  650 mg Oral Once Alvy Bimler, Jerold Yoss, MD      . dexamethasone (DECADRON) 20 mg in sodium chloride 0.9 % 50 mL IVPB  20 mg Intravenous Once Alvy Bimler, Velia Pamer, MD      . famotidine (PEPCID) IVPB 20 mg premix  20 mg Intravenous Once Alvy Bimler, Omaree Fuqua, MD 100 mL/hr at 03/30/19 1014 20 mg at 03/30/19 1014  . heparin lock flush 100 unit/mL  500 Units Intracatheter Once PRN Alvy Bimler, Varnell Donate, MD      . PACLitaxel (TAXOL) 126 mg in sodium chloride 0.9 % 250 mL chemo infusion (</= 16m/m2)  64 mg/m2 (Treatment Plan Recorded) Intravenous Once Frenchie Dangerfield, MD      . sodium chloride flush (NS) 0.9 % injection 10 mL  10 mL Intracatheter PRN GAlvy Bimler Jalexus Brett, MD        PHYSICAL EXAMINATION: ECOG PERFORMANCE STATUS: 2 - Symptomatic, <50% confined to bed  Vitals:   03/30/19 0914  BP: (!) 121/47  Pulse: 91  Resp: 18  Temp: 98.9 F (37.2 C)  SpO2: 100%   There were no vitals filed for this visit.  GENERAL:alert, no distress and comfortable SKIN: skin color, texture, turgor are normal, no rashes or significant lesions EYES: normal, Conjunctiva are pink and non-injected, sclera clear OROPHARYNX:no exudate, no erythema and lips, buccal mucosa, and tongue  normal  NECK: supple, thyroid normal size, non-tender, without nodularity LYMPH:  no palpable lymphadenopathy in the cervical, axillary or inguinal LUNGS: She has reduced breath sounds on both lung bases Oxygen in situ HEART: regular rate & rhythm and no murmurs and no lower extremity edema ABDOMEN:abdomen soft, non-tender and normal bowel sounds Musculoskeletal:no cyanosis of digits and no clubbing  NEURO: alert & oriented x 3 with fluent speech, no focal motor/sensory deficits  LABORATORY DATA:  I have reviewed the data as listed    Component Value Date/Time   NA 134 (L) 03/30/2019 0855   NA 135 (L) 09/24/2017 0945   K 3.9 03/30/2019 0855   K 3.7 09/24/2017 0945   CL 94 (L) 03/30/2019 0855   CO2 33 (H) 03/30/2019 0855   CO2 30 (H) 09/24/2017 0945   GLUCOSE 109 (H) 03/30/2019 0855   GLUCOSE 101 09/24/2017 0945   BUN 18 03/30/2019 0855   BUN 15.3 09/24/2017 0945   CREATININE 1.18 (H) 03/30/2019 0855   CREATININE 1.22 (H) 12/29/2018 0900   CREATININE 0.8 09/24/2017 0945   CALCIUM 8.6 (L) 03/30/2019 0855   CALCIUM 9.1 09/24/2017 0945   PROT 7.5 03/30/2019 0855   PROT 6.6 09/24/2017 0945   ALBUMIN 3.0 (L) 03/30/2019 0855   ALBUMIN 3.3 (L) 09/24/2017 0945   AST 20 03/30/2019 0855   AST 26 12/29/2018 0900   AST 16 09/24/2017 0945   ALT 11 03/30/2019 0855   ALT 21 12/29/2018 0900   ALT 12 09/24/2017 0945   ALKPHOS 64 03/30/2019 0855   ALKPHOS 52 09/24/2017 0945   BILITOT 0.3 03/30/2019 0855   BILITOT 0.2 (L) 12/29/2018 0900   BILITOT 1.13 09/24/2017 0945   GFRNONAA 43 (L) 03/30/2019 0855   GFRNONAA 41 (L) 12/29/2018 0900   GFRAA 49 (L) 03/30/2019 0855   GFRAA 47 (L) 12/29/2018 0900    No results found for: SPEP, UPEP  Lab Results  Component Value Date   WBC 7.3 03/30/2019   NEUTROABS 4.8 03/30/2019   HGB 7.4 (L) 03/30/2019   HCT 26.0 (L) 03/30/2019  MCV 80.7 03/30/2019   PLT 266 03/30/2019      Chemistry      Component Value Date/Time   NA 134 (L)  03/30/2019 0855   NA 135 (L) 09/24/2017 0945   K 3.9 03/30/2019 0855   K 3.7 09/24/2017 0945   CL 94 (L) 03/30/2019 0855   CO2 33 (H) 03/30/2019 0855   CO2 30 (H) 09/24/2017 0945   BUN 18 03/30/2019 0855   BUN 15.3 09/24/2017 0945   CREATININE 1.18 (H) 03/30/2019 0855   CREATININE 1.22 (H) 12/29/2018 0900   CREATININE 0.8 09/24/2017 0945      Component Value Date/Time   CALCIUM 8.6 (L) 03/30/2019 0855   CALCIUM 9.1 09/24/2017 0945   ALKPHOS 64 03/30/2019 0855   ALKPHOS 52 09/24/2017 0945   AST 20 03/30/2019 0855   AST 26 12/29/2018 0900   AST 16 09/24/2017 0945   ALT 11 03/30/2019 0855   ALT 21 12/29/2018 0900   ALT 12 09/24/2017 0945   BILITOT 0.3 03/30/2019 0855   BILITOT 0.2 (L) 12/29/2018 0900   BILITOT 1.13 09/24/2017 0945       RADIOGRAPHIC STUDIES: I have personally reviewed the radiological images as listed and agreed with the findings in the report. Dg Chest 2 View  Result Date: 03/11/2019 CLINICAL DATA:  Follow-up pleural effusion EXAM: CHEST - 2 VIEW COMPARISON:  12/11/2018 FINDINGS: Previously seen right chest tubes have been removed. Power port remains in place with its tip in the SVC above the right atrium. There are small bilateral effusions, sub pulmonic, with mild bibasilar atelectasis. Upper lungs are clear. No acute bone finding. IMPRESSION: Small bilateral subpulmonic effusions with basilar atelectasis. These are probably slightly larger than were seen in March. Electronically Signed   By: Nelson Chimes M.D.   On: 03/11/2019 10:57    All questions were answered. The patient knows to call the clinic with any problems, questions or concerns. No barriers to learning was detected.  I spent 30 minutes counseling the patient face to face. The total time spent in the appointment was 40 minutes and more than 50% was on counseling and review of test results  Heath Lark, MD 03/30/2019 10:26 AM

## 2019-03-30 NOTE — Progress Notes (Signed)
Per Dr Alvy Bimler OK for chemo today with HGB 7.4.  Pt to get 1 unit of blood in addition to chemo

## 2019-03-31 ENCOUNTER — Telehealth: Payer: Self-pay

## 2019-03-31 LAB — CA 125: Cancer Antigen (CA) 125: 776 U/mL — ABNORMAL HIGH (ref 0.0–38.1)

## 2019-03-31 LAB — BPAM RBC
Blood Product Expiration Date: 202007152359
ISSUE DATE / TIME: 202006221250
Unit Type and Rh: 5100

## 2019-03-31 LAB — TYPE AND SCREEN
ABO/RH(D): O POS
Antibody Screen: NEGATIVE
Unit division: 0

## 2019-03-31 NOTE — Telephone Encounter (Signed)
Called and given below message to daughter. She verbalized understanding.

## 2019-03-31 NOTE — Telephone Encounter (Signed)
-----   Message from Heath Lark, MD sent at 03/31/2019 11:03 AM EDT ----- Regarding: CXR and CA0-125 Let her know CXR showed some effusion but not enough we need to tap CA-125 is better

## 2019-04-01 ENCOUNTER — Telehealth: Payer: Self-pay | Admitting: Emergency Medicine

## 2019-04-01 MED ORDER — ALBUTEROL SULFATE HFA 108 (90 BASE) MCG/ACT IN AERS: 2.0000 | INHALATION_SPRAY | Freq: Four times a day (QID) | RESPIRATORY_TRACT | 5 refills | Status: DC | PRN

## 2019-04-01 NOTE — Telephone Encounter (Signed)
proair Rx sent to pt's preferred pharmacy. Called and spoke with pt's daughter [name] letting her know this had been done and [name] expressed understanding. Nothing further needed.

## 2019-04-08 ENCOUNTER — Other Ambulatory Visit: Payer: Self-pay | Admitting: Hematology and Oncology

## 2019-04-13 ENCOUNTER — Inpatient Hospital Stay (HOSPITAL_BASED_OUTPATIENT_CLINIC_OR_DEPARTMENT_OTHER): Payer: Medicare Other | Admitting: Hematology and Oncology

## 2019-04-13 ENCOUNTER — Inpatient Hospital Stay: Payer: Medicare Other

## 2019-04-13 ENCOUNTER — Inpatient Hospital Stay: Payer: Medicare Other | Attending: Hematology and Oncology

## 2019-04-13 ENCOUNTER — Other Ambulatory Visit: Payer: Self-pay

## 2019-04-13 ENCOUNTER — Encounter: Payer: Self-pay | Admitting: Hematology and Oncology

## 2019-04-13 DIAGNOSIS — D638 Anemia in other chronic diseases classified elsewhere: Secondary | ICD-10-CM

## 2019-04-13 DIAGNOSIS — C562 Malignant neoplasm of left ovary: Secondary | ICD-10-CM

## 2019-04-13 DIAGNOSIS — J439 Emphysema, unspecified: Secondary | ICD-10-CM | POA: Diagnosis not present

## 2019-04-13 DIAGNOSIS — Z5111 Encounter for antineoplastic chemotherapy: Secondary | ICD-10-CM | POA: Diagnosis present

## 2019-04-13 DIAGNOSIS — J90 Pleural effusion, not elsewhere classified: Secondary | ICD-10-CM | POA: Insufficient documentation

## 2019-04-13 DIAGNOSIS — G893 Neoplasm related pain (acute) (chronic): Secondary | ICD-10-CM

## 2019-04-13 DIAGNOSIS — R5381 Other malaise: Secondary | ICD-10-CM | POA: Diagnosis not present

## 2019-04-13 DIAGNOSIS — Z79899 Other long term (current) drug therapy: Secondary | ICD-10-CM

## 2019-04-13 DIAGNOSIS — Z7901 Long term (current) use of anticoagulants: Secondary | ICD-10-CM | POA: Diagnosis not present

## 2019-04-13 DIAGNOSIS — M542 Cervicalgia: Secondary | ICD-10-CM | POA: Diagnosis not present

## 2019-04-13 DIAGNOSIS — R002 Palpitations: Secondary | ICD-10-CM | POA: Diagnosis not present

## 2019-04-13 DIAGNOSIS — I700 Atherosclerosis of aorta: Secondary | ICD-10-CM

## 2019-04-13 DIAGNOSIS — R188 Other ascites: Secondary | ICD-10-CM | POA: Diagnosis not present

## 2019-04-13 DIAGNOSIS — R531 Weakness: Secondary | ICD-10-CM | POA: Insufficient documentation

## 2019-04-13 LAB — CBC WITH DIFFERENTIAL/PLATELET
Abs Immature Granulocytes: 0.07 10*3/uL (ref 0.00–0.07)
Basophils Absolute: 0.1 10*3/uL (ref 0.0–0.1)
Basophils Relative: 1 %
Eosinophils Absolute: 0.2 10*3/uL (ref 0.0–0.5)
Eosinophils Relative: 3 %
HCT: 28.3 % — ABNORMAL LOW (ref 36.0–46.0)
Hemoglobin: 8.5 g/dL — ABNORMAL LOW (ref 12.0–15.0)
Immature Granulocytes: 1 %
Lymphocytes Relative: 19 %
Lymphs Abs: 1.2 10*3/uL (ref 0.7–4.0)
MCH: 23.9 pg — ABNORMAL LOW (ref 26.0–34.0)
MCHC: 30 g/dL (ref 30.0–36.0)
MCV: 79.7 fL — ABNORMAL LOW (ref 80.0–100.0)
Monocytes Absolute: 0.8 10*3/uL (ref 0.1–1.0)
Monocytes Relative: 14 %
Neutro Abs: 3.7 10*3/uL (ref 1.7–7.7)
Neutrophils Relative %: 62 %
Platelets: 223 10*3/uL (ref 150–400)
RBC: 3.55 MIL/uL — ABNORMAL LOW (ref 3.87–5.11)
RDW: 18.3 % — ABNORMAL HIGH (ref 11.5–15.5)
WBC: 6 10*3/uL (ref 4.0–10.5)
nRBC: 0 % (ref 0.0–0.2)

## 2019-04-13 LAB — COMPREHENSIVE METABOLIC PANEL
ALT: 12 U/L (ref 0–44)
AST: 17 U/L (ref 15–41)
Albumin: 3 g/dL — ABNORMAL LOW (ref 3.5–5.0)
Alkaline Phosphatase: 64 U/L (ref 38–126)
Anion gap: 8 (ref 5–15)
BUN: 16 mg/dL (ref 8–23)
CO2: 30 mmol/L (ref 22–32)
Calcium: 8.6 mg/dL — ABNORMAL LOW (ref 8.9–10.3)
Chloride: 97 mmol/L — ABNORMAL LOW (ref 98–111)
Creatinine, Ser: 1.14 mg/dL — ABNORMAL HIGH (ref 0.44–1.00)
GFR calc Af Amer: 51 mL/min — ABNORMAL LOW (ref >60)
GFR calc non Af Amer: 44 mL/min — ABNORMAL LOW (ref >60)
Glucose, Bld: 136 mg/dL — ABNORMAL HIGH (ref 70–99)
Potassium: 3.6 mmol/L (ref 3.5–5.1)
Sodium: 135 mmol/L (ref 135–145)
Total Bilirubin: 0.2 mg/dL — ABNORMAL LOW (ref 0.3–1.2)
Total Protein: 7.5 g/dL (ref 6.5–8.1)

## 2019-04-13 LAB — SAMPLE TO BLOOD BANK

## 2019-04-13 MED ORDER — HEPARIN SOD (PORK) LOCK FLUSH 100 UNIT/ML IV SOLN
500.0000 [IU] | Freq: Once | INTRAVENOUS | Status: AC | PRN
  Administered 2019-04-13: 500 [IU]
  Filled 2019-04-13: qty 5

## 2019-04-13 MED ORDER — SODIUM CHLORIDE 0.9% FLUSH
10.0000 mL | Freq: Once | INTRAVENOUS | Status: AC
  Administered 2019-04-13: 10:00:00 10 mL
  Filled 2019-04-13: qty 10

## 2019-04-13 MED ORDER — FAMOTIDINE IN NACL 20-0.9 MG/50ML-% IV SOLN
INTRAVENOUS | Status: AC
  Filled 2019-04-13: qty 50

## 2019-04-13 MED ORDER — FAMOTIDINE IN NACL 20-0.9 MG/50ML-% IV SOLN
20.0000 mg | Freq: Once | INTRAVENOUS | Status: AC
  Administered 2019-04-13: 20 mg via INTRAVENOUS

## 2019-04-13 MED ORDER — DIPHENHYDRAMINE HCL 50 MG/ML IJ SOLN
INTRAMUSCULAR | Status: AC
  Filled 2019-04-13: qty 1

## 2019-04-13 MED ORDER — SODIUM CHLORIDE 0.9% FLUSH
10.0000 mL | INTRAVENOUS | Status: DC | PRN
  Administered 2019-04-13: 10 mL
  Filled 2019-04-13: qty 10

## 2019-04-13 MED ORDER — SODIUM CHLORIDE 0.9 % IV SOLN
Freq: Once | INTRAVENOUS | Status: AC
  Administered 2019-04-13: 11:00:00 via INTRAVENOUS
  Filled 2019-04-13: qty 250

## 2019-04-13 MED ORDER — FAMOTIDINE IN NACL 20-0.9 MG/50ML-% IV SOLN: 20.0000 mg | Freq: Once | INTRAVENOUS | Status: DC

## 2019-04-13 MED ORDER — SODIUM CHLORIDE 0.9 % IV SOLN
64.0000 mg/m2 | Freq: Once | INTRAVENOUS | Status: AC
  Administered 2019-04-13: 126 mg via INTRAVENOUS
  Filled 2019-04-13: qty 21

## 2019-04-13 MED ORDER — SODIUM CHLORIDE 0.9 % IV SOLN
20.0000 mg | Freq: Once | INTRAVENOUS | Status: AC
  Administered 2019-04-13: 20 mg via INTRAVENOUS
  Filled 2019-04-13: qty 2

## 2019-04-13 MED ORDER — DIPHENHYDRAMINE HCL 50 MG/ML IJ SOLN
50.0000 mg | Freq: Once | INTRAMUSCULAR | Status: AC
  Administered 2019-04-13: 50 mg via INTRAVENOUS

## 2019-04-13 NOTE — Patient Instructions (Signed)
[facility] Discharge Instructions for Patients Receiving Chemotherapy  Today you received the following chemotherapy agents: Taxol  To help prevent nausea and vomiting after your treatment, we encourage you to take your nausea medication as directed.   If you develop nausea and vomiting that is not controlled by your nausea medication, call the clinic.   BELOW ARE SYMPTOMS THAT SHOULD BE REPORTED IMMEDIATELY:  *FEVER GREATER THAN 100.5 F  *CHILLS WITH OR WITHOUT FEVER  NAUSEA AND VOMITING THAT IS NOT CONTROLLED WITH YOUR NAUSEA MEDICATION  *UNUSUAL SHORTNESS OF BREATH  *UNUSUAL BRUISING OR BLEEDING  TENDERNESS IN MOUTH AND THROAT WITH OR WITHOUT PRESENCE OF ULCERS  *URINARY PROBLEMS  *BOWEL PROBLEMS  UNUSUAL RASH Items with * indicate a potential emergency and should be followed up as soon as possible.  Feel free to call the clinic should you have any questions or concerns. The clinic phone number is [phone].  Please show the [facility] at check-in to the Emergency Department and triage nurse.   Blood Transfusion, Adult, Care After This sheet gives you information about how to care for yourself after your procedure. Your doctor may also give you more specific instructions. If you have problems or questions, contact your doctor. Follow these instructions at home:   Take over-the-counter and prescription medicines only as told by your doctor.  Go back to your normal activities as told by your doctor.  Follow instructions from your doctor about how to take care of the area where an IV tube was put into your vein (insertion site). Make sure you: ? Wash your hands with soap and water before you change your bandage (dressing). If there is no soap and water, use hand sanitizer. ? Change your bandage as told by your doctor.  Check your IV insertion site every day for signs of infection. Check for: ? More redness, swelling, or pain. ? More fluid or  blood. ? Warmth. ? Pus or a bad smell. Contact a doctor if:  You have more redness, swelling, or pain around the IV insertion site.  You have more fluid or blood coming from the IV insertion site.  Your IV insertion site feels warm to the touch.  You have pus or a bad smell coming from the IV insertion site.  Your pee (urine) turns pink, red, or brown.  You feel weak after doing your normal activities. Get help right away if:  You have signs of a serious allergic or body defense (immune) system reaction, including: ? Itchiness. ? Hives. ? Trouble breathing. ? Anxiety. ? Pain in your chest or lower back. ? Fever, flushing, and chills. ? Fast pulse. ? Rash. ? Watery poop (diarrhea). ? Throwing up (vomiting). ? Dark pee. ? Serious headache. ? Dizziness. ? Stiff neck. ? Yellow color in your face or the white parts of your eyes (jaundice). Summary  After a blood transfusion, return to your normal activities as told by your doctor.  Every day, check for signs of infection where the IV tube was put into your vein.  Some signs of infection are warm skin, more redness and pain, more fluid or blood, and pus or a bad smell where the needle went in.  Contact your doctor if you feel weak or have any unusual symptoms. This information is not intended to replace advice given to you by your health care provider. Make sure you discuss any questions you have with your health care provider. Document Released: 10/15/2014 Document Revised: 05/18/2016  Document Reviewed: 05/18/2016 Elsevier Interactive Patient Education  [employer].

## 2019-04-13 NOTE — Assessment & Plan Note (Signed)
Overall, she tolerated chemotherapy poorly with multiple other symptoms due to her significant comorbidities We will continue chemotherapy today I plan to space out her treatment to every other week along with dose adjustment as prescribed The plan would be to give her about 6 doses of chemotherapy before repeat imaging study, probably due around August Her recent tumor marker showed improvement

## 2019-04-13 NOTE — Progress Notes (Signed)
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[place]) Overall, she tolerated chemotherapy poorly with multiple other symptoms due to her significant comorbidities We will continue chemotherapy today I plan to space out her treatment to every other week along with dose adjustment as prescribed The plan would be to give her about 6 doses of chemotherapy before repeat imaging study, probably due around August Her recent tumor marker showed improvement  Anemia, chronic disease She was recently transfused Her hemoglobin is improved She does not need blood today She will only proceed with chemo We will give her blood if hemoglobin is less than 8  Cancer associated pain She has multifactorial pain, chronic neck pain reasonably controlled with oxycodone I recommend Tylenol as needed in additional to prescribed oxycodone  Pleural effusion Recent chest x-ray showed minimal pleural effusion She is not symptomatic today Continue close observation   No orders of the defined types were placed in this encounter.   INTERVAL HISTORY: Please see below for problem oriented charting. 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 Interim finding of moderate ascites within the abdomen and pelvis with additional finding of diffuse nodular infiltration of the omentum and anterior mesenteric fat, the appearance would be consistent with peritoneal carcinomatosis/metastatic disease. Increasing retroperitoneal and upper abdominal adenopathy. 2. Re- demonstrated 3.8 cm cyst in the right adnexa. Enlarging soft tissue density in the left adnexa now with possible cystic component posteriorly. In light of the above findings, concern is for ovarian neoplasm. Correlation with pelvic ultrasound recommended. 3. Small right-sided pleural effusion, new since prior study 4. Stable hypodense splenic lesions since 2017.    06/25/2017 Imaging   US pelvis: 2.9 cm simple appearing cyst in the right ovary. Left ovary grossly unremarkable. Large volume ascites in the pelvis   06/30/2017 - 07/01/2017 Hospital Admission   She was admitted for evaluation of abdominal pain and ascites   07/01/2017 Pathology Results   PERITONEAL/ASCITIC FLUID(SPECIMEN 1 OF 1 COLLECTED 07/01/17): - POORLY DIFFERENTIATED CARCINOMA; SEE COMMENT Source Peritoneal/Ascitic Fluid, (specimen 1 of 1 collected 07/01/17) Gross Specimen: Received is/are 1000 cc's of brownish fluid. (BS:bs) Prepared: # Smears: 0 # Concentration Technique Slides (i.e. ThinPrep): 1 # Cell Block: 1 Additional Studies: Also received  Hematology slide - [id number]. Comment The tumor cells are  positive for cytokeratin 7 and Pax-8 but negative for cytokeratin 20, CDX-2, GATA-3, Napsin-A and TTF-1. Based on the immunoprofile a gynecology primary is favored   07/01/2017 Procedure   Successful ultrasound-guided diagnostic and therapeutic paracentesis yielding 2.5 liters of peritoneal fluid   07/07/2017 - 07/09/2017 Hospital Admission   She was admitted for management of malignant ascites   07/08/2017 Procedure   Successful ultrasound-guided therapeutic paracentesis yielding 2.7 liters liters of peritoneal fluid   07/12/2017 Procedure   Successful ultrasound-guided paracentesis yielding 1450 mL of peritoneal fluid   07/18/2017 - 07/24/2017 Hospital Admission   She was admitted for expedited treatment   07/18/2017 Tumor Marker   Patient's tumor was tested for the following markers: CA125 Results of the tumor marker test revealed 1941   07/19/2017 - 02/04/2018 Chemotherapy   The patient had 6 cycles of carboplatin & Taxol for chemotherapy treatment, followed by 3 more cycles of carboplatin only    07/19/2017 - 02/04/2018 Chemotherapy   The patient had carboplatin and taxol   08/06/2017 Procedure   Successful ultrasound-guided therapeutic paracentesis yielding 2.6 liters of peritoneal fluid.   08/09/2017 Tumor Marker   Patient's tumor was tested for the following markers: CA125 Results of the tumor marker test revealed 1665   08/15/2017 Tumor Marker   Patient's tumor was tested for the following markers: CA125 Results of the tumor marker test revealed 937.9   08/20/2017 Imaging   ECHO: Normal LV size with EF 60-65%. Normal RV size and systolic function. No significant valvular abnormalities.   09/18/2017 Imaging   Chest Impression:  1. No evidence thoracic metastasis. 2. Interval increase and RIGHT pleural effusion.  Abdomen / Pelvis Impression:  1. Interval decrease in intraperitoneal free fluid. 2. Interval decrease in omental nodularity in the LEFT ventral peritoneal  space. 3. Interval decrease in nodularity associated with the LEFT ovary. 4. Cystic portion of the RIGHT ovary is increased mildly in size.   09/20/2017 Tumor Marker   Patient's tumor was tested for the following markers: CA125 Results of the tumor marker test revealed 347   10/14/2017 Tumor Marker   Patient's tumor was tested for the following markers: CA125 Results of the tumor marker test revealed 307.4   11/04/2017 Tumor Marker   Patient's tumor was tested for the following markers: CA125 Results of the tumor marker test revealed 262.5   11/28/2017 Imaging   1. Interval decrease in right pleural effusion with resolution of right atelectasis seen previously. 2. New small left pleural effusion, symmetric to the right. 3. No intraperitoneal free fluid on the current study. 4. Continued further decrease in left omental disease, appearing less confluent today than on the prior study. 5. Left ovary remains normal in appearance today and the right adnexal cystic lesion is decreased in size compared to prior study. 6. 14 mm pancreatic cyst is unchanged. Continued attention on follow-up imaging recommended. 7. Aortic Atherosclerois (ICD10-170.0)   12/13/2017 Tumor Marker   Patient's tumor was tested for the following markers: CA125 Results of the tumor marker test revealed 197.7   01/03/2018 Tumor Marker   Patient's tumor was tested for the following markers: CA125 Results of the tumor marker test revealed 183.1   01/14/2018 Tumor Marker   Patient's tumor was tested for the following markers: CA125 Results of the tumor marker test revealed 177.4   02/04/2018 Tumor Marker   Patient's tumor was tested for the following markers: CA125 Results of the tumor marker  test revealed 168.5   02/25/2018 Imaging   1. Omental carcinomatosis appears qualitatively stable to slightly decreased. Stable mild peritoneal thickening in the paracolic gutters. Stable right adnexal cyst. No ascites. No new or  progressive metastatic disease in the abdomen or pelvis. 2. Small dependent right pleural effusion is increased. 3. Cystic pancreatic body lesion is decreased and now subcentimeter in size, suggesting a benign lesion. 4. Aortic Atherosclerosis (ICD10-I70.0).   03/03/2018 - 03/07/2018 Hospital Admission   She was hospitalized for GI bleed requiring blood transfusions. Xarelto was placed on hold   03/07/2018 PET scan   1. Persistent hazy omental interstitial nodularity but no hypermetabolism or discrete measurable nodules. No abdominal ascites. 2. No findings for metastatic disease involving the chest. 3. Moderate-sized right pleural effusion and small left pleural effusion.    03/20/2018 Pathology Results   1. Ovary and fallopian tube, right - OVARY AND FALLOPIAN TUBE INVOLVED BY SEROUS CARCINOMA. - PARATUBAL CYST. 2. Uterus +/- tubes/ovaries, neoplastic, cervix, left ovary and fallopian tube - LEFT OVARY: HIGH GRADE SEROUS CARCINOMA WITH TREATMENT EFFECT, SPANNING 2.5 CM. CARCINOMA INVOLVES OVARIAN SURFACE. SEE ONCOLOGY TABLE. - LEFT FALLOPIAN TUBE: INVOLVED BY SEROUS CARCINOMA. - UTERUS: -ENDOMETRIUM: INACTIVE ENDOMETRIUM. NO HYPERPLASIA OR MALIGNANCY. -MYOMETRIUM: UNREMARKABLE. NO MALIGNANCY. -SEROSA: INVOLVED BY SEROUS CARCINOMA. - CERVIX: ENDOCERVICAL POLYP. NO MALIGNANCY. 3. Omentum, resection for tumor - INVOLVED BY SEROUS CARCINOMA. 4. Soft tissue, biopsy, mesenteric nodule - INVOLVED BY SEROUS CARCINOMA. Microscopic Comment 2. OVARY or FALLOPIAN TUBE or PRIMARY PERITONEUM: Procedure: Total hysterectomy and bilateral salpingo-oophorectomy. Omentectomy. Mesenteric lymph node biopsy. Specimen Integrity: Intact. Tumor Site: Left ovary. Ovarian Surface Involvement (required only if applicable): Present. Fallopian Tube Surface Involvement (required only if applicable): Present, bilateral. Tumor Size: 2.5 cm. Histologic Type: High grade serous carcinoma. Histologic Grade: High  grade. Implants (required for advanced stage serous/seromucinous borderline tumors only): N/A. Other Tissue/ Organ Involvement: Bilateral fallopian tubes, right ovary, uterine serosa, omentum. Largest Extrapelvic Peritoneal Focus (required only if applicable): Microscopic, estimated 0.5 cm (omentum). Peritoneal/Ascitic Fluid: Prior Positive ([id number]). Treatment Effect (required only for high-grade serous carcinomas): Present in left ovary. CRS2. Regional Lymph Nodes: No lymph nodes submitted/identified. Pathologic Stage Classification (pTNM, AJCC 8th Edition): ypT3b, ypNX Representative Tumor Block: 1A, 1B, 53F, 19F. Comment(s): The right ovary has only surface deposits with a large paratubal cyst. The left ovary has intraparenchymal tumor with associated treatment effect. Thus the tumor location is classified as a left ovarian primary.   03/20/2018 Surgery   Procedure(s) Performed:  1. Exploratory laparotomy with total hysterectomy and bilateral salpingo-oophorectomy 2. Infragastic Omentectomy  3. Debulking to <1cm gross residual disease   Surgeon: Mart Piggs, MD  Specimens: Uterus Cervix, Bilateral tubes / ovaries and omentum. Mesenteric nodule.  Operative Findings: Debulked to gross residual disease <1cm; however there is miliary disease in multiple locations including the majority of the abdominal peritoneum (anterior abdominal wall, bilateral gutters), diaphragm (Right>left), majority of small bowel mesentary. Normal appendix. Normal small uterus. Right ovary with a cystic lesion ~3cm, some adhesive disease of right adnexa to rectum/sigmoid. Gross omental disease, which was resected with the omentectomy. Smooth liver surface, but again, diaphragmatic disease noted.      03/20/2018 Genetic Testing   Patient has genetic testing done for ER/PR. Results revealed patient has ER: 90%, PR 0%.    03/31/2018 Tumor Marker   Patient's tumor was tested for the following  markers: CA125 Results of the tumor marker test revealed 215.8    Genetic Testing   Patient has  genetic testing done for BRCA 1. Results revealed patient has the following: BRCA 1: no loss of expression.    Genetic Testing   Patient has genetic testing done for MMR . Results revealed patient has the following:  MMR: normal   03/31/2018 Genetic Testing   Patient has genetic testing done for BRCA1/2. Results revealed patient has no actionable mutations. She is found to have [place] genetic change of unknown significance   04/21/2018 Imaging   1. No definite findings of residual or recurrent metastatic disease in the abdomen or pelvis status post interval TAHBSO and omentectomy. Stable minimal thickening in the paracolic gutters without discrete peritoneal nodularity. 2. Trace free fluid in the pelvic cul-de-sac. 3. Stable small dependent right pleural effusion. 4. Subcentimeter pancreatic body cystic lesion is stable to slightly decreased. 5. Aortic Atherosclerosis (ICD10-I70.0).   04/21/2018 Tumor Marker   Patient's tumor was tested for the following markers: CA125 Results of the tumor marker test revealed 187.3   04/23/2018 - 09/12/2018 Anti-estrogen oral therapy   She is placed on Femara   05/08/2018 Procedure   Successful ultrasound guided right thoracentesis yielding 800 mL of pleural fluid. Fluid cytology is negative for malignancy    05/28/2018 Tumor Marker   Patient's tumor was tested for the following markers: CA125 Results of the tumor marker test revealed 190   06/30/2018 Tumor Marker   Patient's tumor was tested for the following markers: CA125 Results of the tumor marker test revealed 148   07/23/2018 Imaging   Status post hysterectomy and bilateral salpingo-oophorectomy.  Very mild peritoneal thickening/nodularity, equivocal but worrisome for very mild peritoneal disease. Attention on follow-up is suggested.  Small right pleural effusion with indwelling pleural  drain.   07/23/2018 Tumor Marker   Patient's tumor was tested for the following markers: CA125 Results of the tumor marker test revealed 215   09/11/2018 Tumor Marker   Patient's tumor was tested for the following markers: CA-125. Results of the tumor marker test revealed 1323   09/12/2018 Imaging   1. Mildly progressive ascites with some mild degree of nodularity most appreciable along the left adnexa, right paracolic gutter, right upper quadrant, compatible with peritoneal spread of tumor. 2. Small right pleural effusion with indwelling right pleural catheter again noted. Mild increase in atelectasis anteriorly at the right lung base. 3. Aortic Atherosclerosis (ICD10-I70.0). Coronary atherosclerosis. 4. Several tiny hypodense lesions in the spleen are technically nonspecific, but stable. 5. Air fluid level in the rectum compatible with diarrheal process. 6. Prominent bilateral hip arthropathy. Left hip screw noted.   09/22/2018 - 02/02/2019 Chemotherapy   The patient had carboplatin and gemzar   09/29/2018 Tumor Marker   Patient's tumor was tested for the following markers: CA125 Results of the tumor marker test revealed 1219   09/29/2018 Adverse Reaction   Cycle 1 day 8 of Gemzar was omitted due to severe anemia   10/20/2018 Tumor Marker   Patient's tumor was tested for the following markers: CA125 Results of the tumor marker test revealed 402   11/13/2018 Tumor Marker   Patient's tumor was tested for the following markers: CA125 Results of the tumor marker test revealed 437   11/26/2018 Imaging   1. Mild residual peritoneal thickening, overall decreased from 09/11/2018, with resolution of previously seen ascites. 2. Tiny residual right pleural effusion with small bore chest tube in place. 3. Supraumbilical midline ventral hernia has a wide neck and contains unobstructed colon. 4.  Aortic atherosclerosis (ICD10-170.0).    01/05/2019 Tumor Marker  Patient's tumor was tested  for the following markers: CA125 Results of the tumor marker test revealed 929   02/02/2019 Tumor Marker   Patient's tumor was tested for the following markers: CA125 Results of the tumor marker test revealed 1198   02/09/2019 Imaging   1. New mild ascites. Mild peritoneal thickening shows no significant change, but remains consistent with peritoneal carcinomatosis. 2. Increased tiny bilateral pleural effusions. 3. Partial small bowel obstruction with transition point in the anterior lower abdomen, just deep to the anterior abdominal wall. 4. Stable small ventral abdominal wall hernia containing transverse colon.   02/17/2019 -  Chemotherapy   The patient had PACLitaxel for chemotherapy treatment.    03/05/2019 Tumor Marker   Patient's tumor was tested for the following markers: CA125 Results of the tumor marker test revealed 1243   03/11/2019 Imaging   CXR Small bilateral subpulmonic effusions with basilar atelectasis. These are probably slightly larger than were seen in March.   03/30/2019 Tumor Marker   Patient's tumor was tested for the following markers: CA-125 Results of the tumor marker test revealed 776     REVIEW OF SYSTEMS:   Constitutional: Denies fevers, chills or abnormal weight loss Eyes: Denies blurriness of vision Ears, nose, mouth, throat, and face: Denies mucositis or sore throat Respiratory: Denies cough, dyspnea or wheezes Cardiovascular: Denies palpitation, chest discomfort or lower extremity swelling Gastrointestinal:  Denies nausea, heartburn or change in bowel habits Skin: Denies abnormal skin rashes Lymphatics: Denies new lymphadenopathy or easy bruising Neurological:Denies numbness, tingling or new weaknesses Behavioral/Psych: Mood is stable, no new changes  All other systems were reviewed with the patient and are negative.  I have reviewed the past medical history, past surgical history, social history and family history with the patient and they are  unchanged from previous note.  ALLERGIES:  is allergic to codeine.  MEDICATIONS:  Current Outpatient Medications  Medication Sig Dispense Refill  . albuterol (PROAIR HFA) 108 (90 Base) MCG/ACT inhaler Inhale 2 puffs into the lungs every 6 (six) hours as needed for wheezing or shortness of breath. 18 g 5  . ALPRAZolam (XANAX) 0.25 MG tablet Take 1 tablet (0.25 mg total) by mouth at bedtime as needed for anxiety. 5 tablet 0  . amoxicillin (AMOXIL) 500 MG capsule Take 2,000 mg by mouth See admin instructions. Take 4 capsules (2000 mg) by mouth 1 hour prior to dental procedures  1  . azelastine (ASTELIN) 0.1 % nasal spray Place 2 sprays into both nostrils 2 (two) times daily as needed for allergies.     . benzonatate (TESSALON) 100 MG capsule Take 1 capsule (100 mg total) by mouth every 6 (six) hours as needed for cough. 30 capsule 0  . CARTIA XT 120 MG 24 hr capsule Take 120 mg by mouth at bedtime.     . Cholecalciferol (VITAMIN D) 2000 units CAPS Take 2,000 Units by mouth daily.     Marland Kitchen dicyclomine (BENTYL) 10 MG capsule Take 1 capsule (10 mg total) by mouth 2 (two) times daily. 60 capsule 1  . diltiazem (CARDIZEM CD) 180 MG 24 hr capsule TAKE ONE CAPSULE BY MOUTH EVERY MORNING (Patient taking differently: Take 180 mg by mouth daily. ) 90 capsule 3  . diphenhydrAMINE (BENADRYL) 25 mg capsule Take 25 mg by mouth every 8 (eight) hours as needed for itching.     . diphenoxylate-atropine (LOMOTIL) 2.5-0.025 MG tablet Take 1 tablet by mouth 4 (four) times daily. (Patient taking differently: Take 1 tablet by mouth  4 (four) times daily as needed for diarrhea or loose stools. ) 360 tablet 1  . DULoxetine (CYMBALTA) 60 MG capsule TAKE 1 CAPSULE(60 MG) BY MOUTH DAILY (Patient taking differently: Take 60 mg by mouth daily. ) 30 capsule 3  . fluticasone (FLONASE) 50 MCG/ACT nasal spray Place 2 sprays into both nostrils 2 (two) times daily as needed (FOR NASAL CONGESTION.).     Marland Kitchen furosemide (LASIX) 40 MG tablet  Take 1 tablet (40 mg total) by mouth daily. 90 tablet 3  . halobetasol (ULTRAVATE) 0.05 % cream Apply 1 application topically 2 (two) times daily as needed (psoriasis).     Marland Kitchen levothyroxine (SYNTHROID, LEVOTHROID) 137 MCG tablet Take 137 mcg by mouth daily before breakfast. For thyroid therapy    . Magnesium 400 MG CAPS Take 400 mg by mouth 2 (two) times a day.     . mirtazapine (REMERON) 15 MG tablet Take 1 tablet (15 mg total) by mouth at bedtime. 30 tablet 9  . oxyCODONE (ROXICODONE) 15 MG immediate release tablet Take 1 tablet (15 mg total) by mouth every 4 (four) hours as needed for pain. 60 tablet 0  . polyvinyl alcohol (LIQUIFILM TEARS) 1.4 % ophthalmic solution Place 1 drop into both eyes 2 (two) times daily as needed for dry eyes.     . potassium chloride SA (K-DUR,KLOR-CON) 20 MEQ tablet TAKE 1 TABLET BY MOUTH DAILY AND TAKE 1 ADDITIONAL TABLET WHEN TAKING EXTRA LASIX DOSE (Patient taking differently: Take 20 mEq by mouth daily. ) 60 tablet 6  . STIOLTO RESPIMAT 2.5-2.5 MCG/ACT AERS INHALE 2 PUFFS INTO THE LUNGS DAILY (Patient taking differently: Inhale 2 puffs into the lungs daily. ) 1 g 3  . XARELTO 20 MG TABS tablet TAKE 1 TABLET BY MOUTH EVERY DAY AFTER SUPPER 90 tablet 3   No current facility-administered medications for this visit.    Facility-Administered Medications Ordered in Other Visits  Medication Dose Route Frequency Provider Last Rate Last Dose  . 0.9 %  sodium chloride infusion (Manually program via Guardrails IV Fluids)  250 mL Intravenous Once Heath Lark, MD   Stopped at 12/16/18 1500    PHYSICAL EXAMINATION: ECOG PERFORMANCE STATUS: 2 - Symptomatic, <50% confined to bed  Vitals:   04/13/19 1033  BP: (!) 128/51  Pulse: 85  Resp: 18  SpO2: 100%   There were no vitals filed for this visit.  GENERAL:alert, no distress and comfortable SKIN: skin color, texture, turgor are normal, no rashes or significant lesions EYES: normal, Conjunctiva are pink and  non-injected, sclera clear OROPHARYNX:no exudate, no erythema and lips, buccal mucosa, and tongue normal  NECK: supple, thyroid normal size, non-tender, without nodularity LYMPH:  no palpable lymphadenopathy in the cervical, axillary or inguinal LUNGS: clear to auscultation and percussion with normal breathing effort HEART: regular rate & rhythm and no murmurs and no lower extremity edema ABDOMEN:abdomen soft, non-tender and normal bowel sounds Musculoskeletal:no cyanosis of digits and no clubbing  NEURO: alert & oriented x 3 with fluent speech, no focal motor/sensory deficits  LABORATORY DATA:  I have reviewed the data as listed    Component Value Date/Time   NA 135 04/13/2019 1005   NA 135 (L) 09/24/2017 0945   K 3.6 04/13/2019 1005   K 3.7 09/24/2017 0945   CL 97 (L) 04/13/2019 1005   CO2 30 04/13/2019 1005   CO2 30 (H) 09/24/2017 0945   GLUCOSE 136 (H) 04/13/2019 1005   GLUCOSE 101 09/24/2017 0945   BUN 16 04/13/2019  1005   BUN 15.3 09/24/2017 0945   CREATININE 1.14 (H) 04/13/2019 1005   CREATININE 1.22 (H) 12/29/2018 0900   CREATININE 0.8 09/24/2017 0945   CALCIUM 8.6 (L) 04/13/2019 1005   CALCIUM 9.1 09/24/2017 0945   PROT 7.5 04/13/2019 1005   PROT 6.6 09/24/2017 0945   ALBUMIN 3.0 (L) 04/13/2019 1005   ALBUMIN 3.3 (L) 09/24/2017 0945   AST 17 04/13/2019 1005   AST 26 12/29/2018 0900   AST 16 09/24/2017 0945   ALT 12 04/13/2019 1005   ALT 21 12/29/2018 0900   ALT 12 09/24/2017 0945   ALKPHOS 64 04/13/2019 1005   ALKPHOS 52 09/24/2017 0945   BILITOT 0.2 (L) 04/13/2019 1005   BILITOT 0.2 (L) 12/29/2018 0900   BILITOT 1.13 09/24/2017 0945   GFRNONAA 44 (L) 04/13/2019 1005   GFRNONAA 41 (L) 12/29/2018 0900   GFRAA 51 (L) 04/13/2019 1005   GFRAA 47 (L) 12/29/2018 0900    No results found for: SPEP, UPEP  Lab Results  Component Value Date   WBC 6.0 04/13/2019   NEUTROABS 3.7 04/13/2019   HGB 8.5 (L) 04/13/2019   HCT 28.3 (L) 04/13/2019   MCV 79.7 (L)  04/13/2019   PLT 223 04/13/2019      Chemistry      Component Value Date/Time   NA 135 04/13/2019 1005   NA 135 (L) 09/24/2017 0945   K 3.6 04/13/2019 1005   K 3.7 09/24/2017 0945   CL 97 (L) 04/13/2019 1005   CO2 30 04/13/2019 1005   CO2 30 (H) 09/24/2017 0945   BUN 16 04/13/2019 1005   BUN 15.3 09/24/2017 0945   CREATININE 1.14 (H) 04/13/2019 1005   CREATININE 1.22 (H) 12/29/2018 0900   CREATININE 0.8 09/24/2017 0945      Component Value Date/Time   CALCIUM 8.6 (L) 04/13/2019 1005   CALCIUM 9.1 09/24/2017 0945   ALKPHOS 64 04/13/2019 1005   ALKPHOS 52 09/24/2017 0945   AST 17 04/13/2019 1005   AST 26 12/29/2018 0900   AST 16 09/24/2017 0945   ALT 12 04/13/2019 1005   ALT 21 12/29/2018 0900   ALT 12 09/24/2017 0945   BILITOT 0.2 (L) 04/13/2019 1005   BILITOT 0.2 (L) 12/29/2018 0900   BILITOT 1.13 09/24/2017 0945       RADIOGRAPHIC STUDIES: I have personally reviewed the radiological images as listed and agreed with the findings in the report. Dg Chest 2 View  Result Date: 03/30/2019 CLINICAL DATA:  Reduced breath sounds in both lung bases. EXAM: CHEST - 2 VIEW COMPARISON:  03/11/2019 FINDINGS: Injectable port in stable position. Calcific atherosclerotic disease of the aorta. Cardiomediastinal silhouette is normal. Mediastinal contours appear intact. Bilateral small to moderate pleural effusions with bibasilar atelectasis. Osseous structures are without acute abnormality. Soft tissues are grossly normal. IMPRESSION: Bilateral small to moderate pleural effusions with bibasilar atelectasis. Electronically Signed   By: Fidela Salisbury M.D.   On: 03/30/2019 15:40    All questions were answered. The patient knows to call the clinic with any problems, questions or concerns. No barriers to learning was detected.  I spent 15 minutes counseling the patient face to face. The total time spent in the appointment was 20 minutes and more than 50% was on counseling and review of  test results  Heath Lark, MD 04/13/2019 10:50 AM

## 2019-04-13 NOTE — Assessment & Plan Note (Signed)
She was recently transfused Her hemoglobin is improved She does not need blood today She will only proceed with chemo We will give her blood if hemoglobin is less than 8

## 2019-04-13 NOTE — Patient Instructions (Signed)

## 2019-04-13 NOTE — Assessment & Plan Note (Signed)
She has multifactorial pain, chronic neck pain reasonably controlled with oxycodone I recommend Tylenol as needed in additional to prescribed oxycodone

## 2019-04-13 NOTE — Assessment & Plan Note (Signed)
Recent chest x-ray showed minimal pleural effusion She is not symptomatic today Continue close observation

## 2019-04-17 ENCOUNTER — Other Ambulatory Visit: Payer: Self-pay

## 2019-04-17 ENCOUNTER — Emergency Department (HOSPITAL_COMMUNITY)
Admission: EM | Admit: 2019-04-17 | Discharge: 2019-04-17 | Disposition: A | Discharge status: Home / Self Care | Payer: Medicare Other | Attending: Emergency Medicine | Admitting: Emergency Medicine

## 2019-04-17 ENCOUNTER — Emergency Department (HOSPITAL_COMMUNITY): Payer: Medicare Other

## 2019-04-17 ENCOUNTER — Encounter (HOSPITAL_COMMUNITY): Payer: Self-pay

## 2019-04-17 DIAGNOSIS — Z7901 Long term (current) use of anticoagulants: Secondary | ICD-10-CM | POA: Diagnosis not present

## 2019-04-17 DIAGNOSIS — Z96652 Presence of left artificial knee joint: Secondary | ICD-10-CM | POA: Diagnosis not present

## 2019-04-17 DIAGNOSIS — Z8543 Personal history of malignant neoplasm of ovary: Secondary | ICD-10-CM | POA: Diagnosis not present

## 2019-04-17 DIAGNOSIS — Z79899 Other long term (current) drug therapy: Secondary | ICD-10-CM | POA: Diagnosis not present

## 2019-04-17 DIAGNOSIS — I480 Paroxysmal atrial fibrillation: Secondary | ICD-10-CM | POA: Diagnosis not present

## 2019-04-17 DIAGNOSIS — N183 Chronic kidney disease, stage 3 (moderate): Secondary | ICD-10-CM | POA: Insufficient documentation

## 2019-04-17 DIAGNOSIS — R002 Palpitations: Secondary | ICD-10-CM | POA: Diagnosis present

## 2019-04-17 DIAGNOSIS — J449 Chronic obstructive pulmonary disease, unspecified: Secondary | ICD-10-CM | POA: Insufficient documentation

## 2019-04-17 DIAGNOSIS — E039 Hypothyroidism, unspecified: Secondary | ICD-10-CM | POA: Insufficient documentation

## 2019-04-17 DIAGNOSIS — Z87891 Personal history of nicotine dependence: Secondary | ICD-10-CM | POA: Diagnosis not present

## 2019-04-17 DIAGNOSIS — I5032 Chronic diastolic (congestive) heart failure: Secondary | ICD-10-CM | POA: Diagnosis not present

## 2019-04-17 HISTORY — DX: Dependence on supplemental oxygen: Z99.81

## 2019-04-17 LAB — CBC WITH DIFFERENTIAL/PLATELET
Abs Immature Granulocytes: 0.06 10*3/uL (ref 0.00–0.07)
Basophils Absolute: 0 10*3/uL (ref 0.0–0.1)
Basophils Relative: 0 %
Eosinophils Absolute: 0.1 10*3/uL (ref 0.0–0.5)
Eosinophils Relative: 2 %
HCT: 31.3 % — ABNORMAL LOW (ref 36.0–46.0)
Hemoglobin: 9 g/dL — ABNORMAL LOW (ref 12.0–15.0)
Immature Granulocytes: 1 %
Lymphocytes Relative: 21 %
Lymphs Abs: 2 10*3/uL (ref 0.7–4.0)
MCH: 23.7 pg — ABNORMAL LOW (ref 26.0–34.0)
MCHC: 28.8 g/dL — ABNORMAL LOW (ref 30.0–36.0)
MCV: 82.6 fL (ref 80.0–100.0)
Monocytes Absolute: 0.3 10*3/uL (ref 0.1–1.0)
Monocytes Relative: 3 %
Neutro Abs: 7 10*3/uL (ref 1.7–7.7)
Neutrophils Relative %: 73 %
Platelets: 281 10*3/uL (ref 150–400)
RBC: 3.79 MIL/uL — ABNORMAL LOW (ref 3.87–5.11)
RDW: 18.8 % — ABNORMAL HIGH (ref 11.5–15.5)
WBC: 9.5 10*3/uL (ref 4.0–10.5)
nRBC: 0 % (ref 0.0–0.2)

## 2019-04-17 LAB — BASIC METABOLIC PANEL
Anion gap: 8 (ref 5–15)
BUN: 21 mg/dL (ref 8–23)
CO2: 28 mmol/L (ref 22–32)
Calcium: 8.4 mg/dL — ABNORMAL LOW (ref 8.9–10.3)
Chloride: 100 mmol/L (ref 98–111)
Creatinine, Ser: 1.03 mg/dL — ABNORMAL HIGH (ref 0.44–1.00)
GFR calc Af Amer: 58 mL/min — ABNORMAL LOW (ref >60)
GFR calc non Af Amer: 50 mL/min — ABNORMAL LOW (ref >60)
Glucose, Bld: 107 mg/dL — ABNORMAL HIGH (ref 70–99)
Potassium: 3.7 mmol/L (ref 3.5–5.1)
Sodium: 136 mmol/L (ref 135–145)

## 2019-04-17 LAB — BRAIN NATRIURETIC PEPTIDE: B Natriuretic Peptide: 115 pg/mL — ABNORMAL HIGH (ref 0.0–100.0)

## 2019-04-17 LAB — TROPONIN I (HIGH SENSITIVITY)
Troponin I (High Sensitivity): 7 ng/L (ref <18)
Troponin I (High Sensitivity): 7 ng/L (ref <18)

## 2019-04-17 MED ORDER — DILTIAZEM HCL ER COATED BEADS 180 MG PO CP24: 180.0000 mg | ORAL_CAPSULE | Freq: Every day | ORAL | 0 refills | Status: DC

## 2019-04-17 MED ORDER — DILTIAZEM HCL 100 MG IV SOLR
5.0000 mg/h | INTRAVENOUS | Status: DC
  Administered 2019-04-17: 10:00:00 5 mg/h via INTRAVENOUS

## 2019-04-17 MED ORDER — HEPARIN SOD (PORK) LOCK FLUSH 100 UNIT/ML IV SOLN
500.0000 [IU] | Freq: Once | INTRAVENOUS | Status: AC
  Administered 2019-04-17: 500 [IU] via INTRAVENOUS
  Filled 2019-04-17: qty 5

## 2019-04-17 MED ORDER — DILTIAZEM LOAD VIA INFUSION
10.0000 mg | Freq: Once | INTRAVENOUS | Status: AC
  Administered 2019-04-17: 10 mg via INTRAVENOUS
  Filled 2019-04-17: qty 10

## 2019-04-17 MED ORDER — DILTIAZEM HCL 30 MG PO TABS
60.0000 mg | ORAL_TABLET | Freq: Once | ORAL | Status: AC
  Administered 2019-04-17: 60 mg via ORAL
  Filled 2019-04-17: qty 2

## 2019-04-17 MED ORDER — DILTIAZEM HCL 100 MG IV SOLR
INTRAVENOUS | Status: AC
  Administered 2019-04-17: 5 mg/h via INTRAVENOUS
  Filled 2019-04-17: qty 100

## 2019-04-17 NOTE — ED Notes (Signed)
Pt walked 20 feet with oxygen attached and assistance from [employer]. Pt's HR increased to 120 and respirations to 30. Pt stated she does not normally walk well at home and is no different walking here. HR has now decreased to 107

## 2019-04-17 NOTE — ED Triage Notes (Addendum)
Pt reports that she woke approx 7 am with feeling her heart beating. Pulse was up in the 170's. Reports taking her cardizem this am and her neck hurting. Pt reports she received chemo on monday

## 2019-04-17 NOTE — ED Provider Notes (Signed)
[facility] EMERGENCY DEPARTMENT Provider Note   CSN: [id number] Arrival date & time: 04/17/19  1443     History   Chief Complaint Chief Complaint  Patient presents with  . Atrial Fibrillation    HPI [name] is a 85 y.o. female.      Atrial Fibrillation  Pt was seen at 0910. Per pt, c/o unknown onset and persistence of constant "fast palpitations" that she noticed when she woke up this morning at 0700. Pt states she can "feel her heart beating" in her chest and neck. States she took her HR at home and it was "170." Pt states she took her cardizem this morning. Endorses compliance with xarelto. Denies CP, no cough, denies any change in her usual/chronic SOB, no pedal edema, no abd pain, no N/V/D, no back pain, no injury, no rash, no fevers.    Past Medical History:  Diagnosis Date  . Anxiety   . Chronic blood loss anemia    03-04-2018 diverticular bleed and rectal bleeding,  transfused 2 units PRBCs 03-08-2018  . Colitis   . COPD (chronic obstructive pulmonary disease) (HCC)    Dr. Lamonte Sakai  . Depression   . Diastolic CHF, chronic ([place])   . Diverticulosis   . Family history of colon cancer   . Fibromyalgia   . Genetic testing 04/07/2018   MyRisk (35 genes) @ [facility] - No pathogenic mutations detected  . GERD (gastroesophageal reflux disease)   . Hemorrhoids   . Hiatal hernia   . Hip pain 07/2018   Right Hip Pain  . History of rectal polyps   . History of shingles   . Hypothyroidism   . IBS (irritable bowel syndrome)   . Lymphocytic colitis    Dr. Henrene Pastor  . Malignant ascites    Admission 06/2017 abdominal s/p parencentesis 07-01-2017 2.5L, 07-08-2016  2.7L, 07-12-2017  1435m  . Neuropathy due to chemotherapeutic drug ([place]   . On home O2   . Osteoporosis   . Ovarian cancer ([facility]    Chemotherapy - Dr. GAlvy Bimler . Paroxysmal atrial fibrillation ([place]    Xarelto stopped 03-07-2018 due to lower GI bleed  . Pleural effusion    s/p  right thoracentesis,  02-2018 1.3L and 03-17-2018 right thoracentesis 646m, post cxr no residual effusion  . Psoriatic arthritis (HCCanton City  . Schatzki's ring 2013  . Seasonal allergic rhinitis     Patient Active Problem List   Diagnosis Date Noted  . UTI (urinary tract infection) 12/29/2018  . Mucositis due to chemotherapy 12/09/2018  . CKD (chronic kidney disease) stage 3, GFR 30-59 ml/min (HCC) 10/14/2018  . Chronic atrial fibrillation 10/14/2018  . Hypomagnesemia 09/22/2018  . Cancer associated pain 09/12/2018  . Goals of care, counseling/discussion 09/12/2018  . Other constipation 05/28/2018  . Psoriatic arthritis (HC[place]om[place]07/16/2019  . Genetic testing 04/07/2018  . Family history of colon cancer   . History of GI diverticular bleed 03/12/2018  . Lower GI bleed   . Diverticulosis of colon with hemorrhage   . Rectal bleeding 03/04/2018  . Acute GI bleeding 03/04/2018  . Thrombocytopenia (HCMason03/29/2019  . Anemia, chronic disease 12/13/2017  . Mild protein-calorie malnutrition (HCGrant[place]03/05/2018  . Peripheral neuropathy due to chemotherapy (HCFort Pol[place]03/05/2018  . S/P thoracentesis   . Atrial fibrillation with RVR (HCStover02/10/2017  . HCAP (healthcare-associated pneumonia) 11/08/2017  . Pleural effusion 09/20/2017  . Acute on chronic diastolic heart failure (HCScalp Level11/09/2017  . Pancytopenia, acquired (HCCherry Hill [place]11/05/2017  . Protein-calorie  malnutrition, moderate ([place]) 07/30/2017  . Generalized weakness 07/30/2017  . Antineoplastic chemotherapy induced pancytopenia ([place]) 07/20/2017  . Left ovarian epithelial cancer ([place]) 07/18/2017  . PNA (pneumonia) 07/07/2017  . Ascites 06/30/2017  . Ascites, malignant 06/30/2017  . Elevated CA-125 01/18/2017  . Depression 12/23/2015  . Peripheral edema 08/23/2015  . Fracture of hip, left, closed ([place]) 12/08/2013  . Hip fracture ([place]) 12/08/2013  . PAD (peripheral artery disease) ([place]) 11/25/2013  . Encounter for therapeutic drug monitoring 11/09/2013  . Cough  03/20/2013  . Total knee replacement status 10/07/2012  . Knee pain 10/07/2012  . Knee stiffness 10/07/2012  . Tachycardia 09/17/2012  . Chest pain 09/17/2012  . Difficulty in walking(719.7) 09/16/2012  . Muscle weakness (generalized) 09/16/2012  . Postop Acute blood loss anemia 08/08/2012  . Instability of prosthetic knee ([place]) 08/06/2012  . Bloating 02/14/2012  . Upper abdominal pain 02/14/2012  . Allergic rhinitis, seasonal 02/15/2011  . COLITIS 02/20/2010  . Diarrhea 01/02/2010  . ABDOMINAL PAIN -GENERALIZED 01/02/2010  . PERSONAL HX COLONIC POLYPS 01/02/2010  . Hypothyroidism 12/28/2009  . COPD (chronic obstructive pulmonary disease) ([place]) 12/28/2009  . Arthropathy 12/28/2009    Past Surgical History:  Procedure Laterality Date  . CARDIOVASCULAR STRESS TEST  09/23/2012   Low risk lexiscan nuclear study w/ apical thinning but no evidence of ischemia/  normal LV function and wall motion , ef 75%  . CATARACT EXTRACTION W/ INTRAOCULAR LENS  IMPLANT, BILATERAL  10/2016  . CHEST TUBE INSERTION Right 06/24/2018   Procedure: INSERTION PLEURAL DRAINAGE CATHETER;  Surgeon: Grace Isaac, MD;  Location: [facility];  Service: Thoracic;  Laterality: Right;  . COLONOSCOPY    . DEBULKING N/A 03/20/2018   Procedure: DEBULKING;  Surgeon: Isabel Caprice, MD;  Location: [facility];  Service: Gynecology;  Laterality: N/A;  . EXAM UNDER ANESTHESIA WITH MANIPULATION OF KNEE Left 12-20-2003  dr Noemi Chapel   post TKA  . FEMUR IM NAIL Left 12/11/2013   Procedure: INTRAMEDULLARY (IM) NAIL FEMORAL;  Surgeon: [name], MD;  [facility];  Service: Orthopedics;  Laterality: Left;  . HYSTERECTOMY ABDOMINAL WITH SALPINGO-OOPHORECTOMY Bilateral 03/20/2018   Procedure: TOTAL HYSTERECTOMY ABDOMINAL WITH BILATERAL  SALPINGO-OOPHORECTOMY;  Surgeon: Isabel Caprice, MD;  [facility];  Service: Gynecology;  Laterality: Bilateral;  . IR FLUORO GUIDE PORT INSERTION RIGHT  07/22/2017  . IR PARACENTESIS   07/12/2017  . IR THORACENTESIS ASP PLEURAL SPACE W/IMG GUIDE  03/17/2018  . IR THORACENTESIS ASP PLEURAL SPACE W/IMG GUIDE  05/08/2018  . IR US GUIDE VASC ACCESS RIGHT  07/22/2017  . KNEE ARTHROSCOPY W/ LATERAL RELEASE Left 09-03-2005   dr Noemi Chapel  [facility]   w/  Lysis Adhesions,  excision loose body's  . LAPAROSCOPIC CHOLECYSTECTOMY  12-04-2010  dr zeigler  . LAPAROTOMY N/A 03/20/2018   Procedure: EXPLORATORY LAPAROTOMY;  Surgeon: Isabel Caprice, MD;  [facility];  Service: Gynecology;  Laterality: N/A;  . OMENTECTOMY N/A 03/20/2018   Procedure: OMENTECTOMY;  Surgeon: Isabel Caprice, MD;  [facility];  Service: Gynecology;  Laterality: N/A;  . OTHER SURGICAL HISTORY  06/24/2018   Plurex Catheter inserted into the lungs  . TOTAL KNEE ARTHROPLASTY Left 09-08-2003   dr Noemi Chapel  [facility]  . TOTAL KNEE REVISION  08/06/2012   Procedure: TOTAL KNEE REVISION;  Surgeon: [name], MD;  [facility];  Service: Orthopedics;  Laterality: Left;  Left Total Knee Arthroplasty Revision  . TRANSTHORACIC ECHOCARDIOGRAM  08/20/2017   ef 60-65%,  grade 1 diastolic dysfunction/  trivial AR and TR  . Uterine polypectomy       OB History   No obstetric history on file.      Home Medications    Prior to Admission medications   Medication Sig Start Date End Date Taking? Authorizing Provider  albuterol (PROAIR HFA) 108 (90 Base) MCG/ACT inhaler Inhale 2 puffs into the lungs every 6 (six) hours as needed for wheezing or shortness of breath. 04/01/19   Byrum, Rose Fillers, MD  ALPRAZolam Duanne Moron) 0.25 MG tablet Take 1 tablet (0.25 mg total) by mouth at bedtime as needed for anxiety. 11/15/17   Amin, Jeanella Flattery, MD  amoxicillin (AMOXIL) 500 MG capsule Take 2,000 mg by mouth See admin instructions. Take 4 capsules (2000 mg) by mouth 1 hour prior to dental procedures 07/25/18   [provider]  azelastine (ASTELIN) 0.1 % nasal spray Place 2 sprays into both nostrils 2 (two) times daily as needed for  allergies.     [provider]  benzonatate (TESSALON) 100 MG capsule Take 1 capsule (100 mg total) by mouth every 6 (six) hours as needed for cough. 03/19/19   Collene Gobble, MD  CARTIA XT 120 MG 24 hr capsule Take 120 mg by mouth at bedtime.  11/16/18   [provider]  Cholecalciferol (VITAMIN D) 2000 units CAPS Take 2,000 Units by mouth daily.     [provider]  dicyclomine (BENTYL) 10 MG capsule Take 1 capsule (10 mg total) by mouth 2 (two) times daily. 03/03/19   [name], MD  diltiazem (CARDIZEM CD) 180 MG 24 hr capsule TAKE ONE CAPSULE BY MOUTH EVERY MORNING Patient taking differently: Take 180 mg by mouth daily.  02/23/19   Satira Sark, MD  diphenhydrAMINE (BENADRYL) 25 mg capsule Take 25 mg by mouth every 8 (eight) hours as needed for itching.     [provider]  diphenoxylate-atropine (LOMOTIL) 2.5-0.025 MG tablet Take 1 tablet by mouth 4 (four) times daily. Patient taking differently: Take 1 tablet by mouth 4 (four) times daily as needed for diarrhea or loose stools.  08/21/16   [name], MD  DULoxetine (CYMBALTA) 60 MG capsule TAKE 1 CAPSULE(60 MG) BY MOUTH DAILY Patient taking differently: Take 60 mg by mouth daily.  01/27/19   Heath Lark, MD  fluticasone (FLONASE) 50 MCG/ACT nasal spray Place 2 sprays into both nostrils 2 (two) times daily as needed (FOR NASAL CONGESTION.).  11/20/13   [provider]  furosemide (LASIX) 40 MG tablet Take 1 tablet (40 mg total) by mouth daily. 10/13/18   Satira Sark, MD  halobetasol (ULTRAVATE) 0.05 % cream Apply 1 application topically 2 (two) times daily as needed (psoriasis).     [provider]  levothyroxine (SYNTHROID, LEVOTHROID) 137 MCG tablet Take 137 mcg by mouth daily before breakfast. For thyroid therapy    [provider]  Magnesium 400 MG CAPS Take 400 mg by mouth 2 (two) times a day.     [provider]  mirtazapine (REMERON) 15 MG tablet Take 1  tablet (15 mg total) by mouth at bedtime. 04/08/19   Heath Lark, MD  oxyCODONE (ROXICODONE) 15 MG immediate release tablet Take 1 tablet (15 mg total) by mouth every 4 (four) hours as needed for pain. 03/30/19   Heath Lark, MD  polyvinyl alcohol (LIQUIFILM TEARS) 1.4 % ophthalmic solution Place 1 drop into both eyes 2 (two) times daily as needed for dry eyes.  [provider]  potassium chloride SA (K-DUR,KLOR-CON) 20 MEQ tablet TAKE 1 TABLET BY MOUTH DAILY AND TAKE 1 ADDITIONAL TABLET WHEN TAKING EXTRA LASIX DOSE Patient taking differently: Take 20 mEq by mouth daily.  11/10/18   Satira Sark, MD  STIOLTO RESPIMAT 2.5-2.5 MCG/ACT AERS INHALE 2 PUFFS INTO THE LUNGS DAILY Patient taking differently: Inhale 2 puffs into the lungs daily.  02/02/19   Collene Gobble, MD  XARELTO 20 MG TABS tablet TAKE 1 TABLET BY MOUTH EVERY DAY AFTER SUPPER 10/06/18   Satira Sark, MD  Calcium Carbonate (CALCIUM 600) 1500 MG TABS Take 2 tablets by mouth daily.    09/11/18  [provider]    Family History Family History  Problem Relation Age of Onset  . Heart disease Father        pacemaker  . Lung cancer Father        smoker; deceased 36  . Colon cancer Mother 53       2nd rectal ca at 23  . Colon cancer Maternal Grandfather        dx 44s; deceased 46s  . Depression Sister   . Leukemia Maternal Aunt        deceased 84    Social History Social History   Tobacco Use  . Smoking status: Former Smoker    Packs/day: 1.00    Years: 20.00    Pack years: 20.00    Types: Cigarettes    Quit date: 10/08/1980    Years since quitting: 38.5  . Smokeless tobacco: Never Used  Substance Use Topics  . Alcohol use: Not Currently    Comment: rarely, 12-23-15 rarely  . Drug use: No     Allergies   Codeine   Review of Systems Review of Systems ROS: Statement: All systems negative except as marked or noted in the HPI; Constitutional: Negative for fever and chills. ; ; Eyes:  Negative for eye pain, redness and discharge. ; ; ENMT: Negative for ear pain, hoarseness, nasal congestion, sinus pressure and sore throat. ; ; Cardiovascular: +palpitations. Negative for chest pain, diaphoresis, dyspnea and peripheral edema. ; ; Respiratory: Negative for cough, wheezing and stridor. ; ; Gastrointestinal: Negative for nausea, vomiting, diarrhea, abdominal pain, blood in stool, hematemesis, jaundice and rectal bleeding. . ; ; Genitourinary: Negative for dysuria, flank pain and hematuria. ; ; Musculoskeletal: Negative for back pain and neck pain. Negative for swelling and trauma.; ; Skin: Negative for pruritus, rash, abrasions, blisters, bruising and skin lesion.; ; Neuro: Negative for headache, lightheadedness and neck stiffness. Negative for weakness, altered level of consciousness, altered mental status, extremity weakness, paresthesias, involuntary movement, seizure and syncope.       Physical Exam Updated Vital Signs BP (!) 141/95 (BP Location: Right Arm)   Pulse 94   Temp 98 F (36.7 C) (Oral)   Resp (!) 9   Ht _0  (1.626 m)   Wt 77.1 kg   SpO2 99%   BMI 29.18 kg/m    Patient Vitals for the past 24 hrs:  BP Temp Temp src Pulse Resp SpO2 Height Weight  04/17/19 1330 (!) 111/53 - - 76 14 96 % - -  04/17/19 1300 (!) 108/39 - - 73 11 98 % - -  04/17/19 1200 (!) 121/42 - - 95 19 91 % - -  04/17/19 1130 104/84 - - 95 (!) 23 99 % - -  04/17/19 1100 (!) 107/52 - - 88 (!) 9 99 % - -  04/17/19 1032 - - - (!) 107 20 99 % - -  04/17/19 1030 (!) 141/57 - - (!) 121 (!) 36 100 % - -  04/17/19 1000 110/62 - - 92 10 98 % - -  04/17/19 0952 - - - 94 (!) 9 99 % - -  04/17/19 0919 (!) 141/95 98 F (36.7 C) Oral (!) 130 (!) 23 97 % - -  04/17/19 0915 - - - - - - _0  (1.626 m) 77.1 kg     Physical Exam 0915: Physical examination:  Nursing notes reviewed; Vital signs and O2 SAT reviewed;  Constitutional: Well developed, Well nourished, Well hydrated, In no acute distress;  Head:  Normocephalic, atraumatic; Eyes: EOMI, PERRL, No scleral icterus; ENMT: Mouth and pharynx normal, Mucous membranes moist; Neck: Supple, Full range of motion, No lymphadenopathy; Cardiovascular: Irregular tachycardic rate and rhythm, No gallop; Respiratory: Breath sounds clear & equal bilaterally, No wheezes.  Speaking full sentences with ease, Normal respiratory effort/excursion; Chest: Nontender, Movement normal; Abdomen: Soft, Nontender, Nondistended, Normal bowel sounds; Genitourinary: No CVA tenderness; Extremities: Peripheral pulses normal, No tenderness, No edema, No calf edema or asymmetry.; Neuro: AA&Ox3, Major CN grossly intact.  Speech clear. No gross focal motor or sensory deficits in extremities.; Skin: Color normal, Warm, Dry.     ED Treatments / Results  Labs (all labs ordered are listed, but only abnormal results are displayed)   EKG EKG Interpretation  Date/Time:  Friday April 17 2019 09:19:15 EDT  #1 EKG Ventricular Rate:  151 PR Interval:    QRS Duration: 75 QT Interval:  298 QTC Calculation: 473 R Axis:   94 Text Interpretation:  Atrial fibrillation with rapid V-rate Right axis deviation ST depression, probably rate related Baseline wander When compared with ECG of 03/22/2018, 12/17/2017 No significant change was found Confirmed by Francine Graven [phone]) on 04/17/2019 9:30:25 AM     EKG Interpretation  Date/Time:  Friday April 17 2019 10:01:43 EDT  #2 EKG Ventricular Rate:  93 PR Interval:    QRS Duration: 80 QT Interval:  348 QTC Calculation: 433 R Axis:   106 Text Interpretation:  Sinus rhythm Right axis deviation Low voltage, precordial leads When compared with ECG of 07/07/2017, 04/25/2013 No significant change was found Confirmed by Francine Graven [phone]) on 04/17/2019 10:16:00 AM         Radiology   Procedures Procedures (including critical care time)  Medications Ordered in ED Medications  diltiazem (CARDIZEM) 1 mg/mL load via infusion 10  mg (10 mg Intravenous Bolus from Bag 04/17/19 0932)    And  diltiazem (CARDIZEM) 100 mg in dextrose 5 % 100 mL (1 mg/mL) infusion (5 mg/hr Intravenous New Bag/Given 04/17/19 0930)     Initial Impression / Assessment and Plan / ED Course  I have reviewed the triage vital signs and the nursing notes.  Pertinent labs & imaging results that were available during my care of the patient were reviewed by me and considered in my medical decision making (see chart for details).     MDM Reviewed: previous chart, nursing note and vitals Reviewed previous: labs and ECG Interpretation: labs, ECG and x-ray Total time providing critical care: 30-74 minutes. This excludes time spent performing separately reportable procedures and services. Consults: cardiology   CRITICAL CARE Performed by: Francine Graven Total critical care time: 35 minutes Critical care time was exclusive of separately billable procedures and treating other patients. Critical care was necessary to treat or prevent imminent or life-threatening deterioration. Critical care  was time spent personally by me on the following activities: development of treatment plan with patient and/or surrogate as well as nursing, discussions with consultants, evaluation of patient's response to treatment, examination of patient, obtaining history from patient or surrogate, ordering and performing treatments and interventions, ordering and review of laboratory studies, ordering and review of radiographic studies, pulse oximetry and re-evaluation of patient's condition.    Results for orders placed or performed during the hospital encounter of 81/82/99  Basic metabolic panel  Result Value Ref Range   Sodium 136 135 - 145 mmol/L   Potassium 3.7 3.5 - 5.1 mmol/L   Chloride 100 98 - 111 mmol/L   CO2 28 22 - 32 mmol/L   Glucose, Bld 107 (H) 70 - 99 mg/dL   BUN 21 8 - 23 mg/dL   Creatinine, Ser 1.03 (H) 0.44 - 1.00 mg/dL   Calcium 8.4 (L) 8.9 - 10.3  mg/dL   GFR calc non Af Amer 50 (L) >60 mL/min   GFR calc Af Amer 58 (L) >60 mL/min   Anion gap 8 5 - 15  Brain natriuretic peptide  Result Value Ref Range   B Natriuretic Peptide 115.0 (H) 0.0 - 100.0 pg/mL  CBC with Differential  Result Value Ref Range   WBC 9.5 4.0 - 10.5 K/uL   RBC 3.79 (L) 3.87 - 5.11 MIL/uL   Hemoglobin 9.0 (L) 12.0 - 15.0 g/dL   HCT 31.3 (L) 36.0 - 46.0 %   MCV 82.6 80.0 - 100.0 fL   MCH 23.7 (L) 26.0 - 34.0 pg   MCHC 28.8 (L) 30.0 - 36.0 g/dL   RDW 18.8 (H) 11.5 - 15.5 %   Platelets 281 150 - 400 K/uL   nRBC 0.0 0.0 - 0.2 %   Neutrophils Relative % 73 %   Neutro Abs 7.0 1.7 - 7.7 K/uL   Lymphocytes Relative 21 %   Lymphs Abs 2.0 0.7 - 4.0 K/uL   Monocytes Relative 3 %   Monocytes Absolute 0.3 0.1 - 1.0 K/uL   Eosinophils Relative 2 %   Eosinophils Absolute 0.1 0.0 - 0.5 K/uL   Basophils Relative 0 %   Basophils Absolute 0.0 0.0 - 0.1 K/uL   Immature Granulocytes 1 %   Abs Immature Granulocytes 0.06 0.00 - 0.07 K/uL  Troponin I (High Sensitivity)  Result Value Ref Range   Troponin I (High Sensitivity) 7.00 <18 ng/L  Troponin I (High Sensitivity)  Result Value Ref Range   Troponin I (High Sensitivity) 7.00 <18 ng/L   *Note: Due to a large number of results and/or encounters for the requested time period, some results have not been displayed. A complete set of results can be found in Results Review.    Dg Chest Port 1 View Result Date: 04/17/2019 CLINICAL DATA:  85 year old with palpitations. EXAM: PORTABLE CHEST 1 VIEW COMPARISON:  03/30/2019 FINDINGS: Stable position of the right jugular Port-A-Cath. Catheter tip in the lower SVC region. Chronic blunting at the lung bases is unchanged. No focal airspace disease or pulmonary edema. Heart size is stable and within normal limits. Atherosclerotic calcifications at the aortic arch. Bone structures are unremarkable. IMPRESSION: No acute cardiopulmonary disease. Electronically Signed   By: Markus Daft M.D.    On: 04/17/2019 09:51    LARENDA [name] was evaluated in Emergency Department on 04/17/2019 for the symptoms described in the history of present illness. She was evaluated in the context of the global COVID-19 pandemic, which necessitated consideration that the patient  might be at risk for infection with the SARS-CoV-2 virus that causes COVID-19. Institutional protocols and algorithms that pertain to the evaluation of patients at risk for COVID-19 are in a state of rapid change based on information released by regulatory bodies including the CDC and federal and state organizations. These policies and algorithms were followed during the patient's care in the ED.    1015:  H/H, BUN/Cr per baseline.  IV cardizem bolus and gtt given; converted to NSR after short while on drip. Will wean drip to off, ambulate, await delta trop, consult Cards MD.   1100:  Pt's HR increased to 120's when ambulated. Pt also endorsed SOB, but states she "always" is SOB and that is why she has O2 at home. Pt now has been sitting on stretcher, HR 90's, NSR. Pt states she "feels better now." Denies CP. T/C returned from Cards Dr. Harl Bowie, case discussed, including:  HPI, pertinent PM/SHx, VS/PE, dx testing, ED course and treatment:  Requests to dose cardizem (short acting) 71m PO now and obs in ED, if this keeps her HR under control then increase home dose to cardizem (long acting) 2470m f/u office. Pt updated regarding plan and is agreeable.   1345:  PO cardizem given with HR remaining 70-80's. Pt states she does not ambulate at home, only stand/pivots at the most. Pt moved around ED stretcher/bedside to her tolerance now, HR remains 70-80's. Pt states she "feels better" and wants to go home now. 2nd troponin negative. No clear indication for admission at this time. Dx and testing, d/w pt.  Questions answered.  Verb understanding, agreeable to d/c home with outpt f/u.  1405:  Pt's daughter here now, states pt taking cardizem  18033mam and 120m36mm.  T/C returned from Cards Dr. BranHarl Bowiese discussed, including:  HPI, pertinent PM/SHx, VS/PE, dx testing, ED course and treatment, as well as updated information regarding meds:  OK to increase evening dose to 180mg64mu office.   Final Clinical Impressions(s) / ED Diagnoses   Final diagnoses:  None    ED Discharge Orders    None       McManFrancine Graven07/14/20 1607

## 2019-04-17 NOTE — ED Notes (Addendum)
Patient states "I walk very little at home and I don't feel like I can walk here due to unfamiliar.   I mainly stand and turn."  Patient stated she just needed her discharge papers.

## 2019-04-17 NOTE — Discharge Instructions (Signed)
Take the new prescription as directed (new cardizem dose is 149m in the morning and now 1844mbefore bedtime).  Call the Cardiology office today to confirm your scheduled follow up appointment for Monday, July 13th, at 1:30pm.  Return to the Emergency Department immediately sooner if worsening.

## 2019-04-17 NOTE — ED Notes (Signed)
2nd EKG given to Dr. Thurnell Garbe

## 2019-04-18 ENCOUNTER — Telehealth: Payer: Self-pay | Admitting: Physician Assistant

## 2019-04-18 NOTE — Telephone Encounter (Signed)
[name] is a 85 y.o. female with paroxysmal AFib. She was in the ED yesterday with recurrent AFib with RVR.  Her Diltiazem was increased to 180 mg twice daily. Her daughter called today b/c her HR was increasing again.  I advised her to go ahead and give the PM Diltiazem dose now.  She has an appt Monday for follow up.  I advised her to call over the weekend if her HR increases again - we could give her to take Metoprolol tartrate prn at that point. [name], PA-C    04/18/2019 4:00 PM

## 2019-04-20 ENCOUNTER — Ambulatory Visit (INDEPENDENT_AMBULATORY_CARE_PROVIDER_SITE_OTHER): Payer: Medicare Other | Admitting: Physician Assistant

## 2019-04-20 ENCOUNTER — Other Ambulatory Visit: Payer: Self-pay

## 2019-04-20 ENCOUNTER — Encounter: Payer: Self-pay | Admitting: Physician Assistant

## 2019-04-20 VITALS — BP 113/62 | HR 84 | Temp 98.1°F | Ht 65.0 in | Wt 175.9 lb

## 2019-04-20 DIAGNOSIS — I480 Paroxysmal atrial fibrillation: Secondary | ICD-10-CM

## 2019-04-20 DIAGNOSIS — J449 Chronic obstructive pulmonary disease, unspecified: Secondary | ICD-10-CM

## 2019-04-20 DIAGNOSIS — I5032 Chronic diastolic (congestive) heart failure: Secondary | ICD-10-CM

## 2019-04-20 DIAGNOSIS — C569 Malignant neoplasm of unspecified ovary: Secondary | ICD-10-CM | POA: Diagnosis not present

## 2019-04-20 MED ORDER — METOPROLOL TARTRATE 25 MG PO TABS: 25.0000 mg | ORAL_TABLET | Freq: Two times a day (BID) | ORAL | 3 refills | Status: DC | PRN

## 2019-04-20 NOTE — Telephone Encounter (Signed)
Thanks Event organiser!!

## 2019-04-20 NOTE — Patient Instructions (Signed)
Medication Instructions:  Your physician has recommended you make the following change in your medication:  Lopressor 25 mg 2-3 Times daily as needed for palpitations. Call for Shortness of breath or weakness.   If you need a refill on your cardiac medications before your next appointment, please call your pharmacy.   Lab work: NONE   If you have labs (blood work) drawn today and your tests are completely normal, you will receive your results only by: Marland Kitchen MyChart Message (if you have MyChart) OR . A paper copy in the mail If you have any lab test that is abnormal or we need to change your treatment, we will call you to review the results.  Testing/Procedures: NONE   Follow-Up: At [facility], you and your health needs are our priority.  As part of our continuing mission to provide you with exceptional heart care, we have created designated Provider Care Teams.  These Care Teams include your primary Cardiologist (physician) and Advanced Practice Providers (APPs -  Physician Assistants and Nurse Practitioners) who all work together to provide you with the care you need, when you need it. You will need a follow up appointment in 1 months.  Please call our office 2 months in advance to schedule this appointment.  You may see Rozann Lesches, MD or one of the following Advanced Practice Providers on your designated Care Team:   Bernerd Pho, PA-C [facility]) . Ermalinda Barrios, PA-C ([place])  Any Other Special Instructions Will Be Listed Below (If Applicable). Thank you for choosing [facility]!

## 2019-04-20 NOTE — Progress Notes (Signed)
Cardiology Office Note    Date:  04/20/2019   ID:  [name], DOB [birth year], MRN [id number]  PCP:  Asencion Noble, MD  Cardiologist: Rozann Lesches, MD EPS: None  No chief complaint on file.   History of Present Illness:  [name] is a 85 y.o. female with history of paroxysmal atrial fibrillation CHA2DS2-VASc equals 3 on Xarelto, chronic diastolic CHF, history of ovarian cancer undergoing chemotherapy, recurrent right pleural effusion Pleurx catheter in place on Lasix and potassium and followed by Dr. Servando Snare.  Last saw Dr. Domenic Polite 12/10/2018 at which time she was doing well.  Was in the ED 04/17/2019 with recurrent atrial fibrillation with RVR and converted to normal sinus rhythm with IV Cardizem bolus and drip.  Her diltiazem was increased to 180 mg twice daily.  She called in to the office 04/18/2019 complaining of her heart rate increasing again.  Laura Dopp, PA-C told her to take the evening dose of diltiazem at that time.  She was to call if she had a recurrence over the weekend before her visit today.  Patient says yesterday she felt her heart going in/out Afib yest and going fast. Today it's better. Had Taxol therapy last Monday-seems to be worse after chemo.     Past Medical History:  Diagnosis Date  . Anxiety   . Chronic blood loss anemia    03-04-2018 diverticular bleed and rectal bleeding,  transfused 2 units PRBCs 03-08-2018  . Colitis   . COPD (chronic obstructive pulmonary disease) (HCC)    Dr. Lamonte Sakai  . Depression   . Diastolic CHF, chronic ([place])   . Diverticulosis   . Family history of colon cancer   . Fibromyalgia   . Genetic testing 04/07/2018   MyRisk (35 genes) @ [facility] - No pathogenic mutations detected  . GERD (gastroesophageal reflux disease)   . Hemorrhoids   . Hiatal hernia   . Hip pain 07/2018   Right Hip Pain  . History of rectal polyps   . History of shingles   . Hypothyroidism   . IBS (irritable bowel syndrome)   . Lymphocytic  colitis    Dr. Henrene Pastor  . Malignant ascites    Admission 06/2017 abdominal s/p parencentesis 07-01-2017 2.5L, 07-08-2016  2.7L, 07-12-2017  1415m  . Neuropathy due to chemotherapeutic drug ([place]   . On home O2   . Osteoporosis   . Ovarian cancer ([facility]    Chemotherapy - Dr. GAlvy Bimler . Paroxysmal atrial fibrillation ([facility]    Xarelto stopped 03-07-2018 due to lower GI bleed  . Pleural effusion    s/p  right thoracentesis, 02-2018 1.3L and 03-17-2018 right thoracentesis 6474m, post cxr no residual effusion  . Psoriatic arthritis ([facility]  . Schatzki's ring 2013  . Seasonal allergic rhinitis     Past Surgical History:  Procedure Laterality Date  . CARDIOVASCULAR STRESS TEST  09/23/2012   Low risk lexiscan nuclear study w/ apical thinning but no evidence of ischemia/  normal LV function and wall motion , ef 75%  . CATARACT EXTRACTION W/ INTRAOCULAR LENS  IMPLANT, BILATERAL  10/2016  . CHEST TUBE INSERTION Right 06/24/2018   Procedure: INSERTION PLEURAL DRAINAGE CATHETER;  Surgeon: GeGrace IsaacMD;  [place] Service: Thoracic;  Laterality: Right;  . COLONOSCOPY    . DEBULKING N/A 03/20/2018   Procedure: DEBULKING;  Surgeon: PhIsabel CapriceMD;  Location: [facility];  Service: Gynecology;  Laterality: N/A;  . EXAM UNDER ANESTHESIA WITH  MANIPULATION OF KNEE Left 12-20-2003  dr Noemi Chapel   post TKA  . FEMUR IM NAIL Left 12/11/2013   Procedure: INTRAMEDULLARY (IM) NAIL FEMORAL;  Surgeon: [name], MD;  Location: [facility];  Service: Orthopedics;  Laterality: Left;  . HYSTERECTOMY ABDOMINAL WITH SALPINGO-OOPHORECTOMY Bilateral 03/20/2018   Procedure: TOTAL HYSTERECTOMY ABDOMINAL WITH BILATERAL  SALPINGO-OOPHORECTOMY;  Surgeon: Isabel Caprice, MD;  [facility];  Service: Gynecology;  Laterality: Bilateral;  . IR FLUORO GUIDE PORT INSERTION RIGHT  07/22/2017  . IR PARACENTESIS  07/12/2017  . IR THORACENTESIS ASP PLEURAL SPACE W/IMG GUIDE  03/17/2018  . IR THORACENTESIS ASP PLEURAL  SPACE W/IMG GUIDE  05/08/2018  . IR US GUIDE VASC ACCESS RIGHT  07/22/2017  . KNEE ARTHROSCOPY W/ LATERAL RELEASE Left 09-03-2005   dr Noemi Chapel  [facility]   w/  Lysis Adhesions,  excision loose body's  . LAPAROSCOPIC CHOLECYSTECTOMY  12-04-2010  dr zeigler  . LAPAROTOMY N/A 03/20/2018   Procedure: EXPLORATORY LAPAROTOMY;  Surgeon: Isabel Caprice, MD;  [facility];  Service: Gynecology;  Laterality: N/A;  . OMENTECTOMY N/A 03/20/2018   Procedure: OMENTECTOMY;  Surgeon: Isabel Caprice, MD;  [facility];  Service: Gynecology;  Laterality: N/A;  . OTHER SURGICAL HISTORY  06/24/2018   Plurex Catheter inserted into the lungs  . TOTAL KNEE ARTHROPLASTY Left 09-08-2003   dr Noemi Chapel  [facility]  . TOTAL KNEE REVISION  08/06/2012   Procedure: TOTAL KNEE REVISION;  Surgeon: [name], MD;  [facility];  Service: Orthopedics;  Laterality: Left;  Left Total Knee Arthroplasty Revision  . TRANSTHORACIC ECHOCARDIOGRAM  08/20/2017   ef 60-65%,  grade 1 diastolic dysfunction/  trivial AR and TR  . Uterine polypectomy      Current Medications: Current Meds  Medication Sig  . albuterol (PROAIR HFA) 108 (90 Base) MCG/ACT inhaler Inhale 2 puffs into the lungs every 6 (six) hours as needed for wheezing or shortness of breath.  . ALPRAZolam (XANAX) 0.25 MG tablet Take 1 tablet (0.25 mg total) by mouth at bedtime as needed for anxiety.  Marland Kitchen azelastine (ASTELIN) 0.1 % nasal spray Place 2 sprays into both nostrils 2 (two) times daily as needed for allergies.   . benzonatate (TESSALON) 100 MG capsule Take 1 capsule (100 mg total) by mouth every 6 (six) hours as needed for cough.  . Cholecalciferol (VITAMIN D) 2000 units CAPS Take 2,000 Units by mouth daily.   Marland Kitchen dicyclomine (BENTYL) 10 MG capsule Take 1 capsule (10 mg total) by mouth 2 (two) times daily.  Marland Kitchen diltiazem (CARDIZEM CD) 180 MG 24 hr capsule TAKE ONE CAPSULE BY MOUTH EVERY MORNING (Patient taking differently: Take 180 mg by mouth daily. )  . diltiazem  (CARDIZEM CD) 180 MG 24 hr capsule Take 1 capsule (180 mg total) by mouth at bedtime.  . diphenhydrAMINE (BENADRYL) 25 mg capsule Take 25 mg by mouth every 8 (eight) hours as needed for itching.   . diphenoxylate-atropine (LOMOTIL) 2.5-0.025 MG tablet Take 1 tablet by mouth 4 (four) times daily. (Patient taking differently: Take 1 tablet by mouth 4 (four) times daily as needed for diarrhea or loose stools. )  . DULoxetine (CYMBALTA) 60 MG capsule TAKE 1 CAPSULE(60 MG) BY MOUTH DAILY (Patient taking differently: Take 60 mg by mouth daily. )  . fluticasone (FLONASE) 50 MCG/ACT nasal spray Place 2 sprays into both nostrils 2 (two) times daily as needed (FOR NASAL CONGESTION.).   Marland Kitchen furosemide (LASIX) 40 MG tablet Take 1  tablet (40 mg total) by mouth daily.  . halobetasol (ULTRAVATE) 0.05 % cream Apply 1 application topically 2 (two) times daily as needed (psoriasis).   Marland Kitchen levothyroxine (SYNTHROID, LEVOTHROID) 137 MCG tablet Take 137 mcg by mouth daily before breakfast. For thyroid therapy  . Magnesium 400 MG CAPS Take 400 mg by mouth 2 (two) times a day.   . mirtazapine (REMERON) 15 MG tablet Take 1 tablet (15 mg total) by mouth at bedtime.  Marland Kitchen oxyCODONE (ROXICODONE) 15 MG immediate release tablet Take 1 tablet (15 mg total) by mouth every 4 (four) hours as needed for pain.  . polyvinyl alcohol (LIQUIFILM TEARS) 1.4 % ophthalmic solution Place 1 drop into both eyes 2 (two) times daily as needed for dry eyes.   . potassium chloride SA (K-DUR,KLOR-CON) 20 MEQ tablet TAKE 1 TABLET BY MOUTH DAILY AND TAKE 1 ADDITIONAL TABLET WHEN TAKING EXTRA LASIX DOSE (Patient taking differently: Take 20 mEq by mouth daily. )  . STIOLTO RESPIMAT 2.5-2.5 MCG/ACT AERS INHALE 2 PUFFS INTO THE LUNGS DAILY (Patient taking differently: Inhale 2 puffs into the lungs daily. )  . XARELTO 20 MG TABS tablet TAKE 1 TABLET BY MOUTH EVERY DAY AFTER SUPPER     Allergies:   Codeine   Social History   Socioeconomic History  . Marital  status: Divorced    Spouse name: Not on file  . Number of children: 1  . Years of education: 32  . Highest education level: GED or equivalent  Occupational History  . Occupation: retired    Fish farm manager: RETIRED  Social Needs  . Financial resource strain: Not hard at all  . Food insecurity    Worry: Never true    Inability: Never true  . Transportation needs    Medical: No    Non-medical: No  Tobacco Use  . Smoking status: Former Smoker    Packs/day: 1.00    Years: 20.00    Pack years: 20.00    Types: Cigarettes    Quit date: 10/08/1980    Years since quitting: 38.5  . Smokeless tobacco: Never Used  Substance and Sexual Activity  . Alcohol use: Not Currently    Comment: rarely, 12-23-15 rarely  . Drug use: No  . Sexual activity: Not Currently  Lifestyle  . Physical activity    Days per week: 0 days    Minutes per session: 0 min  . Stress: Not at all  Relationships  . Social connections    Talks on phone: More than three times a week    Gets together: Three times a week    Attends religious service: More than 4 times per year    Active member of club or organization: No    Attends meetings of clubs or organizations: Never    Relationship status: Divorced  Other Topics Concern  . Not on file  Social History Narrative   Patient lives at home by herself.    Patient is retired.    Patient has 12 th grade education.            Family History:  The patient's   family history includes Colon cancer in her maternal grandfather; Colon cancer (age of onset: 70) in her mother; Depression in her sister; Heart disease in her father; Leukemia in her maternal aunt; Lung cancer in her father.   ROS:   Please see the history of present illness.    Review of Systems  Constitution: Positive for malaise/fatigue.  Cardiovascular: Positive for irregular  heartbeat and palpitations.   All other systems reviewed and are negative.   PHYSICAL EXAM:   VS:  BP 113/62   Pulse 84   Temp  98.1 F (36.7 C)   Ht 5' 5"  (1.651 m)   Wt 175 lb 14.4 oz (79.8 kg)   BMI 29.27 kg/m   Physical Exam  GEN: Well nourished, well developed, in no acute distress  On oxygen Neck: no JVD, carotid bruits, or masses Cardiac:RRR; no murmurs, rubs, or gallops  Respiratory:  clear to auscultation bilaterally, normal work of breathing GI: soft, nontender, nondistended, + BS Ext: without cyanosis, clubbing, or edema, Good distal pulses bilaterally Neuro:  Alert and Oriented x 3 Psych: euthymic mood, full affect  Wt Readings from Last 3 Encounters:  04/20/19 175 lb 14.4 oz (79.8 kg)  04/17/19 170 lb (77.1 kg)  03/05/19 189 lb 9.6 oz (86 kg)      Studies/Labs Reviewed:   EKG:  EKG is not ordered today.  The ekg reviewed from 04/17/2019 atrial fibrillation 151 bpm Recent Labs: 11/24/2018: Magnesium 1.7 04/13/2019: ALT 12 04/17/2019: B Natriuretic Peptide 115.0; BUN 21; Creatinine, Ser 1.03; Hemoglobin 9.0; Platelets 281; Potassium 3.7; Sodium 136   Lipid Panel    Component Value Date/Time   CHOL 147 11/14/2017 1413    Additional studies/ records that were reviewed today include:   Echocardiogram 08/20/2017: Study Conclusions   - Left ventricle: The cavity size was normal. Wall thickness was   normal. Systolic function was normal. The estimated ejection   fraction was in the range of 60% to 65%. Wall motion was normal;   there were no regional wall motion abnormalities. Doppler   parameters are consistent with abnormal left ventricular   relaxation (grade 1 diastolic dysfunction). - Aortic valve: There was no stenosis. There was trivial   regurgitation. - Mitral valve: Mildly calcified annulus. There was no significant   regurgitation. - Right ventricle: The cavity size was normal. Systolic function   was normal. - Tricuspid valve: Peak RV-RA gradient (S): 27 mm Hg. - Pulmonary arteries: PA peak pressure: 30 mm Hg (S). - Inferior vena cava: The vessel was normal in size. The    respirophasic diameter changes were in the normal range (= 50%),   consistent with normal central venous pressure.   Impressions:   - Normal LV size with EF 60-65%. Normal RV size and systolic   function. No significant valvular abnormalities.      ASSESSMENT:    1. Paroxysmal atrial fibrillation (HCC)   2. Chronic diastolic CHF (congestive heart failure) (HCC)      PLAN:  In order of problems listed above:  Paroxysmal atrial fibrillation chadvasc equals 3 on Xarelto-having trouble with recurrent atrial fibrillation with RVR.  Diltiazem increased to 180 mg twice daily 04/17/2019 but continues to have some breakthrough.  Worse after chemo.  We will add low-dose metoprolol tartrate 25 mg to take as needed.  I have asked her to call us if she has any wheezing or difficulty with metoprolol.  If she continues to have breakthrough may need to consider referral to the A. fib clinic.  Follow-up with myself or Dr. Domenic Polite in 1 month  Chronic diastolic CHF compensated  Ovarian cancer diagnosed 2 years ago and back on chemo for recurrence  COPD on home O2  Medication Adjustments/Labs and Tests Ordered: Current medicines are reviewed at length with the patient today.  Concerns regarding medicines are outlined above.  Medication changes, Labs and Tests ordered  today are listed in the Patient Instructions below. Patient Instructions  Medication Instructions:  Your physician has recommended you make the following change in your medication:  Lopressor 25 mg 2-3 Times daily as needed for palpitations. Call for Shortness of breath or weakness.   If you need a refill on your cardiac medications before your next appointment, please call your pharmacy.   Lab work: NONE   If you have labs (blood work) drawn today and your tests are completely normal, you will receive your results only by: Marland Kitchen MyChart Message (if you have MyChart) OR . A paper copy in the mail If you have any lab test that is  abnormal or we need to change your treatment, we will call you to review the results.  Testing/Procedures: NONE   Follow-Up: At [facility], you and your health needs are our priority.  As part of our continuing mission to provide you with exceptional heart care, we have created designated Provider Care Teams.  These Care Teams include your primary Cardiologist (physician) and Advanced Practice Providers (APPs -  Physician Assistants and Nurse Practitioners) who all work together to provide you with the care you need, when you need it. You will need a follow up appointment in 1 months.  Please call our office 2 months in advance to schedule this appointment.  You may see Rozann Lesches, MD or one of the following Advanced Practice Providers on your designated Care Team:   Bernerd Pho, PA-C [facility]) . Ermalinda Barrios, PA-C ([place])  Any Other Special Instructions Will Be Listed Below (If Applicable). Thank you for choosing [facility]!        Signed, Ermalinda Barrios, PA-C  04/20/2019 2:40 PM    [facility] [address], [place], [place]  [address] Phone: [phone]; Fax: [phone]

## 2019-04-26 ENCOUNTER — Emergency Department (HOSPITAL_COMMUNITY): Payer: Medicare Other

## 2019-04-26 ENCOUNTER — Encounter (HOSPITAL_COMMUNITY): Payer: Self-pay | Admitting: Emergency Medicine

## 2019-04-26 ENCOUNTER — Observation Stay (HOSPITAL_COMMUNITY)
Admission: EM | Admit: 2019-04-26 | Discharge: 2019-04-27 | Disposition: A | Discharge status: Still In Hospital | Payer: Medicare Other | Attending: Internal Medicine | Admitting: Internal Medicine

## 2019-04-26 ENCOUNTER — Other Ambulatory Visit: Payer: Self-pay

## 2019-04-26 DIAGNOSIS — I739 Peripheral vascular disease, unspecified: Secondary | ICD-10-CM | POA: Diagnosis not present

## 2019-04-26 DIAGNOSIS — F419 Anxiety disorder, unspecified: Secondary | ICD-10-CM | POA: Diagnosis not present

## 2019-04-26 DIAGNOSIS — D6481 Anemia due to antineoplastic chemotherapy: Secondary | ICD-10-CM | POA: Diagnosis not present

## 2019-04-26 DIAGNOSIS — K589 Irritable bowel syndrome without diarrhea: Secondary | ICD-10-CM | POA: Diagnosis not present

## 2019-04-26 DIAGNOSIS — Z79890 Hormone replacement therapy (postmenopausal): Secondary | ICD-10-CM | POA: Diagnosis not present

## 2019-04-26 DIAGNOSIS — K219 Gastro-esophageal reflux disease without esophagitis: Secondary | ICD-10-CM | POA: Insufficient documentation

## 2019-04-26 DIAGNOSIS — C562 Malignant neoplasm of left ovary: Secondary | ICD-10-CM | POA: Diagnosis not present

## 2019-04-26 DIAGNOSIS — Z87891 Personal history of nicotine dependence: Secondary | ICD-10-CM | POA: Insufficient documentation

## 2019-04-26 DIAGNOSIS — Z7951 Long term (current) use of inhaled steroids: Secondary | ICD-10-CM | POA: Diagnosis not present

## 2019-04-26 DIAGNOSIS — Z1159 Encounter for screening for other viral diseases: Secondary | ICD-10-CM | POA: Diagnosis not present

## 2019-04-26 DIAGNOSIS — J439 Emphysema, unspecified: Secondary | ICD-10-CM | POA: Insufficient documentation

## 2019-04-26 DIAGNOSIS — R188 Other ascites: Secondary | ICD-10-CM | POA: Insufficient documentation

## 2019-04-26 DIAGNOSIS — I480 Paroxysmal atrial fibrillation: Secondary | ICD-10-CM | POA: Insufficient documentation

## 2019-04-26 DIAGNOSIS — K922 Gastrointestinal hemorrhage, unspecified: Secondary | ICD-10-CM | POA: Diagnosis present

## 2019-04-26 DIAGNOSIS — J479 Bronchiectasis, uncomplicated: Secondary | ICD-10-CM | POA: Insufficient documentation

## 2019-04-26 DIAGNOSIS — N183 Chronic kidney disease, stage 3 (moderate): Secondary | ICD-10-CM | POA: Diagnosis not present

## 2019-04-26 DIAGNOSIS — M797 Fibromyalgia: Secondary | ICD-10-CM | POA: Insufficient documentation

## 2019-04-26 DIAGNOSIS — L4050 Arthropathic psoriasis, unspecified: Secondary | ICD-10-CM | POA: Insufficient documentation

## 2019-04-26 DIAGNOSIS — I700 Atherosclerosis of aorta: Secondary | ICD-10-CM | POA: Insufficient documentation

## 2019-04-26 DIAGNOSIS — E039 Hypothyroidism, unspecified: Secondary | ICD-10-CM | POA: Insufficient documentation

## 2019-04-26 DIAGNOSIS — I5032 Chronic diastolic (congestive) heart failure: Secondary | ICD-10-CM | POA: Insufficient documentation

## 2019-04-26 DIAGNOSIS — J90 Pleural effusion, not elsewhere classified: Secondary | ICD-10-CM | POA: Insufficient documentation

## 2019-04-26 DIAGNOSIS — Z79899 Other long term (current) drug therapy: Secondary | ICD-10-CM | POA: Insufficient documentation

## 2019-04-26 DIAGNOSIS — T451X5A Adverse effect of antineoplastic and immunosuppressive drugs, initial encounter: Secondary | ICD-10-CM | POA: Diagnosis not present

## 2019-04-26 DIAGNOSIS — D649 Anemia, unspecified: Secondary | ICD-10-CM | POA: Diagnosis present

## 2019-04-26 DIAGNOSIS — R911 Solitary pulmonary nodule: Secondary | ICD-10-CM | POA: Insufficient documentation

## 2019-04-26 DIAGNOSIS — R0789 Other chest pain: Secondary | ICD-10-CM

## 2019-04-26 DIAGNOSIS — Z7901 Long term (current) use of anticoagulants: Secondary | ICD-10-CM | POA: Diagnosis not present

## 2019-04-26 DIAGNOSIS — Z9221 Personal history of antineoplastic chemotherapy: Secondary | ICD-10-CM | POA: Diagnosis not present

## 2019-04-26 DIAGNOSIS — K5730 Diverticulosis of large intestine without perforation or abscess without bleeding: Secondary | ICD-10-CM | POA: Diagnosis not present

## 2019-04-26 DIAGNOSIS — F329 Major depressive disorder, single episode, unspecified: Secondary | ICD-10-CM | POA: Insufficient documentation

## 2019-04-26 DIAGNOSIS — J9811 Atelectasis: Secondary | ICD-10-CM | POA: Insufficient documentation

## 2019-04-26 LAB — URINALYSIS, ROUTINE W REFLEX MICROSCOPIC
Bilirubin Urine: NEGATIVE
Glucose, UA: NEGATIVE mg/dL
Ketones, ur: NEGATIVE mg/dL
Nitrite: NEGATIVE
Protein, ur: NEGATIVE mg/dL
Specific Gravity, Urine: 1.009 (ref 1.005–1.030)
WBC, UA: >50 WBC/hpf — ABNORMAL HIGH (ref 0–5)
pH: 6 (ref 5.0–8.0)

## 2019-04-26 LAB — CBC WITH DIFFERENTIAL/PLATELET
Abs Immature Granulocytes: 0.12 10*3/uL — ABNORMAL HIGH (ref 0.00–0.07)
Basophils Absolute: 0 10*3/uL (ref 0.0–0.1)
Basophils Relative: 0 %
Eosinophils Absolute: 0.2 10*3/uL (ref 0.0–0.5)
Eosinophils Relative: 3 %
HCT: 22.7 % — ABNORMAL LOW (ref 36.0–46.0)
Hemoglobin: 6.6 g/dL — CL (ref 12.0–15.0)
Immature Granulocytes: 2 %
Lymphocytes Relative: 14 %
Lymphs Abs: 1 10*3/uL (ref 0.7–4.0)
MCH: 23.4 pg — ABNORMAL LOW (ref 26.0–34.0)
MCHC: 29.1 g/dL — ABNORMAL LOW (ref 30.0–36.0)
MCV: 80.5 fL (ref 80.0–100.0)
Monocytes Absolute: 1 10*3/uL (ref 0.1–1.0)
Monocytes Relative: 14 %
Neutro Abs: 4.8 10*3/uL (ref 1.7–7.7)
Neutrophils Relative %: 67 %
Platelets: 224 10*3/uL (ref 150–400)
RBC: 2.82 MIL/uL — ABNORMAL LOW (ref 3.87–5.11)
RDW: 18.1 % — ABNORMAL HIGH (ref 11.5–15.5)
WBC: 7.2 10*3/uL (ref 4.0–10.5)
nRBC: 0 % (ref 0.0–0.2)

## 2019-04-26 LAB — LACTIC ACID, PLASMA: Lactic Acid, Venous: 1.5 mmol/L (ref 0.5–1.9)

## 2019-04-26 LAB — BASIC METABOLIC PANEL
Anion gap: 10 (ref 5–15)
BUN: 21 mg/dL (ref 8–23)
CO2: 28 mmol/L (ref 22–32)
Calcium: 8.4 mg/dL — ABNORMAL LOW (ref 8.9–10.3)
Chloride: 95 mmol/L — ABNORMAL LOW (ref 98–111)
Creatinine, Ser: 1.35 mg/dL — ABNORMAL HIGH (ref 0.44–1.00)
GFR calc Af Amer: 42 mL/min — ABNORMAL LOW (ref >60)
GFR calc non Af Amer: 36 mL/min — ABNORMAL LOW (ref >60)
Glucose, Bld: 127 mg/dL — ABNORMAL HIGH (ref 70–99)
Potassium: 3.4 mmol/L — ABNORMAL LOW (ref 3.5–5.1)
Sodium: 133 mmol/L — ABNORMAL LOW (ref 135–145)

## 2019-04-26 LAB — PREPARE RBC (CROSSMATCH)

## 2019-04-26 LAB — SARS CORONAVIRUS 2 BY RT PCR (HOSPITAL ORDER, PERFORMED IN ~~LOC~~ HOSPITAL LAB): SARS Coronavirus 2: NEGATIVE

## 2019-04-26 LAB — TROPONIN I (HIGH SENSITIVITY)
Troponin I (High Sensitivity): 6 ng/L (ref <18)
Troponin I (High Sensitivity): 6 ng/L (ref <18)

## 2019-04-26 LAB — HEMOGLOBIN AND HEMATOCRIT, BLOOD
HCT: 26.8 % — ABNORMAL LOW (ref 36.0–46.0)
Hemoglobin: 8.2 g/dL — ABNORMAL LOW (ref 12.0–15.0)

## 2019-04-26 LAB — POC OCCULT BLOOD, ED: Fecal Occult Bld: POSITIVE — AB

## 2019-04-26 MED ORDER — ONDANSETRON HCL 4 MG/2ML IJ SOLN: 4.0000 mg | Freq: Four times a day (QID) | INTRAMUSCULAR | Status: DC | PRN

## 2019-04-26 MED ORDER — DICYCLOMINE HCL 10 MG PO CAPS
10.0000 mg | ORAL_CAPSULE | Freq: Two times a day (BID) | ORAL | Status: DC
  Administered 2019-04-26 – 2019-04-27 (×2): 10 mg via ORAL
  Filled 2019-04-26 (×2): qty 1

## 2019-04-26 MED ORDER — DILTIAZEM HCL ER COATED BEADS 180 MG PO CP24: 180.0000 mg | ORAL_CAPSULE | Freq: Every day | ORAL | Status: DC

## 2019-04-26 MED ORDER — DULOXETINE HCL 60 MG PO CPEP
60.0000 mg | ORAL_CAPSULE | Freq: Every day | ORAL | Status: DC
  Administered 2019-04-26: 60 mg via ORAL
  Filled 2019-04-26: qty 1

## 2019-04-26 MED ORDER — POTASSIUM CHLORIDE CRYS ER 20 MEQ PO TBCR
20.0000 meq | EXTENDED_RELEASE_TABLET | Freq: Every day | ORAL | Status: DC
  Administered 2019-04-27: 20 meq via ORAL
  Filled 2019-04-26: qty 1

## 2019-04-26 MED ORDER — MIRTAZAPINE 15 MG PO TABS
15.0000 mg | ORAL_TABLET | Freq: Every day | ORAL | Status: DC
  Administered 2019-04-26: 15 mg via ORAL
  Filled 2019-04-26: qty 1

## 2019-04-26 MED ORDER — FUROSEMIDE 40 MG PO TABS
40.0000 mg | ORAL_TABLET | Freq: Every day | ORAL | Status: DC
  Administered 2019-04-27: 10:00:00 40 mg via ORAL
  Filled 2019-04-26: qty 1

## 2019-04-26 MED ORDER — LEVOTHYROXINE SODIUM 137 MCG PO TABS
137.0000 ug | ORAL_TABLET | Freq: Every day | ORAL | Status: DC
  Administered 2019-04-27: 137 ug via ORAL
  Filled 2019-04-26: qty 1

## 2019-04-26 MED ORDER — ACETAMINOPHEN 325 MG PO TABS: 650.0000 mg | ORAL_TABLET | Freq: Four times a day (QID) | ORAL | Status: DC | PRN

## 2019-04-26 MED ORDER — FUROSEMIDE 10 MG/ML IJ SOLN
20.0000 mg | Freq: Once | INTRAMUSCULAR | Status: AC
  Administered 2019-04-26: 20 mg via INTRAVENOUS
  Filled 2019-04-26: qty 2

## 2019-04-26 MED ORDER — SODIUM CHLORIDE 0.9 % IV SOLN
10.0000 mL/h | Freq: Once | INTRAVENOUS | Status: AC
  Administered 2019-04-26: 10 mL/h via INTRAVENOUS

## 2019-04-26 MED ORDER — POLYETHYLENE GLYCOL 3350 17 G PO PACK: 17.0000 g | PACK | Freq: Every day | ORAL | Status: DC | PRN

## 2019-04-26 MED ORDER — ALPRAZOLAM 0.25 MG PO TABS: 0.2500 mg | ORAL_TABLET | Freq: Every evening | ORAL | Status: DC | PRN

## 2019-04-26 MED ORDER — IPRATROPIUM-ALBUTEROL 0.5-2.5 (3) MG/3ML IN SOLN: 3.0000 mL | RESPIRATORY_TRACT | Status: DC | PRN

## 2019-04-26 MED ORDER — POTASSIUM CHLORIDE CRYS ER 20 MEQ PO TBCR
40.0000 meq | EXTENDED_RELEASE_TABLET | Freq: Once | ORAL | Status: AC
  Administered 2019-04-26: 40 meq via ORAL
  Filled 2019-04-26: qty 2

## 2019-04-26 MED ORDER — ACETAMINOPHEN 650 MG RE SUPP: 650.0000 mg | Freq: Four times a day (QID) | RECTAL | Status: DC | PRN

## 2019-04-26 MED ORDER — FLUTICASONE PROPIONATE 50 MCG/ACT NA SUSP: 2.0000 | Freq: Two times a day (BID) | NASAL | Status: DC | PRN

## 2019-04-26 MED ORDER — PANTOPRAZOLE SODIUM 40 MG IV SOLR
40.0000 mg | INTRAVENOUS | Status: DC
  Administered 2019-04-26: 40 mg via INTRAVENOUS
  Filled 2019-04-26: qty 40

## 2019-04-26 MED ORDER — ONDANSETRON HCL 4 MG PO TABS: 4.0000 mg | ORAL_TABLET | Freq: Four times a day (QID) | ORAL | Status: DC | PRN

## 2019-04-26 MED ORDER — MOMETASONE FURO-FORMOTEROL FUM 100-5 MCG/ACT IN AERO
2.0000 | INHALATION_SPRAY | Freq: Two times a day (BID) | RESPIRATORY_TRACT | Status: DC
  Administered 2019-04-26 – 2019-04-27 (×2): 2 via RESPIRATORY_TRACT
  Filled 2019-04-26 (×2): qty 8.8

## 2019-04-26 MED ORDER — SODIUM CHLORIDE 0.9% IV SOLUTION: Freq: Once | INTRAVENOUS | Status: DC

## 2019-04-26 MED ORDER — OXYCODONE HCL 5 MG PO TABS: 15.0000 mg | ORAL_TABLET | ORAL | Status: DC | PRN

## 2019-04-26 NOTE — ED Notes (Signed)
Patient has been having AFIB since she's been in the ED. 15 min post blood vitals reflect this trend. Pt states she does not have any symptoms of a transfusion reaction.

## 2019-04-26 NOTE — ED Triage Notes (Signed)
Denies pain but says she can tell her heart is not feeling right.  History of A-Fib.  Current HR 62-66.  On O2@2  L/M at home.

## 2019-04-26 NOTE — ED Notes (Signed)
Post blood 15 min vitals verified as acceptable via telephone to Denton Brick, MD.

## 2019-04-26 NOTE — ED Notes (Signed)
Date and time results received: 04/26/19 1525  Test: Hgb  Critical Value: 6.6  Name of Provider Notified:[place], [place]  Orders Received? Or Actions Taken?: See chart

## 2019-04-26 NOTE — H&P (Signed)
History and Physical    [name]:[id number] DOB: 02/02/1933 DOA: 04/26/2019  PCP: Asencion Noble, MD   Patient coming from: Home  I have personally briefly reviewed patient's old medical records in [place]  Chief Complaint: Palpitations  HPI: [name] is a 85 y.o. female with medical history significant for COPD, atrial fibrillation, diastolic CHF, hypothyroidism, psoriatic arthritis, left ovarian cancer, peripheral artery disease, who presented to the ED with complaints of " her heart not feeling right".  She reports her heart rate would drop down to 30s of 40s feeling at home.  She denies abdominal pain.  No vomiting.  No bloody stools or black stools.  She is compliant with her Xarelto, her last dose was last night. Patient reports chronic difficulty breathing from COPD that is unchanged, no chest pain, no fevers or chills.  ED Course: Blood pressure systolic 956 to 213.  Heart rate 50s to 80s.  Hemoglobin 6.6.  Baseline about 9.  Creatinine 1.35, about baseline.  Positive stool FOBT.  Two-view chest x-ray-cannot exclude a tiny right apical pneumothorax, potential new pulmonary nodules left lung base.  Negative high-sensitivity troponin x2.  CT chest recommended for further characterization.  Small bilateral pleural effusions.  Subsequent CT chest without contrast-no evidence of right pneumothorax or left apical nodules as questioned earlier.  Moderate to large right pleural effusion, small left pleural effusion, with associated passive atelectasis, fevers over pneumonia in lower lobes, moderate to marked central bronchial wall thickening indicating asthma bronchitis, moderate bilateral lower lobe and mild bilateral upper lobe bronchiectasis. 1 unit of PRBC ordered for transfusion in ED.  Hospitalist to admit for symptomatic anemia.  Review of Systems: As per HPI all other systems reviewed and negative.  Past Medical History:  Diagnosis Date   Anxiety    Chronic  blood loss anemia    03-04-2018 diverticular bleed and rectal bleeding,  transfused 2 units PRBCs 03-08-2018   Colitis    COPD (chronic obstructive pulmonary disease) (HCC)    Dr. Lamonte Sakai   Depression    Diastolic CHF, chronic ([place])    Diverticulosis    Family history of colon cancer    Fibromyalgia    Genetic testing 04/07/2018   MyRisk (35 genes) @ [facility] - No pathogenic mutations detected   GERD (gastroesophageal reflux disease)    Hemorrhoids    Hiatal hernia    Hip pain 07/2018   Right Hip Pain   History of rectal polyps    History of shingles    Hypothyroidism    IBS (irritable bowel syndrome)    Lymphocytic colitis    Dr. Henrene Pastor   Malignant ascites    Admission 06/2017 abdominal s/p parencentesis 07-01-2017 2.5L, 07-08-2016  2.7L, 07-12-2017  1451m   Neuropathy due to chemotherapeutic drug ([facility]    On home O2    Osteoporosis    [place]    Chemotherapy - Dr. GAlvy Bimler  Paroxysmal atrial fibrillation ([facility]    Xarelto stopped 03-07-2018 due to lower GI bleed   Pleural effusion    s/p  right thoracentesis, 02-2018 1.3L and 03-17-2018 right thoracentesis 6471m, post cxr no residual effusion   [place]atic [place] ([facility]   Schatzki's ring 2013   Seasonal allergic rhinitis     Past Surgical History:  Procedure Laterality Date   CARDIOVASCULAR STRESS TEST  09/23/2012   Low risk lexiscan nuclear study w/ apical thinning but no evidence of ischemia/  normal LV function and wall motion ,  ef 75%   CATARACT EXTRACTION W/ INTRAOCULAR LENS  IMPLANT, BILATERAL  10/2016   CHEST TUBE INSERTION Right 06/24/2018   Procedure: INSERTION PLEURAL DRAINAGE CATHETER;  Surgeon: Grace Isaac, MD;  Location: [facility];  Service: Thoracic;  Laterality: Right;   COLONOSCOPY     DEBULKING N/A 03/20/2018   Procedure: DEBULKING;  Surgeon: Isabel Caprice, MD;  Location: [facility];  Service: Gynecology;  Laterality: N/A;   EXAM UNDER ANESTHESIA WITH  MANIPULATION OF KNEE Left 12-20-2003  dr Noemi Chapel   post TKA   FEMUR IM NAIL Left 12/11/2013   Procedure: INTRAMEDULLARY (IM) NAIL FEMORAL;  Surgeon: [name], MD;  Location: [facility];  Service: Orthopedics;  Laterality: Left;   HYSTERECTOMY ABDOMINAL WITH SALPINGO-OOPHORECTOMY Bilateral 03/20/2018   Procedure: TOTAL HYSTERECTOMY ABDOMINAL WITH BILATERAL  SALPINGO-OOPHORECTOMY;  Surgeon: Isabel Caprice, MD;  Location: [facility];  Service: Gynecology;  Laterality: Bilateral;   IR FLUORO GUIDE PORT INSERTION RIGHT  07/22/2017   IR PARACENTESIS  07/12/2017   IR THORACENTESIS ASP PLEURAL SPACE W/IMG GUIDE  03/17/2018   IR THORACENTESIS ASP PLEURAL SPACE W/IMG GUIDE  05/08/2018   IR US GUIDE VASC ACCESS RIGHT  07/22/2017   KNEE ARTHROSCOPY W/ LATERAL RELEASE Left 09-03-2005   dr Noemi Chapel  [facility]   w/  Lysis Adhesions,  excision loose body's   LAPAROSCOPIC CHOLECYSTECTOMY  12-04-2010  dr zeigler   LAPAROTOMY N/A 03/20/2018   Procedure: EXPLORATORY LAPAROTOMY;  Surgeon: Isabel Caprice, MD;  Location: [facility];  Service: Gynecology;  Laterality: N/A;   OMENTECTOMY N/A 03/20/2018   Procedure: OMENTECTOMY;  Surgeon: Isabel Caprice, MD;  Location: [facility];  Service: Gynecology;  Laterality: N/A;   OTHER SURGICAL HISTORY  06/24/2018   Plurex Catheter inserted into the lungs   TOTAL KNEE ARTHROPLASTY Left 09-08-2003   dr Noemi Chapel  [place]  08/06/2012   Procedure: TOTAL KNEE REVISION;  Surgeon: [name], MD;  Location[facility];  Service: Orthopedics;  Laterality: Left;  Left Total Knee Arthroplasty Revision   TRANSTHORACIC ECHOCARDIOGRAM  08/20/2017   ef 60-65%,  grade 1 diastolic dysfunction/  trivial AR and TR   Uterine polypectomy       reports that she quit smoking about 38 years ago. Her smoking use included cigarettes. She has a 20.00 pack-year smoking history. She has never used smokeless tobacco. She reports previous alcohol use. She reports that she does not use  drugs.  Allergies  Allergen Reactions   Codeine Itching    Family History  Problem Relation Age of Onset   Heart disease Father        pacemaker   Lung cancer Father        smoker; deceased 79   Colon cancer Mother 89       2nd rectal ca at 21   Colon cancer Maternal Grandfather        dx 78s; deceased 43s   Depression Sister    Leukemia Maternal Aunt        deceased 76     Prior to Admission medications   Medication Sig Start Date End Date Taking? Authorizing Provider  albuterol (PROAIR HFA) 108 (90 Base) MCG/ACT inhaler Inhale 2 puffs into the lungs every 6 (six) hours as needed for wheezing or shortness of breath. 04/01/19  Yes Byrum, Rose Fillers, MD  ALPRAZolam Duanne Moron) 0.25 MG tablet Take 1 tablet (0.25 mg total) by mouth at bedtime as needed for anxiety. 11/15/17  Yes Amin, Jeanella Flattery, MD  Cholecalciferol (VITAMIN D) 2000 units CAPS Take 2,000 Units by mouth daily.    Yes [provider]  dicyclomine (BENTYL) 10 MG capsule Take 1 capsule (10 mg total) by mouth 2 (two) times daily. 03/03/19  Yes [name], MD  diltiazem (CARDIZEM CD) 180 MG 24 hr capsule Take 1 capsule (180 mg total) by mouth at bedtime. 04/17/19  Yes Francine Graven, DO  diphenhydrAMINE (BENADRYL) 25 mg capsule Take 25 mg by mouth every 8 (eight) hours as needed for itching.    Yes [provider]  diphenoxylate-atropine (LOMOTIL) 2.5-0.025 MG tablet Take 1 tablet by mouth 4 (four) times daily. Patient taking differently: Take 1 tablet by mouth 4 (four) times daily as needed for diarrhea or loose stools.  08/21/16  Yes [name], MD  DULoxetine (CYMBALTA) 60 MG capsule TAKE 1 CAPSULE(60 MG) BY MOUTH DAILY Patient taking differently: Take 60 mg by mouth at bedtime.  01/27/19  Yes Gorsuch, Ni, MD  fluticasone (FLONASE) 50 MCG/ACT nasal spray Place 2 sprays into both nostrils 2 (two) times daily as needed (FOR NASAL CONGESTION.).  11/20/13  Yes [provider]  furosemide  (LASIX) 40 MG tablet Take 1 tablet (40 mg total) by mouth daily. 10/13/18  Yes Satira Sark, MD  halobetasol (ULTRAVATE) 0.05 % cream Apply 1 application topically 2 (two) times daily as needed (psoriasis).    Yes [provider]  levothyroxine (SYNTHROID, LEVOTHROID) 137 MCG tablet Take 137 mcg by mouth daily before breakfast. For thyroid therapy   Yes [provider]  Magnesium 400 MG CAPS Take 400 mg by mouth 2 (two) times a day.    Yes [provider]  metoprolol tartrate (LOPRESSOR) 25 MG tablet Take 1 tablet (25 mg total) by mouth 2 (two) times daily as needed. 04/20/19 07/19/19 Yes Imogene Burn, PA-C  mirtazapine (REMERON) 15 MG tablet Take 1 tablet (15 mg total) by mouth at bedtime. 04/08/19  Yes Gorsuch, Ni, MD  oxyCODONE (ROXICODONE) 15 MG immediate release tablet Take 1 tablet (15 mg total) by mouth every 4 (four) hours as needed for pain. 03/30/19  Yes Gorsuch, Ni, MD  polyvinyl alcohol (LIQUIFILM TEARS) 1.4 % ophthalmic solution Place 1 drop into both eyes 2 (two) times daily as needed for dry eyes.    Yes [provider]  potassium chloride SA (K-DUR,KLOR-CON) 20 MEQ tablet TAKE 1 TABLET BY MOUTH DAILY AND TAKE 1 ADDITIONAL TABLET WHEN TAKING EXTRA LASIX DOSE Patient taking differently: Take 20 mEq by mouth daily.  11/10/18  Yes Satira Sark, MD  STIOLTO RESPIMAT 2.5-2.5 MCG/ACT AERS INHALE 2 PUFFS INTO THE LUNGS DAILY Patient taking differently: Inhale 2 puffs into the lungs daily.  02/02/19  Yes Byrum, Rose Fillers, MD  XARELTO 20 MG TABS tablet TAKE 1 TABLET BY MOUTH EVERY DAY AFTER SUPPER Patient taking differently: Take 20 mg by mouth daily with supper.  10/06/18  Yes Satira Sark, MD  azelastine (ASTELIN) 0.1 % nasal spray Place 2 sprays into both nostrils 2 (two) times daily as needed for allergies.     [provider]  benzonatate (TESSALON) 100 MG capsule Take 1 capsule (100 mg total) by mouth every 6 (six) hours as needed  for cough. Patient not taking: Reported on 04/26/2019 03/19/19   Collene Gobble, MD  diltiazem (CARDIZEM CD) 180 MG 24 hr capsule TAKE ONE CAPSULE BY MOUTH EVERY MORNING Patient not taking: No sig reported 02/23/19  Satira Sark, MD  Calcium Carbonate (CALCIUM 600) 1500 MG TABS Take 2 tablets by mouth daily.    09/11/18  [provider]    Physical Exam: Vitals:   04/26/19 1812 04/26/19 1815 04/26/19 1827 04/26/19 1830  BP: 111/70  (!) 121/52 (!) 105/38  Pulse: 60 88 80 85  Resp: 16  16 (!) 27  Temp: 98.6 F (37 C)  98.8 F (37.1 C)   TempSrc: Oral  Oral   SpO2: 99% 97%  92%  Weight:      Height:        Constitutional: Appears pale, calm, comfortable Vitals:   04/26/19 1812 04/26/19 1815 04/26/19 1827 04/26/19 1830  BP: 111/70  (!) 121/52 (!) 105/38  Pulse: 60 88 80 85  Resp: 16  16 (!) 27  Temp: 98.6 F (37 C)  98.8 F (37.1 C)   TempSrc: Oral  Oral   SpO2: 99% 97%  92%  Weight:      Height:       Eyes: PERRL, lids and conjunctivae normal ENMT: Mucous membranes are moist. Posterior pharynx clear of any exudate or lesions. Respiratory: clear to auscultation bilaterally, no wheezing, no crackles. Normal respiratory effort. No accessory muscle use.  Cardiovascular: Regular rate and rhythm, no murmurs / rubs / gallops. No extremity edema. 2+ pedal pulses.  Abdomen: no tenderness, no masses palpated. No hepatosplenomegaly. Bowel sounds positive.  Musculoskeletal: no clubbing / cyanosis. No joint deformity upper and lower extremities. Good ROM, no contractures. Normal muscle tone.  Skin: no rashes, lesions, ulcers. No induration Neurologic: CN 2-12 grossly intact.  Strength 5/5 in all 4.  Psychiatric: Normal judgment and insight. Alert and oriented x 3. Normal mood.   Labs on Admission: I have personally reviewed following labs and imaging studies  CBC: Recent Labs  Lab 04/26/19 1455  WBC 7.2  NEUTROABS 4.8  HGB 6.6*  HCT 22.7*  MCV 80.5  PLT 229    Basic Metabolic Panel: Recent Labs  Lab 04/26/19 1455  NA 133*  K 3.4*  CL 95*  CO2 28  GLUCOSE 127*  BUN 21  CREATININE 1.35*  CALCIUM 8.4*   Urine analysis:    Component Value Date/Time   COLORURINE YELLOW 04/26/2019 1729   APPEARANCEUR HAZY (A) 04/26/2019 1729   LABSPEC 1.009 04/26/2019 1729   LABSPEC 1.030 10/11/2017 1125   PHURINE 6.0 04/26/2019 1729   GLUCOSEU NEGATIVE 04/26/2019 1729   GLUCOSEU Negative 10/11/2017 1125   HGBUR MODERATE (A) 04/26/2019 1729   BILIRUBINUR NEGATIVE 04/26/2019 1729   BILIRUBINUR Negative 10/11/2017 1125   KETONESUR NEGATIVE 04/26/2019 1729   PROTEINUR NEGATIVE 04/26/2019 1729   UROBILINOGEN 0.2 10/11/2017 1125   NITRITE NEGATIVE 04/26/2019 1729   LEUKOCYTESUR MODERATE (A) 04/26/2019 1729   LEUKOCYTESUR Negative 10/11/2017 1125    Radiological Exams on Admission: Dg Chest 2 View  Result Date: 04/26/2019 CLINICAL DATA:  Chest discomfort, hx of a-fib, low O2 sat at home- pt undergoing chemo for ovarian cancer EXAM: CHEST - 2 VIEW COMPARISON:  04/17/2019 FINDINGS: Power injectable right IJ Port-A-Cath tip: SVC. Atherosclerotic calcification of the aortic arch. Heart size within normal limits. Blunting of both costophrenic angles compatible with pleural effusions. Subtle subpleural lucency along the right lung apex; although this may be from a skin wrinkle, I cannot completely exclude a small apical pneumothorax. New nodularity in the left lung apex including a 1.3 cm pulmonary nodule projecting of the left first rib and perhaps another adjacent nodule. Thoracic spondylosis. Linear  subsegmental atelectasis at the left lung base. IMPRESSION: 1. I cannot exclude a tiny right apical pneumothorax. Also, there are potential new pulmonary nodules at the left lung apex. Chest CT is recommended for further characterization. 2. Small bilateral pleural effusions. 3.  Aortic Atherosclerosis (ICD10-I70.0). Electronically Signed   By: Van Clines M.D.    On: 04/26/2019 15:22   Ct Chest Wo Contrast  Result Date: 04/26/2019 CLINICAL DATA:  85 year old presenting with current history of ovarian cancer for which she is undergoing chemotherapy, current history of atrial fibrillation, presenting with chest discomfort. Abnormal chest x-ray earlier today questioning a RIGHT apically pneumothorax and LEFT apically lung nodules. EXAM: CT CHEST WITHOUT CONTRAST TECHNIQUE: Multidetector CT imaging of the chest was performed following the standard protocol without IV contrast. COMPARISON:  No prior chest CT. FINDINGS: Cardiovascular: Heart size upper normal to slightly enlarged. Severe three-vessel coronary atherosclerosis. RIGHT jugular Port-A-Cath tip at the cavoatrial junction. No pericardial effusion. Moderate to severe atherosclerosis involving the thoracic and proximal abdominal aorta without evidence of aneurysm. Mediastinum/Nodes: Numerous normal sized lymph nodes throughout the mediastinum. No pathologic lymphadenopathy. Fluid in the mid portion of the normal appearing esophagus without evidence of hiatal hernia. Mild thyromegaly without nodularity. Lungs/Pleura: No evidence of a RIGHT pneumothorax or LEFT apical nodules as questioned on the earlier chest x-ray. Emphysematous changes in both lungs. No parenchymal nodules or masses to suggest metastatic disease. Moderately large RIGHT pleural effusion and associated consolidation in the RIGHT LOWER LOBE. Small LEFT pleural effusion and associated mild consolidation in the LEFT LOWER LOBE. Moderate BILATERAL lower lobe and mild BILATERAL upper lobe bronchiectasis. Central airways patent with moderate to marked bronchial wall thickening. Upper Abdomen: Minimal perihepatic ascites. Visualized upper abdomen otherwise unremarkable for the unenhanced technique. Musculoskeletal: Osseous demineralization. Benign bone island in the T11 vertebral body. Degenerative changes and DISH involving the mid and LOWER thoracic spine.  No acute findings. IMPRESSION: 1. No evidence of a RIGHT pneumothorax or LEFT apical nodules as questioned on the earlier chest x-ray. 2. Moderately large RIGHT pleural effusion and small LEFT pleural effusion with associated passive atelectasis (favored over pneumonia) in the lower lobes. 3. Moderate to marked central bronchial wall thickening indicating asthma and/or bronchitis. 4. Moderate bilateral lower lobe and mild bilateral upper lobe bronchiectasis. 5. Minimal perihepatic ascites. Aortic Atherosclerosis (ICD10-I70.0) and Emphysema (ICD10-J43.9). Electronically Signed   By: Evangeline Dakin M.D.   On: 04/26/2019 16:19    EKG: Independently reviewed.  Sinus rhythm.  QTc 399.  No significant ST or T wave changes compared to prior.  Assessment/Plan Active Problems:   Symptomatic anemia    Symptomatic anemia- hemoglobin 6.6.  Stool FOBT positive.  Denies melena hematemesis or hematochezia.  On anticoagulation with Xarelto for atrial fibrillation.  Last dose last night.  Stable vitals.  Colonoscopy 2011, showed diverticulosis.  Last EGD 2013- Schatzki ring in distal esophagus, small hiatal hernia.  Otherwise unremarkable. 1U PRBC transfused in ED - Transfuse Second unit PRBC -IV Lasix 20 mg x 1 with transfusion - Monitor hemoglobin closely -IV Protonix 40 daily -GI consult  Mild electrolyte abnormalities-hyponatremia, 133, hypokalemia 3.4.  From diuretics.  Baseline sodium 134-136. -Replete K.  Atrial fibrillation-rate controlled and anticoagulated with Xarelto.  Rates 50s to 65s.  Patient reports briefly dropped to 30s to 40s at home.  Blood pressure systolic [id number].  Follows with Dr. Conni Elliot. With complaints of tachycardia heart rate up to 140s at home, metoprolol 25 mg was started as needed. -Continue home Cardizem 180 daily -  Hold home PRN metoprolol 60m  Ovarian cancer-currently on chemotherapy.  Follows with Dr. GAlvy Bimler -Continue home narcotics, been to  Psoriatic  arthritis-appears stable -Continue home narcotics  COPD-no wheezing, or change in respiratory symptoms.  Chest CT shows moderately large right pleural effusion, small left pleural effusion, associated passive atelectasis, bronchitis and bronchiectasis. -Continue home bronchodilators -PRN albuterol  Diastolic CHF- appears stable.  CT shows pleural effusion.  This appears chronic.  Seen on prior x-ray 03/30/19.  Last echo 08/2017, EF 60 to 65%, G1 DD. -Continue home Lasix  Depression-  -Continue home Cymbalta, mirtazapine, nightly PRN Xanax.   DVT prophylaxis: SCDS Code Status: Full Family Communication: Daughter at bedside Disposition Plan: Per rounding team Consults called: GAstroenterology Admission status: Obs, tele   EBethena RoysMD [facility]  04/26/2019, 8:23 PM

## 2019-04-26 NOTE — ED Notes (Signed)
Pt due chemotherapy tomorrow at [facility].

## 2019-04-26 NOTE — ED Notes (Signed)
Went to obtain blood from lab, they stated it was not ready.

## 2019-04-26 NOTE — ED Provider Notes (Signed)
[facility] EMERGENCY DEPARTMENT Provider Note   CSN: [id number] Arrival date & time: 04/26/19  1339     History   Chief Complaint Chief Complaint  Patient presents with  . Atrial Fibrillation    HPI [name] is a 85 y.o. female.     Patient with h/o ovarian cancer currently undergoing chemotherapy (13 days status post last treatment, due for treatment tomorrow), atrial fibrillation on cardizem, recent addition of metoprolol as needed for breakthrough afib -- presents with complaint of chest discomfort described as just not feeling right and like her heart was going slow.  Last took metoprolol yesterday.  Family was using a pulse ox and noted that her heart rate was mainly in the 60s but would drop down to the 30s and 40s briefly at times.  They also noted low oxygen saturation when she "was not feeling right".  Family at bedside stated that her hands were very pale.  Now she is feeling a bit better.  No lightheadedness, chest pain, or worsening shortness of breath (patient reports chronic shortness of breath at baseline).  She feels generally weak but no worse than typical.  No abdominal pain, nausea, vomiting, diarrhea.  The onset of this condition was acute. The course is constant. Aggravating factors: none. Alleviating factors: none.       Past Medical History:  Diagnosis Date  . Anxiety   . Chronic blood loss anemia    03-04-2018 diverticular bleed and rectal bleeding,  transfused 2 units PRBCs 03-08-2018  . Colitis   . COPD (chronic obstructive pulmonary disease) (HCC)    Dr. Lamonte Sakai  . Depression   . Diastolic CHF, chronic ([place])   . Diverticulosis   . Family history of colon cancer   . Fibromyalgia   . Genetic testing 04/07/2018   MyRisk (35 genes) @ [facility] - No pathogenic mutations detected  . GERD (gastroesophageal reflux disease)   . Hemorrhoids   . Hiatal hernia   . Hip pain 07/2018   Right Hip Pain  . History of rectal polyps   . History of shingles    . Hypothyroidism   . IBS (irritable bowel syndrome)   . Lymphocytic colitis    Dr. Henrene Pastor  . Malignant ascites    Admission 06/2017 abdominal s/p parencentesis 07-01-2017 2.5L, 07-08-2016  2.7L, 07-12-2017  1430m  . Neuropathy due to chemotherapeutic drug ([place]   . On home O2   . Osteoporosis   . Ovarian cancer ([facility]    Chemotherapy - Dr. GAlvy Bimler . Paroxysmal atrial fibrillation ([place]    Xarelto stopped 03-07-2018 due to lower GI bleed  . Pleural effusion    s/p  right thoracentesis, 02-2018 1.3L and 03-17-2018 right thoracentesis 6484m, post cxr no residual effusion  . Psoriatic arthritis ([place]  . Schatzki's ring 2013  . Seasonal allergic rhinitis     Patient Active Problem List   Diagnosis Date Noted  . UTI (urinary tract infection) 12/29/2018  . Mucositis due to chemotherapy 12/09/2018  . CKD (chronic kidney disease) stage 3, GFR 30-59 ml/min (HCC) 10/14/2018  . Chronic atrial fibrillation 10/14/2018  . Hypomagnesemia 09/22/2018  . Cancer associated pain 09/12/2018  . Goals of care, counseling/discussion 09/12/2018  . Other constipation 05/28/2018  . Psoriatic arthritis (HCC[place]iff07/16/2019  . Genetic testing 04/07/2018  . Family history of colon cancer   . History of GI diverticular bleed 03/12/2018  . Lower GI bleed   . Diverticulosis of colon with hemorrhage   .  Rectal bleeding 03/04/2018  . Acute GI bleeding 03/04/2018  . Thrombocytopenia ([place]) 01/03/2018  . Anemia, chronic disease 12/13/2017  . Mild protein-calorie malnutrition ([place]) 12/13/2017  . Peripheral neuropathy due to chemotherapy ([place]) 12/13/2017  . S/P thoracentesis   . Atrial fibrillation with RVR ([place]) 11/08/2017  . HCAP (healthcare-associated pneumonia) 11/08/2017  . Pleural effusion 09/20/2017  . Acute on chronic diastolic heart failure ([place]) 08/19/2017  . Pancytopenia, acquired ([place]) 08/15/2017  . Protein-calorie malnutrition, moderate ([place]) 07/30/2017  . Generalized weakness 07/30/2017  .  Antineoplastic chemotherapy induced pancytopenia ([place]) 07/20/2017  . Left ovarian epithelial cancer ([place]) 07/18/2017  . PNA (pneumonia) 07/07/2017  . Ascites 06/30/2017  . Ascites, malignant 06/30/2017  . Elevated CA-125 01/18/2017  . Depression 12/23/2015  . Peripheral edema 08/23/2015  . Fracture of hip, left, closed ([place]) 12/08/2013  . Hip fracture ([place]) 12/08/2013  . PAD (peripheral artery disease) ([place]) 11/25/2013  . Encounter for therapeutic drug monitoring 11/09/2013  . Cough 03/20/2013  . Total knee replacement status 10/07/2012  . Knee pain 10/07/2012  . Knee stiffness 10/07/2012  . Tachycardia 09/17/2012  . Chest pain 09/17/2012  . Difficulty in walking(719.7) 09/16/2012  . Muscle weakness (generalized) 09/16/2012  . Postop Acute blood loss anemia 08/08/2012  . Instability of prosthetic knee ([place]) 08/06/2012  . Bloating 02/14/2012  . Upper abdominal pain 02/14/2012  . Allergic rhinitis, seasonal 02/15/2011  . COLITIS 02/20/2010  . Diarrhea 01/02/2010  . ABDOMINAL PAIN -GENERALIZED 01/02/2010  . PERSONAL HX COLONIC POLYPS 01/02/2010  . Hypothyroidism 12/28/2009  . COPD (chronic obstructive pulmonary disease) ([place]) 12/28/2009  . Arthropathy 12/28/2009    Past Surgical History:  Procedure Laterality Date  . CARDIOVASCULAR STRESS TEST  09/23/2012   Low risk lexiscan nuclear study w/ apical thinning but no evidence of ischemia/  normal LV function and wall motion , ef 75%  . CATARACT EXTRACTION W/ INTRAOCULAR LENS  IMPLANT, BILATERAL  10/2016  . CHEST TUBE INSERTION Right 06/24/2018   Procedure: INSERTION PLEURAL DRAINAGE CATHETER;  Surgeon: Grace Isaac, MD;  Location: [place];  Service: Thoracic;  Laterality: Right;  . COLONOSCOPY    . DEBULKING N/A 03/20/2018   Procedure: DEBULKING;  Surgeon: Isabel Caprice, MD;  Location: [facility];  Service: Gynecology;  Laterality: N/A;  . EXAM UNDER ANESTHESIA WITH MANIPULATION OF KNEE Left 12-20-2003  dr Noemi Chapel   post TKA   . FEMUR IM NAIL Left 12/11/2013   Procedure: INTRAMEDULLARY (IM) NAIL FEMORAL;  Surgeon: [name], MD;  Location: [facility];  Service: Orthopedics;  Laterality: Left;  . HYSTERECTOMY ABDOMINAL WITH SALPINGO-OOPHORECTOMY Bilateral 03/20/2018   Procedure: TOTAL HYSTERECTOMY ABDOMINAL WITH BILATERAL  SALPINGO-OOPHORECTOMY;  Surgeon: Isabel Caprice, MD;  Location: [facility];  Service: Gynecology;  Laterality: Bilateral;  . IR FLUORO GUIDE PORT INSERTION RIGHT  07/22/2017  . IR PARACENTESIS  07/12/2017  . IR THORACENTESIS ASP PLEURAL SPACE W/IMG GUIDE  03/17/2018  . IR THORACENTESIS ASP PLEURAL SPACE W/IMG GUIDE  05/08/2018  . IR US GUIDE VASC ACCESS RIGHT  07/22/2017  . KNEE ARTHROSCOPY W/ LATERAL RELEASE Left 09-03-2005   dr Noemi Chapel  [facility]   w/  Lysis Adhesions,  excision loose body's  . LAPAROSCOPIC CHOLECYSTECTOMY  12-04-2010  dr zeigler  . LAPAROTOMY N/A 03/20/2018   Procedure: EXPLORATORY LAPAROTOMY;  Surgeon: Isabel Caprice, MD;  Location: [facility];  Service: Gynecology;  Laterality: N/A;  . OMENTECTOMY N/A 03/20/2018   Procedure: OMENTECTOMY;  Surgeon: Isabel Caprice, MD;  Location: [facility];  Service: Gynecology;  Laterality: N/A;  . OTHER SURGICAL HISTORY  06/24/2018   Plurex Catheter inserted into the lungs  . TOTAL KNEE ARTHROPLASTY Left 09-08-2003   dr Noemi Chapel  [facility]  . TOTAL KNEE REVISION  08/06/2012   Procedure: TOTAL KNEE REVISION;  Surgeon: [name], MD;  Location: [facility];  Service: Orthopedics;  Laterality: Left;  Left Total Knee Arthroplasty Revision  . TRANSTHORACIC ECHOCARDIOGRAM  08/20/2017   ef 60-65%,  grade 1 diastolic dysfunction/  trivial AR and TR  . Uterine polypectomy       OB History   No obstetric history on file.      Home Medications    Prior to Admission medications   Medication Sig Start Date End Date Taking? Authorizing Provider  albuterol (PROAIR HFA) 108 (90 Base) MCG/ACT inhaler Inhale 2 puffs into the lungs every 6 (six) hours as needed for  wheezing or shortness of breath. 04/01/19   Byrum, Rose Fillers, MD  ALPRAZolam Duanne Moron) 0.25 MG tablet Take 1 tablet (0.25 mg total) by mouth at bedtime as needed for anxiety. 11/15/17   Amin, Jeanella Flattery, MD  azelastine (ASTELIN) 0.1 % nasal spray Place 2 sprays into both nostrils 2 (two) times daily as needed for allergies.     [provider]  benzonatate (TESSALON) 100 MG capsule Take 1 capsule (100 mg total) by mouth every 6 (six) hours as needed for cough. 03/19/19   Collene Gobble, MD  Cholecalciferol (VITAMIN D) 2000 units CAPS Take 2,000 Units by mouth daily.     [provider]  dicyclomine (BENTYL) 10 MG capsule Take 1 capsule (10 mg total) by mouth 2 (two) times daily. 03/03/19   [name], MD  diltiazem (CARDIZEM CD) 180 MG 24 hr capsule TAKE ONE CAPSULE BY MOUTH EVERY MORNING Patient taking differently: Take 180 mg by mouth daily.  02/23/19   Satira Sark, MD  diltiazem (CARDIZEM CD) 180 MG 24 hr capsule Take 1 capsule (180 mg total) by mouth at bedtime. 04/17/19   Francine Graven, DO  diphenhydrAMINE (BENADRYL) 25 mg capsule Take 25 mg by mouth every 8 (eight) hours as needed for itching.     [provider]  diphenoxylate-atropine (LOMOTIL) 2.5-0.025 MG tablet Take 1 tablet by mouth 4 (four) times daily. Patient taking differently: Take 1 tablet by mouth 4 (four) times daily as needed for diarrhea or loose stools.  08/21/16   [name], MD  DULoxetine (CYMBALTA) 60 MG capsule TAKE 1 CAPSULE(60 MG) BY MOUTH DAILY Patient taking differently: Take 60 mg by mouth daily.  01/27/19   Heath Lark, MD  fluticasone (FLONASE) 50 MCG/ACT nasal spray Place 2 sprays into both nostrils 2 (two) times daily as needed (FOR NASAL CONGESTION.).  11/20/13   [provider]  furosemide (LASIX) 40 MG tablet Take 1 tablet (40 mg total) by mouth daily. 10/13/18   Satira Sark, MD  halobetasol (ULTRAVATE) 0.05 % cream Apply 1 application topically 2 (two) times  daily as needed (psoriasis).     [provider]  levothyroxine (SYNTHROID, LEVOTHROID) 137 MCG tablet Take 137 mcg by mouth daily before breakfast. For thyroid therapy    [provider]  Magnesium 400 MG CAPS Take 400 mg by mouth 2 (two) times a day.     [provider]  metoprolol tartrate (LOPRESSOR) 25 MG tablet Take 1 tablet (25 mg total) by mouth 2 (two) times daily as needed. 04/20/19 07/19/19  Imogene Burn,  PA-C  mirtazapine (REMERON) 15 MG tablet Take 1 tablet (15 mg total) by mouth at bedtime. 04/08/19   Heath Lark, MD  oxyCODONE (ROXICODONE) 15 MG immediate release tablet Take 1 tablet (15 mg total) by mouth every 4 (four) hours as needed for pain. 03/30/19   Heath Lark, MD  polyvinyl alcohol (LIQUIFILM TEARS) 1.4 % ophthalmic solution Place 1 drop into both eyes 2 (two) times daily as needed for dry eyes.     [provider]  potassium chloride SA (K-DUR,KLOR-CON) 20 MEQ tablet TAKE 1 TABLET BY MOUTH DAILY AND TAKE 1 ADDITIONAL TABLET WHEN TAKING EXTRA LASIX DOSE Patient taking differently: Take 20 mEq by mouth daily.  11/10/18   Satira Sark, MD  STIOLTO RESPIMAT 2.5-2.5 MCG/ACT AERS INHALE 2 PUFFS INTO THE LUNGS DAILY Patient taking differently: Inhale 2 puffs into the lungs daily.  02/02/19   Collene Gobble, MD  XARELTO 20 MG TABS tablet TAKE 1 TABLET BY MOUTH EVERY DAY AFTER SUPPER 10/06/18   Satira Sark, MD  Calcium Carbonate (CALCIUM 600) 1500 MG TABS Take 2 tablets by mouth daily.    09/11/18  [provider]    Family History Family History  Problem Relation Age of Onset  . Heart disease Father        pacemaker  . Lung cancer Father        smoker; deceased 81  . Colon cancer Mother 69       2nd rectal ca at 59  . Colon cancer Maternal Grandfather        dx 82s; deceased 16s  . Depression Sister   . Leukemia Maternal Aunt        deceased 54    Social History Social History   Tobacco Use  . Smoking status:  Former Smoker    Packs/day: 1.00    Years: 20.00    Pack years: 20.00    Types: Cigarettes    Quit date: 10/08/1980    Years since quitting: 38.5  . Smokeless tobacco: Never Used  Substance Use Topics  . Alcohol use: Not Currently    Comment: rarely, 12-23-15 rarely  . Drug use: No     Allergies   Codeine   Review of Systems Review of Systems  Constitutional: Negative for diaphoresis and fever.  Eyes: Negative for redness.  Respiratory: Positive for shortness of breath (chronic, stable). Negative for cough and chest tightness.   Cardiovascular: Negative for chest pain, palpitations and leg swelling.  Gastrointestinal: Negative for abdominal pain, nausea and vomiting.  Genitourinary: Negative for dysuria.  Musculoskeletal: Negative for back pain and neck pain.  Skin: Negative for rash.  Neurological: Positive for weakness. Negative for syncope and light-headedness.  Psychiatric/Behavioral: The patient is not nervous/anxious.      Physical Exam Updated Vital Signs BP 92/60   Pulse 62   Temp 98.2 F (36.8 C)   Resp 18   Ht 5' 4"  (1.626 m)   Wt 77.1 kg   BMI 29.18 kg/m   Physical Exam Vitals signs and nursing note reviewed.  Constitutional:      Appearance: She is well-developed.  HENT:     Head: Normocephalic and atraumatic.  Eyes:     General:        Right eye: No discharge.        Left eye: No discharge.     Conjunctiva/sclera: Conjunctivae normal.  Neck:     Musculoskeletal: Normal range of motion and neck supple.  Cardiovascular:  Rate and Rhythm: Normal rate and regular rhythm.     Heart sounds: Normal heart sounds. No murmur.  Pulmonary:     Effort: Pulmonary effort is normal.     Breath sounds: Normal breath sounds.  Abdominal:     Palpations: Abdomen is soft.     Tenderness: There is no abdominal tenderness.  Genitourinary:    Rectum: Guaiac result positive. No mass, tenderness, external hemorrhoid or internal hemorrhoid.  Musculoskeletal:         General: No swelling.     Right lower leg: No edema.     Left lower leg: No edema.  Skin:    General: Skin is warm and dry.     Comments: Normal cap refill  Neurological:     Mental Status: She is alert.      ED Treatments / Results  Labs (all labs ordered are listed, but only abnormal results are displayed) Labs Reviewed  CBC WITH DIFFERENTIAL/PLATELET - Abnormal; Notable for the following components:      Result Value   RBC 2.82 (*)    Hemoglobin 6.6 (*)    HCT 22.7 (*)    MCH 23.4 (*)    MCHC 29.1 (*)    RDW 18.1 (*)    Abs Immature Granulocytes 0.12 (*)    All other components within normal limits  BASIC METABOLIC PANEL - Abnormal; Notable for the following components:   Sodium 133 (*)    Potassium 3.4 (*)    Chloride 95 (*)    Glucose, Bld 127 (*)    Creatinine, Ser 1.35 (*)    Calcium 8.4 (*)    GFR calc non Af Amer 36 (*)    GFR calc Af Amer 42 (*)    All other components within normal limits  POC OCCULT BLOOD, ED - Abnormal; Notable for the following components:   Fecal Occult Bld POSITIVE (*)    All other components within normal limits  SARS CORONAVIRUS 2 (HOSPITAL ORDER, [facility])  LACTIC ACID, PLASMA  URINALYSIS, ROUTINE W REFLEX MICROSCOPIC  TYPE AND SCREEN  PREPARE RBC (CROSSMATCH)  TROPONIN I (HIGH SENSITIVITY)  TROPONIN I (HIGH SENSITIVITY)    EKG EKG Interpretation  Date/Time:  Sunday April 26 2019 14:07:33 EDT Ventricular Rate:  62 PR Interval:    QRS Duration: 89 QT Interval:  393 QTC Calculation: 399 R Axis:   85 Text Interpretation:  Sinus rhythm Borderline right axis deviation Low voltage, precordial leads ECG OTHERWISE WITHIN NORMAL LIMITS Confirmed by Noemi Chapel [id number]) on 04/26/2019 2:19:51 PM   Radiology Dg Chest 2 View  Result Date: 04/26/2019 CLINICAL DATA:  Chest discomfort, hx of a-fib, low O2 sat at home- pt undergoing chemo for ovarian cancer EXAM: CHEST - 2 VIEW COMPARISON:  04/17/2019  FINDINGS: Power injectable right IJ Port-A-Cath tip: SVC. Atherosclerotic calcification of the aortic arch. Heart size within normal limits. Blunting of both costophrenic angles compatible with pleural effusions. Subtle subpleural lucency along the right lung apex; although this may be from a skin wrinkle, I cannot completely exclude a small apical pneumothorax. New nodularity in the left lung apex including a 1.3 cm pulmonary nodule projecting of the left first rib and perhaps another adjacent nodule. Thoracic spondylosis. Linear subsegmental atelectasis at the left lung base. IMPRESSION: 1. I cannot exclude a tiny right apical pneumothorax. Also, there are potential new pulmonary nodules at the left lung apex. Chest CT is recommended for further characterization. 2. Small bilateral pleural effusions.  3.  Aortic Atherosclerosis (ICD10-I70.0). Electronically Signed   By: Van Clines M.D.   On: 04/26/2019 15:22   Ct Chest Wo Contrast  Result Date: 04/26/2019 CLINICAL DATA:  85 year old presenting with current history of ovarian cancer for which she is undergoing chemotherapy, current history of atrial fibrillation, presenting with chest discomfort. Abnormal chest x-ray earlier today questioning a RIGHT apically pneumothorax and LEFT apically lung nodules. EXAM: CT CHEST WITHOUT CONTRAST TECHNIQUE: Multidetector CT imaging of the chest was performed following the standard protocol without IV contrast. COMPARISON:  No prior chest CT. FINDINGS: Cardiovascular: Heart size upper normal to slightly enlarged. Severe three-vessel coronary atherosclerosis. RIGHT jugular Port-A-Cath tip at the cavoatrial junction. No pericardial effusion. Moderate to severe atherosclerosis involving the thoracic and proximal abdominal aorta without evidence of aneurysm. Mediastinum/Nodes: Numerous normal sized lymph nodes throughout the mediastinum. No pathologic lymphadenopathy. Fluid in the mid portion of the normal appearing  esophagus without evidence of hiatal hernia. Mild thyromegaly without nodularity. Lungs/Pleura: No evidence of a RIGHT pneumothorax or LEFT apical nodules as questioned on the earlier chest x-ray. Emphysematous changes in both lungs. No parenchymal nodules or masses to suggest metastatic disease. Moderately large RIGHT pleural effusion and associated consolidation in the RIGHT LOWER LOBE. Small LEFT pleural effusion and associated mild consolidation in the LEFT LOWER LOBE. Moderate BILATERAL lower lobe and mild BILATERAL upper lobe bronchiectasis. Central airways patent with moderate to marked bronchial wall thickening. Upper Abdomen: Minimal perihepatic ascites. Visualized upper abdomen otherwise unremarkable for the unenhanced technique. Musculoskeletal: Osseous demineralization. Benign bone island in the T11 vertebral body. Degenerative changes and DISH involving the mid and LOWER thoracic spine. No acute findings. IMPRESSION: 1. No evidence of a RIGHT pneumothorax or LEFT apical nodules as questioned on the earlier chest x-ray. 2. Moderately large RIGHT pleural effusion and small LEFT pleural effusion with associated passive atelectasis (favored over pneumonia) in the lower lobes. 3. Moderate to marked central bronchial wall thickening indicating asthma and/or bronchitis. 4. Moderate bilateral lower lobe and mild bilateral upper lobe bronchiectasis. 5. Minimal perihepatic ascites. Aortic Atherosclerosis (ICD10-I70.0) and Emphysema (ICD10-J43.9). Electronically Signed   By: Evangeline Dakin M.D.   On: 04/26/2019 16:19    Procedures Procedures (including critical care time)  Medications Ordered in ED Medications  0.9 %  sodium chloride infusion (has no administration in time range)     Initial Impression / Assessment and Plan / ED Course  I have reviewed the triage vital signs and the nursing notes.  Pertinent labs & imaging results that were available during my care of the patient were reviewed by  me and considered in my medical decision making (see chart for details).        Patient seen and examined. Work-up initiated. EKG reviewed. Pt chronically deconditioned.   Vital signs reviewed and are as follows: BP 92/60   Pulse 62   Temp 98.2 F (36.8 C)   Resp 18   Ht 5' 4"  (1.626 m)   Wt 77.1 kg   BMI 29.18 kg/m   Work-up significant for anemia.  We will also need to order chest CT to rule out small pneumothorax per radiology read on x-ray.  Chest CT completed, shows chronic pleural effusion.  No pneumothorax.  Rectal exam performed with RN chaperone.  Stool is brown with red tint, heme +.   Blood transfusion ordered.  Reviewed all results with patient and family at bedside.  Plan is to admit for blood transfusion, monitoring of GI bleeding.  Chemotherapy scheduled  for tomorrow will likely be postponed.  Spoke with Dr. Denton Brick who will see patient. BP improving.   BP (!) 106/52   Pulse (!) 58   Temp 98.2 F (36.8 C)   Resp 12   Ht 5' 4"  (1.626 m)   Wt 77.1 kg   SpO2 95%   BMI 29.18 kg/m   CRITICAL CARE Performed by: Carlisle Cater PA-C Total critical care time: 40 minutes Critical care time was exclusive of separately billable procedures and treating other patients. Critical care was necessary to treat or prevent imminent or life-threatening deterioration. Critical care was time spent personally by me on the following activities: development of treatment plan with patient and/or surrogate as well as nursing, discussions with consultants, evaluation of patient's response to treatment, examination of patient, obtaining history from patient or surrogate, ordering and performing treatments and interventions, ordering and review of laboratory studies, ordering and review of radiographic studies, pulse oximetry and re-evaluation of patient's condition.   Final Clinical Impressions(s) / ED Diagnoses   Final diagnoses:  Symptomatic anemia  Lower GI bleed  Chest  discomfort   Admit.   ED Discharge Orders    None       Carlisle Cater, Hershal Coria 04/26/19 1731    Noemi Chapel, MD 04/27/19 0900

## 2019-04-27 ENCOUNTER — Inpatient Hospital Stay: Payer: Medicare Other

## 2019-04-27 ENCOUNTER — Other Ambulatory Visit: Payer: Self-pay

## 2019-04-27 ENCOUNTER — Inpatient Hospital Stay: Payer: Medicare Other | Admitting: Hematology and Oncology

## 2019-04-27 ENCOUNTER — Encounter (HOSPITAL_COMMUNITY): Payer: Self-pay | Admitting: Gastroenterology

## 2019-04-27 DIAGNOSIS — D6481 Anemia due to antineoplastic chemotherapy: Secondary | ICD-10-CM | POA: Diagnosis not present

## 2019-04-27 DIAGNOSIS — D649 Anemia, unspecified: Secondary | ICD-10-CM | POA: Diagnosis not present

## 2019-04-27 LAB — CBC
HCT: 28.2 % — ABNORMAL LOW (ref 36.0–46.0)
Hemoglobin: 8.6 g/dL — ABNORMAL LOW (ref 12.0–15.0)
MCH: 24.4 pg — ABNORMAL LOW (ref 26.0–34.0)
MCHC: 30.5 g/dL (ref 30.0–36.0)
MCV: 79.9 fL — ABNORMAL LOW (ref 80.0–100.0)
Platelets: 273 10*3/uL (ref 150–400)
RBC: 3.53 MIL/uL — ABNORMAL LOW (ref 3.87–5.11)
RDW: 16.5 % — ABNORMAL HIGH (ref 11.5–15.5)
WBC: 8.5 10*3/uL (ref 4.0–10.5)
nRBC: 0 % (ref 0.0–0.2)

## 2019-04-27 LAB — ABO/RH: ABO/RH(D): O POS

## 2019-04-27 MED ORDER — DILTIAZEM HCL ER COATED BEADS 180 MG PO CP24
180.0000 mg | ORAL_CAPSULE | Freq: Two times a day (BID) | ORAL | Status: DC
  Administered 2019-04-27: 180 mg via ORAL
  Filled 2019-04-27: qty 1

## 2019-04-27 MED ORDER — HEPARIN SOD (PORK) LOCK FLUSH 100 UNIT/ML IV SOLN
500.0000 [IU] | INTRAVENOUS | Status: DC | PRN
  Filled 2019-04-27: qty 5

## 2019-04-27 NOTE — Consult Note (Signed)
_0 @   Referring Provider: [facility] Primary Care Physician:  Asencion Noble, MD Primary Gastroenterologist:  Dr. Henrene Pastor  Date of Admission: 04/26/19 Date of Consultation: 04/27/19  Reason for Consultation:  Symptomatic Anemia, positive IFOBT, on anticoagualtion  HPI:  [name] is a 85 y.o. year old female with past medical history significant for COPD, atrial fibrillation on Xarelto, diastolic CHF, hypothyroidism, psoriatic arthritis, left ovarian cancer, peripheral artery disease, who presented to the ED with complaints of " her heart not feeling right". She reports her heart rate would drop down to 30s of 40s feeling at home. In the ED, patients blood pressure systolic 300 to 762.  Heart rate 50s to 80s. Patients hemoglobin was found to be 6.6 with microcytic indices, baseline about 9. Positive IFOBT. Platelets normal. CT chest with moderately large RIGHT pleural effusion and small LEFT pleural in the lower lobes effusion with associated passive atelectasis. Moderate to marked central bronchial wall thickening indicating asthma and/or bronchitis. Moderate bilateral lower lobe and mild bilateral upper lobe bronchiectasis. Minimal perihepatic ascites. Patient received 1 unit PRBC and was admitted for symptomatic anemia. Hospitalists ordered a second unit of PRBCs, but this has not been administered.  Last EGD 04/01/2012: Schatzki ring in distal esophagus. Small hiatal hernia. GERD. Otherwise normal.  Last Colonoscopy 01/11/2010: Normal terminal ileum. Moderate diverticulosis in left colon. Otherwise normal exam s/p random biopsies. Internal hemorrhoids. Biopsies with benign colonic mucosa with increased intraepithelial lymphocytes consistent with lymphocytic colitis.   Today: States she felt bad all week. Came to the ED because her heart rate was really low. Usually is high with a. Fib. Takes Chemo infusions, last dose 2 weeks ago. Supposed to have chemo today. 1 year ago had  diverticular bleed. No abdominal pain. No black stools, no bright red blood. States she gets constipatied occasionally due to oxycodone. Will take stool softeners and occasionally laxative. Last BM was yesterday or the day before. Typically has loose stools due to lymphocytic colitis. No N/V, reflux, heartburn, dysphagia.   States she is feeling better since her pint of blood. Since being on chemo, she states she has had to have blood several times without noticeable blood loss. Thinks at least 3 times. Started chemo October 2018. Thinks her hemoglobin is around 8-9 range.   Denies fever, chills, lightheadedness, dizziness, chest pain, palpitations. Admits SOB with walking, occasional cough. Feels she is at her baseline. Nasal canula at home, 2L. No hematuria, no vaginal discharge.   Last dose of Xarelto was the night of 7/18. No NSAIDs. Only takes tylenol.   PACLitaxel for chemotherapy treatment started in May. Has been on carboplatin in the past.   Past Medical History:  Diagnosis Date  . Anxiety   . Chronic blood loss anemia    03-04-2018 diverticular bleed and rectal bleeding,  transfused 2 units PRBCs 03-08-2018  . Colitis   . COPD (chronic obstructive pulmonary disease) (HCC)    Dr. Lamonte Sakai  . Depression   . Diastolic CHF, chronic ([place])   . Diverticulosis   . Family history of colon cancer   . Fibromyalgia   . Genetic testing 04/07/2018   MyRisk (35 genes) @ [facility] - No pathogenic mutations detected  . GERD (gastroesophageal reflux disease)   . Hemorrhoids   . Hiatal hernia   . Hip pain 07/2018   Right Hip Pain  . History of rectal polyps   . History of shingles   . Hypothyroidism   . IBS (irritable bowel syndrome)   .  Lymphocytic colitis    Dr. Henrene Pastor  . Malignant ascites    Admission 06/2017 abdominal s/p parencentesis 07-01-2017 2.5L, 07-08-2016  2.7L, 07-12-2017  1462m  . [place]otherapeutic drug ([place]   . On home O2   . Osteoporosis   . Ovarian cancer  ([facility]    Chemotherapy - Dr. GAlvy Bimler . [place]    Xarelto stopped 03-07-2018 due to lower GI bleed  . Pleural effusion    s/p  right thoracentesis, 02-2018 1.3L and 03-17-2018 right thoracentesis 6456m, post cxr no residual effusion  . Psoriatic arthritis ([place]  . Schatzki's ring 2013  . Seasonal allergic rhinitis     Past Surgical History:  Procedure Laterality Date  . CARDIOVASCULAR STRESS TEST  09/23/2012   Low risk lexiscan nuclear study w/ apical thinning but no evidence of ischemia/  normal LV function and wall motion , ef 75%  . CATARACT EXTRACTION W/ INTRAOCULAR LENS  IMPLANT, BILATERAL  10/2016  . CHEST TUBE INSERTION Right 06/24/2018   Procedure: INSERTION PLEURAL DRAINAGE CATHETER;  Surgeon: GeGrace IsaacMD;  [place] Service: Thoracic;  Laterality: Right;  . COLONOSCOPY    . DEBULKING N/A 03/20/2018   Procedure: DEBULKING;  Surgeon: PhIsabel CapriceMD;  Location: [facility];  Service: Gynecology;  Laterality: N/A;  . EXAM UNDER ANESTHESIA WITH MANIPULATION OF KNEE Left 12-20-2003  dr waNoemi Chapel post [facility]  . FEMUR IM NAIL Left 12/11/2013   Procedure: INTRAMEDULLARY (IM) NAIL FEMORAL;  Surgeon: FrGearlean AlfMD;  [facility];  Service: Orthopedics;  Laterality: Left;  . HYSTERECTOMY ABDOMINAL WITH SALPINGO-OOPHORECTOMY Bilateral 03/20/2018   Procedure: TOTAL HYSTERECTOMY ABDOMINAL WITH BILATERAL  SALPINGO-OOPHORECTOMY;  Surgeon: PhIsabel CapriceMD;  [facility];  Service: Gynecology;  Laterality: Bilateral;  . IR FLUORO GUIDE PORT INSERTION RIGHT  07/22/2017  . IR PARACENTESIS  07/12/2017  . IR THORACENTESIS ASP PLEURAL SPACE W/IMG GUIDE  03/17/2018  . IR THORACENTESIS ASP PLEURAL SPACE W/IMG GUIDE  05/08/2018  . IR [place] VASC ACCESS RIGHT  07/22/2017  . KNEE ARTHROSCOPY W/ LATERAL RELEASE Left 09-03-2005   [facility] w/  Lysis Adhesions,  excision loose body's  . LAPAROSCOPIC CHOLECYSTECTOMY  12-04-2010  dr zeigler   . LAPAROTOMY N/A 03/20/2018   Procedure: EXPLORATORY LAPAROTOMY;  Surgeon: PhIsabel CapriceMD;  [facility];  Service: Gynecology;  Laterality: N/A;  . OMENTECTOMY N/A 03/20/2018   Procedure: OMENTECTOMY;  Surgeon: PhIsabel CapriceMD;  [facility];  Service: Gynecology;  Laterality: N/A;  . OTHER SURGICAL HISTORY  06/24/2018   Plurex Catheter inserted into the lungs  . TOTAL KNEE ARTHROPLASTY Left 09-08-2003   [facility]. TOTAL KNEE REVISION  08/06/2012   Procedure: TOTAL KNEE REVISION;  Surgeon: FrGearlean AlfMD;  [facility];  Service: Orthopedics;  Laterality: Left;  Left Total Knee Arthroplasty Revision  . TRANSTHORACIC ECHOCARDIOGRAM  08/20/2017   ef 60-65%,  grade 1 diastolic dysfunction/  trivial AR and TR  . Uterine polypectomy      Prior to Admission medications   Medication Sig Start Date End Date Taking? Authorizing Provider  albuterol (PROAIR HFA) 108 (90 Base) MCG/ACT inhaler Inhale 2 puffs into the lungs every 6 (six) hours as needed for wheezing or shortness of breath. 04/01/19  Yes Byrum, RoRose FillersMD  ALPRAZolam (XDuanne Moron0.25 MG tablet Take 1 tablet (0.25 mg total)  by mouth at bedtime as needed for anxiety. 11/15/17  Yes Amin, Jeanella Flattery, MD  Cholecalciferol (VITAMIN D) 2000 units CAPS Take 2,000 Units by mouth daily.    Yes [provider]  dicyclomine (BENTYL) 10 MG capsule Take 1 capsule (10 mg total) by mouth 2 (two) times daily. 03/03/19  Yes [name], MD  diltiazem (CARDIZEM CD) 180 MG 24 hr capsule Take 1 capsule (180 mg total) by mouth at bedtime. 04/17/19  Yes Francine Graven, DO  diphenhydrAMINE (BENADRYL) 25 mg capsule Take 25 mg by mouth every 8 (eight) hours as needed for itching.    Yes [provider]  diphenoxylate-atropine (LOMOTIL) 2.5-0.025 MG tablet Take 1 tablet by mouth 4 (four) times daily. Patient taking differently: Take 1 tablet by mouth 4 (four) times daily as needed for diarrhea or loose stools.   08/21/16  Yes [name], MD  DULoxetine (CYMBALTA) 60 MG capsule TAKE 1 CAPSULE(60 MG) BY MOUTH DAILY Patient taking differently: Take 60 mg by mouth at bedtime.  01/27/19  Yes Gorsuch, Ni, MD  fluticasone (FLONASE) 50 MCG/ACT nasal spray Place 2 sprays into both nostrils 2 (two) times daily as needed (FOR NASAL CONGESTION.).  11/20/13  Yes [provider]  furosemide (LASIX) 40 MG tablet Take 1 tablet (40 mg total) by mouth daily. 10/13/18  Yes Satira Sark, MD  halobetasol (ULTRAVATE) 0.05 % cream Apply 1 application topically 2 (two) times daily as needed (psoriasis).    Yes [provider]  levothyroxine (SYNTHROID, LEVOTHROID) 137 MCG tablet Take 137 mcg by mouth daily before breakfast. For thyroid therapy   Yes [provider]  Magnesium 400 MG CAPS Take 400 mg by mouth 2 (two) times a day.    Yes [provider]  metoprolol tartrate (LOPRESSOR) 25 MG tablet Take 1 tablet (25 mg total) by mouth 2 (two) times daily as needed. 04/20/19 07/19/19 Yes Imogene Burn, PA-C  mirtazapine (REMERON) 15 MG tablet Take 1 tablet (15 mg total) by mouth at bedtime. 04/08/19  Yes Gorsuch, Ni, MD  oxyCODONE (ROXICODONE) 15 MG immediate release tablet Take 1 tablet (15 mg total) by mouth every 4 (four) hours as needed for pain. 03/30/19  Yes Gorsuch, Ni, MD  polyvinyl alcohol (LIQUIFILM TEARS) 1.4 % ophthalmic solution Place 1 drop into both eyes 2 (two) times daily as needed for dry eyes.    Yes [provider]  potassium chloride SA (K-DUR,KLOR-CON) 20 MEQ tablet TAKE 1 TABLET BY MOUTH DAILY AND TAKE 1 ADDITIONAL TABLET WHEN TAKING EXTRA LASIX DOSE Patient taking differently: Take 20 mEq by mouth daily.  11/10/18  Yes Satira Sark, MD  STIOLTO RESPIMAT 2.5-2.5 MCG/ACT AERS INHALE 2 PUFFS INTO THE LUNGS DAILY Patient taking differently: Inhale 2 puffs into the lungs daily.  02/02/19  Yes Byrum, Rose Fillers, MD  XARELTO 20 MG TABS tablet TAKE 1 TABLET BY MOUTH  EVERY DAY AFTER SUPPER Patient taking differently: Take 20 mg by mouth daily with supper.  10/06/18  Yes Satira Sark, MD  azelastine (ASTELIN) 0.1 % nasal spray Place 2 sprays into both nostrils 2 (two) times daily as needed for allergies.     [provider]  benzonatate (TESSALON) 100 MG capsule Take 1 capsule (100 mg total) by mouth every 6 (six) hours as needed for cough. Patient not taking: Reported on 04/26/2019 03/19/19   Collene Gobble, MD  diltiazem (CARDIZEM CD) 180 MG 24 hr capsule TAKE ONE CAPSULE BY MOUTH  EVERY MORNING Patient not taking: No sig reported 02/23/19   Satira Sark, MD  Calcium Carbonate (CALCIUM 600) 1500 MG TABS Take 2 tablets by mouth daily.    09/11/18  [provider]    Current Facility-Administered Medications  Medication Dose Route Frequency Provider Last Rate Last Dose  . 0.9 %  sodium chloride infusion (Manually program via Guardrails IV Fluids)   Intravenous Once Emokpae, Ejiroghene E, MD      . acetaminophen (TYLENOL) tablet 650 mg  650 mg Oral Q6H PRN Emokpae, Ejiroghene E, MD       Or  . acetaminophen (TYLENOL) suppository 650 mg  650 mg Rectal Q6H PRN Emokpae, Ejiroghene E, MD      . ALPRAZolam (XANAX) tablet 0.25 mg  0.25 mg Oral QHS PRN Emokpae, Ejiroghene E, MD      . dicyclomine (BENTYL) capsule 10 mg  10 mg Oral BID Emokpae, Ejiroghene E, MD   10 mg at 04/26/19 2220  . diltiazem (CARDIZEM CD) 24 hr capsule 180 mg  180 mg Oral QHS Emokpae, Ejiroghene E, MD      . DULoxetine (CYMBALTA) DR capsule 60 mg  60 mg Oral QHS Emokpae, Ejiroghene E, MD   60 mg at 04/26/19 2220  . fluticasone (FLONASE) 50 MCG/ACT nasal spray 2 spray  2 spray Each Nare BID PRN Emokpae, Ejiroghene E, MD      . furosemide (LASIX) tablet 40 mg  40 mg Oral Daily Emokpae, Ejiroghene E, MD      . ipratropium-albuterol (DUONEB) 0.5-2.5 (3) MG/3ML nebulizer solution 3 mL  3 mL Nebulization Q4H PRN Emokpae, Ejiroghene E, MD      . levothyroxine (SYNTHROID)  tablet 137 mcg  137 mcg Oral QAC breakfast Emokpae, Ejiroghene E, MD   137 mcg at 04/27/19 0549  . mirtazapine (REMERON) tablet 15 mg  15 mg Oral QHS Emokpae, Ejiroghene E, MD   15 mg at 04/26/19 2220  . mometasone-formoterol (DULERA) 100-5 MCG/ACT inhaler 2 puff  2 puff Inhalation BID Emokpae, Ejiroghene E, MD   2 puff at 04/26/19 2135  . ondansetron (ZOFRAN) tablet 4 mg  4 mg Oral Q6H PRN Emokpae, Ejiroghene E, MD       Or  . ondansetron (ZOFRAN) injection 4 mg  4 mg Intravenous Q6H PRN Emokpae, Ejiroghene E, MD      . oxyCODONE (Oxy IR/ROXICODONE) immediate release tablet 15 mg  15 mg Oral Q4H PRN Emokpae, Ejiroghene E, MD      . pantoprazole (PROTONIX) injection 40 mg  40 mg Intravenous Q24H Emokpae, Ejiroghene E, MD   40 mg at 04/26/19 2220  . polyethylene glycol (MIRALAX / GLYCOLAX) packet 17 g  17 g Oral Daily PRN Emokpae, Ejiroghene E, MD      . potassium chloride SA (K-DUR) CR tablet 20 mEq  20 mEq Oral Daily Emokpae, Ejiroghene E, MD       Facility-Administered Medications Ordered in Other Encounters  Medication Dose Route Frequency Provider Last Rate Last Dose  . 0.9 %  sodium chloride infusion (Manually program via Guardrails IV Fluids)  250 mL Intravenous Once Heath Lark, MD   Stopped at 12/16/18 1500    Allergies as of 04/26/2019 - Review Complete 04/26/2019  Allergen Reaction Noted  . Codeine Itching     Family History  Problem Relation Age of Onset  . Heart disease Father        pacemaker  . Lung cancer Father        smoker;  deceased 54  . Colon cancer Mother 55       2nd rectal ca at 43  . Colon cancer Maternal Grandfather        dx 48s; deceased 8s  . Depression Sister   . Leukemia Maternal Aunt        deceased 19    Social History   Socioeconomic History  . Marital status: Divorced    Spouse name: Not on file  . Number of children: 1  . Years of education: 56  . Highest education level: GED or equivalent  Occupational History  . Occupation: retired     Fish farm manager: RETIRED  Social Needs  . Financial resource strain: Not hard at all  . Food insecurity    Worry: Never true    Inability: Never true  . Transportation needs    Medical: No    Non-medical: No  Tobacco Use  . Smoking status: Former Smoker    Packs/day: 1.00    Years: 20.00    Pack years: 20.00    Types: Cigarettes    Quit date: 10/08/1980    Years since quitting: 38.5  . Smokeless tobacco: Never Used  Substance and Sexual Activity  . Alcohol use: Not Currently    Comment: rarely, 12-23-15 rarely  . Drug use: No  . Sexual activity: Not Currently  Lifestyle  . Physical activity    Days per week: 0 days    Minutes per session: 0 min  . Stress: Not at all  Relationships  . Social connections    Talks on phone: More than three times a week    Gets together: Three times a week    Attends religious service: More than 4 times per year    Active member of club or organization: No    Attends meetings of clubs or organizations: Never    Relationship status: Divorced  . Intimate partner violence    Fear of current or ex partner: No    Emotionally abused: No    Physically abused: No    Forced sexual activity: No  Other Topics Concern  . Not on file  Social History Narrative   Patient lives at home by herself.    Patient is retired.    Patient has 12 th grade education.           Review of Systems: Gen: See HPI CV: See HPI Resp: See HPI GI: See HPI  GU : Denies urinary burning, urinary frequency, urinary incontinence.  MS: Admits to neck pain, also some generalized aching and pain. Has known fibromyalgia.  Derm: Denies rash, itching, dry skin Psych: Admits depression, anxiety well controlled with meds.  Heme: Denies bruising, bleeding.   Physical Exam: Vital signs in last 24 hours: Temp:  [98.2 F (36.8 C)-98.8 F (37.1 C)] 98.5 F (36.9 C) (07/20 0624) Pulse Rate:  [55-88] 86 (07/20 0624) Resp:  [12-27] 20 (07/20 0624) BP: (92-135)/(38-70) 115/54 (07/20  0624) SpO2:  [92 %-100 %] 97 % (07/20 0624) Weight:  [77.1 kg] 77.1 kg (07/19 1410) Last BM Date: 04/25/19 General:   Alert,  pleasant and cooperative in NAD. On nasal canula.  Head:  Normocephalic and atraumatic. Eyes:  Sclera clear, no icterus. Conjunctiva pink. Ears:  Normal auditory acuity. Nose:  No deformity, discharge,  or lesions. Mouth:  No deformity or lesions, dentition normal. Lungs:  Crackles noted in lung bases bilaterally. No wheezes, or rhonchi. No acute distress. Heart:  Regular rate and rhythm; no murmurs, clicks,  rubs,  or gallops. Abdomen:  Soft, nontender and nondistended. No masses, hepatosplenomegaly or hernias noted. Normal bowel sounds, without guarding, and without rebound.   Rectal:  Deferred  Msk:  Symmetrical without gross deformities. Normal posture. Pulses:  Normal pulses noted. Extremities:  Without clubbing or edema. Neurologic:  Alert and  oriented x4;  grossly normal neurologically. Skin:  Intact without significant lesions or rashes. Psych:  Alert and cooperative. Normal mood and affect.  Lab Results: Recent Labs    04/26/19 1455 04/26/19 2316 04/27/19 0539  WBC 7.2  --  8.5  HGB 6.6* 8.2* 8.6*  HCT 22.7* 26.8* 28.2*  PLT 224  --  273   BMET Recent Labs    04/26/19 1455  NA 133*  K 3.4*  CL 95*  CO2 28  GLUCOSE 127*  BUN 21  CREATININE 1.35*  CALCIUM 8.4*    Studies/Results: Dg Chest 2 View  Result Date: 04/26/2019 CLINICAL DATA:  Chest discomfort, hx of a-fib, low O2 sat at home- pt undergoing chemo for ovarian cancer EXAM: CHEST - 2 VIEW COMPARISON:  04/17/2019 FINDINGS: Power injectable right IJ Port-A-Cath tip: SVC. Atherosclerotic calcification of the aortic arch. Heart size within normal limits. Blunting of both costophrenic angles compatible with pleural effusions. Subtle subpleural lucency along the right lung apex; although this may be from a skin wrinkle, I cannot completely exclude a small apical pneumothorax. New  nodularity in the left lung apex including a 1.3 cm pulmonary nodule projecting of the left first rib and perhaps another adjacent nodule. Thoracic spondylosis. Linear subsegmental atelectasis at the left lung base. IMPRESSION: 1. I cannot exclude a tiny right apical pneumothorax. Also, there are potential new pulmonary nodules at the left lung apex. Chest CT is recommended for further characterization. 2. Small bilateral pleural effusions. 3.  Aortic Atherosclerosis (ICD10-I70.0). Electronically Signed   By: Van Clines M.D.   On: 04/26/2019 15:22   Ct Chest Wo Contrast  Result Date: 04/26/2019 CLINICAL DATA:  85 year old presenting with current history of ovarian cancer for which she is undergoing chemotherapy, current history of atrial fibrillation, presenting with chest discomfort. Abnormal chest x-ray earlier today questioning a RIGHT apically pneumothorax and LEFT apically lung nodules. EXAM: CT CHEST WITHOUT CONTRAST TECHNIQUE: Multidetector CT imaging of the chest was performed following the standard protocol without IV contrast. COMPARISON:  No prior chest CT. FINDINGS: Cardiovascular: Heart size upper normal to slightly enlarged. Severe three-vessel coronary atherosclerosis. RIGHT jugular Port-A-Cath tip at the cavoatrial junction. No pericardial effusion. Moderate to severe atherosclerosis involving the thoracic and proximal abdominal aorta without evidence of aneurysm. Mediastinum/Nodes: Numerous normal sized lymph nodes throughout the mediastinum. No pathologic lymphadenopathy. Fluid in the mid portion of the normal appearing esophagus without evidence of hiatal hernia. Mild thyromegaly without nodularity. Lungs/Pleura: No evidence of a RIGHT pneumothorax or LEFT apical nodules as questioned on the earlier chest x-ray. Emphysematous changes in both lungs. No parenchymal nodules or masses to suggest metastatic disease. Moderately large RIGHT pleural effusion and associated consolidation in the  RIGHT LOWER LOBE. Small LEFT pleural effusion and associated mild consolidation in the LEFT LOWER LOBE. Moderate BILATERAL lower lobe and mild BILATERAL upper lobe bronchiectasis. Central airways patent with moderate to marked bronchial wall thickening. Upper Abdomen: Minimal perihepatic ascites. Visualized upper abdomen otherwise unremarkable for the unenhanced technique. Musculoskeletal: Osseous demineralization. Benign bone island in the T11 vertebral body. Degenerative changes and DISH involving the mid and LOWER thoracic spine. No acute findings. IMPRESSION: 1. No evidence of a  RIGHT pneumothorax or LEFT apical nodules as questioned on the earlier chest x-ray. 2. Moderately large RIGHT pleural effusion and small LEFT pleural effusion with associated passive atelectasis (favored over pneumonia) in the lower lobes. 3. Moderate to marked central bronchial wall thickening indicating asthma and/or bronchitis. 4. Moderate bilateral lower lobe and mild bilateral upper lobe bronchiectasis. 5. Minimal perihepatic ascites. Aortic Atherosclerosis (ICD10-I70.0) and Emphysema (ICD10-J43.9). Electronically Signed   By: Evangeline Dakin M.D.   On: 04/26/2019 16:19    Impression: 85 y.o. female with past medical history significant for COPD, atrial fibrillation on Xarelto, diastolic CHF, hypothyroidism, psoriatic arthritis, left ovarian cancer, peripheral artery disease, who presented to the ED with complaints of low heart rate. She was found to have hemoglobin 6.6 and IFOBT positive in the ED. She was admitted for symptomatic anemia. Received 1 unit PRBCs in ED. Hemoglobin 8.6 today. Platelets  273. Patient has remained hemodynamically stable.   Patient has been receiving chemo for ovarian cancer for the last 2 years starting in October 2018. Treatments from 07/2017-01/2018, then 09/2018-01/2019, and started again 02/2019. Was on carboplatin in 2018 through 01/2018. Recently transitioned to Paclitaxel in May 2020. Since  December 2019, patients hemoglobin has been in the 8-9 range at baseline since starting her second round of chemo. She has had some slowly developing microcytic indices with [facility] being slightly low in 2019. In May 2020, [facility] and [facility] were both low. She has received blood transfusions intermittently with oncology as they have been following her hemoglobin. Patient has history of diverticular bleed in 2019 but has not had any overt GI bleeding recently. Patient also has known hemorrhoids. It is possible IFOBT positive is due to slight blood secondary to hemorrhoids. I suspect her anemia of likely secondary to her chemotherapy as well as her known chronic kidney disease. Of note, carboplatin has black box warning of bone marrow suppression with anemia possibly requiting blood transfusion. Paclitaxel has side of anemia.   Plan: Follow H/H  Transfuse as necessary Continue PPI  Full liquid diet today NPO at midnight except for sips with meds Discuss with Dr. Oneida Alar role of procedures    LOS: 0 days    04/27/2019, 7:56 AM   Aliene Altes, [facility]

## 2019-04-27 NOTE — Care Management Obs Status (Signed)
[place] NOTIFICATION   Patient Details  Name: [name] MRN: [id number] Date of Birth: 1933/01/16   Medicare Observation Status Notification Given:  Yes    Isaah Furry, Chauncey Reading, RN 04/27/2019, 4:18 PM

## 2019-04-27 NOTE — Discharge Summary (Signed)
Physician Discharge Summary  [name] BBC:[id number] DOB: 05/02/1933 DOA: 04/26/2019  PCP: Asencion Noble, MD  Admit date: 04/26/2019  Discharge date: 04/27/2019  Admitted From:Home  Disposition:  Home  Recommendations for Outpatient Follow-up:  1. Follow up with PCP in 1-2 weeks 2. Obtain repeat CBC within 1 week and follow-up with Dr. Alvy Bimler as scheduled for repeat chemo treatments 3. Continue on prior home medications to include Xarelto at this time and stop if there are any signs of dark or bloody stools as discussed with patient 4. Close follow-up to cardiologist Dr. Domenic Polite for discussion on whether or not benefits outweigh the risks of continued Xarelto treatment at this point given recurrent anemia and need for blood transfusions  Home Health: None  Equipment/Devices:Has home O2 as needed  Discharge Condition: Stable  CODE STATUS: Full  Diet recommendation: Heart Healthy  Brief/Interim Summary: Per HPI: Laura Wedin Priddyis a 85 y.o.femalewith medical history significant forCOPD, atrial fibrillation, diastolic CHF, hypothyroidism, psoriatic arthritis, left ovarian cancer, peripheral artery disease, who presented to the ED with complaints of "her heart not feeling right".She reports her heart rate would drop down to 30s of 40s feeling at home. She denies abdominal pain. No vomiting. No bloody stools or black stools. She is compliant with her Xarelto, her last dose was last night. Patient reports chronic difficulty breathing from COPD that is unchanged, no chest pain, no fevers or chills.  Patient was admitted for acute on chronic symptomatic anemia that was thought to be related to her ongoing chemotherapy treatments.  She has had no signs of any overt bleeding with dark, or bloody stools noted.  She has received 1 unit PRBC transfusion with significant improvement in hemoglobin levels up to 8.6 at this point in time.  She has been seen by GI and discussion was had  with her oncologist and it is currently thought that patient does not merit any need for endoscopy given the fact that she has recurrent anemia that is closely related to her chemotherapy.  She may resume her Xarelto at this time and will need further discussion with her cardiologist to see if she should continue on the medication long-term at this point.  She will follow-up with her oncologist and have repeat labs as well as ongoing chemotherapy has scheduled.  No other acute events noted during the course of this admission.  She is currently stable for discharge.  Discharge Diagnoses:  Active Problems:   Symptomatic anemia  Principal discharge diagnosis: Acute on chronic symptomatic anemia secondary to chemotherapy.  Discharge Instructions  Discharge Instructions    Diet - low sodium heart healthy   Complete by: As directed    Diet - low sodium heart healthy   Complete by: As directed    Increase activity slowly   Complete by: As directed    Increase activity slowly   Complete by: As directed      Allergies as of 04/27/2019      Reactions   Codeine Itching      Medication List    TAKE these medications   albuterol 108 (90 Base) MCG/ACT inhaler Commonly known as: ProAir HFA Inhale 2 puffs into the lungs every 6 (six) hours as needed for wheezing or shortness of breath.   ALPRAZolam 0.25 MG tablet Commonly known as: XANAX Take 1 tablet (0.25 mg total) by mouth at bedtime as needed for anxiety.   azelastine 0.1 % nasal spray Commonly known as: ASTELIN Place 2 sprays into both nostrils 2 (  two) times daily as needed for allergies.   benzonatate 100 MG capsule Commonly known as: TESSALON Take 1 capsule (100 mg total) by mouth every 6 (six) hours as needed for cough.   dicyclomine 10 MG capsule Commonly known as: BENTYL Take 1 capsule (10 mg total) by mouth 2 (two) times daily.   diltiazem 180 MG 24 hr capsule Commonly known as: CARDIZEM CD Take 1 capsule (180 mg total)  by mouth at bedtime. What changed: Another medication with the same name was removed. Continue taking this medication, and follow the directions you see here.   diphenhydrAMINE 25 mg capsule Commonly known as: BENADRYL Take 25 mg by mouth every 8 (eight) hours as needed for itching.   diphenoxylate-atropine 2.5-0.025 MG tablet Commonly known as: LOMOTIL Take 1 tablet by mouth 4 (four) times daily. What changed:   when to take this  reasons to take this   DULoxetine 60 MG capsule Commonly known as: CYMBALTA TAKE 1 CAPSULE(60 MG) BY MOUTH DAILY What changed: See the new instructions.   fluticasone 50 MCG/ACT nasal spray Commonly known as: FLONASE Place 2 sprays into both nostrils 2 (two) times daily as needed (FOR NASAL CONGESTION.).   furosemide 40 MG tablet Commonly known as: LASIX Take 1 tablet (40 mg total) by mouth daily.   halobetasol 0.05 % cream Commonly known as: ULTRAVATE Apply 1 application topically 2 (two) times daily as needed (psoriasis).   levothyroxine 137 MCG tablet Commonly known as: SYNTHROID Take 137 mcg by mouth daily before breakfast. For thyroid therapy   Magnesium 400 MG Caps Take 400 mg by mouth 2 (two) times a day.   metoprolol tartrate 25 MG tablet Commonly known as: LOPRESSOR Take 1 tablet (25 mg total) by mouth 2 (two) times daily as needed.   mirtazapine 15 MG tablet Commonly known as: REMERON Take 1 tablet (15 mg total) by mouth at bedtime.   oxyCODONE 15 MG immediate release tablet Commonly known as: ROXICODONE Take 1 tablet (15 mg total) by mouth every 4 (four) hours as needed for pain.   polyvinyl alcohol 1.4 % ophthalmic solution Commonly known as: LIQUIFILM TEARS Place 1 drop into both eyes 2 (two) times daily as needed for dry eyes.   potassium chloride SA 20 MEQ tablet Commonly known as: K-DUR TAKE 1 TABLET BY MOUTH DAILY AND TAKE 1 ADDITIONAL TABLET WHEN TAKING EXTRA LASIX DOSE What changed: See the new instructions.    Stiolto Respimat 2.5-2.5 MCG/ACT Aers Generic drug: Tiotropium Bromide-Olodaterol INHALE 2 PUFFS INTO THE LUNGS DAILY What changed: See the new instructions.   Vitamin D 50 MCG (2000 UT) Caps Take 2,000 Units by mouth daily.   Xarelto 20 MG Tabs tablet Generic drug: rivaroxaban TAKE 1 TABLET BY MOUTH EVERY DAY AFTER SUPPER What changed: See the new instructions.      Follow-up Information    Asencion Noble, MD Follow up in 1 week(s).   Specialty: Internal Medicine Contact information: [address] [place] [address] [phone]        Satira Sark, MD .   Specialty: Cardiology Contact information: [place] [address] [phone]          Allergies  Allergen Reactions  . Codeine Itching    Consultations:  GI   Procedures/Studies: Dg Chest 2 View  Result Date: 04/26/2019 CLINICAL DATA:  Chest discomfort, hx of a-fib, low O2 sat at home- pt undergoing chemo for ovarian cancer EXAM: CHEST - 2 VIEW COMPARISON:  04/17/2019 FINDINGS: Power injectable right IJ Port-A-Cath tip: SVC. Atherosclerotic calcification of the aortic arch. Heart size within normal limits. Blunting of both costophrenic angles compatible with pleural effusions. Subtle subpleural lucency along the right lung apex; although this may be from a skin wrinkle, I cannot completely exclude a small apical pneumothorax. New nodularity in the left lung apex including a 1.3 cm pulmonary nodule projecting of the left first rib and perhaps another adjacent nodule. Thoracic spondylosis. Linear subsegmental atelectasis at the left lung base. IMPRESSION: 1. I cannot exclude a tiny right apical pneumothorax. Also, there are potential new pulmonary nodules at the left lung apex. Chest CT is recommended for further characterization. 2. Small bilateral pleural effusions. 3.  Aortic Atherosclerosis (ICD10-I70.0). Electronically Signed   By: Van Clines M.D.   On: 04/26/2019 15:22    Dg Chest 2 View  Result Date: 03/30/2019 CLINICAL DATA:  Reduced breath sounds in both lung bases. EXAM: CHEST - 2 VIEW COMPARISON:  03/11/2019 FINDINGS: Injectable port in stable position. Calcific atherosclerotic disease of the aorta. Cardiomediastinal silhouette is normal. Mediastinal contours appear intact. Bilateral small to moderate pleural effusions with bibasilar atelectasis. Osseous structures are without acute abnormality. Soft tissues are grossly normal. IMPRESSION: Bilateral small to moderate pleural effusions with bibasilar atelectasis. Electronically Signed   By: Fidela Salisbury M.D.   On: 03/30/2019 15:40   Ct Chest Wo Contrast  Result Date: 04/26/2019 CLINICAL DATA:  85 year old presenting with current history of ovarian cancer for which she is undergoing chemotherapy, current history of atrial fibrillation, presenting with chest discomfort. Abnormal chest x-ray earlier today questioning a RIGHT apically pneumothorax and LEFT apically lung nodules. EXAM: CT CHEST WITHOUT CONTRAST TECHNIQUE: Multidetector CT imaging of the chest was performed following the standard protocol without IV contrast. COMPARISON:  No prior chest CT. FINDINGS: Cardiovascular: Heart size upper normal to slightly enlarged. Severe three-vessel coronary atherosclerosis. RIGHT jugular Port-A-Cath tip at the cavoatrial junction. No pericardial effusion. Moderate to severe atherosclerosis involving the thoracic and proximal abdominal aorta without evidence of aneurysm. Mediastinum/Nodes: Numerous normal sized lymph nodes throughout the mediastinum. No pathologic lymphadenopathy. Fluid in the mid portion of the normal appearing esophagus without evidence of hiatal hernia. Mild thyromegaly without nodularity. Lungs/Pleura: No evidence of a RIGHT pneumothorax or LEFT apical nodules as questioned on the earlier chest x-ray. Emphysematous changes in both lungs. No parenchymal nodules or masses to suggest metastatic disease.  Moderately large RIGHT pleural effusion and associated consolidation in the RIGHT LOWER LOBE. Small LEFT pleural effusion and associated mild consolidation in the LEFT LOWER LOBE. Moderate BILATERAL lower lobe and mild BILATERAL upper lobe bronchiectasis. Central airways patent with moderate to marked bronchial wall thickening. Upper Abdomen: Minimal perihepatic ascites. Visualized upper abdomen otherwise unremarkable for the unenhanced technique. Musculoskeletal: Osseous demineralization. Benign bone island in the T11 vertebral body. Degenerative changes and DISH involving the mid and LOWER thoracic spine. No acute findings. IMPRESSION: 1. No evidence of a RIGHT pneumothorax or LEFT apical nodules as questioned on the earlier chest x-ray. 2. Moderately large RIGHT pleural effusion and small LEFT pleural effusion with associated passive atelectasis (favored over pneumonia) in the lower lobes. 3. Moderate to marked central bronchial wall thickening indicating asthma and/or bronchitis. 4. Moderate bilateral lower lobe and mild bilateral upper lobe bronchiectasis. 5. Minimal perihepatic ascites. Aortic Atherosclerosis (ICD10-I70.0) and Emphysema (ICD10-J43.9). Electronically Signed   By: Evangeline Dakin M.D.   On: 04/26/2019 16:19   Dg Chest Port 1 View  Result Date: 04/17/2019 CLINICAL DATA:  85 year old with palpitations. EXAM: PORTABLE CHEST 1 VIEW COMPARISON:  03/30/2019 FINDINGS: Stable position of the right jugular Port-A-Cath. Catheter tip in the lower SVC region. Chronic blunting at the lung bases is unchanged. No focal airspace disease or pulmonary edema. Heart size is stable and within normal limits. Atherosclerotic calcifications at the aortic arch. Bone structures are unremarkable. IMPRESSION: No acute cardiopulmonary disease. Electronically Signed   By: Markus Daft M.D.   On: 04/17/2019 09:51     Discharge Exam: Vitals:   04/27/19 0800 04/27/19 1230  BP:  (!) 134/51  Pulse:  95  Resp:  20   Temp:    SpO2: 97% 98%   Vitals:   04/26/19 2156 04/27/19 0624 04/27/19 0800 04/27/19 1230  BP: 114/64 (!) 115/54  (!) 134/51  Pulse: 83 86  95  Resp: 16 20  20   Temp: 98.5 F (36.9 C) 98.5 F (36.9 C)    TempSrc: Oral Oral    SpO2: 97% 97% 97% 98%  Weight:      Height:        General: Pt is alert, awake, not in acute distress Cardiovascular: RRR, S1/S2 +, no rubs, no gallops Respiratory: CTA bilaterally, no wheezing, no rhonchi Abdominal: Soft, NT, ND, bowel sounds + Extremities: no edema, no cyanosis    The results of significant diagnostics from this hospitalization (including imaging, microbiology, ancillary and laboratory) are listed below for reference.     Microbiology: Recent Results (from the past 240 hour(s))  SARS Coronavirus 2 (CEPHEID - Performed in [place] hospital lab), Hosp Order     Status: None   Collection Time: 04/26/19  4:35 PM   Specimen: Nasopharyngeal Swab  Result Value Ref Range Status   SARS Coronavirus 2 NEGATIVE NEGATIVE Final    Comment: (NOTE) If result is NEGATIVE SARS-CoV-2 target nucleic acids are NOT DETECTED. The SARS-CoV-2 RNA is generally detectable in upper and lower  respiratory specimens during the acute phase of infection. The lowest  concentration of SARS-CoV-2 viral copies this assay can detect is 250  copies / mL. A negative result does not preclude SARS-CoV-2 infection  and should not be used as the sole basis for treatment or other  patient management decisions.  A negative result may occur with  improper specimen collection / handling, submission of specimen other  than nasopharyngeal swab, presence of viral mutation(s) within the  areas targeted by this assay, and inadequate number of viral copies  (<250 copies / mL). A negative result must be combined with clinical  observations, patient history, and epidemiological information. If result is POSITIVE SARS-CoV-2 target nucleic acids are DETECTED. The SARS-CoV-2  RNA is generally detectable in upper and lower  respiratory specimens dur ing the acute phase of infection.  Positive  results are indicative of active infection with SARS-CoV-2.  Clinical  correlation with patient history and other diagnostic information is  necessary to determine patient infection status.  Positive results do  not rule out bacterial infection or co-infection with other viruses. If result is PRESUMPTIVE POSTIVE SARS-CoV-2 nucleic acids MAY BE PRESENT.   A presumptive positive result was obtained on the submitted specimen  and confirmed on repeat testing.  While 2019 novel coronavirus  (SARS-CoV-2) nucleic acids may be present in the submitted sample  additional confirmatory testing may be necessary for epidemiological  and / or clinical management purposes  to differentiate between  SARS-CoV-2 and other Sarbecovirus currently known to infect humans.  If clinically indicated additional testing with an alternate  test  methodology [phone]) is advised. The SARS-CoV-2 RNA is generally  detectable in upper and lower respiratory sp ecimens during the acute  phase of infection. The expected result is Negative. Fact Sheet for Patients:  StrictlyIdeas.no Fact Sheet for Healthcare Providers: BankingDealers.co.za This test is not yet approved or cleared by the [place] FDA and has been authorized for detection and/or diagnosis of SARS-CoV-2 by FDA under an Emergency Use Authorization (EUA).  This EUA will remain in effect (meaning this test can be used) for the duration of the COVID-19 declaration under Section 564(b)(1) of the Act, 21 U.S.C. section 360bbb-3(b)(1), unless the authorization is terminated or revoked sooner. Performed at [facility], [address], [place], [place] [address]      Labs: BNP (last 3 results) Recent Labs    04/17/19 0918  BNP 284.1*   Basic Metabolic Panel: Recent Labs  Lab  04/26/19 1455  NA 133*  K 3.4*  CL 95*  CO2 28  GLUCOSE 127*  BUN 21  CREATININE 1.35*  CALCIUM 8.4*   Liver Function Tests: No results for input(s): AST, ALT, ALKPHOS, BILITOT, PROT, ALBUMIN in the last 168 hours. No results for input(s): LIPASE, AMYLASE in the last 168 hours. No results for input(s): AMMONIA in the last 168 hours. CBC: Recent Labs  Lab 04/26/19 1455 04/26/19 2316 04/27/19 0539  WBC 7.2  --  8.5  NEUTROABS 4.8  --   --   HGB 6.6* 8.2* 8.6*  HCT 22.7* 26.8* 28.2*  MCV 80.5  --  79.9*  PLT 224  --  273   Cardiac Enzymes: No results for input(s): CKTOTAL, CKMB, CKMBINDEX, TROPONINI in the last 168 hours. BNP: Invalid input(s): POCBNP CBG: No results for input(s): GLUCAP in the last 168 hours. D-Dimer No results for input(s): DDIMER in the last 72 hours. Hgb A1c No results for input(s): HGBA1C in the last 72 hours. Lipid Profile No results for input(s): CHOL, HDL, LDLCALC, TRIG, CHOLHDL, LDLDIRECT in the last 72 hours. Thyroid function studies No results for input(s): TSH, T4TOTAL, T3FREE, THYROIDAB in the last 72 hours.  Invalid input(s): FREET3 Anemia work up No results for input(s): VITAMINB12, FOLATE, FERRITIN, TIBC, IRON, RETICCTPCT in the last 72 hours. Urinalysis    Component Value Date/Time   COLORURINE YELLOW 04/26/2019 1729   APPEARANCEUR HAZY (A) 04/26/2019 1729   LABSPEC 1.009 04/26/2019 1729   LABSPEC 1.030 10/11/2017 1125   PHURINE 6.0 04/26/2019 1729   GLUCOSEU NEGATIVE 04/26/2019 1729   GLUCOSEU Negative 10/11/2017 1125   HGBUR MODERATE (A) 04/26/2019 1729   BILIRUBINUR NEGATIVE 04/26/2019 1729   BILIRUBINUR Negative 10/11/2017 1125   KETONESUR NEGATIVE 04/26/2019 1729   PROTEINUR NEGATIVE 04/26/2019 1729   UROBILINOGEN 0.2 10/11/2017 1125   NITRITE NEGATIVE 04/26/2019 1729   LEUKOCYTESUR MODERATE (A) 04/26/2019 1729   LEUKOCYTESUR Negative 10/11/2017 1125   Sepsis Labs Invalid input(s): PROCALCITONIN,  WBC,   LACTICIDVEN Microbiology Recent Results (from the past 240 hour(s))  SARS Coronavirus 2 (CEPHEID - Performed in [place] hospital lab), Hosp Order     Status: None   Collection Time: 04/26/19  4:35 PM   Specimen: Nasopharyngeal Swab  Result Value Ref Range Status   SARS Coronavirus 2 NEGATIVE NEGATIVE Final    Comment: (NOTE) If result is NEGATIVE SARS-CoV-2 target nucleic acids are NOT DETECTED. The SARS-CoV-2 RNA is generally detectable in upper and lower  respiratory specimens during the acute phase of infection. The lowest  concentration of SARS-CoV-2 viral copies this assay  can detect is 250  copies / mL. A negative result does not preclude SARS-CoV-2 infection  and should not be used as the sole basis for treatment or other  patient management decisions.  A negative result may occur with  improper specimen collection / handling, submission of specimen other  than nasopharyngeal swab, presence of viral mutation(s) within the  areas targeted by this assay, and inadequate number of viral copies  (<250 copies / mL). A negative result must be combined with clinical  observations, patient history, and epidemiological information. If result is POSITIVE SARS-CoV-2 target nucleic acids are DETECTED. The SARS-CoV-2 RNA is generally detectable in upper and lower  respiratory specimens dur ing the acute phase of infection.  Positive  results are indicative of active infection with SARS-CoV-2.  Clinical  correlation with patient history and other diagnostic information is  necessary to determine patient infection status.  Positive results do  not rule out bacterial infection or co-infection with other viruses. If result is PRESUMPTIVE POSTIVE SARS-CoV-2 nucleic acids MAY BE PRESENT.   A presumptive positive result was obtained on the submitted specimen  and confirmed on repeat testing.  While 2019 novel coronavirus  (SARS-CoV-2) nucleic acids may be present in the submitted sample   additional confirmatory testing may be necessary for epidemiological  and / or clinical management purposes  to differentiate between  SARS-CoV-2 and other Sarbecovirus currently known to infect humans.  If clinically indicated additional testing with an alternate test  methodology [phone]) is advised. The SARS-CoV-2 RNA is generally  detectable in upper and lower respiratory sp ecimens during the acute  phase of infection. The expected result is Negative. Fact Sheet for Patients:  StrictlyIdeas.no Fact Sheet for Healthcare Providers: BankingDealers.co.za This test is not yet approved or cleared by the [place] FDA and has been authorized for detection and/or diagnosis of SARS-CoV-2 by FDA under an Emergency Use Authorization (EUA).  This EUA will remain in effect (meaning this test can be used) for the duration of the COVID-19 declaration under Section 564(b)(1) of the Act, 21 U.S.C. section 360bbb-3(b)(1), unless the authorization is terminated or revoked sooner. Performed at [facility], [address], [place], [place] [address]      Time coordinating discharge: 35 minutes  SIGNED:   Rodena Goldmann, DO [facility] 04/27/2019, 3:05 PM  If 7PM-7AM, please contact night-coverage [email or web address] Password [email or web address]

## 2019-04-27 NOTE — Progress Notes (Signed)
PROGRESS NOTE    [name]  RAQ:[id number] DOB: 06/21/1933 DOA: 04/26/2019 PCP: Asencion Noble, MD   Brief Narrative:  Per HPI: [name] is a 85 y.o. female with medical history significant for COPD, atrial fibrillation, diastolic CHF, hypothyroidism, psoriatic arthritis, left ovarian cancer, peripheral artery disease, who presented to the ED with complaints of " her heart not feeling right".  She reports her heart rate would drop down to 30s of 40s feeling at home.  She denies abdominal pain.  No vomiting.  No bloody stools or black stools.  She is compliant with her Xarelto, her last dose was last night. Patient reports chronic difficulty breathing from COPD that is unchanged, no chest pain, no fevers or chills.  Patient was admitted for symptomatic acute on chronic anemia and is status post 1 unit PRBC transfusion and is overall feeling much better.  She is FOBT positive and takes Xarelto at home.  Assessment & Plan:   Active Problems:   Symptomatic anemia   Acute on chronic symptomatic anemia- hemoglobin 6.6 to 8.6 on 7/20.  Stool FOBT positive.  Denies melena hematemesis or hematochezia.  On anticoagulation with Xarelto for atrial fibrillation.  Last dose last night.  Stable vitals.  Colonoscopy 2011, showed diverticulosis.  Last EGD 2013- Schatzki ring in distal esophagus, small hiatal hernia.  Otherwise unremarkable. 1U PRBC transfused in ED -Appreciate GI evaluation with full liquid diet started and possible endoscopy in a.m. -Continue to monitor repeat CBC with improvement noted -Continue to hold Xarelto  Mild electrolyte abnormalities-hyponatremia, 133, hypokalemia 3.4.  From diuretics.  Baseline sodium 134-136. -Replete K ordered -Recheck levels in a.m. along with magnesium  Atrial fibrillation-rate controlled and anticoagulated with Xarelto.  Rates 50s to 68s.  Patient reports briefly dropped to 30s to 40s at home.  Blood pressure systolic 456-256L.  Follows with  Dr. Conni Elliot. With complaints of tachycardia heart rate up to 140s at home, metoprolol 25 mg was started as needed. -Continue home Cardizem 180 twice daily per recent note -Hold home PRN metoprolol 63m  Ovarian cancer-currently on chemotherapy.  Follows with Dr. GAlvy Bimler -Continue home narcotics, been to  Psoriatic arthritis-appears stable -Continue home narcotics  COPD-no wheezing, or change in respiratory symptoms.  Chest CT shows moderately large right pleural effusion, small left pleural effusion, associated passive atelectasis, bronchitis and bronchiectasis. -Continue home bronchodilators -PRN albuterol -Continue 2 L nasal cannula  Diastolic CHF- appears stable.  CT shows pleural effusion.  This appears chronic.  Seen on prior x-ray 03/30/19.  Last echo 08/2017, EF 60 to 65%, G1 DD. -Continue home Lasix  Depression-  -Continue home Cymbalta, mirtazapine, nightly PRN Xanax.   DVT prophylaxis: SCDs Code Status: Full Family Communication: Daughter at bedside Disposition Plan: Per GI with possible endoscopy in a.m.  Continue full liquid diet through today.  Monitor repeat CBC.   Consultants:   GI  Procedures:   None  Antimicrobials:   None   Subjective: Patient seen and evaluated today with no new acute complaints or concerns. No acute concerns or events noted overnight.  She is overall starting to feel some better after her blood transfusion.  She denies any abdominal pain or other complaints currently.  Objective: Vitals:   04/26/19 2136 04/26/19 2156 04/27/19 0624 04/27/19 0800  BP:  114/64 (!) 115/54   Pulse:  83 86   Resp:  16 20   Temp:  98.5 F (36.9 C) 98.5 F (36.9 C)   TempSrc:  Oral Oral  SpO2: 94% 97% 97% 97%  Weight:      Height:        Intake/Output Summary (Last 24 hours) at 04/27/2019 1032 Last data filed at 04/27/2019 0305 Gross per 24 hour  Intake 315 ml  Output 900 ml  Net -585 ml   Filed Weights   04/26/19 1410  Weight:  77.1 kg    Examination:  General exam: Appears calm and comfortable  Respiratory system: Clear to auscultation. Respiratory effort normal.  Currently on 2 L nasal cannula. Cardiovascular system: S1 & S2 heard, RRR. No JVD, murmurs, rubs, gallops or clicks. No pedal edema. Gastrointestinal system: Abdomen is nondistended, soft and nontender. No organomegaly or masses felt. Normal bowel sounds heard. Central nervous system: Alert and oriented. No focal neurological deficits. Extremities: Symmetric 5 x 5 power. Skin: No rashes, lesions or ulcers Psychiatry: Judgement and insight appear normal. Mood & affect appropriate.     Data Reviewed: I have personally reviewed following labs and imaging studies  CBC: Recent Labs  Lab 04/26/19 1455 04/26/19 2316 04/27/19 0539  WBC 7.2  --  8.5  NEUTROABS 4.8  --   --   HGB 6.6* 8.2* 8.6*  HCT 22.7* 26.8* 28.2*  MCV 80.5  --  79.9*  PLT 224  --  371   Basic Metabolic Panel: Recent Labs  Lab 04/26/19 1455  NA 133*  K 3.4*  CL 95*  CO2 28  GLUCOSE 127*  BUN 21  CREATININE 1.35*  CALCIUM 8.4*   GFR: Estimated Creatinine Clearance: 31.8 mL/min (A) (by C-G formula based on SCr of 1.35 mg/dL (H)). Liver Function Tests: No results for input(s): AST, ALT, ALKPHOS, BILITOT, PROT, ALBUMIN in the last 168 hours. No results for input(s): LIPASE, AMYLASE in the last 168 hours. No results for input(s): AMMONIA in the last 168 hours. Coagulation Profile: No results for input(s): INR, PROTIME in the last 168 hours. Cardiac Enzymes: No results for input(s): CKTOTAL, CKMB, CKMBINDEX, TROPONINI in the last 168 hours. BNP (last 3 results) No results for input(s): PROBNP in the last 8760 hours. HbA1C: No results for input(s): HGBA1C in the last 72 hours. CBG: No results for input(s): GLUCAP in the last 168 hours. Lipid Profile: No results for input(s): CHOL, HDL, LDLCALC, TRIG, CHOLHDL, LDLDIRECT in the last 72 hours. Thyroid Function  Tests: No results for input(s): TSH, T4TOTAL, FREET4, T3FREE, THYROIDAB in the last 72 hours. Anemia Panel: No results for input(s): VITAMINB12, FOLATE, FERRITIN, TIBC, IRON, RETICCTPCT in the last 72 hours. Sepsis Labs: Recent Labs  Lab 04/26/19 1455  LATICACIDVEN 1.5    Recent Results (from the past 240 hour(s))  SARS Coronavirus 2 (CEPHEID - Performed in [facility] hospital lab), Hosp Order     Status: None   Collection Time: 04/26/19  4:35 PM   Specimen: Nasopharyngeal Swab  Result Value Ref Range Status   SARS Coronavirus 2 NEGATIVE NEGATIVE Final    Comment: (NOTE) If result is NEGATIVE SARS-CoV-2 target nucleic acids are NOT DETECTED. The SARS-CoV-2 RNA is generally detectable in upper and lower  respiratory specimens during the acute phase of infection. The lowest  concentration of SARS-CoV-2 viral copies this assay can detect is 250  copies / mL. A negative result does not preclude SARS-CoV-2 infection  and should not be used as the sole basis for treatment or other  patient management decisions.  A negative result may occur with  improper specimen collection / handling, submission of specimen other  than nasopharyngeal swab,  presence of viral mutation(s) within the  areas targeted by this assay, and inadequate number of viral copies  (<250 copies / mL). A negative result must be combined with clinical  observations, patient history, and epidemiological information. If result is POSITIVE SARS-CoV-2 target nucleic acids are DETECTED. The SARS-CoV-2 RNA is generally detectable in upper and lower  respiratory specimens dur ing the acute phase of infection.  Positive  results are indicative of active infection with SARS-CoV-2.  Clinical  correlation with patient history and other diagnostic information is  necessary to determine patient infection status.  Positive results do  not rule out bacterial infection or co-infection with other viruses. If result is PRESUMPTIVE  POSTIVE SARS-CoV-2 nucleic acids MAY BE PRESENT.   A presumptive positive result was obtained on the submitted specimen  and confirmed on repeat testing.  While 2019 novel coronavirus  (SARS-CoV-2) nucleic acids may be present in the submitted sample  additional confirmatory testing may be necessary for epidemiological  and / or clinical management purposes  to differentiate between  SARS-CoV-2 and other Sarbecovirus currently known to infect humans.  If clinically indicated additional testing with an alternate test  methodology [phone]) is advised. The SARS-CoV-2 RNA is generally  detectable in upper and lower respiratory sp ecimens during the acute  phase of infection. The expected result is Negative. Fact Sheet for Patients:  StrictlyIdeas.no Fact Sheet for Healthcare Providers: BankingDealers.co.za This test is not yet approved or cleared by the [place] FDA and has been authorized for detection and/or diagnosis of SARS-CoV-2 by FDA under an Emergency Use Authorization (EUA).  This EUA will remain in effect (meaning this test can be used) for the duration of the COVID-19 declaration under Section 564(b)(1) of the Act, 21 U.S.C. section 360bbb-3(b)(1), unless the authorization is terminated or revoked sooner. Performed at [facility], [address] [place], [place] [address]          Radiology Studies: Dg Chest 2 View  Result Date: 04/26/2019 CLINICAL DATA:  Chest discomfort, hx of a-fib, low O2 sat at home- pt undergoing chemo for ovarian cancer EXAM: CHEST - 2 VIEW COMPARISON:  04/17/2019 FINDINGS: Power injectable right IJ Port-A-Cath tip: SVC. Atherosclerotic calcification of the aortic arch. Heart size within normal limits. Blunting of both costophrenic angles compatible with pleural effusions. Subtle subpleural lucency along the right lung apex; although this may be from a skin wrinkle, I cannot completely exclude a  small apical pneumothorax. New nodularity in the left lung apex including a 1.3 cm pulmonary nodule projecting of the left first rib and perhaps another adjacent nodule. Thoracic spondylosis. Linear subsegmental atelectasis at the left lung base. IMPRESSION: 1. I cannot exclude a tiny right apical pneumothorax. Also, there are potential new pulmonary nodules at the left lung apex. Chest CT is recommended for further characterization. 2. Small bilateral pleural effusions. 3.  Aortic Atherosclerosis (ICD10-I70.0). Electronically Signed   By: Van Clines M.D.   On: 04/26/2019 15:22   Ct Chest Wo Contrast  Result Date: 04/26/2019 CLINICAL DATA:  85 year old presenting with current history of ovarian cancer for which she is undergoing chemotherapy, current history of atrial fibrillation, presenting with chest discomfort. Abnormal chest x-ray earlier today questioning a RIGHT apically pneumothorax and LEFT apically lung nodules. EXAM: CT CHEST WITHOUT CONTRAST TECHNIQUE: Multidetector CT imaging of the chest was performed following the standard protocol without IV contrast. COMPARISON:  No prior chest CT. FINDINGS: Cardiovascular: Heart size upper normal to slightly enlarged. Severe three-vessel coronary atherosclerosis. RIGHT  jugular Port-A-Cath tip at the cavoatrial junction. No pericardial effusion. Moderate to severe atherosclerosis involving the thoracic and proximal abdominal aorta without evidence of aneurysm. Mediastinum/Nodes: Numerous normal sized lymph nodes throughout the mediastinum. No pathologic lymphadenopathy. Fluid in the mid portion of the normal appearing esophagus without evidence of hiatal hernia. Mild thyromegaly without nodularity. Lungs/Pleura: No evidence of a RIGHT pneumothorax or LEFT apical nodules as questioned on the earlier chest x-ray. Emphysematous changes in both lungs. No parenchymal nodules or masses to suggest metastatic disease. Moderately large RIGHT pleural effusion and  associated consolidation in the RIGHT LOWER LOBE. Small LEFT pleural effusion and associated mild consolidation in the LEFT LOWER LOBE. Moderate BILATERAL lower lobe and mild BILATERAL upper lobe bronchiectasis. Central airways patent with moderate to marked bronchial wall thickening. Upper Abdomen: Minimal perihepatic ascites. Visualized upper abdomen otherwise unremarkable for the unenhanced technique. Musculoskeletal: Osseous demineralization. Benign bone island in the T11 vertebral body. Degenerative changes and DISH involving the mid and LOWER thoracic spine. No acute findings. IMPRESSION: 1. No evidence of a RIGHT pneumothorax or LEFT apical nodules as questioned on the earlier chest x-ray. 2. Moderately large RIGHT pleural effusion and small LEFT pleural effusion with associated passive atelectasis (favored over pneumonia) in the lower lobes. 3. Moderate to marked central bronchial wall thickening indicating asthma and/or bronchitis. 4. Moderate bilateral lower lobe and mild bilateral upper lobe bronchiectasis. 5. Minimal perihepatic ascites. Aortic Atherosclerosis (ICD10-I70.0) and Emphysema (ICD10-J43.9). Electronically Signed   By: Evangeline Dakin M.D.   On: 04/26/2019 16:19        Scheduled Meds:  sodium chloride   Intravenous Once   dicyclomine  10 mg Oral BID   diltiazem  180 mg Oral BID   DULoxetine  60 mg Oral QHS   furosemide  40 mg Oral Daily   levothyroxine  137 mcg Oral QAC breakfast   mirtazapine  15 mg Oral QHS   mometasone-formoterol  2 puff Inhalation BID   pantoprazole (PROTONIX) IV  40 mg Intravenous Q24H   potassium chloride SA  20 mEq Oral Daily   Continuous Infusions:   LOS: 0 days    Time spent: 30 minutes    Adalberto Metzgar Darleen Crocker, DO [facility] Pager [phone]  If 7PM-7AM, please contact night-coverage [email or web address] Password [email or web address] 04/27/2019, 10:32 AM

## 2019-04-27 NOTE — Plan of Care (Addendum)
REVIEWED EMR 2011 TO PRESENT.HOLD XARELTO. PT ADMITTED 2ND TIME WITHIN THE LAST YEAR FOR ANEMIA/OBSCURE AND OVERT GI BLEED but most likely DUE TO CHEMO. DEFER XARELTO MANAGEMENT TO CARDIOLOGY. [place] OF CONTINUED XARELTO.

## 2019-04-27 NOTE — Care Management CC44 (Signed)
Condition Code 44 Documentation Completed  Patient Details  Name: [name] MRN: [id number] Date of Birth: 02-Aug-1933   Condition Code 44 given:  Yes Patient signature on Condition Code 44 notice:  Yes Documentation of 2 MD's agreement:  Yes Code 44 added to claim:  Yes    Shadaya Marschner, Chauncey Reading, RN 04/27/2019, 4:18 PM

## 2019-04-28 ENCOUNTER — Telehealth: Payer: Self-pay | Admitting: Hematology and Oncology

## 2019-04-28 ENCOUNTER — Telehealth: Payer: Self-pay | Admitting: Cardiology

## 2019-04-28 MED ORDER — DILTIAZEM HCL ER COATED BEADS 180 MG PO CP24: 180.0000 mg | ORAL_CAPSULE | Freq: Two times a day (BID) | ORAL | 3 refills | Status: DC

## 2019-04-28 NOTE — Telephone Encounter (Signed)
I reviewed the chart.  I do not see any documentation from Dr. Harl Bowie regarding a dose change.  She saw [name] on July 13 at which point Cardizem CD was 180 mg twice daily.  Her recent brief hospital stay with symptomatic anemia notes some discussion per patient that she had her heart rate dropped briefly to the "30s to 40s" at home, although I am not sure how this was documented.  The discharge summary indicates Cardizem CD 180 mg once daily.  Heart rate recorded during hospital stay was reportedly 50s to 80s however.  I think what I would suggest at this point is to continue Cardizem CD 180 mg twice daily and place a 72-hour ZIO patch to better assess true heart rate variability.  She has known breakthrough rapid atrial fibrillation, and she may in fact need other medication adjustments as this evolves.

## 2019-04-28 NOTE — Telephone Encounter (Signed)
Called pts daughter and left message per 7/21 sch message. appts scheduled for this week.  Let Rn know I was not able to get in touch and left a message.

## 2019-04-28 NOTE — Telephone Encounter (Signed)
Patient daughter also wants an earlier appointment for her - Gave her first available with PA Ermalinda Barrios, I told her nurse would advise if that needs to be changed

## 2019-04-28 NOTE — Telephone Encounter (Signed)
New Message   Daughter [name] calling:  Should patient take her   diltiazem (CARDIZEM CD) 180 MG 24 hr capsule Take 1 capsule (180 mg total) by mouth at bedtime.   She was taking it twice a day and when patient went to Er Dr Harl Bowie told her to only take it at night, she wants to confirm with Dr Domenic Polite that this is an acceptable change.

## 2019-04-28 NOTE — Telephone Encounter (Signed)
Daughter will change cardizem to BID from qd  I have enrolled pt with ZIO, daughter aware

## 2019-04-28 NOTE — Telephone Encounter (Signed)
Patient's daughter would like to speak with nurse regarding medication changes from ED visit and admission to AP/tg

## 2019-04-28 NOTE — Telephone Encounter (Signed)
Spoke with [name].  Gave below message.  She is available this Thursday 7/23.  High Priority schedule msg sent to Melissa X. To call [name] to schedule appts.

## 2019-04-28 NOTE — Telephone Encounter (Signed)
-----   Message from Heath Lark, MD sent at 04/28/2019  9:07 AM EDT ----- Regarding: call daughter [name] She missed her treatment yesterday due to hospitalization, just DC I can see her and reschedule chemo on Thursday but call daughter first because she is the one who has to get her here We need labs/flush, see me and 5 hours blood/chemo  If she cannot make it on Thursday, then we can do next week

## 2019-04-28 NOTE — Telephone Encounter (Signed)
I will forward to Dr Domenic Polite regarding cardizem dose of daily vs BID dose

## 2019-04-30 ENCOUNTER — Inpatient Hospital Stay (HOSPITAL_BASED_OUTPATIENT_CLINIC_OR_DEPARTMENT_OTHER): Payer: Medicare Other | Admitting: Hematology and Oncology

## 2019-04-30 ENCOUNTER — Inpatient Hospital Stay: Payer: Medicare Other

## 2019-04-30 ENCOUNTER — Telehealth: Payer: Self-pay | Admitting: Hematology and Oncology

## 2019-04-30 ENCOUNTER — Other Ambulatory Visit: Payer: Self-pay

## 2019-04-30 ENCOUNTER — Ambulatory Visit: Payer: Self-pay | Admitting: Emergency Medicine

## 2019-04-30 ENCOUNTER — Encounter: Payer: Self-pay | Admitting: Hematology and Oncology

## 2019-04-30 DIAGNOSIS — D638 Anemia in other chronic diseases classified elsewhere: Secondary | ICD-10-CM

## 2019-04-30 DIAGNOSIS — R5381 Other malaise: Secondary | ICD-10-CM

## 2019-04-30 DIAGNOSIS — C562 Malignant neoplasm of left ovary: Secondary | ICD-10-CM

## 2019-04-30 DIAGNOSIS — R531 Weakness: Secondary | ICD-10-CM

## 2019-04-30 DIAGNOSIS — Z79899 Other long term (current) drug therapy: Secondary | ICD-10-CM

## 2019-04-30 DIAGNOSIS — G893 Neoplasm related pain (acute) (chronic): Secondary | ICD-10-CM

## 2019-04-30 DIAGNOSIS — M542 Cervicalgia: Secondary | ICD-10-CM

## 2019-04-30 DIAGNOSIS — I700 Atherosclerosis of aorta: Secondary | ICD-10-CM

## 2019-04-30 DIAGNOSIS — Z7901 Long term (current) use of anticoagulants: Secondary | ICD-10-CM

## 2019-04-30 DIAGNOSIS — R188 Other ascites: Secondary | ICD-10-CM

## 2019-04-30 DIAGNOSIS — J439 Emphysema, unspecified: Secondary | ICD-10-CM

## 2019-04-30 DIAGNOSIS — Z5111 Encounter for antineoplastic chemotherapy: Secondary | ICD-10-CM | POA: Diagnosis not present

## 2019-04-30 DIAGNOSIS — J90 Pleural effusion, not elsewhere classified: Secondary | ICD-10-CM

## 2019-04-30 DIAGNOSIS — R002 Palpitations: Secondary | ICD-10-CM

## 2019-04-30 LAB — CMP (CANCER CENTER ONLY)
ALT: 9 U/L (ref 0–44)
AST: 15 U/L (ref 15–41)
Albumin: 3.1 g/dL — ABNORMAL LOW (ref 3.5–5.0)
Alkaline Phosphatase: 68 U/L (ref 38–126)
Anion gap: 7 (ref 5–15)
BUN: 17 mg/dL (ref 8–23)
CO2: 31 mmol/L (ref 22–32)
Calcium: 9.1 mg/dL (ref 8.9–10.3)
Chloride: 98 mmol/L (ref 98–111)
Creatinine: 1.17 mg/dL — ABNORMAL HIGH (ref 0.44–1.00)
GFR, Est AFR Am: 50 mL/min — ABNORMAL LOW (ref >60)
GFR, Estimated: 43 mL/min — ABNORMAL LOW (ref >60)
Glucose, Bld: 109 mg/dL — ABNORMAL HIGH (ref 70–99)
Potassium: 3.9 mmol/L (ref 3.5–5.1)
Sodium: 136 mmol/L (ref 135–145)
Total Bilirubin: 0.3 mg/dL (ref 0.3–1.2)
Total Protein: 7 g/dL (ref 6.5–8.1)

## 2019-04-30 LAB — BPAM RBC
Blood Product Expiration Date: 202008072359
Blood Product Expiration Date: 202008082359
ISSUE DATE / TIME: 202007191806
Unit Type and Rh: 5100
Unit Type and Rh: 5100

## 2019-04-30 LAB — CBC WITH DIFFERENTIAL/PLATELET
Abs Immature Granulocytes: 0.1 K/uL — ABNORMAL HIGH (ref 0.00–0.07)
Basophils Absolute: 0 K/uL (ref 0.0–0.1)
Basophils Relative: 1 %
Eosinophils Absolute: 0.3 K/uL (ref 0.0–0.5)
Eosinophils Relative: 4 %
HCT: 28.8 % — ABNORMAL LOW (ref 36.0–46.0)
Hemoglobin: 8.6 g/dL — ABNORMAL LOW (ref 12.0–15.0)
Immature Granulocytes: 1 %
Lymphocytes Relative: 19 %
Lymphs Abs: 1.4 K/uL (ref 0.7–4.0)
MCH: 24.5 pg — ABNORMAL LOW (ref 26.0–34.0)
MCHC: 29.9 g/dL — ABNORMAL LOW (ref 30.0–36.0)
MCV: 82.1 fL (ref 80.0–100.0)
Monocytes Absolute: 1.1 K/uL — ABNORMAL HIGH (ref 0.1–1.0)
Monocytes Relative: 16 %
Neutro Abs: 4.3 K/uL (ref 1.7–7.7)
Neutrophils Relative %: 59 %
Platelets: 253 K/uL (ref 150–400)
RBC: 3.51 MIL/uL — ABNORMAL LOW (ref 3.87–5.11)
RDW: 18.1 % — ABNORMAL HIGH (ref 11.5–15.5)
WBC: 7.2 K/uL (ref 4.0–10.5)
nRBC: 0 % (ref 0.0–0.2)

## 2019-04-30 LAB — TYPE AND SCREEN
ABO/RH(D): O POS
Antibody Screen: NEGATIVE
Unit division: 0
Unit division: 0

## 2019-04-30 LAB — SAMPLE TO BLOOD BANK

## 2019-04-30 MED ORDER — OXYCODONE HCL 15 MG PO TABS: 15.0000 mg | ORAL_TABLET | ORAL | 0 refills | Status: DC | PRN

## 2019-04-30 MED ORDER — SODIUM CHLORIDE 0.9% FLUSH
10.0000 mL | Freq: Once | INTRAVENOUS | Status: AC
  Administered 2019-04-30: 10 mL
  Filled 2019-04-30: qty 10

## 2019-04-30 NOTE — Progress Notes (Signed)
PT wanted to stay accessed for treatment on 7/24.

## 2019-04-30 NOTE — Assessment & Plan Note (Signed)
Patient is debilitated and weak I tried to reduce the frequency of traveling back and forth and offer her treatment today However, she has an appointment at home and will not be able to stay to wait for her treatment today She will return tomorrow for chemotherapy The patient would like to switch her treatment back to early part of the week After several discussions, I will get her scheduled to return on August 11 for her next cycle of therapy

## 2019-04-30 NOTE — Patient Instructions (Signed)

## 2019-04-30 NOTE — Assessment & Plan Note (Signed)
She was recently transfused Her hemoglobin is improved She does not need blood today She will only proceed with chemo We will give her blood if hemoglobin is less than 8

## 2019-04-30 NOTE — Assessment & Plan Note (Signed)
She has multifactorial pain, chronic neck pain reasonably controlled with oxycodone I recommend Tylenol as needed in additional to prescribed oxycodone

## 2019-04-30 NOTE — Telephone Encounter (Signed)
I talk with patient regarding schedule  

## 2019-04-30 NOTE — Progress Notes (Signed)
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[facility]) She continues to tolerate treatment very poorly with complications such as anemia, pain, neuropathy and recurrent infection/hospitalization Her blood count is stable today Despite progression on multiple lines of treatment, she wants to continue chemotherapy Her recent tumor marker is mildly improved I recommend minimum 6 doses of chemo before repeat imaging study, due probably end of August  Anemia, chronic disease She was recently transfused Her hemoglobin is improved She does not need blood today She will only proceed with chemo We will give her blood if hemoglobin is less than 8  Cancer associated pain She has multifactorial pain, chronic neck pain reasonably controlled with oxycodone I recommend Tylenol as needed in additional to prescribed oxycodone  Generalized weakness Patient is debilitated and weak I tried to reduce the frequency of traveling back and forth and offer her treatment today However, she has an appointment at home and will not be able to stay to wait for her treatment today She will return tomorrow for chemotherapy The patient would like to switch her treatment back to early part of the week After several discussions, I will get her scheduled to return on August 11 for her next cycle of therapy   No orders of the defined types were placed in this encounter.   INTERVAL HISTORY: Please see below for problem oriented charting. She is seen today due to recent missed treatment She was hospitalized due to profound fatigue and  severe anemia She was transfused and felt better She continues to have chronic neck pain She denies recent cough, chest pain or shortness of breath She denies nausea or recent changes in bowel habits No worsening peripheral neuropathy I have also discussed the plan of care with her daughter over the telephone.  Her daughter appears to be upset due to inability to come to the appointment with the patient  SUMMARY OF ONCOLOGIC HISTORY: Oncology History Overview Note  High grade serous ER 90%, PR 0% BRCA 1: no loss of expression MMR normal  R1 resection, platinum refractory, progressed on Femara, carboplatin and Gemzar   Left ovarian epithelial cancer ([place])  02/18/2016 Tumor Marker   Patient's tumor was tested for the following markers: CA125 Results of the tumor marker test revealed 45   05/22/2016 Tumor Marker   Patient's tumor was tested for the following markers: CA125 Results of the tumor marker test revealed 53   05/22/2016 Imaging   Outside pelvic US showed 4.1 cm adnexa mass   06/24/2017 Imaging   Ct abdomen and pelvis:  1. Interim finding of moderate ascites within the abdomen and pelvis with additional finding of diffuse nodular infiltration of the omentum and anterior mesenteric fat, the appearance would be consistent with peritoneal carcinomatosis/metastatic disease. Increasing retroperitoneal and upper abdominal adenopathy. 2. Re- demonstrated 3.8 cm cyst in the right adnexa. Enlarging soft tissue density in the left adnexa now with possible cystic component posteriorly. In light of the above findings, concern is for ovarian neoplasm. Correlation with pelvic ultrasound recommended. 3. Small right-sided pleural effusion, new since prior study 4. Stable hypodense splenic lesions since 2017.    06/25/2017 Imaging   US pelvis:  2.9 cm simple appearing cyst in the right ovary. Left ovary grossly unremarkable. Large volume ascites in the pelvis   06/30/2017 - 07/01/2017 Hospital  Admission   She was admitted for evaluation of abdominal pain and ascites   07/01/2017 Pathology Results   PERITONEAL/ASCITIC FLUID(SPECIMEN 1 OF 1 COLLECTED 07/01/17): - POORLY DIFFERENTIATED CARCINOMA; SEE COMMENT Source Peritoneal/Ascitic Fluid, (specimen 1 of 1 collected 07/01/17) Gross Specimen: Received is/are 1000 cc's of brownish fluid. (BS:bs) Prepared: # Smears: 0 # Concentration Technique Slides (i.e. ThinPrep): 1 # Cell Block: 1 Additional Studies: Also received Hematology slide - [id number]. Comment The tumor cells are positive for cytokeratin 7 and Pax-8 but negative for cytokeratin 20, CDX-2, GATA-3, Napsin-A and TTF-1. Based on the immunoprofile a gynecology primary is favored   07/01/2017 Procedure   Successful ultrasound-guided diagnostic and therapeutic paracentesis yielding 2.5 liters of peritoneal fluid   07/07/2017 - 07/09/2017 Hospital Admission   She was admitted for management of malignant ascites   07/08/2017 Procedure   Successful ultrasound-guided therapeutic paracentesis yielding 2.7 liters liters of peritoneal fluid   07/12/2017 Procedure   Successful ultrasound-guided paracentesis yielding 1450 mL of peritoneal fluid   07/18/2017 - 07/24/2017 Hospital Admission   She was admitted for expedited treatment   07/18/2017 Tumor Marker   Patient's tumor was tested for the following markers: CA125 Results of the tumor marker test revealed 1941   07/19/2017 - 02/04/2018 Chemotherapy   The patient had 6 cycles of carboplatin & Taxol for chemotherapy treatment, followed by 3 more cycles of carboplatin only    07/19/2017 - 02/04/2018 Chemotherapy   The patient had carboplatin and taxol   08/06/2017 Procedure   Successful ultrasound-guided therapeutic paracentesis yielding 2.6 liters of peritoneal fluid.   08/09/2017 Tumor Marker   Patient's tumor was tested for the following markers: CA125 Results of the tumor marker test revealed 1665   08/15/2017 Tumor Marker    Patient's tumor was tested for the following markers: CA125 Results of the tumor marker test revealed 937.9   08/20/2017 Imaging   ECHO: Normal LV size with EF 60-65%. Normal RV size and systolic function. No significant valvular abnormalities.   09/18/2017 Imaging   Chest Impression:  1. No evidence thoracic metastasis. 2. Interval increase and RIGHT pleural effusion.  Abdomen / Pelvis Impression:  1. Interval decrease in intraperitoneal free fluid. 2. Interval decrease in omental nodularity in the LEFT ventral peritoneal space. 3. Interval decrease in nodularity associated with the LEFT ovary. 4. Cystic portion of the RIGHT ovary is increased mildly in size.   09/20/2017 Tumor Marker   Patient's tumor was tested for the following markers: CA125 Results of the tumor marker test revealed 347   10/14/2017 Tumor Marker   Patient's tumor was tested for the following markers: CA125 Results of the tumor marker test revealed 307.4   11/04/2017 Tumor Marker   Patient's tumor was tested for the following markers: CA125 Results of the tumor marker test revealed 262.5   11/28/2017 Imaging   1. Interval decrease in right pleural effusion with resolution of right atelectasis seen previously. 2. New small left pleural effusion, symmetric to the right. 3. No intraperitoneal free fluid on the current study. 4. Continued further decrease in left omental disease, appearing less confluent today than on the prior study. 5. Left ovary remains normal in appearance today and the right adnexal cystic lesion is decreased in size compared to prior study. 6. 14 mm pancreatic cyst is unchanged. Continued attention on follow-up imaging recommended. 7.  Aortic Atherosclerois (ICD10-170.0)   12/13/2017 Tumor Marker   Patient's tumor was tested for the following markers: CA125 Results of the tumor marker test revealed 197.7   01/03/2018 Tumor Marker   Patient's tumor was tested for the following markers:  CA125 Results of the tumor marker test revealed 183.1   01/14/2018 Tumor Marker   Patient's tumor was tested for the following markers: CA125 Results of the tumor marker test revealed 177.4   02/04/2018 Tumor Marker   Patient's tumor was tested for the following markers: CA125 Results of the tumor marker test revealed 168.5   02/25/2018 Imaging   1. Omental carcinomatosis appears qualitatively stable to slightly decreased. Stable mild peritoneal thickening in the paracolic gutters. Stable right adnexal cyst. No ascites. No new or progressive metastatic disease in the abdomen or pelvis. 2. Small dependent right pleural effusion is increased. 3. Cystic pancreatic body lesion is decreased and now subcentimeter in size, suggesting a benign lesion. 4. Aortic Atherosclerosis (ICD10-I70.0).   03/03/2018 - 03/07/2018 Hospital Admission   She was hospitalized for GI bleed requiring blood transfusions. Xarelto was placed on hold   03/07/2018 PET scan   1. Persistent hazy omental interstitial nodularity but no hypermetabolism or discrete measurable nodules. No abdominal ascites. 2. No findings for metastatic disease involving the chest. 3. Moderate-sized right pleural effusion and small left pleural effusion.    03/20/2018 Pathology Results   1. Ovary and fallopian tube, right - OVARY AND FALLOPIAN TUBE INVOLVED BY SEROUS CARCINOMA. - PARATUBAL CYST. 2. Uterus +/- tubes/ovaries, neoplastic, cervix, left ovary and fallopian tube - LEFT OVARY: HIGH GRADE SEROUS CARCINOMA WITH TREATMENT EFFECT, SPANNING 2.5 CM. CARCINOMA INVOLVES OVARIAN SURFACE. SEE ONCOLOGY TABLE. - LEFT FALLOPIAN TUBE: INVOLVED BY SEROUS CARCINOMA. - UTERUS: -ENDOMETRIUM: INACTIVE ENDOMETRIUM. NO HYPERPLASIA OR MALIGNANCY. -MYOMETRIUM: UNREMARKABLE. NO MALIGNANCY. -SEROSA: INVOLVED BY SEROUS CARCINOMA. - CERVIX: ENDOCERVICAL POLYP. NO MALIGNANCY. 3. Omentum, resection for tumor - INVOLVED BY SEROUS CARCINOMA. 4. Soft tissue,  biopsy, mesenteric nodule - INVOLVED BY SEROUS CARCINOMA. Microscopic Comment 2. OVARY or FALLOPIAN TUBE or PRIMARY PERITONEUM: Procedure: Total hysterectomy and bilateral salpingo-oophorectomy. Omentectomy. Mesenteric lymph node biopsy. Specimen Integrity: Intact. Tumor Site: Left ovary. Ovarian Surface Involvement (required only if applicable): Present. Fallopian Tube Surface Involvement (required only if applicable): Present, bilateral. Tumor Size: 2.5 cm. Histologic Type: High grade serous carcinoma. Histologic Grade: High grade. Implants (required for advanced stage serous/seromucinous borderline tumors only): N/A. Other Tissue/ Organ Involvement: Bilateral fallopian tubes, right ovary, uterine serosa, omentum. Largest Extrapelvic Peritoneal Focus (required only if applicable): Microscopic, estimated 0.5 cm (omentum). Peritoneal/Ascitic Fluid: Prior Positive ([id number]). Treatment Effect (required only for high-grade serous carcinomas): Present in left ovary. CRS2. Regional Lymph Nodes: No lymph nodes submitted/identified. Pathologic Stage Classification (pTNM, AJCC 8th Edition): ypT3b, ypNX Representative Tumor Block: 1A, 1B, 41F, 41F. Comment(s): The right ovary has only surface deposits with a large paratubal cyst. The left ovary has intraparenchymal tumor with associated treatment effect. Thus the tumor location is classified as a left ovarian primary.   03/20/2018 Surgery   Procedure(s) Performed:  1. Exploratory laparotomy with total hysterectomy and bilateral salpingo-oophorectomy 2. Infragastic Omentectomy  3. Debulking to <1cm gross residual disease   Surgeon: Mart Piggs, MD  Specimens: Uterus Cervix, Bilateral tubes / ovaries and omentum. Mesenteric nodule.  Operative Findings: Debulked to gross residual disease <1cm; however there is miliary disease in multiple locations including the majority of the abdominal peritoneum (anterior abdominal wall,  bilateral gutters), diaphragm (Right>left), majority of small bowel mesentary.  Normal appendix. Normal small uterus. Right ovary with a cystic lesion ~3cm, some adhesive disease of right adnexa to rectum/sigmoid. Gross omental disease, which was resected with the omentectomy. Smooth liver surface, but again, diaphragmatic disease noted.      03/20/2018 Genetic Testing   Patient has genetic testing done for ER/PR. Results revealed patient has ER: 90%, PR 0%.    03/31/2018 Tumor Marker   Patient's tumor was tested for the following markers: CA125 Results of the tumor marker test revealed 215.8    Genetic Testing   Patient has genetic testing done for BRCA 1. Results revealed patient has the following: BRCA 1: no loss of expression.    Genetic Testing   Patient has genetic testing done for MMR . Results revealed patient has the following:  MMR: normal   03/31/2018 Genetic Testing   Patient has genetic testing done for BRCA1/2. Results revealed patient has no actionable mutations. She is found to have [place] genetic change of unknown significance   04/21/2018 Imaging   1. No definite findings of residual or recurrent metastatic disease in the abdomen or pelvis status post interval TAHBSO and omentectomy. Stable minimal thickening in the paracolic gutters without discrete peritoneal nodularity. 2. Trace free fluid in the pelvic cul-de-sac. 3. Stable small dependent right pleural effusion. 4. Subcentimeter pancreatic body cystic lesion is stable to slightly decreased. 5. Aortic Atherosclerosis (ICD10-I70.0).   04/21/2018 Tumor Marker   Patient's tumor was tested for the following markers: CA125 Results of the tumor marker test revealed 187.3   04/23/2018 - 09/12/2018 Anti-estrogen oral therapy   She is placed on Femara   05/08/2018 Procedure   Successful ultrasound guided right thoracentesis yielding 800 mL of pleural fluid. Fluid cytology is negative for malignancy    05/28/2018  Tumor Marker   Patient's tumor was tested for the following markers: CA125 Results of the tumor marker test revealed 190   06/30/2018 Tumor Marker   Patient's tumor was tested for the following markers: CA125 Results of the tumor marker test revealed 148   07/23/2018 Imaging   Status post hysterectomy and bilateral salpingo-oophorectomy.  Very mild peritoneal thickening/nodularity, equivocal but worrisome for very mild peritoneal disease. Attention on follow-up is suggested.  Small right pleural effusion with indwelling pleural drain.   07/23/2018 Tumor Marker   Patient's tumor was tested for the following markers: CA125 Results of the tumor marker test revealed 215   09/11/2018 Tumor Marker   Patient's tumor was tested for the following markers: CA-125. Results of the tumor marker test revealed 1323   09/12/2018 Imaging   1. Mildly progressive ascites with some mild degree of nodularity most appreciable along the left adnexa, right paracolic gutter, right upper quadrant, compatible with peritoneal spread of tumor. 2. Small right pleural effusion with indwelling right pleural catheter again noted. Mild increase in atelectasis anteriorly at the right lung base. 3. Aortic Atherosclerosis (ICD10-I70.0). Coronary atherosclerosis. 4. Several tiny hypodense lesions in the spleen are technically nonspecific, but stable. 5. Air fluid level in the rectum compatible with diarrheal process. 6. Prominent bilateral hip arthropathy. Left hip screw noted.   09/22/2018 - 02/02/2019 Chemotherapy   The patient had carboplatin and gemzar   09/29/2018 Tumor Marker   Patient's tumor was tested for the following markers: CA125 Results of the tumor marker test revealed 1219   09/29/2018 Adverse Reaction   Cycle 1 day 8 of Gemzar was omitted due to severe anemia   10/20/2018 Tumor Marker   Patient's tumor was tested  for the following markers: CA125 Results of the tumor marker test revealed 402    11/13/2018 Tumor Marker   Patient's tumor was tested for the following markers: CA125 Results of the tumor marker test revealed 437   11/26/2018 Imaging   1. Mild residual peritoneal thickening, overall decreased from 09/11/2018, with resolution of previously seen ascites. 2. Tiny residual right pleural effusion with small bore chest tube in place. 3. Supraumbilical midline ventral hernia has a wide neck and contains unobstructed colon. 4.  Aortic atherosclerosis (ICD10-170.0).    01/05/2019 Tumor Marker   Patient's tumor was tested for the following markers: CA125 Results of the tumor marker test revealed 929   02/02/2019 Tumor Marker   Patient's tumor was tested for the following markers: CA125 Results of the tumor marker test revealed 1198   02/09/2019 Imaging   1. New mild ascites. Mild peritoneal thickening shows no significant change, but remains consistent with peritoneal carcinomatosis. 2. Increased tiny bilateral pleural effusions. 3. Partial small bowel obstruction with transition point in the anterior lower abdomen, just deep to the anterior abdominal wall. 4. Stable small ventral abdominal wall hernia containing transverse colon.   02/17/2019 -  Chemotherapy   The patient had PACLitaxel for chemotherapy treatment.    03/05/2019 Tumor Marker   Patient's tumor was tested for the following markers: CA125 Results of the tumor marker test revealed 1243   03/11/2019 Imaging   CXR Small bilateral subpulmonic effusions with basilar atelectasis. These are probably slightly larger than were seen in March.   03/30/2019 Tumor Marker   Patient's tumor was tested for the following markers: CA-125 Results of the tumor marker test revealed 776     REVIEW OF SYSTEMS:   Constitutional: Denies fevers, chills or abnormal weight loss Eyes: Denies blurriness of vision Ears, nose, mouth, throat, and face: Denies mucositis or sore throat Respiratory: Denies cough, dyspnea or  wheezes Cardiovascular: Denies palpitation, chest discomfort or lower extremity swelling Gastrointestinal:  Denies nausea, heartburn or change in bowel habits Skin: Denies abnormal skin rashes Lymphatics: Denies new lymphadenopathy or easy bruising Neurological:Denies numbness, tingling or new weaknesses Behavioral/Psych: Mood is stable, no new changes  All other systems were reviewed with the patient and are negative.  I have reviewed the past medical history, past surgical history, social history and family history with the patient and they are unchanged from previous note.  ALLERGIES:  is allergic to codeine.  MEDICATIONS:  Current Outpatient Medications  Medication Sig Dispense Refill  . albuterol (PROAIR HFA) 108 (90 Base) MCG/ACT inhaler Inhale 2 puffs into the lungs every 6 (six) hours as needed for wheezing or shortness of breath. 18 g 5  . ALPRAZolam (XANAX) 0.25 MG tablet Take 1 tablet (0.25 mg total) by mouth at bedtime as needed for anxiety. 5 tablet 0  . azelastine (ASTELIN) 0.1 % nasal spray Place 2 sprays into both nostrils 2 (two) times daily as needed for allergies.     . benzonatate (TESSALON) 100 MG capsule Take 1 capsule (100 mg total) by mouth every 6 (six) hours as needed for cough. (Patient not taking: Reported on 04/26/2019) 30 capsule 0  . Cholecalciferol (VITAMIN D) 2000 units CAPS Take 2,000 Units by mouth daily.     Marland Kitchen dicyclomine (BENTYL) 10 MG capsule Take 1 capsule (10 mg total) by mouth 2 (two) times daily. 60 capsule 1  . diltiazem (CARDIZEM CD) 180 MG 24 hr capsule Take 1 capsule (180 mg total) by mouth 2 (two) times a day.  180 capsule 3  . diphenhydrAMINE (BENADRYL) 25 mg capsule Take 25 mg by mouth every 8 (eight) hours as needed for itching.     . diphenoxylate-atropine (LOMOTIL) 2.5-0.025 MG tablet Take 1 tablet by mouth 4 (four) times daily. (Patient taking differently: Take 1 tablet by mouth 4 (four) times daily as needed for diarrhea or loose stools. )  360 tablet 1  . DULoxetine (CYMBALTA) 60 MG capsule TAKE 1 CAPSULE(60 MG) BY MOUTH DAILY (Patient taking differently: Take 60 mg by mouth at bedtime. ) 30 capsule 3  . fluticasone (FLONASE) 50 MCG/ACT nasal spray Place 2 sprays into both nostrils 2 (two) times daily as needed (FOR NASAL CONGESTION.).     Marland Kitchen furosemide (LASIX) 40 MG tablet Take 1 tablet (40 mg total) by mouth daily. 90 tablet 3  . halobetasol (ULTRAVATE) 0.05 % cream Apply 1 application topically 2 (two) times daily as needed (psoriasis).     Marland Kitchen levothyroxine (SYNTHROID, LEVOTHROID) 137 MCG tablet Take 137 mcg by mouth daily before breakfast. For thyroid therapy    . Magnesium 400 MG CAPS Take 400 mg by mouth 2 (two) times a day.     . metoprolol tartrate (LOPRESSOR) 25 MG tablet Take 1 tablet (25 mg total) by mouth 2 (two) times daily as needed. 180 tablet 3  . mirtazapine (REMERON) 15 MG tablet Take 1 tablet (15 mg total) by mouth at bedtime. 30 tablet 9  . oxyCODONE (ROXICODONE) 15 MG immediate release tablet Take 1 tablet (15 mg total) by mouth every 4 (four) hours as needed for pain. 60 tablet 0  . polyvinyl alcohol (LIQUIFILM TEARS) 1.4 % ophthalmic solution Place 1 drop into both eyes 2 (two) times daily as needed for dry eyes.     . potassium chloride SA (K-DUR,KLOR-CON) 20 MEQ tablet TAKE 1 TABLET BY MOUTH DAILY AND TAKE 1 ADDITIONAL TABLET WHEN TAKING EXTRA LASIX DOSE (Patient taking differently: Take 20 mEq by mouth daily. ) 60 tablet 6  . STIOLTO RESPIMAT 2.5-2.5 MCG/ACT AERS INHALE 2 PUFFS INTO THE LUNGS DAILY (Patient taking differently: Inhale 2 puffs into the lungs daily. ) 1 g 3  . XARELTO 20 MG TABS tablet TAKE 1 TABLET BY MOUTH EVERY DAY AFTER SUPPER (Patient taking differently: Take 20 mg by mouth daily with supper. ) 90 tablet 3   No current facility-administered medications for this visit.    Facility-Administered Medications Ordered in Other Visits  Medication Dose Route Frequency Provider Last Rate Last Dose   . 0.9 %  sodium chloride infusion (Manually program via Guardrails IV Fluids)  250 mL Intravenous Once Heath Lark, MD   Stopped at 12/16/18 1500    PHYSICAL EXAMINATION: ECOG PERFORMANCE STATUS: 2 - Symptomatic, <50% confined to bed  Vitals:   04/30/19 1118  BP: 105/70  Pulse: 74  Resp: 18  Temp: 97.7 F (36.5 C)  SpO2: 95%   Filed Weights   04/30/19 1118  Weight: 175 lb 6.4 oz (79.6 kg)    GENERAL:alert, no distress and comfortable.  She looks frail and debilitated SKIN: skin color, texture, turgor are normal, no rashes or significant lesions EYES: normal, Conjunctiva are pink and non-injected, sclera clear OROPHARYNX:no exudate, no erythema and lips, buccal mucosa, and tongue normal  NECK: supple, thyroid normal size, non-tender, without nodularity LYMPH:  no palpable lymphadenopathy in the cervical, axillary or inguinal LUNGS: Noted crackles on both lung bases HEART: regular rate & rhythm and no murmurs and no lower extremity edema ABDOMEN:abdomen soft, non-tender and  normal bowel sounds Musculoskeletal:no cyanosis of digits and no clubbing  NEURO: alert & oriented x 3 with fluent speech, no focal motor/sensory deficits  LABORATORY DATA:  I have reviewed the data as listed    Component Value Date/Time   NA 136 04/30/2019 1017   NA 135 (L) 09/24/2017 0945   K 3.9 04/30/2019 1017   K 3.7 09/24/2017 0945   CL 98 04/30/2019 1017   CO2 31 04/30/2019 1017   CO2 30 (H) 09/24/2017 0945   GLUCOSE 109 (H) 04/30/2019 1017   GLUCOSE 101 09/24/2017 0945   BUN 17 04/30/2019 1017   BUN 15.3 09/24/2017 0945   CREATININE 1.17 (H) 04/30/2019 1017   CREATININE 0.8 09/24/2017 0945   CALCIUM 9.1 04/30/2019 1017   CALCIUM 9.1 09/24/2017 0945   PROT 7.0 04/30/2019 1017   PROT 6.6 09/24/2017 0945   ALBUMIN 3.1 (L) 04/30/2019 1017   ALBUMIN 3.3 (L) 09/24/2017 0945   AST 15 04/30/2019 1017   AST 16 09/24/2017 0945   ALT 9 04/30/2019 1017   ALT 12 09/24/2017 0945   ALKPHOS 68  04/30/2019 1017   ALKPHOS 52 09/24/2017 0945   BILITOT 0.3 04/30/2019 1017   BILITOT 1.13 09/24/2017 0945   GFRNONAA 43 (L) 04/30/2019 1017   GFRAA 50 (L) 04/30/2019 1017    No results found for: SPEP, UPEP  Lab Results  Component Value Date   WBC 7.2 04/30/2019   NEUTROABS 4.3 04/30/2019   HGB 8.6 (L) 04/30/2019   HCT 28.8 (L) 04/30/2019   MCV 82.1 04/30/2019   PLT 253 04/30/2019      Chemistry      Component Value Date/Time   NA 136 04/30/2019 1017   NA 135 (L) 09/24/2017 0945   K 3.9 04/30/2019 1017   K 3.7 09/24/2017 0945   CL 98 04/30/2019 1017   CO2 31 04/30/2019 1017   CO2 30 (H) 09/24/2017 0945   BUN 17 04/30/2019 1017   BUN 15.3 09/24/2017 0945   CREATININE 1.17 (H) 04/30/2019 1017   CREATININE 0.8 09/24/2017 0945      Component Value Date/Time   CALCIUM 9.1 04/30/2019 1017   CALCIUM 9.1 09/24/2017 0945   ALKPHOS 68 04/30/2019 1017   ALKPHOS 52 09/24/2017 0945   AST 15 04/30/2019 1017   AST 16 09/24/2017 0945   ALT 9 04/30/2019 1017   ALT 12 09/24/2017 0945   BILITOT 0.3 04/30/2019 1017   BILITOT 1.13 09/24/2017 0945       RADIOGRAPHIC STUDIES: I have personally reviewed the radiological images as listed and agreed with the findings in the report. Dg Chest 2 View  Result Date: 04/26/2019 CLINICAL DATA:  Chest discomfort, hx of a-fib, low O2 sat at home- pt undergoing chemo for ovarian cancer EXAM: CHEST - 2 VIEW COMPARISON:  04/17/2019 FINDINGS: Power injectable right IJ Port-A-Cath tip: SVC. Atherosclerotic calcification of the aortic arch. Heart size within normal limits. Blunting of both costophrenic angles compatible with pleural effusions. Subtle subpleural lucency along the right lung apex; although this may be from a skin wrinkle, I cannot completely exclude a small apical pneumothorax. New nodularity in the left lung apex including a 1.3 cm pulmonary nodule projecting of the left first rib and perhaps another adjacent nodule. Thoracic  spondylosis. Linear subsegmental atelectasis at the left lung base. IMPRESSION: 1. I cannot exclude a tiny right apical pneumothorax. Also, there are potential new pulmonary nodules at the left lung apex. Chest CT is recommended for further characterization. 2.  Small bilateral pleural effusions. 3.  Aortic Atherosclerosis (ICD10-I70.0). Electronically Signed   By: Van Clines M.D.   On: 04/26/2019 15:22   Ct Chest Wo Contrast  Result Date: 04/26/2019 CLINICAL DATA:  85 year old presenting with current history of ovarian cancer for which she is undergoing chemotherapy, current history of atrial fibrillation, presenting with chest discomfort. Abnormal chest x-ray earlier today questioning a RIGHT apically pneumothorax and LEFT apically lung nodules. EXAM: CT CHEST WITHOUT CONTRAST TECHNIQUE: Multidetector CT imaging of the chest was performed following the standard protocol without IV contrast. COMPARISON:  No prior chest CT. FINDINGS: Cardiovascular: Heart size upper normal to slightly enlarged. Severe three-vessel coronary atherosclerosis. RIGHT jugular Port-A-Cath tip at the cavoatrial junction. No pericardial effusion. Moderate to severe atherosclerosis involving the thoracic and proximal abdominal aorta without evidence of aneurysm. Mediastinum/Nodes: Numerous normal sized lymph nodes throughout the mediastinum. No pathologic lymphadenopathy. Fluid in the mid portion of the normal appearing esophagus without evidence of hiatal hernia. Mild thyromegaly without nodularity. Lungs/Pleura: No evidence of a RIGHT pneumothorax or LEFT apical nodules as questioned on the earlier chest x-ray. Emphysematous changes in both lungs. No parenchymal nodules or masses to suggest metastatic disease. Moderately large RIGHT pleural effusion and associated consolidation in the RIGHT LOWER LOBE. Small LEFT pleural effusion and associated mild consolidation in the LEFT LOWER LOBE. Moderate BILATERAL lower lobe and mild  BILATERAL upper lobe bronchiectasis. Central airways patent with moderate to marked bronchial wall thickening. Upper Abdomen: Minimal perihepatic ascites. Visualized upper abdomen otherwise unremarkable for the unenhanced technique. Musculoskeletal: Osseous demineralization. Benign bone island in the T11 vertebral body. Degenerative changes and DISH involving the mid and LOWER thoracic spine. No acute findings. IMPRESSION: 1. No evidence of a RIGHT pneumothorax or LEFT apical nodules as questioned on the earlier chest x-ray. 2. Moderately large RIGHT pleural effusion and small LEFT pleural effusion with associated passive atelectasis (favored over pneumonia) in the lower lobes. 3. Moderate to marked central bronchial wall thickening indicating asthma and/or bronchitis. 4. Moderate bilateral lower lobe and mild bilateral upper lobe bronchiectasis. 5. Minimal perihepatic ascites. Aortic Atherosclerosis (ICD10-I70.0) and Emphysema (ICD10-J43.9). Electronically Signed   By: Evangeline Dakin M.D.   On: 04/26/2019 16:19   Dg Chest Port 1 View  Result Date: 04/17/2019 CLINICAL DATA:  85 year old with palpitations. EXAM: PORTABLE CHEST 1 VIEW COMPARISON:  03/30/2019 FINDINGS: Stable position of the right jugular Port-A-Cath. Catheter tip in the lower SVC region. Chronic blunting at the lung bases is unchanged. No focal airspace disease or pulmonary edema. Heart size is stable and within normal limits. Atherosclerotic calcifications at the aortic arch. Bone structures are unremarkable. IMPRESSION: No acute cardiopulmonary disease. Electronically Signed   By: Markus Daft M.D.   On: 04/17/2019 09:51    All questions were answered. The patient knows to call the clinic with any problems, questions or concerns. No barriers to learning was detected.  I spent 25 minutes counseling the patient face to face. The total time spent in the appointment was 30 minutes and more than 50% was on counseling and review of test  results  Heath Lark, MD 04/30/2019 2:35 PM

## 2019-04-30 NOTE — Assessment & Plan Note (Signed)
She continues to tolerate treatment very poorly with complications such as anemia, pain, neuropathy and recurrent infection/hospitalization Her blood count is stable today Despite progression on multiple lines of treatment, she wants to continue chemotherapy Her recent tumor marker is mildly improved I recommend minimum 6 doses of chemo before repeat imaging study, due probably end of August

## 2019-05-01 ENCOUNTER — Other Ambulatory Visit: Payer: Self-pay

## 2019-05-01 ENCOUNTER — Inpatient Hospital Stay: Payer: Medicare Other

## 2019-05-01 VITALS — BP 132/64 | HR 97 | Temp 98.5°F | Resp 18

## 2019-05-01 DIAGNOSIS — Z5111 Encounter for antineoplastic chemotherapy: Secondary | ICD-10-CM | POA: Diagnosis not present

## 2019-05-01 DIAGNOSIS — C562 Malignant neoplasm of left ovary: Secondary | ICD-10-CM

## 2019-05-01 LAB — CA 125: Cancer Antigen (CA) 125: 180 U/mL — ABNORMAL HIGH (ref 0.0–38.1)

## 2019-05-01 MED ORDER — HEPARIN SOD (PORK) LOCK FLUSH 100 UNIT/ML IV SOLN
500.0000 [IU] | Freq: Once | INTRAVENOUS | Status: AC | PRN
  Administered 2019-05-01: 11:00:00 500 [IU]
  Filled 2019-05-01: qty 5

## 2019-05-01 MED ORDER — SODIUM CHLORIDE 0.9% FLUSH
10.0000 mL | INTRAVENOUS | Status: DC | PRN
  Administered 2019-05-01: 10 mL
  Filled 2019-05-01: qty 10

## 2019-05-01 MED ORDER — DIPHENHYDRAMINE HCL 50 MG/ML IJ SOLN
INTRAMUSCULAR | Status: AC
  Filled 2019-05-01: qty 1

## 2019-05-01 MED ORDER — FAMOTIDINE IN NACL 20-0.9 MG/50ML-% IV SOLN
20.0000 mg | Freq: Once | INTRAVENOUS | Status: AC
  Administered 2019-05-01: 20 mg via INTRAVENOUS

## 2019-05-01 MED ORDER — SODIUM CHLORIDE 0.9 % IV SOLN
Freq: Once | INTRAVENOUS | Status: AC
  Administered 2019-05-01: 08:00:00 via INTRAVENOUS
  Filled 2019-05-01: qty 250

## 2019-05-01 MED ORDER — SODIUM CHLORIDE 0.9 % IV SOLN
64.0000 mg/m2 | Freq: Once | INTRAVENOUS | Status: AC
  Administered 2019-05-01: 10:00:00 126 mg via INTRAVENOUS
  Filled 2019-05-01: qty 21

## 2019-05-01 MED ORDER — SODIUM CHLORIDE 0.9 % IV SOLN
20.0000 mg | Freq: Once | INTRAVENOUS | Status: AC
  Administered 2019-05-01: 20 mg via INTRAVENOUS
  Filled 2019-05-01: qty 20

## 2019-05-01 MED ORDER — DIPHENHYDRAMINE HCL 50 MG/ML IJ SOLN
50.0000 mg | Freq: Once | INTRAMUSCULAR | Status: AC
  Administered 2019-05-01: 50 mg via INTRAVENOUS

## 2019-05-01 MED ORDER — FAMOTIDINE IN NACL 20-0.9 MG/50ML-% IV SOLN
INTRAVENOUS | Status: AC
  Filled 2019-05-01: qty 50

## 2019-05-01 NOTE — Patient Instructions (Signed)
[facility] Discharge Instructions for Patients Receiving Chemotherapy  Today you received the following chemotherapy agents:  Taxol.  To help prevent nausea and vomiting after your treatment, we encourage you to take your nausea medication as directed.   If you develop nausea and vomiting that is not controlled by your nausea medication, call the clinic.   BELOW ARE SYMPTOMS THAT SHOULD BE REPORTED IMMEDIATELY:  *FEVER GREATER THAN 100.5 F  *CHILLS WITH OR WITHOUT FEVER  NAUSEA AND VOMITING THAT IS NOT CONTROLLED WITH YOUR NAUSEA MEDICATION  *UNUSUAL SHORTNESS OF BREATH  *UNUSUAL BRUISING OR BLEEDING  TENDERNESS IN MOUTH AND THROAT WITH OR WITHOUT PRESENCE OF ULCERS  *URINARY PROBLEMS  *BOWEL PROBLEMS  UNUSUAL RASH Items with * indicate a potential emergency and should be followed up as soon as possible.  Feel free to call the clinic should you have any questions or concerns. The clinic phone number is [phone].  Please show the CHEMO ALERT CARD at check-in to the Emergency Department and triage nurse.   

## 2019-05-04 ENCOUNTER — Encounter: Payer: Self-pay | Admitting: Cardiology

## 2019-05-04 ENCOUNTER — Other Ambulatory Visit: Payer: Self-pay | Admitting: Internal Medicine

## 2019-05-04 ENCOUNTER — Ambulatory Visit (INDEPENDENT_AMBULATORY_CARE_PROVIDER_SITE_OTHER): Payer: Medicare Other

## 2019-05-04 DIAGNOSIS — R001 Bradycardia, unspecified: Secondary | ICD-10-CM | POA: Diagnosis not present

## 2019-05-05 ENCOUNTER — Telehealth: Payer: Self-pay

## 2019-05-05 NOTE — Telephone Encounter (Signed)
Zio reports afib HR 106 bpm, reviewed by Dr.McDowell, pt with known PAF, reason for monitor is to make sure patient does not get too bradycardic

## 2019-05-06 ENCOUNTER — Telehealth: Payer: Self-pay | Admitting: Licensed Clinical Social Worker

## 2019-05-06 ENCOUNTER — Telehealth: Payer: Self-pay | Admitting: Cardiology

## 2019-05-06 NOTE — Telephone Encounter (Signed)
New message   Patient had chemo Friday  and now is in afib - per daughter and this happens every time she takes the chemo STAT if HR is under 50 or over 120 patient is wearing and irhythm monitor (normal HR is 60-100 beats per minute)  1) What is your heart rate? Is on a monitor from 70-105  2) Do you have a log of your heart rate readings (document readings)? no  3) Do you have any other symptoms? States he chest feels funny , no other symptoms

## 2019-05-06 NOTE — Telephone Encounter (Signed)
Thank you for the update.  Yes, she has had difficulty with recurring atrial fibrillation, particularly with her chemotherapy. If what is reported about her heart rate is accurate, 70-105 range is reasonable on current therapy.  Would observe for now on current treatment.

## 2019-05-06 NOTE — Progress Notes (Signed)
Virtual Visit via Telephone Note   This visit type was conducted due to national recommendations for restrictions regarding the COVID-19 Pandemic (e.g. social distancing) in an effort to limit this patient's exposure and mitigate transmission in our community.  Due to her co-morbid illnesses, this patient is at least at moderate risk for complications without adequate follow up.  This format is felt to be most appropriate for this patient at this time.  The patient did not have access to video technology/had technical difficulties with video requiring transitioning to audio format only (telephone).  All issues noted in this document were discussed and addressed.  No physical exam could be performed with this format.  Please refer to the patient's chart for her  consent to telehealth for [facility].   Date:  05/18/2019   ID:  [name], DOB Apr 09, 1933, MRN [id number]  Patient Location: Home Provider Location: Home  PCP:  Asencion Noble, MD  Cardiologist:  Rozann Lesches, MD   Electrophysiologist:  None   Evaluation Performed:  Follow-Up Visit  Chief Complaint:   palpitations  History of Present Illness:    [name] is a 85 y.o. female with  with history of paroxysmal atrial fibrillation CHA2DS2-VASc equals 3 on Xarelto, chronic diastolic CHF, history of ovarian cancer undergoing chemotherapy, recurrent right pleural effusion Pleurx catheter in place on Lasix and potassium and followed by Dr. Servando Snare.  Was in the ED 04/17/2019 with recurrent atrial fibrillation with RVR and converted to normal sinus rhythm with IV Cardizem bolus and drip. Her diltiazem was increased to 180 mg twice daily. She called in to the office 04/18/2019 complaining of her heart rate increasing again. [name], PA-C told her to take the evening dose of diltiazem at that time.  I saw her 04/20/19 at which time she seemed to hav Afib after chemo.   Patient has Zio monitor on and 05/06/19 she had afib  70-105 which Dr. Domenic Polite felt was reasonable. No changes made.  Patient woke up this am and HR 150 but came down to 70/m when she took her diltiazem. She's only taken metoprolol once. Occasional shortness of breath but overall breathing ok. CT 7/19 showed moderate right pleural effusion.    The patient does not have symptoms concerning for COVID-19 infection (fever, chills, cough, or new shortness of breath).    Past Medical History:  Diagnosis Date   Anxiety    Chronic blood loss anemia    03-04-2018 diverticular bleed and rectal bleeding,  transfused 2 units PRBCs 03-08-2018   Colitis    COPD (chronic obstructive pulmonary disease) (HCC)    Dr. Lamonte Sakai   Depression    Diastolic CHF, chronic ([place])    Diverticulosis    Family history of colon cancer    Fibromyalgia    Genetic testing 04/07/2018   MyRisk (35 genes) @ [facility] - No pathogenic mutations detected   GERD (gastroesophageal reflux disease)    Hemorrhoids    Hiatal hernia    Hip pain 07/2018   Right Hip Pain   History of rectal polyps    History of shingles    Hypothyroidism    IBS (irritable bowel syndrome)    Lymphocytic colitis    Dr. Henrene Pastor   Malignant ascites    Admission 06/2017 abdominal s/p parencentesis 07-01-2017 2.5L, 07-08-2016  2.7L, 07-12-2017  1414m   [place] drug [place]    On home O2    Osteoporosis    Ovarian cancer (  [place])    Chemotherapy - Dr. Alvy Bimler   Paroxysmal atrial fibrillation [facility])    Xarelto stopped 03-07-2018 due to lower GI bleed   Pleural effusion    s/p  right thoracentesis, 02-2018 1.3L and 03-17-2018 right thoracentesis 63m , post cxr no residual effusion   Psoriatic arthritis ([place]    Schatzki's ring 2013   Seasonal allergic rhinitis    Past Surgical History:  Procedure Laterality Date   CARDIOVASCULAR STRESS TEST  09/23/2012   Low risk lexiscan nuclear study w/ apical thinning but no evidence of ischemia/  normal LV  function and wall motion , ef 75%   CATARACT EXTRACTION W/ INTRAOCULAR LENS  IMPLANT, BILATERAL  10/2016   CHEST TUBE INSERTION Right 06/24/2018   Procedure: INSERTION PLEURAL DRAINAGE CATHETER;  Surgeon: GGrace Isaac MD;  [place] [facility]  Service: Thoracic;  Laterality: Right;   COLONOSCOPY     DEBULKING N/A 03/20/2018   Procedure: DEBULKING;  Surgeon: PIsabel Caprice MD;  Location: [facility];  Service: Gynecology;  Laterality: N/A;   EXAM UNDER ANESTHESIA WITH MANIPULATION OF KNEE Left 12-20-2003  dr wNoemi Chapel  post TKA   FEMUR IM NAIL Left 12/11/2013   Procedure: INTRAMEDULLARY (IM) NAIL FEMORAL;  Surgeon: FGearlean Alf MD;  [facility]: [facility];  Service: Orthopedics;  Laterality: Left;   HYSTERECTOMY ABDOMINAL WITH SALPINGO-OOPHORECTOMY Bilateral 03/20/2018   Procedure: TOTAL HYSTERECTOMY ABDOMINAL WITH BILATERAL  SALPINGO-OOPHORECTOMY;  Surgeon: PIsabel Caprice MD;  [facility];  Service: Gynecology;  Laterality: Bilateral;   IR FLUORO GUIDE PORT INSERTION RIGHT  07/22/2017   IR PARACENTESIS  07/12/2017   IR THORACENTESIS ASP PLEURAL SPACE W/IMG GUIDE  03/17/2018   IR THORACENTESIS ASP PLEURAL SPACE W/IMG GUIDE  05/08/2018   IR [place] VASC ACCESS RIGHT  07/22/2017   KNEE ARTHROSCOPY W/ LATERAL RELEASE Left 09-03-2005   [facility]  w/  Lysis Adhesions,  excision loose body's   LAPAROSCOPIC CHOLECYSTECTOMY  12-04-2010  dr zeigler   LAPAROTOMY N/A 03/20/2018   Procedure: EXPLORATORY LAPAROTOMY;  Surgeon: PIsabel Caprice MD;  [facility];  Service: Gynecology;  Laterality: N/A;   OMENTECTOMY N/A 03/20/2018   Procedure: OMENTECTOMY;  Surgeon: PIsabel Caprice MD;  [facility];  Service: Gynecology;  Laterality: N/A;   OTHER SURGICAL HISTORY  06/24/2018   Plurex Catheter inserted into the lungs   TOTAL KNEE ARTHROPLASTY Left 09-08-2003   dr [place]Noemi [place] 08/06/2012   Procedure: TOTAL KNEE REVISION;  Surgeon: FGearlean Alf  MD;  [facility];  Service: Orthopedics;  Laterality: Left;  Left Total Knee Arthroplasty Revision   TRANSTHORACIC ECHOCARDIOGRAM  08/20/2017   ef 60-65%,  grade 1 diastolic dysfunction/  trivial AR and TR   Uterine polypectomy       Current Meds  Medication Sig   albuterol (PROAIR HFA) 108 (90 Base) MCG/ACT inhaler Inhale 2 puffs into the lungs every 6 (six) hours as needed for wheezing or shortness of breath.   ALPRAZolam (XANAX) 0.25 MG tablet Take 1 tablet (0.25 mg total) by mouth at bedtime as needed for anxiety.   azelastine (ASTELIN) 0.1 % nasal spray Place 2 sprays into both nostrils 2 (two) times daily as needed for allergies.    Cholecalciferol (VITAMIN D) 2000 units CAPS Take 2,000 Units by mouth daily.    dicyclomine (BENTYL) 10 MG capsule TAKE 1 CAPSULE(10 MG) BY MOUTH TWICE DAILY   diltiazem (CARDIZEM  CD) 180 MG 24 hr capsule Take 1 capsule (180 mg total) by mouth 2 (two) times a day.   diphenhydrAMINE (BENADRYL) 25 mg capsule Take 25 mg by mouth every 8 (eight) hours as needed for itching.    DULoxetine (CYMBALTA) 60 MG capsule Take 1 capsule (60 mg total) by mouth at bedtime.   fluticasone (FLONASE) 50 MCG/ACT nasal spray Place 2 sprays into both nostrils 2 (two) times daily as needed (FOR NASAL CONGESTION.).    furosemide (LASIX) 40 MG tablet Take 1 tablet (40 mg total) by mouth daily.   halobetasol (ULTRAVATE) 0.05 % cream Apply 1 application topically 2 (two) times daily as needed (psoriasis).    levothyroxine (SYNTHROID, LEVOTHROID) 137 MCG tablet Take 137 mcg by mouth daily before breakfast. For thyroid therapy   Magnesium 400 MG CAPS Take 400 mg by mouth 2 (two) times a day.    metoprolol tartrate (LOPRESSOR) 25 MG tablet Take 1 tablet (25 mg total) by mouth 2 (two) times daily as needed.   mirtazapine (REMERON) 15 MG tablet Take 1 tablet (15 mg total) by mouth at bedtime.   oxyCODONE (ROXICODONE) 15 MG immediate release tablet Take 1 tablet (15 mg  total) by mouth every 4 (four) hours as needed for pain.   polyvinyl alcohol (LIQUIFILM TEARS) 1.4 % ophthalmic solution Place 1 drop into both eyes 2 (two) times daily as needed for dry eyes.    potassium chloride SA (K-DUR,KLOR-CON) 20 MEQ tablet TAKE 1 TABLET BY MOUTH DAILY AND TAKE 1 ADDITIONAL TABLET WHEN TAKING EXTRA LASIX DOSE (Patient taking differently: Take 20 mEq by mouth daily. )   STIOLTO RESPIMAT 2.5-2.5 MCG/ACT AERS INHALE 2 PUFFS INTO THE LUNGS DAILY (Patient taking differently: Inhale 2 puffs into the lungs daily. )   XARELTO 20 MG TABS tablet TAKE 1 TABLET BY MOUTH EVERY DAY AFTER SUPPER (Patient taking differently: Take 20 mg by mouth daily with supper. )     Allergies:   Codeine   Social History   Tobacco Use   Smoking status: Former Smoker    Packs/day: 1.00    Years: 20.00    Pack years: 20.00    Types: Cigarettes    Quit date: 10/08/1980    Years since quitting: 38.6   Smokeless tobacco: Never Used  Substance Use Topics   Alcohol use: Not Currently    Comment: rarely, 12-23-15 rarely   Drug use: No     Family Hx: The patient's family history includes Colon cancer in her maternal grandfather; Colon cancer (age of onset: 58) in her mother; Depression in her sister; Heart disease in her father; Leukemia in her maternal aunt; Lung cancer in her father.  ROS:   Please see the history of present illness.      All other systems reviewed and are negative.   Prior CV studies:   The following studies were reviewed today:   72 hr monitor 05/14/19  72-hour event recorder reviewed. Predominant rhythm is sinus with heart rate ranging from 55 bpm up to 119 bpm and average heart rate 89 bpm. Periods of paroxysmal atrial fibrillation and flutter were noted, with fastest nonsustained heart rate in the 160s. There were no spontaneous or posttermination pauses, and no unusual degree of bradycardia.    Echocardiogram 08/20/2017: Study Conclusions   - Left ventricle:  The cavity size was normal. Wall thickness was   normal. Systolic function was normal. The estimated ejection   fraction was in the range of 60% to 65%.  Wall motion was normal;   there were no regional wall motion abnormalities. Doppler   parameters are consistent with abnormal left ventricular   relaxation (grade 1 diastolic dysfunction). - Aortic valve: There was no stenosis. There was trivial   regurgitation. - Mitral valve: Mildly calcified annulus. There was no significant   regurgitation. - Right ventricle: The cavity size was normal. Systolic function   was normal. - Tricuspid valve: Peak RV-RA gradient (S): 27 mm Hg. - Pulmonary arteries: PA peak pressure: 30 mm Hg (S). - Inferior vena cava: The vessel was normal in size. The   respirophasic diameter changes were in the normal range (= 50%),   consistent with normal central venous pressure.   Impressions:   - Normal LV size with EF 60-65%. Normal RV size and systolic   function. No significant valvular abnormalities.    CT chest 04/26/2019 IMPRESSION: 1. No evidence of a RIGHT pneumothorax or LEFT apical nodules as questioned on the earlier chest x-ray. 2. Moderately large RIGHT pleural effusion and small LEFT pleural effusion with associated passive atelectasis (favored over pneumonia) in the lower lobes. 3. Moderate to marked central bronchial wall thickening indicating asthma and/or bronchitis. 4. Moderate bilateral lower lobe and mild bilateral upper lobe bronchiectasis. 5. Minimal perihepatic ascites.   Aortic Atherosclerosis (ICD10-I70.0) and Emphysema (ICD10-J43.9).     Electronically Signed   By: Evangeline Dakin M.D.   On: 04/26/2019 16:19       Labs/Other Tests and Data Reviewed:    EKG:  72 hr monitor 05/2019 reviewed. see below  Recent Labs: 11/24/2018: Magnesium 1.7 04/17/2019: B Natriuretic Peptide 115.0 05/11/2019: ALT 9; BUN 19; Creatinine, Ser 1.14; Hemoglobin 8.1 Repeated and verified X2.;  Platelets 284.0; Potassium 3.6; Sodium 135   Recent Lipid Panel Lab Results  Component Value Date/Time   CHOL 147 11/14/2017 02:13 PM    Wt Readings from Last 3 Encounters:  05/18/19 168 lb (76.2 kg)  05/11/19 172 lb (78 kg)  04/30/19 175 lb 6.4 oz (79.6 kg)     Objective:    Vital Signs:  BP 121/62    Pulse 71    Ht 5' 4" (1.626 m)    Wt 168 lb (76.2 kg)    BMI 28.84 kg/m    VITAL SIGNS:  reviewed  ASSESSMENT & PLAN:    Paroxysmal atrial fibrillation chadvasc equals 3 on Xarelto-having trouble with recurrent atrial fibrillation with RVR.  Diltiazem increased to 180 mg twice daily 04/17/2019 but continues to have some breakthrough, worse after chemo. I added low dose metoprolol 25 mg to take prn which she has done once.   Zio monitor showed mostly NSR with some break through Afib but no slow HR. Dr. Domenic Polite was satisfied with this. She can continue to take metoprolol prn  Chronic diastolic CHF compensated  COPD on home O2  Ovarian cancer with recurrence and back on chemo  Moderate sized right pleural effusion followed by Dr. Servando Snare    COVID-19 Education: The signs and symptoms of COVID-19 were discussed with the patient and how to seek care for testing (follow up with PCP or arrange E-visit).  The importance of social distancing was discussed today.  Time:   Today, I have spent 9 minutes with the patient with telehealth technology discussing the above problems.     Medication Adjustments/Labs and Tests Ordered: Current medicines are reviewed at length with the patient today.  Concerns regarding medicines are outlined above.   Tests Ordered: No orders of the  defined types were placed in this encounter.   Medication Changes: No orders of the defined types were placed in this encounter.   Follow Up:  In Person in 2 week(s) Dr. Domenic Polite  Signed, Ermalinda Barrios, PA-C  05/18/2019 1:46 PM    [place]

## 2019-05-06 NOTE — Telephone Encounter (Signed)
I will FYI Dr.McDowell 

## 2019-05-06 NOTE — Telephone Encounter (Signed)
CSW referred to assist patient with obtaining a BP cuff. CSW contacted patient to inform cuff will be delivered to home. Patient grateful for support and assistance. CSW available as needed. Jackie Azeez Dunker, LCSW, CCSW-MCS [phone]  

## 2019-05-06 NOTE — Telephone Encounter (Signed)
Daughter made aware of Dr.McDowell's message, daughter relieved

## 2019-05-08 ENCOUNTER — Other Ambulatory Visit: Payer: Self-pay | Admitting: Hematology and Oncology

## 2019-05-11 ENCOUNTER — Telehealth: Payer: Self-pay | Admitting: Emergency Medicine

## 2019-05-11 ENCOUNTER — Other Ambulatory Visit: Payer: Self-pay

## 2019-05-11 ENCOUNTER — Ambulatory Visit (INDEPENDENT_AMBULATORY_CARE_PROVIDER_SITE_OTHER): Payer: Medicare Other | Admitting: Emergency Medicine

## 2019-05-11 ENCOUNTER — Encounter: Payer: Self-pay | Admitting: Emergency Medicine

## 2019-05-11 VITALS — BP 130/82 | HR 92 | Ht 64.0 in | Wt 172.0 lb

## 2019-05-11 DIAGNOSIS — J9611 Chronic respiratory failure with hypoxia: Secondary | ICD-10-CM

## 2019-05-11 DIAGNOSIS — J449 Chronic obstructive pulmonary disease, unspecified: Secondary | ICD-10-CM

## 2019-05-11 DIAGNOSIS — D638 Anemia in other chronic diseases classified elsewhere: Secondary | ICD-10-CM

## 2019-05-11 DIAGNOSIS — J90 Pleural effusion, not elsewhere classified: Secondary | ICD-10-CM

## 2019-05-11 LAB — CBC WITH DIFFERENTIAL/PLATELET
Basophils Absolute: 0 10*3/uL (ref 0.0–0.1)
Basophils Relative: 0.6 % (ref 0.0–3.0)
Eosinophils Absolute: 0.2 10*3/uL (ref 0.0–0.7)
Eosinophils Relative: 3.1 % (ref 0.0–5.0)
HCT: 25.6 % — ABNORMAL LOW (ref 36.0–46.0)
Hemoglobin: 8.1 g/dL — ABNORMAL LOW (ref 12.0–15.0)
Lymphocytes Relative: 13.2 % (ref 12.0–46.0)
Lymphs Abs: 0.8 10*3/uL (ref 0.7–4.0)
MCHC: 31.6 g/dL (ref 30.0–36.0)
MCV: 78.3 fl (ref 78.0–100.0)
Monocytes Absolute: 0.9 10*3/uL (ref 0.1–1.0)
Monocytes Relative: 14.7 % — ABNORMAL HIGH (ref 3.0–12.0)
Neutro Abs: 4.4 10*3/uL (ref 1.4–7.7)
Neutrophils Relative %: 68.4 % (ref 43.0–77.0)
Platelets: 284 10*3/uL (ref 150.0–400.0)
RBC: 3.27 Mil/uL — ABNORMAL LOW (ref 3.87–5.11)
RDW: 20.6 % — ABNORMAL HIGH (ref 11.5–15.5)
WBC: 6.4 10*3/uL (ref 4.0–10.5)

## 2019-05-11 LAB — COMPREHENSIVE METABOLIC PANEL
ALT: 9 U/L (ref 0–35)
AST: 13 U/L (ref 0–37)
Albumin: 3.5 g/dL (ref 3.5–5.2)
Alkaline Phosphatase: 59 U/L (ref 39–117)
BUN: 19 mg/dL (ref 6–23)
CO2: 31 mEq/L (ref 19–32)
Calcium: 9.2 mg/dL (ref 8.4–10.5)
Chloride: 97 mEq/L (ref 96–112)
Creatinine, Ser: 1.14 mg/dL (ref 0.40–1.20)
GFR: 45.41 mL/min — ABNORMAL LOW (ref >60.00)
Glucose, Bld: 113 mg/dL — ABNORMAL HIGH (ref 70–99)
Potassium: 3.6 mEq/L (ref 3.5–5.1)
Sodium: 135 mEq/L (ref 135–145)
Total Bilirubin: 0.3 mg/dL (ref 0.2–1.2)
Total Protein: 6.7 g/dL (ref 6.0–8.3)

## 2019-05-11 NOTE — Telephone Encounter (Signed)
Reviewed with patient /.daughter

## 2019-05-11 NOTE — Assessment & Plan Note (Signed)
Recurrent right pleural effusion with previous cytology negative for malignant cells.  Patient is status post Pleurx catheter.  This was removed the end of May 2020.  Of note right pleural effusion has returned and is moderate size.  For now we will continue to monitor.  Hold on thoracentesis or Pleurx catheter.  Will repeat chest x-ray on return.  And follow serial scans that is completed by oncology. If dyspnea worsens can reevaluate and consider therapeutic thoracentesis or Pleurx if indicated  Plan  Patient Instructions  Continue on Stiolto 2 puffs daily , rinse after use .  Continue on Oxygen 2l/m .  Labs today .  Follow up with Dr. Lamonte Sakai in 3 months and As needed   Please contact office for sooner follow up if symptoms do not improve or worsen or seek emergency care

## 2019-05-11 NOTE — Telephone Encounter (Signed)
I reviewed w TP. Overall appear to be stable.

## 2019-05-11 NOTE — Assessment & Plan Note (Signed)
Appears stable without exacerbation.  Patient does have ongoing dyspnea which I suspect is multifactorial with underlying moderate to severe COPD, diastolic dysfunction, ovarian cancer undergoing chemotherapy with symptomatic anemia.  She also has recurrent right pleural effusion that also may be contributing to her dyspnea. For now we will continue on her current COPD treatment regimen follow effusion serially through scans/x-ray Hold on thoracentesis or Pleurx for now.  Plan  Patient Instructions  Continue on Stiolto 2 puffs daily , rinse after use .  Continue on Oxygen 2l/m .  Labs today .  Follow up with Dr. Lamonte Sakai in 3 months and As needed   Please contact office for sooner follow up if symptoms do not improve or worsen or seek emergency care

## 2019-05-11 NOTE — Assessment & Plan Note (Signed)
Recent symptomatic anemia requiring 1 unit of packed red blood cells.  Patient has no known active bleeding.  She is on anticoagulation with Xarelto.  Will check labs today with CBC and bmet

## 2019-05-11 NOTE — Patient Instructions (Signed)
Continue on Stiolto 2 puffs daily , rinse after use .  Continue on Oxygen 2l/m .  Labs today .  Follow up with Dr. Lamonte Sakai in 3 months and As needed   Please contact office for sooner follow up if symptoms do not improve or worsen or seek emergency care

## 2019-05-11 NOTE — Assessment & Plan Note (Signed)
Continue on oxygen 2 L.  Goal O2 saturations greater than 90%

## 2019-05-11 NOTE — Progress Notes (Signed)
_0  ID: [name], female    DOB: [birth year], 85 y.o.   MRN: [id number]  Chief Complaint  Patient presents with  . Follow-up    COPD     Referring provider: Asencion Noble, MD  HPI: 85 year old female former smoker followed for COPD, chronic rhinitis and chronic cough.  Recurrent right pleural effusion previous Pleurx catheter-removed May 2020 (cytology neg for malignant cells  Medical history significant for ovarian cancer undergoing current treatment with chemotherapy.  History of psoriatic arthritis previously on methotrexate, atrial fibrillation, diastolic heart failure  TEST/EVENTS :  CT chest February 2019 decrease in right pleural effusion with resolution of right atelectasis, small left pleural effusion PET scan May 2019 no findings for metastatic disease in the chest, moderate size right pleural effusion, small left pleural effusion, persistent hazy omental nodularity but no hypermetabolism or discrete measurable nodules.  No abdominal ascites.  PFTs October 2019 showed FEV1 52%, ratio 54, FVC 72%, no significant bronchodilator response  CT abdomen and pelvis Feb 09, 2019, new mild ascites, mild peritoneal thickening with no significant change consistent with peritoneal carcinomatosis.  Increase tiny bilateral pleural effusions.  CT chest April 26, 2019 no evidence of right pneumothorax or left apical nodules.  Moderate right pleural effusion, small left pleural effusion with associated passive atelectasis of the lower lobes.  Moderate bronchiectasis.  Minimal perihepatic ascites.  05/11/2019 Follow up : COPD , CR, Right Pleural Effusion  Patient returns for a 50-monthfollow-up.  She has underlying moderate to severe COPD.  Is maintained on Stiolto daily.  Uses oxygen 2 L.  Says she uses oxygen most of the day sometimes takes it off when she is at rest.  Says overall breathing is doing about the same.  She gets short of breath with minimal activity.  Has minimum cough.  Patient does have known ovarian cancer currently on chemotherapy.  Has recently been hospitalized for symptomatic anemia required 1 unit of packed red blood cells.  Patient says she still feels very tired.  She has no known active bleeding.  Would like her blood work to be checked today.  Hemoglobin at discharge was 8.6. Patient is on Xarelto for known atrial fibrillation.  Patient has had a recurrent right pleural effusion.  She required a Pleurx catheter for intermittent drainage.  She is been followed by thoracic surgery.  Previous cytology was negative for malignant cells.  Pleurx was removed and of May 2020.  Recent CT chest April 26, 2019 showed a moderate right pleural effusion and small left pleural effusion.  Emphysematous changes.  She denies any fever congestion orthopnea or increased leg swelling.   Allergies  Allergen Reactions  . Codeine Itching    Immunization History  Administered Date(s) Administered  . Influenza Split 07/09/2011, 08/19/2013, 06/26/2014  . Influenza Whole 07/08/2009, 07/08/2010, 07/10/2012  . Influenza, High Dose Seasonal PF 07/09/2017  . Influenza,inj,Quad PF,6+ Mos 07/09/2015, 07/07/2016  . Influenza-Unspecified 07/08/2018  . Pneumococcal Conjugate-13 07/26/2014  . Pneumococcal Polysaccharide-23 07/08/2008    Past Medical History:  Diagnosis Date  . Anxiety   . Chronic blood loss anemia    03-04-2018 diverticular bleed and rectal bleeding,  transfused 2 units PRBCs 03-08-2018  . Colitis   . COPD (chronic obstructive pulmonary disease) (HCC)    Dr. BLamonte Sakai . Depression   . Diastolic CHF, chronic ([place]   . Diverticulosis   . Family history of colon cancer   . Fibromyalgia   . Genetic testing 04/07/2018   MyRisk (35  genes) @ [facility] - No pathogenic mutations detected  . GERD (gastroesophageal reflux disease)   . Hemorrhoids   . Hiatal hernia   . Hip pain 07/2018   Right Hip Pain  . History of rectal polyps   . History of shingles   .  Hypothyroidism   . IBS (irritable bowel syndrome)   . Lymphocytic colitis    Dr. Henrene Pastor  . Malignant ascites    Admission 06/2017 abdominal s/p parencentesis 07-01-2017 2.5L, 07-08-2016  2.7L, 07-12-2017  1448m  . [place] to chemotherapeutic drug ([place]   . On home O2   . Osteoporosis   . Ovarian cancer ([place]    Chemotherapy - Dr. GAlvy Bimler . Paroxysmal atrial fibrillation ([place]    Xarelto stopped 03-07-2018 due to lower GI bleed  . Pleural effusion    s/p  right thoracentesis, 02-2018 1.3L and 03-17-2018 right thoracentesis 6417m, post cxr no residual effusion  . Psoriatic arthritis ([place]  . Schatzki's ring 2013  . Seasonal allergic rhinitis     Tobacco History: Social History   Tobacco Use  Smoking Status Former Smoker  . Packs/day: 1.00  . Years: 20.00  . Pack years: 20.00  . Types: Cigarettes  . Quit date: 10/08/1980  . Years since quitting: 38.6  Smokeless Tobacco Never Used   Counseling given: Not Answered   Outpatient Medications Prior to Visit  Medication Sig Dispense Refill  . albuterol (PROAIR HFA) 108 (90 Base) MCG/ACT inhaler Inhale 2 puffs into the lungs every 6 (six) hours as needed for wheezing or shortness of breath. 18 g 5  . ALPRAZolam (XANAX) 0.25 MG tablet Take 1 tablet (0.25 mg total) by mouth at bedtime as needed for anxiety. 5 tablet 0  . azelastine (ASTELIN) 0.1 % nasal spray Place 2 sprays into both nostrils 2 (two) times daily as needed for allergies.     . benzonatate (TESSALON) 100 MG capsule Take 1 capsule (100 mg total) by mouth every 6 (six) hours as needed for cough. 30 capsule 0  . Cholecalciferol (VITAMIN D) 2000 units CAPS Take 2,000 Units by mouth daily.     . Marland Kitchenicyclomine (BENTYL) 10 MG capsule TAKE 1 CAPSULE(10 MG) BY MOUTH TWICE DAILY 60 capsule 1  . diltiazem (CARDIZEM CD) 180 MG 24 hr capsule Take 1 capsule (180 mg total) by mouth 2 (two) times a day. 180 capsule 3  . diphenhydrAMINE (BENADRYL) 25 mg capsule Take 25 mg by mouth  every 8 (eight) hours as needed for itching.     . diphenoxylate-atropine (LOMOTIL) 2.5-0.025 MG tablet Take 1 tablet by mouth 4 (four) times daily. (Patient taking differently: Take 1 tablet by mouth 4 (four) times daily as needed for diarrhea or loose stools. ) 360 tablet 1  . DULoxetine (CYMBALTA) 60 MG capsule Take 1 capsule (60 mg total) by mouth at bedtime. 30 capsule 3  . fluticasone (FLONASE) 50 MCG/ACT nasal spray Place 2 sprays into both nostrils 2 (two) times daily as needed (FOR NASAL CONGESTION.).     . Marland Kitchenurosemide (LASIX) 40 MG tablet Take 1 tablet (40 mg total) by mouth daily. 90 tablet 3  . halobetasol (ULTRAVATE) 0.05 % cream Apply 1 application topically 2 (two) times daily as needed (psoriasis).     . Marland Kitchenevothyroxine (SYNTHROID, LEVOTHROID) 137 MCG tablet Take 137 mcg by mouth daily before breakfast. For thyroid therapy    . Magnesium 400 MG CAPS Take 400 mg by mouth 2 (two) times a day.     .Marland Kitchen  metoprolol tartrate (LOPRESSOR) 25 MG tablet Take 1 tablet (25 mg total) by mouth 2 (two) times daily as needed. 180 tablet 3  . mirtazapine (REMERON) 15 MG tablet Take 1 tablet (15 mg total) by mouth at bedtime. 30 tablet 9  . oxyCODONE (ROXICODONE) 15 MG immediate release tablet Take 1 tablet (15 mg total) by mouth every 4 (four) hours as needed for pain. 60 tablet 0  . polyvinyl alcohol (LIQUIFILM TEARS) 1.4 % ophthalmic solution Place 1 drop into both eyes 2 (two) times daily as needed for dry eyes.     . potassium chloride SA (K-DUR,KLOR-CON) 20 MEQ tablet TAKE 1 TABLET BY MOUTH DAILY AND TAKE 1 ADDITIONAL TABLET WHEN TAKING EXTRA LASIX DOSE (Patient taking differently: Take 20 mEq by mouth daily. ) 60 tablet 6  . STIOLTO RESPIMAT 2.5-2.5 MCG/ACT AERS INHALE 2 PUFFS INTO THE LUNGS DAILY (Patient taking differently: Inhale 2 puffs into the lungs daily. ) 1 g 3  . XARELTO 20 MG TABS tablet TAKE 1 TABLET BY MOUTH EVERY DAY AFTER SUPPER (Patient taking differently: Take 20 mg by mouth daily with  supper. ) 90 tablet 3   Facility-Administered Medications Prior to Visit  Medication Dose Route Frequency Provider Last Rate Last Dose  . 0.9 %  sodium chloride infusion (Manually program via Guardrails IV Fluids)  250 mL Intravenous Once Heath Lark, MD   Stopped at 12/16/18 1500     Review of Systems:   Constitutional:   No  weight loss, night sweats,  Fevers, chills, + fatigue, or  lassitude.  HEENT:   No headaches,  Difficulty swallowing,  Tooth/dental problems, or  Sore throat,                No sneezing, itching, ear ache, nasal congestion, post nasal drip,   CV:  No chest pain,  Orthopnea, PND, swelling in lower extremities, anasarca, dizziness, palpitations, syncope.   GI  No heartburn, indigestion, abdominal pain, nausea, vomiting, diarrhea, change in bowel habits, loss of appetite, bloody stools.   Resp:    No chest wall deformity  Skin: no rash or lesions.  GU: no dysuria, change in color of urine, no urgency or frequency.  No flank pain, no hematuria   MS:  No joint pain or swelling.  No decreased range of motion.  No back pain.    Physical Exam  BP 130/82 (BP Location: Left Arm, Cuff Size: Normal)   Pulse 92   Ht _0  (1.626 m)   Wt 172 lb (78 kg)   SpO2 96%   BMI 29.52 kg/m   GEN: A/Ox3; pleasant , NAD, elderly, frail, wheelchair, oxygen   HEENT:  [place]/AT,  NOSE-clear, THROAT-clear, no lesions, no postnasal drip or exudate noted.   NECK:  Supple w/ fair ROM; no JVD; normal carotid impulses w/o bruits; no thyromegaly or nodules palpated; no lymphadenopathy.    RESP diminished breath sounds right greater than left  no accessory muscle use, no dullness to percussion  CARD:  RRR, no m/r/g, no peripheral edema, pulses intact, no cyanosis or clubbing.  GI:   Soft & nt; nml bowel sounds; no organomegaly or masses detected.   Musco: Warm bil, no deformities or joint swelling noted.   Neuro: alert, no focal deficits noted.    Skin: Warm, no lesions or  rashes    Lab Results:  CBC    Component Value Date/Time   WBC 7.2 04/30/2019 1017   RBC 3.51 (L) 04/30/2019 1017  HGB 8.6 (L) 04/30/2019 1017   HGB 10.1 (L) 03/13/2018 0940   HGB 10.8 (L) 09/24/2017 0945   HCT 28.8 (L) 04/30/2019 1017   HCT 32.8 (L) 09/24/2017 0945   PLT 253 04/30/2019 1017   PLT 163 03/13/2018 0940   PLT 174 09/24/2017 0945   MCV 82.1 04/30/2019 1017   MCV 93.2 09/24/2017 0945   MCH 24.5 (L) 04/30/2019 1017   MCHC 29.9 (L) 04/30/2019 1017   RDW 18.1 (H) 04/30/2019 1017   RDW 18.7 (H) 09/24/2017 0945   LYMPHSABS 1.4 04/30/2019 1017   LYMPHSABS 0.9 09/24/2017 0945   MONOABS 1.1 (H) 04/30/2019 1017   MONOABS 0.1 09/24/2017 0945   EOSABS 0.3 04/30/2019 1017   EOSABS 0.1 09/24/2017 0945   BASOSABS 0.0 04/30/2019 1017   BASOSABS 0.0 09/24/2017 0945    BMET    Component Value Date/Time   NA 136 04/30/2019 1017   NA 135 (L) 09/24/2017 0945   K 3.9 04/30/2019 1017   K 3.7 09/24/2017 0945   CL 98 04/30/2019 1017   CO2 31 04/30/2019 1017   CO2 30 (H) 09/24/2017 0945   GLUCOSE 109 (H) 04/30/2019 1017   GLUCOSE 101 09/24/2017 0945   BUN 17 04/30/2019 1017   BUN 15.3 09/24/2017 0945   CREATININE 1.17 (H) 04/30/2019 1017   CREATININE 0.8 09/24/2017 0945   CALCIUM 9.1 04/30/2019 1017   CALCIUM 9.1 09/24/2017 0945   GFRNONAA 43 (L) 04/30/2019 1017   GFRAA 50 (L) 04/30/2019 1017    BNP    Component Value Date/Time   BNP 115.0 (H) 04/17/2019 0918   BNP 51.2 09/05/2015 1118    ProBNP    Component Value Date/Time   PROBNP 837 (H) 08/12/2017 1137    Imaging: Dg Chest 2 View  Result Date: 04/26/2019 CLINICAL DATA:  Chest discomfort, hx of a-fib, low O2 sat at home- pt undergoing chemo for ovarian cancer EXAM: CHEST - 2 VIEW COMPARISON:  04/17/2019 FINDINGS: Power injectable right IJ Port-A-Cath tip: SVC. Atherosclerotic calcification of the aortic arch. Heart size within normal limits. Blunting of both costophrenic angles compatible with pleural  effusions. Subtle subpleural lucency along the right lung apex; although this may be from a skin wrinkle, I cannot completely exclude a small apical pneumothorax. New nodularity in the left lung apex including a 1.3 cm pulmonary nodule projecting of the left first rib and perhaps another adjacent nodule. Thoracic spondylosis. Linear subsegmental atelectasis at the left lung base. IMPRESSION: 1. I cannot exclude a tiny right apical pneumothorax. Also, there are potential new pulmonary nodules at the left lung apex. Chest CT is recommended for further characterization. 2. Small bilateral pleural effusions. 3.  Aortic Atherosclerosis (ICD10-I70.0). Electronically Signed   By: Van Clines M.D.   On: 04/26/2019 15:22   Ct Chest Wo Contrast  Result Date: 04/26/2019 CLINICAL DATA:  85 year old presenting with current history of ovarian cancer for which she is undergoing chemotherapy, current history of atrial fibrillation, presenting with chest discomfort. Abnormal chest x-ray earlier today questioning a RIGHT apically pneumothorax and LEFT apically lung nodules. EXAM: CT CHEST WITHOUT CONTRAST TECHNIQUE: Multidetector CT imaging of the chest was performed following the standard protocol without IV contrast. COMPARISON:  No prior chest CT. FINDINGS: Cardiovascular: Heart size upper normal to slightly enlarged. Severe three-vessel coronary atherosclerosis. RIGHT jugular Port-A-Cath tip at the cavoatrial junction. No pericardial effusion. Moderate to severe atherosclerosis involving the thoracic and proximal abdominal aorta without evidence of aneurysm. Mediastinum/Nodes: Numerous normal sized lymph nodes  throughout the mediastinum. No pathologic lymphadenopathy. Fluid in the mid portion of the normal appearing esophagus without evidence of hiatal hernia. Mild thyromegaly without nodularity. Lungs/Pleura: No evidence of a RIGHT pneumothorax or LEFT apical nodules as questioned on the earlier chest x-ray.  Emphysematous changes in both lungs. No parenchymal nodules or masses to suggest metastatic disease. Moderately large RIGHT pleural effusion and associated consolidation in the RIGHT LOWER LOBE. Small LEFT pleural effusion and associated mild consolidation in the LEFT LOWER LOBE. Moderate BILATERAL lower lobe and mild BILATERAL upper lobe bronchiectasis. Central airways patent with moderate to marked bronchial wall thickening. Upper Abdomen: Minimal perihepatic ascites. Visualized upper abdomen otherwise unremarkable for the unenhanced technique. Musculoskeletal: Osseous demineralization. Benign bone island in the T11 vertebral body. Degenerative changes and DISH involving the mid and LOWER thoracic spine. No acute findings. IMPRESSION: 1. No evidence of a RIGHT pneumothorax or LEFT apical nodules as questioned on the earlier chest x-ray. 2. Moderately large RIGHT pleural effusion and small LEFT pleural effusion with associated passive atelectasis (favored over pneumonia) in the lower lobes. 3. Moderate to marked central bronchial wall thickening indicating asthma and/or bronchitis. 4. Moderate bilateral lower lobe and mild bilateral upper lobe bronchiectasis. 5. Minimal perihepatic ascites. Aortic Atherosclerosis (ICD10-I70.0) and Emphysema (ICD10-J43.9). Electronically Signed   By: Evangeline Dakin M.D.   On: 04/26/2019 16:19   Dg Chest Port 1 View  Result Date: 04/17/2019 CLINICAL DATA:  85 year old with palpitations. EXAM: PORTABLE CHEST 1 VIEW COMPARISON:  03/30/2019 FINDINGS: Stable position of the right jugular Port-A-Cath. Catheter tip in the lower SVC region. Chronic blunting at the lung bases is unchanged. No focal airspace disease or pulmonary edema. Heart size is stable and within normal limits. Atherosclerotic calcifications at the aortic arch. Bone structures are unremarkable. IMPRESSION: No acute cardiopulmonary disease. Electronically Signed   By: Markus Daft M.D.   On: 04/17/2019 09:51     acetaminophen (TYLENOL) tablet 650 mg    Date Action Dose Route User   Discharged on 04/27/2019   Admitted on 04/26/2019   Discharged on 04/17/2019   Admitted on 04/17/2019   03/30/2019 1221 Given 650 mg Oral Rolene Course, RN    dexamethasone (DECADRON) 20 mg in sodium chloride 0.9 % 50 mL IVPB    Date Action Dose Route User   Discharged on 04/27/2019   Admitted on 04/26/2019   Discharged on 04/17/2019   Admitted on 04/17/2019   03/16/2019 1051 Rate/Dose Verify (none) (none) Sinda Du, RN   03/16/2019 1031 Rate/Dose Verify (none) (none) Sinda Du, RN   03/16/2019 1031 New Bag/Given 20 mg Intravenous Lillia Corporal, RN    dexamethasone (DECADRON) 20 mg in sodium chloride 0.9 % 50 mL IVPB    Date Action Dose Route User   Discharged on 04/27/2019   Admitted on 04/26/2019   Discharged on 04/17/2019   Admitted on 04/17/2019   03/30/2019 1100 Rate/Dose Verify (none) (none) Thomasene Lot, RN   03/30/2019 1056 Rate/Dose Verify (none) (none) Thomasene Lot, RN   03/30/2019 1049 Rate/Dose Verify (none) (none) Thomasene Lot, RN   03/30/2019 1041 Rate/Dose Verify (none) (none) Thomasene Lot, RN   03/30/2019 1041 New Bag/Given 20 mg Intravenous Thomasene Lot, RN    dexamethasone (DECADRON) 20 mg in sodium chloride 0.9 % 50 mL IVPB    Date Action Dose Route User   Discharged on 04/27/2019   Admitted on 04/26/2019   Discharged on 04/17/2019   Admitted on 04/17/2019  04/13/2019 1134 Rate/Dose Verify (none) (none) Teodoro Spray, RN   04/13/2019 1134 Rate/Dose Change (none) (none) Teodoro Spray, RN   04/13/2019 1133 New Bag/Given 20 mg Intravenous Heller, Antony Contras, RN    dexamethasone (DECADRON) 20 mg in sodium chloride 0.9 % 50 mL IVPB    Date Action Dose Route User   05/01/2019 0850 Rate/Dose Change (none) (none) Scot Dock, RN   05/01/2019 0850 New Bag/Given 20 mg Intravenous Scot Dock, RN    diphenhydrAMINE (BENADRYL) injection 50 mg    Date Action Dose  Route User   Discharged on 04/27/2019   Admitted on 04/26/2019   Discharged on 04/17/2019   Admitted on 04/17/2019   03/16/2019 1002 Given 50 mg Intravenous Lillia Corporal, RN    diphenhydrAMINE (BENADRYL) injection 50 mg    Date Action Dose Route User   Discharged on 04/27/2019   Admitted on 04/26/2019   Discharged on 04/17/2019   Admitted on 04/17/2019   03/30/2019 1010 Given 50 mg Intravenous Rolene Course, RN    diphenhydrAMINE (BENADRYL) injection 50 mg    Date Action Dose Route User   Discharged on 04/27/2019   Admitted on 04/26/2019   Discharged on 04/17/2019   Admitted on 04/17/2019   04/13/2019 1107 Given 50 mg Intravenous Heller, Antony Contras, RN    diphenhydrAMINE (BENADRYL) injection 50 mg    Date Action Dose Route User   05/01/2019 0828 Given 50 mg Intravenous Scot Dock, RN    famotidine (PEPCID) IVPB 20 mg premix    Date Action Dose Route User   Discharged on 04/27/2019   Admitted on 04/26/2019   Discharged on 04/17/2019   Admitted on 04/17/2019   03/16/2019 1024 Rate/Dose Verify (none) (none) Sinda Du, RN   03/16/2019 1024 Rate/Dose Verify (none) (none) Sinda Du, RN   03/16/2019 1009 Rate/Dose Verify (none) (none) Sinda Du, RN   03/16/2019 1008 New Bag/Given 20 mg Intravenous Lillia Corporal, RN    famotidine (PEPCID) IVPB 20 mg premix    Date Action Dose Route User   Discharged on 04/27/2019   Admitted on 04/26/2019   Discharged on 04/17/2019   Admitted on 04/17/2019   03/30/2019 1014 Rate/Dose Change (none) (none) Thomasene Lot, RN   03/30/2019 1014 New Bag/Given 20 mg Intravenous Rolene Course, RN    famotidine (PEPCID) IVPB 20 mg premix    Date Action Dose Route User   Discharged on 04/27/2019   Admitted on 04/26/2019   Discharged on 04/17/2019   Admitted on 04/17/2019   04/13/2019 1111 Rate/Dose Change (none) (none) Teodoro Spray, RN   04/13/2019 1111 New Bag/Given 20 mg Intravenous Teodoro Spray, RN    famotidine (PEPCID) IVPB 20 mg  premix    Date Action Dose Route User   05/01/2019 0835 Rate/Dose Change (none) (none) Scot Dock, RN   05/01/2019 0835 New Bag/Given 20 mg Intravenous Scot Dock, RN    heparin lock flush 100 unit/mL    Date Action Dose Route User   Discharged on 04/27/2019   Admitted on 04/26/2019   Discharged on 04/17/2019   Admitted on 04/17/2019   03/16/2019 1227 Given 500 Units Intracatheter Lillia Corporal, RN    heparin lock flush 100 unit/mL    Date Action Dose Route User   Discharged on 04/27/2019   Admitted on 04/26/2019   Discharged on 04/17/2019   Admitted on 04/17/2019   03/30/2019 1501 Given 500 Units  Intracatheter Rolene Course, RN    heparin lock flush 100 unit/mL    Date Action Dose Route User   Discharged on 04/27/2019   Admitted on 04/26/2019   Discharged on 04/17/2019   Admitted on 04/17/2019   04/13/2019 1312 Given 500 Units Intracatheter Heller, Antony Contras, RN    heparin lock flush 100 unit/mL    Date Action Dose Route User   05/01/2019 1041 Given 500 Units Intracatheter Scot Dock, RN    PACLitaxel (TAXOL) 126 mg in sodium chloride 0.9 % 250 mL chemo infusion (</= 40m/m2)    Date Action Dose Route User   Discharged on 04/27/2019   Admitted on 04/26/2019   Discharged on 04/17/2019   Admitted on 04/17/2019   03/16/2019 1123 Rate/Dose Verify (none) (none) ALillia Corporal RN   03/16/2019 1123 Rate/Dose Change (none) (none) ALillia Corporal RN   03/16/2019 1122 New Bag/Given 126 mg Intravenous NSinda Du RN    PACLitaxel (TAXOL) 126 mg in sodium chloride 0.9 % 250 mL chemo infusion (</= 849mm2)    Date Action Dose Route User   Discharged on 04/27/2019   Admitted on 04/26/2019   Discharged on 04/17/2019   Admitted on 04/17/2019   03/30/2019 1229 Rate/Dose Change (none) (none) RiThomasene LotRN   03/30/2019 1229 Rate/Dose Change (none) (none) RiThomasene LotRN   03/30/2019 1131 Rate/Dose Verify (none) (none) RiThomasene LotRN   03/30/2019 1129 New Bag/Given 126  mg Intravenous RiThomasene LotRN    PACLitaxel (TAXOL) 126 mg in sodium chloride 0.9 % 250 mL chemo infusion (</= 8063m2)    Date Action Dose Route User   Discharged on 04/27/2019   Admitted on 04/26/2019   Discharged on 04/17/2019   Admitted on 04/17/2019   04/13/2019 1300 Rate/Dose Change (none) (none) HelTeodoro SprayN   04/13/2019 1300 Rate/Dose Change (none) (none) HelTeodoro SprayN   04/13/2019 1201 Rate/Dose Verify (none) (none) HelTeodoro SprayN   04/13/2019 1200 New Bag/Given 126 mg Intravenous AmmPaulla DollyN    PACLitaxel (TAXOL) 126 mg in sodium chloride 0.9 % 250 mL chemo infusion (</= 31m6m)    Date Action Dose Route User   05/01/2019 1033 Rate/Dose Change (none) (none) MuzaScot Dock   05/01/2019 1033 Rate/Dose Change (none) (none) MuzaScot Dock   05/01/2019 0933 Rate/Dose Change (none) (none) MuzaScot Dock   05/01/2019 0933 New Bag/Given 126 mg Intravenous MuzaScot Dock    0.9 %  sodium chloride infusion    Date Action Dose Route User   Discharged on 04/27/2019   Admitted on 04/26/2019   Discharged on 04/17/2019   Admitted on 04/17/2019   03/16/2019 1123 Rate/Dose Verify (none) (none) AldrLillia Corporal   03/16/2019 1117 Rate/Dose Verify (none) (none) NichSinda Du   03/16/2019 1056 Restarted (none) (none) NichSinda Du   03/16/2019 1056 Rate/Dose Change (none) (none) NichSinda Du   03/16/2019 1051 Rate/Dose Change (none) (none) NichSinda Du    0.9 %  sodium chloride infusion    Date Action Dose Route User   Discharged on 04/27/2019   Admitted on 04/26/2019   Discharged on 04/17/2019   Admitted on 04/17/2019   03/30/2019 1453 Rate/Dose Change (none) (none) LoweRolene Course   03/30/2019 1453 Rate/Dose Change (none) (none) LoweRolene Course   03/30/2019 1452 Rate/Dose Change (none) (none) LoweRolene Course  03/30/2019 1451 Rate/Dose Change (none) (none) Rolene Course, RN   03/30/2019 1444 Restarted (none)  (none) Rolene Course, RN    0.9 %  sodium chloride infusion    Date Action Dose Route User   Discharged on 04/27/2019   Admitted on 04/26/2019   Discharged on 04/17/2019   Admitted on 04/17/2019   04/13/2019 1311 Rate/Dose Change (none) (none) Teodoro Spray, RN   04/13/2019 1307 Rate/Dose Change (none) (none) Teodoro Spray, RN   04/13/2019 1222 Rate/Dose Verify (none) (none) Teodoro Spray, RN   04/13/2019 1152 Rate/Dose Verify (none) (none) Teodoro Spray, RN   04/13/2019 1151 Rate/Dose Change (none) (none) Heller, Antony Contras, RN    0.9 %  sodium chloride infusion    Date Action Dose Route User   05/01/2019 1040 Rate/Dose Change (none) (none) Scot Dock, RN   05/01/2019 1035 Rate/Dose Change (none) (none) Scot Dock, RN   05/01/2019 0908 Rate/Dose Verify (none) (none) Scot Dock, RN   05/01/2019 0908 Rate/Dose Change (none) (none) Scot Dock, RN   05/01/2019 7494 Restarted (none) (none) Scot Dock, RN    sodium chloride flush (NS) 0.9 % injection 10 mL    Date Action Dose Route User   Discharged on 04/27/2019   Admitted on 04/26/2019   Discharged on 04/17/2019   Admitted on 04/17/2019   03/16/2019 4967 Given 10 mL Intracatheter Quentin Cornwall, Chasidy H, LPN    sodium chloride flush (NS) 0.9 % injection 10 mL    Date Action Dose Route User   Discharged on 04/27/2019   Admitted on 04/26/2019   Discharged on 04/17/2019   Admitted on 04/17/2019   03/16/2019 1227 Given 10 mL Intracatheter Lillia Corporal, RN    sodium chloride flush (NS) 0.9 % injection 10 mL    Date Action Dose Route User   Discharged on 04/27/2019   Admitted on 04/26/2019   Discharged on 04/17/2019   Admitted on 04/17/2019   03/30/2019 0855 Given 10 mL Intracatheter Britt Boozer N, LPN    sodium chloride flush (NS) 0.9 % injection 10 mL    Date Action Dose Route User   Discharged on 04/27/2019   Admitted on 04/26/2019   Discharged on 04/17/2019   Admitted on 04/17/2019   03/30/2019 1501 Given 10 mL  Intracatheter Rolene Course, RN    sodium chloride flush (NS) 0.9 % injection 10 mL    Date Action Dose Route User   Discharged on 04/27/2019   Admitted on 04/26/2019   Discharged on 04/17/2019   Admitted on 04/17/2019   04/13/2019 5916 Given 10 mL Intracatheter Quentin Cornwall, Chasidy H, LPN    sodium chloride flush (NS) 0.9 % injection 10 mL    Date Action Dose Route User   Discharged on 04/27/2019   Admitted on 04/26/2019   Discharged on 04/17/2019   Admitted on 04/17/2019   04/13/2019 1312 Given 10 mL Intracatheter Heller, Antony Contras, RN    sodium chloride flush (NS) 0.9 % injection 10 mL    Date Action Dose Route User   04/30/2019 1056 Given 10 mL Intracatheter Robinson, Chasidy H, LPN    sodium chloride flush (NS) 0.9 % injection 10 mL    Date Action Dose Route User   05/01/2019 1041 Given 10 mL Intracatheter Scot Dock, RN    0.9 %  sodium chloride infusion (Manually program via Guardrails IV Fluids)    Date Action Dose Route User   Discharged on 04/27/2019  Admitted on 04/26/2019   Discharged on 04/17/2019   Admitted on 04/17/2019   03/30/2019 1243 New Bag/Given 250 mL Intravenous Ricks, Janalyn Harder, RN      PFT Results Latest Ref Rng & Units 07/24/2018  FVC-Pre L 1.77  FVC-Predicted Pre % 71  FVC-Post L 1.80  FVC-Predicted Post % 72  Pre FEV1/FVC % % 53  Post FEV1/FCV % % 54  FEV1-Pre L 0.94  FEV1-Predicted Pre % 51  FEV1-Post L 0.96    No results found for: NITRICOXIDE      Assessment & Plan:   COPD (chronic obstructive pulmonary disease) (HCC) Appears stable without exacerbation.  Patient does have ongoing dyspnea which I suspect is multifactorial with underlying moderate to severe COPD, diastolic dysfunction, ovarian cancer undergoing chemotherapy with symptomatic anemia.  She also has recurrent right pleural effusion that also may be contributing to her dyspnea. For now we will continue on her current COPD treatment regimen follow effusion serially through  scans/x-ray Hold on thoracentesis or Pleurx for now.  Plan  Patient Instructions  Continue on Stiolto 2 puffs daily , rinse after use .  Continue on Oxygen 2l/m .  Labs today .  Follow up with Dr. Lamonte Sakai in 3 months and As needed   Please contact office for sooner follow up if symptoms do not improve or worsen or seek emergency care       Pleural effusion Recurrent right pleural effusion with previous cytology negative for malignant cells.  Patient is status post Pleurx catheter.  This was removed the end of May 2020.  Of note right pleural effusion has returned and is moderate size.  For now we will continue to monitor.  Hold on thoracentesis or Pleurx catheter.  Will repeat chest x-ray on return.  And follow serial scans that is completed by oncology. If dyspnea worsens can reevaluate and consider therapeutic thoracentesis or Pleurx if indicated  Plan  Patient Instructions  Continue on Stiolto 2 puffs daily , rinse after use .  Continue on Oxygen 2l/m .  Labs today .  Follow up with Dr. Lamonte Sakai in 3 months and As needed   Please contact office for sooner follow up if symptoms do not improve or worsen or seek emergency care       Anemia, chronic disease Recent symptomatic anemia requiring 1 unit of packed red blood cells.  Patient has no known active bleeding.  She is on anticoagulation with Xarelto.  Will check labs today with CBC and bmet    Chronic respiratory failure with hypoxia (HCC) Continue on oxygen 2 L.  Goal O2 saturations greater than 90%     Rexene Edison, NP 05/11/2019   Attending Note:  I have examined patient, reviewed labs, studies and notes.   I discussed the case with T Yolander Goodie. Ms Silos has progressive DOE, likely multifactorial. Certainly her cancer is a contributor. Her R pleural effusion is now moderate size. ? Whether she will be a candidate for thora at some point in the future. We will check her CBC given the chronic anemia to insure this hasn't  worsened. Continue her BD regimen and O2 as ordered.     Baltazar Apo, MD, PhD 05/11/2019, 5:17 PM [facility] [phone] or if no answer [phone]

## 2019-05-14 ENCOUNTER — Other Ambulatory Visit: Payer: Self-pay

## 2019-05-14 DIAGNOSIS — R001 Bradycardia, unspecified: Secondary | ICD-10-CM

## 2019-05-15 ENCOUNTER — Telehealth: Payer: Self-pay | Admitting: Physician Assistant

## 2019-05-15 NOTE — Telephone Encounter (Signed)
Virtual Visit Pre-Appointment Phone Call  "(Name), I am calling you today to discuss your upcoming appointment. We are currently trying to limit exposure to the virus that causes COVID-19 by seeing patients at home rather than in the office."  1. "What is the BEST phone number to call the day of the visit?" - include this in appointment notes  2. Do you have or have access to (through a family member/friend) a smartphone with video capability that we can use for your visit?" a. If yes - list this number in appt notes as cell (if different from BEST phone #) and list the appointment type as a VIDEO visit in appointment notes b. If no - list the appointment type as a PHONE visit in appointment notes  3. Confirm consent - "In the setting of the current Covid19 crisis, you are scheduled for a (phone or video) visit with your provider on (date) at (time).  Just as we do with many in-office visits, in order for you to participate in this visit, we must obtain consent.  If you'd like, I can send this to your mychart (if signed up) or email for you to review.  Otherwise, I can obtain your verbal consent now.  All virtual visits are billed to your insurance company just like a normal visit would be.  By agreeing to a virtual visit, we'd like you to understand that the technology does not allow for your provider to perform an examination, and thus may limit your provider's ability to fully assess your condition. If your provider identifies any concerns that need to be evaluated in person, we will make arrangements to do so.  Finally, though the technology is pretty good, we cannot assure that it will always work on either your or our end, and in the setting of a video visit, we may have to convert it to a phone-only visit.  In either situation, we cannot ensure that we have a secure connection.  Are you willing to proceed?" STAFF: Did the patient verbally acknowledge consent to telehealth visit? Document  YES/NO here: yes  4. Advise patient to be prepared - "Two hours prior to your appointment, go ahead and check your blood pressure, pulse, oxygen saturation, and your weight (if you have the equipment to check those) and write them all down. When your visit starts, your provider will ask you for this information. If you have an Apple Watch or Kardia device, please plan to have heart rate information ready on the day of your appointment. Please have a pen and paper handy nearby the day of the visit as well."  5. Give patient instructions for MyChart download to smartphone OR [email or web address] as below if video visit (depending on what platform provider is using)  6. Inform patient they will receive a phone call 15 minutes prior to their appointment time (may be from unknown caller ID) so they should be prepared to answer    TELEPHONE CALL NOTE  [name] has been deemed a candidate for a follow-up tele-health visit to limit community exposure during the Covid-19 pandemic. I spoke with the patient via phone to ensure availability of phone/video source, confirm preferred email & phone number, and discuss instructions and expectations.  I reminded [name] to be prepared with any vital sign and/or heart rhythm information that could potentially be obtained via home monitoring, at the time of her visit. I reminded [name] to expect a phone call prior to  her visit.  [name] 05/15/2019 1:09 PM

## 2019-05-18 ENCOUNTER — Telehealth (INDEPENDENT_AMBULATORY_CARE_PROVIDER_SITE_OTHER): Payer: Medicare Other | Admitting: Physician Assistant

## 2019-05-18 ENCOUNTER — Encounter: Payer: Self-pay | Admitting: Physician Assistant

## 2019-05-18 ENCOUNTER — Other Ambulatory Visit: Payer: Self-pay

## 2019-05-18 VITALS — BP 121/62 | HR 71 | Ht 64.0 in | Wt 168.0 lb

## 2019-05-18 DIAGNOSIS — J449 Chronic obstructive pulmonary disease, unspecified: Secondary | ICD-10-CM

## 2019-05-18 DIAGNOSIS — I5033 Acute on chronic diastolic (congestive) heart failure: Secondary | ICD-10-CM

## 2019-05-18 DIAGNOSIS — I4891 Unspecified atrial fibrillation: Secondary | ICD-10-CM

## 2019-05-18 DIAGNOSIS — C562 Malignant neoplasm of left ovary: Secondary | ICD-10-CM | POA: Diagnosis not present

## 2019-05-18 NOTE — Patient Instructions (Signed)
Medication Instructions: Your physician recommends that you continue on your current medications as directed. Please refer to the Current Medication list given to you today.   Labwork: None  Procedures/Testing: None  Follow-Up:  As scheduled for Dr.McDowell on 06/09/19 at 1:20 pm in the [place] office  Any Additional Special Instructions Will Be Listed Below (If Applicable).     If you need a refill on your cardiac medications before your next appointment, please call your pharmacy.      Thank you for choosing [facility] !

## 2019-05-19 ENCOUNTER — Inpatient Hospital Stay: Payer: Medicare Other

## 2019-05-19 ENCOUNTER — Telehealth: Payer: Self-pay | Admitting: *Deleted

## 2019-05-19 ENCOUNTER — Other Ambulatory Visit: Payer: Self-pay

## 2019-05-19 ENCOUNTER — Inpatient Hospital Stay: Payer: Medicare Other | Attending: Hematology and Oncology

## 2019-05-19 ENCOUNTER — Inpatient Hospital Stay (HOSPITAL_BASED_OUTPATIENT_CLINIC_OR_DEPARTMENT_OTHER): Payer: Medicare Other | Admitting: Hematology and Oncology

## 2019-05-19 ENCOUNTER — Telehealth: Payer: Self-pay | Admitting: Hematology and Oncology

## 2019-05-19 ENCOUNTER — Other Ambulatory Visit: Payer: Self-pay | Admitting: Hematology and Oncology

## 2019-05-19 ENCOUNTER — Encounter: Payer: Self-pay | Admitting: Hematology and Oncology

## 2019-05-19 ENCOUNTER — Ambulatory Visit (HOSPITAL_COMMUNITY)
Admission: RE | Admit: 2019-05-19 | Discharge: 2019-05-19 | Disposition: A | Discharge status: Home / Self Care | Payer: Medicare Other | Source: Ambulatory Visit | Attending: Hematology and Oncology | Admitting: Hematology and Oncology

## 2019-05-19 VITALS — BP 123/48 | HR 73 | Temp 98.2°F | Resp 18 | Ht 64.0 in | Wt 174.1 lb

## 2019-05-19 DIAGNOSIS — I4891 Unspecified atrial fibrillation: Secondary | ICD-10-CM | POA: Diagnosis not present

## 2019-05-19 DIAGNOSIS — C562 Malignant neoplasm of left ovary: Secondary | ICD-10-CM | POA: Diagnosis not present

## 2019-05-19 DIAGNOSIS — G893 Neoplasm related pain (acute) (chronic): Secondary | ICD-10-CM | POA: Diagnosis not present

## 2019-05-19 DIAGNOSIS — J189 Pneumonia, unspecified organism: Secondary | ICD-10-CM

## 2019-05-19 DIAGNOSIS — N189 Chronic kidney disease, unspecified: Secondary | ICD-10-CM | POA: Insufficient documentation

## 2019-05-19 DIAGNOSIS — J181 Lobar pneumonia, unspecified organism: Secondary | ICD-10-CM

## 2019-05-19 DIAGNOSIS — I700 Atherosclerosis of aorta: Secondary | ICD-10-CM | POA: Insufficient documentation

## 2019-05-19 DIAGNOSIS — J90 Pleural effusion, not elsewhere classified: Secondary | ICD-10-CM | POA: Insufficient documentation

## 2019-05-19 DIAGNOSIS — Z79899 Other long term (current) drug therapy: Secondary | ICD-10-CM | POA: Insufficient documentation

## 2019-05-19 DIAGNOSIS — D649 Anemia, unspecified: Secondary | ICD-10-CM | POA: Insufficient documentation

## 2019-05-19 DIAGNOSIS — G629 Polyneuropathy, unspecified: Secondary | ICD-10-CM | POA: Insufficient documentation

## 2019-05-19 DIAGNOSIS — T451X5A Adverse effect of antineoplastic and immunosuppressive drugs, initial encounter: Secondary | ICD-10-CM | POA: Diagnosis not present

## 2019-05-19 DIAGNOSIS — Z7901 Long term (current) use of anticoagulants: Secondary | ICD-10-CM | POA: Insufficient documentation

## 2019-05-19 DIAGNOSIS — J479 Bronchiectasis, uncomplicated: Secondary | ICD-10-CM | POA: Diagnosis not present

## 2019-05-19 DIAGNOSIS — J439 Emphysema, unspecified: Secondary | ICD-10-CM | POA: Insufficient documentation

## 2019-05-19 DIAGNOSIS — D61810 Antineoplastic chemotherapy induced pancytopenia: Secondary | ICD-10-CM

## 2019-05-19 DIAGNOSIS — I129 Hypertensive chronic kidney disease with stage 1 through stage 4 chronic kidney disease, or unspecified chronic kidney disease: Secondary | ICD-10-CM | POA: Insufficient documentation

## 2019-05-19 DIAGNOSIS — N183 Chronic kidney disease, stage 3 unspecified: Secondary | ICD-10-CM

## 2019-05-19 DIAGNOSIS — D638 Anemia in other chronic diseases classified elsewhere: Secondary | ICD-10-CM

## 2019-05-19 DIAGNOSIS — R590 Localized enlarged lymph nodes: Secondary | ICD-10-CM | POA: Insufficient documentation

## 2019-05-19 DIAGNOSIS — R188 Other ascites: Secondary | ICD-10-CM | POA: Diagnosis not present

## 2019-05-19 DIAGNOSIS — Z5111 Encounter for antineoplastic chemotherapy: Secondary | ICD-10-CM | POA: Insufficient documentation

## 2019-05-19 LAB — CMP (CANCER CENTER ONLY)
ALT: 11 U/L (ref 0–44)
AST: 16 U/L (ref 15–41)
Albumin: 3.2 g/dL — ABNORMAL LOW (ref 3.5–5.0)
Alkaline Phosphatase: 56 U/L (ref 38–126)
Anion gap: 7 (ref 5–15)
BUN: 21 mg/dL (ref 8–23)
CO2: 31 mmol/L (ref 22–32)
Calcium: 8.7 mg/dL — ABNORMAL LOW (ref 8.9–10.3)
Chloride: 98 mmol/L (ref 98–111)
Creatinine: 1.11 mg/dL — ABNORMAL HIGH (ref 0.44–1.00)
GFR, Est AFR Am: 53 mL/min — ABNORMAL LOW (ref >60)
GFR, Estimated: 46 mL/min — ABNORMAL LOW (ref >60)
Glucose, Bld: 133 mg/dL — ABNORMAL HIGH (ref 70–99)
Potassium: 3.5 mmol/L (ref 3.5–5.1)
Sodium: 136 mmol/L (ref 135–145)
Total Bilirubin: 0.4 mg/dL (ref 0.3–1.2)
Total Protein: 6.7 g/dL (ref 6.5–8.1)

## 2019-05-19 LAB — SAMPLE TO BLOOD BANK

## 2019-05-19 LAB — CBC WITH DIFFERENTIAL/PLATELET
Abs Immature Granulocytes: 0.05 10*3/uL (ref 0.00–0.07)
Basophils Absolute: 0 10*3/uL (ref 0.0–0.1)
Basophils Relative: 0 %
Eosinophils Absolute: 0.2 10*3/uL (ref 0.0–0.5)
Eosinophils Relative: 3 %
HCT: 23.4 % — ABNORMAL LOW (ref 36.0–46.0)
Hemoglobin: 6.9 g/dL — CL (ref 12.0–15.0)
Immature Granulocytes: 1 %
Lymphocytes Relative: 19 %
Lymphs Abs: 1.3 10*3/uL (ref 0.7–4.0)
MCH: 23.6 pg — ABNORMAL LOW (ref 26.0–34.0)
MCHC: 29.5 g/dL — ABNORMAL LOW (ref 30.0–36.0)
MCV: 80.1 fL (ref 80.0–100.0)
Monocytes Absolute: 1 10*3/uL (ref 0.1–1.0)
Monocytes Relative: 15 %
Neutro Abs: 4.1 10*3/uL (ref 1.7–7.7)
Neutrophils Relative %: 62 %
Platelets: 248 10*3/uL (ref 150–400)
RBC: 2.92 MIL/uL — ABNORMAL LOW (ref 3.87–5.11)
RDW: 17.9 % — ABNORMAL HIGH (ref 11.5–15.5)
WBC: 6.7 10*3/uL (ref 4.0–10.5)
nRBC: 0 % (ref 0.0–0.2)

## 2019-05-19 LAB — PREPARE RBC (CROSSMATCH)

## 2019-05-19 MED ORDER — SODIUM CHLORIDE 0.9% IV SOLUTION
250.0000 mL | Freq: Once | INTRAVENOUS | Status: AC
  Administered 2019-05-19: 250 mL via INTRAVENOUS
  Filled 2019-05-19: qty 250

## 2019-05-19 MED ORDER — DIPHENHYDRAMINE HCL 25 MG PO CAPS
ORAL_CAPSULE | ORAL | Status: AC
  Filled 2019-05-19: qty 1

## 2019-05-19 MED ORDER — HEPARIN SOD (PORK) LOCK FLUSH 100 UNIT/ML IV SOLN
500.0000 [IU] | Freq: Every day | INTRAVENOUS | Status: AC | PRN
  Administered 2019-05-19: 500 [IU]
  Filled 2019-05-19: qty 5

## 2019-05-19 MED ORDER — SODIUM CHLORIDE 0.9% FLUSH
10.0000 mL | Freq: Once | INTRAVENOUS | Status: AC
  Administered 2019-05-19: 10 mL
  Filled 2019-05-19: qty 10

## 2019-05-19 MED ORDER — ACETAMINOPHEN 325 MG PO TABS
650.0000 mg | ORAL_TABLET | Freq: Once | ORAL | Status: AC
  Administered 2019-05-19: 650 mg via ORAL

## 2019-05-19 MED ORDER — SODIUM CHLORIDE 0.9% FLUSH
10.0000 mL | INTRAVENOUS | Status: AC | PRN
  Administered 2019-05-19: 10 mL
  Filled 2019-05-19: qty 10

## 2019-05-19 MED ORDER — DIPHENHYDRAMINE HCL 25 MG PO CAPS
25.0000 mg | ORAL_CAPSULE | Freq: Once | ORAL | Status: AC
  Administered 2019-05-19: 25 mg via ORAL

## 2019-05-19 MED ORDER — ACETAMINOPHEN 325 MG PO TABS
ORAL_TABLET | ORAL | Status: AC
  Filled 2019-05-19: qty 2

## 2019-05-19 NOTE — Assessment & Plan Note (Signed)
The cause of the anemia is multifactorial, likely due to chemotherapy, possible GI bleed and chronic renal disease We will proceed with 2 units of blood today and delay her chemo until the end of the week We discussed some of the risks, benefits, and alternatives of blood transfusions. The patient is symptomatic from anemia and the hemoglobin level is critically low.  Some of the side-effects to be expected including risks of transfusion reactions, chills, infection, syndrome of volume overload and risk of hospitalization from various reasons and the patient is willing to proceed and went ahead to sign consent today.

## 2019-05-19 NOTE — Progress Notes (Signed)
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[facility]) She continues to tolerate treatment very poorly with complications such as anemia, pain, neuropathy and recurrent infection/hospitalization Despite progression on multiple lines of treatment, she wants to continue chemotherapy Her recent tumor marker is mildly improved Due to severe anemia, we will delay her chemotherapy until the end of the week Repeat chest x-ray showed no evidence of worsening pleural effusion I recommend CT imaging next week for objective assessment of response to therapy  Anemia, chronic disease The cause of the anemia is multifactorial, likely due to chemotherapy, possible GI bleed and chronic renal disease We will proceed with 2 units of blood today and delay her chemo until the end of the week We discussed some of the risks, benefits, and alternatives of blood transfusions. The patient is symptomatic from anemia and the hemoglobin level is critically low.  Some of the side-effects to be expected including risks of transfusion reactions, chills, infection, syndrome of volume overload and risk of hospitalization from various reasons and the patient is willing to proceed and went ahead to sign consent today.   Pleural effusion She had recent malignant pleural effusion Her pleural drain was removed recently Repeat chest x-ray show no significant reaccumulation of pleural fluid  CKD (chronic kidney disease) stage 3, GFR 30-59 ml/min (HCC) She has stable chronic kidney disease stage III Observe closely for now   Orders Placed This  Encounter  Procedures  . DG Chest 2 View    Standing Status:   Future    Number of Occurrences:   1    Standing Expiration Date:   06/22/2020    Order Specific Question:   Reason for exam:    Answer:   pleural effsuion    Order Specific Question:   Preferred imaging location?    Answer:   [facility]  . CT ABDOMEN PELVIS W CONTRAST    Standing Status:   Future    Standing Expiration Date:   05/18/2020    Order Specific Question:   If indicated for the ordered procedure, I authorize the administration of contrast media per Radiology protocol    Answer:   Yes    Order Specific Question:   Preferred imaging location?    Answer:   [facility]    Order Specific Question:   Radiology Contrast Protocol - do NOT remove file path    Answer:   \\[email or web address]  . CT CHEST W CONTRAST    Standing Status:   Future    Standing Expiration Date:   05/18/2020    Order Specific Question:   If indicated for the ordered procedure, I authorize the administration of contrast media per Radiology protocol    Answer:   Yes    Order Specific Question:   Preferred imaging location?    Answer:   [facility]    Order Specific Question:   Radiology Contrast Protocol - do NOT remove file path    Answer:   \\[email or web address].pdf  . [place] [facility] only)  . Prepare RBC    Standing Status:   Standing    Number of Occurrences:   1  Order Specific Question:   # of Units    Answer:   2 units    Order Specific Question:   Transfusion Indications    Answer:   Symptomatic Anemia    Order Specific Question:   Special Requirements    Answer:   Irradiated    Order Specific Question:   If emergent release call blood bank    Answer:   Not emergent release    INTERVAL HISTORY: Please see below for problem oriented charting. She returns for further follow-up She continues to have chronic shortness of breath but denies recent cough No  recent fever or chills The patient denies any recent signs or symptoms of bleeding such as spontaneous epistaxis, hematuria or hematochezia. She complained of fatigue Her chronic pain is stable She denies worsening peripheral neuropathy from treatment  SUMMARY OF ONCOLOGIC HISTORY: Oncology History Overview Note  High grade serous ER 90%, PR 0% BRCA 1: no loss of expression MMR normal  R1 resection, platinum refractory, progressed on Femara, carboplatin and Gemzar   Left ovarian epithelial cancer ([place])  02/18/2016 Tumor Marker   Patient's tumor was tested for the following markers: CA125 Results of the tumor marker test revealed 45   05/22/2016 Tumor Marker   Patient's tumor was tested for the following markers: CA125 Results of the tumor marker test revealed 53   05/22/2016 Imaging   Outside pelvic US showed 4.1 cm adnexa mass   06/24/2017 Imaging   Ct abdomen and pelvis:  1. Interim finding of moderate ascites within the abdomen and pelvis with additional finding of diffuse nodular infiltration of the omentum and anterior mesenteric fat, the appearance would be consistent with peritoneal carcinomatosis/metastatic disease. Increasing retroperitoneal and upper abdominal adenopathy. 2. Re- demonstrated 3.8 cm cyst in the right adnexa. Enlarging soft tissue density in the left adnexa now with possible cystic component posteriorly. In light of the above findings, concern is for ovarian neoplasm. Correlation with pelvic ultrasound recommended. 3. Small right-sided pleural effusion, new since prior study 4. Stable hypodense splenic lesions since 2017.    06/25/2017 Imaging   US pelvis: 2.9 cm simple appearing cyst in the right ovary. Left ovary grossly unremarkable. Large volume ascites in the pelvis   06/30/2017 - 07/01/2017 Hospital Admission   She was admitted for evaluation of abdominal pain and ascites   07/01/2017 Pathology Results   PERITONEAL/ASCITIC FLUID(SPECIMEN 1 OF 1  COLLECTED 07/01/17): - POORLY DIFFERENTIATED CARCINOMA; SEE COMMENT Source Peritoneal/Ascitic Fluid, (specimen 1 of 1 collected 07/01/17) Gross Specimen: Received is/are 1000 cc's of brownish fluid. (BS:bs) Prepared: # Smears: 0 # Concentration Technique Slides (i.e. ThinPrep): 1 # Cell Block: 1 Additional Studies: Also received Hematology slide - [id number]. Comment The tumor cells are positive for cytokeratin 7 and Pax-8 but negative for cytokeratin 20, CDX-2, GATA-3, Napsin-A and TTF-1. Based on the immunoprofile a gynecology primary is favored   07/01/2017 Procedure   Successful ultrasound-guided diagnostic and therapeutic paracentesis yielding 2.5 liters of peritoneal fluid   07/07/2017 - 07/09/2017 Hospital Admission   She was admitted for management of malignant ascites   07/08/2017 Procedure   Successful ultrasound-guided therapeutic paracentesis yielding 2.7 liters liters of peritoneal fluid   07/12/2017 Procedure   Successful ultrasound-guided paracentesis yielding 1450 mL of peritoneal fluid   07/18/2017 - 07/24/2017 Hospital Admission   She was admitted for expedited treatment   07/18/2017 Tumor Marker   Patient's tumor was tested for the following markers: CA125 Results of the tumor marker test revealed 1941  07/19/2017 - 02/04/2018 Chemotherapy   The patient had 6 cycles of carboplatin & Taxol for chemotherapy treatment, followed by 3 more cycles of carboplatin only    07/19/2017 - 02/04/2018 Chemotherapy   The patient had carboplatin and taxol   08/06/2017 Procedure   Successful ultrasound-guided therapeutic paracentesis yielding 2.6 liters of peritoneal fluid.   08/09/2017 Tumor Marker   Patient's tumor was tested for the following markers: CA125 Results of the tumor marker test revealed 1665   08/15/2017 Tumor Marker   Patient's tumor was tested for the following markers: CA125 Results of the tumor marker test revealed 937.9   08/20/2017 Imaging   ECHO: Normal  LV size with EF 60-65%. Normal RV size and systolic function. No significant valvular abnormalities.   09/18/2017 Imaging   Chest Impression:  1. No evidence thoracic metastasis. 2. Interval increase and RIGHT pleural effusion.  Abdomen / Pelvis Impression:  1. Interval decrease in intraperitoneal free fluid. 2. Interval decrease in omental nodularity in the LEFT ventral peritoneal space. 3. Interval decrease in nodularity associated with the LEFT ovary. 4. Cystic portion of the RIGHT ovary is increased mildly in size.   09/20/2017 Tumor Marker   Patient's tumor was tested for the following markers: CA125 Results of the tumor marker test revealed 347   10/14/2017 Tumor Marker   Patient's tumor was tested for the following markers: CA125 Results of the tumor marker test revealed 307.4   11/04/2017 Tumor Marker   Patient's tumor was tested for the following markers: CA125 Results of the tumor marker test revealed 262.5   11/28/2017 Imaging   1. Interval decrease in right pleural effusion with resolution of right atelectasis seen previously. 2. New small left pleural effusion, symmetric to the right. 3. No intraperitoneal free fluid on the current study. 4. Continued further decrease in left omental disease, appearing less confluent today than on the prior study. 5. Left ovary remains normal in appearance today and the right adnexal cystic lesion is decreased in size compared to prior study. 6. 14 mm pancreatic cyst is unchanged. Continued attention on follow-up imaging recommended. 7. Aortic Atherosclerois (ICD10-170.0)   12/13/2017 Tumor Marker   Patient's tumor was tested for the following markers: CA125 Results of the tumor marker test revealed 197.7   01/03/2018 Tumor Marker   Patient's tumor was tested for the following markers: CA125 Results of the tumor marker test revealed 183.1   01/14/2018 Tumor Marker   Patient's tumor was tested for the following markers: CA125 Results  of the tumor marker test revealed 177.4   02/04/2018 Tumor Marker   Patient's tumor was tested for the following markers: CA125 Results of the tumor marker test revealed 168.5   02/25/2018 Imaging   1. Omental carcinomatosis appears qualitatively stable to slightly decreased. Stable mild peritoneal thickening in the paracolic gutters. Stable right adnexal cyst. No ascites. No new or progressive metastatic disease in the abdomen or pelvis. 2. Small dependent right pleural effusion is increased. 3. Cystic pancreatic body lesion is decreased and now subcentimeter in size, suggesting a benign lesion. 4. Aortic Atherosclerosis (ICD10-I70.0).   03/03/2018 - 03/07/2018 Hospital Admission   She was hospitalized for GI bleed requiring blood transfusions. Xarelto was placed on hold   03/07/2018 PET scan   1. Persistent hazy omental interstitial nodularity but no hypermetabolism or discrete measurable nodules. No abdominal ascites. 2. No findings for metastatic disease involving the chest. 3. Moderate-sized right pleural effusion and small left pleural effusion.    03/20/2018 Pathology Results  1. Ovary and fallopian tube, right - OVARY AND FALLOPIAN TUBE INVOLVED BY SEROUS CARCINOMA. - PARATUBAL CYST. 2. Uterus +/- tubes/ovaries, neoplastic, cervix, left ovary and fallopian tube - LEFT OVARY: HIGH GRADE SEROUS CARCINOMA WITH TREATMENT EFFECT, SPANNING 2.5 CM. CARCINOMA INVOLVES OVARIAN SURFACE. SEE ONCOLOGY TABLE. - LEFT FALLOPIAN TUBE: INVOLVED BY SEROUS CARCINOMA. - UTERUS: -ENDOMETRIUM: INACTIVE ENDOMETRIUM. NO HYPERPLASIA OR MALIGNANCY. -MYOMETRIUM: UNREMARKABLE. NO MALIGNANCY. -SEROSA: INVOLVED BY SEROUS CARCINOMA. - CERVIX: ENDOCERVICAL POLYP. NO MALIGNANCY. 3. Omentum, resection for tumor - INVOLVED BY SEROUS CARCINOMA. 4. Soft tissue, biopsy, mesenteric nodule - INVOLVED BY SEROUS CARCINOMA. Microscopic Comment 2. OVARY or FALLOPIAN TUBE or PRIMARY PERITONEUM: Procedure: Total  hysterectomy and bilateral salpingo-oophorectomy. Omentectomy. Mesenteric lymph node biopsy. Specimen Integrity: Intact. Tumor Site: Left ovary. Ovarian Surface Involvement (required only if applicable): Present. Fallopian Tube Surface Involvement (required only if applicable): Present, bilateral. Tumor Size: 2.5 cm. Histologic Type: High grade serous carcinoma. Histologic Grade: High grade. Implants (required for advanced stage serous/seromucinous borderline tumors only): N/A. Other Tissue/ Organ Involvement: Bilateral fallopian tubes, right ovary, uterine serosa, omentum. Largest Extrapelvic Peritoneal Focus (required only if applicable): Microscopic, estimated 0.5 cm (omentum). Peritoneal/Ascitic Fluid: Prior Positive ([id number]). Treatment Effect (required only for high-grade serous carcinomas): Present in left ovary. CRS2. Regional Lymph Nodes: No lymph nodes submitted/identified. Pathologic Stage Classification (pTNM, AJCC 8th Edition): ypT3b, ypNX Representative Tumor Block: 1A, 1B, 648F, 48F. Comment(s): The right ovary has only surface deposits with a large paratubal cyst. The left ovary has intraparenchymal tumor with associated treatment effect. Thus the tumor location is classified as a left ovarian primary.   03/20/2018 Surgery   Procedure(s) Performed:  1. Exploratory laparotomy with total hysterectomy and bilateral salpingo-oophorectomy 2. Infragastic Omentectomy  3. Debulking to <1cm gross residual disease   Surgeon: Mart Piggs, MD  Specimens: Uterus Cervix, Bilateral tubes / ovaries and omentum. Mesenteric nodule.  Operative Findings: Debulked to gross residual disease <1cm; however there is miliary disease in multiple locations including the majority of the abdominal peritoneum (anterior abdominal wall, bilateral gutters), diaphragm (Right>left), majority of small bowel mesentary. Normal appendix. Normal small uterus. Right ovary with a cystic lesion ~3cm,  some adhesive disease of right adnexa to rectum/sigmoid. Gross omental disease, which was resected with the omentectomy. Smooth liver surface, but again, diaphragmatic disease noted.      03/20/2018 Genetic Testing   Patient has genetic testing done for ER/PR. Results revealed patient has ER: 90%, PR 0%.    03/31/2018 Tumor Marker   Patient's tumor was tested for the following markers: CA125 Results of the tumor marker test revealed 215.8    Genetic Testing   Patient has genetic testing done for BRCA 1. Results revealed patient has the following: BRCA 1: no loss of expression.    Genetic Testing   Patient has genetic testing done for MMR . Results revealed patient has the following:  MMR: normal   03/31/2018 Genetic Testing   Patient has genetic testing done for BRCA1/2. Results revealed patient has no actionable mutations. She is found to have [place] genetic change of unknown significance   04/21/2018 Imaging   1. No definite findings of residual or recurrent metastatic disease in the abdomen or pelvis status post interval TAHBSO and omentectomy. Stable minimal thickening in the paracolic gutters without discrete peritoneal nodularity. 2. Trace free fluid in the pelvic cul-de-sac. 3. Stable small dependent right pleural effusion. 4. Subcentimeter pancreatic body cystic lesion is stable to slightly decreased. 5. Aortic Atherosclerosis (ICD10-I70.0).   04/21/2018  Tumor Marker   Patient's tumor was tested for the following markers: CA125 Results of the tumor marker test revealed 187.3   04/23/2018 - 09/12/2018 Anti-estrogen oral therapy   She is placed on Femara   05/08/2018 Procedure   Successful ultrasound guided right thoracentesis yielding 800 mL of pleural fluid. Fluid cytology is negative for malignancy    05/28/2018 Tumor Marker   Patient's tumor was tested for the following markers: CA125 Results of the tumor marker test revealed 190   06/30/2018 Tumor Marker    Patient's tumor was tested for the following markers: CA125 Results of the tumor marker test revealed 148   07/23/2018 Imaging   Status post hysterectomy and bilateral salpingo-oophorectomy.  Very mild peritoneal thickening/nodularity, equivocal but worrisome for very mild peritoneal disease. Attention on follow-up is suggested.  Small right pleural effusion with indwelling pleural drain.   07/23/2018 Tumor Marker   Patient's tumor was tested for the following markers: CA125 Results of the tumor marker test revealed 215   09/11/2018 Tumor Marker   Patient's tumor was tested for the following markers: CA-125. Results of the tumor marker test revealed 1323   09/12/2018 Imaging   1. Mildly progressive ascites with some mild degree of nodularity most appreciable along the left adnexa, right paracolic gutter, right upper quadrant, compatible with peritoneal spread of tumor. 2. Small right pleural effusion with indwelling right pleural catheter again noted. Mild increase in atelectasis anteriorly at the right lung base. 3. Aortic Atherosclerosis (ICD10-I70.0). Coronary atherosclerosis. 4. Several tiny hypodense lesions in the spleen are technically nonspecific, but stable. 5. Air fluid level in the rectum compatible with diarrheal process. 6. Prominent bilateral hip arthropathy. Left hip screw noted.   09/22/2018 - 02/02/2019 Chemotherapy   The patient had carboplatin and gemzar   09/29/2018 Tumor Marker   Patient's tumor was tested for the following markers: CA125 Results of the tumor marker test revealed 1219   09/29/2018 Adverse Reaction   Cycle 1 day 8 of Gemzar was omitted due to severe anemia   10/20/2018 Tumor Marker   Patient's tumor was tested for the following markers: CA125 Results of the tumor marker test revealed 402   11/13/2018 Tumor Marker   Patient's tumor was tested for the following markers: CA125 Results of the tumor marker test revealed 437   11/26/2018 Imaging    1. Mild residual peritoneal thickening, overall decreased from 09/11/2018, with resolution of previously seen ascites. 2. Tiny residual right pleural effusion with small bore chest tube in place. 3. Supraumbilical midline ventral hernia has a wide neck and contains unobstructed colon. 4.  Aortic atherosclerosis (ICD10-170.0).    01/05/2019 Tumor Marker   Patient's tumor was tested for the following markers: CA125 Results of the tumor marker test revealed 929   02/02/2019 Tumor Marker   Patient's tumor was tested for the following markers: CA125 Results of the tumor marker test revealed 1198   02/09/2019 Imaging   1. New mild ascites. Mild peritoneal thickening shows no significant change, but remains consistent with peritoneal carcinomatosis. 2. Increased tiny bilateral pleural effusions. 3. Partial small bowel obstruction with transition point in the anterior lower abdomen, just deep to the anterior abdominal wall. 4. Stable small ventral abdominal wall hernia containing transverse colon.   02/17/2019 -  Chemotherapy   The patient had PACLitaxel for chemotherapy treatment.    03/05/2019 Tumor Marker   Patient's tumor was tested for the following markers: CA125 Results of the tumor marker test revealed 1243   03/11/2019  Imaging   CXR Small bilateral subpulmonic effusions with basilar atelectasis. These are probably slightly larger than were seen in March.   03/30/2019 Tumor Marker   Patient's tumor was tested for the following markers: CA-125 Results of the tumor marker test revealed 776   05/01/2019 Tumor Marker   Patient's tumor was tested for the following markers: CA-125 Results of the tumor marker test revealed 180     REVIEW OF SYSTEMS:   Constitutional: Denies fevers, chills or abnormal weight loss Eyes: Denies blurriness of vision Ears, nose, mouth, throat, and face: Denies mucositis or sore throat Cardiovascular: Denies palpitation, chest discomfort or lower extremity  swelling Gastrointestinal:  Denies nausea, heartburn or change in bowel habits Skin: Denies abnormal skin rashes Lymphatics: Denies new lymphadenopathy or easy bruising Neurological:Denies numbness, tingling or new weaknesses Behavioral/Psych: Mood is stable, no new changes  All other systems were reviewed with the patient and are negative.  I have reviewed the past medical history, past surgical history, social history and family history with the patient and they are unchanged from previous note.  ALLERGIES:  is allergic to codeine.  MEDICATIONS:  Current Outpatient Medications  Medication Sig Dispense Refill  . albuterol (PROAIR HFA) 108 (90 Base) MCG/ACT inhaler Inhale 2 puffs into the lungs every 6 (six) hours as needed for wheezing or shortness of breath. 18 g 5  . ALPRAZolam (XANAX) 0.25 MG tablet Take 1 tablet (0.25 mg total) by mouth at bedtime as needed for anxiety. 5 tablet 0  . azelastine (ASTELIN) 0.1 % nasal spray Place 2 sprays into both nostrils 2 (two) times daily as needed for allergies.     . Cholecalciferol (VITAMIN D) 2000 units CAPS Take 2,000 Units by mouth daily.     Marland Kitchen dicyclomine (BENTYL) 10 MG capsule TAKE 1 CAPSULE(10 MG) BY MOUTH TWICE DAILY 60 capsule 1  . diltiazem (CARDIZEM CD) 180 MG 24 hr capsule Take 1 capsule (180 mg total) by mouth 2 (two) times a day. 180 capsule 3  . diphenhydrAMINE (BENADRYL) 25 mg capsule Take 25 mg by mouth every 8 (eight) hours as needed for itching.     . DULoxetine (CYMBALTA) 60 MG capsule Take 1 capsule (60 mg total) by mouth at bedtime. 30 capsule 3  . fluticasone (FLONASE) 50 MCG/ACT nasal spray Place 2 sprays into both nostrils 2 (two) times daily as needed (FOR NASAL CONGESTION.).     Marland Kitchen furosemide (LASIX) 40 MG tablet Take 1 tablet (40 mg total) by mouth daily. 90 tablet 3  . halobetasol (ULTRAVATE) 0.05 % cream Apply 1 application topically 2 (two) times daily as needed (psoriasis).     Marland Kitchen levothyroxine (SYNTHROID, LEVOTHROID)  137 MCG tablet Take 137 mcg by mouth daily before breakfast. For thyroid therapy    . Magnesium 400 MG CAPS Take 400 mg by mouth 2 (two) times a day.     . metoprolol tartrate (LOPRESSOR) 25 MG tablet Take 1 tablet (25 mg total) by mouth 2 (two) times daily as needed. 180 tablet 3  . mirtazapine (REMERON) 15 MG tablet Take 1 tablet (15 mg total) by mouth at bedtime. 30 tablet 9  . oxyCODONE (ROXICODONE) 15 MG immediate release tablet Take 1 tablet (15 mg total) by mouth every 4 (four) hours as needed for pain. 60 tablet 0  . polyvinyl alcohol (LIQUIFILM TEARS) 1.4 % ophthalmic solution Place 1 drop into both eyes 2 (two) times daily as needed for dry eyes.     . potassium chloride SA (  K-DUR,KLOR-CON) 20 MEQ tablet TAKE 1 TABLET BY MOUTH DAILY AND TAKE 1 ADDITIONAL TABLET WHEN TAKING EXTRA LASIX DOSE (Patient taking differently: Take 20 mEq by mouth daily. ) 60 tablet 6  . STIOLTO RESPIMAT 2.5-2.5 MCG/ACT AERS INHALE 2 PUFFS INTO THE LUNGS DAILY (Patient taking differently: Inhale 2 puffs into the lungs daily. ) 1 g 3  . XARELTO 20 MG TABS tablet TAKE 1 TABLET BY MOUTH EVERY DAY AFTER SUPPER (Patient taking differently: Take 20 mg by mouth daily with supper. ) 90 tablet 3   No current facility-administered medications for this visit.    Facility-Administered Medications Ordered in Other Visits  Medication Dose Route Frequency Provider Last Rate Last Dose  . 0.9 %  sodium chloride infusion (Manually program via Guardrails IV Fluids)  250 mL Intravenous Once Heath Lark, MD   Stopped at 12/16/18 1500    PHYSICAL EXAMINATION: ECOG PERFORMANCE STATUS: 2 - Symptomatic, <50% confined to bed  Vitals:   05/19/19 1107  BP: (!) 123/48  Pulse: 73  Resp: 18  Temp: 98.2 F (36.8 C)  SpO2: 97%   Filed Weights   05/19/19 1107  Weight: 174 lb 1.6 oz (79 kg)    GENERAL:alert, no distress and comfortable.  Limited exam due to sitting on a wheelchair She has oxygen in situ SKIN: skin color, texture,  turgor are normal, no rashes or significant lesions EYES: normal, Conjunctiva are pale and non-injected, sclera clear OROPHARYNX:no exudate, no erythema and lips, buccal mucosa, and tongue normal  NECK: supple, thyroid normal size, non-tender, without nodularity LYMPH:  no palpable lymphadenopathy in the cervical, axillary or inguinal LUNGS: She has bilateral crackles in lung bases HEART: regular rate & rhythm and no murmurs and no lower extremity edema ABDOMEN:abdomen soft, non-tender and normal bowel sounds Musculoskeletal:no cyanosis of digits and no clubbing  NEURO: alert & oriented x 3 with fluent speech, no focal motor/sensory deficits  LABORATORY DATA:  I have reviewed the data as listed    Component Value Date/Time   NA 136 05/19/2019 1040   NA 135 (L) 09/24/2017 0945   K 3.5 05/19/2019 1040   K 3.7 09/24/2017 0945   CL 98 05/19/2019 1040   CO2 31 05/19/2019 1040   CO2 30 (H) 09/24/2017 0945   GLUCOSE 133 (H) 05/19/2019 1040   GLUCOSE 101 09/24/2017 0945   BUN 21 05/19/2019 1040   BUN 15.3 09/24/2017 0945   CREATININE 1.11 (H) 05/19/2019 1040   CREATININE 0.8 09/24/2017 0945   CALCIUM 8.7 (L) 05/19/2019 1040   CALCIUM 9.1 09/24/2017 0945   PROT 6.7 05/19/2019 1040   PROT 6.6 09/24/2017 0945   ALBUMIN 3.2 (L) 05/19/2019 1040   ALBUMIN 3.3 (L) 09/24/2017 0945   AST 16 05/19/2019 1040   AST 16 09/24/2017 0945   ALT 11 05/19/2019 1040   ALT 12 09/24/2017 0945   ALKPHOS 56 05/19/2019 1040   ALKPHOS 52 09/24/2017 0945   BILITOT 0.4 05/19/2019 1040   BILITOT 1.13 09/24/2017 0945   GFRNONAA 46 (L) 05/19/2019 1040   GFRAA 53 (L) 05/19/2019 1040    No results found for: SPEP, UPEP  Lab Results  Component Value Date   WBC 6.7 05/19/2019   NEUTROABS 4.1 05/19/2019   HGB 6.9 (LL) 05/19/2019   HCT 23.4 (L) 05/19/2019   MCV 80.1 05/19/2019   PLT 248 05/19/2019      Chemistry      Component Value Date/Time   NA 136 05/19/2019 1040   NA 135 (  L) 09/24/2017 0945    K 3.5 05/19/2019 1040   K 3.7 09/24/2017 0945   CL 98 05/19/2019 1040   CO2 31 05/19/2019 1040   CO2 30 (H) 09/24/2017 0945   BUN 21 05/19/2019 1040   BUN 15.3 09/24/2017 0945   CREATININE 1.11 (H) 05/19/2019 1040   CREATININE 0.8 09/24/2017 0945      Component Value Date/Time   CALCIUM 8.7 (L) 05/19/2019 1040   CALCIUM 9.1 09/24/2017 0945   ALKPHOS 56 05/19/2019 1040   ALKPHOS 52 09/24/2017 0945   AST 16 05/19/2019 1040   AST 16 09/24/2017 0945   ALT 11 05/19/2019 1040   ALT 12 09/24/2017 0945   BILITOT 0.4 05/19/2019 1040   BILITOT 1.13 09/24/2017 0945       RADIOGRAPHIC STUDIES: I have personally reviewed the radiological images as listed and agreed with the findings in the report. Dg Chest 2 View  Result Date: 05/19/2019 CLINICAL DATA:  History of ovarian cancer. Shortness of breath. History of atrial fibrillation. EXAM: CHEST - 2 VIEW COMPARISON:  Chest x-rays dated 04/26/2019, 03/30/2019 in 12/17/2017. FINDINGS: Heart size and mediastinal contours are stable. RIGHT chest wall Port-A-Cath is stable in position with tip at the level of the lower SVC. Lungs are hyperexpanded suggesting COPD. Chronic bibasilar opacities, likely atelectasis and/or small pleural effusions. No new lung findings. No pneumothorax seen. No acute or suspicious osseous finding. IMPRESSION: 1. No active cardiopulmonary disease. No evidence of pneumonia or pulmonary edema. 2. Hyperexpanded lungs suggesting COPD. 3. Chronic bibasilar opacities, likely mild atelectasis and/or small pleural effusions. Electronically Signed   By: Franki Cabot M.D.   On: 05/19/2019 12:07   Dg Chest 2 View  Result Date: 04/26/2019 CLINICAL DATA:  Chest discomfort, hx of a-fib, low O2 sat at home- pt undergoing chemo for ovarian cancer EXAM: CHEST - 2 VIEW COMPARISON:  04/17/2019 FINDINGS: Power injectable right IJ Port-A-Cath tip: SVC. Atherosclerotic calcification of the aortic arch. Heart size within normal limits. Blunting of  both costophrenic angles compatible with pleural effusions. Subtle subpleural lucency along the right lung apex; although this may be from a skin wrinkle, I cannot completely exclude a small apical pneumothorax. New nodularity in the left lung apex including a 1.3 cm pulmonary nodule projecting of the left first rib and perhaps another adjacent nodule. Thoracic spondylosis. Linear subsegmental atelectasis at the left lung base. IMPRESSION: 1. I cannot exclude a tiny right apical pneumothorax. Also, there are potential new pulmonary nodules at the left lung apex. Chest CT is recommended for further characterization. 2. Small bilateral pleural effusions. 3.  Aortic Atherosclerosis (ICD10-I70.0). Electronically Signed   By: Van Clines M.D.   On: 04/26/2019 15:22   Ct Chest Wo Contrast  Result Date: 04/26/2019 CLINICAL DATA:  85 year old presenting with current history of ovarian cancer for which she is undergoing chemotherapy, current history of atrial fibrillation, presenting with chest discomfort. Abnormal chest x-ray earlier today questioning a RIGHT apically pneumothorax and LEFT apically lung nodules. EXAM: CT CHEST WITHOUT CONTRAST TECHNIQUE: Multidetector CT imaging of the chest was performed following the standard protocol without IV contrast. COMPARISON:  No prior chest CT. FINDINGS: Cardiovascular: Heart size upper normal to slightly enlarged. Severe three-vessel coronary atherosclerosis. RIGHT jugular Port-A-Cath tip at the cavoatrial junction. No pericardial effusion. Moderate to severe atherosclerosis involving the thoracic and proximal abdominal aorta without evidence of aneurysm. Mediastinum/Nodes: Numerous normal sized lymph nodes throughout the mediastinum. No pathologic lymphadenopathy. Fluid in the mid portion of the normal appearing  esophagus without evidence of hiatal hernia. Mild thyromegaly without nodularity. Lungs/Pleura: No evidence of a RIGHT pneumothorax or LEFT apical nodules as  questioned on the earlier chest x-ray. Emphysematous changes in both lungs. No parenchymal nodules or masses to suggest metastatic disease. Moderately large RIGHT pleural effusion and associated consolidation in the RIGHT LOWER LOBE. Small LEFT pleural effusion and associated mild consolidation in the LEFT LOWER LOBE. Moderate BILATERAL lower lobe and mild BILATERAL upper lobe bronchiectasis. Central airways patent with moderate to marked bronchial wall thickening. Upper Abdomen: Minimal perihepatic ascites. Visualized upper abdomen otherwise unremarkable for the unenhanced technique. Musculoskeletal: Osseous demineralization. Benign bone island in the T11 vertebral body. Degenerative changes and DISH involving the mid and LOWER thoracic spine. No acute findings. IMPRESSION: 1. No evidence of a RIGHT pneumothorax or LEFT apical nodules as questioned on the earlier chest x-ray. 2. Moderately large RIGHT pleural effusion and small LEFT pleural effusion with associated passive atelectasis (favored over pneumonia) in the lower lobes. 3. Moderate to marked central bronchial wall thickening indicating asthma and/or bronchitis. 4. Moderate bilateral lower lobe and mild bilateral upper lobe bronchiectasis. 5. Minimal perihepatic ascites. Aortic Atherosclerosis (ICD10-I70.0) and Emphysema (ICD10-J43.9). Electronically Signed   By: Evangeline Dakin M.D.   On: 04/26/2019 16:19    All questions were answered. The patient knows to call the clinic with any problems, questions or concerns. No barriers to learning was detected.  I spent 30 minutes counseling the patient face to face. The total time spent in the appointment was 40 minutes and more than 50% was on counseling and review of test results  Heath Lark, MD 05/19/2019 5:41 PM

## 2019-05-19 NOTE — Telephone Encounter (Signed)
Received call from lab stating pt's HGB 6.9.  Pt seeing Dr Alvy Bimler today & is scheduled in infusion today.  Informed Sherlyn Lick of this result.

## 2019-05-19 NOTE — Assessment & Plan Note (Signed)
She continues to tolerate treatment very poorly with complications such as anemia, pain, neuropathy and recurrent infection/hospitalization Despite progression on multiple lines of treatment, she wants to continue chemotherapy Her recent tumor marker is mildly improved Due to severe anemia, we will delay her chemotherapy until the end of the week Repeat chest x-ray showed no evidence of worsening pleural effusion I recommend CT imaging next week for objective assessment of response to therapy

## 2019-05-19 NOTE — Telephone Encounter (Signed)
Scheduled appt per 8/11 sch message - chemo on 8/14 - daughter aware of appt

## 2019-05-19 NOTE — Patient Instructions (Signed)
Blood Transfusion, Adult, Care After This sheet gives you information about how to care for yourself after your procedure. Your doctor may also give you more specific instructions. If you have problems or questions, contact your doctor. Follow these instructions at home:   Take over-the-counter and prescription medicines only as told by your doctor.  Go back to your normal activities as told by your doctor.  Follow instructions from your doctor about how to take care of the area where an IV tube was put into your vein (insertion site). Make sure you: ? Wash your hands with soap and water before you change your bandage (dressing). If there is no soap and water, use hand sanitizer. ? Change your bandage as told by your doctor.  Check your IV insertion site every day for signs of infection. Check for: ? More redness, swelling, or pain. ? More fluid or blood. ? Warmth. ? Pus or a bad smell. Contact a doctor if:  You have more redness, swelling, or pain around the IV insertion site.  You have more fluid or blood coming from the IV insertion site.  Your IV insertion site feels warm to the touch.  You have pus or a bad smell coming from the IV insertion site.  Your pee (urine) turns pink, red, or brown.  You feel weak after doing your normal activities. Get help right away if:  You have signs of a serious allergic or body defense (immune) system reaction, including: ? Itchiness. ? Hives. ? Trouble breathing. ? Anxiety. ? Pain in your chest or lower back. ? Fever, flushing, and chills. ? Fast pulse. ? Rash. ? Watery poop (diarrhea). ? Throwing up (vomiting). ? Dark pee. ? Serious headache. ? Dizziness. ? Stiff neck. ? Yellow color in your face or the white parts of your eyes (jaundice). Summary  After a blood transfusion, return to your normal activities as told by your doctor.  Every day, check for signs of infection where the IV tube was put into your vein.  Some  signs of infection are warm skin, more redness and pain, more fluid or blood, and pus or a bad smell where the needle went in.  Contact your doctor if you feel weak or have any unusual symptoms. This information is not intended to replace advice given to you by your health care provider. Make sure you discuss any questions you have with your health care provider. Document Released: 10/15/2014 Document Revised: 01/29/2018 Document Reviewed: 05/18/2016 Elsevier Patient Education  2020 [employer]  

## 2019-05-19 NOTE — Assessment & Plan Note (Signed)
She has stable chronic kidney disease stage III Observe closely for now

## 2019-05-19 NOTE — Assessment & Plan Note (Signed)
She had recent malignant pleural effusion Her pleural drain was removed recently Repeat chest x-ray show no significant reaccumulation of pleural fluid

## 2019-05-20 ENCOUNTER — Other Ambulatory Visit: Payer: Self-pay | Admitting: Cardiology

## 2019-05-20 LAB — TYPE AND SCREEN
ABO/RH(D): O POS
Antibody Screen: NEGATIVE
Unit division: 0
Unit division: 0

## 2019-05-20 LAB — BPAM RBC
Blood Product Expiration Date: 202009042359
Blood Product Expiration Date: 202009042359
ISSUE DATE / TIME: 202008111247
ISSUE DATE / TIME: 202008111247
Unit Type and Rh: 5100
Unit Type and Rh: 5100

## 2019-05-20 LAB — CA 125: Cancer Antigen (CA) 125: 163 U/mL — ABNORMAL HIGH (ref 0.0–38.1)

## 2019-05-22 ENCOUNTER — Inpatient Hospital Stay: Payer: Medicare Other

## 2019-05-22 ENCOUNTER — Other Ambulatory Visit: Payer: Self-pay

## 2019-05-22 VITALS — BP 141/63 | HR 70 | Temp 98.9°F | Resp 18

## 2019-05-22 DIAGNOSIS — C562 Malignant neoplasm of left ovary: Secondary | ICD-10-CM

## 2019-05-22 DIAGNOSIS — Z5111 Encounter for antineoplastic chemotherapy: Secondary | ICD-10-CM | POA: Diagnosis not present

## 2019-05-22 MED ORDER — DIPHENHYDRAMINE HCL 50 MG/ML IJ SOLN
INTRAMUSCULAR | Status: AC
  Filled 2019-05-22: qty 1

## 2019-05-22 MED ORDER — FAMOTIDINE IN NACL 20-0.9 MG/50ML-% IV SOLN
INTRAVENOUS | Status: AC
  Filled 2019-05-22: qty 50

## 2019-05-22 MED ORDER — DIPHENHYDRAMINE HCL 50 MG/ML IJ SOLN
50.0000 mg | Freq: Once | INTRAMUSCULAR | Status: AC
  Administered 2019-05-22: 50 mg via INTRAVENOUS

## 2019-05-22 MED ORDER — HEPARIN SOD (PORK) LOCK FLUSH 100 UNIT/ML IV SOLN
500.0000 [IU] | Freq: Once | INTRAVENOUS | Status: AC | PRN
  Administered 2019-05-22: 500 [IU]
  Filled 2019-05-22: qty 5

## 2019-05-22 MED ORDER — SODIUM CHLORIDE 0.9 % IV SOLN
64.0000 mg/m2 | Freq: Once | INTRAVENOUS | Status: AC
  Administered 2019-05-22: 126 mg via INTRAVENOUS
  Filled 2019-05-22: qty 21

## 2019-05-22 MED ORDER — SODIUM CHLORIDE 0.9 % IV SOLN
20.0000 mg | Freq: Once | INTRAVENOUS | Status: AC
  Administered 2019-05-22: 20 mg via INTRAVENOUS
  Filled 2019-05-22: qty 2

## 2019-05-22 MED ORDER — FAMOTIDINE IN NACL 20-0.9 MG/50ML-% IV SOLN
20.0000 mg | Freq: Once | INTRAVENOUS | Status: AC
  Administered 2019-05-22: 20 mg via INTRAVENOUS

## 2019-05-22 MED ORDER — SODIUM CHLORIDE 0.9% FLUSH
10.0000 mL | INTRAVENOUS | Status: DC | PRN
  Administered 2019-05-22: 10 mL
  Filled 2019-05-22: qty 10

## 2019-05-22 MED ORDER — SODIUM CHLORIDE 0.9 % IV SOLN
Freq: Once | INTRAVENOUS | Status: AC
  Administered 2019-05-22: 14:00:00 via INTRAVENOUS
  Filled 2019-05-22: qty 250

## 2019-05-22 NOTE — Progress Notes (Signed)
Okay to proceed with treatment today with hem from 8/11

## 2019-05-22 NOTE — Patient Instructions (Signed)
[facility] Discharge Instructions for Patients Receiving Chemotherapy  Today you received the following chemotherapy agents:  Taxol.  To help prevent nausea and vomiting after your treatment, we encourage you to take your nausea medication as directed.   If you develop nausea and vomiting that is not controlled by your nausea medication, call the clinic.   BELOW ARE SYMPTOMS THAT SHOULD BE REPORTED IMMEDIATELY:  *FEVER GREATER THAN 100.5 F  *CHILLS WITH OR WITHOUT FEVER  NAUSEA AND VOMITING THAT IS NOT CONTROLLED WITH YOUR NAUSEA MEDICATION  *UNUSUAL SHORTNESS OF BREATH  *UNUSUAL BRUISING OR BLEEDING  TENDERNESS IN MOUTH AND THROAT WITH OR WITHOUT PRESENCE OF ULCERS  *URINARY PROBLEMS  *BOWEL PROBLEMS  UNUSUAL RASH Items with * indicate a potential emergency and should be followed up as soon as possible.  Feel free to call the clinic should you have any questions or concerns. The clinic phone number is [phone].  Please show the CHEMO ALERT CARD at check-in to the Emergency Department and triage nurse.   

## 2019-05-29 ENCOUNTER — Encounter (HOSPITAL_COMMUNITY): Payer: Self-pay

## 2019-05-29 ENCOUNTER — Other Ambulatory Visit: Payer: Self-pay

## 2019-05-29 ENCOUNTER — Ambulatory Visit (HOSPITAL_COMMUNITY)
Admission: RE | Admit: 2019-05-29 | Discharge: 2019-05-29 | Disposition: A | Discharge status: Home / Self Care | Payer: Medicare Other | Source: Ambulatory Visit | Attending: Hematology and Oncology | Admitting: Hematology and Oncology

## 2019-05-29 DIAGNOSIS — J181 Lobar pneumonia, unspecified organism: Secondary | ICD-10-CM | POA: Insufficient documentation

## 2019-05-29 DIAGNOSIS — C562 Malignant neoplasm of left ovary: Secondary | ICD-10-CM

## 2019-05-29 DIAGNOSIS — J90 Pleural effusion, not elsewhere classified: Secondary | ICD-10-CM | POA: Diagnosis not present

## 2019-05-29 DIAGNOSIS — J189 Pneumonia, unspecified organism: Secondary | ICD-10-CM

## 2019-05-29 MED ORDER — HEPARIN SOD (PORK) LOCK FLUSH 100 UNIT/ML IV SOLN
INTRAVENOUS | Status: AC
  Administered 2019-05-29: 500 [IU] via INTRAVENOUS
  Filled 2019-05-29: qty 5

## 2019-05-29 MED ORDER — IOHEXOL 300 MG/ML  SOLN
100.0000 mL | Freq: Once | INTRAMUSCULAR | Status: AC | PRN
  Administered 2019-05-29: 100 mL via INTRAVENOUS

## 2019-05-29 MED ORDER — SODIUM CHLORIDE (PF) 0.9 % IJ SOLN
INTRAMUSCULAR | Status: AC
  Filled 2019-05-29: qty 50

## 2019-05-29 MED ORDER — HEPARIN SOD (PORK) LOCK FLUSH 100 UNIT/ML IV SOLN
500.0000 [IU] | Freq: Once | INTRAVENOUS | Status: AC
  Administered 2019-05-29: 14:00:00 500 [IU] via INTRAVENOUS

## 2019-06-02 ENCOUNTER — Telehealth: Payer: Self-pay | Admitting: Hematology and Oncology

## 2019-06-02 ENCOUNTER — Other Ambulatory Visit: Payer: Self-pay | Admitting: Hematology and Oncology

## 2019-06-02 ENCOUNTER — Inpatient Hospital Stay (HOSPITAL_BASED_OUTPATIENT_CLINIC_OR_DEPARTMENT_OTHER): Payer: Medicare Other | Admitting: Hematology and Oncology

## 2019-06-02 ENCOUNTER — Other Ambulatory Visit: Payer: Self-pay

## 2019-06-02 ENCOUNTER — Encounter: Payer: Self-pay | Admitting: Hematology and Oncology

## 2019-06-02 VITALS — BP 136/53 | HR 96 | Temp 98.7°F | Resp 18 | Ht 64.0 in | Wt 163.4 lb

## 2019-06-02 DIAGNOSIS — J90 Pleural effusion, not elsewhere classified: Secondary | ICD-10-CM

## 2019-06-02 DIAGNOSIS — I482 Chronic atrial fibrillation, unspecified: Secondary | ICD-10-CM | POA: Diagnosis not present

## 2019-06-02 DIAGNOSIS — C562 Malignant neoplasm of left ovary: Secondary | ICD-10-CM

## 2019-06-02 DIAGNOSIS — G620 Drug-induced polyneuropathy: Secondary | ICD-10-CM

## 2019-06-02 DIAGNOSIS — G893 Neoplasm related pain (acute) (chronic): Secondary | ICD-10-CM | POA: Diagnosis not present

## 2019-06-02 DIAGNOSIS — D638 Anemia in other chronic diseases classified elsewhere: Secondary | ICD-10-CM

## 2019-06-02 DIAGNOSIS — T451X5A Adverse effect of antineoplastic and immunosuppressive drugs, initial encounter: Secondary | ICD-10-CM

## 2019-06-02 DIAGNOSIS — Z5111 Encounter for antineoplastic chemotherapy: Secondary | ICD-10-CM | POA: Diagnosis not present

## 2019-06-02 MED ORDER — MORPHINE SULFATE 15 MG PO TABS: 15.0000 mg | ORAL_TABLET | ORAL | 0 refills | Status: DC | PRN

## 2019-06-02 NOTE — Assessment & Plan Note (Signed)
She has multifactorial anemia She will receive transfusion support If her hemoglobin is less than 8, she will receive 1 unit of irradiated blood

## 2019-06-02 NOTE — Progress Notes (Signed)
[place] OFFICE PROGRESS NOTE  Patient Care Team: Asencion Noble, MD as PCP - General (Internal Medicine) Satira Sark, MD as PCP - Cardiology (Cardiology) Ahmed Prima, Fransisco Hertz, PA-C as Physician Assistant (Physician Assistant) Heath Lark, MD as Consulting Physician (Hematology and Oncology) Collene Gobble, MD as Consulting Physician (Pulmonary Disease)  ASSESSMENT & PLAN:  Left ovarian epithelial cancer [facility]) I have reviewed CT imaging with the patient and family She has positive response to treatment The patient wants to continue treatment indefinitely I will schedule her to continue treatment every other week with dose adjustment and transfusion support   Anemia, chronic disease She has multifactorial anemia She will receive transfusion support If her hemoglobin is less than 8, she will receive 1 unit of irradiated blood  Chronic atrial fibrillation She is on Xarelto for chronic atrial fibrillation with excessive bruising She has appointment to meet with cardiologist to discuss treatment options I will defer to her cardiologist for further management  Peripheral neuropathy due to chemotherapy [facility]) She denies recent worsening neuropathy We will continue with reduced dose Taxol  Cancer associated pain She has poorly controlled pain She felt that oxycodone is causing itching We discussed treatment options She is willing to try IR morphine  Pleural effusion She has persistent pleural effusion noted on CT imaging The amount of fluid is much I do not recommend thoracentesis right now   No orders of the defined types were placed in this encounter.   INTERVAL HISTORY: Please see below for problem oriented charting. She returns for further follow-up I also spoke with her daughter over the telephone Her symptoms are about the same She has chronic shortness of breath on minimal exertion She complains of easy bruising but denies bleeding No worsening  neuropathy She is requesting her pain medicine to be changed to a different brand because oxycodone is causing significant itching SUMMARY OF ONCOLOGIC HISTORY: Oncology History Overview Note  High grade serous ER 90%, PR 0% BRCA 1: no loss of expression MMR normal  R1 resection, platinum refractory, progressed on Femara, carboplatin and Gemzar   Left ovarian epithelial cancer ([place])  02/18/2016 Tumor Marker   Patient's tumor was tested for the following markers: CA125 Results of the tumor marker test revealed 45   05/22/2016 Tumor Marker   Patient's tumor was tested for the following markers: CA125 Results of the tumor marker test revealed 53   05/22/2016 Imaging   Outside pelvic US showed 4.1 cm adnexa mass   06/24/2017 Imaging   Ct abdomen and pelvis:  1. Interim finding of moderate ascites within the abdomen and pelvis with additional finding of diffuse nodular infiltration of the omentum and anterior mesenteric fat, the appearance would be consistent with peritoneal carcinomatosis/metastatic disease. Increasing retroperitoneal and upper abdominal adenopathy. 2. Re- demonstrated 3.8 cm cyst in the right adnexa. Enlarging soft tissue density in the left adnexa now with possible cystic component posteriorly. In light of the above findings, concern is for ovarian neoplasm. Correlation with pelvic ultrasound recommended. 3. Small right-sided pleural effusion, new since prior study 4. Stable hypodense splenic lesions since 2017.    06/25/2017 Imaging   US pelvis: 2.9 cm simple appearing cyst in the right ovary. Left ovary grossly unremarkable. Large volume ascites in the pelvis   06/30/2017 - 07/01/2017 Hospital Admission   She was admitted for evaluation of abdominal pain and ascites   07/01/2017 Pathology Results   PERITONEAL/ASCITIC FLUID(SPECIMEN 1 OF 1 COLLECTED 07/01/17): - POORLY DIFFERENTIATED CARCINOMA; SEE  COMMENT Source Peritoneal/Ascitic Fluid, (specimen 1 of 1 collected  07/01/17) Gross Specimen: Received is/are 1000 cc's of brownish fluid. (BS:bs) Prepared: # Smears: 0 # Concentration Technique Slides (i.e. ThinPrep): 1 # Cell Block: 1 Additional Studies: Also received Hematology slide - [id number]. Comment The tumor cells are positive for cytokeratin 7 and Pax-8 but negative for cytokeratin 20, CDX-2, GATA-3, Napsin-A and TTF-1. Based on the immunoprofile a gynecology primary is favored   07/01/2017 Procedure   Successful ultrasound-guided diagnostic and therapeutic paracentesis yielding 2.5 liters of peritoneal fluid   07/07/2017 - 07/09/2017 Hospital Admission   She was admitted for management of malignant ascites   07/08/2017 Procedure   Successful ultrasound-guided therapeutic paracentesis yielding 2.7 liters liters of peritoneal fluid   07/12/2017 Procedure   Successful ultrasound-guided paracentesis yielding 1450 mL of peritoneal fluid   07/18/2017 - 07/24/2017 Hospital Admission   She was admitted for expedited treatment   07/18/2017 Tumor Marker   Patient's tumor was tested for the following markers: CA125 Results of the tumor marker test revealed 1941   07/19/2017 - 02/04/2018 Chemotherapy   The patient had 6 cycles of carboplatin & Taxol for chemotherapy treatment, followed by 3 more cycles of carboplatin only    07/19/2017 - 02/04/2018 Chemotherapy   The patient had carboplatin and taxol   08/06/2017 Procedure   Successful ultrasound-guided therapeutic paracentesis yielding 2.6 liters of peritoneal fluid.   08/09/2017 Tumor Marker   Patient's tumor was tested for the following markers: CA125 Results of the tumor marker test revealed 1665   08/15/2017 Tumor Marker   Patient's tumor was tested for the following markers: CA125 Results of the tumor marker test revealed 937.9   08/20/2017 Imaging   ECHO: Normal LV size with EF 60-65%. Normal RV size and systolic function. No significant valvular abnormalities.   09/18/2017 Imaging    Chest Impression:  1. No evidence thoracic metastasis. 2. Interval increase and RIGHT pleural effusion.  Abdomen / Pelvis Impression:  1. Interval decrease in intraperitoneal free fluid. 2. Interval decrease in omental nodularity in the LEFT ventral peritoneal space. 3. Interval decrease in nodularity associated with the LEFT ovary. 4. Cystic portion of the RIGHT ovary is increased mildly in size.   09/20/2017 Tumor Marker   Patient's tumor was tested for the following markers: CA125 Results of the tumor marker test revealed 347   10/14/2017 Tumor Marker   Patient's tumor was tested for the following markers: CA125 Results of the tumor marker test revealed 307.4   11/04/2017 Tumor Marker   Patient's tumor was tested for the following markers: CA125 Results of the tumor marker test revealed 262.5   11/28/2017 Imaging   1. Interval decrease in right pleural effusion with resolution of right atelectasis seen previously. 2. New small left pleural effusion, symmetric to the right. 3. No intraperitoneal free fluid on the current study. 4. Continued further decrease in left omental disease, appearing less confluent today than on the prior study. 5. Left ovary remains normal in appearance today and the right adnexal cystic lesion is decreased in size compared to prior study. 6. 14 mm pancreatic cyst is unchanged. Continued attention on follow-up imaging recommended. 7. Aortic Atherosclerois (ICD10-170.0)   12/13/2017 Tumor Marker   Patient's tumor was tested for the following markers: CA125 Results of the tumor marker test revealed 197.7   01/03/2018 Tumor Marker   Patient's tumor was tested for the following markers: CA125 Results of the tumor marker test revealed 183.1   01/14/2018 Tumor Marker  Patient's tumor was tested for the following markers: CA125 Results of the tumor marker test revealed 177.4   02/04/2018 Tumor Marker   Patient's tumor was tested for the following markers:  CA125 Results of the tumor marker test revealed 168.5   02/25/2018 Imaging   1. Omental carcinomatosis appears qualitatively stable to slightly decreased. Stable mild peritoneal thickening in the paracolic gutters. Stable right adnexal cyst. No ascites. No new or progressive metastatic disease in the abdomen or pelvis. 2. Small dependent right pleural effusion is increased. 3. Cystic pancreatic body lesion is decreased and now subcentimeter in size, suggesting a benign lesion. 4. Aortic Atherosclerosis (ICD10-I70.0).   03/03/2018 - 03/07/2018 Hospital Admission   She was hospitalized for GI bleed requiring blood transfusions. Xarelto was placed on hold   03/07/2018 PET scan   1. Persistent hazy omental interstitial nodularity but no hypermetabolism or discrete measurable nodules. No abdominal ascites. 2. No findings for metastatic disease involving the chest. 3. Moderate-sized right pleural effusion and small left pleural effusion.    03/20/2018 Pathology Results   1. Ovary and fallopian tube, right - OVARY AND FALLOPIAN TUBE INVOLVED BY SEROUS CARCINOMA. - PARATUBAL CYST. 2. Uterus +/- tubes/ovaries, neoplastic, cervix, left ovary and fallopian tube - LEFT OVARY: HIGH GRADE SEROUS CARCINOMA WITH TREATMENT EFFECT, SPANNING 2.5 CM. CARCINOMA INVOLVES OVARIAN SURFACE. SEE ONCOLOGY TABLE. - LEFT FALLOPIAN TUBE: INVOLVED BY SEROUS CARCINOMA. - UTERUS: -ENDOMETRIUM: INACTIVE ENDOMETRIUM. NO HYPERPLASIA OR MALIGNANCY. -MYOMETRIUM: UNREMARKABLE. NO MALIGNANCY. -SEROSA: INVOLVED BY SEROUS CARCINOMA. - CERVIX: ENDOCERVICAL POLYP. NO MALIGNANCY. 3. Omentum, resection for tumor - INVOLVED BY SEROUS CARCINOMA. 4. Soft tissue, biopsy, mesenteric nodule - INVOLVED BY SEROUS CARCINOMA. Microscopic Comment 2. OVARY or FALLOPIAN TUBE or PRIMARY PERITONEUM: Procedure: Total hysterectomy and bilateral salpingo-oophorectomy. Omentectomy. Mesenteric lymph node biopsy. Specimen Integrity:  Intact. Tumor Site: Left ovary. Ovarian Surface Involvement (required only if applicable): Present. Fallopian Tube Surface Involvement (required only if applicable): Present, bilateral. Tumor Size: 2.5 cm. Histologic Type: High grade serous carcinoma. Histologic Grade: High grade. Implants (required for advanced stage serous/seromucinous borderline tumors only): N/A. Other Tissue/ Organ Involvement: Bilateral fallopian tubes, right ovary, uterine serosa, omentum. Largest Extrapelvic Peritoneal Focus (required only if applicable): Microscopic, estimated 0.5 cm (omentum). Peritoneal/Ascitic Fluid: Prior Positive ([id number]). Treatment Effect (required only for high-grade serous carcinomas): Present in left ovary. CRS2. Regional Lymph Nodes: No lymph nodes submitted/identified. Pathologic Stage Classification (pTNM, AJCC 8th Edition): ypT3b, ypNX Representative Tumor Block: 1A, 1B, 28F, 31F. Comment(s): The right ovary has only surface deposits with a large paratubal cyst. The left ovary has intraparenchymal tumor with associated treatment effect. Thus the tumor location is classified as a left ovarian primary.   03/20/2018 Surgery   Procedure(s) Performed:  1. Exploratory laparotomy with total hysterectomy and bilateral salpingo-oophorectomy 2. Infragastic Omentectomy  3. Debulking to <1cm gross residual disease   Surgeon: Mart Piggs, MD  Specimens: Uterus Cervix, Bilateral tubes / ovaries and omentum. Mesenteric nodule.  Operative Findings: Debulked to gross residual disease <1cm; however there is miliary disease in multiple locations including the majority of the abdominal peritoneum (anterior abdominal wall, bilateral gutters), diaphragm (Right>left), majority of small bowel mesentary. Normal appendix. Normal small uterus. Right ovary with a cystic lesion ~3cm, some adhesive disease of right adnexa to rectum/sigmoid. Gross omental disease, which was resected with the  omentectomy. Smooth liver surface, but again, diaphragmatic disease noted.      03/20/2018 Genetic Testing   Patient has genetic testing done for ER/PR. Results revealed patient has  ER: 90%, PR 0%.    03/31/2018 Tumor Marker   Patient's tumor was tested for the following markers: CA125 Results of the tumor marker test revealed 215.8    Genetic Testing   Patient has genetic testing done for BRCA 1. Results revealed patient has the following: BRCA 1: no loss of expression.    Genetic Testing   Patient has genetic testing done for MMR . Results revealed patient has the following:  MMR: normal   03/31/2018 Genetic Testing   Patient has genetic testing done for BRCA1/2. Results revealed patient has no actionable mutations. She is found to have [place] genetic change of unknown significance   04/21/2018 Imaging   1. No definite findings of residual or recurrent metastatic disease in the abdomen or pelvis status post interval TAHBSO and omentectomy. Stable minimal thickening in the paracolic gutters without discrete peritoneal nodularity. 2. Trace free fluid in the pelvic cul-de-sac. 3. Stable small dependent right pleural effusion. 4. Subcentimeter pancreatic body cystic lesion is stable to slightly decreased. 5. Aortic Atherosclerosis (ICD10-I70.0).   04/21/2018 Tumor Marker   Patient's tumor was tested for the following markers: CA125 Results of the tumor marker test revealed 187.3   04/23/2018 - 09/12/2018 Anti-estrogen oral therapy   She is placed on Femara   05/08/2018 Procedure   Successful ultrasound guided right thoracentesis yielding 800 mL of pleural fluid. Fluid cytology is negative for malignancy    05/28/2018 Tumor Marker   Patient's tumor was tested for the following markers: CA125 Results of the tumor marker test revealed 190   06/30/2018 Tumor Marker   Patient's tumor was tested for the following markers: CA125 Results of the tumor marker test revealed 148    07/23/2018 Imaging   Status post hysterectomy and bilateral salpingo-oophorectomy.  Very mild peritoneal thickening/nodularity, equivocal but worrisome for very mild peritoneal disease. Attention on follow-up is suggested.  Small right pleural effusion with indwelling pleural drain.   07/23/2018 Tumor Marker   Patient's tumor was tested for the following markers: CA125 Results of the tumor marker test revealed 215   09/11/2018 Tumor Marker   Patient's tumor was tested for the following markers: CA-125. Results of the tumor marker test revealed 1323   09/12/2018 Imaging   1. Mildly progressive ascites with some mild degree of nodularity most appreciable along the left adnexa, right paracolic gutter, right upper quadrant, compatible with peritoneal spread of tumor. 2. Small right pleural effusion with indwelling right pleural catheter again noted. Mild increase in atelectasis anteriorly at the right lung base. 3. Aortic Atherosclerosis (ICD10-I70.0). Coronary atherosclerosis. 4. Several tiny hypodense lesions in the spleen are technically nonspecific, but stable. 5. Air fluid level in the rectum compatible with diarrheal process. 6. Prominent bilateral hip arthropathy. Left hip screw noted.   09/22/2018 - 02/02/2019 Chemotherapy   The patient had carboplatin and gemzar   09/29/2018 Tumor Marker   Patient's tumor was tested for the following markers: CA125 Results of the tumor marker test revealed 1219   09/29/2018 Adverse Reaction   Cycle 1 day 8 of Gemzar was omitted due to severe anemia   10/20/2018 Tumor Marker   Patient's tumor was tested for the following markers: CA125 Results of the tumor marker test revealed 402   11/13/2018 Tumor Marker   Patient's tumor was tested for the following markers: CA125 Results of the tumor marker test revealed 437   11/26/2018 Imaging   1. Mild residual peritoneal thickening, overall decreased from 09/11/2018, with resolution of previously  seen  ascites. 2. Tiny residual right pleural effusion with small bore chest tube in place. 3. Supraumbilical midline ventral hernia has a wide neck and contains unobstructed colon. 4.  Aortic atherosclerosis (ICD10-170.0).    01/05/2019 Tumor Marker   Patient's tumor was tested for the following markers: CA125 Results of the tumor marker test revealed 929   02/02/2019 Tumor Marker   Patient's tumor was tested for the following markers: CA125 Results of the tumor marker test revealed 1198   02/09/2019 Imaging   1. New mild ascites. Mild peritoneal thickening shows no significant change, but remains consistent with peritoneal carcinomatosis. 2. Increased tiny bilateral pleural effusions. 3. Partial small bowel obstruction with transition point in the anterior lower abdomen, just deep to the anterior abdominal wall. 4. Stable small ventral abdominal wall hernia containing transverse colon.   02/17/2019 -  Chemotherapy   The patient had PACLitaxel for chemotherapy treatment.    03/05/2019 Tumor Marker   Patient's tumor was tested for the following markers: CA125 Results of the tumor marker test revealed 1243   03/11/2019 Imaging   CXR Small bilateral subpulmonic effusions with basilar atelectasis. These are probably slightly larger than were seen in March.   03/30/2019 Tumor Marker   Patient's tumor was tested for the following markers: CA-125 Results of the tumor marker test revealed 776   05/01/2019 Tumor Marker   Patient's tumor was tested for the following markers: CA-125 Results of the tumor marker test revealed 180   05/19/2019 Tumor Marker   Patient's tumor was tested for the following markers: CA-125 Results of the tumor marker test revealed 163   05/29/2019 Imaging   1. Interval resolution of small volume ascites noted within the pelvis. Continued mild peritoneal thickening consistent with peritoneal carcinomatosis. 2. No new or progressive findings identified within the chest,  abdomen or pelvis. 3. Partial small bowel obstruction described on 02/09/2019 is similar to previous exam. 4. Unchanged appearance of small pleural effusions, right greater than left. 5. Cystic lesion within neck of pancreas is identified, indeterminate measuring 1.7 cm. Increased from 1.3 cm previously. According to consensus criteria follow-up imaging in 6 months with repeat contrast enhanced pancreas protocol MRI or CT is advised. This recommendation follows ACR consensus guidelines: Management of Incidental Pancreatic Cysts: A White Paper of the ACR Incidental Findings Committee. J Am Coll Radiol 1610;96:045-409. 6. Tiny small nonspecific nodules within the right upper lobe are unchanged from previous exam.       REVIEW OF SYSTEMS:   Constitutional: Denies fevers, chills or abnormal weight loss Eyes: Denies blurriness of vision Ears, nose, mouth, throat, and face: Denies mucositis or sore throat Respiratory: Denies cough, dyspnea or wheezes Cardiovascular: Denies palpitation, chest discomfort or lower extremity swelling Gastrointestinal:  Denies nausea, heartburn or change in bowel habits Skin: Denies abnormal skin rashes Lymphatics: Denies new lymphadenopathy or easy bruising Neurological:Denies numbness, tingling or new weaknesses Behavioral/Psych: Mood is stable, no new changes  All other systems were reviewed with the patient and are negative.  I have reviewed the past medical history, past surgical history, social history and family history with the patient and they are unchanged from previous note.  ALLERGIES:  is allergic to codeine.  MEDICATIONS:  Current Outpatient Medications  Medication Sig Dispense Refill  . albuterol (PROAIR HFA) 108 (90 Base) MCG/ACT inhaler Inhale 2 puffs into the lungs every 6 (six) hours as needed for wheezing or shortness of breath. 18 g 5  . ALPRAZolam (XANAX) 0.25 MG tablet Take 1 tablet (0.25  mg total) by mouth at bedtime as needed for anxiety.  5 tablet 0  . azelastine (ASTELIN) 0.1 % nasal spray Place 2 sprays into both nostrils 2 (two) times daily as needed for allergies.     . Cholecalciferol (VITAMIN D) 2000 units CAPS Take 2,000 Units by mouth daily.     Marland Kitchen dicyclomine (BENTYL) 10 MG capsule TAKE 1 CAPSULE(10 MG) BY MOUTH TWICE DAILY 60 capsule 1  . diltiazem (CARDIZEM CD) 180 MG 24 hr capsule Take 1 capsule (180 mg total) by mouth 2 (two) times a day. 180 capsule 3  . diphenhydrAMINE (BENADRYL) 25 mg capsule Take 25 mg by mouth every 8 (eight) hours as needed for itching.     . DULoxetine (CYMBALTA) 60 MG capsule Take 1 capsule (60 mg total) by mouth at bedtime. 30 capsule 3  . fluticasone (FLONASE) 50 MCG/ACT nasal spray Place 2 sprays into both nostrils 2 (two) times daily as needed (FOR NASAL CONGESTION.).     Marland Kitchen furosemide (LASIX) 40 MG tablet Take 1 tablet (40 mg total) by mouth daily. 90 tablet 3  . halobetasol (ULTRAVATE) 0.05 % cream Apply 1 application topically 2 (two) times daily as needed (psoriasis).     Marland Kitchen levothyroxine (SYNTHROID, LEVOTHROID) 137 MCG tablet Take 137 mcg by mouth daily before breakfast. For thyroid therapy    . Magnesium 400 MG CAPS Take 400 mg by mouth 2 (two) times a day.     . metoprolol tartrate (LOPRESSOR) 25 MG tablet Take 1 tablet (25 mg total) by mouth 2 (two) times daily as needed. 180 tablet 3  . mirtazapine (REMERON) 15 MG tablet Take 1 tablet (15 mg total) by mouth at bedtime. 30 tablet 9  . morphine (MSIR) 15 MG tablet Take 1 tablet (15 mg total) by mouth every 4 (four) hours as needed for severe pain. 30 tablet 0  . polyvinyl alcohol (LIQUIFILM TEARS) 1.4 % ophthalmic solution Place 1 drop into both eyes 2 (two) times daily as needed for dry eyes.     . potassium chloride SA (K-DUR,KLOR-CON) 20 MEQ tablet TAKE 1 TABLET BY MOUTH DAILY AND TAKE 1 ADDITIONAL TABLET WHEN TAKING EXTRA LASIX DOSE (Patient taking differently: Take 20 mEq by mouth daily. ) 60 tablet 6  . STIOLTO RESPIMAT 2.5-2.5  MCG/ACT AERS INHALE 2 PUFFS INTO THE LUNGS DAILY (Patient taking differently: Inhale 2 puffs into the lungs daily. ) 1 g 3  . XARELTO 20 MG TABS tablet TAKE 1 TABLET BY MOUTH EVERY DAY AFTER SUPPER (Patient taking differently: Take 20 mg by mouth daily with supper. ) 90 tablet 3   No current facility-administered medications for this visit.    Facility-Administered Medications Ordered in Other Visits  Medication Dose Route Frequency Provider Last Rate Last Dose  . 0.9 %  sodium chloride infusion (Manually program via Guardrails IV Fluids)  250 mL Intravenous Once Heath Lark, MD   Stopped at 12/16/18 1500    PHYSICAL EXAMINATION: ECOG PERFORMANCE STATUS: 2 - Symptomatic, <50% confined to bed  Vitals:   06/02/19 1000  BP: (!) 136/53  Pulse: 96  Resp: 18  Temp: 98.7 F (37.1 C)  SpO2: 98%   Filed Weights   06/02/19 1000  Weight: 163 lb 6.4 oz (74.1 kg)    GENERAL:alert, no distress and comfortable SKIN: Noted excessive skin bruises Musculoskeletal:no cyanosis of digits and no clubbing  NEURO: alert & oriented x 3 with fluent speech, no focal motor/sensory deficits  LABORATORY DATA:  I have reviewed  the data as listed    Component Value Date/Time   NA 136 05/19/2019 1040   NA 135 (L) 09/24/2017 0945   K 3.5 05/19/2019 1040   K 3.7 09/24/2017 0945   CL 98 05/19/2019 1040   CO2 31 05/19/2019 1040   CO2 30 (H) 09/24/2017 0945   GLUCOSE 133 (H) 05/19/2019 1040   GLUCOSE 101 09/24/2017 0945   BUN 21 05/19/2019 1040   BUN 15.3 09/24/2017 0945   CREATININE 1.11 (H) 05/19/2019 1040   CREATININE 0.8 09/24/2017 0945   CALCIUM 8.7 (L) 05/19/2019 1040   CALCIUM 9.1 09/24/2017 0945   PROT 6.7 05/19/2019 1040   PROT 6.6 09/24/2017 0945   ALBUMIN 3.2 (L) 05/19/2019 1040   ALBUMIN 3.3 (L) 09/24/2017 0945   AST 16 05/19/2019 1040   AST 16 09/24/2017 0945   ALT 11 05/19/2019 1040   ALT 12 09/24/2017 0945   ALKPHOS 56 05/19/2019 1040   ALKPHOS 52 09/24/2017 0945   BILITOT 0.4  05/19/2019 1040   BILITOT 1.13 09/24/2017 0945   GFRNONAA 46 (L) 05/19/2019 1040   GFRAA 53 (L) 05/19/2019 1040    No results found for: SPEP, UPEP  Lab Results  Component Value Date   WBC 6.7 05/19/2019   NEUTROABS 4.1 05/19/2019   HGB 6.9 (LL) 05/19/2019   HCT 23.4 (L) 05/19/2019   MCV 80.1 05/19/2019   PLT 248 05/19/2019      Chemistry      Component Value Date/Time   NA 136 05/19/2019 1040   NA 135 (L) 09/24/2017 0945   K 3.5 05/19/2019 1040   K 3.7 09/24/2017 0945   CL 98 05/19/2019 1040   CO2 31 05/19/2019 1040   CO2 30 (H) 09/24/2017 0945   BUN 21 05/19/2019 1040   BUN 15.3 09/24/2017 0945   CREATININE 1.11 (H) 05/19/2019 1040   CREATININE 0.8 09/24/2017 0945      Component Value Date/Time   CALCIUM 8.7 (L) 05/19/2019 1040   CALCIUM 9.1 09/24/2017 0945   ALKPHOS 56 05/19/2019 1040   ALKPHOS 52 09/24/2017 0945   AST 16 05/19/2019 1040   AST 16 09/24/2017 0945   ALT 11 05/19/2019 1040   ALT 12 09/24/2017 0945   BILITOT 0.4 05/19/2019 1040   BILITOT 1.13 09/24/2017 0945       RADIOGRAPHIC STUDIES: I have reviewed imaging study with the patient I have personally reviewed the radiological images as listed and agreed with the findings in the report. Dg Chest 2 View  Result Date: 05/19/2019 CLINICAL DATA:  History of ovarian cancer. Shortness of breath. History of atrial fibrillation. EXAM: CHEST - 2 VIEW COMPARISON:  Chest x-rays dated 04/26/2019, 03/30/2019 in 12/17/2017. FINDINGS: Heart size and mediastinal contours are stable. RIGHT chest wall Port-A-Cath is stable in position with tip at the level of the lower SVC. Lungs are hyperexpanded suggesting COPD. Chronic bibasilar opacities, likely atelectasis and/or small pleural effusions. No new lung findings. No pneumothorax seen. No acute or suspicious osseous finding. IMPRESSION: 1. No active cardiopulmonary disease. No evidence of pneumonia or pulmonary edema. 2. Hyperexpanded lungs suggesting COPD. 3. Chronic  bibasilar opacities, likely mild atelectasis and/or small pleural effusions. Electronically Signed   By: Franki Cabot M.D.   On: 05/19/2019 12:07   Ct Chest W Contrast  Result Date: 05/29/2019 CLINICAL DATA:  Restaging epithelial ovarian/fallopian tube/primary peritoneal carcinoma. EXAM: CT CHEST, ABDOMEN, AND PELVIS WITH CONTRAST TECHNIQUE: Multidetector CT imaging of the chest, abdomen and pelvis was performed following the  standard protocol during bolus administration of intravenous contrast. CONTRAST:  169m OMNIPAQUE IOHEXOL 300 MG/ML  SOLN COMPARISON:  CT chest 04/26/2019 and CT abdomen pelvis 02/09/2019. FINDINGS: CT CHEST FINDINGS Cardiovascular: The heart size is normal. Aortic atherosclerosis. Three vessel coronary artery calcifications. No pericardial effusion. Mediastinum/Nodes: No enlarged mediastinal, hilar, or axillary lymph nodes. Thyroid gland, trachea, and esophagus demonstrate no significant findings. Lungs/Pleura: Trace left pleural effusion and small to moderate right pleural effusion are unchanged from 04/26/2019. Multiple small, less than 5 mm pulmonary nodules are identified within the right upper lobe. These are similar to the previous exam. Index nodule in the right upper lobe measures 3 mm, image 31/6. Musculoskeletal: Spondylosis identified within the thoracic spine. No aggressive lytic or sclerotic bone lesions identified. CT ABDOMEN PELVIS FINDINGS Hepatobiliary: No focal liver abnormality. Previous cholecystectomy. There is chronic increase caliber of the common bile duct which measures up to 11 mm. Pancreas: Unremarkable. There is a cystic lesion within the neck of pancreas which measures up to 1.7 cm, image 57/4. Previously this measured up to 1.3 cm. Spleen: There are scattered low attenuation foci within the spleen. Unchanged from previous exam. Adrenals/Urinary Tract: Normal appearance of the adrenal glands. No kidney mass or hydronephrosis. The urinary bladder appears  normal. Stomach/Bowel: Dilatation of the small and large bowel loops are again identified. The small bowel loops measure up to 2.8 cm, similar to the previous exam. The distal small bowel loops are decreased in caliber suggesting the low-grade partial obstruction as mentioned previously. Vascular/Lymphatic: Aortic atherosclerosis. No abdominopelvic adenopathy. Reproductive: Previous hysterectomy. Decreased previously noted small volume ascites. No fluid collections identified. Mild peritoneal thickening in the pelvis and along the pericolic gutters noted, unchanged. Other: Ventral abdominal wall hernia containing nonobstructed loop of transverse colon. Unchanged from previous exam. Musculoskeletal: No acute or significant osseous findings. Previous ORIF of the left femur. Multi level spondylosis identified within the lumbar spine. No aggressive lytic or sclerotic bone lesions. IMPRESSION: 1. Interval resolution of small volume ascites noted within the pelvis. Continued mild peritoneal thickening consistent with peritoneal carcinomatosis. 2. No new or progressive findings identified within the chest, abdomen or pelvis. 3. Partial small bowel obstruction described on 02/09/2019 is similar to previous exam. 4. Unchanged appearance of small pleural effusions, right greater than left. 5. Cystic lesion within neck of pancreas is identified, indeterminate measuring 1.7 cm. Increased from 1.3 cm previously. According to consensus criteria follow-up imaging in 6 months with repeat contrast enhanced pancreas protocol MRI or CT is advised. This recommendation follows ACR consensus guidelines: Management of Incidental Pancreatic Cysts: A White Paper of the ACR Incidental Findings Committee. J Am Coll Radiol 28295;62:130-865 6. Tiny small nonspecific nodules within the right upper lobe are unchanged from previous exam. Electronically Signed   By: TKerby MoorsM.D.   On: 05/29/2019 15:13   Ct Abdomen Pelvis W  Contrast  Result Date: 05/29/2019 CLINICAL DATA:  Restaging epithelial ovarian/fallopian tube/primary peritoneal carcinoma. EXAM: CT CHEST, ABDOMEN, AND PELVIS WITH CONTRAST TECHNIQUE: Multidetector CT imaging of the chest, abdomen and pelvis was performed following the standard protocol during bolus administration of intravenous contrast. CONTRAST:  1072mOMNIPAQUE IOHEXOL 300 MG/ML  SOLN COMPARISON:  CT chest 04/26/2019 and CT abdomen pelvis 02/09/2019. FINDINGS: CT CHEST FINDINGS Cardiovascular: The heart size is normal. Aortic atherosclerosis. Three vessel coronary artery calcifications. No pericardial effusion. Mediastinum/Nodes: No enlarged mediastinal, hilar, or axillary lymph nodes. Thyroid gland, trachea, and esophagus demonstrate no significant findings. Lungs/Pleura: Trace left pleural effusion and small to  moderate right pleural effusion are unchanged from 04/26/2019. Multiple small, less than 5 mm pulmonary nodules are identified within the right upper lobe. These are similar to the previous exam. Index nodule in the right upper lobe measures 3 mm, image 31/6. Musculoskeletal: Spondylosis identified within the thoracic spine. No aggressive lytic or sclerotic bone lesions identified. CT ABDOMEN PELVIS FINDINGS Hepatobiliary: No focal liver abnormality. Previous cholecystectomy. There is chronic increase caliber of the common bile duct which measures up to 11 mm. Pancreas: Unremarkable. There is a cystic lesion within the neck of pancreas which measures up to 1.7 cm, image 57/4. Previously this measured up to 1.3 cm. Spleen: There are scattered low attenuation foci within the spleen. Unchanged from previous exam. Adrenals/Urinary Tract: Normal appearance of the adrenal glands. No kidney mass or hydronephrosis. The urinary bladder appears normal. Stomach/Bowel: Dilatation of the small and large bowel loops are again identified. The small bowel loops measure up to 2.8 cm, similar to the previous exam. The  distal small bowel loops are decreased in caliber suggesting the low-grade partial obstruction as mentioned previously. Vascular/Lymphatic: Aortic atherosclerosis. No abdominopelvic adenopathy. Reproductive: Previous hysterectomy. Decreased previously noted small volume ascites. No fluid collections identified. Mild peritoneal thickening in the pelvis and along the pericolic gutters noted, unchanged. Other: Ventral abdominal wall hernia containing nonobstructed loop of transverse colon. Unchanged from previous exam. Musculoskeletal: No acute or significant osseous findings. Previous ORIF of the left femur. Multi level spondylosis identified within the lumbar spine. No aggressive lytic or sclerotic bone lesions. IMPRESSION: 1. Interval resolution of small volume ascites noted within the pelvis. Continued mild peritoneal thickening consistent with peritoneal carcinomatosis. 2. No new or progressive findings identified within the chest, abdomen or pelvis. 3. Partial small bowel obstruction described on 02/09/2019 is similar to previous exam. 4. Unchanged appearance of small pleural effusions, right greater than left. 5. Cystic lesion within neck of pancreas is identified, indeterminate measuring 1.7 cm. Increased from 1.3 cm previously. According to consensus criteria follow-up imaging in 6 months with repeat contrast enhanced pancreas protocol MRI or CT is advised. This recommendation follows ACR consensus guidelines: Management of Incidental Pancreatic Cysts: A White Paper of the ACR Incidental Findings Committee. J Am Coll Radiol 6295;28:413-244. 6. Tiny small nonspecific nodules within the right upper lobe are unchanged from previous exam. Electronically Signed   By: Kerby Moors M.D.   On: 05/29/2019 15:13    All questions were answered. The patient knows to call the clinic with any problems, questions or concerns. No barriers to learning was detected.  I spent 30 minutes counseling the patient face to  face. The total time spent in the appointment was 40 minutes and more than 50% was on counseling and review of test results  Heath Lark, MD 06/02/2019 1:47 PM

## 2019-06-02 NOTE — Telephone Encounter (Signed)
I talk with patients daughter regarding schedule

## 2019-06-02 NOTE — Assessment & Plan Note (Signed)
I have reviewed CT imaging with the patient and family She has positive response to treatment The patient wants to continue treatment indefinitely I will schedule her to continue treatment every other week with dose adjustment and transfusion support

## 2019-06-02 NOTE — Assessment & Plan Note (Signed)
She denies recent worsening neuropathy We will continue with reduced dose Taxol

## 2019-06-02 NOTE — Assessment & Plan Note (Signed)
She is on Xarelto for chronic atrial fibrillation with excessive bruising She has appointment to meet with cardiologist to discuss treatment options I will defer to her cardiologist for further management

## 2019-06-02 NOTE — Assessment & Plan Note (Signed)
She has persistent pleural effusion noted on CT imaging The amount of fluid is much I do not recommend thoracentesis right now

## 2019-06-02 NOTE — Assessment & Plan Note (Signed)
She has poorly controlled pain She felt that oxycodone is causing itching We discussed treatment options She is willing to try IR morphine

## 2019-06-02 NOTE — Progress Notes (Signed)
Cardiology Office Note  Date: 06/03/2019   ID: [name], DOB [birth year], MRN [id number]  PCP:  Asencion Noble, MD  Cardiologist:  Rozann Lesches, MD Electrophysiologist:  None   Chief Complaint  Patient presents with   Atrial Fibrillation    History of Present Illness: [name] is an 85 y.o. female last assessed via recent telehealth encounter with [name] on August 10.  She presents with her daughter for a follow-up visit.  She tells me that recent oncology follow-up was somewhat reassuring, CT imaging looked better and her cancer markers are down.  She is still undergoing chemotherapy treatments.  She reports intermittent sense of palpitations, not always fast however. She has been on diltiazem CD at 180 mg twice daily most recently with as needed metoprolol 25 mg to take for interval RVR.  She has not used any recent metoprolol.  She also continues on Xarelto.  Has a fairly large ecchymosis on her left forearm, but states that she bumped it on the bathroom door, this was not spontaneous.  She remains chronically anemic based on blood work, recent platelets were normal.  I reviewed her renal function, creatinine clearance is 46.  We discussed reducing Xarelto to 15 mg daily.  Past Medical History:  Diagnosis Date   Anxiety    Chronic blood loss anemia    03-04-2018 diverticular bleed and rectal bleeding,  transfused 2 units PRBCs 03-08-2018   Colitis    COPD (chronic obstructive pulmonary disease) (HCC)    Dr. Lamonte Sakai   Depression    Diastolic CHF, chronic ([place])    Diverticulosis    Family history of colon cancer    Fibromyalgia    Genetic testing 04/07/2018   MyRisk (35 genes) @ [facility] - No pathogenic mutations detected   GERD (gastroesophageal reflux disease)    Hemorrhoids    Hiatal hernia    Hip pain 07/2018   Right Hip Pain   History of rectal polyps    History of shingles    Hypothyroidism    IBS (irritable bowel syndrome)     Lymphocytic colitis    Dr. Henrene Pastor   Malignant ascites    Admission 06/2017 abdominal s/p parencentesis 07-01-2017 2.5L, 07-08-2016  2.7L, 07-12-2017  1462m   Neuropathy due to chemotherapeutic drug ([place]    On home O2    Osteoporosis    [place]ian cancer ([place]    Chemotherapy - Dr. GAlvy Bimler  Paroxysmal atrial fibrillation ([place]    Xarelto stopped 03-07-2018 due to lower GI bleed   Pleural effusion    s/p  right thoracentesis, 02-2018 1.3L and 03-17-2018 right thoracentesis 6476m, post cxr no residual effusion   Psoriatic arthritis ([place]   Schatzki's ring 2013   Seasonal allergic rhinitis     Past Surgical History:  Procedure Laterality Date   CARDIOVASCULAR STRESS TEST  09/23/2012   Low risk lexiscan nuclear study w/ apical thinning but no evidence of ischemia/  normal LV function and wall motion , ef 75%   CATARACT EXTRACTION W/ INTRAOCULAR LENS  IMPLANT, BILATERAL  10/2016   CHEST TUBE INSERTION Right 06/24/2018   Procedure: INSERTION PLEURAL DRAINAGE CATHETER;  Surgeon: GeGrace IsaacMD;  [place] Service: Thoracic;  Laterality: Right;   COLONOSCOPY     DEBULKING N/A 03/20/2018   Procedure: DEBULKING;  Surgeon: PhIsabel CapriceMD;  [facility]: [facility];  Service: Gynecology;  Laterality: N/A;   EXAM UNDER ANESTHESIA WITH MANIPULATION OF  KNEE Left 12-20-2003  dr Noemi Chapel   post TKA   FEMUR IM NAIL Left 12/11/2013   Procedure: INTRAMEDULLARY (IM) NAIL FEMORAL;  Surgeon: [name], MD;  Location: [facility];  Service: Orthopedics;  Laterality: Left;   HYSTERECTOMY ABDOMINAL WITH SALPINGO-OOPHORECTOMY Bilateral 03/20/2018   Procedure: TOTAL HYSTERECTOMY ABDOMINAL WITH BILATERAL  SALPINGO-OOPHORECTOMY;  Surgeon: Isabel Caprice, MD;  [facility]: [facility];  Service: Gynecology;  Laterality: Bilateral;   IR FLUORO GUIDE PORT INSERTION RIGHT  07/22/2017   IR PARACENTESIS  07/12/2017   IR THORACENTESIS ASP PLEURAL SPACE W/IMG GUIDE  03/17/2018   IR  THORACENTESIS ASP PLEURAL SPACE W/IMG GUIDE  05/08/2018   IR US GUIDE VASC ACCESS RIGHT  07/22/2017   KNEE ARTHROSCOPY W/ LATERAL RELEASE Left 09-03-2005   dr Noemi Chapel  [facility]   w/  Lysis Adhesions,  excision loose body's   LAPAROSCOPIC CHOLECYSTECTOMY  12-04-2010  dr zeigler   LAPAROTOMY N/A 03/20/2018   Procedure: EXPLORATORY LAPAROTOMY;  Surgeon: Isabel Caprice, MD;  [facility]: [facility];  Service: Gynecology;  Laterality: N/A;   OMENTECTOMY N/A 03/20/2018   Procedure: OMENTECTOMY;  Surgeon: Isabel Caprice, MD;  [facility];  Service: Gynecology;  Laterality: N/A;   OTHER SURGICAL HISTORY  06/24/2018   Plurex Catheter inserted into the lungs   TOTAL KNEE ARTHROPLASTY Left 09-08-2003   dr Noemi Chapel  [place]  08/06/2012   Procedure: TOTAL KNEE REVISION;  Surgeon: [name], MD;  Location: [facility];  Service: Orthopedics;  Laterality: Left;  Left Total Knee Arthroplasty Revision   TRANSTHORACIC ECHOCARDIOGRAM  08/20/2017   ef 60-65%,  grade 1 diastolic dysfunction/  trivial AR and TR   Uterine polypectomy      Current Outpatient Medications  Medication Sig Dispense Refill   albuterol (PROAIR HFA) 108 (90 Base) MCG/ACT inhaler Inhale 2 puffs into the lungs every 6 (six) hours as needed for wheezing or shortness of breath. 18 g 5   ALPRAZolam (XANAX) 0.25 MG tablet Take 1 tablet (0.25 mg total) by mouth at bedtime as needed for anxiety. 5 tablet 0   azelastine (ASTELIN) 0.1 % nasal spray Place 2 sprays into both nostrils 2 (two) times daily as needed for allergies.      Cholecalciferol (VITAMIN D) 2000 units CAPS Take 2,000 Units by mouth daily.      dicyclomine (BENTYL) 10 MG capsule TAKE 1 CAPSULE(10 MG) BY MOUTH TWICE DAILY 60 capsule 1   diltiazem (CARDIZEM CD) 180 MG 24 hr capsule Take 1 capsule (180 mg total) by mouth 2 (two) times a day. 180 capsule 3   diphenhydrAMINE (BENADRYL) 25 mg capsule Take 25 mg by mouth every 8 (eight) hours as needed for  itching.      DULoxetine (CYMBALTA) 60 MG capsule Take 1 capsule (60 mg total) by mouth at bedtime. 30 capsule 3   fluticasone (FLONASE) 50 MCG/ACT nasal spray Place 2 sprays into both nostrils 2 (two) times daily as needed (FOR NASAL CONGESTION.).      furosemide (LASIX) 40 MG tablet Take 1 tablet (40 mg total) by mouth daily. 90 tablet 3   halobetasol (ULTRAVATE) 0.05 % cream Apply 1 application topically 2 (two) times daily as needed (psoriasis).      levothyroxine (SYNTHROID, LEVOTHROID) 137 MCG tablet Take 137 mcg by mouth daily before breakfast. For thyroid therapy     Magnesium 400 MG CAPS Take 400 mg by mouth 2 (two) times a day.  metoprolol tartrate (LOPRESSOR) 25 MG tablet Take 1 tablet (25 mg total) by mouth 2 (two) times daily as needed. 180 tablet 3   mirtazapine (REMERON) 15 MG tablet Take 1 tablet (15 mg total) by mouth at bedtime. 30 tablet 9   morphine (MSIR) 15 MG tablet Take 1 tablet (15 mg total) by mouth every 4 (four) hours as needed for severe pain. 30 tablet 0   polyvinyl alcohol (LIQUIFILM TEARS) 1.4 % ophthalmic solution Place 1 drop into both eyes 2 (two) times daily as needed for dry eyes.      potassium chloride SA (K-DUR,KLOR-CON) 20 MEQ tablet TAKE 1 TABLET BY MOUTH DAILY AND TAKE 1 ADDITIONAL TABLET WHEN TAKING EXTRA LASIX DOSE (Patient taking differently: Take 20 mEq by mouth daily. ) 60 tablet 6   STIOLTO RESPIMAT 2.5-2.5 MCG/ACT AERS INHALE 2 PUFFS INTO THE LUNGS DAILY (Patient taking differently: Inhale 2 puffs into the lungs daily. ) 1 g 3   Rivaroxaban (XARELTO) 15 MG TABS tablet Take 1 tablet (15 mg total) by mouth daily with supper. 30 tablet 6   No current facility-administered medications for this visit.    Facility-Administered Medications Ordered in Other Visits  Medication Dose Route Frequency Provider Last Rate Last Dose   0.9 %  sodium chloride infusion (Manually program via Guardrails IV Fluids)  250 mL Intravenous Once Heath Lark,  MD   Stopped at 12/16/18 1500   Allergies:  Codeine   Social History: The patient  reports that she quit smoking about 38 years ago. Her smoking use included cigarettes. She has a 20.00 pack-year smoking history. She has never used smokeless tobacco. She reports previous alcohol use. She reports that she does not use drugs.   ROS:  Please see the history of present illness. Otherwise, complete review of systems is positive for fatigue.  All other systems are reviewed and negative.   Physical Exam: VS:  BP 122/62    Pulse 92    Temp (!) 97.4 F (36.3 C)    Ht 5' 4"  (1.626 m)    Wt 172 lb (78 kg)    SpO2 98%    BMI 29.52 kg/m , BMI Body mass index is 29.52 kg/m.  Wt Readings from Last 3 Encounters:  06/03/19 172 lb (78 kg)  06/02/19 163 lb 6.4 oz (74.1 kg)  05/19/19 174 lb 1.6 oz (79 kg)    General: Chronically ill-appearing elderly woman in a wheelchair. HEENT: Conjunctiva and lids normal, wearing a mask. Neck: Supple, no elevated JVP or carotid bruits, no thyromegaly. Lungs: Clear to auscultation, nonlabored breathing at rest. Cardiac: Regular rate and rhythm, no gallop.   Abdomen: Soft, nontender, bowel sounds present. Extremities: No pitting edema, distal pulses 2+. Skin: Warm and dry. Musculoskeletal: No kyphosis. Neuropsychiatric: Alert and oriented x3, affect grossly appropriate.  ECG:  An ECG dated 04/26/2019 was personally reviewed today and demonstrated:  Sinus rhythm with vertical axis and low voltage.  Recent Labwork: 11/24/2018: Magnesium 1.7 04/17/2019: B Natriuretic Peptide 115.0 05/19/2019: ALT 11; AST 16; BUN 21; Creatinine 1.11; Hemoglobin 6.9; Platelets 248; Potassium 3.5; Sodium 136     Component Value Date/Time   CHOL 147 11/14/2017 1413    Other Studies Reviewed Today:  Cardiac monitor 05/14/2019: 72-hour event recorder reviewed.  Predominant rhythm is sinus with heart rate ranging from 55 bpm up to 119 bpm and average heart rate 89 bpm.  Periods of  paroxysmal atrial fibrillation and flutter were noted, with fastest nonsustained heart rate in  the 160s.  There were no spontaneous or posttermination pauses, and no unusual degree of bradycardia.  Cardiac catheterization 08/20/2017: Study Conclusions  - Left ventricle: The cavity size was normal. Wall thickness was   normal. Systolic function was normal. The estimated ejection   fraction was in the range of 60% to 65%. Wall motion was normal;   there were no regional wall motion abnormalities. Doppler   parameters are consistent with abnormal left ventricular   relaxation (grade 1 diastolic dysfunction). - Aortic valve: There was no stenosis. There was trivial   regurgitation. - Mitral valve: Mildly calcified annulus. There was no significant   regurgitation. - Right ventricle: The cavity size was normal. Systolic function   was normal. - Tricuspid valve: Peak RV-RA gradient (S): 27 mm Hg. - Pulmonary arteries: PA peak pressure: 30 mm Hg (S). - Inferior vena cava: The vessel was normal in size. The   respirophasic diameter changes were in the normal range (= 50%),   consistent with normal central venous pressure.  Impressions:  - Normal LV size with EF 60-65%. Normal RV size and systolic   function. No significant valvular abnormalities.  CT chest, abdomen, and pelvis 05/29/2019: IMPRESSION: 1. Interval resolution of small volume ascites noted within the pelvis. Continued mild peritoneal thickening consistent with peritoneal carcinomatosis. 2. No new or progressive findings identified within the chest, abdomen or pelvis. 3. Partial small bowel obstruction described on 02/09/2019 is similar to previous exam. 4. Unchanged appearance of small pleural effusions, right greater than left. 5. Cystic lesion within neck of pancreas is identified, indeterminate measuring 1.7 cm. Increased from 1.3 cm previously. According to consensus criteria follow-up imaging in 6 months  with repeat contrast enhanced pancreas protocol MRI or CT is advised. This recommendation follows ACR consensus guidelines: Management of Incidental Pancreatic Cysts: A White Paper of the ACR Incidental Findings Committee. J Am Coll Radiol 8676;19:509-326. 6. Tiny small nonspecific nodules within the right upper lobe are unchanged from previous exam.  Assessment and Plan:  1.  Paroxysmal atrial fibrillation with CHADSVASC score of 3.  Plan to continue Cardizem CD at 180 mg twice daily with as needed use of metoprolol 25 mg tablets for breakthrough RVR.  I expect she will continue to have difficulties with recurrent arrhythmia particularly in light of ongoing treatments for ovarian cancer and other comorbidities.  Plan to reduce Xarelto to 15 mg daily based on most recent creatinine clearance of 46.  2.  Ovarian cancer undergoing chemotherapy.  She continues to follow with Oncology.  3.  Recurring right pleural effusion managed previously with Pleurx catheter and follow-up by Dr. Servando Snare.  Follow-up chest CT indicates small to moderate sized right pleural effusion that is unchanged from July.  4.  Mild renal insufficiency, creatinine clearance 46.  Medication Adjustments/Labs and Tests Ordered: Current medicines are reviewed at length with the patient today.  Concerns regarding medicines are outlined above.   Tests Ordered: No orders of the defined types were placed in this encounter.   Medication Changes: Meds ordered this encounter  Medications   Rivaroxaban (XARELTO) 15 MG TABS tablet    Sig: Take 1 tablet (15 mg total) by mouth daily with supper.    Dispense:  30 tablet    Refill:  6    06/03/2019 dose decrease    Disposition:  Follow up 6 weeks in the [place] office.  Signed, Satira Sark, MD, [facility] 06/03/2019 10:52 AM    [place] [facility]  [place], [place], [place] [address] Phone: [phone]; Fax: [phone]

## 2019-06-03 ENCOUNTER — Ambulatory Visit (INDEPENDENT_AMBULATORY_CARE_PROVIDER_SITE_OTHER): Payer: Medicare Other | Admitting: Cardiology

## 2019-06-03 ENCOUNTER — Encounter: Payer: Self-pay | Admitting: Cardiology

## 2019-06-03 VITALS — BP 122/62 | HR 92 | Temp 97.4°F | Ht 64.0 in | Wt 172.0 lb

## 2019-06-03 DIAGNOSIS — C569 Malignant neoplasm of unspecified ovary: Secondary | ICD-10-CM

## 2019-06-03 DIAGNOSIS — I10 Essential (primary) hypertension: Secondary | ICD-10-CM | POA: Diagnosis not present

## 2019-06-03 DIAGNOSIS — I4819 Other persistent atrial fibrillation: Secondary | ICD-10-CM

## 2019-06-03 DIAGNOSIS — J90 Pleural effusion, not elsewhere classified: Secondary | ICD-10-CM

## 2019-06-03 MED ORDER — RIVAROXABAN 15 MG PO TABS: 15.0000 mg | ORAL_TABLET | Freq: Every day | ORAL | 6 refills | Status: DC

## 2019-06-03 NOTE — Patient Instructions (Addendum)
Medication Instructions:   Your physician has recommended you make the following change in your medication:   Decrease xarelto to 15 mg by mouth daily  Continue all other medications the same  Labwork:  NONE  Testing/Procedures:  NONE  Follow-Up:  Your physician recommends that you schedule a follow-up appointment in: 6 weeks at the [place] office.   Any Other Special Instructions Will Be Listed Below (If Applicable).  If you need a refill on your cardiac medications before your next appointment, please call your pharmacy.

## 2019-06-04 ENCOUNTER — Other Ambulatory Visit: Payer: Self-pay | Admitting: Hematology and Oncology

## 2019-06-05 ENCOUNTER — Telehealth: Payer: Self-pay | Admitting: *Deleted

## 2019-06-05 ENCOUNTER — Other Ambulatory Visit: Payer: Self-pay

## 2019-06-05 ENCOUNTER — Telehealth: Payer: Self-pay

## 2019-06-05 ENCOUNTER — Telehealth: Payer: Self-pay | Admitting: Medical

## 2019-06-05 ENCOUNTER — Other Ambulatory Visit: Payer: Medicare Other

## 2019-06-05 ENCOUNTER — Inpatient Hospital Stay: Payer: Medicare Other

## 2019-06-05 ENCOUNTER — Inpatient Hospital Stay (HOSPITAL_BASED_OUTPATIENT_CLINIC_OR_DEPARTMENT_OTHER): Payer: Medicare Other | Admitting: Medical

## 2019-06-05 VITALS — BP 128/64 | HR 86 | Temp 98.0°F | Resp 18

## 2019-06-05 DIAGNOSIS — C562 Malignant neoplasm of left ovary: Secondary | ICD-10-CM

## 2019-06-05 DIAGNOSIS — D638 Anemia in other chronic diseases classified elsewhere: Secondary | ICD-10-CM

## 2019-06-05 DIAGNOSIS — Z5111 Encounter for antineoplastic chemotherapy: Secondary | ICD-10-CM | POA: Diagnosis not present

## 2019-06-05 DIAGNOSIS — Z87891 Personal history of nicotine dependence: Secondary | ICD-10-CM

## 2019-06-05 DIAGNOSIS — B029 Zoster without complications: Secondary | ICD-10-CM | POA: Insufficient documentation

## 2019-06-05 LAB — CBC WITH DIFFERENTIAL/PLATELET
Abs Immature Granulocytes: 0.06 10*3/uL (ref 0.00–0.07)
Basophils Absolute: 0 10*3/uL (ref 0.0–0.1)
Basophils Relative: 0 %
Eosinophils Absolute: 0.1 10*3/uL (ref 0.0–0.5)
Eosinophils Relative: 2 %
HCT: 28.1 % — ABNORMAL LOW (ref 36.0–46.0)
Hemoglobin: 8.6 g/dL — ABNORMAL LOW (ref 12.0–15.0)
Immature Granulocytes: 1 %
Lymphocytes Relative: 12 %
Lymphs Abs: 0.9 10*3/uL (ref 0.7–4.0)
MCH: 25 pg — ABNORMAL LOW (ref 26.0–34.0)
MCHC: 30.6 g/dL (ref 30.0–36.0)
MCV: 81.7 fL (ref 80.0–100.0)
Monocytes Absolute: 0.8 10*3/uL (ref 0.1–1.0)
Monocytes Relative: 11 %
Neutro Abs: 5.5 10*3/uL (ref 1.7–7.7)
Neutrophils Relative %: 74 %
Platelets: 233 10*3/uL (ref 150–400)
RBC: 3.44 MIL/uL — ABNORMAL LOW (ref 3.87–5.11)
RDW: 19.3 % — ABNORMAL HIGH (ref 11.5–15.5)
WBC: 7.5 10*3/uL (ref 4.0–10.5)
nRBC: 0 % (ref 0.0–0.2)

## 2019-06-05 LAB — COMPREHENSIVE METABOLIC PANEL
ALT: 9 U/L (ref 0–44)
AST: 14 U/L — ABNORMAL LOW (ref 15–41)
Albumin: 3 g/dL — ABNORMAL LOW (ref 3.5–5.0)
Alkaline Phosphatase: 68 U/L (ref 38–126)
Anion gap: 7 (ref 5–15)
BUN: 16 mg/dL (ref 8–23)
CO2: 29 mmol/L (ref 22–32)
Calcium: 8.6 mg/dL — ABNORMAL LOW (ref 8.9–10.3)
Chloride: 97 mmol/L — ABNORMAL LOW (ref 98–111)
Creatinine, Ser: 1.03 mg/dL — ABNORMAL HIGH (ref 0.44–1.00)
GFR calc Af Amer: 58 mL/min — ABNORMAL LOW (ref >60)
GFR calc non Af Amer: 50 mL/min — ABNORMAL LOW (ref >60)
Glucose, Bld: 100 mg/dL — ABNORMAL HIGH (ref 70–99)
Potassium: 4.2 mmol/L (ref 3.5–5.1)
Sodium: 133 mmol/L — ABNORMAL LOW (ref 135–145)
Total Bilirubin: 0.4 mg/dL (ref 0.3–1.2)
Total Protein: 6.4 g/dL — ABNORMAL LOW (ref 6.5–8.1)

## 2019-06-05 LAB — SAMPLE TO BLOOD BANK

## 2019-06-05 MED ORDER — VALACYCLOVIR HCL 1 G PO TABS: 1000.0000 mg | ORAL_TABLET | Freq: Three times a day (TID) | ORAL | 0 refills | Status: DC

## 2019-06-05 MED ORDER — PREDNISONE 5 MG PO TABS: ORAL_TABLET | ORAL | 0 refills | Status: DC

## 2019-06-05 MED ORDER — GABAPENTIN 300 MG PO CAPS: 300.0000 mg | ORAL_CAPSULE | Freq: Every day | ORAL | 0 refills | Status: DC

## 2019-06-05 MED ORDER — SODIUM CHLORIDE 0.9% FLUSH
10.0000 mL | Freq: Once | INTRAVENOUS | Status: AC
  Administered 2019-06-05: 11:00:00 10 mL
  Filled 2019-06-05: qty 10

## 2019-06-05 MED ORDER — HEPARIN SOD (PORK) LOCK FLUSH 100 UNIT/ML IV SOLN
250.0000 [IU] | Freq: Once | INTRAVENOUS | Status: AC
  Administered 2019-06-05: 250 [IU]
  Filled 2019-06-05: qty 5

## 2019-06-05 MED ORDER — PREDNISONE 50 MG PO TABS
ORAL_TABLET | ORAL | Status: AC
  Filled 2019-06-05: qty 1

## 2019-06-05 MED ORDER — PREDNISONE 50 MG PO TABS
50.0000 mg | ORAL_TABLET | Freq: Once | ORAL | Status: AC
  Administered 2019-06-05: 50 mg via ORAL

## 2019-06-05 NOTE — Progress Notes (Signed)
[facility] Progress Note   [name] [id number] [birth year] 85 y.o.  [name] is managed by Dr. Alvy Bimler   Actively treated with chemotherapy/immunotherapy/hormonal therapy: yes  Current therapy: Paclitaxel  Last treated: 05/22/2019 (cycle 6, day 1)   Next scheduled appointment with provider: 06/19/2019  Assessment: Plan:    Herpes zoster without complication - Plan: predniSONE (DELTASONE) tablet 50 mg, gabapentin (NEURONTIN) 300 MG capsule, valACYclovir (VALTREX) 1000 MG tablet, predniSONE (DELTASONE) 5 MG tablet  Left ovarian epithelial cancer ([place]) - Plan: heparin lock flush 100 unit/mL, sodium chloride flush (NS) 0.9 % injection 10 mL  Anemia, chronic disease   Herpes zoster: The patient was given prednisone 50 mg p.o. x1 and was given a prednisone taper which she will start tomorrow.  Additionally she was given Neurontin 300 mg p.o. nightly x3 weeks and was given Valtrex 1000 mg p.o. 3 times daily x7 days.  Ovarian cancer: The patient was received cycle 7, day 1 of paclitaxel today.  This will be held today and rescheduled for 1 week.  Anemia: A CBC returned today with a hemoglobin of 8.6.  The patient does not require a transfusion today.  Please see After Visit Summary for patient specific instructions.  Future Appointments  Date Time Provider [place]  06/19/2019 10:00 AM [facility] LAB 2 [facility] None  06/19/2019 10:15 AM [facility] [place] FLUSH [facility] None  06/19/2019 10:45 AM Heath Lark, MD [facility] None  06/19/2019 11:30 AM [facility] INFUSION [facility] None  07/13/2019 10:40 AM Satira Sark, MD [facility] [place] [facility]    No orders of the defined types were placed in this encounter.      Subjective:   Patient ID:  [name] is a 85 y.o. (DOB 13-May-1933) female.  Chief Complaint:  Chief Complaint  Patient presents with  . 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 Heath Lark and is status post cycle 6, day 1 of paclitaxel.  She was to receive cycle 7, day 1 of paclitaxel today however she presents with a raised, erythematous, tender, burning, and itching rash and her right shoulder blade with extension to her right lateral chest.  She has a history of shingles.  She believes that she could have shingles again.  She has had this rash for 2 to 3 days.  Medications: I have reviewed the patient's current medications.  Allergies:  Allergies  Allergen Reactions  . Codeine Itching    Past Medical History:  Diagnosis Date  . Anxiety   . Chronic blood loss anemia    03-04-2018 diverticular bleed and rectal bleeding,  transfused 2 units PRBCs 03-08-2018  . Colitis   . COPD (chronic obstructive pulmonary disease) (HCC)    Dr. Lamonte Sakai  . Depression   . Diastolic CHF, chronic ([place])   . Diverticulosis   . Family history of colon cancer   . Fibromyalgia   . Genetic testing 04/07/2018   MyRisk (35 genes) @ [facility] - No pathogenic mutations detected  . GERD (gastroesophageal reflux disease)   . Hemorrhoids   . Hiatal hernia   . Hip pain 07/2018   Right Hip Pain  . History of rectal polyps   . History of shingles   . Hypothyroidism   . IBS (irritable bowel syndrome)   . Lymphocytic colitis    Dr. Henrene Pastor  .  Malignant ascites    Admission 06/2017 abdominal s/p parencentesis 07-01-2017 2.5L, 07-08-2016  2.7L, 07-12-2017  1440m  . Neuropathy due to chemotherapeutic drug ([facility]   . On home O2   . Osteoporosis   . Ovarian cancer ([facility]    Chemotherapy - Dr. GAlvy Bimler . Paroxysmal atrial fibrillation ([place]    Xarelto stopped 03-07-2018 due to lower GI bleed  . Pleural effusion    s/p  right thoracentesis, 02-2018 1.3L and 03-17-2018 right thoracentesis 6490m, post cxr no residual effusion  . Psoriatic arthritis ([facility]  . Schatzki's ring 2013  . Seasonal allergic  rhinitis     Past Surgical History:  Procedure Laterality Date  . CARDIOVASCULAR STRESS TEST  09/23/2012   Low risk lexiscan nuclear study w/ apical thinning but no evidence of ischemia/  normal LV function and wall motion , ef 75%  . CATARACT EXTRACTION W/ INTRAOCULAR LENS  IMPLANT, BILATERAL  10/2016  . CHEST TUBE INSERTION Right 06/24/2018   Procedure: INSERTION PLEURAL DRAINAGE CATHETER;  Surgeon: GeGrace IsaacMD;  [facility] Service: Thoracic;  Laterality: Right;  . COLONOSCOPY    . DEBULKING N/A 03/20/2018   Procedure: DEBULKING;  Surgeon: PhIsabel CapriceMD;  Location: [facility];  Service: Gynecology;  Laterality: N/A;  . EXAM UNDER ANESTHESIA WITH MANIPULATION OF KNEE Left 12-20-2003  dr waNoemi Chapel post TKA  . FEMUR IM NAIL Left 12/11/2013   Procedure: INTRAMEDULLARY (IM) NAIL FEMORAL;  Surgeon: FrGearlean AlfMD;  [facility];  Service: Orthopedics;  Laterality: Left;  . HYSTERECTOMY ABDOMINAL WITH SALPINGO-OOPHORECTOMY Bilateral 03/20/2018   Procedure: TOTAL HYSTERECTOMY ABDOMINAL WITH BILATERAL  SALPINGO-OOPHORECTOMY;  Surgeon: PhIsabel CapriceMD;  [facility];  Service: Gynecology;  Laterality: Bilateral;  . IR FLUORO GUIDE PORT INSERTION RIGHT  07/22/2017  . IR PARACENTESIS  07/12/2017  . IR THORACENTESIS ASP PLEURAL SPACE W/IMG GUIDE  03/17/2018  . IR THORACENTESIS ASP PLEURAL SPACE W/IMG GUIDE  05/08/2018  . IR [place] VASC ACCESS RIGHT  07/22/2017  . KNEE ARTHROSCOPY W/ LATERAL RELEASE Left 09-03-2005   [facility] w/  Lysis Adhesions,  excision loose body's  . LAPAROSCOPIC CHOLECYSTECTOMY  12-04-2010  dr zeigler  . LAPAROTOMY N/A 03/20/2018   Procedure: EXPLORATORY LAPAROTOMY;  Surgeon: PhIsabel CapriceMD;  [facility];  Service: Gynecology;  Laterality: N/A;  . OMENTECTOMY N/A 03/20/2018   Procedure: OMENTECTOMY;  Surgeon: PhIsabel CapriceMD;  [facility];  Service: Gynecology;  Laterality: N/A;  . OTHER SURGICAL HISTORY  06/24/2018    Plurex Catheter inserted into the lungs  . TOTAL KNEE ARTHROPLASTY Left 09-08-2003   [facility]. TOTAL KNEE REVISION  08/06/2012   Procedure: TOTAL KNEE REVISION;  Surgeon: FrGearlean AlfMD;  [facility];  Service: Orthopedics;  Laterality: Left;  Left Total Knee Arthroplasty Revision  . TRANSTHORACIC ECHOCARDIOGRAM  08/20/2017   ef 60-65%,  grade 1 diastolic dysfunction/  trivial AR and TR  . Uterine polypectomy      Family History  Problem Relation Age of Onset  . Heart disease Father        pacemaker  . Lung cancer Father        smoker; deceased 9039. Colon cancer Mother 654     2nd rectal ca at 7877. Colon cancer Maternal Grandfather        dx [id number] 7067s.  Depression Sister   . Leukemia Maternal Aunt        deceased 62    Social History   Socioeconomic History  . Marital status: Divorced    Spouse name: Not on file  . Number of children: 1  . Years of education: 64  . Highest education level: GED or equivalent  Occupational History  . Occupation: retired    Fish farm manager: RETIRED  Social Needs  . Financial resource strain: Not hard at all  . Food insecurity    Worry: Never true    Inability: Never true  . Transportation needs    Medical: No    Non-medical: No  Tobacco Use  . Smoking status: Former Smoker    Packs/day: 1.00    Years: 20.00    Pack years: 20.00    Types: Cigarettes    Quit date: 10/08/1980    Years since quitting: 38.6  . Smokeless tobacco: Never Used  Substance and Sexual Activity  . Alcohol use: Not Currently    Comment: rarely, 12-23-15 rarely  . Drug use: No  . Sexual activity: Not Currently  Lifestyle  . Physical activity    Days per week: 0 days    Minutes per session: 0 min  . Stress: Not at all  Relationships  . Social connections    Talks on phone: More than three times a week    Gets together: Three times a week    Attends religious service: More than 4 times per year    Active member of club or  organization: No    Attends meetings of clubs or organizations: Never    Relationship status: Divorced  . Intimate partner violence    Fear of current or ex partner: No    Emotionally abused: No    Physically abused: No    Forced sexual activity: No  Other Topics Concern  . Not on file  Social History Narrative   Patient lives at home by herself.    Patient is retired.    Patient has 12 th grade education.           Past Medical History, Surgical history, Social history, and Family history were reviewed and updated as appropriate.   Please see review of systems for further details on the patient's review from today.   Review of Systems:  Review of Systems  Constitutional: Negative for chills, diaphoresis and fever.  HENT: Negative for facial swelling and trouble swallowing.   Respiratory: Negative for cough, chest tightness and shortness of breath.   Cardiovascular: Negative for chest pain.  Skin: Positive for rash.    Objective:   Physical Exam:  BP 128/64 (BP Location: Left Arm, Patient Position: Sitting)   Pulse 86   Temp 98 F (36.7 C) (Oral)   Resp 18   SpO2 100% Comment: 2L [place] at baseline ECOG: 1  Physical Exam Constitutional:      General: She is not in acute distress.    Appearance: Normal appearance. She is not ill-appearing or diaphoretic.  HENT:     Head: Normocephalic and atraumatic.  Skin:    General: Skin is warm and dry.     Findings: Erythema and rash present.     Comments: There is a large area of vesicles in the right posterior superior back over the scapula with a surrounding area of erythema.  No exudate is noted.  Neurological:     Mental Status: She is alert.     Coordination: Coordination normal.  Gait: Gait abnormal (The patient is ambulating with the use of a wheelchair.).  Psychiatric:        Mood and Affect: Mood normal.        Behavior: Behavior normal.        Thought Content: Thought content normal.        Judgment: Judgment  normal.     Lab Review:     Component Value Date/Time   NA 133 (L) 06/05/2019 1127   NA 135 (L) 09/24/2017 0945   K 4.2 06/05/2019 1127   K 3.7 09/24/2017 0945   CL 97 (L) 06/05/2019 1127   CO2 29 06/05/2019 1127   CO2 30 (H) 09/24/2017 0945   GLUCOSE 100 (H) 06/05/2019 1127   GLUCOSE 101 09/24/2017 0945   BUN 16 06/05/2019 1127   BUN 15.3 09/24/2017 0945   CREATININE 1.03 (H) 06/05/2019 1127   CREATININE 1.11 (H) 05/19/2019 1040   CREATININE 0.8 09/24/2017 0945   CALCIUM 8.6 (L) 06/05/2019 1127   CALCIUM 9.1 09/24/2017 0945   PROT 6.4 (L) 06/05/2019 1127   PROT 6.6 09/24/2017 0945   ALBUMIN 3.0 (L) 06/05/2019 1127   ALBUMIN 3.3 (L) 09/24/2017 0945   AST 14 (L) 06/05/2019 1127   AST 16 05/19/2019 1040   AST 16 09/24/2017 0945   ALT 9 06/05/2019 1127   ALT 11 05/19/2019 1040   ALT 12 09/24/2017 0945   ALKPHOS 68 06/05/2019 1127   ALKPHOS 52 09/24/2017 0945   BILITOT 0.4 06/05/2019 1127   BILITOT 0.4 05/19/2019 1040   BILITOT 1.13 09/24/2017 0945   GFRNONAA 50 (L) 06/05/2019 1127   GFRNONAA 46 (L) 05/19/2019 1040   GFRAA 58 (L) 06/05/2019 1127   GFRAA 53 (L) 05/19/2019 1040       Component Value Date/Time   WBC 7.5 06/05/2019 1127   RBC 3.44 (L) 06/05/2019 1127   HGB 8.6 (L) 06/05/2019 1127   HGB 10.1 (L) 03/13/2018 0940   HGB 10.8 (L) 09/24/2017 0945   HCT 28.1 (L) 06/05/2019 1127   HCT 32.8 (L) 09/24/2017 0945   PLT 233 06/05/2019 1127   PLT 163 03/13/2018 0940   PLT 174 09/24/2017 0945   MCV 81.7 06/05/2019 1127   MCV 93.2 09/24/2017 0945   MCH 25.0 (L) 06/05/2019 1127   MCHC 30.6 06/05/2019 1127   RDW 19.3 (H) 06/05/2019 1127   RDW 18.7 (H) 09/24/2017 0945   LYMPHSABS 0.9 06/05/2019 1127   LYMPHSABS 0.9 09/24/2017 0945   MONOABS 0.8 06/05/2019 1127   MONOABS 0.1 09/24/2017 0945   EOSABS 0.1 06/05/2019 1127   EOSABS 0.1 09/24/2017 0945   BASOSABS 0.0 06/05/2019 1127   BASOSABS 0.0 09/24/2017 0945   -------------------------------  Imaging from  last 24 hours (if applicable):  Radiology interpretation: Dg Chest 2 View  Result Date: 05/19/2019 CLINICAL DATA:  History of ovarian cancer. Shortness of breath. History of atrial fibrillation. EXAM: CHEST - 2 VIEW COMPARISON:  Chest x-rays dated 04/26/2019, 03/30/2019 in 12/17/2017. FINDINGS: Heart size and mediastinal contours are stable. RIGHT chest wall Port-A-Cath is stable in position with tip at the level of the lower SVC. Lungs are hyperexpanded suggesting COPD. Chronic bibasilar opacities, likely atelectasis and/or small pleural effusions. No new lung findings. No pneumothorax seen. No acute or suspicious osseous finding. IMPRESSION: 1. No active cardiopulmonary disease. No evidence of pneumonia or pulmonary edema. 2. Hyperexpanded lungs suggesting COPD. 3. Chronic bibasilar opacities, likely mild atelectasis and/or small pleural effusions. Electronically Signed   By: Cherlynn Kaiser  Enriqueta Shutter M.D.   On: 05/19/2019 12:07   Ct Chest W Contrast  Result Date: 05/29/2019 CLINICAL DATA:  Restaging epithelial ovarian/fallopian tube/primary peritoneal carcinoma. EXAM: CT CHEST, ABDOMEN, AND PELVIS WITH CONTRAST TECHNIQUE: Multidetector CT imaging of the chest, abdomen and pelvis was performed following the standard protocol during bolus administration of intravenous contrast. CONTRAST:  125m OMNIPAQUE IOHEXOL 300 MG/ML  SOLN COMPARISON:  CT chest 04/26/2019 and CT abdomen pelvis 02/09/2019. FINDINGS: CT CHEST FINDINGS Cardiovascular: The heart size is normal. Aortic atherosclerosis. Three vessel coronary artery calcifications. No pericardial effusion. Mediastinum/Nodes: No enlarged mediastinal, hilar, or axillary lymph nodes. Thyroid gland, trachea, and esophagus demonstrate no significant findings. Lungs/Pleura: Trace left pleural effusion and small to moderate right pleural effusion are unchanged from 04/26/2019. Multiple small, less than 5 mm pulmonary nodules are identified within the right upper lobe. These  are similar to the previous exam. Index nodule in the right upper lobe measures 3 mm, image 31/6. Musculoskeletal: Spondylosis identified within the thoracic spine. No aggressive lytic or sclerotic bone lesions identified. CT ABDOMEN PELVIS FINDINGS Hepatobiliary: No focal liver abnormality. Previous cholecystectomy. There is chronic increase caliber of the common bile duct which measures up to 11 mm. Pancreas: Unremarkable. There is a cystic lesion within the neck of pancreas which measures up to 1.7 cm, image 57/4. Previously this measured up to 1.3 cm. Spleen: There are scattered low attenuation foci within the spleen. Unchanged from previous exam. Adrenals/Urinary Tract: Normal appearance of the adrenal glands. No kidney mass or hydronephrosis. The urinary bladder appears normal. Stomach/Bowel: Dilatation of the small and large bowel loops are again identified. The small bowel loops measure up to 2.8 cm, similar to the previous exam. The distal small bowel loops are decreased in caliber suggesting the low-grade partial obstruction as mentioned previously. Vascular/Lymphatic: Aortic atherosclerosis. No abdominopelvic adenopathy. Reproductive: Previous hysterectomy. Decreased previously noted small volume ascites. No fluid collections identified. Mild peritoneal thickening in the pelvis and along the pericolic gutters noted, unchanged. Other: Ventral abdominal wall hernia containing nonobstructed loop of transverse colon. Unchanged from previous exam. Musculoskeletal: No acute or significant osseous findings. Previous ORIF of the left femur. Multi level spondylosis identified within the lumbar spine. No aggressive lytic or sclerotic bone lesions. IMPRESSION: 1. Interval resolution of small volume ascites noted within the pelvis. Continued mild peritoneal thickening consistent with peritoneal carcinomatosis. 2. No new or progressive findings identified within the chest, abdomen or pelvis. 3. Partial small bowel  obstruction described on 02/09/2019 is similar to previous exam. 4. Unchanged appearance of small pleural effusions, right greater than left. 5. Cystic lesion within neck of pancreas is identified, indeterminate measuring 1.7 cm. Increased from 1.3 cm previously. According to consensus criteria follow-up imaging in 6 months with repeat contrast enhanced pancreas protocol MRI or CT is advised. This recommendation follows ACR consensus guidelines: Management of Incidental Pancreatic Cysts: A White Paper of the ACR Incidental Findings Committee. J Am Coll Radiol 26734;19:379-024 6. Tiny small nonspecific nodules within the right upper lobe are unchanged from previous exam. Electronically Signed   By: TKerby MoorsM.D.   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 EXAM: CT CHEST, ABDOMEN, AND PELVIS WITH CONTRAST TECHNIQUE: Multidetector CT imaging of the chest, abdomen and pelvis was performed following the standard protocol during bolus administration of intravenous contrast. CONTRAST:  1040mOMNIPAQUE IOHEXOL 300 MG/ML  SOLN COMPARISON:  CT chest 04/26/2019 and CT abdomen  pelvis 02/09/2019. FINDINGS: CT CHEST FINDINGS Cardiovascular: The heart size is normal. Aortic atherosclerosis. Three vessel coronary artery calcifications. No pericardial effusion. Mediastinum/Nodes: No enlarged mediastinal, hilar, or axillary lymph nodes. Thyroid gland, trachea, and esophagus demonstrate no significant findings. Lungs/Pleura: Trace left pleural effusion and small to moderate right pleural effusion are unchanged from 04/26/2019. Multiple small, less than 5 mm pulmonary nodules are identified within the right upper lobe. These are similar to the previous exam. Index nodule in the right upper lobe measures 3 mm, image 31/6. Musculoskeletal: Spondylosis identified within the thoracic spine. No aggressive  lytic or sclerotic bone lesions identified. CT ABDOMEN PELVIS FINDINGS Hepatobiliary: No focal liver abnormality. Previous cholecystectomy. There is chronic increase caliber of the common bile duct which measures up to 11 mm. Pancreas: Unremarkable. There is a cystic lesion within the neck of pancreas which measures up to 1.7 cm, image 57/4. Previously this measured up to 1.3 cm. Spleen: There are scattered low attenuation foci within the spleen. Unchanged from previous exam. Adrenals/Urinary Tract: Normal appearance of the adrenal glands. No kidney mass or hydronephrosis. The urinary bladder appears normal. Stomach/Bowel: Dilatation of the small and large bowel loops are again identified. The small bowel loops measure up to 2.8 cm, similar to the previous exam. The distal small bowel loops are decreased in caliber suggesting the low-grade partial obstruction as mentioned previously. Vascular/Lymphatic: Aortic atherosclerosis. No abdominopelvic adenopathy. Reproductive: Previous hysterectomy. Decreased previously noted small volume ascites. No fluid collections identified. Mild peritoneal thickening in the pelvis and along the pericolic gutters noted, unchanged. Other: Ventral abdominal wall hernia containing nonobstructed loop of transverse colon. Unchanged from previous exam. Musculoskeletal: No acute or significant osseous findings. Previous ORIF of the left femur. Multi level spondylosis identified within the lumbar spine. No aggressive lytic or sclerotic bone lesions. IMPRESSION: 1. Interval resolution of small volume ascites noted within the pelvis. Continued mild peritoneal thickening consistent with peritoneal carcinomatosis. 2. No new or progressive findings identified within the chest, abdomen or pelvis. 3. Partial small bowel obstruction described on 02/09/2019 is similar to previous exam. 4. Unchanged appearance of small pleural effusions, right greater than left. 5. Cystic lesion within neck of pancreas  is identified, indeterminate measuring 1.7 cm. Increased from 1.3 cm previously. According to consensus criteria follow-up imaging in 6 months with repeat contrast enhanced pancreas protocol MRI or CT is advised. This recommendation follows ACR consensus guidelines: Management of Incidental Pancreatic Cysts: A White Paper of the ACR Incidental Findings Committee. J Am Coll Radiol 8563;14:970-263. 6. Tiny small nonspecific nodules within the right upper lobe are unchanged from previous exam. Electronically Signed   By: Kerby Moors M.D.   On: 05/29/2019 15:13

## 2019-06-05 NOTE — Progress Notes (Signed)
Per Dr. Alvy Bimler, no treatment today. Blood only if needed and reschedule appointments to next Friday.

## 2019-06-05 NOTE — Telephone Encounter (Signed)
"  Charee Vasseur Zebrowski daughter [name] [phone]).  Mom is scheduled today but I think mom may have shingles.  Burning pain started a few days ago around right shoulder blade around to underarm.  I saw blisters and rash yesterday when she asked me to look at it.  Does she come there and see someone or what?"

## 2019-06-05 NOTE — Patient Instructions (Signed)
Shingles  Shingles is an infection. It gives you a painful skin rash and blisters that have fluid in them. Shingles is caused by the same germ (virus) that causes chickenpox. Shingles only happens in people who:  Have had chickenpox.  Have been given a shot of medicine (vaccine) to protect against chickenpox. Shingles is rare in this group. The first symptoms of shingles may be itching, tingling, or pain in an area on your skin. A rash will show on your skin a few days or weeks later. The rash is likely to be on one side of your body. The rash usually has a shape like a belt or a band. Over time, the rash turns into fluid-filled blisters. The blisters will break open, change into scabs, and dry up. Medicines may:  Help with pain and itching.  Help you get better sooner.  Help to prevent long-term problems. Follow these instructions at home: Medicines  Take over-the-counter and prescription medicines only as told by your doctor.  Put on an anti-itch cream or numbing cream where you have a rash, blisters, or scabs. Do this as told by your doctor. Helping with itching and discomfort   Put cold, wet cloths (cold compresses) on the area of the rash or blisters as told by your doctor.  Cool baths can help you feel better. Try adding baking soda or dry oatmeal to the water to lessen itching. Do not bathe in hot water. Blister and rash care  Keep your rash covered with a loose bandage (dressing).  Wear loose clothing that does not rub on your rash.  Keep your rash and blisters clean. To do this, wash the area with mild soap and cool water as told by your doctor.  Check your rash every day for signs of infection. Check for: ? More redness, swelling, or pain. ? Fluid or blood. ? Warmth. ? Pus or a bad smell.  Do not scratch your rash. Do not pick at your blisters. To help you to not scratch: ? Keep your fingernails clean and cut short. ? Wear gloves or mittens when you sleep, if  scratching is a problem. General instructions  Rest as told by your doctor.  Keep all follow-up visits as told by your doctor. This is important.  Wash your hands often with soap and water. If soap and water are not available, use hand sanitizer. Doing this lowers your chance of getting a skin infection caused by germs (bacteria).  Your infection can cause chickenpox in people who have never had chickenpox or never got a shot of chickenpox vaccine. If you have blisters that did not change into scabs yet, try not to touch other people or be around other people, especially: ? Babies. ? Pregnant women. ? Children who have areas of red, itchy, or rough skin (eczema). ? Very old people who have transplants. ? People who have a long-term (chronic) sickness, like cancer or AIDS. Contact a doctor if:  Your pain does not get better with medicine.  Your pain does not get better after the rash heals.  You have any signs of infection in the rash area. These signs include: ? More redness, swelling, or pain around the rash. ? Fluid or blood coming from the rash. ? The rash area feeling warm to the touch. ? Pus or a bad smell coming from the rash. Get help right away if:  The rash is on your face or nose.  You have pain in your face or pain by   your eye.  You lose feeling on one side of your face.  You have trouble seeing.  You have ear pain, or you have ringing in your ear.  You have a loss of taste.  Your condition gets worse. Summary  Shingles gives you a painful skin rash and blisters that have fluid in them.  Shingles is an infection. It is caused by the same germ (virus) that causes chickenpox.  Keep your rash covered with a loose bandage (dressing). Wear loose clothing that does not rub on your rash.  If you have blisters that did not change into scabs yet, try not to touch other people or be around people. This information is not intended to replace advice given to you by  your health care provider. Make sure you discuss any questions you have with your health care provider. Document Released: 03/12/2008 Document Revised: 01/16/2019 Document Reviewed: 05/29/2017 Elsevier Patient Education  2020 [employer]  

## 2019-06-05 NOTE — Progress Notes (Signed)
Pt not receiving blood today d/t Hgb 8.6.  Pt/daughter aware and verbalized understanding that they will be contacted for f/u appts next week.  Pt presents to [facility] with splotchy scaly raised red rash on R scapula.  Hx shingles per pt who states that it feels the same as the last time she had shingles.  Burning, tender, and sore.  Denies changes in breathing or swallowing.  No drainage or bleeding or open sores noted.  Afebrile.  Pt placed on isolation precautions per PA Lucianne Lei for shingles.

## 2019-06-05 NOTE — Telephone Encounter (Signed)
[name] called and left a message to call her.   Called [name] back. Her Mother is complaining of burning sensation/pain and has red small blisters to right shoulder. Instructed to come in today for appts and she will see Sandi Mealy, PA today.

## 2019-06-05 NOTE — Telephone Encounter (Signed)
No los per 8/28.

## 2019-06-07 ENCOUNTER — Other Ambulatory Visit: Payer: Self-pay | Admitting: Emergency Medicine

## 2019-06-09 ENCOUNTER — Telehealth: Payer: Self-pay | Admitting: Hematology and Oncology

## 2019-06-09 ENCOUNTER — Ambulatory Visit: Payer: Medicare Other | Admitting: Cardiology

## 2019-06-09 NOTE — Telephone Encounter (Signed)
Scheduled appt per 8/28 sch message - spoke with Daughter [name] and she is aware of appts date and time

## 2019-06-12 ENCOUNTER — Other Ambulatory Visit: Payer: Self-pay

## 2019-06-12 ENCOUNTER — Inpatient Hospital Stay: Payer: Medicare Other

## 2019-06-12 ENCOUNTER — Inpatient Hospital Stay: Payer: Medicare Other | Attending: Hematology and Oncology

## 2019-06-12 VITALS — BP 143/54 | HR 77 | Temp 98.7°F | Resp 18

## 2019-06-12 DIAGNOSIS — C561 Malignant neoplasm of right ovary: Secondary | ICD-10-CM | POA: Insufficient documentation

## 2019-06-12 DIAGNOSIS — I480 Paroxysmal atrial fibrillation: Secondary | ICD-10-CM | POA: Insufficient documentation

## 2019-06-12 DIAGNOSIS — M797 Fibromyalgia: Secondary | ICD-10-CM | POA: Diagnosis not present

## 2019-06-12 DIAGNOSIS — E039 Hypothyroidism, unspecified: Secondary | ICD-10-CM | POA: Insufficient documentation

## 2019-06-12 DIAGNOSIS — I700 Atherosclerosis of aorta: Secondary | ICD-10-CM | POA: Insufficient documentation

## 2019-06-12 DIAGNOSIS — L308 Other specified dermatitis: Secondary | ICD-10-CM | POA: Insufficient documentation

## 2019-06-12 DIAGNOSIS — I5032 Chronic diastolic (congestive) heart failure: Secondary | ICD-10-CM | POA: Insufficient documentation

## 2019-06-12 DIAGNOSIS — J449 Chronic obstructive pulmonary disease, unspecified: Secondary | ICD-10-CM | POA: Insufficient documentation

## 2019-06-12 DIAGNOSIS — K219 Gastro-esophageal reflux disease without esophagitis: Secondary | ICD-10-CM | POA: Diagnosis not present

## 2019-06-12 DIAGNOSIS — Z87891 Personal history of nicotine dependence: Secondary | ICD-10-CM | POA: Diagnosis not present

## 2019-06-12 DIAGNOSIS — L299 Pruritus, unspecified: Secondary | ICD-10-CM | POA: Diagnosis not present

## 2019-06-12 DIAGNOSIS — Z8719 Personal history of other diseases of the digestive system: Secondary | ICD-10-CM | POA: Insufficient documentation

## 2019-06-12 DIAGNOSIS — D638 Anemia in other chronic diseases classified elsewhere: Secondary | ICD-10-CM

## 2019-06-12 DIAGNOSIS — D630 Anemia in neoplastic disease: Secondary | ICD-10-CM | POA: Diagnosis not present

## 2019-06-12 DIAGNOSIS — C562 Malignant neoplasm of left ovary: Secondary | ICD-10-CM

## 2019-06-12 DIAGNOSIS — Z5111 Encounter for antineoplastic chemotherapy: Secondary | ICD-10-CM | POA: Diagnosis present

## 2019-06-12 DIAGNOSIS — F419 Anxiety disorder, unspecified: Secondary | ICD-10-CM | POA: Diagnosis not present

## 2019-06-12 LAB — CBC WITH DIFFERENTIAL/PLATELET
Abs Immature Granulocytes: 0.38 10*3/uL — ABNORMAL HIGH (ref 0.00–0.07)
Basophils Absolute: 0 10*3/uL (ref 0.0–0.1)
Basophils Relative: 0 %
Eosinophils Absolute: 0.1 10*3/uL (ref 0.0–0.5)
Eosinophils Relative: 1 %
HCT: 30 % — ABNORMAL LOW (ref 36.0–46.0)
Hemoglobin: 9.1 g/dL — ABNORMAL LOW (ref 12.0–15.0)
Immature Granulocytes: 3 %
Lymphocytes Relative: 14 %
Lymphs Abs: 1.7 10*3/uL (ref 0.7–4.0)
MCH: 24.5 pg — ABNORMAL LOW (ref 26.0–34.0)
MCHC: 30.3 g/dL (ref 30.0–36.0)
MCV: 80.6 fL (ref 80.0–100.0)
Monocytes Absolute: 1.6 10*3/uL — ABNORMAL HIGH (ref 0.1–1.0)
Monocytes Relative: 13 %
Neutro Abs: 8.5 10*3/uL — ABNORMAL HIGH (ref 1.7–7.7)
Neutrophils Relative %: 69 %
Platelets: 282 10*3/uL (ref 150–400)
RBC: 3.72 MIL/uL — ABNORMAL LOW (ref 3.87–5.11)
RDW: 19.2 % — ABNORMAL HIGH (ref 11.5–15.5)
WBC: 12.3 10*3/uL — ABNORMAL HIGH (ref 4.0–10.5)
nRBC: 0.3 % — ABNORMAL HIGH (ref 0.0–0.2)

## 2019-06-12 LAB — COMPREHENSIVE METABOLIC PANEL
ALT: 16 U/L (ref 0–44)
AST: 15 U/L (ref 15–41)
Albumin: 3.4 g/dL — ABNORMAL LOW (ref 3.5–5.0)
Alkaline Phosphatase: 65 U/L (ref 38–126)
Anion gap: 8 (ref 5–15)
BUN: 19 mg/dL (ref 8–23)
CO2: 29 mmol/L (ref 22–32)
Calcium: 8.7 mg/dL — ABNORMAL LOW (ref 8.9–10.3)
Chloride: 97 mmol/L — ABNORMAL LOW (ref 98–111)
Creatinine, Ser: 0.92 mg/dL (ref 0.44–1.00)
GFR calc Af Amer: >60 mL/min (ref >60)
GFR calc non Af Amer: 57 mL/min — ABNORMAL LOW (ref >60)
Glucose, Bld: 103 mg/dL — ABNORMAL HIGH (ref 70–99)
Potassium: 4.1 mmol/L (ref 3.5–5.1)
Sodium: 134 mmol/L — ABNORMAL LOW (ref 135–145)
Total Bilirubin: 0.3 mg/dL (ref 0.3–1.2)
Total Protein: 6.9 g/dL (ref 6.5–8.1)

## 2019-06-12 LAB — SAMPLE TO BLOOD BANK

## 2019-06-12 MED ORDER — FAMOTIDINE IN NACL 20-0.9 MG/50ML-% IV SOLN
20.0000 mg | Freq: Once | INTRAVENOUS | Status: AC
  Administered 2019-06-12: 13:00:00 20 mg via INTRAVENOUS

## 2019-06-12 MED ORDER — DIPHENHYDRAMINE HCL 50 MG/ML IJ SOLN
INTRAMUSCULAR | Status: AC
  Filled 2019-06-12: qty 1

## 2019-06-12 MED ORDER — SODIUM CHLORIDE 0.9% FLUSH
10.0000 mL | Freq: Once | INTRAVENOUS | Status: AC
  Administered 2019-06-12: 11:00:00 10 mL
  Filled 2019-06-12: qty 10

## 2019-06-12 MED ORDER — HEPARIN SOD (PORK) LOCK FLUSH 100 UNIT/ML IV SOLN
500.0000 [IU] | Freq: Once | INTRAVENOUS | Status: AC | PRN
  Administered 2019-06-12: 500 [IU]
  Filled 2019-06-12: qty 5

## 2019-06-12 MED ORDER — SODIUM CHLORIDE 0.9 % IV SOLN
20.0000 mg | Freq: Once | INTRAVENOUS | Status: AC
  Administered 2019-06-12: 13:00:00 20 mg via INTRAVENOUS
  Filled 2019-06-12: qty 20

## 2019-06-12 MED ORDER — SODIUM CHLORIDE 0.9% FLUSH
10.0000 mL | INTRAVENOUS | Status: DC | PRN
  Administered 2019-06-12: 15:00:00 10 mL
  Filled 2019-06-12: qty 10

## 2019-06-12 MED ORDER — SODIUM CHLORIDE 0.9 % IV SOLN
Freq: Once | INTRAVENOUS | Status: AC
  Administered 2019-06-12: 12:00:00 via INTRAVENOUS
  Filled 2019-06-12: qty 250

## 2019-06-12 MED ORDER — SODIUM CHLORIDE 0.9 % IV SOLN
64.0000 mg/m2 | Freq: Once | INTRAVENOUS | Status: AC
  Administered 2019-06-12: 126 mg via INTRAVENOUS
  Filled 2019-06-12: qty 21

## 2019-06-12 MED ORDER — FAMOTIDINE IN NACL 20-0.9 MG/50ML-% IV SOLN
INTRAVENOUS | Status: AC
  Filled 2019-06-12: qty 50

## 2019-06-12 MED ORDER — DIPHENHYDRAMINE HCL 50 MG/ML IJ SOLN
50.0000 mg | Freq: Once | INTRAMUSCULAR | Status: AC
  Administered 2019-06-12: 13:00:00 50 mg via INTRAVENOUS

## 2019-06-12 NOTE — Patient Instructions (Signed)
[facility] Discharge Instructions for Patients Receiving Chemotherapy  Today you received the following chemotherapy agents:  Taxol.  To help prevent nausea and vomiting after your treatment, we encourage you to take your nausea medication as directed.   If you develop nausea and vomiting that is not controlled by your nausea medication, call the clinic.   BELOW ARE SYMPTOMS THAT SHOULD BE REPORTED IMMEDIATELY:  *FEVER GREATER THAN 100.5 F  *CHILLS WITH OR WITHOUT FEVER  NAUSEA AND VOMITING THAT IS NOT CONTROLLED WITH YOUR NAUSEA MEDICATION  *UNUSUAL SHORTNESS OF BREATH  *UNUSUAL BRUISING OR BLEEDING  TENDERNESS IN MOUTH AND THROAT WITH OR WITHOUT PRESENCE OF ULCERS  *URINARY PROBLEMS  *BOWEL PROBLEMS  UNUSUAL RASH Items with * indicate a potential emergency and should be followed up as soon as possible.  Feel free to call the clinic should you have any questions or concerns. The clinic phone number is [phone].  Please show the CHEMO ALERT CARD at check-in to the Emergency Department and triage nurse.   

## 2019-06-13 LAB — CA 125: Cancer Antigen (CA) 125: 148 U/mL — ABNORMAL HIGH (ref 0.0–38.1)

## 2019-06-16 ENCOUNTER — Telehealth: Payer: Self-pay

## 2019-06-16 NOTE — Telephone Encounter (Signed)
-----   Message from Heath Lark, MD sent at 06/16/2019  7:59 AM EDT ----- Regarding: CA-125 is better pls let her know

## 2019-06-16 NOTE — Telephone Encounter (Signed)
Called and given below message to [employer]. She verbalized understanding.

## 2019-06-19 ENCOUNTER — Ambulatory Visit: Payer: Medicare Other | Admitting: Hematology and Oncology

## 2019-06-19 ENCOUNTER — Ambulatory Visit: Payer: Medicare Other

## 2019-06-19 ENCOUNTER — Other Ambulatory Visit: Payer: Medicare Other

## 2019-06-23 ENCOUNTER — Other Ambulatory Visit: Payer: Self-pay | Admitting: *Deleted

## 2019-06-23 ENCOUNTER — Telehealth: Payer: Self-pay | Admitting: *Deleted

## 2019-06-23 ENCOUNTER — Other Ambulatory Visit: Payer: Self-pay | Admitting: Medical

## 2019-06-23 DIAGNOSIS — C562 Malignant neoplasm of left ovary: Secondary | ICD-10-CM

## 2019-06-23 MED ORDER — MORPHINE SULFATE 15 MG PO TABS: 15.0000 mg | ORAL_TABLET | ORAL | 0 refills | Status: DC | PRN

## 2019-06-23 NOTE — Progress Notes (Signed)
[facility] Progress Note   [name] [id number] 27-Nov-1932 85 y.o.  [name] is managed by Dr. Heath Lark  Actively treated with chemotherapy/immunotherapy/hormonal therapy: yes  Current therapy:  Paclitaxel (cycle 7, day 1)  Last treated:  06/12/2019  Next scheduled appointment with provider: 06/26/2019  Assessment: Plan:    Left ovarian epithelial cancer ([place]) - Plan: sodium chloride flush (NS) 0.9 % injection 10 mL, heparin lock flush 100 unit/mL  Anemia, chronic disease - Plan: sodium chloride flush (NS) 0.9 % injection 10 mL, heparin lock flush 100 unit/mL  Symptomatic anemia - Plan: Practitioner attestation of consent, Complete patient signature process for consent form, Type and screen, Care order/instruction, 0.9 %  sodium chloride infusion (Manually program via Guardrails IV Fluids), heparin lock flush 100 unit/mL, Prepare RBC, Transfuse RBC, acetaminophen (TYLENOL) tablet 650 mg, Type and screen, Prepare RBC, Care order/instruction, Complete patient signature process for consent form, Practitioner attestation of consent, Transfuse RBC, Transfuse RBC  Pruritic dermatitis - Plan: predniSONE (DELTASONE) tablet 20 mg, predniSONE (DELTASONE) 10 MG tablet, hydrocortisone valerate cream (WESTCORT) 0.2 %  Herpes zoster without complication - Plan: oxyCODONE-acetaminophen (PERCOCET/ROXICET) 5-325 MG per tablet 1 tablet, predniSONE (DELTASONE) 10 MG tablet, hydrocortisone valerate cream (WESTCORT) 0.2 %  Hypothyroidism, unspecified type - Plan: Thyroid Panel With TSH, Lipid panel   Ovarian cancer: The patient is status post cycle 7, day 1 of paclitaxel.  Her next treatment is scheduled for 06/26/2019.  Her primary care provider asked that a lipid panel and thyroid panel be added to her labs for Friday.  These have been ordered.  Anemia of chronic disease likely compounded by chemotherapy: A CBC returned with a hemoglobin of 7.6 and hematocrit of 25.5.   The patient was transfused with 2 units of packed red blood cells today.  Pruritus secondary to a recent herpes zoster outbreak on the right upper back: Patient was given prednisone 20 mg p.o. x1 and was given a prednisone taper along with topical steroids to use.  Hypothyroidism: A thyroid panel and lipid panel have been ordered for collection on 06/26/2019 at the time of the patient's return.  Please see After Visit Summary for patient specific instructions.  Future Appointments  Date Time Provider [place]  06/26/2019 10:30 AM [facility] LAB 6 [facility] None  06/26/2019 10:45 AM [facility] [place] FLUSH [facility] None  06/26/2019 11:15 AM Heath Lark, MD [facility] None  06/26/2019 12:30 PM [facility] INFUSION [facility] None  07/28/2019  3:20 PM Satira Sark, MD [facility]    Orders Placed This Encounter  Procedures  . Thyroid Panel With TSH  . Lipid panel  . Practitioner attestation of consent  . Complete patient signature process for consent form  . Care order/instruction  . Type and screen  . Prepare RBC       Subjective:   Patient ID:  [name] is a 85 y.o. (DOB 10/15/1932) female.  Chief Complaint:  Chief Complaint  Patient presents with  . 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 Heath Lark and is status post cycle 7, day 1 of paclitaxel.  A call was received by the clinic yesterday stating that the patient had been very weak after her last round of chemotherapy.  She had spent most of the day in bed on Monday.  She denies shortness of  breath, nausea, or vomiting.  She stated that she was eating well but was likely not drinking enough water.  She is scheduled for chemotherapy on Friday however her daughter [name]) wanted the patient to come in before then for lab work and evaluation.  The patient's  daughter reports that the patient has had episodic confusion.  The patient lives alone and has missed some of her evening medications although they have been organized by her daughter.  There is a caregiver that comes to her house 5 days a week for several hours.  The patient continues to have pain and itching at the site of a recent shingles outbreak in the upper right back.  Her energy level is low.  She denies nausea, vomiting, diarrhea, chest pain, shortness of breath, fevers, or chills.  She is on O2 at 2 L/min via nasal cannula.  She asked that she be given a refill of her long-acting pain medication.  A prescription for her short acting pain medication was sent to her pharmacy yesterday.  Medications: I have reviewed the patient's current medications.  Allergies:  Allergies  Allergen Reactions  . Codeine Itching    Past Medical History:  Diagnosis Date  . Anxiety   . Chronic blood loss anemia    03-04-2018 diverticular bleed and rectal bleeding,  transfused 2 units PRBCs 03-08-2018  . Colitis   . COPD (chronic obstructive pulmonary disease) (H[facility])    Dr. Lamonte Sakai  . Depression   . Diastolic CHF, chronic ([place])   . Diverticulosis   . Family history of colon cancer   . Fibromyalgia   . Genetic testing 04/07/2018   MyRisk (35 genes) @ [facility] - No pathogenic mutations detected  . GERD (gastroesophageal reflux disease)   . Hemorrhoids   . Hiatal hernia   . Hip pain 07/2018   Right Hip Pain  . History of rectal polyps   . History of shingles   . Hypothyroidism   . IBS (irritable bowel syndrome)   . Lymphocytic colitis    Dr. Henrene Pastor  . Malignant ascites    Admission 06/2017 abdominal s/p parencentesis 07-01-2017 2.5L, 07-08-2016  2.7L, 07-12-2017  143m  . Neuropathy due to chemotherapeutic drug ([place]   . On home O2   . Osteoporosis   . Ovarian cancer ([facility]    Chemotherapy - Dr. GAlvy Bimler . Paroxysmal atrial fibrillation ([facility]    Xarelto stopped 03-07-2018 due to lower GI bleed   . Pleural effusion    s/p  right thoracentesis, 02-2018 1.3L and 03-17-2018 right thoracentesis 6482m, post cxr no residual effusion  . Psoriatic arthritis ([place]  . Schatzki's ring 2013  . Seasonal allergic rhinitis     Past Surgical History:  Procedure Laterality Date  . CARDIOVASCULAR STRESS TEST  09/23/2012   Low risk lexiscan nuclear study w/ apical thinning but no evidence of ischemia/  normal LV function and wall motion , ef 75%  . CATARACT EXTRACTION W/ INTRAOCULAR LENS  IMPLANT, BILATERAL  10/2016  . CHEST TUBE INSERTION Right 06/24/2018   Procedure: INSERTION PLEURAL DRAINAGE CATHETER;  Surgeon: GeGrace IsaacMD;  [facility] Service: Thoracic;  Laterality: Right;  . COLONOSCOPY    . DEBULKING N/A 03/20/2018   Procedure: DEBULKING;  Surgeon: PhIsabel CapriceMD;  Location: [facility];  Service: Gynecology;  Laterality: N/A;  . EXAM UNDER ANESTHESIA WITH MANIPULATION OF KNEE Left 12-20-2003  dr waNoemi Chapel post [facility]  . FEMUR IM NAIL Left 12/11/2013  Procedure: INTRAMEDULLARY (IM) NAIL FEMORAL;  Surgeon: [name], MD;  Location: [facility];  Service: Orthopedics;  Laterality: Left;  . HYSTERECTOMY ABDOMINAL WITH SALPINGO-OOPHORECTOMY Bilateral 03/20/2018   Procedure: TOTAL HYSTERECTOMY ABDOMINAL WITH BILATERAL  SALPINGO-OOPHORECTOMY;  Surgeon: Isabel Caprice, MD;  [facility];  Service: Gynecology;  Laterality: Bilateral;  . IR FLUORO GUIDE PORT INSERTION RIGHT  07/22/2017  . IR PARACENTESIS  07/12/2017  . IR THORACENTESIS ASP PLEURAL SPACE W/IMG GUIDE  03/17/2018  . IR THORACENTESIS ASP PLEURAL SPACE W/IMG GUIDE  05/08/2018  . IR US GUIDE VASC ACCESS RIGHT  07/22/2017  . KNEE ARTHROSCOPY W/ LATERAL RELEASE Left 09-03-2005   dr Noemi Chapel  [facility]   w/  Lysis Adhesions,  excision loose body's  . LAPAROSCOPIC CHOLECYSTECTOMY  12-04-2010  dr zeigler  . LAPAROTOMY N/A 03/20/2018   Procedure: EXPLORATORY LAPAROTOMY;  Surgeon: Isabel Caprice, MD;  [facility];  Service:  Gynecology;  Laterality: N/A;  . OMENTECTOMY N/A 03/20/2018   Procedure: OMENTECTOMY;  Surgeon: Isabel Caprice, MD;  [facility];  Service: Gynecology;  Laterality: N/A;  . OTHER SURGICAL HISTORY  06/24/2018   Plurex Catheter inserted into the lungs  . TOTAL KNEE ARTHROPLASTY Left 09-08-2003   dr Noemi Chapel  [facility]  . TOTAL KNEE REVISION  08/06/2012   Procedure: TOTAL KNEE REVISION;  Surgeon: [name], MD;  [facility];  Service: Orthopedics;  Laterality: Left;  Left Total Knee Arthroplasty Revision  . TRANSTHORACIC ECHOCARDIOGRAM  08/20/2017   ef 60-65%,  grade 1 diastolic dysfunction/  trivial AR and TR  . Uterine polypectomy      Family History  Problem Relation Age of Onset  . Heart disease Father        pacemaker  . Lung cancer Father        smoker; deceased 9  . Colon cancer Mother 38       2nd rectal ca at 21  . Colon cancer Maternal Grandfather        dx 70s; deceased 80s  . Depression Sister   . Leukemia Maternal Aunt        deceased 4    Social History   Socioeconomic History  . Marital status: Divorced    Spouse name: Not on file  . Number of children: 1  . Years of education: 51  . Highest education level: GED or equivalent  Occupational History  . Occupation: retired    Fish farm manager: RETIRED  Social Needs  . Financial resource strain: Not hard at all  . Food insecurity    Worry: Never true    Inability: Never true  . Transportation needs    Medical: No    Non-medical: No  Tobacco Use  . Smoking status: Former Smoker    Packs/day: 1.00    Years: 20.00    Pack years: 20.00    Types: Cigarettes    Quit date: 10/08/1980    Years since quitting: 38.7  . Smokeless tobacco: Never Used  Substance and Sexual Activity  . Alcohol use: Not Currently    Comment: rarely, 12-23-15 rarely  . Drug use: No  . Sexual activity: Not Currently  Lifestyle  . Physical activity    Days per week: 0 days    Minutes per session: 0 min  . Stress: Not at all   Relationships  . Social connections    Talks on phone: More than three times a week    Gets together: Three times a week  Attends religious service: More than 4 times per year    Active member of club or organization: No    Attends meetings of clubs or organizations: Never    Relationship status: Divorced  . Intimate partner violence    Fear of current or ex partner: No    Emotionally abused: No    Physically abused: No    Forced sexual activity: No  Other Topics Concern  . Not on file  Social History Narrative   Patient lives at home by herself.    Patient is retired.    Patient has 12 th grade education.           Past Medical History, Surgical history, Social history, and Family history were reviewed and updated as appropriate.   Please see review of systems for further details on the patient's review from today.   Review of Systems:  Review of Systems  Constitutional: Positive for fatigue. Negative for appetite change, chills, diaphoresis and fever.  HENT: Negative for dental problem, mouth sores and trouble swallowing.   Respiratory: Negative for cough, chest tightness and shortness of breath.   Cardiovascular: Negative for chest pain and palpitations.  Gastrointestinal: Negative for constipation, diarrhea, nausea and vomiting.  Skin: Positive for rash.       Itching over the right upper back.  Neurological: Positive for weakness. Negative for dizziness, syncope and headaches.    Objective:   Physical Exam:  BP (!) 119/54   Pulse 66   Temp 98.1 F (36.7 C) (Oral)   Resp 18   SpO2 97%  ECOG: 1  Physical Exam Constitutional:      General: She is not in acute distress.    Appearance: She is not diaphoretic.  HENT:     Head: Normocephalic and atraumatic.  Eyes:     General: No scleral icterus.       Right eye: No discharge.        Left eye: No discharge.     Comments: Conjunctiva is pale  Cardiovascular:     Rate and Rhythm: Normal rate and regular  rhythm.     Heart sounds: Normal heart sounds. No murmur. No friction rub. No gallop.   Pulmonary:     Effort: Pulmonary effort is normal. No respiratory distress.     Breath sounds: Normal breath sounds. No wheezing or rales.  Skin:    General: Skin is warm and dry.     Coloration: Skin is pale.     Findings: Rash present. No erythema.     Comments: There is a healing, hyperpigmented and scaling macular rash in the right superior back consistent with a recent shingles outbreak.  Neurological:     Mental Status: She is alert.     Gait: Gait abnormal (The patient is ambulating with the use of a wheelchair.).  Psychiatric:        Mood and Affect: Mood normal.        Behavior: Behavior normal.        Thought Content: Thought content normal.        Judgment: Judgment normal.     Lab Review:     Component Value Date/Time   NA 136 06/24/2019 0848   NA 135 (L) 09/24/2017 0945   K 3.5 06/24/2019 0848   K 3.7 09/24/2017 0945   CL 94 (L) 06/24/2019 0848   CO2 34 (H) 06/24/2019 0848   CO2 30 (H) 09/24/2017 0945   GLUCOSE 124 (H) 06/24/2019 0848  GLUCOSE 101 09/24/2017 0945   BUN 14 06/24/2019 0848   BUN 15.3 09/24/2017 0945   CREATININE 1.11 (H) 06/24/2019 0848   CREATININE 0.8 09/24/2017 0945   CALCIUM 8.5 (L) 06/24/2019 0848   CALCIUM 9.1 09/24/2017 0945   PROT 6.2 (L) 06/24/2019 0848   PROT 6.6 09/24/2017 0945   ALBUMIN 2.9 (L) 06/24/2019 0848   ALBUMIN 3.3 (L) 09/24/2017 0945   AST 18 06/24/2019 0848   AST 16 09/24/2017 0945   ALT 9 06/24/2019 0848   ALT 12 09/24/2017 0945   ALKPHOS 60 06/24/2019 0848   ALKPHOS 52 09/24/2017 0945   BILITOT 0.4 06/24/2019 0848   BILITOT 1.13 09/24/2017 0945   GFRNONAA 46 (L) 06/24/2019 0848   GFRAA 53 (L) 06/24/2019 0848       Component Value Date/Time   WBC 10.1 06/24/2019 0848   WBC 12.3 (H) 06/12/2019 1110   RBC 3.18 (L) 06/24/2019 0848   HGB 7.6 (L) 06/24/2019 0848   HGB 10.8 (L) 09/24/2017 0945   HCT 25.5 (L) 06/24/2019  0848   HCT 32.8 (L) 09/24/2017 0945   PLT 326 06/24/2019 0848   PLT 174 09/24/2017 0945   MCV 80.2 06/24/2019 0848   MCV 93.2 09/24/2017 0945   MCH 23.9 (L) 06/24/2019 0848   MCHC 29.8 (L) 06/24/2019 0848   RDW 19.9 (H) 06/24/2019 0848   RDW 18.7 (H) 09/24/2017 0945   LYMPHSABS 0.7 06/24/2019 0848   LYMPHSABS 0.9 09/24/2017 0945   MONOABS 0.7 06/24/2019 0848   MONOABS 0.1 09/24/2017 0945   EOSABS 0.1 06/24/2019 0848   EOSABS 0.1 09/24/2017 0945   BASOSABS 0.0 06/24/2019 0848   BASOSABS 0.0 09/24/2017 0945   -------------------------------  Imaging from last 24 hours (if applicable):  Radiology interpretation: Ct Chest W Contrast  Result Date: 05/29/2019 CLINICAL DATA:  Restaging epithelial ovarian/fallopian tube/primary peritoneal carcinoma. EXAM: CT CHEST, ABDOMEN, AND PELVIS WITH CONTRAST TECHNIQUE: Multidetector CT imaging of the chest, abdomen and pelvis was performed following the standard protocol during bolus administration of intravenous contrast. CONTRAST:  170m OMNIPAQUE IOHEXOL 300 MG/ML  SOLN COMPARISON:  CT chest 04/26/2019 and CT abdomen pelvis 02/09/2019. FINDINGS: CT CHEST FINDINGS Cardiovascular: The heart size is normal. Aortic atherosclerosis. Three vessel coronary artery calcifications. No pericardial effusion. Mediastinum/Nodes: No enlarged mediastinal, hilar, or axillary lymph nodes. Thyroid gland, trachea, and esophagus demonstrate no significant findings. Lungs/Pleura: Trace left pleural effusion and small to moderate right pleural effusion are unchanged from 04/26/2019. Multiple small, less than 5 mm pulmonary nodules are identified within the right upper lobe. These are similar to the previous exam. Index nodule in the right upper lobe measures 3 mm, image 31/6. Musculoskeletal: Spondylosis identified within the thoracic spine. No aggressive lytic or sclerotic bone lesions identified. CT ABDOMEN PELVIS FINDINGS Hepatobiliary: No focal liver abnormality. Previous  cholecystectomy. There is chronic increase caliber of the common bile duct which measures up to 11 mm. Pancreas: Unremarkable. There is a cystic lesion within the neck of pancreas which measures up to 1.7 cm, image 57/4. Previously this measured up to 1.3 cm. Spleen: There are scattered low attenuation foci within the spleen. Unchanged from previous exam. Adrenals/Urinary Tract: Normal appearance of the adrenal glands. No kidney mass or hydronephrosis. The urinary bladder appears normal. Stomach/Bowel: Dilatation of the small and large bowel loops are again identified. The small bowel loops measure up to 2.8 cm, similar to the previous exam. The distal small bowel loops are decreased in caliber suggesting the low-grade partial obstruction as  mentioned previously. Vascular/Lymphatic: Aortic atherosclerosis. No abdominopelvic adenopathy. Reproductive: Previous hysterectomy. Decreased previously noted small volume ascites. No fluid collections identified. Mild peritoneal thickening in the pelvis and along the pericolic gutters noted, unchanged. Other: Ventral abdominal wall hernia containing nonobstructed loop of transverse colon. Unchanged from previous exam. Musculoskeletal: No acute or significant osseous findings. Previous ORIF of the left femur. Multi level spondylosis identified within the lumbar spine. No aggressive lytic or sclerotic bone lesions. IMPRESSION: 1. Interval resolution of small volume ascites noted within the pelvis. Continued mild peritoneal thickening consistent with peritoneal carcinomatosis. 2. No new or progressive findings identified within the chest, abdomen or pelvis. 3. Partial small bowel obstruction described on 02/09/2019 is similar to previous exam. 4. Unchanged appearance of small pleural effusions, right greater than left. 5. Cystic lesion within neck of pancreas is identified, indeterminate measuring 1.7 cm. Increased from 1.3 cm previously. According to consensus criteria follow-up  imaging in 6 months with repeat contrast enhanced pancreas protocol MRI or CT is advised. This recommendation follows ACR consensus guidelines: Management of Incidental Pancreatic Cysts: A White Paper of the ACR Incidental Findings Committee. J Am Coll Radiol 5573;22:025-427. 6. Tiny small nonspecific nodules within the right upper lobe are unchanged from previous exam. Electronically Signed   By: Kerby Moors M.D.   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 EXAM: CT CHEST, ABDOMEN, AND PELVIS WITH CONTRAST TECHNIQUE: Multidetector CT imaging of the chest, abdomen and pelvis was performed following the standard protocol during bolus administration of intravenous contrast. CONTRAST:  126m OMNIPAQUE IOHEXOL 300 MG/ML  SOLN COMPARISON:  CT chest 04/26/2019 and CT abdomen pelvis 02/09/2019. FINDINGS: CT CHEST FINDINGS Cardiovascular: The heart size is normal. Aortic atherosclerosis. Three vessel coronary artery calcifications. No pericardial effusion. Mediastinum/Nodes: No enlarged mediastinal, hilar, or axillary lymph nodes. Thyroid gland, trachea, and esophagus demonstrate no significant findings. Lungs/Pleura: Trace left pleural effusion and small to moderate right pleural effusion are unchanged from 04/26/2019. Multiple small, less than 5 mm pulmonary nodules are identified within the right upper lobe. These are similar to the previous exam. Index nodule in the right upper lobe measures 3 mm, image 31/6. Musculoskeletal: Spondylosis identified within the thoracic spine. No aggressive lytic or sclerotic bone lesions identified. CT ABDOMEN PELVIS FINDINGS Hepatobiliary: No focal liver abnormality. Previous cholecystectomy. There is chronic increase caliber of the common bile duct which measures up to 11 mm. Pancreas: Unremarkable. There is a cystic lesion within the neck of pancreas which  measures up to 1.7 cm, image 57/4. Previously this measured up to 1.3 cm. Spleen: There are scattered low attenuation foci within the spleen. Unchanged from previous exam. Adrenals/Urinary Tract: Normal appearance of the adrenal glands. No kidney mass or hydronephrosis. The urinary bladder appears normal. Stomach/Bowel: Dilatation of the small and large bowel loops are again identified. The small bowel loops measure up to 2.8 cm, similar to the previous exam. The distal small bowel loops are decreased in caliber suggesting the low-grade partial obstruction as mentioned previously. Vascular/Lymphatic: Aortic atherosclerosis. No abdominopelvic adenopathy. Reproductive: Previous hysterectomy. Decreased previously noted small volume ascites. No fluid collections identified. Mild peritoneal thickening in the pelvis and along the pericolic gutters noted, unchanged. Other: Ventral abdominal wall hernia containing nonobstructed loop of transverse colon. Unchanged from previous exam. Musculoskeletal: No acute or significant osseous findings. Previous ORIF of the left femur. Multi level spondylosis identified within the lumbar spine. No aggressive lytic  or sclerotic bone lesions. IMPRESSION: 1. Interval resolution of small volume ascites noted within the pelvis. Continued mild peritoneal thickening consistent with peritoneal carcinomatosis. 2. No new or progressive findings identified within the chest, abdomen or pelvis. 3. Partial small bowel obstruction described on 02/09/2019 is similar to previous exam. 4. Unchanged appearance of small pleural effusions, right greater than left. 5. Cystic lesion within neck of pancreas is identified, indeterminate measuring 1.7 cm. Increased from 1.3 cm previously. According to consensus criteria follow-up imaging in 6 months with repeat contrast enhanced pancreas protocol MRI or CT is advised. This recommendation follows ACR consensus guidelines: Management of Incidental Pancreatic  Cysts: A White Paper of the ACR Incidental Findings Committee. J Am Coll Radiol 8209;90:689-340. 6. Tiny small nonspecific nodules within the right upper lobe are unchanged from previous exam. Electronically Signed   By: Kerby Moors M.D.   On: 05/29/2019 15:13

## 2019-06-23 NOTE — Telephone Encounter (Signed)
[name] called- patient has been very weak this round of chemo. She has been confused on dates and times. She spent most of the day in bed yesterday. She denies any increase is SOB, nausea or vomiting. She states she is eating well, probably not drinking enough. Encouraged her to increase water intake. She states she will try.   Patient is scheduled for chemo on Friday and daughter would like to see if it would be possible to come in sooner to check blood work.    Patient also needs morphine refilled to [facility] in [place] please.

## 2019-06-24 ENCOUNTER — Inpatient Hospital Stay (HOSPITAL_BASED_OUTPATIENT_CLINIC_OR_DEPARTMENT_OTHER): Payer: Medicare Other | Admitting: Medical

## 2019-06-24 ENCOUNTER — Encounter: Payer: Self-pay | Admitting: Medical

## 2019-06-24 ENCOUNTER — Telehealth: Payer: Self-pay

## 2019-06-24 ENCOUNTER — Inpatient Hospital Stay: Payer: Medicare Other

## 2019-06-24 ENCOUNTER — Other Ambulatory Visit: Payer: Self-pay | Admitting: Medical

## 2019-06-24 ENCOUNTER — Other Ambulatory Visit: Payer: Self-pay

## 2019-06-24 ENCOUNTER — Encounter: Payer: Self-pay | Admitting: Hematology and Oncology

## 2019-06-24 ENCOUNTER — Other Ambulatory Visit: Payer: Self-pay | Admitting: Hematology and Oncology

## 2019-06-24 VITALS — BP 119/54 | HR 66 | Temp 98.1°F | Resp 18

## 2019-06-24 DIAGNOSIS — Z5111 Encounter for antineoplastic chemotherapy: Secondary | ICD-10-CM | POA: Diagnosis not present

## 2019-06-24 DIAGNOSIS — C562 Malignant neoplasm of left ovary: Secondary | ICD-10-CM | POA: Diagnosis not present

## 2019-06-24 DIAGNOSIS — D638 Anemia in other chronic diseases classified elsewhere: Secondary | ICD-10-CM

## 2019-06-24 DIAGNOSIS — L299 Pruritus, unspecified: Secondary | ICD-10-CM | POA: Diagnosis not present

## 2019-06-24 DIAGNOSIS — D649 Anemia, unspecified: Secondary | ICD-10-CM

## 2019-06-24 DIAGNOSIS — G893 Neoplasm related pain (acute) (chronic): Secondary | ICD-10-CM

## 2019-06-24 DIAGNOSIS — B029 Zoster without complications: Secondary | ICD-10-CM

## 2019-06-24 DIAGNOSIS — E039 Hypothyroidism, unspecified: Secondary | ICD-10-CM

## 2019-06-24 LAB — CBC WITH DIFFERENTIAL (CANCER CENTER ONLY)
Abs Immature Granulocytes: 0.09 10*3/uL — ABNORMAL HIGH (ref 0.00–0.07)
Basophils Absolute: 0 10*3/uL (ref 0.0–0.1)
Basophils Relative: 0 %
Eosinophils Absolute: 0.1 10*3/uL (ref 0.0–0.5)
Eosinophils Relative: 1 %
HCT: 25.5 % — ABNORMAL LOW (ref 36.0–46.0)
Hemoglobin: 7.6 g/dL — ABNORMAL LOW (ref 12.0–15.0)
Immature Granulocytes: 1 %
Lymphocytes Relative: 6 %
Lymphs Abs: 0.7 10*3/uL (ref 0.7–4.0)
MCH: 23.9 pg — ABNORMAL LOW (ref 26.0–34.0)
MCHC: 29.8 g/dL — ABNORMAL LOW (ref 30.0–36.0)
MCV: 80.2 fL (ref 80.0–100.0)
Monocytes Absolute: 0.7 10*3/uL (ref 0.1–1.0)
Monocytes Relative: 7 %
Neutro Abs: 8.5 10*3/uL — ABNORMAL HIGH (ref 1.7–7.7)
Neutrophils Relative %: 85 %
Platelet Count: 326 10*3/uL (ref 150–400)
RBC: 3.18 MIL/uL — ABNORMAL LOW (ref 3.87–5.11)
RDW: 19.9 % — ABNORMAL HIGH (ref 11.5–15.5)
WBC Count: 10.1 10*3/uL (ref 4.0–10.5)
nRBC: 0 % (ref 0.0–0.2)

## 2019-06-24 LAB — CMP (CANCER CENTER ONLY)
ALT: 9 U/L (ref 0–44)
AST: 18 U/L (ref 15–41)
Albumin: 2.9 g/dL — ABNORMAL LOW (ref 3.5–5.0)
Alkaline Phosphatase: 60 U/L (ref 38–126)
Anion gap: 8 (ref 5–15)
BUN: 14 mg/dL (ref 8–23)
CO2: 34 mmol/L — ABNORMAL HIGH (ref 22–32)
Calcium: 8.5 mg/dL — ABNORMAL LOW (ref 8.9–10.3)
Chloride: 94 mmol/L — ABNORMAL LOW (ref 98–111)
Creatinine: 1.11 mg/dL — ABNORMAL HIGH (ref 0.44–1.00)
GFR, Est AFR Am: 53 mL/min — ABNORMAL LOW (ref >60)
GFR, Estimated: 46 mL/min — ABNORMAL LOW (ref >60)
Glucose, Bld: 124 mg/dL — ABNORMAL HIGH (ref 70–99)
Potassium: 3.5 mmol/L (ref 3.5–5.1)
Sodium: 136 mmol/L (ref 135–145)
Total Bilirubin: 0.4 mg/dL (ref 0.3–1.2)
Total Protein: 6.2 g/dL — ABNORMAL LOW (ref 6.5–8.1)

## 2019-06-24 LAB — PREPARE RBC (CROSSMATCH)

## 2019-06-24 LAB — SAMPLE TO BLOOD BANK

## 2019-06-24 MED ORDER — MORPHINE SULFATE ER 15 MG PO TBCR: 15.0000 mg | EXTENDED_RELEASE_TABLET | Freq: Two times a day (BID) | ORAL | 0 refills | Status: DC

## 2019-06-24 MED ORDER — ACETAMINOPHEN 325 MG PO TABS
650.0000 mg | ORAL_TABLET | Freq: Once | ORAL | Status: AC
  Administered 2019-06-24: 10:00:00 650 mg via ORAL

## 2019-06-24 MED ORDER — ACETAMINOPHEN 325 MG PO TABS
ORAL_TABLET | ORAL | Status: AC
  Filled 2019-06-24: qty 2

## 2019-06-24 MED ORDER — HEPARIN SOD (PORK) LOCK FLUSH 100 UNIT/ML IV SOLN
250.0000 [IU] | INTRAVENOUS | Status: DC | PRN
  Filled 2019-06-24: qty 5

## 2019-06-24 MED ORDER — OXYCODONE-ACETAMINOPHEN 5-325 MG PO TABS
ORAL_TABLET | ORAL | Status: AC
  Filled 2019-06-24: qty 1

## 2019-06-24 MED ORDER — PREDNISONE 20 MG PO TABS
20.0000 mg | ORAL_TABLET | Freq: Once | ORAL | Status: AC
  Administered 2019-06-24: 10:00:00 20 mg via ORAL

## 2019-06-24 MED ORDER — SODIUM CHLORIDE 0.9% FLUSH
10.0000 mL | INTRAVENOUS | Status: AC | PRN
  Administered 2019-06-24: 14:00:00 10 mL
  Filled 2019-06-24: qty 10

## 2019-06-24 MED ORDER — HEPARIN SOD (PORK) LOCK FLUSH 100 UNIT/ML IV SOLN
500.0000 [IU] | Freq: Every day | INTRAVENOUS | Status: AC | PRN
  Administered 2019-06-24: 14:00:00 500 [IU]
  Filled 2019-06-24: qty 5

## 2019-06-24 MED ORDER — SODIUM CHLORIDE 0.9% IV SOLUTION
250.0000 mL | Freq: Once | INTRAVENOUS | Status: AC
  Administered 2019-06-24: 250 mL via INTRAVENOUS
  Filled 2019-06-24: qty 250

## 2019-06-24 MED ORDER — OXYCODONE-ACETAMINOPHEN 5-325 MG PO TABS
1.0000 | ORAL_TABLET | Freq: Once | ORAL | Status: AC
  Administered 2019-06-24: 1 via ORAL

## 2019-06-24 NOTE — Patient Instructions (Addendum)
Blood Transfusion, Adult, Care After This sheet gives you information about how to care for yourself after your procedure. Your doctor may also give you more specific instructions. If you have problems or questions, contact your doctor. Follow these instructions at home:   Take over-the-counter and prescription medicines only as told by your doctor.  Go back to your normal activities as told by your doctor.  Follow instructions from your doctor about how to take care of the area where an IV tube was put into your vein (insertion site). Make sure you: ? Wash your hands with soap and water before you change your bandage (dressing). If there is no soap and water, use hand sanitizer. ? Change your bandage as told by your doctor.  Check your IV insertion site every day for signs of infection. Check for: ? More redness, swelling, or pain. ? More fluid or blood. ? Warmth. ? Pus or a bad smell. Contact a doctor if:  You have more redness, swelling, or pain around the IV insertion site.  You have more fluid or blood coming from the IV insertion site.  Your IV insertion site feels warm to the touch.  You have pus or a bad smell coming from the IV insertion site.  Your pee (urine) turns pink, red, or brown.  You feel weak after doing your normal activities. Get help right away if:  You have signs of a serious allergic or body defense (immune) system reaction, including: ? Itchiness. ? Hives. ? Trouble breathing. ? Anxiety. ? Pain in your chest or lower back. ? Fever, flushing, and chills. ? Fast pulse. ? Rash. ? Watery poop (diarrhea). ? Throwing up (vomiting). ? Dark pee. ? Serious headache. ? Dizziness. ? Stiff neck. ? Yellow color in your face or the white parts of your eyes (jaundice). Summary  After a blood transfusion, return to your normal activities as told by your doctor.  Every day, check for signs of infection where the IV tube was put into your vein.  Some  signs of infection are warm skin, more redness and pain, more fluid or blood, and pus or a bad smell where the needle went in.  Contact your doctor if you feel weak or have any unusual symptoms. This information is not intended to replace advice given to you by your health care provider. Make sure you discuss any questions you have with your health care provider. Document Released: 10/15/2014 Document Revised: 01/29/2018 Document Reviewed: 05/18/2016 Elsevier Patient Education  2020 [employer]  

## 2019-06-24 NOTE — Telephone Encounter (Signed)
Spoke with [name], patients daughter concerning MS Contin sent in by Dr. Alvy Bimler.  [name] says that she received an e-mail from [facility] that it is currently on hold.  Advised patient it may be awaiting PA and to let us know by Friday when patient comes for treatment if they have not heard anything about this medication.

## 2019-06-24 NOTE — Progress Notes (Signed)
Pt taking antiviral for shingles infection, rash still present but dried with no bleeding or drainage or fever/chills, no resp changes.  Labs drawn in [facility] from port.  Pt received 2 units PRBCs today, tolerated well.  Reports feeling better at end of transfusions.  VSS.  Ate and drank during infusion without any issues.  Denies any questions or concerns at time of d/c, aware to f/u as needed and given d/c instructions.  Escorted via wc to exit with belongings and home oxygen tank, daughter on the way to take pt home.

## 2019-06-25 ENCOUNTER — Telehealth: Payer: Self-pay | Admitting: *Deleted

## 2019-06-25 LAB — TYPE AND SCREEN
ABO/RH(D): O POS
Antibody Screen: NEGATIVE
Unit division: 0
Unit division: 0

## 2019-06-25 LAB — BPAM RBC
Blood Product Expiration Date: 202010142359
Blood Product Expiration Date: 202010142359
ISSUE DATE / TIME: 202009161043
ISSUE DATE / TIME: 202009161043
Unit Type and Rh: 5100
Unit Type and Rh: 5100

## 2019-06-25 MED ORDER — PREDNISONE 10 MG PO TABS: ORAL_TABLET | ORAL | 0 refills | Status: DC

## 2019-06-25 MED ORDER — HYDROCORTISONE VALERATE 0.2 % EX CREA: 1.0000 "application " | TOPICAL_CREAM | Freq: Two times a day (BID) | CUTANEOUS | 0 refills | Status: DC

## 2019-06-25 NOTE — Telephone Encounter (Signed)
[facility] faxed Prior authorization request for Morphine.  Request to [facility] letter tray receptacle of Prior Authorization requests and forms for review.

## 2019-06-26 ENCOUNTER — Inpatient Hospital Stay (HOSPITAL_BASED_OUTPATIENT_CLINIC_OR_DEPARTMENT_OTHER): Payer: Medicare Other | Admitting: Hematology and Oncology

## 2019-06-26 ENCOUNTER — Other Ambulatory Visit: Payer: Self-pay

## 2019-06-26 ENCOUNTER — Encounter: Payer: Self-pay | Admitting: Hematology and Oncology

## 2019-06-26 ENCOUNTER — Encounter: Payer: Self-pay | Admitting: Medical

## 2019-06-26 ENCOUNTER — Other Ambulatory Visit: Payer: Self-pay | Admitting: *Deleted

## 2019-06-26 ENCOUNTER — Inpatient Hospital Stay: Payer: Medicare Other

## 2019-06-26 ENCOUNTER — Inpatient Hospital Stay (HOSPITAL_BASED_OUTPATIENT_CLINIC_OR_DEPARTMENT_OTHER): Payer: Medicare Other | Admitting: Medical

## 2019-06-26 DIAGNOSIS — B0229 Other postherpetic nervous system involvement: Secondary | ICD-10-CM | POA: Diagnosis not present

## 2019-06-26 DIAGNOSIS — B029 Zoster without complications: Secondary | ICD-10-CM

## 2019-06-26 DIAGNOSIS — C562 Malignant neoplasm of left ovary: Secondary | ICD-10-CM

## 2019-06-26 DIAGNOSIS — Z5111 Encounter for antineoplastic chemotherapy: Secondary | ICD-10-CM | POA: Diagnosis not present

## 2019-06-26 DIAGNOSIS — Z7189 Other specified counseling: Secondary | ICD-10-CM

## 2019-06-26 DIAGNOSIS — D638 Anemia in other chronic diseases classified elsewhere: Secondary | ICD-10-CM | POA: Diagnosis not present

## 2019-06-26 DIAGNOSIS — E039 Hypothyroidism, unspecified: Secondary | ICD-10-CM

## 2019-06-26 LAB — CBC WITH DIFFERENTIAL/PLATELET
Abs Immature Granulocytes: 0.07 10*3/uL (ref 0.00–0.07)
Basophils Absolute: 0 10*3/uL (ref 0.0–0.1)
Basophils Relative: 0 %
Eosinophils Absolute: 0.1 10*3/uL (ref 0.0–0.5)
Eosinophils Relative: 1 %
HCT: 34 % — ABNORMAL LOW (ref 36.0–46.0)
Hemoglobin: 10.4 g/dL — ABNORMAL LOW (ref 12.0–15.0)
Immature Granulocytes: 1 %
Lymphocytes Relative: 6 %
Lymphs Abs: 0.6 10*3/uL — ABNORMAL LOW (ref 0.7–4.0)
MCH: 25.3 pg — ABNORMAL LOW (ref 26.0–34.0)
MCHC: 30.6 g/dL (ref 30.0–36.0)
MCV: 82.7 fL (ref 80.0–100.0)
Monocytes Absolute: 0.3 10*3/uL (ref 0.1–1.0)
Monocytes Relative: 3 %
Neutro Abs: 8.4 10*3/uL — ABNORMAL HIGH (ref 1.7–7.7)
Neutrophils Relative %: 89 %
Platelets: 334 10*3/uL (ref 150–400)
RBC: 4.11 MIL/uL (ref 3.87–5.11)
RDW: 18.9 % — ABNORMAL HIGH (ref 11.5–15.5)
WBC: 9.4 10*3/uL (ref 4.0–10.5)
nRBC: 0 % (ref 0.0–0.2)

## 2019-06-26 LAB — COMPREHENSIVE METABOLIC PANEL
ALT: 9 U/L (ref 0–44)
AST: 14 U/L — ABNORMAL LOW (ref 15–41)
Albumin: 3.1 g/dL — ABNORMAL LOW (ref 3.5–5.0)
Alkaline Phosphatase: 68 U/L (ref 38–126)
Anion gap: 10 (ref 5–15)
BUN: 16 mg/dL (ref 8–23)
CO2: 33 mmol/L — ABNORMAL HIGH (ref 22–32)
Calcium: 8.8 mg/dL — ABNORMAL LOW (ref 8.9–10.3)
Chloride: 94 mmol/L — ABNORMAL LOW (ref 98–111)
Creatinine, Ser: 0.99 mg/dL (ref 0.44–1.00)
GFR calc Af Amer: >60 mL/min (ref >60)
GFR calc non Af Amer: 52 mL/min — ABNORMAL LOW (ref >60)
Glucose, Bld: 144 mg/dL — ABNORMAL HIGH (ref 70–99)
Potassium: 3.7 mmol/L (ref 3.5–5.1)
Sodium: 137 mmol/L (ref 135–145)
Total Bilirubin: 0.4 mg/dL (ref 0.3–1.2)
Total Protein: 6.6 g/dL (ref 6.5–8.1)

## 2019-06-26 LAB — LIPID PANEL
Cholesterol: 150 mg/dL (ref 0–200)
HDL: 38 mg/dL — ABNORMAL LOW (ref >40)
LDL Cholesterol: 92 mg/dL (ref 0–99)
Total CHOL/HDL Ratio: 3.9 RATIO
Triglycerides: 102 mg/dL (ref <150)
VLDL: 20 mg/dL (ref 0–40)

## 2019-06-26 LAB — SAMPLE TO BLOOD BANK

## 2019-06-26 MED ORDER — SODIUM CHLORIDE 0.9 % IV SOLN
64.0000 mg/m2 | Freq: Once | INTRAVENOUS | Status: AC
  Administered 2019-06-26: 126 mg via INTRAVENOUS
  Filled 2019-06-26: qty 21

## 2019-06-26 MED ORDER — SODIUM CHLORIDE 0.9 % IV SOLN
Freq: Once | INTRAVENOUS | Status: AC
  Administered 2019-06-26: 12:00:00 via INTRAVENOUS
  Filled 2019-06-26: qty 250

## 2019-06-26 MED ORDER — FAMOTIDINE IN NACL 20-0.9 MG/50ML-% IV SOLN
20.0000 mg | Freq: Once | INTRAVENOUS | Status: AC
  Administered 2019-06-26: 20 mg via INTRAVENOUS

## 2019-06-26 MED ORDER — DIPHENHYDRAMINE HCL 50 MG/ML IJ SOLN
INTRAMUSCULAR | Status: AC
  Filled 2019-06-26: qty 1

## 2019-06-26 MED ORDER — DIPHENHYDRAMINE HCL 50 MG/ML IJ SOLN
50.0000 mg | Freq: Once | INTRAMUSCULAR | Status: AC
  Administered 2019-06-26: 50 mg via INTRAVENOUS

## 2019-06-26 MED ORDER — FAMOTIDINE IN NACL 20-0.9 MG/50ML-% IV SOLN
INTRAVENOUS | Status: AC
  Filled 2019-06-26: qty 50

## 2019-06-26 MED ORDER — SODIUM CHLORIDE 0.9 % IV SOLN
20.0000 mg | Freq: Once | INTRAVENOUS | Status: AC
  Administered 2019-06-26: 20 mg via INTRAVENOUS
  Filled 2019-06-26: qty 2

## 2019-06-26 MED ORDER — GABAPENTIN 300 MG PO CAPS: 300.0000 mg | ORAL_CAPSULE | Freq: Every day | ORAL | 0 refills | Status: DC

## 2019-06-26 MED ORDER — SODIUM CHLORIDE 0.9% FLUSH
10.0000 mL | INTRAVENOUS | Status: DC | PRN
  Administered 2019-06-26: 10 mL
  Filled 2019-06-26: qty 10

## 2019-06-26 MED ORDER — HEPARIN SOD (PORK) LOCK FLUSH 100 UNIT/ML IV SOLN
500.0000 [IU] | Freq: Once | INTRAVENOUS | Status: AC | PRN
  Administered 2019-06-26: 500 [IU]
  Filled 2019-06-26: qty 5

## 2019-06-26 NOTE — Patient Instructions (Signed)
[facility] Discharge Instructions for Patients Receiving Chemotherapy  Today you received the following chemotherapy agents :  Taxol.  To help prevent nausea and vomiting after your treatment, we encourage you to take your nausea medication as prescribed.   If you develop nausea and vomiting that is not controlled by your nausea medication, call the clinic.   BELOW ARE SYMPTOMS THAT SHOULD BE REPORTED IMMEDIATELY:  *FEVER GREATER THAN 100.5 F  *CHILLS WITH OR WITHOUT FEVER  NAUSEA AND VOMITING THAT IS NOT CONTROLLED WITH YOUR NAUSEA MEDICATION  *UNUSUAL SHORTNESS OF BREATH  *UNUSUAL BRUISING OR BLEEDING  TENDERNESS IN MOUTH AND THROAT WITH OR WITHOUT PRESENCE OF ULCERS  *URINARY PROBLEMS  *BOWEL PROBLEMS  UNUSUAL RASH Items with * indicate a potential emergency and should be followed up as soon as possible.  Feel free to call the clinic should you have any questions or concerns. The clinic phone number is [phone].  Please show the [facility] at check-in to the Emergency Department and triage nurse.

## 2019-06-26 NOTE — Progress Notes (Signed)
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[place]) Even though she has positive response to treatment, she had multiple side effects related to chemotherapy Despite poor quality of life, the patient wants to continue on treatment We will continue treatment every other week along with tumor marker monitoring during and CT every 3 months   Anemia, chronic disease She has multifactorial anemia, secondary to anemia chronic disease from treatment and chronic kidney disease She have received transfusion support recently She does not need blood transfusion today I warned her about blood supply shortage that her transfusion threshold might change in the future   Post herpetic neuralgia She has severe postherpetic neuralgia She will continue on gabapentin I have recently refill her prescription pain medicine We will try to get her insurance to approve MS Contin due to severe uncontrolled pain I suspect her pain is worse due to ongoing chemotherapy Again, I had extensive discussion with the patient about potentially holding treatment to allow recovery but she declined  Goals of care, counseling/discussion I had numerous goals of care discussion with the patient and her daughter, [name] in the past Despite extremely poor quality of life, the patient wants to continue treatment She understood that treatment goal is palliative   No orders of the defined types were placed in this encounter.   INTERVAL HISTORY: Please see below for problem oriented charting. She returns for chemotherapy follow-up She was seen 2  days ago due to uncontrolled pain and postherpetic neuralgia She was found to be anemic and received transfusion support Denies recent fever or chills No nausea or constipation  SUMMARY OF ONCOLOGIC HISTORY: Oncology History Overview Note  High grade serous ER 90%, PR 0% BRCA 1: no loss of expression MMR normal  R1 resection, platinum refractory, progressed on Femara, carboplatin and Gemzar   Left ovarian epithelial cancer ([name])  02/18/2016 Tumor Marker   Patient's tumor was tested for the following markers: CA125 Results of the tumor marker test revealed 45   05/22/2016 Tumor Marker   Patient's tumor was tested for the following markers: CA125 Results of the tumor marker test revealed 53   05/22/2016 Imaging   Outside pelvic US showed 4.1 cm adnexa mass   06/24/2017 Imaging   Ct abdomen and pelvis:  1. Interim finding of moderate ascites within the abdomen and pelvis with additional finding of diffuse nodular infiltration of the omentum and anterior mesenteric fat, the appearance would be consistent with peritoneal carcinomatosis/metastatic disease. Increasing retroperitoneal and upper abdominal adenopathy. 2. Re- demonstrated 3.8 cm cyst in the right adnexa. Enlarging soft tissue density in the left adnexa now with possible cystic component posteriorly. In light of the above findings, concern is for ovarian neoplasm. Correlation with pelvic ultrasound recommended. 3. Small right-sided pleural effusion, new since prior study 4. Stable hypodense splenic lesions since 2017.    06/25/2017 Imaging   US pelvis: 2.9 cm simple appearing cyst in the right ovary. Left ovary grossly unremarkable. Large volume ascites in the pelvis   06/30/2017 - 07/01/2017 Hospital Admission   She was admitted for evaluation of abdominal pain and ascites   07/01/2017 Pathology Results   PERITONEAL/ASCITIC FLUID(SPECIMEN 1 OF  1 COLLECTED 07/01/17): - POORLY DIFFERENTIATED CARCINOMA; SEE  COMMENT Source Peritoneal/Ascitic Fluid, (specimen 1 of 1 collected 07/01/17) Gross Specimen: Received is/are 1000 cc's of brownish fluid. (BS:bs) Prepared: # Smears: 0 # Concentration Technique Slides (i.e. ThinPrep): 1 # Cell Block: 1 Additional Studies: Also received Hematology slide - [id number]. Comment The tumor cells are positive for cytokeratin 7 and Pax-8 but negative for cytokeratin 20, CDX-2, GATA-3, Napsin-A and TTF-1. Based on the immunoprofile a gynecology primary is favored   07/01/2017 Procedure   Successful ultrasound-guided diagnostic and therapeutic paracentesis yielding 2.5 liters of peritoneal fluid   07/07/2017 - 07/09/2017 Hospital Admission   She was admitted for management of malignant ascites   07/08/2017 Procedure   Successful ultrasound-guided therapeutic paracentesis yielding 2.7 liters liters of peritoneal fluid   07/12/2017 Procedure   Successful ultrasound-guided paracentesis yielding 1450 mL of peritoneal fluid   07/18/2017 - 07/24/2017 Hospital Admission   She was admitted for expedited treatment   07/18/2017 Tumor Marker   Patient's tumor was tested for the following markers: CA125 Results of the tumor marker test revealed 1941   07/19/2017 - 02/04/2018 Chemotherapy   The patient had 6 cycles of carboplatin & Taxol for chemotherapy treatment, followed by 3 more cycles of carboplatin only    07/19/2017 - 02/04/2018 Chemotherapy   The patient had carboplatin and taxol   08/06/2017 Procedure   Successful ultrasound-guided therapeutic paracentesis yielding 2.6 liters of peritoneal fluid.   08/09/2017 Tumor Marker   Patient's tumor was tested for the following markers: CA125 Results of the tumor marker test revealed 1665   08/15/2017 Tumor Marker   Patient's tumor was tested for the following markers: CA125 Results of the tumor marker test revealed 937.9   08/20/2017 Imaging   ECHO: Normal LV size with EF 60-65%. Normal RV size and systolic function.  No significant valvular abnormalities.   09/18/2017 Imaging   Chest Impression:  1. No evidence thoracic metastasis. 2. Interval increase and RIGHT pleural effusion.  Abdomen / Pelvis Impression:  1. Interval decrease in intraperitoneal free fluid. 2. Interval decrease in omental nodularity in the LEFT ventral peritoneal space. 3. Interval decrease in nodularity associated with the LEFT ovary. 4. Cystic portion of the RIGHT ovary is increased mildly in size.   09/20/2017 Tumor Marker   Patient's tumor was tested for the following markers: CA125 Results of the tumor marker test revealed 347   10/14/2017 Tumor Marker   Patient's tumor was tested for the following markers: CA125 Results of the tumor marker test revealed 307.4   11/04/2017 Tumor Marker   Patient's tumor was tested for the following markers: CA125 Results of the tumor marker test revealed 262.5   11/28/2017 Imaging   1. Interval decrease in right pleural effusion with resolution of right atelectasis seen previously. 2. New small left pleural effusion, symmetric to the right. 3. No intraperitoneal free fluid on the current study. 4. Continued further decrease in left omental disease, appearing less confluent today than on the prior study. 5. Left ovary remains normal in appearance today and the right adnexal cystic lesion is decreased in size compared to prior study. 6. 14 mm pancreatic cyst is unchanged. Continued attention on follow-up imaging recommended. 7. Aortic Atherosclerois (ICD10-170.0)   12/13/2017 Tumor Marker   Patient's tumor was tested for the following markers: CA125 Results of the tumor marker test revealed 197.7   01/03/2018 Tumor Marker   Patient's tumor was tested for the following markers: CA125 Results of the tumor marker test  revealed 183.1   01/14/2018 Tumor Marker   Patient's tumor was tested for the following markers: CA125 Results of the tumor marker test revealed 177.4   02/04/2018 Tumor  Marker   Patient's tumor was tested for the following markers: CA125 Results of the tumor marker test revealed 168.5   02/25/2018 Imaging   1. Omental carcinomatosis appears qualitatively stable to slightly decreased. Stable mild peritoneal thickening in the paracolic gutters. Stable right adnexal cyst. No ascites. No new or progressive metastatic disease in the abdomen or pelvis. 2. Small dependent right pleural effusion is increased. 3. Cystic pancreatic body lesion is decreased and now subcentimeter in size, suggesting a benign lesion. 4. Aortic Atherosclerosis (ICD10-I70.0).   03/03/2018 - 03/07/2018 Hospital Admission   She was hospitalized for GI bleed requiring blood transfusions. Xarelto was placed on hold   03/07/2018 PET scan   1. Persistent hazy omental interstitial nodularity but no hypermetabolism or discrete measurable nodules. No abdominal ascites. 2. No findings for metastatic disease involving the chest. 3. Moderate-sized right pleural effusion and small left pleural effusion.    03/20/2018 Pathology Results   1. Ovary and fallopian tube, right - OVARY AND FALLOPIAN TUBE INVOLVED BY SEROUS CARCINOMA. - PARATUBAL CYST. 2. Uterus +/- tubes/ovaries, neoplastic, cervix, left ovary and fallopian tube - LEFT OVARY: HIGH GRADE SEROUS CARCINOMA WITH TREATMENT EFFECT, SPANNING 2.5 CM. CARCINOMA INVOLVES OVARIAN SURFACE. SEE ONCOLOGY TABLE. - LEFT FALLOPIAN TUBE: INVOLVED BY SEROUS CARCINOMA. - UTERUS: -ENDOMETRIUM: INACTIVE ENDOMETRIUM. NO HYPERPLASIA OR MALIGNANCY. -MYOMETRIUM: UNREMARKABLE. NO MALIGNANCY. -SEROSA: INVOLVED BY SEROUS CARCINOMA. - CERVIX: ENDOCERVICAL POLYP. NO MALIGNANCY. 3. Omentum, resection for tumor - INVOLVED BY SEROUS CARCINOMA. 4. Soft tissue, biopsy, mesenteric nodule - INVOLVED BY SEROUS CARCINOMA. Microscopic Comment 2. OVARY or FALLOPIAN TUBE or PRIMARY PERITONEUM: Procedure: Total hysterectomy and bilateral salpingo-oophorectomy. Omentectomy.  Mesenteric lymph node biopsy. Specimen Integrity: Intact. Tumor Site: Left ovary. Ovarian Surface Involvement (required only if applicable): Present. Fallopian Tube Surface Involvement (required only if applicable): Present, bilateral. Tumor Size: 2.5 cm. Histologic Type: High grade serous carcinoma. Histologic Grade: High grade. Implants (required for advanced stage serous/seromucinous borderline tumors only): N/A. Other Tissue/ Organ Involvement: Bilateral fallopian tubes, right ovary, uterine serosa, omentum. Largest Extrapelvic Peritoneal Focus (required only if applicable): Microscopic, estimated 0.5 cm (omentum). Peritoneal/Ascitic Fluid: Prior Positive ([id number]). Treatment Effect (required only for high-grade serous carcinomas): Present in left ovary. CRS2. Regional Lymph Nodes: No lymph nodes submitted/identified. Pathologic Stage Classification (pTNM, AJCC 8th Edition): ypT3b, ypNX Representative Tumor Block: 1A, 1B, 97F, 49F. Comment(s): The right ovary has only surface deposits with a large paratubal cyst. The left ovary has intraparenchymal tumor with associated treatment effect. Thus the tumor location is classified as a left ovarian primary.   03/20/2018 Surgery   Procedure(s) Performed:  1. Exploratory laparotomy with total hysterectomy and bilateral salpingo-oophorectomy 2. Infragastic Omentectomy  3. Debulking to <1cm gross residual disease   Surgeon: Mart Piggs, MD  Specimens: Uterus Cervix, Bilateral tubes / ovaries and omentum. Mesenteric nodule.  Operative Findings: Debulked to gross residual disease <1cm; however there is miliary disease in multiple locations including the majority of the abdominal peritoneum (anterior abdominal wall, bilateral gutters), diaphragm (Right>left), majority of small bowel mesentary. Normal appendix. Normal small uterus. Right ovary with a cystic lesion ~3cm, some adhesive disease of right adnexa to rectum/sigmoid. Gross  omental disease, which was resected with the omentectomy. Smooth liver surface, but again, diaphragmatic disease noted.      03/20/2018 Genetic Testing   Patient has  genetic testing done for ER/PR. Results revealed patient has ER: 90%, PR 0%.    03/31/2018 Tumor Marker   Patient's tumor was tested for the following markers: CA125 Results of the tumor marker test revealed 215.8    Genetic Testing   Patient has genetic testing done for BRCA 1. Results revealed patient has the following: BRCA 1: no loss of expression.    Genetic Testing   Patient has genetic testing done for MMR . Results revealed patient has the following:  MMR: normal   03/31/2018 Genetic Testing   Patient has genetic testing done for BRCA1/2. Results revealed patient has no actionable mutations. She is found to have Jessie genetic change of unknown significance   04/21/2018 Imaging   1. No definite findings of residual or recurrent metastatic disease in the abdomen or pelvis status post interval TAHBSO and omentectomy. Stable minimal thickening in the paracolic gutters without discrete peritoneal nodularity. 2. Trace free fluid in the pelvic cul-de-sac. 3. Stable small dependent right pleural effusion. 4. Subcentimeter pancreatic body cystic lesion is stable to slightly decreased. 5. Aortic Atherosclerosis (ICD10-I70.0).   04/21/2018 Tumor Marker   Patient's tumor was tested for the following markers: CA125 Results of the tumor marker test revealed 187.3   04/23/2018 - 09/12/2018 Anti-estrogen oral therapy   She is placed on Femara   05/08/2018 Procedure   Successful ultrasound guided right thoracentesis yielding 800 mL of pleural fluid. Fluid cytology is negative for malignancy    05/28/2018 Tumor Marker   Patient's tumor was tested for the following markers: CA125 Results of the tumor marker test revealed 190   06/30/2018 Tumor Marker   Patient's tumor was tested for the following markers: CA125 Results  of the tumor marker test revealed 148   07/23/2018 Imaging   Status post hysterectomy and bilateral salpingo-oophorectomy.  Very mild peritoneal thickening/nodularity, equivocal but worrisome for very mild peritoneal disease. Attention on follow-up is suggested.  Small right pleural effusion with indwelling pleural drain.   07/23/2018 Tumor Marker   Patient's tumor was tested for the following markers: CA125 Results of the tumor marker test revealed 215   09/11/2018 Tumor Marker   Patient's tumor was tested for the following markers: CA-125. Results of the tumor marker test revealed 1323   09/12/2018 Imaging   1. Mildly progressive ascites with some mild degree of nodularity most appreciable along the left adnexa, right paracolic gutter, right upper quadrant, compatible with peritoneal spread of tumor. 2. Small right pleural effusion with indwelling right pleural catheter again noted. Mild increase in atelectasis anteriorly at the right lung base. 3. Aortic Atherosclerosis (ICD10-I70.0). Coronary atherosclerosis. 4. Several tiny hypodense lesions in the spleen are technically nonspecific, but stable. 5. Air fluid level in the rectum compatible with diarrheal process. 6. Prominent bilateral hip arthropathy. Left hip screw noted.   09/22/2018 - 02/02/2019 Chemotherapy   The patient had carboplatin and gemzar   09/29/2018 Tumor Marker   Patient's tumor was tested for the following markers: CA125 Results of the tumor marker test revealed 1219   09/29/2018 Adverse Reaction   Cycle 1 day 8 of Gemzar was omitted due to severe anemia   10/20/2018 Tumor Marker   Patient's tumor was tested for the following markers: CA125 Results of the tumor marker test revealed 402   11/13/2018 Tumor Marker   Patient's tumor was tested for the following markers: CA125 Results of the tumor marker test revealed 437   11/26/2018 Imaging   1. Mild residual peritoneal thickening, overall  decreased from  09/11/2018, with resolution of previously seen ascites. 2. Tiny residual right pleural effusion with small bore chest tube in place. 3. Supraumbilical midline ventral hernia has a wide neck and contains unobstructed colon. 4.  Aortic atherosclerosis (ICD10-170.0).    01/05/2019 Tumor Marker   Patient's tumor was tested for the following markers: CA125 Results of the tumor marker test revealed 929   02/02/2019 Tumor Marker   Patient's tumor was tested for the following markers: CA125 Results of the tumor marker test revealed 1198   02/09/2019 Imaging   1. New mild ascites. Mild peritoneal thickening shows no significant change, but remains consistent with peritoneal carcinomatosis. 2. Increased tiny bilateral pleural effusions. 3. Partial small bowel obstruction with transition point in the anterior lower abdomen, just deep to the anterior abdominal wall. 4. Stable small ventral abdominal wall hernia containing transverse colon.   02/17/2019 -  Chemotherapy   The patient had PACLitaxel for chemotherapy treatment.    03/05/2019 Tumor Marker   Patient's tumor was tested for the following markers: CA125 Results of the tumor marker test revealed 1243   03/11/2019 Imaging   CXR Small bilateral subpulmonic effusions with basilar atelectasis. These are probably slightly larger than were seen in March.   03/30/2019 Tumor Marker   Patient's tumor was tested for the following markers: CA-125 Results of the tumor marker test revealed 776   05/01/2019 Tumor Marker   Patient's tumor was tested for the following markers: CA-125 Results of the tumor marker test revealed 180   05/19/2019 Tumor Marker   Patient's tumor was tested for the following markers: CA-125 Results of the tumor marker test revealed 163   05/29/2019 Imaging   1. Interval resolution of small volume ascites noted within the pelvis. Continued mild peritoneal thickening consistent with peritoneal carcinomatosis. 2. No new or  progressive findings identified within the chest, abdomen or pelvis. 3. Partial small bowel obstruction described on 02/09/2019 is similar to previous exam. 4. Unchanged appearance of small pleural effusions, right greater than left. 5. Cystic lesion within neck of pancreas is identified, indeterminate measuring 1.7 cm. Increased from 1.3 cm previously. According to consensus criteria follow-up imaging in 6 months with repeat contrast enhanced pancreas protocol MRI or CT is advised. This recommendation follows ACR consensus guidelines: Management of Incidental Pancreatic Cysts: A White Paper of the ACR Incidental Findings Committee. J Am Coll Radiol 4665;99:357-017. 6. Tiny small nonspecific nodules within the right upper lobe are unchanged from previous exam.     06/12/2019 Tumor Marker   Patient's tumor was tested for the following markers: CA-125 Results of the tumor marker test revealed 148     REVIEW OF SYSTEMS:   Constitutional: Denies fevers, chills or abnormal weight loss Eyes: Denies blurriness of vision Ears, nose, mouth, throat, and face: Denies mucositis or sore throat Respiratory: Denies cough, dyspnea or wheezes Cardiovascular: Denies palpitation, chest discomfort or lower extremity swelling Gastrointestinal:  Denies nausea, heartburn or change in bowel habits Lymphatics: Denies new lymphadenopathy or easy bruising Neurological:Denies numbness, tingling or new weaknesses Behavioral/Psych: Mood is stable, no new changes  All other systems were reviewed with the patient and are negative.  I have reviewed the past medical history, past surgical history, social history and family history with the patient and they are unchanged from previous note.  ALLERGIES:  is allergic to codeine.  MEDICATIONS:  Current Outpatient Medications  Medication Sig Dispense Refill  . albuterol (PROAIR HFA) 108 (90 Base) MCG/ACT inhaler Inhale 2 puffs into  the lungs every 6 (six) hours as needed for  wheezing or shortness of breath. 18 g 5  . ALPRAZolam (XANAX) 0.25 MG tablet Take 1 tablet (0.25 mg total) by mouth at bedtime as needed for anxiety. 5 tablet 0  . azelastine (ASTELIN) 0.1 % nasal spray Place 2 sprays into both nostrils 2 (two) times daily as needed for allergies.     . Cholecalciferol (VITAMIN D) 2000 units CAPS Take 2,000 Units by mouth daily.     Marland Kitchen dicyclomine (BENTYL) 10 MG capsule TAKE 1 CAPSULE(10 MG) BY MOUTH TWICE DAILY 60 capsule 1  . diltiazem (CARDIZEM CD) 180 MG 24 hr capsule Take 1 capsule (180 mg total) by mouth 2 (two) times a day. 180 capsule 3  . diphenhydrAMINE (BENADRYL) 25 mg capsule Take 25 mg by mouth every 8 (eight) hours as needed for itching.     . DULoxetine (CYMBALTA) 60 MG capsule Take 1 capsule (60 mg total) by mouth at bedtime. 30 capsule 3  . fluticasone (FLONASE) 50 MCG/ACT nasal spray Place 2 sprays into both nostrils 2 (two) times daily as needed (FOR NASAL CONGESTION.).     Marland Kitchen furosemide (LASIX) 40 MG tablet Take 1 tablet (40 mg total) by mouth daily. 90 tablet 3  . gabapentin (NEURONTIN) 300 MG capsule Take 1 capsule (300 mg total) by mouth at bedtime. 30 capsule 0  . halobetasol (ULTRAVATE) 0.05 % cream Apply 1 application topically 2 (two) times daily as needed (psoriasis).     . hydrocortisone valerate cream (WESTCORT) 0.2 % Apply 1 application topically 2 (two) times daily. 45 g 0  . levothyroxine (SYNTHROID, LEVOTHROID) 137 MCG tablet Take 137 mcg by mouth daily before breakfast. For thyroid therapy    . Magnesium 400 MG CAPS Take 400 mg by mouth 2 (two) times a day.     . metoprolol tartrate (LOPRESSOR) 25 MG tablet Take 1 tablet (25 mg total) by mouth 2 (two) times daily as needed. 180 tablet 3  . mirtazapine (REMERON) 15 MG tablet Take 1 tablet (15 mg total) by mouth at bedtime. 30 tablet 9  . morphine (MS CONTIN) 15 MG 12 hr tablet Take 1 tablet (15 mg total) by mouth every 12 (twelve) hours. 60 tablet 0  . morphine (MSIR) 15 MG tablet  Take 1 tablet (15 mg total) by mouth every 4 (four) hours as needed for severe pain. 30 tablet 0  . polyvinyl alcohol (LIQUIFILM TEARS) 1.4 % ophthalmic solution Place 1 drop into both eyes 2 (two) times daily as needed for dry eyes.     . potassium chloride SA (K-DUR,KLOR-CON) 20 MEQ tablet TAKE 1 TABLET BY MOUTH DAILY AND TAKE 1 ADDITIONAL TABLET WHEN TAKING EXTRA LASIX DOSE (Patient taking differently: Take 20 mEq by mouth daily. ) 60 tablet 6  . predniSONE (DELTASONE) 10 MG tablet 5 tabs x 2 day, 4 tabs x 2 days, 3 tabs x 2 days, 2 tabs x 2 days, 1 tab x 2 days then resume previous dose 30 tablet 0  . predniSONE (DELTASONE) 5 MG tablet 6 tab x 2 day, 5 tab x 2 day, 4 tab x 2 day, 3 tab x 2 day, 2 tab x 2 day, 1 tab x 2 day, stop 42 tablet 0  . Rivaroxaban (XARELTO) 15 MG TABS tablet Take 1 tablet (15 mg total) by mouth daily with supper. 30 tablet 6  . Tiotropium Bromide-Olodaterol (STIOLTO RESPIMAT) 2.5-2.5 MCG/ACT AERS Inhale 2 puffs into the lungs daily. 4 g  11  . valACYclovir (VALTREX) 1000 MG tablet Take 1 tablet (1,000 mg total) by mouth 3 (three) times daily. 21 tablet 0   No current facility-administered medications for this visit.    Facility-Administered Medications Ordered in Other Visits  Medication Dose Route Frequency Provider Last Rate Last Dose  . 0.9 %  sodium chloride infusion (Manually program via Guardrails IV Fluids)  250 mL Intravenous Once Heath Lark, MD   Stopped at 12/16/18 1500  . heparin lock flush 100 unit/mL  500 Units Intracatheter Once PRN Alvy Bimler, Katryna Tschirhart, MD      . PACLitaxel (TAXOL) 126 mg in sodium chloride 0.9 % 250 mL chemo infusion (</= 43m/m2)  64 mg/m2 (Treatment Plan Recorded) Intravenous Once GHeath Lark MD 271 mL/hr at 06/26/19 1347 126 mg at 06/26/19 1347  . sodium chloride flush (NS) 0.9 % injection 10 mL  10 mL Intracatheter PRN GAlvy Bimler Johnjoseph Rolfe, MD        PHYSICAL EXAMINATION: ECOG PERFORMANCE STATUS: 2 - Symptomatic, <50% confined to bed  Vitals:    06/26/19 1106  BP: (!) 125/56  Pulse: 84  Resp: 17  Temp: 98.3 F (36.8 C)  SpO2: 99%   Filed Weights   06/26/19 1106  Weight: 176 lb 9.6 oz (80.1 kg)    GENERAL:alert, no distress and comfortable SKIN: She have persistent rash secondary to recent shingles but no blisters was seen EYES: normal, Conjunctiva are pink and non-injected, sclera clear OROPHARYNX:no exudate, no erythema and lips, buccal mucosa, and tongue normal  NECK: supple, thyroid normal size, non-tender, without nodularity LYMPH:  no palpable lymphadenopathy in the cervical, axillary or inguinal LUNGS: clear to auscultation and percussion with normal breathing effort HEART: regular rate & rhythm and no murmurs and no lower extremity edema ABDOMEN:abdomen soft, non-tender and normal bowel sounds Musculoskeletal:no cyanosis of digits and no clubbing  NEURO: alert & oriented x 3 with fluent speech, no focal motor/sensory deficits  LABORATORY DATA:  I have reviewed the data as listed    Component Value Date/Time   NA 137 06/26/2019 1023   NA 135 (L) 09/24/2017 0945   K 3.7 06/26/2019 1023   K 3.7 09/24/2017 0945   CL 94 (L) 06/26/2019 1023   CO2 33 (H) 06/26/2019 1023   CO2 30 (H) 09/24/2017 0945   GLUCOSE 144 (H) 06/26/2019 1023   GLUCOSE 101 09/24/2017 0945   BUN 16 06/26/2019 1023   BUN 15.3 09/24/2017 0945   CREATININE 0.99 06/26/2019 1023   CREATININE 1.11 (H) 06/24/2019 0848   CREATININE 0.8 09/24/2017 0945   CALCIUM 8.8 (L) 06/26/2019 1023   CALCIUM 9.1 09/24/2017 0945   PROT 6.6 06/26/2019 1023   PROT 6.6 09/24/2017 0945   ALBUMIN 3.1 (L) 06/26/2019 1023   ALBUMIN 3.3 (L) 09/24/2017 0945   AST 14 (L) 06/26/2019 1023   AST 18 06/24/2019 0848   AST 16 09/24/2017 0945   ALT 9 06/26/2019 1023   ALT 9 06/24/2019 0848   ALT 12 09/24/2017 0945   ALKPHOS 68 06/26/2019 1023   ALKPHOS 52 09/24/2017 0945   BILITOT 0.4 06/26/2019 1023   BILITOT 0.4 06/24/2019 0848   BILITOT 1.13 09/24/2017 0945    GFRNONAA 52 (L) 06/26/2019 1023   GFRNONAA 46 (L) 06/24/2019 0848   GFRAA >60 06/26/2019 1023   GFRAA 53 (L) 06/24/2019 0848    No results found for: SPEP, UPEP  Lab Results  Component Value Date   WBC 9.4 06/26/2019   NEUTROABS 8.4 (H) 06/26/2019  HGB 10.4 (L) 06/26/2019   HCT 34.0 (L) 06/26/2019   MCV 82.7 06/26/2019   PLT 334 06/26/2019      Chemistry      Component Value Date/Time   NA 137 06/26/2019 1023   NA 135 (L) 09/24/2017 0945   K 3.7 06/26/2019 1023   K 3.7 09/24/2017 0945   CL 94 (L) 06/26/2019 1023   CO2 33 (H) 06/26/2019 1023   CO2 30 (H) 09/24/2017 0945   BUN 16 06/26/2019 1023   BUN 15.3 09/24/2017 0945   CREATININE 0.99 06/26/2019 1023   CREATININE 1.11 (H) 06/24/2019 0848   CREATININE 0.8 09/24/2017 0945      Component Value Date/Time   CALCIUM 8.8 (L) 06/26/2019 1023   CALCIUM 9.1 09/24/2017 0945   ALKPHOS 68 06/26/2019 1023   ALKPHOS 52 09/24/2017 0945   AST 14 (L) 06/26/2019 1023   AST 18 06/24/2019 0848   AST 16 09/24/2017 0945   ALT 9 06/26/2019 1023   ALT 9 06/24/2019 0848   ALT 12 09/24/2017 0945   BILITOT 0.4 06/26/2019 1023   BILITOT 0.4 06/24/2019 0848   BILITOT 1.13 09/24/2017 0945       RADIOGRAPHIC STUDIES: I have personally reviewed the radiological images as listed and agreed with the findings in the report. 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 EXAM: CT CHEST, ABDOMEN, AND PELVIS WITH CONTRAST TECHNIQUE: Multidetector CT imaging of the chest, abdomen and pelvis was performed following the standard protocol during bolus administration of intravenous contrast. CONTRAST:  119m OMNIPAQUE IOHEXOL 300 MG/ML  SOLN COMPARISON:  CT chest 04/26/2019 and CT abdomen pelvis 02/09/2019. FINDINGS: CT CHEST FINDINGS Cardiovascular: The heart size is normal. Aortic atherosclerosis. Three vessel coronary artery calcifications. No pericardial  effusion. Mediastinum/Nodes: No enlarged mediastinal, hilar, or axillary lymph nodes. Thyroid gland, trachea, and esophagus demonstrate no significant findings. Lungs/Pleura: Trace left pleural effusion and small to moderate right pleural effusion are unchanged from 04/26/2019. Multiple small, less than 5 mm pulmonary nodules are identified within the right upper lobe. These are similar to the previous exam. Index nodule in the right upper lobe measures 3 mm, image 31/6. Musculoskeletal: Spondylosis identified within the thoracic spine. No aggressive lytic or sclerotic bone lesions identified. CT ABDOMEN PELVIS FINDINGS Hepatobiliary: No focal liver abnormality. Previous cholecystectomy. There is chronic increase caliber of the common bile duct which measures up to 11 mm. Pancreas: Unremarkable. There is a cystic lesion within the neck of pancreas which measures up to 1.7 cm, image 57/4. Previously this measured up to 1.3 cm. Spleen: There are scattered low attenuation foci within the spleen. Unchanged from previous exam. Adrenals/Urinary Tract: Normal appearance of the adrenal glands. No kidney mass or hydronephrosis. The urinary bladder appears normal. Stomach/Bowel: Dilatation of the small and large bowel loops are again identified. The small bowel loops measure up to 2.8 cm, similar to the previous exam. The distal small bowel loops are decreased in caliber suggesting the low-grade partial obstruction as mentioned previously. Vascular/Lymphatic: Aortic atherosclerosis. No abdominopelvic adenopathy. Reproductive: Previous hysterectomy. Decreased previously noted small volume ascites. No fluid collections identified. Mild peritoneal thickening in the pelvis and along the pericolic gutters noted, unchanged. Other: Ventral abdominal wall hernia containing nonobstructed loop of transverse colon. Unchanged from previous exam. Musculoskeletal: No acute or significant osseous findings. Previous ORIF of the left femur.  Multi level spondylosis identified within the lumbar spine. No aggressive lytic or sclerotic bone lesions. IMPRESSION:  1. Interval resolution of small volume ascites noted within the pelvis. Continued mild peritoneal thickening consistent with peritoneal carcinomatosis. 2. No new or progressive findings identified within the chest, abdomen or pelvis. 3. Partial small bowel obstruction described on 02/09/2019 is similar to previous exam. 4. Unchanged appearance of small pleural effusions, right greater than left. 5. Cystic lesion within neck of pancreas is identified, indeterminate measuring 1.7 cm. Increased from 1.3 cm previously. According to consensus criteria follow-up imaging in 6 months with repeat contrast enhanced pancreas protocol MRI or CT is advised. This recommendation follows ACR consensus guidelines: Management of Incidental Pancreatic Cysts: A White Paper of the ACR Incidental Findings Committee. J Am Coll Radiol 4034;74:259-563. 6. Tiny small nonspecific nodules within the right upper lobe are unchanged from previous exam. Electronically Signed   By: Kerby Moors M.D.   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 EXAM: CT CHEST, ABDOMEN, AND PELVIS WITH CONTRAST TECHNIQUE: Multidetector CT imaging of the chest, abdomen and pelvis was performed following the standard protocol during bolus administration of intravenous contrast. CONTRAST:  181m OMNIPAQUE IOHEXOL 300 MG/ML  SOLN COMPARISON:  CT chest 04/26/2019 and CT abdomen pelvis 02/09/2019. FINDINGS: CT CHEST FINDINGS Cardiovascular: The heart size is normal. Aortic atherosclerosis. Three vessel coronary artery calcifications. No pericardial effusion. Mediastinum/Nodes: No enlarged mediastinal, hilar, or axillary lymph nodes. Thyroid gland, trachea, and esophagus demonstrate no significant findings. Lungs/Pleura: Trace  left pleural effusion and small to moderate right pleural effusion are unchanged from 04/26/2019. Multiple small, less than 5 mm pulmonary nodules are identified within the right upper lobe. These are similar to the previous exam. Index nodule in the right upper lobe measures 3 mm, image 31/6. Musculoskeletal: Spondylosis identified within the thoracic spine. No aggressive lytic or sclerotic bone lesions identified. CT ABDOMEN PELVIS FINDINGS Hepatobiliary: No focal liver abnormality. Previous cholecystectomy. There is chronic increase caliber of the common bile duct which measures up to 11 mm. Pancreas: Unremarkable. There is a cystic lesion within the neck of pancreas which measures up to 1.7 cm, image 57/4. Previously this measured up to 1.3 cm. Spleen: There are scattered low attenuation foci within the spleen. Unchanged from previous exam. Adrenals/Urinary Tract: Normal appearance of the adrenal glands. No kidney mass or hydronephrosis. The urinary bladder appears normal. Stomach/Bowel: Dilatation of the small and large bowel loops are again identified. The small bowel loops measure up to 2.8 cm, similar to the previous exam. The distal small bowel loops are decreased in caliber suggesting the low-grade partial obstruction as mentioned previously. Vascular/Lymphatic: Aortic atherosclerosis. No abdominopelvic adenopathy. Reproductive: Previous hysterectomy. Decreased previously noted small volume ascites. No fluid collections identified. Mild peritoneal thickening in the pelvis and along the pericolic gutters noted, unchanged. Other: Ventral abdominal wall hernia containing nonobstructed loop of transverse colon. Unchanged from previous exam. Musculoskeletal: No acute or significant osseous findings. Previous ORIF of the left femur. Multi level spondylosis identified within the lumbar spine. No aggressive lytic or sclerotic bone lesions. IMPRESSION: 1. Interval resolution of small volume ascites noted within the  pelvis. Continued mild peritoneal thickening consistent with peritoneal carcinomatosis. 2. No new or progressive findings identified within the chest, abdomen or pelvis. 3. Partial small bowel obstruction described on 02/09/2019 is similar to previous exam. 4. Unchanged appearance of small pleural effusions, right greater than left. 5. Cystic lesion within neck of pancreas is identified, indeterminate measuring 1.7 cm. Increased from 1.3 cm  previously. According to consensus criteria follow-up imaging in 6 months with repeat contrast enhanced pancreas protocol MRI or CT is advised. This recommendation follows ACR consensus guidelines: Management of Incidental Pancreatic Cysts: A White Paper of the ACR Incidental Findings Committee. J Am Coll Radiol 7583;07:460-029. 6. Tiny small nonspecific nodules within the right upper lobe are unchanged from previous exam. Electronically Signed   By: Kerby Moors M.D.   On: 05/29/2019 15:13    All questions were answered. The patient knows to call the clinic with any problems, questions or concerns. No barriers to learning was detected.  I spent 25 minutes counseling the patient face to face. The total time spent in the appointment was 30 minutes and more than 50% was on counseling and review of test results  Heath Lark, MD 06/26/2019 2:32 PM

## 2019-06-26 NOTE — Patient Instructions (Signed)

## 2019-06-26 NOTE — Assessment & Plan Note (Signed)
I had numerous goals of care discussion with the patient and her daughter, [name] in the past Despite extremely poor quality of life, the patient wants to continue treatment She understood that treatment goal is palliative

## 2019-06-26 NOTE — Assessment & Plan Note (Signed)
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

## 2019-06-26 NOTE — Assessment & Plan Note (Signed)
Even though she has positive response to treatment, she had multiple side effects related to chemotherapy Despite poor quality of life, the patient wants to continue on treatment We will continue treatment every other week along with tumor marker monitoring during and CT every 3 months

## 2019-06-26 NOTE — Assessment & Plan Note (Signed)
She has severe postherpetic neuralgia She will continue on gabapentin I have recently refill her prescription pain medicine We will try to get her insurance to approve MS Contin due to severe uncontrolled pain I suspect her pain is worse due to ongoing chemotherapy Again, I had extensive discussion with the patient about potentially holding treatment to allow recovery but she declined

## 2019-06-27 LAB — THYROID PANEL WITH TSH
Free Thyroxine Index: 2.7 (ref 1.2–4.9)
T3 Uptake Ratio: 32 % (ref 24–39)
T4, Total: 8.3 ug/dL (ref 4.5–12.0)
TSH: 2.08 u[IU]/mL (ref 0.450–4.500)

## 2019-06-29 ENCOUNTER — Telehealth: Payer: Self-pay | Admitting: Hematology and Oncology

## 2019-06-29 NOTE — Telephone Encounter (Signed)
[facility] faxed Prior authorization request for Morphine Sulfate ER 15 mg ER tablets.  Request to Managed [employer] letter tray receptacle of Prior Authorization requests and forms for review.

## 2019-06-29 NOTE — Telephone Encounter (Signed)
I talk with [name] regarding schedule

## 2019-07-03 ENCOUNTER — Telehealth: Payer: Self-pay

## 2019-07-03 ENCOUNTER — Other Ambulatory Visit: Payer: Self-pay | Admitting: Hematology

## 2019-07-03 DIAGNOSIS — C562 Malignant neoplasm of left ovary: Secondary | ICD-10-CM

## 2019-07-03 MED ORDER — MORPHINE SULFATE 15 MG PO TABS: 15.0000 mg | ORAL_TABLET | ORAL | 0 refills | Status: DC | PRN

## 2019-07-03 NOTE — Telephone Encounter (Signed)
[name] called and left a message. Her Mom is having a lot pf pain from shingles that feels like muscle spasms. [name] left x 2 messages on symptom management earlier but no one has called her back. Then called this office.  Called [name] back. Sandi Mealy, PA is off today and symptom management is closed. Instructed to take Morphine IR every 4 hours around the clock and call the office Monday and give [place] a update. [name] verbalized understanding. Needs refill on Morphine. Dr. Maylon Peppers notified and will send refill.

## 2019-07-06 ENCOUNTER — Other Ambulatory Visit: Payer: Self-pay | Admitting: Internal Medicine

## 2019-07-08 ENCOUNTER — Encounter: Payer: Self-pay | Admitting: Primary Care

## 2019-07-08 ENCOUNTER — Telehealth: Payer: Self-pay | Admitting: Emergency Medicine

## 2019-07-08 ENCOUNTER — Telehealth (INDEPENDENT_AMBULATORY_CARE_PROVIDER_SITE_OTHER): Payer: Medicare Other | Admitting: Primary Care

## 2019-07-08 DIAGNOSIS — J441 Chronic obstructive pulmonary disease with (acute) exacerbation: Secondary | ICD-10-CM | POA: Diagnosis not present

## 2019-07-08 MED ORDER — GUAIFENESIN ER 600 MG PO TB12: 600.0000 mg | ORAL_TABLET | Freq: Two times a day (BID) | ORAL | 1 refills | Status: DC

## 2019-07-08 MED ORDER — DOXYCYCLINE HYCLATE 100 MG PO TABS: 100.0000 mg | ORAL_TABLET | Freq: Two times a day (BID) | ORAL | 0 refills | Status: DC

## 2019-07-08 NOTE — Telephone Encounter (Signed)
Spoke with pt's daughter, [name]. States that the pt has been having increased chest congestion and chest discomfort. [name] wants the pt to come in for a visit to be evaluated. Reece Packer that we are not allowing pts to come in the office at this time if they are "sick." Pt has been scheduled for a video visit with Beth this morning at 0930. Nothing further was needed.

## 2019-07-08 NOTE — Progress Notes (Signed)
Virtual Visit via Video Note  I connected with [name] on 07/08/19 at  9:30 AM EDT by a video enabled telemedicine application and verified that I am speaking with the correct person using two identifiers.  Location: Patient: Home Provider: Office   I discussed the limitations of evaluation and management by telemedicine and the availability of in person appointments. The patient expressed understanding and agreed to proceed.  History of Present Illness: 85 year old female, former smoker. PMH significant for COPD, chronic rhinitis, chronic cough, afib (on xarelto), ovarian cancer undergoing chemotherapy, right pleural effusion-pleurex cath removed May 2020 (cytology neg for malignant cells). Followed by thoracic surgery. Patient of Dr. Lamonte Sakai, last seen on 05/11/19. Stable during last visit. Ongoing dyspnea felt to be multifactorial. Maintained on Stiolto and uses 2L oxygen. CT in July showed moderate right pleural effusion and small left pleural effusion. Emphysematous changes. Monitor through serial scans/xrays. Holding off on further thoracentesis or Pleurx for now. Recommend follow-up in 3 months.   07/08/2019 Patient contacted today for virtual video visit. She is accompanied by her aid and daughter on phone. Reports increased chest congestion and productive cough x 3-4 days. States that she is getting mucus up but there isn't much color to it. She had an episode of shortness of breath this morning before taking her Stiolto inhaler. She is fine when sitting at rest. Recently treated with prednisone taper for shingles. Reports burning to bilateral lungs. Denies chest tightness or wheezing.   Observations/Objective:  Temp today 99.6.  Congested cough   TEST/EVENTS :  CT chest February 2019 decrease in right pleural effusion with resolution of right atelectasis, small left pleural effusion PET scan May 2019 no findings for metastatic disease in the chest, moderate size right pleural  effusion, small left pleural effusion, persistent hazy omental nodularity but no hypermetabolism or discrete measurable nodules.  No abdominal ascites.  PFTs October 2019 showed FEV1 52%, ratio 54, FVC 72%, no significant bronchodilator response  CT abdomen and pelvis Feb 09, 2019, new mild ascites, mild peritoneal thickening with no significant change consistent with peritoneal carcinomatosis.  Increase tiny bilateral pleural effusions.  CT chest April 26, 2019 no evidence of right pneumothorax or left apical nodules.  Moderate right pleural effusion, small left pleural effusion with associated passive atelectasis of the lower lobes.  Moderate bronchiectasis.  Minimal perihepatic ascites.  Assessment and Plan:  COPD exacerbation - Congested/productive cough  - Rx doxycycline 1 tab twice daily x 7 days - Advised mucinex twice daily for chest congestion  - Needs CXR at [facility] - Patient declined covid testing- low suspicion   Recurrent pleural effusion - No significant shortness of breath, low grade fever  - Monitor with chest xray  Follow Up Instructions:   - Return or call if symptoms do not improve or worsen  I discussed the assessment and treatment plan with the patient. The patient was provided an opportunity to ask questions and all were answered. The patient agreed with the plan and demonstrated an understanding of the instructions.   The patient was advised to call back or seek an in-person evaluation if the symptoms worsen or if the condition fails to improve as anticipated.  I provided 20 minutes of non-face-to-face time during this encounter.   Martyn Ehrich, NP

## 2019-07-08 NOTE — Patient Instructions (Signed)
Rx: Doxycycline 1 tab twice daily x 7 days Mucinex twice daily for chest congestion  Recommendations: Continue stiolto daily Use rescue inhaler 2 puffs every 4-6 hours as needed for shortness of breath Monitor temperature and report if >100  Orders: CXR at [facility] (ordered)  Follow-up Due to see Dr. Lamonte Sakai in  4-6 weeks Return/call if symptoms do not improve or worsen

## 2019-07-09 ENCOUNTER — Other Ambulatory Visit: Payer: Self-pay

## 2019-07-09 ENCOUNTER — Emergency Department (HOSPITAL_COMMUNITY): Payer: Medicare Other

## 2019-07-09 ENCOUNTER — Inpatient Hospital Stay (HOSPITAL_COMMUNITY)
Admission: EM | Admit: 2019-07-09 | Discharge: 2019-07-12 | DRG: 194 | Disposition: A | Discharge status: Home Health | Payer: Medicare Other | Attending: Internal Medicine | Admitting: Internal Medicine

## 2019-07-09 ENCOUNTER — Encounter (HOSPITAL_COMMUNITY): Payer: Self-pay | Admitting: Emergency Medicine

## 2019-07-09 DIAGNOSIS — C562 Malignant neoplasm of left ovary: Secondary | ICD-10-CM | POA: Diagnosis present

## 2019-07-09 DIAGNOSIS — B0229 Other postherpetic nervous system involvement: Secondary | ICD-10-CM | POA: Diagnosis not present

## 2019-07-09 DIAGNOSIS — Z96652 Presence of left artificial knee joint: Secondary | ICD-10-CM | POA: Diagnosis present

## 2019-07-09 DIAGNOSIS — I4891 Unspecified atrial fibrillation: Secondary | ICD-10-CM | POA: Diagnosis present

## 2019-07-09 DIAGNOSIS — I5032 Chronic diastolic (congestive) heart failure: Secondary | ICD-10-CM | POA: Diagnosis not present

## 2019-07-09 DIAGNOSIS — J181 Lobar pneumonia, unspecified organism: Secondary | ICD-10-CM | POA: Diagnosis not present

## 2019-07-09 DIAGNOSIS — J189 Pneumonia, unspecified organism: Secondary | ICD-10-CM | POA: Diagnosis not present

## 2019-07-09 DIAGNOSIS — I480 Paroxysmal atrial fibrillation: Secondary | ICD-10-CM | POA: Diagnosis present

## 2019-07-09 DIAGNOSIS — Z9981 Dependence on supplemental oxygen: Secondary | ICD-10-CM

## 2019-07-09 DIAGNOSIS — J9611 Chronic respiratory failure with hypoxia: Secondary | ICD-10-CM | POA: Diagnosis not present

## 2019-07-09 DIAGNOSIS — F329 Major depressive disorder, single episode, unspecified: Secondary | ICD-10-CM | POA: Diagnosis present

## 2019-07-09 DIAGNOSIS — Z79899 Other long term (current) drug therapy: Secondary | ICD-10-CM

## 2019-07-09 DIAGNOSIS — M810 Age-related osteoporosis without current pathological fracture: Secondary | ICD-10-CM | POA: Diagnosis present

## 2019-07-09 DIAGNOSIS — J302 Other seasonal allergic rhinitis: Secondary | ICD-10-CM | POA: Diagnosis present

## 2019-07-09 DIAGNOSIS — Z9221 Personal history of antineoplastic chemotherapy: Secondary | ICD-10-CM

## 2019-07-09 DIAGNOSIS — E039 Hypothyroidism, unspecified: Secondary | ICD-10-CM | POA: Diagnosis present

## 2019-07-09 DIAGNOSIS — Z801 Family history of malignant neoplasm of trachea, bronchus and lung: Secondary | ICD-10-CM | POA: Diagnosis not present

## 2019-07-09 DIAGNOSIS — Z8249 Family history of ischemic heart disease and other diseases of the circulatory system: Secondary | ICD-10-CM | POA: Diagnosis not present

## 2019-07-09 DIAGNOSIS — Z87891 Personal history of nicotine dependence: Secondary | ICD-10-CM | POA: Diagnosis not present

## 2019-07-09 DIAGNOSIS — J440 Chronic obstructive pulmonary disease with acute lower respiratory infection: Secondary | ICD-10-CM | POA: Diagnosis present

## 2019-07-09 DIAGNOSIS — J449 Chronic obstructive pulmonary disease, unspecified: Secondary | ICD-10-CM | POA: Diagnosis present

## 2019-07-09 DIAGNOSIS — Z8719 Personal history of other diseases of the digestive system: Secondary | ICD-10-CM

## 2019-07-09 DIAGNOSIS — I4892 Unspecified atrial flutter: Secondary | ICD-10-CM | POA: Diagnosis present

## 2019-07-09 DIAGNOSIS — K219 Gastro-esophageal reflux disease without esophagitis: Secondary | ICD-10-CM | POA: Diagnosis present

## 2019-07-09 DIAGNOSIS — Z800 Family history of malignant neoplasm of digestive organs: Secondary | ICD-10-CM

## 2019-07-09 DIAGNOSIS — Z806 Family history of leukemia: Secondary | ICD-10-CM

## 2019-07-09 DIAGNOSIS — Z7901 Long term (current) use of anticoagulants: Secondary | ICD-10-CM

## 2019-07-09 DIAGNOSIS — Z20828 Contact with and (suspected) exposure to other viral communicable diseases: Secondary | ICD-10-CM | POA: Diagnosis not present

## 2019-07-09 DIAGNOSIS — J441 Chronic obstructive pulmonary disease with (acute) exacerbation: Secondary | ICD-10-CM | POA: Diagnosis not present

## 2019-07-09 DIAGNOSIS — I459 Conduction disorder, unspecified: Secondary | ICD-10-CM | POA: Diagnosis present

## 2019-07-09 DIAGNOSIS — D638 Anemia in other chronic diseases classified elsewhere: Secondary | ICD-10-CM | POA: Diagnosis present

## 2019-07-09 DIAGNOSIS — Z8619 Personal history of other infectious and parasitic diseases: Secondary | ICD-10-CM

## 2019-07-09 DIAGNOSIS — Z79890 Hormone replacement therapy (postmenopausal): Secondary | ICD-10-CM

## 2019-07-09 DIAGNOSIS — M797 Fibromyalgia: Secondary | ICD-10-CM | POA: Diagnosis present

## 2019-07-09 LAB — COMPREHENSIVE METABOLIC PANEL
ALT: 15 U/L (ref 0–44)
AST: 13 U/L — ABNORMAL LOW (ref 15–41)
Albumin: 3 g/dL — ABNORMAL LOW (ref 3.5–5.0)
Alkaline Phosphatase: 56 U/L (ref 38–126)
Anion gap: 9 (ref 5–15)
BUN: 21 mg/dL (ref 8–23)
CO2: 31 mmol/L (ref 22–32)
Calcium: 8.4 mg/dL — ABNORMAL LOW (ref 8.9–10.3)
Chloride: 91 mmol/L — ABNORMAL LOW (ref 98–111)
Creatinine, Ser: 0.99 mg/dL (ref 0.44–1.00)
GFR calc Af Amer: >60 mL/min (ref >60)
GFR calc non Af Amer: 52 mL/min — ABNORMAL LOW (ref >60)
Glucose, Bld: 110 mg/dL — ABNORMAL HIGH (ref 70–99)
Potassium: 4 mmol/L (ref 3.5–5.1)
Sodium: 131 mmol/L — ABNORMAL LOW (ref 135–145)
Total Bilirubin: 0.9 mg/dL (ref 0.3–1.2)
Total Protein: 6 g/dL — ABNORMAL LOW (ref 6.5–8.1)

## 2019-07-09 LAB — CBC WITH DIFFERENTIAL/PLATELET
Abs Immature Granulocytes: 0.19 10*3/uL — ABNORMAL HIGH (ref 0.00–0.07)
Basophils Absolute: 0.1 10*3/uL (ref 0.0–0.1)
Basophils Relative: 0 %
Eosinophils Absolute: 0.1 10*3/uL (ref 0.0–0.5)
Eosinophils Relative: 1 %
HCT: 29.4 % — ABNORMAL LOW (ref 36.0–46.0)
Hemoglobin: 8.7 g/dL — ABNORMAL LOW (ref 12.0–15.0)
Immature Granulocytes: 1 %
Lymphocytes Relative: 8 %
Lymphs Abs: 1.4 10*3/uL (ref 0.7–4.0)
MCH: 25 pg — ABNORMAL LOW (ref 26.0–34.0)
MCHC: 29.6 g/dL — ABNORMAL LOW (ref 30.0–36.0)
MCV: 84.5 fL (ref 80.0–100.0)
Monocytes Absolute: 1.7 10*3/uL — ABNORMAL HIGH (ref 0.1–1.0)
Monocytes Relative: 10 %
Neutro Abs: 14.1 10*3/uL — ABNORMAL HIGH (ref 1.7–7.7)
Neutrophils Relative %: 80 %
Platelets: 256 10*3/uL (ref 150–400)
RBC: 3.48 MIL/uL — ABNORMAL LOW (ref 3.87–5.11)
RDW: 20.2 % — ABNORMAL HIGH (ref 11.5–15.5)
WBC: 17.6 10*3/uL — ABNORMAL HIGH (ref 4.0–10.5)
nRBC: 0 % (ref 0.0–0.2)

## 2019-07-09 LAB — SARS CORONAVIRUS 2 BY RT PCR (HOSPITAL ORDER, PERFORMED IN ~~LOC~~ HOSPITAL LAB): SARS Coronavirus 2: NEGATIVE

## 2019-07-09 MED ORDER — GUAIFENESIN ER 600 MG PO TB12
600.0000 mg | ORAL_TABLET | Freq: Two times a day (BID) | ORAL | Status: DC
  Administered 2019-07-09 – 2019-07-12 (×6): 600 mg via ORAL
  Filled 2019-07-09 (×6): qty 1

## 2019-07-09 MED ORDER — SODIUM CHLORIDE 0.9 % IV SOLN
1.0000 g | Freq: Once | INTRAVENOUS | Status: AC
  Administered 2019-07-09: 1 g via INTRAVENOUS
  Filled 2019-07-09: qty 10

## 2019-07-09 MED ORDER — SODIUM CHLORIDE 0.9 % IV SOLN
250.0000 mL | INTRAVENOUS | Status: DC | PRN
  Administered 2019-07-12: 500 mL via INTRAVENOUS

## 2019-07-09 MED ORDER — ALBUTEROL SULFATE (2.5 MG/3ML) 0.083% IN NEBU
2.5000 mg | INHALATION_SOLUTION | Freq: Four times a day (QID) | RESPIRATORY_TRACT | Status: DC | PRN
  Administered 2019-07-11: 15:00:00 2.5 mg via RESPIRATORY_TRACT
  Filled 2019-07-09: qty 3

## 2019-07-09 MED ORDER — ALBUTEROL SULFATE HFA 108 (90 BASE) MCG/ACT IN AERS
2.0000 | INHALATION_SPRAY | Freq: Once | RESPIRATORY_TRACT | Status: AC
  Administered 2019-07-09: 14:00:00 2 via RESPIRATORY_TRACT
  Filled 2019-07-09: qty 6.7

## 2019-07-09 MED ORDER — AZELASTINE HCL 0.1 % NA SOLN: 2.0000 | Freq: Two times a day (BID) | NASAL | Status: DC | PRN

## 2019-07-09 MED ORDER — FUROSEMIDE 40 MG PO TABS
40.0000 mg | ORAL_TABLET | Freq: Every day | ORAL | Status: DC
  Administered 2019-07-10 – 2019-07-12 (×3): 40 mg via ORAL
  Filled 2019-07-09 (×3): qty 1

## 2019-07-09 MED ORDER — DULOXETINE HCL 60 MG PO CPEP
60.0000 mg | ORAL_CAPSULE | Freq: Every day | ORAL | Status: DC
  Administered 2019-07-09 – 2019-07-11 (×3): 60 mg via ORAL
  Filled 2019-07-09 (×3): qty 1

## 2019-07-09 MED ORDER — SODIUM CHLORIDE 0.9% FLUSH: 3.0000 mL | INTRAVENOUS | Status: DC | PRN

## 2019-07-09 MED ORDER — SODIUM CHLORIDE 0.9 % IV SOLN
500.0000 mg | Freq: Once | INTRAVENOUS | Status: AC
  Administered 2019-07-09: 500 mg via INTRAVENOUS
  Filled 2019-07-09: qty 500

## 2019-07-09 MED ORDER — SODIUM CHLORIDE 0.9% FLUSH: 3.0000 mL | Freq: Two times a day (BID) | INTRAVENOUS | Status: DC

## 2019-07-09 MED ORDER — UMECLIDINIUM BROMIDE 62.5 MCG/INH IN AEPB: 1.0000 | INHALATION_SPRAY | Freq: Every day | RESPIRATORY_TRACT | Status: DC

## 2019-07-09 MED ORDER — ENOXAPARIN SODIUM 40 MG/0.4ML ~~LOC~~ SOLN: 40.0000 mg | SUBCUTANEOUS | Status: DC

## 2019-07-09 MED ORDER — FLUTICASONE PROPIONATE 50 MCG/ACT NA SUSP: 2.0000 | Freq: Two times a day (BID) | NASAL | Status: DC | PRN

## 2019-07-09 MED ORDER — ALPRAZOLAM 0.25 MG PO TABS: 0.2500 mg | ORAL_TABLET | Freq: Every evening | ORAL | Status: DC | PRN

## 2019-07-09 MED ORDER — MIRTAZAPINE 15 MG PO TABS
15.0000 mg | ORAL_TABLET | Freq: Every day | ORAL | Status: DC
  Administered 2019-07-09 – 2019-07-11 (×3): 15 mg via ORAL
  Filled 2019-07-09 (×3): qty 1

## 2019-07-09 MED ORDER — POTASSIUM CHLORIDE CRYS ER 20 MEQ PO TBCR
20.0000 meq | EXTENDED_RELEASE_TABLET | Freq: Every day | ORAL | Status: DC
  Administered 2019-07-10 – 2019-07-12 (×3): 20 meq via ORAL
  Filled 2019-07-09 (×3): qty 1

## 2019-07-09 MED ORDER — CHLORHEXIDINE GLUCONATE CLOTH 2 % EX PADS
6.0000 | MEDICATED_PAD | Freq: Every day | CUTANEOUS | Status: DC
  Administered 2019-07-09 – 2019-07-12 (×4): 6 via TOPICAL

## 2019-07-09 MED ORDER — DILTIAZEM HCL ER COATED BEADS 180 MG PO CP24
180.0000 mg | ORAL_CAPSULE | Freq: Two times a day (BID) | ORAL | Status: DC
  Administered 2019-07-09 – 2019-07-12 (×6): 180 mg via ORAL
  Filled 2019-07-09 (×6): qty 1

## 2019-07-09 MED ORDER — POLYVINYL ALCOHOL 1.4 % OP SOLN: 1.0000 [drp] | Freq: Two times a day (BID) | OPHTHALMIC | Status: DC | PRN

## 2019-07-09 MED ORDER — SODIUM CHLORIDE 0.9 % IV SOLN
500.0000 mg | INTRAVENOUS | Status: DC
  Administered 2019-07-10 – 2019-07-11 (×2): 500 mg via INTRAVENOUS
  Filled 2019-07-09 (×3): qty 500

## 2019-07-09 MED ORDER — GABAPENTIN 300 MG PO CAPS
300.0000 mg | ORAL_CAPSULE | Freq: Every day | ORAL | Status: DC
  Administered 2019-07-09 – 2019-07-10 (×2): 300 mg via ORAL
  Filled 2019-07-09 (×2): qty 1

## 2019-07-09 MED ORDER — LEVOTHYROXINE SODIUM 25 MCG PO TABS
137.0000 ug | ORAL_TABLET | Freq: Every day | ORAL | Status: DC
  Administered 2019-07-10 – 2019-07-12 (×3): 137 ug via ORAL
  Filled 2019-07-09 (×3): qty 1

## 2019-07-09 MED ORDER — SODIUM CHLORIDE 0.9% FLUSH
3.0000 mL | Freq: Two times a day (BID) | INTRAVENOUS | Status: DC
  Administered 2019-07-10: 10 mL via INTRAVENOUS

## 2019-07-09 MED ORDER — PREDNISONE 20 MG PO TABS
40.0000 mg | ORAL_TABLET | Freq: Every day | ORAL | Status: DC
  Administered 2019-07-10 – 2019-07-12 (×3): 40 mg via ORAL
  Filled 2019-07-09 (×3): qty 2

## 2019-07-09 MED ORDER — DICYCLOMINE HCL 10 MG PO CAPS
10.0000 mg | ORAL_CAPSULE | Freq: Two times a day (BID) | ORAL | Status: DC
  Administered 2019-07-09 – 2019-07-12 (×6): 10 mg via ORAL
  Filled 2019-07-09 (×6): qty 1

## 2019-07-09 MED ORDER — GUAIFENESIN ER 600 MG PO TB12
600.0000 mg | ORAL_TABLET | Freq: Two times a day (BID) | ORAL | Status: DC
  Administered 2019-07-09: 14:00:00 600 mg via ORAL
  Filled 2019-07-09: qty 1

## 2019-07-09 MED ORDER — ACETAMINOPHEN 325 MG PO TABS
650.0000 mg | ORAL_TABLET | Freq: Four times a day (QID) | ORAL | Status: DC | PRN
  Administered 2019-07-09 – 2019-07-10 (×3): 650 mg via ORAL
  Filled 2019-07-09 (×4): qty 2

## 2019-07-09 MED ORDER — RIVAROXABAN 15 MG PO TABS
15.0000 mg | ORAL_TABLET | Freq: Every day | ORAL | Status: DC
  Administered 2019-07-10 – 2019-07-12 (×3): 15 mg via ORAL
  Filled 2019-07-09 (×3): qty 1

## 2019-07-09 MED ORDER — SODIUM CHLORIDE 0.9 % IV SOLN
2.0000 g | INTRAVENOUS | Status: DC
  Administered 2019-07-10 – 2019-07-11 (×2): 2 g via INTRAVENOUS
  Filled 2019-07-09 (×2): qty 20
  Filled 2019-07-09: qty 2

## 2019-07-09 MED ORDER — ACETAMINOPHEN 650 MG RE SUPP: 650.0000 mg | Freq: Four times a day (QID) | RECTAL | Status: DC | PRN

## 2019-07-09 NOTE — ED Notes (Addendum)
Transport called to take pt upstairs 

## 2019-07-09 NOTE — ED Provider Notes (Signed)
[facility] DEPT Provider Note   CSN: [id number] Arrival date & time: 07/09/19  1013     History   Chief Complaint Chief Complaint  Patient presents with  . Fatigue  . Altered Mental Status  . Nasal Congestion    HPI [name] is a 85 y.o. female.     The history is provided by the patient, a relative and medical records. No language interpreter was used.  Altered Mental Status  [name] is a 85 y.o. female who presents to the Emergency Department complaining of cough, congestion.  She complains of increased sob and cough, productive of yellow sputum that started over the last few days.  Denies fevers.  Had a temp to 99.  Denies CP, AP, N/V/D, black/bloody stools.   Has occasional leg swelling - unchanged from baseline.  No covid 19 exposures.  She had a virtual visit with pulmonary yesterday and was started on doxycycline bid.  She has taken 3 doses.  Sxs are moderate, constant, worsening.  She is accompanied by her daughter. She lives at home alone but has a caregiver that checks on her for five hours a day.  Daughter reports worsening confusion over the last few weeks.    She is on 2 L [place] at baseline.   Past Medical History:  Diagnosis Date  . Anxiety   . Chronic blood loss anemia    03-04-2018 diverticular bleed and rectal bleeding,  transfused 2 units PRBCs 03-08-2018  . Colitis   . COPD (chronic obstructive pulmonary disease) (HCC)    Dr. Lamonte Sakai  . Depression   . Diastolic CHF, chronic ([place])   . Diverticulosis   . Family history of colon cancer   . Fibromyalgia   . Genetic testing 04/07/2018   MyRisk (35 genes) @ [facility] - No pathogenic mutations detected  . GERD (gastroesophageal reflux disease)   . Hemorrhoids   . Hiatal hernia   . Hip pain 07/2018   Right Hip Pain  . History of rectal polyps   . History of shingles   . Hypothyroidism   . IBS (irritable bowel syndrome)   . Lymphocytic colitis    Dr. Henrene Pastor  .  Malignant ascites    Admission 06/2017 abdominal s/p parencentesis 07-01-2017 2.5L, 07-08-2016  2.7L, 07-12-2017  1464m  . Neuropathy due to chemotherapeutic drug ([facility]   . On home O2   . Osteoporosis   . Ovarian cancer ([facility]    Chemotherapy - Dr. GAlvy Bimler . Paroxysmal atrial fibrillation ([facility]    Xarelto stopped 03-07-2018 due to lower GI bleed  . Pleural effusion    s/p  right thoracentesis, 02-2018 1.3L and 03-17-2018 right thoracentesis 6472m, post cxr no residual effusion  . Psoriatic arthritis ([facility]  . Schatzki's ring 2013  . Seasonal allergic rhinitis     Patient Active Problem List   Diagnosis Date Noted  . Post herpetic neuralgia 06/26/2019  . Herpes zoster 06/05/2019  . Chronic respiratory failure with hypoxia ([facility]08/12/2018  . Symptomatic anemia 04/26/2019  . UTI (urinary tract infection) 12/29/2018  . Mucositis due to chemotherapy 12/09/2018  . CKD (chronic kidney disease) stage 3, GFR 30-59 ml/min 10/14/2018  . Chronic atrial fibrillation ([facility]01/04/2019  . Hypomagnesemia 09/22/2018  . Cancer associated pain 09/12/2018  . Goals of care, counseling/discussion 09/12/2018  . Other constipation 05/28/2018  . Psoriatic arthritis ([facility]07/16/2019  . Genetic testing 04/07/2018  . Family history of colon cancer   .  History of GI diverticular bleed 03/12/2018  . Lower GI bleed   . Diverticulosis of colon with hemorrhage   . Rectal bleeding 03/04/2018  . Acute GI bleeding 03/04/2018  . Thrombocytopenia ([place]) 01/03/2018  . Anemia, chronic disease 12/13/2017  . Mild protein-calorie malnutrition ([place]) 12/13/2017  . Peripheral neuropathy due to chemotherapy ([place]) 12/13/2017  . S/P thoracentesis   . Atrial fibrillation with RVR ([place]) 11/08/2017  . HCAP (healthcare-associated pneumonia) 11/08/2017  . Pleural effusion 09/20/2017  . Acute on chronic diastolic heart failure ([place]) 08/19/2017  . Pancytopenia, acquired ([place]) 08/15/2017  . Protein-calorie malnutrition,  moderate ([place]) 07/30/2017  . Generalized weakness 07/30/2017  . Antineoplastic chemotherapy induced pancytopenia ([place]) 07/20/2017  . Left ovarian epithelial cancer ([place]) 07/18/2017  . PNA (pneumonia) 07/07/2017  . Ascites 06/30/2017  . Ascites, malignant 06/30/2017  . Elevated CA-125 01/18/2017  . Depression 12/23/2015  . Peripheral edema 08/23/2015  . Fracture of hip, left, closed ([place]) 12/08/2013  . Hip fracture ([place]) 12/08/2013  . PAD (peripheral artery disease) ([place]) 11/25/2013  . Encounter for therapeutic drug monitoring 11/09/2013  . Cough 03/20/2013  . Total knee replacement status 10/07/2012  . Knee pain 10/07/2012  . Knee stiffness 10/07/2012  . Tachycardia 09/17/2012  . Chest pain 09/17/2012  . Difficulty in walking(719.7) 09/16/2012  . Muscle weakness (generalized) 09/16/2012  . Postop Acute blood loss anemia 08/08/2012  . Instability of prosthetic knee ([place]) 08/06/2012  . Bloating 02/14/2012  . Upper abdominal pain 02/14/2012  . Allergic rhinitis, seasonal 02/15/2011  . COLITIS 02/20/2010  . Diarrhea 01/02/2010  . ABDOMINAL PAIN -GENERALIZED 01/02/2010  . PERSONAL HX COLONIC POLYPS 01/02/2010  . Hypothyroidism 12/28/2009  . COPD (chronic obstructive pulmonary disease) ([place]) 12/28/2009  . Arthropathy 12/28/2009    Past Surgical History:  Procedure Laterality Date  . CARDIOVASCULAR STRESS TEST  09/23/2012   Low risk lexiscan nuclear study w/ apical thinning but no evidence of ischemia/  normal LV function and wall motion , ef 75%  . CATARACT EXTRACTION W/ INTRAOCULAR LENS  IMPLANT, BILATERAL  10/2016  . CHEST TUBE INSERTION Right 06/24/2018   Procedure: INSERTION PLEURAL DRAINAGE CATHETER;  Surgeon: Grace Isaac, MD;  Location: [facility];  Service: Thoracic;  Laterality: Right;  . COLONOSCOPY    . DEBULKING N/A 03/20/2018   Procedure: DEBULKING;  Surgeon: Isabel Caprice, MD;  Location: [facility];  Service: Gynecology;  Laterality: N/A;  . EXAM UNDER ANESTHESIA  WITH MANIPULATION OF KNEE Left 12-20-2003  dr Noemi Chapel   post TKA  . FEMUR IM NAIL Left 12/11/2013   Procedure: INTRAMEDULLARY (IM) NAIL FEMORAL;  Surgeon: [name], MD;  Location: [facility];  Service: Orthopedics;  Laterality: Left;  . HYSTERECTOMY ABDOMINAL WITH SALPINGO-OOPHORECTOMY Bilateral 03/20/2018   Procedure: TOTAL HYSTERECTOMY ABDOMINAL WITH BILATERAL  SALPINGO-OOPHORECTOMY;  Surgeon: Isabel Caprice, MD;  Location: [facility];  Service: Gynecology;  Laterality: Bilateral;  . IR FLUORO GUIDE PORT INSERTION RIGHT  07/22/2017  . IR PARACENTESIS  07/12/2017  . IR THORACENTESIS ASP PLEURAL SPACE W/IMG GUIDE  03/17/2018  . IR THORACENTESIS ASP PLEURAL SPACE W/IMG GUIDE  05/08/2018  . IR US GUIDE VASC ACCESS RIGHT  07/22/2017  . KNEE ARTHROSCOPY W/ LATERAL RELEASE Left 09-03-2005   dr Noemi Chapel  [facility]   w/  Lysis Adhesions,  excision loose body's  . LAPAROSCOPIC CHOLECYSTECTOMY  12-04-2010  dr zeigler  . LAPAROTOMY N/A 03/20/2018   Procedure: EXPLORATORY LAPAROTOMY;  Surgeon: Isabel Caprice, MD;  Location: [facility];  Service: Gynecology;  Laterality: N/A;  . OMENTECTOMY N/A 03/20/2018   Procedure: OMENTECTOMY;  Surgeon: Isabel Caprice, MD;  Location[facility];  Service: Gynecology;  Laterality: N/A;  . OTHER SURGICAL HISTORY  06/24/2018   Plurex Catheter inserted into the lungs  . TOTAL KNEE ARTHROPLASTY Left 09-08-2003   dr Noemi Chapel  [facility]  . TOTAL KNEE REVISION  08/06/2012   Procedure: TOTAL KNEE REVISION;  Surgeon: [name], MD;  Location[facility];  Service: Orthopedics;  Laterality: Left;  Left Total Knee Arthroplasty Revision  . TRANSTHORACIC ECHOCARDIOGRAM  08/20/2017   ef 60-65%,  grade 1 diastolic dysfunction/  trivial AR and TR  . Uterine polypectomy       OB History   No obstetric history on file.      Home Medications    Prior to Admission medications   Medication Sig Start Date End Date Taking? Authorizing Provider  albuterol (PROAIR HFA) 108 (90 Base) MCG/ACT inhaler  Inhale 2 puffs into the lungs every 6 (six) hours as needed for wheezing or shortness of breath. 04/01/19   Byrum, Rose Fillers, MD  ALPRAZolam Duanne Moron) 0.25 MG tablet Take 1 tablet (0.25 mg total) by mouth at bedtime as needed for anxiety. 11/15/17   Amin, Jeanella Flattery, MD  azelastine (ASTELIN) 0.1 % nasal spray Place 2 sprays into both nostrils 2 (two) times daily as needed for allergies.     [provider]  Cholecalciferol (VITAMIN D) 2000 units CAPS Take 2,000 Units by mouth daily.     [provider]  dicyclomine (BENTYL) 10 MG capsule TAKE 1 CAPSULE(10 MG) BY MOUTH TWICE DAILY 07/07/19   [name], MD  diltiazem (CARDIZEM CD) 180 MG 24 hr capsule Take 1 capsule (180 mg total) by mouth 2 (two) times a day. 04/28/19   Satira Sark, MD  diphenhydrAMINE (BENADRYL) 25 mg capsule Take 25 mg by mouth every 8 (eight) hours as needed for itching.     [provider]  doxycycline (VIBRA-TABS) 100 MG tablet Take 1 tablet (100 mg total) by mouth 2 (two) times daily. 07/08/19   Martyn Ehrich, NP  DULoxetine (CYMBALTA) 60 MG capsule Take 1 capsule (60 mg total) by mouth at bedtime. 05/08/19   Heath Lark, MD  fluticasone (FLONASE) 50 MCG/ACT nasal spray Place 2 sprays into both nostrils 2 (two) times daily as needed (FOR NASAL CONGESTION.).  11/20/13   [provider]  furosemide (LASIX) 40 MG tablet Take 1 tablet (40 mg total) by mouth daily. 10/13/18   Satira Sark, MD  gabapentin (NEURONTIN) 300 MG capsule Take 1 capsule (300 mg total) by mouth at bedtime. 06/26/19 07/26/19  Heath Lark, MD  guaiFENesin (MUCINEX) 600 MG 12 hr tablet Take 1 tablet (600 mg total) by mouth 2 (two) times daily. 07/08/19   Martyn Ehrich, NP  halobetasol (ULTRAVATE) 0.05 % cream Apply 1 application topically 2 (two) times daily as needed (psoriasis).     [provider]  hydrocortisone valerate cream (WESTCORT) 0.2 % Apply 1 application topically 2 (two) times daily. 06/25/19    Tanner, Lyndon Code., PA-C  levothyroxine (SYNTHROID, LEVOTHROID) 137 MCG tablet Take 137 mcg by mouth daily before breakfast. For thyroid therapy    [provider]  Magnesium 400 MG CAPS Take 400 mg by mouth 2 (two) times a day.     [provider]  metoprolol tartrate (LOPRESSOR) 25 MG tablet Take 1 tablet (25 mg total) by mouth 2 (two) times daily as  needed. 04/20/19 07/19/19  Imogene Burn, PA-C  mirtazapine (REMERON) 15 MG tablet Take 1 tablet (15 mg total) by mouth at bedtime. 04/08/19   Heath Lark, MD  morphine (MS CONTIN) 15 MG 12 hr tablet Take 1 tablet (15 mg total) by mouth every 12 (twelve) hours. 06/24/19   Heath Lark, MD  morphine (MSIR) 15 MG tablet Take 1 tablet (15 mg total) by mouth every 4 (four) hours as needed for severe pain. 07/03/19   Tish Men, MD  polyvinyl alcohol (LIQUIFILM TEARS) 1.4 % ophthalmic solution Place 1 drop into both eyes 2 (two) times daily as needed for dry eyes.     [provider]  potassium chloride SA (K-DUR,KLOR-CON) 20 MEQ tablet TAKE 1 TABLET BY MOUTH DAILY AND TAKE 1 ADDITIONAL TABLET WHEN TAKING EXTRA LASIX DOSE Patient taking differently: Take 20 mEq by mouth daily.  11/10/18   Satira Sark, MD  Rivaroxaban (XARELTO) 15 MG TABS tablet Take 15 mg by mouth daily.    [provider]  Tiotropium Bromide-Olodaterol (STIOLTO RESPIMAT) 2.5-2.5 MCG/ACT AERS Inhale 2 puffs into the lungs daily. 06/08/19   Collene Gobble, MD  valACYclovir (VALTREX) 1000 MG tablet Take 1 tablet (1,000 mg total) by mouth 3 (three) times daily. 06/05/19   Tanner, Lyndon Code., PA-C  Calcium Carbonate (CALCIUM 600) 1500 MG TABS Take 2 tablets by mouth daily.    09/11/18  [provider]    Family History Family History  Problem Relation Age of Onset  . Heart disease Father        pacemaker  . Lung cancer Father        smoker; deceased 23  . Colon cancer Mother 69       2nd rectal ca at 22  . Colon cancer Maternal Grandfather         dx 31s; deceased 33s  . Depression Sister   . Leukemia Maternal Aunt        deceased 30    Social History Social History   Tobacco Use  . Smoking status: Former Smoker    Packs/day: 1.00    Years: 20.00    Pack years: 20.00    Types: Cigarettes    Quit date: 10/08/1980    Years since quitting: 38.7  . Smokeless tobacco: Never Used  Substance Use Topics  . Alcohol use: Not Currently    Comment: rarely, 12-23-15 rarely  . Drug use: No     Allergies   Codeine   Review of Systems Review of Systems  All other systems reviewed and are negative.    Physical Exam Updated Vital Signs BP (!) 136/50 (BP Location: Left Arm)   Pulse 75   Temp 98.5 F (36.9 C) (Oral)   Resp 15   SpO2 97%   Physical Exam Vitals signs and nursing note reviewed.  Constitutional:      Appearance: She is well-developed.  HENT:     Head: Normocephalic and atraumatic.  Cardiovascular:     Rate and Rhythm: Normal rate and regular rhythm.     Heart sounds: No murmur.  Pulmonary:     Effort: Pulmonary effort is normal. No respiratory distress.     Comments: Occasional rhonchi Abdominal:     Palpations: Abdomen is soft.     Tenderness: There is no abdominal tenderness. There is no guarding or rebound.  Musculoskeletal:        General: No swelling or tenderness.  Skin:    General: Skin is  warm and dry.     Coloration: Skin is pale.  Neurological:     Mental Status: She is alert and oriented to person, place, and time.     Comments: Mildly confused, generalized weakness  Psychiatric:        Behavior: Behavior normal.      ED Treatments / Results  Labs (all labs ordered are listed, but only abnormal results are displayed) Labs Reviewed  SARS CORONAVIRUS 2 (HOSPITAL ORDER, [facility])  COMPREHENSIVE METABOLIC PANEL  CBC WITH DIFFERENTIAL/PLATELET    EKG None  Radiology No results found.  Procedures Procedures (including critical care time)   Medications Ordered in ED Medications  guaiFENesin (MUCINEX) 12 hr tablet 600 mg (600 mg Oral Given 07/09/19 1403)  albuterol (VENTOLIN HFA) 108 (90 Base) MCG/ACT inhaler 2 puff (2 puffs Inhalation Given 07/09/19 1404)     Initial Impression / Assessment and Plan / ED Course  I have reviewed the triage vital signs and the nursing notes.  Pertinent labs & imaging results that were available during my care of the patient were reviewed by me and considered in my medical decision making (see chart for details).        Patient with history of COPD, atrial fibrillation, CHF, recurrent anemia, ovarian cancer here for evaluation of progressive shortness of breath and cough. She is chronically ill appearing on evaluation but non-toxic appearing. She does have some rhonchi on examination with a frequent, wet cough. Patient care transferred pending labs, imaging.  Final Clinical Impressions(s) / ED Diagnoses   Final diagnoses:  None    ED Discharge Orders    None       Quintella Reichert, MD 07/09/19 1521

## 2019-07-09 NOTE — ED Triage Notes (Signed)
Pt's daughter states that patient been really fatigue/weak and having some confusion/forgetting things and having trouble staying a wake and being congested for several days. Had virtual visit yesterday with pulmonary doctor and was started on new antibiotics, but they wouldn't see her in the office nor her general MD seeing patients in office. Pt is also a cancer patient. Pt reports hx anemia with hx transfusions. Pt gets more SOB when moving around, Is on O2.  Pt c/o back pains.

## 2019-07-09 NOTE — ED Notes (Signed)
ED TO INPATIENT HANDOFF REPORT  Name/Age/Gender [name] 85 y.o. female  Code Status Code Status History    Date Active Date Inactive Code Status Order ID Comments User Context   04/26/2019 1955 04/27/2019 2045 Full Code [id number]  Bethena Roys, MD Inpatient   03/20/2018 1639 03/23/2018 1457 Full Code [id number]  Lahoma Crocker, MD Inpatient   03/04/2018 0527 03/07/2018 1405 Full Code [id number]  Rise Patience, MD Inpatient   07/18/2017 1405 07/24/2017 1717 Full Code [id number]  Heath Lark, MD Inpatient   07/07/2017 2023 07/10/2017 1749 Full Code [id number]  Theodis Blaze, MD ED   06/30/2017 1709 07/01/2017 1714 Full Code [id number]  Donne Hazel, MD ED   12/11/2013 1842 12/14/2013 1815 Full Code [id number]  [name], MD Inpatient   12/08/2013 2038 12/11/2013 1842 Full Code [id number]  Kinnie Feil, MD Inpatient   08/06/2012 1317 08/09/2012 1422 Full Code [id number]  Denton Meek, RN Inpatient   Advance Care Planning Activity      Home/SNF/Other Home  Chief Complaint chemo pt / conjestion/ short of breath   Level of Care/Admitting Diagnosis ED Disposition    ED Disposition Condition [place]: [facility] [100102]  Level of Care: Med-Surg [16]  Covid Evaluation: Confirmed COVID Negative  Diagnosis: Lobar pneumonia [facility]) [[id number]]  Admitting Physician: [place], [place]  Attending Physician: Samuella Cota [[id number]]  Estimated length of stay: past midnight tomorrow  Certification:: I certify this patient will need inpatient services for at least 2 midnights  PT Class (Do Not Modify): Inpatient [101]  PT Acc Code (Do Not Modify): Private [1]       Medical History Past Medical History:  Diagnosis Date  . Anxiety   . Chronic blood loss anemia    03-04-2018 diverticular bleed and rectal bleeding,  transfused 2 units PRBCs 03-08-2018  . Colitis   . COPD (chronic obstructive pulmonary disease) (HCC)     Dr. Lamonte Sakai  . Depression   . Diastolic CHF, chronic ([place])   . Diverticulosis   . Family history of colon cancer   . Fibromyalgia   . Genetic testing 04/07/2018   MyRisk (35 genes) @ [facility] - No pathogenic mutations detected  . GERD (gastroesophageal reflux disease)   . Hemorrhoids   . Hiatal hernia   . Hip pain 07/2018   Right Hip Pain  . History of rectal polyps   . History of shingles   . Hypothyroidism   . IBS (irritable bowel syndrome)   . Lymphocytic colitis    Dr. Henrene Pastor  . Malignant ascites    Admission 06/2017 abdominal s/p parencentesis 07-01-2017 2.5L, 07-08-2016  2.7L, 07-12-2017  1462m  . [place] chemotherapeutic drug ([place]   . On home O2   . Osteoporosis   . Ovarian cancer ([facility]    Chemotherapy - Dr. GAlvy Bimler . Paroxysmal atrial fibrillation ([place]    Xarelto stopped 03-07-2018 due to lower GI bleed  . Pleural effusion    s/p  right thoracentesis, 02-2018 1.3L and 03-17-2018 right thoracentesis 6452m, post cxr no residual effusion  . [place]iatic [place] ([place]  . Schatzki's ring 2013  . Seasonal allergic rhinitis     Allergies Allergies  Allergen Reactions  . Codeine Itching    IV Location/Drains/Wounds Patient Lines/Drains/Airways Status   Active Line/Drains/Airways    Name:   Placement date:   Placement time:   Site:   Days:  Implanted Port 03/04/18 Right Chest   (S) 03/04/18    0047    Chest   492   Chest Tube 1 Right Pleural 15.5 Fr.   06/24/18    0741    Pleural   380   Incision (Closed) 03/20/18 Abdomen   03/20/18    1433     476   Incision (Closed) 06/24/18 Chest Right   06/24/18    0800     380          Labs/Imaging Results for orders placed or performed during the hospital encounter of 07/09/19 (from the past 48 hour(s))  Comprehensive metabolic panel     Status: Abnormal   Collection Time: 07/09/19  2:16 PM  Result Value Ref Range   Sodium 131 (L) 135 - 145 mmol/L   Potassium 4.0 3.5 - 5.1 mmol/L   Chloride 91 (L) 98 - 111  mmol/L   CO2 31 22 - 32 mmol/L   Glucose, Bld 110 (H) 70 - 99 mg/dL   BUN 21 8 - 23 mg/dL   Creatinine, Ser 0.99 0.44 - 1.00 mg/dL   Calcium 8.4 (L) 8.9 - 10.3 mg/dL   Total Protein 6.0 (L) 6.5 - 8.1 g/dL   Albumin 3.0 (L) 3.5 - 5.0 g/dL   AST 13 (L) 15 - 41 U/L   ALT 15 0 - 44 U/L   Alkaline Phosphatase 56 38 - 126 U/L   Total Bilirubin 0.9 0.3 - 1.2 mg/dL   GFR calc non Af Amer 52 (L) >60 mL/min   GFR calc Af Amer >60 >60 mL/min   Anion gap 9 5 - 15    Comment: Performed at [facility], [place] [address] [place], [place] [address]  CBC with Differential     Status: Abnormal   Collection Time: 07/09/19  2:16 PM  Result Value Ref Range   WBC 17.6 (H) 4.0 - 10.5 K/uL   RBC 3.48 (L) 3.87 - 5.11 MIL/uL   Hemoglobin 8.7 (L) 12.0 - 15.0 g/dL   HCT 29.4 (L) 36.0 - 46.0 %   MCV 84.5 80.0 - 100.0 fL   MCH 25.0 (L) 26.0 - 34.0 pg   MCHC 29.6 (L) 30.0 - 36.0 g/dL   RDW 20.2 (H) 11.5 - 15.5 %   Platelets 256 150 - 400 K/uL   nRBC 0.0 0.0 - 0.2 %   Neutrophils Relative % 80 %   Neutro Abs 14.1 (H) 1.7 - 7.7 K/uL   Lymphocytes Relative 8 %   Lymphs Abs 1.4 0.7 - 4.0 K/uL   Monocytes Relative 10 %   Monocytes Absolute 1.7 (H) 0.1 - 1.0 K/uL   Eosinophils Relative 1 %   Eosinophils Absolute 0.1 0.0 - 0.5 K/uL   Basophils Relative 0 %   Basophils Absolute 0.1 0.0 - 0.1 K/uL   Immature Granulocytes 1 %   Abs Immature Granulocytes 0.19 (H) 0.00 - 0.07 K/uL    Comment: Performed at [facility], [place] [address] [place], [place] [address]  SARS Coronavirus 2 [facility] order, Performed in [facility] hospital lab) Nasopharyngeal Nasopharyngeal Swab     Status: None   Collection Time: 07/09/19  2:16 PM   Specimen: Nasopharyngeal Swab  Result Value Ref Range   SARS Coronavirus 2 NEGATIVE NEGATIVE    Comment: (NOTE) If result is NEGATIVE SARS-CoV-2 target nucleic acids are NOT DETECTED. The SARS-CoV-2 RNA is generally detectable in upper and lower   respiratory specimens during the  acute phase of infection. The lowest  concentration of SARS-CoV-2 viral copies this assay can detect is 250  copies / mL. A negative result does not preclude SARS-CoV-2 infection  and should not be used as the sole basis for treatment or other  patient management decisions.  A negative result may occur with  improper specimen collection / handling, submission of specimen other  than nasopharyngeal swab, presence of viral mutation(s) within the  areas targeted by this assay, and inadequate number of viral copies  (<250 copies / mL). A negative result must be combined with clinical  observations, patient history, and epidemiological information. If result is POSITIVE SARS-CoV-2 target nucleic acids are DETECTED. The SARS-CoV-2 RNA is generally detectable in upper and lower  respiratory specimens dur ing the acute phase of infection.  Positive  results are indicative of active infection with SARS-CoV-2.  Clinical  correlation with patient history and other diagnostic information is  necessary to determine patient infection status.  Positive results do  not rule out bacterial infection or co-infection with other viruses. If result is PRESUMPTIVE POSTIVE SARS-CoV-2 nucleic acids MAY BE PRESENT.   A presumptive positive result was obtained on the submitted specimen  and confirmed on repeat testing.  While 2019 novel coronavirus  (SARS-CoV-2) nucleic acids may be present in the submitted sample  additional confirmatory testing may be necessary for epidemiological  and / or clinical management purposes  to differentiate between  SARS-CoV-2 and other Sarbecovirus currently known to infect humans.  If clinically indicated additional testing with an alternate test  methodology [phone]) is advised. The SARS-CoV-2 RNA is generally  detectable in upper and lower respiratory sp ecimens during the acute  phase of infection. The expected result is Negative. Fact  Sheet for Patients:  StrictlyIdeas.no Fact Sheet for Healthcare Providers: BankingDealers.co.za This test is not yet approved or cleared by the [place] FDA and has been authorized for detection and/or diagnosis of SARS-CoV-2 by FDA under an Emergency Use Authorization (EUA).  This EUA will remain in effect (meaning this test can be used) for the duration of the COVID-19 declaration under Section 564(b)(1) of the Act, 21 U.S.C. section 360bbb-3(b)(1), unless the authorization is terminated or revoked sooner. Performed at [facility], [place] [address], [place], [place] [address]    *Note: Due to a large number of results and/or encounters for the requested time period, some results have not been displayed. A complete set of results can be found in Results Review.   Dg Chest Port 1 View  Result Date: 07/09/2019 CLINICAL DATA:  Shortness of breath, cough EXAM: PORTABLE CHEST 1 VIEW COMPARISON:  05/19/2019 FINDINGS: Right Port-A-Cath is in place, unchanged. Small bilateral pleural effusions, right greater than left. Bibasilar atelectasis or infiltrates, also greater on the right. Findings have worsened on the right since prior study. Heart is normal size. IMPRESSION: Increasing right pleural effusion and right basilar atelectasis or infiltrate. Small left effusion and left base atelectasis. Electronically Signed   By: Rolm Baptise M.D.   On: 07/09/2019 15:46    Pending Labs Unresulted Labs (From admission, onward)   None      Vitals/Pain Today's Vitals   07/09/19 1029 07/09/19 1304 07/09/19 1510 07/09/19 1512  BP: (!) 126/54 (!) 136/50  (!) 125/50  Pulse: 82 75  75  Resp: _0 Temp: 98.5 F (36.9 C)     TempSrc: Oral     SpO2: 97% 97%  99%    Isolation  Precautions Airborne and Contact precautions  Medications Medications  guaiFENesin (MUCINEX) 12 hr tablet 600 mg (600 mg Oral Given 07/09/19 1403)   azithromycin (ZITHROMAX) 500 mg in sodium chloride 0.9 % 250 mL IVPB (has no administration in time range)  albuterol (VENTOLIN HFA) 108 (90 Base) MCG/ACT inhaler 2 puff (2 puffs Inhalation Given 07/09/19 1404)  cefTRIAXone (ROCEPHIN) 1 g in sodium chloride 0.9 % 100 mL IVPB (1 g Intravenous New Bag/Given 07/09/19 1609)    Mobility walks with person assist

## 2019-07-09 NOTE — H&P (Signed)
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  Admitted for pneumonia.  Patient reports she is of hearted developing increasing shortness of breath yesterday with productive cough.  No specific aggravating or alleviating factors.  She had a tele-visit yesterday with her pulmonologist and was prescribed doxycycline and prednisone for COPD exacerbation.  Woke up this morning feeling worse.  No chest pain.  ED Course: Treated with albuterol, guaifenesin, Zithromax, ceftriaxone  Chart review  Oncology office visit 9/18: Left ovarian epithelial cancer.  Multiple side effects related to chemotherapy, despite poor quality of life, patient wants to continue on treatment.  Oncology plans to continue treatment every other week along with tumor marker monitoring during CT every 3 months.  Goals of care were to continue treatment despite extremely poor quality of life.  Treatment goal is palliative.  Tele-visit pulmonology 9/30: COPD exacerbation started on doxycycline, chest x-ray ordered.  Recurrent pleural effusion plan for monitoring.  No known COVID contacts  Review of Systems:  Negative for fever, visual changes, sore throat,  new muscle aches, chest pain,  dysuria, bleeding, n/v/abdominal pain.  Positive for shingles right upper back within the last month  Past Medical History:  Diagnosis Date   Anxiety    Chronic blood loss anemia    03-04-2018 diverticular bleed and rectal bleeding,  transfused 2 units PRBCs 03-08-2018   Colitis    COPD (chronic obstructive pulmonary disease) (HCC)    Dr. Lamonte Sakai   Depression    Diastolic CHF, chronic ([place])      Diverticulosis    Family history of colon cancer    Fibromyalgia    Genetic testing 04/07/2018   MyRisk (35 genes) @ [facility] - No pathogenic mutations detected   GERD (gastroesophageal reflux disease)    Hemorrhoids    Hiatal hernia    Hip pain 07/2018   Right Hip Pain   History of rectal polyps    History of shingles    Hypothyroidism    IBS (irritable bowel syndrome)    Lymphocytic colitis    Dr. Henrene Pastor   Malignant ascites    Admission 06/2017 abdominal s/p parencentesis 07-01-2017 2.5L, 07-08-2016  2.7L, 07-12-2017  1430m   Neuropathy due to chemotherapeutic drug ([place]    On home O2    Osteoporosis    [place]varian cancer ([facility]    Chemotherapy - Dr. GAlvy Bimler  Paroxysmal atrial fibrillation ([place]    Xarelto stopped 03-07-2018 due to lower GI bleed   Pleural effusion    s/p  right thoracentesis, 02-2018 1.3L and 03-17-2018 right thoracentesis 6454m, post cxr no residual effusion   [place]oriatic arthritis ([place]   Schatzki's ring 2013   Seasonal allergic rhinitis     Past Surgical History:  Procedure Laterality Date   CARDIOVASCULAR STRESS TEST  09/23/2012   Low risk lexiscan nuclear study w/ apical thinning but no evidence of ischemia/  normal LV function and wall motion , ef 75%   CATARACT EXTRACTION W/ INTRAOCULAR LENS  IMPLANT, BILATERAL  10/2016   CHEST TUBE INSERTION Right 06/24/2018   Procedure: INSERTION PLEURAL DRAINAGE CATHETER;  Surgeon: GeGrace IsaacMD;  [facility] Service: Thoracic;  Laterality: Right;   COLONOSCOPY  DEBULKING N/A 03/20/2018   Procedure: DEBULKING;  Surgeon: Isabel Caprice, MD;  Location: [facility];  Service: Gynecology;  Laterality: N/A;   EXAM UNDER ANESTHESIA WITH MANIPULATION OF KNEE Left 12-20-2003  dr Noemi Chapel   post TKA   FEMUR IM NAIL Left 12/11/2013   Procedure: INTRAMEDULLARY (IM) NAIL FEMORAL;  Surgeon: [name], MD;  Location: [facility];  Service: Orthopedics;  Laterality: Left;    HYSTERECTOMY ABDOMINAL WITH SALPINGO-OOPHORECTOMY Bilateral 03/20/2018   Procedure: TOTAL HYSTERECTOMY ABDOMINAL WITH BILATERAL  SALPINGO-OOPHORECTOMY;  Surgeon: Isabel Caprice, MD;  [facility]: [facility];  Service: Gynecology;  Laterality: Bilateral;   IR FLUORO GUIDE PORT INSERTION RIGHT  07/22/2017   IR PARACENTESIS  07/12/2017   IR THORACENTESIS ASP PLEURAL SPACE W/IMG GUIDE  03/17/2018   IR THORACENTESIS ASP PLEURAL SPACE W/IMG GUIDE  05/08/2018   IR US GUIDE VASC ACCESS RIGHT  07/22/2017   KNEE ARTHROSCOPY W/ LATERAL RELEASE Left 09-03-2005   dr Noemi Chapel  [facility]   w/  Lysis Adhesions,  excision loose body's   LAPAROSCOPIC CHOLECYSTECTOMY  12-04-2010  dr zeigler   LAPAROTOMY N/A 03/20/2018   Procedure: EXPLORATORY LAPAROTOMY;  Surgeon: Isabel Caprice, MD;  [facility];  Service: Gynecology;  Laterality: N/A;   OMENTECTOMY N/A 03/20/2018   Procedure: OMENTECTOMY;  Surgeon: Isabel Caprice, MD;  [facility];  Service: Gynecology;  Laterality: N/A;   OTHER SURGICAL HISTORY  06/24/2018   Plurex Catheter inserted into the lungs   TOTAL KNEE ARTHROPLASTY Left 09-08-2003   dr Noemi Chapel  [place]  08/06/2012   Procedure: TOTAL KNEE REVISION;  Surgeon: [name], MD;  Location[facility];  Service: Orthopedics;  Laterality: Left;  Left Total Knee Arthroplasty Revision   TRANSTHORACIC ECHOCARDIOGRAM  08/20/2017   ef 60-65%,  grade 1 diastolic dysfunction/  trivial AR and TR   Uterine polypectomy       reports that she quit smoking about 38 years ago. Her smoking use included cigarettes. She has a 20.00 pack-year smoking history. She has never used smokeless tobacco. She reports previous alcohol use. She reports that she does not use drugs. Mobility: Ambulatory with a walker, lives alone, has help in the mornings.  No falls.  Allergies  Allergen Reactions   Codeine Itching    Family History  Problem Relation Age of Onset   Heart disease Father         pacemaker   Lung cancer Father        smoker; deceased 68   Colon cancer Mother 10       2nd rectal ca at 39   Colon cancer Maternal Grandfather        dx 9s; deceased 39s   Depression Sister    Leukemia Maternal Aunt        deceased 66     Prior to Admission medications   Medication Sig Start Date End Date Taking? Authorizing Provider  albuterol (PROAIR HFA) 108 (90 Base) MCG/ACT inhaler Inhale 2 puffs into the lungs every 6 (six) hours as needed for wheezing or shortness of breath. 04/01/19  Yes Byrum, Rose Fillers, MD  ALPRAZolam Duanne Moron) 0.25 MG tablet Take 1 tablet (0.25 mg total) by mouth at bedtime as needed for anxiety. 11/15/17  Yes Amin, Jeanella Flattery, MD  Cholecalciferol (VITAMIN D) 2000 units CAPS Take 2,000 Units by mouth daily.    Yes [provider]  dicyclomine (BENTYL) 10 MG capsule TAKE 1 CAPSULE(10 MG) BY  MOUTH TWICE DAILY Patient taking differently: Take 10 mg by mouth 2 (two) times daily.  07/07/19  Yes [name], MD  diltiazem (CARDIZEM CD) 180 MG 24 hr capsule Take 1 capsule (180 mg total) by mouth 2 (two) times a day. 04/28/19  Yes Satira Sark, MD  doxycycline (VIBRA-TABS) 100 MG tablet Take 1 tablet (100 mg total) by mouth 2 (two) times daily. 07/08/19  Yes Martyn Ehrich, NP  DULoxetine (CYMBALTA) 60 MG capsule Take 1 capsule (60 mg total) by mouth at bedtime. 05/08/19  Yes Gorsuch, Ni, MD  furosemide (LASIX) 40 MG tablet Take 1 tablet (40 mg total) by mouth daily. 10/13/18  Yes Satira Sark, MD  gabapentin (NEURONTIN) 300 MG capsule Take 1 capsule (300 mg total) by mouth at bedtime. 06/26/19 07/26/19 Yes Gorsuch, Ni, MD  guaiFENesin (MUCINEX) 600 MG 12 hr tablet Take 1 tablet (600 mg total) by mouth 2 (two) times daily. 07/08/19  Yes Martyn Ehrich, NP  hydrocortisone valerate cream (WESTCORT) 0.2 % Apply 1 application topically 2 (two) times daily. 06/25/19  Yes Tanner, Lyndon Code., PA-C  levothyroxine (SYNTHROID, LEVOTHROID) 137 MCG tablet Take  137 mcg by mouth daily before breakfast. For thyroid therapy   Yes [provider]  Magnesium 400 MG CAPS Take 400 mg by mouth daily.    Yes [provider]  mirtazapine (REMERON) 15 MG tablet Take 1 tablet (15 mg total) by mouth at bedtime. 04/08/19  Yes Gorsuch, Ni, MD  morphine (MS CONTIN) 15 MG 12 hr tablet Take 1 tablet (15 mg total) by mouth every 12 (twelve) hours. 06/24/19  Yes Gorsuch, Ni, MD  morphine (MSIR) 15 MG tablet Take 1 tablet (15 mg total) by mouth every 4 (four) hours as needed for severe pain. 07/03/19  Yes Tish Men, MD  polyvinyl alcohol (LIQUIFILM TEARS) 1.4 % ophthalmic solution Place 1 drop into both eyes 2 (two) times daily as needed for dry eyes.    Yes [provider]  potassium chloride SA (K-DUR,KLOR-CON) 20 MEQ tablet TAKE 1 TABLET BY MOUTH DAILY AND TAKE 1 ADDITIONAL TABLET WHEN TAKING EXTRA LASIX DOSE Patient taking differently: Take 20 mEq by mouth daily.  11/10/18  Yes Satira Sark, MD  Rivaroxaban (XARELTO) 15 MG TABS tablet Take 15 mg by mouth daily.   Yes [provider]  Tiotropium Bromide-Olodaterol (STIOLTO RESPIMAT) 2.5-2.5 MCG/ACT AERS Inhale 2 puffs into the lungs daily. 06/08/19  Yes Collene Gobble, MD  azelastine (ASTELIN) 0.1 % nasal spray Place 2 sprays into both nostrils 2 (two) times daily as needed for allergies.     [provider]  diphenhydrAMINE (BENADRYL) 25 mg capsule Take 25 mg by mouth every 8 (eight) hours as needed for itching.     [provider]  fluticasone (FLONASE) 50 MCG/ACT nasal spray Place 2 sprays into both nostrils 2 (two) times daily as needed (FOR NASAL CONGESTION.).  11/20/13   [provider]  halobetasol (ULTRAVATE) 0.05 % cream Apply 1 application topically 2 (two) times daily as needed (psoriasis).     [provider]  metoprolol tartrate (LOPRESSOR) 25 MG tablet Take 1 tablet (25 mg total) by mouth 2 (two) times daily as needed. Patient taking  differently: Take 25 mg by mouth daily as needed (blood pressure).  04/20/19 07/19/19  Imogene Burn, PA-C  valACYclovir (VALTREX) 1000 MG tablet Take 1 tablet (1,000 mg total) by mouth 3 (three) times daily. Patient not taking: Reported on 07/09/2019  06/05/19   Tanner, Lyndon Code., PA-C  Calcium Carbonate (CALCIUM 600) 1500 MG TABS Take 2 tablets by mouth daily.    09/11/18  [provider]    Physical Exam: Vitals:   07/09/19 1510 07/09/19 1512  BP:  (!) 125/50  Pulse:  75  Resp: 15 16  Temp:    SpO2:  99%    Constitutional:    Appears calm, mildly uncomfortable Eyes:   pupils and irises appear normal  Normal lids  ENMT:   grossly normal hearing   Lips appear normal Neck:   neck appears normal, no masses  no thyromegaly Respiratory:   Fair air movement.  No frank wheezes, rales or rhonchi on the left.  There are some coarse breath sounds on the right.  Respiratory effort mildly increased. Cardiovascular:   RRR, no m/r/g  No LE extremity edema   Abdomen:   No tenderness or masses.  Surgical scar noted.  No hernias noted. Musculoskeletal:   Digits/nails BUE: no clubbing, cyanosis, petechiae, infection  exam of joints, bones, muscles of at least one of following: RUE, LUE, RLE, LLE    strength and tone appear grossly normal Skin:   Hyperpigmentation right upper back consistent with history of previous shingles.  No lesions noted.  palpation of skin: no induration or nodules Psychiatric:   Mental status o Mood, affect appropriate  judgment and insight appear intact .  Good historian  I have personally reviewed following labs and imaging studies  Labs:  Sodium 131, remainder BMP unremarkable.  LFTs unremarkable. WBC 17.6, hemoglobin 8.7, platelets within normal limits.  Imaging studies:   Chest x-ray independently reviewed showed right base infiltrate new, bilateral pleural effusions (old)  Medical tests:   EKG independently reviewed:  Atrial flutter, controlled ventricular rate, 4:1 conduction block  Principal Problem:   Lobar pneumonia (HCC) Active Problems:   COPD (chronic obstructive pulmonary disease) (HCC)   Left ovarian epithelial cancer (HCC)   Anemia, chronic disease   Chronic respiratory failure with hypoxia (HCC)   Atrial fibrillation (HCC)   Assessment/Plan Right lobar pneumonia, chronic bilateral pleural effusions --Treated with empiric antibiotics.  Effusions appear to be small and I do not think are substantially contributing.  If infection fails to clear as would be expected, can consider diagnostic thoracentesis.  Do not think this is necessary at this point.  Chronic hypoxic respiratory failure, COPD --Appears stable currently.  Continue prednisone started yesterday.  Continue bronchodilators.  Left ovarian epithelial cancer --Follow-up with oncology as an outpatient.  Anemia of chronic disease, multifactorial including chronic kidney disease and anemia of chronic disease from treatment --Hemoglobin stable.  Postherpetic neuralgia, severe --Continue gabapentin, narcotic   Paroxysmal atrial fibrillation, not on anticoagulation secondary to GI bleed 2019. --Continue Cardizem  Severity of Illness: The appropriate patient status for this patient is INPATIENT. Inpatient status is judged to be reasonable and necessary in order to provide the required intensity of service to ensure the patient's safety. The patient's presenting symptoms, physical exam findings, and initial radiographic and laboratory data in the context of their chronic comorbidities is felt to place them at high risk for further clinical deterioration. Furthermore, it is not anticipated that the patient will be medically stable for discharge from the hospital within 2 midnights of admission. The following factors support the patient status of inpatient.   " The patient's presenting symptoms include shortness of breath, productive  cough. " The worrisome physical exam findings include coarse breath sounds on the right. "  The initial radiographic and laboratory data are worrisome because of right lower lobe pneumonia, bilateral pleural effusions, leukocytosis. 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   * I certify that at the point of admission it is my clinical judgment that the patient will require inpatient hospital care spanning beyond 2 midnights from the point of admission due to high intensity of service, high risk for further deterioration and high frequency of surveillance required.*  DVT prophylaxis: enoxaparin Code Status: Full per patient Family Communication: none. Patient awake, alert, good historian, understands plan Consults called: none    Time spent: 60 minutes  Murray Hodgkins, MD  [facility] Direct contact: see [email or web address]  7PM-7AM contact night coverage as below   1. Check the care team in [facility] and look for a) attending/consulting [facility] provider listed and b) the [facility] team listed 2. Log into [email or web address] and use [facility] universal password to access. If you do not have the password, please contact the hospital operator. 3. Locate the [facility] provider you are looking for under [facility] and page to a number that you can be directly reached. 4. If you still have difficulty reaching the provider, please page the [facility] (Director on Call) for the Hospitalists listed on amion for assistance.   07/09/2019, 6:34 PM

## 2019-07-09 NOTE — ED Notes (Signed)
Transport arrived to take pt upstairs

## 2019-07-10 LAB — BASIC METABOLIC PANEL
Anion gap: 8 (ref 5–15)
BUN: 17 mg/dL (ref 8–23)
CO2: 30 mmol/L (ref 22–32)
Calcium: 8.2 mg/dL — ABNORMAL LOW (ref 8.9–10.3)
Chloride: 97 mmol/L — ABNORMAL LOW (ref 98–111)
Creatinine, Ser: 0.86 mg/dL (ref 0.44–1.00)
GFR calc Af Amer: >60 mL/min (ref >60)
GFR calc non Af Amer: >60 mL/min (ref >60)
Glucose, Bld: 106 mg/dL — ABNORMAL HIGH (ref 70–99)
Potassium: 3.9 mmol/L (ref 3.5–5.1)
Sodium: 135 mmol/L (ref 135–145)

## 2019-07-10 LAB — CBC
HCT: 27.3 % — ABNORMAL LOW (ref 36.0–46.0)
Hemoglobin: 8 g/dL — ABNORMAL LOW (ref 12.0–15.0)
MCH: 24.9 pg — ABNORMAL LOW (ref 26.0–34.0)
MCHC: 29.3 g/dL — ABNORMAL LOW (ref 30.0–36.0)
MCV: 85 fL (ref 80.0–100.0)
Platelets: 235 10*3/uL (ref 150–400)
RBC: 3.21 MIL/uL — ABNORMAL LOW (ref 3.87–5.11)
RDW: 20 % — ABNORMAL HIGH (ref 11.5–15.5)
WBC: 12.5 10*3/uL — ABNORMAL HIGH (ref 4.0–10.5)
nRBC: 0 % (ref 0.0–0.2)

## 2019-07-10 LAB — EXPECTORATED SPUTUM ASSESSMENT W GRAM STAIN, RFLX TO RESP C

## 2019-07-10 MED ORDER — MORPHINE SULFATE 15 MG PO TABS: 15.0000 mg | ORAL_TABLET | ORAL | Status: DC | PRN

## 2019-07-10 MED ORDER — OXYCODONE HCL 5 MG PO TABS
10.0000 mg | ORAL_TABLET | Freq: Once | ORAL | Status: AC
  Administered 2019-07-10: 10 mg via ORAL
  Filled 2019-07-10: qty 2

## 2019-07-10 MED ORDER — ENSURE ENLIVE PO LIQD
237.0000 mL | Freq: Every day | ORAL | Status: DC
  Administered 2019-07-10 – 2019-07-11 (×2): 237 mL via ORAL

## 2019-07-10 MED ORDER — SODIUM CHLORIDE 0.9% FLUSH
20.0000 mL | Freq: Two times a day (BID) | INTRAVENOUS | Status: DC
  Administered 2019-07-11: 21:00:00 20 mL via INTRAVENOUS

## 2019-07-10 MED ORDER — UMECLIDINIUM-VILANTEROL 62.5-25 MCG/INH IN AEPB
1.0000 | INHALATION_SPRAY | Freq: Every day | RESPIRATORY_TRACT | Status: DC
  Administered 2019-07-11 – 2019-07-12 (×2): 1 via RESPIRATORY_TRACT
  Filled 2019-07-10: qty 14

## 2019-07-10 MED ORDER — ALUM & MAG HYDROXIDE-SIMETH 200-200-20 MG/5ML PO SUSP
30.0000 mL | Freq: Once | ORAL | Status: AC
  Administered 2019-07-10: 23:00:00 30 mL via ORAL
  Filled 2019-07-10: qty 30

## 2019-07-10 NOTE — Progress Notes (Signed)
Initial Nutrition Assessment   RD working remotely.  DOCUMENTATION CODES:   Not applicable  INTERVENTION:  Provide Ensure Enlive po once daily, each supplement provides 350 kcal and 20 grams of protein.  Encourage adequate PO intake.   Recommend obtaining new weight to fully assess weight trends.  NUTRITION DIAGNOSIS:   Increased nutrient needs related to chronic illness(COPD, cancer) as evidenced by estimated needs.  GOAL:   Patient will meet greater than or equal to 90% of their needs  MONITOR:   PO intake, Supplement acceptance, Skin, Weight trends, Labs, I & O's  REASON FOR ASSESSMENT:   Consult Assessment of nutrition requirement/status  ASSESSMENT:   85 year old female with PMH of COPD, chronic respiratory failure on 2 LPM, left ovarian cancer on chemotherapy; presented with complain of shortness of breath, was found to have COPD exacerbation and lobar pneumonia.  Pt contacted via inpatient room phone. Pt reports having a good appetite currently and PTA with usual consumption of at least 3 meals a day with no difficulties. Meal completion 80%. Noted no new weight recorded. Pt reports however weight has been stable at ~176 lbs. RD to order nutritional supplements to aid in increased caloric and protein needs.   Unable to complete Nutrition-Focused physical exam at this time.   Labs and medications reviewed.   Diet Order:   Diet Order            Diet regular Room service appropriate? Yes; Fluid consistency: Thin  Diet effective now              EDUCATION NEEDS:   Not appropriate for education at this time  Skin:  Skin Assessment: Reviewed RN Assessment  Last BM:  9/30  Height:   Ht Readings from Last 1 Encounters:  07/10/19 5\' 4"  (1.626 m)    Weight:   Wt Readings from Last 1 Encounters:  06/26/19 80.1 kg    Ideal Body Weight:  54.5 kg  BMI:  Body mass index is 30.31 kg/m.  Estimated Nutritional Needs:   Kcal:  1700-1900  Protein:   85-100 grams  Fluid:  >/= 1.7 L/day    Corrin Parker, MS, RD, LDN Pager # [phone] After hours/ weekend pager # [phone]

## 2019-07-10 NOTE — Evaluation (Signed)
Occupational Therapy Evaluation Patient Details Name: [name] MRN: [id number] DOB: 03-22-1933 Today's Date: 07/10/2019    History of Present Illness Pt is a 85 yo female admitted for COPD exacerbation and pneumonia.  Pt with h/o ovarian CA currently receiving chemo, chronic respiratory failure and Afib.   Clinical Impression   Pt admitted with the above diagnosis and has the deficits listed below. Pt would benefit from cont OT to increase knowledge of energy conservation techniques during adls to make caring for herself easier. Pt does have aid 5x week for 4-5 hours to assist with cooking and cleaning.  Feel pt most likely will not need follow up OT.     Follow Up Recommendations  No OT follow up;Supervision - Intermittent    Equipment Recommendations  None recommended by OT    Recommendations for Other Services       Precautions / Restrictions Precautions Precautions: Fall Precaution Comments: Pt has O2 at home with lines.  Has not taken a fall in years.  Restrictions Weight Bearing Restrictions: No      Mobility Bed Mobility               General bed mobility comments: Pt in chair on arrival  Transfers Overall transfer level: Needs assistance Equipment used: Rolling walker (2 wheeled) Transfers: Sit to/from [employer] Transfers Sit to Stand: Min guard Stand pivot transfers: Min assist       General transfer comment: Pt with O2 lines and IV Pole therefore assist given to avoid lines.      Balance Overall balance assessment: Needs assistance Sitting-balance support: Feet supported Sitting balance-Leahy Scale: Good     Standing balance support: During functional activity;Bilateral upper extremity supported Standing balance-Leahy Scale: Poor Standing balance comment: Needs walker or outside support to stand.  Endurance is poor.                           ADL either performed or assessed with clinical judgement   ADL Overall ADL's  : Needs assistance/impaired Eating/Feeding: Independent;Sitting   Grooming: Set up;Sitting   Upper Body Bathing: Set up;Sitting   Lower Body Bathing: Min guard;Sit to/from stand   Upper Body Dressing : Set up;Sitting   Lower Body Dressing: Min guard;Sit to/from stand   Toilet Transfer: Minimal assistance;Ambulation;RW;Grab bars Toilet Transfer Details (indicate cue type and reason): Pt walked to bathroom to toilet. Toileting- Clothing Manipulation and Hygiene: Supervision/safety;Sit to/from stand       Functional mobility during ADLs: Minimal assistance;Rolling walker General ADL Comments: Pt does well with basic adls.  Pt becomes very SOB very quickly when up on her feet therefore endurance is limited.  Pt does have help from 8-1 5x a week with cooking and cleaning.      Vision Baseline Vision/History: No visual deficits Patient Visual Report: No change from baseline       Perception Perception Perception Tested?: No   Praxis      Pertinent Vitals/Pain Pain Assessment: Faces Faces Pain Scale: Hurts little more Pain Location: r side Pain Descriptors / Indicators: Aching Pain Intervention(s): Limited activity within patient's tolerance;Monitored during session;Repositioned     Hand Dominance Right   Extremity/Trunk Assessment Upper Extremity Assessment Upper Extremity Assessment: Overall WFL for tasks assessed   Lower Extremity Assessment Lower Extremity Assessment: Defer to PT evaluation   Cervical / Trunk Assessment Cervical / Trunk Assessment: Normal   Communication Communication Communication: HOH   Cognition Arousal/Alertness: Awake/alert Behavior During  Therapy: WFL for tasks assessed/performed Overall Cognitive Status: Within Functional Limits for tasks assessed                                     General Comments  Pt most limited by SOB and poor activity tolerance.  Spoke with pt about pursed lip breathing which is very difficult for  her. Will continue to stress this and energy conservation techniques.    Exercises     Shoulder Instructions      Home Living Family/patient expects to be discharged to:: Unsure Living Arrangements: Alone Available Help at Discharge: Available PRN/intermittently;Personal care attendant;Family Type of Home: House Home Access: Stairs to enter [employer] of Steps: 2 in garage Entrance Stairs-Rails: Right Home Layout: One level     Bathroom Shower/Tub: Occupational psychologist: Standard Bathroom Accessibility: Yes   Home Equipment: Clinical cytogeneticist - 2 wheels;Bedside commode   Additional Comments: uses railing to get in home      Prior Functioning/Environment Level of Independence: Independent with assistive device(s)        Comments: amb with RW in and out of home; has a person come in at 8 in the mornning and stays til 130, she helps clean and cook for her, 5days/week, daughter lives near her and can help some        OT Problem List: Decreased activity tolerance;Decreased knowledge of use of DME or AE;Pain      OT Treatment/Interventions: Self-care/ADL training;Energy conservation;Therapeutic activities    OT Goals(Current goals can be found in the care plan section) Acute Rehab OT Goals Patient Stated Goal: to go home but be able to breathe better OT Goal Formulation: With patient Time For Goal Achievement: 07/24/19 Potential to Achieve Goals: Good ADL Goals Pt Will Perform Tub/Shower Transfer: Shower transfer;tub bench;with supervision Additional ADL Goal #1: Pt will state 3 things she can do differently at home to conserve energy during adls and simple kitchen tasks. Additional ADL Goal #2: Pt will walk to bathroom with walker and complete all toileting tasks with mod I  OT Frequency: Min 2X/week   Barriers to D/C: Decreased caregiver support  Pt lives alone but does have assist from aid 5x week in the monrings.       Co-evaluation  PT/OT/SLP Co-Evaluation/Treatment: Yes Reason for Co-Treatment: To address functional/ADL transfers PT goals addressed during session: Mobility/safety with mobility OT goals addressed during session: ADL's and self-care      AM-PAC OT "6 Clicks" Daily Activity     Outcome Measure Help from another person eating meals?: None   Help from another person toileting, which includes using toliet, bedpan, or urinal?: A Little Help from another person bathing (including washing, rinsing, drying)?: A Little Help from another person to put on and taking off regular upper body clothing?: None Help from another person to put on and taking off regular lower body clothing?: A Little 6 Click Score: 17   End of Session Equipment Utilized During Treatment: Gait belt;Rolling walker;Oxygen Nurse Communication: Mobility status  Activity Tolerance: Patient limited by fatigue Patient left: in chair;with call bell/phone within reach  OT Visit Diagnosis: Unsteadiness on feet (R26.81);Pain Pain - Right/Left: Right                Time: [id number] OT Time Calculation (min): 30 min Charges:  OT General Charges $OT Visit: 1 Visit OT Evaluation $OT Eval Moderate Complexity: 1  Mod   Glenford Peers 07/10/2019, 12:14 PM

## 2019-07-10 NOTE — Progress Notes (Signed)
[facility] Progress Note  Patient: [name] EHM:[id number]   PCP: Asencion Noble, MD DOB: 1932-12-23   DOA: 07/09/2019   DOS: 07/10/2019   Date of Service: the patient was seen and examined on 07/10/2019  Chief Complaint  Patient presents with  . Fatigue  . Altered Mental Status  . Nasal Congestion   Brief hospital course: Pt. with PMH of COPD, chronic respiratory failure on 2 LPM, left ovarian cancer on chemotherapy; presented with complain of shortness of breath, was found to have COPD exacerbation and lobar pneumonia.  Currently further plan is continue IV antibiotics and monitor.  Subjective: Patient reports pain on her left side.  Also reports right rib cage pain.  Continues to have cough.  Mild worsening of shortness of breath more than her baseline.  No dizziness no lightheadedness.  No nausea no vomiting.  No choking episode.  No diarrhea no constipation.  Assessment and Plan: 1.  Right lobar pneumonia Chronic bilateral pleural effusion. COPD with mild acute exacerbation. Chronic hypoxic respiratory failure Continue with IV ceftriaxone and azithromycin. Follow-up on cultures. Continue Mucinex, continue oral prednisone and inhalers.  2.  Hypothyroidism. Continue Synthroid.  3.  Mood disorder Chronic pain Continue Xanax, Cymbalta, gabapentin, Remeron. Continue PRN morphine.  4.  A. fib, paroxysmal Continue Xarelto, Cardizem.  Currently rate controlled.  Diet: Cardiac diet  DVT Prophylaxis: Therapeutic Anticoagulation with Xarelto  Advance goals of care discussion: Full code  Family Communication: no family was present at bedside, at the time of interview.  Patient wants me to contact her daughter. We will contact later this afternoon. satisfactorily.   Disposition:  Discharge to Home likely tomorrow.  Consultants: none Procedures: none  Scheduled Meds: . Chlorhexidine Gluconate Cloth  6 each Topical Daily  . dicyclomine  10 mg Oral BID  .  diltiazem  180 mg Oral BID  . DULoxetine  60 mg Oral QHS  . furosemide  40 mg Oral Daily  . gabapentin  300 mg Oral QHS  . guaiFENesin  600 mg Oral BID  . levothyroxine  137 mcg Oral QAC breakfast  . mirtazapine  15 mg Oral QHS  . potassium chloride SA  20 mEq Oral Daily  . predniSONE  40 mg Oral Q breakfast  . Rivaroxaban  15 mg Oral Q breakfast  . sodium chloride flush  3 mL Intravenous Q12H  . sodium chloride flush  3 mL Intravenous Q12H  . umeclidinium bromide  1 puff Inhalation Daily   Continuous Infusions: . sodium chloride    . azithromycin    . cefTRIAXone (ROCEPHIN)  IV     PRN Meds: sodium chloride, acetaminophen **OR** acetaminophen, albuterol, ALPRAZolam, azelastine, fluticasone, polyvinyl alcohol, sodium chloride flush Antibiotics: Anti-infectives (From admission, onward)   Start     Dose/Rate Route Frequency Ordered Stop   07/10/19 1800  azithromycin (ZITHROMAX) 500 mg in sodium chloride 0.9 % 250 mL IVPB     500 mg 250 mL/hr over 60 Minutes Intravenous Every 24 hours 07/09/19 2011 07/14/19 1759   07/10/19 1400  cefTRIAXone (ROCEPHIN) 2 g in sodium chloride 0.9 % 100 mL IVPB     2 g 200 mL/hr over 30 Minutes Intravenous Every 24 hours 07/09/19 2011 07/15/19 1359   07/09/19 1600  cefTRIAXone (ROCEPHIN) 1 g in sodium chloride 0.9 % 100 mL IVPB     1 g 200 mL/hr over 30 Minutes Intravenous  Once 07/09/19 1553 07/09/19 1720   07/09/19 1600  azithromycin (ZITHROMAX) 500 mg in sodium  chloride 0.9 % 250 mL IVPB     500 mg 250 mL/hr over 60 Minutes Intravenous  Once 07/09/19 1553 07/09/19 1826       Objective: Physical Exam: Vitals:   07/09/19 1510 07/09/19 1512 07/09/19 2056 07/10/19 0549  BP:  (!) 125/50 (!) 102/50 (!) 134/57  Pulse:  75 82 78  Resp: 15 16 17 19   Temp:   98.6 F (37 C) 97.6 F (36.4 C)  TempSrc:   Oral Oral  SpO2:  99% 97% 99%  Height:    (P) 5' 4"  (1.626 m)    Intake/Output Summary (Last 24 hours) at 07/10/2019 1610 Last data filed at  07/10/2019 0800 Gross per 24 hour  Intake 340 ml  Output 2 ml  Net 338 ml   There were no vitals filed for this visit. General: alert and oriented to time, place, and person. Appear in mild distress, affect appropriate Eyes: PERRL, Conjunctiva normal ENT: Oral Mucosa Clear, moist  Neck: no JVD, no Abnormal Mass Or lumps Cardiovascular: S1 and S2 Present, no Murmur, peripheral pulses symmetrical Respiratory: good respiratory effort, Bilateral Air entry equal and Decreased, no signs of accessory muscle use, bilateral  Crackles, no wheezes Abdomen: Bowel Sound present, Soft and no tenderness, no hernia Skin: no rashes  Extremities: no Pedal edema, no calf tenderness Neurologic: without any new focal findings Gait not checked due to patient safety concerns  Data Reviewed: I have personally reviewed and interpreted daily labs, tele strips, imagings as discussed above. I reviewed all nursing notes, pharmacy notes, vitals, pertinent old records I have discussed plan of care as described above with RN and patient/family.  CBC: Recent Labs  Lab 07/09/19 1416 07/10/19 0614  WBC 17.6* 12.5*  NEUTROABS 14.1*  --   HGB 8.7* 8.0*  HCT 29.4* 27.3*  MCV 84.5 85.0  PLT 256 960   Basic Metabolic Panel: Recent Labs  Lab 07/09/19 1416 07/10/19 0614  NA 131* 135  K 4.0 3.9  CL 91* 97*  CO2 31 30  GLUCOSE 110* 106*  BUN 21 17  CREATININE 0.99 0.86  CALCIUM 8.4* 8.2*    Liver Function Tests: Recent Labs  Lab 07/09/19 1416  AST 13*  ALT 15  ALKPHOS 56  BILITOT 0.9  PROT 6.0*  ALBUMIN 3.0*   No results for input(s): LIPASE, AMYLASE in the last 168 hours. No results for input(s): AMMONIA in the last 168 hours. Coagulation Profile: No results for input(s): INR, PROTIME in the last 168 hours. Cardiac Enzymes: No results for input(s): CKTOTAL, CKMB, CKMBINDEX, TROPONINI in the last 168 hours. BNP (last 3 results) No results for input(s): PROBNP in the last 8760 hours. CBG: No  results for input(s): GLUCAP in the last 168 hours. Studies: Dg Chest Port 1 View  Result Date: 07/09/2019 CLINICAL DATA:  Shortness of breath, cough EXAM: PORTABLE CHEST 1 VIEW COMPARISON:  05/19/2019 FINDINGS: Right Port-A-Cath is in place, unchanged. Small bilateral pleural effusions, right greater than left. Bibasilar atelectasis or infiltrates, also greater on the right. Findings have worsened on the right since prior study. Heart is normal size. IMPRESSION: Increasing right pleural effusion and right basilar atelectasis or infiltrate. Small left effusion and left base atelectasis. Electronically Signed   By: Rolm Baptise M.D.   On: 07/09/2019 15:46     Time spent: 35 minutes  Author: Berle Mull, MD [facility] 07/10/2019 8:39 AM  To reach On-call, see care teams to locate the attending and reach out to them via  [email or web address] If 7PM-7AM, please contact night-coverage If you still have difficulty reaching the attending provider, please page the [facility] (Director on Call) for [facility] on amion for assistance.

## 2019-07-10 NOTE — Evaluation (Signed)
Physical Therapy Evaluation Patient Details Name: [name] MRN: [id number] DOB: September 23, 1933 Today's Date: 07/10/2019   History of Present Illness  Pt is a 85 yo female admitted for COPD exacerbation and pneumonia.  Pt with h/o ovarian CA currently receiving chemo, chronic respiratory failure and Afib.    Clinical Impression  [name] is a 85 y.o. female admitted with above HPI and diagnosis. She reports independence at baseline with mobility using RW and that she has a personal aid come in 5x/week to assist with cleaning and meal prep. Patient currently is limited by functional impairments below (see PT problem list) and requires min guard for trasnfers and gait with RW. Her endurance is greatly limited and she will benefit from additional skilled PT to address impairments and educate on breathing techniques and energy conservation to reduce SOB during activity. Patient will benefit from additional skilled PT in home setting when medically ready to be discharged. Acute PT will follow and progress as able.    Follow Up Recommendations Home health PT    Equipment Recommendations  None recommended by PT    Recommendations for Other Services       Precautions / Restrictions Precautions Precautions: Fall Precaution Comments: Pt has O2 at home with lines.  Has not taken a fall in years.  Restrictions Weight Bearing Restrictions: No      Mobility  Bed Mobility               General bed mobility comments: Pt in chair on arrival  Transfers Overall transfer level: Needs assistance Equipment used: Rolling walker (2 wheeled) Transfers: Sit to/from Stand;Stand Pivot Transfers Sit to Stand: Min guard Stand pivot transfers: Min assist       General transfer comment: pt able to initiate power up to stand without assist, ceus for safe hand placement with RW requried, assist to manage O2 lines and IV Pole  Ambulation/Gait Ambulation/Gait assistance: Min guard Gait  Distance (Feet): 90 Feet Assistive device: Rolling walker (2 wheeled) Gait Pattern/deviations: Step-through pattern;Decreased stride length;Trunk flexed Gait velocity: decreased   General Gait Details: pt requried cues intermittently for safe hand placement and to maintain safe proximity to RW, pt required seated rest break after ~ 60' of ambulation, pt on 2L/min throughout gait, no overt LOB noted  Stairs            Wheelchair Mobility    Modified Rankin (Stroke Patients Only)       Balance Overall balance assessment: Needs assistance Sitting-balance support: Feet supported Sitting balance-Leahy Scale: Good     Standing balance support: During functional activity;Bilateral upper extremity supported Standing balance-Leahy Scale: Poor Standing balance comment: Needs walker or outside support to stand.  Endurance is poor.                             Pertinent Vitals/Pain Pain Assessment: Faces Faces Pain Scale: Hurts little more Pain Location: Rt side Pain Descriptors / Indicators: Aching Pain Intervention(s): Limited activity within patient's tolerance;Monitored during session;Repositioned    Home Living Family/patient expects to be discharged to:: Unsure Living Arrangements: Alone Available Help at Discharge: Available PRN/intermittently;Personal care attendant;Family Type of Home: House Home Access: Stairs to enter Entrance Stairs-Rails: Right Entrance Stairs-Number of Steps: 2 in garage Rt hand rail going up [place]: One level Home Equipment: Clinical cytogeneticist - 2 wheels;Bedside commode Additional Comments: uses railing to get in home    Prior Function Level of Independence:  Independent with assistive device(s)         Comments: pt uses RW in home and out of home, she sometimes uses rollator out in community, she has a personal aid come in from 8am-1:30pm 5 das/week to help her with cooking and cleaning, pt's daughter also lives close by and  can assist intermittenetly     Hand Dominance   Dominant Hand: Right    Extremity/Trunk Assessment   Upper Extremity Assessment Upper Extremity Assessment: Defer to OT evaluation    Lower Extremity Assessment Lower Extremity Assessment: Overall WFL for tasks assessed    Cervical / Trunk Assessment Cervical / Trunk Assessment: Normal  Communication   Communication: HOH  Cognition Arousal/Alertness: Awake/alert Behavior During Therapy: WFL for tasks assessed/performed Overall Cognitive Status: Within Functional Limits for tasks assessed         General Comments General comments (skin integrity, edema, etc.): educated pt throughout mobiltiy and seated rest breaks on pursed lip breathing to reduce SOB, pt with great difficulty expelling air. she will benefit further from mobility training with breathing exercises to improve endurance.    Exercises     Assessment/Plan    PT Assessment Patient needs continued PT services  PT Problem List Decreased strength;Decreased balance;Decreased mobility;Decreased activity tolerance       PT Treatment Interventions DME instruction;Functional mobility training;Balance training;Patient/family education;Gait training;Therapeutic activities;Therapeutic exercise;Stair training    PT Goals (Current goals can be found in the Care Plan section)  Acute Rehab PT Goals Patient Stated Goal: to go home but be able to breathe better PT Goal Formulation: With patient Time For Goal Achievement: 07/24/19 Potential to Achieve Goals: Good    Frequency Min 3X/week   Barriers to discharge        Co-evaluation PT/OT/SLP Co-Evaluation/Treatment: Yes Reason for Co-Treatment: To address functional/ADL transfers PT goals addressed during session: Mobility/safety with mobility;Proper use of DME OT goals addressed during session: ADL's and self-care       AM-PAC PT "6 Clicks" Mobility  Outcome Measure Help needed turning from your back to your  side while in a flat bed without using bedrails?: A Little Help needed moving from lying on your back to sitting on the side of a flat bed without using bedrails?: A Little Help needed moving to and from a bed to a chair (including a wheelchair)?: A Little Help needed standing up from a chair using your arms (e.g., wheelchair or bedside chair)?: A Little Help needed to walk in hospital room?: A Little Help needed climbing 3-5 steps with a railing? : A Little 6 Click Score: 18    End of Session Equipment Utilized During Treatment: Gait belt;Oxygen Activity Tolerance: Patient tolerated treatment well Patient left: in chair;with call bell/phone within reach;with chair alarm set Nurse Communication: Mobility status PT Visit Diagnosis: Other abnormalities of gait and mobility (R26.89);Unsteadiness on feet (R26.81);Difficulty in walking, not elsewhere classified (R26.2)    Time: 5364-6803 PT Time Calculation (min) (ACUTE ONLY): 28 min   Charges:   PT Evaluation $PT Eval Moderate Complexity: 1 Mod         Kipp Brood, PT, DPT, [facility] Physical Therapist with [facility]  07/10/2019 1:32 PM

## 2019-07-11 LAB — TROPONIN I (HIGH SENSITIVITY)
Troponin I (High Sensitivity): 6 ng/L (ref <18)
Troponin I (High Sensitivity): 6 ng/L (ref <18)

## 2019-07-11 LAB — BASIC METABOLIC PANEL
Anion gap: 5 (ref 5–15)
BUN: 16 mg/dL (ref 8–23)
CO2: 31 mmol/L (ref 22–32)
Calcium: 8.1 mg/dL — ABNORMAL LOW (ref 8.9–10.3)
Chloride: 102 mmol/L (ref 98–111)
Creatinine, Ser: 0.94 mg/dL (ref 0.44–1.00)
GFR calc Af Amer: >60 mL/min (ref >60)
GFR calc non Af Amer: 56 mL/min — ABNORMAL LOW (ref >60)
Glucose, Bld: 128 mg/dL — ABNORMAL HIGH (ref 70–99)
Potassium: 3.8 mmol/L (ref 3.5–5.1)
Sodium: 138 mmol/L (ref 135–145)

## 2019-07-11 LAB — CBC
HCT: 25.8 % — ABNORMAL LOW (ref 36.0–46.0)
Hemoglobin: 7.6 g/dL — ABNORMAL LOW (ref 12.0–15.0)
MCH: 24.8 pg — ABNORMAL LOW (ref 26.0–34.0)
MCHC: 29.5 g/dL — ABNORMAL LOW (ref 30.0–36.0)
MCV: 84 fL (ref 80.0–100.0)
Platelets: 274 10*3/uL (ref 150–400)
RBC: 3.07 MIL/uL — ABNORMAL LOW (ref 3.87–5.11)
RDW: 20.1 % — ABNORMAL HIGH (ref 11.5–15.5)
WBC: 10.8 10*3/uL — ABNORMAL HIGH (ref 4.0–10.5)
nRBC: 0 % (ref 0.0–0.2)

## 2019-07-11 LAB — D-DIMER, QUANTITATIVE: D-Dimer, Quant: 0.41 ug/mL-FEU (ref 0.00–0.50)

## 2019-07-11 MED ORDER — GABAPENTIN 300 MG PO CAPS
300.0000 mg | ORAL_CAPSULE | Freq: Three times a day (TID) | ORAL | Status: DC
  Administered 2019-07-11 – 2019-07-12 (×4): 300 mg via ORAL
  Filled 2019-07-11 (×4): qty 1

## 2019-07-11 MED ORDER — BENZONATATE 100 MG PO CAPS
100.0000 mg | ORAL_CAPSULE | Freq: Three times a day (TID) | ORAL | Status: DC
  Administered 2019-07-11 – 2019-07-12 (×4): 100 mg via ORAL
  Filled 2019-07-11 (×4): qty 1

## 2019-07-11 MED ORDER — MORPHINE SULFATE (PF) 2 MG/ML IV SOLN
2.0000 mg | INTRAVENOUS | Status: DC | PRN
  Administered 2019-07-11 – 2019-07-12 (×2): 2 mg via INTRAVENOUS
  Filled 2019-07-11 (×3): qty 1

## 2019-07-11 MED ORDER — HYDROCODONE-ACETAMINOPHEN 5-325 MG PO TABS
1.0000 | ORAL_TABLET | Freq: Four times a day (QID) | ORAL | Status: DC | PRN
  Administered 2019-07-11 – 2019-07-12 (×5): 1 via ORAL
  Filled 2019-07-11 (×5): qty 1

## 2019-07-11 NOTE — Progress Notes (Signed)
[facility] Progress Note  Patient: [name] TKZ:[id number]   PCP: Asencion Noble, MD DOB: [birth year]   DOA: 07/09/2019   DOS: 07/11/2019   Date of Service: the patient was seen and examined on 07/11/2019  Chief Complaint  Patient presents with  . Fatigue  . Altered Mental Status  . Nasal Congestion   Brief hospital course: Pt. with PMH of COPD, chronic respiratory failure on 2 LPM, left ovarian cancer on chemotherapy; presented with complain of shortness of breath, was found to have COPD exacerbation and lobar pneumonia.  Currently further plan is continue IV antibiotics and monitor.  Subjective: Patient reports pain on her left side.  Also reports right rib cage pain.  Continues to have cough.  Mild worsening of shortness of breath more than her baseline.  No dizziness no lightheadedness.  No nausea no vomiting.  No choking episode.  No diarrhea no constipation.  Assessment and Plan: 1.  Right lobar pneumonia Chronic bilateral pleural effusion. COPD with mild acute exacerbation. Chronic hypoxic respiratory failure Pleuritic chest pain  Continue with IV ceftriaxone and azithromycin. Follow-up on cultures. Continue Mucinex, continue oral prednisone and inhalers. Patient reports pleuritic chest pain.  EKG unremarkable.  Troponin unremarkable.  D-dimer unremarkable.  Treat with morphine.  2.  Hypothyroidism. Continue Synthroid.  3.  Mood disorder Chronic pain Continue Xanax, Cymbalta, gabapentin, Remeron. Continue PRN morphine and Norco.  4.  A. fib, paroxysmal Continue Xarelto, Cardizem.  Currently rate controlled.  Diet: Cardiac diet  DVT Prophylaxis: Therapeutic Anticoagulation with Xarelto  Advance goals of care discussion: Full code  Family Communication: no family was present at bedside, at the time of interview.  Patient wants me to contact her daughter. We will contact later this afternoon. satisfactorily.   Disposition:  Discharge to Home likely  tomorrow.  Consultants: none Procedures: none  Scheduled Meds: . benzonatate  100 mg Oral TID  . Chlorhexidine Gluconate Cloth  6 each Topical Daily  . dicyclomine  10 mg Oral BID  . diltiazem  180 mg Oral BID  . DULoxetine  60 mg Oral QHS  . feeding supplement (ENSURE ENLIVE)  237 mL Oral Q1500  . furosemide  40 mg Oral Daily  . gabapentin  300 mg Oral TID  . guaiFENesin  600 mg Oral BID  . levothyroxine  137 mcg Oral QAC breakfast  . mirtazapine  15 mg Oral QHS  . potassium chloride SA  20 mEq Oral Daily  . predniSONE  40 mg Oral Q breakfast  . Rivaroxaban  15 mg Oral Q breakfast  . sodium chloride flush  20 mL Intravenous Q12H  . sodium chloride flush  3 mL Intravenous Q12H  . umeclidinium-vilanterol  1 puff Inhalation Daily   Continuous Infusions: . sodium chloride    . azithromycin Stopped (07/10/19 1909)  . cefTRIAXone (ROCEPHIN)  IV 2 g (07/11/19 1425)   PRN Meds: sodium chloride, acetaminophen **OR** acetaminophen, albuterol, ALPRAZolam, azelastine, fluticasone, HYDROcodone-acetaminophen, morphine injection, polyvinyl alcohol, sodium chloride flush Antibiotics: Anti-infectives (From admission, onward)   Start     Dose/Rate Route Frequency Ordered Stop   07/10/19 1800  azithromycin (ZITHROMAX) 500 mg in sodium chloride 0.9 % 250 mL IVPB     500 mg 250 mL/hr over 60 Minutes Intravenous Every 24 hours 07/09/19 2011 07/14/19 1759   07/10/19 1400  cefTRIAXone (ROCEPHIN) 2 g in sodium chloride 0.9 % 100 mL IVPB     2 g 200 mL/hr over 30 Minutes Intravenous Every 24 hours 07/09/19 2011  07/15/19 1359   07/09/19 1600  cefTRIAXone (ROCEPHIN) 1 g in sodium chloride 0.9 % 100 mL IVPB     1 g 200 mL/hr over 30 Minutes Intravenous  Once 07/09/19 1553 07/09/19 1720   07/09/19 1600  azithromycin (ZITHROMAX) 500 mg in sodium chloride 0.9 % 250 mL IVPB     500 mg 250 mL/hr over 60 Minutes Intravenous  Once 07/09/19 1553 07/09/19 1826       Objective: Physical Exam: Vitals:    07/11/19 0556 07/11/19 0741 07/11/19 0744 07/11/19 1341  BP: 130/62   (!) 131/57  Pulse: 87   90  Resp: 17   16  Temp: 98.4 F (36.9 C)   98.5 F (36.9 C)  TempSrc: Oral   Oral  SpO2: 99% 94% 94% 97%  Height:        Intake/Output Summary (Last 24 hours) at 07/11/2019 1605 Last data filed at 07/11/2019 1300 Gross per 24 hour  Intake 1093.67 ml  Output 100 ml  Net 993.67 ml   There were no vitals filed for this visit. General: alert and oriented to time, place, and person. Appear in mild distress, affect appropriate Eyes: PERRL, Conjunctiva normal ENT: Oral Mucosa Clear, moist  Neck: no JVD, no Abnormal Mass Or lumps Cardiovascular: S1 and S2 Present, no Murmur, peripheral pulses symmetrical Respiratory: good respiratory effort, Bilateral Air entry equal and Decreased, no signs of accessory muscle use, bilateral  Crackles, no wheezes Abdomen: Bowel Sound present, Soft and no tenderness, no hernia Skin: no rashes  Extremities: no Pedal edema, no calf tenderness Neurologic: without any new focal findings Gait not checked due to patient safety concerns  Data Reviewed: I have personally reviewed and interpreted daily labs, tele strips, imagings as discussed above. I reviewed all nursing notes, pharmacy notes, vitals, pertinent old records I have discussed plan of care as described above with RN and patient/family.  CBC: Recent Labs  Lab 07/09/19 1416 07/10/19 0614 07/11/19 0500  WBC 17.6* 12.5* 10.8*  NEUTROABS 14.1*  --   --   HGB 8.7* 8.0* 7.6*  HCT 29.4* 27.3* 25.8*  MCV 84.5 85.0 84.0  PLT 256 235 710   Basic Metabolic Panel: Recent Labs  Lab 07/09/19 1416 07/10/19 0614 07/11/19 0500  NA 131* 135 138  K 4.0 3.9 3.8  CL 91* 97* 102  CO2 31 30 31   GLUCOSE 110* 106* 128*  BUN 21 17 16   CREATININE 0.99 0.86 0.94  CALCIUM 8.4* 8.2* 8.1*    Liver Function Tests: Recent Labs  Lab 07/09/19 1416  AST 13*  ALT 15  ALKPHOS 56  BILITOT 0.9  PROT 6.0*   ALBUMIN 3.0*   No results for input(s): LIPASE, AMYLASE in the last 168 hours. No results for input(s): AMMONIA in the last 168 hours. Coagulation Profile: No results for input(s): INR, PROTIME in the last 168 hours. Cardiac Enzymes: No results for input(s): CKTOTAL, CKMB, CKMBINDEX, TROPONINI in the last 168 hours. BNP (last 3 results) No results for input(s): PROBNP in the last 8760 hours. CBG: No results for input(s): GLUCAP in the last 168 hours. Studies: No results found.   Time spent: 35 minutes  Author: Berle Mull, MD [facility] 07/11/2019 4:05 PM  To reach On-call, see care teams to locate the attending and reach out to them via [email or web address]. If 7PM-7AM, please contact night-coverage If you still have difficulty reaching the attending provider, please page the [facility] (Director on Call) for [facility] on amion for assistance.

## 2019-07-11 NOTE — Progress Notes (Signed)
Physical Therapy Treatment Patient Details Name: [name] MRN: [id number] DOB: 01/30/1933 Today's Date: 07/11/2019    History of Present Illness Pt is a 85 yo female admitted for COPD exacerbation and pneumonia.  Pt with h/o ovarian CA currently receiving chemo, chronic respiratory failure and Afib.    PT Comments    Pt able to increase ambulate farther without sitting rest break today. Con't to recommend HHPT.   Follow Up Recommendations  Home health PT     Equipment Recommendations  None recommended by PT    Recommendations for Other Services       Precautions / Restrictions Precautions Precautions: Fall Precaution Comments: Pt has O2 at home with lines.  Has not taken a fall in years.  Restrictions Weight Bearing Restrictions: No    Mobility  Bed Mobility Overal bed mobility: Needs Assistance Bed Mobility: Sit to Supine       Sit to supine: Supervision      Transfers Overall transfer level: Needs assistance Equipment used: Rolling walker (2 wheeled) Transfers: Sit to/from Stand Sit to Stand: Min guard         General transfer comment: Cues for hand placement and min/guard for steadying  Ambulation/Gait Ambulation/Gait assistance: Min guard Gait Distance (Feet): 110 Feet Assistive device: Rolling walker (2 wheeled) Gait Pattern/deviations: Step-through pattern;Decreased stride length;Trunk flexed Gait velocity: decreased   General Gait Details: Pt did not need a sitting rest break today with gait.  O2 intact at 2 L/min. Pt with 3/4 dyspnea.   Stairs             Wheelchair Mobility    Modified Rankin (Stroke Patients Only)       Balance   Sitting-balance support: Feet supported Sitting balance-Leahy Scale: Good     Standing balance support: During functional activity;Bilateral upper extremity supported Standing balance-Leahy Scale: Poor Standing balance comment: requires UE support                             Cognition Arousal/Alertness: Awake/alert Behavior During Therapy: WFL for tasks assessed/performed Overall Cognitive Status: Within Functional Limits for tasks assessed                                        Exercises      General Comments        Pertinent Vitals/Pain Pain Assessment: Faces Faces Pain Scale: Hurts even more Pain Location: Rt side Pain Descriptors / Indicators: Aching Pain Intervention(s): Monitored during session;Repositioned    Home Living                      Prior Function            PT Goals (current goals can now be found in the care plan section) Acute Rehab PT Goals Patient Stated Goal: to go home but be able to breathe better PT Goal Formulation: With patient Time For Goal Achievement: 07/24/19 Potential to Achieve Goals: Good Progress towards PT goals: Progressing toward goals    Frequency    Min 3X/week      PT Plan Current plan remains appropriate    Co-evaluation              AM-PAC PT "6 Clicks" Mobility   Outcome Measure  Help needed turning from your back to your side while in a flat  bed without using bedrails?: A Little Help needed moving from lying on your back to sitting on the side of a flat bed without using bedrails?: A Little Help needed moving to and from a bed to a chair (including a wheelchair)?: A Little Help needed standing up from a chair using your arms (e.g., wheelchair or bedside chair)?: A Little Help needed to walk in hospital room?: A Little Help needed climbing 3-5 steps with a railing? : A Little 6 Click Score: 18    End of Session Equipment Utilized During Treatment: Gait belt;Oxygen Activity Tolerance: Patient tolerated treatment well Patient left: in bed;with call bell/phone within reach;with bed alarm set Nurse Communication: Mobility status;Other (comment)(request for inhaler) PT Visit Diagnosis: Other abnormalities of gait and mobility (R26.89);Unsteadiness on feet  (R26.81);Difficulty in walking, not elsewhere classified (R26.2)     Time: [id number] PT Time Calculation (min) (ACUTE ONLY): 16 min  Charges:  $Gait Training: 8-22 mins                     Jaquavious Mercer L. Tamala Julian, [place] Pager [phone] 07/11/2019    Galen Manila 07/11/2019, 2:26 PM

## 2019-07-12 LAB — CBC
HCT: 27.7 % — ABNORMAL LOW (ref 36.0–46.0)
Hemoglobin: 8.1 g/dL — ABNORMAL LOW (ref 12.0–15.0)
MCH: 24.9 pg — ABNORMAL LOW (ref 26.0–34.0)
MCHC: 29.2 g/dL — ABNORMAL LOW (ref 30.0–36.0)
MCV: 85.2 fL (ref 80.0–100.0)
Platelets: 305 10*3/uL (ref 150–400)
RBC: 3.25 MIL/uL — ABNORMAL LOW (ref 3.87–5.11)
RDW: 20 % — ABNORMAL HIGH (ref 11.5–15.5)
WBC: 9.6 10*3/uL (ref 4.0–10.5)
nRBC: 0 % (ref 0.0–0.2)

## 2019-07-12 LAB — BASIC METABOLIC PANEL
Anion gap: 6 (ref 5–15)
BUN: 14 mg/dL (ref 8–23)
CO2: 32 mmol/L (ref 22–32)
Calcium: 8.3 mg/dL — ABNORMAL LOW (ref 8.9–10.3)
Chloride: 102 mmol/L (ref 98–111)
Creatinine, Ser: 0.82 mg/dL (ref 0.44–1.00)
GFR calc Af Amer: >60 mL/min (ref >60)
GFR calc non Af Amer: >60 mL/min (ref >60)
Glucose, Bld: 108 mg/dL — ABNORMAL HIGH (ref 70–99)
Potassium: 4 mmol/L (ref 3.5–5.1)
Sodium: 140 mmol/L (ref 135–145)

## 2019-07-12 MED ORDER — PREDNISONE 20 MG PO TABS: 40.0000 mg | ORAL_TABLET | Freq: Every day | ORAL | 0 refills | Status: AC

## 2019-07-12 MED ORDER — CEFDINIR 300 MG PO CAPS: 300.0000 mg | ORAL_CAPSULE | Freq: Two times a day (BID) | ORAL | 0 refills | Status: AC

## 2019-07-12 MED ORDER — HEPARIN SOD (PORK) LOCK FLUSH 100 UNIT/ML IV SOLN
500.0000 [IU] | INTRAVENOUS | Status: DC
  Filled 2019-07-12: qty 5

## 2019-07-12 MED ORDER — HEPARIN SOD (PORK) LOCK FLUSH 100 UNIT/ML IV SOLN: 500.0000 [IU] | INTRAVENOUS | Status: DC | PRN

## 2019-07-12 MED ORDER — AZITHROMYCIN 500 MG PO TABS: 500.0000 mg | ORAL_TABLET | Freq: Every day | ORAL | 0 refills | Status: AC

## 2019-07-12 NOTE — Care Management Note (Signed)
Case Management Note  Patient Details  Name: [name] MRN: [id number] Date of Birth: [birth year]  Subjective/Objective:                    Action/Plan:   Expected Discharge Date:  07/12/19               Expected Discharge Plan:     In-House Referral:     Discharge planning Services     Post Acute Care Choice:    Choice offered to:     DME Arranged:    DME Agency:     HH Arranged:    [place] Agency:     Status of Service:     If discussed at [employer] of Stay Meetings, dates discussed:    Additional Comments:  Norina Buzzard, RN 07/12/2019, 3:38 PM

## 2019-07-12 NOTE — Progress Notes (Signed)
Met with pt and daughter. Pt plans to return home with the support of her daughter. Daughter stated that pt has a someone to help her q day for 5 hrs. Pt has a 3-in-1 BSC, RW, cane, and a W/C. Discussed HHPT referral and preference for a HH agency. They report that they don't have a preference. Discussed [facility] and [facility]. They agreed with [facility]. Contacted Melissa at [facility] for referral. 

## 2019-07-12 NOTE — Discharge Instructions (Signed)
Community-Acquired Pneumonia, Adult Pneumonia is an infection of the lungs. It causes swelling in the airways of the lungs. Mucus and fluid may also build up inside the airways. One type of pneumonia can happen while a person is in a hospital. A different type can happen when a person is not in a hospital (community-acquired pneumonia).  What are the causes?  This condition is caused by germs (viruses, bacteria, or fungi). Some types of germs can be passed from one person to another. This can happen when you breathe in droplets from the cough or sneeze of an infected person. What increases the risk? You are more likely to develop this condition if you:  Have a long-term (chronic) disease, such as: ? Chronic obstructive pulmonary disease (COPD). ? Asthma. ? Cystic fibrosis. ? Congestive heart failure. ? Diabetes. ? Kidney disease.  Have HIV.  Have sickle cell disease.  Have had your spleen removed.  Do not take good care of your teeth and mouth (poor dental hygiene).  Have a medical condition that increases the risk of breathing in droplets from your own mouth and nose.  Have a weakened body defense system (immune system).  Are a smoker.  Travel to areas where the germs that cause this illness are common.  Are around certain animals or the places they live. What are the signs or symptoms?  A dry cough.  A wet (productive) cough.  Fever.  Sweating.  Chest pain. This often happens when breathing deeply or coughing.  Fast breathing or trouble breathing.  Shortness of breath.  Shaking chills.  Feeling tired (fatigue).  Muscle aches. How is this treated? Treatment for this condition depends on many things. Most adults can be treated at home. In some cases, treatment must happen in a hospital. Treatment may include:  Medicines given by mouth or through an IV tube.  Being given extra oxygen.  Respiratory therapy. In rare cases, treatment for very bad pneumonia  may include:  Using a machine to help you breathe.  Having a procedure to remove fluid from around your lungs. Follow these instructions at home: Medicines  Take over-the-counter and prescription medicines only as told by your doctor. ? Only take cough medicine if you are losing sleep.  If you were prescribed an antibiotic medicine, take it as told by your doctor. Do not stop taking the antibiotic even if you start to feel better. General instructions   Sleep with your head and neck raised (elevated). You can do this by sleeping in a recliner or by putting a few pillows under your head.  Rest as needed. Get at least 8 hours of sleep each night.  Drink enough water to keep your pee (urine) pale yellow.  Eat a healthy diet that includes plenty of vegetables, fruits, whole grains, low-fat dairy products, and lean protein.  Do not use any products that contain nicotine or tobacco. These include cigarettes, e-cigarettes, and chewing tobacco. If you need help quitting, ask your doctor.  Keep all follow-up visits as told by your doctor. This is important. How is this prevented? A shot (vaccine) can help prevent pneumonia. Shots are often suggested for:  People older than 83 years of age.  People older than 83 years of age who: ? Are having cancer treatment. ? Have long-term (chronic) lung disease. ? Have problems with their body's defense system. You may also prevent pneumonia if you take these actions:  Get the flu (influenza) shot every year.  Go to the dentist as  often as told.  Wash your hands often. If you cannot use soap and water, use hand sanitizer. Contact a doctor if:  You have a fever.  You lose sleep because your cough medicine does not help. Get help right away if:  You are short of breath and it gets worse.  You have more chest pain.  Your sickness gets worse. This is very serious if: ? You are an older adult. ? Your body's defense system is weak.  You  cough up blood. Summary  Pneumonia is an infection of the lungs.  Most adults can be treated at home. Some will need treatment in a hospital.  Drink enough water to keep your pee pale yellow.  Get at least 8 hours of sleep each night. This information is not intended to replace advice given to you by your health care provider. Make sure you discuss any questions you have with your health care provider. Document Released: 03/12/2008 Document Revised: 01/14/2019 Document Reviewed: 05/22/2018 Elsevier Patient Education  2020 [employer].

## 2019-07-12 NOTE — Discharge Summary (Signed)
Physician Discharge Summary  [name] PNT:[id number] DOB: [birth year] DOA: 07/09/2019  PCP: Asencion Noble, MD  Admit date: 07/09/2019 Discharge date: 07/12/2019  Admitted From: Home Disposition:  Home  Recommendations for Outpatient Follow-up:  1. Follow up with PCP in 1 week 2. Continue antibiotics with azithromycin and cefdinir for pneumonia 3. 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  Admitted for pneumonia.  Patient reports she started developing increased shortness of breath yesterday with productive cough.  No specific aggravating or alleviating factors.  She had a tele-visit yesterday with her pulmonologist and was prescribed doxycycline and prednisone for COPD exacerbation.  Woke up this morning feeling worse.  No chest pain.  Hospital course:  Right lobar pneumonia chronic bilateral pleural effusions Patient presented with progressive shortness of breath despite recent treatment for COPD exacerbation with doxycycline and prednisone.  On presentation, patient with an elevated WBC count of 17.6.  Chest x-ray notable for increased right pleural effusion and right basilar atelectasis versus infiltrate.  Patient was started on IV azithromycin and ceftriaxone.  Patient symptoms resolved with resolution of her leukocytosis.  Patient will continue antibiotics with azithromycin to complete a 5-day course and cefdinir 300 mg p.o. twice daily to complete a 7-day course.  Chronic  hypoxic respiratory failure, COPD Patient was started on doxycycline and prednisone day prior to ED presentation for COPD exacerbation.  Patient on 2 L nasal cannula at baseline at home.  Initially requiring slightly higher oxygen concentrations on admission likely secondary to pneumonia as above.  Patient was continued on prednisone 40 mg p.o. daily and azithromycin/ceftriaxone as above.  Patient was supported with duo nebs as needed.  Continue to complete a 5-day course of prednisone and antibiotics as above.  Continue home stioloto Respimat inhaler.  Left ovarian epithelial cancer Patient last seen in the oncology office on 06/26/2019 for history of left ovarian epithelial cancer.  She has had multiple side effects related to chemotherapy despite poor quality of life; patient wishes to continue on treatment.  Oncology plans to continue treatment every other week along with tumor marker monitoring and CT scans every 3 months.  Ultimately treatment goal is palliative.  Continue outpatient follow-up with oncology.  Anemia of chronic disease, multifactorial including chronic kidney disease and anemia of chronic disease from treatment Hemoglobin stable, 8.1 at time of discharge.  Postherpetic neuralgia, severe Continue gabapentin, MS Contin and MSIR  Paroxysmal atrial fibrillation Not on anticoagulation secondary to GI bleed 2019, Continue Cardizem.  Rate controlled.  Discharge Diagnoses:  Principal Problem:   Lobar pneumonia ([place]) Active Problems:   COPD (chronic obstructive pulmonary disease) (HCC)   Left ovarian epithelial cancer (HCC)   Anemia, chronic disease   Chronic respiratory failure with hypoxia (HCC)   Atrial fibrillation [facility])    Discharge Instructions  Discharge Instructions    Call MD for:  difficulty breathing, headache or visual disturbances   Complete by: As directed    Call MD for:  extreme fatigue   Complete by: As directed    Call MD for:  persistant dizziness  or light-headedness   Complete by: As directed    Call MD for:  persistant nausea and vomiting   Complete by: As directed    Call MD for:  redness, tenderness, or signs of infection (pain, swelling, redness, odor or green/yellow discharge around incision site)   Complete by: As directed    Call MD for:  severe uncontrolled pain   Complete by: As directed    Call MD for:  temperature >100.4   Complete by: As directed    Diet - low sodium heart healthy   Complete by: As directed    Increase activity slowly   Complete by: As directed      Allergies as of 07/12/2019      Reactions   Codeine Itching      Medication List    STOP taking these medications   doxycycline 100 MG tablet Commonly known as: VIBRA-TABS   valACYclovir 1000 MG tablet Commonly known as: VALTREX     TAKE these medications   albuterol 108 (90 Base) MCG/ACT inhaler Commonly known as: ProAir HFA Inhale 2 puffs into the lungs every 6 (six) hours as needed for wheezing or shortness of breath.   ALPRAZolam 0.25 MG tablet Commonly known as: XANAX Take 1 tablet (0.25 mg total) by mouth at bedtime as needed for anxiety.   azelastine 0.1 % nasal spray Commonly known as: ASTELIN Place 2 sprays into both nostrils 2 (two) times daily as needed for allergies.   azithromycin 500 MG tablet Commonly known as: Zithromax Take 1 tablet (500 mg total) by mouth daily for 3 days. Take 1 tablet daily for 3 days.   cefdinir 300 MG capsule Commonly known as: OMNICEF Take 1 capsule (300 mg total) by mouth 2 (two) times daily for 5 days.   dicyclomine 10 MG capsule Commonly known as: BENTYL TAKE 1 CAPSULE(10 MG) BY MOUTH TWICE DAILY What changed: See the new instructions.   diltiazem 180 MG 24 hr capsule Commonly known as: CARDIZEM CD Take 1 capsule (180 mg total) by mouth 2 (two) times a day.   diphenhydrAMINE 25 mg capsule Commonly known as: BENADRYL Take 25 mg by mouth every 8 (eight) hours as needed for itching.    DULoxetine 60 MG capsule Commonly known as: CYMBALTA Take 1 capsule (60 mg total) by mouth at bedtime.   fluticasone 50 MCG/ACT nasal spray Commonly known as: FLONASE Place 2 sprays into both nostrils 2 (two) times daily as needed (FOR NASAL CONGESTION.).   furosemide 40 MG tablet Commonly known as: LASIX Take 1 tablet (40 mg total) by mouth daily.   gabapentin 300 MG capsule Commonly known as: NEURONTIN Take 1 capsule (300 mg total) by mouth at bedtime.   guaiFENesin 600 MG 12 hr tablet Commonly known as: Mucinex Take 1 tablet (600 mg total) by mouth 2 (two) times daily.   halobetasol 0.05 % cream Commonly known as: ULTRAVATE Apply 1 application topically 2 (two) times daily as needed (psoriasis).   hydrocortisone valerate cream 0.2 % Commonly known as: WESTCORT Apply 1 application topically 2 (two) times daily.   levothyroxine 137 MCG tablet Commonly known as: SYNTHROID Take 137 mcg by mouth daily before breakfast. For thyroid therapy   Magnesium 400 MG Caps Take 400 mg by mouth daily.   metoprolol tartrate 25 MG tablet Commonly known as: LOPRESSOR Take 1 tablet (25 mg total) by mouth 2 (two) times daily as needed. What changed:   when to take this  reasons to take this  mirtazapine 15 MG tablet Commonly known as: REMERON Take 1 tablet (15 mg total) by mouth at bedtime.   morphine 15 MG 12 hr tablet Commonly known as: MS CONTIN Take 1 tablet (15 mg total) by mouth every 12 (twelve) hours.   morphine 15 MG tablet Commonly known as: MSIR Take 1 tablet (15 mg total) by mouth every 4 (four) hours as needed for severe pain.   polyvinyl alcohol 1.4 % ophthalmic solution Commonly known as: LIQUIFILM TEARS Place 1 drop into both eyes 2 (two) times daily as needed for dry eyes.   potassium chloride SA 20 MEQ tablet Commonly known as: KLOR-CON TAKE 1 TABLET BY MOUTH DAILY AND TAKE 1 ADDITIONAL TABLET WHEN TAKING EXTRA LASIX DOSE What changed: See the new  instructions.   predniSONE 20 MG tablet Commonly known as: DELTASONE Take 2 tablets (40 mg total) by mouth daily with breakfast for 2 days. Start taking on: July 13, 2019   Rivaroxaban 15 MG Tabs tablet Commonly known as: XARELTO Take 15 mg by mouth daily.   Stiolto Respimat 2.5-2.5 MCG/ACT Aers Generic drug: Tiotropium Bromide-Olodaterol Inhale 2 puffs into the lungs daily.   Vitamin D 50 MCG (2000 UT) Caps Take 2,000 Units by mouth daily.      Follow-up Information    Asencion Noble, MD. Schedule an appointment as soon as possible for a visit in 1 week(s).   Specialty: Internal Medicine Contact information: [address] [place] [address] [phone]        Satira Sark, MD .   Specialty: Cardiology Contact information: [place] [address] [phone]          Allergies  Allergen Reactions  . Codeine Itching    Consultations:  none   Procedures/Studies: Dg Chest Port 1 View  Result Date: 07/09/2019 CLINICAL DATA:  Shortness of breath, cough EXAM: PORTABLE CHEST 1 VIEW COMPARISON:  05/19/2019 FINDINGS: Right Port-A-Cath is in place, unchanged. Small bilateral pleural effusions, right greater than left. Bibasilar atelectasis or infiltrates, also greater on the right. Findings have worsened on the right since prior study. Heart is normal size. IMPRESSION: Increasing right pleural effusion and right basilar atelectasis or infiltrate. Small left effusion and left base atelectasis. Electronically Signed   By: Rolm Baptise M.D.   On: 07/09/2019 15:46      Subjective: Patient seen and examined at bedside, sitting in bedside chair eating breakfast.  No complaints.  Actually off of oxygen and oxygenating well.  Ready for discharge home.  Denies headache, no visual changes, no chest pain, no palpitations, no shortness of breath, no nauseous vomiting/diarrhea, no abdominal pain, no weakness, no cough/congestion, no paresthesias.   No acute events overnight per nursing staff.  Discharge Exam: Vitals:   07/12/19 0746 07/12/19 0749  BP:    Pulse:    Resp:    Temp:    SpO2: 99% 99%   Vitals:   07/11/19 2101 07/12/19 0517 07/12/19 0746 07/12/19 0749  BP: (!) 122/59 (!) 127/50    Pulse: 88 90    Resp: 15 12    Temp: 98.2 F (36.8 C) 97.7 F (36.5 C)    TempSrc: Oral Oral    SpO2: 97% 99% 99% 99%  Height:        General: Pt is alert, awake, not in acute distress Cardiovascular: RRR, S1/S2 +, no rubs, no gallops Respiratory: CTA bilaterally, no wheezing, no rhonchi Abdominal: Soft, NT, ND, bowel sounds + Extremities: no edema, no  cyanosis    The results of significant diagnostics from this hospitalization (including imaging, microbiology, ancillary and laboratory) are listed below for reference.     Microbiology: Recent Results (from the past 240 hour(s))  SARS Coronavirus 2 [facility] order, Performed in [facility] hospital lab) Nasopharyngeal Nasopharyngeal Swab     Status: None   Collection Time: 07/09/19  2:16 PM   Specimen: Nasopharyngeal Swab  Result Value Ref Range Status   SARS Coronavirus 2 NEGATIVE NEGATIVE Final    Comment: (NOTE) If result is NEGATIVE SARS-CoV-2 target nucleic acids are NOT DETECTED. The SARS-CoV-2 RNA is generally detectable in upper and lower  respiratory specimens during the acute phase of infection. The lowest  concentration of SARS-CoV-2 viral copies this assay can detect is 250  copies / mL. A negative result does not preclude SARS-CoV-2 infection  and should not be used as the sole basis for treatment or other  patient management decisions.  A negative result may occur with  improper specimen collection / handling, submission of specimen other  than nasopharyngeal swab, presence of viral mutation(s) within the  areas targeted by this assay, and inadequate number of viral copies  (<250 copies / mL). A negative result must be combined with clinical   observations, patient history, and epidemiological information. If result is POSITIVE SARS-CoV-2 target nucleic acids are DETECTED. The SARS-CoV-2 RNA is generally detectable in upper and lower  respiratory specimens dur ing the acute phase of infection.  Positive  results are indicative of active infection with SARS-CoV-2.  Clinical  correlation with patient history and other diagnostic information is  necessary to determine patient infection status.  Positive results do  not rule out bacterial infection or co-infection with other viruses. If result is PRESUMPTIVE POSTIVE SARS-CoV-2 nucleic acids MAY BE PRESENT.   A presumptive positive result was obtained on the submitted specimen  and confirmed on repeat testing.  While 2019 novel coronavirus  (SARS-CoV-2) nucleic acids may be present in the submitted sample  additional confirmatory testing may be necessary for epidemiological  and / or clinical management purposes  to differentiate between  SARS-CoV-2 and other Sarbecovirus currently known to infect humans.  If clinically indicated additional testing with an alternate test  methodology [phone]) is advised. The SARS-CoV-2 RNA is generally  detectable in upper and lower respiratory sp ecimens during the acute  phase of infection. The expected result is Negative. Fact Sheet for Patients:  StrictlyIdeas.no Fact Sheet for Healthcare Providers: BankingDealers.co.za This test is not yet approved or cleared by the [place] FDA and has been authorized for detection and/or diagnosis of SARS-CoV-2 by FDA under an Emergency Use Authorization (EUA).  This EUA will remain in effect (meaning this test can be used) for the duration of the COVID-19 declaration under Section 564(b)(1) of the Act, 21 U.S.C. section 360bbb-3(b)(1), unless the authorization is terminated or revoked sooner. Performed at [facility], [place]  [address], [place], [place] [address]   Culture, sputum-assessment     Status: None   Collection Time: 07/10/19 12:20 PM   Specimen: Sputum  Result Value Ref Range Status   Specimen Description SPU EXPECTORATED  Final   Special Requests NONE  Final   Sputum evaluation   Final    THIS SPECIMEN IS ACCEPTABLE FOR SPUTUM CULTURE Performed at [facility], [place] [address] [place], [place] [address]    Report Status 07/10/2019 FINAL  Final  Culture, respiratory     Status: None (Preliminary result)  Collection Time: 07/10/19 12:20 PM   Specimen: Sputum  Result Value Ref Range Status   Specimen Description   Final    SPU EXPECTORATED Performed at [place] [address], [place], [place] [address]    Special Requests   Final    NONE Reflexed from [id number] Performed at [facility], [place] [address] [place], [place] [address]    Gram Stain   Final    ABUNDANT WBC PRESENT,BOTH PMN AND MONONUCLEAR MODERATE GRAM NEGATIVE RODS FEW GRAM VARIABLE ROD FEW GRAM POSITIVE COCCI RARE YEAST Performed at [facility], [place] [address], [place], [place] [address]    Culture FEW YEAST  Final   Report Status PENDING  Incomplete     Labs: BNP (last 3 results) Recent Labs    04/17/19 0918  BNP 956.2*   Basic Metabolic Panel: Recent Labs  Lab 07/09/19 1416 07/10/19 0614 07/11/19 0500 07/12/19 0500  NA 131* 135 138 140  K 4.0 3.9 3.8 4.0  CL 91* 97* 102 102  CO2 31 30 31  32  GLUCOSE 110* 106* 128* 108*  BUN 21 17 16 14   CREATININE 0.99 0.86 0.94 0.82  CALCIUM 8.4* 8.2* 8.1* 8.3*   Liver Function Tests: Recent Labs  Lab 07/09/19 1416  AST 13*  ALT 15  ALKPHOS 56  BILITOT 0.9  PROT 6.0*  ALBUMIN 3.0*   No results for input(s): LIPASE, AMYLASE in the last 168 hours. No results for input(s): AMMONIA in the last 168 hours. CBC: Recent Labs  Lab 07/09/19 1416 07/10/19 0614 07/11/19 0500 07/12/19 0500  WBC  17.6* 12.5* 10.8* 9.6  NEUTROABS 14.1*  --   --   --   HGB 8.7* 8.0* 7.6* 8.1*  HCT 29.4* 27.3* 25.8* 27.7*  MCV 84.5 85.0 84.0 85.2  PLT 256 235 274 305   Cardiac Enzymes: No results for input(s): CKTOTAL, CKMB, CKMBINDEX, TROPONINI in the last 168 hours. BNP: Invalid input(s): POCBNP CBG: No results for input(s): GLUCAP in the last 168 hours. D-Dimer Recent Labs    07/11/19 1120  DDIMER 0.41   Hgb A1c No results for input(s): HGBA1C in the last 72 hours. Lipid Profile No results for input(s): CHOL, HDL, LDLCALC, TRIG, CHOLHDL, LDLDIRECT in the last 72 hours. Thyroid function studies No results for input(s): TSH, T4TOTAL, T3FREE, THYROIDAB in the last 72 hours.  Invalid input(s): FREET3 Anemia work up No results for input(s): VITAMINB12, FOLATE, FERRITIN, TIBC, IRON, RETICCTPCT in the last 72 hours. Urinalysis    Component Value Date/Time   COLORURINE YELLOW 04/26/2019 1729   APPEARANCEUR HAZY (A) 04/26/2019 1729   LABSPEC 1.009 04/26/2019 1729   LABSPEC 1.030 10/11/2017 1125   PHURINE 6.0 04/26/2019 1729   GLUCOSEU NEGATIVE 04/26/2019 1729   GLUCOSEU Negative 10/11/2017 1125   HGBUR MODERATE (A) 04/26/2019 1729   BILIRUBINUR NEGATIVE 04/26/2019 1729   BILIRUBINUR Negative 10/11/2017 1125   KETONESUR NEGATIVE 04/26/2019 1729   PROTEINUR NEGATIVE 04/26/2019 1729   UROBILINOGEN 0.2 10/11/2017 1125   NITRITE NEGATIVE 04/26/2019 1729   LEUKOCYTESUR MODERATE (A) 04/26/2019 1729   LEUKOCYTESUR Negative 10/11/2017 1125   Sepsis Labs Invalid input(s): PROCALCITONIN,  WBC,  LACTICIDVEN Microbiology Recent Results (from the past 240 hour(s))  SARS Coronavirus 2 [facility] order, Performed in [facility] hospital lab) Nasopharyngeal Nasopharyngeal Swab     Status: None   Collection Time: 07/09/19  2:16 PM   Specimen: Nasopharyngeal Swab  Result Value Ref Range Status   SARS Coronavirus  2 NEGATIVE NEGATIVE Final    Comment: (NOTE) If result is NEGATIVE SARS-CoV-2  target nucleic acids are NOT DETECTED. The SARS-CoV-2 RNA is generally detectable in upper and lower  respiratory specimens during the acute phase of infection. The lowest  concentration of SARS-CoV-2 viral copies this assay can detect is 250  copies / mL. A negative result does not preclude SARS-CoV-2 infection  and should not be used as the sole basis for treatment or other  patient management decisions.  A negative result may occur with  improper specimen collection / handling, submission of specimen other  than nasopharyngeal swab, presence of viral mutation(s) within the  areas targeted by this assay, and inadequate number of viral copies  (<250 copies / mL). A negative result must be combined with clinical  observations, patient history, and epidemiological information. If result is POSITIVE SARS-CoV-2 target nucleic acids are DETECTED. The SARS-CoV-2 RNA is generally detectable in upper and lower  respiratory specimens dur ing the acute phase of infection.  Positive  results are indicative of active infection with SARS-CoV-2.  Clinical  correlation with patient history and other diagnostic information is  necessary to determine patient infection status.  Positive results do  not rule out bacterial infection or co-infection with other viruses. If result is PRESUMPTIVE POSTIVE SARS-CoV-2 nucleic acids MAY BE PRESENT.   A presumptive positive result was obtained on the submitted specimen  and confirmed on repeat testing.  While 2019 novel coronavirus  (SARS-CoV-2) nucleic acids may be present in the submitted sample  additional confirmatory testing may be necessary for epidemiological  and / or clinical management purposes  to differentiate between  SARS-CoV-2 and other Sarbecovirus currently known to infect humans.  If clinically indicated additional testing with an alternate test  methodology [phone]) is advised. The SARS-CoV-2 RNA is generally  detectable in upper and lower  respiratory sp ecimens during the acute  phase of infection. The expected result is Negative. Fact Sheet for Patients:  StrictlyIdeas.no Fact Sheet for Healthcare Providers: BankingDealers.co.za This test is not yet approved or cleared by the [place] FDA and has been authorized for detection and/or diagnosis of SARS-CoV-2 by FDA under an Emergency Use Authorization (EUA).  This EUA will remain in effect (meaning this test can be used) for the duration of the COVID-19 declaration under Section 564(b)(1) of the Act, 21 U.S.C. section 360bbb-3(b)(1), unless the authorization is terminated or revoked sooner. Performed at [facility], [place] [address], [place], [place] [address]   Culture, sputum-assessment     Status: None   Collection Time: 07/10/19 12:20 PM   Specimen: Sputum  Result Value Ref Range Status   Specimen Description SPU EXPECTORATED  Final   Special Requests NONE  Final   Sputum evaluation   Final    THIS SPECIMEN IS ACCEPTABLE FOR SPUTUM CULTURE Performed at [facility], [place] [address], [place], [place] [address]    Report Status 07/10/2019 FINAL  Final  Culture, respiratory     Status: None (Preliminary result)   Collection Time: 07/10/19 12:20 PM   Specimen: Sputum  Result Value Ref Range Status   Specimen Description   Final    SPU EXPECTORATED Performed at [place] [address] [place], [place] [address]    Special Requests   Final    NONE Reflexed from [id number] Performed at [facility], [place] [address], [place],  [address]    Gram Stain   Final  ABUNDANT WBC PRESENT,BOTH PMN AND MONONUCLEAR MODERATE GRAM NEGATIVE RODS FEW GRAM VARIABLE ROD FEW GRAM POSITIVE COCCI RARE YEAST Performed at [facility], [place] [address] [place], [place] [address]    Culture FEW YEAST  Final   Report Status PENDING  Incomplete      Time coordinating discharge: Over 30 minutes  SIGNED:   Dajaun Goldring J [place] DO  [facility] 07/12/2019, 9:55 AM

## 2019-07-12 NOTE — Progress Notes (Signed)
Pt discharged home with daughter in stable condition. Discharge instructions given. Scripts sent to [employer] of choice. No immediate questions or concerns.

## 2019-07-13 ENCOUNTER — Ambulatory Visit: Payer: Medicare Other | Admitting: Cardiology

## 2019-07-13 ENCOUNTER — Telehealth: Payer: Self-pay | Admitting: Emergency Medicine

## 2019-07-13 ENCOUNTER — Inpatient Hospital Stay: Payer: Medicare Other

## 2019-07-13 ENCOUNTER — Other Ambulatory Visit: Payer: Self-pay | Admitting: *Deleted

## 2019-07-13 ENCOUNTER — Telehealth: Payer: Self-pay | Admitting: Hematology and Oncology

## 2019-07-13 ENCOUNTER — Telehealth (INDEPENDENT_AMBULATORY_CARE_PROVIDER_SITE_OTHER): Payer: Medicare Other | Admitting: Emergency Medicine

## 2019-07-13 ENCOUNTER — Telehealth: Payer: Self-pay

## 2019-07-13 ENCOUNTER — Encounter: Payer: Self-pay | Admitting: Emergency Medicine

## 2019-07-13 ENCOUNTER — Inpatient Hospital Stay: Payer: Medicare Other | Admitting: Hematology and Oncology

## 2019-07-13 DIAGNOSIS — J449 Chronic obstructive pulmonary disease, unspecified: Secondary | ICD-10-CM

## 2019-07-13 DIAGNOSIS — J90 Pleural effusion, not elsewhere classified: Secondary | ICD-10-CM

## 2019-07-13 NOTE — Telephone Encounter (Signed)
I will send scheduling msg to see her next week Friday 10/16

## 2019-07-13 NOTE — Telephone Encounter (Signed)
I sent new scheduling msg

## 2019-07-13 NOTE — Telephone Encounter (Signed)
Returned patient's phone call regarding an appointment, appointments cancelled like patient request. Called patient to reschedule.

## 2019-07-13 NOTE — Telephone Encounter (Signed)
[name] called and left a message to cancel today's appts. Her Mom was discharged from the hospital yesterday. She is not up to having treatment today due to not feeling good. She ask that appts be rescheduled.

## 2019-07-13 NOTE — Telephone Encounter (Signed)
Called spoke with [name] patient's daughter. She states patient went to ED on Thursday 10/1 just feeling worse after televisit on 9/30 with Derl Barrow NP.  [name] stated they took xrays and mentioned the fluid was worse. But patient was discharged on Sunday 10/4 and no other xray was done after the 10/1 chest xray. Daughter will be present for Mychart visit. One was set up with Dr. Lamonte Sakai for 2pm today 07/13/19.

## 2019-07-13 NOTE — Telephone Encounter (Signed)
Called and given below message. She verbalized understanding. [name] is doing a virtual visit with pulmonary today for shortness of breath. [name] is having surgery on 10/15. She can bring her Mom next week on 10/12, 10/13 or 10/15.

## 2019-07-13 NOTE — Progress Notes (Signed)
Virtual Visit via Video Note  I connected with [name] on 07/13/19 at  2:00 PM EDT by a video enabled telemedicine application and verified that I am speaking with the correct person using two identifiers.  Location: Patient: Home  Provider: Home / Office    I discussed the limitations of evaluation and management by telemedicine and the availability of in person appointments. The patient expressed understanding and agreed to proceed.  History of Present Illness: [name] is a pleasant 85 year old former smoker with a history of COPD (FEV1 52% predicted), also history of ovarian cancer, chronic anemia, atrial fibrillation on Xarelto, recurrent right pleural effusion previously with Pleurx catheter (cytology negative), transudative.   She was treated 07/08/2023 a suspected acute exacerbation of her COPD with prednisone and doxycycline after developing increased sob, cough that had sputum difficult to clear.  She subsequently was admitted 07/09/2019, treated for suspected community-acquired pneumonia with azithromycin, cefdinir and continued on prednisone.  She is currently on 8m for 2 more days.  COPD regimen includes Stiolto, albuterol which she uses approximately 1-2x a day during this recent illness.  She is oxygen at 2-3 liters per minute.   Observations/Objective: She may be slightly improved since hospitalization but remains dyspneic, clearly worse than her usual baseline.  She continues to cough but is now having less difficulty bringing up her secretions.  A bit less purulent.   Chest x-ray performed on 07/09/2019 reviewed by me showed bilateral effusions, more notable on the right than on the left, possibly increased compared with her prior imaging, CT chest 8/21 and chest x-ray 8/11.  Technique was different so difficult to interpret.  Assessment and Plan: We discussed today that is likely that her residual dyspnea is related not only to the recent right lower lobe pneumonia  which will take some time for full recovery but also involving right pleural effusion.  I think that the effusion will need to be tapped both for sampling and also therapeutic benefits.  She asked about whether she would need replacement of a Pleurx tube and I am not convinced that that will be necessary.  It will depend on how quickly the fluid reaccumulate's.  She is on Xarelto and will need to hold this 48 hours prior to any procedure.  We will send a consult to interventional radiology for a right thoracentesis with cytology, culture, labs to be performed on that fluid.  She will continue her Stiolto, albuterol as needed.  Continue her oxygen as she has been using it.  She needs a follow-up visit with either myself or APP next week after the thoracentesis to review her status.  Follow Up Instructions: OV next week after thora   I discussed the assessment and treatment plan with the patient. The patient was provided an opportunity to ask questions and all were answered. The patient agreed with the plan and demonstrated an understanding of the instructions.   The patient was advised to call back or seek an in-person evaluation if the symptoms worsen or if the condition fails to improve as anticipated.  I provided 30 minutes of non-face-to-face time during this encounter.   RCollene Gobble MD

## 2019-07-13 NOTE — Telephone Encounter (Signed)
Returned patient's phone call regarding rescheduling an appointment, per request 10/5 appointment has moved to 10/13. MD approved per scheduled messages.

## 2019-07-14 ENCOUNTER — Ambulatory Visit (HOSPITAL_COMMUNITY): Payer: Medicare Other

## 2019-07-14 ENCOUNTER — Telehealth: Payer: Self-pay | Admitting: Emergency Medicine

## 2019-07-14 LAB — CULTURE, RESPIRATORY W GRAM STAIN

## 2019-07-14 NOTE — Telephone Encounter (Signed)
[name] has been R/S to 10/12 @ 10 AM, check in @ 9:45 AM.  Ander Purpura, will you please schedule COVID at [facility].  Currently, this is scheduled at the [facility] location for 10/8 @ 11:05.  Thank you.

## 2019-07-14 NOTE — Telephone Encounter (Signed)
Patient has been scheduled for covid screen at [facility] on 10/8. Patient is aware of procedure on 10/12 and was instructed to hold Xaralto 48 hours prior to procedure. That would mean Sat and Sun.  Patient voiced understanding, nothing further needed at this time.

## 2019-07-14 NOTE — Telephone Encounter (Signed)
Patient's daughter is having surgery at [facility] on 10/16 and doesn't feel that she will be able to get her mom to the thoracentesis on 10/15. She is wondering if it can be rescheduled for earlier that week like Monday, patient had chemo on Tuesday.   [facility] please advise if we can get this scheduled sooner for patient.

## 2019-07-16 ENCOUNTER — Ambulatory Visit (HOSPITAL_COMMUNITY): Payer: Medicare Other

## 2019-07-16 ENCOUNTER — Telehealth: Payer: Self-pay

## 2019-07-16 ENCOUNTER — Ambulatory Visit: Payer: Medicare Other | Admitting: Primary Care

## 2019-07-16 ENCOUNTER — Other Ambulatory Visit (HOSPITAL_COMMUNITY)
Admission: RE | Admit: 2019-07-16 | Discharge: 2019-07-16 | Disposition: A | Discharge status: Home / Self Care | Payer: Medicare Other | Source: Ambulatory Visit | Attending: Emergency Medicine | Admitting: Emergency Medicine

## 2019-07-16 ENCOUNTER — Telehealth: Payer: Self-pay | Admitting: Emergency Medicine

## 2019-07-16 ENCOUNTER — Other Ambulatory Visit: Payer: Self-pay

## 2019-07-16 DIAGNOSIS — Z20828 Contact with and (suspected) exposure to other viral communicable diseases: Secondary | ICD-10-CM | POA: Diagnosis not present

## 2019-07-16 DIAGNOSIS — Z01812 Encounter for preprocedural laboratory examination: Secondary | ICD-10-CM | POA: Diagnosis present

## 2019-07-16 LAB — SARS CORONAVIRUS 2 (TAT 6-24 HRS): SARS Coronavirus 2: NEGATIVE

## 2019-07-16 NOTE — Telephone Encounter (Signed)
Called pt and advised message from the provider. Pt understood and verbalized understanding. Nothing further is needed.   Made an appt with [name] tomorrow 07/17/2019 at 11:00

## 2019-07-16 NOTE — Telephone Encounter (Signed)
She has just completed abx and steroids. Unfortunately not clear whether there is anything to add remotely unless [name] believes that the principle problem is cough - in that case we might be able to use cough suppression w tessalon 1105m q6h prn + delsym. The altered Laura concerns me that something else might be going on besides accumulation of her pleural effusion. I know Laura PGabrielsenwants to avoid the hospital, but I believe she needs to go be seen. The alternative is to avoid the ED, try to be seen here by APP tomorrow, but they would have to accept that there is some risk for acute worsening if they wait.

## 2019-07-16 NOTE — Telephone Encounter (Signed)
Spoke with pt's daughter, [name]. States that pt is not improving since her video visit with RB on 07/13/2019. Reports that her shortness of breath and coughing are substantially worse. Pt can't stay awake and at times is "loopy" per her daughter. Denies fever, chills, chest tightness, wheezing. Pt is scheduled for a thoracentesis on 07/20/2019. I recommended to [name] that if the pt is not improving then she might benefit from going to the hospital. [name] states that the pt will refuse to go. She is requesting RB's recommendations.  RB - please advise. Thanks.

## 2019-07-16 NOTE — Telephone Encounter (Signed)
[name] called and left a message to call her.  Called back. [name] is sleeping all the time. She is the same as she was in the hospital. She is complaining of more shortness of breath. She is confused/ disoriented at times. [name] has called Dr. Agustina Caroli office and waiting on a call back. [name] is scheduled for a thoracentesis on 10/12. Just FYI. Instructed [name] to call the office if needed. She verbalized understanding.

## 2019-07-17 ENCOUNTER — Ambulatory Visit (HOSPITAL_COMMUNITY): Payer: Medicare Other

## 2019-07-20 ENCOUNTER — Ambulatory Visit (HOSPITAL_COMMUNITY)
Admission: RE | Admit: 2019-07-20 | Discharge: 2019-07-20 | Disposition: A | Discharge status: Home / Self Care | Payer: Medicare Other | Source: Ambulatory Visit | Attending: Emergency Medicine | Admitting: Emergency Medicine

## 2019-07-20 ENCOUNTER — Other Ambulatory Visit: Payer: Self-pay

## 2019-07-20 ENCOUNTER — Ambulatory Visit (HOSPITAL_COMMUNITY): Payer: Medicare Other

## 2019-07-20 ENCOUNTER — Other Ambulatory Visit: Payer: Self-pay | Admitting: Emergency Medicine

## 2019-07-20 ENCOUNTER — Encounter (HOSPITAL_COMMUNITY): Payer: Self-pay | Admitting: Student

## 2019-07-20 DIAGNOSIS — J90 Pleural effusion, not elsewhere classified: Secondary | ICD-10-CM

## 2019-07-20 HISTORY — PX: IR THORACENTESIS ASP PLEURAL SPACE W/IMG GUIDE: IMG5380

## 2019-07-20 LAB — BODY FLUID CELL COUNT WITH DIFFERENTIAL
Eos, Fluid: 0 %
Lymphs, Fluid: 52 %
Monocyte-Macrophage-Serous Fluid: 21 % — ABNORMAL LOW (ref 50–90)
Neutrophil Count, Fluid: 22 % (ref 0–25)
Total Nucleated Cell Count, Fluid: 615 cu mm (ref 0–1000)

## 2019-07-20 LAB — LACTATE DEHYDROGENASE, PLEURAL OR PERITONEAL FLUID: LD, Fluid: 67 U/L — ABNORMAL HIGH (ref 3–23)

## 2019-07-20 MED ORDER — LIDOCAINE HCL (PF) 1 % IJ SOLN
INTRAMUSCULAR | Status: DC | PRN
  Administered 2019-07-20: 10 mL

## 2019-07-20 MED ORDER — LIDOCAINE HCL 1 % IJ SOLN
INTRAMUSCULAR | Status: AC
  Filled 2019-07-20: qty 20

## 2019-07-20 NOTE — Procedures (Signed)
PROCEDURE SUMMARY:  Successful image-guided right thoracentesis. Yielded 600 milliliters of clear yellow fluid. Patient tolerated procedure well. No immediate complications. EBL < 5 mL.  Specimen was sent for labs. CXR ordered.  Please see imaging section of Epic for full dictation.   Claris Pong Halayna Blane PA-C 07/20/2019 10:53 AM

## 2019-07-21 ENCOUNTER — Telehealth: Payer: Self-pay | Admitting: Hematology and Oncology

## 2019-07-21 ENCOUNTER — Other Ambulatory Visit: Payer: Self-pay

## 2019-07-21 ENCOUNTER — Inpatient Hospital Stay: Payer: Medicare Other

## 2019-07-21 ENCOUNTER — Inpatient Hospital Stay (HOSPITAL_BASED_OUTPATIENT_CLINIC_OR_DEPARTMENT_OTHER): Payer: Medicare Other | Admitting: Hematology and Oncology

## 2019-07-21 ENCOUNTER — Inpatient Hospital Stay: Payer: Medicare Other | Attending: Hematology and Oncology

## 2019-07-21 ENCOUNTER — Encounter: Payer: Self-pay | Admitting: Hematology and Oncology

## 2019-07-21 VITALS — BP 127/55 | HR 96 | Temp 98.9°F | Resp 17 | Ht 64.0 in | Wt 164.8 lb

## 2019-07-21 DIAGNOSIS — Z90722 Acquired absence of ovaries, bilateral: Secondary | ICD-10-CM | POA: Diagnosis not present

## 2019-07-21 DIAGNOSIS — C562 Malignant neoplasm of left ovary: Secondary | ICD-10-CM

## 2019-07-21 DIAGNOSIS — K5909 Other constipation: Secondary | ICD-10-CM | POA: Diagnosis not present

## 2019-07-21 DIAGNOSIS — R531 Weakness: Secondary | ICD-10-CM | POA: Diagnosis not present

## 2019-07-21 DIAGNOSIS — D638 Anemia in other chronic diseases classified elsewhere: Secondary | ICD-10-CM | POA: Diagnosis not present

## 2019-07-21 DIAGNOSIS — R590 Localized enlarged lymph nodes: Secondary | ICD-10-CM | POA: Diagnosis not present

## 2019-07-21 DIAGNOSIS — Z5111 Encounter for antineoplastic chemotherapy: Secondary | ICD-10-CM | POA: Diagnosis present

## 2019-07-21 DIAGNOSIS — Z7189 Other specified counseling: Secondary | ICD-10-CM

## 2019-07-21 DIAGNOSIS — Z23 Encounter for immunization: Secondary | ICD-10-CM

## 2019-07-21 DIAGNOSIS — G893 Neoplasm related pain (acute) (chronic): Secondary | ICD-10-CM | POA: Insufficient documentation

## 2019-07-21 DIAGNOSIS — J90 Pleural effusion, not elsewhere classified: Secondary | ICD-10-CM | POA: Insufficient documentation

## 2019-07-21 DIAGNOSIS — I480 Paroxysmal atrial fibrillation: Secondary | ICD-10-CM | POA: Diagnosis not present

## 2019-07-21 DIAGNOSIS — Z90710 Acquired absence of both cervix and uterus: Secondary | ICD-10-CM | POA: Diagnosis not present

## 2019-07-21 DIAGNOSIS — Z9221 Personal history of antineoplastic chemotherapy: Secondary | ICD-10-CM | POA: Diagnosis not present

## 2019-07-21 DIAGNOSIS — Z87891 Personal history of nicotine dependence: Secondary | ICD-10-CM | POA: Diagnosis not present

## 2019-07-21 DIAGNOSIS — I5032 Chronic diastolic (congestive) heart failure: Secondary | ICD-10-CM | POA: Diagnosis not present

## 2019-07-21 DIAGNOSIS — I700 Atherosclerosis of aorta: Secondary | ICD-10-CM | POA: Insufficient documentation

## 2019-07-21 DIAGNOSIS — Z79899 Other long term (current) drug therapy: Secondary | ICD-10-CM | POA: Diagnosis not present

## 2019-07-21 LAB — CBC WITH DIFFERENTIAL/PLATELET
Abs Immature Granulocytes: 0.43 10*3/uL — ABNORMAL HIGH (ref 0.00–0.07)
Basophils Absolute: 0 10*3/uL (ref 0.0–0.1)
Basophils Relative: 0 %
Eosinophils Absolute: 0.2 10*3/uL (ref 0.0–0.5)
Eosinophils Relative: 2 %
HCT: 28.4 % — ABNORMAL LOW (ref 36.0–46.0)
Hemoglobin: 8.4 g/dL — ABNORMAL LOW (ref 12.0–15.0)
Immature Granulocytes: 4 %
Lymphocytes Relative: 13 %
Lymphs Abs: 1.5 10*3/uL (ref 0.7–4.0)
MCH: 23.8 pg — ABNORMAL LOW (ref 26.0–34.0)
MCHC: 29.6 g/dL — ABNORMAL LOW (ref 30.0–36.0)
MCV: 80.5 fL (ref 80.0–100.0)
Monocytes Absolute: 1 10*3/uL (ref 0.1–1.0)
Monocytes Relative: 8 %
Neutro Abs: 8.5 10*3/uL — ABNORMAL HIGH (ref 1.7–7.7)
Neutrophils Relative %: 73 %
Platelets: 271 10*3/uL (ref 150–400)
RBC: 3.53 MIL/uL — ABNORMAL LOW (ref 3.87–5.11)
RDW: 19.5 % — ABNORMAL HIGH (ref 11.5–15.5)
WBC: 11.6 10*3/uL — ABNORMAL HIGH (ref 4.0–10.5)
nRBC: 0 % (ref 0.0–0.2)

## 2019-07-21 LAB — COMPREHENSIVE METABOLIC PANEL
ALT: 10 U/L (ref 0–44)
AST: 13 U/L — ABNORMAL LOW (ref 15–41)
Albumin: 2.8 g/dL — ABNORMAL LOW (ref 3.5–5.0)
Alkaline Phosphatase: 54 U/L (ref 38–126)
Anion gap: 10 (ref 5–15)
BUN: 13 mg/dL (ref 8–23)
CO2: 33 mmol/L — ABNORMAL HIGH (ref 22–32)
Calcium: 8.3 mg/dL — ABNORMAL LOW (ref 8.9–10.3)
Chloride: 97 mmol/L — ABNORMAL LOW (ref 98–111)
Creatinine, Ser: 1.13 mg/dL — ABNORMAL HIGH (ref 0.44–1.00)
GFR calc Af Amer: 52 mL/min — ABNORMAL LOW (ref >60)
GFR calc non Af Amer: 45 mL/min — ABNORMAL LOW (ref >60)
Glucose, Bld: 140 mg/dL — ABNORMAL HIGH (ref 70–99)
Potassium: 3.6 mmol/L (ref 3.5–5.1)
Sodium: 140 mmol/L (ref 135–145)
Total Bilirubin: 0.3 mg/dL (ref 0.3–1.2)
Total Protein: 6.2 g/dL — ABNORMAL LOW (ref 6.5–8.1)

## 2019-07-21 LAB — CYTOLOGY - NON PAP

## 2019-07-21 LAB — SAMPLE TO BLOOD BANK

## 2019-07-21 MED ORDER — INFLUENZA VAC A&B SA ADJ QUAD 0.5 ML IM PRSY
PREFILLED_SYRINGE | INTRAMUSCULAR | Status: AC
  Filled 2019-07-21: qty 0.5

## 2019-07-21 MED ORDER — INFLUENZA VAC A&B SA ADJ QUAD 0.5 ML IM PRSY
0.5000 mL | PREFILLED_SYRINGE | Freq: Once | INTRAMUSCULAR | Status: AC
  Administered 2019-07-21: 0.5 mL via INTRAMUSCULAR

## 2019-07-21 MED ORDER — SODIUM CHLORIDE 0.9% FLUSH
10.0000 mL | Freq: Once | INTRAVENOUS | Status: AC
  Administered 2019-07-21: 08:00:00 10 mL
  Filled 2019-07-21: qty 10

## 2019-07-21 MED ORDER — OXYCODONE HCL 10 MG PO TABS: 10.0000 mg | ORAL_TABLET | ORAL | 0 refills | Status: DC | PRN

## 2019-07-21 NOTE — Progress Notes (Signed)
[place] OFFICE PROGRESS NOTE  Patient Care Team: Asencion Noble, MD as PCP - General (Internal Medicine) Satira Sark, MD as PCP - Cardiology (Cardiology) Ahmed Prima, Fransisco Hertz, PA-C as Physician Assistant (Physician Assistant) Heath Lark, MD as Consulting Physician (Hematology and Oncology) Collene Gobble, MD as Consulting Physician (Pulmonary Disease)  ASSESSMENT & PLAN:  Left ovarian epithelial cancer [facility]) She is frail She has numerous hospitalization recently for recurrent COPD exacerbation/pneumonia She has poorly controlled pain due to recent shingles The patient is too weak with estimated ECOG performance status score of 3 I had a long discussion with the patient in the presence of her daughter I do not recommend we proceed with treatment until her performance status improves and she has no further hospitalization in 30 days from her recent discharge from the hospital We will provide supportive care only I will cancel her treatment today and reschedule to the first week of November  Cancer associated pain She has multifactorial pain, a combination of chronic joint pain with recent exacerbation of neuropathic pain from shingles and chemotherapy She has poor tolerance to pain medications She rated her pain at 7 or 8 out of 10 today Recently, we introduce long-acting morphine sulfate According to her daughter, while her pain appears under control, the patient was heavily sedated with some intermittent confusion However, with last pain medication, she has been calling with severe uncontrolled pain I felt that the chemotherapy, no doubt, had exacerbated her recent neuropathic pain We have very long and extensive discussion about chronic pain management Ultimately, the patient has decided not to proceed with morphine sulfate and once her pain medication to be reduced back to oxycodone 10 mg dose I refill her prescription today I warned her about risk of  constipation  Anemia, chronic disease This is multifactorial, likely anemia chronic disease as well as recurrent infection and recent chemotherapy She does not need transfusion support today  Other constipation She has worsening constipation since recent changes in her pain medication We discussed aggressive laxative therapy  Generalized weakness She has worsening generalized weakness since recent discharge from the hospital Her performance status has deteriorated She has poorly controlled pain amongst other medical issues For now, we will continue supportive care and adjust her medication Hopefully, with withholding chemotherapy today, she has time to recover next month  Goals of care, counseling/discussion We had numerous goals of care discussions in the past The patient was adamant in the past to treat regardless of her performance status and health issues I try my best to explain to the patient, in the presence of her daughter the rationale of withholding treatment today given her poor performance status and the chemotherapy likely exacerbated a lot of her health issues Ultimately, the patient agreed to hold treatment today   No orders of the defined types were placed in this encounter.   INTERVAL HISTORY: Please see below for problem oriented charting. She returns today for chemotherapy and follow-up Her daughter, [name] is also present She was recently discharged from the hospital for weakness and pneumonia She appears somewhat frail today She complained of severe, uncontrolled pain from the shingles and overall generalized neuropathic discomfort When her primary care doctor attempted to increase gabapentin, the patient developed confusion When I introduced long-acting morphine sulfate, the patient slept more than 12 hours from sedated effects She is constipated Her appetite is poor  SUMMARY OF ONCOLOGIC HISTORY: Oncology History Overview Note  High grade serous ER 90%,  PR 0%  BRCA 1: no loss of expression MMR normal  R1 resection, platinum refractory, progressed on Femara, carboplatin and Gemzar   Left ovarian epithelial cancer ([place])  02/18/2016 Tumor Marker   Patient's tumor was tested for the following markers: CA125 Results of the tumor marker test revealed 45   05/22/2016 Tumor Marker   Patient's tumor was tested for the following markers: CA125 Results of the tumor marker test revealed 53   05/22/2016 Imaging   Outside pelvic US showed 4.1 cm adnexa mass   06/24/2017 Imaging   Ct abdomen and pelvis:  1. Interim finding of moderate ascites within the abdomen and pelvis with additional finding of diffuse nodular infiltration of the omentum and anterior mesenteric fat, the appearance would be consistent with peritoneal carcinomatosis/metastatic disease. Increasing retroperitoneal and upper abdominal adenopathy. 2. Re- demonstrated 3.8 cm cyst in the right adnexa. Enlarging soft tissue density in the left adnexa now with possible cystic component posteriorly. In light of the above findings, concern is for ovarian neoplasm. Correlation with pelvic ultrasound recommended. 3. Small right-sided pleural effusion, new since prior study 4. Stable hypodense splenic lesions since 2017.    06/25/2017 Imaging   US pelvis: 2.9 cm simple appearing cyst in the right ovary. Left ovary grossly unremarkable. Large volume ascites in the pelvis   06/30/2017 - 07/01/2017 Hospital Admission   She was admitted for evaluation of abdominal pain and ascites   07/01/2017 Pathology Results   PERITONEAL/ASCITIC FLUID(SPECIMEN 1 OF 1 COLLECTED 07/01/17): - POORLY DIFFERENTIATED CARCINOMA; SEE COMMENT Source Peritoneal/Ascitic Fluid, (specimen 1 of 1 collected 07/01/17) Gross Specimen: Received is/are 1000 cc's of brownish fluid. (BS:bs) Prepared: # Smears: 0 # Concentration Technique Slides (i.e. ThinPrep): 1 # Cell Block: 1 Additional Studies: Also received Hematology slide  - [id number]. Comment The tumor cells are positive for cytokeratin 7 and Pax-8 but negative for cytokeratin 20, CDX-2, GATA-3, Napsin-A and TTF-1. Based on the immunoprofile a gynecology primary is favored   07/01/2017 Procedure   Successful ultrasound-guided diagnostic and therapeutic paracentesis yielding 2.5 liters of peritoneal fluid   07/07/2017 - 07/09/2017 Hospital Admission   She was admitted for management of malignant ascites   07/08/2017 Procedure   Successful ultrasound-guided therapeutic paracentesis yielding 2.7 liters liters of peritoneal fluid   07/12/2017 Procedure   Successful ultrasound-guided paracentesis yielding 1450 mL of peritoneal fluid   07/18/2017 - 07/24/2017 Hospital Admission   She was admitted for expedited treatment   07/18/2017 Tumor Marker   Patient's tumor was tested for the following markers: CA125 Results of the tumor marker test revealed 1941   07/19/2017 - 02/04/2018 Chemotherapy   The patient had 6 cycles of carboplatin & Taxol for chemotherapy treatment, followed by 3 more cycles of carboplatin only    07/19/2017 - 02/04/2018 Chemotherapy   The patient had carboplatin and taxol   08/06/2017 Procedure   Successful ultrasound-guided therapeutic paracentesis yielding 2.6 liters of peritoneal fluid.   08/09/2017 Tumor Marker   Patient's tumor was tested for the following markers: CA125 Results of the tumor marker test revealed 1665   08/15/2017 Tumor Marker   Patient's tumor was tested for the following markers: CA125 Results of the tumor marker test revealed 937.9   08/20/2017 Imaging   ECHO: Normal LV size with EF 60-65%. Normal RV size and systolic function. No significant valvular abnormalities.   09/18/2017 Imaging   Chest Impression:  1. No evidence thoracic metastasis. 2. Interval increase and RIGHT pleural effusion.  Abdomen / Pelvis Impression:  1. Interval decrease  in intraperitoneal free fluid. 2. Interval decrease in omental  nodularity in the LEFT ventral peritoneal space. 3. Interval decrease in nodularity associated with the LEFT ovary. 4. Cystic portion of the RIGHT ovary is increased mildly in size.   09/20/2017 Tumor Marker   Patient's tumor was tested for the following markers: CA125 Results of the tumor marker test revealed 347   10/14/2017 Tumor Marker   Patient's tumor was tested for the following markers: CA125 Results of the tumor marker test revealed 307.4   11/04/2017 Tumor Marker   Patient's tumor was tested for the following markers: CA125 Results of the tumor marker test revealed 262.5   11/28/2017 Imaging   1. Interval decrease in right pleural effusion with resolution of right atelectasis seen previously. 2. New small left pleural effusion, symmetric to the right. 3. No intraperitoneal free fluid on the current study. 4. Continued further decrease in left omental disease, appearing less confluent today than on the prior study. 5. Left ovary remains normal in appearance today and the right adnexal cystic lesion is decreased in size compared to prior study. 6. 14 mm pancreatic cyst is unchanged. Continued attention on follow-up imaging recommended. 7. Aortic Atherosclerois (ICD10-170.0)   12/13/2017 Tumor Marker   Patient's tumor was tested for the following markers: CA125 Results of the tumor marker test revealed 197.7   01/03/2018 Tumor Marker   Patient's tumor was tested for the following markers: CA125 Results of the tumor marker test revealed 183.1   01/14/2018 Tumor Marker   Patient's tumor was tested for the following markers: CA125 Results of the tumor marker test revealed 177.4   02/04/2018 Tumor Marker   Patient's tumor was tested for the following markers: CA125 Results of the tumor marker test revealed 168.5   02/25/2018 Imaging   1. Omental carcinomatosis appears qualitatively stable to slightly decreased. Stable mild peritoneal thickening in the paracolic gutters. Stable  right adnexal cyst. No ascites. No new or progressive metastatic disease in the abdomen or pelvis. 2. Small dependent right pleural effusion is increased. 3. Cystic pancreatic body lesion is decreased and now subcentimeter in size, suggesting a benign lesion. 4. Aortic Atherosclerosis (ICD10-I70.0).   03/03/2018 - 03/07/2018 Hospital Admission   She was hospitalized for GI bleed requiring blood transfusions. Xarelto was placed on hold   03/07/2018 PET scan   1. Persistent hazy omental interstitial nodularity but no hypermetabolism or discrete measurable nodules. No abdominal ascites. 2. No findings for metastatic disease involving the chest. 3. Moderate-sized right pleural effusion and small left pleural effusion.    03/20/2018 Pathology Results   1. Ovary and fallopian tube, right - OVARY AND FALLOPIAN TUBE INVOLVED BY SEROUS CARCINOMA. - PARATUBAL CYST. 2. Uterus +/- tubes/ovaries, neoplastic, cervix, left ovary and fallopian tube - LEFT OVARY: HIGH GRADE SEROUS CARCINOMA WITH TREATMENT EFFECT, SPANNING 2.5 CM. CARCINOMA INVOLVES OVARIAN SURFACE. SEE ONCOLOGY TABLE. - LEFT FALLOPIAN TUBE: INVOLVED BY SEROUS CARCINOMA. - UTERUS: -ENDOMETRIUM: INACTIVE ENDOMETRIUM. NO HYPERPLASIA OR MALIGNANCY. -MYOMETRIUM: UNREMARKABLE. NO MALIGNANCY. -SEROSA: INVOLVED BY SEROUS CARCINOMA. - CERVIX: ENDOCERVICAL POLYP. NO MALIGNANCY. 3. Omentum, resection for tumor - INVOLVED BY SEROUS CARCINOMA. 4. Soft tissue, biopsy, mesenteric nodule - INVOLVED BY SEROUS CARCINOMA. Microscopic Comment 2. OVARY or FALLOPIAN TUBE or PRIMARY PERITONEUM: Procedure: Total hysterectomy and bilateral salpingo-oophorectomy. Omentectomy. Mesenteric lymph node biopsy. Specimen Integrity: Intact. Tumor Site: Left ovary. Ovarian Surface Involvement (required only if applicable): Present. Fallopian Tube Surface Involvement (required only if applicable): Present, bilateral. Tumor Size: 2.5 cm. Histologic Type: High grade  serous carcinoma. Histologic Grade: High grade. Implants (required for advanced stage serous/seromucinous borderline tumors only): N/A. Other Tissue/ Organ Involvement: Bilateral fallopian tubes, right ovary, uterine serosa, omentum. Largest Extrapelvic Peritoneal Focus (required only if applicable): Microscopic, estimated 0.5 cm (omentum). Peritoneal/Ascitic Fluid: Prior Positive ([id number]). Treatment Effect (required only for high-grade serous carcinomas): Present in left ovary. CRS2. Regional Lymph Nodes: No lymph nodes submitted/identified. Pathologic Stage Classification (pTNM, AJCC 8th Edition): ypT3b, ypNX Representative Tumor Block: 1A, 1B, 245F, 45F. Comment(s): The right ovary has only surface deposits with a large paratubal cyst. The left ovary has intraparenchymal tumor with associated treatment effect. Thus the tumor location is classified as a left ovarian primary.   03/20/2018 Surgery   Procedure(s) Performed:  1. Exploratory laparotomy with total hysterectomy and bilateral salpingo-oophorectomy 2. Infragastic Omentectomy  3. Debulking to <1cm gross residual disease   Surgeon: Mart Piggs, MD  Specimens: Uterus Cervix, Bilateral tubes / ovaries and omentum. Mesenteric nodule.  Operative Findings: Debulked to gross residual disease <1cm; however there is miliary disease in multiple locations including the majority of the abdominal peritoneum (anterior abdominal wall, bilateral gutters), diaphragm (Right>left), majority of small bowel mesentary. Normal appendix. Normal small uterus. Right ovary with a cystic lesion ~3cm, some adhesive disease of right adnexa to rectum/sigmoid. Gross omental disease, which was resected with the omentectomy. Smooth liver surface, but again, diaphragmatic disease noted.      03/20/2018 Genetic Testing   Patient has genetic testing done for ER/PR. Results revealed patient has ER: 90%, PR 0%.    03/31/2018 Tumor Marker    Patient's tumor was tested for the following markers: CA125 Results of the tumor marker test revealed 215.8    Genetic Testing   Patient has genetic testing done for BRCA 1. Results revealed patient has the following: BRCA 1: no loss of expression.    Genetic Testing   Patient has genetic testing done for MMR . Results revealed patient has the following:  MMR: normal   03/31/2018 Genetic Testing   Patient has genetic testing done for BRCA1/2. Results revealed patient has no actionable mutations. She is found to have [place] genetic change of unknown significance   04/21/2018 Imaging   1. No definite findings of residual or recurrent metastatic disease in the abdomen or pelvis status post interval TAHBSO and omentectomy. Stable minimal thickening in the paracolic gutters without discrete peritoneal nodularity. 2. Trace free fluid in the pelvic cul-de-sac. 3. Stable small dependent right pleural effusion. 4. Subcentimeter pancreatic body cystic lesion is stable to slightly decreased. 5. Aortic Atherosclerosis (ICD10-I70.0).   04/21/2018 Tumor Marker   Patient's tumor was tested for the following markers: CA125 Results of the tumor marker test revealed 187.3   04/23/2018 - 09/12/2018 Anti-estrogen oral therapy   She is placed on Femara   05/08/2018 Procedure   Successful ultrasound guided right thoracentesis yielding 800 mL of pleural fluid. Fluid cytology is negative for malignancy    05/28/2018 Tumor Marker   Patient's tumor was tested for the following markers: CA125 Results of the tumor marker test revealed 190   06/30/2018 Tumor Marker   Patient's tumor was tested for the following markers: CA125 Results of the tumor marker test revealed 148   07/23/2018 Imaging   Status post hysterectomy and bilateral salpingo-oophorectomy.  Very mild peritoneal thickening/nodularity, equivocal but worrisome for very mild peritoneal disease. Attention on follow-up is suggested.  Small right  pleural effusion with indwelling pleural drain.   07/23/2018 Tumor Marker   Patient's tumor  was tested for the following markers: CA125 Results of the tumor marker test revealed 215   09/11/2018 Tumor Marker   Patient's tumor was tested for the following markers: CA-125. Results of the tumor marker test revealed 1323   09/12/2018 Imaging   1. Mildly progressive ascites with some mild degree of nodularity most appreciable along the left adnexa, right paracolic gutter, right upper quadrant, compatible with peritoneal spread of tumor. 2. Small right pleural effusion with indwelling right pleural catheter again noted. Mild increase in atelectasis anteriorly at the right lung base. 3. Aortic Atherosclerosis (ICD10-I70.0). Coronary atherosclerosis. 4. Several tiny hypodense lesions in the spleen are technically nonspecific, but stable. 5. Air fluid level in the rectum compatible with diarrheal process. 6. Prominent bilateral hip arthropathy. Left hip screw noted.   09/22/2018 - 02/02/2019 Chemotherapy   The patient had carboplatin and gemzar   09/29/2018 Tumor Marker   Patient's tumor was tested for the following markers: CA125 Results of the tumor marker test revealed 1219   09/29/2018 Adverse Reaction   Cycle 1 day 8 of Gemzar was omitted due to severe anemia   10/20/2018 Tumor Marker   Patient's tumor was tested for the following markers: CA125 Results of the tumor marker test revealed 402   11/13/2018 Tumor Marker   Patient's tumor was tested for the following markers: CA125 Results of the tumor marker test revealed 437   11/26/2018 Imaging   1. Mild residual peritoneal thickening, overall decreased from 09/11/2018, with resolution of previously seen ascites. 2. Tiny residual right pleural effusion with small bore chest tube in place. 3. Supraumbilical midline ventral hernia has a wide neck and contains unobstructed colon. 4.  Aortic atherosclerosis (ICD10-170.0).    01/05/2019  Tumor Marker   Patient's tumor was tested for the following markers: CA125 Results of the tumor marker test revealed 929   02/02/2019 Tumor Marker   Patient's tumor was tested for the following markers: CA125 Results of the tumor marker test revealed 1198   02/09/2019 Imaging   1. New mild ascites. Mild peritoneal thickening shows no significant change, but remains consistent with peritoneal carcinomatosis. 2. Increased tiny bilateral pleural effusions. 3. Partial small bowel obstruction with transition point in the anterior lower abdomen, just deep to the anterior abdominal wall. 4. Stable small ventral abdominal wall hernia containing transverse colon.   02/17/2019 -  Chemotherapy   The patient had PACLitaxel for chemotherapy treatment.    03/05/2019 Tumor Marker   Patient's tumor was tested for the following markers: CA125 Results of the tumor marker test revealed 1243   03/11/2019 Imaging   CXR Small bilateral subpulmonic effusions with basilar atelectasis. These are probably slightly larger than were seen in March.   03/30/2019 Tumor Marker   Patient's tumor was tested for the following markers: CA-125 Results of the tumor marker test revealed 776   05/01/2019 Tumor Marker   Patient's tumor was tested for the following markers: CA-125 Results of the tumor marker test revealed 180   05/19/2019 Tumor Marker   Patient's tumor was tested for the following markers: CA-125 Results of the tumor marker test revealed 163   05/29/2019 Imaging   1. Interval resolution of small volume ascites noted within the pelvis. Continued mild peritoneal thickening consistent with peritoneal carcinomatosis. 2. No new or progressive findings identified within the chest, abdomen or pelvis. 3. Partial small bowel obstruction described on 02/09/2019 is similar to previous exam. 4. Unchanged appearance of small pleural effusions, right greater than left. 5. Cystic  lesion within neck of pancreas is identified,  indeterminate measuring 1.7 cm. Increased from 1.3 cm previously. According to consensus criteria follow-up imaging in 6 months with repeat contrast enhanced pancreas protocol MRI or CT is advised. This recommendation follows ACR consensus guidelines: Management of Incidental Pancreatic Cysts: A White Paper of the ACR Incidental Findings Committee. [place] [id number]. 6. Tiny small nonspecific nodules within the right upper lobe are unchanged from previous exam.     06/12/2019 Tumor Marker   Patient's tumor was tested for the following markers: CA-125 Results of the tumor marker test revealed 148   07/09/2019 - 07/12/2019 Hospital Admission   She had recurrent admissions to the hospital for pneumonia     REVIEW OF SYSTEMS:   Constitutional: Denies fevers, chills or abnormal weight loss Eyes: Denies blurriness of vision Ears, nose, mouth, throat, and face: Denies mucositis or sore throat Cardiovascular: Denies palpitation, chest discomfort or lower extremity swelling Skin: Denies abnormal skin rashes Lymphatics: Denies new lymphadenopathy or easy bruising Behavioral/Psych: Mood is stable, no new changes  All other systems were reviewed with the patient and are negative.  I have reviewed the past medical history, past surgical history, social history and family history with the patient and they are unchanged from previous note.  ALLERGIES:  is allergic to codeine.  MEDICATIONS:  Current Outpatient Medications  Medication Sig Dispense Refill  . albuterol (PROAIR HFA) 108 (90 Base) MCG/ACT inhaler Inhale 2 puffs into the lungs every 6 (six) hours as needed for wheezing or shortness of breath. 18 g 5  . ALPRAZolam (XANAX) 0.25 MG tablet Take 1 tablet (0.25 mg total) by mouth at bedtime as needed for anxiety. 5 tablet 0  . azelastine (ASTELIN) 0.1 % nasal spray Place 2 sprays into both nostrils 2 (two) times daily as needed for allergies.     . Cholecalciferol (VITAMIN D) 2000  units CAPS Take 2,000 Units by mouth daily.     Marland Kitchen dicyclomine (BENTYL) 10 MG capsule TAKE 1 CAPSULE(10 MG) BY MOUTH TWICE DAILY (Patient taking differently: Take 10 mg by mouth 2 (two) times daily. ) 60 capsule 1  . diltiazem (CARDIZEM CD) 180 MG 24 hr capsule Take 1 capsule (180 mg total) by mouth 2 (two) times a day. 180 capsule 3  . diphenhydrAMINE (BENADRYL) 25 mg capsule Take 25 mg by mouth every 8 (eight) hours as needed for itching.     . DULoxetine (CYMBALTA) 60 MG capsule Take 1 capsule (60 mg total) by mouth at bedtime. 30 capsule 3  . fluticasone (FLONASE) 50 MCG/ACT nasal spray Place 2 sprays into both nostrils 2 (two) times daily as needed (FOR NASAL CONGESTION.).     Marland Kitchen furosemide (LASIX) 40 MG tablet Take 1 tablet (40 mg total) by mouth daily. 90 tablet 3  . gabapentin (NEURONTIN) 300 MG capsule Take 1 capsule (300 mg total) by mouth at bedtime. 30 capsule 0  . guaiFENesin (MUCINEX) 600 MG 12 hr tablet Take 1 tablet (600 mg total) by mouth 2 (two) times daily. 30 tablet 1  . halobetasol (ULTRAVATE) 0.05 % cream Apply 1 application topically 2 (two) times daily as needed (psoriasis).     . hydrocortisone valerate cream (WESTCORT) 0.2 % Apply 1 application topically 2 (two) times daily. 45 g 0  . levothyroxine (SYNTHROID, LEVOTHROID) 137 MCG tablet Take 137 mcg by mouth daily before breakfast. For thyroid therapy    . Magnesium 400 MG CAPS Take 400 mg by mouth daily.     Marland Kitchen  metoprolol tartrate (LOPRESSOR) 25 MG tablet Take 1 tablet (25 mg total) by mouth 2 (two) times daily as needed. (Patient taking differently: Take 25 mg by mouth daily as needed (blood pressure). ) 180 tablet 3  . mirtazapine (REMERON) 15 MG tablet Take 1 tablet (15 mg total) by mouth at bedtime. 30 tablet 9  . Oxycodone HCl 10 MG TABS Take 1 tablet (10 mg total) by mouth every 4 (four) hours as needed. 90 tablet 0  . polyvinyl alcohol (LIQUIFILM TEARS) 1.4 % ophthalmic solution Place 1 drop into both eyes 2 (two) times  daily as needed for dry eyes.     . potassium chloride SA (K-DUR,KLOR-CON) 20 MEQ tablet TAKE 1 TABLET BY MOUTH DAILY AND TAKE 1 ADDITIONAL TABLET WHEN TAKING EXTRA LASIX DOSE (Patient taking differently: Take 20 mEq by mouth daily. ) 60 tablet 6  . Rivaroxaban (XARELTO) 15 MG TABS tablet Take 15 mg by mouth daily.    . Tiotropium Bromide-Olodaterol (STIOLTO RESPIMAT) 2.5-2.5 MCG/ACT AERS Inhale 2 puffs into the lungs daily. 4 g 11   No current facility-administered medications for this visit.    Facility-Administered Medications Ordered in Other Visits  Medication Dose Route Frequency Provider Last Rate Last Dose  . 0.9 %  sodium chloride infusion (Manually program via Guardrails IV Fluids)  250 mL Intravenous Once Heath Lark, MD   Stopped at 12/16/18 1500    PHYSICAL EXAMINATION: ECOG PERFORMANCE STATUS: 3 - Symptomatic, >50% confined to bed  Vitals:   07/21/19 0843  BP: (!) 127/55  Pulse: 96  Resp: 17  Temp: 98.9 F (37.2 C)  SpO2: 100%   Filed Weights   07/21/19 0843  Weight: 164 lb 12.8 oz (74.8 kg)    GENERAL:alert, no distress and comfortable.  She looks frail and weak, sitting on the wheelchair with oxygen delivered via nasal cannula SKIN: Her skin is pale EYES: normal, Conjunctiva are pink and non-injected, sclera clear OROPHARYNX:no exudate, no erythema and lips, buccal mucosa, and tongue normal  NECK: supple, thyroid normal size, non-tender, without nodularity LYMPH:  no palpable lymphadenopathy in the cervical, axillary or inguinal LUNGS: Bilateral wheezes are audible with increased breathing effort  HEART: regular rate & rhythm and no murmurs and no lower extremity edema ABDOMEN:abdomen soft, non-tender and normal bowel sounds Musculoskeletal:no cyanosis of digits and no clubbing  NEURO: alert & oriented x 3 with fluent speech, no focal motor/sensory deficits  LABORATORY DATA:  I have reviewed the data as listed    Component Value Date/Time   NA 140  07/21/2019 0822   NA 135 (L) 09/24/2017 0945   K 3.6 07/21/2019 0822   K 3.7 09/24/2017 0945   CL 97 (L) 07/21/2019 0822   CO2 33 (H) 07/21/2019 0822   CO2 30 (H) 09/24/2017 0945   GLUCOSE 140 (H) 07/21/2019 0822   GLUCOSE 101 09/24/2017 0945   BUN 13 07/21/2019 0822   BUN 15.3 09/24/2017 0945   CREATININE 1.13 (H) 07/21/2019 0822   CREATININE 1.11 (H) 06/24/2019 0848   CREATININE 0.8 09/24/2017 0945   CALCIUM 8.3 (L) 07/21/2019 0822   CALCIUM 9.1 09/24/2017 0945   PROT 6.2 (L) 07/21/2019 0822   PROT 6.6 09/24/2017 0945   ALBUMIN 2.8 (L) 07/21/2019 0822   ALBUMIN 3.3 (L) 09/24/2017 0945   AST 13 (L) 07/21/2019 0822   AST 18 06/24/2019 0848   AST 16 09/24/2017 0945   ALT 10 07/21/2019 0822   ALT 9 06/24/2019 0848   ALT 12 09/24/2017  0945   ALKPHOS 54 07/21/2019 0822   ALKPHOS 52 09/24/2017 0945   BILITOT 0.3 07/21/2019 0822   BILITOT 0.4 06/24/2019 0848   BILITOT 1.13 09/24/2017 0945   GFRNONAA 45 (L) 07/21/2019 0822   GFRNONAA 46 (L) 06/24/2019 0848   GFRAA 52 (L) 07/21/2019 0822   GFRAA 53 (L) 06/24/2019 0848    No results found for: SPEP, UPEP  Lab Results  Component Value Date   WBC 11.6 (H) 07/21/2019   NEUTROABS 8.5 (H) 07/21/2019   HGB 8.4 (L) 07/21/2019   HCT 28.4 (L) 07/21/2019   MCV 80.5 07/21/2019   PLT 271 07/21/2019      Chemistry      Component Value Date/Time   NA 140 07/21/2019 0822   NA 135 (L) 09/24/2017 0945   K 3.6 07/21/2019 0822   K 3.7 09/24/2017 0945   CL 97 (L) 07/21/2019 0822   CO2 33 (H) 07/21/2019 0822   CO2 30 (H) 09/24/2017 0945   BUN 13 07/21/2019 0822   BUN 15.3 09/24/2017 0945   CREATININE 1.13 (H) 07/21/2019 0822   CREATININE 1.11 (H) 06/24/2019 0848   CREATININE 0.8 09/24/2017 0945      Component Value Date/Time   CALCIUM 8.3 (L) 07/21/2019 0822   CALCIUM 9.1 09/24/2017 0945   ALKPHOS 54 07/21/2019 0822   ALKPHOS 52 09/24/2017 0945   AST 13 (L) 07/21/2019 0822   AST 18 06/24/2019 0848   AST 16 09/24/2017 0945    ALT 10 07/21/2019 0822   ALT 9 06/24/2019 0848   ALT 12 09/24/2017 0945   BILITOT 0.3 07/21/2019 0822   BILITOT 0.4 06/24/2019 0848   BILITOT 1.13 09/24/2017 0945       RADIOGRAPHIC STUDIES: I have personally reviewed the radiological images as listed and agreed with the findings in the report. Dg Chest 1 View  Result Date: 07/20/2019 CLINICAL DATA:  Pleural effusion; post thoracentesis. Hx of paroxysmal afib, diastolic CHF, COPD. Former [phone]).pleural effusion EXAM: CHEST  1 VIEW COMPARISON:  Chest radiograph 07/09/2019 FINDINGS: Port in the anterior chest wall with tip in distal SVC. Interval decrease in RIGHT pleural effusion following thoracentesis. No pneumothorax. Small LEFT effusion remains. IMPRESSION: 1. No pneumothorax following RIGHT thoracentesis. 2. Reduction in RIGHT pleural effusion. Electronically Signed   By: Suzy Bouchard M.D.   On: 07/20/2019 11:07   Dg Chest Port 1 View  Result Date: 07/09/2019 CLINICAL DATA:  Shortness of breath, cough EXAM: PORTABLE CHEST 1 VIEW COMPARISON:  05/19/2019 FINDINGS: Right Port-A-Cath is in place, unchanged. Small bilateral pleural effusions, right greater than left. Bibasilar atelectasis or infiltrates, also greater on the right. Findings have worsened on the right since prior study. Heart is normal size. IMPRESSION: Increasing right pleural effusion and right basilar atelectasis or infiltrate. Small left effusion and left base atelectasis. Electronically Signed   By: Rolm Baptise M.D.   On: 07/09/2019 15:46   Ir Thoracentesis Asp Pleural Space W/img Guide  Result Date: 07/20/2019 INDICATION: Patient with history of ovarian cancer, COPD on home O2, HF, and recurrent right-sided pleural effusion. Request is made for diagnostic and therapeutic right thoracentesis. EXAM: ULTRASOUND GUIDED DIAGNOSTIC AND THERAPEUTIC RIGHT THORACENTESIS MEDICATIONS: 10 mL 1% lidocaine COMPLICATIONS: None immediate. PROCEDURE: An ultrasound guided  thoracentesis was thoroughly discussed with the patient and questions answered. The benefits, risks, alternatives and complications were also discussed. The patient understands and wishes to proceed with the procedure. Written consent was obtained. Ultrasound was performed to localize and mark an adequate  pocket of fluid in the right chest. The area was then prepped and draped in the normal sterile fashion. 1% Lidocaine was used for local anesthesia. Under ultrasound guidance a 6 Fr Safe-T-Centesis catheter was introduced. Thoracentesis was performed. The catheter was removed and a dressing applied. FINDINGS: A total of approximately 600 mL of clear yellow fluid was removed. Samples were sent to the laboratory as requested by the clinical team. IMPRESSION: Successful ultrasound guided right thoracentesis yielding 600 mL of pleural fluid. Read by: Earley Abide, PA-C Electronically Signed   By: Markus Daft M.D.   On: 07/20/2019 11:16    All questions were answered. The patient knows to call the clinic with any problems, questions or concerns. No barriers to learning was detected.  I spent 30 minutes counseling the patient face to face. The total time spent in the appointment was 40 minutes and more than 50% was on counseling and review of test results  Heath Lark, MD 07/21/2019 9:31 AM

## 2019-07-21 NOTE — Assessment & Plan Note (Signed)
She has multifactorial pain, a combination of chronic joint pain with recent exacerbation of neuropathic pain from shingles and chemotherapy She has poor tolerance to pain medications She rated her pain at 7 or 8 out of 10 today Recently, we introduce long-acting morphine sulfate According to her daughter, while her pain appears under control, the patient was heavily sedated with some intermittent confusion However, with last pain medication, she has been calling with severe uncontrolled pain I felt that the chemotherapy, no doubt, had exacerbated her recent neuropathic pain We have very long and extensive discussion about chronic pain management Ultimately, the patient has decided not to proceed with morphine sulfate and once her pain medication to be reduced back to oxycodone 10 mg dose I refill her prescription today I warned her about risk of constipation

## 2019-07-21 NOTE — Assessment & Plan Note (Signed)
We had numerous goals of care discussions in the past The patient was adamant in the past to treat regardless of her performance status and health issues I try my best to explain to the patient, in the presence of her daughter the rationale of withholding treatment today given her poor performance status and the chemotherapy likely exacerbated a lot of her health issues Ultimately, the patient agreed to hold treatment today

## 2019-07-21 NOTE — Assessment & Plan Note (Signed)
She has worsening generalized weakness since recent discharge from the hospital Her performance status has deteriorated She has poorly controlled pain amongst other medical issues For now, we will continue supportive care and adjust her medication Hopefully, with withholding chemotherapy today, she has time to recover next month

## 2019-07-21 NOTE — Assessment & Plan Note (Signed)
She has worsening constipation since recent changes in her pain medication We discussed aggressive laxative therapy

## 2019-07-21 NOTE — Assessment & Plan Note (Signed)
This is multifactorial, likely anemia chronic disease as well as recurrent infection and recent chemotherapy She does not need transfusion support today

## 2019-07-21 NOTE — Telephone Encounter (Signed)
I talk with [name] regarding schedule

## 2019-07-21 NOTE — Assessment & Plan Note (Signed)
She is frail She has numerous hospitalization recently for recurrent COPD exacerbation/pneumonia She has poorly controlled pain due to recent shingles The patient is too weak with estimated ECOG performance status score of 3 I had a long discussion with the patient in the presence of her daughter I do not recommend we proceed with treatment until her performance status improves and she has no further hospitalization in 30 days from her recent discharge from the hospital We will provide supportive care only I will cancel her treatment today and reschedule to the first week of November

## 2019-07-22 LAB — CA 125: Cancer Antigen (CA) 125: 442 U/mL — ABNORMAL HIGH (ref 0.0–38.1)

## 2019-07-22 LAB — CHOLESTEROL, BODY FLUID: Cholesterol, Fluid: 46 mg/dL

## 2019-07-23 ENCOUNTER — Ambulatory Visit (HOSPITAL_COMMUNITY): Payer: Medicare Other

## 2019-07-23 ENCOUNTER — Telehealth: Payer: Self-pay | Admitting: Emergency Medicine

## 2019-07-23 NOTE — Telephone Encounter (Signed)
Dr. Lamonte Sakai, this patient had a video visit with you on 07/13/19. Can you review the results requested below by the patient and provide recommendations? Thank You.

## 2019-07-24 NOTE — Telephone Encounter (Signed)
LMTCB x1 for pt's daughter, [name].

## 2019-07-24 NOTE — Telephone Encounter (Signed)
Please let her know that her cytology on the pleural fluid is negative for any cancer cells. This is good news. We will have to decide whether there is any benefit to placing a Pleurx drain going forward depending on whether the fluid comes back, and how fast

## 2019-07-24 NOTE — Telephone Encounter (Signed)
Left detailed message for patient's daughter advising her that we are still awaiting the results and recommendations from [place].

## 2019-07-27 NOTE — Telephone Encounter (Signed)
Called and spoke to [name], patient's daughter who is listed on [place].  Gave her results per Dr. Lamonte Sakai. [name] verbalized understanding. She did not have any other questions at this time.

## 2019-07-27 NOTE — Telephone Encounter (Signed)
Returning call for Mendel Ryder - please CB # [phone]

## 2019-07-28 ENCOUNTER — Encounter: Payer: Self-pay | Admitting: Cardiology

## 2019-07-28 ENCOUNTER — Telehealth (INDEPENDENT_AMBULATORY_CARE_PROVIDER_SITE_OTHER): Payer: Medicare Other | Admitting: Cardiology

## 2019-07-28 DIAGNOSIS — I480 Paroxysmal atrial fibrillation: Secondary | ICD-10-CM

## 2019-07-28 NOTE — Patient Instructions (Signed)
Medication Instructions:   Your physician recommends that you continue on your current medications as directed. Please refer to the Current Medication list given to you today.  Labwork:  none  Testing/Procedures:  none  Follow-Up:  Your physician recommends that you schedule a follow-up appointment in: 3 months.  Any Other Special Instructions Will Be Listed Below (If Applicable).  If you need a refill on your cardiac medications before your next appointment, please call your pharmacy. 

## 2019-07-28 NOTE — Progress Notes (Signed)
Virtual Visit via Telephone Note   This visit type was conducted due to national recommendations for restrictions regarding the COVID-19 Pandemic (e.g. social distancing) in an effort to limit this patient's exposure and mitigate transmission in our community.  Due to her co-morbid illnesses, this patient is at least at moderate risk for complications without adequate follow up.  This format is felt to be most appropriate for this patient at this time.  The patient did not have access to video technology/had technical difficulties with video requiring transitioning to audio format only (telephone).  All issues noted in this document were discussed and addressed.  No physical exam could be performed with this format.  Please refer to the patient's chart for her  consent to telehealth for [facility].   Date:  07/28/2019   ID:  [name], DOB [birth year], MRN [id number]  Patient Location: Home Provider Location: Office  PCP:  Asencion Noble, MD  Cardiologist:  Rozann Lesches, MD Electrophysiologist:  None   Evaluation Performed:  Follow-Up Visit  Chief Complaint:   Cardiac follow-up  History of Present Illness:    [name] is a 85 y.o. female last seen in August.  We spoke by phone today.  She seems to be in reasonably good spirits.  She continues to follow with oncology for management of ovarian cancer.  I also see that she had an ultrasound-guided right thoracentesis in mid October.  She does not report any progression and palpitations.  I see that she had a recent ECG done on October 3 at which point she was in normal sinus rhythm.  She remains on Eliquis along with Cardizem CD at 180 mg twice daily.  I reviewed her recent lab work as outlined below.  The patient does not have symptoms concerning for COVID-19 infection (fever, chills, cough, or new shortness of breath).    Past Medical History:  Diagnosis Date  . Anxiety   . Chronic blood loss anemia    03-04-2018  diverticular bleed and rectal bleeding,  transfused 2 units PRBCs 03-08-2018  . Colitis   . COPD (chronic obstructive pulmonary disease) (HCC)    Dr. Lamonte Sakai  . Depression   . Diastolic CHF, chronic ([place])   . Diverticulosis   . Family history of colon cancer   . Fibromyalgia   . Genetic testing 04/07/2018   MyRisk (35 genes) @ [facility] - No pathogenic mutations detected  . GERD (gastroesophageal reflux disease)   . Hemorrhoids   . Hiatal hernia   . Hip pain 07/2018   Right Hip Pain  . History of rectal polyps   . History of shingles   . Hypothyroidism   . IBS (irritable bowel syndrome)   . Lymphocytic colitis    Dr. Henrene Pastor  . Malignant ascites    Admission 06/2017 abdominal s/p parencentesis 07-01-2017 2.5L, 07-08-2016  2.7L, 07-12-2017  1432m  . [place]   . On home O2   . Osteoporosis   . Ovarian cancer ([facility]    Chemotherapy - Dr. GAlvy Bimler . [place]    Xarelto stopped 03-07-2018 due to lower GI bleed  . Pleural effusion    s/p  right thoracentesis, 02-2018 1.3L and 03-17-2018 right thoracentesis 64105m, post cxr no residual effusion  . [place]iatic [place] ([place]  . Schatzki's ring 2013  . Seasonal allergic rhinitis    Past Surgical History:  Procedure Laterality Date  . CARDIOVASCULAR STRESS TEST  09/23/2012  Low risk lexiscan nuclear study w/ apical thinning but no evidence of ischemia/  normal LV function and wall motion , ef 75%  . CATARACT EXTRACTION W/ INTRAOCULAR LENS  IMPLANT, BILATERAL  10/2016  . CHEST TUBE INSERTION Right 06/24/2018   Procedure: INSERTION PLEURAL DRAINAGE CATHETER;  Surgeon: Grace Isaac, MD;  Location: [facility];  Service: Thoracic;  Laterality: Right;  . COLONOSCOPY    . DEBULKING N/A 03/20/2018   Procedure: DEBULKING;  Surgeon: Isabel Caprice, MD;  Location[facility];  Service: Gynecology;  Laterality: N/A;  . EXAM UNDER ANESTHESIA WITH MANIPULATION OF KNEE Left 12-20-2003  dr  Noemi Chapel   post TKA  . FEMUR IM NAIL Left 12/11/2013   Procedure: INTRAMEDULLARY (IM) NAIL FEMORAL;  Surgeon: [name], MD;  [facility];  Service: Orthopedics;  Laterality: Left;  . HYSTERECTOMY ABDOMINAL WITH SALPINGO-OOPHORECTOMY Bilateral 03/20/2018   Procedure: TOTAL HYSTERECTOMY ABDOMINAL WITH BILATERAL  SALPINGO-OOPHORECTOMY;  Surgeon: Isabel Caprice, MD;  [facility];  Service: Gynecology;  Laterality: Bilateral;  . IR FLUORO GUIDE PORT INSERTION RIGHT  07/22/2017  . IR PARACENTESIS  07/12/2017  . IR THORACENTESIS ASP PLEURAL SPACE W/IMG GUIDE  03/17/2018  . IR THORACENTESIS ASP PLEURAL SPACE W/IMG GUIDE  05/08/2018  . IR THORACENTESIS ASP PLEURAL SPACE W/IMG GUIDE  07/20/2019  . IR US GUIDE VASC ACCESS RIGHT  07/22/2017  . KNEE ARTHROSCOPY W/ LATERAL RELEASE Left 09-03-2005   dr Noemi Chapel  [facility]   w/  Lysis Adhesions,  excision loose body's  . LAPAROSCOPIC CHOLECYSTECTOMY  12-04-2010  dr zeigler  . LAPAROTOMY N/A 03/20/2018   Procedure: EXPLORATORY LAPAROTOMY;  Surgeon: Isabel Caprice, MD;  [facility];  Service: Gynecology;  Laterality: N/A;  . OMENTECTOMY N/A 03/20/2018   Procedure: OMENTECTOMY;  Surgeon: Isabel Caprice, MD;  [facility];  Service: Gynecology;  Laterality: N/A;  . OTHER SURGICAL HISTORY  06/24/2018   Plurex Catheter inserted into the lungs  . TOTAL KNEE ARTHROPLASTY Left 09-08-2003   dr Noemi Chapel  [facility]  . TOTAL KNEE REVISION  08/06/2012   Procedure: TOTAL KNEE REVISION;  Surgeon: [name], MD;  [facility];  Service: Orthopedics;  Laterality: Left;  Left Total Knee Arthroplasty Revision  . TRANSTHORACIC ECHOCARDIOGRAM  08/20/2017   ef 60-65%,  grade 1 diastolic dysfunction/  trivial AR and TR  . Uterine polypectomy       Current Meds  Medication Sig  . albuterol (PROAIR HFA) 108 (90 Base) MCG/ACT inhaler Inhale 2 puffs into the lungs every 6 (six) hours as needed for wheezing or shortness of breath.  . ALPRAZolam (XANAX) 0.25 MG  tablet Take 1 tablet (0.25 mg total) by mouth at bedtime as needed for anxiety.  Marland Kitchen azelastine (ASTELIN) 0.1 % nasal spray Place 2 sprays into both nostrils 2 (two) times daily as needed for allergies.   . Cholecalciferol (VITAMIN D) 2000 units CAPS Take 2,000 Units by mouth daily.   Marland Kitchen dicyclomine (BENTYL) 10 MG capsule TAKE 1 CAPSULE(10 MG) BY MOUTH TWICE DAILY (Patient taking differently: Take 10 mg by mouth 2 (two) times daily. )  . diltiazem (CARDIZEM CD) 180 MG 24 hr capsule Take 1 capsule (180 mg total) by mouth 2 (two) times a day.  . diphenhydrAMINE (BENADRYL) 25 mg capsule Take 25 mg by mouth every 8 (eight) hours as needed for itching.   . DULoxetine (CYMBALTA) 60 MG capsule Take 1 capsule (60 mg total) by mouth at bedtime.  . fluticasone (  FLONASE) 50 MCG/ACT nasal spray Place 2 sprays into both nostrils 2 (two) times daily as needed (FOR NASAL CONGESTION.).   Marland Kitchen furosemide (LASIX) 40 MG tablet Take 1 tablet (40 mg total) by mouth daily.  Marland Kitchen gabapentin (NEURONTIN) 300 MG capsule Take 1 capsule (300 mg total) by mouth at bedtime.  Marland Kitchen guaiFENesin (MUCINEX) 600 MG 12 hr tablet Take 1 tablet (600 mg total) by mouth 2 (two) times daily.  . halobetasol (ULTRAVATE) 0.05 % cream Apply 1 application topically 2 (two) times daily as needed (psoriasis).   . hydrocortisone valerate cream (WESTCORT) 0.2 % Apply 1 application topically 2 (two) times daily.  Marland Kitchen levothyroxine (SYNTHROID, LEVOTHROID) 137 MCG tablet Take 137 mcg by mouth daily before breakfast. For thyroid therapy  . Magnesium 400 MG CAPS Take 400 mg by mouth daily.   . mirtazapine (REMERON) 15 MG tablet Take 1 tablet (15 mg total) by mouth at bedtime.  . Oxycodone HCl 10 MG TABS Take 1 tablet (10 mg total) by mouth every 4 (four) hours as needed.  . polyvinyl alcohol (LIQUIFILM TEARS) 1.4 % ophthalmic solution Place 1 drop into both eyes 2 (two) times daily as needed for dry eyes.   . potassium chloride SA (K-DUR,KLOR-CON) 20 MEQ tablet TAKE 1  TABLET BY MOUTH DAILY AND TAKE 1 ADDITIONAL TABLET WHEN TAKING EXTRA LASIX DOSE (Patient taking differently: Take 20 mEq by mouth daily. )  . Rivaroxaban (XARELTO) 15 MG TABS tablet Take 15 mg by mouth daily.  . Tiotropium Bromide-Olodaterol (STIOLTO RESPIMAT) 2.5-2.5 MCG/ACT AERS Inhale 2 puffs into the lungs daily.     Allergies:   Codeine   Social History   Tobacco Use  . Smoking status: Former Smoker    Packs/day: 1.00    Years: 20.00    Pack years: 20.00    Types: Cigarettes    Quit date: 10/08/1980    Years since quitting: 38.8  . Smokeless tobacco: Never Used  Substance Use Topics  . Alcohol use: Not Currently    Comment: rarely, 12-23-15 rarely  . Drug use: No     Family Hx: The patient's family history includes Colon cancer in her maternal grandfather; Colon cancer (age of onset: 22) in her mother; Depression in her sister; Heart disease in her father; Leukemia in her maternal aunt; Lung cancer in her father.  ROS:   Please see the history of present illness. All other systems reviewed and are negative.   Prior CV studies:   The following studies were reviewed today:  Cardiac monitor 05/14/2019: 72-hour event recorder reviewed. Predominant rhythm is sinus with heart rate ranging from 55 bpm up to 119 bpm and average heart rate 89 bpm. Periods of paroxysmal atrial fibrillation and flutter were noted, with fastest nonsustained heart rate in the 160s. There were no spontaneous or posttermination pauses, and no unusual degree of bradycardia.  Cardiac catheterization 08/20/2017: Study Conclusions  - Left ventricle: The cavity size was normal. Wall thickness was normal. Systolic function was normal. The estimated ejection fraction was in the range of 60% to 65%. Wall motion was normal; there were no regional wall motion abnormalities. Doppler parameters are consistent with abnormal left ventricular relaxation (grade 1 diastolic dysfunction). - Aortic valve:  There was no stenosis. There was trivial regurgitation. - Mitral valve: Mildly calcified annulus. There was no significant regurgitation. - Right ventricle: The cavity size was normal. Systolic function was normal. - Tricuspid valve: Peak RV-RA gradient (S): 27 mm Hg. - Pulmonary arteries: PA peak  pressure: 30 mm Hg (S). - Inferior vena cava: The vessel was normal in size. The respirophasic diameter changes were in the normal range (= 50%), consistent with normal central venous pressure.  Impressions:  - Normal LV size with EF 60-65%. Normal RV size and systolic function. No significant valvular abnormalities.  Labs/Other Tests and Data Reviewed:    EKG:  An ECG dated 07/11/2019 was personally reviewed today and demonstrated:  Normal sinus rhythm.  Recent Labs: 11/24/2018: Magnesium 1.7 04/17/2019: B Natriuretic Peptide 115.0 06/26/2019: TSH 2.080 07/21/2019: ALT 10; BUN 13; Creatinine, Ser 1.13; Hemoglobin 8.4; Platelets 271; Potassium 3.6; Sodium 140   Recent Lipid Panel Lab Results  Component Value Date/Time   CHOL 150 06/26/2019 10:23 AM   TRIG 102 06/26/2019 10:23 AM   HDL 38 (L) 06/26/2019 10:23 AM   CHOLHDL 3.9 06/26/2019 10:23 AM   LDLCALC 92 06/26/2019 10:23 AM    Wt Readings from Last 3 Encounters:  07/28/19 161 lb (73 kg)  07/21/19 164 lb 12.8 oz (74.8 kg)  06/26/19 176 lb 9.6 oz (80.1 kg)     Objective:    Vital Signs:  BP (!) 134/58   Pulse 85   Wt 161 lb (73 kg)   BMI 27.64 kg/m    Patient spoke in full sentences, not obviously short of breath at rest. No audible wheezing or coughing.  ASSESSMENT & PLAN:    1.  Paroxysmal atrial fibrillation with CHADSVASC score 3.  She reports adequate symptom control on current regimen including Cardizem CD 180 mg twice daily and Xarelto for stroke prophylaxis.  Recent ECG reviewed at which point she was in sinus rhythm.  Plan to continue present treatment.  2.  Ovarian cancer undergoing treatment  per oncology.  3.  Recurring right pleural effusion.  She underwent recent ultrasound-guided thoracentesis with follow-up by Dr. Lamonte Sakai.   COVID-19 Education: The signs and symptoms of COVID-19 were discussed with the patient and how to seek care for testing (follow up with PCP or arrange E-visit).  The importance of social distancing was discussed today.  Time:   Today, I have spent 6 minutes with the patient with telehealth technology discussing the above problems.     Medication Adjustments/Labs and Tests Ordered: Current medicines are reviewed at length with the patient today.  Concerns regarding medicines are outlined above.   Tests Ordered: No orders of the defined types were placed in this encounter.   Medication Changes: No orders of the defined types were placed in this encounter.   Follow Up:  Either In Person or Virtual 3 months in the [place] office.  Signed, Rozann Lesches, MD  07/28/2019 3:30 PM    [place]

## 2019-07-29 ENCOUNTER — Ambulatory Visit (INDEPENDENT_AMBULATORY_CARE_PROVIDER_SITE_OTHER): Payer: Medicare Other | Admitting: Primary Care

## 2019-07-29 ENCOUNTER — Other Ambulatory Visit: Payer: Self-pay

## 2019-07-29 ENCOUNTER — Encounter: Payer: Self-pay | Admitting: Primary Care

## 2019-07-29 DIAGNOSIS — J209 Acute bronchitis, unspecified: Secondary | ICD-10-CM | POA: Diagnosis not present

## 2019-07-29 DIAGNOSIS — J90 Pleural effusion, not elsewhere classified: Secondary | ICD-10-CM

## 2019-07-29 MED ORDER — PREDNISONE 10 MG PO TABS: ORAL_TABLET | ORAL | 0 refills | Status: DC

## 2019-07-29 MED ORDER — GUAIFENESIN ER 1200 MG PO TB12: 1.0000 | ORAL_TABLET | Freq: Two times a day (BID) | ORAL | 0 refills | Status: DC

## 2019-07-29 NOTE — Patient Instructions (Signed)
Orders: CXR to monitor pleural effusion  Recommendations: Advise mucinex double strength twice daily Prednisone taper as directed for wheezing  Follow-up: 1 week in office with Beth NP  ED if symptoms acutely worsen

## 2019-07-29 NOTE — Progress Notes (Signed)
Virtual Visit via Telephone Note  I connected with [name] on 07/29/19 at  2:45 PM EDT by telephone and verified that I am speaking with the correct person using two identifiers.  Location: Patient: Home Provider: Office   I discussed the limitations, risks, security and privacy concerns of performing an evaluation and management service by telephone and the availability of in person appointments. I also discussed with the patient that there may be a patient responsible charge related to this service. The patient expressed understanding and agreed to proceed.   History of Present Illness: 85 year old female, former smoker. PMH significant for COPD, chronic rhinitis, chronic cough, afib (on xarelto), ovarian cancer undergoing chemotherapy, right pleural effusion-pleurex cath removed May 2020 (cytology neg for malignant cells). Followed by thoracic surgery. Patient of Dr. Lamonte Sakai, last seen on 05/11/19. Stable during last visit. Ongoing dyspnea felt to be multifactorial. Maintained on Stiolto and uses 2L oxygen. CT in July showed moderate right pleural effusion and small left pleural effusion. Emphysematous changes. Monitor through serial scans/xrays. Holding off on further thoracentesis or Pleurx for now. Recommend follow-up in 3 months.   Previous [facility] pulmonary encounter: 07/08/2019 Patient contacted today for virtual video visit. She is accompanied by her aid and daughter on phone. Reports increased chest congestion and productive cough x 3-4 days. States that she is getting mucus up but there isn't much color to it. She had an episode of shortness of breath this morning before taking her Stiolto inhaler. She is fine when sitting at rest. Recently treated with prednisone taper for shingles. Reports burning to bilateral lungs. Denies chest tightness or wheezing.   07/13/19- Video visit, Dr. Lamonte Sakai Admitted early October for right pneumonia and chronic bilateral pleural effusion. Treated with  azithromycin, cefdinir and continued on prednisone. Remains on stiolto,, albuterol for COPD regimen. She is on oxygen 2-3L. May be slightly improved since hospitalization but remains dyspneic and clearly worse than her baseline. Referred to IR for right thoracentesis with cytology, culture and labs performed on fluid.   07/29/2019 Patient contacted today for televisit. She had right thoracentesis on 10/12 with interventional radiology. Removed 675m clear yellow fluid. CXR post procedure showed no pneumothorax following thoracentesis, reduction in right pleural effusion. Reports increased productive cough with yellow mucus and wheezing x7 days. Temp 99.2. Taking stiolto daily as prescribed, using albuterol once a day. Her shortness of breath improved after thoracentesis for a few days. Denies chest pain/tightness or hemoptysis. Covid negative.  Observations/Objective:  - No observed shortness of breath, wheezing or cough during phone call   Assessment and Plan:  Acute bronchitis with bronchospasm - Productive cough with yellow mucus x 7 days  - Recently treated for suspect CAP with azithromycin and cefdinir  - Advise mucinex 1,2051mtwice daily - Rx prednisone 4098m 2 days; 9m39m2 days; 20mg31m days; 10mg 28mdays  Pleural effusion - S/p thoracentesis on 10/12, removed 600ml -22mmalignant cells, Reactive mesothelial cells present, chronic inflammation, - CXR to monitor for re-accumulation  Follow Up Instructions:   - FU in 1 week in office with Beth NP; ED if symptoms acutely worsen   I discussed the assessment and treatment plan with the patient. The patient was provided an opportunity to ask questions and all were answered. The patient agreed with the plan and demonstrated an understanding of the instructions.   The patient was advised to call back or seek an in-person evaluation if the symptoms worsen or  if the condition fails to improve as anticipated.  I provided 22 minutes of  non-face-to-face time during this encounter.   Martyn Ehrich, NP

## 2019-07-30 ENCOUNTER — Ambulatory Visit (HOSPITAL_COMMUNITY)
Admission: RE | Admit: 2019-07-30 | Discharge: 2019-07-30 | Disposition: A | Discharge status: Home / Self Care | Payer: Medicare Other | Source: Ambulatory Visit | Attending: Primary Care | Admitting: Primary Care

## 2019-07-30 ENCOUNTER — Other Ambulatory Visit: Payer: Self-pay

## 2019-07-30 DIAGNOSIS — J90 Pleural effusion, not elsewhere classified: Secondary | ICD-10-CM | POA: Insufficient documentation

## 2019-07-30 NOTE — Progress Notes (Signed)
Please let patient know CXR looked reassuring. No evidence of pneumonia, small pleural effusions. Follow up next week or sooner if needed

## 2019-07-31 ENCOUNTER — Telehealth: Payer: Self-pay | Admitting: Primary Care

## 2019-07-31 NOTE — Telephone Encounter (Signed)
Called back and spoke with patient daughter [name]. Advised of results. Nothing further needed at this time.

## 2019-08-10 ENCOUNTER — Ambulatory Visit: Payer: Medicare Other | Admitting: Primary Care

## 2019-08-10 NOTE — Progress Notes (Deleted)
@Patient  ID: [name], female    DOB: 1932/12/09, 85 y.o.   MRN: [id number]  No chief complaint on file.   Referring provider: Asencion Noble, MD  HPI: 85 year old female, former smoker. PMH significant for COPD, chronic rhinitis, chronic cough, afib (on xarelto), ovarian cancer undergoing chemotherapy, right pleural effusion-pleurex cath removed May 2020 (cytology neg for malignant cells). Followed by thoracic surgery. Patient of Dr. Lamonte Sakai, last seen on 05/11/19. Stable during last visit. Ongoing dyspnea felt to be multifactorial. Maintained on Stiolto and uses 2L oxygen. CT in July showed moderate right pleural effusion and small left pleural effusion. Emphysematous changes. Monitor through serial scans/xrays. Holding off on further thoracentesis or Pleurx for now. Recommend follow-up in 3 months.   Previous [facility] pulmonary encounter: 07/08/2019 Patient contacted today for virtual video visit. She is accompanied by her aid and daughter on phone. Reports increased chest congestion and productive cough x 3-4 days. States that she is getting mucus up but there isn't much color to it. She had an episode of shortness of breath this morning before taking her Stiolto inhaler. She is fine when sitting at rest. Recently treated with prednisone taper for shingles. Reports burning to bilateral lungs. Denies chest tightness or wheezing.   07/13/19- Video visit, Dr. Lamonte Sakai Admitted early October for right pneumonia and chronic bilateral pleural effusion. Treated with azithromycin, cefdinir and continued on prednisone. Remains on stiolto,, albuterol for COPD regimen. She is on oxygen 2-3L. May be slightly improved since hospitalization but remains dyspneic and clearly worse than her baseline. Referred to IR for right thoracentesis with cytology, culture and labs performed on fluid.   07/29/2019 Patient contacted today for televisit. She had right thoracentesis on 10/12 with interventional radiology. Removed  666m clear yellow fluid. CXR post procedure showed no pneumothorax following thoracentesis, reduction in right pleural effusion. Reports increased productive cough with yellow mucus and wheezing x7 days. Temp 99.2. Taking stiolto daily as prescribed, using albuterol once a day. Her shortness of breath improved after thoracentesis for a few days. Denies chest pain/tightness or hemoptysis. Covid negative. Recently treated for suspected CAP with azithromycin and cefdinir. Ordered for CXR. Advised mucinex 1,2054mtwice daily. Rx sent for prednisone taper for acute bronchitis with bronchospasm.   08/11/2019 Patient presents today for 1 week follow-up. CXR on 07/30/19 showed mild bibasilar subsegmental atelectasis is noted with small pleural effusions.   Allergies  Allergen Reactions  . Codeine Itching    Immunization History  Administered Date(s) Administered  . Fluad Quad(high Dose 65+) 07/21/2019  . Influenza Split 07/09/2011, 08/19/2013, 06/26/2014  . Influenza Whole 07/08/2009, 07/08/2010, 07/10/2012  . Influenza, High Dose Seasonal PF 07/09/2017  . Influenza,inj,Quad PF,6+ Mos 07/09/2015, 07/07/2016  . Influenza-Unspecified 07/08/2018  . Pneumococcal Conjugate-13 07/26/2014  . Pneumococcal Polysaccharide-23 07/08/2008    Past Medical History:  Diagnosis Date  . Anxiety   . Chronic blood loss anemia    03-04-2018 diverticular bleed and rectal bleeding,  transfused 2 units PRBCs 03-08-2018  . Colitis   . COPD (chronic obstructive pulmonary disease) (HCC)    Dr. ByLamonte Sakai. Depression   . Diastolic CHF, chronic ([facility]  . Diverticulosis   . Family history of colon cancer   . Fibromyalgia   . Genetic testing 04/07/2018   MyRisk (35 genes) @ [facility] - No pathogenic mutations detected  . GERD (gastroesophageal reflux disease)   . Hemorrhoids   . Hiatal hernia   . Hip pain 07/2018   Right Hip Pain  .  History of rectal polyps   . History of shingles   . Hypothyroidism   . IBS  (irritable bowel syndrome)   . Lymphocytic colitis    Dr. Henrene Pastor  . Malignant ascites    Admission 06/2017 abdominal s/p parencentesis 07-01-2017 2.5L, 07-08-2016  2.7L, 07-12-2017  1497m  . Neuropathy due to chemotherapeutic drug ([facility]   . On home O2   . Osteoporosis   . Ovarian cancer ([facility]    Chemotherapy - Dr. GAlvy Bimler . Paroxysmal atrial fibrillation ([facility]    Xarelto stopped 03-07-2018 due to lower GI bleed  . Pleural effusion    s/p  right thoracentesis, 02-2018 1.3L and 03-17-2018 right thoracentesis 6461m, post cxr no residual effusion  . [place]iatic [place] ([facility]  . Schatzki's ring 2013  . Seasonal allergic rhinitis     Tobacco History: Social History   Tobacco Use  Smoking Status Former Smoker  . Packs/day: 1.00  . Years: 20.00  . Pack years: 20.00  . Types: Cigarettes  . Quit date: 10/08/1980  . Years since quitting: 38.8  Smokeless Tobacco Never Used   Counseling given: Not Answered   Outpatient Medications Prior to Visit  Medication Sig Dispense Refill  . albuterol (PROAIR HFA) 108 (90 Base) MCG/ACT inhaler Inhale 2 puffs into the lungs every 6 (six) hours as needed for wheezing or shortness of breath. 18 g 5  . ALPRAZolam (XANAX) 0.25 MG tablet Take 1 tablet (0.25 mg total) by mouth at bedtime as needed for anxiety. 5 tablet 0  . azelastine (ASTELIN) 0.1 % nasal spray Place 2 sprays into both nostrils 2 (two) times daily as needed for allergies.     . Cholecalciferol (VITAMIN D) 2000 units CAPS Take 2,000 Units by mouth daily.     . Marland Kitchenicyclomine (BENTYL) 10 MG capsule TAKE 1 CAPSULE(10 MG) BY MOUTH TWICE DAILY (Patient taking differently: Take 10 mg by mouth 2 (two) times daily. ) 60 capsule 1  . diltiazem (CARDIZEM CD) 180 MG 24 hr capsule Take 1 capsule (180 mg total) by mouth 2 (two) times a day. 180 capsule 3  . diphenhydrAMINE (BENADRYL) 25 mg capsule Take 25 mg by mouth every 8 (eight) hours as needed for itching.     . DULoxetine (CYMBALTA) 60 MG  capsule Take 1 capsule (60 mg total) by mouth at bedtime. 30 capsule 3  . fluticasone (FLONASE) 50 MCG/ACT nasal spray Place 2 sprays into both nostrils 2 (two) times daily as needed (FOR NASAL CONGESTION.).     . Marland Kitchenurosemide (LASIX) 40 MG tablet Take 1 tablet (40 mg total) by mouth daily. 90 tablet 3  . gabapentin (NEURONTIN) 300 MG capsule Take 1 capsule (300 mg total) by mouth at bedtime. (Patient taking differently: Take 300 mg by mouth 2 (two) times daily. ) 30 capsule 0  . Guaifenesin (MUCINEX MAXIMUM STRENGTH) 1200 MG TB12 Take 1 tablet (1,200 mg total) by mouth 2 (two) times daily. 30 tablet 0  . halobetasol (ULTRAVATE) 0.05 % cream Apply 1 application topically 2 (two) times daily as needed (psoriasis).     . hydrocortisone valerate cream (WESTCORT) 0.2 % Apply 1 application topically 2 (two) times daily. 45 g 0  . levothyroxine (SYNTHROID, LEVOTHROID) 137 MCG tablet Take 137 mcg by mouth daily before breakfast. For thyroid therapy    . Magnesium 400 MG CAPS Take 400 mg by mouth daily.     . mirtazapine (REMERON) 15 MG tablet Take 1 tablet (15 mg total) by mouth at  bedtime. 30 tablet 9  . Oxycodone HCl 10 MG TABS Take 1 tablet (10 mg total) by mouth every 4 (four) hours as needed. 90 tablet 0  . polyvinyl alcohol (LIQUIFILM TEARS) 1.4 % ophthalmic solution Place 1 drop into both eyes 2 (two) times daily as needed for dry eyes.     . potassium chloride SA (K-DUR,KLOR-CON) 20 MEQ tablet TAKE 1 TABLET BY MOUTH DAILY AND TAKE 1 ADDITIONAL TABLET WHEN TAKING EXTRA LASIX DOSE (Patient taking differently: Take 20 mEq by mouth daily. ) 60 tablet 6  . predniSONE (DELTASONE) 10 MG tablet Take 4 tabs po daily x 2 days; then 3 tabs for 2 days; then 2 tabs for 2 days; then 1 tab for 2 days 20 tablet 0  . Rivaroxaban (XARELTO) 15 MG TABS tablet Take 15 mg by mouth daily.    . Tiotropium Bromide-Olodaterol (STIOLTO RESPIMAT) 2.5-2.5 MCG/ACT AERS Inhale 2 puffs into the lungs daily. 4 g 11    Facility-Administered Medications Prior to Visit  Medication Dose Route Frequency Provider Last Rate Last Dose  . 0.9 %  sodium chloride infusion (Manually program via Guardrails IV Fluids)  250 mL Intravenous Once Heath Lark, MD   Stopped at 12/16/18 1500      Review of Systems  Review of Systems   Physical Exam  There were no vitals taken for this visit. Physical Exam   Lab Results:  CBC    Component Value Date/Time   WBC 11.6 (H) 07/21/2019 0822   RBC 3.53 (L) 07/21/2019 0822   HGB 8.4 (L) 07/21/2019 0822   HGB 7.6 (L) 06/24/2019 0848   HGB 10.8 (L) 09/24/2017 0945   HCT 28.4 (L) 07/21/2019 0822   HCT 32.8 (L) 09/24/2017 0945   PLT 271 07/21/2019 0822   PLT 326 06/24/2019 0848   PLT 174 09/24/2017 0945   MCV 80.5 07/21/2019 0822   MCV 93.2 09/24/2017 0945   MCH 23.8 (L) 07/21/2019 0822   MCHC 29.6 (L) 07/21/2019 0822   RDW 19.5 (H) 07/21/2019 0822   RDW 18.7 (H) 09/24/2017 0945   LYMPHSABS 1.5 07/21/2019 0822   LYMPHSABS 0.9 09/24/2017 0945   MONOABS 1.0 07/21/2019 0822   MONOABS 0.1 09/24/2017 0945   EOSABS 0.2 07/21/2019 0822   EOSABS 0.1 09/24/2017 0945   BASOSABS 0.0 07/21/2019 0822   BASOSABS 0.0 09/24/2017 0945    BMET    Component Value Date/Time   NA 140 07/21/2019 0822   NA 135 (L) 09/24/2017 0945   K 3.6 07/21/2019 0822   K 3.7 09/24/2017 0945   CL 97 (L) 07/21/2019 0822   CO2 33 (H) 07/21/2019 0822   CO2 30 (H) 09/24/2017 0945   GLUCOSE 140 (H) 07/21/2019 0822   GLUCOSE 101 09/24/2017 0945   BUN 13 07/21/2019 0822   BUN 15.3 09/24/2017 0945   CREATININE 1.13 (H) 07/21/2019 0822   CREATININE 1.11 (H) 06/24/2019 0848   CREATININE 0.8 09/24/2017 0945   CALCIUM 8.3 (L) 07/21/2019 0822   CALCIUM 9.1 09/24/2017 0945   GFRNONAA 45 (L) 07/21/2019 0822   GFRNONAA 46 (L) 06/24/2019 0848   GFRAA 52 (L) 07/21/2019 0822   GFRAA 53 (L) 06/24/2019 0848    BNP    Component Value Date/Time   BNP 115.0 (H) 04/17/2019 0918   BNP 51.2  09/05/2015 1118    ProBNP    Component Value Date/Time   PROBNP 837 (H) 08/12/2017 1137    Imaging: Dg Chest 1 View  Result Date: 07/20/2019  CLINICAL DATA:  Pleural effusion; post thoracentesis. Hx of paroxysmal afib, diastolic CHF, COPD. Former [id number]).pleural effusion EXAM: CHEST  1 VIEW COMPARISON:  Chest radiograph 07/09/2019 FINDINGS: Port in the anterior chest wall with tip in distal SVC. Interval decrease in RIGHT pleural effusion following thoracentesis. No pneumothorax. Small LEFT effusion remains. IMPRESSION: 1. No pneumothorax following RIGHT thoracentesis. 2. Reduction in RIGHT pleural effusion. Electronically Signed   By: Suzy Bouchard M.D.   On: 07/20/2019 11:07   Dg Chest 2 View  Result Date: 07/30/2019 CLINICAL DATA:  Pleural effusion, cough. EXAM: CHEST - 2 VIEW COMPARISON:  July 20, 2019. FINDINGS: The heart size and mediastinal contours are within normal limits. Right internal jugular Port-A-Cath is unchanged in position. No pneumothorax is noted. Mild bibasilar subsegmental atelectasis is noted with small pleural effusions. The visualized skeletal structures are unremarkable. IMPRESSION: Mild bibasilar subsegmental atelectasis is noted with small pleural effusions. Aortic Atherosclerosis (ICD10-I70.0). Electronically Signed   By: Marijo Conception M.D.   On: 07/30/2019 11:01   Ir Thoracentesis Asp Pleural Space W/img Guide  Result Date: 07/20/2019 INDICATION: Patient with history of ovarian cancer, COPD on home O2, HF, and recurrent right-sided pleural effusion. Request is made for diagnostic and therapeutic right thoracentesis. EXAM: ULTRASOUND GUIDED DIAGNOSTIC AND THERAPEUTIC RIGHT THORACENTESIS MEDICATIONS: 10 mL 1% lidocaine COMPLICATIONS: None immediate. PROCEDURE: An ultrasound guided thoracentesis was thoroughly discussed with the patient and questions answered. The benefits, risks, alternatives and complications were also discussed. The patient understands  and wishes to proceed with the procedure. Written consent was obtained. Ultrasound was performed to localize and mark an adequate pocket of fluid in the right chest. The area was then prepped and draped in the normal sterile fashion. 1% Lidocaine was used for local anesthesia. Under ultrasound guidance a 6 Fr Safe-T-Centesis catheter was introduced. Thoracentesis was performed. The catheter was removed and a dressing applied. FINDINGS: A total of approximately 600 mL of clear yellow fluid was removed. Samples were sent to the laboratory as requested by the clinical team. IMPRESSION: Successful ultrasound guided right thoracentesis yielding 600 mL of pleural fluid. Read by: Earley Abide, PA-C Electronically Signed   By: Markus Daft M.D.   On: 07/20/2019 11:16     Assessment & Plan:   No problem-specific Assessment & Plan notes found for this encounter.     Martyn Ehrich, NP 08/10/2019

## 2019-08-11 ENCOUNTER — Ambulatory Visit: Payer: Medicare Other | Admitting: Primary Care

## 2019-08-14 ENCOUNTER — Inpatient Hospital Stay: Payer: Medicare Other | Attending: Hematology and Oncology

## 2019-08-14 ENCOUNTER — Inpatient Hospital Stay: Payer: Medicare Other

## 2019-08-14 ENCOUNTER — Encounter: Payer: Self-pay | Admitting: Hematology and Oncology

## 2019-08-14 ENCOUNTER — Other Ambulatory Visit: Payer: Self-pay | Admitting: Hematology and Oncology

## 2019-08-14 ENCOUNTER — Inpatient Hospital Stay (HOSPITAL_BASED_OUTPATIENT_CLINIC_OR_DEPARTMENT_OTHER): Payer: Medicare Other | Admitting: Hematology and Oncology

## 2019-08-14 ENCOUNTER — Telehealth: Payer: Self-pay | Admitting: Hematology and Oncology

## 2019-08-14 ENCOUNTER — Other Ambulatory Visit: Payer: Self-pay

## 2019-08-14 DIAGNOSIS — J90 Pleural effusion, not elsewhere classified: Secondary | ICD-10-CM | POA: Diagnosis not present

## 2019-08-14 DIAGNOSIS — G893 Neoplasm related pain (acute) (chronic): Secondary | ICD-10-CM | POA: Diagnosis not present

## 2019-08-14 DIAGNOSIS — D649 Anemia, unspecified: Secondary | ICD-10-CM | POA: Diagnosis not present

## 2019-08-14 DIAGNOSIS — D638 Anemia in other chronic diseases classified elsewhere: Secondary | ICD-10-CM

## 2019-08-14 DIAGNOSIS — C786 Secondary malignant neoplasm of retroperitoneum and peritoneum: Secondary | ICD-10-CM | POA: Diagnosis not present

## 2019-08-14 DIAGNOSIS — C562 Malignant neoplasm of left ovary: Secondary | ICD-10-CM

## 2019-08-14 DIAGNOSIS — J449 Chronic obstructive pulmonary disease, unspecified: Secondary | ICD-10-CM

## 2019-08-14 DIAGNOSIS — Z9221 Personal history of antineoplastic chemotherapy: Secondary | ICD-10-CM | POA: Insufficient documentation

## 2019-08-14 DIAGNOSIS — I700 Atherosclerosis of aorta: Secondary | ICD-10-CM | POA: Diagnosis not present

## 2019-08-14 DIAGNOSIS — Z7189 Other specified counseling: Secondary | ICD-10-CM

## 2019-08-14 DIAGNOSIS — Z79899 Other long term (current) drug therapy: Secondary | ICD-10-CM | POA: Diagnosis not present

## 2019-08-14 LAB — CBC WITH DIFFERENTIAL/PLATELET
Abs Immature Granulocytes: 0.19 10*3/uL — ABNORMAL HIGH (ref 0.00–0.07)
Basophils Absolute: 0 10*3/uL (ref 0.0–0.1)
Basophils Relative: 0 %
Eosinophils Absolute: 0.1 10*3/uL (ref 0.0–0.5)
Eosinophils Relative: 1 %
HCT: 24.5 % — ABNORMAL LOW (ref 36.0–46.0)
Hemoglobin: 6.9 g/dL — CL (ref 12.0–15.0)
Immature Granulocytes: 1 %
Lymphocytes Relative: 12 %
Lymphs Abs: 1.6 10*3/uL (ref 0.7–4.0)
MCH: 21.6 pg — ABNORMAL LOW (ref 26.0–34.0)
MCHC: 28.2 g/dL — ABNORMAL LOW (ref 30.0–36.0)
MCV: 76.6 fL — ABNORMAL LOW (ref 80.0–100.0)
Monocytes Absolute: 1.2 10*3/uL — ABNORMAL HIGH (ref 0.1–1.0)
Monocytes Relative: 9 %
Neutro Abs: 10.3 10*3/uL — ABNORMAL HIGH (ref 1.7–7.7)
Neutrophils Relative %: 77 %
Platelets: 272 10*3/uL (ref 150–400)
RBC: 3.2 MIL/uL — ABNORMAL LOW (ref 3.87–5.11)
RDW: 18.9 % — ABNORMAL HIGH (ref 11.5–15.5)
WBC: 13.4 10*3/uL — ABNORMAL HIGH (ref 4.0–10.5)
nRBC: 0 % (ref 0.0–0.2)

## 2019-08-14 LAB — COMPREHENSIVE METABOLIC PANEL
ALT: 8 U/L (ref 0–44)
AST: 14 U/L — ABNORMAL LOW (ref 15–41)
Albumin: 3 g/dL — ABNORMAL LOW (ref 3.5–5.0)
Alkaline Phosphatase: 65 U/L (ref 38–126)
Anion gap: 11 (ref 5–15)
BUN: 16 mg/dL (ref 8–23)
CO2: 34 mmol/L — ABNORMAL HIGH (ref 22–32)
Calcium: 8.6 mg/dL — ABNORMAL LOW (ref 8.9–10.3)
Chloride: 90 mmol/L — ABNORMAL LOW (ref 98–111)
Creatinine, Ser: 1.38 mg/dL — ABNORMAL HIGH (ref 0.44–1.00)
GFR calc Af Amer: 41 mL/min — ABNORMAL LOW (ref >60)
GFR calc non Af Amer: 35 mL/min — ABNORMAL LOW (ref >60)
Glucose, Bld: 112 mg/dL — ABNORMAL HIGH (ref 70–99)
Potassium: 3.4 mmol/L — ABNORMAL LOW (ref 3.5–5.1)
Sodium: 135 mmol/L (ref 135–145)
Total Bilirubin: 0.4 mg/dL (ref 0.3–1.2)
Total Protein: 6.8 g/dL (ref 6.5–8.1)

## 2019-08-14 LAB — SAMPLE TO BLOOD BANK

## 2019-08-14 LAB — PREPARE RBC (CROSSMATCH)

## 2019-08-14 MED ORDER — DIPHENHYDRAMINE HCL 25 MG PO CAPS
25.0000 mg | ORAL_CAPSULE | Freq: Once | ORAL | Status: AC
  Administered 2019-08-14: 25 mg via ORAL

## 2019-08-14 MED ORDER — HEPARIN SOD (PORK) LOCK FLUSH 100 UNIT/ML IV SOLN
500.0000 [IU] | Freq: Every day | INTRAVENOUS | Status: AC | PRN
  Administered 2019-08-14: 17:00:00 500 [IU]
  Filled 2019-08-14: qty 5

## 2019-08-14 MED ORDER — ACETAMINOPHEN 325 MG PO TABS
ORAL_TABLET | ORAL | Status: AC
  Filled 2019-08-14: qty 2

## 2019-08-14 MED ORDER — HEPARIN SOD (PORK) LOCK FLUSH 100 UNIT/ML IV SOLN
250.0000 [IU] | INTRAVENOUS | Status: DC | PRN
  Filled 2019-08-14: qty 5

## 2019-08-14 MED ORDER — DIPHENHYDRAMINE HCL 25 MG PO CAPS
ORAL_CAPSULE | ORAL | Status: AC
  Filled 2019-08-14: qty 1

## 2019-08-14 MED ORDER — SODIUM CHLORIDE 0.9% FLUSH
3.0000 mL | INTRAVENOUS | Status: AC | PRN
  Administered 2019-08-14: 3 mL
  Filled 2019-08-14: qty 10

## 2019-08-14 MED ORDER — SODIUM CHLORIDE 0.9% FLUSH
10.0000 mL | Freq: Once | INTRAVENOUS | Status: AC
  Administered 2019-08-14: 10 mL
  Filled 2019-08-14: qty 10

## 2019-08-14 MED ORDER — SODIUM CHLORIDE 0.9% IV SOLUTION
250.0000 mL | Freq: Once | INTRAVENOUS | Status: AC
  Administered 2019-08-14: 250 mL via INTRAVENOUS
  Filled 2019-08-14: qty 250

## 2019-08-14 MED ORDER — ACETAMINOPHEN 325 MG PO TABS
650.0000 mg | ORAL_TABLET | Freq: Once | ORAL | Status: AC
  Administered 2019-08-14: 650 mg via ORAL

## 2019-08-14 MED ORDER — ACETAMINOPHEN 325 MG PO TABS
ORAL_TABLET | ORAL | Status: AC
  Filled 2019-08-14: qty 1

## 2019-08-14 NOTE — Assessment & Plan Note (Signed)
She continues to have poorly controlled pain, exacerbated by recent shingles She will continue oxycodone as prescribed Her primary care doctor has recently started her on gabapentin which she tolerated poorly due to excessive sedation She will continue her pain medication as prescribed

## 2019-08-14 NOTE — Telephone Encounter (Signed)
Confirmed 11/24 appointment with patient daughter.

## 2019-08-14 NOTE — Assessment & Plan Note (Signed)
She has mild COPD exacerbation with scattered wheezes and shortness of breath, likely worsened by severe anemia She will continue medical management

## 2019-08-14 NOTE — Patient Instructions (Signed)
Blood Transfusion, Adult, Care After This sheet gives you information about how to care for yourself after your procedure. Your doctor may also give you more specific instructions. If you have problems or questions, contact your doctor. Follow these instructions at home:   Take over-the-counter and prescription medicines only as told by your doctor.  Go back to your normal activities as told by your doctor.  Follow instructions from your doctor about how to take care of the area where an IV tube was put into your vein (insertion site). Make sure you: ? Wash your hands with soap and water before you change your bandage (dressing). If there is no soap and water, use hand sanitizer. ? Change your bandage as told by your doctor.  Check your IV insertion site every day for signs of infection. Check for: ? More redness, swelling, or pain. ? More fluid or blood. ? Warmth. ? Pus or a bad smell. Contact a doctor if:  You have more redness, swelling, or pain around the IV insertion site.  You have more fluid or blood coming from the IV insertion site.  Your IV insertion site feels warm to the touch.  You have pus or a bad smell coming from the IV insertion site.  Your pee (urine) turns pink, red, or brown.  You feel weak after doing your normal activities. Get help right away if:  You have signs of a serious allergic or body defense (immune) system reaction, including: ? Itchiness. ? Hives. ? Trouble breathing. ? Anxiety. ? Pain in your chest or lower back. ? Fever, flushing, and chills. ? Fast pulse. ? Rash. ? Watery poop (diarrhea). ? Throwing up (vomiting). ? Dark pee. ? Serious headache. ? Dizziness. ? Stiff neck. ? Yellow color in your face or the white parts of your eyes (jaundice). Summary  After a blood transfusion, return to your normal activities as told by your doctor.  Every day, check for signs of infection where the IV tube was put into your vein.  Some  signs of infection are warm skin, more redness and pain, more fluid or blood, and pus or a bad smell where the needle went in.  Contact your doctor if you feel weak or have any unusual symptoms. This information is not intended to replace advice given to you by your health care provider. Make sure you discuss any questions you have with your health care provider. Document Released: 10/15/2014 Document Revised: 01/29/2018 Document Reviewed: 05/18/2016 Elsevier Patient Education  2020 [employer]  

## 2019-08-14 NOTE — Assessment & Plan Note (Signed)
This is multifactorial, likely anemia chronic disease, chronic kidney disease as well as recent chemotherapy We discussed some of the risks, benefits, and alternatives of blood transfusions. The patient is symptomatic from anemia and the hemoglobin level is critically low.  Some of the side-effects to be expected including risks of transfusion reactions, chills, infection, syndrome of volume overload and risk of hospitalization from various reasons and the patient is willing to proceed and went ahead to sign consent today. I will proceed with 2 units of blood today

## 2019-08-14 NOTE — Assessment & Plan Note (Signed)
She is still quite weak with poor performance status I recommend we continue supportive care only We will proceed with transfusion support I plan to see her again in 2 weeks for further follow-up

## 2019-08-14 NOTE — Assessment & Plan Note (Signed)
The patient continues to express concern that her chemotherapy is on hold I tried to redirect her focus of attention to managing all her other health issues Her estimated ECOG performance status score is 3 and we need her to improve further before we can resume chemotherapy The patient and her daughter both understand

## 2019-08-14 NOTE — Progress Notes (Signed)
[place] [facility] OFFICE PROGRESS NOTE  Patient Care Team: Asencion Noble, MD as PCP - General (Internal Medicine) Satira Sark, MD as PCP - Cardiology (Cardiology) Ahmed Prima, Fransisco Hertz, PA-C as Physician Assistant (Physician Assistant) Heath Lark, MD as Consulting Physician (Hematology and Oncology) Collene Gobble, MD as Consulting Physician (Pulmonary Disease)  ASSESSMENT & PLAN:  Left ovarian epithelial cancer [facility]) She is still quite weak with poor performance status I recommend we continue supportive care only We will proceed with transfusion support I plan to see her again in 2 weeks for further follow-up  Anemia, chronic disease This is multifactorial, likely anemia chronic disease, chronic kidney disease as well as recent chemotherapy We discussed some of the risks, benefits, and alternatives of blood transfusions. The patient is symptomatic from anemia and the hemoglobin level is critically low.  Some of the side-effects to be expected including risks of transfusion reactions, chills, infection, syndrome of volume overload and risk of hospitalization from various reasons and the patient is willing to proceed and went ahead to sign consent today. I will proceed with 2 units of blood today  Cancer associated pain She continues to have poorly controlled pain, exacerbated by recent shingles She will continue oxycodone as prescribed Her primary care doctor has recently started her on gabapentin which she tolerated poorly due to excessive sedation She will continue her pain medication as prescribed  COPD (chronic obstructive pulmonary disease) ([place]) She has mild COPD exacerbation with scattered wheezes and shortness of breath, likely worsened by severe anemia She will continue medical management  Goals of care, counseling/discussion The patient continues to express concern that her chemotherapy is on hold I tried to redirect her focus of attention to managing all her  other health issues Her estimated ECOG performance status score is 3 and we need her to improve further before we can resume chemotherapy The patient and her daughter both understand    Orders Placed This Encounter  Procedures  . Prepare RBC    Standing Status:   Standing    Number of Occurrences:   1    Order Specific Question:   # of Units    Answer:   2 units    Order Specific Question:   Transfusion Indications    Answer:   Symptomatic Anemia    Order Specific Question:   Special Requirements    Answer:   Irradiated    Order Specific Question:   If emergent release call blood bank    Answer:   Not emergent release    INTERVAL HISTORY: Please see below for problem oriented charting. She returns for chemotherapy and follow-up She is accompanied by her daughter today Her primary care doctor has recently added gabapentin due to severe, persistent postherpetic neuralgia She is not able to tolerate higher doses of treatment due to excessive sedation She complained of weakness and fatigue The patient denies any recent signs or symptoms of bleeding such as spontaneous epistaxis, hematuria or hematochezia. Denies nausea or constipation She has occasional cough but no fever or chills  SUMMARY OF ONCOLOGIC HISTORY: Oncology History Overview Note  High grade serous ER 90%, PR 0% BRCA 1: no loss of expression MMR normal  R1 resection, platinum refractory, progressed on Femara, carboplatin and Gemzar   Left ovarian epithelial cancer ([place])  02/18/2016 Tumor Marker   Patient's tumor was tested for the following markers: CA125 Results of the tumor marker test revealed 45   05/22/2016 Tumor Marker   Patient's tumor  was tested for the following markers: CA125 Results of the tumor marker test revealed 53   05/22/2016 Imaging   Outside pelvic US showed 4.1 cm adnexa mass   06/24/2017 Imaging   Ct abdomen and pelvis:  1. Interim finding of moderate ascites within the abdomen and  pelvis with additional finding of diffuse nodular infiltration of the omentum and anterior mesenteric fat, the appearance would be consistent with peritoneal carcinomatosis/metastatic disease. Increasing retroperitoneal and upper abdominal adenopathy. 2. Re- demonstrated 3.8 cm cyst in the right adnexa. Enlarging soft tissue density in the left adnexa now with possible cystic component posteriorly. In light of the above findings, concern is for ovarian neoplasm. Correlation with pelvic ultrasound recommended. 3. Small right-sided pleural effusion, new since prior study 4. Stable hypodense splenic lesions since 2017.    06/25/2017 Imaging   US pelvis: 2.9 cm simple appearing cyst in the right ovary. Left ovary grossly unremarkable. Large volume ascites in the pelvis   06/30/2017 - 07/01/2017 Hospital Admission   She was admitted for evaluation of abdominal pain and ascites   07/01/2017 Pathology Results   PERITONEAL/ASCITIC FLUID(SPECIMEN 1 OF 1 COLLECTED 07/01/17): - POORLY DIFFERENTIATED CARCINOMA; SEE COMMENT Source Peritoneal/Ascitic Fluid, (specimen 1 of 1 collected 07/01/17) Gross Specimen: Received is/are 1000 cc's of brownish fluid. (BS:bs) Prepared: # Smears: 0 # Concentration Technique Slides (i.e. ThinPrep): 1 # Cell Block: 1 Additional Studies: Also received Hematology slide - [id number]. Comment The tumor cells are positive for cytokeratin 7 and Pax-8 but negative for cytokeratin 20, CDX-2, GATA-3, Napsin-A and TTF-1. Based on the immunoprofile a gynecology primary is favored   07/01/2017 Procedure   Successful ultrasound-guided diagnostic and therapeutic paracentesis yielding 2.5 liters of peritoneal fluid   07/07/2017 - 07/09/2017 Hospital Admission   She was admitted for management of malignant ascites   07/08/2017 Procedure   Successful ultrasound-guided therapeutic paracentesis yielding 2.7 liters liters of peritoneal fluid   07/12/2017 Procedure   Successful ultrasound-guided  paracentesis yielding 1450 mL of peritoneal fluid   07/18/2017 - 07/24/2017 Hospital Admission   She was admitted for expedited treatment   07/18/2017 Tumor Marker   Patient's tumor was tested for the following markers: CA125 Results of the tumor marker test revealed 1941   07/19/2017 - 02/04/2018 Chemotherapy   The patient had 6 cycles of carboplatin & Taxol for chemotherapy treatment, followed by 3 more cycles of carboplatin only    07/19/2017 - 02/04/2018 Chemotherapy   The patient had carboplatin and taxol   08/06/2017 Procedure   Successful ultrasound-guided therapeutic paracentesis yielding 2.6 liters of peritoneal fluid.   08/09/2017 Tumor Marker   Patient's tumor was tested for the following markers: CA125 Results of the tumor marker test revealed 1665   08/15/2017 Tumor Marker   Patient's tumor was tested for the following markers: CA125 Results of the tumor marker test revealed 937.9   08/20/2017 Imaging   ECHO: Normal LV size with EF 60-65%. Normal RV size and systolic function. No significant valvular abnormalities.   09/18/2017 Imaging   Chest Impression:  1. No evidence thoracic metastasis. 2. Interval increase and RIGHT pleural effusion.  Abdomen / Pelvis Impression:  1. Interval decrease in intraperitoneal free fluid. 2. Interval decrease in omental nodularity in the LEFT ventral peritoneal space. 3. Interval decrease in nodularity associated with the LEFT ovary. 4. Cystic portion of the RIGHT ovary is increased mildly in size.   09/20/2017 Tumor Marker   Patient's tumor was tested for the following markers: CA125 Results of the  tumor marker test revealed 347   10/14/2017 Tumor Marker   Patient's tumor was tested for the following markers: CA125 Results of the tumor marker test revealed 307.4   11/04/2017 Tumor Marker   Patient's tumor was tested for the following markers: CA125 Results of the tumor marker test revealed 262.5   11/28/2017 Imaging   1.  Interval decrease in right pleural effusion with resolution of right atelectasis seen previously. 2. New small left pleural effusion, symmetric to the right. 3. No intraperitoneal free fluid on the current study. 4. Continued further decrease in left omental disease, appearing less confluent today than on the prior study. 5. Left ovary remains normal in appearance today and the right adnexal cystic lesion is decreased in size compared to prior study. 6. 14 mm pancreatic cyst is unchanged. Continued attention on follow-up imaging recommended. 7. Aortic Atherosclerois (ICD10-170.0)   12/13/2017 Tumor Marker   Patient's tumor was tested for the following markers: CA125 Results of the tumor marker test revealed 197.7   01/03/2018 Tumor Marker   Patient's tumor was tested for the following markers: CA125 Results of the tumor marker test revealed 183.1   01/14/2018 Tumor Marker   Patient's tumor was tested for the following markers: CA125 Results of the tumor marker test revealed 177.4   02/04/2018 Tumor Marker   Patient's tumor was tested for the following markers: CA125 Results of the tumor marker test revealed 168.5   02/25/2018 Imaging   1. Omental carcinomatosis appears qualitatively stable to slightly decreased. Stable mild peritoneal thickening in the paracolic gutters. Stable right adnexal cyst. No ascites. No new or progressive metastatic disease in the abdomen or pelvis. 2. Small dependent right pleural effusion is increased. 3. Cystic pancreatic body lesion is decreased and now subcentimeter in size, suggesting a benign lesion. 4. Aortic Atherosclerosis (ICD10-I70.0).   03/03/2018 - 03/07/2018 Hospital Admission   She was hospitalized for GI bleed requiring blood transfusions. Xarelto was placed on hold   03/07/2018 PET scan   1. Persistent hazy omental interstitial nodularity but no hypermetabolism or discrete measurable nodules. No abdominal ascites. 2. No findings for metastatic  disease involving the chest. 3. Moderate-sized right pleural effusion and small left pleural effusion.    03/20/2018 Pathology Results   1. Ovary and fallopian tube, right - OVARY AND FALLOPIAN TUBE INVOLVED BY SEROUS CARCINOMA. - PARATUBAL CYST. 2. Uterus +/- tubes/ovaries, neoplastic, cervix, left ovary and fallopian tube - LEFT OVARY: HIGH GRADE SEROUS CARCINOMA WITH TREATMENT EFFECT, SPANNING 2.5 CM. CARCINOMA INVOLVES OVARIAN SURFACE. SEE ONCOLOGY TABLE. - LEFT FALLOPIAN TUBE: INVOLVED BY SEROUS CARCINOMA. - UTERUS: -ENDOMETRIUM: INACTIVE ENDOMETRIUM. NO HYPERPLASIA OR MALIGNANCY. -MYOMETRIUM: UNREMARKABLE. NO MALIGNANCY. -SEROSA: INVOLVED BY SEROUS CARCINOMA. - CERVIX: ENDOCERVICAL POLYP. NO MALIGNANCY. 3. Omentum, resection for tumor - INVOLVED BY SEROUS CARCINOMA. 4. Soft tissue, biopsy, mesenteric nodule - INVOLVED BY SEROUS CARCINOMA. Microscopic Comment 2. OVARY or FALLOPIAN TUBE or PRIMARY PERITONEUM: Procedure: Total hysterectomy and bilateral salpingo-oophorectomy. Omentectomy. Mesenteric lymph node biopsy. Specimen Integrity: Intact. Tumor Site: Left ovary. Ovarian Surface Involvement (required only if applicable): Present. Fallopian Tube Surface Involvement (required only if applicable): Present, bilateral. Tumor Size: 2.5 cm. Histologic Type: High grade serous carcinoma. Histologic Grade: High grade. Implants (required for advanced stage serous/seromucinous borderline tumors only): N/A. Other Tissue/ Organ Involvement: Bilateral fallopian tubes, right ovary, uterine serosa, omentum. Largest Extrapelvic Peritoneal Focus (required only if applicable): Microscopic, estimated 0.5 cm (omentum). Peritoneal/Ascitic Fluid: Prior Positive ([id number]). Treatment Effect (required only for high-grade serous carcinomas): Present in left  ovary. CRS2. Regional Lymph Nodes: No lymph nodes submitted/identified. Pathologic Stage Classification (pTNM, AJCC 8th Edition): ypT3b,  ypNX Representative Tumor Block: 1A, 1B, 28F, 21F. Comment(s): The right ovary has only surface deposits with a large paratubal cyst. The left ovary has intraparenchymal tumor with associated treatment effect. Thus the tumor location is classified as a left ovarian primary.   03/20/2018 Surgery   Procedure(s) Performed:  1. Exploratory laparotomy with total hysterectomy and bilateral salpingo-oophorectomy 2. Infragastic Omentectomy  3. Debulking to <1cm gross residual disease   Surgeon: Mart Piggs, MD  Specimens: Uterus Cervix, Bilateral tubes / ovaries and omentum. Mesenteric nodule.  Operative Findings: Debulked to gross residual disease <1cm; however there is miliary disease in multiple locations including the majority of the abdominal peritoneum (anterior abdominal wall, bilateral gutters), diaphragm (Right>left), majority of small bowel mesentary. Normal appendix. Normal small uterus. Right ovary with a cystic lesion ~3cm, some adhesive disease of right adnexa to rectum/sigmoid. Gross omental disease, which was resected with the omentectomy. Smooth liver surface, but again, diaphragmatic disease noted.      03/20/2018 Genetic Testing   Patient has genetic testing done for ER/PR. Results revealed patient has ER: 90%, PR 0%.    03/31/2018 Tumor Marker   Patient's tumor was tested for the following markers: CA125 Results of the tumor marker test revealed 215.8    Genetic Testing   Patient has genetic testing done for BRCA 1. Results revealed patient has the following: BRCA 1: no loss of expression.    Genetic Testing   Patient has genetic testing done for MMR . Results revealed patient has the following:  MMR: normal   03/31/2018 Genetic Testing   Patient has genetic testing done for BRCA1/2. Results revealed patient has no actionable mutations. She is found to have [place] genetic change of unknown significance   04/21/2018 Imaging   1. No definite findings of  residual or recurrent metastatic disease in the abdomen or pelvis status post interval TAHBSO and omentectomy. Stable minimal thickening in the paracolic gutters without discrete peritoneal nodularity. 2. Trace free fluid in the pelvic cul-de-sac. 3. Stable small dependent right pleural effusion. 4. Subcentimeter pancreatic body cystic lesion is stable to slightly decreased. 5. Aortic Atherosclerosis (ICD10-I70.0).   04/21/2018 Tumor Marker   Patient's tumor was tested for the following markers: CA125 Results of the tumor marker test revealed 187.3   04/23/2018 - 09/12/2018 Anti-estrogen oral therapy   She is placed on Femara   05/08/2018 Procedure   Successful ultrasound guided right thoracentesis yielding 800 mL of pleural fluid. Fluid cytology is negative for malignancy    05/28/2018 Tumor Marker   Patient's tumor was tested for the following markers: CA125 Results of the tumor marker test revealed 190   06/30/2018 Tumor Marker   Patient's tumor was tested for the following markers: CA125 Results of the tumor marker test revealed 148   07/23/2018 Imaging   Status post hysterectomy and bilateral salpingo-oophorectomy.  Very mild peritoneal thickening/nodularity, equivocal but worrisome for very mild peritoneal disease. Attention on follow-up is suggested.  Small right pleural effusion with indwelling pleural drain.   07/23/2018 Tumor Marker   Patient's tumor was tested for the following markers: CA125 Results of the tumor marker test revealed 215   09/11/2018 Tumor Marker   Patient's tumor was tested for the following markers: CA-125. Results of the tumor marker test revealed 1323   09/12/2018 Imaging   1. Mildly progressive ascites with some mild degree of nodularity most appreciable  along the left adnexa, right paracolic gutter, right upper quadrant, compatible with peritoneal spread of tumor. 2. Small right pleural effusion with indwelling right pleural catheter again noted.  Mild increase in atelectasis anteriorly at the right lung base. 3. Aortic Atherosclerosis (ICD10-I70.0). Coronary atherosclerosis. 4. Several tiny hypodense lesions in the spleen are technically nonspecific, but stable. 5. Air fluid level in the rectum compatible with diarrheal process. 6. Prominent bilateral hip arthropathy. Left hip screw noted.   09/22/2018 - 02/02/2019 Chemotherapy   The patient had carboplatin and gemzar   09/29/2018 Tumor Marker   Patient's tumor was tested for the following markers: CA125 Results of the tumor marker test revealed 1219   09/29/2018 Adverse Reaction   Cycle 1 day 8 of Gemzar was omitted due to severe anemia   10/20/2018 Tumor Marker   Patient's tumor was tested for the following markers: CA125 Results of the tumor marker test revealed 402   11/13/2018 Tumor Marker   Patient's tumor was tested for the following markers: CA125 Results of the tumor marker test revealed 437   11/26/2018 Imaging   1. Mild residual peritoneal thickening, overall decreased from 09/11/2018, with resolution of previously seen ascites. 2. Tiny residual right pleural effusion with small bore chest tube in place. 3. Supraumbilical midline ventral hernia has a wide neck and contains unobstructed colon. 4.  Aortic atherosclerosis (ICD10-170.0).    01/05/2019 Tumor Marker   Patient's tumor was tested for the following markers: CA125 Results of the tumor marker test revealed 929   02/02/2019 Tumor Marker   Patient's tumor was tested for the following markers: CA125 Results of the tumor marker test revealed 1198   02/09/2019 Imaging   1. New mild ascites. Mild peritoneal thickening shows no significant change, but remains consistent with peritoneal carcinomatosis. 2. Increased tiny bilateral pleural effusions. 3. Partial small bowel obstruction with transition point in the anterior lower abdomen, just deep to the anterior abdominal wall. 4. Stable small ventral abdominal  wall hernia containing transverse colon.   02/17/2019 -  Chemotherapy   The patient had PACLitaxel for chemotherapy treatment.    03/05/2019 Tumor Marker   Patient's tumor was tested for the following markers: CA125 Results of the tumor marker test revealed 1243   03/11/2019 Imaging   CXR Small bilateral subpulmonic effusions with basilar atelectasis. These are probably slightly larger than were seen in March.   03/30/2019 Tumor Marker   Patient's tumor was tested for the following markers: CA-125 Results of the tumor marker test revealed 776   05/01/2019 Tumor Marker   Patient's tumor was tested for the following markers: CA-125 Results of the tumor marker test revealed 180   05/19/2019 Tumor Marker   Patient's tumor was tested for the following markers: CA-125 Results of the tumor marker test revealed 163   05/29/2019 Imaging   1. Interval resolution of small volume ascites noted within the pelvis. Continued mild peritoneal thickening consistent with peritoneal carcinomatosis. 2. No new or progressive findings identified within the chest, abdomen or pelvis. 3. Partial small bowel obstruction described on 02/09/2019 is similar to previous exam. 4. Unchanged appearance of small pleural effusions, right greater than left. 5. Cystic lesion within neck of pancreas is identified, indeterminate measuring 1.7 cm. Increased from 1.3 cm previously. According to consensus criteria follow-up imaging in 6 months with repeat contrast enhanced pancreas protocol MRI or CT is advised. This recommendation follows ACR consensus guidelines: Management of Incidental Pancreatic Cysts: A White Paper of the ACR Incidental Findings Committee.  J Am Coll Radiol 2542;70:623-762. 6. Tiny small nonspecific nodules within the right upper lobe are unchanged from previous exam.     06/12/2019 Tumor Marker   Patient's tumor was tested for the following markers: CA-125 Results of the tumor marker test revealed 148    07/09/2019 - 07/12/2019 Hospital Admission   She had recurrent admissions to the hospital for pneumonia   07/21/2019 Tumor Marker   Patient's tumor was tested for the following markers: CA-125 Results of the tumor marker test revealed 442.     REVIEW OF SYSTEMS:   Constitutional: Denies fevers, chills or abnormal weight loss Eyes: Denies blurriness of vision Ears, nose, mouth, throat, and face: Denies mucositis or sore throat Cardiovascular: Denies palpitation, chest discomfort or lower extremity swelling Gastrointestinal:  Denies nausea, heartburn or change in bowel habits Skin: Denies abnormal skin rashes Lymphatics: Denies new lymphadenopathy or easy bruising Behavioral/Psych: Mood is stable, no new changes  All other systems were reviewed with the patient and are negative.  I have reviewed the past medical history, past surgical history, social history and family history with the patient and they are unchanged from previous note.  ALLERGIES:  is allergic to codeine.  MEDICATIONS:  Current Outpatient Medications  Medication Sig Dispense Refill  . gabapentin (NEURONTIN) 100 MG capsule Take 100 mg by mouth. Take as directed by her doctor. 100 mg morning and 300 mg at night    . albuterol (PROAIR HFA) 108 (90 Base) MCG/ACT inhaler Inhale 2 puffs into the lungs every 6 (six) hours as needed for wheezing or shortness of breath. 18 g 5  . ALPRAZolam (XANAX) 0.25 MG tablet Take 1 tablet (0.25 mg total) by mouth at bedtime as needed for anxiety. 5 tablet 0  . azelastine (ASTELIN) 0.1 % nasal spray Place 2 sprays into both nostrils 2 (two) times daily as needed for allergies.     . Cholecalciferol (VITAMIN D) 2000 units CAPS Take 2,000 Units by mouth daily.     Marland Kitchen dicyclomine (BENTYL) 10 MG capsule TAKE 1 CAPSULE(10 MG) BY MOUTH TWICE DAILY (Patient taking differently: Take 10 mg by mouth 2 (two) times daily. ) 60 capsule 1  . diltiazem (CARDIZEM CD) 180 MG 24 hr capsule Take 1 capsule (180  mg total) by mouth 2 (two) times a day. 180 capsule 3  . diphenhydrAMINE (BENADRYL) 25 mg capsule Take 25 mg by mouth every 8 (eight) hours as needed for itching.     . DULoxetine (CYMBALTA) 60 MG capsule Take 1 capsule (60 mg total) by mouth at bedtime. 30 capsule 3  . fluticasone (FLONASE) 50 MCG/ACT nasal spray Place 2 sprays into both nostrils 2 (two) times daily as needed (FOR NASAL CONGESTION.).     Marland Kitchen furosemide (LASIX) 40 MG tablet Take 1 tablet (40 mg total) by mouth daily. 90 tablet 3  . gabapentin (NEURONTIN) 300 MG capsule Take 1 capsule (300 mg total) by mouth at bedtime. (Patient taking differently: Take 300 mg by mouth 2 (two) times daily. ) 30 capsule 0  . halobetasol (ULTRAVATE) 0.05 % cream Apply 1 application topically 2 (two) times daily as needed (psoriasis).     . hydrocortisone valerate cream (WESTCORT) 0.2 % Apply 1 application topically 2 (two) times daily. 45 g 0  . levothyroxine (SYNTHROID, LEVOTHROID) 137 MCG tablet Take 137 mcg by mouth daily before breakfast. For thyroid therapy    . Magnesium 400 MG CAPS Take 400 mg by mouth daily.     . mirtazapine (REMERON)  15 MG tablet Take 1 tablet (15 mg total) by mouth at bedtime. 30 tablet 9  . Oxycodone HCl 10 MG TABS Take 1 tablet (10 mg total) by mouth every 4 (four) hours as needed. 90 tablet 0  . polyvinyl alcohol (LIQUIFILM TEARS) 1.4 % ophthalmic solution Place 1 drop into both eyes 2 (two) times daily as needed for dry eyes.     . potassium chloride SA (K-DUR,KLOR-CON) 20 MEQ tablet TAKE 1 TABLET BY MOUTH DAILY AND TAKE 1 ADDITIONAL TABLET WHEN TAKING EXTRA LASIX DOSE (Patient taking differently: Take 20 mEq by mouth daily. ) 60 tablet 6  . Rivaroxaban (XARELTO) 15 MG TABS tablet Take 15 mg by mouth daily.    . Tiotropium Bromide-Olodaterol (STIOLTO RESPIMAT) 2.5-2.5 MCG/ACT AERS Inhale 2 puffs into the lungs daily. 4 g 11   No current facility-administered medications for this visit.    Facility-Administered  Medications Ordered in Other Visits  Medication Dose Route Frequency Provider Last Rate Last Dose  . 0.9 %  sodium chloride infusion (Manually program via Guardrails IV Fluids)  250 mL Intravenous Once Heath Lark, MD   Stopped at 12/16/18 1500  . heparin lock flush 100 unit/mL  250 Units Intracatheter PRN Nicholis Stepanek, MD      . sodium chloride flush (NS) 0.9 % injection 3 mL  3 mL Intracatheter PRN Alvy Bimler, Deoni Cosey, MD        PHYSICAL EXAMINATION: ECOG PERFORMANCE STATUS: 3 - Symptomatic, >50% confined to bed  Vitals:   08/14/19 1127  BP: 118/65  Pulse: 85  Resp: 18  Temp: 98.3 F (36.8 C)  SpO2: 90%   Filed Weights   08/14/19 1127  Weight: 168 lb 1.6 oz (76.2 kg)    GENERAL:alert, no distress and comfortable.  She looks weak and pale, sitting on a wheelchair SKIN: skin color, texture, turgor are normal, no rashes or significant lesions EYES: normal, Conjunctiva are pale and non-injected, sclera clear OROPHARYNX:no exudate, no erythema and lips, buccal mucosa, and tongue normal  NECK: supple, thyroid normal size, non-tender, without nodularity LYMPH:  no palpable lymphadenopathy in the cervical, axillary or inguinal LUNGS: Bilateral wheezes with increased breathing effort HEART: regular rate & rhythm and no murmurs and no lower extremity edema ABDOMEN:abdomen soft, non-tender and normal bowel sounds Musculoskeletal:no cyanosis of digits and no clubbing  NEURO: alert & oriented x 3 with fluent speech, no focal motor/sensory deficits  LABORATORY DATA:  I have reviewed the data as listed    Component Value Date/Time   NA 135 08/14/2019 1100   NA 135 (L) 09/24/2017 0945   K 3.4 (L) 08/14/2019 1100   K 3.7 09/24/2017 0945   CL 90 (L) 08/14/2019 1100   CO2 34 (H) 08/14/2019 1100   CO2 30 (H) 09/24/2017 0945   GLUCOSE 112 (H) 08/14/2019 1100   GLUCOSE 101 09/24/2017 0945   BUN 16 08/14/2019 1100   BUN 15.3 09/24/2017 0945   CREATININE 1.38 (H) 08/14/2019 1100   CREATININE  1.11 (H) 06/24/2019 0848   CREATININE 0.8 09/24/2017 0945   CALCIUM 8.6 (L) 08/14/2019 1100   CALCIUM 9.1 09/24/2017 0945   PROT 6.8 08/14/2019 1100   PROT 6.6 09/24/2017 0945   ALBUMIN 3.0 (L) 08/14/2019 1100   ALBUMIN 3.3 (L) 09/24/2017 0945   AST 14 (L) 08/14/2019 1100   AST 18 06/24/2019 0848   AST 16 09/24/2017 0945   ALT 8 08/14/2019 1100   ALT 9 06/24/2019 0848   ALT 12 09/24/2017 0945  ALKPHOS 65 08/14/2019 1100   ALKPHOS 52 09/24/2017 0945   BILITOT 0.4 08/14/2019 1100   BILITOT 0.4 06/24/2019 0848   BILITOT 1.13 09/24/2017 0945   GFRNONAA 35 (L) 08/14/2019 1100   GFRNONAA 46 (L) 06/24/2019 0848   GFRAA 41 (L) 08/14/2019 1100   GFRAA 53 (L) 06/24/2019 0848    No results found for: SPEP, UPEP  Lab Results  Component Value Date   WBC 13.4 (H) 08/14/2019   NEUTROABS 10.3 (H) 08/14/2019   HGB 6.9 (LL) 08/14/2019   HCT 24.5 (L) 08/14/2019   MCV 76.6 (L) 08/14/2019   PLT 272 08/14/2019      Chemistry      Component Value Date/Time   NA 135 08/14/2019 1100   NA 135 (L) 09/24/2017 0945   K 3.4 (L) 08/14/2019 1100   K 3.7 09/24/2017 0945   CL 90 (L) 08/14/2019 1100   CO2 34 (H) 08/14/2019 1100   CO2 30 (H) 09/24/2017 0945   BUN 16 08/14/2019 1100   BUN 15.3 09/24/2017 0945   CREATININE 1.38 (H) 08/14/2019 1100   CREATININE 1.11 (H) 06/24/2019 0848   CREATININE 0.8 09/24/2017 0945      Component Value Date/Time   CALCIUM 8.6 (L) 08/14/2019 1100   CALCIUM 9.1 09/24/2017 0945   ALKPHOS 65 08/14/2019 1100   ALKPHOS 52 09/24/2017 0945   AST 14 (L) 08/14/2019 1100   AST 18 06/24/2019 0848   AST 16 09/24/2017 0945   ALT 8 08/14/2019 1100   ALT 9 06/24/2019 0848   ALT 12 09/24/2017 0945   BILITOT 0.4 08/14/2019 1100   BILITOT 0.4 06/24/2019 0848   BILITOT 1.13 09/24/2017 0945       RADIOGRAPHIC STUDIES: I have personally reviewed the radiological images as listed and agreed with the findings in the report. Dg Chest 1 View  Result Date:  07/20/2019 CLINICAL DATA:  Pleural effusion; post thoracentesis. Hx of paroxysmal afib, diastolic CHF, COPD. Former [phone]).pleural effusion EXAM: CHEST  1 VIEW COMPARISON:  Chest radiograph 07/09/2019 FINDINGS: Port in the anterior chest wall with tip in distal SVC. Interval decrease in RIGHT pleural effusion following thoracentesis. No pneumothorax. Small LEFT effusion remains. IMPRESSION: 1. No pneumothorax following RIGHT thoracentesis. 2. Reduction in RIGHT pleural effusion. Electronically Signed   By: Suzy Bouchard M.D.   On: 07/20/2019 11:07   Dg Chest 2 View  Result Date: 07/30/2019 CLINICAL DATA:  Pleural effusion, cough. EXAM: CHEST - 2 VIEW COMPARISON:  July 20, 2019. FINDINGS: The heart size and mediastinal contours are within normal limits. Right internal jugular Port-A-Cath is unchanged in position. No pneumothorax is noted. Mild bibasilar subsegmental atelectasis is noted with small pleural effusions. The visualized skeletal structures are unremarkable. IMPRESSION: Mild bibasilar subsegmental atelectasis is noted with small pleural effusions. Aortic Atherosclerosis (ICD10-I70.0). Electronically Signed   By: Marijo Conception M.D.   On: 07/30/2019 11:01   Ir Thoracentesis Asp Pleural Space W/img Guide  Result Date: 07/20/2019 INDICATION: Patient with history of ovarian cancer, COPD on home O2, HF, and recurrent right-sided pleural effusion. Request is made for diagnostic and therapeutic right thoracentesis. EXAM: ULTRASOUND GUIDED DIAGNOSTIC AND THERAPEUTIC RIGHT THORACENTESIS MEDICATIONS: 10 mL 1% lidocaine COMPLICATIONS: None immediate. PROCEDURE: An ultrasound guided thoracentesis was thoroughly discussed with the patient and questions answered. The benefits, risks, alternatives and complications were also discussed. The patient understands and wishes to proceed with the procedure. Written consent was obtained. Ultrasound was performed to localize and mark an adequate pocket of  fluid in the right chest. The area was then prepped and draped in the normal sterile fashion. 1% Lidocaine was used for local anesthesia. Under ultrasound guidance a 6 Fr Safe-T-Centesis catheter was introduced. Thoracentesis was performed. The catheter was removed and a dressing applied. FINDINGS: A total of approximately 600 mL of clear yellow fluid was removed. Samples were sent to the laboratory as requested by the clinical team. IMPRESSION: Successful ultrasound guided right thoracentesis yielding 600 mL of pleural fluid. Read by: Earley Abide, PA-C Electronically Signed   By: Markus Daft M.D.   On: 07/20/2019 11:16    All questions were answered. The patient knows to call the clinic with any problems, questions or concerns. No barriers to learning was detected.  I spent 25 minutes counseling the patient face to face. The total time spent in the appointment was 30 minutes and more than 50% was on counseling and review of test results  Heath Lark, MD 08/14/2019 12:57 PM

## 2019-08-15 LAB — TYPE AND SCREEN
ABO/RH(D): O POS
Antibody Screen: NEGATIVE
Unit division: 0
Unit division: 0

## 2019-08-15 LAB — BPAM RBC
Blood Product Expiration Date: 202011272359
Blood Product Expiration Date: 202012042359
ISSUE DATE / TIME: 202011061238
ISSUE DATE / TIME: 202011061238
Unit Type and Rh: 5100
Unit Type and Rh: 5100

## 2019-08-15 LAB — CA 125: Cancer Antigen (CA) 125: 910 U/mL — ABNORMAL HIGH (ref 0.0–38.1)

## 2019-08-19 LAB — FUNGUS CULTURE WITH STAIN

## 2019-08-19 LAB — FUNGUS CULTURE RESULT

## 2019-08-19 LAB — FUNGAL ORGANISM REFLEX

## 2019-08-21 ENCOUNTER — Ambulatory Visit (INDEPENDENT_AMBULATORY_CARE_PROVIDER_SITE_OTHER): Payer: Medicare Other | Admitting: Primary Care

## 2019-08-21 ENCOUNTER — Encounter: Payer: Self-pay | Admitting: Primary Care

## 2019-08-21 ENCOUNTER — Other Ambulatory Visit: Payer: Self-pay

## 2019-08-21 VITALS — BP 132/68 | HR 83 | Temp 97.6°F | Ht 64.0 in | Wt 167.2 lb

## 2019-08-21 DIAGNOSIS — D638 Anemia in other chronic diseases classified elsewhere: Secondary | ICD-10-CM | POA: Diagnosis not present

## 2019-08-21 DIAGNOSIS — B0229 Other postherpetic nervous system involvement: Secondary | ICD-10-CM | POA: Diagnosis not present

## 2019-08-21 MED ORDER — LIDOCAINE 5 % EX PTCH: 1.0000 | MEDICATED_PATCH | CUTANEOUS | 0 refills | Status: DC

## 2019-08-21 MED ORDER — ARNUITY ELLIPTA 100 MCG/ACT IN AEPB: 1.0000 | INHALATION_SPRAY | Freq: Every day | RESPIRATORY_TRACT | 0 refills | Status: DC

## 2019-08-21 NOTE — Progress Notes (Signed)
@Patient  ID: [name], female    DOB: 1932/12/01, 85 y.o.   MRN: [id number]  Chief Complaint  Patient presents with  . Bronchitis with bronchospasm    Referring provider: Asencion Noble, MD  HPI: 85 year old female, former smoker. PMH significant for COPD, chronic rhinitis, chronic cough, afib (on xarelto), ovarian cancer undergoing chemotherapy, right pleural effusion-pleurex cath removed May 2020 (cytology neg for malignant cells). Followed by thoracic surgery. Patient of Dr. Lamonte Sakai. Ongoing dyspnea felt to be multifactorial. Maintained on Stiolto and uses 2L oxygen. CT in July showed moderate right pleural effusion and small left pleural effusion. Emphysematous changes. Monitor through serial scans/xrays. Holding off on further thoracentesis or Pleurx for now.  Previous [facility] pulmonary encounter: 07/08/2019 Patient contacted today for virtual video visit. She is accompanied by her aid and daughter on phone. Reports increased chest congestion and productive cough x 3-4 days. States that she is getting mucus up but there isn't much color to it. She had an episode of shortness of breath this morning before taking her Stiolto inhaler. She is fine when sitting at rest. Recently treated with prednisone taper for shingles. Reports burning to bilateral lungs. Denies chest tightness or wheezing.   07/13/19- Video visit, Dr. Lamonte Sakai Admitted early October for right pneumonia and chronic bilateral pleural effusion. Treated with azithromycin, cefdinir and continued on prednisone. Remains on stiolto,, albuterol for COPD regimen. She is on oxygen 2-3L. May be slightly improved since hospitalization but remains dyspneic and clearly worse than her baseline. Referred to IR for right thoracentesis with cytology, culture and labs performed on fluid.   07/29/2019 Patient contacted today for televisit. She had right thoracentesis on 10/12 with interventional radiology. Removed 629m clear yellow fluid. CXR post  procedure showed no pneumothorax following thoracentesis, reduction in right pleural effusion. Reports increased productive cough with yellow mucus and wheezing x7 days. Temp 99.2. Taking stiolto daily as prescribed, using albuterol once a day. Her shortness of breath improved after thoracentesis for a few days. Denies chest pain/tightness or hemoptysis. Covid negative.  08/21/2019  Patient presents today for 1-2 week follow-up. Accompanied by her daughter. Treated for acute bronchitis with bronchospasm in October with prednisone taper. She completed Azithromycin and Cefdinir for suspected CAP. She is feeling well today, breathing is baseline but still experiences shortness of breath daily. Her cough has resolved. She has no acute respiratory symptoms. She was diagnosed with Shingles back in August, she has residual postherpetic neuralgia. Neurontin was change to Lyrica which she started last night. She is in a good deal of pain due to this. Afebrile.   Allergies  Allergen Reactions  . Codeine Itching    Immunization History  Administered Date(s) Administered  . Fluad Quad(high Dose 65+) 07/21/2019  . Influenza Split 07/09/2011, 08/19/2013, 06/26/2014  . Influenza Whole 07/08/2009, 07/08/2010, 07/10/2012  . Influenza, High Dose Seasonal PF 07/09/2017  . Influenza,inj,Quad PF,6+ Mos 07/09/2015, 07/07/2016  . Influenza-Unspecified 07/08/2018  . Pneumococcal Conjugate-13 07/26/2014  . Pneumococcal Polysaccharide-23 07/08/2008    Past Medical History:  Diagnosis Date  . Anxiety   . Chronic blood loss anemia    03-04-2018 diverticular bleed and rectal bleeding,  transfused 2 units PRBCs 03-08-2018  . Colitis   . COPD (chronic obstructive pulmonary disease) (HCC)    Dr. BLamonte Sakai . Depression   . Diastolic CHF, chronic ([facility]   . Diverticulosis   . Family history of colon cancer   . Fibromyalgia   . Genetic testing 04/07/2018   MyRisk (  35 genes) @ [facility] - No pathogenic mutations detected   . GERD (gastroesophageal reflux disease)   . Hemorrhoids   . Hiatal hernia   . Hip pain 07/2018   Right Hip Pain  . History of rectal polyps   . History of shingles   . Hypothyroidism   . IBS (irritable bowel syndrome)   . Lymphocytic colitis    Dr. Henrene Pastor  . Malignant ascites    Admission 06/2017 abdominal s/p parencentesis 07-01-2017 2.5L, 07-08-2016  2.7L, 07-12-2017  1464m  . [place]   . On home O2   . Osteoporosis   . Ovarian cancer ([facility]    Chemotherapy - Dr. GAlvy Bimler . Paroxysmal atrial fibrillation ([facility] [place]    Xarelto stopped 03-07-2018 due to lower GI bleed  . Pleural effusion    s/p  right thoracentesis, 02-2018 1.3L and 03-17-2018 right thoracentesis 6445m, post cxr no residual effusion  . [place]atic [place] ([facility]  . Schatzki's ring 2013  . Seasonal allergic rhinitis     Tobacco History: Social History   Tobacco Use  Smoking Status Former Smoker  . Packs/day: 1.00  . Years: 20.00  . Pack years: 20.00  . Types: Cigarettes  . Quit date: 10/08/1980  . Years since quitting: 38.8  Smokeless Tobacco Never Used   Counseling given: Not Answered   Outpatient Medications Prior to Visit  Medication Sig Dispense Refill  . albuterol (PROAIR HFA) 108 (90 Base) MCG/ACT inhaler Inhale 2 puffs into the lungs every 6 (six) hours as needed for wheezing or shortness of breath. 18 g 5  . ALPRAZolam (XANAX) 0.25 MG tablet Take 1 tablet (0.25 mg total) by mouth at bedtime as needed for anxiety. 5 tablet 0  . azelastine (ASTELIN) 0.1 % nasal spray Place 2 sprays into both nostrils 2 (two) times daily as needed for allergies.     . Cholecalciferol (VITAMIN D) 2000 units CAPS Take 2,000 Units by mouth daily.     . Marland Kitchenicyclomine (BENTYL) 10 MG capsule TAKE 1 CAPSULE(10 MG) BY MOUTH TWICE DAILY (Patient taking differently: Take 10 mg by mouth 2 (two) times daily. ) 60 capsule 1  . diltiazem (CARDIZEM CD) 180 MG 24 hr capsule Take 1 capsule (180 mg  total) by mouth 2 (two) times a day. 180 capsule 3  . diphenhydrAMINE (BENADRYL) 25 mg capsule Take 25 mg by mouth every 8 (eight) hours as needed for itching.     . DULoxetine (CYMBALTA) 60 MG capsule Take 1 capsule (60 mg total) by mouth at bedtime. 30 capsule 3  . fluticasone (FLONASE) 50 MCG/ACT nasal spray Place 2 sprays into both nostrils 2 (two) times daily as needed (FOR NASAL CONGESTION.).     . Marland Kitchenurosemide (LASIX) 40 MG tablet Take 1 tablet (40 mg total) by mouth daily. 90 tablet 3  . halobetasol (ULTRAVATE) 0.05 % cream Apply 1 application topically 2 (two) times daily as needed (psoriasis).     . hydrocortisone valerate cream (WESTCORT) 0.2 % Apply 1 application topically 2 (two) times daily. 45 g 0  . levothyroxine (SYNTHROID, LEVOTHROID) 137 MCG tablet Take 137 mcg by mouth daily before breakfast. For thyroid therapy    . Magnesium 400 MG CAPS Take 400 mg by mouth daily.     . mirtazapine (REMERON) 15 MG tablet Take 1 tablet (15 mg total) by mouth at bedtime. 30 tablet 9  . Oxycodone HCl 10 MG TABS Take 1 tablet (10 mg total) by mouth every  4 (four) hours as needed. 90 tablet 0  . polyvinyl alcohol (LIQUIFILM TEARS) 1.4 % ophthalmic solution Place 1 drop into both eyes 2 (two) times daily as needed for dry eyes.     . potassium chloride SA (K-DUR,KLOR-CON) 20 MEQ tablet TAKE 1 TABLET BY MOUTH DAILY AND TAKE 1 ADDITIONAL TABLET WHEN TAKING EXTRA LASIX DOSE (Patient taking differently: Take 20 mEq by mouth daily. ) 60 tablet 6  . prednisoLONE acetate (PRED FORTE) 1 % ophthalmic suspension PLACE 1 GTT INTO OU QID FOR 1 WEEK THEN BID FOR 1 WEEK    . pregabalin (LYRICA) 75 MG capsule Take 1 capsule by mouth. 1 capsule daily for 7 days, then BID    . Rivaroxaban (XARELTO) 15 MG TABS tablet Take 15 mg by mouth daily.    . Tiotropium Bromide-Olodaterol (STIOLTO RESPIMAT) 2.5-2.5 MCG/ACT AERS Inhale 2 puffs into the lungs daily. 4 g 11  . gabapentin (NEURONTIN) 100 MG capsule Take 100 mg by  mouth. Take as directed by her doctor. 100 mg morning and 300 mg at night    . gabapentin (NEURONTIN) 300 MG capsule Take 1 capsule (300 mg total) by mouth at bedtime. (Patient not taking: Reported on 08/21/2019) 30 capsule 0   Facility-Administered Medications Prior to Visit  Medication Dose Route Frequency Provider Last Rate Last Dose  . 0.9 %  sodium chloride infusion (Manually program via Guardrails IV Fluids)  250 mL Intravenous Once Heath Lark, MD   Stopped at 12/16/18 1500      Review of Systems  Review of Systems  Constitutional: Negative.   Respiratory: Negative.   Cardiovascular: Negative.   Neurological:       Right sided postherpetic neuralgia     Physical Exam  BP 132/68 (BP Location: Right Arm, Patient Position: Sitting, Cuff Size: Normal)   Pulse 83   Temp 97.6 F (36.4 C)   Ht 5' 4"  (1.626 m)   Wt 167 lb 3.2 oz (75.8 kg)   SpO2 96% Comment: 2L pulse  BMI 28.70 kg/m  Physical Exam Constitutional:      Appearance: Normal appearance. She is well-developed.  HENT:     Head: Normocephalic and atraumatic.     Mouth/Throat:     Comments: Deferred d/t masking Neck:     Musculoskeletal: Normal range of motion and neck supple.  Cardiovascular:     Rate and Rhythm: Normal rate and regular rhythm.     Heart sounds: Normal heart sounds. No murmur.  Pulmonary:     Effort: Pulmonary effort is normal. No respiratory distress.     Breath sounds: Normal breath sounds. No wheezing.  Skin:    General: Skin is warm and dry.     Findings: No erythema or rash.  Neurological:     General: No focal deficit present.     Mental Status: She is alert and oriented to person, place, and time. Mental status is at baseline.  Psychiatric:        Mood and Affect: Mood normal.        Behavior: Behavior normal.        Thought Content: Thought content normal.        Judgment: Judgment normal.      Lab Results:  CBC    Component Value Date/Time   WBC 13.4 (H) 08/14/2019  1100   RBC 3.20 (L) 08/14/2019 1100   HGB 6.9 (LL) 08/14/2019 1100   HGB 7.6 (L) 06/24/2019 0848   HGB 10.8 (L) 09/24/2017 0945  HCT 24.5 (L) 08/14/2019 1100   HCT 32.8 (L) 09/24/2017 0945   PLT 272 08/14/2019 1100   PLT 326 06/24/2019 0848   PLT 174 09/24/2017 0945   MCV 76.6 (L) 08/14/2019 1100   MCV 93.2 09/24/2017 0945   MCH 21.6 (L) 08/14/2019 1100   MCHC 28.2 (L) 08/14/2019 1100   RDW 18.9 (H) 08/14/2019 1100   RDW 18.7 (H) 09/24/2017 0945   LYMPHSABS 1.6 08/14/2019 1100   LYMPHSABS 0.9 09/24/2017 0945   MONOABS 1.2 (H) 08/14/2019 1100   MONOABS 0.1 09/24/2017 0945   EOSABS 0.1 08/14/2019 1100   EOSABS 0.1 09/24/2017 0945   BASOSABS 0.0 08/14/2019 1100   BASOSABS 0.0 09/24/2017 0945    BMET    Component Value Date/Time   NA 135 08/14/2019 1100   NA 135 (L) 09/24/2017 0945   K 3.4 (L) 08/14/2019 1100   K 3.7 09/24/2017 0945   CL 90 (L) 08/14/2019 1100   CO2 34 (H) 08/14/2019 1100   CO2 30 (H) 09/24/2017 0945   GLUCOSE 112 (H) 08/14/2019 1100   GLUCOSE 101 09/24/2017 0945   BUN 16 08/14/2019 1100   BUN 15.3 09/24/2017 0945   CREATININE 1.38 (H) 08/14/2019 1100   CREATININE 1.11 (H) 06/24/2019 0848   CREATININE 0.8 09/24/2017 0945   CALCIUM 8.6 (L) 08/14/2019 1100   CALCIUM 9.1 09/24/2017 0945   GFRNONAA 35 (L) 08/14/2019 1100   GFRNONAA 46 (L) 06/24/2019 0848   GFRAA 41 (L) 08/14/2019 1100   GFRAA 53 (L) 06/24/2019 0848    BNP    Component Value Date/Time   BNP 115.0 (H) 04/17/2019 0918   BNP 51.2 09/05/2015 1118    ProBNP    Component Value Date/Time   PROBNP 837 (H) 08/12/2017 1137    Imaging: Dg Chest 2 View  Result Date: 07/30/2019 CLINICAL DATA:  Pleural effusion, cough. EXAM: CHEST - 2 VIEW COMPARISON:  July 20, 2019. FINDINGS: The heart size and mediastinal contours are within normal limits. Right internal jugular Port-A-Cath is unchanged in position. No pneumothorax is noted. Mild bibasilar subsegmental atelectasis is noted with  small pleural effusions. The visualized skeletal structures are unremarkable. IMPRESSION: Mild bibasilar subsegmental atelectasis is noted with small pleural effusions. Aortic Atherosclerosis (ICD10-I70.0). Electronically Signed   By: Marijo Conception M.D.   On: 07/30/2019 11:01     Assessment & Plan:   COPD (chronic obstructive pulmonary disease) (HCC) - Breathing is baseline but still experiences significant shortness of breath daily - LS with scattered wheezing - Trial Arnuity- 1 puff once daily (consider changing to Trelegy- no samples today) - Continue Stiolto 2 puffs once daily; PRN Ventolin 2 puffs every 4-6 hours     Post herpetic neuralgia - Continue Lyrica 23m daily - Add lidocaine patch 5% daily    EMartyn Ehrich NP 08/21/2019

## 2019-08-21 NOTE — Assessment & Plan Note (Signed)
-   Continue Lyrica 75mg  daily - Add lidocaine patch 5% daily

## 2019-08-21 NOTE — Patient Instructions (Addendum)
Pleasure seeing you again today  Recommendations: Trial Arnuity- 1 puff once daily Continue Stiolto 2 puffs once daily  Ventolin 2 puffs every 4-6 hours for breakthrough shortness of breath/wheezing   Rx: Lidocaine patches for nerve pain  Orders: CBC re: chronic anemia (if very low may need to go to ED)  Follow-up: 4-6 weeks with Dr. Lamonte Sakai

## 2019-08-21 NOTE — Assessment & Plan Note (Signed)
-   Breathing is baseline but still experiences significant shortness of breath daily - LS with scattered wheezing - Trial Arnuity- 1 puff once daily (consider changing to Trelegy- no samples today) - Continue Stiolto 2 puffs once daily; PRN Ventolin 2 puffs every 4-6 hours

## 2019-08-22 LAB — CBC
HCT: 34 % — ABNORMAL LOW (ref 35.0–45.0)
Hemoglobin: 10.3 g/dL — ABNORMAL LOW (ref 11.7–15.5)
MCH: 23.4 pg — ABNORMAL LOW (ref 27.0–33.0)
MCHC: 30.3 g/dL — ABNORMAL LOW (ref 32.0–36.0)
MCV: 77.3 fL — ABNORMAL LOW (ref 80.0–100.0)
MPV: 10.1 fL (ref 7.5–12.5)
Platelets: 321 10*3/uL (ref 140–400)
RBC: 4.4 10*6/uL (ref 3.80–5.10)
RDW: 18.9 % — ABNORMAL HIGH (ref 11.0–15.0)
WBC: 11.7 10*3/uL — ABNORMAL HIGH (ref 3.8–10.8)

## 2019-08-24 NOTE — Progress Notes (Signed)
Please let [name] know her HGB is improved at 10.3. WBC is also down. Looks good

## 2019-08-24 NOTE — Progress Notes (Signed)
Called spoke with patient's daughter [name], advised of lab results / recs as stated by Derl Barrow NP.  Pt verbalized understanding and denied any questions.

## 2019-08-27 ENCOUNTER — Telehealth: Payer: Self-pay

## 2019-08-27 NOTE — Telephone Encounter (Signed)
Pt's daughter called.  Pt continues with c/o feeling tired/fatigued.  Pt's daughter denies any confusion, reports increased in appetite and handing fluids well.    Pt's daughter notified that patient has stopped the Gabapentin and Lyrica 3 days ago that was prescribed my PCP.   RN educated that feelings of being tired/fatigued could still be coming from medication as this can take some time to get out of system.   Pt has appointment scheduled for 11/24, encouraged to keep appointment.

## 2019-09-01 ENCOUNTER — Encounter: Payer: Self-pay | Admitting: Hematology and Oncology

## 2019-09-01 ENCOUNTER — Inpatient Hospital Stay: Payer: Medicare Other

## 2019-09-01 ENCOUNTER — Other Ambulatory Visit: Payer: Self-pay

## 2019-09-01 ENCOUNTER — Telehealth: Payer: Self-pay | Admitting: Hematology and Oncology

## 2019-09-01 ENCOUNTER — Inpatient Hospital Stay (HOSPITAL_BASED_OUTPATIENT_CLINIC_OR_DEPARTMENT_OTHER): Payer: Medicare Other | Admitting: Hematology and Oncology

## 2019-09-01 DIAGNOSIS — D638 Anemia in other chronic diseases classified elsewhere: Secondary | ICD-10-CM | POA: Diagnosis not present

## 2019-09-01 DIAGNOSIS — N183 Chronic kidney disease, stage 3 unspecified: Secondary | ICD-10-CM

## 2019-09-01 DIAGNOSIS — G893 Neoplasm related pain (acute) (chronic): Secondary | ICD-10-CM

## 2019-09-01 DIAGNOSIS — C562 Malignant neoplasm of left ovary: Secondary | ICD-10-CM

## 2019-09-01 LAB — COMPREHENSIVE METABOLIC PANEL
ALT: 11 U/L (ref 0–44)
AST: 20 U/L (ref 15–41)
Albumin: 2.8 g/dL — ABNORMAL LOW (ref 3.5–5.0)
Alkaline Phosphatase: 53 U/L (ref 38–126)
Anion gap: 10 (ref 5–15)
BUN: 19 mg/dL (ref 8–23)
CO2: 35 mmol/L — ABNORMAL HIGH (ref 22–32)
Calcium: 8.3 mg/dL — ABNORMAL LOW (ref 8.9–10.3)
Chloride: 90 mmol/L — ABNORMAL LOW (ref 98–111)
Creatinine, Ser: 1.62 mg/dL — ABNORMAL HIGH (ref 0.44–1.00)
GFR calc Af Amer: 33 mL/min — ABNORMAL LOW (ref >60)
GFR calc non Af Amer: 29 mL/min — ABNORMAL LOW (ref >60)
Glucose, Bld: 111 mg/dL — ABNORMAL HIGH (ref 70–99)
Potassium: 3.2 mmol/L — ABNORMAL LOW (ref 3.5–5.1)
Sodium: 135 mmol/L (ref 135–145)
Total Bilirubin: 0.3 mg/dL (ref 0.3–1.2)
Total Protein: 6.6 g/dL (ref 6.5–8.1)

## 2019-09-01 LAB — CBC WITH DIFFERENTIAL/PLATELET
Abs Immature Granulocytes: 0.14 10*3/uL — ABNORMAL HIGH (ref 0.00–0.07)
Basophils Absolute: 0.1 10*3/uL (ref 0.0–0.1)
Basophils Relative: 0 %
Eosinophils Absolute: 0.2 10*3/uL (ref 0.0–0.5)
Eosinophils Relative: 1 %
HCT: 33.4 % — ABNORMAL LOW (ref 36.0–46.0)
Hemoglobin: 9.7 g/dL — ABNORMAL LOW (ref 12.0–15.0)
Immature Granulocytes: 1 %
Lymphocytes Relative: 13 %
Lymphs Abs: 1.6 10*3/uL (ref 0.7–4.0)
MCH: 23.2 pg — ABNORMAL LOW (ref 26.0–34.0)
MCHC: 29 g/dL — ABNORMAL LOW (ref 30.0–36.0)
MCV: 79.9 fL — ABNORMAL LOW (ref 80.0–100.0)
Monocytes Absolute: 0.9 10*3/uL (ref 0.1–1.0)
Monocytes Relative: 7 %
Neutro Abs: 9.4 10*3/uL — ABNORMAL HIGH (ref 1.7–7.7)
Neutrophils Relative %: 78 %
Platelets: 390 10*3/uL (ref 150–400)
RBC: 4.18 MIL/uL (ref 3.87–5.11)
RDW: 19.6 % — ABNORMAL HIGH (ref 11.5–15.5)
WBC: 12.2 10*3/uL — ABNORMAL HIGH (ref 4.0–10.5)
nRBC: 0 % (ref 0.0–0.2)

## 2019-09-01 LAB — SAMPLE TO BLOOD BANK

## 2019-09-01 MED ORDER — OXYCODONE HCL 10 MG PO TABS: 10.0000 mg | ORAL_TABLET | ORAL | 0 refills | Status: DC | PRN

## 2019-09-01 MED ORDER — SODIUM CHLORIDE 0.9% FLUSH
10.0000 mL | Freq: Once | INTRAVENOUS | Status: AC
  Administered 2019-09-01: 10 mL
  Filled 2019-09-01: qty 10

## 2019-09-01 MED ORDER — DRONABINOL 2.5 MG PO CAPS: 2.5000 mg | ORAL_CAPSULE | Freq: Two times a day (BID) | ORAL | 0 refills | Status: DC

## 2019-09-01 NOTE — Telephone Encounter (Signed)
Scheduled appt per 11/24 sch message - pt daughter is aware of appt

## 2019-09-01 NOTE — Assessment & Plan Note (Signed)
She has multifactorial pain, exacerbated by recent postherpetic neuralgia She has poor tolerance to all sorts of pain medications including long-acting pain medicine, Lyrica and gabapentin She is only taking oxycodone as needed She is also afraid to take pain medicine for fear of being dependent on narcotic prescription I tried to reassure the patient that we are using pain medicine in an appropriate fashion Her uncontrolled pain is affecting her quality of life

## 2019-09-01 NOTE — Assessment & Plan Note (Signed)
She is still quite weak with poor performance status I recommend we continue supportive care only I plan to see her again in about 11 days for further follow-up

## 2019-09-01 NOTE — Patient Instructions (Signed)
1) Weigh daily. Goal weight 168 to 170 pounds. Hold Lasix if your weight is lower than 168 pounds 2) PLEASE take oxycodone for pain. At night time, you may take 2 pills at each time if needed to get the pain under control 3) Start Marinol twice a day; that might improve appetite and nausea

## 2019-09-01 NOTE — Assessment & Plan Note (Signed)
She has mild acute on chronic renal failure secondary to dehydration I recommend her to hold off taking furosemide She felt slightly dehydrated We will try to get goal weight around 168 pounds before resumption of furosemide

## 2019-09-01 NOTE — Assessment & Plan Note (Signed)
She has multifactorial anemia, likely combination of anemia of chronic disease and chronic renal failure She does not need transfusion support today We will continue to monitor closely

## 2019-09-01 NOTE — Progress Notes (Signed)
[name] OFFICE PROGRESS NOTE  Patient Care Team: Asencion Noble, MD as PCP - General (Internal Medicine) Satira Sark, MD as PCP - Cardiology (Cardiology) Ahmed Prima, Fransisco Hertz, PA-C as Physician Assistant (Physician Assistant) Heath Lark, MD as Consulting Physician (Hematology and Oncology) Collene Gobble, MD as Consulting Physician (Pulmonary Disease)  ASSESSMENT & PLAN:  Left ovarian epithelial cancer [facility]) She is still quite weak with poor performance status I recommend we continue supportive care only I plan to see her again in about 11 days for further follow-up  Anemia, chronic disease She has multifactorial anemia, likely combination of anemia of chronic disease and chronic renal failure She does not need transfusion support today We will continue to monitor closely  CKD (chronic kidney disease) stage 3, GFR 30-59 ml/min ([place]) She has mild acute on chronic renal failure secondary to dehydration I recommend her to hold off taking furosemide She felt slightly dehydrated We will try to get goal weight around 168 pounds before resumption of furosemide  Cancer associated pain She has multifactorial pain, exacerbated by recent postherpetic neuralgia She has poor tolerance to all sorts of pain medications including long-acting pain medicine, Lyrica and gabapentin She is only taking oxycodone as needed She is also afraid to take pain medicine for fear of being dependent on narcotic prescription I tried to reassure the patient that we are using pain medicine in an appropriate fashion Her uncontrolled pain is affecting her quality of life   No orders of the defined types were placed in this encounter.   INTERVAL HISTORY: Please see below for problem oriented charting. She returns with her daughter, [name] for further follow-up She has developed some intermittent confusion while on gabapentin and Lyrica and those were discontinued She had severe pain last  night with difficulty sleeping The patient also disclosed her fear of taking oxycodone because she felt that she will become dependent on it Due to severe, neuropathic pain, she ultimately took oxycodone twice for mild pain relief She continues to have chronic shortness of breath but denies recent cough She felt dehydrated She has lost some weight since last time I saw her  SUMMARY OF ONCOLOGIC HISTORY: Oncology History Overview Note  High grade serous ER 90%, PR 0% BRCA 1: no loss of expression MMR normal  R1 resection, platinum refractory, progressed on Femara, carboplatin and Gemzar   Left ovarian epithelial cancer ([place])  02/18/2016 Tumor Marker   Patient's tumor was tested for the following markers: CA125 Results of the tumor marker test revealed 45   05/22/2016 Tumor Marker   Patient's tumor was tested for the following markers: CA125 Results of the tumor marker test revealed 53   05/22/2016 Imaging   Outside pelvic US showed 4.1 cm adnexa mass   06/24/2017 Imaging   Ct abdomen and pelvis:  1. Interim finding of moderate ascites within the abdomen and pelvis with additional finding of diffuse nodular infiltration of the omentum and anterior mesenteric fat, the appearance would be consistent with peritoneal carcinomatosis/metastatic disease. Increasing retroperitoneal and upper abdominal adenopathy. 2. Re- demonstrated 3.8 cm cyst in the right adnexa. Enlarging soft tissue density in the left adnexa now with possible cystic component posteriorly. In light of the above findings, concern is for ovarian neoplasm. Correlation with pelvic ultrasound recommended. 3. Small right-sided pleural effusion, new since prior study 4. Stable hypodense splenic lesions since 2017.    06/25/2017 Imaging   US pelvis: 2.9 cm simple appearing cyst in the right ovary. Left  ovary grossly unremarkable. Large volume ascites in the pelvis   06/30/2017 - 07/01/2017 Hospital Admission   She was admitted  for evaluation of abdominal pain and ascites   07/01/2017 Pathology Results   PERITONEAL/ASCITIC FLUID(SPECIMEN 1 OF 1 COLLECTED 07/01/17): - POORLY DIFFERENTIATED CARCINOMA; SEE COMMENT Source Peritoneal/Ascitic Fluid, (specimen 1 of 1 collected 07/01/17) Gross Specimen: Received is/are 1000 cc's of brownish fluid. (BS:bs) Prepared: # Smears: 0 # Concentration Technique Slides (i.e. ThinPrep): 1 # Cell Block: 1 Additional Studies: Also received Hematology slide - [id number]. Comment The tumor cells are positive for cytokeratin 7 and Pax-8 but negative for cytokeratin 20, CDX-2, GATA-3, Napsin-A and TTF-1. Based on the immunoprofile a gynecology primary is favored   07/01/2017 Procedure   Successful ultrasound-guided diagnostic and therapeutic paracentesis yielding 2.5 liters of peritoneal fluid   07/07/2017 - 07/09/2017 Hospital Admission   She was admitted for management of malignant ascites   07/08/2017 Procedure   Successful ultrasound-guided therapeutic paracentesis yielding 2.7 liters liters of peritoneal fluid   07/12/2017 Procedure   Successful ultrasound-guided paracentesis yielding 1450 mL of peritoneal fluid   07/18/2017 - 07/24/2017 Hospital Admission   She was admitted for expedited treatment   07/18/2017 Tumor Marker   Patient's tumor was tested for the following markers: CA125 Results of the tumor marker test revealed 1941   07/19/2017 - 02/04/2018 Chemotherapy   The patient had 6 cycles of carboplatin & Taxol for chemotherapy treatment, followed by 3 more cycles of carboplatin only    07/19/2017 - 02/04/2018 Chemotherapy   The patient had carboplatin and taxol   08/06/2017 Procedure   Successful ultrasound-guided therapeutic paracentesis yielding 2.6 liters of peritoneal fluid.   08/09/2017 Tumor Marker   Patient's tumor was tested for the following markers: CA125 Results of the tumor marker test revealed 1665   08/15/2017 Tumor Marker   Patient's tumor was tested  for the following markers: CA125 Results of the tumor marker test revealed 937.9   08/20/2017 Imaging   ECHO: Normal LV size with EF 60-65%. Normal RV size and systolic function. No significant valvular abnormalities.   09/18/2017 Imaging   Chest Impression:  1. No evidence thoracic metastasis. 2. Interval increase and RIGHT pleural effusion.  Abdomen / Pelvis Impression:  1. Interval decrease in intraperitoneal free fluid. 2. Interval decrease in omental nodularity in the LEFT ventral peritoneal space. 3. Interval decrease in nodularity associated with the LEFT ovary. 4. Cystic portion of the RIGHT ovary is increased mildly in size.   09/20/2017 Tumor Marker   Patient's tumor was tested for the following markers: CA125 Results of the tumor marker test revealed 347   10/14/2017 Tumor Marker   Patient's tumor was tested for the following markers: CA125 Results of the tumor marker test revealed 307.4   11/04/2017 Tumor Marker   Patient's tumor was tested for the following markers: CA125 Results of the tumor marker test revealed 262.5   11/28/2017 Imaging   1. Interval decrease in right pleural effusion with resolution of right atelectasis seen previously. 2. New small left pleural effusion, symmetric to the right. 3. No intraperitoneal free fluid on the current study. 4. Continued further decrease in left omental disease, appearing less confluent today than on the prior study. 5. Left ovary remains normal in appearance today and the right adnexal cystic lesion is decreased in size compared to prior study. 6. 14 mm pancreatic cyst is unchanged. Continued attention on follow-up imaging recommended. 7. Aortic Atherosclerois (ICD10-170.0)   12/13/2017 Tumor Marker  Patient's tumor was tested for the following markers: CA125 Results of the tumor marker test revealed 197.7   01/03/2018 Tumor Marker   Patient's tumor was tested for the following markers: CA125 Results of the tumor  marker test revealed 183.1   01/14/2018 Tumor Marker   Patient's tumor was tested for the following markers: CA125 Results of the tumor marker test revealed 177.4   02/04/2018 Tumor Marker   Patient's tumor was tested for the following markers: CA125 Results of the tumor marker test revealed 168.5   02/25/2018 Imaging   1. Omental carcinomatosis appears qualitatively stable to slightly decreased. Stable mild peritoneal thickening in the paracolic gutters. Stable right adnexal cyst. No ascites. No new or progressive metastatic disease in the abdomen or pelvis. 2. Small dependent right pleural effusion is increased. 3. Cystic pancreatic body lesion is decreased and now subcentimeter in size, suggesting a benign lesion. 4. Aortic Atherosclerosis (ICD10-I70.0).   03/03/2018 - 03/07/2018 Hospital Admission   She was hospitalized for GI bleed requiring blood transfusions. Xarelto was placed on hold   03/07/2018 PET scan   1. Persistent hazy omental interstitial nodularity but no hypermetabolism or discrete measurable nodules. No abdominal ascites. 2. No findings for metastatic disease involving the chest. 3. Moderate-sized right pleural effusion and small left pleural effusion.    03/20/2018 Pathology Results   1. Ovary and fallopian tube, right - OVARY AND FALLOPIAN TUBE INVOLVED BY SEROUS CARCINOMA. - PARATUBAL CYST. 2. Uterus +/- tubes/ovaries, neoplastic, cervix, left ovary and fallopian tube - LEFT OVARY: HIGH GRADE SEROUS CARCINOMA WITH TREATMENT EFFECT, SPANNING 2.5 CM. CARCINOMA INVOLVES OVARIAN SURFACE. SEE ONCOLOGY TABLE. - LEFT FALLOPIAN TUBE: INVOLVED BY SEROUS CARCINOMA. - UTERUS: -ENDOMETRIUM: INACTIVE ENDOMETRIUM. NO HYPERPLASIA OR MALIGNANCY. -MYOMETRIUM: UNREMARKABLE. NO MALIGNANCY. -SEROSA: INVOLVED BY SEROUS CARCINOMA. - CERVIX: ENDOCERVICAL POLYP. NO MALIGNANCY. 3. Omentum, resection for tumor - INVOLVED BY SEROUS CARCINOMA. 4. Soft tissue, biopsy, mesenteric  nodule - INVOLVED BY SEROUS CARCINOMA. Microscopic Comment 2. OVARY or FALLOPIAN TUBE or PRIMARY PERITONEUM: Procedure: Total hysterectomy and bilateral salpingo-oophorectomy. Omentectomy. Mesenteric lymph node biopsy. Specimen Integrity: Intact. Tumor Site: Left ovary. Ovarian Surface Involvement (required only if applicable): Present. Fallopian Tube Surface Involvement (required only if applicable): Present, bilateral. Tumor Size: 2.5 cm. Histologic Type: High grade serous carcinoma. Histologic Grade: High grade. Implants (required for advanced stage serous/seromucinous borderline tumors only): N/A. Other Tissue/ Organ Involvement: Bilateral fallopian tubes, right ovary, uterine serosa, omentum. Largest Extrapelvic Peritoneal Focus (required only if applicable): Microscopic, estimated 0.5 cm (omentum). Peritoneal/Ascitic Fluid: Prior Positive ([id number]). Treatment Effect (required only for high-grade serous carcinomas): Present in left ovary. CRS2. Regional Lymph Nodes: No lymph nodes submitted/identified. Pathologic Stage Classification (pTNM, AJCC 8th Edition): ypT3b, ypNX Representative Tumor Block: 1A, 1B, 553F, 53F. Comment(s): The right ovary has only surface deposits with a large paratubal cyst. The left ovary has intraparenchymal tumor with associated treatment effect. Thus the tumor location is classified as a left ovarian primary.   03/20/2018 Surgery   Procedure(s) Performed:  1. Exploratory laparotomy with total hysterectomy and bilateral salpingo-oophorectomy 2. Infragastic Omentectomy  3. Debulking to <1cm gross residual disease   Surgeon: Mart Piggs, MD  Specimens: Uterus Cervix, Bilateral tubes / ovaries and omentum. Mesenteric nodule.  Operative Findings: Debulked to gross residual disease <1cm; however there is miliary disease in multiple locations including the majority of the abdominal peritoneum (anterior abdominal wall, bilateral gutters),  diaphragm (Right>left), majority of small bowel mesentary. Normal appendix. Normal small uterus. Right ovary with a cystic  lesion ~3cm, some adhesive disease of right adnexa to rectum/sigmoid. Gross omental disease, which was resected with the omentectomy. Smooth liver surface, but again, diaphragmatic disease noted.      03/20/2018 Genetic Testing   Patient has genetic testing done for ER/PR. Results revealed patient has ER: 90%, PR 0%.    03/31/2018 Tumor Marker   Patient's tumor was tested for the following markers: CA125 Results of the tumor marker test revealed 215.8    Genetic Testing   Patient has genetic testing done for BRCA 1. Results revealed patient has the following: BRCA 1: no loss of expression.    Genetic Testing   Patient has genetic testing done for MMR . Results revealed patient has the following:  MMR: normal   03/31/2018 Genetic Testing   Patient has genetic testing done for BRCA1/2. Results revealed patient has no actionable mutations. She is found to have [place] genetic change of unknown significance   04/21/2018 Imaging   1. No definite findings of residual or recurrent metastatic disease in the abdomen or pelvis status post interval TAHBSO and omentectomy. Stable minimal thickening in the paracolic gutters without discrete peritoneal nodularity. 2. Trace free fluid in the pelvic cul-de-sac. 3. Stable small dependent right pleural effusion. 4. Subcentimeter pancreatic body cystic lesion is stable to slightly decreased. 5. Aortic Atherosclerosis (ICD10-I70.0).   04/21/2018 Tumor Marker   Patient's tumor was tested for the following markers: CA125 Results of the tumor marker test revealed 187.3   04/23/2018 - 09/12/2018 Anti-estrogen oral therapy   She is placed on Femara   05/08/2018 Procedure   Successful ultrasound guided right thoracentesis yielding 800 mL of pleural fluid. Fluid cytology is negative for malignancy    05/28/2018 Tumor Marker    Patient's tumor was tested for the following markers: CA125 Results of the tumor marker test revealed 190   06/30/2018 Tumor Marker   Patient's tumor was tested for the following markers: CA125 Results of the tumor marker test revealed 148   07/23/2018 Imaging   Status post hysterectomy and bilateral salpingo-oophorectomy.  Very mild peritoneal thickening/nodularity, equivocal but worrisome for very mild peritoneal disease. Attention on follow-up is suggested.  Small right pleural effusion with indwelling pleural drain.   07/23/2018 Tumor Marker   Patient's tumor was tested for the following markers: CA125 Results of the tumor marker test revealed 215   09/11/2018 Tumor Marker   Patient's tumor was tested for the following markers: CA-125. Results of the tumor marker test revealed 1323   09/12/2018 Imaging   1. Mildly progressive ascites with some mild degree of nodularity most appreciable along the left adnexa, right paracolic gutter, right upper quadrant, compatible with peritoneal spread of tumor. 2. Small right pleural effusion with indwelling right pleural catheter again noted. Mild increase in atelectasis anteriorly at the right lung base. 3. Aortic Atherosclerosis (ICD10-I70.0). Coronary atherosclerosis. 4. Several tiny hypodense lesions in the spleen are technically nonspecific, but stable. 5. Air fluid level in the rectum compatible with diarrheal process. 6. Prominent bilateral hip arthropathy. Left hip screw noted.   09/22/2018 - 02/02/2019 Chemotherapy   The patient had carboplatin and gemzar   09/29/2018 Tumor Marker   Patient's tumor was tested for the following markers: CA125 Results of the tumor marker test revealed 1219   09/29/2018 Adverse Reaction   Cycle 1 day 8 of Gemzar was omitted due to severe anemia   10/20/2018 Tumor Marker   Patient's tumor was tested for the following markers: CA125 Results of the tumor marker  test revealed 402   11/13/2018 Tumor  Marker   Patient's tumor was tested for the following markers: CA125 Results of the tumor marker test revealed 437   11/26/2018 Imaging   1. Mild residual peritoneal thickening, overall decreased from 09/11/2018, with resolution of previously seen ascites. 2. Tiny residual right pleural effusion with small bore chest tube in place. 3. Supraumbilical midline ventral hernia has a wide neck and contains unobstructed colon. 4.  Aortic atherosclerosis (ICD10-170.0).    01/05/2019 Tumor Marker   Patient's tumor was tested for the following markers: CA125 Results of the tumor marker test revealed 929   02/02/2019 Tumor Marker   Patient's tumor was tested for the following markers: CA125 Results of the tumor marker test revealed 1198   02/09/2019 Imaging   1. New mild ascites. Mild peritoneal thickening shows no significant change, but remains consistent with peritoneal carcinomatosis. 2. Increased tiny bilateral pleural effusions. 3. Partial small bowel obstruction with transition point in the anterior lower abdomen, just deep to the anterior abdominal wall. 4. Stable small ventral abdominal wall hernia containing transverse colon.   02/17/2019 -  Chemotherapy   The patient had PACLitaxel for chemotherapy treatment.    03/05/2019 Tumor Marker   Patient's tumor was tested for the following markers: CA125 Results of the tumor marker test revealed 1243   03/11/2019 Imaging   CXR Small bilateral subpulmonic effusions with basilar atelectasis. These are probably slightly larger than were seen in March.   03/30/2019 Tumor Marker   Patient's tumor was tested for the following markers: CA-125 Results of the tumor marker test revealed 776   05/01/2019 Tumor Marker   Patient's tumor was tested for the following markers: CA-125 Results of the tumor marker test revealed 180   05/19/2019 Tumor Marker   Patient's tumor was tested for the following markers: CA-125 Results of the tumor marker test  revealed 163   05/29/2019 Imaging   1. Interval resolution of small volume ascites noted within the pelvis. Continued mild peritoneal thickening consistent with peritoneal carcinomatosis. 2. No new or progressive findings identified within the chest, abdomen or pelvis. 3. Partial small bowel obstruction described on 02/09/2019 is similar to previous exam. 4. Unchanged appearance of small pleural effusions, right greater than left. 5. Cystic lesion within neck of pancreas is identified, indeterminate measuring 1.7 cm. Increased from 1.3 cm previously. According to consensus criteria follow-up imaging in 6 months with repeat contrast enhanced pancreas protocol MRI or CT is advised. This recommendation follows ACR consensus guidelines: Management of Incidental Pancreatic Cysts: A White Paper of the ACR Incidental Findings Committee. J Am Coll Radiol 5364;68:032-122. 6. Tiny small nonspecific nodules within the right upper lobe are unchanged from previous exam.     06/12/2019 Tumor Marker   Patient's tumor was tested for the following markers: CA-125 Results of the tumor marker test revealed 148   07/09/2019 - 07/12/2019 Hospital Admission   She had recurrent admissions to the hospital for pneumonia   07/21/2019 Tumor Marker   Patient's tumor was tested for the following markers: CA-125 Results of the tumor marker test revealed 442.   08/14/2019 Tumor Marker   Patient's tumor was tested for the following markers: CA-125 Results of the tumor marker test revealed 910.     REVIEW OF SYSTEMS:   Constitutional: Denies fevers, chills  Eyes: Denies blurriness of vision Ears, nose, mouth, throat, and face: Denies mucositis or sore throat Respiratory: Denies cough, dyspnea or wheezes Cardiovascular: Denies palpitation, chest discomfort or lower  extremity swelling Gastrointestinal:  Denies nausea, heartburn or change in bowel habits Skin: Denies abnormal skin rashes Lymphatics: Denies new  lymphadenopathy or easy bruising Behavioral/Psych: Mood is stable, no new changes  All other systems were reviewed with the patient and are negative.  I have reviewed the past medical history, past surgical history, social history and family history with the patient and they are unchanged from previous note.  ALLERGIES:  is allergic to codeine.  MEDICATIONS:  Current Outpatient Medications  Medication Sig Dispense Refill  . albuterol (PROAIR HFA) 108 (90 Base) MCG/ACT inhaler Inhale 2 puffs into the lungs every 6 (six) hours as needed for wheezing or shortness of breath. 18 g 5  . ALPRAZolam (XANAX) 0.25 MG tablet Take 1 tablet (0.25 mg total) by mouth at bedtime as needed for anxiety. 5 tablet 0  . azelastine (ASTELIN) 0.1 % nasal spray Place 2 sprays into both nostrils 2 (two) times daily as needed for allergies.     . Cholecalciferol (VITAMIN D) 2000 units CAPS Take 2,000 Units by mouth daily.     Marland Kitchen dicyclomine (BENTYL) 10 MG capsule TAKE 1 CAPSULE(10 MG) BY MOUTH TWICE DAILY (Patient taking differently: Take 10 mg by mouth 2 (two) times daily. ) 60 capsule 1  . diltiazem (CARDIZEM CD) 180 MG 24 hr capsule Take 1 capsule (180 mg total) by mouth 2 (two) times a day. 180 capsule 3  . diphenhydrAMINE (BENADRYL) 25 mg capsule Take 25 mg by mouth every 8 (eight) hours as needed for itching.     . dronabinol (MARINOL) 2.5 MG capsule Take 1 capsule (2.5 mg total) by mouth 2 (two) times daily before a meal. 60 capsule 0  . DULoxetine (CYMBALTA) 60 MG capsule Take 1 capsule (60 mg total) by mouth at bedtime. 30 capsule 3  . fluticasone (FLONASE) 50 MCG/ACT nasal spray Place 2 sprays into both nostrils 2 (two) times daily as needed (FOR NASAL CONGESTION.).     Marland Kitchen Fluticasone Furoate (ARNUITY ELLIPTA) 100 MCG/ACT AEPB Inhale 1 puff into the lungs daily. 2 each 0  . furosemide (LASIX) 40 MG tablet Take 1 tablet (40 mg total) by mouth daily. 90 tablet 3  . halobetasol (ULTRAVATE) 0.05 % cream Apply 1  application topically 2 (two) times daily as needed (psoriasis).     . hydrocortisone valerate cream (WESTCORT) 0.2 % Apply 1 application topically 2 (two) times daily. 45 g 0  . levothyroxine (SYNTHROID, LEVOTHROID) 137 MCG tablet Take 137 mcg by mouth daily before breakfast. For thyroid therapy    . lidocaine (LIDODERM) 5 % Place 1 patch onto the skin daily. Remove & Discard patch within 12 hours or as directed by MD 30 patch 0  . Magnesium 400 MG CAPS Take 400 mg by mouth daily.     . mirtazapine (REMERON) 15 MG tablet Take 1 tablet (15 mg total) by mouth at bedtime. 30 tablet 9  . Oxycodone HCl 10 MG TABS Take 1 tablet (10 mg total) by mouth every 4 (four) hours as needed. 90 tablet 0  . polyvinyl alcohol (LIQUIFILM TEARS) 1.4 % ophthalmic solution Place 1 drop into both eyes 2 (two) times daily as needed for dry eyes.     . potassium chloride SA (K-DUR,KLOR-CON) 20 MEQ tablet TAKE 1 TABLET BY MOUTH DAILY AND TAKE 1 ADDITIONAL TABLET WHEN TAKING EXTRA LASIX DOSE (Patient taking differently: Take 20 mEq by mouth daily. ) 60 tablet 6  . prednisoLONE acetate (PRED FORTE) 1 % ophthalmic suspension PLACE  1 GTT INTO OU QID FOR 1 WEEK THEN BID FOR 1 WEEK    . Rivaroxaban (XARELTO) 15 MG TABS tablet Take 15 mg by mouth daily.    . Tiotropium Bromide-Olodaterol (STIOLTO RESPIMAT) 2.5-2.5 MCG/ACT AERS Inhale 2 puffs into the lungs daily. 4 g 11   No current facility-administered medications for this visit.    Facility-Administered Medications Ordered in Other Visits  Medication Dose Route Frequency Provider Last Rate Last Dose  . 0.9 %  sodium chloride infusion (Manually program via Guardrails IV Fluids)  250 mL Intravenous Once Heath Lark, MD   Stopped at 12/16/18 1500    PHYSICAL EXAMINATION: ECOG PERFORMANCE STATUS: 3 - Symptomatic, >50% confined to bed  Vitals:   09/01/19 1033  BP: (!) 141/60  Pulse: 94  Resp: 17  Temp: 98 F (36.7 C)  SpO2: 97%   Filed Weights   09/01/19 1033   Weight: 163 lb 11.2 oz (74.3 kg)    GENERAL:alert, no distress and comfortable.  She looks frail and debilitated, with oxygen delivered via nasal cannula, sitting on the wheelchair SKIN: skin color, texture, turgor are normal, no rashes or significant lesions EYES: normal, Conjunctiva are pink and non-injected, sclera clear OROPHARYNX:no exudate, no erythema and lips, buccal mucosa, and tongue normal  NECK: supple, thyroid normal size, non-tender, without nodularity LYMPH:  no palpable lymphadenopathy in the cervical, axillary or inguinal LUNGS: clear to auscultation and percussion with normal breathing effort HEART: regular rate & rhythm and no murmurs and no lower extremity edema ABDOMEN:abdomen soft, non-tender and normal bowel sounds Musculoskeletal:no cyanosis of digits and no clubbing  NEURO: alert & oriented x 3 with fluent speech, no focal motor/sensory deficits  LABORATORY DATA:  I have reviewed the data as listed    Component Value Date/Time   NA 135 09/01/2019 0943   NA 135 (L) 09/24/2017 0945   K 3.2 (L) 09/01/2019 0943   K 3.7 09/24/2017 0945   CL 90 (L) 09/01/2019 0943   CO2 35 (H) 09/01/2019 0943   CO2 30 (H) 09/24/2017 0945   GLUCOSE 111 (H) 09/01/2019 0943   GLUCOSE 101 09/24/2017 0945   BUN 19 09/01/2019 0943   BUN 15.3 09/24/2017 0945   CREATININE 1.62 (H) 09/01/2019 0943   CREATININE 1.11 (H) 06/24/2019 0848   CREATININE 0.8 09/24/2017 0945   CALCIUM 8.3 (L) 09/01/2019 0943   CALCIUM 9.1 09/24/2017 0945   PROT 6.6 09/01/2019 0943   PROT 6.6 09/24/2017 0945   ALBUMIN 2.8 (L) 09/01/2019 0943   ALBUMIN 3.3 (L) 09/24/2017 0945   AST 20 09/01/2019 0943   AST 18 06/24/2019 0848   AST 16 09/24/2017 0945   ALT 11 09/01/2019 0943   ALT 9 06/24/2019 0848   ALT 12 09/24/2017 0945   ALKPHOS 53 09/01/2019 0943   ALKPHOS 52 09/24/2017 0945   BILITOT 0.3 09/01/2019 0943   BILITOT 0.4 06/24/2019 0848   BILITOT 1.13 09/24/2017 0945   GFRNONAA 29 (L) 09/01/2019  0943   GFRNONAA 46 (L) 06/24/2019 0848   GFRAA 33 (L) 09/01/2019 0943   GFRAA 53 (L) 06/24/2019 0848    No results found for: SPEP, UPEP  Lab Results  Component Value Date   WBC 12.2 (H) 09/01/2019   NEUTROABS 9.4 (H) 09/01/2019   HGB 9.7 (L) 09/01/2019   HCT 33.4 (L) 09/01/2019   MCV 79.9 (L) 09/01/2019   PLT 390 09/01/2019      Chemistry      Component Value Date/Time  NA 135 09/01/2019 0943   NA 135 (L) 09/24/2017 0945   K 3.2 (L) 09/01/2019 0943   K 3.7 09/24/2017 0945   CL 90 (L) 09/01/2019 0943   CO2 35 (H) 09/01/2019 0943   CO2 30 (H) 09/24/2017 0945   BUN 19 09/01/2019 0943   BUN 15.3 09/24/2017 0945   CREATININE 1.62 (H) 09/01/2019 0943   CREATININE 1.11 (H) 06/24/2019 0848   CREATININE 0.8 09/24/2017 0945      Component Value Date/Time   CALCIUM 8.3 (L) 09/01/2019 0943   CALCIUM 9.1 09/24/2017 0945   ALKPHOS 53 09/01/2019 0943   ALKPHOS 52 09/24/2017 0945   AST 20 09/01/2019 0943   AST 18 06/24/2019 0848   AST 16 09/24/2017 0945   ALT 11 09/01/2019 0943   ALT 9 06/24/2019 0848   ALT 12 09/24/2017 0945   BILITOT 0.3 09/01/2019 0943   BILITOT 0.4 06/24/2019 0848   BILITOT 1.13 09/24/2017 0945       All questions were answered. The patient knows to call the clinic with any problems, questions or concerns. No barriers to learning was detected.  I spent 25 minutes counseling the patient face to face. The total time spent in the appointment was 30 minutes and more than 50% was on counseling and review of test results  Heath Lark, MD 09/01/2019 1:03 PM

## 2019-09-04 ENCOUNTER — Telehealth: Payer: Self-pay

## 2019-09-04 NOTE — Telephone Encounter (Signed)
[name] called about Marinol Rx. It needs prior authorization.  Called [facility] they will fax over prior authorization.  Called [name] back. Told her I just got prior authorization form and gave it to the person who fills them out. She verbalized understanding.

## 2019-09-05 ENCOUNTER — Other Ambulatory Visit: Payer: Self-pay | Admitting: Hematology and Oncology

## 2019-09-06 ENCOUNTER — Other Ambulatory Visit: Payer: Self-pay | Admitting: Internal Medicine

## 2019-09-07 ENCOUNTER — Telehealth: Payer: Self-pay

## 2019-09-07 ENCOUNTER — Other Ambulatory Visit: Payer: Self-pay | Admitting: Hematology and Oncology

## 2019-09-07 MED ORDER — DULOXETINE HCL 60 MG PO CPEP: 60.0000 mg | ORAL_CAPSULE | Freq: Every day | ORAL | 3 refills | Status: DC

## 2019-09-07 NOTE — Telephone Encounter (Signed)
Refill done.  

## 2019-09-07 NOTE — Telephone Encounter (Signed)
Called [name] and given below message. She verbalized understanding. Her Mom is still taking the Cymbalta at night. She would like a refill.

## 2019-09-07 NOTE — Telephone Encounter (Signed)
-----   Message from Heath Lark, MD sent at 09/07/2019  8:20 AM EST ----- Regarding: cymbalta refill I received an electronic refill request and I denied the refill because she had significant confusion episodes with lyrica and gabapentin Can you call [name] and ask if she is taking cymbalta? If she truly need refill let me know

## 2019-09-11 ENCOUNTER — Other Ambulatory Visit: Payer: Self-pay

## 2019-09-11 ENCOUNTER — Inpatient Hospital Stay: Payer: Medicare Other

## 2019-09-11 ENCOUNTER — Encounter: Payer: Self-pay | Admitting: Hematology and Oncology

## 2019-09-11 ENCOUNTER — Inpatient Hospital Stay: Payer: Medicare Other | Attending: Hematology and Oncology | Admitting: Hematology and Oncology

## 2019-09-11 DIAGNOSIS — Z79899 Other long term (current) drug therapy: Secondary | ICD-10-CM | POA: Insufficient documentation

## 2019-09-11 DIAGNOSIS — R5381 Other malaise: Secondary | ICD-10-CM | POA: Insufficient documentation

## 2019-09-11 DIAGNOSIS — D638 Anemia in other chronic diseases classified elsewhere: Secondary | ICD-10-CM | POA: Diagnosis not present

## 2019-09-11 DIAGNOSIS — Z7901 Long term (current) use of anticoagulants: Secondary | ICD-10-CM | POA: Insufficient documentation

## 2019-09-11 DIAGNOSIS — Z9221 Personal history of antineoplastic chemotherapy: Secondary | ICD-10-CM | POA: Diagnosis not present

## 2019-09-11 DIAGNOSIS — E860 Dehydration: Secondary | ICD-10-CM | POA: Insufficient documentation

## 2019-09-11 DIAGNOSIS — C786 Secondary malignant neoplasm of retroperitoneum and peritoneum: Secondary | ICD-10-CM | POA: Insufficient documentation

## 2019-09-11 DIAGNOSIS — J90 Pleural effusion, not elsewhere classified: Secondary | ICD-10-CM | POA: Insufficient documentation

## 2019-09-11 DIAGNOSIS — Z7189 Other specified counseling: Secondary | ICD-10-CM

## 2019-09-11 DIAGNOSIS — N183 Chronic kidney disease, stage 3 unspecified: Secondary | ICD-10-CM | POA: Diagnosis not present

## 2019-09-11 DIAGNOSIS — C562 Malignant neoplasm of left ovary: Secondary | ICD-10-CM | POA: Diagnosis present

## 2019-09-11 DIAGNOSIS — R110 Nausea: Secondary | ICD-10-CM | POA: Diagnosis not present

## 2019-09-11 DIAGNOSIS — J449 Chronic obstructive pulmonary disease, unspecified: Secondary | ICD-10-CM | POA: Insufficient documentation

## 2019-09-11 DIAGNOSIS — G893 Neoplasm related pain (acute) (chronic): Secondary | ICD-10-CM | POA: Diagnosis not present

## 2019-09-11 LAB — COMPREHENSIVE METABOLIC PANEL
ALT: 11 U/L (ref 0–44)
AST: 23 U/L (ref 15–41)
Albumin: 2.6 g/dL — ABNORMAL LOW (ref 3.5–5.0)
Alkaline Phosphatase: 57 U/L (ref 38–126)
Anion gap: 6 (ref 5–15)
BUN: 20 mg/dL (ref 8–23)
CO2: 30 mmol/L (ref 22–32)
Calcium: 7.9 mg/dL — ABNORMAL LOW (ref 8.9–10.3)
Chloride: 98 mmol/L (ref 98–111)
Creatinine, Ser: 1.28 mg/dL — ABNORMAL HIGH (ref 0.44–1.00)
GFR calc Af Amer: 44 mL/min — ABNORMAL LOW (ref >60)
GFR calc non Af Amer: 38 mL/min — ABNORMAL LOW (ref >60)
Glucose, Bld: 101 mg/dL — ABNORMAL HIGH (ref 70–99)
Potassium: 4.5 mmol/L (ref 3.5–5.1)
Sodium: 134 mmol/L — ABNORMAL LOW (ref 135–145)
Total Bilirubin: 0.4 mg/dL (ref 0.3–1.2)
Total Protein: 6.1 g/dL — ABNORMAL LOW (ref 6.5–8.1)

## 2019-09-11 LAB — CBC WITH DIFFERENTIAL/PLATELET
Abs Immature Granulocytes: 0.15 10*3/uL — ABNORMAL HIGH (ref 0.00–0.07)
Basophils Absolute: 0.1 10*3/uL (ref 0.0–0.1)
Basophils Relative: 1 %
Eosinophils Absolute: 0.2 10*3/uL (ref 0.0–0.5)
Eosinophils Relative: 2 %
HCT: 32.1 % — ABNORMAL LOW (ref 36.0–46.0)
Hemoglobin: 9.4 g/dL — ABNORMAL LOW (ref 12.0–15.0)
Immature Granulocytes: 1 %
Lymphocytes Relative: 10 %
Lymphs Abs: 1.4 10*3/uL (ref 0.7–4.0)
MCH: 23.2 pg — ABNORMAL LOW (ref 26.0–34.0)
MCHC: 29.3 g/dL — ABNORMAL LOW (ref 30.0–36.0)
MCV: 79.3 fL — ABNORMAL LOW (ref 80.0–100.0)
Monocytes Absolute: 1.1 10*3/uL — ABNORMAL HIGH (ref 0.1–1.0)
Monocytes Relative: 8 %
Neutro Abs: 11.8 10*3/uL — ABNORMAL HIGH (ref 1.7–7.7)
Neutrophils Relative %: 78 %
Platelets: 304 10*3/uL (ref 150–400)
RBC: 4.05 MIL/uL (ref 3.87–5.11)
RDW: 19.7 % — ABNORMAL HIGH (ref 11.5–15.5)
WBC: 14.8 10*3/uL — ABNORMAL HIGH (ref 4.0–10.5)
nRBC: 0 % (ref 0.0–0.2)

## 2019-09-11 LAB — SAMPLE TO BLOOD BANK

## 2019-09-11 MED ORDER — SODIUM CHLORIDE 0.9% FLUSH
10.0000 mL | Freq: Once | INTRAVENOUS | Status: AC
  Administered 2019-09-11: 10 mL
  Filled 2019-09-11: qty 10

## 2019-09-11 NOTE — Assessment & Plan Note (Signed)
She has multifactorial anemia, likely combination of anemia of chronic disease and chronic renal failure We will continue to monitor closely and transfuse as needed to keep her hemoglobin greater than 8

## 2019-09-11 NOTE — Assessment & Plan Note (Signed)
She has multifactorial pain, exacerbated by recent postherpetic neuralgia She has poor tolerance to all sorts of pain medications including long-acting pain medicine, Lyrica and gabapentin She is only taking oxycodone as needed She is also afraid to take pain medicine for fear of being dependent on narcotic prescription I tried to reassure the patient that we are using pain medicine in an appropriate fashion Her uncontrolled pain is affecting her quality of life

## 2019-09-11 NOTE — Assessment & Plan Note (Addendum)
She had recent mild acute on chronic renal failure secondary to dehydration Her renal function is back to her baseline I recommend she resume half a dose of Lasix We will continue to adjust her medication as needed

## 2019-09-11 NOTE — Progress Notes (Signed)
[facility] OFFICE PROGRESS NOTE  Patient Care Team: Asencion Noble, MD as PCP - General (Internal Medicine) Satira Sark, MD as PCP - Cardiology (Cardiology) Ahmed Prima, Fransisco Hertz, PA-C as Physician Assistant (Physician Assistant) Heath Lark, MD as Consulting Physician (Hematology and Oncology) Collene Gobble, MD as Consulting Physician (Pulmonary Disease)  ASSESSMENT & PLAN:  Left ovarian epithelial cancer [facility]) She is still quite weak with poor performance status I recommend we continue supportive care only I plan to see her again in about 2 weeks for further follow-up  Anemia, chronic disease She has multifactorial anemia, likely combination of anemia of chronic disease and chronic renal failure We will continue to monitor closely and transfuse as needed to keep her hemoglobin greater than 8  CKD (chronic kidney disease) stage 3, GFR 30-59 ml/min ([place]) She had recent mild acute on chronic renal failure secondary to dehydration Her renal function is back to her baseline I recommend she resume half a dose of Lasix We will continue to adjust her medication as needed  Cancer associated pain She has multifactorial pain, exacerbated by recent postherpetic neuralgia She has poor tolerance to all sorts of pain medications including long-acting pain medicine, Lyrica and gabapentin She is only taking oxycodone as needed She is also afraid to take pain medicine for fear of being dependent on narcotic prescription I tried to reassure the patient that we are using pain medicine in an appropriate fashion Her uncontrolled pain is affecting her quality of life  Pleural effusion She has COPD on home oxygen and recurrent pleural effusion due to malignancy She has appointment to follow with pulmonologist next week for further management  Goals of care, counseling/discussion The patient has profound debility and deconditioning She is not a candidate for chemotherapy I will  continue to see her every 2 weeks for supportive care   No orders of the defined types were placed in this encounter.   INTERVAL HISTORY: Please see below for problem oriented charting. She returns with her daughter, [name] for further follow-up Since last time I saw her, she is eating better and has gained back some weight Her daughter noticed that the patient appear forgetful and repeatedly asked similar questions and does not remember conversation earlier She also have intermittent confusion No recent falls She is very weak, could not stand more than 10 seconds or walk a few steps without assistance She is dependent on caregiver for all activities of daily living No recent bleeding Her appetite has improved with Marinol Nausea has also improved She is taking less oxycodone but still have persistent, severe pain from her recent shingles  SUMMARY OF ONCOLOGIC HISTORY: Oncology History Overview Note  High grade serous ER 90%, PR 0% BRCA 1: no loss of expression MMR normal  R1 resection, platinum refractory, progressed on Femara, carboplatin and Gemzar   Left ovarian epithelial cancer ([place])  02/18/2016 Tumor Marker   Patient's tumor was tested for the following markers: CA125 Results of the tumor marker test revealed 45   05/22/2016 Tumor Marker   Patient's tumor was tested for the following markers: CA125 Results of the tumor marker test revealed 53   05/22/2016 Imaging   Outside pelvic US showed 4.1 cm adnexa mass   06/24/2017 Imaging   Ct abdomen and pelvis:  1. Interim finding of moderate ascites within the abdomen and pelvis with additional finding of diffuse nodular infiltration of the omentum and anterior mesenteric fat, the appearance would be consistent with peritoneal carcinomatosis/metastatic disease.  Increasing retroperitoneal and upper abdominal adenopathy. 2. Re- demonstrated 3.8 cm cyst in the right adnexa. Enlarging soft tissue density in the left adnexa now  with possible cystic component posteriorly. In light of the above findings, concern is for ovarian neoplasm. Correlation with pelvic ultrasound recommended. 3. Small right-sided pleural effusion, new since prior study 4. Stable hypodense splenic lesions since 2017.    06/25/2017 Imaging   US pelvis: 2.9 cm simple appearing cyst in the right ovary. Left ovary grossly unremarkable. Large volume ascites in the pelvis   06/30/2017 - 07/01/2017 Hospital Admission   She was admitted for evaluation of abdominal pain and ascites   07/01/2017 Pathology Results   PERITONEAL/ASCITIC FLUID(SPECIMEN 1 OF 1 COLLECTED 07/01/17): - POORLY DIFFERENTIATED CARCINOMA; SEE COMMENT Source Peritoneal/Ascitic Fluid, (specimen 1 of 1 collected 07/01/17) Gross Specimen: Received is/are 1000 cc's of brownish fluid. (BS:bs) Prepared: # Smears: 0 # Concentration Technique Slides (i.e. ThinPrep): 1 # Cell Block: 1 Additional Studies: Also received Hematology slide - [id number]. Comment The tumor cells are positive for cytokeratin 7 and Pax-8 but negative for cytokeratin 20, CDX-2, GATA-3, Napsin-A and TTF-1. Based on the immunoprofile a gynecology primary is favored   07/01/2017 Procedure   Successful ultrasound-guided diagnostic and therapeutic paracentesis yielding 2.5 liters of peritoneal fluid   07/07/2017 - 07/09/2017 Hospital Admission   She was admitted for management of malignant ascites   07/08/2017 Procedure   Successful ultrasound-guided therapeutic paracentesis yielding 2.7 liters liters of peritoneal fluid   07/12/2017 Procedure   Successful ultrasound-guided paracentesis yielding 1450 mL of peritoneal fluid   07/18/2017 - 07/24/2017 Hospital Admission   She was admitted for expedited treatment   07/18/2017 Tumor Marker   Patient's tumor was tested for the following markers: CA125 Results of the tumor marker test revealed 1941   07/19/2017 - 02/04/2018 Chemotherapy   The patient had 6 cycles of  carboplatin & Taxol for chemotherapy treatment, followed by 3 more cycles of carboplatin only    07/19/2017 - 02/04/2018 Chemotherapy   The patient had carboplatin and taxol   08/06/2017 Procedure   Successful ultrasound-guided therapeutic paracentesis yielding 2.6 liters of peritoneal fluid.   08/09/2017 Tumor Marker   Patient's tumor was tested for the following markers: CA125 Results of the tumor marker test revealed 1665   08/15/2017 Tumor Marker   Patient's tumor was tested for the following markers: CA125 Results of the tumor marker test revealed 937.9   08/20/2017 Imaging   ECHO: Normal LV size with EF 60-65%. Normal RV size and systolic function. No significant valvular abnormalities.   09/18/2017 Imaging   Chest Impression:  1. No evidence thoracic metastasis. 2. Interval increase and RIGHT pleural effusion.  Abdomen / Pelvis Impression:  1. Interval decrease in intraperitoneal free fluid. 2. Interval decrease in omental nodularity in the LEFT ventral peritoneal space. 3. Interval decrease in nodularity associated with the LEFT ovary. 4. Cystic portion of the RIGHT ovary is increased mildly in size.   09/20/2017 Tumor Marker   Patient's tumor was tested for the following markers: CA125 Results of the tumor marker test revealed 347   10/14/2017 Tumor Marker   Patient's tumor was tested for the following markers: CA125 Results of the tumor marker test revealed 307.4   11/04/2017 Tumor Marker   Patient's tumor was tested for the following markers: CA125 Results of the tumor marker test revealed 262.5   11/28/2017 Imaging   1. Interval decrease in right pleural effusion with resolution of right atelectasis seen previously. 2.  New small left pleural effusion, symmetric to the right. 3. No intraperitoneal free fluid on the current study. 4. Continued further decrease in left omental disease, appearing less confluent today than on the prior study. 5. Left ovary remains  normal in appearance today and the right adnexal cystic lesion is decreased in size compared to prior study. 6. 14 mm pancreatic cyst is unchanged. Continued attention on follow-up imaging recommended. 7. Aortic Atherosclerois (ICD10-170.0)   12/13/2017 Tumor Marker   Patient's tumor was tested for the following markers: CA125 Results of the tumor marker test revealed 197.7   01/03/2018 Tumor Marker   Patient's tumor was tested for the following markers: CA125 Results of the tumor marker test revealed 183.1   01/14/2018 Tumor Marker   Patient's tumor was tested for the following markers: CA125 Results of the tumor marker test revealed 177.4   02/04/2018 Tumor Marker   Patient's tumor was tested for the following markers: CA125 Results of the tumor marker test revealed 168.5   02/25/2018 Imaging   1. Omental carcinomatosis appears qualitatively stable to slightly decreased. Stable mild peritoneal thickening in the paracolic gutters. Stable right adnexal cyst. No ascites. No new or progressive metastatic disease in the abdomen or pelvis. 2. Small dependent right pleural effusion is increased. 3. Cystic pancreatic body lesion is decreased and now subcentimeter in size, suggesting a benign lesion. 4. Aortic Atherosclerosis (ICD10-I70.0).   03/03/2018 - 03/07/2018 Hospital Admission   She was hospitalized for GI bleed requiring blood transfusions. Xarelto was placed on hold   03/07/2018 PET scan   1. Persistent hazy omental interstitial nodularity but no hypermetabolism or discrete measurable nodules. No abdominal ascites. 2. No findings for metastatic disease involving the chest. 3. Moderate-sized right pleural effusion and small left pleural effusion.    03/20/2018 Pathology Results   1. Ovary and fallopian tube, right - OVARY AND FALLOPIAN TUBE INVOLVED BY SEROUS CARCINOMA. - PARATUBAL CYST. 2. Uterus +/- tubes/ovaries, neoplastic, cervix, left ovary and fallopian tube - LEFT OVARY:  HIGH GRADE SEROUS CARCINOMA WITH TREATMENT EFFECT, SPANNING 2.5 CM. CARCINOMA INVOLVES OVARIAN SURFACE. SEE ONCOLOGY TABLE. - LEFT FALLOPIAN TUBE: INVOLVED BY SEROUS CARCINOMA. - UTERUS: -ENDOMETRIUM: INACTIVE ENDOMETRIUM. NO HYPERPLASIA OR MALIGNANCY. -MYOMETRIUM: UNREMARKABLE. NO MALIGNANCY. -SEROSA: INVOLVED BY SEROUS CARCINOMA. - CERVIX: ENDOCERVICAL POLYP. NO MALIGNANCY. 3. Omentum, resection for tumor - INVOLVED BY SEROUS CARCINOMA. 4. Soft tissue, biopsy, mesenteric nodule - INVOLVED BY SEROUS CARCINOMA. Microscopic Comment 2. OVARY or FALLOPIAN TUBE or PRIMARY PERITONEUM: Procedure: Total hysterectomy and bilateral salpingo-oophorectomy. Omentectomy. Mesenteric lymph node biopsy. Specimen Integrity: Intact. Tumor Site: Left ovary. Ovarian Surface Involvement (required only if applicable): Present. Fallopian Tube Surface Involvement (required only if applicable): Present, bilateral. Tumor Size: 2.5 cm. Histologic Type: High grade serous carcinoma. Histologic Grade: High grade. Implants (required for advanced stage serous/seromucinous borderline tumors only): N/A. Other Tissue/ Organ Involvement: Bilateral fallopian tubes, right ovary, uterine serosa, omentum. Largest Extrapelvic Peritoneal Focus (required only if applicable): Microscopic, estimated 0.5 cm (omentum). Peritoneal/Ascitic Fluid: Prior Positive ([id number]). Treatment Effect (required only for high-grade serous carcinomas): Present in left ovary. CRS2. Regional Lymph Nodes: No lymph nodes submitted/identified. Pathologic Stage Classification (pTNM, AJCC 8th Edition): ypT3b, ypNX Representative Tumor Block: 1A, 1B, 64F, 31F. Comment(s): The right ovary has only surface deposits with a large paratubal cyst. The left ovary has intraparenchymal tumor with associated treatment effect. Thus the tumor location is classified as a left ovarian primary.   03/20/2018 Surgery   Procedure(s) Performed:  1. Exploratory laparotomy  with total  hysterectomy and bilateral salpingo-oophorectomy 2. Infragastic Omentectomy  3. Debulking to <1cm gross residual disease   Surgeon: Mart Piggs, MD  Specimens: Uterus Cervix, Bilateral tubes / ovaries and omentum. Mesenteric nodule.  Operative Findings: Debulked to gross residual disease <1cm; however there is miliary disease in multiple locations including the majority of the abdominal peritoneum (anterior abdominal wall, bilateral gutters), diaphragm (Right>left), majority of small bowel mesentary. Normal appendix. Normal small uterus. Right ovary with a cystic lesion ~3cm, some adhesive disease of right adnexa to rectum/sigmoid. Gross omental disease, which was resected with the omentectomy. Smooth liver surface, but again, diaphragmatic disease noted.      03/20/2018 Genetic Testing   Patient has genetic testing done for ER/PR. Results revealed patient has ER: 90%, PR 0%.    03/31/2018 Tumor Marker   Patient's tumor was tested for the following markers: CA125 Results of the tumor marker test revealed 215.8    Genetic Testing   Patient has genetic testing done for BRCA 1. Results revealed patient has the following: BRCA 1: no loss of expression.    Genetic Testing   Patient has genetic testing done for MMR . Results revealed patient has the following:  MMR: normal   03/31/2018 Genetic Testing   Patient has genetic testing done for BRCA1/2. Results revealed patient has no actionable mutations. She is found to have [place] genetic change of unknown significance   04/21/2018 Imaging   1. No definite findings of residual or recurrent metastatic disease in the abdomen or pelvis status post interval TAHBSO and omentectomy. Stable minimal thickening in the paracolic gutters without discrete peritoneal nodularity. 2. Trace free fluid in the pelvic cul-de-sac. 3. Stable small dependent right pleural effusion. 4. Subcentimeter pancreatic body cystic lesion is  stable to slightly decreased. 5. Aortic Atherosclerosis (ICD10-I70.0).   04/21/2018 Tumor Marker   Patient's tumor was tested for the following markers: CA125 Results of the tumor marker test revealed 187.3   04/23/2018 - 09/12/2018 Anti-estrogen oral therapy   She is placed on Femara   05/08/2018 Procedure   Successful ultrasound guided right thoracentesis yielding 800 mL of pleural fluid. Fluid cytology is negative for malignancy    05/28/2018 Tumor Marker   Patient's tumor was tested for the following markers: CA125 Results of the tumor marker test revealed 190   06/30/2018 Tumor Marker   Patient's tumor was tested for the following markers: CA125 Results of the tumor marker test revealed 148   07/23/2018 Imaging   Status post hysterectomy and bilateral salpingo-oophorectomy.  Very mild peritoneal thickening/nodularity, equivocal but worrisome for very mild peritoneal disease. Attention on follow-up is suggested.  Small right pleural effusion with indwelling pleural drain.   07/23/2018 Tumor Marker   Patient's tumor was tested for the following markers: CA125 Results of the tumor marker test revealed 215   09/11/2018 Tumor Marker   Patient's tumor was tested for the following markers: CA-125. Results of the tumor marker test revealed 1323   09/12/2018 Imaging   1. Mildly progressive ascites with some mild degree of nodularity most appreciable along the left adnexa, right paracolic gutter, right upper quadrant, compatible with peritoneal spread of tumor. 2. Small right pleural effusion with indwelling right pleural catheter again noted. Mild increase in atelectasis anteriorly at the right lung base. 3. Aortic Atherosclerosis (ICD10-I70.0). Coronary atherosclerosis. 4. Several tiny hypodense lesions in the spleen are technically nonspecific, but stable. 5. Air fluid level in the rectum compatible with diarrheal process. 6. Prominent bilateral hip arthropathy. Left  hip screw  noted.   09/22/2018 - 02/02/2019 Chemotherapy   The patient had carboplatin and gemzar   09/29/2018 Tumor Marker   Patient's tumor was tested for the following markers: CA125 Results of the tumor marker test revealed 1219   09/29/2018 Adverse Reaction   Cycle 1 day 8 of Gemzar was omitted due to severe anemia   10/20/2018 Tumor Marker   Patient's tumor was tested for the following markers: CA125 Results of the tumor marker test revealed 402   11/13/2018 Tumor Marker   Patient's tumor was tested for the following markers: CA125 Results of the tumor marker test revealed 437   11/26/2018 Imaging   1. Mild residual peritoneal thickening, overall decreased from 09/11/2018, with resolution of previously seen ascites. 2. Tiny residual right pleural effusion with small bore chest tube in place. 3. Supraumbilical midline ventral hernia has a wide neck and contains unobstructed colon. 4.  Aortic atherosclerosis (ICD10-170.0).    01/05/2019 Tumor Marker   Patient's tumor was tested for the following markers: CA125 Results of the tumor marker test revealed 929   02/02/2019 Tumor Marker   Patient's tumor was tested for the following markers: CA125 Results of the tumor marker test revealed 1198   02/09/2019 Imaging   1. New mild ascites. Mild peritoneal thickening shows no significant change, but remains consistent with peritoneal carcinomatosis. 2. Increased tiny bilateral pleural effusions. 3. Partial small bowel obstruction with transition point in the anterior lower abdomen, just deep to the anterior abdominal wall. 4. Stable small ventral abdominal wall hernia containing transverse colon.   02/17/2019 -  Chemotherapy   The patient had PACLitaxel for chemotherapy treatment.    03/05/2019 Tumor Marker   Patient's tumor was tested for the following markers: CA125 Results of the tumor marker test revealed 1243   03/11/2019 Imaging   CXR Small bilateral subpulmonic effusions with basilar  atelectasis. These are probably slightly larger than were seen in March.   03/30/2019 Tumor Marker   Patient's tumor was tested for the following markers: CA-125 Results of the tumor marker test revealed 776   05/01/2019 Tumor Marker   Patient's tumor was tested for the following markers: CA-125 Results of the tumor marker test revealed 180   05/19/2019 Tumor Marker   Patient's tumor was tested for the following markers: CA-125 Results of the tumor marker test revealed 163   05/29/2019 Imaging   1. Interval resolution of small volume ascites noted within the pelvis. Continued mild peritoneal thickening consistent with peritoneal carcinomatosis. 2. No new or progressive findings identified within the chest, abdomen or pelvis. 3. Partial small bowel obstruction described on 02/09/2019 is similar to previous exam. 4. Unchanged appearance of small pleural effusions, right greater than left. 5. Cystic lesion within neck of pancreas is identified, indeterminate measuring 1.7 cm. Increased from 1.3 cm previously. According to consensus criteria follow-up imaging in 6 months with repeat contrast enhanced pancreas protocol MRI or CT is advised. This recommendation follows ACR consensus guidelines: Management of Incidental Pancreatic Cysts: A White Paper of the ACR Incidental Findings Committee. J Am Coll Radiol 4580;99:833-825. 6. Tiny small nonspecific nodules within the right upper lobe are unchanged from previous exam.     06/12/2019 Tumor Marker   Patient's tumor was tested for the following markers: CA-125 Results of the tumor marker test revealed 148   07/09/2019 - 07/12/2019 Hospital Admission   She had recurrent admissions to the hospital for pneumonia   07/21/2019 Tumor Marker   Patient's tumor was  tested for the following markers: CA-125 Results of the tumor marker test revealed 442.   08/14/2019 Tumor Marker   Patient's tumor was tested for the following markers: CA-125 Results of the  tumor marker test revealed 910.     REVIEW OF SYSTEMS:   Constitutional: Denies fevers, chills or abnormal weight loss Eyes: Denies blurriness of vision Ears, nose, mouth, throat, and face: Denies mucositis or sore throat Respiratory: Denies cough, dyspnea or wheezes Cardiovascular: Denies palpitation, chest discomfort  Gastrointestinal:  Denies nausea, heartburn or change in bowel habits Skin: Denies abnormal skin rashes Lymphatics: Denies new lymphadenopathy or easy bruising All other systems were reviewed with the patient and are negative.  I have reviewed the past medical history, past surgical history, social history and family history with the patient and they are unchanged from previous note.  ALLERGIES:  is allergic to codeine.  MEDICATIONS:  Current Outpatient Medications  Medication Sig Dispense Refill  . albuterol (PROAIR HFA) 108 (90 Base) MCG/ACT inhaler Inhale 2 puffs into the lungs every 6 (six) hours as needed for wheezing or shortness of breath. 18 g 5  . ALPRAZolam (XANAX) 0.25 MG tablet Take 1 tablet (0.25 mg total) by mouth at bedtime as needed for anxiety. 5 tablet 0  . azelastine (ASTELIN) 0.1 % nasal spray Place 2 sprays into both nostrils 2 (two) times daily as needed for allergies.     . Cholecalciferol (VITAMIN D) 2000 units CAPS Take 2,000 Units by mouth daily.     Marland Kitchen dicyclomine (BENTYL) 10 MG capsule TAKE 1 CAPSULE(10 MG) BY MOUTH TWICE DAILY 60 capsule 0  . diltiazem (CARDIZEM CD) 180 MG 24 hr capsule Take 1 capsule (180 mg total) by mouth 2 (two) times a day. 180 capsule 3  . diphenhydrAMINE (BENADRYL) 25 mg capsule Take 25 mg by mouth every 8 (eight) hours as needed for itching.     . dronabinol (MARINOL) 2.5 MG capsule Take 1 capsule (2.5 mg total) by mouth 2 (two) times daily before a meal. 60 capsule 0  . DULoxetine (CYMBALTA) 60 MG capsule Take 1 capsule (60 mg total) by mouth at bedtime. 30 capsule 3  . fluticasone (FLONASE) 50 MCG/ACT nasal spray  Place 2 sprays into both nostrils 2 (two) times daily as needed (FOR NASAL CONGESTION.).     Marland Kitchen Fluticasone Furoate (ARNUITY ELLIPTA) 100 MCG/ACT AEPB Inhale 1 puff into the lungs daily. 2 each 0  . furosemide (LASIX) 40 MG tablet Take 1 tablet (40 mg total) by mouth daily. 90 tablet 3  . halobetasol (ULTRAVATE) 0.05 % cream Apply 1 application topically 2 (two) times daily as needed (psoriasis).     . hydrocortisone valerate cream (WESTCORT) 0.2 % Apply 1 application topically 2 (two) times daily. 45 g 0  . levothyroxine (SYNTHROID, LEVOTHROID) 137 MCG tablet Take 137 mcg by mouth daily before breakfast. For thyroid therapy    . lidocaine (LIDODERM) 5 % Place 1 patch onto the skin daily. Remove & Discard patch within 12 hours or as directed by MD 30 patch 0  . Magnesium 400 MG CAPS Take 400 mg by mouth daily.     . mirtazapine (REMERON) 15 MG tablet Take 1 tablet (15 mg total) by mouth at bedtime. 30 tablet 9  . Oxycodone HCl 10 MG TABS Take 1 tablet (10 mg total) by mouth every 4 (four) hours as needed. 90 tablet 0  . polyvinyl alcohol (LIQUIFILM TEARS) 1.4 % ophthalmic solution Place 1 drop into both eyes 2 (  two) times daily as needed for dry eyes.     . potassium chloride SA (K-DUR,KLOR-CON) 20 MEQ tablet TAKE 1 TABLET BY MOUTH DAILY AND TAKE 1 ADDITIONAL TABLET WHEN TAKING EXTRA LASIX DOSE (Patient taking differently: Take 20 mEq by mouth daily. ) 60 tablet 6  . prednisoLONE acetate (PRED FORTE) 1 % ophthalmic suspension PLACE 1 GTT INTO OU QID FOR 1 WEEK THEN BID FOR 1 WEEK    . Rivaroxaban (XARELTO) 15 MG TABS tablet Take 15 mg by mouth daily.    . Tiotropium Bromide-Olodaterol (STIOLTO RESPIMAT) 2.5-2.5 MCG/ACT AERS Inhale 2 puffs into the lungs daily. 4 g 11   No current facility-administered medications for this visit.    Facility-Administered Medications Ordered in Other Visits  Medication Dose Route Frequency Provider Last Rate Last Dose  . 0.9 %  sodium chloride infusion (Manually  program via Guardrails IV Fluids)  250 mL Intravenous Once Heath Lark, MD   Stopped at 12/16/18 1500    PHYSICAL EXAMINATION: ECOG PERFORMANCE STATUS: 3 - Symptomatic, >50% confined to bed  Vitals:   09/11/19 1050  BP: (!) 162/95  Pulse: (!) 105  Resp: 18  SpO2: 95%   Filed Weights   09/11/19 1050  Weight: 172 lb 1.6 oz (78.1 kg)    GENERAL:alert, she appears frail and debilitated, examined on wheelchair with oxygen in situ SKIN: skin color is pale, texture, turgor are normal, no rashes or significant lesions EYES: normal, Conjunctiva are pale and non-injected, sclera clear OROPHARYNX:no exudate, no erythema and lips, buccal mucosa, and tongue normal  NECK: supple, thyroid normal size, non-tender, without nodularity LYMPH:  no palpable lymphadenopathy in the cervical, axillary or inguinal LUNGS: Reduced breath sound on the right lung base with scattered bilateral wheeze  HEART: regular rate & rhythm and no murmurs with chronic stable bilateral lower extremity edema ABDOMEN:abdomen soft, non-tender and normal bowel sounds Musculoskeletal:no cyanosis of digits and no clubbing  NEURO: alert & oriented x 3 with fluent speech, no focal motor/sensory deficits  LABORATORY DATA:  I have reviewed the data as listed    Component Value Date/Time   NA 134 (L) 09/11/2019 1037   NA 135 (L) 09/24/2017 0945   K 4.5 09/11/2019 1037   K 3.7 09/24/2017 0945   CL 98 09/11/2019 1037   CO2 30 09/11/2019 1037   CO2 30 (H) 09/24/2017 0945   GLUCOSE 101 (H) 09/11/2019 1037   GLUCOSE 101 09/24/2017 0945   BUN 20 09/11/2019 1037   BUN 15.3 09/24/2017 0945   CREATININE 1.28 (H) 09/11/2019 1037   CREATININE 1.11 (H) 06/24/2019 0848   CREATININE 0.8 09/24/2017 0945   CALCIUM 7.9 (L) 09/11/2019 1037   CALCIUM 9.1 09/24/2017 0945   PROT 6.1 (L) 09/11/2019 1037   PROT 6.6 09/24/2017 0945   ALBUMIN 2.6 (L) 09/11/2019 1037   ALBUMIN 3.3 (L) 09/24/2017 0945   AST 23 09/11/2019 1037   AST 18  06/24/2019 0848   AST 16 09/24/2017 0945   ALT 11 09/11/2019 1037   ALT 9 06/24/2019 0848   ALT 12 09/24/2017 0945   ALKPHOS 57 09/11/2019 1037   ALKPHOS 52 09/24/2017 0945   BILITOT 0.4 09/11/2019 1037   BILITOT 0.4 06/24/2019 0848   BILITOT 1.13 09/24/2017 0945   GFRNONAA 38 (L) 09/11/2019 1037   GFRNONAA 46 (L) 06/24/2019 0848   GFRAA 44 (L) 09/11/2019 1037   GFRAA 53 (L) 06/24/2019 0848    No results found for: SPEP, UPEP  Lab Results  Component Value Date   WBC 14.8 (H) 09/11/2019   NEUTROABS 11.8 (H) 09/11/2019   HGB 9.4 (L) 09/11/2019   HCT 32.1 (L) 09/11/2019   MCV 79.3 (L) 09/11/2019   PLT 304 09/11/2019      Chemistry      Component Value Date/Time   NA 134 (L) 09/11/2019 1037   NA 135 (L) 09/24/2017 0945   K 4.5 09/11/2019 1037   K 3.7 09/24/2017 0945   CL 98 09/11/2019 1037   CO2 30 09/11/2019 1037   CO2 30 (H) 09/24/2017 0945   BUN 20 09/11/2019 1037   BUN 15.3 09/24/2017 0945   CREATININE 1.28 (H) 09/11/2019 1037   CREATININE 1.11 (H) 06/24/2019 0848   CREATININE 0.8 09/24/2017 0945      Component Value Date/Time   CALCIUM 7.9 (L) 09/11/2019 1037   CALCIUM 9.1 09/24/2017 0945   ALKPHOS 57 09/11/2019 1037   ALKPHOS 52 09/24/2017 0945   AST 23 09/11/2019 1037   AST 18 06/24/2019 0848   AST 16 09/24/2017 0945   ALT 11 09/11/2019 1037   ALT 9 06/24/2019 0848   ALT 12 09/24/2017 0945   BILITOT 0.4 09/11/2019 1037   BILITOT 0.4 06/24/2019 0848   BILITOT 1.13 09/24/2017 0945       All questions were answered. The patient knows to call the clinic with any problems, questions or concerns. No barriers to learning was detected.  I spent 25 minutes counseling the patient face to face. The total time spent in the appointment was 30 minutes and more than 50% was on counseling and review of test results  Heath Lark, MD 09/11/2019 11:26 AM

## 2019-09-11 NOTE — Assessment & Plan Note (Signed)
She is still quite weak with poor performance status I recommend we continue supportive care only I plan to see her again in about 2 weeks for further follow-up

## 2019-09-11 NOTE — Assessment & Plan Note (Signed)
The patient has profound debility and deconditioning She is not a candidate for chemotherapy I will continue to see her every 2 weeks for supportive care

## 2019-09-11 NOTE — Assessment & Plan Note (Signed)
She has COPD on home oxygen and recurrent pleural effusion due to malignancy She has appointment to follow with pulmonologist next week for further management

## 2019-09-14 ENCOUNTER — Telehealth: Payer: Self-pay | Admitting: Hematology and Oncology

## 2019-09-14 NOTE — Telephone Encounter (Signed)
Confirmed 12/18 appointments with dtr.

## 2019-09-16 ENCOUNTER — Telehealth: Payer: Self-pay | Admitting: Emergency Medicine

## 2019-09-16 DIAGNOSIS — J90 Pleural effusion, not elsewhere classified: Secondary | ICD-10-CM

## 2019-09-16 NOTE — Telephone Encounter (Signed)
Pt has an appt with RB 12/10 at 4:30  Called and spoke with pt's daughter [name] who stated pt's breathing has become worse within the last 2 weeks and she wonders if the fluid has become worse in the lung.  [name] also stated pt had bloodwork done at oncology office and she noticed that pt's white count was 14,000 so she wonders if pt might have pna.  Due to this, [name] is wanting to know if a cxr could be done before pt is seen.  Dr. Lamonte Sakai, please advise on this. Thanks!

## 2019-09-16 NOTE — Telephone Encounter (Signed)
Yes, agree with getting CXR prior to the visit

## 2019-09-17 ENCOUNTER — Ambulatory Visit (INDEPENDENT_AMBULATORY_CARE_PROVIDER_SITE_OTHER): Payer: Medicare Other | Admitting: Emergency Medicine

## 2019-09-17 ENCOUNTER — Encounter: Payer: Self-pay | Admitting: Emergency Medicine

## 2019-09-17 ENCOUNTER — Ambulatory Visit (INDEPENDENT_AMBULATORY_CARE_PROVIDER_SITE_OTHER): Payer: Medicare Other

## 2019-09-17 ENCOUNTER — Other Ambulatory Visit: Payer: Self-pay

## 2019-09-17 DIAGNOSIS — J90 Pleural effusion, not elsewhere classified: Secondary | ICD-10-CM | POA: Diagnosis not present

## 2019-09-17 MED ORDER — TRELEGY ELLIPTA 100-62.5-25 MCG/INH IN AEPB: 1.0000 | INHALATION_SPRAY | Freq: Every day | RESPIRATORY_TRACT | 0 refills | Status: DC

## 2019-09-17 NOTE — Assessment & Plan Note (Signed)
She has a malignant right pleural effusion and chest x-ray today shows that it is increasing in size, certainly a moderate effusion similar to that from October when she required thoracentesis.  She is more dyspneic and has had an overall functional decline.  It is difficult to say whether this is only from the effusion or whether it is related also to her underlying ovarian cancer which is not currently being treated.  I considered thoracentesis or even referral for Pleurx today, 1 complicating issue is that she recently had zoster and has significant right-sided neuropathic pain that would make any procedure difficult.  We agreed that we would wait on either thoracentesis or Pleurx, follow her clinically, follow her chest x-ray and see if her dyspnea correlates with any increase in pleural fluid.  If so we would need to reconsider drainage.

## 2019-09-17 NOTE — Telephone Encounter (Signed)
Spoke with pt's daughter, [name]. She is aware that RB would like the pt to have a CXR prior to her visit. Order has been placed. Nothing further was needed.

## 2019-09-17 NOTE — Assessment & Plan Note (Signed)
She seems to benefit from the addition of Arnuity to her Stiolto.  I would like to try changing her to Trelegy as I believe it would be more straightforward for her to take this 1 time a day with only 1 inhaler.  We will change her today and see how she tolerates, benefits.  She will keep albuterol available to use as needed.

## 2019-09-17 NOTE — Patient Instructions (Addendum)
Stop Stiolto and [place] for now. We will try starting Trelegy 1 inhalation once daily.  Rinse and gargle after using. Keep your albuterol available to use 2 puffs if needed for shortness of breath, chest tightness, wheezing. Your chest x-ray today shows some increase in your right sided pleural fluid.  There may be some benefit going forward to repeating thoracentesis or possibly even replacing a Pleurx catheter.  Is not entirely clear how much benefit can be had.  I recommend a repeat chest x-ray in 1 month to see if your degree of shortness of breath correlates with any increase in the fluid on chest x-ray.  If so then we can consider doing a procedure. Continue your oxygen at all times as you have been using it. Follow with Dr.Gorsuch as planned Follow with Dr Lamonte Sakai in 1 month with a CXR on the same day.

## 2019-09-17 NOTE — Addendum Note (Signed)
Addended by: Hildred Alamin I on: 09/17/2019 05:28 PM   Modules accepted: Orders

## 2019-09-17 NOTE — Progress Notes (Signed)
  Subjective:    Patient ID: [name], female    DOB: 1933/02/06, 85 y.o.   MRN: [id number] HPI       ROV 07/24/18 --85 year old woman with a history of COPD, psoriatic arthritis (previous methotrexate), chronic rhinitis and chronic cough, hypertension, atrial fibrillation with diastolic dysfunction.  She has ovarian cancer and is undergoing treatment.  She has a recurrent right pleural effusion (last cytology -05/08/2018).  A Pleurx catheter was placed by Dr. Servando Snare 06/24/2018, cytology again negative.  Draining it about every 3 days, last was 100cc. Her breathing is much improved.    ROV 09/17/2019 --85 year old woman with COPD, psoriatic arthritis (previous methotrexate), chronic cough. She has hypertension and atrial fibrillation with associated diastolic dysfunction.  Also with metastatic ovarian cancer complicated by pleural disease and right greater than left pleural effusions.  She had a right-sided Pleurx catheter, removed in March. She has been experiencing significant progression of her overall functional capacity, increased dyspnea, over the last several months. Not on any CA treatment for the last few months, too weak to tolerate, some renal insufficiency. She is on Stiolto + Arnuity. She uses albuterol 0-2x a day. CXR today shows an increase in size of her R effusion, probably at least moderate in size.     Objective:   Physical Exam Vitals:   09/17/19 1637  BP: 116/74  Pulse: 80  SpO2: 99%   Gen: Pleasant, overwt, in no distress,  normal affect on O2, wheelchair  ENT: No lesions,  mouth clear,  oropharynx clear, no postnasal drip  Neck: No JVD, no stridor  Lungs: No use of accessory muscles, no wheezes, decreased at R base  Cardiovascular: RRR, heart sounds normal, no murmur or gallops, no peripheral edema  Musculoskeletal: No deformities, no cyanosis or clubbing  Neuro: alert, non focal  Skin: Warm, no lesions or rashes   Assessment & Plan:  COPD (chronic  obstructive pulmonary disease) ([place]) She seems to benefit from the addition of Arnuity to her Stiolto.  I would like to try changing her to Trelegy as I believe it would be more straightforward for her to take this 1 time a day with only 1 inhaler.  We will change her today and see how she tolerates, benefits.  She will keep albuterol available to use as needed.  Pleural effusion She has a malignant right pleural effusion and chest x-ray today shows that it is increasing in size, certainly a moderate effusion similar to that from October when she required thoracentesis.  She is more dyspneic and has had an overall functional decline.  It is difficult to say whether this is only from the effusion or whether it is related also to her underlying ovarian cancer which is not currently being treated.  I considered thoracentesis or even referral for Pleurx today, 1 complicating issue is that she recently had zoster and has significant right-sided neuropathic pain that would make any procedure difficult.  We agreed that we would wait on either thoracentesis or Pleurx, follow her clinically, follow her chest x-ray and see if her dyspnea correlates with any increase in pleural fluid.  If so we would need to reconsider drainage.  Baltazar Apo, MD, PhD 09/17/2019, 5:22 PM [facility] [phone] or if no answer [phone]

## 2019-09-18 ENCOUNTER — Ambulatory Visit: Payer: Medicare Other | Admitting: Emergency Medicine

## 2019-09-25 ENCOUNTER — Inpatient Hospital Stay (HOSPITAL_BASED_OUTPATIENT_CLINIC_OR_DEPARTMENT_OTHER): Payer: Medicare Other | Admitting: Hematology and Oncology

## 2019-09-25 ENCOUNTER — Inpatient Hospital Stay: Payer: Medicare Other

## 2019-09-25 ENCOUNTER — Encounter: Payer: Self-pay | Admitting: Hematology and Oncology

## 2019-09-25 ENCOUNTER — Other Ambulatory Visit: Payer: Self-pay

## 2019-09-25 DIAGNOSIS — G893 Neoplasm related pain (acute) (chronic): Secondary | ICD-10-CM | POA: Diagnosis not present

## 2019-09-25 DIAGNOSIS — J449 Chronic obstructive pulmonary disease, unspecified: Secondary | ICD-10-CM

## 2019-09-25 DIAGNOSIS — D638 Anemia in other chronic diseases classified elsewhere: Secondary | ICD-10-CM

## 2019-09-25 DIAGNOSIS — C562 Malignant neoplasm of left ovary: Secondary | ICD-10-CM | POA: Diagnosis not present

## 2019-09-25 LAB — CBC WITH DIFFERENTIAL/PLATELET
Abs Immature Granulocytes: 0.07 10*3/uL (ref 0.00–0.07)
Basophils Absolute: 0.1 10*3/uL (ref 0.0–0.1)
Basophils Relative: 1 %
Eosinophils Absolute: 0.2 10*3/uL (ref 0.0–0.5)
Eosinophils Relative: 2 %
HCT: 32 % — ABNORMAL LOW (ref 36.0–46.0)
Hemoglobin: 9.3 g/dL — ABNORMAL LOW (ref 12.0–15.0)
Immature Granulocytes: 1 %
Lymphocytes Relative: 13 %
Lymphs Abs: 1.5 10*3/uL (ref 0.7–4.0)
MCH: 23.1 pg — ABNORMAL LOW (ref 26.0–34.0)
MCHC: 29.1 g/dL — ABNORMAL LOW (ref 30.0–36.0)
MCV: 79.6 fL — ABNORMAL LOW (ref 80.0–100.0)
Monocytes Absolute: 0.9 10*3/uL (ref 0.1–1.0)
Monocytes Relative: 8 %
Neutro Abs: 8.2 10*3/uL — ABNORMAL HIGH (ref 1.7–7.7)
Neutrophils Relative %: 75 %
Platelets: 338 10*3/uL (ref 150–400)
RBC: 4.02 MIL/uL (ref 3.87–5.11)
RDW: 19.9 % — ABNORMAL HIGH (ref 11.5–15.5)
WBC: 10.9 10*3/uL — ABNORMAL HIGH (ref 4.0–10.5)
nRBC: 0 % (ref 0.0–0.2)

## 2019-09-25 LAB — COMPREHENSIVE METABOLIC PANEL
ALT: 13 U/L (ref 0–44)
AST: 23 U/L (ref 15–41)
Albumin: 2.6 g/dL — ABNORMAL LOW (ref 3.5–5.0)
Alkaline Phosphatase: 55 U/L (ref 38–126)
Anion gap: 6 (ref 5–15)
BUN: 17 mg/dL (ref 8–23)
CO2: 31 mmol/L (ref 22–32)
Calcium: 8 mg/dL — ABNORMAL LOW (ref 8.9–10.3)
Chloride: 99 mmol/L (ref 98–111)
Creatinine, Ser: 1.14 mg/dL — ABNORMAL HIGH (ref 0.44–1.00)
GFR calc Af Amer: 51 mL/min — ABNORMAL LOW (ref >60)
GFR calc non Af Amer: 44 mL/min — ABNORMAL LOW (ref >60)
Glucose, Bld: 109 mg/dL — ABNORMAL HIGH (ref 70–99)
Potassium: 4.3 mmol/L (ref 3.5–5.1)
Sodium: 136 mmol/L (ref 135–145)
Total Bilirubin: 0.3 mg/dL (ref 0.3–1.2)
Total Protein: 6.4 g/dL — ABNORMAL LOW (ref 6.5–8.1)

## 2019-09-25 LAB — SAMPLE TO BLOOD BANK

## 2019-09-25 MED ORDER — SODIUM CHLORIDE 0.9% FLUSH
10.0000 mL | Freq: Once | INTRAVENOUS | Status: AC
  Administered 2019-09-25: 10 mL
  Filled 2019-09-25: qty 10

## 2019-09-25 NOTE — Assessment & Plan Note (Signed)
She has significant wheezing on exam today She does not have much pleural effusion on exam Her oxygen saturation is adequate I would defer to her pulmonologist for further management

## 2019-09-25 NOTE — Progress Notes (Signed)
[place] OFFICE PROGRESS NOTE  Patient Care Team: Asencion Noble, MD as PCP - General (Internal Medicine) Satira Sark, MD as PCP - Cardiology (Cardiology) Ahmed Prima, Fransisco Hertz, PA-C as Physician Assistant (Physician Assistant) Heath Lark, MD as Consulting Physician (Hematology and Oncology) Collene Gobble, MD as Consulting Physician (Pulmonary Disease)  ASSESSMENT & PLAN:  Left ovarian epithelial cancer [facility]) She is still quite weak with poor performance status I recommend we continue supportive care only I plan to see her again in about 4 weeks for further follow-up and supportive care only  Cancer associated pain She has multifactorial pain, exacerbated by recent postherpetic neuralgia She has poor tolerance to all sorts of pain medications including long-acting pain medicine, Lyrica and gabapentin She is only taking oxycodone as needed Continue supportive care  COPD (chronic obstructive pulmonary disease) ([place]) She has significant wheezing on exam today She does not have much pleural effusion on exam Her oxygen saturation is adequate I would defer to her pulmonologist for further management  Anemia, chronic disease Since discontinuation of chemotherapy, she has not needed transfusion support for over a month We will continue to monitor her blood counts carefully   No orders of the defined types were placed in this encounter.   INTERVAL HISTORY: Please see below for problem oriented charting. She returns with her daughter for further follow-up and supportive care I have reviewed documentation by pulmonologist She have audible wheezes Her recent oxygen prescription was increased to 3 L She have excessive fatigue and sleeping more than 12 hours a day No recent falls She is weak Her daughter did not report any recent confusion episode She still have severe neuropathic pain on the right rib area on the back from recent shingles She is taking oxycodone  and Cymbalta for this No recent constipation The patient denies any recent signs or symptoms of bleeding such as spontaneous epistaxis, hematuria or hematochezia.  SUMMARY OF ONCOLOGIC HISTORY: Oncology History Overview Note  High grade serous ER 90%, PR 0% BRCA 1: no loss of expression MMR normal  R1 resection, platinum refractory, progressed on Femara, carboplatin and Gemzar   Left ovarian epithelial cancer ([place])  02/18/2016 Tumor Marker   Patient's tumor was tested for the following markers: CA125 Results of the tumor marker test revealed 45   05/22/2016 Tumor Marker   Patient's tumor was tested for the following markers: CA125 Results of the tumor marker test revealed 53   05/22/2016 Imaging   Outside pelvic US showed 4.1 cm adnexa mass   06/24/2017 Imaging   Ct abdomen and pelvis:  1. Interim finding of moderate ascites within the abdomen and pelvis with additional finding of diffuse nodular infiltration of the omentum and anterior mesenteric fat, the appearance would be consistent with peritoneal carcinomatosis/metastatic disease. Increasing retroperitoneal and upper abdominal adenopathy. 2. Re- demonstrated 3.8 cm cyst in the right adnexa. Enlarging soft tissue density in the left adnexa now with possible cystic component posteriorly. In light of the above findings, concern is for ovarian neoplasm. Correlation with pelvic ultrasound recommended. 3. Small right-sided pleural effusion, new since prior study 4. Stable hypodense splenic lesions since 2017.    06/25/2017 Imaging   US pelvis: 2.9 cm simple appearing cyst in the right ovary. Left ovary grossly unremarkable. Large volume ascites in the pelvis   06/30/2017 - 07/01/2017 Hospital Admission   She was admitted for evaluation of abdominal pain and ascites   07/01/2017 Pathology Results   PERITONEAL/ASCITIC FLUID(SPECIMEN 1 OF 1  COLLECTED 07/01/17): - POORLY DIFFERENTIATED CARCINOMA; SEE COMMENT Source Peritoneal/Ascitic  Fluid, (specimen 1 of 1 collected 07/01/17) Gross Specimen: Received is/are 1000 cc's of brownish fluid. (BS:bs) Prepared: # Smears: 0 # Concentration Technique Slides (i.e. ThinPrep): 1 # Cell Block: 1 Additional Studies: Also received Hematology slide - [id number]. Comment The tumor cells are positive for cytokeratin 7 and Pax-8 but negative for cytokeratin 20, CDX-2, GATA-3, Napsin-A and TTF-1. Based on the immunoprofile a gynecology primary is favored   07/01/2017 Procedure   Successful ultrasound-guided diagnostic and therapeutic paracentesis yielding 2.5 liters of peritoneal fluid   07/07/2017 - 07/09/2017 Hospital Admission   She was admitted for management of malignant ascites   07/08/2017 Procedure   Successful ultrasound-guided therapeutic paracentesis yielding 2.7 liters liters of peritoneal fluid   07/12/2017 Procedure   Successful ultrasound-guided paracentesis yielding 1450 mL of peritoneal fluid   07/18/2017 - 07/24/2017 Hospital Admission   She was admitted for expedited treatment   07/18/2017 Tumor Marker   Patient's tumor was tested for the following markers: CA125 Results of the tumor marker test revealed 1941   07/19/2017 - 02/04/2018 Chemotherapy   The patient had 6 cycles of carboplatin & Taxol for chemotherapy treatment, followed by 3 more cycles of carboplatin only    07/19/2017 - 02/04/2018 Chemotherapy   The patient had carboplatin and taxol   08/06/2017 Procedure   Successful ultrasound-guided therapeutic paracentesis yielding 2.6 liters of peritoneal fluid.   08/09/2017 Tumor Marker   Patient's tumor was tested for the following markers: CA125 Results of the tumor marker test revealed 1665   08/15/2017 Tumor Marker   Patient's tumor was tested for the following markers: CA125 Results of the tumor marker test revealed 937.9   08/20/2017 Imaging   ECHO: Normal LV size with EF 60-65%. Normal RV size and systolic function. No significant valvular  abnormalities.   09/18/2017 Imaging   Chest Impression:  1. No evidence thoracic metastasis. 2. Interval increase and RIGHT pleural effusion.  Abdomen / Pelvis Impression:  1. Interval decrease in intraperitoneal free fluid. 2. Interval decrease in omental nodularity in the LEFT ventral peritoneal space. 3. Interval decrease in nodularity associated with the LEFT ovary. 4. Cystic portion of the RIGHT ovary is increased mildly in size.   09/20/2017 Tumor Marker   Patient's tumor was tested for the following markers: CA125 Results of the tumor marker test revealed 347   10/14/2017 Tumor Marker   Patient's tumor was tested for the following markers: CA125 Results of the tumor marker test revealed 307.4   11/04/2017 Tumor Marker   Patient's tumor was tested for the following markers: CA125 Results of the tumor marker test revealed 262.5   11/28/2017 Imaging   1. Interval decrease in right pleural effusion with resolution of right atelectasis seen previously. 2. New small left pleural effusion, symmetric to the right. 3. No intraperitoneal free fluid on the current study. 4. Continued further decrease in left omental disease, appearing less confluent today than on the prior study. 5. Left ovary remains normal in appearance today and the right adnexal cystic lesion is decreased in size compared to prior study. 6. 14 mm pancreatic cyst is unchanged. Continued attention on follow-up imaging recommended. 7. Aortic Atherosclerois (ICD10-170.0)   12/13/2017 Tumor Marker   Patient's tumor was tested for the following markers: CA125 Results of the tumor marker test revealed 197.7   01/03/2018 Tumor Marker   Patient's tumor was tested for the following markers: CA125 Results of the tumor marker test revealed  183.1   01/14/2018 Tumor Marker   Patient's tumor was tested for the following markers: CA125 Results of the tumor marker test revealed 177.4   02/04/2018 Tumor Marker   Patient's tumor  was tested for the following markers: CA125 Results of the tumor marker test revealed 168.5   02/25/2018 Imaging   1. Omental carcinomatosis appears qualitatively stable to slightly decreased. Stable mild peritoneal thickening in the paracolic gutters. Stable right adnexal cyst. No ascites. No new or progressive metastatic disease in the abdomen or pelvis. 2. Small dependent right pleural effusion is increased. 3. Cystic pancreatic body lesion is decreased and now subcentimeter in size, suggesting a benign lesion. 4. Aortic Atherosclerosis (ICD10-I70.0).   03/03/2018 - 03/07/2018 Hospital Admission   She was hospitalized for GI bleed requiring blood transfusions. Xarelto was placed on hold   03/07/2018 PET scan   1. Persistent hazy omental interstitial nodularity but no hypermetabolism or discrete measurable nodules. No abdominal ascites. 2. No findings for metastatic disease involving the chest. 3. Moderate-sized right pleural effusion and small left pleural effusion.    03/20/2018 Pathology Results   1. Ovary and fallopian tube, right - OVARY AND FALLOPIAN TUBE INVOLVED BY SEROUS CARCINOMA. - PARATUBAL CYST. 2. Uterus +/- tubes/ovaries, neoplastic, cervix, left ovary and fallopian tube - LEFT OVARY: HIGH GRADE SEROUS CARCINOMA WITH TREATMENT EFFECT, SPANNING 2.5 CM. CARCINOMA INVOLVES OVARIAN SURFACE. SEE ONCOLOGY TABLE. - LEFT FALLOPIAN TUBE: INVOLVED BY SEROUS CARCINOMA. - UTERUS: -ENDOMETRIUM: INACTIVE ENDOMETRIUM. NO HYPERPLASIA OR MALIGNANCY. -MYOMETRIUM: UNREMARKABLE. NO MALIGNANCY. -SEROSA: INVOLVED BY SEROUS CARCINOMA. - CERVIX: ENDOCERVICAL POLYP. NO MALIGNANCY. 3. Omentum, resection for tumor - INVOLVED BY SEROUS CARCINOMA. 4. Soft tissue, biopsy, mesenteric nodule - INVOLVED BY SEROUS CARCINOMA. Microscopic Comment 2. OVARY or FALLOPIAN TUBE or PRIMARY PERITONEUM: Procedure: Total hysterectomy and bilateral salpingo-oophorectomy. Omentectomy. Mesenteric lymph node  biopsy. Specimen Integrity: Intact. Tumor Site: Left ovary. Ovarian Surface Involvement (required only if applicable): Present. Fallopian Tube Surface Involvement (required only if applicable): Present, bilateral. Tumor Size: 2.5 cm. Histologic Type: High grade serous carcinoma. Histologic Grade: High grade. Implants (required for advanced stage serous/seromucinous borderline tumors only): N/A. Other Tissue/ Organ Involvement: Bilateral fallopian tubes, right ovary, uterine serosa, omentum. Largest Extrapelvic Peritoneal Focus (required only if applicable): Microscopic, estimated 0.5 cm (omentum). Peritoneal/Ascitic Fluid: Prior Positive ([id number]). Treatment Effect (required only for high-grade serous carcinomas): Present in left ovary. CRS2. Regional Lymph Nodes: No lymph nodes submitted/identified. Pathologic Stage Classification (pTNM, AJCC 8th Edition): ypT3b, ypNX Representative Tumor Block: 1A, 1B, 57F, 11F. Comment(s): The right ovary has only surface deposits with a large paratubal cyst. The left ovary has intraparenchymal tumor with associated treatment effect. Thus the tumor location is classified as a left ovarian primary.   03/20/2018 Surgery   Procedure(s) Performed:  1. Exploratory laparotomy with total hysterectomy and bilateral salpingo-oophorectomy 2. Infragastic Omentectomy  3. Debulking to <1cm gross residual disease   Surgeon: Mart Piggs, MD  Specimens: Uterus Cervix, Bilateral tubes / ovaries and omentum. Mesenteric nodule.  Operative Findings: Debulked to gross residual disease <1cm; however there is miliary disease in multiple locations including the majority of the abdominal peritoneum (anterior abdominal wall, bilateral gutters), diaphragm (Right>left), majority of small bowel mesentary. Normal appendix. Normal small uterus. Right ovary with a cystic lesion ~3cm, some adhesive disease of right adnexa to rectum/sigmoid. Gross omental disease, which  was resected with the omentectomy. Smooth liver surface, but again, diaphragmatic disease noted.      03/20/2018 Genetic Testing   Patient has genetic  testing done for ER/PR. Results revealed patient has ER: 90%, PR 0%.    03/31/2018 Tumor Marker   Patient's tumor was tested for the following markers: CA125 Results of the tumor marker test revealed 215.8    Genetic Testing   Patient has genetic testing done for BRCA 1. Results revealed patient has the following: BRCA 1: no loss of expression.    Genetic Testing   Patient has genetic testing done for MMR . Results revealed patient has the following:  MMR: normal   03/31/2018 Genetic Testing   Patient has genetic testing done for BRCA1/2. Results revealed patient has no actionable mutations. She is found to have [place] genetic change of unknown significance   04/21/2018 Imaging   1. No definite findings of residual or recurrent metastatic disease in the abdomen or pelvis status post interval TAHBSO and omentectomy. Stable minimal thickening in the paracolic gutters without discrete peritoneal nodularity. 2. Trace free fluid in the pelvic cul-de-sac. 3. Stable small dependent right pleural effusion. 4. Subcentimeter pancreatic body cystic lesion is stable to slightly decreased. 5. Aortic Atherosclerosis (ICD10-I70.0).   04/21/2018 Tumor Marker   Patient's tumor was tested for the following markers: CA125 Results of the tumor marker test revealed 187.3   04/23/2018 - 09/12/2018 Anti-estrogen oral therapy   She is placed on Femara   05/08/2018 Procedure   Successful ultrasound guided right thoracentesis yielding 800 mL of pleural fluid. Fluid cytology is negative for malignancy    05/28/2018 Tumor Marker   Patient's tumor was tested for the following markers: CA125 Results of the tumor marker test revealed 190   06/30/2018 Tumor Marker   Patient's tumor was tested for the following markers: CA125 Results of the tumor marker test  revealed 148   07/23/2018 Imaging   Status post hysterectomy and bilateral salpingo-oophorectomy.  Very mild peritoneal thickening/nodularity, equivocal but worrisome for very mild peritoneal disease. Attention on follow-up is suggested.  Small right pleural effusion with indwelling pleural drain.   07/23/2018 Tumor Marker   Patient's tumor was tested for the following markers: CA125 Results of the tumor marker test revealed 215   09/11/2018 Tumor Marker   Patient's tumor was tested for the following markers: CA-125. Results of the tumor marker test revealed 1323   09/12/2018 Imaging   1. Mildly progressive ascites with some mild degree of nodularity most appreciable along the left adnexa, right paracolic gutter, right upper quadrant, compatible with peritoneal spread of tumor. 2. Small right pleural effusion with indwelling right pleural catheter again noted. Mild increase in atelectasis anteriorly at the right lung base. 3. Aortic Atherosclerosis (ICD10-I70.0). Coronary atherosclerosis. 4. Several tiny hypodense lesions in the spleen are technically nonspecific, but stable. 5. Air fluid level in the rectum compatible with diarrheal process. 6. Prominent bilateral hip arthropathy. Left hip screw noted.   09/22/2018 - 02/02/2019 Chemotherapy   The patient had carboplatin and gemzar   09/29/2018 Tumor Marker   Patient's tumor was tested for the following markers: CA125 Results of the tumor marker test revealed 1219   09/29/2018 Adverse Reaction   Cycle 1 day 8 of Gemzar was omitted due to severe anemia   10/20/2018 Tumor Marker   Patient's tumor was tested for the following markers: CA125 Results of the tumor marker test revealed 402   11/13/2018 Tumor Marker   Patient's tumor was tested for the following markers: CA125 Results of the tumor marker test revealed 437   11/26/2018 Imaging   1. Mild residual peritoneal thickening, overall decreased  from 09/11/2018, with resolution  of previously seen ascites. 2. Tiny residual right pleural effusion with small bore chest tube in place. 3. Supraumbilical midline ventral hernia has a wide neck and contains unobstructed colon. 4.  Aortic atherosclerosis (ICD10-170.0).    01/05/2019 Tumor Marker   Patient's tumor was tested for the following markers: CA125 Results of the tumor marker test revealed 929   02/02/2019 Tumor Marker   Patient's tumor was tested for the following markers: CA125 Results of the tumor marker test revealed 1198   02/09/2019 Imaging   1. New mild ascites. Mild peritoneal thickening shows no significant change, but remains consistent with peritoneal carcinomatosis. 2. Increased tiny bilateral pleural effusions. 3. Partial small bowel obstruction with transition point in the anterior lower abdomen, just deep to the anterior abdominal wall. 4. Stable small ventral abdominal wall hernia containing transverse colon.   02/17/2019 -  Chemotherapy   The patient had PACLitaxel for chemotherapy treatment.    03/05/2019 Tumor Marker   Patient's tumor was tested for the following markers: CA125 Results of the tumor marker test revealed 1243   03/11/2019 Imaging   CXR Small bilateral subpulmonic effusions with basilar atelectasis. These are probably slightly larger than were seen in March.   03/30/2019 Tumor Marker   Patient's tumor was tested for the following markers: CA-125 Results of the tumor marker test revealed 776   05/01/2019 Tumor Marker   Patient's tumor was tested for the following markers: CA-125 Results of the tumor marker test revealed 180   05/19/2019 Tumor Marker   Patient's tumor was tested for the following markers: CA-125 Results of the tumor marker test revealed 163   05/29/2019 Imaging   1. Interval resolution of small volume ascites noted within the pelvis. Continued mild peritoneal thickening consistent with peritoneal carcinomatosis. 2. No new or progressive findings identified  within the chest, abdomen or pelvis. 3. Partial small bowel obstruction described on 02/09/2019 is similar to previous exam. 4. Unchanged appearance of small pleural effusions, right greater than left. 5. Cystic lesion within neck of pancreas is identified, indeterminate measuring 1.7 cm. Increased from 1.3 cm previously. According to consensus criteria follow-up imaging in 6 months with repeat contrast enhanced pancreas protocol MRI or CT is advised. This recommendation follows ACR consensus guidelines: Management of Incidental Pancreatic Cysts: A White Paper of the ACR Incidental Findings Committee. J Am Coll Radiol 2263;33:545-625. 6. Tiny small nonspecific nodules within the right upper lobe are unchanged from previous exam.     06/12/2019 Tumor Marker   Patient's tumor was tested for the following markers: CA-125 Results of the tumor marker test revealed 148   07/09/2019 - 07/12/2019 Hospital Admission   She had recurrent admissions to the hospital for pneumonia   07/21/2019 Tumor Marker   Patient's tumor was tested for the following markers: CA-125 Results of the tumor marker test revealed 442.   08/14/2019 Tumor Marker   Patient's tumor was tested for the following markers: CA-125 Results of the tumor marker test revealed 910.     REVIEW OF SYSTEMS:   Constitutional: Denies fevers, chills or abnormal weight loss Eyes: Denies blurriness of vision Ears, nose, mouth, throat, and face: Denies mucositis or sore throat Cardiovascular: Denies palpitation, chest discomfort or lower extremity swelling Gastrointestinal:  Denies nausea, heartburn or change in bowel habits Skin: Denies abnormal skin rashes Lymphatics: Denies new lymphadenopathy or easy bruising Behavioral/Psych: Mood is stable, no new changes  All other systems were reviewed with the patient and are negative.  I have reviewed the past medical history, past surgical history, social history and family history with the patient  and they are unchanged from previous note.  ALLERGIES:  is allergic to codeine.  MEDICATIONS:  Current Outpatient Medications  Medication Sig Dispense Refill  . albuterol (PROAIR HFA) 108 (90 Base) MCG/ACT inhaler Inhale 2 puffs into the lungs every 6 (six) hours as needed for wheezing or shortness of breath. 18 g 5  . ALPRAZolam (XANAX) 0.25 MG tablet Take 1 tablet (0.25 mg total) by mouth at bedtime as needed for anxiety. 5 tablet 0  . azelastine (ASTELIN) 0.1 % nasal spray Place 2 sprays into both nostrils 2 (two) times daily as needed for allergies.     . Cholecalciferol (VITAMIN D) 2000 units CAPS Take 2,000 Units by mouth daily.     Marland Kitchen dicyclomine (BENTYL) 10 MG capsule TAKE 1 CAPSULE(10 MG) BY MOUTH TWICE DAILY 60 capsule 0  . diltiazem (CARDIZEM CD) 180 MG 24 hr capsule Take 1 capsule (180 mg total) by mouth 2 (two) times a day. 180 capsule 3  . diphenhydrAMINE (BENADRYL) 25 mg capsule Take 25 mg by mouth every 8 (eight) hours as needed for itching.     . dronabinol (MARINOL) 2.5 MG capsule Take 1 capsule (2.5 mg total) by mouth 2 (two) times daily before a meal. 60 capsule 0  . DULoxetine (CYMBALTA) 60 MG capsule Take 1 capsule (60 mg total) by mouth at bedtime. 30 capsule 3  . fluticasone (FLONASE) 50 MCG/ACT nasal spray Place 2 sprays into both nostrils 2 (two) times daily as needed (FOR NASAL CONGESTION.).     Marland Kitchen Fluticasone Furoate (ARNUITY ELLIPTA) 100 MCG/ACT AEPB Inhale 1 puff into the lungs daily. 2 each 0  . Fluticasone-Umeclidin-Vilant (TRELEGY ELLIPTA) 100-62.5-25 MCG/INH AEPB Inhale 1 puff into the lungs daily. 28 each 0  . furosemide (LASIX) 40 MG tablet Take 1 tablet (40 mg total) by mouth daily. 90 tablet 3  . halobetasol (ULTRAVATE) 0.05 % cream Apply 1 application topically 2 (two) times daily as needed (psoriasis).     . hydrocortisone valerate cream (WESTCORT) 0.2 % Apply 1 application topically 2 (two) times daily. 45 g 0  . levothyroxine (SYNTHROID, LEVOTHROID) 137  MCG tablet Take 137 mcg by mouth daily before breakfast. For thyroid therapy    . lidocaine (LIDODERM) 5 % Place 1 patch onto the skin daily. Remove & Discard patch within 12 hours or as directed by MD 30 patch 0  . Magnesium 400 MG CAPS Take 400 mg by mouth daily.     . Oxycodone HCl 10 MG TABS Take 1 tablet (10 mg total) by mouth every 4 (four) hours as needed. 90 tablet 0  . polyvinyl alcohol (LIQUIFILM TEARS) 1.4 % ophthalmic solution Place 1 drop into both eyes 2 (two) times daily as needed for dry eyes.     . potassium chloride SA (K-DUR,KLOR-CON) 20 MEQ tablet TAKE 1 TABLET BY MOUTH DAILY AND TAKE 1 ADDITIONAL TABLET WHEN TAKING EXTRA LASIX DOSE (Patient taking differently: Take 20 mEq by mouth daily. ) 60 tablet 6  . prednisoLONE acetate (PRED FORTE) 1 % ophthalmic suspension PLACE 1 GTT INTO OU QID FOR 1 WEEK THEN BID FOR 1 WEEK    . Rivaroxaban (XARELTO) 15 MG TABS tablet Take 15 mg by mouth daily.     No current facility-administered medications for this visit.   Facility-Administered Medications Ordered in Other Visits  Medication Dose Route Frequency Provider Last Rate Last Admin  .  0.9 %  sodium chloride infusion (Manually program via Guardrails IV Fluids)  250 mL Intravenous Once Heath Lark, MD   Stopped at 12/16/18 1500    PHYSICAL EXAMINATION: ECOG PERFORMANCE STATUS: 3 - Symptomatic, >50% confined to bed  Vitals:   09/25/19 1318  BP: 138/64  Pulse: 86  Resp: 18  Temp: 98.5 F (36.9 C)  SpO2: 100%   Filed Weights   09/25/19 1318  Weight: 171 lb 3.2 oz (77.7 kg)    GENERAL:alert, no distress and comfortable.  She is examined sitting on the wheelchair with oxygen in situ.  Expiratory wheezes noted. SKIN: skin color, texture, turgor are normal, no rashes or significant lesions EYES: normal, Conjunctiva are pink and non-injected, sclera clear OROPHARYNX:no exudate, no erythema and lips, buccal mucosa, and tongue normal  NECK: supple, thyroid normal size, non-tender,  without nodularity LYMPH:  no palpable lymphadenopathy in the cervical, axillary or inguinal LUNGS: Increased breathing effort with expiratory wheezes throughout HEART: regular rate & rhythm and no murmurs and no lower extremity edema ABDOMEN:abdomen soft, non-tender and normal bowel sounds Musculoskeletal:no cyanosis of digits and no clubbing  NEURO: alert & oriented x 3 with fluent speech, no focal motor/sensory deficits  LABORATORY DATA:  I have reviewed the data as listed    Component Value Date/Time   NA 136 09/25/2019 1315   NA 135 (L) 09/24/2017 0945   K 4.3 09/25/2019 1315   K 3.7 09/24/2017 0945   CL 99 09/25/2019 1315   CO2 31 09/25/2019 1315   CO2 30 (H) 09/24/2017 0945   GLUCOSE 109 (H) 09/25/2019 1315   GLUCOSE 101 09/24/2017 0945   BUN 17 09/25/2019 1315   BUN 15.3 09/24/2017 0945   CREATININE 1.14 (H) 09/25/2019 1315   CREATININE 1.11 (H) 06/24/2019 0848   CREATININE 0.8 09/24/2017 0945   CALCIUM 8.0 (L) 09/25/2019 1315   CALCIUM 9.1 09/24/2017 0945   PROT 6.4 (L) 09/25/2019 1315   PROT 6.6 09/24/2017 0945   ALBUMIN 2.6 (L) 09/25/2019 1315   ALBUMIN 3.3 (L) 09/24/2017 0945   AST 23 09/25/2019 1315   AST 18 06/24/2019 0848   AST 16 09/24/2017 0945   ALT 13 09/25/2019 1315   ALT 9 06/24/2019 0848   ALT 12 09/24/2017 0945   ALKPHOS 55 09/25/2019 1315   ALKPHOS 52 09/24/2017 0945   BILITOT 0.3 09/25/2019 1315   BILITOT 0.4 06/24/2019 0848   BILITOT 1.13 09/24/2017 0945   GFRNONAA 44 (L) 09/25/2019 1315   GFRNONAA 46 (L) 06/24/2019 0848   GFRAA 51 (L) 09/25/2019 1315   GFRAA 53 (L) 06/24/2019 0848    No results found for: SPEP, UPEP  Lab Results  Component Value Date   WBC 10.9 (H) 09/25/2019   NEUTROABS 8.2 (H) 09/25/2019   HGB 9.3 (L) 09/25/2019   HCT 32.0 (L) 09/25/2019   MCV 79.6 (L) 09/25/2019   PLT 338 09/25/2019      Chemistry      Component Value Date/Time   NA 136 09/25/2019 1315   NA 135 (L) 09/24/2017 0945   K 4.3 09/25/2019 1315    K 3.7 09/24/2017 0945   CL 99 09/25/2019 1315   CO2 31 09/25/2019 1315   CO2 30 (H) 09/24/2017 0945   BUN 17 09/25/2019 1315   BUN 15.3 09/24/2017 0945   CREATININE 1.14 (H) 09/25/2019 1315   CREATININE 1.11 (H) 06/24/2019 0848   CREATININE 0.8 09/24/2017 0945      Component Value Date/Time   CALCIUM  8.0 (L) 09/25/2019 1315   CALCIUM 9.1 09/24/2017 0945   ALKPHOS 55 09/25/2019 1315   ALKPHOS 52 09/24/2017 0945   AST 23 09/25/2019 1315   AST 18 06/24/2019 0848   AST 16 09/24/2017 0945   ALT 13 09/25/2019 1315   ALT 9 06/24/2019 0848   ALT 12 09/24/2017 0945   BILITOT 0.3 09/25/2019 1315   BILITOT 0.4 06/24/2019 0848   BILITOT 1.13 09/24/2017 0945       RADIOGRAPHIC STUDIES: I have personally reviewed the radiological images as listed and agreed with the findings in the report. DG Chest 2 View  Result Date: 09/17/2019 CLINICAL DATA:  Shortness of breath EXAM: CHEST - 2 VIEW COMPARISON:  07/30/2019 FINDINGS: RIGHT jugular Port-A-Cath with tip projecting over cavoatrial junction. Normal heart size, mediastinal contours, and pulmonary vascularity. Atherosclerotic calcification aorta. Pleural effusion and subsegmental atelectasis at RIGHT base. Tiny pleural effusion and minimal LEFT basilar atelectasis. Underlying emphysematous changes. No acute infiltrate or pneumothorax. Bones demineralized. IMPRESSION: Emphysematous changes with bibasilar effusions and atelectasis greater on RIGHT. Aortic Atherosclerosis (ICD10-I70.0). Electronically Signed   By: Lavonia Dana M.D.   On: 09/17/2019 16:24    All questions were answered. The patient knows to call the clinic with any problems, questions or concerns. No barriers to learning was detected.  I spent 15 minutes counseling the patient face to face. The total time spent in the appointment was 20 minutes and more than 50% was on counseling and review of test results  Heath Lark, MD 09/25/2019 2:12 PM

## 2019-09-25 NOTE — Assessment & Plan Note (Signed)
Since discontinuation of chemotherapy, she has not needed transfusion support for over a month We will continue to monitor her blood counts carefully

## 2019-09-25 NOTE — Assessment & Plan Note (Signed)
She is still quite weak with poor performance status I recommend we continue supportive care only I plan to see her again in about 4 weeks for further follow-up and supportive care only

## 2019-09-25 NOTE — Assessment & Plan Note (Signed)
She has multifactorial pain, exacerbated by recent postherpetic neuralgia She has poor tolerance to all sorts of pain medications including long-acting pain medicine, Lyrica and gabapentin She is only taking oxycodone as needed Continue supportive care

## 2019-09-29 ENCOUNTER — Telehealth: Payer: Self-pay | Admitting: Hematology and Oncology

## 2019-09-29 NOTE — Telephone Encounter (Signed)
Scheduled appt per 12/18 sch message - pt daughter is aware of apt date and time

## 2019-09-30 ENCOUNTER — Telehealth: Payer: Self-pay | Admitting: *Deleted

## 2019-09-30 NOTE — Telephone Encounter (Signed)
Pt daughter called stating that her mother, Ms. Kneisley was having extreme nausea without vomiting. Returned pt call, pt daughter stated that her mother's nausea has subsided. She didn't take her Marinol as prescribed. Stated that she feels better but nausea has been worse lately. Advised daughter to remind pt to continue to take prescribed meds as directed and if there were any other concerns to call office. Pt daughter verbalized understanding

## 2019-10-04 ENCOUNTER — Other Ambulatory Visit: Payer: Self-pay

## 2019-10-04 ENCOUNTER — Encounter (HOSPITAL_COMMUNITY): Payer: Self-pay | Admitting: Emergency Medicine

## 2019-10-04 ENCOUNTER — Inpatient Hospital Stay (HOSPITAL_COMMUNITY)
Admission: EM | Admit: 2019-10-04 | Discharge: 2019-10-08 | DRG: 378 | Disposition: A | Discharge status: Hospice | Payer: Medicare Other | Attending: Family Medicine | Admitting: Family Medicine

## 2019-10-04 DIAGNOSIS — K625 Hemorrhage of anus and rectum: Secondary | ICD-10-CM

## 2019-10-04 DIAGNOSIS — D638 Anemia in other chronic diseases classified elsewhere: Secondary | ICD-10-CM | POA: Diagnosis present

## 2019-10-04 DIAGNOSIS — R110 Nausea: Secondary | ICD-10-CM | POA: Diagnosis not present

## 2019-10-04 DIAGNOSIS — I5032 Chronic diastolic (congestive) heart failure: Secondary | ICD-10-CM | POA: Diagnosis present

## 2019-10-04 DIAGNOSIS — Z87891 Personal history of nicotine dependence: Secondary | ICD-10-CM

## 2019-10-04 DIAGNOSIS — N183 Chronic kidney disease, stage 3 unspecified: Secondary | ICD-10-CM | POA: Diagnosis present

## 2019-10-04 DIAGNOSIS — Z515 Encounter for palliative care: Secondary | ICD-10-CM

## 2019-10-04 DIAGNOSIS — K5731 Diverticulosis of large intestine without perforation or abscess with bleeding: Principal | ICD-10-CM | POA: Diagnosis present

## 2019-10-04 DIAGNOSIS — Z801 Family history of malignant neoplasm of trachea, bronchus and lung: Secondary | ICD-10-CM

## 2019-10-04 DIAGNOSIS — K219 Gastro-esophageal reflux disease without esophagitis: Secondary | ICD-10-CM | POA: Diagnosis present

## 2019-10-04 DIAGNOSIS — Z79890 Hormone replacement therapy (postmenopausal): Secondary | ICD-10-CM

## 2019-10-04 DIAGNOSIS — I482 Chronic atrial fibrillation, unspecified: Secondary | ICD-10-CM | POA: Diagnosis not present

## 2019-10-04 DIAGNOSIS — L4050 Arthropathic psoriasis, unspecified: Secondary | ICD-10-CM | POA: Diagnosis present

## 2019-10-04 DIAGNOSIS — F419 Anxiety disorder, unspecified: Secondary | ICD-10-CM | POA: Diagnosis present

## 2019-10-04 DIAGNOSIS — Z9981 Dependence on supplemental oxygen: Secondary | ICD-10-CM

## 2019-10-04 DIAGNOSIS — Z7901 Long term (current) use of anticoagulants: Secondary | ICD-10-CM

## 2019-10-04 DIAGNOSIS — J9611 Chronic respiratory failure with hypoxia: Secondary | ICD-10-CM | POA: Diagnosis not present

## 2019-10-04 DIAGNOSIS — D500 Iron deficiency anemia secondary to blood loss (chronic): Secondary | ICD-10-CM

## 2019-10-04 DIAGNOSIS — B0229 Other postherpetic nervous system involvement: Secondary | ICD-10-CM | POA: Diagnosis present

## 2019-10-04 DIAGNOSIS — K922 Gastrointestinal hemorrhage, unspecified: Secondary | ICD-10-CM | POA: Diagnosis not present

## 2019-10-04 DIAGNOSIS — Z8619 Personal history of other infectious and parasitic diseases: Secondary | ICD-10-CM

## 2019-10-04 DIAGNOSIS — F329 Major depressive disorder, single episode, unspecified: Secondary | ICD-10-CM | POA: Diagnosis present

## 2019-10-04 DIAGNOSIS — D62 Acute posthemorrhagic anemia: Secondary | ICD-10-CM | POA: Diagnosis present

## 2019-10-04 DIAGNOSIS — R791 Abnormal coagulation profile: Secondary | ICD-10-CM | POA: Diagnosis present

## 2019-10-04 DIAGNOSIS — Z20828 Contact with and (suspected) exposure to other viral communicable diseases: Secondary | ICD-10-CM | POA: Diagnosis present

## 2019-10-04 DIAGNOSIS — R188 Other ascites: Secondary | ICD-10-CM

## 2019-10-04 DIAGNOSIS — Z8249 Family history of ischemic heart disease and other diseases of the circulatory system: Secondary | ICD-10-CM

## 2019-10-04 DIAGNOSIS — J449 Chronic obstructive pulmonary disease, unspecified: Secondary | ICD-10-CM | POA: Diagnosis present

## 2019-10-04 DIAGNOSIS — Z66 Do not resuscitate: Secondary | ICD-10-CM | POA: Diagnosis not present

## 2019-10-04 DIAGNOSIS — C562 Malignant neoplasm of left ovary: Secondary | ICD-10-CM | POA: Diagnosis present

## 2019-10-04 DIAGNOSIS — M810 Age-related osteoporosis without current pathological fracture: Secondary | ICD-10-CM | POA: Diagnosis present

## 2019-10-04 DIAGNOSIS — Z90722 Acquired absence of ovaries, bilateral: Secondary | ICD-10-CM

## 2019-10-04 DIAGNOSIS — M797 Fibromyalgia: Secondary | ICD-10-CM | POA: Diagnosis present

## 2019-10-04 DIAGNOSIS — Z79899 Other long term (current) drug therapy: Secondary | ICD-10-CM

## 2019-10-04 DIAGNOSIS — Z800 Family history of malignant neoplasm of digestive organs: Secondary | ICD-10-CM

## 2019-10-04 DIAGNOSIS — R5381 Other malaise: Secondary | ICD-10-CM | POA: Diagnosis present

## 2019-10-04 DIAGNOSIS — J302 Other seasonal allergic rhinitis: Secondary | ICD-10-CM | POA: Diagnosis present

## 2019-10-04 DIAGNOSIS — Z9221 Personal history of antineoplastic chemotherapy: Secondary | ICD-10-CM

## 2019-10-04 DIAGNOSIS — Z96652 Presence of left artificial knee joint: Secondary | ICD-10-CM | POA: Diagnosis present

## 2019-10-04 DIAGNOSIS — K589 Irritable bowel syndrome without diarrhea: Secondary | ICD-10-CM | POA: Diagnosis present

## 2019-10-04 DIAGNOSIS — Z8719 Personal history of other diseases of the digestive system: Secondary | ICD-10-CM

## 2019-10-04 DIAGNOSIS — K449 Diaphragmatic hernia without obstruction or gangrene: Secondary | ICD-10-CM | POA: Diagnosis present

## 2019-10-04 DIAGNOSIS — Z90710 Acquired absence of both cervix and uterus: Secondary | ICD-10-CM

## 2019-10-04 DIAGNOSIS — I739 Peripheral vascular disease, unspecified: Secondary | ICD-10-CM | POA: Diagnosis present

## 2019-10-04 DIAGNOSIS — Z885 Allergy status to narcotic agent status: Secondary | ICD-10-CM

## 2019-10-04 DIAGNOSIS — N1831 Chronic kidney disease, stage 3a: Secondary | ICD-10-CM | POA: Diagnosis present

## 2019-10-04 DIAGNOSIS — K439 Ventral hernia without obstruction or gangrene: Secondary | ICD-10-CM | POA: Diagnosis present

## 2019-10-04 DIAGNOSIS — E039 Hypothyroidism, unspecified: Secondary | ICD-10-CM | POA: Diagnosis present

## 2019-10-04 DIAGNOSIS — Z7189 Other specified counseling: Secondary | ICD-10-CM

## 2019-10-04 DIAGNOSIS — Z9049 Acquired absence of other specified parts of digestive tract: Secondary | ICD-10-CM

## 2019-10-04 DIAGNOSIS — Z9079 Acquired absence of other genital organ(s): Secondary | ICD-10-CM

## 2019-10-04 LAB — COMPREHENSIVE METABOLIC PANEL
ALT: 16 U/L (ref 0–44)
AST: 25 U/L (ref 15–41)
Albumin: 2.3 g/dL — ABNORMAL LOW (ref 3.5–5.0)
Alkaline Phosphatase: 51 U/L (ref 38–126)
Anion gap: 8 (ref 5–15)
BUN: 18 mg/dL (ref 8–23)
CO2: 29 mmol/L (ref 22–32)
Calcium: 7.6 mg/dL — ABNORMAL LOW (ref 8.9–10.3)
Chloride: 96 mmol/L — ABNORMAL LOW (ref 98–111)
Creatinine, Ser: 1.12 mg/dL — ABNORMAL HIGH (ref 0.44–1.00)
GFR calc Af Amer: 52 mL/min — ABNORMAL LOW (ref >60)
GFR calc non Af Amer: 45 mL/min — ABNORMAL LOW (ref >60)
Glucose, Bld: 99 mg/dL (ref 70–99)
Potassium: 3.8 mmol/L (ref 3.5–5.1)
Sodium: 133 mmol/L — ABNORMAL LOW (ref 135–145)
Total Bilirubin: 0.5 mg/dL (ref 0.3–1.2)
Total Protein: 5.9 g/dL — ABNORMAL LOW (ref 6.5–8.1)

## 2019-10-04 LAB — CBC
HCT: 26.8 % — ABNORMAL LOW (ref 36.0–46.0)
Hemoglobin: 7.9 g/dL — ABNORMAL LOW (ref 12.0–15.0)
MCH: 23.6 pg — ABNORMAL LOW (ref 26.0–34.0)
MCHC: 29.5 g/dL — ABNORMAL LOW (ref 30.0–36.0)
MCV: 80 fL (ref 80.0–100.0)
Platelets: 305 10*3/uL (ref 150–400)
RBC: 3.35 MIL/uL — ABNORMAL LOW (ref 3.87–5.11)
RDW: 19.5 % — ABNORMAL HIGH (ref 11.5–15.5)
WBC: 8.4 10*3/uL (ref 4.0–10.5)
nRBC: 0 % (ref 0.0–0.2)

## 2019-10-04 LAB — CBC WITH DIFFERENTIAL/PLATELET
Abs Immature Granulocytes: 0.06 10*3/uL (ref 0.00–0.07)
Basophils Absolute: 0 10*3/uL (ref 0.0–0.1)
Basophils Relative: 0 %
Eosinophils Absolute: 0.2 10*3/uL (ref 0.0–0.5)
Eosinophils Relative: 2 %
HCT: 29.8 % — ABNORMAL LOW (ref 36.0–46.0)
Hemoglobin: 8.7 g/dL — ABNORMAL LOW (ref 12.0–15.0)
Immature Granulocytes: 1 %
Lymphocytes Relative: 14 %
Lymphs Abs: 1.4 10*3/uL (ref 0.7–4.0)
MCH: 23.4 pg — ABNORMAL LOW (ref 26.0–34.0)
MCHC: 29.2 g/dL — ABNORMAL LOW (ref 30.0–36.0)
MCV: 80.1 fL (ref 80.0–100.0)
Monocytes Absolute: 1 10*3/uL (ref 0.1–1.0)
Monocytes Relative: 10 %
Neutro Abs: 7.4 10*3/uL (ref 1.7–7.7)
Neutrophils Relative %: 73 %
Platelets: 366 10*3/uL (ref 150–400)
RBC: 3.72 MIL/uL — ABNORMAL LOW (ref 3.87–5.11)
RDW: 19.6 % — ABNORMAL HIGH (ref 11.5–15.5)
WBC: 10 10*3/uL (ref 4.0–10.5)
nRBC: 0 % (ref 0.0–0.2)

## 2019-10-04 LAB — TYPE AND SCREEN
ABO/RH(D): O POS
Antibody Screen: NEGATIVE

## 2019-10-04 LAB — PROTIME-INR
INR: 2.8 — ABNORMAL HIGH (ref 0.8–1.2)
Prothrombin Time: 29.6 seconds — ABNORMAL HIGH (ref 11.4–15.2)

## 2019-10-04 LAB — POC OCCULT BLOOD, ED: Fecal Occult Bld: POSITIVE — AB

## 2019-10-04 MED ORDER — SODIUM CHLORIDE 0.9 % IV SOLN: INTRAVENOUS | Status: DC

## 2019-10-04 MED ORDER — PANTOPRAZOLE SODIUM 40 MG IV SOLR
40.0000 mg | Freq: Once | INTRAVENOUS | Status: AC
  Administered 2019-10-04: 40 mg via INTRAVENOUS
  Filled 2019-10-04: qty 40

## 2019-10-04 MED ORDER — DICYCLOMINE HCL 10 MG PO CAPS
10.0000 mg | ORAL_CAPSULE | Freq: Two times a day (BID) | ORAL | Status: DC
  Administered 2019-10-04 – 2019-10-08 (×8): 10 mg via ORAL
  Filled 2019-10-04 (×8): qty 1

## 2019-10-04 MED ORDER — ACETAMINOPHEN 650 MG RE SUPP: 650.0000 mg | Freq: Four times a day (QID) | RECTAL | Status: DC | PRN

## 2019-10-04 MED ORDER — DRONABINOL 2.5 MG PO CAPS
2.5000 mg | ORAL_CAPSULE | Freq: Two times a day (BID) | ORAL | Status: DC
  Administered 2019-10-05 – 2019-10-07 (×4): 2.5 mg via ORAL
  Filled 2019-10-04 (×4): qty 1

## 2019-10-04 MED ORDER — DULOXETINE HCL 60 MG PO CPEP
60.0000 mg | ORAL_CAPSULE | Freq: Every day | ORAL | Status: DC
  Administered 2019-10-04 – 2019-10-06 (×3): 60 mg via ORAL
  Filled 2019-10-04: qty 1
  Filled 2019-10-04 (×2): qty 2

## 2019-10-04 MED ORDER — DIPHENHYDRAMINE HCL 25 MG PO CAPS: 25.0000 mg | ORAL_CAPSULE | Freq: Three times a day (TID) | ORAL | Status: DC | PRN

## 2019-10-04 MED ORDER — ONDANSETRON HCL 4 MG/2ML IJ SOLN
4.0000 mg | Freq: Once | INTRAMUSCULAR | Status: AC
  Administered 2019-10-04: 4 mg via INTRAVENOUS
  Filled 2019-10-04: qty 2

## 2019-10-04 MED ORDER — ACETAMINOPHEN 325 MG PO TABS: 650.0000 mg | ORAL_TABLET | Freq: Four times a day (QID) | ORAL | Status: DC | PRN

## 2019-10-04 MED ORDER — LEVOTHYROXINE SODIUM 137 MCG PO TABS
137.0000 ug | ORAL_TABLET | Freq: Every day | ORAL | Status: DC
  Administered 2019-10-05 – 2019-10-07 (×3): 137 ug via ORAL
  Filled 2019-10-04 (×5): qty 1

## 2019-10-04 MED ORDER — ONDANSETRON HCL 4 MG PO TABS: 4.0000 mg | ORAL_TABLET | Freq: Four times a day (QID) | ORAL | Status: DC | PRN

## 2019-10-04 MED ORDER — HALOBETASOL PROPIONATE 0.05 % EX CREA: 1.0000 "application " | TOPICAL_CREAM | Freq: Two times a day (BID) | CUTANEOUS | Status: DC | PRN

## 2019-10-04 MED ORDER — POLYVINYL ALCOHOL 1.4 % OP SOLN
1.0000 [drp] | Freq: Two times a day (BID) | OPHTHALMIC | Status: DC | PRN
  Filled 2019-10-04: qty 15

## 2019-10-04 MED ORDER — OXYCODONE HCL 5 MG PO TABS
10.0000 mg | ORAL_TABLET | ORAL | Status: DC | PRN
  Administered 2019-10-05 – 2019-10-07 (×5): 10 mg via ORAL
  Filled 2019-10-04 (×6): qty 2

## 2019-10-04 MED ORDER — PREDNISOLONE ACETATE 1 % OP SUSP
2.0000 [drp] | Freq: Four times a day (QID) | OPHTHALMIC | Status: DC
  Administered 2019-10-04 – 2019-10-07 (×11): 2 [drp] via OPHTHALMIC
  Filled 2019-10-04 (×3): qty 1
  Filled 2019-10-04: qty 5

## 2019-10-04 MED ORDER — FLUTICASONE FUROATE 100 MCG/ACT IN AEPB: 1.0000 | INHALATION_SPRAY | Freq: Every day | RESPIRATORY_TRACT | Status: DC

## 2019-10-04 MED ORDER — ALBUTEROL SULFATE (2.5 MG/3ML) 0.083% IN NEBU
3.0000 mL | INHALATION_SOLUTION | Freq: Four times a day (QID) | RESPIRATORY_TRACT | Status: DC | PRN
  Administered 2019-10-06: 3 mL via RESPIRATORY_TRACT
  Filled 2019-10-04: qty 3

## 2019-10-04 MED ORDER — FLUTICASONE PROPIONATE 50 MCG/ACT NA SUSP
2.0000 | Freq: Two times a day (BID) | NASAL | Status: DC | PRN
  Administered 2019-10-05: 2 via NASAL
  Filled 2019-10-04: qty 16

## 2019-10-04 MED ORDER — ONDANSETRON HCL 4 MG/2ML IJ SOLN
4.0000 mg | Freq: Four times a day (QID) | INTRAMUSCULAR | Status: DC | PRN
  Administered 2019-10-07: 4 mg via INTRAVENOUS
  Filled 2019-10-04 (×2): qty 2

## 2019-10-04 MED ORDER — ALPRAZOLAM 0.5 MG PO TABS
0.2500 mg | ORAL_TABLET | Freq: Every evening | ORAL | Status: DC | PRN
  Administered 2019-10-05: 0.25 mg via ORAL
  Filled 2019-10-04: qty 1

## 2019-10-04 NOTE — ED Notes (Signed)
Pure wick placed on patient. Tolerating well.

## 2019-10-04 NOTE — ED Triage Notes (Signed)
Pt reports she had 2 stools with large amounts of blood in it this morning. Stopped chemo last week due to weakness and inability to tolerate. States she just feels weak and tired.

## 2019-10-04 NOTE — ED Provider Notes (Addendum)
[facility] EMERGENCY DEPARTMENT Provider Note   CSN: [id number] Arrival date & time: 10/04/19  1127     History Chief Complaint  Patient presents with  . Rectal Bleeding    [name] is a 85 y.o. female.  HPI    This patient is an 85 year old female, she has known history of congestive heart failure but also has a history of cancer for which she is currently undergoing chemotherapy.  Her last dose was 5 to 6 weeks ago.  She has chronic blood loss anemia secondary to diverticular bleeding, she has been transfused in the past.  Over the last week she has had some progressive weakness and over the last 24 hours has noticed multiple bowel movements with large amounts of dark red blood in it.  She has had some mild abdominal cramping, she is mildly nauseated, she is not short of breath.  Her weakness is persistent, gradually worsening, the bleeding has been persistent and nothing seems to make this better.  She does have atrial fibrillation and takes Xarelto.  Past Medical History:  Diagnosis Date  . Anxiety   . Chronic blood loss anemia    03-04-2018 diverticular bleed and rectal bleeding,  transfused 2 units PRBCs 03-08-2018  . Colitis   . COPD (chronic obstructive pulmonary disease) (HCC)    Dr. Lamonte Sakai  . Depression   . Diastolic CHF, chronic ([place])   . Diverticulosis   . Family history of colon cancer   . Fibromyalgia   . Genetic testing 04/07/2018   MyRisk (35 genes) @ [facility] - No pathogenic mutations detected  . GERD (gastroesophageal reflux disease)   . Hemorrhoids   . Hiatal hernia   . Hip pain 07/2018   Right Hip Pain  . History of rectal polyps   . History of shingles   . Hypothyroidism   . IBS (irritable bowel syndrome)   . Lymphocytic colitis    Dr. Henrene Pastor  . Malignant ascites    Admission 06/2017 abdominal s/p parencentesis 07-01-2017 2.5L, 07-08-2016  2.7L, 07-12-2017  1481m  . Neuropathy due to chemotherapeutic drug ([place]   . On home O2   .  Osteoporosis   . Ovarian cancer ([facility]    Chemotherapy - Dr. GAlvy Bimler . Paroxysmal atrial fibrillation ([place]    Xarelto stopped 03-07-2018 due to lower GI bleed  . Pleural effusion    s/p  right thoracentesis, 02-2018 1.3L and 03-17-2018 right thoracentesis 643m, post cxr no residual effusion  . Psoriatic arthritis (HCIowa  . Schatzki's ring 2013  . Seasonal allergic rhinitis     Patient Active Problem List   Diagnosis Date Noted  . GI bleeding 10/04/2019  . Lobar pneumonia (HCHales[place]10/10/2018  . Atrial fibrillation (HCG[place]ret[place]10/10/2018  . Post herpetic neuralgia 06/26/2019  . Herpes zoster 06/05/2019  . Chronic respiratory failure with hypoxia (HCFlow[place]08/12/2018  . Symptomatic anemia 04/26/2019  . UTI (urinary tract infection) 12/29/2018  . Mucositis due to chemotherapy 12/09/2018  . CKD (chronic kidney disease) stage 3, GFR 30-59 ml/min 10/14/2018  . Chronic atrial fibrillation (HCKirk[place]01/04/2019  . Hypomagnesemia 09/22/2018  . Cancer associated pain 09/12/2018  . Goals of care, counseling/discussion 09/12/2018  . Other constipation 05/28/2018  . Psoriatic arthritis (HCSab[place]07/16/2019  . Genetic testing 04/07/2018  . Family history of colon cancer   . History of GI diverticular bleed 03/12/2018  . Lower GI bleed   . Diverticulosis of colon with hemorrhage   . Rectal bleeding 03/04/2018  .  Acute GI bleeding 03/04/2018  . Thrombocytopenia ([place]) 01/03/2018  . Anemia, chronic disease 12/13/2017  . Mild protein-calorie malnutrition ([place]) 12/13/2017  . Peripheral neuropathy due to chemotherapy ([place]) 12/13/2017  . S/P thoracentesis   . Atrial fibrillation with RVR ([place]) 11/08/2017  . HCAP (healthcare-associated pneumonia) 11/08/2017  . Pleural effusion 09/20/2017  . Acute on chronic diastolic heart failure ([place]) 08/19/2017  . Pancytopenia, acquired ([employer]) 08/15/2017  . Protein-calorie malnutrition, moderate ([place]) 07/30/2017  . Generalized weakness 07/30/2017  . Antineoplastic  chemotherapy induced pancytopenia ([place]) 07/20/2017  . Left ovarian epithelial cancer ([place]) 07/18/2017  . PNA (pneumonia) 07/07/2017  . Ascites 06/30/2017  . Ascites, malignant 06/30/2017  . Elevated CA-125 01/18/2017  . Depression 12/23/2015  . Peripheral edema 08/23/2015  . Fracture of hip, left, closed ([place]) 12/08/2013  . Hip fracture ([place]) 12/08/2013  . PAD (peripheral artery disease) ([place]) 11/25/2013  . Encounter for therapeutic drug monitoring 11/09/2013  . Cough 03/20/2013  . Total knee replacement status 10/07/2012  . Knee pain 10/07/2012  . Knee stiffness 10/07/2012  . Tachycardia 09/17/2012  . Chest pain 09/17/2012  . Difficulty in walking(719.7) 09/16/2012  . Muscle weakness (generalized) 09/16/2012  . Postop Acute blood loss anemia 08/08/2012  . Instability of prosthetic knee ([place]) 08/06/2012  . Bloating 02/14/2012  . Upper abdominal pain 02/14/2012  . Allergic rhinitis, seasonal 02/15/2011  . COLITIS 02/20/2010  . Diarrhea 01/02/2010  . ABDOMINAL PAIN -GENERALIZED 01/02/2010  . PERSONAL HX COLONIC POLYPS 01/02/2010  . Hypothyroidism 12/28/2009  . COPD (chronic obstructive pulmonary disease) ([place]) 12/28/2009  . Arthropathy 12/28/2009    Past Surgical History:  Procedure Laterality Date  . CARDIOVASCULAR STRESS TEST  09/23/2012   Low risk lexiscan nuclear study w/ apical thinning but no evidence of ischemia/  normal LV function and wall motion , ef 75%  . CATARACT EXTRACTION W/ INTRAOCULAR LENS  IMPLANT, BILATERAL  10/2016  . CHEST TUBE INSERTION Right 06/24/2018   Procedure: INSERTION PLEURAL DRAINAGE CATHETER;  Surgeon: Grace Isaac, MD;  Location: [place];  Service: Thoracic;  Laterality: Right;  . COLONOSCOPY    . DEBULKING N/A 03/20/2018   Procedure: DEBULKING;  Surgeon: Isabel Caprice, MD;  Location: [facility];  Service: Gynecology;  Laterality: N/A;  . EXAM UNDER ANESTHESIA WITH MANIPULATION OF KNEE Left 12-20-2003  dr Noemi Chapel   post TKA  . FEMUR IM NAIL  Left 12/11/2013   Procedure: INTRAMEDULLARY (IM) NAIL FEMORAL;  Surgeon: [name], MD;  Location: [facility];  Service: Orthopedics;  Laterality: Left;  . HYSTERECTOMY ABDOMINAL WITH SALPINGO-OOPHORECTOMY Bilateral 03/20/2018   Procedure: TOTAL HYSTERECTOMY ABDOMINAL WITH BILATERAL  SALPINGO-OOPHORECTOMY;  Surgeon: Isabel Caprice, MD;  Location: [facility];  Service: Gynecology;  Laterality: Bilateral;  . IR FLUORO GUIDE PORT INSERTION RIGHT  07/22/2017  . IR PARACENTESIS  07/12/2017  . IR THORACENTESIS ASP PLEURAL SPACE W/IMG GUIDE  03/17/2018  . IR THORACENTESIS ASP PLEURAL SPACE W/IMG GUIDE  05/08/2018  . IR THORACENTESIS ASP PLEURAL SPACE W/IMG GUIDE  07/20/2019  . IR US GUIDE VASC ACCESS RIGHT  07/22/2017  . KNEE ARTHROSCOPY W/ LATERAL RELEASE Left 09-03-2005   dr Noemi Chapel  [facility]   w/  Lysis Adhesions,  excision loose body's  . LAPAROSCOPIC CHOLECYSTECTOMY  12-04-2010  dr zeigler  . LAPAROTOMY N/A 03/20/2018   Procedure: EXPLORATORY LAPAROTOMY;  Surgeon: Isabel Caprice, MD;  [facility];  Service: Gynecology;  Laterality: N/A;  . OMENTECTOMY N/A 03/20/2018   Procedure: OMENTECTOMY;  Surgeon: Precious Haws  B, MD;  Location[facility];  Service: Gynecology;  Laterality: N/A;  . OTHER SURGICAL HISTORY  06/24/2018   Plurex Catheter inserted into the lungs  . TOTAL KNEE ARTHROPLASTY Left 09-08-2003   dr Noemi Chapel  [facility]  . TOTAL KNEE REVISION  08/06/2012   Procedure: TOTAL KNEE REVISION;  Surgeon: [name], MD;  [facility];  Service: Orthopedics;  Laterality: Left;  Left Total Knee Arthroplasty Revision  . TRANSTHORACIC ECHOCARDIOGRAM  08/20/2017   ef 60-65%,  grade 1 diastolic dysfunction/  trivial AR and TR  . Uterine polypectomy       OB History   No obstetric history on file.     Family History  Problem Relation Age of Onset  . Heart disease Father        pacemaker  . Lung cancer Father        smoker; deceased 70  . Colon cancer Mother 28       2nd rectal ca at 77  .  Colon cancer Maternal Grandfather        dx 7s; deceased 27s  . Depression Sister   . Leukemia Maternal Aunt        deceased 31    Social History   Tobacco Use  . Smoking status: Former Smoker    Packs/day: 1.00    Years: 20.00    Pack years: 20.00    Types: Cigarettes    Quit date: 10/08/1980    Years since quitting: 39.0  . Smokeless tobacco: Never Used  Substance Use Topics  . Alcohol use: Not Currently    Comment: rarely, 12-23-15 rarely  . Drug use: No    Home Medications Prior to Admission medications   Medication Sig Start Date End Date Taking? Authorizing Provider  albuterol (PROAIR HFA) 108 (90 Base) MCG/ACT inhaler Inhale 2 puffs into the lungs every 6 (six) hours as needed for wheezing or shortness of breath. 04/01/19  Yes Byrum, Rose Fillers, MD  ALPRAZolam Duanne Moron) 0.25 MG tablet Take 1 tablet (0.25 mg total) by mouth at bedtime as needed for anxiety. 11/15/17  Yes Amin, Ankit Chirag, MD  azelastine (ASTELIN) 0.1 % nasal spray Place 2 sprays into both nostrils 2 (two) times daily as needed for allergies.    Yes [provider]  Cholecalciferol (VITAMIN D) 2000 units CAPS Take 2,000 Units by mouth daily.    Yes [provider]  dicyclomine (BENTYL) 10 MG capsule TAKE 1 CAPSULE(10 MG) BY MOUTH TWICE DAILY 09/07/19  Yes [name], MD  diltiazem (CARDIZEM CD) 180 MG 24 hr capsule Take 1 capsule (180 mg total) by mouth 2 (two) times a day. 04/28/19  Yes Satira Sark, MD  diphenhydrAMINE (BENADRYL) 25 mg capsule Take 25 mg by mouth every 8 (eight) hours as needed for itching.    Yes [provider]  dronabinol (MARINOL) 2.5 MG capsule Take 1 capsule (2.5 mg total) by mouth 2 (two) times daily before a meal. 09/01/19  Yes Gorsuch, Ni, MD  DULoxetine (CYMBALTA) 60 MG capsule Take 1 capsule (60 mg total) by mouth at bedtime. 09/07/19  Yes Gorsuch, Ni, MD  fluticasone (FLONASE) 50 MCG/ACT nasal spray Place 2 sprays into both nostrils 2 (two) times  daily as needed (FOR NASAL CONGESTION.).  11/20/13  Yes [provider]  furosemide (LASIX) 40 MG tablet Take 1 tablet (40 mg total) by mouth daily. 10/13/18  Yes Satira Sark, MD  halobetasol (ULTRAVATE) 0.05 % cream Apply 1 application  topically 2 (two) times daily as needed (psoriasis).    Yes [provider]  hydrocortisone valerate cream (WESTCORT) 0.2 % Apply 1 application topically 2 (two) times daily. 06/25/19  Yes Tanner, Lyndon Code., PA-C  levothyroxine (SYNTHROID, LEVOTHROID) 137 MCG tablet Take 137 mcg by mouth daily before breakfast. For thyroid therapy   Yes [provider]  Magnesium 400 MG CAPS Take 400 mg by mouth daily.    Yes [provider]  Oxycodone HCl 10 MG TABS Take 1 tablet (10 mg total) by mouth every 4 (four) hours as needed. 09/01/19  Yes Gorsuch, Ni, MD  polyvinyl alcohol (LIQUIFILM TEARS) 1.4 % ophthalmic solution Place 1 drop into both eyes 2 (two) times daily as needed for dry eyes.    Yes [provider]  potassium chloride SA (K-DUR,KLOR-CON) 20 MEQ tablet TAKE 1 TABLET BY MOUTH DAILY AND TAKE 1 ADDITIONAL TABLET WHEN TAKING EXTRA LASIX DOSE Patient taking differently: Take 20 mEq by mouth daily.  11/10/18  Yes Satira Sark, MD  prednisoLONE acetate (PRED FORTE) 1 % ophthalmic suspension PLACE 1 GTT INTO OU QID FOR 1 WEEK THEN BID FOR 1 WEEK 08/17/19  Yes [provider]  Rivaroxaban (XARELTO) 15 MG TABS tablet Take 15 mg by mouth daily.   Yes [provider]  STIOLTO RESPIMAT 2.5-2.5 MCG/ACT AERS Inhale 2 puffs into the lungs daily. 09/28/19  Yes [provider]  Fluticasone Furoate (ARNUITY ELLIPTA) 100 MCG/ACT AEPB Inhale 1 puff into the lungs daily. Patient not taking: Reported on 10/04/2019 08/21/19   Martyn Ehrich, NP  Fluticasone-Umeclidin-Vilant (TRELEGY ELLIPTA) 100-62.5-25 MCG/INH AEPB Inhale 1 puff into the lungs daily. Patient not taking: Reported on 10/04/2019 09/17/19   Collene Gobble, MD  lidocaine (LIDODERM) 5 % Place 1 patch onto the skin daily. Remove & Discard patch within 12 hours or as directed by MD Patient not taking: Reported on 10/04/2019 08/21/19   Martyn Ehrich, NP  Calcium Carbonate (CALCIUM 600) 1500 MG TABS Take 2 tablets by mouth daily.    09/11/18  [provider]    Allergies    Codeine  Review of Systems   Review of Systems  All other systems reviewed and are negative.   Physical Exam Updated Vital Signs BP 120/78   Pulse 88   Temp 98.2 F (36.8 C) (Oral)   Resp 17   Ht 1.626 m (5' 4" )   Wt 72.6 kg   SpO2 100%   BMI 27.46 kg/m   Physical Exam Vitals and nursing note reviewed.  Constitutional:      General: She is not in acute distress.    Appearance: She is well-developed.  HENT:     Head: Normocephalic and atraumatic.     Mouth/Throat:     Pharynx: No oropharyngeal exudate.  Eyes:     General: No scleral icterus.       Right eye: No discharge.        Left eye: No discharge.     Conjunctiva/sclera: Conjunctivae normal.     Pupils: Pupils are equal, round, and reactive to light.  Neck:     Thyroid: No thyromegaly.     Vascular: No JVD.  Cardiovascular:     Rate and Rhythm: Normal rate. Rhythm irregular.     Heart sounds: Normal heart sounds. No murmur. No friction rub. No gallop.   Pulmonary:     Effort: Pulmonary effort is normal. No respiratory distress.     Breath sounds:  Normal breath sounds. No wheezing or rales.  Abdominal:     General: Bowel sounds are normal. There is no distension.     Palpations: Abdomen is soft. There is no mass.     Tenderness: There is abdominal tenderness ( Minimal tenderness over the epigastrium, mild tympanic sounds to percussion, no other abdominal tenderness, no guarding).  Genitourinary:    Comments: Chaperone present, rectal exam reveals dark blood, see anoscopy note below Musculoskeletal:        General: No tenderness. Normal range of motion.     Cervical back:  Normal range of motion and neck supple.  Lymphadenopathy:     Cervical: No cervical adenopathy.  Skin:    General: Skin is warm and dry.     Capillary Refill: Capillary refill takes less than 2 seconds.     Coloration: Skin is pale.     Findings: No erythema or rash.  Neurological:     Mental Status: She is alert.     Coordination: Coordination normal.     Comments: Awake alert and following commands, generally weak but able to perform everything I asked her to do  Psychiatric:        Behavior: Behavior normal.     ED Results / Procedures / Treatments   Labs (all labs ordered are listed, but only abnormal results are displayed) Labs Reviewed  COMPREHENSIVE METABOLIC PANEL - Abnormal; Notable for the following components:      Result Value   Sodium 133 (*)    Chloride 96 (*)    Creatinine, Ser 1.12 (*)    Calcium 7.6 (*)    Total Protein 5.9 (*)    Albumin 2.3 (*)    GFR calc non Af Amer 45 (*)    GFR calc Af Amer 52 (*)    All other components within normal limits  CBC WITH DIFFERENTIAL/PLATELET - Abnormal; Notable for the following components:   RBC 3.72 (*)    Hemoglobin 8.7 (*)    HCT 29.8 (*)    MCH 23.4 (*)    MCHC 29.2 (*)    RDW 19.6 (*)    All other components within normal limits  PROTIME-INR - Abnormal; Notable for the following components:   Prothrombin Time 29.6 (*)    INR 2.8 (*)    All other components within normal limits  POC OCCULT BLOOD, ED - Abnormal; Notable for the following components:   Fecal Occult Bld POSITIVE (*)    All other components within normal limits  TYPE AND SCREEN    EKG EKG Interpretation  Date/Time:  Sunday October 04 2019 12:34:24 EST Ventricular Rate:  91 PR Interval:  150 QRS Duration: 74 QT Interval:  350 QTC Calculation: 430 R Axis:   65 Text Interpretation: Normal sinus rhythm Low voltage QRS Borderline ECG since last tracing no significant change Confirmed by Noemi Chapel [phone]) on 10/04/2019 12:43:36  PM   Radiology No results found.  Procedures LOWER ENDOSCOPY  Date/Time: 10/04/2019 12:58 PM Performed by: Noemi Chapel, MD Authorized by: Noemi Chapel, MD  Consent: Verbal consent obtained. Risks and benefits: risks, benefits and alternatives were discussed Consent given by: patient Patient understanding: patient states understanding of the procedure being performed Required items: required blood products, implants, devices, and special equipment available Patient identity confirmed: verbally with patient Time out: Immediately prior to procedure a "time out" was called to verify the correct patient, procedure, equipment, support staff and site/side marked as required. Indications: rectal bleeding  Sedation: Patient  sedated: no  Preparation: Patient was prepped and draped in the usual sterile fashion. Scope type: anoscope External exam performed: yes Negative external exam findings: no pilonidal tenderness, no perianal induration, no perianal erythema and no external hemorrhoids Digital exam performed: no Procedure termination: procedure complete Comments: Red-colored soft stool in the rectal vault in the rectal vault, no hemorrhoids visualized, no fissures, no active bright red blood.  However stool is definitely red with some dark red blood containing as well.  No internal hemorrhoids / masses seen     (including critical care time)  Medications Ordered in ED Medications  0.9 %  sodium chloride infusion ( Intravenous New Bag/Given 10/04/19 1344)  pantoprazole (PROTONIX) injection 40 mg (40 mg Intravenous Given 10/04/19 1346)  ondansetron (ZOFRAN) injection 4 mg (4 mg Intravenous Given 10/04/19 1346)    ED Course  I have reviewed the triage vital signs and the nursing notes.  Pertinent labs & imaging results that were available during my care of the patient were reviewed by me and considered in my medical decision making (see chart for details).  Clinical Course as  of Oct 03 1438  Sun Oct 04, 2019  1414 The patient has fairly severe low albumin as well as total protein and a slightly low calcium.  Electrolytes are relatively normal.  INR is elevated at 2.8.  Again the patient takes Xarelto.  Hemoglobin is 8.7 down approximately 1 g from baseline and her Hemoccult test was immediately positive.  This is consistent with a gross blood that I visualized.  Blood pressure is normal at 120/78, she will need to be admitted to the hospital knowing that she has diverticulosis   [BM]    Clinical Course User Index [BM] Noemi Chapel, MD   MDM Rules/Calculators/A&P                      The patient has known diverticular disease, now with increased amounts of bleeding, generalized weakness, suspect anemia secondary to gastrointestinal bleeding.  The patient will need to be likely transfused and admitted.  At this time the patient appears hemodynamically stable, she will go to bed inside a room and out of the hallway for formal rectal exam.  See anoscopy note  Discussed with hospitalist, they will admit the patient to the hospital for this anemia.  Thankfully the patient is not hypotensive or tachycardic.  We will avoid transfusion unless the patient continues to drop, discussed with Dr. Roderic Palau who agrees  [name] was evaluated in Emergency Department on 10/05/2019 for the symptoms described in the history of present illness. She was evaluated in the context of the global COVID-19 pandemic, which necessitated consideration that the patient might be at risk for infection with the SARS-CoV-2 virus that causes COVID-19. Institutional protocols and algorithms that pertain to the evaluation of patients at risk for COVID-19 are in a state of rapid change based on information released by regulatory bodies including the CDC and federal and state organizations. These policies and algorithms were followed during the patient's care in the ED.  Final Clinical Impression(s) / ED  Diagnoses Final diagnoses:  Rectal bleeding  Iron deficiency anemia due to chronic blood loss    Rx / DC Orders ED Discharge Orders    None       Noemi Chapel, MD 10/04/19 1439    Noemi Chapel, MD 10/05/19 [phone]

## 2019-10-04 NOTE — H&P (Signed)
History and Physical    [name] UYQ:[id number] DOB: Oct 13, 1932 DOA: 10/04/2019  PCP: Asencion Noble, MD  Patient coming from: Home  I have personally briefly reviewed patient's old medical records in [place]  Chief Complaint: Bloody stools  HPI: [name] is a 85 y.o. female with medical history significant of COPD on home oxygen at 2 L, ovarian cancer who is currently undergoing chemotherapy, chronic kidney disease stage III, atrial fibrillation on anticoagulation, presents to the hospital with complaints of rectal bleeding.  She reports that for the past week, she has had progressive weakness.  She started to have multiple bowel movements containing large amounts of dark red blood over the past 24 hours.  She is also had mild abdominal cramping.  She has chronic shortness of breath and does not feel this is any worse than her baseline.  She is generally weak.  She is not had any vomiting.  She is not had any fever.  She has had a chronic cough which is unchanged.  ED Course: Vitals were noted to be stable.  She had stools test positive for Hemoccult.  Anoscopy performed by ER physician did not show any signs of active bleeding.  Remainder of labs were unrevealing.  Last hemoglobin from 10 days ago was 9.3.  On ER visit today, hemoglobin 8.7.  She is hemodynamically stable.  Review of Systems:  General: Positive for generalized weakness, negative for fever, malaise Respiratory: Positive for chronic shortness of breath and cough Cardiac: Negative for chest pain, palpitations GI: Positive for bloody stools, abdominal cramping, negative for nausea or vomiting All other systems reviewed and found to be negative.    Past Medical History:  Diagnosis Date  . Anxiety   . Chronic blood loss anemia    03-04-2018 diverticular bleed and rectal bleeding,  transfused 2 units PRBCs 03-08-2018  . Colitis   . COPD (chronic obstructive pulmonary disease) (HCC)    Dr. Lamonte Sakai  .  Depression   . Diastolic CHF, chronic ([place])   . Diverticulosis   . Family history of colon cancer   . Fibromyalgia   . Genetic testing 04/07/2018   MyRisk (35 genes) @ [facility] - No pathogenic mutations detected  . GERD (gastroesophageal reflux disease)   . Hemorrhoids   . Hiatal hernia   . Hip pain 07/2018   Right Hip Pain  . History of rectal polyps   . History of shingles   . Hypothyroidism   . IBS (irritable bowel syndrome)   . Lymphocytic colitis    Dr. Henrene Pastor  . Malignant ascites    Admission 06/2017 abdominal s/p parencentesis 07-01-2017 2.5L, 07-08-2016  2.7L, 07-12-2017  1450m  . [place] chemotherapeutic drug ([place]   . On home O2   . Osteoporosis   . Ovarian cancer ([facility]    Chemotherapy - Dr. GAlvy Bimler . [place]    Xarelto stopped 03-07-2018 due to lower GI bleed  . Pleural effusion    s/p  right thoracentesis, 02-2018 1.3L and 03-17-2018 right thoracentesis 6474m, post cxr no residual effusion  . Psoriatic arthritis ([place]  . Schatzki's ring 2013  . Seasonal allergic rhinitis     Past Surgical History:  Procedure Laterality Date  . CARDIOVASCULAR STRESS TEST  09/23/2012   Low risk lexiscan nuclear study w/ apical thinning but no evidence of ischemia/  normal LV function and wall motion , ef 75%  . CATARACT EXTRACTION W/ INTRAOCULAR LENS  IMPLANT, BILATERAL  10/2016  . CHEST TUBE INSERTION Right 06/24/2018   Procedure: INSERTION PLEURAL DRAINAGE CATHETER;  Surgeon: Grace Isaac, MD;  Location: [place];  Service: Thoracic;  Laterality: Right;  . COLONOSCOPY    . DEBULKING N/A 03/20/2018   Procedure: DEBULKING;  Surgeon: Isabel Caprice, MD;  Location: [facility];  Service: Gynecology;  Laterality: N/A;  . EXAM UNDER ANESTHESIA WITH MANIPULATION OF KNEE Left 12-20-2003  dr Noemi Chapel   post TKA  . FEMUR IM NAIL Left 12/11/2013   Procedure: INTRAMEDULLARY (IM) NAIL FEMORAL;  Surgeon: [name], MD;  Location: [facility];  Service:  Orthopedics;  Laterality: Left;  . HYSTERECTOMY ABDOMINAL WITH SALPINGO-OOPHORECTOMY Bilateral 03/20/2018   Procedure: TOTAL HYSTERECTOMY ABDOMINAL WITH BILATERAL  SALPINGO-OOPHORECTOMY;  Surgeon: Isabel Caprice, MD;  Location[facility];  Service: Gynecology;  Laterality: Bilateral;  . IR FLUORO GUIDE PORT INSERTION RIGHT  07/22/2017  . IR PARACENTESIS  07/12/2017  . IR THORACENTESIS ASP PLEURAL SPACE W/IMG GUIDE  03/17/2018  . IR THORACENTESIS ASP PLEURAL SPACE W/IMG GUIDE  05/08/2018  . IR THORACENTESIS ASP PLEURAL SPACE W/IMG GUIDE  07/20/2019  . IR US GUIDE VASC ACCESS RIGHT  07/22/2017  . KNEE ARTHROSCOPY W/ LATERAL RELEASE Left 09-03-2005   dr Noemi Chapel  [facility]   w/  Lysis Adhesions,  excision loose body's  . LAPAROSCOPIC CHOLECYSTECTOMY  12-04-2010  dr zeigler  . LAPAROTOMY N/A 03/20/2018   Procedure: EXPLORATORY LAPAROTOMY;  Surgeon: Isabel Caprice, MD;  [facility];  Service: Gynecology;  Laterality: N/A;  . OMENTECTOMY N/A 03/20/2018   Procedure: OMENTECTOMY;  Surgeon: Isabel Caprice, MD;  Location[facility];  Service: Gynecology;  Laterality: N/A;  . OTHER SURGICAL HISTORY  06/24/2018   Plurex Catheter inserted into the lungs  . TOTAL KNEE ARTHROPLASTY Left 09-08-2003   dr Noemi Chapel  [facility]  . TOTAL KNEE REVISION  08/06/2012   Procedure: TOTAL KNEE REVISION;  Surgeon: [name], MD;  Location[facility];  Service: Orthopedics;  Laterality: Left;  Left Total Knee Arthroplasty Revision  . TRANSTHORACIC ECHOCARDIOGRAM  08/20/2017   ef 60-65%,  grade 1 diastolic dysfunction/  trivial AR and TR  . Uterine polypectomy      Social History:  reports that she quit smoking about 39 years ago. Her smoking use included cigarettes. She has a 20.00 pack-year smoking history. She has never used smokeless tobacco. She reports previous alcohol use. She reports that she does not use drugs.  Allergies  Allergen Reactions  . Codeine Itching    Family History  Problem Relation Age of Onset  .  Heart disease Father        pacemaker  . Lung cancer Father        smoker; deceased 85  . Colon cancer Mother 61       2nd rectal ca at 36  . Colon cancer Maternal Grandfather        dx 46s; deceased 23s  . Depression Sister   . Leukemia Maternal Aunt        deceased 38     Prior to Admission medications   Medication Sig Start Date End Date Taking? Authorizing Provider  albuterol (PROAIR HFA) 108 (90 Base) MCG/ACT inhaler Inhale 2 puffs into the lungs every 6 (six) hours as needed for wheezing or shortness of breath. 04/01/19  Yes Byrum, Rose Fillers, MD  ALPRAZolam Duanne Moron) 0.25 MG tablet Take 1 tablet (0.25 mg total) by mouth at bedtime as needed for  anxiety. 11/15/17  Yes Amin, Ankit Chirag, MD  azelastine (ASTELIN) 0.1 % nasal spray Place 2 sprays into both nostrils 2 (two) times daily as needed for allergies.    Yes [provider]  Cholecalciferol (VITAMIN D) 2000 units CAPS Take 2,000 Units by mouth daily.    Yes [provider]  dicyclomine (BENTYL) 10 MG capsule TAKE 1 CAPSULE(10 MG) BY MOUTH TWICE DAILY 09/07/19  Yes [name], MD  diltiazem (CARDIZEM CD) 180 MG 24 hr capsule Take 1 capsule (180 mg total) by mouth 2 (two) times a day. 04/28/19  Yes Satira Sark, MD  diphenhydrAMINE (BENADRYL) 25 mg capsule Take 25 mg by mouth every 8 (eight) hours as needed for itching.    Yes [provider]  dronabinol (MARINOL) 2.5 MG capsule Take 1 capsule (2.5 mg total) by mouth 2 (two) times daily before a meal. 09/01/19  Yes Gorsuch, Ni, MD  DULoxetine (CYMBALTA) 60 MG capsule Take 1 capsule (60 mg total) by mouth at bedtime. 09/07/19  Yes Gorsuch, Ni, MD  fluticasone (FLONASE) 50 MCG/ACT nasal spray Place 2 sprays into both nostrils 2 (two) times daily as needed (FOR NASAL CONGESTION.).  11/20/13  Yes [provider]  furosemide (LASIX) 40 MG tablet Take 1 tablet (40 mg total) by mouth daily. 10/13/18  Yes Satira Sark, MD  halobetasol (ULTRAVATE)  0.05 % cream Apply 1 application topically 2 (two) times daily as needed (psoriasis).    Yes [provider]  hydrocortisone valerate cream (WESTCORT) 0.2 % Apply 1 application topically 2 (two) times daily. 06/25/19  Yes Tanner, Lyndon Code., PA-C  levothyroxine (SYNTHROID, LEVOTHROID) 137 MCG tablet Take 137 mcg by mouth daily before breakfast. For thyroid therapy   Yes [provider]  Magnesium 400 MG CAPS Take 400 mg by mouth daily.    Yes [provider]  Oxycodone HCl 10 MG TABS Take 1 tablet (10 mg total) by mouth every 4 (four) hours as needed. 09/01/19  Yes Gorsuch, Ni, MD  polyvinyl alcohol (LIQUIFILM TEARS) 1.4 % ophthalmic solution Place 1 drop into both eyes 2 (two) times daily as needed for dry eyes.    Yes [provider]  potassium chloride SA (K-DUR,KLOR-CON) 20 MEQ tablet TAKE 1 TABLET BY MOUTH DAILY AND TAKE 1 ADDITIONAL TABLET WHEN TAKING EXTRA LASIX DOSE Patient taking differently: Take 20 mEq by mouth daily.  11/10/18  Yes Satira Sark, MD  prednisoLONE acetate (PRED FORTE) 1 % ophthalmic suspension PLACE 1 GTT INTO OU QID FOR 1 WEEK THEN BID FOR 1 WEEK 08/17/19  Yes [provider]  Rivaroxaban (XARELTO) 15 MG TABS tablet Take 15 mg by mouth daily.   Yes [provider]  STIOLTO RESPIMAT 2.5-2.5 MCG/ACT AERS Inhale 2 puffs into the lungs daily. 09/28/19  Yes [provider]  Fluticasone Furoate (ARNUITY ELLIPTA) 100 MCG/ACT AEPB Inhale 1 puff into the lungs daily. Patient not taking: Reported on 10/04/2019 08/21/19   Martyn Ehrich, NP  Fluticasone-Umeclidin-Vilant (TRELEGY ELLIPTA) 100-62.5-25 MCG/INH AEPB Inhale 1 puff into the lungs daily. Patient not taking: Reported on 10/04/2019 09/17/19   Collene Gobble, MD  lidocaine (LIDODERM) 5 % Place 1 patch onto the skin daily. Remove & Discard patch within 12 hours or as directed by MD Patient not taking: Reported on 10/04/2019 08/21/19   Martyn Ehrich, NP    Calcium Carbonate (CALCIUM 600) 1500 MG TABS Take 2 tablets by mouth daily.    09/11/18  [provider]    Physical Exam: Vitals:   10/04/19 1715 10/04/19 1730 10/04/19 1800 10/04/19 1830  BP:  103/67 124/61 121/66  Pulse: 89     Resp: 19 (!) 22 12 12   Temp:      TempSrc:      SpO2: 93%     Weight:      Height:        Constitutional: NAD, calm, comfortable Eyes: PERRL, lids and conjunctivae normal ENMT: Mucous membranes are moist. Posterior pharynx clear of any exudate or lesions.Normal dentition.  Neck: normal, supple, no masses, no thyromegaly Respiratory: clear to auscultation bilaterally, no wheezing, no crackles. Normal respiratory effort. No accessory muscle use.  Cardiovascular: Regular rate and rhythm, no murmurs / rubs / gallops. No extremity edema. 2+ pedal pulses. No carotid bruits.  Abdomen: no tenderness, no masses palpated. No hepatosplenomegaly. Bowel sounds positive.  Musculoskeletal: no clubbing / cyanosis. No joint deformity upper and lower extremities. Good ROM, no contractures. Normal muscle tone.  Skin: no rashes, lesions, ulcers. No induration Neurologic: CN 2-12 grossly intact. Sensation intact, DTR normal. Strength 5/5 in all 4.  Psychiatric: Normal judgment and insight. Alert and oriented x 3. Normal mood.    Labs on Admission: I have personally reviewed following labs and imaging studies  CBC: Recent Labs  Lab 10/04/19 1307  WBC 10.0  NEUTROABS 7.4  HGB 8.7*  HCT 29.8*  MCV 80.1  PLT 511   Basic Metabolic Panel: Recent Labs  Lab 10/04/19 1307  NA 133*  K 3.8  CL 96*  CO2 29  GLUCOSE 99  BUN 18  CREATININE 1.12*  CALCIUM 7.6*   GFR: Estimated Creatinine Clearance: 36.5 mL/min (A) (by C-G formula based on SCr of 1.12 mg/dL (H)). Liver Function Tests: Recent Labs  Lab 10/04/19 1307  AST 25  ALT 16  ALKPHOS 51  BILITOT 0.5  PROT 5.9*  ALBUMIN 2.3*   No results for input(s): LIPASE, AMYLASE in the last 168 hours. No  results for input(s): AMMONIA in the last 168 hours. Coagulation Profile: Recent Labs  Lab 10/04/19 1307  INR 2.8*   Cardiac Enzymes: No results for input(s): CKTOTAL, CKMB, CKMBINDEX, TROPONINI in the last 168 hours. BNP (last 3 results) No results for input(s): PROBNP in the last 8760 hours. HbA1C: No results for input(s): HGBA1C in the last 72 hours. CBG: No results for input(s): GLUCAP in the last 168 hours. Lipid Profile: No results for input(s): CHOL, HDL, LDLCALC, TRIG, CHOLHDL, LDLDIRECT in the last 72 hours. Thyroid Function Tests: No results for input(s): TSH, T4TOTAL, FREET4, T3FREE, THYROIDAB in the last 72 hours. Anemia Panel: No results for input(s): VITAMINB12, FOLATE, FERRITIN, TIBC, IRON, RETICCTPCT in the last 72 hours. Urine analysis:    Component Value Date/Time   COLORURINE YELLOW 04/26/2019 1729   APPEARANCEUR HAZY (A) 04/26/2019 1729   LABSPEC 1.009 04/26/2019 1729   LABSPEC 1.030 10/11/2017 1125   PHURINE 6.0 04/26/2019 1729   GLUCOSEU NEGATIVE 04/26/2019 1729   GLUCOSEU Negative 10/11/2017 1125   HGBUR MODERATE (A) 04/26/2019 1729   BILIRUBINUR NEGATIVE 04/26/2019 1729   BILIRUBINUR Negative 10/11/2017 1125   KETONESUR NEGATIVE 04/26/2019 1729   PROTEINUR NEGATIVE 04/26/2019 1729   UROBILINOGEN 0.2 10/11/2017 1125   NITRITE NEGATIVE 04/26/2019 1729   LEUKOCYTESUR MODERATE (A) 04/26/2019 1729   LEUKOCYTESUR Negative 10/11/2017 1125    Radiological Exams on Admission: No results found.  EKG: Independently reviewed.  Normal sinus rhythm without acute changes  Assessment/Plan Active Problems:  Hypothyroidism   COPD (chronic obstructive pulmonary disease) (HCC)   Left ovarian epithelial cancer (HCC)   Anemia, chronic disease   CKD (chronic kidney disease) stage 3, GFR 30-59 ml/min   Chronic atrial fibrillation (HCC)   Chronic respiratory failure with hypoxia (HCC)   GI bleeding     1. Rectal bleeding.  Patient has a history of  diverticulosis.  With normal BUN, suspect lower GI bleed.  Will consult gastroenterology.  Keep on clear liquids for now.  Monitor hemoglobin.  She is chronically on Xarelto which is currently on hold. 2. Acute blood loss anemia superimposed on anemia of chronic disease.  Continue to follow hemoglobin.  Transfuse as needed. 3. Chronic kidney disease stage III.  Creatinine is currently near baseline.  Continue to monitor. 4. COPD with chronic respiratory failure.  Currently at her baseline oxygen requirement of 2 L.  No shortness of breath or wheezing.  Continue bronchodilators. 5. Left ovarian epithelial cancer.  Continue to follow-up with oncology for chemotherapy. 6. Hypothyroidism.  Continue Synthroid 7. Chronic atrial fibrillation.  Hold diltiazem as to avoid hypotension in the setting of bleeding.  Hold anticoagulation for now.  Heart rate is currently stable.  DVT prophylaxis: SCDs Code Status: Full code Family Communication: Discussed with patient Disposition Plan: Discharge home once bleeding issues have resolved Consults called: Gastroenterology Admission status: Observation, telemetry  Kathie Dike MD [facility]   If 7PM-7AM, please contact night-coverage [email or web address]   10/04/2019, 8:12 PM

## 2019-10-05 ENCOUNTER — Encounter (HOSPITAL_COMMUNITY): Payer: Self-pay | Admitting: Family Medicine

## 2019-10-05 ENCOUNTER — Telehealth: Payer: Self-pay

## 2019-10-05 DIAGNOSIS — M810 Age-related osteoporosis without current pathological fracture: Secondary | ICD-10-CM | POA: Diagnosis present

## 2019-10-05 DIAGNOSIS — C562 Malignant neoplasm of left ovary: Secondary | ICD-10-CM | POA: Diagnosis present

## 2019-10-05 DIAGNOSIS — I482 Chronic atrial fibrillation, unspecified: Secondary | ICD-10-CM | POA: Diagnosis present

## 2019-10-05 DIAGNOSIS — I739 Peripheral vascular disease, unspecified: Secondary | ICD-10-CM | POA: Diagnosis present

## 2019-10-05 DIAGNOSIS — D62 Acute posthemorrhagic anemia: Secondary | ICD-10-CM | POA: Diagnosis present

## 2019-10-05 DIAGNOSIS — D6489 Other specified anemias: Secondary | ICD-10-CM | POA: Diagnosis not present

## 2019-10-05 DIAGNOSIS — Z7189 Other specified counseling: Secondary | ICD-10-CM | POA: Diagnosis not present

## 2019-10-05 DIAGNOSIS — R180 Malignant ascites: Secondary | ICD-10-CM | POA: Diagnosis not present

## 2019-10-05 DIAGNOSIS — K589 Irritable bowel syndrome without diarrhea: Secondary | ICD-10-CM | POA: Diagnosis present

## 2019-10-05 DIAGNOSIS — J449 Chronic obstructive pulmonary disease, unspecified: Secondary | ICD-10-CM | POA: Diagnosis present

## 2019-10-05 DIAGNOSIS — C569 Malignant neoplasm of unspecified ovary: Secondary | ICD-10-CM | POA: Diagnosis not present

## 2019-10-05 DIAGNOSIS — K219 Gastro-esophageal reflux disease without esophagitis: Secondary | ICD-10-CM | POA: Diagnosis present

## 2019-10-05 DIAGNOSIS — M797 Fibromyalgia: Secondary | ICD-10-CM | POA: Diagnosis present

## 2019-10-05 DIAGNOSIS — R791 Abnormal coagulation profile: Secondary | ICD-10-CM | POA: Diagnosis present

## 2019-10-05 DIAGNOSIS — N183 Chronic kidney disease, stage 3 unspecified: Secondary | ICD-10-CM | POA: Diagnosis not present

## 2019-10-05 DIAGNOSIS — K625 Hemorrhage of anus and rectum: Secondary | ICD-10-CM | POA: Diagnosis not present

## 2019-10-05 DIAGNOSIS — B0229 Other postherpetic nervous system involvement: Secondary | ICD-10-CM | POA: Diagnosis present

## 2019-10-05 DIAGNOSIS — I5032 Chronic diastolic (congestive) heart failure: Secondary | ICD-10-CM | POA: Diagnosis present

## 2019-10-05 DIAGNOSIS — K922 Gastrointestinal hemorrhage, unspecified: Secondary | ICD-10-CM | POA: Diagnosis not present

## 2019-10-05 DIAGNOSIS — K5791 Diverticulosis of intestine, part unspecified, without perforation or abscess with bleeding: Secondary | ICD-10-CM | POA: Diagnosis not present

## 2019-10-05 DIAGNOSIS — J9611 Chronic respiratory failure with hypoxia: Secondary | ICD-10-CM | POA: Diagnosis present

## 2019-10-05 DIAGNOSIS — N1831 Chronic kidney disease, stage 3a: Secondary | ICD-10-CM | POA: Diagnosis present

## 2019-10-05 DIAGNOSIS — L4050 Arthropathic psoriasis, unspecified: Secondary | ICD-10-CM | POA: Diagnosis present

## 2019-10-05 DIAGNOSIS — E039 Hypothyroidism, unspecified: Secondary | ICD-10-CM | POA: Diagnosis present

## 2019-10-05 DIAGNOSIS — I4891 Unspecified atrial fibrillation: Secondary | ICD-10-CM | POA: Diagnosis not present

## 2019-10-05 DIAGNOSIS — K5731 Diverticulosis of large intestine without perforation or abscess with bleeding: Secondary | ICD-10-CM | POA: Diagnosis present

## 2019-10-05 DIAGNOSIS — K449 Diaphragmatic hernia without obstruction or gangrene: Secondary | ICD-10-CM | POA: Diagnosis present

## 2019-10-05 DIAGNOSIS — Z515 Encounter for palliative care: Secondary | ICD-10-CM | POA: Diagnosis not present

## 2019-10-05 DIAGNOSIS — J302 Other seasonal allergic rhinitis: Secondary | ICD-10-CM | POA: Diagnosis present

## 2019-10-05 DIAGNOSIS — F329 Major depressive disorder, single episode, unspecified: Secondary | ICD-10-CM | POA: Diagnosis present

## 2019-10-05 DIAGNOSIS — D638 Anemia in other chronic diseases classified elsewhere: Secondary | ICD-10-CM | POA: Diagnosis not present

## 2019-10-05 DIAGNOSIS — Z66 Do not resuscitate: Secondary | ICD-10-CM | POA: Diagnosis not present

## 2019-10-05 DIAGNOSIS — Z20828 Contact with and (suspected) exposure to other viral communicable diseases: Secondary | ICD-10-CM | POA: Diagnosis present

## 2019-10-05 DIAGNOSIS — F419 Anxiety disorder, unspecified: Secondary | ICD-10-CM | POA: Diagnosis present

## 2019-10-05 LAB — COMPREHENSIVE METABOLIC PANEL
ALT: 15 U/L (ref 0–44)
AST: 24 U/L (ref 15–41)
Albumin: 2.1 g/dL — ABNORMAL LOW (ref 3.5–5.0)
Alkaline Phosphatase: 44 U/L (ref 38–126)
Anion gap: 9 (ref 5–15)
BUN: 15 mg/dL (ref 8–23)
CO2: 28 mmol/L (ref 22–32)
Calcium: 7.7 mg/dL — ABNORMAL LOW (ref 8.9–10.3)
Chloride: 97 mmol/L — ABNORMAL LOW (ref 98–111)
Creatinine, Ser: 0.91 mg/dL (ref 0.44–1.00)
GFR calc Af Amer: >60 mL/min (ref >60)
GFR calc non Af Amer: 58 mL/min — ABNORMAL LOW (ref >60)
Glucose, Bld: 64 mg/dL — ABNORMAL LOW (ref 70–99)
Potassium: 3.7 mmol/L (ref 3.5–5.1)
Sodium: 134 mmol/L — ABNORMAL LOW (ref 135–145)
Total Bilirubin: 0.8 mg/dL (ref 0.3–1.2)
Total Protein: 5.3 g/dL — ABNORMAL LOW (ref 6.5–8.1)

## 2019-10-05 LAB — CBC
HCT: 28.8 % — ABNORMAL LOW (ref 36.0–46.0)
Hemoglobin: 8 g/dL — ABNORMAL LOW (ref 12.0–15.0)
MCH: 22.3 pg — ABNORMAL LOW (ref 26.0–34.0)
MCHC: 27.8 g/dL — ABNORMAL LOW (ref 30.0–36.0)
MCV: 80.4 fL (ref 80.0–100.0)
Platelets: 323 10*3/uL (ref 150–400)
RBC: 3.58 MIL/uL — ABNORMAL LOW (ref 3.87–5.11)
RDW: 19.3 % — ABNORMAL HIGH (ref 11.5–15.5)
WBC: 8.4 10*3/uL (ref 4.0–10.5)
nRBC: 0 % (ref 0.0–0.2)

## 2019-10-05 LAB — HEMOGLOBIN AND HEMATOCRIT, BLOOD
HCT: 29.2 % — ABNORMAL LOW (ref 36.0–46.0)
HCT: 28.4 % — ABNORMAL LOW (ref 36.0–46.0)
Hemoglobin: 8.5 g/dL — ABNORMAL LOW (ref 12.0–15.0)
Hemoglobin: 8.1 g/dL — ABNORMAL LOW (ref 12.0–15.0)

## 2019-10-05 LAB — SARS CORONAVIRUS 2 (TAT 6-24 HRS): SARS Coronavirus 2: NEGATIVE

## 2019-10-05 MED ORDER — CHLORHEXIDINE GLUCONATE CLOTH 2 % EX PADS
6.0000 | MEDICATED_PAD | Freq: Every day | CUTANEOUS | Status: DC
  Administered 2019-10-06 – 2019-10-07 (×2): 6 via TOPICAL

## 2019-10-05 MED ORDER — DILTIAZEM HCL ER COATED BEADS 180 MG PO CP24
180.0000 mg | ORAL_CAPSULE | Freq: Two times a day (BID) | ORAL | Status: DC
  Administered 2019-10-05 – 2019-10-07 (×5): 180 mg via ORAL
  Filled 2019-10-05 (×9): qty 1

## 2019-10-05 MED ORDER — METOPROLOL TARTRATE 5 MG/5ML IV SOLN: 5.0000 mg | Freq: Once | INTRAVENOUS | Status: DC

## 2019-10-05 MED ORDER — BUDESONIDE 0.25 MG/2ML IN SUSP: 0.2500 mg | Freq: Two times a day (BID) | RESPIRATORY_TRACT | Status: DC

## 2019-10-05 MED ORDER — HYDROCORTISONE 1 % EX CREA
TOPICAL_CREAM | Freq: Two times a day (BID) | CUTANEOUS | Status: DC | PRN
  Filled 2019-10-05: qty 28

## 2019-10-05 MED ORDER — METOPROLOL TARTRATE 5 MG/5ML IV SOLN
2.5000 mg | Freq: Four times a day (QID) | INTRAVENOUS | Status: DC | PRN
  Filled 2019-10-05: qty 5

## 2019-10-05 MED ORDER — POLYVINYL ALCOHOL 1.4 % OP SOLN: 1.0000 [drp] | OPHTHALMIC | Status: DC | PRN

## 2019-10-05 MED ORDER — ALPRAZOLAM 0.25 MG PO TABS
0.2500 mg | ORAL_TABLET | Freq: Four times a day (QID) | ORAL | Status: DC | PRN
  Administered 2019-10-05 – 2019-10-07 (×5): 0.25 mg via ORAL
  Filled 2019-10-05 (×5): qty 1

## 2019-10-05 MED ORDER — BUDESONIDE 0.25 MG/2ML IN SUSP
0.2500 mg | Freq: Two times a day (BID) | RESPIRATORY_TRACT | Status: DC
  Filled 2019-10-05: qty 2

## 2019-10-05 NOTE — Telephone Encounter (Signed)
[name] called and left a message. She called the ambulance yesterday and her Mom was taken to [facility]. They refused to take her to [facility]. She was having bleeding she thinks from her rectum. She is crying on the phone.  Does she need to be transferred to [facility]? She has been so weak lately and [name] does not think that she can take care of her anymore. Would she qualify for a hospice bed? What should she do?

## 2019-10-05 NOTE — ED Notes (Signed)
Pt's HR increased to 140-150bpm and pt became very SOB upon standing to get to the bedside commode. Pt had large, brown bowel movement. After a few minutes of return to bed, pt's heart rate began to decrease, now 93bpm. Dr. Wynetta Emery aware and at bedside. Order given for Metoprolol IV prn. Will continue to monitor.

## 2019-10-05 NOTE — Consult Note (Signed)
Referring Provider: Murlean Iba, MD Primary Care Physician:  Asencion Noble, MD Primary Gastroenterologist:  Rudolpho Sevin, Dr. Henrene Pastor  Reason for Consultation:  Bloody stools  HPI: [name] is a 85 y.o. female with history of COPD on home oxygen, ovarian cancer last chemo in September 2020, on hold for weakness/pneumonia, chronic kidney disease stage II, Afib on anticoagulation who presented to hospital with complaints of rectal bleeding. Worsening weakness over the past week. Describes multiple stools with large amounts of red blood over 24 hours. She denies significant abdominal pain. No vomiting.  Similar bleeding to last year when she was in the hospital with suspected diverticular bleed. Somewhat poor appetite. No significant heartburn or dysphagia. Denies black stools. Typically has several small stools daily at baseline.   On presentation her Hgb was 8.7. Hemoglobin in the nine range over the past four weeks. Last blood transfusion early November. On anoscopy exam in ER, she was noted to have dark red blood and red colored soft stool present. Her Hgb today was 8.0. BUN 18. Patient reports nonbloody stool this morning.   Patient has h/o diverticular bleed requiring hospitalization in 2019. H/o microscopic colitis as well. Patient's last colonoscopy in 2011 with documented left colon diverticulosis and internal hemorrhoids. EGD 2013 with schatzki ring and hiatal hernia.   Patient reports last dose of Xarelto on evening of 10/03/19.   Prior to Admission medications   Medication Sig Start Date End Date Taking? Authorizing Provider  albuterol (PROAIR HFA) 108 (90 Base) MCG/ACT inhaler Inhale 2 puffs into the lungs every 6 (six) hours as needed for wheezing or shortness of breath. 04/01/19  Yes Byrum, Rose Fillers, MD  ALPRAZolam Duanne Moron) 0.25 MG tablet Take 1 tablet (0.25 mg total) by mouth at bedtime as needed for anxiety. 11/15/17  Yes Amin, Ankit Chirag, MD  azelastine (ASTELIN) 0.1 % nasal  spray Place 2 sprays into both nostrils 2 (two) times daily as needed for allergies.    Yes [provider]  Cholecalciferol (VITAMIN D) 2000 units CAPS Take 2,000 Units by mouth daily.    Yes [provider]  dicyclomine (BENTYL) 10 MG capsule TAKE 1 CAPSULE(10 MG) BY MOUTH TWICE DAILY 09/07/19  Yes [name], MD  diltiazem (CARDIZEM CD) 180 MG 24 hr capsule Take 1 capsule (180 mg total) by mouth 2 (two) times a day. 04/28/19  Yes Satira Sark, MD  diphenhydrAMINE (BENADRYL) 25 mg capsule Take 25 mg by mouth every 8 (eight) hours as needed for itching.    Yes [provider]  dronabinol (MARINOL) 2.5 MG capsule Take 1 capsule (2.5 mg total) by mouth 2 (two) times daily before a meal. 09/01/19  Yes Gorsuch, Ni, MD  DULoxetine (CYMBALTA) 60 MG capsule Take 1 capsule (60 mg total) by mouth at bedtime. 09/07/19  Yes Gorsuch, Ni, MD  fluticasone (FLONASE) 50 MCG/ACT nasal spray Place 2 sprays into both nostrils 2 (two) times daily as needed (FOR NASAL CONGESTION.).  11/20/13  Yes [provider]  furosemide (LASIX) 40 MG tablet Take 1 tablet (40 mg total) by mouth daily. 10/13/18  Yes Satira Sark, MD  halobetasol (ULTRAVATE) 0.05 % cream Apply 1 application topically 2 (two) times daily as needed (psoriasis).    Yes [provider]  hydrocortisone valerate cream (WESTCORT) 0.2 % Apply 1 application topically 2 (two) times daily. 06/25/19  Yes Tanner, Lyndon Code., PA-C  levothyroxine (SYNTHROID, LEVOTHROID) 137 MCG tablet Take 137 mcg by mouth daily before breakfast.  For thyroid therapy   Yes [provider]  Magnesium 400 MG CAPS Take 400 mg by mouth daily.    Yes [provider]  Oxycodone HCl 10 MG TABS Take 1 tablet (10 mg total) by mouth every 4 (four) hours as needed. 09/01/19  Yes Gorsuch, Ni, MD  polyvinyl alcohol (LIQUIFILM TEARS) 1.4 % ophthalmic solution Place 1 drop into both eyes 2 (two) times daily as needed for dry eyes.     Yes [provider]  potassium chloride SA (K-DUR,KLOR-CON) 20 MEQ tablet TAKE 1 TABLET BY MOUTH DAILY AND TAKE 1 ADDITIONAL TABLET WHEN TAKING EXTRA LASIX DOSE Patient taking differently: Take 20 mEq by mouth daily.  11/10/18  Yes Satira Sark, MD  prednisoLONE acetate (PRED FORTE) 1 % ophthalmic suspension PLACE 1 GTT INTO OU QID FOR 1 WEEK THEN BID FOR 1 WEEK 08/17/19  Yes [provider]  Rivaroxaban (XARELTO) 15 MG TABS tablet Take 15 mg by mouth daily.   Yes [provider]  STIOLTO RESPIMAT 2.5-2.5 MCG/ACT AERS Inhale 2 puffs into the lungs daily. 09/28/19  Yes [provider]  Fluticasone Furoate (ARNUITY ELLIPTA) 100 MCG/ACT AEPB Inhale 1 puff into the lungs daily. Patient not taking: Reported on 10/04/2019 08/21/19   Martyn Ehrich, NP  Fluticasone-Umeclidin-Vilant (TRELEGY ELLIPTA) 100-62.5-25 MCG/INH AEPB Inhale 1 puff into the lungs daily. Patient not taking: Reported on 10/04/2019 09/17/19   Collene Gobble, MD  lidocaine (LIDODERM) 5 % Place 1 patch onto the skin daily. Remove & Discard patch within 12 hours or as directed by MD Patient not taking: Reported on 10/04/2019 08/21/19   Martyn Ehrich, NP  Calcium Carbonate (CALCIUM 600) 1500 MG TABS Take 2 tablets by mouth daily.    09/11/18  [provider]    Current Facility-Administered Medications  Medication Dose Route Frequency Provider Last Rate Last Admin  . 0.9 %  sodium chloride infusion   Intravenous Continuous Wynetta Emery, Clanford L, MD 75 mL/hr at 10/04/19 2114 New Bag at 10/04/19 2114  . acetaminophen (TYLENOL) tablet 650 mg  650 mg Oral Q6H PRN Kathie Dike, MD       Or  . acetaminophen (TYLENOL) suppository 650 mg  650 mg Rectal Q6H PRN Kathie Dike, MD      . albuterol (PROVENTIL) (2.5 MG/3ML) 0.083% nebulizer solution 3 mL  3 mL Inhalation Q6H PRN Kathie Dike, MD      . ALPRAZolam Duanne Moron) tablet 0.25 mg  0.25 mg Oral QHS PRN Kathie Dike, MD   0.25 mg  at 10/05/19 0038  . dicyclomine (BENTYL) capsule 10 mg  10 mg Oral BID Kathie Dike, MD   10 mg at 10/04/19 2133  . diphenhydrAMINE (BENADRYL) capsule 25 mg  25 mg Oral Q8H PRN Kathie Dike, MD      . dronabinol (MARINOL) capsule 2.5 mg  2.5 mg Oral BID AC Memon, Jolaine Artist, MD      . DULoxetine (CYMBALTA) DR capsule 60 mg  60 mg Oral QHS Kathie Dike, MD   60 mg at 10/04/19 2133  . fluticasone (FLONASE) 50 MCG/ACT nasal spray 2 spray  2 spray Each Nare BID PRN Kathie Dike, MD      . Fluticasone Furoate AEPB 1 puff  1 puff Inhalation Daily Memon, Jehanzeb, MD      . halobetasol (ULTRAVATE) 2.67 % cream 1 application  1 application Topical BID PRN Kathie Dike, MD      . levothyroxine (SYNTHROID) tablet 137 mcg  137 mcg  Oral QAC breakfast Kathie Dike, MD      . ondansetron (ZOFRAN) tablet 4 mg  4 mg Oral Q6H PRN Kathie Dike, MD       Or  . ondansetron (ZOFRAN) injection 4 mg  4 mg Intravenous Q6H PRN Kathie Dike, MD      . oxyCODONE (Oxy IR/ROXICODONE) immediate release tablet 10 mg  10 mg Oral Q4H PRN Kathie Dike, MD      . polyvinyl alcohol (LIQUIFILM TEARS) 1.4 % ophthalmic solution 1 drop  1 drop Both Eyes PRN Johnson, Clanford L, MD      . prednisoLONE acetate (PRED FORTE) 1 % ophthalmic suspension 2 drop  2 drop Both Eyes QID Kathie Dike, MD   2 drop at 10/04/19 2317   Current Outpatient Medications  Medication Sig Dispense Refill  . albuterol (PROAIR HFA) 108 (90 Base) MCG/ACT inhaler Inhale 2 puffs into the lungs every 6 (six) hours as needed for wheezing or shortness of breath. 18 g 5  . ALPRAZolam (XANAX) 0.25 MG tablet Take 1 tablet (0.25 mg total) by mouth at bedtime as needed for anxiety. 5 tablet 0  . azelastine (ASTELIN) 0.1 % nasal spray Place 2 sprays into both nostrils 2 (two) times daily as needed for allergies.     . Cholecalciferol (VITAMIN D) 2000 units CAPS Take 2,000 Units by mouth daily.     Marland Kitchen dicyclomine (BENTYL) 10 MG capsule TAKE 1  CAPSULE(10 MG) BY MOUTH TWICE DAILY 60 capsule 0  . diltiazem (CARDIZEM CD) 180 MG 24 hr capsule Take 1 capsule (180 mg total) by mouth 2 (two) times a day. 180 capsule 3  . diphenhydrAMINE (BENADRYL) 25 mg capsule Take 25 mg by mouth every 8 (eight) hours as needed for itching.     . dronabinol (MARINOL) 2.5 MG capsule Take 1 capsule (2.5 mg total) by mouth 2 (two) times daily before a meal. 60 capsule 0  . DULoxetine (CYMBALTA) 60 MG capsule Take 1 capsule (60 mg total) by mouth at bedtime. 30 capsule 3  . fluticasone (FLONASE) 50 MCG/ACT nasal spray Place 2 sprays into both nostrils 2 (two) times daily as needed (FOR NASAL CONGESTION.).     Marland Kitchen furosemide (LASIX) 40 MG tablet Take 1 tablet (40 mg total) by mouth daily. 90 tablet 3  . halobetasol (ULTRAVATE) 0.05 % cream Apply 1 application topically 2 (two) times daily as needed (psoriasis).     . hydrocortisone valerate cream (WESTCORT) 0.2 % Apply 1 application topically 2 (two) times daily. 45 g 0  . levothyroxine (SYNTHROID, LEVOTHROID) 137 MCG tablet Take 137 mcg by mouth daily before breakfast. For thyroid therapy    . Magnesium 400 MG CAPS Take 400 mg by mouth daily.     . Oxycodone HCl 10 MG TABS Take 1 tablet (10 mg total) by mouth every 4 (four) hours as needed. 90 tablet 0  . polyvinyl alcohol (LIQUIFILM TEARS) 1.4 % ophthalmic solution Place 1 drop into both eyes 2 (two) times daily as needed for dry eyes.     . potassium chloride SA (K-DUR,KLOR-CON) 20 MEQ tablet TAKE 1 TABLET BY MOUTH DAILY AND TAKE 1 ADDITIONAL TABLET WHEN TAKING EXTRA LASIX DOSE (Patient taking differently: Take 20 mEq by mouth daily. ) 60 tablet 6  . prednisoLONE acetate (PRED FORTE) 1 % ophthalmic suspension PLACE 1 GTT INTO OU QID FOR 1 WEEK THEN BID FOR 1 WEEK    . Rivaroxaban (XARELTO) 15 MG TABS tablet Take 15 mg by mouth  daily.    Marland Kitchen STIOLTO RESPIMAT 2.5-2.5 MCG/ACT AERS Inhale 2 puffs into the lungs daily.    . Fluticasone Furoate (ARNUITY ELLIPTA) 100  MCG/ACT AEPB Inhale 1 puff into the lungs daily. (Patient not taking: Reported on 10/04/2019) 2 each 0  . Fluticasone-Umeclidin-Vilant (TRELEGY ELLIPTA) 100-62.5-25 MCG/INH AEPB Inhale 1 puff into the lungs daily. (Patient not taking: Reported on 10/04/2019) 28 each 0  . lidocaine (LIDODERM) 5 % Place 1 patch onto the skin daily. Remove & Discard patch within 12 hours or as directed by MD (Patient not taking: Reported on 10/04/2019) 30 patch 0   Facility-Administered Medications Ordered in Other Encounters  Medication Dose Route Frequency Provider Last Rate Last Admin  . 0.9 %  sodium chloride infusion (Manually program via Guardrails IV Fluids)  250 mL Intravenous Once Heath Lark, MD   Stopped at 12/16/18 1500    Allergies as of 10/04/2019 - Review Complete 10/04/2019  Allergen Reaction Noted  . Codeine Itching     Past Medical History:  Diagnosis Date  . Anxiety   . Chronic blood loss anemia    03-04-2018 diverticular bleed and rectal bleeding,  transfused 2 units PRBCs 03-08-2018  . Colitis   . COPD (chronic obstructive pulmonary disease) (HCC)    Dr. Lamonte Sakai  . Depression   . Diastolic CHF, chronic ([place])   . Diverticulosis   . Family history of colon cancer   . Fibromyalgia   . Genetic testing 04/07/2018   MyRisk (35 genes) @ [facility] - No pathogenic mutations detected  . GERD (gastroesophageal reflux disease)   . Hemorrhoids   . Hiatal hernia   . Hip pain 07/2018   Right Hip Pain  . History of rectal polyps   . History of shingles   . Hypothyroidism   . IBS (irritable bowel syndrome)   . Lymphocytic colitis    Dr. Henrene Pastor  . Malignant ascites    Admission 06/2017 abdominal s/p parencentesis 07-01-2017 2.5L, 07-08-2016  2.7L, 07-12-2017  1448m  . [place] due to chemotherapeutic drug ([place]   . On home O2   . Osteoporosis   . Ovarian cancer ([facility]    Chemotherapy - Dr. GAlvy Bimler . Paroxysmal atrial fibrillation ([place]    Xarelto stopped temporarily 03-07-2018 due to  lower GI bleed  . Pleural effusion    s/p  right thoracentesis, 02-2018 1.3L and 03-17-2018 right thoracentesis 6440m, post cxr no residual effusion  . Psoriatic arthritis ([facility]  . Schatzki's ring 2013  . Seasonal allergic rhinitis     Past Surgical History:  Procedure Laterality Date  . CARDIOVASCULAR STRESS TEST  09/23/2012   Low risk lexiscan nuclear study w/ apical thinning but no evidence of ischemia/  normal LV function and wall motion , ef 75%  . CATARACT EXTRACTION W/ INTRAOCULAR LENS  IMPLANT, BILATERAL  10/2016  . CHEST TUBE INSERTION Right 06/24/2018   Procedure: INSERTION PLEURAL DRAINAGE CATHETER;  Surgeon: GeGrace IsaacMD;  [place] Service: Thoracic;  Laterality: Right;  . COLONOSCOPY    . DEBULKING N/A 03/20/2018   Procedure: DEBULKING;  Surgeon: PhIsabel CapriceMD;  Location: [facility];  Service: Gynecology;  Laterality: N/A;  . EXAM UNDER ANESTHESIA WITH MANIPULATION OF KNEE Left 12-20-2003  dr waNoemi Chapel post TKA  . FEMUR IM NAIL Left 12/11/2013   Procedure: INTRAMEDULLARY (IM) NAIL FEMORAL;  Surgeon: FrGearlean AlfMD;  Location: [facility];  Service: Orthopedics;  Laterality: Left;  . HYSTERECTOMY ABDOMINAL  WITH SALPINGO-OOPHORECTOMY Bilateral 03/20/2018   Procedure: TOTAL HYSTERECTOMY ABDOMINAL WITH BILATERAL  SALPINGO-OOPHORECTOMY;  Surgeon: Isabel Caprice, MD;  Location: [facility];  Service: Gynecology;  Laterality: Bilateral;  . IR FLUORO GUIDE PORT INSERTION RIGHT  07/22/2017  . IR PARACENTESIS  07/12/2017  . IR THORACENTESIS ASP PLEURAL SPACE W/IMG GUIDE  03/17/2018  . IR THORACENTESIS ASP PLEURAL SPACE W/IMG GUIDE  05/08/2018  . IR THORACENTESIS ASP PLEURAL SPACE W/IMG GUIDE  07/20/2019  . IR US GUIDE VASC ACCESS RIGHT  07/22/2017  . KNEE ARTHROSCOPY W/ LATERAL RELEASE Left 09-03-2005   dr Noemi Chapel  [facility]   w/  Lysis Adhesions,  excision loose body's  . LAPAROSCOPIC CHOLECYSTECTOMY  12-04-2010  dr zeigler  . LAPAROTOMY N/A 03/20/2018   Procedure:  EXPLORATORY LAPAROTOMY;  Surgeon: Isabel Caprice, MD;  Location: [facility];  Service: Gynecology;  Laterality: N/A;  . OMENTECTOMY N/A 03/20/2018   Procedure: OMENTECTOMY;  Surgeon: Isabel Caprice, MD;  Location: [facility];  Service: Gynecology;  Laterality: N/A;  . OTHER SURGICAL HISTORY  06/24/2018   Plurex Catheter inserted into the lungs  . TOTAL KNEE ARTHROPLASTY Left 09-08-2003   dr Noemi Chapel  [facility]  . TOTAL KNEE REVISION  08/06/2012   Procedure: TOTAL KNEE REVISION;  Surgeon: [name], MD;  Location[facility];  Service: Orthopedics;  Laterality: Left;  Left Total Knee Arthroplasty Revision  . TRANSTHORACIC ECHOCARDIOGRAM  08/20/2017   ef 60-65%,  grade 1 diastolic dysfunction/  trivial AR and TR  . Uterine polypectomy      Family History  Problem Relation Age of Onset  . Heart disease Father        pacemaker  . Lung cancer Father        smoker; deceased 94  . Colon cancer Mother 43       2nd rectal ca at 31  . Colon cancer Maternal Grandfather        dx 73s; deceased 42s  . Depression Sister   . Leukemia Maternal Aunt        deceased 46    Social History   Socioeconomic History  . Marital status: Divorced    Spouse name: Not on file  . Number of children: 1  . Years of education: 4  . Highest education level: GED or equivalent  Occupational History  . Occupation: retired    Fish farm manager: RETIRED  Tobacco Use  . Smoking status: Former Smoker    Packs/day: 1.00    Years: 20.00    Pack years: 20.00    Types: Cigarettes    Quit date: 10/08/1980    Years since quitting: 39.0  . Smokeless tobacco: Never Used  Substance and Sexual Activity  . Alcohol use: Not Currently    Comment: rarely, 12-23-15 rarely  . Drug use: No  . Sexual activity: Not Currently  Other Topics Concern  . Not on file  Social History Narrative   Patient lives at home by herself.    Patient is retired.    Patient has 12 th grade education.          Social Determinants of Health   Financial  Resource Strain:   . Difficulty of Paying Living Expenses: Not on file  Food Insecurity:   . Worried About Charity fundraiser in the Last Year: Not on file  . Ran Out of Food in the Last Year: Not on file  Transportation Needs:   . Lack of Transportation (Medical): Not on file  .  Lack of Transportation (Non-Medical): Not on file  Physical Activity:   . Days of Exercise per Week: Not on file  . Minutes of Exercise per Session: Not on file  Stress:   . Feeling of Stress : Not on file  Social Connections:   . Frequency of Communication with Friends and Family: Not on file  . Frequency of Social Gatherings with Friends and Family: Not on file  . Attends Religious Services: Not on file  . Active Member of Clubs or Organizations: Not on file  . Attends Archivist Meetings: Not on file  . Marital Status: Not on file  Intimate Partner Violence:   . Fear of Current or Ex-Partner: Not on file  . Emotionally Abused: Not on file  . Physically Abused: Not on file  . Sexually Abused: Not on file     ROS:  General: Negative for  weight loss, fever, chills, fatigue, +weakness. See hpi Eyes: Negative for vision changes.  ENT: Negative for hoarseness, difficulty swallowing , nasal congestion. CV: Negative for chest pain, angina, palpitations, dyspnea on exertion, peripheral edema.  Respiratory: Negative for dyspnea at rest, +dyspnea on exertion, cough, sputum, wheezing.  GI: See history of present illness. GU:  Negative for dysuria, hematuria, urinary incontinence, urinary frequency, nocturnal urination.  MS: Negative for joint pain, low back pain.  Derm: Negative for rash or itching.  Neuro: Negative for weakness, abnormal sensation, seizure, frequent headaches, memory loss, confusion.  Psych: Negative for anxiety, depression, suicidal ideation, hallucinations.  Endo: Negative for unusual weight change.  Heme: Negative for bruising or bleeding. Allergy: Negative for rash or  hives.       Physical Examination: Vital signs in last 24 hours: Temp:  [97.9 F (36.6 C)-98.2 F (36.8 C)] 97.9 F (36.6 C) (12/27 2300) Pulse Rate:  [83-95] 83 (12/28 0100) Resp:  [11-27] 25 (12/28 0730) BP: (103-131)/(45-78) 124/54 (12/28 0730) SpO2:  [93 %-100 %] 100 % (12/28 0100) Weight:  [72.6 kg] 72.6 kg (12/27 1147)    General: chronically ill appearing elderly female in no acute distress.  Head: Normocephalic, atraumatic.   Eyes: Conjunctiva pale, no icterus. Mouth: Masked. Neck: Supple without thyromegaly, masses, or lymphadenopathy.  Lungs: scattered rhonchi bilaterally.  Heart: Regular rate and rhythm, no murmurs rubs or gallops.  Abdomen: Bowel sounds are normal, slightly distended but soft, no hepatosplenomegaly or masses, no abdominal bruits or    hernia , no rebound or guarding.   Rectal: done in ED Extremities: trace bilateral lower extremity edema. No clubbing, deformity.  Neuro: Alert and oriented x 4 , grossly normal neurologically.  Skin: Warm and dry, no rash or jaundice.   Psych: Alert and cooperative, normal mood and affect.        Intake/Output from previous day: No intake/output data recorded. Intake/Output this shift: No intake/output data recorded.  Lab Results: CBC Recent Labs    10/04/19 1307 10/04/19 2111 10/05/19 0452  WBC 10.0 8.4 8.4  HGB 8.7* 7.9* 8.0*  HCT 29.8* 26.8* 28.8*  MCV 80.1 80.0 80.4  PLT 366 305 323   BMET Recent Labs    10/04/19 1307 10/05/19 0452  NA 133* 134*  K 3.8 3.7  CL 96* 97*  CO2 29 28  GLUCOSE 99 64*  BUN 18 15  CREATININE 1.12* 0.91  CALCIUM 7.6* 7.7*   LFT Recent Labs    10/04/19 1307 10/05/19 0452  BILITOT 0.5 0.8  ALKPHOS 51 44  AST 25 24  ALT 16 15  PROT 5.9* 5.3*  ALBUMIN 2.3* 2.1*    Lipase No results for input(s): LIPASE in the last 72 hours.  PT/INR Recent Labs    10/04/19 1307  LABPROT 29.6*  INR 2.8*      Imaging Studies: DG Chest 2 View  Result Date:  09/17/2019 CLINICAL DATA:  Shortness of breath EXAM: CHEST - 2 VIEW COMPARISON:  07/30/2019 FINDINGS: RIGHT jugular Port-A-Cath with tip projecting over cavoatrial junction. Normal heart size, mediastinal contours, and pulmonary vascularity. Atherosclerotic calcification aorta. Pleural effusion and subsegmental atelectasis at RIGHT base. Tiny pleural effusion and minimal LEFT basilar atelectasis. Underlying emphysematous changes. No acute infiltrate or pneumothorax. Bones demineralized. IMPRESSION: Emphysematous changes with bibasilar effusions and atelectasis greater on RIGHT. Aortic Atherosclerosis (ICD10-I70.0). Electronically Signed   By: Lavonia Dana M.D.   On: 09/17/2019 16:24  [4 week]   Impression: Pleasant 85 y/o female with multiple comorbidities, including ovarian cancer with chemotherapy on hold due to poor performance status, presenting with acute onset painless large volume hematochezia in the setting of Xarelto. History of suspected diverticular bleeding in 02/2018 treated conservatively. Last colonoscopy in 2011, EGD in 2013 as outlined above.   Suspect recurrent diverticular bleeding based on presentation. She has dropped her hemoglobin from baseline 9.4 to 8.0 but Hgb appears to have stabilized. She had documented large brown nonbloody stool this morning. Last Xarelto 36 hours ago.   Noted recent call in by daughter, [name], inquiring about possibility of Hospice bed or placement due to significant weakness and inability to provide ongoing care at home.   Plan: 1. Anticipate supportive therapy at this point, holding off on colonoscopy. If ongoing active bleeding or significant decline in Hgb, would consider intervention at that time.   2. Continue to hold Xarelto for now. 3. Serial Hgb X 4.  We would like to thank you for the opportunity to participate in the care of SARAJEAN DESSERT.  Laureen Ochs. Bernarda Caffey [facility] [phone] 12/28/202010:23  AM     LOS: 0 days

## 2019-10-05 NOTE — Telephone Encounter (Signed)
I will defer to hospitalist for management

## 2019-10-05 NOTE — Progress Notes (Signed)
PROGRESS NOTE [facility]   [name]  LKG:[id number]  DOB: Jun 03, 1933  DOA: 10/04/2019 PCP: Asencion Noble, MD   Brief Admission Hx: 85 y.o. female with medical history significant of COPD on home oxygen at 2 L, ovarian cancer who is currently undergoing chemotherapy, chronic kidney disease stage III, atrial fibrillation on anticoagulation, presents to the hospital with complaints of rectal bleeding.  She reports that for the past week, she has had progressive weakness.  She started to have multiple bowel movements containing large amounts of dark red blood over the past 24 hours.   MDM/Assessment & Plan:   1. Acute diverticular bleeding-patient has had this in the past.  She is fully anticoagulated with Xarelto which is currently on hold.  She is on clear fluids.  She has been seen by the GI service.  We are going to monitor her closely and manage conservatively unless she has a significant bleed requiring more aggressive approach.  Transfuse as needed.  H&H ordered for the next 24 hours to follow. 2. Acute blood loss anemia on chronic anemia-hemoglobin holding stable.  She had a bowel movement this morning but there was no blood seen.  Continue to hold Xarelto for now.  Type and cross and transfuse as needed. 3. Chronic atrial fibrillation-patient had RVR response when getting up to the bathroom this morning, did not have diltiazem dose from last night, we resumed her home oral diltiazem and provided IV Lopressor as needed and heart rate is improved.  Continue to follow.  As noted above Xarelto on hold. 4. COPD with chronic respiratory failure-bronchodilators ordered as needed.  Continue supplemental oxygen 2 L/min which is her baseline. 5. Stage IIIa CKD-her creatinine is slightly improved with hydration continue to follow. 6. Left ovarian epithelial cancer-she is being actively treated by the oncology service with chemotherapy.  Outpatient follow-up after  discharge. 7. Hypothyroidism-resume home levothyroxine, would not check TSH at this time.  Follow-up outpatient. 8. Generalized weakness and physical debility-PT evaluation recommended.   DVT prophylaxis: SCDs Code Status: Full Family Communication: Patient updated at bedside, verbalized understanding Disposition Plan: Inpatient   Consultants:  GI service  Procedures:  Anoscopy done in the ED 10/04/2019  Antimicrobials:  N/A  Subjective: Patient reports that she had some shortness of breath after getting up to go to the bathroom this morning and was having some palpitations.  Objective: Vitals:   10/05/19 1130 10/05/19 1200 10/05/19 1215 10/05/19 1218  BP: (!) 113/57 (!) 104/53    Pulse: (!) 101  (!) 103   Resp: 17 18 20    Temp:    98.5 F (36.9 C)  TempSrc:    Oral  SpO2: 96%  100%   Weight:      Height:       No intake or output data in the 24 hours ending 10/05/19 1315 Filed Weights   10/04/19 1147  Weight: 72.6 kg     REVIEW OF SYSTEMS  As per history otherwise all reviewed and reported negative  Exam:  General exam: Chronically ill-appearing female she is lying in the bed she is awake and alert and oriented x3 in no apparent distress. Respiratory system: Clear. No increased work of breathing. Cardiovascular system: Irregularly irregular tachycardic normal S1 & S2 heard. No JVD, murmurs, gallops, clicks or pedal edema. Gastrointestinal system: Abdomen is nondistended, soft and nontender. Normal bowel sounds heard. Central nervous system: Alert and oriented. No focal neurological deficits. Extremities: no CCE.  Data Reviewed: Basic Metabolic Panel:  Recent Labs  Lab 10/04/19 1307 10/05/19 0452  NA 133* 134*  K 3.8 3.7  CL 96* 97*  CO2 29 28  GLUCOSE 99 64*  BUN 18 15  CREATININE 1.12* 0.91  CALCIUM 7.6* 7.7*   Liver Function Tests: Recent Labs  Lab 10/04/19 1307 10/05/19 0452  AST 25 24  ALT 16 15  ALKPHOS 51 44  BILITOT 0.5 0.8   PROT 5.9* 5.3*  ALBUMIN 2.3* 2.1*   No results for input(s): LIPASE, AMYLASE in the last 168 hours. No results for input(s): AMMONIA in the last 168 hours. CBC: Recent Labs  Lab 10/04/19 1307 10/04/19 2111 10/05/19 0452 10/05/19 1108  WBC 10.0 8.4 8.4  --   NEUTROABS 7.4  --   --   --   HGB 8.7* 7.9* 8.0* 8.5*  HCT 29.8* 26.8* 28.8* 29.2*  MCV 80.1 80.0 80.4  --   PLT 366 305 323  --    Cardiac Enzymes: No results for input(s): CKTOTAL, CKMB, CKMBINDEX, TROPONINI in the last 168 hours. CBG (last 3)  No results for input(s): GLUCAP in the last 72 hours. No results found for this or any previous visit (from the past 240 hour(s)).   Studies: No results found.   Scheduled Meds: . dicyclomine  10 mg Oral BID  . diltiazem  180 mg Oral BID  . dronabinol  2.5 mg Oral BID AC  . DULoxetine  60 mg Oral QHS  . Fluticasone Furoate  1 puff Inhalation Daily  . levothyroxine  137 mcg Oral QAC breakfast  . prednisoLONE acetate  2 drop Both Eyes QID   Continuous Infusions: . sodium chloride 75 mL/hr at 10/05/19 1059   Active Problems:   Hypothyroidism   COPD (chronic obstructive pulmonary disease) (HCC)   Left ovarian epithelial cancer (HCC)   Anemia, chronic disease   Lower GI bleeding   CKD (chronic kidney disease) stage 3, GFR 30-59 ml/min   Chronic atrial fibrillation (HCC)   Chronic respiratory failure with hypoxia (HCC)   GI bleeding  Time spent:   Irwin Brakeman, MD [facility] 10/05/2019, 1:15 PM    LOS: 0 days  How to contact the [facility] Attending or Consulting provider [place] or covering provider during after hours [place], for this patient?  1. Check the care team in [facility] and look for a) attending/consulting [facility] provider listed and b) the [facility] team listed 2. Log into [email or web address] and use [place]'s universal password to access. If you do not have the password, please contact the hospital operator. 3. Locate the [facility] provider you are looking for under  [facility] and page to a number that you can be directly reached. 4. If you still have difficulty reaching the provider, please page the [facility] (Director on Call) for the Hospitalists listed on amion for assistance.

## 2019-10-05 NOTE — Significant Event (Signed)
10/05/2019 8:18 AM  HR went into 150s after ambulating to bathroom, ordered lopressor 5 mg IV x 1, reordered home dose cardizem.  Will follow.  Murvin Natal MD

## 2019-10-05 NOTE — Telephone Encounter (Signed)
Called and given below message. [name] verbalized understanding.

## 2019-10-06 ENCOUNTER — Encounter (HOSPITAL_COMMUNITY): Payer: Self-pay | Admitting: Internal Medicine

## 2019-10-06 DIAGNOSIS — K625 Hemorrhage of anus and rectum: Secondary | ICD-10-CM

## 2019-10-06 DIAGNOSIS — C569 Malignant neoplasm of unspecified ovary: Secondary | ICD-10-CM

## 2019-10-06 DIAGNOSIS — I5032 Chronic diastolic (congestive) heart failure: Secondary | ICD-10-CM

## 2019-10-06 DIAGNOSIS — I4891 Unspecified atrial fibrillation: Secondary | ICD-10-CM

## 2019-10-06 DIAGNOSIS — D500 Iron deficiency anemia secondary to blood loss (chronic): Secondary | ICD-10-CM

## 2019-10-06 DIAGNOSIS — Z7189 Other specified counseling: Secondary | ICD-10-CM

## 2019-10-06 DIAGNOSIS — D6489 Other specified anemias: Secondary | ICD-10-CM

## 2019-10-06 DIAGNOSIS — K922 Gastrointestinal hemorrhage, unspecified: Secondary | ICD-10-CM

## 2019-10-06 DIAGNOSIS — Z515 Encounter for palliative care: Secondary | ICD-10-CM

## 2019-10-06 LAB — CBC
HCT: 29.2 % — ABNORMAL LOW (ref 36.0–46.0)
Hemoglobin: 8.3 g/dL — ABNORMAL LOW (ref 12.0–15.0)
MCH: 22.7 pg — ABNORMAL LOW (ref 26.0–34.0)
MCHC: 28.4 g/dL — ABNORMAL LOW (ref 30.0–36.0)
MCV: 79.8 fL — ABNORMAL LOW (ref 80.0–100.0)
Platelets: 334 10*3/uL (ref 150–400)
RBC: 3.66 MIL/uL — ABNORMAL LOW (ref 3.87–5.11)
RDW: 19.2 % — ABNORMAL HIGH (ref 11.5–15.5)
WBC: 8.9 10*3/uL (ref 4.0–10.5)
nRBC: 0 % (ref 0.0–0.2)

## 2019-10-06 LAB — BASIC METABOLIC PANEL
Anion gap: 7 (ref 5–15)
BUN: 14 mg/dL (ref 8–23)
CO2: 27 mmol/L (ref 22–32)
Calcium: 7.6 mg/dL — ABNORMAL LOW (ref 8.9–10.3)
Chloride: 101 mmol/L (ref 98–111)
Creatinine, Ser: 0.95 mg/dL (ref 0.44–1.00)
GFR calc Af Amer: >60 mL/min (ref >60)
GFR calc non Af Amer: 55 mL/min — ABNORMAL LOW (ref >60)
Glucose, Bld: 88 mg/dL (ref 70–99)
Potassium: 3.8 mmol/L (ref 3.5–5.1)
Sodium: 135 mmol/L (ref 135–145)

## 2019-10-06 LAB — HEMOGLOBIN AND HEMATOCRIT, BLOOD
HCT: 30.6 % — ABNORMAL LOW (ref 36.0–46.0)
Hemoglobin: 8.8 g/dL — ABNORMAL LOW (ref 12.0–15.0)

## 2019-10-06 MED ORDER — DILTIAZEM HCL 25 MG/5ML IV SOLN
10.0000 mg | Freq: Once | INTRAVENOUS | Status: AC
  Administered 2019-10-06: 10 mg via INTRAVENOUS
  Filled 2019-10-06: qty 5

## 2019-10-06 MED ORDER — FUROSEMIDE 40 MG PO TABS
40.0000 mg | ORAL_TABLET | Freq: Every day | ORAL | Status: DC
  Administered 2019-10-07: 10:00:00 40 mg via ORAL
  Filled 2019-10-06: qty 1

## 2019-10-06 MED ORDER — LEVALBUTEROL HCL 0.63 MG/3ML IN NEBU
0.6300 mg | INHALATION_SOLUTION | Freq: Four times a day (QID) | RESPIRATORY_TRACT | Status: DC
  Administered 2019-10-06 – 2019-10-08 (×7): 0.63 mg via RESPIRATORY_TRACT
  Filled 2019-10-06 (×7): qty 3

## 2019-10-06 MED ORDER — IPRATROPIUM-ALBUTEROL 0.5-2.5 (3) MG/3ML IN SOLN: 3.0000 mL | RESPIRATORY_TRACT | Status: DC

## 2019-10-06 MED ORDER — BUDESONIDE 0.25 MG/2ML IN SUSP
0.2500 mg | Freq: Two times a day (BID) | RESPIRATORY_TRACT | Status: DC
  Administered 2019-10-06 – 2019-10-08 (×5): 0.25 mg via RESPIRATORY_TRACT
  Filled 2019-10-06 (×4): qty 2

## 2019-10-06 NOTE — Consult Note (Addendum)
Progress Note:  Subjective: Denies any further bright red blood per rectum. She had a BM this morning and states stools were dark but not black. Denies abdominal pain. Admits to nausea intermittently for the last several months which is being managed by her oncologist.  No nausea today. No regular vomiting. No reflux symptoms. Ate about 50% of her lunch and tolerated this well.   Objective: Vital signs in last 24 hours: Temp:  [97.8 F (36.6 C)] 97.8 F (36.6 C) (12/28 2218) Pulse Rate:  [93-155] 155 (12/29 0735) Resp:  [11-25] 18 (12/28 2218) BP: (104-135)/(47-83) 119/83 (12/29 0735) SpO2:  [88 %-100 %] 100 % (12/29 1245) Last BM Date: 10/05/19 General:   Alert and oriented, pleasant Head:  Normocephalic and atraumatic. Eyes:  No icterus, sclera clear. Conjuctiva pink.  Abdomen:  Bowel sounds present.  Abdomen appears somewhat distended but is soft. No HSM or hernias noted. No rebound or guarding.  Appears to have a midline hernia above the umbilicus that is reducible.  Msk:  Symmetrical without gross deformities.  Extremities: 1+ bilateral lower extremity pitting edema Neurologic:  Alert and  oriented x4;  grossly normal neurologically. Skin:  Warm and dry, intact without significant lesions.  Psych: Normal mood and affect.  Intake/Output from previous day: 12/28 0701 - 12/29 0700 In: 1854.8 [I.V.:1854.8] Out: -  Intake/Output this shift: No intake/output data recorded.  Lab Results: Recent Labs    10/04/19 2111 10/05/19 0452 10/05/19 1108 10/05/19 1650 10/06/19 0432  WBC 8.4 8.4  --   --  8.9  HGB 7.9* 8.0* 8.5* 8.1* 8.3*  HCT 26.8* 28.8* 29.2* 28.4* 29.2*  PLT 305 323  --   --  334   BMET Recent Labs    10/04/19 1307 10/05/19 0452 10/06/19 0432  NA 133* 134* 135  K 3.8 3.7 3.8  CL 96* 97* 101  CO2 29 28 27   GLUCOSE 99 64* 88  BUN 18 15 14   CREATININE 1.12* 0.91 0.95  CALCIUM 7.6* 7.7* 7.6*   LFT Recent Labs    10/04/19 1307 10/05/19 0452  PROT  5.9* 5.3*  ALBUMIN 2.3* 2.1*  AST 25 24  ALT 16 15  ALKPHOS 51 44  BILITOT 0.5 0.8   PT/INR Recent Labs    10/04/19 1307  LABPROT 29.6*  INR 2.8*   Assessment: 85 y/o female with multiple comorbidities, including ovarian cancer with chemotherapy on hold due to poor performance status, presenting on 12/27 with acute onset painless large volume hematochezia in the setting of Xarelto. On admission, patient dropped her hemoglobin from baseline 9.4-8.0 but remained stable thereafter. Hemoglobin 8.3 today. Xarelto has been on hold with last dose on the evening of 10/03/2019. History of suspected diverticular bleeding in 02/2018 treated conservatively. Last colonoscopy in 2011 with documented left colon diverticulosis and internal hemorrhoids, EGD in 2013 with Schatzki's ring and hiatal hernia.    Based on clinical presentation, suspect recent diverticular bleed.  As hemoglobin has remained stable and rectal bleeding has tapered off, will continue to hold off on colonoscopy.  If she were to develop recurrence of significant bleeding or significant decline in hemoglobin, would consider intervention at that time.   Plan: 1. Continue supportive therapy.  No indication for colonoscopy at this time.  If recurrence of significant GI bleed or significant drop in hemoglobin, will consider intervention at that time. 2. Continue to hold Xarelto for now. 3. Continue to monitor hemoglobin and any recurrence of overt GI bleeding.   LOS: 1  day    10/06/2019, 1:08 PM   Aliene Altes, [facility]

## 2019-10-06 NOTE — NC FL2 (Signed)
[place] MEDICAID [id number] LEVEL OF CARE SCREENING TOOL     IDENTIFICATION  Patient Name: [name] Birthdate: 27-Apr-1933 Sex: female Admission Date (Current Location): 10/04/2019  [facility] and [place] Number:  [employer] and Address:  [place] [address], [place]      Provider Number: [id number]  Attending Physician Name and Address:  Murlean Iba, MD  Relative Name and Phone Number:  [name]    Current Level of Care: Hospital Recommended Level of Care: [place] Prior Approval Number: [id number]  Date Approved/Denied:   PASRR Number:    Discharge Plan: SNF    Current Diagnoses: Patient Active Problem List   Diagnosis Date Noted  . Iron deficiency anemia due to chronic blood loss   . GI bleeding 10/04/2019  . Lobar pneumonia ([place]) 07/09/2019  . Atrial fibrillation ([place]) 07/09/2019  . Post herpetic neuralgia 06/26/2019  . Herpes zoster 06/05/2019  . Chronic respiratory failure with hypoxia ([place]) 05/11/2019  . Symptomatic anemia 04/26/2019  . UTI (urinary tract infection) 12/29/2018  . Mucositis due to chemotherapy 12/09/2018  . CKD (chronic kidney disease) stage 3, GFR 30-59 ml/min 10/14/2018  . Chronic atrial fibrillation ([place]) 10/14/2018  . Hypomagnesemia 09/22/2018  . Cancer associated pain 09/12/2018  . Goals of care, counseling/discussion 09/12/2018  . Other constipation 05/28/2018  . Psoriatic arthritis ([place]) 04/22/2018  . Genetic testing 04/07/2018  . Family history of colon cancer   . History of GI diverticular bleed 03/12/2018  . Lower GI bleeding   . Diverticulosis of colon with hemorrhage   . Rectal bleeding 03/04/2018  . Acute GI bleeding 03/04/2018  . Thrombocytopenia ([place]) 01/03/2018  . Anemia, chronic disease 12/13/2017  . Mild protein-calorie malnutrition ([place]) 12/13/2017  . 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 S/P thoracentesis   . Atrial  fibrillation with RVR ([place]) 11/08/2017  . HCAP (healthcare-associated pneumonia) 11/08/2017  . Pleural effusion 09/20/2017  . Acute on chronic diastolic heart failure ([place]) 08/19/2017  . Pancytopenia, acquired ([place]) 08/15/2017  . Protein-calorie malnutrition, moderate ([place]) 07/30/2017  . Generalized weakness 07/30/2017  . Antineoplastic chemotherapy induced pancytopenia ([place]) 07/20/2017  . Left ovarian epithelial cancer ([place]) 07/18/2017  . PNA (pneumonia) 07/07/2017  . Ascites 06/30/2017  . Ascites, malignant 06/30/2017  . Elevated CA-125 01/18/2017  . Depression 12/23/2015  . Peripheral edema 08/23/2015  . Fracture of hip, left, closed ([place]) 12/08/2013  . Hip fracture ([place]) 12/08/2013  . PAD (peripheral artery disease) ([place]) 11/25/2013  . Encounter for therapeutic drug monitoring 11/09/2013  . Cough 03/20/2013  . Total knee replacement status 10/07/2012  . Knee pain 10/07/2012  . Knee stiffness 10/07/2012  . Tachycardia 09/17/2012  . Chest pain 09/17/2012  . Difficulty in walking(719.7) 09/16/2012  . Muscle weakness (generalized) 09/16/2012  . Postop Acute blood loss anemia 08/08/2012  . Instability of prosthetic knee ([place]) 08/06/2012  . Bloating 02/14/2012  . Upper abdominal pain 02/14/2012  . Allergic rhinitis, seasonal 02/15/2011  . COLITIS 02/20/2010  . Diarrhea 01/02/2010  . ABDOMINAL PAIN -GENERALIZED 01/02/2010  . PERSONAL HX COLONIC POLYPS 01/02/2010  . Hypothyroidism 12/28/2009  . COPD (chronic obstructive pulmonary disease) ([place]) 12/28/2009  . Arthropathy 12/28/2009    Orientation RESPIRATION BLADDER Height & Weight     Self, Time, Situation  O2 Continent Weight: 72.6 kg Height:  5\' 4"  (162.6 cm)  BEHAVIORAL SYMPTOMS/MOOD NEUROLOGICAL BOWEL NUTRITION STATUS      Continent Diet  AMBULATORY STATUS COMMUNICATION OF  NEEDS Skin   Extensive Assist Verbally Normal                       Personal Care Assistance Level of Assistance  Bathing, Feeding,  Dressing Bathing Assistance: Limited assistance Feeding assistance: Independent Dressing Assistance: Limited assistance     Functional Limitations Info  Sight, Hearing, Speech Sight Info: Adequate Hearing Info: Adequate Speech Info: Adequate    SPECIAL CARE FACTORS FREQUENCY  PT (By licensed PT)     PT Frequency: 5x/week              Contractures Contractures Info: Not present    Additional Factors Info  Code Status, Allergies, Psychotropic Code Status Info: Full Allergies Info: Codeine Psychotropic Info: xanax, cymbalta         Current Medications (10/06/2019):  This is the current hospital active medication list Current Facility-Administered Medications  Medication Dose Route Frequency Provider Last Rate Last Admin  . 0.9 %  sodium chloride infusion   Intravenous Continuous Wynetta Emery, Clanford L, MD 30 mL/hr at 10/06/19 1555 Rate Change at 10/06/19 1555  . acetaminophen (TYLENOL) tablet 650 mg  650 mg Oral Q6H PRN Kathie Dike, MD       Or  . acetaminophen (TYLENOL) suppository 650 mg  650 mg Rectal Q6H PRN Kathie Dike, MD      . ALPRAZolam Duanne Moron) tablet 0.25 mg  0.25 mg Oral Q6H PRN Fields, Sandi L, MD   0.25 mg at 10/06/19 0557  . budesonide (PULMICORT) nebulizer solution 0.25 mg  0.25 mg Nebulization BID Johnson, Clanford L, MD   0.25 mg at 10/06/19 0750  . Chlorhexidine Gluconate Cloth 2 % PADS 6 each  6 each Topical Daily Murlean Iba, MD   6 each at 10/06/19 0954  . dicyclomine (BENTYL) capsule 10 mg  10 mg Oral BID Kathie Dike, MD   10 mg at 10/06/19 0845  . diltiazem (CARDIZEM CD) 24 hr capsule 180 mg  180 mg Oral BID Wynetta Emery, Clanford L, MD   180 mg at 10/06/19 0626  . diphenhydrAMINE (BENADRYL) capsule 25 mg  25 mg Oral Q8H PRN Kathie Dike, MD      . dronabinol (MARINOL) capsule 2.5 mg  2.5 mg Oral BID AC Kathie Dike, MD   2.5 mg at 10/06/19 0845  . DULoxetine (CYMBALTA) DR capsule 60 mg  60 mg Oral QHS Kathie Dike, MD   60 mg at  10/05/19 2123  . fluticasone (FLONASE) 50 MCG/ACT nasal spray 2 spray  2 spray Each Nare BID PRN Kathie Dike, MD   2 spray at 10/05/19 1018  . [START ON 10/07/2019] furosemide (LASIX) tablet 40 mg  40 mg Oral Daily Strader, [place] M, PA-C      . hydrocortisone cream 1 %   Topical BID PRN Johnson, Clanford L, MD      . levalbuterol (XOPENEX) nebulizer solution 0.63 mg  0.63 mg Nebulization Q6H Johnson, Clanford L, MD   0.63 mg at 10/06/19 1406  . levothyroxine (SYNTHROID) tablet 137 mcg  137 mcg Oral QAC breakfast Kathie Dike, MD   137 mcg at 10/06/19 0609  . metoprolol tartrate (LOPRESSOR) injection 2.5 mg  2.5 mg Intravenous Q6H PRN Johnson, Clanford L, MD      . ondansetron (ZOFRAN) tablet 4 mg  4 mg Oral Q6H PRN Kathie Dike, MD       Or  . ondansetron (ZOFRAN) injection 4 mg  4 mg Intravenous Q6H PRN Kathie Dike, MD      .  oxyCODONE (Oxy IR/ROXICODONE) immediate release tablet 10 mg  10 mg Oral Q4H PRN Kathie Dike, MD   10 mg at 10/06/19 0557  . polyvinyl alcohol (LIQUIFILM TEARS) 1.4 % ophthalmic solution 1 drop  1 drop Both Eyes PRN Johnson, Clanford L, MD      . prednisoLONE acetate (PRED FORTE) 1 % ophthalmic suspension 2 drop  2 drop Both Eyes QID Kathie Dike, MD   2 drop at 10/06/19 1413   Facility-Administered Medications Ordered in Other Encounters  Medication Dose Route Frequency Provider Last Rate Last Admin  . 0.9 %  sodium chloride infusion (Manually program via Guardrails IV Fluids)  250 mL Intravenous Once Heath Lark, MD   Stopped at 12/16/18 1500     Discharge Medications: Please see discharge summary for a list of discharge medications.  Relevant Imaging Results:  Relevant Lab Results:   Additional Information SSN [id number]  Taegen Delker, Chauncey Reading, RN

## 2019-10-06 NOTE — Consult Note (Signed)
Consultation Note Date: 10/06/2019   Patient Name: [name]  DOB: 1933-04-01  MRN: [id number]  Age / Sex: 85 y.o., female  PCP: Asencion Noble, MD Referring Physician: Murlean Iba, MD  Reason for Consultation: Establishing goals of care and Psychosocial/spiritual support  HPI/Patient Profile: 85 y.o. female  with past medical history of ovarian cancer/chemo hold dt poor performance, CHF, COPD home O2 2L, CKD3, a fib on anticoag, anxiety/depresssion, IBS admitted on 10/04/2019 with GIB.   Clinical Assessment and Goals of Care: [name] is resting quietly in bed.  She greets me making and mostly keeping eye contact.  She is alert and oriented, calm and cooperative.  She is able to make her needs known.  There is no family at bedside at this time.  We talked about her acute and chronic health concerns, in particular her GI bleed.  [name] also talks about follow-up with oncology on January 18.  She states she is not sure if she will be offered/continue chemotherapy, stating that it has been difficult for her.  [name] shares that she has been very weak and deconditioned due to this GI bleed.  She is agreeable to SNF rehab, first choice [facility]; second choice rehab at [facility].   We talked about healthcare power of attorney.  [name] states [name] is her only child.  She states [name] is caring for her own granddaughter who has left hemiplegia, aged 61, but they care for her like a toddler.  We talked about CODE STATUS, "treat the treatable but allowing natural passing".  [name] states that she would not want life support, but feels she needs to discuss this with her daughter.  PMT to follow-up tomorrow, hopefully visit with daughter, [name].   HCPOA    NEXT OF KIN - daughter [name].  Sister Penelope Galas.     SUMMARY OF RECOMMENDATIONS     Continue to treat the treatable Continue code status discussions.   Code Status/Advance Care Planning:  Full code -  We talk about "treat the treatable, but allow a natural death".  [name] is encouraged to consider her choices and discuss with her daughter.   Symptom Management:   Per hospitalist, no additional needs at this time.   Palliative Prophylaxis:   Frequent Pain Assessment and Turn Reposition  Additional Recommendations (Limitations, Scope, Preferences):  No Surgical Procedures and treat the treatable  Psycho-social/Spiritual:   Desire for further Chaplaincy support:no  Additional Recommendations: Caregiving  Support/Resources and Education on Hospice  Prognosis:   Unable to determine, based on outcomes.    Discharge Planning: agreeable to SNF rehab.       Primary Diagnoses: Present on Admission: . GI bleeding . Chronic atrial fibrillation ([place]) . Chronic respiratory failure with hypoxia ([place]) . COPD (chronic obstructive pulmonary disease) ([place]) . CKD (chronic kidney disease) stage 3, GFR 30-59 ml/min . Hypothyroidism . Left ovarian epithelial cancer ([place]) . Anemia, chronic disease   I have reviewed the medical record, interviewed  the patient and family, and examined the patient. The following aspects are pertinent.  Past Medical History:  Diagnosis Date  . Anxiety   . Chronic blood loss anemia    03-04-2018 diverticular bleed and rectal bleeding,  transfused 2 units PRBCs 03-08-2018  . Colitis   . COPD (chronic obstructive pulmonary disease) (H[facility])    Dr. Lamonte Sakai  . Depression   . Diastolic CHF, chronic ([place])   . Diverticulosis   . Family history of colon cancer   . Fibromyalgia   . Genetic testing 04/07/2018   MyRisk (35 genes) @ [facility] - No pathogenic mutations detected  . GERD (gastroesophageal reflux disease)   . Hemorrhoids   . Hiatal hernia   . Hip pain 07/2018   Right Hip Pain  . History of rectal polyps   . History of shingles    . Hypothyroidism   . IBS (irritable bowel syndrome)   . Lymphocytic colitis    Dr. Henrene Pastor  . Malignant ascites    Admission 06/2017 abdominal s/p parencentesis 07-01-2017 2.5L, 07-08-2016  2.7L, 07-12-2017  1425m  . Neuropathy due to chemotherapeutic drug ([place]   . On home O2   . Osteoporosis   . Ovarian cancer ([facility]    Chemotherapy - Dr. GAlvy Bimler . Paroxysmal atrial fibrillation ([place]    Xarelto stopped temporarily 03-07-2018 due to lower GI bleed  . Pleural effusion    s/p  right thoracentesis, 02-2018 1.3L and 03-17-2018 right thoracentesis 6462m, post cxr no residual effusion  . Psoriatic arthritis ([place]  . Schatzki's ring 2013  . Seasonal allergic rhinitis    Social History   Socioeconomic History  . Marital status: Divorced    Spouse name: Not on file  . Number of children: 1  . Years of education: 1253. Highest education level: GED or equivalent  Occupational History  . Occupation: retired    EmFish farm managerRETIRED  Tobacco Use  . Smoking status: Former Smoker    Packs/day: 1.00    Years: 20.00    Pack years: 20.00    Types: Cigarettes    Quit date: 10/08/1980    Years since quitting: 39.0  . Smokeless tobacco: Never Used  Substance and Sexual Activity  . Alcohol use: Not Currently    Comment: rarely, 12-23-15 rarely  . Drug use: No  . Sexual activity: Not Currently  Other Topics Concern  . Not on file  Social History Narrative   Patient lives at home by herself.    Patient is retired.    Patient has 12 th grade education.          Social Determinants of Health   Financial Resource Strain:   . Difficulty of Paying Living Expenses: Not on file  Food Insecurity:   . Worried About RuCharity fundraisern the Last Year: Not on file  . Ran Out of Food in the Last Year: Not on file  Transportation Needs:   . Lack of Transportation (Medical): Not on file  . Lack of Transportation (Non-Medical): Not on file  Physical Activity:   . Days of Exercise per Week:  Not on file  . Minutes of Exercise per Session: Not on file  Stress:   . Feeling of Stress : Not on file  Social Connections:   . Frequency of Communication with Friends and Family: Not on file  . Frequency of Social Gatherings with Friends and Family: Not on file  . Attends Religious Services: Not on file  .  Active Member of Clubs or Organizations: Not on file  . Attends Archivist Meetings: Not on file  . Marital Status: Not on file   Family History  Problem Relation Age of Onset  . Heart disease Father        pacemaker  . Lung cancer Father        smoker; deceased 38  . Colon cancer Mother 52       2nd rectal ca at 18  . Colon cancer Maternal Grandfather        dx 23s; deceased 57s  . Depression Sister   . Leukemia Maternal Aunt        deceased 36   Scheduled Meds: . budesonide (PULMICORT) nebulizer solution  0.25 mg Nebulization BID  . Chlorhexidine Gluconate Cloth  6 each Topical Daily  . dicyclomine  10 mg Oral BID  . diltiazem  180 mg Oral BID  . dronabinol  2.5 mg Oral BID AC  . DULoxetine  60 mg Oral QHS  . [START ON 10/07/2019] furosemide  40 mg Oral Daily  . levalbuterol  0.63 mg Nebulization Q6H  . levothyroxine  137 mcg Oral QAC breakfast  . prednisoLONE acetate  2 drop Both Eyes QID   Continuous Infusions: . sodium chloride 75 mL/hr at 10/06/19 1412   PRN Meds:.acetaminophen **OR** acetaminophen, ALPRAZolam, diphenhydrAMINE, fluticasone, hydrocortisone cream, metoprolol tartrate, ondansetron **OR** ondansetron (ZOFRAN) IV, oxyCODONE, polyvinyl alcohol Medications Prior to Admission:  Prior to Admission medications   Medication Sig Start Date End Date Taking? Authorizing Provider  albuterol (PROAIR HFA) 108 (90 Base) MCG/ACT inhaler Inhale 2 puffs into the lungs every 6 (six) hours as needed for wheezing or shortness of breath. 04/01/19  Yes Byrum, Rose Fillers, MD  ALPRAZolam Duanne Moron) 0.25 MG tablet Take 1 tablet (0.25 mg total) by mouth at bedtime as  needed for anxiety. 11/15/17  Yes Amin, Ankit Chirag, MD  azelastine (ASTELIN) 0.1 % nasal spray Place 2 sprays into both nostrils 2 (two) times daily as needed for allergies.    Yes [provider]  Cholecalciferol (VITAMIN D) 2000 units CAPS Take 2,000 Units by mouth daily.    Yes [provider]  dicyclomine (BENTYL) 10 MG capsule TAKE 1 CAPSULE(10 MG) BY MOUTH TWICE DAILY 09/07/19  Yes [name], MD  diltiazem (CARDIZEM CD) 180 MG 24 hr capsule Take 1 capsule (180 mg total) by mouth 2 (two) times a day. 04/28/19  Yes Satira Sark, MD  diphenhydrAMINE (BENADRYL) 25 mg capsule Take 25 mg by mouth every 8 (eight) hours as needed for itching.    Yes [provider]  dronabinol (MARINOL) 2.5 MG capsule Take 1 capsule (2.5 mg total) by mouth 2 (two) times daily before a meal. 09/01/19  Yes Gorsuch, Ni, MD  DULoxetine (CYMBALTA) 60 MG capsule Take 1 capsule (60 mg total) by mouth at bedtime. 09/07/19  Yes Gorsuch, Ni, MD  fluticasone (FLONASE) 50 MCG/ACT nasal spray Place 2 sprays into both nostrils 2 (two) times daily as needed (FOR NASAL CONGESTION.).  11/20/13  Yes [provider]  furosemide (LASIX) 40 MG tablet Take 1 tablet (40 mg total) by mouth daily. 10/13/18  Yes Satira Sark, MD  halobetasol (ULTRAVATE) 0.05 % cream Apply 1 application topically 2 (two) times daily as needed (psoriasis).    Yes [provider]  hydrocortisone valerate cream (WESTCORT) 0.2 % Apply 1 application topically 2 (two) times daily. 06/25/19  Yes Harle Stanford., PA-C  levothyroxine (SYNTHROID,  LEVOTHROID) 137 MCG tablet Take 137 mcg by mouth daily before breakfast. For thyroid therapy   Yes [provider]  Magnesium 400 MG CAPS Take 400 mg by mouth daily.    Yes [provider]  Oxycodone HCl 10 MG TABS Take 1 tablet (10 mg total) by mouth every 4 (four) hours as needed. 09/01/19  Yes Gorsuch, Ni, MD  polyvinyl alcohol (LIQUIFILM TEARS) 1.4 %  ophthalmic solution Place 1 drop into both eyes 2 (two) times daily as needed for dry eyes.    Yes [provider]  potassium chloride SA (K-DUR,KLOR-CON) 20 MEQ tablet TAKE 1 TABLET BY MOUTH DAILY AND TAKE 1 ADDITIONAL TABLET WHEN TAKING EXTRA LASIX DOSE Patient taking differently: Take 20 mEq by mouth daily.  11/10/18  Yes Satira Sark, MD  prednisoLONE acetate (PRED FORTE) 1 % ophthalmic suspension PLACE 1 GTT INTO OU QID FOR 1 WEEK THEN BID FOR 1 WEEK 08/17/19  Yes [provider]  Rivaroxaban (XARELTO) 15 MG TABS tablet Take 15 mg by mouth daily.   Yes [provider]  STIOLTO RESPIMAT 2.5-2.5 MCG/ACT AERS Inhale 2 puffs into the lungs daily. 09/28/19  Yes [provider]  Fluticasone Furoate (ARNUITY ELLIPTA) 100 MCG/ACT AEPB Inhale 1 puff into the lungs daily. Patient not taking: Reported on 10/04/2019 08/21/19   Martyn Ehrich, NP  Fluticasone-Umeclidin-Vilant (TRELEGY ELLIPTA) 100-62.5-25 MCG/INH AEPB Inhale 1 puff into the lungs daily. Patient not taking: Reported on 10/04/2019 09/17/19   Collene Gobble, MD  lidocaine (LIDODERM) 5 % Place 1 patch onto the skin daily. Remove & Discard patch within 12 hours or as directed by MD Patient not taking: Reported on 10/04/2019 08/21/19   Martyn Ehrich, NP  Calcium Carbonate (CALCIUM 600) 1500 MG TABS Take 2 tablets by mouth daily.    09/11/18  [provider]   Allergies  Allergen Reactions  . Codeine Itching   Review of Systems  Unable to perform ROS: Age    Physical Exam Vitals and nursing note reviewed.  Constitutional:      General: She is not in acute distress. Cardiovascular:     Rate and Rhythm: Normal rate.  Pulmonary:     Effort: Pulmonary effort is normal. No respiratory distress.  Skin:    General: Skin is warm and dry.  Neurological:     Mental Status: She is alert.  Psychiatric:     Comments: Calm and cooperative      Vital Signs: BP 119/83 (BP Location:  Left Arm)   Pulse (!) 155   Temp 97.8 F (36.6 C) (Oral)   Resp 18   Ht 5' 4" (1.626 m)   Wt 72.6 kg   SpO2 99%   BMI 27.46 kg/m  Pain Scale: 0-10   Pain Score: Asleep   SpO2: SpO2: 99 % O2 Device:SpO2: 99 % O2 Flow Rate: .O2 Flow Rate (L/min): 3 L/min  IO: Intake/output summary:   Intake/Output Summary (Last 24 hours) at 10/06/2019 1530 Last data filed at 10/05/2019 2218 Gross per 24 hour  Intake 1854.79 ml  Output --  Net 1854.79 ml    LBM: Last BM Date: 10/05/19 Baseline Weight: Weight: 72.6 kg Most recent weight: Weight: 72.6 kg     Palliative Assessment/Data:   Flowsheet Rows     Most Recent Value  Intake Tab  Referral Department  Cardiology  Unit at Time of Referral  Cardiac/Telemetry Unit  Palliative Care Primary Diagnosis  -- [GIB]  Date Notified  10/06/19  Palliative Care Type  New Palliative care  Reason for referral  Clarify Goals of Care  Date of Admission  10/04/19  Date first seen by Palliative Care  10/06/19  # of days Palliative referral response time  0 Day(s)  # of days IP prior to Palliative referral  2  Clinical Assessment  Palliative Performance Scale Score  40%  Pain Max last 24 hours  Not able to report  Pain Min Last 24 hours  Not able to report  Dyspnea Max Last 24 Hours  Not able to report  Dyspnea Min Last 24 hours  Not able to report  Psychosocial & Spiritual Assessment  Palliative Care Outcomes      Time In: 1510 Time Out: 1600  Time Total: 50 minute  Greater than 50%  of this time was spent counseling and coordinating care related to the above assessment and plan.  Signed by: Drue Novel, NP   Please contact Palliative Medicine Team phone at [phone] for questions and concerns.  For individual provider: See Shea Evans

## 2019-10-06 NOTE — Progress Notes (Signed)
Called patient's daughter, [name] to update and give room number. Pt daughter states that she is aware of the visiting hours.

## 2019-10-06 NOTE — Evaluation (Signed)
Physical Therapy Evaluation Patient Details Name: [name] MRN: [id number] DOB: 1932-11-04 Today's Date: 10/06/2019   History of Present Illness  [name] is a 85 y.o. female with medical history significant of COPD on home oxygen at 2 L, ovarian cancer who is currently undergoing chemotherapy, chronic kidney disease stage III, atrial fibrillation on anticoagulation, presents to the hospital with complaints of rectal bleeding.  She reports that for the past week, she has had progressive weakness.  She started to have multiple bowel movements containing large amounts of dark red blood over the past 24 hours.  She is also had mild abdominal cramping.  She has chronic shortness of breath and does not feel this is any worse than her baseline.  She is generally weak.  She is not had any vomiting.  She is not had any fever.  She has had a chronic cough which is unchanged.    Clinical Impression  Patient had difficulty sitting up at bedside due to weakness and fair/poor return for propping up on elbows to sitting, limited to ambulation in room secondary to c/o fatigue and HR increasing to 140-150 bpm.  Patient tolerated sitting up in chair after therapy - RN notified.  Patient will benefit from continued physical therapy in hospital and recommended venue below to increase strength, balance, endurance for safe ADLs and gait.     Follow Up Recommendations SNF    Equipment Recommendations  None recommended by PT    Recommendations for Other Services       Precautions / Restrictions Precautions Precautions: Fall Restrictions Weight Bearing Restrictions: No      Mobility  Bed Mobility Overal bed mobility: Needs Assistance Bed Mobility: Supine to Sit     Supine to sit: Mod assist     General bed mobility comments: slow labored movement  Transfers Overall transfer level: Needs assistance Equipment used: Rolling walker (2 wheeled) Transfers: Sit to/from [employer] Sit to Stand: Min assist Stand pivot transfers: Min assist       General transfer comment: increased time, labored movement  Ambulation/Gait Ambulation/Gait assistance: Min assist Gait Distance (Feet): 16 Feet Assistive device: Rolling walker (2 wheeled) Gait Pattern/deviations: Decreased step length - right;Decreased step length - left;Decreased stride length Gait velocity: decreased   General Gait Details: slow labored cadence without loss of balance, limited secondary to c/o fatigue and HR increasing 140-150 bpm  Stairs            Wheelchair Mobility    Modified Rankin (Stroke Patients Only)       Balance Overall balance assessment: Needs assistance Sitting-balance support: Feet supported;No upper extremity supported Sitting balance-Leahy Scale: Fair Sitting balance - Comments: fair/good seated at bedside   Standing balance support: During functional activity;Bilateral upper extremity supported Standing balance-Leahy Scale: Fair Standing balance comment: using RW                             Pertinent Vitals/Pain Pain Assessment: No/denies pain    Home Living Family/patient expects to be discharged to:: Private residence Living Arrangements: Alone Available Help at Discharge: Available PRN/intermittently;Personal care attendant;Family Type of Home: House Home Access: Stairs to enter Entrance Stairs-Rails: Right Entrance Stairs-Number of Steps: 2 in garage Rt hand rail going up [place]: One level Home Equipment: Clinical cytogeneticist - 2 wheels;Bedside commode;Wheelchair - manual;Cane - single point;Walker - 4 wheels      Prior Function Level of Independence: Independent with  assistive device(s)         Comments: household ambulator using RW, home O2 dependent 2-3 LPM     Hand Dominance   Dominant Hand: Right    Extremity/Trunk Assessment   Upper Extremity Assessment Upper Extremity Assessment: Generalized weakness     Lower Extremity Assessment Lower Extremity Assessment: Generalized weakness    Cervical / Trunk Assessment Cervical / Trunk Assessment: Normal  Communication   Communication: No difficulties  Cognition Arousal/Alertness: Awake/alert Behavior During Therapy: WFL for tasks assessed/performed Overall Cognitive Status: Within Functional Limits for tasks assessed                                        General Comments      Exercises     Assessment/Plan    PT Assessment Patient needs continued PT services  PT Problem List Decreased strength;Decreased activity tolerance;Decreased balance;Decreased mobility       PT Treatment Interventions Gait training;Stair training;Functional mobility training;Therapeutic activities;Therapeutic exercise;Patient/family education    PT Goals (Current goals can be found in the Care Plan section)  Acute Rehab PT Goals Patient Stated Goal: return home after rehab PT Goal Formulation: With patient Time For Goal Achievement: 10/20/19 Potential to Achieve Goals: Good    Frequency Min 3X/week   Barriers to discharge        Co-evaluation               AM-PAC PT "6 Clicks" Mobility  Outcome Measure Help needed turning from your back to your side while in a flat bed without using bedrails?: A Little Help needed moving from lying on your back to sitting on the side of a flat bed without using bedrails?: A Lot Help needed moving to and from a bed to a chair (including a wheelchair)?: A Little Help needed standing up from a chair using your arms (e.g., wheelchair or bedside chair)?: A Little Help needed to walk in hospital room?: A Lot Help needed climbing 3-5 steps with a railing? : A Lot 6 Click Score: 15    End of Session Equipment Utilized During Treatment: Oxygen Activity Tolerance: Patient tolerated treatment well;Patient limited by fatigue Patient left: in chair;with call bell/phone within reach Nurse  Communication: Mobility status PT Visit Diagnosis: Unsteadiness on feet (R26.81);Other abnormalities of gait and mobility (R26.89);Muscle weakness (generalized) (M62.81)    Time: 3570-1779 PT Time Calculation (min) (ACUTE ONLY): 26 min   Charges:   PT Evaluation $PT Eval Moderate Complexity: 1 Mod PT Treatments $Therapeutic Activity: 23-37 mins        10:47 AM, 10/06/19 Lonell Grandchild, MPT Physical Therapist with [facility] [phone] office [phone] mobile phone

## 2019-10-06 NOTE — TOC Initial Note (Signed)
Transition of Care [facility]) - Initial/Assessment Note    Patient Details  Name: [name] MRN: [id number] Date of Birth: 02/08/1933  Transition of Care [facility]) CM/SW Contact:    Braiden Rodman, Chauncey Reading, RN Phone Number: 10/06/2019, 3:47 PM  Clinical Narrative:   GI bleed, Afib RVR. From home alone, pays for help 5 hours a day. Recommended for SNF. Patient and daughter agreeable. Want referral to [place] facilities, [employer], and [employer] [employer].   Noted that palliative consulted for [place].               Expected Discharge Plan: Skilled Nursing Facility Barriers to Discharge: Continued Medical Work up   Patient Goals and CMS Choice Patient states their goals for this hospitalization and ongoing recovery are:: go to short term rehab and return home CMS Medicare.gov Compare Post Acute Care list provided to:: Patient(and dtr, [name]) Choice offered to / list presented to : Patient, Adult Children  Expected Discharge Plan and Services Expected Discharge Plan: [place]   Discharge Planning Services: CM Consult Post Acute Care Choice: [place] Living arrangements for the past 2 months: Single Family Home                         Prior Living Arrangements/Services Living arrangements for the past 2 months: Single Family Home Lives with:: Self(paid helper)          Need for Family Participation in Patient Care: Yes (Comment)        Activities of Daily Living Home Assistive Devices/Equipment: Shower chair without back, Environmental consultant (specify type), Wheelchair ADL Screening (condition at time of admission) Patient's cognitive ability adequate to safely complete daily activities?: Yes Is the patient deaf or have difficulty hearing?: Yes(hearing aids but doesn't wear) Does the patient have difficulty seeing, even when wearing glasses/contacts?: No Does the patient have difficulty concentrating, remembering, or making decisions?: No Patient able to  express need for assistance with ADLs?: Yes Does the patient have difficulty dressing or bathing?: Yes Independently performs ADLs?: No Communication: Independent Dressing (OT): Needs assistance Is this a change from baseline?: Pre-admission baseline Grooming: Needs assistance Is this a change from baseline?: Pre-admission baseline Feeding: Independent Bathing: Needs assistance Is this a change from baseline?: Pre-admission baseline Toileting: Needs assistance Is this a change from baseline?: Pre-admission baseline In/Out Bed: Needs assistance Is this a change from baseline?: Pre-admission baseline Walks in Home: Needs assistance Is this a change from baseline?: Pre-admission baseline Does the patient have difficulty walking or climbing stairs?: Yes Weakness of Legs: Both Weakness of Arms/Hands: None   Emotional Assessment       Orientation: : Oriented to Self, Oriented to Place, Oriented to  Time      Admission diagnosis:  GI bleeding [K92.2] Rectal bleeding [K62.5] Iron deficiency anemia due to chronic blood loss [D50.0] Patient Active Problem List   Diagnosis Date Noted  . GI bleeding 10/04/2019  . Lobar pneumonia ([place]) 07/09/2019  . Atrial fibrillation ([place]) 07/09/2019  . Post herpetic neuralgia 06/26/2019  . Herpes zoster 06/05/2019  . Chronic respiratory failure with hypoxia ([place]) 05/11/2019  . Symptomatic anemia 04/26/2019  . UTI (urinary tract infection) 12/29/2018  . Mucositis due to chemotherapy 12/09/2018  . CKD (chronic kidney disease) stage 3, GFR 30-59 ml/min 10/14/2018  . Chronic atrial fibrillation ([place]) 10/14/2018  . Hypomagnesemia 09/22/2018  . Cancer associated pain 09/12/2018  . Goals of care, counseling/discussion 09/12/2018  . Other constipation 05/28/2018  . Psoriatic  arthritis ([place]) 04/22/2018  . Genetic testing 04/07/2018  . Family history of colon cancer   . History of GI diverticular bleed 03/12/2018  . Lower GI bleeding   .  Diverticulosis of colon with hemorrhage   . Rectal bleeding 03/04/2018  . Acute GI bleeding 03/04/2018  . Thrombocytopenia ([place]) 01/03/2018  . Anemia, chronic disease 12/13/2017  . Mild protein-calorie malnutrition ([place]) 12/13/2017  . Peripheral neuropathy due to chemotherapy ([place]) 12/13/2017  . S/P thoracentesis   . Atrial fibrillation with RVR ([place]) 11/08/2017  . HCAP (healthcare-associated pneumonia) 11/08/2017  . Pleural effusion 09/20/2017  . Acute on chronic diastolic heart failure ([place]) 08/19/2017  . Pancytopenia, acquired ([place]) 08/15/2017  . Protein-calorie malnutrition, moderate ([place]) 07/30/2017  . Generalized weakness 07/30/2017  . Antineoplastic chemotherapy induced pancytopenia ([place]) 07/20/2017  . Left ovarian epithelial cancer ([place]) 07/18/2017  . PNA (pneumonia) 07/07/2017  . Ascites 06/30/2017  . Ascites, malignant 06/30/2017  . Elevated CA-125 01/18/2017  . Depression 12/23/2015  . Peripheral edema 08/23/2015  . Fracture of hip, left, closed ([place]) 12/08/2013  . Hip fracture ([place]) 12/08/2013  . PAD (peripheral artery disease) ([place]) 11/25/2013  . Encounter for therapeutic drug monitoring 11/09/2013  . Cough 03/20/2013  . Total knee replacement status 10/07/2012  . Knee pain 10/07/2012  . Knee stiffness 10/07/2012  . Tachycardia 09/17/2012  . Chest pain 09/17/2012  . Difficulty in walking(719.7) 09/16/2012  . Muscle weakness (generalized) 09/16/2012  . Postop Acute blood loss anemia 08/08/2012  . Instability of prosthetic knee ([place]) 08/06/2012  . Bloating 02/14/2012  . Upper abdominal pain 02/14/2012  . Allergic rhinitis, seasonal 02/15/2011  . COLITIS 02/20/2010  . Diarrhea 01/02/2010  . ABDOMINAL PAIN -GENERALIZED 01/02/2010  . PERSONAL HX COLONIC POLYPS 01/02/2010  . Hypothyroidism 12/28/2009  . COPD (chronic obstructive pulmonary disease) ([place]) 12/28/2009  . Arthropathy 12/28/2009   PCP:  Asencion Noble, MD Pharmacy:   [facility], [address] [place] [address] Phone: [phone] Fax: [phone]  [place], [place] - [address] [place] [address] Phone: [phone] Fax: [phone]     Social Determinants of Health (SDOH) Interventions    Readmission Risk Interventions No flowsheet data found.

## 2019-10-06 NOTE — Progress Notes (Signed)
PROGRESS NOTE [facility]   YOHANNA TOW  ZHY:[id number]  DOB: 02-25-1933  DOA: 10/04/2019 PCP: Asencion Noble, MD   Brief Admission Hx: 85 y.o. female with medical history significant of COPD on home oxygen at 2 L, ovarian cancer who is currently undergoing chemotherapy, chronic kidney disease stage III, atrial fibrillation on anticoagulation, presents to the hospital with complaints of rectal bleeding.  She reports that for the past week, she has had progressive weakness.  She started to have multiple bowel movements containing large amounts of dark red blood over the past 24 hours.   MDM/Assessment & Plan:   1. Acute diverticular bleeding-patient has had this in the past.  She had been fully anticoagulated with Xarelto which is currently on hold.  She is tolerating diet well.  She has been seen by the GI service.  We are going to monitor her closely and manage conservatively unless she has a significant bleed requiring more aggressive approach.  Transfuse as needed.  Follow.  2. Acute blood loss anemia on chronic anemia-hemoglobin holding stable.  She had a bowel movement this morning but there was no blood seen.  Continue to hold Xarelto for now.  Type and cross and transfuse as needed.  CBC in AM.  3. Chronic atrial fibrillation-patient had RVR response when getting up to the bathroom this morning, did not have diltiazem dose from last night, we resumed her home oral diltiazem and provided IV Lopressor as needed and heart rate is improved.  Continue to follow.  As noted above Xarelto on hold. 4. COPD with chronic respiratory failure-bronchodilators ordered as needed.  Continue supplemental oxygen 2 L/min which is her baseline. 5. Stage IIIa CKD-her creatinine is slightly improved with hydration continue to follow. 6. Left ovarian epithelial cancer-she is being actively treated by the oncology service with chemotherapy.  Outpatient follow-up after discharge.  FAMILY REQUESTING  PALLIATIVE CONSULT WHICH HAS BEEN REQUESTED.  FAMILY INTERESTED IN PURSUING COMFORT AND QUALITY OF LIFE VERSUS LONGEVITY.  7. Hypothyroidism-resume home levothyroxine, would not check TSH at this time.  Follow-up outpatient. 8. Generalized weakness and physical debility-PT evaluation recommended.  DVT prophylaxis: SCDs Code Status: Full Family Communication: Patient updated at bedside, verbalized understanding Disposition Plan: Inpatient  Consultants:  GI service  Procedures:  Anoscopy done in the ED 10/04/2019  Antimicrobials:  N/A  Subjective: Patient reports being SOB and wheezing, wants to use her home bronchodilator.    Objective: Vitals:   10/06/19 0735 10/06/19 0750 10/06/19 1245 10/06/19 1406  BP: 119/83     Pulse: (!) 155     Resp:      Temp:      TempSrc:      SpO2:  99% 100% 99%  Weight:      Height:        Intake/Output Summary (Last 24 hours) at 10/06/2019 1545 Last data filed at 10/05/2019 2218 Gross per 24 hour  Intake 1854.79 ml  Output --  Net 1854.79 ml   Filed Weights   10/04/19 1147  Weight: 72.6 kg   REVIEW OF SYSTEMS  As per history otherwise all reviewed and reported negative  Exam:  General exam: Chronically ill-appearing female she is lying in the bed she is awake and alert and oriented x3 in no apparent distress. Respiratory system: Clear. No increased work of breathing. Cardiovascular system: Irregularly irregular tachycardic normal S1 & S2 heard. No JVD, murmurs, gallops, clicks or pedal edema. Gastrointestinal system: Abdomen is nondistended, soft and nontender. Normal bowel  sounds heard. Central nervous system: Alert and oriented. No focal neurological deficits. Extremities: no CCE.  Data Reviewed: Basic Metabolic Panel: Recent Labs  Lab 10/04/19 1307 10/05/19 0452 10/06/19 0432  NA 133* 134* 135  K 3.8 3.7 3.8  CL 96* 97* 101  CO2 29 28 27   GLUCOSE 99 64* 88  BUN 18 15 14   CREATININE 1.12* 0.91 0.95  CALCIUM  7.6* 7.7* 7.6*   Liver Function Tests: Recent Labs  Lab 10/04/19 1307 10/05/19 0452  AST 25 24  ALT 16 15  ALKPHOS 51 44  BILITOT 0.5 0.8  PROT 5.9* 5.3*  ALBUMIN 2.3* 2.1*   No results for input(s): LIPASE, AMYLASE in the last 168 hours. No results for input(s): AMMONIA in the last 168 hours. CBC: Recent Labs  Lab 10/04/19 1307 10/04/19 2111 10/05/19 0452 10/05/19 1108 10/05/19 1650 10/06/19 0432  WBC 10.0 8.4 8.4  --   --  8.9  NEUTROABS 7.4  --   --   --   --   --   HGB 8.7* 7.9* 8.0* 8.5* 8.1* 8.3*  HCT 29.8* 26.8* 28.8* 29.2* 28.4* 29.2*  MCV 80.1 80.0 80.4  --   --  79.8*  PLT 366 305 323  --   --  334   Cardiac Enzymes: No results for input(s): CKTOTAL, CKMB, CKMBINDEX, TROPONINI in the last 168 hours. CBG (last 3)  No results for input(s): GLUCAP in the last 72 hours. Recent Results (from the past 240 hour(s))  SARS CORONAVIRUS 2 (TAT 6-24 HRS) Nasopharyngeal Nasopharyngeal Swab     Status: None   Collection Time: 10/04/19  7:46 PM   Specimen: Nasopharyngeal Swab  Result Value Ref Range Status   SARS Coronavirus 2 NEGATIVE NEGATIVE Final    Comment: (NOTE) SARS-CoV-2 target nucleic acids are NOT DETECTED. The SARS-CoV-2 RNA is generally detectable in upper and lower respiratory specimens during the acute phase of infection. Negative results do not preclude SARS-CoV-2 infection, do not rule out co-infections with other pathogens, and should not be used as the sole basis for treatment or other patient management decisions. Negative results must be combined with clinical observations, patient history, and epidemiological information. The expected result is Negative. Fact Sheet for Patients: SugarRoll.be Fact Sheet for Healthcare Providers: https://www.woods-mathews.com/ This test is not yet approved or cleared by the [place] FDA and  has been authorized for detection and/or diagnosis of SARS-CoV-2 by FDA  under an Emergency Use Authorization (EUA). This EUA will remain  in effect (meaning this test can be used) for the duration of the COVID-19 declaration under Section 56 4(b)(1) of the Act, 21 U.S.C. section 360bbb-3(b)(1), unless the authorization is terminated or revoked sooner. Performed at [facility], [place] [address], [place], [place] [address]      Studies: No results found.   Scheduled Meds: . budesonide (PULMICORT) nebulizer solution  0.25 mg Nebulization BID  . Chlorhexidine Gluconate Cloth  6 each Topical Daily  . dicyclomine  10 mg Oral BID  . diltiazem  180 mg Oral BID  . dronabinol  2.5 mg Oral BID AC  . DULoxetine  60 mg Oral QHS  . [START ON 10/07/2019] furosemide  40 mg Oral Daily  . levalbuterol  0.63 mg Nebulization Q6H  . levothyroxine  137 mcg Oral QAC breakfast  . prednisoLONE acetate  2 drop Both Eyes QID   Continuous Infusions: . sodium chloride 75 mL/hr at 10/06/19 1412   Active Problems:   Hypothyroidism  COPD (chronic obstructive pulmonary disease) (HCC)   Left ovarian epithelial cancer (HCC)   Anemia, chronic disease   Lower GI bleeding   CKD (chronic kidney disease) stage 3, GFR 30-59 ml/min   Chronic atrial fibrillation (HCC)   Chronic respiratory failure with hypoxia (HCC)   GI bleeding  Time spent:   Irwin Brakeman, MD [facility] 10/06/2019, 3:45 PM    LOS: 1 day  How to contact the [facility] Attending or Consulting provider [place] or covering provider during after hours [place], for this patient?  1. Check the care team in [facility] and look for a) attending/consulting TRH provider listed and b) the [facility] team listed 2. Log into [email or web address] and use [place]'s universal password to access. If you do not have the password, please contact the hospital operator. 3. Locate the [facility] provider you are looking for under [facility] and page to a number that you can be directly reached. 4. If you still have difficulty reaching the  provider, please page the [facility] (Director on Call) for the Hospitalists listed on amion for assistance.

## 2019-10-06 NOTE — Progress Notes (Signed)
Pt back in bed, O2 increased to 3L; O2 sats 88%. HR as high as 160. Now pt in A-fib with HR 107-120. Received verbal order from Dr. Olevia Bowens to give 1000 dose of cardizem now.

## 2019-10-06 NOTE — Plan of Care (Signed)
  Problem: Acute Rehab PT Goals(only PT should resolve) Goal: Pt Will Go Supine/Side To Sit Outcome: Progressing Flowsheets (Taken 10/06/2019 1049) Pt will go Supine/Side to Sit:  with minimal assist  with min guard assist Goal: Patient Will Transfer Sit To/From Stand Outcome: Progressing Flowsheets (Taken 10/06/2019 1049) Patient will transfer sit to/from stand:  with min guard assist  with minimal assist Goal: Pt Will Transfer Bed To Chair/Chair To Bed Outcome: Progressing Flowsheets (Taken 10/06/2019 1049) Pt will Transfer Bed to Chair/Chair to Bed:  min guard assist  with min assist Goal: Pt Will Ambulate Outcome: Progressing Flowsheets (Taken 10/06/2019 1049) Pt will Ambulate:  50 feet  with min guard assist  with minimal assist  with rolling walker   10:50 AM, 10/06/19 Lonell Grandchild, MPT Physical Therapist with [facility] [phone] office [phone] mobile phone

## 2019-10-06 NOTE — Progress Notes (Addendum)
Pt up to bedside commode with episode of tachycardia. HR as high as 160; asymptomatic and no shortness of breath. Monitoring patient.   Episodes of ST and A-flutter

## 2019-10-06 NOTE — Consult Note (Addendum)
Cardiology Consult    Patient ID: [name]; [id number]; October 05, 1933   Admit date: 10/04/2019 Date of Consult: 10/06/2019  Primary Care Provider: Asencion Noble, MD Primary Cardiologist: Rozann Lesches, MD   Patient Profile    [name] is a 85 y.o. female with past medical history of chronic diastolic CHF, paroxysmal atrial fibrillation (on Xarelto), COPD and ovarian cancer and chronic anemia who is being seen today for the evaluation of atrial fibrillation with RVR at the request of Dr. Wynetta Emery.   History of Present Illness    [name] most recently had a phone visit with Dr. Domenic Polite in 07/2019 and was overall doing well at that time. She was continued on her current medication regimen including Cardizem CD 178m BID, Lasix 418mdaily and Xarelto 1543maily. She has followed up with Oncology in the interim and by review of notes, chemotherapy is currently on hold given her progressive weakness.   [facility] for evaluation of worsening weakness and new rectal bleeding. Initial labs showed WBC 10.0, Hgb 8.7, platelets 366, Na+ 133, K+ 3.8 and creatinine 1.12. COVID negative. Occult blood positive. EKG on admission showed NSR, HR 91 with no acute ST abnormalities. GI was consulted and conservative management was recommended unless Hgb significantly declined or she had recurrent bleeding.   On 12/28, she was noted to have atrial fibrillation with RVR and she was restarted on PTA Cardizem CD 180m18mD as this was initially held on admission. Rates remained stable throughout the day but this AM she developed recurrent atrial fibrillation with RVR, HR in the 130's to 160's. Her PO Cardizem CD 180mg6m administered at 0630 and she received an IV dose of Cardizem 10mg 67m746.  She converted back to normal sinus rhythm minutes later and has been in normal sinus rhythm since with heart rate in the 60's to 70's.  In talking with the patient and her  daughter, she was overall unaware of her arrhythmia this morning. She denies any recent palpitations. Her daughter is concerned about her progressive weakness as she only has an aide present for 4 to 5 hours each day and is unable to transfer independently. She is concerned about what the plan will be at the time of discharge. The patient has expressed to her daughter she is not interested in pursuing further chemotherapy given her progressive weakness.  Past Medical History:  Diagnosis Date  . Anxiety   . Chronic blood loss anemia    03-04-2018 diverticular bleed and rectal bleeding,  transfused 2 units PRBCs 03-08-2018  . Colitis   . COPD (chronic obstructive pulmonary disease) (HCC)    Dr. Byrum Lamonte Sakaipression   . Diastolic CHF, chronic (HCC)  ParmeleeDiverticulosis   . Family history of colon cancer   . Fibromyalgia   . Genetic testing 04/07/2018   MyRisk (35 genes) @ [facility] - No pathogenic mutations detected  . GERD (gastroesophageal reflux disease)   . Hemorrhoids   . Hiatal hernia   . Hip pain 07/2018   Right Hip Pain  . History of rectal polyps   . History of shingles   . Hypothyroidism   . IBS (irritable bowel syndrome)   . Lymphocytic colitis    Dr. Perry Henrene Pastorlignant ascites    Admission 06/2017 abdominal s/p parencentesis 07-01-2017 2.5L, 07-08-2016  2.7L, 07-12-2017  1450ml  9muropathy due to chemotherapeutic drug (HCC)   [place] home O2   .  Osteoporosis   . Ovarian cancer [facility])    Chemotherapy - Dr. Alvy Bimler  . Paroxysmal atrial fibrillation ([place])    Xarelto stopped temporarily 03-07-2018 due to lower GI bleed  . Pleural effusion    s/p  right thoracentesis, 02-2018 1.3L and 03-17-2018 right thoracentesis 681m , post cxr no residual effusion  . [place]soriatic arthritis ([place]   . Schatzki's ring 2013  . Seasonal allergic rhinitis     Past Surgical History:  Procedure Laterality Date  . CARDIOVASCULAR STRESS TEST  09/23/2012   Low risk lexiscan nuclear study w/ apical  thinning but no evidence of ischemia/  normal LV function and wall motion , ef 75%  . CATARACT EXTRACTION W/ INTRAOCULAR LENS  IMPLANT, BILATERAL  10/2016  . CHEST TUBE INSERTION Right 06/24/2018   Procedure: INSERTION PLEURAL DRAINAGE CATHETER;  Surgeon: GGrace Isaac MD;  [place]  Service: Thoracic;  Laterality: Right;  . COLONOSCOPY    . DEBULKING N/A 03/20/2018   Procedure: DEBULKING;  Surgeon: PIsabel Caprice MD;  Location: [facility];  Service: Gynecology;  Laterality: N/A;  . EXAM UNDER ANESTHESIA WITH MANIPULATION OF KNEE Left 12-20-2003  dr wNoemi Chapel  post TKA  . FEMUR IM NAIL Left 12/11/2013   Procedure: INTRAMEDULLARY (IM) NAIL FEMORAL;  Surgeon: FGearlean Alf MD;  [facility];  Service: Orthopedics;  Laterality: Left;  . HYSTERECTOMY ABDOMINAL WITH SALPINGO-OOPHORECTOMY Bilateral 03/20/2018   Procedure: TOTAL HYSTERECTOMY ABDOMINAL WITH BILATERAL  SALPINGO-OOPHORECTOMY;  Surgeon: PIsabel Caprice MD;  [facility];  Service: Gynecology;  Laterality: Bilateral;  . IR FLUORO GUIDE PORT INSERTION RIGHT  07/22/2017  . IR PARACENTESIS  07/12/2017  . IR THORACENTESIS ASP PLEURAL SPACE W/IMG GUIDE  03/17/2018  . IR THORACENTESIS ASP PLEURAL SPACE W/IMG GUIDE  05/08/2018  . IR THORACENTESIS ASP PLEURAL SPACE W/IMG GUIDE  07/20/2019  . IR [place] ACCESS RIGHT  07/22/2017  . KNEE ARTHROSCOPY W/ LATERAL RELEASE Left 09-03-2005   [facility]  w/  Lysis Adhesions,  excision loose body's  . LAPAROSCOPIC CHOLECYSTECTOMY  12-04-2010  dr zeigler  . LAPAROTOMY N/A 03/20/2018   Procedure: EXPLORATORY LAPAROTOMY;  Surgeon: PIsabel Caprice MD;  [facility];  Service: Gynecology;  Laterality: N/A;  . OMENTECTOMY N/A 03/20/2018   Procedure: OMENTECTOMY;  Surgeon: PIsabel Caprice MD;  [facility];  Service: Gynecology;  Laterality: N/A;  . OTHER SURGICAL HISTORY  06/24/2018   Plurex Catheter inserted into the lungs  . TOTAL KNEE ARTHROPLASTY Left 09-08-2003   [facility] . TOTAL KNEE REVISION  08/06/2012   Procedure: TOTAL KNEE REVISION;  Surgeon: FGearlean Alf MD;  [facility];  Service: Orthopedics;  Laterality: Left;  Left Total Knee Arthroplasty Revision  . TRANSTHORACIC ECHOCARDIOGRAM  08/20/2017   ef 60-65%,  grade 1 diastolic dysfunction/  trivial AR and TR  . Uterine polypectomy       Home Medications:  Prior to Admission medications   Medication Sig Start Date End Date Taking? Authorizing Provider  albuterol (PROAIR HFA) 108 (90 Base) MCG/ACT inhaler Inhale 2 puffs into the lungs every 6 (six) hours as needed for wheezing or shortness of breath. 04/01/19  Yes Byrum, RRose Fillers MD  ALPRAZolam (Duanne Moron 0.25 MG tablet Take 1 tablet (0.25 mg total) by mouth at bedtime as needed for anxiety. 11/15/17  Yes Amin, Ankit Chirag, MD  azelastine (ASTELIN) 0.1 % nasal spray Place 2 sprays into both nostrils  2 (two) times daily as needed for allergies.    Yes [provider]  Cholecalciferol (VITAMIN D) 2000 units CAPS Take 2,000 Units by mouth daily.    Yes [provider]  dicyclomine (BENTYL) 10 MG capsule TAKE 1 CAPSULE(10 MG) BY MOUTH TWICE DAILY 09/07/19  Yes [name], MD  diltiazem (CARDIZEM CD) 180 MG 24 hr capsule Take 1 capsule (180 mg total) by mouth 2 (two) times a day. 04/28/19  Yes Satira Sark, MD  diphenhydrAMINE (BENADRYL) 25 mg capsule Take 25 mg by mouth every 8 (eight) hours as needed for itching.    Yes [provider]  dronabinol (MARINOL) 2.5 MG capsule Take 1 capsule (2.5 mg total) by mouth 2 (two) times daily before a meal. 09/01/19  Yes Gorsuch, Ni, MD  DULoxetine (CYMBALTA) 60 MG capsule Take 1 capsule (60 mg total) by mouth at bedtime. 09/07/19  Yes Gorsuch, Ni, MD  fluticasone (FLONASE) 50 MCG/ACT nasal spray Place 2 sprays into both nostrils 2 (two) times daily as needed (FOR NASAL CONGESTION.).  11/20/13  Yes [provider]  furosemide (LASIX) 40 MG tablet Take 1 tablet (40  mg total) by mouth daily. 10/13/18  Yes Satira Sark, MD  halobetasol (ULTRAVATE) 0.05 % cream Apply 1 application topically 2 (two) times daily as needed (psoriasis).    Yes [provider]  hydrocortisone valerate cream (WESTCORT) 0.2 % Apply 1 application topically 2 (two) times daily. 06/25/19  Yes Tanner, Lyndon Code., PA-C  levothyroxine (SYNTHROID, LEVOTHROID) 137 MCG tablet Take 137 mcg by mouth daily before breakfast. For thyroid therapy   Yes [provider]  Magnesium 400 MG CAPS Take 400 mg by mouth daily.    Yes [provider]  Oxycodone HCl 10 MG TABS Take 1 tablet (10 mg total) by mouth every 4 (four) hours as needed. 09/01/19  Yes Gorsuch, Ni, MD  polyvinyl alcohol (LIQUIFILM TEARS) 1.4 % ophthalmic solution Place 1 drop into both eyes 2 (two) times daily as needed for dry eyes.    Yes [provider]  potassium chloride SA (K-DUR,KLOR-CON) 20 MEQ tablet TAKE 1 TABLET BY MOUTH DAILY AND TAKE 1 ADDITIONAL TABLET WHEN TAKING EXTRA LASIX DOSE Patient taking differently: Take 20 mEq by mouth daily.  11/10/18  Yes Satira Sark, MD  prednisoLONE acetate (PRED FORTE) 1 % ophthalmic suspension PLACE 1 GTT INTO OU QID FOR 1 WEEK THEN BID FOR 1 WEEK 08/17/19  Yes [provider]  Rivaroxaban (XARELTO) 15 MG TABS tablet Take 15 mg by mouth daily.   Yes [provider]  STIOLTO RESPIMAT 2.5-2.5 MCG/ACT AERS Inhale 2 puffs into the lungs daily. 09/28/19  Yes [provider]  Fluticasone Furoate (ARNUITY ELLIPTA) 100 MCG/ACT AEPB Inhale 1 puff into the lungs daily. Patient not taking: Reported on 10/04/2019 08/21/19   Martyn Ehrich, NP  Fluticasone-Umeclidin-Vilant (TRELEGY ELLIPTA) 100-62.5-25 MCG/INH AEPB Inhale 1 puff into the lungs daily. Patient not taking: Reported on 10/04/2019 09/17/19   Collene Gobble, MD  lidocaine (LIDODERM) 5 % Place 1 patch onto the skin daily. Remove & Discard patch within 12 hours or as directed by  MD Patient not taking: Reported on 10/04/2019 08/21/19   Martyn Ehrich, NP  Calcium Carbonate (CALCIUM 600) 1500 MG TABS Take 2 tablets by mouth daily.    09/11/18  [provider]    Inpatient Medications: Scheduled Meds: . budesonide (PULMICORT) nebulizer solution  0.25 mg Nebulization BID  . Chlorhexidine  Gluconate Cloth  6 each Topical Daily  . dicyclomine  10 mg Oral BID  . diltiazem  180 mg Oral BID  . dronabinol  2.5 mg Oral BID AC  . DULoxetine  60 mg Oral QHS  . levothyroxine  137 mcg Oral QAC breakfast  . prednisoLONE acetate  2 drop Both Eyes QID   Continuous Infusions: . sodium chloride 75 mL/hr at 10/06/19 0028   PRN Meds: acetaminophen **OR** acetaminophen, albuterol, ALPRAZolam, diphenhydrAMINE, fluticasone, hydrocortisone cream, metoprolol tartrate, ondansetron **OR** ondansetron (ZOFRAN) IV, oxyCODONE, polyvinyl alcohol  Allergies:    Allergies  Allergen Reactions  . Codeine Itching    Social History:   Social History   Socioeconomic History  . Marital status: Divorced    Spouse name: Not on file  . Number of children: 1  . Years of education: 67  . Highest education level: GED or equivalent  Occupational History  . Occupation: retired    Fish farm manager: RETIRED  Tobacco Use  . Smoking status: Former Smoker    Packs/day: 1.00    Years: 20.00    Pack years: 20.00    Types: Cigarettes    Quit date: 10/08/1980    Years since quitting: 39.0  . Smokeless tobacco: Never Used  Substance and Sexual Activity  . Alcohol use: Not Currently    Comment: rarely, 12-23-15 rarely  . Drug use: No  . Sexual activity: Not Currently  Other Topics Concern  . Not on file  Social History Narrative   Patient lives at home by herself.    Patient is retired.    Patient has 12 th grade education.          Social Determinants of Health   Financial Resource Strain:   . Difficulty of Paying Living Expenses: Not on file  Food Insecurity:   . Worried About  Charity fundraiser in the Last Year: Not on file  . Ran Out of Food in the Last Year: Not on file  Transportation Needs:   . Lack of Transportation (Medical): Not on file  . Lack of Transportation (Non-Medical): Not on file  Physical Activity:   . Days of Exercise per Week: Not on file  . Minutes of Exercise per Session: Not on file  Stress:   . Feeling of Stress : Not on file  Social Connections:   . Frequency of Communication with Friends and Family: Not on file  . Frequency of Social Gatherings with Friends and Family: Not on file  . Attends Religious Services: Not on file  . Active Member of Clubs or Organizations: Not on file  . Attends Archivist Meetings: Not on file  . Marital Status: Not on file  Intimate Partner Violence:   . Fear of Current or Ex-Partner: Not on file  . Emotionally Abused: Not on file  . Physically Abused: Not on file  . Sexually Abused: Not on file     Family History:    Family History  Problem Relation Age of Onset  . Heart disease Father        pacemaker  . Lung cancer Father        smoker; deceased 31  . Colon cancer Mother 78       2nd rectal ca at 10  . Colon cancer Maternal Grandfather        dx 62s; deceased 22s  . Depression Sister   . Leukemia Maternal Aunt        deceased 18  Review of Systems    General:  No chills, fever, night sweats or weight changes.  Cardiovascular:  No chest pain, dyspnea on exertion, edema, orthopnea, palpitations, paroxysmal nocturnal dyspnea. Positive for dyspnea on exertion.  Dermatological: No rash, lesions/masses Respiratory: No cough, dyspnea Urologic: No hematuria, dysuria Abdominal:   No nausea, vomiting, diarrhea. Positive for BRBPR and melena.  Neurologic:  No visual changes or changes in mental status. Positive for weakness.   All other systems reviewed and are otherwise negative except as noted above.  Physical Exam/Data    Vitals:   10/05/19 2218 10/06/19 0559 10/06/19  0735 10/06/19 0750  BP: 135/62  119/83   Pulse: 99  (!) 155   Resp: 18     Temp: 97.8 F (36.6 C)     TempSrc: Oral     SpO2: 100% (!) 88%  99%  Weight:      Height:        Intake/Output Summary (Last 24 hours) at 10/06/2019 1028 Last data filed at 10/05/2019 2218 Gross per 24 hour  Intake 1854.79 ml  Output --  Net 1854.79 ml   Filed Weights   10/04/19 1147  Weight: 72.6 kg   Body mass index is 27.46 kg/m.   General: Pleasant elderly female appearing in NAD Psych: Normal affect. Neuro: Alert and oriented X 3. Moves all extremities spontaneously. HEENT: Normal  Neck: Supple without bruits or JVD. Lungs:  Resp regular and unlabored, expiratory wheezing along upper lung fields bilaterally. Heart: RRR no s3, s4, or murmurs. Abdomen: Soft, non-tender, non-distended, BS + x 4.  Extremities: No clubbing or cyanosis. Trace lower extremity edema bilaterally. DP/PT/Radials 2+ and equal bilaterally.   EKG:  The EKG was personally reviewed and demonstrates: Atrial fibrillation with RVR, HR 135.   Labs/Studies     Relevant CV Studies:  Echocardiogram: 08/2017 Study Conclusions  - Left ventricle: The cavity size was normal. Wall thickness was   normal. Systolic function was normal. The estimated ejection   fraction was in the range of 60% to 65%. Wall motion was normal;   there were no regional wall motion abnormalities. Doppler   parameters are consistent with abnormal left ventricular   relaxation (grade 1 diastolic dysfunction). - Aortic valve: There was no stenosis. There was trivial   regurgitation. - Mitral valve: Mildly calcified annulus. There was no significant   regurgitation. - Right ventricle: The cavity size was normal. Systolic function   was normal. - Tricuspid valve: Peak RV-RA gradient (S): 27 mm Hg. - Pulmonary arteries: PA peak pressure: 30 mm Hg (S). - Inferior vena cava: The vessel was normal in size. The   respirophasic diameter changes were in  the normal range (= 50%),   consistent with normal central venous pressure.  Impressions:  - Normal LV size with EF 60-65%. Normal RV size and systolic   function. No significant valvular abnormalities  72 Hour Event Monitor: 04/2019 72-hour event recorder reviewed.  Predominant rhythm is sinus with heart rate ranging from 55 bpm up to 119 bpm and average heart rate 89 bpm.  Periods of paroxysmal atrial fibrillation and flutter were noted, with fastest nonsustained heart rate in the 160s.  There were no spontaneous or posttermination pauses, and no unusual degree of bradycardia.  Laboratory Data:  Chemistry Recent Labs  Lab 10/04/19 1307 10/05/19 0452 10/06/19 0432  NA 133* 134* 135  K 3.8 3.7 3.8  CL 96* 97* 101  CO2 29 28 27   GLUCOSE 99 64* 88  BUN 18 15 14   CREATININE 1.12* 0.91 0.95  CALCIUM 7.6* 7.7* 7.6*  GFRNONAA 45* 58* 55*  GFRAA 52* >60 >60  ANIONGAP 8 9 7     Recent Labs  Lab 10/04/19 1307 10/05/19 0452  PROT 5.9* 5.3*  ALBUMIN 2.3* 2.1*  AST 25 24  ALT 16 15  ALKPHOS 51 44  BILITOT 0.5 0.8   Hematology Recent Labs  Lab 10/04/19 2111 10/05/19 0452 10/05/19 1108 10/05/19 1650 10/06/19 0432  WBC 8.4 8.4  --   --  8.9  RBC 3.35* 3.58*  --   --  3.66*  HGB 7.9* 8.0* 8.5* 8.1* 8.3*  HCT 26.8* 28.8* 29.2* 28.4* 29.2*  MCV 80.0 80.4  --   --  79.8*  MCH 23.6* 22.3*  --   --  22.7*  MCHC 29.5* 27.8*  --   --  28.4*  RDW 19.5* 19.3*  --   --  19.2*  PLT 305 323  --   --  334   Cardiac EnzymesNo results for input(s): TROPONINI in the last 168 hours. No results for input(s): TROPIPOC in the last 168 hours.  BNPNo results for input(s): BNP, PROBNP in the last 168 hours.  DDimer No results for input(s): DDIMER in the last 168 hours.  Radiology/Studies:  No results found.   Assessment & Plan    1. Atrial Fibrillation with RVR - she has a history of known paroxysmal atrial fibrillation and recent monitor in 04/2019 showed intermittent episodes of  atrial fibrillation/flutter with heart rate into the 160's but no significant pauses. She was in normal sinus rhythm at the time of admission but has experienced recurrent episodes of atrial fibrillation with RVR.  She was given IV Cardizem this AM with conversion back to normal sinus rhythm. Was overall asymptomatic with her episodes. - K+ 3.8 this AM. Will recheck Mg and TSH with AM labs.  - PTA Cardizem CD was initially held on admission but has been resumed. Continue at current dosing of 173m BID.   - Xarelto currently held in the setting of GIB and anemia.  2. Chronic Diastolic CHF - she does have trace edema on examination and mild rales. Will plan to restart her PTA PO Lasix 449mdaily as this was initially held.   3. COPD - currently on 3L [place] and saturations remain appropriate in the 90's.   4. Anemia/GIB - Evaluated by GI at the time of admission and conservative management was recommended unless hemoglobin significantly declined. Stable at 8.3 this AM. Agree with holding Xarelto.   5. Ovarian Cancer - followed by Oncology. She has not undergone chemotherapy in over 1 month due to progressive weakness. Her daughter is interested in discussing options available to them at the time of discharge. Will request a Palliative Care consult for goals of care discussion.    For questions or updates, please contact [facility] consult [email or web address] for contact info under Cardiology/STEMI.  Signed, BrErma HeritagePA-C 10/06/2019, 10:28 AM Pager: [phone] Attending note  Patient seen and discussed with PA StAhmed PrimaI agree with her documentation above. [age]o female history of ovarian cancer, COPD, PAF (xarelto stopped 03/07/2018 due to GI bleed, however later restarted. Admitted with recetal bleeding. Has prior history of diverticulosis. Followed by GI and hospitalist team. Xarelto is on hold. Episode of afib with RVR while dilt was held, resolved with IV dilt. Started  back on her oral dilt.   Remain off xarelto for now. From notes  family is going to discuss overall management options with palliative care. Can reassess whether to restart anticoag at outpatient f/u if bleeding resolves, of note she had a similar bleeding issue last year. Follow rates back on home dilt.BP's look fine, if needed could add on some metoprolol.   No further inpatient cardiac testing or interventions planned. If recurrent issues with heart rhythm may call us back. We will signoff inpatient care.  Carlyle Dolly MD

## 2019-10-06 NOTE — Progress Notes (Signed)
Patient heart rate sustaining 130's to 160's. Notified attending orders received and implemented EKG showed Afib with rvr at 135. Attending aware. Continue to monitor patient.

## 2019-10-07 ENCOUNTER — Inpatient Hospital Stay (HOSPITAL_COMMUNITY): Payer: Medicare Other

## 2019-10-07 DIAGNOSIS — R180 Malignant ascites: Secondary | ICD-10-CM

## 2019-10-07 DIAGNOSIS — Z7189 Other specified counseling: Secondary | ICD-10-CM

## 2019-10-07 DIAGNOSIS — K5791 Diverticulosis of intestine, part unspecified, without perforation or abscess with bleeding: Secondary | ICD-10-CM

## 2019-10-07 LAB — CBC
HCT: 30.6 % — ABNORMAL LOW (ref 36.0–46.0)
Hemoglobin: 8.9 g/dL — ABNORMAL LOW (ref 12.0–15.0)
MCH: 23.4 pg — ABNORMAL LOW (ref 26.0–34.0)
MCHC: 29.1 g/dL — ABNORMAL LOW (ref 30.0–36.0)
MCV: 80.3 fL (ref 80.0–100.0)
Platelets: 360 10*3/uL (ref 150–400)
RBC: 3.81 MIL/uL — ABNORMAL LOW (ref 3.87–5.11)
RDW: 19.2 % — ABNORMAL HIGH (ref 11.5–15.5)
WBC: 14.1 10*3/uL — ABNORMAL HIGH (ref 4.0–10.5)
nRBC: 0 % (ref 0.0–0.2)

## 2019-10-07 LAB — TSH: TSH: 0.861 u[IU]/mL (ref 0.350–4.500)

## 2019-10-07 LAB — MAGNESIUM: Magnesium: 1.7 mg/dL (ref 1.7–2.4)

## 2019-10-07 MED ORDER — MORPHINE SULFATE (CONCENTRATE) 10 MG/0.5ML PO SOLN
5.0000 mg | ORAL | Status: DC
  Administered 2019-10-07 – 2019-10-08 (×4): 5 mg via ORAL
  Filled 2019-10-07 (×4): qty 0.5

## 2019-10-07 MED ORDER — ONDANSETRON HCL 4 MG/2ML IJ SOLN
4.0000 mg | Freq: Four times a day (QID) | INTRAMUSCULAR | Status: DC
  Administered 2019-10-07 – 2019-10-08 (×4): 4 mg via INTRAVENOUS
  Filled 2019-10-07 (×3): qty 2

## 2019-10-07 MED ORDER — ALPRAZOLAM 0.25 MG PO TABS: 0.2500 mg | ORAL_TABLET | Freq: Four times a day (QID) | ORAL | Status: DC | PRN

## 2019-10-07 MED ORDER — ONDANSETRON HCL 4 MG/2ML IJ SOLN
4.0000 mg | Freq: Once | INTRAMUSCULAR | Status: AC
  Administered 2019-10-07: 4 mg via INTRAVENOUS
  Filled 2019-10-07: qty 2

## 2019-10-07 MED ORDER — MORPHINE SULFATE (CONCENTRATE) 10 MG/0.5ML PO SOLN: 5.0000 mg | ORAL | Status: DC | PRN

## 2019-10-07 NOTE — Progress Notes (Signed)
The patient is not seen.  I have reviewed her case with GI service, Dr. Oneida Alar The patient has numerous comorbidities and recurrent admissions to the hospital for multiple different reasons She is on maximum supportive care at home and her daughter is getting burnout trying to take care of her  In the past, I have numerous goals of care discussion with the patient and she wants "everything done" including transfusion support  I have recommended her to stop chemotherapy several months ago and even that, it took a lot of time in counseling to make the patient understand the rationale behind stopping treatment  I recommend consulting palliative care for consideration to transition her care to comfort measures only.  That would include stopping all nonintentional medications including Xarelto and focus on comfort only.  If the patient is willing to accept, in my opinion, without aggressive supportive measures such as blood draw, transfusion, cardiac medications, and others, I anticipate her prognosis is poor, likely measuring less than 14 days.  She would qualify for residential hospice facility.

## 2019-10-07 NOTE — Progress Notes (Signed)
Palliative: [name] is resting quietly in bed.  She appears to have declined overnight, and looks very frail and pale.  She is able to tell me her name, and that we are in the hospital.  Her only child, [name] is at bedside.  We talked about her worsening condition, increasing pain.  We also talked about the chronic illness pathway, what is normal and expected.  We talked about recommendations for hospice care, comfort and dignity at end-of-life.  [name] and [name] states they would like comfort measures only, transition to residential hospice.  Choice offered, they would like [facility].  Prognosis discussed in private with daughter, 2 weeks or less expected.  Orders adjusted for comfort, DNR completed.  Conference with attending, RN CM and bedside nursing related to patient condition, needs, comfort measures.   Plan: Patient and daughter requesting comfort and dignity at end-of-life, residential hospice with [facility]. Prognosis: 2 weeks or less expected  55 minutes, extended time Laura Axe, NP Palliative Medicine Team Team Phone # [phone] Greater than 50% of this time was spent counseling and coordinating care related to the above assessment and plan.

## 2019-10-07 NOTE — Progress Notes (Signed)
    Subjective: Dry heaves this morning. Received Zofran around 0620. Still with nausea and dry heaves. Can't get comfortable in the bed. No abdominal pain. No rectal bleeding.   Objective: Vital signs in last 24 hours: Temp:  [98 F (36.7 C)] 98 F (36.7 C) (12/30 0615) Pulse Rate:  [100-148] 100 (12/30 0622) Resp:  [18-20] 20 (12/30 0622) BP: (114-137)/(77-121) 125/78 (12/30 0622) SpO2:  [96 %-100 %] 99 % (12/30 0622) Last BM Date: 10/06/19(Per pt) General:   Alert and oriented, uncomfortable Head:  Normocephalic and atraumatic. Abdomen:  Bowel sounds present, ventral hernia protruding but reduces, abdomen distended but soft Neurologic:  Alert and  oriented x4  Intake/Output from previous day: 12/29 0701 - 12/30 0700 In: 1597.1 [I.V.:1597.1] Out: 100 [Urine:100] Intake/Output this shift: No intake/output data recorded.  Lab Results: Recent Labs    10/04/19 2111 10/05/19 0452 10/05/19 1650 10/06/19 0432 10/06/19 1659  WBC 8.4 8.4  --  8.9  --   HGB 7.9* 8.0* 8.1* 8.3* 8.8*  HCT 26.8* 28.8* 28.4* 29.2* 30.6*  PLT 305 323  --  334  --    BMET Recent Labs    10/04/19 1307 10/05/19 0452 10/06/19 0432  NA 133* 134* 135  K 3.8 3.7 3.8  CL 96* 97* 101  CO2 29 28 27   GLUCOSE 99 64* 88  BUN 18 15 14   CREATININE 1.12* 0.91 0.95  CALCIUM 7.6* 7.7* 7.6*   LFT Recent Labs    10/04/19 1307 10/05/19 0452  PROT 5.9* 5.3*  ALBUMIN 2.3* 2.1*  AST 25 24  ALT 16 15  ALKPHOS 51 44  BILITOT 0.5 0.8   PT/INR Recent Labs    10/04/19 1307  LABPROT 29.6*  INR 2.8*    Assessment/Plan: 85 year old female with multiple comorbidities, presenting with painless large volume hematochezia while on Xarelto and felt to be most likely diverticular bleed. Hgb remaining stable.   History of ovarian cancer: chemo on hold due to poor performance status. [place] para today. Appreciate Oncology's input (Dr. Alvy Bimler) and recommendations for palliative care and consideration to move  towards comfort measures only due to poor prognosis.   Currently, patient is noting severe nausea and dry heaves. I've ordered an additional 4 mg Zofran IV now and will have this scheduled going forward instead of prn.   Will follow peripherally at this time. Appreciate Palliative Care involvement.   Laura Needs, PhD, ANP-BC [facility]      LOS: 2 days    10/07/2019, 7:35 AM

## 2019-10-07 NOTE — Progress Notes (Signed)
PROGRESS NOTE [facility]   [name]  SUP:[id number]  DOB: [birth year]  DOA: 10/04/2019 PCP: Asencion Noble, MD   Brief Admission Hx: 85 y.o. female with medical history significant of COPD on home oxygen at 2 L, ovarian cancer who is currently undergoing chemotherapy, chronic kidney disease stage III, atrial fibrillation on anticoagulation, presents to the hospital with complaints of rectal bleeding.  She reports that for the past week, she has had progressive weakness.  She started to have multiple bowel movements containing large amounts of dark red blood over the past 24 hours.   MDM/Assessment & Plan:   1. Acute diverticular bleeding-patient has had this in the past.  She had been fully anticoagulated with Xarelto which is currently on hold.  She is tolerating diet well.  She has been seen by the GI service.  We are going to monitor her closely and manage conservatively unless she has a significant bleed requiring more aggressive approach.  Transfuse as needed.  Follow.  2. Acute blood loss anemia on chronic anemia-hemoglobin holding stable.  She had a bowel movement this morning but there was no blood seen.  Continue to hold Xarelto for now.  Type and cross and transfuse as needed.  CBC in AM.  3. Chronic atrial fibrillation-patient had RVR response when getting up to the bathroom this morning, did not have diltiazem dose from last night, we resumed her home oral diltiazem and provided IV Lopressor as needed and heart rate is improved.  Continue to follow.  As noted above Xarelto on hold. 4. COPD with chronic respiratory failure-bronchodilators ordered as needed.  Continue supplemental oxygen 2 L/min which is her baseline. 5. Stage IIIa CKD-her creatinine is slightly improved with hydration continue to follow. 6. Left ovarian epithelial cancer-she is being actively treated by the oncology service with chemotherapy.  Outpatient follow-up after discharge. Appreciate advice from Dr.  [name] 7. Hypothyroidism-resume home levothyroxine, would not check TSH at this time.  Follow-up outpatient. 8. Generalized weakness and physical debility-PT evaluation recommended.  DVT prophylaxis: SCDs Code Status: Full Family Communication: Patient updated at bedside, verbalized understanding Disposition Plan: Inpatient  Consultants:  GI service  Procedures:  Anoscopy done in the ED 10/04/2019  Antimicrobials:  N/A  Subjective: Patient reports hurting over entire body and not able to get comfortable.  Tired of hurting.     Objective: Vitals:   10/07/19 0622 10/07/19 0742 10/07/19 0752 10/07/19 1358  BP: 125/78     Pulse: 100     Resp: 20     Temp:      TempSrc:      SpO2: 99% 98% 100% 98%  Weight:      Height:        Intake/Output Summary (Last 24 hours) at 10/07/2019 1532 Last data filed at 10/07/2019 0500 Gross per 24 hour  Intake 1597.07 ml  Output 100 ml  Net 1497.07 ml   Filed Weights   10/04/19 1147  Weight: 72.6 kg   REVIEW OF SYSTEMS  As per history otherwise all reviewed and reported negative  Exam:  General exam: Chronically ill-appearing female she is lying in the bed she is awake and alert and oriented x3 in no apparent distress. Respiratory system: Clear. No increased work of breathing. Cardiovascular system: Irregularly irregular tachycardic normal S1 & S2 heard. No JVD, murmurs, gallops, clicks or pedal edema. Gastrointestinal system: Abdomen is nondistended, soft and nontender. Normal bowel sounds heard. Central nervous system: Alert and oriented. No focal neurological  deficits. Extremities: no CCE.  Data Reviewed: Basic Metabolic Panel: Recent Labs  Lab 10/04/19 1307 10/05/19 0452 10/06/19 0432 10/07/19 0559  NA 133* 134* 135  --   K 3.8 3.7 3.8  --   CL 96* 97* 101  --   CO2 29 28 27   --   GLUCOSE 99 64* 88  --   BUN 18 15 14   --   CREATININE 1.12* 0.91 0.95  --   CALCIUM 7.6* 7.7* 7.6*  --   MG  --   --   --  1.7    Liver Function Tests: Recent Labs  Lab 10/04/19 1307 10/05/19 0452  AST 25 24  ALT 16 15  ALKPHOS 51 44  BILITOT 0.5 0.8  PROT 5.9* 5.3*  ALBUMIN 2.3* 2.1*   No results for input(s): LIPASE, AMYLASE in the last 168 hours. No results for input(s): AMMONIA in the last 168 hours. CBC: Recent Labs  Lab 10/04/19 1307 10/04/19 2111 10/05/19 0452 10/05/19 1108 10/05/19 1650 10/06/19 0432 10/06/19 1659 10/07/19 0559  WBC 10.0 8.4 8.4  --   --  8.9  --  14.1*  NEUTROABS 7.4  --   --   --   --   --   --   --   HGB 8.7* 7.9* 8.0* 8.5* 8.1* 8.3* 8.8* 8.9*  HCT 29.8* 26.8* 28.8* 29.2* 28.4* 29.2* 30.6* 30.6*  MCV 80.1 80.0 80.4  --   --  79.8*  --  80.3  PLT 366 305 323  --   --  334  --  360   Cardiac Enzymes: No results for input(s): CKTOTAL, CKMB, CKMBINDEX, TROPONINI in the last 168 hours. CBG (last 3)  No results for input(s): GLUCAP in the last 72 hours. Recent Results (from the past 240 hour(s))  SARS CORONAVIRUS 2 (TAT 6-24 HRS) Nasopharyngeal Nasopharyngeal Swab     Status: None   Collection Time: 10/04/19  7:46 PM   Specimen: Nasopharyngeal Swab  Result Value Ref Range Status   SARS Coronavirus 2 NEGATIVE NEGATIVE Final    Comment: (NOTE) SARS-CoV-2 target nucleic acids are NOT DETECTED. The SARS-CoV-2 RNA is generally detectable in upper and lower respiratory specimens during the acute phase of infection. Negative results do not preclude SARS-CoV-2 infection, do not rule out co-infections with other pathogens, and should not be used as the sole basis for treatment or other patient management decisions. Negative results must be combined with clinical observations, patient history, and epidemiological information. The expected result is Negative. Fact Sheet for Patients: SugarRoll.be Fact Sheet for Healthcare Providers: https://www.woods-mathews.com/ This test is not yet approved or cleared by the [place] FDA and   has been authorized for detection and/or diagnosis of SARS-CoV-2 by FDA under an Emergency Use Authorization (EUA). This EUA will remain  in effect (meaning this test can be used) for the duration of the COVID-19 declaration under Section 56 4(b)(1) of the Act, 21 U.S.C. section 360bbb-3(b)(1), unless the authorization is terminated or revoked sooner. Performed at [facility], [place] [address], [place], [place] [address]      Studies: US Abdomen Limited  Result Date: 10/07/2019 CLINICAL DATA:  Assess for ascites and possible paracentesis EXAM: LIMITED ABDOMEN ULTRASOUND FOR ASCITES TECHNIQUE: Limited ultrasound survey for ascites was performed in all four abdominal quadrants. COMPARISON:  None. FINDINGS: Only a small amount of ascites noted, not sufficient for therapeutic paracentesis at this time. IMPRESSION: Only a small amount of ascites present. Electronically Signed  By: Rolm Baptise M.D.   On: 10/07/2019 11:30   Scheduled Meds: . budesonide (PULMICORT) nebulizer solution  0.25 mg Nebulization BID  . Chlorhexidine Gluconate Cloth  6 each Topical Daily  . dicyclomine  10 mg Oral BID  . diltiazem  180 mg Oral BID  . dronabinol  2.5 mg Oral BID AC  . DULoxetine  60 mg Oral QHS  . furosemide  40 mg Oral Daily  . levalbuterol  0.63 mg Nebulization Q6H  . levothyroxine  137 mcg Oral QAC breakfast  . ondansetron (ZOFRAN) IV  4 mg Intravenous Q6H  . prednisoLONE acetate  2 drop Both Eyes QID   Continuous Infusions: . sodium chloride 10 mL/hr at 10/07/19 8588   Active Problems:   Hypothyroidism   COPD (chronic obstructive pulmonary disease) (HCC)   Left ovarian epithelial cancer (HCC)   Anemia, chronic disease   Lower GI bleeding   CKD (chronic kidney disease) stage 3, GFR 30-59 ml/min   Chronic atrial fibrillation (HCC)   Chronic respiratory failure with hypoxia (HCC)   GI bleeding   Iron deficiency anemia due to chronic blood loss   Palliative care by specialist    DNR (do not resuscitate) discussion  Time spent:   Irwin Brakeman, MD [facility] 10/07/2019, 3:32 PM    LOS: 2 days  How to contact the [facility] Attending or Consulting provider [place] or covering provider during after hours [place], for this patient?  1. Check the care team in [facility] and look for a) attending/consulting TR[facility] provider listed and b) the [facility] team listed 2. Log into [email or web address] and use [place]'s universal password to access. If you do not have the password, please contact the hospital operator. 3. Locate the [facility] provider you are looking for under [facility] and page to a number that you can be directly reached. 4. If you still have difficulty reaching the provider, please page the [facility] (Director on Call) for the Hospitalists listed on amion for assistance.

## 2019-10-07 NOTE — TOC Progression Note (Signed)
Transition of Care [facility]) - Progression Note    Patient Details  Name: [name] MRN: [id number] Date of Birth: Mar 26, 1933  [facility] CM/SW Contact  Damariz Paganelli, Chauncey [place], RN Phone Number: 10/07/2019, 2:58 PM  Clinical Narrative:   Called referral to [facility].    Expected Discharge Plan: [place] Barriers to Discharge: Continued Medical Work up  Expected Discharge Plan and Services Expected Discharge Plan: [place]   Discharge Planning Services: CM Consult Post Acute Care Choice: [place] Living arrangements for the past 2 months: Single Family Home                                       Social Determinants of Health (SDOH) Interventions    Readmission Risk Interventions No flowsheet data found.

## 2019-10-08 NOTE — TOC Transition Note (Signed)
Transition of Care [facility]) - CM/SW Discharge Note   Patient Details  Name: [name] MRN: [id number] Date of Birth: [birth year]  [facility]) CM/SW Contact:  Janai Maudlin, Chauncey Reading, RN Phone Number: 10/08/2019, 10:26 AM   Clinical Narrative:  Patient discharging to residential hospice home ([facility] ). Daughter updated. EMS arranged. Bedside RN to call report to [phone].     Final next level of care: [place] Barriers to Discharge: Barriers Resolved   Patient Goals and CMS Choice Patient states their goals for this hospitalization and ongoing recovery are:: go to short term rehab and return home CMS Medicare.gov Compare Post Acute Care list provided to:: Patient(and dtr, [name]) Choice offered to / list presented to : Patient, Adult Children  Discharge Placement              Patient chooses bed at: (hospice home) Patient to be transferred to facility by: [place] Name of family member notified: [name] Patient and family notified of of transfer: 10/08/19  Discharge Plan and Services   Discharge Planning Services: CM Consult Post Acute Care Choice: [place]                               Social Determinants of Health (SDOH) Interventions     Readmission Risk Interventions No flowsheet data found.

## 2019-10-08 NOTE — Progress Notes (Signed)
Received call form daughter checking on pt status. Updated daughter on pt status. All questions answered. No further questions at this time. Will continue to monitor.

## 2019-10-08 NOTE — Discharge Instructions (Signed)
SYMPTOM MANAGEMENT PER [facility]OSPICE [facility]

## 2019-10-08 NOTE — Discharge Summary (Signed)
Physician Discharge Summary  [name] [id number]:[id number] DOB: [birth year] DOA: 10/04/2019  PCP: Asencion Noble, MD Oncologist: Dr. Natale Lay  Admit date: 10/04/2019 Discharge date: 10/08/2019  Disposition:  [facility]idential H[facility]ice  Recommendations  SYMPTOM MANAGEMENT PER HOSPICE PROTOCOLS  Discharge Condition: HOSPICE   CODE STATUS: DNR    Brief Hospitalization Summary: Please see all hospital notes, images, labs for full details of the hospitalization. ADMISSION HPI: HPI: [name] is a 85 y.o. female with medical history significant of COPD on home oxygen at 2 L, ovarian cancer who is currently undergoing chemotherapy, chronic kidney disease stage III, atrial fibrillation on anticoagulation, presents to the hospital with complaints of rectal bleeding.  She reports that for the past week, she has had progressive weakness.  She started to have multiple bowel movements containing large amounts of dark red blood over the past 24 hours.  She is also had mild abdominal cramping.  She has chronic shortness of breath and does not feel this is any worse than her baseline.  She is generally weak.  She is not had any vomiting.  She is not had any fever.  She has had a chronic cough which is unchanged.  ED Course: Vitals were noted to be stable.  She had stools test positive for Hemoccult.  Anoscopy performed by ER physician did not show any signs of active bleeding.  Remainder of labs were unrevealing.  Last hemoglobin from 10 days ago was 9.3.  On ER visit today, hemoglobin 8.7.  She is hemodynamically stable.  Brief Admission Hx: 85 y.o.femalewith medical history significant ofCOPD on home oxygen at 2 L, ovarian cancer who is currently undergoing chemotherapy, chronic kidney disease stage III, atrial fibrillation on anticoagulation, presents to the hospital with complaints of rectal bleeding. She reports that for the past week, she has had progressive weakness. She started to have multiple  bowel movements containing large amounts of dark red blood over the past 24 hours prior to arrival.    MDM/Assessment & Plan:   1. Acute diverticular bleeding-patient has had this in the past.  She had been fully anticoagulated with Xarelto which is currently on hold.  Pt has been transitioned to full comfort care measures.  2.  Acute blood loss anemia on chronic anemia-transitioned to full comfort care measures.  3. Chronic atrial fibrillation-full comfort care.  4. Stage IIIa CKD. 5. Left ovarian epithelial cancer-she had been treated by the oncology service with chemotherapy but after speaking with Dr. Alvy Bimler she has recommended full comfort care as her disease is not curable and chemotherapy had been discontinued. Pt wants to focus on pain management and quality of life and after discussions with palliative medicine is now full comfort care.  6. Hypothyroidism-full comfort care measures Generalized weakness and physical debility - full comfort care measures    Consultants:  GI service  Palliative medicine  Procedures:  Anoscopy done in the ED 10/04/2019  Antimicrobials:  N/A  Discharge Diagnoses:  Active Problems:   Hypothyroidism   COPD (chronic obstructive pulmonary disease) (HCC)   Left ovarian epithelial cancer (HCC)   Anemia, chronic disease   Lower GI bleeding   CKD (chronic kidney disease) stage 3, GFR 30-59 ml/min   Chronic atrial fibrillation (HCC)   Chronic respiratory failure with hypoxia (HCC)   GI bleeding   Iron deficiency anemia due to chronic blood loss   Palliative care by specialist   DNR (do not resuscitate) discussion   Encounter for hospice care discussion  Discharge Instructions:  Allergies as of 10/08/2019      Reactions   Codeine Itching      Medication List    STOP taking these medications   albuterol 108 (90 Base) MCG/ACT inhaler Commonly known as: ProAir HFA   ALPRAZolam 0.25 MG tablet Commonly known as: XANAX   Arnuity  Ellipta 100 MCG/ACT Aepb Generic drug: Fluticasone Furoate   azelastine 0.1 % nasal spray Commonly known as: ASTELIN   dicyclomine 10 MG capsule Commonly known as: BENTYL   diltiazem 180 MG 24 hr capsule Commonly known as: CARDIZEM CD   diphenhydrAMINE 25 mg capsule Commonly known as: BENADRYL   dronabinol 2.5 MG capsule Commonly known as: MARINOL   DULoxetine 60 MG capsule Commonly known as: CYMBALTA   fluticasone 50 MCG/ACT nasal spray Commonly known as: FLONASE   furosemide 40 MG tablet Commonly known as: LASIX   halobetasol 0.05 % cream Commonly known as: ULTRAVATE   hydrocortisone valerate cream 0.2 % Commonly known as: WESTCORT   levothyroxine 137 MCG tablet Commonly known as: SYNTHROID   lidocaine 5 % Commonly known as: Lidoderm   Magnesium 400 MG Caps   Oxycodone HCl 10 MG Tabs   polyvinyl alcohol 1.4 % ophthalmic solution Commonly known as: LIQUIFILM TEARS   potassium chloride SA 20 MEQ tablet Commonly known as: KLOR-CON   prednisoLONE acetate 1 % ophthalmic suspension Commonly known as: PRED FORTE   Rivaroxaban 15 MG Tabs tablet Commonly known as: XARELTO   Stiolto Respimat 2.5-2.5 MCG/ACT Aers Generic drug: Tiotropium Bromide-Olodaterol   Trelegy Ellipta 100-62.5-25 MCG/INH Aepb Generic drug: Fluticasone-Umeclidin-Vilant   Vitamin D 50 MCG (2000 UT) Caps      Follow-up Information    Satira Sark, MD Follow up on 10/21/2019.   Specialty: Cardiology Why: Keep scheduled Cardiology follow-up for 10/21/2019 at 11:40 AM.  Contact information: [place] [address] [phone]          Allergies  Allergen Reactions  . Codeine Itching   Allergies as of 10/08/2019      Reactions   Codeine Itching      Medication List    STOP taking these medications   albuterol 108 (90 Base) MCG/ACT inhaler Commonly known as: ProAir HFA   ALPRAZolam 0.25 MG tablet Commonly known as: XANAX   Arnuity Ellipta 100  MCG/ACT Aepb Generic drug: Fluticasone Furoate   azelastine 0.1 % nasal spray Commonly known as: ASTELIN   dicyclomine 10 MG capsule Commonly known as: BENTYL   diltiazem 180 MG 24 hr capsule Commonly known as: CARDIZEM CD   diphenhydrAMINE 25 mg capsule Commonly known as: BENADRYL   dronabinol 2.5 MG capsule Commonly known as: MARINOL   DULoxetine 60 MG capsule Commonly known as: CYMBALTA   fluticasone 50 MCG/ACT nasal spray Commonly known as: FLONASE   furosemide 40 MG tablet Commonly known as: LASIX   halobetasol 0.05 % cream Commonly known as: ULTRAVATE   hydrocortisone valerate cream 0.2 % Commonly known as: WESTCORT   levothyroxine 137 MCG tablet Commonly known as: SYNTHROID   lidocaine 5 % Commonly known as: Lidoderm   Magnesium 400 MG Caps   Oxycodone HCl 10 MG Tabs   polyvinyl alcohol 1.4 % ophthalmic solution Commonly known as: LIQUIFILM TEARS   potassium chloride SA 20 MEQ tablet Commonly known as: KLOR-CON   prednisoLONE acetate 1 % ophthalmic suspension Commonly known as: PRED FORTE   Rivaroxaban 15 MG Tabs tablet Commonly known as: XARELTO   Stiolto Respimat 2.5-2.5  MCG/ACT Aers Generic drug: Tiotropium Bromide-Olodaterol   Trelegy Ellipta 100-62.5-25 MCG/INH Aepb Generic drug: Fluticasone-Umeclidin-Vilant   Vitamin D 50 MCG (2000 UT) Caps       Procedures/Studies: DG Chest 2 View  Result Date: 09/17/2019 CLINICAL DATA:  Shortness of breath EXAM: CHEST - 2 VIEW COMPARISON:  07/30/2019 FINDINGS: RIGHT jugular Port-A-Cath with tip projecting over cavoatrial junction. Normal heart size, mediastinal contours, and pulmonary vascularity. Atherosclerotic calcification aorta. Pleural effusion and subsegmental atelectasis at RIGHT base. Tiny pleural effusion and minimal LEFT basilar atelectasis. Underlying emphysematous changes. No acute infiltrate or pneumothorax. Bones demineralized. IMPRESSION: Emphysematous changes with bibasilar  effusions and atelectasis greater on RIGHT. Aortic Atherosclerosis (ICD10-I70.0). Electronically Signed   By: Lavonia Dana M.D.   On: 09/17/2019 16:24   US Abdomen Limited  Result Date: 10/07/2019 CLINICAL DATA:  Assess for ascites and possible paracentesis EXAM: LIMITED ABDOMEN ULTRASOUND FOR ASCITES TECHNIQUE: Limited ultrasound survey for ascites was performed in all four abdominal quadrants. COMPARISON:  None. FINDINGS: Only a small amount of ascites noted, not sufficient for therapeutic paracentesis at this time. IMPRESSION: Only a small amount of ascites present. Electronically Signed   By: Rolm Baptise M.D.   On: 10/07/2019 11:30      Subjective: Pt says her pain is much better controlled today.    Discharge Exam: Vitals:   10/08/19 0801 10/08/19 0806  BP:    Pulse:    Resp:    Temp:    SpO2: (!) 88% 100%   Vitals:   10/08/19 0236 10/08/19 0530 10/08/19 0801 10/08/19 0806  BP:  (!) 143/59    Pulse:  99    Resp:  18    Temp:  98.3 F (36.8 C)    TempSrc:  Oral    SpO2: 91% 98% (!) 88% 100%  Weight:      Height:        General: Pt is alert, awake, not in acute distress Cardiovascular: RRR, S1/S2 +, no rubs, no gallops Respiratory: CTA bilaterally, no wheezing, no rhonchi Abdominal: Soft, NT, ND, bowel sounds + Extremities:  no cyanosis   The results of significant diagnostics from this hospitalization (including imaging, microbiology, ancillary and laboratory) are listed below for reference.     Microbiology: Recent Results (from the past 240 hour(s))  SARS CORONAVIRUS 2 (TAT 6-24 HRS) Nasopharyngeal Nasopharyngeal Swab     Status: None   Collection Time: 10/04/19  7:46 PM   Specimen: Nasopharyngeal Swab  Result Value Ref Range Status   SARS Coronavirus 2 NEGATIVE NEGATIVE Final    Comment: (NOTE) SARS-CoV-2 target nucleic acids are NOT DETECTED. The SARS-CoV-2 RNA is generally detectable in upper and lower respiratory specimens during the acute phase of  infection. Negative results do not preclude SARS-CoV-2 infection, do not rule out co-infections with other pathogens, and should not be used as the sole basis for treatment or other patient management decisions. Negative results must be combined with clinical observations, patient history, and epidemiological information. The expected result is Negative. Fact Sheet for Patients: SugarRoll.be Fact Sheet for Healthcare Providers: https://www.woods-mathews.com/ This test is not yet approved or cleared by the [place] FDA and  has been authorized for detection and/or diagnosis of SARS-CoV-2 by FDA under an Emergency Use Authorization (EUA). This EUA will remain  in effect (meaning this test can be used) for the duration of the COVID-19 declaration under Section 56 4(b)(1) of the Act, 21 U.S.C. section 360bbb-3(b)(1), unless the authorization is terminated or revoked sooner. Performed at  [facility], [place] [address], [place], [place] [address]      Labs: BNP (last 3 results) Recent Labs    04/17/19 0918  BNP 397.6*   Basic Metabolic Panel: Recent Labs  Lab 10/04/19 1307 10/05/19 0452 10/06/19 0432 10/07/19 0559  NA 133* 134* 135  --   K 3.8 3.7 3.8  --   CL 96* 97* 101  --   CO2 29 28 27   --   GLUCOSE 99 64* 88  --   BUN 18 15 14   --   CREATININE 1.12* 0.91 0.95  --   CALCIUM 7.6* 7.7* 7.6*  --   MG  --   --   --  1.7   Liver Function Tests: Recent Labs  Lab 10/04/19 1307 10/05/19 0452  AST 25 24  ALT 16 15  ALKPHOS 51 44  BILITOT 0.5 0.8  PROT 5.9* 5.3*  ALBUMIN 2.3* 2.1*   No results for input(s): LIPASE, AMYLASE in the last 168 hours. No results for input(s): AMMONIA in the last 168 hours. CBC: Recent Labs  Lab 10/04/19 1307 10/04/19 2111 10/05/19 0452 10/05/19 1108 10/05/19 1650 10/06/19 0432 10/06/19 1659 10/07/19 0559  WBC 10.0 8.4 8.4  --   --  8.9  --  14.1*  NEUTROABS 7.4  --   --   --   --    --   --   --   HGB 8.7* 7.9* 8.0* 8.5* 8.1* 8.3* 8.8* 8.9*  HCT 29.8* 26.8* 28.8* 29.2* 28.4* 29.2* 30.6* 30.6*  MCV 80.1 80.0 80.4  --   --  79.8*  --  80.3  PLT 366 305 323  --   --  334  --  360   Cardiac Enzymes: No results for input(s): CKTOTAL, CKMB, CKMBINDEX, TROPONINI in the last 168 hours. BNP: Invalid input(s): POCBNP CBG: No results for input(s): GLUCAP in the last 168 hours. D-Dimer No results for input(s): DDIMER in the last 72 hours. Hgb A1c No results for input(s): HGBA1C in the last 72 hours. Lipid Profile No results for input(s): CHOL, HDL, LDLCALC, TRIG, CHOLHDL, LDLDIRECT in the last 72 hours. Thyroid function studies Recent Labs    10/07/19 0559  TSH 0.861   Anemia work up No results for input(s): VITAMINB12, FOLATE, FERRITIN, TIBC, IRON, RETICCTPCT in the last 72 hours. Urinalysis    Component Value Date/Time   COLORURINE YELLOW 04/26/2019 1729   APPEARANCEUR HAZY (A) 04/26/2019 1729   LABSPEC 1.009 04/26/2019 1729   LABSPEC 1.030 10/11/2017 1125   PHURINE 6.0 04/26/2019 1729   GLUCOSEU NEGATIVE 04/26/2019 1729   GLUCOSEU Negative 10/11/2017 1125   HGBUR MODERATE (A) 04/26/2019 1729   BILIRUBINUR NEGATIVE 04/26/2019 1729   BILIRUBINUR Negative 10/11/2017 1125   KETONESUR NEGATIVE 04/26/2019 1729   PROTEINUR NEGATIVE 04/26/2019 1729   UROBILINOGEN 0.2 10/11/2017 1125   NITRITE NEGATIVE 04/26/2019 1729   LEUKOCYTESUR MODERATE (A) 04/26/2019 1729   LEUKOCYTESUR Negative 10/11/2017 1125   Sepsis Labs Invalid input(s): PROCALCITONIN,  WBC,  LACTICIDVEN Microbiology Recent Results (from the past 240 hour(s))  SARS CORONAVIRUS 2 (TAT 6-24 HRS) Nasopharyngeal Nasopharyngeal Swab     Status: None   Collection Time: 10/04/19  7:46 PM   Specimen: Nasopharyngeal Swab  Result Value Ref Range Status   SARS Coronavirus 2 NEGATIVE NEGATIVE Final    Comment: (NOTE) SARS-CoV-2 target nucleic acids are NOT DETECTED. The SARS-CoV-2 RNA is generally  detectable in upper and lower respiratory specimens during the acute phase  of infection. Negative results do not preclude SARS-CoV-2 infection, do not rule out co-infections with other pathogens, and should not be used as the sole basis for treatment or other patient management decisions. Negative results must be combined with clinical observations, patient history, and epidemiological information. The expected result is Negative. Fact Sheet for Patients: SugarRoll.be Fact Sheet for Healthcare Providers: https://www.woods-mathews.com/ This test is not yet approved or cleared by the [place] FDA and  has been authorized for detection and/or diagnosis of SARS-CoV-2 by FDA under an Emergency Use Authorization (EUA). This EUA will remain  in effect (meaning this test can be used) for the duration of the COVID-19 declaration under Section 56 4(b)(1) of the Act, 21 U.S.C. section 360bbb-3(b)(1), unless the authorization is terminated or revoked sooner. Performed at [facility], [place] [address], [place], [place] [address]     Time coordinating discharge:   SIGNED:  Irwin Brakeman, MD  [facility] 10/08/2019, 9:58 AM How to contact the [facility] Attending or Consulting provider [place] or covering provider during after hours [place], for this patient?  1. Check the care team in [facility] and look for a) attending/consulting TR[facility] provider listed and b) the [facility] team listed 2. Log into [email or web address] and use [place]'s universal password to access. If you do not have the password, please contact the hospital operator. 3. Locate the [facility] provider you are looking for under [facility] and page to a number that you can be directly reached. 4. If you still have difficulty reaching the provider, please page the [facility] (Director on Call) for the Hospitalists listed on amion for assistance.

## 2019-10-08 NOTE — Care Management (Signed)
Patient Information  Patient Name  [name], [name] ([id number]) Legal Sex  Female DOB  September 16, 1933  Room Bed  A327 A327-01  Patient Demographics  Address  [place] [address] Phone  [phone] (Home) *Preferred*  [phone] (Mobile) E-mail Address  [email or web address]  Patient Ethnicity & Race  Ethnic Group Patient Race  Not Hispanic or Latino White or Caucasian  Emergency Contact(s)  Name Relation Home Work [place] Daughter [phone]  [phone]  [place] Sister [phone]  [phone]  Documents on File   Status Date Received Description  Documents for the Patient  EMR Medication Summary Not Received    EMR Immunization Summary Not Received    EMR Problem Summary Not Received    EMR Patient Summary Not Received    Driver's License Not Received    [place] Received 12/02/10   Advance Directives/Living Will/HCPOA/POA Not Received    Historic Radiology Documentation Not Received    [place] Not Received  [facility] ENTERED INFO: [id number]  Insurance Card Received 05/30/16 315  Insurance Card Not Received    Editor, commissioning Not Received    Insurance Card Received 06/19/11 MC/CONTINENTAL GENERAL INS  Insurance Card Received 10/21/12   Release of Information Not Received    Insurance Card Received 10/23/18 [place] Card Received 05/07/13   AMB Correspondence Not Received  12/13 Clinical Note Willey Blade, MD   Advanced Beneficiary Notice (ABN) Not Received    Driver's License Not Received    Insurance Card Received 12/07/13   Insurance Card Received 07/02/13 315  [place] HIPAA NOTICE OF PRIVACY - Scanned Received 10/23/18 HIPAA/WMC  [place] HIPAA NOTICE OF PRIVACY - Scanned Received 07/02/13 315  AMB HH/NH/Hospice Not Received  09/14 phys order [place]  AMB Provider Completed Forms Not Received  ORDER  [facility]  AMB Correspondence Not Received  06/14 clinical note Willey Blade MD,   AMB Correspondence Not Received  10/14 rehab [place] Card Received 12/16/13 Face Sheet-[place]  Insurance Card     HIM ROI Authorization  04/21/14   AMB Correspondence  07/31/14 PHYSICIAN ORDER/NEBULIZER  [facility]  Other Photo ID Not Received    AMB Correspondence  12/07/14 RECALL ASSESSMENT [facility]  Insurance Card Received 12/23/15 Medicare/Continental [employer] Co 2016  Advance Directives/Living Will/HCPOA/POA     Insurance Card Received 05/27/15 bcg  AMB Correspondence  41/28/78 WALK-IN POLICY [place] E-Signature HIPAA Notice of Privacy Signed 12/23/15 PATIENT RIGHTS [facility]VEHMC/9/47/09  Driver's License Received 62/83/66 NCDL EXP 7.21.2021  Release of Information Received 12/23/15 ROY FAGAN,MD.RELEASE OF INFORMATION/12/23/15  [place] HIPAA NOTICE OF PRIVACY - Scanned  12/29/15   AMB Correspondence  11/10/15 Dina Rich MD, R  Release of Information  12/29/15    HIPAA NOTICE OF PRIVACY - Scanned Received 05/30/16 315  Release of Information Received 06/18/16 DPR [facility] 06/18/16  Insurance Card Received 08/21/16 MC/CGIC  AMB Provider Completed Forms  02/05/17 MEDICAL CLEARANCE REQUEST [facility]  AMB Correspondence  02/26/17 5/17-8/17 [facility]  AMB Provider Completed Forms  04/01/17 LETTER [facility]  Release of Information Received 07/18/17 [facility] roi  AMB Provider Completed Forms Received 08/21/17 DISABILITY [facility]  Insurance Card Received 09/18/17 MEDICARE 2018  Insurance Card Received 02/05/18 Medicare/[employer]. Co.  AMB Intake Forms/Questionnaires Received 12/02/17 02/19 [facility] REFERRAL [facility]  Advance Directives/Living Will/HCPOA/POA Received 01/09/18 04/19:  TNCM [facility] POA  Insurance Card Received 03/12/18 MCR A/B-/[employer] (AP Rehab)  Coverage ID Card Received 03/17/18 MEDICARE (front)  Coverage  ID Card Received 03/17/18 MEDICARE (back)  Coverage ID Card Received 03/17/18 GENERIC COMMERCIAL (front)  Coverage ID Card Received 03/17/18 GENERIC COMMERCIAL (back)  AMB Correspondence Received 03/22/18 NONCLINICAL TELEPHONE RECORD [facility]  Insurance Card Received 05/14/18 Medicare/Continental Gen. Ins.  Insurance Card Received 06/17/18 wmc 2021 exp dl / new mcr  [place] HIPAA NOTICE OF PRIVACY - Scanned Received 06/17/18 wmc  AMB HH/NH/Hospice Received 07/30/18 ORDER [facility] [place] E-Signature HIPAA Notice of Privacy [place]     E-Signature AOB [place] Signed 09/11/18   AMB Provider Completed Forms Received 06/24/18 DETAILED WRITTEN ORDER [facility]  Insurance Card Received 08/18/18 TFAC-NEW M;CARE/SUPP/2019  Release of Information Received 08/18/18 TFAC-DPR-2019  Release of Information Received 09/11/18 DPR TCTS 2019  Coverage ID Card Received 10/27/18 GENERIC COMMERCIAL (front)  Coverage ID Card Received 10/27/18 GENERIC COMMERCIAL (back)  ACP Flag Received 01/09/18   AMB HH/NH/Hospice Received 37/16/96 [place] [facility]  HIM ROI Authorization Received (Expired) 03/03/19   HIM ROI Authorization Received 03/04/19 ROI-[facility]  HIM ROI Authorization Received 03/12/19 [facility]  Advance Directives Received 04/11/19   AMB Correspondence Received 08/25/19 PRIOR AUTH REQUEST [facility]  [place] E-Signature HIPAA Notice of Privacy Signed (Deleted) 12/02/10   [place] E-Signature HIPAA Notice of Privacy [place] Not Received (Deleted)    AMB Correspondence Not Received (Deleted)  07/14 clinical note Willey Blade MD,   Driver's License Received (Deleted) 06/12/13   Insurance Card Received (Deleted) 06/12/13 Medicare/Continental/GNA  Insurance Card Not Received (Deleted)    AMB Correspondence (Deleted) 78/93/81 WALK-IN POLICY [facility] HEART CARE  Insurance Card Received (Deleted) 07/22/17 new medicare 2018  Coverage ID Card  Received (Deleted) 03/17/18 MEDICARE (front)  HIM Release of Information Output (Deleted) 03/12/19 Requested records  HIM Release of Information Output (Deleted) 03/12/19 Requested records  HIM Release of Information Output (Deleted) 03/12/19 Requested records  Documents for the Encounter  AOB (Assignment of Insurance Benefits) Received 10/04/19 AOB Verbal Consent Daughter 10/04/19  E-signature AOB     MEDICARE RIGHTS Received 10/04/19 Medicare Rights Verbal Consent Daughter 10/04/19  E-signature Medicare Rights     ED Patient Billing Extract   ED PB Billing Extract  Cardiac Monitoring Strip Received 10/05/19   Cardiac Monitoring Strip Shift Summary Received 10/06/19   Medicare Observation     EKG Received 10/05/19   Admission Information  Current Information  Attending Provider Admitting Provider Admission Type Admission Status  Murlean Iba, MD Kathie Dike, MD Emergency Admission (Confirmed)       Admission Date/Time Discharge Date Hospital Service Auth/Cert Status  01/75/10 11:28 AM  [place] Unit Room/Bed   [facility] AP-DEPT 300 A327/A327-01        Admission  Complaint  GI [facility] Account  Name Acct ID Class Status Primary Coverage  [name], [name] [id number] Inpatient Open MEDICARE - MEDICARE PART A AND B      Guarantor Account (for [place] [id number])  Name Relation to Pt Service Area Active? Acct Type  [name] Self CHSA Yes Personal/Family  Address Phone    [address]  [place], [place] [address] [phone])        Coverage Information (for Hospital Account [id number])  1. MEDICARE/MEDICARE PART A AND B  F/O  Payor/Plan Precert #  MEDICARE/MEDICARE PART A AND B   Subscriber Subscriber #  [name], [name] [id number]  Address Phone  [address]  [place],  [address]   2. GENERIC COMMERCIAL/GENERIC COMMERCIAL  F/O Payor/Plan Precert #  [facility]  COMMERCIAL/GENERIC COMMERCIAL   Subscriber Subscriber #  [name], [name] [id number]  Address Phone  [place] [address] [phone]

## 2019-10-08 NOTE — TOC Transition Note (Signed)
Transition of Care [facility]) - CM/SW Discharge Note   Patient Details  Name: [name] MRN: [id number] Date of Birth: Sep 16, 1933  Transition of Care [facility]) CM/SW Contact:  Robin Petrakis, Chauncey Reading, RN Phone Number: 10/08/2019, 10:41 AM     Final next level of care: [place] Barriers to Discharge: Barriers Resolved              Patient chooses bed at: (hospice home) Patient to be transferred to facility by: [facility] Name of family member notified: [name] Patient and family notified of of transfer: 10/08/19  Discharge Plan and Services   Discharge Planning Services: CM Consult Post Acute Care Choice: [place]                Social Determinants of Health ([place]) Interventions     Readmission Risk Interventions Readmission Risk Prevention Plan 10/08/2019  Transportation Screening Complete  PCP or Specialist Appt within 3-5 Days Complete  HRI or [place] Complete  Social Work Consult for [place] Planning/Counseling Complete  Palliative Care Screening Complete  Medication Review [employer]) Complete  Some recent data might be hidden

## 2019-10-19 ENCOUNTER — Ambulatory Visit: Payer: Medicare Other | Admitting: Emergency Medicine

## 2019-10-20 ENCOUNTER — Telehealth: Payer: Self-pay | Admitting: Emergency Medicine

## 2019-10-20 NOTE — Telephone Encounter (Signed)
Thank you for letting me know

## 2019-10-20 NOTE — Telephone Encounter (Signed)
Wanted to send this to you so that you are aware that patient passed on 18-Oct-2019

## 2019-10-21 ENCOUNTER — Ambulatory Visit: Payer: Medicare Other | Admitting: Cardiology

## 2019-10-23 ENCOUNTER — Ambulatory Visit: Payer: Medicare Other

## 2019-10-23 ENCOUNTER — Other Ambulatory Visit: Payer: Medicare Other

## 2019-10-23 ENCOUNTER — Ambulatory Visit: Payer: Medicare Other | Admitting: Hematology and Oncology

## 2019-10-28 ENCOUNTER — Ambulatory Visit: Payer: Medicare Other | Admitting: Cardiology

## 2019-11-09 DEATH — deceased

## 2019-12-21 IMAGING — DX DG CHEST 2V
2 series · 2 of 2 positions shown · non-contrast
Comparison: July 20, 2019.

CLINICAL DATA: Pleural effusion, cough.

EXAM:
CHEST - 2 VIEW

[chest lat]
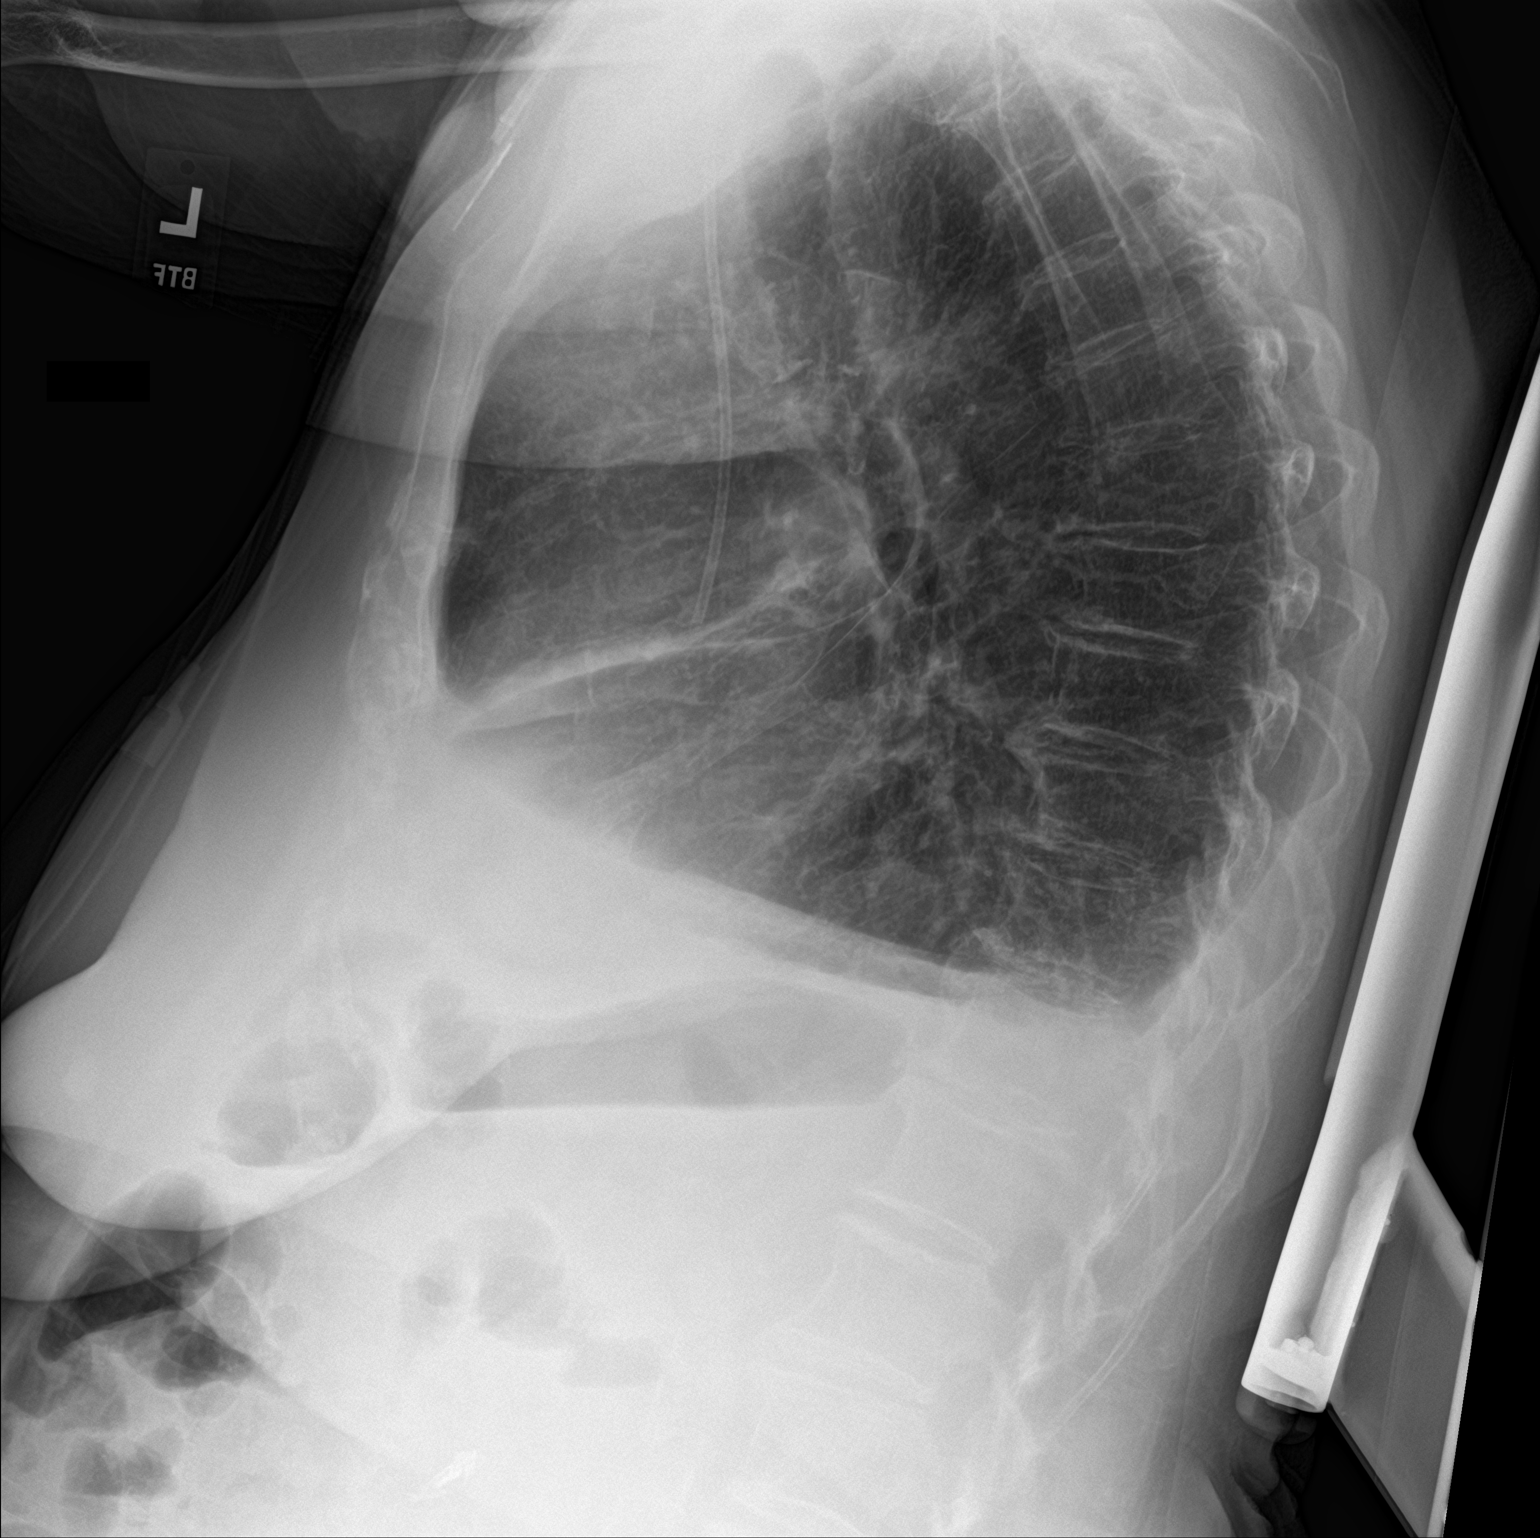

[chest ap]
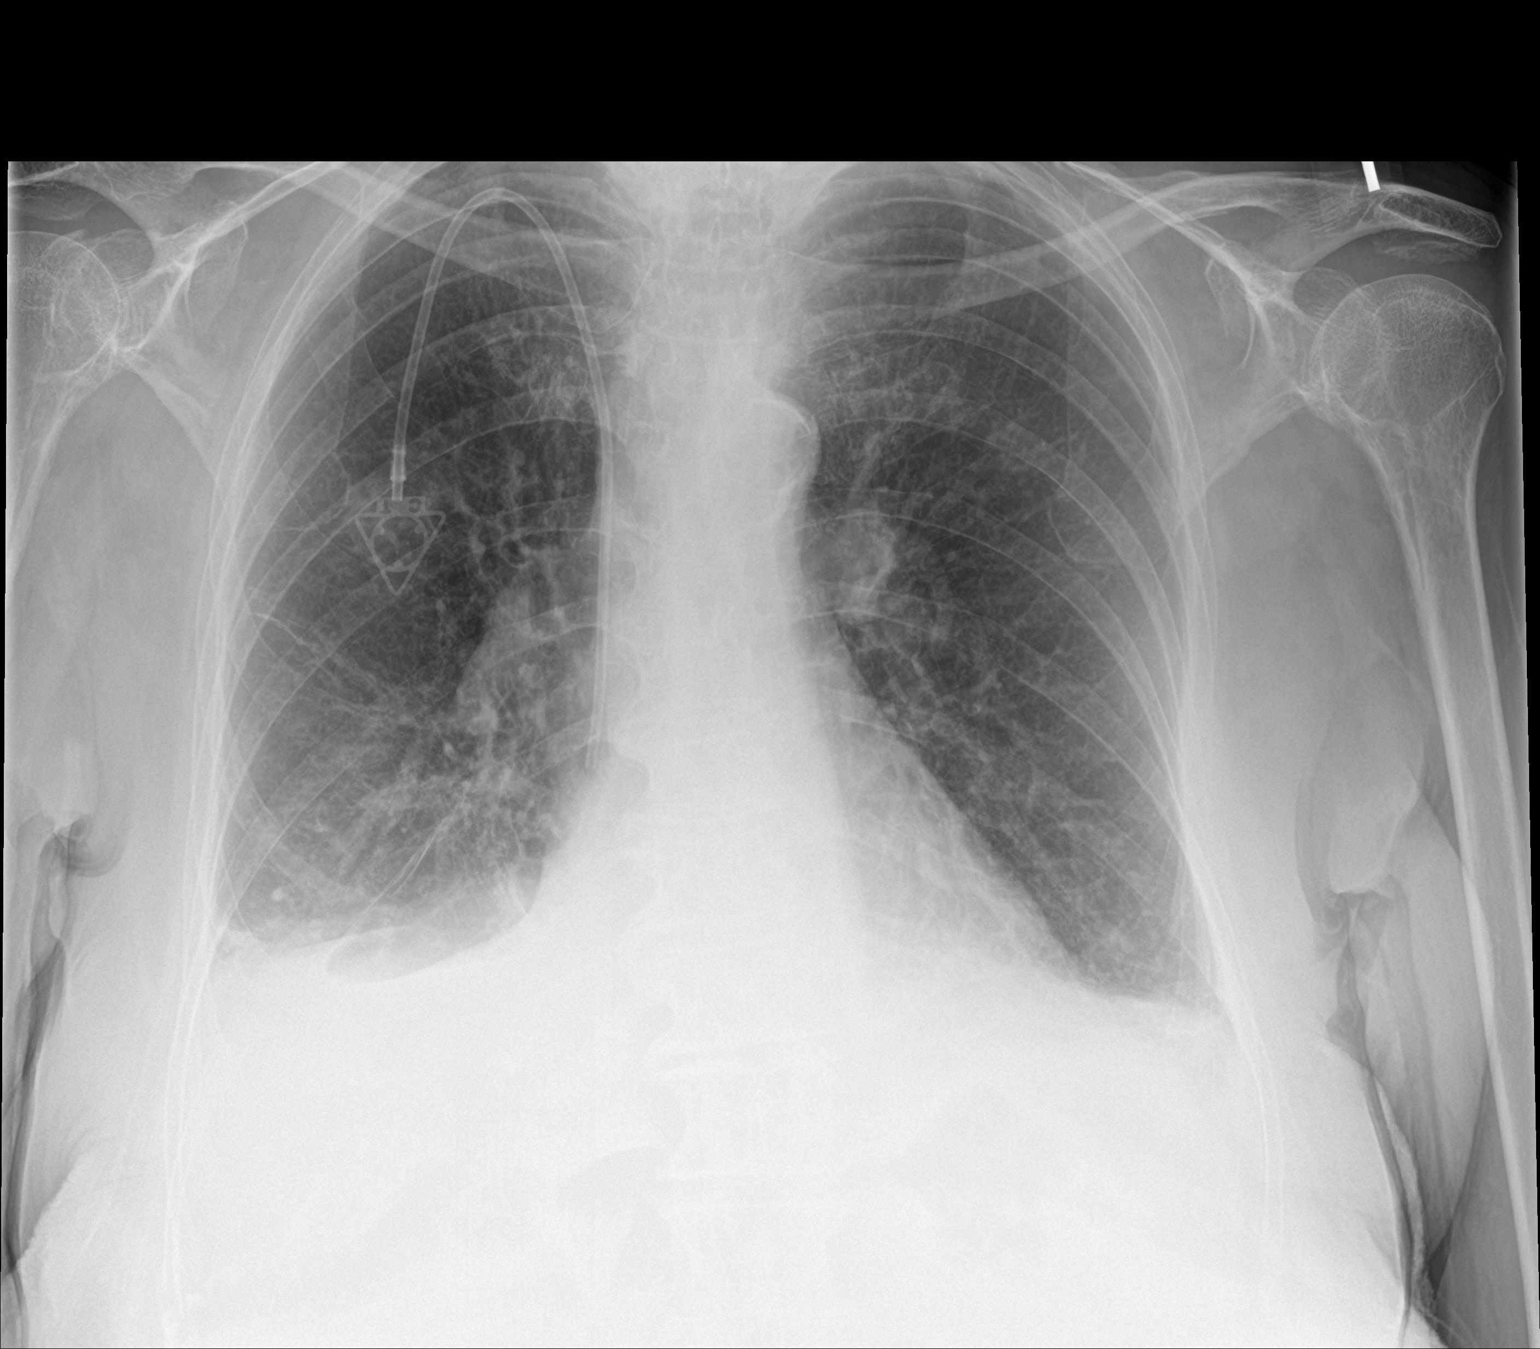

[2 of 2 positions shown; findings below may reference images not displayed]

FINDINGS: The heart size and mediastinal contours are within normal limits.
Right internal jugular Port-A-Cath is unchanged in position. No
pneumothorax is noted. Mild bibasilar subsegmental atelectasis is
noted with small pleural effusions. The visualized skeletal
structures are unremarkable.
IMPRESSION: Mild bibasilar subsegmental atelectasis is noted with small pleural
effusions.

Aortic Atherosclerosis ([id number]-[id number]).

## 2020-02-08 IMAGING — DX DG CHEST 2V
2 series · 2 of 2 positions shown · non-contrast
Comparison: 07/30/2019

CLINICAL DATA: Shortness of breath

EXAM:
CHEST - 2 VIEW

[chest pa]
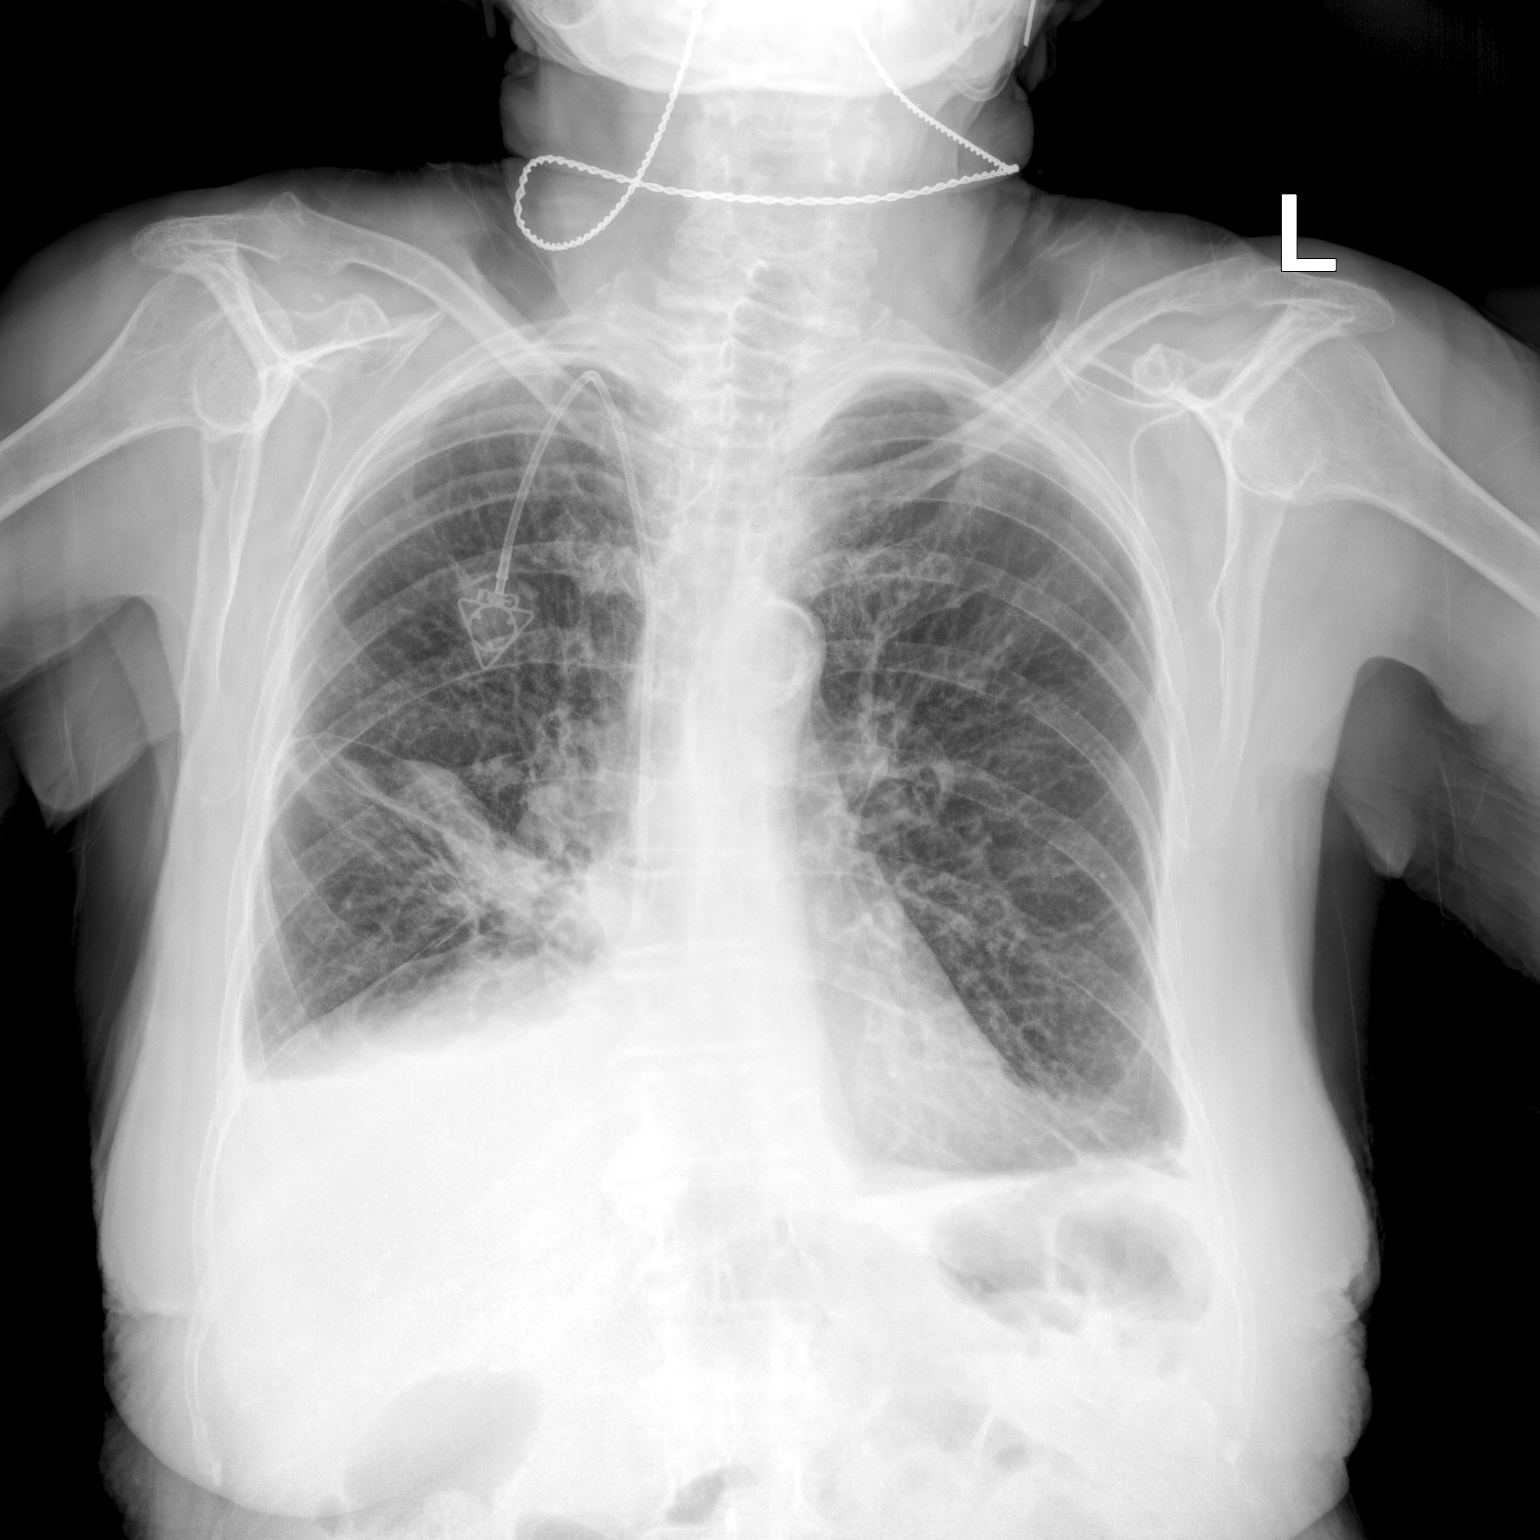

[chest lat]
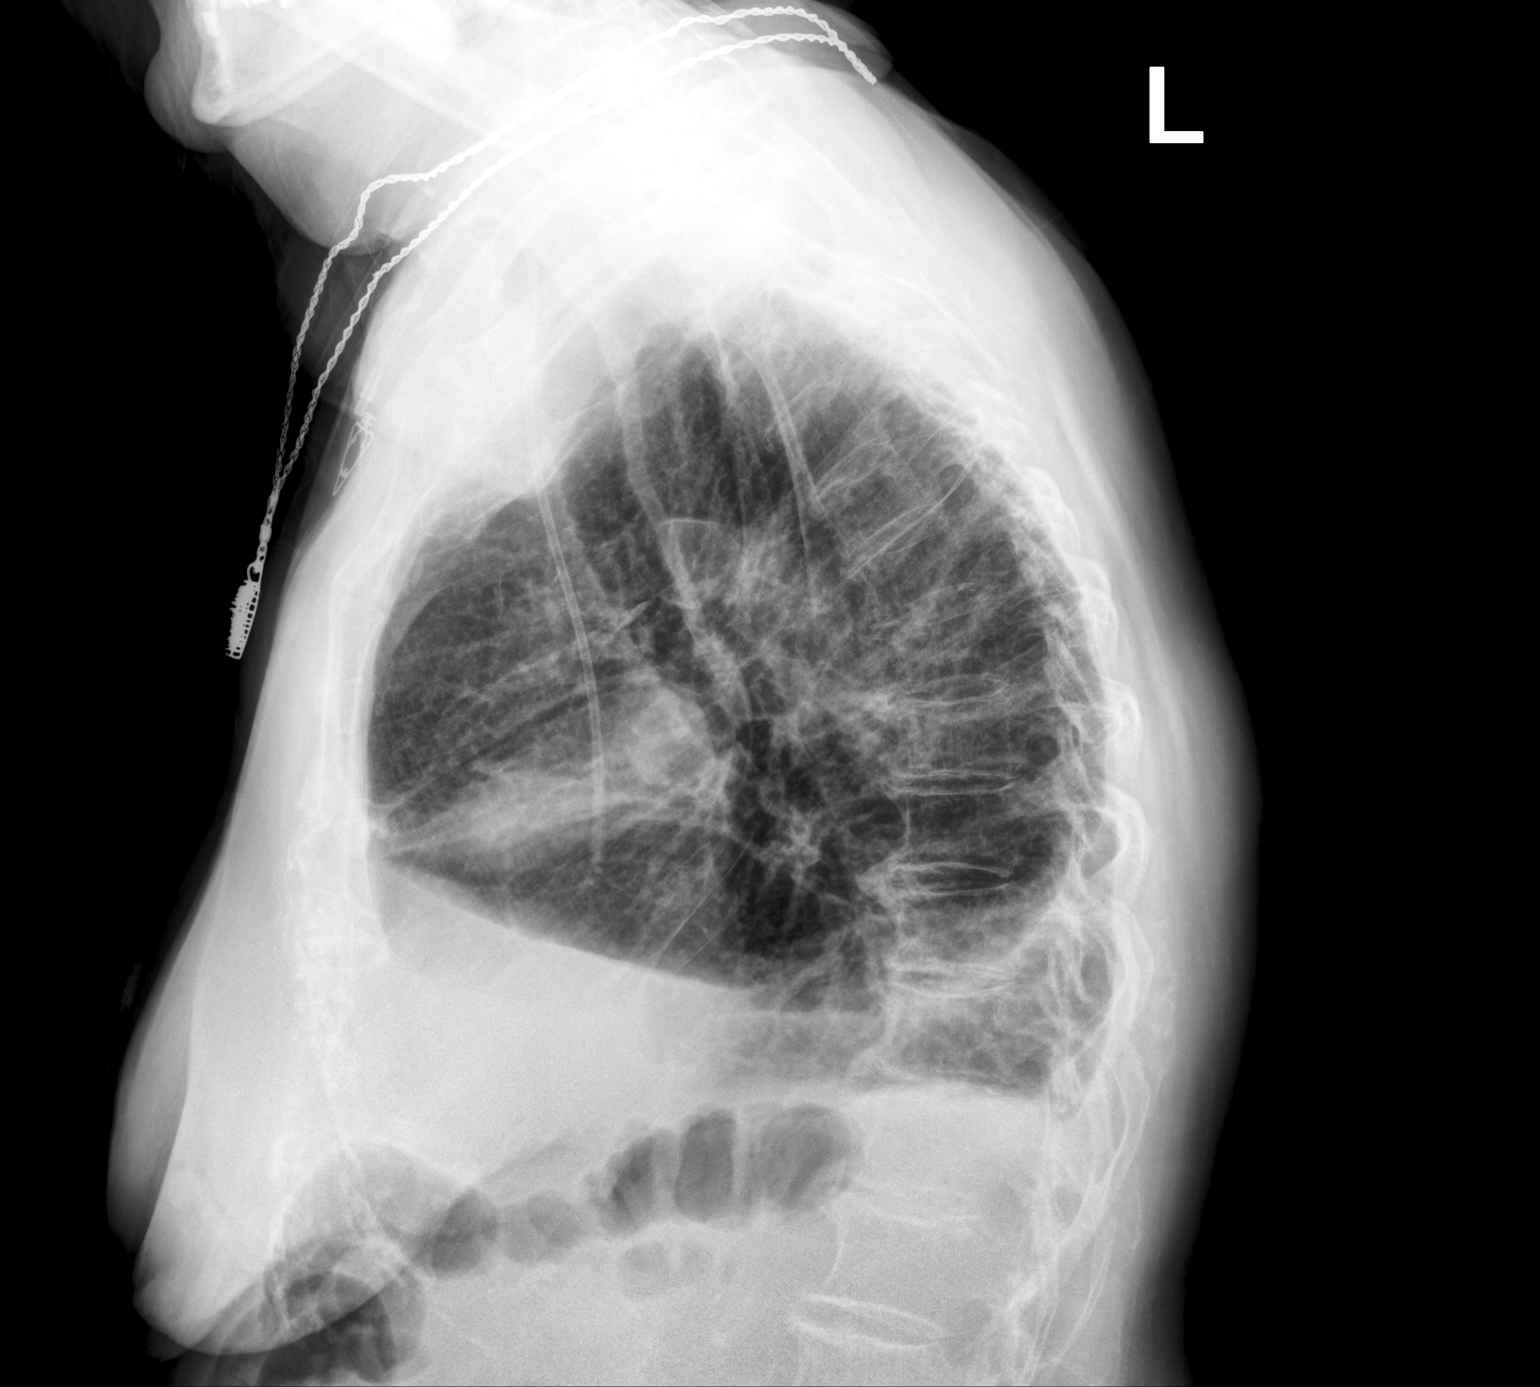

[2 of 2 positions shown; findings below may reference images not displayed]

FINDINGS: RIGHT jugular Port-A-Cath with tip projecting over cavoatrial
junction.

Normal heart size, mediastinal contours, and pulmonary vascularity.

Atherosclerotic calcification aorta.

Pleural effusion and subsegmental atelectasis at RIGHT base.

Tiny pleural effusion and minimal LEFT basilar atelectasis.

Underlying emphysematous changes.

No acute infiltrate or pneumothorax.

Bones demineralized.
IMPRESSION: Emphysematous changes with bibasilar effusions and atelectasis
greater on RIGHT.

Aortic Atherosclerosis ([id number]-JKA.A).
